# Patient Record
Sex: Female | Born: 1940 | Race: White | Hispanic: No | Marital: Married | Smoking: Never smoker
Health system: Southern US, Community
[De-identification: ages and names within clinical notes are randomized; demographics above are authoritative.]

## PROBLEM LIST (undated history)

## (undated) DIAGNOSIS — M899 Disorder of bone, unspecified: Secondary | ICD-10-CM

## (undated) DIAGNOSIS — T7840XA Allergy, unspecified, initial encounter: Secondary | ICD-10-CM

## (undated) DIAGNOSIS — F329 Major depressive disorder, single episode, unspecified: Secondary | ICD-10-CM

## (undated) DIAGNOSIS — M949 Disorder of cartilage, unspecified: Secondary | ICD-10-CM

## (undated) DIAGNOSIS — R32 Unspecified urinary incontinence: Secondary | ICD-10-CM

## (undated) DIAGNOSIS — E785 Hyperlipidemia, unspecified: Secondary | ICD-10-CM

## (undated) DIAGNOSIS — E119 Type 2 diabetes mellitus without complications: Secondary | ICD-10-CM

## (undated) DIAGNOSIS — M8580 Other specified disorders of bone density and structure, unspecified site: Secondary | ICD-10-CM

## (undated) DIAGNOSIS — E669 Obesity, unspecified: Secondary | ICD-10-CM

## (undated) DIAGNOSIS — F3289 Other specified depressive episodes: Secondary | ICD-10-CM

## (undated) DIAGNOSIS — M170 Bilateral primary osteoarthritis of knee: Secondary | ICD-10-CM

## (undated) DIAGNOSIS — H3530 Unspecified macular degeneration: Secondary | ICD-10-CM

## (undated) DIAGNOSIS — K5790 Diverticulosis of intestine, part unspecified, without perforation or abscess without bleeding: Secondary | ICD-10-CM

## (undated) HISTORY — DX: Type 2 diabetes mellitus without complications: E11.9

## (undated) HISTORY — DX: Allergy, unspecified, initial encounter: T78.40XA

## (undated) HISTORY — DX: Other specified disorders of bone density and structure, unspecified site: M85.80

## (undated) HISTORY — DX: Hyperlipidemia, unspecified: E78.5

## (undated) HISTORY — DX: Diverticulosis of intestine, part unspecified, without perforation or abscess without bleeding: K57.90

## (undated) HISTORY — DX: Major depressive disorder, single episode, unspecified: F32.9

## (undated) HISTORY — DX: Unspecified macular degeneration: H35.30

## (undated) HISTORY — DX: Disorder of bone, unspecified: M89.9

## (undated) HISTORY — DX: Bilateral primary osteoarthritis of knee: M17.0

## (undated) HISTORY — DX: Disorder of cartilage, unspecified: M94.9

## (undated) HISTORY — PX: FRACTURE SURGERY: SHX138

## (undated) HISTORY — DX: Obesity, unspecified: E66.9

## (undated) HISTORY — DX: Unspecified urinary incontinence: R32

## (undated) HISTORY — PX: BREAST SURGERY: SHX581

## (undated) HISTORY — DX: Other specified depressive episodes: F32.89

---

## 1992-12-14 HISTORY — PX: OTHER SURGICAL HISTORY: SHX169

## 1998-08-28 ENCOUNTER — Encounter: Admission: RE | Admit: 1998-08-28 | Discharge: 1998-11-26 | Payer: Self-pay | Admitting: Family Medicine

## 1999-06-02 ENCOUNTER — Other Ambulatory Visit: Admission: RE | Admit: 1999-06-02 | Discharge: 1999-06-02 | Payer: Self-pay | Admitting: *Deleted

## 1999-08-06 ENCOUNTER — Encounter (INDEPENDENT_AMBULATORY_CARE_PROVIDER_SITE_OTHER): Payer: Self-pay

## 1999-08-06 ENCOUNTER — Other Ambulatory Visit: Admission: RE | Admit: 1999-08-06 | Discharge: 1999-08-06 | Payer: Self-pay | Admitting: *Deleted

## 1999-10-03 ENCOUNTER — Other Ambulatory Visit: Admission: RE | Admit: 1999-10-03 | Discharge: 1999-10-03 | Payer: Self-pay | Admitting: *Deleted

## 2000-05-31 ENCOUNTER — Encounter: Admission: RE | Admit: 2000-05-31 | Discharge: 2000-05-31 | Payer: Self-pay | Admitting: *Deleted

## 2000-05-31 ENCOUNTER — Encounter: Payer: Self-pay | Admitting: *Deleted

## 2000-06-09 ENCOUNTER — Other Ambulatory Visit: Admission: RE | Admit: 2000-06-09 | Discharge: 2000-06-09 | Payer: Self-pay | Admitting: *Deleted

## 2000-10-21 ENCOUNTER — Other Ambulatory Visit: Admission: RE | Admit: 2000-10-21 | Discharge: 2000-10-21 | Payer: Self-pay | Admitting: *Deleted

## 2001-06-01 ENCOUNTER — Encounter: Admission: RE | Admit: 2001-06-01 | Discharge: 2001-06-01 | Payer: Self-pay | Admitting: *Deleted

## 2001-06-01 ENCOUNTER — Encounter: Payer: Self-pay | Admitting: *Deleted

## 2001-06-02 ENCOUNTER — Other Ambulatory Visit: Admission: RE | Admit: 2001-06-02 | Discharge: 2001-06-02 | Payer: Self-pay | Admitting: *Deleted

## 2002-06-02 ENCOUNTER — Encounter: Payer: Self-pay | Admitting: *Deleted

## 2002-06-02 ENCOUNTER — Encounter: Admission: RE | Admit: 2002-06-02 | Discharge: 2002-06-02 | Payer: Self-pay | Admitting: *Deleted

## 2002-06-05 ENCOUNTER — Other Ambulatory Visit: Admission: RE | Admit: 2002-06-05 | Discharge: 2002-06-05 | Payer: Self-pay | Admitting: *Deleted

## 2003-06-04 ENCOUNTER — Encounter: Admission: RE | Admit: 2003-06-04 | Discharge: 2003-06-04 | Payer: Self-pay | Admitting: *Deleted

## 2003-06-04 ENCOUNTER — Encounter: Payer: Self-pay | Admitting: *Deleted

## 2003-06-08 ENCOUNTER — Other Ambulatory Visit: Admission: RE | Admit: 2003-06-08 | Discharge: 2003-06-08 | Payer: Self-pay | Admitting: *Deleted

## 2004-06-05 ENCOUNTER — Encounter: Admission: RE | Admit: 2004-06-05 | Discharge: 2004-06-05 | Payer: Self-pay | Admitting: *Deleted

## 2004-06-09 ENCOUNTER — Other Ambulatory Visit: Admission: RE | Admit: 2004-06-09 | Discharge: 2004-06-09 | Payer: Self-pay | Admitting: *Deleted

## 2004-07-17 ENCOUNTER — Encounter: Payer: Self-pay | Admitting: Internal Medicine

## 2004-07-17 LAB — HM COLONOSCOPY

## 2005-06-09 ENCOUNTER — Encounter: Admission: RE | Admit: 2005-06-09 | Discharge: 2005-06-09 | Payer: Self-pay | Admitting: *Deleted

## 2005-06-10 ENCOUNTER — Other Ambulatory Visit: Admission: RE | Admit: 2005-06-10 | Discharge: 2005-06-10 | Payer: Self-pay | Admitting: *Deleted

## 2006-06-11 ENCOUNTER — Other Ambulatory Visit: Admission: RE | Admit: 2006-06-11 | Discharge: 2006-06-11 | Payer: Self-pay | Admitting: Obstetrics & Gynecology

## 2006-06-14 ENCOUNTER — Encounter: Admission: RE | Admit: 2006-06-14 | Discharge: 2006-06-14 | Payer: Self-pay | Admitting: Obstetrics & Gynecology

## 2006-12-14 HISTORY — PX: OTHER SURGICAL HISTORY: SHX169

## 2006-12-17 ENCOUNTER — Ambulatory Visit (HOSPITAL_COMMUNITY): Admission: RE | Admit: 2006-12-17 | Discharge: 2006-12-17 | Payer: Self-pay | Admitting: Chiropractic Medicine

## 2007-06-15 ENCOUNTER — Other Ambulatory Visit: Admission: RE | Admit: 2007-06-15 | Discharge: 2007-06-15 | Payer: Self-pay | Admitting: Obstetrics & Gynecology

## 2007-07-04 ENCOUNTER — Encounter: Admission: RE | Admit: 2007-07-04 | Discharge: 2007-07-04 | Payer: Self-pay | Admitting: Obstetrics & Gynecology

## 2008-04-18 ENCOUNTER — Encounter: Payer: Self-pay | Admitting: Internal Medicine

## 2008-06-21 ENCOUNTER — Other Ambulatory Visit: Admission: RE | Admit: 2008-06-21 | Discharge: 2008-06-21 | Payer: Self-pay | Admitting: Obstetrics & Gynecology

## 2008-07-04 ENCOUNTER — Encounter: Admission: RE | Admit: 2008-07-04 | Discharge: 2008-07-04 | Payer: Self-pay | Admitting: Obstetrics & Gynecology

## 2008-09-29 ENCOUNTER — Encounter: Payer: Self-pay | Admitting: Internal Medicine

## 2009-05-28 ENCOUNTER — Encounter: Payer: Self-pay | Admitting: Internal Medicine

## 2009-07-10 ENCOUNTER — Encounter: Admission: RE | Admit: 2009-07-10 | Discharge: 2009-07-10 | Payer: Self-pay | Admitting: Obstetrics & Gynecology

## 2010-01-01 ENCOUNTER — Encounter: Payer: Self-pay | Admitting: Internal Medicine

## 2010-04-18 ENCOUNTER — Ambulatory Visit: Payer: Self-pay | Admitting: Internal Medicine

## 2010-04-18 DIAGNOSIS — E1169 Type 2 diabetes mellitus with other specified complication: Secondary | ICD-10-CM

## 2010-04-18 DIAGNOSIS — E119 Type 2 diabetes mellitus without complications: Secondary | ICD-10-CM

## 2010-04-18 DIAGNOSIS — Z9189 Other specified personal risk factors, not elsewhere classified: Secondary | ICD-10-CM | POA: Insufficient documentation

## 2010-04-18 DIAGNOSIS — E785 Hyperlipidemia, unspecified: Secondary | ICD-10-CM

## 2010-04-18 DIAGNOSIS — R32 Unspecified urinary incontinence: Secondary | ICD-10-CM

## 2010-04-20 DIAGNOSIS — F320 Major depressive disorder, single episode, mild: Secondary | ICD-10-CM

## 2010-04-20 DIAGNOSIS — M8580 Other specified disorders of bone density and structure, unspecified site: Secondary | ICD-10-CM

## 2010-04-21 ENCOUNTER — Encounter: Payer: Self-pay | Admitting: Internal Medicine

## 2010-04-29 ENCOUNTER — Encounter: Payer: Self-pay | Admitting: Internal Medicine

## 2010-05-19 ENCOUNTER — Encounter: Admission: RE | Admit: 2010-05-19 | Discharge: 2010-05-19 | Payer: Self-pay | Admitting: Internal Medicine

## 2010-05-20 ENCOUNTER — Encounter: Payer: Self-pay | Admitting: Internal Medicine

## 2010-05-28 ENCOUNTER — Ambulatory Visit: Payer: Self-pay | Admitting: Internal Medicine

## 2010-05-28 LAB — CONVERTED CEMR LAB
AST: 36 units/L (ref 0–37)
Alkaline Phosphatase: 78 units/L (ref 39–117)
Bilirubin, Direct: 0.1 mg/dL (ref 0.0–0.3)
Cholesterol: 197 mg/dL (ref 0–200)
Hgb A1c MFr Bld: 6.4 % (ref 4.6–6.5)
Total Protein: 6.4 g/dL (ref 6.0–8.3)
VLDL: 33 mg/dL (ref 0.0–40.0)

## 2010-07-11 ENCOUNTER — Encounter: Admission: RE | Admit: 2010-07-11 | Discharge: 2010-07-11 | Payer: Self-pay | Admitting: Internal Medicine

## 2010-09-02 ENCOUNTER — Ambulatory Visit: Payer: Self-pay | Admitting: Internal Medicine

## 2010-09-03 LAB — CONVERTED CEMR LAB: Hgb A1c MFr Bld: 6.3 % (ref 4.6–6.5)

## 2010-12-02 ENCOUNTER — Ambulatory Visit: Payer: Self-pay | Admitting: Internal Medicine

## 2010-12-02 DIAGNOSIS — M542 Cervicalgia: Secondary | ICD-10-CM | POA: Insufficient documentation

## 2010-12-02 LAB — CONVERTED CEMR LAB: Hgb A1c MFr Bld: 6.1 % (ref 4.6–6.5)

## 2011-01-11 LAB — CONVERTED CEMR LAB
ALT: 25 units/L
ALT: 30 units/L
AST: 25 units/L
AST: 32 units/L
Albumin: 3.4 g/dL
Alkaline Phosphatase: 94 units/L
BUN: 14 mg/dL
BUN: 16 mg/dL
BUN: 17 mg/dL
CO2: 23 meq/L
Chloride: 107 meq/L
Cholesterol: 188 mg/dL
Cholesterol: 193 mg/dL
Creatinine, Ser: 0.7 mg/dL
Glucose, Bld: 114 mg/dL
Glucose, Bld: 97 mg/dL
Glucose, Bld: 99 mg/dL
HCT: 41.9 %
HDL: 47 mg/dL
Hgb A1c MFr Bld: 5.9 %
Hgb A1c MFr Bld: 6.3 %
LDL Cholesterol: 104 mg/dL
MCV: 92.8 fL
Platelets: 257 10*3/uL
Potassium: 4.4 meq/L
Potassium: 4.6 meq/L
RDW: 13 %
Sodium: 13 meq/L
Sodium: 141 meq/L
TSH: 0.96 microintl units/mL
Total Bilirubin: 0.4 mg/dL
Total Bilirubin: 3.4 mg/dL
Triglyceride fasting, serum: 182 mg/dL
Triglyceride fasting, serum: 229 mg/dL
WBC: 6.3 10*3/uL

## 2011-01-15 NOTE — Assessment & Plan Note (Signed)
Summary: 6 WK F/U // # / CD   Vital Signs:  Patient profile:   70 year old female Height:      59 inches (149.86 cm) Weight:      188.8 pounds (85.82 kg) O2 Sat:      96 % on Room air Temp:     97.7 degrees F (36.50 degrees C) oral Pulse rate:   85 / minute BP sitting:   120 / 78  (left arm) Cuff size:   large  Vitals Entered By: Orlan Leavens (May 28, 2010 10:09 AM)  O2 Flow:  Room air CC: 6 week f/u Is Patient Diabetic? Yes Did you bring your meter with you today? No Pain Assessment Patient in pain? no        Primary Care Provider:  Newt Lukes MD  CC:  6 week f/u.  History of Present Illness: here for f/u  1) diabetes -  home cbg log reviewed - has been checking sugars q AM - range 110-160s fasting prior a1c = 6.5 watches diet but always heavy - "[facility] weight watchers" had been to nutrtionist and has 2 more appts  2) dyslipidemia - reports compliance with ongoing medical treatment and no changes in medication dose or frequency. denies adverse side effects related to current therapy.  no muscle or GI upset  3) depression - reports compliance with ongoing medical treatment and no changes in medication dose or frequency. denies adverse side effects related to current therapy. no si/hi  4) osteopenia -reports compliance with ongoing medical treatment and no changes in medication dose or frequency. denies adverse side effects related to current therapy.    Clinical Review Panels:  Prevention   Last Mammogram:  No specific mammographic evidence of malignancy.  Assessment: BIRADS 1. (07/10/2009)   Last Pap Smear:  Interpretation Result:Negative for intraepithelial Lesion or Malignancy.    (05/14/2009)   [facility]:  [facility]  Results: Diverticulosis.        (07/17/2004)  Immunizations   Last Tetanus Booster:  Tdap (04/18/2010)   Last Flu Vaccine:  Historical (09/13/2009)   Last Pneumovax:  Historical (07/18/2008)   Last Zoster Vaccine:  Zostavax (09/13/2008)  Lipid Management   Cholesterol:  188 (05/28/2009)   LDL (bad choesterol):  95 (05/28/2009)   HDL (good cholesterol):  47 (05/28/2009)   Triglycerides:  229 (05/28/2009)  Diabetes Management   HgBA1C:  6.3 (05/28/2009)   Creatinine:  0.7 (01/01/2010)   Last Flu Vaccine:  Historical (09/13/2009)   Last Pneumovax:  Historical (07/18/2008)  CBC   WBC:  6.3 (09/29/2008)   RBC:  4.51 (09/29/2008)   Hgb:  13.5 (09/29/2008)   Hct:  41.9 (09/29/2008)   Platelets:  257 (09/29/2008)   MCV  92.8 (09/29/2008)   RDW  13.0 (09/29/2008)  Complete Metabolic Panel   Glucose:  99 (01/01/2010)   Sodium:  141 (01/01/2010)   Potassium:  4.4 (01/01/2010)   Chloride:  107 (01/01/2010)   CO2:  23 (01/01/2010)   BUN:  17 (01/01/2010)   Creatinine:  0.7 (01/01/2010)   Albumin:  3.4 (01/01/2010)   Total Protein:  7.1 (01/01/2010)   Calcium:  9.9 (01/01/2010)   Total Bili:  3.4 (01/01/2010)   Alk Phos:  75 (01/01/2010)   SGPT (ALT):  30 (01/01/2010)   SGOT (AST):  33 (01/01/2010)   Current Medications (verified): 1)  Actonel 5 Mg Tabs (Risedronate Sodium) .... Take 1 By Mouth Qd 2)  Zoloft 50  Mg Tabs (Sertraline Hcl) .... Take 1 By Mouth Qd 3)  Zocor 20 Mg Tabs (Simvastatin) .... Take 1 By Mouth Once Daily 4)  I-Caps .... Take 1 By Mouth Qd 5)  Multivitamins  Tabs (Multiple Vitamin) .... Take 1 By Mouth Qd 6)  Caltrate 600+d 600-400 Mg-Unit Tabs (Calcium Carbonate-Vitamin D) .... Take T Three Times A Day 7)  Vitamin D 2000 Unit Caps (Cholecalciferol) .... Take 1 Po Once Daily  Allergies (verified): 1)  ! Sulfa 2)  ! Penicillin 3)  ! * Lamisil  Past History:  Past Medical History: Hyperlipidemia obesity diabetes mellitus, type 2 depression osteopenia  MD roster: GI : [facility] - [facility] gyn -miller - every aug  Family History: Reviewed history from 04/29/2010 and no changes required. Family History Diabetes 1st degree relative  (parent, grandparent) Family History High cholesterol (parent) Family History Hypertension (parent) Heart disease (parent, grandparent, other relative) mom - age 13 - prot S defic 1 daughter with M.S.  Social History: Reviewed history from 04/18/2010 and no changes required. Never Smoked lives with female partner (annette hicks) and 68 yo son works as Engineer, structural  Review of Systems  The patient denies weight loss, chest pain, and syncope.         also see HPI above. I have reviewed all other systems and they were negative.   Physical Exam  General:  overweight-appearing.  alert, well-developed, well-nourished, and cooperative to examination.    Lungs:  normal respiratory effort, no intercostal retractions or use of accessory muscles; normal breath sounds bilaterally - no crackles and no wheezes.    Heart:  normal rate, regular rhythm, no murmur, and no rub. BLE without edema. normal DP pulses and normal cap refill in all 4 extremities    Abdomen:  soft, non-tender, normal bowel sounds, no distention; no masses and no appreciable hepatomegaly or splenomegaly.   Psych:  Oriented X3, memory intact for recent and remote, normally interactive, good eye contact, not anxious appearing, not depressed appearing, and not agitated.      Impression & Recommendations:  Problem # 1:  DIABETES MELLITUS, TYPE II (ICD-250.00) dx confirmed - to cont nutrition/dm education as begun check labs now initiate tx -advised on risk/benefit and poss SE - to call if problems Her updated medication list for this problem includes:    Metformin Hcl 500 Mg Xr24h-tab (Metformin hcl) .Marland Kitchen... 1 by mouth two times a day  Orders: Prescription Created Electronically [id number]) TLB-A1C / Hgb A1C (Glycohemoglobin) (83036-A1C) TLB-Microalbumin/Creat Ratio, Urine (82043-MALB)  Labs Reviewed: Creat: 0.7 (01/01/2010)    Reviewed HgBA1c results: 6.3 (05/28/2009)  5.9 (09/29/2008)  Problem # 2:   HYPERLIPIDEMIA (ICD-272.4)  Her updated medication list for this problem includes:    Zocor 20 Mg Tabs (Simvastatin) .Marland Kitchen... Take 1 by mouth once daily  Orders: TLB-Lipid Panel (80061-LIPID)  Labs Reviewed: SGOT: 33 (01/01/2010)   SGPT: 30 (01/01/2010)   HDL:47 (05/28/2009), 53 (09/29/2008)  LDL:95 (05/28/2009), 104 (47/82/9562)  Chol:188 (05/28/2009), 193 (09/29/2008)  Trig:229 (05/28/2009), 182 (09/29/2008)  Problem # 3:  OSTEOPENIA (ICD-733.90)  Her updated medication list for this problem includes:    Actonel 5 Mg Tabs (Risedronate sodium) .Marland Kitchen... Take 1 by mouth qd  Orders: T-Vitamin D (25-Hydroxy) (13086-57846)  Discussed medication use, applications of heat or ice, and exercises.    Time spent with patient 45 minutes, more than 50% of this time was spent counseling patient on diabetes, obesity and problems concerning the need for nutrition education and exercise  Problem # 4:  OBESITY, UNSPECIFIED (ICD-278.00)  Ht: 59 (05/28/2010)   Wt: 188.8 (05/28/2010)   BMI: 38.19 (04/18/2010)  Ht: 59 (04/18/2010)   Wt: 188.4 (04/18/2010)   BMI: 38.19 (04/18/2010)  Complete Medication List: 1)  Actonel 5 Mg Tabs (Risedronate sodium) .... Take 1 by mouth qd 2)  Zoloft 50 Mg Tabs (Sertraline hcl) .... Take 1 by mouth qd 3)  Zocor 20 Mg Tabs (Simvastatin) .... Take 1 by mouth once daily 4)  I-caps  .... Take 1 by mouth qd 5)  Multivitamins Tabs (Multiple vitamin) .... Take 1 by mouth qd 6)  Caltrate 600+d 600-400 Mg-unit Tabs (Calcium carbonate-vitamin d) .... Take t three times a day 7)  Vitamin D 2000 Unit Caps (Cholecalciferol) .... Take 1 po once daily 8)  Metformin Hcl 500 Mg Xr24h-tab (Metformin hcl) .Marland Kitchen.. 1 by mouth two times a day  Other Orders: TLB-Hepatic/Liver Function Pnl (80076-HEPATIC)  Patient Instructions: 1)  it was good to see you today. 2)  prior labs reviewed and more test(s) ordered today - your results will be posted on the phone tree for review in 48-72 hours  from the time of test completion; call [phone] and enter your 9 digit MRN (listed above on this page, just below your name); if any changes need to be made or there are abnormal results, you will be contacted directly.  3)  start metformin as discussed for diabetes - your prescription has been electronically submitted to your pharmacy. Please take as directed. Contact our office if you believe you're having problems with the medication(s).  4)  continue to monitor your sugars each AM before breakfast - call if any sugars over 200 - 5)  Please schedule a follow-up appointment in 3 months, sooner if problems.  Prescriptions: METFORMIN HCL 500 MG XR24H-TAB (METFORMIN HCL) 1 by mouth two times a day  #60 x 3   Entered and Authorized by:   Newt Lukes MD   Signed by:   Newt Lukes MD on 05/28/2010   Method used:   Electronically to        [facility]. [phone]* (retail)       [address]       [place], [place]  [address]       Ph: [phone]       Fax: [phone]   RxID:   [id number]

## 2011-01-15 NOTE — Assessment & Plan Note (Signed)
Summary: new / bcbs / # cd   Vital Signs:  Patient profile:   70 year old female Height:      59 inches (149.86 cm) Weight:      188.4 pounds (85.64 kg) BMI:     38.19 O2 Sat:      97 % on Room air Temp:     97.6 degrees F (36.44 degrees C) oral Pulse rate:   93 / minute BP sitting:   120 / 68  (left arm) Cuff size:   regular  Vitals Entered By: Orlan Leavens (Apr 18, 2010 3:12 PM)  O2 Flow:  Room air CC: New patient Is Patient Diabetic? No Pain Assessment Patient in pain? no        Primary Care Provider:  Newt Lukes MD  CC:  New patient.  History of Present Illness: new pt to me and our practice, here ot estt care prev followed with tisovek at [facility]  1) hyperglycemia - ?DM  has been checking sugars at home - range 110-160s in am fasting prior a1c = 6.5 watches diet but always heavy - "[facility] weight watchers"  2) dyslipidemia - reports compliance with ongoing medical treatment and no changes in medication dose or frequency. denies adverse side effects related to current therapy.  no muscle or GI upset  3) depression - reports compliance with ongoing medical treatment and no changes in medication dose or frequency. denies adverse side effects related to current therapy.   no si/hi  4) osteopenia - last bone density 2 years? - reports compliance with ongoing medical treatment and no changes in medication dose or frequency. denies adverse side effects related to current therapy.    Preventive Screening-Counseling & Management  Alcohol-Tobacco     Alcohol drinks/day: 0     Alcohol Counseling: not indicated; patient does not drink     Smoking Status: never     Tobacco Counseling: not indicated; no tobacco use  Caffeine-Diet-Exercise     Nutrition Referrals: yes     Exercise Counseling: to improve exercise regimen     Depression Counseling: not indicated; screening negative for depression  Safety-Violence-Falls     Seat Belt Counseling: not indicated;  patient wears seat belts     Helmet Counseling: not indicated; patient wears helmet when riding bicycle/motocycle     Firearm Counseling: not applicable     Violence Counseling: not indicated; no violence risk noted     Sexual Abuse Counseling: no     Fall Risk Counseling: not indicated; no significant falls noted  Clinical Review Panels:  Prevention   Last Mammogram:  No specific mammographic evidence of malignancy.  Assessment: BIRADS 1. (07/10/2009)   Last Pap Smear:  Interpretation Result:Negative for intraepithelial Lesion or Malignancy.    (05/14/2009)   Last Colonoscopy:  Location:  [facility].  Results: Diverticulosis.        (07/17/2004)  Immunizations   Last Tetanus Booster:  Tdap (04/18/2010)   Last Flu Vaccine:  Historical (09/13/2009)   Last Pneumovax:  Historical (07/18/2008)   Last Zoster Vaccine:  Zostavax (09/13/2008)   Current Medications (verified): 1)  Actonel 5 Mg Tabs (Risedronate Sodium) .... Take 1 By Mouth Qd 2)  Zoloft 50 Mg Tabs (Sertraline Hcl) .... Take 1 By Mouth Qd 3)  Zocor 20 Mg Tabs (Simvastatin) .... Take 1 By Mouth Once Daily 4)  I-Caps .... Take 1 By Mouth Qd 5)  Multivitamins  Tabs (Multiple Vitamin) .... Take 1 By Mouth  Qd 6)  Caltrate 600+d 600-400 Mg-Unit Tabs (Calcium Carbonate-Vitamin D) .... Take T Three Times A Day 7)  Vitamin D 2000 Unit Caps (Cholecalciferol) .... Take 1 Po Once Daily  Allergies (verified): 1)  ! Sulfa 2)  ! Penicillin 3)  ! * Lamisil  Past History:  Past Medical History: Hyperlipidemia obesity diabetes, borderine depression osteopenia  MD rooster: GI : [facility] - [facility]orf gyn -[facility] - every aug  Past Surgical History: Left wrist surgery in 2008 with Dr. Yisroel Ramming Right ankle  Family History: Family History Diabetes 1st degree relative (parent, grandparent) Family History High cholesterol (parent) Family History Hypertension (parent) Heart disease (parent, grandparent, other  relative) mom - age 69 - prot S defic  Social History: Never Smoked lives with female partner (annette hicks) and 35 yo son works as Engineer, structural Smoking Status:  never  Review of Systems       see HPI above. I have reviewed all other systems and they were negative.   Physical Exam  General:  overweight-appearing.  alert, well-developed, well-nourished, and cooperative to examination.    Eyes:  vision grossly intact; pupils equal, round and reactive to light.  conjunctiva and lids normal.    Ears:  normal pinnae bilaterally, without erythema, swelling, or tenderness to palpation. TMs clear, without effusion, or cerumen impaction. Hearing grossly normal bilaterally  Mouth:  teeth and gums in good repair; mucous membranes moist, without lesions or ulcers. oropharynx clear without exudate, erythema.  Neck:  thick, supple, full ROM, no masses, no thyromegaly; no thyroid nodules or tenderness. no JVD or carotid bruits.   Lungs:  normal respiratory effort, no intercostal retractions or use of accessory muscles; normal breath sounds bilaterally - no crackles and no wheezes.    Heart:  normal rate, regular rhythm, no murmur, and no rub. BLE without edema. normal DP pulses and normal cap refill in all 4 extremities    Abdomen:  soft, non-tender, normal bowel sounds, no distention; no masses and no appreciable hepatomegaly or splenomegaly.   Genitalia:  defer gyn Msk:  No deformity or scoliosis noted of thoracic or lumbar spine.   Neurologic:  alert & oriented X3 and cranial nerves II-XII symetrically intact.  strength normal in all extremities, sensation intact to light touch, and gait normal. speech fluent without dysarthria or aphasia; follows commands with good comprehension.  Skin:  no rashes, vesicles, ulcers, or erythema. No nodules or irregularity to palpation.  Psych:  Oriented X3, memory intact for recent and remote, normally interactive, good eye contact, not anxious appearing,  not depressed appearing, and not agitated.      Impression & Recommendations:  Problem # 1:  DIABETES MELLITUS, TYPE II (ICD-250.00) meets criteria by review of home fastong home cbgs -  send for prior recorsd - check a1x next OV - refer for nutrition eval and education  Problem # 2:  OBESITY, UNSPECIFIED (ICD-278.00)  Ht: 59 (04/18/2010)   Wt: 188.4 (04/18/2010)   BMI: 38.19 (04/18/2010)  Problem # 3:  HYPERLIPIDEMIA (ICD-272.4) send for prior records and labs - cont same until that time  Her updated medication list for this problem includes:    Zocor 20 Mg Tabs (Simvastatin) .Marland Kitchen... Take 1 by mouth once daily  Problem # 4:  DEPRESSION (ICD-311) Assessment: Unchanged  Her updated medication list for this problem includes:    Zoloft 50 Mg Tabs (Sertraline hcl) .Marland Kitchen... Take 1 by mouth qd  Problem # 5:  OSTEOPENIA (ICD-733.90) send for records - Her  updated medication list for this problem includes:    Actonel 5 Mg Tabs (Risedronate sodium) .Marland Kitchen... Take 1 by mouth qd  Discussed medication use, applications of heat or ice, and exercises.    Time spent with patient 45 minutes, more than 50% of this time was spent counseling patient on diabetes, obesity and problems concerning the need for nutrition education and exercise  Complete Medication List: 1)  Actonel 5 Mg Tabs (Risedronate sodium) .... Take 1 by mouth qd 2)  Zoloft 50 Mg Tabs (Sertraline hcl) .... Take 1 by mouth qd 3)  Zocor 20 Mg Tabs (Simvastatin) .... Take 1 by mouth once daily 4)  I-caps  .... Take 1 by mouth qd 5)  Multivitamins Tabs (Multiple vitamin) .... Take 1 by mouth qd 6)  Caltrate 600+d 600-400 Mg-unit Tabs (Calcium carbonate-vitamin d) .... Take t three times a day 7)  Vitamin D 2000 Unit Caps (Cholecalciferol) .... Take 1 po once daily  Other Orders: Tdap => 45yrs IM (08657) Admin 1st Vaccine (84696) Nutrition Referral (Nutrition)  Patient Instructions: 1)  it was good to see you today. 2)  will send  for record from [facility]k to review -  3)  we'll make referral to nutritionist for weight and diabetes education. Our office will contact you regarding this appointment once made.  4)  continue to monitor your sugars each AM before breakfast - call if any sugars over 200 - 5)  Please schedule a follow-up appointment in 6 weeks, sooner if problems.    Immunization History:  Influenza Immunization History:    Influenza:  historical (09/13/2009)  Pneumovax Immunization History:    Pneumovax:  historical (07/18/2008)  Zostavax History:    Zostavax # 1:  zostavax (09/13/2008)  Immunizations Administered:  Tetanus Vaccine:    Vaccine Type: Tdap    Site: left deltoid    Mfr: [employer]oSmithK[employer]    Dose: 0.5 ml    Route: IM    Given by: Orlan Leavens    Exp. Date: 03/07/2012    Lot #: [id number]    VIS given: 04/18/10    Mammogram  Procedure date:  07/10/2009  Findings:      No specific mammographic evidence of malignancy.  Assessment: BIRADS 1.   Pap Smear  Procedure date:  05/14/2009  Findings:      Interpretation Result:Negative for intraepithelial Lesion or Malignancy.     Colonoscopy  Procedure date:  07/17/2004  Findings:      Location:  [facility].  Results: Diverticulosis.

## 2011-01-15 NOTE — Letter (Signed)
Summary: Pt's Hx/[facility]  Pt's Hx/[facility]   Imported By: Sherian Rein 05/05/2010 15:15:11  _____________________________________________________________________  External Attachment:    Type:   Image     Comment:   External Document

## 2011-01-15 NOTE — Letter (Signed)
Summary: [facility]  [facility]   Imported By: Sherian Rein 05/05/2010 15:10:15  _____________________________________________________________________  External Attachment:    Type:   Image     Comment:   External Document

## 2011-01-15 NOTE — Assessment & Plan Note (Signed)
Summary: 3 mo rov /nws   Vital Signs:  Patient profile:   70 year old female Height:      59 inches (149.86 cm) Weight:      190.4 pounds (86.55 kg) O2 Sat:      95 % on Room air Temp:     98.4 degrees F (36.89 degrees C) oral Pulse rate:   76 / minute BP sitting:   122 / 76  (left arm) Cuff size:   large  Vitals Entered By: Orlan Leavens RMA (December 02, 2010 3:53 PM)  O2 Flow:  Room air CC: 3 month follow-up Is Patient Diabetic? Yes Did you bring your meter with you today? No Pain Assessment Patient in pain? no        Primary Care Devinn Voshell:  Newt Lukes MD  CC:  3 month follow-up.  History of Present Illness: here for f/u  1) diabetes -  home cbg log reviewed - has been checking sugars q AM - range 80s-120s fasting watches diet but always heavy - "life member weight watchers" had been to nutrtionist and eating more veggies reports compliance with ongoing medical treatment and no changes in medication dose or frequency. denies adverse side effects related to current therapy. ?diarrhea since starting atelvia vs metfor  2) dyslipidemia - reports compliance with ongoing medical treatment and no changes in medication dose or frequency. denies adverse side effects related to current therapy.  no muscle or GI upset  3) depression - reports compliance with ongoing medical treatment and no changes in medication dose or frequency. denies adverse side effects related to current therapy. no si/hi  4) osteopenia -reports compliance with ongoing medical treatment and changes from actonel to atelvia. denies adverse side effects related to current therapy.    Clinical Review Panels:  Prevention   Last Mammogram:  ASSESSMENT: Negative - BI-RADS 1^MM DIGITAL SCREENING (07/11/2010)   Last Pap Smear:  Interpretation Result:Negative for intraepithelial Lesion or Malignancy.    (05/14/2009)   Last Colonoscopy:  Location:  [facility].  Results: Diverticulosis.          (07/17/2004)  Diabetes Management   HgBA1C:  6.3 (09/02/2010)   Creatinine:  0.7 (01/01/2010)   Last Flu Vaccine:  Fluvax 3+ (09/02/2010)   Last Pneumovax:  Historical (07/18/2008)  CBC   WBC:  6.3 (09/29/2008)   RBC:  4.51 (09/29/2008)   Hgb:  13.5 (09/29/2008)   Hct:  41.9 (09/29/2008)   Platelets:  257 (09/29/2008)   MCV  92.8 (09/29/2008)   RDW  13.0 (09/29/2008)  Complete Metabolic Panel   Glucose:  99 (01/01/2010)   Sodium:  141 (01/01/2010)   Potassium:  4.4 (01/01/2010)   Chloride:  107 (01/01/2010)   CO2:  23 (01/01/2010)   BUN:  17 (01/01/2010)   Creatinine:  0.7 (01/01/2010)   Albumin:  3.6 (05/28/2010)   Total Protein:  6.4 (05/28/2010)   Calcium:  9.9 (01/01/2010)   Total Bili:  0.7 (05/28/2010)   Alk Phos:  78 (05/28/2010)   SGPT (ALT):  28 (05/28/2010)   SGOT (AST):  36 (05/28/2010)   Current Medications (verified): 1)  Zoloft 50 Mg Tabs (Sertraline Hcl) .... Take 1 By Mouth Qd 2)  Zocor 20 Mg Tabs (Simvastatin) .... Take 1 By Mouth Once Daily 3)  I-Caps .... Take 1 By Mouth Qd 4)  Multivitamins  Tabs (Multiple Vitamin) .... Take 1 By Mouth Qd 5)  Caltrate 600+d 600-400 Mg-Unit Tabs (Calcium Carbonate-Vitamin D) .... Take T  Three Times A Day 6)  Vitamin D 2000 Unit Caps (Cholecalciferol) .... Take 1 Po Once Daily 7)  Metformin Hcl 500 Mg Xr24h-Tab (Metformin Hcl) .Marland Kitchen.. 1 By Mouth Two Times A Day 8)  Freestyle Test  Strp (Glucose Blood) .... Check Blood Sugar Two Times A Day 9)  Freestyle Lancets  Misc (Lancets) .... Use Check Blood Sugar Two Times A Day 10)  Atelvia 35 Mg Tbec (Risedronate Sodium) .... Take 1 Once A Week 11)  Advil 200 Mg Tabs (Ibuprofen) .... Use As Needed  Allergies (verified): 1)  ! Sulfa 2)  ! Penicillin 3)  ! * Lamisil  Past History:  Past Medical History: Hyperlipidemia obesity diabetes mellitus, type 2 depression   osteopenia  MD roster:  GI : brodie ortho - daldorf gyn - miller - every aug  Review of  Systems  The patient denies weight loss, chest pain, headaches, and abdominal pain.         c/o right shoulder pain, better lifting UE over head  Physical Exam  General:  overweight-appearing.  alert, well-developed, well-nourished, and cooperative to examination.    Lungs:  normal respiratory effort, no intercostal retractions or use of accessory muscles; normal breath sounds bilaterally - no crackles and no wheezes.    Heart:  normal rate, regular rhythm, no murmur, and no rub. BLE without edema. Neurologic:  alert & oriented X3 and cranial nerves II-XII symetrically intact.  strength normal in all extremities, sensation intact to light touch, and gait normal. speech fluent without dysarthria or aphasia; follows commands with good comprehension.    Impression & Recommendations:  Problem # 1:  DIABETES MELLITUS, TYPE II (ICD-250.00)  Her updated medication list for this problem includes:    Metformin Hcl 500 Mg Xr24h-tab (Metformin hcl) .Marland Kitchen... 1 by mouth two times a day  Orders: TLB-A1C / Hgb A1C (Glycohemoglobin) (83036-A1C)  med tx begun 05/2010 - recheck labs now ?diarrhea from this med or atelvia - consider alt med if felt to be metformin related  Labs Reviewed: Creat: 0.7 (01/01/2010)    Reviewed HgBA1c results: 6.3 (09/02/2010)  6.4 (05/28/2010)  Problem # 2:  OSTEOPENIA (ICD-733.90)  med change by gyn reviewed - ?diarrhea SE - monitor The following medications were removed from the medication list:    Actonel 5 Mg Tabs (Risedronate sodium) .Marland Kitchen... Take 1 by mouth qd Her updated medication list for this problem includes:    Atelvia 35 Mg Tbec (Risedronate sodium) .Marland Kitchen... Take 1 once a week  Discussed medication use, applications of heat or ice, and exercises.   Problem # 3:  NECK PAIN (ICD-723.1) suspect right side cervical bulge with right shoulder pain improved by lifting arm up to head discussed options for tx - pt prefers coserv mgmt - NSAIDs - avoid  chiropractor consider PT or Nsurg eval/tx Her updated medication list for this problem includes:    Advil 200 Mg Tabs (Ibuprofen) ..... Use as needed  Complete Medication List: 1)  Zoloft 50 Mg Tabs (Sertraline hcl) .... Take 1 by mouth qd 2)  Zocor 20 Mg Tabs (Simvastatin) .... Take 1 by mouth once daily 3)  I-caps  .... Take 1 by mouth qd 4)  Multivitamins Tabs (Multiple vitamin) .... Take 1 by mouth qd 5)  Caltrate 600+d 600-400 Mg-unit Tabs (Calcium carbonate-vitamin d) .... Take t three times a day 6)  Vitamin D 2000 Unit Caps (Cholecalciferol) .... Take 1 po once daily 7)  Metformin Hcl 500 Mg Xr24h-tab (Metformin hcl) .Marland KitchenMarland KitchenMarland Kitchen 1  by mouth two times a day 8)  Freestyle Test Strp (Glucose blood) .... Check blood sugar two times a day 9)  Freestyle Lancets Misc (Lancets) .... Use check blood sugar two times a day 10)  Atelvia 35 Mg Tbec (Risedronate sodium) .... Take 1 once a week 11)  Advil 200 Mg Tabs (Ibuprofen) .... Use as needed  Patient Instructions: 1)  it was good to see you today. 2)  test(s) ordered today - your results will be posted on the phone tree for review in 48-72 hours from the time of test completion; call [phone] and enter your 9 digit MRN (listed above on this page, just below your name); if any changes need to be made or there are abnormal results, you will be contacted directly.  3)  samples of atelvia provided today -  4)  call if neck/arm pain is getting worse rather than better to consider physical therapy or neurosurg evaluation for other treatment options 5)  continue to watch your weight, exercise and diet 6)  Please schedule a follow-up appointment in 3 months to monitor diabetes, sooner if problems.    Orders Added: 1)  TLB-A1C / Hgb A1C (Glycohemoglobin) [83036-A1C] 2)  Est. Patient Level IV [09811]

## 2011-01-15 NOTE — Assessment & Plan Note (Signed)
Summary: 3 MO FU/LD   Vital Signs:  Patient profile:   70 year old female Height:      59 inches (149.86 cm) Weight:      189.9 pounds (86.32 kg) O2 Sat:      95 % on Room air Temp:     98.3 degrees F (36.83 degrees C) oral Pulse rate:   84 / minute BP sitting:   124 / 82  (left arm) Cuff size:   regular  Vitals Entered By: Orlan Leavens RMA (September 02, 2010 3:55 PM)  O2 Flow:  Room air CC: 3 MONTH FOLLOW-UP Is Patient Diabetic? Yes Did you bring your meter with you today? Yes Pain Assessment Patient in pain? no      Comments NEED REFILL ON MEDS ASLO WANT FLU SHOT   Primary Care Provider:  Newt Lukes MD  CC:  3 MONTH FOLLOW-UP.  History of Present Illness: here for f/u  1) diabetes -  home cbg log reviewed - has been checking sugars q AM - range 90-120ss fasting prior a1c = 6.5 watches diet but always heavy - "life member weight watchers" had been to nutrtionist and eating more veggies  2) dyslipidemia - reports compliance with ongoing medical treatment and no changes in medication dose or frequency. denies adverse side effects related to current therapy.  no muscle or GI upset  3) depression - reports compliance with ongoing medical treatment and no changes in medication dose or frequency. denies adverse side effects related to current therapy. no si/hi  4) osteopenia -reports compliance with ongoing medical treatment and no changes in medication dose or frequency. denies adverse side effects related to current therapy.    Clinical Review Panels:  Prevention   Last Mammogram:  ASSESSMENT: Negative - BI-RADS 1^MM DIGITAL SCREENING (07/11/2010)   Last Pap Smear:  Interpretation Result:Negative for intraepithelial Lesion or Malignancy.    (05/14/2009)   Last Colonoscopy:  Location:  [facility].  Results: Diverticulosis.        (07/17/2004)  Immunizations   Last Tetanus Booster:  Tdap (04/18/2010)   Last Flu Vaccine:  Fluvax 3+  (09/02/2010)   Last Pneumovax:  Historical (07/18/2008)   Last Zoster Vaccine:  Zostavax (09/13/2008)  Lipid Management   Cholesterol:  197 (05/28/2010)   LDL (bad choesterol):  107 (05/28/2010)   HDL (good cholesterol):  57.10 (05/28/2010)   Triglycerides:  229 (05/28/2009)  Diabetes Management   HgBA1C:  6.4 (05/28/2010)   Creatinine:  0.7 (01/01/2010)   Last Flu Vaccine:  Fluvax 3+ (09/02/2010)   Last Pneumovax:  Historical (07/18/2008)  CBC   WBC:  6.3 (09/29/2008)   RBC:  4.51 (09/29/2008)   Hgb:  13.5 (09/29/2008)   Hct:  41.9 (09/29/2008)   Platelets:  257 (09/29/2008)   MCV  92.8 (09/29/2008)   RDW  13.0 (09/29/2008)  Complete Metabolic Panel   Glucose:  99 (01/01/2010)   Sodium:  141 (01/01/2010)   Potassium:  4.4 (01/01/2010)   Chloride:  107 (01/01/2010)   CO2:  23 (01/01/2010)   BUN:  17 (01/01/2010)   Creatinine:  0.7 (01/01/2010)   Albumin:  3.6 (05/28/2010)   Total Protein:  6.4 (05/28/2010)   Calcium:  9.9 (01/01/2010)   Total Bili:  0.7 (05/28/2010)   Alk Phos:  78 (05/28/2010)   SGPT (ALT):  28 (05/28/2010)   SGOT (AST):  36 (05/28/2010)   Current Medications (verified): 1)  Actonel 5 Mg Tabs (Risedronate Sodium) .... Take 1 By Mouth  Qd 2)  Zoloft 50 Mg Tabs (Sertraline Hcl) .... Take 1 By Mouth Qd 3)  Zocor 20 Mg Tabs (Simvastatin) .... Take 1 By Mouth Once Daily 4)  I-Caps .... Take 1 By Mouth Qd 5)  Multivitamins  Tabs (Multiple Vitamin) .... Take 1 By Mouth Qd 6)  Caltrate 600+d 600-400 Mg-Unit Tabs (Calcium Carbonate-Vitamin D) .... Take T Three Times A Day 7)  Vitamin D 2000 Unit Caps (Cholecalciferol) .... Take 1 Po Once Daily 8)  Metformin Hcl 500 Mg Xr24h-Tab (Metformin Hcl) .Marland Kitchen.. 1 By Mouth Two Times A Day  Allergies (verified): 1)  ! Sulfa 2)  ! Penicillin 3)  ! * Lamisil  Past History:  Past Medical History: Hyperlipidemia obesity diabetes mellitus, type 2 depression  osteopenia  MD roster: GI : brodie ortho -  daldorf gyn -miller - every aug  Social History: Never Smoked lives with female partner (annette hicks) and 28 yo son works as Engineer, structural in school system  Review of Systems  The patient denies fever, chest pain, syncope, and headaches.    Physical Exam  General:  overweight-appearing.  alert, well-developed, well-nourished, and cooperative to examination.    Lungs:  normal respiratory effort, no intercostal retractions or use of accessory muscles; normal breath sounds bilaterally - no crackles and no wheezes.    Heart:  normal rate, regular rhythm, no murmur, and no rub. BLE without edema.   Impression & Recommendations:  Problem # 1:  DIABETES MELLITUS, TYPE II (ICD-250.00)  med tx begun 05/2010 - recheck labs now Her updated medication list for this problem includes:    Metformin Hcl 500 Mg Xr24h-tab (Metformin hcl) .Marland Kitchen... 1 by mouth two times a day  Orders: TLB-A1C / Hgb A1C (Glycohemoglobin) (83036-A1C)  Labs Reviewed: Creat: 0.7 (01/01/2010)    Reviewed HgBA1c results: 6.4 (05/28/2010)  6.3 (05/28/2009)  Problem # 2:  HYPERLIPIDEMIA (ICD-272.4)  Her updated medication list for this problem includes:    Zocor 20 Mg Tabs (Simvastatin) .Marland Kitchen... Take 1 by mouth once daily  Labs Reviewed: SGOT: 36 (05/28/2010)   SGPT: 28 (05/28/2010)   HDL:57.10 (05/28/2010), 47 (05/28/2009)  LDL:107 (05/28/2010), 95 (04/54/0981)  Chol:197 (05/28/2010), 188 (05/28/2009)  Trig:165.0 (05/28/2010), 229 (05/28/2009)  Problem # 3:  OBESITY, UNSPECIFIED (ICD-278.00)  Ht: 59 (05/28/2010)   Wt: 188.8 (05/28/2010)   BMI: 38.19 (04/18/2010)  Ht: 59 (04/18/2010)   Wt: 188.4 (04/18/2010)   BMI: 38.19 (04/18/2010)  Ht: 59 (09/02/2010)   Wt: 189.9 (09/02/2010)   BMI: 38.19 (04/18/2010)  Problem # 4:  DEPRESSION (ICD-311)  Her updated medication list for this problem includes:    Zoloft 50 Mg Tabs (Sertraline hcl) .Marland Kitchen... Take 1 by mouth qd  Discussed treatment options, including  trial of antidpressant medication. Will refer to behavioral health. Follow-up call in in 24-48 hours and recheck in 2 weeks, sooner as needed. Patient agrees to call if any worsening of symptoms or thoughts of doing harm arise. Verified that the patient has no suicidal ideation at this time.   Complete Medication List: 1)  Actonel 5 Mg Tabs (Risedronate sodium) .... Take 1 by mouth qd 2)  Zoloft 50 Mg Tabs (Sertraline hcl) .... Take 1 by mouth qd 3)  Zocor 20 Mg Tabs (Simvastatin) .... Take 1 by mouth once daily 4)  I-caps  .... Take 1 by mouth qd 5)  Multivitamins Tabs (Multiple vitamin) .... Take 1 by mouth qd 6)  Caltrate 600+d 600-400 Mg-unit Tabs (Calcium carbonate-vitamin d) .... Take t three times  a day 7)  Vitamin D 2000 Unit Caps (Cholecalciferol) .... Take 1 po once daily 8)  Metformin Hcl 500 Mg Xr24h-tab (Metformin hcl) .Marland Kitchen.. 1 by mouth two times a day 9)  Freestyle Test Strp (Glucose blood) .... Check blood sugar two times a day 10)  Freestyle Lancets Misc (Lancets) .... Use check blood sugar two times a day  Other Orders: Admin 1st Vaccine (98119) Flu Vaccine 20yrs + (14782)  Patient Instructions: 1)  it was good to see you today. 2)  test(s) ordered today - your results will be posted on the phone tree for review in 48-72 hours from the time of test completion; call [phone] and enter your 9 digit MRN (listed above on this page, just below your name); if any changes need to be made or there are abnormal results, you will be contacted directly.  3)  refills on medications as dicussed - 4)  continue to watch your weight, exercise and diet 5)  Please schedule a follow-up appointment in 3 months to monitor diabetes, sooner if problems.  Prescriptions: FREESTYLE LANCETS  MISC (LANCETS) use check blood sugar two times a day  #60 x 6   Entered by:   Orlan Leavens RMA   Authorized by:   Newt Lukes MD   Signed by:   Orlan Leavens RMA on 09/02/2010   Method used:    Electronically to        [facility] [phone]* (retail)       [address]       [place], [place]  [address]       Ph: [phone]       Fax: [phone]   RxID:   [id number] FREESTYLE TEST  STRP (GLUCOSE BLOOD) check blood sugar two times a day  #60 x 6   Entered by:   Orlan Leavens RMA   Authorized by:   Newt Lukes MD   Signed by:   Orlan Leavens RMA on 09/02/2010   Method used:   Electronically to        [facility]. [phone]* (retail)       [address]       [place], [place]  [address]       Ph: [phone]       Fax: [phone]   RxID:   [id number] METFORMIN HCL 500 MG XR24H-TAB (METFORMIN HCL) 1 by mouth two times a day  #60 x 6   Entered by:   Orlan Leavens RMA   Authorized by:   Newt Lukes MD   Signed by:   Orlan Leavens RMA on 09/02/2010   Method used:   Electronically to        [facility] [phone]* (retail)       [address]       [place], [place]  [address]       Ph: [phone]       Fax: [phone]   RxID:   [id number] ZOCOR 20 MG TABS (SIMVASTATIN) take 1 by mouth once daily  #30 x 6   Entered by:   Orlan Leavens RMA   Authorized by:   Newt Lukes MD   Signed by:   Orlan Leavens RMA on 09/02/2010   Method used:   Electronically to  [facility] [address] [phone]* (retail)       [address]       [place], [place]  [address]       Ph: [phone]       Fax: [phone]   RxID:   [id number] ZOLOFT 50 MG TABS (SERTRALINE HCL) take 1 by mouth qd  #30 x 6   Entered by:   Orlan Leavens RMA   Authorized by:   Newt Lukes MD   Signed by:   Orlan Leavens RMA on 09/02/2010   Method used:   Electronically to        [facility] [phone]* (retail)       [address]       [place], [place]  [address]       Ph: [phone]       Fax: [phone]   RxID:    [id number]  Flu Vaccine Consent Questions     Do you have a history of severe allergic reactions to this vaccine? no    Any prior history of allergic reactions to egg and/or gelatin? no    Do you have a sensitivity to the preservative Thimersol? no    Do you have a past history of Guillan-Barre Syndrome? no    Do you currently have an acute febrile illness? no    Have you ever had a severe reaction to latex? no    Vaccine information given and explained to patient? yes    Are you currently pregnant? no    Lot Number:[id number]   Exp Date:06/13/2011   Site Given  Left Deltoid IM       [place], [place]  [address]       Ph: [phone]       Fax: [phone]   RxID:   [id number]  .lbflu

## 2011-01-15 NOTE — Letter (Signed)
Summary: [facility]  [facility]   Imported By: Sherian Rein 05/27/2010 11:17:37  _____________________________________________________________________  External Attachment:    Type:   Image     Comment:   External Document

## 2011-01-15 NOTE — Procedures (Signed)
Summary: Colonoscopy/[facility]  Colonoscopy/[facility]   Imported By: Sherian Rein 05/05/2010 15:14:14  _____________________________________________________________________  External Attachment:    Type:   Image     Comment:   External Document

## 2011-03-03 ENCOUNTER — Ambulatory Visit: Payer: Self-pay | Admitting: Internal Medicine

## 2011-03-04 ENCOUNTER — Ambulatory Visit: Payer: Self-pay | Admitting: Internal Medicine

## 2011-03-16 DIAGNOSIS — R05 Cough: Secondary | ICD-10-CM | POA: Insufficient documentation

## 2011-04-01 ENCOUNTER — Encounter: Payer: Self-pay | Admitting: Internal Medicine

## 2011-04-04 ENCOUNTER — Other Ambulatory Visit: Payer: Self-pay | Admitting: Internal Medicine

## 2011-04-08 ENCOUNTER — Other Ambulatory Visit (INDEPENDENT_AMBULATORY_CARE_PROVIDER_SITE_OTHER): Payer: BC Managed Care – PPO

## 2011-04-08 ENCOUNTER — Encounter: Payer: Self-pay | Admitting: Internal Medicine

## 2011-04-08 ENCOUNTER — Ambulatory Visit (INDEPENDENT_AMBULATORY_CARE_PROVIDER_SITE_OTHER): Payer: BC Managed Care – PPO | Admitting: Internal Medicine

## 2011-04-08 DIAGNOSIS — E119 Type 2 diabetes mellitus without complications: Secondary | ICD-10-CM

## 2011-04-08 DIAGNOSIS — J019 Acute sinusitis, unspecified: Secondary | ICD-10-CM

## 2011-04-08 DIAGNOSIS — J0100 Acute maxillary sinusitis, unspecified: Secondary | ICD-10-CM

## 2011-04-08 MED ORDER — LEVOFLOXACIN 500 MG PO TABS: 500.0000 mg | ORAL_TABLET | Freq: Every day | ORAL | Status: AC

## 2011-04-08 MED ORDER — PREDNISONE (PAK) 10 MG PO TABS: 10.0000 mg | ORAL_TABLET | ORAL | Status: AC

## 2011-04-08 NOTE — Patient Instructions (Signed)
It was good to see you today. Test(s) ordered today. Your results will be called to you after review (48-72hours after test completion). If any changes need to be made, you will be notified at that time. Work note for today and next 48h until fever down and illness improved Levaquin antibiotics and pred pak for inflammation/wheeze - Your prescription(s) have been submitted to your pharmacy. Please take as directed and contact our office if you believe you are having problem(s) with the medication(s). Remember pred will make your sugars run high for few days - this is normal side effects for diabetes patients on steroidscall dr. Juanda Chance regarding repeat colonoscopy 07/2011 - let us know if you need a referral Please schedule followup in 4 months for diabetes, call sooner if problems.

## 2011-04-08 NOTE — Progress Notes (Signed)
  Subjective:    Patient ID: Faith Orr, female    DOB: 01/28/1941, 70 y.o.   MRN: [id number]  HPI  here for cough and cold Onset 1 week ago associated with chest tightness, yellow brown sputum and feeling chilled/subjective fever Also ST, HA, ear itch and face pain/upper teeth pain. 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 denies adverse side effects related to current therapy.  dyslipidemia - reports compliance with ongoing medical treatment and no changes in medication dose or frequency. denies adverse side effects related to current therapy.  no muscle or GI upset  depression - reports compliance with ongoing medical treatment and no changes in medication dose or frequency. denies adverse side effects related to current therapy. no si/hi  osteopenia -reports compliance with ongoing medical treatment and changes from actonel to atelvia. denies adverse side effects related to current therapy.   Past Medical History  Diagnosis Date  . Obesity, unspecified   . OSTEOPENIA   . URINARY INCONTINENCE   . DEPRESSION   . DIABETES MELLITUS, TYPE II   . HYPERLIPIDEMIA      Review of Systems  Respiratory: Positive for cough, chest tightness and wheezing.   Cardiovascular: Negative for chest pain, palpitations and leg swelling.  Neurological: Positive for headaches.       Objective:   Physical Exam BP 120/70  Pulse 88  Temp(Src) 99.2 F (37.3 C) (Oral)  Ht 4\' 11"  (1.499 m)  Wt 190 lb 6.4 oz (86.365 kg)  BMI 38.46 kg/m2  SpO2 95% Physical Exam  Constitutional: She is oriented to person, place, and time. She appears  well-developed and well-nourished but mildly ill. HENT: Head: Normocephalic and atraumatic. Ears: red TM left, hazy right TM, no effusion.  Nose: Nose normal. Tender to palp over maxillary region bilaterally Mouth/Throat: Oropharynx is red with PND. No oropharyngeal exudate.  Eyes: Conjunctivae and EOM are normal. Pupils are equal, round, and reactive to light. No scleral icterus.  Neck: Normal range of motion. Neck supple. No JVD present. No thyromegaly present.  Cardiovascular: Normal rate, regular rhythm and normal heart sounds.  No murmur heard. Pulmonary/Chest: Effort normal and breath sounds normal. No respiratory distress. She has mild exp wheezes.       Lab Results  Component Value Date   WBC 6.3 09/29/2008   HGB 13.5 09/29/2008   HCT 41.9 09/29/2008   PLT 257 09/29/2008   CHOL 197 05/28/2010   TRIG 165.0* 05/28/2010   HDL 57.10 05/28/2010   ALT 28 05/28/2010   AST 36 05/28/2010   NA 141 01/01/2010   K 4.4 01/01/2010   CL 107 01/01/2010   CREATININE 0.7 01/01/2010   BUN 17 01/01/2010   CO2 23 01/01/2010   TSH 0.96 05/28/2009   HGBA1C 6.1 12/02/2010   MICROALBUR 1.2 05/28/2010     Assessment & Plan:  Acute sinusitus - levaquin x 7 days (allg to pcn and sulfa), pred for bronchitis/wheeze component - erx done - reminded of steroid effect on DM; also work note provided

## 2011-04-08 NOTE — Assessment & Plan Note (Signed)
The current medical regimen is effective;  continue present plan and medications. Check a1c now  

## 2011-04-09 ENCOUNTER — Telehealth: Payer: Self-pay | Admitting: Internal Medicine

## 2011-04-09 NOTE — Telephone Encounter (Signed)
Pt Notified with lab results  

## 2011-04-09 NOTE — Telephone Encounter (Signed)
Please call patient - normal a1c results. No medication changes recommended. Thanks.    

## 2011-04-10 ENCOUNTER — Other Ambulatory Visit: Payer: Self-pay | Admitting: Internal Medicine

## 2011-04-17 ENCOUNTER — Telehealth: Payer: Self-pay

## 2011-04-17 MED ORDER — ALBUTEROL SULFATE HFA 108 (90 BASE) MCG/ACT IN AERS: 2.0000 | INHALATION_SPRAY | Freq: Four times a day (QID) | RESPIRATORY_TRACT | Status: DC | PRN

## 2011-04-17 MED ORDER — FLUTICASONE PROPIONATE 50 MCG/ACT NA SUSP: 2.0000 | Freq: Every day | NASAL | Status: DC

## 2011-04-17 NOTE — Telephone Encounter (Signed)
Done per emr 

## 2011-04-17 NOTE — Telephone Encounter (Signed)
Pt called stating she has finish ABX course but is still having laryngitis and clear post nasal drip. Pt thinks that her sxs are due to pollen and is requesting Rx for nasal spray and rescue inhaler, please advise.

## 2011-04-20 NOTE — Telephone Encounter (Signed)
Pt advised.

## 2011-05-01 ENCOUNTER — Telehealth: Payer: Self-pay | Admitting: *Deleted

## 2011-05-01 NOTE — Telephone Encounter (Signed)
Scheduled for OV tomorrow.

## 2011-05-01 NOTE — Telephone Encounter (Signed)
Chart indicates pt actually seen at OV per Dr Felicity Coyer late April with course of antibx and prednisone  Pt called and specifically herself asked for the meds below by phone, which I did per emr without seeing pt  Dr Felicity Coyer is not here.  I don't really know where to go with this without further evaluation;  Consider OV today or at [facility] in the AM

## 2011-05-01 NOTE — Telephone Encounter (Signed)
Pt sates that she was seen 3 wks ago for bronchitis and that she still has the cough. Pt says that she was prescribed Proventil HFA & Fluticasone spray by Dr Jonny Ruiz and that neither med is helping at all. She also states that she has tried Benzonatate, which have not helped the cough.  Pt c/o expiratory wheezing w/runny nose and has tried Robitussin Multi-symptom which she states helps for about an hour only.] Pt would like any suggestions for what she can do or take.?

## 2011-05-02 ENCOUNTER — Ambulatory Visit (INDEPENDENT_AMBULATORY_CARE_PROVIDER_SITE_OTHER): Payer: BC Managed Care – PPO | Admitting: Family Medicine

## 2011-05-02 VITALS — BP 120/70 | HR 80 | Temp 98.5°F | Wt 189.0 lb

## 2011-05-02 DIAGNOSIS — R062 Wheezing: Secondary | ICD-10-CM

## 2011-05-02 DIAGNOSIS — J45909 Unspecified asthma, uncomplicated: Secondary | ICD-10-CM

## 2011-05-02 DIAGNOSIS — R05 Cough: Secondary | ICD-10-CM

## 2011-05-02 MED ORDER — PREDNISONE 10 MG PO TABS: ORAL_TABLET | ORAL | Status: AC

## 2011-05-02 MED ORDER — AZITHROMYCIN 250 MG PO TABS: ORAL_TABLET | ORAL | Status: AC

## 2011-05-02 NOTE — Patient Instructions (Signed)
Avoid mentholated cough drops or any peppermint or spearmint products. Try over-the-counter antihistamine such as chlorpheniramine or brompheniramine at night for postnasal drip symptoms. Continue albuterol as needed for symptomatic with a cough and wheezing Follow up with Dr Felicity Coyer if cough no better in 2 weeks.

## 2011-05-02 NOTE — Progress Notes (Signed)
  Subjective:    Patient ID: Faith Orr, female    DOB: 1941/08/15, 70 y.o.   MRN: [id number]  HPI Patient is seen Saturday morning clinic with a three-week history of cough. Was seen a few weeks ago started on prednisone and Levaquin for sinusitis and reactive airway symptoms. No significant improvement. No history of asthma. She has been taking albuterol with minimal relief. Cough is productive of brown sputum past few days. No fever. Wheezing is worse with exertion. She denied any active GERD symptoms. He is using some mentholated cough drops off and on. No history of any hemoptysis, pleuritic pain, appetite change, or weight change.   Review of Systems  Constitutional: Positive for fatigue. Negative for fever, chills, appetite change and unexpected weight change.  HENT: Positive for postnasal drip. Negative for ear pain, sore throat and sinus pressure.   Respiratory: Positive for cough and wheezing.   Cardiovascular: Negative for chest pain.  Genitourinary: Negative for dysuria.  Neurological: Negative for headaches.       Objective:   Physical Exam  Constitutional: She is oriented to person, place, and time. She appears well-developed and well-nourished.  HENT:  Right Ear: External ear normal.  Left Ear: External ear normal.  Mouth/Throat: Oropharynx is clear and moist.  Neck: Neck supple.  Cardiovascular: Normal rate, regular rhythm and normal heart sounds.   No murmur heard. Pulmonary/Chest: No respiratory distress. She has no rales.       Patient has some very faint wheezes but no retractions. Symmetric breath sounds. No rales.  Musculoskeletal: She exhibits no edema.  Lymphadenopathy:    She has no cervical adenopathy.  Neurological: She is alert and oriented to person, place, and time.          Assessment & Plan:  Patient presents with persistent cough. Probably has mild reactive airway component. Avoid exacerbating factors such as mentholated cough drops (to avoid  GERD). Try over-the-counter antihistamine such as brompheniramine or chlorpheniramine at night to reduce postnasal drip. Zithromax for 5 days. Prednisone taper for the next week

## 2011-05-03 ENCOUNTER — Encounter: Payer: Self-pay | Admitting: Family Medicine

## 2011-05-04 ENCOUNTER — Telehealth: Payer: Self-pay

## 2011-05-04 NOTE — Telephone Encounter (Signed)
[facility] Triage Call Report Triage Record Num: [id number] Operator: Arline Asp Loftin Patient Name: Talibah Colasurdo Call Date & Time: 05/03/2011 9:51:12AM Patient Phone: [phone] PCP: Rene Paci Patient Gender: Female PCP Fax : [phone] Patient DOB: 06-23-1941 [facility] Name: [facility] Reason for Call: Seen per Dr. Caryl Never on 05/19, dx with wheezing, was to receive Zithromax/Zpack 250mg  , and Prednisone. Pharmacy never received script for Zithromax, but did receive script for Prednisone. T. O,. R&A for "Zithromax 250mg , 2 tablets today (05/20), one tablet daily for next 4 days, disp QS, NR" per Evelena Peat MD called in to [facility], [phone]. Protocol(s) Used: Medication Question Calls, No Triage (Adults) Recommended Outcome per Protocol: Call Provider within 24 Hours Reason for Outcome: Caller requesting a non urgent new prescription or refill and triager unable to refill per unit policy Care Advice: ~ 05/03/2011 10:22:59AM Page 1 of 1 [email or web address]

## 2011-05-20 ENCOUNTER — Encounter: Payer: Self-pay | Admitting: Internal Medicine

## 2011-05-20 ENCOUNTER — Ambulatory Visit (INDEPENDENT_AMBULATORY_CARE_PROVIDER_SITE_OTHER)
Admission: RE | Admit: 2011-05-20 | Discharge: 2011-05-20 | Disposition: A | Discharge status: Home / Self Care | Payer: BC Managed Care – PPO | Source: Ambulatory Visit | Attending: Internal Medicine | Admitting: Internal Medicine

## 2011-05-20 ENCOUNTER — Other Ambulatory Visit (INDEPENDENT_AMBULATORY_CARE_PROVIDER_SITE_OTHER): Payer: BC Managed Care – PPO | Admitting: Internal Medicine

## 2011-05-20 ENCOUNTER — Telehealth: Payer: Self-pay | Admitting: Internal Medicine

## 2011-05-20 ENCOUNTER — Other Ambulatory Visit: Payer: Self-pay | Admitting: Internal Medicine

## 2011-05-20 ENCOUNTER — Ambulatory Visit (INDEPENDENT_AMBULATORY_CARE_PROVIDER_SITE_OTHER): Payer: BC Managed Care – PPO | Admitting: Internal Medicine

## 2011-05-20 ENCOUNTER — Other Ambulatory Visit (INDEPENDENT_AMBULATORY_CARE_PROVIDER_SITE_OTHER): Payer: BC Managed Care – PPO

## 2011-05-20 VITALS — BP 112/78 | HR 84 | Temp 98.1°F | Ht 59.0 in | Wt 191.4 lb

## 2011-05-20 DIAGNOSIS — Z136 Encounter for screening for cardiovascular disorders: Secondary | ICD-10-CM

## 2011-05-20 DIAGNOSIS — Z0000 Encounter for general adult medical examination without abnormal findings: Secondary | ICD-10-CM

## 2011-05-20 DIAGNOSIS — R05 Cough: Secondary | ICD-10-CM

## 2011-05-20 DIAGNOSIS — R9389 Abnormal findings on diagnostic imaging of other specified body structures: Secondary | ICD-10-CM

## 2011-05-20 LAB — URINALYSIS, ROUTINE W REFLEX MICROSCOPIC
Bilirubin Urine: NEGATIVE
Nitrite: NEGATIVE
Total Protein, Urine: NEGATIVE
Urine Glucose: NEGATIVE

## 2011-05-20 LAB — LDL CHOLESTEROL, DIRECT: Direct LDL: 117.3 mg/dL

## 2011-05-20 LAB — CBC WITH DIFFERENTIAL/PLATELET
Eosinophils Relative: 8.7 % — ABNORMAL HIGH (ref 0.0–5.0)
HCT: 40.7 % (ref 36.0–46.0)
Lymphs Abs: 2 10*3/uL (ref 0.7–4.0)
MCV: 90.4 fl (ref 78.0–100.0)
Monocytes Absolute: 0.7 10*3/uL (ref 0.1–1.0)
Platelets: 241 10*3/uL (ref 150.0–400.0)
WBC: 9 10*3/uL (ref 4.5–10.5)

## 2011-05-20 LAB — BASIC METABOLIC PANEL
BUN: 16 mg/dL (ref 6–23)
CO2: 26 mEq/L (ref 19–32)
Chloride: 106 mEq/L (ref 96–112)
Glucose, Bld: 132 mg/dL — ABNORMAL HIGH (ref 70–99)
Potassium: 4.7 mEq/L (ref 3.5–5.1)

## 2011-05-20 LAB — LIPID PANEL
HDL: 52.2 mg/dL (ref >39.00)
Total CHOL/HDL Ratio: 4
VLDL: 53.4 mg/dL — ABNORMAL HIGH (ref 0.0–40.0)

## 2011-05-20 LAB — HEPATIC FUNCTION PANEL
Bilirubin, Direct: 0.1 mg/dL (ref 0.0–0.3)
Total Bilirubin: 0.5 mg/dL (ref 0.3–1.2)

## 2011-05-20 MED ORDER — OMEPRAZOLE 20 MG PO CPDR: 20.0000 mg | DELAYED_RELEASE_CAPSULE | Freq: Two times a day (BID) | ORAL | Status: DC

## 2011-05-20 NOTE — Telephone Encounter (Signed)
I called patient - normal or stable CPX lab results. No medication changes recommended. Cont PPI, no abx CXR suggests calcification in left mid lung - pt reports this "not new" but we do not have prior CXR in our system to compare Refer for CT chest to further eval and to pulm for abn CXR as well as cough symptoms - pt understands same - orders done

## 2011-05-20 NOTE — Patient Instructions (Addendum)
It was good to see you today.  Exam and EKG look good today  Start omeprazole 2x/day for silent reflux that may be aggravating your cough- Your prescription(s) have been submitted to your pharmacy. Please take as directed and contact our office if you believe you are having problem(s) with the medication(s).  Medications reviewed, no changes at this time.  Test(s) ordered today. Your results will be called to you after review (48-72hours after test completion). If any changes need to be made, you will be notified at that time.  Will make referral to pulmonary or ENT for your cough based on these results  Please schedule followup in 2-3 months for diabetes check, call sooner if problems.

## 2011-05-20 NOTE — Progress Notes (Signed)
Subjective:    Patient ID: Faith Orr, female    DOB: 1941-06-04, 70 y.o.   MRN: [id number]  HPI patient is here today for annual physical. 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 denies adverse side effects related to current therapy.  dyslipidemia - on statin for same - reports compliance with ongoing medical treatment and no changes in medication dose or frequency. denies adverse side effects related to current therapy.  no muscle or GI upset  depression - reports compliance with ongoing medical treatment and no changes in medication dose or frequency. denies adverse side effects related to current therapy. no si/hi  osteopenia -reports compliance with ongoing medical treatment and changes back to actonel from [place] due to cost. denies adverse side effects related to current therapy.   Past Medical History  Diagnosis Date  . Obesity, unspecified   . OSTEOPENIA   . URINARY INCONTINENCE   . DEPRESSION   . DIABETES MELLITUS, TYPE II   . HYPERLIPIDEMIA    Family History  Problem Relation Age of Onset  . Diabetes Mother   . Hyperlipidemia Mother   . Diabetes Father     . Hyperlipidemia Father   . Multiple sclerosis Daughter    History  Substance Use Topics  . Smoking status: Never Smoker   . Smokeless tobacco: Not on file   Comment: Lives with partner Faith Orr) and 13y son  . Alcohol Use: No     Review of Systems  Constitutional: Negative for activity change and unexpected weight change.  HENT: Negative for ear pain and tinnitus.   Eyes: Negative for visual disturbance.  Respiratory: Positive for cough, chest tightness and wheezing.   Cardiovascular: Negative for palpitations and leg swelling.  Genitourinary: Negative for dysuria.  Musculoskeletal: Negative for joint swelling.  Neurological: Negative for dizziness and headaches.  Psychiatric/Behavioral: Negative for behavioral problems and confusion.  No other specific complaints in a complete review of systems (except as listed in HPI above).      Objective:   Physical Exam BP 112/78  Pulse 84  Temp(Src) 98.1 F (36.7 C) (Oral)  Ht 4\' 11"  (1.499 m)  Wt 191 lb 6.4 oz (86.818 kg)  BMI 38.66 kg/m2  SpO2 94% Physical Exam  Constitutional: She is overweight; oriented to person, place, and time. She appears well-developed and well-nourished but mildly ill. HENT: Head: Normocephalic and atraumatic. Ears: B TM ok, no effusion or erythema; Nose normal. Non tender to palp over maxillary or frontal region bilaterally; Mouth/Throat: Oropharynx is mod red - No oropharyngeal exudate or PND.  Eyes: Conjunctivae and EOM are normal. Pupils are equal, round, and reactive to light. No scleral icterus.  Neck: Normal range of motion. Neck supple.  No JVD present. No thyromegaly present.  Cardiovascular: Normal rate, regular rhythm and normal heart sounds.  No murmur heard. No BLE edema. Pulmonary/Chest: Effort normal. No respiratory distress. She has mild exp wheezes. no crackles Abd: obese, soft, NT, +BS GU: defer to gyn Nature conservation officer) MSkel - FROM, no gross deformities Neuro: AAOx4, CN2-12 symmetric,  MAE, gait and coordination normal Skin: no erythema or rashes Psyc: normal mood and affect; good judgement and insight      Lab Results  Component Value Date   WBC 6.3 09/29/2008   HGB 13.5 09/29/2008   HCT 41.9 09/29/2008   PLT 257 09/29/2008   CHOL 197 05/28/2010   TRIG 165.0* 05/28/2010   HDL 57.10 05/28/2010   ALT 28 05/28/2010   AST 36 05/28/2010   NA 141 01/01/2010   K 4.4 01/01/2010   CL 107 01/01/2010   CREATININE 0.7 01/01/2010   BUN 17 01/01/2010   CO2 23 01/01/2010   TSH 0.96 05/28/2009   HGBA1C 6.4 04/08/2011   MICROALBUR 1.2 05/28/2010     Assessment & Plan:  CPX - v70.0 - Patient has been counseled on age-appropriate routine health concerns for screening and prevention. These are reviewed and up-to-date. Immunizations are up-to-date or declined. Labs and ECG reviewed - nsr  Cough - ongoing >41mo, dry - unresolved after abx+pred pak x 2;  Check cxr, labs as for cpx - add PPI for GERD component and refer to pulm or ENT based on cxr/labs

## 2011-05-22 ENCOUNTER — Ambulatory Visit (INDEPENDENT_AMBULATORY_CARE_PROVIDER_SITE_OTHER)
Admission: RE | Admit: 2011-05-22 | Discharge: 2011-05-22 | Disposition: A | Discharge status: Home / Self Care | Payer: BC Managed Care – PPO | Source: Ambulatory Visit | Attending: Internal Medicine | Admitting: Internal Medicine

## 2011-05-22 DIAGNOSIS — R05 Cough: Secondary | ICD-10-CM

## 2011-05-22 DIAGNOSIS — R9389 Abnormal findings on diagnostic imaging of other specified body structures: Secondary | ICD-10-CM

## 2011-05-22 DIAGNOSIS — R918 Other nonspecific abnormal finding of lung field: Secondary | ICD-10-CM

## 2011-05-22 MED ORDER — IOHEXOL 300 MG/ML  SOLN
80.0000 mL | Freq: Once | INTRAMUSCULAR | Status: AC | PRN
  Administered 2011-05-22: 80 mL via INTRAVENOUS

## 2011-05-23 ENCOUNTER — Emergency Department (HOSPITAL_COMMUNITY)
Admission: EM | Admit: 2011-05-23 | Discharge: 2011-05-23 | Disposition: A | Discharge status: Home / Self Care | Payer: BC Managed Care – PPO | Attending: Emergency Medicine | Admitting: Emergency Medicine

## 2011-05-23 ENCOUNTER — Emergency Department (HOSPITAL_COMMUNITY): Payer: BC Managed Care – PPO

## 2011-05-23 DIAGNOSIS — R062 Wheezing: Secondary | ICD-10-CM | POA: Insufficient documentation

## 2011-05-23 DIAGNOSIS — J40 Bronchitis, not specified as acute or chronic: Secondary | ICD-10-CM | POA: Insufficient documentation

## 2011-05-23 DIAGNOSIS — R0682 Tachypnea, not elsewhere classified: Secondary | ICD-10-CM | POA: Insufficient documentation

## 2011-05-23 DIAGNOSIS — I498 Other specified cardiac arrhythmias: Secondary | ICD-10-CM | POA: Insufficient documentation

## 2011-05-23 DIAGNOSIS — R0602 Shortness of breath: Secondary | ICD-10-CM | POA: Insufficient documentation

## 2011-05-23 DIAGNOSIS — E119 Type 2 diabetes mellitus without complications: Secondary | ICD-10-CM | POA: Insufficient documentation

## 2011-05-23 DIAGNOSIS — E7800 Pure hypercholesterolemia, unspecified: Secondary | ICD-10-CM | POA: Insufficient documentation

## 2011-05-23 DIAGNOSIS — E876 Hypokalemia: Secondary | ICD-10-CM | POA: Insufficient documentation

## 2011-05-23 LAB — BASIC METABOLIC PANEL
BUN: 9 mg/dL (ref 6–23)
CO2: 20 mEq/L (ref 19–32)
Chloride: 106 mEq/L (ref 96–112)
Creatinine, Ser: 0.71 mg/dL (ref 0.4–1.2)
GFR calc Af Amer: >60 mL/min (ref >60)
Potassium: 2.9 mEq/L — ABNORMAL LOW (ref 3.5–5.1)

## 2011-05-23 LAB — CK TOTAL AND CKMB (NOT AT ARMC)
Relative Index: INVALID (ref 0.0–2.5)
Relative Index: INVALID (ref 0.0–2.5)
Total CK: 57 U/L (ref 7–177)

## 2011-05-23 LAB — CBC
HCT: 40.6 % (ref 36.0–46.0)
MCH: 30.6 pg (ref 26.0–34.0)
MCV: 86.9 fL (ref 78.0–100.0)
Platelets: 233 10*3/uL (ref 150–400)
RDW: 12.6 % (ref 11.5–15.5)

## 2011-05-23 LAB — TROPONIN I
Troponin I: <0.3 ng/mL (ref <0.30)
Troponin I: <0.3 ng/mL (ref <0.30)

## 2011-05-23 LAB — DIFFERENTIAL
Basophils Absolute: 0 10*3/uL (ref 0.0–0.1)
Lymphocytes Relative: 32 % (ref 12–46)
Neutro Abs: 4.4 10*3/uL (ref 1.7–7.7)

## 2011-05-26 ENCOUNTER — Encounter: Payer: Self-pay | Admitting: Internal Medicine

## 2011-05-26 ENCOUNTER — Other Ambulatory Visit (INDEPENDENT_AMBULATORY_CARE_PROVIDER_SITE_OTHER): Payer: BC Managed Care – PPO

## 2011-05-26 ENCOUNTER — Ambulatory Visit (INDEPENDENT_AMBULATORY_CARE_PROVIDER_SITE_OTHER): Payer: BC Managed Care – PPO | Admitting: Internal Medicine

## 2011-05-26 ENCOUNTER — Telehealth: Payer: Self-pay | Admitting: Internal Medicine

## 2011-05-26 VITALS — BP 134/82 | HR 74 | Temp 97.7°F | Ht 59.0 in | Wt 190.0 lb

## 2011-05-26 DIAGNOSIS — E876 Hypokalemia: Secondary | ICD-10-CM

## 2011-05-26 DIAGNOSIS — R05 Cough: Secondary | ICD-10-CM

## 2011-05-26 LAB — POTASSIUM: Potassium: 4.2 mEq/L (ref 3.5–5.1)

## 2011-05-26 NOTE — Telephone Encounter (Signed)
Patients and will contact her later today. Will forward to [place].

## 2011-05-26 NOTE — Patient Instructions (Signed)
It was good to see you today. We have reviewed your prior records including labs and tests today Hold actonel and start Prilosec as discussed - no other medicine changes for now Test(s) ordered today. Your results will be called to you after review (48-72hours after test completion). If any changes need to be made, you will be notified at that time. Will work on arranging follow up Dr. Maple Hudson sooner if possible

## 2011-05-26 NOTE — Telephone Encounter (Signed)
Spoke with pt. She states was told by Dr Maple Hudson to call ask ask to speak to Florentina Addison in reference to getting her appt pending 07/01/11 moved up. I advised Florentina Addison is with

## 2011-05-26 NOTE — Progress Notes (Signed)
Subjective:    Patient ID: Faith Orr, female    DOB: 02/07/1941, 70 y.o.   MRN: [id number]  HPI patient is here ER follow up - seen 05/23/11 for shortness of breath and cough - records reviewed Home with Alb neb and 5 d pred pak - wheeze improved, dyspnea improved but continued cough Has not yet started PPI as rx'd 05/20/11 Planning to see pulm for same - [place] - in next few weeks CT lung 05/20/11 with calcified granuloma - likely unchanged from prior report cxr CXR 6/6 and 6/9 without other abnormality or active dz  continued dry cough Onset mid 03/2011 Not improved with 2 rounds abx and pred pak - 3rd pred tx ongoing now Prev, cough worse with Proventil MDI so stopped same exac by activity/mvmt -  Was associated with wheeze on expiration and dyspnea on exertion - improved with prn Alb neb Also associated with chest tightness, occasional yellow brown sputum when productive, but often dry Initially felt chilled/subjective fever - but not in past 6 weeks +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 Obesity, unspecified   . OSTEOPENIA   . URINARY INCONTINENCE   . DEPRESSION   . DIABETES MELLITUS, TYPE II   . HYPERLIPIDEMIA     Review of Systems  Constitutional: Negative for activity change and unexpected weight change.  HENT: Negative for ear pain, congestion and tinnitus.   Eyes: Negative for visual disturbance.  Respiratory: Positive for cough, chest tightness and wheezing.   Cardiovascular: Negative for palpitations and leg swelling.  Neurological: Negative for headaches.  No other specific complaints in a complete review of systems (except as listed in HPI above).      Objective:   Physical Exam BP 134/82  Pulse 74  Temp(Src) 97.7 F (36.5 C) (Oral)  Ht 4\' 11"  (1.499 m)  Wt 190 lb (86.183 kg)  BMI 38.38 kg/m2  SpO2 97% Physical Exam  Constitutional: She is  overweight; oriented to person, place, and time. She appears well-developed and well-nourished but fatigued. Adopted son at side. Cardiovascular: Normal rate, regular rhythm and normal heart sounds.  No murmur heard. No BLE edema. Pulmonary/Chest: Effort normal. No respiratory distress. She has no exp wheezes. no crackles Psyc: normal mood and affect; good judgement and insight      Lab Results  Component Value Date   WBC 8.6 05/23/2011   HGB 14.3 05/23/2011   HCT 40.6 05/23/2011   PLT 233 05/23/2011   CHOL 192 05/20/2011   TRIG 267.0* 05/20/2011   HDL 52.20 05/20/2011   LDLDIRECT 117.3 05/20/2011   ALT 29 05/20/2011   AST 37 05/20/2011   NA 140 05/23/2011   K 2.9* 05/23/2011   CL 106 05/23/2011   CREATININE 0.71 05/23/2011   BUN 9 05/23/2011   CO2 20 05/23/2011   TSH 0.81 05/20/2011   HGBA1C 6.4 04/08/2011   MICROALBUR 1.2 05/28/2010     Assessment & Plan:  Cough - ongoing >78mo, dry - ER eval for same 6/9 - reviewed, unremarkable CXR/EKG, labs (except K - likely low from 5 continuous Alb nebs) - unresolved after abx+pred pak x 3 (3rd pred pak ongoing); encouraged to start PPI for GERD component and follow up pulm as planned - will also hold actonel - doubt direct contribution to cough by may be exac silent reflux with ongoing uncontrolled cough  Hypokalemia - replacement ongoing - recheck  now and stop Kdur if K normalized

## 2011-05-27 NOTE — Telephone Encounter (Signed)
Spoke with patient-aware to be here 05-28-11 at 3pm for consult with CY.

## 2011-05-28 ENCOUNTER — Encounter: Payer: Self-pay | Admitting: Internal Medicine

## 2011-05-28 ENCOUNTER — Ambulatory Visit (INDEPENDENT_AMBULATORY_CARE_PROVIDER_SITE_OTHER): Payer: BC Managed Care – PPO | Admitting: Internal Medicine

## 2011-05-28 VITALS — BP 118/68 | HR 76 | Ht 59.5 in | Wt 192.4 lb

## 2011-05-28 DIAGNOSIS — J40 Bronchitis, not specified as acute or chronic: Secondary | ICD-10-CM | POA: Insufficient documentation

## 2011-05-28 MED ORDER — BENZONATATE 200 MG PO CAPS: 200.0000 mg | ORAL_CAPSULE | Freq: Three times a day (TID) | ORAL | Status: AC | PRN

## 2011-05-28 NOTE — Progress Notes (Signed)
  Subjective:    Patient ID: Faith Orr, female    DOB: 1941/11/21, 70 y.o.   MRN: [id number]  HPI 05/28/11- 86 yoF never smoker, referred courtesy of Dr Felicity Coyer because of cough and abnormal xray. She describes a cold with sinus congestion and cough in April. Her head cleared after treatment with levaquin and prednisone, but cough persisted. Productive brown sputum. Treated Z pak and prednisone, cough persisted. At f/u June 6, CXR > chronic calcified granulomas. CT chest 05/22/11- confirmed calcified granulomas left hilum, mediastinum, spleen; no adenopathy,possible liver cysts. Cough was tight and she had trouble getting in an inhaler at first. ER neb helped. Cough now is only dry and she notes persistent hoarseness. These are her main residual complaint, feeling much improved. Initially felt a tight sternal pressure with expiratory wheeze and some shortness of breath. Still a little tightness, but no pain.Says she is "vastly better than before".  On prilosec now empirically, but little hx of heart burn. PPD always negative, but long ago.  Review of Systems Constitutional:   No weight loss, night sweats,  Fevers, chills, fatigue, lassitude. HEENT:   No headaches,  Difficulty swallowing,  Tooth/dental problems,  Sore throat,                No sneezing, itching, ear ache, nasal congestion, post nasal drip,   CV:  No chest pain,  Orthopnea, PND, swelling in lower extremities, anasarca, dizziness, palpitations  GI  No heartburn, indigestion, abdominal pain, nausea, vomiting, diarrhea, change in bowel habits, loss of appetite  Resp:   No coughing up of blood.  No change in color of mucus.  No wheezing.    Skin: no rash or lesions.  GU: no dysuria, change in color of urine, no urgency or frequency.  No flank pain.  MS:  No joint pain or swelling.  No decreased range of motion.  No back pain.  Psych:  No change in mood or affect. No depression or anxiety.  No memory loss.      Objective:   Physical Exam General- Alert, Oriented, Affect-appropriate, Distress- none acute  Skin- rash-none, lesions- none, excoriation- none  Lymphadenopathy- none  Head- atraumatic  Eyes- Gross vision intact, PERRLA, conjunctivae clear secretions  Ears- Hearing, canals, Tm- normal  Nose- Clear, No- Septal dev, mucus, polyps, erosion, perforation   Throat- Mallampati II-III , mucosa clear - no erythema or drainage,   tonsils- atrophic    Minor hoarseness  Neck- flexible , trachea midline, no stridor , thyroid nl, carotid no bruit  Chest - symmetrical excursion , unlabored     Heart/CV- RRR , no murmur , no gallop  , no rub, nl s1 s2                     - JVD- none , edema- none, stasis changes- none, varices- none     Lung- clear to P&A, wheeze- none, cough- none , dullness-none, rub- none     Chest wall-  Abd- tender-no, distended-no, bowel sounds-present, HSM- no  Br/ Gen/ Rectal- Not done, not indicated  Extrem- cyanosis- none, clubbing, none, atrophy- none, strength- nl  Neuro- grossly intact to observation         Assessment & Plan:

## 2011-05-28 NOTE — Assessment & Plan Note (Addendum)
This seems to be improving rapidly now compared with last week. I don't see evidence of ongoing  bacterial infection in airways or sinuses to warant more antibiotic.  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  I have seen cough caused by granuloma eroding into an airway, but that would be unusual.

## 2011-05-28 NOTE — Patient Instructions (Signed)
Script to hold for benzonatate  for cough- try if needed  Liberal use of cough drops and otc cough syrups lilke Delsym is fine.  Sample Dulera 100-5 (combination bronchodilator and antiinflammatory)   2 puffs and rinse mouth, twice daily every day till used up   PPD skin test

## 2011-06-05 ENCOUNTER — Other Ambulatory Visit: Payer: Self-pay | Admitting: *Deleted

## 2011-06-05 MED ORDER — ALBUTEROL SULFATE (2.5 MG/3ML) 0.083% IN NEBU: 2.5000 mg | INHALATION_SOLUTION | Freq: Four times a day (QID) | RESPIRATORY_TRACT | Status: DC | PRN

## 2011-06-05 NOTE — Telephone Encounter (Signed)
Notified pt med sent to [facility] pharmacy

## 2011-06-10 ENCOUNTER — Telehealth: Payer: Self-pay | Admitting: Internal Medicine

## 2011-06-10 MED ORDER — DOXYCYCLINE HYCLATE 100 MG PO TABS: ORAL_TABLET | ORAL | Status: DC

## 2011-06-10 NOTE — Telephone Encounter (Signed)
Called, spoke with pt.  She was informed CDY suggest she add doxy 100mg  2 today then 1 daily until gone.  She verbalized understanding of this.

## 2011-06-10 NOTE — Telephone Encounter (Signed)
Per CY-suggest adding antibiotic Doxycycline 100 mg #8 take 2 today then 1 daily no refills.

## 2011-06-10 NOTE — Telephone Encounter (Signed)
I spoke with the patient and she states she is taking delsym, claritin and using the dulera 2 puffs twice daily and is still having a cough. She states now the cough is productive with brown phlegm. She states she has also been using her albuterol in her nebulizer 3 times a day as well. Pt states she has not tried the benzonatate that was prescribed because she has tried it in the past and it has not worked for her.  She states she feels the cough has improved some, but is now concerned about the color and amount of phlegm she is coughing up. Please advise. Carron Curie, CMA Allergies  Allergen Reactions  . Penicillins   . Sulfonamide Derivatives

## 2011-06-18 ENCOUNTER — Ambulatory Visit (INDEPENDENT_AMBULATORY_CARE_PROVIDER_SITE_OTHER): Payer: BC Managed Care – PPO | Admitting: Internal Medicine

## 2011-06-18 ENCOUNTER — Encounter: Payer: Self-pay | Admitting: Internal Medicine

## 2011-06-18 VITALS — BP 122/70 | HR 86 | Ht 59.5 in | Wt 190.2 lb

## 2011-06-18 DIAGNOSIS — J40 Bronchitis, not specified as acute or chronic: Secondary | ICD-10-CM

## 2011-06-18 MED ORDER — DOXYCYCLINE HYCLATE 100 MG PO TABS: ORAL_TABLET | ORAL | Status: DC

## 2011-06-18 MED ORDER — MOMETASONE FURO-FORMOTEROL FUM 200-5 MCG/ACT IN AERO: INHALATION_SPRAY | RESPIRATORY_TRACT | Status: DC

## 2011-06-18 NOTE — Assessment & Plan Note (Signed)
This is acting like an ongoing variable asthmatic bronchitis, probably aggravated by air quality. I will change her Dulera to 200-5.

## 2011-06-18 NOTE — Progress Notes (Signed)
Subjective:    Patient ID: Faith Orr, female    DOB: July 06, 1941, 70 y.o.   MRN: [id number]  HPI  Subjective:    Patient ID: Faith Orr, female    DOB: 1941-09-27, 70 y.o.   MRN: [id number]  HPI 05/28/11- 63 yoF never smoker, referred courtesy of Dr Felicity Coyer because of cough and abnormal xray. She describes a cold with sinus congestion and cough in April. Her head cleared after treatment with levaquin and prednisone, but cough persisted. Productive brown sputum. Treated Z pak and prednisone, cough persisted. At f/u June 6, CXR > chronic calcified granulomas. CT chest 05/22/11- confirmed calcified granulomas left hilum, mediastinum, spleen; no adenopathy,possible liver cysts. Cough was tight and she had trouble getting in an inhaler at first. ER neb helped. Cough now is only dry and she notes persistent hoarseness. These are her main residual complaint, feeling much improved. Initially felt a tight sternal pressure with expiratory wheeze and some shortness of breath. Still a little tightness, but no pain.Says she is "vastly better than before".  On prilosec now empirically, but little hx of heart burn. PPD always negative, but long ago.  06/18/11- 69 yoF never smoker, referred courtesy of Dr Felicity Coyer because of cough and abnormal xray.  CT chest 05/22/11- confirmed calcified granulomas left hilum, mediastinum, spleen; no adenopathy,possible liver cysts. Hoarseness.   She reports hoarseness largely gone. During last week in June, at beach,  she had another bout with cough, after being fine the day before. Was audibly wheezing and short of breath for the week before the cough started- beginning in [place] before going to the beach. We called in doxy which did clear the mucus. She now reports minimal morning cough. Nose dong well so she is not using nasal spray. She can now take deep breath to get in [place]. Chest is non longer tight, but she feels slight upper tracheal tickle.  Review of  Systems Constitutional:   No weight loss, night sweats,  Fevers, chills, fatigue, lassitude. HEENT:   No headaches,  Difficulty swallowing,  Tooth/dental problems,  Sore throat,                No sneezing, itching, ear ache, nasal congestion, post nasal drip,   CV:  No chest pain,  Orthopnea, PND, swelling in lower extremities, anasarca, dizziness, palpitations  GI  No heartburn, indigestion, abdominal pain, nausea, vomiting, diarrhea, change in bowel habits, loss of appetite  Resp:   No coughing up of blood.  No change in color of mucus.     Skin: no rash or lesions.  GU: no dysuria, change in color of urine, no urgency or frequency.  No flank pain.  MS:  No joint pain or swelling.  No decreased range of motion.  No back pain.  Psych:  No change in mood or affect. No depression or anxiety.  No memory loss.      Objective:   Physical Exam General- Alert, Oriented, Affect-appropriate, Distress- none acute  Skin- rash-none, lesions- none, excoriation- none  Lymphadenopathy- none  Head- atraumatic  Eyes- Gross vision intact, PERRLA, conjunctivae clear secretions  Ears- Hearing, canals, Tm- normal  Nose- Clear, No- Septal dev, mucus, polyps, erosion, perforation   Throat- Mallampati II-III , mucosa clear - no erythema or drainage,   tonsils- atrophic    Minor hoarseness  Neck- flexible , trachea midline, no stridor , thyroid nl, carotid no bruit  Chest - symmetrical excursion , unlabored     Heart/CV- RRR ,  no murmur , no gallop  , no rub, nl s1 s2                     - JVD- none , edema- none, stasis changes- none, varices- none     Lung- clear to P&A, wheeze- none, cough- none , dullness-none, rub- none     Chest wall-  Abd- tender-no, distended-no, bowel sounds-present, HSM- no  Br/ Gen/ Rectal- Not done, not indicated  Extrem- cyanosis- none, clubbing, none, atrophy- none, strength- nl  Neuro- grossly intact to observation         Assessment & Plan:      Review of Systems     Objective:   Physical Exam        Assessment & Plan:

## 2011-06-18 NOTE — Patient Instructions (Signed)
Change Dulera 100 to 200-5   Sample and script-    2 puffs and rinse mouth twice daily  Script for another round of doxycycline   Order- schedule PFT   Dx bronchitis

## 2011-06-19 ENCOUNTER — Telehealth: Payer: Self-pay | Admitting: Internal Medicine

## 2011-06-19 NOTE — Telephone Encounter (Signed)
rx was sent  Electronically on 06-18-11 but pharmacy did not receive it, so I gave a verbal order to pharmacists. Pt aware. Carron Curie, CMA

## 2011-06-26 ENCOUNTER — Institutional Professional Consult (permissible substitution): Payer: BC Managed Care – PPO | Admitting: Critical Care Medicine

## 2011-06-26 ENCOUNTER — Other Ambulatory Visit: Payer: Self-pay | Admitting: Internal Medicine

## 2011-06-26 DIAGNOSIS — Z1231 Encounter for screening mammogram for malignant neoplasm of breast: Secondary | ICD-10-CM

## 2011-07-01 ENCOUNTER — Institutional Professional Consult (permissible substitution): Payer: BC Managed Care – PPO | Admitting: Internal Medicine

## 2011-07-07 ENCOUNTER — Ambulatory Visit (INDEPENDENT_AMBULATORY_CARE_PROVIDER_SITE_OTHER): Payer: BC Managed Care – PPO | Admitting: Internal Medicine

## 2011-07-07 DIAGNOSIS — J40 Bronchitis, not specified as acute or chronic: Secondary | ICD-10-CM

## 2011-07-07 LAB — PULMONARY FUNCTION TEST

## 2011-07-07 NOTE — Progress Notes (Signed)
PFT done today. 

## 2011-07-22 ENCOUNTER — Ambulatory Visit
Admission: RE | Admit: 2011-07-22 | Discharge: 2011-07-22 | Disposition: A | Discharge status: Home / Self Care | Payer: BC Managed Care – PPO | Source: Ambulatory Visit | Attending: Internal Medicine | Admitting: Internal Medicine

## 2011-07-22 DIAGNOSIS — Z1231 Encounter for screening mammogram for malignant neoplasm of breast: Secondary | ICD-10-CM

## 2011-08-12 ENCOUNTER — Encounter: Payer: Self-pay | Admitting: Internal Medicine

## 2011-08-18 ENCOUNTER — Ambulatory Visit: Payer: BC Managed Care – PPO | Admitting: Internal Medicine

## 2011-08-24 ENCOUNTER — Encounter: Payer: Self-pay | Admitting: Internal Medicine

## 2011-08-24 ENCOUNTER — Ambulatory Visit (INDEPENDENT_AMBULATORY_CARE_PROVIDER_SITE_OTHER): Payer: BC Managed Care – PPO | Admitting: Internal Medicine

## 2011-08-24 VITALS — BP 140/80 | Wt 196.2 lb

## 2011-08-24 DIAGNOSIS — Z23 Encounter for immunization: Secondary | ICD-10-CM

## 2011-08-24 DIAGNOSIS — R05 Cough: Secondary | ICD-10-CM

## 2011-08-24 DIAGNOSIS — J40 Bronchitis, not specified as acute or chronic: Secondary | ICD-10-CM

## 2011-08-24 MED ORDER — MOMETASONE FURO-FORMOTEROL FUM 200-5 MCG/ACT IN AERO: INHALATION_SPRAY | RESPIRATORY_TRACT | Status: DC

## 2011-08-24 MED ORDER — ALBUTEROL SULFATE HFA 108 (90 BASE) MCG/ACT IN AERS: 2.0000 | INHALATION_SPRAY | RESPIRATORY_TRACT | Status: DC | PRN

## 2011-08-24 NOTE — Patient Instructions (Addendum)
Flu vax  Script to hold for [employer] for [employer]  rescue inhaler for occasional use if needed.

## 2011-08-24 NOTE — Assessment & Plan Note (Signed)
CT chest 05/22/2011 showed only chronic scarring. Sinusitis/bronchitis syndrome starting in April of 2012 and gradually improving.

## 2011-08-24 NOTE — Progress Notes (Signed)
Subjective:    Patient ID: Faith Orr, female    DOB: 09-17-1941, 70 y.o.   MRN: [id number]  HPI    Review of Systems     Objective:   Physical Exam        Assessment & Plan:   Subjective:    Patient ID: Faith Orr, female    DOB: September 03, 1941, 70 y.o.   MRN: [id number]  HPI  Subjective:    Patient ID: Faith Orr, female    DOB: [birth year], 70 y.o.   MRN: [id number]  HPI 05/28/11- 13 yoF never smoker, referred courtesy of Dr Felicity Coyer because of cough and abnormal xray. She describes a cold with sinus congestion and cough in April. Her head cleared after treatment with levaquin and prednisone, but cough persisted. Productive brown sputum. Treated Z pak and prednisone, cough persisted. At f/u June 6, CXR > chronic calcified granulomas. CT chest 05/22/11- confirmed calcified granulomas left hilum, mediastinum, spleen; no adenopathy,possible liver cysts. Cough was tight and she had trouble getting in an inhaler at first. ER neb helped. Cough now is only dry and she notes persistent hoarseness. These are her main residual complaint, feeling much improved. Initially felt a tight sternal pressure with expiratory wheeze and some shortness of breath. Still a little tightness, but no pain.Says she is "vastly better than before".  On prilosec now empirically, but little hx of heart burn. PPD always negative, but long ago.  06/18/11- 69 yoF never smoker, referred courtesy of Dr Felicity Coyer because of cough and abnormal xray.  CT chest 05/22/11- confirmed calcified granulomas left hilum, mediastinum, spleen; no adenopathy,possible liver cysts. Hoarseness.   She reports hoarseness largely gone. During last week in June, at beach,  she had another bout with cough, after being fine the day before. Was audibly wheezing and short of breath for the week before the cough started- beginning in [place] before going to the beach. We called in doxy which did clear the mucus. She now reports minimal morning  cough. Nose doing well so she is not using nasal spray. She can now take deep breath to get in [place]. Chest is non longer tight, but she feels slight upper tracheal tickle.  08/24/11- 70 year old female never smoker followed for cough, bronchitis, calcified granulomas on chest x-ray, complicated by diabetes. Taking prn claritin for congestion and inhalers also as needed. Still easily gets tight in chest- with yard work in [employer]- minimal compared to her worst. May note a little wheeze with rushing around. Cigarette smoke burns in her chest. She has used [facility] some as a rescue inhaler and we discussed meds. Educated rescue vs maintenance inhalers. PFT- reactive small airways FEF25-75% improved from 68 to 125%.  Had tetanus shot 2 years ago with Dr Felicity Coyer- she asks about pertussis vaccine and will call Dr Felicity Coyer to ask if what she had was TDAP then. Wants flu shot  Review of Systems Constitutional:   No weight loss, night sweats,  Fevers, chills, fatigue, lassitude. HEENT:   No headaches,  Difficulty swallowing,  Tooth/dental problems,  Sore throat,                No sneezing, itching, ear ache, nasal congestion, post nasal drip,  CV:  No chest pain,  Orthopnea, PND, swelling in lower extremities, anasarca, dizziness, palpitations GI  No heartburn, indigestion, abdominal pain, nausea, vomiting, diarrhea, change in bowel habits, loss of appetite Resp:   No coughing up of blood.  No change in color of  mucus.    Skin: no rash or lesions. GU: no dysuria, change in color of urine, no urgency or frequency.  No flank pain. MS:  No joint pain or swelling.  No decreased range of motion.  No back pain. Psych:  No change in mood or affect. No depression or anxiety.  No memory loss.      Objective:   Physical Exam General- Alert, Oriented, Affect-appropriate, Distress- none acute; obese Skin- rash-none, lesions- none, excoriation- none Lymphadenopathy- none Head- atraumatic            Eyes-  Gross vision intact, PERRLA, conjunctivae clear secretions            Ears- Hearing, canals normal            Nose- Clear, no-Septal dev, mucus, polyps, erosion, perforation             Throat- Mallampati II-III , mucosa clear , drainage- none, tonsils- atrophic Neck- flexible , trachea midline, no stridor , thyroid nl, carotid no bruit Chest - symmetrical excursion , unlabored           Heart/CV- RRR , no murmur , no gallop  , no rub, nl s1 s2                           - JVD- none , edema- none, stasis changes- none, varices- none           Lung- clear to P&A, wheeze- none, cough- none , dullness-none, rub- none           Chest wall-  Abd- tender-no, distended-no, bowel sounds-present, HSM- no Br/ Gen/ Rectal- Not done, not indicated Extrem- cyanosis- none, clubbing, none, atrophy- none, strength- nl Neuro- grossly intact to observation           Assessment & Plan:     Review of Systems     Objective:   Physical Exam        Assessment & Plan:

## 2011-08-24 NOTE — Assessment & Plan Note (Signed)
She has old scarring on her chest x-ray which is probably inactive and irrelevant. Cough began after a cold with sinus congestion in April 2012 and is thought related to a sinusitis/bronchitis syndrome which is now markedly improved.

## 2011-09-03 ENCOUNTER — Telehealth: Payer: Self-pay | Admitting: Internal Medicine

## 2011-09-03 MED ORDER — AZITHROMYCIN 250 MG PO TABS: ORAL_TABLET | ORAL | Status: AC

## 2011-09-03 NOTE — Telephone Encounter (Signed)
Per CY-okay to give Zpak #1 take as directed no refills and use Mucinex DM OTC per box instructions.

## 2011-09-03 NOTE — Telephone Encounter (Signed)
Pt was just recently seen by CY on 08/24/11.  States 2 family members in her house have been recently dx with bronchitis and wonders if she has gotten it.  States symptoms started yesterday.  She c/o wheezing, increased sob, feeling of "something sitting on her chest", "wet cough" will cough up clear to brown colored sputum, headache across forehead, clammy and temp of 99.2, and clear nasal drainage.  Denies sore throat.  States she has tried the albuterol HFA but states this only helps relieve sx for approx 2 hours.  Has also tried neb tx which has helped some.  Pt is requesting CY's recs.  Please advise.  Thanks. Allergies  Allergen Reactions  . Penicillins   . Sulfonamide Derivatives

## 2011-09-03 NOTE — Telephone Encounter (Signed)
Pt aware of cdy recs and aware rx was sent to pharmacy

## 2011-09-14 ENCOUNTER — Ambulatory Visit (INDEPENDENT_AMBULATORY_CARE_PROVIDER_SITE_OTHER): Payer: BC Managed Care – PPO | Admitting: Internal Medicine

## 2011-09-14 ENCOUNTER — Other Ambulatory Visit (INDEPENDENT_AMBULATORY_CARE_PROVIDER_SITE_OTHER): Payer: BC Managed Care – PPO

## 2011-09-14 ENCOUNTER — Encounter: Payer: Self-pay | Admitting: Internal Medicine

## 2011-09-14 DIAGNOSIS — E785 Hyperlipidemia, unspecified: Secondary | ICD-10-CM

## 2011-09-14 DIAGNOSIS — R1310 Dysphagia, unspecified: Secondary | ICD-10-CM

## 2011-09-14 DIAGNOSIS — E119 Type 2 diabetes mellitus without complications: Secondary | ICD-10-CM

## 2011-09-14 NOTE — Patient Instructions (Signed)
It was good to see you today. Test(s) ordered today. Your results will be called to you after review (48-72hours after test completion). If any changes need to be made, you will be notified at that time.  we'll make referral to GI for your swallowing pain. Our office will contact you regarding appointment(s) once made.

## 2011-09-14 NOTE — Progress Notes (Signed)
Subjective:    Patient ID: Faith Orr, female    DOB: 1941-08-01, 70 y.o.   MRN: [id number]  HPI here for follow up - reviewed chronic medical issues:  Cough - ongoing since spring 2012 since URI -  Seen by pulm - dx with RAD after PFTs summer 2012 - persisting dry cough symptoms despite dulera and albuterol rescue  DM 2 - has been checking sugars q AM - range 80s-120s fasting watches diet but always heavy - "life member weight watchers" had been to nutritionist and eating more veggies reports compliance with ongoing medical treatment and no changes in medication dose or frequency. denies adverse side effects related to current therapy.  dyslipidemia - on statin for same - reports compliance with ongoing medical treatment and no changes in medication dose or frequency. denies adverse side effects related to current therapy.  no muscle or GI upset  depression - reports compliance with ongoing medical treatment and no changes in medication dose or frequency. denies adverse side effects related to current therapy. no si/hi  osteopenia -reports compliance with ongoing medical treatment and changes back to actonel from [place] due to cost. denies adverse side effects related to current therapy.   complains of pain and "stuck" sensation after swallowing - 2 episodes in past few months - caused cough and regurgitation of undigested food  Past Medical History  Diagnosis Date  . Obesity, unspecified   . OSTEOPENIA   . URINARY INCONTINENCE   . DEPRESSION   . DIABETES MELLITUS, TYPE II   . HYPERLIPIDEMIA     Review of Systems  Constitutional: Negative for activity change and unexpected weight change.  Eyes: Negative for visual disturbance.  Respiratory: Negative for shortness of breath and wheezing.   Cardiovascular: Negative for chest pain.       Objective:   Physical Exam  BP 112/70  Pulse 92  Temp(Src) 98.6 F (37 C) (Oral)  Ht 5\' 2"  (1.575 m)  Wt 191 lb 6.4 oz (86.818  kg)  BMI 35.01 kg/m2  SpO2 96% Wt Readings from Last 3 Encounters:  09/14/11 191 lb 6.4 oz (86.818 kg)  08/24/11 196 lb 3.2 oz (88.996 kg)  06/18/11 190 lb 3.2 oz (86.274 kg)   Constitutional: She is overweight; She appears well-developed and well-nourished - NAD Eyes: Conjunctivae and EOM are normal. Pupils are equal, round, and reactive to light. No scleral icterus.  Neck: Normal range of motion. Neck supple. No JVD present. No thyromegaly present.  Cardiovascular: Normal rate, regular rhythm and normal heart sounds.  No murmur heard. No BLE edema. Pulmonary/Chest: Effort normal. No respiratory distress. She has no wheezes. no crackles Skin: no erythema or rashes Psyc: normal mood and affect; good judgement and insight      Lab Results  Component Value Date   WBC 8.6 05/23/2011   HGB 14.3 05/23/2011   HCT 40.6 05/23/2011   PLT 233 05/23/2011   CHOL 192 05/20/2011   TRIG 267.0* 05/20/2011   HDL 52.20 05/20/2011   LDLDIRECT 117.3 05/20/2011   ALT 29 05/20/2011   AST 37 05/20/2011   NA 140 05/23/2011   K 4.2 05/26/2011   CL 106 05/23/2011   CREATININE 0.71 05/23/2011   BUN 9 05/23/2011   CO2 20 05/23/2011   TSH 0.81 05/20/2011   HGBA1C 6.4 04/08/2011   MICROALBUR 1.2 05/28/2010     Assessment & Plan:  See problem list. Medications and labs reviewed today.  Dysphagia/odynophagia with episodic regurgitation- ?esoph stricture - ?GERD  exac cough and pulm symptoms - refer to GI to consider EGD, cont PPI

## 2011-09-14 NOTE — Assessment & Plan Note (Signed)
Except slight increase due to frequent steroids last 6 mo for asthma The current medical regimen is effective;  continue present plan and medications. Check a1c now

## 2011-09-14 NOTE — Assessment & Plan Note (Signed)
On statin - reviewed last lipids from cpx - The current medical regimen is effective;  continue present plan and medications.

## 2011-09-15 ENCOUNTER — Encounter: Payer: Self-pay | Admitting: Internal Medicine

## 2011-09-27 ENCOUNTER — Other Ambulatory Visit: Payer: Self-pay | Admitting: Internal Medicine

## 2011-09-29 ENCOUNTER — Other Ambulatory Visit: Payer: Self-pay | Admitting: Obstetrics & Gynecology

## 2011-09-29 DIAGNOSIS — M949 Disorder of cartilage, unspecified: Secondary | ICD-10-CM

## 2011-09-29 DIAGNOSIS — M899 Disorder of bone, unspecified: Secondary | ICD-10-CM

## 2011-09-30 ENCOUNTER — Telehealth: Payer: Self-pay | Admitting: *Deleted

## 2011-09-30 NOTE — Telephone Encounter (Signed)
Pt walked in to lobby today c/o up all night coughing and wanting to be seen today. Pt had COLON by Dr Juanda Chance in 2005 and was sent a RECALL letter on 08/12/11.  Pt has been seen for this problem since April, 2012. She saw Dr Maple Hudson for pulmonary problems, then an ENT. Dr Felicity Coyer finally made a referral to [name] with an appt on 10/27/11 with Dr Juanda Chance. Pt reports she wakes up between 2-3 am coughing and at times to the point she vomits. She has a feeling of something stuck in her throat; she was placed on Prilosec bid which hasn't helped. Offered pt an appt with Mike Gip, PA for Friday or Dr Juanda Chance on 10/12/11. Pt will see Amy on 10/02/11. Per Dr Christella Hartigan, [facility], pt may double on her Prilosec in the meantime; pt stated understanding.

## 2011-10-02 ENCOUNTER — Ambulatory Visit (INDEPENDENT_AMBULATORY_CARE_PROVIDER_SITE_OTHER): Payer: BC Managed Care – PPO | Admitting: Physician Assistant

## 2011-10-02 ENCOUNTER — Encounter: Payer: Self-pay | Admitting: Physician Assistant

## 2011-10-02 VITALS — BP 118/78 | HR 80 | Ht 59.75 in | Wt 188.0 lb

## 2011-10-02 DIAGNOSIS — K5790 Diverticulosis of intestine, part unspecified, without perforation or abscess without bleeding: Secondary | ICD-10-CM | POA: Insufficient documentation

## 2011-10-02 DIAGNOSIS — K219 Gastro-esophageal reflux disease without esophagitis: Secondary | ICD-10-CM

## 2011-10-02 DIAGNOSIS — K5730 Diverticulosis of large intestine without perforation or abscess without bleeding: Secondary | ICD-10-CM

## 2011-10-02 DIAGNOSIS — R05 Cough: Secondary | ICD-10-CM

## 2011-10-02 MED ORDER — HYDROCOD POLST-CHLORPHEN POLST 10-8 MG/5ML PO LQCR: ORAL | Status: DC

## 2011-10-02 NOTE — Progress Notes (Signed)
I AGREE WITH ASSESSMENT AND PLANS 

## 2011-10-02 NOTE — Patient Instructions (Signed)
We called the prescription in to the pharmacy for Tussinex liquid, Take as directed. We scheduled the Barium Swallow and the Gastric Emptying Scan. Directions for both test provided.  Stay on Prilosec twice daily.  Follow up with Dr Juanda Chance in 2-3 weeks.

## 2011-10-02 NOTE — Progress Notes (Signed)
Subjective:    Patient ID: Faith Orr, female    DOB: 10-08-1941, 70 y.o.   MRN: [id number]  HPI Faith Orr is a pleasant 73 her white female known to Dr. Lina Sar from prior colonoscopy which was done in 2005. She did not have any polyps was found to have left-sided diverticulosis. She comes in today with complaint of persistent problems with cough since April of 2012. She says she was fine prior to that time was diagnosed with a severe bronchitis and has had problems with persistent coughing and ever since. She has been seen and evaluated by Dr. Maple Hudson told her that she may have an asthmatic component and has placed her on inhalers. She has also had EENT evaluation by Dr. Jearld Fenton which was negative including CT of her head. She says over the past couple of weeks her cough has actually been worse particularly at night and she has been sitting up in a chair at night and she is worse with lying flat. He sent her cough is wet and a productive of white phlegm he foamy phlegm. She does not have any regular heartburn or indigestion symptoms but does have reflux particularly with bending over. She has been eating very carefully but does feel that her symptoms are worse after meals with belching fullness and some early satiety symptoms. She has no dysphasia or odynophagia other than very occasional problems with pills sticking. Her weight is down 4-5 pounds. She had been taking Prilosec 40 mg once daily, this was increased to twice daily within the past week she is thus far not noticed any change in her symptoms.  She states that she is frustrated and exhausted and she's not sleeping at all l well due to coughing at night.    Review of Systems  Constitutional: Positive for fatigue.  HENT: Positive for voice change.   Eyes: Negative.   Respiratory: Positive for cough and wheezing.   Cardiovascular: Negative.   Gastrointestinal: Negative.   Genitourinary: Negative.   Musculoskeletal: Negative.   Skin:  Negative.   Neurological: Negative.   Hematological: Negative.   Psychiatric/Behavioral: Negative.    Outpatient Prescriptions Prior to Visit  Medication Sig Dispense Refill  . glucose blood (FREESTYLE TEST STRIPS) test strip 1 each by Other route 2 (two) times daily. Use as instructed       . Lancets (FREESTYLE) lancets 1 each by Other route 2 (two) times daily. Use as instructed       . metFORMIN (GLUCOPHAGE-XR) 500 MG 24 hr tablet TAKE 1 TABLET BY MOUTH TWICE DAILY  60 tablet  5  . sertraline (ZOLOFT) 50 MG tablet TAKE 1 TABLET BY MOUTH EVERY DAY  30 tablet  5  . simvastatin (ZOCOR) 20 MG tablet TAKE ONE TABLET BY MOUTH DAILY  30 tablet  5  . Calcium Carbonate-Vitamin D (CALCIUM 600+D HIGH POTENCY) 600-400 MG-UNIT per tablet Take 1 tablet by mouth 3 (three) times daily.        . Cholecalciferol (VITAMIN D) 2000 UNIT CAPS Take by mouth daily.        . fluticasone (FLONASE) 50 MCG/ACT nasal spray 2 sprays by Nasal route daily.  16 g  2  . ibuprofen (ADVIL,MOTRIN) 200 MG tablet Take 200 mg by mouth as needed.        . loratadine-pseudoephedrine (CLARITIN-D 12-HOUR) 5-120 MG per tablet Take 1 tablet by mouth daily as needed.        . Mometasone Furo-Formoterol Fum (DULERA) 200-5 MCG/ACT AERO  2 puffs and rinse mouth, twice daily  1 Inhaler  prn  . Multiple Vitamin (MULTIVITAMIN) tablet Take 1 tablet by mouth daily.        . Multiple Vitamins-Minerals (ICAPS) CAPS Take 1 capsule by mouth daily.        . Nebulizers (VIOS AEROSOL DELIVERY SYSTEM) MISC Use as needed      . risedronate (ACTONEL) 5 MG tablet Take 5 mg by mouth daily before breakfast. with water on empty stomach, nothing by mouth or lie down for next 30 minutes.       Marland Kitchen albuterol (PROVENTIL HFA;VENTOLIN HFA) 108 (90 BASE) MCG/ACT inhaler Inhale 2 puffs into the lungs every 4 (four) hours as needed for wheezing or shortness of breath (rescue inhaler).  1 Inhaler  prn  . albuterol (PROVENTIL) (2.5 MG/3ML) 0.083% nebulizer solution Take 3  mLs (2.5 mg total) by nebulization 4 (four) times daily as needed.  75 mL  1   Allergies  Allergen Reactions  . Penicillins   . Sulfonamide Derivatives    Patient Active Problem List  Diagnoses  . DIABETES MELLITUS, TYPE II  . HYPERLIPIDEMIA  . OBESITY, UNSPECIFIED  . DEPRESSION  . OSTEOPENIA  . URINARY INCONTINENCE  . Cough  . Bronchitis  . Diverticulosis       Objective:   Physical Exam Well-developed white female in no acute distress, coughing frequently blood pressure 118/78 pulse 80 HEENT not remarkable spell at [facility] PERRLA sclera anicteric Neck;Supple no JVD voice is somewhat hoarse, Cardiovascular; regular rate and rhythm with S1-S2 no murmur gallop Pulmonary, clea,r she has a very faint occasional expiratory wheeze, Abdomen; soft, nontender nondistended bowel sounds active no palpable mass or hepatosplenomegal,y Rectal; not done Extremities no clubbing cyanosis or edema skin warm and dry, Psych; mood and affect normal an appropriate        Assessment & Plan:  #1 70 yo female with significant persistent cough time 7 months initial presentation as a bronchitis. Patient does have some reflux symptoms, it is possible that her cough is all reflux-induced though it sounds as if she may also have an asthmatic component. Will also rule out motility disorder and gastroparesis.  Plan; Continue high dose Prilosec 40 mg by mouth twice daily Have reviewed antireflux regimen including n.p.o. for 2-3 hours before bed and sleeping head of bed 45-60 elevated or in a recliner as she is doing. Schedule for barium swallow Schedule for gastric emptying scan Add Tussionex  5 cc at bedtime Followup with Dr. Lina Sar in about 3 weeks, depending on response and results , she may need EGD.

## 2011-10-06 ENCOUNTER — Ambulatory Visit (HOSPITAL_COMMUNITY)
Admission: RE | Admit: 2011-10-06 | Discharge: 2011-10-06 | Disposition: A | Discharge status: Home / Self Care | Payer: BC Managed Care – PPO | Source: Ambulatory Visit | Attending: Physician Assistant | Admitting: Physician Assistant

## 2011-10-06 DIAGNOSIS — R059 Cough, unspecified: Secondary | ICD-10-CM | POA: Insufficient documentation

## 2011-10-06 DIAGNOSIS — K219 Gastro-esophageal reflux disease without esophagitis: Secondary | ICD-10-CM | POA: Insufficient documentation

## 2011-10-06 DIAGNOSIS — R05 Cough: Secondary | ICD-10-CM | POA: Insufficient documentation

## 2011-10-08 ENCOUNTER — Telehealth: Payer: Self-pay | Admitting: *Deleted

## 2011-10-08 NOTE — Telephone Encounter (Signed)
Message copied by Daphine Deutscher on Thu Oct 08, 2011  4:40 PM ------      Message from: [place], [place] S      Created: Thu Oct 08, 2011  3:52 PM       Please let pt know the Ba swallow is normal, normal motility, no hiatal hernia

## 2011-10-08 NOTE — Telephone Encounter (Signed)
Spoke with patient and let her know the barium swallow was normal. She wants to let Mike Gip, PA know the cough is better. She wants to know if she still needs to do the gastric emptying scan on 10/26/11. Please, advise.

## 2011-10-09 NOTE — Telephone Encounter (Signed)
Yes I think she should ,because if emptying delayed we can help that.

## 2011-10-09 NOTE — Telephone Encounter (Signed)
Left patient a message that she should have the GES per Mike Gip, PA

## 2011-10-25 ENCOUNTER — Other Ambulatory Visit: Payer: Self-pay | Admitting: Internal Medicine

## 2011-10-26 ENCOUNTER — Telehealth: Payer: Self-pay | Admitting: *Deleted

## 2011-10-26 ENCOUNTER — Encounter (HOSPITAL_COMMUNITY)
Admission: RE | Admit: 2011-10-26 | Discharge: 2011-10-26 | Disposition: A | Discharge status: Home / Self Care | Payer: BC Managed Care – PPO | Source: Ambulatory Visit | Attending: Physician Assistant | Admitting: Physician Assistant

## 2011-10-26 DIAGNOSIS — E119 Type 2 diabetes mellitus without complications: Secondary | ICD-10-CM | POA: Insufficient documentation

## 2011-10-26 DIAGNOSIS — R05 Cough: Secondary | ICD-10-CM

## 2011-10-26 DIAGNOSIS — R059 Cough, unspecified: Secondary | ICD-10-CM | POA: Insufficient documentation

## 2011-10-26 DIAGNOSIS — K219 Gastro-esophageal reflux disease without esophagitis: Secondary | ICD-10-CM | POA: Insufficient documentation

## 2011-10-26 DIAGNOSIS — R198 Other specified symptoms and signs involving the digestive system and abdomen: Secondary | ICD-10-CM | POA: Insufficient documentation

## 2011-10-26 MED ORDER — TECHNETIUM TC 99M SULFUR COLLOID
2.1000 | Freq: Once | INTRAVENOUS | Status: AC | PRN
  Administered 2011-10-26: 2.1 via ORAL

## 2011-10-26 NOTE — Telephone Encounter (Signed)
Sertraline request [last refill 04.27.12 #30x5 last OV 10.01.12]

## 2011-10-26 NOTE — Telephone Encounter (Signed)
Message copied by Daphine Deutscher on Mon Oct 26, 2011  5:02 PM ------      Message from: [place], [place] S      Created: Mon Oct 26, 2011  4:49 PM       Please let pt know her emptying scan is normal, thanks

## 2011-10-26 NOTE — Telephone Encounter (Signed)
Left a message for patient to call me. 

## 2011-10-26 NOTE — Telephone Encounter (Signed)
Done by MD.

## 2011-10-27 ENCOUNTER — Ambulatory Visit: Payer: BC Managed Care – PPO | Admitting: Internal Medicine

## 2011-10-28 NOTE — Telephone Encounter (Signed)
Patient called and left a message that she has gotten the results of the emptying scan.

## 2011-10-28 NOTE — Telephone Encounter (Signed)
Left a message for patient to call me. 

## 2011-10-30 ENCOUNTER — Ambulatory Visit (INDEPENDENT_AMBULATORY_CARE_PROVIDER_SITE_OTHER): Payer: BC Managed Care – PPO | Admitting: Internal Medicine

## 2011-10-30 ENCOUNTER — Other Ambulatory Visit (HOSPITAL_COMMUNITY): Payer: BC Managed Care – PPO

## 2011-10-30 ENCOUNTER — Encounter: Payer: Self-pay | Admitting: Internal Medicine

## 2011-10-30 VITALS — BP 128/70 | HR 80 | Ht 59.75 in | Wt 187.0 lb

## 2011-10-30 DIAGNOSIS — K219 Gastro-esophageal reflux disease without esophagitis: Secondary | ICD-10-CM

## 2011-10-30 MED ORDER — OMEPRAZOLE 40 MG PO CPDR: 40.0000 mg | DELAYED_RELEASE_CAPSULE | Freq: Two times a day (BID) | ORAL | Status: DC

## 2011-10-30 NOTE — Patient Instructions (Signed)
We have sent the following medications to your pharmacy for you to pick up at your convenience: Prilosec 40 mg twice daily. CC: Dr Felicity Coyer

## 2011-10-30 NOTE — Progress Notes (Signed)
Faith Orr 02-16-1941 MRN [id number]        History of Present Illness:  This is a 70 year old white female with severe gastroesophageal reflux and cough, she  returns after treatment for gastroesophageal reflux. She has been on Prilosec 40 mg twice a day and Tussionex 1 teaspoon when necessary cough. She did markedly better after several days of taking Tussionex. It appears that when she stoped coughing the reflux has diminished and her symptoms subsided. Her barium esophagram showed normal motility. 13 mm tablet passed without delay. There was no stricture or  hiatal hernia or reflux. Her gastric emptying scan showed 73% retention at 60 minutes and 23% retention in 120 minutes of age correlates with normal gastric emptying    Past Medical History  Diagnosis Date  . Obesity, unspecified   . OSTEOPENIA   . URINARY INCONTINENCE   . DEPRESSION   . DIABETES MELLITUS, TYPE II   . HYPERLIPIDEMIA   . Diverticulosis   . Asthma    Past Surgical History  Procedure Date  . Cesarean section   . Right ankle   . Left wrist surgery in 2008    By Dr. Yisroel Ramming    reports that she has never smoked. She has never used smokeless tobacco. She reports that she does not drink alcohol or use illicit drugs. family history includes Cancer in an unspecified family member; Diabetes in her father and mother; Hyperlipidemia in her father and mother; and Multiple sclerosis in her daughter. Allergies  Allergen Reactions  . Penicillins   . Sulfonamide Derivatives         Review of Systems: Denies dysphagia currently denies any heartburn abdominal pain chest pain or shortness of breath  The remainder of the 10 point ROS is negative except as outlined in H&P     Assessment and Plan:  Gastroesophageal reflux disease currently  asymptomatic on Prilosec 40 mg by mouth twice a day. Barium esophagram and gastric emptying scan all normal. Her cough has resolved and because of that, her reflux has  diminished considerably. In fact there was no reflux of barium esophagram. She will decrease her Prilosec to 20 mg twice a day and eventually 20 mg once a day. Her last colonoscopy was in 2005 and she has a positive family history of colorectal cancer in maternal grandfather . She will schedule colonoscopy in near future   10/30/2011 Lina Sar

## 2011-11-04 ENCOUNTER — Ambulatory Visit
Admission: RE | Admit: 2011-11-04 | Discharge: 2011-11-04 | Disposition: A | Discharge status: Home / Self Care | Payer: BC Managed Care – PPO | Source: Ambulatory Visit | Attending: Obstetrics & Gynecology | Admitting: Obstetrics & Gynecology

## 2011-11-04 ENCOUNTER — Encounter: Payer: Self-pay | Admitting: Internal Medicine

## 2011-11-04 DIAGNOSIS — M899 Disorder of bone, unspecified: Secondary | ICD-10-CM

## 2011-11-11 ENCOUNTER — Other Ambulatory Visit: Payer: Self-pay | Admitting: Internal Medicine

## 2011-11-11 ENCOUNTER — Telehealth: Payer: Self-pay | Admitting: Internal Medicine

## 2011-11-11 MED ORDER — OMEPRAZOLE 20 MG PO CPDR: 20.0000 mg | DELAYED_RELEASE_CAPSULE | Freq: Every day | ORAL | Status: DC

## 2011-11-11 NOTE — Telephone Encounter (Signed)
Spoke to patient, she is supposed to be going down to 20 mg once daily. We sent rx for 40 mg. I have sent new rx and placed samples at the front desk for her.

## 2011-12-11 ENCOUNTER — Other Ambulatory Visit: Payer: Self-pay | Admitting: Obstetrics & Gynecology

## 2011-12-11 DIAGNOSIS — N63 Unspecified lump in unspecified breast: Secondary | ICD-10-CM

## 2011-12-14 ENCOUNTER — Other Ambulatory Visit: Payer: BC Managed Care – PPO | Admitting: Internal Medicine

## 2011-12-16 ENCOUNTER — Ambulatory Visit
Admission: RE | Admit: 2011-12-16 | Discharge: 2011-12-16 | Disposition: A | Discharge status: Home / Self Care | Payer: BC Managed Care – PPO | Source: Ambulatory Visit | Attending: Obstetrics & Gynecology | Admitting: Obstetrics & Gynecology

## 2011-12-16 ENCOUNTER — Encounter: Payer: Self-pay | Admitting: Internal Medicine

## 2011-12-16 ENCOUNTER — Ambulatory Visit (INDEPENDENT_AMBULATORY_CARE_PROVIDER_SITE_OTHER): Payer: BC Managed Care – PPO | Admitting: Internal Medicine

## 2011-12-16 ENCOUNTER — Other Ambulatory Visit (INDEPENDENT_AMBULATORY_CARE_PROVIDER_SITE_OTHER): Payer: BC Managed Care – PPO

## 2011-12-16 VITALS — BP 110/78 | HR 78 | Temp 97.5°F | Wt 188.8 lb

## 2011-12-16 DIAGNOSIS — N63 Unspecified lump in unspecified breast: Secondary | ICD-10-CM

## 2011-12-16 DIAGNOSIS — J45909 Unspecified asthma, uncomplicated: Secondary | ICD-10-CM | POA: Insufficient documentation

## 2011-12-16 DIAGNOSIS — E119 Type 2 diabetes mellitus without complications: Secondary | ICD-10-CM

## 2011-12-16 MED ORDER — MOMETASONE FURO-FORMOTEROL FUM 200-5 MCG/ACT IN AERO: INHALATION_SPRAY | RESPIRATORY_TRACT | Status: DC

## 2011-12-16 NOTE — Progress Notes (Signed)
  Subjective:    Patient ID: Faith Orr, female    DOB: [birth year], 71 y.o.   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 denies adverse side effects related to current therapy.  dyslipidemia - on statin for same - reports compliance with ongoing medical treatment and no changes in medication dose or frequency. denies adverse side effects related to current therapy.  no muscle or GI upset  depression - reports compliance with ongoing medical treatment and no changes in medication dose or frequency. denies adverse side effects related to current therapy. no si/hi  osteopenia -last dexa 10/2011 -reports compliance with ongoing medical treatment and changes back to actonel from [place] due to cost. denies adverse side effects related to current therapy.    Past Medical History  Diagnosis Date  . Obesity, unspecified   . OSTEOPENIA   . URINARY INCONTINENCE   . DEPRESSION   . DIABETES MELLITUS, TYPE II   . HYPERLIPIDEMIA   . Diverticulosis   . Asthma   . GERD (gastroesophageal reflux disease) 10/2011    severe    Review of Systems  Constitutional: Negative for activity change and unexpected weight change.  Eyes: Negative for visual disturbance.  Respiratory: Negative for shortness of breath and wheezing.   Cardiovascular: Negative for chest pain.       Objective:   Physical Exam  BP 110/78  Pulse 78  Temp(Src)  97.5 F (36.4 C) (Oral)  Wt 188 lb 12.8 oz (85.639 kg)  SpO2 94% Wt Readings from Last 3 Encounters:  12/16/11 188 lb 12.8 oz (85.639 kg)  10/30/11 187 lb (84.823 kg)  10/02/11 188 lb (85.276 kg)   Constitutional: She is overweight; She appears well-developed and well-nourished - NAD Neck: Normal range of motion. Neck supple. No JVD present. No thyromegaly present.  Cardiovascular: Normal rate, regular rhythm and normal heart sounds.  No murmur heard. No BLE edema. Pulmonary/Chest: Effort normal. No respiratory distress. She has no wheezes. no crackles Skin: no erythema or rashes Psyc: normal mood and affect; good judgement and insight      Lab Results  Component Value Date   WBC 8.6 05/23/2011   HGB 14.3 05/23/2011   HCT 40.6 05/23/2011   PLT 233 05/23/2011   CHOL 192 05/20/2011   TRIG 267.0* 05/20/2011   HDL 52.20 05/20/2011   LDLDIRECT 117.3 05/20/2011   ALT 29 05/20/2011   AST 37 05/20/2011   NA 140 05/23/2011   K 4.2 05/26/2011   CL 106 05/23/2011   CREATININE 0.71 05/23/2011   BUN 9 05/23/2011   CO2 20 05/23/2011   TSH 0.81 05/20/2011   HGBA1C 6.4 09/14/2011   MICROALBUR 1.2 05/28/2010     Assessment & Plan:  See problem list. Medications and labs reviewed today.

## 2011-12-16 NOTE — Patient Instructions (Addendum)
It was good to see you today. Test(s) ordered today. Your results will be called to you after review (48-72hours after test completion). If any changes need to be made, you will be notified at that time.  Medications reviewed, no changes at this time. Refill on Dulera to use daily Please schedule followup in 4 months for diabetes mellitus and weight check, call sooner if problems.

## 2011-12-16 NOTE — Assessment & Plan Note (Signed)
eval for cough summer 2012 - felt RAD per Dr. Maple Hudson Pt using [facility]ight again with improved cough symptoms -  continue same through "bad winter and allergy season" for wheeze symptoms

## 2011-12-16 NOTE — Assessment & Plan Note (Signed)
On metformin -  The current medical regimen is effective;  continue present plan and medications. Check a1c now Lab Results  Component Value Date   HGBA1C 6.4 09/14/2011

## 2012-02-18 ENCOUNTER — Other Ambulatory Visit: Payer: Self-pay | Admitting: Internal Medicine

## 2012-02-29 ENCOUNTER — Encounter: Payer: Self-pay | Admitting: Internal Medicine

## 2012-02-29 ENCOUNTER — Ambulatory Visit (INDEPENDENT_AMBULATORY_CARE_PROVIDER_SITE_OTHER): Payer: BC Managed Care – PPO | Admitting: Internal Medicine

## 2012-02-29 ENCOUNTER — Encounter: Payer: Self-pay | Admitting: *Deleted

## 2012-02-29 VITALS — BP 134/78 | HR 106 | Temp 99.9°F

## 2012-02-29 DIAGNOSIS — H109 Unspecified conjunctivitis: Secondary | ICD-10-CM

## 2012-02-29 DIAGNOSIS — J019 Acute sinusitis, unspecified: Secondary | ICD-10-CM

## 2012-02-29 MED ORDER — POLYMYXIN B-TRIMETHOPRIM 10000-0.1 UNIT/ML-% OP SOLN: 1.0000 [drp] | OPHTHALMIC | Status: AC

## 2012-02-29 MED ORDER — LEVOFLOXACIN 500 MG PO TABS: 500.0000 mg | ORAL_TABLET | Freq: Every day | ORAL | Status: AC

## 2012-02-29 NOTE — Patient Instructions (Signed)
It was good to see you today. Levaquin antibiotics and Polytrim eye antibiotics drops - Your prescription(s) have been submitted to your pharmacy. Please take as directed and contact our office if you believe you are having problem(s) with the medication(s). Continue other medications for cough and asthma as ongoing Work excuse for next 2 days as discussed Call if symptoms worse or if unimproved after next 2 weeks following treatment

## 2012-02-29 NOTE — Progress Notes (Signed)
  Subjective:    HPI  complains of cold symptoms  Onset >1 week ago, progressive symptoms  associated with rhinorrhea, sneezing, sore throat, mild headache and low grade fever - Also myalgias, sinus pressure and mod chest congestion - yellow brown mucus from nares>chest "Pink eye" symptoms developed on R eye in past 24h - matting and discharge but no eye pain or vision change No relief with OTC meds Precipitated by sick contacts  Past Medical History  Diagnosis Date  . Obesity, unspecified   . OSTEOPENIA   . URINARY INCONTINENCE   . DEPRESSION   . DIABETES MELLITUS, TYPE II   . HYPERLIPIDEMIA   . Diverticulosis   . Asthma   . GERD (gastroesophageal reflux disease) 10/2011    severe    Review of Systems Constitutional: No night sweats, no unexpected weight change Pulmonary: No pleurisy or hemoptysis Cardiovascular: No chest pain or palpitations     Objective:   Physical Exam BP 134/78  Pulse 106  Temp(Src) 99.9 F (37.7 C) (Oral)  SpO2 94% GEN: mildly ill appearing and audible head/chest congestion HENT: NCAT, mild sinus tenderness bilaterally, nares with clear discharge, oropharynx mild erythema, no exudate Eyes: Vision grossly intact, mild conjunctivitis Lungs: Clear to auscultation without rhonchi or wheeze, no increased work of breathing Cardiovascular: Regular rate and rhythm, no bilateral edema      Assessment & Plan:  Viral URI>  Acute sinusitis with tendency towards bronchitis with RAD hx Cough, postnasal drip related to above Conjunctivitis, R eye    Empiric antibiotics prescribed due to symptom duration greater than 7 days - also polytrim eye antibiotics treatment  Continue prescription cough suppression and asthma meds at before acute illness-  Symptomatic care with Tylenol or Advil, hydration and rest -  salt gargle advised as needed

## 2012-04-15 ENCOUNTER — Encounter: Payer: Self-pay | Admitting: Internal Medicine

## 2012-04-18 ENCOUNTER — Ambulatory Visit (INDEPENDENT_AMBULATORY_CARE_PROVIDER_SITE_OTHER): Payer: BC Managed Care – PPO | Admitting: Internal Medicine

## 2012-04-18 ENCOUNTER — Other Ambulatory Visit (INDEPENDENT_AMBULATORY_CARE_PROVIDER_SITE_OTHER): Payer: BC Managed Care – PPO

## 2012-04-18 ENCOUNTER — Encounter: Payer: Self-pay | Admitting: Internal Medicine

## 2012-04-18 VITALS — BP 130/78 | HR 98 | Temp 98.1°F | Ht 62.0 in | Wt 193.0 lb

## 2012-04-18 DIAGNOSIS — E785 Hyperlipidemia, unspecified: Secondary | ICD-10-CM

## 2012-04-18 DIAGNOSIS — E119 Type 2 diabetes mellitus without complications: Secondary | ICD-10-CM

## 2012-04-18 DIAGNOSIS — E669 Obesity, unspecified: Secondary | ICD-10-CM

## 2012-04-18 LAB — HEMOGLOBIN A1C: Hgb A1c MFr Bld: 6.6 % — ABNORMAL HIGH (ref 4.6–6.5)

## 2012-04-18 NOTE — Patient Instructions (Signed)
It was good to see you today. Test(s) ordered today. Your results will be called to you after review (48-72hours after test completion). If any changes need to be made, you will be notified at that time.  Medications reviewed, no changes at this time.  Please schedule followup in 4 months for medical physical and labs including diabetes mellitus and weight check, call sooner if problems.

## 2012-04-18 NOTE — Assessment & Plan Note (Signed)
Wt Readings from Last 3 Encounters:  04/18/12 193 lb (87.544 kg)  12/16/11 188 lb 12.8 oz (85.639 kg)  10/30/11 187 lb (84.823 kg)   Reviewed importance of exercise and diet to control same, especially with DM and other comorbid dz

## 2012-04-18 NOTE — Progress Notes (Signed)
  Subjective:    Patient ID: Faith Orr, female    DOB: 12/23/1940, 71 y.o.   MRN: [id number]  HPI here for follow up - reviewed chronic medical issues:  DM 2 - has been checking sugars q AM - range 80s-120s fasting watches diet but always heavy - "life member weight watchers" had been to nutritionist and eating more veggies reports compliance with ongoing medical treatment and no changes in medication dose or frequency. denies adverse side effects related to current therapy.  dyslipidemia - on statin for same - reports compliance with ongoing medical treatment and no changes in medication dose or frequency. denies adverse side effects related to current therapy.  no muscle or GI upset  depression - reports compliance with ongoing medical treatment and no changes in medication dose or frequency. denies adverse side effects related to current therapy. no si/hi  osteopenia -last dexa 10/2011 -reports compliance with ongoing medical treatment and changes back to actonel from [place] due to cost. denies adverse side effects related to current therapy.    Past Medical History  Diagnosis Date  . Obesity, unspecified   . OSTEOPENIA   . URINARY INCONTINENCE   . DEPRESSION   . DIABETES MELLITUS, TYPE II   . HYPERLIPIDEMIA   . Diverticulosis   . Asthma   . GERD (gastroesophageal reflux disease) 10/2011    severe    Review of Systems  Constitutional: Negative for activity change and unexpected weight change.  Eyes: Negative for visual disturbance.  Respiratory: Negative for shortness of breath and wheezing.   Cardiovascular: Negative for chest pain.       Objective:   Physical Exam  BP 130/78  Pulse 98  Temp(Src) 98.1 F (36.7 C) (Oral)  Ht 5\' 2"  (1.575 m)  Wt 193 lb (87.544 kg)  BMI 35.30 kg/m2  SpO2 96% Wt Readings from Last 3 Encounters:  04/18/12 193 lb (87.544 kg)  12/16/11 188 lb 12.8 oz (85.639 kg)  10/30/11 187 lb (84.823 kg)   Constitutional: She is overweight;  She appears well-developed and well-nourished - NAD Neck: Normal range of motion. Neck supple. No JVD present. No thyromegaly present.  Cardiovascular: Normal rate, regular rhythm and normal heart sounds.  No murmur heard. No BLE edema. Pulmonary/Chest: Effort normal. No respiratory distress. She has no wheezes. no crackles Psyc: normal mood and affect; good judgement and insight      Lab Results  Component Value Date   WBC 8.6 05/23/2011   HGB 14.3 05/23/2011   HCT 40.6 05/23/2011   PLT 233 05/23/2011   CHOL 192 05/20/2011   TRIG 267.0* 05/20/2011   HDL 52.20 05/20/2011   LDLDIRECT 117.3 05/20/2011   ALT 29 05/20/2011   AST 37 05/20/2011   NA 140 05/23/2011   K 4.2 05/26/2011   CL 106 05/23/2011   CREATININE 0.71 05/23/2011   BUN 9 05/23/2011   CO2 20 05/23/2011   TSH 0.81 05/20/2011   HGBA1C 6.6* 12/16/2011   MICROALBUR 0.9 12/16/2011    Assessment & Plan:  See problem list. Medications and labs reviewed today.

## 2012-04-18 NOTE — Assessment & Plan Note (Signed)
On statin - reviewed last lipids from cpx - The current medical regimen is effective;  continue present plan and medications.  

## 2012-04-18 NOTE — Assessment & Plan Note (Signed)
On metformin -  The current medical regimen is effective;  continue present plan and medications. Check a1c now Lab Results  Component Value Date   HGBA1C 6.6* 12/16/2011

## 2012-04-26 ENCOUNTER — Other Ambulatory Visit: Payer: Self-pay | Admitting: Internal Medicine

## 2012-05-25 ENCOUNTER — Other Ambulatory Visit: Payer: Self-pay | Admitting: Internal Medicine

## 2012-06-06 ENCOUNTER — Encounter: Payer: Self-pay | Admitting: Internal Medicine

## 2012-06-06 ENCOUNTER — Ambulatory Visit (AMBULATORY_SURGERY_CENTER): Payer: BC Managed Care – PPO | Admitting: *Deleted

## 2012-06-06 VITALS — Ht 59.75 in | Wt 188.0 lb

## 2012-06-06 DIAGNOSIS — Z1211 Encounter for screening for malignant neoplasm of colon: Secondary | ICD-10-CM

## 2012-06-06 MED ORDER — MOVIPREP 100 G PO SOLR: ORAL | Status: DC

## 2012-06-15 ENCOUNTER — Encounter: Payer: BC Managed Care – PPO | Admitting: Internal Medicine

## 2012-06-20 LAB — HM DIABETES EYE EXAM

## 2012-06-22 ENCOUNTER — Telehealth: Payer: Self-pay | Admitting: Internal Medicine

## 2012-06-22 NOTE — Telephone Encounter (Signed)
Forward 1 page to Dr. Rene Paci for review on 06-22-12 ym

## 2012-06-23 ENCOUNTER — Other Ambulatory Visit: Payer: Self-pay | Admitting: *Deleted

## 2012-06-23 MED ORDER — SERTRALINE HCL 50 MG PO TABS: 50.0000 mg | ORAL_TABLET | Freq: Every day | ORAL | Status: DC

## 2012-06-23 MED ORDER — METFORMIN HCL ER 500 MG PO TB24: 500.0000 mg | ORAL_TABLET | Freq: Two times a day (BID) | ORAL | Status: DC

## 2012-06-23 NOTE — Telephone Encounter (Signed)
Pt wanting 90 day supply on her metformin & sertraline.Marland KitchenMarland KitchenMarland Kitchen7/11/13@11 :36am/LMB

## 2012-06-24 ENCOUNTER — Encounter: Payer: Self-pay | Admitting: Internal Medicine

## 2012-08-03 ENCOUNTER — Other Ambulatory Visit: Payer: Self-pay | Admitting: Obstetrics & Gynecology

## 2012-08-03 DIAGNOSIS — Z1231 Encounter for screening mammogram for malignant neoplasm of breast: Secondary | ICD-10-CM

## 2012-08-24 ENCOUNTER — Encounter: Payer: BC Managed Care – PPO | Admitting: Internal Medicine

## 2012-08-30 ENCOUNTER — Encounter: Payer: BC Managed Care – PPO | Admitting: Internal Medicine

## 2012-09-01 ENCOUNTER — Encounter: Payer: Self-pay | Admitting: Internal Medicine

## 2012-09-05 ENCOUNTER — Ambulatory Visit (INDEPENDENT_AMBULATORY_CARE_PROVIDER_SITE_OTHER): Payer: Medicare Other | Admitting: Internal Medicine

## 2012-09-05 ENCOUNTER — Encounter: Payer: Self-pay | Admitting: Internal Medicine

## 2012-09-05 ENCOUNTER — Other Ambulatory Visit (INDEPENDENT_AMBULATORY_CARE_PROVIDER_SITE_OTHER): Payer: BC Managed Care – PPO

## 2012-09-05 VITALS — BP 130/76 | HR 80 | Temp 98.1°F | Resp 16 | Wt 192.4 lb

## 2012-09-05 DIAGNOSIS — E119 Type 2 diabetes mellitus without complications: Secondary | ICD-10-CM

## 2012-09-05 DIAGNOSIS — Z0000 Encounter for general adult medical examination without abnormal findings: Secondary | ICD-10-CM

## 2012-09-05 DIAGNOSIS — R5381 Other malaise: Secondary | ICD-10-CM

## 2012-09-05 DIAGNOSIS — E785 Hyperlipidemia, unspecified: Secondary | ICD-10-CM

## 2012-09-05 DIAGNOSIS — R5383 Other fatigue: Secondary | ICD-10-CM

## 2012-09-05 DIAGNOSIS — Z23 Encounter for immunization: Secondary | ICD-10-CM

## 2012-09-05 LAB — CBC WITH DIFFERENTIAL/PLATELET
Basophils Absolute: 0.1 10*3/uL (ref 0.0–0.1)
Eosinophils Absolute: 0.2 10*3/uL (ref 0.0–0.7)
Hemoglobin: 14.5 g/dL (ref 12.0–15.0)
Lymphocytes Relative: 25.1 % (ref 12.0–46.0)
Lymphs Abs: 2.2 10*3/uL (ref 0.7–4.0)
MCHC: 33.9 g/dL (ref 30.0–36.0)
Monocytes Relative: 10.6 % (ref 3.0–12.0)
Neutro Abs: 5.3 10*3/uL (ref 1.4–7.7)
Platelets: 268 10*3/uL (ref 150.0–400.0)
RDW: 12.1 % (ref 11.5–14.6)

## 2012-09-05 LAB — HEMOGLOBIN A1C: Hgb A1c MFr Bld: 6.1 % (ref 4.6–6.5)

## 2012-09-05 MED ORDER — LORATADINE 10 MG PO TABS: 10.0000 mg | ORAL_TABLET | Freq: Every day | ORAL | Status: DC

## 2012-09-05 NOTE — Assessment & Plan Note (Signed)
On statin - reviewed lipids annually - The current medical regimen is effective;  continue present plan and medications.  

## 2012-09-05 NOTE — Patient Instructions (Signed)
It was good to see you today. Health Maintenance reviewed - flu shot today and colonoscopy as planned in Oct- all other recommended immunizations and age-appropriate screenings are up-to-date. Test(s) ordered today. Your results will be called to you after review (48-72hours after test completion). If any changes need to be made, you will be notified at that time.  Medications reviewed, no changes at this time.  Please schedule followup in 6 months for diabetes mellitus check and weight check, call sooner if problems.  Health Maintenance, Females A healthy lifestyle and preventative care can promote health and wellness.  Maintain regular health, dental, and eye exams.   Eat a healthy diet. Foods like vegetables, fruits, whole grains, low-fat dairy products, and lean protein foods contain the nutrients you need without too many calories. Decrease your intake of foods high in solid fats, added sugars, and salt. Get information about a proper diet from your caregiver, if necessary.   Regular physical exercise is one of the most important things you can do for your health. Most adults should get at least 150 minutes of moderate-intensity exercise (any activity that increases your heart rate and causes you to sweat) each week. In addition, most adults need muscle-strengthening exercises on 2 or more days a week.     Maintain a healthy weight. The body mass index (BMI) is a screening tool to identify possible weight problems. It provides an estimate of body fat based on height and weight. Your caregiver can help determine your BMI, and can help you achieve or maintain a healthy weight. For adults 20 years and older:   A BMI below 18.5 is considered underweight.   A BMI of 18.5 to 24.9 is normal.   A BMI of 25 to 29.9 is considered overweight.   A BMI of 30 and above is considered obese.   Maintain normal blood lipids and cholesterol by exercising and minimizing your intake of saturated fat. Eat a  balanced diet with plenty of fruits and vegetables. Blood tests for lipids and cholesterol should begin at age 7 and be repeated every 5 years. If your lipid or cholesterol levels are high, you are over 50, or you are a high risk for heart disease, you may need your cholesterol levels checked more frequently. Ongoing high lipid and cholesterol levels should be treated with medicines if diet and exercise are not effective.   If you smoke, find out from your caregiver how to quit. If you do not use tobacco, do not start.   If you are pregnant, do not drink alcohol. If you are breastfeeding, be very cautious about drinking alcohol. If you are not pregnant and choose to drink alcohol, do not exceed 1 drink per day. One drink is considered to be 12 ounces (355 mL) of beer, 5 ounces (148 mL) of wine, or 1.5 ounces (44 mL) of liquor.   Avoid use of street drugs. Do not share needles with anyone. Ask for help if you need support or instructions about stopping the use of drugs.   High blood pressure causes heart disease and increases the risk of stroke. Blood pressure should be checked at least every 1 to 2 years. Ongoing high blood pressure should be treated with medicines, if weight loss and exercise are not effective.   If you are 62 to 69 years old, ask your caregiver if you should take aspirin to prevent strokes.   Diabetes screening involves taking a blood sample to check your fasting blood sugar level.  This should be done once every 3 years, after age 90, if you are within normal weight and without risk factors for diabetes. Testing should be considered at a younger age or be carried out more frequently if you are overweight and have at least 1 risk factor for diabetes.   Breast cancer screening is essential preventative care for women. You should practice "breast self-awareness." This means understanding the normal appearance and feel of your breasts and may include breast self-examination. Any changes  detected, no matter how small, should be reported to a caregiver. Women in their 40s and 30s should have a clinical breast exam (CBE) by a caregiver as part of a regular health exam every 1 to 3 years. After age 76, women should have a CBE every year. Starting at age 24, women should consider having a mammogram (breast X-ray) every year. Women who have a family history of breast cancer should talk to their caregiver about genetic screening. Women at a high risk of breast cancer should talk to their caregiver about having an MRI and a mammogram every year.   The Pap test is a screening test for cervical cancer. Women should have a Pap test starting at age 34. Between ages 57 and 13, Pap tests should be repeated every 2 years. Beginning at age 69, you should have a Pap test every 3 years as long as the past 3 Pap tests have been normal. If you had a hysterectomy for a problem that was not cancer or a condition that could lead to cancer, then you no longer need Pap tests. If you are between ages 45 and 36, and you have had normal Pap tests going back 10 years, you no longer need Pap tests. If you have had past treatment for cervical cancer or a condition that could lead to cancer, you need Pap tests and screening for cancer for at least 20 years after your treatment. If Pap tests have been discontinued, risk factors (such as a new sexual partner) need to be reassessed to determine if screening should be resumed. Some women have medical problems that increase the chance of getting cervical cancer. In these cases, your caregiver may recommend more frequent screening and Pap tests.   The human papillomavirus (HPV) test is an additional test that may be used for cervical cancer screening. The HPV test looks for the virus that can cause the cell changes on the cervix. The cells collected during the Pap test can be tested for HPV. The HPV test could be used to screen women aged 69 years and older, and should be used in  women of any age who have unclear Pap test results. After the age of 2, women should have HPV testing at the same frequency as a Pap test.   Colorectal cancer can be detected and often prevented. Most routine colorectal cancer screening begins at the age of 57 and continues through age 32. However, your caregiver may recommend screening at an earlier age if you have risk factors for colon cancer. On a yearly basis, your caregiver may provide home test kits to check for hidden blood in the stool. Use of a small camera at the end of a tube, to directly examine the colon (sigmoidoscopy or colonoscopy), can detect the earliest forms of colorectal cancer. Talk to your caregiver about this at age 43, when routine screening begins. Direct examination of the colon should be repeated every 5 to 10 years through age 52, unless early forms of  pre-cancerous polyps or small growths are found.   Hepatitis C blood testing is recommended for all people born from [birth year] through 1963 and any individual with known risks for hepatitis C.   Practice safe sex. Use condoms and avoid high-risk sexual practices to reduce the spread of sexually transmitted infections (STIs). Sexually active women aged 27 and younger should be checked for Chlamydia, which is a common sexually transmitted infection. Older women with new or multiple partners should also be tested for Chlamydia. Testing for other STIs is recommended if you are sexually active and at increased risk.   Osteoporosis is a disease in which the bones lose minerals and strength with aging. This can result in serious bone fractures. The risk of osteoporosis can be identified using a bone density scan. Women ages 34 and over and women at risk for fractures or osteoporosis should discuss screening with their caregivers. Ask your caregiver whether you should be taking a calcium supplement or vitamin D to reduce the rate of osteoporosis.   Menopause can be associated with physical  symptoms and risks. Hormone replacement therapy is available to decrease symptoms and risks. You should talk to your caregiver about whether hormone replacement therapy is right for you.   Use sunscreen with a sun protection factor (SPF) of 30 or greater. Apply sunscreen liberally and repeatedly throughout the day. You should seek shade when your shadow is shorter than you. Protect yourself by wearing long sleeves, pants, a wide-brimmed hat, and sunglasses year round, whenever you are outdoors.   Notify your caregiver of new moles or changes in moles, especially if there is a change in shape or color. Also notify your caregiver if a mole is larger than the size of a pencil eraser.   Stay current with your immunizations.  Document Released: 06/15/2011 Document Revised: 11/19/2011 Document Reviewed: 06/15/2011 [facility] Patient Information 2012 [place], [place].

## 2012-09-05 NOTE — Assessment & Plan Note (Signed)
On metformin -  The current medical regimen is effective;  continue present plan and medications. Check a1c now Lab Results  Component Value Date   HGBA1C 6.6* 04/18/2012

## 2012-09-05 NOTE — Progress Notes (Signed)
Subjective:    Patient ID: Faith Orr, female    DOB: Sep 10, 1941, 71 y.o.   MRN: [id number]  HPI  Here for medicare wellness  Diet: heart healthy and diabetic Physical activity: sedentary Depression/mood screen: negative Hearing: intact to whispered voice Visual acuity: grossly normal, performs annual eye exam  ADLs: capable Fall risk: none Home safety: good Cognitive evaluation: intact to orientation, naming, recall and repetition EOL planning: adv directives, full code/ I agree  I have personally reviewed and have noted 1. The patient's medical and social history 2. Their use of alcohol, tobacco or illicit drugs 3. Their current medications and supplements 4. The patient's functional ability including ADL's, fall risks, home safety risks and hearing or visual impairment. 5. Diet and physical activities 6. Evidence for depression or mood disorders  Also reviewed chronic medical issues:  DM 2 - has been checking sugars q AM - range 80s-120s fasting watches diet but always heavy - "life member weight watchers" had been to nutritionist and eating more veggies reports compliance with ongoing medical treatment and no changes in medication dose or frequency. denies adverse side effects related to current therapy.  dyslipidemia - on statin for same - reports compliance with ongoing medical treatment and no changes in medication dose or frequency. denies adverse side effects related to current therapy.  no muscle or GI upset  depression - reports compliance with ongoing medical treatment and no changes in medication dose or frequency. denies adverse side effects related to current therapy. no si/hi  osteopenia -last dexa 10/2011 -reports compliance with ongoing medical treatment and changes back to actonel from [place] due to cost. denies adverse side effects related to current therapy.    Past Medical History  Diagnosis Date  . Obesity, unspecified   . OSTEOPENIA   . URINARY  INCONTINENCE   . DEPRESSION   . DIABETES MELLITUS, TYPE II   . HYPERLIPIDEMIA   . Diverticulosis   . Asthma   . GERD (gastroesophageal reflux disease) 10/2011    severe  . Allergy     seasonal   Family History  Problem Relation Age of Onset  . Diabetes Mother   . Hyperlipidemia Mother   . Diabetes Father   . Hyperlipidemia Father   . Multiple sclerosis Daughter   . Cancer      bladder  . Colon cancer Paternal Grandmother    History  Substance Use Topics  . Smoking status: Never Smoker   . Smokeless tobacco: Never Used   Comment: Lives with partner [name]) and son  . Alcohol Use: No    Review of Systems  Constitutional: Positive for fatigue. Negative for activity change and unexpected weight change.  Eyes: Negative for visual disturbance.  Respiratory: Negative for shortness of breath and wheezing.   Cardiovascular: Negative for chest pain.  Gastrointestinal: Negative for abdominal pain, no bowel changes.  Musculoskeletal: Negative for gait problem or joint swelling.  Skin: Negative for rash.  Neurological: Negative for dizziness or headache.  No other specific complaints in a complete review of systems (except as listed in HPI above).      Objective:   Physical Exam  BP 130/76  Pulse 80  Temp 98.1 F (36.7 C) (Oral)  Resp 16  Wt 192 lb 6 oz (87.261 kg)  SpO2 95% Wt Readings from Last 3 Encounters:  09/05/12 192 lb 6 oz (87.261 kg)  06/06/12 188 lb (85.276 kg)  04/18/12 193 lb (87.544 kg)   Constitutional: She is overweight,  but appears well-developed and well-nourished. No distress.  HENT: Head: Normocephalic and atraumatic. Ears: B TMs ok, no erythema or effusion; Nose: Nose normal. Mouth/Throat: Oropharynx is clear and moist. No oropharyngeal exudate.  Eyes: Conjunctivae and EOM are normal. Pupils are equal, round, and reactive to light. No scleral icterus.  Neck: Normal range of motion. Neck supple. No JVD present. No thyromegaly present.    Cardiovascular: Normal rate, regular rhythm and normal heart sounds.  No murmur heard. No BLE edema. Pulmonary/Chest: Effort normal and breath sounds normal. No respiratory distress. She has no wheezes.  Abdominal: Soft. Bowel sounds are normal. She exhibits no distension. There is no tenderness. no masses Musculoskeletal: Normal range of motion, no joint effusions. No gross deformities Neurological: She is alert and oriented to person, place, and time. No cranial nerve deficit. Coordination normal.  Skin: Skin is warm and dry. No rash noted. No erythema.  Psychiatric: She has a normal mood and affect. Her behavior is normal. Judgment and thought content normal.        Lab Results  Component Value Date   WBC 8.6 05/23/2011   HGB 14.3 05/23/2011   HCT 40.6 05/23/2011   PLT 233 05/23/2011   CHOL 192 05/20/2011   TRIG 267.0* 05/20/2011   HDL 52.20 05/20/2011   LDLDIRECT 117.3 05/20/2011   ALT 29 05/20/2011   AST 37 05/20/2011   NA 140 05/23/2011   K 4.2 05/26/2011   CL 106 05/23/2011   CREATININE 0.71 05/23/2011   BUN 9 05/23/2011   CO2 20 05/23/2011   TSH 0.81 05/20/2011   HGBA1C 6.6* 04/18/2012   MICROALBUR 0.9 12/16/2011    Assessment & Plan:  AWV/CPX/v70.0 - Patient has been counseled on age-appropriate routine health concerns for screening and prevention. These are reviewed and up-to-date. Immunizations are up-to-date or declined. Labs reviewed.  Fatigue - nonspecific symptoms/exam - check screening labs  Also see problem list. Medications and labs reviewed today.

## 2012-09-06 LAB — HEPATIC FUNCTION PANEL
ALT: 30 U/L (ref 0–35)
AST: 34 U/L (ref 0–37)
Alkaline Phosphatase: 79 U/L (ref 39–117)
Bilirubin, Direct: 0.1 mg/dL (ref 0.0–0.3)
Total Bilirubin: 0.6 mg/dL (ref 0.3–1.2)

## 2012-09-06 LAB — TSH: TSH: 1.18 u[IU]/mL (ref 0.35–5.50)

## 2012-09-06 LAB — BASIC METABOLIC PANEL
BUN: 22 mg/dL (ref 6–23)
CO2: 26 mEq/L (ref 19–32)
Calcium: 10.4 mg/dL (ref 8.4–10.5)
Creatinine, Ser: 0.8 mg/dL (ref 0.4–1.2)
GFR: 81.4 mL/min (ref >60.00)
Glucose, Bld: 98 mg/dL (ref 70–99)
Sodium: 141 mEq/L (ref 135–145)

## 2012-09-06 LAB — LIPID PANEL: Cholesterol: 186 mg/dL (ref 0–200)

## 2012-09-06 LAB — LDL CHOLESTEROL, DIRECT: Direct LDL: 99.9 mg/dL

## 2012-09-07 ENCOUNTER — Ambulatory Visit
Admission: RE | Admit: 2012-09-07 | Discharge: 2012-09-07 | Disposition: A | Discharge status: Home / Self Care | Payer: BC Managed Care – PPO | Source: Ambulatory Visit | Attending: Obstetrics & Gynecology | Admitting: Obstetrics & Gynecology

## 2012-09-07 DIAGNOSIS — Z1231 Encounter for screening mammogram for malignant neoplasm of breast: Secondary | ICD-10-CM

## 2012-09-23 ENCOUNTER — Ambulatory Visit: Payer: BC Managed Care – PPO | Admitting: Internal Medicine

## 2012-09-28 ENCOUNTER — Ambulatory Visit (INDEPENDENT_AMBULATORY_CARE_PROVIDER_SITE_OTHER): Payer: Medicare Other | Admitting: Internal Medicine

## 2012-09-28 ENCOUNTER — Encounter: Payer: Self-pay | Admitting: Internal Medicine

## 2012-09-28 VITALS — BP 108/72 | HR 88 | Ht 60.0 in | Wt 195.4 lb

## 2012-09-28 DIAGNOSIS — R05 Cough: Secondary | ICD-10-CM

## 2012-09-28 DIAGNOSIS — J40 Bronchitis, not specified as acute or chronic: Secondary | ICD-10-CM

## 2012-09-28 MED ORDER — ALBUTEROL SULFATE HFA 108 (90 BASE) MCG/ACT IN AERS: 2.0000 | INHALATION_SPRAY | RESPIRATORY_TRACT | Status: DC | PRN

## 2012-09-28 NOTE — Progress Notes (Signed)
Subjective:    Patient ID: Faith Orr, female    DOB: 02-14-1941, 71 y.o.   MRN: [id number]  HPI 05/28/11- 38 yoF never smoker, referred courtesy of Dr Felicity Coyer because of cough and abnormal xray. She describes a cold with sinus congestion and cough in April. Her head cleared after treatment with levaquin and prednisone, but cough persisted. Productive brown sputum. Treated Z pak and prednisone, cough persisted. At f/u June 6, CXR > chronic calcified granulomas. CT chest 05/22/11- confirmed calcified granulomas left hilum, mediastinum, spleen; no adenopathy,possible liver cysts. Cough was tight and she had trouble getting in an inhaler at first. ER neb helped. Cough now is only dry and she notes persistent hoarseness. These are her main residual complaint, feeling much improved. Initially felt a tight sternal pressure with expiratory wheeze and some shortness of breath. Still a little tightness, but no pain.Says she is "vastly better than before".  On prilosec now empirically, but little hx of heart burn. PPD always negative, but long ago.  06/18/11- 69 yoF never smoker, referred courtesy of Dr Felicity Coyer because of cough and abnormal xray.  CT chest 05/22/11- confirmed calcified granulomas left hilum, mediastinum, spleen; no adenopathy,possible liver cysts. Hoarseness.-She reports hoarseness largely gone. During last week in June, at beach,  she had another bout with cough, after being fine the day before. Was audibly wheezing and short of breath for the week before the cough started- beginning in [place] before going to the beach. We called in doxy which did clear the mucus. She now reports minimal morning cough. Nose doing well so she is not using nasal spray. She can now take deep breath to get in [place]. Chest is non longer tight, but she feels slight upper tracheal tickle.  08/24/11- 70 year old female never smoker followed for cough, bronchitis, calcified granulomas on chest x-ray, complicated by  diabetes. Taking prn claritin for congestion and inhalers also as needed. Still easily gets tight in chest- with yard work in [employer]- minimal compared to her worst. May note a little wheeze with rushing around. Cigarette smoke burns in her chest. She has used [facility] some as a rescue inhaler and we discussed meds. Educated rescue vs maintenance inhalers. PFT- reactive small airways FEF25-75% improved from 68 to 125%.  Had tetanus shot 2 years ago with Dr Felicity Coyer- she asks about pertussis vaccine and will call Dr Felicity Coyer to ask if what she had was TDAP then. Wants flu shot  09/28/12- 71 year old female never smoker followed for cough, bronchitis, calcified granulomas on chest x-ray, complicated by diabetes. States has cough and SOB if doing heavy cleaning and such; did get rid of her cat as it started bothering her. Has had flu vaccine. This has been a very good year. 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 Not needing her inhalers but admits constant substernal tickle which is increased by dust.   Review of Systems- see HPI Constitutional:   No weight loss, night sweats,  Fevers, chills, fatigue, lassitude. HEENT:   No headaches,  Difficulty swallowing,  Tooth/dental problems,  Sore throat,                No sneezing, itching, ear ache, nasal congestion, post nasal drip,  CV:  No chest pain,  Orthopnea, PND, swelling in lower extremities, anasarca, dizziness, palpitations GI  No heartburn, indigestion, abdominal pain, nausea, vomiting,  Resp:   No coughing up of blood.  No change in color of mucus.    Skin: no rash or  lesions. GU:  MS:  No joint pain or swelling.   Psych:  No change in mood or affect. No depression or anxiety.  No memory loss.  Objective:   Physical Exam General- Alert, Oriented, Affect-appropriate, Distress- none acute; obese Skin- rash-none, lesions- none, excoriation- none Lymphadenopathy- none Head- atraumatic            Eyes- Gross vision intact,  PERRLA, conjunctivae clear secretions            Ears- Hearing, canals normal            Nose- Clear, no-Septal dev, mucus, polyps, erosion, perforation             Throat- Mallampati II-III , mucosa clear , drainage- none, tonsils- atrophic Neck- flexible , trachea midline, no stridor , thyroid nl, carotid no bruit Chest - symmetrical excursion , unlabored           Heart/CV- RRR , no murmur , no gallop  , no rub, nl s1 s2                           - JVD- none , edema- none, stasis changes- none, varices- none           Lung- clear to P&A, wheeze- none, cough- none , dullness-none, rub- none           Chest wall-  Abd-  Br/ Gen/ Rectal- Not done, not indicated Extrem- cyanosis- none, clubbing, none, atrophy- none, strength- nl Neuro- grossly intact to observation   Assessment & Plan:

## 2012-09-28 NOTE — Patient Instructions (Addendum)
Script sent for albuterol HFA rescue inhaler    Dr Felicity Coyer can help going forward, but I will be happy to see you if needed

## 2012-10-07 NOTE — Assessment & Plan Note (Signed)
Substernal tickle maybe cough equivalent. We agreed that she would at least keep a rescue inhaler on hand.

## 2012-10-07 NOTE — Assessment & Plan Note (Signed)
Question very low grade tracheobronchitis. We discussed use of inhalers including maintenance controller Dulera.

## 2012-10-14 ENCOUNTER — Ambulatory Visit (AMBULATORY_SURGERY_CENTER): Payer: Medicare Other

## 2012-10-14 VITALS — Ht 60.0 in | Wt 194.8 lb

## 2012-10-14 DIAGNOSIS — Z800 Family history of malignant neoplasm of digestive organs: Secondary | ICD-10-CM

## 2012-10-14 DIAGNOSIS — Z1211 Encounter for screening for malignant neoplasm of colon: Secondary | ICD-10-CM

## 2012-10-16 ENCOUNTER — Other Ambulatory Visit: Payer: Self-pay | Admitting: Internal Medicine

## 2012-10-28 ENCOUNTER — Encounter: Payer: Self-pay | Admitting: Internal Medicine

## 2012-10-28 ENCOUNTER — Ambulatory Visit (AMBULATORY_SURGERY_CENTER): Payer: Medicare Other | Admitting: Internal Medicine

## 2012-10-28 VITALS — BP 117/70 | HR 67 | Temp 98.1°F | Resp 18 | Ht 60.0 in | Wt 194.0 lb

## 2012-10-28 DIAGNOSIS — Z1211 Encounter for screening for malignant neoplasm of colon: Secondary | ICD-10-CM

## 2012-10-28 DIAGNOSIS — Z800 Family history of malignant neoplasm of digestive organs: Secondary | ICD-10-CM

## 2012-10-28 DIAGNOSIS — D126 Benign neoplasm of colon, unspecified: Secondary | ICD-10-CM

## 2012-10-28 MED ORDER — SODIUM CHLORIDE 0.9 % IV SOLN: 500.0000 mL | INTRAVENOUS | Status: DC

## 2012-10-28 NOTE — Patient Instructions (Addendum)

## 2012-10-28 NOTE — Op Note (Signed)
[facility] [address] [place], [address]   COLONOSCOPY PROCEDURE REPORT  PATIENT: Faith Orr, Faith Orr  MR#: [id number] BIRTHDATE: May 01, 1941 , 71  yrs. old GENDER: Female ENDOSCOPIST: Hart Carwin, MD REFERRED [id number] colonosocpt PROCEDURE DATE:  10/28/2012 PROCEDURE:   Colonoscopy with cold biopsy polypectomy ASA CLASS:   Class II INDICATIONS:family history of colon cancer, distant relative(s) and last colon 07/2004, grandmother with colon cancer. MEDICATIONS: MAC sedation, administered by CRNA and propofol (Diprivan) 250mg  IV  DESCRIPTION OF PROCEDURE:   After the risks benefits and alternatives of the procedure were thoroughly explained, informed consent was obtained.  A digital rectal exam revealed no abnormalities of the rectum.   The LB [id number]-H180AL [id number]  endoscope was introduced through the anus and advanced to the cecum, which was identified by both the appendix and ileocecal valve. No adverse events experienced.   The quality of the prep was good, using MoviPrep  The instrument was then slowly withdrawn as the colon was fully examined.      COLON FINDINGS: A sessile polyp ranging between 3-64mm in size was found in the descending colon.  A polypectomy was performed with cold forceps.  The resection was complete and the polyp tissue was completely retrieved.   Mild diverticulosis was noted in the descending colon.  Retroflexed views revealed no abnormalities. The time to cecum=  .  Withdrawal time=  .  The scope was withdrawn and the procedure completed. COMPLICATIONS: There were no complications.  ENDOSCOPIC IMPRESSION: 1.   Sessile polyp ranging between 3-79mm in size was found in the descending colon at 80 cm,  polypectomy was performed with cold forceps 2.   Mild diverticulosis was noted in the descending colon  RECOMMENDATIONS: 1.  High fiber diet 2.  Await pathology results 3. Recall colonoscopy 10 years   eSigned:  Hart Carwin,  MD 10/28/2012 11:15 AM   cc:   PATIENT NAME:  Faith Orr, Faith Orr MR#: [id number]

## 2012-10-28 NOTE — Progress Notes (Signed)
Patient did not have preoperative order for IV antibiotic SSI prophylaxis. (G8918)  Patient did not experience any of the following events: a burn prior to discharge; a fall within the facility; wrong site/side/patient/procedure/implant event; or a hospital transfer or hospital admission upon discharge from the facility. (G8907)  

## 2012-10-31 ENCOUNTER — Telehealth: Payer: Self-pay

## 2012-10-31 NOTE — Telephone Encounter (Signed)
  Follow up Call-  Call back number 10/28/2012  Post procedure Call Back phone  # [phone]  Permission to leave phone message Yes     Patient questions:  Do you have a fever, pain , or abdominal swelling? no Pain Score  0 *  Have you tolerated food without any problems? yes  Have you been able to return to your normal activities? yes  Do you have any questions about your discharge instructions: Diet   no Medications  no Follow up visit  no  Do you have questions or concerns about your Care? no  Actions: * If pain score is 4 or above: No action needed, pain <4.

## 2012-11-01 ENCOUNTER — Encounter: Payer: Self-pay | Admitting: Internal Medicine

## 2012-12-21 ENCOUNTER — Other Ambulatory Visit: Payer: Self-pay | Admitting: *Deleted

## 2012-12-21 MED ORDER — SERTRALINE HCL 50 MG PO TABS: 50.0000 mg | ORAL_TABLET | Freq: Every day | ORAL | Status: DC

## 2012-12-21 NOTE — Telephone Encounter (Signed)
R'cd fax from [facility] for refill of Sertraline.

## 2013-01-17 ENCOUNTER — Other Ambulatory Visit: Payer: Self-pay | Admitting: Internal Medicine

## 2013-02-01 ENCOUNTER — Other Ambulatory Visit: Payer: Self-pay | Admitting: *Deleted

## 2013-02-01 MED ORDER — METFORMIN HCL ER 500 MG PO TB24: 250.0000 mg | ORAL_TABLET | Freq: Two times a day (BID) | ORAL | Status: DC

## 2013-03-06 ENCOUNTER — Ambulatory Visit (INDEPENDENT_AMBULATORY_CARE_PROVIDER_SITE_OTHER): Payer: Medicare Other | Admitting: Internal Medicine

## 2013-03-06 ENCOUNTER — Other Ambulatory Visit (INDEPENDENT_AMBULATORY_CARE_PROVIDER_SITE_OTHER): Payer: Medicare Other

## 2013-03-06 ENCOUNTER — Encounter: Payer: Self-pay | Admitting: Internal Medicine

## 2013-03-06 VITALS — BP 126/82 | HR 92 | Temp 97.0°F | Wt 196.6 lb

## 2013-03-06 DIAGNOSIS — E119 Type 2 diabetes mellitus without complications: Secondary | ICD-10-CM

## 2013-03-06 DIAGNOSIS — E669 Obesity, unspecified: Secondary | ICD-10-CM

## 2013-03-06 DIAGNOSIS — E785 Hyperlipidemia, unspecified: Secondary | ICD-10-CM

## 2013-03-06 LAB — MICROALBUMIN / CREATININE URINE RATIO
Creatinine,U: 141.9 mg/dL
Microalb Creat Ratio: 0.4 mg/g (ref 0.0–30.0)

## 2013-03-06 LAB — HM DIABETES FOOT EXAM

## 2013-03-06 LAB — HEMOGLOBIN A1C: Hgb A1c MFr Bld: 6.4 % (ref 4.6–6.5)

## 2013-03-06 NOTE — Patient Instructions (Signed)
It was good to see you today. Medications reviewed, no changes at this time.  Test(s) ordered today. Your results will be released to MyChart (or called to you) after review, usually within 72hours after test completion. If any changes need to be made, you will be notified at that same time. Please schedule followup in 6 months for medical physical and labs including diabetes mellitus and weight check, call sooner if problems.

## 2013-03-06 NOTE — Assessment & Plan Note (Signed)
On statin - reviewed lipids annually - The current medical regimen is effective;  continue present plan and medications.  

## 2013-03-06 NOTE — Assessment & Plan Note (Signed)
Wt Readings from Last 3 Encounters:  03/06/13 196 lb 9.6 oz (89.177 kg)  10/28/12 194 lb (87.998 kg)  10/14/12 194 lb 12.8 oz (88.361 kg)   Reviewed importance of exercise and diet to control same, especially with DM and other comorbid dz

## 2013-03-06 NOTE — Assessment & Plan Note (Signed)
On metformin -  The current medical regimen is effective;  continue present plan and medications. Check a1c now Lab Results  Component Value Date   HGBA1C 6.1 09/05/2012

## 2013-03-06 NOTE — Progress Notes (Signed)
  Subjective:    Patient ID: Faith Orr, female    DOB: [birth year], 72 y.o.   MRN: [id number]  HPI  Here for follow up -reviewed chronic medical issues:  DM 2 - has been checking sugars q AM - range 80s-120s fasting watches diet but always heavy - "life member weight watchers" reports compliance with ongoing medical treatment and no changes in medication dose or frequency. denies adverse side effects related to current therapy.  dyslipidemia - on statin for same - reports compliance with ongoing medical treatment and no changes in medication dose or frequency. denies adverse side effects related to current therapy.   depression - reports compliance with ongoing medical treatment and no changes in medication dose or frequency. denies adverse side effects related to current therapy. no si/hi  osteopenia -last dexa 10/2011 -reports compliance with ongoing medical treatment and changed back to actonel from [place] due to cost. denies adverse side effects related to current therapy.    Past Medical History  Diagnosis Date  . Obesity, unspecified   . OSTEOPENIA   . URINARY INCONTINENCE   . DEPRESSION   . DIABETES MELLITUS, TYPE II   . HYPERLIPIDEMIA   . Diverticulosis   . Asthma   . GERD (gastroesophageal reflux disease) 10/2011    severe  . Allergy     seasonal    Review of Systems  Constitutional: Positive for fatigue. Negative for activity change and unexpected weight change.  Eyes: Negative for visual disturbance.  Respiratory: Negative for shortness of breath and wheezing.   Cardiovascular: Negative for chest pain.       Objective:   Physical Exam  BP 126/82  Pulse 92  Temp(Src) 97 F (36.1 C) (Oral)  Wt 196 lb 9.6 oz (89.177 kg)  BMI 38.4 kg/m2  SpO2 96% Wt Readings from Last 3 Encounters:  03/06/13 196 lb 9.6 oz (89.177 kg)  10/28/12 194 lb (87.998 kg)  10/14/12 194 lb 12.8 oz (88.361 kg)   Constitutional: She is overweight, but appears well-developed and  well-nourished. No distress.   Neck: Normal range of motion. Neck supple. No JVD present. No thyromegaly present.  Cardiovascular: Normal rate, regular rhythm and normal heart sounds.  No murmur heard. No BLE edema. Pulmonary/Chest: Effort normal and breath sounds normal. No respiratory distress. She has no wheezes. Psychiatric: She has a normal mood and affect. Her behavior is normal. Judgment and thought content normal.        Lab Results  Component Value Date   WBC 8.6 09/05/2012   HGB 14.5 09/05/2012   HCT 42.7 09/05/2012   PLT 268.0 09/05/2012   CHOL 186 09/05/2012   TRIG 300.0* 09/05/2012   HDL 48.70 09/05/2012   LDLDIRECT 99.9 09/05/2012   ALT 30 09/05/2012   AST 34 09/05/2012   NA 141 09/05/2012   K 4.4 09/05/2012   CL 101 09/05/2012   CREATININE 0.8 09/05/2012   BUN 22 09/05/2012   CO2 26 09/05/2012   TSH 1.18 09/05/2012   HGBA1C 6.1 09/05/2012   MICROALBUR 0.9 12/16/2011    Assessment & Plan:   see problem list. Medications and labs reviewed today.

## 2013-03-27 ENCOUNTER — Ambulatory Visit: Payer: Self-pay | Admitting: Internal Medicine

## 2013-03-27 ENCOUNTER — Telehealth: Payer: Self-pay | Admitting: Internal Medicine

## 2013-03-27 NOTE — Telephone Encounter (Signed)
Patient Information:  Caller Name: Melea  Phone: [phone]  Patient: Faith Orr, Faith Orr  Gender: Female  DOB: 07/29/1941  Age: 72 Years  PCP: Rene Paci (Adults only)  Office Follow Up:  Does the office need to follow up with this patient?: No  Instructions For The Office: N/A  RN Note:  Last office visit 03/06/13. Not testing blood sugar. No wheezing today; noted wheezing when cleans home. Lots of coughing at night before lies down. Unable to take 1600 appointment so secheduled for 1615 03/27/13 with Dr. Felicity Coyer.   Symptoms  Reason For Call & Symptoms: Allergy symptoms with frequent cough, itchy eyes, throat and ears, runny eyes, stuffed sinuses, clear runny nose. Requesting Rx for Singulair and Hydrocodone syrup.  Reviewed Health History In EMR: Yes  Reviewed Medications In EMR: Yes  Reviewed Allergies In EMR: Yes  Reviewed Surgeries / Procedures: Yes  Date of Onset of Symptoms: 03/20/2013  Treatments Tried: Albuterol MDI, Claritin  Treatments Tried Worked: Yes  Guideline(s) Used:  Kmetz Fever - Nasal Allergies  Disposition Per Guideline:   See Today in Office  Reason For Disposition Reached:   Lots of coughing  Advice Given:  Wash off Pollen Daily:  Remove pollen from the body with hair washing and a shower, especially before bedtime.  Avoiding Pollen:  Stay indoors on windy days  Keep windows closed in home, at least in bedroom; use air conditioner  Keep windows closed in car, turn AC on recirculate  For a Stuffy Nose - Use Nasal Washes:  Introduction: Saline (salt water) nasal irrigation (nasal washes) is an effective and simple home remedy for treating stuffy nose and sinus congestion. The nose can be irrigated by pouring, spraying, or squirting salt water into the nose and then letting it run back out.  Antihistamine Medications for Basich Fever:  Antihistamines help reduce sneezing, itching, and runny nose.  You may need to take antihistamines continuously during  pollen season (Reason: continuously is the key to control).  Loratadine is a newer (second generation) antihistamine. The dosage of loratadine (e.g., OTC Claritin, Alavert) is 10 mg once a day.  Patient Will Follow Care Advice:  YES  Appointment Scheduled:  03/27/2013 16:15:00 Appointment Scheduled Provider:  Rene Paci (Adults only)

## 2013-03-28 ENCOUNTER — Encounter: Payer: Self-pay | Admitting: Internal Medicine

## 2013-03-28 ENCOUNTER — Ambulatory Visit (INDEPENDENT_AMBULATORY_CARE_PROVIDER_SITE_OTHER): Payer: 59 | Admitting: Internal Medicine

## 2013-03-28 VITALS — BP 134/80 | HR 90 | Temp 97.0°F | Wt 197.8 lb

## 2013-03-28 DIAGNOSIS — J309 Allergic rhinitis, unspecified: Secondary | ICD-10-CM

## 2013-03-28 DIAGNOSIS — J45909 Unspecified asthma, uncomplicated: Secondary | ICD-10-CM

## 2013-03-28 DIAGNOSIS — R05 Cough: Secondary | ICD-10-CM

## 2013-03-28 MED ORDER — MOMETASONE FURO-FORMOTEROL FUM 200-5 MCG/ACT IN AERO: 2.0000 | INHALATION_SPRAY | Freq: Two times a day (BID) | RESPIRATORY_TRACT | Status: DC

## 2013-03-28 MED ORDER — MONTELUKAST SODIUM 10 MG PO TABS: 10.0000 mg | ORAL_TABLET | Freq: Every day | ORAL | Status: DC

## 2013-03-28 MED ORDER — HYDROCODONE-HOMATROPINE 5-1.5 MG/5ML PO SYRP: 5.0000 mL | ORAL_SOLUTION | Freq: Every evening | ORAL | Status: DC | PRN

## 2013-03-28 MED ORDER — ALBUTEROL SULFATE HFA 108 (90 BASE) MCG/ACT IN AERS: 2.0000 | INHALATION_SPRAY | RESPIRATORY_TRACT | Status: DC | PRN

## 2013-03-28 MED ORDER — CETIRIZINE HCL 10 MG PO TABS: 5.0000 mg | ORAL_TABLET | Freq: Every day | ORAL | Status: DC

## 2013-03-28 NOTE — Assessment & Plan Note (Signed)
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

## 2013-03-28 NOTE — Patient Instructions (Signed)
It was good to see you today. We have reviewed your prior records including labs and tests today Change Claritin to half dose of Zyrtec daily for allergies Resume Dulera 2 puffs twice a day and okay to use albuterol if needed for cough or wheeze Start Singulair once daily Okay for hydrocodone syrup if needed for unresolved cough symptoms Your prescription(s) have been submitted to your pharmacy. Please take as directed and contact our office if you believe you are having problem(s) with the medication(s).

## 2013-03-28 NOTE — Assessment & Plan Note (Signed)
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

## 2013-03-28 NOTE — Progress Notes (Signed)
  Subjective:    Patient ID: Faith Orr, female    DOB: [birth year], 72 y.o.   MRN: [id number]  Cough This is a new problem. The current episode started in the past 7 days. The problem has been unchanged. The problem occurs every few minutes. The cough is non-productive. Associated symptoms include nasal congestion, postnasal drip and rhinorrhea. Pertinent negatives include no chest pain, ear congestion, ear pain, fever, headaches, heartburn, myalgias, rash, sore throat, shortness of breath or wheezing. The symptoms are aggravated by pollens. She has tried a beta-agonist inhaler for the symptoms. The treatment provided mild relief. Her past medical history is significant for asthma and environmental allergies.    Past Medical History  Diagnosis Date  . Obesity, unspecified   . OSTEOPENIA   . URINARY INCONTINENCE   . DEPRESSION   . DIABETES MELLITUS, TYPE II   . HYPERLIPIDEMIA   . Diverticulosis   . Asthma   . GERD (gastroesophageal reflux disease) 10/2011    severe  . Allergy     seasonal    Review of Systems  Constitutional: Positive for fatigue. Negative for fever, activity change and unexpected weight change.  HENT: Positive for rhinorrhea and postnasal drip. Negative for ear pain and sore throat.   Eyes: Negative for visual disturbance.  Respiratory: Positive for cough. Negative for shortness of breath and wheezing.   Cardiovascular: Negative for chest pain.  Gastrointestinal: Negative for heartburn.  Musculoskeletal: Negative for myalgias.  Skin: Negative for rash.  Allergic/Immunologic: Positive for environmental allergies.  Neurological: Negative for headaches.       Objective:   Physical Exam  BP 134/80  Pulse 90  Temp(Src) 97 F (36.1 C) (Oral)  Wt 197 lb 12.8 oz (89.721 kg)  BMI 38.63 kg/m2  SpO2 96% Wt Readings from Last 3 Encounters:  03/28/13 197 lb 12.8 oz (89.721 kg)  03/06/13 196 lb 9.6 oz (89.177 kg)  10/28/12 194 lb (87.998 kg)   Constitutional:  She is overweight, but appears well-developed and well-nourished. No distress.   HENT: sinus nontender, nares with pale membranes and clear discharge - OP mildly red with clear PND, no exudate Neck: Normal range of motion. Neck supple. No JVD or LAD present. No thyromegaly present.  Cardiovascular: Normal rate, regular rhythm and normal heart sounds.  No murmur heard. No BLE edema. Pulmonary/Chest: Effort normal and breath sounds normal. No respiratory distress. She has fine end wheezes. Dry cough with laughing effort Psychiatric: She has a normal mood and affect. Her behavior is normal. Judgment and thought content normal.        Lab Results  Component Value Date   WBC 8.6 09/05/2012   HGB 14.5 09/05/2012   HCT 42.7 09/05/2012   PLT 268.0 09/05/2012   CHOL 186 09/05/2012   TRIG 300.0* 09/05/2012   HDL 48.70 09/05/2012   LDLDIRECT 99.9 09/05/2012   ALT 30 09/05/2012   AST 34 09/05/2012   NA 141 09/05/2012   K 4.4 09/05/2012   CL 101 09/05/2012   CREATININE 0.8 09/05/2012   BUN 22 09/05/2012   CO2 26 09/05/2012   TSH 1.18 09/05/2012   HGBA1C 6.4 03/06/2013   MICROALBUR 0.5 03/06/2013    Assessment & Plan:   see problem list. Medications and labs reviewed today.

## 2013-03-29 ENCOUNTER — Other Ambulatory Visit: Payer: Self-pay | Admitting: *Deleted

## 2013-03-29 MED ORDER — SIMVASTATIN 20 MG PO TABS: ORAL_TABLET | ORAL | Status: DC

## 2013-04-17 ENCOUNTER — Ambulatory Visit (INDEPENDENT_AMBULATORY_CARE_PROVIDER_SITE_OTHER): Payer: 59 | Admitting: Obstetrics & Gynecology

## 2013-04-17 ENCOUNTER — Encounter: Payer: Self-pay | Admitting: Obstetrics & Gynecology

## 2013-04-17 ENCOUNTER — Telehealth: Payer: Self-pay | Admitting: *Deleted

## 2013-04-17 ENCOUNTER — Ambulatory Visit: Payer: Self-pay | Admitting: Obstetrics & Gynecology

## 2013-04-17 VITALS — BP 120/78 | Ht 59.75 in | Wt 192.6 lb

## 2013-04-17 DIAGNOSIS — Z01419 Encounter for gynecological examination (general) (routine) without abnormal findings: Secondary | ICD-10-CM

## 2013-04-17 MED ORDER — METFORMIN HCL ER 500 MG PO TB24: 500.0000 mg | ORAL_TABLET | Freq: Two times a day (BID) | ORAL | Status: DC

## 2013-04-17 NOTE — Patient Instructions (Addendum)

## 2013-04-17 NOTE — Telephone Encounter (Signed)
Thank you for clarification - there was no intended change the dose of metformin If she has been taking 500 mg twice a day, she should continue same I have updated her medication list to reflects this and sent new prescription to [facility]

## 2013-04-17 NOTE — Progress Notes (Signed)
72 y.o. G1P1 SingleCaucasianF here for annual exam.  No vaginal bleeding.  Doing really well.  Blood work every six months.  HbA1C was 6.4 in March.  Had u/s last year due to PMP bleeding.  None since.  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  Already have four enrolled.  Would like to have five total.     Patient's last menstrual period was 10/09/2012.          Sexually active: no  The current method of family planning is post menopausal status.    Exercising: yes  housework and garden Smoker:  no  Health Maintenance: Pap:  12/11/11 WNL/negative HR HPV MMG:  09/07/12 3D normal Colonoscopy:  2013 repeat 5 years BMD:   11/04/11 stable (worse t score -1.3) TDaP:  2011 Labs: PCP   reports that she has never smoked. She has never used smokeless tobacco. She reports that she does not drink alcohol or use illicit drugs.  Past Medical History  Diagnosis Date  . Obesity, unspecified   . OSTEOPENIA   . URINARY INCONTINENCE   . DEPRESSION   . DIABETES MELLITUS, TYPE II   . HYPERLIPIDEMIA   . Diverticulosis   . Asthma   . GERD (gastroesophageal reflux disease) 10/2011    severe  . Allergy     seasonal  . Menometrorrhagia   . Macular degeneration of left eye     mild, Dr.Hecker  . ASCUS on Pap smear     neg HPV    Past Surgical History  Procedure Laterality Date  . Cesarean section  01/1973  . Right ankle  1992  . Left wrist surgery  in 2008    By Dr. Yisroel Ramming  . Endomrtrial biopsy      Current Outpatient Prescriptions  Medication Sig Dispense Refill  . albuterol (PROVENTIL HFA;VENTOLIN HFA) 108 (90 BASE) MCG/ACT inhaler Inhale 2 puffs into the lungs every 4 (four) hours as needed for wheezing or shortness of breath (rescue inhaler).  1 Inhaler  prn  . Calcium Carbonate-Vitamin D (CALCIUM 600+D HIGH POTENCY) 600-400 MG-UNIT per tablet Take 1 tablet by mouth 2 (two) times daily.       . cetirizine (ZYRTEC) 10 MG tablet Take  0.5 tablets (5 mg total) by mouth daily.  30 tablet  11  . FREESTYLE LITE test strip CHECK BLOOD SUGAR TWICE DAILY AS DIRECTED  180 each  1  . ibuprofen (ADVIL,MOTRIN) 200 MG tablet Take 200 mg by mouth as needed.        . Lancets (FREESTYLE) lancets 1 each by Other route 2 (two) times daily. Use as instructed       . metFORMIN (GLUCOPHAGE-XR) 500 MG 24 hr tablet Take 0.5 tablets (250 mg total) by mouth 2 (two) times daily.  60 tablet  5  . METROGEL 1 % gel Apply 1 application topically daily.       . mometasone-formoterol (DULERA) 200-5 MCG/ACT AERO Inhale 2 puffs into the lungs 2 (two) times daily.  13 g  2  . montelukast (SINGULAIR) 10 MG tablet Take 1 tablet (10 mg total) by mouth daily.  30 tablet  3  . Multiple Vitamin (MULTIVITAMIN) tablet Take 1 tablet by mouth daily.        . Multiple Vitamins-Minerals (ICAPS) CAPS Take 1 capsule by mouth daily.        . Probiotic Product (PROBIOTIC DAILY PO) Take by mouth daily.      Marland Kitchen  sertraline (ZOLOFT) 50 MG tablet Take 1 tablet (50 mg total) by mouth daily.  90 tablet  1  . simvastatin (ZOCOR) 20 MG tablet TAKE ONE TABLET BY MOUTH DAILY  30 tablet  5  . HYDROcodone-homatropine (HYCODAN) 5-1.5 MG/5ML syrup Take 5 mLs by mouth at bedtime as needed for cough.  120 mL  0   No current facility-administered medications for this visit.    Family History  Problem Relation Age of Onset  . Diabetes Father   . Hyperlipidemia Father   . Heart disease Father   . Multiple sclerosis Daughter   . Cancer      bladder  . Colon cancer Paternal Grandmother   . Osteoporosis Mother     ROS:  Pertinent items are noted in HPI.  Otherwise, a comprehensive ROS was negative.  Exam:   BP 120/78  Ht 4' 11.75" (1.518 m)  Wt 192 lb 9.6 oz (87.363 kg)  BMI 37.91 kg/m2  LMP 10/09/2012  Wt change +4 lbs   Height: 4' 11.75" (151.8 cm)  Ht Readings from Last 3 Encounters:  04/17/13 4' 11.75" (1.518 m)  10/28/12 5' (1.524 m)  10/14/12 5' (1.524 m)    General  appearance: alert, cooperative and appears stated age Head: Normocephalic, without obvious abnormality, atraumatic Neck: no adenopathy, supple, symmetrical, trachea midline and thyroid normal to inspection and palpation Lungs: clear to auscultation bilaterally Breasts: normal appearance, no masses or tenderness, cannot palpate cystic area RLQ felt one year ago Heart: regular rate and rhythm Abdomen: soft, non-tender; bowel sounds normal; no masses,  no organomegaly Extremities: extremities normal, atraumatic, no cyanosis or edema Skin: Skin color, texture, turgor normal. No rashes or lesions Lymph nodes: Cervical, supraclavicular, and axillary nodes normal. No abnormal inguinal nodes palpated Neurologic: Grossly normal   Pelvic: External genitalia:  no lesions              Urethra:  normal appearing urethra with no masses, tenderness or lesions              Bartholins and Skenes: normal                 Vagina: normal appearing vagina with normal color and discharge, no lesions              Cervix: no lesions              Pap taken: no Bimanual Exam:  Uterus:  normal size, contour, position, consistency, mobility, non-tender              Adnexa: no mass, fullness, tenderness               Rectovaginal: Confirms               Anus:  normal sphincter tone, no lesions  A:  Well Woman with normal exam, PMP no HRT Diabetes Hypertension Elevated Lipids Asthma Osteopenia  P:   Mammogram yearly. Pap smear neg with neg HR HPV 12/12.  No Pap today. Pt knows to call if has any other vaginal bleeding. Labs with PCP every six months. Off Fosamax.  Plan BMD next year. Return annually or prn  An After Visit Summary was printed and given to the patient.

## 2013-04-17 NOTE — Telephone Encounter (Signed)
Pt calling regarding Metformin signature. According to rx pt is to take 1/2 tablet by mouth BID but pt has been taking 1 tablet BID for over a year. Pt is requesting MD's advisement on dosage.

## 2013-04-17 NOTE — Telephone Encounter (Signed)
Pt informed of MD's advisement-rx sent for 1 tablet bid.

## 2013-05-16 ENCOUNTER — Ambulatory Visit (INDEPENDENT_AMBULATORY_CARE_PROVIDER_SITE_OTHER): Payer: 59 | Admitting: *Deleted

## 2013-05-16 DIAGNOSIS — Z111 Encounter for screening for respiratory tuberculosis: Secondary | ICD-10-CM

## 2013-05-18 ENCOUNTER — Other Ambulatory Visit: Payer: Self-pay | Admitting: *Deleted

## 2013-05-18 LAB — TB SKIN TEST: Induration: 0 mm

## 2013-07-04 ENCOUNTER — Other Ambulatory Visit: Payer: Self-pay | Admitting: Internal Medicine

## 2013-08-15 ENCOUNTER — Other Ambulatory Visit: Payer: Self-pay

## 2013-08-15 DIAGNOSIS — Z1231 Encounter for screening mammogram for malignant neoplasm of breast: Secondary | ICD-10-CM

## 2013-08-28 ENCOUNTER — Other Ambulatory Visit: Payer: Self-pay | Admitting: *Deleted

## 2013-08-28 MED ORDER — MONTELUKAST SODIUM 10 MG PO TABS: 10.0000 mg | ORAL_TABLET | Freq: Every day | ORAL | Status: DC

## 2013-09-06 ENCOUNTER — Ambulatory Visit: Payer: Medicare Other | Admitting: Internal Medicine

## 2013-09-18 ENCOUNTER — Ambulatory Visit
Admission: RE | Admit: 2013-09-18 | Discharge: 2013-09-18 | Disposition: A | Discharge status: Home / Self Care | Payer: Medicare Other | Source: Ambulatory Visit

## 2013-09-18 DIAGNOSIS — Z1231 Encounter for screening mammogram for malignant neoplasm of breast: Secondary | ICD-10-CM

## 2013-09-20 ENCOUNTER — Other Ambulatory Visit: Payer: Self-pay | Admitting: Obstetrics & Gynecology

## 2013-09-20 DIAGNOSIS — R928 Other abnormal and inconclusive findings on diagnostic imaging of breast: Secondary | ICD-10-CM

## 2013-10-16 ENCOUNTER — Encounter: Payer: Self-pay | Admitting: Internal Medicine

## 2013-10-19 ENCOUNTER — Other Ambulatory Visit: Payer: Self-pay

## 2013-10-24 ENCOUNTER — Encounter: Payer: Self-pay | Admitting: Internal Medicine

## 2013-10-24 ENCOUNTER — Other Ambulatory Visit (INDEPENDENT_AMBULATORY_CARE_PROVIDER_SITE_OTHER): Payer: Medicare Other

## 2013-10-24 ENCOUNTER — Ambulatory Visit
Admission: RE | Admit: 2013-10-24 | Discharge: 2013-10-24 | Disposition: A | Discharge status: Home / Self Care | Payer: Medicare Other | Source: Ambulatory Visit | Attending: Obstetrics & Gynecology | Admitting: Obstetrics & Gynecology

## 2013-10-24 ENCOUNTER — Ambulatory Visit (INDEPENDENT_AMBULATORY_CARE_PROVIDER_SITE_OTHER): Payer: Medicare Other | Admitting: Internal Medicine

## 2013-10-24 VITALS — BP 132/82 | HR 83 | Temp 97.6°F | Wt 191.0 lb

## 2013-10-24 DIAGNOSIS — Z0000 Encounter for general adult medical examination without abnormal findings: Secondary | ICD-10-CM

## 2013-10-24 DIAGNOSIS — E119 Type 2 diabetes mellitus without complications: Secondary | ICD-10-CM

## 2013-10-24 DIAGNOSIS — E669 Obesity, unspecified: Secondary | ICD-10-CM

## 2013-10-24 DIAGNOSIS — E785 Hyperlipidemia, unspecified: Secondary | ICD-10-CM

## 2013-10-24 DIAGNOSIS — R928 Other abnormal and inconclusive findings on diagnostic imaging of breast: Secondary | ICD-10-CM

## 2013-10-24 DIAGNOSIS — J45909 Unspecified asthma, uncomplicated: Secondary | ICD-10-CM

## 2013-10-24 LAB — BASIC METABOLIC PANEL
CO2: 25 mEq/L (ref 19–32)
Calcium: 9.2 mg/dL (ref 8.4–10.5)
Chloride: 107 mEq/L (ref 96–112)
Creatinine, Ser: 0.7 mg/dL (ref 0.4–1.2)
Potassium: 3.7 mEq/L (ref 3.5–5.1)
Sodium: 139 mEq/L (ref 135–145)

## 2013-10-24 LAB — LIPID PANEL
Cholesterol: 155 mg/dL (ref 0–200)
HDL: 45.6 mg/dL (ref >39.00)
Total CHOL/HDL Ratio: 3
Triglycerides: 204 mg/dL — ABNORMAL HIGH (ref 0.0–149.0)
VLDL: 40.8 mg/dL — ABNORMAL HIGH (ref 0.0–40.0)

## 2013-10-24 LAB — LDL CHOLESTEROL, DIRECT: Direct LDL: 89.6 mg/dL

## 2013-10-24 NOTE — Progress Notes (Signed)
Subjective:    Patient ID: Faith Orr, female    DOB: 1941/03/11, 72 y.o.   MRN: [id number]  HPI  Here for medicare wellness  Diet: heart healthy, diabetic Physical activity: sedentary Depression/mood screen: negative Hearing: intact to whispered voice Visual acuity: grossly normal, performs annual eye exam  ADLs: capable Fall risk: none Home safety: good Cognitive evaluation: intact to orientation, naming, recall and repetition EOL planning: adv directives, full code/ I agree  I have personally reviewed and have noted 1. The patient's medical and social history 2. Their use of alcohol, tobacco or illicit drugs 3. Their current medications and supplements 4. The patient's functional ability including ADL's, fall risks, home safety risks and hearing or visual impairment. 5. Diet and physical activities 6. Evidence for depression or mood disorders  Also reviewed chronic medical issues:  DM2 - has been checking sugars q AM - range 80s-120s fasting watches diet but always heavy - "life member weight watchers" reports compliance with ongoing medical treatment and no changes in medication dose or frequency. denies adverse side effects related to current therapy.  dyslipidemia - on statin for same - reports compliance with ongoing medical treatment and no changes in medication dose or frequency. denies adverse side effects related to current therapy.   depression - reports compliance with ongoing medical treatment and no changes in medication dose or frequency. denies adverse side effects related to current therapy. no si/hi  osteopenia -last dexa 10/2011 -reports compliance with ongoing medical treatment and changed back to actonel from [place] due to cost. denies adverse side effects related to current therapy.    Past Medical History  Diagnosis Date  . Obesity, unspecified   . OSTEOPENIA   . URINARY INCONTINENCE   . DEPRESSION   . DIABETES MELLITUS, TYPE II   .  HYPERLIPIDEMIA   . Diverticulosis   . Asthma   . Allergy     seasonal  . Macular degeneration of left eye     mild, Dr.Hecker  . Osteopenia     Review of Systems  Constitutional: Positive for fatigue. Negative for activity change and unexpected weight change.  Eyes: Negative for visual disturbance.  Respiratory: Negative for shortness of breath and wheezing.   Cardiovascular: Negative for chest pain.       Objective:   Physical Exam BP 142/82  Pulse 83  Temp(Src) 97.6 F (36.4 C) (Oral)  Wt 191 lb (86.637 kg)  SpO2 95%  LMP 10/09/2012 Wt Readings from Last 3 Encounters:  10/24/13 191 lb (86.637 kg)  04/17/13 192 lb 9.6 oz (87.363 kg)  03/28/13 197 lb 12.8 oz (89.721 kg)   Constitutional: She is overweight, but appears well-developed and well-nourished. No distress.   Neck: Normal range of motion. Neck supple. No JVD present. No thyromegaly present.  Cardiovascular: Normal rate, regular rhythm and normal heart sounds.  No murmur heard. No BLE edema. Pulmonary/Chest: Effort normal and breath sounds normal. No respiratory distress. She has no wheezes. Psychiatric: She has a normal mood and affect. Her behavior is normal. Judgment and thought content normal.       Lab Results  Component Value Date   WBC 8.6 09/05/2012   HGB 14.5 09/05/2012   HCT 42.7 09/05/2012   PLT 268.0 09/05/2012   CHOL 186 09/05/2012   TRIG 300.0* 09/05/2012   HDL 48.70 09/05/2012   LDLDIRECT 99.9 09/05/2012   ALT 30 09/05/2012   AST 34 09/05/2012   NA 141 09/05/2012   K 4.4 09/05/2012  CL 101 09/05/2012   CREATININE 0.8 09/05/2012   BUN 22 09/05/2012   CO2 26 09/05/2012   TSH 1.18 09/05/2012   HGBA1C 6.4 03/06/2013   MICROALBUR 0.5 03/06/2013    Assessment & Plan:   AWV/v70.0 0- Today patient counseled on age appropriate routine health concerns for screening and prevention, each reviewed and up to date or declined. Immunizations reviewed and up to date or declined. Labs reviewed. Risk factors for  depression reviewed and negative. Hearing function and visual acuity are intact. ADLs screened and addressed as needed. Functional ability and level of safety reviewed and appropriate. Education, counseling and referrals performed based on assessed risks today. Patient provided with a copy of personalized plan for preventive services.  Fatigue - nonspecific symptoms/exam - check screening labs  Also See problem list. Medications and labs reviewed today.

## 2013-10-24 NOTE — Assessment & Plan Note (Signed)
On metformin -  The current medical regimen is effective;  continue present plan and medications. Check a1c now Lab Results  Component Value Date   HGBA1C 6.4 03/06/2013

## 2013-10-24 NOTE — Assessment & Plan Note (Signed)
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

## 2013-10-24 NOTE — Assessment & Plan Note (Signed)
Wt Readings from Last 3 Encounters:  10/24/13 191 lb (86.637 kg)  04/17/13 192 lb 9.6 oz (87.363 kg)  03/28/13 197 lb 12.8 oz (89.721 kg)   Reviewed importance of exercise and diet to control same, especially with DM and other comorbid dz

## 2013-10-24 NOTE — Assessment & Plan Note (Signed)
On statin - reviewed lipids annually - The current medical regimen is effective;  continue present plan and medications.  

## 2013-10-24 NOTE — Patient Instructions (Signed)
It was good to see you today.  We have reviewed your prior records including labs and tests today  Health Maintenance reviewed - all recommended immunizations and age-appropriate screenings are up-to-date.  Test(s) ordered today. Your results will be released to MyChart (or called to you) after review, usually within 72hours after test completion. If any changes need to be made, you will be notified at that same time.  Medications reviewed, no changes at this time.   Please schedule followup in 6 months for diabetes mellitus check and weight check, call sooner if problems.  Health Maintenance, Females A healthy lifestyle and preventative care can promote health and wellness.  Maintain regular health, dental, and eye exams.   Eat a healthy diet. Foods like vegetables, fruits, whole grains, low-fat dairy products, and lean protein foods contain the nutrients you need without too many calories. Decrease your intake of foods high in solid fats, added sugars, and salt. Get information about a proper diet from your caregiver, if necessary.   Regular physical exercise is one of the most important things you can do for your health. Most adults should get at least 150 minutes of moderate-intensity exercise (any activity that increases your heart rate and causes you to sweat) each week. In addition, most adults need muscle-strengthening exercises on 2 or more days a week.     Maintain a healthy weight. The body mass index (BMI) is a screening tool to identify possible weight problems. It provides an estimate of body fat based on height and weight. Your caregiver can help determine your BMI, and can help you achieve or maintain a healthy weight. For adults 20 years and older:   A BMI below 18.5 is considered underweight.   A BMI of 18.5 to 24.9 is normal.   A BMI of 25 to 29.9 is considered overweight.   A BMI of 30 and above is considered obese.   Maintain normal blood lipids and cholesterol by  exercising and minimizing your intake of saturated fat. Eat a balanced diet with plenty of fruits and vegetables. Blood tests for lipids and cholesterol should begin at age 76 and be repeated every 5 years. If your lipid or cholesterol levels are high, you are over 50, or you are a high risk for heart disease, you may need your cholesterol levels checked more frequently. Ongoing high lipid and cholesterol levels should be treated with medicines if diet and exercise are not effective.   If you smoke, find out from your caregiver how to quit. If you do not use tobacco, do not start.   If you are pregnant, do not drink alcohol. If you are breastfeeding, be very cautious about drinking alcohol. If you are not pregnant and choose to drink alcohol, do not exceed 1 drink per day. One drink is considered to be 12 ounces (355 mL) of beer, 5 ounces (148 mL) of wine, or 1.5 ounces (44 mL) of liquor.   Avoid use of street drugs. Do not share needles with anyone. Ask for help if you need support or instructions about stopping the use of drugs.   High blood pressure causes heart disease and increases the risk of stroke. Blood pressure should be checked at least every 1 to 2 years. Ongoing high blood pressure should be treated with medicines, if weight loss and exercise are not effective.   If you are 72 to 70 years old, ask your caregiver if you should take aspirin to prevent strokes.   Diabetes screening  involves taking a blood sample to check your fasting blood sugar level. This should be done once every 3 years, after age 75, if you are within normal weight and without risk factors for diabetes. Testing should be considered at a younger age or be carried out more frequently if you are overweight and have at least 1 risk factor for diabetes.   Breast cancer screening is essential preventative care for women. You should practice "breast self-awareness." This means understanding the normal appearance and feel of  your breasts and may include breast self-examination. Any changes detected, no matter how small, should be reported to a caregiver. Women in their 36s and 30s should have a clinical breast exam (CBE) by a caregiver as part of a regular health exam every 1 to 3 years. After age 56, women should have a CBE every year. Starting at age 22, women should consider having a mammogram (breast X-ray) every year. Women who have a family history of breast cancer should talk to their caregiver about genetic screening. Women at a high risk of breast cancer should talk to their caregiver about having an MRI and a mammogram every year.   The Pap test is a screening test for cervical cancer. Women should have a Pap test starting at age 77. Between ages 50 and 34, Pap tests should be repeated every 2 years. Beginning at age 53, you should have a Pap test every 3 years as long as the past 3 Pap tests have been normal. If you had a hysterectomy for a problem that was not cancer or a condition that could lead to cancer, then you no longer need Pap tests. If you are between ages 94 and 65, and you have had normal Pap tests going back 10 years, you no longer need Pap tests. If you have had past treatment for cervical cancer or a condition that could lead to cancer, you need Pap tests and screening for cancer for at least 20 years after your treatment. If Pap tests have been discontinued, risk factors (such as a new sexual partner) need to be reassessed to determine if screening should be resumed. Some women have medical problems that increase the chance of getting cervical cancer. In these cases, your caregiver may recommend more frequent screening and Pap tests.   The human papillomavirus (HPV) test is an additional test that may be used for cervical cancer screening. The HPV test looks for the virus that can cause the cell changes on the cervix. The cells collected during the Pap test can be tested for HPV. The HPV test could be  used to screen women aged 63 years and older, and should be used in women of any age who have unclear Pap test results. After the age of 21, women should have HPV testing at the same frequency as a Pap test.   Colorectal cancer can be detected and often prevented. Most routine colorectal cancer screening begins at the age of 25 and continues through age 29. However, your caregiver may recommend screening at an earlier age if you have risk factors for colon cancer. On a yearly basis, your caregiver may provide home test kits to check for hidden blood in the stool. Use of a small camera at the end of a tube, to directly examine the colon (sigmoidoscopy or colonoscopy), can detect the earliest forms of colorectal cancer. Talk to your caregiver about this at age 36, when routine screening begins. Direct examination of the colon should be repeated  every 5 to 10 years through age 25, unless early forms of pre-cancerous polyps or small growths are found.   Hepatitis C blood testing is recommended for all people born from [birth year] through 1965 and any individual with known risks for hepatitis C.   Practice safe sex. Use condoms and avoid high-risk sexual practices to reduce the spread of sexually transmitted infections (STIs). Sexually active women aged 76 and younger should be checked for Chlamydia, which is a common sexually transmitted infection. Older women with new or multiple partners should also be tested for Chlamydia. Testing for other STIs is recommended if you are sexually active and at increased risk.   Osteoporosis is a disease in which the bones lose minerals and strength with aging. This can result in serious bone fractures. The risk of osteoporosis can be identified using a bone density scan. Women ages 72 and over and women at risk for fractures or osteoporosis should discuss screening with their caregivers. Ask your caregiver whether you should be taking a calcium supplement or vitamin D to reduce the  rate of osteoporosis.   Menopause can be associated with physical symptoms and risks. Hormone replacement therapy is available to decrease symptoms and risks. You should talk to your caregiver about whether hormone replacement therapy is right for you.   Use sunscreen with a sun protection factor (SPF) of 30 or greater. Apply sunscreen liberally and repeatedly throughout the day. You should seek shade when your shadow is shorter than you. Protect yourself by wearing long sleeves, pants, a wide-brimmed hat, and sunglasses year round, whenever you are outdoors.   Notify your caregiver of new moles or changes in moles, especially if there is a change in shape or color. Also notify your caregiver if a mole is larger than the size of a pencil eraser.   Stay current with your immunizations.  Document Released: 06/15/2011 Document Revised: 11/19/2011 Document Reviewed: 06/15/2011 [facility] Patient Information 2012 [place], [place].

## 2013-10-24 NOTE — Progress Notes (Signed)
Pre-visit discussion using our clinic review tool. No additional management support is needed unless otherwise documented below in the visit note.  

## 2013-10-25 ENCOUNTER — Encounter: Payer: Self-pay | Admitting: Internal Medicine

## 2013-11-19 ENCOUNTER — Other Ambulatory Visit: Payer: Self-pay | Admitting: Internal Medicine

## 2013-11-20 ENCOUNTER — Other Ambulatory Visit: Payer: Self-pay | Admitting: *Deleted

## 2013-11-20 MED ORDER — SIMVASTATIN 20 MG PO TABS: ORAL_TABLET | ORAL | Status: DC

## 2013-11-20 NOTE — Telephone Encounter (Signed)
Refill done.  

## 2014-01-28 ENCOUNTER — Other Ambulatory Visit: Payer: Self-pay | Admitting: Internal Medicine

## 2014-02-25 ENCOUNTER — Other Ambulatory Visit: Payer: Self-pay | Admitting: Internal Medicine

## 2014-03-22 LAB — HM DIABETES EYE EXAM

## 2014-03-26 ENCOUNTER — Encounter: Payer: Self-pay | Admitting: Internal Medicine

## 2014-05-31 ENCOUNTER — Ambulatory Visit: Payer: Medicare Other | Admitting: Internal Medicine

## 2014-06-01 ENCOUNTER — Other Ambulatory Visit (INDEPENDENT_AMBULATORY_CARE_PROVIDER_SITE_OTHER): Payer: Medicare Other

## 2014-06-01 ENCOUNTER — Ambulatory Visit (INDEPENDENT_AMBULATORY_CARE_PROVIDER_SITE_OTHER): Payer: Medicare Other | Admitting: Internal Medicine

## 2014-06-01 ENCOUNTER — Encounter: Payer: Self-pay | Admitting: Internal Medicine

## 2014-06-01 VITALS — BP 118/72 | HR 75 | Temp 98.2°F | Ht 59.75 in | Wt 183.8 lb

## 2014-06-01 DIAGNOSIS — R0989 Other specified symptoms and signs involving the circulatory and respiratory systems: Secondary | ICD-10-CM

## 2014-06-01 DIAGNOSIS — E119 Type 2 diabetes mellitus without complications: Secondary | ICD-10-CM

## 2014-06-01 DIAGNOSIS — R0609 Other forms of dyspnea: Secondary | ICD-10-CM

## 2014-06-01 DIAGNOSIS — R0683 Snoring: Secondary | ICD-10-CM | POA: Insufficient documentation

## 2014-06-01 DIAGNOSIS — R5383 Other fatigue: Secondary | ICD-10-CM

## 2014-06-01 DIAGNOSIS — E785 Hyperlipidemia, unspecified: Secondary | ICD-10-CM

## 2014-06-01 DIAGNOSIS — R5381 Other malaise: Secondary | ICD-10-CM

## 2014-06-01 LAB — CBC WITH DIFFERENTIAL/PLATELET
Basophils Absolute: 0.1 10*3/uL (ref 0.0–0.1)
Basophils Relative: 0.8 % (ref 0.0–3.0)
Eosinophils Absolute: 0.2 10*3/uL (ref 0.0–0.7)
Eosinophils Relative: 2.9 % (ref 0.0–5.0)
HCT: 42.3 % (ref 36.0–46.0)
Hemoglobin: 14.4 g/dL (ref 12.0–15.0)
Lymphocytes Relative: 29.9 % (ref 12.0–46.0)
Lymphs Abs: 2 10*3/uL (ref 0.7–4.0)
MCHC: 34 g/dL (ref 30.0–36.0)
MCV: 89.4 fl (ref 78.0–100.0)
Monocytes Absolute: 0.7 10*3/uL (ref 0.1–1.0)
Monocytes Relative: 10.3 % (ref 3.0–12.0)
Neutro Abs: 3.7 10*3/uL (ref 1.4–7.7)
Neutrophils Relative %: 56.1 % (ref 43.0–77.0)
Platelets: 285 10*3/uL (ref 150.0–400.0)
RBC: 4.73 Mil/uL (ref 3.87–5.11)
RDW: 12 % (ref 11.5–15.5)
WBC: 6.6 10*3/uL (ref 4.0–10.5)

## 2014-06-01 LAB — HEPATIC FUNCTION PANEL
ALBUMIN: 3.9 g/dL (ref 3.5–5.2)
ALT: 30 U/L (ref 0–35)
AST: 38 U/L — ABNORMAL HIGH (ref 0–37)
Alkaline Phosphatase: 80 U/L (ref 39–117)
Bilirubin, Direct: 0.1 mg/dL (ref 0.0–0.3)
Total Bilirubin: 0.8 mg/dL (ref 0.2–1.2)
Total Protein: 7.2 g/dL (ref 6.0–8.3)

## 2014-06-01 LAB — MICROALBUMIN / CREATININE URINE RATIO
CREATININE, U: 84.2 mg/dL
MICROALB UR: 0.3 mg/dL (ref 0.0–1.9)
Microalb Creat Ratio: 0.4 mg/g (ref 0.0–30.0)

## 2014-06-01 LAB — TSH: TSH: 1.06 u[IU]/mL (ref 0.35–4.50)

## 2014-06-01 LAB — HEMOGLOBIN A1C: Hgb A1c MFr Bld: 6.3 % (ref 4.6–6.5)

## 2014-06-01 MED ORDER — FREESTYLE FREEDOM LITE W/DEVICE KIT: PACK | Status: AC

## 2014-06-01 MED ORDER — SIMVASTATIN 20 MG PO TABS: ORAL_TABLET | ORAL | Status: DC

## 2014-06-01 MED ORDER — GLUCOSE BLOOD VI STRP: ORAL_STRIP | Status: DC

## 2014-06-01 MED ORDER — SERTRALINE HCL 50 MG PO TABS: ORAL_TABLET | ORAL | Status: DC

## 2014-06-01 MED ORDER — METFORMIN HCL ER 500 MG PO TB24: ORAL_TABLET | ORAL | Status: DC

## 2014-06-01 NOTE — Assessment & Plan Note (Signed)
Pt unaware of same but spouse reports increasing problems associated with increase daytime somnolence and irritability -  also increase in BP readings Refer to sleep specialist for further eval of same - reviewed concern for potential sleep apnea

## 2014-06-01 NOTE — Progress Notes (Signed)
Pre visit review using our clinic review tool, if applicable. No additional management support is needed unless otherwise documented below in the visit note. 

## 2014-06-01 NOTE — Assessment & Plan Note (Signed)
On statin - reviewed lipids annually - The current medical regimen is effective;  continue present plan and medications.  

## 2014-06-01 NOTE — Patient Instructions (Signed)
It was good to see you today.  We have reviewed your prior records including labs and tests today  Test(s) ordered today. Your results will be released to [place] (or called to you) after review, usually within 72hours after test completion. If any changes need to be made, you will be notified at that same time.  Medications reviewed and updated, no changes recommended at this time. Refill on medication(s) as discussed today.  we'll make referral to sleep specialist . Our office will contact you regarding appointment(s) once made.  Please schedule followup in 3-4 months, call sooner if problems.

## 2014-06-01 NOTE — Progress Notes (Signed)
Subjective:    Patient ID: Faith Orr, female    DOB: 03/18/1941, 73 y.o.   MRN: [id number]  HPI  Patient is here for follow up  Reviewed chronic medical issues and interval medical events  Past Medical History  Diagnosis Date  . Obesity, unspecified   . OSTEOPENIA   . URINARY INCONTINENCE   . DEPRESSION   . DIABETES MELLITUS, TYPE II   . HYPERLIPIDEMIA   . Diverticulosis   . Asthma   . Allergy     seasonal  . Macular degeneration of left eye     mild, Dr.Hecker  . Osteopenia     Review of Systems  Constitutional: Positive for fatigue. Negative for fever and unexpected weight change.  Respiratory: Negative for cough and shortness of breath.        Snoring  Cardiovascular: Negative for chest pain, palpitations and leg swelling.  Neurological: Negative for weakness.  Psychiatric/Behavioral: Negative for sleep disturbance (but spouse reports inc snoring) and dysphoric mood. The patient is not nervous/anxious.        Objective:   Physical Exam  BP 118/72  Pulse 75  Temp(Src) 98.2 F (36.8 C) (Oral)  Ht 4' 11.75" (1.518 m)  Wt 183 lb 12.8 oz (83.371 kg)  BMI 36.18 kg/m2  SpO2 95%  LMP 10/09/2012 Wt Readings from Last 3 Encounters:  06/01/14 183 lb 12.8 oz (83.371 kg)  10/24/13 191 lb (86.637 kg)  04/17/13 192 lb 9.6 oz (87.363 kg)   Constitutional: She is obese, but appears well-developed and well-nourished. No distress.  Neck: Normal range of motion. Neck supple. No JVD present. No thyromegaly present.  Cardiovascular: Normal rate, regular rhythm and normal heart sounds.  No murmur heard. No BLE edema. Pulmonary/Chest: Effort normal and breath sounds normal. No respiratory distress. She has no wheezes.  Psychiatric: She has a normal mood and affect. Her behavior is normal. Judgment and thought content normal.   Lab Results  Component Value Date   WBC 8.6 09/05/2012   HGB 14.5 09/05/2012   HCT 42.7 09/05/2012   PLT 268.0 09/05/2012   GLUCOSE 166* 10/24/2013    CHOL 155 10/24/2013   TRIG 204.0* 10/24/2013   HDL 45.60 10/24/2013   LDLDIRECT 89.6 10/24/2013   LDLCALC 107* 05/28/2010   ALT 30 09/05/2012   AST 34 09/05/2012   NA 139 10/24/2013   K 3.7 10/24/2013   CL 107 10/24/2013   CREATININE 0.7 10/24/2013   BUN 12 10/24/2013   CO2 25 10/24/2013   TSH 1.18 09/05/2012   HGBA1C 6.7* 10/24/2013   MICROALBUR 0.5 03/06/2013    US Breast Right  10/24/2013   CLINICAL DATA:  Abnormal screening mammogram.  EXAM: DIGITAL DIAGNOSTIC  RIGHT MAMMOGRAM WITH CAD  ULTRASOUND RIGHT BREAST  COMPARISON:  Multiple priors.  ACR Breast Density Category b: There are scattered areas of fibroglandular density.  FINDINGS: A circumscribed oval 1.4 cm mass is identified in the medial inframammary fold of the right breast.  Mammographic images were processed with CAD.  On physical exam,there is a mobile mass palpated at 4 o'clock, 8 cm from the nipple.  Targeted ultrasound demonstrates a superficial mixed anechoic and hypoechoic mass at 4 o'clock, 8 cm from the nipple. This measures 1.2 x 0.9 x 1.1 cm. A hypoechoic tail extends from this mass to the skin surface.  IMPRESSION: Sebaceous cyst in the inframammary fold of the right breast.  RECOMMENDATION: Screening mammogram in one year.(Code:SM-B-01Y)  I have discussed the findings and recommendations  with the patient. Results were also provided in writing at the conclusion of the visit.  BI-RADS CATEGORY  2: Benign Finding(s)   Electronically Signed   By: Donavan Burnet M.D.   On: 10/24/2013 10:52   Mm Frio R  10/24/2013   CLINICAL DATA:  Abnormal screening mammogram.  EXAM: DIGITAL DIAGNOSTIC  RIGHT MAMMOGRAM WITH CAD  ULTRASOUND RIGHT BREAST  COMPARISON:  Multiple priors.  ACR Breast Density Category b: There are scattered areas of fibroglandular density.  FINDINGS: A circumscribed oval 1.4 cm mass is identified in the medial inframammary fold of the right breast.  Mammographic images were processed with CAD.  On  physical exam,there is a mobile mass palpated at 4 o'clock, 8 cm from the nipple.  Targeted ultrasound demonstrates a superficial mixed anechoic and hypoechoic mass at 4 o'clock, 8 cm from the nipple. This measures 1.2 x 0.9 x 1.1 cm. A hypoechoic tail extends from this mass to the skin surface.  IMPRESSION: Sebaceous cyst in the inframammary fold of the right breast.  RECOMMENDATION: Screening mammogram in one year.(Code:SM-B-01Y)  I have discussed the findings and recommendations with the patient. Results were also provided in writing at the conclusion of the visit.  BI-RADS CATEGORY  2: Benign Finding(s)   Electronically Signed   By: Donavan Burnet M.D.   On: 10/24/2013 10:52       Assessment & Plan:   Fatigue - suspect multifactorial, but nonspecific symptoms/exam - check screening labs  Problem List Items Addressed This Visit   DIABETES MELLITUS, TYPE II - Primary      On metformin -  The current medical regimen is effective;  continue present plan and medications. Check a1c now Lab Results  Component Value Date   HGBA1C 6.7* 10/24/2013      Relevant Medications      metFORMIN (GLUCOPHAGE-XR) 24 hr tablet      simvastatin (ZOCOR) tablet   Other Relevant Orders      Hemoglobin A1c      Microalbumin / creatinine urine ratio   HYPERLIPIDEMIA     On statin - reviewed lipids annually - The current medical regimen is effective;  continue present plan and medications.     Relevant Medications      simvastatin (ZOCOR) tablet   Snoring     Pt unaware of same but spouse reports increasing problems associated with increase daytime somnolence and irritability -  also increase in BP readings Refer to sleep specialist for further eval of same - reviewed concern for potential sleep apnea    Relevant Orders      Ambulatory referral to Pulmonology    Other Visit Diagnoses   Other fatigue        Relevant Orders       Ambulatory referral to Pulmonology       CBC with Differential         Hepatic function panel       TSH

## 2014-06-01 NOTE — Assessment & Plan Note (Signed)
On metformin -  The current medical regimen is effective;  continue present plan and medications. Check a1c now Lab Results  Component Value Date   HGBA1C 6.7* 10/24/2013

## 2014-07-13 ENCOUNTER — Other Ambulatory Visit: Payer: Self-pay | Admitting: *Deleted

## 2014-07-13 MED ORDER — GLUCOSE BLOOD VI STRP: ORAL_STRIP | Status: AC

## 2014-07-13 NOTE — Telephone Encounter (Signed)
Left msg on triage stating md gave her new BS monitor for FreeStyle Light. Needing strips sent to [facility]. Called pt no LMOM will send to pharmacy...Johny Chess

## 2014-07-20 ENCOUNTER — Institutional Professional Consult (permissible substitution): Payer: Medicare Other | Admitting: Pulmonary Disease

## 2014-07-31 ENCOUNTER — Ambulatory Visit: Payer: 59 | Admitting: Obstetrics & Gynecology

## 2014-08-07 ENCOUNTER — Ambulatory Visit: Payer: 59 | Admitting: Obstetrics & Gynecology

## 2014-08-17 ENCOUNTER — Other Ambulatory Visit: Payer: Self-pay | Admitting: Internal Medicine

## 2014-09-27 ENCOUNTER — Other Ambulatory Visit: Payer: Self-pay

## 2014-09-27 DIAGNOSIS — Z1231 Encounter for screening mammogram for malignant neoplasm of breast: Secondary | ICD-10-CM

## 2014-10-15 ENCOUNTER — Encounter: Payer: Self-pay | Admitting: Internal Medicine

## 2014-10-26 ENCOUNTER — Ambulatory Visit
Admission: RE | Admit: 2014-10-26 | Discharge: 2014-10-26 | Disposition: A | Discharge status: Home / Self Care | Payer: 59 | Source: Ambulatory Visit

## 2014-10-26 DIAGNOSIS — Z1231 Encounter for screening mammogram for malignant neoplasm of breast: Secondary | ICD-10-CM

## 2014-12-03 ENCOUNTER — Encounter: Payer: Self-pay | Admitting: Internal Medicine

## 2014-12-03 ENCOUNTER — Ambulatory Visit (INDEPENDENT_AMBULATORY_CARE_PROVIDER_SITE_OTHER): Payer: 59 | Admitting: Internal Medicine

## 2014-12-03 ENCOUNTER — Ambulatory Visit: Payer: 59 | Admitting: Obstetrics & Gynecology

## 2014-12-03 VITALS — BP 120/80 | HR 84 | Temp 97.4°F | Ht 59.75 in | Wt 189.2 lb

## 2014-12-03 DIAGNOSIS — E785 Hyperlipidemia, unspecified: Secondary | ICD-10-CM

## 2014-12-03 DIAGNOSIS — E1169 Type 2 diabetes mellitus with other specified complication: Secondary | ICD-10-CM

## 2014-12-03 DIAGNOSIS — M8580 Other specified disorders of bone density and structure, unspecified site: Secondary | ICD-10-CM

## 2014-12-03 DIAGNOSIS — E119 Type 2 diabetes mellitus without complications: Secondary | ICD-10-CM

## 2014-12-03 DIAGNOSIS — Z23 Encounter for immunization: Secondary | ICD-10-CM

## 2014-12-03 MED ORDER — ALBUTEROL SULFATE HFA 108 (90 BASE) MCG/ACT IN AERS: 1.0000 | INHALATION_SPRAY | Freq: Four times a day (QID) | RESPIRATORY_TRACT | Status: DC | PRN

## 2014-12-03 MED ORDER — FLUTICASONE PROPIONATE 50 MCG/ACT NA SUSP: 1.0000 | Freq: Every day | NASAL | Status: DC

## 2014-12-03 MED ORDER — MONTELUKAST SODIUM 10 MG PO TABS: 10.0000 mg | ORAL_TABLET | Freq: Every day | ORAL | Status: DC | PRN

## 2014-12-03 NOTE — Assessment & Plan Note (Signed)
Follows with gyn - will schedule DEXA next visit with gyn

## 2014-12-03 NOTE — Assessment & Plan Note (Signed)
On metformin -  Also simva and ASA The current medical regimen is effective;  continue present plan and medications. Check a1c q35mo and prn Lab Results  Component Value Date   HGBA1C 6.3 06/01/2014

## 2014-12-03 NOTE — Progress Notes (Signed)
Pre visit review using our clinic review tool, if applicable. No additional management support is needed unless otherwise documented below in the visit note. 

## 2014-12-03 NOTE — Assessment & Plan Note (Signed)
On statin - reviewed lipids annually - The current medical regimen is effective;  continue present plan and medications.

## 2014-12-03 NOTE — Patient Instructions (Addendum)
It was good to see you today.  We have reviewed your prior records including labs and tests today  Prevnar is updated today   Test(s) ordered today. Return when you are fasting. Your results will be released to [place] (or called to you) after review, usually within 72hours after test completion. If any changes need to be made, you will be notified at that same time.  Medications reviewed and updated, no changes recommended at this time. Refill on medication(s) as discussed today.  Please schedule followup in 6 months, call sooner if problems.  Diabetes and Standards of Medical Care Diabetes is complicated. You may find that your diabetes team includes a dietitian, nurse, diabetes educator, eye doctor, and more. To help everyone know what is going on and to help you get the care you deserve, the following schedule of care was developed to help keep you on track. Below are the tests, exams, vaccines, medicines, education, and plans you will need. HbA1c test This test shows how well you have controlled your glucose over the past 2-3 months. It is used to see if your diabetes management plan needs to be adjusted.   It is performed at least 2 times a year if you are meeting treatment goals.  It is performed 4 times a year if therapy has changed or if you are not meeting treatment goals. Blood pressure test  This test is performed at every routine medical visit. The goal is less than 140/90 mm Hg for most people, but 130/80 mm Hg in some cases. Ask your health care provider about your goal. Dental exam  Follow up with the dentist regularly. Eye exam  If you are diagnosed with type 1 diabetes as a child, get an exam upon reaching the age of 17 years or older and have had diabetes for 3-5 years. Yearly eye exams are recommended after that initial eye exam.  If you are diagnosed with type 1 diabetes as an adult, get an exam within 5 years of diagnosis and then yearly.  If you are diagnosed  with type 2 diabetes, get an exam as soon as possible after the diagnosis and then yearly. Foot care exam  Visual foot exams are performed at every routine medical visit. The exams check for cuts, injuries, or other problems with the feet.  A comprehensive foot exam should be done yearly. This includes visual inspection as well as assessing foot pulses and testing for loss of sensation.  Check your feet nightly for cuts, injuries, or other problems with your feet. Tell your health care provider if anything is not healing. Kidney function test (urine microalbumin)  This test is performed once a year.  Type 1 diabetes: The first test is performed 5 years after diagnosis.  Type 2 diabetes: The first test is performed at the time of diagnosis.  A serum creatinine and estimated glomerular filtration rate (eGFR) test is done once a year to assess the level of chronic kidney disease (CKD), if present. Lipid profile (cholesterol, HDL, LDL, triglycerides)  Performed every 5 years for most people.  The goal for LDL is less than 100 mg/dL. If you are at high risk, the goal is less than 70 mg/dL.  The goal for HDL is 40 mg/dL-50 mg/dL for men and 50 mg/dL-60 mg/dL for women. An HDL cholesterol of 60 mg/dL or higher gives some protection against heart disease.  The goal for triglycerides is less than 150 mg/dL. Influenza vaccine, pneumococcal vaccine, and hepatitis B vaccine  The influenza vaccine is recommended yearly.  It is recommended that people with diabetes who are over 59 years old get the pneumonia vaccine. In some cases, two separate shots may be given. Ask your health care provider if your pneumonia vaccination is up to date.  The hepatitis B vaccine is also recommended for adults with diabetes. Diabetes self-management education  Education is recommended at diagnosis and ongoing as needed. Treatment plan  Your treatment plan is reviewed at every medical visit. Document Released:  09/27/2009 Document Revised: 04/16/2014 Document Reviewed: 05/02/2013 [facility] Patient Information 2015 [place], [place]. This information is not intended to replace advice given to you by your health care provider. Make sure you discuss any questions you have with your health care provider.

## 2014-12-03 NOTE — Progress Notes (Signed)
Subjective:    Patient ID: Faith Orr, female    DOB: December 20, 1940, 73 y.o.   MRN: [id number]  HPI  Patient is here for follow up  Reviewed chronic medical issues and interval medical events  Past Medical History  Diagnosis Date  . Obesity, unspecified   . OSTEOPENIA   . URINARY INCONTINENCE   . DEPRESSION   . DIABETES MELLITUS, TYPE II   . HYPERLIPIDEMIA   . Diverticulosis   . Asthma   . Allergy     seasonal  . Macular degeneration of left eye     mild, Dr.Hecker  . Osteopenia     Review of Systems  Constitutional: Positive for fatigue. Negative for fever and unexpected weight change.  Respiratory: Negative for cough and shortness of breath.   Cardiovascular: Negative for chest pain and leg swelling.  Psychiatric/Behavioral: Positive for dysphoric mood. The patient is nervous/anxious.        Objective:   Physical Exam  BP 120/80 mmHg  Pulse 84  Temp(Src) 97.4 F (36.3 C) (Oral)  Ht 4' 11.75" (1.518 m)  Wt 189 lb 4 oz (85.843 kg)  BMI 37.25 kg/m2  SpO2 93%  LMP 10/09/2012 Wt Readings from Last 3 Encounters:  12/03/14 189 lb 4 oz (85.843 kg)  06/01/14 183 lb 12.8 oz (83.371 kg)  10/24/13 191 lb (86.637 kg)   Constitutional: She appears well-developed and well-nourished. No distress.  Neck: Normal range of motion. Neck supple. No JVD present. No thyromegaly present.  Cardiovascular: Normal rate, regular rhythm and normal heart sounds.  No murmur heard. No BLE edema. Pulmonary/Chest: Effort normal and breath sounds normal. No respiratory distress. She has no wheezes.  Psychiatric: She has a normal mood and affect. Her behavior is normal. Judgment and thought content normal.   Lab Results  Component Value Date   WBC 6.6 06/01/2014   HGB 14.4 06/01/2014   HCT 42.3 06/01/2014   PLT 285.0 06/01/2014   GLUCOSE 166* 10/24/2013   CHOL 155 10/24/2013   TRIG 204.0* 10/24/2013   HDL 45.60 10/24/2013   LDLDIRECT 89.6 10/24/2013   LDLCALC 107* 05/28/2010   ALT  30 06/01/2014   AST 38* 06/01/2014   NA 139 10/24/2013   K 3.7 10/24/2013   CL 107 10/24/2013   CREATININE 0.7 10/24/2013   BUN 12 10/24/2013   CO2 25 10/24/2013   TSH 1.06 06/01/2014   HGBA1C 6.3 06/01/2014   MICROALBUR 0.3 06/01/2014    Mm Screening Breast Tomo Bilateral  10/26/2014   CLINICAL DATA:  Screening.  EXAM: DIGITAL SCREENING BILATERAL MAMMOGRAM WITH 3D TOMO WITH CAD  COMPARISON:  Previous exam(s).  ACR Breast Density Category b: There are scattered areas of fibroglandular density.  FINDINGS: There are no findings suspicious for malignancy. Images were processed with CAD.  IMPRESSION: No mammographic evidence of malignancy. A result letter of this screening mammogram will be mailed directly to the patient.  RECOMMENDATION: Screening mammogram in one year. (Code:SM-B-01Y)  BI-RADS CATEGORY  1: Negative.   Electronically Signed   By: Pamelia Hoit M.D.   On: 10/26/2014 17:24       Assessment & Plan:   Problem List Items Addressed This Visit    Diabetes type 2, controlled - Primary    On metformin -  Also simva and ASA The current medical regimen is effective;  continue present plan and medications. Check a1c q19mo and prn Lab Results  Component Value Date   HGBA1C 6.3 06/01/2014  Relevant Orders      Hemoglobin A1c      Basic metabolic panel      Lipid panel      Microalbumin / creatinine urine ratio   Hyperlipidemia associated with type 2 diabetes mellitus    On statin - reviewed lipids annually - The current medical regimen is effective;  continue present plan and medications.     Osteopenia    Follows with gyn - will schedule DEXA next visit with gyn

## 2014-12-05 ENCOUNTER — Other Ambulatory Visit (INDEPENDENT_AMBULATORY_CARE_PROVIDER_SITE_OTHER): Payer: 59

## 2014-12-05 ENCOUNTER — Telehealth: Payer: Self-pay | Admitting: Obstetrics & Gynecology

## 2014-12-05 ENCOUNTER — Ambulatory Visit: Payer: 59 | Admitting: Obstetrics & Gynecology

## 2014-12-05 DIAGNOSIS — E119 Type 2 diabetes mellitus without complications: Secondary | ICD-10-CM

## 2014-12-05 LAB — LIPID PANEL
CHOLESTEROL: 197 mg/dL (ref 0–200)
HDL: 41.5 mg/dL (ref >39.00)
NonHDL: 155.5
Total CHOL/HDL Ratio: 5
Triglycerides: 295 mg/dL — ABNORMAL HIGH (ref 0.0–149.0)
VLDL: 59 mg/dL — AB (ref 0.0–40.0)

## 2014-12-05 LAB — HEMOGLOBIN A1C: Hgb A1c MFr Bld: 6.7 % — ABNORMAL HIGH (ref 4.6–6.5)

## 2014-12-05 LAB — BASIC METABOLIC PANEL
BUN: 15 mg/dL (ref 6–23)
CO2: 25 mEq/L (ref 19–32)
Calcium: 9.5 mg/dL (ref 8.4–10.5)
Chloride: 105 mEq/L (ref 96–112)
Creatinine, Ser: 0.7 mg/dL (ref 0.4–1.2)
GFR: 95.39 mL/min (ref >60.00)
GLUCOSE: 132 mg/dL — AB (ref 70–99)
Potassium: 4.2 mEq/L (ref 3.5–5.1)
SODIUM: 137 meq/L (ref 135–145)

## 2014-12-05 LAB — MICROALBUMIN / CREATININE URINE RATIO
CREATININE, U: 83.9 mg/dL
MICROALB/CREAT RATIO: 0.4 mg/g (ref 0.0–30.0)
Microalb, Ur: 0.3 mg/dL (ref 0.0–1.9)

## 2014-12-05 LAB — LDL CHOLESTEROL, DIRECT: LDL DIRECT: 105.2 mg/dL

## 2014-12-05 NOTE — Telephone Encounter (Signed)
Patient canceled her aex appointment today. Patient said "I just cannot make it to my appointment today ,I'm just so upset" She said she would call later to reschedule. Patient did not give anymore details. Patient understands she will receive a missed appointment fee.

## 2014-12-05 NOTE — Telephone Encounter (Signed)
Please don't apply charges.  Thanks.

## 2015-02-11 ENCOUNTER — Other Ambulatory Visit: Payer: Self-pay | Admitting: Internal Medicine

## 2015-03-26 LAB — HM DIABETES EYE EXAM

## 2015-04-24 ENCOUNTER — Encounter: Payer: Self-pay | Admitting: Internal Medicine

## 2015-04-26 ENCOUNTER — Telehealth: Payer: Self-pay | Admitting: *Deleted

## 2015-04-26 ENCOUNTER — Ambulatory Visit: Payer: Self-pay | Admitting: Obstetrics and Gynecology

## 2015-04-26 NOTE — Telephone Encounter (Signed)
Left voicemail for pt to call back to reschedule appt. appt canceled today.

## 2015-04-29 ENCOUNTER — Ambulatory Visit (INDEPENDENT_AMBULATORY_CARE_PROVIDER_SITE_OTHER): Payer: Medicare Other | Admitting: Obstetrics and Gynecology

## 2015-04-29 ENCOUNTER — Encounter: Payer: Self-pay | Admitting: Obstetrics and Gynecology

## 2015-04-29 VITALS — BP 126/82 | HR 80 | Resp 14 | Ht 60.0 in | Wt 192.4 lb

## 2015-04-29 DIAGNOSIS — M8580 Other specified disorders of bone density and structure, unspecified site: Secondary | ICD-10-CM

## 2015-04-29 DIAGNOSIS — R140 Abdominal distension (gaseous): Secondary | ICD-10-CM | POA: Diagnosis not present

## 2015-04-29 DIAGNOSIS — Z01419 Encounter for gynecological examination (general) (routine) without abnormal findings: Secondary | ICD-10-CM

## 2015-04-29 NOTE — Progress Notes (Signed)
Patient ID: Faith Orr, female   DOB: 02/24/1941, 74 y.o.   MRN: [id number] 74 y.o. G1P1 Single Caucasian female here for annual exam.    Retired Astronomer.   Partner just treated for breast cancer.   Patient is stating a history of bloating.  Has some abdominal cramping.  Has dyspepsia.   Not on HRT.  PCP:  Gwendolyn Grant, MD   Patient's last menstrual period was 10/09/2012.         Prior LMP was 20 years ago.  Sexually active: No.  The current method of family planning is post menopausal status.    Exercising: No.  none presently but recently signed up for water aerobics. Smoker:  no  Health Maintenance: Pap:  12-12-11 wnl:neg HR HPV History of abnormal Pap:  no MMG:  10-26-14 fibroglandular density/nl:[facility] Colonoscopy:  2013 hx of polyps with Dr. Delfin Edis.  Next due in 11/2017. BMD:   11-04-11   Result  Stable (worse T score -1.3) TDaP:  2011  Screening Labs:  Hb today: PCP, Urine today: PCP   reports that she has never smoked. She has never used smokeless tobacco. She reports that she does not drink alcohol or use illicit drugs.  Past Medical History  Diagnosis Date  . Obesity, unspecified   . OSTEOPENIA   . URINARY INCONTINENCE   . DEPRESSION   . DIABETES MELLITUS, TYPE II   . HYPERLIPIDEMIA   . Diverticulosis   . Asthma   . Allergy     seasonal  . Macular degeneration of left eye     mild, Dr.Hecker  . Osteopenia     Past Surgical History  Procedure Laterality Date  . Cesarean section  01/1973  . Right ankle  1994  . Left wrist surgery  2008    By Dr. Latanya Maudlin    Current Outpatient Prescriptions  Medication Sig Dispense Refill  . albuterol (PROAIR HFA) 108 (90 BASE) MCG/ACT inhaler Inhale 1-2 puffs into the lungs every 6 (six) hours as needed for wheezing or shortness of breath. 18 g 1  . Blood Glucose Monitoring Suppl (FREESTYLE FREEDOM LITE) W/DEVICE KIT Use to check blood sugars twice a day Dx 250.00 1 each 0  . Calcium  Carbonate-Vitamin D (CALCIUM 600+D HIGH POTENCY) 600-400 MG-UNIT per tablet Take 1 tablet by mouth 2 (two) times daily.     Marland Kitchen doxycycline (VIBRA-TABS) 100 MG tablet Take 1 tablet by mouth 2 (two) times daily.    . fluticasone (FLONASE) 50 MCG/ACT nasal spray Place 1 spray into both nostrils daily. 16 g 2  . glucose blood (FREESTYLE LITE) test strip CHECK BLOOD SUGAR TWICE DAILY AS DIRECTED Dx 250.00 180 each 3  . ibuprofen (ADVIL,MOTRIN) 200 MG tablet Take 200 mg by mouth as needed.      . Lancets (FREESTYLE) lancets 1 each by Other route 2 (two) times daily. Use as instructed     . metFORMIN (GLUCOPHAGE-XR) 500 MG 24 hr tablet TAKE 1 TABLET BY MOUTH TWICE DAILY 180 tablet 3  . METROGEL 1 % gel Apply 1 application topically daily.     . montelukast (SINGULAIR) 10 MG tablet Take 1 tablet (10 mg total) by mouth daily as needed. 30 tablet 5  . Multiple Vitamin (MULTIVITAMIN) tablet Take 1 tablet by mouth daily.      . Multiple Vitamins-Minerals (ICAPS) CAPS Take 1 capsule by mouth daily.      . sertraline (ZOLOFT) 50 MG tablet TAKE 1 TABLET BY MOUTH  EVERY DAY 90 tablet 3  . simvastatin (ZOCOR) 20 MG tablet TAKE 1 TABLET BY MOUTH DAILY. 90 tablet 3   No current facility-administered medications for this visit.    Family History  Problem Relation Age of Onset  . Diabetes Father   . Hyperlipidemia Father   . Heart disease Father   . Multiple sclerosis Daughter   . Cancer      bladder  . Colon cancer Paternal Grandmother   . Osteoporosis Mother   . Protein S deficiency Mother     ROS:  Pertinent items are noted in HPI.  Otherwise, a comprehensive ROS was negative.  Exam:   BP 126/82 mmHg  Pulse 80  Resp 14  Ht 5' (1.524 m)  Wt 192 lb 6.4 oz (87.272 kg)  BMI 37.58 kg/m2  LMP 10/09/2012    General appearance: alert, cooperative and appears stated age Head: Normocephalic, without obvious abnormality, atraumatic Neck: no adenopathy, supple, symmetrical, trachea midline and thyroid  normal to inspection and palpation Lungs: clear to auscultation bilaterally Breasts: normal appearance, no masses or tenderness, Inspection negative, No nipple retraction or dimpling, No nipple discharge or bleeding, No axillary or supraclavicular adenopathy Heart: regular rate and rhythm Abdomen: soft, non-tender; bowel sounds normal; no masses,  no organomegaly Extremities: extremities normal, atraumatic, no cyanosis or edema Skin: Skin color, texture, turgor normal. No rashes or lesions Lymph nodes: Cervical, supraclavicular, and axillary nodes normal. No abnormal inguinal nodes palpated Neurologic: Grossly normal  Pelvic: External genitalia:  no lesions              Urethra:  normal appearing urethra with no masses, tenderness or lesions              Bartholins and Skenes: normal                 Vagina: normal appearing vagina with normal color and discharge, no lesions              Cervix: no lesions              Pap taken: Yes.   Bimanual Exam:  Uterus:  normal size, contour, position, consistency, mobility, non-tender  Exam limited by body habitus.               Adnexa: normal adnexa and no mass, fullness, tenderness              Rectovaginal: Yes.  .  Confirms.              Anus:  normal sphincter tone, no lesions  Chaperone was present for exam.  Assessment:   Well woman visit with normal exam. Abdominal bloating.  Mild osteopenia.  Plan: Yearly mammogram recommended after age 15.  Recommended self breast exam.  Pap and HR HPV as above.  Pap only done.  Discussed Calcium, Vitamin D, regular exercise program including cardiovascular and weight bearing exercise. Labs performed.  No.    Prescription given.  No.  Discussed pelvic ultrasound to evaluate ovaries.  Follow up annually and prn.     After visit summary provided.

## 2015-04-29 NOTE — Patient Instructions (Signed)

## 2015-04-30 LAB — IPS PAP SMEAR ONLY

## 2015-05-03 ENCOUNTER — Telehealth: Payer: Self-pay | Admitting: Obstetrics and Gynecology

## 2015-05-03 ENCOUNTER — Ambulatory Visit: Payer: Self-pay | Admitting: Obstetrics & Gynecology

## 2015-05-03 NOTE — Telephone Encounter (Signed)
Left message for patient to call back. Need to go over benefits and schedule PUS °

## 2015-05-08 NOTE — Telephone Encounter (Signed)
Call to patient. Advised of benefit quote received for PUS. °Patient agreeable. Scheduled PUS. °Advised patient of 72 hour cancellation policy and $100 cancellation fee. Patient agreeable. °

## 2015-05-16 ENCOUNTER — Ambulatory Visit (INDEPENDENT_AMBULATORY_CARE_PROVIDER_SITE_OTHER): Payer: Medicare Other

## 2015-05-16 ENCOUNTER — Encounter: Payer: Self-pay | Admitting: Obstetrics and Gynecology

## 2015-05-16 ENCOUNTER — Ambulatory Visit (INDEPENDENT_AMBULATORY_CARE_PROVIDER_SITE_OTHER): Payer: Medicare Other | Admitting: Obstetrics and Gynecology

## 2015-05-16 VITALS — BP 124/76 | HR 80 | Ht 60.0 in | Wt 192.0 lb

## 2015-05-16 DIAGNOSIS — R140 Abdominal distension (gaseous): Secondary | ICD-10-CM

## 2015-05-16 NOTE — Progress Notes (Signed)
Subjective  74 y.o. G53P1    Caucasian female here for pelvic ultrasound for pelvic ultrasound due to abdominal bloating.   Patient sees Dr. Olevia Perches for GI care.   Has left sided menstrual type cramps this am.   Objective  Pelvic ultrasound images and report reviewed with patient.  Uterus - no masses. EMS - 3.18 mm Ovaries - right ovary 3 mm follicle.  Left ovary 5 mm follicle. Free fluid - no     Assessment  Abdominal bloating.  History of diverticulosis. Normal pelvic ultrasound.   Plan  Reassurance given regarding reproductive anatomy.  No signs of ovarian cancer.  Follow up with GI. Patient will call Dr. Nichola Sizer office where she is established.  ____15___ minutes face to face time of which over 50% was spent in counseling.

## 2015-05-19 ENCOUNTER — Ambulatory Visit (INDEPENDENT_AMBULATORY_CARE_PROVIDER_SITE_OTHER): Payer: Medicare Other

## 2015-05-19 ENCOUNTER — Ambulatory Visit (INDEPENDENT_AMBULATORY_CARE_PROVIDER_SITE_OTHER): Payer: Medicare Other | Admitting: Emergency Medicine

## 2015-05-19 VITALS — BP 110/75 | HR 83 | Temp 97.9°F | Resp 12 | Ht 60.0 in | Wt 193.5 lb

## 2015-05-19 DIAGNOSIS — M25511 Pain in right shoulder: Secondary | ICD-10-CM

## 2015-05-19 NOTE — Progress Notes (Signed)
Subjective:    Patient ID: Faith Orr, female    DOB: Apr 02, 1941, 74 y.o.   MRN: [id number] This chart was scribed for Faith Russian, MD by Martinique Peace, ED Scribe. The patient was seen in [facility]06. The patient's care was started at 4:29 PM.   Chief Complaint  Patient presents with  . Fall    today at 11:15am  . Shoulder Pain    Right     HPI  HPI Comments: Faith Orr is a 74 y.o. female who presents to the [facility] complaining of right arm injury that occurred earlier today around 11:15 AM when she tripped. She explains she is not sure whether she fell directly on her arm or if her arm was outstretched during incident. Pt now complains of right shoulder pain. She reports experiencing initial "burning" sensation in her right hand afterwards that has since resolved. She reports pain is relieved with keeping her arm still and close to her body, exacerbated with movement. Pt denies any pain in elbow or anywhere else. She further denies any impacts to the head or LOC during incident.    Past Medical History  Diagnosis Date  . Obesity, unspecified   . OSTEOPENIA   . URINARY INCONTINENCE   . DEPRESSION   . DIABETES MELLITUS, TYPE II   . HYPERLIPIDEMIA   . Diverticulosis   . Asthma   . Allergy     seasonal  . Macular degeneration of left eye     mild, Dr.Hecker  . Osteopenia    Past Surgical History  Procedure Laterality Date  . Cesarean section  01/1973  . Right ankle  1994  . Left wrist surgery  2008    By Dr. Latanya Maudlin  . Breast surgery    . Fracture surgery     Allergies  Allergen Reactions  . Penicillins Anaphylaxis    "serum sickness"  . Aleve [Naproxen Sodium] Swelling    Swelling of face  . Sulfonamide Derivatives Rash   Current Outpatient Prescriptions on File Prior to Visit  Medication Sig Dispense Refill  . albuterol (PROAIR HFA) 108 (90 BASE) MCG/ACT inhaler Inhale 1-2 puffs into the lungs every 6 (six) hours as needed for wheezing or shortness of breath. 18 g 1   . Blood Glucose Monitoring Suppl (FREESTYLE FREEDOM LITE) W/DEVICE KIT Use to check blood sugars twice a day Dx 250.00 1 each 0  . Calcium Carbonate-Vitamin D (CALCIUM 600+D HIGH POTENCY) 600-400 MG-UNIT per tablet Take 1 tablet by mouth 2 (two) times daily.     . fluticasone (FLONASE) 50 MCG/ACT nasal spray Place 1 spray into both nostrils daily. (Patient taking differently: Place 1 spray into both nostrils daily as needed. ) 16 g 2  . glucose blood (FREESTYLE LITE) test strip CHECK BLOOD SUGAR TWICE DAILY AS DIRECTED Dx 250.00 180 each 3  . ibuprofen (ADVIL,MOTRIN) 200 MG tablet Take 200 mg by mouth as needed.      . Lancets (FREESTYLE) lancets 1 each by Other route 2 (two) times daily. Use as instructed     . metFORMIN (GLUCOPHAGE-XR) 500 MG 24 hr tablet TAKE 1 TABLET BY MOUTH TWICE DAILY 180 tablet 3  . METROGEL 1 % gel Apply 1 application topically daily.     . Multiple Vitamin (MULTIVITAMIN) tablet Take 1 tablet by mouth daily.      . Multiple Vitamins-Minerals (ICAPS) CAPS Take 1 capsule by mouth daily.      . sertraline (ZOLOFT) 50 MG tablet TAKE  1 TABLET BY MOUTH EVERY DAY 90 tablet 3  . simvastatin (ZOCOR) 20 MG tablet TAKE 1 TABLET BY MOUTH DAILY. 90 tablet 3   No current facility-administered medications on file prior to visit.       Review of Systems  Musculoskeletal: Positive for arthralgias.       Right shoulder pain.   Neurological: Negative for syncope and headaches.       Objective:   Physical Exam  Constitutional: She is oriented to person, place, and time. She appears well-developed and well-nourished. No distress.  HENT:  Head: Normocephalic and atraumatic.  Eyes: Conjunctivae and EOM are normal.  Neck: Neck supple. No tracheal deviation present.  Cardiovascular: Normal rate, regular rhythm and normal heart sounds.  Exam reveals no gallop and no friction rub.   No murmur heard. Pulmonary/Chest: Effort normal and breath sounds normal. No respiratory  distress. She has no wheezes. She has no rales.  Musculoskeletal: She exhibits tenderness.  Right shoulder- Severe pain with external rotation. Pain with abduction.   Neurological: She is alert and oriented to person, place, and time.  Skin: Skin is warm and dry.  Psychiatric: She has a normal mood and affect. Her behavior is normal.  Nursing note and vitals reviewed.    Filed Vitals:   05/19/15 1606  BP: 110/75  Pulse: 83  Temp: 97.9 F (36.6 C)  Resp: 12    4:32 PM- Treatment plan was discussed with patient who verbalizes understanding and agrees.  [facility] reading (PRIMARY) by  Dr.Keyundra Fant please comment on the humeral head. There is a mild separation at the before meals joint but no definite fractures are seen      Assessment & Plan:

## 2015-05-28 ENCOUNTER — Other Ambulatory Visit: Payer: Self-pay

## 2015-06-03 ENCOUNTER — Ambulatory Visit
Admission: RE | Admit: 2015-06-03 | Discharge: 2015-06-03 | Disposition: A | Discharge status: Home / Self Care | Payer: Medicare Other | Source: Ambulatory Visit | Attending: Obstetrics and Gynecology | Admitting: Obstetrics and Gynecology

## 2015-06-03 DIAGNOSIS — M8580 Other specified disorders of bone density and structure, unspecified site: Secondary | ICD-10-CM

## 2015-06-18 ENCOUNTER — Encounter: Payer: Self-pay | Admitting: Internal Medicine

## 2015-06-18 ENCOUNTER — Ambulatory Visit: Payer: 59 | Admitting: Internal Medicine

## 2015-06-18 ENCOUNTER — Other Ambulatory Visit (INDEPENDENT_AMBULATORY_CARE_PROVIDER_SITE_OTHER): Payer: Medicare Other

## 2015-06-18 ENCOUNTER — Ambulatory Visit (INDEPENDENT_AMBULATORY_CARE_PROVIDER_SITE_OTHER): Payer: Medicare Other | Admitting: Internal Medicine

## 2015-06-18 VITALS — BP 138/76 | HR 79 | Temp 97.8°F | Resp 16 | Ht 59.0 in | Wt 193.8 lb

## 2015-06-18 DIAGNOSIS — E119 Type 2 diabetes mellitus without complications: Secondary | ICD-10-CM

## 2015-06-18 DIAGNOSIS — M25511 Pain in right shoulder: Secondary | ICD-10-CM | POA: Diagnosis not present

## 2015-06-18 LAB — HEMOGLOBIN A1C: Hgb A1c MFr Bld: 6.8 % — ABNORMAL HIGH (ref 4.6–6.5)

## 2015-06-18 NOTE — Assessment & Plan Note (Signed)
Will try physical therapy for her shoulder. She does not have signs of tendon tear on exam. Some ROM active and slightly more passive. In order to avoid frozen shoulder PT. If no resolution recommended sports medicine evaluation.

## 2015-06-18 NOTE — Progress Notes (Signed)
   Subjective:    Patient ID: Faith Orr, female    DOB: Nov 30, 1941, 74 y.o.   MRN: [id number]  HPI The patient is a 74 YO female who is coming in today for right shoulder pain. She had fallen at home 1 month ago and landed on the shoulder. She did seek treatment at urgent care at that time. There were no fractures or dislocations at that time. She is having less pain now but still some stiffness. Originally she was not able to move the shoulder and arm. She is now able to move it some but not full ROM. She would like to try physical therapy.   Review of Systems  Constitutional: Negative.   Respiratory: Negative for cough, chest tightness and shortness of breath.   Cardiovascular: Negative for chest pain, palpitations and leg swelling.  Gastrointestinal: Negative for abdominal pain, diarrhea, constipation and abdominal distention.  Musculoskeletal: Positive for myalgias and arthralgias. Negative for back pain and gait problem.  Skin: Negative.   Neurological: Negative for dizziness, weakness, light-headedness and headaches.      Objective:   Physical Exam  Constitutional: She is oriented to person, place, and time. She appears well-developed and well-nourished.  HENT:  Head: Normocephalic and atraumatic.  Eyes: EOM are normal.  Neck: Normal range of motion.  Cardiovascular: Normal rate and regular rhythm.   Pulmonary/Chest: Effort normal. No respiratory distress. She has no wheezes.  Abdominal: Soft.  Musculoskeletal: She exhibits tenderness. She exhibits no edema.  Tenderness along the right scapula and into the right biceps, no pain along the pectoral muscle.   Neurological: She is alert and oriented to person, place, and time.  Skin: Skin is warm and dry.  Psychiatric: She has a normal mood and affect.   Filed Vitals:   06/18/15 1055  BP: 138/76  Pulse: 79  Temp: 97.8 F (36.6 C)  TempSrc: Oral  Resp: 16  Height: 4\' 11"  (1.499 m)  Weight: 193 lb 12.8 oz (87.907 kg)    SpO2: 97%      Assessment & Plan:

## 2015-06-18 NOTE — Progress Notes (Signed)
Pre visit review using our clinic review tool, if applicable. No additional management support is needed unless otherwise documented below in the visit note. 

## 2015-06-18 NOTE — Patient Instructions (Signed)
We will have you do physical therapy on your shoulder. If this does not do well then call us back and we can try either your orthopedic or our sports medicine doctor.   We are checking the labs for the diabetes today and will call you back with the results.   Come back in about 6 months and if you have any problems or questions before then please feel free to call the office.   Diabetes and Exercise Exercising regularly is important. It is not just about losing weight. It has many health benefits, such as:  Improving your overall fitness, flexibility, and endurance.  Increasing your bone density.  Helping with weight control.  Decreasing your body fat.  Increasing your muscle strength.  Reducing stress and tension.  Improving your overall health. People with diabetes who exercise gain additional benefits because exercise:  Reduces appetite.  Improves the body's use of blood sugar (glucose).  Helps lower or control blood glucose.  Decreases blood pressure.  Helps control blood lipids (such as cholesterol and triglycerides).  Improves the body's use of the hormone insulin by:  Increasing the body's insulin sensitivity.  Reducing the body's insulin needs.  Decreases the risk for heart disease because exercising:  Lowers cholesterol and triglycerides levels.  Increases the levels of good cholesterol (such as high-density lipoproteins [HDL]) in the body.  Lowers blood glucose levels. YOUR ACTIVITY PLAN  Choose an activity that you enjoy and set realistic goals. Your health care provider or diabetes educator can help you make an activity plan that works for you. Exercise regularly as directed by your health care provider. This includes:  Performing resistance training twice a week such as push-ups, sit-ups, lifting weights, or using resistance bands.  Performing 150 minutes of cardio exercises each week such as walking, running, or playing sports.  Staying active and  spending no more than 90 minutes at one time being inactive. Even short bursts of exercise are good for you. Three 10-minute sessions spread throughout the day are just as beneficial as a single 30-minute session. Some exercise ideas include:  Taking the dog for a walk.  Taking the stairs instead of the elevator.  Dancing to your favorite song.  Doing an exercise video.  Doing your favorite exercise with a friend. RECOMMENDATIONS FOR EXERCISING WITH TYPE 1 OR TYPE 2 DIABETES   Check your blood glucose before exercising. If blood glucose levels are greater than 240 mg/dL, check for urine ketones. Do not exercise if ketones are present.  Avoid injecting insulin into areas of the body that are going to be exercised. For example, avoid injecting insulin into:  The arms when playing tennis.  The legs when jogging.  Keep a record of:  Food intake before and after you exercise.  Expected peak times of insulin action.  Blood glucose levels before and after you exercise.  The type and amount of exercise you have done.  Review your records with your health care provider. Your health care provider will help you to develop guidelines for adjusting food intake and insulin amounts before and after exercising.  If you take insulin or oral hypoglycemic agents, watch for signs and symptoms of hypoglycemia. They include:  Dizziness.  Shaking.  Sweating.  Chills.  Confusion.  Drink plenty of water while you exercise to prevent dehydration or heat stroke. Body water is lost during exercise and must be replaced.  Talk to your health care provider before starting an exercise program to make sure it is  safe for you. Remember, almost any type of activity is better than none. Document Released: 02/20/2004 Document Revised: 04/16/2014 Document Reviewed: 05/09/2013 [facility] Patient Information 2015 [place], [place]. This information is not intended to replace advice given to you by your health  care provider. Make sure you discuss any questions you have with your health care provider.

## 2015-06-18 NOTE — Assessment & Plan Note (Signed)
Denies high or low sugars. She is currently taking metformin XR 1000 mg BID. If she really has the 24 slow release pills may not need to take twice a day. Not on ARB or ACE-I at this time. Last protein check negative.

## 2015-07-05 ENCOUNTER — Other Ambulatory Visit: Payer: Self-pay | Admitting: Internal Medicine

## 2015-08-12 ENCOUNTER — Encounter: Payer: Self-pay | Admitting: Internal Medicine

## 2015-08-20 ENCOUNTER — Encounter: Payer: Self-pay | Admitting: Internal Medicine

## 2015-08-30 ENCOUNTER — Ambulatory Visit (INDEPENDENT_AMBULATORY_CARE_PROVIDER_SITE_OTHER): Payer: Medicare Other | Admitting: Family Medicine

## 2015-08-30 VITALS — BP 132/76 | HR 100 | Temp 98.0°F | Resp 16 | Ht 60.0 in | Wt 195.4 lb

## 2015-08-30 DIAGNOSIS — G252 Other specified forms of tremor: Secondary | ICD-10-CM | POA: Diagnosis not present

## 2015-08-30 DIAGNOSIS — B86 Scabies: Secondary | ICD-10-CM

## 2015-08-30 MED ORDER — IVERMECTIN 3 MG PO TABS: 12.0000 mg | ORAL_TABLET | Freq: Once | ORAL | Status: DC

## 2015-08-30 MED ORDER — PREDNISONE 20 MG PO TABS: 20.0000 mg | ORAL_TABLET | Freq: Every day | ORAL | Status: DC

## 2015-08-30 NOTE — Patient Instructions (Signed)
Tremor Tremor is a rhythmic, involuntary muscular contraction characterized by oscillations (to-and-fro movements) of a part of the body. The most common of all involuntary movements, tremor can affect various body parts such as the hands, head, facial structures, vocal cords, trunk, and legs; most tremors, however, occur in the hands. Tremor often accompanies neurological disorders associated with aging. Although the disorder is not life-threatening, it can be responsible for functional disability and social embarrassment. TREATMENT  There are many types of tremor and several ways in which tremor is classified. The most common classification is by behavioral context or position. There are five categories of tremor within this classification: resting, postural, kinetic, task-specific, and psychogenic. Resting or static tremor occurs when the muscle is at rest, for example when the hands are lying on the lap. This type of tremor is often seen in patients with Parkinson's disease. Postural tremor occurs when a patient attempts to maintain posture, such as holding the hands outstretched. Postural tremors include physiological tremor, essential tremor, tremor with basal ganglia disease (also seen in patients with Parkinson's disease), cerebellar postural tremor, tremor with peripheral neuropathy, post-traumatic tremor, and alcoholic tremor. Kinetic or intention (action) tremor occurs during purposeful movement, for example during finger-to-nose testing. Task-specific tremor appears when performing goal-oriented tasks such as handwriting, speaking, or standing. This group consists of primary writing tremor, vocal tremor, and orthostatic tremor. Psychogenic tremor occurs in both older and younger patients. The key feature of this tremor is that it dramatically lessens or disappears when the patient is distracted. PROGNOSIS There are some treatment options available for tremor; the appropriate treatment depends on  accurate diagnosis of the cause. Some tremors respond to treatment of the underlying condition, for example in some cases of psychogenic tremor treating the patient's underlying mental problem may cause the tremor to disappear. Also, patients with tremor due to Parkinson's disease may be treated with Levodopa drug therapy. Symptomatic drug therapy is available for several other tremors as well. For those cases of tremor in which there is no effective drug treatment, physical measures such as teaching the patient to brace the affected limb during the tremor are sometimes useful. Surgical intervention such as thalamotomy or deep brain stimulation may be useful in certain cases. Document Released: 11/20/2002 Document Revised: 02/22/2012 Document Reviewed: 11/30/2005 [facility] Patient Information 2015 [place], [place]. This information is not intended to replace advice given to you by your health care provider. Make sure you discuss any questions you have with your health care provider. Scabies Scabies are small bugs (mites) that burrow under the skin and cause red bumps and severe itching. These bugs can only be seen with a microscope. Scabies are highly contagious. They can spread easily from person to person by direct contact. They are also spread through sharing clothing or linens that have the scabies mites living in them. It is not unusual for an entire family to become infected through shared towels, clothing, or bedding.  HOME CARE INSTRUCTIONS   Your caregiver may prescribe a cream or lotion to kill the mites. If cream is prescribed, massage the cream into the entire body from the neck to the bottom of both feet. Also massage the cream into the scalp and face if your child is less than 35 year old. Avoid the eyes and mouth. Do not wash your hands after application.  Leave the cream on for 8 to 12 hours. Your child should bathe or shower after the 8 to 12 hour application period. Sometimes it is helpful to  apply the cream to your child right before bedtime.  One treatment is usually effective and will eliminate approximately 95% of infestations. For severe cases, your caregiver may decide to repeat the treatment in 1 week. Everyone in your household should be treated with one application of the cream.  New rashes or burrows should not appear within 24 to 48 hours after successful treatment. However, the itching and rash may last for 2 to 4 weeks after successful treatment. Your caregiver may prescribe a medicine to help with the itching or to help the rash go away more quickly.  Scabies can live on clothing or linens for up to 3 days. All of your child's recently used clothing, towels, stuffed toys, and bed linens should be washed in hot water and then dried in a dryer for at least 20 minutes on high heat. Items that cannot be washed should be enclosed in a plastic bag for at least 3 days.  To help relieve itching, bathe your child in a cool bath or apply cool washcloths to the affected areas.  Your child may return to school after treatment with the prescribed cream. SEEK MEDICAL CARE IF:   The itching persists longer than 4 weeks after treatment.  The rash spreads or becomes infected. Signs of infection include red blisters or yellow-tan crust. Document Released: 11/30/2005 Document Revised: 02/22/2012 Document Reviewed: 04/10/2009 [facility] Patient Information 2015 [place], [place]. This information is not intended to replace advice given to you by your health care provider. Make sure you discuss any questions you have with your health care provider.

## 2015-08-30 NOTE — Progress Notes (Signed)
Patient ID: Faith Orr, female   DOB: [birth year], 74 y.o.   MRN: [id number]  This chart was scribed for Faith Haber, MD by Ladene Artist, ED Scribe. The patient was seen in room 1. Patient's care was started at 2:52 PM.  Patient ID: Faith Orr MRN: [id number], DOB: February 11, 1941, 74 y.o. Date of Encounter: 08/30/2015, 2:52 PM  Primary Physician: Faith Millers, MD  Chief Complaint  Patient presents with   Rash    around eyes, hands, arm and leg, x 2 days    HPI: 74 y.o. year old female with history below presents with a gradually spreading pruritic rash to bilateral hands, left arm, legs and around her eyes for the past 3 days. Pt states that she was working in her garden 4 days ago. These areas are very pruritic. She also reports that her left eye was swollen shut upon waking this morning. No treatments tried PTA. She denies SOB or any other respiratory symptoms at this time.   Tremor Pt presents with a resting tremor in bilateral hands. Tremor resolves with ABduction of her fingers.   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 Pt's husband passed from Parkinson's disease 2 years ago.   Past Medical History  Diagnosis Date   Obesity, unspecified    OSTEOPENIA    URINARY INCONTINENCE    DEPRESSION    DIABETES MELLITUS, TYPE II    HYPERLIPIDEMIA    Diverticulosis    Asthma    Allergy     seasonal   Macular degeneration of left eye     mild, Dr.Hecker   Osteopenia      Home Meds: Prior to Admission medications   Medication Sig Start Date End Date Taking? Authorizing Provider  albuterol (PROAIR HFA) 108 (90 BASE) MCG/ACT inhaler Inhale 1-2 puffs into the lungs every 6 (six) hours as needed for wheezing or shortness of breath. 12/03/14  Yes Rowe Clack, MD  Blood Glucose Monitoring Suppl (FREESTYLE FREEDOM LITE) W/DEVICE KIT Use to check blood sugars twice a day Dx 250.00 06/01/14  Yes Rowe Clack, MD  Calcium  Carbonate-Vitamin D (CALCIUM 600+D HIGH POTENCY) 600-400 MG-UNIT per tablet Take 1 tablet by mouth 2 (two) times daily.    Yes Historical Provider, MD  glucose blood (FREESTYLE LITE) test strip CHECK BLOOD SUGAR TWICE DAILY AS DIRECTED Dx 250.00 07/13/14  Yes Rowe Clack, MD  ibuprofen (ADVIL,MOTRIN) 200 MG tablet Take 200 mg by mouth as needed.     Yes Historical Provider, MD  Lancets (FREESTYLE) lancets 1 each by Other route 2 (two) times daily. Use as instructed    Yes Historical Provider, MD  metFORMIN (GLUCOPHAGE-XR) 500 MG 24 hr tablet TAKE 1 TABLET BY MOUTH TWICE DAILY 02/12/15  Yes Rowe Clack, MD  METROGEL 1 % gel Apply 1 application topically daily.  05/24/12  Yes Historical Provider, MD  Multiple Vitamin (MULTIVITAMIN) tablet Take 1 tablet by mouth daily.     Yes Historical Provider, MD  Multiple Vitamins-Minerals (ICAPS) CAPS Take 1 capsule by mouth daily.     Yes Historical Provider, MD  sertraline (ZOLOFT) 50 MG tablet Take 1 tablet (50 mg total) by mouth daily. 07/08/15  Yes Faith Millers, MD  simvastatin (ZOCOR) 20 MG tablet Take 1 tablet (20 mg total) by mouth daily. 07/08/15  Yes Faith Millers, MD  fluticasone (FLONASE) 50 MCG/ACT nasal spray Place 1 spray into both nostrils daily. Patient not taking:  Reported on 08/30/2015 12/03/14   Rowe Clack, MD    Allergies:  Allergies  Allergen Reactions   Penicillins Anaphylaxis    "serum sickness"   Aleve [Naproxen Sodium] Swelling    Swelling of face   Sulfonamide Derivatives Rash    Social History   Social History   Marital Status: Single    Spouse Name: N/A   Number of Children: 1   Years of Education: N/A   Occupational History    [employer]   Social History Main Topics   Smoking status: Never Smoker    Smokeless tobacco: Never Used     Comment: Lives with partner Faith Orr) and son   Alcohol Use: No   Drug Use: No   Sexual Activity: No     Comment: Lives  with female partner (Faith Orr) and 68 yo son   Other Topics Concern   Not on file   Social History Narrative     Review of Systems: Constitutional: negative for chills, fever, night sweats, weight changes, or fatigue  HEENT: negative for vision changes, hearing loss, congestion, rhinorrhea, ST, epistaxis, or sinus pressure, + facial swelling Cardiovascular: negative for chest pain or palpitations Respiratory: negative for shortness of breath, hemoptysis, wheezing, or cough Abdominal: negative for abdominal pain, nausea, vomiting, diarrhea, or constipation Dermatological: + rash Neurologic: negative for headache, dizziness, or syncope, + tremor All other systems reviewed and are otherwise negative with the exception to those above and in the HPI.  Physical Exam: Blood pressure 132/76, pulse 100, temperature 98 F (36.7 C), temperature source Oral, resp. rate 16, height 5' (1.524 m), weight 195 lb 6.4 oz (88.633 kg), last menstrual period 10/09/2012, SpO2 97 %., Body mass index is 38.16 kg/(m^2). General: Well developed, well nourished, in no acute distress. Head: Normocephalic, atraumatic, eyes without discharge, sclera non-icteric, nares are without discharge. Mild swelling around orbital regions with mild erythema. Bilateral auditory canals clear, TM's are without perforation, pearly grey and translucent with reflective cone of light bilaterally. Oral cavity moist, posterior pharynx without exudate, erythema, peritonsillar abscess, or post nasal drip.  Neck: Supple. No thyromegaly. Full ROM. No lymphadenopathy. Lungs: Clear bilaterally to auscultation without wheezes, rales, or rhonchi. Breathing is unlabored. Heart: RRR with S1 S2. No murmurs, rubs, or gallops appreciated. Abdomen: Soft, non-tender, non-distended with normoactive bowel sounds. No hepatomegaly. No rebound/guarding. No obvious abdominal masses. Msk:  Strength and tone normal for age. Extremities/Skin: Warm and dry.  No clubbing or cyanosis. No edema. No rashes or suspicious lesions. Interdigital papules with similar papules to proximal L forearm and biceps area.   Neuro: Alert and oriented X 3. Moves all extremities spontaneously. Gait is normal. CNII-XII grossly in tact. Good coordination, good strength, symmetrical muscle mass and a very fine tremor which disappears when she ABducts her fingers. Psych:  Responds to questions appropriately with a normal affect.    ASSESSMENT AND PLAN:  74 y.o. year old female with  1. Scabies   2. Action tremor    This chart was scribed in my presence and reviewed by me personally.    ICD-9-CM ICD-10-CM   1. 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

## 2015-10-01 ENCOUNTER — Other Ambulatory Visit: Payer: Self-pay

## 2015-10-01 DIAGNOSIS — Z1231 Encounter for screening mammogram for malignant neoplasm of breast: Secondary | ICD-10-CM

## 2015-10-04 ENCOUNTER — Ambulatory Visit (INDEPENDENT_AMBULATORY_CARE_PROVIDER_SITE_OTHER): Payer: Medicare Other

## 2015-10-04 DIAGNOSIS — Z23 Encounter for immunization: Secondary | ICD-10-CM | POA: Diagnosis not present

## 2015-10-14 ENCOUNTER — Encounter (HOSPITAL_BASED_OUTPATIENT_CLINIC_OR_DEPARTMENT_OTHER): Payer: Self-pay

## 2015-10-14 ENCOUNTER — Emergency Department (HOSPITAL_BASED_OUTPATIENT_CLINIC_OR_DEPARTMENT_OTHER)
Admission: EM | Admit: 2015-10-14 | Discharge: 2015-10-14 | Disposition: A | Discharge status: Home / Self Care | Payer: Medicare Other | Attending: Emergency Medicine | Admitting: Emergency Medicine

## 2015-10-14 ENCOUNTER — Telehealth: Payer: Self-pay | Admitting: Internal Medicine

## 2015-10-14 ENCOUNTER — Emergency Department (HOSPITAL_BASED_OUTPATIENT_CLINIC_OR_DEPARTMENT_OTHER): Payer: Medicare Other

## 2015-10-14 DIAGNOSIS — Z8669 Personal history of other diseases of the nervous system and sense organs: Secondary | ICD-10-CM | POA: Diagnosis not present

## 2015-10-14 DIAGNOSIS — J45909 Unspecified asthma, uncomplicated: Secondary | ICD-10-CM | POA: Diagnosis not present

## 2015-10-14 DIAGNOSIS — F329 Major depressive disorder, single episode, unspecified: Secondary | ICD-10-CM | POA: Diagnosis not present

## 2015-10-14 DIAGNOSIS — Z7951 Long term (current) use of inhaled steroids: Secondary | ICD-10-CM | POA: Diagnosis not present

## 2015-10-14 DIAGNOSIS — R1013 Epigastric pain: Secondary | ICD-10-CM | POA: Insufficient documentation

## 2015-10-14 DIAGNOSIS — Z880 Allergy status to penicillin: Secondary | ICD-10-CM | POA: Insufficient documentation

## 2015-10-14 DIAGNOSIS — E119 Type 2 diabetes mellitus without complications: Secondary | ICD-10-CM | POA: Insufficient documentation

## 2015-10-14 DIAGNOSIS — R112 Nausea with vomiting, unspecified: Secondary | ICD-10-CM | POA: Diagnosis not present

## 2015-10-14 DIAGNOSIS — Z7952 Long term (current) use of systemic steroids: Secondary | ICD-10-CM | POA: Insufficient documentation

## 2015-10-14 DIAGNOSIS — E785 Hyperlipidemia, unspecified: Secondary | ICD-10-CM | POA: Diagnosis not present

## 2015-10-14 DIAGNOSIS — M8580 Other specified disorders of bone density and structure, unspecified site: Secondary | ICD-10-CM | POA: Diagnosis not present

## 2015-10-14 DIAGNOSIS — R079 Chest pain, unspecified: Secondary | ICD-10-CM | POA: Diagnosis present

## 2015-10-14 DIAGNOSIS — E669 Obesity, unspecified: Secondary | ICD-10-CM | POA: Diagnosis not present

## 2015-10-14 DIAGNOSIS — Z8719 Personal history of other diseases of the digestive system: Secondary | ICD-10-CM | POA: Diagnosis not present

## 2015-10-14 DIAGNOSIS — R140 Abdominal distension (gaseous): Secondary | ICD-10-CM

## 2015-10-14 DIAGNOSIS — Z79899 Other long term (current) drug therapy: Secondary | ICD-10-CM | POA: Insufficient documentation

## 2015-10-14 LAB — CBC
HCT: 41.1 % (ref 36.0–46.0)
Hemoglobin: 14.2 g/dL (ref 12.0–15.0)
MCH: 30.9 pg (ref 26.0–34.0)
MCHC: 34.5 g/dL (ref 30.0–36.0)
MCV: 89.5 fL (ref 78.0–100.0)
PLATELETS: 233 10*3/uL (ref 150–400)
RBC: 4.59 MIL/uL (ref 3.87–5.11)
RDW: 12.4 % (ref 11.5–15.5)
WBC: 7.6 10*3/uL (ref 4.0–10.5)

## 2015-10-14 LAB — COMPREHENSIVE METABOLIC PANEL
ALT: 44 U/L (ref 14–54)
ANION GAP: 10 (ref 5–15)
AST: 57 U/L — ABNORMAL HIGH (ref 15–41)
Albumin: 3.9 g/dL (ref 3.5–5.0)
Alkaline Phosphatase: 86 U/L (ref 38–126)
BUN: 12 mg/dL (ref 6–20)
CALCIUM: 9.5 mg/dL (ref 8.9–10.3)
CHLORIDE: 105 mmol/L (ref 101–111)
CO2: 23 mmol/L (ref 22–32)
Creatinine, Ser: 0.66 mg/dL (ref 0.44–1.00)
Glucose, Bld: 141 mg/dL — ABNORMAL HIGH (ref 65–99)
Potassium: 4.3 mmol/L (ref 3.5–5.1)
SODIUM: 138 mmol/L (ref 135–145)
Total Bilirubin: 0.7 mg/dL (ref 0.3–1.2)
Total Protein: 7.1 g/dL (ref 6.5–8.1)

## 2015-10-14 LAB — LIPASE, BLOOD: LIPASE: 32 U/L (ref 11–51)

## 2015-10-14 LAB — TROPONIN I

## 2015-10-14 MED ORDER — PANTOPRAZOLE SODIUM 40 MG PO TBEC
40.0000 mg | DELAYED_RELEASE_TABLET | Freq: Once | ORAL | Status: AC
  Administered 2015-10-14: 40 mg via ORAL
  Filled 2015-10-14: qty 1

## 2015-10-14 MED ORDER — PANTOPRAZOLE SODIUM 20 MG PO TBEC: 20.0000 mg | DELAYED_RELEASE_TABLET | Freq: Every day | ORAL | Status: DC

## 2015-10-14 MED ORDER — GI COCKTAIL ~~LOC~~
30.0000 mL | Freq: Once | ORAL | Status: AC
  Administered 2015-10-14: 30 mL via ORAL
  Filled 2015-10-14: qty 30

## 2015-10-14 NOTE — Telephone Encounter (Signed)
Patient Name: Faith Orr DOB: [birth year] Initial Comment Caller states feels like something sitting on her chest Nurse Assessment Nurse: Vallery Sa, RN, [place] Date/Time (Eastern Time): 10/14/2015 11:22:57 AM Confirm and document reason for call. If symptomatic, describe symptoms. ---Caller states she developed chest pressure about 3 days ago. No severe breathing difficulty. No injury in the past 3 days. No fever. Has the patient traveled out of the country within the last 30 days? ---No Does the patient have any new or worsening symptoms? ---Yes Will a triage be completed? ---Yes Related visit to physician within the last 2 weeks? ---Yes Does the PT have any chronic conditions? (i.e. diabetes, asthma, etc.) ---Yes List chronic conditions. ---High Cholesterol, GERD Guidelines Guideline Title Affirmed Question Affirmed Notes Chest Pain [1] Chest pain lasts > 5 minutes AND [2] described as crushing, pressure-like, or heavy Pressure-like Final Disposition User Call EMS 911 Now Vallery Sa, RN, [place] Referrals She plans to have someone drive her to [facility] - ED Disagree/Comply: Disagree Disagree/Comply Reason: Disagree with instructions

## 2015-10-14 NOTE — ED Provider Notes (Signed)
CSN: [id number]     Arrival date & time 10/14/15  1301 History   First MD Initiated Contact with Patient 10/14/15 1332     Chief Complaint  Patient presents with  . Chest Pain     (Consider location/radiation/quality/duration/timing/severity/associated sxs/prior Treatment) HPI Patient has been experiencing symptoms of epigastric and chest fullness for almost a month. The symptoms are not painful but she feels that she always has to belch. She noticed to be more uncomfortable over the past 3 days. Patient has not had associated fever or cough. There's been no sputum production. She reports yesterday she was getting what felt like hot flashes and sweating. There has occasionally been nausea associated with this as well. Her partners noting that she seems to be more short of breath although the patient herself had not really noticed it. Patient does report that certain foods can just make her feel very bloated and full. Patient has been taking Zantac as needed but not on a regular basis. Past Medical History  Diagnosis Date  . Obesity, unspecified   . OSTEOPENIA   . URINARY INCONTINENCE   . DEPRESSION   . DIABETES MELLITUS, TYPE II   . HYPERLIPIDEMIA   . Diverticulosis   . Asthma   . Allergy     seasonal  . Macular degeneration of left eye     mild, Dr.Hecker  . Osteopenia    Past Surgical History  Procedure Laterality Date  . Cesarean section  01/1973  . Right ankle  1994  . Left wrist surgery  2008    By Dr. Latanya Maudlin  . Breast surgery    . Fracture surgery     Family History  Problem Relation Age of Onset  . Diabetes Father   . Hyperlipidemia Father   . Heart disease Father   . Cancer Father   . Hypertension Father   . Multiple sclerosis Daughter   . Cancer      bladder  . Colon cancer Paternal Grandmother   . Osteoporosis Mother   . Protein S deficiency Mother   . Hyperlipidemia Mother    Social History  Substance Use Topics  . Smoking status: Never Smoker   .  Smokeless tobacco: Never Used     Comment: Lives with partner [name]) and son  . Alcohol Use: No   OB History    Gravida Para Term Preterm AB TAB SAB Ectopic Multiple Living   '1 1        1     ' Review of Systems  10 Systems reviewed and are negative for acute change except as noted in the HPI.   Allergies  Penicillins; Aleve; and Sulfonamide derivatives  Home Medications   Prior to Admission medications   Medication Sig Start Date End Date Taking? Authorizing Provider  albuterol (PROAIR HFA) 108 (90 BASE) MCG/ACT inhaler Inhale 1-2 puffs into the lungs every 6 (six) hours as needed for wheezing or shortness of breath. 12/03/14   Rowe Clack, MD  Blood Glucose Monitoring Suppl (FREESTYLE FREEDOM LITE) W/DEVICE KIT Use to check blood sugars twice a day Dx 250.00 06/01/14   Rowe Clack, MD  Calcium Carbonate-Vitamin D (CALCIUM 600+D HIGH POTENCY) 600-400 MG-UNIT per tablet Take 1 tablet by mouth 2 (two) times daily.     Historical Provider, MD  fluticasone (FLONASE) 50 MCG/ACT nasal spray Place 1 spray into both nostrils daily. Patient not taking: Reported on 08/30/2015 12/03/14   Rowe Clack, MD  glucose blood (FREESTYLE  LITE) test strip CHECK BLOOD SUGAR TWICE DAILY AS DIRECTED Dx 250.00 07/13/14   Rowe Clack, MD  ibuprofen (ADVIL,MOTRIN) 200 MG tablet Take 200 mg by mouth as needed.      Historical Provider, MD  ivermectin (STROMECTOL) 3 MG TABS tablet Take 4 tablets (12 mg total) by mouth once. 08/30/15   Robyn Haber, MD  Lancets (FREESTYLE) lancets 1 each by Other route 2 (two) times daily. Use as instructed     Historical Provider, MD  metFORMIN (GLUCOPHAGE-XR) 500 MG 24 hr tablet TAKE 1 TABLET BY MOUTH TWICE DAILY 02/12/15   Rowe Clack, MD  METROGEL 1 % gel Apply 1 application topically daily.  05/24/12   Historical Provider, MD  Multiple Vitamin (MULTIVITAMIN) tablet Take 1 tablet by mouth daily.      Historical Provider, MD  Multiple  Vitamins-Minerals (ICAPS) CAPS Take 1 capsule by mouth daily.      Historical Provider, MD  predniSONE (DELTASONE) 20 MG tablet Take 1 tablet (20 mg total) by mouth daily with breakfast. 08/30/15   Robyn Haber, MD  sertraline (ZOLOFT) 50 MG tablet Take 1 tablet (50 mg total) by mouth daily. 07/08/15   Hoyt Koch, MD  simvastatin (ZOCOR) 20 MG tablet Take 1 tablet (20 mg total) by mouth daily. 07/08/15   Hoyt Koch, MD   BP 119/66 mmHg  Pulse 75  Temp(Src) 98.4 F (36.9 C) (Oral)  Resp 18  Ht '4\' 11"'  (1.499 m)  Wt 192 lb (87.091 kg)  BMI 38.76 kg/m2  SpO2 96%  LMP 10/09/2012 Physical Exam  Constitutional: She is oriented to person, place, and time. She appears well-developed and well-nourished.  HENT:  Head: Normocephalic and atraumatic.  Eyes: EOM are normal. Pupils are equal, round, and reactive to light.  Neck: Neck supple.  Cardiovascular: Normal rate, regular rhythm, normal heart sounds and intact distal pulses.   Pulmonary/Chest: Effort normal and breath sounds normal.  Abdominal: Soft. Bowel sounds are normal. She exhibits no distension. There is tenderness.  Mild epigastric tenderness to palpation. No guarding.  Musculoskeletal: Normal range of motion. She exhibits no edema.  Neurological: She is alert and oriented to person, place, and time. She has normal strength. Coordination normal. GCS eye subscore is 4. GCS verbal subscore is 5. GCS motor subscore is 6.  Skin: Skin is warm, dry and intact.  Psychiatric: She has a normal mood and affect.    ED Course  Procedures (including critical care time) Labs Review Labs Reviewed  COMPREHENSIVE METABOLIC PANEL - Abnormal; Notable for the following:    Glucose, Bld 141 (*)    AST 57 (*)    All other components within normal limits  CBC  TROPONIN I  LIPASE, BLOOD    Imaging Review Dg Chest 2 View  10/14/2015  CLINICAL DATA:  Chest tightness. EXAM: CHEST  2 VIEW COMPARISON:  05/23/2011. FINDINGS:  Mediastinum and hilar structures normal. Left mid lung field subsegmental atelectasis and/or scarring noted. No pleural effusion or pneumothorax. Heart size normal. Degenerative changes thoracic spine. IMPRESSION: Left mid lung field subsegmental atelectasis and/or scarring. Electronically Signed   By: Marcello Moores  Register   On: 10/14/2015 13:47   US Abdomen Limited Ruq  10/14/2015  CLINICAL DATA:  Abdominal bloating for several years EXAM: US ABDOMEN LIMITED - RIGHT UPPER QUADRANT COMPARISON:  None. FINDINGS: Gallbladder: No gallstones or wall thickening visualized. No sonographic Murphy sign noted. Common bile duct: Diameter: 4 mm Liver: No focal lesion identified. Increased hepatic parenchymal  echogenicity. IMPRESSION: 1. Increased hepatic echogenicity as can be seen with hepatic steatosis. Electronically Signed   By: Kathreen Devoid   On: 10/14/2015 14:48   I have personally reviewed and evaluated these images and lab results as part of my medical decision-making.   EKG Interpretation   Date/Time:  Monday October 14 2015 13:13:11 EDT Ventricular Rate:  84 PR Interval:  174 QRS Duration: 68 QT Interval:  348 QTC Calculation: 411 R Axis:   -51 Text Interpretation:  Normal sinus rhythm Confirmed by Johnney Killian, MD, Jeannie Done  [phone]) on 10/14/2015 3:43:33 PM      MDM   Final diagnoses:  Abdominal bloating   Patient presents with prolonged symptoms of upper abdominal and chest feeling of fullness and bloating. At this time symptoms to not sound highly suggestive of ischemic cardiac disease. The patient is advised her to follow-up with her family physician to schedule stress testing. She will be initiated on a PPI while awaiting follow-up and further testing as needed. Workup in the emergency department this time is negative for acute findings.    Charlesetta Shanks, MD 10/24/15 [phone]

## 2015-10-14 NOTE — Discharge Instructions (Signed)
Nonspecific Chest Pain  Chest pain can be caused by many different conditions. There is always a chance that your pain could be related to something serious, such as a heart attack or a blood clot in your lungs. Chest pain can also be caused by conditions that are not life-threatening. If you have chest pain, it is very important to follow up with your health care provider. CAUSES  Chest pain can be caused by:  Heartburn.  Pneumonia or bronchitis.  Anxiety or stress.  Inflammation around your heart (pericarditis) or lung (pleuritis or pleurisy).  A blood clot in your lung.  A collapsed lung (pneumothorax). It can develop suddenly on its own (spontaneous pneumothorax) or from trauma to the chest.  Shingles infection (varicella-zoster virus).  Heart attack.  Damage to the bones, muscles, and cartilage that make up your chest wall. This can include:  Bruised bones due to injury.  Strained muscles or cartilage due to frequent or repeated coughing or overwork.  Fracture to one or more ribs.  Sore cartilage due to inflammation (costochondritis). RISK FACTORS  Risk factors for chest pain may include:  Activities that increase your risk for trauma or injury to your chest.  Respiratory infections or conditions that cause frequent coughing.  Medical conditions or overeating that can cause heartburn.  Heart disease or family history of heart disease.  Conditions or health behaviors that increase your risk of developing a blood clot.  Having had chicken pox (varicella zoster). SIGNS AND SYMPTOMS Chest pain can feel like:  Burning or tingling on the surface of your chest or deep in your chest.  Crushing, pressure, aching, or squeezing pain.  Dull or sharp pain that is worse when you move, cough, or take a deep breath.  Pain that is also felt in your back, neck, shoulder, or arm, or pain that spreads to any of these areas. Your chest pain may come and go, or it may stay  constant. DIAGNOSIS Lab tests or other studies may be needed to find the cause of your pain. Your health care provider may have you take a test called an ambulatory ECG (electrocardiogram). An ECG records your heartbeat patterns at the time the test is performed. You may also have other tests, such as:  Transthoracic echocardiogram (TTE). During echocardiography, sound waves are used to create a picture of all of the heart structures and to look at how blood flows through your heart.  Transesophageal echocardiogram (TEE).This is a more advanced imaging test that obtains images from inside your body. It allows your health care provider to see your heart in finer detail.  Cardiac monitoring. This allows your health care provider to monitor your heart rate and rhythm in real time.  Holter monitor. This is a portable device that records your heartbeat and can help to diagnose abnormal heartbeats. It allows your health care provider to track your heart activity for several days, if needed.  Stress tests. These can be done through exercise or by taking medicine that makes your heart beat more quickly.  Blood tests.  Imaging tests. TREATMENT  Your treatment depends on what is causing your chest pain. Treatment may include:  Medicines. These may include:  Acid blockers for heartburn.  Anti-inflammatory medicine.  Pain medicine for inflammatory conditions.  Antibiotic medicine, if an infection is present.  Medicines to dissolve blood clots.  Medicines to treat coronary artery disease.  Supportive care for conditions that do not require medicines. This may include:  Resting.  Applying heat  or cold packs to injured areas.  Limiting activities until pain decreases. HOME CARE INSTRUCTIONS  If you were prescribed an antibiotic medicine, finish it all even if you start to feel better.  Avoid any activities that bring on chest pain.  Do not use any tobacco products, including  cigarettes, chewing tobacco, or electronic cigarettes. If you need help quitting, ask your health care provider.  Do not drink alcohol.  Take medicines only as directed by your health care provider.  Keep all follow-up visits as directed by your health care provider. This is important. This includes any further testing if your chest pain does not go away.  If heartburn is the cause for your chest pain, you may be told to keep your head raised (elevated) while sleeping. This reduces the chance that acid will go from your stomach into your esophagus.  Make lifestyle changes as directed by your health care provider. These may include:  Getting regular exercise. Ask your health care provider to suggest some activities that are safe for you.  Eating a heart-healthy diet. A registered dietitian can help you to learn healthy eating options.  Maintaining a healthy weight.  Managing diabetes, if necessary.  Reducing stress. SEEK MEDICAL CARE IF:  Your chest pain does not go away after treatment.  You have a rash with blisters on your chest.  You have a fever. SEEK IMMEDIATE MEDICAL CARE IF:   Your chest pain is worse.  You have an increasing cough, or you cough up blood.  You have severe abdominal pain.  You have severe weakness.  You faint.  You have chills.  You have sudden, unexplained chest discomfort.  You have sudden, unexplained discomfort in your arms, back, neck, or jaw.  You have shortness of breath at any time.  You suddenly start to sweat, or your skin gets clammy.  You feel nauseous or you vomit.  You suddenly feel light-headed or dizzy.  Your heart begins to beat quickly, or it feels like it is skipping beats. These symptoms may represent a serious problem that is an emergency. Do not wait to see if the symptoms will go away. Get medical help right away. Call your local emergency services (911 in the [place] Do not drive yourself to the hospital.   This  information is not intended to replace advice given to you by your health care provider. Make sure you discuss any questions you have with your health care provider.   Document Released: 09/09/2005 Document Revised: 12/21/2014 Document Reviewed: 07/06/2014 Elsevier Interactive Patient Education 2016 [place] for Gastroesophageal Reflux Disease, Adult When you have gastroesophageal reflux disease (GERD), the foods you eat and your eating habits are very important. Choosing the right foods can help ease the discomfort of GERD. WHAT GENERAL GUIDELINES DO I NEED TO FOLLOW?  Choose fruits, vegetables, whole grains, low-fat dairy products, and low-fat meat, fish, and poultry.  Limit fats such as oils, salad dressings, butter, nuts, and avocado.  Keep a food diary to identify foods that cause symptoms.  Avoid foods that cause reflux. These may be different for different people.  Eat frequent small meals instead of three large meals each day.  Eat your meals slowly, in a relaxed setting.  Limit fried foods.  Cook foods using methods other than frying.  Avoid drinking alcohol.  Avoid drinking large amounts of liquids with your meals.  Avoid bending over or lying down until 2-3 hours after eating. WHAT FOODS ARE NOT RECOMMENDED? The  following are some foods and drinks that may worsen your symptoms: Vegetables Tomatoes. Tomato juice. Tomato and spaghetti sauce. Chili peppers. Onion and garlic. Horseradish. Fruits Oranges, grapefruit, and lemon (fruit and juice). Meats High-fat meats, fish, and poultry. This includes hot dogs, ribs, ham, sausage, salami, and bacon. Dairy Whole milk and chocolate milk. Sour cream. Cream. Butter. Ice cream. Cream cheese.  Beverages Coffee and tea, with or without caffeine. Carbonated beverages or energy drinks. Condiments Hot sauce. Barbecue sauce.  Sweets/Desserts Chocolate and cocoa. Donuts. Peppermint and spearmint. Fats and  Oils High-fat foods, including [place] fries and potato chips. Other Vinegar. Strong spices, such as black pepper, white pepper, red pepper, cayenne, curry powder, cloves, ginger, and chili powder. The items listed above may not be a complete list of foods and beverages to avoid. Contact your dietitian for more information.   This information is not intended to replace advice given to you by your health care provider. Make sure you discuss any questions you have with your health care provider.   Document Released: 11/30/2005 Document Revised: 12/21/2014 Document Reviewed: 10/04/2013 Elsevier Interactive Patient Education 2016 [employer] Inc[employer] Suspected Gastroesophageal Reflux Disease, Adult Normally, food travels down the esophagus and stays in the stomach to be digested. However, when a person has gastroesophageal reflux disease (GERD), food and stomach acid move back up into the esophagus. When this happens, the esophagus becomes sore and inflamed. Over time, GERD can create small holes (ulcers) in the lining of the esophagus.  CAUSES This condition is caused by a problem with the muscle between the esophagus and the stomach (lower esophageal sphincter, or LES). Normally, the LES muscle closes after food passes through the esophagus to the stomach. When the LES is weakened or abnormal, it does not close properly, and that allows food and stomach acid to go back up into the esophagus. The LES can be weakened by certain dietary substances, medicines, and medical conditions, including:  Tobacco use.  Pregnancy.  Having a hiatal hernia.  Heavy alcohol use.  Certain foods and beverages, such as coffee, chocolate, onions, and peppermint. RISK FACTORS This condition is more likely to develop in:  People who have an increased body weight.  People who have connective tissue disorders.  People who use NSAID medicines. SYMPTOMS Symptoms of this condition include:  Heartburn.  Difficult or  painful swallowing.  The feeling of having a lump in the throat.  Abitter taste in the mouth.  Bad breath.  Having a large amount of saliva.  Having an upset or bloated stomach.  Belching.  Chest pain.  Shortness of breath or wheezing.  Ongoing (chronic) cough or a night-time cough.  Wearing away of tooth enamel.  Weight loss. Different conditions can cause chest pain. Make sure to see your health care provider if you experience chest pain. DIAGNOSIS Your health care provider will take a medical history and perform a physical exam. To determine if you have mild or severe GERD, your health care provider may also monitor how you respond to treatment. You may also have other tests, including:  An endoscopy toexamine your stomach and esophagus with a small camera.  A test thatmeasures the acidity level in your esophagus.  A test thatmeasures how much pressure is on your esophagus.  A barium swallow or modified barium swallow to show the shape, size, and functioning of your esophagus. TREATMENT The goal of treatment is to help relieve your symptoms and to prevent complications. Treatment for this condition may vary depending on  how severe your symptoms are. Your health care provider may recommend:  Changes to your diet.  Medicine.  Surgery. HOME CARE INSTRUCTIONS Diet  Follow a diet as recommended by your health care provider. This may involve avoiding foods and drinks such as:  Coffee and tea (with or without caffeine).  Drinks that containalcohol.  Energy drinks and sports drinks.  Carbonated drinks or sodas.  Chocolate and cocoa.  Peppermint and mint flavorings.  Garlic and onions.  Horseradish.  Spicy and acidic foods, including peppers, chili powder, curry powder, vinegar, hot sauces, and barbecue sauce.  Citrus fruit juices and citrus fruits, such as oranges, lemons, and limes.  Tomato-based foods, such as red sauce, chili, salsa, and pizza  with red sauce.  Fried and fatty foods, such as donuts, french fries, potato chips, and high-fat dressings.  High-fat meats, such as hot dogs and fatty cuts of red and white meats, such as rib eye steak, sausage, ham, and bacon.  High-fat dairy items, such as whole milk, butter, and cream cheese.  Eat small, frequent meals instead of large meals.  Avoid drinking large amounts of liquid with your meals.  Avoid eating meals during the 2-3 hours before bedtime.  Avoid lying down right after you eat.  Do not exercise right after you eat. General Instructions  Pay attention to any changes in your symptoms.  Take over-the-counter and prescription medicines only as told by your health care provider. Do not take aspirin, ibuprofen, or other NSAIDs unless your health care provider told you to do so.  Do not use any tobacco products, including cigarettes, chewing tobacco, and e-cigarettes. If you need help quitting, ask your health care provider.  Wear loose-fitting clothing. Do not wear anything tight around your waist that causes pressure on your abdomen.  Raise (elevate) the head of your bed 6 inches (15cm).  Try to reduce your stress, such as with yoga or meditation. If you need help reducing stress, ask your health care provider.  If you are overweight, reduce your weight to an amount that is healthy for you. Ask your health care provider for guidance about a safe weight loss goal.  Keep all follow-up visits as told by your health care provider. This is important. SEEK MEDICAL CARE IF:  You have new symptoms.  You have unexplained weight loss.  You have difficulty swallowing, or it hurts to swallow.  You have wheezing or a persistent cough.  Your symptoms do not improve with treatment.  You have a hoarse voice. SEEK IMMEDIATE MEDICAL CARE IF:  You have pain in your arms, neck, jaw, teeth, or back.  You feel sweaty, dizzy, or light-headed.  You have chest pain or  shortness of breath.  You vomit and your vomit looks like blood or coffee grounds.  You faint.  Your stool is bloody or black.  You cannot swallow, drink, or eat.   This information is not intended to replace advice given to you by your health care provider. Make sure you discuss any questions you have with your health care provider.   Document Released: 09/09/2005 Document Revised: 08/21/2015 Document Reviewed: 03/27/2015 Elsevier Interactive Patient Education [employer].

## 2015-10-14 NOTE — ED Notes (Signed)
CP x 1 month-worse x 3 days

## 2015-10-15 ENCOUNTER — Ambulatory Visit (INDEPENDENT_AMBULATORY_CARE_PROVIDER_SITE_OTHER): Payer: Medicare Other | Admitting: Internal Medicine

## 2015-10-15 ENCOUNTER — Encounter: Payer: Self-pay | Admitting: Internal Medicine

## 2015-10-15 VITALS — BP 138/78 | HR 102 | Temp 97.7°F | Resp 18 | Ht 59.0 in | Wt 194.0 lb

## 2015-10-15 DIAGNOSIS — R0789 Other chest pain: Secondary | ICD-10-CM

## 2015-10-15 NOTE — Patient Instructions (Signed)
We have ordered your stress test and will get it scheduled.   Start taking the pantoprazole daily to see if it helps.

## 2015-10-15 NOTE — Progress Notes (Signed)
Pre visit review using our clinic review tool, if applicable. No additional management support is needed unless otherwise documented below in the visit note. 

## 2015-10-16 ENCOUNTER — Encounter: Payer: Self-pay | Admitting: Internal Medicine

## 2015-10-16 DIAGNOSIS — R0789 Other chest pain: Secondary | ICD-10-CM | POA: Insufficient documentation

## 2015-10-16 NOTE — Assessment & Plan Note (Signed)
Concerning for cardiac given the nature. Will arrange stat stress test for this week. If any changes needs urgent cardiology referral and likely cath. Advised to start taking the protonix to see if the symptoms are related to GERD but still needs stress test.

## 2015-10-16 NOTE — Progress Notes (Signed)
   Subjective:    Patient ID: Faith Orr, female    DOB: 08/06/1941, 74 y.o.   MRN: [id number]  HPI The patient is a 74 YO female coming in for chest discomfort. She describes it as a squeezing sensation and like something is sitting on her chest. Went to the ER yesterday and EKG without changes and troponin negative. Strong family history of heart disease and several deaths in her father's side from heart disease. She does have diabetes, hypertension, hyperlipidemia. Not taking a daily aspirin. She was given protonix for the pain and has not started it yet. Has struggled with acid reflux over the years and this does not feel similar. Feeling worse with exertion and better with rest.   Review of Systems  Constitutional: Positive for activity change and fatigue. Negative for fever, chills, diaphoresis, appetite change and unexpected weight change.  HENT: Negative.   Respiratory: Positive for chest tightness. Negative for cough, shortness of breath and wheezing.   Cardiovascular: Positive for chest pain. Negative for palpitations and leg swelling.       Tightness  Gastrointestinal: Negative for nausea, vomiting, abdominal pain, diarrhea, constipation and abdominal distention.  Musculoskeletal: Negative.   Skin: Negative.   Neurological: Negative.   Psychiatric/Behavioral: Negative.       Objective:   Physical Exam  Constitutional: She is oriented to person, place, and time. She appears well-developed and well-nourished.  HENT:  Head: Normocephalic and atraumatic.  Eyes: EOM are normal.  Neck: Normal range of motion.  Cardiovascular: Normal rate and regular rhythm.   Pulmonary/Chest: Effort normal. No respiratory distress. She has no wheezes. She exhibits no tenderness.  Abdominal: Soft.  Musculoskeletal: She exhibits no edema.  Neurological: She is alert and oriented to person, place, and time.  Skin: Skin is warm and dry.  Psychiatric: She has a normal mood and affect.   Filed  Vitals:   10/15/15 1543  BP: 138/78  Pulse: 102  Temp: 97.7 F (36.5 C)  TempSrc: Oral  Resp: 18  Height: 4\' 11"  (1.499 m)  Weight: 194 lb (87.998 kg)  SpO2: 98%      Assessment & Plan:

## 2015-10-23 ENCOUNTER — Other Ambulatory Visit (INDEPENDENT_AMBULATORY_CARE_PROVIDER_SITE_OTHER): Payer: Medicare Other

## 2015-10-23 ENCOUNTER — Ambulatory Visit (INDEPENDENT_AMBULATORY_CARE_PROVIDER_SITE_OTHER): Payer: Medicare Other | Admitting: Internal Medicine

## 2015-10-23 ENCOUNTER — Encounter: Payer: Self-pay | Admitting: Internal Medicine

## 2015-10-23 VITALS — BP 118/80 | HR 93 | Temp 97.9°F | Wt 194.0 lb

## 2015-10-23 DIAGNOSIS — E119 Type 2 diabetes mellitus without complications: Secondary | ICD-10-CM | POA: Diagnosis not present

## 2015-10-23 DIAGNOSIS — Z0000 Encounter for general adult medical examination without abnormal findings: Secondary | ICD-10-CM | POA: Diagnosis not present

## 2015-10-23 LAB — MICROALBUMIN / CREATININE URINE RATIO
Creatinine,U: 131.3 mg/dL
Microalb Creat Ratio: 0.6 mg/g (ref 0.0–30.0)
Microalb, Ur: 0.8 mg/dL (ref 0.0–1.9)

## 2015-10-23 LAB — LDL CHOLESTEROL, DIRECT: Direct LDL: 116 mg/dL

## 2015-10-23 LAB — LIPID PANEL
CHOL/HDL RATIO: 5
Cholesterol: 204 mg/dL — ABNORMAL HIGH (ref 0–200)
HDL: 44.7 mg/dL (ref >39.00)
NONHDL: 158.8
TRIGLYCERIDES: 281 mg/dL — AB (ref 0.0–149.0)
VLDL: 56.2 mg/dL — ABNORMAL HIGH (ref 0.0–40.0)

## 2015-10-23 LAB — HEMOGLOBIN A1C: Hgb A1c MFr Bld: 7.1 % — ABNORMAL HIGH (ref 4.6–6.5)

## 2015-10-23 NOTE — Patient Instructions (Signed)
We will call you about the results from the stress test.   We are checking some blood work today and the urine for signs of diabetes affecting the kidneys and will call you back about those results as well.   Health Maintenance, Female Adopting a healthy lifestyle and getting preventive care can go a long way to promote health and wellness. Talk with your health care provider about what schedule of regular examinations is right for you. This is a good chance for you to check in with your provider about disease prevention and staying healthy. In between checkups, there are plenty of things you can do on your own. Experts have done a lot of research about which lifestyle changes and preventive measures are most likely to keep you healthy. Ask your health care provider for more information. WEIGHT AND DIET  Eat a healthy diet  Be sure to include plenty of vegetables, fruits, low-fat dairy products, and lean protein.  Do not eat a lot of foods high in solid fats, added sugars, or salt.  Get regular exercise. This is one of the most important things you can do for your health.  Most adults should exercise for at least 150 minutes each week. The exercise should increase your heart rate and make you sweat (moderate-intensity exercise).  Most adults should also do strengthening exercises at least twice a week. This is in addition to the moderate-intensity exercise.  Maintain a healthy weight  Body mass index (BMI) is a measurement that can be used to identify possible weight problems. It estimates body fat based on height and weight. Your health care provider can help determine your BMI and help you achieve or maintain a healthy weight.  For females 35 years of age and older:   A BMI below 18.5 is considered underweight.  A BMI of 18.5 to 24.9 is normal.  A BMI of 25 to 29.9 is considered overweight.  A BMI of 30 and above is considered obese.  Watch levels of cholesterol and blood  lipids  You should start having your blood tested for lipids and cholesterol at 72 years of age, then have this test every 5 years.  You may need to have your cholesterol levels checked more often if:  Your lipid or cholesterol levels are high.  You are older than 72 years of age.  You are at high risk for heart disease.  CANCER SCREENING   Lung Cancer  Lung cancer screening is recommended for adults 52-34 years old who are at high risk for lung cancer because of a history of smoking.  A yearly low-dose CT scan of the lungs is recommended for people who:  Currently smoke.  Have quit within the past 15 years.  Have at least a 30-pack-year history of smoking. A pack year is smoking an average of one pack of cigarettes a day for 1 year.  Yearly screening should continue until it has been 15 years since you quit.  Yearly screening should stop if you develop a health problem that would prevent you from having lung cancer treatment.  Breast Cancer  Practice breast self-awareness. This means understanding how your breasts normally appear and feel.  It also means doing regular breast self-exams. Let your health care provider know about any changes, no matter how small.  If you are in your 20s or 30s, you should have a clinical breast exam (CBE) by a health care provider every 1-3 years as part of a regular health exam.  If you are 40 or older, have a CBE every year. Also consider having a breast X-ray (mammogram) every year.  If you have a family history of breast cancer, talk to your health care provider about genetic screening.  If you are at high risk for breast cancer, talk to your health care provider about having an MRI and a mammogram every year.  Breast cancer gene (BRCA) assessment is recommended for women who have family members with BRCA-related cancers. BRCA-related cancers include:  Breast.  Ovarian.  Tubal.  Peritoneal cancers.  Results of the assessment  will determine the need for genetic counseling and BRCA1 and BRCA2 testing. Cervical Cancer Your health care provider may recommend that you be screened regularly for cancer of the pelvic organs (ovaries, uterus, and vagina). This screening involves a pelvic examination, including checking for microscopic changes to the surface of your cervix (Pap test). You may be encouraged to have this screening done every 3 years, beginning at age 24.  For women ages 59-65, health care providers may recommend pelvic exams and Pap testing every 3 years, or they may recommend the Pap and pelvic exam, combined with testing for human papilloma virus (HPV), every 5 years. Some types of HPV increase your risk of cervical cancer. Testing for HPV may also be done on women of any age with unclear Pap test results.  Other health care providers may not recommend any screening for nonpregnant women who are considered low risk for pelvic cancer and who do not have symptoms. Ask your health care provider if a screening pelvic exam is right for you.  If you have had past treatment for cervical cancer or a condition that could lead to cancer, you need Pap tests and screening for cancer for at least 20 years after your treatment. If Pap tests have been discontinued, your risk factors (such as having a new sexual partner) need to be reassessed to determine if screening should resume. Some women have medical problems that increase the chance of getting cervical cancer. In these cases, your health care provider may recommend more frequent screening and Pap tests. Colorectal Cancer  This type of cancer can be detected and often prevented.  Routine colorectal cancer screening usually begins at 72 years of age and continues through 72 years of age.  Your health care provider may recommend screening at an earlier age if you have risk factors for colon cancer.  Your health care provider may also recommend using home test kits to check  for hidden blood in the stool.  A small camera at the end of a tube can be used to examine your colon directly (sigmoidoscopy or colonoscopy). This is done to check for the earliest forms of colorectal cancer.  Routine screening usually begins at age 26.  Direct examination of the colon should be repeated every 5-10 years through 72 years of age. However, you may need to be screened more often if early forms of precancerous polyps or small growths are found. Skin Cancer  Check your skin from head to toe regularly.  Tell your health care provider about any new moles or changes in moles, especially if there is a change in a mole's shape or color.  Also tell your health care provider if you have a mole that is larger than the size of a pencil eraser.  Always use sunscreen. Apply sunscreen liberally and repeatedly throughout the day.  Protect yourself by wearing long sleeves, pants, a wide-brimmed hat, and sunglasses whenever you  are outside. HEART DISEASE, DIABETES, AND HIGH BLOOD PRESSURE   High blood pressure causes heart disease and increases the risk of stroke. High blood pressure is more likely to develop in:  People who have blood pressure in the high end of the normal range (130-139/85-89 mm Hg).  People who are overweight or obese.  People who are African American.  If you are 18-39 years of age, have your blood pressure checked every 3-5 years. If you are 40 years of age or older, have your blood pressure checked every year. You should have your blood pressure measured twice--once when you are at a hospital or clinic, and once when you are not at a hospital or clinic. Record the average of the two measurements. To check your blood pressure when you are not at a hospital or clinic, you can use:  An automated blood pressure machine at a pharmacy.  A home blood pressure monitor.  If you are between 55 years and 79 years old, ask your health care provider if you should take  aspirin to prevent strokes.  Have regular diabetes screenings. This involves taking a blood sample to check your fasting blood sugar level.  If you are at a normal weight and have a low risk for diabetes, have this test once every three years after 72 years of age.  If you are overweight and have a high risk for diabetes, consider being tested at a younger age or more often. PREVENTING INFECTION  Hepatitis B  If you have a higher risk for hepatitis B, you should be screened for this virus. You are considered at high risk for hepatitis B if:  You were born in a country where hepatitis B is common. Ask your health care provider which countries are considered high risk.  Your parents were born in a high-risk country, and you have not been immunized against hepatitis B (hepatitis B vaccine).  You have HIV or AIDS.  You use needles to inject street drugs.  You live with someone who has hepatitis B.  You have had sex with someone who has hepatitis B.  You get hemodialysis treatment.  You take certain medicines for conditions, including cancer, organ transplantation, and autoimmune conditions. Hepatitis C  Blood testing is recommended for:  Everyone born from 1945 through 1965.  Anyone with known risk factors for hepatitis C. Sexually transmitted infections (STIs)  You should be screened for sexually transmitted infections (STIs) including gonorrhea and chlamydia if:  You are sexually active and are younger than 72 years of age.  You are older than 72 years of age and your health care provider tells you that you are at risk for this type of infection.  Your sexual activity has changed since you were last screened and you are at an increased risk for chlamydia or gonorrhea. Ask your health care provider if you are at risk.  If you do not have HIV, but are at risk, it may be recommended that you take a prescription medicine daily to prevent HIV infection. This is called  pre-exposure prophylaxis (PrEP). You are considered at risk if:  You are sexually active and do not regularly use condoms or know the HIV status of your partner(s).  You take drugs by injection.  You are sexually active with a partner who has HIV. Talk with your health care provider about whether you are at high risk of being infected with HIV. If you choose to begin PrEP, you should first be tested for   HIV. You should then be tested every 3 months for as long as you are taking PrEP.  PREGNANCY   If you are premenopausal and you may become pregnant, ask your health care provider about preconception counseling.  If you may become pregnant, take 400 to 800 micrograms (mcg) of folic acid every day.  If you want to prevent pregnancy, talk to your health care provider about birth control (contraception). OSTEOPOROSIS AND MENOPAUSE   Osteoporosis is a disease in which the bones lose minerals and strength with aging. This can result in serious bone fractures. Your risk for osteoporosis can be identified using a bone density scan.  If you are 65 years of age or older, or if you are at risk for osteoporosis and fractures, ask your health care provider if you should be screened.  Ask your health care provider whether you should take a calcium or vitamin D supplement to lower your risk for osteoporosis.  Menopause may have certain physical symptoms and risks.  Hormone replacement therapy may reduce some of these symptoms and risks. Talk to your health care provider about whether hormone replacement therapy is right for you.  HOME CARE INSTRUCTIONS   Schedule regular health, dental, and eye exams.  Stay current with your immunizations.   Do not use any tobacco products including cigarettes, chewing tobacco, or electronic cigarettes.  If you are pregnant, do not drink alcohol.  If you are breastfeeding, limit how much and how often you drink alcohol.  Limit alcohol intake to no more than 1  drink per day for nonpregnant women. One drink equals 12 ounces of beer, 5 ounces of wine, or 1 ounces of hard liquor.  Do not use street drugs.  Do not share needles.  Ask your health care provider for help if you need support or information about quitting drugs.  Tell your health care provider if you often feel depressed.  Tell your health care provider if you have ever been abused or do not feel safe at home.   This information is not intended to replace advice given to you by your health care provider. Make sure you discuss any questions you have with your health care provider.   Document Released: 06/15/2011 Document Revised: 12/21/2014 Document Reviewed: 11/01/2013 Elsevier Interactive Patient Education 2016 [employer] Inc[employer]  

## 2015-10-23 NOTE — Progress Notes (Signed)
Pre visit review using our clinic review tool, if applicable. No additional management support is needed unless otherwise documented below in the visit note. 

## 2015-10-23 NOTE — Assessment & Plan Note (Signed)
Health maintenance up to date, getting yearly mammogram done next week. Getting stress test later this week for some chest symptoms (which have now resolved with PPI). Counseled her about the need for regular exercise for stress and to keep her heart and lungs healthy. She is working on [employer]. 10 year screening recommendations given at visit to patient.

## 2015-10-23 NOTE — Progress Notes (Signed)
   Subjective:    Patient ID: Faith Orr, female    DOB: 08/06/1941, 74 y.o.   MRN: [id number]  HPI Here for medicare wellness, no new complaints.  Diet: heart healthy  Physical activity: sedentary Depression/mood screen: negative Hearing: intact to whispered voice Visual acuity: grossly normal, performs annual eye exam  ADLs: capable Fall risk: none Home safety: good Cognitive evaluation: intact to orientation, naming, recall and repetition EOL planning: adv directives discussed and in place  I have personally reviewed and have noted 1. The patient's medical and social history - reviewed today no changes 2. Their use of alcohol, tobacco or illicit drugs 3. Their current medications and supplements 4. The patient's functional ability including ADL's, fall risks, home safety risks and hearing or visual impairment. 5. Diet and physical activities 6. Evidence for depression or mood disorders 7. Care team reviewed and updated (available in snapshot)  Review of Systems  Constitutional: Negative for fever, chills, diaphoresis, activity change, appetite change, fatigue and unexpected weight change.  HENT: Negative.   Eyes: Negative.   Respiratory: Positive for chest tightness. Negative for cough, shortness of breath and wheezing.        Rare  Cardiovascular: Negative for chest pain, palpitations and leg swelling.  Gastrointestinal: Negative for nausea, vomiting, abdominal pain, diarrhea, constipation and abdominal distention.  Musculoskeletal: Negative.   Skin: Negative.   Neurological: Negative.   Psychiatric/Behavioral: Negative.       Objective:   Physical Exam  Constitutional: She is oriented to person, place, and time. She appears well-developed and well-nourished.  HENT:  Head: Normocephalic and atraumatic.  Eyes: EOM are normal.  Neck: Normal range of motion.  Cardiovascular: Normal rate and regular rhythm.   Pulmonary/Chest: Effort normal. No respiratory distress.  She has no wheezes. She exhibits no tenderness.  Abdominal: Soft.  Musculoskeletal: She exhibits no edema.  Neurological: She is alert and oriented to person, place, and time.  Skin: Skin is warm and dry.  Psychiatric: She has a normal mood and affect.   Filed Vitals:   10/23/15 1013  BP: 118/80  Pulse: 93  Temp: 97.9 F (36.6 C)  Weight: 194 lb (87.998 kg)  SpO2: 97%      Assessment & Plan:

## 2015-10-25 ENCOUNTER — Ambulatory Visit: Payer: Medicare Other

## 2015-10-25 ENCOUNTER — Encounter: Payer: Medicare Other | Admitting: Cardiology

## 2015-10-25 ENCOUNTER — Telehealth: Payer: Self-pay | Admitting: Internal Medicine

## 2015-10-25 DIAGNOSIS — R0789 Other chest pain: Secondary | ICD-10-CM

## 2015-10-25 NOTE — Telephone Encounter (Signed)
Note given via referrals per Ivin Booty at [facility]. Pended order for PCP review. # for Ivin Booty is [phone]

## 2015-10-25 NOTE — Telephone Encounter (Signed)
Reviewed orders and signed

## 2015-10-25 NOTE — Telephone Encounter (Signed)
[facility] states that the test you will want is the Lexi Scan -  She is going to go ahead and get is scheduled and you will need to put a new order in for the Lexi Scan - Dept is Myocardial profusion imaging.

## 2015-10-29 ENCOUNTER — Ambulatory Visit
Admission: RE | Admit: 2015-10-29 | Discharge: 2015-10-29 | Disposition: A | Discharge status: Home / Self Care | Payer: Medicare Other | Source: Ambulatory Visit

## 2015-10-29 ENCOUNTER — Telehealth (HOSPITAL_COMMUNITY): Payer: Self-pay

## 2015-10-29 DIAGNOSIS — Z1231 Encounter for screening mammogram for malignant neoplasm of breast: Secondary | ICD-10-CM

## 2015-10-29 NOTE — Telephone Encounter (Signed)
Encounter complete. 

## 2015-10-30 ENCOUNTER — Telehealth (HOSPITAL_COMMUNITY): Payer: Self-pay

## 2015-10-30 NOTE — Telephone Encounter (Signed)
Encounter complete. 

## 2015-10-31 ENCOUNTER — Ambulatory Visit (HOSPITAL_COMMUNITY)
Admission: RE | Admit: 2015-10-31 | Discharge: 2015-10-31 | Disposition: A | Discharge status: Home / Self Care | Payer: Medicare Other | Source: Ambulatory Visit | Attending: Cardiology | Admitting: Cardiology

## 2015-10-31 DIAGNOSIS — R0789 Other chest pain: Secondary | ICD-10-CM

## 2015-10-31 LAB — MYOCARDIAL PERFUSION IMAGING
CHL CUP NUCLEAR SDS: 2
CHL CUP NUCLEAR SRS: 1
CHL CUP NUCLEAR SSS: 3
CHL CUP RESTING HR STRESS: 69 {beats}/min
LV sys vol: 16 mL
LVDIAVOL: 52 mL
Peak HR: 90 {beats}/min
TID: 1.25

## 2015-10-31 MED ORDER — REGADENOSON 0.4 MG/5ML IV SOLN
0.4000 mg | Freq: Once | INTRAVENOUS | Status: AC
  Administered 2015-10-31: 0.4 mg via INTRAVENOUS

## 2015-10-31 MED ORDER — TECHNETIUM TC 99M SESTAMIBI GENERIC - CARDIOLITE
10.8000 | Freq: Once | INTRAVENOUS | Status: AC | PRN
  Administered 2015-10-31: 10.8 via INTRAVENOUS

## 2015-10-31 MED ORDER — TECHNETIUM TC 99M SESTAMIBI GENERIC - CARDIOLITE
31.4000 | Freq: Once | INTRAVENOUS | Status: AC | PRN
  Administered 2015-10-31: 31.4 via INTRAVENOUS

## 2015-10-31 MED ORDER — AMINOPHYLLINE 25 MG/ML IV SOLN
75.0000 mg | Freq: Once | INTRAVENOUS | Status: AC
  Administered 2015-10-31: 75 mg via INTRAVENOUS

## 2015-11-04 ENCOUNTER — Encounter: Payer: Self-pay | Admitting: Internal Medicine

## 2015-11-05 ENCOUNTER — Other Ambulatory Visit: Payer: Self-pay | Admitting: Geriatric Medicine

## 2015-11-05 MED ORDER — PANTOPRAZOLE SODIUM 20 MG PO TBEC
20.0000 mg | DELAYED_RELEASE_TABLET | Freq: Every day | ORAL | Status: DC
Start: 2015-11-05 — End: 2017-01-03

## 2015-12-19 ENCOUNTER — Ambulatory Visit: Payer: Medicare Other | Admitting: Internal Medicine

## 2016-01-18 ENCOUNTER — Other Ambulatory Visit: Payer: Self-pay | Admitting: Internal Medicine

## 2016-03-26 LAB — HM DIABETES EYE EXAM

## 2016-04-15 ENCOUNTER — Other Ambulatory Visit (INDEPENDENT_AMBULATORY_CARE_PROVIDER_SITE_OTHER): Payer: Medicare Other

## 2016-04-15 ENCOUNTER — Ambulatory Visit (INDEPENDENT_AMBULATORY_CARE_PROVIDER_SITE_OTHER): Payer: Medicare Other | Admitting: Internal Medicine

## 2016-04-15 ENCOUNTER — Encounter: Payer: Self-pay | Admitting: Internal Medicine

## 2016-04-15 VITALS — BP 132/72 | HR 78 | Temp 98.2°F | Resp 16 | Ht 59.0 in | Wt 190.4 lb

## 2016-04-15 DIAGNOSIS — E119 Type 2 diabetes mellitus without complications: Secondary | ICD-10-CM

## 2016-04-15 DIAGNOSIS — E669 Obesity, unspecified: Secondary | ICD-10-CM | POA: Diagnosis not present

## 2016-04-15 DIAGNOSIS — E1169 Type 2 diabetes mellitus with other specified complication: Secondary | ICD-10-CM | POA: Diagnosis not present

## 2016-04-15 DIAGNOSIS — E785 Hyperlipidemia, unspecified: Secondary | ICD-10-CM

## 2016-04-15 DIAGNOSIS — R0789 Other chest pain: Secondary | ICD-10-CM | POA: Diagnosis not present

## 2016-04-15 LAB — COMPREHENSIVE METABOLIC PANEL
ALBUMIN: 4.1 g/dL (ref 3.5–5.2)
ALK PHOS: 77 U/L (ref 39–117)
ALT: 27 U/L (ref 0–35)
AST: 27 U/L (ref 0–37)
BILIRUBIN TOTAL: 0.5 mg/dL (ref 0.2–1.2)
BUN: 16 mg/dL (ref 6–23)
CO2: 24 mEq/L (ref 19–32)
Calcium: 9.9 mg/dL (ref 8.4–10.5)
Chloride: 105 mEq/L (ref 96–112)
Creatinine, Ser: 0.7 mg/dL (ref 0.40–1.20)
GFR: 87.24 mL/min (ref >60.00)
Glucose, Bld: 218 mg/dL — ABNORMAL HIGH (ref 70–99)
POTASSIUM: 4.2 meq/L (ref 3.5–5.1)
SODIUM: 139 meq/L (ref 135–145)
TOTAL PROTEIN: 6.7 g/dL (ref 6.0–8.3)

## 2016-04-15 LAB — LIPID PANEL
CHOLESTEROL: 171 mg/dL (ref 0–200)
HDL: 42.9 mg/dL (ref >39.00)
NonHDL: 128.38
Total CHOL/HDL Ratio: 4
Triglycerides: 204 mg/dL — ABNORMAL HIGH (ref 0.0–149.0)
VLDL: 40.8 mg/dL — AB (ref 0.0–40.0)

## 2016-04-15 LAB — HEMOGLOBIN A1C: HEMOGLOBIN A1C: 7.2 % — AB (ref 4.6–6.5)

## 2016-04-15 LAB — LDL CHOLESTEROL, DIRECT: Direct LDL: 103 mg/dL

## 2016-04-15 MED ORDER — FREESTYLE LANCETS MISC: Status: AC

## 2016-04-15 NOTE — Progress Notes (Signed)
Pre visit review using our clinic review tool, if applicable. No additional management support is needed unless otherwise documented below in the visit note. 

## 2016-04-15 NOTE — Assessment & Plan Note (Signed)
Previously slightly above goal. On simvastatin 20 mg daily and if still elevated can increase. No side effects.

## 2016-04-15 NOTE — Assessment & Plan Note (Signed)
Resolved and she thinks was related to diet.

## 2016-04-15 NOTE — Progress Notes (Signed)
   Subjective:    Patient ID: Faith Orr, female    DOB: 1941/07/08, 75 y.o.   MRN: [id number]  HPI The patient is a 75 YO female coming in for follow up of her diabetes (taking metformin BID, not complicated, not on ACE-I or ARB), her blood pressure (elevated today, not on meds, not high in the past), and her cholesterol (above goal and working on diet and exercise to help, taking simvastatin 20 mg daily without side effects). No new concerns.   Review of Systems  Constitutional: Negative for fever, chills, diaphoresis, activity change, appetite change, fatigue and unexpected weight change.  Respiratory: Negative for cough, shortness of breath and wheezing.   Cardiovascular: Negative for chest pain, palpitations and leg swelling.  Gastrointestinal: Negative for nausea, vomiting, abdominal pain, diarrhea, constipation and abdominal distention.  Musculoskeletal: Negative.   Skin: Negative.   Neurological: Negative.   Psychiatric/Behavioral: Negative.       Objective:   Physical Exam  Constitutional: She is oriented to person, place, and time. She appears well-developed and well-nourished.  HENT:  Head: Normocephalic and atraumatic.  Eyes: EOM are normal.  Neck: Normal range of motion.  Cardiovascular: Normal rate and regular rhythm.   Pulmonary/Chest: Effort normal. No respiratory distress. She has no wheezes. She exhibits no tenderness.  Abdominal: Soft.  Musculoskeletal: She exhibits no edema.  Neurological: She is alert and oriented to person, place, and time.  Skin: Skin is warm and dry.  Psychiatric: She has a normal mood and affect.   Filed Vitals:   04/15/16 1113  BP: 162/100  Pulse: 78  Temp: 98.2 F (36.8 C)  TempSrc: Oral  Resp: 16  Height: 4\' 11"  (1.499 m)  Weight: 190 lb 6.4 oz (86.365 kg)  SpO2: 98%      Assessment & Plan:

## 2016-04-15 NOTE — Assessment & Plan Note (Signed)
Not complicated, last [id number] at goal on metformin BID. No signs of kidney damage and not on ACE-I or ARB due to normal BP. Checking CMP, HgA1c, lipid and adjust as needed.

## 2016-04-15 NOTE — Patient Instructions (Signed)
We will check the labs today and send you the results.   No changes today and keep up the good work with exercise. If you want to talk to a nutritionist your insurance would cover that.   Diabetes and Exercise Exercising regularly is important. It is not just about losing weight. It has many health benefits, such as:  Improving your overall fitness, flexibility, and endurance.  Increasing your bone density.  Helping with weight control.  Decreasing your body fat.  Increasing your muscle strength.  Reducing stress and tension.  Improving your overall health. People with diabetes who exercise gain additional benefits because exercise:  Reduces appetite.  Improves the body's use of blood sugar (glucose).  Helps lower or control blood glucose.  Decreases blood pressure.  Helps control blood lipids (such as cholesterol and triglycerides).  Improves the body's use of the hormone insulin by:  Increasing the body's insulin sensitivity.  Reducing the body's insulin needs.  Decreases the risk for heart disease because exercising:  Lowers cholesterol and triglycerides levels.  Increases the levels of good cholesterol (such as high-density lipoproteins [HDL]) in the body.  Lowers blood glucose levels. YOUR ACTIVITY PLAN  Choose an activity that you enjoy, and set realistic goals. To exercise safely, you should begin practicing any new physical activity slowly, and gradually increase the intensity of the exercise over time. Your health care provider or diabetes educator can help create an activity plan that works for you. General recommendations include:  Encouraging children to engage in at least 60 minutes of physical activity each day.  Stretching and performing strength training exercises, such as yoga or weight lifting, at least 2 times per week.  Performing a total of at least 150 minutes of moderate-intensity exercise each week, such as brisk walking or water  aerobics.  Exercising at least 3 days per week, making sure you allow no more than 2 consecutive days to pass without exercising.  Avoiding long periods of inactivity (90 minutes or more). When you have to spend an extended period of time sitting down, take frequent breaks to walk or stretch. RECOMMENDATIONS FOR EXERCISING WITH TYPE 1 OR TYPE 2 DIABETES   Check your blood glucose before exercising. If blood glucose levels are greater than 240 mg/dL, check for urine ketones. Do not exercise if ketones are present.  Avoid injecting insulin into areas of the body that are going to be exercised. For example, avoid injecting insulin into:  The arms when playing tennis.  The legs when jogging.  Keep a record of:  Food intake before and after you exercise.  Expected peak times of insulin action.  Blood glucose levels before and after you exercise.  The type and amount of exercise you have done.  Review your records with your health care provider. Your health care provider will help you to develop guidelines for adjusting food intake and insulin amounts before and after exercising.  If you take insulin or oral hypoglycemic agents, watch for signs and symptoms of hypoglycemia. They include:  Dizziness.  Shaking.  Sweating.  Chills.  Confusion.  Drink plenty of water while you exercise to prevent dehydration or heat stroke. Body water is lost during exercise and must be replaced.  Talk to your health care provider before starting an exercise program to make sure it is safe for you. Remember, almost any type of activity is better than none.   This information is not intended to replace advice given to you by your health care provider.  Make sure you discuss any questions you have with your health care provider.   Document Released: 02/20/2004 Document Revised: 04/16/2015 Document Reviewed: 05/09/2013 Elsevier Interactive Patient Education [employer].

## 2016-04-15 NOTE — Assessment & Plan Note (Signed)
Down 4 pounds since last visit and congratulated and encouraged to continue exercising.

## 2016-04-17 ENCOUNTER — Ambulatory Visit: Payer: Medicare Other | Admitting: Internal Medicine

## 2016-04-23 ENCOUNTER — Encounter: Payer: Self-pay | Admitting: Geriatric Medicine

## 2016-04-29 ENCOUNTER — Encounter: Payer: Self-pay | Admitting: Obstetrics and Gynecology

## 2016-04-29 ENCOUNTER — Ambulatory Visit (INDEPENDENT_AMBULATORY_CARE_PROVIDER_SITE_OTHER): Payer: Medicare Other | Admitting: Obstetrics and Gynecology

## 2016-04-29 VITALS — BP 114/70 | HR 84 | Resp 14 | Ht 59.25 in | Wt 187.6 lb

## 2016-04-29 DIAGNOSIS — Z01419 Encounter for gynecological examination (general) (routine) without abnormal findings: Secondary | ICD-10-CM | POA: Diagnosis not present

## 2016-04-29 NOTE — Patient Instructions (Signed)
Health Maintenance, Female Adopting a healthy lifestyle and getting preventive care can go a long way to promote health and wellness. Talk with your health care provider about what schedule of regular examinations is right for you. This is a good chance for you to check in with your provider about disease prevention and staying healthy. In between checkups, there are plenty of things you can do on your own. Experts have done a lot of research about which lifestyle changes and preventive measures are most likely to keep you healthy. Ask your health care provider for more information. WEIGHT AND DIET  Eat a healthy diet  Be sure to include plenty of vegetables, fruits, low-fat dairy products, and lean protein.  Do not eat a lot of foods high in solid fats, added sugars, or salt.  Get regular exercise. This is one of the most important things you can do for your health.  Most adults should exercise for at least 150 minutes each week. The exercise should increase your heart rate and make you sweat (moderate-intensity exercise).  Most adults should also do strengthening exercises at least twice a week. This is in addition to the moderate-intensity exercise.  Maintain a healthy weight  Body mass index (BMI) is a measurement that can be used to identify possible weight problems. It estimates body fat based on height and weight. Your health care provider can help determine your BMI and help you achieve or maintain a healthy weight.  For females 20 years of age and older:   A BMI below 18.5 is considered underweight.  A BMI of 18.5 to 24.9 is normal.  A BMI of 25 to 29.9 is considered overweight.  A BMI of 30 and above is considered obese.  Watch levels of cholesterol and blood lipids  You should start having your blood tested for lipids and cholesterol at 73 years of age, then have this test every 5 years.  You may need to have your cholesterol levels checked more often if:  Your lipid  or cholesterol levels are high.  You are older than 73 years of age.  You are at high risk for heart disease.  CANCER SCREENING   Lung Cancer  Lung cancer screening is recommended for adults 55-80 years old who are at high risk for lung cancer because of a history of smoking.  A yearly low-dose CT scan of the lungs is recommended for people who:  Currently smoke.  Have quit within the past 15 years.  Have at least a 30-pack-year history of smoking. A pack year is smoking an average of one pack of cigarettes a day for 1 year.  Yearly screening should continue until it has been 15 years since you quit.  Yearly screening should stop if you develop a health problem that would prevent you from having lung cancer treatment.  Breast Cancer  Practice breast self-awareness. This means understanding how your breasts normally appear and feel.  It also means doing regular breast self-exams. Let your health care provider know about any changes, no matter how small.  If you are in your 20s or 30s, you should have a clinical breast exam (CBE) by a health care provider every 1-3 years as part of a regular health exam.  If you are 40 or older, have a CBE every year. Also consider having a breast X-ray (mammogram) every year.  If you have a family history of breast cancer, talk to your health care provider about genetic screening.  If you   are at high risk for breast cancer, talk to your health care provider about having an MRI and a mammogram every year.  Breast cancer gene (BRCA) assessment is recommended for women who have family members with BRCA-related cancers. BRCA-related cancers include:  Breast.  Ovarian.  Tubal.  Peritoneal cancers.  Results of the assessment will determine the need for genetic counseling and BRCA1 and BRCA2 testing. Cervical Cancer Your health care provider may recommend that you be screened regularly for cancer of the pelvic organs (ovaries, uterus, and  vagina). This screening involves a pelvic examination, including checking for microscopic changes to the surface of your cervix (Pap test). You may be encouraged to have this screening done every 3 years, beginning at age 21.  For women ages 30-65, health care providers may recommend pelvic exams and Pap testing every 3 years, or they may recommend the Pap and pelvic exam, combined with testing for human papilloma virus (HPV), every 5 years. Some types of HPV increase your risk of cervical cancer. Testing for HPV may also be done on women of any age with unclear Pap test results.  Other health care providers may not recommend any screening for nonpregnant women who are considered low risk for pelvic cancer and who do not have symptoms. Ask your health care provider if a screening pelvic exam is right for you.  If you have had past treatment for cervical cancer or a condition that could lead to cancer, you need Pap tests and screening for cancer for at least 20 years after your treatment. If Pap tests have been discontinued, your risk factors (such as having a new sexual partner) need to be reassessed to determine if screening should resume. Some women have medical problems that increase the chance of getting cervical cancer. In these cases, your health care provider may recommend more frequent screening and Pap tests. Colorectal Cancer  This type of cancer can be detected and often prevented.  Routine colorectal cancer screening usually begins at 73 years of age and continues through 73 years of age.  Your health care provider may recommend screening at an earlier age if you have risk factors for colon cancer.  Your health care provider may also recommend using home test kits to check for hidden blood in the stool.  A small camera at the end of a tube can be used to examine your colon directly (sigmoidoscopy or colonoscopy). This is done to check for the earliest forms of colorectal  cancer.  Routine screening usually begins at age 50.  Direct examination of the colon should be repeated every 5-10 years through 73 years of age. However, you may need to be screened more often if early forms of precancerous polyps or small growths are found. Skin Cancer  Check your skin from head to toe regularly.  Tell your health care provider about any new moles or changes in moles, especially if there is a change in a mole's shape or color.  Also tell your health care provider if you have a mole that is larger than the size of a pencil eraser.  Always use sunscreen. Apply sunscreen liberally and repeatedly throughout the day.  Protect yourself by wearing long sleeves, pants, a wide-brimmed hat, and sunglasses whenever you are outside. HEART DISEASE, DIABETES, AND HIGH BLOOD PRESSURE   High blood pressure causes heart disease and increases the risk of stroke. High blood pressure is more likely to develop in:  People who have blood pressure in the high end   of the normal range (130-139/85-89 mm Hg).  People who are overweight or obese.  People who are African American.  If you are 38-23 years of age, have your blood pressure checked every 3-5 years. If you are 61 years of age or older, have your blood pressure checked every year. You should have your blood pressure measured twice--once when you are at a hospital or clinic, and once when you are not at a hospital or clinic. Record the average of the two measurements. To check your blood pressure when you are not at a hospital or clinic, you can use:  An automated blood pressure machine at a pharmacy.  A home blood pressure monitor.  If you are between 45 years and 39 years old, ask your health care provider if you should take aspirin to prevent strokes.  Have regular diabetes screenings. This involves taking a blood sample to check your fasting blood sugar level.  If you are at a normal weight and have a low risk for diabetes,  have this test once every three years after 73 years of age.  If you are overweight and have a high risk for diabetes, consider being tested at a younger age or more often. PREVENTING INFECTION  Hepatitis B  If you have a higher risk for hepatitis B, you should be screened for this virus. You are considered at high risk for hepatitis B if:  You were born in a country where hepatitis B is common. Ask your health care provider which countries are considered high risk.  Your parents were born in a high-risk country, and you have not been immunized against hepatitis B (hepatitis B vaccine).  You have HIV or AIDS.  You use needles to inject street drugs.  You live with someone who has hepatitis B.  You have had sex with someone who has hepatitis B.  You get hemodialysis treatment.  You take certain medicines for conditions, including cancer, organ transplantation, and autoimmune conditions. Hepatitis C  Blood testing is recommended for:  Everyone born from [birth year] through 1963.  Anyone with known risk factors for hepatitis C. Sexually transmitted infections (STIs)  You should be screened for sexually transmitted infections (STIs) including gonorrhea and chlamydia if:  You are sexually active and are younger than 73 years of age.  You are older than 73 years of age and your health care provider tells you that you are at risk for this type of infection.  Your sexual activity has changed since you were last screened and you are at an increased risk for chlamydia or gonorrhea. Ask your health care provider if you are at risk.  If you do not have HIV, but are at risk, it may be recommended that you take a prescription medicine daily to prevent HIV infection. This is called pre-exposure prophylaxis (PrEP). You are considered at risk if:  You are sexually active and do not regularly use condoms or know the HIV status of your partner(s).  You take drugs by injection.  You are sexually  active with a partner who has HIV. Talk with your health care provider about whether you are at high risk of being infected with HIV. If you choose to begin PrEP, you should first be tested for HIV. You should then be tested every 3 months for as long as you are taking PrEP.  PREGNANCY   If you are premenopausal and you may become pregnant, ask your health care provider about preconception counseling.  If you may  become pregnant, take 400 to 800 micrograms (mcg) of folic acid every day.  If you want to prevent pregnancy, talk to your health care provider about birth control (contraception). OSTEOPOROSIS AND MENOPAUSE   Osteoporosis is a disease in which the bones lose minerals and strength with aging. This can result in serious bone fractures. Your risk for osteoporosis can be identified using a bone density scan.  If you are 61 years of age or older, or if you are at risk for osteoporosis and fractures, ask your health care provider if you should be screened.  Ask your health care provider whether you should take a calcium or vitamin D supplement to lower your risk for osteoporosis.  Menopause may have certain physical symptoms and risks.  Hormone replacement therapy may reduce some of these symptoms and risks. Talk to your health care provider about whether hormone replacement therapy is right for you.  HOME CARE INSTRUCTIONS   Schedule regular health, dental, and eye exams.  Stay current with your immunizations.   Do not use any tobacco products including cigarettes, chewing tobacco, or electronic cigarettes.  If you are pregnant, do not drink alcohol.  If you are breastfeeding, limit how much and how often you drink alcohol.  Limit alcohol intake to no more than 1 drink per day for nonpregnant women. One drink equals 12 ounces of beer, 5 ounces of wine, or 1 ounces of hard liquor.  Do not use street drugs.  Do not share needles.  Ask your health care provider for help if  you need support or information about quitting drugs.  Tell your health care provider if you often feel depressed.  Tell your health care provider if you have ever been abused or do not feel safe at home.   This information is not intended to replace advice given to you by your health care provider. Make sure you discuss any questions you have with your health care provider.   Document Released: 06/15/2011 Document Revised: 12/21/2014 Document Reviewed: 11/01/2013 Elsevier Interactive Patient Education [employer].

## 2016-04-29 NOTE — Progress Notes (Signed)
Patient ID: Faith Orr, female   DOB: Oct 13, 1941, 75 y.o.   MRN: [id number] 75 y.o. G1P1 Single, Partnered Caucasian female here for annual exam.    Some urinary leakage if coughs.  This is mild.   Taking both calcium and Vit D daily.   Going to travel to [place].  Partner has completed breast cancer care.   PCP: Pricilla Holm, MD    Patient's last menstrual period was 10/09/2012.           Sexually active: No. female partner The current method of family planning is post menopausal status.    Exercising: Yes.    housework and gardening. Smoker:  no  Health Maintenance: Pap:  04-29-15 Neg History of abnormal Pap:  no MMG:  10-29-15 3D/Density A/Neg/BiRads1:[facility].   Colonoscopy:  10/2012 tubular adenoma with Dr. Sydell Axon Brodie;next due 10/2017. BMD:   06-03-15  Result  Normal:[facility] TDaP:  PCP--Td 04-18-10 Gardasil:   N/A Screening Labs:  Hb today: PCP, Urine today: PCP   reports that she has never smoked. She has never used smokeless tobacco. She reports that she does not drink alcohol or use illicit drugs.  Past Medical History  Diagnosis Date  . Obesity, unspecified   . OSTEOPENIA   . URINARY INCONTINENCE   . DEPRESSION   . DIABETES MELLITUS, TYPE II   . HYPERLIPIDEMIA   . Diverticulosis   . Asthma   . Allergy     seasonal  . Macular degeneration of left eye     mild, Dr.Hecker  . Osteopenia     Past Surgical History  Procedure Laterality Date  . Cesarean section  01/1973  . Right ankle  1994  . Left wrist surgery  2008    By Dr. Latanya Maudlin  . Breast surgery    . Fracture surgery      Current Outpatient Prescriptions  Medication Sig Dispense Refill  . albuterol (PROAIR HFA) 108 (90 BASE) MCG/ACT inhaler Inhale 1-2 puffs into the lungs every 6 (six) hours as needed for wheezing or shortness of breath. 18 g 1  . Blood Glucose Monitoring Suppl (FREESTYLE FREEDOM LITE) W/DEVICE KIT Use to check blood sugars twice a  day Dx 250.00 1 each 0  . Calcium Carbonate-Vitamin D (CALCIUM 600+D HIGH POTENCY) 600-400 MG-UNIT per tablet Take 1 tablet by mouth 2 (two) times daily.     . fluticasone (FLONASE) 50 MCG/ACT nasal spray Place 1 spray into both nostrils daily. (Patient taking differently: Place 1 spray into both nostrils as needed. ) 16 g 2  . glucose blood (FREESTYLE LITE) test strip CHECK BLOOD SUGAR TWICE DAILY AS DIRECTED Dx 250.00 180 each 3  . Lancets (FREESTYLE) lancets Use twice daily to check sugars. 100 each 11  . metFORMIN (GLUCOPHAGE-XR) 500 MG 24 hr tablet TAKE 1 TABLET BY MOUTH TWICE DAILY 180 tablet 3  . METROGEL 1 % gel Apply 1 application topically daily.     . montelukast (SINGULAIR) 10 MG tablet TAKE 1 TABLET BY MOUTH DAILY AS NEEDED 30 tablet 0  . Multiple Vitamins-Minerals (ICAPS) CAPS Take 1 capsule by mouth daily.      . pantoprazole (PROTONIX) 20 MG tablet Take 1 tablet (20 mg total) by mouth daily. (Patient taking differently: Take 20 mg by mouth as needed. ) 30 tablet 3  . sertraline (ZOLOFT) 50 MG tablet Take 1 tablet (50 mg total) by mouth daily. 90 tablet 3  . simvastatin (ZOCOR) 20  MG tablet Take 1 tablet (20 mg total) by mouth daily. 90 tablet 3   No current facility-administered medications for this visit.    Family History  Problem Relation Age of Onset  . Diabetes Father   . Hyperlipidemia Father   . Heart disease Father   . Cancer Father   . Hypertension Father   . Multiple sclerosis Daughter   . Cancer      bladder  . Colon cancer Paternal Grandmother   . Osteoporosis Mother   . Protein S deficiency Mother   . Hyperlipidemia Mother     ROS:  Pertinent items are noted in HPI.  Otherwise, a comprehensive ROS was negative.  Exam:   BP 114/70 mmHg  Pulse 84  Resp 14  Ht 4' 11.25" (1.505 m)  Wt 187 lb 9.6 oz (85.095 kg)  BMI 37.57 kg/m2  LMP 10/09/2012    General appearance: alert, cooperative and appears stated age Head: Normocephalic, without obvious  abnormality, atraumatic Neck: no adenopathy, supple, symmetrical, trachea midline and thyroid normal to inspection and palpation Lungs: clear to auscultation bilaterally Breasts: normal appearance, no masses or tenderness, Inspection negative, No nipple retraction or dimpling, No nipple discharge or bleeding, No axillary or supraclavicular adenopathy Heart: regular rate and rhythm Abdomen: incisions:  Yes.     Vertical midline , soft, non-tender; no masses, no organomegaly Extremities: extremities normal, atraumatic, no cyanosis or edema Skin: Skin color, texture, turgor normal. No rashes or lesions Lymph nodes: Cervical, supraclavicular, and axillary nodes normal. No abnormal inguinal nodes palpated Neurologic: Grossly normal  Pelvic: External genitalia:  no lesions              Urethra:  normal appearing urethra with no masses, tenderness or lesions              Bartholins and Skenes: normal                 Vagina: normal appearing vagina with normal color and discharge, no lesions              Cervix: no lesions              Pap taken: No. Bimanual Exam:  Uterus:  normal size, contour, position, consistency, mobility, non-tender.  Good Kegel.               Adnexa: normal adnexa and no mass, fullness, tenderness              Rectal exam: Yes.  .  Confirms.              Anus:  normal sphincter tone, no lesions  Chaperone was present for exam.  Assessment:   Well woman visit with normal exam. Mild GSI.   Plan: Yearly mammogram recommended after age 62.  Recommended self breast exam.  Pap and HR HPV as above. Discussed Calcium, Vitamin D, regular exercise program including cardiovascular and weight bearing exercise. Labs performed.  No..     Prescription medication(s) given.  No.   Discussed Kegel's, Impressa, weight loss, and potential PT or surgery if needed for stress incontinence. Will do Kegel's and work on weight loss.  Follow up annually and prn.      After visit  summary provided.

## 2016-05-18 ENCOUNTER — Other Ambulatory Visit: Payer: Self-pay | Admitting: Internal Medicine

## 2016-07-16 ENCOUNTER — Other Ambulatory Visit: Payer: Self-pay | Admitting: Internal Medicine

## 2016-07-29 ENCOUNTER — Other Ambulatory Visit: Payer: Self-pay | Admitting: Internal Medicine

## 2016-08-27 ENCOUNTER — Encounter: Payer: Self-pay | Admitting: Internal Medicine

## 2016-08-27 ENCOUNTER — Ambulatory Visit (INDEPENDENT_AMBULATORY_CARE_PROVIDER_SITE_OTHER): Payer: Medicare Other | Admitting: Internal Medicine

## 2016-08-27 DIAGNOSIS — H66002 Acute suppurative otitis media without spontaneous rupture of ear drum, left ear: Secondary | ICD-10-CM | POA: Diagnosis not present

## 2016-08-27 DIAGNOSIS — R221 Localized swelling, mass and lump, neck: Secondary | ICD-10-CM | POA: Diagnosis not present

## 2016-08-27 DIAGNOSIS — H6692 Otitis media, unspecified, left ear: Secondary | ICD-10-CM | POA: Insufficient documentation

## 2016-08-27 MED ORDER — DOXYCYCLINE HYCLATE 100 MG PO TABS: 100.0000 mg | ORAL_TABLET | Freq: Two times a day (BID) | ORAL | 0 refills | Status: DC

## 2016-08-27 NOTE — Progress Notes (Signed)
Pre visit review using our clinic review tool, if applicable. No additional management support is needed unless otherwise documented below in the visit note. 

## 2016-08-27 NOTE — Progress Notes (Signed)
   Subjective:    Patient ID: Faith Orr, female    DOB: January 08, 1941, 75 y.o.   MRN: [id number]  HPI The patient is a 75 YO female coming in for swollen gland on her neck for about 5 days. Denies cough or sore throat. Some ear pain. No fevers or chills but having malaise. She had a diarrheal illness that lasted about 1 day 1 week ago. Mild nasal congestion and drainage but not severe. Has not tried anything for it. It is painful to touch and was hard to sleep last night.   Review of Systems  Constitutional: Positive for activity change. Negative for appetite change, chills, diaphoresis, fatigue, fever and unexpected weight change.  HENT: Positive for ear pain and facial swelling. Negative for congestion, postnasal drip, rhinorrhea, sinus pressure, sore throat and trouble swallowing.   Respiratory: Negative for cough, chest tightness, shortness of breath and wheezing.   Cardiovascular: Negative for chest pain, palpitations and leg swelling.  Gastrointestinal: Negative for abdominal distention, abdominal pain, constipation, diarrhea, nausea and vomiting.  Musculoskeletal: Negative.   Skin: Negative.       Objective:   Physical Exam  Constitutional: She is oriented to person, place, and time. She appears well-developed and well-nourished.  HENT:  Head: Normocephalic and atraumatic.  Mouth/Throat: Oropharynx is clear and moist.  Left ear with bulging TM, right TM normal.   Eyes: EOM are normal.  Neck: Normal range of motion.  Large lymph node tender to touch left neck about 1 cm circular.   Cardiovascular: Normal rate and regular rhythm.   Pulmonary/Chest: Effort normal. No respiratory distress. She has no wheezes. She exhibits no tenderness.  Abdominal: Soft.  Lymphadenopathy:    She has cervical adenopathy.  Neurological: She is alert and oriented to person, place, and time.  Skin: Skin is warm and dry.   Vitals:   08/27/16 1121  BP: 128/78  Pulse: 80  Resp: 16  Temp: 98.2 F  (36.8 C)  TempSrc: Oral  SpO2: 97%  Weight: 186 lb (84.4 kg)  Height: 4\' 11"  (1.499 m)      Assessment & Plan:

## 2016-08-27 NOTE — Patient Instructions (Signed)
We have sent in doxycycline for the likely infection. Take 1 pill twice a day for 10 days.  If in 1 week your neck is not coming down or feeling better call us or send a mychart message and we will order an ultrasound on the neck to check that spot.

## 2016-08-27 NOTE — Assessment & Plan Note (Signed)
Suspect LN from left otitis media. Rx for doxycycline 10 day course today. If no resolution will get US neck.

## 2016-08-27 NOTE — Assessment & Plan Note (Signed)
Rx for doxycycline 10 day course. PCN allergy and sulfa allergy.

## 2016-09-06 ENCOUNTER — Other Ambulatory Visit: Payer: Self-pay | Admitting: Internal Medicine

## 2016-09-10 ENCOUNTER — Ambulatory Visit (INDEPENDENT_AMBULATORY_CARE_PROVIDER_SITE_OTHER): Payer: Medicare Other

## 2016-09-10 DIAGNOSIS — Z23 Encounter for immunization: Secondary | ICD-10-CM | POA: Diagnosis not present

## 2016-10-09 ENCOUNTER — Other Ambulatory Visit: Payer: Self-pay | Admitting: Internal Medicine

## 2016-10-09 DIAGNOSIS — Z1231 Encounter for screening mammogram for malignant neoplasm of breast: Secondary | ICD-10-CM

## 2016-10-16 ENCOUNTER — Ambulatory Visit (INDEPENDENT_AMBULATORY_CARE_PROVIDER_SITE_OTHER): Payer: Medicare Other | Admitting: Internal Medicine

## 2016-10-16 ENCOUNTER — Encounter: Payer: Self-pay | Admitting: Internal Medicine

## 2016-10-16 ENCOUNTER — Other Ambulatory Visit (INDEPENDENT_AMBULATORY_CARE_PROVIDER_SITE_OTHER): Payer: Medicare Other

## 2016-10-16 VITALS — BP 122/76 | HR 82 | Temp 98.1°F | Resp 16 | Ht 59.5 in | Wt 185.0 lb

## 2016-10-16 DIAGNOSIS — E119 Type 2 diabetes mellitus without complications: Secondary | ICD-10-CM | POA: Diagnosis not present

## 2016-10-16 DIAGNOSIS — J4520 Mild intermittent asthma, uncomplicated: Secondary | ICD-10-CM | POA: Diagnosis not present

## 2016-10-16 DIAGNOSIS — Z0000 Encounter for general adult medical examination without abnormal findings: Secondary | ICD-10-CM

## 2016-10-16 LAB — HEMOGLOBIN A1C: Hgb A1c MFr Bld: 7.1 % — ABNORMAL HIGH (ref 4.6–6.5)

## 2016-10-16 LAB — MICROALBUMIN / CREATININE URINE RATIO
CREATININE, U: 226.4 mg/dL
Microalb Creat Ratio: 0.5 mg/g (ref 0.0–30.0)
Microalb, Ur: 1.1 mg/dL (ref 0.0–1.9)

## 2016-10-16 NOTE — Assessment & Plan Note (Signed)
Checking labs, adjust as needed. Immunizations all up to date. Counseled on sun safety and need for exercise regularly. Given screening recommendations. Colonoscopy and mammogram up to date.

## 2016-10-16 NOTE — Assessment & Plan Note (Signed)
Checking HgA1c and microalbumin to creatinine ratio. Foot exam done. Not complicated and controlled on her metfomin. Adjust as needed.

## 2016-10-16 NOTE — Patient Instructions (Signed)
We will check the labs today and will call you back with the results.   Health Maintenance, Female Adopting a healthy lifestyle and getting preventive care can go a long way to promote health and wellness. Talk with your health care provider about what schedule of regular examinations is right for you. This is a good chance for you to check in with your provider about disease prevention and staying healthy. In between checkups, there are plenty of things you can do on your own. Experts have done a lot of research about which lifestyle changes and preventive measures are most likely to keep you healthy. Ask your health care provider for more information. WEIGHT AND DIET  Eat a healthy diet  Be sure to include plenty of vegetables, fruits, low-fat dairy products, and lean protein.  Do not eat a lot of foods high in solid fats, added sugars, or salt.  Get regular exercise. This is one of the most important things you can do for your health.  Most adults should exercise for at least 150 minutes each week. The exercise should increase your heart rate and make you sweat (moderate-intensity exercise).  Most adults should also do strengthening exercises at least twice a week. This is in addition to the moderate-intensity exercise.  Maintain a healthy weight  Body mass index (BMI) is a measurement that can be used to identify possible weight problems. It estimates body fat based on height and weight. Your health care provider can help determine your BMI and help you achieve or maintain a healthy weight.  For females 13 years of age and older:   A BMI below 18.5 is considered underweight.  A BMI of 18.5 to 24.9 is normal.  A BMI of 25 to 29.9 is considered overweight.  A BMI of 30 and above is considered obese.  Watch levels of cholesterol and blood lipids  You should start having your blood tested for lipids and cholesterol at 74 years of age, then have this test every 5 years.  You may  need to have your cholesterol levels checked more often if:  Your lipid or cholesterol levels are high.  You are older than 73 years of age.  You are at high risk for heart disease.  CANCER SCREENING   Lung Cancer  Lung cancer screening is recommended for adults 35-50 years old who are at high risk for lung cancer because of a history of smoking.  A yearly low-dose CT scan of the lungs is recommended for people who:  Currently smoke.  Have quit within the past 15 years.  Have at least a 30-pack-year history of smoking. A pack year is smoking an average of one pack of cigarettes a day for 1 year.  Yearly screening should continue until it has been 15 years since you quit.  Yearly screening should stop if you develop a health problem that would prevent you from having lung cancer treatment.  Breast Cancer  Practice breast self-awareness. This means understanding how your breasts normally appear and feel.  It also means doing regular breast self-exams. Let your health care provider know about any changes, no matter how small.  If you are in your 20s or 30s, you should have a clinical breast exam (CBE) by a health care provider every 1-3 years as part of a regular health exam.  If you are 43 or older, have a CBE every year. Also consider having a breast X-ray (mammogram) every year.  If you have a family  history of breast cancer, talk to your health care provider about genetic screening.  If you are at high risk for breast cancer, talk to your health care provider about having an MRI and a mammogram every year.  Breast cancer gene (BRCA) assessment is recommended for women who have family members with BRCA-related cancers. BRCA-related cancers include:  Breast.  Ovarian.  Tubal.  Peritoneal cancers.  Results of the assessment will determine the need for genetic counseling and BRCA1 and BRCA2 testing. Cervical Cancer Your health care provider may recommend that you be  screened regularly for cancer of the pelvic organs (ovaries, uterus, and vagina). This screening involves a pelvic examination, including checking for microscopic changes to the surface of your cervix (Pap test). You may be encouraged to have this screening done every 3 years, beginning at age 38.  For women ages 83-65, health care providers may recommend pelvic exams and Pap testing every 3 years, or they may recommend the Pap and pelvic exam, combined with testing for human papilloma virus (HPV), every 5 years. Some types of HPV increase your risk of cervical cancer. Testing for HPV may also be done on women of any age with unclear Pap test results.  Other health care providers may not recommend any screening for nonpregnant women who are considered low risk for pelvic cancer and who do not have symptoms. Ask your health care provider if a screening pelvic exam is right for you.  If you have had past treatment for cervical cancer or a condition that could lead to cancer, you need Pap tests and screening for cancer for at least 20 years after your treatment. If Pap tests have been discontinued, your risk factors (such as having a new sexual partner) need to be reassessed to determine if screening should resume. Some women have medical problems that increase the chance of getting cervical cancer. In these cases, your health care provider may recommend more frequent screening and Pap tests. Colorectal Cancer  This type of cancer can be detected and often prevented.  Routine colorectal cancer screening usually begins at 73 years of age and continues through 73 years of age.  Your health care provider may recommend screening at an earlier age if you have risk factors for colon cancer.  Your health care provider may also recommend using home test kits to check for hidden blood in the stool.  A small camera at the end of a tube can be used to examine your colon directly (sigmoidoscopy or colonoscopy).  This is done to check for the earliest forms of colorectal cancer.  Routine screening usually begins at age 52.  Direct examination of the colon should be repeated every 5-10 years through 73 years of age. However, you may need to be screened more often if early forms of precancerous polyps or small growths are found. Skin Cancer  Check your skin from head to toe regularly.  Tell your health care provider about any new moles or changes in moles, especially if there is a change in a mole's shape or color.  Also tell your health care provider if you have a mole that is larger than the size of a pencil eraser.  Always use sunscreen. Apply sunscreen liberally and repeatedly throughout the day.  Protect yourself by wearing long sleeves, pants, a wide-brimmed hat, and sunglasses whenever you are outside. HEART DISEASE, DIABETES, AND HIGH BLOOD PRESSURE   High blood pressure causes heart disease and increases the risk of stroke. High blood pressure  is more likely to develop in:  People who have blood pressure in the high end of the normal range (130-139/85-89 mm Hg).  People who are overweight or obese.  People who are African American.  If you are 105-22 years of age, have your blood pressure checked every 3-5 years. If you are 70 years of age or older, have your blood pressure checked every year. You should have your blood pressure measured twice--once when you are at a hospital or clinic, and once when you are not at a hospital or clinic. Record the average of the two measurements. To check your blood pressure when you are not at a hospital or clinic, you can use:  An automated blood pressure machine at a pharmacy.  A home blood pressure monitor.  If you are between 41 years and 15 years old, ask your health care provider if you should take aspirin to prevent strokes.  Have regular diabetes screenings. This involves taking a blood sample to check your fasting blood sugar level.  If you  are at a normal weight and have a low risk for diabetes, have this test once every three years after 73 years of age.  If you are overweight and have a high risk for diabetes, consider being tested at a younger age or more often. PREVENTING INFECTION  Hepatitis B  If you have a higher risk for hepatitis B, you should be screened for this virus. You are considered at high risk for hepatitis B if:  You were born in a country where hepatitis B is common. Ask your health care provider which countries are considered high risk.  Your parents were born in a high-risk country, and you have not been immunized against hepatitis B (hepatitis B vaccine).  You have HIV or AIDS.  You use needles to inject street drugs.  You live with someone who has hepatitis B.  You have had sex with someone who has hepatitis B.  You get hemodialysis treatment.  You take certain medicines for conditions, including cancer, organ transplantation, and autoimmune conditions. Hepatitis C  Blood testing is recommended for:  Everyone born from [birth year] through 1963.  Anyone with known risk factors for hepatitis C. Sexually transmitted infections (STIs)  You should be screened for sexually transmitted infections (STIs) including gonorrhea and chlamydia if:  You are sexually active and are younger than 73 years of age.  You are older than 73 years of age and your health care provider tells you that you are at risk for this type of infection.  Your sexual activity has changed since you were last screened and you are at an increased risk for chlamydia or gonorrhea. Ask your health care provider if you are at risk.  If you do not have HIV, but are at risk, it may be recommended that you take a prescription medicine daily to prevent HIV infection. This is called pre-exposure prophylaxis (PrEP). You are considered at risk if:  You are sexually active and do not regularly use condoms or know the HIV status of your  partner(s).  You take drugs by injection.  You are sexually active with a partner who has HIV. Talk with your health care provider about whether you are at high risk of being infected with HIV. If you choose to begin PrEP, you should first be tested for HIV. You should then be tested every 3 months for as long as you are taking PrEP.  PREGNANCY   If you are premenopausal and  you may become pregnant, ask your health care provider about preconception counseling.  If you may become pregnant, take 400 to 800 micrograms (mcg) of folic acid every day.  If you want to prevent pregnancy, talk to your health care provider about birth control (contraception). OSTEOPOROSIS AND MENOPAUSE   Osteoporosis is a disease in which the bones lose minerals and strength with aging. This can result in serious bone fractures. Your risk for osteoporosis can be identified using a bone density scan.  If you are 53 years of age or older, or if you are at risk for osteoporosis and fractures, ask your health care provider if you should be screened.  Ask your health care provider whether you should take a calcium or vitamin D supplement to lower your risk for osteoporosis.  Menopause may have certain physical symptoms and risks.  Hormone replacement therapy may reduce some of these symptoms and risks. Talk to your health care provider about whether hormone replacement therapy is right for you.  HOME CARE INSTRUCTIONS   Schedule regular health, dental, and eye exams.  Stay current with your immunizations.   Do not use any tobacco products including cigarettes, chewing tobacco, or electronic cigarettes.  If you are pregnant, do not drink alcohol.  If you are breastfeeding, limit how much and how often you drink alcohol.  Limit alcohol intake to no more than 1 drink per day for nonpregnant women. One drink equals 12 ounces of beer, 5 ounces of wine, or 1 ounces of hard liquor.  Do not use street drugs.  Do  not share needles.  Ask your health care provider for help if you need support or information about quitting drugs.  Tell your health care provider if you often feel depressed.  Tell your health care provider if you have ever been abused or do not feel safe at home.   This information is not intended to replace advice given to you by your health care provider. Make sure you discuss any questions you have with your health care provider.   Document Released: 06/15/2011 Document Revised: 12/21/2014 Document Reviewed: 11/01/2013 Elsevier Interactive Patient Education [employer].

## 2016-10-16 NOTE — Progress Notes (Signed)
   Subjective:    Patient ID: Faith Orr, female    DOB: January 10, 1941, 75 y.o.   MRN: [id number]  HPI The patient is a 75 YO female coming in for physical. No new concerns.   PMH, [facility], social history reviewed and updated.   Review of Systems  Constitutional: Negative.   HENT: Negative.   Eyes: Negative.   Respiratory: Negative for cough, chest tightness and shortness of breath.   Cardiovascular: Negative for chest pain, palpitations and leg swelling.  Gastrointestinal: Negative for abdominal distention, abdominal pain, constipation, diarrhea, nausea and vomiting.  Musculoskeletal: Positive for arthralgias. Negative for gait problem, joint swelling and myalgias.  Skin: Negative.   Neurological: Negative.   Psychiatric/Behavioral: Negative.       Objective:   Physical Exam  Constitutional: She is oriented to person, place, and time. She appears well-developed and well-nourished.  HENT:  Head: Normocephalic and atraumatic.  Eyes: EOM are normal.  Neck: Normal range of motion.  Cardiovascular: Normal rate and regular rhythm.   Pulmonary/Chest: Effort normal and breath sounds normal. No respiratory distress. She has no wheezes. She has no rales.  Abdominal: Soft. Bowel sounds are normal. She exhibits no distension. There is no tenderness. There is no rebound.  Musculoskeletal: She exhibits no edema.  Neurological: She is alert and oriented to person, place, and time. Coordination normal.  Skin: Skin is warm and dry.  Psychiatric: She has a normal mood and affect.   Vitals:   10/16/16 1058  BP: 122/76  Pulse: 82  Resp: 16  Temp: 98.1 F (36.7 C)  TempSrc: Oral  SpO2: 97%  Weight: 185 lb (83.9 kg)  Height: 4' 11.5" (1.511 m)      Assessment & Plan:

## 2016-10-16 NOTE — Assessment & Plan Note (Signed)
Uses rare albuterol and no flare recently.

## 2016-10-16 NOTE — Progress Notes (Signed)
Pre visit review using our clinic review tool, if applicable. No additional management support is needed unless otherwise documented below in the visit note. 

## 2016-10-25 ENCOUNTER — Other Ambulatory Visit: Payer: Self-pay | Admitting: Internal Medicine

## 2016-11-04 ENCOUNTER — Ambulatory Visit
Admission: RE | Admit: 2016-11-04 | Discharge: 2016-11-04 | Disposition: A | Discharge status: Home / Self Care | Payer: Medicare Other | Source: Ambulatory Visit | Attending: Internal Medicine | Admitting: Internal Medicine

## 2016-11-04 DIAGNOSIS — Z1231 Encounter for screening mammogram for malignant neoplasm of breast: Secondary | ICD-10-CM

## 2016-12-22 ENCOUNTER — Encounter: Payer: Self-pay | Admitting: Internal Medicine

## 2016-12-24 ENCOUNTER — Ambulatory Visit: Payer: Medicare Other | Admitting: Internal Medicine

## 2016-12-25 ENCOUNTER — Ambulatory Visit: Payer: Medicare Other | Admitting: Internal Medicine

## 2017-01-03 ENCOUNTER — Other Ambulatory Visit: Payer: Self-pay | Admitting: Internal Medicine

## 2017-01-28 ENCOUNTER — Other Ambulatory Visit: Payer: Self-pay | Admitting: Internal Medicine

## 2017-03-08 NOTE — Progress Notes (Signed)
[name] [place],  [address] Phone: [phone] Subjective:    I'm seeing this patient by the request  of:  Hoyt Koch, MD   CC: Knee pain  [id number]  Faith Orr is a 76 y.o. female coming in with complaint of knee pain. Seems to be more bilateral. Patient describes the pain as a dull, throbbing aching sensation. States that the right side started having worsening symptoms though recently. Patient states that there maybe and swelling. Had difficulty going up and downstairs for weeks but now seems to be doing a little bit better. Patient though does want to be more active and is concerned about increasing activity secondary to the discomfort.  Patient is also having some mild low back pain. Describes pain as a dull, throbbing aching sensation. Very intermittent radicular symptoms but no weakness. States it seems to be worse when she tries to straighten up or lays down flat at night. States that there can be tingling in her toes when she does this. Seems better when she flexes or leans over such as a cart at the shopping center.    Past Medical History:  Diagnosis Date  . Allergy    seasonal  . Asthma   . DEPRESSION   . DIABETES MELLITUS, TYPE II   . Diverticulosis   . HYPERLIPIDEMIA   . Macular degeneration of left eye    mild, Dr.Hecker  . Obesity, unspecified   . OSTEOPENIA   . Osteopenia   . URINARY INCONTINENCE    Past Surgical History:  Procedure Laterality Date  . BREAST SURGERY    . CESAREAN SECTION  01/1973  . FRACTURE SURGERY    . left wrist surgery  2008   By Dr. Latanya Maudlin  . right ankle  1994   Social History   Social History  . Marital status: Single    Spouse name: N/A  . Number of children: 1  . Years of education: N/A   Occupational History  .  [place] History Main Topics  . Smoking status: Never Smoker  . Smokeless tobacco: Never Used     Comment: Lives  with partner [name]) and son  . Alcohol use No  . Drug use: No  . Sexual activity: No     Comment: Lives with female partner (annette hicks) and 22 yo son   Other Topics Concern  . None   Social History Narrative  . None   Allergies  Allergen Reactions  . Penicillins Anaphylaxis    "serum sickness"  . Aleve [Naproxen Sodium] Swelling    Swelling of face  . Sulfonamide Derivatives Rash   Family History  Problem Relation Age of Onset  . Diabetes Father   . Hyperlipidemia Father   . Heart disease Father   . Cancer Father   . Hypertension Father   . Colon cancer Paternal Grandmother   . Osteoporosis Mother   . Protein S deficiency Mother   . Hyperlipidemia Mother   . Multiple sclerosis Daughter   . Cancer      bladder    Past medical history, social, surgical and family history all reviewed in electronic medical record.  No pertanent information unless stated regarding to the chief complaint.   Review of Systems:Review of systems updated and as accurate as of 03/09/17  No headache, visual changes, nausea, vomiting, diarrhea, constipation, dizziness, abdominal pain, skin rash, fevers, chills, night sweats, weight loss, swollen  lymph nodes, body aches, joint swelling, muscle aches, chest pain, shortness of breath, mood changes.   Objective  Blood pressure 122/86, pulse 84, height 4' 11.5" (1.511 m), weight 185 lb 12.8 oz (84.3 kg), last menstrual period 10/09/2012, SpO2 96 %. Systems examined below as of 03/09/17   General: No apparent distress alert and oriented x3 mood and affect normal, dressed appropriately.  HEENT: Pupils equal, extraocular movements intact  Respiratory: Patient's speak in full sentences and does not appear short of breath  Cardiovascular: No lower extremity edema, non tender, no erythema  Skin: Warm dry intact with no signs of infection or rash on extremities or on axial skeleton.  Abdomen: Soft nontender  Neuro: Cranial nerves II through  XII are intact, neurovascularly intact in all extremities with 2+ DTRs and 2+ pulses.  Lymph: No lymphadenopathy of posterior or anterior cervical chain or axillae bilaterally.  Gait normal with good balance and coordination.  MSK:  Non tender with full range of motion and good stability and symmetric strength and tone of shoulders, elbows, wrist, hip and ankles bilaterally.  Knee: Bilateral valgus deformity noted. Large thigh to calf ratio.  Tender to palpation over the lateral and PF joint line.  ROM full in flexion and extension and lower leg rotation. instability with valgus force.  painful patellar compression. Patellar glide with moderate crepitus. Patellar and quadriceps tendons unremarkable. Hamstring and quadriceps strength is normal.  Back Exam:  Inspection: Unremarkable  Motion: Flexion 35 deg, Extension 15 deg with worsening symptoms., Side Bending to 35 deg bilaterally,  Rotation to 35 deg bilaterally  SLR laying: Negative  XSLR laying: Negative  Palpable tenderness: Tender to palpation in the paraspinal musculature of the lumbar spine. FABER: Negative in tightness bilaterally. Sensory change: Gross sensation intact to all lumbar and sacral dermatomes.  Reflexes: 2+ at both patellar tendons, 2+ at achilles tendons, Babinski's downgoing.  Strength at foot  Plantar-flexion: 5/5 Dorsi-flexion: 5/5 Eversion: 5/5 Inversion: 5/5  Leg strength  Quad: 5/5 Hamstring: 5/5 Hip flexor: 5/5 Hip abductors: 5/5  Gait unremarkable.    MSK US performed of: Bilateral This study was ordered, performed, and interpreted by Charlann Boxer D.O.  Knee: Moderate narrowing of the patellofemoral and lateral joint lines bilaterally. Mild calcific changes that could be consistent with CPPD. Trace effusion of the right knee  IMPRESSION: Bilateral knee arthritis  Procedure note 16109; 15 minutes spent for Therapeutic exercises as stated in above notes.  This included exercises focusing on  stretching, strengthening, with significant focus on eccentric aspects.Low back exercises that included:  Pelvic tilt/bracing instruction to focus on control of the pelvic girdle and lower abdominal muscles  Glute strengthening exercises, focusing on proper firing of the glutes without engaging the low back muscles Proper stretching techniques for maximum relief for the hamstrings, hip flexors, low back and some rotation where tolerated     Proper technique shown and discussed handout in great detail with ATC.  All questions were discussed and answered.     Impression and Recommendations:     This case required medical decision making of moderate complexity.      Note: This dictation was prepared with Dragon dictation along with smaller phrase technology. Any transcriptional errors that result from this process are unintentional.

## 2017-03-09 ENCOUNTER — Ambulatory Visit (INDEPENDENT_AMBULATORY_CARE_PROVIDER_SITE_OTHER): Payer: Medicare Other | Admitting: Family Medicine

## 2017-03-09 ENCOUNTER — Encounter: Payer: Self-pay | Admitting: Family Medicine

## 2017-03-09 ENCOUNTER — Ambulatory Visit: Payer: Self-pay

## 2017-03-09 ENCOUNTER — Ambulatory Visit (INDEPENDENT_AMBULATORY_CARE_PROVIDER_SITE_OTHER)
Admission: RE | Admit: 2017-03-09 | Discharge: 2017-03-09 | Disposition: A | Discharge status: Home / Self Care | Payer: Medicare Other | Source: Ambulatory Visit | Attending: Family Medicine | Admitting: Family Medicine

## 2017-03-09 VITALS — BP 122/86 | HR 84 | Ht 59.5 in | Wt 185.8 lb

## 2017-03-09 DIAGNOSIS — M25461 Effusion, right knee: Secondary | ICD-10-CM

## 2017-03-09 DIAGNOSIS — M545 Low back pain: Secondary | ICD-10-CM

## 2017-03-09 DIAGNOSIS — G8929 Other chronic pain: Secondary | ICD-10-CM

## 2017-03-09 DIAGNOSIS — M5136 Other intervertebral disc degeneration, lumbar region: Secondary | ICD-10-CM | POA: Diagnosis not present

## 2017-03-09 DIAGNOSIS — M25561 Pain in right knee: Secondary | ICD-10-CM

## 2017-03-09 DIAGNOSIS — M25562 Pain in left knee: Secondary | ICD-10-CM

## 2017-03-09 DIAGNOSIS — M170 Bilateral primary osteoarthritis of knee: Secondary | ICD-10-CM | POA: Diagnosis not present

## 2017-03-09 DIAGNOSIS — M51369 Other intervertebral disc degeneration, lumbar region without mention of lumbar back pain or lower extremity pain: Secondary | ICD-10-CM | POA: Insufficient documentation

## 2017-03-09 MED ORDER — GABAPENTIN 100 MG PO CAPS: 200.0000 mg | ORAL_CAPSULE | Freq: Every day | ORAL | 3 refills | Status: DC

## 2017-03-09 NOTE — Patient Instructions (Signed)
Good to see you.  Xrays downstairs today  Ice 20 minutes 2 times daily. Usually after activity and before bed. pennsaid pinkie amount topically 2 times daily as needed.  Exercises 3 times a week.  Spenco orthotics "total support" online would be great  Vitamin D 2000 IU daily  Tart cherry extract any dose at night One prescription gabapentin 200mg  at night YOu should do well See me again in 3-5 weeks.

## 2017-03-09 NOTE — Assessment & Plan Note (Signed)
Likely degenerative disc disease. X-rays pending. Patient given home exercises, topical anti-inflammatory's, icing, we discussed core stability, ergonomics, posture. F/u In 4 weeks

## 2017-03-09 NOTE — Assessment & Plan Note (Signed)
Patient does have bilateral knee arthritis. We discussed with patient at great length. Patient will try topical anti-inflammatories, home exercises, icing regimen. Patient try to increase activity as tolerated. Activities potentially avoid. Patient declined any type of bracing. Worsening symptoms we'll consider injection. Follow-up 4 weeks

## 2017-04-01 ENCOUNTER — Encounter: Payer: Self-pay | Admitting: Internal Medicine

## 2017-04-01 LAB — HM DIABETES EYE EXAM

## 2017-04-01 NOTE — Progress Notes (Unsigned)
Results entered and sent to scan  

## 2017-04-06 ENCOUNTER — Encounter: Payer: Self-pay | Admitting: Family Medicine

## 2017-04-08 ENCOUNTER — Other Ambulatory Visit: Payer: Self-pay

## 2017-04-08 ENCOUNTER — Telehealth: Payer: Self-pay

## 2017-04-08 MED ORDER — GABAPENTIN 100 MG PO CAPS: 200.0000 mg | ORAL_CAPSULE | Freq: Every day | ORAL | 3 refills | Status: DC

## 2017-04-08 NOTE — Telephone Encounter (Signed)
Spoke with patient in regards to refill per Dr. Tamala Julian.

## 2017-04-13 ENCOUNTER — Encounter: Payer: Self-pay | Admitting: Family Medicine

## 2017-04-13 ENCOUNTER — Ambulatory Visit (INDEPENDENT_AMBULATORY_CARE_PROVIDER_SITE_OTHER): Payer: Medicare Other | Admitting: Family Medicine

## 2017-04-13 DIAGNOSIS — M5136 Other intervertebral disc degeneration, lumbar region: Secondary | ICD-10-CM

## 2017-04-13 DIAGNOSIS — M170 Bilateral primary osteoarthritis of knee: Secondary | ICD-10-CM | POA: Diagnosis not present

## 2017-04-13 MED ORDER — GABAPENTIN 100 MG PO CAPS: 200.0000 mg | ORAL_CAPSULE | Freq: Every day | ORAL | 3 refills | Status: DC

## 2017-04-13 NOTE — Patient Instructions (Signed)
Good to see you  Made my job too easy  Brace with a lot of activity  We can always do injection if worse With the gabapentin can use as needed  Can take 1-3 pills of the gabapentin when you need it See me again when you need me

## 2017-04-13 NOTE — Assessment & Plan Note (Signed)
Doing well overall. No changes in management. Worsening symptoms we'll consider injections.

## 2017-04-13 NOTE — Assessment & Plan Note (Signed)
Stable and doing well with gabapentin. We discussed the potential titration over the course of time. Patient will come back and see me again as needed.

## 2017-04-13 NOTE — Progress Notes (Signed)
[name] [address], [place] [address] Phone: [phone] Subjective:    I'm seeing this patient by the request  of:  Hoyt Koch, MD   CC: Knee pain f/u back pain f/u   [id number]  Faith Orr is a 76 y.o. female coming in with complaint of knee pain. Seems to be more bilateral.Found to have moderate arthritic changes. Patient has been doing very well with conservative therapy and wearing the brace with a lot of activity. States that she is feeling 80% better. Still at the end of a lot of activity has some swelling especially the right knee. States that the swelling goes down by the morning. No new symptoms.  Patient's low back pain was concern for more of a spinal stenosis. Started on gabapentin is feeling significantly better at this time. Patient though that she is no longer having any radicular symptoms and is noticing that she can do more activity without leading to worsening back pain.    Past Medical History:  Diagnosis Date  . Allergy    seasonal  . Asthma   . DEPRESSION   . DIABETES MELLITUS, TYPE II   . Diverticulosis   . HYPERLIPIDEMIA   . Macular degeneration of left eye    mild, Dr.Hecker  . Obesity, unspecified   . OSTEOPENIA   . Osteopenia   . URINARY INCONTINENCE    Past Surgical History:  Procedure Laterality Date  . BREAST SURGERY    . CESAREAN SECTION  01/1973  . FRACTURE SURGERY    . left wrist surgery  2008   By Dr. Latanya Maudlin  . right ankle  1994   Social History   Social History  . Marital status: Single    Spouse name: N/A  . Number of children: 1  . Years of education: N/A   Occupational History  .  [employer] History Main Topics  . Smoking status: Never Smoker  . Smokeless tobacco: Never Used     Comment: Lives with partner [name]) and son  . Alcohol use No  . Drug use: No  . Sexual activity: No     Comment: Lives with female partner (annette  hicks) and 3 yo son   Other Topics Concern  . Not on file   Social History Narrative  . No narrative on file   Allergies  Allergen Reactions  . Penicillins Anaphylaxis    "serum sickness"  . Aleve [Naproxen Sodium] Swelling    Swelling of face  . Sulfonamide Derivatives Rash   Family History  Problem Relation Age of Onset  . Diabetes Father   . Hyperlipidemia Father   . Heart disease Father   . Cancer Father   . Hypertension Father   . Colon cancer Paternal Grandmother   . Osteoporosis Mother   . Protein S deficiency Mother   . Hyperlipidemia Mother   . Multiple sclerosis Daughter   . Cancer      bladder    Past medical history, social, surgical and family history all reviewed in electronic medical record.  No pertanent information unless stated regarding to the chief complaint.   Review of Systems: No headache, visual changes, nausea, vomiting, diarrhea, constipation, dizziness, abdominal pain, skin rash, fevers, chills, night sweats, weight loss, swollen lymph nodes, body aches, joint swelling, muscle aches, chest pain, shortness of breath, mood changes.    Objective  Last menstrual period 10/09/2012.   Systems examined below  as of 04/13/17 General: NAD A&O x3 mood, affect normal  HEENT: Pupils equal, extraocular movements intact no nystagmus Respiratory: not short of breath at rest or with speaking Cardiovascular: No lower extremity edema, non tender Skin: Warm dry intact with no signs of infection or rash on extremities or on axial skeleton. Abdomen: Soft nontender, no masses Neuro: Cranial nerves  intact, neurovascularly intact in all extremities with 2+ DTRs and 2+ pulses. Lymph: No lymphadenopathy appreciated today  Gait normal with good balance and coordination.  MSK: Non tender with full range of motion and good stability and symmetric strength and tone of shoulders, elbows, wrist,  hips and ankles bilaterally.   Knee: Bilateral valgus deformity noted.  Large thigh to calf ratio.  Minimal tenderness noted today  ROM full in flexion and extension and lower leg rotation. instability with valgus force.  painful patellar compression less pain overall. Patellar glide with mild crepitus. Patellar and quadriceps tendons unremarkable. Hamstring and quadriceps strength is normal.   Back Exam:  Inspection: Unremarkable  Motion: Flexion 35 deg, Extension 25 deg, Side Bending to 35 deg bilaterally,  Rotation to 35 deg bilaterally  SLR laying: Negative  XSLR laying: Negative  Palpable tenderness: Minimal discomfort in the paraspinal musculature lumbar spine FABER: Negative in tightness bilaterally. Sensory change: Gross sensation intact to all lumbar and sacral dermatomes.  Reflexes: 2+ at both patellar tendons, 2+ at achilles tendons, Babinski's downgoing.  Strength at foot  Plantar-flexion: 5/5 Dorsi-flexion: 5/5 Eversion: 5/5 Inversion: 5/5  Leg strength  Quad: 5/5 Hamstring: 5/5 Hip flexor: 5/5 Hip abductors: 5/5  Gait unremarkable.      Impression and Recommendations:     This case required medical decision making of moderate complexity.      Note: This dictation was prepared with Dragon dictation along with smaller phrase technology. Any transcriptional errors that result from this process are unintentional.

## 2017-04-13 NOTE — Progress Notes (Signed)
Pre-visit discussion using our clinic review tool. No additional management support is needed unless otherwise documented below in the visit note.  

## 2017-04-19 ENCOUNTER — Ambulatory Visit: Payer: Medicare Other | Admitting: Internal Medicine

## 2017-04-23 ENCOUNTER — Encounter: Payer: Self-pay | Admitting: Internal Medicine

## 2017-04-23 ENCOUNTER — Ambulatory Visit (INDEPENDENT_AMBULATORY_CARE_PROVIDER_SITE_OTHER): Payer: Medicare Other | Admitting: Internal Medicine

## 2017-04-23 ENCOUNTER — Other Ambulatory Visit (INDEPENDENT_AMBULATORY_CARE_PROVIDER_SITE_OTHER): Payer: Medicare Other

## 2017-04-23 VITALS — BP 130/80 | HR 92 | Temp 98.5°F | Resp 12 | Ht 59.5 in | Wt 183.0 lb

## 2017-04-23 DIAGNOSIS — E1169 Type 2 diabetes mellitus with other specified complication: Secondary | ICD-10-CM | POA: Diagnosis not present

## 2017-04-23 DIAGNOSIS — E785 Hyperlipidemia, unspecified: Secondary | ICD-10-CM | POA: Diagnosis not present

## 2017-04-23 DIAGNOSIS — H9193 Unspecified hearing loss, bilateral: Secondary | ICD-10-CM | POA: Diagnosis not present

## 2017-04-23 DIAGNOSIS — E119 Type 2 diabetes mellitus without complications: Secondary | ICD-10-CM

## 2017-04-23 DIAGNOSIS — K635 Polyp of colon: Secondary | ICD-10-CM

## 2017-04-23 LAB — LIPID PANEL
CHOLESTEROL: 183 mg/dL (ref 0–200)
HDL: 45.1 mg/dL (ref >39.00)
NonHDL: 137.46
TRIGLYCERIDES: 348 mg/dL — AB (ref 0.0–149.0)
Total CHOL/HDL Ratio: 4
VLDL: 69.6 mg/dL — AB (ref 0.0–40.0)

## 2017-04-23 LAB — COMPREHENSIVE METABOLIC PANEL
ALBUMIN: 4.1 g/dL (ref 3.5–5.2)
ALK PHOS: 93 U/L (ref 39–117)
ALT: 50 U/L — ABNORMAL HIGH (ref 0–35)
AST: 53 U/L — ABNORMAL HIGH (ref 0–37)
BUN: 16 mg/dL (ref 6–23)
CALCIUM: 10.3 mg/dL (ref 8.4–10.5)
CO2: 26 mEq/L (ref 19–32)
Chloride: 103 mEq/L (ref 96–112)
Creatinine, Ser: 0.68 mg/dL (ref 0.40–1.20)
GFR: 89.95 mL/min (ref >60.00)
Glucose, Bld: 203 mg/dL — ABNORMAL HIGH (ref 70–99)
POTASSIUM: 3.8 meq/L (ref 3.5–5.1)
Sodium: 138 mEq/L (ref 135–145)
Total Bilirubin: 0.6 mg/dL (ref 0.2–1.2)
Total Protein: 6.9 g/dL (ref 6.0–8.3)

## 2017-04-23 LAB — HEMOGLOBIN A1C: HEMOGLOBIN A1C: 7.8 % — AB (ref 4.6–6.5)

## 2017-04-23 LAB — LDL CHOLESTEROL, DIRECT: LDL DIRECT: 95 mg/dL

## 2017-04-23 NOTE — Patient Instructions (Signed)
We will check the labs today and send you the results.  We have placed the order for the hearing test so you can call them to schedule.

## 2017-04-23 NOTE — Assessment & Plan Note (Signed)
Checking HgA1c and lipid panel. Taking metformin xr and declines change today. Adjust as needed. Foot exam and eye exam are up to date. Not complicated at this time.

## 2017-04-23 NOTE — Assessment & Plan Note (Signed)
Due for yearly lipid panel with goal LDL <100 (ideal <70). Taking simvastatin 20 mg daily.

## 2017-04-23 NOTE — Progress Notes (Signed)
   Subjective:    Patient ID: Faith Orr, female    DOB: 01/09/1941, 76 y.o.   MRN: [id number]  HPI The patient is a 76 YO female coming in for follow up of her diabetes. She denies new numbness in her feet or hands or pain. She denies low sugars. Some diarrhea with certain foods which was not present before being on metformin. She is taking 500 mg XR metformin once daily. She denies chest pains or SOB.   Review of Systems  Constitutional: Negative.   Respiratory: Negative.   Cardiovascular: Negative.   Gastrointestinal: Negative.   Musculoskeletal: Positive for arthralgias. Negative for back pain, gait problem, joint swelling and myalgias.  Skin: Negative.   Neurological: Negative.       Objective:   Physical Exam  Constitutional: She is oriented to person, place, and time. She appears well-developed and well-nourished.  HENT:  Head: Normocephalic and atraumatic.  Eyes: EOM are normal.  Neck: Normal range of motion.  Cardiovascular: Normal rate and regular rhythm.   Pulmonary/Chest: Effort normal.  Abdominal: Soft. Bowel sounds are normal. She exhibits no distension. There is no tenderness. There is no rebound.  Neurological: She is alert and oriented to person, place, and time.  Skin: Skin is warm and dry.   Vitals:   04/23/17 1300  BP: 130/80  Pulse: 92  Resp: 12  Temp: 98.5 F (36.9 C)  TempSrc: Oral  SpO2: 99%  Weight: 183 lb (83 kg)  Height: 4' 11.5" (1.511 m)      Assessment & Plan:

## 2017-04-29 NOTE — Progress Notes (Signed)
76 y.o. G1P1 Single Caucasian female here for annual exam.    No vaginal bleeding or drainage.  No bladder or bowel problems.   A1C was 7.8.  Gardening and water aerobics.   Mother is in Hospice.  She is in her 7s.   Labs with PCP.   PCP:  Pricilla Holm, MD  Patient's last menstrual period was 10/09/2012.           Sexually active: No. female partner The current method of family planning is post menopausal status.    Exercising: Yes.    gardening Smoker:  no  Health Maintenance: Pap: 04-29-15 Neg          12-11-11 Neg History of abnormal Pap:  no MMG: 11-04-16 Density A/Neg/BiRads1:TBC Colonoscopy: 10/2012 tubular adenoma with Dr. Sydell Axon Brodie;next due 10/2017. BMD: 06-03-15  Result: Normal:TBC TDaP:  Td 04-18-10 Gardasil:   no HIV:  Donated blood.  Hep C:  Donated blood.  Screening Labs:  Hb today: PCP, Urine today: not done   reports that she has never smoked. She has never used smokeless tobacco. She reports that she does not drink alcohol or use drugs.  Past Medical History:  Diagnosis Date  . Allergy    seasonal  . Asthma   . DEPRESSION   . DIABETES MELLITUS, TYPE II   . Diverticulosis   . HYPERLIPIDEMIA   . Macular degeneration of left eye    mild, Dr.Hecker  . Obesity, unspecified   . Osteoarthritis of both knees   . OSTEOPENIA   . Osteopenia   . URINARY INCONTINENCE     Past Surgical History:  Procedure Laterality Date  . BREAST SURGERY    . CESAREAN SECTION  01/1973  . FRACTURE SURGERY    . left wrist surgery  2008   By Dr. Latanya Maudlin  . right ankle  1994    Current Outpatient Prescriptions  Medication Sig Dispense Refill  . albuterol (PROAIR HFA) 108 (90 BASE) MCG/ACT inhaler Inhale 1-2 puffs into the lungs every 6 (six) hours as needed for wheezing or shortness of breath. 18 g 1  . Blood Glucose Monitoring Suppl (FREESTYLE FREEDOM LITE) W/DEVICE KIT Use to check blood sugars twice a day Dx 250.00 1 each 0  . Calcium Carbonate-Vitamin D  (CALCIUM 600+D HIGH POTENCY) 600-400 MG-UNIT per tablet Take 1 tablet by mouth 2 (two) times daily.     . fluticasone (FLONASE) 50 MCG/ACT nasal spray Place 1 spray into both nostrils daily. (Patient taking differently: Place 1 spray into both nostrils as needed. ) 16 g 2  . gabapentin (NEURONTIN) 100 MG capsule Take 2 capsules (200 mg total) by mouth at bedtime. (Patient taking differently: Take 200 mg by mouth at bedtime as needed. ) 180 capsule 3  . glucose blood (FREESTYLE LITE) test strip CHECK BLOOD SUGAR TWICE DAILY AS DIRECTED Dx 250.00 180 each 3  . Lancets (FREESTYLE) lancets Use twice daily to check sugars. 100 each 11  . metFORMIN (GLUCOPHAGE-XR) 500 MG 24 hr tablet TAKE 1 TABLET BY MOUTH TWICE DAILY 180 tablet 0  . METROGEL 1 % gel Apply 1 application topically daily.     . montelukast (SINGULAIR) 10 MG tablet TAKE 1 TABLET BY MOUTH DAILY AS NEEDED 30 tablet 3  . Multiple Vitamins-Minerals (ICAPS) CAPS Take 1 capsule by mouth daily.      . pantoprazole (PROTONIX) 20 MG tablet TAKE 1 TABLET(20 MG) BY MOUTH DAILY 30 tablet 3  . sertraline (ZOLOFT) 50 MG tablet TAKE 1  TABLET BY MOUTH DAILY 90 tablet 0  . simvastatin (ZOCOR) 20 MG tablet TAKE 1 TABLET(20 MG) BY MOUTH DAILY 90 tablet 0   No current facility-administered medications for this visit.     Family History  Problem Relation Age of Onset  . Diabetes Father   . Hyperlipidemia Father   . Heart disease Father   . Cancer Father   . Hypertension Father   . Colon cancer Paternal Grandmother   . Osteoporosis Mother   . Protein S deficiency Mother   . Hyperlipidemia Mother   . Multiple sclerosis Daughter   . Cancer Unknown        bladder    ROS:  Pertinent items are noted in HPI.  Otherwise, a comprehensive ROS was negative.  Exam:   BP 122/72 (BP Location: Right Arm, Patient Position: Sitting, Cuff Size: Normal)   Pulse 76   Resp 16   Ht _0  (1.499 m)   Wt 183 lb 9.6 oz (83.3 kg)   LMP 10/09/2012   BMI 37.08  kg/m     General appearance: alert, cooperative and appears stated age Head: Normocephalic, without obvious abnormality, atraumatic Neck: no adenopathy, supple, symmetrical, trachea midline and thyroid normal to inspection and palpation Lungs: clear to auscultation bilaterally Breasts: normal appearance, no masses or tenderness, No nipple retraction or dimpling, No nipple discharge or bleeding, No axillary or supraclavicular adenopathy Heart: regular rate and rhythm Abdomen: soft, non-tender; no masses, no organomegaly Extremities: extremities normal, atraumatic, no cyanosis or edema Skin: Skin color, texture, turgor normal. No rashes or lesions Lymph nodes: Cervical, supraclavicular, and axillary nodes normal. No abnormal inguinal nodes palpated Neurologic: Grossly normal  Pelvic: External genitalia:  no lesions              Urethra:  normal appearing urethra with no masses, tenderness or lesions              Bartholins and Skenes: normal                 Vagina: normal appearing vagina with normal color and discharge, no lesions              Cervix: no lesions              Pap taken: Yes.   Bimanual Exam:  Uterus:  normal size, contour, position, consistency, mobility, non-tender.  BM exam limited.              Adnexa: no mass, fullness, tenderness              Rectal exam: Yes.  .  Confirms.              Anus:  normal sphincter tone, no lesions  Chaperone was present for exam.  Assessment:   Well woman visit with normal exam. Diabetes. Hyperlipidemia.   Plan: Mammogram screening discussed. Recommended self breast awareness. Pap and HR HPV as above. Guidelines for Calcium, Vitamin D, regular exercise program including cardiovascular and weight bearing exercise. Bereavement support given.  Follow up annually and prn.   After visit summary provided.

## 2017-04-30 ENCOUNTER — Ambulatory Visit (INDEPENDENT_AMBULATORY_CARE_PROVIDER_SITE_OTHER): Payer: Medicare Other | Admitting: Obstetrics and Gynecology

## 2017-04-30 ENCOUNTER — Encounter: Payer: Self-pay | Admitting: Obstetrics and Gynecology

## 2017-04-30 VITALS — BP 122/72 | HR 76 | Resp 16 | Ht 59.0 in | Wt 183.6 lb

## 2017-04-30 DIAGNOSIS — Z01419 Encounter for gynecological examination (general) (routine) without abnormal findings: Secondary | ICD-10-CM

## 2017-04-30 NOTE — Patient Instructions (Signed)

## 2017-05-03 LAB — IPS PAP SMEAR ONLY

## 2017-05-17 ENCOUNTER — Other Ambulatory Visit: Payer: Self-pay | Admitting: Internal Medicine

## 2017-08-02 ENCOUNTER — Ambulatory Visit: Payer: Self-pay

## 2017-08-02 ENCOUNTER — Encounter: Payer: Self-pay | Admitting: Family Medicine

## 2017-08-02 ENCOUNTER — Ambulatory Visit (INDEPENDENT_AMBULATORY_CARE_PROVIDER_SITE_OTHER): Payer: Medicare Other | Admitting: Family Medicine

## 2017-08-02 VITALS — BP 130/80 | HR 80

## 2017-08-02 DIAGNOSIS — M1812 Unilateral primary osteoarthritis of first carpometacarpal joint, left hand: Secondary | ICD-10-CM | POA: Diagnosis not present

## 2017-08-02 DIAGNOSIS — M79642 Pain in left hand: Secondary | ICD-10-CM

## 2017-08-02 MED ORDER — ALLOPURINOL 100 MG PO TABS: 100.0000 mg | ORAL_TABLET | Freq: Every day | ORAL | 6 refills | Status: DC

## 2017-08-02 MED ORDER — PREDNISONE 50 MG PO TABS: 50.0000 mg | ORAL_TABLET | Freq: Every day | ORAL | 0 refills | Status: DC

## 2017-08-02 NOTE — Assessment & Plan Note (Signed)
Patient doesn't more of a [place] arthritis. Discussed with patient at great length about icing regimen, home exercises, which activities doing which ones to avoid. Discussed diet changes. Started on prednisone because there is a possibility that this is gout. Follow-up again in 3-4 weeks. Discuss potential need for bracing.

## 2017-08-02 NOTE — Patient Instructions (Signed)
Good to see yo  Faith Orr is your friend.  Stay active. Allopurinol daily  Prednisone daily for 5 days.  Wear brace day and night for 3-5 days then nightly for 2 weeks See me again in 10-12 days.

## 2017-08-02 NOTE — Progress Notes (Signed)
[facility] [place], [place] [address] Phone: [phone] Subjective:    I'm seeing this patient by the request  of:    CC:   [id number]  Faith Orr is a 76 y.o. female coming in with complaint of left hand pain. Late Saturday she woke up with intense pain in the left hand. She is unable to make a fist. She has tried heat, ice, Tylenol, and Advil.   Onset- Saturday Location- left hand Duration- constant Character-sharp Aggravating factors-movements Reliving factors-  Therapies tried- Advil Severity-10/10 with basic range of motion     Past Medical History:  Diagnosis Date  . Allergy    seasonal  . Asthma   . DEPRESSION   . DIABETES MELLITUS, TYPE II   . Diverticulosis   . HYPERLIPIDEMIA   . Macular degeneration of left eye    mild, Dr.Hecker  . Obesity, unspecified   . Osteoarthritis of both knees   . OSTEOPENIA   . Osteopenia   . URINARY INCONTINENCE    Past Surgical History:  Procedure Laterality Date  . BREAST SURGERY    . CESAREAN SECTION  01/1973  . FRACTURE SURGERY    . left wrist surgery  2008   By Dr. Latanya Maudlin  . right ankle  1994   Social History   Social History  . Marital status: Single    Spouse name: N/A  . Number of children: 1  . Years of education: N/A   Occupational History  .  [place] History Main Topics  . Smoking status: Never Smoker  . Smokeless tobacco: Never Used     Comment: Lives with partner Faith Orr) and son  . Alcohol use No  . Drug use: No  . Sexual activity: No     Comment: Lives with female partner (Faith Orr) and 93 yo son   Other Topics Concern  . Not on file   Social History Narrative  . No narrative on file   Allergies  Allergen Reactions  . Penicillins Anaphylaxis    "serum sickness"  . Aleve [Naproxen Sodium] Swelling    Swelling of face  . Sulfonamide Derivatives Rash   Family History  Problem Relation Age of Onset   . Diabetes Father   . Hyperlipidemia Father   . Heart disease Father   . Cancer Father   . Hypertension Father   . Colon cancer Paternal Grandmother   . Osteoporosis Mother   . Protein S deficiency Mother   . Hyperlipidemia Mother   . Multiple sclerosis Daughter   . Cancer Unknown        bladder     Past medical history, social, surgical and family history all reviewed in electronic medical record.  No pertanent information unless stated regarding to the chief complaint.   Review of Systems:Review of systems updated and as accurate as of 08/02/17  No headache, visual changes, nausea, vomiting, diarrhea, constipation, dizziness, abdominal pain, skin rash, fevers, chills, night sweats, weight loss, swollen lymph nodes, body aches, joint swelling, muscle aches, chest pain, shortness of breath, mood changes.   Objective  Blood pressure 120/80, last menstrual period 10/09/2012. Systems examined below as of 08/02/17   General: No apparent distress alert and oriented x3 mood and affect normal, dressed appropriately.  HEENT: Pupils equal, extraocular movements intact  Respiratory: Patient's speak in full sentences and does not appear short of breath  Cardiovascular: No lower extremity edema, non tender,  no erythema  Skin: Warm dry intact with no signs of infection or rash on extremities or on axial skeleton.  Abdomen: Soft nontender  Neuro: Cranial nerves II through XII are intact, neurovascularly intact in all extremities with 2+ DTRs and 2+ pulses.  Lymph: No lymphadenopathy of posterior or anterior cervical chain or axillae bilaterally.  Gait normal with good balance and coordination.  MSK:  Non tender with full range of motion and good stability and symmetric strength and tone of shoulders, elbows, wrist, hip, knee and ankles bilaterally.     Impression and Recommendations:     This case required medical decision making of moderate complexity.      Note: This dictation was  prepared with Dragon dictation along with smaller phrase technology. Any transcriptional errors that result from this process are unintentional.

## 2017-08-02 NOTE — Progress Notes (Signed)
[name] [address], [place] [address] Phone: [phone] Subjective:     CC: left hand pain   [id number]  Faith Orr is a 76 y.o. female coming in with complaint of left hand pain Patient states in this tendon, these ago. Does not remember any true injury. Started with some swelling and redness. Did do some yard work the day before but very mild. Patient denies any radiation of pain. Local at the left thumb. Before this was on vacation for a long amount of time was not doing exercises but was offered diet.      Past Medical History:  Diagnosis Date  . Allergy    seasonal  . Asthma   . DEPRESSION   . DIABETES MELLITUS, TYPE II   . Diverticulosis   . HYPERLIPIDEMIA   . Macular degeneration of left eye    mild, Dr.Hecker  . Obesity, unspecified   . Osteoarthritis of both knees   . OSTEOPENIA   . Osteopenia   . URINARY INCONTINENCE    Past Surgical History:  Procedure Laterality Date  . BREAST SURGERY    . CESAREAN SECTION  01/1973  . FRACTURE SURGERY    . left wrist surgery  2008   By Dr. Latanya Maudlin  . right ankle  1994   Social History   Social History  . Marital status: Single    Spouse name: N/A  . Number of children: 1  . Years of education: N/A   Occupational History  .  [place] History Main Topics  . Smoking status: Never Smoker  . Smokeless tobacco: Never Used     Comment: Lives with partner [name]) and son  . Alcohol use No  . Drug use: No  . Sexual activity: No     Comment: Lives with female partner (annette hicks) and 37 yo son   Other Topics Concern  . Not on file   Social History Narrative  . No narrative on file   Allergies  Allergen Reactions  . Penicillins Anaphylaxis    "serum sickness"  . Aleve [Naproxen Sodium] Swelling    Swelling of face  . Sulfonamide Derivatives Rash   Family History  Problem Relation Age of Onset  . Diabetes Father   .  Hyperlipidemia Father   . Heart disease Father   . Cancer Father   . Hypertension Father   . Colon cancer Paternal Grandmother   . Osteoporosis Mother   . Protein S deficiency Mother   . Hyperlipidemia Mother   . Multiple sclerosis Daughter   . Cancer Unknown        bladder     Past medical history, social, surgical and family history all reviewed in electronic medical record.  No pertanent information unless stated regarding to the chief complaint.   Review of Systems:Review of systems updated and as accurate as of 08/02/17  No headache, visual changes, nausea, vomiting, diarrhea, constipation, dizziness, abdominal pain, skin rash, fevers, chills, night sweats, weight loss, swollen lymph nodes, body aches, joint swelling,  chest pain, shortness of breath, mood changes. Positive muscle aches  Objective  Blood pressure 120/80, last menstrual period 10/09/2012. Systems examined below as of 08/02/17   General: No apparent distress alert and oriented x3 mood and affect normal, dressed appropriately.  HEENT: Pupils equal, extraocular movements intact  Respiratory: Patient's speak in full sentences and does not appear short of breath  Cardiovascular: No lower extremity  edema, non tender, no erythema  Skin: Warm dry intact with no signs of infection or rash on extremities or on axial skeleton.  Abdomen: Soft nontender  Neuro: Cranial nerves II through XII are intact, neurovascularly intact in all extremities with 2+ DTRs and 2+ pulses.  Lymph: No lymphadenopathy of posterior or anterior cervical chain or axillae bilaterally.  Gait Mild antalgic gait.  MSK:  Non tender with full range of motion and good stability and symmetric strength and tone of shoulders, elbows, wrist, hip, knee and ankles bilaterally. Arthritic changes of multiple joints Left hand exam shows the patient does have some mild swelling over the [facility] joint compared to contralateral sign. Tender to palpation. Mild positive  grind test. UCL ligament is intact. Good capillary refill. Mild warm to palpation. No skin breakdown noted.  Limited muscular skeletal ultrasound was performed and interpreted by Lyndal Pulley  Limited ultrasound of patient's thumb shows the patient does have [place] arthritis but patient does have some effusion of the joint. Patient does have deposits that do appear to be downgoing. No true cortical defect noted. Patient does have increasing Doppler flow to the area.  Impression: [facility] arthritis   Impression and Recommendations:     This case required medical decision making of moderate complexity.      Note: This dictation was prepared with Dragon dictation along with smaller phrase technology. Any transcriptional errors that result from this process are unintentional.

## 2017-08-04 ENCOUNTER — Ambulatory Visit (INDEPENDENT_AMBULATORY_CARE_PROVIDER_SITE_OTHER): Payer: Medicare Other

## 2017-08-04 DIAGNOSIS — Z23 Encounter for immunization: Secondary | ICD-10-CM | POA: Diagnosis not present

## 2017-08-12 ENCOUNTER — Encounter: Payer: Self-pay | Admitting: Family Medicine

## 2017-08-12 ENCOUNTER — Ambulatory Visit (INDEPENDENT_AMBULATORY_CARE_PROVIDER_SITE_OTHER): Payer: Medicare Other | Admitting: Family Medicine

## 2017-08-12 DIAGNOSIS — M10042 Idiopathic gout, left hand: Secondary | ICD-10-CM

## 2017-08-12 DIAGNOSIS — M109 Gout, unspecified: Secondary | ICD-10-CM | POA: Insufficient documentation

## 2017-08-12 DIAGNOSIS — M1812 Unilateral primary osteoarthritis of first carpometacarpal joint, left hand: Secondary | ICD-10-CM | POA: Diagnosis not present

## 2017-08-12 NOTE — Assessment & Plan Note (Signed)
Stable   Continue to monitor   Follow up as needed

## 2017-08-12 NOTE — Patient Instructions (Signed)
Good to see you  DHEA 50 mg daily for 4 weeks then stop but could help with a lot of things I would continue the allopurinol  See me when you need me

## 2017-08-12 NOTE — Assessment & Plan Note (Signed)
Patient has had gout. Encourage patient to take the allopurinol regularly. Do not feel that further laboratory workup is needed.

## 2017-08-12 NOTE — Progress Notes (Signed)
[name] [place], [place] [address] Phone: [phone] Subjective:     CC: left hand pain f/u  QMG:[id number]  Faith Orr is a 76 y.o. 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 Started on allopurinol prednisone. An states that the swelling has resolved completely. Not having much pain. Patient states since having the allopurinol no longer having back pain as well. Even her knee pain seems to feel better.     Past Medical History:  Diagnosis Date  . Allergy    seasonal  . Asthma   . DEPRESSION   . DIABETES MELLITUS, TYPE II   . Diverticulosis   . HYPERLIPIDEMIA   . Macular degeneration of left eye    mild, Dr.Hecker  . Obesity, unspecified   . Osteoarthritis of both knees   . OSTEOPENIA   . Osteopenia   . URINARY INCONTINENCE    Past Surgical History:  Procedure Laterality Date  . BREAST SURGERY    . CESAREAN SECTION  01/1973  . FRACTURE SURGERY    . left wrist surgery  2008   By Dr. Latanya Maudlin  . right ankle  1994   Social History   Social History  . Marital status: Single    Spouse name: N/A  . Number of children: 1  . Years of education: N/A   Occupational History  .  [employer] History Main Topics  . Smoking status: Never Smoker  . Smokeless tobacco: Never Used     Comment: Lives with partner [name]) and son  . Alcohol use No  . Drug use: No  . Sexual activity: No     Comment: Lives with female partner (annette hicks) and 58 yo son   Other Topics Concern  . None   Social History Narrative  . None   Allergies  Allergen Reactions  . Penicillins Anaphylaxis    "serum sickness"  . Aleve [Naproxen Sodium] Swelling    Swelling of face  . Sulfonamide Derivatives Rash   Family History  Problem Relation Age of Onset  . Diabetes Father   . Hyperlipidemia Father   . Heart  disease Father   . Cancer Father   . Hypertension Father   . Colon cancer Paternal Grandmother   . Osteoporosis Mother   . Protein S deficiency Mother   . Hyperlipidemia Mother   . Multiple sclerosis Daughter   . Cancer Unknown        bladder     Past medical history, social, surgical and family history all reviewed in electronic medical record.  No pertanent information unless stated regarding to the chief complaint.   Review of Systems:Review of systems updated and as accurate as of 08/12/17  No headache, visual changes, nausea, vomiting, diarrhea, constipation, dizziness, abdominal pain, skin rash, fevers, chills, night sweats, weight loss, swollen lymph nodes, body aches, joint swelling,  chest pain, shortness of breath, mood changes. Positive muscle aches  Objective  Blood pressure 118/74, pulse 99, height 4\' 11"  (1.499 m), last menstrual period 10/09/2012, SpO2 95 %.   Systems examined below as of 08/12/17 General: NAD A&O x3 mood, affect normal  HEENT: Pupils equal, extraocular movements intact no nystagmus Respiratory: not short of breath at rest or with speaking Cardiovascular: No lower extremity edema, non tender Skin: Warm dry intact with no signs of  infection or rash on extremities or on axial skeleton. Abdomen: Soft nontender, no masses Neuro: Cranial nerves  intact, neurovascularly intact in all extremities with 2+ DTRs and 2+ pulses. Lymph: No lymphadenopathy appreciated today   MSK:  Non tender with full range of motion and good stability and symmetric strength and tone of shoulders, elbows, wrist, hip, knee and ankles bilaterally. Arthritic changes of multiple joints  Left hand exam still shows the patient does have some CMC arthritis but negative grind test today. Swelling that was noted previously, resolved.     Impression and Recommendations:     This case required medical decision making of moderate complexity.      Note: This dictation was prepared  with Dragon dictation along with smaller phrase technology. Any transcriptional errors that result from this process are unintentional.

## 2017-08-17 ENCOUNTER — Encounter: Payer: Self-pay | Admitting: Gastroenterology

## 2017-08-17 ENCOUNTER — Other Ambulatory Visit: Payer: Self-pay | Admitting: Internal Medicine

## 2017-08-17 DIAGNOSIS — Z1231 Encounter for screening mammogram for malignant neoplasm of breast: Secondary | ICD-10-CM

## 2017-10-07 ENCOUNTER — Ambulatory Visit (AMBULATORY_SURGERY_CENTER): Payer: Self-pay

## 2017-10-07 VITALS — Ht 59.0 in | Wt 189.6 lb

## 2017-10-07 DIAGNOSIS — Z860100 Personal history of colon polyps, unspecified: Secondary | ICD-10-CM

## 2017-10-07 MED ORDER — SUPREP BOWEL PREP KIT 17.5-3.13-1.6 GM/177ML PO SOLN: 1.0000 | Freq: Once | ORAL | 0 refills | Status: AC

## 2017-10-07 NOTE — Progress Notes (Signed)
No allergies to eggs or soy No diet meds No home oxygen No past problems with anesthesia  Declined emmi 

## 2017-10-08 ENCOUNTER — Encounter: Payer: Self-pay | Admitting: Gastroenterology

## 2017-10-21 ENCOUNTER — Ambulatory Visit (AMBULATORY_SURGERY_CENTER): Payer: Medicare Other | Admitting: Gastroenterology

## 2017-10-21 ENCOUNTER — Other Ambulatory Visit: Payer: Self-pay

## 2017-10-21 ENCOUNTER — Encounter: Payer: Self-pay | Admitting: Gastroenterology

## 2017-10-21 VITALS — BP 114/63 | HR 69 | Temp 97.3°F | Resp 14 | Ht 59.0 in | Wt 189.0 lb

## 2017-10-21 DIAGNOSIS — D120 Benign neoplasm of cecum: Secondary | ICD-10-CM | POA: Diagnosis not present

## 2017-10-21 DIAGNOSIS — D124 Benign neoplasm of descending colon: Secondary | ICD-10-CM | POA: Diagnosis not present

## 2017-10-21 DIAGNOSIS — D123 Benign neoplasm of transverse colon: Secondary | ICD-10-CM | POA: Diagnosis not present

## 2017-10-21 DIAGNOSIS — Z86010 Personal history of colonic polyps: Secondary | ICD-10-CM

## 2017-10-21 MED ORDER — SODIUM CHLORIDE 0.9 % IV SOLN: 500.0000 mL | INTRAVENOUS | Status: DC

## 2017-10-21 NOTE — Progress Notes (Signed)
Called to room to assist during endoscopic procedure.  Patient ID and intended procedure confirmed with present staff. Received instructions for my participation in the procedure from the performing physician.  

## 2017-10-21 NOTE — Progress Notes (Signed)
Pt's states no medical or surgical changes since previsit or office visit. 

## 2017-10-21 NOTE — Progress Notes (Signed)
Report given to PACU, vss 

## 2017-10-21 NOTE — Op Note (Signed)
[facility] Patient Name: Yenesis Even Procedure Date: 10/21/2017 9:01 AM MRN: [id number] Endoscopist: Mauri Pole , MD Age: 76 Referring MD:  Date of Birth: [birth year] Gender: Female Account #: [id number] Procedure:                Colonoscopy Indications:              High risk colon cancer surveillance: Personal                            history of colonic polyps, Surveillance: Personal                            history of adenomatous polyps on last colonoscopy 5                            years ago Medicines:                Monitored Anesthesia Care Procedure:                Pre-Anesthesia Assessment:                           - Prior to the procedure, a History and Physical                            was performed, and patient medications and                            allergies were reviewed. The patient's tolerance of                            previous anesthesia was also reviewed. The risks                            and benefits of the procedure and the sedation                            options and risks were discussed with the patient.                            All questions were answered, and informed consent                            was obtained. Prior Anticoagulants: The patient has                            taken no previous anticoagulant or antiplatelet                            agents. ASA Grade Assessment: II - A patient with                            mild systemic disease. After reviewing the risks  and benefits, the patient was deemed in                            satisfactory condition to undergo the procedure.                           After obtaining informed consent, the colonoscope                            was passed under direct vision. Throughout the                            procedure, the patient's blood pressure, pulse, and                            oxygen saturations were monitored continuously. The                             Model PCF-[id number]) scope was introduced                            through the anus and advanced to the the cecum,                            identified by appendiceal orifice and ileocecal                            valve. The colonoscopy was performed without                            difficulty. The patient tolerated the procedure                            well. The quality of the bowel preparation was                            good. The ileocecal valve, appendiceal orifice, and                            rectum were photographed. Scope In: 9:10:57 AM Scope Out: 9:25:08 AM Scope Withdrawal Time: 0 hours 12 minutes 36 seconds  Total Procedure Duration: 0 hours 14 minutes 11 seconds  Findings:                 The perianal and digital rectal examinations were                            normal.                           Two sessile polyps were found in the descending                            colon and cecum. The polyps were 2 to 3 mm in size.  These polyps were removed with a cold biopsy                            forceps. Resection and retrieval were complete.                           Two sessile polyps were found in the descending                            colon and transverse colon. The polyps were 4 to 6                            mm in size. These polyps were removed with a cold                            snare. Resection and retrieval were complete.                           Non-bleeding internal hemorrhoids were found during                            retroflexion. The hemorrhoids were small.                           The exam was otherwise without abnormality. Complications:            No immediate complications. Estimated Blood Loss:     Estimated blood loss was minimal. Impression:               - Two 2 to 3 mm polyps in the descending colon and                            in the cecum, removed with a cold  biopsy forceps.                            Resected and retrieved.                           - Two 4 to 6 mm polyps in the descending colon and                            in the transverse colon, removed with a cold snare.                            Resected and retrieved.                           - Non-bleeding internal hemorrhoids.                           - The examination was otherwise normal. Recommendation:           - Patient has a contact number available for  emergencies. The signs and symptoms of potential                            delayed complications were discussed with the                            patient. Return to normal activities tomorrow.                            Written discharge instructions were provided to the                            patient.                           - Resume previous diet.                           - Continue present medications.                           - Await pathology results.                           - Repeat colonoscopy in 3 - 5 years for                            surveillance based on pathology results. Mauri Pole, MD 10/21/2017 9:30:31 AM This report has been signed electronically.

## 2017-10-21 NOTE — Patient Instructions (Signed)
YOU HAD AN ENDOSCOPIC PROCEDURE TODAY AT THE [facility]:   Refer to the procedure report that was given to you for any specific questions about what was found during the examination.  If the procedure report does not answer your questions, please call your gastroenterologist to clarify.  If you requested that your care partner not be given the details of your procedure findings, then the procedure report has been included in a sealed envelope for you to review at your convenience later.  YOU SHOULD EXPECT: Some feelings of bloating in the abdomen. Passage of more gas than usual.  Walking can help get rid of the air that was put into your GI tract during the procedure and reduce the bloating. If you had a lower endoscopy (such as a colonoscopy or flexible sigmoidoscopy) you may notice spotting of blood in your stool or on the toilet paper. If you underwent a bowel prep for your procedure, you may not have a normal bowel movement for a few days.  Please Note:  You might notice some irritation and congestion in your nose or some drainage.  This is from the oxygen used during your procedure.  There is no need for concern and it should clear up in a day or so.  SYMPTOMS TO REPORT IMMEDIATELY:   Following lower endoscopy (colonoscopy or flexible sigmoidoscopy):  Excessive amounts of blood in the stool  Significant tenderness or worsening of abdominal pains  Swelling of the abdomen that is new, acute  Fever of 100F or higher  For urgent or emergent issues, a gastroenterologist can be reached at any hour by calling [phone].   DIET:  We do recommend a small meal at first, but then you may proceed to your regular diet.  Drink plenty of fluids but you should avoid alcoholic beverages for 24 hours.  MEDICATIONS: Continue present medications.  Please see handouts given to you by your recovery nurse.  ACTIVITY:  You should plan to take it easy for the rest of today and you should NOT  DRIVE or use heavy machinery until tomorrow (because of the sedation medicines used during the test).    FOLLOW UP: Our staff will call the number listed on your records the next business day following your procedure to check on you and address any questions or concerns that you may have regarding the information given to you following your procedure. If we do not reach you, we will leave a message.  However, if you are feeling well and you are not experiencing any problems, there is no need to return our call.  We will assume that you have returned to your regular daily activities without incident.  If any biopsies were taken you will be contacted by phone or by letter within the next 1-3 weeks.  Please call us at [phone] if you have not heard about the biopsies in 3 weeks.   Thank you for allowing us to provide for your healthcare needs today.   SIGNATURES/CONFIDENTIALITY: You and/or your care partner have signed paperwork which will be entered into your electronic medical record.  These signatures attest to the fact that that the information above on your After Visit Summary has been reviewed and is understood.  Full responsibility of the confidentiality of this discharge information lies with you and/or your care-partner. 

## 2017-10-22 ENCOUNTER — Telehealth: Payer: Self-pay | Admitting: *Deleted

## 2017-10-22 NOTE — Telephone Encounter (Signed)
  Follow up Call-  Call back number 10/21/2017  Post procedure Call Back phone  # [phone]  Permission to leave phone message Yes  Some recent data might be hidden     Patient questions:  Do you have a fever, pain , or abdominal swelling? No. Pain Score  0 *  Have you tolerated food without any problems? Yes.    Have you been able to return to your normal activities? Yes.    Do you have any questions about your discharge instructions: Diet   No. Medications  No. Follow up visit  No.  Do you have questions or concerns about your Care? No.  Actions: * If pain score is 4 or above: No action needed, pain <4.

## 2017-10-25 ENCOUNTER — Encounter: Payer: Self-pay | Admitting: Internal Medicine

## 2017-10-25 ENCOUNTER — Other Ambulatory Visit (INDEPENDENT_AMBULATORY_CARE_PROVIDER_SITE_OTHER): Payer: Medicare Other

## 2017-10-25 ENCOUNTER — Ambulatory Visit: Payer: Medicare Other | Admitting: Internal Medicine

## 2017-10-25 VITALS — BP 130/70 | HR 94 | Temp 98.1°F | Ht 59.0 in | Wt 187.0 lb

## 2017-10-25 DIAGNOSIS — E785 Hyperlipidemia, unspecified: Secondary | ICD-10-CM

## 2017-10-25 DIAGNOSIS — E119 Type 2 diabetes mellitus without complications: Secondary | ICD-10-CM

## 2017-10-25 DIAGNOSIS — J4520 Mild intermittent asthma, uncomplicated: Secondary | ICD-10-CM

## 2017-10-25 DIAGNOSIS — F320 Major depressive disorder, single episode, mild: Secondary | ICD-10-CM | POA: Diagnosis not present

## 2017-10-25 DIAGNOSIS — E1169 Type 2 diabetes mellitus with other specified complication: Secondary | ICD-10-CM

## 2017-10-25 DIAGNOSIS — Z0000 Encounter for general adult medical examination without abnormal findings: Secondary | ICD-10-CM

## 2017-10-25 LAB — COMPREHENSIVE METABOLIC PANEL
ALBUMIN: 4 g/dL (ref 3.5–5.2)
ALT: 76 U/L — ABNORMAL HIGH (ref 0–35)
AST: 80 U/L — AB (ref 0–37)
Alkaline Phosphatase: 83 U/L (ref 39–117)
BUN: 17 mg/dL (ref 6–23)
CHLORIDE: 104 meq/L (ref 96–112)
CO2: 21 meq/L (ref 19–32)
CREATININE: 0.58 mg/dL (ref 0.40–1.20)
Calcium: 10.2 mg/dL (ref 8.4–10.5)
GFR: 107.92 mL/min (ref >60.00)
Glucose, Bld: 177 mg/dL — ABNORMAL HIGH (ref 70–99)
POTASSIUM: 3.7 meq/L (ref 3.5–5.1)
SODIUM: 137 meq/L (ref 135–145)
Total Bilirubin: 0.4 mg/dL (ref 0.2–1.2)
Total Protein: 6.7 g/dL (ref 6.0–8.3)

## 2017-10-25 LAB — MICROALBUMIN / CREATININE URINE RATIO
CREATININE, U: 123.8 mg/dL
MICROALB UR: 1 mg/dL (ref 0.0–1.9)
Microalb Creat Ratio: 0.8 mg/g (ref 0.0–30.0)

## 2017-10-25 LAB — HEMOGLOBIN A1C: HEMOGLOBIN A1C: 7.6 % — AB (ref 4.6–6.5)

## 2017-10-25 NOTE — Progress Notes (Signed)
   Subjective:    Patient ID: Faith Orr, female    DOB: Nov 07, 1941, 76 y.o.   MRN: [id number]  HPI  Here for medicare wellness and physical, no new complaints. Please see A/P for status and treatment of chronic medical problems.   Diet: DM since diabetic Physical activity: sedentary Depression/mood screen: negative Hearing: intact to whispered voice Visual acuity: grossly normal, performs annual eye exam  ADLs: capable Fall risk: low Home safety: good Cognitive evaluation: intact to orientation, naming, recall and repetition EOL planning: adv directives discussed  I have personally reviewed and have noted 1. The patient's medical and social history - reviewed today no changes 2. Their use of alcohol, tobacco or illicit drugs 3. Their current medications and supplements 4. The patient's functional ability including ADL's, fall risks, home safety risks and hearing or visual impairment. 5. Diet and physical activities 6. Evidence for depression or mood disorders 7. Care team reviewed and updated (available in snapshot)  Review of Systems  Constitutional: Negative.   HENT: Negative.   Eyes: Negative.   Respiratory: Negative for cough, chest tightness and shortness of breath.   Cardiovascular: Negative for chest pain, palpitations and leg swelling.  Gastrointestinal: Negative for abdominal distention, abdominal pain, constipation, diarrhea, nausea and vomiting.  Musculoskeletal: Negative.   Skin: Negative.   Neurological: Negative.   Psychiatric/Behavioral: Negative.       Objective:   Physical Exam  Constitutional: She is oriented to person, place, and time. She appears well-developed and well-nourished.  Overweight  HENT:  Head: Normocephalic and atraumatic.  Eyes: EOM are normal.  Neck: Normal range of motion.  Cardiovascular: Normal rate and regular rhythm.  Pulmonary/Chest: Effort normal and breath sounds normal. No respiratory distress. She has no wheezes. She has  no rales.  Abdominal: Soft. Bowel sounds are normal. She exhibits no distension. There is no tenderness. There is no rebound.  Musculoskeletal: She exhibits no edema.  Neurological: She is alert and oriented to person, place, and time. Coordination normal.  Skin: Skin is warm and dry.  Psychiatric: She has a normal mood and affect.   Vitals:   10/25/17 1428  BP: 130/70  Pulse: 94  Temp: 98.1 F (36.7 C)  TempSrc: Oral  SpO2: 98%  Weight: 187 lb (84.8 kg)  Height: 4\' 11"  (1.499 m)      Assessment & Plan:

## 2017-10-25 NOTE — Patient Instructions (Signed)
We will check the labs today and call you back about the results.  Ask the pharmacist about shingrix which is the shingles vaccine. This is 2 shots about 2 months apart.   Health Maintenance, Female Adopting a healthy lifestyle and getting preventive care can go a long way to promote health and wellness. Talk with your health care provider about what schedule of regular examinations is right for you. This is a good chance for you to check in with your provider about disease prevention and staying healthy. In between checkups, there are plenty of things you can do on your own. Experts have done a lot of research about which lifestyle changes and preventive measures are most likely to keep you healthy. Ask your health care provider for more information. Weight and diet Eat a healthy diet  Be sure to include plenty of vegetables, fruits, low-fat dairy products, and lean protein.  Do not eat a lot of foods high in solid fats, added sugars, or salt.  Get regular exercise. This is one of the most important things you can do for your health. ? Most adults should exercise for at least 150 minutes each week. The exercise should increase your heart rate and make you sweat (moderate-intensity exercise). ? Most adults should also do strengthening exercises at least twice a week. This is in addition to the moderate-intensity exercise.  Maintain a healthy weight  Body mass index (BMI) is a measurement that can be used to identify possible weight problems. It estimates body fat based on height and weight. Your health care provider can help determine your BMI and help you achieve or maintain a healthy weight.  For females 39 years of age and older: ? A BMI below 18.5 is considered underweight. ? A BMI of 18.5 to 24.9 is normal. ? A BMI of 25 to 29.9 is considered overweight. ? A BMI of 30 and above is considered obese.  Watch levels of cholesterol and blood lipids  You should start having your blood  tested for lipids and cholesterol at 75 years of age, then have this test every 5 years.  You may need to have your cholesterol levels checked more often if: ? Your lipid or cholesterol levels are high. ? You are older than 74 years of age. ? You are at high risk for heart disease.  Cancer screening Lung Cancer  Lung cancer screening is recommended for adults 40-66 years old who are at high risk for lung cancer because of a history of smoking.  A yearly low-dose CT scan of the lungs is recommended for people who: ? Currently smoke. ? Have quit within the past 15 years. ? Have at least a 30-pack-year history of smoking. A pack year is smoking an average of one pack of cigarettes a day for 1 year.  Yearly screening should continue until it has been 15 years since you quit.  Yearly screening should stop if you develop a health problem that would prevent you from having lung cancer treatment.  Breast Cancer  Practice breast self-awareness. This means understanding how your breasts normally appear and feel.  It also means doing regular breast self-exams. Let your health care provider know about any changes, no matter how small.  If you are in your 20s or 30s, you should have a clinical breast exam (CBE) by a health care provider every 1-3 years as part of a regular health exam.  If you are 28 or older, have a CBE every year. Also  consider having a breast X-ray (mammogram) every year.  If you have a family history of breast cancer, talk to your health care provider about genetic screening.  If you are at high risk for breast cancer, talk to your health care provider about having an MRI and a mammogram every year.  Breast cancer gene (BRCA) assessment is recommended for women who have family members with BRCA-related cancers. BRCA-related cancers include: ? Breast. ? Ovarian. ? Tubal. ? Peritoneal cancers.  Results of the assessment will determine the need for genetic counseling and  BRCA1 and BRCA2 testing.  Cervical Cancer Your health care provider may recommend that you be screened regularly for cancer of the pelvic organs (ovaries, uterus, and vagina). This screening involves a pelvic examination, including checking for microscopic changes to the surface of your cervix (Pap test). You may be encouraged to have this screening done every 3 years, beginning at age 46.  For women ages 39-65, health care providers may recommend pelvic exams and Pap testing every 3 years, or they may recommend the Pap and pelvic exam, combined with testing for human papilloma virus (HPV), every 5 years. Some types of HPV increase your risk of cervical cancer. Testing for HPV may also be done on women of any age with unclear Pap test results.  Other health care providers may not recommend any screening for nonpregnant women who are considered low risk for pelvic cancer and who do not have symptoms. Ask your health care provider if a screening pelvic exam is right for you.  If you have had past treatment for cervical cancer or a condition that could lead to cancer, you need Pap tests and screening for cancer for at least 20 years after your treatment. If Pap tests have been discontinued, your risk factors (such as having a new sexual partner) need to be reassessed to determine if screening should resume. Some women have medical problems that increase the chance of getting cervical cancer. In these cases, your health care provider may recommend more frequent screening and Pap tests.  Colorectal Cancer  This type of cancer can be detected and often prevented.  Routine colorectal cancer screening usually begins at 74 years of age and continues through 74 years of age.  Your health care provider may recommend screening at an earlier age if you have risk factors for colon cancer.  Your health care provider may also recommend using home test kits to check for hidden blood in the stool.  A small camera  at the end of a tube can be used to examine your colon directly (sigmoidoscopy or colonoscopy). This is done to check for the earliest forms of colorectal cancer.  Routine screening usually begins at age 33.  Direct examination of the colon should be repeated every 5-10 years through 74 years of age. However, you may need to be screened more often if early forms of precancerous polyps or small growths are found.  Skin Cancer  Check your skin from head to toe regularly.  Tell your health care provider about any new moles or changes in moles, especially if there is a change in a mole's shape or color.  Also tell your health care provider if you have a mole that is larger than the size of a pencil eraser.  Always use sunscreen. Apply sunscreen liberally and repeatedly throughout the day.  Protect yourself by wearing long sleeves, pants, a wide-brimmed hat, and sunglasses whenever you are outside.  Heart disease, diabetes, and high blood  pressure  High blood pressure causes heart disease and increases the risk of stroke. High blood pressure is more likely to develop in: ? People who have blood pressure in the high end of the normal range (130-139/85-89 mm Hg). ? People who are overweight or obese. ? People who are African American.  If you are 18-39 years of age, have your blood pressure checked every 3-5 years. If you are 40 years of age or older, have your blood pressure checked every year. You should have your blood pressure measured twice-once when you are at a hospital or clinic, and once when you are not at a hospital or clinic. Record the average of the two measurements. To check your blood pressure when you are not at a hospital or clinic, you can use: ? An automated blood pressure machine at a pharmacy. ? A home blood pressure monitor.  If you are between 55 years and 79 years old, ask your health care provider if you should take aspirin to prevent strokes.  Have regular diabetes  screenings. This involves taking a blood sample to check your fasting blood sugar level. ? If you are at a normal weight and have a low risk for diabetes, have this test once every three years after 74 years of age. ? If you are overweight and have a high risk for diabetes, consider being tested at a younger age or more often. Preventing infection Hepatitis B  If you have a higher risk for hepatitis B, you should be screened for this virus. You are considered at high risk for hepatitis B if: ? You were born in a country where hepatitis B is common. Ask your health care provider which countries are considered high risk. ? Your parents were born in a high-risk country, and you have not been immunized against hepatitis B (hepatitis B vaccine). ? You have HIV or AIDS. ? You use needles to inject street drugs. ? You live with someone who has hepatitis B. ? You have had sex with someone who has hepatitis B. ? You get hemodialysis treatment. ? You take certain medicines for conditions, including cancer, organ transplantation, and autoimmune conditions.  Hepatitis C  Blood testing is recommended for: ? Everyone born from 1945 through 1965. ? Anyone with known risk factors for hepatitis C.  Sexually transmitted infections (STIs)  You should be screened for sexually transmitted infections (STIs) including gonorrhea and chlamydia if: ? You are sexually active and are younger than 74 years of age. ? You are older than 74 years of age and your health care provider tells you that you are at risk for this type of infection. ? Your sexual activity has changed since you were last screened and you are at an increased risk for chlamydia or gonorrhea. Ask your health care provider if you are at risk.  If you do not have HIV, but are at risk, it may be recommended that you take a prescription medicine daily to prevent HIV infection. This is called pre-exposure prophylaxis (PrEP). You are considered at risk  if: ? You are sexually active and do not regularly use condoms or know the HIV status of your partner(s). ? You take drugs by injection. ? You are sexually active with a partner who has HIV.  Talk with your health care provider about whether you are at high risk of being infected with HIV. If you choose to begin PrEP, you should first be tested for HIV. You should then be tested every   3 months for as long as you are taking PrEP. Pregnancy  If you are premenopausal and you may become pregnant, ask your health care provider about preconception counseling.  If you may become pregnant, take 400 to 800 micrograms (mcg) of folic acid every day.  If you want to prevent pregnancy, talk to your health care provider about birth control (contraception). Osteoporosis and menopause  Osteoporosis is a disease in which the bones lose minerals and strength with aging. This can result in serious bone fractures. Your risk for osteoporosis can be identified using a bone density scan.  If you are 65 years of age or older, or if you are at risk for osteoporosis and fractures, ask your health care provider if you should be screened.  Ask your health care provider whether you should take a calcium or vitamin D supplement to lower your risk for osteoporosis.  Menopause may have certain physical symptoms and risks.  Hormone replacement therapy may reduce some of these symptoms and risks. Talk to your health care provider about whether hormone replacement therapy is right for you. Follow these instructions at home:  Schedule regular health, dental, and eye exams.  Stay current with your immunizations.  Do not use any tobacco products including cigarettes, chewing tobacco, or electronic cigarettes.  If you are pregnant, do not drink alcohol.  If you are breastfeeding, limit how much and how often you drink alcohol.  Limit alcohol intake to no more than 1 drink per day for nonpregnant women. One drink  equals 12 ounces of beer, 5 ounces of wine, or 1 ounces of hard liquor.  Do not use street drugs.  Do not share needles.  Ask your health care provider for help if you need support or information about quitting drugs.  Tell your health care provider if you often feel depressed.  Tell your health care provider if you have ever been abused or do not feel safe at home. This information is not intended to replace advice given to you by your health care provider. Make sure you discuss any questions you have with your health care provider. Document Released: 06/15/2011 Document Revised: 05/07/2016 Document Reviewed: 09/03/2015 Elsevier Interactive Patient Education  2018 [employer]  

## 2017-10-26 NOTE — Assessment & Plan Note (Signed)
Recent lipid panel at goal.  

## 2017-10-26 NOTE — Assessment & Plan Note (Signed)
BMI 37 but complicated by diabetes and hyperlipidemia and osteoarthritis. Counseled on the need to continue with weight loss and goal down 5 pounds in 6 months at follow up here. On metformin.

## 2017-10-26 NOTE — Assessment & Plan Note (Signed)
Taking zoloft with good results and will continue.

## 2017-10-26 NOTE — Assessment & Plan Note (Signed)
Foot exam done, microalbumin to creatinine ratio and check HgA1c.Adjust he metformin as needed. Not complicated.

## 2017-10-26 NOTE — Assessment & Plan Note (Signed)
Rare use of albuterol, mostly sinus triggered and using singulair and otc allergy meds.

## 2017-10-26 NOTE — Assessment & Plan Note (Signed)
Colonoscopy and mammogram up to date. Counseled about shingrix.Flu and tetanus and pneumonia up to date. Counseled about sun safety and mole surveillance. Given 10 year screening recommendations.

## 2017-10-27 ENCOUNTER — Encounter: Payer: Self-pay | Admitting: Internal Medicine

## 2017-10-27 DIAGNOSIS — M25532 Pain in left wrist: Secondary | ICD-10-CM

## 2017-10-28 ENCOUNTER — Encounter: Payer: Self-pay | Admitting: Internal Medicine

## 2017-11-01 ENCOUNTER — Encounter: Payer: Self-pay | Admitting: Gastroenterology

## 2017-11-02 ENCOUNTER — Other Ambulatory Visit (INDEPENDENT_AMBULATORY_CARE_PROVIDER_SITE_OTHER): Payer: Medicare Other

## 2017-11-02 DIAGNOSIS — M25532 Pain in left wrist: Secondary | ICD-10-CM | POA: Diagnosis not present

## 2017-11-02 LAB — URIC ACID: Uric Acid, Serum: 6.7 mg/dL (ref 2.4–7.0)

## 2017-11-16 ENCOUNTER — Other Ambulatory Visit: Payer: Self-pay | Admitting: Internal Medicine

## 2017-11-18 ENCOUNTER — Ambulatory Visit
Admission: RE | Admit: 2017-11-18 | Discharge: 2017-11-18 | Disposition: A | Discharge status: Home / Self Care | Payer: Medicare Other | Source: Ambulatory Visit | Attending: Internal Medicine | Admitting: Internal Medicine

## 2017-11-18 DIAGNOSIS — Z1231 Encounter for screening mammogram for malignant neoplasm of breast: Secondary | ICD-10-CM

## 2017-11-19 ENCOUNTER — Other Ambulatory Visit: Payer: Self-pay | Admitting: Internal Medicine

## 2018-01-10 ENCOUNTER — Other Ambulatory Visit: Payer: Self-pay | Admitting: Internal Medicine

## 2018-03-08 ENCOUNTER — Encounter: Payer: Self-pay | Admitting: Internal Medicine

## 2018-03-09 ENCOUNTER — Other Ambulatory Visit: Payer: Self-pay | Admitting: Internal Medicine

## 2018-03-09 ENCOUNTER — Other Ambulatory Visit: Payer: Self-pay

## 2018-03-09 MED ORDER — ALBUTEROL SULFATE HFA 108 (90 BASE) MCG/ACT IN AERS: 1.0000 | INHALATION_SPRAY | Freq: Four times a day (QID) | RESPIRATORY_TRACT | 1 refills | Status: DC | PRN

## 2018-03-09 MED ORDER — FLUTICASONE PROPIONATE 50 MCG/ACT NA SUSP: 1.0000 | Freq: Every day | NASAL | 2 refills | Status: DC

## 2018-04-04 LAB — HM DIABETES EYE EXAM

## 2018-04-05 ENCOUNTER — Encounter: Payer: Self-pay | Admitting: Internal Medicine

## 2018-05-04 ENCOUNTER — Ambulatory Visit (INDEPENDENT_AMBULATORY_CARE_PROVIDER_SITE_OTHER): Payer: Medicare Other | Admitting: Obstetrics and Gynecology

## 2018-05-04 ENCOUNTER — Encounter: Payer: Self-pay | Admitting: Obstetrics and Gynecology

## 2018-05-04 ENCOUNTER — Other Ambulatory Visit: Payer: Self-pay

## 2018-05-04 VITALS — BP 132/72 | HR 90 | Resp 16 | Ht 59.0 in | Wt 181.6 lb

## 2018-05-04 DIAGNOSIS — Z01419 Encounter for gynecological examination (general) (routine) without abnormal findings: Secondary | ICD-10-CM | POA: Diagnosis not present

## 2018-05-04 DIAGNOSIS — R350 Frequency of micturition: Secondary | ICD-10-CM

## 2018-05-04 LAB — POCT URINALYSIS DIPSTICK
BILIRUBIN UA: NEGATIVE
Blood, UA: NEGATIVE
GLUCOSE UA: NEGATIVE
KETONES UA: NEGATIVE
Leukocytes, UA: NEGATIVE
Nitrite, UA: NEGATIVE
Protein, UA: NEGATIVE
Urobilinogen, UA: 0.2 E.U./dL
pH, UA: 5 (ref 5.0–8.0)

## 2018-05-04 NOTE — Progress Notes (Signed)
77 y.o. G1P1 Single Caucasian female here for annual exam.    No vaginal bleeding or spotting.   Urinary frequency and urgency.  Frequency is worsening.  Loss of urine spontaneously and with sneeze and cough.  Night time urination - usually none.  DF - depends. On average 10 times per day.   Hgb A1C 7.6 in November 2018.   Drinks tea infrequently.   No HTN.   Has some elevated pressure in her eye, which resolved.  No hx arrhythmia.  Having cataract surgery May 24, 2018 and then in July, 2019.   Mother passed at age 37.   PCP:   Pricilla Holm, MD  Patient's last menstrual period was 10/09/2012.           Sexually active: No. Female partner The current method of family planning is postmenopausal.    Exercising: Yes.    gardening and water aerobics. Smoker:  no  Health Maintenance: Pap: 04-30-17 VFI:[id number] estrogen level, 04-29-15 Neg History of abnormal Pap:  no MMG: 11-18-17 Density B/Neg/BiRads1 Colonoscopy: 10/2012 tubular adenoma;10/2017 - normal, due in 3 years. Dr. Silverio Decamp. BMD:   06-03-15  Result  normal TDaP: Td 04-18-10 Gardasil:   no HIV: donated blood Hep C:donated blood Screening Labs:  Hb today: PCP   reports that she has never smoked. She has never used smokeless tobacco. She reports that she does not drink alcohol or use drugs.  Past Medical History:  Diagnosis Date  . Allergy    seasonal  . Asthma   . DEPRESSION   . DIABETES MELLITUS, TYPE II   . Diverticulosis   . HYPERLIPIDEMIA   . Macular degeneration of left eye    mild, Dr.Hecker  . Obesity, unspecified   . Osteoarthritis of both knees   . OSTEOPENIA   . Osteopenia   . URINARY INCONTINENCE     Past Surgical History:  Procedure Laterality Date  . BREAST SURGERY    . CESAREAN SECTION  01/1973  . FRACTURE SURGERY    . left wrist surgery  2008   By Dr. Latanya Maudlin  . right ankle  1994    Current Outpatient Medications  Medication Sig Dispense Refill  . albuterol (PROVENTIL  HFA;VENTOLIN HFA) 108 (90 Base) MCG/ACT inhaler INHALE 1 TO 2 PUFFS INTO THE LUNGS EVERY 6 HOURS AS NEEDED FOR WHEEZING OR SHORTNESS OF BREATH 54 g 1  . Blood Glucose Monitoring Suppl (FREESTYLE FREEDOM LITE) W/DEVICE KIT Use to check blood sugars twice a day Dx 250.00 1 each 0  . Calcium Carbonate-Vitamin D (CALCIUM 600+D HIGH POTENCY) 600-400 MG-UNIT per tablet Take 1 tablet by mouth 2 (two) times daily.     Marland Kitchen doxycycline (PERIOSTAT) 20 MG tablet Take 1 tablet by mouth as needed.  2  . fluticasone (FLONASE) 50 MCG/ACT nasal spray Place 1 spray into both nostrils daily. 16 g 2  . glucose blood (FREESTYLE LITE) test strip CHECK BLOOD SUGAR TWICE DAILY AS DIRECTED Dx 250.00 180 each 3  . Lancets (FREESTYLE) lancets Use twice daily to check sugars. 100 each 11  . metFORMIN (GLUCOPHAGE-XR) 500 MG 24 hr tablet TAKE 1 TABLET BY MOUTH TWICE DAILY 180 tablet 1  . montelukast (SINGULAIR) 10 MG tablet TAKE 1 TABLET BY MOUTH DAILY AS NEEDED 30 tablet 3  . Multiple Vitamins-Minerals (ICAPS) CAPS Take 1 capsule by mouth daily.      . pantoprazole (PROTONIX) 20 MG tablet TAKE 1 TABLET(20 MG) BY MOUTH DAILY 30 tablet 5  . sertraline (ZOLOFT) 50  MG tablet TAKE 1 TABLET BY MOUTH DAILY 90 tablet 3  . simvastatin (ZOCOR) 20 MG tablet TAKE 1 TABLET(20 MG) BY MOUTH DAILY 90 tablet 3   No current facility-administered medications for this visit.     Family History  Problem Relation Age of Onset  . Diabetes Father   . Hyperlipidemia Father   . Heart disease Father   . Cancer Father   . Hypertension Father   . Colon cancer Paternal Grandmother 47  . Osteoporosis Mother   . Protein S deficiency Mother   . Hyperlipidemia Mother   . Multiple sclerosis Daughter   . Cancer Unknown        bladder  . Breast cancer Neg Hx     Review of Systems  Constitutional: Negative.   HENT: Positive for hearing loss.   Eyes: Negative.   Respiratory: Negative.   Cardiovascular: Negative.   Gastrointestinal: Negative.    Endocrine: Negative.   Genitourinary: Positive for frequency and urgency.  Skin: Negative.   Allergic/Immunologic: Negative.   Neurological: Negative.   Hematological: Negative.   Psychiatric/Behavioral: Negative.     Exam:   BP 132/72 (BP Location: Right Arm, Patient Position: Sitting, Cuff Size: Normal)   Pulse 90   Resp 16   Ht '4\' 11"'  (1.499 m)   Wt 181 lb 9.6 oz (82.4 kg)   LMP 10/09/2012   BMI 36.68 kg/m     General appearance: alert, cooperative and appears stated age Head: Normocephalic, without obvious abnormality, atraumatic Neck: no adenopathy, supple, symmetrical, trachea midline and thyroid normal to inspection and palpation Lungs: clear to auscultation bilaterally Breasts: normal appearance, no masses or tenderness, No nipple retraction or dimpling, No nipple discharge or bleeding, No axillary or supraclavicular adenopathy Heart: regular rate and rhythm Abdomen: soft, non-tender; no masses, no organomegaly Extremities: extremities normal, atraumatic, no cyanosis or edema Skin: Skin color, texture, turgor normal. No rashes or lesions Lymph nodes: Cervical, supraclavicular, and axillary nodes normal. No abnormal inguinal nodes palpated Neurologic: Grossly normal  Pelvic: External genitalia:  no lesions              Urethra:  normal appearing urethra with no masses, tenderness or lesions              Bartholins and Skenes: normal                 Vagina: normal appearing vagina with normal color and discharge, no lesions              Cervix: no lesions              Pap taken:  No. Bimanual Exam:  Uterus:  normal size, contour, position, consistency, mobility, non-tender              Adnexa: no mass, fullness, tenderness              Rectal exam: Yes.  .  Confirms.              Anus:  normal sphincter tone, no lesions  Chaperone was present for exam.  Assessment:   Well woman visit with normal exam. Elevated eye pressure - resolved with recheck.    Plan: Mammogram screening. Recommended self breast awareness. Pap and HR HPV as above. Guidelines for Calcium, Vitamin D, regular exercise program including cardiovascular and weight bearing exercise. Will check urine dip - neg We discussed overactive bladder.  May consider PT after her surgeries are done.  Declines medication for overactive  bladder.  Follow up annually and prn.   After visit summary provided.

## 2018-05-04 NOTE — Progress Notes (Signed)
Opened in error

## 2018-05-04 NOTE — Patient Instructions (Signed)
EXERCISE AND DIET:  We recommended that you start or continue a regular exercise program for good health. Regular exercise means any activity that makes your heart beat faster and makes you sweat.  We recommend exercising at least 30 minutes per day at least 3 days a week, preferably 4 or 5.  We also recommend a diet low in fat and sugar.  Inactivity, poor dietary choices and obesity can cause diabetes, heart attack, stroke, and kidney damage, among others.    ALCOHOL AND SMOKING:  Women should limit their alcohol intake to no more than 7 drinks/beers/glasses of wine (combined, not each!) per week. Moderation of alcohol intake to this level decreases your risk of breast cancer and liver damage. And of course, no recreational drugs are part of a healthy lifestyle.  And absolutely no smoking or even second hand smoke. Most people know smoking can cause heart and lung diseases, but did you know it also contributes to weakening of your bones? Aging of your skin?  Yellowing of your teeth and nails?  CALCIUM AND VITAMIN D:  Adequate intake of calcium and Vitamin D are recommended.  The recommendations for exact amounts of these supplements seem to change often, but generally speaking 600 mg of calcium (either carbonate or citrate) and 800 units of Vitamin D per day seems prudent. Certain women may benefit from higher intake of Vitamin D.  If you are among these women, your doctor will have told you during your visit.    PAP SMEARS:  Pap smears, to check for cervical cancer or precancers,  have traditionally been done yearly, although recent scientific advances have shown that most women can have pap smears less often.  However, every woman still should have a physical exam from her gynecologist every year. It will include a breast check, inspection of the vulva and vagina to check for abnormal growths or skin changes, a visual exam of the cervix, and then an exam to evaluate the size and shape of the uterus and  ovaries.  And after 75 years of age, a rectal exam is indicated to check for rectal cancers. We will also provide age appropriate advice regarding health maintenance, like when you should have certain vaccines, screening for sexually transmitted diseases, bone density testing, colonoscopy, mammograms, etc.   MAMMOGRAMS:  All women over 40 years old should have a yearly mammogram. Many facilities now offer a "3D" mammogram, which may cost around $50 extra out of pocket. If possible,  we recommend you accept the option to have the 3D mammogram performed.  It both reduces the number of women who will be called back for extra views which then turn out to be normal, and it is better than the routine mammogram at detecting truly abnormal areas.    COLONOSCOPY:  Colonoscopy to screen for colon cancer is recommended for all women at age 50.  We know, you hate the idea of the prep.  We agree, BUT, having colon cancer and not knowing it is worse!!  Colon cancer so often starts as a polyp that can be seen and removed at colonscopy, which can quite literally save your life!  And if your first colonoscopy is normal and you have no family history of colon cancer, most women don't have to have it again for 10 years.  Once every ten years, you can do something that may end up saving your life, right?  We will be happy to help you get it scheduled when you are ready.    Be sure to check your insurance coverage so you understand how much it will cost.  It may be covered as a preventative service at no cost, but you should check your particular policy.      Overactive Bladder, Adult Overactive bladder is a group of urinary symptoms. With overactive bladder, you may suddenly feel the need to pass urine (urinate) right away. After feeling this sudden urge, you might also leak urine if you cannot get to the bathroom fast enough (urinary incontinence). These symptoms might interfere with your daily work or social activities.  Overactive bladder symptoms may also wake you up at night. Overactive bladder affects the nerve signals between your bladder and your brain. Your bladder may get the signal to empty before it is full. Very sensitive muscles can also make your bladder squeeze too soon. What are the causes? Many things can cause an overactive bladder. Possible causes include:  Urinary tract infection.  Infection of nearby tissues, such as the prostate.  Prostate enlargement.  Being pregnant with twins or more (multiples).  Surgery on the uterus or urethra.  Bladder stones, inflammation, or tumors.  Drinking too much caffeine or alcohol.  Certain medicines, especially those that you take to help your body get rid of extra fluid (diuretics) by increasing urine production.  Muscle or nerve weakness, especially from: ? A spinal cord injury. ? Stroke. ? Multiple sclerosis. ? Parkinson disease.  Diabetes. This can cause a high urine volume that fills the bladder so quickly that the normal urge to urinate is triggered very strongly.  Constipation. A buildup of too much stool can put pressure on your bladder.  What increases the risk? You may be at greater risk for overactive bladder if you:  Are an older adult.  Smoke.  Are going through menopause.  Have prostate problems.  Have a neurological disease, such as stroke, dementia, Parkinson disease, or multiple sclerosis (MS).  Eat or drink things that irritate the bladder. These include alcohol, spicy food, and caffeine.  Are overweight or obese.  What are the signs or symptoms? The signs and symptoms of an overactive bladder include:  Sudden, strong urges to urinate.  Leaking urine.  Urinating eight or more times per day.  Waking up to urinate two or more times per night.  How is this diagnosed? Your health care provider may suspect overactive bladder based on your symptoms. The health care provider will do a physical exam and take  your medical history. Blood or urine tests may also be done. For example, you might need to have a bladder function test to check how well you can hold your urine. You might also need to see a health care provider who specializes in the urinary tract (urologist). How is this treated? Treatment for overactive bladder depends on the cause of your condition and whether it is mild or severe. Certain treatments can be done in your health care provider's office or clinic. You can also make lifestyle changes at home. Options include: Behavioral Treatments  Biofeedback. A specialist uses sensors to help you become aware of your body's signals.  Keeping a daily log of when you need to urinate and what happens after the urge. This may help you manage your condition.  Bladder training. This helps you learn to control the urge to urinate by following a schedule that directs you to urinate at regular intervals (timed voiding). At first, you might have to wait a few minutes after feeling the urge. In time,   you should be able to schedule bathroom visits an hour or more apart.  Kegel exercises. These are exercises to strengthen the pelvic floor muscles, which support the bladder. Toning these muscles can help you control urination, even if your bladder muscles are overactive. A specialist will teach you how to do these exercises correctly. They require daily practice.  Weight loss. If you are obese or overweight, losing weight might relieve your symptoms of overactive bladder. Talk to your health care provider about losing weight and whether there is a specific program or method that would work best for you.  Diet change. This might help if constipation is making your overactive bladder worse. Your health care provider or a dietitian can explain ways to change what you eat to ease constipation. You might also need to consume less alcohol and caffeine or drink other fluids at different times of the day.  Stopping  smoking.  Wearing pads to absorb leakage while you wait for other treatments to take effect. Physical Treatments  Electrical stimulation. Electrodes send gentle pulses of electricity to strengthen the nerves or muscles that help to control the bladder. Sometimes, the electrodes are placed outside of the body. In other cases, they might be placed inside the body (implanted). This treatment can take several months to have an effect.  Supportive devices. Women may need a plastic device that fits into the vagina and supports the bladder (pessary). Medicines Several medicines can help treat overactive bladder and are usually used along with other treatments. Some are injected into the muscles involved in urination. Others come in pill form. Your health care provider may prescribe:  Antispasmodics. These medicines block the signals that the nerves send to the bladder. This keeps the bladder from releasing urine at the wrong time.  Tricyclic antidepressants. These types of antidepressants also relax bladder muscles.  Surgery  You may have a device implanted to help manage the nerve signals that indicate when you need to urinate.  You may have surgery to implant electrodes for electrical stimulation.  Sometimes, very severe cases of overactive bladder require surgery to change the shape of the bladder. Follow these instructions at home:  Take medicines only as directed by your health care provider.  Use any implants or a pessary as directed by your health care provider.  Make any diet or lifestyle changes that are recommended by your health care provider. These might include: ? Drinking less fluid or drinking at different times of the day. If you need to urinate often during the night, you may need to stop drinking fluids early in the evening. ? Cutting down on caffeine or alcohol. Both can make an overactive bladder worse. Caffeine is found in coffee, tea, and sodas. ? Doing Kegel exercises  to strengthen muscles. ? Losing weight if you need to. ? Eating a healthy and balanced diet to prevent constipation.  Keep a journal or log to track how much and when you drink and also when you feel the need to urinate. This will help your health care provider to monitor your condition. Contact a health care provider if:  Your symptoms do not get better after treatment.  Your pain and discomfort are getting worse.  You have more frequent urges to urinate.  You have a fever. Get help right away if: You are not able to control your bladder at all. This information is not intended to replace advice given to you by your health care provider. Make sure you discuss any questions   you have with your health care provider. Document Released: 09/26/2009 Document Revised: 05/07/2016 Document Reviewed: 04/25/2014 [employer] Interactive Patient Education  2018 [employer]  

## 2018-05-11 ENCOUNTER — Other Ambulatory Visit: Payer: Self-pay | Admitting: Internal Medicine

## 2018-05-24 HISTORY — PX: CATARACT EXTRACTION: SUR2

## 2018-06-06 ENCOUNTER — Other Ambulatory Visit (INDEPENDENT_AMBULATORY_CARE_PROVIDER_SITE_OTHER): Payer: Medicare Other

## 2018-06-06 ENCOUNTER — Ambulatory Visit: Payer: Medicare Other | Admitting: Internal Medicine

## 2018-06-06 ENCOUNTER — Encounter: Payer: Self-pay | Admitting: Internal Medicine

## 2018-06-06 VITALS — BP 110/70 | HR 83 | Temp 97.9°F | Ht 59.0 in | Wt 181.0 lb

## 2018-06-06 DIAGNOSIS — R945 Abnormal results of liver function studies: Secondary | ICD-10-CM

## 2018-06-06 DIAGNOSIS — E118 Type 2 diabetes mellitus with unspecified complications: Secondary | ICD-10-CM | POA: Diagnosis not present

## 2018-06-06 DIAGNOSIS — R5383 Other fatigue: Secondary | ICD-10-CM | POA: Diagnosis not present

## 2018-06-06 DIAGNOSIS — R7989 Other specified abnormal findings of blood chemistry: Secondary | ICD-10-CM

## 2018-06-06 LAB — POCT GLYCOSYLATED HEMOGLOBIN (HGB A1C): Hemoglobin A1C: 8.1 % — AB (ref 4.0–5.6)

## 2018-06-06 LAB — COMPREHENSIVE METABOLIC PANEL
ALT: 85 U/L — AB (ref 0–35)
AST: 92 U/L — ABNORMAL HIGH (ref 0–37)
Albumin: 4.3 g/dL (ref 3.5–5.2)
Alkaline Phosphatase: 89 U/L (ref 39–117)
BILIRUBIN TOTAL: 0.5 mg/dL (ref 0.2–1.2)
BUN: 18 mg/dL (ref 6–23)
CO2: 25 mEq/L (ref 19–32)
Calcium: 10.4 mg/dL (ref 8.4–10.5)
Chloride: 104 mEq/L (ref 96–112)
Creatinine, Ser: 0.62 mg/dL (ref 0.40–1.20)
GFR: 99.76 mL/min (ref >60.00)
GLUCOSE: 150 mg/dL — AB (ref 70–99)
POTASSIUM: 4.1 meq/L (ref 3.5–5.1)
SODIUM: 139 meq/L (ref 135–145)
TOTAL PROTEIN: 7.1 g/dL (ref 6.0–8.3)

## 2018-06-06 LAB — TSH: TSH: 1.12 u[IU]/mL (ref 0.35–4.50)

## 2018-06-06 LAB — CBC
HCT: 41.1 % (ref 36.0–46.0)
HEMOGLOBIN: 14.3 g/dL (ref 12.0–15.0)
MCHC: 34.8 g/dL (ref 30.0–36.0)
MCV: 90.9 fl (ref 78.0–100.0)
Platelets: 239 10*3/uL (ref 150.0–400.0)
RBC: 4.52 Mil/uL (ref 3.87–5.11)
RDW: 12.3 % (ref 11.5–15.5)
WBC: 6.7 10*3/uL (ref 4.0–10.5)

## 2018-06-06 LAB — LIPID PANEL
CHOLESTEROL: 185 mg/dL (ref 0–200)
HDL: 47.4 mg/dL (ref >39.00)
NONHDL: 138.05
Total CHOL/HDL Ratio: 4
Triglycerides: 333 mg/dL — ABNORMAL HIGH (ref 0.0–149.0)
VLDL: 66.6 mg/dL — ABNORMAL HIGH (ref 0.0–40.0)

## 2018-06-06 LAB — LDL CHOLESTEROL, DIRECT: LDL DIRECT: 98 mg/dL

## 2018-06-06 LAB — VITAMIN B12: Vitamin B-12: 580 pg/mL (ref 211–911)

## 2018-06-06 MED ORDER — METFORMIN HCL ER 500 MG PO TB24: 1500.0000 mg | ORAL_TABLET | Freq: Every day | ORAL | 3 refills | Status: DC

## 2018-06-06 NOTE — Progress Notes (Signed)
   Subjective:    Patient ID: Faith Orr, female    DOB: [birth year], 77 y.o.   MRN: [id number]  HPI The patient is a 77 YO female coming in for several concerns including her diabetes (she recently undergone cataract surgery and is on prednisone eye drops, her sugars have been running much higher, running around 200 in the morning and post meal have been as high as 300, having some vision changes and fogginess in her brain, no new numbness or burning in her feet or hands), as well as fatigue (she is not herself in the last 6 months, longer than her sugars have been running high, she denies chest pains or headaches or breathing problems, no stomach changes, no diet changes, some elevation of lfts on last labs without good cause) and elevated lfts (no change in diet or tylenol intake, no excessive alcohol intake, no stomach pain or jaundice symptoms).   Review of Systems  Constitutional: Positive for activity change and fatigue. Negative for appetite change, fever and unexpected weight change.  HENT: Negative.   Eyes: Positive for visual disturbance.  Respiratory: Negative for cough, chest tightness and shortness of breath.   Cardiovascular: Negative for chest pain, palpitations and leg swelling.  Gastrointestinal: Negative for abdominal distention, abdominal pain, constipation, diarrhea, nausea and vomiting.  Musculoskeletal: Negative.   Skin: Negative.   Neurological: Negative.        Brain fogginess.   Psychiatric/Behavioral: Negative.       Objective:   Physical Exam  Constitutional: She is oriented to person, place, and time. She appears well-developed and well-nourished.  HENT:  Head: Normocephalic and atraumatic.  Eyes: EOM are normal.  No jaundice  Neck: Normal range of motion.  Cardiovascular: Normal rate and regular rhythm.  Pulmonary/Chest: Effort normal and breath sounds normal. No respiratory distress. She has no wheezes. She has no rales.  Abdominal: Soft. Bowel sounds are  normal. She exhibits no distension. There is no tenderness. There is no rebound.  Musculoskeletal: She exhibits no edema.  Neurological: She is alert and oriented to person, place, and time. Coordination normal.  Skin: Skin is warm and dry.  Psychiatric: She has a normal mood and affect.   Vitals:   06/06/18 1557  BP: 110/70  Pulse: 83  Temp: 97.9 F (36.6 C)  TempSrc: Oral  SpO2: 96%  Weight: 181 lb (82.1 kg)  Height: 4\' 11"  (1.499 m)      Assessment & Plan:

## 2018-06-06 NOTE — Patient Instructions (Signed)
We will have you increase the metformin to 3 pills daily.

## 2018-06-07 DIAGNOSIS — R945 Abnormal results of liver function studies: Secondary | ICD-10-CM

## 2018-06-07 DIAGNOSIS — R7989 Other specified abnormal findings of blood chemistry: Secondary | ICD-10-CM | POA: Insufficient documentation

## 2018-06-07 DIAGNOSIS — R5383 Other fatigue: Secondary | ICD-10-CM | POA: Insufficient documentation

## 2018-06-07 LAB — VITAMIN D 25 HYDROXY (VIT D DEFICIENCY, FRACTURES): VITD: 56.85 ng/mL (ref 30.00–100.00)

## 2018-06-07 NOTE — Assessment & Plan Note (Signed)
Checking thyroid, B12, CBC, CMP, vitamin D today and adjust as needed. It is possible that her change in sugar levels could be causing her symptoms but we want to rule out alternate possibility.

## 2018-06-07 NOTE — Assessment & Plan Note (Signed)
Recheck CMP, she does not drink excessively or take tylenol outside of normal limits. Her sugars are not as well controlled but this does not explain elevation in lfts. No stomach pain to suggest etiology.

## 2018-06-07 NOTE — Assessment & Plan Note (Signed)
HgA1c poc done today is 8.1 which is slightly above goal and increasing from prior. Given her high sugars and need for another cataract procedure and almost 2 more months of prednisone eye drops will increase metformin to 3 pills daily. If no change will increase to 4 pills daily. It is possible that her symptoms are related to high sugar values. We talked about diet modification as well, she does not eat high sugar foods.

## 2018-06-08 ENCOUNTER — Other Ambulatory Visit: Payer: Self-pay | Admitting: Internal Medicine

## 2018-06-08 DIAGNOSIS — R7989 Other specified abnormal findings of blood chemistry: Secondary | ICD-10-CM

## 2018-06-08 DIAGNOSIS — R945 Abnormal results of liver function studies: Principal | ICD-10-CM

## 2018-06-29 ENCOUNTER — Ambulatory Visit
Admission: RE | Admit: 2018-06-29 | Discharge: 2018-06-29 | Disposition: A | Discharge status: Home / Self Care | Payer: Medicare Other | Source: Ambulatory Visit | Attending: Internal Medicine | Admitting: Internal Medicine

## 2018-06-29 DIAGNOSIS — R7989 Other specified abnormal findings of blood chemistry: Secondary | ICD-10-CM

## 2018-06-29 DIAGNOSIS — R945 Abnormal results of liver function studies: Principal | ICD-10-CM

## 2018-06-30 ENCOUNTER — Other Ambulatory Visit: Payer: Self-pay | Admitting: Internal Medicine

## 2018-06-30 DIAGNOSIS — R7989 Other specified abnormal findings of blood chemistry: Secondary | ICD-10-CM

## 2018-06-30 DIAGNOSIS — R945 Abnormal results of liver function studies: Principal | ICD-10-CM

## 2018-07-21 ENCOUNTER — Other Ambulatory Visit (INDEPENDENT_AMBULATORY_CARE_PROVIDER_SITE_OTHER): Payer: Medicare Other

## 2018-07-21 DIAGNOSIS — R7989 Other specified abnormal findings of blood chemistry: Secondary | ICD-10-CM

## 2018-07-21 DIAGNOSIS — R945 Abnormal results of liver function studies: Secondary | ICD-10-CM

## 2018-07-21 LAB — HEPATIC FUNCTION PANEL
ALT: 59 U/L — AB (ref 0–35)
AST: 60 U/L — AB (ref 0–37)
Albumin: 4 g/dL (ref 3.5–5.2)
Alkaline Phosphatase: 90 U/L (ref 39–117)
BILIRUBIN DIRECT: 0.1 mg/dL (ref 0.0–0.3)
TOTAL PROTEIN: 6.9 g/dL (ref 6.0–8.3)
Total Bilirubin: 0.5 mg/dL (ref 0.2–1.2)

## 2018-08-11 ENCOUNTER — Ambulatory Visit (INDEPENDENT_AMBULATORY_CARE_PROVIDER_SITE_OTHER): Payer: Medicare Other

## 2018-08-11 DIAGNOSIS — Z23 Encounter for immunization: Secondary | ICD-10-CM | POA: Diagnosis not present

## 2018-11-01 ENCOUNTER — Other Ambulatory Visit: Payer: Self-pay | Admitting: Internal Medicine

## 2018-11-08 ENCOUNTER — Other Ambulatory Visit: Payer: Self-pay | Admitting: Internal Medicine

## 2018-11-08 DIAGNOSIS — Z1231 Encounter for screening mammogram for malignant neoplasm of breast: Secondary | ICD-10-CM

## 2018-12-02 ENCOUNTER — Telehealth: Payer: Self-pay | Admitting: *Deleted

## 2018-12-02 NOTE — Telephone Encounter (Signed)
LVM for patient to call back in regards to scheduling AWV with our health coach.  

## 2018-12-05 ENCOUNTER — Ambulatory Visit (INDEPENDENT_AMBULATORY_CARE_PROVIDER_SITE_OTHER)
Admission: RE | Admit: 2018-12-05 | Discharge: 2018-12-05 | Disposition: A | Discharge status: Home / Self Care | Payer: Medicare Other | Source: Ambulatory Visit | Attending: Internal Medicine | Admitting: Internal Medicine

## 2018-12-05 ENCOUNTER — Encounter: Payer: Self-pay | Admitting: Internal Medicine

## 2018-12-05 ENCOUNTER — Ambulatory Visit: Payer: Medicare Other | Admitting: Internal Medicine

## 2018-12-05 VITALS — BP 130/80 | HR 78 | Temp 98.4°F | Ht 59.0 in | Wt 182.0 lb

## 2018-12-05 DIAGNOSIS — E785 Hyperlipidemia, unspecified: Secondary | ICD-10-CM

## 2018-12-05 DIAGNOSIS — F320 Major depressive disorder, single episode, mild: Secondary | ICD-10-CM | POA: Diagnosis not present

## 2018-12-05 DIAGNOSIS — Z0000 Encounter for general adult medical examination without abnormal findings: Secondary | ICD-10-CM | POA: Diagnosis not present

## 2018-12-05 DIAGNOSIS — M5441 Lumbago with sciatica, right side: Secondary | ICD-10-CM | POA: Diagnosis not present

## 2018-12-05 DIAGNOSIS — E118 Type 2 diabetes mellitus with unspecified complications: Secondary | ICD-10-CM

## 2018-12-05 DIAGNOSIS — E1169 Type 2 diabetes mellitus with other specified complication: Secondary | ICD-10-CM | POA: Diagnosis not present

## 2018-12-05 LAB — POCT GLYCOSYLATED HEMOGLOBIN (HGB A1C): Hemoglobin A1C: 7.9 % — AB (ref 4.0–5.6)

## 2018-12-05 MED ORDER — PREDNISONE 20 MG PO TABS: 40.0000 mg | ORAL_TABLET | Freq: Every day | ORAL | 0 refills | Status: DC

## 2018-12-05 NOTE — Patient Instructions (Signed)
We will do the x-ray of the low back and the hip.   We have sent in 5 day course of prednisone to take 2 pills daily for helping the pain.   Health Maintenance, Female Adopting a healthy lifestyle and getting preventive care can go a long way to promote health and wellness. Talk with your health care provider about what schedule of regular examinations is right for you. This is a good chance for you to check in with your provider about disease prevention and staying healthy. In between checkups, there are plenty of things you can do on your own. Experts have done a lot of research about which lifestyle changes and preventive measures are most likely to keep you healthy. Ask your health care provider for more information. Weight and diet Eat a healthy diet  Be sure to include plenty of vegetables, fruits, low-fat dairy products, and lean protein.  Do not eat a lot of foods high in solid fats, added sugars, or salt.  Get regular exercise. This is one of the most important things you can do for your health. ? Most adults should exercise for at least 150 minutes each week. The exercise should increase your heart rate and make you sweat (moderate-intensity exercise). ? Most adults should also do strengthening exercises at least twice a week. This is in addition to the moderate-intensity exercise. Maintain a healthy weight  Body mass index (BMI) is a measurement that can be used to identify possible weight problems. It estimates body fat based on height and weight. Your health care provider can help determine your BMI and help you achieve or maintain a healthy weight.  For females 8 years of age and older: ? A BMI below 18.5 is considered underweight. ? A BMI of 18.5 to 24.9 is normal. ? A BMI of 25 to 29.9 is considered overweight. ? A BMI of 30 and above is considered obese. Watch levels of cholesterol and blood lipids  You should start having your blood tested for lipids and cholesterol at  75 years of age, then have this test every 5 years.  You may need to have your cholesterol levels checked more often if: ? Your lipid or cholesterol levels are high. ? You are older than 75 years of age. ? You are at high risk for heart disease. Cancer screening Lung Cancer  Lung cancer screening is recommended for adults 66-77 years old who are at high risk for lung cancer because of a history of smoking.  A yearly low-dose CT scan of the lungs is recommended for people who: ? Currently smoke. ? Have quit within the past 15 years. ? Have at least a 30-pack-year history of smoking. A pack year is smoking an average of one pack of cigarettes a day for 1 year.  Yearly screening should continue until it has been 15 years since you quit.  Yearly screening should stop if you develop a health problem that would prevent you from having lung cancer treatment. Breast Cancer  Practice breast self-awareness. This means understanding how your breasts normally appear and feel.  It also means doing regular breast self-exams. Let your health care provider know about any changes, no matter how small.  If you are in your 20s or 30s, you should have a clinical breast exam (CBE) by a health care provider every 1-3 years as part of a regular health exam.  If you are 57 or older, have a CBE every year. Also consider having a breast  X-ray (mammogram) every year.  If you have a family history of breast cancer, talk to your health care provider about genetic screening.  If you are at high risk for breast cancer, talk to your health care provider about having an MRI and a mammogram every year.  Breast cancer gene (BRCA) assessment is recommended for women who have family members with BRCA-related cancers. BRCA-related cancers include: ? Breast. ? Ovarian. ? Tubal. ? Peritoneal cancers.  Results of the assessment will determine the need for genetic counseling and BRCA1 and BRCA2 testing. Cervical  Cancer Your health care provider may recommend that you be screened regularly for cancer of the pelvic organs (ovaries, uterus, and vagina). This screening involves a pelvic examination, including checking for microscopic changes to the surface of your cervix (Pap test). You may be encouraged to have this screening done every 3 years, beginning at age 27.  For women ages 70-65, health care providers may recommend pelvic exams and Pap testing every 3 years, or they may recommend the Pap and pelvic exam, combined with testing for human papilloma virus (HPV), every 5 years. Some types of HPV increase your risk of cervical cancer. Testing for HPV may also be done on women of any age with unclear Pap test results.  Other health care providers may not recommend any screening for nonpregnant women who are considered low risk for pelvic cancer and who do not have symptoms. Ask your health care provider if a screening pelvic exam is right for you.  If you have had past treatment for cervical cancer or a condition that could lead to cancer, you need Pap tests and screening for cancer for at least 20 years after your treatment. If Pap tests have been discontinued, your risk factors (such as having a new sexual partner) need to be reassessed to determine if screening should resume. Some women have medical problems that increase the chance of getting cervical cancer. In these cases, your health care provider may recommend more frequent screening and Pap tests. Colorectal Cancer  This type of cancer can be detected and often prevented.  Routine colorectal cancer screening usually begins at 75 years of age and continues through 75 years of age.  Your health care provider may recommend screening at an earlier age if you have risk factors for colon cancer.  Your health care provider may also recommend using home test kits to check for hidden blood in the stool.  A small camera at the end of a tube can be used to  examine your colon directly (sigmoidoscopy or colonoscopy). This is done to check for the earliest forms of colorectal cancer.  Routine screening usually begins at age 85.  Direct examination of the colon should be repeated every 5-10 years through 75 years of age. However, you may need to be screened more often if early forms of precancerous polyps or small growths are found. Skin Cancer  Check your skin from head to toe regularly.  Tell your health care provider about any new moles or changes in moles, especially if there is a change in a mole's shape or color.  Also tell your health care provider if you have a mole that is larger than the size of a pencil eraser.  Always use sunscreen. Apply sunscreen liberally and repeatedly throughout the day.  Protect yourself by wearing long sleeves, pants, a wide-brimmed hat, and sunglasses whenever you are outside. Heart disease, diabetes, and high blood pressure  High blood pressure causes heart disease  and increases the risk of stroke. High blood pressure is more likely to develop in: ? People who have blood pressure in the high end of the normal range (130-139/85-89 mm Hg). ? People who are overweight or obese. ? People who are African American.  If you are 17-66 years of age, have your blood pressure checked every 3-5 years. If you are 20 years of age or older, have your blood pressure checked every year. You should have your blood pressure measured twice-once when you are at a hospital or clinic, and once when you are not at a hospital or clinic. Record the average of the two measurements. To check your blood pressure when you are not at a hospital or clinic, you can use: ? An automated blood pressure machine at a pharmacy. ? A home blood pressure monitor.  If you are between 67 years and 39 years old, ask your health care provider if you should take aspirin to prevent strokes.  Have regular diabetes screenings. This involves taking a blood  sample to check your fasting blood sugar level. ? If you are at a normal weight and have a low risk for diabetes, have this test once every three years after 75 years of age. ? If you are overweight and have a high risk for diabetes, consider being tested at a younger age or more often. Preventing infection Hepatitis B  If you have a higher risk for hepatitis B, you should be screened for this virus. You are considered at high risk for hepatitis B if: ? You were born in a country where hepatitis B is common. Ask your health care provider which countries are considered high risk. ? Your parents were born in a high-risk country, and you have not been immunized against hepatitis B (hepatitis B vaccine). ? You have HIV or AIDS. ? You use needles to inject street drugs. ? You live with someone who has hepatitis B. ? You have had sex with someone who has hepatitis B. ? You get hemodialysis treatment. ? You take certain medicines for conditions, including cancer, organ transplantation, and autoimmune conditions. Hepatitis C  Blood testing is recommended for: ? Everyone born from [birth year] through 1963. ? Anyone with known risk factors for hepatitis C. Sexually transmitted infections (STIs)  You should be screened for sexually transmitted infections (STIs) including gonorrhea and chlamydia if: ? You are sexually active and are younger than 75 years of age. ? You are older than 75 years of age and your health care provider tells you that you are at risk for this type of infection. ? Your sexual activity has changed since you were last screened and you are at an increased risk for chlamydia or gonorrhea. Ask your health care provider if you are at risk.  If you do not have HIV, but are at risk, it may be recommended that you take a prescription medicine daily to prevent HIV infection. This is called pre-exposure prophylaxis (PrEP). You are considered at risk if: ? You are sexually active and do not  regularly use condoms or know the HIV status of your partner(s). ? You take drugs by injection. ? You are sexually active with a partner who has HIV. Talk with your health care provider about whether you are at high risk of being infected with HIV. If you choose to begin PrEP, you should first be tested for HIV. You should then be tested every 3 months for as long as you are taking PrEP. Pregnancy  If you are premenopausal and you may become pregnant, ask your health care provider about preconception counseling.  If you may become pregnant, take 400 to 800 micrograms (mcg) of folic acid every day.  If you want to prevent pregnancy, talk to your health care provider about birth control (contraception). Osteoporosis and menopause  Osteoporosis is a disease in which the bones lose minerals and strength with aging. This can result in serious bone fractures. Your risk for osteoporosis can be identified using a bone density scan.  If you are 64 years of age or older, or if you are at risk for osteoporosis and fractures, ask your health care provider if you should be screened.  Ask your health care provider whether you should take a calcium or vitamin D supplement to lower your risk for osteoporosis.  Menopause may have certain physical symptoms and risks.  Hormone replacement therapy may reduce some of these symptoms and risks. Talk to your health care provider about whether hormone replacement therapy is right for you. Follow these instructions at home:  Schedule regular health, dental, and eye exams.  Stay current with your immunizations.  Do not use any tobacco products including cigarettes, chewing tobacco, or electronic cigarettes.  If you are pregnant, do not drink alcohol.  If you are breastfeeding, limit how much and how often you drink alcohol.  Limit alcohol intake to no more than 1 drink per day for nonpregnant women. One drink equals 12 ounces of beer, 5 ounces of wine, or 1  ounces of hard liquor.  Do not use street drugs.  Do not share needles.  Ask your health care provider for help if you need support or information about quitting drugs.  Tell your health care provider if you often feel depressed.  Tell your health care provider if you have ever been abused or do not feel safe at home. This information is not intended to replace advice given to you by your health care provider. Make sure you discuss any questions you have with your health care provider. Document Released: 06/15/2011 Document Revised: 05/07/2016 Document Reviewed: 09/03/2015 Elsevier Interactive Patient Education  2019 [employer].

## 2018-12-05 NOTE — Progress Notes (Signed)
   Subjective:   Patient ID: Faith Orr, female    DOB: 1941-04-29, 77 y.o.   MRN: [id number]  HPI Here for medicare wellness and physical, with new complaints. Please see A/P for status and treatment of chronic medical problems.   HPI #2: Here for new right leg and hip pain (started about 6 weeks ago, had a fall but denies severe pain after the fall, is able to walk but sometimes not stable, taking ibuprofen a lot to help with pain and to be functional, denies worsening but stable pain since that time, denies numbness in legs, no change in bowel or bladder, pain hurts in the groin and upper leg).   Diet: DM since diabetic Physical activity: sedentary Depression/mood screen: negative Hearing: intact to whispered voice, mild loss bilaterally Visual acuity: grossly normal, performs annual eye exam  ADLs: capable Fall risk: medium Home safety: good Cognitive evaluation: intact to orientation, naming, recall and repetition EOL planning: adv directives discussed  I have personally reviewed and have noted 1. The patient's medical and social history - reviewed today no changes 2. Their use of alcohol, tobacco or illicit drugs 3. Their current medications and supplements 4. The patient's functional ability including ADL's, fall risks, home safety risks and hearing or visual impairment. 5. Diet and physical activities 6. Evidence for depression or mood disorders 7. Care team reviewed and updated (available in snapshot)  Review of Systems  Constitutional: Positive for activity change. Negative for appetite change, chills, fatigue, fever and unexpected weight change.  HENT: Negative.   Eyes: Negative.   Respiratory: Negative.  Negative for cough, chest tightness and shortness of breath.   Cardiovascular: Negative.  Negative for chest pain, palpitations and leg swelling.  Gastrointestinal: Negative.  Negative for abdominal distention, abdominal pain, constipation, diarrhea, nausea and  vomiting.  Musculoskeletal: Positive for arthralgias and back pain. Negative for gait problem and joint swelling.  Skin: Negative.   Neurological: Negative.   Psychiatric/Behavioral: Negative.     Objective:  Physical Exam Constitutional:      Appearance: She is well-developed.  HENT:     Head: Normocephalic and atraumatic.  Neck:     Musculoskeletal: Normal range of motion.  Cardiovascular:     Rate and Rhythm: Normal rate and regular rhythm.  Pulmonary:     Effort: Pulmonary effort is normal. No respiratory distress.     Breath sounds: Normal breath sounds. No wheezing or rales.  Abdominal:     General: Bowel sounds are normal. There is no distension.     Palpations: Abdomen is soft.     Tenderness: There is no abdominal tenderness. There is no rebound.  Musculoskeletal:        General: Tenderness present.     Comments: Pain in the upper thigh and right groin  Skin:    General: Skin is warm and dry.  Neurological:     Mental Status: She is alert and oriented to person, place, and time.     Coordination: Coordination abnormal.     Comments: Slow gait     Vitals:   12/05/18 1259  BP: 130/80  Pulse: 78  Temp: 98.4 F (36.9 C)  TempSrc: Oral  SpO2: 96%  Weight: 182 lb (82.6 kg)  Height: 4\' 11"  (1.499 m)    Assessment & Plan:

## 2018-12-06 ENCOUNTER — Telehealth: Payer: Self-pay

## 2018-12-06 ENCOUNTER — Other Ambulatory Visit: Payer: Self-pay | Admitting: Internal Medicine

## 2018-12-06 DIAGNOSIS — M899 Disorder of bone, unspecified: Secondary | ICD-10-CM

## 2018-12-06 DIAGNOSIS — M5441 Lumbago with sciatica, right side: Secondary | ICD-10-CM | POA: Insufficient documentation

## 2018-12-06 MED ORDER — ALENDRONATE SODIUM 70 MG PO TABS: 70.0000 mg | ORAL_TABLET | ORAL | 3 refills | Status: DC

## 2018-12-06 NOTE — Assessment & Plan Note (Signed)
Prior lumbar arthritis so checking x-ray lumbar as well as x-ray right hip given location of pain.

## 2018-12-06 NOTE — Assessment & Plan Note (Addendum)
POC HgA1c at goal on her current metformin. Adjust as needed. On statin.

## 2018-12-06 NOTE — Assessment & Plan Note (Signed)
Taking simvastatin 20 mg daily and recent lipid panel at goal.

## 2018-12-06 NOTE — Telephone Encounter (Signed)
Copied from [place] [id number]. Topic: General - Other >> Dec 06, 2018  8:09 AM Leward Quan A wrote: Reason for CRM: Patient called stated that she had a missed call but nothing was noted in her chart. Please advise Ph# [phone] >> Dec 06, 2018  9:46 AM Para Skeans A wrote: Did you call patient?

## 2018-12-06 NOTE — Assessment & Plan Note (Signed)
Controlled on zoloft 50 mg daily. Will continue.

## 2018-12-06 NOTE — Assessment & Plan Note (Signed)
Flu shot up to date. Pneumonia complete. Shingrix counseled. Tetanus up to date. Colonoscopy up to date. Mammogram up to date, pap smear aged out and dexa up to date. Counseled about sun safety and mole surveillance. Counseled about the dangers of distracted driving. Given 10 year screening recommendations.

## 2018-12-06 NOTE — Telephone Encounter (Signed)
Dr. Sharlet Salina has already talked to patient.

## 2018-12-06 NOTE — Assessment & Plan Note (Signed)
BMI 36 but complicated by diabetes, hypertension, hyperlipidemia, OA.

## 2018-12-08 ENCOUNTER — Other Ambulatory Visit (INDEPENDENT_AMBULATORY_CARE_PROVIDER_SITE_OTHER): Payer: Medicare Other

## 2018-12-08 DIAGNOSIS — E118 Type 2 diabetes mellitus with unspecified complications: Secondary | ICD-10-CM

## 2018-12-08 DIAGNOSIS — M899 Disorder of bone, unspecified: Secondary | ICD-10-CM | POA: Diagnosis not present

## 2018-12-08 LAB — CBC WITH DIFFERENTIAL/PLATELET
Basophils Absolute: 0.1 10*3/uL (ref 0.0–0.1)
Basophils Relative: 1 % (ref 0.0–3.0)
Eosinophils Absolute: 0.2 10*3/uL (ref 0.0–0.7)
Eosinophils Relative: 2.8 % (ref 0.0–5.0)
HCT: 38 % (ref 36.0–46.0)
Hemoglobin: 13.1 g/dL (ref 12.0–15.0)
LYMPHS PCT: 34.5 % (ref 12.0–46.0)
Lymphs Abs: 2.5 10*3/uL (ref 0.7–4.0)
MCHC: 34.4 g/dL (ref 30.0–36.0)
MCV: 91.5 fl (ref 78.0–100.0)
MONOS PCT: 13 % — AB (ref 3.0–12.0)
Monocytes Absolute: 0.9 10*3/uL (ref 0.1–1.0)
Neutro Abs: 3.5 10*3/uL (ref 1.4–7.7)
Neutrophils Relative %: 48.7 % (ref 43.0–77.0)
Platelets: 245 10*3/uL (ref 150.0–400.0)
RBC: 4.15 Mil/uL (ref 3.87–5.11)
RDW: 12.7 % (ref 11.5–15.5)
WBC: 7.1 10*3/uL (ref 4.0–10.5)

## 2018-12-08 LAB — COMPREHENSIVE METABOLIC PANEL
ALT: 46 U/L — ABNORMAL HIGH (ref 0–35)
AST: 41 U/L — ABNORMAL HIGH (ref 0–37)
Albumin: 3.9 g/dL (ref 3.5–5.2)
Alkaline Phosphatase: 93 U/L (ref 39–117)
BUN: 24 mg/dL — ABNORMAL HIGH (ref 6–23)
CO2: 23 mEq/L (ref 19–32)
Calcium: 10.3 mg/dL (ref 8.4–10.5)
Chloride: 104 mEq/L (ref 96–112)
Creatinine, Ser: 0.63 mg/dL (ref 0.40–1.20)
GFR: 97.8 mL/min (ref >60.00)
Glucose, Bld: 279 mg/dL — ABNORMAL HIGH (ref 70–99)
POTASSIUM: 3.7 meq/L (ref 3.5–5.1)
Sodium: 136 mEq/L (ref 135–145)
Total Bilirubin: 0.4 mg/dL (ref 0.2–1.2)
Total Protein: 6.4 g/dL (ref 6.0–8.3)

## 2018-12-08 LAB — MICROALBUMIN / CREATININE URINE RATIO
Creatinine,U: 174.8 mg/dL
Microalb Creat Ratio: 2.9 mg/g (ref 0.0–30.0)
Microalb, Ur: 5 mg/dL — ABNORMAL HIGH (ref 0.0–1.9)

## 2018-12-09 ENCOUNTER — Encounter: Payer: Self-pay | Admitting: Internal Medicine

## 2018-12-12 ENCOUNTER — Other Ambulatory Visit: Payer: Self-pay | Admitting: Internal Medicine

## 2018-12-12 DIAGNOSIS — M899 Disorder of bone, unspecified: Secondary | ICD-10-CM

## 2018-12-12 LAB — PROTEIN ELECTROPHORESIS, SERUM
Abnormal Protein Band1: 0.5 g/dL — ABNORMAL HIGH
Albumin ELP: 3.6 g/dL — ABNORMAL LOW (ref 3.8–4.8)
Alpha 1: 0.3 g/dL (ref 0.2–0.3)
Alpha 2: 0.7 g/dL (ref 0.5–0.9)
BETA GLOBULIN: 0.4 g/dL (ref 0.4–0.6)
Beta 2: 0.3 g/dL (ref 0.2–0.5)
Gamma Globulin: 0.7 g/dL — ABNORMAL LOW (ref 0.8–1.7)
TOTAL PROTEIN: 6 g/dL — AB (ref 6.1–8.1)

## 2018-12-12 MED ORDER — VITAMIN D (ERGOCALCIFEROL) 1.25 MG (50000 UNIT) PO CAPS: 50000.0000 [IU] | ORAL_CAPSULE | ORAL | 0 refills | Status: DC

## 2018-12-13 ENCOUNTER — Encounter (HOSPITAL_COMMUNITY): Payer: Self-pay

## 2018-12-13 ENCOUNTER — Ambulatory Visit (HOSPITAL_COMMUNITY)
Admission: RE | Admit: 2018-12-13 | Discharge: 2018-12-13 | Disposition: A | Discharge status: Home / Self Care | Payer: Medicare Other | Source: Ambulatory Visit | Attending: Internal Medicine | Admitting: Internal Medicine

## 2018-12-13 ENCOUNTER — Encounter (HOSPITAL_COMMUNITY)
Admission: RE | Admit: 2018-12-13 | Discharge: 2018-12-13 | Disposition: A | Discharge status: Home / Self Care | Payer: Medicare Other | Source: Ambulatory Visit | Attending: Internal Medicine | Admitting: Internal Medicine

## 2018-12-13 DIAGNOSIS — M899 Disorder of bone, unspecified: Secondary | ICD-10-CM

## 2018-12-13 MED ORDER — TECHNETIUM TC 99M MEDRONATE IV KIT
20.0000 | PACK | Freq: Once | INTRAVENOUS | Status: AC | PRN
  Administered 2018-12-13: 20.3 via INTRAVENOUS

## 2018-12-15 ENCOUNTER — Encounter: Payer: Self-pay | Admitting: Hematology

## 2018-12-15 ENCOUNTER — Telehealth: Payer: Self-pay | Admitting: Hematology

## 2018-12-15 ENCOUNTER — Encounter: Payer: Self-pay | Admitting: Internal Medicine

## 2018-12-15 ENCOUNTER — Other Ambulatory Visit: Payer: Self-pay | Admitting: Internal Medicine

## 2018-12-15 MED ORDER — TRAMADOL HCL 50 MG PO TABS: 50.0000 mg | ORAL_TABLET | Freq: Three times a day (TID) | ORAL | 0 refills | Status: DC | PRN

## 2018-12-15 NOTE — Telephone Encounter (Signed)
New patient appt has been scheduled for the pt to see Dr. Irene Limbo on 1/14 at 1pm. PT aware to arrive 30 minutes early. Letter mailed.

## 2018-12-26 ENCOUNTER — Ambulatory Visit: Payer: Medicare Other

## 2018-12-26 NOTE — Progress Notes (Signed)
HEMATOLOGY/ONCOLOGY CONSULTATION NOTE  Date of Service: 12/27/2018  [facility] Care Team: Hoyt Koch, MD as PCP - General (Internal Medicine) Lafayette Dragon, MD (Inactive) as Consulting Physician (Gastroenterology) Megan Salon, MD as Consulting Physician (Gynecology) Melrose Nakayama, MD as Consulting Physician (Orthopedic Surgery) Deneise Lever, MD as Consulting Physician (Pulmonary Disease) [name], MD (Ophthalmology)  CHIEF COMPLAINTS/PURPOSE OF CONSULTATION:  Lytic bone lesions  HISTORY OF PRESENTING ILLNESS:   Faith Orr is a wonderful 78 y.o. female who has been referred to [name] by Dr. Pricilla Holm for evaluation and management of Lytic bone lesions. She is accompanied today by her son in law, and her partner is present via phone. The pt reports that she is doing well overall.   The pt notes that 2-3 months ago while standing at the stove, and otherwise feeling normally, she felt "something snap that took her breath away" in her mid back as she stretched to get something. The pt notes that she saw a chiropractor twice due to her back stiffness, which did not help. The pt then described her new bone pains to her PCP on 12/05/18, and subsequent imaging, as noted below, revealed concerns for numerous bone lesions. She has begun 54m Fosamax. The pt notes that she had hip pain in 2018, and that an XR at that time did not reveal any lesions.  The pt notes that most of her pain is concentrated to her left shoulder presently, and with minimal movement of the arm. She endorses pain radiating into her left arm and notes that her hand is swollen in the mornings when she wakes up. She also endorses present back pain and right hip pain, worse when she walks. She has not yet seen orthopedics. The pt reports that she is needing to take 604mAdvil every 4-6 hours as Tramadol alone has not been able to alleviate her pain. The pt denies any unexpected weight loss, fevers,  chills, or night sweats. The pt notes that her urine is a very dark color presently, but denies overt blood in the urine, underpants, nor tissue paper. The pt notes that in the last two weeks she has had some soreness in her head, but denies new headaches or changes in vision. The pt notes that she has been urinating more frequently overall, but has been trying to stay better hydrated as well.  The pt notes that she has been compliant with annual mammograms.   The pt notes that she had a cyst in her right breast which was removed in the past. She fractured her left wrist in 2008 after falling down stairs. The pt endorses history of fatty liver.   The pt denies ever smoking cigarettes and endorses significant second hand smoke exposure with a previous marriage.   Of note prior to the Faith's visit today, pt has had a Bone Scan completed on 12/13/18 with results revealing Multifocal uptake throughout the skeleton, consistent with diffuse metastatic disease. Primary tumor is not specified. 2. Uptake in the proximal right femur, consistent with lytic lesions. 3. Uptake in the ribs bilaterally as described. 4. Lesions in the proximal left humerus. 5. Diffuse uptake throughout the skull consistent with metastatic disease. 6. Right paramedian uptake at the manubrium.  Most recent lab results (12/08/18) of CBC w/diff and CMP is as follows: all values are WNL except for Glucose at 279, BUN at 24, AST at 41, ALT at 46. 12/08/18 SPEP revealed all values WNL except for Total Protein  at 6.0, Albumin at 3.6, Gamma globulin at 0.7, and M spike at 0.5g  On review of systems, pt reports significant left shoulder pain, back pain, right hip pain, dark urine, and denies fevers, chills, night sweats, unexpected weight loss, changes in bowel habits, changes in breathing, cough, new respiratory symptoms, changes in vision, abdominal pains, leg swelling, and any other symptoms.   On PMHx the pt reports fatty liver, and  denies blood clots.  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 Denies ever smoking.  On Family Hx the pt reports maternal grandmother with colon cancer. Father with bladder cancer and amyloidosis (pt notes that this could have been misdiagnosis). Mother with Protein S deficiency and polymyalgia rheumatica.   MEDICAL HISTORY:  Past Medical History:  Diagnosis Date  . Allergy    seasonal  . Asthma   . DEPRESSION   . DIABETES MELLITUS, TYPE II   . Diverticulosis   . HYPERLIPIDEMIA   . Macular degeneration of left eye    mild, Dr.Hecker  . Obesity, unspecified   . Osteoarthritis of both knees   . OSTEOPENIA   . Osteopenia   . URINARY INCONTINENCE     SURGICAL HISTORY: Past Surgical History:  Procedure Laterality Date  . BREAST SURGERY    . CATARACT EXTRACTION Left 05/24/2018  . CESAREAN SECTION  01/1973  . FRACTURE SURGERY    . left wrist surgery  2008   By Dr. Latanya Maudlin  . right ankle  1994    SOCIAL HISTORY: Social History   Socioeconomic History  . Marital status: Married    Spouse name: Not on file  . Number of children: 1  . Years of education: Not on file  . Highest education level: Not on file  Occupational History    Employer: [employer]  Social Needs  . Financial resource strain: Faith refused  . Food insecurity:    Worry: Faith refused    Inability: Faith refused  . Transportation needs:    Medical: Faith refused    Non-medical: Faith refused  Tobacco Use  . Smoking status: Never Smoker  . Smokeless tobacco: Never Used  . Tobacco comment: Lives with partner [name]) and son  Substance and Sexual Activity  . Alcohol use: No    Alcohol/week: 0.0 standard drinks  . Drug use: No  . Sexual activity: Never    Partners: Female    Birth control/protection: Post-menopausal    Comment: Lives with female partner (annette hicks) and 62 yo son  Lifestyle  . Physical activity:     Days per week: Faith refused    Minutes per session: Faith refused  . Stress: Faith refused  Relationships  . Social connections:    Talks on phone: Faith refused    Gets together: Faith refused    Attends religious service: Faith refused    Active member of club or organization: Faith refused    Attends meetings of clubs or organizations: Faith refused    Relationship status: Faith refused  . Intimate partner violence:    Fear of current or ex partner: Faith refused    Emotionally abused: Faith refused    Physically abused: Faith refused    Forced sexual activity: Faith refused  Other Topics Concern  . Not on file  Social History Narrative  . Not on file    FAMILY HISTORY: Family History  Problem Relation Age of Onset  . Diabetes Father   .  Hyperlipidemia Father   . Heart disease Father   . Cancer Father   . Hypertension Father   . Colon cancer Paternal Grandmother 67  . Osteoporosis Mother   . Protein S deficiency Mother   . Hyperlipidemia Mother   . Multiple sclerosis Daughter   . Cancer Other        bladder  . Breast cancer Neg Hx     ALLERGIES:  is allergic to penicillins; aleve [naproxen sodium]; and sulfonamide derivatives.  MEDICATIONS:  Current Outpatient Medications  Medication Sig Dispense Refill  . albuterol (PROVENTIL HFA;VENTOLIN HFA) 108 (90 Base) MCG/ACT inhaler INHALE 1 TO 2 PUFFS INTO THE LUNGS EVERY 6 HOURS AS NEEDED FOR WHEEZING OR SHORTNESS OF BREATH 54 g 1  . alendronate (FOSAMAX) 70 MG tablet Take 1 tablet (70 mg total) by mouth every 7 (seven) days. Take with a full glass of water on an empty stomach. 12 tablet 3  . Blood Glucose Monitoring Suppl (FREESTYLE FREEDOM LITE) W/DEVICE KIT Use to check blood sugars twice a day Dx 250.00 1 each 0  . Calcium Carbonate-Vitamin D (CALCIUM 600+D HIGH POTENCY) 600-400 MG-UNIT per tablet Take 1 tablet by mouth 2 (two) times daily.     Marland Kitchen doxycycline (PERIOSTAT) 20 MG tablet Take 1  tablet by mouth as needed.  2  . fluticasone (FLONASE) 50 MCG/ACT nasal spray Place 1 spray into both nostrils daily. 16 g 2  . glucose blood (FREESTYLE LITE) test strip CHECK BLOOD SUGAR TWICE DAILY AS DIRECTED Dx 250.00 180 each 3  . ibuprofen (ADVIL) 200 MG tablet Take 600 mg by mouth 4 (four) times daily.     . Lancets (FREESTYLE) lancets Use twice daily to check sugars. 100 each 11  . metFORMIN (GLUCOPHAGE-XR) 500 MG 24 hr tablet Take 3 tablets (1,500 mg total) by mouth daily with breakfast. 270 tablet 3  . montelukast (SINGULAIR) 10 MG tablet TAKE 1 TABLET BY MOUTH DAILY AS NEEDED 30 tablet 3  . Multiple Vitamins-Minerals (ICAPS) CAPS Take 1 capsule by mouth daily.      . pantoprazole (PROTONIX) 20 MG tablet TAKE 1 TABLET(20 MG) BY MOUTH DAILY 30 tablet 5  . sertraline (ZOLOFT) 50 MG tablet TAKE 1 TABLET BY MOUTH DAILY 90 tablet 1  . simvastatin (ZOCOR) 20 MG tablet TAKE 1 TABLET(20 MG) BY MOUTH DAILY 90 tablet 1  . traMADol (ULTRAM) 50 MG tablet Take 1 tablet (50 mg total) by mouth every 8 (eight) hours as needed. 30 tablet 0  . Vitamin D, Ergocalciferol, (DRISDOL) 1.25 MG (50000 UT) CAPS capsule Take 1 capsule (50,000 Units total) by mouth every 7 (seven) days. 12 capsule 0  . ketorolac (ACULAR) 0.4 % SOLN 1 drop 4 (four) times daily.    Marland Kitchen oxyCODONE (OXY IR/ROXICODONE) 5 MG immediate release tablet Take 1-2 tablets (5-10 mg total) by mouth every 6 (six) hours as needed for moderate pain or severe pain. 60 tablet 0  . predniSONE (DELTASONE) 20 MG tablet Take 2 tablets (40 mg total) by mouth daily with breakfast. (Faith not taking: Reported on 12/27/2018) 10 tablet 0  . senna-docusate (SENNA S) 8.6-50 MG tablet Take 2 tablets by mouth at bedtime. 60 tablet 1   No current facility-administered medications for this visit.     REVIEW OF SYSTEMS:    10 Point review of Systems was done is negative except as noted above.  PHYSICAL EXAMINATION: ECOG PERFORMANCE STATUS: 2 - Symptomatic,  <50% confined to bed  . Vitals:   12/27/18  1327  BP: (!) 142/74  Pulse: 81  Resp: 18  Temp: 98.3 F (36.8 C)  SpO2: 98%   Filed Weights   12/27/18 1327  Weight: 181 lb (82.1 kg)   .Body mass index is 36.56 kg/m.  GENERAL:alert, in no acute distress and comfortable SKIN: no acute rashes, no significant lesions EYES: conjunctiva are pink and non-injected, sclera anicteric OROPHARYNX: MMM, no exudates, no oropharyngeal erythema or ulceration NECK: supple, no JVD LYMPH:  no palpable lymphadenopathy in the cervical, axillary or inguinal regions LUNGS: clear to auscultation b/l with normal respiratory effort HEART: regular rate & rhythm ABDOMEN:  normoactive bowel sounds , non tender, not distended. Extremity: no pedal edema PSYCH: alert & oriented x 3 with fluent speech NEURO: no focal motor/sensory deficits  LABORATORY DATA:  I have reviewed the data as listed  . CBC Latest Ref Rng & Units 12/27/2018 12/08/2018 06/06/2018  WBC 4.0 - 10.5 K/uL 7.5 7.1 6.7  Hemoglobin 12.0 - 15.0 g/dL 13.1 13.1 14.3  Hematocrit 36.0 - 46.0 % 38.2 38.0 41.1  Platelets 150 - 400 K/uL 245 245.0 239.0   . CBC    Component Value Date/Time   WBC 7.5 12/27/2018 1448   RBC 4.22 12/27/2018 1448   HGB 13.1 12/27/2018 1448   HCT 38.2 12/27/2018 1448   PLT 245 12/27/2018 1448   MCV 90.5 12/27/2018 1448   MCH 31.0 12/27/2018 1448   MCHC 34.3 12/27/2018 1448   RDW 12.0 12/27/2018 1448   LYMPHSABS 1.7 12/27/2018 1448   MONOABS 1.0 12/27/2018 1448   EOSABS 0.2 12/27/2018 1448   BASOSABS 0.0 12/27/2018 1448    . CMP Latest Ref Rng & Units 12/27/2018 12/08/2018 12/08/2018  Glucose 70 - 99 mg/dL 135(H) - 279(H)  BUN 8 - 23 mg/dL 23 - 24(H)  Creatinine 0.44 - 1.00 mg/dL 0.64 - 0.63  Sodium 135 - 145 mmol/L 137 - 136  Potassium 3.5 - 5.1 mmol/L 4.1 - 3.7  Chloride 98 - 111 mmol/L 106 - 104  CO2 22 - 32 mmol/L 22 - 23  Calcium 8.9 - 10.3 mg/dL 10.7(H) - 10.3  Total Protein 6.5 - 8.1 g/dL 6.9  6.0(L) 6.4  Total Bilirubin 0.3 - 1.2 mg/dL 0.5 - 0.4  Alkaline Phos 38 - 126 U/L 126 - 93  AST 15 - 41 U/L 63(H) - 41(H)  ALT 0 - 44 U/L 73(H) - 46(H)   Component     Latest Ref Rng & Units 12/27/2018  IgG (Immunoglobin G), Serum     700 - 1,600 mg/dL 801  IgA     64 - 422 mg/dL 91  IgM (Immunoglobulin M), Srm     26 - 217 mg/dL 15 (L)  Total Protein ELP     6.0 - 8.5 g/dL 6.6  Albumin SerPl Elph-Mcnc     2.9 - 4.4 g/dL 3.7  Alpha 1     0.0 - 0.4 g/dL 0.2  Alpha2 Glob SerPl Elph-Mcnc     0.4 - 1.0 g/dL 0.8  B-Globulin SerPl Elph-Mcnc     0.7 - 1.3 g/dL 1.2  Gamma Glob SerPl Elph-Mcnc     0.4 - 1.8 g/dL 0.7  M Protein SerPl Elph-Mcnc     Not Observed g/dL 0.5 (H)  Globulin, Total     2.2 - 3.9 g/dL 2.9  Albumin/Glob SerPl     0.7 - 1.7 1.3  IFE 1      Comment  Please Note (HCV):      Comment  Kappa free light chain     3.3 - 19.4 mg/L 5.1  Lamda free light chains     5.7 - 26.3 mg/L 40.3 (H)  Kappa, lamda light chain ratio     0.26 - 1.65 0.13 (L)  Beta-2 Microglobulin     0.6 - 2.4 mg/L 1.7  LDH     98 - 192 U/L 162  Sed Rate     0 - 22 mm/hr 8    RADIOGRAPHIC STUDIES: I have personally reviewed the radiological images as listed and agreed with the findings in the report. Dg Lumbar Spine Complete  Result Date: 12/05/2018 CLINICAL DATA:  78 year old female with chronic low back pain worsening over the past 8 weeks. [place] on cement late October. Pain has increased since. Initial encounter. EXAM: LUMBAR SPINE - COMPLETE 4+ VIEW COMPARISON:  03/09/2017 lumbar spine plain film exam. FINDINGS: Facet degenerative changes lower lumbar spine most notable L4-5 level contributing to 3 mm anterior slip L4. Mild L4-5 disc space narrowing. Mild to moderate L5-S1 disc space narrowing. No compression fracture noted. Vascular calcification. IMPRESSION: 1. No compression fracture noted. 2. Mild to moderate L5-S1 disc space narrowing. 3. Facet degenerative changes lower lumbar  spine most notable L4-5 level contributing to 3 mm anterior slip L4. Mild L4-5 disc space narrowing. 4.  Aortic Atherosclerosis (ICD10-I70.0). Electronically Signed   By: Genia Del M.D.   On: 12/05/2018 18:59   Nm Bone Scan Whole Body  Result Date: 12/13/2018 CLINICAL DATA:  Lytic bone lesions of the right femur. Severe right hip and femur pain. EXAM: NUCLEAR MEDICINE WHOLE BODY BONE SCAN TECHNIQUE: Whole body anterior and posterior images were obtained approximately 3 hours after intravenous injection of radiopharmaceutical. RADIOPHARMACEUTICALS:  20.3 mCi Technetium-66mMDP IV COMPARISON:  CT of the right femur 12/13/2018. Right femur radiographs 12/05/2018. FINDINGS: Uptake in the proximal right femur corresponds to the lytic lesion. Additional uptake is noted anteriorly in the right second and third rib, more laterally in the fourth and fifth ribs, anteriorly the seventh rib. More lateral uptake is present in the left eighth rib. There is uptake in the proximal left humerus. Multiple foci are present in posterior ribs bilaterally. Extensive uptake is present within the calvarium. Uptake is present in the left side of L4. IMPRESSION: 1. Multifocal uptake throughout the skeleton, consistent with diffuse metastatic disease. Primary tumor is not specified. 2. Uptake in the proximal right femur, consistent with lytic lesions. 3. Uptake in the ribs bilaterally as described. 4. Lesions in the proximal left humerus. 5. Diffuse uptake throughout the skull consistent with metastatic disease. 6. Right paramedian uptake at the manubrium. Electronically Signed   By: CSan MorelleM.D.   On: 12/13/2018 15:29   Ct Femur Right Wo Contrast  Result Date: 12/13/2018 CLINICAL DATA:  Right femoral lytic bone lesion seen on recent x-ray dated 12/05/2018. Pain all over for 8 weeks. Status post fall October 2018. EXAM: CT OF THE LOWER RIGHT EXTREMITY WITHOUT CONTRAST TECHNIQUE: Multidetector CT imaging of the right  lower extremity was performed according to the standard protocol. COMPARISON:  None. FINDINGS: Bones/Joint/Cartilage No fracture or dislocation. Normal alignment. No joint effusion. Lytic bone lesion in the proximal femoral diaphysis posteriorly measuring 16 x 12 mm with a soft tissue component extending through the cortex. Second lytic cortically based bone lesion in the mid femoral diaphysis. Multiple other subtle lytic bone lesions within the cortex of the mid and distal femoral diaphysis. Lytic bone lesion in the left inferior pubic ramus.  Ligaments Ligaments are suboptimally evaluated by CT. Muscles and Tendons Muscles are normal.  No muscle atrophy. Soft tissue No fluid collection or hematoma.  No soft tissue mass. IMPRESSION: 1. Numerous lytic lesions involving the right femur and a lytic lesion in the left inferior pubic ramus. Overall appearance is most concerning for multiple myeloma. Electronically Signed   By: Kathreen Devoid   On: 12/13/2018 15:01   Dg Hip Unilat With Pelvis 2-3 Views Right  Result Date: 12/05/2018 CLINICAL DATA:  78 year old female with chronic low back pain worsening over the past 8 weeks. [facility] October 29. Pain is increased since. EXAM: DG HIP (WITH OR WITHOUT PELVIS) 2-3V RIGHT COMPARISON:  None. FINDINGS: Several lytic lesions throughout the right femoral shaft. Largest lytic lesion proximal femoral shaft spans over 2.5 cm with cortical interruption. Overlying stool limits evaluation for lytic lesion of the pelvis. No obvious pelvic lytic lesion noted. IMPRESSION: Several lytic lesions throughout the right femoral shaft. Largest lytic lesion proximal femoral shaft spans over 2.5 cm with cortical interruption. This places Faith at risk for development of pathologic fracture. These results will be called to the ordering clinician or representative by the Radiologist Assistant, and communication documented in the PACS or zVision Dashboard. Electronically Signed   By: Genia Del  M.D.   On: 12/05/2018 19:04    ASSESSMENT & PLAN:  78 y.o. female with  1. Lytic bone lesions Concerning for multiple myeloma vs metastatic malignancy with occult primary  PLAN -Discussed Faith's most recent labs from 12/08/18, blood counts are normal including WBC at 7.1k, HGB at 13.1, and PLT at 245k. Calcium normal at 10.3. Creatinine normal at 0.63. M spike at 0.5g. -Discussed the 12/13/18 Bone Scan which revealed Multifocal uptake throughout the skeleton, consistent with diffuse metastatic disease. Primary tumor is not specified. 2. Uptake in the proximal right femur, consistent with lytic lesions. 3. Uptake in the ribs bilaterally as described. 4. Lesions in the proximal left humerus. 5. Diffuse uptake throughout the skull consistent with metastatic disease. 6. Right paramedian uptake at the manubrium. -Discussed the 12/13/18 CT Right Femur which revealed Numerous lytic lesions involving the right femur and a lytic lesion in the left inferior pubic ramus. Overall appearance is most concerning for multiple myeloma.  -Discussed the CRAB criteria. Pt does have bone lesions and calcium levels today are starting to get elevated at 10.7 -Discussed that the present concerns are for myeloma vs other primary tumor with extensive bone metastases  -Hold Fosamax  -Will refer the pt to orthopedics for evaluation of her right femur involvement, with concern for pathologic fracture prevention -Advised pt to avoid stairs, pulling, pushing, or lifting at this time  -Will order 10m Oxycodone q6h prn, Senna S, and Zofran for opiate-related nausea  -Will order PET/CT to evaluate for initial evaluation of myeloma vs initial evaluate for metastatic malignancy with unknown primary to determine possible primary site and plan appropriate biopsy for tissue diagnosis. -Will order blood tests today and 24 hour UPEP -Will refer the pt to IR for BM Bx -Will see the pt back in 2 weeks   -Labs today including 24h  urine collection. -PET/CT in 5-7 days -CT bone marrow biopsy in 2-3 days -orthopedics referral for evaluation and management of multiple mets in rt femur and left shoulder. ? Need for prophylactic fixation. -RTC with Dr KIrene Limboin 2 weeks   All of the patients questions were answered with apparent satisfaction. The Faith knows to call the clinic with any problems,  questions or concerns.  The total time spent in the appt was 60 minutes and more than 50% was on counseling and direct Faith cares.    Sullivan Lone MD MS AAHIVMS [facility] Hematology/Oncology Physician [facility]  (Office):       [phone] (Work cell):  [phone] (Fax):           [phone]  12/27/2018 2:26 PM  I, Baldwin Jamaica, am acting as a scribe for Dr. Sullivan Lone.   .I have reviewed the above documentation for accuracy and completeness, and I agree with the above. Brunetta Genera MD

## 2018-12-27 ENCOUNTER — Inpatient Hospital Stay: Payer: Medicare Other | Attending: Hematology | Admitting: Hematology

## 2018-12-27 ENCOUNTER — Telehealth: Payer: Self-pay

## 2018-12-27 ENCOUNTER — Inpatient Hospital Stay: Payer: Medicare Other

## 2018-12-27 VITALS — BP 142/74 | HR 81 | Temp 98.3°F | Resp 18 | Ht 59.0 in | Wt 181.0 lb

## 2018-12-27 DIAGNOSIS — C7951 Secondary malignant neoplasm of bone: Secondary | ICD-10-CM | POA: Insufficient documentation

## 2018-12-27 DIAGNOSIS — C801 Malignant (primary) neoplasm, unspecified: Secondary | ICD-10-CM | POA: Diagnosis not present

## 2018-12-27 DIAGNOSIS — M549 Dorsalgia, unspecified: Secondary | ICD-10-CM | POA: Insufficient documentation

## 2018-12-27 DIAGNOSIS — D472 Monoclonal gammopathy: Secondary | ICD-10-CM

## 2018-12-27 DIAGNOSIS — M25512 Pain in left shoulder: Secondary | ICD-10-CM | POA: Diagnosis not present

## 2018-12-27 DIAGNOSIS — M6281 Muscle weakness (generalized): Secondary | ICD-10-CM | POA: Diagnosis not present

## 2018-12-27 DIAGNOSIS — C9000 Multiple myeloma not having achieved remission: Secondary | ICD-10-CM

## 2018-12-27 DIAGNOSIS — E119 Type 2 diabetes mellitus without complications: Secondary | ICD-10-CM | POA: Diagnosis not present

## 2018-12-27 LAB — CBC WITH DIFFERENTIAL/PLATELET
Abs Immature Granulocytes: 0.02 10*3/uL (ref 0.00–0.07)
BASOS ABS: 0 10*3/uL (ref 0.0–0.1)
BASOS PCT: 0 %
Eosinophils Absolute: 0.2 10*3/uL (ref 0.0–0.5)
Eosinophils Relative: 3 %
HCT: 38.2 % (ref 36.0–46.0)
Hemoglobin: 13.1 g/dL (ref 12.0–15.0)
Immature Granulocytes: 0 %
Lymphocytes Relative: 22 %
Lymphs Abs: 1.7 10*3/uL (ref 0.7–4.0)
MCH: 31 pg (ref 26.0–34.0)
MCHC: 34.3 g/dL (ref 30.0–36.0)
MCV: 90.5 fL (ref 80.0–100.0)
Monocytes Absolute: 1 10*3/uL (ref 0.1–1.0)
Monocytes Relative: 14 %
NRBC: 0 % (ref 0.0–0.2)
Neutro Abs: 4.6 10*3/uL (ref 1.7–7.7)
Neutrophils Relative %: 61 %
PLATELETS: 245 10*3/uL (ref 150–400)
RBC: 4.22 MIL/uL (ref 3.87–5.11)
RDW: 12 % (ref 11.5–15.5)
WBC: 7.5 10*3/uL (ref 4.0–10.5)

## 2018-12-27 LAB — LACTATE DEHYDROGENASE: LDH: 162 U/L (ref 98–192)

## 2018-12-27 LAB — CMP (CANCER CENTER ONLY)
ALT: 73 U/L — ABNORMAL HIGH (ref 0–44)
AST: 63 U/L — ABNORMAL HIGH (ref 15–41)
Albumin: 3.8 g/dL (ref 3.5–5.0)
Alkaline Phosphatase: 126 U/L (ref 38–126)
Anion gap: 9 (ref 5–15)
BILIRUBIN TOTAL: 0.5 mg/dL (ref 0.3–1.2)
BUN: 23 mg/dL (ref 8–23)
CO2: 22 mmol/L (ref 22–32)
Calcium: 10.7 mg/dL — ABNORMAL HIGH (ref 8.9–10.3)
Chloride: 106 mmol/L (ref 98–111)
Creatinine: 0.64 mg/dL (ref 0.44–1.00)
GFR, Est AFR Am: >60 mL/min (ref >60)
GFR, Estimated: >60 mL/min (ref >60)
Glucose, Bld: 135 mg/dL — ABNORMAL HIGH (ref 70–99)
Potassium: 4.1 mmol/L (ref 3.5–5.1)
Sodium: 137 mmol/L (ref 135–145)
TOTAL PROTEIN: 6.9 g/dL (ref 6.5–8.1)

## 2018-12-27 LAB — SEDIMENTATION RATE: Sed Rate: 8 mm/hr (ref 0–22)

## 2018-12-27 MED ORDER — SENNOSIDES-DOCUSATE SODIUM 8.6-50 MG PO TABS: 2.0000 | ORAL_TABLET | Freq: Every day | ORAL | 1 refills | Status: DC

## 2018-12-27 MED ORDER — OXYCODONE HCL 5 MG PO TABS: 5.0000 mg | ORAL_TABLET | Freq: Four times a day (QID) | ORAL | 0 refills | Status: DC | PRN

## 2018-12-27 NOTE — Telephone Encounter (Signed)
Printed avs and calender of upcoming appointment. Per 1/14 los 

## 2018-12-27 NOTE — Patient Instructions (Signed)
Thank you for choosing [facility] to provide your oncology and hematology care.  To afford each patient quality time with our providers, please arrive 30 minutes before your scheduled appointment time.  If you arrive late for your appointment, you may be asked to reschedule.  We strive to give you quality time with our providers, and arriving late affects you and other patients whose appointments are after yours.    If you are a no show for multiple scheduled visits, you may be dismissed from the clinic at the providers discretion.     Again, thank you for choosing [facility], our hope is that these requests will decrease the amount of time that you wait before being seen by our physicians.  ______________________________________________________________________   Should you have questions after your visit to the [facility], please contact our office at [phone] between the hours of 8:30 and 4:30 p.m.    Voicemails left after 4:30p.m will not be returned until the following business day.     For prescription refill requests, please have your pharmacy contact us directly.  Please also try to allow 48 hours for prescription requests.     Please contact the scheduling department for questions regarding scheduling.  For scheduling of procedures such as PET scans, CT scans, MRI, Ultrasound, etc please contact central scheduling at [phone].     Resources For Cancer Patients and Caregivers:    Oncolink.org:  A wonderful resource for patients and healthcare providers for information regarding your disease, ways to tract your treatment, what to expect, etc.      American Cancer Society:  800-227-2345  Can help patients locate various types of support and financial assistance   Cancer Care: 1-800-813-HOPE (4673) Provides financial assistance, online support groups, medication/co-pay assistance.     [place] DSS:  336-641-3447 Where to apply  for food stamps, Medicaid, and utility assistance   Medicare Rights Center: 800-333-4114 Helps people with Medicare understand their rights and benefits, navigate the Medicare system, and secure the quality healthcare they deserve   SCAT: 336-333-6589 [place] [employer]'s shared-ride transportation service for eligible riders who have a disability that prevents them from riding the fixed route bus.     For additional information on assistance programs please contact our social worker:   Abigail Elmore:  [phone]  

## 2018-12-28 LAB — KAPPA/LAMBDA LIGHT CHAINS
Kappa free light chain: 5.1 mg/L (ref 3.3–19.4)
Kappa, lambda light chain ratio: 0.13 — ABNORMAL LOW (ref 0.26–1.65)
Lambda free light chains: 40.3 mg/L — ABNORMAL HIGH (ref 5.7–26.3)

## 2018-12-28 LAB — BETA 2 MICROGLOBULIN, SERUM: Beta-2 Microglobulin: 1.7 mg/L (ref 0.6–2.4)

## 2018-12-29 ENCOUNTER — Other Ambulatory Visit: Payer: Self-pay

## 2018-12-29 DIAGNOSIS — C7951 Secondary malignant neoplasm of bone: Secondary | ICD-10-CM

## 2018-12-29 DIAGNOSIS — D472 Monoclonal gammopathy: Secondary | ICD-10-CM

## 2018-12-29 DIAGNOSIS — C9000 Multiple myeloma not having achieved remission: Secondary | ICD-10-CM

## 2018-12-29 LAB — MULTIPLE MYELOMA PANEL, SERUM
Albumin SerPl Elph-Mcnc: 3.7 g/dL (ref 2.9–4.4)
Albumin/Glob SerPl: 1.3 (ref 0.7–1.7)
Alpha 1: 0.2 g/dL (ref 0.0–0.4)
Alpha2 Glob SerPl Elph-Mcnc: 0.8 g/dL (ref 0.4–1.0)
B-Globulin SerPl Elph-Mcnc: 1.2 g/dL (ref 0.7–1.3)
Gamma Glob SerPl Elph-Mcnc: 0.7 g/dL (ref 0.4–1.8)
Globulin, Total: 2.9 g/dL (ref 2.2–3.9)
IGM (IMMUNOGLOBULIN M), SRM: 15 mg/dL — AB (ref 26–217)
IgA: 91 mg/dL (ref 64–422)
IgG (Immunoglobin G), Serum: 801 mg/dL (ref 700–1600)
M Protein SerPl Elph-Mcnc: 0.5 g/dL — ABNORMAL HIGH
Total Protein ELP: 6.6 g/dL (ref 6.0–8.5)

## 2018-12-30 ENCOUNTER — Ambulatory Visit: Payer: Self-pay | Admitting: *Deleted

## 2018-12-30 LAB — UPEP/UIFE/LIGHT CHAINS/TP, 24-HR UR
% BETA, Urine: 26.6 %
ALPHA 1 URINE: 2.8 %
Albumin, U: 33.8 %
Alpha 2, Urine: 15.8 %
FREE KAPPA/LAMBDA RATIO: 19.71 (ref 1.03–31.76)
Free Kappa Lt Chains,Ur: 79.42 mg/L (ref 0.63–113.79)
Free Lambda Lt Chains,Ur: 4.03 mg/L (ref 0.47–11.77)
GAMMA GLOBULIN URINE: 21 %
M-SPIKE %, Urine: 8.9 % — ABNORMAL HIGH
M-Spike, Mg/24 Hr: 18 mg/24 hr — ABNORMAL HIGH
Total Protein, Urine-Ur/day: 199 mg/24 hr — ABNORMAL HIGH (ref 30–150)
Total Protein, Urine: 14.2 mg/dL
Total Volume: 1400

## 2018-12-30 NOTE — Telephone Encounter (Signed)
Pt's spouse, [name] calling to report that the pt has not had a bowel movement in 3 days and is experiencing abdominal cramping and bright red blood on toilet tissue with wiping rectum. Pt's spouse also states that the pt has a history of hemorrhoids.Pt's spouse states that the pt was recently diagnosed with multiple myeloma and was given a prescription for a stool softener and oxycodone. Pt's spouse states that the pt has not been able to pas gas. Pt's spouse states she did try to finger manipulation to give the pt relief of stool but was only able to get tiny pieces. Pt's spouse [name] advised to take the pt to the ED for treatment. Understanding verbalized.  Reason for Disposition . [1] Rectal pain or fullness from fecal impaction (rectum full of stool) AND [2] NOT better after SITZ bath, suppository or enema  Answer Assessment - Initial Assessment Questions 1. STOOL PATTERN OR FREQUENCY: "How often do you pass bowel movements (BMs)?"  (Normal range: tid to q 3 days)  "When was the last BM passed?"       Has not passed stool in the past 3 days 2. STRAINING: "Do you have to strain to have a BM?"      No 3. RECTAL PAIN: "Does your rectum hurt when the stool comes out?" If so, ask: "Do you have hemorrhoids? How bad is the pain?"  (Scale 1-10; or mild, moderate, severe)     yes 4. STOOL COMPOSITION: "Are the stools hard?"      Stool is hard spouse attempted to manually manipulate the stool out but was only able to get tiny pieces 5. BLOOD ON STOOLS: "Has there been any blood on the toilet tissue or on the surface of the BM?" If so, ask: "When was the last time?"     Bright red blood on toilet tissue 6. CHRONIC CONSTIPATION: "Is this a new problem for you?"  If no, ask: "How long have you had this problem?" (days, weeks, months)      Yes this is a recent issue 8. MEDICATIONS: "Have you been taking any new medications?"     Recently started on oxycodone  9. LAXATIVES: "Have you been using any  laxatives or enemas?"  If yes, ask "What, how often, and when was the last time?"     Recently prescribed Senna to take 2 at night 10. CAUSE: "What do you think is causing the constipation?"  Pt is currently taking pain medication after being diagnosed with multiple myeloma 11. OTHER SYMPTOMS: "Do you have any other symptoms?" (e.g., abdominal pain, fever, vomiting)       Abdominal and rectal pain  Answer Assessment - Initial Assessment Questions 1. APPEARANCE of BLOOD: "What color is it?" "Is it passed separately, on the surface of the stool, or mixed in with the stool?"      Bright red on tissue, none in the toilet 2. AMOUNT: "How much blood was passed?"      Only in tissue 3. FREQUENCY: "How many times has blood been passed with the stools?"      Multiple times today but no actual stool passed 4. ONSET: "When was the blood first seen in the stools?" (Days or weeks)      This morning bright red on tissue 5. DIARRHEA: "Is there also some diarrhea?" If so, ask: "How many diarrhea stools were passed in past 24 hours?"      n/a 6. CONSTIPATION: "Do you have constipation?" If so, "How bad is  it?"     yes 7. RECURRENT SYMPTOMS: "Have you had blood in your stools before?" If so, ask: "When was the last time?" and "What happened that time?"      Just this morning 8. BLOOD THINNERS: "Do you take any blood thinners?" (e.g., Coumadin/warfarin, Pradaxa/dabigatran, aspirin)     No 9. OTHER SYMPTOMS: "Do you have any other symptoms?"  (e.g., abdominal pain, vomiting, dizziness, fever)     Cramping like I have to go to the bathroom  Protocols used: CONSTIPATION-A-AH, RECTAL BLEEDING-A-AH

## 2019-01-02 NOTE — Telephone Encounter (Signed)
Called patient and stated that she did not end up going to the ED because she had a bowel movement about an hour after the call. Patient states that things seem to be okay now.

## 2019-01-05 ENCOUNTER — Other Ambulatory Visit: Payer: Self-pay | Admitting: Student

## 2019-01-05 ENCOUNTER — Ambulatory Visit (HOSPITAL_COMMUNITY)
Admission: RE | Admit: 2019-01-05 | Discharge: 2019-01-05 | Disposition: A | Discharge status: Home / Self Care | Payer: Medicare Other | Source: Ambulatory Visit | Attending: Hematology | Admitting: Hematology

## 2019-01-05 DIAGNOSIS — D472 Monoclonal gammopathy: Secondary | ICD-10-CM

## 2019-01-05 DIAGNOSIS — C9000 Multiple myeloma not having achieved remission: Secondary | ICD-10-CM | POA: Diagnosis present

## 2019-01-05 DIAGNOSIS — C7951 Secondary malignant neoplasm of bone: Secondary | ICD-10-CM | POA: Insufficient documentation

## 2019-01-05 LAB — GLUCOSE, CAPILLARY: Glucose-Capillary: 162 mg/dL — ABNORMAL HIGH (ref 70–99)

## 2019-01-05 MED ORDER — FLUDEOXYGLUCOSE F - 18 (FDG) INJECTION: 8.9000 | Freq: Once | INTRAVENOUS | Status: DC | PRN

## 2019-01-06 ENCOUNTER — Ambulatory Visit (HOSPITAL_COMMUNITY)
Admission: RE | Admit: 2019-01-06 | Discharge: 2019-01-06 | Disposition: A | Discharge status: Home / Self Care | Payer: Medicare Other | Source: Ambulatory Visit | Attending: Hematology | Admitting: Hematology

## 2019-01-06 ENCOUNTER — Other Ambulatory Visit: Payer: Self-pay

## 2019-01-06 ENCOUNTER — Encounter (HOSPITAL_COMMUNITY): Payer: Self-pay

## 2019-01-06 DIAGNOSIS — Z6835 Body mass index (BMI) 35.0-35.9, adult: Secondary | ICD-10-CM | POA: Insufficient documentation

## 2019-01-06 DIAGNOSIS — C9000 Multiple myeloma not having achieved remission: Secondary | ICD-10-CM

## 2019-01-06 DIAGNOSIS — Z820 Family history of epilepsy and other diseases of the nervous system: Secondary | ICD-10-CM | POA: Insufficient documentation

## 2019-01-06 DIAGNOSIS — Z886 Allergy status to analgesic agent status: Secondary | ICD-10-CM | POA: Insufficient documentation

## 2019-01-06 DIAGNOSIS — M8580 Other specified disorders of bone density and structure, unspecified site: Secondary | ICD-10-CM | POA: Insufficient documentation

## 2019-01-06 DIAGNOSIS — M25512 Pain in left shoulder: Secondary | ICD-10-CM | POA: Diagnosis not present

## 2019-01-06 DIAGNOSIS — M179 Osteoarthritis of knee, unspecified: Secondary | ICD-10-CM | POA: Insufficient documentation

## 2019-01-06 DIAGNOSIS — E785 Hyperlipidemia, unspecified: Secondary | ICD-10-CM | POA: Insufficient documentation

## 2019-01-06 DIAGNOSIS — C7951 Secondary malignant neoplasm of bone: Secondary | ICD-10-CM

## 2019-01-06 DIAGNOSIS — Z79899 Other long term (current) drug therapy: Secondary | ICD-10-CM | POA: Diagnosis not present

## 2019-01-06 DIAGNOSIS — J45909 Unspecified asthma, uncomplicated: Secondary | ICD-10-CM | POA: Insufficient documentation

## 2019-01-06 DIAGNOSIS — H3530 Unspecified macular degeneration: Secondary | ICD-10-CM | POA: Insufficient documentation

## 2019-01-06 DIAGNOSIS — M899 Disorder of bone, unspecified: Secondary | ICD-10-CM | POA: Insufficient documentation

## 2019-01-06 DIAGNOSIS — Z833 Family history of diabetes mellitus: Secondary | ICD-10-CM | POA: Insufficient documentation

## 2019-01-06 DIAGNOSIS — Z7984 Long term (current) use of oral hypoglycemic drugs: Secondary | ICD-10-CM | POA: Insufficient documentation

## 2019-01-06 DIAGNOSIS — Z7983 Long term (current) use of bisphosphonates: Secondary | ICD-10-CM | POA: Insufficient documentation

## 2019-01-06 DIAGNOSIS — Z880 Allergy status to penicillin: Secondary | ICD-10-CM | POA: Diagnosis not present

## 2019-01-06 DIAGNOSIS — Z882 Allergy status to sulfonamides status: Secondary | ICD-10-CM | POA: Diagnosis not present

## 2019-01-06 DIAGNOSIS — F329 Major depressive disorder, single episode, unspecified: Secondary | ICD-10-CM | POA: Diagnosis not present

## 2019-01-06 DIAGNOSIS — Z8249 Family history of ischemic heart disease and other diseases of the circulatory system: Secondary | ICD-10-CM | POA: Insufficient documentation

## 2019-01-06 DIAGNOSIS — E669 Obesity, unspecified: Secondary | ICD-10-CM | POA: Insufficient documentation

## 2019-01-06 DIAGNOSIS — D472 Monoclonal gammopathy: Secondary | ICD-10-CM

## 2019-01-06 DIAGNOSIS — E119 Type 2 diabetes mellitus without complications: Secondary | ICD-10-CM | POA: Diagnosis not present

## 2019-01-06 DIAGNOSIS — Z7951 Long term (current) use of inhaled steroids: Secondary | ICD-10-CM | POA: Diagnosis not present

## 2019-01-06 LAB — CBC WITH DIFFERENTIAL/PLATELET
Abs Immature Granulocytes: 0.02 10*3/uL (ref 0.00–0.07)
Basophils Absolute: 0 10*3/uL (ref 0.0–0.1)
Basophils Relative: 0 %
EOS PCT: 3 %
Eosinophils Absolute: 0.2 10*3/uL (ref 0.0–0.5)
HEMATOCRIT: 39.5 % (ref 36.0–46.0)
Hemoglobin: 13.3 g/dL (ref 12.0–15.0)
Immature Granulocytes: 0 %
Lymphocytes Relative: 28 %
Lymphs Abs: 1.8 10*3/uL (ref 0.7–4.0)
MCH: 31 pg (ref 26.0–34.0)
MCHC: 33.7 g/dL (ref 30.0–36.0)
MCV: 92.1 fL (ref 80.0–100.0)
Monocytes Absolute: 1.1 10*3/uL — ABNORMAL HIGH (ref 0.1–1.0)
Monocytes Relative: 16 %
Neutro Abs: 3.5 10*3/uL (ref 1.7–7.7)
Neutrophils Relative %: 53 %
Platelets: 256 10*3/uL (ref 150–400)
RBC: 4.29 MIL/uL (ref 3.87–5.11)
RDW: 12.1 % (ref 11.5–15.5)
WBC: 6.6 10*3/uL (ref 4.0–10.5)
nRBC: 0 % (ref 0.0–0.2)

## 2019-01-06 LAB — PROTIME-INR
INR: 0.94
Prothrombin Time: 12.4 seconds (ref 11.4–15.2)

## 2019-01-06 LAB — GLUCOSE, CAPILLARY: Glucose-Capillary: 158 mg/dL — ABNORMAL HIGH (ref 70–99)

## 2019-01-06 LAB — APTT: aPTT: 27 seconds (ref 24–36)

## 2019-01-06 MED ORDER — FENTANYL CITRATE (PF) 100 MCG/2ML IJ SOLN
INTRAMUSCULAR | Status: AC | PRN
  Administered 2019-01-06 (×2): 50 ug via INTRAVENOUS

## 2019-01-06 MED ORDER — FENTANYL CITRATE (PF) 100 MCG/2ML IJ SOLN
INTRAMUSCULAR | Status: AC
  Filled 2019-01-06: qty 2

## 2019-01-06 MED ORDER — SODIUM CHLORIDE 0.9 % IV SOLN
INTRAVENOUS | Status: AC
  Filled 2019-01-06: qty 250

## 2019-01-06 MED ORDER — SODIUM CHLORIDE 0.9 % IV SOLN
INTRAVENOUS | Status: DC
  Administered 2019-01-06: 08:00:00 via INTRAVENOUS

## 2019-01-06 MED ORDER — MIDAZOLAM HCL 2 MG/2ML IJ SOLN
INTRAMUSCULAR | Status: AC | PRN
  Administered 2019-01-06 (×2): 1 mg via INTRAVENOUS

## 2019-01-06 MED ORDER — MIDAZOLAM HCL 2 MG/2ML IJ SOLN
INTRAMUSCULAR | Status: AC
  Filled 2019-01-06: qty 4

## 2019-01-06 MED ORDER — LIDOCAINE HCL (PF) 1 % IJ SOLN
INTRAMUSCULAR | Status: AC | PRN
  Administered 2019-01-06: 10 mL

## 2019-01-06 NOTE — Discharge Instructions (Signed)
Moderate Conscious Sedation, Adult, Care After These instructions provide you with information about caring for yourself after your procedure. Your health care provider may also give you more specific instructions. Your treatment has been planned according to current medical practices, but problems sometimes occur. Call your health care provider if you have any problems or questions after your procedure. What can I expect after the procedure? After your procedure, it is common:  To feel sleepy for several hours.  To feel clumsy and have poor balance for several hours.  To have poor judgment for several hours.  To vomit if you eat too soon. Follow these instructions at home: For at least 24 hours after the procedure:   Do not: ? Participate in activities where you could fall or become injured. ? Drive. ? Use heavy machinery. ? Drink alcohol. ? Take sleeping pills or medicines that cause drowsiness. ? Make important decisions or sign legal documents. ? Take care of children on your own.  Rest. Eating and drinking  Follow the diet recommended by your health care provider.  If you vomit: ? Drink water, juice, or soup when you can drink without vomiting. ? Make sure you have little or no nausea before eating solid foods. General instructions  Have a responsible adult stay with you until you are awake and alert.  Take over-the-counter and prescription medicines only as told by your health care provider.  If you smoke, do not smoke without supervision.  Keep all follow-up visits as told by your health care provider. This is important. Contact a health care provider if:  You keep feeling nauseous or you keep vomiting.  You feel light-headed.  You develop a rash.  You have a fever. Get help right away if:  You have trouble breathing. This information is not intended to replace advice given to you by your health care provider. Make sure you discuss any questions you have  with your health care provider. Document Released: 09/20/2013 Document Revised: 05/04/2016 Document Reviewed: 03/21/2016 Elsevier Interactive Patient Education  2019 [employer] may remove your dressing tomorrow and shower tomorrow.  Bone Marrow Aspiration and Bone Marrow Biopsy, Adult, Care After This sheet gives you information about how to care for yourself after your procedure. Your health care provider may also give you more specific instructions. If you have problems or questions, contact your health care provider. What can I expect after the procedure? After the procedure, it is common to have:  Mild pain and tenderness.  Swelling.  Bruising. Follow these instructions at home: Puncture site care      Follow instructions from your health care provider about how to take care of the puncture site. Make sure you: ? Wash your hands with soap and water before you change your bandage (dressing). If soap and water are not available, use hand sanitizer. ? Change your dressing as told by your health care provider.  Check your puncture siteevery day for signs of infection. Check for: ? More redness, swelling, or pain. ? More fluid or blood. ? Warmth. ? Pus or a bad smell. General instructions  Take over-the-counter and prescription medicines only as told by your health care provider.  Do not take baths, swim, or use a hot tub until your health care provider approves. Ask if you can take a shower or have a sponge bath.  Return to your normal activities as told by your health care provider. Ask your health care provider what activities are safe for  you.  Do not drive for 24 hours if you were given a medicine to help you relax (sedative) during your procedure.  Keep all follow-up visits as told by your health care provider. This is important. Contact a health care provider if:  Your pain is not controlled with medicine. Get help right away if:  You have a  fever.  You have more redness, swelling, or pain around the puncture site.  You have more fluid or blood coming from the puncture site.  Your puncture site feels warm to the touch.  You have pus or a bad smell coming from the puncture site. These symptoms may represent a serious problem that is an emergency. Do not wait to see if the symptoms will go away. Get medical help right away. Call your local emergency services (911 in the [place] Do not drive yourself to the hospital. Summary  After the procedure, it is common to have mild pain, tenderness, swelling, and bruising.  Follow instructions from your health care provider about how to take care of the puncture site.  Get help right away if you have any symptoms of infection or if you have more blood or fluid coming from the puncture site. This information is not intended to replace advice given to you by your health care provider. Make sure you discuss any questions you have with your health care provider. Document Released: 06/19/2005 Document Revised: 03/15/2018 Document Reviewed: 05/13/2016 Elsevier Interactive Patient Education  2019 [employer].

## 2019-01-06 NOTE — H&P (Signed)
Chief Complaint: Lytic bone lesions  Referring Physician(s): Brunetta Genera  Supervising Physician: Aletta Edouard  Patient Status: [facility] - Out-pt  History of Present Illness: Faith Orr is a 78 y.o. female with medical history pertinent for DM, asthma, obesity, left shoulder pain who was seen by Dr. Irene Limbo for evaluation of bone lesions.  Around 2-3 months ago while standing at the stove, and otherwise feeling normally, she felt "something snap that took her breath away" in her mid back as she stretched to get something.  Bone Scan done on 12/13/18 showed= Multifocal uptake throughout the skeleton, consistent with diffuse metastatic disease. Primary tumor is not specified. 2. Uptake in the proximal right femur, consistent with lytic lesions. 3. Uptake in the ribs bilaterally as described. 4. Lesions in the proximal left humerus. 5. Diffuse uptake throughout the skull consistent with metastatic disease. 6. Right paramedian uptake at the manubrium.  PET scan done yesterday showed = 1. Innumerable lytic lesions in the skeleton compatible with myeloma. Most of the larger lesions are hypermetabolic, for example including a left proximal humeral shaft lesion with maximum SUV of 8.1 and a 2.8 cm lesion in the left T9 vertebral body with maximum SUV 5.1. Most of the smaller lytic lesions, and some of the larger lesions, do not demonstrate accentuated metabolic activity. 2. 1.2 cm in short axis lymph node in the left parapharyngeal space is hypermetabolic with maximum SUV 11.8. I do not see a separate mass in the head and neck to give rise to this hypermetabolic lymph node. 3. Mosaic attenuation in the lower lobes, nonspecific possibly from air trapping. 4.  Aortic Atherosclerosis (ICD10-I70.0). 5. Heterogeneous activity in the liver, making it hard to exclude small liver lesions. Consider hepatic protocol MRI with and without contrast for definitive assessment. Nonobstructive  right nephrolithiasis. Old granulomatous disease.  She is here today for a bone marrow biopsy.  No nausea/vomiting. No Fever/chills. ROS negative.   Past Medical History:  Diagnosis Date  . Allergy    seasonal  . Asthma   . DEPRESSION   . DIABETES MELLITUS, TYPE II   . Diverticulosis   . HYPERLIPIDEMIA   . Macular degeneration of left eye    mild, Dr.Hecker  . Obesity, unspecified   . Osteoarthritis of both knees   . OSTEOPENIA   . Osteopenia   . URINARY INCONTINENCE     Past Surgical History:  Procedure Laterality Date  . BREAST SURGERY    . CATARACT EXTRACTION Left 05/24/2018  . CESAREAN SECTION  01/1973  . FRACTURE SURGERY    . left wrist surgery  2008   By Dr. Latanya Maudlin  . right ankle  1994    Allergies: Penicillins; Aleve [naproxen sodium]; and Sulfonamide derivatives  Medications: Prior to Admission medications   Medication Sig Start Date End Date Taking? Authorizing Provider  Blood Glucose Monitoring Suppl (FREESTYLE FREEDOM LITE) W/DEVICE KIT Use to check blood sugars twice a day Dx 250.00 06/01/14  Yes Rowe Clack, MD  Calcium Carbonate-Vitamin D (CALCIUM 600+D HIGH POTENCY) 600-400 MG-UNIT per tablet Take 1 tablet by mouth 2 (two) times daily.    Yes [provider]  glucose blood (FREESTYLE LITE) test strip CHECK BLOOD SUGAR TWICE DAILY AS DIRECTED Dx 250.00 07/13/14  Yes Rowe Clack, MD  ibuprofen (ADVIL) 200 MG tablet Take 600 mg by mouth 4 (four) times daily.    Yes [provider]  Lancets (FREESTYLE) lancets Use twice daily to check sugars. 04/15/16  Yes Hoyt Koch, MD  metFORMIN (GLUCOPHAGE-XR) 500 MG 24 hr tablet Take 3 tablets (1,500 mg total) by mouth daily with breakfast. 06/06/18  Yes Hoyt Koch, MD  Multiple Vitamins-Minerals (ICAPS) CAPS Take 1 capsule by mouth daily.     Yes [provider]  oxyCODONE (OXY IR/ROXICODONE) 5 MG immediate release tablet Take 1-2 tablets (5-10 mg total)  by mouth every 6 (six) hours as needed for moderate pain or severe pain. 12/27/18  Yes Brunetta Genera, MD  senna-docusate (SENNA S) 8.6-50 MG tablet Take 2 tablets by mouth at bedtime. 12/27/18  Yes Brunetta Genera, MD  sertraline (ZOLOFT) 50 MG tablet TAKE 1 TABLET BY MOUTH DAILY 11/01/18  Yes Hoyt Koch, MD  simvastatin (ZOCOR) 20 MG tablet TAKE 1 TABLET(20 MG) BY MOUTH DAILY 11/01/18  Yes Hoyt Koch, MD  albuterol (PROVENTIL HFA;VENTOLIN HFA) 108 (90 Base) MCG/ACT inhaler INHALE 1 TO 2 PUFFS INTO THE LUNGS EVERY 6 HOURS AS NEEDED FOR WHEEZING OR SHORTNESS OF BREATH 03/09/18   Hoyt Koch, MD  alendronate (FOSAMAX) 70 MG tablet Take 1 tablet (70 mg total) by mouth every 7 (seven) days. Take with a full glass of water on an empty stomach. 12/06/18   Hoyt Koch, MD  doxycycline (PERIOSTAT) 20 MG tablet Take 1 tablet by mouth as needed. 03/09/18   [provider]  fluticasone (FLONASE) 50 MCG/ACT nasal spray Place 1 spray into both nostrils daily. 03/09/18   Hoyt Koch, MD  ketorolac (ACULAR) 0.4 % SOLN 1 drop 4 (four) times daily.    [provider]  montelukast (SINGULAIR) 10 MG tablet TAKE 1 TABLET BY MOUTH DAILY AS NEEDED 05/11/18   Hoyt Koch, MD  pantoprazole (PROTONIX) 20 MG tablet TAKE 1 TABLET(20 MG) BY MOUTH DAILY 11/19/17   Hoyt Koch, MD  predniSONE (DELTASONE) 20 MG tablet Take 2 tablets (40 mg total) by mouth daily with breakfast. Patient not taking: Reported on 12/27/2018 12/05/18   Hoyt Koch, MD  traMADol (ULTRAM) 50 MG tablet Take 1 tablet (50 mg total) by mouth every 8 (eight) hours as needed. 12/15/18   Hoyt Koch, MD  Vitamin D, Ergocalciferol, (DRISDOL) 1.25 MG (50000 UT) CAPS capsule Take 1 capsule (50,000 Units total) by mouth every 7 (seven) days. 12/12/18   Hoyt Koch, MD     Family History  Problem Relation Age of Onset  . Diabetes Father   .  Hyperlipidemia Father   . Heart disease Father   . Cancer Father   . Hypertension Father   . Colon cancer Paternal Grandmother 90  . Osteoporosis Mother   . Protein S deficiency Mother   . Hyperlipidemia Mother   . Multiple sclerosis Daughter   . Cancer Other        bladder  . Breast cancer Neg Hx     Social History   Socioeconomic History  . Marital status: Married    Spouse name: Not on file  . Number of children: 1  . Years of education: Not on file  . Highest education level: Not on file  Occupational History    Employer: [employer]  Social Needs  . Financial resource strain: Patient refused  . Food insecurity:    Worry: Patient refused    Inability: Patient refused  . Transportation needs:    Medical: Patient refused    Non-medical: Patient refused  Tobacco Use  . Smoking status: Never Smoker  .  Smokeless tobacco: Never Used  . Tobacco comment: Lives with partner [name]) and son  Substance and Sexual Activity  . Alcohol use: No    Alcohol/week: 0.0 standard drinks  . Drug use: No  . Sexual activity: Never    Partners: Female    Birth control/protection: Post-menopausal    Comment: Lives with female partner (annette hicks) and 65 yo son  Lifestyle  . Physical activity:    Days per week: Patient refused    Minutes per session: Patient refused  . Stress: Patient refused  Relationships  . Social connections:    Talks on phone: Patient refused    Gets together: Patient refused    Attends religious service: Patient refused    Active member of club or organization: Patient refused    Attends meetings of clubs or organizations: Patient refused    Relationship status: Patient refused  Other Topics Concern  . Not on file  Social History Narrative  . Not on file     Review of Systems: A 12 point ROS discussed and pertinent positives are indicated in the HPI above.  All other systems are negative.  Review of Systems  Vital Signs: BP  113/70 (BP Location: Right Arm)   Pulse 83   Temp 97.6 F (36.4 C) (Oral)   Resp 18   LMP 10/09/2012   SpO2 94%   Physical Exam Vitals signs reviewed.  Constitutional:      Appearance: She is obese.  HENT:     Head: Normocephalic and atraumatic.  Eyes:     Extraocular Movements: Extraocular movements intact.  Neck:     Musculoskeletal: Normal range of motion.  Cardiovascular:     Rate and Rhythm: Normal rate and regular rhythm.  Pulmonary:     Effort: Pulmonary effort is normal.     Breath sounds: Normal breath sounds.  Abdominal:     Palpations: Abdomen is soft.     Tenderness: There is no abdominal tenderness.  Musculoskeletal: Normal range of motion.  Skin:    General: Skin is warm and dry.  Neurological:     General: No focal deficit present.     Mental Status: She is alert and oriented to person, place, and time.  Psychiatric:        Mood and Affect: Mood normal.        Behavior: Behavior normal.        Thought Content: Thought content normal.        Judgment: Judgment normal.     Imaging: Nm Bone Scan Whole Body  Result Date: 12/13/2018 CLINICAL DATA:  Lytic bone lesions of the right femur. Severe right hip and femur pain. EXAM: NUCLEAR MEDICINE WHOLE BODY BONE SCAN TECHNIQUE: Whole body anterior and posterior images were obtained approximately 3 hours after intravenous injection of radiopharmaceutical. RADIOPHARMACEUTICALS:  20.3 mCi Technetium-59mMDP IV COMPARISON:  CT of the right femur 12/13/2018. Right femur radiographs 12/05/2018. FINDINGS: Uptake in the proximal right femur corresponds to the lytic lesion. Additional uptake is noted anteriorly in the right second and third rib, more laterally in the fourth and fifth ribs, anteriorly the seventh rib. More lateral uptake is present in the left eighth rib. There is uptake in the proximal left humerus. Multiple foci are present in posterior ribs bilaterally. Extensive uptake is present within the calvarium.  Uptake is present in the left side of L4. IMPRESSION: 1. Multifocal uptake throughout the skeleton, consistent with diffuse metastatic disease. Primary tumor is not specified. 2. Uptake  in the proximal right femur, consistent with lytic lesions. 3. Uptake in the ribs bilaterally as described. 4. Lesions in the proximal left humerus. 5. Diffuse uptake throughout the skull consistent with metastatic disease. 6. Right paramedian uptake at the manubrium. Electronically Signed   By: San Morelle M.D.   On: 12/13/2018 15:29   Ct Femur Right Wo Contrast  Result Date: 12/13/2018 CLINICAL DATA:  Right femoral lytic bone lesion seen on recent x-ray dated 12/05/2018. Pain all over for 8 weeks. Status post fall October 2018. EXAM: CT OF THE LOWER RIGHT EXTREMITY WITHOUT CONTRAST TECHNIQUE: Multidetector CT imaging of the right lower extremity was performed according to the standard protocol. COMPARISON:  None. FINDINGS: Bones/Joint/Cartilage No fracture or dislocation. Normal alignment. No joint effusion. Lytic bone lesion in the proximal femoral diaphysis posteriorly measuring 16 x 12 mm with a soft tissue component extending through the cortex. Second lytic cortically based bone lesion in the mid femoral diaphysis. Multiple other subtle lytic bone lesions within the cortex of the mid and distal femoral diaphysis. Lytic bone lesion in the left inferior pubic ramus. Ligaments Ligaments are suboptimally evaluated by CT. Muscles and Tendons Muscles are normal.  No muscle atrophy. Soft tissue No fluid collection or hematoma.  No soft tissue mass. IMPRESSION: 1. Numerous lytic lesions involving the right femur and a lytic lesion in the left inferior pubic ramus. Overall appearance is most concerning for multiple myeloma. Electronically Signed   By: Kathreen Devoid   On: 12/13/2018 15:01   Nm Pet Image Initial (pi) Whole Body  Result Date: 01/05/2019 CLINICAL DATA:  Initial treatment strategy for multiple myeloma.  EXAM: NUCLEAR MEDICINE PET WHOLE BODY TECHNIQUE: 8.9 mCi F-18 FDG was injected intravenously. Full-ring PET imaging was performed from the skull base to thigh after the radiotracer. CT data was obtained and used for attenuation correction and anatomic localization. Fasting blood glucose: 162 mg/dl COMPARISON:  Bone scan of 12/13/2018 FINDINGS: Mediastinal blood pool activity: SUV max 2.9 HEAD/NECK: A lymph node measuring 1.2 cm in short axis in the left parapharyngeal space on image 47/4 has a maximum SUV 11.8. Incidental CT findings: none CHEST: No significant abnormal hypermetabolic activity in this region. Incidental CT findings: Mild mosaic attenuation particularly favoring the lower lobes. Old granulomatous disease. Aortic atherosclerosis. ABDOMEN/PELVIS: Accentuated heterogeneity of activity in the liver. One area of heterogeneity posteriorly in the right hepatic lobe has a maximum SUV of 5.1 as compared to the general background liver activity with maximum SUV of 4.1. A hypodense lesion posteriorly in the dome of the right hepatic lobe on image 105/4 is not discernibly hypermetabolic. Similarly there is some mild heterogeneity of the splenic activity. Physiologic activity throughout the bowel. Incidental CT findings: Old granulomatous disease involving the spleen. 4 mm nonobstructive right mid kidney calculus in the collecting system. Aortoiliac atherosclerotic vascular disease. Scattered sigmoid colon diverticula. SKELETON: Innumerable scattered small lytic lesions are present throughout the skeleton. Some of these are more confluent and measurably hypermetabolic, for example the larger lytic lesion of the left proximal humerus which has a maximum SUV of 8.1. There is a likely pathologic fracture of the right third rib with maximum SUV 3.7. Likely pathologic fracture of the right fifth rib with maximum SUV 3.5. Likely pathologic fracture of the left ninth rib posterolaterally. A 2.8 by 1.9 cm lytic lesion of  the left side of the T9 vertebral body has some cortical destruction on image 107/4, and a maximum SUV of 5.1. An index lesion posteriorly in  the right proximal femoral shaft measures 2.0 cm in diameter on image 216/4 with maximum SUV 4.0. Some of the lytic lesions, such as the 1.8 cm lytic lesion of the left inferior pubis and inferior pubic ramus shown on image 200/4, are not appreciably hypermetabolic. Incidental CT findings: Postoperative findings in the right ankle with K-wires along the malleoli. EXTREMITIES: No significant abnormal hypermetabolic activity in this region. Incidental CT findings: none IMPRESSION: 1. Innumerable lytic lesions in the skeleton compatible with myeloma. Most of the larger lesions are hypermetabolic, for example including a left proximal humeral shaft lesion with maximum SUV of 8.1 and a 2.8 cm lesion in the left T9 vertebral body with maximum SUV 5.1. Most of the smaller lytic lesions, and some of the larger lesions, do not demonstrate accentuated metabolic activity. 2. 1.2 cm in short axis lymph node in the left parapharyngeal space is hypermetabolic with maximum SUV 11.8. I do not see a separate mass in the head and neck to give rise to this hypermetabolic lymph node. 3. Mosaic attenuation in the lower lobes, nonspecific possibly from air trapping. 4.  Aortic Atherosclerosis (ICD10-I70.0). 5. Heterogeneous activity in the liver, making it hard to exclude small liver lesions. Consider hepatic protocol MRI with and without contrast for definitive assessment. Nonobstructive right nephrolithiasis. Old granulomatous disease. Electronically Signed   By: Van Clines M.D.   On: 01/05/2019 09:13    Labs:  CBC: Recent Labs    06/06/18 1634 12/08/18 1045 12/27/18 1448 01/06/19 0757  WBC 6.7 7.1 7.5 6.6  HGB 14.3 13.1 13.1 13.3  HCT 41.1 38.0 38.2 39.5  PLT 239.0 245.0 245 256    COAGS: Recent Labs    01/06/19 0757  INR 0.94  APTT 27    BMP: Recent Labs     06/06/18 1634 12/08/18 1045 12/27/18 1448  NA 139 136 137  K 4.1 3.7 4.1  CL 104 104 106  CO2 _0 GLUCOSE 150* 279* 135*  BUN 18 24* 23  CALCIUM 10.4 10.3 10.7*  CREATININE 0.62 0.63 0.64  GFRNONAA  --   --  >60  GFRAA  --   --  >60    LIVER FUNCTION TESTS: Recent Labs    06/06/18 1634 07/21/18 1441 12/08/18 1045 12/27/18 1448  BILITOT 0.5 0.5 0.4 0.5  AST 92* 60* 41* 63*  ALT 85* 59* 46* 73*  ALKPHOS 89 90 93 126  PROT 7.1 6.9 6.0*  6.4 6.9  ALBUMIN 4.3 4.0 3.9 3.8    TUMOR MARKERS: No results for input(s): AFPTM, CEA, CA199, CHROMGRNA in the last 8760 hours.  Assessment and Plan:  Lytic bone lesions worrisome for either metastatic disease or metastatic disease.  Will proceed with bone marrow biopsy today by Dr. Kathlene Cote.  Risks and benefits discussed with the patient including, but not limited to bleeding, infection, damage to adjacent structures or low yield requiring additional tests.  All of the patient's questions were answered, patient is agreeable to proceed. Consent signed and in chart.  Thank you for this interesting consult.  I greatly enjoyed meeting MADALINA ROSMAN and look forward to participating in their care.  A copy of this report was sent to the requesting provider on this date.  Electronically Signed: Murrell Redden, PA-C   01/06/2019, 8:56 AM      I spent a total of  30 Minutes in face to face in clinical consultation, greater than 50% of which was counseling/coordinating care for bone marrow biopsy.

## 2019-01-06 NOTE — Procedures (Signed)
Interventional Radiology Procedure Note  Procedure: CT guided bone marrow aspiration and biopsy  Complications: None  EBL: < 10 mL  Findings: Aspirate and core biopsy performed of bone marrow in right iliac bone.  Plan: Bedrest supine x 1 hrs  Tamalyn Wadsworth T. Seyed Heffley, M.D Pager:  [phone]   

## 2019-01-09 NOTE — Progress Notes (Signed)
HEMATOLOGY/ONCOLOGY CONSULTATION NOTE  Date of Service: 01/10/2019  Patient Care Team: Hoyt Koch, MD as PCP - General (Internal Medicine) Lafayette Dragon, MD (Inactive) as Consulting Physician (Gastroenterology) Megan Salon, MD as Consulting Physician (Gynecology) Melrose Nakayama, MD as Consulting Physician (Orthopedic Surgery) Deneise Lever, MD as Consulting Physician (Pulmonary Disease) [name], MD (Ophthalmology)  CHIEF COMPLAINTS/PURPOSE OF CONSULTATION:  Lytic bone lesions  HISTORY OF PRESENTING ILLNESS:   Faith Orr is a wonderful 78 y.o. female who has been referred to [name] by Dr. Pricilla Holm for evaluation and management of Lytic bone lesions. She is accompanied today by her son in law, and her partner is present via phone. The pt reports that she is doing well overall.   The pt notes that 2-3 months ago while standing at the stove, and otherwise feeling normally, she felt "something snap that took her breath away" in her mid back as she stretched to get something. The pt notes that she saw a chiropractor twice due to her back stiffness, which did not help. The pt then described her new bone pains to her PCP on 12/05/18, and subsequent imaging, as noted below, revealed concerns for numerous bone lesions. She has begun 74m Fosamax. The pt notes that she had hip pain in 2018, and that an XR at that time did not reveal any lesions.  The pt notes that most of her pain is concentrated to her left shoulder presently, and with minimal movement of the arm. She endorses pain radiating into her left arm and notes that her hand is swollen in the mornings when she wakes up. She also endorses present back pain and right hip pain, worse when she walks. She has not yet seen orthopedics. The pt reports that she is needing to take 607mAdvil every 4-6 hours as Tramadol alone has not been able to alleviate her pain. The pt denies any unexpected weight loss, fevers,  chills, or night sweats. The pt notes that her urine is a very dark color presently, but denies overt blood in the urine, underpants, nor tissue paper. The pt notes that in the last two weeks she has had some soreness in her head, but denies new headaches or changes in vision. The pt notes that she has been urinating more frequently overall, but has been trying to stay better hydrated as well.  The pt notes that she has been compliant with annual mammograms.   The pt notes that she had a cyst in her right breast which was removed in the past. She fractured her left wrist in 2008 after falling down stairs. The pt endorses history of fatty liver.   The pt denies ever smoking cigarettes and endorses significant second hand smoke exposure with a previous marriage.   Of note prior to the patient's visit today, pt has had a Bone Scan completed on 12/13/18 with results revealing Multifocal uptake throughout the skeleton, consistent with diffuse metastatic disease. Primary tumor is not specified. 2. Uptake in the proximal right femur, consistent with lytic lesions. 3. Uptake in the ribs bilaterally as described. 4. Lesions in the proximal left humerus. 5. Diffuse uptake throughout the skull consistent with metastatic disease. 6. Right paramedian uptake at the manubrium.  Most recent lab results (12/08/18) of CBC w/diff and CMP is as follows: all values are WNL except for Glucose at 279, BUN at 24, AST at 41, ALT at 46. 12/08/18 SPEP revealed all values WNL except for Total Protein  at 6.0, Albumin at 3.6, Gamma globulin at 0.7, and M spike at 0.5g  On review of systems, pt reports significant left shoulder pain, back pain, right hip pain, dark urine, and denies fevers, chills, night sweats, unexpected weight loss, changes in bowel habits, changes in breathing, cough, new respiratory symptoms, changes in vision, abdominal pains, leg swelling, and any other symptoms.   On PMHx the pt reports fatty liver, and  denies blood clots.  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 Denies ever smoking.  On Family Hx the pt reports maternal grandmother with colon cancer. Father with bladder cancer and amyloidosis (pt notes that this could have been misdiagnosis). Mother with Protein S deficiency and polymyalgia rheumatica.  Interval History:   Faith Orr returns today for management and evaluation of her Bone Metastases. The patient's last visit with [name] was on 12/27/18. She is accompanied today by her sister-in-law and her son-in-law; her partner is present on the phone. The pt reports that she is doing well overall.   The pt reports that she had a severe impaction in the interim, which has been "taken care of," and she has increased Senna S to BID. The pt is also now taking fiber supplements BID. She notes that her left shoulder continues to be very painful. The pt notes that she is having pain in her mid-back just on the left side. She denies having new right hip pain, but notes that it doesn't feel "stable." The pt is taking two 5m Oxycodone, every 6 hours. She notes getting up from chairs and beds is difficult.   The pt had chicken pox as a child, has received shingles vaccine, has not had shingles and has never had neuropathy. The pt has given birth to children, has had two surgeries, and has never had a blood clot in her 737years. The patient's mother had a pulmonary embolus, the pt notes her mother had Protein S deficiency.  Of note since the patient's last visit, pt has had a PET/CT completed on 01/05/19 with results revealing Innumerable lytic lesions in the skeleton compatible with myeloma. Most of the larger lesions are hypermetabolic, for example including a left proximal humeral shaft lesion with maximum SUV of 8.1 and a 2.8 cm lesion in the left T9 vertebral body with maximum SUV 5.1. Most of the smaller lytic lesions, and some of the larger lesions, do not  demonstrate accentuated metabolic activity. 2. 1.2 cm in short axis lymph node in the left parapharyngeal space is hypermetabolic with maximum SUV 11.8. I do not see a separate mass in the head and neck to give rise to this hypermetabolic lymph node. 3. Mosaic attenuation in the lower lobes, nonspecific possibly from air trapping. 4.  Aortic Atherosclerosis 5. Heterogeneous activity in the liver, making it hard to exclude small liver lesions. Consider hepatic protocol MRI with and without contrast for definitive assessment. Nonobstructive right nephrolithiasis. Old granulomatous disease.  Lab results (01/06/19) of CBC w/diff is as follows: all values are WNL except for Monocytes abs at 1.1k. 12/27/18 CMP revealed all values WNL except for Glucose at 135, Calcium at 10.7, AST at 63, ALT at 73 12/29/18 24 HR UPEP revealed all values WNL except for Total Protein at 199, M Spike % at 8.9%, and M spike at 137m1/14/20 MMP revealed all values WNL except for IgM at 15, M Protein at 0.5g with IgG lambda specificity  12/27/18 SFLC revealed Kappa  at 5.1, Lambda at 40.3, and K:L ratio of 0.13 12/27/18 Beta 2 microglobulin at 1.7, LDH at 162, Sed Rate at 8  On review of systems, pt reports left sided mid-back pain, left shoulder pain, right hip weakness, stable energy levels, resolved constipation, and denies right hip pain, abdominal pains, and any other symptoms.  MEDICAL HISTORY:  Past Medical History:  Diagnosis Date  . Allergy    seasonal  . Asthma   . DEPRESSION   . DIABETES MELLITUS, TYPE II   . Diverticulosis   . HYPERLIPIDEMIA   . Macular degeneration of left eye    mild, Dr.Hecker  . Obesity, unspecified   . Osteoarthritis of both knees   . OSTEOPENIA   . Osteopenia   . URINARY INCONTINENCE     SURGICAL HISTORY: Past Surgical History:  Procedure Laterality Date  . BREAST SURGERY    . CATARACT EXTRACTION Left 05/24/2018  . CESAREAN SECTION  01/1973  . FRACTURE SURGERY    . left wrist  surgery  2008   By Dr. Latanya Maudlin  . right ankle  1994    SOCIAL HISTORY: Social History   Socioeconomic History  . Marital status: Married    Spouse name: Not on file  . Number of children: 1  . Years of education: Not on file  . Highest education level: Not on file  Occupational History    Employer: [employer]  Social Needs  . Financial resource strain: Patient refused  . Food insecurity:    Worry: Patient refused    Inability: Patient refused  . Transportation needs:    Medical: Patient refused    Non-medical: Patient refused  Tobacco Use  . Smoking status: Never Smoker  . Smokeless tobacco: Never Used  . Tobacco comment: Lives with partner [name]) and son  Substance and Sexual Activity  . Alcohol use: No    Alcohol/week: 0.0 standard drinks  . Drug use: No  . Sexual activity: Never    Partners: Female    Birth control/protection: Post-menopausal    Comment: Lives with female partner (annette hicks) and 56 yo son  Lifestyle  . Physical activity:    Days per week: Patient refused    Minutes per session: Patient refused  . Stress: Patient refused  Relationships  . Social connections:    Talks on phone: Patient refused    Gets together: Patient refused    Attends religious service: Patient refused    Active member of club or organization: Patient refused    Attends meetings of clubs or organizations: Patient refused    Relationship status: Patient refused  . Intimate partner violence:    Fear of current or ex partner: Patient refused    Emotionally abused: Patient refused    Physically abused: Patient refused    Forced sexual activity: Patient refused  Other Topics Concern  . Not on file  Social History Narrative  . Not on file    FAMILY HISTORY: Family History  Problem Relation Age of Onset  . Diabetes Father   . Hyperlipidemia Father   . Heart disease Father   . Cancer Father   . Hypertension Father   . Colon cancer Paternal  Grandmother 48  . Osteoporosis Mother   . Protein S deficiency Mother   . Hyperlipidemia Mother   . Multiple sclerosis Daughter   . Cancer Other        bladder  . Breast cancer Neg Hx     ALLERGIES:  is  allergic to penicillins; aleve [naproxen sodium]; and sulfonamide derivatives.  MEDICATIONS:  Current Outpatient Medications  Medication Sig Dispense Refill  . albuterol (PROVENTIL HFA;VENTOLIN HFA) 108 (90 Base) MCG/ACT inhaler INHALE 1 TO 2 PUFFS INTO THE LUNGS EVERY 6 HOURS AS NEEDED FOR WHEEZING OR SHORTNESS OF BREATH 54 g 1  . Blood Glucose Monitoring Suppl (FREESTYLE FREEDOM LITE) W/DEVICE KIT Use to check blood sugars twice a day Dx 250.00 1 each 0  . Calcium Carbonate-Vitamin D (CALCIUM 600+D HIGH POTENCY) 600-400 MG-UNIT per tablet Take 1 tablet by mouth 2 (two) times daily.     Marland Kitchen glucose blood (FREESTYLE LITE) test strip CHECK BLOOD SUGAR TWICE DAILY AS DIRECTED Dx 250.00 180 each 3  . Lancets (FREESTYLE) lancets Use twice daily to check sugars. 100 each 11  . metFORMIN (GLUCOPHAGE-XR) 500 MG 24 hr tablet Take 3 tablets (1,500 mg total) by mouth daily with breakfast. 270 tablet 3  . Multiple Vitamins-Minerals (ICAPS) CAPS Take 1 capsule by mouth daily.      Marland Kitchen oxyCODONE (OXY IR/ROXICODONE) 5 MG immediate release tablet Take 1-2 tablets (5-10 mg total) by mouth every 6 (six) hours as needed for moderate pain or severe pain. 60 tablet 0  . pantoprazole (PROTONIX) 20 MG tablet TAKE 1 TABLET(20 MG) BY MOUTH DAILY 30 tablet 5  . senna-docusate (SENNA S) 8.6-50 MG tablet Take 2 tablets by mouth at bedtime. 60 tablet 1  . sertraline (ZOLOFT) 50 MG tablet TAKE 1 TABLET BY MOUTH DAILY 90 tablet 1  . simvastatin (ZOCOR) 20 MG tablet TAKE 1 TABLET(20 MG) BY MOUTH DAILY 90 tablet 1  . Vitamin D, Ergocalciferol, (DRISDOL) 1.25 MG (50000 UT) CAPS capsule Take 1 capsule (50,000 Units total) by mouth every 7 (seven) days. 12 capsule 0  . doxycycline (PERIOSTAT) 20 MG tablet Take 1 tablet by  mouth as needed.  2  . fluticasone (FLONASE) 50 MCG/ACT nasal spray Place 1 spray into both nostrils daily. (Patient not taking: Reported on 01/10/2019) 16 g 2  . ibuprofen (ADVIL) 200 MG tablet Take 600 mg by mouth 4 (four) times daily.     Marland Kitchen ketorolac (ACULAR) 0.4 % SOLN 1 drop 4 (four) times daily.    . montelukast (SINGULAIR) 10 MG tablet TAKE 1 TABLET BY MOUTH DAILY AS NEEDED (Patient not taking: Reported on 01/10/2019) 30 tablet 3  . ondansetron (ZOFRAN) 8 MG tablet Take 1 tablet (8 mg total) by mouth every 8 (eight) hours as needed for nausea. 30 tablet 3  . predniSONE (DELTASONE) 20 MG tablet Take 2 tablets (40 mg total) by mouth daily with breakfast. (Patient not taking: Reported on 12/27/2018) 10 tablet 0  . traMADol (ULTRAM) 50 MG tablet Take 1 tablet (50 mg total) by mouth every 8 (eight) hours as needed. (Patient not taking: Reported on 01/10/2019) 30 tablet 0   No current facility-administered medications for this visit.    Facility-Administered Medications Ordered in Other Visits  Medication Dose Route Frequency Provider Last Rate Last Dose  . fludeoxyglucose F - 18 (FDG) injection 8.9 millicurie  8.9 millicurie Intravenous Once PRN Sherryl Barters, MD        REVIEW OF SYSTEMS:    A 10+ POINT REVIEW OF SYSTEMS WAS OBTAINED including neurology, dermatology, psychiatry, cardiac, respiratory, lymph, extremities, GI, GU, Musculoskeletal, constitutional, breasts, reproductive, HEENT.  All pertinent positives are noted in the HPI.  All others are negative.   PHYSICAL EXAMINATION: ECOG PERFORMANCE STATUS: 2 - Symptomatic, <50% confined to bed  . Vitals:  01/10/19 0846  BP: 122/67  Pulse: 92  Resp: 18  Temp: 98.2 F (36.8 C)  SpO2: 96%   Filed Weights   01/10/19 0846  Weight: 177 lb 8 oz (80.5 kg)   .Body mass index is 35.85 kg/m.  GENERAL:alert, in no acute distress and comfortable SKIN: no acute rashes, no significant lesions EYES: conjunctiva are pink and  non-injected, sclera anicteric OROPHARYNX: MMM, no exudates, no oropharyngeal erythema or ulceration NECK: supple, no JVD LYMPH:  no palpable lymphadenopathy in the cervical, axillary or inguinal regions LUNGS: clear to auscultation b/l with normal respiratory effort HEART: regular rate & rhythm ABDOMEN:  normoactive bowel sounds , non tender, not distended. No palpable hepatosplenomegaly.  Extremity: no pedal edema PSYCH: alert & oriented x 3 with fluent speech NEURO: no focal motor/sensory deficits   LABORATORY DATA:  I have reviewed the data as listed  . CBC Latest Ref Rng & Units 01/06/2019 12/27/2018 12/08/2018  WBC 4.0 - 10.5 K/uL 6.6 7.5 7.1  Hemoglobin 12.0 - 15.0 g/dL 13.3 13.1 13.1  Hematocrit 36.0 - 46.0 % 39.5 38.2 38.0  Platelets 150 - 400 K/uL 256 245 245.0   . CBC    Component Value Date/Time   WBC 6.6 01/06/2019 0757   RBC 4.29 01/06/2019 0757   HGB 13.3 01/06/2019 0757   HCT 39.5 01/06/2019 0757   PLT 256 01/06/2019 0757   MCV 92.1 01/06/2019 0757   MCH 31.0 01/06/2019 0757   MCHC 33.7 01/06/2019 0757   RDW 12.1 01/06/2019 0757   LYMPHSABS 1.8 01/06/2019 0757   MONOABS 1.1 (H) 01/06/2019 0757   EOSABS 0.2 01/06/2019 0757   BASOSABS 0.0 01/06/2019 0757    . CMP Latest Ref Rng & Units 12/27/2018 12/08/2018 12/08/2018  Glucose 70 - 99 mg/dL 135(H) - 279(H)  BUN 8 - 23 mg/dL 23 - 24(H)  Creatinine 0.44 - 1.00 mg/dL 0.64 - 0.63  Sodium 135 - 145 mmol/L 137 - 136  Potassium 3.5 - 5.1 mmol/L 4.1 - 3.7  Chloride 98 - 111 mmol/L 106 - 104  CO2 22 - 32 mmol/L 22 - 23  Calcium 8.9 - 10.3 mg/dL 10.7(H) - 10.3  Total Protein 6.5 - 8.1 g/dL 6.9 6.0(L) 6.4  Total Bilirubin 0.3 - 1.2 mg/dL 0.5 - 0.4  Alkaline Phos 38 - 126 U/L 126 - 93  AST 15 - 41 U/L 63(H) - 41(H)  ALT 0 - 44 U/L 73(H) - 46(H)   Component     Latest Ref Rng & Units 12/27/2018  IgG (Immunoglobin G), Serum     700 - 1,600 mg/dL 801  IgA     64 - 422 mg/dL 91  IgM (Immunoglobulin M), Srm      26 - 217 mg/dL 15 (L)  Total Protein ELP     6.0 - 8.5 g/dL 6.6  Albumin SerPl Elph-Mcnc     2.9 - 4.4 g/dL 3.7  Alpha 1     0.0 - 0.4 g/dL 0.2  Alpha2 Glob SerPl Elph-Mcnc     0.4 - 1.0 g/dL 0.8  B-Globulin SerPl Elph-Mcnc     0.7 - 1.3 g/dL 1.2  Gamma Glob SerPl Elph-Mcnc     0.4 - 1.8 g/dL 0.7  M Protein SerPl Elph-Mcnc     Not Observed g/dL 0.5 (H)  Globulin, Total     2.2 - 3.9 g/dL 2.9  Albumin/Glob SerPl     0.7 - 1.7 1.3  IFE 1      Comment  Please Note (HCV):      Comment  Kappa free light chain     3.3 - 19.4 mg/L 5.1  Lamda free light chains     5.7 - 26.3 mg/L 40.3 (H)  Kappa, lamda light chain ratio     0.26 - 1.65 0.13 (L)  Beta-2 Microglobulin     0.6 - 2.4 mg/L 1.7  LDH     98 - 192 U/L 162  Sed Rate     0 - 22 mm/hr 8       RADIOGRAPHIC STUDIES: I have personally reviewed the radiological images as listed and agreed with the findings in the report. Nm Bone Scan Whole Body  Result Date: 12/13/2018 CLINICAL DATA:  Lytic bone lesions of the right femur. Severe right hip and femur pain. EXAM: NUCLEAR MEDICINE WHOLE BODY BONE SCAN TECHNIQUE: Whole body anterior and posterior images were obtained approximately 3 hours after intravenous injection of radiopharmaceutical. RADIOPHARMACEUTICALS:  20.3 mCi Technetium-48mMDP IV COMPARISON:  CT of the right femur 12/13/2018. Right femur radiographs 12/05/2018. FINDINGS: Uptake in the proximal right femur corresponds to the lytic lesion. Additional uptake is noted anteriorly in the right second and third rib, more laterally in the fourth and fifth ribs, anteriorly the seventh rib. More lateral uptake is present in the left eighth rib. There is uptake in the proximal left humerus. Multiple foci are present in posterior ribs bilaterally. Extensive uptake is present within the calvarium. Uptake is present in the left side of L4. IMPRESSION: 1. Multifocal uptake throughout the skeleton, consistent with diffuse metastatic  disease. Primary tumor is not specified. 2. Uptake in the proximal right femur, consistent with lytic lesions. 3. Uptake in the ribs bilaterally as described. 4. Lesions in the proximal left humerus. 5. Diffuse uptake throughout the skull consistent with metastatic disease. 6. Right paramedian uptake at the manubrium. Electronically Signed   By: CSan MorelleM.D.   On: 12/13/2018 15:29   Ct Femur Right Wo Contrast  Result Date: 12/13/2018 CLINICAL DATA:  Right femoral lytic bone lesion seen on recent x-ray dated 12/05/2018. Pain all over for 8 weeks. Status post fall October 2018. EXAM: CT OF THE LOWER RIGHT EXTREMITY WITHOUT CONTRAST TECHNIQUE: Multidetector CT imaging of the right lower extremity was performed according to the standard protocol. COMPARISON:  None. FINDINGS: Bones/Joint/Cartilage No fracture or dislocation. Normal alignment. No joint effusion. Lytic bone lesion in the proximal femoral diaphysis posteriorly measuring 16 x 12 mm with a soft tissue component extending through the cortex. Second lytic cortically based bone lesion in the mid femoral diaphysis. Multiple other subtle lytic bone lesions within the cortex of the mid and distal femoral diaphysis. Lytic bone lesion in the left inferior pubic ramus. Ligaments Ligaments are suboptimally evaluated by CT. Muscles and Tendons Muscles are normal.  No muscle atrophy. Soft tissue No fluid collection or hematoma.  No soft tissue mass. IMPRESSION: 1. Numerous lytic lesions involving the right femur and a lytic lesion in the left inferior pubic ramus. Overall appearance is most concerning for multiple myeloma. Electronically Signed   By: HKathreen Devoid  On: 12/13/2018 15:01   Nm Pet Image Initial (pi) Whole Body  Result Date: 01/05/2019 CLINICAL DATA:  Initial treatment strategy for multiple myeloma. EXAM: NUCLEAR MEDICINE PET WHOLE BODY TECHNIQUE: 8.9 mCi F-18 FDG was injected intravenously. Full-ring PET imaging was performed from the  skull base to thigh after the radiotracer. CT data was obtained and used for attenuation correction and anatomic localization.  Fasting blood glucose: 162 mg/dl COMPARISON:  Bone scan of 12/13/2018 FINDINGS: Mediastinal blood pool activity: SUV max 2.9 HEAD/NECK: A lymph node measuring 1.2 cm in short axis in the left parapharyngeal space on image 47/4 has a maximum SUV 11.8. Incidental CT findings: none CHEST: No significant abnormal hypermetabolic activity in this region. Incidental CT findings: Mild mosaic attenuation particularly favoring the lower lobes. Old granulomatous disease. Aortic atherosclerosis. ABDOMEN/PELVIS: Accentuated heterogeneity of activity in the liver. One area of heterogeneity posteriorly in the right hepatic lobe has a maximum SUV of 5.1 as compared to the general background liver activity with maximum SUV of 4.1. A hypodense lesion posteriorly in the dome of the right hepatic lobe on image 105/4 is not discernibly hypermetabolic. Similarly there is some mild heterogeneity of the splenic activity. Physiologic activity throughout the bowel. Incidental CT findings: Old granulomatous disease involving the spleen. 4 mm nonobstructive right mid kidney calculus in the collecting system. Aortoiliac atherosclerotic vascular disease. Scattered sigmoid colon diverticula. SKELETON: Innumerable scattered small lytic lesions are present throughout the skeleton. Some of these are more confluent and measurably hypermetabolic, for example the larger lytic lesion of the left proximal humerus which has a maximum SUV of 8.1. There is a likely pathologic fracture of the right third rib with maximum SUV 3.7. Likely pathologic fracture of the right fifth rib with maximum SUV 3.5. Likely pathologic fracture of the left ninth rib posterolaterally. A 2.8 by 1.9 cm lytic lesion of the left side of the T9 vertebral body has some cortical destruction on image 107/4, and a maximum SUV of 5.1. An index lesion posteriorly  in the right proximal femoral shaft measures 2.0 cm in diameter on image 216/4 with maximum SUV 4.0. Some of the lytic lesions, such as the 1.8 cm lytic lesion of the left inferior pubis and inferior pubic ramus shown on image 200/4, are not appreciably hypermetabolic. Incidental CT findings: Postoperative findings in the right ankle with K-wires along the malleoli. EXTREMITIES: No significant abnormal hypermetabolic activity in this region. Incidental CT findings: none IMPRESSION: 1. Innumerable lytic lesions in the skeleton compatible with myeloma. Most of the larger lesions are hypermetabolic, for example including a left proximal humeral shaft lesion with maximum SUV of 8.1 and a 2.8 cm lesion in the left T9 vertebral body with maximum SUV 5.1. Most of the smaller lytic lesions, and some of the larger lesions, do not demonstrate accentuated metabolic activity. 2. 1.2 cm in short axis lymph node in the left parapharyngeal space is hypermetabolic with maximum SUV 11.8. I do not see a separate mass in the head and neck to give rise to this hypermetabolic lymph node. 3. Mosaic attenuation in the lower lobes, nonspecific possibly from air trapping. 4.  Aortic Atherosclerosis (ICD10-I70.0). 5. Heterogeneous activity in the liver, making it hard to exclude small liver lesions. Consider hepatic protocol MRI with and without contrast for definitive assessment. Nonobstructive right nephrolithiasis. Old granulomatous disease. Electronically Signed   By: Van Clines M.D.   On: 01/05/2019 09:13   Ct Biopsy  Result Date: 01/06/2019 CLINICAL DATA:  Multiple lytic bone lesions with clinical suspicion of multiple myeloma. Bone marrow biopsy required for further workup. EXAM: CT GUIDED BONE MARROW ASPIRATION AND BIOPSY ANESTHESIA/SEDATION: Versed 2.0 mg IV, Fentanyl 100 mcg IV Total Moderate Sedation Time:   11 minutes. The patient's level of consciousness and physiologic status were continuously monitored during the  procedure by Radiology nursing. PROCEDURE: The procedure risks, benefits, and alternatives were explained to the  patient. Questions regarding the procedure were encouraged and answered. The patient understands and consents to the procedure. A time out was performed prior to initiating the procedure. The right gluteal region was prepped with chlorhexidine. Sterile gown and sterile gloves were used for the procedure. Local anesthesia was provided with 1% Lidocaine. Under CT guidance, an 11 gauge On Control bone cutting needle was advanced from a posterior approach into the right iliac bone. Needle positioning was confirmed with CT. Initial non heparinized and heparinized aspirate samples were obtained of bone marrow. Core biopsy was performed via the On Control drill needle. COMPLICATIONS: None FINDINGS: Inspection of initial aspirate did reveal visible particles. Intact core biopsy sample was obtained. IMPRESSION: CT guided bone marrow biopsy of right posterior iliac bone with both aspirate and core samples obtained. Electronically Signed   By: Aletta Edouard M.D.   On: 01/06/2019 14:45   Ct Bone Marrow Biopsy & Aspiration  Result Date: 01/06/2019 CLINICAL DATA:  Multiple lytic bone lesions with clinical suspicion of multiple myeloma. Bone marrow biopsy required for further workup. EXAM: CT GUIDED BONE MARROW ASPIRATION AND BIOPSY ANESTHESIA/SEDATION: Versed 2.0 mg IV, Fentanyl 100 mcg IV Total Moderate Sedation Time:   11 minutes. The patient's level of consciousness and physiologic status were continuously monitored during the procedure by Radiology nursing. PROCEDURE: The procedure risks, benefits, and alternatives were explained to the patient. Questions regarding the procedure were encouraged and answered. The patient understands and consents to the procedure. A time out was performed prior to initiating the procedure. The right gluteal region was prepped with chlorhexidine. Sterile gown and sterile gloves  were used for the procedure. Local anesthesia was provided with 1% Lidocaine. Under CT guidance, an 11 gauge On Control bone cutting needle was advanced from a posterior approach into the right iliac bone. Needle positioning was confirmed with CT. Initial non heparinized and heparinized aspirate samples were obtained of bone marrow. Core biopsy was performed via the On Control drill needle. COMPLICATIONS: None FINDINGS: Inspection of initial aspirate did reveal visible particles. Intact core biopsy sample was obtained. IMPRESSION: CT guided bone marrow biopsy of right posterior iliac bone with both aspirate and core samples obtained. Electronically Signed   By: Aletta Edouard M.D.   On: 01/06/2019 14:45    ASSESSMENT & PLAN:   78 y.o. female with  1. Lytic bone lesions Concerning for multiple myeloma vs metastatic malignancy with occult primary  Labs upon initial presentation from 12/08/18, blood counts are normal including WBC at 7.1k, HGB at 13.1, and PLT at 245k. Calcium normal at 10.3. Creatinine normal at 0.63. M spike at 0.5g. 12/13/18 Bone Scan revealed Multifocal uptake throughout the skeleton, consistent with diffuse metastatic disease. Primary tumor is not specified. 2. Uptake in the proximal right femur, consistent with lytic lesions. 3. Uptake in the ribs bilaterally as described. 4. Lesions in the proximal left humerus. 5. Diffuse uptake throughout the skull consistent with metastatic disease. 6. Right paramedian uptake at the manubrium.  12/13/18 CT Right Femur revealed Numerous lytic lesions involving the right femur and a lytic lesion in the left inferior pubic ramus. Overall appearance is most concerning for multiple myeloma  2. Heterogeneous liver activity, as seen on 01/05/19 PET/CT  PLAN -Discussed pt labwork from 12/27/18; blood counts normal, Calcium at 10.7, normal Creatinine, AST at 63, ALT at 73. 24hour UPEP observed an M spike at 16m, and showed 1970mtotal protein/day.  MMP revealed M Protein at 0.5g with IgG Lambda specificity. Kappa:Lambda light chain  ratio at 0.13, with Lambda at 40.3. Normal Beta2, LDH and Sed Rate. -Discussed the 01/05/19 PET/CT which revealed Innumerable lytic lesions in the skeleton compatible with myeloma. Most of the larger lesions are hypermetabolic, for example including a left proximal humeral shaft lesion with maximum SUV of 8.1 and a 2.8 cm lesion in the left T9 vertebral body with maximum SUV 5.1. Most of the smaller lytic lesions, and some of the larger lesions, do not demonstrate accentuated metabolic activity. 2. 1.2 cm in short axis lymph node in the left parapharyngeal space is hypermetabolic with maximum SUV 11.8. I do not see a separate mass in the head and neck to give rise to this hypermetabolic lymph node. 3. Mosaic attenuation in the lower lobes, nonspecific possibly from air trapping. 4.  Aortic Atherosclerosis 5. Heterogeneous activity in the liver, making it hard to exclude small liver lesions. Consider hepatic protocol MRI with and without contrast for definitive assessment. Nonobstructive right nephrolithiasis. Old granulomatous disease -Discussed that the BM Bx from 01/06/19 has not been finalized yet, but a preliminary reading from the pathologist suggests 30-40% plasma cell involvement, consistent with Multiple Myeloma, which does coincide with her presentation -Discussed that there is less abnormal protein and light chains than I would expect from 30% plasma cells, which suggests hypo-secretory or non-secretory neoplastic plasma cells. Will have an impact in assessing response. -In view of the absence of expected M Protein and light chain burden for 30% plasma cell involvement, discussed that we could rule out the unlikely possibility of a second neoplastic process with a biopsy of one of the lytic lesions. Pt decided not to proceed with this at this time.  -RISS Stage I so far, though genetics are pending -Discussed the  recommended treatment of Zometa, Dexamethasone, Velcade, and possibly Revlimid after initial tolerance is displayed. Discussed possible side effects including neuropathy, shingles, and blood clots. -Acyclovir and 84m aspirin to mitigate risk of Shingles and blood clots respectively -Will set pt up for chemotherapy counseling and provided supplemental information -Will watch lymph node in left parapharyngeal space seen on PET/CT -Extra-medullary hematopoiesis vs metabolic liver disease vs hepatic malignancy ? -Will order MRI Liver -[facility] to [facility] risk of pathologic fractures -Avoid lifting, pulling, pushing with left upper extremity  -Avoid high impact activities and stairs to preserve right hip as well -May be a role for local radiation therapy for pain control at T9 and left shoulder -Continue 558mOxycodone q6h prn, Zofran for opiate-related nausea -Miralax once a day, and Senna S two tablets BID, back off as constipation resolves -Will see the pt back in 1-2 weeks   -[facility]. -chemo-counseling for VRd and Zometa in 3 days -Schedule to start Zometa q4weeks in 7 days -Schedule to start Vrd in 7 days -RTC with Dr KaIrene Limbo1D1 of treatment with labs -MRI liver in 1 week   All of the patients questions were answered with apparent satisfaction. The patient knows to call the clinic with any problems, questions or concerns.  The total time spent in the appt was 50 minutes and more than 50% was on counseling and direct patient cares.    GaSullivan LoneD MSHoliday LakesAHIVMS SCSurgery Center Of Br[facility] LLCTBaptist [facility].ematology/Oncology Physician CoV[facility](Office):       [phone] cell):  [phone]):  [phone]  01/10/2019 10:19 AM  I, Baldwin [place], am acting as a scribe for Dr. Sullivan Lone.   .I have reviewed  the above documentation for accuracy and completeness, and I agree with the above. Brunetta Genera MD

## 2019-01-10 ENCOUNTER — Telehealth: Payer: Self-pay

## 2019-01-10 ENCOUNTER — Inpatient Hospital Stay: Payer: Medicare Other | Admitting: Hematology

## 2019-01-10 VITALS — BP 122/67 | HR 92 | Temp 98.2°F | Resp 18 | Ht 59.0 in | Wt 177.5 lb

## 2019-01-10 DIAGNOSIS — C9000 Multiple myeloma not having achieved remission: Secondary | ICD-10-CM

## 2019-01-10 DIAGNOSIS — M25512 Pain in left shoulder: Secondary | ICD-10-CM | POA: Diagnosis not present

## 2019-01-10 DIAGNOSIS — M549 Dorsalgia, unspecified: Secondary | ICD-10-CM

## 2019-01-10 DIAGNOSIS — C7951 Secondary malignant neoplasm of bone: Secondary | ICD-10-CM

## 2019-01-10 DIAGNOSIS — C787 Secondary malignant neoplasm of liver and intrahepatic bile duct: Secondary | ICD-10-CM

## 2019-01-10 DIAGNOSIS — C801 Malignant (primary) neoplasm, unspecified: Secondary | ICD-10-CM | POA: Diagnosis not present

## 2019-01-10 DIAGNOSIS — E119 Type 2 diabetes mellitus without complications: Secondary | ICD-10-CM

## 2019-01-10 DIAGNOSIS — M6281 Muscle weakness (generalized): Secondary | ICD-10-CM

## 2019-01-10 MED ORDER — OXYCODONE HCL 5 MG PO TABS: 5.0000 mg | ORAL_TABLET | Freq: Four times a day (QID) | ORAL | 0 refills | Status: DC | PRN

## 2019-01-10 MED ORDER — ONDANSETRON HCL 8 MG PO TABS: 8.0000 mg | ORAL_TABLET | Freq: Three times a day (TID) | ORAL | 3 refills | Status: DC | PRN

## 2019-01-10 MED ORDER — SENNOSIDES-DOCUSATE SODIUM 8.6-50 MG PO TABS: 2.0000 | ORAL_TABLET | Freq: Every day | ORAL | 1 refills | Status: DC

## 2019-01-10 NOTE — Patient Instructions (Signed)
Thank you for choosing [facility] to provide your oncology and hematology care.  To afford each patient quality time with our providers, please arrive 30 minutes before your scheduled appointment time.  If you arrive late for your appointment, you may be asked to reschedule.  We strive to give you quality time with our providers, and arriving late affects you and other patients whose appointments are after yours.    If you are a no show for multiple scheduled visits, you may be dismissed from the clinic at the providers discretion.     Again, thank you for choosing [facility], our hope is that these requests will decrease the amount of time that you wait before being seen by our physicians.  ______________________________________________________________________   Should you have questions after your visit to the [facility], please contact our office at [phone] between the hours of 8:30 and 4:30 p.m.    Voicemails left after 4:30p.m will not be returned until the following business day.     For prescription refill requests, please have your pharmacy contact us directly.  Please also try to allow 48 hours for prescription requests.     Please contact the scheduling department for questions regarding scheduling.  For scheduling of procedures such as PET scans, CT scans, MRI, Ultrasound, etc please contact central scheduling at [phone].     Resources For Cancer Patients and Caregivers:    Oncolink.org:  A wonderful resource for patients and healthcare providers for information regarding your disease, ways to tract your treatment, what to expect, etc.      American Cancer Society:  800-227-2345  Can help patients locate various types of support and financial assistance   Cancer Care: 1-800-813-HOPE (4673) Provides financial assistance, online support groups, medication/co-pay assistance.     [place] DSS:  336-641-3447 Where to apply  for food stamps, Medicaid, and utility assistance   Medicare Rights Center: 800-333-4114 Helps people with Medicare understand their rights and benefits, navigate the Medicare system, and secure the quality healthcare they deserve   SCAT: 336-333-6589 [place] [employer]'s shared-ride transportation service for eligible riders who have a disability that prevents them from riding the fixed route bus.     For additional information on assistance programs please contact our social worker:   Faith Orr:  [phone]  

## 2019-01-10 NOTE — Telephone Encounter (Signed)
Spoke with [place] concerning follow up on D1. He verbally said schedule on day 4 not to double book or delay treatment. Per 1/28 los

## 2019-01-10 NOTE — Telephone Encounter (Signed)
Printed avs and calender of upcoming appointment. per 1/28 los

## 2019-01-11 ENCOUNTER — Telehealth: Payer: Self-pay | Admitting: Hematology

## 2019-01-11 NOTE — Telephone Encounter (Signed)
CALLED PT LEFT VM IN REF TO APPT WITH DR EMORY AT [facility] 01/30/19@2 :40. MEDICAL RECORDS FAXED AND ORDERED FILMS ON CD FOR PT TO PICK UP AND TAKE TO APPT.

## 2019-01-11 NOTE — Telephone Encounter (Signed)
FAXED RECORDS TO [facility] RADIATION ONCOLOGY [phone]  [facility]

## 2019-01-13 ENCOUNTER — Inpatient Hospital Stay: Payer: Medicare Other

## 2019-01-16 ENCOUNTER — Encounter (HOSPITAL_COMMUNITY): Payer: Self-pay | Admitting: Hematology

## 2019-01-16 ENCOUNTER — Other Ambulatory Visit: Payer: Self-pay | Admitting: Hematology

## 2019-01-16 DIAGNOSIS — C9000 Multiple myeloma not having achieved remission: Secondary | ICD-10-CM

## 2019-01-16 DIAGNOSIS — Z7189 Other specified counseling: Secondary | ICD-10-CM

## 2019-01-16 MED ORDER — ACYCLOVIR 400 MG PO TABS: 400.0000 mg | ORAL_TABLET | Freq: Two times a day (BID) | ORAL | 3 refills | Status: DC

## 2019-01-16 MED ORDER — PROCHLORPERAZINE MALEATE 10 MG PO TABS: 10.0000 mg | ORAL_TABLET | Freq: Four times a day (QID) | ORAL | 1 refills | Status: AC | PRN

## 2019-01-16 MED ORDER — DEXAMETHASONE 4 MG PO TABS: 20.0000 mg | ORAL_TABLET | ORAL | 5 refills | Status: DC

## 2019-01-16 MED ORDER — ONDANSETRON HCL 8 MG PO TABS: 8.0000 mg | ORAL_TABLET | Freq: Two times a day (BID) | ORAL | 1 refills | Status: DC | PRN

## 2019-01-16 NOTE — Progress Notes (Signed)
START ON PATHWAY REGIMEN - Multiple Myeloma and Other Plasma Cell Dyscrasias     A cycle is every 21 days:     Bortezomib      Lenalidomide      Dexamethasone   **Always confirm dose/schedule in your pharmacy ordering system**  Patient Characteristics: Newly Diagnosed, Transplant Eligible, Standard Risk R-ISS Staging: Unknown Disease Classification: Newly Diagnosed Is Patient Eligible for Transplant<= Transplant Eligible Risk Status: Standard Risk Intent of Therapy: Non-Curative / Palliative Intent, Discussed with Patient

## 2019-01-17 ENCOUNTER — Ambulatory Visit (HOSPITAL_COMMUNITY)
Admission: RE | Admit: 2019-01-17 | Discharge: 2019-01-17 | Disposition: A | Discharge status: Home / Self Care | Payer: Medicare Other | Source: Ambulatory Visit | Attending: Hematology | Admitting: Hematology

## 2019-01-17 ENCOUNTER — Other Ambulatory Visit: Payer: Medicare Other

## 2019-01-17 ENCOUNTER — Other Ambulatory Visit: Payer: Self-pay | Admitting: *Deleted

## 2019-01-17 ENCOUNTER — Encounter (HOSPITAL_COMMUNITY): Payer: Self-pay | Admitting: Hematology

## 2019-01-17 DIAGNOSIS — C787 Secondary malignant neoplasm of liver and intrahepatic bile duct: Secondary | ICD-10-CM | POA: Insufficient documentation

## 2019-01-17 DIAGNOSIS — C9000 Multiple myeloma not having achieved remission: Secondary | ICD-10-CM

## 2019-01-17 MED ORDER — GADOBUTROL 1 MMOL/ML IV SOLN
8.0000 mL | Freq: Once | INTRAVENOUS | Status: AC | PRN
  Administered 2019-01-17: 8 mL via INTRAVENOUS

## 2019-01-18 ENCOUNTER — Inpatient Hospital Stay: Payer: Medicare Other

## 2019-01-18 ENCOUNTER — Inpatient Hospital Stay: Payer: Medicare Other | Attending: Hematology

## 2019-01-18 VITALS — BP 120/54 | HR 79 | Temp 97.8°F | Resp 17

## 2019-01-18 DIAGNOSIS — Z7189 Other specified counseling: Secondary | ICD-10-CM

## 2019-01-18 DIAGNOSIS — Z5112 Encounter for antineoplastic immunotherapy: Secondary | ICD-10-CM | POA: Diagnosis not present

## 2019-01-18 DIAGNOSIS — C9000 Multiple myeloma not having achieved remission: Secondary | ICD-10-CM | POA: Insufficient documentation

## 2019-01-18 LAB — CBC WITH DIFFERENTIAL (CANCER CENTER ONLY)
Abs Immature Granulocytes: 0.03 10*3/uL (ref 0.00–0.07)
Basophils Absolute: 0 10*3/uL (ref 0.0–0.1)
Basophils Relative: 0 %
EOS ABS: 0.2 10*3/uL (ref 0.0–0.5)
Eosinophils Relative: 3 %
HCT: 37.9 % (ref 36.0–46.0)
Hemoglobin: 13 g/dL (ref 12.0–15.0)
Immature Granulocytes: 1 %
Lymphocytes Relative: 31 %
Lymphs Abs: 1.7 10*3/uL (ref 0.7–4.0)
MCH: 31.3 pg (ref 26.0–34.0)
MCHC: 34.3 g/dL (ref 30.0–36.0)
MCV: 91.3 fL (ref 80.0–100.0)
Monocytes Absolute: 0.9 10*3/uL (ref 0.1–1.0)
Monocytes Relative: 16 %
Neutro Abs: 2.7 10*3/uL (ref 1.7–7.7)
Neutrophils Relative %: 49 %
Platelet Count: 237 10*3/uL (ref 150–400)
RBC: 4.15 MIL/uL (ref 3.87–5.11)
RDW: 12.2 % (ref 11.5–15.5)
WBC: 5.6 10*3/uL (ref 4.0–10.5)
nRBC: 0 % (ref 0.0–0.2)

## 2019-01-18 LAB — CMP (CANCER CENTER ONLY)
ALT: 50 U/L — ABNORMAL HIGH (ref 0–44)
AST: 53 U/L — ABNORMAL HIGH (ref 15–41)
Albumin: 3.6 g/dL (ref 3.5–5.0)
Alkaline Phosphatase: 113 U/L (ref 38–126)
Anion gap: 13 (ref 5–15)
BUN: 17 mg/dL (ref 8–23)
CO2: 21 mmol/L — ABNORMAL LOW (ref 22–32)
Calcium: 10.6 mg/dL — ABNORMAL HIGH (ref 8.9–10.3)
Chloride: 105 mmol/L (ref 98–111)
Creatinine: 0.77 mg/dL (ref 0.44–1.00)
GFR, Est AFR Am: >60 mL/min (ref >60)
Glucose, Bld: 305 mg/dL — ABNORMAL HIGH (ref 70–99)
Potassium: 3.6 mmol/L (ref 3.5–5.1)
Sodium: 139 mmol/L (ref 135–145)
TOTAL PROTEIN: 6.7 g/dL (ref 6.5–8.1)
Total Bilirubin: 0.5 mg/dL (ref 0.3–1.2)

## 2019-01-18 MED ORDER — BORTEZOMIB CHEMO SQ INJECTION 3.5 MG (2.5MG/ML)
1.3000 mg/m2 | Freq: Once | INTRAMUSCULAR | Status: AC
  Administered 2019-01-18: 2.5 mg via SUBCUTANEOUS
  Filled 2019-01-18: qty 1

## 2019-01-18 MED ORDER — DEXAMETHASONE 4 MG PO TABS
10.0000 mg | ORAL_TABLET | Freq: Once | ORAL | Status: AC
  Administered 2019-01-18: 10 mg via ORAL

## 2019-01-18 MED ORDER — PROCHLORPERAZINE MALEATE 10 MG PO TABS
ORAL_TABLET | ORAL | Status: AC
  Filled 2019-01-18: qty 1

## 2019-01-18 MED ORDER — PROCHLORPERAZINE MALEATE 10 MG PO TABS
10.0000 mg | ORAL_TABLET | Freq: Once | ORAL | Status: AC
  Administered 2019-01-18: 10 mg via ORAL

## 2019-01-18 MED ORDER — ZOLEDRONIC ACID 4 MG/100ML IV SOLN
4.0000 mg | Freq: Once | INTRAVENOUS | Status: AC
  Administered 2019-01-18: 4 mg via INTRAVENOUS
  Filled 2019-01-18: qty 100

## 2019-01-18 MED ORDER — DEXAMETHASONE 4 MG PO TABS
ORAL_TABLET | ORAL | Status: AC
  Filled 2019-01-18: qty 3

## 2019-01-18 MED ORDER — SODIUM CHLORIDE 0.9 % IV SOLN
Freq: Once | INTRAVENOUS | Status: AC
  Administered 2019-01-18: 09:00:00 via INTRAVENOUS
  Filled 2019-01-18: qty 250

## 2019-01-18 NOTE — Patient Instructions (Signed)
[facility] Discharge Instructions for Patients Receiving Chemotherapy  Today you received the following chemotherapy agents Velcade  To help prevent nausea and vomiting after your treatment, we encourage you to take your nausea medication as prescribed.   If you develop nausea and vomiting that is not controlled by your nausea medication, call the clinic.   BELOW ARE SYMPTOMS THAT SHOULD BE REPORTED IMMEDIATELY:  *FEVER GREATER THAN 100.5 F  *CHILLS WITH OR WITHOUT FEVER  NAUSEA AND VOMITING THAT IS NOT CONTROLLED WITH YOUR NAUSEA MEDICATION  *UNUSUAL SHORTNESS OF BREATH  *UNUSUAL BRUISING OR BLEEDING  TENDERNESS IN MOUTH AND THROAT WITH OR WITHOUT PRESENCE OF ULCERS  *URINARY PROBLEMS  *BOWEL PROBLEMS  UNUSUAL RASH Items with * indicate a potential emergency and should be followed up as soon as possible.  Feel free to call the clinic should you have any questions or concerns. The clinic phone number is [phone].  Please show the [place] at check-in to the Emergency Department and triage nurse.  Bortezomib injection(Velcade) What is this medicine? BORTEZOMIB (bor TEZ oh mib) is a medicine that targets proteins in cancer cells and stops the cancer cells from growing. It is used to treat multiple myeloma and mantle-cell lymphoma. This medicine may be used for other purposes; ask your health care provider or pharmacist if you have questions. COMMON BRAND NAME(S): Velcade What should I tell my health care provider before I take this medicine? They need to know if you have any of these conditions: -diabetes -heart disease -irregular heartbeat -liver disease -on hemodialysis -low blood counts, like low white blood cells, platelets, or hemoglobin -peripheral neuropathy -taking medicine for blood pressure -an unusual or allergic reaction to bortezomib, mannitol, boron, other medicines, foods, dyes, or preservatives -pregnant or trying to get  pregnant -breast-feeding How should I use this medicine? This medicine is for injection into a vein or for injection under the skin. It is given by a health care professional in a hospital or clinic setting. Talk to your pediatrician regarding the use of this medicine in children. Special care may be needed. Overdosage: If you think you have taken too much of this medicine contact a poison control center or emergency room at once. NOTE: This medicine is only for you. Do not share this medicine with others. What if I miss a dose? It is important not to miss your dose. Call your doctor or health care professional if you are unable to keep an appointment. What may interact with this medicine? This medicine may interact with the following medications: -ketoconazole -rifampin -ritonavir -St. John's Wort This list may not describe all possible interactions. Give your health care provider a list of all the medicines, herbs, non-prescription drugs, or dietary supplements you use. Also tell them if you smoke, drink alcohol, or use illegal drugs. Some items may interact with your medicine. What should I watch for while using this medicine? You may get drowsy or dizzy. Do not drive, use machinery, or do anything that needs mental alertness until you know how this medicine affects you. Do not stand or sit up quickly, especially if you are an older patient. This reduces the risk of dizzy or fainting spells. In some cases, you may be given additional medicines to help with side effects. Follow all directions for their use. Call your doctor or health care professional for advice if you get a fever, chills or sore throat, or other symptoms of a cold or flu. Do not treat  yourself. This drug decreases your body's ability to fight infections. Try to avoid being around people who are sick. This medicine may increase your risk to bruise or bleed. Call your doctor or health care professional if you notice any unusual  bleeding. You may need blood work done while you are taking this medicine. In some patients, this medicine may cause a serious brain infection that may cause death. If you have any problems seeing, thinking, speaking, walking, or standing, tell your doctor right away. If you cannot reach your doctor, urgently seek other source of medical care. Check with your doctor or health care professional if you get an attack of severe diarrhea, nausea and vomiting, or if you sweat a lot. The loss of too much body fluid can make it dangerous for you to take this medicine. Do not become pregnant while taking this medicine or for at least 7 months after stopping it. Women should inform their doctor if they wish to become pregnant or think they might be pregnant. Men should not father a child while taking this medicine and for at least 4 months after stopping it. There is a potential for serious side effects to an unborn child. Talk to your health care professional or pharmacist for more information. Do not breast-feed an infant while taking this medicine or for 2 months after stopping it. This medicine may interfere with the ability to have a child. You should talk with your doctor or health care professional if you are concerned about your fertility. What side effects may I notice from receiving this medicine? Side effects that you should report to your doctor or health care professional as soon as possible: -allergic reactions like skin rash, itching or hives, swelling of the face, lips, or tongue -breathing problems -changes in hearing -changes in vision -fast, irregular heartbeat -feeling faint or lightheaded, falls -pain, tingling, numbness in the hands or feet -right upper belly pain -seizures -swelling of the ankles, feet, hands -unusual bleeding or bruising -unusually weak or tired -vomiting -yellowing of the eyes or skin Side effects that usually do not require medical attention (report to your  doctor or health care professional if they continue or are bothersome): -changes in emotions or moods -constipation -diarrhea -loss of appetite -headache -irritation at site where injected -nausea This list may not describe all possible side effects. Call your doctor for medical advice about side effects. You may report side effects to FDA at 1-800-FDA-1088. Where should I keep my medicine? This drug is given in a hospital or clinic and will not be stored at home. NOTE: This sheet is a summary. It may not cover all possible information. If you have questions about this medicine, talk to your doctor, pharmacist, or health care provider.  2019 Elsevier/Gold Standard (2018-04-11 16:29:31)   Zoledronic Acid injection (Hypercalcemia, Oncology) (Zometa) What is this medicine? ZOLEDRONIC ACID (ZOE le dron ik AS id) lowers the amount of calcium loss from bone. It is used to treat too much calcium in your blood from cancer. It is also used to prevent complications of cancer that has spread to the bone. This medicine may be used for other purposes; ask your health care provider or pharmacist if you have questions. COMMON BRAND NAME(S): Zometa What should I tell my health care provider before I take this medicine? They need to know if you have any of these conditions: -aspirin-sensitive asthma -cancer, especially if you are receiving medicines used to treat cancer -dental disease or wear dentures -infection -  kidney disease -receiving corticosteroids like dexamethasone or prednisone -an unusual or allergic reaction to zoledronic acid, other medicines, foods, dyes, or preservatives -pregnant or trying to get pregnant -breast-feeding How should I use this medicine? This medicine is for infusion into a vein. It is given by a health care professional in a hospital or clinic setting. Talk to your pediatrician regarding the use of this medicine in children. Special care may be needed. Overdosage: If  you think you have taken too much of this medicine contact a poison control center or emergency room at once. NOTE: This medicine is only for you. Do not share this medicine with others. What if I miss a dose? It is important not to miss your dose. Call your doctor or health care professional if you are unable to keep an appointment. What may interact with this medicine? -certain antibiotics given by injection -NSAIDs, medicines for pain and inflammation, like ibuprofen or naproxen -some diuretics like bumetanide, furosemide -teriparatide -thalidomide This list may not describe all possible interactions. Give your health care provider a list of all the medicines, herbs, non-prescription drugs, or dietary supplements you use. Also tell them if you smoke, drink alcohol, or use illegal drugs. Some items may interact with your medicine. What should I watch for while using this medicine? Visit your doctor or health care professional for regular checkups. It may be some time before you see the benefit from this medicine. Do not stop taking your medicine unless your doctor tells you to. Your doctor may order blood tests or other tests to see how you are doing. Women should inform their doctor if they wish to become pregnant or think they might be pregnant. There is a potential for serious side effects to an unborn child. Talk to your health care professional or pharmacist for more information. You should make sure that you get enough calcium and vitamin D while you are taking this medicine. Discuss the foods you eat and the vitamins you take with your health care professional. Some people who take this medicine have severe bone, joint, and/or muscle pain. This medicine may also increase your risk for jaw problems or a broken thigh bone. Tell your doctor right away if you have severe pain in your jaw, bones, joints, or muscles. Tell your doctor if you have any pain that does not go away or that gets  worse. Tell your dentist and dental surgeon that you are taking this medicine. You should not have major dental surgery while on this medicine. See your dentist to have a dental exam and fix any dental problems before starting this medicine. Take good care of your teeth while on this medicine. Make sure you see your dentist for regular follow-up appointments. What side effects may I notice from receiving this medicine? Side effects that you should report to your doctor or health care professional as soon as possible: -allergic reactions like skin rash, itching or hives, swelling of the face, lips, or tongue -anxiety, confusion, or depression -breathing problems -changes in vision -eye pain -feeling faint or lightheaded, falls -jaw pain, especially after dental work -mouth sores -muscle cramps, stiffness, or weakness -redness, blistering, peeling or loosening of the skin, including inside the mouth -trouble passing urine or change in the amount of urine Side effects that usually do not require medical attention (report to your doctor or health care professional if they continue or are bothersome): -bone, joint, or muscle pain -constipation -diarrhea -fever -hair loss -irritation at site where injected -  loss of appetite -nausea, vomiting -stomach upset -trouble sleeping -trouble swallowing -weak or tired This list may not describe all possible side effects. Call your doctor for medical advice about side effects. You may report side effects to FDA at 1-800-FDA-1088. Where should I keep my medicine? This drug is given in a hospital or clinic and will not be stored at home. NOTE: This sheet is a summary. It may not cover all possible information. If you have questions about this medicine, talk to your doctor, pharmacist, or health care provider.  2019 Elsevier/Gold Standard (2014-04-28 14:19:39)

## 2019-01-19 ENCOUNTER — Encounter: Payer: Self-pay | Admitting: Hematology

## 2019-01-19 ENCOUNTER — Inpatient Hospital Stay: Payer: Medicare Other | Admitting: Hematology

## 2019-01-19 ENCOUNTER — Telehealth: Payer: Self-pay | Admitting: *Deleted

## 2019-01-19 ENCOUNTER — Inpatient Hospital Stay: Payer: Medicare Other

## 2019-01-19 NOTE — Telephone Encounter (Signed)
Per Dr. Irene Limbo: Contact patient and advise that appointments scheduled for today are not needed per treatment plan. Patient is to be rescheduled for labs and MD appt per MD. Advised patient to expect call from scheduling. Asked  she felt since 1st chemo was yesterday. Patient reports she feels a little tired, but otherwise well. Reviewed patient medications Compazine, Zofran, and Decadron with her. Patient verbalized understanding of all medication purpose and how to take.

## 2019-01-20 ENCOUNTER — Telehealth: Payer: Self-pay | Admitting: *Deleted

## 2019-01-20 ENCOUNTER — Other Ambulatory Visit: Payer: Self-pay | Admitting: Hematology

## 2019-01-20 MED ORDER — OXYCODONE HCL 5 MG PO TABS: 5.0000 mg | ORAL_TABLET | Freq: Four times a day (QID) | ORAL | 0 refills | Status: DC | PRN

## 2019-01-20 NOTE — Telephone Encounter (Signed)
MyChart message from 2/6: "....my face was more red than usual. I also have a little red around the site they injected my chemo with yesterday. I'm not itching and no "pimply" breakout. I do have a history with rosacea." Per Dr. Irene Limbo direction, attempted to contact patient with following information and left voice mail: Could be rosacea flare from steroids and meds. If persistent/worsening, she can come in this afternoon to be seen in symptoms management. Please contact office (left number) to let us know how you're doing and if you want to come in to be seen today.

## 2019-01-20 NOTE — Telephone Encounter (Signed)
Contacted patient to let her know oxycodone refilled as requested. Patient stated skin concern reported in previus call much better -was barely pink today.

## 2019-01-21 LAB — MULTIPLE MYELOMA PANEL, SERUM
Albumin SerPl Elph-Mcnc: 3.4 g/dL (ref 2.9–4.4)
Albumin/Glob SerPl: 1.3 (ref 0.7–1.7)
Alpha 1: 0.2 g/dL (ref 0.0–0.4)
Alpha2 Glob SerPl Elph-Mcnc: 0.9 g/dL (ref 0.4–1.0)
B-Globulin SerPl Elph-Mcnc: 0.9 g/dL (ref 0.7–1.3)
Gamma Glob SerPl Elph-Mcnc: 0.8 g/dL (ref 0.4–1.8)
Globulin, Total: 2.8 g/dL (ref 2.2–3.9)
IGA: 87 mg/dL (ref 64–422)
IGM (IMMUNOGLOBULIN M), SRM: 11 mg/dL — AB (ref 26–217)
IgG (Immunoglobin G), Serum: 838 mg/dL (ref 700–1600)
M Protein SerPl Elph-Mcnc: 0.5 g/dL — ABNORMAL HIGH
TOTAL PROTEIN ELP: 6.2 g/dL (ref 6.0–8.5)

## 2019-01-23 ENCOUNTER — Telehealth: Payer: Self-pay | Admitting: Hematology

## 2019-01-23 NOTE — Telephone Encounter (Signed)
Called pt per 2/6 sch message - unable to reach patient . Left message with appt date and time for 2/12 appt./

## 2019-01-24 ENCOUNTER — Other Ambulatory Visit: Payer: Self-pay | Admitting: *Deleted

## 2019-01-24 DIAGNOSIS — C9000 Multiple myeloma not having achieved remission: Secondary | ICD-10-CM

## 2019-01-24 NOTE — Progress Notes (Signed)
HEMATOLOGY/ONCOLOGY CONSULTATION NOTE  Date of Service: 01/25/2019  Patient Care Team: Hoyt Koch, MD as PCP - General (Internal Medicine) Lafayette Dragon, MD (Inactive) as Consulting Physician (Gastroenterology) Megan Salon, MD as Consulting Physician (Gynecology) Melrose Nakayama, MD as Consulting Physician (Orthopedic Surgery) Deneise Lever, MD as Consulting Physician (Pulmonary Disease) [name], MD (Ophthalmology)  CHIEF COMPLAINTS/PURPOSE OF CONSULTATION:  myeloma  HISTORY OF PRESENTING ILLNESS:   Faith Orr is a wonderful 78 y.o. female who has been referred to [name] by Dr. Pricilla Holm for evaluation and management of Lytic bone lesions. She is accompanied today by her son in law, and her partner is present via phone. The pt reports that she is doing well overall.   The pt notes that 2-3 months ago while standing at the stove, and otherwise feeling normally, she felt "something snap that took her breath away" in her mid back as she stretched to get something. The pt notes that she saw a chiropractor twice due to her back stiffness, which did not help. The pt then described her new bone pains to her PCP on 12/05/18, and subsequent imaging, as noted below, revealed concerns for numerous bone lesions. She has begun 94m Fosamax. The pt notes that she had hip pain in 2018, and that an XR at that time did not reveal any lesions.  The pt notes that most of her pain is concentrated to her left shoulder presently, and with minimal movement of the arm. She endorses pain radiating into her left arm and notes that her hand is swollen in the mornings when she wakes up. She also endorses present back pain and right hip pain, worse when she walks. She has not yet seen orthopedics. The pt reports that she is needing to take 6037mAdvil every 4-6 hours as Tramadol alone has not been able to alleviate her pain. The pt denies any unexpected weight loss, fevers, chills, or  night sweats. The pt notes that her urine is a very dark color presently, but denies overt blood in the urine, underpants, nor tissue paper. The pt notes that in the last two weeks she has had some soreness in her head, but denies new headaches or changes in vision. The pt notes that she has been urinating more frequently overall, but has been trying to stay better hydrated as well.  The pt notes that she has been compliant with annual mammograms.   The pt notes that she had a cyst in her right breast which was removed in the past. She fractured her left wrist in 2008 after falling down stairs. The pt endorses history of fatty liver.   The pt denies ever smoking cigarettes and endorses significant second hand smoke exposure with a previous marriage.   Of note prior to the patient's visit today, pt has had a Bone Scan completed on 12/13/18 with results revealing Multifocal uptake throughout the skeleton, consistent with diffuse metastatic disease. Primary tumor is not specified. 2. Uptake in the proximal right femur, consistent with lytic lesions. 3. Uptake in the ribs bilaterally as described. 4. Lesions in the proximal left humerus. 5. Diffuse uptake throughout the skull consistent with metastatic disease. 6. Right paramedian uptake at the manubrium.  Most recent lab results (12/08/18) of CBC w/diff and CMP is as follows: all values are WNL except for Glucose at 279, BUN at 24, AST at 41, ALT at 46. 12/08/18 SPEP revealed all values WNL except for Total Protein at 6.0,  Albumin at 3.6, Gamma globulin at 0.7, and M spike at 0.5g  On review of systems, pt reports significant left shoulder pain, back pain, right hip pain, dark urine, and denies fevers, chills, night sweats, unexpected weight loss, changes in bowel habits, changes in breathing, cough, new respiratory symptoms, changes in vision, abdominal pains, leg swelling, and any other symptoms.   On PMHx the pt reports fatty liver, and denies blood  clots.  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 Denies ever smoking.  On Family Hx the pt reports maternal grandmother with colon cancer. Father with bladder cancer and amyloidosis (pt notes that this could have been misdiagnosis). Mother with Protein S deficiency and polymyalgia rheumatica.  Interval History:   Faith Orr returns today for management and evaluation of her newly diagnosed Multiple myeloma and C1D8 treatment. The patient's last visit with [name] was on 01/10/19. The pt reports that she is doing well overall.   The pt reports that she has not had any nausea, vomiting or diarrhea and has tolerated her first cycle of Velcade and Dexamethasone well. About one day after her first shot of Velcade she did develop some left sided facial warmth and redness. She took her rosacea medicine, and the mild redness resolved within 24 hours. The pt also developed a fist sized area of redness/bruising on her lower right abdomen, which she notes is improving and lightening in color. She denies developing any neuropathy in her hands or feet.  She notes that she is eating well but does endorse some loss of appetite.  She is continuing Acyclovir. She notes that her left shoulder pain is improved and she endorses greater ease of mobility. She is taking 2 oxycodone 3 times a day with successful pain control. She notes that her left shoulder and right hip have reduced to a 7 or 8 out of 10. She endorses good energy levels.   The pt notes that she is using Senna, Metamucil, and fiber gummies regularly and is moving her bowels well.   The pt will be seeing [facility] in 5 days for evaluation.   Of note since the patient's last visit, pt has had an MRI Liver completed on 01/17/19 with results revealing Several appreciable liver lesions all have benign imaging characteristics. No MRI findings of metastatic involvement of the liver. 2. Scattered bony  lesions corresponding to the lytic lesions seen at PET-CT, compatible with active myeloma. 3.  Aortic Atherosclerosis.  Mild cardiomegaly. 4. Diffuse hepatic steatosis.  Lab results today (01/25/19) of CBC w/diff and CMP is as follows: all values are WNL except for CO2 at 20, Glucose at 295, AST at 61, ALT at 56.  On review of systems, pt reports improved left shoulder pain, improved range of motion, moving her bowels well, normal urination, good energy levels, and denies fevers, chills, nausea, vomiting, diarrhea, new bone pains, mouth sores, leg swelling, abdominal pains, and any other symptoms.    MEDICAL HISTORY:  Past Medical History:  Diagnosis Date  . Allergy    seasonal  . Asthma   . DEPRESSION   . DIABETES MELLITUS, TYPE II   . Diverticulosis   . HYPERLIPIDEMIA   . Macular degeneration of left eye    mild, Dr.Hecker  . Obesity, unspecified   . Osteoarthritis of both knees   . OSTEOPENIA   . Osteopenia   . URINARY INCONTINENCE     SURGICAL HISTORY: Past Surgical History:  Procedure Laterality Date  . BREAST SURGERY    . CATARACT EXTRACTION Left 05/24/2018  . CESAREAN SECTION  01/1973  . FRACTURE SURGERY    . left wrist surgery  2008   By Dr. Latanya Maudlin  . right ankle  1994    SOCIAL HISTORY: Social History   Socioeconomic History  . Marital status: Married    Spouse name: Not on file  . Number of children: 1  . Years of education: Not on file  . Highest education level: Not on file  Occupational History    Employer: [employer]  Social Needs  . Financial resource strain: Patient refused  . Food insecurity:    Worry: Patient refused    Inability: Patient refused  . Transportation needs:    Medical: Patient refused    Non-medical: Patient refused  Tobacco Use  . Smoking status: Never Smoker  . Smokeless tobacco: Never Used  . Tobacco comment: Lives with partner [name]) and son  Substance and Sexual Activity  . Alcohol use: No     Alcohol/week: 0.0 standard drinks  . Drug use: No  . Sexual activity: Never    Partners: Female    Birth control/protection: Post-menopausal    Comment: Lives with female partner (annette hicks) and 53 yo son  Lifestyle  . Physical activity:    Days per week: Patient refused    Minutes per session: Patient refused  . Stress: Patient refused  Relationships  . Social connections:    Talks on phone: Patient refused    Gets together: Patient refused    Attends religious service: Patient refused    Active member of club or organization: Patient refused    Attends meetings of clubs or organizations: Patient refused    Relationship status: Patient refused  . Intimate partner violence:    Fear of current or ex partner: Patient refused    Emotionally abused: Patient refused    Physically abused: Patient refused    Forced sexual activity: Patient refused  Other Topics Concern  . Not on file  Social History Narrative  . Not on file    FAMILY HISTORY: Family History  Problem Relation Age of Onset  . Diabetes Father   . Hyperlipidemia Father   . Heart disease Father   . Cancer Father   . Hypertension Father   . Colon cancer Paternal Grandmother 60  . Osteoporosis Mother   . Protein S deficiency Mother   . Hyperlipidemia Mother   . Multiple sclerosis Daughter   . Cancer Other        bladder  . Breast cancer Neg Hx     ALLERGIES:  is allergic to penicillins; aleve [naproxen sodium]; and sulfonamide derivatives.  MEDICATIONS:  Current Outpatient Medications  Medication Sig Dispense Refill  . acyclovir (ZOVIRAX) 400 MG tablet Take 1 tablet (400 mg total) by mouth 2 (two) times daily. 60 tablet 3  . Blood Glucose Monitoring Suppl (FREESTYLE FREEDOM LITE) W/DEVICE KIT Use to check blood sugars twice a day Dx 250.00 1 each 0  . Calcium Carbonate-Vitamin D (CALCIUM 600+D HIGH POTENCY) 600-400 MG-UNIT per tablet Take 1 tablet by mouth 2 (two) times daily.     Marland Kitchen dexamethasone  (DECADRON) 4 MG tablet Take 5 tablets (20 mg total) by mouth once a week. On D1,8 and 15 of each cycle of treatment 20 tablet 5  . glucose blood (FREESTYLE LITE) test strip CHECK BLOOD SUGAR TWICE DAILY AS DIRECTED Dx 250.00 180 each 3  .  Lancets (FREESTYLE) lancets Use twice daily to check sugars. 100 each 11  . metFORMIN (GLUCOPHAGE-XR) 500 MG 24 hr tablet Take 3 tablets (1,500 mg total) by mouth daily with breakfast. 270 tablet 3  . Multiple Vitamins-Minerals (ICAPS) CAPS Take 1 capsule by mouth daily.      . ondansetron (ZOFRAN) 8 MG tablet Take 1 tablet (8 mg total) by mouth 2 (two) times daily as needed (Nausea or vomiting). 30 tablet 1  . oxyCODONE (OXY IR/ROXICODONE) 5 MG immediate release tablet Take 1-2 tablets (5-10 mg total) by mouth every 6 (six) hours as needed for moderate pain or severe pain. 90 tablet 0  . polyethylene glycol (MIRALAX / GLYCOLAX) packet Take 17 g by mouth daily as needed.    . prochlorperazine (COMPAZINE) 10 MG tablet Take 1 tablet (10 mg total) by mouth every 6 (six) hours as needed (Nausea or vomiting). 30 tablet 1  . senna-docusate (SENNA S) 8.6-50 MG tablet Take 2 tablets by mouth at bedtime. 60 tablet 1  . sertraline (ZOLOFT) 50 MG tablet TAKE 1 TABLET BY MOUTH DAILY 90 tablet 1  . simvastatin (ZOCOR) 20 MG tablet TAKE 1 TABLET(20 MG) BY MOUTH DAILY 90 tablet 1  . Vitamin D, Ergocalciferol, (DRISDOL) 1.25 MG (50000 UT) CAPS capsule Take 1 capsule (50,000 Units total) by mouth every 7 (seven) days. 12 capsule 0  . albuterol (PROVENTIL HFA;VENTOLIN HFA) 108 (90 Base) MCG/ACT inhaler INHALE 1 TO 2 PUFFS INTO THE LUNGS EVERY 6 HOURS AS NEEDED FOR WHEEZING OR SHORTNESS OF BREATH (Patient not taking: Reported on 01/25/2019) 54 g 1  . aspirin EC 81 MG tablet Take 81 mg by mouth daily.    Marland Kitchen doxycycline (PERIOSTAT) 20 MG tablet Take 1 tablet by mouth as needed.  2  . fluticasone (FLONASE) 50 MCG/ACT nasal spray Place 1 spray into both nostrils daily. (Patient not taking:  Reported on 01/10/2019) 16 g 2  . ibuprofen (ADVIL) 200 MG tablet Take 600 mg by mouth 4 (four) times daily.     Marland Kitchen ketorolac (ACULAR) 0.4 % SOLN 1 drop 4 (four) times daily.    Marland Kitchen lenalidomide (REVLIMID) 15 MG capsule Take 1 capsule (15 mg total) by mouth daily. 21 capsule 1  . montelukast (SINGULAIR) 10 MG tablet TAKE 1 TABLET BY MOUTH DAILY AS NEEDED (Patient not taking: Reported on 01/10/2019) 30 tablet 3  . ondansetron (ZOFRAN) 8 MG tablet Take 1 tablet (8 mg total) by mouth every 8 (eight) hours as needed for nausea. (Patient not taking: Reported on 01/25/2019) 30 tablet 3  . pantoprazole (PROTONIX) 20 MG tablet TAKE 1 TABLET(20 MG) BY MOUTH DAILY (Patient not taking: Reported on 01/25/2019) 30 tablet 5  . predniSONE (DELTASONE) 20 MG tablet Take 2 tablets (40 mg total) by mouth daily with breakfast. (Patient not taking: Reported on 12/27/2018) 10 tablet 0   No current facility-administered medications for this visit.    Facility-Administered Medications Ordered in Other Visits  Medication Dose Route Frequency Provider Last Rate Last Dose  . bortezomib SQ (VELCADE) chemo injection 2.5 mg  1.3 mg/m2 (Treatment Plan Recorded) Subcutaneous Once Brunetta Genera, MD        REVIEW OF SYSTEMS:    A 10+ POINT REVIEW OF SYSTEMS WAS OBTAINED including neurology, dermatology, psychiatry, cardiac, respiratory, lymph, extremities, GI, GU, Musculoskeletal, constitutional, breasts, reproductive, HEENT.  All pertinent positives are noted in the HPI.  All others are negative.   PHYSICAL EXAMINATION: ECOG PERFORMANCE STATUS: 2 - Symptomatic, <50% confined to  bed  . Vitals:   01/25/19 1549  BP: 119/71  Pulse: 96  Resp: 18  Temp: (!) 97.5 F (36.4 C)  SpO2: 98%   Filed Weights   01/25/19 1549  Weight: 177 lb (80.3 kg)   .Body mass index is 35.75 kg/m.  GENERAL:alert, in no acute distress and comfortable SKIN: no acute rashes, no significant lesions EYES: conjunctiva are pink and  non-injected, sclera anicteric OROPHARYNX: MMM, no exudates, no oropharyngeal erythema or ulceration NECK: supple, no JVD LYMPH:  no palpable lymphadenopathy in the cervical, axillary or inguinal regions LUNGS: clear to auscultation b/l with normal respiratory effort HEART: regular rate & rhythm ABDOMEN:  normoactive bowel sounds , non tender, not distended. No palpable hepatosplenomegaly.  Extremity: no pedal edema PSYCH: alert & oriented x 3 with fluent speech NEURO: no focal motor/sensory deficits   LABORATORY DATA:  I have reviewed the data as listed  . CBC Latest Ref Rng & Units 01/25/2019 01/18/2019 01/06/2019  WBC 4.0 - 10.5 K/uL 7.3 5.6 6.6  Hemoglobin 12.0 - 15.0 g/dL 13.1 13.0 13.3  Hematocrit 36.0 - 46.0 % 38.8 37.9 39.5  Platelets 150 - 400 K/uL 215 237 256   . CBC    Component Value Date/Time   WBC 7.3 01/25/2019 1419   WBC 6.6 01/06/2019 0757   RBC 4.22 01/25/2019 1419   HGB 13.1 01/25/2019 1419   HCT 38.8 01/25/2019 1419   PLT 215 01/25/2019 1419   MCV 91.9 01/25/2019 1419   MCH 31.0 01/25/2019 1419   MCHC 33.8 01/25/2019 1419   RDW 12.6 01/25/2019 1419   LYMPHSABS 0.7 01/25/2019 1419   MONOABS 0.3 01/25/2019 1419   EOSABS 0.0 01/25/2019 1419   BASOSABS 0.0 01/25/2019 1419    . CMP Latest Ref Rng & Units 01/25/2019 01/18/2019 12/27/2018  Glucose 70 - 99 mg/dL 295(H) 305(H) 135(H)  BUN 8 - 23 mg/dL _0 Creatinine 0.44 - 1.00 mg/dL 0.74 0.77 0.64  Sodium 135 - 145 mmol/L 135 139 137  Potassium 3.5 - 5.1 mmol/L 4.2 3.6 4.1  Chloride 98 - 111 mmol/L 104 105 106  CO2 22 - 32 mmol/L 20(L) 21(L) 22  Calcium 8.9 - 10.3 mg/dL 9.1 10.6(H) 10.7(H)  Total Protein 6.5 - 8.1 g/dL 6.7 6.7 6.9  Total Bilirubin 0.3 - 1.2 mg/dL 0.6 0.5 0.5  Alkaline Phos 38 - 126 U/L 117 113 126  AST 15 - 41 U/L 61(H) 53(H) 63(H)  ALT 0 - 44 U/L 56(H) 50(H) 73(H)   Component     Latest Ref Rng & Units 12/27/2018  IgG (Immunoglobin G), Serum     700 - 1,600 mg/dL 801  IgA     64  - 422 mg/dL 91  IgM (Immunoglobulin M), Srm     26 - 217 mg/dL 15 (L)  Total Protein ELP     6.0 - 8.5 g/dL 6.6  Albumin SerPl Elph-Mcnc     2.9 - 4.4 g/dL 3.7  Alpha 1     0.0 - 0.4 g/dL 0.2  Alpha2 Glob SerPl Elph-Mcnc     0.4 - 1.0 g/dL 0.8  B-Globulin SerPl Elph-Mcnc     0.7 - 1.3 g/dL 1.2  Gamma Glob SerPl Elph-Mcnc     0.4 - 1.8 g/dL 0.7  M Protein SerPl Elph-Mcnc     Not Observed g/dL 0.5 (H)  Globulin, Total     2.2 - 3.9 g/dL 2.9  Albumin/Glob SerPl     0.7 - 1.7 1.3  IFE 1      Comment  Please Note (HCV):      Comment  Kappa free light chain     3.3 - 19.4 mg/L 5.1  Lamda free light chains     5.7 - 26.3 mg/L 40.3 (H)  Kappa, lamda light chain ratio     0.26 - 1.65 0.13 (L)  Beta-2 Microglobulin     0.6 - 2.4 mg/L 1.7  LDH     98 - 192 U/L 162  Sed Rate     0 - 22 mm/hr 8       01/06/19 Cytogenetics:       RADIOGRAPHIC STUDIES: I have personally reviewed the radiological images as listed and agreed with the findings in the report. Mr Liver W Wo Contrast  Result Date: 01/18/2019 CLINICAL DATA:  Multiple myeloma, hepatic activity heterogeneity on PET-CT for further investigation. EXAM: MRI ABDOMEN WITHOUT AND WITH CONTRAST TECHNIQUE: Multiplanar multisequence MR imaging of the abdomen was performed both before and after the administration of intravenous contrast. CONTRAST:  8 cc Gadavist COMPARISON:  01/05/2019 FINDINGS: Lower chest: Mild cardiomegaly. Hepatobiliary: Diffuse struck bout of signal in the liver compatible with hepatic steatosis. In segment 7 of the liver, a 1.2 by 1.1 cm T2 hyperintense lesion is observed without associated enhancement, compatible with a hepatic cyst. In the lateral segment left hepatic lobe, a 0.6 cm and separate 0.3 cm T2 hyperintense lesions are observed, both shown on image 37/11, neither enhancing and compatible with cysts. Peripheral wedge-shaped 2.3 by 2.1 cm focus of initial hypoenhancement but subsequent delayed  accentuated enhancement on late phase images was not previously hypermetabolic. This has mildly accentuated T2 signal characteristics and overall is thought to likely represent focal scarring., less likely atypical hemangioma. In the right hepatic lobe on image 24/1501 there is a small focus of arterial phase enhancement which appears very similar to the blush of enhancement shown on CT abdomen of 05/22/2011. This is fairly poorly seen on later phase images, with only some equivocal linear enhancement in the area, and could be from an unusual hemangioma/vascular malformation or more likely, focal nodular hyperplasia. No worrisome lesions to correlate with the heterogeneity seen on MRI. No biliary dilatation. Pancreas:  Unremarkable Spleen:  Unremarkable Adrenals/Urinary Tract: There are 2 tiny left renal cysts. Adrenal glands normal. Stomach/Bowel: Unremarkable Vascular/Lymphatic:  Aortoiliac atherosclerotic vascular disease. Other:  No supplemental non-categorized findings. Musculoskeletal: Scattered bony lesions are observed including a 2.8 by 1.8 cm lesion in the left side of the T9 vertebral body, corresponding to the patient's known myeloma/lytic lesions. IMPRESSION: 1. Several appreciable liver lesions all have benign imaging characteristics. No MRI findings of metastatic involvement of the liver. 2. Scattered bony lesions corresponding to the lytic lesions seen at PET-CT, compatible with active myeloma. 3.  Aortic Atherosclerosis (ICD10-I70.0).  Mild cardiomegaly. 4. Diffuse hepatic steatosis. Electronically Signed   By: Van Clines M.D.   On: 01/18/2019 09:16   Nm Pet Image Initial (pi) Whole Body  Result Date: 01/05/2019 CLINICAL DATA:  Initial treatment strategy for multiple myeloma. EXAM: NUCLEAR MEDICINE PET WHOLE BODY TECHNIQUE: 8.9 mCi F-18 FDG was injected intravenously. Full-ring PET imaging was performed from the skull base to thigh after the radiotracer. CT data was obtained and used for  attenuation correction and anatomic localization. Fasting blood glucose: 162 mg/dl COMPARISON:  Bone scan of 12/13/2018 FINDINGS: Mediastinal blood pool activity: SUV max 2.9 HEAD/NECK: A lymph node measuring 1.2 cm in short axis in the left parapharyngeal space  on image 47/4 has a maximum SUV 11.8. Incidental CT findings: none CHEST: No significant abnormal hypermetabolic activity in this region. Incidental CT findings: Mild mosaic attenuation particularly favoring the lower lobes. Old granulomatous disease. Aortic atherosclerosis. ABDOMEN/PELVIS: Accentuated heterogeneity of activity in the liver. One area of heterogeneity posteriorly in the right hepatic lobe has a maximum SUV of 5.1 as compared to the general background liver activity with maximum SUV of 4.1. A hypodense lesion posteriorly in the dome of the right hepatic lobe on image 105/4 is not discernibly hypermetabolic. Similarly there is some mild heterogeneity of the splenic activity. Physiologic activity throughout the bowel. Incidental CT findings: Old granulomatous disease involving the spleen. 4 mm nonobstructive right mid kidney calculus in the collecting system. Aortoiliac atherosclerotic vascular disease. Scattered sigmoid colon diverticula. SKELETON: Innumerable scattered small lytic lesions are present throughout the skeleton. Some of these are more confluent and measurably hypermetabolic, for example the larger lytic lesion of the left proximal humerus which has a maximum SUV of 8.1. There is a likely pathologic fracture of the right third rib with maximum SUV 3.7. Likely pathologic fracture of the right fifth rib with maximum SUV 3.5. Likely pathologic fracture of the left ninth rib posterolaterally. A 2.8 by 1.9 cm lytic lesion of the left side of the T9 vertebral body has some cortical destruction on image 107/4, and a maximum SUV of 5.1. An index lesion posteriorly in the right proximal femoral shaft measures 2.0 cm in diameter on image  216/4 with maximum SUV 4.0. Some of the lytic lesions, such as the 1.8 cm lytic lesion of the left inferior pubis and inferior pubic ramus shown on image 200/4, are not appreciably hypermetabolic. Incidental CT findings: Postoperative findings in the right ankle with K-wires along the malleoli. EXTREMITIES: No significant abnormal hypermetabolic activity in this region. Incidental CT findings: none IMPRESSION: 1. Innumerable lytic lesions in the skeleton compatible with myeloma. Most of the larger lesions are hypermetabolic, for example including a left proximal humeral shaft lesion with maximum SUV of 8.1 and a 2.8 cm lesion in the left T9 vertebral body with maximum SUV 5.1. Most of the smaller lytic lesions, and some of the larger lesions, do not demonstrate accentuated metabolic activity. 2. 1.2 cm in short axis lymph node in the left parapharyngeal space is hypermetabolic with maximum SUV 11.8. I do not see a separate mass in the head and neck to give rise to this hypermetabolic lymph node. 3. Mosaic attenuation in the lower lobes, nonspecific possibly from air trapping. 4.  Aortic Atherosclerosis (ICD10-I70.0). 5. Heterogeneous activity in the liver, making it hard to exclude small liver lesions. Consider hepatic protocol MRI with and without contrast for definitive assessment. Nonobstructive right nephrolithiasis. Old granulomatous disease. Electronically Signed   By: Van Clines M.D.   On: 01/05/2019 09:13   Ct Biopsy  Result Date: 01/06/2019 CLINICAL DATA:  Multiple lytic bone lesions with clinical suspicion of multiple myeloma. Bone marrow biopsy required for further workup. EXAM: CT GUIDED BONE MARROW ASPIRATION AND BIOPSY ANESTHESIA/SEDATION: Versed 2.0 mg IV, Fentanyl 100 mcg IV Total Moderate Sedation Time:   11 minutes. The patient's level of consciousness and physiologic status were continuously monitored during the procedure by Radiology nursing. PROCEDURE: The procedure risks, benefits,  and alternatives were explained to the patient. Questions regarding the procedure were encouraged and answered. The patient understands and consents to the procedure. A time out was performed prior to initiating the procedure. The right gluteal region was prepped with  chlorhexidine. Sterile gown and sterile gloves were used for the procedure. Local anesthesia was provided with 1% Lidocaine. Under CT guidance, an 11 gauge On Control bone cutting needle was advanced from a posterior approach into the right iliac bone. Needle positioning was confirmed with CT. Initial non heparinized and heparinized aspirate samples were obtained of bone marrow. Core biopsy was performed via the On Control drill needle. COMPLICATIONS: None FINDINGS: Inspection of initial aspirate did reveal visible particles. Intact core biopsy sample was obtained. IMPRESSION: CT guided bone marrow biopsy of right posterior iliac bone with both aspirate and core samples obtained. Electronically Signed   By: Aletta Edouard M.D.   On: 01/06/2019 14:45   Ct Bone Marrow Biopsy & Aspiration  Result Date: 01/06/2019 CLINICAL DATA:  Multiple lytic bone lesions with clinical suspicion of multiple myeloma. Bone marrow biopsy required for further workup. EXAM: CT GUIDED BONE MARROW ASPIRATION AND BIOPSY ANESTHESIA/SEDATION: Versed 2.0 mg IV, Fentanyl 100 mcg IV Total Moderate Sedation Time:   11 minutes. The patient's level of consciousness and physiologic status were continuously monitored during the procedure by Radiology nursing. PROCEDURE: The procedure risks, benefits, and alternatives were explained to the patient. Questions regarding the procedure were encouraged and answered. The patient understands and consents to the procedure. A time out was performed prior to initiating the procedure. The right gluteal region was prepped with chlorhexidine. Sterile gown and sterile gloves were used for the procedure. Local anesthesia was provided with 1%  Lidocaine. Under CT guidance, an 11 gauge On Control bone cutting needle was advanced from a posterior approach into the right iliac bone. Needle positioning was confirmed with CT. Initial non heparinized and heparinized aspirate samples were obtained of bone marrow. Core biopsy was performed via the On Control drill needle. COMPLICATIONS: None FINDINGS: Inspection of initial aspirate did reveal visible particles. Intact core biopsy sample was obtained. IMPRESSION: CT guided bone marrow biopsy of right posterior iliac bone with both aspirate and core samples obtained. Electronically Signed   By: Aletta Edouard M.D.   On: 01/06/2019 14:45    ASSESSMENT & PLAN:   78 y.o. female with  1. Newly diagnosed Multiple Myeloma, RISS Stage III  Labs upon initial presentation from 12/08/18, blood counts are normal including WBC at 7.1k, HGB at 13.1, and PLT at 245k. Calcium normal at 10.3. Creatinine normal at 0.63. M spike at 0.5g. 12/13/18 Bone Scan revealed Multifocal uptake throughout the skeleton, consistent with diffuse metastatic disease. Primary tumor is not specified. 2. Uptake in the proximal right femur, consistent with lytic lesions. 3. Uptake in the ribs bilaterally as described. 4. Lesions in the proximal left humerus. 5. Diffuse uptake throughout the skull consistent with metastatic disease. 6. Right paramedian uptake at the manubrium.  12/13/18 CT Right Femur revealed Numerous lytic lesions involving the right femur and a lytic lesion in the left inferior pubic ramus. Overall appearance is most concerning for multiple myeloma  12/27/18 Pretreatment 24hour UPEP observed an M spike at 74m, and showed 1941mtotal protein/day.  12/27/18 Pretreatment MMP revealed M Protein at 0.5g with IgG Lambda specificity. Kappa:Lambda light chain ratio at 0.13, with Lambda at 40.3. 01/05/19 PET/CT revealed Innumerable lytic lesions in the skeleton compatible with myeloma. Most of the larger lesions are  hypermetabolic, for example including a left proximal humeral shaft lesion with maximum SUV of 8.1 and a 2.8 cm lesion in the left T9 vertebral body with maximum SUV 5.1. Most of the smaller lytic lesions, and some of the  larger lesions, do not demonstrate accentuated metabolic activity. 2. 1.2 cm in short axis lymph node in the left parapharyngeal space is hypermetabolic with maximum SUV 11.8. I do not see a separate mass in the head and neck to give rise to this hypermetabolic lymph node. 3. Mosaic attenuation in the lower lobes, nonspecific possibly from air trapping. 4.  Aortic Atherosclerosis 5. Heterogeneous activity in the liver, making it hard to exclude small liver lesions. Consider hepatic protocol MRI with and without contrast for definitive assessment. Nonobstructive right nephrolithiasis. Old granulomatous disease  01/06/19 Bone Marrow biopsy revealed interstitial increase in plasma cells (28% aspirate, 40% CD138 immunohistochemistry). Plasma cells negative for light chains consistent with a non or weakly secretory myeloma   01/06/19 Cytogenetics revealed 37% of cells with trisomy 11 or 11q deletion, and 40.5% of cells with 17p mutation  2. 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   PLAN -Discussed pt labwork today, 01/25/19; blood counts remain normal, Glucose higher at 295, liver functions stable in setting of fatty liver seen on the 01/17/19 MRI -Discussed the 01/17/19 MRI Liver which revealed Several appreciable liver lesions all have benign imaging characteristics. No MRI findings of metastatic involvement of the liver. 2. Scattered bony lesions corresponding to the lytic lesions seen at PET-CT, compatible with active myeloma. 3.  Aortic Atherosclerosis.  Mild cardiomegaly. 4. Diffuse hepatic steatosis. -Discussed the 01/06/19 Cytogenetics report which revealed a 17p mutation, and trisomy 11/ 11q  deletion, indicating RISS Stage III -Will discuss this case with my tumor board, and will consider switching to Carfilzomib/revlimid/dex for high risk disease. -Recommend pt return to care with her PCP Dr. Pricilla Holm for management and evaluation of her DM, especially while on treatment with steroids as blood sugars will be higher -Previously discussed that there is less abnormal protein and light chains than I would expect from 30% plasma cells, which suggests hypo-secretory or non-secretory neoplastic plasma cells. Will have an impact in assessing response. -Continue Zometa -Continue Acyclovir and 73m aspirin to mitigate risk of Shingles and blood clots respectively -Will watch lymph node in left parapharyngeal space seen on PET/CT -[facility] for possible prophylactic interventions to mitigate risk of pathologic fractures -Avoid lifting, pulling, pushing with left upper extremity  -Avoid high impact activities and stairs to preserve right hip as well -May be a role for local radiation therapy for pain control at T9 and left shoulder -Continue 569mOxycodone q6h prn, Zofran for opiate-related nausea -Miralax once a day, and Senna S two tablets BID, back off as constipation resolves -Will see the pt back in 8 days   Please schedule for next dose of Velcade on 2/17. Cancel all other currently scheduled appointments Schedule to start Carfilzomib D1,2,8,9,15,16 from 02/02/2019 with labs Labs on D1,D8 and D15 of Carfilzomib treatment Continue Zometa q4weeks RTC with dr KaIrene Limbon C1D1 of Carfilzomib   All of the patients questions were answered with apparent satisfaction. The patient knows to call the clinic with any problems, questions or concerns.  The total time spent in the appt was 30 minutes and more than 50% was on counseling and direct patient cares.    GaSullivan LoneD MSTurahAHIVMS SCPermian Regional Medical CenterTWasc LLC Dba [facility] Surgery Centerematology/Oncology  Physician CoO[facility](Office):       [phone] cell):  [phone]):  [phone]  01/25/2019 4:49 PM  I, Baldwin Jamaica, am acting as a scribe for Dr. Sullivan Lone.   .I have reviewed the above documentation for accuracy and completeness, and I agree with the above. Brunetta Genera MD

## 2019-01-25 ENCOUNTER — Inpatient Hospital Stay (HOSPITAL_BASED_OUTPATIENT_CLINIC_OR_DEPARTMENT_OTHER): Payer: Medicare Other | Admitting: Hematology

## 2019-01-25 ENCOUNTER — Encounter: Payer: Self-pay | Admitting: Hematology

## 2019-01-25 ENCOUNTER — Inpatient Hospital Stay: Payer: Medicare Other

## 2019-01-25 VITALS — BP 119/71 | HR 96 | Temp 97.5°F | Resp 18 | Ht 59.0 in | Wt 177.0 lb

## 2019-01-25 DIAGNOSIS — E119 Type 2 diabetes mellitus without complications: Secondary | ICD-10-CM | POA: Diagnosis not present

## 2019-01-25 DIAGNOSIS — C7951 Secondary malignant neoplasm of bone: Secondary | ICD-10-CM

## 2019-01-25 DIAGNOSIS — K5900 Constipation, unspecified: Secondary | ICD-10-CM | POA: Diagnosis not present

## 2019-01-25 DIAGNOSIS — M25512 Pain in left shoulder: Secondary | ICD-10-CM | POA: Diagnosis not present

## 2019-01-25 DIAGNOSIS — C9000 Multiple myeloma not having achieved remission: Secondary | ICD-10-CM

## 2019-01-25 DIAGNOSIS — Z5112 Encounter for antineoplastic immunotherapy: Secondary | ICD-10-CM | POA: Diagnosis not present

## 2019-01-25 DIAGNOSIS — Z7982 Long term (current) use of aspirin: Secondary | ICD-10-CM

## 2019-01-25 DIAGNOSIS — Z7189 Other specified counseling: Secondary | ICD-10-CM

## 2019-01-25 DIAGNOSIS — R630 Anorexia: Secondary | ICD-10-CM

## 2019-01-25 LAB — CBC WITH DIFFERENTIAL (CANCER CENTER ONLY)
Abs Immature Granulocytes: 0.02 10*3/uL (ref 0.00–0.07)
Basophils Absolute: 0 10*3/uL (ref 0.0–0.1)
Basophils Relative: 0 %
Eosinophils Absolute: 0 10*3/uL (ref 0.0–0.5)
Eosinophils Relative: 0 %
HEMATOCRIT: 38.8 % (ref 36.0–46.0)
Hemoglobin: 13.1 g/dL (ref 12.0–15.0)
Immature Granulocytes: 0 %
LYMPHS ABS: 0.7 10*3/uL (ref 0.7–4.0)
Lymphocytes Relative: 9 %
MCH: 31 pg (ref 26.0–34.0)
MCHC: 33.8 g/dL (ref 30.0–36.0)
MCV: 91.9 fL (ref 80.0–100.0)
MONO ABS: 0.3 10*3/uL (ref 0.1–1.0)
MONOS PCT: 4 %
Neutro Abs: 6.3 10*3/uL (ref 1.7–7.7)
Neutrophils Relative %: 87 %
Platelet Count: 215 10*3/uL (ref 150–400)
RBC: 4.22 MIL/uL (ref 3.87–5.11)
RDW: 12.6 % (ref 11.5–15.5)
WBC Count: 7.3 10*3/uL (ref 4.0–10.5)
nRBC: 0 % (ref 0.0–0.2)

## 2019-01-25 LAB — CMP (CANCER CENTER ONLY)
ALT: 56 U/L — ABNORMAL HIGH (ref 0–44)
AST: 61 U/L — ABNORMAL HIGH (ref 15–41)
Albumin: 3.6 g/dL (ref 3.5–5.0)
Alkaline Phosphatase: 117 U/L (ref 38–126)
Anion gap: 11 (ref 5–15)
BUN: 15 mg/dL (ref 8–23)
CALCIUM: 9.1 mg/dL (ref 8.9–10.3)
CO2: 20 mmol/L — ABNORMAL LOW (ref 22–32)
Chloride: 104 mmol/L (ref 98–111)
Creatinine: 0.74 mg/dL (ref 0.44–1.00)
GFR, Estimated: >60 mL/min (ref >60)
GLUCOSE: 295 mg/dL — AB (ref 70–99)
Potassium: 4.2 mmol/L (ref 3.5–5.1)
Sodium: 135 mmol/L (ref 135–145)
Total Bilirubin: 0.6 mg/dL (ref 0.3–1.2)
Total Protein: 6.7 g/dL (ref 6.5–8.1)

## 2019-01-25 MED ORDER — PROCHLORPERAZINE MALEATE 10 MG PO TABS
10.0000 mg | ORAL_TABLET | Freq: Once | ORAL | Status: AC
  Administered 2019-01-25: 10 mg via ORAL

## 2019-01-25 MED ORDER — BORTEZOMIB CHEMO SQ INJECTION 3.5 MG (2.5MG/ML)
1.3000 mg/m2 | Freq: Once | INTRAMUSCULAR | Status: AC
  Administered 2019-01-25: 2.5 mg via SUBCUTANEOUS
  Filled 2019-01-25: qty 1

## 2019-01-25 MED ORDER — PROCHLORPERAZINE MALEATE 10 MG PO TABS
ORAL_TABLET | ORAL | Status: AC
  Filled 2019-01-25: qty 1

## 2019-01-25 MED ORDER — LENALIDOMIDE 15 MG PO CAPS: 15.0000 mg | ORAL_CAPSULE | Freq: Every day | ORAL | 1 refills | Status: DC

## 2019-01-25 NOTE — Progress Notes (Signed)
Met with patient to introduce myself as Arboriculturist and to offer available resources.  Discussed the one-time $99 [place] and qualifications to assist with personal expenses while going through treatment. Patient states her household is over the income guidelines given.  Discussed available funds through [employer] for copay assistance for treatment. There have been technical difficulties with their portal. Gave patient the information regarding the grant and their phone number which she can apply via phone if she wishes. Advised if approved to bring in a copy of the approval letter for [name] to submit claims. She verbalized understanding.  She has my contact name and number for any additional financial questions or concerns.

## 2019-01-25 NOTE — Patient Instructions (Signed)
[facility] Discharge Instructions for Patients Receiving Chemotherapy  Today you received the following chemotherapy agents Velcade  To help prevent nausea and vomiting after your treatment, we encourage you to take your nausea medication as prescribed.   If you develop nausea and vomiting that is not controlled by your nausea medication, call the clinic.   BELOW ARE SYMPTOMS THAT SHOULD BE REPORTED IMMEDIATELY:  *FEVER GREATER THAN 100.5 F  *CHILLS WITH OR WITHOUT FEVER  NAUSEA AND VOMITING THAT IS NOT CONTROLLED WITH YOUR NAUSEA MEDICATION  *UNUSUAL SHORTNESS OF BREATH  *UNUSUAL BRUISING OR BLEEDING  TENDERNESS IN MOUTH AND THROAT WITH OR WITHOUT PRESENCE OF ULCERS  *URINARY PROBLEMS  *BOWEL PROBLEMS  UNUSUAL RASH Items with * indicate a potential emergency and should be followed up as soon as possible.  Feel free to call the clinic should you have any questions or concerns. The clinic phone number is [phone].  Please show the [place] at check-in to the Emergency Department and triage nurse.  Bortezomib injection(Velcade) What is this medicine? BORTEZOMIB (bor TEZ oh mib) is a medicine that targets proteins in cancer cells and stops the cancer cells from growing. It is used to treat multiple myeloma and mantle-cell lymphoma. This medicine may be used for other purposes; ask your health care provider or pharmacist if you have questions. COMMON BRAND NAME(S): Velcade What should I tell my health care provider before I take this medicine? They need to know if you have any of these conditions: -diabetes -heart disease -irregular heartbeat -liver disease -on hemodialysis -low blood counts, like low white blood cells, platelets, or hemoglobin -peripheral neuropathy -taking medicine for blood pressure -an unusual or allergic reaction to bortezomib, mannitol, boron, other medicines, foods, dyes, or preservatives -pregnant or trying to get  pregnant -breast-feeding How should I use this medicine? This medicine is for injection into a vein or for injection under the skin. It is given by a health care professional in a hospital or clinic setting. Talk to your pediatrician regarding the use of this medicine in children. Special care may be needed. Overdosage: If you think you have taken too much of this medicine contact a poison control center or emergency room at once. NOTE: This medicine is only for you. Do not share this medicine with others. What if I miss a dose? It is important not to miss your dose. Call your doctor or health care professional if you are unable to keep an appointment. What may interact with this medicine? This medicine may interact with the following medications: -ketoconazole -rifampin -ritonavir -St. John's Wort This list may not describe all possible interactions. Give your health care provider a list of all the medicines, herbs, non-prescription drugs, or dietary supplements you use. Also tell them if you smoke, drink alcohol, or use illegal drugs. Some items may interact with your medicine. What should I watch for while using this medicine? You may get drowsy or dizzy. Do not drive, use machinery, or do anything that needs mental alertness until you know how this medicine affects you. Do not stand or sit up quickly, especially if you are an older patient. This reduces the risk of dizzy or fainting spells. In some cases, you may be given additional medicines to help with side effects. Follow all directions for their use. Call your doctor or health care professional for advice if you get a fever, chills or sore throat, or other symptoms of a cold or flu. Do not treat  yourself. This drug decreases your body's ability to fight infections. Try to avoid being around people who are sick. This medicine may increase your risk to bruise or bleed. Call your doctor or health care professional if you notice any unusual  bleeding. You may need blood work done while you are taking this medicine. In some patients, this medicine may cause a serious brain infection that may cause death. If you have any problems seeing, thinking, speaking, walking, or standing, tell your doctor right away. If you cannot reach your doctor, urgently seek other source of medical care. Check with your doctor or health care professional if you get an attack of severe diarrhea, nausea and vomiting, or if you sweat a lot. The loss of too much body fluid can make it dangerous for you to take this medicine. Do not become pregnant while taking this medicine or for at least 7 months after stopping it. Women should inform their doctor if they wish to become pregnant or think they might be pregnant. Men should not father a child while taking this medicine and for at least 4 months after stopping it. There is a potential for serious side effects to an unborn child. Talk to your health care professional or pharmacist for more information. Do not breast-feed an infant while taking this medicine or for 2 months after stopping it. This medicine may interfere with the ability to have a child. You should talk with your doctor or health care professional if you are concerned about your fertility. What side effects may I notice from receiving this medicine? Side effects that you should report to your doctor or health care professional as soon as possible: -allergic reactions like skin rash, itching or hives, swelling of the face, lips, or tongue -breathing problems -changes in hearing -changes in vision -fast, irregular heartbeat -feeling faint or lightheaded, falls -pain, tingling, numbness in the hands or feet -right upper belly pain -seizures -swelling of the ankles, feet, hands -unusual bleeding or bruising -unusually weak or tired -vomiting -yellowing of the eyes or skin Side effects that usually do not require medical attention (report to your  doctor or health care professional if they continue or are bothersome): -changes in emotions or moods -constipation -diarrhea -loss of appetite -headache -irritation at site where injected -nausea This list may not describe all possible side effects. Call your doctor for medical advice about side effects. You may report side effects to FDA at 1-800-FDA-1088. Where should I keep my medicine? This drug is given in a hospital or clinic and will not be stored at home. NOTE: This sheet is a summary. It may not cover all possible information. If you have questions about this medicine, talk to your doctor, pharmacist, or health care provider.  2019 Elsevier/Gold Standard (2018-04-11 16:29:31)   Zoledronic Acid injection (Hypercalcemia, Oncology) (Zometa) What is this medicine? ZOLEDRONIC ACID (ZOE le dron ik AS id) lowers the amount of calcium loss from bone. It is used to treat too much calcium in your blood from cancer. It is also used to prevent complications of cancer that has spread to the bone. This medicine may be used for other purposes; ask your health care provider or pharmacist if you have questions. COMMON BRAND NAME(S): Zometa What should I tell my health care provider before I take this medicine? They need to know if you have any of these conditions: -aspirin-sensitive asthma -cancer, especially if you are receiving medicines used to treat cancer -dental disease or wear dentures -infection -  kidney disease -receiving corticosteroids like dexamethasone or prednisone -an unusual or allergic reaction to zoledronic acid, other medicines, foods, dyes, or preservatives -pregnant or trying to get pregnant -breast-feeding How should I use this medicine? This medicine is for infusion into a vein. It is given by a health care professional in a hospital or clinic setting. Talk to your pediatrician regarding the use of this medicine in children. Special care may be needed. Overdosage: If  you think you have taken too much of this medicine contact a poison control center or emergency room at once. NOTE: This medicine is only for you. Do not share this medicine with others. What if I miss a dose? It is important not to miss your dose. Call your doctor or health care professional if you are unable to keep an appointment. What may interact with this medicine? -certain antibiotics given by injection -NSAIDs, medicines for pain and inflammation, like ibuprofen or naproxen -some diuretics like bumetanide, furosemide -teriparatide -thalidomide This list may not describe all possible interactions. Give your health care provider a list of all the medicines, herbs, non-prescription drugs, or dietary supplements you use. Also tell them if you smoke, drink alcohol, or use illegal drugs. Some items may interact with your medicine. What should I watch for while using this medicine? Visit your doctor or health care professional for regular checkups. It may be some time before you see the benefit from this medicine. Do not stop taking your medicine unless your doctor tells you to. Your doctor may order blood tests or other tests to see how you are doing. Women should inform their doctor if they wish to become pregnant or think they might be pregnant. There is a potential for serious side effects to an unborn child. Talk to your health care professional or pharmacist for more information. You should make sure that you get enough calcium and vitamin D while you are taking this medicine. Discuss the foods you eat and the vitamins you take with your health care professional. Some people who take this medicine have severe bone, joint, and/or muscle pain. This medicine may also increase your risk for jaw problems or a broken thigh bone. Tell your doctor right away if you have severe pain in your jaw, bones, joints, or muscles. Tell your doctor if you have any pain that does not go away or that gets  worse. Tell your dentist and dental surgeon that you are taking this medicine. You should not have major dental surgery while on this medicine. See your dentist to have a dental exam and fix any dental problems before starting this medicine. Take good care of your teeth while on this medicine. Make sure you see your dentist for regular follow-up appointments. What side effects may I notice from receiving this medicine? Side effects that you should report to your doctor or health care professional as soon as possible: -allergic reactions like skin rash, itching or hives, swelling of the face, lips, or tongue -anxiety, confusion, or depression -breathing problems -changes in vision -eye pain -feeling faint or lightheaded, falls -jaw pain, especially after dental work -mouth sores -muscle cramps, stiffness, or weakness -redness, blistering, peeling or loosening of the skin, including inside the mouth -trouble passing urine or change in the amount of urine Side effects that usually do not require medical attention (report to your doctor or health care professional if they continue or are bothersome): -bone, joint, or muscle pain -constipation -diarrhea -fever -hair loss -irritation at site where injected -  loss of appetite -nausea, vomiting -stomach upset -trouble sleeping -trouble swallowing -weak or tired This list may not describe all possible side effects. Call your doctor for medical advice about side effects. You may report side effects to FDA at 1-800-FDA-1088. Where should I keep my medicine? This drug is given in a hospital or clinic and will not be stored at home. NOTE: This sheet is a summary. It may not cover all possible information. If you have questions about this medicine, talk to your doctor, pharmacist, or health care provider.  2019 Elsevier/Gold Standard (2014-04-28 14:19:39)

## 2019-01-25 NOTE — Patient Instructions (Signed)
Thank you for choosing [facility] to provide your oncology and hematology care.  To afford each patient quality time with our providers, please arrive 30 minutes before your scheduled appointment time.  If you arrive late for your appointment, you may be asked to reschedule.  We strive to give you quality time with our providers, and arriving late affects you and other patients whose appointments are after yours.    If you are a no show for multiple scheduled visits, you may be dismissed from the clinic at the providers discretion.     Again, thank you for choosing [facility], our hope is that these requests will decrease the amount of time that you wait before being seen by our physicians.  ______________________________________________________________________   Should you have questions after your visit to the [facility], please contact our office at [phone] between the hours of 8:30 and 4:30 p.m.    Voicemails left after 4:30p.m will not be returned until the following business day.     For prescription refill requests, please have your pharmacy contact us directly.  Please also try to allow 48 hours for prescription requests.     Please contact the scheduling department for questions regarding scheduling.  For scheduling of procedures such as PET scans, CT scans, MRI, Ultrasound, etc please contact central scheduling at [phone].     Resources For Cancer Patients and Caregivers:    Oncolink.org:  A wonderful resource for patients and healthcare providers for information regarding your disease, ways to tract your treatment, what to expect, etc.      American Cancer Society:  800-227-2345  Can help patients locate various types of support and financial assistance   Cancer Care: 1-800-813-HOPE (4673) Provides financial assistance, online support groups, medication/co-pay assistance.     [place] DSS:  336-641-3447 Where to apply  for food stamps, Medicaid, and utility assistance   Medicare Rights Center: 800-333-4114 Helps people with Medicare understand their rights and benefits, navigate the Medicare system, and secure the quality healthcare they deserve   SCAT: 336-333-6589 [place] [employer]'s shared-ride transportation service for eligible riders who have a disability that prevents them from riding the fixed route bus.     For additional information on assistance programs please contact our social worker:   Abigail Elmore:  [phone]  

## 2019-01-26 ENCOUNTER — Telehealth: Payer: Self-pay | Admitting: Hematology

## 2019-01-26 ENCOUNTER — Telehealth: Payer: Self-pay | Admitting: *Deleted

## 2019-01-26 NOTE — Telephone Encounter (Signed)
Scheduled appts per 02/12 los.  No treatment plan was active.  Md nurse will call the patient and confirm the appts with the patient, due to the Md holding off on the velcade.

## 2019-01-26 NOTE — Telephone Encounter (Signed)
Contacted patient and reviewed new treatment schedule and new appts. Patient has appt with orthopedist at [facility] on Monday and cannot come for Velcade on that date as ordered. Per Dr. Irene Limbo, ok to miss that dose of Velcade and new time will be discussed on next appt 2/20. Patient verbalized understanding of appointments next week and stated she will get calendar for further appts when she comes in. Advised her that Revlimid requires a patient/MD agreement form to be signed when she's here next Thursday 2/20 and that she will receive education from Oral Chemo pharmacist before she begins taking it. Patient verbalized understanding.

## 2019-01-27 ENCOUNTER — Telehealth: Payer: Self-pay

## 2019-01-27 ENCOUNTER — Telehealth: Payer: Self-pay | Admitting: Pharmacist

## 2019-01-27 DIAGNOSIS — C9000 Multiple myeloma not having achieved remission: Secondary | ICD-10-CM

## 2019-01-27 NOTE — Telephone Encounter (Signed)
Oral Oncology Pharmacist Encounter  Received new prescription for Revlimid (lenalidomide) for the treatment of multiple myeloma not having achieved remission, in conjunction with Velcade and dexamethasone, planned duration 4-6 cycles and then reassessment.  Patient started on treatment with Velcade and dexamethasone on 01/18/2019, Revlimid is now being added to treatment plan for better disease control  Labs from 01/25/19 assessed, OK for treatment. Noted slight elevations in LFTs, will continue to be monitored SCr=0.74, round to 1.0 for age >42, est CrCl ~ 60 mL/min  Revlimid dose has been reduced to 16m once daily for 21 days on, 7 days off, for reduced renal function  Current medication list in Epic reviewed, no DDIs with Revlimid identified.  Prescription will be E scribed to appropriate specialty pharmacy for dispensing once insurance authorization is approved and [employer] authorization number is obtained.  Revlimid is not available [facility] as it is a limited distribution medication.  Oral Oncology Clinic will continue to follow for insurance authorization, copayment issues, initial counseling and start date.  JJohny Drilling PharmD, BCPS, BCOP  01/27/2019 1:05 PM Oral Oncology Clinic [phone]

## 2019-01-27 NOTE — Telephone Encounter (Signed)
Oral Oncology Patient Advocate Encounter  Prior Authorization for Revlimid has been approved.    PA# [id number] Effective dates: 01/27/19 through 12/14/19  Oral Oncology Clinic will continue to follow.   [place] Patient [place] Phone [phone] Fax [phone]

## 2019-01-27 NOTE — Telephone Encounter (Signed)
Oral Oncology Patient Advocate Encounter  Received notification from [place] that prior authorization for Revlimid is required.  PA submitted on CoverMyMeds Key [id number] Status is pending  Oral Oncology Clinic will continue to follow.  [place] Patient [place] Phone [phone] Fax [phone]

## 2019-01-30 ENCOUNTER — Telehealth: Payer: Self-pay

## 2019-01-30 ENCOUNTER — Encounter: Payer: Self-pay | Admitting: Hematology

## 2019-01-30 ENCOUNTER — Ambulatory Visit: Payer: Medicare Other

## 2019-01-30 NOTE — Progress Notes (Signed)
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

## 2019-01-30 NOTE — Telephone Encounter (Signed)
Oral Oncology Patient Advocate Encounter  I was successful at securing a grant with [facility] for $10,000. This will keep the out of pocket expense for Revlimid at $0. The grant information is as follows and has been shared with Marydel.  Approval dates: 12/31/18-12/31/19 ID: [id number] Group: [id number] BIN: [id number] PCN: [id number]  The patient is aware I am getting this for her and I will give her details at her OV on 2/20.  [place] Patient [place] Phone [phone] Fax [phone]

## 2019-01-31 ENCOUNTER — Telehealth: Payer: Self-pay | Admitting: *Deleted

## 2019-01-31 ENCOUNTER — Other Ambulatory Visit: Payer: Self-pay | Admitting: Hematology

## 2019-01-31 MED ORDER — OXYCODONE HCL 10 MG PO TABS: 10.0000 mg | ORAL_TABLET | Freq: Four times a day (QID) | ORAL | 0 refills | Status: DC | PRN

## 2019-01-31 NOTE — Telephone Encounter (Signed)
Per Dr. Irene Limbo: Call patient and inform that pain medicine will be a different dose, written or a 10 mg tablet since she is taking 2-5mg  tablets each time. Patient notified of change in dose and verbalized understanding.

## 2019-02-01 ENCOUNTER — Encounter: Payer: Self-pay | Admitting: Hematology

## 2019-02-01 ENCOUNTER — Ambulatory Visit: Payer: Medicare Other

## 2019-02-01 ENCOUNTER — Other Ambulatory Visit: Payer: Self-pay | Admitting: *Deleted

## 2019-02-01 DIAGNOSIS — C9000 Multiple myeloma not having achieved remission: Secondary | ICD-10-CM

## 2019-02-01 NOTE — Progress Notes (Signed)
HEMATOLOGY/ONCOLOGY CLINIC NOTE  Date of Service: 02/02/2019  Patient Care Team: Hoyt Koch, MD as PCP - General (Internal Medicine) Lafayette Dragon, MD (Inactive) as Consulting Physician (Gastroenterology) Megan Salon, MD as Consulting Physician (Gynecology) Melrose Nakayama, MD as Consulting Physician (Orthopedic Surgery) Deneise Lever, MD as Consulting Physician (Pulmonary Disease) [name], MD (Ophthalmology)  CHIEF COMPLAINTS/PURPOSE OF CONSULTATION:  myeloma  HISTORY OF PRESENTING ILLNESS:   Faith Orr is a wonderful 78 y.o. female who has been referred to [name] by Dr. Pricilla Holm for evaluation and management of Lytic bone lesions. She is accompanied today by her son in law, and her partner is present via phone. The pt reports that she is doing well overall.   The pt notes that 2-3 months ago while standing at the stove, and otherwise feeling normally, she felt "something snap that took her breath away" in her mid back as she stretched to get something. The pt notes that she saw a chiropractor twice due to her back stiffness, which did not help. The pt then described her new bone pains to her PCP on 12/05/18, and subsequent imaging, as noted below, revealed concerns for numerous bone lesions. She has begun 10m Fosamax. The pt notes that she had hip pain in 2018, and that an XR at that time did not reveal any lesions.  The pt notes that most of her pain is concentrated to her left shoulder presently, and with minimal movement of the arm. She endorses pain radiating into her left arm and notes that her hand is swollen in the mornings when she wakes up. She also endorses present back pain and right hip pain, worse when she walks. She has not yet seen orthopedics. The pt reports that she is needing to take 6094mAdvil every 4-6 hours as Tramadol alone has not been able to alleviate her pain. The pt denies any unexpected weight loss, fevers, chills, or night  sweats. The pt notes that her urine is a very dark color presently, but denies overt blood in the urine, underpants, nor tissue paper. The pt notes that in the last two weeks she has had some soreness in her head, but denies new headaches or changes in vision. The pt notes that she has been urinating more frequently overall, but has been trying to stay better hydrated as well.  The pt notes that she has been compliant with annual mammograms.   The pt notes that she had a cyst in her right breast which was removed in the past. She fractured her left wrist in 2008 after falling down stairs. The pt endorses history of fatty liver.   The pt denies ever smoking cigarettes and endorses significant second hand smoke exposure with a previous marriage.   Of note prior to the patient's visit today, pt has had a Bone Scan completed on 12/13/18 with results revealing Multifocal uptake throughout the skeleton, consistent with diffuse metastatic disease. Primary tumor is not specified. 2. Uptake in the proximal right femur, consistent with lytic lesions. 3. Uptake in the ribs bilaterally as described. 4. Lesions in the proximal left humerus. 5. Diffuse uptake throughout the skull consistent with metastatic disease. 6. Right paramedian uptake at the manubrium.  Most recent lab results (12/08/18) of CBC w/diff and CMP is as follows: all values are WNL except for Glucose at 279, BUN at 24, AST at 41, ALT at 46. 12/08/18 SPEP revealed all values WNL except for Total Protein at 6.0,  Albumin at 3.6, Gamma globulin at 0.7, and M spike at 0.5g  On review of systems, pt reports significant left shoulder pain, back pain, right hip pain, dark urine, and denies fevers, chills, night sweats, unexpected weight loss, changes in bowel habits, changes in breathing, cough, new respiratory symptoms, changes in vision, abdominal pains, leg swelling, and any other symptoms.   On PMHx the pt reports fatty liver, and denies blood clots.    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 Denies ever smoking.  On Family Hx the pt reports maternal grandmother with colon cancer. Father with bladder cancer and amyloidosis (pt notes that this could have been misdiagnosis). Mother with Protein S deficiency and polymyalgia rheumatica.  Interval History:   Faith Orr returns today for management and evaluation of her newly diagnosed Multiple myeloma and C1D8 treatment. The patient's last visit with [name] was on 01/25/19. The pt reports that she is doing well overall.   The pt reports that she will now be seeing orthopedics at [facility] on 02/21/19. She has been taking 6 of the '5mg'$  Oxycodone each day. She notes that her pain is controlled pretty well, until about 5 hours since her previous dose. She has pain in her right femur, left shoulder, and lower back.  Lab results today (02/02/19) of CBC w/diff and CMP is as follows: all values are WNL except for CO2 at 21, Glucose at 216, Calcium at 10.4, AST at 63, ALT at 62.  On review of systems, pt reports stable energy levels, eating well, fairly controlled pain, and denies new bone pains, constipation, and any other symptoms.    MEDICAL HISTORY:  Past Medical History:  Diagnosis Date  . Allergy    seasonal  . Asthma   . DEPRESSION   . DIABETES MELLITUS, TYPE II   . Diverticulosis   . HYPERLIPIDEMIA   . Macular degeneration of left eye    mild, Dr.Hecker  . Obesity, unspecified   . Osteoarthritis of both knees   . OSTEOPENIA   . Osteopenia   . URINARY INCONTINENCE     SURGICAL HISTORY: Past Surgical History:  Procedure Laterality Date  . BREAST SURGERY    . CATARACT EXTRACTION Left 05/24/2018  . CESAREAN SECTION  01/1973  . FRACTURE SURGERY    . left wrist surgery  2008   By Dr. Latanya Maudlin  . right ankle  1994    SOCIAL HISTORY: Social History   Socioeconomic History  . Marital status: Married    Spouse name: Not on file  . Number of  children: 1  . Years of education: Not on file  . Highest education level: Not on file  Occupational History    Employer: [employer]  Social Needs  . Financial resource strain: Patient refused  . Food insecurity:    Worry: Patient refused    Inability: Patient refused  . Transportation needs:    Medical: Patient refused    Non-medical: Patient refused  Tobacco Use  . Smoking status: Never Smoker  . Smokeless tobacco: Never Used  . Tobacco comment: Lives with partner [name]) and son  Substance and Sexual Activity  . Alcohol use: No    Alcohol/week: 0.0 standard drinks  . Drug use: No  . Sexual activity: Never    Partners: Female    Birth control/protection: Post-menopausal    Comment: Lives with female partner (annette hicks) and 4 yo son  Lifestyle  .  Physical activity:    Days per week: Patient refused    Minutes per session: Patient refused  . Stress: Patient refused  Relationships  . Social connections:    Talks on phone: Patient refused    Gets together: Patient refused    Attends religious service: Patient refused    Active member of club or organization: Patient refused    Attends meetings of clubs or organizations: Patient refused    Relationship status: Patient refused  . Intimate partner violence:    Fear of current or ex partner: Patient refused    Emotionally abused: Patient refused    Physically abused: Patient refused    Forced sexual activity: Patient refused  Other Topics Concern  . Not on file  Social History Narrative  . Not on file    FAMILY HISTORY: Family History  Problem Relation Age of Onset  . Diabetes Father   . Hyperlipidemia Father   . Heart disease Father   . Cancer Father   . Hypertension Father   . Colon cancer Paternal Grandmother 88  . Osteoporosis Mother   . Protein S deficiency Mother   . Hyperlipidemia Mother   . Multiple sclerosis Daughter   . Cancer Other        bladder  . Breast cancer Neg Hx       ALLERGIES:  is allergic to penicillins; aleve [naproxen sodium]; and sulfonamide derivatives.  MEDICATIONS:  Current Outpatient Medications  Medication Sig Dispense Refill  . acyclovir (ZOVIRAX) 400 MG tablet Take 1 tablet (400 mg total) by mouth 2 (two) times daily. 60 tablet 3  . albuterol (PROVENTIL HFA;VENTOLIN HFA) 108 (90 Base) MCG/ACT inhaler INHALE 1 TO 2 PUFFS INTO THE LUNGS EVERY 6 HOURS AS NEEDED FOR WHEEZING OR SHORTNESS OF BREATH (Patient not taking: Reported on 01/25/2019) 54 g 1  . aspirin EC 81 MG tablet Take 81 mg by mouth daily.    . Blood Glucose Monitoring Suppl (FREESTYLE FREEDOM LITE) W/DEVICE KIT Use to check blood sugars twice a day Dx 250.00 1 each 0  . Calcium Carbonate-Vitamin D (CALCIUM 600+D HIGH POTENCY) 600-400 MG-UNIT per tablet Take 1 tablet by mouth 2 (two) times daily.     Marland Kitchen dexamethasone (DECADRON) 4 MG tablet Take 5 tablets (20 mg total) by mouth once a week. On D1,8 and 15 of each cycle of treatment 20 tablet 5  . doxycycline (PERIOSTAT) 20 MG tablet Take 1 tablet by mouth as needed.  2  . fentaNYL (DURAGESIC) 12 MCG/HR Place 1 patch onto the skin every 3 (three) days. 10 patch 0  . fluticasone (FLONASE) 50 MCG/ACT nasal spray Place 1 spray into both nostrils daily. (Patient not taking: Reported on 01/10/2019) 16 g 2  . glucose blood (FREESTYLE LITE) test strip CHECK BLOOD SUGAR TWICE DAILY AS DIRECTED Dx 250.00 180 each 3  . ibuprofen (ADVIL) 200 MG tablet Take 600 mg by mouth 4 (four) times daily.     Marland Kitchen ketorolac (ACULAR) 0.4 % SOLN 1 drop 4 (four) times daily.    . Lancets (FREESTYLE) lancets Use twice daily to check sugars. 100 each 11  . lenalidomide (REVLIMID) 15 MG capsule Take 1 capsule (15 mg total) by mouth daily. 21 capsule 1  . metFORMIN (GLUCOPHAGE-XR) 500 MG 24 hr tablet Take 3 tablets (1,500 mg total) by mouth daily with breakfast. 270 tablet 3  . montelukast (SINGULAIR) 10 MG tablet TAKE 1 TABLET BY MOUTH DAILY AS NEEDED (Patient not  taking: Reported on 01/10/2019) 30  tablet 3  . Multiple Vitamins-Minerals (ICAPS) CAPS Take 1 capsule by mouth daily.      . ondansetron (ZOFRAN) 8 MG tablet Take 1 tablet (8 mg total) by mouth every 8 (eight) hours as needed for nausea. (Patient not taking: Reported on 01/25/2019) 30 tablet 3  . ondansetron (ZOFRAN) 8 MG tablet Take 1 tablet (8 mg total) by mouth 2 (two) times daily as needed (Nausea or vomiting). 30 tablet 1  . oxyCODONE 10 MG TABS Take 1 tablet (10 mg total) by mouth every 6 (six) hours as needed for moderate pain or severe pain. 90 tablet 0  . pantoprazole (PROTONIX) 20 MG tablet TAKE 1 TABLET(20 MG) BY MOUTH DAILY (Patient not taking: Reported on 01/25/2019) 30 tablet 5  . polyethylene glycol (MIRALAX / GLYCOLAX) packet Take 17 g by mouth daily as needed.    . predniSONE (DELTASONE) 20 MG tablet Take 2 tablets (40 mg total) by mouth daily with breakfast. (Patient not taking: Reported on 12/27/2018) 10 tablet 0  . prochlorperazine (COMPAZINE) 10 MG tablet Take 1 tablet (10 mg total) by mouth every 6 (six) hours as needed (Nausea or vomiting). 30 tablet 1  . senna-docusate (SENNA S) 8.6-50 MG tablet Take 2 tablets by mouth at bedtime. 60 tablet 1  . sertraline (ZOLOFT) 50 MG tablet TAKE 1 TABLET BY MOUTH DAILY 90 tablet 1  . simvastatin (ZOCOR) 20 MG tablet TAKE 1 TABLET(20 MG) BY MOUTH DAILY 90 tablet 1  . Vitamin D, Ergocalciferol, (DRISDOL) 1.25 MG (50000 UT) CAPS capsule Take 1 capsule (50,000 Units total) by mouth every 7 (seven) days. 12 capsule 0   No current facility-administered medications for this visit.     REVIEW OF SYSTEMS:    A 10+ POINT REVIEW OF SYSTEMS WAS OBTAINED including neurology, dermatology, psychiatry, cardiac, respiratory, lymph, extremities, GI, GU, Musculoskeletal, constitutional, breasts, reproductive, HEENT.  All pertinent positives are noted in the HPI.  All others are negative.   PHYSICAL EXAMINATION: ECOG PERFORMANCE STATUS: 2 - Symptomatic,  <50% confined to bed  . Vitals:   02/02/19 1355  BP: 137/70  Pulse: 91  Resp: 18  Temp: 98 F (36.7 C)  SpO2: 96%   Filed Weights   02/02/19 1355  Weight: 178 lb 8 oz (81 kg)   .Body mass index is 36.05 kg/m.  GENERAL:alert, in no acute distress and comfortable SKIN: no acute rashes, no significant lesions EYES: conjunctiva are pink and non-injected, sclera anicteric OROPHARYNX: MMM, no exudates, no oropharyngeal erythema or ulceration NECK: supple, no JVD LYMPH:  no palpable lymphadenopathy in the cervical, axillary or inguinal regions LUNGS: clear to auscultation b/l with normal respiratory effort HEART: regular rate & rhythm ABDOMEN:  normoactive bowel sounds , non tender, not distended. No palpable hepatosplenomegaly.  Extremity: no pedal edema PSYCH: alert & oriented x 3 with fluent speech NEURO: no focal motor/sensory deficits   LABORATORY DATA:  I have reviewed the data as listed  . CBC Latest Ref Rng & Units 02/02/2019 01/25/2019 01/18/2019  WBC 4.0 - 10.5 K/uL 7.7 7.3 5.6  Hemoglobin 12.0 - 15.0 g/dL 13.4 13.1 13.0  Hematocrit 36.0 - 46.0 % 39.9 38.8 37.9  Platelets 150 - 400 K/uL 219 215 237   . CBC    Component Value Date/Time   WBC 7.7 02/02/2019 1305   WBC 6.6 01/06/2019 0757   RBC 4.32 02/02/2019 1305   HGB 13.4 02/02/2019 1305   HCT 39.9 02/02/2019 1305   PLT 219 02/02/2019 1305  MCV 92.4 02/02/2019 1305   MCH 31.0 02/02/2019 1305   MCHC 33.6 02/02/2019 1305   RDW 12.4 02/02/2019 1305   LYMPHSABS 1.3 02/02/2019 1305   MONOABS 0.9 02/02/2019 1305   EOSABS 0.1 02/02/2019 1305   BASOSABS 0.0 02/02/2019 1305    . CMP Latest Ref Rng & Units 02/02/2019 01/25/2019 01/18/2019  Glucose 70 - 99 mg/dL 216(H) 295(H) 305(H)  BUN 8 - 23 mg/dL _0 Creatinine 0.44 - 1.00 mg/dL 0.76 0.74 0.77  Sodium 135 - 145 mmol/L 136 135 139  Potassium 3.5 - 5.1 mmol/L 4.0 4.2 3.6  Chloride 98 - 111 mmol/L 103 104 105  CO2 22 - 32 mmol/L 21(L) 20(L) 21(L)    Calcium 8.9 - 10.3 mg/dL 10.4(H) 9.1 10.6(H)  Total Protein 6.5 - 8.1 g/dL 6.7 6.7 6.7  Total Bilirubin 0.3 - 1.2 mg/dL 0.5 0.6 0.5  Alkaline Phos 38 - 126 U/L 117 117 113  AST 15 - 41 U/L 63(H) 61(H) 53(H)  ALT 0 - 44 U/L 62(H) 56(H) 50(H)   Component     Latest Ref Rng & Units 12/27/2018  IgG (Immunoglobin G), Serum     700 - 1,600 mg/dL 801  IgA     64 - 422 mg/dL 91  IgM (Immunoglobulin M), Srm     26 - 217 mg/dL 15 (L)  Total Protein ELP     6.0 - 8.5 g/dL 6.6  Albumin SerPl Elph-Mcnc     2.9 - 4.4 g/dL 3.7  Alpha 1     0.0 - 0.4 g/dL 0.2  Alpha2 Glob SerPl Elph-Mcnc     0.4 - 1.0 g/dL 0.8  B-Globulin SerPl Elph-Mcnc     0.7 - 1.3 g/dL 1.2  Gamma Glob SerPl Elph-Mcnc     0.4 - 1.8 g/dL 0.7  M Protein SerPl Elph-Mcnc     Not Observed g/dL 0.5 (H)  Globulin, Total     2.2 - 3.9 g/dL 2.9  Albumin/Glob SerPl     0.7 - 1.7 1.3  IFE 1      Comment  Please Note (HCV):      Comment  Kappa free light chain     3.3 - 19.4 mg/L 5.1  Lamda free light chains     5.7 - 26.3 mg/L 40.3 (H)  Kappa, lamda light chain ratio     0.26 - 1.65 0.13 (L)  Beta-2 Microglobulin     0.6 - 2.4 mg/L 1.7  LDH     98 - 192 U/L 162  Sed Rate     0 - 22 mm/hr 8       01/06/19 Cytogenetics:       RADIOGRAPHIC STUDIES: I have personally reviewed the radiological images as listed and agreed with the findings in the report. Mr Liver W Wo Contrast  Result Date: 01/18/2019 CLINICAL DATA:  Multiple myeloma, hepatic activity heterogeneity on PET-CT for further investigation. EXAM: MRI ABDOMEN WITHOUT AND WITH CONTRAST TECHNIQUE: Multiplanar multisequence MR imaging of the abdomen was performed both before and after the administration of intravenous contrast. CONTRAST:  8 cc Gadavist COMPARISON:  01/05/2019 FINDINGS: Lower chest: Mild cardiomegaly. Hepatobiliary: Diffuse struck bout of signal in the liver compatible with hepatic steatosis. In segment 7 of the liver, a 1.2 by 1.1 cm T2  hyperintense lesion is observed without associated enhancement, compatible with a hepatic cyst. In the lateral segment left hepatic lobe, a 0.6 cm and separate 0.3 cm T2 hyperintense lesions are observed, both  shown on image 37/11, neither enhancing and compatible with cysts. Peripheral wedge-shaped 2.3 by 2.1 cm focus of initial hypoenhancement but subsequent delayed accentuated enhancement on late phase images was not previously hypermetabolic. This has mildly accentuated T2 signal characteristics and overall is thought to likely represent focal scarring., less likely atypical hemangioma. In the right hepatic lobe on image 24/1501 there is a small focus of arterial phase enhancement which appears very similar to the blush of enhancement shown on CT abdomen of 05/22/2011. This is fairly poorly seen on later phase images, with only some equivocal linear enhancement in the area, and could be from an unusual hemangioma/vascular malformation or more likely, focal nodular hyperplasia. No worrisome lesions to correlate with the heterogeneity seen on MRI. No biliary dilatation. Pancreas:  Unremarkable Spleen:  Unremarkable Adrenals/Urinary Tract: There are 2 tiny left renal cysts. Adrenal glands normal. Stomach/Bowel: Unremarkable Vascular/Lymphatic:  Aortoiliac atherosclerotic vascular disease. Other:  No supplemental non-categorized findings. Musculoskeletal: Scattered bony lesions are observed including a 2.8 by 1.8 cm lesion in the left side of the T9 vertebral body, corresponding to the patient's known myeloma/lytic lesions. IMPRESSION: 1. Several appreciable liver lesions all have benign imaging characteristics. No MRI findings of metastatic involvement of the liver. 2. Scattered bony lesions corresponding to the lytic lesions seen at PET-CT, compatible with active myeloma. 3.  Aortic Atherosclerosis (ICD10-I70.0).  Mild cardiomegaly. 4. Diffuse hepatic steatosis. Electronically Signed   By: Van Clines M.D.    On: 01/18/2019 09:16   Nm Pet Image Initial (pi) Whole Body  Result Date: 01/05/2019 CLINICAL DATA:  Initial treatment strategy for multiple myeloma. EXAM: NUCLEAR MEDICINE PET WHOLE BODY TECHNIQUE: 8.9 mCi F-18 FDG was injected intravenously. Full-ring PET imaging was performed from the skull base to thigh after the radiotracer. CT data was obtained and used for attenuation correction and anatomic localization. Fasting blood glucose: 162 mg/dl COMPARISON:  Bone scan of 12/13/2018 FINDINGS: Mediastinal blood pool activity: SUV max 2.9 HEAD/NECK: A lymph node measuring 1.2 cm in short axis in the left parapharyngeal space on image 47/4 has a maximum SUV 11.8. Incidental CT findings: none CHEST: No significant abnormal hypermetabolic activity in this region. Incidental CT findings: Mild mosaic attenuation particularly favoring the lower lobes. Old granulomatous disease. Aortic atherosclerosis. ABDOMEN/PELVIS: Accentuated heterogeneity of activity in the liver. One area of heterogeneity posteriorly in the right hepatic lobe has a maximum SUV of 5.1 as compared to the general background liver activity with maximum SUV of 4.1. A hypodense lesion posteriorly in the dome of the right hepatic lobe on image 105/4 is not discernibly hypermetabolic. Similarly there is some mild heterogeneity of the splenic activity. Physiologic activity throughout the bowel. Incidental CT findings: Old granulomatous disease involving the spleen. 4 mm nonobstructive right mid kidney calculus in the collecting system. Aortoiliac atherosclerotic vascular disease. Scattered sigmoid colon diverticula. SKELETON: Innumerable scattered small lytic lesions are present throughout the skeleton. Some of these are more confluent and measurably hypermetabolic, for example the larger lytic lesion of the left proximal humerus which has a maximum SUV of 8.1. There is a likely pathologic fracture of the right third rib with maximum SUV 3.7. Likely  pathologic fracture of the right fifth rib with maximum SUV 3.5. Likely pathologic fracture of the left ninth rib posterolaterally. A 2.8 by 1.9 cm lytic lesion of the left side of the T9 vertebral body has some cortical destruction on image 107/4, and a maximum SUV of 5.1. An index lesion posteriorly in the right proximal femoral  shaft measures 2.0 cm in diameter on image 216/4 with maximum SUV 4.0. Some of the lytic lesions, such as the 1.8 cm lytic lesion of the left inferior pubis and inferior pubic ramus shown on image 200/4, are not appreciably hypermetabolic. Incidental CT findings: Postoperative findings in the right ankle with K-wires along the malleoli. EXTREMITIES: No significant abnormal hypermetabolic activity in this region. Incidental CT findings: none IMPRESSION: 1. Innumerable lytic lesions in the skeleton compatible with myeloma. Most of the larger lesions are hypermetabolic, for example including a left proximal humeral shaft lesion with maximum SUV of 8.1 and a 2.8 cm lesion in the left T9 vertebral body with maximum SUV 5.1. Most of the smaller lytic lesions, and some of the larger lesions, do not demonstrate accentuated metabolic activity. 2. 1.2 cm in short axis lymph node in the left parapharyngeal space is hypermetabolic with maximum SUV 11.8. I do not see a separate mass in the head and neck to give rise to this hypermetabolic lymph node. 3. Mosaic attenuation in the lower lobes, nonspecific possibly from air trapping. 4.  Aortic Atherosclerosis (ICD10-I70.0). 5. Heterogeneous activity in the liver, making it hard to exclude small liver lesions. Consider hepatic protocol MRI with and without contrast for definitive assessment. Nonobstructive right nephrolithiasis. Old granulomatous disease. Electronically Signed   By: Van Clines M.D.   On: 01/05/2019 09:13   Ct Biopsy  Result Date: 01/06/2019 CLINICAL DATA:  Multiple lytic bone lesions with clinical suspicion of multiple  myeloma. Bone marrow biopsy required for further workup. EXAM: CT GUIDED BONE MARROW ASPIRATION AND BIOPSY ANESTHESIA/SEDATION: Versed 2.0 mg IV, Fentanyl 100 mcg IV Total Moderate Sedation Time:   11 minutes. The patient's level of consciousness and physiologic status were continuously monitored during the procedure by Radiology nursing. PROCEDURE: The procedure risks, benefits, and alternatives were explained to the patient. Questions regarding the procedure were encouraged and answered. The patient understands and consents to the procedure. A time out was performed prior to initiating the procedure. The right gluteal region was prepped with chlorhexidine. Sterile gown and sterile gloves were used for the procedure. Local anesthesia was provided with 1% Lidocaine. Under CT guidance, an 11 gauge On Control bone cutting needle was advanced from a posterior approach into the right iliac bone. Needle positioning was confirmed with CT. Initial non heparinized and heparinized aspirate samples were obtained of bone marrow. Core biopsy was performed via the On Control drill needle. COMPLICATIONS: None FINDINGS: Inspection of initial aspirate did reveal visible particles. Intact core biopsy sample was obtained. IMPRESSION: CT guided bone marrow biopsy of right posterior iliac bone with both aspirate and core samples obtained. Electronically Signed   By: Aletta Edouard M.D.   On: 01/06/2019 14:45   Ct Bone Marrow Biopsy & Aspiration  Result Date: 01/06/2019 CLINICAL DATA:  Multiple lytic bone lesions with clinical suspicion of multiple myeloma. Bone marrow biopsy required for further workup. EXAM: CT GUIDED BONE MARROW ASPIRATION AND BIOPSY ANESTHESIA/SEDATION: Versed 2.0 mg IV, Fentanyl 100 mcg IV Total Moderate Sedation Time:   11 minutes. The patient's level of consciousness and physiologic status were continuously monitored during the procedure by Radiology nursing. PROCEDURE: The procedure risks, benefits, and  alternatives were explained to the patient. Questions regarding the procedure were encouraged and answered. The patient understands and consents to the procedure. A time out was performed prior to initiating the procedure. The right gluteal region was prepped with chlorhexidine. Sterile gown and sterile gloves were used for the procedure. Local  anesthesia was provided with 1% Lidocaine. Under CT guidance, an 11 gauge On Control bone cutting needle was advanced from a posterior approach into the right iliac bone. Needle positioning was confirmed with CT. Initial non heparinized and heparinized aspirate samples were obtained of bone marrow. Core biopsy was performed via the On Control drill needle. COMPLICATIONS: None FINDINGS: Inspection of initial aspirate did reveal visible particles. Intact core biopsy sample was obtained. IMPRESSION: CT guided bone marrow biopsy of right posterior iliac bone with both aspirate and core samples obtained. Electronically Signed   By: Aletta Edouard M.D.   On: 01/06/2019 14:45    ASSESSMENT & PLAN:   78 y.o. female with  1. Newly diagnosed Multiple Myeloma, RISS Stage III  Labs upon initial presentation from 12/08/18, blood counts are normal including WBC at 7.1k, HGB at 13.1, and PLT at 245k. Calcium normal at 10.3. Creatinine normal at 0.63. M spike at 0.5g. 12/13/18 Bone Scan revealed Multifocal uptake throughout the skeleton, consistent with diffuse metastatic disease. Primary tumor is not specified. 2. Uptake in the proximal right femur, consistent with lytic lesions. 3. Uptake in the ribs bilaterally as described. 4. Lesions in the proximal left humerus. 5. Diffuse uptake throughout the skull consistent with metastatic disease. 6. Right paramedian uptake at the manubrium.  12/13/18 CT Right Femur revealed Numerous lytic lesions involving the right femur and a lytic lesion in the left inferior pubic ramus. Overall appearance is most concerning for multiple  myeloma  12/27/18 Pretreatment 24hour UPEP observed an M spike at '18mg'$ , and showed '199mg'$  total protein/day.  12/27/18 Pretreatment MMP revealed M Protein at 0.5g with IgG Lambda specificity. Kappa:Lambda light chain ratio at 0.13, with Lambda at 40.3. 01/05/19 PET/CT revealed Innumerable lytic lesions in the skeleton compatible with myeloma. Most of the larger lesions are hypermetabolic, for example including a left proximal humeral shaft lesion with maximum SUV of 8.1 and a 2.8 cm lesion in the left T9 vertebral body with maximum SUV 5.1. Most of the smaller lytic lesions, and some of the larger lesions, do not demonstrate accentuated metabolic activity. 2. 1.2 cm in short axis lymph node in the left parapharyngeal space is hypermetabolic with maximum SUV 11.8. I do not see a separate mass in the head and neck to give rise to this hypermetabolic lymph node. 3. Mosaic attenuation in the lower lobes, nonspecific possibly from air trapping. 4.  Aortic Atherosclerosis 5. Heterogeneous activity in the liver, making it hard to exclude small liver lesions. Consider hepatic protocol MRI with and without contrast for definitive assessment. Nonobstructive right nephrolithiasis. Old granulomatous disease  01/06/19 Bone Marrow biopsy revealed interstitial increase in plasma cells (28% aspirate, 40% CD138 immunohistochemistry). Plasma cells negative for light chains consistent with a non or weakly secretory myeloma   01/06/19 Cytogenetics revealed 37% of cells with trisomy 11 or 11q deletion, and 40.5% of cells with 17p mutation  2. 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  01/17/19 MRI Liver revealed Several appreciable liver lesions all have benign imaging characteristics. No MRI findings of metastatic involvement of the liver. 2. Scattered bony lesions corresponding to the lytic lesions seen at PET-CT, compatible with active  myeloma. 3. Aortic Atherosclerosis.  Mild cardiomegaly. 4. Diffuse hepatic steatosis.   PLAN -Discussed pt labwork today, 02/02/19; blood counts normal, calcium higher at 10.4 -Discussed the option to have a port placed ,which the pt prefers. Will refer her to IR for port  placement. -The pt has no prohibitive toxicities from continuing C1D1 Carfilzomib, Revlimid, and Dexamethasone at this time. -Continue follow up with PCP Dr. Pricilla Holm for management and evaluation of her DM, especially while on treatment with steroids as blood sugars will be higher -Previously discussed that there is less abnormal protein and light chains than I would expect from 30% plasma cells, which suggests hypo-secretory or non-secretory neoplastic plasma cells. Will have an impact in assessing response. -Continue Zometa -Continue Acyclovir and 68m Aspirin to mitigate risk of Shingles and blood clots respectively -Will watch lymph node in left parapharyngeal space seen on PET/CT -Follow up with [facility] for possible prophylactic interventions to mitigate risk of pathologic fractures -Avoid lifting, pulling, pushing with left upper extremity  -Avoid high impact activities and stairs to preserve right hip as well -May be a role for local radiation therapy for pain control at T9 and left shoulder -Will begin Fentanyl patch and have increased to 160mOxycodone for break through pain -Zofran for opiate-related nausea -Miralax once a day, and Senna S two tablets BID, back off as constipation resolves -Will see the pt back in 2 weeks   RTC with Dr KaIrene Limbon [place]/04/2019) Labs on D1,D8 and D15 of each cycle IR referral for port a cath placement.   All of the patients questions were answered with apparent satisfaction. The patient knows to call the clinic with any problems, questions or concerns.  The total time spent in the appt was 25 minutes and more  than 50% was on counseling and direct patient cares.    GaSullivan LoneD MS AAHIVMS SCDomin[facility] HospitalTD[facility] Cent[facility]rematology/Oncology Physician CoVirginia [facility](Office):       [phone] cell):  [phone]):           [phone]/20/2020 2:26 PM  I, ScBaldwin Jamaicaam acting as a scribe for Dr. GaSullivan Lone  .I have reviewed the above documentation for accuracy and completeness, and I agree with the above. .GBrunetta GeneraD

## 2019-02-02 ENCOUNTER — Inpatient Hospital Stay: Payer: Medicare Other | Admitting: Hematology

## 2019-02-02 ENCOUNTER — Inpatient Hospital Stay: Payer: Medicare Other

## 2019-02-02 VITALS — BP 137/70 | HR 91 | Temp 98.0°F | Resp 18 | Ht 59.0 in | Wt 178.5 lb

## 2019-02-02 DIAGNOSIS — C9000 Multiple myeloma not having achieved remission: Secondary | ICD-10-CM | POA: Diagnosis not present

## 2019-02-02 DIAGNOSIS — Z5111 Encounter for antineoplastic chemotherapy: Secondary | ICD-10-CM

## 2019-02-02 DIAGNOSIS — Z7982 Long term (current) use of aspirin: Secondary | ICD-10-CM

## 2019-02-02 DIAGNOSIS — Z5112 Encounter for antineoplastic immunotherapy: Secondary | ICD-10-CM | POA: Diagnosis not present

## 2019-02-02 DIAGNOSIS — G893 Neoplasm related pain (acute) (chronic): Secondary | ICD-10-CM

## 2019-02-02 DIAGNOSIS — E119 Type 2 diabetes mellitus without complications: Secondary | ICD-10-CM

## 2019-02-02 DIAGNOSIS — Z7189 Other specified counseling: Secondary | ICD-10-CM

## 2019-02-02 LAB — CMP (CANCER CENTER ONLY)
ALK PHOS: 117 U/L (ref 38–126)
ALT: 62 U/L — ABNORMAL HIGH (ref 0–44)
AST: 63 U/L — ABNORMAL HIGH (ref 15–41)
Albumin: 3.7 g/dL (ref 3.5–5.0)
Anion gap: 12 (ref 5–15)
BILIRUBIN TOTAL: 0.5 mg/dL (ref 0.3–1.2)
BUN: 18 mg/dL (ref 8–23)
CO2: 21 mmol/L — ABNORMAL LOW (ref 22–32)
Calcium: 10.4 mg/dL — ABNORMAL HIGH (ref 8.9–10.3)
Chloride: 103 mmol/L (ref 98–111)
Creatinine: 0.76 mg/dL (ref 0.44–1.00)
GFR, Est AFR Am: >60 mL/min (ref >60)
GFR, Estimated: >60 mL/min (ref >60)
Glucose, Bld: 216 mg/dL — ABNORMAL HIGH (ref 70–99)
Potassium: 4 mmol/L (ref 3.5–5.1)
Sodium: 136 mmol/L (ref 135–145)
TOTAL PROTEIN: 6.7 g/dL (ref 6.5–8.1)

## 2019-02-02 LAB — CBC WITH DIFFERENTIAL (CANCER CENTER ONLY)
Abs Immature Granulocytes: 0.03 10*3/uL (ref 0.00–0.07)
Basophils Absolute: 0 10*3/uL (ref 0.0–0.1)
Basophils Relative: 0 %
EOS PCT: 1 %
Eosinophils Absolute: 0.1 10*3/uL (ref 0.0–0.5)
HCT: 39.9 % (ref 36.0–46.0)
HEMOGLOBIN: 13.4 g/dL (ref 12.0–15.0)
Immature Granulocytes: 0 %
LYMPHS ABS: 1.3 10*3/uL (ref 0.7–4.0)
Lymphocytes Relative: 17 %
MCH: 31 pg (ref 26.0–34.0)
MCHC: 33.6 g/dL (ref 30.0–36.0)
MCV: 92.4 fL (ref 80.0–100.0)
Monocytes Absolute: 0.9 10*3/uL (ref 0.1–1.0)
Monocytes Relative: 11 %
Neutro Abs: 5.4 10*3/uL (ref 1.7–7.7)
Neutrophils Relative %: 71 %
Platelet Count: 219 10*3/uL (ref 150–400)
RBC: 4.32 MIL/uL (ref 3.87–5.11)
RDW: 12.4 % (ref 11.5–15.5)
WBC Count: 7.7 10*3/uL (ref 4.0–10.5)
nRBC: 0 % (ref 0.0–0.2)

## 2019-02-02 MED ORDER — DEXTROSE 5 % IV SOLN
20.0000 mg/m2 | Freq: Once | INTRAVENOUS | Status: AC
  Administered 2019-02-02: 36 mg via INTRAVENOUS
  Filled 2019-02-02: qty 15

## 2019-02-02 MED ORDER — PROCHLORPERAZINE MALEATE 10 MG PO TABS
10.0000 mg | ORAL_TABLET | Freq: Once | ORAL | Status: AC
  Administered 2019-02-02: 10 mg via ORAL

## 2019-02-02 MED ORDER — SODIUM CHLORIDE 0.9 % IV SOLN
Freq: Once | INTRAVENOUS | Status: AC
  Filled 2019-02-02: qty 250

## 2019-02-02 MED ORDER — PROCHLORPERAZINE MALEATE 10 MG PO TABS
ORAL_TABLET | ORAL | Status: AC
  Filled 2019-02-02: qty 1

## 2019-02-02 MED ORDER — SODIUM CHLORIDE 0.9 % IV SOLN
Freq: Once | INTRAVENOUS | Status: AC
  Administered 2019-02-02: 15:00:00 via INTRAVENOUS
  Filled 2019-02-02: qty 250

## 2019-02-02 MED ORDER — FENTANYL 12 MCG/HR TD PT72: 1.0000 | MEDICATED_PATCH | TRANSDERMAL | 0 refills | Status: DC

## 2019-02-02 NOTE — Patient Instructions (Signed)
[facility] Discharge Instructions for Patients Receiving Chemotherapy  Today you received the following chemotherapy agents: Carfilzomib (Kyprolis)  To help prevent nausea and vomiting after your treatment, we encourage you to take your nausea medication as directed.    If you develop nausea and vomiting that is not controlled by your nausea medication, call the clinic.   BELOW ARE SYMPTOMS THAT SHOULD BE REPORTED IMMEDIATELY:  *FEVER GREATER THAN 100.5 F  *CHILLS WITH OR WITHOUT FEVER  NAUSEA AND VOMITING THAT IS NOT CONTROLLED WITH YOUR NAUSEA MEDICATION  *UNUSUAL SHORTNESS OF BREATH  *UNUSUAL BRUISING OR BLEEDING  TENDERNESS IN MOUTH AND THROAT WITH OR WITHOUT PRESENCE OF ULCERS  *URINARY PROBLEMS  *BOWEL PROBLEMS  UNUSUAL RASH Items with * indicate a potential emergency and should be followed up as soon as possible.  Feel free to call the clinic should you have any questions or concerns. The clinic phone number is [phone].  Please show the [place] at check-in to the Emergency Department and triage nurse.  Carfilzomib injection What is this medicine? CARFILZOMIB (kar FILZ oh mib) targets a specific protein within cancer cells and stops the cancer cells from growing. It is used to treat multiple myeloma. This medicine may be used for other purposes; ask your health care provider or pharmacist if you have questions. COMMON BRAND NAME(S): KYPROLIS What should I tell my health care provider before I take this medicine? They need to know if you have any of these conditions: -heart disease -history of blood clots -irregular heartbeat -kidney disease -liver disease -lung or breathing disease -an unusual or allergic reaction to carfilzomib, or other medicines, foods, dyes, or preservatives -pregnant or trying to get pregnant -breast-feeding How should I use this medicine? This medicine is for injection or infusion into a vein. It is given  by a health care professional in a hospital or clinic setting. Talk to your pediatrician regarding the use of this medicine in children. Special care may be needed. Overdosage: If you think you have taken too much of this medicine contact a poison control center or emergency room at once. NOTE: This medicine is only for you. Do not share this medicine with others. What if I miss a dose? It is important not to miss your dose. Call your doctor or health care professional if you are unable to keep an appointment. What may interact with this medicine? Interactions are not expected. Give your health care provider a list of all the medicines, herbs, non-prescription drugs, or dietary supplements you use. Also tell them if you smoke, drink alcohol, or use illegal drugs. Some items may interact with your medicine. This list may not describe all possible interactions. Give your health care provider a list of all the medicines, herbs, non-prescription drugs, or dietary supplements you use. Also tell them if you smoke, drink alcohol, or use illegal drugs. Some items may interact with your medicine. What should I watch for while using this medicine? Your condition will be monitored carefully while you are receiving this medicine. Report any side effects. Continue your course of treatment even though you feel ill unless your doctor tells you to stop. You may need blood work done while you are taking this medicine. Do not become pregnant while taking this medicine or for at least 6 months after stopping it. Women should inform their doctor if they wish to become pregnant or think they might be pregnant. There is a potential for serious side effects to an  unborn child. Men should not father a child while taking this medicine and for at least 3 months after stopping it. Talk to your health care professional or pharmacist for more information. Do not breast-feed an infant while taking this medicine or for 2 weeks after  the last dose. Check with your doctor or health care professional if you get an attack of severe diarrhea, nausea and vomiting, or if you sweat a lot. The loss of too much body fluid can make it dangerous for you to take this medicine. You may get dizzy. Do not drive, use machinery, or do anything that needs mental alertness until you know how this medicine affects you. Do not stand or sit up quickly, especially if you are an older patient. This reduces the risk of dizzy or fainting spells. What side effects may I notice from receiving this medicine? Side effects that you should report to your doctor or health care professional as soon as possible: -allergic reactions like skin rash, itching or hives, swelling of the face, lips, or tongue -confusion -dizziness -feeling faint or lightheaded -fever or chills -palpitations -seizures -signs and symptoms of bleeding such as bloody or black, tarry stools; red or dark-brown urine; spitting up blood or brown material that looks like coffee grounds; red spots on the skin; unusual bruising or bleeding including from the eye, gums, or nose -signs and symptoms of a blood clot such as breathing problems; changes in vision; chest pain; severe, sudden headache; pain, swelling, warmth in the leg; trouble speaking; sudden numbness or weakness of the face, arm or leg -signs and symptoms of kidney injury like trouble passing urine or change in the amount of urine -signs and symptoms of liver injury like dark yellow or brown urine; general ill feeling or flu-like symptoms; light-colored stools; loss of appetite; nausea; right upper belly pain; unusually weak or tired; yellowing of the eyes or skin Side effects that usually do not require medical attention (report to your doctor or health care professional if they continue or are bothersome): -back pain -cough -diarrhea -headache -muscle cramps -vomiting This list may not describe all possible side effects. Call  your doctor for medical advice about side effects. You may report side effects to FDA at 1-800-FDA-1088. Where should I keep my medicine? This drug is given in a hospital or clinic and will not be stored at home. NOTE: This sheet is a summary. It may not cover all possible information. If you have questions about this medicine, talk to your doctor, pharmacist, or health care provider.  2019 Elsevier/Gold Standard (2017-09-15 14:07:13)

## 2019-02-02 NOTE — Telephone Encounter (Signed)
Oral Chemotherapy Pharmacist Encounter   I spoke with patient in infusion room for overview of: Revlimid (lenalidomide) for the treatment of multiple myeloma not having achieved remission, in conjunction with Kyprolis and dexamethasone, planned duration 4-6 cycles and then reassessment.   Counseled patient on administration, dosing, side effects, monitoring, drug-food interactions, safe handling, storage, and disposal.  Patient will take Revlimid 72m capsules, 1 capsule by mouth once daily, without regard to food, with a full glass of water.  Revlimid will be given 21 days on, 7 days off, repeat every 28 days.  Revlimid start date: TBD, pending medication acquisition  Adverse effects of Revlimid include but are not limited to: nausea, constipation, diarrhea, abdominal pain, rash, fatigue, drug fever, and decreased blood counts.    Reviewed with patient importance of keeping a medication schedule and plan for any missed doses.  Medication reconciliation performed and medication/allergy list updated.  Patient confirms that she remains on acyclovir 400 mg by mouth twice daily for VZV prophylaxis. Patient also confirms that she remains on aspirin 81 mg by mouth once daily for thromboprophylaxis. Importance of both of the supportive therapy medications reinforced with patient.  REMS program paperwork signed by patient while in infusion room. This paperwork was delivered to collaborative practice RN who will fax to [place] to obtain a [employer] authorization number for Revlimid prescription.  Once [employer] authorization number is obtained, Revlimid [facility].  I will fax supporting information such as demographics, insurance card, insurance authorization, and HXcel Energyinformation to dispensing pharmacy to speed up the process of patient receiving her medication.  Patient provided above information  and instructed to contact oral oncology clinic by next Wednesday (02/08/2019) if she has not yet heard from dispensing pharmacy.  [facility] RNorma Fredricksonfor continued evaluation of her multiple myeloma. We discussed process of possible stem cell transplant as well as other available options for the treatment of multiple myeloma.  All questions answered.  Ms. HRixvoiced understanding and appreciation.   Patient knows to call the office with questions or concerns. Oral Oncology Clinic will continue to follow.  JJohny Drilling PharmD, BCPS, BCOP  02/02/2019    3:53 PM Oral Oncology Clinic [phone]

## 2019-02-03 ENCOUNTER — Telehealth: Payer: Self-pay | Admitting: Hematology

## 2019-02-03 ENCOUNTER — Telehealth: Payer: Self-pay | Admitting: *Deleted

## 2019-02-03 ENCOUNTER — Inpatient Hospital Stay: Payer: Medicare Other

## 2019-02-03 VITALS — BP 116/64 | HR 80 | Temp 97.7°F | Resp 16

## 2019-02-03 DIAGNOSIS — C9000 Multiple myeloma not having achieved remission: Secondary | ICD-10-CM

## 2019-02-03 DIAGNOSIS — Z5112 Encounter for antineoplastic immunotherapy: Secondary | ICD-10-CM | POA: Diagnosis not present

## 2019-02-03 DIAGNOSIS — Z7189 Other specified counseling: Secondary | ICD-10-CM

## 2019-02-03 MED ORDER — PROCHLORPERAZINE MALEATE 10 MG PO TABS
10.0000 mg | ORAL_TABLET | Freq: Once | ORAL | Status: AC
  Administered 2019-02-03: 10 mg via ORAL

## 2019-02-03 MED ORDER — PROCHLORPERAZINE MALEATE 10 MG PO TABS
ORAL_TABLET | ORAL | Status: AC
  Filled 2019-02-03: qty 1

## 2019-02-03 MED ORDER — SODIUM CHLORIDE 0.9 % IV SOLN
Freq: Once | INTRAVENOUS | Status: AC
  Administered 2019-02-03: 14:00:00 via INTRAVENOUS
  Filled 2019-02-03: qty 250

## 2019-02-03 MED ORDER — DEXTROSE 5 % IV SOLN
20.0000 mg/m2 | Freq: Once | INTRAVENOUS | Status: AC
  Administered 2019-02-03: 36 mg via INTRAVENOUS
  Filled 2019-02-03: qty 5

## 2019-02-03 NOTE — Telephone Encounter (Signed)
Faxed completed Physician/Patient agreement to [facility] @ [phone]. Fax confirmation received.

## 2019-02-03 NOTE — Telephone Encounter (Signed)
Scheduled appt per 2/20 los.  

## 2019-02-03 NOTE — Patient Instructions (Signed)
[facility] Discharge Instructions for Patients Receiving Chemotherapy  Today you received the following chemotherapy agents:  Kyprolis  To help prevent nausea and vomiting after your treatment, we encourage you to take your nausea medication as prescribed.   If you develop nausea and vomiting that is not controlled by your nausea medication, call the clinic.   BELOW ARE SYMPTOMS THAT SHOULD BE REPORTED IMMEDIATELY:  *FEVER GREATER THAN 100.5 F  *CHILLS WITH OR WITHOUT FEVER  NAUSEA AND VOMITING THAT IS NOT CONTROLLED WITH YOUR NAUSEA MEDICATION  *UNUSUAL SHORTNESS OF BREATH  *UNUSUAL BRUISING OR BLEEDING  TENDERNESS IN MOUTH AND THROAT WITH OR WITHOUT PRESENCE OF ULCERS  *URINARY PROBLEMS  *BOWEL PROBLEMS  UNUSUAL RASH Items with * indicate a potential emergency and should be followed up as soon as possible.  Feel free to call the clinic should you have any questions or concerns. The clinic phone number is [phone].  Please show the CHEMO ALERT CARD at check-in to the Emergency Department and triage nurse.   

## 2019-02-04 ENCOUNTER — Observation Stay (HOSPITAL_COMMUNITY)
Admission: EM | Admit: 2019-02-04 | Discharge: 2019-02-05 | Disposition: A | Discharge status: Home / Self Care | Payer: Medicare Other | Attending: Internal Medicine | Admitting: Internal Medicine

## 2019-02-04 ENCOUNTER — Other Ambulatory Visit: Payer: Self-pay

## 2019-02-04 ENCOUNTER — Emergency Department (HOSPITAL_COMMUNITY): Payer: Medicare Other

## 2019-02-04 ENCOUNTER — Encounter (HOSPITAL_COMMUNITY): Payer: Self-pay

## 2019-02-04 DIAGNOSIS — Z882 Allergy status to sulfonamides status: Secondary | ICD-10-CM | POA: Diagnosis not present

## 2019-02-04 DIAGNOSIS — Z886 Allergy status to analgesic agent status: Secondary | ICD-10-CM | POA: Insufficient documentation

## 2019-02-04 DIAGNOSIS — J45909 Unspecified asthma, uncomplicated: Secondary | ICD-10-CM | POA: Insufficient documentation

## 2019-02-04 DIAGNOSIS — Z79899 Other long term (current) drug therapy: Secondary | ICD-10-CM | POA: Diagnosis not present

## 2019-02-04 DIAGNOSIS — F329 Major depressive disorder, single episode, unspecified: Secondary | ICD-10-CM | POA: Insufficient documentation

## 2019-02-04 DIAGNOSIS — Z7984 Long term (current) use of oral hypoglycemic drugs: Secondary | ICD-10-CM | POA: Insufficient documentation

## 2019-02-04 DIAGNOSIS — Z880 Allergy status to penicillin: Secondary | ICD-10-CM | POA: Diagnosis not present

## 2019-02-04 DIAGNOSIS — E119 Type 2 diabetes mellitus without complications: Secondary | ICD-10-CM

## 2019-02-04 DIAGNOSIS — R0902 Hypoxemia: Secondary | ICD-10-CM | POA: Diagnosis present

## 2019-02-04 DIAGNOSIS — M8580 Other specified disorders of bone density and structure, unspecified site: Secondary | ICD-10-CM | POA: Insufficient documentation

## 2019-02-04 DIAGNOSIS — R509 Fever, unspecified: Secondary | ICD-10-CM | POA: Diagnosis present

## 2019-02-04 DIAGNOSIS — Z7982 Long term (current) use of aspirin: Secondary | ICD-10-CM | POA: Insufficient documentation

## 2019-02-04 DIAGNOSIS — E785 Hyperlipidemia, unspecified: Secondary | ICD-10-CM | POA: Diagnosis not present

## 2019-02-04 DIAGNOSIS — C9000 Multiple myeloma not having achieved remission: Principal | ICD-10-CM | POA: Diagnosis present

## 2019-02-04 LAB — CBC WITH DIFFERENTIAL/PLATELET
Abs Immature Granulocytes: 0.08 10*3/uL — ABNORMAL HIGH (ref 0.00–0.07)
BASOS ABS: 0 10*3/uL (ref 0.0–0.1)
Basophils Relative: 0 %
Eosinophils Absolute: 0 10*3/uL (ref 0.0–0.5)
Eosinophils Relative: 1 %
HCT: 34.2 % — ABNORMAL LOW (ref 36.0–46.0)
Hemoglobin: 11.5 g/dL — ABNORMAL LOW (ref 12.0–15.0)
Immature Granulocytes: 2 %
Lymphocytes Relative: 12 %
Lymphs Abs: 0.6 10*3/uL — ABNORMAL LOW (ref 0.7–4.0)
MCH: 31.6 pg (ref 26.0–34.0)
MCHC: 33.6 g/dL (ref 30.0–36.0)
MCV: 94 fL (ref 80.0–100.0)
Monocytes Absolute: 1.6 10*3/uL — ABNORMAL HIGH (ref 0.1–1.0)
Monocytes Relative: 33 %
NEUTROS PCT: 52 %
Neutro Abs: 2.6 10*3/uL (ref 1.7–7.7)
Platelets: 144 10*3/uL — ABNORMAL LOW (ref 150–400)
RBC: 3.64 MIL/uL — AB (ref 3.87–5.11)
RDW: 12.6 % (ref 11.5–15.5)
WBC: 4.8 10*3/uL (ref 4.0–10.5)
nRBC: 0.4 % — ABNORMAL HIGH (ref 0.0–0.2)

## 2019-02-04 MED ORDER — ACETAMINOPHEN 325 MG PO TABS
650.0000 mg | ORAL_TABLET | Freq: Once | ORAL | Status: AC
  Administered 2019-02-04: 650 mg via ORAL
  Filled 2019-02-04: qty 2

## 2019-02-04 MED ORDER — SODIUM CHLORIDE 0.9 % IV BOLUS
1000.0000 mL | Freq: Once | INTRAVENOUS | Status: AC
  Administered 2019-02-04: 1000 mL via INTRAVENOUS

## 2019-02-04 MED ORDER — ONDANSETRON HCL 4 MG/2ML IJ SOLN
4.0000 mg | Freq: Once | INTRAMUSCULAR | Status: AC
  Administered 2019-02-04: 4 mg via INTRAVENOUS
  Filled 2019-02-04: qty 2

## 2019-02-04 NOTE — ED Triage Notes (Signed)
Pt reports chills and fever of 100.9. She had chemo yesterday. Denies pain.

## 2019-02-04 NOTE — ED Provider Notes (Signed)
[facility] DEPT Provider Note   CSN: [id number] Arrival date & time: 02/04/19  2216    History   Chief Complaint Chief Complaint  Patient presents with  . Fever    Cancer Patient    HPI GAYNELLE PASTRANA is a 78 y.o. female.     The history is provided by the patient and medical records.    78 y.o. F with hx of MM stage III, asthma, depression, DM, HLP, obesity, osteopenia, presenting to the ED for fever that developed this evening.  Patient currently on treatment for Kyprolis , Revlimid, and Dexamethasone at this time.  Had infusion of Kyprolis yesterday, this was her first infusion of the cycle.  When returning home she felt a little weak and tired which is to be expected.  States today she has been nauseated but no vomiting, she has been drinking lots of water like she was told but did not really want to eat dinner.  Son noticed that she appeared somewhat flushed and they checked 100.40F.  She has not had any Tylenol or Motrin thus far.  Prior to yesterday she was feeling very well.  She has not had any new cough, sore throat, nasal congestion, or other upper respiratory symptoms.  She is not had any urinary symptoms.  No diarrhea.  No chest pain or shortness of breath.  No abdominal pain.  Past Medical History:  Diagnosis Date  . Allergy    seasonal  . Asthma   . DEPRESSION   . DIABETES MELLITUS, TYPE II   . Diverticulosis   . HYPERLIPIDEMIA   . Macular degeneration of left eye    mild, Dr.Hecker  . Obesity, unspecified   . Osteoarthritis of both knees   . OSTEOPENIA   . Osteopenia   . URINARY INCONTINENCE     Patient Active Problem List   Diagnosis Date Noted  . Multiple myeloma ([place]) 01/16/2019  . Counseling regarding advance care planning and goals of care 01/16/2019  . Acute bilateral low back pain with right-sided sciatica 12/06/2018  . Fatigue 06/07/2018  . Elevated LFTs 06/07/2018  . Arthritis of carpometacarpal [facility]) joint of left  thumb 08/02/2017  . Degenerative arthritis of knee, bilateral 03/09/2017  . Lumbar degenerative disc disease 03/09/2017  . Routine general medical examination at a health care facility 10/23/2015  . Snoring   . Allergic rhinitis 03/28/2013  . Reactive airway disease   . Mild major depression ([place]) 04/20/2010  . Osteopenia 04/20/2010  . Diabetes type 2, controlled ([place]) 04/18/2010  . Hyperlipidemia associated with type 2 diabetes mellitus ([place]) 04/18/2010  . Morbid obesity ([place]) 04/18/2010    Past Surgical History:  Procedure Laterality Date  . BREAST SURGERY    . CATARACT EXTRACTION Left 05/24/2018  . CESAREAN SECTION  01/1973  . FRACTURE SURGERY    . left wrist surgery  2008   By Dr. Latanya Maudlin  . right ankle  1994     OB History    Gravida  1   Para  1   Term      Preterm      AB      Living  1     SAB      TAB      Ectopic      Multiple      Live Births               Home Medications    Prior to Admission medications  Medication Sig Start Date End Date Taking? Authorizing Provider  acyclovir (ZOVIRAX) 400 MG tablet Take 1 tablet (400 mg total) by mouth 2 (two) times daily. 01/16/19   Brunetta Genera, MD  albuterol (PROVENTIL HFA;VENTOLIN HFA) 108 (90 Base) MCG/ACT inhaler INHALE 1 TO 2 PUFFS INTO THE LUNGS EVERY 6 HOURS AS NEEDED FOR WHEEZING OR SHORTNESS OF BREATH Patient not taking: Reported on 01/25/2019 03/09/18   Hoyt Koch, MD  aspirin EC 81 MG tablet Take 81 mg by mouth daily.    [provider]  Blood Glucose Monitoring Suppl (FREESTYLE FREEDOM LITE) W/DEVICE KIT Use to check blood sugars twice a day Dx 250.00 06/01/14   Rowe Clack, MD  Calcium Carbonate-Vitamin D (CALCIUM 600+D HIGH POTENCY) 600-400 MG-UNIT per tablet Take 1 tablet by mouth 2 (two) times daily.     [provider]  dexamethasone (DECADRON) 4 MG tablet Take 5 tablets (20 mg total) by mouth once a week. On D1,8 and 15 of each cycle of  treatment 01/16/19   Brunetta Genera, MD  doxycycline (PERIOSTAT) 20 MG tablet Take 1 tablet by mouth as needed. 03/09/18   [provider]  fentaNYL (DURAGESIC) 12 MCG/HR Place 1 patch onto the skin every 3 (three) days. 02/02/19   Brunetta Genera, MD  fluticasone (FLONASE) 50 MCG/ACT nasal spray Place 1 spray into both nostrils daily. Patient not taking: Reported on 01/10/2019 03/09/18   Hoyt Koch, MD  glucose blood (FREESTYLE LITE) test strip CHECK BLOOD SUGAR TWICE DAILY AS DIRECTED Dx 250.00 07/13/14   Rowe Clack, MD  ibuprofen (ADVIL) 200 MG tablet Take 600 mg by mouth 4 (four) times daily.     [provider]  ketorolac (ACULAR) 0.4 % SOLN 1 drop 4 (four) times daily.    [provider]  Lancets (FREESTYLE) lancets Use twice daily to check sugars. 04/15/16   Hoyt Koch, MD  lenalidomide (REVLIMID) 15 MG capsule Take 1 capsule (15 mg total) by mouth daily. 01/25/19   Brunetta Genera, MD  metFORMIN (GLUCOPHAGE-XR) 500 MG 24 hr tablet Take 3 tablets (1,500 mg total) by mouth daily with breakfast. 06/06/18   Hoyt Koch, MD  montelukast (SINGULAIR) 10 MG tablet TAKE 1 TABLET BY MOUTH DAILY AS NEEDED Patient not taking: Reported on 01/10/2019 05/11/18   Hoyt Koch, MD  Multiple Vitamins-Minerals (ICAPS) CAPS Take 1 capsule by mouth daily.      [provider]  ondansetron (ZOFRAN) 8 MG tablet Take 1 tablet (8 mg total) by mouth 2 (two) times daily as needed (Nausea or vomiting). 01/16/19   Brunetta Genera, MD  oxyCODONE 10 MG TABS Take 1 tablet (10 mg total) by mouth every 6 (six) hours as needed for moderate pain or severe pain. 01/31/19   Brunetta Genera, MD  pantoprazole (PROTONIX) 20 MG tablet TAKE 1 TABLET(20 MG) BY MOUTH DAILY Patient not taking: Reported on 01/25/2019 11/19/17   Hoyt Koch, MD  polyethylene glycol [facility] Floria Raveling) packet Take 17 g by mouth daily as needed.     [provider]  predniSONE (DELTASONE) 20 MG tablet Take 2 tablets (40 mg total) by mouth daily with breakfast. Patient not taking: Reported on 12/27/2018 12/05/18   Hoyt Koch, MD  prochlorperazine (COMPAZINE) 10 MG tablet Take 1 tablet (10 mg total) by mouth every 6 (six) hours as needed (Nausea or vomiting). 01/16/19   Brunetta Genera, MD  senna-docusate (SENNA S) 8.6-50  MG tablet Take 2 tablets by mouth at bedtime. 01/10/19   Brunetta Genera, MD  sertraline (ZOLOFT) 50 MG tablet TAKE 1 TABLET BY MOUTH DAILY 11/01/18   Hoyt Koch, MD  simvastatin (ZOCOR) 20 MG tablet TAKE 1 TABLET(20 MG) BY MOUTH DAILY 11/01/18   Hoyt Koch, MD  Vitamin D, Ergocalciferol, (DRISDOL) 1.25 MG (50000 UT) CAPS capsule Take 1 capsule (50,000 Units total) by mouth every 7 (seven) days. 12/12/18   Hoyt Koch, MD    Family History Family History  Problem Relation Age of Onset  . Diabetes Father   . Hyperlipidemia Father   . Heart disease Father   . Cancer Father   . Hypertension Father   . Colon cancer Paternal Grandmother 90  . Osteoporosis Mother   . Protein S deficiency Mother   . Hyperlipidemia Mother   . Multiple sclerosis Daughter   . Cancer Other        bladder  . Breast cancer Neg Hx     Social History Social History   Tobacco Use  . Smoking status: Never Smoker  . Smokeless tobacco: Never Used  . Tobacco comment: Lives with partner [name]) and son  Substance Use Topics  . Alcohol use: No    Alcohol/week: 0.0 standard drinks  . Drug use: No     Allergies   Penicillins; Aleve [naproxen sodium]; and Sulfonamide derivatives   Review of Systems Review of Systems  Constitutional: Positive for fever.  All other systems reviewed and are negative.    Physical Exam Updated Vital Signs BP (!) 143/64 (BP Location: Left Arm)   Pulse (!) 104   Temp (!) 100.8 F (38.2 C) (Oral)   Resp 18   LMP 10/09/2012   SpO2 93%    Physical Exam Vitals signs and nursing note reviewed.  Constitutional:      Appearance: She is well-developed.     Comments: Appears flushed, warm to the touch  HENT:     Head: Normocephalic and atraumatic.     Right Ear: Tympanic membrane and ear canal normal.     Left Ear: Tympanic membrane and ear canal normal.     Nose: Nose normal.     Mouth/Throat:     Lips: Pink.     Mouth: Mucous membranes are moist.     Pharynx: Oropharynx is clear.  Eyes:     Conjunctiva/sclera: Conjunctivae normal.     Pupils: Pupils are equal, round, and reactive to light.  Neck:     Musculoskeletal: Normal range of motion.  Cardiovascular:     Rate and Rhythm: Normal rate and regular rhythm.     Heart sounds: Normal heart sounds.  Pulmonary:     Effort: Pulmonary effort is normal.     Breath sounds: No decreased breath sounds, wheezing or rhonchi.     Comments: Dry cough witnessed during exam, lungs clear Abdominal:     General: Bowel sounds are normal.     Palpations: Abdomen is soft.  Musculoskeletal: Normal range of motion.  Skin:    General: Skin is warm and dry.  Neurological:     Mental Status: She is alert and oriented to person, place, and time.      ED Treatments / Results  Labs (all labs ordered are listed, but only abnormal results are displayed) Labs Reviewed  CBC WITH DIFFERENTIAL/PLATELET - Abnormal; Notable for the following components:      Result Value   RBC 3.64 (*)  Hemoglobin 11.5 (*)    HCT 34.2 (*)    Platelets 144 (*)    nRBC 0.4 (*)    Lymphs Abs 0.6 (*)    Monocytes Absolute 1.6 (*)    Abs Immature Granulocytes 0.08 (*)    All other components within normal limits  COMPREHENSIVE METABOLIC PANEL - Abnormal; Notable for the following components:   Sodium 130 (*)    Glucose, Bld 206 (*)    Total Protein 6.2 (*)    AST 64 (*)    ALT 61 (*)    All other components within normal limits  URINE CULTURE  CULTURE, BLOOD (ROUTINE X 2)  CULTURE, BLOOD  (ROUTINE X 2)  LACTIC ACID, PLASMA  INFLUENZA PANEL BY PCR (TYPE A & B)  URINALYSIS, ROUTINE W REFLEX MICROSCOPIC  CBC  BASIC METABOLIC PANEL    EKG None  Radiology Dg Chest 2 View  Result Date: 02/04/2019 CLINICAL DATA:  Fever, chills EXAM: CHEST - 2 VIEW COMPARISON:  10/14/2015 FINDINGS: Heart is borderline in size. Linear atelectasis or scarring in the lingula. No confluent airspace opacities or effusions. No acute bony abnormality. IMPRESSION: Lingular atelectasis or scarring. Borderline heart size. No active disease. Electronically Signed   By: Rolm Baptise M.D.   On: 02/04/2019 23:25    Procedures Procedures (including critical care time)  Medications Ordered in ED Medications  vancomycin (VANCOCIN) 2,000 mg in sodium chloride 0.9 % 500 mL IVPB (2,000 mg Intravenous New Bag/Given 02/05/19 0249)  acyclovir (ZOVIRAX) tablet 400 mg (has no administration in time range)  albuterol (PROVENTIL HFA;VENTOLIN HFA) 108 (90 Base) MCG/ACT inhaler 1-2 puff (has no administration in time range)  aspirin EC tablet 81 mg (has no administration in time range)  Calcium Carbonate-Vitamin D 600-400 MG-UNIT 1 tablet (has no administration in time range)  fentaNYL (DURAGESIC) 12 MCG/HR 1 patch (has no administration in time range)  fluticasone (FLONASE) 50 MCG/ACT nasal spray 1 spray (has no administration in time range)  Oxycodone HCl TABS 10 mg (has no administration in time range)  prochlorperazine (COMPAZINE) tablet 10 mg (has no administration in time range)  simvastatin (ZOCOR) tablet 20 mg (has no administration in time range)  sertraline (ZOLOFT) tablet 50 mg (has no administration in time range)  senna-docusate (Senokot-S) tablet 2 tablet (has no administration in time range)  ICAPS CAPS 1 capsule (has no administration in time range)  ibuprofen (ADVIL,MOTRIN) tablet 600 mg (has no administration in time range)  acetaminophen (TYLENOL) tablet 650 mg (has no administration in time range)     Or  acetaminophen (TYLENOL) suppository 650 mg (has no administration in time range)  ondansetron (ZOFRAN) tablet 4 mg (has no administration in time range)    Or  ondansetron (ZOFRAN) injection 4 mg (has no administration in time range)  enoxaparin (LOVENOX) injection 40 mg (has no administration in time range)  insulin aspart (novoLOG) injection 0-15 Units (has no administration in time range)  acetaminophen (TYLENOL) tablet 650 mg (650 mg Oral Given 02/04/19 2353)  sodium chloride 0.9 % bolus 1,000 mL (0 mLs Intravenous Stopped 02/05/19 0007)  ondansetron (ZOFRAN) injection 4 mg (4 mg Intravenous Given 02/04/19 2353)  ceFEPIme (MAXIPIME) 2 g in sodium chloride 0.9 % 100 mL IVPB (0 g Intravenous Stopped 02/05/19 0225)     Initial Impression / Assessment and Plan / ED Course  I have reviewed the triage vital signs and the nursing notes.  Pertinent labs & imaging results that were available during my care of the  patient were reviewed by me and considered in my medical decision making (see chart for details).  78 year old female presenting to the ED with fever.  She has multiple myeloma and started new cycle of chemotherapy yesterday.  Has had some nausea but denies any other symptoms.  She denies any URI symptoms, no urinary symptoms.  She is febrile here but overall nontoxic in appearance.  Lungs are clear without any wheezes or rhonchi, but I do observe a dry cough during exam.  Plan for sepsis work-up including lactate, blood and urine cultures, screening labs.  Will also obtain flu panel and chest x-ray.  Given IVF and zofran, tylenol for fever.  Patient's initial labs are overall reassuring.  Her white blood cell count is 4.8, however may actually be lower than this in reality as she is on Decadron.  Lactate is normal.  No significant electrolyte imbalance.  Chest x-ray with lingular atelectasis versus scarring.  Flu and urinalysis is pending.  Fever has improved with Motrin, down to  99.9F.  12:54 AM Patient now hypoxic down to 88% on RA.  Patient started on 2L via [place] with improvement.  Patient does not generally require O2.  Given this along with fever, suspect possible PNA not yet visible on CXR vs viral etiology.  Given her immunocompromised state, will start abx.  Flu swab pending.  Flu swab is negative for a and B.  Blood cultures are pending.  Case has been discussed with hospitalist, Dr. Alcario Drought-- he will admit for ongoing care.  Final Clinical Impressions(s) / ED Diagnoses   Final diagnoses:  Fever, unspecified fever cause  Multiple myeloma not having achieved remission [facility])    ED Discharge Orders    None       Larene Pickett, PA-C 02/05/19 9009    Duffy Bruce, MD 02/08/19 0000

## 2019-02-05 DIAGNOSIS — R0902 Hypoxemia: Secondary | ICD-10-CM | POA: Diagnosis not present

## 2019-02-05 DIAGNOSIS — R509 Fever, unspecified: Secondary | ICD-10-CM

## 2019-02-05 DIAGNOSIS — E118 Type 2 diabetes mellitus with unspecified complications: Secondary | ICD-10-CM | POA: Diagnosis not present

## 2019-02-05 DIAGNOSIS — C9000 Multiple myeloma not having achieved remission: Principal | ICD-10-CM

## 2019-02-05 LAB — CBC
HCT: 33.1 % — ABNORMAL LOW (ref 36.0–46.0)
Hemoglobin: 10.7 g/dL — ABNORMAL LOW (ref 12.0–15.0)
MCH: 31.3 pg (ref 26.0–34.0)
MCHC: 32.3 g/dL (ref 30.0–36.0)
MCV: 96.8 fL (ref 80.0–100.0)
NRBC: 0 % (ref 0.0–0.2)
Platelets: 136 10*3/uL — ABNORMAL LOW (ref 150–400)
RBC: 3.42 MIL/uL — ABNORMAL LOW (ref 3.87–5.11)
RDW: 12.6 % (ref 11.5–15.5)
WBC: 4.1 10*3/uL (ref 4.0–10.5)

## 2019-02-05 LAB — BASIC METABOLIC PANEL
Anion gap: 7 (ref 5–15)
BUN: 14 mg/dL (ref 8–23)
CO2: 21 mmol/L — ABNORMAL LOW (ref 22–32)
Calcium: 8.8 mg/dL — ABNORMAL LOW (ref 8.9–10.3)
Chloride: 110 mmol/L (ref 98–111)
Creatinine, Ser: 0.67 mg/dL (ref 0.44–1.00)
GFR calc Af Amer: >60 mL/min (ref >60)
GFR calc non Af Amer: >60 mL/min (ref >60)
Glucose, Bld: 242 mg/dL — ABNORMAL HIGH (ref 70–99)
Potassium: 4.1 mmol/L (ref 3.5–5.1)
Sodium: 138 mmol/L (ref 135–145)

## 2019-02-05 LAB — URINALYSIS, ROUTINE W REFLEX MICROSCOPIC
Bilirubin Urine: NEGATIVE
Glucose, UA: NEGATIVE mg/dL
Hgb urine dipstick: NEGATIVE
Ketones, ur: NEGATIVE mg/dL
Leukocytes,Ua: NEGATIVE
NITRITE: NEGATIVE
Protein, ur: NEGATIVE mg/dL
Specific Gravity, Urine: 1.004 — ABNORMAL LOW (ref 1.005–1.030)
pH: 6 (ref 5.0–8.0)

## 2019-02-05 LAB — COMPREHENSIVE METABOLIC PANEL
ALT: 61 U/L — ABNORMAL HIGH (ref 0–44)
AST: 64 U/L — ABNORMAL HIGH (ref 15–41)
Albumin: 3.8 g/dL (ref 3.5–5.0)
Alkaline Phosphatase: 97 U/L (ref 38–126)
Anion gap: 7 (ref 5–15)
BUN: 15 mg/dL (ref 8–23)
CHLORIDE: 101 mmol/L (ref 98–111)
CO2: 22 mmol/L (ref 22–32)
Calcium: 9.1 mg/dL (ref 8.9–10.3)
Creatinine, Ser: 0.6 mg/dL (ref 0.44–1.00)
GFR calc Af Amer: >60 mL/min (ref >60)
GFR calc non Af Amer: >60 mL/min (ref >60)
Glucose, Bld: 206 mg/dL — ABNORMAL HIGH (ref 70–99)
Potassium: 3.7 mmol/L (ref 3.5–5.1)
Sodium: 130 mmol/L — ABNORMAL LOW (ref 135–145)
Total Bilirubin: 0.6 mg/dL (ref 0.3–1.2)
Total Protein: 6.2 g/dL — ABNORMAL LOW (ref 6.5–8.1)

## 2019-02-05 LAB — GLUCOSE, CAPILLARY
Glucose-Capillary: 141 mg/dL — ABNORMAL HIGH (ref 70–99)
Glucose-Capillary: 237 mg/dL — ABNORMAL HIGH (ref 70–99)

## 2019-02-05 LAB — LACTIC ACID, PLASMA: Lactic Acid, Venous: 1.1 mmol/L (ref 0.5–1.9)

## 2019-02-05 LAB — INFLUENZA PANEL BY PCR (TYPE A & B)
INFLBPCR: NEGATIVE
Influenza A By PCR: NEGATIVE

## 2019-02-05 MED ORDER — PROSIGHT PO TABS
1.0000 | ORAL_TABLET | Freq: Every day | ORAL | Status: DC
  Filled 2019-02-05: qty 1

## 2019-02-05 MED ORDER — ALBUTEROL SULFATE (2.5 MG/3ML) 0.083% IN NEBU: 3.0000 mL | INHALATION_SOLUTION | Freq: Four times a day (QID) | RESPIRATORY_TRACT | Status: DC | PRN

## 2019-02-05 MED ORDER — FLUTICASONE PROPIONATE 50 MCG/ACT NA SUSP
1.0000 | Freq: Every day | NASAL | Status: DC
  Filled 2019-02-05: qty 16

## 2019-02-05 MED ORDER — ONDANSETRON HCL 4 MG PO TABS: 4.0000 mg | ORAL_TABLET | Freq: Four times a day (QID) | ORAL | Status: DC | PRN

## 2019-02-05 MED ORDER — SERTRALINE HCL 50 MG PO TABS
50.0000 mg | ORAL_TABLET | Freq: Every day | ORAL | Status: DC
  Administered 2019-02-05: 50 mg via ORAL
  Filled 2019-02-05: qty 1

## 2019-02-05 MED ORDER — INSULIN ASPART 100 UNIT/ML ~~LOC~~ SOLN
0.0000 [IU] | Freq: Three times a day (TID) | SUBCUTANEOUS | Status: DC
  Administered 2019-02-05: 5 [IU] via SUBCUTANEOUS
  Administered 2019-02-05: 2 [IU] via SUBCUTANEOUS

## 2019-02-05 MED ORDER — PROCHLORPERAZINE MALEATE 10 MG PO TABS
10.0000 mg | ORAL_TABLET | Freq: Four times a day (QID) | ORAL | Status: DC | PRN
  Filled 2019-02-05: qty 1

## 2019-02-05 MED ORDER — OXYCODONE HCL 5 MG PO TABS
10.0000 mg | ORAL_TABLET | Freq: Four times a day (QID) | ORAL | Status: DC | PRN
  Administered 2019-02-05: 10 mg via ORAL
  Filled 2019-02-05: qty 2

## 2019-02-05 MED ORDER — VANCOMYCIN HCL 10 G IV SOLR
2000.0000 mg | Freq: Once | INTRAVENOUS | Status: AC
  Administered 2019-02-05: 2000 mg via INTRAVENOUS
  Filled 2019-02-05: qty 2000

## 2019-02-05 MED ORDER — IBUPROFEN 200 MG PO TABS
600.0000 mg | ORAL_TABLET | Freq: Four times a day (QID) | ORAL | Status: DC
  Administered 2019-02-05: 600 mg via ORAL
  Filled 2019-02-05: qty 3

## 2019-02-05 MED ORDER — ENOXAPARIN SODIUM 40 MG/0.4ML ~~LOC~~ SOLN
40.0000 mg | Freq: Every day | SUBCUTANEOUS | Status: DC
  Administered 2019-02-05: 40 mg via SUBCUTANEOUS
  Filled 2019-02-05: qty 0.4

## 2019-02-05 MED ORDER — ACYCLOVIR 400 MG PO TABS
400.0000 mg | ORAL_TABLET | Freq: Two times a day (BID) | ORAL | Status: DC
  Administered 2019-02-05: 400 mg via ORAL
  Filled 2019-02-05: qty 1

## 2019-02-05 MED ORDER — FENTANYL 12 MCG/HR TD PT72
1.0000 | MEDICATED_PATCH | TRANSDERMAL | Status: DC
  Administered 2019-02-05: 1 via TRANSDERMAL
  Filled 2019-02-05: qty 1

## 2019-02-05 MED ORDER — SODIUM CHLORIDE 0.9 % IV SOLN
2.0000 g | Freq: Once | INTRAVENOUS | Status: AC
  Administered 2019-02-05: 2 g via INTRAVENOUS
  Filled 2019-02-05: qty 2

## 2019-02-05 MED ORDER — ONDANSETRON HCL 4 MG/2ML IJ SOLN: 4.0000 mg | Freq: Four times a day (QID) | INTRAMUSCULAR | Status: DC | PRN

## 2019-02-05 MED ORDER — SENNOSIDES-DOCUSATE SODIUM 8.6-50 MG PO TABS
2.0000 | ORAL_TABLET | Freq: Every day | ORAL | Status: DC
  Administered 2019-02-05: 2 via ORAL
  Filled 2019-02-05: qty 2

## 2019-02-05 MED ORDER — SIMVASTATIN 20 MG PO TABS: 20.0000 mg | ORAL_TABLET | Freq: Every day | ORAL | Status: DC

## 2019-02-05 MED ORDER — CALCIUM CARBONATE-VITAMIN D 500-200 MG-UNIT PO TABS
1.0000 | ORAL_TABLET | Freq: Two times a day (BID) | ORAL | Status: DC
  Administered 2019-02-05: 1 via ORAL
  Filled 2019-02-05: qty 1

## 2019-02-05 MED ORDER — ASPIRIN EC 81 MG PO TBEC
81.0000 mg | DELAYED_RELEASE_TABLET | Freq: Every day | ORAL | Status: DC
  Administered 2019-02-05: 81 mg via ORAL
  Filled 2019-02-05: qty 1

## 2019-02-05 MED ORDER — ACETAMINOPHEN 325 MG PO TABS: 650.0000 mg | ORAL_TABLET | Freq: Four times a day (QID) | ORAL | Status: DC | PRN

## 2019-02-05 MED ORDER — LENALIDOMIDE 15 MG PO CAPS: 15.0000 mg | ORAL_CAPSULE | Freq: Every day | ORAL | Status: DC

## 2019-02-05 MED ORDER — ACETAMINOPHEN 650 MG RE SUPP: 650.0000 mg | Freq: Four times a day (QID) | RECTAL | Status: DC | PRN

## 2019-02-05 NOTE — Discharge Instructions (Signed)
Fever, Adult     A fever is an increase in the body's temperature. It is usually defined as a temperature of 100.91F (38C) or higher. Brief mild or moderate fevers generally have no long-term effects, and they often do not need treatment. Moderate or high fevers may make you feel uncomfortable and can sometimes be a sign of a serious illness or disease. The sweating that may occur with repeated or prolonged fever may also cause a loss of fluid in the body (dehydration). Fever is confirmed by taking a temperature with a thermometer. A measured temperature can vary with:  Age.  Time of day.  Where in the body you take the temperature. Readings may vary if you place the thermometer: ? In the mouth (oral). ? In the rectum (rectal). ? In the ear (tympanic). ? Under the arm (axillary). ? On the forehead (temporal). Follow these instructions at home: Medicines  Take over-the counter and prescription medicines only as told by your health care provider. Follow the dosing instructions carefully.  If you were prescribed an antibiotic medicine, take it as told by your health care provider. Do not stop taking the antibiotic even if you start to feel better. General instructions  Watch your condition for any changes. Let your health care provider know about them.  Rest as needed.  Drink enough fluid to keep your urine pale yellow. This helps to prevent dehydration.  Sponge yourself or bathe with room-temperature water to help reduce your body temperature as needed. Do not use ice water.  Do not use too many blankets or wear clothes that are too heavy.  If your fever may be caused by an infection that spreads from person to person (is contagious), such as a cold or the flu, you should stay home from work and public gatherings for at least 24 hours after your fever is gone. Your fever should be gone without the need to use medicines. Contact a health care provider if:  You vomit.  You  cannot eat or drink without vomiting.  You have diarrhea.  You have pain when you urinate.  Your symptoms do not improve with treatment.  You develop new symptoms.  You develop excessive weakness. Get help right away if:  You have shortness of breath or have trouble breathing.  You are dizzy or you faint.  You are disoriented or confused.  You develop signs of dehydration, such as: ? Dark urine, very little urine, or no urine. ? Cracked lips. ? Dry mouth. ? Sunken eyes. ? Sleepiness. ? Weakness.  You develop severe pain in your abdomen.  You have persistent vomiting or diarrhea.  You develop a skin rash.  Your symptoms suddenly get worse. Summary  A fever is an increase in the body's temperature. It is usually defined as a temperature of 100.91F (38C) or higher. Moderate or high fevers can sometimes be a sign of a serious illness or disease. The sweating that may occur with repeated or prolonged fever may also cause dehydration.  Pay attention to any changes in your symptoms and contact your health care provider if your symptoms do not improve with treatment.  Take over-the counter and prescription medicines only as told by your health care provider. Follow the dosing instructions carefully.  If your fever is from an infection that may be contagious, such as cold or flu, you should stay home from work and public gatherings for at least 24 hours after your fever is gone. Your fever should be  gone without the need to use medicines.  Get help right away if you develop signs of dehydration, such as dark urine, cracked lips, dry mouth, sunken eyes, sleepiness, or weakness. This information is not intended to replace advice given to you by your health care provider. Make sure you discuss any questions you have with your health care provider. Document Released: 05/26/2001 Document Revised: 05/16/2018 Document Reviewed: 05/16/2018 Elsevier Interactive Patient Education  2019  [place] injection What is this medicine? CARFILZOMIB (kar FILZ oh mib) targets a specific protein within cancer cells and stops the cancer cells from growing. It is used to treat multiple myeloma. This medicine may be used for other purposes; ask your health care provider or pharmacist if you have questions. COMMON BRAND NAME(S): KYPROLIS What should I tell my health care provider before I take this medicine? They need to know if you have any of these conditions: -heart disease -history of blood clots -irregular heartbeat -kidney disease -liver disease -lung or breathing disease -an unusual or allergic reaction to carfilzomib, or other medicines, foods, dyes, or preservatives -pregnant or trying to get pregnant -breast-feeding How should I use this medicine? This medicine is for injection or infusion into a vein. It is given by a health care professional in a hospital or clinic setting. Talk to your pediatrician regarding the use of this medicine in children. Special care may be needed. Overdosage: If you think you have taken too much of this medicine contact a poison control center or emergency room at once. NOTE: This medicine is only for you. Do not share this medicine with others. What if I miss a dose? It is important not to miss your dose. Call your doctor or health care professional if you are unable to keep an appointment. What may interact with this medicine? Interactions are not expected. Give your health care provider a list of all the medicines, herbs, non-prescription drugs, or dietary supplements you use. Also tell them if you smoke, drink alcohol, or use illegal drugs. Some items may interact with your medicine. This list may not describe all possible interactions. Give your health care provider a list of all the medicines, herbs, non-prescription drugs, or dietary supplements you use. Also tell them if you smoke, drink alcohol, or use illegal drugs. Some  items may interact with your medicine. What should I watch for while using this medicine? Your condition will be monitored carefully while you are receiving this medicine. Report any side effects. Continue your course of treatment even though you feel ill unless your doctor tells you to stop. You may need blood work done while you are taking this medicine. Do not become pregnant while taking this medicine or for at least 6 months after stopping it. Women should inform their doctor if they wish to become pregnant or think they might be pregnant. There is a potential for serious side effects to an unborn child. Men should not father a child while taking this medicine and for at least 3 months after stopping it. Talk to your health care professional or pharmacist for more information. Do not breast-feed an infant while taking this medicine or for 2 weeks after the last dose. Check with your doctor or health care professional if you get an attack of severe diarrhea, nausea and vomiting, or if you sweat a lot. The loss of too much body fluid can make it dangerous for you to take this medicine. You may get dizzy. Do not drive, use machinery, or  do anything that needs mental alertness until you know how this medicine affects you. Do not stand or sit up quickly, especially if you are an older patient. This reduces the risk of dizzy or fainting spells. What side effects may I notice from receiving this medicine? Side effects that you should report to your doctor or health care professional as soon as possible: -allergic reactions like skin rash, itching or hives, swelling of the face, lips, or tongue -confusion -dizziness -feeling faint or lightheaded -fever or chills -palpitations -seizures -signs and symptoms of bleeding such as bloody or black, tarry stools; red or dark-brown urine; spitting up blood or brown material that looks like coffee grounds; red spots on the skin; unusual bruising or bleeding  including from the eye, gums, or nose -signs and symptoms of a blood clot such as breathing problems; changes in vision; chest pain; severe, sudden headache; pain, swelling, warmth in the leg; trouble speaking; sudden numbness or weakness of the face, arm or leg -signs and symptoms of kidney injury like trouble passing urine or change in the amount of urine -signs and symptoms of liver injury like dark yellow or brown urine; general ill feeling or flu-like symptoms; light-colored stools; loss of appetite; nausea; right upper belly pain; unusually weak or tired; yellowing of the eyes or skin Side effects that usually do not require medical attention (report to your doctor or health care professional if they continue or are bothersome): -back pain -cough -diarrhea -headache -muscle cramps -vomiting This list may not describe all possible side effects. Call your doctor for medical advice about side effects. You may report side effects to FDA at 1-800-FDA-1088. Where should I keep my medicine? This drug is given in a hospital or clinic and will not be stored at home. NOTE: This sheet is a summary. It may not cover all possible information. If you have questions about this medicine, talk to your doctor, pharmacist, or health care provider.  2019 Elsevier/Gold Standard (2017-09-15 14:07:13)

## 2019-02-05 NOTE — ED Notes (Signed)
Faith Gustin- [phone] Pt's spouse

## 2019-02-05 NOTE — Discharge Summary (Signed)
Physician Discharge Summary  Faith Orr IOE:[id number] DOB: Apr 20, 1941 DOA: 02/04/2019  PCP: Hoyt Koch, MD  Admit date: 02/04/2019 Discharge date: 02/05/2019  Admitted From: Home Disposition:  Home  Recommendations for Outpatient Follow-up:  1. Follow up with PCP in 1 weeks 2. Follow-up with your primary oncologist, Dr. Irene Limbo next week   [place]: No Equipment/Devices: None  Discharge Condition: Stable CODE STATUS: Full code Diet recommendation: Carbohydrate consistent  Chief Complaint: Fever  History of present illness:  Faith Orr is a 78 y.o. female with medical history significant of MM, DM2, patient presents to ED for new onset fever this evening.  Patient just had first infusion of cycle of Kyprolis (also on revlimid and decadron) yesterday 2/21.  Today she had nausea but no vomiting.  Checked temp at home and was 100.4.  Came in to ED.  No cough, no SOB, no sore throat, no abd pain, no other URI symptoms, no diarrhea, no CP.  No leg swelling, pain.  Hospital course:  Fever Patient presented the ED with fever of 100.8 at home.  Recent started on chemotherapy for multiple myeloma with Kyprolis infusion, last infusion on 02/03/2019.  Work-up in the ED unrevealing with WBC count 4.1, ANC 2600, lactic acid 1.1, rapid influenza negative, urinalysis negative.  Chest x-ray with no acute cardiopulmonary disease process.  She was monitored overnight with no recurrence of fever.  She was able to ambulate and tolerate her diet without issue.  Etiology of her febrile illness likely related to her most recent chemo infusion after review of side effect profile.  Patient wishes to return home.  Recommend Tylenol/Motrin as needed.  If fevers recur with associated symptoms feel free to return back to the ED for further evaluation.  Recommend following up with her PCP in 1 week and call your oncologist tomorrow regarding recent hospitalization for fever.  Multiple  myeloma Patient relates that he started on chemotherapy with Kyprolis.  Now has undergone 3 infusions, most recently on 02/03/2019.  Also plan start of Revlimid and Decadron; in which she has not started as of yet.  Continue follow-up with Dr. Irene Limbo, oncology outpatient.   Diabetes mellitus type 2 Last hemoglobin A1c on 10/25/2017 per chart review; was 7.6.  Resume home metformin and outpatient follow-up with PCP.   Discharge Diagnoses:  Active Problems:   Diabetes type 2, controlled ([place])   Multiple myeloma [facility])    Discharge Instructions  Discharge Instructions    Call MD for:  difficulty breathing, headache or visual disturbances   Complete by:  As directed    Call MD for:  persistant dizziness or light-headedness   Complete by:  As directed    Call MD for:  persistant nausea and vomiting   Complete by:  As directed    Call MD for:  temperature >100.4   Complete by:  As directed    Diet - low sodium heart healthy   Complete by:  As directed    Increase activity slowly   Complete by:  As directed      Allergies as of 02/05/2019      Reactions   Penicillins Anaphylaxis   "serum sickness"   Aleve [naproxen Sodium] Swelling   Swelling of face   Sulfonamide Derivatives Rash      Medication List    TAKE these medications   acyclovir 400 MG tablet Commonly known as:  ZOVIRAX Take 1 tablet (400 mg total) by mouth 2 (two) times daily.  albuterol 108 (90 Base) MCG/ACT inhaler Commonly known as:  PROVENTIL HFA;VENTOLIN HFA INHALE 1 TO 2 PUFFS INTO THE LUNGS EVERY 6 HOURS AS NEEDED FOR WHEEZING OR SHORTNESS OF BREATH What changed:  See the new instructions.   aspirin EC 81 MG tablet Take 81 mg by mouth daily after breakfast.   CALCIUM 600+D HIGH POTENCY 600-400 MG-UNIT tablet Generic drug:  Calcium Carbonate-Vitamin D Take 1 tablet by mouth 2 (two) times daily.   dexamethasone 4 MG tablet Commonly known as:  DECADRON Take 5 tablets (20 mg total) by mouth once a week.  On D1,8 and 15 of each cycle of treatment What changed:    when to take this  additional instructions   fentaNYL 12 MCG/HR Commonly known as:  Equality 1 patch onto the skin every 3 (three) days.   fluticasone 50 MCG/ACT nasal spray Commonly known as:  FLONASE Place 1 spray into both nostrils daily. What changed:    when to take this  reasons to take this   FREESTYLE FREEDOM LITE w/Device Kit Use to check blood sugars twice a day Dx 250.00   freestyle lancets Use twice daily to check sugars.   glucose blood test strip Commonly known as:  FREESTYLE LITE CHECK BLOOD SUGAR TWICE DAILY AS DIRECTED Dx 250.00   ICAPS Caps Take 1 capsule by mouth daily after breakfast.   lenalidomide 15 MG capsule Commonly known as:  REVLIMID Take 1 capsule (15 mg total) by mouth daily.   metFORMIN 500 MG 24 hr tablet Commonly known as:  GLUCOPHAGE-XR Take 3 tablets (1,500 mg total) by mouth daily with breakfast. What changed:    how much to take  when to take this  additional instructions   montelukast 10 MG tablet Commonly known as:  SINGULAIR TAKE 1 TABLET BY MOUTH DAILY AS NEEDED What changed:    when to take this  reasons to take this   ondansetron 8 MG tablet Commonly known as:  ZOFRAN Take 1 tablet (8 mg total) by mouth 2 (two) times daily as needed (Nausea or vomiting).   Oxycodone HCl 10 MG Tabs Take 1 tablet (10 mg total) by mouth every 6 (six) hours as needed for moderate pain or severe pain.   pantoprazole 20 MG tablet Commonly known as:  PROTONIX TAKE 1 TABLET(20 MG) BY MOUTH DAILY What changed:  See the new instructions.   polyethylene glycol packet Commonly known as:  MIRALAX / GLYCOLAX Take 17 g by mouth daily after breakfast.   prochlorperazine 10 MG tablet Commonly known as:  COMPAZINE Take 1 tablet (10 mg total) by mouth every 6 (six) hours as needed (Nausea or vomiting).   senna-docusate 8.6-50 MG tablet Commonly known as:  SENNA  S Take 2 tablets by mouth at bedtime.   sertraline 50 MG tablet Commonly known as:  ZOLOFT TAKE 1 TABLET BY MOUTH DAILY What changed:  when to take this   simvastatin 20 MG tablet Commonly known as:  ZOCOR TAKE 1 TABLET(20 MG) BY MOUTH DAILY What changed:  See the new instructions.   Vitamin D (Ergocalciferol) 1.25 MG (50000 UT) Caps capsule Commonly known as:  DRISDOL Take 1 capsule (50,000 Units total) by mouth every 7 (seven) days.      Follow-up Information    Hoyt Koch, MD. Call in 1 week(s).   Specialty:  Internal Medicine Contact information: [place] [id number] [phone]        Brunetta Genera, MD. Call  in 2 day(s).   Specialties:  Hematology, Oncology Contact information: [address] [place] [address] [phone]          Allergies  Allergen Reactions  . Penicillins Anaphylaxis    "serum sickness"  . Aleve [Naproxen Sodium] Swelling    Swelling of face  . Sulfonamide Derivatives Rash    Consultations: none   Procedures/Studies: Dg Chest 2 View  Result Date: 02/04/2019 CLINICAL DATA:  Fever, chills EXAM: CHEST - 2 VIEW COMPARISON:  10/14/2015 FINDINGS: Heart is borderline in size. Linear atelectasis or scarring in the lingula. No confluent airspace opacities or effusions. No acute bony abnormality. IMPRESSION: Lingular atelectasis or scarring. Borderline heart size. No active disease. Electronically Signed   By: Rolm Baptise M.D.   On: 02/04/2019 23:25   Mr Liver W Wo Contrast  Result Date: 01/18/2019 CLINICAL DATA:  Multiple myeloma, hepatic activity heterogeneity on PET-CT for further investigation. EXAM: MRI ABDOMEN WITHOUT AND WITH CONTRAST TECHNIQUE: Multiplanar multisequence MR imaging of the abdomen was performed both before and after the administration of intravenous contrast. CONTRAST:  8 cc Gadavist COMPARISON:  01/05/2019 FINDINGS: Lower chest: Mild cardiomegaly. Hepatobiliary:  Diffuse struck bout of signal in the liver compatible with hepatic steatosis. In segment 7 of the liver, a 1.2 by 1.1 cm T2 hyperintense lesion is observed without associated enhancement, compatible with a hepatic cyst. In the lateral segment left hepatic lobe, a 0.6 cm and separate 0.3 cm T2 hyperintense lesions are observed, both shown on image 37/11, neither enhancing and compatible with cysts. Peripheral wedge-shaped 2.3 by 2.1 cm focus of initial hypoenhancement but subsequent delayed accentuated enhancement on late phase images was not previously hypermetabolic. This has mildly accentuated T2 signal characteristics and overall is thought to likely represent focal scarring., less likely atypical hemangioma. In the right hepatic lobe on image 24/1501 there is a small focus of arterial phase enhancement which appears very similar to the blush of enhancement shown on CT abdomen of 05/22/2011. This is fairly poorly seen on later phase images, with only some equivocal linear enhancement in the area, and could be from an unusual hemangioma/vascular malformation or more likely, focal nodular hyperplasia. No worrisome lesions to correlate with the heterogeneity seen on MRI. No biliary dilatation. Pancreas:  Unremarkable Spleen:  Unremarkable Adrenals/Urinary Tract: There are 2 tiny left renal cysts. Adrenal glands normal. Stomach/Bowel: Unremarkable Vascular/Lymphatic:  Aortoiliac atherosclerotic vascular disease. Other:  No supplemental non-categorized findings. Musculoskeletal: Scattered bony lesions are observed including a 2.8 by 1.8 cm lesion in the left side of the T9 vertebral body, corresponding to the patient's known myeloma/lytic lesions. IMPRESSION: 1. Several appreciable liver lesions all have benign imaging characteristics. No MRI findings of metastatic involvement of the liver. 2. Scattered bony lesions corresponding to the lytic lesions seen at PET-CT, compatible with active myeloma. 3.  Aortic  Atherosclerosis (ICD10-I70.0).  Mild cardiomegaly. 4. Diffuse hepatic steatosis. Electronically Signed   By: Van Clines M.D.   On: 01/18/2019 09:16       Subjective: Patient resting comfortably in bedside chair.  No recurrence of fever since admission.  Able to tolerate diet.  Wishes discharge home.  No other concerns this afternoon.  Currently denies fever, no chills, no night sweats, no chest pain, no palpitations, no shortness of breath, no abdominal pain, no cough/congestion, no weakness, no paresthesias.  No acute events overnight per nursing staff.   Discharge Exam: Vitals:   02/05/19 0230 02/05/19 0358  BP: (!) 116/57 120/63  Pulse: 88  80  Resp: 17   Temp:  98.1 F (36.7 C)  SpO2: 96% 97%   Vitals:   02/05/19 0100 02/05/19 0200 02/05/19 0230 02/05/19 0358  BP: 121/62 113/62 (!) 116/57 120/63  Pulse: 90 90 88 80  Resp: _0 Temp:    98.1 F (36.7 C)  TempSrc:    Oral  SpO2: 95% 96% 96% 97%  Weight:    80.7 kg  Height:    _1  (1.499 m)    General: Pt is alert, awake, not in acute distress Cardiovascular: RRR, S1/S2 +, no rubs, no gallops Respiratory: CTA bilaterally, no wheezing, no rhonchi Abdominal: Soft, NT, ND, bowel sounds + Extremities: no edema, no cyanosis    The results of significant diagnostics from this hospitalization (including imaging, microbiology, ancillary and laboratory) are listed below for reference.     Microbiology: Recent Results (from the past 240 hour(s))  Blood culture (routine x 2)     Status: None (Preliminary result)   Collection Time: 02/04/19 11:35 PM  Result Value Ref Range Status   Specimen Description BLOOD RIGHT ANTECUBITAL  Final   Special Requests   Final    BOTTLES DRAWN AEROBIC AND ANAEROBIC Blood Culture results may not be optimal due to an excessive volume of blood received in culture bottles Performed at [facility], [place] [address], [place], [place] [address]    Culture NO GROWTH  < 12 HOURS  Final   Report Status PENDING  Incomplete  Blood culture (routine x 2)     Status: None (Preliminary result)   Collection Time: 02/04/19 11:45 PM  Result Value Ref Range Status   Specimen Description BLOOD LEFT HAND  Final   Special Requests   Final    BOTTLES DRAWN AEROBIC AND ANAEROBIC Blood Culture results may not be optimal due to an excessive volume of blood received in culture bottles Performed at [facility], [place] [address], [place], [place] [address]    Culture NO GROWTH < 12 HOURS  Final   Report Status PENDING  Incomplete     Labs: BNP (last 3 results) No results for input(s): BNP in the last 8760 hours. Basic Metabolic Panel: Recent Labs  Lab 02/02/19 1305 02/04/19 2300 02/05/19 0435  NA 136 130* 138  K 4.0 3.7 4.1  CL 103 101 110  CO2 21* 22 21*  GLUCOSE 216* 206* 242*  BUN _2 CREATININE 0.76 0.60 0.67  CALCIUM 10.4* 9.1 8.8*   Liver Function Tests: Recent Labs  Lab 02/02/19 1305 02/04/19 2300  AST 63* 64*  ALT 62* 61*  ALKPHOS 117 97  BILITOT 0.5 0.6  PROT 6.7 6.2*  ALBUMIN 3.7 3.8   No results for input(s): LIPASE, AMYLASE in the last 168 hours. No results for input(s): AMMONIA in the last 168 hours. CBC: Recent Labs  Lab 02/02/19 1305 02/04/19 2300 02/05/19 0435  WBC 7.7 4.8 4.1  NEUTROABS 5.4 2.6  --   HGB 13.4 11.5* 10.7*  HCT 39.9 34.2* 33.1*  MCV 92.4 94.0 96.8  PLT 219 144* 136*   Cardiac Enzymes: No results for input(s): CKTOTAL, CKMB, CKMBINDEX, TROPONINI in the last 168 hours. BNP: Invalid input(s): POCBNP CBG: Recent Labs  Lab 02/05/19 0821 02/05/19 1158  GLUCAP 141* 237*   D-Dimer No results for input(s): DDIMER in the last 72 hours. Hgb A1c No results for input(s): HGBA1C in the last 72 hours. Lipid Profile No results for input(s): CHOL,  HDL, LDLCALC, TRIG, CHOLHDL, LDLDIRECT in the last 72 hours. Thyroid function studies No results for input(s): TSH, T4TOTAL, T3FREE,  THYROIDAB in the last 72 hours.  Invalid input(s): FREET3 Anemia work up No results for input(s): VITAMINB12, FOLATE, FERRITIN, TIBC, IRON, RETICCTPCT in the last 72 hours. Urinalysis    Component Value Date/Time   COLORURINE STRAW (A) 02/05/2019 0410   APPEARANCEUR CLEAR 02/05/2019 0410   LABSPEC 1.004 (L) 02/05/2019 0410   PHURINE 6.0 02/05/2019 0410   GLUCOSEU NEGATIVE 02/05/2019 0410   GLUCOSEU NEGATIVE 05/20/2011 0904   HGBUR NEGATIVE 02/05/2019 0410   BILIRUBINUR NEGATIVE 02/05/2019 0410   BILIRUBINUR n 05/04/2018 1340   KETONESUR NEGATIVE 02/05/2019 0410   PROTEINUR NEGATIVE 02/05/2019 0410   UROBILINOGEN 0.2 05/04/2018 1340   UROBILINOGEN 0.2 05/20/2011 0904   NITRITE NEGATIVE 02/05/2019 0410   LEUKOCYTESUR NEGATIVE 02/05/2019 0410   Sepsis Labs Invalid input(s): PROCALCITONIN,  WBC,  LACTICIDVEN Microbiology Recent Results (from the past 240 hour(s))  Blood culture (routine x 2)     Status: None (Preliminary result)   Collection Time: 02/04/19 11:35 PM  Result Value Ref Range Status   Specimen Description BLOOD RIGHT ANTECUBITAL  Final   Special Requests   Final    BOTTLES DRAWN AEROBIC AND ANAEROBIC Blood Culture results may not be optimal due to an excessive volume of blood received in culture bottles Performed at [facility], [place] [address], [place], [place] [address]    Culture NO GROWTH < 12 HOURS  Final   Report Status PENDING  Incomplete  Blood culture (routine x 2)     Status: None (Preliminary result)   Collection Time: 02/04/19 11:45 PM  Result Value Ref Range Status   Specimen Description BLOOD LEFT HAND  Final   Special Requests   Final    BOTTLES DRAWN AEROBIC AND ANAEROBIC Blood Culture results may not be optimal due to an excessive volume of blood received in culture bottles Performed at [facility], [place] [address] [place], [place] [address]    Culture NO GROWTH < 12 HOURS  Final   Report Status PENDING   Incomplete     Time coordinating discharge: Over 30 minutes  SIGNED:   Eric J [place] DO  [facility] 02/05/2019, 12:39 PM

## 2019-02-05 NOTE — Progress Notes (Signed)
A consult was received from an ED physician for vancomycin/cefepime per pharmacy dosing.  The patient's profile has been reviewed for ht/wt/allergies/indication/available labs.   A one time order has been placed for Vancomycin 2 Gm and Cefepime 2 Gm . Pt with PCN allergy but has tolerated keflex in the past.  Further antibiotics/pharmacy consults should be ordered by admitting physician if indicated.                       Thank you, Dorrene German 02/05/2019  1:29 AM

## 2019-02-05 NOTE — H&P (Signed)
History and Physical    Faith Orr:[id number] DOB: 06-06-1941 DOA: 02/04/2019  PCP: Hoyt Koch, MD  Patient coming from: Home  I have personally briefly reviewed patient's old medical records in [place]  Chief Complaint: Fever  HPI: Faith Orr is a 78 y.o. female with medical history significant of MM, DM2, patient presents to ED for new onset fever this evening.  Patient just had first infusion of cycle of Kyprolis (also on revlimid and decadron) yesterday 2/21.  Today she had nausea but no vomiting.  Checked temp at home and was 100.4.  Came in to ED.  No cough, no SOB, no sore throat, no abd pain, no other URI symptoms, no diarrhea, no CP.  No leg swelling, pain.   ED Course: Tm 100.8.  WBC 4.8k.  O2 sat 88% on RA, not normally on O2.  Satting 95% on 2L via [place].  HR 90  CXR shows Lingular atelectasis or scaring.  Influenza PCR is negative.  BCx and UA are pending.  EDP gave dose of cefepime.   Review of Systems: As per HPI otherwise 10 point review of systems negative.   Past Medical History:  Diagnosis Date  . Allergy    seasonal  . Asthma   . DEPRESSION   . DIABETES MELLITUS, TYPE II   . Diverticulosis   . HYPERLIPIDEMIA   . Macular degeneration of left eye    mild, Dr.Hecker  . Obesity, unspecified   . Osteoarthritis of both knees   . OSTEOPENIA   . Osteopenia   . URINARY INCONTINENCE     Past Surgical History:  Procedure Laterality Date  . BREAST SURGERY    . CATARACT EXTRACTION Left 05/24/2018  . CESAREAN SECTION  01/1973  . FRACTURE SURGERY    . left wrist surgery  2008   By Dr. Latanya Maudlin  . right ankle  1994     reports that she has never smoked. She has never used smokeless tobacco. She reports that she does not drink alcohol or use drugs.  Allergies  Allergen Reactions  . Penicillins Anaphylaxis    "serum sickness"  . Aleve [Naproxen Sodium] Swelling    Swelling of face  . Sulfonamide Derivatives Rash    Family  History  Problem Relation Age of Onset  . Diabetes Father   . Hyperlipidemia Father   . Heart disease Father   . Cancer Father   . Hypertension Father   . Colon cancer Paternal Grandmother 46  . Osteoporosis Mother   . Protein S deficiency Mother   . Hyperlipidemia Mother   . Multiple sclerosis Daughter   . Cancer Other        bladder  . Breast cancer Neg Hx      Prior to Admission medications   Medication Sig Start Date End Date Taking? Authorizing Provider  acyclovir (ZOVIRAX) 400 MG tablet Take 1 tablet (400 mg total) by mouth 2 (two) times daily. 01/16/19  Yes Brunetta Genera, MD  albuterol (PROVENTIL HFA;VENTOLIN HFA) 108 (90 Base) MCG/ACT inhaler INHALE 1 TO 2 PUFFS INTO THE LUNGS EVERY 6 HOURS AS NEEDED FOR WHEEZING OR SHORTNESS OF BREATH Patient taking differently: Inhale 2 puffs into the lungs every 6 (six) hours as needed for wheezing or shortness of breath.  03/09/18  Yes Hoyt Koch, MD  aspirin EC 81 MG tablet Take 81 mg by mouth daily after breakfast.    Yes [provider]  Blood Glucose Monitoring Suppl (  FREESTYLE FREEDOM LITE) W/DEVICE KIT Use to check blood sugars twice a day Dx 250.00 06/01/14  Yes Rowe Clack, MD  Calcium Carbonate-Vitamin D (CALCIUM 600+D HIGH POTENCY) 600-400 MG-UNIT per tablet Take 1 tablet by mouth 2 (two) times daily.    Yes [provider]  dexamethasone (DECADRON) 4 MG tablet Take 5 tablets (20 mg total) by mouth once a week. On D1,8 and 15 of each cycle of treatment Patient taking differently: Take 20 mg by mouth See admin instructions. On Day 1,8 and 15 of each cycle of treatment 01/16/19  Yes Brunetta Genera, MD  fluticasone [facility]) 50 MCG/ACT nasal spray Place 1 spray into both nostrils daily. Patient taking differently: Place 1 spray into both nostrils daily as needed for allergies or rhinitis.  03/09/18  Yes Hoyt Koch, MD  glucose blood (FREESTYLE LITE) test strip CHECK BLOOD SUGAR  TWICE DAILY AS DIRECTED Dx 250.00 07/13/14  Yes Rowe Clack, MD  Lancets (FREESTYLE) lancets Use twice daily to check sugars. 04/15/16  Yes Hoyt Koch, MD  metFORMIN (GLUCOPHAGE-XR) 500 MG 24 hr tablet Take 3 tablets (1,500 mg total) by mouth daily with breakfast. Patient taking differently: Take 500-1,000 mg by mouth See admin instructions. 500 mg every morning, 1,000 mg every night 06/06/18  Yes Hoyt Koch, MD  montelukast (SINGULAIR) 10 MG tablet TAKE 1 TABLET BY MOUTH DAILY AS NEEDED Patient taking differently: Take 10 mg by mouth at bedtime as needed (allergies).  05/11/18  Yes Hoyt Koch, MD  Multiple Vitamins-Minerals (ICAPS) CAPS Take 1 capsule by mouth daily after breakfast.    Yes [provider]  ondansetron (ZOFRAN) 8 MG tablet Take 1 tablet (8 mg total) by mouth 2 (two) times daily as needed (Nausea or vomiting). 01/16/19  Yes Brunetta Genera, MD  oxyCODONE 10 MG TABS Take 1 tablet (10 mg total) by mouth every 6 (six) hours as needed for moderate pain or severe pain. 01/31/19  Yes Brunetta Genera, MD  pantoprazole (PROTONIX) 20 MG tablet TAKE 1 TABLET(20 MG) BY MOUTH DAILY Patient taking differently: Take 20 mg by mouth daily as needed for heartburn or indigestion.  11/19/17  Yes Hoyt Koch, MD  polyethylene glycol (MIRALAX / GLYCOLAX) packet Take 17 g by mouth daily after breakfast.    Yes [provider]  prochlorperazine (COMPAZINE) 10 MG tablet Take 1 tablet (10 mg total) by mouth every 6 (six) hours as needed (Nausea or vomiting). 01/16/19  Yes Brunetta Genera, MD  senna-docusate (SENNA S) 8.6-50 MG tablet Take 2 tablets by mouth at bedtime. 01/10/19  Yes Brunetta Genera, MD  sertraline (ZOLOFT) 50 MG tablet TAKE 1 TABLET BY MOUTH DAILY Patient taking differently: Take 50 mg by mouth at bedtime.  11/01/18  Yes Hoyt Koch, MD  simvastatin (ZOCOR) 20 MG tablet TAKE 1 TABLET(20 MG) BY MOUTH  DAILY Patient taking differently: Take 20 mg by mouth every evening.  11/01/18  Yes Hoyt Koch, MD  Vitamin D, Ergocalciferol, (DRISDOL) 1.25 MG (50000 UT) CAPS capsule Take 1 capsule (50,000 Units total) by mouth every 7 (seven) days. 12/12/18  Yes Hoyt Koch, MD  fentaNYL (DURAGESIC) 12 MCG/HR Place 1 patch onto the skin every 3 (three) days. 02/02/19   Brunetta Genera, MD  lenalidomide (REVLIMID) 15 MG capsule Take 1 capsule (15 mg total) by mouth daily. 01/25/19   Brunetta Genera, MD    Physical Exam: Vitals:   02/04/19 7106  02/05/19 0030 02/05/19 0032 02/05/19 0100  BP: 125/66 (!) 123/56  121/62  Pulse: 98 95  90  Resp: 15 19  16  Temp:   99.5 F (37.5 C)   TempSrc:   Oral   SpO2: 93% 91%  95%    Constitutional: NAD, calm, comfortable Eyes: PERRL, lids and conjunctivae normal ENMT: Mucous membranes are moist. Posterior pharynx clear of any exudate or lesions.Normal dentition.  Neck: normal, supple, no masses, no thyromegaly Respiratory: clear to auscultation bilaterally, no wheezing, no crackles. Normal respiratory effort. No accessory muscle use.  Cardiovascular: Regular rate and rhythm, no murmurs / rubs / gallops. No extremity edema. 2+ pedal pulses. No carotid bruits.  Abdomen: no tenderness, no masses palpated. No hepatosplenomegaly. Bowel sounds positive.  Musculoskeletal: no clubbing / cyanosis. No joint deformity upper and lower extremities. Good ROM, no contractures. Normal muscle tone.  Skin: no rashes, lesions, ulcers. No induration Neurologic: CN 2-12 grossly intact. Sensation intact, DTR normal. Strength 5/5 in all 4.  Psychiatric: Normal judgment and insight. Alert and oriented x 3. Normal mood.    Labs on Admission: I have personally reviewed following labs and imaging studies  CBC: Recent Labs  Lab 02/02/19 1305 02/04/19 2300  WBC 7.7 4.8  NEUTROABS 5.4 2.6  HGB 13.4 11.5*  HCT 39.9 34.2*  MCV 92.4 94.0  PLT 219 144*    Basic Metabolic Panel: Recent Labs  Lab 02/02/19 1305 02/04/19 2300  NA 136 130*  K 4.0 3.7  CL 103 101  CO2 21* 22  GLUCOSE 216* 206*  BUN 18 15  CREATININE 0.76 0.60  CALCIUM 10.4* 9.1   GFR: Estimated Creatinine Clearance: 55.9 mL/min (by C-G formula based on SCr of 0.6 mg/dL). Liver Function Tests: Recent Labs  Lab 02/02/19 1305 02/04/19 2300  AST 63* 64*  ALT 62* 61*  ALKPHOS 117 97  BILITOT 0.5 0.6  PROT 6.7 6.2*  ALBUMIN 3.7 3.8   No results for input(s): LIPASE, AMYLASE in the last 168 hours. No results for input(s): AMMONIA in the last 168 hours. Coagulation Profile: No results for input(s): INR, PROTIME in the last 168 hours. Cardiac Enzymes: No results for input(s): CKTOTAL, CKMB, CKMBINDEX, TROPONINI in the last 168 hours. BNP (last 3 results) No results for input(s): PROBNP in the last 8760 hours. HbA1C: No results for input(s): HGBA1C in the last 72 hours. CBG: No results for input(s): GLUCAP in the last 168 hours. Lipid Profile: No results for input(s): CHOL, HDL, LDLCALC, TRIG, CHOLHDL, LDLDIRECT in the last 72 hours. Thyroid Function Tests: No results for input(s): TSH, T4TOTAL, FREET4, T3FREE, THYROIDAB in the last 72 hours. Anemia Panel: No results for input(s): VITAMINB12, FOLATE, FERRITIN, TIBC, IRON, RETICCTPCT in the last 72 hours. Urine analysis:    Component Value Date/Time   COLORURINE YELLOW 05/20/2011 0904   APPEARANCEUR CLEAR 05/20/2011 0904   LABSPEC >=1.030 05/20/2011 0904   PHURINE 5.5 05/20/2011 0904   GLUCOSEU NEGATIVE 05/20/2011 0904   HGBUR SMALL 05/20/2011 0904   BILIRUBINUR n 05/04/2018 1340   KETONESUR NEGATIVE 05/20/2011 0904   PROTEINUR Negative 05/04/2018 1340   UROBILINOGEN 0.2 05/04/2018 1340   UROBILINOGEN 0.2 05/20/2011 0904   NITRITE n 05/04/2018 1340   NITRITE NEGATIVE 05/20/2011 0904   LEUKOCYTESUR Negative 05/04/2018 1340    Radiological Exams on Admission: Dg Chest 2 View  Result Date:  02/04/2019 CLINICAL DATA:  Fever, chills EXAM: CHEST - 2 VIEW COMPARISON:  10/14/2015 FINDINGS: Heart is borderline in size. Linear atelectasis or   scarring in the lingula. No confluent airspace opacities or effusions. No acute bony abnormality. IMPRESSION: Lingular atelectasis or scarring. Borderline heart size. No active disease. Electronically Signed   By: Kevin  Dover M.D.   On: 02/04/2019 23:25    EKG: Independently reviewed.  Assessment/Plan Principal Problem:   Hypoxia Active Problems:   Diabetes type 2, controlled (HCC)   Multiple myeloma (HCC)   Fever    1. Hypoxia and fever - 1. Possibly just chemo rxn? 2. O2 via [place] 3. Patient quite asymptomatic however 4. Will observe overnight 5. And hold off on further ABx unless she develops worsening WBC or more obvious localizing symptoms. 6. Tylenol PRN fever 7. Repeat CBC/BMP in AM 2. MM - 1. Holding revlemid for the moment, at least until oncology can weigh in on wether to continue this or not tomorrow 3. DM2 - 1. Hold Metformin 2. Moderate SSI AC  DVT prophylaxis: Lovenox Code Status: Full Family Communication: No family in room Disposition Plan: Home after admit Consults called: IP consult to oncology placed in epic Admission status: Place in obs   GARDNER, JARED M. DO [facility]  How to contact the [facility] Attending or Consulting provider 7A - 7P or covering provider during after hours 7P -7A, for this patient?  1. Check the care team in [facility] and look for a) attending/consulting [facility] provider listed and b) the [facility] team listed 2. Log into [email or web address]  Amion Physician Scheduling and messaging for groups and whole hospitals  On call and physician scheduling software for group practices, residents, hospitalists and other medical providers for call, clinic, rotation and shift schedules. OnCall [employer] is a hospital-wide system for scheduling doctors and paging doctors on call. EasyPlot is for scientific plotting and  data analysis.  [email or web address]  and use 's universal password to access. If you do not have the password, please contact the hospital operator.  3. Locate the [facility] provider you are looking for under [facility] and page to a number that you can be directly reached. 4. If you still have difficulty reaching the provider, please page the DOC (Director on Call) for the Hospitalists listed on amion for assistance.  02/05/2019, 2:07 AM      

## 2019-02-05 NOTE — Plan of Care (Signed)

## 2019-02-05 NOTE — ED Notes (Signed)
Patient made aware that urine sample is needed.  

## 2019-02-05 NOTE — ED Notes (Signed)
ED TO INPATIENT HANDOFF REPORT  Name/Age/Gender Faith Orr 78 y.o. female  Code Status    Code Status Orders  (From admission, onward)         Start     Ordered   02/05/19 0204  Full code  Continuous     02/05/19 0205        Code Status History    This patient has a current code status but no historical code status.      Home/SNF/Other Home  Chief Complaint Cancer Patient / Fever 100.4  Level of Care/Admitting Diagnosis ED Disposition    ED Disposition Condition Comment   Admit  Hospital Area: [facility] [[id number]]  Level of Care: Med-Surg [16]  Diagnosis: Hypoxia [413244]  Admitting Physician: Doreatha Massed  Attending Physician: Etta Quill [[id number]]  PT Class (Do Not Modify): Observation [104]  PT Acc Code (Do Not Modify): Observation [10022]       Medical History Past Medical History:  Diagnosis Date  . Allergy    seasonal  . Asthma   . DEPRESSION   . DIABETES MELLITUS, TYPE II   . Diverticulosis   . HYPERLIPIDEMIA   . Macular degeneration of left eye    mild, Dr.Hecker  . Obesity, unspecified   . Osteoarthritis of both knees   . OSTEOPENIA   . Osteopenia   . URINARY INCONTINENCE     Allergies Allergies  Allergen Reactions  . Penicillins Anaphylaxis    "serum sickness"  . Aleve [Naproxen Sodium] Swelling    Swelling of face  . Sulfonamide Derivatives Rash    IV Location/Drains/Wounds Patient Lines/Drains/Airways Status   Active Line/Drains/Airways    Name:   Placement date:   Placement time:   Site:   Days:   Peripheral IV 02/04/19 Right Antecubital   02/04/19    2351    Antecubital   1          Labs/Imaging Results for orders placed or performed during the hospital encounter of 02/04/19 (from the past 48 hour(s))  CBC with Differential     Status: Abnormal   Collection Time: 02/04/19 11:00 PM  Result Value Ref Range   WBC 4.8 4.0 - 10.5 K/uL   RBC 3.64 (L) 3.87 - 5.11 MIL/uL   Hemoglobin  11.5 (L) 12.0 - 15.0 g/dL   HCT 34.2 (L) 36.0 - 46.0 %   MCV 94.0 80.0 - 100.0 fL   MCH 31.6 26.0 - 34.0 pg   MCHC 33.6 30.0 - 36.0 g/dL   RDW 12.6 11.5 - 15.5 %   Platelets 144 (L) 150 - 400 K/uL   nRBC 0.4 (H) 0.0 - 0.2 %   Neutrophils Relative % 52 %   Neutro Abs 2.6 1.7 - 7.7 K/uL   Lymphocytes Relative 12 %   Lymphs Abs 0.6 (L) 0.7 - 4.0 K/uL   Monocytes Relative 33 %   Monocytes Absolute 1.6 (H) 0.1 - 1.0 K/uL   Eosinophils Relative 1 %   Eosinophils Absolute 0.0 0.0 - 0.5 K/uL   Basophils Relative 0 %   Basophils Absolute 0.0 0.0 - 0.1 K/uL   Immature Granulocytes 2 %   Abs Immature Granulocytes 0.08 (H) 0.00 - 0.07 K/uL    Comment: Performed at [facility], [place] [address], [place], [place] [address]  Comprehensive metabolic panel     Status: Abnormal   Collection Time: 02/04/19 11:00 PM  Result Value Ref Range   Sodium 130 (  L) 135 - 145 mmol/L   Potassium 3.7 3.5 - 5.1 mmol/L   Chloride 101 98 - 111 mmol/L   CO2 22 22 - 32 mmol/L   Glucose, Bld 206 (H) 70 - 99 mg/dL   BUN 15 8 - 23 mg/dL   Creatinine, Ser 0.60 0.44 - 1.00 mg/dL   Calcium 9.1 8.9 - 10.3 mg/dL   Total Protein 6.2 (L) 6.5 - 8.1 g/dL   Albumin 3.8 3.5 - 5.0 g/dL   AST 64 (H) 15 - 41 U/L   ALT 61 (H) 0 - 44 U/L   Alkaline Phosphatase 97 38 - 126 U/L   Total Bilirubin 0.6 0.3 - 1.2 mg/dL   GFR calc non Af Amer >60 >60 mL/min   GFR calc Af Amer >60 >60 mL/min   Anion gap 7 5 - 15    Comment: Performed at [facility], [place] [address], [place], [place] [address]  Lactic acid, plasma     Status: None   Collection Time: 02/04/19 11:00 PM  Result Value Ref Range   Lactic Acid, Venous 1.1 0.5 - 1.9 mmol/L    Comment: Performed at [facility], [place] [address] [place], [place] [address]  Influenza panel by PCR (type A & B)     Status: None   Collection Time: 02/04/19 11:01 PM  Result Value Ref Range   Influenza A By PCR NEGATIVE NEGATIVE    Influenza B By PCR NEGATIVE NEGATIVE    Comment: (NOTE) The Xpert Xpress Flu assay is intended as an aid in the diagnosis of  influenza and should not be used as a sole basis for treatment.  This  assay is FDA approved for nasopharyngeal swab specimens only. Nasal  washings and aspirates are unacceptable for Xpert Xpress Flu testing. Performed at [facility], [place] [address], [place], [place] [address]    Dg Chest 2 View  Result Date: 02/04/2019 CLINICAL DATA:  Fever, chills EXAM: CHEST - 2 VIEW COMPARISON:  10/14/2015 FINDINGS: Heart is borderline in size. Linear atelectasis or scarring in the lingula. No confluent airspace opacities or effusions. No acute bony abnormality. IMPRESSION: Lingular atelectasis or scarring. Borderline heart size. No active disease. Electronically Signed   By: Rolm Baptise M.D.   On: 02/04/2019 23:25    Pending Labs Unresulted Labs (From admission, onward)    Start     Ordered   02/05/19 0500  CBC  Tomorrow morning,   R     02/05/19 0205   02/05/19 8413  Basic metabolic panel  Tomorrow morning,   R     02/05/19 0205   02/04/19 2259  Urinalysis, Routine w reflex microscopic  Once,   R     02/04/19 2300   02/04/19 2259  Urine culture  ONCE - STAT,   STAT     02/04/19 2300   02/04/19 2259  Blood culture (routine x 2)  BLOOD CULTURE X 2,   STAT     02/04/19 2300          Vitals/Pain Today's Vitals   02/05/19 0031 02/05/19 0032 02/05/19 0100 02/05/19 0200  BP:   121/62 113/62  Pulse:   90 90  Resp:   16 15  Temp:  99.5 F (37.5 C)    TempSrc:  Oral    SpO2:   95% 96%  PainSc: 0-No pain  0-No pain     Isolation Precautions Droplet precaution  Medications Medications  vancomycin (VANCOCIN)  2,000 mg in sodium chloride 0.9 % 500 mL IVPB (has no administration in time range)  ceFEPIme (MAXIPIME) 2 g in sodium chloride 0.9 % 100 mL IVPB (2 g Intravenous New Bag/Given 02/05/19 0155)  acyclovir (ZOVIRAX) tablet 400 mg (has no  administration in time range)  albuterol (PROVENTIL HFA;VENTOLIN HFA) 108 (90 Base) MCG/ACT inhaler 1-2 puff (has no administration in time range)  aspirin EC tablet 81 mg (has no administration in time range)  Calcium Carbonate-Vitamin D 600-400 MG-UNIT 1 tablet (has no administration in time range)  fentaNYL (DURAGESIC) 12 MCG/HR 1 patch (has no administration in time range)  fluticasone (FLONASE) 50 MCG/ACT nasal spray 1 spray (has no administration in time range)  Oxycodone HCl TABS 10 mg (has no administration in time range)  prochlorperazine (COMPAZINE) tablet 10 mg (has no administration in time range)  simvastatin (ZOCOR) tablet 20 mg (has no administration in time range)  sertraline (ZOLOFT) tablet 50 mg (has no administration in time range)  senna-docusate (Senokot-S) tablet 2 tablet (has no administration in time range)  ICAPS CAPS 1 capsule (has no administration in time range)  ibuprofen (ADVIL,MOTRIN) tablet 600 mg (has no administration in time range)  acetaminophen (TYLENOL) tablet 650 mg (has no administration in time range)    Or  acetaminophen (TYLENOL) suppository 650 mg (has no administration in time range)  ondansetron (ZOFRAN) tablet 4 mg (has no administration in time range)    Or  ondansetron (ZOFRAN) injection 4 mg (has no administration in time range)  enoxaparin (LOVENOX) injection 40 mg (has no administration in time range)  insulin aspart (novoLOG) injection 0-15 Units (has no administration in time range)  acetaminophen (TYLENOL) tablet 650 mg (650 mg Oral Given 02/04/19 2353)  sodium chloride 0.9 % bolus 1,000 mL (0 mLs Intravenous Stopped 02/05/19 0007)  ondansetron (ZOFRAN) injection 4 mg (4 mg Intravenous Given 02/04/19 2353)    Mobility walks with device

## 2019-02-05 NOTE — ED Notes (Signed)
Patient's oxygen saturation dropped to 88% on RA, encouraged patient to take deep breaths with no improvement. Placed patient on 2 L of O2 via [place].

## 2019-02-06 ENCOUNTER — Telehealth: Payer: Self-pay | Admitting: *Deleted

## 2019-02-06 LAB — URINE CULTURE: CULTURE: NO GROWTH

## 2019-02-06 MED ORDER — LENALIDOMIDE 15 MG PO CAPS: 15.0000 mg | ORAL_CAPSULE | Freq: Every day | ORAL | 0 refills | Status: DC

## 2019-02-06 NOTE — Telephone Encounter (Signed)
Transition Care Management Follow-up Telephone Call   Date discharged? 02/05/19   How have you been since you were released from the hospital? Pt states she is feeling alright. Have not had fever    Do you understand why you were in the hospital? YES   Do you understand the discharge instructions? YES   Where were you discharged to? Home   Items Reviewed:  Medications reviewed: YES  Allergies reviewed: YES  Dietary changes reviewed: YES, carb modified  Referrals reviewed: No referral recommended, but will have another infusion next week   Functional Questionnaire:   Activities of Daily Living (ADLs):   She states she are independent in the following: ambulation, bathing and hygiene, feeding, continence, grooming, toileting and dressing States she doesn't require assistance    Any transportation issues/concerns?: NO   Any patient concerns? NO   Confirmed importance and date/time of follow-up visits scheduled YES, appt 02/07/19  Provider Appointment booked with Dr. Sharlet Salina  Confirmed with patient if condition begins to worsen call PCP or go to the ER.  Patient was given the office number and encouraged to call back with question or concerns.  : YES

## 2019-02-06 NOTE — Telephone Encounter (Signed)
Oral Oncology Pharmacist Encounter  Received [employer] authorization number from collaborative practice RN for patient's Revlimid prescription.  Revlimid prescription has been E scribed to ([facility]) Geophysicist/field seismologist. I have also faxed supporting documentation to dispensing pharmacy including insurance card, medication list, demographic information, and foundation copayment grant information. I have also alerted contact at dispensing pharmacy to be on the look out for incoming prescription.  Patient previously informed about dispensing pharmacy. We will continue to follow-up with dispensing pharmacy to ensure patient receives her prescription in a timely manner.  Johny Drilling, PharmD, BCPS, BCOP  02/06/2019 10:41 AM [facility] [phone]

## 2019-02-06 NOTE — Telephone Encounter (Signed)
-----   Message from Zola Button, RN sent at 02/02/2019  4:43 PM EST ----- Regarding: Dr. Irene Limbo; First time F/U Call Pt received first time Kyprolis 02/02/19 and again 02/03/19. Tolerated well. Thank you!

## 2019-02-06 NOTE — Telephone Encounter (Addendum)
Called Faith Orr for chemotherapy F/U.  Patient is doing well today after ED visit 02-04-2019.  "Felt bad Saturday.  Couldn't eat, nauseous, chilled to the bone, then hot with T = 100.6, face beet red, pounding headache.   Temp increased to 100.8  in ED.  O2 sat dropped as low as 88%.  Staff says I was short of breath but I wasn't.  I use inhaler and Flonase for seasonal allergies.  Pneumonia, flu, several things were ruled out leaving this as a side effect of treatment.   Will I receive the same drug; experience the same side effect?  I do not want to be in  the ED every weekend.  Revlimid process was just started Friday.  No problems with bowel, bladder, N/V or appetite, pain or sleep now that my temperature went down."   Instructed to continue 64 oz minimum water daily or at least the day before, of and after treatment.  No further questions or needs at this time.  Reviewed calling [phone] during and after hours for symptoms, changes or events.  Nurse call center assistance received after hours.Marland Kitchen

## 2019-02-06 NOTE — Telephone Encounter (Signed)
Pt was on TCM report tried calling pt to make hosp follow-up appt had to leave msg for pt RTC. Per summary need to f/u w/PCP in 1 week...Faith Orr

## 2019-02-07 ENCOUNTER — Encounter: Payer: Self-pay | Admitting: Internal Medicine

## 2019-02-07 ENCOUNTER — Ambulatory Visit: Payer: Medicare Other | Admitting: Internal Medicine

## 2019-02-07 DIAGNOSIS — C9000 Multiple myeloma not having achieved remission: Secondary | ICD-10-CM | POA: Diagnosis not present

## 2019-02-07 DIAGNOSIS — E118 Type 2 diabetes mellitus with unspecified complications: Secondary | ICD-10-CM

## 2019-02-07 MED ORDER — FREESTYLE LIBRE 14 DAY SENSOR MISC: 1.0000 | 11 refills | Status: DC

## 2019-02-07 NOTE — Progress Notes (Signed)
   Subjective:   Patient ID: Faith Orr, female    DOB: 1941-09-01, 78 y.o.   MRN: [id number]  HPI The patient is a 78 YO female coming in for hospital follow up (in for fever s/p chemo, ruled out flu, UTI, pneumonia, blood infection, fever went away the next day naturally). She denies current flushing or fevers. This did start about 24 hours after chemo infusion. She did take decadron 20 mg morning of infusion. She is concerned due to her diabetes. She is not checking sugars currently due to other concerns with her MM treatment. Denies dysuria or cough or fevers or chills.   PMH, [facility], social history reviewed and updated.  Review of Systems  Constitutional: Positive for activity change. Negative for appetite change, fatigue, fever and unexpected weight change.  HENT: Negative.   Eyes: Negative.   Respiratory: Negative for cough, chest tightness and shortness of breath.   Cardiovascular: Negative for chest pain, palpitations and leg swelling.  Gastrointestinal: Negative for abdominal distention, abdominal pain, constipation, diarrhea, nausea and vomiting.  Musculoskeletal: Positive for arthralgias, gait problem and myalgias.  Skin: Negative.   Neurological: Negative for dizziness, syncope, speech difficulty, weakness and light-headedness.  Psychiatric/Behavioral: Negative.     Objective:  Physical Exam Constitutional:      Appearance: She is well-developed.  HENT:     Head: Normocephalic and atraumatic.  Neck:     Musculoskeletal: Normal range of motion.  Cardiovascular:     Rate and Rhythm: Normal rate and regular rhythm.  Pulmonary:     Effort: Pulmonary effort is normal. No respiratory distress.     Breath sounds: Normal breath sounds. No wheezing or rales.  Abdominal:     General: Bowel sounds are normal. There is no distension.     Palpations: Abdomen is soft.     Tenderness: There is no abdominal tenderness. There is no rebound.  Skin:    General: Skin is warm and dry.   Neurological:     Mental Status: She is alert and oriented to person, place, and time.     Coordination: Coordination normal.     Comments: Motorized scooter for ambulation     Vitals:   02/07/19 1339  BP: 140/80  Pulse: 71  Temp: 97.6 F (36.4 C)  TempSrc: Oral  SpO2: 96%  Weight: 179 lb 12.8 oz (81.6 kg)  Height: 4\' 11"  (1.499 m)    Assessment & Plan:

## 2019-02-07 NOTE — Assessment & Plan Note (Signed)
Sugar readings from hospital 24 hours post decadron are fairly stable from prior. Rx for freestyle libre monitoring to watch sugars over the next months during treatment. If needed we talked about insulin to help with steroids if needed. It is not time for HgA1c. If concern for level of control soonest check to look at this would be fructosamine around late March to accurately check sugars while on decadron.

## 2019-02-07 NOTE — Patient Instructions (Addendum)
We have sent in the sugar monitor.   Call us or mychart message to let us know if the sugars get 250 or above consistently.

## 2019-02-08 ENCOUNTER — Telehealth: Payer: Self-pay | Admitting: *Deleted

## 2019-02-08 ENCOUNTER — Ambulatory Visit: Payer: Medicare Other | Admitting: Hematology

## 2019-02-08 ENCOUNTER — Ambulatory Visit: Payer: Medicare Other

## 2019-02-08 ENCOUNTER — Other Ambulatory Visit: Payer: Self-pay | Admitting: *Deleted

## 2019-02-08 ENCOUNTER — Other Ambulatory Visit: Payer: Medicare Other

## 2019-02-08 DIAGNOSIS — C9000 Multiple myeloma not having achieved remission: Secondary | ICD-10-CM

## 2019-02-08 NOTE — Assessment & Plan Note (Signed)
Suspect that the fever was medication induced and they will discuss with oncology prior to next infusion if any adjustments needed. They did pre-treat with decadron.

## 2019-02-08 NOTE — Telephone Encounter (Signed)
Contacted patient to ask if symptoms have subsided from the weekend. She states she is tired, but feels better. Advised her that Dr.Kale will see her tomorrow while she is in infusion - she verbalized understanding.

## 2019-02-09 ENCOUNTER — Inpatient Hospital Stay: Payer: Medicare Other

## 2019-02-09 ENCOUNTER — Telehealth: Payer: Self-pay | Admitting: Hematology

## 2019-02-09 ENCOUNTER — Inpatient Hospital Stay: Payer: Medicare Other | Admitting: Hematology

## 2019-02-09 VITALS — BP 136/76 | HR 82 | Temp 98.1°F | Resp 18

## 2019-02-09 DIAGNOSIS — C9000 Multiple myeloma not having achieved remission: Secondary | ICD-10-CM

## 2019-02-09 DIAGNOSIS — C7951 Secondary malignant neoplasm of bone: Secondary | ICD-10-CM

## 2019-02-09 DIAGNOSIS — Z5112 Encounter for antineoplastic immunotherapy: Secondary | ICD-10-CM | POA: Diagnosis not present

## 2019-02-09 DIAGNOSIS — Z7189 Other specified counseling: Secondary | ICD-10-CM

## 2019-02-09 DIAGNOSIS — E119 Type 2 diabetes mellitus without complications: Secondary | ICD-10-CM | POA: Diagnosis not present

## 2019-02-09 LAB — CMP (CANCER CENTER ONLY)
ALT: 62 U/L — ABNORMAL HIGH (ref 0–44)
AST: 50 U/L — ABNORMAL HIGH (ref 15–41)
Albumin: 3.5 g/dL (ref 3.5–5.0)
Alkaline Phosphatase: 106 U/L (ref 38–126)
Anion gap: 13 (ref 5–15)
BUN: 15 mg/dL (ref 8–23)
CHLORIDE: 103 mmol/L (ref 98–111)
CO2: 22 mmol/L (ref 22–32)
CREATININE: 0.69 mg/dL (ref 0.44–1.00)
Calcium: 10.2 mg/dL (ref 8.9–10.3)
GFR, Est AFR Am: >60 mL/min (ref >60)
Glucose, Bld: 177 mg/dL — ABNORMAL HIGH (ref 70–99)
Potassium: 4.1 mmol/L (ref 3.5–5.1)
Sodium: 138 mmol/L (ref 135–145)
Total Bilirubin: 0.7 mg/dL (ref 0.3–1.2)
Total Protein: 6.5 g/dL (ref 6.5–8.1)

## 2019-02-09 LAB — CBC WITH DIFFERENTIAL (CANCER CENTER ONLY)
Abs Immature Granulocytes: 0.02 10*3/uL (ref 0.00–0.07)
Basophils Absolute: 0 10*3/uL (ref 0.0–0.1)
Basophils Relative: 0 %
Eosinophils Absolute: 0.1 10*3/uL (ref 0.0–0.5)
Eosinophils Relative: 1 %
HCT: 37.7 % (ref 36.0–46.0)
Hemoglobin: 12.6 g/dL (ref 12.0–15.0)
IMMATURE GRANULOCYTES: 0 %
Lymphocytes Relative: 27 %
Lymphs Abs: 1.6 10*3/uL (ref 0.7–4.0)
MCH: 30.8 pg (ref 26.0–34.0)
MCHC: 33.4 g/dL (ref 30.0–36.0)
MCV: 92.2 fL (ref 80.0–100.0)
Monocytes Absolute: 0.9 10*3/uL (ref 0.1–1.0)
Monocytes Relative: 14 %
NEUTROS ABS: 3.4 10*3/uL (ref 1.7–7.7)
Neutrophils Relative %: 58 %
Platelet Count: 192 10*3/uL (ref 150–400)
RBC: 4.09 MIL/uL (ref 3.87–5.11)
RDW: 12.4 % (ref 11.5–15.5)
WBC Count: 6 10*3/uL (ref 4.0–10.5)
nRBC: 0 % (ref 0.0–0.2)

## 2019-02-09 MED ORDER — SODIUM CHLORIDE 0.9 % IV SOLN
Freq: Once | INTRAVENOUS | Status: AC
  Administered 2019-02-09: 15:00:00 via INTRAVENOUS
  Filled 2019-02-09: qty 250

## 2019-02-09 MED ORDER — METHYLPREDNISOLONE SODIUM SUCC 40 MG IJ SOLR
INTRAMUSCULAR | Status: AC
  Filled 2019-02-09: qty 1

## 2019-02-09 MED ORDER — METHYLPREDNISOLONE SODIUM SUCC 40 MG IJ SOLR
40.0000 mg | Freq: Once | INTRAMUSCULAR | Status: AC
  Administered 2019-02-09: 40 mg via INTRAVENOUS

## 2019-02-09 MED ORDER — PROCHLORPERAZINE MALEATE 10 MG PO TABS
ORAL_TABLET | ORAL | Status: AC
  Filled 2019-02-09: qty 1

## 2019-02-09 MED ORDER — ACETAMINOPHEN 325 MG PO TABS
ORAL_TABLET | ORAL | Status: AC
  Filled 2019-02-09: qty 2

## 2019-02-09 MED ORDER — PROCHLORPERAZINE MALEATE 10 MG PO TABS
10.0000 mg | ORAL_TABLET | Freq: Once | ORAL | Status: AC
  Administered 2019-02-09: 10 mg via ORAL

## 2019-02-09 MED ORDER — SODIUM CHLORIDE 0.9 % IV SOLN
Freq: Once | INTRAVENOUS | Status: DC
  Filled 2019-02-09: qty 250

## 2019-02-09 MED ORDER — DEXTROSE 5 % IV SOLN
27.0000 mg/m2 | Freq: Once | INTRAVENOUS | Status: AC
  Administered 2019-02-09: 50 mg via INTRAVENOUS
  Filled 2019-02-09: qty 10

## 2019-02-09 MED ORDER — ACETAMINOPHEN 325 MG PO TABS
650.0000 mg | ORAL_TABLET | Freq: Once | ORAL | Status: AC
  Administered 2019-02-09: 650 mg via ORAL

## 2019-02-09 NOTE — Telephone Encounter (Signed)
No los per 02/27.

## 2019-02-09 NOTE — Patient Instructions (Signed)
[facility] Discharge Instructions for Patients Receiving Chemotherapy  Today you received the following chemotherapy agents:  Kyprolis  To help prevent nausea and vomiting after your treatment, we encourage you to take your nausea medication as directed.   If you develop nausea and vomiting that is not controlled by your nausea medication, call the clinic.   BELOW ARE SYMPTOMS THAT SHOULD BE REPORTED IMMEDIATELY:  *FEVER GREATER THAN 100.5 F  *CHILLS WITH OR WITHOUT FEVER  NAUSEA AND VOMITING THAT IS NOT CONTROLLED WITH YOUR NAUSEA MEDICATION  *UNUSUAL SHORTNESS OF BREATH  *UNUSUAL BRUISING OR BLEEDING  TENDERNESS IN MOUTH AND THROAT WITH OR WITHOUT PRESENCE OF ULCERS  *URINARY PROBLEMS  *BOWEL PROBLEMS  UNUSUAL RASH Items with * indicate a potential emergency and should be followed up as soon as possible.  Feel free to call the clinic should you have any questions or concerns. The clinic phone number is [phone].  Please show the CHEMO ALERT CARD at check-in to the Emergency Department and triage nurse.   

## 2019-02-09 NOTE — Progress Notes (Signed)
HEMATOLOGY/ONCOLOGY CLINIC NOTE  Date of Service: 02/09/2019  Patient Care Team: Hoyt Koch, MD as PCP - General (Internal Medicine) Lafayette Dragon, MD (Inactive) as Consulting Physician (Gastroenterology) Megan Salon, MD as Consulting Physician (Gynecology) Melrose Nakayama, MD as Consulting Physician (Orthopedic Surgery) Deneise Lever, MD as Consulting Physician (Pulmonary Disease) [name], MD (Ophthalmology)  CHIEF COMPLAINTS/PURPOSE OF CONSULTATION:  myeloma  HISTORY OF PRESENTING ILLNESS:   Faith Orr is a wonderful 78 y.o. female who has been referred to [name] by Dr. Pricilla Holm for evaluation and management of Lytic bone lesions. She is accompanied today by her son in law, and her partner is present via phone. The pt reports that she is doing well overall.   The pt notes that 2-3 months ago while standing at the stove, and otherwise feeling normally, she felt "something snap that took her breath away" in her mid back as she stretched to get something. The pt notes that she saw a chiropractor twice due to her back stiffness, which did not help. The pt then described her new bone pains to her PCP on 12/05/18, and subsequent imaging, as noted below, revealed concerns for numerous bone lesions. She has begun 19m Fosamax. The pt notes that she had hip pain in 2018, and that an XR at that time did not reveal any lesions.  The pt notes that most of her pain is concentrated to her left shoulder presently, and with minimal movement of the arm. She endorses pain radiating into her left arm and notes that her hand is swollen in the mornings when she wakes up. She also endorses present back pain and right hip pain, worse when she walks. She has not yet seen orthopedics. The pt reports that she is needing to take 6073mAdvil every 4-6 hours as Tramadol alone has not been able to alleviate her pain. The pt denies any unexpected weight loss, fevers, chills, or night  sweats. The pt notes that her urine is a very dark color presently, but denies overt blood in the urine, underpants, nor tissue paper. The pt notes that in the last two weeks she has had some soreness in her head, but denies new headaches or changes in vision. The pt notes that she has been urinating more frequently overall, but has been trying to stay better hydrated as well.  The pt notes that she has been compliant with annual mammograms.   The pt notes that she had a cyst in her right breast which was removed in the past. She fractured her left wrist in 2008 after falling down stairs. The pt endorses history of fatty liver.   The pt denies ever smoking cigarettes and endorses significant second hand smoke exposure with a previous marriage.   Of note prior to the patient's visit today, pt has had a Bone Scan completed on 12/13/18 with results revealing Multifocal uptake throughout the skeleton, consistent with diffuse metastatic disease. Primary tumor is not specified. 2. Uptake in the proximal right femur, consistent with lytic lesions. 3. Uptake in the ribs bilaterally as described. 4. Lesions in the proximal left humerus. 5. Diffuse uptake throughout the skull consistent with metastatic disease. 6. Right paramedian uptake at the manubrium.  Most recent lab results (12/08/18) of CBC w/diff and CMP is as follows: all values are WNL except for Glucose at 279, BUN at 24, AST at 41, ALT at 46. 12/08/18 SPEP revealed all values WNL except for Total Protein at 6.0,  Albumin at 3.6, Gamma globulin at 0.7, and M spike at 0.5g  On review of systems, pt reports significant left shoulder pain, back pain, right hip pain, dark urine, and denies fevers, chills, night sweats, unexpected weight loss, changes in bowel habits, changes in breathing, cough, new respiratory symptoms, changes in vision, abdominal pains, leg swelling, and any other symptoms.   On PMHx the pt reports fatty liver, and denies blood clots.    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 Denies ever smoking.  On Family Hx the pt reports maternal grandmother with colon cancer. Father with bladder cancer and amyloidosis (pt notes that this could have been misdiagnosis). Mother with Protein S deficiency and polymyalgia rheumatica.  Interval History:   ADALIE MAND returns today for management and evaluation of her newly diagnosed Multiple myeloma and C1D8 treatment. The patient's last visit with [name] was on 02/02/19. The pt reports that she is doing well overall.   The pt did not have a fever after taking dexamethasone with her first infusion last week, but did not take dexamethasone prior to the D2 infusion and did develop a fever. She presented to the ED for this. The pt denies having repeated fevers or chills, or sensations of flushing.  The pt reports that her pain is much better controlled since beginning the fentanyl patch. She notes that she has reduced her Oxycodone to once a day.  The pt notes that her sugars have been running in the 200s on the days that she has taken steroids.  Lab results today (02/09/19) of CBC w/diff and CMP is as follows: all values are WNL except for Glucose at 177, AST at 50, ALT at 62.  On review of systems, pt reports controlled pain, stable energy levels, fever last week, and denies repeated fevers, abdominal pains, leg swelling, and any other symptoms.    MEDICAL HISTORY:  Past Medical History:  Diagnosis Date  . Allergy    seasonal  . Asthma   . DEPRESSION   . DIABETES MELLITUS, TYPE II   . Diverticulosis   . HYPERLIPIDEMIA   . Macular degeneration of left eye    mild, Dr.Hecker  . Obesity, unspecified   . Osteoarthritis of both knees   . OSTEOPENIA   . Osteopenia   . URINARY INCONTINENCE     SURGICAL HISTORY: Past Surgical History:  Procedure Laterality Date  . BREAST SURGERY    . CATARACT EXTRACTION Left 05/24/2018  . CESAREAN SECTION   01/1973  . FRACTURE SURGERY    . left wrist surgery  2008   By Dr. Latanya Maudlin  . right ankle  1994    SOCIAL HISTORY: Social History   Socioeconomic History  . Marital status: Married    Spouse name: Not on file  . Number of children: 1  . Years of education: Not on file  . Highest education level: Not on file  Occupational History    Employer: [employer]  Social Needs  . Financial resource strain: Patient refused  . Food insecurity:    Worry: Patient refused    Inability: Patient refused  . Transportation needs:    Medical: Patient refused    Non-medical: Patient refused  Tobacco Use  . Smoking status: Never Smoker  . Smokeless tobacco: Never Used  . Tobacco comment: Lives with partner [name]) and son  Substance and Sexual Activity  . Alcohol use: No    Alcohol/week: 0.0  standard drinks  . Drug use: No  . Sexual activity: Never    Partners: Female    Birth control/protection: Post-menopausal    Comment: Lives with female partner (annette hicks) and 108 yo son  Lifestyle  . Physical activity:    Days per week: Patient refused    Minutes per session: Patient refused  . Stress: Patient refused  Relationships  . Social connections:    Talks on phone: Patient refused    Gets together: Patient refused    Attends religious service: Patient refused    Active member of club or organization: Patient refused    Attends meetings of clubs or organizations: Patient refused    Relationship status: Patient refused  . Intimate partner violence:    Fear of current or ex partner: Patient refused    Emotionally abused: Patient refused    Physically abused: Patient refused    Forced sexual activity: Patient refused  Other Topics Concern  . Not on file  Social History Narrative  . Not on file    FAMILY HISTORY: Family History  Problem Relation Age of Onset  . Diabetes Father   . Hyperlipidemia Father   . Heart disease Father   . Cancer Father   .  Hypertension Father   . Colon cancer Paternal Grandmother 48  . Osteoporosis Mother   . Protein S deficiency Mother   . Hyperlipidemia Mother   . Multiple sclerosis Daughter   . Cancer Other        bladder  . Breast cancer Neg Hx     ALLERGIES:  is allergic to penicillins; aleve [naproxen sodium]; and sulfonamide derivatives.  MEDICATIONS:  Current Outpatient Medications  Medication Sig Dispense Refill  . acyclovir (ZOVIRAX) 400 MG tablet Take 1 tablet (400 mg total) by mouth 2 (two) times daily. 60 tablet 3  . albuterol (PROVENTIL HFA;VENTOLIN HFA) 108 (90 Base) MCG/ACT inhaler INHALE 1 TO 2 PUFFS INTO THE LUNGS EVERY 6 HOURS AS NEEDED FOR WHEEZING OR SHORTNESS OF BREATH (Patient not taking: No sig reported) 54 g 1  . aspirin EC 81 MG tablet Take 81 mg by mouth daily after breakfast.     . Blood Glucose Monitoring Suppl (FREESTYLE FREEDOM LITE) W/DEVICE KIT Use to check blood sugars twice a day Dx 250.00 1 each 0  . Calcium Carbonate-Vitamin D (CALCIUM 600+D HIGH POTENCY) 600-400 MG-UNIT per tablet Take 1 tablet by mouth 2 (two) times daily.     . Continuous Blood Gluc Sensor (FREESTYLE LIBRE 14 DAY SENSOR) MISC 1 each by Does not apply route every 14 (fourteen) days. 2 each 11  . dexamethasone (DECADRON) 4 MG tablet Take 5 tablets (20 mg total) by mouth once a week. On D1,8 and 15 of each cycle of treatment (Patient taking differently: Take 20 mg by mouth See admin instructions. On Day 1,8 and 15 of each cycle of treatment) 20 tablet 5  . fentaNYL (DURAGESIC) 12 MCG/HR Place 1 patch onto the skin every 3 (three) days. 10 patch 0  . fluticasone (FLONASE) 50 MCG/ACT nasal spray Place 1 spray into both nostrils daily. (Patient taking differently: Place 1 spray into both nostrils daily as needed for allergies or rhinitis. ) 16 g 2  . glucose blood (FREESTYLE LITE) test strip CHECK BLOOD SUGAR TWICE DAILY AS DIRECTED Dx 250.00 180 each 3  . Lancets (FREESTYLE) lancets Use twice daily to  check sugars. 100 each 11  . lenalidomide (REVLIMID) 15 MG capsule Take 1 capsule (15 mg total)  by mouth daily. Take for 21 days on, 7 days off, repeat every 28 days. [employer] [phone] 02/03/19 (Patient not taking: Reported on 02/07/2019) 21 capsule 0  . metFORMIN (GLUCOPHAGE-XR) 500 MG 24 hr tablet Take 3 tablets (1,500 mg total) by mouth daily with breakfast. (Patient taking differently: Take 500-1,000 mg by mouth See admin instructions. 500 mg every morning, 1,000 mg every night) 270 tablet 3  . montelukast (SINGULAIR) 10 MG tablet TAKE 1 TABLET BY MOUTH DAILY AS NEEDED (Patient taking differently: Take 10 mg by mouth at bedtime as needed (allergies). ) 30 tablet 3  . Multiple Vitamins-Minerals (ICAPS) CAPS Take 1 capsule by mouth daily after breakfast.     . ondansetron (ZOFRAN) 8 MG tablet Take 1 tablet (8 mg total) by mouth 2 (two) times daily as needed (Nausea or vomiting). 30 tablet 1  . oxyCODONE 10 MG TABS Take 1 tablet (10 mg total) by mouth every 6 (six) hours as needed for moderate pain or severe pain. 90 tablet 0  . pantoprazole (PROTONIX) 20 MG tablet TAKE 1 TABLET(20 MG) BY MOUTH DAILY (Patient not taking: No sig reported) 30 tablet 5  . polyethylene glycol (MIRALAX / GLYCOLAX) packet Take 17 g by mouth daily after breakfast.     . prochlorperazine (COMPAZINE) 10 MG tablet Take 1 tablet (10 mg total) by mouth every 6 (six) hours as needed (Nausea or vomiting). 30 tablet 1  . senna-docusate (SENNA S) 8.6-50 MG tablet Take 2 tablets by mouth at bedtime. 60 tablet 1  . sertraline (ZOLOFT) 50 MG tablet TAKE 1 TABLET BY MOUTH DAILY (Patient taking differently: Take 50 mg by mouth at bedtime. ) 90 tablet 1  . simvastatin (ZOCOR) 20 MG tablet TAKE 1 TABLET(20 MG) BY MOUTH DAILY (Patient taking differently: Take 20 mg by mouth every evening. ) 90 tablet 1  . Vitamin D, Ergocalciferol, (DRISDOL) 1.25 MG (50000 UT) CAPS capsule Take 1 capsule (50,000 Units total) by mouth every 7 (seven) days. 12  capsule 0   No current facility-administered medications for this visit.    Facility-Administered Medications Ordered in Other Visits  Medication Dose Route Frequency Provider Last Rate Last Dose  . 0.9 %  sodium chloride infusion   Intravenous Once Brunetta Genera, MD      . carfilzomib (KYPROLIS) 50 mg in dextrose 5 % 100 mL chemo infusion  27 mg/m2 (Treatment Plan Recorded) Intravenous Once Brunetta Genera, MD        REVIEW OF SYSTEMS:    A 10+ POINT REVIEW OF SYSTEMS WAS OBTAINED including neurology, dermatology, psychiatry, cardiac, respiratory, lymph, extremities, GI, GU, Musculoskeletal, constitutional, breasts, reproductive, HEENT.  All pertinent positives are noted in the HPI.  All others are negative.   PHYSICAL EXAMINATION: ECOG PERFORMANCE STATUS: 2 - Symptomatic, <50% confined to bed  . There were no vitals filed for this visit. There were no vitals filed for this visit. .There is no height or weight on file to calculate BMI.  GENERAL:alert, in no acute distress and comfortable SKIN: no acute rashes, no significant lesions EYES: conjunctiva are pink and non-injected, sclera anicteric OROPHARYNX: MMM, no exudates, no oropharyngeal erythema or ulceration NECK: supple, no JVD LYMPH:  no palpable lymphadenopathy in the cervical, axillary or inguinal regions LUNGS: clear to auscultation b/l with normal respiratory effort HEART: regular rate & rhythm ABDOMEN:  normoactive bowel sounds , non tender, not distended. No palpable hepatosplenomegaly.  Extremity: no pedal edema PSYCH: alert & oriented x 3 with fluent speech  NEURO: no focal motor/sensory deficits   LABORATORY DATA:  I have reviewed the data as listed  . CBC Latest Ref Rng & Units 02/09/2019 02/05/2019 02/04/2019  WBC 4.0 - 10.5 K/uL 6.0 4.1 4.8  Hemoglobin 12.0 - 15.0 g/dL 12.6 10.7(L) 11.5(L)  Hematocrit 36.0 - 46.0 % 37.7 33.1(L) 34.2(L)  Platelets 150 - 400 K/uL 192 136(L) 144(L)   . CBC      Component Value Date/Time   WBC 6.0 02/09/2019 1337   WBC 4.1 02/05/2019 0435   RBC 4.09 02/09/2019 1337   HGB 12.6 02/09/2019 1337   HCT 37.7 02/09/2019 1337   PLT 192 02/09/2019 1337   MCV 92.2 02/09/2019 1337   MCH 30.8 02/09/2019 1337   MCHC 33.4 02/09/2019 1337   RDW 12.4 02/09/2019 1337   LYMPHSABS 1.6 02/09/2019 1337   MONOABS 0.9 02/09/2019 1337   EOSABS 0.1 02/09/2019 1337   BASOSABS 0.0 02/09/2019 1337    . CMP Latest Ref Rng & Units 02/09/2019 02/05/2019 02/04/2019  Glucose 70 - 99 mg/dL 177(H) 242(H) 206(H)  BUN 8 - 23 mg/dL _0 Creatinine 0.44 - 1.00 mg/dL 0.69 0.67 0.60  Sodium 135 - 145 mmol/L 138 138 130(L)  Potassium 3.5 - 5.1 mmol/L 4.1 4.1 3.7  Chloride 98 - 111 mmol/L 103 110 101  CO2 22 - 32 mmol/L 22 21(L) 22  Calcium 8.9 - 10.3 mg/dL 10.2 8.8(L) 9.1  Total Protein 6.5 - 8.1 g/dL 6.5 - 6.2(L)  Total Bilirubin 0.3 - 1.2 mg/dL 0.7 - 0.6  Alkaline Phos 38 - 126 U/L 106 - 97  AST 15 - 41 U/L 50(H) - 64(H)  ALT 0 - 44 U/L 62(H) - 61(H)   Component     Latest Ref Rng & Units 12/27/2018  IgG (Immunoglobin G), Serum     700 - 1,600 mg/dL 801  IgA     64 - 422 mg/dL 91  IgM (Immunoglobulin M), Srm     26 - 217 mg/dL 15 (L)  Total Protein ELP     6.0 - 8.5 g/dL 6.6  Albumin SerPl Elph-Mcnc     2.9 - 4.4 g/dL 3.7  Alpha 1     0.0 - 0.4 g/dL 0.2  Alpha2 Glob SerPl Elph-Mcnc     0.4 - 1.0 g/dL 0.8  B-Globulin SerPl Elph-Mcnc     0.7 - 1.3 g/dL 1.2  Gamma Glob SerPl Elph-Mcnc     0.4 - 1.8 g/dL 0.7  M Protein SerPl Elph-Mcnc     Not Observed g/dL 0.5 (H)  Globulin, Total     2.2 - 3.9 g/dL 2.9  Albumin/Glob SerPl     0.7 - 1.7 1.3  IFE 1      Comment  Please Note (HCV):      Comment  Kappa free light chain     3.3 - 19.4 mg/L 5.1  Lamda free light chains     5.7 - 26.3 mg/L 40.3 (H)  Kappa, lamda light chain ratio     0.26 - 1.65 0.13 (L)  Beta-2 Microglobulin     0.6 - 2.4 mg/L 1.7  LDH     98 - 192 U/L 162  Sed Rate     0 - 22  mm/hr 8       01/06/19 Cytogenetics:       RADIOGRAPHIC STUDIES: I have personally reviewed the radiological images as listed and agreed with the findings in the report. Dg Chest 2 View  Result  Date: 02/04/2019 CLINICAL DATA:  Fever, chills EXAM: CHEST - 2 VIEW COMPARISON:  10/14/2015 FINDINGS: Heart is borderline in size. Linear atelectasis or scarring in the lingula. No confluent airspace opacities or effusions. No acute bony abnormality. IMPRESSION: Lingular atelectasis or scarring. Borderline heart size. No active disease. Electronically Signed   By: Rolm Baptise M.D.   On: 02/04/2019 23:25   Mr Liver W Wo Contrast  Result Date: 01/18/2019 CLINICAL DATA:  Multiple myeloma, hepatic activity heterogeneity on PET-CT for further investigation. EXAM: MRI ABDOMEN WITHOUT AND WITH CONTRAST TECHNIQUE: Multiplanar multisequence MR imaging of the abdomen was performed both before and after the administration of intravenous contrast. CONTRAST:  8 cc Gadavist COMPARISON:  01/05/2019 FINDINGS: Lower chest: Mild cardiomegaly. Hepatobiliary: Diffuse struck bout of signal in the liver compatible with hepatic steatosis. In segment 7 of the liver, a 1.2 by 1.1 cm T2 hyperintense lesion is observed without associated enhancement, compatible with a hepatic cyst. In the lateral segment left hepatic lobe, a 0.6 cm and separate 0.3 cm T2 hyperintense lesions are observed, both shown on image 37/11, neither enhancing and compatible with cysts. Peripheral wedge-shaped 2.3 by 2.1 cm focus of initial hypoenhancement but subsequent delayed accentuated enhancement on late phase images was not previously hypermetabolic. This has mildly accentuated T2 signal characteristics and overall is thought to likely represent focal scarring., less likely atypical hemangioma. In the right hepatic lobe on image 24/1501 there is a small focus of arterial phase enhancement which appears very similar to the blush of enhancement shown on  CT abdomen of 05/22/2011. This is fairly poorly seen on later phase images, with only some equivocal linear enhancement in the area, and could be from an unusual hemangioma/vascular malformation or more likely, focal nodular hyperplasia. No worrisome lesions to correlate with the heterogeneity seen on MRI. No biliary dilatation. Pancreas:  Unremarkable Spleen:  Unremarkable Adrenals/Urinary Tract: There are 2 tiny left renal cysts. Adrenal glands normal. Stomach/Bowel: Unremarkable Vascular/Lymphatic:  Aortoiliac atherosclerotic vascular disease. Other:  No supplemental non-categorized findings. Musculoskeletal: Scattered bony lesions are observed including a 2.8 by 1.8 cm lesion in the left side of the T9 vertebral body, corresponding to the patient's known myeloma/lytic lesions. IMPRESSION: 1. Several appreciable liver lesions all have benign imaging characteristics. No MRI findings of metastatic involvement of the liver. 2. Scattered bony lesions corresponding to the lytic lesions seen at PET-CT, compatible with active myeloma. 3.  Aortic Atherosclerosis (ICD10-I70.0).  Mild cardiomegaly. 4. Diffuse hepatic steatosis. Electronically Signed   By: Van Clines M.D.   On: 01/18/2019 09:16    ASSESSMENT & PLAN:   78 y.o. female with  1. Newly diagnosed Multiple Myeloma, RISS Stage III  Labs upon initial presentation from 12/08/18, blood counts are normal including WBC at 7.1k, HGB at 13.1, and PLT at 245k. Calcium normal at 10.3. Creatinine normal at 0.63. M spike at 0.5g. 12/13/18 Bone Scan revealed Multifocal uptake throughout the skeleton, consistent with diffuse metastatic disease. Primary tumor is not specified. 2. Uptake in the proximal right femur, consistent with lytic lesions. 3. Uptake in the ribs bilaterally as described. 4. Lesions in the proximal left humerus. 5. Diffuse uptake throughout the skull consistent with metastatic disease. 6. Right paramedian uptake at the  manubrium.  12/13/18 CT Right Femur revealed Numerous lytic lesions involving the right femur and a lytic lesion in the left inferior pubic ramus. Overall appearance is most concerning for multiple myeloma  12/27/18 Pretreatment 24hour UPEP observed an M spike at 22m, and showed  165m total protein/day.  12/27/18 Pretreatment MMP revealed M Protein at 0.5g with IgG Lambda specificity. Kappa:Lambda light chain ratio at 0.13, with Lambda at 40.3. 01/05/19 PET/CT revealed Innumerable lytic lesions in the skeleton compatible with myeloma. Most of the larger lesions are hypermetabolic, for example including a left proximal humeral shaft lesion with maximum SUV of 8.1 and a 2.8 cm lesion in the left T9 vertebral body with maximum SUV 5.1. Most of the smaller lytic lesions, and some of the larger lesions, do not demonstrate accentuated metabolic activity. 2. 1.2 cm in short axis lymph node in the left parapharyngeal space is hypermetabolic with maximum SUV 11.8. I do not see a separate mass in the head and neck to give rise to this hypermetabolic lymph node. 3. Mosaic attenuation in the lower lobes, nonspecific possibly from air trapping. 4.  Aortic Atherosclerosis 5. Heterogeneous activity in the liver, making it hard to exclude small liver lesions. Consider hepatic protocol MRI with and without contrast for definitive assessment. Nonobstructive right nephrolithiasis. Old granulomatous disease  01/06/19 Bone Marrow biopsy revealed interstitial increase in plasma cells (28% aspirate, 40% CD138 immunohistochemistry). Plasma cells negative for light chains consistent with a non or weakly secretory myeloma   01/06/19 Cytogenetics revealed 37% of cells with trisomy 11 or 11q deletion, and 40.5% of cells with 17p mutation  2. 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  01/17/19 MRI Liver revealed Several appreciable liver  lesions all have benign imaging characteristics. No MRI findings of metastatic involvement of the liver. 2. Scattered bony lesions corresponding to the lytic lesions seen at PET-CT, compatible with active myeloma. 3. Aortic Atherosclerosis.  Mild cardiomegaly. 4. Diffuse hepatic steatosis.   PLAN -Discussed pt labwork today, 02/09/19; blood counts are normal, chemistries are stable -Adjusting premedications to add Tylenol and will split dose of Dexamethasone with pre-meds and pt will not take Decadron at home except on D22 -The pt has no prohibitive toxicities from continuing C1D8 Carfilzomib and Dexamethasone at this time. -The pt has no prohibitive toxicities from beginning Revlimid at this time. -Reviewed treatment dosing and scheduling again with the pt to ensure clarity -Okay to take Tylenol at home if feeling flushed, however pt will seek medical attention if fevers rise above 100.4 or repeat in frequency -Continue follow up with PCP Dr. EPricilla Holmfor management and evaluation of her DM, especially while on treatment with steroids as blood sugars will be higher -Previously discussed that there is less abnormal protein and light chains than I would expect from 30% plasma cells, which suggests hypo-secretory or non-secretory neoplastic plasma cells. Will have an impact in assessing response. -Continue Zometa -Continue Acyclovir and 824mAspirin to mitigate risk of Shingles and blood clots respectively -Will watch lymph node in left parapharyngeal space seen on PET/CT -Follow up [facility] for possible prophylactic interventions to mitigate risk of pathologic fractures -Avoid lifting, pulling, pushing with left upper extremity  -Avoid high impact activities and stairs to preserve right hip as well -May be a role for local radiation therapy for pain control at T9 and left shoulder -Continue Fentanyl patch and have increased to  102mxycodone for break through pain -Zofran for opiate-related nausea -Miralax once a day, and Senna S two tablets BID, back off as constipation resolves -Will see the pt back in on 02/16/19 with C1D15   RTC with Dr KalIrene Limbo  C1D15 (02/16/2019) Labs on D1,D8 and D15 of each cycle IR referral for port a cath placement   All of the patients questions were answered with apparent satisfaction. The patient knows to call the clinic with any problems, questions or concerns.  The total time spent in the appt was 25 minutes and more than 50% was on counseling and direct patient cares.    Sullivan Lone MD MS AAHIVMS [facility] Hematology/Oncology Physician [facility]  (Office):       [phone] (Work cell):  [phone] (Fax):           [phone]  02/09/2019 3:53 PM  I, Baldwin Jamaica, am acting as a scribe for Dr. Sullivan Lone.   .I have reviewed the above documentation for accuracy and completeness, and I agree with the above. Brunetta Genera MD

## 2019-02-10 ENCOUNTER — Inpatient Hospital Stay: Payer: Medicare Other

## 2019-02-10 VITALS — BP 134/79 | HR 75 | Temp 97.9°F | Resp 18

## 2019-02-10 DIAGNOSIS — Z5112 Encounter for antineoplastic immunotherapy: Secondary | ICD-10-CM | POA: Diagnosis not present

## 2019-02-10 DIAGNOSIS — Z7189 Other specified counseling: Secondary | ICD-10-CM

## 2019-02-10 DIAGNOSIS — C9000 Multiple myeloma not having achieved remission: Secondary | ICD-10-CM

## 2019-02-10 LAB — CULTURE, BLOOD (ROUTINE X 2)
Culture: NO GROWTH
Culture: NO GROWTH

## 2019-02-10 MED ORDER — METHYLPREDNISOLONE SODIUM SUCC 40 MG IJ SOLR
40.0000 mg | Freq: Once | INTRAMUSCULAR | Status: AC
  Administered 2019-02-10: 40 mg via INTRAVENOUS

## 2019-02-10 MED ORDER — DIPHENHYDRAMINE HCL 50 MG/ML IJ SOLN
INTRAMUSCULAR | Status: AC
  Filled 2019-02-10: qty 1

## 2019-02-10 MED ORDER — PROCHLORPERAZINE MALEATE 10 MG PO TABS
10.0000 mg | ORAL_TABLET | Freq: Once | ORAL | Status: AC
  Administered 2019-02-10: 10 mg via ORAL

## 2019-02-10 MED ORDER — DIPHENHYDRAMINE HCL 50 MG/ML IJ SOLN
25.0000 mg | Freq: Once | INTRAMUSCULAR | Status: AC
  Administered 2019-02-10: 25 mg via INTRAVENOUS

## 2019-02-10 MED ORDER — PROCHLORPERAZINE MALEATE 10 MG PO TABS
ORAL_TABLET | ORAL | Status: AC
  Filled 2019-02-10: qty 1

## 2019-02-10 MED ORDER — METHYLPREDNISOLONE SODIUM SUCC 125 MG IJ SOLR
80.0000 mg | Freq: Once | INTRAMUSCULAR | Status: AC
  Administered 2019-02-10: 80 mg via INTRAVENOUS

## 2019-02-10 MED ORDER — SODIUM CHLORIDE 0.9 % IV SOLN
Freq: Once | INTRAVENOUS | Status: AC
  Filled 2019-02-10: qty 250

## 2019-02-10 MED ORDER — ACETAMINOPHEN 325 MG PO TABS
650.0000 mg | ORAL_TABLET | Freq: Once | ORAL | Status: AC
  Administered 2019-02-10: 650 mg via ORAL

## 2019-02-10 MED ORDER — SODIUM CHLORIDE 0.9 % IV SOLN
Freq: Once | INTRAVENOUS | Status: AC
  Administered 2019-02-10: 13:00:00 via INTRAVENOUS
  Filled 2019-02-10: qty 250

## 2019-02-10 MED ORDER — METHYLPREDNISOLONE SODIUM SUCC 40 MG IJ SOLR
INTRAMUSCULAR | Status: AC
  Filled 2019-02-10: qty 1

## 2019-02-10 MED ORDER — FAMOTIDINE IN NACL 20-0.9 MG/50ML-% IV SOLN
20.0000 mg | Freq: Once | INTRAVENOUS | Status: AC
  Administered 2019-02-10: 20 mg via INTRAVENOUS

## 2019-02-10 MED ORDER — ACETAMINOPHEN 325 MG PO TABS
ORAL_TABLET | ORAL | Status: AC
  Filled 2019-02-10: qty 2

## 2019-02-10 MED ORDER — DEXTROSE 5 % IV SOLN
27.0000 mg/m2 | Freq: Once | INTRAVENOUS | Status: AC
  Administered 2019-02-10: 50 mg via INTRAVENOUS
  Filled 2019-02-10: qty 10

## 2019-02-10 NOTE — Patient Instructions (Signed)
[facility] Discharge Instructions for Patients Receiving Chemotherapy  Today you received the following chemotherapy agents: Carfilzomib (Kyprolis)  To help prevent nausea and vomiting after your treatment, we encourage you to take your nausea medication as directed.    If you develop nausea and vomiting that is not controlled by your nausea medication, call the clinic.   BELOW ARE SYMPTOMS THAT SHOULD BE REPORTED IMMEDIATELY:  *FEVER GREATER THAN 100.5 F  *CHILLS WITH OR WITHOUT FEVER  NAUSEA AND VOMITING THAT IS NOT CONTROLLED WITH YOUR NAUSEA MEDICATION  *UNUSUAL SHORTNESS OF BREATH  *UNUSUAL BRUISING OR BLEEDING  TENDERNESS IN MOUTH AND THROAT WITH OR WITHOUT PRESENCE OF ULCERS  *URINARY PROBLEMS  *BOWEL PROBLEMS  UNUSUAL RASH Items with * indicate a potential emergency and should be followed up as soon as possible.  Feel free to call the clinic should you have any questions or concerns. The clinic phone number is [phone].  Please show the CHEMO ALERT CARD at check-in to the Emergency Department and triage nurse.   

## 2019-02-10 NOTE — Progress Notes (Signed)
At 13:42 during pt's 219mL bolus of NS, pt began c/o feeling warm/flushed and her face appeared bright red. Denied any CP or SOB and vitals were stable. Dr. Irene Limbo notified and received orders for: 80 mg Solumedrol IV, 25 mg Benadryl IV, and 20 mg Pepcid IVPB. Orders placed and medications administered. Will continue to monitor.   Vitals:   02/10/19 1247 02/10/19 1345  BP: 125/76 134/79  Pulse: 97 75  Resp: 18 18  Temp: 98.5 F (36.9 C) 97.9 F (36.6 C)  TempSrc: Oral Oral  SpO2: 98% 98%   About 20 min after additional medications administered, pt stated she was feeling better, but her face/neck still appeared flushed. Dr. Irene Limbo updated and came to assess the pt in the treatment area. After his assessment received the OK to administer Kyprolis infusion. Will continue to monitor.  Pt tolerated Kyprolis infusion without any issues. Per Dr. Irene Limbo: reiterated that Decadron will be added to her premedications before future infusions, so the only time she needs to take Decadron at home is the week she isn't receiving treatment. Also, she can take Tylenol and Benadryl every 6 hours prn if flushing returns. Pt verbalized understanding and agreement. Ambulated out of clinic without incident.

## 2019-02-13 NOTE — Telephone Encounter (Signed)
Oral Oncology Patient Advocate Encounter  Confirmed with [facility] that the Revlimid will be delivered to the patient on 02/15/19 with a $0 copay using the [employer].  [facility] Phone [phone] Fax [phone] 02/13/2019   11:47 AM

## 2019-02-15 ENCOUNTER — Ambulatory Visit: Payer: Medicare Other

## 2019-02-16 ENCOUNTER — Inpatient Hospital Stay: Payer: Medicare Other | Admitting: Hematology

## 2019-02-16 ENCOUNTER — Other Ambulatory Visit: Payer: Self-pay | Admitting: *Deleted

## 2019-02-16 ENCOUNTER — Inpatient Hospital Stay: Payer: Medicare Other

## 2019-02-16 ENCOUNTER — Other Ambulatory Visit: Payer: Medicare Other

## 2019-02-16 ENCOUNTER — Inpatient Hospital Stay: Payer: Medicare Other | Attending: Hematology

## 2019-02-16 VITALS — BP 136/70 | HR 84 | Temp 98.3°F | Resp 18 | Ht 59.0 in | Wt 176.1 lb

## 2019-02-16 DIAGNOSIS — E119 Type 2 diabetes mellitus without complications: Secondary | ICD-10-CM

## 2019-02-16 DIAGNOSIS — C9000 Multiple myeloma not having achieved remission: Secondary | ICD-10-CM

## 2019-02-16 DIAGNOSIS — E1165 Type 2 diabetes mellitus with hyperglycemia: Secondary | ICD-10-CM | POA: Insufficient documentation

## 2019-02-16 DIAGNOSIS — K5900 Constipation, unspecified: Secondary | ICD-10-CM | POA: Diagnosis not present

## 2019-02-16 DIAGNOSIS — Z5112 Encounter for antineoplastic immunotherapy: Secondary | ICD-10-CM | POA: Diagnosis not present

## 2019-02-16 DIAGNOSIS — R9389 Abnormal findings on diagnostic imaging of other specified body structures: Secondary | ICD-10-CM | POA: Diagnosis not present

## 2019-02-16 DIAGNOSIS — Z7189 Other specified counseling: Secondary | ICD-10-CM

## 2019-02-16 LAB — COMPREHENSIVE METABOLIC PANEL
ALT: 47 U/L — AB (ref 0–44)
AST: 41 U/L (ref 15–41)
Albumin: 3.4 g/dL — ABNORMAL LOW (ref 3.5–5.0)
Alkaline Phosphatase: 99 U/L (ref 38–126)
Anion gap: 9 (ref 5–15)
BUN: 17 mg/dL (ref 8–23)
CO2: 23 mmol/L (ref 22–32)
Calcium: 10.2 mg/dL (ref 8.9–10.3)
Chloride: 103 mmol/L (ref 98–111)
Creatinine, Ser: 0.63 mg/dL (ref 0.44–1.00)
GFR calc non Af Amer: >60 mL/min (ref >60)
Glucose, Bld: 260 mg/dL — ABNORMAL HIGH (ref 70–99)
Potassium: 4.2 mmol/L (ref 3.5–5.1)
Sodium: 135 mmol/L (ref 135–145)
Total Bilirubin: 0.6 mg/dL (ref 0.3–1.2)
Total Protein: 6.3 g/dL — ABNORMAL LOW (ref 6.5–8.1)

## 2019-02-16 LAB — CBC WITH DIFFERENTIAL (CANCER CENTER ONLY)
Abs Immature Granulocytes: 0.03 10*3/uL (ref 0.00–0.07)
Basophils Absolute: 0 10*3/uL (ref 0.0–0.1)
Basophils Relative: 0 %
Eosinophils Absolute: 0.1 10*3/uL (ref 0.0–0.5)
Eosinophils Relative: 2 %
HCT: 37.7 % (ref 36.0–46.0)
Hemoglobin: 12.8 g/dL (ref 12.0–15.0)
Immature Granulocytes: 1 %
Lymphocytes Relative: 23 %
Lymphs Abs: 1.4 10*3/uL (ref 0.7–4.0)
MCH: 31.1 pg (ref 26.0–34.0)
MCHC: 34 g/dL (ref 30.0–36.0)
MCV: 91.7 fL (ref 80.0–100.0)
Monocytes Absolute: 1.1 10*3/uL — ABNORMAL HIGH (ref 0.1–1.0)
Monocytes Relative: 17 %
NEUTROS ABS: 3.6 10*3/uL (ref 1.7–7.7)
Neutrophils Relative %: 57 %
Platelet Count: 170 10*3/uL (ref 150–400)
RBC: 4.11 MIL/uL (ref 3.87–5.11)
RDW: 12.4 % (ref 11.5–15.5)
WBC Count: 6.2 10*3/uL (ref 4.0–10.5)
nRBC: 0 % (ref 0.0–0.2)

## 2019-02-16 MED ORDER — SODIUM CHLORIDE 0.9 % IV SOLN
Freq: Once | INTRAVENOUS | Status: DC
  Filled 2019-02-16: qty 250

## 2019-02-16 MED ORDER — ACETAMINOPHEN 325 MG PO TABS
ORAL_TABLET | ORAL | Status: AC
  Filled 2019-02-16: qty 2

## 2019-02-16 MED ORDER — METHYLPREDNISOLONE SODIUM SUCC 40 MG IJ SOLR
40.0000 mg | Freq: Once | INTRAMUSCULAR | Status: AC
  Administered 2019-02-16: 40 mg via INTRAVENOUS

## 2019-02-16 MED ORDER — METHYLPREDNISOLONE SODIUM SUCC 40 MG IJ SOLR
INTRAMUSCULAR | Status: AC
  Filled 2019-02-16: qty 1

## 2019-02-16 MED ORDER — DEXTROSE 5 % IV SOLN
27.0000 mg/m2 | Freq: Once | INTRAVENOUS | Status: AC
  Administered 2019-02-16: 50 mg via INTRAVENOUS
  Filled 2019-02-16: qty 10

## 2019-02-16 MED ORDER — PROCHLORPERAZINE MALEATE 10 MG PO TABS
10.0000 mg | ORAL_TABLET | Freq: Once | ORAL | Status: AC
  Administered 2019-02-16: 10 mg via ORAL

## 2019-02-16 MED ORDER — ZOLEDRONIC ACID 4 MG/100ML IV SOLN
4.0000 mg | Freq: Once | INTRAVENOUS | Status: AC
  Administered 2019-02-16: 4 mg via INTRAVENOUS
  Filled 2019-02-16: qty 100

## 2019-02-16 MED ORDER — SODIUM CHLORIDE 0.9 % IV SOLN
Freq: Once | INTRAVENOUS | Status: AC
  Administered 2019-02-16: 14:00:00 via INTRAVENOUS
  Filled 2019-02-16: qty 250

## 2019-02-16 MED ORDER — PROCHLORPERAZINE MALEATE 10 MG PO TABS
ORAL_TABLET | ORAL | Status: AC
  Filled 2019-02-16: qty 1

## 2019-02-16 MED ORDER — ACETAMINOPHEN 325 MG PO TABS
650.0000 mg | ORAL_TABLET | Freq: Once | ORAL | Status: AC
  Administered 2019-02-16: 650 mg via ORAL

## 2019-02-16 NOTE — Progress Notes (Signed)
HEMATOLOGY/ONCOLOGY CLINIC NOTE  Date of Service: 02/16/2019  Patient Care Team: Hoyt Koch, MD as PCP - General (Internal Medicine) Lafayette Dragon, MD (Inactive) as Consulting Physician (Gastroenterology) Megan Salon, MD as Consulting Physician (Gynecology) Melrose Nakayama, MD as Consulting Physician (Orthopedic Surgery) Deneise Lever, MD as Consulting Physician (Pulmonary Disease) [name], MD (Ophthalmology)  CHIEF COMPLAINTS/PURPOSE OF CONSULTATION:  myeloma  HISTORY OF PRESENTING ILLNESS:   Faith Orr is a wonderful 78 y.o. female who has been referred to [name] by Dr. Pricilla Holm for evaluation and management of Lytic bone lesions. She is accompanied today by her son in law, and her partner is present via phone. The pt reports that she is doing well overall.   The pt notes that 2-3 months ago while standing at the stove, and otherwise feeling normally, she felt "something snap that took her breath away" in her mid back as she stretched to get something. The pt notes that she saw a chiropractor twice due to her back stiffness, which did not help. The pt then described her new bone pains to her PCP on 12/05/18, and subsequent imaging, as noted below, revealed concerns for numerous bone lesions. She has begun 26m Fosamax. The pt notes that she had hip pain in 2018, and that an XR at that time did not reveal any lesions.  The pt notes that most of her pain is concentrated to her left shoulder presently, and with minimal movement of the arm. She endorses pain radiating into her left arm and notes that her hand is swollen in the mornings when she wakes up. She also endorses present back pain and right hip pain, worse when she walks. She has not yet seen orthopedics. The pt reports that she is needing to take 6019mAdvil every 4-6 hours as Tramadol alone has not been able to alleviate her pain. The pt denies any unexpected weight loss, fevers, chills, or night  sweats. The pt notes that her urine is a very dark color presently, but denies overt blood in the urine, underpants, nor tissue paper. The pt notes that in the last two weeks she has had some soreness in her head, but denies new headaches or changes in vision. The pt notes that she has been urinating more frequently overall, but has been trying to stay better hydrated as well.  The pt notes that she has been compliant with annual mammograms.   The pt notes that she had a cyst in her right breast which was removed in the past. She fractured her left wrist in 2008 after falling down stairs. The pt endorses history of fatty liver.   The pt denies ever smoking cigarettes and endorses significant second hand smoke exposure with a previous marriage.   Of note prior to the patient's visit today, pt has had a Bone Scan completed on 12/13/18 with results revealing Multifocal uptake throughout the skeleton, consistent with diffuse metastatic disease. Primary tumor is not specified. 2. Uptake in the proximal right femur, consistent with lytic lesions. 3. Uptake in the ribs bilaterally as described. 4. Lesions in the proximal left humerus. 5. Diffuse uptake throughout the skull consistent with metastatic disease. 6. Right paramedian uptake at the manubrium.  Most recent lab results (12/08/18) of CBC w/diff and CMP is as follows: all values are WNL except for Glucose at 279, BUN at 24, AST at 41, ALT at 46. 12/08/18 SPEP revealed all values WNL except for Total Protein at 6.0,  Albumin at 3.6, Gamma globulin at 0.7, and M spike at 0.5g  On review of systems, pt reports significant left shoulder pain, back pain, right hip pain, dark urine, and denies fevers, chills, night sweats, unexpected weight loss, changes in bowel habits, changes in breathing, cough, new respiratory symptoms, changes in vision, abdominal pains, leg swelling, and any other symptoms.   On PMHx the pt reports fatty liver, and denies blood clots.    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 Denies ever smoking.  On Family Hx the pt reports maternal grandmother with colon cancer. Father with bladder cancer and amyloidosis (pt notes that this could have been misdiagnosis). Mother with Protein S deficiency and polymyalgia rheumatica.  Interval History:   [name] returns today for management and evaluation of her newly diagnosed Multiple myeloma and C1D15 treatment. The patient's last visit with [name] was on 02/09/19. The pt reports that she is doing well overall.   The pt reports that she just received Revlimid yesterday, 02/15/19. She notes that she has tolerated C1 of Carfilzomib and Dexamethasone very well so far. She is eating well, and notes that her pain is controlled. She is taking 2 pain medications each day, one in the morning and one at night.   The pt notes that she is ambulating in her house better and also endorses increased range of motion in her left shoulder.   The pt notes that her blood sugars have not exceeded 250 at home, and last two mornings have been at 173.  The pt will be seeing the [facility] orthopedic team next week. The pt has avoided lifting, pulling, or pushing and denies any concerns of new bone pains or fractures.  Lab results today (02/16/19) of CBC w/diff and CMP is as follows: all values are WNL except for Monocytes abs at 1.1k, Glucose at 260, Total Protein at 6.3, Albumin at 3.4, ALT at 47.  On review of systems, pt reports eating well, controlled pain, and denies new bone pains, leg swelling, and any other symptoms.   MEDICAL HISTORY:  Past Medical History:  Diagnosis Date  . Allergy    seasonal  . Asthma   . DEPRESSION   . DIABETES MELLITUS, TYPE II   . Diverticulosis   . HYPERLIPIDEMIA   . Macular degeneration of left eye    mild, Dr.Hecker  . Obesity, unspecified   . Osteoarthritis of both knees   . OSTEOPENIA   . Osteopenia   . URINARY  INCONTINENCE     SURGICAL HISTORY: Past Surgical History:  Procedure Laterality Date  . BREAST SURGERY    . CATARACT EXTRACTION Left 05/24/2018  . CESAREAN SECTION  01/1973  . FRACTURE SURGERY    . left wrist surgery  2008   By Dr. Latanya Maudlin  . right ankle  1994    SOCIAL HISTORY: Social History   Socioeconomic History  . Marital status: Married    Spouse name: Not on file  . Number of children: 1  . Years of education: Not on file  . Highest education level: Not on file  Occupational History    Employer: [employer]  Social Needs  . Financial resource strain: Patient refused  . Food insecurity:    Worry: Patient refused    Inability: Patient refused  . Transportation needs:    Medical: Patient refused    Non-medical: Patient refused  Tobacco Use  . Smoking status: Never Smoker  .  Smokeless tobacco: Never Used  . Tobacco comment: Lives with partner [name]) and son  Substance and Sexual Activity  . Alcohol use: No    Alcohol/week: 0.0 standard drinks  . Drug use: No  . Sexual activity: Never    Partners: Female    Birth control/protection: Post-menopausal    Comment: Lives with female partner (annette hicks) and 56 yo son  Lifestyle  . Physical activity:    Days per week: Patient refused    Minutes per session: Patient refused  . Stress: Patient refused  Relationships  . Social connections:    Talks on phone: Patient refused    Gets together: Patient refused    Attends religious service: Patient refused    Active member of club or organization: Patient refused    Attends meetings of clubs or organizations: Patient refused    Relationship status: Patient refused  . Intimate partner violence:    Fear of current or ex partner: Patient refused    Emotionally abused: Patient refused    Physically abused: Patient refused    Forced sexual activity: Patient refused  Other Topics Concern  . Not on file  Social History Narrative  . Not on file      FAMILY HISTORY: Family History  Problem Relation Age of Onset  . Diabetes Father   . Hyperlipidemia Father   . Heart disease Father   . Cancer Father   . Hypertension Father   . Colon cancer Paternal Grandmother 35  . Osteoporosis Mother   . Protein S deficiency Mother   . Hyperlipidemia Mother   . Multiple sclerosis Daughter   . Cancer Other        bladder  . Breast cancer Neg Hx     ALLERGIES:  is allergic to penicillins; aleve [naproxen sodium]; and sulfonamide derivatives.  MEDICATIONS:  Current Outpatient Medications  Medication Sig Dispense Refill  . acyclovir (ZOVIRAX) 400 MG tablet Take 1 tablet (400 mg total) by mouth 2 (two) times daily. 60 tablet 3  . albuterol (PROVENTIL HFA;VENTOLIN HFA) 108 (90 Base) MCG/ACT inhaler INHALE 1 TO 2 PUFFS INTO THE LUNGS EVERY 6 HOURS AS NEEDED FOR WHEEZING OR SHORTNESS OF BREATH (Patient not taking: No sig reported) 54 g 1  . aspirin EC 81 MG tablet Take 81 mg by mouth daily after breakfast.     . Blood Glucose Monitoring Suppl (FREESTYLE FREEDOM LITE) W/DEVICE KIT Use to check blood sugars twice a day Dx 250.00 1 each 0  . Calcium Carbonate-Vitamin D (CALCIUM 600+D HIGH POTENCY) 600-400 MG-UNIT per tablet Take 1 tablet by mouth 2 (two) times daily.     . Continuous Blood Gluc Sensor (FREESTYLE LIBRE 14 DAY SENSOR) MISC 1 each by Does not apply route every 14 (fourteen) days. 2 each 11  . dexamethasone (DECADRON) 4 MG tablet Take 5 tablets (20 mg total) by mouth once a week. On D1,8 and 15 of each cycle of treatment (Patient taking differently: Take 20 mg by mouth See admin instructions. On Day 1,8 and 15 of each cycle of treatment) 20 tablet 5  . fentaNYL (DURAGESIC) 12 MCG/HR Place 1 patch onto the skin every 3 (three) days. 10 patch 0  . fluticasone (FLONASE) 50 MCG/ACT nasal spray Place 1 spray into both nostrils daily. (Patient taking differently: Place 1 spray into both nostrils daily as needed for allergies or rhinitis. ) 16 g  2  . glucose blood (FREESTYLE LITE) test strip CHECK BLOOD SUGAR TWICE DAILY AS DIRECTED Dx  250.00 180 each 3  . Lancets (FREESTYLE) lancets Use twice daily to check sugars. 100 each 11  . lenalidomide (REVLIMID) 15 MG capsule Take 1 capsule (15 mg total) by mouth daily. Take for 21 days on, 7 days off, repeat every 28 days. [employer] [phone] 02/03/19 (Patient not taking: Reported on 02/07/2019) 21 capsule 0  . metFORMIN (GLUCOPHAGE-XR) 500 MG 24 hr tablet Take 3 tablets (1,500 mg total) by mouth daily with breakfast. (Patient taking differently: Take 500-1,000 mg by mouth See admin instructions. 500 mg every morning, 1,000 mg every night) 270 tablet 3  . montelukast (SINGULAIR) 10 MG tablet TAKE 1 TABLET BY MOUTH DAILY AS NEEDED (Patient taking differently: Take 10 mg by mouth at bedtime as needed (allergies). ) 30 tablet 3  . Multiple Vitamins-Minerals (ICAPS) CAPS Take 1 capsule by mouth daily after breakfast.     . ondansetron (ZOFRAN) 8 MG tablet Take 1 tablet (8 mg total) by mouth 2 (two) times daily as needed (Nausea or vomiting). 30 tablet 1  . oxyCODONE 10 MG TABS Take 1 tablet (10 mg total) by mouth every 6 (six) hours as needed for moderate pain or severe pain. 90 tablet 0  . pantoprazole (PROTONIX) 20 MG tablet TAKE 1 TABLET(20 MG) BY MOUTH DAILY (Patient not taking: No sig reported) 30 tablet 5  . polyethylene glycol (MIRALAX / GLYCOLAX) packet Take 17 g by mouth daily after breakfast.     . prochlorperazine (COMPAZINE) 10 MG tablet Take 1 tablet (10 mg total) by mouth every 6 (six) hours as needed (Nausea or vomiting). 30 tablet 1  . senna-docusate (SENNA S) 8.6-50 MG tablet Take 2 tablets by mouth at bedtime. 60 tablet 1  . sertraline (ZOLOFT) 50 MG tablet TAKE 1 TABLET BY MOUTH DAILY (Patient taking differently: Take 50 mg by mouth at bedtime. ) 90 tablet 1  . simvastatin (ZOCOR) 20 MG tablet TAKE 1 TABLET(20 MG) BY MOUTH DAILY (Patient taking differently: Take 20 mg by mouth every  evening. ) 90 tablet 1  . Vitamin D, Ergocalciferol, (DRISDOL) 1.25 MG (50000 UT) CAPS capsule Take 1 capsule (50,000 Units total) by mouth every 7 (seven) days. 12 capsule 0   No current facility-administered medications for this visit.     REVIEW OF SYSTEMS:    A 10+ POINT REVIEW OF SYSTEMS WAS OBTAINED including neurology, dermatology, psychiatry, cardiac, respiratory, lymph, extremities, GI, GU, Musculoskeletal, constitutional, breasts, reproductive, HEENT.  All pertinent positives are noted in the HPI.  All others are negative.   PHYSICAL EXAMINATION: ECOG PERFORMANCE STATUS: 2 - Symptomatic, <50% confined to bed  . Vitals:   02/16/19 1214  BP: 136/70  Pulse: 84  Resp: 18  Temp: 98.3 F (36.8 C)  SpO2: 98%   Filed Weights   02/16/19 1214  Weight: 176 lb 1.6 oz (79.9 kg)   .Body mass index is 35.57 kg/m.  GENERAL:alert, in no acute distress and comfortable SKIN: no acute rashes, no significant lesions EYES: conjunctiva are pink and non-injected, sclera anicteric OROPHARYNX: MMM, no exudates, no oropharyngeal erythema or ulceration NECK: supple, no JVD LYMPH:  no palpable lymphadenopathy in the cervical, axillary or inguinal regions LUNGS: clear to auscultation b/l with normal respiratory effort HEART: regular rate & rhythm ABDOMEN:  normoactive bowel sounds , non tender, not distended. No palpable hepatosplenomegaly.  Extremity: no pedal edema PSYCH: alert & oriented x 3 with fluent speech NEURO: no focal motor/sensory deficits   LABORATORY DATA:  I have reviewed the data as listed  .  CBC Latest Ref Rng & Units 02/16/2019 02/09/2019 02/05/2019  WBC 4.0 - 10.5 K/uL 6.2 6.0 4.1  Hemoglobin 12.0 - 15.0 g/dL 12.8 12.6 10.7(L)  Hematocrit 36.0 - 46.0 % 37.7 37.7 33.1(L)  Platelets 150 - 400 K/uL 170 192 136(L)   . CBC    Component Value Date/Time   WBC 6.2 02/16/2019 1138   WBC 4.1 02/05/2019 0435   RBC 4.11 02/16/2019 1138   HGB 12.8 02/16/2019 1138   HCT 37.7  02/16/2019 1138   PLT 170 02/16/2019 1138   MCV 91.7 02/16/2019 1138   MCH 31.1 02/16/2019 1138   MCHC 34.0 02/16/2019 1138   RDW 12.4 02/16/2019 1138   LYMPHSABS 1.4 02/16/2019 1138   MONOABS 1.1 (H) 02/16/2019 1138   EOSABS 0.1 02/16/2019 1138   BASOSABS 0.0 02/16/2019 1138    . CMP Latest Ref Rng & Units 02/16/2019 02/09/2019 02/05/2019  Glucose 70 - 99 mg/dL 260(H) 177(H) 242(H)  BUN 8 - 23 mg/dL '17 15 14  ' Creatinine 0.44 - 1.00 mg/dL 0.63 0.69 0.67  Sodium 135 - 145 mmol/L 135 138 138  Potassium 3.5 - 5.1 mmol/L 4.2 4.1 4.1  Chloride 98 - 111 mmol/L 103 103 110  CO2 22 - 32 mmol/L 23 22 21(L)  Calcium 8.9 - 10.3 mg/dL 10.2 10.2 8.8(L)  Total Protein 6.5 - 8.1 g/dL 6.3(L) 6.5 -  Total Bilirubin 0.3 - 1.2 mg/dL 0.6 0.7 -  Alkaline Phos 38 - 126 U/L 99 106 -  AST 15 - 41 U/L 41 50(H) -  ALT 0 - 44 U/L 47(H) 62(H) -   Component     Latest Ref Rng & Units 12/27/2018  IgG (Immunoglobin G), Serum     700 - 1,600 mg/dL 801  IgA     64 - 422 mg/dL 91  IgM (Immunoglobulin M), Srm     26 - 217 mg/dL 15 (L)  Total Protein ELP     6.0 - 8.5 g/dL 6.6  Albumin SerPl Elph-Mcnc     2.9 - 4.4 g/dL 3.7  Alpha 1     0.0 - 0.4 g/dL 0.2  Alpha2 Glob SerPl Elph-Mcnc     0.4 - 1.0 g/dL 0.8  B-Globulin SerPl Elph-Mcnc     0.7 - 1.3 g/dL 1.2  Gamma Glob SerPl Elph-Mcnc     0.4 - 1.8 g/dL 0.7  M Protein SerPl Elph-Mcnc     Not Observed g/dL 0.5 (H)  Globulin, Total     2.2 - 3.9 g/dL 2.9  Albumin/Glob SerPl     0.7 - 1.7 1.3  IFE 1      Comment  Please Note (HCV):      Comment  Kappa free light chain     3.3 - 19.4 mg/L 5.1  Lamda free light chains     5.7 - 26.3 mg/L 40.3 (H)  Kappa, lamda light chain ratio     0.26 - 1.65 0.13 (L)  Beta-2 Microglobulin     0.6 - 2.4 mg/L 1.7  LDH     98 - 192 U/L 162  Sed Rate     0 - 22 mm/hr 8       01/06/19 Cytogenetics:       RADIOGRAPHIC STUDIES: I have personally reviewed the radiological images as listed and agreed with  the findings in the report. Dg Chest 2 View  Result Date: 02/04/2019 CLINICAL DATA:  Fever, chills EXAM: CHEST - 2 VIEW COMPARISON:  10/14/2015 FINDINGS: Heart is borderline  in size. Linear atelectasis or scarring in the lingula. No confluent airspace opacities or effusions. No acute bony abnormality. IMPRESSION: Lingular atelectasis or scarring. Borderline heart size. No active disease. Electronically Signed   By: Rolm Baptise M.D.   On: 02/04/2019 23:25   Mr Liver W Wo Contrast  Result Date: 01/18/2019 CLINICAL DATA:  Multiple myeloma, hepatic activity heterogeneity on PET-CT for further investigation. EXAM: MRI ABDOMEN WITHOUT AND WITH CONTRAST TECHNIQUE: Multiplanar multisequence MR imaging of the abdomen was performed both before and after the administration of intravenous contrast. CONTRAST:  8 cc Gadavist COMPARISON:  01/05/2019 FINDINGS: Lower chest: Mild cardiomegaly. Hepatobiliary: Diffuse struck bout of signal in the liver compatible with hepatic steatosis. In segment 7 of the liver, a 1.2 by 1.1 cm T2 hyperintense lesion is observed without associated enhancement, compatible with a hepatic cyst. In the lateral segment left hepatic lobe, a 0.6 cm and separate 0.3 cm T2 hyperintense lesions are observed, both shown on image 37/11, neither enhancing and compatible with cysts. Peripheral wedge-shaped 2.3 by 2.1 cm focus of initial hypoenhancement but subsequent delayed accentuated enhancement on late phase images was not previously hypermetabolic. This has mildly accentuated T2 signal characteristics and overall is thought to likely represent focal scarring., less likely atypical hemangioma. In the right hepatic lobe on image 24/1501 there is a small focus of arterial phase enhancement which appears very similar to the blush of enhancement shown on CT abdomen of 05/22/2011. This is fairly poorly seen on later phase images, with only some equivocal linear enhancement in the area, and could be from an  unusual hemangioma/vascular malformation or more likely, focal nodular hyperplasia. No worrisome lesions to correlate with the heterogeneity seen on MRI. No biliary dilatation. Pancreas:  Unremarkable Spleen:  Unremarkable Adrenals/Urinary Tract: There are 2 tiny left renal cysts. Adrenal glands normal. Stomach/Bowel: Unremarkable Vascular/Lymphatic:  Aortoiliac atherosclerotic vascular disease. Other:  No supplemental non-categorized findings. Musculoskeletal: Scattered bony lesions are observed including a 2.8 by 1.8 cm lesion in the left side of the T9 vertebral body, corresponding to the patient's known myeloma/lytic lesions. IMPRESSION: 1. Several appreciable liver lesions all have benign imaging characteristics. No MRI findings of metastatic involvement of the liver. 2. Scattered bony lesions corresponding to the lytic lesions seen at PET-CT, compatible with active myeloma. 3.  Aortic Atherosclerosis (ICD10-I70.0).  Mild cardiomegaly. 4. Diffuse hepatic steatosis. Electronically Signed   By: Van Clines M.D.   On: 01/18/2019 09:16    ASSESSMENT & PLAN:   78 y.o. female with  1. Newly diagnosed Multiple Myeloma, RISS Stage III  Labs upon initial presentation from 12/08/18, blood counts are normal including WBC at 7.1k, HGB at 13.1, and PLT at 245k. Calcium normal at 10.3. Creatinine normal at 0.63. M spike at 0.5g. 12/13/18 Bone Scan revealed Multifocal uptake throughout the skeleton, consistent with diffuse metastatic disease. Primary tumor is not specified. 2. Uptake in the proximal right femur, consistent with lytic lesions. 3. Uptake in the ribs bilaterally as described. 4. Lesions in the proximal left humerus. 5. Diffuse uptake throughout the skull consistent with metastatic disease. 6. Right paramedian uptake at the manubrium.  12/13/18 CT Right Femur revealed Numerous lytic lesions involving the right femur and a lytic lesion in the left inferior pubic ramus. Overall appearance is most  concerning for multiple myeloma  12/27/18 Pretreatment 24hour UPEP observed an M spike at 77m, and showed 1934mtotal protein/day.  12/27/18 Pretreatment MMP revealed M Protein at 0.5g with IgG Lambda specificity. Kappa:Lambda light chain  ratio at 0.13, with Lambda at 40.3. There is less abnormal protein and light chains than I would expect from 30% plasma cells, which suggests hypo-secretory or non-secretory neoplastic plasma cells. Will have an impact in assessing response. 01/05/19 PET/CT revealed Innumerable lytic lesions in the skeleton compatible with myeloma. Most of the larger lesions are hypermetabolic, for example including a left proximal humeral shaft lesion with maximum SUV of 8.1 and a 2.8 cm lesion in the left T9 vertebral body with maximum SUV 5.1. Most of the smaller lytic lesions, and some of the larger lesions, do not demonstrate accentuated metabolic activity. 2. 1.2 cm in short axis lymph node in the left parapharyngeal space is hypermetabolic with maximum SUV 11.8. I do not see a separate mass in the head and neck to give rise to this hypermetabolic lymph node. 3. Mosaic attenuation in the lower lobes, nonspecific possibly from air trapping. 4.  Aortic Atherosclerosis 5. Heterogeneous activity in the liver, making it hard to exclude small liver lesions. Consider hepatic protocol MRI with and without contrast for definitive assessment. Nonobstructive right nephrolithiasis. Old granulomatous disease  01/06/19 Bone Marrow biopsy revealed interstitial increase in plasma cells (28% aspirate, 40% CD138 immunohistochemistry). Plasma cells negative for light chains consistent with a non or weakly secretory myeloma   01/06/19 Cytogenetics revealed 37% of cells with trisomy 11 or 11q deletion, and 40.5% of cells with 17p mutation  2. 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  01/17/19 MRI Liver revealed  Several appreciable liver lesions all have benign imaging characteristics. No MRI findings of metastatic involvement of the liver. 2. Scattered bony lesions corresponding to the lytic lesions seen at PET-CT, compatible with active myeloma. 3. Aortic Atherosclerosis.  Mild cardiomegaly. 4. Diffuse hepatic steatosis.   PLAN -Discussed pt labwork today, 02/16/19; blood counts normal, Glucose high at 260, other chemistries stable, calcium and kidney functions normal -The pt has no prohibitive toxicities from continuing C1D15 Carfilzomib and Dexamethasone at this time. -The pt will begin Revlimid with C2D1 of Carfilzomib and Dexamethasone -Will treat until maximum response, or plateau response -Adjusted premedications to add Tylenol and will split dose of Dexamethasone with pre-meds and pt will not take Decadron at home except on D22 -Okay to take Tylenol at home if feeling flushed, however pt will seek medical attention if fevers rise above 100.4 or repeat in frequency -Will watch lymph node in left parapharyngeal space seen on PET/CT -Follow up with orthopedic referral to Dr. Janice Coffin at [facility] on 02/21/19 for possible prophylactic interventions to mitigate risk of pathologic fractures -Avoid lifting, pulling, pushing with left upper extremity  -Avoid high impact activities and stairs to preserve right hip as well -May be a role for local radiation therapy for pain control at T9 and left shoulder -Continue Fentanyl patch and 36m Oxycodone for break through pain -Continue Zofran for opiate-related nausea -Continue Miralax once a day, and Senna S two tablets BID, back off as constipation resolves -Continue Zometa -Continue Acyclovir and 853mAspirin to mitigate risk of Shingles and blood clots respectively -Continue follow up with PCP Dr. ElPricilla Holmor management and evaluation of her DM, especially while on treatment with steroids as blood sugars will be higher -Will see the pt back  on C2D8   Please schedule C2 as per orders Labs on D1,D8 and D15 of each cycle with port flush RTC with Dr KaIrene [id number]   All of the patients  questions were answered with apparent satisfaction. The patient knows to call the clinic with any problems, questions or concerns.  The total time spent in the appt was 25 minutes and more than 50% was on counseling and direct patient cares.    Sullivan Lone MD MS AAHIVMS [facility] Hematology/Oncology Physician [facility]  (Office):       [phone] (Work cell):  [phone] (Fax):           [phone]  02/16/2019 1:19 PM  I, Baldwin Jamaica, am acting as a scribe for Dr. Sullivan Lone.   .I have reviewed the above documentation for accuracy and completeness, and I agree with the above. Brunetta Genera MD

## 2019-02-16 NOTE — Patient Instructions (Signed)
[facility] Discharge Instructions for Patients Receiving Chemotherapy  Today you received the following chemotherapy agents: Carfilzomib (Kyprolis)  To help prevent nausea and vomiting after your treatment, we encourage you to take your nausea medication as directed.    If you develop nausea and vomiting that is not controlled by your nausea medication, call the clinic.   BELOW ARE SYMPTOMS THAT SHOULD BE REPORTED IMMEDIATELY:  *FEVER GREATER THAN 100.5 F  *CHILLS WITH OR WITHOUT FEVER  NAUSEA AND VOMITING THAT IS NOT CONTROLLED WITH YOUR NAUSEA MEDICATION  *UNUSUAL SHORTNESS OF BREATH  *UNUSUAL BRUISING OR BLEEDING  TENDERNESS IN MOUTH AND THROAT WITH OR WITHOUT PRESENCE OF ULCERS  *URINARY PROBLEMS  *BOWEL PROBLEMS  UNUSUAL RASH Items with * indicate a potential emergency and should be followed up as soon as possible.  Feel free to call the clinic should you have any questions or concerns. The clinic phone number is [phone].  Please show the [facility] at check-in to the Emergency Department and triage nurse.  Zoledronic Acid injection (Hypercalcemia, Oncology) What is this medicine? ZOLEDRONIC ACID (ZOE le dron ik AS id) lowers the amount of calcium loss from bone. It is used to treat too much calcium in your blood from cancer. It is also used to prevent complications of cancer that has spread to the bone. This medicine may be used for other purposes; ask your health care provider or pharmacist if you have questions. COMMON BRAND NAME(S): Zometa What should I tell my health care provider before I take this medicine? They need to know if you have any of these conditions: -aspirin-sensitive asthma -cancer, especially if you are receiving medicines used to treat cancer -dental disease or wear dentures -infection -kidney disease -receiving corticosteroids like dexamethasone or prednisone -an unusual or allergic reaction to zoledronic acid,  other medicines, foods, dyes, or preservatives -pregnant or trying to get pregnant -breast-feeding How should I use this medicine? This medicine is for infusion into a vein. It is given by a health care professional in a hospital or clinic setting. Talk to your pediatrician regarding the use of this medicine in children. Special care may be needed. Overdosage: If you think you have taken too much of this medicine contact a poison control center or emergency room at once. NOTE: This medicine is only for you. Do not share this medicine with others. What if I miss a dose? It is important not to miss your dose. Call your doctor or health care professional if you are unable to keep an appointment. What may interact with this medicine? -certain antibiotics given by injection -NSAIDs, medicines for pain and inflammation, like ibuprofen or naproxen -some diuretics like bumetanide, furosemide -teriparatide -thalidomide This list may not describe all possible interactions. Give your health care provider a list of all the medicines, herbs, non-prescription drugs, or dietary supplements you use. Also tell them if you smoke, drink alcohol, or use illegal drugs. Some items may interact with your medicine. What should I watch for while using this medicine? Visit your doctor or health care professional for regular checkups. It may be some time before you see the benefit from this medicine. Do not stop taking your medicine unless your doctor tells you to. Your doctor may order blood tests or other tests to see how you are doing. Women should inform their doctor if they wish to become pregnant or think they might be pregnant. There is a potential for serious side effects to an unborn child.  Talk to your health care professional or pharmacist for more information. You should make sure that you get enough calcium and vitamin D while you are taking this medicine. Discuss the foods you eat and the vitamins you take  with your health care professional. Some people who take this medicine have severe bone, joint, and/or muscle pain. This medicine may also increase your risk for jaw problems or a broken thigh bone. Tell your doctor right away if you have severe pain in your jaw, bones, joints, or muscles. Tell your doctor if you have any pain that does not go away or that gets worse. Tell your dentist and dental surgeon that you are taking this medicine. You should not have major dental surgery while on this medicine. See your dentist to have a dental exam and fix any dental problems before starting this medicine. Take good care of your teeth while on this medicine. Make sure you see your dentist for regular follow-up appointments. What side effects may I notice from receiving this medicine? Side effects that you should report to your doctor or health care professional as soon as possible: -allergic reactions like skin rash, itching or hives, swelling of the face, lips, or tongue -anxiety, confusion, or depression -breathing problems -changes in vision -eye pain -feeling faint or lightheaded, falls -jaw pain, especially after dental work -mouth sores -muscle cramps, stiffness, or weakness -redness, blistering, peeling or loosening of the skin, including inside the mouth -trouble passing urine or change in the amount of urine Side effects that usually do not require medical attention (report to your doctor or health care professional if they continue or are bothersome): -bone, joint, or muscle pain -constipation -diarrhea -fever -hair loss -irritation at site where injected -loss of appetite -nausea, vomiting -stomach upset -trouble sleeping -trouble swallowing -weak or tired This list may not describe all possible side effects. Call your doctor for medical advice about side effects. You may report side effects to FDA at 1-800-FDA-1088. Where should I keep my medicine? This drug is given in a hospital  or clinic and will not be stored at home. NOTE: This sheet is a summary. It may not cover all possible information. If you have questions about this medicine, talk to your doctor, pharmacist, or health care provider.  2019 Elsevier/Gold Standard (2014-04-28 14:19:39)

## 2019-02-17 ENCOUNTER — Inpatient Hospital Stay: Payer: Medicare Other

## 2019-02-17 ENCOUNTER — Other Ambulatory Visit: Payer: Self-pay | Admitting: Radiology

## 2019-02-17 ENCOUNTER — Other Ambulatory Visit: Payer: Self-pay | Admitting: Student

## 2019-02-17 VITALS — BP 145/73 | HR 84 | Temp 98.7°F | Resp 16

## 2019-02-17 DIAGNOSIS — Z5112 Encounter for antineoplastic immunotherapy: Secondary | ICD-10-CM | POA: Diagnosis not present

## 2019-02-17 DIAGNOSIS — Z7189 Other specified counseling: Secondary | ICD-10-CM

## 2019-02-17 DIAGNOSIS — C9000 Multiple myeloma not having achieved remission: Secondary | ICD-10-CM

## 2019-02-17 MED ORDER — PROCHLORPERAZINE MALEATE 10 MG PO TABS
ORAL_TABLET | ORAL | Status: AC
  Filled 2019-02-17: qty 1

## 2019-02-17 MED ORDER — DEXAMETHASONE 4 MG PO TABS
ORAL_TABLET | ORAL | Status: AC
  Filled 2019-02-17: qty 5

## 2019-02-17 MED ORDER — PROCHLORPERAZINE MALEATE 10 MG PO TABS
10.0000 mg | ORAL_TABLET | Freq: Once | ORAL | Status: AC
  Administered 2019-02-17: 10 mg via ORAL

## 2019-02-17 MED ORDER — SODIUM CHLORIDE 0.9 % IV SOLN
Freq: Once | INTRAVENOUS | Status: AC
  Administered 2019-02-17: 13:00:00 via INTRAVENOUS
  Filled 2019-02-17: qty 250

## 2019-02-17 MED ORDER — ACETAMINOPHEN 325 MG PO TABS
650.0000 mg | ORAL_TABLET | Freq: Once | ORAL | Status: AC
  Administered 2019-02-17: 650 mg via ORAL

## 2019-02-17 MED ORDER — DEXTROSE 5 % IV SOLN
27.0000 mg/m2 | Freq: Once | INTRAVENOUS | Status: AC
  Administered 2019-02-17: 50 mg via INTRAVENOUS
  Filled 2019-02-17: qty 15

## 2019-02-17 MED ORDER — ACETAMINOPHEN 325 MG PO TABS
ORAL_TABLET | ORAL | Status: AC
  Filled 2019-02-17: qty 2

## 2019-02-17 MED ORDER — DEXAMETHASONE 4 MG PO TABS
20.0000 mg | ORAL_TABLET | Freq: Once | ORAL | Status: AC
  Administered 2019-02-17: 20 mg via ORAL

## 2019-02-17 NOTE — Patient Instructions (Signed)
[facility] Discharge Instructions for Patients Receiving Chemotherapy  Today you received the following chemotherapy agents: Carfilzomib (Kyprolis)  To help prevent nausea and vomiting after your treatment, we encourage you to take your nausea medication as directed.    If you develop nausea and vomiting that is not controlled by your nausea medication, call the clinic.   BELOW ARE SYMPTOMS THAT SHOULD BE REPORTED IMMEDIATELY:  *FEVER GREATER THAN 100.5 F  *CHILLS WITH OR WITHOUT FEVER  NAUSEA AND VOMITING THAT IS NOT CONTROLLED WITH YOUR NAUSEA MEDICATION  *UNUSUAL SHORTNESS OF BREATH  *UNUSUAL BRUISING OR BLEEDING  TENDERNESS IN MOUTH AND THROAT WITH OR WITHOUT PRESENCE OF ULCERS  *URINARY PROBLEMS  *BOWEL PROBLEMS  UNUSUAL RASH Items with * indicate a potential emergency and should be followed up as soon as possible.  Feel free to call the clinic should you have any questions or concerns. The clinic phone number is [phone].  Please show the [facility] at check-in to the Emergency Department and triage nurse.  Zoledronic Acid injection (Hypercalcemia, Oncology) What is this medicine? ZOLEDRONIC ACID (ZOE le dron ik AS id) lowers the amount of calcium loss from bone. It is used to treat too much calcium in your blood from cancer. It is also used to prevent complications of cancer that has spread to the bone. This medicine may be used for other purposes; ask your health care provider or pharmacist if you have questions. COMMON BRAND NAME(S): Zometa What should I tell my health care provider before I take this medicine? They need to know if you have any of these conditions: -aspirin-sensitive asthma -cancer, especially if you are receiving medicines used to treat cancer -dental disease or wear dentures -infection -kidney disease -receiving corticosteroids like dexamethasone or prednisone -an unusual or allergic reaction to zoledronic acid,  other medicines, foods, dyes, or preservatives -pregnant or trying to get pregnant -breast-feeding How should I use this medicine? This medicine is for infusion into a vein. It is given by a health care professional in a hospital or clinic setting. Talk to your pediatrician regarding the use of this medicine in children. Special care may be needed. Overdosage: If you think you have taken too much of this medicine contact a poison control center or emergency room at once. NOTE: This medicine is only for you. Do not share this medicine with others. What if I miss a dose? It is important not to miss your dose. Call your doctor or health care professional if you are unable to keep an appointment. What may interact with this medicine? -certain antibiotics given by injection -NSAIDs, medicines for pain and inflammation, like ibuprofen or naproxen -some diuretics like bumetanide, furosemide -teriparatide -thalidomide This list may not describe all possible interactions. Give your health care provider a list of all the medicines, herbs, non-prescription drugs, or dietary supplements you use. Also tell them if you smoke, drink alcohol, or use illegal drugs. Some items may interact with your medicine. What should I watch for while using this medicine? Visit your doctor or health care professional for regular checkups. It may be some time before you see the benefit from this medicine. Do not stop taking your medicine unless your doctor tells you to. Your doctor may order blood tests or other tests to see how you are doing. Women should inform their doctor if they wish to become pregnant or think they might be pregnant. There is a potential for serious side effects to an unborn child.  Talk to your health care professional or pharmacist for more information. You should make sure that you get enough calcium and vitamin D while you are taking this medicine. Discuss the foods you eat and the vitamins you take  with your health care professional. Some people who take this medicine have severe bone, joint, and/or muscle pain. This medicine may also increase your risk for jaw problems or a broken thigh bone. Tell your doctor right away if you have severe pain in your jaw, bones, joints, or muscles. Tell your doctor if you have any pain that does not go away or that gets worse. Tell your dentist and dental surgeon that you are taking this medicine. You should not have major dental surgery while on this medicine. See your dentist to have a dental exam and fix any dental problems before starting this medicine. Take good care of your teeth while on this medicine. Make sure you see your dentist for regular follow-up appointments. What side effects may I notice from receiving this medicine? Side effects that you should report to your doctor or health care professional as soon as possible: -allergic reactions like skin rash, itching or hives, swelling of the face, lips, or tongue -anxiety, confusion, or depression -breathing problems -changes in vision -eye pain -feeling faint or lightheaded, falls -jaw pain, especially after dental work -mouth sores -muscle cramps, stiffness, or weakness -redness, blistering, peeling or loosening of the skin, including inside the mouth -trouble passing urine or change in the amount of urine Side effects that usually do not require medical attention (report to your doctor or health care professional if they continue or are bothersome): -bone, joint, or muscle pain -constipation -diarrhea -fever -hair loss -irritation at site where injected -loss of appetite -nausea, vomiting -stomach upset -trouble sleeping -trouble swallowing -weak or tired This list may not describe all possible side effects. Call your doctor for medical advice about side effects. You may report side effects to FDA at 1-800-FDA-1088. Where should I keep my medicine? This drug is given in a hospital  or clinic and will not be stored at home. NOTE: This sheet is a summary. It may not cover all possible information. If you have questions about this medicine, talk to your doctor, pharmacist, or health care provider.  2019 Elsevier/Gold Standard (2014-04-28 14:19:39)

## 2019-02-20 ENCOUNTER — Ambulatory Visit (HOSPITAL_COMMUNITY)
Admission: RE | Admit: 2019-02-20 | Discharge: 2019-02-20 | Disposition: A | Discharge status: Home / Self Care | Payer: Medicare Other | Source: Ambulatory Visit | Attending: Hematology | Admitting: Hematology

## 2019-02-20 ENCOUNTER — Other Ambulatory Visit: Payer: Self-pay

## 2019-02-20 ENCOUNTER — Encounter (HOSPITAL_COMMUNITY): Payer: Self-pay

## 2019-02-20 DIAGNOSIS — H3530 Unspecified macular degeneration: Secondary | ICD-10-CM | POA: Diagnosis not present

## 2019-02-20 DIAGNOSIS — C9000 Multiple myeloma not having achieved remission: Secondary | ICD-10-CM | POA: Insufficient documentation

## 2019-02-20 DIAGNOSIS — J45909 Unspecified asthma, uncomplicated: Secondary | ICD-10-CM | POA: Insufficient documentation

## 2019-02-20 DIAGNOSIS — E785 Hyperlipidemia, unspecified: Secondary | ICD-10-CM | POA: Diagnosis not present

## 2019-02-20 DIAGNOSIS — Z882 Allergy status to sulfonamides status: Secondary | ICD-10-CM | POA: Insufficient documentation

## 2019-02-20 DIAGNOSIS — E119 Type 2 diabetes mellitus without complications: Secondary | ICD-10-CM | POA: Insufficient documentation

## 2019-02-20 DIAGNOSIS — M8580 Other specified disorders of bone density and structure, unspecified site: Secondary | ICD-10-CM | POA: Diagnosis not present

## 2019-02-20 DIAGNOSIS — Z79899 Other long term (current) drug therapy: Secondary | ICD-10-CM | POA: Insufficient documentation

## 2019-02-20 DIAGNOSIS — E669 Obesity, unspecified: Secondary | ICD-10-CM | POA: Diagnosis not present

## 2019-02-20 DIAGNOSIS — Z886 Allergy status to analgesic agent status: Secondary | ICD-10-CM | POA: Insufficient documentation

## 2019-02-20 DIAGNOSIS — Z7984 Long term (current) use of oral hypoglycemic drugs: Secondary | ICD-10-CM | POA: Insufficient documentation

## 2019-02-20 DIAGNOSIS — Z880 Allergy status to penicillin: Secondary | ICD-10-CM | POA: Diagnosis not present

## 2019-02-20 DIAGNOSIS — Z7982 Long term (current) use of aspirin: Secondary | ICD-10-CM | POA: Insufficient documentation

## 2019-02-20 DIAGNOSIS — Z79891 Long term (current) use of opiate analgesic: Secondary | ICD-10-CM | POA: Diagnosis not present

## 2019-02-20 HISTORY — PX: IR IMAGING GUIDED PORT INSERTION: IMG5740

## 2019-02-20 LAB — CBC
HCT: 39.7 % (ref 36.0–46.0)
Hemoglobin: 12.9 g/dL (ref 12.0–15.0)
MCH: 30.7 pg (ref 26.0–34.0)
MCHC: 32.5 g/dL (ref 30.0–36.0)
MCV: 94.5 fL (ref 80.0–100.0)
NRBC: 0.8 % — AB (ref 0.0–0.2)
Platelets: 181 10*3/uL (ref 150–400)
RBC: 4.2 MIL/uL (ref 3.87–5.11)
RDW: 12.4 % (ref 11.5–15.5)
WBC: 5.2 10*3/uL (ref 4.0–10.5)

## 2019-02-20 LAB — PROTIME-INR
INR: 1 (ref 0.8–1.2)
Prothrombin Time: 13.2 seconds (ref 11.4–15.2)

## 2019-02-20 LAB — BASIC METABOLIC PANEL
Anion gap: 10 (ref 5–15)
BUN: 21 mg/dL (ref 8–23)
CO2: 23 mmol/L (ref 22–32)
Calcium: 10.6 mg/dL — ABNORMAL HIGH (ref 8.9–10.3)
Chloride: 104 mmol/L (ref 98–111)
Creatinine, Ser: 0.59 mg/dL (ref 0.44–1.00)
GFR calc Af Amer: >60 mL/min (ref >60)
GFR calc non Af Amer: >60 mL/min (ref >60)
Glucose, Bld: 196 mg/dL — ABNORMAL HIGH (ref 70–99)
Potassium: 3.8 mmol/L (ref 3.5–5.1)
Sodium: 137 mmol/L (ref 135–145)

## 2019-02-20 LAB — GLUCOSE, CAPILLARY: Glucose-Capillary: 210 mg/dL — ABNORMAL HIGH (ref 70–99)

## 2019-02-20 MED ORDER — MIDAZOLAM HCL 2 MG/2ML IJ SOLN
INTRAMUSCULAR | Status: AC
  Filled 2019-02-20: qty 2

## 2019-02-20 MED ORDER — FENTANYL CITRATE (PF) 100 MCG/2ML IJ SOLN
INTRAMUSCULAR | Status: AC | PRN
  Administered 2019-02-20 (×2): 50 ug via INTRAVENOUS

## 2019-02-20 MED ORDER — LIDOCAINE-EPINEPHRINE (PF) 2 %-1:200000 IJ SOLN
INTRAMUSCULAR | Status: AC
  Filled 2019-02-20: qty 20

## 2019-02-20 MED ORDER — VANCOMYCIN HCL IN DEXTROSE 1-5 GM/200ML-% IV SOLN
INTRAVENOUS | Status: AC
  Administered 2019-02-20: 1000 mg via INTRAVENOUS
  Filled 2019-02-20: qty 200

## 2019-02-20 MED ORDER — VANCOMYCIN HCL IN DEXTROSE 1-5 GM/200ML-% IV SOLN
1000.0000 mg | INTRAVENOUS | Status: AC
  Administered 2019-02-20: 1000 mg via INTRAVENOUS

## 2019-02-20 MED ORDER — MIDAZOLAM HCL 2 MG/2ML IJ SOLN
INTRAMUSCULAR | Status: AC | PRN
  Administered 2019-02-20 (×2): 1 mg via INTRAVENOUS

## 2019-02-20 MED ORDER — VANCOMYCIN HCL IN DEXTROSE 1-5 GM/200ML-% IV SOLN
INTRAVENOUS | Status: AC
  Filled 2019-02-20: qty 200

## 2019-02-20 MED ORDER — SODIUM CHLORIDE 0.9 % IV SOLN
INTRAVENOUS | Status: DC
  Administered 2019-02-20: 13:00:00 via INTRAVENOUS

## 2019-02-20 MED ORDER — HEPARIN SOD (PORK) LOCK FLUSH 100 UNIT/ML IV SOLN
INTRAVENOUS | Status: AC
  Filled 2019-02-20: qty 5

## 2019-02-20 MED ORDER — FENTANYL CITRATE (PF) 100 MCG/2ML IJ SOLN
INTRAMUSCULAR | Status: AC
  Filled 2019-02-20: qty 2

## 2019-02-20 NOTE — H&P (Signed)
Referring Physician(s): Brunetta Genera  Supervising Physician: Jacqulynn Cadet  Patient Status:  [facility]  Chief Complaint:  "I'm here for a port a cath"  Subjective: Patient familiar to IR service from bone marrow biopsy on 01/06/2019.  She has a history of newly diagnosed multiple myeloma and presents again today for Port-A-Cath placement for additional chemotherapy.  She currently denies fever, headache, chest pain, dyspnea, cough, abdominal/back pain, nausea, vomiting or bleeding.  Additional history as below.  Past Medical History:  Diagnosis Date  . Allergy    seasonal  . Asthma   . DEPRESSION   . DIABETES MELLITUS, TYPE II   . Diverticulosis   . HYPERLIPIDEMIA   . Macular degeneration of left eye    mild, Dr.Hecker  . Obesity, unspecified   . Osteoarthritis of both knees   . OSTEOPENIA   . Osteopenia   . URINARY INCONTINENCE    Past Surgical History:  Procedure Laterality Date  . BREAST SURGERY    . CATARACT EXTRACTION Left 05/24/2018  . CESAREAN SECTION  01/1973  . FRACTURE SURGERY    . left wrist surgery  2008   By Dr. Latanya Maudlin  . right ankle  1994      Allergies: Penicillins; Aleve [naproxen sodium]; and Sulfonamide derivatives  Medications: Prior to Admission medications   Medication Sig Start Date End Date Taking? Authorizing Provider  acyclovir (ZOVIRAX) 400 MG tablet Take 1 tablet (400 mg total) by mouth 2 (two) times daily. 01/16/19   Brunetta Genera, MD  albuterol (PROVENTIL HFA;VENTOLIN HFA) 108 (90 Base) MCG/ACT inhaler INHALE 1 TO 2 PUFFS INTO THE LUNGS EVERY 6 HOURS AS NEEDED FOR WHEEZING OR SHORTNESS OF BREATH Patient not taking: No sig reported 03/09/18   Hoyt Koch, MD  aspirin EC 81 MG tablet Take 81 mg by mouth daily after breakfast.     [provider]  Blood Glucose Monitoring Suppl (FREESTYLE FREEDOM LITE) W/DEVICE KIT Use to check blood sugars twice a day Dx 250.00 06/01/14   Rowe Clack, MD    Calcium Carbonate-Vitamin D (CALCIUM 600+D HIGH POTENCY) 600-400 MG-UNIT per tablet Take 1 tablet by mouth 2 (two) times daily.     [provider]  Continuous Blood Gluc Sensor (FREESTYLE LIBRE 14 DAY SENSOR) MISC 1 each by Does not apply route every 14 (fourteen) days. 02/07/19   Hoyt Koch, MD  dexamethasone (DECADRON) 4 MG tablet Take 5 tablets (20 mg total) by mouth once a week. On D1,8 and 15 of each cycle of treatment Patient taking differently: Take 20 mg by mouth See admin instructions. On Day 1,8 and 15 of each cycle of treatment 01/16/19   Brunetta Genera, MD  fentaNYL (DURAGESIC) 12 MCG/HR Place 1 patch onto the skin every 3 (three) days. 02/02/19   Brunetta Genera, MD  fluticasone (FLONASE) 50 MCG/ACT nasal spray Place 1 spray into both nostrils daily. Patient taking differently: Place 1 spray into both nostrils daily as needed for allergies or rhinitis.  03/09/18   Hoyt Koch, MD  glucose blood (FREESTYLE LITE) test strip CHECK BLOOD SUGAR TWICE DAILY AS DIRECTED Dx 250.00 07/13/14   Rowe Clack, MD  Lancets (FREESTYLE) lancets Use twice daily to check sugars. 04/15/16   Hoyt Koch, MD  lenalidomide (REVLIMID) 15 MG capsule Take 1 capsule (15 mg total) by mouth daily. Take for 21 days on, 7 days off, repeat every 28 days. [employer] PJAS#[id number] 02/03/19 Patient not taking: Reported on  02/07/2019 02/06/19   Brunetta Genera, MD  metFORMIN (GLUCOPHAGE-XR) 500 MG 24 hr tablet Take 3 tablets (1,500 mg total) by mouth daily with breakfast. Patient taking differently: Take 500-1,000 mg by mouth See admin instructions. 500 mg every morning, 1,000 mg every night 06/06/18   Hoyt Koch, MD  montelukast (SINGULAIR) 10 MG tablet TAKE 1 TABLET BY MOUTH DAILY AS NEEDED Patient taking differently: Take 10 mg by mouth at bedtime as needed (allergies).  05/11/18   Hoyt Koch, MD  Multiple Vitamins-Minerals (ICAPS) CAPS Take 1  capsule by mouth daily after breakfast.     [provider]  ondansetron (ZOFRAN) 8 MG tablet Take 1 tablet (8 mg total) by mouth 2 (two) times daily as needed (Nausea or vomiting). 01/16/19   Brunetta Genera, MD  oxyCODONE 10 MG TABS Take 1 tablet (10 mg total) by mouth every 6 (six) hours as needed for moderate pain or severe pain. 01/31/19   Brunetta Genera, MD  pantoprazole (PROTONIX) 20 MG tablet TAKE 1 TABLET(20 MG) BY MOUTH DAILY Patient not taking: No sig reported 11/19/17   Hoyt Koch, MD  polyethylene glycol (MIRALAX / GLYCOLAX) packet Take 17 g by mouth daily after breakfast.     [provider]  prochlorperazine (COMPAZINE) 10 MG tablet Take 1 tablet (10 mg total) by mouth every 6 (six) hours as needed (Nausea or vomiting). 01/16/19   Brunetta Genera, MD  senna-docusate (SENNA S) 8.6-50 MG tablet Take 2 tablets by mouth at bedtime. 01/10/19   Brunetta Genera, MD  sertraline (ZOLOFT) 50 MG tablet TAKE 1 TABLET BY MOUTH DAILY Patient taking differently: Take 50 mg by mouth at bedtime.  11/01/18   Hoyt Koch, MD  simvastatin (ZOCOR) 20 MG tablet TAKE 1 TABLET(20 MG) BY MOUTH DAILY Patient taking differently: Take 20 mg by mouth every evening.  11/01/18   Hoyt Koch, MD  Vitamin D, Ergocalciferol, (DRISDOL) 1.25 MG (50000 UT) CAPS capsule Take 1 capsule (50,000 Units total) by mouth every 7 (seven) days. 12/12/18   Hoyt Koch, MD     Vital Signs: LMP 10/09/2012  Blood pressure 120/78, heart rate 104, respirations 18, O2 sats 97% room air, temp 98.3  Physical Exam patient awake, alert.  Chest clear to auscultation bilaterally; heart with tachycardic but regular rhythm.  Abdomen soft, bowel sounds, nontender.  No lower extremity edema.  Imaging: No results found.  Labs:  CBC: Recent Labs    02/04/19 2300 02/05/19 0435 02/09/19 1337 02/16/19 1138  WBC 4.8 4.1 6.0 6.2  HGB 11.5* 10.7* 12.6 12.8  HCT  34.2* 33.1* 37.7 37.7  PLT 144* 136* 192 170    COAGS: Recent Labs    01/06/19 0757  INR 0.94  APTT 27    BMP: Recent Labs    02/04/19 2300 02/05/19 0435 02/09/19 1337 02/16/19 1138  NA 130* 138 138 135  K 3.7 4.1 4.1 4.2  CL 101 110 103 103  CO2 22 21* 22 23  GLUCOSE 206* 242* 177* 260*  BUN _0 CALCIUM 9.1 8.8* 10.2 10.2  CREATININE 0.60 0.67 0.69 0.63  GFRNONAA >60 >60 >60 >60  GFRAA >60 >60 >60 >60    LIVER FUNCTION TESTS: Recent Labs    02/02/19 1305 02/04/19 2300 02/09/19 1337 02/16/19 1138  BILITOT 0.5 0.6 0.7 0.6  AST 63* 64* 50* 41  ALT 62* 61* 62* 47*  ALKPHOS 117 97 106 99  PROT  6.7 6.2* 6.5 6.3*  ALBUMIN 3.7 3.8 3.5 3.4*    Assessment and Plan: Patient with history of newly diagnosed multiple myeloma; presents today for Port-A-Cath placement for additional chemotherapy.Risks and benefits of image guided port-a-catheter placement was discussed with the patient including, but not limited to bleeding, infection, pneumothorax, or fibrin sheath development and need for additional procedures.  All of the patient's questions were answered, patient is agreeable to proceed. Consent signed and in chart.  LABS PENDING   Electronically Signed: D. Rowe Robert, PA-C 02/20/2019, 12:52 PM   I spent a total of 20 minutes at the the patient's bedside AND on the patient's hospital floor or unit, greater than 50% of which was counseling/coordinating care for Port-A-Cath placement

## 2019-02-20 NOTE — Discharge Instructions (Addendum)
Implanted Port Home Guide °An implanted port is a device that is placed under the skin. It is usually placed in the chest. The device can be used to give IV medicine, to take blood, or for dialysis. You may have an implanted port if: °· You need IV medicine that would be irritating to the small veins in your hands or arms. °· You need IV medicines, such as antibiotics, for a long period of time. °· You need IV nutrition for a long period of time. °· You need dialysis. °Having a port means that your health care provider will not need to use the veins in your arms for these procedures. You may have fewer limitations when using a port than you would if you used other types of long-term IVs, and you will likely be able to return to normal activities after your incision heals. °An implanted port has two main parts: °· Reservoir. The reservoir is the part where a needle is inserted to give medicines or draw blood. The reservoir is round. After it is placed, it appears as a small, raised area under your skin. °· Catheter. The catheter is a thin, flexible tube that connects the reservoir to a vein. Medicine that is inserted into the reservoir goes into the catheter and then into the vein. °How is my port accessed? °To access your port: °· A numbing cream may be placed on the skin over the port site. °· Your health care provider will put on a mask and sterile gloves. °· The skin over your port will be cleaned carefully with a germ-killing soap and allowed to dry. °· Your health care provider will gently pinch the port and insert a needle into it. °· Your health care provider will check for a blood return to make sure the port is in the vein and is not clogged. °· If your port needs to remain accessed to get medicine continuously (constant infusion), your health care provider will place a clear bandage (dressing) over the needle site. The dressing and needle will need to be changed every week, or as told by your health care  provider. °What is flushing? °Flushing helps keep the port from getting clogged. Follow instructions from your health care provider about how and when to flush the port. Ports are usually flushed with saline solution or a medicine called heparin. The need for flushing will depend on how the port is used: °· If the port is only used from time to time to give medicines or draw blood, the port may need to be flushed: °? Before and after medicines have been given. °? Before and after blood has been drawn. °? As part of routine maintenance. Flushing may be recommended every 4-6 weeks. °· If a constant infusion is running, the port may not need to be flushed. °· Throw away any syringes in a disposal container that is meant for sharp items (sharps container). You can buy a sharps container from a pharmacy, or you can make one by using an empty hard plastic bottle with a cover. °How long will my port stay implanted? °The port can stay in for as long as your health care provider thinks it is needed. When it is time for the port to come out, a surgery will be done to remove it. The surgery will be similar to the procedure that was done to put the port in. °Follow these instructions at home: ° °· Flush your port as told by your health care provider. °·   If you need an infusion over several days, follow instructions from your health care provider about how to take care of your port site. Make sure you: ? Wash your hands with soap and water before you change your dressing. If soap and water are not available, use alcohol-based hand sanitizer. ? Change your dressing as told by your health care provider. ? Place any used dressings or infusion bags into a plastic bag. Throw that bag in the trash. ? Keep the dressing that covers the needle clean and dry. Do not get it wet.  You may remove your dressing tomorrow.  DO NOT use EMLA cream for 2 weeks after port placement as this cream will remove surgical glue on your incision. ? Do  not use scissors or sharp objects near the tube. ? Keep the tube clamped, unless it is being used.  Check your port site every day for signs of infection. Check for: ? Redness, swelling, or pain. ? Fluid or blood. ? Pus or a bad smell.  Protect the skin around the port site. ? Avoid wearing bra straps that rub or irritate the site. ? Protect the skin around your port from seat belts. Place a soft pad over your chest if needed.  Bathe or shower as told by your health care provider. The site may get wet as long as you are not actively receiving an infusion.  You may shower tomorrow.  Return to your normal activities as told by your health care provider. Ask your health care provider what activities are safe for you.  Carry a medical alert card or wear a medical alert bracelet at all times. This will let health care providers know that you have an implanted port in case of an emergency. Get help right away if:  You have redness, swelling, or pain at the port site.  You have fluid or blood coming from your port site.  You have pus or a bad smell coming from the port site.  You have a fever. Summary  Implanted ports are usually placed in the chest for long-term IV access.  Follow instructions from your health care provider about flushing the port and changing bandages (dressings).  Take care of the area around your port by avoiding clothing that puts pressure on the area, and by watching for signs of infection.  Protect the skin around your port from seat belts. Place a soft pad over your chest if needed.  Get help right away if you have a fever or you have redness, swelling, pain, drainage, or a bad smell at the port site. This information is not intended to replace advice given to you by your health care provider. Make sure you discuss any questions you have with your health care provider. Document Released: 11/30/2005 Document Revised: 01/02/2017 Document Reviewed:  01/02/2017 Elsevier Interactive Patient Education  2019 [place]. Moderate Conscious Sedation, Adult, Care After These instructions provide you with information about caring for yourself after your procedure. Your health care provider may also give you more specific instructions. Your treatment has been planned according to current medical practices, but problems sometimes occur. Call your health care provider if you have any problems or questions after your procedure. What can I expect after the procedure? After your procedure, it is common:  To feel sleepy for several hours.  To feel clumsy and have poor balance for several hours.  To have poor judgment for several hours.  To vomit if you eat too soon. Follow these  instructions at home: For at least 24 hours after the procedure:   Do not: ? Participate in activities where you could fall or become injured. ? Drive. ? Use heavy machinery. ? Drink alcohol. ? Take sleeping pills or medicines that cause drowsiness. ? Make important decisions or sign legal documents. ? Take care of children on your own.  Rest. Eating and drinking  Follow the diet recommended by your health care provider.  If you vomit: ? Drink water, juice, or soup when you can drink without vomiting. ? Make sure you have little or no nausea before eating solid foods. General instructions  Have a responsible adult stay with you until you are awake and alert.  Take over-the-counter and prescription medicines only as told by your health care provider.  If you smoke, do not smoke without supervision.  Keep all follow-up visits as told by your health care provider. This is important. Contact a health care provider if:  You keep feeling nauseous or you keep vomiting.  You feel light-headed.  You develop a rash.  You have a fever. Get help right away if:  You have trouble breathing. This information is not intended to replace advice given to you by  your health care provider. Make sure you discuss any questions you have with your health care provider. Document Released: 09/20/2013 Document Revised: 05/04/2016 Document Reviewed: 03/21/2016 Elsevier Interactive Patient Education  2019 [employer].

## 2019-02-20 NOTE — Procedures (Signed)
Interventional Radiology Procedure Note  Procedure: Placement of a right IJ approach single lumen PowerPort.  Tip is positioned at the superior cavoatrial junction and catheter is ready for immediate use.  Complications: No immediate Recommendations:  - Ok to shower tomorrow - Do not submerge for 7 days - Routine line care   Signed,  Icesis Renn K. Keon Benscoter, MD   

## 2019-02-21 ENCOUNTER — Encounter: Payer: Self-pay | Admitting: Hematology

## 2019-02-22 ENCOUNTER — Other Ambulatory Visit: Payer: Self-pay | Admitting: Hematology

## 2019-02-22 ENCOUNTER — Inpatient Hospital Stay: Payer: Medicare Other

## 2019-02-22 ENCOUNTER — Other Ambulatory Visit: Payer: Self-pay

## 2019-02-22 NOTE — Progress Notes (Signed)
Patient had no treatment on the schedule for today as confirmed by Dr. Grier Mitts desk nurse, Katharine Look, RN. Nurse came by the infusion room to speak with patient about her scheduled surgery for next week and subsequent treatment dates.

## 2019-02-23 ENCOUNTER — Other Ambulatory Visit: Payer: Self-pay | Admitting: Hematology

## 2019-02-23 ENCOUNTER — Encounter: Payer: Self-pay | Admitting: Hematology

## 2019-02-27 ENCOUNTER — Other Ambulatory Visit: Payer: Self-pay | Admitting: *Deleted

## 2019-02-27 ENCOUNTER — Telehealth: Payer: Self-pay | Admitting: *Deleted

## 2019-02-27 ENCOUNTER — Encounter: Payer: Self-pay | Admitting: Hematology

## 2019-02-27 MED ORDER — FENTANYL 12 MCG/HR TD PT72: 1.0000 | MEDICATED_PATCH | TRANSDERMAL | 0 refills | Status: DC

## 2019-02-27 NOTE — Telephone Encounter (Signed)
Patient called in response to MyChart message and concerns/questions: Answers in bold. "I want to keep my infusion appointments for March 19 and March 20 at the cancer center here in [place]."  Appointments remain on patient schedule "I will request a postponement of recommended surgery at [facility] until the end of my next 3week cycle. That way I will have a week to recover. And, maybe I could start the Revilid and make sure I am tolerating that prior to surgery." Advised that Dr. Irene Limbo will be given question regarding starting Revlimid on original start date 3/19 since surgery is now postponed.  "Please return my call so I have my dates right for the end of this next cycle" Per current treatment plan in chart, last day of cycle 2 infusion treatment is 03/17/2019. End of Cycle 2 is 03/29/2019. Patient verbalized understanding of all answers.  She requests refill of Fentanyl patch, put on last one yesterday.

## 2019-02-28 ENCOUNTER — Telehealth: Payer: Self-pay | Admitting: *Deleted

## 2019-02-28 NOTE — Telephone Encounter (Signed)
Per Dr. Irene Limbo, patient is to start Revlimid on #/19/2020 as originally scheduled now that surgery is postponed. Patient verbalized understanding.

## 2019-03-01 ENCOUNTER — Other Ambulatory Visit: Payer: Self-pay | Admitting: Hematology

## 2019-03-01 ENCOUNTER — Other Ambulatory Visit: Payer: Self-pay | Admitting: *Deleted

## 2019-03-01 DIAGNOSIS — C9000 Multiple myeloma not having achieved remission: Secondary | ICD-10-CM

## 2019-03-01 MED ORDER — LENALIDOMIDE 15 MG PO CAPS: 15.0000 mg | ORAL_CAPSULE | Freq: Every day | ORAL | 0 refills | Status: DC

## 2019-03-01 NOTE — Telephone Encounter (Signed)
Revlimid refilled per Dr. Irene Limbo office visit note 02/16/2019. Refill sent to [facility]) [place], [place] Auth# [id number], 03/01/2019

## 2019-03-02 ENCOUNTER — Inpatient Hospital Stay: Payer: Medicare Other

## 2019-03-02 ENCOUNTER — Other Ambulatory Visit: Payer: Medicare Other

## 2019-03-02 ENCOUNTER — Other Ambulatory Visit: Payer: Self-pay

## 2019-03-02 ENCOUNTER — Ambulatory Visit: Payer: Medicare Other | Admitting: Hematology

## 2019-03-02 VITALS — BP 124/65 | HR 97 | Temp 98.1°F | Resp 18

## 2019-03-02 DIAGNOSIS — C9000 Multiple myeloma not having achieved remission: Secondary | ICD-10-CM

## 2019-03-02 DIAGNOSIS — Z7189 Other specified counseling: Secondary | ICD-10-CM

## 2019-03-02 DIAGNOSIS — Z5112 Encounter for antineoplastic immunotherapy: Secondary | ICD-10-CM | POA: Diagnosis not present

## 2019-03-02 LAB — CMP (CANCER CENTER ONLY)
ALT: 46 U/L — ABNORMAL HIGH (ref 0–44)
AST: 38 U/L (ref 15–41)
Albumin: 3.3 g/dL — ABNORMAL LOW (ref 3.5–5.0)
Alkaline Phosphatase: 107 U/L (ref 38–126)
Anion gap: 12 (ref 5–15)
BUN: 10 mg/dL (ref 8–23)
CO2: 22 mmol/L (ref 22–32)
Calcium: 9.6 mg/dL (ref 8.9–10.3)
Chloride: 105 mmol/L (ref 98–111)
Creatinine: 0.75 mg/dL (ref 0.44–1.00)
GFR, Est AFR Am: >60 mL/min (ref >60)
GFR, Estimated: >60 mL/min (ref >60)
Glucose, Bld: 294 mg/dL — ABNORMAL HIGH (ref 70–99)
Potassium: 3.7 mmol/L (ref 3.5–5.1)
Sodium: 139 mmol/L (ref 135–145)
Total Bilirubin: 0.7 mg/dL (ref 0.3–1.2)
Total Protein: 6.2 g/dL — ABNORMAL LOW (ref 6.5–8.1)

## 2019-03-02 LAB — CBC WITH DIFFERENTIAL/PLATELET
Abs Immature Granulocytes: 0.03 10*3/uL (ref 0.00–0.07)
Basophils Absolute: 0 10*3/uL (ref 0.0–0.1)
Basophils Relative: 0 %
Eosinophils Absolute: 0.1 10*3/uL (ref 0.0–0.5)
Eosinophils Relative: 2 %
HCT: 36.3 % (ref 36.0–46.0)
Hemoglobin: 12.1 g/dL (ref 12.0–15.0)
Immature Granulocytes: 0 %
Lymphocytes Relative: 17 %
Lymphs Abs: 1.2 10*3/uL (ref 0.7–4.0)
MCH: 30.8 pg (ref 26.0–34.0)
MCHC: 33.3 g/dL (ref 30.0–36.0)
MCV: 92.4 fL (ref 80.0–100.0)
MONO ABS: 0.7 10*3/uL (ref 0.1–1.0)
Monocytes Relative: 10 %
Neutro Abs: 4.9 10*3/uL (ref 1.7–7.7)
Neutrophils Relative %: 71 %
Platelets: 253 10*3/uL (ref 150–400)
RBC: 3.93 MIL/uL (ref 3.87–5.11)
RDW: 12.6 % (ref 11.5–15.5)
WBC: 6.9 10*3/uL (ref 4.0–10.5)
nRBC: 0 % (ref 0.0–0.2)

## 2019-03-02 MED ORDER — SODIUM CHLORIDE 0.9 % IV SOLN
Freq: Once | INTRAVENOUS | Status: DC
  Filled 2019-03-02: qty 250

## 2019-03-02 MED ORDER — PROCHLORPERAZINE MALEATE 10 MG PO TABS
ORAL_TABLET | ORAL | Status: AC
  Filled 2019-03-02: qty 1

## 2019-03-02 MED ORDER — DEXAMETHASONE 4 MG PO TABS
20.0000 mg | ORAL_TABLET | Freq: Once | ORAL | Status: AC
  Administered 2019-03-02: 20 mg via ORAL

## 2019-03-02 MED ORDER — PROCHLORPERAZINE MALEATE 10 MG PO TABS
10.0000 mg | ORAL_TABLET | Freq: Once | ORAL | Status: AC
  Administered 2019-03-02: 10 mg via ORAL

## 2019-03-02 MED ORDER — DEXAMETHASONE 4 MG PO TABS
ORAL_TABLET | ORAL | Status: AC
  Filled 2019-03-02: qty 5

## 2019-03-02 MED ORDER — SODIUM CHLORIDE 0.9% FLUSH
10.0000 mL | INTRAVENOUS | Status: DC | PRN
  Administered 2019-03-02: 10 mL
  Filled 2019-03-02: qty 10

## 2019-03-02 MED ORDER — SODIUM CHLORIDE 0.9% FLUSH
10.0000 mL | Freq: Once | INTRAVENOUS | Status: AC | PRN
  Administered 2019-03-02: 10 mL
  Filled 2019-03-02: qty 10

## 2019-03-02 MED ORDER — DEXTROSE 5 % IV SOLN
27.0000 mg/m2 | Freq: Once | INTRAVENOUS | Status: AC
  Administered 2019-03-02: 50 mg via INTRAVENOUS
  Filled 2019-03-02: qty 15

## 2019-03-02 MED ORDER — HEPARIN SOD (PORK) LOCK FLUSH 100 UNIT/ML IV SOLN
500.0000 [IU] | Freq: Once | INTRAVENOUS | Status: AC | PRN
  Administered 2019-03-02: 500 [IU]
  Filled 2019-03-02: qty 5

## 2019-03-02 MED ORDER — ACETAMINOPHEN 325 MG PO TABS
ORAL_TABLET | ORAL | Status: AC
  Filled 2019-03-02: qty 2

## 2019-03-02 MED ORDER — ACETAMINOPHEN 325 MG PO TABS
650.0000 mg | ORAL_TABLET | Freq: Once | ORAL | Status: AC
  Administered 2019-03-02: 650 mg via ORAL

## 2019-03-02 MED ORDER — SODIUM CHLORIDE 0.9 % IV SOLN
Freq: Once | INTRAVENOUS | Status: AC
  Administered 2019-03-02: 14:00:00 via INTRAVENOUS
  Filled 2019-03-02: qty 250

## 2019-03-02 NOTE — Patient Instructions (Signed)
Coronavirus (COVID-19) Are you at risk?  Are you at risk for the Coronavirus (COVID-19)?  To be considered HIGH RISK for Coronavirus (COVID-19), you have to meet the following criteria:  . Traveled to [place], [place], [name], [place] or [place]; or in the [place] to [place], [place], [place], or [place]; and have fever, cough, and shortness of breath within the last 2 weeks of travel OR . Been in close contact with a person diagnosed with COVID-19 within the last 2 weeks and have fever, cough, and shortness of breath . IF YOU DO NOT MEET THESE CRITERIA, YOU ARE CONSIDERED LOW RISK FOR COVID-19.  What to do if you are HIGH RISK for COVID-19?  . If you are having a medical emergency, call 911. . Seek medical care right away. Before you go to a doctor's office, urgent care or emergency department, call ahead and tell them about your recent travel, contact with someone diagnosed with COVID-19, and your symptoms. You should receive instructions from your physician's office regarding next steps of care.  . When you arrive at healthcare provider, tell the healthcare staff immediately you have returned from visiting [place], [place], [place], [place] or [name]; or traveled in the [place] to [place], [place], [place], or [place]; in the last two weeks or you have been in close contact with a person diagnosed with COVID-19 in the last 2 weeks.   . Tell the health care staff about your symptoms: fever, cough and shortness of breath. . After you have been seen by a medical provider, you will be either: o Tested for (COVID-19) and discharged home on quarantine except to seek medical care if symptoms worsen, and asked to  - Stay home and avoid contact with others until you get your results (4-5 days)  - Avoid travel on public transportation if possible (such as bus, train, or airplane) or o Sent to the Emergency Department by EMS for evaluation, COVID-19 testing, and possible  admission depending on your condition and test results.  What to do if you are LOW RISK for COVID-19?  Reduce your risk of any infection by using the same precautions used for avoiding the common cold or flu:  . Wash your hands often with soap and warm water for at least 20 seconds.  If soap and water are not readily available, use an alcohol-based hand sanitizer with at least 60% alcohol.  . If coughing or sneezing, cover your mouth and nose by coughing or sneezing into the elbow areas of your shirt or coat, into a tissue or into your sleeve (not your hands). . Avoid shaking hands with others and consider head nods or verbal greetings only. . Avoid touching your eyes, nose, or mouth with unwashed hands.  . Avoid close contact with people who are sick. . Avoid places or events with large numbers of people in one location, like concerts or sporting events. . Carefully consider travel plans you have or are making. . If you are planning any travel outside or inside the [place], visit the CDC's Travelers' Health webpage for the latest health notices. . If you have some symptoms but not all symptoms, continue to monitor at home and seek medical attention if your symptoms worsen. . If you are having a medical emergency, call 911.   ADDITIONAL HEALTHCARE OPTIONS FOR PATIENTS  [facility] / e-Visit: [email or web address]         [facility]: [phone]  Onset   Urgent Care: [facility]: [place] Discharge Instructions for Patients Receiving Chemotherapy  Today you received the following chemotherapy agents: Carfilzomib (Kyprolis)  To help prevent nausea and vomiting after your treatment, we encourage you to take your nausea medication as directed.    If you develop nausea and vomiting that is not controlled by your nausea medication, call the clinic.    BELOW ARE SYMPTOMS THAT SHOULD BE REPORTED IMMEDIATELY:  *FEVER GREATER THAN 100.5 F  *CHILLS WITH OR WITHOUT FEVER  NAUSEA AND VOMITING THAT IS NOT CONTROLLED WITH YOUR NAUSEA MEDICATION  *UNUSUAL SHORTNESS OF BREATH  *UNUSUAL BRUISING OR BLEEDING  TENDERNESS IN MOUTH AND THROAT WITH OR WITHOUT PRESENCE OF ULCERS  *URINARY PROBLEMS  *BOWEL PROBLEMS  UNUSUAL RASH Items with * indicate a potential emergency and should be followed up as soon as possible.  Feel free to call the clinic should you have any questions or concerns. The clinic phone number is [phone].  Please show the [facility] at check-in to the Emergency Department and triage nurse.  Zoledronic Acid injection (Hypercalcemia, Oncology) What is this medicine? ZOLEDRONIC ACID (ZOE le dron ik AS id) lowers the amount of calcium loss from bone. It is used to treat too much calcium in your blood from cancer. It is also used to prevent complications of cancer that has spread to the bone. This medicine may be used for other purposes; ask your health care provider or pharmacist if you have questions. COMMON BRAND NAME(S): Zometa What should I tell my health care provider before I take this medicine? They need to know if you have any of these conditions: -aspirin-sensitive asthma -cancer, especially if you are receiving medicines used to treat cancer -dental disease or wear dentures -infection -kidney disease -receiving corticosteroids like dexamethasone or prednisone -an unusual or allergic reaction to zoledronic acid, other medicines, foods, dyes, or preservatives -pregnant or trying to get pregnant -breast-feeding How should I use this medicine? This medicine is for infusion into a vein. It is given by a health care professional in a hospital or clinic setting. Talk to your pediatrician regarding the use of this medicine in children. Special care may be needed. Overdosage: If you think you have taken  too much of this medicine contact a poison control center or emergency room at once. NOTE: This medicine is only for you. Do not share this medicine with others. What if I miss a dose? It is important not to miss your dose. Call your doctor or health care professional if you are unable to keep an appointment. What may interact with this medicine? -certain antibiotics given by injection -NSAIDs, medicines for pain and inflammation, like ibuprofen or naproxen -some diuretics like bumetanide, furosemide -teriparatide -thalidomide This list may not describe all possible interactions. Give your health care provider a list of all the medicines, herbs, non-prescription drugs, or dietary supplements you use. Also tell them if you smoke, drink alcohol, or use illegal drugs. Some items may interact with your medicine. What should I watch for while using this medicine? Visit your doctor or health care professional for regular checkups. It may be some time before you see the benefit from this medicine. Do not stop taking your medicine unless your doctor tells you to. Your doctor may order blood tests or other tests to see how you are doing. Women  should inform their doctor if they wish to become pregnant or think they might be pregnant. There is a potential for serious side effects to an unborn child. Talk to your health care professional or pharmacist for more information. You should make sure that you get enough calcium and vitamin D while you are taking this medicine. Discuss the foods you eat and the vitamins you take with your health care professional. Some people who take this medicine have severe bone, joint, and/or muscle pain. This medicine may also increase your risk for jaw problems or a broken thigh bone. Tell your doctor right away if you have severe pain in your jaw, bones, joints, or muscles. Tell your doctor if you have any pain that does not go away or that gets worse. Tell your dentist and  dental surgeon that you are taking this medicine. You should not have major dental surgery while on this medicine. See your dentist to have a dental exam and fix any dental problems before starting this medicine. Take good care of your teeth while on this medicine. Make sure you see your dentist for regular follow-up appointments. What side effects may I notice from receiving this medicine? Side effects that you should report to your doctor or health care professional as soon as possible: -allergic reactions like skin rash, itching or hives, swelling of the face, lips, or tongue -anxiety, confusion, or depression -breathing problems -changes in vision -eye pain -feeling faint or lightheaded, falls -jaw pain, especially after dental work -mouth sores -muscle cramps, stiffness, or weakness -redness, blistering, peeling or loosening of the skin, including inside the mouth -trouble passing urine or change in the amount of urine Side effects that usually do not require medical attention (report to your doctor or health care professional if they continue or are bothersome): -bone, joint, or muscle pain -constipation -diarrhea -fever -hair loss -irritation at site where injected -loss of appetite -nausea, vomiting -stomach upset -trouble sleeping -trouble swallowing -weak or tired This list may not describe all possible side effects. Call your doctor for medical advice about side effects. You may report side effects to FDA at 1-800-FDA-1088. Where should I keep my medicine? This drug is given in a hospital or clinic and will not be stored at home. NOTE: This sheet is a summary. It may not cover all possible information. If you have questions about this medicine, talk to your doctor, pharmacist, or health care provider.  2019 Elsevier/Gold Standard (2014-04-28 14:19:39)

## 2019-03-03 ENCOUNTER — Inpatient Hospital Stay: Payer: Medicare Other

## 2019-03-03 ENCOUNTER — Other Ambulatory Visit: Payer: Self-pay

## 2019-03-03 VITALS — BP 120/56 | HR 98 | Temp 98.3°F | Resp 18

## 2019-03-03 DIAGNOSIS — C9000 Multiple myeloma not having achieved remission: Secondary | ICD-10-CM

## 2019-03-03 DIAGNOSIS — Z5112 Encounter for antineoplastic immunotherapy: Secondary | ICD-10-CM | POA: Diagnosis not present

## 2019-03-03 DIAGNOSIS — Z7189 Other specified counseling: Secondary | ICD-10-CM

## 2019-03-03 LAB — MULTIPLE MYELOMA PANEL, SERUM
Albumin SerPl Elph-Mcnc: 3.3 g/dL (ref 2.9–4.4)
Albumin/Glob SerPl: 1.4 (ref 0.7–1.7)
Alpha 1: 0.2 g/dL (ref 0.0–0.4)
Alpha2 Glob SerPl Elph-Mcnc: 0.9 g/dL (ref 0.4–1.0)
B-Globulin SerPl Elph-Mcnc: 0.9 g/dL (ref 0.7–1.3)
Gamma Glob SerPl Elph-Mcnc: 0.4 g/dL (ref 0.4–1.8)
Globulin, Total: 2.4 g/dL (ref 2.2–3.9)
IgA: 57 mg/dL — ABNORMAL LOW (ref 64–422)
IgG (Immunoglobin G), Serum: 502 mg/dL — ABNORMAL LOW (ref 700–1600)
IgM (Immunoglobulin M), Srm: 14 mg/dL — ABNORMAL LOW (ref 26–217)
M Protein SerPl Elph-Mcnc: 0.2 g/dL — ABNORMAL HIGH
Total Protein ELP: 5.7 g/dL — ABNORMAL LOW (ref 6.0–8.5)

## 2019-03-03 LAB — KAPPA/LAMBDA LIGHT CHAINS
Kappa free light chain: 7.1 mg/L (ref 3.3–19.4)
Kappa, lambda light chain ratio: 0.48 (ref 0.26–1.65)
Lambda free light chains: 14.8 mg/L (ref 5.7–26.3)

## 2019-03-03 MED ORDER — ACETAMINOPHEN 325 MG PO TABS
ORAL_TABLET | ORAL | Status: AC
  Filled 2019-03-03: qty 2

## 2019-03-03 MED ORDER — HEPARIN SOD (PORK) LOCK FLUSH 100 UNIT/ML IV SOLN
500.0000 [IU] | Freq: Once | INTRAVENOUS | Status: AC | PRN
  Administered 2019-03-03: 500 [IU]
  Filled 2019-03-03: qty 5

## 2019-03-03 MED ORDER — SODIUM CHLORIDE 0.9 % IV SOLN
Freq: Once | INTRAVENOUS | Status: DC
  Filled 2019-03-03: qty 250

## 2019-03-03 MED ORDER — PROCHLORPERAZINE MALEATE 10 MG PO TABS
ORAL_TABLET | ORAL | Status: AC
  Filled 2019-03-03: qty 1

## 2019-03-03 MED ORDER — DEXAMETHASONE 4 MG PO TABS
ORAL_TABLET | ORAL | Status: AC
  Filled 2019-03-03: qty 5

## 2019-03-03 MED ORDER — DEXTROSE 5 % IV SOLN
27.0000 mg/m2 | Freq: Once | INTRAVENOUS | Status: AC
  Administered 2019-03-03: 50 mg via INTRAVENOUS
  Filled 2019-03-03: qty 10

## 2019-03-03 MED ORDER — SODIUM CHLORIDE 0.9 % IV SOLN
Freq: Once | INTRAVENOUS | Status: AC
  Administered 2019-03-03: 13:00:00 via INTRAVENOUS
  Filled 2019-03-03: qty 250

## 2019-03-03 MED ORDER — SODIUM CHLORIDE 0.9% FLUSH
10.0000 mL | INTRAVENOUS | Status: DC | PRN
  Administered 2019-03-03: 10 mL
  Filled 2019-03-03: qty 10

## 2019-03-03 MED ORDER — DEXAMETHASONE 4 MG PO TABS
20.0000 mg | ORAL_TABLET | Freq: Once | ORAL | Status: AC
  Administered 2019-03-03: 20 mg via ORAL

## 2019-03-03 MED ORDER — ACETAMINOPHEN 325 MG PO TABS
650.0000 mg | ORAL_TABLET | Freq: Once | ORAL | Status: AC
  Administered 2019-03-03: 650 mg via ORAL

## 2019-03-03 MED ORDER — LIDOCAINE-PRILOCAINE 2.5-2.5 % EX CREA: 1.0000 "application " | TOPICAL_CREAM | CUTANEOUS | 0 refills | Status: DC | PRN

## 2019-03-03 MED ORDER — PROCHLORPERAZINE MALEATE 10 MG PO TABS
10.0000 mg | ORAL_TABLET | Freq: Once | ORAL | Status: AC
  Administered 2019-03-03: 10 mg via ORAL

## 2019-03-03 NOTE — Patient Instructions (Signed)
[facility] Discharge Instructions for Patients Receiving Chemotherapy  Today you received the following chemotherapy agents: Carfilzomib (Kyprolis)  To help prevent nausea and vomiting after your treatment, we encourage you to take your nausea medication as directed.    If you develop nausea and vomiting that is not controlled by your nausea medication, call the clinic.   BELOW ARE SYMPTOMS THAT SHOULD BE REPORTED IMMEDIATELY:  *FEVER GREATER THAN 100.5 F  *CHILLS WITH OR WITHOUT FEVER  NAUSEA AND VOMITING THAT IS NOT CONTROLLED WITH YOUR NAUSEA MEDICATION  *UNUSUAL SHORTNESS OF BREATH  *UNUSUAL BRUISING OR BLEEDING  TENDERNESS IN MOUTH AND THROAT WITH OR WITHOUT PRESENCE OF ULCERS  *URINARY PROBLEMS  *BOWEL PROBLEMS  UNUSUAL RASH Items with * indicate a potential emergency and should be followed up as soon as possible.  Feel free to call the clinic should you have any questions or concerns. The clinic phone number is [phone].  Please show the [facility] at check-in to the Emergency Department and triage nurse.  Zoledronic Acid injection (Hypercalcemia, Oncology) What is this medicine? ZOLEDRONIC ACID (ZOE le dron ik AS id) lowers the amount of calcium loss from bone. It is used to treat too much calcium in your blood from cancer. It is also used to prevent complications of cancer that has spread to the bone. This medicine may be used for other purposes; ask your health care provider or pharmacist if you have questions. COMMON BRAND NAME(S): Zometa What should I tell my health care provider before I take this medicine? They need to know if you have any of these conditions: -aspirin-sensitive asthma -cancer, especially if you are receiving medicines used to treat cancer -dental disease or wear dentures -infection -kidney disease -receiving corticosteroids like dexamethasone or prednisone -an unusual or allergic reaction to zoledronic acid,  other medicines, foods, dyes, or preservatives -pregnant or trying to get pregnant -breast-feeding How should I use this medicine? This medicine is for infusion into a vein. It is given by a health care professional in a hospital or clinic setting. Talk to your pediatrician regarding the use of this medicine in children. Special care may be needed. Overdosage: If you think you have taken too much of this medicine contact a poison control center or emergency room at once. NOTE: This medicine is only for you. Do not share this medicine with others. What if I miss a dose? It is important not to miss your dose. Call your doctor or health care professional if you are unable to keep an appointment. What may interact with this medicine? -certain antibiotics given by injection -NSAIDs, medicines for pain and inflammation, like ibuprofen or naproxen -some diuretics like bumetanide, furosemide -teriparatide -thalidomide This list may not describe all possible interactions. Give your health care provider a list of all the medicines, herbs, non-prescription drugs, or dietary supplements you use. Also tell them if you smoke, drink alcohol, or use illegal drugs. Some items may interact with your medicine. What should I watch for while using this medicine? Visit your doctor or health care professional for regular checkups. It may be some time before you see the benefit from this medicine. Do not stop taking your medicine unless your doctor tells you to. Your doctor may order blood tests or other tests to see how you are doing. Women should inform their doctor if they wish to become pregnant or think they might be pregnant. There is a potential for serious side effects to an unborn child.  Talk to your health care professional or pharmacist for more information. You should make sure that you get enough calcium and vitamin D while you are taking this medicine. Discuss the foods you eat and the vitamins you take  with your health care professional. Some people who take this medicine have severe bone, joint, and/or muscle pain. This medicine may also increase your risk for jaw problems or a broken thigh bone. Tell your doctor right away if you have severe pain in your jaw, bones, joints, or muscles. Tell your doctor if you have any pain that does not go away or that gets worse. Tell your dentist and dental surgeon that you are taking this medicine. You should not have major dental surgery while on this medicine. See your dentist to have a dental exam and fix any dental problems before starting this medicine. Take good care of your teeth while on this medicine. Make sure you see your dentist for regular follow-up appointments. What side effects may I notice from receiving this medicine? Side effects that you should report to your doctor or health care professional as soon as possible: -allergic reactions like skin rash, itching or hives, swelling of the face, lips, or tongue -anxiety, confusion, or depression -breathing problems -changes in vision -eye pain -feeling faint or lightheaded, falls -jaw pain, especially after dental work -mouth sores -muscle cramps, stiffness, or weakness -redness, blistering, peeling or loosening of the skin, including inside the mouth -trouble passing urine or change in the amount of urine Side effects that usually do not require medical attention (report to your doctor or health care professional if they continue or are bothersome): -bone, joint, or muscle pain -constipation -diarrhea -fever -hair loss -irritation at site where injected -loss of appetite -nausea, vomiting -stomach upset -trouble sleeping -trouble swallowing -weak or tired This list may not describe all possible side effects. Call your doctor for medical advice about side effects. You may report side effects to FDA at 1-800-FDA-1088. Where should I keep my medicine? This drug is given in a hospital  or clinic and will not be stored at home. NOTE: This sheet is a summary. It may not cover all possible information. If you have questions about this medicine, talk to your doctor, pharmacist, or health care provider.  2019 [employer] (2014-04-28 14:19:39)

## 2019-03-04 ENCOUNTER — Other Ambulatory Visit: Payer: Self-pay | Admitting: Internal Medicine

## 2019-03-07 ENCOUNTER — Other Ambulatory Visit: Payer: Self-pay | Admitting: Hematology

## 2019-03-08 NOTE — Progress Notes (Signed)
HEMATOLOGY/ONCOLOGY CLINIC NOTE  Date of Service: 03/09/2019  Patient Care Team: Hoyt Koch, MD as PCP - General (Internal Medicine) Lafayette Dragon, MD (Inactive) as Consulting Physician (Gastroenterology) Megan Salon, MD as Consulting Physician (Gynecology) Melrose Nakayama, MD as Consulting Physician (Orthopedic Surgery) Deneise Lever, MD as Consulting Physician (Pulmonary Disease) [name], MD (Ophthalmology)  CHIEF COMPLAINTS/PURPOSE OF CONSULTATION:  myeloma  HISTORY OF PRESENTING ILLNESS:   Faith Orr is a wonderful 78 y.o. female who has been referred to [name] by Dr. Pricilla Holm for evaluation and management of Lytic bone lesions. She is accompanied today by her son in law, and her partner is present via phone. The pt reports that she is doing well overall.   The pt notes that 2-3 months ago while standing at the stove, and otherwise feeling normally, she felt "something snap that took her breath away" in her mid back as she stretched to get something. The pt notes that she saw a chiropractor twice due to her back stiffness, which did not help. The pt then described her new bone pains to her PCP on 12/05/18, and subsequent imaging, as noted below, revealed concerns for numerous bone lesions. She has begun 19m Fosamax. The pt notes that she had hip pain in 2018, and that an XR at that time did not reveal any lesions.  The pt notes that most of her pain is concentrated to her left shoulder presently, and with minimal movement of the arm. She endorses pain radiating into her left arm and notes that her hand is swollen in the mornings when she wakes up. She also endorses present back pain and right hip pain, worse when she walks. She has not yet seen orthopedics. The pt reports that she is needing to take 6037mAdvil every 4-6 hours as Tramadol alone has not been able to alleviate her pain. The pt denies any unexpected weight loss, fevers, chills, or night  sweats. The pt notes that her urine is a very dark color presently, but denies overt blood in the urine, underpants, nor tissue paper. The pt notes that in the last two weeks she has had some soreness in her head, but denies new headaches or changes in vision. The pt notes that she has been urinating more frequently overall, but has been trying to stay better hydrated as well.  The pt notes that she has been compliant with annual mammograms.   The pt notes that she had a cyst in her right breast which was removed in the past. She fractured her left wrist in 2008 after falling down stairs. The pt endorses history of fatty liver.   The pt denies ever smoking cigarettes and endorses significant second hand smoke exposure with a previous marriage.   Of note prior to the patient's visit today, pt has had a Bone Scan completed on 12/13/18 with results revealing Multifocal uptake throughout the skeleton, consistent with diffuse metastatic disease. Primary tumor is not specified. 2. Uptake in the proximal right femur, consistent with lytic lesions. 3. Uptake in the ribs bilaterally as described. 4. Lesions in the proximal left humerus. 5. Diffuse uptake throughout the skull consistent with metastatic disease. 6. Right paramedian uptake at the manubrium.  Most recent lab results (12/08/18) of CBC w/diff and CMP is as follows: all values are WNL except for Glucose at 279, BUN at 24, AST at 41, ALT at 46. 12/08/18 SPEP revealed all values WNL except for Total Protein at 6.0,  Albumin at 3.6, Gamma globulin at 0.7, and M spike at 0.5g  On review of systems, pt reports significant left shoulder pain, back pain, right hip pain, dark urine, and denies fevers, chills, night sweats, unexpected weight loss, changes in bowel habits, changes in breathing, cough, new respiratory symptoms, changes in vision, abdominal pains, leg swelling, and any other symptoms.   On PMHx the pt reports fatty liver, and denies blood clots.    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 Denies ever smoking.  On Family Hx the pt reports maternal grandmother with colon cancer. Father with bladder cancer and amyloidosis (pt notes that this could have been misdiagnosis). Mother with Protein S deficiency and polymyalgia rheumatica.  Interval History:   [name] returns today for management and evaluation of her newly diagnosed Multiple myeloma and C2D8 treatment. The patient's last visit with [name] was on 02/16/19. The pt reports that she is doing well overall.   The pt reports that she woke up this morning with itching on her back, which resolved on its own. She notes that she developed a fleeting inguinal rash today as well, which was itchy, and resolved on its own as well. She did not take anything for her rashes or itchiness, and denies presently having a rash. The pt began Revlimid with C2D1.  The pt notes that she is not having left shoulder pain, and notes that this is much improved. She denies having any hip or groin pain. The pt did see Dr. Mylo Red at [facility] in the interim, and was recommended to have a fixation surgery. However, the pt notes that she is not "pscyhologically" in a place to choose surgery. She will continue to consider this, and Dr Melvyn Neth suggestions, and will return to care on April 8.  The pt notes that she is eating and sleeping well.   Lab results today (03/09/19) of CBC w/diff is as follows: all values are WNL except for Abs immature granulocytes at 0.24k. 03/09/19 CMP is pending 03/02/19 MMP revealed all values WNL except for IgG at 502, IgA at 57, IgM at 14, Total Protein at 5.7, M Protein at 0.2  On review of systems, pt reports good energy levels, resolved shoulder and hip pains, eating well, sleeping well, and denies any other symptoms.  MEDICAL HISTORY:  Past Medical History:  Diagnosis Date   Allergy    seasonal   Asthma    DEPRESSION    DIABETES  MELLITUS, TYPE II    Diverticulosis    HYPERLIPIDEMIA    Macular degeneration of left eye    mild, Dr.Hecker   Obesity, unspecified    Osteoarthritis of both knees    OSTEOPENIA    Osteopenia    URINARY INCONTINENCE     SURGICAL HISTORY: Past Surgical History:  Procedure Laterality Date   BREAST SURGERY     CATARACT EXTRACTION Left 05/24/2018   CESAREAN SECTION  01/1973   FRACTURE SURGERY     IR IMAGING GUIDED PORT INSERTION  02/20/2019   left wrist surgery  2008   By Dr. Latanya Maudlin   right ankle  1994    SOCIAL HISTORY: Social History   Socioeconomic History   Marital status: Married    Spouse name: Not on file   Number of children: 1   Years of education: Not on file   Highest education level: Not on file  Occupational History    Employer: [employer]  Social Needs   Emergency planning/management officer strain: Patient refused   Food insecurity:    Worry: Patient refused    Inability: Patient refused   Transportation needs:    Medical: Patient refused    Non-medical: Patient refused  Tobacco Use   Smoking status: Never Smoker   Smokeless tobacco: Never Used   Tobacco comment: Lives with partner [name]) and son  Substance and Sexual Activity   Alcohol use: No    Alcohol/week: 0.0 standard drinks   Drug use: No   Sexual activity: Never    Partners: Female    Birth control/protection: Post-menopausal    Comment: Lives with female partner (annette hicks) and 20 yo son  Lifestyle   Physical activity:    Days per week: Patient refused    Minutes per session: Patient refused   Stress: Patient refused  Relationships   Social connections:    Talks on phone: Patient refused    Gets together: Patient refused    Attends religious service: Patient refused    Active member of club or organization: Patient refused    Attends meetings of clubs or organizations: Patient refused    Relationship status: Patient refused   Intimate partner  violence:    Fear of current or ex partner: Patient refused    Emotionally abused: Patient refused    Physically abused: Patient refused    Forced sexual activity: Patient refused  Other Topics Concern   Not on file  Social History Narrative   Not on file    FAMILY HISTORY: Family History  Problem Relation Age of Onset   Diabetes Father    Hyperlipidemia Father    Heart disease Father    Cancer Father    Hypertension Father    Colon cancer Paternal Grandmother 73   Osteoporosis Mother    Protein S deficiency Mother    Hyperlipidemia Mother    Multiple sclerosis Daughter    Cancer Other        bladder   Breast cancer Neg Hx     ALLERGIES:  is allergic to penicillins; aleve [naproxen sodium]; and sulfonamide derivatives.  MEDICATIONS:  Current Outpatient Medications  Medication Sig Dispense Refill   acyclovir (ZOVIRAX) 400 MG tablet Take 1 tablet (400 mg total) by mouth 2 (two) times daily. 60 tablet 3   albuterol (PROVENTIL HFA;VENTOLIN HFA) 108 (90 Base) MCG/ACT inhaler INHALE 1 TO 2 PUFFS INTO THE LUNGS EVERY 6 HOURS AS NEEDED FOR WHEEZING OR SHORTNESS OF BREATH (Patient not taking: No sig reported) 54 g 1   aspirin EC 81 MG tablet Take 81 mg by mouth daily after breakfast.      Blood Glucose Monitoring Suppl (FREESTYLE FREEDOM LITE) W/DEVICE KIT Use to check blood sugars twice a day Dx 250.00 1 each 0   Calcium Carbonate-Vitamin D (CALCIUM 600+D HIGH POTENCY) 600-400 MG-UNIT per tablet Take 1 tablet by mouth 2 (two) times daily.      Continuous Blood Gluc Sensor (FREESTYLE LIBRE 14 DAY SENSOR) MISC 1 each by Does not apply route every 14 (fourteen) days. 2 each 11   dexamethasone (DECADRON) 4 MG tablet Take 5 tablets (20 mg total) by mouth once a week. On D1,8 and 15 of each cycle of treatment (Patient taking differently: Take 20 mg by mouth See admin instructions. On Day 1,8 and 15 of each cycle of treatment) 20 tablet 5   fentaNYL (DURAGESIC) 12  MCG/HR Place 1 patch onto the skin every 3 (three) days. 10 patch 0  fluticasone (FLONASE) 50 MCG/ACT nasal spray Place 1 spray into both nostrils daily. (Patient taking differently: Place 1 spray into both nostrils daily as needed for allergies or rhinitis. ) 16 g 2   glucose blood (FREESTYLE LITE) test strip CHECK BLOOD SUGAR TWICE DAILY AS DIRECTED Dx 250.00 180 each 3   Lancets (FREESTYLE) lancets Use twice daily to check sugars. 100 each 11   lenalidomide (REVLIMID) 15 MG capsule Take 1 capsule (15 mg total) by mouth daily. Take for 21 days on, 7 days off, repeat every 28 days. 21 capsule 0   lidocaine-prilocaine (EMLA) cream Apply 1 application topically as needed. Use prior to port access. 30 g 0   metFORMIN (GLUCOPHAGE-XR) 500 MG 24 hr tablet Take 3 tablets (1,500 mg total) by mouth daily with breakfast. (Patient taking differently: Take 500-1,000 mg by mouth See admin instructions. 500 mg every morning, 1,000 mg every night) 270 tablet 3   montelukast (SINGULAIR) 10 MG tablet TAKE 1 TABLET BY MOUTH DAILY AS NEEDED (Patient taking differently: Take 10 mg by mouth at bedtime as needed (allergies). ) 30 tablet 3   Multiple Vitamins-Minerals (ICAPS) CAPS Take 1 capsule by mouth daily after breakfast.      ondansetron (ZOFRAN) 8 MG tablet Take 1 tablet (8 mg total) by mouth 2 (two) times daily as needed (Nausea or vomiting). 30 tablet 1   oxyCODONE 10 MG TABS Take 1 tablet (10 mg total) by mouth every 6 (six) hours as needed for moderate pain or severe pain. 90 tablet 0   pantoprazole (PROTONIX) 20 MG tablet TAKE 1 TABLET(20 MG) BY MOUTH DAILY (Patient not taking: No sig reported) 30 tablet 5   polyethylene glycol (MIRALAX / GLYCOLAX) packet Take 17 g by mouth daily after breakfast.      prochlorperazine (COMPAZINE) 10 MG tablet Take 1 tablet (10 mg total) by mouth every 6 (six) hours as needed (Nausea or vomiting). 30 tablet 1   senna-docusate (SENOKOT-S) 8.6-50 MG tablet Take 2  tablets by mouth at bedtime. 60 tablet 1   sertraline (ZOLOFT) 50 MG tablet TAKE 1 TABLET BY MOUTH DAILY (Patient taking differently: Take 50 mg by mouth at bedtime. ) 90 tablet 1   simvastatin (ZOCOR) 20 MG tablet TAKE 1 TABLET(20 MG) BY MOUTH DAILY (Patient taking differently: Take 20 mg by mouth every evening. ) 90 tablet 1   Vitamin D, Ergocalciferol, (DRISDOL) 1.25 MG (50000 UT) CAPS capsule TAKE 1 CAPSULE BY MOUTH EVERY 7 DAYS 12 capsule 0   No current facility-administered medications for this visit.     REVIEW OF SYSTEMS:    A 10+ POINT REVIEW OF SYSTEMS WAS OBTAINED including neurology, dermatology, psychiatry, cardiac, respiratory, lymph, extremities, GI, GU, Musculoskeletal, constitutional, breasts, reproductive, HEENT.  All pertinent positives are noted in the HPI.  All others are negative.   PHYSICAL EXAMINATION: ECOG PERFORMANCE STATUS: 2 - Symptomatic, <50% confined to bed  . Vitals:   03/09/19 1159  BP: (!) 141/77  Pulse: (!) 115  Resp: 18  Temp: 98.4 F (36.9 C)  SpO2: 98%   Filed Weights   03/09/19 1159  Weight: 172 lb 3.2 oz (78.1 kg)   .Body mass index is 33.91 kg/m.  GENERAL:alert, in no acute distress and comfortable SKIN: no acute rashes, no significant lesions EYES: conjunctiva are pink and non-injected, sclera anicteric OROPHARYNX: MMM, no exudates, no oropharyngeal erythema or ulceration NECK: supple, no JVD LYMPH:  no palpable lymphadenopathy in the cervical, axillary or inguinal regions LUNGS: clear  to auscultation b/l with normal respiratory effort HEART: regular rate & rhythm ABDOMEN:  normoactive bowel sounds , non tender, not distended. No palpable hepatosplenomegaly.  Extremity: no pedal edema PSYCH: alert & oriented x 3 with fluent speech NEURO: no focal motor/sensory deficits   LABORATORY DATA:  I have reviewed the data as listed  . CBC Latest Ref Rng & Units 03/09/2019 03/02/2019 02/20/2019  WBC 4.0 - 10.5 K/uL 8.9 6.9 5.2    Hemoglobin 12.0 - 15.0 g/dL 13.6 12.1 12.9  Hematocrit 36.0 - 46.0 % 40.1 36.3 39.7  Platelets 150 - 400 K/uL 168 253 181   . CBC    Component Value Date/Time   WBC 8.9 03/09/2019 1059   RBC 4.30 03/09/2019 1059   HGB 13.6 03/09/2019 1059   HGB 12.8 02/16/2019 1138   HCT 40.1 03/09/2019 1059   PLT 168 03/09/2019 1059   PLT 170 02/16/2019 1138   MCV 93.3 03/09/2019 1059   MCH 31.6 03/09/2019 1059   MCHC 33.9 03/09/2019 1059   RDW 13.1 03/09/2019 1059   LYMPHSABS 1.9 03/09/2019 1059   MONOABS 0.9 03/09/2019 1059   EOSABS 0.3 03/09/2019 1059   BASOSABS 0.0 03/09/2019 1059    . CMP Latest Ref Rng & Units 03/02/2019 02/20/2019 02/16/2019  Glucose 70 - 99 mg/dL 294(H) 196(H) 260(H)  BUN 8 - 23 mg/dL '10 21 17  ' Creatinine 0.44 - 1.00 mg/dL 0.75 0.59 0.63  Sodium 135 - 145 mmol/L 139 137 135  Potassium 3.5 - 5.1 mmol/L 3.7 3.8 4.2  Chloride 98 - 111 mmol/L 105 104 103  CO2 22 - 32 mmol/L '22 23 23  ' Calcium 8.9 - 10.3 mg/dL 9.6 10.6(H) 10.2  Total Protein 6.5 - 8.1 g/dL 6.2(L) - 6.3(L)  Total Bilirubin 0.3 - 1.2 mg/dL 0.7 - 0.6  Alkaline Phos 38 - 126 U/L 107 - 99  AST 15 - 41 U/L 38 - 41  ALT 0 - 44 U/L 46(H) - 47(H)   Component     Latest Ref Rng & Units 12/27/2018  IgG (Immunoglobin G), Serum     700 - 1,600 mg/dL 801  IgA     64 - 422 mg/dL 91  IgM (Immunoglobulin M), Srm     26 - 217 mg/dL 15 (L)  Total Protein ELP     6.0 - 8.5 g/dL 6.6  Albumin SerPl Elph-Mcnc     2.9 - 4.4 g/dL 3.7  Alpha 1     0.0 - 0.4 g/dL 0.2  Alpha2 Glob SerPl Elph-Mcnc     0.4 - 1.0 g/dL 0.8  B-Globulin SerPl Elph-Mcnc     0.7 - 1.3 g/dL 1.2  Gamma Glob SerPl Elph-Mcnc     0.4 - 1.8 g/dL 0.7  M Protein SerPl Elph-Mcnc     Not Observed g/dL 0.5 (H)  Globulin, Total     2.2 - 3.9 g/dL 2.9  Albumin/Glob SerPl     0.7 - 1.7 1.3  IFE 1      Comment  Please Note (HCV):      Comment  Kappa free light chain     3.3 - 19.4 mg/L 5.1  Lamda free light chains     5.7 - 26.3 mg/L 40.3 (H)   Kappa, lamda light chain ratio     0.26 - 1.65 0.13 (L)  Beta-2 Microglobulin     0.6 - 2.4 mg/L 1.7  LDH     98 - 192 U/L 162  Sed Rate  0 - 22 mm/hr 8       01/06/19 Cytogenetics:       RADIOGRAPHIC STUDIES: I have personally reviewed the radiological images as listed and agreed with the findings in the report. Ir Imaging Guided Port Insertion  Result Date: 02/20/2019 INDICATION: 78 year old female with multiple myeloma in need of durable venous access for chemotherapy. EXAM: IMPLANTED PORT A CATH PLACEMENT WITH ULTRASOUND AND FLUOROSCOPIC GUIDANCE MEDICATIONS: 2 g Ancef; The antibiotic was administered within an appropriate time interval prior to skin puncture. ANESTHESIA/SEDATION: Versed 2 mg IV; Fentanyl 100 mcg IV; Moderate Sedation Time:  24 minutes The patient was continuously monitored during the procedure by the interventional radiology nurse under my direct supervision. FLUOROSCOPY TIME:  0 minutes, 12 seconds (3 mGy) COMPLICATIONS: None immediate. PROCEDURE: The right neck and chest was prepped with chlorhexidine, and draped in the usual sterile fashion using maximum barrier technique (cap and mask, sterile gown, sterile gloves, large sterile sheet, hand hygiene and cutaneous antiseptic). Local anesthesia was attained by infiltration with 1% lidocaine with epinephrine. Ultrasound demonstrated patency of the right internal jugular vein, and this was documented with an image. Under real-time ultrasound guidance, this vein was accessed with a 21 gauge micropuncture needle and image documentation was performed. A small dermatotomy was made at the access site with an 11 scalpel. A 0.018" wire was advanced into the SVC and the access needle exchanged for a 88F micropuncture vascular sheath. The 0.018" wire was then removed and a 0.035" wire advanced into the IVC. An appropriate location for the subcutaneous reservoir was selected below the clavicle and an incision was made through the  skin and underlying soft tissues. The subcutaneous tissues were then dissected using a combination of blunt and sharp surgical technique and a pocket was formed. A single lumen power injectable portacatheter was then tunneled through the subcutaneous tissues from the pocket to the dermatotomy and the port reservoir placed within the subcutaneous pocket. The venous access site was then serially dilated and a peel away vascular sheath placed over the wire. The wire was removed and the port catheter advanced into position under fluoroscopic guidance. The catheter tip is positioned in the superior cavoatrial junction. This was documented with a spot image. The portacatheter was then tested and found to flush and aspirate well. The port was flushed with saline followed by 100 units/mL heparinized saline. The pocket was then closed in two layers using first subdermal inverted interrupted absorbable sutures followed by a running subcuticular suture. The epidermis was then sealed with Dermabond. The dermatotomy at the venous access site was also closed with Dermabond. IMPRESSION: Successful placement of a right IJ approach Power Port with ultrasound and fluoroscopic guidance. The catheter is ready for use. Electronically Signed   By: Jacqulynn Cadet M.D.   On: 02/20/2019 15:11    ASSESSMENT & PLAN:   78 y.o. female with  1. Newly diagnosed Multiple Myeloma, RISS Stage III  Labs upon initial presentation from 12/08/18, blood counts are normal including WBC at 7.1k, HGB at 13.1, and PLT at 245k. Calcium normal at 10.3. Creatinine normal at 0.63. M spike at 0.5g. 12/13/18 Bone Scan revealed Multifocal uptake throughout the skeleton, consistent with diffuse metastatic disease. Primary tumor is not specified. 2. Uptake in the proximal right femur, consistent with lytic lesions. 3. Uptake in the ribs bilaterally as described. 4. Lesions in the proximal left humerus. 5. Diffuse uptake throughout the skull consistent  with metastatic disease. 6. Right paramedian uptake at the manubrium.  12/13/18 CT Right Femur revealed Numerous lytic lesions involving the right femur and a lytic lesion in the left inferior pubic ramus. Overall appearance is most concerning for multiple myeloma  12/27/18 Pretreatment 24hour UPEP observed an M spike at 74m, and showed 1928mtotal protein/day.  12/27/18 Pretreatment MMP revealed M Protein at 0.5g with IgG Lambda specificity. Kappa:Lambda light chain ratio at 0.13, with Lambda at 40.3. There is less abnormal protein and light chains than I would expect from 30% plasma cells, which suggests hypo-secretory or non-secretory neoplastic plasma cells. Will have an impact in assessing response. 01/05/19 PET/CT revealed Innumerable lytic lesions in the skeleton compatible with myeloma. Most of the larger lesions are hypermetabolic, for example including a left proximal humeral shaft lesion with maximum SUV of 8.1 and a 2.8 cm lesion in the left T9 vertebral body with maximum SUV 5.1. Most of the smaller lytic lesions, and some of the larger lesions, do not demonstrate accentuated metabolic activity. 2. 1.2 cm in short axis lymph node in the left parapharyngeal space is hypermetabolic with maximum SUV 11.8. I do not see a separate mass in the head and neck to give rise to this hypermetabolic lymph node. 3. Mosaic attenuation in the lower lobes, nonspecific possibly from air trapping. 4.  Aortic Atherosclerosis 5. Heterogeneous activity in the liver, making it hard to exclude small liver lesions. Consider hepatic protocol MRI with and without contrast for definitive assessment. Nonobstructive right nephrolithiasis. Old granulomatous disease  01/06/19 Bone Marrow biopsy revealed interstitial increase in plasma cells (28% aspirate, 40% CD138 immunohistochemistry). Plasma cells negative for light chains consistent with a non or weakly secretory myeloma   01/06/19 Cytogenetics revealed 37% of cells with  trisomy 11 or 11q deletion, and 40.5% of cells with 17p mutation  2. 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  01/17/19 MRI Liver revealed Several appreciable liver lesions all have benign imaging characteristics. No MRI findings of metastatic involvement of the liver. 2. Scattered bony lesions corresponding to the lytic lesions seen at PET-CT, compatible with active myeloma. 3. Aortic Atherosclerosis.  Mild cardiomegaly. 4. Diffuse hepatic steatosis.   PLAN -Discussed pt labwork today, 03/09/19; anemia has resolved, HGB up to 13.6 now.  -Last available M spike from 03/02/19 was at 0.2g. Last available SFLC from 03/02/19 normalized, including a K:L ratio at 0.48 -The pt has no prohibitive toxicities from continuing C2D8 Carfilzomib, Revlimid, and Dexamethasone at this time. -Discussed again that the pt appears to have a hypo-secretory presentation, and thus these labs results are indicative of a response, but the most accurate measure of her response will require a repeat BM Biopsy after completing 4-6 cycles -Begin 1013mlaritin or Zyrtec and 8m25mpcid for mild rashes, will continue to watch these -Will treat until maximum response, or plateau response -Adjusted premedications to add Tylenol and will split dose of Dexamethasone with pre-meds and pt will not take Decadron at home except on D22 -Okay to take Tylenol at home if feeling flushed, however pt will seek medical attention if fevers rise above 100.4 or repeat in frequency -Will watch lymph node in left parapharyngeal space seen on PET/CT -Discussed that I cannot determine when the pt's risk of pathologic fracture reduces enough to displace the necessity of prophylactic fixation, and that she should continue to follow up with Dr. EmorMylo Red further evaluation and consideration -Follow up [facility]  consideration of possible prophylactic interventions to mitigate risk of pathologic fractures, on April 8, will hold treatment if surgery is decided upon -Avoid lifting, pulling, pushing with left upper extremity  -Avoid high impact activities and stairs to preserve right hip as well -May be a role for local radiation therapy for pain control at T9 and left shoulder -Continue Fentanyl patch and 51m Oxycodone for break through pain -Continue Zofran for opiate-related nausea -Continue Miralax once a day, and Senna S two tablets BID, back off as constipation resolves -Continue Zometa -Continue Acyclovir and 86mAspirin to mitigate risk of Shingles and blood clots respectively -Continue follow up with PCP Dr. ElPricilla Holmor management and evaluation of her DM, especially while on treatment with steroids as blood sugars will be higher -Will see the pt back in 4 weeks   Please schedule C3 of treatment as ordered. Start date to be determined by patients surgery plan. RTC with Dr KaIrene Limbon 4 weeks   All of the patients questions were answered with apparent satisfaction. The patient knows to call the clinic with any problems, questions or concerns.  The total time spent in the appt was 35 minutes and more than 50% was on counseling and direct patient cares.    GaSullivan LoneD MS AAHIVMS SCNew England Surgery Center LLCTMinnetonka Ambulatory Surgery Center LLCematology/Oncology Physician CoSurgicare Center Inc(Office):       [phone] cell):  [phone]):           [phone]/26/2020 12:33 PM  I, ScBaldwin Jamaicaam acting as a scribe for Dr. GaSullivan Lone  .I have reviewed the above documentation for accuracy and completeness, and I agree with the above. .GBrunetta GeneraD

## 2019-03-09 ENCOUNTER — Other Ambulatory Visit: Payer: Self-pay

## 2019-03-09 ENCOUNTER — Other Ambulatory Visit: Payer: Self-pay | Admitting: Medical

## 2019-03-09 ENCOUNTER — Telehealth: Payer: Self-pay | Admitting: Hematology

## 2019-03-09 ENCOUNTER — Encounter: Payer: Self-pay | Admitting: Internal Medicine

## 2019-03-09 ENCOUNTER — Inpatient Hospital Stay: Payer: Medicare Other

## 2019-03-09 ENCOUNTER — Inpatient Hospital Stay: Payer: Medicare Other | Admitting: Hematology

## 2019-03-09 ENCOUNTER — Other Ambulatory Visit: Payer: Self-pay | Admitting: Hematology

## 2019-03-09 ENCOUNTER — Inpatient Hospital Stay (HOSPITAL_BASED_OUTPATIENT_CLINIC_OR_DEPARTMENT_OTHER): Payer: Medicare Other | Admitting: Medical

## 2019-03-09 ENCOUNTER — Other Ambulatory Visit: Payer: Medicare Other

## 2019-03-09 VITALS — HR 103

## 2019-03-09 VITALS — BP 141/77 | HR 115 | Temp 98.4°F | Resp 18 | Ht 59.75 in | Wt 172.2 lb

## 2019-03-09 DIAGNOSIS — L509 Urticaria, unspecified: Secondary | ICD-10-CM

## 2019-03-09 DIAGNOSIS — L299 Pruritus, unspecified: Secondary | ICD-10-CM

## 2019-03-09 DIAGNOSIS — Z7189 Other specified counseling: Secondary | ICD-10-CM

## 2019-03-09 DIAGNOSIS — K5900 Constipation, unspecified: Secondary | ICD-10-CM | POA: Diagnosis not present

## 2019-03-09 DIAGNOSIS — C9000 Multiple myeloma not having achieved remission: Secondary | ICD-10-CM

## 2019-03-09 DIAGNOSIS — R9389 Abnormal findings on diagnostic imaging of other specified body structures: Secondary | ICD-10-CM

## 2019-03-09 DIAGNOSIS — C7951 Secondary malignant neoplasm of bone: Secondary | ICD-10-CM

## 2019-03-09 DIAGNOSIS — Z5112 Encounter for antineoplastic immunotherapy: Secondary | ICD-10-CM | POA: Diagnosis not present

## 2019-03-09 DIAGNOSIS — Z5111 Encounter for antineoplastic chemotherapy: Secondary | ICD-10-CM

## 2019-03-09 DIAGNOSIS — R739 Hyperglycemia, unspecified: Secondary | ICD-10-CM

## 2019-03-09 LAB — CMP (CANCER CENTER ONLY)
ALT: 49 U/L — ABNORMAL HIGH (ref 0–44)
AST: 33 U/L (ref 15–41)
Albumin: 3.5 g/dL (ref 3.5–5.0)
Alkaline Phosphatase: 100 U/L (ref 38–126)
Anion gap: 16 — ABNORMAL HIGH (ref 5–15)
BUN: 16 mg/dL (ref 8–23)
CO2: 16 mmol/L — ABNORMAL LOW (ref 22–32)
Calcium: 10.1 mg/dL (ref 8.9–10.3)
Chloride: 102 mmol/L (ref 98–111)
Creatinine: 0.8 mg/dL (ref 0.44–1.00)
GFR, Est AFR Am: >60 mL/min (ref >60)
GFR, Estimated: >60 mL/min (ref >60)
Glucose, Bld: 361 mg/dL — ABNORMAL HIGH (ref 70–99)
POTASSIUM: 3.8 mmol/L (ref 3.5–5.1)
Sodium: 134 mmol/L — ABNORMAL LOW (ref 135–145)
Total Bilirubin: 0.7 mg/dL (ref 0.3–1.2)
Total Protein: 6.7 g/dL (ref 6.5–8.1)

## 2019-03-09 LAB — CBC WITH DIFFERENTIAL/PLATELET
ABS IMMATURE GRANULOCYTES: 0.24 10*3/uL — AB (ref 0.00–0.07)
BASOS PCT: 0 %
Basophils Absolute: 0 10*3/uL (ref 0.0–0.1)
Eosinophils Absolute: 0.3 10*3/uL (ref 0.0–0.5)
Eosinophils Relative: 4 %
HCT: 40.1 % (ref 36.0–46.0)
Hemoglobin: 13.6 g/dL (ref 12.0–15.0)
Immature Granulocytes: 3 %
Lymphocytes Relative: 21 %
Lymphs Abs: 1.9 10*3/uL (ref 0.7–4.0)
MCH: 31.6 pg (ref 26.0–34.0)
MCHC: 33.9 g/dL (ref 30.0–36.0)
MCV: 93.3 fL (ref 80.0–100.0)
Monocytes Absolute: 0.9 10*3/uL (ref 0.1–1.0)
Monocytes Relative: 10 %
Neutro Abs: 5.5 10*3/uL (ref 1.7–7.7)
Neutrophils Relative %: 62 %
Platelets: 168 10*3/uL (ref 150–400)
RBC: 4.3 MIL/uL (ref 3.87–5.11)
RDW: 13.1 % (ref 11.5–15.5)
WBC: 8.9 10*3/uL (ref 4.0–10.5)
nRBC: 0 % (ref 0.0–0.2)

## 2019-03-09 MED ORDER — INSULIN REGULAR HUMAN 100 UNIT/ML IJ SOLN
4.0000 [IU] | Freq: Once | INTRAMUSCULAR | Status: AC
  Administered 2019-03-09: 4 [IU] via SUBCUTANEOUS
  Filled 2019-03-09: qty 10

## 2019-03-09 MED ORDER — SODIUM CHLORIDE 0.9% FLUSH
10.0000 mL | Freq: Once | INTRAVENOUS | Status: AC | PRN
  Administered 2019-03-09: 10 mL
  Filled 2019-03-09: qty 10

## 2019-03-09 MED ORDER — SODIUM CHLORIDE 0.9 % IV SOLN
Freq: Once | INTRAVENOUS | Status: AC
  Administered 2019-03-09: 13:00:00 via INTRAVENOUS
  Filled 2019-03-09: qty 250

## 2019-03-09 MED ORDER — DIPHENHYDRAMINE HCL 50 MG/ML IJ SOLN
INTRAMUSCULAR | Status: AC
  Filled 2019-03-09: qty 1

## 2019-03-09 MED ORDER — DEXAMETHASONE 4 MG PO TABS: 20.0000 mg | ORAL_TABLET | ORAL | 5 refills | Status: AC

## 2019-03-09 MED ORDER — ACETAMINOPHEN 325 MG PO TABS
650.0000 mg | ORAL_TABLET | Freq: Once | ORAL | Status: AC
  Administered 2019-03-09: 650 mg via ORAL

## 2019-03-09 MED ORDER — SODIUM CHLORIDE 0.9% FLUSH
10.0000 mL | INTRAVENOUS | Status: DC | PRN
  Administered 2019-03-09: 10 mL
  Filled 2019-03-09: qty 10

## 2019-03-09 MED ORDER — PROCHLORPERAZINE MALEATE 10 MG PO TABS
ORAL_TABLET | ORAL | Status: AC
  Filled 2019-03-09: qty 1

## 2019-03-09 MED ORDER — DIPHENHYDRAMINE HCL 50 MG/ML IJ SOLN
25.0000 mg | Freq: Once | INTRAMUSCULAR | Status: AC
  Administered 2019-03-09: 25 mg via INTRAVENOUS

## 2019-03-09 MED ORDER — SODIUM CHLORIDE 0.9 % IV SOLN
Freq: Once | INTRAVENOUS | Status: DC
  Filled 2019-03-09: qty 250

## 2019-03-09 MED ORDER — DEXAMETHASONE 4 MG PO TABS
20.0000 mg | ORAL_TABLET | Freq: Once | ORAL | Status: DC
  Administered 2019-03-09: 20 mg via ORAL

## 2019-03-09 MED ORDER — HEPARIN SOD (PORK) LOCK FLUSH 100 UNIT/ML IV SOLN
500.0000 [IU] | Freq: Once | INTRAVENOUS | Status: AC | PRN
  Administered 2019-03-09: 500 [IU]
  Filled 2019-03-09: qty 5

## 2019-03-09 MED ORDER — DEXTROSE 5 % IV SOLN
27.0000 mg/m2 | Freq: Once | INTRAVENOUS | Status: AC
  Administered 2019-03-09: 50 mg via INTRAVENOUS
  Filled 2019-03-09: qty 15

## 2019-03-09 MED ORDER — ACETAMINOPHEN 325 MG PO TABS
ORAL_TABLET | ORAL | Status: AC
  Filled 2019-03-09: qty 2

## 2019-03-09 MED ORDER — CETIRIZINE HCL 10 MG PO CAPS: 10.0000 mg | ORAL_CAPSULE | Freq: Every day | ORAL | 1 refills | Status: DC

## 2019-03-09 MED ORDER — FEXOFENADINE HCL 60 MG PO TABS: 60.0000 mg | ORAL_TABLET | Freq: Two times a day (BID) | ORAL | 1 refills | Status: DC

## 2019-03-09 MED ORDER — PROCHLORPERAZINE MALEATE 10 MG PO TABS
10.0000 mg | ORAL_TABLET | Freq: Once | ORAL | Status: AC
  Administered 2019-03-09: 10 mg via ORAL

## 2019-03-09 MED ORDER — DEXAMETHASONE 4 MG PO TABS
ORAL_TABLET | ORAL | Status: AC
  Filled 2019-03-09: qty 5

## 2019-03-09 MED ORDER — DIPHENHYDRAMINE HCL 50 MG/ML IJ SOLN: 25.0000 mg | Freq: Once | INTRAMUSCULAR | Status: DC

## 2019-03-09 MED ORDER — TRIAMCINOLONE ACETONIDE 0.025 % EX LOTN: 1.0000 "application " | TOPICAL_LOTION | Freq: Three times a day (TID) | CUTANEOUS | 2 refills | Status: DC | PRN

## 2019-03-09 NOTE — Progress Notes (Signed)
03/09/19  Received order for Regular Insulin 4 units x 1 and have patient recheck BS in 2 hours at home.  Have patient follow up with PCP.     T.O. Dr Cleda Clarks, PharmD

## 2019-03-09 NOTE — Patient Instructions (Signed)
Begin taking 10mg  Claritin (or Zyrtec) daily. Begin taking 20mg  Pepcid daily.

## 2019-03-09 NOTE — Progress Notes (Signed)
Per Dr. Irene Limbo, okay to receive Kyprolis treatment today with heart rate of 103.

## 2019-03-09 NOTE — Telephone Encounter (Signed)
Scheduled appt per 3/26 los.

## 2019-03-09 NOTE — Patient Instructions (Signed)
Per Dr. Irene Limbo, begin taking Claritin or Zyrtec AND Pepcid daily. Continue Revlimid if rash is manageable with these new medications. If rash worsens, please stop Revlimid and call office.   [facility] Discharge Instructions for Patients Receiving Chemotherapy  Today you received the following chemotherapy agents:  Kyprolis.  To help prevent nausea and vomiting after your treatment, we encourage you to take your nausea medication as directed.   If you develop nausea and vomiting that is not controlled by your nausea medication, call the clinic.   BELOW ARE SYMPTOMS THAT SHOULD BE REPORTED IMMEDIATELY:  *FEVER GREATER THAN 100.5 F  *CHILLS WITH OR WITHOUT FEVER  NAUSEA AND VOMITING THAT IS NOT CONTROLLED WITH YOUR NAUSEA MEDICATION  *UNUSUAL SHORTNESS OF BREATH  *UNUSUAL BRUISING OR BLEEDING  TENDERNESS IN MOUTH AND THROAT WITH OR WITHOUT PRESENCE OF ULCERS  *URINARY PROBLEMS  *BOWEL PROBLEMS  UNUSUAL RASH Items with * indicate a potential emergency and should be followed up as soon as possible.  Feel free to call the clinic should you have any questions or concerns. The clinic phone number is [phone].  Please show the [facility] at check-in to the Emergency Department and triage nurse.

## 2019-03-09 NOTE — Progress Notes (Signed)
Patient presented to infusion room with c/o rash this morning. States it resolved without intervention prior to arriving. Ms. Guarisco mentioned this to Dr. Irene Limbo during office visit. He gave her instructions at the time of her OV for home care. Before Kyprolis initiated, patient began c/o "feeling itchy, again." On exam, noted large urticaria to abdomen and bilateral axillae. Sandi Mealy, PA-C came to see patient in infusion room and diphenhydramine ordered and given as documented in [facility]. Urticaria improved significantly prior to discharge.

## 2019-03-10 ENCOUNTER — Inpatient Hospital Stay: Payer: Medicare Other

## 2019-03-10 ENCOUNTER — Other Ambulatory Visit: Payer: Self-pay

## 2019-03-10 VITALS — BP 130/75 | HR 102 | Temp 98.4°F | Resp 17

## 2019-03-10 DIAGNOSIS — C9000 Multiple myeloma not having achieved remission: Secondary | ICD-10-CM

## 2019-03-10 DIAGNOSIS — Z5112 Encounter for antineoplastic immunotherapy: Secondary | ICD-10-CM | POA: Diagnosis not present

## 2019-03-10 DIAGNOSIS — Z7189 Other specified counseling: Secondary | ICD-10-CM

## 2019-03-10 MED ORDER — HEPARIN SOD (PORK) LOCK FLUSH 100 UNIT/ML IV SOLN
500.0000 [IU] | Freq: Once | INTRAVENOUS | Status: AC | PRN
  Administered 2019-03-10: 500 [IU]
  Filled 2019-03-10: qty 5

## 2019-03-10 MED ORDER — DEXAMETHASONE 4 MG PO TABS
ORAL_TABLET | ORAL | Status: AC
  Filled 2019-03-10: qty 5

## 2019-03-10 MED ORDER — DEXTROSE 5 % IV SOLN
27.0000 mg/m2 | Freq: Once | INTRAVENOUS | Status: AC
  Administered 2019-03-10: 50 mg via INTRAVENOUS
  Filled 2019-03-10: qty 15

## 2019-03-10 MED ORDER — SODIUM CHLORIDE 0.9% FLUSH
10.0000 mL | INTRAVENOUS | Status: DC | PRN
  Administered 2019-03-10: 10 mL
  Filled 2019-03-10: qty 10

## 2019-03-10 MED ORDER — PROCHLORPERAZINE MALEATE 10 MG PO TABS
10.0000 mg | ORAL_TABLET | Freq: Once | ORAL | Status: AC
  Administered 2019-03-10: 10 mg via ORAL

## 2019-03-10 MED ORDER — ACETAMINOPHEN 325 MG PO TABS
ORAL_TABLET | ORAL | Status: AC
  Filled 2019-03-10: qty 2

## 2019-03-10 MED ORDER — PROCHLORPERAZINE MALEATE 10 MG PO TABS
ORAL_TABLET | ORAL | Status: AC
  Filled 2019-03-10: qty 1

## 2019-03-10 MED ORDER — SODIUM CHLORIDE 0.9 % IV SOLN
Freq: Once | INTRAVENOUS | Status: AC
  Administered 2019-03-10: 13:00:00 via INTRAVENOUS
  Filled 2019-03-10: qty 250

## 2019-03-10 MED ORDER — SODIUM CHLORIDE 0.9 % IV SOLN
Freq: Once | INTRAVENOUS | Status: DC
  Filled 2019-03-10: qty 250

## 2019-03-10 MED ORDER — DEXAMETHASONE 4 MG PO TABS
20.0000 mg | ORAL_TABLET | Freq: Once | ORAL | Status: AC
  Administered 2019-03-10: 20 mg via ORAL

## 2019-03-10 MED ORDER — ACETAMINOPHEN 325 MG PO TABS
650.0000 mg | ORAL_TABLET | Freq: Once | ORAL | Status: AC
  Administered 2019-03-10: 650 mg via ORAL

## 2019-03-10 NOTE — Patient Instructions (Signed)
[facility] Discharge Instructions for Patients Receiving Chemotherapy  Today you received the following chemotherapy agents:  Kyprolis  To help prevent nausea and vomiting after your treatment, we encourage you to take your nausea medication as directed.   If you develop nausea and vomiting that is not controlled by your nausea medication, call the clinic.   BELOW ARE SYMPTOMS THAT SHOULD BE REPORTED IMMEDIATELY:  *FEVER GREATER THAN 100.5 F  *CHILLS WITH OR WITHOUT FEVER  NAUSEA AND VOMITING THAT IS NOT CONTROLLED WITH YOUR NAUSEA MEDICATION  *UNUSUAL SHORTNESS OF BREATH  *UNUSUAL BRUISING OR BLEEDING  TENDERNESS IN MOUTH AND THROAT WITH OR WITHOUT PRESENCE OF ULCERS  *URINARY PROBLEMS  *BOWEL PROBLEMS  UNUSUAL RASH Items with * indicate a potential emergency and should be followed up as soon as possible.  Feel free to call the clinic should you have any questions or concerns. The clinic phone number is [phone].  Please show the CHEMO ALERT CARD at check-in to the Emergency Department and triage nurse.   

## 2019-03-10 NOTE — Progress Notes (Signed)
The patient was seen in the infusion room today after she had been seen earlier by Dr. Sullivan Lone.  She reported that she had a pruritic rash over her right antecubital fossa and along her waist.  She was seen and was noted to have urticaria.  She had received dexamethasone 20 mg IV and Compazine as a premed.  She was given the following:  Benadryl 25 mg IV x1 Allegra 60 mg p.o. twice daily Zyrtec 10 mg once daily Triamcinolone lotion 0.025% to be applied 3 times daily as needed.  This was discussed with Dr. Jenell Milliner who reports that he may need to change the patient's premedications.  Sandi Mealy, MHS, PA-C Physician Assistant

## 2019-03-10 NOTE — Progress Notes (Signed)
Okay to treat with pulse of 102 per Dr. Irene Limbo

## 2019-03-11 ENCOUNTER — Encounter: Payer: Self-pay | Admitting: Hematology

## 2019-03-14 ENCOUNTER — Other Ambulatory Visit: Payer: Self-pay | Admitting: Hematology

## 2019-03-15 ENCOUNTER — Other Ambulatory Visit: Payer: Self-pay | Admitting: Hematology

## 2019-03-15 MED ORDER — OXYCODONE HCL 10 MG PO TABS: 10.0000 mg | ORAL_TABLET | Freq: Four times a day (QID) | ORAL | 0 refills | Status: DC | PRN

## 2019-03-16 ENCOUNTER — Inpatient Hospital Stay: Payer: Medicare Other

## 2019-03-16 ENCOUNTER — Other Ambulatory Visit: Payer: Self-pay

## 2019-03-16 ENCOUNTER — Inpatient Hospital Stay: Payer: Medicare Other | Attending: Hematology

## 2019-03-16 VITALS — BP 150/70 | HR 96 | Temp 98.9°F | Resp 18

## 2019-03-16 DIAGNOSIS — C9000 Multiple myeloma not having achieved remission: Secondary | ICD-10-CM

## 2019-03-16 DIAGNOSIS — Z7189 Other specified counseling: Secondary | ICD-10-CM

## 2019-03-16 DIAGNOSIS — Z5112 Encounter for antineoplastic immunotherapy: Secondary | ICD-10-CM | POA: Diagnosis not present

## 2019-03-16 LAB — CMP (CANCER CENTER ONLY)
ALT: 72 U/L — ABNORMAL HIGH (ref 0–44)
AST: 40 U/L (ref 15–41)
Albumin: 3.1 g/dL — ABNORMAL LOW (ref 3.5–5.0)
Alkaline Phosphatase: 105 U/L (ref 38–126)
Anion gap: 12 (ref 5–15)
BUN: 12 mg/dL (ref 8–23)
CO2: 21 mmol/L — ABNORMAL LOW (ref 22–32)
Calcium: 9.3 mg/dL (ref 8.9–10.3)
Chloride: 103 mmol/L (ref 98–111)
Creatinine: 0.72 mg/dL (ref 0.44–1.00)
GFR, Est AFR Am: >60 mL/min (ref >60)
GFR, Estimated: >60 mL/min (ref >60)
Glucose, Bld: 312 mg/dL — ABNORMAL HIGH (ref 70–99)
Potassium: 3.7 mmol/L (ref 3.5–5.1)
Sodium: 136 mmol/L (ref 135–145)
Total Bilirubin: 0.9 mg/dL (ref 0.3–1.2)
Total Protein: 6 g/dL — ABNORMAL LOW (ref 6.5–8.1)

## 2019-03-16 LAB — CBC WITH DIFFERENTIAL/PLATELET
Abs Immature Granulocytes: 0.27 10*3/uL — ABNORMAL HIGH (ref 0.00–0.07)
Basophils Absolute: 0.1 10*3/uL (ref 0.0–0.1)
Basophils Relative: 1 %
Eosinophils Absolute: 0.5 10*3/uL (ref 0.0–0.5)
Eosinophils Relative: 5 %
HCT: 36.9 % (ref 36.0–46.0)
Hemoglobin: 12.7 g/dL (ref 12.0–15.0)
Immature Granulocytes: 3 %
Lymphocytes Relative: 21 %
Lymphs Abs: 2.2 10*3/uL (ref 0.7–4.0)
MCH: 31.5 pg (ref 26.0–34.0)
MCHC: 34.4 g/dL (ref 30.0–36.0)
MCV: 91.6 fL (ref 80.0–100.0)
Monocytes Absolute: 1.6 10*3/uL — ABNORMAL HIGH (ref 0.1–1.0)
Monocytes Relative: 15 %
Neutro Abs: 5.7 10*3/uL (ref 1.7–7.7)
Neutrophils Relative %: 55 %
Platelets: 174 10*3/uL (ref 150–400)
RBC: 4.03 MIL/uL (ref 3.87–5.11)
RDW: 12.9 % (ref 11.5–15.5)
WBC: 10.3 10*3/uL (ref 4.0–10.5)
nRBC: 0 % (ref 0.0–0.2)

## 2019-03-16 MED ORDER — HEPARIN SOD (PORK) LOCK FLUSH 100 UNIT/ML IV SOLN
500.0000 [IU] | Freq: Once | INTRAVENOUS | Status: AC | PRN
  Administered 2019-03-16: 15:00:00 500 [IU]
  Filled 2019-03-16: qty 5

## 2019-03-16 MED ORDER — ACETAMINOPHEN 325 MG PO TABS
ORAL_TABLET | ORAL | Status: AC
  Filled 2019-03-16: qty 2

## 2019-03-16 MED ORDER — PROCHLORPERAZINE MALEATE 10 MG PO TABS
10.0000 mg | ORAL_TABLET | Freq: Once | ORAL | Status: AC
  Administered 2019-03-16: 14:00:00 10 mg via ORAL

## 2019-03-16 MED ORDER — ACETAMINOPHEN 325 MG PO TABS
650.0000 mg | ORAL_TABLET | Freq: Once | ORAL | Status: AC
  Administered 2019-03-16: 650 mg via ORAL

## 2019-03-16 MED ORDER — DEXAMETHASONE 4 MG PO TABS
20.0000 mg | ORAL_TABLET | Freq: Once | ORAL | Status: AC
  Administered 2019-03-16: 20 mg via ORAL

## 2019-03-16 MED ORDER — FAMOTIDINE 20 MG PO TABS
ORAL_TABLET | ORAL | Status: AC
  Filled 2019-03-16: qty 1

## 2019-03-16 MED ORDER — SODIUM CHLORIDE 0.9 % IV SOLN
Freq: Once | INTRAVENOUS | Status: AC
  Administered 2019-03-16: 13:00:00 via INTRAVENOUS
  Filled 2019-03-16: qty 250

## 2019-03-16 MED ORDER — DEXAMETHASONE 4 MG PO TABS
ORAL_TABLET | ORAL | Status: AC
  Filled 2019-03-16: qty 5

## 2019-03-16 MED ORDER — FAMOTIDINE 20 MG PO TABS
20.0000 mg | ORAL_TABLET | Freq: Once | ORAL | Status: AC
  Administered 2019-03-16: 20 mg via ORAL

## 2019-03-16 MED ORDER — DEXTROSE 5 % IV SOLN
27.0000 mg/m2 | Freq: Once | INTRAVENOUS | Status: AC
  Administered 2019-03-16: 50 mg via INTRAVENOUS
  Filled 2019-03-16: qty 10

## 2019-03-16 MED ORDER — SODIUM CHLORIDE 0.9% FLUSH
10.0000 mL | INTRAVENOUS | Status: DC | PRN
  Administered 2019-03-16: 15:00:00 10 mL
  Filled 2019-03-16: qty 10

## 2019-03-16 MED ORDER — DIPHENHYDRAMINE HCL 25 MG PO TABS
25.0000 mg | ORAL_TABLET | Freq: Once | ORAL | Status: AC
  Administered 2019-03-16: 25 mg via ORAL
  Filled 2019-03-16: qty 1

## 2019-03-16 MED ORDER — DIPHENHYDRAMINE HCL 25 MG PO CAPS
ORAL_CAPSULE | ORAL | Status: AC
  Filled 2019-03-16: qty 1

## 2019-03-16 MED ORDER — SODIUM CHLORIDE 0.9% FLUSH
3.0000 mL | Freq: Once | INTRAVENOUS | Status: AC | PRN
  Administered 2019-03-16: 3 mL
  Filled 2019-03-16: qty 10

## 2019-03-16 MED ORDER — PROCHLORPERAZINE MALEATE 10 MG PO TABS
ORAL_TABLET | ORAL | Status: AC
  Filled 2019-03-16: qty 1

## 2019-03-16 NOTE — Patient Instructions (Signed)
Coronavirus (COVID-19) Are you at risk?  Are you at risk for the Coronavirus (COVID-19)?  To be considered HIGH RISK for Coronavirus (COVID-19), you have to meet the following criteria:  . Traveled to [place], [place], [place], [place] or [place]; or in the [place] to [place], [place], [place], or [place]; and have fever, cough, and shortness of breath within the last 2 weeks of travel OR . Been in close contact with a person diagnosed with COVID-19 within the last 2 weeks and have fever, cough, and shortness of breath . IF YOU DO NOT MEET THESE CRITERIA, YOU ARE CONSIDERED LOW RISK FOR COVID-19.  What to do if you are HIGH RISK for COVID-19?  Marland Kitchen If you are having a medical emergency, call 911. . Seek medical care right away. Before you go to a doctor's office, urgent care or emergency department, call ahead and tell them about your recent travel, contact with someone diagnosed with COVID-19, and your symptoms. You should receive instructions from your physician's office regarding next steps of care.  . When you arrive at healthcare provider, tell the healthcare staff immediately you have returned from visiting [place], [place], [place], [place] or [place]; or traveled in the [place] to [place], [place], [place], or [place]; in the last two weeks or you have been in close contact with a person diagnosed with COVID-19 in the last 2 weeks.   . Tell the health care staff about your symptoms: fever, cough and shortness of breath. . After you have been seen by a medical provider, you will be either: o Tested for (COVID-19) and discharged home on quarantine except to seek medical care if symptoms worsen, and asked to  - Stay home and avoid contact with others until you get your results (4-5 days)  - Avoid travel on public transportation if possible (such as bus, train, or airplane) or o Sent to the Emergency Department by EMS for evaluation, COVID-19 testing, and possible  admission depending on your condition and test results.  What to do if you are LOW RISK for COVID-19?  Reduce your risk of any infection by using the same precautions used for avoiding the common cold or flu:  Marland Kitchen Wash your hands often with soap and warm water for at least 20 seconds.  If soap and water are not readily available, use an alcohol-based hand sanitizer with at least 60% alcohol.  . If coughing or sneezing, cover your mouth and nose by coughing or sneezing into the elbow areas of your shirt or coat, into a tissue or into your sleeve (not your hands). . Avoid shaking hands with others and consider head nods or verbal greetings only. . Avoid touching your eyes, nose, or mouth with unwashed hands.  . Avoid close contact with people who are sick. . Avoid places or events with large numbers of people in one location, like concerts or sporting events. . Carefully consider travel plans you have or are making. . If you are planning any travel outside or inside the [name], visit the CDC's Travelers' Health webpage for the latest health notices. . If you have some symptoms but not all symptoms, continue to monitor at home and seek medical attention if your symptoms worsen. . If you are having a medical emergency, call 911.   ADDITIONAL HEALTHCARE OPTIONS FOR PATIENTS  [facility] / e-Visit: [email or web address]         [facility]: [phone]  [facility]  [facility]: [phone]                   [facility]: [place] Discharge Instructions for Patients Receiving Chemotherapy  Today you received the following chemotherapy agents: Kyprolis  To help prevent nausea and vomiting after your treatment, we encourage you to take your nausea medication as directed.   If you develop nausea and vomiting that is not controlled by your nausea medication, call the clinic.   BELOW  ARE SYMPTOMS THAT SHOULD BE REPORTED IMMEDIATELY:  *FEVER GREATER THAN 100.5 F  *CHILLS WITH OR WITHOUT FEVER  NAUSEA AND VOMITING THAT IS NOT CONTROLLED WITH YOUR NAUSEA MEDICATION  *UNUSUAL SHORTNESS OF BREATH  *UNUSUAL BRUISING OR BLEEDING  TENDERNESS IN MOUTH AND THROAT WITH OR WITHOUT PRESENCE OF ULCERS  *URINARY PROBLEMS  *BOWEL PROBLEMS  UNUSUAL RASH Items with * indicate a potential emergency and should be followed up as soon as possible.  Feel free to call the clinic should you have any questions or concerns. The clinic phone number is [phone].  Please show the [place] at check-in to the Emergency Department and triage nurse.

## 2019-03-17 ENCOUNTER — Encounter: Payer: Self-pay | Admitting: Hematology

## 2019-03-17 ENCOUNTER — Encounter: Payer: Self-pay | Admitting: Internal Medicine

## 2019-03-17 ENCOUNTER — Other Ambulatory Visit: Payer: Self-pay

## 2019-03-17 ENCOUNTER — Inpatient Hospital Stay: Payer: Medicare Other

## 2019-03-17 VITALS — BP 97/63 | HR 89 | Temp 98.1°F | Resp 18

## 2019-03-17 DIAGNOSIS — C9000 Multiple myeloma not having achieved remission: Secondary | ICD-10-CM

## 2019-03-17 DIAGNOSIS — Z5112 Encounter for antineoplastic immunotherapy: Secondary | ICD-10-CM | POA: Diagnosis not present

## 2019-03-17 DIAGNOSIS — Z7189 Other specified counseling: Secondary | ICD-10-CM

## 2019-03-17 MED ORDER — SODIUM CHLORIDE 0.9% FLUSH
10.0000 mL | INTRAVENOUS | Status: DC | PRN
  Administered 2019-03-17: 10 mL
  Filled 2019-03-17: qty 10

## 2019-03-17 MED ORDER — DIPHENHYDRAMINE HCL 25 MG PO CAPS
ORAL_CAPSULE | ORAL | Status: AC
  Filled 2019-03-17: qty 1

## 2019-03-17 MED ORDER — SODIUM CHLORIDE 0.9 % IV SOLN
Freq: Once | INTRAVENOUS | Status: AC
  Administered 2019-03-17: 14:00:00 via INTRAVENOUS
  Filled 2019-03-17: qty 250

## 2019-03-17 MED ORDER — ACETAMINOPHEN 325 MG PO TABS
650.0000 mg | ORAL_TABLET | Freq: Once | ORAL | Status: AC
  Administered 2019-03-17: 650 mg via ORAL

## 2019-03-17 MED ORDER — ACETAMINOPHEN 325 MG PO TABS
ORAL_TABLET | ORAL | Status: AC
  Filled 2019-03-17: qty 2

## 2019-03-17 MED ORDER — FAMOTIDINE 20 MG PO TABS
20.0000 mg | ORAL_TABLET | Freq: Once | ORAL | Status: AC
  Administered 2019-03-17: 20 mg via ORAL

## 2019-03-17 MED ORDER — DIPHENHYDRAMINE HCL 25 MG PO TABS
25.0000 mg | ORAL_TABLET | Freq: Once | ORAL | Status: AC
  Administered 2019-03-17: 25 mg via ORAL
  Filled 2019-03-17: qty 1

## 2019-03-17 MED ORDER — ZOLEDRONIC ACID 4 MG/100ML IV SOLN
4.0000 mg | Freq: Once | INTRAVENOUS | Status: AC
  Administered 2019-03-17: 4 mg via INTRAVENOUS
  Filled 2019-03-17: qty 100

## 2019-03-17 MED ORDER — DEXAMETHASONE 4 MG PO TABS
ORAL_TABLET | ORAL | Status: AC
  Filled 2019-03-17: qty 5

## 2019-03-17 MED ORDER — FAMOTIDINE 20 MG PO TABS
ORAL_TABLET | ORAL | Status: AC
  Filled 2019-03-17: qty 1

## 2019-03-17 MED ORDER — DEXAMETHASONE 4 MG PO TABS
20.0000 mg | ORAL_TABLET | Freq: Once | ORAL | Status: AC
  Administered 2019-03-17: 20 mg via ORAL

## 2019-03-17 MED ORDER — DEXTROSE 5 % IV SOLN
27.0000 mg/m2 | Freq: Once | INTRAVENOUS | Status: AC
  Administered 2019-03-17: 50 mg via INTRAVENOUS
  Filled 2019-03-17: qty 15

## 2019-03-17 MED ORDER — PROCHLORPERAZINE MALEATE 10 MG PO TABS
10.0000 mg | ORAL_TABLET | Freq: Once | ORAL | Status: AC
  Administered 2019-03-17: 10 mg via ORAL

## 2019-03-17 MED ORDER — HEPARIN SOD (PORK) LOCK FLUSH 100 UNIT/ML IV SOLN
500.0000 [IU] | Freq: Once | INTRAVENOUS | Status: AC | PRN
  Administered 2019-03-17: 500 [IU]
  Filled 2019-03-17: qty 5

## 2019-03-17 MED ORDER — PROCHLORPERAZINE MALEATE 10 MG PO TABS
ORAL_TABLET | ORAL | Status: AC
  Filled 2019-03-17: qty 1

## 2019-03-17 NOTE — Patient Instructions (Signed)
[facility]charge [facility] for Patients Receiving Chemotherapy  Today you received the following chemotherapy agents:  Kyprolis  To help prevent nausea and vomiting after your treatment, we encourage you to take your nausea medication as directed.   If you develop nausea and vomiting that is not controlled by your nausea medication, call the clinic.   BELOW ARE SYMPTOMS THAT SHOULD BE REPORTED IMMEDIATELY:  *FEVER GREATER THAN 100.5 F  *CHILLS WITH OR WITHOUT FEVER  NAUSEA AND VOMITING THAT IS NOT CONTROLLED WITH YOUR NAUSEA MEDICATION  *UNUSUAL SHORTNESS OF BREATH  *UNUSUAL BRUISING OR BLEEDING  TENDERNESS IN MOUTH AND THROAT WITH OR WITHOUT PRESENCE OF ULCERS  *URINARY PROBLEMS  *BOWEL PROBLEMS  UNUSUAL RASH Items with * indicate a potential emergency and should be followed up as soon as possible.  Feel free to call the clinic should you have any questions or concerns. The clinic phone number is [phone].  Please show the CHEMO ALERT CARD at check-in to the Emergency Department and triage nurse.   

## 2019-03-19 ENCOUNTER — Encounter: Payer: Self-pay | Admitting: Hematology

## 2019-03-28 ENCOUNTER — Encounter: Payer: Self-pay | Admitting: Internal Medicine

## 2019-03-28 ENCOUNTER — Other Ambulatory Visit: Payer: Self-pay | Admitting: Hematology

## 2019-03-29 ENCOUNTER — Other Ambulatory Visit: Payer: Self-pay | Admitting: Internal Medicine

## 2019-03-29 ENCOUNTER — Encounter: Payer: Self-pay | Admitting: Internal Medicine

## 2019-03-29 MED ORDER — GLIPIZIDE ER 5 MG PO TB24: 5.0000 mg | ORAL_TABLET | Freq: Every day | ORAL | 0 refills | Status: DC

## 2019-03-30 ENCOUNTER — Inpatient Hospital Stay: Payer: Medicare Other

## 2019-03-30 ENCOUNTER — Other Ambulatory Visit: Payer: Self-pay

## 2019-03-30 VITALS — BP 119/77 | HR 93 | Temp 99.0°F | Resp 18

## 2019-03-30 DIAGNOSIS — C9000 Multiple myeloma not having achieved remission: Secondary | ICD-10-CM

## 2019-03-30 DIAGNOSIS — Z7189 Other specified counseling: Secondary | ICD-10-CM

## 2019-03-30 DIAGNOSIS — Z5112 Encounter for antineoplastic immunotherapy: Secondary | ICD-10-CM | POA: Diagnosis not present

## 2019-03-30 LAB — CMP (CANCER CENTER ONLY)
ALT: 36 U/L (ref 0–44)
AST: 29 U/L (ref 15–41)
Albumin: 3.4 g/dL — ABNORMAL LOW (ref 3.5–5.0)
Alkaline Phosphatase: 107 U/L (ref 38–126)
Anion gap: 12 (ref 5–15)
BUN: 10 mg/dL (ref 8–23)
CO2: 22 mmol/L (ref 22–32)
Calcium: 9.3 mg/dL (ref 8.9–10.3)
Chloride: 102 mmol/L (ref 98–111)
Creatinine: 0.73 mg/dL (ref 0.44–1.00)
GFR, Est AFR Am: >60 mL/min (ref >60)
GFR, Estimated: >60 mL/min (ref >60)
Glucose, Bld: 213 mg/dL — ABNORMAL HIGH (ref 70–99)
Potassium: 4.1 mmol/L (ref 3.5–5.1)
Sodium: 136 mmol/L (ref 135–145)
Total Bilirubin: 0.7 mg/dL (ref 0.3–1.2)
Total Protein: 6.8 g/dL (ref 6.5–8.1)

## 2019-03-30 LAB — CBC WITH DIFFERENTIAL/PLATELET
Abs Immature Granulocytes: 0.05 10*3/uL (ref 0.00–0.07)
Basophils Absolute: 0.1 10*3/uL (ref 0.0–0.1)
Basophils Relative: 1 %
Eosinophils Absolute: 0.3 10*3/uL (ref 0.0–0.5)
Eosinophils Relative: 3 %
HCT: 39.8 % (ref 36.0–46.0)
Hemoglobin: 13.2 g/dL (ref 12.0–15.0)
Immature Granulocytes: 1 %
Lymphocytes Relative: 19 %
Lymphs Abs: 1.6 10*3/uL (ref 0.7–4.0)
MCH: 30.9 pg (ref 26.0–34.0)
MCHC: 33.2 g/dL (ref 30.0–36.0)
MCV: 93.2 fL (ref 80.0–100.0)
Monocytes Absolute: 1.3 10*3/uL — ABNORMAL HIGH (ref 0.1–1.0)
Monocytes Relative: 15 %
Neutro Abs: 5.3 10*3/uL (ref 1.7–7.7)
Neutrophils Relative %: 61 %
Platelets: 275 10*3/uL (ref 150–400)
RBC: 4.27 MIL/uL (ref 3.87–5.11)
RDW: 13.2 % (ref 11.5–15.5)
WBC: 8.7 10*3/uL (ref 4.0–10.5)
nRBC: 0 % (ref 0.0–0.2)

## 2019-03-30 MED ORDER — HEPARIN SOD (PORK) LOCK FLUSH 100 UNIT/ML IV SOLN
500.0000 [IU] | Freq: Once | INTRAVENOUS | Status: AC | PRN
  Administered 2019-03-30: 500 [IU]
  Filled 2019-03-30: qty 5

## 2019-03-30 MED ORDER — DEXAMETHASONE 4 MG PO TABS
20.0000 mg | ORAL_TABLET | Freq: Once | ORAL | Status: AC
  Administered 2019-03-30: 20 mg via ORAL

## 2019-03-30 MED ORDER — ACETAMINOPHEN 325 MG PO TABS
650.0000 mg | ORAL_TABLET | Freq: Once | ORAL | Status: AC
  Administered 2019-03-30: 650 mg via ORAL

## 2019-03-30 MED ORDER — DIPHENHYDRAMINE HCL 25 MG PO TABS
25.0000 mg | ORAL_TABLET | Freq: Once | ORAL | Status: AC
  Administered 2019-03-30: 25 mg via ORAL
  Filled 2019-03-30: qty 1

## 2019-03-30 MED ORDER — FAMOTIDINE 20 MG PO TABS: 20.0000 mg | ORAL_TABLET | Freq: Once | ORAL | Status: DC

## 2019-03-30 MED ORDER — PROCHLORPERAZINE MALEATE 10 MG PO TABS
10.0000 mg | ORAL_TABLET | Freq: Once | ORAL | Status: AC
  Administered 2019-03-30: 15:00:00 10 mg via ORAL

## 2019-03-30 MED ORDER — SODIUM CHLORIDE 0.9% FLUSH
10.0000 mL | INTRAVENOUS | Status: DC | PRN
  Administered 2019-03-30: 10 mL
  Filled 2019-03-30: qty 10

## 2019-03-30 MED ORDER — SODIUM CHLORIDE 0.9% FLUSH
10.0000 mL | Freq: Once | INTRAVENOUS | Status: AC | PRN
  Administered 2019-03-30: 10 mL
  Filled 2019-03-30: qty 10

## 2019-03-30 MED ORDER — SODIUM CHLORIDE 0.9 % IV SOLN
Freq: Once | INTRAVENOUS | Status: AC
  Administered 2019-03-30: 15:00:00 via INTRAVENOUS
  Filled 2019-03-30: qty 250

## 2019-03-30 MED ORDER — PROCHLORPERAZINE MALEATE 10 MG PO TABS
ORAL_TABLET | ORAL | Status: AC
  Filled 2019-03-30: qty 1

## 2019-03-30 MED ORDER — DEXTROSE 5 % IV SOLN
27.0000 mg/m2 | Freq: Once | INTRAVENOUS | Status: AC
  Administered 2019-03-30: 16:00:00 50 mg via INTRAVENOUS
  Filled 2019-03-30: qty 15

## 2019-03-30 MED ORDER — DIPHENHYDRAMINE HCL 25 MG PO CAPS
ORAL_CAPSULE | ORAL | Status: AC
  Filled 2019-03-30: qty 1

## 2019-03-30 MED ORDER — ACETAMINOPHEN 325 MG PO TABS
ORAL_TABLET | ORAL | Status: AC
  Filled 2019-03-30: qty 2

## 2019-03-30 MED ORDER — DEXAMETHASONE 4 MG PO TABS
ORAL_TABLET | ORAL | Status: AC
  Filled 2019-03-30: qty 5

## 2019-03-30 NOTE — Patient Instructions (Signed)
[facility] Discharge Instructions for Patients Receiving Chemotherapy  Today you received the following chemotherapy agents:  Kyprolis  To help prevent nausea and vomiting after your treatment, we encourage you to take your nausea medication as directed.   If you develop nausea and vomiting that is not controlled by your nausea medication, call the clinic.   BELOW ARE SYMPTOMS THAT SHOULD BE REPORTED IMMEDIATELY:  *FEVER GREATER THAN 100.5 F  *CHILLS WITH OR WITHOUT FEVER  NAUSEA AND VOMITING THAT IS NOT CONTROLLED WITH YOUR NAUSEA MEDICATION  *UNUSUAL SHORTNESS OF BREATH  *UNUSUAL BRUISING OR BLEEDING  TENDERNESS IN MOUTH AND THROAT WITH OR WITHOUT PRESENCE OF ULCERS  *URINARY PROBLEMS  *BOWEL PROBLEMS  UNUSUAL RASH Items with * indicate a potential emergency and should be followed up as soon as possible.  Feel free to call the clinic should you have any questions or concerns. The clinic phone number is [phone].  Please show the CHEMO ALERT CARD at check-in to the Emergency Department and triage nurse.   

## 2019-03-31 ENCOUNTER — Other Ambulatory Visit: Payer: Self-pay

## 2019-03-31 ENCOUNTER — Inpatient Hospital Stay: Payer: Medicare Other

## 2019-03-31 VITALS — BP 120/67 | HR 92 | Temp 97.6°F | Resp 18

## 2019-03-31 DIAGNOSIS — Z5112 Encounter for antineoplastic immunotherapy: Secondary | ICD-10-CM | POA: Diagnosis not present

## 2019-03-31 DIAGNOSIS — Z7189 Other specified counseling: Secondary | ICD-10-CM

## 2019-03-31 DIAGNOSIS — C9000 Multiple myeloma not having achieved remission: Secondary | ICD-10-CM

## 2019-03-31 MED ORDER — SODIUM CHLORIDE 0.9 % IV SOLN
Freq: Once | INTRAVENOUS | Status: DC
  Filled 2019-03-31: qty 250

## 2019-03-31 MED ORDER — PROCHLORPERAZINE MALEATE 10 MG PO TABS
10.0000 mg | ORAL_TABLET | Freq: Once | ORAL | Status: AC
  Administered 2019-03-31: 10 mg via ORAL

## 2019-03-31 MED ORDER — ACETAMINOPHEN 325 MG PO TABS
650.0000 mg | ORAL_TABLET | Freq: Once | ORAL | Status: AC
  Administered 2019-03-31: 650 mg via ORAL

## 2019-03-31 MED ORDER — DEXTROSE 5 % IV SOLN
27.0000 mg/m2 | Freq: Once | INTRAVENOUS | Status: AC
  Administered 2019-03-31: 50 mg via INTRAVENOUS
  Filled 2019-03-31: qty 15

## 2019-03-31 MED ORDER — PROCHLORPERAZINE MALEATE 10 MG PO TABS
ORAL_TABLET | ORAL | Status: AC
  Filled 2019-03-31: qty 1

## 2019-03-31 MED ORDER — SODIUM CHLORIDE 0.9 % IV SOLN
Freq: Once | INTRAVENOUS | Status: AC
  Administered 2019-03-31: 13:00:00 via INTRAVENOUS
  Filled 2019-03-31: qty 250

## 2019-03-31 MED ORDER — FAMOTIDINE 20 MG PO TABS
20.0000 mg | ORAL_TABLET | Freq: Once | ORAL | Status: AC
  Administered 2019-03-31: 13:00:00 20 mg via ORAL

## 2019-03-31 MED ORDER — DEXAMETHASONE 4 MG PO TABS
20.0000 mg | ORAL_TABLET | Freq: Once | ORAL | Status: AC
  Administered 2019-03-31: 20 mg via ORAL

## 2019-03-31 MED ORDER — HEPARIN SOD (PORK) LOCK FLUSH 100 UNIT/ML IV SOLN
500.0000 [IU] | Freq: Once | INTRAVENOUS | Status: AC | PRN
  Administered 2019-03-31: 500 [IU]
  Filled 2019-03-31: qty 5

## 2019-03-31 MED ORDER — SODIUM CHLORIDE 0.9% FLUSH
10.0000 mL | INTRAVENOUS | Status: DC | PRN
  Administered 2019-03-31: 10 mL
  Filled 2019-03-31: qty 10

## 2019-03-31 MED ORDER — DIPHENHYDRAMINE HCL 25 MG PO TABS
25.0000 mg | ORAL_TABLET | Freq: Once | ORAL | Status: AC
  Administered 2019-03-31: 13:00:00 25 mg via ORAL
  Filled 2019-03-31: qty 1

## 2019-03-31 MED ORDER — FAMOTIDINE 20 MG PO TABS
ORAL_TABLET | ORAL | Status: AC
  Filled 2019-03-31: qty 1

## 2019-03-31 MED ORDER — DIPHENHYDRAMINE HCL 25 MG PO CAPS
ORAL_CAPSULE | ORAL | Status: AC
  Filled 2019-03-31: qty 1

## 2019-03-31 MED ORDER — DEXAMETHASONE 4 MG PO TABS
ORAL_TABLET | ORAL | Status: AC
  Filled 2019-03-31: qty 5

## 2019-03-31 MED ORDER — ACETAMINOPHEN 325 MG PO TABS
ORAL_TABLET | ORAL | Status: AC
  Filled 2019-03-31: qty 23

## 2019-03-31 NOTE — Patient Instructions (Signed)
[facility] Discharge Instructions for Patients Receiving Chemotherapy  Today you received the following chemotherapy agents:  Kyprolis  To help prevent nausea and vomiting after your treatment, we encourage you to take your nausea medication as directed.   If you develop nausea and vomiting that is not controlled by your nausea medication, call the clinic.   BELOW ARE SYMPTOMS THAT SHOULD BE REPORTED IMMEDIATELY:  *FEVER GREATER THAN 100.5 F  *CHILLS WITH OR WITHOUT FEVER  NAUSEA AND VOMITING THAT IS NOT CONTROLLED WITH YOUR NAUSEA MEDICATION  *UNUSUAL SHORTNESS OF BREATH  *UNUSUAL BRUISING OR BLEEDING  TENDERNESS IN MOUTH AND THROAT WITH OR WITHOUT PRESENCE OF ULCERS  *URINARY PROBLEMS  *BOWEL PROBLEMS  UNUSUAL RASH Items with * indicate a potential emergency and should be followed up as soon as possible.  Feel free to call the clinic should you have any questions or concerns. The clinic phone number is [phone].  Please show the CHEMO ALERT CARD at check-in to the Emergency Department and triage nurse.   

## 2019-04-01 ENCOUNTER — Encounter: Payer: Self-pay | Admitting: Hematology

## 2019-04-04 ENCOUNTER — Other Ambulatory Visit: Payer: Self-pay | Admitting: *Deleted

## 2019-04-04 DIAGNOSIS — C9000 Multiple myeloma not having achieved remission: Secondary | ICD-10-CM

## 2019-04-04 MED ORDER — LENALIDOMIDE 15 MG PO CAPS: 15.0000 mg | ORAL_CAPSULE | Freq: Every day | ORAL | 0 refills | Status: DC

## 2019-04-04 NOTE — Telephone Encounter (Signed)
Revlimid refill per office visit note 03/09/2019. Refill sent to [facility]) [place], [place] EQFD#[id number], 04/04/2019

## 2019-04-05 NOTE — Progress Notes (Signed)
HEMATOLOGY/ONCOLOGY CLINIC NOTE  Date of Service: 04/06/2019  Patient Care Team: Hoyt Koch, MD as PCP - General (Internal Medicine) Lafayette Dragon, MD (Inactive) as Consulting Physician (Gastroenterology) Megan Salon, MD as Consulting Physician (Gynecology) Melrose Nakayama, MD as Consulting Physician (Orthopedic Surgery) Deneise Lever, MD as Consulting Physician (Pulmonary Disease) [name], MD (Ophthalmology)  CHIEF COMPLAINTS/PURPOSE OF CONSULTATION:  myeloma  HISTORY OF PRESENTING ILLNESS:   Faith Orr is a wonderful 78 y.o. female who has been referred to Faith Orr by Dr. Pricilla Holm for evaluation and management of Lytic bone lesions. She is accompanied today by her son in law, and her partner is present via phone. The pt reports that she is doing well overall.   The pt notes that 2-3 months ago while standing at the stove, and otherwise feeling normally, she felt "something snap that took her breath away" in her mid back as she stretched to get something. The pt notes that she saw a chiropractor twice due to her back stiffness, which did not help. The pt then described her new bone pains to her PCP on 12/05/18, and subsequent imaging, as noted below, revealed concerns for numerous bone lesions. She has begun 51m Fosamax. The pt notes that she had hip pain in 2018, and that an XR at that time did not reveal any lesions.  The pt notes that most of her pain is concentrated to her left shoulder presently, and with minimal movement of the arm. She endorses pain radiating into her left arm and notes that her hand is swollen in the mornings when she wakes up. She also endorses present back pain and right hip pain, worse when she walks. She has not yet seen orthopedics. The pt reports that she is needing to take 6045mAdvil every 4-6 hours as Tramadol alone has not been able to alleviate her pain. The pt denies any unexpected weight loss, fevers, chills, or night  sweats. The pt notes that her urine is a very dark color presently, but denies overt blood in the urine, underpants, nor tissue paper. The pt notes that in the last two weeks she has had some soreness in her head, but denies new headaches or changes in vision. The pt notes that she has been urinating more frequently overall, but has been trying to stay better hydrated as well.  The pt notes that she has been compliant with annual mammograms.   The pt notes that she had a cyst in her right breast which was removed in the past. She fractured her left wrist in 2008 after falling down stairs. The pt endorses history of fatty liver.   The pt denies ever smoking cigarettes and endorses significant second hand smoke exposure with a previous marriage.   Of note prior to the patient's visit today, pt has had a Bone Scan completed on 12/13/18 with results revealing Multifocal uptake throughout the skeleton, consistent with diffuse metastatic disease. Primary tumor is not specified. 2. Uptake in the proximal right femur, consistent with lytic lesions. 3. Uptake in the ribs bilaterally as described. 4. Lesions in the proximal left humerus. 5. Diffuse uptake throughout the skull consistent with metastatic disease. 6. Right paramedian uptake at the manubrium.  Most recent lab results (12/08/18) of CBC w/diff and CMP is as follows: all values are WNL except for Glucose at 279, BUN at 24, AST at 41, ALT at 46. 12/08/18 SPEP revealed all values WNL except for Total Protein at 6.0,  Albumin at 3.6, Gamma globulin at 0.7, and M spike at 0.5g  On review of systems, pt reports significant left shoulder pain, back pain, right hip pain, dark urine, and denies fevers, chills, night sweats, unexpected weight loss, changes in bowel habits, changes in breathing, cough, new respiratory symptoms, changes in vision, abdominal pains, leg swelling, and any other symptoms.   On PMHx the pt reports fatty liver, and denies blood clots.    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 Denies ever smoking.  On Family Hx the pt reports maternal grandmother with colon cancer. Father with bladder cancer and amyloidosis (pt notes that this could have been misdiagnosis). Mother with Protein S deficiency and polymyalgia rheumatica.  Interval History:   Faith Orr returns today for management and evaluation of her newly diagnosed Multiple myeloma and C3D8 treatment. The patient's last visit with Faith Orr was on 03/09/19. The pt reports that she is doing well overall.  The pt reports that she has decided not to move forward with prophylactic fixation surgery. She notes that she has not had repeat rashes and she has held her Famotidine and Claritin, though I have asked her to return to these daily. She notes that she has had some itching and denies using lotion. The pt denies having any repeated fevers.  The pt notes that her pain is mostly well controlled at this time. In addition to the Fentanyl patch, she has continued with 2 break through Oxycodone each day. She is moving her bowels well. She notes that she walks around in her house carefully, and uses her scooter when she needs to traverse longer distances.  The pt notes that her energy levels are better overall, and associates this with starting Glipizide. She notes that her highest glucose level since beginning glipizide has not reached into the 300s. The pt notes that she has periodic "shaking" in her hands, and notes that her anxiety level is overall higher related to the Covid-19 pandemic. She denies tingling or numbness in her hands or feet.  Lab results today (04/06/19) of CBC w/diff and CMP is as follows: all values are WNL except for PLT at 144k, Eosinophils abs at 700, Abs immature granulocytes at 0.13k, CO2 at 21, Glucose at 221, Total Protein at 6.2, Albumin at 3.0.  On review of systems, pt reports improved energy levels, some itching,  improved bone pain, moving her bowels well, and denies concerns for infections, fevers, tingling or numbness in her hands or feet, problems with the port, abdominal pains, arm pain, leg swelling, and any other symptoms.   MEDICAL HISTORY:  Past Medical History:  Diagnosis Date   Allergy    seasonal   Asthma    DEPRESSION    DIABETES MELLITUS, TYPE II    Diverticulosis    HYPERLIPIDEMIA    Macular degeneration of left eye    mild, Dr.Hecker   Obesity, unspecified    Osteoarthritis of both knees    OSTEOPENIA    Osteopenia    URINARY INCONTINENCE     SURGICAL HISTORY: Past Surgical History:  Procedure Laterality Date   BREAST SURGERY     CATARACT EXTRACTION Left 05/24/2018   CESAREAN SECTION  01/1973   FRACTURE SURGERY     IR IMAGING GUIDED PORT INSERTION  02/20/2019   left wrist surgery  2008   By Dr. Latanya Maudlin   right ankle  1994    SOCIAL HISTORY: Social History  Socioeconomic History   Marital status: Married    Spouse name: Not on file   Number of children: 1   Years of education: Not on file   Highest education level: Not on file  Occupational History    Employer: [employer] resource strain: Patient refused   Food insecurity:    Worry: Patient refused    Inability: Patient refused   Transportation needs:    Medical: Patient refused    Non-medical: Patient refused  Tobacco Use   Smoking status: Never Smoker   Smokeless tobacco: Never Used   Tobacco comment: Lives with partner [name]) and son  Substance and Sexual Activity   Alcohol use: No    Alcohol/week: 0.0 standard drinks   Drug use: No   Sexual activity: Never    Partners: Female    Birth control/protection: Post-menopausal    Comment: Lives with female partner (annette hicks) and 59 yo son  Lifestyle   Physical activity:    Days per week: Patient refused    Minutes per session: Patient refused   Stress: Patient  refused  Relationships   Social connections:    Talks on phone: Patient refused    Gets together: Patient refused    Attends religious service: Patient refused    Active member of club or organization: Patient refused    Attends meetings of clubs or organizations: Patient refused    Relationship status: Patient refused   Intimate partner violence:    Fear of current or ex partner: Patient refused    Emotionally abused: Patient refused    Physically abused: Patient refused    Forced sexual activity: Patient refused  Other Topics Concern   Not on file  Social History Narrative   Not on file    FAMILY HISTORY: Family History  Problem Relation Age of Onset   Diabetes Father    Hyperlipidemia Father    Heart disease Father    Cancer Father    Hypertension Father    Colon cancer Paternal Grandmother 73   Osteoporosis Mother    Protein S deficiency Mother    Hyperlipidemia Mother    Multiple sclerosis Daughter    Cancer Other        bladder   Breast cancer Neg Hx     ALLERGIES:  is allergic to penicillins; aleve [naproxen sodium]; and sulfonamide derivatives.  MEDICATIONS:  Current Outpatient Medications  Medication Sig Dispense Refill   acyclovir (ZOVIRAX) 400 MG tablet Take 1 tablet (400 mg total) by mouth 2 (two) times daily. 60 tablet 3   albuterol (PROVENTIL HFA;VENTOLIN HFA) 108 (90 Base) MCG/ACT inhaler INHALE 1 TO 2 PUFFS INTO THE LUNGS EVERY 6 HOURS AS NEEDED FOR WHEEZING OR SHORTNESS OF BREATH (Patient not taking: No sig reported) 54 g 1   aspirin EC 81 MG tablet Take 81 mg by mouth daily after breakfast.      Blood Glucose Monitoring Suppl (FREESTYLE FREEDOM LITE) W/DEVICE KIT Use to check blood sugars twice a day Dx 250.00 1 each 0   Calcium Carbonate-Vitamin D (CALCIUM 600+D HIGH POTENCY) 600-400 MG-UNIT per tablet Take 1 tablet by mouth 2 (two) times daily.      Cetirizine HCl 10 MG CAPS Take 1 capsule (10 mg total) by mouth daily. 30  capsule 1   Continuous Blood Gluc Sensor (FREESTYLE LIBRE 14 DAY SENSOR) MISC 1 each by Does not apply route every 14 (fourteen) days. 2 each 11   dexamethasone (  DECADRON) 4 MG tablet Take 5 tablets (20 mg total) by mouth once a week. On D22 of each cycle of treatment 20 tablet 5   fentaNYL (DURAGESIC) 12 MCG/HR Place 1 patch onto the skin every 3 (three) days. 10 patch 0   fexofenadine (ALLEGRA) 60 MG tablet Take 1 tablet (60 mg total) by mouth 2 (two) times daily. 60 tablet 1   fluticasone (FLONASE) 50 MCG/ACT nasal spray Place 1 spray into both nostrils daily. (Patient taking differently: Place 1 spray into both nostrils daily as needed for allergies or rhinitis. ) 16 g 2   glipiZIDE (GLUCOTROL XL) 5 MG 24 hr tablet TAKE 1 TABLET(5 MG) BY MOUTH DAILY WITH BREAKFAST 90 tablet 0   glucose blood (FREESTYLE LITE) test strip CHECK BLOOD SUGAR TWICE DAILY AS DIRECTED Dx 250.00 180 each 3   Lancets (FREESTYLE) lancets Use twice daily to check sugars. 100 each 11   lenalidomide (REVLIMID) 15 MG capsule Take 1 capsule (15 mg total) by mouth daily. Take for 21 days on, 7 days off, repeat every 28 days. 21 capsule 0   lidocaine-prilocaine (EMLA) cream Apply 1 application topically as needed. Use prior to port access. 30 g 0   metFORMIN (GLUCOPHAGE-XR) 500 MG 24 hr tablet Take 3 tablets (1,500 mg total) by mouth daily with breakfast. (Patient taking differently: Take 500-1,000 mg by mouth See admin instructions. 500 mg every morning, 1,000 mg every night) 270 tablet 3   montelukast (SINGULAIR) 10 MG tablet TAKE 1 TABLET BY MOUTH DAILY AS NEEDED (Patient taking differently: Take 10 mg by mouth at bedtime as needed (allergies). ) 30 tablet 3   Multiple Vitamins-Minerals (ICAPS) CAPS Take 1 capsule by mouth daily after breakfast.      ondansetron (ZOFRAN) 8 MG tablet Take 1 tablet (8 mg total) by mouth 2 (two) times daily as needed (Nausea or vomiting). 30 tablet 1   Oxycodone HCl 10 MG TABS Take  1 tablet (10 mg total) by mouth every 6 (six) hours as needed. 90 tablet 0   pantoprazole (PROTONIX) 20 MG tablet TAKE 1 TABLET(20 MG) BY MOUTH DAILY (Patient not taking: No sig reported) 30 tablet 5   polyethylene glycol (MIRALAX / GLYCOLAX) packet Take 17 g by mouth daily after breakfast.      prochlorperazine (COMPAZINE) 10 MG tablet Take 1 tablet (10 mg total) by mouth every 6 (six) hours as needed (Nausea or vomiting). 30 tablet 1   senna-docusate (SENOKOT-S) 8.6-50 MG tablet Take 2 tablets by mouth at bedtime. 60 tablet 1   sertraline (ZOLOFT) 50 MG tablet TAKE 1 TABLET BY MOUTH DAILY (Patient taking differently: Take 50 mg by mouth at bedtime. ) 90 tablet 1   simvastatin (ZOCOR) 20 MG tablet TAKE 1 TABLET(20 MG) BY MOUTH DAILY (Patient taking differently: Take 20 mg by mouth every evening. ) 90 tablet 1   Triamcinolone Acetonide 0.025 % LOTN Apply 1 application topically 3 (three) times daily as needed (rash/itching). 60 mL 2   Vitamin D, Ergocalciferol, (DRISDOL) 1.25 MG (50000 UT) CAPS capsule TAKE 1 CAPSULE BY MOUTH EVERY 7 DAYS 12 capsule 0   No current facility-administered medications for this visit.     REVIEW OF SYSTEMS:    A 10+ POINT REVIEW OF SYSTEMS WAS OBTAINED including neurology, dermatology, psychiatry, cardiac, respiratory, lymph, extremities, GI, GU, Musculoskeletal, constitutional, breasts, reproductive, HEENT.  All pertinent positives are noted in the HPI.  All others are negative.   PHYSICAL EXAMINATION: ECOG PERFORMANCE STATUS: 2 -  Symptomatic, <50% confined to bed  Vitals:   04/06/19 1323  BP: 127/70  Pulse: 92  Resp: 18  Temp: 98.1 F (36.7 C)  SpO2: 98%   Filed Weights   04/06/19 1323  Weight: 171 lb 4.8 oz (77.7 kg)   .Body mass index is 33.74 kg/m.  GENERAL:alert, in no acute distress and comfortable SKIN: no acute rashes, no significant lesions EYES: conjunctiva are pink and non-injected, sclera anicteric OROPHARYNX: MMM, no exudates,  no oropharyngeal erythema or ulceration NECK: supple, no JVD LYMPH:  no palpable lymphadenopathy in the cervical, axillary or inguinal regions LUNGS: clear to auscultation b/l with normal respiratory effort HEART: regular rate & rhythm ABDOMEN:  normoactive bowel sounds , non tender, not distended. No palpable hepatosplenomegaly.  Extremity: no pedal edema PSYCH: alert & oriented x 3 with fluent speech NEURO: no focal motor/sensory deficits   LABORATORY DATA:  I have reviewed the data as listed  . CBC Latest Ref Rng & Units 04/06/2019 03/30/2019 03/16/2019  WBC 4.0 - 10.5 K/uL 7.5 8.7 10.3  Hemoglobin 12.0 - 15.0 g/dL 12.5 13.2 12.7  Hematocrit 36.0 - 46.0 % 38.1 39.8 36.9  Platelets 150 - 400 K/uL 144(L) 275 174   . CBC    Component Value Date/Time   WBC 7.5 04/06/2019 1226   RBC 4.05 04/06/2019 1226   HGB 12.5 04/06/2019 1226   HGB 12.8 02/16/2019 1138   HCT 38.1 04/06/2019 1226   PLT 144 (L) 04/06/2019 1226   PLT 170 02/16/2019 1138   MCV 94.1 04/06/2019 1226   MCH 30.9 04/06/2019 1226   MCHC 32.8 04/06/2019 1226   RDW 13.6 04/06/2019 1226   LYMPHSABS 1.5 04/06/2019 1226   MONOABS 0.8 04/06/2019 1226   EOSABS 0.7 (H) 04/06/2019 1226   BASOSABS 0.1 04/06/2019 1226    . CMP Latest Ref Rng & Units 04/06/2019 03/30/2019 03/16/2019  Glucose 70 - 99 mg/dL 221(H) 213(H) 312(H)  BUN 8 - 23 mg/dL _0 Creatinine 0.44 - 1.00 mg/dL 0.71 0.73 0.72  Sodium 135 - 145 mmol/L 138 136 136  Potassium 3.5 - 5.1 mmol/L 3.8 4.1 3.7  Chloride 98 - 111 mmol/L 105 102 103  CO2 22 - 32 mmol/L 21(L) 22 21(L)  Calcium 8.9 - 10.3 mg/dL 9.2 9.3 9.3  Total Protein 6.5 - 8.1 g/dL 6.2(L) 6.8 6.0(L)  Total Bilirubin 0.3 - 1.2 mg/dL 0.6 0.7 0.9  Alkaline Phos 38 - 126 U/L 92 107 105  AST 15 - 41 U/L 28 29 40  ALT 0 - 44 U/L 34 36 72(H)   Component     Latest Ref Rng & Units 12/27/2018  IgG (Immunoglobin G), Serum     700 - 1,600 mg/dL 801  IgA     64 - 422 mg/dL 91  IgM (Immunoglobulin  M), Srm     26 - 217 mg/dL 15 (L)  Total Protein ELP     6.0 - 8.5 g/dL 6.6  Albumin SerPl Elph-Mcnc     2.9 - 4.4 g/dL 3.7  Alpha 1     0.0 - 0.4 g/dL 0.2  Alpha2 Glob SerPl Elph-Mcnc     0.4 - 1.0 g/dL 0.8  B-Globulin SerPl Elph-Mcnc     0.7 - 1.3 g/dL 1.2  Gamma Glob SerPl Elph-Mcnc     0.4 - 1.8 g/dL 0.7  M Protein SerPl Elph-Mcnc     Not Observed g/dL 0.5 (H)  Globulin, Total     2.2 - 3.9 g/dL  2.9  Albumin/Glob SerPl     0.7 - 1.7 1.3  IFE 1      Comment  Please Note (HCV):      Comment  Kappa free light chain     3.3 - 19.4 mg/L 5.1  Lamda free light chains     5.7 - 26.3 mg/L 40.3 (H)  Kappa, lamda light chain ratio     0.26 - 1.65 0.13 (L)  Beta-2 Microglobulin     0.6 - 2.4 mg/L 1.7  LDH     98 - 192 U/L 162  Sed Rate     0 - 22 mm/hr 8       01/06/19 Cytogenetics:       RADIOGRAPHIC STUDIES: I have personally reviewed the radiological images as listed and agreed with the findings in the report. No results found.  ASSESSMENT & PLAN:   78 y.o. female with  1. Recently diagnosed Multiple Myeloma, RISS Stage III  Labs upon initial presentation from 12/08/18, blood counts are normal including WBC at 7.1k, HGB at 13.1, and PLT at 245k. Calcium normal at 10.3. Creatinine normal at 0.63. M spike at 0.5g. 12/13/18 Bone Scan revealed Multifocal uptake throughout the skeleton, consistent with diffuse metastatic disease. Primary tumor is not specified. 2. Uptake in the proximal right femur, consistent with lytic lesions. 3. Uptake in the ribs bilaterally as described. 4. Lesions in the proximal left humerus. 5. Diffuse uptake throughout the skull consistent with metastatic disease. 6. Right paramedian uptake at the manubrium.  12/13/18 CT Right Femur revealed Numerous lytic lesions involving the right femur and a lytic lesion in the left inferior pubic ramus. Overall appearance is most concerning for multiple myeloma  12/27/18 Pretreatment 24hour UPEP  observed an M spike at 33m, and showed 1954mtotal protein/day.  12/27/18 Pretreatment MMP revealed M Protein at 0.5g with IgG Lambda specificity. Kappa:Lambda light chain ratio at 0.13, with Lambda at 40.3. There is less abnormal protein and light chains than I would expect from 30% plasma cells, which suggests hypo-secretory or non-secretory neoplastic plasma cells. Will have an impact in assessing response. 01/05/19 PET/CT revealed Innumerable lytic lesions in the skeleton compatible with myeloma. Most of the larger lesions are hypermetabolic, for example including a left proximal humeral shaft lesion with maximum SUV of 8.1 and a 2.8 cm lesion in the left T9 vertebral body with maximum SUV 5.1. Most of the smaller lytic lesions, and some of the larger lesions, do not demonstrate accentuated metabolic activity. 2. 1.2 cm in short axis lymph node in the left parapharyngeal space is hypermetabolic with maximum SUV 11.8. I do not see a separate mass in the head and neck to give rise to this hypermetabolic lymph node. 3. Mosaic attenuation in the lower lobes, nonspecific possibly from air trapping. 4.  Aortic Atherosclerosis 5. Heterogeneous activity in the liver, making it hard to exclude small liver lesions. Consider hepatic protocol MRI with and without contrast for definitive assessment. Nonobstructive right nephrolithiasis. Old granulomatous disease  01/06/19 Bone Marrow biopsy revealed interstitial increase in plasma cells (28% aspirate, 40% CD138 immunohistochemistry). Plasma cells negative for light chains consistent with a non or weakly secretory myeloma   01/06/19 Cytogenetics revealed 37% of cells with trisomy 11 or 11q deletion, and 40.5% of cells with 17p mutation  2. 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  01/17/19 MRI Liver revealed Several appreciable liver lesions all have benign imaging  characteristics. No MRI findings of metastatic involvement of the liver. 2. Scattered bony lesions corresponding to the lytic lesions seen at PET-CT, compatible with active myeloma. 3. Aortic Atherosclerosis.  Mild cardiomegaly. 4. Diffuse hepatic steatosis.   PLAN -Discussed pt labwork today, 04/06/19; blood counts and chemistries are stable. -Discussed last available MMP from 03/02/19 revealed M Protein at 0.2g and last SFLC from 03/02/19 revealed Lambda light chains normalized to 14.8 with K:L ratio normal at 0.48 -Discussed again that the pt appears to have a hypo-secretory presentation, and thus these labs results are indicative of a response, but the most accurate measure of her response will require a repeat BM Biopsy after completing 6 cycles -The pt has no prohibitive toxicities from continuing C3D8 Carfilzomib, Revlimid, and Dexamethasone at this time. -Continue 71m Claritin or Zyrtec and 242mPepcid for mild rashes, will continue to watch these -Will treat until maximum response, or plateau response -Adjusted premedications to add Tylenol and will split dose of Dexamethasone with pre-meds and pt will not take Decadron at home except on D22 -Okay to take Tylenol at home if feeling flushed, however pt will seek medical attention if fevers rise above 100.4 or repeat in frequency -Will watch lymph node in left parapharyngeal space seen on PET/CT -Pt decided to hold off on prophylactic interventions to mitigate risk of pathologic fractures with Dr. [facility]int [facility] lifting, pulling, pushing with left upper extremity  -Avoid high impact activities and stairs to preserve right hip as well -May be a role for local radiation therapy for pain control at T9 and left shoulder -Continue Fentanyl patch and 1074mxycodone for break through pain -Continue Zofran for opiate-related nausea -Continue Miralax once a day, and Senna S two tablets BID, back off as constipation  resolves -Continue Zometa -Continue Acyclovir and 37m59mpirin to mitigate risk of Shingles and blood clots respectively -Continue follow up with PCP Dr. ElizPricilla Holm management and evaluation of her DM, especially while on treatment with steroids as blood sugars will be higher -Will see the pt back with C4D1   Please schedule C4 and C5 of treatment as ordered Labs on D1, D8 and D15 of each treatment MD visit on D1 of each cycle of treatment Continue Zometa q4weeks   All of the patients questions were answered with apparent satisfaction. The patient knows to call the clinic with any problems, questions or concerns.  The total time spent in the appt was 25 minutes and more than 50% was on counseling and direct patient cares.    GautSullivan LoneMS AAHIVMS SCH [facility]/Oncology Physician [facility]):       [phone] cell):  [phone]):           [phone]/2020 2:11 PM  I, SchuBaldwin [place] acting as a scribe for Dr. GautSullivan Lone.I have reviewed the above documentation for accuracy and completeness, and I agree with the above. .GauBrunetta Genera

## 2019-04-06 ENCOUNTER — Inpatient Hospital Stay: Payer: Medicare Other

## 2019-04-06 ENCOUNTER — Other Ambulatory Visit: Payer: Self-pay

## 2019-04-06 ENCOUNTER — Telehealth: Payer: Self-pay | Admitting: Hematology

## 2019-04-06 ENCOUNTER — Inpatient Hospital Stay: Payer: Medicare Other | Admitting: Hematology

## 2019-04-06 VITALS — BP 127/70 | HR 92 | Temp 98.1°F | Resp 18 | Ht 59.75 in | Wt 171.3 lb

## 2019-04-06 DIAGNOSIS — C9000 Multiple myeloma not having achieved remission: Secondary | ICD-10-CM

## 2019-04-06 DIAGNOSIS — Z7189 Other specified counseling: Secondary | ICD-10-CM

## 2019-04-06 DIAGNOSIS — Z95828 Presence of other vascular implants and grafts: Secondary | ICD-10-CM

## 2019-04-06 DIAGNOSIS — Z5112 Encounter for antineoplastic immunotherapy: Secondary | ICD-10-CM | POA: Diagnosis not present

## 2019-04-06 LAB — CBC WITH DIFFERENTIAL/PLATELET
Abs Immature Granulocytes: 0.13 10*3/uL — ABNORMAL HIGH (ref 0.00–0.07)
Basophils Absolute: 0.1 10*3/uL (ref 0.0–0.1)
Basophils Relative: 1 %
Eosinophils Absolute: 0.7 10*3/uL — ABNORMAL HIGH (ref 0.0–0.5)
Eosinophils Relative: 9 %
HCT: 38.1 % (ref 36.0–46.0)
Hemoglobin: 12.5 g/dL (ref 12.0–15.0)
Immature Granulocytes: 2 %
Lymphocytes Relative: 20 %
Lymphs Abs: 1.5 10*3/uL (ref 0.7–4.0)
MCH: 30.9 pg (ref 26.0–34.0)
MCHC: 32.8 g/dL (ref 30.0–36.0)
MCV: 94.1 fL (ref 80.0–100.0)
Monocytes Absolute: 0.8 10*3/uL (ref 0.1–1.0)
Monocytes Relative: 10 %
Neutro Abs: 4.4 10*3/uL (ref 1.7–7.7)
Neutrophils Relative %: 58 %
Platelets: 144 10*3/uL — ABNORMAL LOW (ref 150–400)
RBC: 4.05 MIL/uL (ref 3.87–5.11)
RDW: 13.6 % (ref 11.5–15.5)
WBC: 7.5 10*3/uL (ref 4.0–10.5)
nRBC: 0 % (ref 0.0–0.2)

## 2019-04-06 LAB — CMP (CANCER CENTER ONLY)
ALT: 34 U/L (ref 0–44)
AST: 28 U/L (ref 15–41)
Albumin: 3 g/dL — ABNORMAL LOW (ref 3.5–5.0)
Alkaline Phosphatase: 92 U/L (ref 38–126)
Anion gap: 12 (ref 5–15)
BUN: 10 mg/dL (ref 8–23)
CO2: 21 mmol/L — ABNORMAL LOW (ref 22–32)
Calcium: 9.2 mg/dL (ref 8.9–10.3)
Chloride: 105 mmol/L (ref 98–111)
Creatinine: 0.71 mg/dL (ref 0.44–1.00)
GFR, Est AFR Am: >60 mL/min (ref >60)
GFR, Estimated: >60 mL/min (ref >60)
Glucose, Bld: 221 mg/dL — ABNORMAL HIGH (ref 70–99)
Potassium: 3.8 mmol/L (ref 3.5–5.1)
Sodium: 138 mmol/L (ref 135–145)
Total Bilirubin: 0.6 mg/dL (ref 0.3–1.2)
Total Protein: 6.2 g/dL — ABNORMAL LOW (ref 6.5–8.1)

## 2019-04-06 MED ORDER — DEXAMETHASONE 4 MG PO TABS
20.0000 mg | ORAL_TABLET | Freq: Once | ORAL | Status: AC
  Administered 2019-04-06: 20 mg via ORAL

## 2019-04-06 MED ORDER — ACETAMINOPHEN 325 MG PO TABS
ORAL_TABLET | ORAL | Status: AC
  Filled 2019-04-06: qty 2

## 2019-04-06 MED ORDER — DEXAMETHASONE 4 MG PO TABS
ORAL_TABLET | ORAL | Status: AC
  Filled 2019-04-06: qty 5

## 2019-04-06 MED ORDER — HEPARIN SOD (PORK) LOCK FLUSH 100 UNIT/ML IV SOLN
500.0000 [IU] | Freq: Once | INTRAVENOUS | Status: AC | PRN
  Administered 2019-04-06: 500 [IU]
  Filled 2019-04-06: qty 5

## 2019-04-06 MED ORDER — FAMOTIDINE 20 MG PO TABS
20.0000 mg | ORAL_TABLET | Freq: Once | ORAL | Status: AC
  Administered 2019-04-06: 20 mg via ORAL

## 2019-04-06 MED ORDER — SODIUM CHLORIDE 0.9% FLUSH
10.0000 mL | INTRAVENOUS | Status: DC | PRN
  Administered 2019-04-06: 13:00:00 10 mL via INTRAVENOUS
  Filled 2019-04-06: qty 10

## 2019-04-06 MED ORDER — SODIUM CHLORIDE 0.9% FLUSH
10.0000 mL | INTRAVENOUS | Status: DC | PRN
  Administered 2019-04-06: 16:00:00 10 mL
  Filled 2019-04-06: qty 10

## 2019-04-06 MED ORDER — ACETAMINOPHEN 325 MG PO TABS
650.0000 mg | ORAL_TABLET | Freq: Once | ORAL | Status: AC
  Administered 2019-04-06: 650 mg via ORAL

## 2019-04-06 MED ORDER — PROCHLORPERAZINE MALEATE 10 MG PO TABS
10.0000 mg | ORAL_TABLET | Freq: Once | ORAL | Status: AC
  Administered 2019-04-06: 15:00:00 10 mg via ORAL

## 2019-04-06 MED ORDER — PROCHLORPERAZINE MALEATE 10 MG PO TABS
ORAL_TABLET | ORAL | Status: AC
  Filled 2019-04-06: qty 1

## 2019-04-06 MED ORDER — DIPHENHYDRAMINE HCL 25 MG PO CAPS
ORAL_CAPSULE | ORAL | Status: AC
  Filled 2019-04-06: qty 1

## 2019-04-06 MED ORDER — DIPHENHYDRAMINE HCL 25 MG PO CAPS
25.0000 mg | ORAL_CAPSULE | Freq: Once | ORAL | Status: AC
  Administered 2019-04-06: 25 mg via ORAL

## 2019-04-06 MED ORDER — FAMOTIDINE 20 MG PO TABS
ORAL_TABLET | ORAL | Status: AC
  Filled 2019-04-06: qty 1

## 2019-04-06 MED ORDER — SODIUM CHLORIDE 0.9 % IV SOLN
Freq: Once | INTRAVENOUS | Status: AC
  Administered 2019-04-06: 14:00:00 via INTRAVENOUS
  Filled 2019-04-06: qty 250

## 2019-04-06 MED ORDER — DEXTROSE 5 % IV SOLN
27.0000 mg/m2 | Freq: Once | INTRAVENOUS | Status: AC
  Administered 2019-04-06: 16:00:00 50 mg via INTRAVENOUS
  Filled 2019-04-06: qty 10

## 2019-04-06 NOTE — Telephone Encounter (Signed)
Scheduled appt per 4/23 los. °

## 2019-04-06 NOTE — Patient Instructions (Signed)
Coronavirus (COVID-19) Are you at risk?  Are you at risk for the Coronavirus (COVID-19)?  To be considered HIGH RISK for Coronavirus (COVID-19), you have to meet the following criteria:  . Traveled to [place], [place], [name], [place] or [place]; or in the [place] to [place], [place], [place], or [place]; and have fever, cough, and shortness of breath within the last 2 weeks of travel OR . Been in close contact with a person diagnosed with COVID-19 within the last 2 weeks and have fever, cough, and shortness of breath . IF YOU DO NOT MEET THESE CRITERIA, YOU ARE CONSIDERED LOW RISK FOR COVID-19.  What to do if you are HIGH RISK for COVID-19?  . If you are having a medical emergency, call 911. . Seek medical care right away. Before you go to a doctor's office, urgent care or emergency department, call ahead and tell them about your recent travel, contact with someone diagnosed with COVID-19, and your symptoms. You should receive instructions from your physician's office regarding next steps of care.  . When you arrive at healthcare provider, tell the healthcare staff immediately you have returned from visiting [place], [place], [place], [place] or [name]; or traveled in the [place] to [place], [place], [place], or [place]; in the last two weeks or you have been in close contact with a person diagnosed with COVID-19 in the last 2 weeks.   . Tell the health care staff about your symptoms: fever, cough and shortness of breath. . After you have been seen by a medical provider, you will be either: o Tested for (COVID-19) and discharged home on quarantine except to seek medical care if symptoms worsen, and asked to  - Stay home and avoid contact with others until you get your results (4-5 days)  - Avoid travel on public transportation if possible (such as bus, train, or airplane) or o Sent to the Emergency Department by EMS for evaluation, COVID-19 testing, and possible  admission depending on your condition and test results.  What to do if you are LOW RISK for COVID-19?  Reduce your risk of any infection by using the same precautions used for avoiding the common cold or flu:  . Wash your hands often with soap and warm water for at least 20 seconds.  If soap and water are not readily available, use an alcohol-based hand sanitizer with at least 60% alcohol.  . If coughing or sneezing, cover your mouth and nose by coughing or sneezing into the elbow areas of your shirt or coat, into a tissue or into your sleeve (not your hands). . Avoid shaking hands with others and consider head nods or verbal greetings only. . Avoid touching your eyes, nose, or mouth with unwashed hands.  . Avoid close contact with people who are sick. . Avoid places or events with large numbers of people in one location, like concerts or sporting events. . Carefully consider travel plans you have or are making. . If you are planning any travel outside or inside the [place], visit the CDC's Travelers' Health webpage for the latest health notices. . If you have some symptoms but not all symptoms, continue to monitor at home and seek medical attention if your symptoms worsen. . If you are having a medical emergency, call 911.   ADDITIONAL HEALTHCARE OPTIONS FOR PATIENTS  [facility] / e-Visit: [email or web address]         [facility]: [phone]  Onset   Urgent Care: [facility]: [place] Discharge Instructions for Patients Receiving Chemotherapy  Today you received the following chemotherapy agents Kyprolis  To help prevent nausea and vomiting after your treatment, we encourage you to take your nausea medication as directed.    If you develop nausea and vomiting that is not controlled by your nausea medication, call the clinic.   BELOW ARE  SYMPTOMS THAT SHOULD BE REPORTED IMMEDIATELY:  *FEVER GREATER THAN 100.5 F  *CHILLS WITH OR WITHOUT FEVER  NAUSEA AND VOMITING THAT IS NOT CONTROLLED WITH YOUR NAUSEA MEDICATION  *UNUSUAL SHORTNESS OF BREATH  *UNUSUAL BRUISING OR BLEEDING  TENDERNESS IN MOUTH AND THROAT WITH OR WITHOUT PRESENCE OF ULCERS  *URINARY PROBLEMS  *BOWEL PROBLEMS  UNUSUAL RASH Items with * indicate a potential emergency and should be followed up as soon as possible.  Feel free to call the clinic should you have any questions or concerns. The clinic phone number is [phone].  Please show the [facility] at check-in to the Emergency Department and triage nurse.

## 2019-04-07 ENCOUNTER — Inpatient Hospital Stay: Payer: Medicare Other

## 2019-04-07 ENCOUNTER — Other Ambulatory Visit: Payer: Self-pay

## 2019-04-07 VITALS — BP 132/72 | HR 90 | Temp 98.9°F | Resp 18

## 2019-04-07 DIAGNOSIS — Z5112 Encounter for antineoplastic immunotherapy: Secondary | ICD-10-CM | POA: Diagnosis not present

## 2019-04-07 DIAGNOSIS — C9000 Multiple myeloma not having achieved remission: Secondary | ICD-10-CM

## 2019-04-07 DIAGNOSIS — Z7189 Other specified counseling: Secondary | ICD-10-CM

## 2019-04-07 MED ORDER — SODIUM CHLORIDE 0.9 % IV SOLN
Freq: Once | INTRAVENOUS | Status: AC
  Administered 2019-04-07: 13:00:00 via INTRAVENOUS
  Filled 2019-04-07: qty 250

## 2019-04-07 MED ORDER — DEXAMETHASONE 4 MG PO TABS
ORAL_TABLET | ORAL | Status: AC
  Filled 2019-04-07: qty 5

## 2019-04-07 MED ORDER — ACETAMINOPHEN 325 MG PO TABS
650.0000 mg | ORAL_TABLET | Freq: Once | ORAL | Status: AC
  Administered 2019-04-07: 650 mg via ORAL

## 2019-04-07 MED ORDER — DIPHENHYDRAMINE HCL 25 MG PO CAPS
ORAL_CAPSULE | ORAL | Status: AC
  Filled 2019-04-07: qty 1

## 2019-04-07 MED ORDER — SODIUM CHLORIDE 0.9% FLUSH
10.0000 mL | INTRAVENOUS | Status: DC | PRN
  Administered 2019-04-07: 10 mL
  Filled 2019-04-07: qty 10

## 2019-04-07 MED ORDER — PROCHLORPERAZINE MALEATE 10 MG PO TABS
10.0000 mg | ORAL_TABLET | Freq: Once | ORAL | Status: AC
  Administered 2019-04-07: 13:00:00 10 mg via ORAL

## 2019-04-07 MED ORDER — FAMOTIDINE 20 MG PO TABS
ORAL_TABLET | ORAL | Status: AC
  Filled 2019-04-07: qty 1

## 2019-04-07 MED ORDER — FAMOTIDINE 20 MG PO TABS
20.0000 mg | ORAL_TABLET | Freq: Once | ORAL | Status: AC
  Administered 2019-04-07: 13:00:00 20 mg via ORAL

## 2019-04-07 MED ORDER — HEPARIN SOD (PORK) LOCK FLUSH 100 UNIT/ML IV SOLN
500.0000 [IU] | Freq: Once | INTRAVENOUS | Status: AC | PRN
  Administered 2019-04-07: 500 [IU]
  Filled 2019-04-07: qty 5

## 2019-04-07 MED ORDER — SODIUM CHLORIDE 0.9 % IV SOLN
Freq: Once | INTRAVENOUS | Status: AC
  Administered 2019-04-07: 14:00:00 via INTRAVENOUS
  Filled 2019-04-07: qty 250

## 2019-04-07 MED ORDER — DIPHENHYDRAMINE HCL 25 MG PO TABS
25.0000 mg | ORAL_TABLET | Freq: Once | ORAL | Status: AC
  Administered 2019-04-07: 13:00:00 25 mg via ORAL
  Filled 2019-04-07: qty 1

## 2019-04-07 MED ORDER — DEXTROSE 5 % IV SOLN
27.0000 mg/m2 | Freq: Once | INTRAVENOUS | Status: AC
  Administered 2019-04-07: 50 mg via INTRAVENOUS
  Filled 2019-04-07: qty 15

## 2019-04-07 MED ORDER — DEXAMETHASONE 4 MG PO TABS
20.0000 mg | ORAL_TABLET | Freq: Once | ORAL | Status: AC
  Administered 2019-04-07: 20 mg via ORAL

## 2019-04-07 MED ORDER — ACETAMINOPHEN 325 MG PO TABS
ORAL_TABLET | ORAL | Status: AC
  Filled 2019-04-07: qty 2

## 2019-04-07 MED ORDER — PROCHLORPERAZINE MALEATE 10 MG PO TABS
ORAL_TABLET | ORAL | Status: AC
  Filled 2019-04-07: qty 1

## 2019-04-07 NOTE — Patient Instructions (Signed)
[facility] Discharge Instructions for Patients Receiving Chemotherapy  Today you received the following chemotherapy agents:  Kyprolis  To help prevent nausea and vomiting after your treatment, we encourage you to take your nausea medication as prescribed.   If you develop nausea and vomiting that is not controlled by your nausea medication, call the clinic.   BELOW ARE SYMPTOMS THAT SHOULD BE REPORTED IMMEDIATELY:  *FEVER GREATER THAN 100.5 F  *CHILLS WITH OR WITHOUT FEVER  NAUSEA AND VOMITING THAT IS NOT CONTROLLED WITH YOUR NAUSEA MEDICATION  *UNUSUAL SHORTNESS OF BREATH  *UNUSUAL BRUISING OR BLEEDING  TENDERNESS IN MOUTH AND THROAT WITH OR WITHOUT PRESENCE OF ULCERS  *URINARY PROBLEMS  *BOWEL PROBLEMS  UNUSUAL RASH Items with * indicate a potential emergency and should be followed up as soon as possible.  Feel free to call the clinic should you have any questions or concerns. The clinic phone number is [phone].  Please show the CHEMO ALERT CARD at check-in to the Emergency Department and triage nurse.   

## 2019-04-13 ENCOUNTER — Other Ambulatory Visit: Payer: Self-pay | Admitting: Hematology

## 2019-04-13 ENCOUNTER — Other Ambulatory Visit: Payer: Self-pay | Admitting: *Deleted

## 2019-04-13 ENCOUNTER — Inpatient Hospital Stay: Payer: Medicare Other

## 2019-04-13 ENCOUNTER — Other Ambulatory Visit: Payer: Self-pay

## 2019-04-13 ENCOUNTER — Encounter: Payer: Self-pay | Admitting: Hematology

## 2019-04-13 VITALS — BP 111/70 | HR 98 | Temp 98.4°F | Resp 18

## 2019-04-13 DIAGNOSIS — Z7189 Other specified counseling: Secondary | ICD-10-CM

## 2019-04-13 DIAGNOSIS — C9000 Multiple myeloma not having achieved remission: Secondary | ICD-10-CM

## 2019-04-13 DIAGNOSIS — Z5112 Encounter for antineoplastic immunotherapy: Secondary | ICD-10-CM | POA: Diagnosis not present

## 2019-04-13 LAB — CBC WITH DIFFERENTIAL/PLATELET
Abs Immature Granulocytes: 0.18 10*3/uL — ABNORMAL HIGH (ref 0.00–0.07)
Basophils Absolute: 0.1 10*3/uL (ref 0.0–0.1)
Basophils Relative: 1 %
Eosinophils Absolute: 0.5 10*3/uL (ref 0.0–0.5)
Eosinophils Relative: 7 %
HCT: 38.8 % (ref 36.0–46.0)
Hemoglobin: 12.8 g/dL (ref 12.0–15.0)
Immature Granulocytes: 2 %
Lymphocytes Relative: 22 %
Lymphs Abs: 1.7 10*3/uL (ref 0.7–4.0)
MCH: 31.4 pg (ref 26.0–34.0)
MCHC: 33 g/dL (ref 30.0–36.0)
MCV: 95.1 fL (ref 80.0–100.0)
Monocytes Absolute: 1.2 10*3/uL — ABNORMAL HIGH (ref 0.1–1.0)
Monocytes Relative: 16 %
Neutro Abs: 4.1 10*3/uL (ref 1.7–7.7)
Neutrophils Relative %: 52 %
Platelets: 175 10*3/uL (ref 150–400)
RBC: 4.08 MIL/uL (ref 3.87–5.11)
RDW: 13.7 % (ref 11.5–15.5)
WBC: 7.8 10*3/uL (ref 4.0–10.5)
nRBC: 0 % (ref 0.0–0.2)

## 2019-04-13 LAB — CMP (CANCER CENTER ONLY)
ALT: 27 U/L (ref 0–44)
AST: 18 U/L (ref 15–41)
Albumin: 3.2 g/dL — ABNORMAL LOW (ref 3.5–5.0)
Alkaline Phosphatase: 92 U/L (ref 38–126)
Anion gap: 13 (ref 5–15)
BUN: 13 mg/dL (ref 8–23)
CO2: 22 mmol/L (ref 22–32)
Calcium: 9.3 mg/dL (ref 8.9–10.3)
Chloride: 103 mmol/L (ref 98–111)
Creatinine: 0.77 mg/dL (ref 0.44–1.00)
GFR, Est AFR Am: >60 mL/min (ref >60)
GFR, Estimated: >60 mL/min (ref >60)
Glucose, Bld: 216 mg/dL — ABNORMAL HIGH (ref 70–99)
Potassium: 3.8 mmol/L (ref 3.5–5.1)
Sodium: 138 mmol/L (ref 135–145)
Total Bilirubin: 0.7 mg/dL (ref 0.3–1.2)
Total Protein: 6.3 g/dL — ABNORMAL LOW (ref 6.5–8.1)

## 2019-04-13 MED ORDER — HEPARIN SOD (PORK) LOCK FLUSH 100 UNIT/ML IV SOLN
500.0000 [IU] | Freq: Once | INTRAVENOUS | Status: AC | PRN
  Administered 2019-04-13: 500 [IU]
  Filled 2019-04-13: qty 5

## 2019-04-13 MED ORDER — FAMOTIDINE 20 MG PO TABS
20.0000 mg | ORAL_TABLET | Freq: Once | ORAL | Status: AC
  Administered 2019-04-13: 20 mg via ORAL

## 2019-04-13 MED ORDER — FENTANYL 12 MCG/HR TD PT72: 1.0000 | MEDICATED_PATCH | TRANSDERMAL | 0 refills | Status: DC

## 2019-04-13 MED ORDER — ACETAMINOPHEN 325 MG PO TABS
650.0000 mg | ORAL_TABLET | Freq: Once | ORAL | Status: AC
  Administered 2019-04-13: 650 mg via ORAL

## 2019-04-13 MED ORDER — DIPHENHYDRAMINE HCL 25 MG PO CAPS
ORAL_CAPSULE | ORAL | Status: AC
  Filled 2019-04-13: qty 1

## 2019-04-13 MED ORDER — PROCHLORPERAZINE MALEATE 10 MG PO TABS
ORAL_TABLET | ORAL | Status: AC
  Filled 2019-04-13: qty 1

## 2019-04-13 MED ORDER — DIPHENHYDRAMINE HCL 25 MG PO CAPS
25.0000 mg | ORAL_CAPSULE | Freq: Once | ORAL | Status: AC
  Administered 2019-04-13: 25 mg via ORAL
  Filled 2019-04-13: qty 1

## 2019-04-13 MED ORDER — SODIUM CHLORIDE 0.9% FLUSH
10.0000 mL | Freq: Once | INTRAVENOUS | Status: AC | PRN
  Administered 2019-04-13: 10 mL
  Filled 2019-04-13: qty 10

## 2019-04-13 MED ORDER — DEXTROSE 5 % IV SOLN
27.0000 mg/m2 | Freq: Once | INTRAVENOUS | Status: AC
  Administered 2019-04-13: 50 mg via INTRAVENOUS
  Filled 2019-04-13: qty 15

## 2019-04-13 MED ORDER — DEXAMETHASONE 4 MG PO TABS
20.0000 mg | ORAL_TABLET | Freq: Once | ORAL | Status: AC
  Administered 2019-04-13: 20 mg via ORAL

## 2019-04-13 MED ORDER — SODIUM CHLORIDE 0.9 % IV SOLN
Freq: Once | INTRAVENOUS | Status: AC
  Administered 2019-04-13: 14:00:00 via INTRAVENOUS
  Filled 2019-04-13: qty 250

## 2019-04-13 MED ORDER — SODIUM CHLORIDE 0.9 % IV SOLN
Freq: Once | INTRAVENOUS | Status: DC
  Filled 2019-04-13: qty 250

## 2019-04-13 MED ORDER — DEXAMETHASONE 4 MG PO TABS
ORAL_TABLET | ORAL | Status: AC
  Filled 2019-04-13: qty 5

## 2019-04-13 MED ORDER — SODIUM CHLORIDE 0.9% FLUSH
10.0000 mL | INTRAVENOUS | Status: DC | PRN
  Administered 2019-04-13: 10 mL
  Filled 2019-04-13: qty 10

## 2019-04-13 MED ORDER — OXYCODONE HCL 10 MG PO TABS: 10.0000 mg | ORAL_TABLET | Freq: Four times a day (QID) | ORAL | 0 refills | Status: DC | PRN

## 2019-04-13 MED ORDER — ACETAMINOPHEN 325 MG PO TABS
ORAL_TABLET | ORAL | Status: AC
  Filled 2019-04-13: qty 2

## 2019-04-13 MED ORDER — PROCHLORPERAZINE MALEATE 10 MG PO TABS
10.0000 mg | ORAL_TABLET | Freq: Once | ORAL | Status: AC
  Administered 2019-04-13: 10 mg via ORAL

## 2019-04-13 MED ORDER — FAMOTIDINE 20 MG PO TABS
ORAL_TABLET | ORAL | Status: AC
  Filled 2019-04-13: qty 1

## 2019-04-13 NOTE — Patient Instructions (Signed)
Coronavirus (COVID-19) Are you at risk?  Are you at risk for the Coronavirus (COVID-19)?  To be considered HIGH RISK for Coronavirus (COVID-19), you have to meet the following criteria:  . Traveled to [place], [place], [name], [place] or [place]; or in the [place] to [place], [place], [place], or [place]; and have fever, cough, and shortness of breath within the last 2 weeks of travel OR . Been in close contact with a person diagnosed with COVID-19 within the last 2 weeks and have fever, cough, and shortness of breath . IF YOU DO NOT MEET THESE CRITERIA, YOU ARE CONSIDERED LOW RISK FOR COVID-19.  What to do if you are HIGH RISK for COVID-19?  . If you are having a medical emergency, call 911. . Seek medical care right away. Before you go to a doctor's office, urgent care or emergency department, call ahead and tell them about your recent travel, contact with someone diagnosed with COVID-19, and your symptoms. You should receive instructions from your physician's office regarding next steps of care.  . When you arrive at healthcare provider, tell the healthcare staff immediately you have returned from visiting [place], [place], [place], [place] or [name]; or traveled in the [place] to [place], [place], [place], or [place]; in the last two weeks or you have been in close contact with a person diagnosed with COVID-19 in the last 2 weeks.   . Tell the health care staff about your symptoms: fever, cough and shortness of breath. . After you have been seen by a medical provider, you will be either: o Tested for (COVID-19) and discharged home on quarantine except to seek medical care if symptoms worsen, and asked to  - Stay home and avoid contact with others until you get your results (4-5 days)  - Avoid travel on public transportation if possible (such as bus, train, or airplane) or o Sent to the Emergency Department by EMS for evaluation, COVID-19 testing, and possible  admission depending on your condition and test results.  What to do if you are LOW RISK for COVID-19?  Reduce your risk of any infection by using the same precautions used for avoiding the common cold or flu:  . Wash your hands often with soap and warm water for at least 20 seconds.  If soap and water are not readily available, use an alcohol-based hand sanitizer with at least 60% alcohol.  . If coughing or sneezing, cover your mouth and nose by coughing or sneezing into the elbow areas of your shirt or coat, into a tissue or into your sleeve (not your hands). . Avoid shaking hands with others and consider head nods or verbal greetings only. . Avoid touching your eyes, nose, or mouth with unwashed hands.  . Avoid close contact with people who are sick. . Avoid places or events with large numbers of people in one location, like concerts or sporting events. . Carefully consider travel plans you have or are making. . If you are planning any travel outside or inside the [place], visit the CDC's Travelers' Health webpage for the latest health notices. . If you have some symptoms but not all symptoms, continue to monitor at home and seek medical attention if your symptoms worsen. . If you are having a medical emergency, call 911.   ADDITIONAL HEALTHCARE OPTIONS FOR PATIENTS  [facility] / e-Visit: [email or web address]         [facility]: [phone]  Onset   Urgent Care: [facility]: [place] Discharge Instructions for Patients Receiving Chemotherapy  Today you received the following chemotherapy agents Kyprolis  To help prevent nausea and vomiting after your treatment, we encourage you to take your nausea medication as directed.    If you develop nausea and vomiting that is not controlled by your nausea medication, call the clinic.   BELOW ARE  SYMPTOMS THAT SHOULD BE REPORTED IMMEDIATELY:  *FEVER GREATER THAN 100.5 F  *CHILLS WITH OR WITHOUT FEVER  NAUSEA AND VOMITING THAT IS NOT CONTROLLED WITH YOUR NAUSEA MEDICATION  *UNUSUAL SHORTNESS OF BREATH  *UNUSUAL BRUISING OR BLEEDING  TENDERNESS IN MOUTH AND THROAT WITH OR WITHOUT PRESENCE OF ULCERS  *URINARY PROBLEMS  *BOWEL PROBLEMS  UNUSUAL RASH Items with * indicate a potential emergency and should be followed up as soon as possible.  Feel free to call the clinic should you have any questions or concerns. The clinic phone number is [phone].  Please show the [facility] at check-in to the Emergency Department and triage nurse.

## 2019-04-13 NOTE — Patient Instructions (Signed)

## 2019-04-13 NOTE — Telephone Encounter (Signed)
Patient left message via Mychart: "Was in to see Dr. Irene Limbo last Thursday- he was to send in refill for Oxycodone and Fentanyl patches. [facility] does not have the prescriptions. Please remind Dr. Irene Limbo to call these in. Thank you so much." Refill request routed to MD

## 2019-04-14 ENCOUNTER — Inpatient Hospital Stay: Payer: Medicare Other | Attending: Hematology

## 2019-04-14 ENCOUNTER — Other Ambulatory Visit: Payer: Self-pay

## 2019-04-14 VITALS — BP 127/75 | HR 89 | Temp 97.9°F | Resp 18

## 2019-04-14 DIAGNOSIS — C9000 Multiple myeloma not having achieved remission: Secondary | ICD-10-CM | POA: Diagnosis present

## 2019-04-14 DIAGNOSIS — Z5112 Encounter for antineoplastic immunotherapy: Secondary | ICD-10-CM | POA: Insufficient documentation

## 2019-04-14 DIAGNOSIS — Z7189 Other specified counseling: Secondary | ICD-10-CM

## 2019-04-14 LAB — MULTIPLE MYELOMA PANEL, SERUM
Albumin SerPl Elph-Mcnc: 3.3 g/dL (ref 2.9–4.4)
Albumin/Glob SerPl: 1.3 (ref 0.7–1.7)
Alpha 1: 0.2 g/dL (ref 0.0–0.4)
Alpha2 Glob SerPl Elph-Mcnc: 0.9 g/dL (ref 0.4–1.0)
B-Globulin SerPl Elph-Mcnc: 1 g/dL (ref 0.7–1.3)
Gamma Glob SerPl Elph-Mcnc: 0.6 g/dL (ref 0.4–1.8)
Globulin, Total: 2.7 g/dL (ref 2.2–3.9)
IgA: 193 mg/dL (ref 64–422)
IgG (Immunoglobin G), Serum: 689 mg/dL (ref 586–1602)
IgM (Immunoglobulin M), Srm: 86 mg/dL (ref 26–217)
M Protein SerPl Elph-Mcnc: 0.2 g/dL — ABNORMAL HIGH
Total Protein ELP: 6 g/dL (ref 6.0–8.5)

## 2019-04-14 LAB — KAPPA/LAMBDA LIGHT CHAINS
Kappa free light chain: 18.7 mg/L (ref 3.3–19.4)
Kappa, lambda light chain ratio: 0.93 (ref 0.26–1.65)
Lambda free light chains: 20.2 mg/L (ref 5.7–26.3)

## 2019-04-14 MED ORDER — ACETAMINOPHEN 325 MG PO TABS
650.0000 mg | ORAL_TABLET | Freq: Once | ORAL | Status: AC
  Administered 2019-04-14: 650 mg via ORAL

## 2019-04-14 MED ORDER — DEXAMETHASONE 4 MG PO TABS
20.0000 mg | ORAL_TABLET | Freq: Once | ORAL | Status: AC
  Administered 2019-04-14: 20 mg via ORAL

## 2019-04-14 MED ORDER — HEPARIN SOD (PORK) LOCK FLUSH 100 UNIT/ML IV SOLN
500.0000 [IU] | Freq: Once | INTRAVENOUS | Status: AC | PRN
  Administered 2019-04-14: 500 [IU]
  Filled 2019-04-14: qty 5

## 2019-04-14 MED ORDER — DEXTROSE 5 % IV SOLN
27.0000 mg/m2 | Freq: Once | INTRAVENOUS | Status: AC
  Administered 2019-04-14: 50 mg via INTRAVENOUS
  Filled 2019-04-14: qty 15

## 2019-04-14 MED ORDER — PROCHLORPERAZINE MALEATE 10 MG PO TABS
10.0000 mg | ORAL_TABLET | Freq: Once | ORAL | Status: AC
  Administered 2019-04-14: 14:00:00 10 mg via ORAL
  Filled 2019-04-14: qty 1

## 2019-04-14 MED ORDER — DIPHENHYDRAMINE HCL 25 MG PO CAPS
25.0000 mg | ORAL_CAPSULE | Freq: Once | ORAL | Status: AC
  Administered 2019-04-14: 25 mg via ORAL
  Filled 2019-04-14: qty 1

## 2019-04-14 MED ORDER — ZOLEDRONIC ACID 4 MG/100ML IV SOLN
4.0000 mg | Freq: Once | INTRAVENOUS | Status: AC
  Administered 2019-04-14: 4 mg via INTRAVENOUS
  Filled 2019-04-14: qty 100

## 2019-04-14 MED ORDER — SODIUM CHLORIDE 0.9% FLUSH
10.0000 mL | INTRAVENOUS | Status: DC | PRN
  Administered 2019-04-14: 10 mL
  Filled 2019-04-14: qty 10

## 2019-04-14 MED ORDER — FAMOTIDINE 20 MG PO TABS
20.0000 mg | ORAL_TABLET | Freq: Once | ORAL | Status: AC
  Administered 2019-04-14: 20 mg via ORAL
  Filled 2019-04-14: qty 1

## 2019-04-14 MED ORDER — DEXAMETHASONE 4 MG PO TABS
ORAL_TABLET | ORAL | Status: AC
  Filled 2019-04-14: qty 5

## 2019-04-14 MED ORDER — DIPHENHYDRAMINE HCL 25 MG PO CAPS
ORAL_CAPSULE | ORAL | Status: AC
  Filled 2019-04-14: qty 1

## 2019-04-14 MED ORDER — SODIUM CHLORIDE 0.9 % IV SOLN
Freq: Once | INTRAVENOUS | Status: AC
  Administered 2019-04-14: 13:00:00 via INTRAVENOUS
  Filled 2019-04-14: qty 250

## 2019-04-14 MED ORDER — ACETAMINOPHEN 325 MG PO TABS
ORAL_TABLET | ORAL | Status: AC
  Filled 2019-04-14: qty 2

## 2019-04-14 MED ORDER — SODIUM CHLORIDE 0.9 % IV SOLN
Freq: Once | INTRAVENOUS | Status: AC
  Administered 2019-04-14: 14:00:00 via INTRAVENOUS
  Filled 2019-04-14: qty 250

## 2019-04-14 NOTE — Patient Instructions (Signed)
[facility] Discharge Instructions for Patients Receiving Chemotherapy  Today you received the following chemotherapy agents:  Kyprolis  To help prevent nausea and vomiting after your treatment, we encourage you to take your nausea medication as directed.   If you develop nausea and vomiting that is not controlled by your nausea medication, call the clinic.   BELOW ARE SYMPTOMS THAT SHOULD BE REPORTED IMMEDIATELY:  *FEVER GREATER THAN 100.5 F  *CHILLS WITH OR WITHOUT FEVER  NAUSEA AND VOMITING THAT IS NOT CONTROLLED WITH YOUR NAUSEA MEDICATION  *UNUSUAL SHORTNESS OF BREATH  *UNUSUAL BRUISING OR BLEEDING  TENDERNESS IN MOUTH AND THROAT WITH OR WITHOUT PRESENCE OF ULCERS  *URINARY PROBLEMS  *BOWEL PROBLEMS  UNUSUAL RASH Items with * indicate a potential emergency and should be followed up as soon as possible.  Feel free to call the clinic should you have any questions or concerns. The clinic phone number is [phone].  Please show the CHEMO ALERT CARD at check-in to the Emergency Department and triage nurse.   

## 2019-04-15 ENCOUNTER — Other Ambulatory Visit: Payer: Self-pay | Admitting: Hematology

## 2019-04-26 NOTE — Progress Notes (Signed)
HEMATOLOGY/ONCOLOGY CLINIC NOTE  Date of Service: 04/27/2019  Patient Care Team: Hoyt Koch, MD as PCP - General (Internal Medicine) Lafayette Dragon, MD (Inactive) as Consulting Physician (Gastroenterology) Megan Salon, MD as Consulting Physician (Gynecology) Melrose Nakayama, MD as Consulting Physician (Orthopedic Surgery) Deneise Lever, MD as Consulting Physician (Pulmonary Disease) [name], MD (Ophthalmology)  CHIEF COMPLAINTS/PURPOSE OF CONSULTATION:  myeloma  HISTORY OF PRESENTING ILLNESS:   Faith Orr is a wonderful 78 y.o. female who has been referred to [name] by Dr. Pricilla Holm for evaluation and management of Lytic bone lesions. She is accompanied today by her son in law, and her partner is present via phone. The pt reports that she is doing well overall.   The pt notes that 2-3 months ago while standing at the stove, and otherwise feeling normally, she felt "something snap that took her breath away" in her mid back as she stretched to get something. The pt notes that she saw a chiropractor twice due to her back stiffness, which did not help. The pt then described her new bone pains to her PCP on 12/05/18, and subsequent imaging, as noted below, revealed concerns for numerous bone lesions. She has begun 51m Fosamax. The pt notes that she had hip pain in 2018, and that an XR at that time did not reveal any lesions.  The pt notes that most of her pain is concentrated to her left shoulder presently, and with minimal movement of the arm. She endorses pain radiating into her left arm and notes that her hand is swollen in the mornings when she wakes up. She also endorses present back pain and right hip pain, worse when she walks. She has not yet seen orthopedics. The pt reports that she is needing to take 6057mAdvil every 4-6 hours as Tramadol alone has not been able to alleviate her pain. The pt denies any unexpected weight loss, fevers, chills, or night  sweats. The pt notes that her urine is a very dark color presently, but denies overt blood in the urine, underpants, nor tissue paper. The pt notes that in the last two weeks she has had some soreness in her head, but denies new headaches or changes in vision. The pt notes that she has been urinating more frequently overall, but has been trying to stay better hydrated as well.  The pt notes that she has been compliant with annual mammograms.   The pt notes that she had a cyst in her right breast which was removed in the past. She fractured her left wrist in 2008 after falling down stairs. The pt endorses history of fatty liver.   The pt denies ever smoking cigarettes and endorses significant second hand smoke exposure with a previous marriage.   Of note prior to the patient's visit today, pt has had a Bone Scan completed on 12/13/18 with results revealing Multifocal uptake throughout the skeleton, consistent with diffuse metastatic disease. Primary tumor is not specified. 2. Uptake in the proximal right femur, consistent with lytic lesions. 3. Uptake in the ribs bilaterally as described. 4. Lesions in the proximal left humerus. 5. Diffuse uptake throughout the skull consistent with metastatic disease. 6. Right paramedian uptake at the manubrium.  Most recent lab results (12/08/18) of CBC w/diff and CMP is as follows: all values are WNL except for Glucose at 279, BUN at 24, AST at 41, ALT at 46. 12/08/18 SPEP revealed all values WNL except for Total Protein at 6.0,  Albumin at 3.6, Gamma globulin at 0.7, and M spike at 0.5g  On review of systems, pt reports significant left shoulder pain, back pain, right hip pain, dark urine, and denies fevers, chills, night sweats, unexpected weight loss, changes in bowel habits, changes in breathing, cough, new respiratory symptoms, changes in vision, abdominal pains, leg swelling, and any other symptoms.   On PMHx the pt reports fatty liver, and denies blood clots.    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 Denies ever smoking.  On Family Hx the pt reports maternal grandmother with colon cancer. Father with bladder cancer and amyloidosis (pt notes that this could have been misdiagnosis). Mother with Protein S deficiency and polymyalgia rheumatica.  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 The patient's last visit with [name] was on 04/06/19. The pt reports that she is doing well overall.  The pt reports that she has not developed any new concerns in the interim. She notes that her pain is well controlled overall, and endorses a mild general aching. She takes 2 Oxycodone for break through pain per day. She notes that she has slowly improved her activity levels and is walking around her home more and in her garden, taking careful precautions as she does so. She notes that she is a little tired but denies fatigue. She denies concerns for infections.   The pt notes that she has noticed her hands trembling more. She denies any tingling or numbness in her hands or feet, and denies cramping. She notes that she had some mild tremors in her left hand prior to her myeloma diagnosis. She denies her trembling being very bothersome to her. She notes that these tremors affect the appearance of her writing. She notes that some days are better than others. She notes that her anxiety levels are high, and feels overwhelmed sometimes.  Lab results today (04/27/19) of CBC w/diff and CMP is as follows: all values are WNL except for Monocytes abs at 1.5k, Basophils abs at 200, Abs immature granulocytes at 0.08k, Glucose at 233, Albumin at 3.4. 04/13/19 MMP revealed M protein at 0.2g  On review of systems, pt reports stable energy levels, increased activity, tremors in hands, anxiety, and denies fatigue, new bone pains, concerns for infections, hand  cramping, tingling or numbness in her hands or feet, abdominal pains, leg swelling, and any other symptoms.   MEDICAL HISTORY:  Past Medical History:  Diagnosis Date   Allergy    seasonal   Asthma    DEPRESSION    DIABETES MELLITUS, TYPE II    Diverticulosis    HYPERLIPIDEMIA    Macular degeneration of left eye    mild, Dr.Hecker   Obesity, unspecified    Osteoarthritis of both knees    OSTEOPENIA    Osteopenia    URINARY INCONTINENCE     SURGICAL HISTORY: Past Surgical History:  Procedure Laterality Date   BREAST SURGERY     CATARACT EXTRACTION Left 05/24/2018   CESAREAN SECTION  01/1973   FRACTURE SURGERY     IR IMAGING GUIDED PORT INSERTION  02/20/2019   left wrist surgery  2008   By Dr. Latanya Maudlin   right ankle  1994    SOCIAL HISTORY: Social History   Socioeconomic History   Marital status: Married    Spouse name: Not on file   Number of children: 1  Years of education: Not on file   Highest education level: Not on file  Occupational History    Employer: [employer] resource strain: Patient refused   Food insecurity:    Worry: Patient refused    Inability: Patient refused   Transportation needs:    Medical: Patient refused    Non-medical: Patient refused  Tobacco Use   Smoking status: Never Smoker   Smokeless tobacco: Never Used   Tobacco comment: Lives with partner [name]) and son  Substance and Sexual Activity   Alcohol use: No    Alcohol/week: 0.0 standard drinks   Drug use: No   Sexual activity: Never    Partners: Female    Birth control/protection: Post-menopausal    Comment: Lives with female partner (annette hicks) and 52 yo son  Lifestyle   Physical activity:    Days per week: Patient refused    Minutes per session: Patient refused   Stress: Patient refused  Relationships   Social connections:    Talks on phone: Patient refused    Gets together: Patient  refused    Attends religious service: Patient refused    Active member of club or organization: Patient refused    Attends meetings of clubs or organizations: Patient refused    Relationship status: Patient refused   Intimate partner violence:    Fear of current or ex partner: Patient refused    Emotionally abused: Patient refused    Physically abused: Patient refused    Forced sexual activity: Patient refused  Other Topics Concern   Not on file  Social History Narrative   Not on file    FAMILY HISTORY: Family History  Problem Relation Age of Onset   Diabetes Father    Hyperlipidemia Father    Heart disease Father    Cancer Father    Hypertension Father    Colon cancer Paternal Grandmother 69   Osteoporosis Mother    Protein S deficiency Mother    Hyperlipidemia Mother    Multiple sclerosis Daughter    Cancer Other        bladder   Breast cancer Neg Hx     ALLERGIES:  is allergic to penicillins; aleve [naproxen sodium]; and sulfonamide derivatives.  MEDICATIONS:  Current Outpatient Medications  Medication Sig Dispense Refill   acyclovir (ZOVIRAX) 400 MG tablet Take 1 tablet (400 mg total) by mouth 2 (two) times daily. 60 tablet 3   albuterol (PROVENTIL HFA;VENTOLIN HFA) 108 (90 Base) MCG/ACT inhaler INHALE 1 TO 2 PUFFS INTO THE LUNGS EVERY 6 HOURS AS NEEDED FOR WHEEZING OR SHORTNESS OF BREATH (Patient not taking: No sig reported) 54 g 1   aspirin EC 81 MG tablet Take 81 mg by mouth daily after breakfast.      Blood Glucose Monitoring Suppl (FREESTYLE FREEDOM LITE) W/DEVICE KIT Use to check blood sugars twice a day Dx 250.00 1 each 0   Calcium Carbonate-Vitamin D (CALCIUM 600+D HIGH POTENCY) 600-400 MG-UNIT per tablet Take 1 tablet by mouth 2 (two) times daily.      Cetirizine HCl 10 MG CAPS Take 1 capsule (10 mg total) by mouth daily. 30 capsule 1   Continuous Blood Gluc Sensor (FREESTYLE LIBRE 14 DAY SENSOR) MISC 1 each by Does not apply route  every 14 (fourteen) days. 2 each 11   dexamethasone (DECADRON) 4 MG tablet Take 5 tablets (20 mg total) by mouth once a week. On D22 of each cycle of treatment 20  tablet 5   DOK 100 MG capsule TAKE 2 TABLETS BY MOUTH AT BEDTIME 60 capsule 1   fentaNYL (DURAGESIC) 12 MCG/HR Place 1 patch onto the skin every 3 (three) days. 10 patch 0   fexofenadine (ALLEGRA) 60 MG tablet Take 1 tablet (60 mg total) by mouth 2 (two) times daily. 60 tablet 1   fluticasone (FLONASE) 50 MCG/ACT nasal spray Place 1 spray into both nostrils daily. (Patient taking differently: Place 1 spray into both nostrils daily as needed for allergies or rhinitis. ) 16 g 2   glipiZIDE (GLUCOTROL XL) 5 MG 24 hr tablet TAKE 1 TABLET(5 MG) BY MOUTH DAILY WITH BREAKFAST 90 tablet 0   glucose blood (FREESTYLE LITE) test strip CHECK BLOOD SUGAR TWICE DAILY AS DIRECTED Dx 250.00 180 each 3   Lancets (FREESTYLE) lancets Use twice daily to check sugars. 100 each 11   lenalidomide (REVLIMID) 15 MG capsule Take 1 capsule (15 mg total) by mouth daily. Take for 21 days on, 7 days off, repeat every 28 days. 21 capsule 0   lidocaine-prilocaine (EMLA) cream Apply 1 application topically as needed. Use prior to port access. 30 g 0   metFORMIN (GLUCOPHAGE-XR) 500 MG 24 hr tablet Take 3 tablets (1,500 mg total) by mouth daily with breakfast. (Patient taking differently: Take 500-1,000 mg by mouth See admin instructions. 500 mg every morning, 1,000 mg every night) 270 tablet 3   montelukast (SINGULAIR) 10 MG tablet TAKE 1 TABLET BY MOUTH DAILY AS NEEDED (Patient taking differently: Take 10 mg by mouth at bedtime as needed (allergies). ) 30 tablet 3   Multiple Vitamins-Minerals (ICAPS) CAPS Take 1 capsule by mouth daily after breakfast.      ondansetron (ZOFRAN) 8 MG tablet Take 1 tablet (8 mg total) by mouth 2 (two) times daily as needed (Nausea or vomiting). 30 tablet 1   Oxycodone HCl 10 MG TABS Take 1 tablet (10 mg total) by mouth every 6  (six) hours as needed. 90 tablet 0   pantoprazole (PROTONIX) 20 MG tablet TAKE 1 TABLET(20 MG) BY MOUTH DAILY (Patient not taking: No sig reported) 30 tablet 5   polyethylene glycol (MIRALAX / GLYCOLAX) packet Take 17 g by mouth daily after breakfast.      prochlorperazine (COMPAZINE) 10 MG tablet Take 1 tablet (10 mg total) by mouth every 6 (six) hours as needed (Nausea or vomiting). 30 tablet 1   sertraline (ZOLOFT) 50 MG tablet TAKE 1 TABLET BY MOUTH DAILY (Patient taking differently: Take 50 mg by mouth at bedtime. ) 90 tablet 1   simvastatin (ZOCOR) 20 MG tablet TAKE 1 TABLET(20 MG) BY MOUTH DAILY (Patient taking differently: Take 20 mg by mouth every evening. ) 90 tablet 1   Triamcinolone Acetonide 0.025 % LOTN Apply 1 application topically 3 (three) times daily as needed (rash/itching). 60 mL 2   Vitamin D, Ergocalciferol, (DRISDOL) 1.25 MG (50000 UT) CAPS capsule TAKE 1 CAPSULE BY MOUTH EVERY 7 DAYS 12 capsule 0   No current facility-administered medications for this visit.     REVIEW OF SYSTEMS:    A 10+ POINT REVIEW OF SYSTEMS WAS OBTAINED including neurology, dermatology, psychiatry, cardiac, respiratory, lymph, extremities, GI, GU, Musculoskeletal, constitutional, breasts, reproductive, HEENT.  All pertinent positives are noted in the HPI.  All others are negative.   PHYSICAL EXAMINATION: ECOG PERFORMANCE STATUS: 2 - Symptomatic, <50% confined to bed  Vitals:   04/27/19 1206  BP: 139/74  Pulse: 100  Resp: 18  Temp: 98.4 F (  36.9 C)  SpO2: 97%   Filed Weights   04/27/19 1206  Weight: 170 lb 12.8 oz (77.5 kg)   .Body mass index is 33.64 kg/m.  GENERAL:alert, in no acute distress and comfortable SKIN: no acute rashes, no significant lesions EYES: conjunctiva are pink and non-injected, sclera anicteric OROPHARYNX: MMM, no exudates, no oropharyngeal erythema or ulceration NECK: supple, no JVD LYMPH:  no palpable lymphadenopathy in the cervical, axillary or  inguinal regions LUNGS: clear to auscultation b/l with normal respiratory effort HEART: regular rate & rhythm ABDOMEN:  normoactive bowel sounds , non tender, not distended. No palpable hepatosplenomegaly.  Extremity: no pedal edema PSYCH: alert & oriented x 3 with fluent speech NEURO: no focal motor/sensory deficits   LABORATORY DATA:  I have reviewed the data as listed  . CBC Latest Ref Rng & Units 04/27/2019 04/13/2019 04/06/2019  WBC 4.0 - 10.5 K/uL 8.4 7.8 7.5  Hemoglobin 12.0 - 15.0 g/dL 12.9 12.8 12.5  Hematocrit 36.0 - 46.0 % 39.8 38.8 38.1  Platelets 150 - 400 K/uL 288 175 144(L)   . CBC    Component Value Date/Time   WBC 8.4 04/27/2019 1132   RBC 4.07 04/27/2019 1132   HGB 12.9 04/27/2019 1132   HGB 12.8 02/16/2019 1138   HCT 39.8 04/27/2019 1132   PLT 288 04/27/2019 1132   PLT 170 02/16/2019 1138   MCV 97.8 04/27/2019 1132   MCH 31.7 04/27/2019 1132   MCHC 32.4 04/27/2019 1132   RDW 13.8 04/27/2019 1132   LYMPHSABS 1.3 04/27/2019 1132   MONOABS 1.5 (H) 04/27/2019 1132   EOSABS 0.5 04/27/2019 1132   BASOSABS 0.2 (H) 04/27/2019 1132    . CMP Latest Ref Rng & Units 04/27/2019 04/13/2019 04/06/2019  Glucose 70 - 99 mg/dL 233(H) 216(H) 221(H)  BUN 8 - 23 mg/dL '11 13 10  ' Creatinine 0.44 - 1.00 mg/dL 0.77 0.77 0.71  Sodium 135 - 145 mmol/L 139 138 138  Potassium 3.5 - 5.1 mmol/L 3.8 3.8 3.8  Chloride 98 - 111 mmol/L 105 103 105  CO2 22 - 32 mmol/L 22 22 21(L)  Calcium 8.9 - 10.3 mg/dL 9.7 9.3 9.2  Total Protein 6.5 - 8.1 g/dL 6.7 6.3(L) 6.2(L)  Total Bilirubin 0.3 - 1.2 mg/dL 0.7 0.7 0.6  Alkaline Phos 38 - 126 U/L 90 92 92  AST 15 - 41 U/L '21 18 28  ' ALT 0 - 44 U/L 24 27 34   Component     Latest Ref Rng & Units 12/27/2018  IgG (Immunoglobin G), Serum     700 - 1,600 mg/dL 801  IgA     64 - 422 mg/dL 91  IgM (Immunoglobulin M), Srm     26 - 217 mg/dL 15 (L)  Total Protein ELP     6.0 - 8.5 g/dL 6.6  Albumin SerPl Elph-Mcnc     2.9 - 4.4 g/dL 3.7  Alpha  1     0.0 - 0.4 g/dL 0.2  Alpha2 Glob SerPl Elph-Mcnc     0.4 - 1.0 g/dL 0.8  B-Globulin SerPl Elph-Mcnc     0.7 - 1.3 g/dL 1.2  Gamma Glob SerPl Elph-Mcnc     0.4 - 1.8 g/dL 0.7  M Protein SerPl Elph-Mcnc     Not Observed g/dL 0.5 (H)  Globulin, Total     2.2 - 3.9 g/dL 2.9  Albumin/Glob SerPl     0.7 - 1.7 1.3  IFE 1      Comment  Please Note (  HCV):      Comment  Kappa free light chain     3.3 - 19.4 mg/L 5.1  Lamda free light chains     5.7 - 26.3 mg/L 40.3 (H)  Kappa, lamda light chain ratio     0.26 - 1.65 0.13 (L)  Beta-2 Microglobulin     0.6 - 2.4 mg/L 1.7  LDH     98 - 192 U/L 162  Sed Rate     0 - 22 mm/hr 8       01/06/19 Cytogenetics:       RADIOGRAPHIC STUDIES: I have personally reviewed the radiological images as listed and agreed with the findings in the report. No results found.  ASSESSMENT & PLAN:   78 y.o. female with  1. Recently diagnosed Multiple Myeloma, RISS Stage III  Labs upon initial presentation from 12/08/18, blood counts are normal including WBC at 7.1k, HGB at 13.1, and PLT at 245k. Calcium normal at 10.3. Creatinine normal at 0.63. M spike at 0.5g. 12/13/18 Bone Scan revealed Multifocal uptake throughout the skeleton, consistent with diffuse metastatic disease. Primary tumor is not specified. 2. Uptake in the proximal right femur, consistent with lytic lesions. 3. Uptake in the ribs bilaterally as described. 4. Lesions in the proximal left humerus. 5. Diffuse uptake throughout the skull consistent with metastatic disease. 6. Right paramedian uptake at the manubrium.  12/13/18 CT Right Femur revealed Numerous lytic lesions involving the right femur and a lytic lesion in the left inferior pubic ramus. Overall appearance is most concerning for multiple myeloma  12/27/18 Pretreatment 24hour UPEP observed an M spike at 18m, and showed 1932mtotal protein/day.  12/27/18 Pretreatment MMP revealed M Protein at 0.5g with IgG Lambda  specificity. Kappa:Lambda light chain ratio at 0.13, with Lambda at 40.3. There is less abnormal protein and light chains than I would expect from 30% plasma cells, which suggests hypo-secretory or non-secretory neoplastic plasma cells. Will have an impact in assessing response. 01/05/19 PET/CT revealed Innumerable lytic lesions in the skeleton compatible with myeloma. Most of the larger lesions are hypermetabolic, for example including a left proximal humeral shaft lesion with maximum SUV of 8.1 and a 2.8 cm lesion in the left T9 vertebral body with maximum SUV 5.1. Most of the smaller lytic lesions, and some of the larger lesions, do not demonstrate accentuated metabolic activity. 2. 1.2 cm in short axis lymph node in the left parapharyngeal space is hypermetabolic with maximum SUV 11.8. I do not see a separate mass in the head and neck to give rise to this hypermetabolic lymph node. 3. Mosaic attenuation in the lower lobes, nonspecific possibly from air trapping. 4.  Aortic Atherosclerosis 5. Heterogeneous activity in the liver, making it hard to exclude small liver lesions. Consider hepatic protocol MRI with and without contrast for definitive assessment. Nonobstructive right nephrolithiasis. Old granulomatous disease  01/06/19 Bone Marrow biopsy revealed interstitial increase in plasma cells (28% aspirate, 40% CD138 immunohistochemistry). Plasma cells negative for light chains consistent with a non or weakly secretory myeloma   01/06/19 Cytogenetics revealed 37% of cells with trisomy 11 or 11q deletion, and 40.5% of cells with 17p mutation  2. 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  01/17/19 MRI Liver revealed Several appreciable liver lesions all have benign imaging characteristics. No MRI findings of metastatic involvement of the liver. 2. Scattered bony lesions corresponding to the lytic lesions seen at  PET-CT, compatible with  active myeloma. 3. Aortic Atherosclerosis.  Mild cardiomegaly. 4. Diffuse hepatic steatosis.   PLAN -Discussed pt labwork today, 04/27/19; blood counts are stable -Last available MMP from 04/13/19 revealed normalized immunoglobulins and M Protein at 0.2g. Normalized serum free light chains as well. -The pt has no prohibitive toxicities from continuing C4D1 Carfilzomib, Revlimid and Dexamethasone at this time. -Discussed again that the pt appears to have a hypo-secretory presentation, and thus these labs results are indicative of a response, but the most accurate measure of her response will require a repeat BM Biopsy after completing 6 cycles  -Will repeat BM Bx and PET/C after completing 6 cycles -Discussed the options after completing induction treatment including maintenance treatment until progression vs an autologous stem cell transplant followed by maintenance treatment.  -Offered to refer the pt to Dr. Blinda Leatherwood at [facility] for discussion and consideration of auto stem cell transplant. Pt would like to consider the notion of a referral further in the interim -Offered to refer pt to neurologist concerning her tremors. She would not like to do this.  -Will order low dose of Ativan. Advised against using this to sleep or before driving during first use. Discussed potential addictive properties and avoiding excessive use. -Continue 25m Claritin or Zyrtec and 280mPepcid for mild rashes, will continue to watch these -Adjusted premedications to add Tylenol and will split dose of Dexamethasone with pre-meds and pt will not take Decadron at home except on D22 -Okay to take Tylenol at home if feeling flushed, however pt will seek medical attention if fevers rise above 100.4 or repeat in frequency -Will watch lymph node in left parapharyngeal space seen on PET/CT -Pt decided to hold off on prophylactic interventions to mitigate risk of pathologic fractures [facility], [facility], pushing with left upper extremity  -Avoid high impact activities and stairs to preserve right hip as well -May be a role for local radiation therapy for pain control at T9 and left shoulder -Continue Fentanyl patch and 1054mxycodone for break through pain -Continue Zofran for opiate-related nausea -Continue Miralax once a day, and Senna S two tablets BID, back off as constipation resolves -Continue Zometa every 4 weeks -Continue Acyclovir and 71m57mpirin to mitigate risk of Shingles and blood clots respectively -Continue follow up with PCP Dr. ElizPricilla Holm management and evaluation of her DM, especially while on treatment with steroids as blood sugars will be higher -Will see the pt back in 4 weeks   -F/u for current C4 and scheduled C5 of treatment(KRd) as currently scheduled -Continue Zometa q4weeks   All of the patients questions were answered with apparent satisfaction. The patient knows to call the clinic with any problems, questions or concerns.  The total time spent in the appt was 25 minutes and more than 50% was on counseling and direct patient cares.    GautSullivan LoneMS AAHIVMS [facility] Healthatology/Oncology Physician [facility]):       [phone] cell):  [phone]           [phone] [id number]/2020 12:50 PM  I, SchuBaldwin [place] acting as a scribe for Dr. GautSullivan Lone.I have reviewed the above documentation for accuracy and completeness, and I agree with the above. .GauBrunetta Genera

## 2019-04-27 ENCOUNTER — Other Ambulatory Visit: Payer: Self-pay

## 2019-04-27 ENCOUNTER — Inpatient Hospital Stay (HOSPITAL_BASED_OUTPATIENT_CLINIC_OR_DEPARTMENT_OTHER): Payer: Medicare Other | Admitting: Hematology

## 2019-04-27 ENCOUNTER — Inpatient Hospital Stay: Payer: Medicare Other

## 2019-04-27 ENCOUNTER — Telehealth: Payer: Self-pay | Admitting: Hematology

## 2019-04-27 VITALS — BP 139/74 | HR 100 | Temp 98.4°F | Resp 18 | Ht 59.75 in | Wt 170.8 lb

## 2019-04-27 DIAGNOSIS — C9000 Multiple myeloma not having achieved remission: Secondary | ICD-10-CM

## 2019-04-27 DIAGNOSIS — Z79899 Other long term (current) drug therapy: Secondary | ICD-10-CM | POA: Diagnosis not present

## 2019-04-27 DIAGNOSIS — Z8052 Family history of malignant neoplasm of bladder: Secondary | ICD-10-CM | POA: Diagnosis not present

## 2019-04-27 DIAGNOSIS — Z5112 Encounter for antineoplastic immunotherapy: Secondary | ICD-10-CM | POA: Diagnosis not present

## 2019-04-27 DIAGNOSIS — Z7189 Other specified counseling: Secondary | ICD-10-CM

## 2019-04-27 DIAGNOSIS — Z809 Family history of malignant neoplasm, unspecified: Secondary | ICD-10-CM

## 2019-04-27 DIAGNOSIS — Z800 Family history of malignant neoplasm of digestive organs: Secondary | ICD-10-CM

## 2019-04-27 DIAGNOSIS — Z95828 Presence of other vascular implants and grafts: Secondary | ICD-10-CM

## 2019-04-27 DIAGNOSIS — Z5111 Encounter for antineoplastic chemotherapy: Secondary | ICD-10-CM

## 2019-04-27 DIAGNOSIS — C7951 Secondary malignant neoplasm of bone: Secondary | ICD-10-CM

## 2019-04-27 LAB — CMP (CANCER CENTER ONLY)
ALT: 24 U/L (ref 0–44)
AST: 21 U/L (ref 15–41)
Albumin: 3.4 g/dL — ABNORMAL LOW (ref 3.5–5.0)
Alkaline Phosphatase: 90 U/L (ref 38–126)
Anion gap: 12 (ref 5–15)
BUN: 11 mg/dL (ref 8–23)
CO2: 22 mmol/L (ref 22–32)
Calcium: 9.7 mg/dL (ref 8.9–10.3)
Chloride: 105 mmol/L (ref 98–111)
Creatinine: 0.77 mg/dL (ref 0.44–1.00)
GFR, Est AFR Am: >60 mL/min (ref >60)
GFR, Estimated: >60 mL/min (ref >60)
Glucose, Bld: 233 mg/dL — ABNORMAL HIGH (ref 70–99)
Potassium: 3.8 mmol/L (ref 3.5–5.1)
Sodium: 139 mmol/L (ref 135–145)
Total Bilirubin: 0.7 mg/dL (ref 0.3–1.2)
Total Protein: 6.7 g/dL (ref 6.5–8.1)

## 2019-04-27 LAB — CBC WITH DIFFERENTIAL/PLATELET
Abs Immature Granulocytes: 0.08 10*3/uL — ABNORMAL HIGH (ref 0.00–0.07)
Basophils Absolute: 0.2 10*3/uL — ABNORMAL HIGH (ref 0.0–0.1)
Basophils Relative: 2 %
Eosinophils Absolute: 0.5 10*3/uL (ref 0.0–0.5)
Eosinophils Relative: 6 %
HCT: 39.8 % (ref 36.0–46.0)
Hemoglobin: 12.9 g/dL (ref 12.0–15.0)
Immature Granulocytes: 1 %
Lymphocytes Relative: 16 %
Lymphs Abs: 1.3 10*3/uL (ref 0.7–4.0)
MCH: 31.7 pg (ref 26.0–34.0)
MCHC: 32.4 g/dL (ref 30.0–36.0)
MCV: 97.8 fL (ref 80.0–100.0)
Monocytes Absolute: 1.5 10*3/uL — ABNORMAL HIGH (ref 0.1–1.0)
Monocytes Relative: 17 %
Neutro Abs: 4.9 10*3/uL (ref 1.7–7.7)
Neutrophils Relative %: 58 %
Platelets: 288 10*3/uL (ref 150–400)
RBC: 4.07 MIL/uL (ref 3.87–5.11)
RDW: 13.8 % (ref 11.5–15.5)
WBC: 8.4 10*3/uL (ref 4.0–10.5)
nRBC: 0 % (ref 0.0–0.2)

## 2019-04-27 MED ORDER — LORAZEPAM 0.5 MG PO TABS: 0.5000 mg | ORAL_TABLET | Freq: Three times a day (TID) | ORAL | 0 refills | Status: DC | PRN

## 2019-04-27 MED ORDER — SODIUM CHLORIDE 0.9% FLUSH
10.0000 mL | INTRAVENOUS | Status: DC | PRN
  Administered 2019-04-27: 10 mL via INTRAVENOUS
  Filled 2019-04-27: qty 10

## 2019-04-27 MED ORDER — SODIUM CHLORIDE 0.9 % IV SOLN
Freq: Once | INTRAVENOUS | Status: AC
  Administered 2019-04-27: 13:00:00 via INTRAVENOUS
  Filled 2019-04-27: qty 250

## 2019-04-27 MED ORDER — PROCHLORPERAZINE MALEATE 10 MG PO TABS
ORAL_TABLET | ORAL | Status: AC
  Filled 2019-04-27: qty 1

## 2019-04-27 MED ORDER — DIPHENHYDRAMINE HCL 25 MG PO TABS
25.0000 mg | ORAL_TABLET | Freq: Once | ORAL | Status: AC
  Administered 2019-04-27: 13:00:00 25 mg via ORAL
  Filled 2019-04-27: qty 1

## 2019-04-27 MED ORDER — PROCHLORPERAZINE MALEATE 10 MG PO TABS
10.0000 mg | ORAL_TABLET | Freq: Once | ORAL | Status: AC
  Administered 2019-04-27: 13:00:00 10 mg via ORAL

## 2019-04-27 MED ORDER — DEXAMETHASONE 4 MG PO TABS
20.0000 mg | ORAL_TABLET | Freq: Once | ORAL | Status: AC
  Administered 2019-04-27: 13:00:00 20 mg via ORAL

## 2019-04-27 MED ORDER — FAMOTIDINE 20 MG PO TABS
20.0000 mg | ORAL_TABLET | Freq: Once | ORAL | Status: AC
  Administered 2019-04-27: 13:00:00 20 mg via ORAL

## 2019-04-27 MED ORDER — DEXAMETHASONE 4 MG PO TABS
ORAL_TABLET | ORAL | Status: AC
  Filled 2019-04-27: qty 5

## 2019-04-27 MED ORDER — ACETAMINOPHEN 325 MG PO TABS
ORAL_TABLET | ORAL | Status: AC
  Filled 2019-04-27: qty 2

## 2019-04-27 MED ORDER — HEPARIN SOD (PORK) LOCK FLUSH 100 UNIT/ML IV SOLN
500.0000 [IU] | Freq: Once | INTRAVENOUS | Status: AC | PRN
  Administered 2019-04-27: 15:00:00 500 [IU]
  Filled 2019-04-27: qty 5

## 2019-04-27 MED ORDER — FAMOTIDINE 20 MG PO TABS
ORAL_TABLET | ORAL | Status: AC
  Filled 2019-04-27: qty 1

## 2019-04-27 MED ORDER — ACETAMINOPHEN 325 MG PO TABS
650.0000 mg | ORAL_TABLET | Freq: Once | ORAL | Status: AC
  Administered 2019-04-27: 650 mg via ORAL

## 2019-04-27 MED ORDER — DEXTROSE 5 % IV SOLN
27.0000 mg/m2 | Freq: Once | INTRAVENOUS | Status: AC
  Administered 2019-04-27: 14:00:00 50 mg via INTRAVENOUS
  Filled 2019-04-27: qty 10

## 2019-04-27 MED ORDER — SODIUM CHLORIDE 0.9% FLUSH
10.0000 mL | INTRAVENOUS | Status: DC | PRN
  Administered 2019-04-27: 15:00:00 10 mL
  Filled 2019-04-27: qty 10

## 2019-04-27 MED ORDER — DIPHENHYDRAMINE HCL 25 MG PO CAPS
ORAL_CAPSULE | ORAL | Status: AC
  Filled 2019-04-27: qty 1

## 2019-04-27 NOTE — Telephone Encounter (Signed)
Per 5/14 los, appt already scheduled.

## 2019-04-27 NOTE — Patient Instructions (Signed)
[facility] Discharge Instructions for Patients Receiving Chemotherapy  Today you received the following chemotherapy agents kyprolis  To help prevent nausea and vomiting after your treatment, we encourage you to take your nausea medication as directed.   If you develop nausea and vomiting that is not controlled by your nausea medication, call the clinic.   BELOW ARE SYMPTOMS THAT SHOULD BE REPORTED IMMEDIATELY:  *FEVER GREATER THAN 100.5 F  *CHILLS WITH OR WITHOUT FEVER  NAUSEA AND VOMITING THAT IS NOT CONTROLLED WITH YOUR NAUSEA MEDICATION  *UNUSUAL SHORTNESS OF BREATH  *UNUSUAL BRUISING OR BLEEDING  TENDERNESS IN MOUTH AND THROAT WITH OR WITHOUT PRESENCE OF ULCERS  *URINARY PROBLEMS  *BOWEL PROBLEMS  UNUSUAL RASH Items with * indicate a potential emergency and should be followed up as soon as possible.  Feel free to call the clinic should you have any questions or concerns. The clinic phone number is [phone].  Please show the CHEMO ALERT CARD at check-in to the Emergency Department and triage nurse.   

## 2019-04-28 ENCOUNTER — Other Ambulatory Visit: Payer: Self-pay

## 2019-04-28 ENCOUNTER — Inpatient Hospital Stay: Payer: Medicare Other

## 2019-04-28 VITALS — BP 133/77 | HR 92 | Temp 98.5°F | Resp 20

## 2019-04-28 DIAGNOSIS — C9000 Multiple myeloma not having achieved remission: Secondary | ICD-10-CM

## 2019-04-28 DIAGNOSIS — Z5112 Encounter for antineoplastic immunotherapy: Secondary | ICD-10-CM | POA: Diagnosis not present

## 2019-04-28 DIAGNOSIS — Z7189 Other specified counseling: Secondary | ICD-10-CM

## 2019-04-28 MED ORDER — SODIUM CHLORIDE 0.9 % IV SOLN
Freq: Once | INTRAVENOUS | Status: AC
  Administered 2019-04-28: 13:00:00 via INTRAVENOUS
  Filled 2019-04-28: qty 250

## 2019-04-28 MED ORDER — DIPHENHYDRAMINE HCL 25 MG PO TABS
25.0000 mg | ORAL_TABLET | Freq: Once | ORAL | Status: AC
  Administered 2019-04-28: 25 mg via ORAL
  Filled 2019-04-28: qty 1

## 2019-04-28 MED ORDER — DIPHENHYDRAMINE HCL 25 MG PO CAPS
ORAL_CAPSULE | ORAL | Status: AC
  Filled 2019-04-28: qty 1

## 2019-04-28 MED ORDER — SODIUM CHLORIDE 0.9 % IV SOLN
Freq: Once | INTRAVENOUS | Status: DC
  Filled 2019-04-28: qty 250

## 2019-04-28 MED ORDER — FAMOTIDINE 20 MG PO TABS
ORAL_TABLET | ORAL | Status: AC
  Filled 2019-04-28: qty 1

## 2019-04-28 MED ORDER — DEXTROSE 5 % IV SOLN
27.0000 mg/m2 | Freq: Once | INTRAVENOUS | Status: AC
  Administered 2019-04-28: 50 mg via INTRAVENOUS
  Filled 2019-04-28: qty 15

## 2019-04-28 MED ORDER — PROCHLORPERAZINE MALEATE 10 MG PO TABS
ORAL_TABLET | ORAL | Status: AC
  Filled 2019-04-28: qty 1

## 2019-04-28 MED ORDER — FAMOTIDINE 20 MG PO TABS
20.0000 mg | ORAL_TABLET | Freq: Once | ORAL | Status: AC
  Administered 2019-04-28: 20 mg via ORAL

## 2019-04-28 MED ORDER — PROCHLORPERAZINE MALEATE 10 MG PO TABS
10.0000 mg | ORAL_TABLET | Freq: Once | ORAL | Status: AC
  Administered 2019-04-28: 10 mg via ORAL

## 2019-04-28 MED ORDER — HEPARIN SOD (PORK) LOCK FLUSH 100 UNIT/ML IV SOLN
500.0000 [IU] | Freq: Once | INTRAVENOUS | Status: AC | PRN
  Administered 2019-04-28: 500 [IU]
  Filled 2019-04-28: qty 5

## 2019-04-28 MED ORDER — DEXAMETHASONE 4 MG PO TABS
20.0000 mg | ORAL_TABLET | Freq: Once | ORAL | Status: AC
  Administered 2019-04-28: 13:00:00 20 mg via ORAL

## 2019-04-28 MED ORDER — DEXAMETHASONE 4 MG PO TABS
ORAL_TABLET | ORAL | Status: AC
  Filled 2019-04-28: qty 5

## 2019-04-28 MED ORDER — ACETAMINOPHEN 325 MG PO TABS
ORAL_TABLET | ORAL | Status: AC
  Filled 2019-04-28: qty 2

## 2019-04-28 MED ORDER — FAMOTIDINE IN NACL 20-0.9 MG/50ML-% IV SOLN
INTRAVENOUS | Status: AC
  Filled 2019-04-28: qty 50

## 2019-04-28 MED ORDER — ACETAMINOPHEN 325 MG PO TABS
650.0000 mg | ORAL_TABLET | Freq: Once | ORAL | Status: AC
  Administered 2019-04-28: 650 mg via ORAL

## 2019-04-28 MED ORDER — SODIUM CHLORIDE 0.9% FLUSH
10.0000 mL | INTRAVENOUS | Status: DC | PRN
  Administered 2019-04-28: 14:00:00 10 mL
  Filled 2019-04-28: qty 10

## 2019-04-28 NOTE — Patient Instructions (Signed)
[facility] Discharge Instructions for Patients Receiving Chemotherapy  Today you received the following chemotherapy agents kyprolis  To help prevent nausea and vomiting after your treatment, we encourage you to take your nausea medication as directed.   If you develop nausea and vomiting that is not controlled by your nausea medication, call the clinic.   BELOW ARE SYMPTOMS THAT SHOULD BE REPORTED IMMEDIATELY:  *FEVER GREATER THAN 100.5 F  *CHILLS WITH OR WITHOUT FEVER  NAUSEA AND VOMITING THAT IS NOT CONTROLLED WITH YOUR NAUSEA MEDICATION  *UNUSUAL SHORTNESS OF BREATH  *UNUSUAL BRUISING OR BLEEDING  TENDERNESS IN MOUTH AND THROAT WITH OR WITHOUT PRESENCE OF ULCERS  *URINARY PROBLEMS  *BOWEL PROBLEMS  UNUSUAL RASH Items with * indicate a potential emergency and should be followed up as soon as possible.  Feel free to call the clinic should you have any questions or concerns. The clinic phone number is [phone].  Please show the CHEMO ALERT CARD at check-in to the Emergency Department and triage nurse.   

## 2019-04-30 ENCOUNTER — Other Ambulatory Visit: Payer: Self-pay | Admitting: Internal Medicine

## 2019-05-02 ENCOUNTER — Other Ambulatory Visit: Payer: Self-pay | Admitting: *Deleted

## 2019-05-02 DIAGNOSIS — C9000 Multiple myeloma not having achieved remission: Secondary | ICD-10-CM

## 2019-05-02 MED ORDER — LENALIDOMIDE 15 MG PO CAPS: 15.0000 mg | ORAL_CAPSULE | Freq: Every day | ORAL | 0 refills | Status: DC

## 2019-05-02 NOTE — Telephone Encounter (Signed)
Revlimid refill per OV note 04/27/2019. Refill sent to [facility]) [place], [place] # [id number], 05/02/2019

## 2019-05-04 ENCOUNTER — Inpatient Hospital Stay: Payer: Medicare Other

## 2019-05-04 ENCOUNTER — Other Ambulatory Visit: Payer: Self-pay

## 2019-05-04 VITALS — BP 140/74 | HR 99 | Temp 98.9°F | Resp 20

## 2019-05-04 DIAGNOSIS — Z7189 Other specified counseling: Secondary | ICD-10-CM

## 2019-05-04 DIAGNOSIS — Z95828 Presence of other vascular implants and grafts: Secondary | ICD-10-CM | POA: Insufficient documentation

## 2019-05-04 DIAGNOSIS — C9000 Multiple myeloma not having achieved remission: Secondary | ICD-10-CM

## 2019-05-04 DIAGNOSIS — Z5112 Encounter for antineoplastic immunotherapy: Secondary | ICD-10-CM | POA: Diagnosis not present

## 2019-05-04 LAB — CMP (CANCER CENTER ONLY)
ALT: 27 U/L (ref 0–44)
AST: 19 U/L (ref 15–41)
Albumin: 3.4 g/dL — ABNORMAL LOW (ref 3.5–5.0)
Alkaline Phosphatase: 85 U/L (ref 38–126)
Anion gap: 12 (ref 5–15)
BUN: 15 mg/dL (ref 8–23)
CO2: 22 mmol/L (ref 22–32)
Calcium: 9.8 mg/dL (ref 8.9–10.3)
Chloride: 104 mmol/L (ref 98–111)
Creatinine: 0.75 mg/dL (ref 0.44–1.00)
GFR, Est AFR Am: >60 mL/min (ref >60)
GFR, Estimated: >60 mL/min (ref >60)
Glucose, Bld: 215 mg/dL — ABNORMAL HIGH (ref 70–99)
Potassium: 3.5 mmol/L (ref 3.5–5.1)
Sodium: 138 mmol/L (ref 135–145)
Total Bilirubin: 0.8 mg/dL (ref 0.3–1.2)
Total Protein: 6.5 g/dL (ref 6.5–8.1)

## 2019-05-04 LAB — CBC WITH DIFFERENTIAL/PLATELET
Abs Immature Granulocytes: 0.14 10*3/uL — ABNORMAL HIGH (ref 0.00–0.07)
Basophils Absolute: 0.1 10*3/uL (ref 0.0–0.1)
Basophils Relative: 1 %
Eosinophils Absolute: 0.4 10*3/uL (ref 0.0–0.5)
Eosinophils Relative: 5 %
HCT: 40.2 % (ref 36.0–46.0)
Hemoglobin: 13.1 g/dL (ref 12.0–15.0)
Immature Granulocytes: 2 %
Lymphocytes Relative: 18 %
Lymphs Abs: 1.4 10*3/uL (ref 0.7–4.0)
MCH: 31.6 pg (ref 26.0–34.0)
MCHC: 32.6 g/dL (ref 30.0–36.0)
MCV: 96.9 fL (ref 80.0–100.0)
Monocytes Absolute: 1.1 10*3/uL — ABNORMAL HIGH (ref 0.1–1.0)
Monocytes Relative: 14 %
Neutro Abs: 4.7 10*3/uL (ref 1.7–7.7)
Neutrophils Relative %: 60 %
Platelets: 156 10*3/uL (ref 150–400)
RBC: 4.15 MIL/uL (ref 3.87–5.11)
RDW: 13.3 % (ref 11.5–15.5)
WBC: 7.8 10*3/uL (ref 4.0–10.5)
nRBC: 0 % (ref 0.0–0.2)

## 2019-05-04 MED ORDER — SODIUM CHLORIDE 0.9% FLUSH
10.0000 mL | Freq: Once | INTRAVENOUS | Status: AC
  Administered 2019-05-04: 10 mL
  Filled 2019-05-04: qty 10

## 2019-05-04 MED ORDER — HEPARIN SOD (PORK) LOCK FLUSH 100 UNIT/ML IV SOLN
500.0000 [IU] | Freq: Once | INTRAVENOUS | Status: AC | PRN
  Administered 2019-05-04: 14:00:00 500 [IU]
  Filled 2019-05-04: qty 5

## 2019-05-04 MED ORDER — FAMOTIDINE 20 MG PO TABS
ORAL_TABLET | ORAL | Status: AC
  Filled 2019-05-04: qty 1

## 2019-05-04 MED ORDER — FAMOTIDINE 20 MG PO TABS
20.0000 mg | ORAL_TABLET | Freq: Once | ORAL | Status: AC
  Administered 2019-05-04: 13:00:00 20 mg via ORAL

## 2019-05-04 MED ORDER — DIPHENHYDRAMINE HCL 25 MG PO CAPS
ORAL_CAPSULE | ORAL | Status: AC
  Filled 2019-05-04: qty 1

## 2019-05-04 MED ORDER — PROCHLORPERAZINE MALEATE 10 MG PO TABS
10.0000 mg | ORAL_TABLET | Freq: Once | ORAL | Status: AC
  Administered 2019-05-04: 13:00:00 10 mg via ORAL

## 2019-05-04 MED ORDER — ACETAMINOPHEN 325 MG PO TABS
650.0000 mg | ORAL_TABLET | Freq: Once | ORAL | Status: AC
  Administered 2019-05-04: 13:00:00 650 mg via ORAL

## 2019-05-04 MED ORDER — DEXAMETHASONE 4 MG PO TABS
ORAL_TABLET | ORAL | Status: AC
  Filled 2019-05-04: qty 5

## 2019-05-04 MED ORDER — DIPHENHYDRAMINE HCL 25 MG PO TABS
25.0000 mg | ORAL_TABLET | Freq: Once | ORAL | Status: AC
  Administered 2019-05-04: 25 mg via ORAL
  Filled 2019-05-04: qty 1

## 2019-05-04 MED ORDER — PROCHLORPERAZINE MALEATE 10 MG PO TABS
ORAL_TABLET | ORAL | Status: AC
  Filled 2019-05-04: qty 1

## 2019-05-04 MED ORDER — ACETAMINOPHEN 325 MG PO TABS
ORAL_TABLET | ORAL | Status: AC
  Filled 2019-05-04: qty 2

## 2019-05-04 MED ORDER — DEXAMETHASONE 4 MG PO TABS
20.0000 mg | ORAL_TABLET | Freq: Once | ORAL | Status: AC
  Administered 2019-05-04: 13:00:00 20 mg via ORAL

## 2019-05-04 MED ORDER — SODIUM CHLORIDE 0.9 % IV SOLN
Freq: Once | INTRAVENOUS | Status: AC
  Administered 2019-05-04: 13:00:00 via INTRAVENOUS
  Filled 2019-05-04: qty 250

## 2019-05-04 MED ORDER — DEXTROSE 5 % IV SOLN
27.0000 mg/m2 | Freq: Once | INTRAVENOUS | Status: AC
  Administered 2019-05-04: 14:00:00 50 mg via INTRAVENOUS
  Filled 2019-05-04: qty 10

## 2019-05-04 MED ORDER — HEPARIN SOD (PORK) LOCK FLUSH 100 UNIT/ML IV SOLN
500.0000 [IU] | Freq: Once | INTRAVENOUS | Status: AC | PRN
  Filled 2019-05-04: qty 5

## 2019-05-04 MED ORDER — SODIUM CHLORIDE 0.9% FLUSH
10.0000 mL | INTRAVENOUS | Status: DC | PRN
  Administered 2019-05-04: 10 mL
  Filled 2019-05-04: qty 10

## 2019-05-04 NOTE — Patient Instructions (Signed)
[facility] Discharge Instructions for Patients Receiving Chemotherapy  Today you received the following chemotherapy agents kyprolis  To help prevent nausea and vomiting after your treatment, we encourage you to take your nausea medication as directed.   If you develop nausea and vomiting that is not controlled by your nausea medication, call the clinic.   BELOW ARE SYMPTOMS THAT SHOULD BE REPORTED IMMEDIATELY:  *FEVER GREATER THAN 100.5 F  *CHILLS WITH OR WITHOUT FEVER  NAUSEA AND VOMITING THAT IS NOT CONTROLLED WITH YOUR NAUSEA MEDICATION  *UNUSUAL SHORTNESS OF BREATH  *UNUSUAL BRUISING OR BLEEDING  TENDERNESS IN MOUTH AND THROAT WITH OR WITHOUT PRESENCE OF ULCERS  *URINARY PROBLEMS  *BOWEL PROBLEMS  UNUSUAL RASH Items with * indicate a potential emergency and should be followed up as soon as possible.  Feel free to call the clinic should you have any questions or concerns. The clinic phone number is [phone].  Please show the CHEMO ALERT CARD at check-in to the Emergency Department and triage nurse.   

## 2019-05-05 ENCOUNTER — Inpatient Hospital Stay: Payer: Medicare Other

## 2019-05-05 ENCOUNTER — Other Ambulatory Visit: Payer: Self-pay

## 2019-05-05 VITALS — BP 131/68 | HR 91 | Temp 98.3°F | Resp 18

## 2019-05-05 DIAGNOSIS — C9000 Multiple myeloma not having achieved remission: Secondary | ICD-10-CM

## 2019-05-05 DIAGNOSIS — Z5112 Encounter for antineoplastic immunotherapy: Secondary | ICD-10-CM | POA: Diagnosis not present

## 2019-05-05 DIAGNOSIS — Z7189 Other specified counseling: Secondary | ICD-10-CM

## 2019-05-05 MED ORDER — HEPARIN SOD (PORK) LOCK FLUSH 100 UNIT/ML IV SOLN
500.0000 [IU] | Freq: Once | INTRAVENOUS | Status: AC | PRN
  Administered 2019-05-05: 15:00:00 500 [IU]
  Filled 2019-05-05: qty 5

## 2019-05-05 MED ORDER — DEXTROSE 5 % IV SOLN
27.0000 mg/m2 | Freq: Once | INTRAVENOUS | Status: AC
  Administered 2019-05-05: 14:00:00 50 mg via INTRAVENOUS
  Filled 2019-05-05: qty 15

## 2019-05-05 MED ORDER — DIPHENHYDRAMINE HCL 25 MG PO TABS
25.0000 mg | ORAL_TABLET | Freq: Once | ORAL | Status: AC
  Administered 2019-05-05: 13:00:00 25 mg via ORAL
  Filled 2019-05-05: qty 1

## 2019-05-05 MED ORDER — FAMOTIDINE 20 MG PO TABS
20.0000 mg | ORAL_TABLET | Freq: Once | ORAL | Status: AC
  Administered 2019-05-05: 13:00:00 20 mg via ORAL

## 2019-05-05 MED ORDER — DEXAMETHASONE 4 MG PO TABS
20.0000 mg | ORAL_TABLET | Freq: Once | ORAL | Status: AC
  Administered 2019-05-05: 13:00:00 20 mg via ORAL

## 2019-05-05 MED ORDER — PROCHLORPERAZINE MALEATE 10 MG PO TABS
10.0000 mg | ORAL_TABLET | Freq: Once | ORAL | Status: AC
  Administered 2019-05-05: 14:00:00 10 mg via ORAL

## 2019-05-05 MED ORDER — FAMOTIDINE 20 MG PO TABS
ORAL_TABLET | ORAL | Status: AC
  Filled 2019-05-05: qty 1

## 2019-05-05 MED ORDER — DEXAMETHASONE 4 MG PO TABS
ORAL_TABLET | ORAL | Status: AC
  Filled 2019-05-05: qty 5

## 2019-05-05 MED ORDER — DIPHENHYDRAMINE HCL 25 MG PO CAPS
ORAL_CAPSULE | ORAL | Status: AC
  Filled 2019-05-05: qty 1

## 2019-05-05 MED ORDER — ACETAMINOPHEN 325 MG PO TABS
650.0000 mg | ORAL_TABLET | Freq: Once | ORAL | Status: AC
  Administered 2019-05-05: 13:00:00 650 mg via ORAL

## 2019-05-05 MED ORDER — PROCHLORPERAZINE MALEATE 10 MG PO TABS
ORAL_TABLET | ORAL | Status: AC
  Filled 2019-05-05: qty 1

## 2019-05-05 MED ORDER — ACETAMINOPHEN 325 MG PO TABS
ORAL_TABLET | ORAL | Status: AC
  Filled 2019-05-05: qty 2

## 2019-05-05 MED ORDER — SODIUM CHLORIDE 0.9% FLUSH
10.0000 mL | INTRAVENOUS | Status: DC | PRN
  Administered 2019-05-05: 15:00:00 10 mL
  Filled 2019-05-05: qty 10

## 2019-05-05 MED ORDER — SODIUM CHLORIDE 0.9 % IV SOLN
Freq: Once | INTRAVENOUS | Status: AC
  Administered 2019-05-05: 13:00:00 via INTRAVENOUS
  Filled 2019-05-05: qty 250

## 2019-05-05 NOTE — Patient Instructions (Signed)
[facility] Discharge Instructions for Patients Receiving Chemotherapy  Today you received the following chemotherapy agents kyprolis  To help prevent nausea and vomiting after your treatment, we encourage you to take your nausea medication as directed.   If you develop nausea and vomiting that is not controlled by your nausea medication, call the clinic.   BELOW ARE SYMPTOMS THAT SHOULD BE REPORTED IMMEDIATELY:  *FEVER GREATER THAN 100.5 F  *CHILLS WITH OR WITHOUT FEVER  NAUSEA AND VOMITING THAT IS NOT CONTROLLED WITH YOUR NAUSEA MEDICATION  *UNUSUAL SHORTNESS OF BREATH  *UNUSUAL BRUISING OR BLEEDING  TENDERNESS IN MOUTH AND THROAT WITH OR WITHOUT PRESENCE OF ULCERS  *URINARY PROBLEMS  *BOWEL PROBLEMS  UNUSUAL RASH Items with * indicate a potential emergency and should be followed up as soon as possible.  Feel free to call the clinic should you have any questions or concerns. The clinic phone number is [phone].  Please show the CHEMO ALERT CARD at check-in to the Emergency Department and triage nurse.   

## 2019-05-08 ENCOUNTER — Other Ambulatory Visit: Payer: Self-pay | Admitting: Hematology

## 2019-05-11 ENCOUNTER — Inpatient Hospital Stay: Payer: Medicare Other

## 2019-05-11 ENCOUNTER — Other Ambulatory Visit: Payer: Self-pay

## 2019-05-11 VITALS — BP 135/68 | HR 85 | Temp 98.7°F | Resp 16

## 2019-05-11 DIAGNOSIS — C9000 Multiple myeloma not having achieved remission: Secondary | ICD-10-CM

## 2019-05-11 DIAGNOSIS — Z95828 Presence of other vascular implants and grafts: Secondary | ICD-10-CM

## 2019-05-11 DIAGNOSIS — Z5112 Encounter for antineoplastic immunotherapy: Secondary | ICD-10-CM | POA: Diagnosis not present

## 2019-05-11 DIAGNOSIS — Z7189 Other specified counseling: Secondary | ICD-10-CM

## 2019-05-11 LAB — CBC WITH DIFFERENTIAL/PLATELET
Abs Immature Granulocytes: 0.14 10*3/uL — ABNORMAL HIGH (ref 0.00–0.07)
Basophils Absolute: 0.1 10*3/uL (ref 0.0–0.1)
Basophils Relative: 1 %
Eosinophils Absolute: 0.7 10*3/uL — ABNORMAL HIGH (ref 0.0–0.5)
Eosinophils Relative: 9 %
HCT: 37.8 % (ref 36.0–46.0)
Hemoglobin: 12.3 g/dL (ref 12.0–15.0)
Immature Granulocytes: 2 %
Lymphocytes Relative: 16 %
Lymphs Abs: 1.3 10*3/uL (ref 0.7–4.0)
MCH: 31.5 pg (ref 26.0–34.0)
MCHC: 32.5 g/dL (ref 30.0–36.0)
MCV: 96.9 fL (ref 80.0–100.0)
Monocytes Absolute: 1.4 10*3/uL — ABNORMAL HIGH (ref 0.1–1.0)
Monocytes Relative: 18 %
Neutro Abs: 4.5 10*3/uL (ref 1.7–7.7)
Neutrophils Relative %: 54 %
Platelets: 182 10*3/uL (ref 150–400)
RBC: 3.9 MIL/uL (ref 3.87–5.11)
RDW: 13.6 % (ref 11.5–15.5)
WBC: 8.1 10*3/uL (ref 4.0–10.5)
nRBC: 0 % (ref 0.0–0.2)

## 2019-05-11 LAB — CMP (CANCER CENTER ONLY)
ALT: 20 U/L (ref 0–44)
AST: 15 U/L (ref 15–41)
Albumin: 3.2 g/dL — ABNORMAL LOW (ref 3.5–5.0)
Alkaline Phosphatase: 75 U/L (ref 38–126)
Anion gap: 12 (ref 5–15)
BUN: 13 mg/dL (ref 8–23)
CO2: 22 mmol/L (ref 22–32)
Calcium: 9.3 mg/dL (ref 8.9–10.3)
Chloride: 105 mmol/L (ref 98–111)
Creatinine: 0.75 mg/dL (ref 0.44–1.00)
GFR, Est AFR Am: >60 mL/min (ref >60)
GFR, Estimated: >60 mL/min (ref >60)
Glucose, Bld: 220 mg/dL — ABNORMAL HIGH (ref 70–99)
Potassium: 3.4 mmol/L — ABNORMAL LOW (ref 3.5–5.1)
Sodium: 139 mmol/L (ref 135–145)
Total Bilirubin: 0.7 mg/dL (ref 0.3–1.2)
Total Protein: 6.2 g/dL — ABNORMAL LOW (ref 6.5–8.1)

## 2019-05-11 MED ORDER — DIPHENHYDRAMINE HCL 25 MG PO CAPS
ORAL_CAPSULE | ORAL | Status: AC
  Filled 2019-05-11: qty 1

## 2019-05-11 MED ORDER — DEXAMETHASONE 4 MG PO TABS
20.0000 mg | ORAL_TABLET | Freq: Once | ORAL | Status: AC
  Administered 2019-05-11: 20 mg via ORAL

## 2019-05-11 MED ORDER — DEXTROSE 5 % IV SOLN
27.0000 mg/m2 | Freq: Once | INTRAVENOUS | Status: AC
  Administered 2019-05-11: 50 mg via INTRAVENOUS
  Filled 2019-05-11: qty 10

## 2019-05-11 MED ORDER — SODIUM CHLORIDE 0.9% FLUSH
3.0000 mL | Freq: Once | INTRAVENOUS | Status: DC | PRN
  Filled 2019-05-11: qty 10

## 2019-05-11 MED ORDER — HEPARIN SOD (PORK) LOCK FLUSH 100 UNIT/ML IV SOLN
500.0000 [IU] | Freq: Once | INTRAVENOUS | Status: AC | PRN
  Administered 2019-05-11: 15:00:00 500 [IU]
  Filled 2019-05-11: qty 5

## 2019-05-11 MED ORDER — SODIUM CHLORIDE 0.9% FLUSH
10.0000 mL | INTRAVENOUS | Status: DC | PRN
  Administered 2019-05-11: 10 mL
  Filled 2019-05-11: qty 10

## 2019-05-11 MED ORDER — FAMOTIDINE 20 MG PO TABS
ORAL_TABLET | ORAL | Status: AC
  Filled 2019-05-11: qty 1

## 2019-05-11 MED ORDER — ACETAMINOPHEN 325 MG PO TABS
650.0000 mg | ORAL_TABLET | Freq: Once | ORAL | Status: AC
  Administered 2019-05-11: 650 mg via ORAL

## 2019-05-11 MED ORDER — ACETAMINOPHEN 325 MG PO TABS
ORAL_TABLET | ORAL | Status: AC
  Filled 2019-05-11: qty 2

## 2019-05-11 MED ORDER — SODIUM CHLORIDE 0.9 % IV SOLN
Freq: Once | INTRAVENOUS | Status: AC
  Administered 2019-05-11: 13:00:00 via INTRAVENOUS
  Filled 2019-05-11: qty 250

## 2019-05-11 MED ORDER — FAMOTIDINE 20 MG PO TABS
20.0000 mg | ORAL_TABLET | Freq: Once | ORAL | Status: AC
  Administered 2019-05-11: 20 mg via ORAL

## 2019-05-11 MED ORDER — HEPARIN SOD (PORK) LOCK FLUSH 100 UNIT/ML IV SOLN
250.0000 [IU] | Freq: Once | INTRAVENOUS | Status: DC | PRN
  Filled 2019-05-11: qty 5

## 2019-05-11 MED ORDER — PROCHLORPERAZINE MALEATE 10 MG PO TABS
ORAL_TABLET | ORAL | Status: AC
  Filled 2019-05-11: qty 1

## 2019-05-11 MED ORDER — SODIUM CHLORIDE 0.9% FLUSH
10.0000 mL | Freq: Once | INTRAVENOUS | Status: AC
  Administered 2019-05-11: 10 mL
  Filled 2019-05-11: qty 10

## 2019-05-11 MED ORDER — DEXAMETHASONE 4 MG PO TABS
ORAL_TABLET | ORAL | Status: AC
  Filled 2019-05-11: qty 5

## 2019-05-11 MED ORDER — DIPHENHYDRAMINE HCL 25 MG PO TABS
25.0000 mg | ORAL_TABLET | Freq: Once | ORAL | Status: AC
  Administered 2019-05-11: 25 mg via ORAL
  Filled 2019-05-11: qty 1

## 2019-05-11 MED ORDER — PROCHLORPERAZINE MALEATE 10 MG PO TABS
10.0000 mg | ORAL_TABLET | Freq: Once | ORAL | Status: AC
  Administered 2019-05-11: 10 mg via ORAL

## 2019-05-12 ENCOUNTER — Inpatient Hospital Stay: Payer: Medicare Other

## 2019-05-12 ENCOUNTER — Encounter: Payer: Self-pay | Admitting: Hematology

## 2019-05-12 ENCOUNTER — Other Ambulatory Visit: Payer: Self-pay

## 2019-05-12 VITALS — BP 134/68 | HR 95 | Temp 98.2°F | Resp 18

## 2019-05-12 DIAGNOSIS — Z5112 Encounter for antineoplastic immunotherapy: Secondary | ICD-10-CM | POA: Diagnosis not present

## 2019-05-12 DIAGNOSIS — Z7189 Other specified counseling: Secondary | ICD-10-CM

## 2019-05-12 DIAGNOSIS — C9000 Multiple myeloma not having achieved remission: Secondary | ICD-10-CM

## 2019-05-12 MED ORDER — PROCHLORPERAZINE MALEATE 10 MG PO TABS
ORAL_TABLET | ORAL | Status: AC
  Filled 2019-05-12: qty 1

## 2019-05-12 MED ORDER — SODIUM CHLORIDE 0.9% FLUSH
10.0000 mL | INTRAVENOUS | Status: DC | PRN
  Administered 2019-05-12: 15:00:00 10 mL
  Filled 2019-05-12: qty 10

## 2019-05-12 MED ORDER — PROCHLORPERAZINE MALEATE 10 MG PO TABS
10.0000 mg | ORAL_TABLET | Freq: Once | ORAL | Status: AC
  Administered 2019-05-12: 14:00:00 10 mg via ORAL

## 2019-05-12 MED ORDER — DEXAMETHASONE 4 MG PO TABS
20.0000 mg | ORAL_TABLET | Freq: Once | ORAL | Status: AC
  Administered 2019-05-12: 14:00:00 20 mg via ORAL

## 2019-05-12 MED ORDER — DEXTROSE 5 % IV SOLN
27.0000 mg/m2 | Freq: Once | INTRAVENOUS | Status: AC
  Administered 2019-05-12: 15:00:00 50 mg via INTRAVENOUS
  Filled 2019-05-12: qty 10

## 2019-05-12 MED ORDER — DEXAMETHASONE 4 MG PO TABS
ORAL_TABLET | ORAL | Status: AC
  Filled 2019-05-12: qty 5

## 2019-05-12 MED ORDER — ACETAMINOPHEN 325 MG PO TABS
ORAL_TABLET | ORAL | Status: AC
  Filled 2019-05-12: qty 2

## 2019-05-12 MED ORDER — SODIUM CHLORIDE 0.9 % IV SOLN
Freq: Once | INTRAVENOUS | Status: AC
  Administered 2019-05-12: 14:00:00 via INTRAVENOUS
  Filled 2019-05-12: qty 250

## 2019-05-12 MED ORDER — FAMOTIDINE 20 MG PO TABS
ORAL_TABLET | ORAL | Status: AC
  Filled 2019-05-12: qty 1

## 2019-05-12 MED ORDER — SODIUM CHLORIDE 0.9 % IV SOLN
Freq: Once | INTRAVENOUS | Status: AC
  Filled 2019-05-12: qty 250

## 2019-05-12 MED ORDER — ACETAMINOPHEN 325 MG PO TABS
650.0000 mg | ORAL_TABLET | Freq: Once | ORAL | Status: AC
  Administered 2019-05-12: 14:00:00 650 mg via ORAL

## 2019-05-12 MED ORDER — HEPARIN SOD (PORK) LOCK FLUSH 100 UNIT/ML IV SOLN
500.0000 [IU] | Freq: Once | INTRAVENOUS | Status: AC | PRN
  Administered 2019-05-12: 15:00:00 500 [IU]
  Filled 2019-05-12: qty 5

## 2019-05-12 MED ORDER — DIPHENHYDRAMINE HCL 25 MG PO CAPS
ORAL_CAPSULE | ORAL | Status: AC
  Filled 2019-05-12: qty 1

## 2019-05-12 MED ORDER — DIPHENHYDRAMINE HCL 25 MG PO TABS
25.0000 mg | ORAL_TABLET | Freq: Once | ORAL | Status: AC
  Administered 2019-05-12: 14:00:00 25 mg via ORAL
  Filled 2019-05-12: qty 1

## 2019-05-12 MED ORDER — FAMOTIDINE 20 MG PO TABS
20.0000 mg | ORAL_TABLET | Freq: Once | ORAL | Status: AC
  Administered 2019-05-12: 14:00:00 20 mg via ORAL

## 2019-05-13 ENCOUNTER — Other Ambulatory Visit: Payer: Self-pay | Admitting: Internal Medicine

## 2019-05-13 ENCOUNTER — Other Ambulatory Visit: Payer: Self-pay | Admitting: Hematology

## 2019-05-13 DIAGNOSIS — Z7189 Other specified counseling: Secondary | ICD-10-CM

## 2019-05-13 DIAGNOSIS — C9000 Multiple myeloma not having achieved remission: Secondary | ICD-10-CM

## 2019-05-15 ENCOUNTER — Other Ambulatory Visit: Payer: Self-pay | Admitting: *Deleted

## 2019-05-15 NOTE — Telephone Encounter (Signed)
Patient requested refill of Fentanyl patch

## 2019-05-16 MED ORDER — FENTANYL 12 MCG/HR TD PT72: 1.0000 | MEDICATED_PATCH | TRANSDERMAL | 0 refills | Status: DC

## 2019-05-19 ENCOUNTER — Ambulatory Visit: Payer: Medicare Other | Admitting: Obstetrics and Gynecology

## 2019-05-24 NOTE — Progress Notes (Signed)
HEMATOLOGY/ONCOLOGY CLINIC NOTE  Date of Service: 05/25/2019  Patient Care Team: Hoyt Koch, MD as PCP - General (Internal Medicine) Lafayette Dragon, MD (Inactive) as Consulting Physician (Gastroenterology) Megan Salon, MD as Consulting Physician (Gynecology) Melrose Nakayama, MD as Consulting Physician (Orthopedic Surgery) Deneise Lever, MD as Consulting Physician (Pulmonary Disease) [name], MD (Ophthalmology)  CHIEF COMPLAINTS/PURPOSE OF CONSULTATION:  myeloma  HISTORY OF PRESENTING ILLNESS:   Faith Orr is a wonderful 78 y.o. female who has been referred to [name] by Dr. Pricilla Holm for evaluation and management of Lytic bone lesions. She is accompanied today by her son in law, and her partner is present via phone. The pt reports that she is doing well overall.   The pt notes that 2-3 months ago while standing at the stove, and otherwise feeling normally, she felt "something snap that took her breath away" in her mid back as she stretched to get something. The pt notes that she saw a chiropractor twice due to her back stiffness, which did not help. The pt then described her new bone pains to her PCP on 12/05/18, and subsequent imaging, as noted below, revealed concerns for numerous bone lesions. She has begun 43m Fosamax. The pt notes that she had hip pain in 2018, and that an XR at that time did not reveal any lesions.  The pt notes that most of her pain is concentrated to her left shoulder presently, and with minimal movement of the arm. She endorses pain radiating into her left arm and notes that her hand is swollen in the mornings when she wakes up. She also endorses present back pain and right hip pain, worse when she walks. She has not yet seen orthopedics. The pt reports that she is needing to take 6054mAdvil every 4-6 hours as Tramadol alone has not been able to alleviate her pain. The pt denies any unexpected weight loss, fevers, chills, or night  sweats. The pt notes that her urine is a very dark color presently, but denies overt blood in the urine, underpants, nor tissue paper. The pt notes that in the last two weeks she has had some soreness in her head, but denies new headaches or changes in vision. The pt notes that she has been urinating more frequently overall, but has been trying to stay better hydrated as well.  The pt notes that she has been compliant with annual mammograms.   The pt notes that she had a cyst in her right breast which was removed in the past. She fractured her left wrist in 2008 after falling down stairs. The pt endorses history of fatty liver.   The pt denies ever smoking cigarettes and endorses significant second hand smoke exposure with a previous marriage.   Of note prior to the patient's visit today, pt has had a Bone Scan completed on 12/13/18 with results revealing Multifocal uptake throughout the skeleton, consistent with diffuse metastatic disease. Primary tumor is not specified. 2. Uptake in the proximal right femur, consistent with lytic lesions. 3. Uptake in the ribs bilaterally as described. 4. Lesions in the proximal left humerus. 5. Diffuse uptake throughout the skull consistent with metastatic disease. 6. Right paramedian uptake at the manubrium.  Most recent lab results (12/08/18) of CBC w/diff and CMP is as follows: all values are WNL except for Glucose at 279, BUN at 24, AST at 41, ALT at 46. 12/08/18 SPEP revealed all values WNL except for Total Protein at 6.0,  Albumin at 3.6, Gamma globulin at 0.7, and M spike at 0.5g  On review of systems, pt reports significant left shoulder pain, back pain, right hip pain, dark urine, and denies fevers, chills, night sweats, unexpected weight loss, changes in bowel habits, changes in breathing, cough, new respiratory symptoms, changes in vision, abdominal pains, leg swelling, and any other symptoms.   On PMHx the pt reports fatty liver, and denies blood clots.    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 Denies ever smoking.  On Family Hx the pt reports maternal grandmother with colon cancer. Father with bladder cancer and amyloidosis (pt notes that this could have been misdiagnosis). Mother with Protein S deficiency and polymyalgia rheumatica.  Interval History:   Faith Orr returns today for management and evaluation of her newly diagnosed Multiple myeloma and C5D1 treatment. The patient's last visit with [name] was on 04/27/19. The pt reports that she is doing well overall.  The pt reports that she tolerated C4 well overall. Today she notes that she feels stiff but relates this to her sleep positioning recently. She notes that her arm and hip pain has been stable to mildly improved. She has also been able to increase her activity levels with some walking around her home and garden. She has avoided lifting, bending, pushing, or pulling. The pt notes that she is decreased to using her break through pain medication every 12 hours, in addition to using her Fentanyl patch. She notes that she is eating well and notes that her blood sugars have ben well controlled, though she had two mornings in which her sugars were in the 60s. She notes that the adhesive on her glucose monitor has left a rash, got the monitor wet in the shower.  Lab results today (05/25/19) of CBC w/diff and CMP is as follows: all values are WNL except for RBC at 3.79, Monocytes abs at 1.3k, Glucose at 234, Calcium at 8.6, Total Protein at 6.2, Albumin at 3.1.  On review of systems, pt reports stable shoulder and arm pain, eating well, rash on upper left arm, good energy levels, and denies new bone pains, new back pains, leg swelling, and any other symptoms.   MEDICAL HISTORY:  Past Medical History:  Diagnosis Date   Allergy    seasonal   Asthma    DEPRESSION    DIABETES MELLITUS, TYPE II    Diverticulosis    HYPERLIPIDEMIA    Macular  degeneration of left eye    mild, Dr.Hecker   Obesity, unspecified    Osteoarthritis of both knees    OSTEOPENIA    Osteopenia    URINARY INCONTINENCE     SURGICAL HISTORY: Past Surgical History:  Procedure Laterality Date   BREAST SURGERY     CATARACT EXTRACTION Left 05/24/2018   CESAREAN SECTION  01/1973   FRACTURE SURGERY     IR IMAGING GUIDED PORT INSERTION  02/20/2019   left wrist surgery  2008   By Dr. Latanya Maudlin   right ankle  1994    SOCIAL HISTORY: Social History   Socioeconomic History   Marital status: Married    Spouse name: Not on file   Number of children: 1   Years of education: Not on file   Highest education level: Not on file  Occupational History    Employer: [employer] resource strain: Patient refused   Food insecurity  Worry: Patient refused    Inability: Patient refused   Transportation needs    Medical: Patient refused    Non-medical: Patient refused  Tobacco Use   Smoking status: Never Smoker   Smokeless tobacco: Never Used   Tobacco comment: Lives with partner [name]) and son  Substance and Sexual Activity   Alcohol use: No    Alcohol/week: 0.0 standard drinks   Drug use: No   Sexual activity: Never    Partners: Female    Birth control/protection: Post-menopausal    Comment: Lives with female partner (annette hicks) and 71 yo son  Lifestyle   Physical activity    Days per week: Patient refused    Minutes per session: Patient refused   Stress: Patient refused  Relationships   Social connections    Talks on phone: Patient refused    Gets together: Patient refused    Attends religious service: Patient refused    Active member of club or organization: Patient refused    Attends meetings of clubs or organizations: Patient refused    Relationship status: Patient refused   Intimate partner violence    Fear of current or ex partner: Patient refused     Emotionally abused: Patient refused    Physically abused: Patient refused    Forced sexual activity: Patient refused  Other Topics Concern   Not on file  Social History Narrative   Not on file    FAMILY HISTORY: Family History  Problem Relation Age of Onset   Diabetes Father    Hyperlipidemia Father    Heart disease Father    Cancer Father    Hypertension Father    Colon cancer Paternal Grandmother 65   Osteoporosis Mother    Protein S deficiency Mother    Hyperlipidemia Mother    Multiple sclerosis Daughter    Cancer Other        bladder   Breast cancer Neg Hx     ALLERGIES:  is allergic to penicillins; aleve [naproxen sodium]; and sulfonamide derivatives.  MEDICATIONS:  Current Outpatient Medications  Medication Sig Dispense Refill   acyclovir (ZOVIRAX) 400 MG tablet TAKE 1 TABLET(400 MG) BY MOUTH TWICE DAILY 60 tablet 0   albuterol (PROVENTIL HFA;VENTOLIN HFA) 108 (90 Base) MCG/ACT inhaler INHALE 1 TO 2 PUFFS INTO THE LUNGS EVERY 6 HOURS AS NEEDED FOR WHEEZING OR SHORTNESS OF BREATH (Patient not taking: No sig reported) 54 g 1   aspirin EC 81 MG tablet Take 81 mg by mouth daily after breakfast.      Blood Glucose Monitoring Suppl (FREESTYLE FREEDOM LITE) W/DEVICE KIT Use to check blood sugars twice a day Dx 250.00 1 each 0   Calcium Carbonate-Vitamin D (CALCIUM 600+D HIGH POTENCY) 600-400 MG-UNIT per tablet Take 1 tablet by mouth 2 (two) times daily.      Cetirizine HCl 10 MG CAPS Take 1 capsule (10 mg total) by mouth daily. 30 capsule 1   clotrimazole-betamethasone (LOTRISONE) cream Apply 1 application topically 2 (two) times daily. Over left arm rash 30 g 0   Continuous Blood Gluc Sensor (FREESTYLE LIBRE 14 DAY SENSOR) MISC 1 each by Does not apply route every 14 (fourteen) days. 2 each 11   dexamethasone (DECADRON) 4 MG tablet Take 5 tablets (20 mg total) by mouth once a week. On D22 of each cycle of treatment 20 tablet 5   DOK 100 MG capsule  TAKE 2 TABLETS BY MOUTH AT BEDTIME 60 capsule 1   fentaNYL (DURAGESIC) 12 MCG/HR Place  1 patch onto the skin every 3 (three) days. 10 patch 0   fexofenadine (ALLEGRA) 60 MG tablet Take 1 tablet (60 mg total) by mouth 2 (two) times daily. 60 tablet 1   fluticasone (FLONASE) 50 MCG/ACT nasal spray Place 1 spray into both nostrils daily. (Patient taking differently: Place 1 spray into both nostrils daily as needed for allergies or rhinitis. ) 16 g 2   glipiZIDE (GLUCOTROL XL) 5 MG 24 hr tablet TAKE 1 TABLET(5 MG) BY MOUTH DAILY WITH BREAKFAST 90 tablet 0   glucose blood (FREESTYLE LITE) test strip CHECK BLOOD SUGAR TWICE DAILY AS DIRECTED Dx 250.00 180 each 3   Lancets (FREESTYLE) lancets Use twice daily to check sugars. 100 each 11   lenalidomide (REVLIMID) 15 MG capsule Take 1 capsule (15 mg total) by mouth daily. Take for 21 days on, 7 days off, repeat every 28 days. 21 capsule 0   lidocaine-prilocaine (EMLA) cream APPLY 1 APPLICATION TO THE AFFECTED AREA AS NEEDED. USE PRIOR TO PORT ACCESS 30 g 0   LORazepam (ATIVAN) 0.5 MG tablet Take 1 tablet (0.5 mg total) by mouth every 8 (eight) hours as needed for anxiety (significant essential tremors). 60 tablet 0   metFORMIN (GLUCOPHAGE-XR) 500 MG 24 hr tablet Take 3 tablets (1,500 mg total) by mouth daily with breakfast. (Patient taking differently: Take 500-1,000 mg by mouth See admin instructions. 500 mg every morning, 1,000 mg every night) 270 tablet 3   montelukast (SINGULAIR) 10 MG tablet TAKE 1 TABLET BY MOUTH DAILY AS NEEDED (Patient taking differently: Take 10 mg by mouth at bedtime as needed (allergies). ) 30 tablet 3   Multiple Vitamins-Minerals (ICAPS) CAPS Take 1 capsule by mouth daily after breakfast.      ondansetron (ZOFRAN) 8 MG tablet Take 1 tablet (8 mg total) by mouth 2 (two) times daily as needed (Nausea or vomiting). 30 tablet 1   Oxycodone HCl 10 MG TABS Take 1 tablet (10 mg total) by mouth every 6 (six) hours as needed.  90 tablet 0   pantoprazole (PROTONIX) 20 MG tablet TAKE 1 TABLET(20 MG) BY MOUTH DAILY (Patient not taking: No sig reported) 30 tablet 5   polyethylene glycol (MIRALAX / GLYCOLAX) packet Take 17 g by mouth daily after breakfast.      prochlorperazine (COMPAZINE) 10 MG tablet Take 1 tablet (10 mg total) by mouth every 6 (six) hours as needed (Nausea or vomiting). 30 tablet 1   sertraline (ZOLOFT) 50 MG tablet TAKE 1 TABLET BY MOUTH DAILY 90 tablet 1   simvastatin (ZOCOR) 20 MG tablet TAKE 1 TABLET(20 MG) BY MOUTH DAILY 90 tablet 1   Triamcinolone Acetonide 0.025 % LOTN Apply 1 application topically 3 (three) times daily as needed (rash/itching). 60 mL 2   Vitamin D, Ergocalciferol, (DRISDOL) 1.25 MG (50000 UT) CAPS capsule TAKE 1 CAPSULE BY MOUTH EVERY 7 DAYS 12 capsule 0   No current facility-administered medications for this visit.    Facility-Administered Medications Ordered in Other Visits  Medication Dose Route Frequency Provider Last Rate Last Dose   heparin lock flush 100 unit/mL  500 Units Intracatheter Once PRN Brunetta Genera, MD        REVIEW OF SYSTEMS:    A 10+ POINT REVIEW OF SYSTEMS WAS OBTAINED including neurology, dermatology, psychiatry, cardiac, respiratory, lymph, extremities, GI, GU, Musculoskeletal, constitutional, breasts, reproductive, HEENT.  All pertinent positives are noted in the HPI.  All others are negative.   PHYSICAL EXAMINATION: ECOG PERFORMANCE STATUS: 2 -  Symptomatic, <50% confined to bed  Vitals:   05/25/19 1331  BP: 130/63  Pulse: 94  Resp: 18  Temp: 98.5 F (36.9 C)  SpO2: 97%   Filed Weights   05/25/19 1331  Weight: 172 lb 12.8 oz (78.4 kg)   .Body mass index is 34.03 kg/m.  GENERAL:alert, in no acute distress and comfortable SKIN: no acute rashes, no significant lesions EYES: conjunctiva are pink and non-injected, sclera anicteric OROPHARYNX: MMM, no exudates, no oropharyngeal erythema or ulceration NECK: supple, no  JVD LYMPH:  no palpable lymphadenopathy in the cervical, axillary or inguinal regions LUNGS: clear to auscultation b/l with normal respiratory effort HEART: regular rate & rhythm ABDOMEN:  normoactive bowel sounds , non tender, not distended. No palpable hepatosplenomegaly.  Extremity: no pedal edema PSYCH: alert & oriented x 3 with fluent speech NEURO: no focal motor/sensory deficits   LABORATORY DATA:  I have reviewed the data as listed  . CBC Latest Ref Rng & Units 05/25/2019 05/11/2019 05/04/2019  WBC 4.0 - 10.5 K/uL 7.4 8.1 7.8  Hemoglobin 12.0 - 15.0 g/dL 12.0 12.3 13.1  Hematocrit 36.0 - 46.0 % 36.9 37.8 40.2  Platelets 150 - 400 K/uL 232 182 156   . CBC    Component Value Date/Time   WBC 7.4 05/25/2019 1315   RBC 3.79 (L) 05/25/2019 1315   HGB 12.0 05/25/2019 1315   HGB 12.8 02/16/2019 1138   HCT 36.9 05/25/2019 1315   PLT 232 05/25/2019 1315   PLT 170 02/16/2019 1138   MCV 97.4 05/25/2019 1315   MCH 31.7 05/25/2019 1315   MCHC 32.5 05/25/2019 1315   RDW 13.8 05/25/2019 1315   LYMPHSABS 1.1 05/25/2019 1315   MONOABS 1.3 (H) 05/25/2019 1315   EOSABS 0.2 05/25/2019 1315   BASOSABS 0.1 05/25/2019 1315    . CMP Latest Ref Rng & Units 05/25/2019 05/11/2019 05/04/2019  Glucose 70 - 99 mg/dL 234(H) 220(H) 215(H)  BUN 8 - 23 mg/dL '10 13 15  ' Creatinine 0.44 - 1.00 mg/dL 0.75 0.75 0.75  Sodium 135 - 145 mmol/L 137 139 138  Potassium 3.5 - 5.1 mmol/L 3.6 3.4(L) 3.5  Chloride 98 - 111 mmol/L 104 105 104  CO2 22 - 32 mmol/L '22 22 22  ' Calcium 8.9 - 10.3 mg/dL 8.6(L) 9.3 9.8  Total Protein 6.5 - 8.1 g/dL 6.2(L) 6.2(L) 6.5  Total Bilirubin 0.3 - 1.2 mg/dL 0.7 0.7 0.8  Alkaline Phos 38 - 126 U/L 76 75 85  AST 15 - 41 U/L '17 15 19  ' ALT 0 - 44 U/L '20 20 27   ' Component     Latest Ref Rng & Units 12/27/2018  IgG (Immunoglobin G), Serum     700 - 1,600 mg/dL 801  IgA     64 - 422 mg/dL 91  IgM (Immunoglobulin M), Srm     26 - 217 mg/dL 15 (L)  Total Protein ELP     6.0 -  8.5 g/dL 6.6  Albumin SerPl Elph-Mcnc     2.9 - 4.4 g/dL 3.7  Alpha 1     0.0 - 0.4 g/dL 0.2  Alpha2 Glob SerPl Elph-Mcnc     0.4 - 1.0 g/dL 0.8  B-Globulin SerPl Elph-Mcnc     0.7 - 1.3 g/dL 1.2  Gamma Glob SerPl Elph-Mcnc     0.4 - 1.8 g/dL 0.7  M Protein SerPl Elph-Mcnc     Not Observed g/dL 0.5 (H)  Globulin, Total     2.2 - 3.9 g/dL  2.9  Albumin/Glob SerPl     0.7 - 1.7 1.3  IFE 1      Comment  Please Note (HCV):      Comment  Kappa free light chain     3.3 - 19.4 mg/L 5.1  Lamda free light chains     5.7 - 26.3 mg/L 40.3 (H)  Kappa, lamda light chain ratio     0.26 - 1.65 0.13 (L)  Beta-2 Microglobulin     0.6 - 2.4 mg/L 1.7  LDH     98 - 192 U/L 162  Sed Rate     0 - 22 mm/hr 8       01/06/19 Cytogenetics:       RADIOGRAPHIC STUDIES: I have personally reviewed the radiological images as listed and agreed with the findings in the report. No results found.  ASSESSMENT & PLAN:   78 y.o. female with  1. Recently diagnosed Multiple Myeloma, RISS Stage III  Labs upon initial presentation from 12/08/18, blood counts are normal including WBC at 7.1k, HGB at 13.1, and PLT at 245k. Calcium normal at 10.3. Creatinine normal at 0.63. M spike at 0.5g. 12/13/18 Bone Scan revealed Multifocal uptake throughout the skeleton, consistent with diffuse metastatic disease. Primary tumor is not specified. 2. Uptake in the proximal right femur, consistent with lytic lesions. 3. Uptake in the ribs bilaterally as described. 4. Lesions in the proximal left humerus. 5. Diffuse uptake throughout the skull consistent with metastatic disease. 6. Right paramedian uptake at the manubrium.  12/13/18 CT Right Femur revealed Numerous lytic lesions involving the right femur and a lytic lesion in the left inferior pubic ramus. Overall appearance is most concerning for multiple myeloma  12/27/18 Pretreatment 24hour UPEP observed an M spike at 54m, and showed 1996mtotal protein/day.   12/27/18 Pretreatment MMP revealed M Protein at 0.5g with IgG Lambda specificity. Kappa:Lambda light chain ratio at 0.13, with Lambda at 40.3. There is less abnormal protein and light chains than I would expect from 30% plasma cells, which suggests hypo-secretory or non-secretory neoplastic plasma cells. Will have an impact in assessing response. 01/05/19 PET/CT revealed Innumerable lytic lesions in the skeleton compatible with myeloma. Most of the larger lesions are hypermetabolic, for example including a left proximal humeral shaft lesion with maximum SUV of 8.1 and a 2.8 cm lesion in the left T9 vertebral body with maximum SUV 5.1. Most of the smaller lytic lesions, and some of the larger lesions, do not demonstrate accentuated metabolic activity. 2. 1.2 cm in short axis lymph node in the left parapharyngeal space is hypermetabolic with maximum SUV 11.8. I do not see a separate mass in the head and neck to give rise to this hypermetabolic lymph node. 3. Mosaic attenuation in the lower lobes, nonspecific possibly from air trapping. 4.  Aortic Atherosclerosis 5. Heterogeneous activity in the liver, making it hard to exclude small liver lesions. Consider hepatic protocol MRI with and without contrast for definitive assessment. Nonobstructive right nephrolithiasis. Old granulomatous disease  01/06/19 Bone Marrow biopsy revealed interstitial increase in plasma cells (28% aspirate, 40% CD138 immunohistochemistry). Plasma cells negative for light chains consistent with a non or weakly secretory myeloma   01/06/19 Cytogenetics revealed 37% of cells with trisomy 11 or 11q deletion, and 40.5% of cells with 17p mutation  2. 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  01/17/19 MRI Liver revealed Several appreciable liver lesions all have benign imaging characteristics.  No MRI findings of metastatic involvement of the liver. 2.  Scattered bony lesions corresponding to the lytic lesions seen at PET-CT, compatible with active myeloma. 3. Aortic Atherosclerosis.  Mild cardiomegaly. 4. Diffuse hepatic steatosis.   PLAN -Discussed pt labwork today, 05/25/19; blood counts and chemistries are stable, calcium a little low at 8.6 -Recommend consuming 10oz glass of milk each day -Discussed the last available MMP from 04/13/19 revealed normalized immunoglobulins and M Protein at 0.2g. Normalized serum free light chains as well. -The pt has no prohibitive toxicities from continuing C5D1 Carfilzomib, Revlimid, and Dexamethasone at this time. -Lotrisone for macerated skin rash on upper left extremity -Discussed again that the pt appears to have a hypo-secretory presentation, and thus these labs results are indicative of a response, but the most accurate measure of her response will require a repeat BM Biopsy after completing 5 cycles  -Will repeat BM Bx and PET/C after completing C5 -Discussed again the options after completing induction treatment including maintenance treatment until progression vs an autologous stem cell transplant followed by maintenance treatment.  -Offered to refer the pt to Dr. Blinda Leatherwood at [facility] for discussion and consideration of auto stem cell transplant. Pt would like to have a discussion with Dr. Norma Fredrickson and I will refer her. -Offered to refer pt to neurologist concerning her tremors. She would not like to do this.  -Will order low dose of Ativan. Advised against using this to sleep or before driving during first use. Discussed potential addictive properties and avoiding excessive use. -Continue 92m Claritin or Zyrtec and 241mPepcid for mild rashes, will continue to watch these -Adjusted premedications to add Tylenol and will split dose of Dexamethasone with pre-meds and pt will not take Decadron at home except on D22 -Okay to take Tylenol at home if feeling flushed, however pt will seek medical  attention if fevers rise above 100.4 or repeat in frequency -Will watch lymph node in left parapharyngeal space seen on PET/CT -Pt decided to hold off on prophylactic interventions to mitigate risk of pathologic fractures [facility] Dr[facility] lifting, pulling, pushing with left upper extremity  -Avoid high impact activities and stairs to preserve right hip as well -May be a role for local radiation therapy for pain control at T9 and left shoulder -Continue Fentanyl patch and 1052mxycodone for break through pain -Continue Zofran for opiate-related nausea -Continue Miralax once a day, and Senna S two tablets BID, back off as constipation resolves -Continue Zometa every 4 weeks -Continue Acyclovir and 22m55mpirin to mitigate risk of Shingles and blood clots respectively -Continue follow up with PCP Dr. ElizPricilla Holm management and evaluation of her DM, especially while on treatment with steroids as blood sugars will be higher -Will see the pt back in 4 weeks   -Please schedule remaining C5 and C6 of treatment as ordered -weekly labs on D1,D8 and D15 each cycle -PET/CT in 3 weeks -CT bone marrow Bx in 3 weeks -RTC with Dr KaleIrene Limbo4 weeks   All of the patients questions were answered with apparent satisfaction. The patient knows to call the clinic with any problems, questions or concerns.  The total time spent in the appt was 27 minutes and more than 50% was on counseling and direct patient cares.    GautSullivan LoneMS AGreenfieldIVMS SCH [facility] Center[facility]ology/Oncology Physician [facility]):       [phone] cell):  [phone]):  [phone]  05/25/2019 2:17 PM  I, Baldwin Jamaica, am acting as a scribe for Dr. Sullivan Lone.   .I have reviewed the above documentation for accuracy and completeness, and I agree with the above. Brunetta Genera MD

## 2019-05-25 ENCOUNTER — Inpatient Hospital Stay: Payer: Medicare Other | Attending: Hematology

## 2019-05-25 ENCOUNTER — Inpatient Hospital Stay: Payer: Medicare Other | Admitting: Hematology

## 2019-05-25 ENCOUNTER — Inpatient Hospital Stay: Payer: Medicare Other

## 2019-05-25 ENCOUNTER — Other Ambulatory Visit: Payer: Self-pay

## 2019-05-25 VITALS — BP 130/63 | HR 94 | Temp 98.5°F | Resp 18 | Ht 59.75 in | Wt 172.8 lb

## 2019-05-25 DIAGNOSIS — Z5111 Encounter for antineoplastic chemotherapy: Secondary | ICD-10-CM

## 2019-05-25 DIAGNOSIS — Z95828 Presence of other vascular implants and grafts: Secondary | ICD-10-CM

## 2019-05-25 DIAGNOSIS — J45909 Unspecified asthma, uncomplicated: Secondary | ICD-10-CM

## 2019-05-25 DIAGNOSIS — C9000 Multiple myeloma not having achieved remission: Secondary | ICD-10-CM

## 2019-05-25 DIAGNOSIS — E785 Hyperlipidemia, unspecified: Secondary | ICD-10-CM

## 2019-05-25 DIAGNOSIS — Z5112 Encounter for antineoplastic immunotherapy: Secondary | ICD-10-CM | POA: Insufficient documentation

## 2019-05-25 DIAGNOSIS — Z79899 Other long term (current) drug therapy: Secondary | ICD-10-CM | POA: Insufficient documentation

## 2019-05-25 DIAGNOSIS — Z7951 Long term (current) use of inhaled steroids: Secondary | ICD-10-CM | POA: Diagnosis not present

## 2019-05-25 DIAGNOSIS — Z8052 Family history of malignant neoplasm of bladder: Secondary | ICD-10-CM | POA: Diagnosis not present

## 2019-05-25 DIAGNOSIS — Z800 Family history of malignant neoplasm of digestive organs: Secondary | ICD-10-CM

## 2019-05-25 DIAGNOSIS — Z7982 Long term (current) use of aspirin: Secondary | ICD-10-CM | POA: Insufficient documentation

## 2019-05-25 DIAGNOSIS — Z7984 Long term (current) use of oral hypoglycemic drugs: Secondary | ICD-10-CM

## 2019-05-25 DIAGNOSIS — C7951 Secondary malignant neoplasm of bone: Secondary | ICD-10-CM

## 2019-05-25 DIAGNOSIS — E119 Type 2 diabetes mellitus without complications: Secondary | ICD-10-CM

## 2019-05-25 DIAGNOSIS — Z7189 Other specified counseling: Secondary | ICD-10-CM

## 2019-05-25 LAB — CBC WITH DIFFERENTIAL/PLATELET
Abs Immature Granulocytes: 0.07 10*3/uL (ref 0.00–0.07)
Basophils Absolute: 0.1 10*3/uL (ref 0.0–0.1)
Basophils Relative: 1 %
Eosinophils Absolute: 0.2 10*3/uL (ref 0.0–0.5)
Eosinophils Relative: 3 %
HCT: 36.9 % (ref 36.0–46.0)
Hemoglobin: 12 g/dL (ref 12.0–15.0)
Immature Granulocytes: 1 %
Lymphocytes Relative: 16 %
Lymphs Abs: 1.1 10*3/uL (ref 0.7–4.0)
MCH: 31.7 pg (ref 26.0–34.0)
MCHC: 32.5 g/dL (ref 30.0–36.0)
MCV: 97.4 fL (ref 80.0–100.0)
Monocytes Absolute: 1.3 10*3/uL — ABNORMAL HIGH (ref 0.1–1.0)
Monocytes Relative: 17 %
Neutro Abs: 4.6 10*3/uL (ref 1.7–7.7)
Neutrophils Relative %: 62 %
Platelets: 232 10*3/uL (ref 150–400)
RBC: 3.79 MIL/uL — ABNORMAL LOW (ref 3.87–5.11)
RDW: 13.8 % (ref 11.5–15.5)
WBC: 7.4 10*3/uL (ref 4.0–10.5)
nRBC: 0 % (ref 0.0–0.2)

## 2019-05-25 LAB — CMP (CANCER CENTER ONLY)
ALT: 20 U/L (ref 0–44)
AST: 17 U/L (ref 15–41)
Albumin: 3.1 g/dL — ABNORMAL LOW (ref 3.5–5.0)
Alkaline Phosphatase: 76 U/L (ref 38–126)
Anion gap: 11 (ref 5–15)
BUN: 10 mg/dL (ref 8–23)
CO2: 22 mmol/L (ref 22–32)
Calcium: 8.6 mg/dL — ABNORMAL LOW (ref 8.9–10.3)
Chloride: 104 mmol/L (ref 98–111)
Creatinine: 0.75 mg/dL (ref 0.44–1.00)
GFR, Est AFR Am: >60 mL/min (ref >60)
GFR, Estimated: >60 mL/min (ref >60)
Glucose, Bld: 234 mg/dL — ABNORMAL HIGH (ref 70–99)
Potassium: 3.6 mmol/L (ref 3.5–5.1)
Sodium: 137 mmol/L (ref 135–145)
Total Bilirubin: 0.7 mg/dL (ref 0.3–1.2)
Total Protein: 6.2 g/dL — ABNORMAL LOW (ref 6.5–8.1)

## 2019-05-25 MED ORDER — DEXAMETHASONE 4 MG PO TABS
ORAL_TABLET | ORAL | Status: AC
  Filled 2019-05-25: qty 5

## 2019-05-25 MED ORDER — SODIUM CHLORIDE 0.9% FLUSH
10.0000 mL | INTRAVENOUS | Status: DC | PRN
  Administered 2019-05-25: 10 mL
  Filled 2019-05-25: qty 10

## 2019-05-25 MED ORDER — PROCHLORPERAZINE MALEATE 10 MG PO TABS
10.0000 mg | ORAL_TABLET | Freq: Once | ORAL | Status: AC
  Administered 2019-05-25: 10 mg via ORAL

## 2019-05-25 MED ORDER — SODIUM CHLORIDE 0.9 % IV SOLN
Freq: Once | INTRAVENOUS | Status: AC
  Filled 2019-05-25: qty 250

## 2019-05-25 MED ORDER — DEXTROSE 5 % IV SOLN
27.0000 mg/m2 | Freq: Once | INTRAVENOUS | Status: AC
  Administered 2019-05-25: 50 mg via INTRAVENOUS
  Filled 2019-05-25: qty 15

## 2019-05-25 MED ORDER — HEPARIN SOD (PORK) LOCK FLUSH 100 UNIT/ML IV SOLN
500.0000 [IU] | Freq: Once | INTRAVENOUS | Status: AC | PRN
  Administered 2019-05-25: 500 [IU]
  Filled 2019-05-25: qty 5

## 2019-05-25 MED ORDER — SODIUM CHLORIDE 0.9% FLUSH
10.0000 mL | Freq: Once | INTRAVENOUS | Status: AC
  Administered 2019-05-25: 13:00:00 10 mL
  Filled 2019-05-25: qty 10

## 2019-05-25 MED ORDER — ACETAMINOPHEN 325 MG PO TABS
ORAL_TABLET | ORAL | Status: AC
  Filled 2019-05-25: qty 2

## 2019-05-25 MED ORDER — ZOLEDRONIC ACID 4 MG/100ML IV SOLN
4.0000 mg | Freq: Once | INTRAVENOUS | Status: AC
  Administered 2019-05-25: 4 mg via INTRAVENOUS
  Filled 2019-05-25: qty 100

## 2019-05-25 MED ORDER — PROCHLORPERAZINE MALEATE 10 MG PO TABS
ORAL_TABLET | ORAL | Status: AC
  Filled 2019-05-25: qty 1

## 2019-05-25 MED ORDER — FAMOTIDINE 20 MG PO TABS
20.0000 mg | ORAL_TABLET | Freq: Once | ORAL | Status: AC
  Administered 2019-05-25: 20 mg via ORAL

## 2019-05-25 MED ORDER — SODIUM CHLORIDE 0.9 % IV SOLN
Freq: Once | INTRAVENOUS | Status: AC
  Administered 2019-05-25: 15:00:00 via INTRAVENOUS
  Filled 2019-05-25: qty 250

## 2019-05-25 MED ORDER — DIPHENHYDRAMINE HCL 25 MG PO CAPS
ORAL_CAPSULE | ORAL | Status: AC
  Filled 2019-05-25: qty 1

## 2019-05-25 MED ORDER — CLOTRIMAZOLE-BETAMETHASONE 1-0.05 % EX CREA: 1.0000 "application " | TOPICAL_CREAM | Freq: Two times a day (BID) | CUTANEOUS | 0 refills | Status: DC

## 2019-05-25 MED ORDER — FAMOTIDINE 20 MG PO TABS
ORAL_TABLET | ORAL | Status: AC
  Filled 2019-05-25: qty 1

## 2019-05-25 MED ORDER — DEXAMETHASONE 4 MG PO TABS
20.0000 mg | ORAL_TABLET | Freq: Once | ORAL | Status: AC
  Administered 2019-05-25: 20 mg via ORAL

## 2019-05-25 MED ORDER — DIPHENHYDRAMINE HCL 25 MG PO TABS
25.0000 mg | ORAL_TABLET | Freq: Once | ORAL | Status: AC
  Administered 2019-05-25: 25 mg via ORAL
  Filled 2019-05-25: qty 1

## 2019-05-25 MED ORDER — ACETAMINOPHEN 325 MG PO TABS
650.0000 mg | ORAL_TABLET | Freq: Once | ORAL | Status: AC
  Administered 2019-05-25: 650 mg via ORAL

## 2019-05-25 NOTE — Patient Instructions (Signed)
[facility] Discharge Instructions for Patients Receiving Chemotherapy  Today you received the following chemotherapy agents:  Kyprolis  To help prevent nausea and vomiting after your treatment, we encourage you to take your nausea medication as directed.   If you develop nausea and vomiting that is not controlled by your nausea medication, call the clinic.   BELOW ARE SYMPTOMS THAT SHOULD BE REPORTED IMMEDIATELY:  *FEVER GREATER THAN 100.5 F  *CHILLS WITH OR WITHOUT FEVER  NAUSEA AND VOMITING THAT IS NOT CONTROLLED WITH YOUR NAUSEA MEDICATION  *UNUSUAL SHORTNESS OF BREATH  *UNUSUAL BRUISING OR BLEEDING  TENDERNESS IN MOUTH AND THROAT WITH OR WITHOUT PRESENCE OF ULCERS  *URINARY PROBLEMS  *BOWEL PROBLEMS  UNUSUAL RASH Items with * indicate a potential emergency and should be followed up as soon as possible.  Feel free to call the clinic should you have any questions or concerns. The clinic phone number is [phone].  Please show the CHEMO ALERT CARD at check-in to the Emergency Department and triage nurse.   

## 2019-05-26 ENCOUNTER — Other Ambulatory Visit: Payer: Self-pay

## 2019-05-26 ENCOUNTER — Inpatient Hospital Stay: Payer: Medicare Other

## 2019-05-26 VITALS — BP 117/62 | HR 88 | Temp 97.7°F | Resp 18

## 2019-05-26 DIAGNOSIS — Z7189 Other specified counseling: Secondary | ICD-10-CM

## 2019-05-26 DIAGNOSIS — Z5112 Encounter for antineoplastic immunotherapy: Secondary | ICD-10-CM | POA: Diagnosis not present

## 2019-05-26 DIAGNOSIS — C9000 Multiple myeloma not having achieved remission: Secondary | ICD-10-CM

## 2019-05-26 MED ORDER — DEXAMETHASONE 4 MG PO TABS
20.0000 mg | ORAL_TABLET | Freq: Once | ORAL | Status: AC
  Administered 2019-05-26: 20 mg via ORAL

## 2019-05-26 MED ORDER — HEPARIN SOD (PORK) LOCK FLUSH 100 UNIT/ML IV SOLN
500.0000 [IU] | Freq: Once | INTRAVENOUS | Status: AC | PRN
  Administered 2019-05-26: 500 [IU]
  Filled 2019-05-26: qty 5

## 2019-05-26 MED ORDER — SODIUM CHLORIDE 0.9 % IV SOLN
Freq: Once | INTRAVENOUS | Status: AC
  Administered 2019-05-26: 14:00:00 via INTRAVENOUS
  Filled 2019-05-26: qty 250

## 2019-05-26 MED ORDER — DEXTROSE 5 % IV SOLN
27.0000 mg/m2 | Freq: Once | INTRAVENOUS | Status: AC
  Administered 2019-05-26: 50 mg via INTRAVENOUS
  Filled 2019-05-26: qty 15

## 2019-05-26 MED ORDER — FAMOTIDINE 20 MG PO TABS
20.0000 mg | ORAL_TABLET | Freq: Once | ORAL | Status: AC
  Administered 2019-05-26: 20 mg via ORAL

## 2019-05-26 MED ORDER — DIPHENHYDRAMINE HCL 25 MG PO CAPS
ORAL_CAPSULE | ORAL | Status: AC
  Filled 2019-05-26: qty 1

## 2019-05-26 MED ORDER — FAMOTIDINE 20 MG PO TABS
ORAL_TABLET | ORAL | Status: AC
  Filled 2019-05-26: qty 1

## 2019-05-26 MED ORDER — ACETAMINOPHEN 325 MG PO TABS
ORAL_TABLET | ORAL | Status: AC
  Filled 2019-05-26: qty 2

## 2019-05-26 MED ORDER — DIPHENHYDRAMINE HCL 25 MG PO CAPS
25.0000 mg | ORAL_CAPSULE | Freq: Once | ORAL | Status: AC
  Administered 2019-05-26: 25 mg via ORAL

## 2019-05-26 MED ORDER — SODIUM CHLORIDE 0.9% FLUSH
10.0000 mL | INTRAVENOUS | Status: DC | PRN
  Administered 2019-05-26: 10 mL
  Filled 2019-05-26: qty 10

## 2019-05-26 MED ORDER — DEXAMETHASONE 4 MG PO TABS
ORAL_TABLET | ORAL | Status: AC
  Filled 2019-05-26: qty 5

## 2019-05-26 MED ORDER — ACETAMINOPHEN 325 MG PO TABS
650.0000 mg | ORAL_TABLET | Freq: Once | ORAL | Status: AC
  Administered 2019-05-26: 650 mg via ORAL

## 2019-05-26 MED ORDER — SODIUM CHLORIDE 0.9 % IV SOLN
Freq: Once | INTRAVENOUS | Status: AC
  Administered 2019-05-26: 13:00:00 via INTRAVENOUS
  Filled 2019-05-26: qty 250

## 2019-05-26 MED ORDER — PROCHLORPERAZINE MALEATE 10 MG PO TABS
10.0000 mg | ORAL_TABLET | Freq: Once | ORAL | Status: AC
  Administered 2019-05-26: 10 mg via ORAL

## 2019-05-26 MED ORDER — PROCHLORPERAZINE MALEATE 10 MG PO TABS
ORAL_TABLET | ORAL | Status: AC
  Filled 2019-05-26: qty 1

## 2019-05-26 NOTE — Patient Instructions (Signed)
[facility] Discharge Instructions for Patients Receiving Chemotherapy  Today you received the following chemotherapy agents Carfilzomib (KYPROLIS).  To help prevent nausea and vomiting after your treatment, we encourage you to take your nausea medication as prescribed.   If you develop nausea and vomiting that is not controlled by your nausea medication, call the clinic.   BELOW ARE SYMPTOMS THAT SHOULD BE REPORTED IMMEDIATELY:  *FEVER GREATER THAN 100.5 F  *CHILLS WITH OR WITHOUT FEVER  NAUSEA AND VOMITING THAT IS NOT CONTROLLED WITH YOUR NAUSEA MEDICATION  *UNUSUAL SHORTNESS OF BREATH  *UNUSUAL BRUISING OR BLEEDING  TENDERNESS IN MOUTH AND THROAT WITH OR WITHOUT PRESENCE OF ULCERS  *URINARY PROBLEMS  *BOWEL PROBLEMS  UNUSUAL RASH Items with * indicate a potential emergency and should be followed up as soon as possible.  Feel free to call the clinic should you have any questions or concerns. The clinic phone number is [phone].  Please show the [facility] at check-in to the Emergency Department and triage nurse.  Coronavirus (COVID-19) Are you at risk?  Are you at risk for the Coronavirus (COVID-19)?  To be considered HIGH RISK for Coronavirus (COVID-19), you have to meet the following criteria:  . Traveled to [place], [place], [place], [place] or [place]; or in the [place] to [place], [place], [place], or [place]; and have fever, cough, and shortness of breath within the last 2 weeks of travel OR . Been in close contact with a person diagnosed with COVID-19 within the last 2 weeks and have fever, cough, and shortness of breath . IF YOU DO NOT MEET THESE CRITERIA, YOU ARE CONSIDERED LOW RISK FOR COVID-19.  What to do if you are HIGH RISK for COVID-19?  Marland Kitchen If you are having a medical emergency, call 911. . Seek medical care right away. Before you go to a doctor's office, urgent care or emergency department, call ahead and tell them  about your recent travel, contact with someone diagnosed with COVID-19, and your symptoms. You should receive instructions from your physician's office regarding next steps of care.  . When you arrive at healthcare provider, tell the healthcare staff immediately you have returned from visiting [place], [place], [place], [place] or [place]; or traveled in the [place] to [place], [place], [place], or [place]; in the last two weeks or you have been in close contact with a person diagnosed with COVID-19 in the last 2 weeks.   . Tell the health care staff about your symptoms: fever, cough and shortness of breath. . After you have been seen by a medical provider, you will be either: o Tested for (COVID-19) and discharged home on quarantine except to seek medical care if symptoms worsen, and asked to  - Stay home and avoid contact with others until you get your results (4-5 days)  - Avoid travel on public transportation if possible (such as bus, train, or airplane) or o Sent to the Emergency Department by EMS for evaluation, COVID-19 testing, and possible admission depending on your condition and test results.  What to do if you are LOW RISK for COVID-19?  Reduce your risk of any infection by using the same precautions used for avoiding the common cold or flu:  Marland Kitchen Wash your hands often with soap and warm water for at least 20 seconds.  If soap and water are not readily available, use an alcohol-based hand sanitizer with at least 60% alcohol.  . If coughing or  sneezing, cover your mouth and nose by coughing or sneezing into the elbow areas of your shirt or coat, into a tissue or into your sleeve (not your hands). . Avoid shaking hands with others and consider head nods or verbal greetings only. . Avoid touching your eyes, nose, or mouth with unwashed hands.  . Avoid close contact with people who are sick. . Avoid places or events with large numbers of people in one location, like concerts or  sporting events. . Carefully consider travel plans you have or are making. . If you are planning any travel outside or inside the [name], visit the CDC's Travelers' Health webpage for the latest health notices. . If you have some symptoms but not all symptoms, continue to monitor at home and seek medical attention if your symptoms worsen. . If you are having a medical emergency, call 911.   [place] / e-Visit: [email or web address]         [facility]: [facility]: [phone]                   [facility]: [phone]

## 2019-05-29 ENCOUNTER — Telehealth: Payer: Self-pay | Admitting: Hematology

## 2019-05-29 ENCOUNTER — Other Ambulatory Visit: Payer: Self-pay | Admitting: Physician Assistant

## 2019-05-29 ENCOUNTER — Other Ambulatory Visit: Payer: Self-pay | Admitting: *Deleted

## 2019-05-29 ENCOUNTER — Telehealth: Payer: Self-pay | Admitting: *Deleted

## 2019-05-29 DIAGNOSIS — C9000 Multiple myeloma not having achieved remission: Secondary | ICD-10-CM

## 2019-05-29 MED ORDER — LENALIDOMIDE 15 MG PO CAPS: 15.0000 mg | ORAL_CAPSULE | Freq: Every day | ORAL | 0 refills | Status: DC

## 2019-05-29 NOTE — Telephone Encounter (Signed)
Refilled Revlimid per 05/25/2019 OV note. Refill sent to [facility]) [place], [place] #[id number], 05/29/2019

## 2019-05-29 NOTE — Telephone Encounter (Signed)
Scheduled appt per 6/11 los. °

## 2019-05-29 NOTE — Telephone Encounter (Signed)
Contacted [facility], Hematology and Oncology to initiate referral requested to Dr. Norma Fredrickson by Dr.Kale for Myeloma with Myeloma HSCT consideration. Spoke with Kennyth Lose, gave demographics and reason for referral. They will contact patient. Will fax last 2 OV notes and recent labs and scan reports.

## 2019-05-30 ENCOUNTER — Ambulatory Visit (HOSPITAL_COMMUNITY)
Admission: RE | Admit: 2019-05-30 | Discharge: 2019-05-30 | Disposition: A | Discharge status: Home / Self Care | Payer: Medicare Other | Source: Ambulatory Visit | Attending: Hematology | Admitting: Hematology

## 2019-05-30 ENCOUNTER — Other Ambulatory Visit: Payer: Self-pay

## 2019-05-30 DIAGNOSIS — Z7984 Long term (current) use of oral hypoglycemic drugs: Secondary | ICD-10-CM | POA: Insufficient documentation

## 2019-05-30 DIAGNOSIS — Z79899 Other long term (current) drug therapy: Secondary | ICD-10-CM | POA: Insufficient documentation

## 2019-05-30 DIAGNOSIS — E785 Hyperlipidemia, unspecified: Secondary | ICD-10-CM | POA: Diagnosis not present

## 2019-05-30 DIAGNOSIS — C9000 Multiple myeloma not having achieved remission: Secondary | ICD-10-CM | POA: Diagnosis not present

## 2019-05-30 DIAGNOSIS — D72822 Plasmacytosis: Secondary | ICD-10-CM | POA: Diagnosis not present

## 2019-05-30 DIAGNOSIS — Z7982 Long term (current) use of aspirin: Secondary | ICD-10-CM | POA: Insufficient documentation

## 2019-05-30 DIAGNOSIS — E119 Type 2 diabetes mellitus without complications: Secondary | ICD-10-CM | POA: Insufficient documentation

## 2019-05-30 LAB — CBC WITH DIFFERENTIAL/PLATELET
Abs Immature Granulocytes: 0.26 10*3/uL — ABNORMAL HIGH (ref 0.00–0.07)
Basophils Absolute: 0.1 10*3/uL (ref 0.0–0.1)
Basophils Relative: 1 %
Eosinophils Absolute: 0.3 10*3/uL (ref 0.0–0.5)
Eosinophils Relative: 3 %
HCT: 39.8 % (ref 36.0–46.0)
Hemoglobin: 13.3 g/dL (ref 12.0–15.0)
Immature Granulocytes: 3 %
Lymphocytes Relative: 16 %
Lymphs Abs: 1.6 10*3/uL (ref 0.7–4.0)
MCH: 31.8 pg (ref 26.0–34.0)
MCHC: 33.4 g/dL (ref 30.0–36.0)
MCV: 95.2 fL (ref 80.0–100.0)
Monocytes Absolute: 1.1 10*3/uL — ABNORMAL HIGH (ref 0.1–1.0)
Monocytes Relative: 11 %
Neutro Abs: 6.5 10*3/uL (ref 1.7–7.7)
Neutrophils Relative %: 66 %
Platelets: 173 10*3/uL (ref 150–400)
RBC: 4.18 MIL/uL (ref 3.87–5.11)
RDW: 13.5 % (ref 11.5–15.5)
WBC: 9.8 10*3/uL (ref 4.0–10.5)
nRBC: 0.9 % — ABNORMAL HIGH (ref 0.0–0.2)

## 2019-05-30 LAB — PROTIME-INR
INR: 1 (ref 0.8–1.2)
Prothrombin Time: 12.8 seconds (ref 11.4–15.2)

## 2019-05-30 LAB — GLUCOSE, CAPILLARY: Glucose-Capillary: 158 mg/dL — ABNORMAL HIGH (ref 70–99)

## 2019-05-30 LAB — APTT: aPTT: 25 seconds (ref 24–36)

## 2019-05-30 MED ORDER — FENTANYL CITRATE (PF) 100 MCG/2ML IJ SOLN
INTRAMUSCULAR | Status: AC | PRN
  Administered 2019-05-30: 50 ug via INTRAVENOUS

## 2019-05-30 MED ORDER — MIDAZOLAM HCL 2 MG/2ML IJ SOLN
INTRAMUSCULAR | Status: AC
  Filled 2019-05-30: qty 2

## 2019-05-30 MED ORDER — FENTANYL CITRATE (PF) 100 MCG/2ML IJ SOLN
INTRAMUSCULAR | Status: AC
  Filled 2019-05-30: qty 2

## 2019-05-30 MED ORDER — LIDOCAINE HCL (PF) 1 % IJ SOLN
INTRAMUSCULAR | Status: AC | PRN
  Administered 2019-05-30: 10 mL

## 2019-05-30 MED ORDER — MIDAZOLAM HCL 2 MG/2ML IJ SOLN
INTRAMUSCULAR | Status: AC | PRN
  Administered 2019-05-30: 1 mg via INTRAVENOUS

## 2019-05-30 MED ORDER — SODIUM CHLORIDE 0.9 % IV SOLN
INTRAVENOUS | Status: DC
  Administered 2019-05-30: 10:00:00 via INTRAVENOUS

## 2019-05-30 NOTE — Procedures (Signed)
Interventional Radiology Procedure Note  Procedure: CT guided bone marrow aspiration and biopsy  Complications: None  EBL: < 10 mL  Findings: Aspirate and core biopsy performed of bone marrow in right iliac bone.  Plan: Bedrest supine x 1 hrs  Faith Orr, M.D Pager:  [phone]   

## 2019-05-30 NOTE — H&P (Signed)
Referring Physician(s): Brunetta Genera  Supervising Physician: Aletta Edouard  Patient Status:  [facility] OP  Chief Complaint: "I'm here for a bone marrow biopsy"   Subjective: Patient familiar to IR service from prior bone marrow biopsy on 01/06/2019 and Port-A-Cath placement on 02/20/2019.  She has a history of multiple myeloma and presents again today for repeat bone marrow biopsy to evaluate treatment response.  She currently denies fever, headache, chest pain, dyspnea, cough, abdominal pain, nausea, vomiting or bleeding.  She does have some occasional back pain.  Past Medical History:  Diagnosis Date  . Allergy    seasonal  . Asthma   . DEPRESSION   . DIABETES MELLITUS, TYPE II   . Diverticulosis   . HYPERLIPIDEMIA   . Macular degeneration of left eye    mild, Dr.Hecker  . Obesity, unspecified   . Osteoarthritis of both knees   . OSTEOPENIA   . Osteopenia   . URINARY INCONTINENCE    Past Surgical History:  Procedure Laterality Date  . BREAST SURGERY    . CATARACT EXTRACTION Left 05/24/2018  . CESAREAN SECTION  01/1973  . FRACTURE SURGERY    . IR IMAGING GUIDED PORT INSERTION  02/20/2019  . left wrist surgery  2008   By Dr. Latanya Maudlin  . right ankle  1994      Allergies: Penicillins, Aleve [naproxen sodium], and Sulfonamide derivatives  Medications: Prior to Admission medications   Medication Sig Start Date End Date Taking? Authorizing Provider  acyclovir (ZOVIRAX) 400 MG tablet TAKE 1 TABLET(400 MG) BY MOUTH TWICE DAILY 05/15/19   Brunetta Genera, MD  albuterol (PROVENTIL HFA;VENTOLIN HFA) 108 (90 Base) MCG/ACT inhaler INHALE 1 TO 2 PUFFS INTO THE LUNGS EVERY 6 HOURS AS NEEDED FOR WHEEZING OR SHORTNESS OF BREATH Patient not taking: No sig reported 03/09/18   Hoyt Koch, MD  aspirin EC 81 MG tablet Take 81 mg by mouth daily after breakfast.     [provider]  Blood Glucose Monitoring Suppl (FREESTYLE FREEDOM LITE) W/DEVICE KIT Use to  check blood sugars twice a day Dx 250.00 06/01/14   Rowe Clack, MD  Calcium Carbonate-Vitamin D (CALCIUM 600+D HIGH POTENCY) 600-400 MG-UNIT per tablet Take 1 tablet by mouth 2 (two) times daily.     [provider]  Cetirizine HCl 10 MG CAPS Take 1 capsule (10 mg total) by mouth daily. 03/09/19   Tanner, Lyndon Code., PA-C  clotrimazole-betamethasone (LOTRISONE) cream Apply 1 application topically 2 (two) times daily. Over left arm rash 05/25/19   Brunetta Genera, MD  Continuous Blood Gluc Sensor (FREESTYLE LIBRE 14 DAY SENSOR) MISC 1 each by Does not apply route every 14 (fourteen) days. 02/07/19   Hoyt Koch, MD  dexamethasone (DECADRON) 4 MG tablet Take 5 tablets (20 mg total) by mouth once a week. On D22 of each cycle of treatment 03/09/19   Brunetta Genera, MD  DOK 100 MG capsule TAKE 2 TABLETS BY MOUTH AT BEDTIME 04/16/19   Brunetta Genera, MD  fentaNYL (DURAGESIC) 12 MCG/HR Place 1 patch onto the skin every 3 (three) days. 05/16/19   Brunetta Genera, MD  fexofenadine (ALLEGRA) 60 MG tablet Take 1 tablet (60 mg total) by mouth 2 (two) times daily. 03/09/19   Tanner, Lyndon Code., PA-C  fluticasone (FLONASE) 50 MCG/ACT nasal spray Place 1 spray into both nostrils daily. Patient taking differently: Place 1 spray into both nostrils daily as needed for allergies or rhinitis.  03/09/18  Hoyt Koch, MD  glipiZIDE (GLUCOTROL XL) 5 MG 24 hr tablet TAKE 1 TABLET(5 MG) BY MOUTH DAILY WITH BREAKFAST 03/29/19   Hoyt Koch, MD  glucose blood (FREESTYLE LITE) test strip CHECK BLOOD SUGAR TWICE DAILY AS DIRECTED Dx 250.00 07/13/14   Rowe Clack, MD  Lancets (FREESTYLE) lancets Use twice daily to check sugars. 04/15/16   Hoyt Koch, MD  lenalidomide (REVLIMID) 15 MG capsule Take 1 capsule (15 mg total) by mouth daily. Take for 21 days on, 7 days off, repeat every 28 days. 05/29/19   Brunetta Genera, MD  lidocaine-prilocaine (EMLA) cream  APPLY 1 APPLICATION TO THE AFFECTED AREA AS NEEDED. USE PRIOR TO PORT ACCESS 05/08/19   Brunetta Genera, MD  LORazepam (ATIVAN) 0.5 MG tablet Take 1 tablet (0.5 mg total) by mouth every 8 (eight) hours as needed for anxiety (significant essential tremors). 04/27/19   Brunetta Genera, MD  metFORMIN (GLUCOPHAGE-XR) 500 MG 24 hr tablet Take 3 tablets (1,500 mg total) by mouth daily with breakfast. Patient taking differently: Take 500-1,000 mg by mouth See admin instructions. 500 mg every morning, 1,000 mg every night 06/06/18   Hoyt Koch, MD  montelukast (SINGULAIR) 10 MG tablet TAKE 1 TABLET BY MOUTH DAILY AS NEEDED Patient taking differently: Take 10 mg by mouth at bedtime as needed (allergies).  05/11/18   Hoyt Koch, MD  Multiple Vitamins-Minerals (ICAPS) CAPS Take 1 capsule by mouth daily after breakfast.     [provider]  ondansetron (ZOFRAN) 8 MG tablet Take 1 tablet (8 mg total) by mouth 2 (two) times daily as needed (Nausea or vomiting). 01/16/19   Brunetta Genera, MD  Oxycodone HCl 10 MG TABS Take 1 tablet (10 mg total) by mouth every 6 (six) hours as needed. 04/13/19   Brunetta Genera, MD  pantoprazole (PROTONIX) 20 MG tablet TAKE 1 TABLET(20 MG) BY MOUTH DAILY Patient not taking: No sig reported 11/19/17   Hoyt Koch, MD  polyethylene glycol (MIRALAX / GLYCOLAX) packet Take 17 g by mouth daily after breakfast.     [provider]  prochlorperazine (COMPAZINE) 10 MG tablet Take 1 tablet (10 mg total) by mouth every 6 (six) hours as needed (Nausea or vomiting). 01/16/19   Brunetta Genera, MD  sertraline (ZOLOFT) 50 MG tablet TAKE 1 TABLET BY MOUTH DAILY 05/01/19   Hoyt Koch, MD  simvastatin (ZOCOR) 20 MG tablet TAKE 1 TABLET(20 MG) BY MOUTH DAILY 05/01/19   Hoyt Koch, MD  Triamcinolone Acetonide 0.025 % LOTN Apply 1 application topically 3 (three) times daily as needed (rash/itching). 03/09/19   Tanner,  Lyndon Code., PA-C  Vitamin D, Ergocalciferol, (DRISDOL) 1.25 MG (50000 UT) CAPS capsule TAKE 1 CAPSULE BY MOUTH EVERY 7 DAYS 05/15/19   Hoyt Koch, MD     Vital Signs: Blood pressure 142/77, heart rate 103, temp 98.5, respirations 18, O2 sats 95% room air LMP 10/09/2012   Physical Exam awake, alert.  Chest clear to auscultation bilaterally.  Heart with slightly tachycardic but regular rhythm.  Abdomen soft, positive bowel sounds, nontender.  No lower extremity edema.  Imaging: No results found.  Labs:  CBC: Recent Labs    04/27/19 1132 05/04/19 1212 05/11/19 1210 05/25/19 1315  WBC 8.4 7.8 8.1 7.4  HGB 12.9 13.1 12.3 12.0  HCT 39.8 40.2 37.8 36.9  PLT 288 156 182 232    COAGS: Recent Labs    01/06/19 0757 02/20/19 1257  INR 0.94 1.0  APTT 27  --     BMP: Recent Labs    04/27/19 1132 05/04/19 1212 05/11/19 1210 05/25/19 1315  NA 139 138 139 137  K 3.8 3.5 3.4* 3.6  CL 105 104 105 104  CO2 _0 GLUCOSE 233* 215* 220* 234*  BUN _1 CALCIUM 9.7 9.8 9.3 8.6*  CREATININE 0.77 0.75 0.75 0.75  GFRNONAA >60 >60 >60 >60  GFRAA >60 >60 >60 >60    LIVER FUNCTION TESTS: Recent Labs    04/27/19 1132 05/04/19 1212 05/11/19 1210 05/25/19 1315  BILITOT 0.7 0.8 0.7 0.7  AST _2 ALT _3 ALKPHOS 90 85 75 76  PROT 6.7 6.5 6.2* 6.2*  ALBUMIN 3.4* 3.4* 3.2* 3.1*    Assessment and Plan: Pt with history of multiple myeloma diagnosed in January of this year; presents today for CT-guided bone marrow biopsy to assess treatment response.Risks and benefits of procedure was discussed with the patient  including, but not limited to bleeding, infection, damage to adjacent structures or low yield requiring additional tests.  All of the questions were answered and there is agreement to proceed.  Consent signed and in chart.  LABS PENDING   Electronically Signed: D. Rowe Robert, PA-C 05/30/2019, 9:27 AM   I spent a total of 20  minutes at the the patient's bedside AND on the patient's hospital floor or unit, greater than 50% of which was counseling/coordinating care for CT-guided bone marrow biopsy

## 2019-05-30 NOTE — Discharge Instructions (Signed)
Bone Marrow Aspiration and Bone Marrow Biopsy, Adult, Care After °This sheet gives you information about how to care for yourself after your procedure. Your health care provider may also give you more specific instructions. If you have problems or questions, contact your health care provider. °What can I expect after the procedure? °After the procedure, it is common to have: °· Mild pain and tenderness. °· Swelling. °· Bruising. °Follow these instructions at home: °Puncture site care ° °  ° °· Follow instructions from your health care provider about how to take care of the puncture site. Make sure you: °? Wash your hands with soap and water before you change your bandage (dressing). If soap and water are not available, use hand sanitizer. °? Change your dressing as told by your health care provider. °· Check your puncture site every day for signs of infection. Check for: °? More redness, swelling, or pain. °? More fluid or blood. °? Warmth. °? Pus or a bad smell. °General instructions °· Take over-the-counter and prescription medicines only as told by your health care provider. °· Do not take baths, swim, or use a hot tub until your health care provider approves. Ask if you can take a shower or have a sponge bath. °· Return to your normal activities as told by your health care provider. Ask your health care provider what activities are safe for you. °· Do not drive for 24 hours if you were given a medicine to help you relax (sedative) during your procedure. °· Keep all follow-up visits as told by your health care provider. This is important. °Contact a health care provider if: °· Your pain is not controlled with medicine. °Get help right away if: °· You have a fever. °· You have more redness, swelling, or pain around the puncture site. °· You have more fluid or blood coming from the puncture site. °· Your puncture site feels warm to the touch. °· You have pus or a bad smell coming from the puncture site. °These  symptoms may represent a serious problem that is an emergency. Do not wait to see if the symptoms will go away. Get medical help right away. Call your local emergency services (911 in the [place] Do not drive yourself to the hospital. °Summary °· After the procedure, it is common to have mild pain, tenderness, swelling, and bruising. °· Follow instructions from your health care provider about how to take care of the puncture site. °· Get help right away if you have any symptoms of infection or if you have more blood or fluid coming from the puncture site. °This information is not intended to replace advice given to you by your health care provider. Make sure you discuss any questions you have with your health care provider. °Document Released: 06/19/2005 Document Revised: 03/15/2018 Document Reviewed: 05/13/2016 °Elsevier Interactive Patient Education © 2019 Elsevier Inc[employer] ° ° ° °Moderate Conscious Sedation, Adult, Care After °These instructions provide you with information about caring for yourself after your procedure. Your health care provider may also give you more specific instructions. Your treatment has been planned according to current medical practices, but problems sometimes occur. Call your health care provider if you have any problems or questions after your procedure. °What can I expect after the procedure? °After your procedure, it is common: °· To feel sleepy for several hours. °· To feel clumsy and have poor balance for several hours. °· To have poor judgment for several hours. °· To vomit if you eat too soon. °  Follow these instructions at home: For at least 24 hours after the procedure:   Do not: ? Participate in activities where you could fall or become injured. ? Drive. ? Use heavy machinery. ? Drink alcohol. ? Take sleeping pills or medicines that cause drowsiness. ? Make important decisions or sign legal documents. ? Take care of children on your own.  Rest. Eating and  drinking  Follow the diet recommended by your health care provider.  If you vomit: ? Drink water, juice, or soup when you can drink without vomiting. ? Make sure you have little or no nausea before eating solid foods. General instructions  Have a responsible adult stay with you until you are awake and alert.  Take over-the-counter and prescription medicines only as told by your health care provider.  If you smoke, do not smoke without supervision.  Keep all follow-up visits as told by your health care provider. This is important. Contact a health care provider if:  You keep feeling nauseous or you keep vomiting.  You feel light-headed.  You develop a rash.  You have a fever. Get help right away if:  You have trouble breathing. This information is not intended to replace advice given to you by your health care provider. Make sure you discuss any questions you have with your health care provider. Document Released: 09/20/2013 Document Revised: 05/04/2016 Document Reviewed: 03/21/2016 Elsevier Interactive Patient Education  2019 [employer].

## 2019-05-31 ENCOUNTER — Ambulatory Visit (HOSPITAL_COMMUNITY)
Admission: RE | Admit: 2019-05-31 | Discharge: 2019-05-31 | Disposition: A | Discharge status: Home / Self Care | Payer: Medicare Other | Source: Ambulatory Visit | Attending: Hematology | Admitting: Hematology

## 2019-05-31 DIAGNOSIS — C9000 Multiple myeloma not having achieved remission: Secondary | ICD-10-CM | POA: Insufficient documentation

## 2019-05-31 DIAGNOSIS — Z79899 Other long term (current) drug therapy: Secondary | ICD-10-CM | POA: Diagnosis not present

## 2019-05-31 LAB — GLUCOSE, CAPILLARY: Glucose-Capillary: 142 mg/dL — ABNORMAL HIGH (ref 70–99)

## 2019-05-31 MED ORDER — FLUDEOXYGLUCOSE F - 18 (FDG) INJECTION
9.0000 | Freq: Once | INTRAVENOUS | Status: AC | PRN
  Administered 2019-05-31: 9 via INTRAVENOUS

## 2019-06-01 ENCOUNTER — Other Ambulatory Visit: Payer: Self-pay

## 2019-06-01 ENCOUNTER — Inpatient Hospital Stay: Payer: Medicare Other

## 2019-06-01 VITALS — BP 122/65 | HR 84 | Temp 97.7°F | Resp 17

## 2019-06-01 DIAGNOSIS — Z7189 Other specified counseling: Secondary | ICD-10-CM

## 2019-06-01 DIAGNOSIS — C9000 Multiple myeloma not having achieved remission: Secondary | ICD-10-CM

## 2019-06-01 DIAGNOSIS — Z5112 Encounter for antineoplastic immunotherapy: Secondary | ICD-10-CM | POA: Diagnosis not present

## 2019-06-01 DIAGNOSIS — C7951 Secondary malignant neoplasm of bone: Secondary | ICD-10-CM

## 2019-06-01 DIAGNOSIS — Z95828 Presence of other vascular implants and grafts: Secondary | ICD-10-CM

## 2019-06-01 DIAGNOSIS — Z5111 Encounter for antineoplastic chemotherapy: Secondary | ICD-10-CM

## 2019-06-01 LAB — CBC WITH DIFFERENTIAL/PLATELET
Abs Immature Granulocytes: 0.16 10*3/uL — ABNORMAL HIGH (ref 0.00–0.07)
Basophils Absolute: 0.1 10*3/uL (ref 0.0–0.1)
Basophils Relative: 1 %
Eosinophils Absolute: 0.5 10*3/uL (ref 0.0–0.5)
Eosinophils Relative: 6 %
HCT: 38.5 % (ref 36.0–46.0)
Hemoglobin: 12.4 g/dL (ref 12.0–15.0)
Immature Granulocytes: 2 %
Lymphocytes Relative: 15 %
Lymphs Abs: 1.2 10*3/uL (ref 0.7–4.0)
MCH: 31.2 pg (ref 26.0–34.0)
MCHC: 32.2 g/dL (ref 30.0–36.0)
MCV: 97 fL (ref 80.0–100.0)
Monocytes Absolute: 1.8 10*3/uL — ABNORMAL HIGH (ref 0.1–1.0)
Monocytes Relative: 22 %
Neutro Abs: 4.3 10*3/uL (ref 1.7–7.7)
Neutrophils Relative %: 54 %
Platelets: 173 10*3/uL (ref 150–400)
RBC: 3.97 MIL/uL (ref 3.87–5.11)
RDW: 13.8 % (ref 11.5–15.5)
WBC: 8.1 10*3/uL (ref 4.0–10.5)
nRBC: 0 % (ref 0.0–0.2)

## 2019-06-01 LAB — CMP (CANCER CENTER ONLY)
ALT: 22 U/L (ref 0–44)
AST: 21 U/L (ref 15–41)
Albumin: 3.1 g/dL — ABNORMAL LOW (ref 3.5–5.0)
Alkaline Phosphatase: 78 U/L (ref 38–126)
Anion gap: 12 (ref 5–15)
BUN: 13 mg/dL (ref 8–23)
CO2: 19 mmol/L — ABNORMAL LOW (ref 22–32)
Calcium: 9.4 mg/dL (ref 8.9–10.3)
Chloride: 105 mmol/L (ref 98–111)
Creatinine: 0.76 mg/dL (ref 0.44–1.00)
GFR, Est AFR Am: >60 mL/min (ref >60)
GFR, Estimated: >60 mL/min (ref >60)
Glucose, Bld: 201 mg/dL — ABNORMAL HIGH (ref 70–99)
Potassium: 3.8 mmol/L (ref 3.5–5.1)
Sodium: 136 mmol/L (ref 135–145)
Total Bilirubin: 0.6 mg/dL (ref 0.3–1.2)
Total Protein: 6.4 g/dL — ABNORMAL LOW (ref 6.5–8.1)

## 2019-06-01 MED ORDER — DEXAMETHASONE 4 MG PO TABS
20.0000 mg | ORAL_TABLET | Freq: Once | ORAL | Status: AC
  Administered 2019-06-01: 20 mg via ORAL

## 2019-06-01 MED ORDER — SODIUM CHLORIDE 0.9 % IV SOLN
Freq: Once | INTRAVENOUS | Status: AC
  Administered 2019-06-01: 13:00:00 via INTRAVENOUS
  Filled 2019-06-01: qty 250

## 2019-06-01 MED ORDER — PROCHLORPERAZINE MALEATE 10 MG PO TABS
ORAL_TABLET | ORAL | Status: AC
  Filled 2019-06-01: qty 1

## 2019-06-01 MED ORDER — DIPHENHYDRAMINE HCL 25 MG PO CAPS
ORAL_CAPSULE | ORAL | Status: AC
  Filled 2019-06-01: qty 1

## 2019-06-01 MED ORDER — HEPARIN SOD (PORK) LOCK FLUSH 100 UNIT/ML IV SOLN
500.0000 [IU] | Freq: Once | INTRAVENOUS | Status: AC | PRN
  Administered 2019-06-01: 15:00:00 500 [IU]
  Filled 2019-06-01: qty 5

## 2019-06-01 MED ORDER — PROCHLORPERAZINE MALEATE 10 MG PO TABS
10.0000 mg | ORAL_TABLET | Freq: Once | ORAL | Status: AC
  Administered 2019-06-01: 10 mg via ORAL

## 2019-06-01 MED ORDER — ACETAMINOPHEN 325 MG PO TABS
650.0000 mg | ORAL_TABLET | Freq: Once | ORAL | Status: AC
  Administered 2019-06-01: 650 mg via ORAL

## 2019-06-01 MED ORDER — SODIUM CHLORIDE 0.9% FLUSH
10.0000 mL | Freq: Once | INTRAVENOUS | Status: AC
  Administered 2019-06-01: 12:00:00 10 mL
  Filled 2019-06-01: qty 10

## 2019-06-01 MED ORDER — DEXTROSE 5 % IV SOLN
27.0000 mg/m2 | Freq: Once | INTRAVENOUS | Status: AC
  Administered 2019-06-01: 15:00:00 50 mg via INTRAVENOUS
  Filled 2019-06-01: qty 15

## 2019-06-01 MED ORDER — SODIUM CHLORIDE 0.9% FLUSH
10.0000 mL | INTRAVENOUS | Status: DC | PRN
  Administered 2019-06-01: 10 mL
  Filled 2019-06-01: qty 10

## 2019-06-01 MED ORDER — FAMOTIDINE 20 MG PO TABS
20.0000 mg | ORAL_TABLET | Freq: Once | ORAL | Status: AC
  Administered 2019-06-01: 20 mg via ORAL

## 2019-06-01 MED ORDER — DIPHENHYDRAMINE HCL 25 MG PO TABS
25.0000 mg | ORAL_TABLET | Freq: Once | ORAL | Status: AC
  Administered 2019-06-01: 25 mg via ORAL
  Filled 2019-06-01: qty 1

## 2019-06-01 MED ORDER — FAMOTIDINE 20 MG PO TABS
ORAL_TABLET | ORAL | Status: AC
  Filled 2019-06-01: qty 1

## 2019-06-01 MED ORDER — ACETAMINOPHEN 325 MG PO TABS
ORAL_TABLET | ORAL | Status: AC
  Filled 2019-06-01: qty 2

## 2019-06-01 MED ORDER — DEXAMETHASONE 4 MG PO TABS
ORAL_TABLET | ORAL | Status: AC
  Filled 2019-06-01: qty 5

## 2019-06-01 NOTE — Patient Instructions (Signed)
[facility] Discharge Instructions for Patients Receiving Chemotherapy  Today you received the following chemotherapy agents:  Kyprolis  To help prevent nausea and vomiting after your treatment, we encourage you to take your nausea medication as directed.   If you develop nausea and vomiting that is not controlled by your nausea medication, call the clinic.   BELOW ARE SYMPTOMS THAT SHOULD BE REPORTED IMMEDIATELY:  *FEVER GREATER THAN 100.5 F  *CHILLS WITH OR WITHOUT FEVER  NAUSEA AND VOMITING THAT IS NOT CONTROLLED WITH YOUR NAUSEA MEDICATION  *UNUSUAL SHORTNESS OF BREATH  *UNUSUAL BRUISING OR BLEEDING  TENDERNESS IN MOUTH AND THROAT WITH OR WITHOUT PRESENCE OF ULCERS  *URINARY PROBLEMS  *BOWEL PROBLEMS  UNUSUAL RASH Items with * indicate a potential emergency and should be followed up as soon as possible.  Feel free to call the clinic should you have any questions or concerns. The clinic phone number is [phone].  Please show the CHEMO ALERT CARD at check-in to the Emergency Department and triage nurse.   

## 2019-06-01 NOTE — Telephone Encounter (Signed)
Requested documentation faxed to [facility] Hematology/Oncology [phone]. Fax confirmation received

## 2019-06-02 ENCOUNTER — Inpatient Hospital Stay: Payer: Medicare Other

## 2019-06-02 ENCOUNTER — Encounter: Payer: Self-pay | Admitting: Internal Medicine

## 2019-06-02 ENCOUNTER — Other Ambulatory Visit: Payer: Self-pay

## 2019-06-02 VITALS — BP 145/67 | HR 82 | Temp 98.7°F | Resp 17 | Ht 59.0 in

## 2019-06-02 DIAGNOSIS — C9000 Multiple myeloma not having achieved remission: Secondary | ICD-10-CM

## 2019-06-02 DIAGNOSIS — Z5112 Encounter for antineoplastic immunotherapy: Secondary | ICD-10-CM | POA: Diagnosis not present

## 2019-06-02 DIAGNOSIS — Z7189 Other specified counseling: Secondary | ICD-10-CM

## 2019-06-02 MED ORDER — SODIUM CHLORIDE 0.9% FLUSH
10.0000 mL | INTRAVENOUS | Status: DC | PRN
  Administered 2019-06-02: 10 mL
  Filled 2019-06-02: qty 10

## 2019-06-02 MED ORDER — FAMOTIDINE 20 MG PO TABS
ORAL_TABLET | ORAL | Status: AC
  Filled 2019-06-02: qty 1

## 2019-06-02 MED ORDER — HEPARIN SOD (PORK) LOCK FLUSH 100 UNIT/ML IV SOLN
500.0000 [IU] | Freq: Once | INTRAVENOUS | Status: AC | PRN
  Administered 2019-06-02: 15:00:00 500 [IU]
  Filled 2019-06-02: qty 5

## 2019-06-02 MED ORDER — DEXAMETHASONE 4 MG PO TABS
20.0000 mg | ORAL_TABLET | Freq: Once | ORAL | Status: AC
  Administered 2019-06-02: 20 mg via ORAL

## 2019-06-02 MED ORDER — DEXTROSE 5 % IV SOLN
27.0000 mg/m2 | Freq: Once | INTRAVENOUS | Status: AC
  Administered 2019-06-02: 50 mg via INTRAVENOUS
  Filled 2019-06-02: qty 10

## 2019-06-02 MED ORDER — ACETAMINOPHEN 325 MG PO TABS
650.0000 mg | ORAL_TABLET | Freq: Once | ORAL | Status: AC
  Administered 2019-06-02: 650 mg via ORAL

## 2019-06-02 MED ORDER — DEXAMETHASONE 4 MG PO TABS
ORAL_TABLET | ORAL | Status: AC
  Filled 2019-06-02: qty 5

## 2019-06-02 MED ORDER — SODIUM CHLORIDE 0.9 % IV SOLN
Freq: Once | INTRAVENOUS | Status: AC
  Administered 2019-06-02: 14:00:00 via INTRAVENOUS
  Filled 2019-06-02: qty 250

## 2019-06-02 MED ORDER — FAMOTIDINE 20 MG PO TABS
20.0000 mg | ORAL_TABLET | Freq: Once | ORAL | Status: AC
  Administered 2019-06-02: 20 mg via ORAL

## 2019-06-02 MED ORDER — DIPHENHYDRAMINE HCL 25 MG PO CAPS
ORAL_CAPSULE | ORAL | Status: AC
  Filled 2019-06-02: qty 1

## 2019-06-02 MED ORDER — PROCHLORPERAZINE MALEATE 10 MG PO TABS
ORAL_TABLET | ORAL | Status: AC
  Filled 2019-06-02: qty 1

## 2019-06-02 MED ORDER — PROCHLORPERAZINE MALEATE 10 MG PO TABS
10.0000 mg | ORAL_TABLET | Freq: Once | ORAL | Status: AC
  Administered 2019-06-02: 10 mg via ORAL

## 2019-06-02 MED ORDER — ACETAMINOPHEN 325 MG PO TABS
ORAL_TABLET | ORAL | Status: AC
  Filled 2019-06-02: qty 2

## 2019-06-02 MED ORDER — DIPHENHYDRAMINE HCL 25 MG PO TABS
25.0000 mg | ORAL_TABLET | Freq: Once | ORAL | Status: AC
  Administered 2019-06-02: 25 mg via ORAL
  Filled 2019-06-02: qty 1

## 2019-06-02 NOTE — Patient Instructions (Signed)
[facility] Discharge Instructions for Patients Receiving Chemotherapy  Today you received the following chemotherapy agents:  Kyprolis  To help prevent nausea and vomiting after your treatment, we encourage you to take your nausea medication as prescribed.   If you develop nausea and vomiting that is not controlled by your nausea medication, call the clinic.   BELOW ARE SYMPTOMS THAT SHOULD BE REPORTED IMMEDIATELY:  *FEVER GREATER THAN 100.5 F  *CHILLS WITH OR WITHOUT FEVER  NAUSEA AND VOMITING THAT IS NOT CONTROLLED WITH YOUR NAUSEA MEDICATION  *UNUSUAL SHORTNESS OF BREATH  *UNUSUAL BRUISING OR BLEEDING  TENDERNESS IN MOUTH AND THROAT WITH OR WITHOUT PRESENCE OF ULCERS  *URINARY PROBLEMS  *BOWEL PROBLEMS  UNUSUAL RASH Items with * indicate a potential emergency and should be followed up as soon as possible.  Feel free to call the clinic should you have any questions or concerns. The clinic phone number is [phone].  Please show the CHEMO ALERT CARD at check-in to the Emergency Department and triage nurse.   

## 2019-06-05 ENCOUNTER — Other Ambulatory Visit: Payer: Self-pay | Admitting: Hematology

## 2019-06-05 ENCOUNTER — Telehealth: Payer: Self-pay

## 2019-06-05 NOTE — Telephone Encounter (Signed)
LVM for patient to call back to switch to virtual if she was still wanting to or if wanting to come in do the screening process.

## 2019-06-06 ENCOUNTER — Encounter: Payer: Self-pay | Admitting: Internal Medicine

## 2019-06-06 ENCOUNTER — Ambulatory Visit (INDEPENDENT_AMBULATORY_CARE_PROVIDER_SITE_OTHER): Payer: Medicare Other | Admitting: Internal Medicine

## 2019-06-06 DIAGNOSIS — E118 Type 2 diabetes mellitus with unspecified complications: Secondary | ICD-10-CM | POA: Diagnosis not present

## 2019-06-06 DIAGNOSIS — F320 Major depressive disorder, single episode, mild: Secondary | ICD-10-CM | POA: Diagnosis not present

## 2019-06-06 DIAGNOSIS — C9000 Multiple myeloma not having achieved remission: Secondary | ICD-10-CM

## 2019-06-06 NOTE — Assessment & Plan Note (Signed)
She is still undergoing treatment and let her know that the lesions on PET scan did appear to be stable and mild improvement and no new lesions. She will keep visit with outside facility to discuss bone marrow transplant and see if they can get more concrete numbers with benefit and risk potential before making final decision.

## 2019-06-06 NOTE — Assessment & Plan Note (Signed)
Weight is down about 10 pounds since cancer treatment and her sugars are running reasonably well at home. Will ask her oncologist to add HgA1c to next labs and forward to [name] for treatment. Will adjust metformin and glipizide as needed.

## 2019-06-06 NOTE — Assessment & Plan Note (Signed)
Taking zoloft 50 mg daily and reminded that we have room to increase if needed. She is under a lot of stress recently with the pandemic as well as her current health problems.

## 2019-06-06 NOTE — Progress Notes (Signed)
Virtual Visit via Video Note  I connected with Faith Orr on 06/06/19 at  1:00 PM EDT by a video enabled telemedicine application and verified that I am speaking with the correct person using two identifiers.  The patient and the provider were at separate locations throughout the entire encounter.   I discussed the limitations of evaluation and management by telemedicine and the availability of in person appointments. The patient expressed understanding and agreed to proceed.  History of Present Illness: The patient is a 78 y.o. female with visit for follow up diabetes (doing well recently, used the free style libre and was getting readings around 90, had a rare episode to 50 overnight, denies missing meals etc, taking metformin and glipizde currently, still using steroids with her chemo, denies new numbness or tingling in feet) and multiple myeloma (recent bone marrow biopsy and pet scan to check in, is going to see a heme/onc provider at outside facility for consideration of bone marrow transplant, they are not sold on this or sure about the benefits and have been reading about this) and depression (she is doing okay, pandemic is causing her a lot more stress, denies SI/HI, she did get rx for ativan from oncologist but she is trying not to use this really, is still taking zoloft and reminded her that there is room to increase the dosage if needed)  Observations/Objective: Appearance: normal, breathing appears normal, casual grooming, abdomen does not appear distended, memory normal, mental status is A and O times 3  BP 149/84, O2 96%, pulse 84, 96.52F  Assessment and Plan: See problem oriented charting  Follow Up Instructions: needs HgA1c with next labs with oncology  I discussed the assessment and treatment plan with the patient. The patient was provided an opportunity to ask questions and all were answered. The patient agreed with the plan and demonstrated an understanding of the  instructions.   The patient was advised to call back or seek an in-person evaluation if the symptoms worsen or if the condition fails to improve as anticipated.  Hoyt Koch, MD

## 2019-06-08 ENCOUNTER — Other Ambulatory Visit: Payer: Self-pay

## 2019-06-08 ENCOUNTER — Inpatient Hospital Stay: Payer: Medicare Other

## 2019-06-08 VITALS — BP 129/72 | HR 90 | Temp 97.4°F | Resp 18

## 2019-06-08 DIAGNOSIS — C9000 Multiple myeloma not having achieved remission: Secondary | ICD-10-CM

## 2019-06-08 DIAGNOSIS — C7951 Secondary malignant neoplasm of bone: Secondary | ICD-10-CM

## 2019-06-08 DIAGNOSIS — Z5112 Encounter for antineoplastic immunotherapy: Secondary | ICD-10-CM | POA: Diagnosis not present

## 2019-06-08 DIAGNOSIS — Z95828 Presence of other vascular implants and grafts: Secondary | ICD-10-CM

## 2019-06-08 DIAGNOSIS — Z7189 Other specified counseling: Secondary | ICD-10-CM

## 2019-06-08 DIAGNOSIS — Z5111 Encounter for antineoplastic chemotherapy: Secondary | ICD-10-CM

## 2019-06-08 DIAGNOSIS — E118 Type 2 diabetes mellitus with unspecified complications: Secondary | ICD-10-CM

## 2019-06-08 LAB — HEMOGLOBIN A1C
Hgb A1c MFr Bld: 5.9 % — ABNORMAL HIGH (ref 4.8–5.6)
Mean Plasma Glucose: 122.63 mg/dL

## 2019-06-08 LAB — CMP (CANCER CENTER ONLY)
ALT: 20 U/L (ref 0–44)
AST: 15 U/L (ref 15–41)
Albumin: 3.3 g/dL — ABNORMAL LOW (ref 3.5–5.0)
Alkaline Phosphatase: 79 U/L (ref 38–126)
Anion gap: 9 (ref 5–15)
BUN: 14 mg/dL (ref 8–23)
CO2: 22 mmol/L (ref 22–32)
Calcium: 9.6 mg/dL (ref 8.9–10.3)
Chloride: 105 mmol/L (ref 98–111)
Creatinine: 0.75 mg/dL (ref 0.44–1.00)
GFR, Est AFR Am: >60 mL/min (ref >60)
GFR, Estimated: >60 mL/min (ref >60)
Glucose, Bld: 173 mg/dL — ABNORMAL HIGH (ref 70–99)
Potassium: 3.5 mmol/L (ref 3.5–5.1)
Sodium: 136 mmol/L (ref 135–145)
Total Bilirubin: 0.6 mg/dL (ref 0.3–1.2)
Total Protein: 6.4 g/dL — ABNORMAL LOW (ref 6.5–8.1)

## 2019-06-08 LAB — CBC WITH DIFFERENTIAL/PLATELET
Abs Immature Granulocytes: 0.16 10*3/uL — ABNORMAL HIGH (ref 0.00–0.07)
Basophils Absolute: 0.1 10*3/uL (ref 0.0–0.1)
Basophils Relative: 1 %
Eosinophils Absolute: 0.6 10*3/uL — ABNORMAL HIGH (ref 0.0–0.5)
Eosinophils Relative: 8 %
HCT: 38.9 % (ref 36.0–46.0)
Hemoglobin: 13 g/dL (ref 12.0–15.0)
Immature Granulocytes: 2 %
Lymphocytes Relative: 18 %
Lymphs Abs: 1.4 10*3/uL (ref 0.7–4.0)
MCH: 31.6 pg (ref 26.0–34.0)
MCHC: 33.4 g/dL (ref 30.0–36.0)
MCV: 94.4 fL (ref 80.0–100.0)
Monocytes Absolute: 1.6 10*3/uL — ABNORMAL HIGH (ref 0.1–1.0)
Monocytes Relative: 19 %
Neutro Abs: 4.3 10*3/uL (ref 1.7–7.7)
Neutrophils Relative %: 52 %
Platelets: 184 10*3/uL (ref 150–400)
RBC: 4.12 MIL/uL (ref 3.87–5.11)
RDW: 14 % (ref 11.5–15.5)
WBC: 8.1 10*3/uL (ref 4.0–10.5)
nRBC: 0 % (ref 0.0–0.2)

## 2019-06-08 MED ORDER — HEPARIN SOD (PORK) LOCK FLUSH 100 UNIT/ML IV SOLN
500.0000 [IU] | Freq: Once | INTRAVENOUS | Status: AC | PRN
  Administered 2019-06-08: 500 [IU]
  Filled 2019-06-08: qty 5

## 2019-06-08 MED ORDER — SODIUM CHLORIDE 0.9 % IV SOLN
Freq: Once | INTRAVENOUS | Status: AC
  Administered 2019-06-08: 13:00:00 via INTRAVENOUS
  Filled 2019-06-08: qty 250

## 2019-06-08 MED ORDER — FAMOTIDINE 20 MG PO TABS
20.0000 mg | ORAL_TABLET | Freq: Once | ORAL | Status: AC
  Administered 2019-06-08: 20 mg via ORAL

## 2019-06-08 MED ORDER — DEXTROSE 5 % IV SOLN
27.0000 mg/m2 | Freq: Once | INTRAVENOUS | Status: AC
  Administered 2019-06-08: 14:00:00 50 mg via INTRAVENOUS
  Filled 2019-06-08: qty 15

## 2019-06-08 MED ORDER — DIPHENHYDRAMINE HCL 25 MG PO TABS
25.0000 mg | ORAL_TABLET | Freq: Once | ORAL | Status: AC
  Administered 2019-06-08: 25 mg via ORAL
  Filled 2019-06-08: qty 1

## 2019-06-08 MED ORDER — FAMOTIDINE 20 MG PO TABS
ORAL_TABLET | ORAL | Status: AC
  Filled 2019-06-08: qty 1

## 2019-06-08 MED ORDER — SODIUM CHLORIDE 0.9 % IV SOLN
Freq: Once | INTRAVENOUS | Status: DC
  Filled 2019-06-08: qty 250

## 2019-06-08 MED ORDER — SODIUM CHLORIDE 0.9% FLUSH
10.0000 mL | Freq: Once | INTRAVENOUS | Status: AC
  Administered 2019-06-08: 10 mL
  Filled 2019-06-08: qty 10

## 2019-06-08 MED ORDER — DEXAMETHASONE 4 MG PO TABS
ORAL_TABLET | ORAL | Status: AC
  Filled 2019-06-08: qty 5

## 2019-06-08 MED ORDER — PROCHLORPERAZINE MALEATE 10 MG PO TABS
10.0000 mg | ORAL_TABLET | Freq: Once | ORAL | Status: AC
  Administered 2019-06-08: 10 mg via ORAL

## 2019-06-08 MED ORDER — ACETAMINOPHEN 325 MG PO TABS
ORAL_TABLET | ORAL | Status: AC
  Filled 2019-06-08: qty 2

## 2019-06-08 MED ORDER — PROCHLORPERAZINE MALEATE 10 MG PO TABS
ORAL_TABLET | ORAL | Status: AC
  Filled 2019-06-08: qty 1

## 2019-06-08 MED ORDER — DEXAMETHASONE 4 MG PO TABS
20.0000 mg | ORAL_TABLET | Freq: Once | ORAL | Status: AC
  Administered 2019-06-08: 20 mg via ORAL

## 2019-06-08 MED ORDER — SODIUM CHLORIDE 0.9% FLUSH
10.0000 mL | INTRAVENOUS | Status: DC | PRN
  Administered 2019-06-08: 15:00:00 10 mL
  Filled 2019-06-08: qty 10

## 2019-06-08 MED ORDER — DIPHENHYDRAMINE HCL 25 MG PO CAPS
ORAL_CAPSULE | ORAL | Status: AC
  Filled 2019-06-08: qty 1

## 2019-06-08 MED ORDER — ACETAMINOPHEN 325 MG PO TABS
650.0000 mg | ORAL_TABLET | Freq: Once | ORAL | Status: AC
  Administered 2019-06-08: 650 mg via ORAL

## 2019-06-08 NOTE — Patient Instructions (Signed)
[facility] Discharge Instructions for Patients Receiving Chemotherapy  Today you received the following chemotherapy agents:  Kyprolis  To help prevent nausea and vomiting after your treatment, we encourage you to take your nausea medication as directed.   If you develop nausea and vomiting that is not controlled by your nausea medication, call the clinic.   BELOW ARE SYMPTOMS THAT SHOULD BE REPORTED IMMEDIATELY:  *FEVER GREATER THAN 100.5 F  *CHILLS WITH OR WITHOUT FEVER  NAUSEA AND VOMITING THAT IS NOT CONTROLLED WITH YOUR NAUSEA MEDICATION  *UNUSUAL SHORTNESS OF BREATH  *UNUSUAL BRUISING OR BLEEDING  TENDERNESS IN MOUTH AND THROAT WITH OR WITHOUT PRESENCE OF ULCERS  *URINARY PROBLEMS  *BOWEL PROBLEMS  UNUSUAL RASH Items with * indicate a potential emergency and should be followed up as soon as possible.  Feel free to call the clinic should you have any questions or concerns. The clinic phone number is [phone].  Please show the CHEMO ALERT CARD at check-in to the Emergency Department and triage nurse.   

## 2019-06-09 ENCOUNTER — Other Ambulatory Visit: Payer: Self-pay

## 2019-06-09 ENCOUNTER — Inpatient Hospital Stay: Payer: Medicare Other

## 2019-06-09 VITALS — BP 135/85 | HR 90 | Temp 98.3°F | Resp 18

## 2019-06-09 DIAGNOSIS — C9000 Multiple myeloma not having achieved remission: Secondary | ICD-10-CM

## 2019-06-09 DIAGNOSIS — Z5112 Encounter for antineoplastic immunotherapy: Secondary | ICD-10-CM | POA: Diagnosis not present

## 2019-06-09 DIAGNOSIS — Z7189 Other specified counseling: Secondary | ICD-10-CM

## 2019-06-09 LAB — KAPPA/LAMBDA LIGHT CHAINS
Kappa free light chain: 16 mg/L (ref 3.3–19.4)
Kappa, lambda light chain ratio: 1.16 (ref 0.26–1.65)
Lambda free light chains: 13.8 mg/L (ref 5.7–26.3)

## 2019-06-09 LAB — MULTIPLE MYELOMA PANEL, SERUM
Albumin SerPl Elph-Mcnc: 3.3 g/dL (ref 2.9–4.4)
Albumin/Glob SerPl: 1.3 (ref 0.7–1.7)
Alpha 1: 0.2 g/dL (ref 0.0–0.4)
Alpha2 Glob SerPl Elph-Mcnc: 0.7 g/dL (ref 0.4–1.0)
B-Globulin SerPl Elph-Mcnc: 1.2 g/dL (ref 0.7–1.3)
Gamma Glob SerPl Elph-Mcnc: 0.5 g/dL (ref 0.4–1.8)
Globulin, Total: 2.6 g/dL (ref 2.2–3.9)
IgA: 196 mg/dL (ref 64–422)
IgG (Immunoglobin G), Serum: 543 mg/dL — ABNORMAL LOW (ref 586–1602)
IgM (Immunoglobulin M), Srm: 33 mg/dL (ref 26–217)
M Protein SerPl Elph-Mcnc: 0.2 g/dL — ABNORMAL HIGH
Total Protein ELP: 5.9 g/dL — ABNORMAL LOW (ref 6.0–8.5)

## 2019-06-09 LAB — VITAMIN D 25 HYDROXY (VIT D DEFICIENCY, FRACTURES): Vit D, 25-Hydroxy: 55.9 ng/mL (ref 30.0–100.0)

## 2019-06-09 MED ORDER — DIPHENHYDRAMINE HCL 25 MG PO TABS
25.0000 mg | ORAL_TABLET | Freq: Once | ORAL | Status: AC
  Administered 2019-06-09: 25 mg via ORAL
  Filled 2019-06-09: qty 1

## 2019-06-09 MED ORDER — ACETAMINOPHEN 325 MG PO TABS
650.0000 mg | ORAL_TABLET | Freq: Once | ORAL | Status: AC
  Administered 2019-06-09: 650 mg via ORAL

## 2019-06-09 MED ORDER — DEXAMETHASONE 4 MG PO TABS
20.0000 mg | ORAL_TABLET | Freq: Once | ORAL | Status: AC
  Administered 2019-06-09: 20 mg via ORAL

## 2019-06-09 MED ORDER — DEXAMETHASONE 4 MG PO TABS
ORAL_TABLET | ORAL | Status: AC
  Filled 2019-06-09: qty 5

## 2019-06-09 MED ORDER — SODIUM CHLORIDE 0.9 % IV SOLN
Freq: Once | INTRAVENOUS | Status: DC
  Filled 2019-06-09: qty 250

## 2019-06-09 MED ORDER — HEPARIN SOD (PORK) LOCK FLUSH 100 UNIT/ML IV SOLN
500.0000 [IU] | Freq: Once | INTRAVENOUS | Status: AC | PRN
  Administered 2019-06-09: 500 [IU]
  Filled 2019-06-09: qty 5

## 2019-06-09 MED ORDER — DIPHENHYDRAMINE HCL 25 MG PO CAPS
ORAL_CAPSULE | ORAL | Status: AC
  Filled 2019-06-09: qty 1

## 2019-06-09 MED ORDER — ACETAMINOPHEN 325 MG PO TABS
ORAL_TABLET | ORAL | Status: AC
  Filled 2019-06-09: qty 2

## 2019-06-09 MED ORDER — DEXTROSE 5 % IV SOLN
27.0000 mg/m2 | Freq: Once | INTRAVENOUS | Status: AC
  Administered 2019-06-09: 50 mg via INTRAVENOUS
  Filled 2019-06-09: qty 15

## 2019-06-09 MED ORDER — FAMOTIDINE 20 MG PO TABS
20.0000 mg | ORAL_TABLET | Freq: Once | ORAL | Status: AC
  Administered 2019-06-09: 20 mg via ORAL

## 2019-06-09 MED ORDER — SODIUM CHLORIDE 0.9 % IV SOLN
Freq: Once | INTRAVENOUS | Status: AC
  Administered 2019-06-09: 13:00:00 via INTRAVENOUS
  Filled 2019-06-09: qty 250

## 2019-06-09 MED ORDER — PROCHLORPERAZINE MALEATE 10 MG PO TABS
10.0000 mg | ORAL_TABLET | Freq: Once | ORAL | Status: AC
  Administered 2019-06-09: 10 mg via ORAL

## 2019-06-09 MED ORDER — SODIUM CHLORIDE 0.9% FLUSH
10.0000 mL | INTRAVENOUS | Status: DC | PRN
  Administered 2019-06-09: 10 mL
  Filled 2019-06-09: qty 10

## 2019-06-09 MED ORDER — FAMOTIDINE 20 MG PO TABS
ORAL_TABLET | ORAL | Status: AC
  Filled 2019-06-09: qty 1

## 2019-06-09 MED ORDER — PROCHLORPERAZINE MALEATE 10 MG PO TABS
ORAL_TABLET | ORAL | Status: AC
  Filled 2019-06-09: qty 1

## 2019-06-09 NOTE — Patient Instructions (Signed)
[facility] Discharge Instructions for Patients Receiving Chemotherapy  Today you received the following chemotherapy agents:  Kyprolis  To help prevent nausea and vomiting after your treatment, we encourage you to take your nausea medication as directed.   If you develop nausea and vomiting that is not controlled by your nausea medication, call the clinic.   BELOW ARE SYMPTOMS THAT SHOULD BE REPORTED IMMEDIATELY:  *FEVER GREATER THAN 100.5 F  *CHILLS WITH OR WITHOUT FEVER  NAUSEA AND VOMITING THAT IS NOT CONTROLLED WITH YOUR NAUSEA MEDICATION  *UNUSUAL SHORTNESS OF BREATH  *UNUSUAL BRUISING OR BLEEDING  TENDERNESS IN MOUTH AND THROAT WITH OR WITHOUT PRESENCE OF ULCERS  *URINARY PROBLEMS  *BOWEL PROBLEMS  UNUSUAL RASH Items with * indicate a potential emergency and should be followed up as soon as possible.  Feel free to call the clinic should you have any questions or concerns. The clinic phone number is [phone].  Please show the CHEMO ALERT CARD at check-in to the Emergency Department and triage nurse.   

## 2019-06-12 ENCOUNTER — Other Ambulatory Visit: Payer: Self-pay | Admitting: Hematology

## 2019-06-12 DIAGNOSIS — E118 Type 2 diabetes mellitus with unspecified complications: Secondary | ICD-10-CM

## 2019-06-17 ENCOUNTER — Encounter: Payer: Self-pay | Admitting: Hematology

## 2019-06-17 ENCOUNTER — Other Ambulatory Visit: Payer: Self-pay | Admitting: Internal Medicine

## 2019-06-17 ENCOUNTER — Other Ambulatory Visit: Payer: Self-pay | Admitting: Hematology

## 2019-06-17 ENCOUNTER — Encounter: Payer: Self-pay | Admitting: Internal Medicine

## 2019-06-17 DIAGNOSIS — C9000 Multiple myeloma not having achieved remission: Secondary | ICD-10-CM

## 2019-06-17 DIAGNOSIS — Z7189 Other specified counseling: Secondary | ICD-10-CM

## 2019-06-19 ENCOUNTER — Other Ambulatory Visit: Payer: Self-pay | Admitting: *Deleted

## 2019-06-19 ENCOUNTER — Telehealth: Payer: Self-pay | Admitting: *Deleted

## 2019-06-19 DIAGNOSIS — Z7189 Other specified counseling: Secondary | ICD-10-CM

## 2019-06-19 DIAGNOSIS — C9000 Multiple myeloma not having achieved remission: Secondary | ICD-10-CM

## 2019-06-19 MED ORDER — OXYCODONE HCL 10 MG PO TABS: 10.0000 mg | ORAL_TABLET | Freq: Four times a day (QID) | ORAL | 0 refills | Status: DC | PRN

## 2019-06-19 MED ORDER — ACYCLOVIR 400 MG PO TABS: ORAL_TABLET | ORAL | 0 refills | Status: DC

## 2019-06-19 MED ORDER — FENTANYL 12 MCG/HR TD PT72: 1.0000 | MEDICATED_PATCH | TRANSDERMAL | 0 refills | Status: DC

## 2019-06-19 NOTE — Telephone Encounter (Signed)
Attempted to contact patient in response to her MyChart message.  Prescription refills were sent to the pharmacy by Dr. Irene Limbo.  There are still no visitors allowed in the [facility]. Patients can bring anyone in to an appointment by calling them and keeping phone call open during appt. Please contact office for further questions or concerns.

## 2019-06-19 NOTE — Telephone Encounter (Signed)
Patient sent My Chart message requesting refills of 3 medications: "Need 3 refills please: 1. Fentanyl patches 2.Acyclovir 3.Oxycodone" Refills sent to Dr. Irene Limbo

## 2019-06-21 NOTE — Progress Notes (Signed)
HEMATOLOGY/ONCOLOGY CLINIC NOTE  Date of Service: 06/22/2019  Patient Care Team: Hoyt Koch, MD as PCP - General (Internal Medicine) Lafayette Dragon, MD (Inactive) as Consulting Physician (Gastroenterology) Megan Salon, MD as Consulting Physician (Gynecology) Melrose Nakayama, MD as Consulting Physician (Orthopedic Surgery) Deneise Lever, MD as Consulting Physician (Pulmonary Disease) [name], MD (Ophthalmology)  CHIEF COMPLAINTS/PURPOSE OF CONSULTATION:  myeloma  HISTORY OF PRESENTING ILLNESS:   Faith Orr is a wonderful 78 y.o. female who has been referred to [name] by Dr. Pricilla Holm for evaluation and management of Lytic bone lesions. She is accompanied today by her son in law, and her partner is present via phone. The pt reports that she is doing well overall.   The pt notes that 2-3 months ago while standing at the stove, and otherwise feeling normally, she felt "something snap that took her breath away" in her mid back as she stretched to get something. The pt notes that she saw a chiropractor twice due to her back stiffness, which did not help. The pt then described her new bone pains to her PCP on 12/05/18, and subsequent imaging, as noted below, revealed concerns for numerous bone lesions. She has begun 36m Fosamax. The pt notes that she had hip pain in 2018, and that an XR at that time did not reveal any lesions.  The pt notes that most of her pain is concentrated to her left shoulder presently, and with minimal movement of the arm. She endorses pain radiating into her left arm and notes that her hand is swollen in the mornings when she wakes up. She also endorses present back pain and right hip pain, worse when she walks. She has not yet seen orthopedics. The pt reports that she is needing to take 6038mAdvil every 4-6 hours as Tramadol alone has not been able to alleviate her pain. The pt denies any unexpected weight loss, fevers, chills, or night  sweats. The pt notes that her urine is a very dark color presently, but denies overt blood in the urine, underpants, nor tissue paper. The pt notes that in the last two weeks she has had some soreness in her head, but denies new headaches or changes in vision. The pt notes that she has been urinating more frequently overall, but has been trying to stay better hydrated as well.  The pt notes that she has been compliant with annual mammograms.   The pt notes that she had a cyst in her right breast which was removed in the past. She fractured her left wrist in 2008 after falling down stairs. The pt endorses history of fatty liver.   The pt denies ever smoking cigarettes and endorses significant second hand smoke exposure with a previous marriage.   Of note prior to the patient's visit today, pt has had a Bone Scan completed on 12/13/18 with results revealing Multifocal uptake throughout the skeleton, consistent with diffuse metastatic disease. Primary tumor is not specified. 2. Uptake in the proximal right femur, consistent with lytic lesions. 3. Uptake in the ribs bilaterally as described. 4. Lesions in the proximal left humerus. 5. Diffuse uptake throughout the skull consistent with metastatic disease. 6. Right paramedian uptake at the manubrium.  Most recent lab results (12/08/18) of CBC w/diff and CMP is as follows: all values are WNL except for Glucose at 279, BUN at 24, AST at 41, ALT at 46. 12/08/18 SPEP revealed all values WNL except for Total Protein at 6.0,  Albumin at 3.6, Gamma globulin at 0.7, and M spike at 0.5g  On review of systems, pt reports significant left shoulder pain, back pain, right hip pain, dark urine, and denies fevers, chills, night sweats, unexpected weight loss, changes in bowel habits, changes in breathing, cough, new respiratory symptoms, changes in vision, abdominal pains, leg swelling, and any other symptoms.   On PMHx the pt reports fatty liver, and denies blood clots.    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 Denies ever smoking.  On Family Hx the pt reports maternal grandmother with colon cancer. Father with bladder cancer and amyloidosis (pt notes that this could have been misdiagnosis). Mother with Protein S deficiency and polymyalgia rheumatica.  Interval History:   Faith Orr returns today for management and evaluation of her newly diagnosed Multiple myeloma and C6D1 treatment. The patient's last visit with [name] was on 05/25/19. The pt reports that she is doing well overall. She is accompanied today by her spouse, [name], via [employer].  The pt reports that she has not developed any new concerns in the interim. The pt denies any new right flank pain. She notes that her pain management has been satisfactory. She notes that her energy levels "somewhat affect" her quality of life.  Of note since the patient's last visit, pt has had a PET/CT completed on 06/01/19 with results revealing "Dominant lesion in the LEFT humerus is decreased significantly in metabolic activity. Additional hypermetabolic skeletal lytic lesions have decreased in metabolic activity or similar to comparison exam (01/05/2019). No evidence of disease progression. 2. Multiple additional lytic lesions do not have metabolic activity and unchanged. 3. No new skeletal lesions are identified. No soft tissue plasmacytoma identified. 4. Nodule / node in the LEFT parapharyngeal space which is intensely hypermetabolic not changed from prior. 5. New hypermetabolic LEFT lower lobe pulmonary nodule is indeterminate. Recommend close attention on follow-up 6. 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  On review of  systems, pt reports stable energy levels, eating well, and denies right flank pain, concerns for infections, new bone pains, and any other symptoms.   MEDICAL HISTORY:  Past Medical History:  Diagnosis Date   Allergy    seasonal   Asthma    DEPRESSION    DIABETES MELLITUS, TYPE II    Diverticulosis    HYPERLIPIDEMIA    Macular degeneration of left eye    mild, Dr.Hecker   Obesity, unspecified    Osteoarthritis of both knees    OSTEOPENIA    Osteopenia    URINARY INCONTINENCE     SURGICAL HISTORY: Past Surgical History:  Procedure Laterality Date   BREAST SURGERY     CATARACT EXTRACTION Left 05/24/2018   CESAREAN SECTION  01/1973   FRACTURE SURGERY     IR IMAGING GUIDED PORT INSERTION  02/20/2019   left wrist surgery  2008   By Dr. Latanya Maudlin   right ankle  1994    SOCIAL HISTORY: Social History   Socioeconomic History   Marital status: Married    Spouse name: Not on file   Number of children: 1   Years of education: Not on file   Highest education level: Not on file  Occupational History    Employer: [employer]  Social Needs   Financial resource strain: Patient refused   Food insecurity    Worry: Patient refused    Inability: Patient refused   Transportation needs    Medical: Patient refused    Non-medical: Patient refused  Tobacco Use   Smoking status: Never Smoker   Smokeless tobacco: Never Used   Tobacco comment: Lives with partner [name]) and son  Substance and Sexual Activity   Alcohol use: No    Alcohol/week: 0.0 standard drinks   Drug use: No   Sexual activity: Never    Partners: Female    Birth control/protection: Post-menopausal    Comment: Lives with female partner (annette hicks) and 48 yo son  Lifestyle   Physical activity    Days per week: Patient refused    Minutes per session: Patient refused   Stress: Patient refused  Relationships   Social connections    Talks on phone: Patient  refused    Gets together: Patient refused    Attends religious service: Patient refused    Active member of club or organization: Patient refused    Attends meetings of clubs or organizations: Patient refused    Relationship status: Patient refused   Intimate partner violence    Fear of current or ex partner: Patient refused    Emotionally abused: Patient refused    Physically abused: Patient refused    Forced sexual activity: Patient refused  Other Topics Concern   Not on file  Social History Narrative   Not on file    FAMILY HISTORY: Family History  Problem Relation Age of Onset   Diabetes Father    Hyperlipidemia Father    Heart disease Father    Cancer Father    Hypertension Father    Colon cancer Paternal Grandmother 4   Osteoporosis Mother    Protein S deficiency Mother    Hyperlipidemia Mother    Multiple sclerosis Daughter    Cancer Other        bladder   Breast cancer Neg Hx     ALLERGIES:  is allergic to penicillins; aleve [naproxen sodium]; and sulfonamide derivatives.  MEDICATIONS:  Current Outpatient Medications  Medication Sig Dispense Refill   acyclovir (ZOVIRAX) 400 MG tablet TAKE 1 TABLET(400 MG) BY MOUTH TWICE DAILY 60 tablet 0   albuterol (PROVENTIL HFA;VENTOLIN HFA) 108 (90 Base) MCG/ACT inhaler INHALE 1 TO 2 PUFFS INTO THE LUNGS EVERY 6 HOURS AS NEEDED FOR WHEEZING OR SHORTNESS OF BREATH (Patient not taking: No sig reported) 54 g 1   aspirin EC 81 MG tablet Take 81 mg by mouth daily after breakfast.      Blood Glucose Monitoring Suppl (FREESTYLE FREEDOM LITE) W/DEVICE KIT Use to check blood sugars twice a day Dx 250.00 1 each 0   Calcium Carbonate-Vitamin D (CALCIUM 600+D HIGH POTENCY) 600-400 MG-UNIT per tablet Take 1 tablet by mouth 2 (two) times daily.      Cetirizine HCl 10 MG CAPS Take 1 capsule (10 mg total) by mouth daily. 30 capsule 1   clotrimazole-betamethasone (LOTRISONE) cream Apply 1 application topically 2 (two)  times daily. Over left arm rash 30 g 0   Continuous Blood Gluc Sensor (FREESTYLE LIBRE 14 DAY SENSOR) MISC 1 each by Does not apply route every 14 (fourteen) days. 2 each 11   dexamethasone (DECADRON) 4 MG tablet Take 5 tablets (20 mg total) by mouth once a week. On D22 of each cycle of treatment  20 tablet 5   fentaNYL (DURAGESIC) 12 MCG/HR Place 1 patch onto the skin every 3 (three) days. 10 patch 0   fexofenadine (ALLEGRA) 60 MG tablet Take 1 tablet (60 mg total) by mouth 2 (two) times daily. 60 tablet 1   fluticasone (FLONASE) 50 MCG/ACT nasal spray Place 1 spray into both nostrils daily. (Patient taking differently: Place 1 spray into both nostrils daily as needed for allergies or rhinitis. ) 16 g 2   glipiZIDE (GLUCOTROL XL) 5 MG 24 hr tablet TAKE 1 TABLET(5 MG) BY MOUTH DAILY WITH BREAKFAST 90 tablet 1   glucose blood (FREESTYLE LITE) test strip CHECK BLOOD SUGAR TWICE DAILY AS DIRECTED Dx 250.00 180 each 3   Lancets (FREESTYLE) lancets Use twice daily to check sugars. 100 each 11   lenalidomide (REVLIMID) 15 MG capsule Take 1 capsule (15 mg total) by mouth daily. Take for 21 days on, 7 days off, repeat every 28 days. 21 capsule 0   lidocaine-prilocaine (EMLA) cream APPLY 1 APPLICATION TO THE AFFECTED AREA AS NEEDED. USE PRIOR TO PORT ACCESS 30 g 0   LORazepam (ATIVAN) 0.5 MG tablet Take 1 tablet (0.5 mg total) by mouth every 8 (eight) hours as needed for anxiety (significant essential tremors). 60 tablet 0   metFORMIN (GLUCOPHAGE-XR) 500 MG 24 hr tablet Take 3 tablets (1,500 mg total) by mouth daily with breakfast. (Patient taking differently: Take 500-1,000 mg by mouth See admin instructions. 500 mg every morning, 1,000 mg every night) 270 tablet 3   montelukast (SINGULAIR) 10 MG tablet TAKE 1 TABLET BY MOUTH DAILY AS NEEDED (Patient taking differently: Take 10 mg by mouth at bedtime as needed (allergies). ) 30 tablet 3   Multiple Vitamins-Minerals (ICAPS) CAPS Take 1 capsule by  mouth daily after breakfast.      ondansetron (ZOFRAN) 8 MG tablet Take 1 tablet (8 mg total) by mouth 2 (two) times daily as needed (Nausea or vomiting). 30 tablet 1   Oxycodone HCl 10 MG TABS Take 1 tablet (10 mg total) by mouth every 6 (six) hours as needed. 90 tablet 0   pantoprazole (PROTONIX) 20 MG tablet TAKE 1 TABLET(20 MG) BY MOUTH DAILY (Patient not taking: No sig reported) 30 tablet 5   polyethylene glycol (MIRALAX / GLYCOLAX) packet Take 17 g by mouth daily after breakfast.      prochlorperazine (COMPAZINE) 10 MG tablet Take 1 tablet (10 mg total) by mouth every 6 (six) hours as needed (Nausea or vomiting). 30 tablet 1   sertraline (ZOLOFT) 50 MG tablet TAKE 1 TABLET BY MOUTH DAILY 90 tablet 1   simvastatin (ZOCOR) 20 MG tablet TAKE 1 TABLET(20 MG) BY MOUTH DAILY 90 tablet 1   STOOL SOFTENER 100 MG capsule TAKE 2 CAPSULES BY MOUTH AT BEDTIME 60 capsule 1   Triamcinolone Acetonide 0.025 % LOTN Apply 1 application topically 3 (three) times daily as needed (rash/itching). 60 mL 2   Vitamin D, Ergocalciferol, (DRISDOL) 1.25 MG (50000 UT) CAPS capsule TAKE 1 CAPSULE BY MOUTH EVERY 7 DAYS 12 capsule 0   No current facility-administered medications for this visit.    Facility-Administered Medications Ordered in Other Visits  Medication Dose Route Frequency Provider Last Rate Last Dose   heparin lock flush 100 unit/mL  500 Units Intracatheter Once PRN Brunetta Genera, MD        REVIEW OF SYSTEMS:    A 10+ POINT REVIEW OF SYSTEMS WAS OBTAINED including neurology, dermatology, psychiatry, cardiac, respiratory, lymph, extremities, GI, GU, Musculoskeletal,  constitutional, breasts, reproductive, HEENT.  All pertinent positives are noted in the HPI.  All others are negative.   PHYSICAL EXAMINATION: ECOG PERFORMANCE STATUS: 2 - Symptomatic, <50% confined to bed  Vitals:   06/22/19 1358  BP: 128/76  Pulse: 99  Resp: 18  Temp: 98.6 F (37 C)  SpO2: 96%   Filed Weights    06/22/19 1358  Weight: 170 lb 8 oz (77.3 kg)   .Body mass index is 34.44 kg/m.  GENERAL:alert, in no acute distress and comfortable SKIN: no acute rashes, no significant lesions EYES: conjunctiva are pink and non-injected, sclera anicteric OROPHARYNX: MMM, no exudates, no oropharyngeal erythema or ulceration NECK: supple, no JVD LYMPH:  no palpable lymphadenopathy in the cervical, axillary or inguinal regions LUNGS: clear to auscultation b/l with normal respiratory effort HEART: regular rate & rhythm ABDOMEN:  normoactive bowel sounds , non tender, not distended. No palpable hepatosplenomegaly.  Extremity: no pedal edema PSYCH: alert & oriented x 3 with fluent speech NEURO: no focal motor/sensory deficits   LABORATORY DATA:  I have reviewed the data as listed  . CBC Latest Ref Rng & Units 06/22/2019 06/08/2019 06/01/2019  WBC 4.0 - 10.5 K/uL 8.4 8.1 8.1  Hemoglobin 12.0 - 15.0 g/dL 12.6 13.0 12.4  Hematocrit 36.0 - 46.0 % 37.5 38.9 38.5  Platelets 150 - 400 K/uL 237 184 173   . CBC    Component Value Date/Time   WBC 8.4 06/22/2019 1329   RBC 3.95 06/22/2019 1329   HGB 12.6 06/22/2019 1329   HGB 12.8 02/16/2019 1138   HCT 37.5 06/22/2019 1329   PLT 237 06/22/2019 1329   PLT 170 02/16/2019 1138   MCV 94.9 06/22/2019 1329   MCH 31.9 06/22/2019 1329   MCHC 33.6 06/22/2019 1329   RDW 13.9 06/22/2019 1329   LYMPHSABS 1.3 06/22/2019 1329   MONOABS 1.4 (H) 06/22/2019 1329   EOSABS 0.3 06/22/2019 1329   BASOSABS 0.1 06/22/2019 1329    . CMP Latest Ref Rng & Units 06/22/2019 06/08/2019 06/01/2019  Glucose 70 - 99 mg/dL 242(H) 173(H) 201(H)  BUN 8 - 23 mg/dL '14 14 13  ' Creatinine 0.44 - 1.00 mg/dL 0.74 0.75 0.76  Sodium 135 - 145 mmol/L 136 136 136  Potassium 3.5 - 5.1 mmol/L 3.6 3.5 3.8  Chloride 98 - 111 mmol/L 103 105 105  CO2 22 - 32 mmol/L 22 22 19(L)  Calcium 8.9 - 10.3 mg/dL 9.2 9.6 9.4  Total Protein 6.5 - 8.1 g/dL 6.3(L) 6.4(L) 6.4(L)  Total Bilirubin 0.3 - 1.2 mg/dL  0.5 0.6 0.6  Alkaline Phos 38 - 126 U/L 82 79 78  AST 15 - 41 U/L '16 15 21  ' ALT 0 - 44 U/L '18 20 22   ' Component     Latest Ref Rng & Units 12/27/2018  IgG (Immunoglobin G), Serum     700 - 1,600 mg/dL 801  IgA     64 - 422 mg/dL 91  IgM (Immunoglobulin M), Srm     26 - 217 mg/dL 15 (L)  Total Protein ELP     6.0 - 8.5 g/dL 6.6  Albumin SerPl Elph-Mcnc     2.9 - 4.4 g/dL 3.7  Alpha 1     0.0 - 0.4 g/dL 0.2  Alpha2 Glob SerPl Elph-Mcnc     0.4 - 1.0 g/dL 0.8  B-Globulin SerPl Elph-Mcnc     0.7 - 1.3 g/dL 1.2  Gamma Glob SerPl Elph-Mcnc     0.4 - 1.8 g/dL  0.7  M Protein SerPl Elph-Mcnc     Not Observed g/dL 0.5 (H)  Globulin, Total     2.2 - 3.9 g/dL 2.9  Albumin/Glob SerPl     0.7 - 1.7 1.3  IFE 1      Comment  Please Note (HCV):      Comment  Kappa free light chain     3.3 - 19.4 mg/L 5.1  Lamda free light chains     5.7 - 26.3 mg/L 40.3 (H)  Kappa, lamda light chain ratio     0.26 - 1.65 0.13 (L)  Beta-2 Microglobulin     0.6 - 2.4 mg/L 1.7  LDH     98 - 192 U/L 162  Sed Rate     0 - 22 mm/hr 8       01/06/19 Cytogenetics:      05/30/19 BM Biopsy:    RADIOGRAPHIC STUDIES: I have personally reviewed the radiological images as listed and agreed with the findings in the report. Nm Pet Image Restage (ps) Whole Body  Result Date: 06/01/2019 CLINICAL DATA:  Cystic treatment strategy for multiple myeloma. None transplant candidate. High risk multiple myeloma. Pre transplant evaluation. EXAM: NUCLEAR MEDICINE PET WHOLE BODY TECHNIQUE: 9.0 mCi F-18 FDG was injected intravenously. Full-ring PET imaging was performed from the skull base to thigh after the radiotracer. CT data was obtained and used for attenuation correction and anatomic localization. Fasting blood glucose: 148 mg/dl COMPARISON:  01/05/2019 FINDINGS: Mediastinal blood pool activity: SUV max 2.1 HEAD/NECK: Hypermetabolic nodule in the LEFT parapharyngeal space with SUV max equal 12.5 is unchanged  from SUV max equal 12.0. Lesion unchanged in size measuring 11.7 mm Incidental CT findings: none CHEST: No hypermetabolic mediastinal or hilar nodes. No suspicious pulmonary nodules on the CT scan. Incidental CT findings: New nodule in the LEFT lower lobe measures 12 mm with mild metabolic activity (SUV max equal 3.5, image 43/8) ABDOMEN/PELVIS: No abnormal hypermetabolic activity within the liver, pancreas, adrenal glands, or spleen. No hypermetabolic lymph nodes in the abdomen or pelvis. Incidental CT findings: Calculus at the RIGHT ureteropelvic junction measuring 7 mm is increased from 5 mm on prior and now is partially obstructing with mild pelvicaliectasis on the RIGHT. SKELETON: Lytic lesions within the skeleton with variable metabolic activity again demonstrated. Most intense lesion on comparison exam was the lesion the proximal LEFT humerus SUV max equal 8.1. This is improved considerably with SUV max now equal 3.5. Smaller lytic lesions have mild metabolic activity slightly less than comparison exam. For example lesion in the proximal RIGHT femur (image 214) with SUV max equal 3.5 decreased from 4.0. Lesion in the L5 spinous process with SUV max equal 2.8 compared to SUV max equal 3.8. Lesion in the T9 vertebral body with SUV max equal 4.7 compared to 5.1. Many lytic lesions not have associated metabolic activity such as lytic lesion in the LEFT iliac wing measuring 2 cm. Lesion not changed from comparison exam. Small lytic lesion in the posterior fourth rib also unchanged without metabolic activity. Incidental CT findings: none EXTREMITIES: No abnormal hypermetabolic activity in the lower extremities. Incidental CT findings: none IMPRESSION: 1. Dominant lesion in the LEFT humerus is decreased significantly in metabolic activity. Additional hypermetabolic skeletal lytic lesions have decreased in metabolic activity or similar to comparison exam (01/05/2019). No evidence of disease progression. 2. Multiple  additional lytic lesions do not have metabolic activity and unchanged. 3. No new skeletal lesions are identified. No soft tissue plasmacytoma identified. 4. Nodule /  node in the LEFT parapharyngeal space which is intensely hypermetabolic not changed from prior. 5. New hypermetabolic LEFT lower lobe pulmonary nodule is indeterminate. Recommend close attention on follow-up 6. New obstructive hydronephrosis of the RIGHT 123kidney related to RIGHT UPJ stone. Electronically Signed   By: Suzy Bouchard M.D.   On: 06/01/2019 08:18   Ct Biopsy  Result Date: 05/30/2019 CLINICAL DATA:  History of multiple myeloma not having achieved remission. Bone marrow biopsy requested to check status of disease after treatment. EXAM: CT GUIDED BONE MARROW ASPIRATION AND BIOPSY ANESTHESIA/SEDATION: Versed 2.0 mg IV, Fentanyl 100 mcg IV Total Moderate Sedation Time:   10 minutes. The patient's level of consciousness and physiologic status were continuously monitored during the procedure by Radiology nursing. PROCEDURE: The procedure risks, benefits, and alternatives were explained to the patient. Questions regarding the procedure were encouraged and answered. The patient understands and consents to the procedure. A time out was performed prior to initiating the procedure. The right gluteal region was prepped with chlorhexidine. Sterile gown and sterile gloves were used for the procedure. Local anesthesia was provided with 1% Lidocaine. Under CT guidance, an 11 gauge On Control bone cutting needle was advanced from a posterior approach into the right iliac bone. Needle positioning was confirmed with CT. Initial non heparinized and heparinized aspirate samples were obtained of bone marrow. Core biopsy was performed via the On Control drill needle. COMPLICATIONS: None FINDINGS: Inspection of initial aspirate did reveal visible particles. Intact core biopsy sample was obtained. IMPRESSION: CT guided bone marrow biopsy of right posterior  iliac bone with both aspirate and core samples obtained. Electronically Signed   By: Aletta Edouard M.D.   On: 05/30/2019 13:27   Ct Bone Marrow Biopsy & Aspiration  Result Date: 05/30/2019 CLINICAL DATA:  History of multiple myeloma not having achieved remission. Bone marrow biopsy requested to check status of disease after treatment. EXAM: CT GUIDED BONE MARROW ASPIRATION AND BIOPSY ANESTHESIA/SEDATION: Versed 2.0 mg IV, Fentanyl 100 mcg IV Total Moderate Sedation Time:   10 minutes. The patient's level of consciousness and physiologic status were continuously monitored during the procedure by Radiology nursing. PROCEDURE: The procedure risks, benefits, and alternatives were explained to the patient. Questions regarding the procedure were encouraged and answered. The patient understands and consents to the procedure. A time out was performed prior to initiating the procedure. The right gluteal region was prepped with chlorhexidine. Sterile gown and sterile gloves were used for the procedure. Local anesthesia was provided with 1% Lidocaine. Under CT guidance, an 11 gauge On Control bone cutting needle was advanced from a posterior approach into the right iliac bone. Needle positioning was confirmed with CT. Initial non heparinized and heparinized aspirate samples were obtained of bone marrow. Core biopsy was performed via the On Control drill needle. COMPLICATIONS: None FINDINGS: Inspection of initial aspirate did reveal visible particles. Intact core biopsy sample was obtained. IMPRESSION: CT guided bone marrow biopsy of right posterior iliac bone with both aspirate and core samples obtained. Electronically Signed   By: Aletta Edouard M.D.   On: 05/30/2019 13:27    ASSESSMENT & PLAN:   78 y.o. female with  1. Recently diagnosed Multiple Myeloma, RISS Stage III  Labs upon initial presentation from 12/08/18, blood counts are normal including WBC at 7.1k, HGB at 13.1, and PLT at 245k. Calcium normal at  10.3. Creatinine normal at 0.63. M spike at 0.5g. 12/13/18 Bone Scan revealed Multifocal uptake throughout the skeleton, consistent with diffuse metastatic disease. Primary  tumor is not specified. 2. Uptake in the proximal right femur, consistent with lytic lesions. 3. Uptake in the ribs bilaterally as described. 4. Lesions in the proximal left humerus. 5. Diffuse uptake throughout the skull consistent with metastatic disease. 6. Right paramedian uptake at the manubrium.  12/13/18 CT Right Femur revealed Numerous lytic lesions involving the right femur and a lytic lesion in the left inferior pubic ramus. Overall appearance is most concerning for multiple myeloma  12/27/18 Pretreatment 24hour UPEP observed an M spike at 63m, and showed 198mtotal protein/day.  12/27/18 Pretreatment MMP revealed M Protein at 0.5g with IgG Lambda specificity. Kappa:Lambda light chain ratio at 0.13, with Lambda at 40.3. There is less abnormal protein and light chains than I would expect from 30% plasma cells, which suggests hypo-secretory or non-secretory neoplastic plasma cells. Will have an impact in assessing response. 01/05/19 PET/CT revealed Innumerable lytic lesions in the skeleton compatible with myeloma. Most of the larger lesions are hypermetabolic, for example including a left proximal humeral shaft lesion with maximum SUV of 8.1 and a 2.8 cm lesion in the left T9 vertebral body with maximum SUV 5.1. Most of the smaller lytic lesions, and some of the larger lesions, do not demonstrate accentuated metabolic activity. 2. 1.2 cm in short axis lymph node in the left parapharyngeal space is hypermetabolic with maximum SUV 11.8. I do not see a separate mass in the head and neck to give rise to this hypermetabolic lymph node. 3. Mosaic attenuation in the lower lobes, nonspecific possibly from air trapping. 4.  Aortic Atherosclerosis 5. Heterogeneous activity in the liver, making it hard to exclude small liver lesions. Consider  hepatic protocol MRI with and without contrast for definitive assessment. Nonobstructive right nephrolithiasis. Old granulomatous disease  01/06/19 Bone Marrow biopsy revealed interstitial increase in plasma cells (28% aspirate, 40% CD138 immunohistochemistry). 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  01/17/19 MRI Liver revealed Several appreciable liver lesions all have benign imaging characteristics. No MRI findings of metastatic involvement of the liver. 2. Scattered bony lesions corresponding to the lytic lesions seen at PET-CT, compatible with active myeloma. 3. Aortic Atherosclerosis.  Mild cardiomegaly. 4. Diffuse hepatic steatosis.   3. Left lower lobe pulmonary nodule First seen on 06/01/19 PET/CT  PLAN -Discussed pt labwork today, 06/22/19; blood counts stable, glucose high at 242 and other chemistries are stable -Discussed the last available 06/08/19 Kappa/Lambda light chains which were normal. Last available 6/2/520 M Protein was stable at 0.2g -Discussed the 06/01/19 PET/CT which revealed "Dominant lesion in the LEFT humerus is decreased significantly in metabolic activity. Additional hypermetabolic skeletal lytic lesions have decreased in metabolic activity or similar to comparison exam (01/05/2019). No evidence of disease progression. 2. Multiple additional lytic lesions do not have metabolic activity and unchanged. 3. No new skeletal lesions are identified. No soft tissue plasmacytoma identified. 4. Nodule / node in the LEFT parapharyngeal space which is intensely hypermetabolic not changed from prior. 5. New hypermetabolic LEFT lower lobe pulmonary nodule is  indeterminate. Recommend close attention on follow-up 6. 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  -Discussed the 05/31/19 FISH which did not have enough material for analysis -Discussed that as the pt's initial BM Biopsy revealed plasma cell involvement in 40% range, and is now at 5% polytypic involvement, she has responded very well to treatment. 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  -Discussed that at this time the pt can choose to consider an autologous bone marrow transplant followed by maintenance treatment vs pursuing maintenance treatment.  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 Difficult to forecast disease behavior given presence of more than one mutation -Discussed possible maintenance treatments including Carfilzomib every 2 weeks vs continuing Carfilzomib and Revlimid. Would re-evaluate BM -Pt is not inclined to pursue transplant at this time but would like to have a discussion with Dr. Blinda Leatherwood at [facility] in the interim -Hold D15 and D16 (per pt preference) of this cycle and will plan to continue with C7 unless the pt tells me differently  -Recommend consuming 10oz glass of milk each day -Lotrisone for macerated skin rash on upper left extremity -Low dose of Ativan. Advised against using this to sleep or  before driving during first use. Discussed potential addictive properties and avoiding excessive use. -Continue 14m Claritin or Zyrtec and 254mPepcid for mild rashes, will continue to watch these -Adjusted premedications to add Tylenol and will split dose of Dexamethasone with pre-meds and pt will not take Decadron at home except on D22 -Okay to take Tylenol at home if feeling flushed, however pt will seek medical attention if fevers rise above 100.4 or repeat in frequency -Continue to minimize lifting, pulling, pushing with left upper extremity  -Avoid high impact activities and stairs to preserve right hip as well -Continue Fentanyl patch and 1087mxycodone for break through pain -Continue Zofran for opiate-related nausea -Continue Miralax once a day, and Senna S two tablets BID, back off as constipation resolves -Continue Zometa every 4 weeks -Continue Acyclovir and 46m39mpirin to mitigate risk of Shingles and blood clots respectively -Continue follow up with PCP Dr. ElizPricilla Holm management and evaluation of her DM, especially while on treatment with steroids as blood sugars will be higher -Will see the pt back with C7D1   Plz schedule remaining C6 of treatment ( I have cancel C6D15 and C6D16 per patient request) Plz schedule C7 of treatment with labs weekly. MD visit with C7D1Titonkalogy referral for rt PUJ stone ENT referral for parapharyngeal mass   All of the patients questions were answered with apparent satisfaction. The patient knows to call the clinic with any problems, questions or concerns.  The total time spent in the appt was 45 minutes and more than 50% was on counseling and direct patient cares.     GautSullivan LoneMS AAHIVMS SCH [facility]/Oncology Physician [facility]):       [phone] cell):  [phone]):           [phone] 3:25 PM  I, SchuBaldwin [place] acting as a scribe for Dr.  Sullivan Lone.     .I have reviewed the above documentation for accuracy and completeness, and I agree with the above. Brunetta Genera MD

## 2019-06-22 ENCOUNTER — Inpatient Hospital Stay: Payer: Medicare Other

## 2019-06-22 ENCOUNTER — Other Ambulatory Visit: Payer: Self-pay

## 2019-06-22 ENCOUNTER — Inpatient Hospital Stay: Payer: Medicare Other | Attending: Hematology

## 2019-06-22 ENCOUNTER — Inpatient Hospital Stay: Payer: Medicare Other | Admitting: Hematology

## 2019-06-22 VITALS — BP 128/76 | HR 99 | Temp 98.6°F | Resp 18 | Ht 59.0 in | Wt 170.5 lb

## 2019-06-22 DIAGNOSIS — C9000 Multiple myeloma not having achieved remission: Secondary | ICD-10-CM

## 2019-06-22 DIAGNOSIS — J392 Other diseases of pharynx: Secondary | ICD-10-CM | POA: Diagnosis not present

## 2019-06-22 DIAGNOSIS — Z95828 Presence of other vascular implants and grafts: Secondary | ICD-10-CM

## 2019-06-22 DIAGNOSIS — E119 Type 2 diabetes mellitus without complications: Secondary | ICD-10-CM | POA: Diagnosis not present

## 2019-06-22 DIAGNOSIS — Z800 Family history of malignant neoplasm of digestive organs: Secondary | ICD-10-CM

## 2019-06-22 DIAGNOSIS — Z5111 Encounter for antineoplastic chemotherapy: Secondary | ICD-10-CM

## 2019-06-22 DIAGNOSIS — R918 Other nonspecific abnormal finding of lung field: Secondary | ICD-10-CM | POA: Diagnosis not present

## 2019-06-22 DIAGNOSIS — E118 Type 2 diabetes mellitus with unspecified complications: Secondary | ICD-10-CM

## 2019-06-22 DIAGNOSIS — Z7189 Other specified counseling: Secondary | ICD-10-CM

## 2019-06-22 DIAGNOSIS — Z5112 Encounter for antineoplastic immunotherapy: Secondary | ICD-10-CM | POA: Diagnosis not present

## 2019-06-22 DIAGNOSIS — N201 Calculus of ureter: Secondary | ICD-10-CM | POA: Diagnosis not present

## 2019-06-22 DIAGNOSIS — C7951 Secondary malignant neoplasm of bone: Secondary | ICD-10-CM

## 2019-06-22 LAB — CBC WITH DIFFERENTIAL/PLATELET
Abs Immature Granulocytes: 0.11 10*3/uL — ABNORMAL HIGH (ref 0.00–0.07)
Basophils Absolute: 0.1 10*3/uL (ref 0.0–0.1)
Basophils Relative: 1 %
Eosinophils Absolute: 0.3 10*3/uL (ref 0.0–0.5)
Eosinophils Relative: 4 %
HCT: 37.5 % (ref 36.0–46.0)
Hemoglobin: 12.6 g/dL (ref 12.0–15.0)
Immature Granulocytes: 1 %
Lymphocytes Relative: 16 %
Lymphs Abs: 1.3 10*3/uL (ref 0.7–4.0)
MCH: 31.9 pg (ref 26.0–34.0)
MCHC: 33.6 g/dL (ref 30.0–36.0)
MCV: 94.9 fL (ref 80.0–100.0)
Monocytes Absolute: 1.4 10*3/uL — ABNORMAL HIGH (ref 0.1–1.0)
Monocytes Relative: 16 %
Neutro Abs: 5.2 10*3/uL (ref 1.7–7.7)
Neutrophils Relative %: 62 %
Platelets: 237 10*3/uL (ref 150–400)
RBC: 3.95 MIL/uL (ref 3.87–5.11)
RDW: 13.9 % (ref 11.5–15.5)
WBC: 8.4 10*3/uL (ref 4.0–10.5)
nRBC: 0 % (ref 0.0–0.2)

## 2019-06-22 LAB — CMP (CANCER CENTER ONLY)
ALT: 18 U/L (ref 0–44)
AST: 16 U/L (ref 15–41)
Albumin: 3.2 g/dL — ABNORMAL LOW (ref 3.5–5.0)
Alkaline Phosphatase: 82 U/L (ref 38–126)
Anion gap: 11 (ref 5–15)
BUN: 14 mg/dL (ref 8–23)
CO2: 22 mmol/L (ref 22–32)
Calcium: 9.2 mg/dL (ref 8.9–10.3)
Chloride: 103 mmol/L (ref 98–111)
Creatinine: 0.74 mg/dL (ref 0.44–1.00)
GFR, Est AFR Am: >60 mL/min (ref >60)
GFR, Estimated: >60 mL/min (ref >60)
Glucose, Bld: 242 mg/dL — ABNORMAL HIGH (ref 70–99)
Potassium: 3.6 mmol/L (ref 3.5–5.1)
Sodium: 136 mmol/L (ref 135–145)
Total Bilirubin: 0.5 mg/dL (ref 0.3–1.2)
Total Protein: 6.3 g/dL — ABNORMAL LOW (ref 6.5–8.1)

## 2019-06-22 MED ORDER — HEPARIN SOD (PORK) LOCK FLUSH 100 UNIT/ML IV SOLN
500.0000 [IU] | Freq: Once | INTRAVENOUS | Status: AC | PRN
  Administered 2019-06-22: 500 [IU]
  Filled 2019-06-22: qty 5

## 2019-06-22 MED ORDER — FAMOTIDINE 20 MG PO TABS
20.0000 mg | ORAL_TABLET | Freq: Once | ORAL | Status: AC
  Administered 2019-06-22: 20 mg via ORAL

## 2019-06-22 MED ORDER — DIPHENHYDRAMINE HCL 25 MG PO TABS
25.0000 mg | ORAL_TABLET | Freq: Once | ORAL | Status: AC
  Administered 2019-06-22: 16:00:00 25 mg via ORAL
  Filled 2019-06-22: qty 1

## 2019-06-22 MED ORDER — SODIUM CHLORIDE 0.9% FLUSH
10.0000 mL | INTRAVENOUS | Status: DC | PRN
  Administered 2019-06-22: 10 mL
  Filled 2019-06-22: qty 10

## 2019-06-22 MED ORDER — PROCHLORPERAZINE MALEATE 10 MG PO TABS
ORAL_TABLET | ORAL | Status: AC
  Filled 2019-06-22: qty 1

## 2019-06-22 MED ORDER — DIPHENHYDRAMINE HCL 25 MG PO CAPS
ORAL_CAPSULE | ORAL | Status: AC
  Filled 2019-06-22: qty 1

## 2019-06-22 MED ORDER — DEXAMETHASONE 4 MG PO TABS
20.0000 mg | ORAL_TABLET | Freq: Once | ORAL | Status: AC
  Administered 2019-06-22: 16:00:00 20 mg via ORAL

## 2019-06-22 MED ORDER — DEXTROSE 5 % IV SOLN
27.0000 mg/m2 | Freq: Once | INTRAVENOUS | Status: AC
  Administered 2019-06-22: 17:00:00 50 mg via INTRAVENOUS
  Filled 2019-06-22: qty 10

## 2019-06-22 MED ORDER — ACETAMINOPHEN 325 MG PO TABS
ORAL_TABLET | ORAL | Status: AC
  Filled 2019-06-22: qty 2

## 2019-06-22 MED ORDER — PROCHLORPERAZINE MALEATE 10 MG PO TABS
10.0000 mg | ORAL_TABLET | Freq: Once | ORAL | Status: AC
  Administered 2019-06-22: 10 mg via ORAL

## 2019-06-22 MED ORDER — DEXAMETHASONE 4 MG PO TABS
ORAL_TABLET | ORAL | Status: AC
  Filled 2019-06-22: qty 5

## 2019-06-22 MED ORDER — SODIUM CHLORIDE 0.9 % IV SOLN
Freq: Once | INTRAVENOUS | Status: AC
  Administered 2019-06-22: 16:00:00 via INTRAVENOUS
  Filled 2019-06-22: qty 250

## 2019-06-22 MED ORDER — ZOLEDRONIC ACID 4 MG/100ML IV SOLN
4.0000 mg | Freq: Once | INTRAVENOUS | Status: AC
  Administered 2019-06-22: 4 mg via INTRAVENOUS
  Filled 2019-06-22: qty 100

## 2019-06-22 MED ORDER — SODIUM CHLORIDE 0.9% FLUSH
10.0000 mL | Freq: Once | INTRAVENOUS | Status: AC
  Administered 2019-06-22: 10 mL
  Filled 2019-06-22: qty 10

## 2019-06-22 MED ORDER — FAMOTIDINE 20 MG PO TABS
ORAL_TABLET | ORAL | Status: AC
  Filled 2019-06-22: qty 1

## 2019-06-22 MED ORDER — ACETAMINOPHEN 325 MG PO TABS
650.0000 mg | ORAL_TABLET | Freq: Once | ORAL | Status: AC
  Administered 2019-06-22: 16:00:00 650 mg via ORAL

## 2019-06-22 NOTE — Patient Instructions (Signed)
[facility] Discharge Instructions for Patients Receiving Chemotherapy  Today you received the following chemotherapy agents :  Kyprolis,  Zometa.  To help prevent nausea and vomiting after your treatment, we encourage you to take your nausea medication as prescribed.   If you develop nausea and vomiting that is not controlled by your nausea medication, call the clinic.   BELOW ARE SYMPTOMS THAT SHOULD BE REPORTED IMMEDIATELY:  *FEVER GREATER THAN 100.5 F  *CHILLS WITH OR WITHOUT FEVER  NAUSEA AND VOMITING THAT IS NOT CONTROLLED WITH YOUR NAUSEA MEDICATION  *UNUSUAL SHORTNESS OF BREATH  *UNUSUAL BRUISING OR BLEEDING  TENDERNESS IN MOUTH AND THROAT WITH OR WITHOUT PRESENCE OF ULCERS  *URINARY PROBLEMS  *BOWEL PROBLEMS  UNUSUAL RASH Items with * indicate a potential emergency and should be followed up as soon as possible.  Feel free to call the clinic should you have any questions or concerns. The clinic phone number is [phone].  Please show the [place] at check-in to the Emergency Department and triage nurse.

## 2019-06-23 ENCOUNTER — Other Ambulatory Visit: Payer: Self-pay

## 2019-06-23 ENCOUNTER — Encounter: Payer: Self-pay | Admitting: Hematology

## 2019-06-23 ENCOUNTER — Telehealth: Payer: Self-pay | Admitting: Hematology

## 2019-06-23 ENCOUNTER — Inpatient Hospital Stay: Payer: Medicare Other

## 2019-06-23 VITALS — BP 138/70 | HR 97 | Temp 98.9°F | Resp 20

## 2019-06-23 DIAGNOSIS — Z5112 Encounter for antineoplastic immunotherapy: Secondary | ICD-10-CM | POA: Diagnosis not present

## 2019-06-23 DIAGNOSIS — Z7189 Other specified counseling: Secondary | ICD-10-CM

## 2019-06-23 DIAGNOSIS — C9000 Multiple myeloma not having achieved remission: Secondary | ICD-10-CM

## 2019-06-23 LAB — HEMOGLOBIN A1C
Hgb A1c MFr Bld: 5.8 % — ABNORMAL HIGH (ref 4.8–5.6)
Mean Plasma Glucose: 120 mg/dL

## 2019-06-23 MED ORDER — DEXAMETHASONE 4 MG PO TABS
ORAL_TABLET | ORAL | Status: AC
  Filled 2019-06-23: qty 5

## 2019-06-23 MED ORDER — FAMOTIDINE 20 MG PO TABS
ORAL_TABLET | ORAL | Status: AC
  Filled 2019-06-23: qty 1

## 2019-06-23 MED ORDER — ACETAMINOPHEN 325 MG PO TABS
650.0000 mg | ORAL_TABLET | Freq: Once | ORAL | Status: AC
  Administered 2019-06-23: 650 mg via ORAL

## 2019-06-23 MED ORDER — SODIUM CHLORIDE 0.9 % IV SOLN
Freq: Once | INTRAVENOUS | Status: AC
  Administered 2019-06-23: 14:00:00 via INTRAVENOUS
  Filled 2019-06-23: qty 250

## 2019-06-23 MED ORDER — PROCHLORPERAZINE MALEATE 10 MG PO TABS
10.0000 mg | ORAL_TABLET | Freq: Once | ORAL | Status: AC
  Administered 2019-06-23: 10 mg via ORAL

## 2019-06-23 MED ORDER — DEXTROSE 5 % IV SOLN
27.0000 mg/m2 | Freq: Once | INTRAVENOUS | Status: AC
  Administered 2019-06-23: 50 mg via INTRAVENOUS
  Filled 2019-06-23: qty 10

## 2019-06-23 MED ORDER — DIPHENHYDRAMINE HCL 25 MG PO CAPS
ORAL_CAPSULE | ORAL | Status: AC
  Filled 2019-06-23: qty 1

## 2019-06-23 MED ORDER — SODIUM CHLORIDE 0.9 % IV SOLN
Freq: Once | INTRAVENOUS | Status: AC
  Administered 2019-06-23: 13:00:00 via INTRAVENOUS
  Filled 2019-06-23: qty 250

## 2019-06-23 MED ORDER — SODIUM CHLORIDE 0.9% FLUSH
10.0000 mL | INTRAVENOUS | Status: DC | PRN
  Administered 2019-06-23: 10 mL
  Filled 2019-06-23: qty 10

## 2019-06-23 MED ORDER — DIPHENHYDRAMINE HCL 25 MG PO TABS
25.0000 mg | ORAL_TABLET | Freq: Once | ORAL | Status: AC
  Administered 2019-06-23: 25 mg via ORAL
  Filled 2019-06-23: qty 1

## 2019-06-23 MED ORDER — ACETAMINOPHEN 325 MG PO TABS
ORAL_TABLET | ORAL | Status: AC
  Filled 2019-06-23: qty 2

## 2019-06-23 MED ORDER — FAMOTIDINE 20 MG PO TABS
20.0000 mg | ORAL_TABLET | Freq: Once | ORAL | Status: AC
  Administered 2019-06-23: 13:00:00 20 mg via ORAL

## 2019-06-23 MED ORDER — PROCHLORPERAZINE MALEATE 10 MG PO TABS
ORAL_TABLET | ORAL | Status: AC
  Filled 2019-06-23: qty 1

## 2019-06-23 MED ORDER — DEXAMETHASONE 4 MG PO TABS
20.0000 mg | ORAL_TABLET | Freq: Once | ORAL | Status: AC
  Administered 2019-06-23: 20 mg via ORAL

## 2019-06-23 MED ORDER — HEPARIN SOD (PORK) LOCK FLUSH 100 UNIT/ML IV SOLN
500.0000 [IU] | Freq: Once | INTRAVENOUS | Status: AC | PRN
  Administered 2019-06-23: 500 [IU]
  Filled 2019-06-23: qty 5

## 2019-06-23 NOTE — Telephone Encounter (Deleted)
Scheduled appt per 7/9 los.  Left a voice message of appt date and time.

## 2019-06-23 NOTE — Patient Instructions (Signed)
[facility] Discharge Instructions for Patients Receiving Chemotherapy  Today you received the following chemotherapy agents: Carfilzomib (Kyprolis)  To help prevent nausea and vomiting after your treatment, we encourage you to take your nausea medication as directed.    If you develop nausea and vomiting that is not controlled by your nausea medication, call the clinic.   BELOW ARE SYMPTOMS THAT SHOULD BE REPORTED IMMEDIATELY:  *FEVER GREATER THAN 100.5 F  *CHILLS WITH OR WITHOUT FEVER  NAUSEA AND VOMITING THAT IS NOT CONTROLLED WITH YOUR NAUSEA MEDICATION  *UNUSUAL SHORTNESS OF BREATH  *UNUSUAL BRUISING OR BLEEDING  TENDERNESS IN MOUTH AND THROAT WITH OR WITHOUT PRESENCE OF ULCERS  *URINARY PROBLEMS  *BOWEL PROBLEMS  UNUSUAL RASH Items with * indicate a potential emergency and should be followed up as soon as possible.  Feel free to call the clinic should you have any questions or concerns. The clinic phone number is [phone].  Please show the CHEMO ALERT CARD at check-in to the Emergency Department and triage nurse.  Coronavirus (COVID-19) Are you at risk?  Are you at risk for the Coronavirus (COVID-19)?  To be considered HIGH RISK for Coronavirus (COVID-19), you have to meet the following criteria:  . Traveled to [place], [place], [name], [place] or [place]; or in the [place] to [place], [place], [place], or [place]; and have fever, cough, and shortness of breath within the last 2 weeks of travel OR . Been in close contact with a person diagnosed with COVID-19 within the last 2 weeks and have fever, cough, and shortness of breath . IF YOU DO NOT MEET THESE CRITERIA, YOU ARE CONSIDERED LOW RISK FOR COVID-19.  What to do if you are HIGH RISK for COVID-19?  . If you are having a medical emergency, call 911. . Seek medical care right away. Before you go to a doctor's office, urgent care or emergency department, call ahead and tell them  about your recent travel, contact with someone diagnosed with COVID-19, and your symptoms. You should receive instructions from your physician's office regarding next steps of care.  . When you arrive at healthcare provider, tell the healthcare staff immediately you have returned from visiting [place], [place], [place], [place] or [name]; or traveled in the [place] to [place], [place], [place], or [place]; in the last two weeks or you have been in close contact with a person diagnosed with COVID-19 in the last 2 weeks.   . Tell the health care staff about your symptoms: fever, cough and shortness of breath. . After you have been seen by a medical provider, you will be either: o Tested for (COVID-19) and discharged home on quarantine except to seek medical care if symptoms worsen, and asked to  - Stay home and avoid contact with others until you get your results (4-5 days)  - Avoid travel on public transportation if possible (such as bus, train, or airplane) or o Sent to the Emergency Department by EMS for evaluation, COVID-19 testing, and possible admission depending on your condition and test results.  What to do if you are LOW RISK for COVID-19?  Reduce your risk of any infection by using the same precautions used for avoiding the common cold or flu:  . Wash your hands often with soap and warm water for at least 20 seconds.  If soap and water are not readily available, use an alcohol-based hand sanitizer with at least 60% alcohol.  . If coughing   or sneezing, cover your mouth and nose by coughing or sneezing into the elbow areas of your shirt or coat, into a tissue or into your sleeve (not your hands). . Avoid shaking hands with others and consider head nods or verbal greetings only. . Avoid touching your eyes, nose, or mouth with unwashed hands.  . Avoid close contact with people who are sick. . Avoid places or events with large numbers of people in one location, like concerts or  sporting events. . Carefully consider travel plans you have or are making. . If you are planning any travel outside or inside the [place], visit the CDC's Travelers' Health webpage for the latest health notices. . If you have some symptoms but not all symptoms, continue to monitor at home and seek medical attention if your symptoms worsen. . If you are having a medical emergency, call 911.   ADDITIONAL HEALTHCARE OPTIONS FOR PATIENTS  [facility] / e-Visit: [email or web address]         [facility]: [phone]  [facility]: [phone]                   [facility]: [phone]   

## 2019-06-23 NOTE — Telephone Encounter (Signed)
Scheduled appt per 7/9 los.  Left a voice message of appt date and time.  No referrals have been put in.

## 2019-06-27 ENCOUNTER — Encounter: Payer: Self-pay | Admitting: Hematology

## 2019-06-29 ENCOUNTER — Inpatient Hospital Stay: Payer: Medicare Other

## 2019-06-29 ENCOUNTER — Other Ambulatory Visit: Payer: Self-pay

## 2019-06-29 VITALS — BP 120/75 | HR 98 | Temp 98.4°F | Resp 18

## 2019-06-29 DIAGNOSIS — C9000 Multiple myeloma not having achieved remission: Secondary | ICD-10-CM

## 2019-06-29 DIAGNOSIS — Z95828 Presence of other vascular implants and grafts: Secondary | ICD-10-CM

## 2019-06-29 DIAGNOSIS — Z5112 Encounter for antineoplastic immunotherapy: Secondary | ICD-10-CM | POA: Diagnosis not present

## 2019-06-29 DIAGNOSIS — Z7189 Other specified counseling: Secondary | ICD-10-CM

## 2019-06-29 LAB — CMP (CANCER CENTER ONLY)
ALT: 19 U/L (ref 0–44)
AST: 14 U/L — ABNORMAL LOW (ref 15–41)
Albumin: 3.2 g/dL — ABNORMAL LOW (ref 3.5–5.0)
Alkaline Phosphatase: 78 U/L (ref 38–126)
Anion gap: 13 (ref 5–15)
BUN: 17 mg/dL (ref 8–23)
CO2: 20 mmol/L — ABNORMAL LOW (ref 22–32)
Calcium: 9.7 mg/dL (ref 8.9–10.3)
Chloride: 105 mmol/L (ref 98–111)
Creatinine: 0.78 mg/dL (ref 0.44–1.00)
GFR, Est AFR Am: >60 mL/min (ref >60)
GFR, Estimated: >60 mL/min (ref >60)
Glucose, Bld: 221 mg/dL — ABNORMAL HIGH (ref 70–99)
Potassium: 3.7 mmol/L (ref 3.5–5.1)
Sodium: 138 mmol/L (ref 135–145)
Total Bilirubin: 0.7 mg/dL (ref 0.3–1.2)
Total Protein: 6.3 g/dL — ABNORMAL LOW (ref 6.5–8.1)

## 2019-06-29 LAB — CBC WITH DIFFERENTIAL/PLATELET
Abs Immature Granulocytes: 0.19 10*3/uL — ABNORMAL HIGH (ref 0.00–0.07)
Basophils Absolute: 0.1 10*3/uL (ref 0.0–0.1)
Basophils Relative: 1 %
Eosinophils Absolute: 0.5 10*3/uL (ref 0.0–0.5)
Eosinophils Relative: 4 %
HCT: 40 % (ref 36.0–46.0)
Hemoglobin: 13.3 g/dL (ref 12.0–15.0)
Immature Granulocytes: 2 %
Lymphocytes Relative: 13 %
Lymphs Abs: 1.4 10*3/uL (ref 0.7–4.0)
MCH: 31.6 pg (ref 26.0–34.0)
MCHC: 33.3 g/dL (ref 30.0–36.0)
MCV: 95 fL (ref 80.0–100.0)
Monocytes Absolute: 2.6 10*3/uL — ABNORMAL HIGH (ref 0.1–1.0)
Monocytes Relative: 24 %
Neutro Abs: 5.9 10*3/uL (ref 1.7–7.7)
Neutrophils Relative %: 56 %
Platelets: 165 10*3/uL (ref 150–400)
RBC: 4.21 MIL/uL (ref 3.87–5.11)
RDW: 13.8 % (ref 11.5–15.5)
WBC: 10.7 10*3/uL — ABNORMAL HIGH (ref 4.0–10.5)
nRBC: 0 % (ref 0.0–0.2)

## 2019-06-29 MED ORDER — SODIUM CHLORIDE 0.9% FLUSH
10.0000 mL | INTRAVENOUS | Status: DC | PRN
  Administered 2019-06-29: 10 mL
  Filled 2019-06-29: qty 10

## 2019-06-29 MED ORDER — SODIUM CHLORIDE 0.9 % IV SOLN
Freq: Once | INTRAVENOUS | Status: AC
  Administered 2019-06-29: 13:00:00 via INTRAVENOUS
  Filled 2019-06-29: qty 250

## 2019-06-29 MED ORDER — DEXTROSE 5 % IV SOLN
27.0000 mg/m2 | Freq: Once | INTRAVENOUS | Status: AC
  Administered 2019-06-29: 14:00:00 50 mg via INTRAVENOUS
  Filled 2019-06-29: qty 10

## 2019-06-29 MED ORDER — PROCHLORPERAZINE MALEATE 10 MG PO TABS
10.0000 mg | ORAL_TABLET | Freq: Once | ORAL | Status: AC
  Administered 2019-06-29: 10 mg via ORAL

## 2019-06-29 MED ORDER — ZOLEDRONIC ACID 4 MG/100ML IV SOLN: 4.0000 mg | Freq: Once | INTRAVENOUS | Status: DC

## 2019-06-29 MED ORDER — HEPARIN SOD (PORK) LOCK FLUSH 100 UNIT/ML IV SOLN
500.0000 [IU] | Freq: Once | INTRAVENOUS | Status: AC | PRN
  Administered 2019-06-29: 500 [IU]
  Filled 2019-06-29: qty 5

## 2019-06-29 MED ORDER — PROCHLORPERAZINE MALEATE 10 MG PO TABS
ORAL_TABLET | ORAL | Status: AC
  Filled 2019-06-29: qty 1

## 2019-06-29 MED ORDER — DEXAMETHASONE 4 MG PO TABS
ORAL_TABLET | ORAL | Status: AC
  Filled 2019-06-29: qty 5

## 2019-06-29 MED ORDER — SODIUM CHLORIDE 0.9 % IV SOLN
Freq: Once | INTRAVENOUS | Status: DC
  Filled 2019-06-29: qty 250

## 2019-06-29 MED ORDER — ACETAMINOPHEN 325 MG PO TABS
ORAL_TABLET | ORAL | Status: AC
  Filled 2019-06-29: qty 2

## 2019-06-29 MED ORDER — SODIUM CHLORIDE 0.9% FLUSH
10.0000 mL | Freq: Once | INTRAVENOUS | Status: AC
  Administered 2019-06-29: 10 mL
  Filled 2019-06-29: qty 10

## 2019-06-29 MED ORDER — FAMOTIDINE 20 MG PO TABS: 20.0000 mg | ORAL_TABLET | Freq: Once | ORAL | Status: DC

## 2019-06-29 MED ORDER — DIPHENHYDRAMINE HCL 25 MG PO TABS
25.0000 mg | ORAL_TABLET | Freq: Once | ORAL | Status: DC
  Filled 2019-06-29: qty 1

## 2019-06-29 MED ORDER — ACETAMINOPHEN 325 MG PO TABS
650.0000 mg | ORAL_TABLET | Freq: Once | ORAL | Status: AC
  Administered 2019-06-29: 650 mg via ORAL

## 2019-06-29 MED ORDER — DEXAMETHASONE 4 MG PO TABS
20.0000 mg | ORAL_TABLET | Freq: Once | ORAL | Status: AC
  Administered 2019-06-29: 20 mg via ORAL

## 2019-06-29 NOTE — Patient Instructions (Signed)
[facility] Discharge Instructions for Patients Receiving Chemotherapy  Today you received the following chemotherapy agents: Carfilzomib (Kyprolis)  To help prevent nausea and vomiting after your treatment, we encourage you to take your nausea medication as directed.    If you develop nausea and vomiting that is not controlled by your nausea medication, call the clinic.   BELOW ARE SYMPTOMS THAT SHOULD BE REPORTED IMMEDIATELY:  *FEVER GREATER THAN 100.5 F  *CHILLS WITH OR WITHOUT FEVER  NAUSEA AND VOMITING THAT IS NOT CONTROLLED WITH YOUR NAUSEA MEDICATION  *UNUSUAL SHORTNESS OF BREATH  *UNUSUAL BRUISING OR BLEEDING  TENDERNESS IN MOUTH AND THROAT WITH OR WITHOUT PRESENCE OF ULCERS  *URINARY PROBLEMS  *BOWEL PROBLEMS  UNUSUAL RASH Items with * indicate a potential emergency and should be followed up as soon as possible.  Feel free to call the clinic should you have any questions or concerns. The clinic phone number is [phone].  Please show the CHEMO ALERT CARD at check-in to the Emergency Department and triage nurse.   

## 2019-06-30 ENCOUNTER — Inpatient Hospital Stay: Payer: Medicare Other

## 2019-06-30 ENCOUNTER — Other Ambulatory Visit: Payer: Self-pay

## 2019-06-30 ENCOUNTER — Other Ambulatory Visit: Payer: Self-pay | Admitting: *Deleted

## 2019-06-30 VITALS — BP 141/66 | HR 91 | Temp 99.1°F | Resp 18 | Wt 172.8 lb

## 2019-06-30 DIAGNOSIS — Z7189 Other specified counseling: Secondary | ICD-10-CM

## 2019-06-30 DIAGNOSIS — Z5112 Encounter for antineoplastic immunotherapy: Secondary | ICD-10-CM | POA: Diagnosis not present

## 2019-06-30 DIAGNOSIS — C9000 Multiple myeloma not having achieved remission: Secondary | ICD-10-CM

## 2019-06-30 MED ORDER — ACETAMINOPHEN 325 MG PO TABS
650.0000 mg | ORAL_TABLET | Freq: Once | ORAL | Status: AC
  Administered 2019-06-30: 650 mg via ORAL

## 2019-06-30 MED ORDER — LENALIDOMIDE 15 MG PO CAPS: 15.0000 mg | ORAL_CAPSULE | Freq: Every day | ORAL | 0 refills | Status: DC

## 2019-06-30 MED ORDER — DEXAMETHASONE 4 MG PO TABS
ORAL_TABLET | ORAL | Status: AC
  Filled 2019-06-30: qty 5

## 2019-06-30 MED ORDER — HEPARIN SOD (PORK) LOCK FLUSH 100 UNIT/ML IV SOLN
500.0000 [IU] | Freq: Once | INTRAVENOUS | Status: AC | PRN
  Administered 2019-06-30: 500 [IU]
  Filled 2019-06-30: qty 5

## 2019-06-30 MED ORDER — FAMOTIDINE 20 MG PO TABS
20.0000 mg | ORAL_TABLET | Freq: Once | ORAL | Status: AC
  Administered 2019-06-30: 20 mg via ORAL

## 2019-06-30 MED ORDER — SODIUM CHLORIDE 0.9 % IV SOLN
Freq: Once | INTRAVENOUS | Status: AC
  Administered 2019-06-30: 13:00:00 via INTRAVENOUS
  Filled 2019-06-30: qty 250

## 2019-06-30 MED ORDER — DEXAMETHASONE 4 MG PO TABS
20.0000 mg | ORAL_TABLET | Freq: Once | ORAL | Status: AC
  Administered 2019-06-30: 20 mg via ORAL

## 2019-06-30 MED ORDER — PROCHLORPERAZINE MALEATE 10 MG PO TABS
10.0000 mg | ORAL_TABLET | Freq: Once | ORAL | Status: AC
  Administered 2019-06-30: 10 mg via ORAL

## 2019-06-30 MED ORDER — DEXTROSE 5 % IV SOLN
27.0000 mg/m2 | Freq: Once | INTRAVENOUS | Status: AC
  Administered 2019-06-30: 50 mg via INTRAVENOUS
  Filled 2019-06-30: qty 10

## 2019-06-30 MED ORDER — SODIUM CHLORIDE 0.9 % IV SOLN
Freq: Once | INTRAVENOUS | Status: AC
  Administered 2019-06-30: 14:00:00 via INTRAVENOUS
  Filled 2019-06-30: qty 250

## 2019-06-30 MED ORDER — ACETAMINOPHEN 325 MG PO TABS
ORAL_TABLET | ORAL | Status: AC
  Filled 2019-06-30: qty 2

## 2019-06-30 MED ORDER — SODIUM CHLORIDE 0.9% FLUSH
10.0000 mL | INTRAVENOUS | Status: DC | PRN
  Administered 2019-06-30: 10 mL
  Filled 2019-06-30: qty 10

## 2019-06-30 MED ORDER — PROCHLORPERAZINE MALEATE 10 MG PO TABS
ORAL_TABLET | ORAL | Status: AC
  Filled 2019-06-30: qty 1

## 2019-06-30 MED ORDER — FAMOTIDINE 20 MG PO TABS
ORAL_TABLET | ORAL | Status: AC
  Filled 2019-06-30: qty 1

## 2019-06-30 MED ORDER — DIPHENHYDRAMINE HCL 25 MG PO CAPS
ORAL_CAPSULE | ORAL | Status: AC
  Filled 2019-06-30: qty 1

## 2019-06-30 MED ORDER — DIPHENHYDRAMINE HCL 25 MG PO CAPS
25.0000 mg | ORAL_CAPSULE | Freq: Once | ORAL | Status: AC
  Administered 2019-06-30: 25 mg via ORAL

## 2019-06-30 NOTE — Patient Instructions (Signed)
[facility] Discharge Instructions for Patients Receiving Chemotherapy  Today you received the following chemotherapy agents Kyprolis To help prevent nausea and vomiting after your treatment, we encourage you to take your nausea medicationas directed by your MD.   If you develop nausea and vomiting that is not controlled by your nausea medication, call the clinic.   BELOW ARE SYMPTOMS THAT SHOULD BE REPORTED IMMEDIATELY:  *FEVER GREATER THAN 100.5 F  *CHILLS WITH OR WITHOUT FEVER  NAUSEA AND VOMITING THAT IS NOT CONTROLLED WITH YOUR NAUSEA MEDICATION  *UNUSUAL SHORTNESS OF BREATH  *UNUSUAL BRUISING OR BLEEDING  TENDERNESS IN MOUTH AND THROAT WITH OR WITHOUT PRESENCE OF ULCERS  *URINARY PROBLEMS  *BOWEL PROBLEMS  UNUSUAL RASH Items with * indicate a potential emergency and should be followed up as soon as possible.  Feel free to call the clinic should you have any questions or concerns. The clinic phone number is [phone].  Please show the [place] at check-in to the Emergency Department and triage nurse.  Coronavirus (COVID-19) Are you at risk?  Are you at risk for the Coronavirus (COVID-19)?  To be considered HIGH RISK for Coronavirus (COVID-19), you have to meet the following criteria:  . Traveled to [place], [place], [place], [place] or [place]; or in the [place] to [place], [place], [place], or [place]; and have fever, cough, and shortness of breath within the last 2 weeks of travel OR . Been in close contact with a person diagnosed with COVID-19 within the last 2 weeks and have fever, cough, and shortness of breath . IF YOU DO NOT MEET THESE CRITERIA, YOU ARE CONSIDERED LOW RISK FOR COVID-19.  What to do if you are HIGH RISK for COVID-19?  Marland Kitchen If you are having a medical emergency, call 911. . Seek medical care right away. Before you go to a doctor's office, urgent care or emergency department, call ahead and tell them about  your recent travel, contact with someone diagnosed with COVID-19, and your symptoms. You should receive instructions from your physician's office regarding next steps of care.  . When you arrive at healthcare provider, tell the healthcare staff immediately you have returned from visiting [place], [place], [place], [place] or [place]; or traveled in the [place] to [place], [place], [place], or [place]; in the last two weeks or you have been in close contact with a person diagnosed with COVID-19 in the last 2 weeks.   . Tell the health care staff about your symptoms: fever, cough and shortness of breath. . After you have been seen by a medical provider, you will be either: o Tested for (COVID-19) and discharged home on quarantine except to seek medical care if symptoms worsen, and asked to  - Stay home and avoid contact with others until you get your results (4-5 days)  - Avoid travel on public transportation if possible (such as bus, train, or airplane) or o Sent to the Emergency Department by EMS for evaluation, COVID-19 testing, and possible admission depending on your condition and test results.  What to do if you are LOW RISK for COVID-19?  Reduce your risk of any infection by using the same precautions used for avoiding the common cold or flu:  Marland Kitchen Wash your hands often with soap and warm water for at least 20 seconds.  If soap and water are not readily available, use an alcohol-based hand sanitizer with at least 60% alcohol.  . If coughing or  sneezing, cover your mouth and nose by coughing or sneezing into the elbow areas of your shirt or coat, into a tissue or into your sleeve (not your hands). . Avoid shaking hands with others and consider head nods or verbal greetings only. . Avoid touching your eyes, nose, or mouth with unwashed hands.  . Avoid close contact with people who are sick. . Avoid places or events with large numbers of people in one location, like concerts or sporting  events. . Carefully consider travel plans you have or are making. . If you are planning any travel outside or inside the [name], visit the CDC's Travelers' Health webpage for the latest health notices. . If you have some symptoms but not all symptoms, continue to monitor at home and seek medical attention if your symptoms worsen. . If you are having a medical emergency, call 911.   [place] / e-Visit: [email or web address]         [facility]: [facility]: [phone]                   [facility]: [phone]

## 2019-06-30 NOTE — Telephone Encounter (Signed)
Refill Revlimid per Dr.Kale OV note 06/22/2019. Refill sent to [facility]) [place], [place] # [phone], 06/30/2019

## 2019-07-06 ENCOUNTER — Other Ambulatory Visit: Payer: Medicare Other

## 2019-07-06 ENCOUNTER — Ambulatory Visit: Payer: Medicare Other

## 2019-07-07 ENCOUNTER — Other Ambulatory Visit: Payer: Self-pay | Admitting: Internal Medicine

## 2019-07-07 ENCOUNTER — Ambulatory Visit: Payer: Medicare Other

## 2019-07-19 ENCOUNTER — Other Ambulatory Visit: Payer: Self-pay | Admitting: Hematology

## 2019-07-19 NOTE — Progress Notes (Signed)
HEMATOLOGY/ONCOLOGY CLINIC NOTE  Date of Service: 07/20/2019  Patient Care Team: Hoyt Koch, MD as PCP - General (Internal Medicine) Lafayette Dragon, MD (Inactive) as Consulting Physician (Gastroenterology) Megan Salon, MD as Consulting Physician (Gynecology) Melrose Nakayama, MD as Consulting Physician (Orthopedic Surgery) Deneise Lever, MD as Consulting Physician (Pulmonary Disease) [name], MD (Ophthalmology)  CHIEF COMPLAINTS/PURPOSE OF CONSULTATION:  myeloma  HISTORY OF PRESENTING ILLNESS:   Faith Orr is a wonderful 78 y.o. female who has been referred to [name] by Dr. Pricilla Holm for evaluation and management of Lytic bone lesions. She is accompanied today by her son in law, and her partner is present via phone. The pt reports that she is doing well overall.   The pt notes that 2-3 months ago while standing at the stove, and otherwise feeling normally, she felt "something snap that took her breath away" in her mid back as she stretched to get something. The pt notes that she saw a chiropractor twice due to her back stiffness, which did not help. The pt then described her new bone pains to her PCP on 12/05/18, and subsequent imaging, as noted below, revealed concerns for numerous bone lesions. She has begun 62m Fosamax. The pt notes that she had hip pain in 2018, and that an XR at that time did not reveal any lesions.  The pt notes that most of her pain is concentrated to her left shoulder presently, and with minimal movement of the arm. She endorses pain radiating into her left arm and notes that her hand is swollen in the mornings when she wakes up. She also endorses present back pain and right hip pain, worse when she walks. She has not yet seen orthopedics. The pt reports that she is needing to take 602mAdvil every 4-6 hours as Tramadol alone has not been able to alleviate her pain. The pt denies any unexpected weight loss, fevers, chills, or night  sweats. The pt notes that her urine is a very dark color presently, but denies overt blood in the urine, underpants, nor tissue paper. The pt notes that in the last two weeks she has had some soreness in her head, but denies new headaches or changes in vision. The pt notes that she has been urinating more frequently overall, but has been trying to stay better hydrated as well.  The pt notes that she has been compliant with annual mammograms.   The pt notes that she had a cyst in her right breast which was removed in the past. She fractured her left wrist in 2008 after falling down stairs. The pt endorses history of fatty liver.   The pt denies ever smoking cigarettes and endorses significant second hand smoke exposure with a previous marriage.   Of note prior to the patient's visit today, pt has had a Bone Scan completed on 12/13/18 with results revealing Multifocal uptake throughout the skeleton, consistent with diffuse metastatic disease. Primary tumor is not specified. 2. Uptake in the proximal right femur, consistent with lytic lesions. 3. Uptake in the ribs bilaterally as described. 4. Lesions in the proximal left humerus. 5. Diffuse uptake throughout the skull consistent with metastatic disease. 6. Right paramedian uptake at the manubrium.  Most recent lab results (12/08/18) of CBC w/diff and CMP is as follows: all values are WNL except for Glucose at 279, BUN at 24, AST at 41, ALT at 46. 12/08/18 SPEP revealed all values WNL except for Total Protein at 6.0,  Albumin at 3.6, Gamma globulin at 0.7, and M spike at 0.5g  On review of systems, pt reports significant left shoulder pain, back pain, right hip pain, dark urine, and denies fevers, chills, night sweats, unexpected weight loss, changes in bowel habits, changes in breathing, cough, new respiratory symptoms, changes in vision, abdominal pains, leg swelling, and any other symptoms.   On PMHx the pt reports fatty liver, and denies blood clots.    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 Denies ever smoking.  On Family Hx the pt reports maternal grandmother with colon cancer. Father with bladder cancer and amyloidosis (pt notes that this could have been misdiagnosis). Mother with Protein S deficiency and polymyalgia rheumatica.  Interval History:   Faith Orr returns today for management and evaluation of her newly diagnosed Multiple myeloma and C7D1 of Carfilzomib, Revlimid, and Dexamethasone. The patient's last visit with [name] was on 06/22/2019. The pt reports that she is doing well overall.  The pt reports that she has been experiencing backaches. She took Tylenol yesterday, which helped. She has also been experiencing some mild nausea and needs a Zofran refill today.  Lab results today (07/20/2019) of CBC w/diff and CMP is as follows: all values are WNL 07/20/2019 CMP is reviewed 07/20/2019 MMP shows no M spike  On review of systems, pt reports mild nausea, backaches and denies arm pain and any other symptoms.   MEDICAL HISTORY:  Past Medical History:  Diagnosis Date   Allergy    seasonal   Asthma    DEPRESSION    DIABETES MELLITUS, TYPE II    Diverticulosis    HYPERLIPIDEMIA    Macular degeneration of left eye    mild, Dr.Hecker   Obesity, unspecified    Osteoarthritis of both knees    OSTEOPENIA    Osteopenia    URINARY INCONTINENCE     SURGICAL HISTORY: Past Surgical History:  Procedure Laterality Date   BREAST SURGERY     CATARACT EXTRACTION Left 05/24/2018   CESAREAN SECTION  01/1973   FRACTURE SURGERY     IR IMAGING GUIDED PORT INSERTION  02/20/2019   left wrist surgery  2008   By Dr. Latanya Maudlin   right ankle  1994    SOCIAL HISTORY: Social History   Socioeconomic History   Marital status: Married    Spouse name: Not on file   Number of children: 1   Years of education: Not on file   Highest education level: Not on file   Occupational History    Employer: [employer] resource strain: Patient refused   Food insecurity    Worry: Patient refused    Inability: Patient refused   Transportation needs    Medical: Patient refused    Non-medical: Patient refused  Tobacco Use   Smoking status: Never Smoker   Smokeless tobacco: Never Used   Tobacco comment: Lives with partner [name]) and son  Substance and Sexual Activity   Alcohol use: No    Alcohol/week: 0.0 standard drinks   Drug use: No   Sexual activity: Never    Partners: Female    Birth control/protection: Post-menopausal    Comment: Lives with female partner (annette hicks) and 13 yo son  Lifestyle   Physical activity    Days per week: Patient refused    Minutes per session: Patient refused   Stress: Patient refused  Relationships   Social  connections    Talks on phone: Patient refused    Gets together: Patient refused    Attends religious service: Patient refused    Active member of club or organization: Patient refused    Attends meetings of clubs or organizations: Patient refused    Relationship status: Patient refused   Intimate partner violence    Fear of current or ex partner: Patient refused    Emotionally abused: Patient refused    Physically abused: Patient refused    Forced sexual activity: Patient refused  Other Topics Concern   Not on file  Social History Narrative   Not on file    FAMILY HISTORY: Family History  Problem Relation Age of Onset   Diabetes Father    Hyperlipidemia Father    Heart disease Father    Cancer Father    Hypertension Father    Colon cancer Paternal Grandmother 30   Osteoporosis Mother    Protein S deficiency Mother    Hyperlipidemia Mother    Multiple sclerosis Daughter    Cancer Other        bladder   Breast cancer Neg Hx     ALLERGIES:  is allergic to penicillins; aleve [naproxen sodium]; and sulfonamide  derivatives.  MEDICATIONS:  Current Outpatient Medications  Medication Sig Dispense Refill   acyclovir (ZOVIRAX) 400 MG tablet TAKE 1 TABLET(400 MG) BY MOUTH TWICE DAILY 60 tablet 0   albuterol (PROVENTIL HFA;VENTOLIN HFA) 108 (90 Base) MCG/ACT inhaler INHALE 1 TO 2 PUFFS INTO THE LUNGS EVERY 6 HOURS AS NEEDED FOR WHEEZING OR SHORTNESS OF BREATH (Patient not taking: No sig reported) 54 g 1   aspirin EC 81 MG tablet Take 81 mg by mouth daily after breakfast.      Blood Glucose Monitoring Suppl (FREESTYLE FREEDOM LITE) W/DEVICE KIT Use to check blood sugars twice a day Dx 250.00 1 each 0   Calcium Carbonate-Vitamin D (CALCIUM 600+D HIGH POTENCY) 600-400 MG-UNIT per tablet Take 1 tablet by mouth 2 (two) times daily.      Cetirizine HCl 10 MG CAPS Take 1 capsule (10 mg total) by mouth daily. 30 capsule 1   clotrimazole-betamethasone (LOTRISONE) cream Apply 1 application topically 2 (two) times daily. Over left arm rash 30 g 0   Continuous Blood Gluc Sensor (FREESTYLE LIBRE 14 DAY SENSOR) MISC 1 each by Does not apply route every 14 (fourteen) days. 2 each 11   dexamethasone (DECADRON) 4 MG tablet Take 5 tablets (20 mg total) by mouth once a week. On D22 of each cycle of treatment 20 tablet 5   DOK 100 MG capsule TAKE 2 CAPSULES BY MOUTH AT BEDTIME 60 capsule 1   fentaNYL (DURAGESIC) 12 MCG/HR Place 1 patch onto the skin every 3 (three) days. 10 patch 0   fexofenadine (ALLEGRA) 60 MG tablet Take 1 tablet (60 mg total) by mouth 2 (two) times daily. 60 tablet 1   fluticasone (FLONASE) 50 MCG/ACT nasal spray Place 1 spray into both nostrils daily. (Patient taking differently: Place 1 spray into both nostrils daily as needed for allergies or rhinitis. ) 16 g 2   glipiZIDE (GLUCOTROL XL) 5 MG 24 hr tablet TAKE 1 TABLET(5 MG) BY MOUTH DAILY WITH BREAKFAST 90 tablet 1   glucose blood (FREESTYLE LITE) test strip CHECK BLOOD SUGAR TWICE DAILY AS DIRECTED Dx 250.00 180 each 3   Lancets  (FREESTYLE) lancets Use twice daily to check sugars. 100 each 11   lenalidomide (REVLIMID) 15 MG capsule Take 1 capsule (  15 mg total) by mouth daily. Take for 21 days on, 7 days off, repeat every 28 days. 21 capsule 0   lidocaine-prilocaine (EMLA) cream APPLY 1 APPLICATION TO THE AFFECTED AREA AS NEEDED. USE PRIOR TO PORT ACCESS 30 g 0   LORazepam (ATIVAN) 0.5 MG tablet Take 1 tablet (0.5 mg total) by mouth every 8 (eight) hours as needed for anxiety (significant essential tremors). 60 tablet 0   metFORMIN (GLUCOPHAGE-XR) 500 MG 24 hr tablet TAKE 3 TABLETS(1500 MG) BY MOUTH DAILY WITH BREAKFAST 270 tablet 1   montelukast (SINGULAIR) 10 MG tablet TAKE 1 TABLET BY MOUTH DAILY AS NEEDED (Patient taking differently: Take 10 mg by mouth at bedtime as needed (allergies). ) 30 tablet 3   Multiple Vitamins-Minerals (ICAPS) CAPS Take 1 capsule by mouth daily after breakfast.      ondansetron (ZOFRAN) 8 MG tablet Take 1 tablet (8 mg total) by mouth 2 (two) times daily as needed (Nausea or vomiting). 30 tablet 1   Oxycodone HCl 10 MG TABS Take 1 tablet (10 mg total) by mouth every 6 (six) hours as needed. 90 tablet 0   pantoprazole (PROTONIX) 20 MG tablet TAKE 1 TABLET(20 MG) BY MOUTH DAILY (Patient not taking: No sig reported) 30 tablet 5   polyethylene glycol (MIRALAX / GLYCOLAX) packet Take 17 g by mouth daily after breakfast.      prochlorperazine (COMPAZINE) 10 MG tablet Take 1 tablet (10 mg total) by mouth every 6 (six) hours as needed (Nausea or vomiting). 30 tablet 1   sertraline (ZOLOFT) 50 MG tablet TAKE 1 TABLET BY MOUTH DAILY 90 tablet 1   simvastatin (ZOCOR) 20 MG tablet TAKE 1 TABLET(20 MG) BY MOUTH DAILY 90 tablet 1   Triamcinolone Acetonide 0.025 % LOTN Apply 1 application topically 3 (three) times daily as needed (rash/itching). 60 mL 2   Vitamin D, Ergocalciferol, (DRISDOL) 1.25 MG (50000 UT) CAPS capsule TAKE 1 CAPSULE BY MOUTH EVERY 7 DAYS 12 capsule 0   No current  facility-administered medications for this visit.    Facility-Administered Medications Ordered in Other Visits  Medication Dose Route Frequency Provider Last Rate Last Dose   heparin lock flush 100 unit/mL  500 Units Intracatheter Once PRN Brunetta Genera, MD       sodium chloride flush (NS) 0.9 % injection 10 mL  10 mL Intracatheter PRN Brunetta Genera, MD   10 mL at 07/20/19 1653    REVIEW OF SYSTEMS:    A 10+ POINT REVIEW OF SYSTEMS WAS OBTAINED including neurology, dermatology, psychiatry, cardiac, respiratory, lymph, extremities, GI, GU, Musculoskeletal, constitutional, breasts, reproductive, HEENT.  All pertinent positives are noted in the HPI.  All others are negative.   PHYSICAL EXAMINATION: ECOG PERFORMANCE STATUS: 2 - Symptomatic, <50% confined to bed  Vitals:   07/20/19 1419  BP: 119/60  Pulse: 94  Resp: 18  Temp: 98.9 F (37.2 C)  SpO2: 98%   Filed Weights   07/20/19 1419  Weight: 170 lb 8 oz (77.3 kg)   .Body mass index is 34.44 kg/m.   GENERAL:alert, in no acute distress and comfortable SKIN: no acute rashes, no significant lesions EYES: conjunctiva are pink and non-injected, sclera anicteric OROPHARYNX: MMM, no exudates, no oropharyngeal erythema or ulceration NECK: supple, no JVD LYMPH:  no palpable lymphadenopathy in the cervical, axillary or inguinal regions LUNGS: clear to auscultation b/l with normal respiratory effort HEART: regular rate & rhythm ABDOMEN:  normoactive bowel sounds , non tender, not distended. No palpable hepatosplenomegaly.  Extremity: no pedal edema PSYCH: alert & oriented x 3 with fluent speech NEURO: no focal motor/sensory deficits   LABORATORY DATA:  I have reviewed the data as listed  . CBC Latest Ref Rng & Units 07/20/2019 06/29/2019 06/22/2019  WBC 4.0 - 10.5 K/uL 5.5 10.7(H) 8.4  Hemoglobin 12.0 - 15.0 g/dL 13.0 13.3 12.6  Hematocrit 36.0 - 46.0 % 39.0 40.0 37.5  Platelets 150 - 400 K/uL 202 165 237   . CBC     Component Value Date/Time   WBC 5.5 07/20/2019 1312   RBC 4.10 07/20/2019 1312   HGB 13.0 07/20/2019 1312   HGB 12.8 02/16/2019 1138   HCT 39.0 07/20/2019 1312   PLT 202 07/20/2019 1312   PLT 170 02/16/2019 1138   MCV 95.1 07/20/2019 1312   MCH 31.7 07/20/2019 1312   MCHC 33.3 07/20/2019 1312   RDW 13.9 07/20/2019 1312   LYMPHSABS 1.2 07/20/2019 1312   MONOABS 0.8 07/20/2019 1312   EOSABS 0.3 07/20/2019 1312   BASOSABS 0.1 07/20/2019 1312    . CMP Latest Ref Rng & Units 07/20/2019 06/29/2019 06/22/2019  Glucose 70 - 99 mg/dL 228(H) 221(H) 242(H)  BUN 8 - 23 mg/dL '9 17 14  ' Creatinine 0.44 - 1.00 mg/dL 0.75 0.78 0.74  Sodium 135 - 145 mmol/L 140 138 136  Potassium 3.5 - 5.1 mmol/L 3.5 3.7 3.6  Chloride 98 - 111 mmol/L 106 105 103  CO2 22 - 32 mmol/L 20(L) 20(L) 22  Calcium 8.9 - 10.3 mg/dL 9.8 9.7 9.2  Total Protein 6.5 - 8.1 g/dL 6.6 6.3(L) 6.3(L)  Total Bilirubin 0.3 - 1.2 mg/dL 0.6 0.7 0.5  Alkaline Phos 38 - 126 U/L 73 78 82  AST 15 - 41 U/L 21 14(L) 16  ALT 0 - 44 U/L '23 19 18   ' Most Recent MMPs: Component     Latest Ref Rng & Units 03/02/2019 04/13/2019 06/08/2019  IgG (Immunoglobin G), Serum     586 - 1,602 mg/dL 502 (L) 689 543 (L)  IgA     64 - 422 mg/dL 57 (L) 193 196  IgM (Immunoglobulin M), Srm     26 - 217 mg/dL 14 (L) 86 33  Total Protein ELP     6.0 - 8.5 g/dL 5.7 (L) 6.0 5.9 (L)  Albumin SerPl Elph-Mcnc     2.9 - 4.4 g/dL 3.3 3.3 3.3  Alpha 1     0.0 - 0.4 g/dL 0.2 0.2 0.2  Alpha2 Glob SerPl Elph-Mcnc     0.4 - 1.0 g/dL 0.9 0.9 0.7  B-Globulin SerPl Elph-Mcnc     0.7 - 1.3 g/dL 0.9 1.0 1.2  Gamma Glob SerPl Elph-Mcnc     0.4 - 1.8 g/dL 0.4 0.6 0.5  M Protein SerPl Elph-Mcnc     Not Observed g/dL 0.2 (H) 0.2 (H) 0.2 (H)  Globulin, Total     2.2 - 3.9 g/dL 2.4 2.7 2.6  Albumin/Glob SerPl     0.7 - 1.7 1.4 1.3 1.3  IFE 1      Comment Comment Comment  Please Note (HCV):      Comment Comment Comment     Component     Latest Ref Rng & Units  12/27/2018  IgG (Immunoglobin G), Serum     700 - 1,600 mg/dL 801  IgA     64 - 422 mg/dL 91  IgM (Immunoglobulin M), Srm     26 - 217 mg/dL 15 (L)  Total Protein ELP  6.0 - 8.5 g/dL 6.6  Albumin SerPl Elph-Mcnc     2.9 - 4.4 g/dL 3.7  Alpha 1     0.0 - 0.4 g/dL 0.2  Alpha2 Glob SerPl Elph-Mcnc     0.4 - 1.0 g/dL 0.8  B-Globulin SerPl Elph-Mcnc     0.7 - 1.3 g/dL 1.2  Gamma Glob SerPl Elph-Mcnc     0.4 - 1.8 g/dL 0.7  M Protein SerPl Elph-Mcnc     Not Observed g/dL 0.5 (H)  Globulin, Total     2.2 - 3.9 g/dL 2.9  Albumin/Glob SerPl     0.7 - 1.7 1.3  IFE 1      Comment  Please Note (HCV):      Comment  Kappa free light chain     3.3 - 19.4 mg/L 5.1  Lamda free light chains     5.7 - 26.3 mg/L 40.3 (H)  Kappa, lamda light chain ratio     0.26 - 1.65 0.13 (L)  Beta-2 Microglobulin     0.6 - 2.4 mg/L 1.7  LDH     98 - 192 U/L 162  Sed Rate     0 - 22 mm/hr 8   05/30/2019 BM Bx   01/06/2019 BM Bx:     01/06/19 Cytogenetics:      05/30/19 BM Biopsy:    RADIOGRAPHIC STUDIES: I have personally reviewed the radiological images as listed and agreed with the findings in the report. No results found.  ASSESSMENT & PLAN:   78 y.o. female with  1. Recently diagnosed Multiple Myeloma, RISS Stage III  Labs upon initial presentation from 12/08/18, blood counts are normal including WBC at 7.1k, HGB at 13.1, and PLT at 245k. Calcium normal at 10.3. Creatinine normal at 0.63. M spike at 0.5g. 12/13/18 Bone Scan revealed Multifocal uptake throughout the skeleton, consistent with diffuse metastatic disease. Primary tumor is not specified. 2. Uptake in the proximal right femur, consistent with lytic lesions. 3. Uptake in the ribs bilaterally as described. 4. Lesions in the proximal left humerus. 5. Diffuse uptake throughout the skull consistent with metastatic disease. 6. Right paramedian uptake at the manubrium.  12/13/18 CT Right Femur revealed Numerous lytic  lesions involving the right femur and a lytic lesion in the left inferior pubic ramus. Overall appearance is most concerning for multiple myeloma  12/27/18 Pretreatment 24hour UPEP observed an M spike at 68m, and showed 1934mtotal protein/day.  12/27/18 Pretreatment MMP revealed M Protein at 0.5g with IgG Lambda specificity. Kappa:Lambda light chain ratio at 0.13, with Lambda at 40.3. There is less abnormal protein and light chains than I would expect from 30% plasma cells, which suggests hypo-secretory or non-secretory neoplastic plasma cells. Will have an impact in assessing response. 01/05/19 PET/CT revealed Innumerable lytic lesions in the skeleton compatible with myeloma. Most of the larger lesions are hypermetabolic, for example including a left proximal humeral shaft lesion with maximum SUV of 8.1 and a 2.8 cm lesion in the left T9 vertebral body with maximum SUV 5.1. Most of the smaller lytic lesions, and some of the larger lesions, do not demonstrate accentuated metabolic activity. 2. 1.2 cm in short axis lymph node in the left parapharyngeal space is hypermetabolic with maximum SUV 11.8. I do not see a separate mass in the head and neck to give rise to this hypermetabolic lymph node. 3. Mosaic attenuation in the lower lobes, nonspecific possibly from air trapping. 4.  Aortic Atherosclerosis 5. Heterogeneous activity in the liver, making  it hard to exclude small liver lesions. Consider hepatic protocol MRI with and without contrast for definitive assessment. Nonobstructive right nephrolithiasis. Old granulomatous disease  01/06/19 Bone Marrow biopsy revealed interstitial increase in plasma cells (28% aspirate, 40% CD138 immunohistochemistry). 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   06/01/19 PET/CT revealed "Dominant lesion in the LEFT humerus is decreased significantly in metabolic activity. Additional hypermetabolic skeletal lytic lesions have decreased in metabolic activity or similar to comparison exam (01/05/2019). No evidence of disease progression. 2. Multiple additional lytic lesions do not have metabolic activity and unchanged. 3. No new skeletal lesions are identified. No soft tissue plasmacytoma identified. 4. Nodule / node in the LEFT parapharyngeal space which is intensely hypermetabolic not changed from prior. 5. New hypermetabolic LEFT lower lobe pulmonary nodule is indeterminate. Recommend close attention on follow-up 6. New obstructive hydronephrosis of the RIGHT kidney related to RIGHT UPJ stone."  2. 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  01/17/19 MRI Liver revealed Several appreciable liver lesions all have benign imaging characteristics. No MRI findings of metastatic involvement of the liver. 2. Scattered bony lesions corresponding to the lytic lesions seen at PET-CT, compatible with active myeloma. 3. Aortic Atherosclerosis.  Mild cardiomegaly. 4. Diffuse hepatic steatosis.   3. Left lower lobe pulmonary nodule First seen on 06/01/19 PET/CT  PLAN -Discussed pt labwork today, 07/20/2019; blood counts are normal -Pt is meeting with the transplant team on 08/14/2019 but is leaning towards not getting a transplant. -Discussed that if the pt is not interested in an autologous BM transplant, her treatment options include: continuing full regimen until progression OR switch to maintenance Carfilzomib every 2 wks  -Will plan to watch new pulmonary nodule in the next 3 months with repeat imaging -Left parapharyngeal finding has not changed through interval scans but recommend further evaluation with  ENT -Discussed that as the pt's initial BM Biopsy revealed plasma cell involvement in the 40% range, and is now at 5% polytypic involvement; she has responded very well to treatment. -Discussed again that the pt does have a hypo-secretory presentation of her multiple myeloma -The pt has no prohibitive toxicities from continuing C7D1 Carfilzomib, Revlimid, and Dexamethasone at this time.  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 Difficult to forecast disease behavior given presence of more than one mutation -Low dose of Ativan. Advised against using this to sleep or before driving during first use. Discussed potential addictive properties and avoiding excessive use. -Continue 92m Claritin or Zyrtec and 212mPepcid for mild rashes, will continue to watch these -Adjusted premedications to add Tylenol and will split dose of Dexamethasone with pre-meds and pt will not take Decadron at home except on D22 -Continue Fentanyl patch and 104mxycodone for break through pain -Continue Zometa every 4 weeks -Continue Acyclovir and 61m70mpirin to mitigate risk of Shingles and blood clots respectively -Continue follow up with PCP Dr. ElizPricilla Holm management and evaluation of her DM, especially while on treatment with steroids as blood sugars will be higher -Will refill Zofran and oxycodone -Rx Peridex    Please schedule C8 of treatment as ordered  RTC with Dr Irene Limbo with labs with C8D1   All of the patients questions were answered with apparent satisfaction. The patient knows to call the clinic with any problems, questions or concerns.  The total time spent in the appt was 25 minutes and more than 50% was on counseling and direct patient cares.   Sullivan Lone MD MS AAHIVMS [facility] Hematology/Oncology Physician [facility]  (Office):       [phone] (Work cell):  [phone] (Fax):           [phone]  07/20/2019 5:13  PM  I, De Burrs, am acting as a scribe for Dr. Irene Limbo  .I have reviewed the above documentation for accuracy and completeness, and I agree with the above. Brunetta Genera MD

## 2019-07-20 ENCOUNTER — Other Ambulatory Visit: Payer: Self-pay

## 2019-07-20 ENCOUNTER — Inpatient Hospital Stay: Payer: Medicare Other

## 2019-07-20 ENCOUNTER — Inpatient Hospital Stay: Payer: Medicare Other | Attending: Hematology

## 2019-07-20 ENCOUNTER — Inpatient Hospital Stay: Payer: Medicare Other | Admitting: Hematology

## 2019-07-20 VITALS — BP 119/60 | HR 94 | Temp 98.9°F | Resp 18 | Ht 59.0 in | Wt 170.5 lb

## 2019-07-20 DIAGNOSIS — Z7189 Other specified counseling: Secondary | ICD-10-CM

## 2019-07-20 DIAGNOSIS — C9000 Multiple myeloma not having achieved remission: Secondary | ICD-10-CM

## 2019-07-20 DIAGNOSIS — Z95828 Presence of other vascular implants and grafts: Secondary | ICD-10-CM

## 2019-07-20 DIAGNOSIS — Z5112 Encounter for antineoplastic immunotherapy: Secondary | ICD-10-CM | POA: Diagnosis present

## 2019-07-20 LAB — CBC WITH DIFFERENTIAL/PLATELET
Abs Immature Granulocytes: 0.04 10*3/uL (ref 0.00–0.07)
Basophils Absolute: 0.1 10*3/uL (ref 0.0–0.1)
Basophils Relative: 1 %
Eosinophils Absolute: 0.3 10*3/uL (ref 0.0–0.5)
Eosinophils Relative: 5 %
HCT: 39 % (ref 36.0–46.0)
Hemoglobin: 13 g/dL (ref 12.0–15.0)
Immature Granulocytes: 1 %
Lymphocytes Relative: 22 %
Lymphs Abs: 1.2 10*3/uL (ref 0.7–4.0)
MCH: 31.7 pg (ref 26.0–34.0)
MCHC: 33.3 g/dL (ref 30.0–36.0)
MCV: 95.1 fL (ref 80.0–100.0)
Monocytes Absolute: 0.8 10*3/uL (ref 0.1–1.0)
Monocytes Relative: 15 %
Neutro Abs: 3.1 10*3/uL (ref 1.7–7.7)
Neutrophils Relative %: 56 %
Platelets: 202 10*3/uL (ref 150–400)
RBC: 4.1 MIL/uL (ref 3.87–5.11)
RDW: 13.9 % (ref 11.5–15.5)
WBC: 5.5 10*3/uL (ref 4.0–10.5)
nRBC: 0 % (ref 0.0–0.2)

## 2019-07-20 LAB — CMP (CANCER CENTER ONLY)
ALT: 23 U/L (ref 0–44)
AST: 21 U/L (ref 15–41)
Albumin: 3.5 g/dL (ref 3.5–5.0)
Alkaline Phosphatase: 73 U/L (ref 38–126)
Anion gap: 14 (ref 5–15)
BUN: 9 mg/dL (ref 8–23)
CO2: 20 mmol/L — ABNORMAL LOW (ref 22–32)
Calcium: 9.8 mg/dL (ref 8.9–10.3)
Chloride: 106 mmol/L (ref 98–111)
Creatinine: 0.75 mg/dL (ref 0.44–1.00)
GFR, Est AFR Am: >60 mL/min (ref >60)
GFR, Estimated: >60 mL/min (ref >60)
Glucose, Bld: 228 mg/dL — ABNORMAL HIGH (ref 70–99)
Potassium: 3.5 mmol/L (ref 3.5–5.1)
Sodium: 140 mmol/L (ref 135–145)
Total Bilirubin: 0.6 mg/dL (ref 0.3–1.2)
Total Protein: 6.6 g/dL (ref 6.5–8.1)

## 2019-07-20 MED ORDER — ZOLEDRONIC ACID 4 MG/100ML IV SOLN
4.0000 mg | Freq: Once | INTRAVENOUS | Status: AC
  Administered 2019-07-20: 4 mg via INTRAVENOUS
  Filled 2019-07-20: qty 100

## 2019-07-20 MED ORDER — ACETAMINOPHEN 325 MG PO TABS
ORAL_TABLET | ORAL | Status: AC
  Filled 2019-07-20: qty 2

## 2019-07-20 MED ORDER — SODIUM CHLORIDE 0.9% FLUSH
10.0000 mL | INTRAVENOUS | Status: DC | PRN
  Administered 2019-07-20: 10 mL
  Filled 2019-07-20: qty 10

## 2019-07-20 MED ORDER — HEPARIN SOD (PORK) LOCK FLUSH 100 UNIT/ML IV SOLN
500.0000 [IU] | Freq: Once | INTRAVENOUS | Status: AC | PRN
  Administered 2019-07-20: 500 [IU]
  Filled 2019-07-20: qty 5

## 2019-07-20 MED ORDER — DIPHENHYDRAMINE HCL 25 MG PO TABS: 25.0000 mg | ORAL_TABLET | Freq: Once | ORAL | Status: DC

## 2019-07-20 MED ORDER — ACETAMINOPHEN 325 MG PO TABS
650.0000 mg | ORAL_TABLET | Freq: Once | ORAL | Status: AC
  Administered 2019-07-20: 650 mg via ORAL

## 2019-07-20 MED ORDER — DEXTROSE 5 % IV SOLN
27.0000 mg/m2 | Freq: Once | INTRAVENOUS | Status: AC
  Administered 2019-07-20: 50 mg via INTRAVENOUS
  Filled 2019-07-20: qty 10

## 2019-07-20 MED ORDER — SODIUM CHLORIDE 0.9% FLUSH
10.0000 mL | Freq: Once | INTRAVENOUS | Status: AC
  Administered 2019-07-20: 10 mL
  Filled 2019-07-20: qty 10

## 2019-07-20 MED ORDER — DEXAMETHASONE 4 MG PO TABS
20.0000 mg | ORAL_TABLET | Freq: Once | ORAL | Status: AC
  Administered 2019-07-20: 20 mg via ORAL

## 2019-07-20 MED ORDER — FAMOTIDINE 20 MG PO TABS: 20.0000 mg | ORAL_TABLET | Freq: Once | ORAL | Status: DC

## 2019-07-20 MED ORDER — SODIUM CHLORIDE 0.9 % IV SOLN
Freq: Once | INTRAVENOUS | Status: AC
  Administered 2019-07-20: 15:00:00 via INTRAVENOUS
  Filled 2019-07-20: qty 250

## 2019-07-20 MED ORDER — SODIUM CHLORIDE 0.9 % IV SOLN
Freq: Once | INTRAVENOUS | Status: AC
  Administered 2019-07-20: 16:00:00 via INTRAVENOUS
  Filled 2019-07-20: qty 250

## 2019-07-20 MED ORDER — DEXAMETHASONE 4 MG PO TABS
ORAL_TABLET | ORAL | Status: AC
  Filled 2019-07-20: qty 5

## 2019-07-20 MED ORDER — PROCHLORPERAZINE MALEATE 10 MG PO TABS
ORAL_TABLET | ORAL | Status: AC
  Filled 2019-07-20: qty 1

## 2019-07-20 MED ORDER — PROCHLORPERAZINE MALEATE 10 MG PO TABS
10.0000 mg | ORAL_TABLET | Freq: Once | ORAL | Status: AC
  Administered 2019-07-20: 10 mg via ORAL

## 2019-07-20 NOTE — Patient Instructions (Addendum)
[facility] Discharge Instructions for Patients Receiving Chemotherapy  Today you received the following chemotherapy agents Kyprolis, zometa To help prevent nausea and vomiting after your treatment, we encourage you to take your nausea medicationas directed by your MD.   If you develop nausea and vomiting that is not controlled by your nausea medication, call the clinic.   BELOW ARE SYMPTOMS THAT SHOULD BE REPORTED IMMEDIATELY:  *FEVER GREATER THAN 100.5 F  *CHILLS WITH OR WITHOUT FEVER  NAUSEA AND VOMITING THAT IS NOT CONTROLLED WITH YOUR NAUSEA MEDICATION  *UNUSUAL SHORTNESS OF BREATH  *UNUSUAL BRUISING OR BLEEDING  TENDERNESS IN MOUTH AND THROAT WITH OR WITHOUT PRESENCE OF ULCERS  *URINARY PROBLEMS  *BOWEL PROBLEMS  UNUSUAL RASH Items with * indicate a potential emergency and should be followed up as soon as possible.  Feel free to call the clinic should you have any questions or concerns. The clinic phone number is [phone].  Please show the [place] at check-in to the Emergency Department and triage nurse.  Coronavirus (COVID-19) Are you at risk?  Are you at risk for the Coronavirus (COVID-19)?  To be considered HIGH RISK for Coronavirus (COVID-19), you have to meet the following criteria:  . Traveled to [place], [place], [place], [place] or [place]; or in the [place] to [place], [place], [place], or [place]; and have fever, cough, and shortness of breath within the last 2 weeks of travel OR . Been in close contact with a person diagnosed with COVID-19 within the last 2 weeks and have fever, cough, and shortness of breath . IF YOU DO NOT MEET THESE CRITERIA, YOU ARE CONSIDERED LOW RISK FOR COVID-19.  What to do if you are HIGH RISK for COVID-19?  Marland Kitchen If you are having a medical emergency, call 911. . Seek medical care right away. Before you go to a doctor's office, urgent care or emergency department, call ahead and tell them  about your recent travel, contact with someone diagnosed with COVID-19, and your symptoms. You should receive instructions from your physician's office regarding next steps of care.  . When you arrive at healthcare provider, tell the healthcare staff immediately you have returned from visiting [place], [place], [place], [place] or [place]; or traveled in the [place] to [place], [place], [place], or [place]; in the last two weeks or you have been in close contact with a person diagnosed with COVID-19 in the last 2 weeks.   . Tell the health care staff about your symptoms: fever, cough and shortness of breath. . After you have been seen by a medical provider, you will be either: o Tested for (COVID-19) and discharged home on quarantine except to seek medical care if symptoms worsen, and asked to  - Stay home and avoid contact with others until you get your results (4-5 days)  - Avoid travel on public transportation if possible (such as bus, train, or airplane) or o Sent to the Emergency Department by EMS for evaluation, COVID-19 testing, and possible admission depending on your condition and test results.  What to do if you are LOW RISK for COVID-19?  Reduce your risk of any infection by using the same precautions used for avoiding the common cold or flu:  Marland Kitchen Wash your hands often with soap and warm water for at least 20 seconds.  If soap and water are not readily available, use an alcohol-based hand sanitizer with at least 60% alcohol.  . If coughing  or sneezing, cover your mouth and nose by coughing or sneezing into the elbow areas of your shirt or coat, into a tissue or into your sleeve (not your hands). . Avoid shaking hands with others and consider head nods or verbal greetings only. . Avoid touching your eyes, nose, or mouth with unwashed hands.  . Avoid close contact with people who are sick. . Avoid places or events with large numbers of people in one location, like concerts or  sporting events. . Carefully consider travel plans you have or are making. . If you are planning any travel outside or inside the [name], visit the CDC's Travelers' Health webpage for the latest health notices. . If you have some symptoms but not all symptoms, continue to monitor at home and seek medical attention if your symptoms worsen. . If you are having a medical emergency, call 911.   [place] / e-Visit: [email or web address]         [facility]: [facility]: [phone]                   [facility]: [phone]

## 2019-07-20 NOTE — Progress Notes (Signed)
Patient took pepcid and generic, non drowsy benadryl at 11:00 today before coming for treatment. Pharmacy notified, and removing benadryl and pepcid as pre-medications for todays treatment.

## 2019-07-21 ENCOUNTER — Other Ambulatory Visit: Payer: Self-pay

## 2019-07-21 ENCOUNTER — Inpatient Hospital Stay: Payer: Medicare Other

## 2019-07-21 ENCOUNTER — Encounter: Payer: Self-pay | Admitting: Hematology

## 2019-07-21 VITALS — BP 143/75 | HR 93 | Temp 98.0°F | Resp 18

## 2019-07-21 DIAGNOSIS — Z5112 Encounter for antineoplastic immunotherapy: Secondary | ICD-10-CM | POA: Diagnosis not present

## 2019-07-21 DIAGNOSIS — Z7189 Other specified counseling: Secondary | ICD-10-CM

## 2019-07-21 DIAGNOSIS — C9000 Multiple myeloma not having achieved remission: Secondary | ICD-10-CM

## 2019-07-21 MED ORDER — PROCHLORPERAZINE MALEATE 10 MG PO TABS
10.0000 mg | ORAL_TABLET | Freq: Once | ORAL | Status: AC
  Administered 2019-07-21: 10 mg via ORAL

## 2019-07-21 MED ORDER — FAMOTIDINE 20 MG PO TABS
20.0000 mg | ORAL_TABLET | Freq: Once | ORAL | Status: AC
  Administered 2019-07-21: 20 mg via ORAL

## 2019-07-21 MED ORDER — ACETAMINOPHEN 325 MG PO TABS
ORAL_TABLET | ORAL | Status: AC
  Filled 2019-07-21: qty 2

## 2019-07-21 MED ORDER — PROCHLORPERAZINE MALEATE 10 MG PO TABS
ORAL_TABLET | ORAL | Status: AC
  Filled 2019-07-21: qty 1

## 2019-07-21 MED ORDER — DIPHENHYDRAMINE HCL 25 MG PO CAPS
25.0000 mg | ORAL_CAPSULE | Freq: Once | ORAL | Status: AC
  Administered 2019-07-21: 25 mg via ORAL

## 2019-07-21 MED ORDER — SODIUM CHLORIDE 0.9 % IV SOLN
Freq: Once | INTRAVENOUS | Status: AC
  Administered 2019-07-21: 13:00:00 via INTRAVENOUS
  Filled 2019-07-21: qty 250

## 2019-07-21 MED ORDER — DEXAMETHASONE 4 MG PO TABS
20.0000 mg | ORAL_TABLET | Freq: Once | ORAL | Status: AC
  Administered 2019-07-21: 13:00:00 20 mg via ORAL

## 2019-07-21 MED ORDER — ACETAMINOPHEN 325 MG PO TABS
650.0000 mg | ORAL_TABLET | Freq: Once | ORAL | Status: AC
  Administered 2019-07-21: 650 mg via ORAL

## 2019-07-21 MED ORDER — FAMOTIDINE 20 MG PO TABS
ORAL_TABLET | ORAL | Status: AC
  Filled 2019-07-21: qty 1

## 2019-07-21 MED ORDER — SODIUM CHLORIDE 0.9% FLUSH
10.0000 mL | INTRAVENOUS | Status: DC | PRN
  Administered 2019-07-21: 10 mL
  Filled 2019-07-21: qty 10

## 2019-07-21 MED ORDER — HEPARIN SOD (PORK) LOCK FLUSH 100 UNIT/ML IV SOLN
500.0000 [IU] | Freq: Once | INTRAVENOUS | Status: AC | PRN
  Administered 2019-07-21: 500 [IU]
  Filled 2019-07-21: qty 5

## 2019-07-21 MED ORDER — DIPHENHYDRAMINE HCL 25 MG PO CAPS
ORAL_CAPSULE | ORAL | Status: AC
  Filled 2019-07-21: qty 1

## 2019-07-21 MED ORDER — DEXAMETHASONE 4 MG PO TABS
ORAL_TABLET | ORAL | Status: AC
  Filled 2019-07-21: qty 5

## 2019-07-21 MED ORDER — DEXTROSE 5 % IV SOLN
27.0000 mg/m2 | Freq: Once | INTRAVENOUS | Status: AC
  Administered 2019-07-21: 50 mg via INTRAVENOUS
  Filled 2019-07-21: qty 15

## 2019-07-21 NOTE — Patient Instructions (Signed)
[facility] Discharge Instructions for Patients Receiving Chemotherapy  Today you received the following chemotherapy agents Carfilzomib (KYPROLIS).  To help prevent nausea and vomiting after your treatment, we encourage you to take your nausea medication as prescribed.  If you develop nausea and vomiting that is not controlled by your nausea medication, call the clinic.   BELOW ARE SYMPTOMS THAT SHOULD BE REPORTED IMMEDIATELY:  *FEVER GREATER THAN 100.5 F  *CHILLS WITH OR WITHOUT FEVER  NAUSEA AND VOMITING THAT IS NOT CONTROLLED WITH YOUR NAUSEA MEDICATION  *UNUSUAL SHORTNESS OF BREATH  *UNUSUAL BRUISING OR BLEEDING  TENDERNESS IN MOUTH AND THROAT WITH OR WITHOUT PRESENCE OF ULCERS  *URINARY PROBLEMS  *BOWEL PROBLEMS  UNUSUAL RASH Items with * indicate a potential emergency and should be followed up as soon as possible.  Feel free to call the clinic should you have any questions or concerns. The clinic phone number is [phone].  Please show the [facility] at check-in to the Emergency Department and triage nurse.  Coronavirus (COVID-19) Are you at risk?  Are you at risk for the Coronavirus (COVID-19)?  To be considered HIGH RISK for Coronavirus (COVID-19), you have to meet the following criteria:  . Traveled to [place], [place], [place], [place] or [place]; or in the [place] to [place], [place], [place], or [place]; and have fever, cough, and shortness of breath within the last 2 weeks of travel OR . Been in close contact with a person diagnosed with COVID-19 within the last 2 weeks and have fever, cough, and shortness of breath . IF YOU DO NOT MEET THESE CRITERIA, YOU ARE CONSIDERED LOW RISK FOR COVID-19.  What to do if you are HIGH RISK for COVID-19?  Marland Kitchen If you are having a medical emergency, call 911. . Seek medical care right away. Before you go to a doctor's office, urgent care or emergency department, call ahead and tell them  about your recent travel, contact with someone diagnosed with COVID-19, and your symptoms. You should receive instructions from your physician's office regarding next steps of care.  . When you arrive at healthcare provider, tell the healthcare staff immediately you have returned from visiting [place], [place], [place], [place] or [place]; or traveled in the [place] to [place], [place], [place], or [place]; in the last two weeks or you have been in close contact with a person diagnosed with COVID-19 in the last 2 weeks.   . Tell the health care staff about your symptoms: fever, cough and shortness of breath. . After you have been seen by a medical provider, you will be either: o Tested for (COVID-19) and discharged home on quarantine except to seek medical care if symptoms worsen, and asked to  - Stay home and avoid contact with others until you get your results (4-5 days)  - Avoid travel on public transportation if possible (such as bus, train, or airplane) or o Sent to the Emergency Department by EMS for evaluation, COVID-19 testing, and possible admission depending on your condition and test results.  What to do if you are LOW RISK for COVID-19?  Reduce your risk of any infection by using the same precautions used for avoiding the common cold or flu:  Marland Kitchen Wash your hands often with soap and warm water for at least 20 seconds.  If soap and water are not readily available, use an alcohol-based hand sanitizer with at least 60% alcohol.  . If coughing or sneezing,  cover your mouth and nose by coughing or sneezing into the elbow areas of your shirt or coat, into a tissue or into your sleeve (not your hands). . Avoid shaking hands with others and consider head nods or verbal greetings only. . Avoid touching your eyes, nose, or mouth with unwashed hands.  . Avoid close contact with people who are sick. . Avoid places or events with large numbers of people in one location, like concerts or  sporting events. . Carefully consider travel plans you have or are making. . If you are planning any travel outside or inside the [name], visit the CDC's Travelers' Health webpage for the latest health notices. . If you have some symptoms but not all symptoms, continue to monitor at home and seek medical attention if your symptoms worsen. . If you are having a medical emergency, call 911.   [place] / e-Visit: [email or web address]         [facility]: [facility]: [phone]                   [facility]: [phone]

## 2019-07-23 ENCOUNTER — Encounter: Payer: Self-pay | Admitting: Hematology

## 2019-07-24 ENCOUNTER — Other Ambulatory Visit: Payer: Self-pay | Admitting: *Deleted

## 2019-07-24 LAB — MULTIPLE MYELOMA PANEL, SERUM
Albumin SerPl Elph-Mcnc: 3.2 g/dL (ref 2.9–4.4)
Albumin/Glob SerPl: 1.2 (ref 0.7–1.7)
Alpha 1: 0.2 g/dL (ref 0.0–0.4)
Alpha2 Glob SerPl Elph-Mcnc: 0.9 g/dL (ref 0.4–1.0)
B-Globulin SerPl Elph-Mcnc: 1 g/dL (ref 0.7–1.3)
Gamma Glob SerPl Elph-Mcnc: 0.7 g/dL (ref 0.4–1.8)
Globulin, Total: 2.8 g/dL (ref 2.2–3.9)
IgA: 369 mg/dL (ref 64–422)
IgG (Immunoglobin G), Serum: 761 mg/dL (ref 586–1602)
IgM (Immunoglobulin M), Srm: 22 mg/dL — ABNORMAL LOW (ref 26–217)
Total Protein ELP: 6 g/dL (ref 6.0–8.5)

## 2019-07-24 NOTE — Telephone Encounter (Signed)
Requested refill of Fentanyl patch 

## 2019-07-25 ENCOUNTER — Other Ambulatory Visit: Payer: Self-pay | Admitting: Hematology

## 2019-07-25 LAB — HM DIABETES EYE EXAM

## 2019-07-25 MED ORDER — ONDANSETRON HCL 8 MG PO TABS: 8.0000 mg | ORAL_TABLET | Freq: Two times a day (BID) | ORAL | 1 refills | Status: DC | PRN

## 2019-07-25 MED ORDER — FENTANYL 12 MCG/HR TD PT72: 1.0000 | MEDICATED_PATCH | TRANSDERMAL | 0 refills | Status: DC

## 2019-07-25 MED ORDER — OXYCODONE HCL 10 MG PO TABS: 10.0000 mg | ORAL_TABLET | Freq: Four times a day (QID) | ORAL | 0 refills | Status: DC | PRN

## 2019-07-25 NOTE — Addendum Note (Signed)
Addended by: Sullivan Lone on: 07/25/2019 05:11 PM   Modules accepted: Orders

## 2019-07-26 ENCOUNTER — Encounter: Payer: Self-pay | Admitting: *Deleted

## 2019-07-26 ENCOUNTER — Encounter: Payer: Self-pay | Admitting: Internal Medicine

## 2019-07-26 NOTE — Progress Notes (Signed)
Abstracted and sent to scan  

## 2019-07-27 ENCOUNTER — Inpatient Hospital Stay: Payer: Medicare Other

## 2019-07-27 ENCOUNTER — Telehealth: Payer: Self-pay | Admitting: Hematology

## 2019-07-27 ENCOUNTER — Other Ambulatory Visit: Payer: Self-pay

## 2019-07-27 VITALS — BP 138/72 | HR 86 | Temp 98.2°F | Resp 17

## 2019-07-27 DIAGNOSIS — Z7189 Other specified counseling: Secondary | ICD-10-CM

## 2019-07-27 DIAGNOSIS — C9000 Multiple myeloma not having achieved remission: Secondary | ICD-10-CM

## 2019-07-27 DIAGNOSIS — Z5112 Encounter for antineoplastic immunotherapy: Secondary | ICD-10-CM | POA: Diagnosis not present

## 2019-07-27 DIAGNOSIS — Z95828 Presence of other vascular implants and grafts: Secondary | ICD-10-CM

## 2019-07-27 LAB — CBC WITH DIFFERENTIAL/PLATELET
Abs Immature Granulocytes: 0.08 10*3/uL — ABNORMAL HIGH (ref 0.00–0.07)
Basophils Absolute: 0.1 10*3/uL (ref 0.0–0.1)
Basophils Relative: 1 %
Eosinophils Absolute: 0.5 10*3/uL (ref 0.0–0.5)
Eosinophils Relative: 6 %
HCT: 37.3 % (ref 36.0–46.0)
Hemoglobin: 12.3 g/dL (ref 12.0–15.0)
Immature Granulocytes: 1 %
Lymphocytes Relative: 19 %
Lymphs Abs: 1.5 10*3/uL (ref 0.7–4.0)
MCH: 31.8 pg (ref 26.0–34.0)
MCHC: 33 g/dL (ref 30.0–36.0)
MCV: 96.4 fL (ref 80.0–100.0)
Monocytes Absolute: 1.8 10*3/uL — ABNORMAL HIGH (ref 0.1–1.0)
Monocytes Relative: 23 %
Neutro Abs: 4 10*3/uL (ref 1.7–7.7)
Neutrophils Relative %: 50 %
Platelets: 151 10*3/uL (ref 150–400)
RBC: 3.87 MIL/uL (ref 3.87–5.11)
RDW: 13.7 % (ref 11.5–15.5)
WBC: 7.9 10*3/uL (ref 4.0–10.5)
nRBC: 0 % (ref 0.0–0.2)

## 2019-07-27 LAB — CMP (CANCER CENTER ONLY)
ALT: 20 U/L (ref 0–44)
AST: 16 U/L (ref 15–41)
Albumin: 3.2 g/dL — ABNORMAL LOW (ref 3.5–5.0)
Alkaline Phosphatase: 73 U/L (ref 38–126)
Anion gap: 11 (ref 5–15)
BUN: 11 mg/dL (ref 8–23)
CO2: 22 mmol/L (ref 22–32)
Calcium: 9.4 mg/dL (ref 8.9–10.3)
Chloride: 105 mmol/L (ref 98–111)
Creatinine: 0.72 mg/dL (ref 0.44–1.00)
GFR, Est AFR Am: >60 mL/min (ref >60)
GFR, Estimated: >60 mL/min (ref >60)
Glucose, Bld: 185 mg/dL — ABNORMAL HIGH (ref 70–99)
Potassium: 3.4 mmol/L — ABNORMAL LOW (ref 3.5–5.1)
Sodium: 138 mmol/L (ref 135–145)
Total Bilirubin: 0.7 mg/dL (ref 0.3–1.2)
Total Protein: 6.1 g/dL — ABNORMAL LOW (ref 6.5–8.1)

## 2019-07-27 MED ORDER — FAMOTIDINE 20 MG PO TABS
20.0000 mg | ORAL_TABLET | Freq: Once | ORAL | Status: AC
  Administered 2019-07-27: 20 mg via ORAL

## 2019-07-27 MED ORDER — DEXTROSE 5 % IV SOLN
27.0000 mg/m2 | Freq: Once | INTRAVENOUS | Status: AC
  Administered 2019-07-27: 50 mg via INTRAVENOUS
  Filled 2019-07-27: qty 15

## 2019-07-27 MED ORDER — DIPHENHYDRAMINE HCL 25 MG PO CAPS
ORAL_CAPSULE | ORAL | Status: AC
  Filled 2019-07-27: qty 1

## 2019-07-27 MED ORDER — DEXAMETHASONE 4 MG PO TABS
ORAL_TABLET | ORAL | Status: AC
  Filled 2019-07-27: qty 5

## 2019-07-27 MED ORDER — PROCHLORPERAZINE MALEATE 10 MG PO TABS
ORAL_TABLET | ORAL | Status: AC
  Filled 2019-07-27: qty 1

## 2019-07-27 MED ORDER — SODIUM CHLORIDE 0.9 % IV SOLN
Freq: Once | INTRAVENOUS | Status: DC
  Filled 2019-07-27: qty 250

## 2019-07-27 MED ORDER — SODIUM CHLORIDE 0.9% FLUSH
10.0000 mL | INTRAVENOUS | Status: DC | PRN
  Administered 2019-07-27: 13:00:00 10 mL
  Filled 2019-07-27: qty 10

## 2019-07-27 MED ORDER — FAMOTIDINE 20 MG PO TABS
ORAL_TABLET | ORAL | Status: AC
  Filled 2019-07-27: qty 1

## 2019-07-27 MED ORDER — SODIUM CHLORIDE 0.9% FLUSH
10.0000 mL | Freq: Once | INTRAVENOUS | Status: AC
  Administered 2019-07-27: 10 mL
  Filled 2019-07-27: qty 10

## 2019-07-27 MED ORDER — DEXAMETHASONE 4 MG PO TABS
20.0000 mg | ORAL_TABLET | Freq: Once | ORAL | Status: AC
  Administered 2019-07-27: 20 mg via ORAL

## 2019-07-27 MED ORDER — PROCHLORPERAZINE MALEATE 10 MG PO TABS
10.0000 mg | ORAL_TABLET | Freq: Once | ORAL | Status: AC
  Administered 2019-07-27: 10 mg via ORAL

## 2019-07-27 MED ORDER — HEPARIN SOD (PORK) LOCK FLUSH 100 UNIT/ML IV SOLN
500.0000 [IU] | Freq: Once | INTRAVENOUS | Status: AC | PRN
  Administered 2019-07-27: 500 [IU]
  Filled 2019-07-27: qty 5

## 2019-07-27 MED ORDER — DIPHENHYDRAMINE HCL 25 MG PO TABS
25.0000 mg | ORAL_TABLET | Freq: Once | ORAL | Status: AC
  Administered 2019-07-27: 25 mg via ORAL
  Filled 2019-07-27: qty 1

## 2019-07-27 MED ORDER — ACETAMINOPHEN 325 MG PO TABS
650.0000 mg | ORAL_TABLET | Freq: Once | ORAL | Status: AC
  Administered 2019-07-27: 650 mg via ORAL

## 2019-07-27 MED ORDER — SODIUM CHLORIDE 0.9 % IV SOLN
Freq: Once | INTRAVENOUS | Status: AC
  Administered 2019-07-27: 12:00:00 via INTRAVENOUS
  Filled 2019-07-27: qty 250

## 2019-07-27 MED ORDER — ACETAMINOPHEN 325 MG PO TABS
ORAL_TABLET | ORAL | Status: AC
  Filled 2019-07-27: qty 2

## 2019-07-27 NOTE — Patient Instructions (Signed)
[facility] Discharge Instructions for Patients Receiving Chemotherapy  Today you received the following chemotherapy agents:  Kyprolis  To help prevent nausea and vomiting after your treatment, we encourage you to take your nausea medication as directed.   If you develop nausea and vomiting that is not controlled by your nausea medication, call the clinic.   BELOW ARE SYMPTOMS THAT SHOULD BE REPORTED IMMEDIATELY:  *FEVER GREATER THAN 100.5 F  *CHILLS WITH OR WITHOUT FEVER  NAUSEA AND VOMITING THAT IS NOT CONTROLLED WITH YOUR NAUSEA MEDICATION  *UNUSUAL SHORTNESS OF BREATH  *UNUSUAL BRUISING OR BLEEDING  TENDERNESS IN MOUTH AND THROAT WITH OR WITHOUT PRESENCE OF ULCERS  *URINARY PROBLEMS  *BOWEL PROBLEMS  UNUSUAL RASH Items with * indicate a potential emergency and should be followed up as soon as possible.  Feel free to call the clinic should you have any questions or concerns. The clinic phone number is [phone].  Please show the CHEMO ALERT CARD at check-in to the Emergency Department and triage nurse.   

## 2019-07-27 NOTE — Telephone Encounter (Signed)
R/s appt per 8/13 sch message - pt is aware of change

## 2019-07-28 ENCOUNTER — Other Ambulatory Visit: Payer: Self-pay

## 2019-07-28 ENCOUNTER — Encounter: Payer: Self-pay | Admitting: Hematology

## 2019-07-28 ENCOUNTER — Other Ambulatory Visit: Payer: Self-pay | Admitting: Hematology

## 2019-07-28 ENCOUNTER — Inpatient Hospital Stay: Payer: Medicare Other

## 2019-07-28 VITALS — BP 126/60 | HR 87 | Temp 97.8°F | Resp 18

## 2019-07-28 DIAGNOSIS — Z5112 Encounter for antineoplastic immunotherapy: Secondary | ICD-10-CM | POA: Diagnosis not present

## 2019-07-28 DIAGNOSIS — C9000 Multiple myeloma not having achieved remission: Secondary | ICD-10-CM

## 2019-07-28 DIAGNOSIS — Z7189 Other specified counseling: Secondary | ICD-10-CM

## 2019-07-28 MED ORDER — FAMOTIDINE 20 MG PO TABS
ORAL_TABLET | ORAL | Status: AC
  Filled 2019-07-28: qty 1

## 2019-07-28 MED ORDER — ACETAMINOPHEN 325 MG PO TABS
ORAL_TABLET | ORAL | Status: AC
  Filled 2019-07-28: qty 2

## 2019-07-28 MED ORDER — SODIUM CHLORIDE 0.9 % IV SOLN
Freq: Once | INTRAVENOUS | Status: DC
  Filled 2019-07-28: qty 250

## 2019-07-28 MED ORDER — DEXTROSE 5 % IV SOLN
27.0000 mg/m2 | Freq: Once | INTRAVENOUS | Status: AC
  Administered 2019-07-28: 50 mg via INTRAVENOUS
  Filled 2019-07-28: qty 15

## 2019-07-28 MED ORDER — SODIUM CHLORIDE 0.9 % IV SOLN
Freq: Once | INTRAVENOUS | Status: AC
  Administered 2019-07-28: 12:00:00 via INTRAVENOUS
  Filled 2019-07-28: qty 250

## 2019-07-28 MED ORDER — PROCHLORPERAZINE MALEATE 10 MG PO TABS
ORAL_TABLET | ORAL | Status: AC
  Filled 2019-07-28: qty 1

## 2019-07-28 MED ORDER — HEPARIN SOD (PORK) LOCK FLUSH 100 UNIT/ML IV SOLN
500.0000 [IU] | Freq: Once | INTRAVENOUS | Status: AC | PRN
  Administered 2019-07-28: 500 [IU]
  Filled 2019-07-28: qty 5

## 2019-07-28 MED ORDER — ACETAMINOPHEN 325 MG PO TABS
650.0000 mg | ORAL_TABLET | Freq: Once | ORAL | Status: AC
  Administered 2019-07-28: 650 mg via ORAL

## 2019-07-28 MED ORDER — FAMOTIDINE 20 MG PO TABS
20.0000 mg | ORAL_TABLET | Freq: Once | ORAL | Status: AC
  Administered 2019-07-28: 20 mg via ORAL

## 2019-07-28 MED ORDER — SODIUM CHLORIDE 0.9% FLUSH
10.0000 mL | INTRAVENOUS | Status: DC | PRN
  Administered 2019-07-28: 10 mL
  Filled 2019-07-28: qty 10

## 2019-07-28 MED ORDER — DIPHENHYDRAMINE HCL 25 MG PO TABS
25.0000 mg | ORAL_TABLET | Freq: Once | ORAL | Status: AC
  Administered 2019-07-28: 25 mg via ORAL
  Filled 2019-07-28: qty 1

## 2019-07-28 MED ORDER — DEXAMETHASONE 4 MG PO TABS
ORAL_TABLET | ORAL | Status: AC
  Filled 2019-07-28: qty 5

## 2019-07-28 MED ORDER — DEXAMETHASONE 4 MG PO TABS
20.0000 mg | ORAL_TABLET | Freq: Once | ORAL | Status: AC
  Administered 2019-07-28: 20 mg via ORAL

## 2019-07-28 MED ORDER — DIPHENHYDRAMINE HCL 25 MG PO CAPS
ORAL_CAPSULE | ORAL | Status: AC
  Filled 2019-07-28: qty 1

## 2019-07-28 MED ORDER — PROCHLORPERAZINE MALEATE 10 MG PO TABS
10.0000 mg | ORAL_TABLET | Freq: Once | ORAL | Status: AC
  Administered 2019-07-28: 10 mg via ORAL

## 2019-07-28 NOTE — Patient Instructions (Signed)
[facility] Discharge Instructions for Patients Receiving Chemotherapy  Today you received the following chemotherapy agents:  Kyprolis  To help prevent nausea and vomiting after your treatment, we encourage you to take your nausea medication as directed.   If you develop nausea and vomiting that is not controlled by your nausea medication, call the clinic.   BELOW ARE SYMPTOMS THAT SHOULD BE REPORTED IMMEDIATELY:  *FEVER GREATER THAN 100.5 F  *CHILLS WITH OR WITHOUT FEVER  NAUSEA AND VOMITING THAT IS NOT CONTROLLED WITH YOUR NAUSEA MEDICATION  *UNUSUAL SHORTNESS OF BREATH  *UNUSUAL BRUISING OR BLEEDING  TENDERNESS IN MOUTH AND THROAT WITH OR WITHOUT PRESENCE OF ULCERS  *URINARY PROBLEMS  *BOWEL PROBLEMS  UNUSUAL RASH Items with * indicate a potential emergency and should be followed up as soon as possible.  Feel free to call the clinic should you have any questions or concerns. The clinic phone number is [phone].  Please show the CHEMO ALERT CARD at check-in to the Emergency Department and triage nurse.   

## 2019-07-30 ENCOUNTER — Other Ambulatory Visit: Payer: Self-pay | Admitting: Hematology

## 2019-07-30 DIAGNOSIS — C9000 Multiple myeloma not having achieved remission: Secondary | ICD-10-CM

## 2019-07-31 ENCOUNTER — Encounter: Payer: Self-pay | Admitting: Hematology

## 2019-07-31 ENCOUNTER — Other Ambulatory Visit: Payer: Self-pay | Admitting: *Deleted

## 2019-07-31 DIAGNOSIS — C9000 Multiple myeloma not having achieved remission: Secondary | ICD-10-CM

## 2019-07-31 MED ORDER — LENALIDOMIDE 15 MG PO CAPS: 15.0000 mg | ORAL_CAPSULE | Freq: Every day | ORAL | 0 refills | Status: DC

## 2019-07-31 NOTE — Telephone Encounter (Signed)
Refill Revlimid per Dr. Irene Limbo OV note from 07/20/2019 Refill sent to [facility]) [place], [place] Auth# [id number], 07/31/2019

## 2019-07-31 NOTE — Telephone Encounter (Signed)
Dr. Irene Limbo patient

## 2019-08-01 ENCOUNTER — Other Ambulatory Visit: Payer: Self-pay | Admitting: Hematology

## 2019-08-01 ENCOUNTER — Telehealth: Payer: Self-pay | Admitting: *Deleted

## 2019-08-01 ENCOUNTER — Encounter: Payer: Self-pay | Admitting: *Deleted

## 2019-08-01 NOTE — Telephone Encounter (Signed)
[facility] office. Patient has appt w/Dr. Gloriann Loan 08/16/2019. Faxed Referral, Dr. Irene Limbo OV note 07/20/2019 and  PET results from 05/31/2019 to [phone]. Fax confirmation received. Contacted office of Dr. Melony Overly (ENT). Faxed Referral,  Dr. Irene Limbo OV note 07/20/2019 and  PET results from 05/31/2019 to [phone]. Fax confirmation received.

## 2019-08-04 ENCOUNTER — Telehealth: Payer: Self-pay | Admitting: *Deleted

## 2019-08-04 ENCOUNTER — Encounter: Payer: Self-pay | Admitting: Hematology

## 2019-08-04 NOTE — Telephone Encounter (Signed)
Contacted patient to review upcoming schedule of appointments. All dates and times given to patient. Patient verbalized understanding

## 2019-08-07 ENCOUNTER — Other Ambulatory Visit: Payer: Self-pay | Admitting: Internal Medicine

## 2019-08-07 DIAGNOSIS — Z1231 Encounter for screening mammogram for malignant neoplasm of breast: Secondary | ICD-10-CM

## 2019-08-08 ENCOUNTER — Telehealth: Payer: Self-pay | Admitting: *Deleted

## 2019-08-08 NOTE — Telephone Encounter (Signed)
Received fax from Dr. Radene Journey office - patient has appt on 08/10/2019 at 2:30pm.

## 2019-08-10 ENCOUNTER — Other Ambulatory Visit: Payer: Self-pay | Admitting: Internal Medicine

## 2019-08-17 ENCOUNTER — Encounter: Payer: Self-pay | Admitting: Hematology

## 2019-08-17 ENCOUNTER — Inpatient Hospital Stay: Payer: Medicare Other

## 2019-08-17 ENCOUNTER — Telehealth: Payer: Self-pay | Admitting: *Deleted

## 2019-08-17 ENCOUNTER — Inpatient Hospital Stay: Payer: Medicare Other | Admitting: Hematology

## 2019-08-17 ENCOUNTER — Other Ambulatory Visit: Payer: Self-pay | Admitting: Hematology

## 2019-08-17 ENCOUNTER — Telehealth: Payer: Self-pay | Admitting: Hematology

## 2019-08-17 NOTE — Telephone Encounter (Signed)
Contacted patient in response to MyChart message regarding appointment changes. Reviewed treatment cycle and appointment changes with patient that occurred when she took a break due to going out of town. Patient verbalized understanding. Mailed Calendar of new appointments for September to patient at her request.

## 2019-08-17 NOTE — Telephone Encounter (Signed)
I talk with patient regarding schedule starting with 9/14

## 2019-08-18 ENCOUNTER — Inpatient Hospital Stay: Payer: Medicare Other

## 2019-08-18 ENCOUNTER — Telehealth: Payer: Self-pay | Admitting: *Deleted

## 2019-08-18 NOTE — Telephone Encounter (Signed)
Dr. Irene Limbo response to My Chart question regarding steroids: Take dose for this week. Next week's dose will be part of treatment.per Dr. Irene Limbo. Patient verbalized understanding.

## 2019-08-20 ENCOUNTER — Other Ambulatory Visit: Payer: Self-pay | Admitting: Hematology

## 2019-08-20 DIAGNOSIS — Z7189 Other specified counseling: Secondary | ICD-10-CM

## 2019-08-20 DIAGNOSIS — C9000 Multiple myeloma not having achieved remission: Secondary | ICD-10-CM

## 2019-08-24 ENCOUNTER — Other Ambulatory Visit: Payer: Medicare Other

## 2019-08-24 ENCOUNTER — Ambulatory Visit: Payer: Medicare Other

## 2019-08-25 ENCOUNTER — Ambulatory Visit: Payer: Medicare Other

## 2019-08-26 ENCOUNTER — Other Ambulatory Visit: Payer: Self-pay | Admitting: Hematology

## 2019-08-26 DIAGNOSIS — C9000 Multiple myeloma not having achieved remission: Secondary | ICD-10-CM

## 2019-08-27 NOTE — Progress Notes (Signed)
HEMATOLOGY/ONCOLOGY CLINIC NOTE  Date of Service: 08/28/2019  Patient Care Team: Hoyt Koch, MD as PCP - General (Internal Medicine) Lafayette Dragon, MD (Inactive) as Consulting Physician (Gastroenterology) Megan Salon, MD as Consulting Physician (Gynecology) Melrose Nakayama, MD as Consulting Physician (Orthopedic Surgery) Deneise Lever, MD as Consulting Physician (Pulmonary Disease) [name], MD (Ophthalmology)  CHIEF COMPLAINTS/PURPOSE OF CONSULTATION:  Continue mx of myeloma  HISTORY OF PRESENTING ILLNESS:   Faith Orr is a wonderful 78 y.o. female who has been referred to Faith Orr by Dr. Pricilla Holm for evaluation and management of Lytic bone lesions. She is accompanied today by her son in law, and her partner is present via phone. The pt reports that she is doing well overall.   The pt notes that 2-3 months ago while standing at the stove, and otherwise feeling normally, she felt "something snap that took her breath away" in her mid back as she stretched to get something. The pt notes that she saw a chiropractor twice due to her back stiffness, which did not help. The pt then described her new bone pains to her PCP on 12/05/18, and subsequent imaging, as noted below, revealed concerns for numerous bone lesions. She has begun 79m Fosamax. The pt notes that she had hip pain in 2018, and that an XR at that time did not reveal any lesions.  The pt notes that most of her pain is concentrated to her left shoulder presently, and with minimal movement of the arm. She endorses pain radiating into her left arm and notes that her hand is swollen in the mornings when she wakes up. She also endorses present back pain and right hip pain, worse when she walks. She has not yet seen orthopedics. The pt reports that she is needing to take 6068mAdvil every 4-6 hours as Tramadol alone has not been able to alleviate her pain. The pt denies any unexpected weight loss, fevers,  chills, or night sweats. The pt notes that her urine is a very dark color presently, but denies overt blood in the urine, underpants, nor tissue paper. The pt notes that in the last two weeks she has had some soreness in her head, but denies new headaches or changes in vision. The pt notes that she has been urinating more frequently overall, but has been trying to stay better hydrated as well.  The pt notes that she has been compliant with annual mammograms.   The pt notes that she had a cyst in her right breast which was removed in the past. She fractured her left wrist in 2008 after falling down stairs. The pt endorses history of fatty liver.   The pt denies ever smoking cigarettes and endorses significant second hand smoke exposure with a previous marriage.   Of note prior to the patient's visit today, pt has had a Bone Scan completed on 12/13/18 with results revealing Multifocal uptake throughout the skeleton, consistent with diffuse metastatic disease. Primary tumor is not specified. 2. Uptake in the proximal right femur, consistent with lytic lesions. 3. Uptake in the ribs bilaterally as described. 4. Lesions in the proximal left humerus. 5. Diffuse uptake throughout the skull consistent with metastatic disease. 6. Right paramedian uptake at the manubrium.  Most recent lab results (12/08/18) of CBC w/diff and CMP is as follows: all values are WNL except for Glucose at 279, BUN at 24, AST at 41, ALT at 46. 12/08/18 SPEP revealed all values WNL except for Total  Protein at 6.0, Albumin at 3.6, Gamma globulin at 0.7, and M spike at 0.5g  On review of systems, pt reports significant left shoulder pain, back pain, right hip pain, dark urine, and denies fevers, chills, night sweats, unexpected weight loss, changes in bowel habits, changes in breathing, cough, new respiratory symptoms, changes in vision, abdominal pains, leg swelling, and any other symptoms.   On PMHx the pt reports fatty liver, and  denies blood clots.  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 Denies ever smoking.  On Family Hx the pt reports maternal grandmother with colon cancer. Father with bladder cancer and amyloidosis (pt notes that this could have been misdiagnosis). Mother with Protein S deficiency and polymyalgia rheumatica.  Interval History:   Faith Orr returns today for management and evaluation of her newly diagnosed Multiple Myeloma and C8D1 of Carfilzomib, Revlimid, and Dexamethasone. The patient's last visit with Faith Orr was on 07/20/2019. The pt reports that she is doing well overall.  The pt reports that she saw Dr. Norma Fredrickson at [facility] on 08/14/2019 about transplant options. Pt does not think that she has the emotional strength to get through a transplant and the subsequent aftercare. Pt reports that the mental and emotional barriers to transplant were not discussed during her visit. She does not have a follow-up with the transplant team at this time. She currently does not want a transplant and she doesn't feel like any new information would change her mind. She concedes that the entire conversation was overwhelming to her, as well as her treatment overall in a broader sense.  She reports that she recently went to the beach in the time between her treatments. She began to experience left upper abdominal pain while at the beach. She did not have a fever but fluctuated between feeling hot and cold. She also reports that she had SOB when walking and she developed a rash on her legs. She also felt SOB last night that is better today. She is unsure if her SOB is caused by anxiety and is only worsed when she takes deep breaths. She has been using a new soap in the interim which could have contributed to her rash. She is no longer using that soap and has not put any topicals on the rash but continues to take her prescribed prophylactic antihistamines daily. Her rash is  clearing upon inspection today.   She has been noticing cognitive impairment and has been having issues with recall and sequencing. This was not improved with the break in treatment. Pt has been experiencing recent nausea and lack of appetite but has been sleeping well. She has been walking about 20-30 mins per day at least 4-5x per week and reports high activity around her home. Her family has also been doing good and everything is stable with her spouse. Pt reports that she is taking her Asprin and Acyclovir regularly.   Pt saw her Urologist for urinary stone and will repeat CT Scan on 09/06/2019.  Pt has not seen a Psycologist or anyone to assist her with her mental health. She is open to receiving that type of help.   Lab results today (08/28/19) of CBC w/diff and CMP is as follows: all values are WNL except for Monocytes Abs at 1.1, Potassium at 3.2, Glucose at 206, Albumin at 3.1. 07/20/2019 MMP is as follows: IgG at 761, IgA at 369, IgM at 22, Total Protein at 6.0, Albumin at 3.2,  Alpha 1 at 0.2, Alpha2 at 0.9, B-Globulin at 1.0, Gamma Glob at 0.7, M Protein is "Not Observed", Total Globulin at 2.8, Albumin/Glob at 1.2, IFE revealed " The immunofixation pattern appears unremarkable. Evidence of monoclonal protein is not apparent."  On review of systems, pt reports fever, rash and denies sore throats, cough, trouble passing urine and any other symptoms.  MEDICAL HISTORY:  Past Medical History:  Diagnosis Date   Allergy    seasonal   Asthma    DEPRESSION    DIABETES MELLITUS, TYPE II    Diverticulosis    HYPERLIPIDEMIA    Macular degeneration of left eye    mild, Dr.Hecker   Obesity, unspecified    Osteoarthritis of both knees    OSTEOPENIA    Osteopenia    URINARY INCONTINENCE     SURGICAL HISTORY: Past Surgical History:  Procedure Laterality Date   BREAST SURGERY     CATARACT EXTRACTION Left 05/24/2018   CESAREAN SECTION  01/1973   FRACTURE SURGERY     IR  IMAGING GUIDED PORT INSERTION  02/20/2019   left wrist surgery  2008   By Dr. Latanya Maudlin   right ankle  1994    SOCIAL HISTORY: Social History   Socioeconomic History   Marital status: Married    Spouse name: Not on file   Number of children: 1   Years of education: Not on file   Highest education level: Not on file  Occupational History    Employer: [employer] [place] resource strain: Patient refused   Food insecurity    Worry: Patient refused    Inability: Patient refused   Transportation needs    Medical: Patient refused    Non-medical: Patient refused  Tobacco Use   Smoking status: Never Smoker   Smokeless tobacco: Never Used   Tobacco comment: Lives with partner [name]) and son  Substance and Sexual Activity   Alcohol use: No    Alcohol/week: 0.0 standard drinks   Drug use: No   Sexual activity: Never    Partners: Female    Birth control/protection: Post-menopausal    Comment: Lives with female partner (annette hicks) and 22 yo son  Lifestyle   Physical activity    Days per week: Patient refused    Minutes per session: Patient refused   Stress: Patient refused  Relationships   Social connections    Talks on phone: Patient refused    Gets together: Patient refused    Attends religious service: Patient refused    Active member of club or organization: Patient refused    Attends meetings of clubs or organizations: Patient refused    Relationship status: Patient refused   Intimate partner violence    Fear of current or ex partner: Patient refused    Emotionally abused: Patient refused    Physically abused: Patient refused    Forced sexual activity: Patient refused  Other Topics Concern   Not on file  Social History Narrative   Not on file    FAMILY HISTORY: Family History  Problem Relation Age of Onset   Diabetes Father    Hyperlipidemia Father    Heart disease Father    Cancer Father     Hypertension Father    Colon cancer Paternal Grandmother 76   Osteoporosis Mother    Protein S deficiency Mother    Hyperlipidemia Mother    Multiple sclerosis Daughter    Cancer Other  bladder   Breast cancer Neg Hx     ALLERGIES:  is allergic to penicillins; aleve [naproxen sodium]; and sulfonamide derivatives.  MEDICATIONS:  Current Outpatient Medications  Medication Sig Dispense Refill   acyclovir (ZOVIRAX) 400 MG tablet TAKE 1 TABLET(400 MG) BY MOUTH TWICE DAILY 60 tablet 0   albuterol (PROVENTIL HFA;VENTOLIN HFA) 108 (90 Base) MCG/ACT inhaler INHALE 1 TO 2 PUFFS INTO THE LUNGS EVERY 6 HOURS AS NEEDED FOR WHEEZING OR SHORTNESS OF BREATH (Patient not taking: No sig reported) 54 g 1   aspirin EC 81 MG tablet Take 81 mg by mouth daily after breakfast.      Blood Glucose Monitoring Suppl (FREESTYLE FREEDOM LITE) W/DEVICE KIT Use to check blood sugars twice a day Dx 250.00 1 each 0   Calcium Carbonate-Vitamin D (CALCIUM 600+D HIGH POTENCY) 600-400 MG-UNIT per tablet Take 1 tablet by mouth 2 (two) times daily.      Cetirizine HCl 10 MG CAPS Take 1 capsule (10 mg total) by mouth daily. 30 capsule 1   clotrimazole-betamethasone (LOTRISONE) cream Apply 1 application topically 2 (two) times daily. Over left arm rash 30 g 0   Continuous Blood Gluc Sensor (FREESTYLE LIBRE 14 DAY SENSOR) MISC 1 each by Does not apply route every 14 (fourteen) days. 2 each 11   dexamethasone (DECADRON) 4 MG tablet Take 5 tablets (20 mg total) by mouth once a week. On D22 of each cycle of treatment 20 tablet 5   DOK 100 MG capsule TAKE 2 CAPSULES BY MOUTH AT BEDTIME 60 capsule 1   fentaNYL (DURAGESIC) 12 MCG/HR Place 1 patch onto the skin every 3 (three) days. 10 patch 0   fexofenadine (ALLEGRA) 60 MG tablet Take 1 tablet (60 mg total) by mouth 2 (two) times daily. 60 tablet 1   fluticasone (FLONASE) 50 MCG/ACT nasal spray Place 1 spray into both nostrils daily. (Patient taking  differently: Place 1 spray into both nostrils daily as needed for allergies or rhinitis. ) 16 g 2   glipiZIDE (GLUCOTROL XL) 5 MG 24 hr tablet TAKE 1 TABLET(5 MG) BY MOUTH DAILY WITH BREAKFAST 90 tablet 1   glucose blood (FREESTYLE LITE) test strip CHECK BLOOD SUGAR TWICE DAILY AS DIRECTED Dx 250.00 180 each 3   Lancets (FREESTYLE) lancets Use twice daily to check sugars. 100 each 11   lenalidomide (REVLIMID) 15 MG capsule Take 1 capsule (15 mg total) by mouth daily. Take for 21 days on, 7 days off, repeat every 28 days. 21 capsule 0   lidocaine-prilocaine (EMLA) cream APPLY 1 APPLICATION TO THE AFFECTED AREA AS NEEDED. USE PRIOR TO PORT ACCESS 30 g 0   LORazepam (ATIVAN) 0.5 MG tablet Take 1 tablet (0.5 mg total) by mouth every 8 (eight) hours as needed for anxiety (significant essential tremors). 60 tablet 0   metFORMIN (GLUCOPHAGE-XR) 500 MG 24 hr tablet TAKE 3 TABLETS(1500 MG) BY MOUTH DAILY WITH BREAKFAST 270 tablet 1   montelukast (SINGULAIR) 10 MG tablet TAKE 1 TABLET BY MOUTH DAILY AS NEEDED (Patient taking differently: Take 10 mg by mouth at bedtime as needed (allergies). ) 30 tablet 3   Multiple Vitamins-Minerals (ICAPS) CAPS Take 1 capsule by mouth daily after breakfast.      ondansetron (ZOFRAN) 8 MG tablet Take 1 tablet (8 mg total) by mouth 2 (two) times daily as needed (Nausea or vomiting). 30 tablet 1   Oxycodone HCl 10 MG TABS Take 1 tablet (10 mg total) by mouth every 6 (six) hours as needed.  90 tablet 0   pantoprazole (PROTONIX) 20 MG tablet TAKE 1 TABLET(20 MG) BY MOUTH DAILY (Patient not taking: No sig reported) 30 tablet 5   polyethylene glycol (MIRALAX / GLYCOLAX) packet Take 17 g by mouth daily after breakfast.      prochlorperazine (COMPAZINE) 10 MG tablet Take 1 tablet (10 mg total) by mouth every 6 (six) hours as needed (Nausea or vomiting). 30 tablet 1   sertraline (ZOLOFT) 50 MG tablet TAKE 1 TABLET BY MOUTH DAILY 90 tablet 1   simvastatin (ZOCOR) 20 MG  tablet TAKE 1 TABLET(20 MG) BY MOUTH DAILY 90 tablet 1   Triamcinolone Acetonide 0.025 % LOTN Apply 1 application topically 3 (three) times daily as needed (rash/itching). 60 mL 2   Vitamin D, Ergocalciferol, (DRISDOL) 1.25 MG (50000 UT) CAPS capsule TAKE 1 CAPSULE BY MOUTH EVERY 7 DAYS 12 capsule 0   No current facility-administered medications for this visit.    Facility-Administered Medications Ordered in Other Visits  Medication Dose Route Frequency Provider Last Rate Last Dose   heparin lock flush 100 unit/mL  500 Units Intracatheter Once PRN Brunetta Genera, MD        REVIEW OF SYSTEMS:    A 10+ POINT REVIEW OF SYSTEMS WAS OBTAINED including neurology, dermatology, psychiatry, cardiac, respiratory, lymph, extremities, GI, GU, Musculoskeletal, constitutional, breasts, reproductive, HEENT.  All pertinent positives are noted in the HPI.  All others are negative.   PHYSICAL EXAMINATION: ECOG PERFORMANCE STATUS: 2 - Symptomatic, <50% confined to bed  Vital signs reviewed GENERAL:alert, in no acute distress and comfortable SKIN: no acute rashes, no significant lesions EYES: conjunctiva are pink and non-injected, sclera anicteric OROPHARYNX: MMM, no exudates, no oropharyngeal erythema or ulceration NECK: supple, no JVD LYMPH:  no palpable lymphadenopathy in the cervical, axillary or inguinal regions LUNGS: clear to auscultation b/l with normal respiratory effort HEART: regular rate & rhythm ABDOMEN:  normoactive bowel sounds , non tender, not distended. No palpable hepatosplenomegaly.  Extremity: no pedal edema PSYCH: alert & oriented x 3 with fluent speech NEURO: no focal motor/sensory deficits  LABORATORY DATA:  I have reviewed the data as listed  . CBC Latest Ref Rng & Units 08/28/2019 07/27/2019 07/20/2019  WBC 4.0 - 10.5 K/uL 6.3 7.9 5.5  Hemoglobin 12.0 - 15.0 g/dL 12.3 12.3 13.0  Hematocrit 36.0 - 46.0 % 36.5 37.3 39.0  Platelets 150 - 400 K/uL 174 151 202    . CBC    Component Value Date/Time   WBC 6.3 08/28/2019 1250   RBC 3.92 08/28/2019 1250   HGB 12.3 08/28/2019 1250   HGB 12.8 02/16/2019 1138   HCT 36.5 08/28/2019 1250   PLT 174 08/28/2019 1250   PLT 170 02/16/2019 1138   MCV 93.1 08/28/2019 1250   MCH 31.4 08/28/2019 1250   MCHC 33.7 08/28/2019 1250   RDW 13.3 08/28/2019 1250   LYMPHSABS 1.0 08/28/2019 1250   MONOABS 1.1 (H) 08/28/2019 1250   EOSABS 0.3 08/28/2019 1250   BASOSABS 0.0 08/28/2019 1250    . CMP Latest Ref Rng & Units 08/28/2019 07/27/2019 07/20/2019  Glucose 70 - 99 mg/dL 206(H) 185(H) 228(H)  BUN 8 - 23 mg/dL _0 Creatinine 0.44 - 1.00 mg/dL 0.73 0.72 0.75  Sodium 135 - 145 mmol/L 139 138 140  Potassium 3.5 - 5.1 mmol/L 3.2(L) 3.4(L) 3.5  Chloride 98 - 111 mmol/L 102 105 106  CO2 22 - 32 mmol/L 26 22 20(L)  Calcium 8.9 - 10.3 mg/dL 9.4 9.4  9.8  Total Protein 6.5 - 8.1 g/dL 6.8 6.1(L) 6.6  Total Bilirubin 0.3 - 1.2 mg/dL 0.7 0.7 0.6  Alkaline Phos 38 - 126 U/L 80 73 73  AST 15 - 41 U/L _0 ALT 0 - 44 U/L _1 05/30/2019 BM Bx   01/06/2019 BM Bx:     01/06/19 Cytogenetics:      05/30/19 BM Biopsy:    RADIOGRAPHIC STUDIES: I have personally reviewed the radiological images as listed and agreed with the findings in the report. No results found.  ASSESSMENT & PLAN:   78 y.o. female with  1. Recently diagnosed Multiple Myeloma, RISS Stage III  Labs upon initial presentation from 12/08/18, blood counts are normal including WBC at 7.1k, HGB at 13.1, and PLT at 245k. Calcium normal at 10.3. Creatinine normal at 0.63. M spike at 0.5g. 12/13/18 Bone Scan revealed Multifocal uptake throughout the skeleton, consistent with diffuse metastatic disease. Primary tumor is not specified. 2. Uptake in the proximal right femur, consistent with lytic lesions. 3. Uptake in the ribs bilaterally as described. 4. Lesions in the proximal left humerus. 5. Diffuse uptake throughout the skull  consistent with metastatic disease. 6. Right paramedian uptake at the manubrium.  12/13/18 CT Right Femur revealed Numerous lytic lesions involving the right femur and a lytic lesion in the left inferior pubic ramus. Overall appearance is most concerning for multiple myeloma  12/27/18 Pretreatment 24hour UPEP observed an M spike at 48m, and showed 1936mtotal protein/day.  12/27/18 Pretreatment MMP revealed M Protein at 0.5g with IgG Lambda specificity. Kappa:Lambda light chain ratio at 0.13, with Lambda at 40.3. There is less abnormal protein and light chains than I would expect from 30% plasma cells, which suggests hypo-secretory or non-secretory neoplastic plasma cells. Will have an impact in assessing response. 01/05/19 PET/CT revealed Innumerable lytic lesions in the skeleton compatible with myeloma. Most of the larger lesions are hypermetabolic, for example including a left proximal humeral shaft lesion with maximum SUV of 8.1 and a 2.8 cm lesion in the left T9 vertebral body with maximum SUV 5.1. Most of the smaller lytic lesions, and some of the larger lesions, do not demonstrate accentuated metabolic activity. 2. 1.2 cm in short axis lymph node in the left parapharyngeal space is hypermetabolic with maximum SUV 11.8. I do not see a separate mass in the head and neck to give rise to this hypermetabolic lymph node. 3. Mosaic attenuation in the lower lobes, nonspecific possibly from air trapping. 4.  Aortic Atherosclerosis 5. Heterogeneous activity in the liver, making it hard to exclude small liver lesions. Consider hepatic protocol MRI with and without contrast for definitive assessment. Nonobstructive right nephrolithiasis. Old granulomatous disease  01/06/19 Bone Marrow biopsy revealed interstitial increase in plasma cells (28% aspirate, 40% CD138 immunohistochemistry). Plasma cells negative for light chains consistent with a non or weakly secretory myeloma   01/06/19 Cytogenetics revealed 37% of  cells with trisomy 11 or 11q deletion, and 40.5% of cells with 17p mutation  S/p 5 cycles of KRD treatment  05/31/19 BM Biopsy revealed mild atypical plasmacytosis at 5% with polytypic variation.   06/01/19 PET/CT revealed "Dominant lesion in the LEFT humerus is decreased significantly in metabolic activity. Additional hypermetabolic skeletal lytic lesions have decreased in metabolic activity or similar to comparison exam (01/05/2019). No evidence of disease progression. 2. Multiple additional lytic lesions do not have metabolic activity and unchanged. 3. No new skeletal lesions are identified. No  soft tissue plasmacytoma identified. 4. Nodule / node in the LEFT parapharyngeal space which is intensely hypermetabolic not changed from prior. 5. New hypermetabolic LEFT lower lobe pulmonary nodule is indeterminate. Recommend close attention on follow-up 6. New obstructive hydronephrosis of the RIGHT kidney related to RIGHT UPJ stone."  2. 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  01/17/19 MRI Liver revealed Several appreciable liver lesions all have benign imaging characteristics. No MRI findings of metastatic involvement of the liver. 2. Scattered bony lesions corresponding to the lytic lesions seen at PET-CT, compatible with active myeloma. 3. Aortic Atherosclerosis.  Mild cardiomegaly. 4. Diffuse hepatic steatosis.   3. Left lower lobe pulmonary nodule First seen on 06/01/19 PET/CT  PLAN:  -Discussed pt labwork today, 08/28/19; all values are WNL except for Monocytes Abs at 1.1, Potassium at 3.2, Glucose at 206, Albumin at 3.1. - Discussed 07/20/2019 MMP is as follows: IgG at 761, IgA at 369, IgM at 22, Total Protein at 6.0, Albumin at 3.2, Alpha 1 at 0.2, Alpha2 at 0.9, B-Globulin at 1.0, Gamma Glob at 0.7, M Protein is "Not Observed", Total Globulin at 2.8, Albumin/Glob at 1.2, IFE revealed " The  immunofixation pattern appears unremarkable. Evidence of monoclonal protein is not apparent." -Will plan to watch new pulmonary nodule in the next 3 months with repeat imaging -The pt has no prohibitive toxicities from continuing C8D1 Carfilzomib, Revlimid, and Dexamethasone at this time. -Continue 40m Claritin or Zyrtec and 245mPepcid for mild rashes, will continue to watch these -Adjusted premedications to add Tylenol and will split dose of Dexamethasone with pre-meds and pt will not take Decadron at home except on D22 -Continue Fentanyl patch and 1065mxycodone for break through pain -Continue Zometa every 4 weeks -Continue Acyclovir and 22m49mpirin to mitigate risk of Shingles and blood clots respectively -[place]ake[place]ult recommended continuing current treatment.  -Advised pt again on the agressiveness typical of Multiple Myeloma with 17p mutation  -Pt is not interested in transplant at this time.  -Will repeat PET/CT scan & BM Bx in 3 weeks -Depending on the outcome of the upcoming PET/CT scan and BM Bx  we would consider lowering the dose of Rituxan and dropping the 8th day of Carfilzomib for maintainence, depending on pt's toleration of the treatment. -Discussed the importance of the pt's mental health and quality of life. -Discussed pt's symptoms being caused by medication or stress/anxiety. -Will discontinue steroids -Rx Fentanyl   FOLLOW UP: PET/CT in 3 weeks CT bone marrow biopsy in 3 weeks Please schedule next cycle of Carfilzomib as ordered to start in 4 weeks with labs and MD visit  The total time spent in the appt was 30 minutes and more than 50% was on counseling and direct patient cares.  All of the patient's questions were answered with apparent satisfaction. The patient knows to call the clinic with any problems, questions or concerns.    GautSullivan LoneMS AHoodsportIVMS SCH [facility] Hospitalatology/Oncology Physician [facility] Center[facility]ice):        [phone] cell):  [phone]):           [phone] 1:27 PM  I, JazzYevette Edwards acting as a scribe for Dr. GautSullivan Lone.I have reviewed the above documentation for accuracy and completeness, and I agree with the above. .GauBrunetta Genera

## 2019-08-28 ENCOUNTER — Ambulatory Visit: Payer: Medicare Other | Admitting: Hematology

## 2019-08-28 ENCOUNTER — Other Ambulatory Visit: Payer: Medicare Other

## 2019-08-28 ENCOUNTER — Other Ambulatory Visit: Payer: Self-pay

## 2019-08-28 ENCOUNTER — Telehealth: Payer: Self-pay | Admitting: Hematology

## 2019-08-28 ENCOUNTER — Inpatient Hospital Stay: Payer: Medicare Other

## 2019-08-28 ENCOUNTER — Ambulatory Visit: Payer: Medicare Other

## 2019-08-28 ENCOUNTER — Inpatient Hospital Stay: Payer: Medicare Other | Attending: Hematology

## 2019-08-28 ENCOUNTER — Inpatient Hospital Stay (HOSPITAL_BASED_OUTPATIENT_CLINIC_OR_DEPARTMENT_OTHER): Payer: Medicare Other | Admitting: Hematology

## 2019-08-28 ENCOUNTER — Other Ambulatory Visit: Payer: Self-pay | Admitting: *Deleted

## 2019-08-28 VITALS — BP 133/69 | HR 87 | Temp 98.3°F | Resp 18

## 2019-08-28 DIAGNOSIS — C9000 Multiple myeloma not having achieved remission: Secondary | ICD-10-CM

## 2019-08-28 DIAGNOSIS — Z95828 Presence of other vascular implants and grafts: Secondary | ICD-10-CM

## 2019-08-28 DIAGNOSIS — Z7189 Other specified counseling: Secondary | ICD-10-CM

## 2019-08-28 DIAGNOSIS — Z5112 Encounter for antineoplastic immunotherapy: Secondary | ICD-10-CM | POA: Insufficient documentation

## 2019-08-28 DIAGNOSIS — Z23 Encounter for immunization: Secondary | ICD-10-CM | POA: Diagnosis not present

## 2019-08-28 LAB — CBC WITH DIFFERENTIAL/PLATELET
Abs Immature Granulocytes: 0.02 10*3/uL (ref 0.00–0.07)
Basophils Absolute: 0 10*3/uL (ref 0.0–0.1)
Basophils Relative: 1 %
Eosinophils Absolute: 0.3 10*3/uL (ref 0.0–0.5)
Eosinophils Relative: 4 %
HCT: 36.5 % (ref 36.0–46.0)
Hemoglobin: 12.3 g/dL (ref 12.0–15.0)
Immature Granulocytes: 0 %
Lymphocytes Relative: 16 %
Lymphs Abs: 1 10*3/uL (ref 0.7–4.0)
MCH: 31.4 pg (ref 26.0–34.0)
MCHC: 33.7 g/dL (ref 30.0–36.0)
MCV: 93.1 fL (ref 80.0–100.0)
Monocytes Absolute: 1.1 10*3/uL — ABNORMAL HIGH (ref 0.1–1.0)
Monocytes Relative: 18 %
Neutro Abs: 3.9 10*3/uL (ref 1.7–7.7)
Neutrophils Relative %: 61 %
Platelets: 174 10*3/uL (ref 150–400)
RBC: 3.92 MIL/uL (ref 3.87–5.11)
RDW: 13.3 % (ref 11.5–15.5)
WBC: 6.3 10*3/uL (ref 4.0–10.5)
nRBC: 0 % (ref 0.0–0.2)

## 2019-08-28 LAB — CMP (CANCER CENTER ONLY)
ALT: 21 U/L (ref 0–44)
AST: 20 U/L (ref 15–41)
Albumin: 3.1 g/dL — ABNORMAL LOW (ref 3.5–5.0)
Alkaline Phosphatase: 80 U/L (ref 38–126)
Anion gap: 11 (ref 5–15)
BUN: 9 mg/dL (ref 8–23)
CO2: 26 mmol/L (ref 22–32)
Calcium: 9.4 mg/dL (ref 8.9–10.3)
Chloride: 102 mmol/L (ref 98–111)
Creatinine: 0.73 mg/dL (ref 0.44–1.00)
GFR, Est AFR Am: >60 mL/min (ref >60)
GFR, Estimated: >60 mL/min (ref >60)
Glucose, Bld: 206 mg/dL — ABNORMAL HIGH (ref 70–99)
Potassium: 3.2 mmol/L — ABNORMAL LOW (ref 3.5–5.1)
Sodium: 139 mmol/L (ref 135–145)
Total Bilirubin: 0.7 mg/dL (ref 0.3–1.2)
Total Protein: 6.8 g/dL (ref 6.5–8.1)

## 2019-08-28 MED ORDER — FAMOTIDINE 20 MG PO TABS
ORAL_TABLET | ORAL | Status: AC
  Filled 2019-08-28: qty 1

## 2019-08-28 MED ORDER — ACETAMINOPHEN 325 MG PO TABS
650.0000 mg | ORAL_TABLET | Freq: Once | ORAL | Status: AC
  Administered 2019-08-28: 650 mg via ORAL

## 2019-08-28 MED ORDER — ACETAMINOPHEN 325 MG PO TABS
ORAL_TABLET | ORAL | Status: AC
  Filled 2019-08-28: qty 2

## 2019-08-28 MED ORDER — DEXTROSE 5 % IV SOLN
27.0000 mg/m2 | Freq: Once | INTRAVENOUS | Status: AC
  Administered 2019-08-28: 50 mg via INTRAVENOUS
  Filled 2019-08-28: qty 15

## 2019-08-28 MED ORDER — HEPARIN SOD (PORK) LOCK FLUSH 100 UNIT/ML IV SOLN
500.0000 [IU] | Freq: Once | INTRAVENOUS | Status: AC | PRN
  Administered 2019-08-28: 500 [IU]
  Filled 2019-08-28: qty 5

## 2019-08-28 MED ORDER — SODIUM CHLORIDE 0.9 % IV SOLN
Freq: Once | INTRAVENOUS | Status: DC
  Filled 2019-08-28: qty 250

## 2019-08-28 MED ORDER — DEXAMETHASONE 4 MG PO TABS
20.0000 mg | ORAL_TABLET | Freq: Once | ORAL | Status: AC
  Administered 2019-08-28: 20 mg via ORAL

## 2019-08-28 MED ORDER — DIPHENHYDRAMINE HCL 25 MG PO CAPS
ORAL_CAPSULE | ORAL | Status: AC
  Filled 2019-08-28: qty 1

## 2019-08-28 MED ORDER — PROCHLORPERAZINE MALEATE 10 MG PO TABS
10.0000 mg | ORAL_TABLET | Freq: Once | ORAL | Status: AC
  Administered 2019-08-28: 14:00:00 10 mg via ORAL

## 2019-08-28 MED ORDER — DEXAMETHASONE 4 MG PO TABS
ORAL_TABLET | ORAL | Status: AC
  Filled 2019-08-28: qty 5

## 2019-08-28 MED ORDER — SODIUM CHLORIDE 0.9% FLUSH
10.0000 mL | INTRAVENOUS | Status: DC | PRN
  Administered 2019-08-28: 10 mL
  Filled 2019-08-28: qty 10

## 2019-08-28 MED ORDER — PROCHLORPERAZINE MALEATE 10 MG PO TABS
ORAL_TABLET | ORAL | Status: AC
  Filled 2019-08-28: qty 1

## 2019-08-28 MED ORDER — DIPHENHYDRAMINE HCL 25 MG PO CAPS
25.0000 mg | ORAL_CAPSULE | Freq: Once | ORAL | Status: AC
  Administered 2019-08-28: 25 mg via ORAL

## 2019-08-28 MED ORDER — ZOLEDRONIC ACID 4 MG/100ML IV SOLN
4.0000 mg | Freq: Once | INTRAVENOUS | Status: AC
  Administered 2019-08-28: 4 mg via INTRAVENOUS
  Filled 2019-08-28: qty 100

## 2019-08-28 MED ORDER — FAMOTIDINE 20 MG PO TABS
20.0000 mg | ORAL_TABLET | Freq: Once | ORAL | Status: AC
  Administered 2019-08-28: 20 mg via ORAL

## 2019-08-28 MED ORDER — SODIUM CHLORIDE 0.9 % IV SOLN
Freq: Once | INTRAVENOUS | Status: AC
  Administered 2019-08-28: 14:00:00 via INTRAVENOUS
  Filled 2019-08-28: qty 250

## 2019-08-28 NOTE — Telephone Encounter (Signed)
Revlimid refilled in another encounter

## 2019-08-28 NOTE — Telephone Encounter (Signed)
Scheduled appt per 9/14 los. °

## 2019-08-28 NOTE — Telephone Encounter (Signed)
Per Dr. Irene Limbo, refill Revlimid at current dose 08/28/2019 Refill escribed to [facility] ([place], [place] # [id number], 08/28/2019

## 2019-08-28 NOTE — Patient Instructions (Signed)
[facility] Discharge Instructions for Patients Receiving Chemotherapy  Today you received the following chemotherapy agents:Kyprolis and Zometa  To help prevent nausea and vomiting after your treatment, we encourage you to take your nausea medication as directed.    If you develop nausea and vomiting that is not controlled by your nausea medication, call the clinic.   BELOW ARE SYMPTOMS THAT SHOULD BE REPORTED IMMEDIATELY:  *FEVER GREATER THAN 100.5 F  *CHILLS WITH OR WITHOUT FEVER  NAUSEA AND VOMITING THAT IS NOT CONTROLLED WITH YOUR NAUSEA MEDICATION  *UNUSUAL SHORTNESS OF BREATH  *UNUSUAL BRUISING OR BLEEDING  TENDERNESS IN MOUTH AND THROAT WITH OR WITHOUT PRESENCE OF ULCERS  *URINARY PROBLEMS  *BOWEL PROBLEMS  UNUSUAL RASH Items with * indicate a potential emergency and should be followed up as soon as possible.  Feel free to call the clinic should you have any questions or concerns. The clinic phone number is [phone].  Please show the CHEMO ALERT CARD at check-in to the Emergency Department and triage nurse.   

## 2019-08-29 ENCOUNTER — Inpatient Hospital Stay: Payer: Medicare Other

## 2019-08-29 ENCOUNTER — Other Ambulatory Visit: Payer: Self-pay | Admitting: Oncology

## 2019-08-29 ENCOUNTER — Other Ambulatory Visit: Payer: Self-pay

## 2019-08-29 VITALS — BP 133/71 | HR 74 | Temp 98.2°F | Resp 18

## 2019-08-29 DIAGNOSIS — C9000 Multiple myeloma not having achieved remission: Secondary | ICD-10-CM

## 2019-08-29 DIAGNOSIS — Z7189 Other specified counseling: Secondary | ICD-10-CM

## 2019-08-29 DIAGNOSIS — Z5112 Encounter for antineoplastic immunotherapy: Secondary | ICD-10-CM | POA: Diagnosis not present

## 2019-08-29 MED ORDER — DEXAMETHASONE 4 MG PO TABS
ORAL_TABLET | ORAL | Status: AC
  Filled 2019-08-29: qty 5

## 2019-08-29 MED ORDER — FAMOTIDINE IN NACL 20-0.9 MG/50ML-% IV SOLN
INTRAVENOUS | Status: AC
  Filled 2019-08-29: qty 50

## 2019-08-29 MED ORDER — PROCHLORPERAZINE MALEATE 10 MG PO TABS
ORAL_TABLET | ORAL | Status: AC
  Filled 2019-08-29: qty 1

## 2019-08-29 MED ORDER — FAMOTIDINE 20 MG PO TABS
ORAL_TABLET | ORAL | Status: AC
  Filled 2019-08-29: qty 1

## 2019-08-29 MED ORDER — ACETAMINOPHEN 325 MG PO TABS
ORAL_TABLET | ORAL | Status: AC
  Filled 2019-08-29: qty 2

## 2019-08-29 MED ORDER — DIPHENHYDRAMINE HCL 25 MG PO CAPS
ORAL_CAPSULE | ORAL | Status: AC
  Filled 2019-08-29: qty 1

## 2019-08-29 MED ORDER — SODIUM CHLORIDE 0.9 % IV SOLN
Freq: Once | INTRAVENOUS | Status: AC
  Administered 2019-08-29: 15:00:00 via INTRAVENOUS
  Filled 2019-08-29: qty 250

## 2019-08-29 MED ORDER — PROCHLORPERAZINE MALEATE 10 MG PO TABS
10.0000 mg | ORAL_TABLET | Freq: Once | ORAL | Status: AC
  Administered 2019-08-29: 15:00:00 10 mg via ORAL

## 2019-08-29 MED ORDER — DIPHENHYDRAMINE HCL 25 MG PO CAPS
25.0000 mg | ORAL_CAPSULE | Freq: Once | ORAL | Status: AC
  Administered 2019-08-29: 25 mg via ORAL

## 2019-08-29 MED ORDER — FAMOTIDINE 20 MG PO TABS
20.0000 mg | ORAL_TABLET | Freq: Once | ORAL | Status: AC
  Administered 2019-08-29: 20 mg via ORAL

## 2019-08-29 MED ORDER — SODIUM CHLORIDE 0.9% FLUSH
10.0000 mL | INTRAVENOUS | Status: DC | PRN
  Administered 2019-08-29: 10 mL
  Filled 2019-08-29: qty 10

## 2019-08-29 MED ORDER — DEXTROSE 5 % IV SOLN
27.0000 mg/m2 | Freq: Once | INTRAVENOUS | Status: AC
  Administered 2019-08-29: 16:00:00 50 mg via INTRAVENOUS
  Filled 2019-08-29: qty 10

## 2019-08-29 MED ORDER — HEPARIN SOD (PORK) LOCK FLUSH 100 UNIT/ML IV SOLN
500.0000 [IU] | Freq: Once | INTRAVENOUS | Status: AC | PRN
  Administered 2019-08-29: 16:00:00 500 [IU]
  Filled 2019-08-29: qty 5

## 2019-08-29 MED ORDER — DEXAMETHASONE 4 MG PO TABS
20.0000 mg | ORAL_TABLET | Freq: Once | ORAL | Status: AC
  Administered 2019-08-29: 15:00:00 20 mg via ORAL

## 2019-08-29 MED ORDER — ACETAMINOPHEN 325 MG PO TABS
650.0000 mg | ORAL_TABLET | Freq: Once | ORAL | Status: AC
  Administered 2019-08-29: 15:00:00 650 mg via ORAL

## 2019-08-29 NOTE — Patient Instructions (Signed)
[facility]charge [facility] for Patients Receiving Chemotherapy  Today you received the following chemotherapy agents:  Kyprolis  To help prevent nausea and vomiting after your treatment, we encourage you to take your nausea medication as directed.   If you develop nausea and vomiting that is not controlled by your nausea medication, call the clinic.   BELOW ARE SYMPTOMS THAT SHOULD BE REPORTED IMMEDIATELY:  *FEVER GREATER THAN 100.5 F  *CHILLS WITH OR WITHOUT FEVER  NAUSEA AND VOMITING THAT IS NOT CONTROLLED WITH YOUR NAUSEA MEDICATION  *UNUSUAL SHORTNESS OF BREATH  *UNUSUAL BRUISING OR BLEEDING  TENDERNESS IN MOUTH AND THROAT WITH OR WITHOUT PRESENCE OF ULCERS  *URINARY PROBLEMS  *BOWEL PROBLEMS  UNUSUAL RASH Items with * indicate a potential emergency and should be followed up as soon as possible.  Feel free to call the clinic should you have any questions or concerns. The clinic phone number is [phone].  Please show the CHEMO ALERT CARD at check-in to the Emergency Department and triage nurse.   

## 2019-08-31 ENCOUNTER — Other Ambulatory Visit: Payer: Medicare Other

## 2019-08-31 ENCOUNTER — Ambulatory Visit: Payer: Medicare Other

## 2019-09-01 ENCOUNTER — Ambulatory Visit: Payer: Medicare Other

## 2019-09-03 ENCOUNTER — Encounter: Payer: Self-pay | Admitting: Internal Medicine

## 2019-09-03 ENCOUNTER — Encounter: Payer: Self-pay | Admitting: Hematology

## 2019-09-04 ENCOUNTER — Other Ambulatory Visit: Payer: Self-pay | Admitting: *Deleted

## 2019-09-04 ENCOUNTER — Other Ambulatory Visit: Payer: Self-pay

## 2019-09-04 ENCOUNTER — Inpatient Hospital Stay: Payer: Medicare Other

## 2019-09-04 ENCOUNTER — Encounter: Payer: Self-pay | Admitting: Hematology

## 2019-09-04 ENCOUNTER — Encounter: Payer: Self-pay | Admitting: Internal Medicine

## 2019-09-04 VITALS — BP 130/65 | HR 72 | Temp 98.0°F | Resp 18

## 2019-09-04 DIAGNOSIS — Z7189 Other specified counseling: Secondary | ICD-10-CM

## 2019-09-04 DIAGNOSIS — C9000 Multiple myeloma not having achieved remission: Secondary | ICD-10-CM

## 2019-09-04 DIAGNOSIS — Z5112 Encounter for antineoplastic immunotherapy: Secondary | ICD-10-CM | POA: Diagnosis not present

## 2019-09-04 DIAGNOSIS — Z95828 Presence of other vascular implants and grafts: Secondary | ICD-10-CM

## 2019-09-04 LAB — CMP (CANCER CENTER ONLY)
ALT: 16 U/L (ref 0–44)
AST: 13 U/L — ABNORMAL LOW (ref 15–41)
Albumin: 3.1 g/dL — ABNORMAL LOW (ref 3.5–5.0)
Alkaline Phosphatase: 87 U/L (ref 38–126)
Anion gap: 7 (ref 5–15)
BUN: 13 mg/dL (ref 8–23)
CO2: 24 mmol/L (ref 22–32)
Calcium: 9.6 mg/dL (ref 8.9–10.3)
Chloride: 105 mmol/L (ref 98–111)
Creatinine: 0.75 mg/dL (ref 0.44–1.00)
GFR, Est AFR Am: >60 mL/min (ref >60)
GFR, Estimated: >60 mL/min (ref >60)
Glucose, Bld: 203 mg/dL — ABNORMAL HIGH (ref 70–99)
Potassium: 4.1 mmol/L (ref 3.5–5.1)
Sodium: 136 mmol/L (ref 135–145)
Total Bilirubin: 0.5 mg/dL (ref 0.3–1.2)
Total Protein: 6.5 g/dL (ref 6.5–8.1)

## 2019-09-04 LAB — CBC WITH DIFFERENTIAL/PLATELET
Abs Immature Granulocytes: 0.04 10*3/uL (ref 0.00–0.07)
Basophils Absolute: 0 10*3/uL (ref 0.0–0.1)
Basophils Relative: 1 %
Eosinophils Absolute: 0.2 10*3/uL (ref 0.0–0.5)
Eosinophils Relative: 3 %
HCT: 38.6 % (ref 36.0–46.0)
Hemoglobin: 12.9 g/dL (ref 12.0–15.0)
Immature Granulocytes: 1 %
Lymphocytes Relative: 23 %
Lymphs Abs: 1.6 10*3/uL (ref 0.7–4.0)
MCH: 31.2 pg (ref 26.0–34.0)
MCHC: 33.4 g/dL (ref 30.0–36.0)
MCV: 93.5 fL (ref 80.0–100.0)
Monocytes Absolute: 1.4 10*3/uL — ABNORMAL HIGH (ref 0.1–1.0)
Monocytes Relative: 19 %
Neutro Abs: 3.8 10*3/uL (ref 1.7–7.7)
Neutrophils Relative %: 53 %
Platelets: 210 10*3/uL (ref 150–400)
RBC: 4.13 MIL/uL (ref 3.87–5.11)
RDW: 13.4 % (ref 11.5–15.5)
WBC: 7.1 10*3/uL (ref 4.0–10.5)
nRBC: 0 % (ref 0.0–0.2)

## 2019-09-04 MED ORDER — FAMOTIDINE 20 MG PO TABS
20.0000 mg | ORAL_TABLET | Freq: Once | ORAL | Status: AC
  Administered 2019-09-04: 14:00:00 20 mg via ORAL

## 2019-09-04 MED ORDER — SODIUM CHLORIDE 0.9 % IV SOLN
Freq: Once | INTRAVENOUS | Status: AC
  Administered 2019-09-04: 14:00:00 via INTRAVENOUS
  Filled 2019-09-04: qty 250

## 2019-09-04 MED ORDER — DEXTROSE 5 % IV SOLN
27.0000 mg/m2 | Freq: Once | INTRAVENOUS | Status: AC
  Administered 2019-09-04: 50 mg via INTRAVENOUS
  Filled 2019-09-04: qty 10

## 2019-09-04 MED ORDER — DEXAMETHASONE 4 MG PO TABS
ORAL_TABLET | ORAL | Status: AC
  Filled 2019-09-04: qty 5

## 2019-09-04 MED ORDER — DIPHENHYDRAMINE HCL 25 MG PO CAPS
ORAL_CAPSULE | ORAL | Status: AC
  Filled 2019-09-04: qty 1

## 2019-09-04 MED ORDER — HEPARIN SOD (PORK) LOCK FLUSH 100 UNIT/ML IV SOLN
500.0000 [IU] | Freq: Once | INTRAVENOUS | Status: AC | PRN
  Administered 2019-09-04: 15:00:00 500 [IU]
  Filled 2019-09-04: qty 5

## 2019-09-04 MED ORDER — SODIUM CHLORIDE 0.9% FLUSH
10.0000 mL | Freq: Once | INTRAVENOUS | Status: AC
  Administered 2019-09-04: 12:00:00 10 mL
  Filled 2019-09-04: qty 10

## 2019-09-04 MED ORDER — SODIUM CHLORIDE 0.9 % IV SOLN
Freq: Once | INTRAVENOUS | Status: AC
  Administered 2019-09-04: 13:00:00 via INTRAVENOUS
  Filled 2019-09-04: qty 250

## 2019-09-04 MED ORDER — DEXAMETHASONE 4 MG PO TABS
20.0000 mg | ORAL_TABLET | Freq: Once | ORAL | Status: AC
  Administered 2019-09-04: 20 mg via ORAL

## 2019-09-04 MED ORDER — PROCHLORPERAZINE MALEATE 10 MG PO TABS
10.0000 mg | ORAL_TABLET | Freq: Once | ORAL | Status: AC
  Administered 2019-09-04: 10 mg via ORAL

## 2019-09-04 MED ORDER — OXYCODONE HCL 10 MG PO TABS: 10.0000 mg | ORAL_TABLET | Freq: Four times a day (QID) | ORAL | 0 refills | Status: DC | PRN

## 2019-09-04 MED ORDER — FAMOTIDINE 20 MG PO TABS
ORAL_TABLET | ORAL | Status: AC
  Filled 2019-09-04: qty 1

## 2019-09-04 MED ORDER — DIPHENHYDRAMINE HCL 25 MG PO TABS
25.0000 mg | ORAL_TABLET | Freq: Once | ORAL | Status: AC
  Administered 2019-09-04: 14:00:00 25 mg via ORAL
  Filled 2019-09-04: qty 1

## 2019-09-04 MED ORDER — ACETAMINOPHEN 325 MG PO TABS
650.0000 mg | ORAL_TABLET | Freq: Once | ORAL | Status: AC
  Administered 2019-09-04: 14:00:00 650 mg via ORAL

## 2019-09-04 MED ORDER — ACETAMINOPHEN 325 MG PO TABS
ORAL_TABLET | ORAL | Status: AC
  Filled 2019-09-04: qty 2

## 2019-09-04 MED ORDER — GLIPIZIDE ER 5 MG PO TB24: ORAL_TABLET | ORAL | 1 refills | Status: DC

## 2019-09-04 MED ORDER — SODIUM CHLORIDE 0.9% FLUSH
10.0000 mL | INTRAVENOUS | Status: DC | PRN
  Administered 2019-09-04: 15:00:00 10 mL
  Filled 2019-09-04: qty 10

## 2019-09-04 MED ORDER — FAMOTIDINE IN NACL 20-0.9 MG/50ML-% IV SOLN
INTRAVENOUS | Status: AC
  Filled 2019-09-04: qty 50

## 2019-09-04 MED ORDER — PROCHLORPERAZINE MALEATE 10 MG PO TABS
ORAL_TABLET | ORAL | Status: AC
  Filled 2019-09-04: qty 1

## 2019-09-04 NOTE — Patient Instructions (Signed)

## 2019-09-04 NOTE — Telephone Encounter (Signed)
Patient requests refill of Oxycodone

## 2019-09-04 NOTE — Patient Instructions (Signed)
[facility] Discharge Instructions for Patients Receiving Chemotherapy  Today you received the following chemotherapy agents:  Kyprolis  To help prevent nausea and vomiting after your treatment, we encourage you to take your nausea medication as directed.   If you develop nausea and vomiting that is not controlled by your nausea medication, call the clinic.   BELOW ARE SYMPTOMS THAT SHOULD BE REPORTED IMMEDIATELY:  *FEVER GREATER THAN 100.5 F  *CHILLS WITH OR WITHOUT FEVER  NAUSEA AND VOMITING THAT IS NOT CONTROLLED WITH YOUR NAUSEA MEDICATION  *UNUSUAL SHORTNESS OF BREATH  *UNUSUAL BRUISING OR BLEEDING  TENDERNESS IN MOUTH AND THROAT WITH OR WITHOUT PRESENCE OF ULCERS  *URINARY PROBLEMS  *BOWEL PROBLEMS  UNUSUAL RASH Items with * indicate a potential emergency and should be followed up as soon as possible.  Feel free to call the clinic should you have any questions or concerns. The clinic phone number is [phone].  Please show the CHEMO ALERT CARD at check-in to the Emergency Department and triage nurse.   

## 2019-09-05 ENCOUNTER — Inpatient Hospital Stay: Payer: Medicare Other

## 2019-09-05 ENCOUNTER — Other Ambulatory Visit: Payer: Self-pay

## 2019-09-05 VITALS — BP 131/73 | HR 85 | Temp 98.5°F | Resp 16

## 2019-09-05 DIAGNOSIS — Z7189 Other specified counseling: Secondary | ICD-10-CM

## 2019-09-05 DIAGNOSIS — Z5112 Encounter for antineoplastic immunotherapy: Secondary | ICD-10-CM | POA: Diagnosis not present

## 2019-09-05 DIAGNOSIS — C9000 Multiple myeloma not having achieved remission: Secondary | ICD-10-CM

## 2019-09-05 MED ORDER — FAMOTIDINE 20 MG PO TABS
ORAL_TABLET | ORAL | Status: AC
  Filled 2019-09-05: qty 1

## 2019-09-05 MED ORDER — SODIUM CHLORIDE 0.9 % IV SOLN
Freq: Once | INTRAVENOUS | Status: AC
  Administered 2019-09-05: 14:00:00 via INTRAVENOUS
  Filled 2019-09-05: qty 250

## 2019-09-05 MED ORDER — DEXAMETHASONE 4 MG PO TABS
ORAL_TABLET | ORAL | Status: AC
  Filled 2019-09-05: qty 5

## 2019-09-05 MED ORDER — SODIUM CHLORIDE 0.9 % IV SOLN
Freq: Once | INTRAVENOUS | Status: AC
  Administered 2019-09-05: 15:00:00 via INTRAVENOUS
  Filled 2019-09-05: qty 250

## 2019-09-05 MED ORDER — DEXAMETHASONE 4 MG PO TABS
20.0000 mg | ORAL_TABLET | Freq: Once | ORAL | Status: AC
  Administered 2019-09-05: 20 mg via ORAL

## 2019-09-05 MED ORDER — DIPHENHYDRAMINE HCL 25 MG PO CAPS
25.0000 mg | ORAL_CAPSULE | Freq: Once | ORAL | Status: AC
  Administered 2019-09-05: 15:00:00 25 mg via ORAL

## 2019-09-05 MED ORDER — FAMOTIDINE 20 MG PO TABS
20.0000 mg | ORAL_TABLET | Freq: Once | ORAL | Status: AC
  Administered 2019-09-05: 20 mg via ORAL

## 2019-09-05 MED ORDER — DEXTROSE 5 % IV SOLN
27.0000 mg/m2 | Freq: Once | INTRAVENOUS | Status: AC
  Administered 2019-09-05: 15:00:00 50 mg via INTRAVENOUS
  Filled 2019-09-05: qty 10

## 2019-09-05 MED ORDER — DIPHENHYDRAMINE HCL 25 MG PO CAPS
ORAL_CAPSULE | ORAL | Status: AC
  Filled 2019-09-05: qty 1

## 2019-09-05 MED ORDER — PROCHLORPERAZINE MALEATE 10 MG PO TABS
ORAL_TABLET | ORAL | Status: AC
  Filled 2019-09-05: qty 1

## 2019-09-05 MED ORDER — ACETAMINOPHEN 325 MG PO TABS
650.0000 mg | ORAL_TABLET | Freq: Once | ORAL | Status: AC
  Administered 2019-09-05: 650 mg via ORAL

## 2019-09-05 MED ORDER — HEPARIN SOD (PORK) LOCK FLUSH 100 UNIT/ML IV SOLN
500.0000 [IU] | Freq: Once | INTRAVENOUS | Status: DC | PRN
  Filled 2019-09-05: qty 5

## 2019-09-05 MED ORDER — SODIUM CHLORIDE 0.9% FLUSH
10.0000 mL | INTRAVENOUS | Status: DC | PRN
  Filled 2019-09-05: qty 10

## 2019-09-05 MED ORDER — PROCHLORPERAZINE MALEATE 10 MG PO TABS
10.0000 mg | ORAL_TABLET | Freq: Once | ORAL | Status: AC
  Administered 2019-09-05: 10 mg via ORAL

## 2019-09-05 MED ORDER — ACETAMINOPHEN 325 MG PO TABS
ORAL_TABLET | ORAL | Status: AC
  Filled 2019-09-05: qty 2

## 2019-09-05 NOTE — Patient Instructions (Signed)
[facility] Discharge Instructions for Patients Receiving Chemotherapy  Today you received the following chemotherapy agents:  carfilzomib  To help prevent nausea and vomiting after your treatment, we encourage you to take your nausea medication as prescribed.   If you develop nausea and vomiting that is not controlled by your nausea medication, call the clinic.   BELOW ARE SYMPTOMS THAT SHOULD BE REPORTED IMMEDIATELY:  *FEVER GREATER THAN 100.5 F  *CHILLS WITH OR WITHOUT FEVER  NAUSEA AND VOMITING THAT IS NOT CONTROLLED WITH YOUR NAUSEA MEDICATION  *UNUSUAL SHORTNESS OF BREATH  *UNUSUAL BRUISING OR BLEEDING  TENDERNESS IN MOUTH AND THROAT WITH OR WITHOUT PRESENCE OF ULCERS  *URINARY PROBLEMS  *BOWEL PROBLEMS  UNUSUAL RASH Items with * indicate a potential emergency and should be followed up as soon as possible.  Feel free to call the clinic should you have any questions or concerns. The clinic phone number is [phone].  Please show the [place] at check-in to the Emergency Department and triage nurse.

## 2019-09-08 ENCOUNTER — Encounter: Payer: Self-pay | Admitting: Hematology

## 2019-09-11 ENCOUNTER — Inpatient Hospital Stay: Payer: Medicare Other

## 2019-09-11 ENCOUNTER — Other Ambulatory Visit: Payer: Self-pay

## 2019-09-11 ENCOUNTER — Encounter: Payer: Self-pay | Admitting: Hematology

## 2019-09-11 ENCOUNTER — Other Ambulatory Visit: Payer: Self-pay | Admitting: Hematology

## 2019-09-11 VITALS — BP 143/76 | HR 105 | Temp 98.0°F | Resp 18

## 2019-09-11 DIAGNOSIS — Z23 Encounter for immunization: Secondary | ICD-10-CM

## 2019-09-11 DIAGNOSIS — Z5112 Encounter for antineoplastic immunotherapy: Secondary | ICD-10-CM | POA: Diagnosis not present

## 2019-09-11 DIAGNOSIS — Z7189 Other specified counseling: Secondary | ICD-10-CM

## 2019-09-11 DIAGNOSIS — C9000 Multiple myeloma not having achieved remission: Secondary | ICD-10-CM

## 2019-09-11 LAB — CMP (CANCER CENTER ONLY)
ALT: 12 U/L (ref 0–44)
AST: 10 U/L — ABNORMAL LOW (ref 15–41)
Albumin: 3.2 g/dL — ABNORMAL LOW (ref 3.5–5.0)
Alkaline Phosphatase: 86 U/L (ref 38–126)
Anion gap: 12 (ref 5–15)
BUN: 13 mg/dL (ref 8–23)
CO2: 22 mmol/L (ref 22–32)
Calcium: 9.7 mg/dL (ref 8.9–10.3)
Chloride: 100 mmol/L (ref 98–111)
Creatinine: 0.89 mg/dL (ref 0.44–1.00)
GFR, Est AFR Am: >60 mL/min (ref >60)
GFR, Estimated: >60 mL/min (ref >60)
Glucose, Bld: 290 mg/dL — ABNORMAL HIGH (ref 70–99)
Potassium: 3.5 mmol/L (ref 3.5–5.1)
Sodium: 134 mmol/L — ABNORMAL LOW (ref 135–145)
Total Bilirubin: 0.7 mg/dL (ref 0.3–1.2)
Total Protein: 6.4 g/dL — ABNORMAL LOW (ref 6.5–8.1)

## 2019-09-11 LAB — CBC WITH DIFFERENTIAL/PLATELET
Abs Immature Granulocytes: 0.15 10*3/uL — ABNORMAL HIGH (ref 0.00–0.07)
Basophils Absolute: 0 10*3/uL (ref 0.0–0.1)
Basophils Relative: 0 %
Eosinophils Absolute: 0.1 10*3/uL (ref 0.0–0.5)
Eosinophils Relative: 1 %
HCT: 40.3 % (ref 36.0–46.0)
Hemoglobin: 13.5 g/dL (ref 12.0–15.0)
Immature Granulocytes: 1 %
Lymphocytes Relative: 12 %
Lymphs Abs: 1.5 10*3/uL (ref 0.7–4.0)
MCH: 31.7 pg (ref 26.0–34.0)
MCHC: 33.5 g/dL (ref 30.0–36.0)
MCV: 94.6 fL (ref 80.0–100.0)
Monocytes Absolute: 1.4 10*3/uL — ABNORMAL HIGH (ref 0.1–1.0)
Monocytes Relative: 11 %
Neutro Abs: 9 10*3/uL — ABNORMAL HIGH (ref 1.7–7.7)
Neutrophils Relative %: 75 %
Platelets: 175 10*3/uL (ref 150–400)
RBC: 4.26 MIL/uL (ref 3.87–5.11)
RDW: 13.7 % (ref 11.5–15.5)
WBC: 12.3 10*3/uL — ABNORMAL HIGH (ref 4.0–10.5)
nRBC: 0 % (ref 0.0–0.2)

## 2019-09-11 MED ORDER — FAMOTIDINE 20 MG PO TABS
ORAL_TABLET | ORAL | Status: AC
  Filled 2019-09-11: qty 1

## 2019-09-11 MED ORDER — SODIUM CHLORIDE 0.9 % IV SOLN
Freq: Once | INTRAVENOUS | Status: AC
  Administered 2019-09-11: 15:00:00 via INTRAVENOUS
  Filled 2019-09-11: qty 250

## 2019-09-11 MED ORDER — FAMOTIDINE 20 MG PO TABS
20.0000 mg | ORAL_TABLET | Freq: Once | ORAL | Status: AC
  Administered 2019-09-11: 20 mg via ORAL

## 2019-09-11 MED ORDER — DIPHENHYDRAMINE HCL 25 MG PO CAPS
ORAL_CAPSULE | ORAL | Status: AC
  Filled 2019-09-11: qty 1

## 2019-09-11 MED ORDER — PROCHLORPERAZINE MALEATE 10 MG PO TABS
ORAL_TABLET | ORAL | Status: AC
  Filled 2019-09-11: qty 1

## 2019-09-11 MED ORDER — SODIUM CHLORIDE 0.9% FLUSH
10.0000 mL | INTRAVENOUS | Status: DC | PRN
  Administered 2019-09-11: 10 mL
  Filled 2019-09-11: qty 10

## 2019-09-11 MED ORDER — DEXAMETHASONE 4 MG PO TABS
ORAL_TABLET | ORAL | Status: AC
  Filled 2019-09-11: qty 1

## 2019-09-11 MED ORDER — DEXTROSE 5 % IV SOLN
27.0000 mg/m2 | Freq: Once | INTRAVENOUS | Status: AC
  Administered 2019-09-11: 15:00:00 50 mg via INTRAVENOUS
  Filled 2019-09-11: qty 10

## 2019-09-11 MED ORDER — ACETAMINOPHEN 325 MG PO TABS
ORAL_TABLET | ORAL | Status: AC
  Filled 2019-09-11: qty 2

## 2019-09-11 MED ORDER — INFLUENZA VAC A&B SA ADJ QUAD 0.5 ML IM PRSY
PREFILLED_SYRINGE | INTRAMUSCULAR | Status: AC
  Filled 2019-09-11: qty 0.5

## 2019-09-11 MED ORDER — DEXAMETHASONE 4 MG PO TABS
20.0000 mg | ORAL_TABLET | Freq: Once | ORAL | Status: AC
  Administered 2019-09-11: 20 mg via ORAL

## 2019-09-11 MED ORDER — FENTANYL 12 MCG/HR TD PT72: 1.0000 | MEDICATED_PATCH | TRANSDERMAL | 0 refills | Status: DC

## 2019-09-11 MED ORDER — PROCHLORPERAZINE MALEATE 10 MG PO TABS
10.0000 mg | ORAL_TABLET | Freq: Once | ORAL | Status: AC
  Administered 2019-09-11: 10 mg via ORAL

## 2019-09-11 MED ORDER — INFLUENZA VAC A&B SA ADJ QUAD 0.5 ML IM PRSY
0.5000 mL | PREFILLED_SYRINGE | Freq: Once | INTRAMUSCULAR | Status: AC
  Administered 2019-09-11: 0.5 mL via INTRAMUSCULAR

## 2019-09-11 MED ORDER — DIPHENHYDRAMINE HCL 25 MG PO CAPS
25.0000 mg | ORAL_CAPSULE | Freq: Once | ORAL | Status: AC
  Administered 2019-09-11: 25 mg via ORAL

## 2019-09-11 MED ORDER — ACETAMINOPHEN 325 MG PO TABS
650.0000 mg | ORAL_TABLET | Freq: Once | ORAL | Status: AC
  Administered 2019-09-11: 15:00:00 650 mg via ORAL

## 2019-09-11 MED ORDER — SODIUM CHLORIDE 0.9 % IV SOLN
Freq: Once | INTRAVENOUS | Status: AC
  Administered 2019-09-11: 14:00:00 via INTRAVENOUS
  Filled 2019-09-11: qty 250

## 2019-09-11 MED ORDER — HEPARIN SOD (PORK) LOCK FLUSH 100 UNIT/ML IV SOLN
500.0000 [IU] | Freq: Once | INTRAVENOUS | Status: AC | PRN
  Administered 2019-09-11: 500 [IU]
  Filled 2019-09-11: qty 5

## 2019-09-11 NOTE — Patient Instructions (Signed)
[facility] Discharge Instructions for Patients Receiving Chemotherapy  Today you received the following chemotherapy agents Carfilzomib (KYPROLIS).  To help prevent nausea and vomiting after your treatment, we encourage you to take your nausea medication as prescribed.   If you develop nausea and vomiting that is not controlled by your nausea medication, call the clinic.   BELOW ARE SYMPTOMS THAT SHOULD BE REPORTED IMMEDIATELY:  *FEVER GREATER THAN 100.5 F  *CHILLS WITH OR WITHOUT FEVER  NAUSEA AND VOMITING THAT IS NOT CONTROLLED WITH YOUR NAUSEA MEDICATION  *UNUSUAL SHORTNESS OF BREATH  *UNUSUAL BRUISING OR BLEEDING  TENDERNESS IN MOUTH AND THROAT WITH OR WITHOUT PRESENCE OF ULCERS  *URINARY PROBLEMS  *BOWEL PROBLEMS  UNUSUAL RASH Items with * indicate a potential emergency and should be followed up as soon as possible.  Feel free to call the clinic should you have any questions or concerns. The clinic phone number is [phone].  Please show the [place] at check-in to the Emergency Department and triage nurse.  Influenza Virus Vaccine injection What is this medicine? INFLUENZA VIRUS VACCINE (in floo EN zuh VAHY ruhs vak SEEN) helps to reduce the risk of getting influenza also known as the flu. The vaccine only helps protect you against some strains of the flu. This medicine may be used for other purposes; ask your health care provider or pharmacist if you have questions. COMMON BRAND NAME(S): Afluria, Afluria Quadrivalent, Agriflu, Alfuria, FLUAD, Fluarix, Fluarix Quadrivalent, Flublok, Flublok Quadrivalent, FLUCELVAX, Flulaval, Fluvirin, Fluzone, Fluzone High-Dose, Fluzone Intradermal What should I tell my health care provider before I take this medicine? They need to know if you have any of these conditions:  bleeding disorder like hemophilia  fever or infection  Guillain-Barre syndrome or other neurological problems  immune system  problems  infection with the human immunodeficiency virus (HIV) or AIDS  low blood platelet counts  multiple sclerosis  an unusual or allergic reaction to influenza virus vaccine, latex, other medicines, foods, dyes, or preservatives. Different brands of vaccines contain different allergens. Some may contain latex or eggs. Talk to your doctor about your allergies to make sure that you get the right vaccine.  pregnant or trying to get pregnant  breast-feeding How should I use this medicine? This vaccine is for injection into a muscle or under the skin. It is given by a health care professional. A copy of Vaccine Information Statements will be given before each vaccination. Read this sheet carefully each time. The sheet may change frequently. Talk to your healthcare provider to see which vaccines are right for you. Some vaccines should not be used in all age groups. Overdosage: If you think you have taken too much of this medicine contact a poison control center or emergency room at once. NOTE: This medicine is only for you. Do not share this medicine with others. What if I miss a dose? This does not apply. What may interact with this medicine?  chemotherapy or radiation therapy  medicines that lower your immune system like etanercept, anakinra, infliximab, and adalimumab  medicines that treat or prevent blood clots like warfarin  phenytoin  steroid medicines like prednisone or cortisone  theophylline  vaccines This list may not describe all possible interactions. Give your health care provider a list of all the medicines, herbs, non-prescription drugs, or dietary supplements you use. Also tell them if you smoke, drink alcohol, or use illegal drugs. Some items may interact with your medicine. What should I watch for while using  this medicine? Report any side effects that do not go away within 3 days to your doctor or health care professional. Call your health care provider if any  unusual symptoms occur within 6 weeks of receiving this vaccine. You may still catch the flu, but the illness is not usually as bad. You cannot get the flu from the vaccine. The vaccine will not protect against colds or other illnesses that may cause fever. The vaccine is needed every year. What side effects may I notice from receiving this medicine? Side effects that you should report to your doctor or health care professional as soon as possible:  allergic reactions like skin rash, itching or hives, swelling of the face, lips, or tongue Side effects that usually do not require medical attention (report to your doctor or health care professional if they continue or are bothersome):  fever  headache  muscle aches and pains  pain, tenderness, redness, or swelling at the injection site  tiredness This list may not describe all possible side effects. Call your doctor for medical advice about side effects. You may report side effects to FDA at 1-800-FDA-1088. Where should I keep my medicine? The vaccine will be given by a health care professional in a clinic, pharmacy, doctor's office, or other health care setting. You will not be given vaccine doses to store at home. NOTE: This sheet is a summary. It may not cover all possible information. If you have questions about this medicine, talk to your doctor, pharmacist, or health care provider.  2020 Elsevier/Gold Standard (2018-10-25 08:45:43)  Coronavirus (COVID-19) Are you at risk?  Are you at risk for the Coronavirus (COVID-19)?  To be considered HIGH RISK for Coronavirus (COVID-19), you have to meet the following criteria:  . Traveled to [place], [place], [place], [place] or [place]; or in the [place] to [place], [place], [place], or [place]; and have fever, cough, and shortness of breath within the last 2 weeks of travel OR . Been in close contact with a person diagnosed with COVID-19 within the last 2 weeks and have fever,  cough, and shortness of breath . IF YOU DO NOT MEET THESE CRITERIA, YOU ARE CONSIDERED LOW RISK FOR COVID-19.  What to do if you are HIGH RISK for COVID-19?  Marland Kitchen If you are having a medical emergency, call 911. . Seek medical care right away. Before you go to a doctor's office, urgent care or emergency department, call ahead and tell them about your recent travel, contact with someone diagnosed with COVID-19, and your symptoms. You should receive instructions from your physician's office regarding next steps of care.  . When you arrive at healthcare provider, tell the healthcare staff immediately you have returned from visiting [place], [place], [place], [place] or [place]; or traveled in the [place] to [place], [place], [place], or [place]; in the last two weeks or you have been in close contact with a person diagnosed with COVID-19 in the last 2 weeks.   . Tell the health care staff about your symptoms: fever, cough and shortness of breath. . After you have been seen by a medical provider, you will be either: o Tested for (COVID-19) and discharged home on quarantine except to seek medical care if symptoms worsen, and asked to  - Stay home and avoid contact with others until you get your results (4-5 days)  - Avoid travel on public transportation if possible (such as bus, train, or airplane) or o [employer]  to the Emergency Department by EMS for evaluation, COVID-19 testing, and possible admission depending on your condition and test results.  What to do if you are LOW RISK for COVID-19?  Reduce your risk of any infection by using the same precautions used for avoiding the common cold or flu:  Marland Kitchen Wash your hands often with soap and warm water for at least 20 seconds.  If soap and water are not readily available, use an alcohol-based hand sanitizer with at least 60% alcohol.  . If coughing or sneezing, cover your mouth and nose by coughing or sneezing into the elbow areas of your shirt or coat,  into a tissue or into your sleeve (not your hands). . Avoid shaking hands with others and consider head nods or verbal greetings only. . Avoid touching your eyes, nose, or mouth with unwashed hands.  . Avoid close contact with people who are sick. . Avoid places or events with large numbers of people in one location, like concerts or sporting events. . Carefully consider travel plans you have or are making. . If you are planning any travel outside or inside the [name], visit the CDC's Travelers' Health webpage for the latest health notices. . If you have some symptoms but not all symptoms, continue to monitor at home and seek medical attention if your symptoms worsen. . If you are having a medical emergency, call 911.   Pillager / e-Visit: [email or web address]         [facility]: [facility]: [phone]                   [facility]: [phone]

## 2019-09-12 ENCOUNTER — Other Ambulatory Visit: Payer: Self-pay | Admitting: Urology

## 2019-09-12 ENCOUNTER — Inpatient Hospital Stay: Payer: Medicare Other

## 2019-09-12 ENCOUNTER — Other Ambulatory Visit: Payer: Self-pay

## 2019-09-12 VITALS — BP 136/83 | HR 86 | Temp 98.5°F | Resp 18

## 2019-09-12 DIAGNOSIS — Z5112 Encounter for antineoplastic immunotherapy: Secondary | ICD-10-CM | POA: Diagnosis not present

## 2019-09-12 DIAGNOSIS — Z7189 Other specified counseling: Secondary | ICD-10-CM

## 2019-09-12 DIAGNOSIS — C9000 Multiple myeloma not having achieved remission: Secondary | ICD-10-CM

## 2019-09-12 MED ORDER — FAMOTIDINE 20 MG PO TABS
20.0000 mg | ORAL_TABLET | Freq: Once | ORAL | Status: AC
  Administered 2019-09-12: 20 mg via ORAL

## 2019-09-12 MED ORDER — DEXAMETHASONE 4 MG PO TABS
20.0000 mg | ORAL_TABLET | Freq: Once | ORAL | Status: AC
  Administered 2019-09-12: 20 mg via ORAL

## 2019-09-12 MED ORDER — SODIUM CHLORIDE 0.9 % IV SOLN
Freq: Once | INTRAVENOUS | Status: AC
  Administered 2019-09-12: 13:00:00 via INTRAVENOUS
  Filled 2019-09-12: qty 250

## 2019-09-12 MED ORDER — SODIUM CHLORIDE 0.9% FLUSH
10.0000 mL | INTRAVENOUS | Status: DC | PRN
  Administered 2019-09-12: 10 mL
  Filled 2019-09-12: qty 10

## 2019-09-12 MED ORDER — HEPARIN SOD (PORK) LOCK FLUSH 100 UNIT/ML IV SOLN
500.0000 [IU] | Freq: Once | INTRAVENOUS | Status: AC | PRN
  Administered 2019-09-12: 500 [IU]
  Filled 2019-09-12: qty 5

## 2019-09-12 MED ORDER — PROCHLORPERAZINE MALEATE 10 MG PO TABS
10.0000 mg | ORAL_TABLET | Freq: Once | ORAL | Status: AC
  Administered 2019-09-12: 10 mg via ORAL

## 2019-09-12 MED ORDER — DEXAMETHASONE 4 MG PO TABS
ORAL_TABLET | ORAL | Status: AC
  Filled 2019-09-12: qty 5

## 2019-09-12 MED ORDER — ACETAMINOPHEN 325 MG PO TABS
ORAL_TABLET | ORAL | Status: AC
  Filled 2019-09-12: qty 2

## 2019-09-12 MED ORDER — FAMOTIDINE 20 MG PO TABS
ORAL_TABLET | ORAL | Status: AC
  Filled 2019-09-12: qty 1

## 2019-09-12 MED ORDER — ACETAMINOPHEN 325 MG PO TABS
650.0000 mg | ORAL_TABLET | Freq: Once | ORAL | Status: AC
  Administered 2019-09-12: 13:00:00 650 mg via ORAL

## 2019-09-12 MED ORDER — DIPHENHYDRAMINE HCL 25 MG PO CAPS
ORAL_CAPSULE | ORAL | Status: AC
  Filled 2019-09-12: qty 1

## 2019-09-12 MED ORDER — PROCHLORPERAZINE MALEATE 10 MG PO TABS
ORAL_TABLET | ORAL | Status: AC
  Filled 2019-09-12: qty 1

## 2019-09-12 MED ORDER — DIPHENHYDRAMINE HCL 25 MG PO TABS
25.0000 mg | ORAL_TABLET | Freq: Once | ORAL | Status: AC
  Administered 2019-09-12: 25 mg via ORAL
  Filled 2019-09-12: qty 1

## 2019-09-12 MED ORDER — DEXTROSE 5 % IV SOLN
27.0000 mg/m2 | Freq: Once | INTRAVENOUS | Status: AC
  Administered 2019-09-12: 50 mg via INTRAVENOUS
  Filled 2019-09-12: qty 15

## 2019-09-12 NOTE — Patient Instructions (Signed)
[facility] Discharge Instructions for Patients Receiving Chemotherapy  Today you received the following chemotherapy agents Carfilzomib (KYPROLIS).  To help prevent nausea and vomiting after your treatment, we encourage you to take your nausea medication as prescribed.   If you develop nausea and vomiting that is not controlled by your nausea medication, call the clinic.   BELOW ARE SYMPTOMS THAT SHOULD BE REPORTED IMMEDIATELY:  *FEVER GREATER THAN 100.5 F  *CHILLS WITH OR WITHOUT FEVER  NAUSEA AND VOMITING THAT IS NOT CONTROLLED WITH YOUR NAUSEA MEDICATION  *UNUSUAL SHORTNESS OF BREATH  *UNUSUAL BRUISING OR BLEEDING  TENDERNESS IN MOUTH AND THROAT WITH OR WITHOUT PRESENCE OF ULCERS  *URINARY PROBLEMS  *BOWEL PROBLEMS  UNUSUAL RASH Items with * indicate a potential emergency and should be followed up as soon as possible.  Feel free to call the clinic should you have any questions or concerns. The clinic phone number is [phone].  Please show the [place] [facility] at check-in to the Emergency Department and triage nurse.  Influenza Virus Vaccine injection What is this medicine? INFLUENZA VIRUS VACCINE (in floo EN zuh VAHY ruhs vak SEEN) helps to reduce the risk of getting influenza also known as the flu. The vaccine only helps protect you against some strains of the flu. This medicine may be used for other purposes; ask your health care provider or pharmacist if you have questions. COMMON BRAND NAME(S): Afluria, Afluria Quadrivalent, Agriflu, Alfuria, FLUAD, Fluarix, Fluarix Quadrivalent, Flublok, Flublok Quadrivalent, FLUCELVAX, Flulaval, Fluvirin, Fluzone, Fluzone High-Dose, Fluzone Intradermal What should I tell my health care provider before I take this medicine? They need to know if you have any of these conditions:  bleeding disorder like hemophilia  fever or infection  Guillain-Barre syndrome or other neurological problems  immune system  problems  infection with the human immunodeficiency virus (HIV) or AIDS  low blood platelet counts  multiple sclerosis  an unusual or allergic reaction to influenza virus vaccine, latex, other medicines, foods, dyes, or preservatives. Different brands of vaccines contain different allergens. Some may contain latex or eggs. Talk to your doctor about your allergies to make sure that you get the right vaccine.  pregnant or trying to get pregnant  breast-feeding How should I use this medicine? This vaccine is for injection into a muscle or under the skin. It is given by a health care professional. A copy of Vaccine Information Statements will be given before each vaccination. Read this sheet carefully each time. The sheet may change frequently. Talk to your healthcare provider to see which vaccines are right for you. Some vaccines should not be used in all age groups. Overdosage: If you think you have taken too much of this medicine contact a poison control center or emergency room at once. NOTE: This medicine is only for you. Do not share this medicine with others. What if I miss a dose? This does not apply. What may interact with this medicine?  chemotherapy or radiation therapy  medicines that lower your immune system like etanercept, anakinra, infliximab, and adalimumab  medicines that treat or prevent blood clots like warfarin  phenytoin  steroid medicines like prednisone or cortisone  theophylline  vaccines This list may not describe all possible interactions. Give your health care provider a list of all the medicines, herbs, non-prescription drugs, or dietary supplements you use. Also tell them if you smoke, drink alcohol, or use illegal drugs. Some items may interact with your medicine. What should I watch for while using  this medicine? Report any side effects that do not go away within 3 days to your doctor or health care professional. Call your health care provider if any  unusual symptoms occur within 6 weeks of receiving this vaccine. You may still catch the flu, but the illness is not usually as bad. You cannot get the flu from the vaccine. The vaccine will not protect against colds or other illnesses that may cause fever. The vaccine is needed every year. What side effects may I notice from receiving this medicine? Side effects that you should report to your doctor or health care professional as soon as possible:  allergic reactions like skin rash, itching or hives, swelling of the face, lips, or tongue Side effects that usually do not require medical attention (report to your doctor or health care professional if they continue or are bothersome):  fever  headache  muscle aches and pains  pain, tenderness, redness, or swelling at the injection site  tiredness This list may not describe all possible side effects. Call your doctor for medical advice about side effects. You may report side effects to FDA at 1-800-FDA-1088. Where should I keep my medicine? The vaccine will be given by a health care professional in a clinic, pharmacy, doctor's office, or other health care setting. You will not be given vaccine doses to store at home. NOTE: This sheet is a summary. It may not cover all possible information. If you have questions about this medicine, talk to your doctor, pharmacist, or health care provider.  2020 Elsevier/Gold Standard (2018-10-25 08:45:43)  Coronavirus (COVID-19) Are you at risk?  Are you at risk for the Coronavirus (COVID-19)?  To be considered HIGH RISK for Coronavirus (COVID-19), you have to meet the following criteria:  . Traveled to [place], [place], [place], [place] or [place]; or in the [place] to [place], [place], [place], or [place]; and have fever, cough, and shortness of breath within the last 2 weeks of travel OR . Been in close contact with a person diagnosed with COVID-19 within the last 2 weeks and have fever,  cough, and shortness of breath . IF YOU DO NOT MEET THESE CRITERIA, YOU ARE CONSIDERED LOW RISK FOR COVID-19.  What to do if you are HIGH RISK for COVID-19?  Marland Kitchen If you are having a medical emergency, call 911. . Seek medical care right away. Before you go to a doctor's office, urgent care or emergency department, call ahead and tell them about your recent travel, contact with someone diagnosed with COVID-19, and your symptoms. You should receive instructions from your physician's office regarding next steps of care.  . When you arrive at healthcare provider, tell the healthcare staff immediately you have returned from visiting [place], [place], [place], [place] or [place]; or traveled in the [place] to [place], [place], [place], or [place]; in the last two weeks or you have been in close contact with a person diagnosed with COVID-19 in the last 2 weeks.   . Tell the health care staff about your symptoms: fever, cough and shortness of breath. . After you have been seen by a medical provider, you will be either: o Tested for (COVID-19) and discharged home on quarantine except to seek medical care if symptoms worsen, and asked to  - Stay home and avoid contact with others until you get your results (4-5 days)  - Avoid travel on public transportation if possible (such as bus, train, or airplane) or o [employer]  to the Emergency Department by EMS for evaluation, COVID-19 testing, and possible admission depending on your condition and test results.  What to do if you are LOW RISK for COVID-19?  Reduce your risk of any infection by using the same precautions used for avoiding the common cold or flu:  Marland Kitchen Wash your hands often with soap and warm water for at least 20 seconds.  If soap and water are not readily available, use an alcohol-based hand sanitizer with at least 60% alcohol.  . If coughing or sneezing, cover your mouth and nose by coughing or sneezing into the elbow areas of your shirt or coat,  into a tissue or into your sleeve (not your hands). . Avoid shaking hands with others and consider head nods or verbal greetings only. . Avoid touching your eyes, nose, or mouth with unwashed hands.  . Avoid close contact with people who are sick. . Avoid places or events with large numbers of people in one location, like concerts or sporting events. . Carefully consider travel plans you have or are making. . If you are planning any travel outside or inside the [name], visit the CDC's Travelers' Health webpage for the latest health notices. . If you have some symptoms but not all symptoms, continue to monitor at home and seek medical attention if your symptoms worsen. . If you are having a medical emergency, call 911.   Pillager / e-Visit: [email or web address]         [facility]: [facility]: [phone]                   [facility]: [phone]

## 2019-09-14 ENCOUNTER — Encounter (HOSPITAL_BASED_OUTPATIENT_CLINIC_OR_DEPARTMENT_OTHER): Payer: Self-pay | Admitting: *Deleted

## 2019-09-14 ENCOUNTER — Other Ambulatory Visit: Payer: Self-pay

## 2019-09-14 ENCOUNTER — Ambulatory Visit (HOSPITAL_COMMUNITY)
Admission: RE | Admit: 2019-09-14 | Discharge: 2019-09-14 | Disposition: A | Discharge status: Home / Self Care | Payer: Medicare Other | Source: Ambulatory Visit | Attending: Hematology | Admitting: Hematology

## 2019-09-14 DIAGNOSIS — Z20822 Contact with and (suspected) exposure to covid-19: Secondary | ICD-10-CM

## 2019-09-14 DIAGNOSIS — I2510 Atherosclerotic heart disease of native coronary artery without angina pectoris: Secondary | ICD-10-CM | POA: Insufficient documentation

## 2019-09-14 DIAGNOSIS — N132 Hydronephrosis with renal and ureteral calculous obstruction: Secondary | ICD-10-CM | POA: Diagnosis not present

## 2019-09-14 DIAGNOSIS — C9000 Multiple myeloma not having achieved remission: Secondary | ICD-10-CM

## 2019-09-14 LAB — GLUCOSE, CAPILLARY: Glucose-Capillary: 140 mg/dL — ABNORMAL HIGH (ref 70–99)

## 2019-09-14 MED ORDER — FLUDEOXYGLUCOSE F - 18 (FDG) INJECTION
8.5200 | Freq: Once | INTRAVENOUS | Status: AC
  Administered 2019-09-14: 09:00:00 8.52 via INTRAVENOUS

## 2019-09-14 NOTE — Progress Notes (Addendum)
Spoke with patient via telephone for pre op interview. NPO after MN. Patient to take Zoloft zofran and oxycodone if needed AM of surgery with a sip of water. Will need ISTAT 4. Arrival time 0630.  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 Also made aware that patient uses Fentanyl patches for pain management.

## 2019-09-15 ENCOUNTER — Other Ambulatory Visit: Payer: Self-pay | Admitting: Radiology

## 2019-09-16 ENCOUNTER — Other Ambulatory Visit (HOSPITAL_COMMUNITY)
Admission: RE | Admit: 2019-09-16 | Discharge: 2019-09-16 | Disposition: A | Discharge status: Home / Self Care | Payer: Medicare Other | Source: Ambulatory Visit | Attending: Urology | Admitting: Urology

## 2019-09-16 DIAGNOSIS — Z01812 Encounter for preprocedural laboratory examination: Secondary | ICD-10-CM | POA: Diagnosis present

## 2019-09-16 DIAGNOSIS — Z20828 Contact with and (suspected) exposure to other viral communicable diseases: Secondary | ICD-10-CM | POA: Diagnosis not present

## 2019-09-17 ENCOUNTER — Other Ambulatory Visit: Payer: Self-pay | Admitting: Hematology

## 2019-09-17 DIAGNOSIS — Z7189 Other specified counseling: Secondary | ICD-10-CM

## 2019-09-17 DIAGNOSIS — C9000 Multiple myeloma not having achieved remission: Secondary | ICD-10-CM

## 2019-09-18 ENCOUNTER — Ambulatory Visit (HOSPITAL_COMMUNITY)
Admission: RE | Admit: 2019-09-18 | Discharge: 2019-09-18 | Disposition: A | Discharge status: Home / Self Care | Payer: Medicare Other | Source: Ambulatory Visit | Attending: Hematology | Admitting: Hematology

## 2019-09-18 ENCOUNTER — Other Ambulatory Visit: Payer: Self-pay

## 2019-09-18 ENCOUNTER — Encounter (HOSPITAL_COMMUNITY): Payer: Self-pay

## 2019-09-18 DIAGNOSIS — Z8262 Family history of osteoporosis: Secondary | ICD-10-CM | POA: Insufficient documentation

## 2019-09-18 DIAGNOSIS — E669 Obesity, unspecified: Secondary | ICD-10-CM | POA: Diagnosis not present

## 2019-09-18 DIAGNOSIS — Z7982 Long term (current) use of aspirin: Secondary | ICD-10-CM | POA: Insufficient documentation

## 2019-09-18 DIAGNOSIS — F329 Major depressive disorder, single episode, unspecified: Secondary | ICD-10-CM | POA: Insufficient documentation

## 2019-09-18 DIAGNOSIS — Z882 Allergy status to sulfonamides status: Secondary | ICD-10-CM | POA: Insufficient documentation

## 2019-09-18 DIAGNOSIS — C9000 Multiple myeloma not having achieved remission: Secondary | ICD-10-CM | POA: Diagnosis not present

## 2019-09-18 DIAGNOSIS — M8580 Other specified disorders of bone density and structure, unspecified site: Secondary | ICD-10-CM | POA: Diagnosis not present

## 2019-09-18 DIAGNOSIS — Z79899 Other long term (current) drug therapy: Secondary | ICD-10-CM | POA: Insufficient documentation

## 2019-09-18 DIAGNOSIS — Z833 Family history of diabetes mellitus: Secondary | ICD-10-CM | POA: Insufficient documentation

## 2019-09-18 DIAGNOSIS — J45909 Unspecified asthma, uncomplicated: Secondary | ICD-10-CM | POA: Insufficient documentation

## 2019-09-18 DIAGNOSIS — Z886 Allergy status to analgesic agent status: Secondary | ICD-10-CM | POA: Diagnosis not present

## 2019-09-18 DIAGNOSIS — E119 Type 2 diabetes mellitus without complications: Secondary | ICD-10-CM | POA: Diagnosis not present

## 2019-09-18 DIAGNOSIS — Z809 Family history of malignant neoplasm, unspecified: Secondary | ICD-10-CM | POA: Insufficient documentation

## 2019-09-18 DIAGNOSIS — Z8052 Family history of malignant neoplasm of bladder: Secondary | ICD-10-CM | POA: Insufficient documentation

## 2019-09-18 DIAGNOSIS — M170 Bilateral primary osteoarthritis of knee: Secondary | ICD-10-CM | POA: Insufficient documentation

## 2019-09-18 DIAGNOSIS — Z7984 Long term (current) use of oral hypoglycemic drugs: Secondary | ICD-10-CM | POA: Insufficient documentation

## 2019-09-18 DIAGNOSIS — E785 Hyperlipidemia, unspecified: Secondary | ICD-10-CM | POA: Diagnosis not present

## 2019-09-18 DIAGNOSIS — Z8349 Family history of other endocrine, nutritional and metabolic diseases: Secondary | ICD-10-CM | POA: Diagnosis not present

## 2019-09-18 DIAGNOSIS — Z8249 Family history of ischemic heart disease and other diseases of the circulatory system: Secondary | ICD-10-CM | POA: Insufficient documentation

## 2019-09-18 DIAGNOSIS — Z880 Allergy status to penicillin: Secondary | ICD-10-CM | POA: Diagnosis not present

## 2019-09-18 LAB — CBC WITH DIFFERENTIAL/PLATELET
Abs Immature Granulocytes: 0.31 10*3/uL — ABNORMAL HIGH (ref 0.00–0.07)
Basophils Absolute: 0.1 10*3/uL (ref 0.0–0.1)
Basophils Relative: 1 %
Eosinophils Absolute: 0.5 10*3/uL (ref 0.0–0.5)
Eosinophils Relative: 6 %
HCT: 39.9 % (ref 36.0–46.0)
Hemoglobin: 13.2 g/dL (ref 12.0–15.0)
Immature Granulocytes: 4 %
Lymphocytes Relative: 24 %
Lymphs Abs: 2 10*3/uL (ref 0.7–4.0)
MCH: 31.4 pg (ref 26.0–34.0)
MCHC: 33.1 g/dL (ref 30.0–36.0)
MCV: 94.8 fL (ref 80.0–100.0)
Monocytes Absolute: 2 10*3/uL — ABNORMAL HIGH (ref 0.1–1.0)
Monocytes Relative: 25 %
Neutro Abs: 3.4 10*3/uL (ref 1.7–7.7)
Neutrophils Relative %: 40 %
Platelets: 201 10*3/uL (ref 150–400)
RBC: 4.21 MIL/uL (ref 3.87–5.11)
RDW: 14 % (ref 11.5–15.5)
WBC: 8.3 10*3/uL (ref 4.0–10.5)
nRBC: 0 % (ref 0.0–0.2)

## 2019-09-18 LAB — NOVEL CORONAVIRUS, NAA (HOSP ORDER, SEND-OUT TO REF LAB; TAT 18-24 HRS): SARS-CoV-2, NAA: NOT DETECTED

## 2019-09-18 LAB — GLUCOSE, CAPILLARY: Glucose-Capillary: 155 mg/dL — ABNORMAL HIGH (ref 70–99)

## 2019-09-18 LAB — PROTIME-INR
INR: 0.9 (ref 0.8–1.2)
Prothrombin Time: 12.2 seconds (ref 11.4–15.2)

## 2019-09-18 MED ORDER — FENTANYL CITRATE (PF) 100 MCG/2ML IJ SOLN
INTRAMUSCULAR | Status: AC
  Filled 2019-09-18: qty 2

## 2019-09-18 MED ORDER — MIDAZOLAM HCL 2 MG/2ML IJ SOLN
INTRAMUSCULAR | Status: AC | PRN
  Administered 2019-09-18 (×2): 1 mg via INTRAVENOUS

## 2019-09-18 MED ORDER — NALOXONE HCL 0.4 MG/ML IJ SOLN
INTRAMUSCULAR | Status: AC
  Filled 2019-09-18: qty 1

## 2019-09-18 MED ORDER — LIDOCAINE-EPINEPHRINE (PF) 1 %-1:200000 IJ SOLN
INTRAMUSCULAR | Status: AC | PRN
  Administered 2019-09-18: 10 mL

## 2019-09-18 MED ORDER — FENTANYL CITRATE (PF) 100 MCG/2ML IJ SOLN
INTRAMUSCULAR | Status: AC | PRN
  Administered 2019-09-18 (×2): 50 ug via INTRAVENOUS

## 2019-09-18 MED ORDER — MIDAZOLAM HCL 2 MG/2ML IJ SOLN
INTRAMUSCULAR | Status: AC
  Filled 2019-09-18: qty 2

## 2019-09-18 MED ORDER — FLUMAZENIL 0.5 MG/5ML IV SOLN
INTRAVENOUS | Status: AC
  Filled 2019-09-18: qty 5

## 2019-09-18 MED ORDER — SODIUM CHLORIDE 0.9 % IV SOLN
INTRAVENOUS | Status: DC
  Administered 2019-09-18: 10:00:00 via INTRAVENOUS

## 2019-09-18 NOTE — H&P (Signed)
Chief Complaint: Patient was seen in consultation today for multiple myeloma  Referring Physician(s): Brunetta Genera  Supervising Physician: Sandi Mariscal  Patient Status: [name] - Out-pt  History of Present Illness: Faith Orr is a 78 y.o. female known to IR from recent bone marrow biopsies (01/06/19, 05/30/19) and Port-A-Cath placement (02/20/19) who is referred back to IR today for repeat bone marrow biopsy to evaluate for treatment response.   She presents to IR today in her usual state of health. Denies fever, chills, cough, shortness of breath, nausea, abdominal pain.  She has been NPO.  She does not take blood thinners.   Past Medical History:  Diagnosis Date  . Allergy    seasonal  . Asthma   . DEPRESSION   . DIABETES MELLITUS, TYPE II   . Diverticulosis   . HYPERLIPIDEMIA   . Macular degeneration of left eye    mild, Dr.Hecker  . Obesity, unspecified   . Osteoarthritis of both knees   . OSTEOPENIA   . Osteopenia   . URINARY INCONTINENCE     Past Surgical History:  Procedure Laterality Date  . BREAST SURGERY    . CATARACT EXTRACTION Left 05/24/2018  . CESAREAN SECTION  01/1973  . FRACTURE SURGERY    . IR IMAGING GUIDED PORT INSERTION  02/20/2019  . left wrist surgery  2008   By Dr. Latanya Maudlin  . right ankle  1994    Allergies: Penicillins, Aleve [naproxen sodium], and Sulfonamide derivatives  Medications: Prior to Admission medications   Medication Sig Start Date End Date Taking? Authorizing Provider  acyclovir (ZOVIRAX) 400 MG tablet TAKE 1 TABLET(400 MG) BY MOUTH TWICE DAILY 08/21/19  Yes Brunetta Genera, MD  aspirin EC 81 MG tablet Take 81 mg by mouth daily after breakfast.    Yes [provider]  Blood Glucose Monitoring Suppl (FREESTYLE FREEDOM LITE) W/DEVICE KIT Use to check blood sugars twice a day Dx 250.00 06/01/14  Yes Rowe Clack, MD  Calcium Carbonate-Vitamin D (CALCIUM 600+D HIGH POTENCY) 600-400 MG-UNIT per tablet Take 1  tablet by mouth 2 (two) times daily.    Yes [provider]  Cetirizine HCl 10 MG CAPS Take 1 capsule (10 mg total) by mouth daily. 03/09/19  Yes Tanner, Lyndon Code., PA-C  Continuous Blood Gluc Sensor (FREESTYLE LIBRE 14 DAY SENSOR) MISC 1 each by Does not apply route every 14 (fourteen) days. 02/07/19  Yes Hoyt Koch, MD  dexamethasone (DECADRON) 4 MG tablet Take 5 tablets (20 mg total) by mouth once a week. On D22 of each cycle of treatment 03/09/19  Yes Brunetta Genera, MD  DOK 100 MG capsule TAKE 2 CAPSULES BY MOUTH AT BEDTIME 08/21/19  Yes Brunetta Genera, MD  fentaNYL (DURAGESIC) 12 MCG/HR Place 1 patch onto the skin every 3 (three) days. 09/11/19  Yes Brunetta Genera, MD  fexofenadine (ALLEGRA) 60 MG tablet Take 1 tablet (60 mg total) by mouth 2 (two) times daily. 03/09/19  Yes Tanner, Lyndon Code., PA-C  fluticasone (FLONASE) 50 MCG/ACT nasal spray Place 1 spray into both nostrils daily. Patient taking differently: Place 1 spray into both nostrils daily as needed for allergies or rhinitis.  03/09/18  Yes Hoyt Koch, MD  glipiZIDE (GLUCOTROL XL) 5 MG 24 hr tablet TAKE 1 TABLET(5 MG) BY MOUTH DAILY WITH BREAKFAST 09/04/19  Yes Hoyt Koch, MD  glucose blood (FREESTYLE LITE) test strip CHECK BLOOD SUGAR TWICE DAILY AS DIRECTED Dx 250.00 07/13/14  Yes Asa Lente,  Jannifer Rodney, MD  Lancets (FREESTYLE) lancets Use twice daily to check sugars. 04/15/16  Yes Hoyt Koch, MD  lidocaine-prilocaine (EMLA) cream APPLY 1 APPLICATION TO THE AFFECTED AREA AS NEEDED. USE PRIOR TO PORT ACCESS 05/08/19  Yes Brunetta Genera, MD  LORazepam (ATIVAN) 0.5 MG tablet Take 1 tablet (0.5 mg total) by mouth every 8 (eight) hours as needed for anxiety (significant essential tremors). 04/27/19  Yes Brunetta Genera, MD  metFORMIN (GLUCOPHAGE-XR) 500 MG 24 hr tablet TAKE 3 TABLETS(1500 MG) BY MOUTH DAILY WITH BREAKFAST 07/07/19  Yes Hoyt Koch, MD  Multiple  Vitamins-Minerals (ICAPS) CAPS Take 1 capsule by mouth daily after breakfast.    Yes [provider]  ondansetron (ZOFRAN) 8 MG tablet Take 1 tablet (8 mg total) by mouth 2 (two) times daily as needed (Nausea or vomiting). 07/25/19  Yes Brunetta Genera, MD  Oxycodone HCl 10 MG TABS Take 1 tablet (10 mg total) by mouth every 6 (six) hours as needed. 09/04/19  Yes Brunetta Genera, MD  pantoprazole (PROTONIX) 20 MG tablet TAKE 1 TABLET(20 MG) BY MOUTH DAILY 11/19/17  Yes Hoyt Koch, MD  polyethylene glycol (MIRALAX / GLYCOLAX) packet Take 17 g by mouth daily after breakfast.    Yes [provider]  prochlorperazine (COMPAZINE) 10 MG tablet Take 1 tablet (10 mg total) by mouth every 6 (six) hours as needed (Nausea or vomiting). 01/16/19  Yes Kale, Cloria Spring, MD  REVLIMID 15 MG capsule TAKE 1 CAPSULE BY MOUTH  DAILY TAKE FOR 21 DAYS ON,  7 DAYS OFF, REPEAT EVERY 28 DAYS 08/28/19  Yes Brunetta Genera, MD  sertraline (ZOLOFT) 50 MG tablet TAKE 1 TABLET BY MOUTH DAILY 05/01/19  Yes Hoyt Koch, MD  simvastatin (ZOCOR) 20 MG tablet TAKE 1 TABLET(20 MG) BY MOUTH DAILY 05/01/19  Yes Hoyt Koch, MD  Triamcinolone Acetonide 0.025 % LOTN Apply 1 application topically 3 (three) times daily as needed (rash/itching). 03/09/19  Yes Tanner, Lyndon Code., PA-C  Vitamin D, Ergocalciferol, (DRISDOL) 1.25 MG (50000 UT) CAPS capsule TAKE 1 CAPSULE BY MOUTH EVERY 7 DAYS 08/10/19  Yes Hoyt Koch, MD     Family History  Problem Relation Age of Onset  . Diabetes Father   . Hyperlipidemia Father   . Heart disease Father   . Cancer Father   . Hypertension Father   . Colon cancer Paternal Grandmother 40  . Osteoporosis Mother   . Protein S deficiency Mother   . Hyperlipidemia Mother   . Multiple sclerosis Daughter   . Cancer Other        bladder  . Breast cancer Neg Hx     Social History   Socioeconomic History  . Marital status: Married    Spouse  name: Not on file  . Number of children: 1  . Years of education: Not on file  . Highest education level: Not on file  Occupational History    Employer: [employer]  Social Needs  . Financial resource strain: Patient refused  . Food insecurity    Worry: Patient refused    Inability: Patient refused  . Transportation needs    Medical: Patient refused    Non-medical: Patient refused  Tobacco Use  . Smoking status: Never Smoker  . Smokeless tobacco: Never Used  . Tobacco comment: Lives with partner [name]) and son  Substance and Sexual Activity  . Alcohol use: No    Alcohol/week: 0.0 standard drinks  .  Drug use: No  . Sexual activity: Never    Partners: Female    Birth control/protection: Post-menopausal    Comment: Lives with female partner (annette hicks) and 9 yo son  Lifestyle  . Physical activity    Days per week: Patient refused    Minutes per session: Patient refused  . Stress: Patient refused  Relationships  . Social Herbalist on phone: Patient refused    Gets together: Patient refused    Attends religious service: Patient refused    Active member of club or organization: Patient refused    Attends meetings of clubs or organizations: Patient refused    Relationship status: Patient refused  Other Topics Concern  . Not on file  Social History Narrative  . Not on file     Review of Systems: A 12 point ROS discussed and pertinent positives are indicated in the HPI above.  All other systems are negative.  Review of Systems  Constitutional: Negative for fatigue and fever.  Respiratory: Negative for cough and shortness of breath.   Cardiovascular: Negative for chest pain.  Gastrointestinal: Negative for abdominal pain.  Musculoskeletal: Negative for back pain.  Psychiatric/Behavioral: Negative for behavioral problems and confusion.    Vital Signs: BP 130/68 (BP Location: Right Arm)   Pulse 79   Temp 97.8 F (36.6 C) (Oral)    Resp 18   LMP 10/09/2012   SpO2 94%   Physical Exam Vitals signs and nursing note reviewed.  Constitutional:      Appearance: Normal appearance.  HENT:     Mouth/Throat:     Mouth: Mucous membranes are moist.     Pharynx: Oropharynx is clear.  Cardiovascular:     Rate and Rhythm: Normal rate and regular rhythm.  Pulmonary:     Effort: Pulmonary effort is normal. No respiratory distress.     Breath sounds: Normal breath sounds.  Skin:    General: Skin is warm and dry.  Neurological:     General: No focal deficit present.     Mental Status: She is alert and oriented to person, place, and time.  Psychiatric:        Mood and Affect: Mood normal.        Behavior: Behavior normal.        Thought Content: Thought content normal.        Judgment: Judgment normal.      MD Evaluation Airway: WNL Heart: WNL Abdomen: WNL Chest/ Lungs: WNL ASA  Classification: 3 Mallampati/Airway Score: One   Imaging: Nm Pet Image Restage (ps) Whole Body  Result Date: 09/14/2019 CLINICAL DATA:  Subsequent treatment strategy for multiple myeloma. EXAM: NUCLEAR MEDICINE PET WHOLE BODY TECHNIQUE: 8.5 mCi F-18 FDG was injected intravenously. Full-ring PET imaging was performed from the skull base to thigh after the radiotracer. CT data was obtained and used for attenuation correction and anatomic localization. Fasting blood glucose: 140 mg/dl COMPARISON:  Multiple exams, including 05/31/2019 FINDINGS: Mediastinal blood pool activity: SUV max 2.4 HEAD/NECK: Hypermetabolic nodule in the left parapharyngeal space measures 1.2 by 1.0 cm with maximum SUV 12.3, previously 1.3 by 1.1 cm with maximum SUV 12.5. Subtle accentuated activity along the tongue base is relatively symmetric. Hypermetabolic left level IB lymph node measures 0.5 cm in short axis on image 62/4 (formerly 0.3 cm) with maximum SUV 4.8 (formerly 4.7). Incidental CT findings: none CHEST:At the site of the left lower lobe nodule there is currently  more bandlike thickening measuring 0.8 by 2.1  cm, previously measuring up to 1.2 cm in thickness, with maximum SUV in this vicinity 1.9. Incidental CT findings: Coronary, aortic arch, and branch vessel atherosclerotic vascular disease. Old granulomatous disease. 5 mm subpleural nodule on image 32/8 not readily seen previously, merit surveillance but not appreciably hypermetabolic currently. This is below sensitive PET-CT size thresholds. ABDOMEN/PELVIS: Considerable physiologic activity in bowel. Small hypermetabolic left inguinal lymph nodes, the most notable being a 0.7 cm left inguinal node on image 204/4 (formerly 0.5 cm) with maximum SUV 6.4 (previously 0.6). Along the left perineum there is indistinctly marginated subcutaneous stranding measuring about 2.6 by 1.1 cm on image 221/4, maximum SUV 12.5. This was not present previously. Incidental CT findings: Mild right hydronephrosis due to a 7 mm right UPJ calculus. 2 mm right kidney upper pole nonobstructive renal calculus. Photopenic 1.2 cm hypodense lesion in the dome of the right hepatic lobe on image 109/4. Old granulomatous disease of the spleen. Aortoiliac atherosclerotic vascular disease. Prominent stool throughout the colon favors constipation. SKELETON: Foci of accentuated activity in bilateral ribs associated with old rib fractures. Widespread tiny lytic lesions in the skeleton are observed, many less than 3 mm in size. There are also larger lytic lesions, including a left inferior pubic ramus lesion measuring 1.2 by 0.5 cm on image 204/4 with maximum SUV 1.6 (formerly 2.3). A lytic lesion posteriorly in the right proximal femoral cortex measures 1.3 by 1.2 cm on image 222/4, with maximum SUV in this vicinity 2.7 which is similar to the unaffected contralateral side. A lytic lesion of the left T9 vertebral body measures 2.8 by 1.7 cm on image 111/4, with maximum SUV of 4.5, previously 4.7. There is some localized sclerosis in the left proximal humerus  with associated cortical thickening and maximum SUV 4.8, previously 3.5. Incidental CT findings: Postoperative findings in the right ankle. EXTREMITIES: Focus of right antecubital activity is thought to be injection related. Incidental CT findings: none IMPRESSION: 1. There widespread tiny lytic lesions compatible with multiple myeloma. Index larger lesions are generally similar to the prior exam, with low-grade activity such as the left T9 vertebral body lesion with maximum SUV 4.5. Is mild increase in the activity associated with a mildly sclerotic left proximal humeral lesion, maximum SUV 4.8 (previously 3.5). 2. At the site of the prior left lower lobe nodule is currently more bandlike thickening, with maximum SUV only 1.9, probably benign, continued surveillance of this region suggested. 3. There several small but hypermetabolic lymph nodes. This includes a left parapharyngeal space node measuring 1.0 cm with maximum SUV 12.3 (stable); a left level IB lymph node measuring 0.5 cm with maximum SUV 4.8 (slightly larger than prior); and a left inguinal lymph node measuring 0.7 cm in short axis with maximum SUV 6.4 (previously 0.5 cm with maximum SUV 0.6). Significance of these lymph nodes uncertain, surveillance is recommended. 4. New 5 mm left lower lobe subpleural nodule on image 32/8, not appreciably hypermetabolic, surveillance suggested. 5. Focal subcutaneous stranding along the left perineum measuring about 2.6 by 1.1 cm on image 221/4, maximum SUV 12.5. This was not present previously and is most likely inflammatory, although given the notable SUV, surveillance of this region is suggested. 6. Other imaging findings of potential clinical significance: Aortic Atherosclerosis (ICD10-I70.0). Coronary atherosclerosis. Old granulomatous disease. Mild right hydronephrosis due to a 7 mm right UPJ calculus. 2 mm right kidney upper pole nonobstructive renal calculus. Prominent stool throughout the colon favors  constipation. Electronically Signed   By: Cindra Eves.D.  On: 09/14/2019 14:32    Labs:  CBC: Recent Labs    08/28/19 1250 09/04/19 1220 09/11/19 1300 09/18/19 0930  WBC 6.3 7.1 12.3* 8.3  HGB 12.3 12.9 13.5 13.2  HCT 36.5 38.6 40.3 39.9  PLT 174 210 175 201    COAGS: Recent Labs    01/06/19 0757 02/20/19 1257 05/30/19 0931  INR 0.94 1.0 1.0  APTT 27  --  25    BMP: Recent Labs    07/27/19 1046 08/28/19 1250 09/04/19 1220 09/11/19 1300  NA 138 139 136 134*  K 3.4* 3.2* 4.1 3.5  CL 105 102 105 100  CO2 '22 26 24 22  ' GLUCOSE 185* 206* 203* 290*  BUN '11 9 13 13  ' CALCIUM 9.4 9.4 9.6 9.7  CREATININE 0.72 0.73 0.75 0.89  GFRNONAA >60 >60 >60 >60  GFRAA >60 >60 >60 >60    LIVER FUNCTION TESTS: Recent Labs    07/27/19 1046 08/28/19 1250 09/04/19 1220 09/11/19 1300  BILITOT 0.7 0.7 0.5 0.7  AST 16 20 13* 10*  ALT '20 21 16 12  ' ALKPHOS 73 80 87 86  PROT 6.1* 6.8 6.5 6.4*  ALBUMIN 3.2* 3.1* 3.1* 3.2*    TUMOR MARKERS: No results for input(s): AFPTM, CEA, CA199, CHROMGRNA in the last 8760 hours.  Assessment and Plan: Patient with past medical history of multiple myeloma presents for repeat bone marrow biopsy for treatment surveillance.   Case reviewed by Dr. Irene Limbo who approves patient for procedure.  Patient presents today in their usual state of health.  She has been NPO and is not currently on blood thinners.   Risks and benefits of biopsy was discussed with the patient and/or patient's family including, but not limited to bleeding, infection, damage to adjacent structures or low yield requiring additional tests.  All of the questions were answered and there is agreement to proceed.  Consent signed and in chart.  Thank you for this interesting consult.  I greatly enjoyed meeting LATONDA LARRIVEE and look forward to participating in their care.  A copy of this report was sent to the requesting provider on this date.  Electronically Signed:  Docia Barrier, PA 09/18/2019, 10:41 AM   I spent a total of    25 Minutes in face to face in clinical consultation, greater than 50% of which was counseling/coordinating care for multiple myeloma.

## 2019-09-18 NOTE — Procedures (Signed)
Pre-procedure Diagnosis: Multiple myeloma Post-procedure Diagnosis: Same  Technically successful CT guided bone marrow aspiration and biopsy of left iliac crest.   Complications: None Immediate  EBL: None  Signed: Sandi Mariscal Pager: [phone] 09/18/2019, 11:36 AM

## 2019-09-18 NOTE — Discharge Instructions (Signed)
Bone Marrow Aspiration and Bone Marrow Biopsy, Adult, Care After °This sheet gives you information about how to care for yourself after your procedure. Your health care provider may also give you more specific instructions. If you have problems or questions, contact your health care provider. °What can I expect after the procedure? °After the procedure, it is common to have: °· Mild pain and tenderness. °· Swelling. °· Bruising. °Follow these instructions at home: °Puncture site care ° °  ° °· Follow instructions from your health care provider about how to take care of the puncture site. Make sure you: °? Wash your hands with soap and water before you change your bandage (dressing). If soap and water are not available, use hand sanitizer. °? Change your dressing as told by your health care provider. °· Check your puncture site every day for signs of infection. Check for: °? More redness, swelling, or pain. °? More fluid or blood. °? Warmth. °? Pus or a bad smell. °General instructions °· Take over-the-counter and prescription medicines only as told by your health care provider. °· Do not take baths, swim, or use a hot tub until your health care provider approves. Ask if you can take a shower or have a sponge bath. °· Return to your normal activities as told by your health care provider. Ask your health care provider what activities are safe for you. °· Do not drive for 24 hours if you were given a medicine to help you relax (sedative) during your procedure. °· Keep all follow-up visits as told by your health care provider. This is important. °Contact a health care provider if: °· Your pain is not controlled with medicine. °Get help right away if: °· You have a fever. °· You have more redness, swelling, or pain around the puncture site. °· You have more fluid or blood coming from the puncture site. °· Your puncture site feels warm to the touch. °· You have pus or a bad smell coming from the puncture site. °These  symptoms may represent a serious problem that is an emergency. Do not wait to see if the symptoms will go away. Get medical help right away. Call your local emergency services (911 in the [place] Do not drive yourself to the hospital. °Summary °· After the procedure, it is common to have mild pain, tenderness, swelling, and bruising. °· Follow instructions from your health care provider about how to take care of the puncture site. °· Get help right away if you have any symptoms of infection or if you have more blood or fluid coming from the puncture site. °This information is not intended to replace advice given to you by your health care provider. Make sure you discuss any questions you have with your health care provider. °Document Released: 06/19/2005 Document Revised: 03/15/2018 Document Reviewed: 05/13/2016 °Elsevier Patient Education © 2020 [employer] ° ° ° ° °Moderate Conscious Sedation, Adult, Care After °These instructions provide you with information about caring for yourself after your procedure. Your health care provider may also give you more specific instructions. Your treatment has been planned according to current medical practices, but problems sometimes occur. Call your health care provider if you have any problems or questions after your procedure. °What can I expect after the procedure? °After your procedure, it is common: °· To feel sleepy for several hours. °· To feel clumsy and have poor balance for several hours. °· To have poor judgment for several hours. °· To vomit if you eat too soon. °  Follow these instructions at home: °For at least 24 hours after the procedure: ° °· Do not: °? Participate in activities where you could fall or become injured. °? Drive. °? Use heavy machinery. °? Drink alcohol. °? Take sleeping pills or medicines that cause drowsiness. °? Make important decisions or sign legal documents. °? Take care of children on your own. °· Rest. °Eating and drinking °· Follow the  diet recommended by your health care provider. °· If you vomit: °? Drink water, juice, or soup when you can drink without vomiting. °? Make sure you have little or no nausea before eating solid foods. °General instructions °· Have a responsible adult stay with you until you are awake and alert. °· Take over-the-counter and prescription medicines only as told by your health care provider. °· If you smoke, do not smoke without supervision. °· Keep all follow-up visits as told by your health care provider. This is important. °Contact a health care provider if: °· You keep feeling nauseous or you keep vomiting. °· You feel light-headed. °· You develop a rash. °· You have a fever. °Get help right away if: °· You have trouble breathing. °This information is not intended to replace advice given to you by your health care provider. Make sure you discuss any questions you have with your health care provider. °Document Released: 09/20/2013 Document Revised: 11/12/2017 Document Reviewed: 03/21/2016 °Elsevier Patient Education © 2020 [employer] ° °

## 2019-09-19 ENCOUNTER — Encounter: Payer: Self-pay | Admitting: Hematology

## 2019-09-19 NOTE — Anesthesia Preprocedure Evaluation (Addendum)
Anesthesia Evaluation  Patient identified by MRN, date of birth, ID band Patient awake    Reviewed: Allergy & Precautions, NPO status , Patient's Chart, lab work & pertinent test results  Airway Mallampati: II  TM Distance: >3 FB Neck ROM: Full    Dental no notable dental hx. (+) Teeth Intact, Dental Advisory Given   Pulmonary asthma ,    Pulmonary exam normal breath sounds clear to auscultation       Cardiovascular negative cardio ROS Normal cardiovascular exam Rhythm:Regular Rate:Normal     Neuro/Psych neg Seizures PSYCHIATRIC DISORDERS Depression negative neurological ROS     GI/Hepatic negative GI ROS, (+)     substance abuse  ,   Endo/Other  diabetes, Oral Hypoglycemic Agentsmultiple myeloma on chemo  Renal/GU negative Renal ROS     Musculoskeletal negative musculoskeletal ROS (+) narcotic dependent  Abdominal (+) + obese,   Peds  Hematology HLD   Anesthesia Other Findings RIGHT RENAL STONE  Reproductive/Obstetrics                           Anesthesia Physical Anesthesia Plan  ASA: III  Anesthesia Plan: General   Post-op Pain Management:    Induction: Intravenous  PONV Risk Score and Plan: 3 and Ondansetron, Dexamethasone and Treatment may vary due to age or medical condition  Airway Management Planned: LMA  Additional Equipment:   Intra-op Plan:   Post-operative Plan: Extubation in OR  Informed Consent: I have reviewed the patients History and Physical, chart, labs and discussed the procedure including the risks, benefits and alternatives for the proposed anesthesia with the patient or authorized representative who has indicated his/her understanding and acceptance.     Dental advisory given  Plan Discussed with: CRNA  Anesthesia Plan Comments:        Anesthesia Quick Evaluation

## 2019-09-20 ENCOUNTER — Ambulatory Visit (HOSPITAL_BASED_OUTPATIENT_CLINIC_OR_DEPARTMENT_OTHER)
Admission: RE | Admit: 2019-09-20 | Discharge: 2019-09-20 | Disposition: A | Discharge status: Home / Self Care | Payer: Medicare Other | Attending: Urology | Admitting: Urology

## 2019-09-20 ENCOUNTER — Ambulatory Visit (HOSPITAL_BASED_OUTPATIENT_CLINIC_OR_DEPARTMENT_OTHER): Payer: Medicare Other | Admitting: Physician Assistant

## 2019-09-20 ENCOUNTER — Encounter (HOSPITAL_BASED_OUTPATIENT_CLINIC_OR_DEPARTMENT_OTHER)
Admission: RE | Disposition: A | Discharge status: Home / Self Care | Payer: Self-pay | Source: Home / Self Care | Attending: Urology

## 2019-09-20 ENCOUNTER — Encounter (HOSPITAL_BASED_OUTPATIENT_CLINIC_OR_DEPARTMENT_OTHER): Payer: Self-pay | Admitting: *Deleted

## 2019-09-20 DIAGNOSIS — Z79899 Other long term (current) drug therapy: Secondary | ICD-10-CM | POA: Diagnosis not present

## 2019-09-20 DIAGNOSIS — E785 Hyperlipidemia, unspecified: Secondary | ICD-10-CM | POA: Insufficient documentation

## 2019-09-20 DIAGNOSIS — E119 Type 2 diabetes mellitus without complications: Secondary | ICD-10-CM | POA: Insufficient documentation

## 2019-09-20 DIAGNOSIS — N201 Calculus of ureter: Secondary | ICD-10-CM | POA: Diagnosis not present

## 2019-09-20 DIAGNOSIS — Z7984 Long term (current) use of oral hypoglycemic drugs: Secondary | ICD-10-CM | POA: Insufficient documentation

## 2019-09-20 DIAGNOSIS — F329 Major depressive disorder, single episode, unspecified: Secondary | ICD-10-CM | POA: Diagnosis not present

## 2019-09-20 DIAGNOSIS — Z7982 Long term (current) use of aspirin: Secondary | ICD-10-CM | POA: Insufficient documentation

## 2019-09-20 DIAGNOSIS — C9000 Multiple myeloma not having achieved remission: Secondary | ICD-10-CM | POA: Diagnosis not present

## 2019-09-20 HISTORY — PX: CYSTOSCOPY/URETEROSCOPY/HOLMIUM LASER/STENT PLACEMENT: SHX6546

## 2019-09-20 LAB — SURGICAL PATHOLOGY

## 2019-09-20 LAB — POCT I-STAT, CHEM 8
BUN: 12 mg/dL (ref 8–23)
Calcium, Ion: 1.33 mmol/L (ref 1.15–1.40)
Chloride: 107 mmol/L (ref 98–111)
Creatinine, Ser: 0.6 mg/dL (ref 0.44–1.00)
Glucose, Bld: 181 mg/dL — ABNORMAL HIGH (ref 70–99)
HCT: 38 % (ref 36.0–46.0)
Hemoglobin: 12.9 g/dL (ref 12.0–15.0)
Potassium: 3.5 mmol/L (ref 3.5–5.1)
Sodium: 139 mmol/L (ref 135–145)
TCO2: 19 mmol/L — ABNORMAL LOW (ref 22–32)

## 2019-09-20 LAB — GLUCOSE, CAPILLARY: Glucose-Capillary: 134 mg/dL — ABNORMAL HIGH (ref 70–99)

## 2019-09-20 SURGERY — CYSTOSCOPY/URETEROSCOPY/HOLMIUM LASER/STENT PLACEMENT
Anesthesia: General | Site: Ureter | Laterality: Right

## 2019-09-20 MED ORDER — OXYCODONE HCL 5 MG PO TABS
ORAL_TABLET | ORAL | Status: AC
  Filled 2019-09-20: qty 1

## 2019-09-20 MED ORDER — CIPROFLOXACIN IN D5W 400 MG/200ML IV SOLN
INTRAVENOUS | Status: AC
  Filled 2019-09-20: qty 200

## 2019-09-20 MED ORDER — PROPOFOL 10 MG/ML IV BOLUS
INTRAVENOUS | Status: AC
  Filled 2019-09-20: qty 40

## 2019-09-20 MED ORDER — DEXAMETHASONE SODIUM PHOSPHATE 10 MG/ML IJ SOLN
INTRAMUSCULAR | Status: DC | PRN
  Administered 2019-09-20: 5 mg via INTRAVENOUS

## 2019-09-20 MED ORDER — CIPROFLOXACIN IN D5W 400 MG/200ML IV SOLN
400.0000 mg | INTRAVENOUS | Status: AC
  Administered 2019-09-20: 400 mg via INTRAVENOUS
  Filled 2019-09-20: qty 200

## 2019-09-20 MED ORDER — HYDROCODONE-ACETAMINOPHEN 5-325 MG PO TABS: 1.0000 | ORAL_TABLET | ORAL | 0 refills | Status: DC | PRN

## 2019-09-20 MED ORDER — FENTANYL CITRATE (PF) 100 MCG/2ML IJ SOLN
25.0000 ug | INTRAMUSCULAR | Status: DC | PRN
  Filled 2019-09-20: qty 1

## 2019-09-20 MED ORDER — ACETAMINOPHEN 325 MG PO TABS
650.0000 mg | ORAL_TABLET | Freq: Once | ORAL | Status: AC
  Administered 2019-09-20: 07:00:00 650 mg via ORAL
  Filled 2019-09-20: qty 2

## 2019-09-20 MED ORDER — DEXAMETHASONE SODIUM PHOSPHATE 10 MG/ML IJ SOLN
INTRAMUSCULAR | Status: AC
  Filled 2019-09-20: qty 1

## 2019-09-20 MED ORDER — SODIUM CHLORIDE 0.9 % IR SOLN
Status: DC | PRN
  Administered 2019-09-20: 3000 mL

## 2019-09-20 MED ORDER — OXYCODONE HCL 5 MG PO TABS
5.0000 mg | ORAL_TABLET | Freq: Once | ORAL | Status: DC
  Filled 2019-09-20: qty 1

## 2019-09-20 MED ORDER — ONDANSETRON HCL 4 MG/2ML IJ SOLN
4.0000 mg | Freq: Once | INTRAMUSCULAR | Status: DC | PRN
  Filled 2019-09-20: qty 2

## 2019-09-20 MED ORDER — IOHEXOL 300 MG/ML  SOLN
INTRAMUSCULAR | Status: DC | PRN
  Administered 2019-09-20: 7 mL

## 2019-09-20 MED ORDER — FENTANYL CITRATE (PF) 100 MCG/2ML IJ SOLN
INTRAMUSCULAR | Status: DC | PRN
  Administered 2019-09-20: 50 ug via INTRAVENOUS

## 2019-09-20 MED ORDER — ACETAMINOPHEN 325 MG PO TABS
ORAL_TABLET | ORAL | Status: AC
  Filled 2019-09-20: qty 2

## 2019-09-20 MED ORDER — LIDOCAINE 2% (20 MG/ML) 5 ML SYRINGE
INTRAMUSCULAR | Status: AC
  Filled 2019-09-20: qty 5

## 2019-09-20 MED ORDER — LIDOCAINE 2% (20 MG/ML) 5 ML SYRINGE
INTRAMUSCULAR | Status: DC | PRN
  Administered 2019-09-20: 60 mg via INTRAVENOUS

## 2019-09-20 MED ORDER — ONDANSETRON HCL 4 MG/2ML IJ SOLN
INTRAMUSCULAR | Status: AC
  Filled 2019-09-20: qty 2

## 2019-09-20 MED ORDER — LACTATED RINGERS IV SOLN
INTRAVENOUS | Status: DC
  Administered 2019-09-20 (×2): via INTRAVENOUS
  Filled 2019-09-20: qty 1000

## 2019-09-20 MED ORDER — FENTANYL CITRATE (PF) 100 MCG/2ML IJ SOLN
INTRAMUSCULAR | Status: AC
  Filled 2019-09-20: qty 2

## 2019-09-20 MED ORDER — ARTIFICIAL TEARS OPHTHALMIC OINT
TOPICAL_OINTMENT | OPHTHALMIC | Status: AC
  Filled 2019-09-20: qty 3.5

## 2019-09-20 MED ORDER — PROPOFOL 10 MG/ML IV BOLUS
INTRAVENOUS | Status: DC | PRN
  Administered 2019-09-20: 200 mg via INTRAVENOUS

## 2019-09-20 MED ORDER — MIDAZOLAM HCL 2 MG/2ML IJ SOLN
INTRAMUSCULAR | Status: DC | PRN
  Administered 2019-09-20: 0.5 mg via INTRAVENOUS

## 2019-09-20 MED ORDER — MIDAZOLAM HCL 2 MG/2ML IJ SOLN
INTRAMUSCULAR | Status: AC
  Filled 2019-09-20: qty 2

## 2019-09-20 SURGICAL SUPPLY — 28 items
BAG DRAIN URO-CYSTO SKYTR STRL (DRAIN) ×3 IMPLANT
CATH INTERMIT  6FR 70CM (CATHETERS) IMPLANT
CLOTH BEACON ORANGE TIMEOUT ST (SAFETY) ×3 IMPLANT
FIBER LASER FLEXIVA 365 (UROLOGICAL SUPPLIES) IMPLANT
FIBER LASER TRAC TIP (UROLOGICAL SUPPLIES) ×3 IMPLANT
GLOVE BIO SURGEON STRL SZ7 (GLOVE) ×3 IMPLANT
GLOVE BIO SURGEON STRL SZ7.5 (GLOVE) ×6 IMPLANT
GLOVE BIOGEL PI IND STRL 7.5 (GLOVE) ×1 IMPLANT
GLOVE BIOGEL PI INDICATOR 7.5 (GLOVE) ×2
GLOVE ECLIPSE 7.0 STRL STRAW (GLOVE) ×6 IMPLANT
GOWN STRL REUS W/ TWL LRG LVL3 (GOWN DISPOSABLE) ×2 IMPLANT
GOWN STRL REUS W/TWL LRG LVL3 (GOWN DISPOSABLE) ×4
GOWN STRL REUS W/TWL XL LVL3 (GOWN DISPOSABLE) ×3 IMPLANT
GUIDEWIRE ANG ZIPWIRE 038X150 (WIRE) ×3 IMPLANT
GUIDEWIRE STR DUAL SENSOR (WIRE) ×3 IMPLANT
IV NS 1000ML (IV SOLUTION)
IV NS 1000ML BAXH (IV SOLUTION) IMPLANT
IV NS IRRIG 3000ML ARTHROMATIC (IV SOLUTION) ×3 IMPLANT
KIT TURNOVER CYSTO (KITS) ×3 IMPLANT
MANIFOLD NEPTUNE II (INSTRUMENTS) ×3 IMPLANT
NS IRRIG 500ML POUR BTL (IV SOLUTION) ×3 IMPLANT
PACK CYSTO (CUSTOM PROCEDURE TRAY) ×3 IMPLANT
SHEATH URETERAL 12FRX35CM (MISCELLANEOUS) ×3 IMPLANT
STENT URET 6FRX24 CONTOUR (STENTS) ×3 IMPLANT
SYR 10ML LL (SYRINGE) ×3 IMPLANT
TUBE CONNECTING 12'X1/4 (SUCTIONS) ×1
TUBE CONNECTING 12X1/4 (SUCTIONS) ×2 IMPLANT
TUBING UROLOGY SET (TUBING) ×3 IMPLANT

## 2019-09-20 NOTE — H&P (Signed)
H&P  Chief Complaint: Right ureteral calculus  History of Present Illness: 78 year old female with a right ureteral calculus presents for ureteroscopy with laser lithotripsy and ureteral stent placement.  Past Medical History:  Diagnosis Date  . Allergy    seasonal  . Asthma   . DEPRESSION   . DIABETES MELLITUS, TYPE II   . Diverticulosis   . HYPERLIPIDEMIA   . Macular degeneration of left eye    mild, Dr.Hecker  . Obesity, unspecified   . Osteoarthritis of both knees   . OSTEOPENIA   . Osteopenia   . URINARY INCONTINENCE    Past Surgical History:  Procedure Laterality Date  . CATARACT EXTRACTION Left 05/24/2018  . CESAREAN SECTION  01/1973  . FRACTURE SURGERY    . IR IMAGING GUIDED PORT INSERTION  02/20/2019  . left wrist surgery  2008   By Dr. Latanya Maudlin  . right ankle  1994    Home Medications:  Medications Prior to Admission  Medication Sig Dispense Refill Last Dose  . acyclovir (ZOVIRAX) 400 MG tablet TAKE 1 TABLET(400 MG) BY MOUTH TWICE DAILY 60 tablet 0 09/19/2019 at Unknown time  . aspirin EC 81 MG tablet Take 81 mg by mouth daily after breakfast.    09/19/2019 at Unknown time  . Blood Glucose Monitoring Suppl (FREESTYLE FREEDOM LITE) W/DEVICE KIT Use to check blood sugars twice a day Dx 250.00 1 each 0 09/19/2019 at Unknown time  . Calcium Carbonate-Vitamin D (CALCIUM 600+D HIGH POTENCY) 600-400 MG-UNIT per tablet Take 1 tablet by mouth 2 (two) times daily.    09/19/2019 at Unknown time  . Cetirizine HCl 10 MG CAPS Take 1 capsule (10 mg total) by mouth daily. 30 capsule 1 09/19/2019 at Unknown time  . Continuous Blood Gluc Sensor (FREESTYLE LIBRE 14 DAY SENSOR) MISC 1 each by Does not apply route every 14 (fourteen) days. 2 each 11 09/19/2019 at Unknown time  . dexamethasone (DECADRON) 4 MG tablet Take 5 tablets (20 mg total) by mouth once a week. On D22 of each cycle of treatment 20 tablet 5 Past Month at Unknown time  . DOK 100 MG capsule TAKE 2 CAPSULES BY MOUTH AT  BEDTIME 60 capsule 1 09/19/2019 at Unknown time  . fentaNYL (DURAGESIC) 12 MCG/HR Place 1 patch onto the skin every 3 (three) days. 10 patch 0 09/20/2019 at Unknown time  . glipiZIDE (GLUCOTROL XL) 5 MG 24 hr tablet TAKE 1 TABLET(5 MG) BY MOUTH DAILY WITH BREAKFAST 90 tablet 1 09/19/2019 at Unknown time  . glucose blood (FREESTYLE LITE) test strip CHECK BLOOD SUGAR TWICE DAILY AS DIRECTED Dx 250.00 180 each 3 09/19/2019 at Unknown time  . Lancets (FREESTYLE) lancets Use twice daily to check sugars. 100 each 11 09/19/2019 at Unknown time  . lidocaine-prilocaine (EMLA) cream APPLY 1 APPLICATION TO THE AFFECTED AREA AS NEEDED. USE PRIOR TO PORT ACCESS 30 g 0 Past Week at Unknown time  . LORazepam (ATIVAN) 0.5 MG tablet Take 1 tablet (0.5 mg total) by mouth every 8 (eight) hours as needed for anxiety (significant essential tremors). 60 tablet 0   . metFORMIN (GLUCOPHAGE-XR) 500 MG 24 hr tablet TAKE 3 TABLETS(1500 MG) BY MOUTH DAILY WITH BREAKFAST 270 tablet 1 09/19/2019 at Unknown time  . Multiple Vitamins-Minerals (ICAPS) CAPS Take 1 capsule by mouth daily after breakfast.    09/19/2019 at Unknown time  . ondansetron (ZOFRAN) 8 MG tablet Take 1 tablet (8 mg total) by mouth 2 (two) times daily as needed (Nausea or vomiting).  30 tablet 1 Past Month at Unknown time  . Oxycodone HCl 10 MG TABS Take 1 tablet (10 mg total) by mouth every 6 (six) hours as needed. 90 tablet 0 09/19/2019 at Unknown time  . prochlorperazine (COMPAZINE) 10 MG tablet Take 1 tablet (10 mg total) by mouth every 6 (six) hours as needed (Nausea or vomiting). 30 tablet 1   . REVLIMID 15 MG capsule TAKE 1 CAPSULE BY MOUTH  DAILY TAKE FOR 21 DAYS ON,  7 DAYS OFF, REPEAT EVERY 28 DAYS 21 capsule 0 09/19/2019 at Unknown time  . sertraline (ZOLOFT) 50 MG tablet TAKE 1 TABLET BY MOUTH DAILY 90 tablet 1 09/19/2019 at Unknown time  . simvastatin (ZOCOR) 20 MG tablet TAKE 1 TABLET(20 MG) BY MOUTH DAILY 90 tablet 1 09/19/2019 at Unknown time  . Vitamin D,  Ergocalciferol, (DRISDOL) 1.25 MG (50000 UT) CAPS capsule TAKE 1 CAPSULE BY MOUTH EVERY 7 DAYS 12 capsule 0 Past Week at Unknown time  . fexofenadine (ALLEGRA) 60 MG tablet Take 1 tablet (60 mg total) by mouth 2 (two) times daily. 60 tablet 1 Unknown at Unknown time  . fluticasone (FLONASE) 50 MCG/ACT nasal spray Place 1 spray into both nostrils daily. (Patient taking differently: Place 1 spray into both nostrils daily as needed for allergies or rhinitis. ) 16 g 2 More than a month at Unknown time  . pantoprazole (PROTONIX) 20 MG tablet TAKE 1 TABLET(20 MG) BY MOUTH DAILY 30 tablet 5 More than a month at Unknown time  . polyethylene glycol (MIRALAX / GLYCOLAX) packet Take 17 g by mouth daily after breakfast.    More than a month at Unknown time  . Triamcinolone Acetonide 0.025 % LOTN Apply 1 application topically 3 (three) times daily as needed (rash/itching). 60 mL 2 More than a month at Unknown time   Allergies:  Allergies  Allergen Reactions  . Penicillins Anaphylaxis    "serum sickness"  . Aleve [Naproxen Sodium] Swelling    Swelling of face  . Sulfonamide Derivatives Rash    Family History  Problem Relation Age of Onset  . Diabetes Father   . Hyperlipidemia Father   . Heart disease Father   . Cancer Father   . Hypertension Father   . Colon cancer Paternal Grandmother 70  . Osteoporosis Mother   . Protein S deficiency Mother   . Hyperlipidemia Mother   . Multiple sclerosis Daughter   . Cancer Other        bladder  . Breast cancer Neg Hx    Social History:  reports that she has never smoked. She has never used smokeless tobacco. She reports that she does not drink alcohol or use drugs.  ROS: A complete review of systems was performed.  All systems are negative except for pertinent findings as noted. ROS   Physical Exam:  Vital signs in last 24 hours: Temp:  [98.2 F (36.8 C)] 98.2 F (36.8 C) (10/07 0650) Pulse Rate:  [92] 92 (10/07 0650) Resp:  [16] 16 (10/07  0650) BP: (137)/(71) 137/71 (10/07 0650) SpO2:  [98 %] 98 % (10/07 0650) Weight:  [76.9 kg] 76.9 kg (10/07 0650) General:  Alert and oriented, No acute distress HEENT: Normocephalic, atraumatic Neck: No JVD or lymphadenopathy Cardiovascular: Regular rate and rhythm Lungs: Regular rate and effort Abdomen: Soft, nontender, nondistended, no abdominal masses Back: No CVA tenderness Extremities: No edema Neurologic: Grossly intact  Laboratory Data:  Results for orders placed or performed during the hospital encounter of 09/20/19 (from  the past 24 hour(s))  I-STAT, chem 8     Status: Abnormal   Collection Time: 09/20/19  7:14 AM  Result Value Ref Range   Sodium 139 135 - 145 mmol/L   Potassium 3.5 3.5 - 5.1 mmol/L   Chloride 107 98 - 111 mmol/L   BUN 12 8 - 23 mg/dL   Creatinine, Ser 0.60 0.44 - 1.00 mg/dL   Glucose, Bld 181 (H) 70 - 99 mg/dL   Calcium, Ion 1.33 1.15 - 1.40 mmol/L   TCO2 19 (L) 22 - 32 mmol/L   Hemoglobin 12.9 12.0 - 15.0 g/dL   HCT 38.0 36.0 - 46.0 %   Recent Results (from the past 240 hour(s))  Novel Coronavirus, NAA (Hosp order, Send-out to Ref Lab; TAT 18-24 hrs     Status: None   Collection Time: 09/16/19 11:01 AM   Specimen: Nasopharyngeal Swab; Respiratory  Result Value Ref Range Status   SARS-CoV-2, NAA NOT DETECTED NOT DETECTED Final    Comment: (NOTE) This nucleic acid amplification test was developed and its performance characteristics determined by [employer]. Nucleic acid amplification tests include PCR and TMA. This test has not been FDA cleared or approved. This test has been authorized by FDA under an Emergency Use Authorization (EUA). This test is only authorized for the duration of time the declaration that circumstances exist justifying the authorization of the emergency use of in vitro diagnostic tests for detection of SARS-CoV-2 virus and/or diagnosis of COVID-19 infection under section 564(b)(1) of the Act, 21 U.S.C. 887NZV-7(K)  (1), unless the authorization is terminated or revoked sooner. When diagnostic testing is negative, the possibility of a false negative result should be considered in the context of a patient's recent exposures and the presence of clinical signs and symptoms consistent with COVID-19. An individual without symptoms of COVID- 19 and who is not shedding SARS-CoV-2 vi rus would expect to have a negative (not detected) result in this assay. Performed At: [facility] [address] [place], [place] [id number] Rush Farmer MD BP:[id number]    [place]  Final    Comment: Performed at [facility], [place] [address], [place], [place] [address]   Creatinine: Recent Labs    09/20/19 7473  CREATININE 0.60    Impression/Assessment:  Right ureteral calculus  Plan:  Proceed with surgery as planned  Marton Redwood, III 09/20/2019, 8:27 AM

## 2019-09-20 NOTE — Discharge Instructions (Signed)
[facility] °[phone] °Post Ureteroscopy With or Without Stent Instructions ° °Definitions: ° °Ureter: The duct that transports urine from the kidney to the bladder. °Stent:   A plastic hollow tube that is placed into the ureter, from the kidney to the                 bladder to prevent the ureter from swelling shut. ° °GENERAL INSTRUCTIONS: ° °Despite the fact that no skin incisions were used, the area around the ureter and bladder is raw and irritated. The stent is a foreign body which will further irritate the bladder wall. This irritation is manifested by increased frequency of urination, both day and night, and by an increase in the urge to urinate. In some, the urge to urinate is present almost always. Sometimes the urge is strong enough that you may not be able to stop yourself from urinating. The only real cure is to remove the stent and then give time for the bladder wall to heal which can't be done until the danger of the ureter swelling shut has passed, which varies. ° °You may see some blood in your urine while the stent is in place and a few days afterwards. Do not be alarmed, even if the urine was clear for a while. Get off your feet and drink lots of fluids until clearing occurs. If you start to pass clots or don't improve, call us. ° °DIET: °You may return to your normal diet immediately. Because of the raw surface of your bladder, alcohol, spicy foods, acid type foods and drinks with caffeine may cause irritation or frequency and should be used in moderation. To keep your urine flowing freely and to avoid constipation, drink plenty of fluids during the day ( 8-10 glasses ). °Tip: Avoid cranberry juice because it is very acidic. ° °ACTIVITY: °Your physical activity doesn't need to be restricted. However, if you are very active, you may see some blood in your urine. We suggest that you reduce your activity under these circumstances until the bleeding has stopped. ° °BOWELS: °It is  important to keep your bowels regular during the postoperative period. Straining with bowel movements can cause bleeding. A bowel movement every other day is reasonable. Use a mild laxative if needed, such as Milk of Magnesia 2-3 tablespoons, or 2 Dulcolax tablets. Call if you continue to have problems. If you have been taking narcotics for pain, before, during or after your surgery, you may be constipated. Take a laxative if necessary. ° ° °MEDICATION: °You should resume your pre-surgery medications unless told not to. °You may take oxybutynin or flomax if prescribed for bladder spasms or discomfort from the stent °Take pain medication as directed for pain refractory to conservative management ° °PROBLEMS YOU SHOULD REPORT TO US: °· Fevers over 100.5 Fahrenheit. °· Heavy bleeding, or clots ( See above notes about blood in urine ). °· Inability to urinate. °· Drug reactions ( hives, rash, nausea, vomiting, diarrhea ). °· Severe burning or pain with urination that is not improving. ° ° °Post Anesthesia Home Care Instructions ° °Activity: °Get plenty of rest for the remainder of the day. A responsible individual must stay with you for 24 hours following the procedure.  °For the next 24 hours, DO NOT: °-Drive a car °-Operate machinery °-Drink alcoholic beverages °-Take any medication unless instructed by your physician °-Make any legal decisions or sign important papers. ° °Meals: °Start with liquid foods such as gelatin or soup. Progress to regular foods   as tolerated. Avoid greasy, spicy, heavy foods. If nausea and/or vomiting occur, drink only clear liquids until the nausea and/or vomiting subsides. Call your physician if vomiting continues. ° °Special Instructions/Symptoms: °Your throat may feel dry or sore from the anesthesia or the breathing tube placed in your throat during surgery. If this causes discomfort, gargle with warm salt water. The discomfort should disappear within 24 hours. ° °If you had a scopolamine  patch placed behind your ear for the management of post- operative nausea and/or vomiting: ° °1. The medication in the patch is effective for 72 hours, after which it should be removed.  Wrap patch in a tissue and discard in the trash. Wash hands thoroughly with soap and water. °2. You may remove the patch earlier than 72 hours if you experience unpleasant side effects which may include dry mouth, dizziness or visual disturbances. °3. Avoid touching the patch. Wash your hands with soap and water after contact with the patch. °  ° ° °

## 2019-09-20 NOTE — Anesthesia Postprocedure Evaluation (Signed)
Anesthesia Post Note  Patient: Faith Orr  Procedure(s) Performed: CYSTOSCOPY/URETEROSCOPY/HOLMIUM LASER/STENT PLACEMENT (Right Ureter)     Patient location during evaluation: PACU Anesthesia Type: General Level of consciousness: awake and alert Pain management: pain level controlled Vital Signs Assessment: post-procedure vital signs reviewed and stable Respiratory status: spontaneous breathing, nonlabored ventilation, respiratory function stable and patient connected to nasal cannula oxygen Cardiovascular status: blood pressure returned to baseline and stable Postop Assessment: no apparent nausea or vomiting Anesthetic complications: no    Last Vitals:  Vitals:   09/20/19 1000 09/20/19 1122  BP: 133/75 (!) 142/74  Pulse: 81 79  Resp: 13 13  Temp:  36.8 C  SpO2: 95% 97%    Last Pain:  Vitals:   09/20/19 1115  TempSrc:   PainSc: 2                  Ryan P Ellender

## 2019-09-20 NOTE — Transfer of Care (Signed)
Immediate Anesthesia Transfer of Care Note  Patient: Faith Orr  Procedure(s) Performed: CYSTOSCOPY/URETEROSCOPY/HOLMIUM LASER/STENT PLACEMENT (Right Ureter)  Patient Location: PACU  Anesthesia Type:General  Level of Consciousness: sedated and patient cooperative  Airway & Oxygen Therapy: Patient Spontanous Breathing and Patient connected to nasal cannula oxygen  Post-op Assessment: Report given to RN and Post -op Vital signs reviewed and stable  Post vital signs: Reviewed and stable  Last Vitals:  Vitals Value Taken Time  BP 125/64 09/20/19 0912  Temp    Pulse 84 09/20/19 0914  Resp 10 09/20/19 0914  SpO2 97 % 09/20/19 0914  Vitals shown include unvalidated device data.  Last Pain:  Vitals:   09/20/19 0650  TempSrc: Oral  PainSc: 0-No pain      Patients Stated Pain Goal: 4 ([id number])  Complications: No apparent anesthesia complications

## 2019-09-20 NOTE — Op Note (Signed)
Operative Note  Preoperative diagnosis:  1.  Right ureteral calculus  Postoperative diagnosis: 1.  Right ureteral calculus  Procedure(s): 1.  Cystoscopy with right retrograde pyelogram, right ureteroscopy with laser lithotripsy, ureteral stent placement  Surgeon: Link Snuffer, MD  Assistants: None  Anesthesia: General  Complications: None immediate  EBL: Minimal  Specimens: 1.  None  Drains/Catheters: 1.  6 x 24 double-J ureteral stent  Intraoperative findings: 1.  Normal urethra and bladder 2.  Retrograde pyelogram revealed fullness of the renal pelvis.  Ureteroscopy revealed a 7 mm proximal ureteral calculus fragmented to tiny fragments  Indication: 78 year old female with a right ureteral calculus presents for the previously mentioned operation  Description of procedure:  The patient was identified and consent was obtained.  The patient was taken to the operating room and placed in the supine position.  The patient was placed under general anesthesia.  Perioperative antibiotics were administered.  The patient was placed in dorsal lithotomy.  Patient was prepped and draped in a standard sterile fashion and a timeout was performed.  A 21 French rigid cystoscope was advanced into the urethra and into the bladder.  Complete cystoscopy was performed with no abnormal findings.  The right ureter was cannulated with a sensor wire which was advanced up to the kidney under fluoroscopic guidance.  A second sensor wire was advanced up the ureter and into the kidney under fluoroscopic guidance.  The scope was withdrawn.  1 of the wires was secured as a safety wire and the other wire was used to advance a 12 x 14 ureteral access sheath over the wire under continuous fluoroscopic guidance.  The inner sheath along with the wire were withdrawn.  Flexible ureteroscopy was performed and the stone of interest was identified which was laser fragmented to tiny fragments.  I performed a complete  pyeloscopy and no other clinically significant stone fragments were seen.  I then shot a retrograde pyelogram through the scope with the findings noted above.  I withdrew the scope along with the access sheath visualizing the entire ureter upon removal.  No injury was noted.  No other ureteral calculi were noted.  I backloaded the wire onto a rigid cystoscope followed by routine placement of a 6 x 24 double-J ureteral stent.  Fluoroscopy confirmed proximal placement and direct visualization confirmed a good coil within the bladder.  I drained the bladder and withdrew the scope.  Patient tolerated procedure well was stable postoperatively.  Plan: Return in 1 week for stent removal

## 2019-09-20 NOTE — Anesthesia Procedure Notes (Signed)
Procedure Name: LMA Insertion Date/Time: 09/20/2019 8:40 AM Performed by: Wanita Chamberlain, CRNA Pre-anesthesia Checklist: Patient identified, Emergency Drugs available, Suction available and Patient being monitored Patient Re-evaluated:Patient Re-evaluated prior to induction Oxygen Delivery Method: Circle system utilized Preoxygenation: Pre-oxygenation with 100% oxygen Induction Type: IV induction Ventilation: Mask ventilation without difficulty LMA: LMA inserted LMA Size: 4.0 Number of attempts: 1 Placement Confirmation: breath sounds checked- equal and bilateral,  CO2 detector and positive ETCO2 Tube secured with: Tape Dental Injury: Teeth and Oropharynx as per pre-operative assessment

## 2019-09-21 ENCOUNTER — Encounter (HOSPITAL_COMMUNITY): Payer: Self-pay | Admitting: Hematology

## 2019-09-21 ENCOUNTER — Encounter (HOSPITAL_BASED_OUTPATIENT_CLINIC_OR_DEPARTMENT_OTHER): Payer: Self-pay | Admitting: Urology

## 2019-09-22 NOTE — Progress Notes (Signed)
HEMATOLOGY/ONCOLOGY CLINIC NOTE  Date of Service: 09/25/2019  Patient Care Team: Hoyt Koch, MD as PCP - General (Internal Medicine) Lafayette Dragon, MD (Inactive) as Consulting Physician (Gastroenterology) Megan Salon, MD as Consulting Physician (Gynecology) Melrose Nakayama, MD as Consulting Physician (Orthopedic Surgery) Deneise Lever, MD as Consulting Physician (Pulmonary Disease) [name], MD (Ophthalmology)  CHIEF COMPLAINTS/PURPOSE OF CONSULTATION:  Continue mx of myeloma  HISTORY OF PRESENTING ILLNESS:   Faith Orr is a wonderful 78 y.o. female who has been referred to [name] by Dr. Pricilla Holm for evaluation and management of Lytic bone lesions. She is accompanied today by her son in law, and her partner is present via phone. The pt reports that she is doing well overall.   The pt notes that 2-3 months ago while standing at the stove, and otherwise feeling normally, she felt "something snap that took her breath away" in her mid back as she stretched to get something. The pt notes that she saw a chiropractor twice due to her back stiffness, which did not help. The pt then described her new bone pains to her PCP on 12/05/18, and subsequent imaging, as noted below, revealed concerns for numerous bone lesions. She has begun 53m Fosamax. The pt notes that she had hip pain in 2018, and that an XR at that time did not reveal any lesions.  The pt notes that most of her pain is concentrated to her left shoulder presently, and with minimal movement of the arm. She endorses pain radiating into her left arm and notes that her hand is swollen in the mornings when she wakes up. She also endorses present back pain and right hip pain, worse when she walks. She has not yet seen orthopedics. The pt reports that she is needing to take 6033mAdvil every 4-6 hours as Tramadol alone has not been able to alleviate her pain. The pt denies any unexpected weight loss, fevers,  chills, or night sweats. The pt notes that her urine is a very dark color presently, but denies overt blood in the urine, underpants, nor tissue paper. The pt notes that in the last two weeks she has had some soreness in her head, but denies new headaches or changes in vision. The pt notes that she has been urinating more frequently overall, but has been trying to stay better hydrated as well.  The pt notes that she has been compliant with annual mammograms.   The pt notes that she had a cyst in her right breast which was removed in the past. She fractured her left wrist in 2008 after falling down stairs. The pt endorses history of fatty liver.   The pt denies ever smoking cigarettes and endorses significant second hand smoke exposure with a previous marriage.   Of note prior to the patient's visit today, pt has had a Bone Scan completed on 12/13/18 with results revealing Multifocal uptake throughout the skeleton, consistent with diffuse metastatic disease. Primary tumor is not specified. 2. Uptake in the proximal right femur, consistent with lytic lesions. 3. Uptake in the ribs bilaterally as described. 4. Lesions in the proximal left humerus. 5. Diffuse uptake throughout the skull consistent with metastatic disease. 6. Right paramedian uptake at the manubrium.  Most recent lab results (12/08/18) of CBC w/diff and CMP is as follows: all values are WNL except for Glucose at 279, BUN at 24, AST at 41, ALT at 46. 12/08/18 SPEP revealed all values WNL except for Total  Protein at 6.0, Albumin at 3.6, Gamma globulin at 0.7, and M spike at 0.5g  On review of systems, pt reports significant left shoulder pain, back pain, right hip pain, dark urine, and denies fevers, chills, night sweats, unexpected weight loss, changes in bowel habits, changes in breathing, cough, new respiratory symptoms, changes in vision, abdominal pains, leg swelling, and any other symptoms.   On PMHx the pt reports fatty liver, and  denies blood clots.  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 Denies ever smoking.  On Family Hx the pt reports maternal grandmother with colon cancer. Father with bladder cancer and amyloidosis (pt notes that this could have been misdiagnosis). Mother with Protein S deficiency and polymyalgia rheumatica.  Interval History:   Faith Orr returns today for management and evaluation of her newly diagnosed Multiple Myeloma and C9D1 of Carfilzomib, Revlimid, and Dexamethasone. The patient's last visit with [name] was on 08/28/2019. The pt reports that she is doing well overall.  The pt reports that she had a kidney stone removed on 10/07 and it went well. She notes that she has felt an increase in aches in her spine and across her shoulders. These aches appear to be more widespread and could be due to her inactivity. Pt had what she thought was a boil near her perineum but it drained and there was blood present. It has since improved but has not been using topical antibiotics. Pt endorses eating well and continuing to do things for herself. She also denies constipation and has been having regular bowel movements.  Of note since the patient's last visit, pt has had PET/CT Whole Body Scan ([id number]) completed on 09/14/2019 with results revealing "1. There widespread tiny lytic lesions compatible with multiple myeloma. Index larger lesions are generally similar to the prior exam, with low-grade activity such as the left T9 vertebral body lesion with maximum SUV 4.5. Is mild increase in the activity associated with a mildly sclerotic left proximal humeral lesion, maximum SUV 4.8 (previously 3.5). 2. At the site of the prior left lower lobe nodule is currently more bandlike thickening, with maximum SUV only 1.9, probably benign, continued surveillance of this region suggested. 3. There several small but hypermetabolic lymph nodes. This includes a left parapharyngeal  space node measuring 1.0 cm with maximum SUV 12.3 (stable); a left level IB lymph node measuring 0.5 cm with maximum SUV 4.8 (slightly larger than prior); and a left inguinal lymph node measuring 0.7 cm in short axis with maximum SUV 6.4 (previously 0.5 cm with maximum SUV 0.6). Significance of these lymph nodes uncertain, surveillance is recommended. 4. New 5 mm left lower lobe subpleural nodule on image 32/8, not appreciably hypermetabolic, surveillance suggested. 5. Focal subcutaneous stranding along the left perineum measuring about 2.6 by 1.1 cm on image 221/4, maximum SUV 12.5. This was not present previously and is most likely inflammatory, although given the notable SUV, surveillance of this region is suggested. 6. Other imaging findings of potential clinical significance: Aortic Atherosclerosis (ICD10-I70.0). Coronary atherosclerosis. Old granulomatous disease. Mild right hydronephrosis due to a 7 mm right UPJ calculus. 2 mm right kidney upper pole nonobstructive renal calculus. Prominent stool throughout the colon favors constipation."  Pt has had BM Bx Report completed on 09/18/2019 with results revealing "Slightly hypercellular bone marrow for age with trilineage hematopoiesis and 1% plasma cells."   Lab results today (09/25/19) of CBC w/diff and CMP is as follows: all values are  WNL except for Monos Abs at 1.3K, Abs Immature Granulocytes at 0.10K, Potassium at 3.4, CO2 at 20, Glucose at 244, Total Protein at 6.4, Albumin at 3.2, AST at 12.  On review of systems, pt reports aches in spine/across shoulders and denies fevers, chills, cough, cold, diarrhea, constipation and any other symptoms.   MEDICAL HISTORY:  Past Medical History:  Diagnosis Date   Allergy    seasonal   Asthma    DEPRESSION    DIABETES MELLITUS, TYPE II    Diverticulosis    HYPERLIPIDEMIA    Macular degeneration of left eye    mild, Dr.Hecker   Obesity, unspecified    Osteoarthritis of both knees     OSTEOPENIA    Osteopenia    URINARY INCONTINENCE     SURGICAL HISTORY: Past Surgical History:  Procedure Laterality Date   CATARACT EXTRACTION Left 05/24/2018   CESAREAN SECTION  01/1973   CYSTOSCOPY/URETEROSCOPY/HOLMIUM LASER/STENT PLACEMENT Right 09/20/2019   Procedure: CYSTOSCOPY/URETEROSCOPY/HOLMIUM LASER/STENT PLACEMENT;  Surgeon: Lucas Mallow, MD;  Location: [facility];  Service: Urology;  Laterality: Right;   FRACTURE SURGERY     IR IMAGING GUIDED PORT INSERTION  02/20/2019   left wrist surgery  2008   By Dr. Latanya Maudlin   right ankle  1994    SOCIAL HISTORY: Social History   Socioeconomic History   Marital status: Married    Spouse name: Not on file   Number of children: 1   Years of education: Not on file   Highest education level: Not on file  Occupational History    Employer: [employer] resource strain: Patient refused   Food insecurity    Worry: Patient refused    Inability: Patient refused   Transportation needs    Medical: Patient refused    Non-medical: Patient refused  Tobacco Use   Smoking status: Never Smoker   Smokeless tobacco: Never Used   Tobacco comment: Lives with partner [name]) and son  Substance and Sexual Activity   Alcohol use: No    Alcohol/week: 0.0 standard drinks   Drug use: No   Sexual activity: Never    Partners: Female    Birth control/protection: Post-menopausal    Comment: Lives with female partner (annette hicks) and 72 yo son  Lifestyle   Physical activity    Days per week: Patient refused    Minutes per session: Patient refused   Stress: Patient refused  Relationships   Social connections    Talks on phone: Patient refused    Gets together: Patient refused    Attends religious service: Patient refused    Active member of club or organization: Patient refused    Attends meetings of clubs or organizations: Patient refused     Relationship status: Patient refused   Intimate partner violence    Fear of current or ex partner: Patient refused    Emotionally abused: Patient refused    Physically abused: Patient refused    Forced sexual activity: Patient refused  Other Topics Concern   Not on file  Social History Narrative   Not on file    FAMILY HISTORY: Family History  Problem Relation Age of Onset   Diabetes Father    Hyperlipidemia Father    Heart disease Father    Cancer Father    Hypertension Father    Colon cancer Paternal Grandmother 35   Osteoporosis Mother    Protein S deficiency Mother  Hyperlipidemia Mother    Multiple sclerosis Daughter    Cancer Other        bladder   Breast cancer Neg Hx     ALLERGIES:  is allergic to penicillins; aleve [naproxen sodium]; and sulfonamide derivatives.  MEDICATIONS:  Current Outpatient Medications  Medication Sig Dispense Refill   acyclovir (ZOVIRAX) 400 MG tablet TAKE 1 TABLET(400 MG) BY MOUTH TWICE DAILY 60 tablet 0   aspirin EC 81 MG tablet Take 81 mg by mouth daily after breakfast.      Blood Glucose Monitoring Suppl (FREESTYLE FREEDOM LITE) W/DEVICE KIT Use to check blood sugars twice a day Dx 250.00 1 each 0   Calcium Carbonate-Vitamin D (CALCIUM 600+D HIGH POTENCY) 600-400 MG-UNIT per tablet Take 1 tablet by mouth 2 (two) times daily.      Cetirizine HCl 10 MG CAPS Take 1 capsule (10 mg total) by mouth daily. 30 capsule 1   dexamethasone (DECADRON) 4 MG tablet Take 5 tablets (20 mg total) by mouth once a week. On D22 of each cycle of treatment 20 tablet 5   DOK 100 MG capsule TAKE 2 CAPSULES BY MOUTH AT BEDTIME 60 capsule 1   fentaNYL (DURAGESIC) 12 MCG/HR Place 1 patch onto the skin every 3 (three) days. 10 patch 0   fluticasone (FLONASE) 50 MCG/ACT nasal spray Place 1 spray into both nostrils daily. (Patient taking differently: Place 1 spray into both nostrils daily as needed for allergies or rhinitis. ) 16 g 2    glipiZIDE (GLUCOTROL XL) 5 MG 24 hr tablet TAKE 1 TABLET(5 MG) BY MOUTH DAILY WITH BREAKFAST 90 tablet 1   glucose blood (FREESTYLE LITE) test strip CHECK BLOOD SUGAR TWICE DAILY AS DIRECTED Dx 250.00 180 each 3   Lancets (FREESTYLE) lancets Use twice daily to check sugars. 100 each 11   lidocaine-prilocaine (EMLA) cream APPLY 1 APPLICATION TO THE AFFECTED AREA AS NEEDED. USE PRIOR TO PORT ACCESS 30 g 0   metFORMIN (GLUCOPHAGE-XR) 500 MG 24 hr tablet TAKE 3 TABLETS(1500 MG) BY MOUTH DAILY WITH BREAKFAST (Patient taking differently: Take 1,000 mg by mouth daily. ) 270 tablet 1   Multiple Vitamins-Minerals (ICAPS) CAPS Take 1 capsule by mouth daily after breakfast.      ondansetron (ZOFRAN) 8 MG tablet Take 1 tablet (8 mg total) by mouth 2 (two) times daily as needed (Nausea or vomiting). 30 tablet 1   Oxycodone HCl 10 MG TABS Take 1 tablet (10 mg total) by mouth every 6 (six) hours as needed. 90 tablet 0   pantoprazole (PROTONIX) 20 MG tablet TAKE 1 TABLET(20 MG) BY MOUTH DAILY 30 tablet 5   polyethylene glycol (MIRALAX / GLYCOLAX) packet Take 17 g by mouth daily after breakfast.      prochlorperazine (COMPAZINE) 10 MG tablet Take 1 tablet (10 mg total) by mouth every 6 (six) hours as needed (Nausea or vomiting). 30 tablet 1   REVLIMID 15 MG capsule TAKE 1 CAPSULE BY MOUTH  DAILY TAKE FOR 21 DAYS ON,  7 DAYS OFF, REPEAT EVERY 28 DAYS 21 capsule 0   sertraline (ZOLOFT) 50 MG tablet TAKE 1 TABLET BY MOUTH DAILY 90 tablet 1   simvastatin (ZOCOR) 20 MG tablet TAKE 1 TABLET(20 MG) BY MOUTH DAILY 90 tablet 1   Triamcinolone Acetonide 0.025 % LOTN Apply 1 application topically 3 (three) times daily as needed (rash/itching). 60 mL 2   Vitamin D, Ergocalciferol, (DRISDOL) 1.25 MG (50000 UT) CAPS capsule TAKE 1 CAPSULE BY MOUTH EVERY 7 DAYS  12 capsule 0   LORazepam (ATIVAN) 0.5 MG tablet Take 1 tablet (0.5 mg total) by mouth every 8 (eight) hours as needed for anxiety (significant essential  tremors). (Patient not taking: Reported on 09/25/2019) 60 tablet 0   No current facility-administered medications for this visit.    Facility-Administered Medications Ordered in Other Visits  Medication Dose Route Frequency Provider Last Rate Last Dose   heparin lock flush 100 unit/mL  500 Units Intracatheter Once PRN Brunetta Genera, MD        REVIEW OF SYSTEMS:    A 10+ POINT REVIEW OF SYSTEMS WAS OBTAINED including neurology, dermatology, psychiatry, cardiac, respiratory, lymph, extremities, GI, GU, Musculoskeletal, constitutional, breasts, reproductive, HEENT.  All pertinent positives are noted in the HPI.  All others are negative.   PHYSICAL EXAMINATION: ECOG PERFORMANCE STATUS: 2 - Symptomatic, <50% confined to bed Exam was given in a motorchair   GENERAL:alert, in no acute distress and comfortable SKIN: no acute rashes, no significant lesions EYES: conjunctiva are pink and non-injected, sclera anicteric OROPHARYNX: MMM, no exudates, no oropharyngeal erythema or ulceration NECK: supple, no JVD LYMPH:  no palpable lymphadenopathy in the cervical, axillary or inguinal regions LUNGS: clear to auscultation b/l with normal respiratory effort HEART: regular rate & rhythm ABDOMEN:  normoactive bowel sounds , non tender, not distended. No palpable hepatosplenomegaly.  Extremity: no pedal edema PSYCH: alert & oriented x 3 with fluent speech NEURO: no focal motor/sensory deficits  LABORATORY DATA:  I have reviewed the data as listed  . CBC Latest Ref Rng & Units 09/25/2019 09/20/2019 09/18/2019  WBC 4.0 - 10.5 K/uL 7.4 - 8.3  Hemoglobin 12.0 - 15.0 g/dL 13.2 12.9 13.2  Hematocrit 36.0 - 46.0 % 39.1 38.0 39.9  Platelets 150 - 400 K/uL 226 - 201   . CBC    Component Value Date/Time   WBC 7.4 09/25/2019 1033   RBC 4.15 09/25/2019 1033   HGB 13.2 09/25/2019 1033   HGB 12.8 02/16/2019 1138   HCT 39.1 09/25/2019 1033   PLT 226 09/25/2019 1033   PLT 170 02/16/2019 1138    MCV 94.2 09/25/2019 1033   MCH 31.8 09/25/2019 1033   MCHC 33.8 09/25/2019 1033   RDW 14.6 09/25/2019 1033   LYMPHSABS 1.6 09/25/2019 1033   MONOABS 1.3 (H) 09/25/2019 1033   EOSABS 0.5 09/25/2019 1033   BASOSABS 0.1 09/25/2019 1033    . CMP Latest Ref Rng & Units 09/25/2019 09/20/2019 09/11/2019  Glucose 70 - 99 mg/dL 244(H) 181(H) 290(H)  BUN 8 - 23 mg/dL _0 Creatinine 0.44 - 1.00 mg/dL 0.81 0.60 0.89  Sodium 135 - 145 mmol/L 137 139 134(L)  Potassium 3.5 - 5.1 mmol/L 3.4(L) 3.5 3.5  Chloride 98 - 111 mmol/L 103 107 100  CO2 22 - 32 mmol/L 20(L) - 22  Calcium 8.9 - 10.3 mg/dL 9.5 - 9.7  Total Protein 6.5 - 8.1 g/dL 6.4(L) - 6.4(L)  Total Bilirubin 0.3 - 1.2 mg/dL 0.7 - 0.7  Alkaline Phos 38 - 126 U/L 80 - 86  AST 15 - 41 U/L 12(L) - 10(L)  ALT 0 - 44 U/L 17 - 12   09/18/2019 BM Bx Report ([id number])   09/18/2019 FISH Panel    05/30/2019 BM Bx   01/06/2019 BM Bx:     01/06/19 Cytogenetics:      05/30/19 BM Biopsy:   09/18/2019 FISH Panel    09/18/2019 BM Surgical Pathology [id number])     RADIOGRAPHIC STUDIES: I have  personally reviewed the radiological images as listed and agreed with the findings in the report. Nm Pet Image Restage (ps) Whole Body  Result Date: 09/14/2019 CLINICAL DATA:  Subsequent treatment strategy for multiple myeloma. EXAM: NUCLEAR MEDICINE PET WHOLE BODY TECHNIQUE: 8.5 mCi F-18 FDG was injected intravenously. Full-ring PET imaging was performed from the skull base to thigh after the radiotracer. CT data was obtained and used for attenuation correction and anatomic localization. Fasting blood glucose: 140 mg/dl COMPARISON:  Multiple exams, including 05/31/2019 FINDINGS: Mediastinal blood pool activity: SUV max 2.4 HEAD/NECK: Hypermetabolic nodule in the left parapharyngeal space measures 1.2 by 1.0 cm with maximum SUV 12.3, previously 1.3 by 1.1 cm with maximum SUV 12.5. Subtle accentuated activity along the tongue base  is relatively symmetric. Hypermetabolic left level IB lymph node measures 0.5 cm in short axis on image 62/4 (formerly 0.3 cm) with maximum SUV 4.8 (formerly 4.7). Incidental CT findings: none CHEST:At the site of the left lower lobe nodule there is currently more bandlike thickening measuring 0.8 by 2.1 cm, previously measuring up to 1.2 cm in thickness, with maximum SUV in this vicinity 1.9. Incidental CT findings: Coronary, aortic arch, and branch vessel atherosclerotic vascular disease. Old granulomatous disease. 5 mm subpleural nodule on image 32/8 not readily seen previously, merit surveillance but not appreciably hypermetabolic currently. This is below sensitive PET-CT size thresholds. ABDOMEN/PELVIS: Considerable physiologic activity in bowel. Small hypermetabolic left inguinal lymph nodes, the most notable being a 0.7 cm left inguinal node on image 204/4 (formerly 0.5 cm) with maximum SUV 6.4 (previously 0.6). Along the left perineum there is indistinctly marginated subcutaneous stranding measuring about 2.6 by 1.1 cm on image 221/4, maximum SUV 12.5. This was not present previously. Incidental CT findings: Mild right hydronephrosis due to a 7 mm right UPJ calculus. 2 mm right kidney upper pole nonobstructive renal calculus. Photopenic 1.2 cm hypodense lesion in the dome of the right hepatic lobe on image 109/4. Old granulomatous disease of the spleen. Aortoiliac atherosclerotic vascular disease. Prominent stool throughout the colon favors constipation. SKELETON: Foci of accentuated activity in bilateral ribs associated with old rib fractures. Widespread tiny lytic lesions in the skeleton are observed, many less than 3 mm in size. There are also larger lytic lesions, including a left inferior pubic ramus lesion measuring 1.2 by 0.5 cm on image 204/4 with maximum SUV 1.6 (formerly 2.3). A lytic lesion posteriorly in the right proximal femoral cortex measures 1.3 by 1.2 cm on image 222/4, with maximum SUV in  this vicinity 2.7 which is similar to the unaffected contralateral side. A lytic lesion of the left T9 vertebral body measures 2.8 by 1.7 cm on image 111/4, with maximum SUV of 4.5, previously 4.7. There is some localized sclerosis in the left proximal humerus with associated cortical thickening and maximum SUV 4.8, previously 3.5. Incidental CT findings: Postoperative findings in the right ankle. EXTREMITIES: Focus of right antecubital activity is thought to be injection related. Incidental CT findings: none IMPRESSION: 1. There widespread tiny lytic lesions compatible with multiple myeloma. Index larger lesions are generally similar to the prior exam, with low-grade activity such as the left T9 vertebral body lesion with maximum SUV 4.5. Is mild increase in the activity associated with a mildly sclerotic left proximal humeral lesion, maximum SUV 4.8 (previously 3.5). 2. At the site of the prior left lower lobe nodule is currently more bandlike thickening, with maximum SUV only 1.9, probably benign, continued surveillance of this region suggested. 3. There several small but  hypermetabolic lymph nodes. This includes a left parapharyngeal space node measuring 1.0 cm with maximum SUV 12.3 (stable); a left level IB lymph node measuring 0.5 cm with maximum SUV 4.8 (slightly larger than prior); and a left inguinal lymph node measuring 0.7 cm in short axis with maximum SUV 6.4 (previously 0.5 cm with maximum SUV 0.6). Significance of these lymph nodes uncertain, surveillance is recommended. 4. New 5 mm left lower lobe subpleural nodule on image 32/8, not appreciably hypermetabolic, surveillance suggested. 5. Focal subcutaneous stranding along the left perineum measuring about 2.6 by 1.1 cm on image 221/4, maximum SUV 12.5. This was not present previously and is most likely inflammatory, although given the notable SUV, surveillance of this region is suggested. 6. Other imaging findings of potential clinical significance:  Aortic Atherosclerosis (ICD10-I70.0). Coronary atherosclerosis. Old granulomatous disease. Mild right hydronephrosis due to a 7 mm right UPJ calculus. 2 mm right kidney upper pole nonobstructive renal calculus. Prominent stool throughout the colon favors constipation. Electronically Signed   By: Van Clines M.D.   On: 09/14/2019 14:32   Ct Biopsy  Result Date: 09/18/2019 INDICATION: History of multiple myeloma. Please perform CT-guided bone marrow biopsy to evaluate for response to treatment. EXAM: CT-GUIDED BONE MARROW BIOPSY AND ASPIRATION MEDICATIONS: None ANESTHESIA/SEDATION: Fentanyl 100 mcg IV; Versed 2 mg IV Sedation Time: 10 Minutes; The patient was continuously monitored during the procedure by the interventional radiology nurse under my direct supervision. COMPLICATIONS: None immediate. PROCEDURE: Informed consent was obtained from the patient following an explanation of the procedure, risks, benefits and alternatives. The patient understands, agrees and consents for the procedure. All questions were addressed. A time out was performed prior to the initiation of the procedure. The patient was positioned prone and non-contrast localization CT was performed of the pelvis to demonstrate the iliac marrow spaces. The operative site was prepped and draped in the usual sterile fashion. Under sterile conditions and local anesthesia, a 22 gauge spinal needle was utilized for procedural planning. Next, an 11 gauge coaxial bone biopsy needle was advanced into the left iliac marrow space. Needle position was confirmed with CT imaging. Initially, bone marrow aspiration was performed. Next, a bone marrow biopsy was obtained with the 11 gauge outer bone marrow device. Samples were prepared with the cytotechnologist and deemed adequate. The needle was removed intact. Hemostasis was obtained with compression and a dressing was placed. The patient tolerated the procedure well without immediate post procedural  complication. IMPRESSION: Successful CT guided left iliac bone marrow aspiration and core biopsy. Electronically Signed   By: Sandi Mariscal M.D.   On: 09/18/2019 12:35   Ct Bone Marrow Biopsy & Aspiration  Result Date: 09/18/2019 INDICATION: History of multiple myeloma. Please perform CT-guided bone marrow biopsy to evaluate for response to treatment. EXAM: CT-GUIDED BONE MARROW BIOPSY AND ASPIRATION MEDICATIONS: None ANESTHESIA/SEDATION: Fentanyl 100 mcg IV; Versed 2 mg IV Sedation Time: 10 Minutes; The patient was continuously monitored during the procedure by the interventional radiology nurse under my direct supervision. COMPLICATIONS: None immediate. PROCEDURE: Informed consent was obtained from the patient following an explanation of the procedure, risks, benefits and alternatives. The patient understands, agrees and consents for the procedure. All questions were addressed. A time out was performed prior to the initiation of the procedure. The patient was positioned prone and non-contrast localization CT was performed of the pelvis to demonstrate the iliac marrow spaces. The operative site was prepped and draped in the usual sterile fashion. Under sterile conditions and local anesthesia, a  22 gauge spinal needle was utilized for procedural planning. Next, an 11 gauge coaxial bone biopsy needle was advanced into the left iliac marrow space. Needle position was confirmed with CT imaging. Initially, bone marrow aspiration was performed. Next, a bone marrow biopsy was obtained with the 11 gauge outer bone marrow device. Samples were prepared with the cytotechnologist and deemed adequate. The needle was removed intact. Hemostasis was obtained with compression and a dressing was placed. The patient tolerated the procedure well without immediate post procedural complication. IMPRESSION: Successful CT guided left iliac bone marrow aspiration and core biopsy. Electronically Signed   By: Sandi Mariscal M.D.   On:  09/18/2019 12:35    ASSESSMENT & PLAN:   78 y.o. female with  1. Recently diagnosed Multiple Myeloma, RISS Stage III  Labs upon initial presentation from 12/08/18, blood counts are normal including WBC at 7.1k, HGB at 13.1, and PLT at 245k. Calcium normal at 10.3. Creatinine normal at 0.63. M spike at 0.5g. 12/13/18 Bone Scan revealed Multifocal uptake throughout the skeleton, consistent with diffuse metastatic disease. Primary tumor is not specified. 2. Uptake in the proximal right femur, consistent with lytic lesions. 3. Uptake in the ribs bilaterally as described. 4. Lesions in the proximal left humerus. 5. Diffuse uptake throughout the skull consistent with metastatic disease. 6. Right paramedian uptake at the manubrium.  12/13/18 CT Right Femur revealed Numerous lytic lesions involving the right femur and a lytic lesion in the left inferior pubic ramus. Overall appearance is most concerning for multiple myeloma  12/27/18 Pretreatment 24hour UPEP observed an M spike at 79m, and showed 1970mtotal protein/day.  12/27/18 Pretreatment MMP revealed M Protein at 0.5g with IgG Lambda specificity. Kappa:Lambda light chain ratio at 0.13, with Lambda at 40.3. There is less abnormal protein and light chains than I would expect from 30% plasma cells, which suggests hypo-secretory or non-secretory neoplastic plasma cells. Will have an impact in assessing response. 01/05/19 PET/CT revealed Innumerable lytic lesions in the skeleton compatible with myeloma. Most of the larger lesions are hypermetabolic, for example including a left proximal humeral shaft lesion with maximum SUV of 8.1 and a 2.8 cm lesion in the left T9 vertebral body with maximum SUV 5.1. Most of the smaller lytic lesions, and some of the larger lesions, do not demonstrate accentuated metabolic activity. 2. 1.2 cm in short axis lymph node in the left parapharyngeal space is hypermetabolic with maximum SUV 11.8. I do not see a separate mass in  the head and neck to give rise to this hypermetabolic lymph node. 3. Mosaic attenuation in the lower lobes, nonspecific possibly from air trapping. 4.  Aortic Atherosclerosis 5. Heterogeneous activity in the liver, making it hard to exclude small liver lesions. Consider hepatic protocol MRI with and without contrast for definitive assessment. Nonobstructive right nephrolithiasis. Old granulomatous disease  01/06/19 Bone Marrow biopsy revealed interstitial increase in plasma cells (28% aspirate, 40% CD138 immunohistochemistry). Plasma cells negative for light chains consistent with a non or weakly secretory myeloma   01/06/19 Cytogenetics revealed 37% of cells with trisomy 11 or 11q deletion, and 40.5% of cells with 17p mutation  S/p 5 cycles of KRD treatment  05/31/19 BM Biopsy revealed mild atypical plasmacytosis at 5% with polytypic variation.   06/01/19 PET/CT revealed "Dominant lesion in the LEFT humerus is decreased significantly in metabolic activity. Additional hypermetabolic skeletal lytic lesions have decreased in metabolic activity or similar to comparison exam (01/05/2019). No evidence of disease progression. 2. Multiple additional lytic lesions  do not have metabolic activity and unchanged. 3. No new skeletal lesions are identified. No soft tissue plasmacytoma identified. 4. Nodule / node in the LEFT parapharyngeal space which is intensely hypermetabolic not changed from prior. 5. New hypermetabolic LEFT lower lobe pulmonary nodule is indeterminate. Recommend close attention on follow-up 6. New obstructive hydronephrosis of the RIGHT kidney related to RIGHT UPJ stone."  2. 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  01/17/19 MRI Liver revealed Several appreciable liver lesions all have benign imaging characteristics. No MRI findings of metastatic involvement of the liver. 2. Scattered bony lesions  corresponding to the lytic lesions seen at PET-CT, compatible with active myeloma. 3. Aortic Atherosclerosis.  Mild cardiomegaly. 4. Diffuse hepatic steatosis.   3. Left lower lobe pulmonary nodule First seen on 06/01/19 PET/CT  PLAN: -Discussed pt labwork today, 09/25/19; Blood counts are good, blood chemistries are stable, low potassium levels & blood sugar is running high  -Discussed 09/18/2019 BM Bx Report which revealed "Slightly hypercellular bone marrow for age with trilineage hematopoiesis and 1% plasma cells." -Discussed 09/14/2019 PET/CT Whole Body Scan ([id number]) which revealed "1. There widespread tiny lytic lesions compatible with multiple myeloma. Index larger lesions are generally similar to the prior exam, with low-grade activity such as the left T9 vertebral body lesion with maximum SUV 4.5. Is mild increase in the activity associated with a mildly sclerotic left proximal humeral lesion, maximum SUV 4.8 (previously 3.5). 2. At the site of the prior left lower lobe nodule is currently more bandlike thickening, with maximum SUV only 1.9, probably benign, continued surveillance of this region suggested. 3. There several small but hypermetabolic lymph nodes. This includes a left parapharyngeal space node measuring 1.0 cm with maximum SUV 12.3 (stable); a left level IB lymph node measuring 0.5 cm with maximum SUV 4.8 (slightly larger than prior); and a left inguinal lymph node measuring 0.7 cm in short axis with maximum SUV 6.4 (previously 0.5 cm with maximum SUV 0.6). Significance of these lymph nodes uncertain, surveillance is recommended. 4. New 5 mm left lower lobe subpleural nodule on image 32/8, not appreciably hypermetabolic, surveillance suggested. 5. Focal subcutaneous stranding along the left perineum measuring about 2.6 by 1.1 cm on image 221/4, maximum SUV 12.5. This was not present previously and is most likely inflammatory, although given the notable SUV, surveillance of this  region is suggested. 6. Other imaging findings of potential clinical significance: Aortic Atherosclerosis (ICD10-I70.0). Coronary atherosclerosis. Old granulomatous disease. Mild right hydronephrosis due to a 7 mm right UPJ calculus. 2 mm right kidney upper pole nonobstructive renal calculus. Prominent stool throughout the colon favors constipation." -No overtly concerning findings on PET/CT Scan  -Will be hard to get a Lymph Node Bx due to their size  -Pt is still not interested in a transplant  -Due to the aggressive nature of Multiple Myeloma with 17p mutation maintenance Revlimid alone will not be enough -Plan on continuing Revlimid and dropping the 8th day of Carfilzomib for maintenance -Will continue maintenance until progression or intolerance  -Will have to continue to watch with scans  -Will adjust regimen after this cycle  -The pt has no prohibitive toxicities from continuing C9D1 Carfilzomib, Revlimid, and Dexamethasone at this time. -Continue 65m Claritin or Zyrtec and 245mPepcid for mild rashes, will continue to watch these -Adjusted premedications to add Tylenol and will split dose of Dexamethasone with pre-meds and pt will not take Decadron at home except on  D22 -Continue Fentanyl patch and 51m Oxycodone for break through pain -Continue Zometa every 4 weeks -Continue Acyclovir and 839mAspirin to mitigate risk of Shingles and blood clots respectively  FOLLOW UP: Patient will get C9D1 today, C9D2 tomorrow Will get C9D8 in 1 week Plz discontinue C9D9 (orders discontinued) Will get C9D15  Plz discontinue C9D16 (orders discontinued) Labs and port flush on D8 and D15 MD visit on C9D15 Continue Zometa q4weeks -plz schedule next 3 doses  The total time spent in the appt was 25 minutes and more than 50% was on counseling and direct patient cares.  All of the patient's questions were answered with apparent satisfaction. The patient knows to call the clinic with any problems,  questions or concerns.    GaSullivan LoneD MSStoutAHIVMS SCUpmc JamesonTProgress West Healthcare Centerematology/Oncology Physician CoWarner Hospital And Health Services(Office):       [phone] cell):  [phone]):           [phone]/11/2019 11:28 AM  I, JaYevette Edwardsam acting as a scribe for Dr. GaSullivan Lone  .I have reviewed the above documentation for accuracy and completeness, and I agree with the above. .GBrunetta GeneraD

## 2019-09-24 ENCOUNTER — Other Ambulatory Visit: Payer: Self-pay | Admitting: Hematology

## 2019-09-24 DIAGNOSIS — C9000 Multiple myeloma not having achieved remission: Secondary | ICD-10-CM

## 2019-09-25 ENCOUNTER — Telehealth: Payer: Self-pay

## 2019-09-25 ENCOUNTER — Inpatient Hospital Stay: Payer: Medicare Other

## 2019-09-25 ENCOUNTER — Other Ambulatory Visit: Payer: Self-pay

## 2019-09-25 ENCOUNTER — Inpatient Hospital Stay: Payer: Medicare Other | Attending: Hematology

## 2019-09-25 ENCOUNTER — Inpatient Hospital Stay (HOSPITAL_BASED_OUTPATIENT_CLINIC_OR_DEPARTMENT_OTHER): Payer: Medicare Other | Admitting: Hematology

## 2019-09-25 VITALS — BP 115/84 | HR 99 | Temp 98.3°F | Resp 18 | Ht 59.0 in | Wt 168.8 lb

## 2019-09-25 DIAGNOSIS — C9000 Multiple myeloma not having achieved remission: Secondary | ICD-10-CM | POA: Diagnosis present

## 2019-09-25 DIAGNOSIS — Z5111 Encounter for antineoplastic chemotherapy: Secondary | ICD-10-CM | POA: Diagnosis not present

## 2019-09-25 DIAGNOSIS — Z95828 Presence of other vascular implants and grafts: Secondary | ICD-10-CM

## 2019-09-25 DIAGNOSIS — Z7189 Other specified counseling: Secondary | ICD-10-CM

## 2019-09-25 DIAGNOSIS — Z5112 Encounter for antineoplastic immunotherapy: Secondary | ICD-10-CM | POA: Diagnosis present

## 2019-09-25 LAB — CBC WITH DIFFERENTIAL/PLATELET
Abs Immature Granulocytes: 0.1 10*3/uL — ABNORMAL HIGH (ref 0.00–0.07)
Basophils Absolute: 0.1 10*3/uL (ref 0.0–0.1)
Basophils Relative: 1 %
Eosinophils Absolute: 0.5 10*3/uL (ref 0.0–0.5)
Eosinophils Relative: 6 %
HCT: 39.1 % (ref 36.0–46.0)
Hemoglobin: 13.2 g/dL (ref 12.0–15.0)
Immature Granulocytes: 1 %
Lymphocytes Relative: 21 %
Lymphs Abs: 1.6 10*3/uL (ref 0.7–4.0)
MCH: 31.8 pg (ref 26.0–34.0)
MCHC: 33.8 g/dL (ref 30.0–36.0)
MCV: 94.2 fL (ref 80.0–100.0)
Monocytes Absolute: 1.3 10*3/uL — ABNORMAL HIGH (ref 0.1–1.0)
Monocytes Relative: 18 %
Neutro Abs: 3.9 10*3/uL (ref 1.7–7.7)
Neutrophils Relative %: 53 %
Platelets: 226 10*3/uL (ref 150–400)
RBC: 4.15 MIL/uL (ref 3.87–5.11)
RDW: 14.6 % (ref 11.5–15.5)
WBC: 7.4 10*3/uL (ref 4.0–10.5)
nRBC: 0 % (ref 0.0–0.2)

## 2019-09-25 LAB — CMP (CANCER CENTER ONLY)
ALT: 17 U/L (ref 0–44)
AST: 12 U/L — ABNORMAL LOW (ref 15–41)
Albumin: 3.2 g/dL — ABNORMAL LOW (ref 3.5–5.0)
Alkaline Phosphatase: 80 U/L (ref 38–126)
Anion gap: 14 (ref 5–15)
BUN: 14 mg/dL (ref 8–23)
CO2: 20 mmol/L — ABNORMAL LOW (ref 22–32)
Calcium: 9.5 mg/dL (ref 8.9–10.3)
Chloride: 103 mmol/L (ref 98–111)
Creatinine: 0.81 mg/dL (ref 0.44–1.00)
GFR, Est AFR Am: >60 mL/min (ref >60)
GFR, Estimated: >60 mL/min (ref >60)
Glucose, Bld: 244 mg/dL — ABNORMAL HIGH (ref 70–99)
Potassium: 3.4 mmol/L — ABNORMAL LOW (ref 3.5–5.1)
Sodium: 137 mmol/L (ref 135–145)
Total Bilirubin: 0.7 mg/dL (ref 0.3–1.2)
Total Protein: 6.4 g/dL — ABNORMAL LOW (ref 6.5–8.1)

## 2019-09-25 MED ORDER — DEXTROSE 5 % IV SOLN
27.0000 mg/m2 | Freq: Once | INTRAVENOUS | Status: AC
  Administered 2019-09-25: 50 mg via INTRAVENOUS
  Filled 2019-09-25: qty 15

## 2019-09-25 MED ORDER — PROCHLORPERAZINE MALEATE 10 MG PO TABS
ORAL_TABLET | ORAL | Status: AC
  Filled 2019-09-25: qty 1

## 2019-09-25 MED ORDER — HEPARIN SOD (PORK) LOCK FLUSH 100 UNIT/ML IV SOLN
500.0000 [IU] | Freq: Once | INTRAVENOUS | Status: AC | PRN
  Administered 2019-09-25: 500 [IU]
  Filled 2019-09-25: qty 5

## 2019-09-25 MED ORDER — PROCHLORPERAZINE MALEATE 10 MG PO TABS
10.0000 mg | ORAL_TABLET | Freq: Once | ORAL | Status: AC
  Administered 2019-09-25: 10 mg via ORAL

## 2019-09-25 MED ORDER — ACETAMINOPHEN 325 MG PO TABS
650.0000 mg | ORAL_TABLET | Freq: Once | ORAL | Status: AC
  Administered 2019-09-25: 650 mg via ORAL

## 2019-09-25 MED ORDER — ZOLEDRONIC ACID 4 MG/100ML IV SOLN
4.0000 mg | Freq: Once | INTRAVENOUS | Status: AC
  Administered 2019-09-25: 4 mg via INTRAVENOUS
  Filled 2019-09-25: qty 100

## 2019-09-25 MED ORDER — DEXAMETHASONE 4 MG PO TABS
20.0000 mg | ORAL_TABLET | Freq: Once | ORAL | Status: AC
  Administered 2019-09-25: 20 mg via ORAL

## 2019-09-25 MED ORDER — DEXAMETHASONE 4 MG PO TABS
ORAL_TABLET | ORAL | Status: AC
  Filled 2019-09-25: qty 5

## 2019-09-25 MED ORDER — SODIUM CHLORIDE 0.9% FLUSH
10.0000 mL | Freq: Once | INTRAVENOUS | Status: AC
  Administered 2019-09-25: 10 mL
  Filled 2019-09-25: qty 10

## 2019-09-25 MED ORDER — SODIUM CHLORIDE 0.9 % IV SOLN
Freq: Once | INTRAVENOUS | Status: DC
  Filled 2019-09-25: qty 250

## 2019-09-25 MED ORDER — ACETAMINOPHEN 325 MG PO TABS
ORAL_TABLET | ORAL | Status: AC
  Filled 2019-09-25: qty 2

## 2019-09-25 MED ORDER — SODIUM CHLORIDE 0.9% FLUSH
10.0000 mL | INTRAVENOUS | Status: DC | PRN
  Administered 2019-09-25: 10 mL
  Filled 2019-09-25: qty 10

## 2019-09-25 MED ORDER — FAMOTIDINE 20 MG PO TABS
ORAL_TABLET | ORAL | Status: AC
  Filled 2019-09-25: qty 1

## 2019-09-25 MED ORDER — DIPHENHYDRAMINE HCL 25 MG PO CAPS
ORAL_CAPSULE | ORAL | Status: AC
  Filled 2019-09-25: qty 1

## 2019-09-25 MED ORDER — FAMOTIDINE 20 MG PO TABS
20.0000 mg | ORAL_TABLET | Freq: Once | ORAL | Status: AC
  Administered 2019-09-25: 20 mg via ORAL

## 2019-09-25 MED ORDER — SODIUM CHLORIDE 0.9 % IV SOLN
Freq: Once | INTRAVENOUS | Status: AC
  Administered 2019-09-25: 12:00:00 via INTRAVENOUS
  Filled 2019-09-25: qty 250

## 2019-09-25 MED ORDER — DIPHENHYDRAMINE HCL 25 MG PO TABS
25.0000 mg | ORAL_TABLET | Freq: Once | ORAL | Status: AC
  Administered 2019-09-25: 25 mg via ORAL
  Filled 2019-09-25: qty 1

## 2019-09-25 NOTE — Telephone Encounter (Signed)
Pt having treatment tomorrow asked to stay accessed bio patch and sorbaview placed.

## 2019-09-25 NOTE — Patient Instructions (Signed)
[facility] Discharge Instructions for Patients Receiving Chemotherapy  Today you received the following chemotherapy agents Carfilzomib (KYPROLIS).  To help prevent nausea and vomiting after your treatment, we encourage you to take your nausea medication as prescribed.   If you develop nausea and vomiting that is not controlled by your nausea medication, call the clinic.   BELOW ARE SYMPTOMS THAT SHOULD BE REPORTED IMMEDIATELY:  *FEVER GREATER THAN 100.5 F  *CHILLS WITH OR WITHOUT FEVER  NAUSEA AND VOMITING THAT IS NOT CONTROLLED WITH YOUR NAUSEA MEDICATION  *UNUSUAL SHORTNESS OF BREATH  *UNUSUAL BRUISING OR BLEEDING  TENDERNESS IN MOUTH AND THROAT WITH OR WITHOUT PRESENCE OF ULCERS  *URINARY PROBLEMS  *BOWEL PROBLEMS  UNUSUAL RASH Items with * indicate a potential emergency and should be followed up as soon as possible.  Feel free to call the clinic should you have any questions or concerns. The clinic phone number is [phone].  Please show the [place] at check-in to the Emergency Department and triage nurse.  Influenza Virus Vaccine injection What is this medicine? INFLUENZA VIRUS VACCINE (in floo EN zuh VAHY ruhs vak SEEN) helps to reduce the risk of getting influenza also known as the flu. The vaccine only helps protect you against some strains of the flu. This medicine may be used for other purposes; ask your health care provider or pharmacist if you have questions. COMMON BRAND NAME(S): Afluria, Afluria Quadrivalent, Agriflu, Alfuria, FLUAD, Fluarix, Fluarix Quadrivalent, Flublok, Flublok Quadrivalent, FLUCELVAX, Flulaval, Fluvirin, Fluzone, Fluzone High-Dose, Fluzone Intradermal What should I tell my health care provider before I take this medicine? They need to know if you have any of these conditions:  bleeding disorder like hemophilia  fever or infection  Guillain-Barre syndrome or other neurological problems  immune system  problems  infection with the human immunodeficiency virus (HIV) or AIDS  low blood platelet counts  multiple sclerosis  an unusual or allergic reaction to influenza virus vaccine, latex, other medicines, foods, dyes, or preservatives. Different brands of vaccines contain different allergens. Some may contain latex or eggs. Talk to your doctor about your allergies to make sure that you get the right vaccine.  pregnant or trying to get pregnant  breast-feeding How should I use this medicine? This vaccine is for injection into a muscle or under the skin. It is given by a health care professional. A copy of Vaccine Information Statements will be given before each vaccination. Read this sheet carefully each time. The sheet may change frequently. Talk to your healthcare provider to see which vaccines are right for you. Some vaccines should not be used in all age groups. Overdosage: If you think you have taken too much of this medicine contact a poison control center or emergency room at once. NOTE: This medicine is only for you. Do not share this medicine with others. What if I miss a dose? This does not apply. What may interact with this medicine?  chemotherapy or radiation therapy  medicines that lower your immune system like etanercept, anakinra, infliximab, and adalimumab  medicines that treat or prevent blood clots like warfarin  phenytoin  steroid medicines like prednisone or cortisone  theophylline  vaccines This list may not describe all possible interactions. Give your health care provider a list of all the medicines, herbs, non-prescription drugs, or dietary supplements you use. Also tell them if you smoke, drink alcohol, or use illegal drugs. Some items may interact with your medicine. What should I watch for while using  this medicine? Report any side effects that do not go away within 3 days to your doctor or health care professional. Call your health care provider if any  unusual symptoms occur within 6 weeks of receiving this vaccine. You may still catch the flu, but the illness is not usually as bad. You cannot get the flu from the vaccine. The vaccine will not protect against colds or other illnesses that may cause fever. The vaccine is needed every year. What side effects may I notice from receiving this medicine? Side effects that you should report to your doctor or health care professional as soon as possible:  allergic reactions like skin rash, itching or hives, swelling of the face, lips, or tongue Side effects that usually do not require medical attention (report to your doctor or health care professional if they continue or are bothersome):  fever  headache  muscle aches and pains  pain, tenderness, redness, or swelling at the injection site  tiredness This list may not describe all possible side effects. Call your doctor for medical advice about side effects. You may report side effects to FDA at 1-800-FDA-1088. Where should I keep my medicine? The vaccine will be given by a health care professional in a clinic, pharmacy, doctor's office, or other health care setting. You will not be given vaccine doses to store at home. NOTE: This sheet is a summary. It may not cover all possible information. If you have questions about this medicine, talk to your doctor, pharmacist, or health care provider.  2020 Elsevier/Gold Standard (2018-10-25 08:45:43)  Coronavirus (COVID-19) Are you at risk?  Are you at risk for the Coronavirus (COVID-19)?  To be considered HIGH RISK for Coronavirus (COVID-19), you have to meet the following criteria:  . Traveled to [place], [place], [place], [place] or [place]; or in the [place] to [place], [place], [place], or [place]; and have fever, cough, and shortness of breath within the last 2 weeks of travel OR . Been in close contact with a person diagnosed with COVID-19 within the last 2 weeks and have fever,  cough, and shortness of breath . IF YOU DO NOT MEET THESE CRITERIA, YOU ARE CONSIDERED LOW RISK FOR COVID-19.  What to do if you are HIGH RISK for COVID-19?  Marland Kitchen If you are having a medical emergency, call 911. . Seek medical care right away. Before you go to a doctor's office, urgent care or emergency department, call ahead and tell them about your recent travel, contact with someone diagnosed with COVID-19, and your symptoms. You should receive instructions from your physician's office regarding next steps of care.  . When you arrive at healthcare provider, tell the healthcare staff immediately you have returned from visiting [place], [place], [place], [place] or [place]; or traveled in the [place] to [place], [place], [place], or [place]; in the last two weeks or you have been in close contact with a person diagnosed with COVID-19 in the last 2 weeks.   . Tell the health care staff about your symptoms: fever, cough and shortness of breath. . After you have been seen by a medical provider, you will be either: o Tested for (COVID-19) and discharged home on quarantine except to seek medical care if symptoms worsen, and asked to  - Stay home and avoid contact with others until you get your results (4-5 days)  - Avoid travel on public transportation if possible (such as bus, train, or airplane) or o [employer]  to the Emergency Department by EMS for evaluation, COVID-19 testing, and possible admission depending on your condition and test results.  What to do if you are LOW RISK for COVID-19?  Reduce your risk of any infection by using the same precautions used for avoiding the common cold or flu:  Marland Kitchen Wash your hands often with soap and warm water for at least 20 seconds.  If soap and water are not readily available, use an alcohol-based hand sanitizer with at least 60% alcohol.  . If coughing or sneezing, cover your mouth and nose by coughing or sneezing into the elbow areas of your shirt or coat,  into a tissue or into your sleeve (not your hands). . Avoid shaking hands with others and consider head nods or verbal greetings only. . Avoid touching your eyes, nose, or mouth with unwashed hands.  . Avoid close contact with people who are sick. . Avoid places or events with large numbers of people in one location, like concerts or sporting events. . Carefully consider travel plans you have or are making. . If you are planning any travel outside or inside the [name], visit the CDC's Travelers' Health webpage for the latest health notices. . If you have some symptoms but not all symptoms, continue to monitor at home and seek medical attention if your symptoms worsen. . If you are having a medical emergency, call 911.   [place] / e-Visit: [email or web address]         [facility]: [facility]: [phone]                   [facility]: [phone]

## 2019-09-26 ENCOUNTER — Other Ambulatory Visit: Payer: Self-pay | Admitting: *Deleted

## 2019-09-26 ENCOUNTER — Other Ambulatory Visit: Payer: Self-pay

## 2019-09-26 ENCOUNTER — Inpatient Hospital Stay: Payer: Medicare Other

## 2019-09-26 VITALS — BP 117/66 | HR 93 | Temp 98.8°F | Resp 18

## 2019-09-26 DIAGNOSIS — Z7189 Other specified counseling: Secondary | ICD-10-CM

## 2019-09-26 DIAGNOSIS — C9000 Multiple myeloma not having achieved remission: Secondary | ICD-10-CM

## 2019-09-26 DIAGNOSIS — Z5112 Encounter for antineoplastic immunotherapy: Secondary | ICD-10-CM | POA: Diagnosis not present

## 2019-09-26 MED ORDER — FAMOTIDINE 20 MG PO TABS
ORAL_TABLET | ORAL | Status: AC
  Filled 2019-09-26: qty 1

## 2019-09-26 MED ORDER — DIPHENHYDRAMINE HCL 25 MG PO CAPS
25.0000 mg | ORAL_CAPSULE | Freq: Once | ORAL | Status: AC
  Administered 2019-09-26: 25 mg via ORAL

## 2019-09-26 MED ORDER — ACETAMINOPHEN 325 MG PO TABS
ORAL_TABLET | ORAL | Status: AC
  Filled 2019-09-26: qty 2

## 2019-09-26 MED ORDER — DEXAMETHASONE 4 MG PO TABS
ORAL_TABLET | ORAL | Status: AC
  Filled 2019-09-26: qty 5

## 2019-09-26 MED ORDER — PROCHLORPERAZINE MALEATE 10 MG PO TABS
ORAL_TABLET | ORAL | Status: AC
  Filled 2019-09-26: qty 1

## 2019-09-26 MED ORDER — PROCHLORPERAZINE MALEATE 10 MG PO TABS
10.0000 mg | ORAL_TABLET | Freq: Once | ORAL | Status: AC
  Administered 2019-09-26: 10 mg via ORAL

## 2019-09-26 MED ORDER — SODIUM CHLORIDE 0.9% FLUSH
10.0000 mL | INTRAVENOUS | Status: DC | PRN
  Administered 2019-09-26: 10 mL
  Filled 2019-09-26: qty 10

## 2019-09-26 MED ORDER — DIPHENHYDRAMINE HCL 25 MG PO CAPS
ORAL_CAPSULE | ORAL | Status: AC
  Filled 2019-09-26: qty 1

## 2019-09-26 MED ORDER — FAMOTIDINE 20 MG PO TABS
20.0000 mg | ORAL_TABLET | Freq: Once | ORAL | Status: AC
  Administered 2019-09-26: 20 mg via ORAL

## 2019-09-26 MED ORDER — SODIUM CHLORIDE 0.9 % IV SOLN
Freq: Once | INTRAVENOUS | Status: AC
  Administered 2019-09-26: 13:00:00 via INTRAVENOUS
  Filled 2019-09-26: qty 250

## 2019-09-26 MED ORDER — DEXTROSE 5 % IV SOLN
27.0000 mg/m2 | Freq: Once | INTRAVENOUS | Status: AC
  Administered 2019-09-26: 50 mg via INTRAVENOUS
  Filled 2019-09-26: qty 10

## 2019-09-26 MED ORDER — DEXAMETHASONE 4 MG PO TABS
20.0000 mg | ORAL_TABLET | Freq: Once | ORAL | Status: AC
  Administered 2019-09-26: 20 mg via ORAL

## 2019-09-26 MED ORDER — HEPARIN SOD (PORK) LOCK FLUSH 100 UNIT/ML IV SOLN
500.0000 [IU] | Freq: Once | INTRAVENOUS | Status: AC | PRN
  Administered 2019-09-26: 500 [IU]
  Filled 2019-09-26: qty 5

## 2019-09-26 MED ORDER — ACETAMINOPHEN 325 MG PO TABS
650.0000 mg | ORAL_TABLET | Freq: Once | ORAL | Status: AC
  Administered 2019-09-26: 650 mg via ORAL

## 2019-09-26 NOTE — Patient Instructions (Signed)
[facility] Discharge Instructions for Patients Receiving Chemotherapy  Today you received the following chemotherapy agents Carfilzomib (KYPROLIS).  To help prevent nausea and vomiting after your treatment, we encourage you to take your nausea medication as prescribed.   If you develop nausea and vomiting that is not controlled by your nausea medication, call the clinic.   BELOW ARE SYMPTOMS THAT SHOULD BE REPORTED IMMEDIATELY:  *FEVER GREATER THAN 100.5 F  *CHILLS WITH OR WITHOUT FEVER  NAUSEA AND VOMITING THAT IS NOT CONTROLLED WITH YOUR NAUSEA MEDICATION  *UNUSUAL SHORTNESS OF BREATH  *UNUSUAL BRUISING OR BLEEDING  TENDERNESS IN MOUTH AND THROAT WITH OR WITHOUT PRESENCE OF ULCERS  *URINARY PROBLEMS  *BOWEL PROBLEMS  UNUSUAL RASH Items with * indicate a potential emergency and should be followed up as soon as possible.  Feel free to call the clinic should you have any questions or concerns. The clinic phone number is [phone].  Please show the [facility] at check-in to the Emergency Department and triage nurse.  Influenza Virus Vaccine injection What is this medicine? INFLUENZA VIRUS VACCINE (in floo EN zuh VAHY ruhs vak SEEN) helps to reduce the risk of getting influenza also known as the flu. The vaccine only helps protect you against some strains of the flu. This medicine may be used for other purposes; ask your health care provider or pharmacist if you have questions. COMMON BRAND NAME(S): Afluria, Afluria Quadrivalent, Agriflu, Alfuria, FLUAD, Fluarix, Fluarix Quadrivalent, Flublok, Flublok Quadrivalent, FLUCELVAX, Flulaval, Fluvirin, Fluzone, Fluzone High-Dose, Fluzone Intradermal What should I tell my health care provider before I take this medicine? They need to know if you have any of these conditions:  bleeding disorder like hemophilia  fever or infection  Guillain-Barre syndrome or other neurological problems  immune system  problems  infection with the human immunodeficiency virus (HIV) or AIDS  low blood platelet counts  multiple sclerosis  an unusual or allergic reaction to influenza virus vaccine, latex, other medicines, foods, dyes, or preservatives. Different brands of vaccines contain different allergens. Some may contain latex or eggs. Talk to your doctor about your allergies to make sure that you get the right vaccine.  pregnant or trying to get pregnant  breast-feeding How should I use this medicine? This vaccine is for injection into a muscle or under the skin. It is given by a health care professional. A copy of Vaccine Information Statements will be given before each vaccination. Read this sheet carefully each time. The sheet may change frequently. Talk to your healthcare provider to see which vaccines are right for you. Some vaccines should not be used in all age groups. Overdosage: If you think you have taken too much of this medicine contact a poison control center or emergency room at once. NOTE: This medicine is only for you. Do not share this medicine with others. What if I miss a dose? This does not apply. What may interact with this medicine?  chemotherapy or radiation therapy  medicines that lower your immune system like etanercept, anakinra, infliximab, and adalimumab  medicines that treat or prevent blood clots like warfarin  phenytoin  steroid medicines like prednisone or cortisone  theophylline  vaccines This list may not describe all possible interactions. Give your health care provider a list of all the medicines, herbs, non-prescription drugs, or dietary supplements you use. Also tell them if you smoke, drink alcohol, or use illegal drugs. Some items may interact with your medicine. What should I watch for while using  this medicine? Report any side effects that do not go away within 3 days to your doctor or health care professional. Call your health care provider if any  unusual symptoms occur within 6 weeks of receiving this vaccine. You may still catch the flu, but the illness is not usually as bad. You cannot get the flu from the vaccine. The vaccine will not protect against colds or other illnesses that may cause fever. The vaccine is needed every year. What side effects may I notice from receiving this medicine? Side effects that you should report to your doctor or health care professional as soon as possible:  allergic reactions like skin rash, itching or hives, swelling of the face, lips, or tongue Side effects that usually do not require medical attention (report to your doctor or health care professional if they continue or are bothersome):  fever  headache  muscle aches and pains  pain, tenderness, redness, or swelling at the injection site  tiredness This list may not describe all possible side effects. Call your doctor for medical advice about side effects. You may report side effects to FDA at 1-800-FDA-1088. Where should I keep my medicine? The vaccine will be given by a health care professional in a clinic, pharmacy, doctor's office, or other health care setting. You will not be given vaccine doses to store at home. NOTE: This sheet is a summary. It may not cover all possible information. If you have questions about this medicine, talk to your doctor, pharmacist, or health care provider.  2020 Elsevier/Gold Standard (2018-10-25 08:45:43)  Coronavirus (COVID-19) Are you at risk?  Are you at risk for the Coronavirus (COVID-19)?  To be considered HIGH RISK for Coronavirus (COVID-19), you have to meet the following criteria:  . Traveled to [place], [place], [place], [place] or [place]; or in the [place] to [place], [place], [place], or [place]; and have fever, cough, and shortness of breath within the last 2 weeks of travel OR . Been in close contact with a person diagnosed with COVID-19 within the last 2 weeks and have fever,  cough, and shortness of breath . IF YOU DO NOT MEET THESE CRITERIA, YOU ARE CONSIDERED LOW RISK FOR COVID-19.  What to do if you are HIGH RISK for COVID-19?  Marland Kitchen If you are having a medical emergency, call 911. . Seek medical care right away. Before you go to a doctor's office, urgent care or emergency department, call ahead and tell them about your recent travel, contact with someone diagnosed with COVID-19, and your symptoms. You should receive instructions from your physician's office regarding next steps of care.  . When you arrive at healthcare provider, tell the healthcare staff immediately you have returned from visiting [place], [place], [place], [place] or [place]; or traveled in the [place] to [place], [place], [place], or [place]; in the last two weeks or you have been in close contact with a person diagnosed with COVID-19 in the last 2 weeks.   . Tell the health care staff about your symptoms: fever, cough and shortness of breath. . After you have been seen by a medical provider, you will be either: o Tested for (COVID-19) and discharged home on quarantine except to seek medical care if symptoms worsen, and asked to  - Stay home and avoid contact with others until you get your results (4-5 days)  - Avoid travel on public transportation if possible (such as bus, train, or airplane) or o [employer]  to the Emergency Department by EMS for evaluation, COVID-19 testing, and possible admission depending on your condition and test results.  What to do if you are LOW RISK for COVID-19?  Reduce your risk of any infection by using the same precautions used for avoiding the common cold or flu:  Marland Kitchen Wash your hands often with soap and warm water for at least 20 seconds.  If soap and water are not readily available, use an alcohol-based hand sanitizer with at least 60% alcohol.  . If coughing or sneezing, cover your mouth and nose by coughing or sneezing into the elbow areas of your shirt or coat,  into a tissue or into your sleeve (not your hands). . Avoid shaking hands with others and consider head nods or verbal greetings only. . Avoid touching your eyes, nose, or mouth with unwashed hands.  . Avoid close contact with people who are sick. . Avoid places or events with large numbers of people in one location, like concerts or sporting events. . Carefully consider travel plans you have or are making. . If you are planning any travel outside or inside the [name], visit the CDC's Travelers' Health webpage for the latest health notices. . If you have some symptoms but not all symptoms, continue to monitor at home and seek medical attention if your symptoms worsen. . If you are having a medical emergency, call 911.   P[facility]ager / e-Visit: [email or web address]         [facility]: [facility]: [phone]                   [facility]: [phone]

## 2019-09-26 NOTE — Telephone Encounter (Signed)
Opened in error

## 2019-09-27 ENCOUNTER — Telehealth: Payer: Self-pay | Admitting: Hematology

## 2019-09-27 ENCOUNTER — Encounter (HOSPITAL_COMMUNITY): Payer: Self-pay | Admitting: Hematology

## 2019-09-27 ENCOUNTER — Other Ambulatory Visit: Payer: Self-pay | Admitting: *Deleted

## 2019-09-27 DIAGNOSIS — C9000 Multiple myeloma not having achieved remission: Secondary | ICD-10-CM

## 2019-09-27 MED ORDER — LENALIDOMIDE 15 MG PO CAPS: ORAL_CAPSULE | ORAL | 0 refills | Status: DC

## 2019-09-27 NOTE — Telephone Encounter (Signed)
I talk with patient regarding adjustment on 10/26

## 2019-09-27 NOTE — Telephone Encounter (Signed)
Revlimid refilled per OV note 10/12 and verbal order from Dr.Kale Revlimid escribed to [facility]) [place], [place] [phone], 09/27/2019

## 2019-10-01 ENCOUNTER — Encounter: Payer: Self-pay | Admitting: Hematology

## 2019-10-02 ENCOUNTER — Other Ambulatory Visit: Payer: Self-pay | Admitting: *Deleted

## 2019-10-02 ENCOUNTER — Inpatient Hospital Stay: Payer: Medicare Other

## 2019-10-02 ENCOUNTER — Other Ambulatory Visit: Payer: Self-pay

## 2019-10-02 ENCOUNTER — Encounter: Payer: Self-pay | Admitting: Hematology

## 2019-10-02 VITALS — BP 136/64 | HR 81 | Temp 98.5°F | Resp 18 | Wt 168.5 lb

## 2019-10-02 DIAGNOSIS — C9000 Multiple myeloma not having achieved remission: Secondary | ICD-10-CM

## 2019-10-02 DIAGNOSIS — Z7189 Other specified counseling: Secondary | ICD-10-CM

## 2019-10-02 DIAGNOSIS — Z5112 Encounter for antineoplastic immunotherapy: Secondary | ICD-10-CM | POA: Diagnosis not present

## 2019-10-02 DIAGNOSIS — Z5111 Encounter for antineoplastic chemotherapy: Secondary | ICD-10-CM

## 2019-10-02 DIAGNOSIS — Z95828 Presence of other vascular implants and grafts: Secondary | ICD-10-CM

## 2019-10-02 DIAGNOSIS — C7951 Secondary malignant neoplasm of bone: Secondary | ICD-10-CM

## 2019-10-02 LAB — CMP (CANCER CENTER ONLY)
ALT: 16 U/L (ref 0–44)
AST: 11 U/L — ABNORMAL LOW (ref 15–41)
Albumin: 2.9 g/dL — ABNORMAL LOW (ref 3.5–5.0)
Alkaline Phosphatase: 74 U/L (ref 38–126)
Anion gap: 14 (ref 5–15)
BUN: 12 mg/dL (ref 8–23)
CO2: 19 mmol/L — ABNORMAL LOW (ref 22–32)
Calcium: 8.7 mg/dL — ABNORMAL LOW (ref 8.9–10.3)
Chloride: 104 mmol/L (ref 98–111)
Creatinine: 0.75 mg/dL (ref 0.44–1.00)
GFR, Est AFR Am: >60 mL/min (ref >60)
GFR, Estimated: >60 mL/min (ref >60)
Glucose, Bld: 191 mg/dL — ABNORMAL HIGH (ref 70–99)
Potassium: 3.3 mmol/L — ABNORMAL LOW (ref 3.5–5.1)
Sodium: 137 mmol/L (ref 135–145)
Total Bilirubin: 0.6 mg/dL (ref 0.3–1.2)
Total Protein: 5.9 g/dL — ABNORMAL LOW (ref 6.5–8.1)

## 2019-10-02 LAB — CBC WITH DIFFERENTIAL/PLATELET
Abs Immature Granulocytes: 0.06 10*3/uL (ref 0.00–0.07)
Basophils Absolute: 0 10*3/uL (ref 0.0–0.1)
Basophils Relative: 0 %
Eosinophils Absolute: 0.1 10*3/uL (ref 0.0–0.5)
Eosinophils Relative: 2 %
HCT: 36.1 % (ref 36.0–46.0)
Hemoglobin: 12.1 g/dL (ref 12.0–15.0)
Immature Granulocytes: 1 %
Lymphocytes Relative: 23 %
Lymphs Abs: 1.4 10*3/uL (ref 0.7–4.0)
MCH: 31.7 pg (ref 26.0–34.0)
MCHC: 33.5 g/dL (ref 30.0–36.0)
MCV: 94.5 fL (ref 80.0–100.0)
Monocytes Absolute: 1.5 10*3/uL — ABNORMAL HIGH (ref 0.1–1.0)
Monocytes Relative: 25 %
Neutro Abs: 3 10*3/uL (ref 1.7–7.7)
Neutrophils Relative %: 49 %
Platelets: 161 10*3/uL (ref 150–400)
RBC: 3.82 MIL/uL — ABNORMAL LOW (ref 3.87–5.11)
RDW: 14.6 % (ref 11.5–15.5)
WBC: 6.1 10*3/uL (ref 4.0–10.5)
nRBC: 0 % (ref 0.0–0.2)

## 2019-10-02 MED ORDER — SODIUM CHLORIDE 0.9% FLUSH
10.0000 mL | Freq: Once | INTRAVENOUS | Status: AC
  Administered 2019-10-02: 10 mL
  Filled 2019-10-02: qty 10

## 2019-10-02 MED ORDER — HEPARIN SOD (PORK) LOCK FLUSH 100 UNIT/ML IV SOLN
500.0000 [IU] | Freq: Once | INTRAVENOUS | Status: AC | PRN
  Administered 2019-10-02: 500 [IU]
  Filled 2019-10-02: qty 5

## 2019-10-02 MED ORDER — FENTANYL 12 MCG/HR TD PT72: 1.0000 | MEDICATED_PATCH | TRANSDERMAL | 0 refills | Status: DC

## 2019-10-02 MED ORDER — DIPHENHYDRAMINE HCL 25 MG PO CAPS
25.0000 mg | ORAL_CAPSULE | Freq: Once | ORAL | Status: AC
  Administered 2019-10-02: 25 mg via ORAL

## 2019-10-02 MED ORDER — SODIUM CHLORIDE 0.9% FLUSH
10.0000 mL | INTRAVENOUS | Status: DC | PRN
  Administered 2019-10-02: 10 mL
  Filled 2019-10-02: qty 10

## 2019-10-02 MED ORDER — DEXTROSE 5 % IV SOLN
38.0000 mg/m2 | Freq: Once | INTRAVENOUS | Status: AC
  Administered 2019-10-02: 70 mg via INTRAVENOUS
  Filled 2019-10-02: qty 30

## 2019-10-02 MED ORDER — FAMOTIDINE 20 MG PO TABS
ORAL_TABLET | ORAL | Status: AC
  Filled 2019-10-02: qty 1

## 2019-10-02 MED ORDER — FAMOTIDINE 20 MG PO TABS
20.0000 mg | ORAL_TABLET | Freq: Once | ORAL | Status: AC
  Administered 2019-10-02: 20 mg via ORAL

## 2019-10-02 MED ORDER — SODIUM CHLORIDE 0.9 % IV SOLN
Freq: Once | INTRAVENOUS | Status: AC
  Administered 2019-10-02: 14:00:00 via INTRAVENOUS
  Filled 2019-10-02: qty 250

## 2019-10-02 MED ORDER — ACETAMINOPHEN 325 MG PO TABS
ORAL_TABLET | ORAL | Status: AC
  Filled 2019-10-02: qty 2

## 2019-10-02 MED ORDER — DOCUSATE SODIUM 100 MG PO CAPS: 200.0000 mg | ORAL_CAPSULE | Freq: Every day | ORAL | 1 refills | Status: DC

## 2019-10-02 MED ORDER — ACETAMINOPHEN 325 MG PO TABS
650.0000 mg | ORAL_TABLET | Freq: Once | ORAL | Status: AC
  Administered 2019-10-02: 650 mg via ORAL

## 2019-10-02 MED ORDER — PROCHLORPERAZINE MALEATE 10 MG PO TABS
ORAL_TABLET | ORAL | Status: AC
  Filled 2019-10-02: qty 1

## 2019-10-02 MED ORDER — DIPHENHYDRAMINE HCL 25 MG PO CAPS
ORAL_CAPSULE | ORAL | Status: AC
  Filled 2019-10-02: qty 1

## 2019-10-02 MED ORDER — DEXAMETHASONE 4 MG PO TABS
20.0000 mg | ORAL_TABLET | Freq: Once | ORAL | Status: AC
  Administered 2019-10-02: 20 mg via ORAL

## 2019-10-02 MED ORDER — PROCHLORPERAZINE MALEATE 10 MG PO TABS
10.0000 mg | ORAL_TABLET | Freq: Once | ORAL | Status: AC
  Administered 2019-10-02: 10 mg via ORAL

## 2019-10-02 MED ORDER — DEXAMETHASONE 4 MG PO TABS
ORAL_TABLET | ORAL | Status: AC
  Filled 2019-10-02: qty 5

## 2019-10-02 NOTE — Patient Instructions (Signed)
[facility] Discharge Instructions for Patients Receiving Chemotherapy  Today you received the following chemotherapy agents Carfilzomib (KYPROLIS).  To help prevent nausea and vomiting after your treatment, we encourage you to take your nausea medication as prescribed.   If you develop nausea and vomiting that is not controlled by your nausea medication, call the clinic.   BELOW ARE SYMPTOMS THAT SHOULD BE REPORTED IMMEDIATELY:  *FEVER GREATER THAN 100.5 F  *CHILLS WITH OR WITHOUT FEVER  NAUSEA AND VOMITING THAT IS NOT CONTROLLED WITH YOUR NAUSEA MEDICATION  *UNUSUAL SHORTNESS OF BREATH  *UNUSUAL BRUISING OR BLEEDING  TENDERNESS IN MOUTH AND THROAT WITH OR WITHOUT PRESENCE OF ULCERS  *URINARY PROBLEMS  *BOWEL PROBLEMS  UNUSUAL RASH Items with * indicate a potential emergency and should be followed up as soon as possible.  Feel free to call the clinic should you have any questions or concerns. The clinic phone number is [phone].  Please show the [facility] at check-in to the Emergency Department and triage nurse.  Influenza Virus Vaccine injection What is this medicine? INFLUENZA VIRUS VACCINE (in floo EN zuh VAHY ruhs vak SEEN) helps to reduce the risk of getting influenza also known as the flu. The vaccine only helps protect you against some strains of the flu. This medicine may be used for other purposes; ask your health care provider or pharmacist if you have questions. COMMON BRAND NAME(S): Afluria, Afluria Quadrivalent, Agriflu, Alfuria, FLUAD, Fluarix, Fluarix Quadrivalent, Flublok, Flublok Quadrivalent, FLUCELVAX, Flulaval, Fluvirin, Fluzone, Fluzone High-Dose, Fluzone Intradermal What should I tell my health care provider before I take this medicine? They need to know if you have any of these conditions:  bleeding disorder like hemophilia  fever or infection  Guillain-Barre syndrome or other neurological problems  immune system  problems  infection with the human immunodeficiency virus (HIV) or AIDS  low blood platelet counts  multiple sclerosis  an unusual or allergic reaction to influenza virus vaccine, latex, other medicines, foods, dyes, or preservatives. Different brands of vaccines contain different allergens. Some may contain latex or eggs. Talk to your doctor about your allergies to make sure that you get the right vaccine.  pregnant or trying to get pregnant  breast-feeding How should I use this medicine? This vaccine is for injection into a muscle or under the skin. It is given by a health care professional. A copy of Vaccine Information Statements will be given before each vaccination. Read this sheet carefully each time. The sheet may change frequently. Talk to your healthcare provider to see which vaccines are right for you. Some vaccines should not be used in all age groups. Overdosage: If you think you have taken too much of this medicine contact a poison control center or emergency room at once. NOTE: This medicine is only for you. Do not share this medicine with others. What if I miss a dose? This does not apply. What may interact with this medicine?  chemotherapy or radiation therapy  medicines that lower your immune system like etanercept, anakinra, infliximab, and adalimumab  medicines that treat or prevent blood clots like warfarin  phenytoin  steroid medicines like prednisone or cortisone  theophylline  vaccines This list may not describe all possible interactions. Give your health care provider a list of all the medicines, herbs, non-prescription drugs, or dietary supplements you use. Also tell them if you smoke, drink alcohol, or use illegal drugs. Some items may interact with your medicine. What should I watch for while using  this medicine? Report any side effects that do not go away within 3 days to your doctor or health care professional. Call your health care provider if any  unusual symptoms occur within 6 weeks of receiving this vaccine. You may still catch the flu, but the illness is not usually as bad. You cannot get the flu from the vaccine. The vaccine will not protect against colds or other illnesses that may cause fever. The vaccine is needed every year. What side effects may I notice from receiving this medicine? Side effects that you should report to your doctor or health care professional as soon as possible:  allergic reactions like skin rash, itching or hives, swelling of the face, lips, or tongue Side effects that usually do not require medical attention (report to your doctor or health care professional if they continue or are bothersome):  fever  headache  muscle aches and pains  pain, tenderness, redness, or swelling at the injection site  tiredness This list may not describe all possible side effects. Call your doctor for medical advice about side effects. You may report side effects to FDA at 1-800-FDA-1088. Where should I keep my medicine? The vaccine will be given by a health care professional in a clinic, pharmacy, doctor's office, or other health care setting. You will not be given vaccine doses to store at home. NOTE: This sheet is a summary. It may not cover all possible information. If you have questions about this medicine, talk to your doctor, pharmacist, or health care provider.  2020 Elsevier/Gold Standard (2018-10-25 08:45:43)  Coronavirus (COVID-19) Are you at risk?  Are you at risk for the Coronavirus (COVID-19)?  To be considered HIGH RISK for Coronavirus (COVID-19), you have to meet the following criteria:  . Traveled to [place], [place], [place], [place] or [place]; or in the [place] to [place], [place], [place], or [place]; and have fever, cough, and shortness of breath within the last 2 weeks of travel OR . Been in close contact with a person diagnosed with COVID-19 within the last 2 weeks and have fever,  cough, and shortness of breath . IF YOU DO NOT MEET THESE CRITERIA, YOU ARE CONSIDERED LOW RISK FOR COVID-19.  What to do if you are HIGH RISK for COVID-19?  Marland Kitchen If you are having a medical emergency, call 911. . Seek medical care right away. Before you go to a doctor's office, urgent care or emergency department, call ahead and tell them about your recent travel, contact with someone diagnosed with COVID-19, and your symptoms. You should receive instructions from your physician's office regarding next steps of care.  . When you arrive at healthcare provider, tell the healthcare staff immediately you have returned from visiting [place], [place], [place], [place] or [place]; or traveled in the [place] to [place], [place], [place], or [place]; in the last two weeks or you have been in close contact with a person diagnosed with COVID-19 in the last 2 weeks.   . Tell the health care staff about your symptoms: fever, cough and shortness of breath. . After you have been seen by a medical provider, you will be either: o Tested for (COVID-19) and discharged home on quarantine except to seek medical care if symptoms worsen, and asked to  - Stay home and avoid contact with others until you get your results (4-5 days)  - Avoid travel on public transportation if possible (such as bus, train, or airplane) or o [employer]  to the Emergency Department by EMS for evaluation, COVID-19 testing, and possible admission depending on your condition and test results.  What to do if you are LOW RISK for COVID-19?  Reduce your risk of any infection by using the same precautions used for avoiding the common cold or flu:  Marland Kitchen Wash your hands often with soap and warm water for at least 20 seconds.  If soap and water are not readily available, use an alcohol-based hand sanitizer with at least 60% alcohol.  . If coughing or sneezing, cover your mouth and nose by coughing or sneezing into the elbow areas of your shirt or coat,  into a tissue or into your sleeve (not your hands). . Avoid shaking hands with others and consider head nods or verbal greetings only. . Avoid touching your eyes, nose, or mouth with unwashed hands.  . Avoid close contact with people who are sick. . Avoid places or events with large numbers of people in one location, like concerts or sporting events. . Carefully consider travel plans you have or are making. . If you are planning any travel outside or inside the [name], visit the CDC's Travelers' Health webpage for the latest health notices. . If you have some symptoms but not all symptoms, continue to monitor at home and seek medical attention if your symptoms worsen. . If you are having a medical emergency, call 911.   [facility] / e-Visit: [email or web address]         [facility]: [facility]: [phone]                   [facility]: [phone]

## 2019-10-02 NOTE — Patient Instructions (Signed)

## 2019-10-02 NOTE — Telephone Encounter (Signed)
Requested refill for fentanyl and docusate

## 2019-10-03 ENCOUNTER — Ambulatory Visit: Payer: Medicare Other

## 2019-10-09 ENCOUNTER — Encounter: Payer: Self-pay | Admitting: Hematology

## 2019-10-09 ENCOUNTER — Other Ambulatory Visit: Payer: Self-pay

## 2019-10-09 ENCOUNTER — Inpatient Hospital Stay: Payer: Medicare Other

## 2019-10-09 ENCOUNTER — Other Ambulatory Visit: Payer: Medicare Other

## 2019-10-09 ENCOUNTER — Inpatient Hospital Stay (HOSPITAL_BASED_OUTPATIENT_CLINIC_OR_DEPARTMENT_OTHER): Payer: Medicare Other | Admitting: Hematology

## 2019-10-09 VITALS — BP 137/78 | HR 94 | Temp 98.9°F | Resp 17 | Ht 59.0 in | Wt 170.4 lb

## 2019-10-09 DIAGNOSIS — C9000 Multiple myeloma not having achieved remission: Secondary | ICD-10-CM

## 2019-10-09 DIAGNOSIS — Z7189 Other specified counseling: Secondary | ICD-10-CM

## 2019-10-09 DIAGNOSIS — Z95828 Presence of other vascular implants and grafts: Secondary | ICD-10-CM

## 2019-10-09 DIAGNOSIS — Z5111 Encounter for antineoplastic chemotherapy: Secondary | ICD-10-CM | POA: Diagnosis not present

## 2019-10-09 DIAGNOSIS — Z5112 Encounter for antineoplastic immunotherapy: Secondary | ICD-10-CM | POA: Diagnosis not present

## 2019-10-09 LAB — CMP (CANCER CENTER ONLY)
ALT: 16 U/L (ref 0–44)
AST: 12 U/L — ABNORMAL LOW (ref 15–41)
Albumin: 3.3 g/dL — ABNORMAL LOW (ref 3.5–5.0)
Alkaline Phosphatase: 73 U/L (ref 38–126)
Anion gap: 14 (ref 5–15)
BUN: 9 mg/dL (ref 8–23)
CO2: 20 mmol/L — ABNORMAL LOW (ref 22–32)
Calcium: 9.4 mg/dL (ref 8.9–10.3)
Chloride: 104 mmol/L (ref 98–111)
Creatinine: 0.81 mg/dL (ref 0.44–1.00)
GFR, Est AFR Am: >60 mL/min (ref >60)
GFR, Estimated: >60 mL/min (ref >60)
Glucose, Bld: 219 mg/dL — ABNORMAL HIGH (ref 70–99)
Potassium: 3.6 mmol/L (ref 3.5–5.1)
Sodium: 138 mmol/L (ref 135–145)
Total Bilirubin: 0.6 mg/dL (ref 0.3–1.2)
Total Protein: 6.3 g/dL — ABNORMAL LOW (ref 6.5–8.1)

## 2019-10-09 LAB — CBC WITH DIFFERENTIAL/PLATELET
Abs Immature Granulocytes: 0.12 10*3/uL — ABNORMAL HIGH (ref 0.00–0.07)
Basophils Absolute: 0 10*3/uL (ref 0.0–0.1)
Basophils Relative: 1 %
Eosinophils Absolute: 0.2 10*3/uL (ref 0.0–0.5)
Eosinophils Relative: 3 %
HCT: 37.6 % (ref 36.0–46.0)
Hemoglobin: 12.5 g/dL (ref 12.0–15.0)
Immature Granulocytes: 2 %
Lymphocytes Relative: 20 %
Lymphs Abs: 1.3 10*3/uL (ref 0.7–4.0)
MCH: 31.6 pg (ref 26.0–34.0)
MCHC: 33.2 g/dL (ref 30.0–36.0)
MCV: 95.2 fL (ref 80.0–100.0)
Monocytes Absolute: 1.1 10*3/uL — ABNORMAL HIGH (ref 0.1–1.0)
Monocytes Relative: 16 %
Neutro Abs: 3.8 10*3/uL (ref 1.7–7.7)
Neutrophils Relative %: 58 %
Platelets: 172 10*3/uL (ref 150–400)
RBC: 3.95 MIL/uL (ref 3.87–5.11)
RDW: 15.1 % (ref 11.5–15.5)
WBC: 6.5 10*3/uL (ref 4.0–10.5)
nRBC: 0 % (ref 0.0–0.2)

## 2019-10-09 MED ORDER — SODIUM CHLORIDE 0.9 % IV SOLN
Freq: Once | INTRAVENOUS | Status: AC
  Administered 2019-10-09: 13:00:00 via INTRAVENOUS
  Filled 2019-10-09: qty 250

## 2019-10-09 MED ORDER — DIPHENHYDRAMINE HCL 25 MG PO CAPS
25.0000 mg | ORAL_CAPSULE | Freq: Once | ORAL | Status: AC
  Administered 2019-10-09: 25 mg via ORAL

## 2019-10-09 MED ORDER — HEPARIN SOD (PORK) LOCK FLUSH 100 UNIT/ML IV SOLN
500.0000 [IU] | Freq: Once | INTRAVENOUS | Status: AC | PRN
  Administered 2019-10-09: 500 [IU]
  Filled 2019-10-09: qty 5

## 2019-10-09 MED ORDER — ACETAMINOPHEN 325 MG PO TABS
650.0000 mg | ORAL_TABLET | Freq: Once | ORAL | Status: AC
  Administered 2019-10-09: 13:00:00 650 mg via ORAL

## 2019-10-09 MED ORDER — PROCHLORPERAZINE MALEATE 10 MG PO TABS
10.0000 mg | ORAL_TABLET | Freq: Once | ORAL | Status: AC
  Administered 2019-10-09: 10 mg via ORAL

## 2019-10-09 MED ORDER — SODIUM CHLORIDE 0.9% FLUSH
10.0000 mL | Freq: Once | INTRAVENOUS | Status: AC
  Administered 2019-10-09: 12:00:00 10 mL
  Filled 2019-10-09: qty 10

## 2019-10-09 MED ORDER — SODIUM CHLORIDE 0.9 % IV SOLN
Freq: Once | INTRAVENOUS | Status: DC
  Filled 2019-10-09: qty 250

## 2019-10-09 MED ORDER — FAMOTIDINE 20 MG PO TABS
ORAL_TABLET | ORAL | Status: AC
  Filled 2019-10-09: qty 1

## 2019-10-09 MED ORDER — DIPHENHYDRAMINE HCL 25 MG PO CAPS
ORAL_CAPSULE | ORAL | Status: AC
  Filled 2019-10-09: qty 1

## 2019-10-09 MED ORDER — DEXAMETHASONE 4 MG PO TABS
20.0000 mg | ORAL_TABLET | Freq: Once | ORAL | Status: AC
  Administered 2019-10-09: 20 mg via ORAL

## 2019-10-09 MED ORDER — ACETAMINOPHEN 325 MG PO TABS
ORAL_TABLET | ORAL | Status: AC
  Filled 2019-10-09: qty 2

## 2019-10-09 MED ORDER — PROCHLORPERAZINE MALEATE 10 MG PO TABS
ORAL_TABLET | ORAL | Status: AC
  Filled 2019-10-09: qty 1

## 2019-10-09 MED ORDER — SODIUM CHLORIDE 0.9% FLUSH
10.0000 mL | INTRAVENOUS | Status: DC | PRN
  Administered 2019-10-09: 10 mL
  Filled 2019-10-09: qty 10

## 2019-10-09 MED ORDER — DEXTROSE 5 % IV SOLN
55.0000 mg/m2 | Freq: Once | INTRAVENOUS | Status: AC
  Administered 2019-10-09: 100 mg via INTRAVENOUS
  Filled 2019-10-09: qty 30

## 2019-10-09 MED ORDER — FAMOTIDINE 20 MG PO TABS
20.0000 mg | ORAL_TABLET | Freq: Once | ORAL | Status: AC
  Administered 2019-10-09: 20 mg via ORAL

## 2019-10-09 MED ORDER — DEXAMETHASONE 4 MG PO TABS
ORAL_TABLET | ORAL | Status: AC
  Filled 2019-10-09: qty 5

## 2019-10-09 NOTE — Progress Notes (Signed)
Confirmed dose increase of carfilzomib to 56 mg/m2 today and for the next few doses. He will consider dose increase to 70 mg/m2 in the future pending tolerability.  Demetrius Charity, PharmD, [facility] Phone: [phone] 10/09/2019

## 2019-10-09 NOTE — Patient Instructions (Addendum)
[facility] Discharge Instructions for Patients Receiving Chemotherapy  Today you received the following chemotherapy agents Carfilzomib (KYPROLIS).  To help prevent nausea and vomiting after your treatment, we encourage you to take your nausea medication as prescribed.  If you develop nausea and vomiting that is not controlled by your nausea medication, call the clinic.   BELOW ARE SYMPTOMS THAT SHOULD BE REPORTED IMMEDIATELY:  *FEVER GREATER THAN 100.5 F  *CHILLS WITH OR WITHOUT FEVER  NAUSEA AND VOMITING THAT IS NOT CONTROLLED WITH YOUR NAUSEA MEDICATION  *UNUSUAL SHORTNESS OF BREATH  *UNUSUAL BRUISING OR BLEEDING  TENDERNESS IN MOUTH AND THROAT WITH OR WITHOUT PRESENCE OF ULCERS  *URINARY PROBLEMS  *BOWEL PROBLEMS  UNUSUAL RASH Items with * indicate a potential emergency and should be followed up as soon as possible.  Feel free to call the clinic should you have any questions or concerns. The clinic phone number is [phone].  Please show the CHEMO ALERT CARD at check-in to the Emergency Department and triage nurse.   

## 2019-10-09 NOTE — Progress Notes (Signed)
HEMATOLOGY/ONCOLOGY CLINIC NOTE  Date of Service: 10/09/2019  Patient Care Team: Hoyt Koch, MD as PCP - General (Internal Medicine) Lafayette Dragon, MD (Inactive) as Consulting Physician (Gastroenterology) Megan Salon, MD as Consulting Physician (Gynecology) Melrose Nakayama, MD as Consulting Physician (Orthopedic Surgery) Deneise Lever, MD as Consulting Physician (Pulmonary Disease) [name], MD (Ophthalmology)  CHIEF COMPLAINTS/PURPOSE OF CONSULTATION:  Continue mx of myeloma  HISTORY OF PRESENTING ILLNESS:   Faith Orr is a wonderful 78 y.o. female who has been referred to [name] by Dr. Pricilla Holm for evaluation and management of Lytic bone lesions. She is accompanied today by her son in law, and her partner is present via phone. The pt reports that she is doing well overall.   The pt notes that 2-3 months ago while standing at the stove, and otherwise feeling normally, she felt "something snap that took her breath away" in her mid back as she stretched to get something. The pt notes that she saw a chiropractor twice due to her back stiffness, which did not help. The pt then described her new bone pains to her PCP on 12/05/18, and subsequent imaging, as noted below, revealed concerns for numerous bone lesions. She has begun 24m Fosamax. The pt notes that she had hip pain in 2018, and that an XR at that time did not reveal any lesions.  The pt notes that most of her pain is concentrated to her left shoulder presently, and with minimal movement of the arm. She endorses pain radiating into her left arm and notes that her hand is swollen in the mornings when she wakes up. She also endorses present back pain and right hip pain, worse when she walks. She has not yet seen orthopedics. The pt reports that she is needing to take 6051mAdvil every 4-6 hours as Tramadol alone has not been able to alleviate her pain. The pt denies any unexpected weight loss, fevers,  chills, or night sweats. The pt notes that her urine is a very dark color presently, but denies overt blood in the urine, underpants, nor tissue paper. The pt notes that in the last two weeks she has had some soreness in her head, but denies new headaches or changes in vision. The pt notes that she has been urinating more frequently overall, but has been trying to stay better hydrated as well.  The pt notes that she has been compliant with annual mammograms.   The pt notes that she had a cyst in her right breast which was removed in the past. She fractured her left wrist in 2008 after falling down stairs. The pt endorses history of fatty liver.   The pt denies ever smoking cigarettes and endorses significant second hand smoke exposure with a previous marriage.   Of note prior to the patient's visit today, pt has had a Bone Scan completed on 12/13/18 with results revealing Multifocal uptake throughout the skeleton, consistent with diffuse metastatic disease. Primary tumor is not specified. 2. Uptake in the proximal right femur, consistent with lytic lesions. 3. Uptake in the ribs bilaterally as described. 4. Lesions in the proximal left humerus. 5. Diffuse uptake throughout the skull consistent with metastatic disease. 6. Right paramedian uptake at the manubrium.  Most recent lab results (12/08/18) of CBC w/diff and CMP is as follows: all values are WNL except for Glucose at 279, BUN at 24, AST at 41, ALT at 46. 12/08/18 SPEP revealed all values WNL except for Total  Protein at 6.0, Albumin at 3.6, Gamma globulin at 0.7, and M spike at 0.5g  On review of systems, pt reports significant left shoulder pain, back pain, right hip pain, dark urine, and denies fevers, chills, night sweats, unexpected weight loss, changes in bowel habits, changes in breathing, cough, new respiratory symptoms, changes in vision, abdominal pains, leg swelling, and any other symptoms.   On PMHx the pt reports fatty liver, and  denies blood clots.  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 Denies ever smoking.  On Family Hx the pt reports maternal grandmother with colon cancer. Father with bladder cancer and amyloidosis (pt notes that this could have been misdiagnosis). Mother with Protein S deficiency and polymyalgia rheumatica.  Interval History:   Faith Orr returns today for management and evaluation of her Multiple Myeloma and C9D15 of maintenance Carfilzomib, Revlimid, and Dexamethasone. The patient's last visit with [name] was on 09/25/2019. The pt reports that she is doing well overall.  The pt reports that she has no issues with the increased dosage of Carfizomib and has no new concerns.   Lab results today (10/09/19) of CBC w/diff and CMP is as follows: all values are WNL except for Mono Abs at 1.1K, Abs Immature Granulocytes at 0.12K, CO2 at 20, Glucose at 219, Total Protein at 6.3, Albumin at 3.3, AST at 12.  On review of systems, pt denies new bone pains, abdominal pain, leg swelling and any other symptoms.    MEDICAL HISTORY:  Past Medical History:  Diagnosis Date   Allergy    seasonal   Asthma    DEPRESSION    DIABETES MELLITUS, TYPE II    Diverticulosis    HYPERLIPIDEMIA    Macular degeneration of left eye    mild, Dr.Hecker   Obesity, unspecified    Osteoarthritis of both knees    OSTEOPENIA    Osteopenia    URINARY INCONTINENCE     SURGICAL HISTORY: Past Surgical History:  Procedure Laterality Date   CATARACT EXTRACTION Left 05/24/2018   CESAREAN SECTION  01/1973   CYSTOSCOPY/URETEROSCOPY/HOLMIUM LASER/STENT PLACEMENT Right 09/20/2019   Procedure: CYSTOSCOPY/URETEROSCOPY/HOLMIUM LASER/STENT PLACEMENT;  Surgeon: Lucas Mallow, MD;  Location: [facility];  Service: Urology;  Laterality: Right;   FRACTURE SURGERY     IR IMAGING GUIDED PORT INSERTION  02/20/2019   left wrist surgery  2008   By Dr.  Latanya Maudlin   right ankle  1994    SOCIAL HISTORY: Social History   Socioeconomic History   Marital status: Married    Spouse name: Not on file   Number of children: 1   Years of education: Not on file   Highest education level: Not on file  Occupational History    Employer: [employer] resource strain: Patient refused   Food insecurity    Worry: Patient refused    Inability: Patient refused   Transportation needs    Medical: Patient refused    Non-medical: Patient refused  Tobacco Use   Smoking status: Never Smoker   Smokeless tobacco: Never Used   Tobacco comment: Lives with partner [name]) and son  Substance and Sexual Activity   Alcohol use: No    Alcohol/week: 0.0 standard drinks   Drug use: No   Sexual activity: Never    Partners: Female    Birth control/protection: Post-menopausal    Comment: Lives with female partner (annette hicks)  and 52 yo son  Lifestyle   Physical activity    Days per week: Patient refused    Minutes per session: Patient refused   Stress: Patient refused  Relationships   Social connections    Talks on phone: Patient refused    Gets together: Patient refused    Attends religious service: Patient refused    Active member of club or organization: Patient refused    Attends meetings of clubs or organizations: Patient refused    Relationship status: Patient refused   Intimate partner violence    Fear of current or ex partner: Patient refused    Emotionally abused: Patient refused    Physically abused: Patient refused    Forced sexual activity: Patient refused  Other Topics Concern   Not on file  Social History Narrative   Not on file    FAMILY HISTORY: Family History  Problem Relation Age of Onset   Diabetes Father    Hyperlipidemia Father    Heart disease Father    Cancer Father    Hypertension Father    Colon cancer Paternal Grandmother 19   Osteoporosis  Mother    Protein S deficiency Mother    Hyperlipidemia Mother    Multiple sclerosis Daughter    Cancer Other        bladder   Breast cancer Neg Hx     ALLERGIES:  is allergic to penicillins; aleve [naproxen sodium]; and sulfonamide derivatives.  MEDICATIONS:  Current Outpatient Medications  Medication Sig Dispense Refill   acyclovir (ZOVIRAX) 400 MG tablet TAKE 1 TABLET(400 MG) BY MOUTH TWICE DAILY 60 tablet 0   aspirin EC 81 MG tablet Take 81 mg by mouth daily after breakfast.      Blood Glucose Monitoring Suppl (FREESTYLE FREEDOM LITE) W/DEVICE KIT Use to check blood sugars twice a day Dx 250.00 1 each 0   Calcium Carbonate-Vitamin D (CALCIUM 600+D HIGH POTENCY) 600-400 MG-UNIT per tablet Take 1 tablet by mouth 2 (two) times daily.      Cetirizine HCl 10 MG CAPS Take 1 capsule (10 mg total) by mouth daily. 30 capsule 1   dexamethasone (DECADRON) 4 MG tablet Take 5 tablets (20 mg total) by mouth once a week. On D22 of each cycle of treatment 20 tablet 5   docusate sodium (DOK) 100 MG capsule Take 2 capsules (200 mg total) by mouth at bedtime. 60 capsule 1   fentaNYL (DURAGESIC) 12 MCG/HR Place 1 patch onto the skin every 3 (three) days. 10 patch 0   fluticasone (FLONASE) 50 MCG/ACT nasal spray Place 1 spray into both nostrils daily. (Patient taking differently: Place 1 spray into both nostrils daily as needed for allergies or rhinitis. ) 16 g 2   glipiZIDE (GLUCOTROL XL) 5 MG 24 hr tablet TAKE 1 TABLET(5 MG) BY MOUTH DAILY WITH BREAKFAST 90 tablet 1   glucose blood (FREESTYLE LITE) test strip CHECK BLOOD SUGAR TWICE DAILY AS DIRECTED Dx 250.00 180 each 3   Lancets (FREESTYLE) lancets Use twice daily to check sugars. 100 each 11   lenalidomide (REVLIMID) 15 MG capsule TAKE 1 CAPSULE BY MOUTH  DAILY TAKE FOR 21 DAYS ON,  7 DAYS OFF, REPEAT EVERY 28 DAYS 21 capsule 0   lidocaine-prilocaine (EMLA) cream APPLY 1 APPLICATION TO THE AFFECTED AREA AS NEEDED. USE PRIOR TO  PORT ACCESS 30 g 0   LORazepam (ATIVAN) 0.5 MG tablet Take 1 tablet (0.5 mg total) by mouth every 8 (eight) hours as needed for anxiety (significant  essential tremors). (Patient not taking: Reported on 09/25/2019) 60 tablet 0   metFORMIN (GLUCOPHAGE-XR) 500 MG 24 hr tablet TAKE 3 TABLETS(1500 MG) BY MOUTH DAILY WITH BREAKFAST (Patient taking differently: Take 1,000 mg by mouth daily. ) 270 tablet 1   Multiple Vitamins-Minerals (ICAPS) CAPS Take 1 capsule by mouth daily after breakfast.      ondansetron (ZOFRAN) 8 MG tablet Take 1 tablet (8 mg total) by mouth 2 (two) times daily as needed (Nausea or vomiting). 30 tablet 1   Oxycodone HCl 10 MG TABS Take 1 tablet (10 mg total) by mouth every 6 (six) hours as needed. 90 tablet 0   pantoprazole (PROTONIX) 20 MG tablet TAKE 1 TABLET(20 MG) BY MOUTH DAILY 30 tablet 5   polyethylene glycol (MIRALAX / GLYCOLAX) packet Take 17 g by mouth daily after breakfast.      prochlorperazine (COMPAZINE) 10 MG tablet Take 1 tablet (10 mg total) by mouth every 6 (six) hours as needed (Nausea or vomiting). 30 tablet 1   sertraline (ZOLOFT) 50 MG tablet TAKE 1 TABLET BY MOUTH DAILY 90 tablet 1   simvastatin (ZOCOR) 20 MG tablet TAKE 1 TABLET(20 MG) BY MOUTH DAILY 90 tablet 1   Triamcinolone Acetonide 0.025 % LOTN Apply 1 application topically 3 (three) times daily as needed (rash/itching). 60 mL 2   Vitamin D, Ergocalciferol, (DRISDOL) 1.25 MG (50000 UT) CAPS capsule TAKE 1 CAPSULE BY MOUTH EVERY 7 DAYS 12 capsule 0   No current facility-administered medications for this visit.    Facility-Administered Medications Ordered in Other Visits  Medication Dose Route Frequency Provider Last Rate Last Dose   heparin lock flush 100 unit/mL  500 Units Intracatheter Once PRN Brunetta Genera, MD       sodium chloride flush (NS) 0.9 % injection 10 mL  10 mL Intracatheter PRN Brunetta Genera, MD   10 mL at 10/02/19 1524    REVIEW OF SYSTEMS:   A 10+ POINT  REVIEW OF SYSTEMS WAS OBTAINED including neurology, dermatology, psychiatry, cardiac, respiratory, lymph, extremities, GI, GU, Musculoskeletal, constitutional, breasts, reproductive, HEENT.  All pertinent positives are noted in the HPI.  All others are negative.   PHYSICAL EXAMINATION: ECOG PERFORMANCE STATUS: 2 - Symptomatic, <50% confined to bed  GENERAL:alert, in no acute distress and comfortable SKIN: no acute rashes, no significant lesions EYES: conjunctiva are pink and non-injected, sclera anicteric OROPHARYNX: MMM, no exudates, no oropharyngeal erythema or ulceration NECK: supple, no JVD LYMPH:  no palpable lymphadenopathy in the cervical, axillary or inguinal regions LUNGS: clear to auscultation b/l with normal respiratory effort HEART: regular rate & rhythm ABDOMEN:  normoactive bowel sounds , non tender, not distended. No palpable hepatosplenomegaly.  Extremity: no pedal edema PSYCH: alert & oriented x 3 with fluent speech NEURO: no focal motor/sensory deficits  LABORATORY DATA:  I have reviewed the data as listed  . CBC Latest Ref Rng & Units 10/02/2019 09/25/2019 09/20/2019  WBC 4.0 - 10.5 K/uL 6.1 7.4 -  Hemoglobin 12.0 - 15.0 g/dL 12.1 13.2 12.9  Hematocrit 36.0 - 46.0 % 36.1 39.1 38.0  Platelets 150 - 400 K/uL 161 226 -   . CBC    Component Value Date/Time   WBC 6.1 10/02/2019 1216   RBC 3.82 (L) 10/02/2019 1216   HGB 12.1 10/02/2019 1216   HGB 12.8 02/16/2019 1138   HCT 36.1 10/02/2019 1216   PLT 161 10/02/2019 1216   PLT 170 02/16/2019 1138   MCV 94.5 10/02/2019 1216   MCH  31.7 10/02/2019 1216   MCHC 33.5 10/02/2019 1216   RDW 14.6 10/02/2019 1216   LYMPHSABS 1.4 10/02/2019 1216   MONOABS 1.5 (H) 10/02/2019 1216   EOSABS 0.1 10/02/2019 1216   BASOSABS 0.0 10/02/2019 1216    . CMP Latest Ref Rng & Units 10/02/2019 09/25/2019 09/20/2019  Glucose 70 - 99 mg/dL 191(H) 244(H) 181(H)  BUN 8 - 23 mg/dL '12 14 12  ' Creatinine 0.44 - 1.00 mg/dL 0.75 0.81 0.60    Sodium 135 - 145 mmol/L 137 137 139  Potassium 3.5 - 5.1 mmol/L 3.3(L) 3.4(L) 3.5  Chloride 98 - 111 mmol/L 104 103 107  CO2 22 - 32 mmol/L 19(L) 20(L) -  Calcium 8.9 - 10.3 mg/dL 8.7(L) 9.5 -  Total Protein 6.5 - 8.1 g/dL 5.9(L) 6.4(L) -  Total Bilirubin 0.3 - 1.2 mg/dL 0.6 0.7 -  Alkaline Phos 38 - 126 U/L 74 80 -  AST 15 - 41 U/L 11(L) 12(L) -  ALT 0 - 44 U/L 16 17 -   09/18/2019 BM Bx Report ([id number])   09/18/2019 FISH Panel    05/30/2019 BM Bx   01/06/2019 BM Bx:     01/06/19 Cytogenetics:      05/30/19 BM Biopsy:   09/18/2019 FISH Panel    09/18/2019 BM Surgical Pathology [phone])     RADIOGRAPHIC STUDIES: I have personally reviewed the radiological images as listed and agreed with the findings in the report. Nm Pet Image Restage (ps) Whole Body  Result Date: 09/14/2019 CLINICAL DATA:  Subsequent treatment strategy for multiple myeloma. EXAM: NUCLEAR MEDICINE PET WHOLE BODY TECHNIQUE: 8.5 mCi F-18 FDG was injected intravenously. Full-ring PET imaging was performed from the skull base to thigh after the radiotracer. CT data was obtained and used for attenuation correction and anatomic localization. Fasting blood glucose: 140 mg/dl COMPARISON:  Multiple exams, including 05/31/2019 FINDINGS: Mediastinal blood pool activity: SUV max 2.4 HEAD/NECK: Hypermetabolic nodule in the left parapharyngeal space measures 1.2 by 1.0 cm with maximum SUV 12.3, previously 1.3 by 1.1 cm with maximum SUV 12.5. Subtle accentuated activity along the tongue base is relatively symmetric. Hypermetabolic left level IB lymph node measures 0.5 cm in short axis on image 62/4 (formerly 0.3 cm) with maximum SUV 4.8 (formerly 4.7). Incidental CT findings: none CHEST:At the site of the left lower lobe nodule there is currently more bandlike thickening measuring 0.8 by 2.1 cm, previously measuring up to 1.2 cm in thickness, with maximum SUV in this vicinity 1.9. Incidental CT findings:  Coronary, aortic arch, and branch vessel atherosclerotic vascular disease. Old granulomatous disease. 5 mm subpleural nodule on image 32/8 not readily seen previously, merit surveillance but not appreciably hypermetabolic currently. This is below sensitive PET-CT size thresholds. ABDOMEN/PELVIS: Considerable physiologic activity in bowel. Small hypermetabolic left inguinal lymph nodes, the most notable being a 0.7 cm left inguinal node on image 204/4 (formerly 0.5 cm) with maximum SUV 6.4 (previously 0.6). Along the left perineum there is indistinctly marginated subcutaneous stranding measuring about 2.6 by 1.1 cm on image 221/4, maximum SUV 12.5. This was not present previously. Incidental CT findings: Mild right hydronephrosis due to a 7 mm right UPJ calculus. 2 mm right kidney upper pole nonobstructive renal calculus. Photopenic 1.2 cm hypodense lesion in the dome of the right hepatic lobe on image 109/4. Old granulomatous disease of the spleen. Aortoiliac atherosclerotic vascular disease. Prominent stool throughout the colon favors constipation. SKELETON: Foci of accentuated activity in bilateral ribs associated with old rib fractures. Widespread tiny lytic  lesions in the skeleton are observed, many less than 3 mm in size. There are also larger lytic lesions, including a left inferior pubic ramus lesion measuring 1.2 by 0.5 cm on image 204/4 with maximum SUV 1.6 (formerly 2.3). A lytic lesion posteriorly in the right proximal femoral cortex measures 1.3 by 1.2 cm on image 222/4, with maximum SUV in this vicinity 2.7 which is similar to the unaffected contralateral side. A lytic lesion of the left T9 vertebral body measures 2.8 by 1.7 cm on image 111/4, with maximum SUV of 4.5, previously 4.7. There is some localized sclerosis in the left proximal humerus with associated cortical thickening and maximum SUV 4.8, previously 3.5. Incidental CT findings: Postoperative findings in the right ankle. EXTREMITIES: Focus  of right antecubital activity is thought to be injection related. Incidental CT findings: none IMPRESSION: 1. There widespread tiny lytic lesions compatible with multiple myeloma. Index larger lesions are generally similar to the prior exam, with low-grade activity such as the left T9 vertebral body lesion with maximum SUV 4.5. Is mild increase in the activity associated with a mildly sclerotic left proximal humeral lesion, maximum SUV 4.8 (previously 3.5). 2. At the site of the prior left lower lobe nodule is currently more bandlike thickening, with maximum SUV only 1.9, probably benign, continued surveillance of this region suggested. 3. There several small but hypermetabolic lymph nodes. This includes a left parapharyngeal space node measuring 1.0 cm with maximum SUV 12.3 (stable); a left level IB lymph node measuring 0.5 cm with maximum SUV 4.8 (slightly larger than prior); and a left inguinal lymph node measuring 0.7 cm in short axis with maximum SUV 6.4 (previously 0.5 cm with maximum SUV 0.6). Significance of these lymph nodes uncertain, surveillance is recommended. 4. New 5 mm left lower lobe subpleural nodule on image 32/8, not appreciably hypermetabolic, surveillance suggested. 5. Focal subcutaneous stranding along the left perineum measuring about 2.6 by 1.1 cm on image 221/4, maximum SUV 12.5. This was not present previously and is most likely inflammatory, although given the notable SUV, surveillance of this region is suggested. 6. Other imaging findings of potential clinical significance: Aortic Atherosclerosis (ICD10-I70.0). Coronary atherosclerosis. Old granulomatous disease. Mild right hydronephrosis due to a 7 mm right UPJ calculus. 2 mm right kidney upper pole nonobstructive renal calculus. Prominent stool throughout the colon favors constipation. Electronically Signed   By: Van Clines M.D.   On: 09/14/2019 14:32   Ct Biopsy  Result Date: 09/18/2019 INDICATION: History of multiple  myeloma. Please perform CT-guided bone marrow biopsy to evaluate for response to treatment. EXAM: CT-GUIDED BONE MARROW BIOPSY AND ASPIRATION MEDICATIONS: None ANESTHESIA/SEDATION: Fentanyl 100 mcg IV; Versed 2 mg IV Sedation Time: 10 Minutes; The patient was continuously monitored during the procedure by the interventional radiology nurse under my direct supervision. COMPLICATIONS: None immediate. PROCEDURE: Informed consent was obtained from the patient following an explanation of the procedure, risks, benefits and alternatives. The patient understands, agrees and consents for the procedure. All questions were addressed. A time out was performed prior to the initiation of the procedure. The patient was positioned prone and non-contrast localization CT was performed of the pelvis to demonstrate the iliac marrow spaces. The operative site was prepped and draped in the usual sterile fashion. Under sterile conditions and local anesthesia, a 22 gauge spinal needle was utilized for procedural planning. Next, an 11 gauge coaxial bone biopsy needle was advanced into the left iliac marrow space. Needle position was confirmed with CT imaging. Initially, bone marrow  aspiration was performed. Next, a bone marrow biopsy was obtained with the 11 gauge outer bone marrow device. Samples were prepared with the cytotechnologist and deemed adequate. The needle was removed intact. Hemostasis was obtained with compression and a dressing was placed. The patient tolerated the procedure well without immediate post procedural complication. IMPRESSION: Successful CT guided left iliac bone marrow aspiration and core biopsy. Electronically Signed   By: Sandi Mariscal M.D.   On: 09/18/2019 12:35   Ct Bone Marrow Biopsy & Aspiration  Result Date: 09/18/2019 INDICATION: History of multiple myeloma. Please perform CT-guided bone marrow biopsy to evaluate for response to treatment. EXAM: CT-GUIDED BONE MARROW BIOPSY AND ASPIRATION MEDICATIONS:  None ANESTHESIA/SEDATION: Fentanyl 100 mcg IV; Versed 2 mg IV Sedation Time: 10 Minutes; The patient was continuously monitored during the procedure by the interventional radiology nurse under my direct supervision. COMPLICATIONS: None immediate. PROCEDURE: Informed consent was obtained from the patient following an explanation of the procedure, risks, benefits and alternatives. The patient understands, agrees and consents for the procedure. All questions were addressed. A time out was performed prior to the initiation of the procedure. The patient was positioned prone and non-contrast localization CT was performed of the pelvis to demonstrate the iliac marrow spaces. The operative site was prepped and draped in the usual sterile fashion. Under sterile conditions and local anesthesia, a 22 gauge spinal needle was utilized for procedural planning. Next, an 11 gauge coaxial bone biopsy needle was advanced into the left iliac marrow space. Needle position was confirmed with CT imaging. Initially, bone marrow aspiration was performed. Next, a bone marrow biopsy was obtained with the 11 gauge outer bone marrow device. Samples were prepared with the cytotechnologist and deemed adequate. The needle was removed intact. Hemostasis was obtained with compression and a dressing was placed. The patient tolerated the procedure well without immediate post procedural complication. IMPRESSION: Successful CT guided left iliac bone marrow aspiration and core biopsy. Electronically Signed   By: Sandi Mariscal M.D.   On: 09/18/2019 12:35    ASSESSMENT & PLAN:   78 y.o. female with  1. Recently diagnosed Multiple Myeloma, RISS Stage III  Labs upon initial presentation from 12/08/18, blood counts are normal including WBC at 7.1k, HGB at 13.1, and PLT at 245k. Calcium normal at 10.3. Creatinine normal at 0.63. M spike at 0.5g. 12/13/18 Bone Scan revealed Multifocal uptake throughout the skeleton, consistent with diffuse metastatic  disease. Primary tumor is not specified. 2. Uptake in the proximal right femur, consistent with lytic lesions. 3. Uptake in the ribs bilaterally as described. 4. Lesions in the proximal left humerus. 5. Diffuse uptake throughout the skull consistent with metastatic disease. 6. Right paramedian uptake at the manubrium.  12/13/18 CT Right Femur revealed Numerous lytic lesions involving the right femur and a lytic lesion in the left inferior pubic ramus. Overall appearance is most concerning for multiple myeloma  12/27/18 Pretreatment 24hour UPEP observed an M spike at 1m, and showed 1955mtotal protein/day.  12/27/18 Pretreatment MMP revealed M Protein at 0.5g with IgG Lambda specificity. Kappa:Lambda light chain ratio at 0.13, with Lambda at 40.3. There is less abnormal protein and light chains than I would expect from 30% plasma cells, which suggests hypo-secretory or non-secretory neoplastic plasma cells. Will have an impact in assessing response. 01/05/19 PET/CT revealed Innumerable lytic lesions in the skeleton compatible with myeloma. Most of the larger lesions are hypermetabolic, for example including a left proximal humeral shaft lesion with maximum SUV of 8.1 and  a 2.8 cm lesion in the left T9 vertebral body with maximum SUV 5.1. Most of the smaller lytic lesions, and some of the larger lesions, do not demonstrate accentuated metabolic activity. 2. 1.2 cm in short axis lymph node in the left parapharyngeal space is hypermetabolic with maximum SUV 11.8. I do not see a separate mass in the head and neck to give rise to this hypermetabolic lymph node. 3. Mosaic attenuation in the lower lobes, nonspecific possibly from air trapping. 4.  Aortic Atherosclerosis 5. Heterogeneous activity in the liver, making it hard to exclude small liver lesions. Consider hepatic protocol MRI with and without contrast for definitive assessment. Nonobstructive right nephrolithiasis. Old granulomatous disease  01/06/19 Bone  Marrow biopsy revealed interstitial increase in plasma cells (28% aspirate, 40% CD138 immunohistochemistry). Plasma cells negative for light chains consistent with a non or weakly secretory myeloma   01/06/19 Cytogenetics revealed 37% of cells with trisomy 11 or 11q deletion, and 40.5% of cells with 17p mutation  S/p 5 cycles of KRD treatment  05/31/19 BM Biopsy revealed mild atypical plasmacytosis at 5% with polytypic variation.   06/01/19 PET/CT revealed "Dominant lesion in the LEFT humerus is decreased significantly in metabolic activity. Additional hypermetabolic skeletal lytic lesions have decreased in metabolic activity or similar to comparison exam (01/05/2019). No evidence of disease progression. 2. Multiple additional lytic lesions do not have metabolic activity and unchanged. 3. No new skeletal lesions are identified. No soft tissue plasmacytoma identified. 4. Nodule / node in the LEFT parapharyngeal space which is intensely hypermetabolic not changed from prior. 5. New hypermetabolic LEFT lower lobe pulmonary nodule is indeterminate. Recommend close attention on follow-up 6. New obstructive hydronephrosis of the RIGHT kidney related to RIGHT UPJ stone."  09/18/2019 BM Bx Report which revealed "Slightly hypercellular bone marrow for age with trilineage hematopoiesis and 1% plasma cells."  09/14/2019 PET/CT Whole Body Scan ([id number]) which revealed "1. There widespread tiny lytic lesions compatible with multiple myeloma. Index larger lesions are generally similar to the prior exam, with low-grade activity such as the left T9 vertebral body lesion with maximum SUV 4.5. Is mild increase in the activity associated with a mildly sclerotic left proximal humeral lesion, maximum SUV 4.8 (previously 3.5). 2. At the site of the prior left lower lobe nodule is currently more bandlike thickening, with maximum SUV only 1.9, probably benign, continued surveillance of this region suggested. 3. There several  small but hypermetabolic lymph nodes. This includes a left parapharyngeal space node measuring 1.0 cm with maximum SUV 12.3 (stable); a left level IB lymph node measuring 0.5 cm with maximum SUV 4.8 (slightly larger than prior); and a left inguinal lymph node measuring 0.7 cm in short axis with maximum SUV 6.4 (previously 0.5 cm with maximum SUV 0.6). Significance of these lymph nodes uncertain, surveillance is recommended. 4. New 5 mm left lower lobe subpleural nodule on image 32/8, not appreciably hypermetabolic, surveillance suggested. 5. Focal subcutaneous stranding along the left perineum measuring about 2.6 by 1.1 cm on image 221/4, maximum SUV 12.5. This was not present previously and is most likely inflammatory, although given the notable SUV, surveillance of this region is suggested. 6. Other imaging findings of potential clinical significance: Aortic Atherosclerosis (ICD10-I70.0). Coronary atherosclerosis. Old granulomatous disease. Mild right hydronephrosis due to a 7 mm right UPJ calculus. 2 mm right kidney upper pole nonobstructive renal calculus. Prominent stool throughout the colon favors constipation."  2. Heterogeneous liver activity, as seen on 01/05/19 PET/CT Extra-medullary hematopoiesis vs metabolic liver disease  vs hepatic malignancy ?  01/17/19 MRI Liver revealed Several appreciable liver lesions all have benign imaging characteristics. No MRI findings of metastatic involvement of the liver. 2. Scattered bony lesions corresponding to the lytic lesions seen at PET-CT, compatible with active myeloma. 3. Aortic Atherosclerosis.  Mild cardiomegaly. 4. Diffuse hepatic steatosis.   3. Left lower lobe pulmonary nodule First seen on 06/01/19 PET/CT  PLAN: -Discussed pt labwork today, 10/09/19; all values are WNL except for Mono Abs at 1.1K, Abs Immature Granulocytes at 0.12K, CO2 at 20, Glucose at 219, Total Protein at 6.3, Albumin at 3.3, AST at 12. 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 -Will adjust regimen after this cycle  -Will continue maintenance until progression or intolerance  -Will have to continue to watch with scans  -Continue 13m Claritin or Zyrtec and 228mPepcid for mild rashes, will continue to watch these -Adjusted premedications to add Tylenol and will split dose of Dexamethasone with pre-meds and pt will not take Decadron at home except on D22 -Continue Fentanyl patch and 1055mxycodone for break through pain -Continue Zometa every 4 weeks -Continue Acyclovir and 39m75mpirin to mitigate risk of Shingles and blood clots respectively -Refill Acyclovir   -Will see back in a month with labs  FOLLOW UP: Switching to maintenance Carfilzomib from C10 (D1 and D15 only each cycle) . Please schedule C10 and C11 per orders. -Portflush and labs with each treatment -MD visit with C10D15 and C11D15  The total time spent in the appt was 15 minutes and more than 50% was on counseling and direct patient cares.  All of the patient's questions were answered with apparent satisfaction. The patient knows to call the clinic with any problems, questions or concerns.   GautSullivan LoneMS AGoldvilleIVMS SCH [facility] Center[facility]/Oncology Physician [facility] Campsuffice):       [phone] cell):  [phone]):           [phone]/26/2020 4:13 AM  I, JazzYevette Edwards acting as a scribe for Dr. GautSullivan LoneI have reviewed the above documentation for accuracy and completeness, and I agree with the above.  .GauBrunetta Genera                                                                                                                                                                                                                                                                                                                                                                                                                                                                                                                                                                                                                                                                                                                                         .  I have reviewed the above documentation for accuracy and completeness, and I agree with the above. Brunetta Genera MD

## 2019-10-09 NOTE — Patient Instructions (Signed)

## 2019-10-10 ENCOUNTER — Ambulatory Visit: Payer: Self-pay

## 2019-10-10 ENCOUNTER — Encounter (HOSPITAL_COMMUNITY): Payer: Self-pay | Admitting: Emergency Medicine

## 2019-10-10 ENCOUNTER — Ambulatory Visit (HOSPITAL_COMMUNITY)
Admission: EM | Admit: 2019-10-10 | Discharge: 2019-10-10 | Disposition: A | Discharge status: Home / Self Care | Payer: Medicare Other | Source: Home / Self Care

## 2019-10-10 ENCOUNTER — Telehealth: Payer: Self-pay | Admitting: Hematology

## 2019-10-10 ENCOUNTER — Encounter: Payer: Self-pay | Admitting: Hematology

## 2019-10-10 ENCOUNTER — Other Ambulatory Visit: Payer: Self-pay

## 2019-10-10 ENCOUNTER — Emergency Department (HOSPITAL_COMMUNITY): Payer: Medicare Other

## 2019-10-10 ENCOUNTER — Emergency Department (HOSPITAL_COMMUNITY)
Admission: EM | Admit: 2019-10-10 | Discharge: 2019-10-10 | Disposition: A | Discharge status: Home / Self Care | Payer: Medicare Other | Attending: Emergency Medicine | Admitting: Emergency Medicine

## 2019-10-10 ENCOUNTER — Ambulatory Visit: Payer: Medicare Other

## 2019-10-10 ENCOUNTER — Encounter: Payer: Self-pay | Admitting: *Deleted

## 2019-10-10 ENCOUNTER — Telehealth: Payer: Self-pay | Admitting: *Deleted

## 2019-10-10 DIAGNOSIS — Y939 Activity, unspecified: Secondary | ICD-10-CM | POA: Diagnosis not present

## 2019-10-10 DIAGNOSIS — Z79899 Other long term (current) drug therapy: Secondary | ICD-10-CM | POA: Diagnosis not present

## 2019-10-10 DIAGNOSIS — S29011A Strain of muscle and tendon of front wall of thorax, initial encounter: Secondary | ICD-10-CM | POA: Diagnosis not present

## 2019-10-10 DIAGNOSIS — Z7984 Long term (current) use of oral hypoglycemic drugs: Secondary | ICD-10-CM | POA: Diagnosis not present

## 2019-10-10 DIAGNOSIS — S0990XA Unspecified injury of head, initial encounter: Secondary | ICD-10-CM | POA: Diagnosis present

## 2019-10-10 DIAGNOSIS — Y999 Unspecified external cause status: Secondary | ICD-10-CM | POA: Diagnosis not present

## 2019-10-10 DIAGNOSIS — Y929 Unspecified place or not applicable: Secondary | ICD-10-CM | POA: Diagnosis not present

## 2019-10-10 DIAGNOSIS — S0001XA Abrasion of scalp, initial encounter: Secondary | ICD-10-CM | POA: Diagnosis not present

## 2019-10-10 DIAGNOSIS — E119 Type 2 diabetes mellitus without complications: Secondary | ICD-10-CM | POA: Diagnosis not present

## 2019-10-10 DIAGNOSIS — J45909 Unspecified asthma, uncomplicated: Secondary | ICD-10-CM | POA: Diagnosis not present

## 2019-10-10 DIAGNOSIS — W101XXA Fall (on)(from) sidewalk curb, initial encounter: Secondary | ICD-10-CM | POA: Insufficient documentation

## 2019-10-10 DIAGNOSIS — Z7982 Long term (current) use of aspirin: Secondary | ICD-10-CM | POA: Diagnosis not present

## 2019-10-10 NOTE — Telephone Encounter (Signed)
Scheduled appt per 10/26 los.  Per Md have lab,port, and f/u 11/19 and treatment 11/23.  Spoke with pt and she is aware of her scheduled appt date and time.

## 2019-10-10 NOTE — Discharge Instructions (Signed)
Recommend Tylenol, Motrin as needed for pain control.  Recommend recheck with your primary doctor.  If you develop further falls, difficulty breathing, abdominal pain or other new concerning symptom recommend return to ER for recheck.

## 2019-10-10 NOTE — Telephone Encounter (Signed)
Pt caled to report a fall that occurred yesterday at 5 pm. Pt stated her glasses fogged up from wearing a mask and she tripped over a speed bum. She has a 1 inch laceration to the right side of her forehead, moderate pain under her right breast and down the right side of her chest, and a superficial scratch to her right knee.  Pt denies weakness, headache, nausea and visual disturbances. Pt stated that the laceration is still oozing blood.  Pt is currently a [facility] at [facility] for multiple myeloma. Pt refused to go to ED, and wanted OV. Called Carson at [place] office and agreed that pt should be evaluated in the [facility] since she is not willing to go to the ED. Last tetanus per chart review is 04/18/2010. Pt advised to be NPO.  Pt stated she washed the cut and applied antibiotic ointment and a bandaid to the laceration.     Reason for Disposition . Skin is split open or gaping  (or length > 1/2 inch or 12 mm)  Answer Assessment - Initial Assessment Questions 1. MECHANISM: "How did the injury happen?" For falls, ask: "What height did you fall from?" and "What surface did you fall against?"      Tripped over the speed bump in parking lot -fell against asphalt 2. ONSET: "When did the injury happen?" (Minutes or hours ago)      5 pm 10/09/19 3. NEUROLOGIC SYMPTOMS: "Was there any loss of consciousness?" "Are there any other neurological symptoms?"      No-no- 4. MENTAL STATUS: "Does the person know who he is, who you are, and where he is?"      yes 5. LOCATION: "What part of the head was hit?"      Right side of foehead- still oozing a little bit 6. SCALP APPEARANCE: "What does the scalp look like? Is it bleeding now?" If so, ask: "Is it difficult to stop?"      No issues 7. SIZE: For cuts, bruises, or swelling, ask: "How large is it?" (e.g., inches or centimeters)      1 inch and superficial scratch right knee 8. PAIN: "Is there any pain?" If so, ask: "How bad is it?"  (e.g., Scale 1-10; or mild,  moderate, severe)    To the under the right breast to the bright- sore hurts with reaching feels better if supported in that are  9. TETANUS: For any breaks in the skin, ask: "When was the last tetanus booster?"    Pt does know 10. OTHER SYMPTOMS: "Do you have any other symptoms?" (e.g., neck pain, vomiting)       no 11. PREGNANCY: "Is there any chance you are pregnant?" "When was your last menstrual period?"       n/a  Protocols used: HEAD INJURY-A-AH

## 2019-10-10 NOTE — ED Triage Notes (Signed)
Pt reports was out yesterday when she tripped on sidewalk and fell. Denies LOC. Takes baby ASA. Has Band-Aid on forehead from abrasion from fall. C/o right rib cage pain from fall. Reports she is a cancer patient and her doctor wanted her to come to ED to be examined due to her brittle bones.

## 2019-10-10 NOTE — ED Provider Notes (Signed)
[place] DEPT Provider Note   CSN: [id number] Arrival date & time: 10/10/19  1322     History   Chief Complaint Chief Complaint  Patient presents with  . Fall  . Head Injury  . Chest Pain    right    HPI ANELIZ CARBARY is a 78 y.o. female.  Presents [facility] department after fall.  Patient states yesterday evening she tripped on a sidewalk, fell and landed on her right side, hitting her right head and right chest.  States that she has been able to walk and has not had any difficulty with this since the fall.  Has noted some pain over her right chest wall from the fall, has not had any associated difficulty breathing.  Case was discussed with her oncologist who she follows with for her multiple myeloma, recommended come to ER for eval.  Patient takes baby aspirin, no other anticoagulation.  No loss consciousness, no vomiting, nausea.     HPI  Past Medical History:  Diagnosis Date  . Allergy    seasonal  . Asthma   . DEPRESSION   . DIABETES MELLITUS, TYPE II   . Diverticulosis   . HYPERLIPIDEMIA   . Macular degeneration of left eye    mild, Dr.Hecker  . Obesity, unspecified   . Osteoarthritis of both knees   . OSTEOPENIA   . Osteopenia   . URINARY INCONTINENCE     Patient Active Problem List   Diagnosis Date Noted  . Port-A-Cath in place 05/04/2019  . Multiple myeloma ([place]) 01/16/2019  . Counseling regarding advance care planning and goals of care 01/16/2019  . Acute bilateral low back pain with right-sided sciatica 12/06/2018  . Fatigue 06/07/2018  . Elevated LFTs 06/07/2018  . Arthritis of carpometacarpal [facility]) joint of left thumb 08/02/2017  . Degenerative arthritis of knee, bilateral 03/09/2017  . Lumbar degenerative disc disease 03/09/2017  . Routine general medical examination at a health care facility 10/23/2015  . Snoring   . Allergic rhinitis 03/28/2013  . Reactive airway disease   . Mild major depression ([place]) 04/20/2010   . Osteopenia 04/20/2010  . Diabetes type 2, controlled ([place]) 04/18/2010  . Hyperlipidemia associated with type 2 diabetes mellitus ([place]) 04/18/2010  . Morbid obesity ([place]) 04/18/2010    Past Surgical History:  Procedure Laterality Date  . CATARACT EXTRACTION Left 05/24/2018  . CESAREAN SECTION  01/1973  . CYSTOSCOPY/URETEROSCOPY/HOLMIUM LASER/STENT PLACEMENT Right 09/20/2019   Procedure: CYSTOSCOPY/URETEROSCOPY/HOLMIUM LASER/STENT PLACEMENT;  Surgeon: Lucas Mallow, MD;  Location: [facility];  Service: Urology;  Laterality: Right;  . FRACTURE SURGERY    . IR IMAGING GUIDED PORT INSERTION  02/20/2019  . left wrist surgery  2008   By Dr. Latanya Maudlin  . right ankle  1994     OB History    Gravida  1   Para  1   Term      Preterm      AB      Living  1     SAB      TAB      Ectopic      Multiple      Live Births               Home Medications    Prior to Admission medications   Medication Sig Start Date End Date Taking? Authorizing Provider  acyclovir (ZOVIRAX) 400 MG tablet TAKE 1 TABLET(400 MG) BY MOUTH TWICE DAILY 09/19/19   Irene Limbo,  Cloria Spring, MD  aspirin EC 81 MG tablet Take 81 mg by mouth daily after breakfast.     [provider]  Blood Glucose Monitoring Suppl (FREESTYLE FREEDOM LITE) W/DEVICE KIT Use to check blood sugars twice a day Dx 250.00 06/01/14   Rowe Clack, MD  Calcium Carbonate-Vitamin D (CALCIUM 600+D HIGH POTENCY) 600-400 MG-UNIT per tablet Take 1 tablet by mouth 2 (two) times daily.     [provider]  Cetirizine HCl 10 MG CAPS Take 1 capsule (10 mg total) by mouth daily. 03/09/19   Tanner, Lyndon Code., PA-C  dexamethasone (DECADRON) 4 MG tablet Take 5 tablets (20 mg total) by mouth once a week. On D22 of each cycle of treatment 03/09/19   Brunetta Genera, MD  docusate sodium (DOK) 100 MG capsule Take 2 capsules (200 mg total) by mouth at bedtime. 10/02/19   Brunetta Genera, MD  fentaNYL  (DURAGESIC) 12 MCG/HR Place 1 patch onto the skin every 3 (three) days. 10/02/19   Brunetta Genera, MD  fluticasone (FLONASE) 50 MCG/ACT nasal spray Place 1 spray into both nostrils daily. Patient taking differently: Place 1 spray into both nostrils daily as needed for allergies or rhinitis.  03/09/18   Hoyt Koch, MD  glipiZIDE (GLUCOTROL XL) 5 MG 24 hr tablet TAKE 1 TABLET(5 MG) BY MOUTH DAILY WITH BREAKFAST 09/04/19   Hoyt Koch, MD  glucose blood (FREESTYLE LITE) test strip CHECK BLOOD SUGAR TWICE DAILY AS DIRECTED Dx 250.00 07/13/14   Rowe Clack, MD  Lancets (FREESTYLE) lancets Use twice daily to check sugars. 04/15/16   Hoyt Koch, MD  lenalidomide (REVLIMID) 15 MG capsule TAKE 1 CAPSULE BY MOUTH  DAILY TAKE FOR 21 DAYS ON,  7 DAYS OFF, REPEAT EVERY 28 DAYS 09/27/19   Brunetta Genera, MD  lidocaine-prilocaine (EMLA) cream APPLY 1 APPLICATION TO THE AFFECTED AREA AS NEEDED. USE PRIOR TO PORT ACCESS 05/08/19   Brunetta Genera, MD  LORazepam (ATIVAN) 0.5 MG tablet Take 1 tablet (0.5 mg total) by mouth every 8 (eight) hours as needed for anxiety (significant essential tremors). Patient not taking: Reported on 09/25/2019 04/27/19   Brunetta Genera, MD  metFORMIN (GLUCOPHAGE-XR) 500 MG 24 hr tablet TAKE 3 TABLETS(1500 MG) BY MOUTH DAILY WITH BREAKFAST Patient taking differently: Take 1,000 mg by mouth daily.  07/07/19   Hoyt Koch, MD  Multiple Vitamins-Minerals (ICAPS) CAPS Take 1 capsule by mouth daily after breakfast.     [provider]  ondansetron (ZOFRAN) 8 MG tablet Take 1 tablet (8 mg total) by mouth 2 (two) times daily as needed (Nausea or vomiting). 07/25/19   Brunetta Genera, MD  Oxycodone HCl 10 MG TABS Take 1 tablet (10 mg total) by mouth every 6 (six) hours as needed. 09/04/19   Brunetta Genera, MD  pantoprazole (PROTONIX) 20 MG tablet TAKE 1 TABLET(20 MG) BY MOUTH DAILY 11/19/17   Hoyt Koch,  MD  polyethylene glycol (MIRALAX / GLYCOLAX) packet Take 17 g by mouth daily after breakfast.     [provider]  prochlorperazine (COMPAZINE) 10 MG tablet Take 1 tablet (10 mg total) by mouth every 6 (six) hours as needed (Nausea or vomiting). 01/16/19   Brunetta Genera, MD  sertraline (ZOLOFT) 50 MG tablet TAKE 1 TABLET BY MOUTH DAILY 05/01/19   Hoyt Koch, MD  simvastatin (ZOCOR) 20 MG tablet TAKE 1 TABLET(20 MG) BY MOUTH DAILY 05/01/19   Hoyt Koch,  MD  Triamcinolone Acetonide 0.025 % LOTN Apply 1 application topically 3 (three) times daily as needed (rash/itching). 03/09/19   Harle Stanford., PA-C  Vitamin D, Ergocalciferol, (DRISDOL) 1.25 MG (50000 UT) CAPS capsule TAKE 1 CAPSULE BY MOUTH EVERY 7 DAYS 08/10/19   Hoyt Koch, MD    Family History Family History  Problem Relation Age of Onset  . Diabetes Father   . Hyperlipidemia Father   . Heart disease Father   . Cancer Father   . Hypertension Father   . Colon cancer Paternal Grandmother 19  . Osteoporosis Mother   . Protein S deficiency Mother   . Hyperlipidemia Mother   . Multiple sclerosis Daughter   . Cancer Other        bladder  . Breast cancer Neg Hx     Social History Social History   Tobacco Use  . Smoking status: Never Smoker  . Smokeless tobacco: Never Used  . Tobacco comment: Lives with partner [name]) and son  Substance Use Topics  . Alcohol use: No    Alcohol/week: 0.0 standard drinks  . Drug use: No     Allergies   Penicillins, Aleve [naproxen sodium], and Sulfonamide derivatives   Review of Systems Review of Systems  Constitutional: Negative for chills and fever.  HENT: Negative for ear pain and sore throat.   Eyes: Negative for pain and visual disturbance.  Respiratory: Negative for cough and shortness of breath.   Cardiovascular: Positive for chest pain. Negative for palpitations.  Gastrointestinal: Negative for abdominal pain and vomiting.   Genitourinary: Negative for dysuria and hematuria.  Musculoskeletal: Negative for arthralgias and back pain.  Skin: Negative for color change and rash.  Neurological: Positive for headaches. Negative for seizures and syncope.  All other systems reviewed and are negative.    Physical Exam Updated Vital Signs BP 126/80 (BP Location: Left Arm)   Pulse 82   Temp 98.5 F (36.9 C) (Oral)   Resp 18   LMP 10/09/2012   SpO2 98%   Physical Exam Vitals signs and nursing note reviewed.  Constitutional:      General: She is not in acute distress.    Appearance: She is well-developed.  HENT:     Head: Normocephalic.     Comments: Superficial abrasion, 2 cm, noted over right anterior forehead Eyes:     Conjunctiva/sclera: Conjunctivae normal.  Neck:     Musculoskeletal: Neck supple.  Cardiovascular:     Rate and Rhythm: Normal rate and regular rhythm.     Heart sounds: No murmur.  Pulmonary:     Effort: Pulmonary effort is normal. No respiratory distress.     Breath sounds: Normal breath sounds.  Chest:     Comments: TTP over right anterior chest wall, no crepitus, no deformity Abdominal:     Palpations: Abdomen is soft.     Tenderness: There is no abdominal tenderness.  Musculoskeletal:     Comments: No tenderness palpation in C, T, L-spine Extremities: no deformity, no edema, no TTP throughout  Skin:    General: Skin is warm and dry.  Neurological:     Mental Status: She is alert.      ED Treatments / Results  Labs (all labs ordered are listed, but only abnormal results are displayed) Labs Reviewed - No data to display  EKG None  Radiology Dg Ribs Unilateral W/chest Right  Result Date: 10/10/2019 CLINICAL DATA:  Pain following recent fall. History of multiple myeloma EXAM: RIGHT RIBS  AND CHEST - 3+ VIEW COMPARISON:  Chest radiograph February 04, 2019 FINDINGS: Frontal chest as well as oblique and cone-down rib images obtained. Port-A-Cath tip is in the superior  vena cava. No pneumothorax. There is scarring in the left lower lobe there is no edema or consolidation. Heart size and pulmonary vascularity are normal. No adenopathy. There is no evident pneumothorax or pleural effusion. There are old healed fractures of the right posterior fifth, sixth, and seventh ribs. There is no appreciable acute fracture. There is sclerosis in the proximal left humerus, incompletely visualized. IMPRESSION: Lytic lesions in the right scapula right humerus consistent with multiple [place]. Sclerosis in the proximal left humerus, incompletely visualized. Several prior rib fractures on the right with healing. No acute fracture evident. Stable scarring left lower lobe. No edema or consolidation. Stable cardiac silhouette. No pneumothorax. Port-A-Cath tip in superior vena cava. No pneumothorax. Electronically Signed   By: Lowella Grip III M.D.   On: 10/10/2019 14:27   Ct Head Wo Contrast  Result Date: 10/10/2019 CLINICAL DATA:  Fall yesterday. EXAM: CT HEAD WITHOUT CONTRAST TECHNIQUE: Contiguous axial images were obtained from the base of the skull through the vertex without intravenous contrast. COMPARISON:  None. FINDINGS: Brain: No evidence of acute infarction, hemorrhage, hydrocephalus, extra-axial collection or mass lesion/mass effect. Vascular: No hyperdense vessel or unexpected calcification. Skull: Negative for fracture or focal lesion. Sinuses/Orbits: No acute finding. Other: None. IMPRESSION: 1. No acute intracranial abnormality. Electronically Signed   By: Titus Dubin M.D.   On: 10/10/2019 14:30    Procedures Procedures (including critical care time)  Medications Ordered in ED Medications - No data to display   Initial Impression / Assessment and Plan / ED Course  I have reviewed the triage vital signs and the nursing notes.  Pertinent labs & imaging results that were available during my care of the patient were reviewed by me and considered in my medical decision  making (see chart for details).       79 year old lady who presents after fall yesterday.  Medical history for multiple myeloma.  On exam noted superficial abrasion over her right forehead, does not require repair.  CT head negative.  Also noted some tenderness over her right anterior chest wall, no obvious deformity or crepitus noted.  Chest x-ray negative.  Patient does not have any dyspnea, no hypoxia, is well-appearing.  No other physical exam abnormalities were identified.  Believe she is appropriate for discharge and outpatient at this time, reviewed return precautions, will discharge home.    After the discussed management above, the patient was determined to be safe for discharge.  The patient was in agreement with this plan and all questions regarding their care were answered.  ED return precautions were discussed and the patient will return to the ED with any significant worsening of condition.    Final Clinical Impressions(s) / ED Diagnoses   Final diagnoses:  Injury of head, initial encounter  Abrasion of scalp, initial encounter  Muscle strain of chest wall, initial encounter    ED Discharge Orders    None       Lucrezia Starch, MD 10/10/19 1645

## 2019-10-10 NOTE — Telephone Encounter (Signed)
Contacted patient following Dr.Kale's review of MyChart message sent 11:57 PM on 10/26. Patient states she fell yesterday after noon around 5pm. She had just stepped out of car, glasses fogged and she tripped when her shoe caught in a crack in the pavement. She has an abrasion on her forehead - she cleaned and applied antibiotic ointment and covered with a Band-Aid. She states that her head does not hurt except at the site of the abrasion, denies headache. She said she tucked her right arm close to her at the time of the fall to protect her right breast. She states she is sore. Information shared with Dr.Kale. Per Dr. Irene Limbo, advised patient of the following: she should be evaluated by her PCP if they have means to complete a full evaluation, otherwise should go to an Urgent Care for evaluation. Patient verbalized understanding and said she would update Dr.Kale's office.

## 2019-10-10 NOTE — Telephone Encounter (Signed)
Ok, or probably could have had office here

## 2019-10-15 ENCOUNTER — Encounter: Payer: Self-pay | Admitting: Hematology

## 2019-10-16 ENCOUNTER — Other Ambulatory Visit: Payer: Self-pay | Admitting: *Deleted

## 2019-10-16 DIAGNOSIS — C9000 Multiple myeloma not having achieved remission: Secondary | ICD-10-CM

## 2019-10-16 DIAGNOSIS — Z7189 Other specified counseling: Secondary | ICD-10-CM

## 2019-10-16 MED ORDER — ACYCLOVIR 400 MG PO TABS: ORAL_TABLET | ORAL | 0 refills | Status: DC

## 2019-10-16 NOTE — Telephone Encounter (Signed)
Acyclovir refilled per Dr.Kale OV note 10/09/2019

## 2019-10-22 ENCOUNTER — Other Ambulatory Visit: Payer: Self-pay | Admitting: Hematology

## 2019-10-22 ENCOUNTER — Encounter: Payer: Self-pay | Admitting: Hematology

## 2019-10-22 DIAGNOSIS — C9000 Multiple myeloma not having achieved remission: Secondary | ICD-10-CM

## 2019-10-23 ENCOUNTER — Other Ambulatory Visit: Payer: Self-pay

## 2019-10-23 ENCOUNTER — Inpatient Hospital Stay: Payer: Medicare Other

## 2019-10-23 ENCOUNTER — Ambulatory Visit: Payer: Medicare Other

## 2019-10-23 ENCOUNTER — Inpatient Hospital Stay: Payer: Medicare Other | Attending: Hematology

## 2019-10-23 VITALS — BP 133/74 | HR 94 | Temp 98.3°F | Resp 18

## 2019-10-23 DIAGNOSIS — C9000 Multiple myeloma not having achieved remission: Secondary | ICD-10-CM | POA: Diagnosis present

## 2019-10-23 DIAGNOSIS — Z5112 Encounter for antineoplastic immunotherapy: Secondary | ICD-10-CM | POA: Diagnosis present

## 2019-10-23 DIAGNOSIS — Z95828 Presence of other vascular implants and grafts: Secondary | ICD-10-CM

## 2019-10-23 DIAGNOSIS — Z7189 Other specified counseling: Secondary | ICD-10-CM

## 2019-10-23 LAB — CMP (CANCER CENTER ONLY)
ALT: 19 U/L (ref 0–44)
AST: 14 U/L — ABNORMAL LOW (ref 15–41)
Albumin: 3.4 g/dL — ABNORMAL LOW (ref 3.5–5.0)
Alkaline Phosphatase: 87 U/L (ref 38–126)
Anion gap: 14 (ref 5–15)
BUN: 11 mg/dL (ref 8–23)
CO2: 22 mmol/L (ref 22–32)
Calcium: 9.3 mg/dL (ref 8.9–10.3)
Chloride: 105 mmol/L (ref 98–111)
Creatinine: 0.76 mg/dL (ref 0.44–1.00)
GFR, Est AFR Am: >60 mL/min (ref >60)
GFR, Estimated: >60 mL/min (ref >60)
Glucose, Bld: 222 mg/dL — ABNORMAL HIGH (ref 70–99)
Potassium: 3.5 mmol/L (ref 3.5–5.1)
Sodium: 141 mmol/L (ref 135–145)
Total Bilirubin: 0.7 mg/dL (ref 0.3–1.2)
Total Protein: 6.3 g/dL — ABNORMAL LOW (ref 6.5–8.1)

## 2019-10-23 LAB — CBC WITH DIFFERENTIAL/PLATELET
Abs Immature Granulocytes: 0.05 10*3/uL (ref 0.00–0.07)
Basophils Absolute: 0.1 10*3/uL (ref 0.0–0.1)
Basophils Relative: 2 %
Eosinophils Absolute: 0.3 10*3/uL (ref 0.0–0.5)
Eosinophils Relative: 6 %
HCT: 36.3 % (ref 36.0–46.0)
Hemoglobin: 12.3 g/dL (ref 12.0–15.0)
Immature Granulocytes: 1 %
Lymphocytes Relative: 24 %
Lymphs Abs: 1.3 10*3/uL (ref 0.7–4.0)
MCH: 31.6 pg (ref 26.0–34.0)
MCHC: 33.9 g/dL (ref 30.0–36.0)
MCV: 93.3 fL (ref 80.0–100.0)
Monocytes Absolute: 0.9 10*3/uL (ref 0.1–1.0)
Monocytes Relative: 17 %
Neutro Abs: 2.7 10*3/uL (ref 1.7–7.7)
Neutrophils Relative %: 50 %
Platelets: 199 10*3/uL (ref 150–400)
RBC: 3.89 MIL/uL (ref 3.87–5.11)
RDW: 15.2 % (ref 11.5–15.5)
WBC: 5.4 10*3/uL (ref 4.0–10.5)
nRBC: 0 % (ref 0.0–0.2)

## 2019-10-23 MED ORDER — DIPHENHYDRAMINE HCL 25 MG PO CAPS
ORAL_CAPSULE | ORAL | Status: AC
  Filled 2019-10-23: qty 1

## 2019-10-23 MED ORDER — SODIUM CHLORIDE 0.9 % IV SOLN
Freq: Once | INTRAVENOUS | Status: DC
  Filled 2019-10-23: qty 250

## 2019-10-23 MED ORDER — DEXAMETHASONE 4 MG PO TABS
ORAL_TABLET | ORAL | Status: AC
  Filled 2019-10-23: qty 5

## 2019-10-23 MED ORDER — PROCHLORPERAZINE MALEATE 10 MG PO TABS
ORAL_TABLET | ORAL | Status: AC
  Filled 2019-10-23: qty 1

## 2019-10-23 MED ORDER — ACETAMINOPHEN 325 MG PO TABS
ORAL_TABLET | ORAL | Status: AC
  Filled 2019-10-23: qty 2

## 2019-10-23 MED ORDER — SODIUM CHLORIDE 0.9% FLUSH
10.0000 mL | Freq: Once | INTRAVENOUS | Status: AC
  Administered 2019-10-23: 10 mL
  Filled 2019-10-23: qty 10

## 2019-10-23 MED ORDER — LENALIDOMIDE 15 MG PO CAPS: ORAL_CAPSULE | ORAL | 0 refills | Status: DC

## 2019-10-23 MED ORDER — HEPARIN SOD (PORK) LOCK FLUSH 100 UNIT/ML IV SOLN
500.0000 [IU] | Freq: Once | INTRAVENOUS | Status: AC | PRN
  Administered 2019-10-23: 500 [IU]
  Filled 2019-10-23: qty 5

## 2019-10-23 MED ORDER — PROCHLORPERAZINE MALEATE 10 MG PO TABS
10.0000 mg | ORAL_TABLET | Freq: Once | ORAL | Status: AC
  Administered 2019-10-23: 10 mg via ORAL

## 2019-10-23 MED ORDER — ZOLEDRONIC ACID 4 MG/100ML IV SOLN
4.0000 mg | Freq: Once | INTRAVENOUS | Status: AC
  Administered 2019-10-23: 4 mg via INTRAVENOUS
  Filled 2019-10-23: qty 100

## 2019-10-23 MED ORDER — FAMOTIDINE 20 MG PO TABS
20.0000 mg | ORAL_TABLET | Freq: Once | ORAL | Status: AC
  Administered 2019-10-23: 20 mg via ORAL

## 2019-10-23 MED ORDER — DEXTROSE 5 % IV SOLN
55.0000 mg/m2 | Freq: Once | INTRAVENOUS | Status: AC
  Administered 2019-10-23: 100 mg via INTRAVENOUS
  Filled 2019-10-23: qty 30

## 2019-10-23 MED ORDER — DEXAMETHASONE 4 MG PO TABS
20.0000 mg | ORAL_TABLET | Freq: Once | ORAL | Status: AC
  Administered 2019-10-23: 20 mg via ORAL

## 2019-10-23 MED ORDER — FAMOTIDINE 20 MG PO TABS
ORAL_TABLET | ORAL | Status: AC
  Filled 2019-10-23: qty 1

## 2019-10-23 MED ORDER — ACETAMINOPHEN 325 MG PO TABS
650.0000 mg | ORAL_TABLET | Freq: Once | ORAL | Status: AC
  Administered 2019-10-23: 650 mg via ORAL

## 2019-10-23 MED ORDER — SODIUM CHLORIDE 0.9 % IV SOLN
Freq: Once | INTRAVENOUS | Status: AC
  Administered 2019-10-23: 13:00:00 via INTRAVENOUS
  Filled 2019-10-23: qty 250

## 2019-10-23 MED ORDER — DIPHENHYDRAMINE HCL 25 MG PO TABS
25.0000 mg | ORAL_TABLET | Freq: Once | ORAL | Status: AC
  Administered 2019-10-23: 25 mg via ORAL
  Filled 2019-10-23: qty 1

## 2019-10-23 MED ORDER — SODIUM CHLORIDE 0.9% FLUSH
10.0000 mL | INTRAVENOUS | Status: DC | PRN
  Administered 2019-10-23: 10 mL
  Filled 2019-10-23: qty 10

## 2019-10-23 NOTE — Patient Instructions (Signed)

## 2019-10-23 NOTE — Patient Instructions (Signed)
[facility] Discharge Instructions for Patients Receiving Chemotherapy  Today you received the following chemotherapy agents Carfilzomib (KYPROLIS).  To help prevent nausea and vomiting after your treatment, we encourage you to take your nausea medication as prescribed.  If you develop nausea and vomiting that is not controlled by your nausea medication, call the clinic.   BELOW ARE SYMPTOMS THAT SHOULD BE REPORTED IMMEDIATELY:  *FEVER GREATER THAN 100.5 F  *CHILLS WITH OR WITHOUT FEVER  NAUSEA AND VOMITING THAT IS NOT CONTROLLED WITH YOUR NAUSEA MEDICATION  *UNUSUAL SHORTNESS OF BREATH  *UNUSUAL BRUISING OR BLEEDING  TENDERNESS IN MOUTH AND THROAT WITH OR WITHOUT PRESENCE OF ULCERS  *URINARY PROBLEMS  *BOWEL PROBLEMS  UNUSUAL RASH Items with * indicate a potential emergency and should be followed up as soon as possible.  Feel free to call the clinic should you have any questions or concerns. The clinic phone number is [phone].  Please show the CHEMO ALERT CARD at check-in to the Emergency Department and triage nurse.  Zoledronic Acid injection (Hypercalcemia, Oncology) What is this medicine? ZOLEDRONIC ACID (ZOE le dron ik AS id) lowers the amount of calcium loss from bone. It is used to treat too much calcium in your blood from cancer. It is also used to prevent complications of cancer that has spread to the bone. This medicine may be used for other purposes; ask your health care provider or pharmacist if you have questions. COMMON BRAND NAME(S): Zometa What should I tell my health care provider before I take this medicine? They need to know if you have any of these conditions:  aspirin-sensitive asthma  cancer, especially if you are receiving medicines used to treat cancer  dental disease or wear dentures  infection  kidney disease  receiving corticosteroids like dexamethasone or prednisone  an unusual or allergic reaction to zoledronic  acid, other medicines, foods, dyes, or preservatives  pregnant or trying to get pregnant  breast-feeding How should I use this medicine? This medicine is for infusion into a vein. It is given by a health care professional in a hospital or clinic setting. Talk to your pediatrician regarding the use of this medicine in children. Special care may be needed. Overdosage: If you think you have taken too much of this medicine contact a poison control center or emergency room at once. NOTE: This medicine is only for you. Do not share this medicine with others. What if I miss a dose? It is important not to miss your dose. Call your doctor or health care professional if you are unable to keep an appointment. What may interact with this medicine?  certain antibiotics given by injection  NSAIDs, medicines for pain and inflammation, like ibuprofen or naproxen  some diuretics like bumetanide, furosemide  teriparatide  thalidomide This list may not describe all possible interactions. Give your health care provider a list of all the medicines, herbs, non-prescription drugs, or dietary supplements you use. Also tell them if you smoke, drink alcohol, or use illegal drugs. Some items may interact with your medicine. What should I watch for while using this medicine? Visit your doctor or health care professional for regular checkups. It may be some time before you see the benefit from this medicine. Do not stop taking your medicine unless your doctor tells you to. Your doctor may order blood tests or other tests to see how you are doing. Women should inform their doctor if they wish to become pregnant or think they might be pregnant.   There is a potential for serious side effects to an unborn child. Talk to your health care professional or pharmacist for more information. You should make sure that you get enough calcium and vitamin D while you are taking this medicine. Discuss the foods you eat and the  vitamins you take with your health care professional. Some people who take this medicine have severe bone, joint, and/or muscle pain. This medicine may also increase your risk for jaw problems or a broken thigh bone. Tell your doctor right away if you have severe pain in your jaw, bones, joints, or muscles. Tell your doctor if you have any pain that does not go away or that gets worse. Tell your dentist and dental surgeon that you are taking this medicine. You should not have major dental surgery while on this medicine. See your dentist to have a dental exam and fix any dental problems before starting this medicine. Take good care of your teeth while on this medicine. Make sure you see your dentist for regular follow-up appointments. What side effects may I notice from receiving this medicine? Side effects that you should report to your doctor or health care professional as soon as possible:  allergic reactions like skin rash, itching or hives, swelling of the face, lips, or tongue  anxiety, confusion, or depression  breathing problems  changes in vision  eye pain  feeling faint or lightheaded, falls  jaw pain, especially after dental work  mouth sores  muscle cramps, stiffness, or weakness  redness, blistering, peeling or loosening of the skin, including inside the mouth  trouble passing urine or change in the amount of urine Side effects that usually do not require medical attention (report to your doctor or health care professional if they continue or are bothersome):  bone, joint, or muscle pain  constipation  diarrhea  fever  hair loss  irritation at site where injected  loss of appetite  nausea, vomiting  stomach upset  trouble sleeping  trouble swallowing  weak or tired This list may not describe all possible side effects. Call your doctor for medical advice about side effects. You may report side effects to FDA at 1-800-FDA-1088. Where should I keep my  medicine? This drug is given in a hospital or clinic and will not be stored at home. NOTE: This sheet is a summary. It may not cover all possible information. If you have questions about this medicine, talk to your doctor, pharmacist, or health care provider.  2020 [employer] Standard (2014-04-28 14:19:39)   

## 2019-10-24 ENCOUNTER — Other Ambulatory Visit: Payer: Self-pay | Admitting: Hematology

## 2019-10-24 MED ORDER — OXYCODONE HCL 10 MG PO TABS: 10.0000 mg | ORAL_TABLET | Freq: Four times a day (QID) | ORAL | 0 refills | Status: DC | PRN

## 2019-10-25 ENCOUNTER — Ambulatory Visit
Admission: RE | Admit: 2019-10-25 | Discharge: 2019-10-25 | Disposition: A | Discharge status: Home / Self Care | Payer: Medicare Other | Source: Ambulatory Visit | Attending: Internal Medicine | Admitting: Internal Medicine

## 2019-10-25 ENCOUNTER — Other Ambulatory Visit: Payer: Self-pay

## 2019-10-25 DIAGNOSIS — Z1231 Encounter for screening mammogram for malignant neoplasm of breast: Secondary | ICD-10-CM

## 2019-10-27 ENCOUNTER — Other Ambulatory Visit: Payer: Self-pay | Admitting: Internal Medicine

## 2019-11-02 ENCOUNTER — Inpatient Hospital Stay: Payer: Medicare Other

## 2019-11-02 ENCOUNTER — Inpatient Hospital Stay (HOSPITAL_BASED_OUTPATIENT_CLINIC_OR_DEPARTMENT_OTHER): Payer: Medicare Other | Admitting: Hematology

## 2019-11-02 ENCOUNTER — Other Ambulatory Visit: Payer: Self-pay

## 2019-11-02 ENCOUNTER — Encounter: Payer: Self-pay | Admitting: Hematology

## 2019-11-02 ENCOUNTER — Other Ambulatory Visit: Payer: Self-pay | Admitting: *Deleted

## 2019-11-02 VITALS — BP 119/66 | HR 85 | Temp 98.2°F | Resp 18 | Ht 59.0 in | Wt 172.7 lb

## 2019-11-02 DIAGNOSIS — C9000 Multiple myeloma not having achieved remission: Secondary | ICD-10-CM

## 2019-11-02 DIAGNOSIS — Z5112 Encounter for antineoplastic immunotherapy: Secondary | ICD-10-CM | POA: Diagnosis not present

## 2019-11-02 DIAGNOSIS — Z7189 Other specified counseling: Secondary | ICD-10-CM

## 2019-11-02 DIAGNOSIS — Z95828 Presence of other vascular implants and grafts: Secondary | ICD-10-CM

## 2019-11-02 LAB — CBC WITH DIFFERENTIAL/PLATELET
Abs Immature Granulocytes: 0.01 10*3/uL (ref 0.00–0.07)
Basophils Absolute: 0.1 10*3/uL (ref 0.0–0.1)
Basophils Relative: 1 %
Eosinophils Absolute: 0.1 10*3/uL (ref 0.0–0.5)
Eosinophils Relative: 1 %
HCT: 36.1 % (ref 36.0–46.0)
Hemoglobin: 11.9 g/dL — ABNORMAL LOW (ref 12.0–15.0)
Immature Granulocytes: 0 %
Lymphocytes Relative: 23 %
Lymphs Abs: 1.2 10*3/uL (ref 0.7–4.0)
MCH: 32.2 pg (ref 26.0–34.0)
MCHC: 33 g/dL (ref 30.0–36.0)
MCV: 97.6 fL (ref 80.0–100.0)
Monocytes Absolute: 0.8 10*3/uL (ref 0.1–1.0)
Monocytes Relative: 16 %
Neutro Abs: 3 10*3/uL (ref 1.7–7.7)
Neutrophils Relative %: 59 %
Platelets: 218 10*3/uL (ref 150–400)
RBC: 3.7 MIL/uL — ABNORMAL LOW (ref 3.87–5.11)
RDW: 15.1 % (ref 11.5–15.5)
WBC: 5.2 10*3/uL (ref 4.0–10.5)
nRBC: 0 % (ref 0.0–0.2)

## 2019-11-02 LAB — CMP (CANCER CENTER ONLY)
ALT: 21 U/L (ref 0–44)
AST: 19 U/L (ref 15–41)
Albumin: 3.4 g/dL — ABNORMAL LOW (ref 3.5–5.0)
Alkaline Phosphatase: 81 U/L (ref 38–126)
Anion gap: 12 (ref 5–15)
BUN: 11 mg/dL (ref 8–23)
CO2: 22 mmol/L (ref 22–32)
Calcium: 9 mg/dL (ref 8.9–10.3)
Chloride: 106 mmol/L (ref 98–111)
Creatinine: 0.83 mg/dL (ref 0.44–1.00)
GFR, Est AFR Am: >60 mL/min (ref >60)
GFR, Estimated: >60 mL/min (ref >60)
Glucose, Bld: 210 mg/dL — ABNORMAL HIGH (ref 70–99)
Potassium: 3.9 mmol/L (ref 3.5–5.1)
Sodium: 140 mmol/L (ref 135–145)
Total Bilirubin: 0.8 mg/dL (ref 0.3–1.2)
Total Protein: 6.2 g/dL — ABNORMAL LOW (ref 6.5–8.1)

## 2019-11-02 MED ORDER — HEPARIN SOD (PORK) LOCK FLUSH 100 UNIT/ML IV SOLN
500.0000 [IU] | Freq: Once | INTRAVENOUS | Status: AC
  Administered 2019-11-02: 14:00:00 500 [IU]
  Filled 2019-11-02: qty 5

## 2019-11-02 MED ORDER — FENTANYL 12 MCG/HR TD PT72: 1.0000 | MEDICATED_PATCH | TRANSDERMAL | 0 refills | Status: DC

## 2019-11-02 MED ORDER — SODIUM CHLORIDE 0.9% FLUSH
10.0000 mL | Freq: Once | INTRAVENOUS | Status: AC
  Administered 2019-11-02: 10 mL
  Filled 2019-11-02: qty 10

## 2019-11-02 MED ORDER — VITAMIN D (ERGOCALCIFEROL) 1.25 MG (50000 UNIT) PO CAPS: ORAL_CAPSULE | ORAL | 0 refills | Status: DC

## 2019-11-02 NOTE — Progress Notes (Signed)
HEMATOLOGY/ONCOLOGY CLINIC NOTE  Date of Service: 11/02/2019  Patient Care Team: Hoyt Koch, MD as PCP - General (Internal Medicine) Lafayette Dragon, MD (Inactive) as Consulting Physician (Gastroenterology) Megan Salon, MD as Consulting Physician (Gynecology) Melrose Nakayama, MD as Consulting Physician (Orthopedic Surgery) Deneise Lever, MD as Consulting Physician (Pulmonary Disease) [name], MD (Ophthalmology)  CHIEF COMPLAINTS/PURPOSE OF CONSULTATION:  Continue mx of myeloma  HISTORY OF PRESENTING ILLNESS:   Faith Orr is a wonderful 78 y.o. female who has been referred to [name] by Dr. Pricilla Holm for evaluation and management of Lytic bone lesions. She is accompanied today by her son in law, and her partner is present via phone. The pt reports that she is doing well overall.   The pt notes that 2-3 months ago while standing at the stove, and otherwise feeling normally, she felt "something snap that took her breath away" in her mid back as she stretched to get something. The pt notes that she saw a chiropractor twice due to her back stiffness, which did not help. The pt then described her new bone pains to her PCP on 12/05/18, and subsequent imaging, as noted below, revealed concerns for numerous bone lesions. She has begun 58m Fosamax. The pt notes that she had hip pain in 2018, and that an XR at that time did not reveal any lesions.  The pt notes that most of her pain is concentrated to her left shoulder presently, and with minimal movement of the arm. She endorses pain radiating into her left arm and notes that her hand is swollen in the mornings when she wakes up. She also endorses present back pain and right hip pain, worse when she walks. She has not yet seen orthopedics. The pt reports that she is needing to take 6079mAdvil every 4-6 hours as Tramadol alone has not been able to alleviate her pain. The pt denies any unexpected weight loss,  fevers, chills, or night sweats. The pt notes that her urine is a very dark color presently, but denies overt blood in the urine, underpants, nor tissue paper. The pt notes that in the last two weeks she has had some soreness in her head, but denies new headaches or changes in vision. The pt notes that she has been urinating more frequently overall, but has been trying to stay better hydrated as well.  The pt notes that she has been compliant with annual mammograms.   The pt notes that she had a cyst in her right breast which was removed in the past. She fractured her left wrist in 2008 after falling down stairs. The pt endorses history of fatty liver.   The pt denies ever smoking cigarettes and endorses significant second hand smoke exposure with a previous marriage.   Of note prior to the patient's visit today, pt has had a Bone Scan completed on 12/13/18 with results revealing Multifocal uptake throughout the skeleton, consistent with diffuse metastatic disease. Primary tumor is not specified. 2. Uptake in the proximal right femur, consistent with lytic lesions. 3. Uptake in the ribs bilaterally as described. 4. Lesions in the proximal left humerus. 5. Diffuse uptake throughout the skull consistent with metastatic disease. 6. Right paramedian uptake at the manubrium.  Most recent lab results (12/08/18) of CBC w/diff and CMP is as follows: all values are WNL except for Glucose at 279, BUN at 24, AST at 41, ALT at 46. 12/08/18 SPEP revealed all values WNL except for Total  Protein at 6.0, Albumin at 3.6, Gamma globulin at 0.7, and M spike at 0.5g  On review of systems, pt reports significant left shoulder pain, back pain, right hip pain, dark urine, and denies fevers, chills, night sweats, unexpected weight loss, changes in bowel habits, changes in breathing, cough, new respiratory symptoms, changes in vision, abdominal pains, leg swelling, and any other symptoms.   On PMHx the pt reports fatty liver,  and denies blood clots.  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 Denies ever smoking.  On Family Hx the pt reports maternal grandmother with colon cancer. Father with bladder cancer and amyloidosis (pt notes that this could have been misdiagnosis). Mother with Protein S deficiency and polymyalgia rheumatica.  Current Treatment: cycle 10 day 15   Interval History:   Faith Orr returns today for management and evaluation of her Multiple Myeloma and C9D15 of maintenance Carfilzomib, Revlimid, and Dexamethasone. The patient's last visit with [name] was on 10/09/2019. The pt reports that she is doing well overall.  The pt reports she fell and was admitted to the ED on 10/10/2019.  She is tolerating treatment well  Lab results today (11/02/19) of CBC w/diff and CMP is as follows: all values are WNL except for RBC at 3.70, Hemoglobin at 11.9, Glucose Bld at 210, Total Protein at 6.2, Albumin at 3.4,.  On review of systems, pt denies rashes, leg swelling, abdominal pain and any other symptoms.    MEDICAL HISTORY:  Past Medical History:  Diagnosis Date   Allergy    seasonal   Asthma    DEPRESSION    DIABETES MELLITUS, TYPE II    Diverticulosis    HYPERLIPIDEMIA    Macular degeneration of left eye    mild, Dr.Hecker   Obesity, unspecified    Osteoarthritis of both knees    OSTEOPENIA    Osteopenia    URINARY INCONTINENCE     SURGICAL HISTORY: Past Surgical History:  Procedure Laterality Date   CATARACT EXTRACTION Left 05/24/2018   CESAREAN SECTION  01/1973   CYSTOSCOPY/URETEROSCOPY/HOLMIUM LASER/STENT PLACEMENT Right 09/20/2019   Procedure: CYSTOSCOPY/URETEROSCOPY/HOLMIUM LASER/STENT PLACEMENT;  Surgeon: Lucas Mallow, MD;  Location: [facility];  Service: Urology;  Laterality: Right;   FRACTURE SURGERY     IR IMAGING GUIDED PORT INSERTION  02/20/2019   left wrist surgery  2008   By Dr.  Latanya Maudlin   right ankle  1994    SOCIAL HISTORY: Social History   Socioeconomic History   Marital status: Married    Spouse name: Not on file   Number of children: 1   Years of education: Not on file   Highest education level: Not on file  Occupational History    Employer: [employer] resource strain: Patient refused   Food insecurity    Worry: Patient refused    Inability: Patient refused   Transportation needs    Medical: Patient refused    Non-medical: Patient refused  Tobacco Use   Smoking status: Never Smoker   Smokeless tobacco: Never Used   Tobacco comment: Lives with partner [name]) and son  Substance and Sexual Activity   Alcohol use: No    Alcohol/week: 0.0 standard drinks   Drug use: No   Sexual activity: Never    Partners: Female    Birth control/protection: Post-menopausal    Comment: Lives with female partner (annette hicks) and 81 yo  son  Lifestyle   Physical activity    Days per week: Patient refused    Minutes per session: Patient refused   Stress: Patient refused  Relationships   Social connections    Talks on phone: Patient refused    Gets together: Patient refused    Attends religious service: Patient refused    Active member of club or organization: Patient refused    Attends meetings of clubs or organizations: Patient refused    Relationship status: Patient refused   Intimate partner violence    Fear of current or ex partner: Patient refused    Emotionally abused: Patient refused    Physically abused: Patient refused    Forced sexual activity: Patient refused  Other Topics Concern   Not on file  Social History Narrative   Not on file    FAMILY HISTORY: Family History  Problem Relation Age of Onset   Diabetes Father    Hyperlipidemia Father    Heart disease Father    Cancer Father    Hypertension Father    Colon cancer Paternal Grandmother 68   Osteoporosis  Mother    Protein S deficiency Mother    Hyperlipidemia Mother    Multiple sclerosis Daughter    Cancer Other        bladder   Breast cancer Neg Hx     ALLERGIES:  is allergic to penicillins; aleve [naproxen sodium]; and sulfonamide derivatives.  MEDICATIONS:  Current Outpatient Medications  Medication Sig Dispense Refill   acyclovir (ZOVIRAX) 400 MG tablet TAKE 1 TABLET(400 MG) BY MOUTH TWICE DAILY 60 tablet 0   aspirin EC 81 MG tablet Take 81 mg by mouth daily after breakfast.      Blood Glucose Monitoring Suppl (FREESTYLE FREEDOM LITE) W/DEVICE KIT Use to check blood sugars twice a day Dx 250.00 1 each 0   Calcium Carbonate-Vitamin D (CALCIUM 600+D HIGH POTENCY) 600-400 MG-UNIT per tablet Take 1 tablet by mouth 2 (two) times daily.      Cetirizine HCl 10 MG CAPS Take 1 capsule (10 mg total) by mouth daily. 30 capsule 1   dexamethasone (DECADRON) 4 MG tablet Take 5 tablets (20 mg total) by mouth once a week. On D22 of each cycle of treatment 20 tablet 5   docusate sodium (DOK) 100 MG capsule Take 2 capsules (200 mg total) by mouth at bedtime. 60 capsule 1   fentaNYL (DURAGESIC) 12 MCG/HR Place 1 patch onto the skin every 3 (three) days. 10 patch 0   fluticasone (FLONASE) 50 MCG/ACT nasal spray Place 1 spray into both nostrils daily. (Patient taking differently: Place 1 spray into both nostrils daily as needed for allergies or rhinitis. ) 16 g 2   glipiZIDE (GLUCOTROL XL) 5 MG 24 hr tablet TAKE 1 TABLET(5 MG) BY MOUTH DAILY WITH BREAKFAST 90 tablet 1   glucose blood (FREESTYLE LITE) test strip CHECK BLOOD SUGAR TWICE DAILY AS DIRECTED Dx 250.00 180 each 3   Lancets (FREESTYLE) lancets Use twice daily to check sugars. 100 each 11   lenalidomide (REVLIMID) 15 MG capsule TAKE 1 CAPSULE BY MOUTH  DAILY TAKE FOR 21 DAYS ON,  7 DAYS OFF, REPEAT EVERY 28 DAYS 21 capsule 0   lidocaine-prilocaine (EMLA) cream APPLY 1 APPLICATION TO THE AFFECTED AREA AS NEEDED. USE PRIOR TO  PORT ACCESS 30 g 0   LORazepam (ATIVAN) 0.5 MG tablet Take 1 tablet (0.5 mg total) by mouth every 8 (eight) hours as needed for anxiety (significant essential tremors). (Patient  not taking: Reported on 09/25/2019) 60 tablet 0   metFORMIN (GLUCOPHAGE-XR) 500 MG 24 hr tablet TAKE 3 TABLETS(1500 MG) BY MOUTH DAILY WITH BREAKFAST (Patient taking differently: Take 1,000 mg by mouth daily. ) 270 tablet 1   Multiple Vitamins-Minerals (ICAPS) CAPS Take 1 capsule by mouth daily after breakfast.      ondansetron (ZOFRAN) 8 MG tablet Take 1 tablet (8 mg total) by mouth 2 (two) times daily as needed (Nausea or vomiting). 30 tablet 1   Oxycodone HCl 10 MG TABS Take 1 tablet (10 mg total) by mouth every 6 (six) hours as needed. 90 tablet 0   pantoprazole (PROTONIX) 20 MG tablet TAKE 1 TABLET(20 MG) BY MOUTH DAILY 30 tablet 5   polyethylene glycol (MIRALAX / GLYCOLAX) packet Take 17 g by mouth daily after breakfast.      prochlorperazine (COMPAZINE) 10 MG tablet Take 1 tablet (10 mg total) by mouth every 6 (six) hours as needed (Nausea or vomiting). 30 tablet 1   sertraline (ZOLOFT) 50 MG tablet TAKE 1 TABLET BY MOUTH DAILY 90 tablet 0   simvastatin (ZOCOR) 20 MG tablet TAKE 1 TABLET(20 MG) BY MOUTH DAILY 90 tablet 0   Triamcinolone Acetonide 0.025 % LOTN Apply 1 application topically 3 (three) times daily as needed (rash/itching). 60 mL 2   Vitamin D, Ergocalciferol, (DRISDOL) 1.25 MG (50000 UT) CAPS capsule TAKE 1 CAPSULE BY MOUTH EVERY 7 DAYS 12 capsule 0   No current facility-administered medications for this visit.    Facility-Administered Medications Ordered in Other Visits  Medication Dose Route Frequency Provider Last Rate Last Dose   heparin lock flush 100 unit/mL  500 Units Intracatheter Once PRN Brunetta Genera, MD       sodium chloride flush (NS) 0.9 % injection 10 mL  10 mL Intracatheter PRN Brunetta Genera, MD   10 mL at 10/02/19 1524    REVIEW OF SYSTEMS:   A 10+ POINT  REVIEW OF SYSTEMS WAS OBTAINED including neurology, dermatology, psychiatry, cardiac, respiratory, lymph, extremities, GI, GU, Musculoskeletal, constitutional, breasts, reproductive, HEENT.  All pertinent positives are noted in the HPI.  All others are negative.    PHYSICAL EXAMINATION: ECOG FS:2 - Symptomatic, <50% confined to bed  Vitals:   11/02/19 1436  BP: 119/66  Pulse: 85  Resp: 18  Temp: 98.2 F (36.8 C)  SpO2: 97%   Wt Readings from Last 3 Encounters:  11/02/19 172 lb 11.2 oz (78.3 kg)  10/09/19 170 lb 6.4 oz (77.3 kg)  10/02/19 168 lb 8 oz (76.4 kg)   Body mass index is 34.88 kg/m.    GENERAL:alert, in no acute distress and comfortable SKIN: no acute rashes, no significant lesions EYES: conjunctiva are pink and non-injected, sclera anicteric OROPHARYNX: MMM, no exudates, no oropharyngeal erythema or ulceration NECK: supple, no JVD LYMPH:  no palpable lymphadenopathy in the cervical, axillary or inguinal regions LUNGS: clear to auscultation b/l with normal respiratory effort HEART: regular rate & rhythm ABDOMEN:  normoactive bowel sounds , non tender, not distended. Extremity: no pedal edema PSYCH: alert & oriented x 3 with fluent speech NEURO: no focal motor/sensory deficits   LABORATORY DATA:  I have reviewed the data as listed  . CBC Latest Ref Rng & Units 10/23/2019 10/09/2019 10/02/2019  WBC 4.0 - 10.5 K/uL 5.4 6.5 6.1  Hemoglobin 12.0 - 15.0 g/dL 12.3 12.5 12.1  Hematocrit 36.0 - 46.0 % 36.3 37.6 36.1  Platelets 150 - 400 K/uL 199 172 161   .  CBC    Component Value Date/Time   WBC 5.4 10/23/2019 1141   RBC 3.89 10/23/2019 1141   HGB 12.3 10/23/2019 1141   HGB 12.8 02/16/2019 1138   HCT 36.3 10/23/2019 1141   PLT 199 10/23/2019 1141   PLT 170 02/16/2019 1138   MCV 93.3 10/23/2019 1141   MCH 31.6 10/23/2019 1141   MCHC 33.9 10/23/2019 1141   RDW 15.2 10/23/2019 1141   LYMPHSABS 1.3 10/23/2019 1141   MONOABS 0.9 10/23/2019 1141   EOSABS 0.3  10/23/2019 1141   BASOSABS 0.1 10/23/2019 1141    . CMP Latest Ref Rng & Units 10/23/2019 10/09/2019 10/02/2019  Glucose 70 - 99 mg/dL 222(H) 219(H) 191(H)  BUN 8 - 23 mg/dL '11 9 12  ' Creatinine 0.44 - 1.00 mg/dL 0.76 0.81 0.75  Sodium 135 - 145 mmol/L 141 138 137  Potassium 3.5 - 5.1 mmol/L 3.5 3.6 3.3(L)  Chloride 98 - 111 mmol/L 105 104 104  CO2 22 - 32 mmol/L 22 20(L) 19(L)  Calcium 8.9 - 10.3 mg/dL 9.3 9.4 8.7(L)  Total Protein 6.5 - 8.1 g/dL 6.3(L) 6.3(L) 5.9(L)  Total Bilirubin 0.3 - 1.2 mg/dL 0.7 0.6 0.6  Alkaline Phos 38 - 126 U/L 87 73 74  AST 15 - 41 U/L 14(L) 12(L) 11(L)  ALT 0 - 44 U/L '19 16 16   ' 09/18/2019 BM Bx Report ([id number])   09/18/2019 FISH Panel    05/30/2019 BM Bx   01/06/2019 BM Bx:     01/06/19 Cytogenetics:      05/30/19 BM Biopsy:   09/18/2019 FISH Panel    09/18/2019 BM Surgical Pathology ([id number])     RADIOGRAPHIC STUDIES: I have personally reviewed the radiological images as listed and agreed with the findings in the report. Dg Ribs Unilateral W/chest Right  Result Date: 10/10/2019 CLINICAL DATA:  Pain following recent fall. History of multiple myeloma EXAM: RIGHT RIBS AND CHEST - 3+ VIEW COMPARISON:  Chest radiograph February 04, 2019 FINDINGS: Frontal chest as well as oblique and cone-down rib images obtained. Port-A-Cath tip is in the superior vena cava. No pneumothorax. There is scarring in the left lower lobe there is no edema or consolidation. Heart size and pulmonary vascularity are normal. No adenopathy. There is no evident pneumothorax or pleural effusion. There are old healed fractures of the right posterior fifth, sixth, and seventh ribs. There is no appreciable acute fracture. There is sclerosis in the proximal left humerus, incompletely visualized. IMPRESSION: Lytic lesions in the right scapula right humerus consistent with multiple [place]. Sclerosis in the proximal left humerus, incompletely visualized. Several  prior rib fractures on the right with healing. No acute fracture evident. Stable scarring left lower lobe. No edema or consolidation. Stable cardiac silhouette. No pneumothorax. Port-A-Cath tip in superior vena cava. No pneumothorax. Electronically Signed   By: Lowella Grip III M.D.   On: 10/10/2019 14:27   Ct Head Wo Contrast  Result Date: 10/10/2019 CLINICAL DATA:  Fall yesterday. EXAM: CT HEAD WITHOUT CONTRAST TECHNIQUE: Contiguous axial images were obtained from the base of the skull through the vertex without intravenous contrast. COMPARISON:  None. FINDINGS: Brain: No evidence of acute infarction, hemorrhage, hydrocephalus, extra-axial collection or mass lesion/mass effect. Vascular: No hyperdense vessel or unexpected calcification. Skull: Negative for fracture or focal lesion. Sinuses/Orbits: No acute finding. Other: None. IMPRESSION: 1. No acute intracranial abnormality. Electronically Signed   By: Titus Dubin M.D.   On: 10/10/2019 14:30   Mm 3d Screen Breast Bilateral  Result Date:  10/26/2019 CLINICAL DATA:  Screening. EXAM: DIGITAL SCREENING BILATERAL MAMMOGRAM WITH TOMO AND CAD COMPARISON:  Previous exam(s). ACR Breast Density Category b: There are scattered areas of fibroglandular density. FINDINGS: There are no findings suspicious for malignancy. Images were processed with CAD. IMPRESSION: No mammographic evidence of malignancy. A result letter of this screening mammogram will be mailed directly to the patient. RECOMMENDATION: Screening mammogram in one year. (Code:SM-B-01Y) BI-RADS CATEGORY  1: Negative. Electronically Signed   By: Audie Pinto M.D.   On: 10/26/2019 10:09    ASSESSMENT & PLAN:   78 y.o. female with  1. Recently diagnosed Multiple Myeloma, RISS Stage III  Labs upon initial presentation from 12/08/18, blood counts are normal including WBC at 7.1k, HGB at 13.1, and PLT at 245k. Calcium normal at 10.3. Creatinine normal at 0.63. M spike at 0.5g. 12/13/18  Bone Scan revealed Multifocal uptake throughout the skeleton, consistent with diffuse metastatic disease. Primary tumor is not specified. 2. Uptake in the proximal right femur, consistent with lytic lesions. 3. Uptake in the ribs bilaterally as described. 4. Lesions in the proximal left humerus. 5. Diffuse uptake throughout the skull consistent with metastatic disease. 6. Right paramedian uptake at the manubrium.  12/13/18 CT Right Femur revealed Numerous lytic lesions involving the right femur and a lytic lesion in the left inferior pubic ramus. Overall appearance is most concerning for multiple myeloma  12/27/18 Pretreatment 24hour UPEP observed an M spike at 58m, and showed 1940mtotal protein/day.  12/27/18 Pretreatment MMP revealed M Protein at 0.5g with IgG Lambda specificity. Kappa:Lambda light chain ratio at 0.13, with Lambda at 40.3. There is less abnormal protein and light chains than I would expect from 30% plasma cells, which suggests hypo-secretory or non-secretory neoplastic plasma cells. Will have an impact in assessing response. 01/05/19 PET/CT revealed Innumerable lytic lesions in the skeleton compatible with myeloma. Most of the larger lesions are hypermetabolic, for example including a left proximal humeral shaft lesion with maximum SUV of 8.1 and a 2.8 cm lesion in the left T9 vertebral body with maximum SUV 5.1. Most of the smaller lytic lesions, and some of the larger lesions, do not demonstrate accentuated metabolic activity. 2. 1.2 cm in short axis lymph node in the left parapharyngeal space is hypermetabolic with maximum SUV 11.8. I do not see a separate mass in the head and neck to give rise to this hypermetabolic lymph node. 3. Mosaic attenuation in the lower lobes, nonspecific possibly from air trapping. 4.  Aortic Atherosclerosis 5. Heterogeneous activity in the liver, making it hard to exclude small liver lesions. Consider hepatic protocol MRI with and without contrast for  definitive assessment. Nonobstructive right nephrolithiasis. Old granulomatous disease  01/06/19 Bone Marrow biopsy revealed interstitial increase in plasma cells (28% aspirate, 40% CD138 immunohistochemistry). Plasma cells negative for light chains consistent with a non or weakly secretory myeloma   01/06/19 Cytogenetics revealed 37% of cells with trisomy 11 or 11q deletion, and 40.5% of cells with 17p mutation  S/p 5 cycles of KRD treatment  05/31/19 BM Biopsy revealed mild atypical plasmacytosis at 5% with polytypic variation.   06/01/19 PET/CT revealed "Dominant lesion in the LEFT humerus is decreased significantly in metabolic activity. Additional hypermetabolic skeletal lytic lesions have decreased in metabolic activity or similar to comparison exam (01/05/2019). No evidence of disease progression. 2. Multiple additional lytic lesions do not have metabolic activity and unchanged. 3. No new skeletal lesions are identified. No soft tissue plasmacytoma identified. 4. Nodule / node in the  LEFT parapharyngeal space which is intensely hypermetabolic not changed from prior. 5. New hypermetabolic LEFT lower lobe pulmonary nodule is indeterminate. Recommend close attention on follow-up 6. New obstructive hydronephrosis of the RIGHT kidney related to RIGHT UPJ stone."  09/18/2019 BM Bx Report which revealed "Slightly hypercellular bone marrow for age with trilineage hematopoiesis and 1% plasma cells."  09/14/2019 PET/CT Whole Body Scan ([id number]) which revealed "1. There widespread tiny lytic lesions compatible with multiple myeloma. Index larger lesions are generally similar to the prior exam, with low-grade activity such as the left T9 vertebral body lesion with maximum SUV 4.5. Is mild increase in the activity associated with a mildly sclerotic left proximal humeral lesion, maximum SUV 4.8 (previously 3.5). 2. At the site of the prior left lower lobe nodule is currently more bandlike thickening, with  maximum SUV only 1.9, probably benign, continued surveillance of this region suggested. 3. There several small but hypermetabolic lymph nodes. This includes a left parapharyngeal space node measuring 1.0 cm with maximum SUV 12.3 (stable); a left level IB lymph node measuring 0.5 cm with maximum SUV 4.8 (slightly larger than prior); and a left inguinal lymph node measuring 0.7 cm in short axis with maximum SUV 6.4 (previously 0.5 cm with maximum SUV 0.6). Significance of these lymph nodes uncertain, surveillance is recommended. 4. New 5 mm left lower lobe subpleural nodule on image 32/8, not appreciably hypermetabolic, surveillance suggested. 5. Focal subcutaneous stranding along the left perineum measuring about 2.6 by 1.1 cm on image 221/4, maximum SUV 12.5. This was not present previously and is most likely inflammatory, although given the notable SUV, surveillance of this region is suggested. 6. Other imaging findings of potential clinical significance: Aortic Atherosclerosis (ICD10-I70.0). Coronary atherosclerosis. Old granulomatous disease. Mild right hydronephrosis due to a 7 mm right UPJ calculus. 2 mm right kidney upper pole nonobstructive renal calculus. Prominent stool throughout the colon favors constipation."  2. 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  01/17/19 MRI Liver revealed Several appreciable liver lesions all have benign imaging characteristics. No MRI findings of metastatic involvement of the liver. 2. Scattered bony lesions corresponding to the lytic lesions seen at PET-CT, compatible with active myeloma. 3. Aortic Atherosclerosis.  Mild cardiomegaly. 4. Diffuse hepatic steatosis.   3. Left lower lobe pulmonary nodule First seen on 06/01/19 PET/CT  PLAN: -Discussed pt labwork today, 11/02/19; CBC w/diff and CMP is as follows: all values are WNL except for RBC at 3.70, Hemoglobin at 11.9,  Glucose Bld at 210, Total Protein at 6.2, Albumin at 3.4. -Discussed current pain management medications. -  Discussed that she is currently at carfilzomib 56 mg/m2 dose q2weeks. Discussed that we will increase infusions to 70 mg/m2 with about next cycle.  -continue current dose of Revlimd  FOLLOW UP: F/u as per scheduled appointments for the next 2 treatments  The total time spent in the appt was 25 minutes and more than 50% was on counseling and direct patient cares.  All of the patient's questions were answered with apparent satisfaction. The patient knows to call the clinic with any problems, questions or concerns.    Sullivan Lone MD MS AAHIVMS [facility] Hematology/Oncology Physician [facility]  (Office):       [phone] (Work cell):  [phone] (Fax):           [phone]  11/02/2019 5:19 AM  I, Scot Dock, am acting as a scribe for Dr. Sullivan Lone.   Marland Kitchen  I have reviewed the above documentation for accuracy and completeness, and I agree with the above. Brunetta Genera MD

## 2019-11-02 NOTE — Telephone Encounter (Signed)
Patient sent Mychart message - requested refill for Vit D (once a week) and Fentanyl patches.

## 2019-11-03 ENCOUNTER — Telehealth: Payer: Self-pay | Admitting: Hematology

## 2019-11-03 NOTE — Telephone Encounter (Signed)
Per 11/19 los F/u as per scheduled appointments for the next 2 treatments

## 2019-11-06 ENCOUNTER — Inpatient Hospital Stay: Payer: Medicare Other

## 2019-11-06 ENCOUNTER — Other Ambulatory Visit: Payer: Self-pay

## 2019-11-06 VITALS — BP 141/63 | HR 93 | Temp 98.2°F | Resp 18

## 2019-11-06 DIAGNOSIS — Z7189 Other specified counseling: Secondary | ICD-10-CM

## 2019-11-06 DIAGNOSIS — Z5112 Encounter for antineoplastic immunotherapy: Secondary | ICD-10-CM | POA: Diagnosis not present

## 2019-11-06 DIAGNOSIS — C9000 Multiple myeloma not having achieved remission: Secondary | ICD-10-CM

## 2019-11-06 MED ORDER — ACETAMINOPHEN 325 MG PO TABS
ORAL_TABLET | ORAL | Status: AC
  Filled 2019-11-06: qty 2

## 2019-11-06 MED ORDER — HEPARIN SOD (PORK) LOCK FLUSH 100 UNIT/ML IV SOLN
500.0000 [IU] | Freq: Once | INTRAVENOUS | Status: AC | PRN
  Administered 2019-11-06: 14:00:00 500 [IU]
  Filled 2019-11-06: qty 5

## 2019-11-06 MED ORDER — SODIUM CHLORIDE 0.9 % IV SOLN
Freq: Once | INTRAVENOUS | Status: AC
  Administered 2019-11-06: 13:00:00 via INTRAVENOUS
  Filled 2019-11-06: qty 250

## 2019-11-06 MED ORDER — PROCHLORPERAZINE MALEATE 10 MG PO TABS
10.0000 mg | ORAL_TABLET | Freq: Once | ORAL | Status: AC
  Administered 2019-11-06: 13:00:00 10 mg via ORAL

## 2019-11-06 MED ORDER — ACETAMINOPHEN 325 MG PO TABS
650.0000 mg | ORAL_TABLET | Freq: Once | ORAL | Status: AC
  Administered 2019-11-06: 13:00:00 650 mg via ORAL

## 2019-11-06 MED ORDER — DIPHENHYDRAMINE HCL 25 MG PO CAPS
25.0000 mg | ORAL_CAPSULE | Freq: Once | ORAL | Status: AC
  Administered 2019-11-06: 13:00:00 25 mg via ORAL

## 2019-11-06 MED ORDER — FAMOTIDINE 20 MG PO TABS
20.0000 mg | ORAL_TABLET | Freq: Once | ORAL | Status: AC
  Administered 2019-11-06: 20 mg via ORAL

## 2019-11-06 MED ORDER — DEXAMETHASONE 4 MG PO TABS
ORAL_TABLET | ORAL | Status: AC
  Filled 2019-11-06: qty 5

## 2019-11-06 MED ORDER — DEXAMETHASONE 4 MG PO TABS
20.0000 mg | ORAL_TABLET | Freq: Once | ORAL | Status: AC
  Administered 2019-11-06: 13:00:00 20 mg via ORAL

## 2019-11-06 MED ORDER — DIPHENHYDRAMINE HCL 25 MG PO CAPS
ORAL_CAPSULE | ORAL | Status: AC
  Filled 2019-11-06: qty 1

## 2019-11-06 MED ORDER — DEXTROSE 5 % IV SOLN
55.0000 mg/m2 | Freq: Once | INTRAVENOUS | Status: AC
  Administered 2019-11-06: 14:00:00 100 mg via INTRAVENOUS
  Filled 2019-11-06: qty 30

## 2019-11-06 MED ORDER — FAMOTIDINE 20 MG PO TABS
ORAL_TABLET | ORAL | Status: AC
  Filled 2019-11-06: qty 1

## 2019-11-06 MED ORDER — SODIUM CHLORIDE 0.9% FLUSH
10.0000 mL | INTRAVENOUS | Status: DC | PRN
  Administered 2019-11-06: 10 mL
  Filled 2019-11-06: qty 10

## 2019-11-06 MED ORDER — PROCHLORPERAZINE MALEATE 10 MG PO TABS
ORAL_TABLET | ORAL | Status: AC
  Filled 2019-11-06: qty 1

## 2019-11-06 NOTE — Patient Instructions (Signed)
Colon Discharge Instructions for Patients Receiving Chemotherapy  Today you received the following chemotherapy agents Carfilzomib (KYPROLIS).  To help prevent nausea and vomiting after your treatment, we encourage you to take your nausea medication as prescribed.   If you develop nausea and vomiting that is not controlled by your nausea medication, call the clinic.   BELOW ARE SYMPTOMS THAT SHOULD BE REPORTED IMMEDIATELY:  *FEVER GREATER THAN 100.5 F  *CHILLS WITH OR WITHOUT FEVER  NAUSEA AND VOMITING THAT IS NOT CONTROLLED WITH YOUR NAUSEA MEDICATION  *UNUSUAL SHORTNESS OF BREATH  *UNUSUAL BRUISING OR BLEEDING  TENDERNESS IN MOUTH AND THROAT WITH OR WITHOUT PRESENCE OF ULCERS  *URINARY PROBLEMS  *BOWEL PROBLEMS  UNUSUAL RASH Items with * indicate a potential emergency and should be followed up as soon as possible.  Feel free to call the clinic should you have any questions or concerns. The clinic phone number is [phone].  Please show the [place] at check-in to the Emergency Department and triage nurse.  Coronavirus (COVID-19) Are you at risk?  Are you at risk for the Coronavirus (COVID-19)?  To be considered HIGH RISK for Coronavirus (COVID-19), you have to meet the following criteria:  . Traveled to [place], [place], [place], [place] or [place]; or in the [place] to [place], [place], [place], or [place]; and have fever, cough, and shortness of breath within the last 2 weeks of travel OR . Been in close contact with a person diagnosed with COVID-19 within the last 2 weeks and have fever, cough, and shortness of breath . IF YOU DO NOT MEET THESE CRITERIA, YOU ARE CONSIDERED LOW RISK FOR COVID-19.  What to do if you are HIGH RISK for COVID-19?  Marland Kitchen If you are having a medical emergency, call 911. . Seek medical care right away. Before you go to a doctor's office, urgent care or emergency department, call ahead and tell them  about your recent travel, contact with someone diagnosed with COVID-19, and your symptoms. You should receive instructions from your physician's office regarding next steps of care.  . When you arrive at healthcare provider, tell the healthcare staff immediately you have returned from visiting [place], [place], [place], [place] or [place]; or traveled in the [place] to [place], [place], [place], or [place]; in the last two weeks or you have been in close contact with a person diagnosed with COVID-19 in the last 2 weeks.   . Tell the health care staff about your symptoms: fever, cough and shortness of breath. . After you have been seen by a medical provider, you will be either: o Tested for (COVID-19) and discharged home on quarantine except to seek medical care if symptoms worsen, and asked to  - Stay home and avoid contact with others until you get your results (4-5 days)  - Avoid travel on public transportation if possible (such as bus, train, or airplane) or o Sent to the Emergency Department by EMS for evaluation, COVID-19 testing, and possible admission depending on your condition and test results.  What to do if you are LOW RISK for COVID-19?  Reduce your risk of any infection by using the same precautions used for avoiding the common cold or flu:  Marland Kitchen Wash your hands often with soap and warm water for at least 20 seconds.  If soap and water are not readily available, use an alcohol-based hand sanitizer with at least 60% alcohol.  . If coughing or  sneezing, cover your mouth and nose by coughing or sneezing into the elbow areas of your shirt or coat, into a tissue or into your sleeve (not your hands). . Avoid shaking hands with others and consider head nods or verbal greetings only. . Avoid touching your eyes, nose, or mouth with unwashed hands.  . Avoid close contact with people who are sick. . Avoid places or events with large numbers of people in one location, like concerts or  sporting events. . Carefully consider travel plans you have or are making. . If you are planning any travel outside or inside the [name], visit the CDC's Travelers' Health webpage for the latest health notices. . If you have some symptoms but not all symptoms, continue to monitor at home and seek medical attention if your symptoms worsen. . If you are having a medical emergency, call 911.   [place] / e-Visit: [email or web address]         [facility]: [facility]: [phone]                   [facility]: [phone]

## 2019-11-13 ENCOUNTER — Encounter: Payer: Self-pay | Admitting: Hematology

## 2019-11-13 ENCOUNTER — Other Ambulatory Visit: Payer: Self-pay | Admitting: *Deleted

## 2019-11-13 DIAGNOSIS — C9000 Multiple myeloma not having achieved remission: Secondary | ICD-10-CM

## 2019-11-13 DIAGNOSIS — Z7189 Other specified counseling: Secondary | ICD-10-CM

## 2019-11-13 MED ORDER — ACYCLOVIR 400 MG PO TABS: ORAL_TABLET | ORAL | 0 refills | Status: DC

## 2019-11-13 NOTE — Telephone Encounter (Signed)
Requested refill of Acyclovir

## 2019-11-15 ENCOUNTER — Other Ambulatory Visit: Payer: Self-pay | Admitting: Hematology

## 2019-11-20 ENCOUNTER — Inpatient Hospital Stay: Payer: Medicare Other | Attending: Hematology

## 2019-11-20 ENCOUNTER — Other Ambulatory Visit: Payer: Self-pay | Admitting: Hematology

## 2019-11-20 ENCOUNTER — Inpatient Hospital Stay: Payer: Medicare Other

## 2019-11-20 ENCOUNTER — Other Ambulatory Visit: Payer: Self-pay

## 2019-11-20 VITALS — BP 128/80 | HR 81 | Temp 98.3°F | Resp 18

## 2019-11-20 DIAGNOSIS — Z95828 Presence of other vascular implants and grafts: Secondary | ICD-10-CM

## 2019-11-20 DIAGNOSIS — Z7189 Other specified counseling: Secondary | ICD-10-CM

## 2019-11-20 DIAGNOSIS — C9000 Multiple myeloma not having achieved remission: Secondary | ICD-10-CM

## 2019-11-20 DIAGNOSIS — Z5112 Encounter for antineoplastic immunotherapy: Secondary | ICD-10-CM | POA: Diagnosis present

## 2019-11-20 LAB — CMP (CANCER CENTER ONLY)
ALT: 23 U/L (ref 0–44)
AST: 17 U/L (ref 15–41)
Albumin: 3.5 g/dL (ref 3.5–5.0)
Alkaline Phosphatase: 78 U/L (ref 38–126)
Anion gap: 10 (ref 5–15)
BUN: 11 mg/dL (ref 8–23)
CO2: 23 mmol/L (ref 22–32)
Calcium: 9.2 mg/dL (ref 8.9–10.3)
Chloride: 106 mmol/L (ref 98–111)
Creatinine: 0.8 mg/dL (ref 0.44–1.00)
GFR, Est AFR Am: >60 mL/min (ref >60)
GFR, Estimated: >60 mL/min (ref >60)
Glucose, Bld: 210 mg/dL — ABNORMAL HIGH (ref 70–99)
Potassium: 3.6 mmol/L (ref 3.5–5.1)
Sodium: 139 mmol/L (ref 135–145)
Total Bilirubin: 0.7 mg/dL (ref 0.3–1.2)
Total Protein: 6.3 g/dL — ABNORMAL LOW (ref 6.5–8.1)

## 2019-11-20 LAB — CBC WITH DIFFERENTIAL/PLATELET
Abs Immature Granulocytes: 0.03 10*3/uL (ref 0.00–0.07)
Basophils Absolute: 0.1 10*3/uL (ref 0.0–0.1)
Basophils Relative: 1 %
Eosinophils Absolute: 0.3 10*3/uL (ref 0.0–0.5)
Eosinophils Relative: 4 %
HCT: 37.9 % (ref 36.0–46.0)
Hemoglobin: 12.8 g/dL (ref 12.0–15.0)
Immature Granulocytes: 1 %
Lymphocytes Relative: 22 %
Lymphs Abs: 1.4 10*3/uL (ref 0.7–4.0)
MCH: 32.4 pg (ref 26.0–34.0)
MCHC: 33.8 g/dL (ref 30.0–36.0)
MCV: 95.9 fL (ref 80.0–100.0)
Monocytes Absolute: 1.1 10*3/uL — ABNORMAL HIGH (ref 0.1–1.0)
Monocytes Relative: 17 %
Neutro Abs: 3.6 10*3/uL (ref 1.7–7.7)
Neutrophils Relative %: 55 %
Platelets: 208 10*3/uL (ref 150–400)
RBC: 3.95 MIL/uL (ref 3.87–5.11)
RDW: 14.3 % (ref 11.5–15.5)
WBC: 6.5 10*3/uL (ref 4.0–10.5)
nRBC: 0 % (ref 0.0–0.2)

## 2019-11-20 MED ORDER — FAMOTIDINE 20 MG PO TABS
ORAL_TABLET | ORAL | Status: AC
  Filled 2019-11-20: qty 1

## 2019-11-20 MED ORDER — ACETAMINOPHEN 325 MG PO TABS
ORAL_TABLET | ORAL | Status: AC
  Filled 2019-11-20: qty 2

## 2019-11-20 MED ORDER — SODIUM CHLORIDE 0.9 % IV SOLN
Freq: Once | INTRAVENOUS | Status: AC
  Administered 2019-11-20: 13:00:00 via INTRAVENOUS
  Filled 2019-11-20: qty 250

## 2019-11-20 MED ORDER — DIPHENHYDRAMINE HCL 25 MG PO CAPS
ORAL_CAPSULE | ORAL | Status: AC
  Filled 2019-11-20: qty 1

## 2019-11-20 MED ORDER — DEXAMETHASONE 4 MG PO TABS
20.0000 mg | ORAL_TABLET | Freq: Once | ORAL | Status: AC
  Administered 2019-11-20: 14:00:00 20 mg via ORAL

## 2019-11-20 MED ORDER — DEXTROSE 5 % IV SOLN: 70.0000 mg/m2 | Freq: Once | INTRAVENOUS | Status: DC

## 2019-11-20 MED ORDER — ACETAMINOPHEN 325 MG PO TABS
650.0000 mg | ORAL_TABLET | Freq: Once | ORAL | Status: AC
  Administered 2019-11-20: 650 mg via ORAL

## 2019-11-20 MED ORDER — PROCHLORPERAZINE MALEATE 10 MG PO TABS
10.0000 mg | ORAL_TABLET | Freq: Once | ORAL | Status: AC
  Administered 2019-11-20: 10 mg via ORAL

## 2019-11-20 MED ORDER — SODIUM CHLORIDE 0.9% FLUSH
10.0000 mL | INTRAVENOUS | Status: DC | PRN
  Administered 2019-11-20: 10 mL
  Filled 2019-11-20: qty 10

## 2019-11-20 MED ORDER — PROCHLORPERAZINE MALEATE 10 MG PO TABS
ORAL_TABLET | ORAL | Status: AC
  Filled 2019-11-20: qty 1

## 2019-11-20 MED ORDER — DIPHENHYDRAMINE HCL 25 MG PO TABS
25.0000 mg | ORAL_TABLET | Freq: Once | ORAL | Status: AC
  Administered 2019-11-20: 25 mg via ORAL
  Filled 2019-11-20: qty 1

## 2019-11-20 MED ORDER — DEXTROSE 5 % IV SOLN
71.0000 mg/m2 | Freq: Once | INTRAVENOUS | Status: AC
  Administered 2019-11-20: 130 mg via INTRAVENOUS
  Filled 2019-11-20: qty 60

## 2019-11-20 MED ORDER — FAMOTIDINE IN NACL 20-0.9 MG/50ML-% IV SOLN
INTRAVENOUS | Status: AC
  Filled 2019-11-20: qty 50

## 2019-11-20 MED ORDER — DEXAMETHASONE 4 MG PO TABS
ORAL_TABLET | ORAL | Status: AC
  Filled 2019-11-20: qty 5

## 2019-11-20 MED ORDER — ZOLEDRONIC ACID 4 MG/100ML IV SOLN
4.0000 mg | Freq: Once | INTRAVENOUS | Status: AC
  Administered 2019-11-20: 4 mg via INTRAVENOUS
  Filled 2019-11-20: qty 100

## 2019-11-20 MED ORDER — FAMOTIDINE 20 MG PO TABS
20.0000 mg | ORAL_TABLET | Freq: Once | ORAL | Status: AC
  Administered 2019-11-20: 20 mg via ORAL

## 2019-11-20 MED ORDER — SODIUM CHLORIDE 0.9 % IV SOLN
Freq: Once | INTRAVENOUS | Status: DC
  Filled 2019-11-20: qty 250

## 2019-11-20 MED ORDER — SODIUM CHLORIDE 0.9% FLUSH
10.0000 mL | Freq: Once | INTRAVENOUS | Status: AC
  Administered 2019-11-20: 13:00:00 10 mL
  Filled 2019-11-20: qty 10

## 2019-11-20 MED ORDER — HEPARIN SOD (PORK) LOCK FLUSH 100 UNIT/ML IV SOLN
500.0000 [IU] | Freq: Once | INTRAVENOUS | Status: AC | PRN
  Administered 2019-11-20: 500 [IU]
  Filled 2019-11-20: qty 5

## 2019-11-29 ENCOUNTER — Encounter: Payer: Self-pay | Admitting: Hematology

## 2019-11-30 ENCOUNTER — Other Ambulatory Visit: Payer: Self-pay | Admitting: *Deleted

## 2019-11-30 DIAGNOSIS — C9000 Multiple myeloma not having achieved remission: Secondary | ICD-10-CM

## 2019-11-30 MED ORDER — DOCUSATE SODIUM 100 MG PO CAPS: 200.0000 mg | ORAL_CAPSULE | Freq: Every day | ORAL | 1 refills | Status: DC

## 2019-12-04 ENCOUNTER — Inpatient Hospital Stay: Payer: Medicare Other | Admitting: Hematology

## 2019-12-04 ENCOUNTER — Inpatient Hospital Stay: Payer: Medicare Other

## 2019-12-04 ENCOUNTER — Other Ambulatory Visit: Payer: Self-pay

## 2019-12-04 ENCOUNTER — Encounter: Payer: Self-pay | Admitting: Hematology

## 2019-12-04 VITALS — BP 127/71 | HR 78 | Temp 98.3°F | Resp 17 | Ht 59.0 in | Wt 170.0 lb

## 2019-12-04 DIAGNOSIS — Z5112 Encounter for antineoplastic immunotherapy: Secondary | ICD-10-CM | POA: Diagnosis not present

## 2019-12-04 DIAGNOSIS — Z7189 Other specified counseling: Secondary | ICD-10-CM

## 2019-12-04 DIAGNOSIS — C9000 Multiple myeloma not having achieved remission: Secondary | ICD-10-CM

## 2019-12-04 DIAGNOSIS — Z5111 Encounter for antineoplastic chemotherapy: Secondary | ICD-10-CM | POA: Diagnosis not present

## 2019-12-04 DIAGNOSIS — M546 Pain in thoracic spine: Secondary | ICD-10-CM | POA: Diagnosis not present

## 2019-12-04 DIAGNOSIS — Z95828 Presence of other vascular implants and grafts: Secondary | ICD-10-CM

## 2019-12-04 LAB — CBC WITH DIFFERENTIAL/PLATELET
Abs Immature Granulocytes: 0.03 10*3/uL (ref 0.00–0.07)
Basophils Absolute: 0.1 10*3/uL (ref 0.0–0.1)
Basophils Relative: 1 %
Eosinophils Absolute: 0.2 10*3/uL (ref 0.0–0.5)
Eosinophils Relative: 3 %
HCT: 37.5 % (ref 36.0–46.0)
Hemoglobin: 12.5 g/dL (ref 12.0–15.0)
Immature Granulocytes: 1 %
Lymphocytes Relative: 18 %
Lymphs Abs: 1.2 10*3/uL (ref 0.7–4.0)
MCH: 32.5 pg (ref 26.0–34.0)
MCHC: 33.3 g/dL (ref 30.0–36.0)
MCV: 97.4 fL (ref 80.0–100.0)
Monocytes Absolute: 1.2 10*3/uL — ABNORMAL HIGH (ref 0.1–1.0)
Monocytes Relative: 19 %
Neutro Abs: 3.9 10*3/uL (ref 1.7–7.7)
Neutrophils Relative %: 58 %
Platelets: 260 10*3/uL (ref 150–400)
RBC: 3.85 MIL/uL — ABNORMAL LOW (ref 3.87–5.11)
RDW: 14.4 % (ref 11.5–15.5)
WBC: 6.5 10*3/uL (ref 4.0–10.5)
nRBC: 0 % (ref 0.0–0.2)

## 2019-12-04 LAB — CMP (CANCER CENTER ONLY)
ALT: 17 U/L (ref 0–44)
AST: 14 U/L — ABNORMAL LOW (ref 15–41)
Albumin: 3.4 g/dL — ABNORMAL LOW (ref 3.5–5.0)
Alkaline Phosphatase: 75 U/L (ref 38–126)
Anion gap: 12 (ref 5–15)
BUN: 11 mg/dL (ref 8–23)
CO2: 22 mmol/L (ref 22–32)
Calcium: 9.1 mg/dL (ref 8.9–10.3)
Chloride: 104 mmol/L (ref 98–111)
Creatinine: 0.83 mg/dL (ref 0.44–1.00)
GFR, Est AFR Am: >60 mL/min (ref >60)
GFR, Estimated: >60 mL/min (ref >60)
Glucose, Bld: 192 mg/dL — ABNORMAL HIGH (ref 70–99)
Potassium: 3.5 mmol/L (ref 3.5–5.1)
Sodium: 138 mmol/L (ref 135–145)
Total Bilirubin: 0.6 mg/dL (ref 0.3–1.2)
Total Protein: 6.3 g/dL — ABNORMAL LOW (ref 6.5–8.1)

## 2019-12-04 MED ORDER — SODIUM CHLORIDE 0.9% FLUSH
10.0000 mL | Freq: Once | INTRAVENOUS | Status: AC
  Administered 2019-12-04: 10 mL
  Filled 2019-12-04: qty 10

## 2019-12-04 MED ORDER — FAMOTIDINE 20 MG PO TABS
ORAL_TABLET | ORAL | Status: AC
  Filled 2019-12-04: qty 1

## 2019-12-04 MED ORDER — PROCHLORPERAZINE MALEATE 10 MG PO TABS
10.0000 mg | ORAL_TABLET | Freq: Once | ORAL | Status: AC
  Administered 2019-12-04: 10 mg via ORAL

## 2019-12-04 MED ORDER — PROCHLORPERAZINE MALEATE 10 MG PO TABS
ORAL_TABLET | ORAL | Status: AC
  Filled 2019-12-04: qty 1

## 2019-12-04 MED ORDER — DEXAMETHASONE 4 MG PO TABS
20.0000 mg | ORAL_TABLET | Freq: Once | ORAL | Status: AC
  Administered 2019-12-04: 20 mg via ORAL

## 2019-12-04 MED ORDER — FAMOTIDINE 20 MG PO TABS
20.0000 mg | ORAL_TABLET | Freq: Once | ORAL | Status: AC
  Administered 2019-12-04: 20 mg via ORAL

## 2019-12-04 MED ORDER — SODIUM CHLORIDE 0.9 % IV SOLN
Freq: Once | INTRAVENOUS | Status: DC
  Filled 2019-12-04: qty 250

## 2019-12-04 MED ORDER — SODIUM CHLORIDE 0.9 % IV SOLN
Freq: Once | INTRAVENOUS | Status: AC
  Filled 2019-12-04: qty 250

## 2019-12-04 MED ORDER — DEXTROSE 5 % IV SOLN: 71.0000 mg/m2 | Freq: Once | INTRAVENOUS | Status: DC

## 2019-12-04 MED ORDER — DEXTROSE 5 % IV SOLN
71.0000 mg/m2 | Freq: Once | INTRAVENOUS | Status: AC
  Administered 2019-12-04: 130 mg via INTRAVENOUS
  Filled 2019-12-04: qty 60

## 2019-12-04 MED ORDER — DEXAMETHASONE 4 MG PO TABS
ORAL_TABLET | ORAL | Status: AC
  Filled 2019-12-04: qty 5

## 2019-12-04 MED ORDER — SODIUM CHLORIDE 0.9% FLUSH
10.0000 mL | INTRAVENOUS | Status: DC | PRN
  Administered 2019-12-04: 10 mL
  Filled 2019-12-04: qty 10

## 2019-12-04 MED ORDER — HEPARIN SOD (PORK) LOCK FLUSH 100 UNIT/ML IV SOLN
500.0000 [IU] | Freq: Once | INTRAVENOUS | Status: AC | PRN
  Administered 2019-12-04: 500 [IU]
  Filled 2019-12-04: qty 5

## 2019-12-04 MED ORDER — ACETAMINOPHEN 325 MG PO TABS
650.0000 mg | ORAL_TABLET | Freq: Once | ORAL | Status: AC
  Administered 2019-12-04: 650 mg via ORAL

## 2019-12-04 MED ORDER — ACETAMINOPHEN 325 MG PO TABS
ORAL_TABLET | ORAL | Status: AC
  Filled 2019-12-04: qty 2

## 2019-12-04 MED ORDER — DIPHENHYDRAMINE HCL 25 MG PO CAPS
25.0000 mg | ORAL_CAPSULE | Freq: Once | ORAL | Status: AC
  Administered 2019-12-04: 25 mg via ORAL

## 2019-12-04 MED ORDER — DIPHENHYDRAMINE HCL 25 MG PO CAPS
ORAL_CAPSULE | ORAL | Status: AC
  Filled 2019-12-04: qty 1

## 2019-12-04 NOTE — Progress Notes (Signed)
HEMATOLOGY/ONCOLOGY CLINIC NOTE  Date of Service: 12/04/2019  Patient Care Team: Hoyt Koch, MD as PCP - General (Internal Medicine) Lafayette Dragon, MD (Inactive) as Consulting Physician (Gastroenterology) Megan Salon, MD as Consulting Physician (Gynecology) Melrose Nakayama, MD as Consulting Physician (Orthopedic Surgery) Deneise Lever, MD as Consulting Physician (Pulmonary Disease) [name], MD (Ophthalmology)  CHIEF COMPLAINTS/PURPOSE OF CONSULTATION:  Continue mx of myeloma  HISTORY OF PRESENTING ILLNESS:   Faith Orr is a wonderful 78 y.o. female who has been referred to [name] by Dr. Pricilla Holm for evaluation and management of Lytic bone lesions. She is accompanied today by her son in law, and her partner is present via phone. The pt reports that she is doing well overall.   The pt notes that 2-3 months ago while standing at the stove, and otherwise feeling normally, she felt "something snap that took her breath away" in her mid back as she stretched to get something. The pt notes that she saw a chiropractor twice due to her back stiffness, which did not help. The pt then described her new bone pains to her PCP on 12/05/18, and subsequent imaging, as noted below, revealed concerns for numerous bone lesions. She has begun 58m Fosamax. The pt notes that she had hip pain in 2018, and that an XR at that time did not reveal any lesions.  The pt notes that most of her pain is concentrated to her left shoulder presently, and with minimal movement of the arm. She endorses pain radiating into her left arm and notes that her hand is swollen in the mornings when she wakes up. She also endorses present back pain and right hip pain, worse when she walks. She has not yet seen orthopedics. The pt reports that she is needing to take 605mAdvil every 4-6 hours as Tramadol alone has not been able to alleviate her pain. The pt denies any unexpected weight loss,  fevers, chills, or night sweats. The pt notes that her urine is a very dark color presently, but denies overt blood in the urine, underpants, nor tissue paper. The pt notes that in the last two weeks she has had some soreness in her head, but denies new headaches or changes in vision. The pt notes that she has been urinating more frequently overall, but has been trying to stay better hydrated as well.  The pt notes that she has been compliant with annual mammograms.   The pt notes that she had a cyst in her right breast which was removed in the past. She fractured her left wrist in 2008 after falling down stairs. The pt endorses history of fatty liver.   The pt denies ever smoking cigarettes and endorses significant second hand smoke exposure with a previous marriage.   Of note prior to the patient's visit today, pt has had a Bone Scan completed on 12/13/18 with results revealing Multifocal uptake throughout the skeleton, consistent with diffuse metastatic disease. Primary tumor is not specified. 2. Uptake in the proximal right femur, consistent with lytic lesions. 3. Uptake in the ribs bilaterally as described. 4. Lesions in the proximal left humerus. 5. Diffuse uptake throughout the skull consistent with metastatic disease. 6. Right paramedian uptake at the manubrium.  Most recent lab results (12/08/18) of CBC w/diff and CMP is as follows: all values are WNL except for Glucose at 279, BUN at 24, AST at 41, ALT at 46. 12/08/18 SPEP revealed all values WNL except for Total  Protein at 6.0, Albumin at 3.6, Gamma globulin at 0.7, and M spike at 0.5g  On review of systems, pt reports significant left shoulder pain, back pain, right hip pain, dark urine, and denies fevers, chills, night sweats, unexpected weight loss, changes in bowel habits, changes in breathing, cough, new respiratory symptoms, changes in vision, abdominal pains, leg swelling, and any other symptoms.   On PMHx the pt reports fatty liver,  and denies blood clots.  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 Denies ever smoking.  On Family Hx the pt reports maternal grandmother with colon cancer. Father with bladder cancer and amyloidosis (pt notes that this could have been misdiagnosis). Mother with Protein S deficiency and polymyalgia rheumatica.  Current Treatment: cycle 11 day 15   Interval History:   Faith Orr returns today for management and evaluation of her Multiple Myeloma and C11D15 of maintenance Carfilzomib, Revlimid, and Dexamethasone. The patient's last visit with [name] was on 11/02/2019. The pt reports that she is doing well overall.  The pt reports increased pain in her spine, across her upper back and in both of her legs. This pain is not located in any particular spots and does not seem to shoot down her legs from her spine. She has not increased the dose of her pain medication but notes that her pain is between a 3-4 most of the time. The discomfort is improved when she is moving around. Her low back is not painful but it is is very tight.   She has no other concerns and has been doing well otherwise. She has no issues tolerating Carfilzomib at the current dose. Pt is currently taking her Vitamin D as prescribed and has continued to receive her Zometa injections. Pt is interested in receiving the Covid-19 when available.   Lab results today (12/04/19) of CBC w/diff and CMP is as follows: all values are WNL except for RBC at 3.85, Mono Abs at 1.2K, Glucose at 192, Total Protein at 6.3, Albumin at 3.4, AST at 14.  On review of systems, pt reports new spinal/upper back and leg pain and denies breathing changes, SOB, fevers, chills, mouth sores, tinging/numbness in hands/feet and any other symptoms.   MEDICAL HISTORY:  Past Medical History:  Diagnosis Date  . Allergy    seasonal  . Asthma   . DEPRESSION   . DIABETES MELLITUS, TYPE II   . Diverticulosis   .  HYPERLIPIDEMIA   . Macular degeneration of left eye    mild, Dr.Hecker  . Obesity, unspecified   . Osteoarthritis of both knees   . OSTEOPENIA   . Osteopenia   . URINARY INCONTINENCE     SURGICAL HISTORY: Past Surgical History:  Procedure Laterality Date  . CATARACT EXTRACTION Left 05/24/2018  . CESAREAN SECTION  01/1973  . CYSTOSCOPY/URETEROSCOPY/HOLMIUM LASER/STENT PLACEMENT Right 09/20/2019   Procedure: CYSTOSCOPY/URETEROSCOPY/HOLMIUM LASER/STENT PLACEMENT;  Surgeon: Lucas Mallow, MD;  Location: [facility];  Service: Urology;  Laterality: Right;  . FRACTURE SURGERY    . IR IMAGING GUIDED PORT INSERTION  02/20/2019  . left wrist surgery  2008   By Dr. Latanya Maudlin  . right ankle  1994    SOCIAL HISTORY: Social History   Socioeconomic History  . Marital status: Married    Spouse name: Not on file  . Number of children: 1  . Years of education: Not on file  . Highest education level: Not on file  Occupational History    Employer: [employer]  Tobacco Use  . Smoking status: Never Smoker  . Smokeless tobacco: Never Used  . Tobacco comment: Lives with partner [name]) and son  Substance and Sexual Activity  . Alcohol use: No    Alcohol/week: 0.0 standard drinks  . Drug use: No  . Sexual activity: Never    Partners: Female    Birth control/protection: Post-menopausal    Comment: Lives with female partner (annette hicks) and 85 yo son  Other Topics Concern  . Not on file  Social History Narrative  . Not on file   Social Determinants of Health   Financial Resource Strain:   . Difficulty of Paying Living Expenses: Not on file  Food Insecurity:   . Worried About Charity fundraiser in the Last Year: Not on file  . Ran Out of Food in the Last Year: Not on file  Transportation Needs:   . Lack of Transportation (Medical): Not on file  . Lack of Transportation (Non-Medical): Not on file  Physical Activity:   . Days of Exercise per  Week: Not on file  . Minutes of Exercise per Session: Not on file  Stress:   . Feeling of Stress : Not on file  Social Connections:   . Frequency of Communication with Friends and Family: Not on file  . Frequency of Social Gatherings with Friends and Family: Not on file  . Attends Religious Services: Not on file  . Active Member of Clubs or Organizations: Not on file  . Attends Archivist Meetings: Not on file  . Marital Status: Not on file  Intimate Partner Violence:   . Fear of Current or Ex-Partner: Not on file  . Emotionally Abused: Not on file  . Physically Abused: Not on file  . Sexually Abused: Not on file    FAMILY HISTORY: Family History  Problem Relation Age of Onset  . Diabetes Father   . Hyperlipidemia Father   . Heart disease Father   . Cancer Father   . Hypertension Father   . Colon cancer Paternal Grandmother 3  . Osteoporosis Mother   . Protein S deficiency Mother   . Hyperlipidemia Mother   . Multiple sclerosis Daughter   . Cancer Other        bladder  . Breast cancer Neg Hx     ALLERGIES:  is allergic to penicillins; aleve [naproxen sodium]; and sulfonamide derivatives.  MEDICATIONS:  Current Outpatient Medications  Medication Sig Dispense Refill  . acyclovir (ZOVIRAX) 400 MG tablet TAKE 1 TABLET(400 MG) BY MOUTH TWICE DAILY 60 tablet 0  . aspirin EC 81 MG tablet Take 81 mg by mouth daily after breakfast.     . Blood Glucose Monitoring Suppl (FREESTYLE FREEDOM LITE) W/DEVICE KIT Use to check blood sugars twice a day Dx 250.00 1 each 0  . Calcium Carbonate-Vitamin D (CALCIUM 600+D HIGH POTENCY) 600-400 MG-UNIT per tablet Take 1 tablet by mouth 2 (two) times daily.     . Cetirizine HCl 10 MG CAPS Take 1 capsule (10 mg total) by mouth daily. 30 capsule 1  . dexamethasone (DECADRON) 4 MG tablet Take 5 tablets (20 mg total) by mouth once a week. On D22 of each cycle of treatment 20 tablet 5  . docusate sodium (DOK) 100 MG capsule Take 2 capsules  (200 mg total) by mouth at bedtime. 60 capsule 1  . fentaNYL (DURAGESIC) 12 MCG/HR Place 1 patch onto the skin every  3 (three) days. 10 patch 0  . fluticasone (FLONASE) 50 MCG/ACT nasal spray Place 1 spray into both nostrils daily. (Patient taking differently: Place 1 spray into both nostrils daily as needed for allergies or rhinitis. ) 16 g 2  . glipiZIDE (GLUCOTROL XL) 5 MG 24 hr tablet TAKE 1 TABLET(5 MG) BY MOUTH DAILY WITH BREAKFAST 90 tablet 1  . glucose blood (FREESTYLE LITE) test strip CHECK BLOOD SUGAR TWICE DAILY AS DIRECTED Dx 250.00 180 each 3  . Lancets (FREESTYLE) lancets Use twice daily to check sugars. 100 each 11  . lenalidomide (REVLIMID) 15 MG capsule TAKE 1 CAPSULE BY MOUTH  DAILY FOR 21 DAYS ON, THEN  7 DAYS OFF 21 capsule 0  . lidocaine-prilocaine (EMLA) cream APPLY 1 APPLICATION TO THE AFFECTED AREA AS NEEDED. USE PRIOR TO PORT ACCESS 30 g 0  . metFORMIN (GLUCOPHAGE-XR) 500 MG 24 hr tablet TAKE 3 TABLETS(1500 MG) BY MOUTH DAILY WITH BREAKFAST (Patient taking differently: Take 1,000 mg by mouth daily. ) 270 tablet 1  . Multiple Vitamins-Minerals (ICAPS) CAPS Take 1 capsule by mouth daily after breakfast.     . ondansetron (ZOFRAN) 8 MG tablet Take 1 tablet (8 mg total) by mouth 2 (two) times daily as needed (Nausea or vomiting). 30 tablet 1  . Oxycodone HCl 10 MG TABS Take 1 tablet (10 mg total) by mouth every 6 (six) hours as needed. 90 tablet 0  . pantoprazole (PROTONIX) 20 MG tablet TAKE 1 TABLET(20 MG) BY MOUTH DAILY 30 tablet 5  . polyethylene glycol (MIRALAX / GLYCOLAX) packet Take 17 g by mouth daily after breakfast.     . prochlorperazine (COMPAZINE) 10 MG tablet Take 1 tablet (10 mg total) by mouth every 6 (six) hours as needed (Nausea or vomiting). 30 tablet 1  . sertraline (ZOLOFT) 50 MG tablet TAKE 1 TABLET BY MOUTH DAILY 90 tablet 0  . simvastatin (ZOCOR) 20 MG tablet TAKE 1 TABLET(20 MG) BY MOUTH DAILY 90 tablet 0  . Triamcinolone Acetonide 0.025 % LOTN Apply 1  application topically 3 (three) times daily as needed (rash/itching). 60 mL 2  . Vitamin D, Ergocalciferol, (DRISDOL) 1.25 MG (50000 UT) CAPS capsule TAKE 1 CAPSULE BY MOUTH EVERY 7 DAYS 12 capsule 0  . LORazepam (ATIVAN) 0.5 MG tablet Take 1 tablet (0.5 mg total) by mouth every 8 (eight) hours as needed for anxiety (significant essential tremors). (Patient not taking: Reported on 12/04/2019) 60 tablet 0   No current facility-administered medications for this visit.   Facility-Administered Medications Ordered in Other Visits  Medication Dose Route Frequency Provider Last Rate Last Admin  . heparin lock flush 100 unit/mL  500 Units Intracatheter Once PRN Brunetta Genera, MD      . sodium chloride flush (NS) 0.9 % injection 10 mL  10 mL Intracatheter PRN Brunetta Genera, MD   10 mL at 10/02/19 1524    REVIEW OF SYSTEMS:   A 10+ POINT REVIEW OF SYSTEMS WAS OBTAINED including neurology, dermatology, psychiatry, cardiac, respiratory, lymph, extremities, GI, GU, Musculoskeletal, constitutional, breasts, reproductive, HEENT.  All pertinent positives are noted in the HPI.  All others are negative.   PHYSICAL EXAMINATION: ECOG FS:2 - Symptomatic, <50% confined to bed  Vitals:   12/04/19 1438  BP: 127/71  Pulse: 78  Resp: 17  Temp: 98.3 F (36.8 C)  SpO2: 100%   Wt Readings from Last 3 Encounters:  12/04/19 170 lb (77.1 kg)  11/02/19 172 lb 11.2 oz (78.3 kg)  10/09/19  170 lb 6.4 oz (77.3 kg)   Body mass index is 34.34 kg/m.    Exam was given in a chair   GENERAL:alert, in no acute distress and comfortable SKIN: no acute rashes, no significant lesions EYES: conjunctiva are pink and non-injected, sclera anicteric OROPHARYNX: MMM, no exudates, no oropharyngeal erythema or ulceration NECK: supple, no JVD LYMPH:  no palpable lymphadenopathy in the cervical, axillary or inguinal regions LUNGS: clear to auscultation b/l with normal respiratory effort HEART: regular rate &  rhythm ABDOMEN:  normoactive bowel sounds , non tender, not distended. No palpable hepatosplenomegaly.  Extremity: no pedal edema PSYCH: alert & oriented x 3 with fluent speech NEURO: no focal motor/sensory deficits  LABORATORY DATA:  I have reviewed the data as listed  . CBC Latest Ref Rng & Units 12/04/2019 11/20/2019 11/02/2019  WBC 4.0 - 10.5 K/uL 6.5 6.5 5.2  Hemoglobin 12.0 - 15.0 g/dL 12.5 12.8 11.9(L)  Hematocrit 36.0 - 46.0 % 37.5 37.9 36.1  Platelets 150 - 400 K/uL 260 208 218   . CBC    Component Value Date/Time   WBC 6.5 12/04/2019 1327   RBC 3.85 (L) 12/04/2019 1327   HGB 12.5 12/04/2019 1327   HGB 12.8 02/16/2019 1138   HCT 37.5 12/04/2019 1327   PLT 260 12/04/2019 1327   PLT 170 02/16/2019 1138   MCV 97.4 12/04/2019 1327   MCH 32.5 12/04/2019 1327   MCHC 33.3 12/04/2019 1327   RDW 14.4 12/04/2019 1327   LYMPHSABS 1.2 12/04/2019 1327   MONOABS 1.2 (H) 12/04/2019 1327   EOSABS 0.2 12/04/2019 1327   BASOSABS 0.1 12/04/2019 1327    . CMP Latest Ref Rng & Units 12/04/2019 11/20/2019 11/02/2019  Glucose 70 - 99 mg/dL 192(H) 210(H) 210(H)  BUN 8 - 23 mg/dL _0 Creatinine 0.44 - 1.00 mg/dL 0.83 0.80 0.83  Sodium 135 - 145 mmol/L 138 139 140  Potassium 3.5 - 5.1 mmol/L 3.5 3.6 3.9  Chloride 98 - 111 mmol/L 104 106 106  CO2 22 - 32 mmol/L _1 Calcium 8.9 - 10.3 mg/dL 9.1 9.2 9.0  Total Protein 6.5 - 8.1 g/dL 6.3(L) 6.3(L) 6.2(L)  Total Bilirubin 0.3 - 1.2 mg/dL 0.6 0.7 0.8  Alkaline Phos 38 - 126 U/L 75 78 81  AST 15 - 41 U/L 14(L) 17 19  ALT 0 - 44 U/L _2 09/18/2019 BM Bx Report ([id number])   09/18/2019 FISH Panel    05/30/2019 BM Bx   01/06/2019 BM Bx:     01/06/19 Cytogenetics:      05/30/19 BM Biopsy:   09/18/2019 FISH Panel    09/18/2019 BM Surgical Pathology ([id number])     RADIOGRAPHIC STUDIES: I have personally reviewed the radiological images as listed and agreed with the findings in the  report. No results found.  ASSESSMENT & PLAN:   78 y.o. female with  1. Recently diagnosed Multiple Myeloma, RISS Stage III  Labs upon initial presentation from 12/08/18, blood counts are normal including WBC at 7.1k, HGB at 13.1, and PLT at 245k. Calcium normal at 10.3. Creatinine normal at 0.63. M spike at 0.5g. 12/13/18 Bone Scan revealed Multifocal uptake throughout the skeleton, consistent with diffuse metastatic disease. Primary tumor is not specified. 2. Uptake in the proximal right femur, consistent with lytic lesions. 3. Uptake in the ribs bilaterally as described. 4. Lesions in the proximal left humerus. 5. Diffuse uptake throughout the skull consistent with metastatic disease. 6. Right paramedian  uptake at the manubrium.  12/13/18 CT Right Femur revealed Numerous lytic lesions involving the right femur and a lytic lesion in the left inferior pubic ramus. Overall appearance is most concerning for multiple myeloma  12/27/18 Pretreatment 24hour UPEP observed an M spike at 11m, and showed 196mtotal protein/day.  12/27/18 Pretreatment MMP revealed M Protein at 0.5g with IgG Lambda specificity. Kappa:Lambda light chain ratio at 0.13, with Lambda at 40.3. There is less abnormal protein and light chains than I would expect from 30% plasma cells, which suggests hypo-secretory or non-secretory neoplastic plasma cells. Will have an impact in assessing response. 01/05/19 PET/CT revealed Innumerable lytic lesions in the skeleton compatible with myeloma. Most of the larger lesions are hypermetabolic, for example including a left proximal humeral shaft lesion with maximum SUV of 8.1 and a 2.8 cm lesion in the left T9 vertebral body with maximum SUV 5.1. Most of the smaller lytic lesions, and some of the larger lesions, do not demonstrate accentuated metabolic activity. 2. 1.2 cm in short axis lymph node in the left parapharyngeal space is hypermetabolic with maximum SUV 11.8. I do not see a separate  mass in the head and neck to give rise to this hypermetabolic lymph node. 3. Mosaic attenuation in the lower lobes, nonspecific possibly from air trapping. 4.  Aortic Atherosclerosis 5. Heterogeneous activity in the liver, making it hard to exclude small liver lesions. Consider hepatic protocol MRI with and without contrast for definitive assessment. Nonobstructive right nephrolithiasis. Old granulomatous disease  01/06/19 Bone Marrow biopsy revealed interstitial increase in plasma cells (28% aspirate, 40% CD138 immunohistochemistry). Plasma cells negative for light chains consistent with a non or weakly secretory myeloma   01/06/19 Cytogenetics revealed 37% of cells with trisomy 11 or 11q deletion, and 40.5% of cells with 17p mutation  S/p 5 cycles of KRD treatment  05/31/19 BM Biopsy revealed mild atypical plasmacytosis at 5% with polytypic variation.   06/01/19 PET/CT revealed "Dominant lesion in the LEFT humerus is decreased significantly in metabolic activity. Additional hypermetabolic skeletal lytic lesions have decreased in metabolic activity or similar to comparison exam (01/05/2019). No evidence of disease progression. 2. Multiple additional lytic lesions do not have metabolic activity and unchanged. 3. No new skeletal lesions are identified. No soft tissue plasmacytoma identified. 4. Nodule / node in the LEFT parapharyngeal space which is intensely hypermetabolic not changed from prior. 5. New hypermetabolic LEFT lower lobe pulmonary nodule is indeterminate. Recommend close attention on follow-up 6. New obstructive hydronephrosis of the RIGHT kidney related to RIGHT UPJ stone."  09/18/2019 BM Bx Report which revealed "Slightly hypercellular bone marrow for age with trilineage hematopoiesis and 1% plasma cells."  09/14/2019 PET/CT Whole Body Scan ([id number] revealed "1. There widespread tiny lytic lesions compatible with multiple myeloma. Index larger lesions are generally similar to the  prior exam, with low-grade activity such as the left T9 vertebral body lesion with maximum SUV 4.5. Is mild increase in the activity associated with a mildly sclerotic left proximal humeral lesion, maximum SUV 4.8 (previously 3.5). 2. At the site of the prior left lower lobe nodule is currently more bandlike thickening, with maximum SUV only 1.9, probably benign, continued surveillance of this region suggested. 3. There several small but hypermetabolic lymph nodes. This includes a left parapharyngeal space node measuring 1.0 cm with maximum SUV 12.3 (stable); a left level IB lymph node measuring 0.5 cm with maximum SUV 4.8 (slightly larger than prior); and a left inguinal lymph node measuring 0.7 cm in  short axis with maximum SUV 6.4 (previously 0.5 cm with maximum SUV 0.6). Significance of these lymph nodes uncertain, surveillance is recommended. 4. New 5 mm left lower lobe subpleural nodule on image 32/8, not appreciably hypermetabolic, surveillance suggested. 5. Focal subcutaneous stranding along the left perineum measuring about 2.6 by 1.1 cm on image 221/4, maximum SUV 12.5. This was not present previously and is most likely inflammatory, although given the notable SUV, surveillance of this region is suggested. 6. Other imaging findings of potential clinical significance: Aortic Atherosclerosis (ICD10-I70.0). Coronary atherosclerosis. Old granulomatous disease. Mild right hydronephrosis due to a 7 mm right UPJ calculus. 2 mm right kidney upper pole nonobstructive renal calculus. Prominent stool throughout the colon favors constipation."  2. 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  01/17/19 MRI Liver revealed Several appreciable liver lesions all have benign imaging characteristics. No MRI findings of metastatic involvement of the liver. 2. Scattered bony lesions corresponding to the lytic lesions seen at PET-CT,  compatible with active myeloma. 3. Aortic Atherosclerosis.  Mild cardiomegaly. 4. Diffuse hepatic steatosis.   3. Left lower lobe pulmonary nodule First seen on 06/01/19 PET/CT  PLAN: -Discussed pt labwork today, 12/04/19; blood counts and chemistries are good. 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  -Continue current dose of Revlimd -Will get a Thoracic and Lumbar spine MRI in 1 week due to back pain to r/o myeloma related etiology. -Refill Fentanyl Patch -Will see back in 2 weeks with labs  FOLLOW UP: MRI thoracic and Lumbar spine in 1 week Please schedule next 4 doses of maintenance Carfilzomib with labs and portflush. MD visit in 2 weeks  The total time spent in the appt was 25 minutes and more than 50% was on counseling and direct patient cares.  All of the patient's questions were answered with apparent satisfaction. The patient knows to call the clinic with any problems, questions or concerns.    Sullivan Lone MD [place] AAHIVMS [facility] Hematology/Oncology Physician [facility]  (Office):       [phone] (Work cell):  [phone] (Fax):           [phone]  12/04/2019 3:11 PM  I, Yevette Edwards, am acting as a scribe for Dr. Sullivan Lone.   .I have reviewed the above documentation for accuracy and completeness, and I agree with the above. Brunetta Genera MD

## 2019-12-04 NOTE — Patient Instructions (Signed)
[facility] Discharge Instructions for Patients Receiving Chemotherapy  Today you received the following chemotherapy agents Carfilzomib (KYPROLIS).  To help prevent nausea and vomiting after your treatment, we encourage you to take your nausea medication as prescribed.  If you develop nausea and vomiting that is not controlled by your nausea medication, call the clinic.   BELOW ARE SYMPTOMS THAT SHOULD BE REPORTED IMMEDIATELY:  *FEVER GREATER THAN 100.5 F  *CHILLS WITH OR WITHOUT FEVER  NAUSEA AND VOMITING THAT IS NOT CONTROLLED WITH YOUR NAUSEA MEDICATION  *UNUSUAL SHORTNESS OF BREATH  *UNUSUAL BRUISING OR BLEEDING  TENDERNESS IN MOUTH AND THROAT WITH OR WITHOUT PRESENCE OF ULCERS  *URINARY PROBLEMS  *BOWEL PROBLEMS  UNUSUAL RASH Items with * indicate a potential emergency and should be followed up as soon as possible.  Feel free to call the clinic should you have any questions or concerns. The clinic phone number is [phone].  Please show the CHEMO ALERT CARD at check-in to the Emergency Department and triage nurse.   

## 2019-12-05 ENCOUNTER — Telehealth: Payer: Self-pay | Admitting: Obstetrics and Gynecology

## 2019-12-05 ENCOUNTER — Other Ambulatory Visit: Payer: Self-pay | Admitting: *Deleted

## 2019-12-05 ENCOUNTER — Telehealth: Payer: Self-pay | Admitting: Hematology

## 2019-12-05 MED ORDER — FENTANYL 12 MCG/HR TD PT72: 1.0000 | MEDICATED_PATCH | TRANSDERMAL | 0 refills | Status: DC

## 2019-12-05 NOTE — Telephone Encounter (Signed)
Left message on voicemail to call and reschedule cancelled appointment. °

## 2019-12-05 NOTE — Telephone Encounter (Signed)
Request refill for Fentanyl Patch

## 2019-12-05 NOTE — Telephone Encounter (Signed)
Scheduled appt per 12/21 los.  Left a vm of the appt date and time.

## 2019-12-09 ENCOUNTER — Encounter: Payer: Self-pay | Admitting: Hematology

## 2019-12-11 ENCOUNTER — Other Ambulatory Visit: Payer: Self-pay | Admitting: Medical

## 2019-12-11 MED ORDER — OXYCODONE HCL 10 MG PO TABS: 10.0000 mg | ORAL_TABLET | Freq: Four times a day (QID) | ORAL | 0 refills | Status: DC | PRN

## 2019-12-17 ENCOUNTER — Encounter: Payer: Self-pay | Admitting: Hematology

## 2019-12-18 ENCOUNTER — Other Ambulatory Visit: Payer: Self-pay | Admitting: *Deleted

## 2019-12-18 ENCOUNTER — Inpatient Hospital Stay (HOSPITAL_BASED_OUTPATIENT_CLINIC_OR_DEPARTMENT_OTHER): Payer: Medicare PPO | Admitting: Hematology

## 2019-12-18 ENCOUNTER — Other Ambulatory Visit: Payer: Self-pay

## 2019-12-18 ENCOUNTER — Inpatient Hospital Stay: Payer: Medicare PPO

## 2019-12-18 ENCOUNTER — Inpatient Hospital Stay: Payer: Medicare PPO | Attending: Hematology

## 2019-12-18 VITALS — BP 133/73 | HR 83 | Temp 98.2°F | Resp 18 | Ht 59.0 in | Wt 169.1 lb

## 2019-12-18 DIAGNOSIS — M546 Pain in thoracic spine: Secondary | ICD-10-CM

## 2019-12-18 DIAGNOSIS — Z95828 Presence of other vascular implants and grafts: Secondary | ICD-10-CM

## 2019-12-18 DIAGNOSIS — C9000 Multiple myeloma not having achieved remission: Secondary | ICD-10-CM

## 2019-12-18 DIAGNOSIS — Z5111 Encounter for antineoplastic chemotherapy: Secondary | ICD-10-CM

## 2019-12-18 DIAGNOSIS — Z7189 Other specified counseling: Secondary | ICD-10-CM

## 2019-12-18 DIAGNOSIS — Z5112 Encounter for antineoplastic immunotherapy: Secondary | ICD-10-CM | POA: Insufficient documentation

## 2019-12-18 DIAGNOSIS — R6883 Chills (without fever): Secondary | ICD-10-CM

## 2019-12-18 LAB — CMP (CANCER CENTER ONLY)
ALT: 24 U/L (ref 0–44)
AST: 23 U/L (ref 15–41)
Albumin: 3.4 g/dL — ABNORMAL LOW (ref 3.5–5.0)
Alkaline Phosphatase: 62 U/L (ref 38–126)
Anion gap: 14 (ref 5–15)
BUN: 10 mg/dL (ref 8–23)
CO2: 22 mmol/L (ref 22–32)
Calcium: 9.4 mg/dL (ref 8.9–10.3)
Chloride: 103 mmol/L (ref 98–111)
Creatinine: 0.76 mg/dL (ref 0.44–1.00)
GFR, Est AFR Am: >60 mL/min (ref >60)
GFR, Estimated: >60 mL/min (ref >60)
Glucose, Bld: 185 mg/dL — ABNORMAL HIGH (ref 70–99)
Potassium: 3.1 mmol/L — ABNORMAL LOW (ref 3.5–5.1)
Sodium: 139 mmol/L (ref 135–145)
Total Bilirubin: 1.1 mg/dL (ref 0.3–1.2)
Total Protein: 5.9 g/dL — ABNORMAL LOW (ref 6.5–8.1)

## 2019-12-18 LAB — CBC WITH DIFFERENTIAL/PLATELET
Abs Immature Granulocytes: 0.03 10*3/uL (ref 0.00–0.07)
Basophils Absolute: 0.1 10*3/uL (ref 0.0–0.1)
Basophils Relative: 1 %
Eosinophils Absolute: 0.4 10*3/uL (ref 0.0–0.5)
Eosinophils Relative: 7 %
HCT: 37.7 % (ref 36.0–46.0)
Hemoglobin: 12.6 g/dL (ref 12.0–15.0)
Immature Granulocytes: 1 %
Lymphocytes Relative: 19 %
Lymphs Abs: 1.2 10*3/uL (ref 0.7–4.0)
MCH: 32.7 pg (ref 26.0–34.0)
MCHC: 33.4 g/dL (ref 30.0–36.0)
MCV: 97.9 fL (ref 80.0–100.0)
Monocytes Absolute: 1.2 10*3/uL — ABNORMAL HIGH (ref 0.1–1.0)
Monocytes Relative: 20 %
Neutro Abs: 3.1 10*3/uL (ref 1.7–7.7)
Neutrophils Relative %: 52 %
Platelets: 180 10*3/uL (ref 150–400)
RBC: 3.85 MIL/uL — ABNORMAL LOW (ref 3.87–5.11)
RDW: 13.8 % (ref 11.5–15.5)
WBC: 6 10*3/uL (ref 4.0–10.5)
nRBC: 0 % (ref 0.0–0.2)

## 2019-12-18 MED ORDER — DIPHENHYDRAMINE HCL 25 MG PO CAPS
ORAL_CAPSULE | ORAL | Status: AC
  Filled 2019-12-18: qty 1

## 2019-12-18 MED ORDER — DIPHENHYDRAMINE HCL 25 MG PO CAPS
25.0000 mg | ORAL_CAPSULE | Freq: Once | ORAL | Status: AC
  Administered 2019-12-18: 25 mg via ORAL

## 2019-12-18 MED ORDER — FAMOTIDINE 20 MG PO TABS
ORAL_TABLET | ORAL | Status: AC
  Filled 2019-12-18: qty 1

## 2019-12-18 MED ORDER — FAMOTIDINE 20 MG PO TABS
20.0000 mg | ORAL_TABLET | Freq: Once | ORAL | Status: AC
  Administered 2019-12-18: 20 mg via ORAL

## 2019-12-18 MED ORDER — SODIUM CHLORIDE 0.9 % IV SOLN
Freq: Once | INTRAVENOUS | Status: AC
  Filled 2019-12-18: qty 250

## 2019-12-18 MED ORDER — PROCHLORPERAZINE MALEATE 10 MG PO TABS
ORAL_TABLET | ORAL | Status: AC
  Filled 2019-12-18: qty 1

## 2019-12-18 MED ORDER — DEXTROSE 5 % IV SOLN
71.0000 mg/m2 | Freq: Once | INTRAVENOUS | Status: AC
  Administered 2019-12-18: 130 mg via INTRAVENOUS
  Filled 2019-12-18: qty 5

## 2019-12-18 MED ORDER — ACETAMINOPHEN 325 MG PO TABS
650.0000 mg | ORAL_TABLET | Freq: Once | ORAL | Status: AC
  Administered 2019-12-18: 14:00:00 650 mg via ORAL

## 2019-12-18 MED ORDER — ZOLEDRONIC ACID 4 MG/100ML IV SOLN
4.0000 mg | Freq: Once | INTRAVENOUS | Status: AC
  Administered 2019-12-18: 4 mg via INTRAVENOUS
  Filled 2019-12-18: qty 100

## 2019-12-18 MED ORDER — SODIUM CHLORIDE 0.9% FLUSH
10.0000 mL | Freq: Once | INTRAVENOUS | Status: AC
  Administered 2019-12-18: 10 mL
  Filled 2019-12-18: qty 10

## 2019-12-18 MED ORDER — ACYCLOVIR 400 MG PO TABS: ORAL_TABLET | ORAL | 1 refills | Status: DC

## 2019-12-18 MED ORDER — ACETAMINOPHEN 325 MG PO TABS
ORAL_TABLET | ORAL | Status: AC
  Filled 2019-12-18: qty 2

## 2019-12-18 MED ORDER — PROCHLORPERAZINE MALEATE 10 MG PO TABS
10.0000 mg | ORAL_TABLET | Freq: Once | ORAL | Status: AC
  Administered 2019-12-18: 14:00:00 10 mg via ORAL

## 2019-12-18 MED ORDER — HEPARIN SOD (PORK) LOCK FLUSH 100 UNIT/ML IV SOLN
500.0000 [IU] | Freq: Once | INTRAVENOUS | Status: AC | PRN
  Administered 2019-12-18: 15:00:00 500 [IU]
  Filled 2019-12-18: qty 5

## 2019-12-18 MED ORDER — SODIUM CHLORIDE 0.9% FLUSH
10.0000 mL | INTRAVENOUS | Status: DC | PRN
  Administered 2019-12-18: 15:00:00 10 mL
  Filled 2019-12-18: qty 10

## 2019-12-18 MED ORDER — DEXAMETHASONE 4 MG PO TABS
ORAL_TABLET | ORAL | Status: AC
  Filled 2019-12-18: qty 5

## 2019-12-18 MED ORDER — DEXAMETHASONE 4 MG PO TABS
20.0000 mg | ORAL_TABLET | Freq: Once | ORAL | Status: AC
  Administered 2019-12-18: 20 mg via ORAL

## 2019-12-18 NOTE — Patient Instructions (Signed)
[facility] Discharge Instructions for Patients Receiving Chemotherapy  Today you received the following chemotherapy agents Carfilzomib (KYPROLIS).  To help prevent nausea and vomiting after your treatment, we encourage you to take your nausea medication as prescribed.  If you develop nausea and vomiting that is not controlled by your nausea medication, call the clinic.   BELOW ARE SYMPTOMS THAT SHOULD BE REPORTED IMMEDIATELY:  *FEVER GREATER THAN 100.5 F  *CHILLS WITH OR WITHOUT FEVER  NAUSEA AND VOMITING THAT IS NOT CONTROLLED WITH YOUR NAUSEA MEDICATION  *UNUSUAL SHORTNESS OF BREATH  *UNUSUAL BRUISING OR BLEEDING  TENDERNESS IN MOUTH AND THROAT WITH OR WITHOUT PRESENCE OF ULCERS  *URINARY PROBLEMS  *BOWEL PROBLEMS  UNUSUAL RASH Items with * indicate a potential emergency and should be followed up as soon as possible.  Feel free to call the clinic should you have any questions or concerns. The clinic phone number is [phone].  Please show the CHEMO ALERT CARD at check-in to the Emergency Department and triage nurse.  Zoledronic Acid injection (Hypercalcemia, Oncology) What is this medicine? ZOLEDRONIC ACID (ZOE le dron ik AS id) lowers the amount of calcium loss from bone. It is used to treat too much calcium in your blood from cancer. It is also used to prevent complications of cancer that has spread to the bone. This medicine may be used for other purposes; ask your health care provider or pharmacist if you have questions. COMMON BRAND NAME(S): Zometa What should I tell my health care provider before I take this medicine? They need to know if you have any of these conditions:  aspirin-sensitive asthma  cancer, especially if you are receiving medicines used to treat cancer  dental disease or wear dentures  infection  kidney disease  receiving corticosteroids like dexamethasone or prednisone  an unusual or allergic reaction to zoledronic  acid, other medicines, foods, dyes, or preservatives  pregnant or trying to get pregnant  breast-feeding How should I use this medicine? This medicine is for infusion into a vein. It is given by a health care professional in a hospital or clinic setting. Talk to your pediatrician regarding the use of this medicine in children. Special care may be needed. Overdosage: If you think you have taken too much of this medicine contact a poison control center or emergency room at once. NOTE: This medicine is only for you. Do not share this medicine with others. What if I miss a dose? It is important not to miss your dose. Call your doctor or health care professional if you are unable to keep an appointment. What may interact with this medicine?  certain antibiotics given by injection  NSAIDs, medicines for pain and inflammation, like ibuprofen or naproxen  some diuretics like bumetanide, furosemide  teriparatide  thalidomide This list may not describe all possible interactions. Give your health care provider a list of all the medicines, herbs, non-prescription drugs, or dietary supplements you use. Also tell them if you smoke, drink alcohol, or use illegal drugs. Some items may interact with your medicine. What should I watch for while using this medicine? Visit your doctor or health care professional for regular checkups. It may be some time before you see the benefit from this medicine. Do not stop taking your medicine unless your doctor tells you to. Your doctor may order blood tests or other tests to see how you are doing. Women should inform their doctor if they wish to become pregnant or think they might be pregnant.   pregnant. There is a potential for serious side effects to an unborn child. Talk to your health care professional or pharmacist for more information. You should make sure that you get enough calcium and vitamin D while you are taking this medicine. Discuss the foods you eat and the  vitamins you take with your health care professional. Some people who take this medicine have severe bone, joint, and/or muscle pain. This medicine may also increase your risk for jaw problems or a broken thigh bone. Tell your doctor right away if you have severe pain in your jaw, bones, joints, or muscles. Tell your doctor if you have any pain that does not go away or that gets worse. Tell your dentist and dental surgeon that you are taking this medicine. You should not have major dental surgery while on this medicine. See your dentist to have a dental exam and fix any dental problems before starting this medicine. Take good care of your teeth while on this medicine. Make sure you see your dentist for regular follow-up appointments. What side effects may I notice from receiving this medicine? Side effects that you should report to your doctor or health care professional as soon as possible:  allergic reactions like skin rash, itching or hives, swelling of the face, lips, or tongue  anxiety, confusion, or depression  breathing problems  changes in vision  eye pain  feeling faint or lightheaded, falls  jaw pain, especially after dental work  mouth sores  muscle cramps, stiffness, or weakness  redness, blistering, peeling or loosening of the skin, including inside the mouth  trouble passing urine or change in the amount of urine Side effects that usually do not require medical attention (report to your doctor or health care professional if they continue or are bothersome):  bone, joint, or muscle pain  constipation  diarrhea  fever  hair loss  irritation at site where injected  loss of appetite  nausea, vomiting  stomach upset  trouble sleeping  trouble swallowing  weak or tired This list may not describe all possible side effects. Call your doctor for medical advice about side effects. You may report side effects to FDA at 1-800-FDA-1088. Where should I keep my  medicine? This drug is given in a hospital or clinic and will not be stored at home. NOTE: This sheet is a summary. It may not cover all possible information. If you have questions about this medicine, talk to your doctor, pharmacist, or health care provider.  2020 Elsevier/Gold Standard (2014-04-28 14:19:39)  Coronavirus (COVID-19) Are you at risk?  Are you at risk for the Coronavirus (COVID-19)?  To be considered HIGH RISK for Coronavirus (COVID-19), you have to meet the following criteria:  . Traveled to [place], [place], [place], [place] or [place]; or in the [place] to [employer], [place], [place], or [place]; and have fever, cough, and shortness of breath within the last 2 weeks of travel OR . Been in close contact with a person diagnosed with COVID-19 within the last 2 weeks and have fever, cough, and shortness of breath . IF YOU DO NOT MEET THESE CRITERIA, YOU ARE CONSIDERED LOW RISK FOR COVID-19.  What to do if you are HIGH RISK for COVID-19?  Marland Kitchen If you are having a medical emergency, call 911. . Seek medical care right away. Before you go to a doctor's office, urgent care or emergency department, call ahead and tell them about your recent travel, contact with someone diagnosed with COVID-19,  and your symptoms. You should receive instructions from your physician's office regarding next steps of care.  . When you arrive at healthcare provider, tell the healthcare staff immediately you have returned from visiting [place], [place], [place], [place] or [place]; or traveled in the [place] to [place], [place], [place], or [place]; in the last two weeks or you have been in close contact with a person diagnosed with COVID-19 in the last 2 weeks.   . Tell the health care staff about your symptoms: fever, cough and shortness of breath. . After you have been seen by a medical provider, you will be either: o Tested for (COVID-19) and discharged home on quarantine except to  seek medical care if symptoms worsen, and asked to  - Stay home and avoid contact with others until you get your results (4-5 days)  - Avoid travel on public transportation if possible (such as bus, train, or airplane) or o Sent to the Emergency Department by EMS for evaluation, COVID-19 testing, and possible admission depending on your condition and test results.  What to do if you are LOW RISK for COVID-19?  Reduce your risk of any infection by using the same precautions used for avoiding the common cold or flu:  Marland Kitchen Wash your hands often with soap and warm water for at least 20 seconds.  If soap and water are not readily available, use an alcohol-based hand sanitizer with at least 60% alcohol.  . If coughing or sneezing, cover your mouth and nose by coughing or sneezing into the elbow areas of your shirt or coat, into a tissue or into your sleeve (not your hands). . Avoid shaking hands with others and consider head nods or verbal greetings only. . Avoid touching your eyes, nose, or mouth with unwashed hands.  . Avoid close contact with people who are sick. . Avoid places or events with large numbers of people in one location, like concerts or sporting events. . Carefully consider travel plans you have or are making. . If you are planning any travel outside or inside the [name], visit the CDC's Travelers' Health webpage for the latest health notices. . If you have some symptoms but not all symptoms, continue to monitor at home and seek medical attention if your symptoms worsen. . If you are having a medical emergency, call 911.   [place] / e-Visit: [email or web address]         [facility]: [facility]: [id number]                   [facility]: [phone]

## 2019-12-18 NOTE — Progress Notes (Signed)
HEMATOLOGY/ONCOLOGY CLINIC NOTE  Date of Service: 12/18/2019  Patient Care Team: Hoyt Koch, MD as PCP - General (Internal Medicine) Lafayette Dragon, MD (Inactive) as Consulting Physician (Gastroenterology) Megan Salon, MD as Consulting Physician (Gynecology) Melrose Nakayama, MD as Consulting Physician (Orthopedic Surgery) Deneise Lever, MD as Consulting Physician (Pulmonary Disease) [name], MD (Ophthalmology)  CHIEF COMPLAINTS/PURPOSE OF CONSULTATION:  Continue mx of myeloma  HISTORY OF PRESENTING ILLNESS:   Faith Orr is a wonderful 79 y.o. female who has been referred to [name] by Dr. Pricilla Holm for evaluation and management of Lytic bone lesions. She is accompanied today by her son in law, and her partner is present via phone. The pt reports that she is doing well overall.   The pt notes that 2-3 months ago while standing at the stove, and otherwise feeling normally, she felt "something snap that took her breath away" in her mid back as she stretched to get something. The pt notes that she saw a chiropractor twice due to her back stiffness, which did not help. The pt then described her new bone pains to her PCP on 12/05/18, and subsequent imaging, as noted below, revealed concerns for numerous bone lesions. She has begun 43m Fosamax. The pt notes that she had hip pain in 2018, and that an XR at that time did not reveal any lesions.  The pt notes that most of her pain is concentrated to her left shoulder presently, and with minimal movement of the arm. She endorses pain radiating into her left arm and notes that her hand is swollen in the mornings when she wakes up. She also endorses present back pain and right hip pain, worse when she walks. She has not yet seen orthopedics. The pt reports that she is needing to take [id number] every 4-6 hours as Tramadol alone has not been able to alleviate her pain. The pt denies any unexpected weight loss, fevers,  chills, or night sweats. The pt notes that her urine is a very dark color presently, but denies overt blood in the urine, underpants, nor tissue paper. The pt notes that in the last two weeks she has had some soreness in her head, but denies new headaches or changes in vision. The pt notes that she has been urinating more frequently overall, but has been trying to stay better hydrated as well.  The pt notes that she has been compliant with annual mammograms.   The pt notes that she had a cyst in her right breast which was removed in the past. She fractured her left wrist in 2008 after falling down stairs. The pt endorses history of fatty liver.   The pt denies ever smoking cigarettes and endorses significant second hand smoke exposure with a previous marriage.   Of note prior to the patient's visit today, pt has had a Bone Scan completed on 12/13/18 with results revealing Multifocal uptake throughout the skeleton, consistent with diffuse metastatic disease. Primary tumor is not specified. 2. Uptake in the proximal right femur, consistent with lytic lesions. 3. Uptake in the ribs bilaterally as described. 4. Lesions in the proximal left humerus. 5. Diffuse uptake throughout the skull consistent with metastatic disease. 6. Right paramedian uptake at the manubrium.  Most recent lab results (12/08/18) of CBC w/diff and CMP is as follows: all values are WNL except for Glucose at 279, BUN at 24, AST at 41, ALT at 46. 12/08/18 SPEP revealed all values WNL except for Total  Protein at 6.0, Albumin at 3.6, Gamma globulin at 0.7, and M spike at 0.5g  On review of systems, pt reports significant left shoulder pain, back pain, right hip pain, dark urine, and denies fevers, chills, night sweats, unexpected weight loss, changes in bowel habits, changes in breathing, cough, new respiratory symptoms, changes in vision, abdominal pains, leg swelling, and any other symptoms.   On PMHx the pt reports fatty liver, and  denies blood clots.  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 Denies ever smoking.  On Family Hx the pt reports maternal grandmother with colon cancer. Father with bladder cancer and amyloidosis (pt notes that this could have been misdiagnosis). Mother with Protein S deficiency and polymyalgia rheumatica.  Current Treatment: cycle 11 day 15   Interval History:  Faith Orr returns today for management and evaluation of her Multiple Myeloma and C12D1 of maintenance Carfilzomib, Revlimid, and Dexamethasone. The patient's last visit with [name] was on 12/04/2019. The pt reports that she is doing well overall.  The pt reports that she had chills and nausea about 3-4 hours after her last treatment. Pt denies any accompanying fever. She felt "off" the following few days but nothing that was significantly bothersome.  She has noticed that her back pain appears to move about and is not localized to a specific area. She also reports occasional lose stools but denies any diarrhea.   Lab results today (12/18/19) of CBC w/diff and CMP is as follows: all values are WNL except for RBC at 3.85, Monocytes Abs at 1.2K,  Potassium at 3.1, Glucose at 185, Total Protein at 5.9, Albumin at 3.4. 12/18/2019 MMP is in progress 12/18/2019 K/L light chains is in progress  On review of systems, pt reports loose stools and denies fevers, sore throat, cough, diarrhea, abdominal pain, leg swelling and any other symptoms.   MEDICAL HISTORY:  Past Medical History:  Diagnosis Date  . Allergy    seasonal  . Asthma   . DEPRESSION   . DIABETES MELLITUS, TYPE II   . Diverticulosis   . HYPERLIPIDEMIA   . Macular degeneration of left eye    mild, Dr.Hecker  . Obesity, unspecified   . Osteoarthritis of both knees   . OSTEOPENIA   . Osteopenia   . URINARY INCONTINENCE     SURGICAL HISTORY: Past Surgical History:  Procedure Laterality Date  . CATARACT EXTRACTION Left  05/24/2018  . CESAREAN SECTION  01/1973  . CYSTOSCOPY/URETEROSCOPY/HOLMIUM LASER/STENT PLACEMENT Right 09/20/2019   Procedure: CYSTOSCOPY/URETEROSCOPY/HOLMIUM LASER/STENT PLACEMENT;  Surgeon: Lucas Mallow, MD;  Location: [facility];  Service: Urology;  Laterality: Right;  . FRACTURE SURGERY    . IR IMAGING GUIDED PORT INSERTION  02/20/2019  . left wrist surgery  2008   By Dr. Latanya Maudlin  . right ankle  1994    SOCIAL HISTORY: Social History   Socioeconomic History  . Marital status: Married    Spouse name: Not on file  . Number of children: 1  . Years of education: Not on file  . Highest education level: Not on file  Occupational History    Employer: [employer]  Tobacco Use  . Smoking status: Never Smoker  . Smokeless tobacco: Never Used  . Tobacco comment: Lives with partner [name]) and son  Substance and Sexual Activity  . Alcohol use: No    Alcohol/week: 0.0 standard drinks  . Drug use: No  . Sexual  activity: Never    Partners: Female    Birth control/protection: Post-menopausal    Comment: Lives with female partner (annette hicks) and 1 yo son  Other Topics Concern  . Not on file  Social History Narrative  . Not on file   Social Determinants of Health   Financial Resource Strain:   . Difficulty of Paying Living Expenses: Not on file  Food Insecurity:   . Worried About Charity fundraiser in the Last Year: Not on file  . Ran Out of Food in the Last Year: Not on file  Transportation Needs:   . Lack of Transportation (Medical): Not on file  . Lack of Transportation (Non-Medical): Not on file  Physical Activity:   . Days of Exercise per Week: Not on file  . Minutes of Exercise per Session: Not on file  Stress:   . Feeling of Stress : Not on file  Social Connections:   . Frequency of Communication with Friends and Family: Not on file  . Frequency of Social Gatherings with Friends and Family: Not on file  . Attends Religious  Services: Not on file  . Active Member of Clubs or Organizations: Not on file  . Attends Archivist Meetings: Not on file  . Marital Status: Not on file  Intimate Partner Violence:   . Fear of Current or Ex-Partner: Not on file  . Emotionally Abused: Not on file  . Physically Abused: Not on file  . Sexually Abused: Not on file    FAMILY HISTORY: Family History  Problem Relation Age of Onset  . Diabetes Father   . Hyperlipidemia Father   . Heart disease Father   . Cancer Father   . Hypertension Father   . Colon cancer Paternal Grandmother 2  . Osteoporosis Mother   . Protein S deficiency Mother   . Hyperlipidemia Mother   . Multiple sclerosis Daughter   . Cancer Other        bladder  . Breast cancer Neg Hx     ALLERGIES:  is allergic to penicillins; aleve [naproxen sodium]; and sulfonamide derivatives.  MEDICATIONS:  Current Outpatient Medications  Medication Sig Dispense Refill  . acyclovir (ZOVIRAX) 400 MG tablet TAKE 1 TABLET(400 MG) BY MOUTH TWICE DAILY 60 tablet 1  . aspirin EC 81 MG tablet Take 81 mg by mouth daily after breakfast.     . Blood Glucose Monitoring Suppl (FREESTYLE FREEDOM LITE) W/DEVICE KIT Use to check blood sugars twice a day Dx 250.00 1 each 0  . Calcium Carbonate-Vitamin D (CALCIUM 600+D HIGH POTENCY) 600-400 MG-UNIT per tablet Take 1 tablet by mouth 2 (two) times daily.     . Cetirizine HCl 10 MG CAPS Take 1 capsule (10 mg total) by mouth daily. 30 capsule 1  . dexamethasone (DECADRON) 4 MG tablet Take 5 tablets (20 mg total) by mouth once a week. On D22 of each cycle of treatment 20 tablet 5  . docusate sodium (DOK) 100 MG capsule Take 2 capsules (200 mg total) by mouth at bedtime. 60 capsule 1  . fentaNYL (DURAGESIC) 12 MCG/HR Place 1 patch onto the skin every 3 (three) days. 10 patch 0  . fluticasone (FLONASE) 50 MCG/ACT nasal spray Place 1 spray into both nostrils daily. (Patient taking differently: Place 1 spray into both nostrils  daily as needed for allergies or rhinitis. ) 16 g 2  . glipiZIDE (GLUCOTROL XL) 5 MG 24 hr tablet TAKE 1 TABLET(5 MG) BY MOUTH DAILY WITH BREAKFAST  90 tablet 1  . glucose blood (FREESTYLE LITE) test strip CHECK BLOOD SUGAR TWICE DAILY AS DIRECTED Dx 250.00 180 each 3  . Lancets (FREESTYLE) lancets Use twice daily to check sugars. 100 each 11  . lenalidomide (REVLIMID) 15 MG capsule TAKE 1 CAPSULE BY MOUTH  DAILY FOR 21 DAYS ON, THEN  7 DAYS OFF 21 capsule 0  . lidocaine-prilocaine (EMLA) cream APPLY 1 APPLICATION TO THE AFFECTED AREA AS NEEDED. USE PRIOR TO PORT ACCESS 30 g 0  . LORazepam (ATIVAN) 0.5 MG tablet Take 1 tablet (0.5 mg total) by mouth every 8 (eight) hours as needed for anxiety (significant essential tremors). 60 tablet 0  . metFORMIN (GLUCOPHAGE-XR) 500 MG 24 hr tablet TAKE 3 TABLETS(1500 MG) BY MOUTH DAILY WITH BREAKFAST (Patient taking differently: Take 1,000 mg by mouth daily. ) 270 tablet 1  . Multiple Vitamins-Minerals (ICAPS) CAPS Take 1 capsule by mouth daily after breakfast.     . ondansetron (ZOFRAN) 8 MG tablet Take 1 tablet (8 mg total) by mouth 2 (two) times daily as needed (Nausea or vomiting). 30 tablet 1  . Oxycodone HCl 10 MG TABS Take 1 tablet (10 mg total) by mouth every 6 (six) hours as needed. 90 tablet 0  . pantoprazole (PROTONIX) 20 MG tablet TAKE 1 TABLET(20 MG) BY MOUTH DAILY 30 tablet 5  . polyethylene glycol (MIRALAX / GLYCOLAX) packet Take 17 g by mouth daily after breakfast.     . prochlorperazine (COMPAZINE) 10 MG tablet Take 1 tablet (10 mg total) by mouth every 6 (six) hours as needed (Nausea or vomiting). 30 tablet 1  . sertraline (ZOLOFT) 50 MG tablet TAKE 1 TABLET BY MOUTH DAILY 90 tablet 0  . simvastatin (ZOCOR) 20 MG tablet TAKE 1 TABLET(20 MG) BY MOUTH DAILY 90 tablet 0  . Triamcinolone Acetonide 0.025 % LOTN Apply 1 application topically 3 (three) times daily as needed (rash/itching). 60 mL 2  . Vitamin D, Ergocalciferol, (DRISDOL) 1.25 MG (50000  UT) CAPS capsule TAKE 1 CAPSULE BY MOUTH EVERY 7 DAYS 12 capsule 0   No current facility-administered medications for this visit.   Facility-Administered Medications Ordered in Other Visits  Medication Dose Route Frequency Provider Last Rate Last Admin  . 0.9 %  sodium chloride infusion   Intravenous Once Brunetta Genera, MD      . acetaminophen (TYLENOL) tablet 650 mg  650 mg Oral Once Brunetta Genera, MD      . carfilzomib (KYPROLIS) 128 mg in dextrose 5 % 50 mL chemo infusion  70 mg/m2 (Treatment Plan Recorded) Intravenous Once Brunetta Genera, MD      . dexamethasone (DECADRON) tablet 20 mg  20 mg Oral Once Brunetta Genera, MD      . diphenhydrAMINE (BENADRYL) tablet 25 mg  25 mg Oral Once Brunetta Genera, MD      . famotidine (PEPCID) tablet 20 mg  20 mg Oral Once Brunetta Genera, MD      . heparin lock flush 100 unit/mL  500 Units Intracatheter Once PRN Brunetta Genera, MD      . heparin lock flush 100 unit/mL  500 Units Intracatheter Once PRN Brunetta Genera, MD      . prochlorperazine (COMPAZINE) tablet 10 mg  10 mg Oral Once Brunetta Genera, MD      . sodium chloride flush (NS) 0.9 % injection 10 mL  10 mL Intracatheter PRN Brunetta Genera, MD   10 mL at 10/02/19 1524  .  sodium chloride flush (NS) 0.9 % injection 10 mL  10 mL Intracatheter PRN Brunetta Genera, MD        REVIEW OF SYSTEMS:   A 10+ POINT REVIEW OF SYSTEMS WAS OBTAINED including neurology, dermatology, psychiatry, cardiac, respiratory, lymph, extremities, GI, GU, Musculoskeletal, constitutional, breasts, reproductive, HEENT.  All pertinent positives are noted in the HPI.  All others are negative.   PHYSICAL EXAMINATION: ECOG FS:2 - Symptomatic, <50% confined to bed  Vitals:   12/18/19 1229  BP: 133/73  Pulse: 83  Resp: 18  Temp: 98.2 F (36.8 C)  SpO2: 98%   Wt Readings from Last 3 Encounters:  12/18/19 169 lb 1.6 oz (76.7 kg)  12/04/19 170 lb (77.1 kg)    11/02/19 172 lb 11.2 oz (78.3 kg)   Body mass index is 34.15 kg/m.    Exam was given in a chair   GENERAL:alert, in no acute distress and comfortable SKIN: no acute rashes, no significant lesions EYES: conjunctiva are pink and non-injected, sclera anicteric OROPHARYNX: MMM, no exudates, no oropharyngeal erythema or ulceration NECK: supple, no JVD LYMPH:  no palpable lymphadenopathy in the cervical, axillary or inguinal regions LUNGS: clear to auscultation b/l with normal respiratory effort HEART: regular rate & rhythm ABDOMEN:  normoactive bowel sounds , non tender, not distended. No palpable hepatosplenomegaly.  Extremity: no pedal edema PSYCH: alert & oriented x 3 with fluent speech NEURO: no focal motor/sensory deficits  LABORATORY DATA:  I have reviewed the data as listed  . CBC Latest Ref Rng & Units 12/18/2019 12/04/2019 11/20/2019  WBC 4.0 - 10.5 K/uL 6.0 6.5 6.5  Hemoglobin 12.0 - 15.0 g/dL 12.6 12.5 12.8  Hematocrit 36.0 - 46.0 % 37.7 37.5 37.9  Platelets 150 - 400 K/uL 180 260 208   . CBC    Component Value Date/Time   WBC 6.0 12/18/2019 1127   RBC 3.85 (L) 12/18/2019 1127   HGB 12.6 12/18/2019 1127   HGB 12.8 02/16/2019 1138   HCT 37.7 12/18/2019 1127   PLT 180 12/18/2019 1127   PLT 170 02/16/2019 1138   MCV 97.9 12/18/2019 1127   MCH 32.7 12/18/2019 1127   MCHC 33.4 12/18/2019 1127   RDW 13.8 12/18/2019 1127   LYMPHSABS 1.2 12/18/2019 1127   MONOABS 1.2 (H) 12/18/2019 1127   EOSABS 0.4 12/18/2019 1127   BASOSABS 0.1 12/18/2019 1127    . CMP Latest Ref Rng & Units 12/18/2019 12/04/2019 11/20/2019  Glucose 70 - 99 mg/dL 185(H) 192(H) 210(H)  BUN 8 - 23 mg/dL '10 11 11  ' Creatinine 0.44 - 1.00 mg/dL 0.76 0.83 0.80  Sodium 135 - 145 mmol/L 139 138 139  Potassium 3.5 - 5.1 mmol/L 3.1(L) 3.5 3.6  Chloride 98 - 111 mmol/L 103 104 106  CO2 22 - 32 mmol/L '22 22 23  ' Calcium 8.9 - 10.3 mg/dL 9.4 9.1 9.2  Total Protein 6.5 - 8.1 g/dL 5.9(L) 6.3(L) 6.3(L)  Total  Bilirubin 0.3 - 1.2 mg/dL 1.1 0.6 0.7  Alkaline Phos 38 - 126 U/L 62 75 78  AST 15 - 41 U/L 23 14(L) 17  ALT 0 - 44 U/L '24 17 23   ' 09/18/2019 BM Bx Report ([id number])   09/18/2019 FISH Panel    05/30/2019 BM Bx   01/06/2019 BM Bx:     01/06/19 Cytogenetics:      05/30/19 BM Biopsy:   09/18/2019 FISH Panel    09/18/2019 BM Surgical Pathology ([id number])     RADIOGRAPHIC STUDIES: I have personally  reviewed the radiological images as listed and agreed with the findings in the report. No results found.  ASSESSMENT & PLAN:   79 y.o. female with  1. Recently diagnosed Multiple Myeloma, RISS Stage III  Labs upon initial presentation from 12/08/18, blood counts are normal including WBC at 7.1k, HGB at 13.1, and PLT at 245k. Calcium normal at 10.3. Creatinine normal at 0.63. M spike at 0.5g. 12/13/18 Bone Scan revealed Multifocal uptake throughout the skeleton, consistent with diffuse metastatic disease. Primary tumor is not specified. 2. Uptake in the proximal right femur, consistent with lytic lesions. 3. Uptake in the ribs bilaterally as described. 4. Lesions in the proximal left humerus. 5. Diffuse uptake throughout the skull consistent with metastatic disease. 6. Right paramedian uptake at the manubrium.  12/13/18 CT Right Femur revealed Numerous lytic lesions involving the right femur and a lytic lesion in the left inferior pubic ramus. Overall appearance is most concerning for multiple myeloma  12/27/18 Pretreatment 24hour UPEP observed an M spike at 43m, and showed 1938mtotal protein/day.  12/27/18 Pretreatment MMP revealed M Protein at 0.5g with IgG Lambda specificity. Kappa:Lambda light chain ratio at 0.13, with Lambda at 40.3. There is less abnormal protein and light chains than I would expect from 30% plasma cells, which suggests hypo-secretory or non-secretory neoplastic plasma cells. Will have an impact in assessing response. 01/05/19 PET/CT revealed  Innumerable lytic lesions in the skeleton compatible with myeloma. Most of the larger lesions are hypermetabolic, for example including a left proximal humeral shaft lesion with maximum SUV of 8.1 and a 2.8 cm lesion in the left T9 vertebral body with maximum SUV 5.1. Most of the smaller lytic lesions, and some of the larger lesions, do not demonstrate accentuated metabolic activity. 2. 1.2 cm in short axis lymph node in the left parapharyngeal space is hypermetabolic with maximum SUV 11.8. I do not see a separate mass in the head and neck to give rise to this hypermetabolic lymph node. 3. Mosaic attenuation in the lower lobes, nonspecific possibly from air trapping. 4.  Aortic Atherosclerosis 5. Heterogeneous activity in the liver, making it hard to exclude small liver lesions. Consider hepatic protocol MRI with and without contrast for definitive assessment. Nonobstructive right nephrolithiasis. Old granulomatous disease  01/06/19 Bone Marrow biopsy revealed interstitial increase in plasma cells (28% aspirate, 40% CD138 immunohistochemistry). Plasma cells negative for light chains consistent with a non or weakly secretory myeloma   01/06/19 Cytogenetics revealed 37% of cells with trisomy 11 or 11q deletion, and 40.5% of cells with 17p mutation  S/p 5 cycles of KRD treatment  05/31/19 BM Biopsy revealed mild atypical plasmacytosis at 5% with polytypic variation.   06/01/19 PET/CT revealed "Dominant lesion in the LEFT humerus is decreased significantly in metabolic activity. Additional hypermetabolic skeletal lytic lesions have decreased in metabolic activity or similar to comparison exam (01/05/2019). No evidence of disease progression. 2. Multiple additional lytic lesions do not have metabolic activity and unchanged. 3. No new skeletal lesions are identified. No soft tissue plasmacytoma identified. 4. Nodule / node in the LEFT parapharyngeal space which is intensely hypermetabolic not changed from prior. 5.  New hypermetabolic LEFT lower lobe pulmonary nodule is indeterminate. Recommend close attention on follow-up 6. New obstructive hydronephrosis of the RIGHT kidney related to RIGHT UPJ stone."  09/18/2019 BM Bx Report which revealed "Slightly hypercellular bone marrow for age with trilineage hematopoiesis and 1% plasma cells."  09/14/2019 PET/CT Whole Body Scan ([id number] revealed "1. There widespread tiny lytic lesions  compatible with multiple myeloma. Index larger lesions are generally similar to the prior exam, with low-grade activity such as the left T9 vertebral body lesion with maximum SUV 4.5. Is mild increase in the activity associated with a mildly sclerotic left proximal humeral lesion, maximum SUV 4.8 (previously 3.5). 2. At the site of the prior left lower lobe nodule is currently more bandlike thickening, with maximum SUV only 1.9, probably benign, continued surveillance of this region suggested. 3. There several small but hypermetabolic lymph nodes. This includes a left parapharyngeal space node measuring 1.0 cm with maximum SUV 12.3 (stable); a left level IB lymph node measuring 0.5 cm with maximum SUV 4.8 (slightly larger than prior); and a left inguinal lymph node measuring 0.7 cm in short axis with maximum SUV 6.4 (previously 0.5 cm with maximum SUV 0.6). Significance of these lymph nodes uncertain, surveillance is recommended. 4. New 5 mm left lower lobe subpleural nodule on image 32/8, not appreciably hypermetabolic, surveillance suggested. 5. Focal subcutaneous stranding along the left perineum measuring about 2.6 by 1.1 cm on image 221/4, maximum SUV 12.5. This was not present previously and is most likely inflammatory, although given the notable SUV, surveillance of this region is suggested. 6. Other imaging findings of potential clinical significance: Aortic Atherosclerosis (ICD10-I70.0). Coronary atherosclerosis. Old granulomatous disease. Mild right hydronephrosis due to a 7 mm  right UPJ calculus. 2 mm right kidney upper pole nonobstructive renal calculus. Prominent stool throughout the colon favors constipation."  2. 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  01/17/19 MRI Liver revealed Several appreciable liver lesions all have benign imaging characteristics. No MRI findings of metastatic involvement of the liver. 2. Scattered bony lesions corresponding to the lytic lesions seen at PET-CT, compatible with active myeloma. 3. Aortic Atherosclerosis.  Mild cardiomegaly. 4. Diffuse hepatic steatosis.   3. Left lower lobe pulmonary nodule First seen on 06/01/19 PET/CT  PLAN: -Discussed pt labwork today, 12/18/19; all values are WNL except for RBC at 3.85, Monocytes Abs at 1.2K,  Potassium at 3.1, Glucose at 185, Total Protein at 5.9, Albumin at 3.4. -Discussed 12/18/2019 MMP is in progress -Discussed 12/18/2019 K/L light chains is in progress -No lab or clinical evidence of Multiple Myeloma progression at this time -Recommend pt consider low-contact exercise due to ongoing pandemic -Recommend pt to take [id number] mg of Tylenol post-treatment to reduce symptoms of Carfilzomib related fever/chills. -The pt has not prohibitive toxicities from continuing C12D1 of 70 mg/m2 Carfilzomib at this time.  -Continue current dose of Revlimd -Will see back in 2 weeks  FOLLOW UP: Please schedule next 4 doses of maintenance Carfilzomib with labs and portflush. MD visit in 2 weeks   The total time spent in the appt was 30 minutes and more than 50% was on counseling and direct patient cares.  All of the patient's questions were answered with apparent satisfaction. The patient knows to call the clinic with any problems, questions or concerns.    Sullivan Lone MD [place] AAHIVMS [facility] Hematology/Oncology Physician [facility]  (Office):       [phone] (Work cell):   [phone] (Fax):           [phone]  12/18/2019 1:29 PM  I, Yevette Edwards, am acting as a scribe for Dr. Sullivan Lone.   .I have reviewed the above documentation for accuracy and completeness, and I agree with the above. Brunetta Genera MD

## 2019-12-18 NOTE — Patient Instructions (Signed)

## 2019-12-19 ENCOUNTER — Other Ambulatory Visit: Payer: Self-pay | Admitting: Hematology

## 2019-12-19 ENCOUNTER — Telehealth: Payer: Self-pay | Admitting: Hematology

## 2019-12-19 ENCOUNTER — Ambulatory Visit (HOSPITAL_COMMUNITY)
Admission: RE | Admit: 2019-12-19 | Discharge: 2019-12-19 | Disposition: A | Discharge status: Home / Self Care | Payer: Medicare PPO | Source: Ambulatory Visit | Attending: Hematology | Admitting: Hematology

## 2019-12-19 DIAGNOSIS — M47817 Spondylosis without myelopathy or radiculopathy, lumbosacral region: Secondary | ICD-10-CM | POA: Diagnosis not present

## 2019-12-19 DIAGNOSIS — C9000 Multiple myeloma not having achieved remission: Secondary | ICD-10-CM | POA: Insufficient documentation

## 2019-12-19 DIAGNOSIS — Z5111 Encounter for antineoplastic chemotherapy: Secondary | ICD-10-CM

## 2019-12-19 DIAGNOSIS — M546 Pain in thoracic spine: Secondary | ICD-10-CM | POA: Diagnosis not present

## 2019-12-19 DIAGNOSIS — M5127 Other intervertebral disc displacement, lumbosacral region: Secondary | ICD-10-CM | POA: Diagnosis not present

## 2019-12-19 DIAGNOSIS — M40294 Other kyphosis, thoracic region: Secondary | ICD-10-CM | POA: Diagnosis not present

## 2019-12-19 DIAGNOSIS — M5134 Other intervertebral disc degeneration, thoracic region: Secondary | ICD-10-CM | POA: Diagnosis not present

## 2019-12-19 DIAGNOSIS — M4317 Spondylolisthesis, lumbosacral region: Secondary | ICD-10-CM | POA: Diagnosis not present

## 2019-12-19 DIAGNOSIS — R937 Abnormal findings on diagnostic imaging of other parts of musculoskeletal system: Secondary | ICD-10-CM | POA: Diagnosis not present

## 2019-12-19 DIAGNOSIS — S24119A Complete lesion at unspecified level of thoracic spinal cord, initial encounter: Secondary | ICD-10-CM | POA: Diagnosis not present

## 2019-12-19 LAB — KAPPA/LAMBDA LIGHT CHAINS
Kappa free light chain: 14.7 mg/L (ref 3.3–19.4)
Kappa, lambda light chain ratio: 1.11 (ref 0.26–1.65)
Lambda free light chains: 13.2 mg/L (ref 5.7–26.3)

## 2019-12-19 MED ORDER — GADOBUTROL 1 MMOL/ML IV SOLN
7.5000 mL | Freq: Once | INTRAVENOUS | Status: AC | PRN
  Administered 2019-12-19: 7.5 mL via INTRAVENOUS

## 2019-12-19 NOTE — Telephone Encounter (Signed)
Scheduled per los. Called and left msg. Mailed printout  °

## 2019-12-20 LAB — MULTIPLE MYELOMA PANEL, SERUM
Albumin SerPl Elph-Mcnc: 3.3 g/dL (ref 2.9–4.4)
Albumin/Glob SerPl: 1.5 (ref 0.7–1.7)
Alpha 1: 0.2 g/dL (ref 0.0–0.4)
Alpha2 Glob SerPl Elph-Mcnc: 0.7 g/dL (ref 0.4–1.0)
B-Globulin SerPl Elph-Mcnc: 0.9 g/dL (ref 0.7–1.3)
Gamma Glob SerPl Elph-Mcnc: 0.5 g/dL (ref 0.4–1.8)
Globulin, Total: 2.3 g/dL (ref 2.2–3.9)
IgA: 142 mg/dL (ref 64–422)
IgG (Immunoglobin G), Serum: 545 mg/dL — ABNORMAL LOW (ref 586–1602)
IgM (Immunoglobulin M), Srm: 16 mg/dL — ABNORMAL LOW (ref 26–217)
Total Protein ELP: 5.6 g/dL — ABNORMAL LOW (ref 6.0–8.5)

## 2019-12-25 ENCOUNTER — Encounter: Payer: Self-pay | Admitting: Hematology

## 2019-12-28 ENCOUNTER — Encounter: Payer: Self-pay | Admitting: Internal Medicine

## 2019-12-28 ENCOUNTER — Encounter: Payer: Self-pay | Admitting: Hematology

## 2019-12-28 ENCOUNTER — Other Ambulatory Visit: Payer: Self-pay | Admitting: *Deleted

## 2019-12-28 DIAGNOSIS — C9000 Multiple myeloma not having achieved remission: Secondary | ICD-10-CM

## 2019-12-28 MED ORDER — LENALIDOMIDE 15 MG PO CAPS: ORAL_CAPSULE | ORAL | 0 refills | Status: DC

## 2019-12-28 NOTE — Telephone Encounter (Signed)
Contacted by [employer] No [employer] Auth# sent with Revlimid refill. CB# [phone] opt 5   [employer] auth# [id number], 12/28/2019 Auth # called to [place] [phone], opt 5. Gave info to [employer]

## 2020-01-01 ENCOUNTER — Inpatient Hospital Stay: Payer: Medicare PPO

## 2020-01-01 ENCOUNTER — Inpatient Hospital Stay: Payer: Medicare PPO | Admitting: Hematology

## 2020-01-01 ENCOUNTER — Other Ambulatory Visit: Payer: Self-pay

## 2020-01-01 ENCOUNTER — Telehealth: Payer: Self-pay | Admitting: Hematology

## 2020-01-01 VITALS — BP 133/66 | HR 85 | Temp 98.2°F | Resp 16 | Ht 59.0 in | Wt 168.3 lb

## 2020-01-01 DIAGNOSIS — Z7189 Other specified counseling: Secondary | ICD-10-CM

## 2020-01-01 DIAGNOSIS — C9000 Multiple myeloma not having achieved remission: Secondary | ICD-10-CM

## 2020-01-01 DIAGNOSIS — Z5111 Encounter for antineoplastic chemotherapy: Secondary | ICD-10-CM

## 2020-01-01 DIAGNOSIS — Z5112 Encounter for antineoplastic immunotherapy: Secondary | ICD-10-CM | POA: Diagnosis not present

## 2020-01-01 DIAGNOSIS — Z95828 Presence of other vascular implants and grafts: Secondary | ICD-10-CM

## 2020-01-01 LAB — CMP (CANCER CENTER ONLY)
ALT: 17 U/L (ref 0–44)
AST: 14 U/L — ABNORMAL LOW (ref 15–41)
Albumin: 3.5 g/dL (ref 3.5–5.0)
Alkaline Phosphatase: 75 U/L (ref 38–126)
Anion gap: 11 (ref 5–15)
BUN: 12 mg/dL (ref 8–23)
CO2: 23 mmol/L (ref 22–32)
Calcium: 9.1 mg/dL (ref 8.9–10.3)
Chloride: 104 mmol/L (ref 98–111)
Creatinine: 0.81 mg/dL (ref 0.44–1.00)
GFR, Est AFR Am: >60 mL/min (ref >60)
GFR, Estimated: >60 mL/min (ref >60)
Glucose, Bld: 189 mg/dL — ABNORMAL HIGH (ref 70–99)
Potassium: 3.8 mmol/L (ref 3.5–5.1)
Sodium: 138 mmol/L (ref 135–145)
Total Bilirubin: 0.5 mg/dL (ref 0.3–1.2)
Total Protein: 6.3 g/dL — ABNORMAL LOW (ref 6.5–8.1)

## 2020-01-01 LAB — CBC WITH DIFFERENTIAL/PLATELET
Abs Immature Granulocytes: 0.02 10*3/uL (ref 0.00–0.07)
Basophils Absolute: 0.1 10*3/uL (ref 0.0–0.1)
Basophils Relative: 2 %
Eosinophils Absolute: 0.2 10*3/uL (ref 0.0–0.5)
Eosinophils Relative: 3 %
HCT: 38.7 % (ref 36.0–46.0)
Hemoglobin: 13.2 g/dL (ref 12.0–15.0)
Immature Granulocytes: 0 %
Lymphocytes Relative: 19 %
Lymphs Abs: 1.1 10*3/uL (ref 0.7–4.0)
MCH: 33.4 pg (ref 26.0–34.0)
MCHC: 34.1 g/dL (ref 30.0–36.0)
MCV: 98 fL (ref 80.0–100.0)
Monocytes Absolute: 1.1 10*3/uL — ABNORMAL HIGH (ref 0.1–1.0)
Monocytes Relative: 19 %
Neutro Abs: 3.3 10*3/uL (ref 1.7–7.7)
Neutrophils Relative %: 57 %
Platelets: 254 10*3/uL (ref 150–400)
RBC: 3.95 MIL/uL (ref 3.87–5.11)
RDW: 13.6 % (ref 11.5–15.5)
WBC: 5.7 10*3/uL (ref 4.0–10.5)
nRBC: 0 % (ref 0.0–0.2)

## 2020-01-01 MED ORDER — SODIUM CHLORIDE 0.9 % IV SOLN
Freq: Once | INTRAVENOUS | Status: AC
  Filled 2020-01-01: qty 250

## 2020-01-01 MED ORDER — ACETAMINOPHEN 325 MG PO TABS
650.0000 mg | ORAL_TABLET | Freq: Once | ORAL | Status: AC
  Administered 2020-01-01: 650 mg via ORAL

## 2020-01-01 MED ORDER — SODIUM CHLORIDE 0.9 % IV SOLN
Freq: Once | INTRAVENOUS | Status: DC
  Filled 2020-01-01: qty 250

## 2020-01-01 MED ORDER — SODIUM CHLORIDE 0.9% FLUSH
10.0000 mL | INTRAVENOUS | Status: DC | PRN
  Administered 2020-01-01: 10 mL
  Filled 2020-01-01: qty 10

## 2020-01-01 MED ORDER — FAMOTIDINE 20 MG PO TABS
ORAL_TABLET | ORAL | Status: AC
  Filled 2020-01-01: qty 1

## 2020-01-01 MED ORDER — DEXAMETHASONE 4 MG PO TABS
ORAL_TABLET | ORAL | Status: AC
  Filled 2020-01-01: qty 5

## 2020-01-01 MED ORDER — DEXAMETHASONE 4 MG PO TABS
20.0000 mg | ORAL_TABLET | Freq: Once | ORAL | Status: AC
  Administered 2020-01-01: 20 mg via ORAL

## 2020-01-01 MED ORDER — DEXTROSE 5 % IV SOLN
71.0000 mg/m2 | Freq: Once | INTRAVENOUS | Status: AC
  Administered 2020-01-01: 15:00:00 130 mg via INTRAVENOUS
  Filled 2020-01-01: qty 60

## 2020-01-01 MED ORDER — FAMOTIDINE 20 MG PO TABS
20.0000 mg | ORAL_TABLET | Freq: Once | ORAL | Status: AC
  Administered 2020-01-01: 20 mg via ORAL

## 2020-01-01 MED ORDER — SODIUM CHLORIDE 0.9% FLUSH
10.0000 mL | Freq: Once | INTRAVENOUS | Status: AC
  Administered 2020-01-01: 10 mL
  Filled 2020-01-01: qty 10

## 2020-01-01 MED ORDER — DIPHENHYDRAMINE HCL 25 MG PO TABS
25.0000 mg | ORAL_TABLET | Freq: Once | ORAL | Status: AC
  Administered 2020-01-01: 25 mg via ORAL
  Filled 2020-01-01: qty 1

## 2020-01-01 MED ORDER — PROCHLORPERAZINE MALEATE 10 MG PO TABS
10.0000 mg | ORAL_TABLET | Freq: Once | ORAL | Status: AC
  Administered 2020-01-01: 14:00:00 10 mg via ORAL

## 2020-01-01 MED ORDER — ACETAMINOPHEN 325 MG PO TABS
ORAL_TABLET | ORAL | Status: AC
  Filled 2020-01-01: qty 2

## 2020-01-01 MED ORDER — PROCHLORPERAZINE MALEATE 10 MG PO TABS
ORAL_TABLET | ORAL | Status: AC
  Filled 2020-01-01: qty 1

## 2020-01-01 MED ORDER — HEPARIN SOD (PORK) LOCK FLUSH 100 UNIT/ML IV SOLN
500.0000 [IU] | Freq: Once | INTRAVENOUS | Status: AC | PRN
  Administered 2020-01-01: 500 [IU]
  Filled 2020-01-01: qty 5

## 2020-01-01 MED ORDER — DIPHENHYDRAMINE HCL 25 MG PO CAPS
ORAL_CAPSULE | ORAL | Status: AC
  Filled 2020-01-01: qty 1

## 2020-01-01 NOTE — Telephone Encounter (Signed)
Scheduled appt per 1/18 los

## 2020-01-01 NOTE — Progress Notes (Signed)
HEMATOLOGY/ONCOLOGY CLINIC NOTE  Date of Service: 01/01/2020  Patient Care Team: Hoyt Koch, MD as PCP - General (Internal Medicine) Lafayette Dragon, MD (Inactive) as Consulting Physician (Gastroenterology) Megan Salon, MD as Consulting Physician (Gynecology) Melrose Nakayama, MD as Consulting Physician (Orthopedic Surgery) Deneise Lever, MD as Consulting Physician (Pulmonary Disease) [name], MD (Ophthalmology)  CHIEF COMPLAINTS/PURPOSE OF CONSULTATION:  Continue mx of myeloma  HISTORY OF PRESENTING ILLNESS:   Faith Orr is a wonderful 79 y.o. female who has been referred to [name] by Dr. Pricilla Holm for evaluation and management of Lytic bone lesions. She is accompanied today by her son in law, and her partner is present via phone. The pt reports that she is doing well overall.   The pt notes that 2-3 months ago while standing at the stove, and otherwise feeling normally, she felt "something snap that took her breath away" in her mid back as she stretched to get something. The pt notes that she saw a chiropractor twice due to her back stiffness, which did not help. The pt then described her new bone pains to her PCP on 12/05/18, and subsequent imaging, as noted below, revealed concerns for numerous bone lesions. She has begun 31m Fosamax. The pt notes that she had hip pain in 2018, and that an XR at that time did not reveal any lesions.  The pt notes that most of her pain is concentrated to her left shoulder presently, and with minimal movement of the arm. She endorses pain radiating into her left arm and notes that her hand is swollen in the mornings when she wakes up. She also endorses present back pain and right hip pain, worse when she walks. She has not yet seen orthopedics. The pt reports that she is needing to take 6011mAdvil every 4-6 hours as Tramadol alone has not been able to alleviate her pain. The pt denies any unexpected weight loss, fevers,  chills, or night sweats. The pt notes that her urine is a very dark color presently, but denies overt blood in the urine, underpants, nor tissue paper. The pt notes that in the last two weeks she has had some soreness in her head, but denies new headaches or changes in vision. The pt notes that she has been urinating more frequently overall, but has been trying to stay better hydrated as well.  The pt notes that she has been compliant with annual mammograms.   The pt notes that she had a cyst in her right breast which was removed in the past. She fractured her left wrist in 2008 after falling down stairs. The pt endorses history of fatty liver.   The pt denies ever smoking cigarettes and endorses significant second hand smoke exposure with a previous marriage.   Of note prior to the patient's visit today, pt has had a Bone Scan completed on 12/13/18 with results revealing Multifocal uptake throughout the skeleton, consistent with diffuse metastatic disease. Primary tumor is not specified. 2. Uptake in the proximal right femur, consistent with lytic lesions. 3. Uptake in the ribs bilaterally as described. 4. Lesions in the proximal left humerus. 5. Diffuse uptake throughout the skull consistent with metastatic disease. 6. Right paramedian uptake at the manubrium.  Most recent lab results (12/08/18) of CBC w/diff and CMP is as follows: all values are WNL except for Glucose at 279, BUN at 24, AST at 41, ALT at 46. 12/08/18 SPEP revealed all values WNL except for Total  Protein at 6.0, Albumin at 3.6, Gamma globulin at 0.7, and M spike at 0.5g  On review of systems, pt reports significant left shoulder pain, back pain, right hip pain, dark urine, and denies fevers, chills, night sweats, unexpected weight loss, changes in bowel habits, changes in breathing, cough, new respiratory symptoms, changes in vision, abdominal pains, leg swelling, and any other symptoms.   On PMHx the pt reports fatty liver, and  denies blood clots.  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 Denies ever smoking.  On Family Hx the pt reports maternal grandmother with colon cancer. Father with bladder cancer and amyloidosis (pt notes that this could have been misdiagnosis). Mother with Protein S deficiency and polymyalgia rheumatica.  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 The patient's last visit with [name] was on 12/18/2019. The pt reports that she is doing well overall.  The pt reports that overall her back is feeling better. After her last treatment she took Tylenol as recommended. She still reports chills but notes that she did not have any nausea and her recovery time was shortened. Her pain is currently well controlled and is closer to discomfort. She is currently taking two Oxycodone every 12 hours. A few weeks ago she had a sharp pain that traveled down her left leg. She has not experienced this sensation since. Pt has not registered for the COVID19 vaccine yet.   Of note since the patient's last visit, pt has had Thoracic & Lumbar Spine MRI ([id number]) ([id number]) completed on 12/19/2019 with results revealing "Suspected myeloma lesions at T9 and S1. No compression deformity or epidural disease."  Lab results today (01/01/20) of CBC w/diff and CMP is as follows: all values are WNL except for Mono Abs at 1.1K, Glucose at 189, Total Protein at 6.3, AST at 14.  On review of systems, pt reports chills, back pain and denies fevers, night sweats, nausea, abdominal pain, leg swelling, constipation, diarrhea and any other symptoms.    MEDICAL HISTORY:  Past Medical History:  Diagnosis Date  . Allergy    seasonal  . Asthma   . DEPRESSION   . DIABETES MELLITUS, TYPE II   . Diverticulosis   .  HYPERLIPIDEMIA   . Macular degeneration of left eye    mild, Dr.Hecker  . Obesity, unspecified   . Osteoarthritis of both knees   . OSTEOPENIA   . Osteopenia   . URINARY INCONTINENCE     SURGICAL HISTORY: Past Surgical History:  Procedure Laterality Date  . CATARACT EXTRACTION Left 05/24/2018  . CESAREAN SECTION  01/1973  . CYSTOSCOPY/URETEROSCOPY/HOLMIUM LASER/STENT PLACEMENT Right 09/20/2019   Procedure: CYSTOSCOPY/URETEROSCOPY/HOLMIUM LASER/STENT PLACEMENT;  Surgeon: Lucas Mallow, MD;  Location: [facility];  Service: Urology;  Laterality: Right;  . FRACTURE SURGERY    . IR IMAGING GUIDED PORT INSERTION  02/20/2019  . left wrist surgery  2008   By Dr. Latanya Maudlin  . right ankle  1994    SOCIAL HISTORY: Social History   Socioeconomic History  . Marital status: Married    Spouse name: Not on file  . Number of children: 1  . Years of education: Not on file  . Highest education level: Not on file  Occupational History    Employer: [employer]  Tobacco Use  .  Smoking status: Never Smoker  . Smokeless tobacco: Never Used  . Tobacco comment: Lives with partner [name]) and son  Substance and Sexual Activity  . Alcohol use: No    Alcohol/week: 0.0 standard drinks  . Drug use: No  . Sexual activity: Never    Partners: Female    Birth control/protection: Post-menopausal    Comment: Lives with female partner (annette hicks) and 12 yo son  Other Topics Concern  . Not on file  Social History Narrative  . Not on file   Social Determinants of Health   Financial Resource Strain:   . Difficulty of Paying Living Expenses: Not on file  Food Insecurity:   . Worried About Charity fundraiser in the Last Year: Not on file  . Ran Out of Food in the Last Year: Not on file  Transportation Needs:   . Lack of Transportation (Medical): Not on file  . Lack of Transportation (Non-Medical): Not on file  Physical Activity:   . Days of Exercise per  Week: Not on file  . Minutes of Exercise per Session: Not on file  Stress:   . Feeling of Stress : Not on file  Social Connections:   . Frequency of Communication with Friends and Family: Not on file  . Frequency of Social Gatherings with Friends and Family: Not on file  . Attends Religious Services: Not on file  . Active Member of Clubs or Organizations: Not on file  . Attends Archivist Meetings: Not on file  . Marital Status: Not on file  Intimate Partner Violence:   . Fear of Current or Ex-Partner: Not on file  . Emotionally Abused: Not on file  . Physically Abused: Not on file  . Sexually Abused: Not on file    FAMILY HISTORY: Family History  Problem Relation Age of Onset  . Diabetes Father   . Hyperlipidemia Father   . Heart disease Father   . Cancer Father   . Hypertension Father   . Colon cancer Paternal Grandmother 75  . Osteoporosis Mother   . Protein S deficiency Mother   . Hyperlipidemia Mother   . Multiple sclerosis Daughter   . Cancer Other        bladder  . Breast cancer Neg Hx     ALLERGIES:  is allergic to penicillins; aleve [naproxen sodium]; and sulfonamide derivatives.  MEDICATIONS:  Current Outpatient Medications  Medication Sig Dispense Refill  . acyclovir (ZOVIRAX) 400 MG tablet TAKE 1 TABLET(400 MG) BY MOUTH TWICE DAILY 60 tablet 1  . aspirin EC 81 MG tablet Take 81 mg by mouth daily after breakfast.     . Blood Glucose Monitoring Suppl (FREESTYLE FREEDOM LITE) W/DEVICE KIT Use to check blood sugars twice a day Dx 250.00 1 each 0  . Calcium Carbonate-Vitamin D (CALCIUM 600+D HIGH POTENCY) 600-400 MG-UNIT per tablet Take 1 tablet by mouth 2 (two) times daily.     . Cetirizine HCl 10 MG CAPS Take 1 capsule (10 mg total) by mouth daily. 30 capsule 1  . dexamethasone (DECADRON) 4 MG tablet Take 5 tablets (20 mg total) by mouth once a week. On D22 of each cycle of treatment 20 tablet 5  . docusate sodium (DOK) 100 MG capsule Take 2 capsules  (200 mg total) by mouth at bedtime. 60 capsule 1  . fentaNYL (DURAGESIC) 12 MCG/HR Place 1 patch onto the skin every 3 (three) days. 10 patch 0  . fluticasone (FLONASE) 50 MCG/ACT nasal spray  Place 1 spray into both nostrils daily. (Patient taking differently: Place 1 spray into both nostrils daily as needed for allergies or rhinitis. ) 16 g 2  . glipiZIDE (GLUCOTROL XL) 5 MG 24 hr tablet TAKE 1 TABLET(5 MG) BY MOUTH DAILY WITH BREAKFAST 90 tablet 1  . glucose blood (FREESTYLE LITE) test strip CHECK BLOOD SUGAR TWICE DAILY AS DIRECTED Dx 250.00 180 each 3  . Lancets (FREESTYLE) lancets Use twice daily to check sugars. 100 each 11  . lenalidomide (REVLIMID) 15 MG capsule TAKE 1 CAPSULE BY MOUTH  DAILY FOR 21 DAYS ON, THEN  7 DAYS OFF 21 capsule 0  . lidocaine-prilocaine (EMLA) cream APPLY 1 APPLICATION TO THE AFFECTED AREA AS NEEDED. USE PRIOR TO PORT ACCESS 30 g 0  . LORazepam (ATIVAN) 0.5 MG tablet Take 1 tablet (0.5 mg total) by mouth every 8 (eight) hours as needed for anxiety (significant essential tremors). 60 tablet 0  . metFORMIN (GLUCOPHAGE-XR) 500 MG 24 hr tablet TAKE 3 TABLETS(1500 MG) BY MOUTH DAILY WITH BREAKFAST (Patient taking differently: Take 1,000 mg by mouth daily. ) 270 tablet 1  . Multiple Vitamins-Minerals (ICAPS) CAPS Take 1 capsule by mouth daily after breakfast.     . ondansetron (ZOFRAN) 8 MG tablet Take 1 tablet (8 mg total) by mouth 2 (two) times daily as needed (Nausea or vomiting). 30 tablet 1  . Oxycodone HCl 10 MG TABS Take 1 tablet (10 mg total) by mouth every 6 (six) hours as needed. 90 tablet 0  . pantoprazole (PROTONIX) 20 MG tablet TAKE 1 TABLET(20 MG) BY MOUTH DAILY 30 tablet 5  . polyethylene glycol (MIRALAX / GLYCOLAX) packet Take 17 g by mouth daily after breakfast.     . prochlorperazine (COMPAZINE) 10 MG tablet Take 1 tablet (10 mg total) by mouth every 6 (six) hours as needed (Nausea or vomiting). 30 tablet 1  . sertraline (ZOLOFT) 50 MG tablet TAKE 1  TABLET BY MOUTH DAILY 90 tablet 0  . simvastatin (ZOCOR) 20 MG tablet TAKE 1 TABLET(20 MG) BY MOUTH DAILY 90 tablet 0  . Triamcinolone Acetonide 0.025 % LOTN Apply 1 application topically 3 (three) times daily as needed (rash/itching). 60 mL 2  . Vitamin D, Ergocalciferol, (DRISDOL) 1.25 MG (50000 UT) CAPS capsule TAKE 1 CAPSULE BY MOUTH EVERY 7 DAYS 12 capsule 0   No current facility-administered medications for this visit.   Facility-Administered Medications Ordered in Other Visits  Medication Dose Route Frequency Provider Last Rate Last Admin  . heparin lock flush 100 unit/mL  500 Units Intracatheter Once PRN Brunetta Genera, MD      . sodium chloride flush (NS) 0.9 % injection 10 mL  10 mL Intracatheter PRN Brunetta Genera, MD   10 mL at 10/02/19 1524    REVIEW OF SYSTEMS:   A 10+ POINT REVIEW OF SYSTEMS WAS OBTAINED including neurology, dermatology, psychiatry, cardiac, respiratory, lymph, extremities, GI, GU, Musculoskeletal, constitutional, breasts, reproductive, HEENT.  All pertinent positives are noted in the HPI.  All others are negative.   PHYSICAL EXAMINATION: ECOG FS:2 - Symptomatic, <50% confined to bed  Vitals:   01/01/20 1326  BP: 133/66  Pulse: 85  Resp: 16  Temp: 98.2 F (36.8 C)  SpO2: 99%   Wt Readings from Last 3 Encounters:  01/01/20 168 lb 4.8 oz (76.3 kg)  12/18/19 169 lb 1.6 oz (76.7 kg)  12/04/19 170 lb (77.1 kg)   Body mass index is 33.99 kg/m.    Exam was given  in a chair   GENERAL:alert, in no acute distress and comfortable SKIN: no acute rashes, no significant lesions EYES: conjunctiva are pink and non-injected, sclera anicteric OROPHARYNX: MMM, no exudates, no oropharyngeal erythema or ulceration NECK: supple, no JVD LYMPH:  no palpable lymphadenopathy in the cervical, axillary or inguinal regions LUNGS: clear to auscultation b/l with normal respiratory effort HEART: regular rate & rhythm ABDOMEN:  normoactive bowel sounds , non  tender, not distended. No palpable hepatosplenomegaly.  Extremity: no pedal edema PSYCH: alert & oriented x 3 with fluent speech NEURO: no focal motor/sensory deficits  LABORATORY DATA:  I have reviewed the data as listed  . CBC Latest Ref Rng & Units 01/01/2020 12/18/2019 12/04/2019  WBC 4.0 - 10.5 K/uL 5.7 6.0 6.5  Hemoglobin 12.0 - 15.0 g/dL 13.2 12.6 12.5  Hematocrit 36.0 - 46.0 % 38.7 37.7 37.5  Platelets 150 - 400 K/uL 254 180 260   . CBC    Component Value Date/Time   WBC 5.7 01/01/2020 1251   RBC 3.95 01/01/2020 1251   HGB 13.2 01/01/2020 1251   HGB 12.8 02/16/2019 1138   HCT 38.7 01/01/2020 1251   PLT 254 01/01/2020 1251   PLT 170 02/16/2019 1138   MCV 98.0 01/01/2020 1251   MCH 33.4 01/01/2020 1251   MCHC 34.1 01/01/2020 1251   RDW 13.6 01/01/2020 1251   LYMPHSABS 1.1 01/01/2020 1251   MONOABS 1.1 (H) 01/01/2020 1251   EOSABS 0.2 01/01/2020 1251   BASOSABS 0.1 01/01/2020 1251    . CMP Latest Ref Rng & Units 01/01/2020 12/18/2019 12/04/2019  Glucose 70 - 99 mg/dL 189(H) 185(H) 192(H)  BUN 8 - 23 mg/dL _0 Creatinine 0.44 - 1.00 mg/dL 0.81 0.76 0.83  Sodium 135 - 145 mmol/L 138 139 138  Potassium 3.5 - 5.1 mmol/L 3.8 3.1(L) 3.5  Chloride 98 - 111 mmol/L 104 103 104  CO2 22 - 32 mmol/L _1 Calcium 8.9 - 10.3 mg/dL 9.1 9.4 9.1  Total Protein 6.5 - 8.1 g/dL 6.3(L) 5.9(L) 6.3(L)  Total Bilirubin 0.3 - 1.2 mg/dL 0.5 1.1 0.6  Alkaline Phos 38 - 126 U/L 75 62 75  AST 15 - 41 U/L 14(L) 23 14(L)  ALT 0 - 44 U/L _2 09/18/2019 BM Bx Report ([id number])   09/18/2019 FISH Panel    05/30/2019 BM Bx   01/06/2019 BM Bx:     01/06/19 Cytogenetics:      05/30/19 BM Biopsy:   09/18/2019 FISH Panel    09/18/2019 BM Surgical Pathology ([id number])     RADIOGRAPHIC STUDIES: I have personally reviewed the radiological images as listed and agreed with the findings in the report. MR THORACIC SPINE W WO CONTRAST  Result Date:  12/19/2019 CLINICAL DATA:  Multiple myeloma, worsening back pain EXAM: MRI THORACIC AND LUMBAR SPINE WITHOUT AND WITH CONTRAST TECHNIQUE: Multiplanar and multiecho pulse sequences of the thoracic and lumbar spine were obtained without and with intravenous contrast. CONTRAST:  7.34m GADAVIST GADOBUTROL 1 MMOL/ML IV SOLN COMPARISON:  None. FINDINGS: MRI THORACIC SPINE FINDINGS Alignment: Thoracic kyphosis is preserved. Anteroposterior alignment is maintained. Vertebrae: There is no acute compression deformity. There are a few scattered focal osseous lesions demonstrating intrinsic T1 hyperintensity likely reflecting vertebral body hemangiomas, for example at T5 and T10. A mildly STIR hyperintense and enhancing lesion is present within the T9 vertebral body. Marrow signal is otherwise mildly heterogeneous. Cord:  Normal caliber and signal Paraspinal and other soft tissues: Unremarkable.  Disc levels: Mild multilevel degenerative disc disease and facet hypertrophy. For example, there is a small left paracentral protrusion at T7-T8. There is no significant degenerative canal or neural foraminal stenosis. MRI LUMBAR SPINE FINDINGS Segmentation:  Standard. Alignment: There is minor grade 1 anterolisthesis at L4-L5 and retrolisthesis at L5-S1. Vertebrae: There is STIR hyperintensity and enhancement of the S1 spinous process. Marrow signal is otherwise mildly heterogeneous. Conus medullaris: Extends to the L1 level and appears normal. No abnormal intrathecal enhancement. Paraspinal and other soft tissues: Unremarkable. Disc levels: L1-L2:  No canal or foraminal stenosis per L2-L3:  No canal or foraminal stenosis. L3-L4:  Facet arthropathy.  No canal or foraminal stenosis. L4-L5: Anterolisthesis with uncovering of disc bulge. Marked facet arthropathy with ligamentum flavum infolding. Mild to moderate canal stenosis with narrowing of the lateral recesses. Moderate right and mild left foraminal stenosis. L5-S1: Disc bulge with  endplate osteophytic ridging. Moderate facet arthropathy with ligamentum flavum infolding. Minor canal stenosis with narrowing of the lateral recesses. Mild to moderate foraminal stenosis. Disc abuts the exiting right L5 nerve root. IMPRESSION: Suspected myeloma lesions at T9 and S1. No compression deformity or epidural disease. Electronically Signed   By: Macy Mis M.D.   On: 12/19/2019 16:50   MR Lumbar Spine W Wo Contrast  Result Date: 12/19/2019 CLINICAL DATA:  Multiple myeloma, worsening back pain EXAM: MRI THORACIC AND LUMBAR SPINE WITHOUT AND WITH CONTRAST TECHNIQUE: Multiplanar and multiecho pulse sequences of the thoracic and lumbar spine were obtained without and with intravenous contrast. CONTRAST:  7.4m GADAVIST GADOBUTROL 1 MMOL/ML IV SOLN COMPARISON:  None. FINDINGS: MRI THORACIC SPINE FINDINGS Alignment: Thoracic kyphosis is preserved. Anteroposterior alignment is maintained. Vertebrae: There is no acute compression deformity. There are a few scattered focal osseous lesions demonstrating intrinsic T1 hyperintensity likely reflecting vertebral body hemangiomas, for example at T5 and T10. A mildly STIR hyperintense and enhancing lesion is present within the T9 vertebral body. Marrow signal is otherwise mildly heterogeneous. Cord:  Normal caliber and signal Paraspinal and other soft tissues: Unremarkable. Disc levels: Mild multilevel degenerative disc disease and facet hypertrophy. For example, there is a small left paracentral protrusion at T7-T8. There is no significant degenerative canal or neural foraminal stenosis. MRI LUMBAR SPINE FINDINGS Segmentation:  Standard. Alignment: There is minor grade 1 anterolisthesis at L4-L5 and retrolisthesis at L5-S1. Vertebrae: There is STIR hyperintensity and enhancement of the S1 spinous process. Marrow signal is otherwise mildly heterogeneous. Conus medullaris: Extends to the L1 level and appears normal. No abnormal intrathecal enhancement. Paraspinal  and other soft tissues: Unremarkable. Disc levels: L1-L2:  No canal or foraminal stenosis per L2-L3:  No canal or foraminal stenosis. L3-L4:  Facet arthropathy.  No canal or foraminal stenosis. L4-L5: Anterolisthesis with uncovering of disc bulge. Marked facet arthropathy with ligamentum flavum infolding. Mild to moderate canal stenosis with narrowing of the lateral recesses. Moderate right and mild left foraminal stenosis. L5-S1: Disc bulge with endplate osteophytic ridging. Moderate facet arthropathy with ligamentum flavum infolding. Minor canal stenosis with narrowing of the lateral recesses. Mild to moderate foraminal stenosis. Disc abuts the exiting right L5 nerve root. IMPRESSION: Suspected myeloma lesions at T9 and S1. No compression deformity or epidural disease. Electronically Signed   By: PMacy MisM.D.   On: 12/19/2019 16:50    ASSESSMENT & PLAN:   79y.o. female with  1. Recently diagnosed Multiple Myeloma, RISS Stage III  Labs upon initial presentation from 12/08/18, blood counts are normal including WBC at 7.1k, HGB  at 13.1, and PLT at 245k. Calcium normal at 10.3. Creatinine normal at 0.63. M spike at 0.5g. 12/13/18 Bone Scan revealed Multifocal uptake throughout the skeleton, consistent with diffuse metastatic disease. Primary tumor is not specified. 2. Uptake in the proximal right femur, consistent with lytic lesions. 3. Uptake in the ribs bilaterally as described. 4. Lesions in the proximal left humerus. 5. Diffuse uptake throughout the skull consistent with metastatic disease. 6. Right paramedian uptake at the manubrium.  12/13/18 CT Right Femur revealed Numerous lytic lesions involving the right femur and a lytic lesion in the left inferior pubic ramus. Overall appearance is most concerning for multiple myeloma  12/27/18 Pretreatment 24hour UPEP observed an M spike at '18mg'$ , and showed '199mg'$  total protein/day.  12/27/18 Pretreatment MMP revealed M Protein at 0.5g with IgG Lambda  specificity. Kappa:Lambda light chain ratio at 0.13, with Lambda at 40.3. There is less abnormal protein and light chains than I would expect from 30% plasma cells, which suggests hypo-secretory or non-secretory neoplastic plasma cells. Will have an impact in assessing response. 01/05/19 PET/CT revealed Innumerable lytic lesions in the skeleton compatible with myeloma. Most of the larger lesions are hypermetabolic, for example including a left proximal humeral shaft lesion with maximum SUV of 8.1 and a 2.8 cm lesion in the left T9 vertebral body with maximum SUV 5.1. Most of the smaller lytic lesions, and some of the larger lesions, do not demonstrate accentuated metabolic activity. 2. 1.2 cm in short axis lymph node in the left parapharyngeal space is hypermetabolic with maximum SUV 11.8. I do not see a separate mass in the head and neck to give rise to this hypermetabolic lymph node. 3. Mosaic attenuation in the lower lobes, nonspecific possibly from air trapping. 4.  Aortic Atherosclerosis 5. Heterogeneous activity in the liver, making it hard to exclude small liver lesions. Consider hepatic protocol MRI with and without contrast for definitive assessment. Nonobstructive right nephrolithiasis. Old granulomatous disease  01/06/19 Bone Marrow biopsy revealed interstitial increase in plasma cells (28% aspirate, 40% CD138 immunohistochemistry). Plasma cells negative for light chains consistent with a non or weakly secretory myeloma   01/06/19 Cytogenetics revealed 37% of cells with trisomy 11 or 11q deletion, and 40.5% of cells with 17p mutation  S/p 5 cycles of KRD treatment  05/31/19 BM Biopsy revealed mild atypical plasmacytosis at 5% with polytypic variation.   06/01/19 PET/CT revealed "Dominant lesion in the LEFT humerus is decreased significantly in metabolic activity. Additional hypermetabolic skeletal lytic lesions have decreased in metabolic activity or similar to comparison exam (01/05/2019). No  evidence of disease progression. 2. Multiple additional lytic lesions do not have metabolic activity and unchanged. 3. No new skeletal lesions are identified. No soft tissue plasmacytoma identified. 4. Nodule / node in the LEFT parapharyngeal space which is intensely hypermetabolic not changed from prior. 5. New hypermetabolic LEFT lower lobe pulmonary nodule is indeterminate. Recommend close attention on follow-up 6. New obstructive hydronephrosis of the RIGHT kidney related to RIGHT UPJ stone."  09/18/2019 BM Bx Report which revealed "Slightly hypercellular bone marrow for age with trilineage hematopoiesis and 1% plasma cells."  09/14/2019 PET/CT Whole Body Scan ([id number]) which revealed "1. There widespread tiny lytic lesions compatible with multiple myeloma. Index larger lesions are generally similar to the prior exam, with low-grade activity such as the left T9 vertebral body lesion with maximum SUV 4.5. Is mild increase in the activity associated with a mildly sclerotic left proximal humeral lesion, maximum SUV 4.8 (previously 3.5). 2. At the  site of the prior left lower lobe nodule is currently more bandlike thickening, with maximum SUV only 1.9, probably benign, continued surveillance of this region suggested. 3. There several small but hypermetabolic lymph nodes. This includes a left parapharyngeal space node measuring 1.0 cm with maximum SUV 12.3 (stable); a left level IB lymph node measuring 0.5 cm with maximum SUV 4.8 (slightly larger than prior); and a left inguinal lymph node measuring 0.7 cm in short axis with maximum SUV 6.4 (previously 0.5 cm with maximum SUV 0.6). Significance of these lymph nodes uncertain, surveillance is recommended. 4. New 5 mm left lower lobe subpleural nodule on image 32/8, not appreciably hypermetabolic, surveillance suggested. 5. Focal subcutaneous stranding along the left perineum measuring about 2.6 by 1.1 cm on image 221/4, maximum SUV 12.5. This was not present  previously and is most likely inflammatory, although given the notable SUV, surveillance of this region is suggested. 6. Other imaging findings of potential clinical significance: Aortic Atherosclerosis (ICD10-I70.0). Coronary atherosclerosis. Old granulomatous disease. Mild right hydronephrosis due to a 7 mm right UPJ calculus. 2 mm right kidney upper pole nonobstructive renal calculus. Prominent stool throughout the colon favors constipation."  2. 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  01/17/19 MRI Liver revealed Several appreciable liver lesions all have benign imaging characteristics. No MRI findings of metastatic involvement of the liver. 2. Scattered bony lesions corresponding to the lytic lesions seen at PET-CT, compatible with active myeloma. 3. Aortic Atherosclerosis.  Mild cardiomegaly. 4. Diffuse hepatic steatosis.   3. Left lower lobe pulmonary nodule First seen on 06/01/19 PET/CT  PLAN: -Discussed pt labwork today, 01/01/20; all values are WNL except for Mono Abs at 1.1K, Glucose at 189, Total Protein at 6.3, AST at 14. -Discussed 12/18/2019 MMP shows no M Spike -Discussed 12/18/2019 K/L light chains shows all values WNL  -Discussed 12/19/2019 Thoracic & Lumbar Spine MRI ([id number]) ([id number])  "Suspected myeloma lesions at T9 and S1. No compression deformity or epidural disease." -T9 lesion was previously observed on 09/14/19 PET/CT -Due to severe arthritis and pinched nerves in the back, hard to determine if bone lesions are the source of back pain -Recommend pt continue to take [phone] mg of Tylenol post-treatment to reduce symptoms of Carfilzomib related fever/chills. -The pt has not prohibitive toxicities from continuing C12D15 of 70 mg/m2 Carfilzomib at this time. -Continue current dose of Revlimd -Recommended pt receive COVID19 vaccine when available -Will get a rpt PET/CT in 3  weeks  -Will see back in 4 weeks    FOLLOW UP: Please schedule next 4 doses of maintenance Carfilzomib with labs and portflush. MD visit in 4 weeks PET/CT in 3 weeks   The total time spent in the appt was 30 minutes and more than 50% was on counseling and direct patient cares.  All of the patient's questions were answered with apparent satisfaction. The patient knows to call the clinic with any problems, questions or concerns.    Sullivan Lone MD [place] [facility] Hematology/Oncology Physician [facility]  (Office):       [phone] (Work cell):  [phone] (Fax):           [phone]  01/01/2020 2:04 PM  I, Yevette Edwards, am acting as a scribe for Dr. Sullivan Lone.   .I have reviewed the above documentation for accuracy and completeness, and I agree with the above. Brunetta Genera MD

## 2020-01-01 NOTE — Patient Instructions (Signed)
[facility] Discharge Instructions for Patients Receiving Chemotherapy  Today you received the following chemotherapy agents Carfilzomib (KYPROLIS).  To help prevent nausea and vomiting after your treatment, we encourage you to take your nausea medication as prescribed.  If you develop nausea and vomiting that is not controlled by your nausea medication, call the clinic.   BELOW ARE SYMPTOMS THAT SHOULD BE REPORTED IMMEDIATELY:  *FEVER GREATER THAN 100.5 F  *CHILLS WITH OR WITHOUT FEVER  NAUSEA AND VOMITING THAT IS NOT CONTROLLED WITH YOUR NAUSEA MEDICATION  *UNUSUAL SHORTNESS OF BREATH  *UNUSUAL BRUISING OR BLEEDING  TENDERNESS IN MOUTH AND THROAT WITH OR WITHOUT PRESENCE OF ULCERS  *URINARY PROBLEMS  *BOWEL PROBLEMS  UNUSUAL RASH Items with * indicate a potential emergency and should be followed up as soon as possible.  Feel free to call the clinic should you have any questions or concerns. The clinic phone number is [phone].  Please show the CHEMO ALERT CARD at check-in to the Emergency Department and triage nurse.  Zoledronic Acid injection (Hypercalcemia, Oncology) What is this medicine? ZOLEDRONIC ACID (ZOE le dron ik AS id) lowers the amount of calcium loss from bone. It is used to treat too much calcium in your blood from cancer. It is also used to prevent complications of cancer that has spread to the bone. This medicine may be used for other purposes; ask your health care provider or pharmacist if you have questions. COMMON BRAND NAME(S): Zometa What should I tell my health care provider before I take this medicine? They need to know if you have any of these conditions:  aspirin-sensitive asthma  cancer, especially if you are receiving medicines used to treat cancer  dental disease or wear dentures  infection  kidney disease  receiving corticosteroids like dexamethasone or prednisone  an unusual or allergic reaction to zoledronic  acid, other medicines, foods, dyes, or preservatives  pregnant or trying to get pregnant  breast-feeding How should I use this medicine? This medicine is for infusion into a vein. It is given by a health care professional in a hospital or clinic setting. Talk to your pediatrician regarding the use of this medicine in children. Special care may be needed. Overdosage: If you think you have taken too much of this medicine contact a poison control center or emergency room at once. NOTE: This medicine is only for you. Do not share this medicine with others. What if I miss a dose? It is important not to miss your dose. Call your doctor or health care professional if you are unable to keep an appointment. What may interact with this medicine?  certain antibiotics given by injection  NSAIDs, medicines for pain and inflammation, like ibuprofen or naproxen  some diuretics like bumetanide, furosemide  teriparatide  thalidomide This list may not describe all possible interactions. Give your health care provider a list of all the medicines, herbs, non-prescription drugs, or dietary supplements you use. Also tell them if you smoke, drink alcohol, or use illegal drugs. Some items may interact with your medicine. What should I watch for while using this medicine? Visit your doctor or health care professional for regular checkups. It may be some time before you see the benefit from this medicine. Do not stop taking your medicine unless your doctor tells you to. Your doctor may order blood tests or other tests to see how you are doing. Women should inform their doctor if they wish to become pregnant or think they might be pregnant.   pregnant. There is a potential for serious side effects to an unborn child. Talk to your health care professional or pharmacist for more information. You should make sure that you get enough calcium and vitamin D while you are taking this medicine. Discuss the foods you eat and the  vitamins you take with your health care professional. Some people who take this medicine have severe bone, joint, and/or muscle pain. This medicine may also increase your risk for jaw problems or a broken thigh bone. Tell your doctor right away if you have severe pain in your jaw, bones, joints, or muscles. Tell your doctor if you have any pain that does not go away or that gets worse. Tell your dentist and dental surgeon that you are taking this medicine. You should not have major dental surgery while on this medicine. See your dentist to have a dental exam and fix any dental problems before starting this medicine. Take good care of your teeth while on this medicine. Make sure you see your dentist for regular follow-up appointments. What side effects may I notice from receiving this medicine? Side effects that you should report to your doctor or health care professional as soon as possible:  allergic reactions like skin rash, itching or hives, swelling of the face, lips, or tongue  anxiety, confusion, or depression  breathing problems  changes in vision  eye pain  feeling faint or lightheaded, falls  jaw pain, especially after dental work  mouth sores  muscle cramps, stiffness, or weakness  redness, blistering, peeling or loosening of the skin, including inside the mouth  trouble passing urine or change in the amount of urine Side effects that usually do not require medical attention (report to your doctor or health care professional if they continue or are bothersome):  bone, joint, or muscle pain  constipation  diarrhea  fever  hair loss  irritation at site where injected  loss of appetite  nausea, vomiting  stomach upset  trouble sleeping  trouble swallowing  weak or tired This list may not describe all possible side effects. Call your doctor for medical advice about side effects. You may report side effects to FDA at 1-800-FDA-1088. Where should I keep my  medicine? This drug is given in a hospital or clinic and will not be stored at home. NOTE: This sheet is a summary. It may not cover all possible information. If you have questions about this medicine, talk to your doctor, pharmacist, or health care provider.  2020 Elsevier/Gold Standard (2014-04-28 14:19:39)  Coronavirus (COVID-19) Are you at risk?  Are you at risk for the Coronavirus (COVID-19)?  To be considered HIGH RISK for Coronavirus (COVID-19), you have to meet the following criteria:  . Traveled to [place], [place], [place], [place] or [place]; or in the [place] to [place], [place], [place], or [place]; and have fever, cough, and shortness of breath within the last 2 weeks of travel OR . Been in close contact with a person diagnosed with COVID-19 within the last 2 weeks and have fever, cough, and shortness of breath . IF YOU DO NOT MEET THESE CRITERIA, YOU ARE CONSIDERED LOW RISK FOR COVID-19.  What to do if you are HIGH RISK for COVID-19?  Marland Kitchen If you are having a medical emergency, call 911. . Seek medical care right away. Before you go to a doctor's office, urgent care or emergency department, call ahead and tell them about your recent travel, contact with someone diagnosed with COVID-19,  and your symptoms. You should receive instructions from your physician's office regarding next steps of care.  . When you arrive at healthcare provider, tell the healthcare staff immediately you have returned from visiting [place], [place], [place], [place] or [place]; or traveled in the [place] to [place], [place], [place], or [place]; in the last two weeks or you have been in close contact with a person diagnosed with COVID-19 in the last 2 weeks.   . Tell the health care staff about your symptoms: fever, cough and shortness of breath. . After you have been seen by a medical provider, you will be either: o Tested for (COVID-19) and discharged home on quarantine except to  seek medical care if symptoms worsen, and asked to  - Stay home and avoid contact with others until you get your results (4-5 days)  - Avoid travel on public transportation if possible (such as bus, train, or airplane) or o Sent to the Emergency Department by EMS for evaluation, COVID-19 testing, and possible admission depending on your condition and test results.  What to do if you are LOW RISK for COVID-19?  Reduce your risk of any infection by using the same precautions used for avoiding the common cold or flu:  Marland Kitchen Wash your hands often with soap and warm water for at least 20 seconds.  If soap and water are not readily available, use an alcohol-based hand sanitizer with at least 60% alcohol.  . If coughing or sneezing, cover your mouth and nose by coughing or sneezing into the elbow areas of your shirt or coat, into a tissue or into your sleeve (not your hands). . Avoid shaking hands with others and consider head nods or verbal greetings only. . Avoid touching your eyes, nose, or mouth with unwashed hands.  . Avoid close contact with people who are sick. . Avoid places or events with large numbers of people in one location, like concerts or sporting events. . Carefully consider travel plans you have or are making. . If you are planning any travel outside or inside the [name], visit the CDC's Travelers' Health webpage for the latest health notices. . If you have some symptoms but not all symptoms, continue to monitor at home and seek medical attention if your symptoms worsen. . If you are having a medical emergency, call 911.   [place] / e-Visit: [email or web address]         [facility]: [facility]: [id number]                   [facility]: [phone]

## 2020-01-01 NOTE — Patient Instructions (Signed)

## 2020-01-08 ENCOUNTER — Telehealth: Payer: Self-pay | Admitting: Obstetrics and Gynecology

## 2020-01-08 NOTE — Telephone Encounter (Signed)
Patient does not need to come in for routine gynecologic care.  Please let her know that I am here for her if any gynecologic need arises. I would like to support her in any way possible.

## 2020-01-08 NOTE — Telephone Encounter (Signed)
Patient is calling regarding rescheduling AEX. 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 Patient is calling to see if Dr. Quincy Simmonds believes she needs to be seen for her routine GYN exam or if it is unnecessary. Patient stated that she is not having any gynecological issues at this time.

## 2020-01-08 NOTE — Telephone Encounter (Signed)
Routing to Dr. Quincy Simmonds to review and advise.

## 2020-01-08 NOTE — Telephone Encounter (Signed)
Spoke with patient. Advised per Dr. Silva. Patient verbalizes understanding and is agreeable.   Encounter closed.  

## 2020-01-10 ENCOUNTER — Encounter: Payer: Self-pay | Admitting: Hematology

## 2020-01-10 ENCOUNTER — Other Ambulatory Visit: Payer: Self-pay | Admitting: *Deleted

## 2020-01-10 ENCOUNTER — Other Ambulatory Visit: Payer: Self-pay | Admitting: Hematology

## 2020-01-10 MED ORDER — FENTANYL 12 MCG/HR TD PT72: 1.0000 | MEDICATED_PATCH | TRANSDERMAL | 0 refills | Status: DC

## 2020-01-15 ENCOUNTER — Inpatient Hospital Stay: Payer: Medicare PPO

## 2020-01-15 ENCOUNTER — Encounter: Payer: Self-pay | Admitting: Hematology

## 2020-01-15 ENCOUNTER — Ambulatory Visit: Payer: Medicare Other | Admitting: Hematology

## 2020-01-15 ENCOUNTER — Other Ambulatory Visit: Payer: Self-pay | Admitting: *Deleted

## 2020-01-15 ENCOUNTER — Other Ambulatory Visit: Payer: Self-pay

## 2020-01-15 ENCOUNTER — Inpatient Hospital Stay: Payer: Medicare PPO | Attending: Hematology

## 2020-01-15 VITALS — BP 136/73 | HR 87 | Temp 97.9°F | Resp 16 | Ht 59.0 in | Wt 164.5 lb

## 2020-01-15 DIAGNOSIS — C9000 Multiple myeloma not having achieved remission: Secondary | ICD-10-CM

## 2020-01-15 DIAGNOSIS — Z7189 Other specified counseling: Secondary | ICD-10-CM

## 2020-01-15 DIAGNOSIS — Z5112 Encounter for antineoplastic immunotherapy: Secondary | ICD-10-CM | POA: Insufficient documentation

## 2020-01-15 DIAGNOSIS — Z95828 Presence of other vascular implants and grafts: Secondary | ICD-10-CM

## 2020-01-15 LAB — CBC WITH DIFFERENTIAL/PLATELET
Abs Immature Granulocytes: 0.03 10*3/uL (ref 0.00–0.07)
Basophils Absolute: 0.1 10*3/uL (ref 0.0–0.1)
Basophils Relative: 1 %
Eosinophils Absolute: 0.5 10*3/uL (ref 0.0–0.5)
Eosinophils Relative: 7 %
HCT: 39.9 % (ref 36.0–46.0)
Hemoglobin: 13.5 g/dL (ref 12.0–15.0)
Immature Granulocytes: 1 %
Lymphocytes Relative: 20 %
Lymphs Abs: 1.3 10*3/uL (ref 0.7–4.0)
MCH: 32.8 pg (ref 26.0–34.0)
MCHC: 33.8 g/dL (ref 30.0–36.0)
MCV: 97.1 fL (ref 80.0–100.0)
Monocytes Absolute: 1.1 10*3/uL — ABNORMAL HIGH (ref 0.1–1.0)
Monocytes Relative: 17 %
Neutro Abs: 3.5 10*3/uL (ref 1.7–7.7)
Neutrophils Relative %: 54 %
Platelets: 174 10*3/uL (ref 150–400)
RBC: 4.11 MIL/uL (ref 3.87–5.11)
RDW: 13.4 % (ref 11.5–15.5)
WBC: 6.5 10*3/uL (ref 4.0–10.5)
nRBC: 0 % (ref 0.0–0.2)

## 2020-01-15 LAB — CMP (CANCER CENTER ONLY)
ALT: 20 U/L (ref 0–44)
AST: 12 U/L — ABNORMAL LOW (ref 15–41)
Albumin: 3.6 g/dL (ref 3.5–5.0)
Alkaline Phosphatase: 75 U/L (ref 38–126)
Anion gap: 11 (ref 5–15)
BUN: 14 mg/dL (ref 8–23)
CO2: 22 mmol/L (ref 22–32)
Calcium: 9.4 mg/dL (ref 8.9–10.3)
Chloride: 105 mmol/L (ref 98–111)
Creatinine: 0.84 mg/dL (ref 0.44–1.00)
GFR, Est AFR Am: >60 mL/min (ref >60)
GFR, Estimated: >60 mL/min (ref >60)
Glucose, Bld: 182 mg/dL — ABNORMAL HIGH (ref 70–99)
Potassium: 3.5 mmol/L (ref 3.5–5.1)
Sodium: 138 mmol/L (ref 135–145)
Total Bilirubin: 0.7 mg/dL (ref 0.3–1.2)
Total Protein: 6.4 g/dL — ABNORMAL LOW (ref 6.5–8.1)

## 2020-01-15 MED ORDER — PROCHLORPERAZINE MALEATE 10 MG PO TABS
ORAL_TABLET | ORAL | Status: AC
  Filled 2020-01-15: qty 1

## 2020-01-15 MED ORDER — ZOLEDRONIC ACID 4 MG/100ML IV SOLN
INTRAVENOUS | Status: AC
  Filled 2020-01-15: qty 100

## 2020-01-15 MED ORDER — DIPHENHYDRAMINE HCL 25 MG PO CAPS
ORAL_CAPSULE | ORAL | Status: AC
  Filled 2020-01-15: qty 1

## 2020-01-15 MED ORDER — SODIUM CHLORIDE 0.9% FLUSH
10.0000 mL | Freq: Once | INTRAVENOUS | Status: AC
  Administered 2020-01-15: 10 mL
  Filled 2020-01-15: qty 10

## 2020-01-15 MED ORDER — HEPARIN SOD (PORK) LOCK FLUSH 100 UNIT/ML IV SOLN
500.0000 [IU] | Freq: Once | INTRAVENOUS | Status: AC | PRN
  Administered 2020-01-15: 500 [IU]
  Filled 2020-01-15: qty 5

## 2020-01-15 MED ORDER — VITAMIN D (ERGOCALCIFEROL) 1.25 MG (50000 UNIT) PO CAPS: ORAL_CAPSULE | ORAL | 0 refills | Status: DC

## 2020-01-15 MED ORDER — FAMOTIDINE 20 MG PO TABS
ORAL_TABLET | ORAL | Status: AC
  Filled 2020-01-15: qty 1

## 2020-01-15 MED ORDER — SODIUM CHLORIDE 0.9 % IV SOLN
Freq: Once | INTRAVENOUS | Status: AC
  Filled 2020-01-15: qty 250

## 2020-01-15 MED ORDER — DEXAMETHASONE 4 MG PO TABS
ORAL_TABLET | ORAL | Status: AC
  Filled 2020-01-15: qty 5

## 2020-01-15 MED ORDER — DEXAMETHASONE 4 MG PO TABS
20.0000 mg | ORAL_TABLET | Freq: Once | ORAL | Status: AC
  Administered 2020-01-15: 20 mg via ORAL

## 2020-01-15 MED ORDER — LIDOCAINE-PRILOCAINE 2.5-2.5 % EX CREA: TOPICAL_CREAM | CUTANEOUS | 0 refills | Status: DC

## 2020-01-15 MED ORDER — ACETAMINOPHEN 325 MG PO TABS
650.0000 mg | ORAL_TABLET | Freq: Once | ORAL | Status: AC
  Administered 2020-01-15: 650 mg via ORAL

## 2020-01-15 MED ORDER — ONDANSETRON HCL 8 MG PO TABS: 8.0000 mg | ORAL_TABLET | Freq: Two times a day (BID) | ORAL | 1 refills | Status: DC | PRN

## 2020-01-15 MED ORDER — FAMOTIDINE 20 MG PO TABS
20.0000 mg | ORAL_TABLET | Freq: Once | ORAL | Status: AC
  Administered 2020-01-15: 14:00:00 20 mg via ORAL

## 2020-01-15 MED ORDER — ACYCLOVIR 400 MG PO TABS: ORAL_TABLET | ORAL | 1 refills | Status: DC

## 2020-01-15 MED ORDER — ZOLEDRONIC ACID 4 MG/100ML IV SOLN
4.0000 mg | Freq: Once | INTRAVENOUS | Status: AC
  Administered 2020-01-15: 4 mg via INTRAVENOUS

## 2020-01-15 MED ORDER — DIPHENHYDRAMINE HCL 25 MG PO TABS
25.0000 mg | ORAL_TABLET | Freq: Once | ORAL | Status: AC
  Administered 2020-01-15: 25 mg via ORAL
  Filled 2020-01-15: qty 1

## 2020-01-15 MED ORDER — ACETAMINOPHEN 325 MG PO TABS
ORAL_TABLET | ORAL | Status: AC
  Filled 2020-01-15: qty 2

## 2020-01-15 MED ORDER — PROCHLORPERAZINE MALEATE 10 MG PO TABS
10.0000 mg | ORAL_TABLET | Freq: Once | ORAL | Status: AC
  Administered 2020-01-15: 10 mg via ORAL

## 2020-01-15 MED ORDER — SODIUM CHLORIDE 0.9 % IV SOLN
Freq: Once | INTRAVENOUS | Status: DC
  Filled 2020-01-15: qty 250

## 2020-01-15 MED ORDER — DEXTROSE 5 % IV SOLN
71.0000 mg/m2 | Freq: Once | INTRAVENOUS | Status: AC
  Administered 2020-01-15: 15:00:00 130 mg via INTRAVENOUS
  Filled 2020-01-15: qty 5

## 2020-01-15 MED ORDER — SODIUM CHLORIDE 0.9% FLUSH
10.0000 mL | INTRAVENOUS | Status: DC | PRN
  Administered 2020-01-15: 10 mL
  Filled 2020-01-15: qty 10

## 2020-01-15 MED ORDER — OXYCODONE HCL 10 MG PO TABS: 10.0000 mg | ORAL_TABLET | Freq: Four times a day (QID) | ORAL | 0 refills | Status: DC | PRN

## 2020-01-15 NOTE — Telephone Encounter (Signed)
Requested refills of Revlimid, EMLA creme, Vitamin D 50,000U, Acyclovir 400mg , Ondansetron 8 MG, Oxycodone 10 mg via mychart.

## 2020-01-15 NOTE — Patient Instructions (Addendum)
[facility] Discharge Instructions for Patients Receiving Chemotherapy  Today you received the following chemotherapy agents:  Kyprolis  To help prevent nausea and vomiting after your treatment, we encourage you to take your nausea medication as prescribed.   If you develop nausea and vomiting that is not controlled by your nausea medication, call the clinic.   BELOW ARE SYMPTOMS THAT SHOULD BE REPORTED IMMEDIATELY:  *FEVER GREATER THAN 100.5 F  *CHILLS WITH OR WITHOUT FEVER  NAUSEA AND VOMITING THAT IS NOT CONTROLLED WITH YOUR NAUSEA MEDICATION  *UNUSUAL SHORTNESS OF BREATH  *UNUSUAL BRUISING OR BLEEDING  TENDERNESS IN MOUTH AND THROAT WITH OR WITHOUT PRESENCE OF ULCERS  *URINARY PROBLEMS  *BOWEL PROBLEMS  UNUSUAL RASH Items with * indicate a potential emergency and should be followed up as soon as possible.  Feel free to call the clinic should you have any questions or concerns. The clinic phone number is [phone].  Please show the [place] at check-in to the Emergency Department and triage nurse.  Zoledronic Acid injection (Hypercalcemia, Oncology) What is this medicine? ZOLEDRONIC ACID (ZOE le dron ik AS id) lowers the amount of calcium loss from bone. It is used to treat too much calcium in your blood from cancer. It is also used to prevent complications of cancer that has spread to the bone. This medicine may be used for other purposes; ask your health care provider or pharmacist if you have questions. COMMON BRAND NAME(S): Zometa What should I tell my health care provider before I take this medicine? They need to know if you have any of these conditions:  aspirin-sensitive asthma  cancer, especially if you are receiving medicines used to treat cancer  dental disease or wear dentures  infection  kidney disease  receiving corticosteroids like dexamethasone or prednisone  an unusual or allergic reaction to zoledronic acid, other  medicines, foods, dyes, or preservatives  pregnant or trying to get pregnant  breast-feeding How should I use this medicine? This medicine is for infusion into a vein. It is given by a health care professional in a hospital or clinic setting. Talk to your pediatrician regarding the use of this medicine in children. Special care may be needed. Overdosage: If you think you have taken too much of this medicine contact a poison control center or emergency room at once. NOTE: This medicine is only for you. Do not share this medicine with others. What if I miss a dose? It is important not to miss your dose. Call your doctor or health care professional if you are unable to keep an appointment. What may interact with this medicine?  certain antibiotics given by injection  NSAIDs, medicines for pain and inflammation, like ibuprofen or naproxen  some diuretics like bumetanide, furosemide  teriparatide  thalidomide This list may not describe all possible interactions. Give your health care provider a list of all the medicines, herbs, non-prescription drugs, or dietary supplements you use. Also tell them if you smoke, drink alcohol, or use illegal drugs. Some items may interact with your medicine. What should I watch for while using this medicine? Visit your doctor or health care professional for regular checkups. It may be some time before you see the benefit from this medicine. Do not stop taking your medicine unless your doctor tells you to. Your doctor may order blood tests or other tests to see how you are doing. Women should inform their doctor if they wish to become pregnant or think they might be  pregnant. There is a potential for serious side effects to an unborn child. Talk to your health care professional or pharmacist for more information. You should make sure that you get enough calcium and vitamin D while you are taking this medicine. Discuss the foods you eat and the vitamins you take  with your health care professional. Some people who take this medicine have severe bone, joint, and/or muscle pain. This medicine may also increase your risk for jaw problems or a broken thigh bone. Tell your doctor right away if you have severe pain in your jaw, bones, joints, or muscles. Tell your doctor if you have any pain that does not go away or that gets worse. Tell your dentist and dental surgeon that you are taking this medicine. You should not have major dental surgery while on this medicine. See your dentist to have a dental exam and fix any dental problems before starting this medicine. Take good care of your teeth while on this medicine. Make sure you see your dentist for regular follow-up appointments. What side effects may I notice from receiving this medicine? Side effects that you should report to your doctor or health care professional as soon as possible:  allergic reactions like skin rash, itching or hives, swelling of the face, lips, or tongue  anxiety, confusion, or depression  breathing problems  changes in vision  eye pain  feeling faint or lightheaded, falls  jaw pain, especially after dental work  mouth sores  muscle cramps, stiffness, or weakness  redness, blistering, peeling or loosening of the skin, including inside the mouth  trouble passing urine or change in the amount of urine Side effects that usually do not require medical attention (report to your doctor or health care professional if they continue or are bothersome):  bone, joint, or muscle pain  constipation  diarrhea  fever  hair loss  irritation at site where injected  loss of appetite  nausea, vomiting  stomach upset  trouble sleeping  trouble swallowing  weak or tired This list may not describe all possible side effects. Call your doctor for medical advice about side effects. You may report side effects to FDA at 1-800-FDA-1088. Where should I keep my medicine? This drug  is given in a hospital or clinic and will not be stored at home. NOTE: This sheet is a summary. It may not cover all possible information. If you have questions about this medicine, talk to your doctor, pharmacist, or health care provider.  2020 Elsevier/Gold Standard (2014-04-28 14:19:39)

## 2020-01-15 NOTE — Patient Instructions (Signed)

## 2020-01-18 ENCOUNTER — Other Ambulatory Visit: Payer: Self-pay

## 2020-01-18 ENCOUNTER — Ambulatory Visit (HOSPITAL_COMMUNITY)
Admission: RE | Admit: 2020-01-18 | Discharge: 2020-01-18 | Disposition: A | Discharge status: Home / Self Care | Payer: Medicare PPO | Source: Ambulatory Visit | Attending: Hematology | Admitting: Hematology

## 2020-01-18 ENCOUNTER — Other Ambulatory Visit: Payer: Self-pay | Admitting: Hematology

## 2020-01-18 DIAGNOSIS — Z5111 Encounter for antineoplastic chemotherapy: Secondary | ICD-10-CM

## 2020-01-18 DIAGNOSIS — C9000 Multiple myeloma not having achieved remission: Secondary | ICD-10-CM

## 2020-01-18 DIAGNOSIS — Z79899 Other long term (current) drug therapy: Secondary | ICD-10-CM | POA: Insufficient documentation

## 2020-01-18 LAB — GLUCOSE, CAPILLARY: Glucose-Capillary: 120 mg/dL — ABNORMAL HIGH (ref 70–99)

## 2020-01-18 MED ORDER — FLUDEOXYGLUCOSE F - 18 (FDG) INJECTION
8.1600 | Freq: Once | INTRAVENOUS | Status: AC
  Administered 2020-01-18: 8.16 via INTRAVENOUS

## 2020-01-19 ENCOUNTER — Other Ambulatory Visit: Payer: Self-pay | Admitting: *Deleted

## 2020-01-19 DIAGNOSIS — C9000 Multiple myeloma not having achieved remission: Secondary | ICD-10-CM

## 2020-01-19 MED ORDER — LENALIDOMIDE 15 MG PO CAPS: ORAL_CAPSULE | ORAL | 0 refills | Status: DC

## 2020-01-19 NOTE — Telephone Encounter (Signed)
Requested refill of Revlimid Refilled per Dr. Irene Limbo OV note 01/01/2020 Refill escribed to [facility] ([place] [place] # [id number], 01/19/20

## 2020-01-25 ENCOUNTER — Other Ambulatory Visit: Payer: Self-pay | Admitting: Internal Medicine

## 2020-01-25 ENCOUNTER — Encounter: Payer: Self-pay | Admitting: Hematology

## 2020-01-26 ENCOUNTER — Other Ambulatory Visit: Payer: Self-pay

## 2020-01-26 DIAGNOSIS — C9000 Multiple myeloma not having achieved remission: Secondary | ICD-10-CM

## 2020-01-26 MED ORDER — DOCUSATE SODIUM 100 MG PO CAPS: 200.0000 mg | ORAL_CAPSULE | Freq: Every day | ORAL | 1 refills | Status: DC

## 2020-01-29 ENCOUNTER — Inpatient Hospital Stay: Payer: Medicare PPO

## 2020-01-29 ENCOUNTER — Other Ambulatory Visit: Payer: Self-pay

## 2020-01-29 ENCOUNTER — Inpatient Hospital Stay: Payer: Medicare PPO | Admitting: Hematology

## 2020-01-29 ENCOUNTER — Other Ambulatory Visit: Payer: Self-pay | Admitting: Hematology

## 2020-01-29 VITALS — BP 129/72 | HR 75 | Temp 99.2°F | Resp 18 | Ht 59.0 in | Wt 165.3 lb

## 2020-01-29 DIAGNOSIS — Z5111 Encounter for antineoplastic chemotherapy: Secondary | ICD-10-CM | POA: Diagnosis not present

## 2020-01-29 DIAGNOSIS — Z7189 Other specified counseling: Secondary | ICD-10-CM

## 2020-01-29 DIAGNOSIS — Z95828 Presence of other vascular implants and grafts: Secondary | ICD-10-CM

## 2020-01-29 DIAGNOSIS — Z5112 Encounter for antineoplastic immunotherapy: Secondary | ICD-10-CM | POA: Diagnosis not present

## 2020-01-29 DIAGNOSIS — C9000 Multiple myeloma not having achieved remission: Secondary | ICD-10-CM

## 2020-01-29 DIAGNOSIS — E876 Hypokalemia: Secondary | ICD-10-CM

## 2020-01-29 LAB — CMP (CANCER CENTER ONLY)
ALT: 16 U/L (ref 0–44)
AST: 13 U/L — ABNORMAL LOW (ref 15–41)
Albumin: 3.5 g/dL (ref 3.5–5.0)
Alkaline Phosphatase: 73 U/L (ref 38–126)
Anion gap: 10 (ref 5–15)
BUN: 14 mg/dL (ref 8–23)
CO2: 23 mmol/L (ref 22–32)
Calcium: 9.4 mg/dL (ref 8.9–10.3)
Chloride: 107 mmol/L (ref 98–111)
Creatinine: 0.84 mg/dL (ref 0.44–1.00)
GFR, Est AFR Am: >60 mL/min (ref >60)
GFR, Estimated: >60 mL/min (ref >60)
Glucose, Bld: 196 mg/dL — ABNORMAL HIGH (ref 70–99)
Potassium: 3.1 mmol/L — ABNORMAL LOW (ref 3.5–5.1)
Sodium: 140 mmol/L (ref 135–145)
Total Bilirubin: 0.7 mg/dL (ref 0.3–1.2)
Total Protein: 6.3 g/dL — ABNORMAL LOW (ref 6.5–8.1)

## 2020-01-29 LAB — CBC WITH DIFFERENTIAL/PLATELET
Abs Immature Granulocytes: 0.01 10*3/uL (ref 0.00–0.07)
Basophils Absolute: 0.1 10*3/uL (ref 0.0–0.1)
Basophils Relative: 1 %
Eosinophils Absolute: 0.1 10*3/uL (ref 0.0–0.5)
Eosinophils Relative: 1 %
HCT: 36.5 % (ref 36.0–46.0)
Hemoglobin: 12.5 g/dL (ref 12.0–15.0)
Immature Granulocytes: 0 %
Lymphocytes Relative: 21 %
Lymphs Abs: 1.1 10*3/uL (ref 0.7–4.0)
MCH: 32.6 pg (ref 26.0–34.0)
MCHC: 34.2 g/dL (ref 30.0–36.0)
MCV: 95.1 fL (ref 80.0–100.0)
Monocytes Absolute: 1 10*3/uL (ref 0.1–1.0)
Monocytes Relative: 20 %
Neutro Abs: 2.8 10*3/uL (ref 1.7–7.7)
Neutrophils Relative %: 57 %
Platelets: 231 10*3/uL (ref 150–400)
RBC: 3.84 MIL/uL — ABNORMAL LOW (ref 3.87–5.11)
RDW: 13.7 % (ref 11.5–15.5)
WBC: 5 10*3/uL (ref 4.0–10.5)
nRBC: 0 % (ref 0.0–0.2)

## 2020-01-29 MED ORDER — POTASSIUM CHLORIDE CRYS ER 20 MEQ PO TBCR
EXTENDED_RELEASE_TABLET | ORAL | Status: AC
  Filled 2020-01-29: qty 2

## 2020-01-29 MED ORDER — ACETAMINOPHEN 325 MG PO TABS
650.0000 mg | ORAL_TABLET | Freq: Once | ORAL | Status: AC
  Administered 2020-01-29: 650 mg via ORAL

## 2020-01-29 MED ORDER — SODIUM CHLORIDE 0.9% FLUSH
10.0000 mL | Freq: Once | INTRAVENOUS | Status: AC
  Administered 2020-01-29: 10 mL
  Filled 2020-01-29: qty 10

## 2020-01-29 MED ORDER — SODIUM CHLORIDE 0.9 % IV SOLN
Freq: Once | INTRAVENOUS | Status: AC
  Filled 2020-01-29: qty 250

## 2020-01-29 MED ORDER — PROCHLORPERAZINE MALEATE 10 MG PO TABS
ORAL_TABLET | ORAL | Status: AC
  Filled 2020-01-29: qty 1

## 2020-01-29 MED ORDER — FAMOTIDINE 20 MG PO TABS
20.0000 mg | ORAL_TABLET | Freq: Once | ORAL | Status: AC
  Administered 2020-01-29: 20 mg via ORAL

## 2020-01-29 MED ORDER — POTASSIUM CHLORIDE CRYS ER 20 MEQ PO TBCR: 20.0000 meq | EXTENDED_RELEASE_TABLET | Freq: Two times a day (BID) | ORAL | 1 refills | Status: DC

## 2020-01-29 MED ORDER — PROCHLORPERAZINE MALEATE 10 MG PO TABS
10.0000 mg | ORAL_TABLET | Freq: Once | ORAL | Status: AC
  Administered 2020-01-29: 10 mg via ORAL

## 2020-01-29 MED ORDER — POTASSIUM CHLORIDE CRYS ER 20 MEQ PO TBCR: 40.0000 meq | EXTENDED_RELEASE_TABLET | Freq: Once | ORAL | Status: DC

## 2020-01-29 MED ORDER — HEPARIN SOD (PORK) LOCK FLUSH 100 UNIT/ML IV SOLN
500.0000 [IU] | Freq: Once | INTRAVENOUS | Status: AC | PRN
  Administered 2020-01-29: 500 [IU]
  Filled 2020-01-29: qty 5

## 2020-01-29 MED ORDER — SODIUM CHLORIDE 0.9% FLUSH
10.0000 mL | INTRAVENOUS | Status: DC | PRN
  Administered 2020-01-29: 10 mL
  Filled 2020-01-29: qty 10

## 2020-01-29 MED ORDER — DEXAMETHASONE 4 MG PO TABS
20.0000 mg | ORAL_TABLET | Freq: Once | ORAL | Status: AC
  Administered 2020-01-29: 20 mg via ORAL

## 2020-01-29 MED ORDER — ACETAMINOPHEN 325 MG PO TABS
ORAL_TABLET | ORAL | Status: AC
  Filled 2020-01-29: qty 2

## 2020-01-29 MED ORDER — DEXTROSE 5 % IV SOLN
55.0000 mg/m2 | Freq: Once | INTRAVENOUS | Status: AC
  Administered 2020-01-29: 100 mg via INTRAVENOUS
  Filled 2020-01-29: qty 30

## 2020-01-29 MED ORDER — DIPHENHYDRAMINE HCL 25 MG PO TABS
25.0000 mg | ORAL_TABLET | Freq: Once | ORAL | Status: AC
  Administered 2020-01-29: 25 mg via ORAL
  Filled 2020-01-29: qty 1

## 2020-01-29 MED ORDER — SODIUM CHLORIDE 0.9 % IV SOLN
Freq: Once | INTRAVENOUS | Status: DC
  Filled 2020-01-29: qty 250

## 2020-01-29 MED ORDER — DEXAMETHASONE 4 MG PO TABS
ORAL_TABLET | ORAL | Status: AC
  Filled 2020-01-29: qty 5

## 2020-01-29 MED ORDER — DIPHENHYDRAMINE HCL 25 MG PO CAPS
ORAL_CAPSULE | ORAL | Status: AC
  Filled 2020-01-29: qty 1

## 2020-01-29 MED ORDER — POTASSIUM CHLORIDE CRYS ER 20 MEQ PO TBCR
40.0000 meq | EXTENDED_RELEASE_TABLET | Freq: Once | ORAL | Status: AC
  Administered 2020-01-29: 40 meq via ORAL

## 2020-01-29 MED ORDER — FAMOTIDINE 20 MG PO TABS
ORAL_TABLET | ORAL | Status: AC
  Filled 2020-01-29: qty 1

## 2020-01-29 NOTE — Progress Notes (Signed)
HEMATOLOGY/ONCOLOGY CLINIC NOTE  Date of Service: 01/29/2020  Patient Care Team: Hoyt Koch, MD as PCP - General (Internal Medicine) Lafayette Dragon, MD (Inactive) as Consulting Physician (Gastroenterology) Megan Salon, MD as Consulting Physician (Gynecology) Melrose Nakayama, MD as Consulting Physician (Orthopedic Surgery) Deneise Lever, MD as Consulting Physician (Pulmonary Disease) [name], MD (Ophthalmology)  CHIEF COMPLAINTS/PURPOSE OF CONSULTATION:  Continue mx of myeloma  HISTORY OF PRESENTING ILLNESS:   Faith Orr is a wonderful 79 y.o. female who has been referred to [name] by Dr. Pricilla Holm for evaluation and management of Lytic bone lesions. She is accompanied today by her son in law, and her partner is present via phone. The pt reports that she is doing well overall.   The pt notes that 2-3 months ago while standing at the stove, and otherwise feeling normally, she felt "something snap that took her breath away" in her mid back as she stretched to get something. The pt notes that she saw a chiropractor twice due to her back stiffness, which did not help. The pt then described her new bone pains to her PCP on 12/05/18, and subsequent imaging, as noted below, revealed concerns for numerous bone lesions. She has begun 29m Fosamax. The pt notes that she had hip pain in 2018, and that an XR at that time did not reveal any lesions.  The pt notes that most of her pain is concentrated to her left shoulder presently, and with minimal movement of the arm. She endorses pain radiating into her left arm and notes that her hand is swollen in the mornings when she wakes up. She also endorses present back pain and right hip pain, worse when she walks. She has not yet seen orthopedics. The pt reports that she is needing to take 6074mAdvil every 4-6 hours as Tramadol alone has not been able to alleviate her pain. The pt denies any unexpected weight loss, fevers,  chills, or night sweats. The pt notes that her urine is a very dark color presently, but denies overt blood in the urine, underpants, nor tissue paper. The pt notes that in the last two weeks she has had some soreness in her head, but denies new headaches or changes in vision. The pt notes that she has been urinating more frequently overall, but has been trying to stay better hydrated as well.  The pt notes that she has been compliant with annual mammograms.   The pt notes that she had a cyst in her right breast which was removed in the past. She fractured her left wrist in 2008 after falling down stairs. The pt endorses history of fatty liver.   The pt denies ever smoking cigarettes and endorses significant second hand smoke exposure with a previous marriage.   Of note prior to the patient's visit today, pt has had a Bone Scan completed on 12/13/18 with results revealing Multifocal uptake throughout the skeleton, consistent with diffuse metastatic disease. Primary tumor is not specified. 2. Uptake in the proximal right femur, consistent with lytic lesions. 3. Uptake in the ribs bilaterally as described. 4. Lesions in the proximal left humerus. 5. Diffuse uptake throughout the skull consistent with metastatic disease. 6. Right paramedian uptake at the manubrium.  Most recent lab results (12/08/18) of CBC w/diff and CMP is as follows: all values are WNL except for Glucose at 279, BUN at 24, AST at 41, ALT at 46. 12/08/18 SPEP revealed all values WNL except for Total  Protein at 6.0, Albumin at 3.6, Gamma globulin at 0.7, and M spike at 0.5g  On review of systems, pt reports significant left shoulder pain, back pain, right hip pain, dark urine, and denies fevers, chills, night sweats, unexpected weight loss, changes in bowel habits, changes in breathing, cough, new respiratory symptoms, changes in vision, abdominal pains, leg swelling, and any other symptoms.   On PMHx the pt reports fatty liver, and  denies blood clots.  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 Denies ever smoking.  On Family Hx the pt reports maternal grandmother with colon cancer. Father with bladder cancer and amyloidosis (pt notes that this could have been misdiagnosis). Mother with Protein S deficiency and polymyalgia rheumatica.  Current Treatment: cycle 13 day 15   Interval History:  [name] returns today for management and evaluation of her Multiple Myeloma and C13D15 of maintenance Carfilzomib, Revlimid, and Dexamethasone. The patient's last visit with [name] was on 01/01/2020. The pt reports that she is doing well overall.  The pt reports that she feels very fatigued for about four days after her treatment. Pt affirms that she has been eating and drinking well. She is currently on a waiting list for the COVID19 vaccine.   Of note since the patient's last visit, pt has had PET/CT ([id number]) completed on 01/18/2020 with results revealing "1. Stable lytic lesions throughout the skeleton. The larger lytic lesions which had mild metabolic activity on comparison exam now have background metabolic activity. No evidence of active myeloma. No evidence of progression multiple myeloma.  No plasmacytoma 3. Hypermetabolic nodules in the LEFT neck may be associated deep tissues of the LEFT parotid gland. Consider primary parotid neoplasm as etiology for these intensity metabolic small lesions lesions."  Lab results today (01/29/20) of CBC w/diff and CMP is as follows: all values are WNL except for RBC at 3.84, Potassium at 3.1, Glucose at 196, Total Protein at 6.3, AST at 13.  On review of systems, pt reports fatigue, healthy appetite and denies fevers, vomiting and any other symptoms.   MEDICAL HISTORY:  Past Medical History:  Diagnosis Date  . Allergy    seasonal  . Asthma   . DEPRESSION   . DIABETES MELLITUS, TYPE II   . Diverticulosis   . HYPERLIPIDEMIA   .  Macular degeneration of left eye    mild, Dr.Hecker  . Obesity, unspecified   . Osteoarthritis of both knees   . OSTEOPENIA   . Osteopenia   . URINARY INCONTINENCE     SURGICAL HISTORY: Past Surgical History:  Procedure Laterality Date  . CATARACT EXTRACTION Left 05/24/2018  . CESAREAN SECTION  01/1973  . CYSTOSCOPY/URETEROSCOPY/HOLMIUM LASER/STENT PLACEMENT Right 09/20/2019   Procedure: CYSTOSCOPY/URETEROSCOPY/HOLMIUM LASER/STENT PLACEMENT;  Surgeon: Lucas Mallow, MD;  Location: [facility];  Service: Urology;  Laterality: Right;  . FRACTURE SURGERY    . IR IMAGING GUIDED PORT INSERTION  02/20/2019  . left wrist surgery  2008   By Dr. Latanya Maudlin  . right ankle  1994    SOCIAL HISTORY: Social History   Socioeconomic History  . Marital status: Married    Spouse name: Not on file  . Number of children: 1  . Years of education: Not on file  . Highest education level: Not on file  Occupational History    Employer: [employer]  Tobacco Use  . Smoking status: Never Smoker  . Smokeless tobacco: Never Used  .  Tobacco comment: Lives with partner [name]) and son  Substance and Sexual Activity  . Alcohol use: No    Alcohol/week: 0.0 standard drinks  . Drug use: No  . Sexual activity: Never    Partners: Female    Birth control/protection: Post-menopausal    Comment: Lives with female partner (annette hicks) and 37 yo son  Other Topics Concern  . Not on file  Social History Narrative  . Not on file   Social Determinants of Health   Financial Resource Strain:   . Difficulty of Paying Living Expenses: Not on file  Food Insecurity:   . Worried About Charity fundraiser in the Last Year: Not on file  . Ran Out of Food in the Last Year: Not on file  Transportation Needs:   . Lack of Transportation (Medical): Not on file  . Lack of Transportation (Non-Medical): Not on file  Physical Activity:   . Days of Exercise per Week: Not on file  .  Minutes of Exercise per Session: Not on file  Stress:   . Feeling of Stress : Not on file  Social Connections:   . Frequency of Communication with Friends and Family: Not on file  . Frequency of Social Gatherings with Friends and Family: Not on file  . Attends Religious Services: Not on file  . Active Member of Clubs or Organizations: Not on file  . Attends Archivist Meetings: Not on file  . Marital Status: Not on file  Intimate Partner Violence:   . Fear of Current or Ex-Partner: Not on file  . Emotionally Abused: Not on file  . Physically Abused: Not on file  . Sexually Abused: Not on file    FAMILY HISTORY: Family History  Problem Relation Age of Onset  . Diabetes Father   . Hyperlipidemia Father   . Heart disease Father   . Cancer Father   . Hypertension Father   . Colon cancer Paternal Grandmother 50  . Osteoporosis Mother   . Protein S deficiency Mother   . Hyperlipidemia Mother   . Multiple sclerosis Daughter   . Cancer Other        bladder  . Breast cancer Neg Hx     ALLERGIES:  is allergic to penicillins; aleve [naproxen sodium]; and sulfonamide derivatives.  MEDICATIONS:  Current Outpatient Medications  Medication Sig Dispense Refill  . acyclovir (ZOVIRAX) 400 MG tablet TAKE 1 TABLET(400 MG) BY MOUTH TWICE DAILY 60 tablet 1  . aspirin EC 81 MG tablet Take 81 mg by mouth daily after breakfast.     . Blood Glucose Monitoring Suppl (FREESTYLE FREEDOM LITE) W/DEVICE KIT Use to check blood sugars twice a day Dx 250.00 1 each 0  . Calcium Carbonate-Vitamin D (CALCIUM 600+D HIGH POTENCY) 600-400 MG-UNIT per tablet Take 1 tablet by mouth 2 (two) times daily.     . Cetirizine HCl 10 MG CAPS Take 1 capsule (10 mg total) by mouth daily. 30 capsule 1  . dexamethasone (DECADRON) 4 MG tablet Take 5 tablets (20 mg total) by mouth once a week. On D22 of each cycle of treatment 20 tablet 5  . docusate sodium (DOK) 100 MG capsule Take 2 capsules (200 mg total) by  mouth at bedtime. 60 capsule 1  . fentaNYL (DURAGESIC) 12 MCG/HR Place 1 patch onto the skin every 3 (three) days. 10 patch 0  . fluticasone (FLONASE) 50 MCG/ACT nasal spray Place 1 spray into both nostrils daily. (Patient taking differently: Place 1  spray into both nostrils daily as needed for allergies or rhinitis. ) 16 g 2  . glipiZIDE (GLUCOTROL XL) 5 MG 24 hr tablet TAKE 1 TABLET(5 MG) BY MOUTH DAILY WITH BREAKFAST 90 tablet 1  . glucose blood (FREESTYLE LITE) test strip CHECK BLOOD SUGAR TWICE DAILY AS DIRECTED Dx 250.00 180 each 3  . Lancets (FREESTYLE) lancets Use twice daily to check sugars. 100 each 11  . lenalidomide (REVLIMID) 15 MG capsule TAKE 1 CAPSULE BY MOUTH  DAILY FOR 21 DAYS ON, THEN  7 DAYS OFF 21 capsule 0  . lidocaine-prilocaine (EMLA) cream APPLY 1 APPLICATION TO THE AFFECTED AREA AS NEEDED. USE PRIOR TO PORT ACCESS 30 g 0  . LORazepam (ATIVAN) 0.5 MG tablet Take 1 tablet (0.5 mg total) by mouth every 8 (eight) hours as needed for anxiety (significant essential tremors). 60 tablet 0  . metFORMIN (GLUCOPHAGE-XR) 500 MG 24 hr tablet TAKE 3 TABLETS(1500 MG) BY MOUTH DAILY WITH BREAKFAST 270 tablet 1  . Multiple Vitamins-Minerals (ICAPS) CAPS Take 1 capsule by mouth daily after breakfast.     . ondansetron (ZOFRAN) 8 MG tablet Take 1 tablet (8 mg total) by mouth 2 (two) times daily as needed (Nausea or vomiting). 30 tablet 1  . Oxycodone HCl 10 MG TABS Take 1 tablet (10 mg total) by mouth every 6 (six) hours as needed. 90 tablet 0  . pantoprazole (PROTONIX) 20 MG tablet TAKE 1 TABLET(20 MG) BY MOUTH DAILY 30 tablet 5  . polyethylene glycol (MIRALAX / GLYCOLAX) packet Take 17 g by mouth daily after breakfast.     . potassium chloride SA (KLOR-CON) 20 MEQ tablet Take 1 tablet (20 mEq total) by mouth 2 (two) times daily. 60 tablet 1  . prochlorperazine (COMPAZINE) 10 MG tablet Take 1 tablet (10 mg total) by mouth every 6 (six) hours as needed (Nausea or vomiting). 30 tablet 1  .  sertraline (ZOLOFT) 50 MG tablet TAKE 1 TABLET BY MOUTH DAILY 90 tablet 0  . simvastatin (ZOCOR) 20 MG tablet TAKE 1 TABLET(20 MG) BY MOUTH DAILY 90 tablet 0  . Triamcinolone Acetonide 0.025 % LOTN Apply 1 application topically 3 (three) times daily as needed (rash/itching). 60 mL 2  . Vitamin D, Ergocalciferol, (DRISDOL) 1.25 MG (50000 UNIT) CAPS capsule TAKE 1 CAPSULE BY MOUTH EVERY 7 DAYS 12 capsule 0   Current Facility-Administered Medications  Medication Dose Route Frequency Provider Last Rate Last Admin  . potassium chloride SA (KLOR-CON) CR tablet 40 mEq  40 mEq Oral Once Brunetta Genera, MD       Facility-Administered Medications Ordered in Other Visits  Medication Dose Route Frequency Provider Last Rate Last Admin  . heparin lock flush 100 unit/mL  500 Units Intracatheter Once PRN Brunetta Genera, MD      . sodium chloride flush (NS) 0.9 % injection 10 mL  10 mL Intracatheter PRN Brunetta Genera, MD   10 mL at 10/02/19 1524    REVIEW OF SYSTEMS:   A 10+ POINT REVIEW OF SYSTEMS WAS OBTAINED including neurology, dermatology, psychiatry, cardiac, respiratory, lymph, extremities, GI, GU, Musculoskeletal, constitutional, breasts, reproductive, HEENT.  All pertinent positives are noted in the HPI.  All others are negative.   PHYSICAL EXAMINATION: ECOG FS:2 - Symptomatic, <50% confined to bed  Vitals:   01/29/20 1449  BP: 129/72  Pulse: 75  Resp: 18  Temp: 99.2 F (37.3 C)  SpO2: 99%   Wt Readings from Last 3 Encounters:  01/29/20 165 lb 4.8  oz (75 kg)  01/15/20 164 lb 8 oz (74.6 kg)  01/01/20 168 lb 4.8 oz (76.3 kg)   Body mass index is 33.39 kg/m.    Exam was given in a chair   GENERAL:alert, in no acute distress and comfortable SKIN: no acute rashes, no significant lesions EYES: conjunctiva are pink and non-injected, sclera anicteric OROPHARYNX: MMM, no exudates, no oropharyngeal erythema or ulceration NECK: supple, no JVD LYMPH:  no palpable  lymphadenopathy in the cervical, axillary or inguinal regions LUNGS: clear to auscultation b/l with normal respiratory effort HEART: regular rate & rhythm ABDOMEN:  normoactive bowel sounds , non tender, not distended. No palpable hepatosplenomegaly.  Extremity: no pedal edema PSYCH: alert & oriented x 3 with fluent speech NEURO: no focal motor/sensory deficits  LABORATORY DATA:  I have reviewed the data as listed  . CBC Latest Ref Rng & Units 01/29/2020 01/15/2020 01/01/2020  WBC 4.0 - 10.5 K/uL 5.0 6.5 5.7  Hemoglobin 12.0 - 15.0 g/dL 12.5 13.5 13.2  Hematocrit 36.0 - 46.0 % 36.5 39.9 38.7  Platelets 150 - 400 K/uL 231 174 254   . CBC    Component Value Date/Time   WBC 5.0 01/29/2020 1342   RBC 3.84 (L) 01/29/2020 1342   HGB 12.5 01/29/2020 1342   HGB 12.8 02/16/2019 1138   HCT 36.5 01/29/2020 1342   PLT 231 01/29/2020 1342   PLT 170 02/16/2019 1138   MCV 95.1 01/29/2020 1342   MCH 32.6 01/29/2020 1342   MCHC 34.2 01/29/2020 1342   RDW 13.7 01/29/2020 1342   LYMPHSABS 1.1 01/29/2020 1342   MONOABS 1.0 01/29/2020 1342   EOSABS 0.1 01/29/2020 1342   BASOSABS 0.1 01/29/2020 1342    . CMP Latest Ref Rng & Units 01/29/2020 01/15/2020 01/01/2020  Glucose 70 - 99 mg/dL 196(H) 182(H) 189(H)  BUN 8 - 23 mg/dL _0 Creatinine 0.44 - 1.00 mg/dL 0.84 0.84 0.81  Sodium 135 - 145 mmol/L 140 138 138  Potassium 3.5 - 5.1 mmol/L 3.1(L) 3.5 3.8  Chloride 98 - 111 mmol/L 107 105 104  CO2 22 - 32 mmol/L _1 Calcium 8.9 - 10.3 mg/dL 9.4 9.4 9.1  Total Protein 6.5 - 8.1 g/dL 6.3(L) 6.4(L) 6.3(L)  Total Bilirubin 0.3 - 1.2 mg/dL 0.7 0.7 0.5  Alkaline Phos 38 - 126 U/L 73 75 75  AST 15 - 41 U/L 13(L) 12(L) 14(L)  ALT 0 - 44 U/L _2 09/18/2019 BM Bx Report ([id number])   09/18/2019 FISH Panel    05/30/2019 BM Bx   01/06/2019 BM Bx:     01/06/19 Cytogenetics:      05/30/19 BM Biopsy:   09/18/2019 FISH Panel    09/18/2019 BM Surgical Pathology  ([id number])     RADIOGRAPHIC STUDIES: I have personally reviewed the radiological images as listed and agreed with the findings in the report. NM PET Image Restage (PS) Whole Body  Result Date: 01/18/2020 CLINICAL DATA:  Subsequent treatment strategy for multiple myeloma. EXAM: NUCLEAR MEDICINE PET WHOLE BODY TECHNIQUE: 8.1 mCi F-18 FDG was injected intravenously. Full-ring PET imaging was performed from the skull base to thigh after the radiotracer. CT data was obtained and used for attenuation correction and anatomic localization. Fasting blood glucose: 120 mg/dl COMPARISON:  PET-CT scan _3 FINDINGS: Mediastinal blood pool activity: SUV max 3.0 Liver activity: 3.6 HEAD/NECK: Hypermetabolic nodule in the LEFT parapharyngeal space adjacent to the styloid process again demonstrated with SUV max equal  11.7 compared SUV max equal 12. 3. A second hypermetabolic nodule in the LEFT level 2 neck, anterior sternocleidomastoid muscle, with SUV max equal 6.9 unchanged from 4.8. Both these nodules are slightly dense (image 48/4 and image 57/4). It Is conceivable the both these nodules could be within parotid tissue and therefore primary parotid neoplasms. Incidental CT findings: none CHEST: No hypermetabolic mediastinal or hilar nodes. No suspicious pulmonary nodules on the CT scan. Resolution of subpleural nodule in the LEFT lower lobe. Band of linear atelectasis in the LEFT lower lobe remains. No suspicious nodularity incidental CT findings: none ABDOMEN/PELVIS: No abnormal hypermetabolic activity within the liver, pancreas, adrenal glands, or spleen. No hypermetabolic lymph nodes in the abdomen or pelvis. Incidental CT findings: Interval relief of the previously seen RIGHT ureteral calculus SKELETON: Again demonstrated lytic lesions which do not have significant radiotracer activity. For example lesion in T9 vertebral body measuring 2 cm (106/4 with SUV max equal 2.6 decreased from 4.5. Example lesion in  the LEFT pubic ramus with SUV max equal 2.0 compared SUV max equal 1.5. No change in size. No new lytic lesions are identified. There are multiple scattered additional smaller lytic lesions are unchanged. Incidental CT findings: There are multiple healed rib fractures of LEFT and RIGHT ribs which do have radiotracer activity but are unchanged from prior favored posttraumatic. EXTREMITIES: No abnormal hypermetabolic activity in the lower extremities. Incidental CT findings: none IMPRESSION: 1. Stable lytic lesions throughout the skeleton. The larger lytic lesions which had mild metabolic activity on comparison exam now have background metabolic activity. No evidence of active myeloma. 2. No evidence of progression multiple myeloma.  No plasmacytoma 3. Hypermetabolic nodules in the LEFT neck may be associated deep tissues of the LEFT parotid gland. 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 Electronically Signed   By: Suzy Bouchard M.D.   On: 01/18/2020 11:55    ASSESSMENT & PLAN:   79 y.o. female with  1. Recently diagnosed Multiple Myeloma, RISS Stage III  Labs upon initial presentation from 12/08/18, blood counts are normal including WBC at 7.1k, HGB at 13.1, and PLT at 245k. Calcium normal at 10.3. Creatinine normal at 0.63. M spike at 0.5g. 12/13/18 Bone Scan revealed Multifocal uptake throughout the skeleton, consistent with diffuse metastatic disease. Primary tumor is not specified. 2. Uptake in the proximal right femur, consistent with lytic lesions. 3. Uptake in the ribs bilaterally as described. 4. Lesions in the proximal left humerus. 5. Diffuse uptake throughout the skull consistent with metastatic disease. 6. Right paramedian uptake at the manubrium.  12/13/18 CT Right Femur revealed Numerous lytic lesions involving the right femur and a lytic lesion in the left inferior pubic ramus. Overall appearance is most concerning for multiple  myeloma  12/27/18 Pretreatment 24hour UPEP observed an M spike at 42m, and showed 1940mtotal protein/day.  12/27/18 Pretreatment MMP revealed M Protein at 0.5g with IgG Lambda specificity. Kappa:Lambda light chain ratio at 0.13, with Lambda at 40.3. There is less abnormal protein and light chains than I would expect from 30% plasma cells, which suggests hypo-secretory or non-secretory neoplastic plasma cells. Will have an impact in assessing response. 01/05/19 PET/CT revealed Innumerable lytic lesions in the skeleton compatible with myeloma. Most of the larger lesions are hypermetabolic, for example including a left proximal humeral shaft lesion with maximum SUV of 8.1 and a 2.8 cm lesion in the left T9 vertebral body with maximum SUV 5.1. Most of the smaller lytic lesions,  and some of the larger lesions, do not demonstrate accentuated metabolic activity. 2. 1.2 cm in short axis lymph node in the left parapharyngeal space is hypermetabolic with maximum SUV 11.8. I do not see a separate mass in the head and neck to give rise to this hypermetabolic lymph node. 3. Mosaic attenuation in the lower lobes, nonspecific possibly from air trapping. 4.  Aortic Atherosclerosis 5. Heterogeneous activity in the liver, making it hard to exclude small liver lesions. Consider hepatic protocol MRI with and without contrast for definitive assessment. Nonobstructive right nephrolithiasis. Old granulomatous disease  01/06/19 Bone Marrow biopsy revealed interstitial increase in plasma cells (28% aspirate, 40% CD138 immunohistochemistry). Plasma cells negative for light chains consistent with a non or weakly secretory myeloma   01/06/19 Cytogenetics revealed 37% of cells with trisomy 11 or 11q deletion, and 40.5% of cells with 17p mutation  S/p 5 cycles of KRD treatment  05/31/19 BM Biopsy revealed mild atypical plasmacytosis at 5% with polytypic variation.   06/01/19 PET/CT revealed "Dominant lesion in the LEFT humerus is  decreased significantly in metabolic activity. Additional hypermetabolic skeletal lytic lesions have decreased in metabolic activity or similar to comparison exam (01/05/2019). No evidence of disease progression. 2. Multiple additional lytic lesions do not have metabolic activity and unchanged. 3. No new skeletal lesions are identified. No soft tissue plasmacytoma identified. 4. Nodule / node in the LEFT parapharyngeal space which is intensely hypermetabolic not changed from prior. 5. New hypermetabolic LEFT lower lobe pulmonary nodule is indeterminate. Recommend close attention on follow-up 6. New obstructive hydronephrosis of the RIGHT kidney related to RIGHT UPJ stone."  09/18/2019 BM Bx Report which revealed "Slightly hypercellular bone marrow for age with trilineage hematopoiesis and 1% plasma cells."  09/14/2019 PET/CT Whole Body Scan ([id number]) which revealed "1. There widespread tiny lytic lesions compatible with multiple myeloma. Index larger lesions are generally similar to the prior exam, with low-grade activity such as the left T9 vertebral body lesion with maximum SUV 4.5. Is mild increase in the activity associated with a mildly sclerotic left proximal humeral lesion, maximum SUV 4.8 (previously 3.5). 2. At the site of the prior left lower lobe nodule is currently more bandlike thickening, with maximum SUV only 1.9, probably benign, continued surveillance of this region suggested. 3. There several small but hypermetabolic lymph nodes. This includes a left parapharyngeal space node measuring 1.0 cm with maximum SUV 12.3 (stable); a left level IB lymph node measuring 0.5 cm with maximum SUV 4.8 (slightly larger than prior); and a left inguinal lymph node measuring 0.7 cm in short axis with maximum SUV 6.4 (previously 0.5 cm with maximum SUV 0.6). Significance of these lymph nodes uncertain, surveillance is recommended. 4. New 5 mm left lower lobe subpleural nodule on image 32/8, not appreciably  hypermetabolic, surveillance suggested. 5. Focal subcutaneous stranding along the left perineum measuring about 2.6 by 1.1 cm on image 221/4, maximum SUV 12.5. This was not present previously and is most likely inflammatory, although given the notable SUV, surveillance of this region is suggested. 6. Other imaging findings of potential clinical significance: Aortic Atherosclerosis (ICD10-I70.0). Coronary atherosclerosis. Old granulomatous disease. Mild right hydronephrosis due to a 7 mm right UPJ calculus. 2 mm right kidney upper pole nonobstructive renal calculus. Prominent stool throughout the colon favors constipation."  12/19/2019 Thoracic & Lumbar Spine MRI ([id number]) ([id number]) revealed "Suspected myeloma lesions at T9 and S1. No compression deformity or epidural disease.  01/18/2020 PET/CT ([id number]) which revealed "1. Stable lytic lesions throughout the skeleton.  The larger lytic lesions which had mild metabolic activity on comparison exam now have background metabolic activity. No evidence of active myeloma. No evidence of progression multiple myeloma.  No plasmacytoma 3. Hypermetabolic nodules in the LEFT neck may be associated deep tissues of the LEFT parotid gland. 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  01/17/19 MRI Liver revealed Several appreciable liver lesions all have benign imaging characteristics. No MRI findings of metastatic involvement of the liver. 2. Scattered bony lesions corresponding to the lytic lesions seen at PET-CT, compatible with active myeloma. 3. Aortic Atherosclerosis.  Mild cardiomegaly. 4. Diffuse hepatic steatosis.   3. Left lower lobe pulmonary nodule First seen on 06/01/19 PET/CT  PLAN: -Discussed pt labwork today, 01/29/20; blood counts look good, low  Potassium, other blood chemistries are stable  -Discussed 01/18/2020 PET/CT ([id number]) which revealed "No evidence of active myeloma. No evidence of progression multiple myeloma." -Advised pt that Revlimid causes some potassium loss  -Recommend pt increase dietary potassium by including tomato juice, coconut water and bananas -Recommend pt receive COVID19 vaccine when available  -Advised pt that she may have less response due to her current immunosuppressive treatment -The pt has not prohibitive toxicities from continuing C13D15 Carfilzomib at this time. -Will cut Carflizomib down to 56 mg/m^2 -Recommend pt continue to take [phone] mg of Tylenol post-treatment to reduce symptoms of Carfilzomib related fever/chills. -Continue current dose of Revlimd -Rx Potassium -Will see back in 4 weeks with labs   FOLLOW UP: Please schedule next 4 doses of maintenance Carfilzomib with labs and portflush. MD visit every 4 weeks  The total time spent in the appt was 30 minutes and more than 50% was on counseling and direct patient cares.  All of the patient's questions were answered with apparent satisfaction. The patient knows to call the clinic with any problems, questions or concerns.    Sullivan Lone MD [place] AAHIVMS [facility] Hematology/Oncology Physician [facility]  (Office):       [phone] (Work cell):  [phone] (Fax):           [phone]  01/29/2020 3:22 PM  I, Yevette Edwards, am acting as a scribe for Dr. Sullivan Lone.   .I have reviewed the above documentation for accuracy and completeness, and I agree with the above. Brunetta Genera MD

## 2020-01-29 NOTE — Patient Instructions (Signed)
[facility] Discharge Instructions for Patients Receiving Chemotherapy  Today you received the following chemotherapy agents:  Kyprolis  To help prevent nausea and vomiting after your treatment, we encourage you to take your nausea medication as prescribed.   If you develop nausea and vomiting that is not controlled by your nausea medication, call the clinic.   BELOW ARE SYMPTOMS THAT SHOULD BE REPORTED IMMEDIATELY:  *FEVER GREATER THAN 100.5 F  *CHILLS WITH OR WITHOUT FEVER  NAUSEA AND VOMITING THAT IS NOT CONTROLLED WITH YOUR NAUSEA MEDICATION  *UNUSUAL SHORTNESS OF BREATH  *UNUSUAL BRUISING OR BLEEDING  TENDERNESS IN MOUTH AND THROAT WITH OR WITHOUT PRESENCE OF ULCERS  *URINARY PROBLEMS  *BOWEL PROBLEMS  UNUSUAL RASH Items with * indicate a potential emergency and should be followed up as soon as possible.  Feel free to call the clinic should you have any questions or concerns. The clinic phone number is [phone].  Please show the [place] at check-in to the Emergency Department and triage nurse.  Zoledronic Acid injection (Hypercalcemia, Oncology) What is this medicine? ZOLEDRONIC ACID (ZOE le dron ik AS id) lowers the amount of calcium loss from bone. It is used to treat too much calcium in your blood from cancer. It is also used to prevent complications of cancer that has spread to the bone. This medicine may be used for other purposes; ask your health care provider or pharmacist if you have questions. COMMON BRAND NAME(S): Zometa What should I tell my health care provider before I take this medicine? They need to know if you have any of these conditions:  aspirin-sensitive asthma  cancer, especially if you are receiving medicines used to treat cancer  dental disease or wear dentures  infection  kidney disease  receiving corticosteroids like dexamethasone or prednisone  an unusual or allergic reaction to zoledronic acid, other  medicines, foods, dyes, or preservatives  pregnant or trying to get pregnant  breast-feeding How should I use this medicine? This medicine is for infusion into a vein. It is given by a health care professional in a hospital or clinic setting. Talk to your pediatrician regarding the use of this medicine in children. Special care may be needed. Overdosage: If you think you have taken too much of this medicine contact a poison control center or emergency room at once. NOTE: This medicine is only for you. Do not share this medicine with others. What if I miss a dose? It is important not to miss your dose. Call your doctor or health care professional if you are unable to keep an appointment. What may interact with this medicine?  certain antibiotics given by injection  NSAIDs, medicines for pain and inflammation, like ibuprofen or naproxen  some diuretics like bumetanide, furosemide  teriparatide  thalidomide This list may not describe all possible interactions. Give your health care provider a list of all the medicines, herbs, non-prescription drugs, or dietary supplements you use. Also tell them if you smoke, drink alcohol, or use illegal drugs. Some items may interact with your medicine. What should I watch for while using this medicine? Visit your doctor or health care professional for regular checkups. It may be some time before you see the benefit from this medicine. Do not stop taking your medicine unless your doctor tells you to. Your doctor may order blood tests or other tests to see how you are doing. Women should inform their doctor if they wish to become pregnant or think they might be  pregnant. There is a potential for serious side effects to an unborn child. Talk to your health care professional or pharmacist for more information. You should make sure that you get enough calcium and vitamin D while you are taking this medicine. Discuss the foods you eat and the vitamins you take  with your health care professional. Some people who take this medicine have severe bone, joint, and/or muscle pain. This medicine may also increase your risk for jaw problems or a broken thigh bone. Tell your doctor right away if you have severe pain in your jaw, bones, joints, or muscles. Tell your doctor if you have any pain that does not go away or that gets worse. Tell your dentist and dental surgeon that you are taking this medicine. You should not have major dental surgery while on this medicine. See your dentist to have a dental exam and fix any dental problems before starting this medicine. Take good care of your teeth while on this medicine. Make sure you see your dentist for regular follow-up appointments. What side effects may I notice from receiving this medicine? Side effects that you should report to your doctor or health care professional as soon as possible:  allergic reactions like skin rash, itching or hives, swelling of the face, lips, or tongue  anxiety, confusion, or depression  breathing problems  changes in vision  eye pain  feeling faint or lightheaded, falls  jaw pain, especially after dental work  mouth sores  muscle cramps, stiffness, or weakness  redness, blistering, peeling or loosening of the skin, including inside the mouth  trouble passing urine or change in the amount of urine Side effects that usually do not require medical attention (report to your doctor or health care professional if they continue or are bothersome):  bone, joint, or muscle pain  constipation  diarrhea  fever  hair loss  irritation at site where injected  loss of appetite  nausea, vomiting  stomach upset  trouble sleeping  trouble swallowing  weak or tired This list may not describe all possible side effects. Call your doctor for medical advice about side effects. You may report side effects to FDA at 1-800-FDA-1088. Where should I keep my medicine? This drug  is given in a hospital or clinic and will not be stored at home. NOTE: This sheet is a summary. It may not cover all possible information. If you have questions about this medicine, talk to your doctor, pharmacist, or health care provider.  2020 Elsevier/Gold Standard (2014-04-28 14:19:39)   Hypokalemia Hypokalemia means that the amount of potassium in the blood is lower than normal. Potassium is a chemical (electrolyte) that helps regulate the amount of fluid in the body. It also stimulates muscle tightening (contraction) and helps nerves work properly. Normally, most of the body's potassium is inside cells, and only a very small amount is in the blood. Because the amount in the blood is so small, minor changes to potassium levels in the blood can be life-threatening. What are the causes? This condition may be caused by:  Antibiotic medicine.  Diarrhea or vomiting. Taking too much of a medicine that helps you have a bowel movement (laxative) can cause diarrhea and lead to hypokalemia.  Chronic kidney disease (CKD).  Medicines that help the body get rid of excess fluid (diuretics).  Eating disorders, such as bulimia.  Low magnesium levels in the body.  Sweating a lot. What are the signs or symptoms? Symptoms of this condition include:  Weakness.  Constipation.  Fatigue.  Muscle cramps.  Mental confusion.  Skipped heartbeats or irregular heartbeat (palpitations).  Tingling or numbness. How is this diagnosed? This condition is diagnosed with a blood test. How is this treated? This condition may be treated by:  Taking potassium supplements by mouth.  Adjusting the medicines that you take.  Eating more foods that contain a lot of potassium. If your potassium level is very low, you may need to get potassium through an IV and be monitored in the hospital. Follow these instructions at home:   Take over-the-counter and prescription medicines only as told by your health  care provider. This includes vitamins and supplements.  Eat a healthy diet. A healthy diet includes fresh fruits and vegetables, whole grains, healthy fats, and lean proteins.  If instructed, eat more foods that contain a lot of potassium. This includes: ? Nuts, such as peanuts and pistachios. ? Seeds, such as sunflower seeds and pumpkin seeds. ? Peas, lentils, and lima beans. ? Whole grain and bran cereals and breads. ? Fresh fruits and vegetables, such as apricots, avocado, bananas, cantaloupe, kiwi, oranges, tomatoes, asparagus, and potatoes. ? Orange juice. ? Tomato juice. ? Red meats. ? Yogurt.  Keep all follow-up visits as told by your health care provider. This is important. Contact a health care provider if you:  Have weakness that gets worse.  Feel your heart pounding or racing.  Vomit.  Have diarrhea.  Have diabetes (diabetes mellitus) and you have trouble keeping your blood sugar (glucose) in your target range. Get help right away if you:  Have chest pain.  Have shortness of breath.  Have vomiting or diarrhea that lasts for more than 2 days.  Faint. Summary  Hypokalemia means that the amount of potassium in the blood is lower than normal.  This condition is diagnosed with a blood test.  Hypokalemia may be treated by taking potassium supplements, adjusting the medicines that you take, or eating more foods that are high in potassium.  If your potassium level is very low, you may need to get potassium through an IV and be monitored in the hospital. This information is not intended to replace advice given to you by your health care provider. Make sure you discuss any questions you have with your health care provider. Document Revised: 07/13/2018 Document Reviewed: 07/13/2018 Elsevier Patient Education  [place].

## 2020-01-31 ENCOUNTER — Telehealth: Payer: Self-pay | Admitting: Hematology

## 2020-01-31 NOTE — Telephone Encounter (Signed)
Scheduled per 02/15 los, patient has been called and voicemail has been left.

## 2020-02-06 ENCOUNTER — Other Ambulatory Visit: Payer: Self-pay | Admitting: *Deleted

## 2020-02-06 ENCOUNTER — Encounter: Payer: Self-pay | Admitting: Hematology

## 2020-02-06 DIAGNOSIS — C9000 Multiple myeloma not having achieved remission: Secondary | ICD-10-CM

## 2020-02-06 MED ORDER — FENTANYL 12 MCG/HR TD PT72: 1.0000 | MEDICATED_PATCH | TRANSDERMAL | 0 refills | Status: DC

## 2020-02-06 NOTE — Telephone Encounter (Signed)
Patient requested refill for Fentanyl patches

## 2020-02-09 ENCOUNTER — Encounter: Payer: Self-pay | Admitting: Internal Medicine

## 2020-02-09 ENCOUNTER — Ambulatory Visit: Payer: Medicare PPO | Attending: Internal Medicine

## 2020-02-09 ENCOUNTER — Ambulatory Visit (INDEPENDENT_AMBULATORY_CARE_PROVIDER_SITE_OTHER): Payer: Medicare PPO | Admitting: Internal Medicine

## 2020-02-09 DIAGNOSIS — E118 Type 2 diabetes mellitus with unspecified complications: Secondary | ICD-10-CM | POA: Diagnosis not present

## 2020-02-09 DIAGNOSIS — Z23 Encounter for immunization: Secondary | ICD-10-CM | POA: Insufficient documentation

## 2020-02-09 NOTE — Assessment & Plan Note (Signed)
Will ask her oncologist to add HgA1c to one of her next blood draws and adjust her medications accordingly.

## 2020-02-09 NOTE — Progress Notes (Signed)
Virtual Visit via Video Note  I connected with Faith Orr on 02/09/20 at  3:00 PM EST by a video enabled telemedicine application and verified that I am speaking with the correct person using two identifiers.  The patient and the provider were at separate locations throughout the entire encounter.   I discussed the limitations of evaluation and management by telemedicine and the availability of in person appointments. The patient expressed understanding and agreed to proceed. The patient and the provider were the only parties present for the visit unless noted in HPI below.  History of Present Illness: The patient is a 79 y.o. female with visit for follow up diabetes. Overall it is stable, glipizide helping keep sugars down. Has tried to keep diet decent. In maintenance phase of her chemotherapy and first covid-19 shot today.   Observations/Objective: Appearance: normal, breathing appears normal, casual grooming, abdomen does not appear distended, memory normal, mental status is A and O times 3  Assessment and Plan: See problem oriented charting  Follow Up Instructions: will ask oncologist to check HgA1c and forward to [name] in the near future.   I discussed the assessment and treatment plan with the patient. The patient was provided an opportunity to ask questions and all were answered. The patient agreed with the plan and demonstrated an understanding of the instructions.   The patient was advised to call back or seek an in-person evaluation if the symptoms worsen or if the condition fails to improve as anticipated.  Hoyt Koch, MD

## 2020-02-09 NOTE — Progress Notes (Signed)
   Covid-19 Vaccination Clinic  Name:  Faith Orr    MRN: [id number] DOB: 09/10/1941  02/09/2020  [name] was observed post Covid-19 immunization for 30 minutes based on pre-vaccination screening without incidence. She was provided with Vaccine Information Sheet and instruction to access the V-Safe system.   [name] was instructed to call 911 with any severe reactions post vaccine: Marland Kitchen Difficulty breathing  . Swelling of your face and throat  . A fast heartbeat  . A bad rash all over your body  . Dizziness and weakness    Immunizations Administered    Name Date Dose VIS Date Route   Pfizer COVID-19 Vaccine 02/09/2020  9:19 AM 0.3 mL 11/24/2019 Intramuscular   Manufacturer: [place]   Lot: [id number]   [place]: [id number]

## 2020-02-12 ENCOUNTER — Inpatient Hospital Stay: Payer: Medicare PPO | Admitting: Hematology

## 2020-02-12 ENCOUNTER — Other Ambulatory Visit: Payer: Self-pay

## 2020-02-12 ENCOUNTER — Inpatient Hospital Stay: Payer: Medicare PPO

## 2020-02-12 ENCOUNTER — Telehealth: Payer: Self-pay | Admitting: Hematology

## 2020-02-12 ENCOUNTER — Inpatient Hospital Stay: Payer: Medicare PPO | Attending: Hematology

## 2020-02-12 VITALS — BP 116/69 | HR 72 | Temp 97.8°F | Resp 17

## 2020-02-12 DIAGNOSIS — Z95828 Presence of other vascular implants and grafts: Secondary | ICD-10-CM

## 2020-02-12 DIAGNOSIS — C9000 Multiple myeloma not having achieved remission: Secondary | ICD-10-CM

## 2020-02-12 DIAGNOSIS — Z5112 Encounter for antineoplastic immunotherapy: Secondary | ICD-10-CM | POA: Diagnosis not present

## 2020-02-12 DIAGNOSIS — C7951 Secondary malignant neoplasm of bone: Secondary | ICD-10-CM

## 2020-02-12 DIAGNOSIS — Z7189 Other specified counseling: Secondary | ICD-10-CM

## 2020-02-12 DIAGNOSIS — Z5111 Encounter for antineoplastic chemotherapy: Secondary | ICD-10-CM

## 2020-02-12 LAB — CBC WITH DIFFERENTIAL/PLATELET
Abs Immature Granulocytes: 0.03 10*3/uL (ref 0.00–0.07)
Basophils Absolute: 0.1 10*3/uL (ref 0.0–0.1)
Basophils Relative: 1 %
Eosinophils Absolute: 0.3 10*3/uL (ref 0.0–0.5)
Eosinophils Relative: 6 %
HCT: 37.3 % (ref 36.0–46.0)
Hemoglobin: 12.5 g/dL (ref 12.0–15.0)
Immature Granulocytes: 1 %
Lymphocytes Relative: 20 %
Lymphs Abs: 1.1 10*3/uL (ref 0.7–4.0)
MCH: 32.8 pg (ref 26.0–34.0)
MCHC: 33.5 g/dL (ref 30.0–36.0)
MCV: 97.9 fL (ref 80.0–100.0)
Monocytes Absolute: 1.3 10*3/uL — ABNORMAL HIGH (ref 0.1–1.0)
Monocytes Relative: 22 %
Neutro Abs: 3 10*3/uL (ref 1.7–7.7)
Neutrophils Relative %: 50 %
Platelets: 175 10*3/uL (ref 150–400)
RBC: 3.81 MIL/uL — ABNORMAL LOW (ref 3.87–5.11)
RDW: 13.5 % (ref 11.5–15.5)
WBC: 5.8 10*3/uL (ref 4.0–10.5)
nRBC: 0 % (ref 0.0–0.2)

## 2020-02-12 LAB — CMP (CANCER CENTER ONLY)
ALT: 17 U/L (ref 0–44)
AST: 13 U/L — ABNORMAL LOW (ref 15–41)
Albumin: 3.3 g/dL — ABNORMAL LOW (ref 3.5–5.0)
Alkaline Phosphatase: 72 U/L (ref 38–126)
Anion gap: 8 (ref 5–15)
BUN: 10 mg/dL (ref 8–23)
CO2: 23 mmol/L (ref 22–32)
Calcium: 9.1 mg/dL (ref 8.9–10.3)
Chloride: 108 mmol/L (ref 98–111)
Creatinine: 0.83 mg/dL (ref 0.44–1.00)
GFR, Est AFR Am: >60 mL/min (ref >60)
GFR, Estimated: >60 mL/min (ref >60)
Glucose, Bld: 189 mg/dL — ABNORMAL HIGH (ref 70–99)
Potassium: 3.7 mmol/L (ref 3.5–5.1)
Sodium: 139 mmol/L (ref 135–145)
Total Bilirubin: 0.7 mg/dL (ref 0.3–1.2)
Total Protein: 6.1 g/dL — ABNORMAL LOW (ref 6.5–8.1)

## 2020-02-12 MED ORDER — FAMOTIDINE 20 MG PO TABS
20.0000 mg | ORAL_TABLET | Freq: Once | ORAL | Status: AC
  Administered 2020-02-12: 20 mg via ORAL

## 2020-02-12 MED ORDER — SODIUM CHLORIDE 0.9% FLUSH
10.0000 mL | Freq: Once | INTRAVENOUS | Status: AC
  Administered 2020-02-12: 13:00:00 10 mL
  Filled 2020-02-12: qty 10

## 2020-02-12 MED ORDER — ZOLEDRONIC ACID 4 MG/100ML IV SOLN
4.0000 mg | Freq: Once | INTRAVENOUS | Status: AC
  Administered 2020-02-12: 4 mg via INTRAVENOUS

## 2020-02-12 MED ORDER — SODIUM CHLORIDE 0.9 % IV SOLN
Freq: Once | INTRAVENOUS | Status: AC
  Filled 2020-02-12: qty 250

## 2020-02-12 MED ORDER — PROCHLORPERAZINE MALEATE 10 MG PO TABS
ORAL_TABLET | ORAL | Status: AC
  Filled 2020-02-12: qty 1

## 2020-02-12 MED ORDER — ZOLEDRONIC ACID 4 MG/100ML IV SOLN
INTRAVENOUS | Status: AC
  Filled 2020-02-12: qty 100

## 2020-02-12 MED ORDER — DEXTROSE 5 % IV SOLN
55.0000 mg/m2 | Freq: Once | INTRAVENOUS | Status: AC
  Administered 2020-02-12: 100 mg via INTRAVENOUS
  Filled 2020-02-12: qty 30

## 2020-02-12 MED ORDER — DIPHENHYDRAMINE HCL 25 MG PO CAPS
ORAL_CAPSULE | ORAL | Status: AC
  Filled 2020-02-12: qty 1

## 2020-02-12 MED ORDER — HEPARIN SOD (PORK) LOCK FLUSH 100 UNIT/ML IV SOLN
500.0000 [IU] | Freq: Once | INTRAVENOUS | Status: AC | PRN
  Administered 2020-02-12: 500 [IU]
  Filled 2020-02-12: qty 5

## 2020-02-12 MED ORDER — ACETAMINOPHEN 325 MG PO TABS
650.0000 mg | ORAL_TABLET | Freq: Once | ORAL | Status: AC
  Administered 2020-02-12: 650 mg via ORAL

## 2020-02-12 MED ORDER — DEXAMETHASONE 4 MG PO TABS
ORAL_TABLET | ORAL | Status: AC
  Filled 2020-02-12: qty 5

## 2020-02-12 MED ORDER — SODIUM CHLORIDE 0.9% FLUSH
10.0000 mL | INTRAVENOUS | Status: DC | PRN
  Administered 2020-02-12: 10 mL
  Filled 2020-02-12: qty 10

## 2020-02-12 MED ORDER — DEXAMETHASONE 4 MG PO TABS
20.0000 mg | ORAL_TABLET | Freq: Once | ORAL | Status: AC
  Administered 2020-02-12: 20 mg via ORAL

## 2020-02-12 MED ORDER — FAMOTIDINE 20 MG PO TABS
ORAL_TABLET | ORAL | Status: AC
  Filled 2020-02-12: qty 1

## 2020-02-12 MED ORDER — PROCHLORPERAZINE MALEATE 10 MG PO TABS
10.0000 mg | ORAL_TABLET | Freq: Once | ORAL | Status: AC
  Administered 2020-02-12: 10 mg via ORAL

## 2020-02-12 MED ORDER — DIPHENHYDRAMINE HCL 25 MG PO TABS
25.0000 mg | ORAL_TABLET | Freq: Once | ORAL | Status: AC
  Administered 2020-02-12: 25 mg via ORAL
  Filled 2020-02-12: qty 1

## 2020-02-12 MED ORDER — ACETAMINOPHEN 325 MG PO TABS
ORAL_TABLET | ORAL | Status: AC
  Filled 2020-02-12: qty 2

## 2020-02-12 NOTE — Telephone Encounter (Signed)
Rescheduled a few appts to later times of the day per patient request. Gave new printout

## 2020-02-12 NOTE — Patient Instructions (Signed)
[facility] Discharge Instructions for Patients Receiving Chemotherapy  Today you received the following chemotherapy agents Kyprolis  To help prevent nausea and vomiting after your treatment, we encourage you to take your nausea medication as directed.    If you develop nausea and vomiting that is not controlled by your nausea medication, call the clinic.   BELOW ARE SYMPTOMS THAT SHOULD BE REPORTED IMMEDIATELY:  *FEVER GREATER THAN 100.5 F  *CHILLS WITH OR WITHOUT FEVER  NAUSEA AND VOMITING THAT IS NOT CONTROLLED WITH YOUR NAUSEA MEDICATION  *UNUSUAL SHORTNESS OF BREATH  *UNUSUAL BRUISING OR BLEEDING  TENDERNESS IN MOUTH AND THROAT WITH OR WITHOUT PRESENCE OF ULCERS  *URINARY PROBLEMS  *BOWEL PROBLEMS  UNUSUAL RASH Items with * indicate a potential emergency and should be followed up as soon as possible.  Feel free to call the clinic should you have any questions or concerns. The clinic phone number is [phone].  Please show the CHEMO ALERT CARD at check-in to the Emergency Department and triage nurse.  Zoledronic Acid injection (Hypercalcemia, Oncology) What is this medicine? ZOLEDRONIC ACID (ZOE le dron ik AS id) lowers the amount of calcium loss from bone. It is used to treat too much calcium in your blood from cancer. It is also used to prevent complications of cancer that has spread to the bone. This medicine may be used for other purposes; ask your health care provider or pharmacist if you have questions. COMMON BRAND NAME(S): Zometa What should I tell my health care provider before I take this medicine? They need to know if you have any of these conditions:  aspirin-sensitive asthma  cancer, especially if you are receiving medicines used to treat cancer  dental disease or wear dentures  infection  kidney disease  receiving corticosteroids like dexamethasone or prednisone  an unusual or allergic reaction to zoledronic acid, other  medicines, foods, dyes, or preservatives  pregnant or trying to get pregnant  breast-feeding How should I use this medicine? This medicine is for infusion into a vein. It is given by a health care professional in a hospital or clinic setting. Talk to your pediatrician regarding the use of this medicine in children. Special care may be needed. Overdosage: If you think you have taken too much of this medicine contact a poison control center or emergency room at once. NOTE: This medicine is only for you. Do not share this medicine with others. What if I miss a dose? It is important not to miss your dose. Call your doctor or health care professional if you are unable to keep an appointment. What may interact with this medicine?  certain antibiotics given by injection  NSAIDs, medicines for pain and inflammation, like ibuprofen or naproxen  some diuretics like bumetanide, furosemide  teriparatide  thalidomide This list may not describe all possible interactions. Give your health care provider a list of all the medicines, herbs, non-prescription drugs, or dietary supplements you use. Also tell them if you smoke, drink alcohol, or use illegal drugs. Some items may interact with your medicine. What should I watch for while using this medicine? Visit your doctor or health care professional for regular checkups. It may be some time before you see the benefit from this medicine. Do not stop taking your medicine unless your doctor tells you to. Your doctor may order blood tests or other tests to see how you are doing. Women should inform their doctor if they wish to become pregnant or think they might be   pregnant. There is a potential for serious side effects to an unborn child. Talk to your health care professional or pharmacist for more information. You should make sure that you get enough calcium and vitamin D while you are taking this medicine. Discuss the foods you eat and the vitamins you take  with your health care professional. Some people who take this medicine have severe bone, joint, and/or muscle pain. This medicine may also increase your risk for jaw problems or a broken thigh bone. Tell your doctor right away if you have severe pain in your jaw, bones, joints, or muscles. Tell your doctor if you have any pain that does not go away or that gets worse. Tell your dentist and dental surgeon that you are taking this medicine. You should not have major dental surgery while on this medicine. See your dentist to have a dental exam and fix any dental problems before starting this medicine. Take good care of your teeth while on this medicine. Make sure you see your dentist for regular follow-up appointments. What side effects may I notice from receiving this medicine? Side effects that you should report to your doctor or health care professional as soon as possible:  allergic reactions like skin rash, itching or hives, swelling of the face, lips, or tongue  anxiety, confusion, or depression  breathing problems  changes in vision  eye pain  feeling faint or lightheaded, falls  jaw pain, especially after dental work  mouth sores  muscle cramps, stiffness, or weakness  redness, blistering, peeling or loosening of the skin, including inside the mouth  trouble passing urine or change in the amount of urine Side effects that usually do not require medical attention (report to your doctor or health care professional if they continue or are bothersome):  bone, joint, or muscle pain  constipation  diarrhea  fever  hair loss  irritation at site where injected  loss of appetite  nausea, vomiting  stomach upset  trouble sleeping  trouble swallowing  weak or tired This list may not describe all possible side effects. Call your doctor for medical advice about side effects. You may report side effects to FDA at 1-800-FDA-1088. Where should I keep my medicine? This drug  is given in a hospital or clinic and will not be stored at home. NOTE: This sheet is a summary. It may not cover all possible information. If you have questions about this medicine, talk to your doctor, pharmacist, or health care provider.  2020 Elsevier/Gold Standard (2014-04-28 14:19:39)  

## 2020-02-13 ENCOUNTER — Other Ambulatory Visit: Payer: Self-pay | Admitting: Hematology

## 2020-02-13 DIAGNOSIS — C9000 Multiple myeloma not having achieved remission: Secondary | ICD-10-CM

## 2020-02-20 ENCOUNTER — Encounter: Payer: Self-pay | Admitting: Hematology

## 2020-02-26 ENCOUNTER — Inpatient Hospital Stay: Payer: Medicare PPO

## 2020-02-26 ENCOUNTER — Inpatient Hospital Stay (HOSPITAL_BASED_OUTPATIENT_CLINIC_OR_DEPARTMENT_OTHER): Payer: Medicare PPO | Admitting: Hematology

## 2020-02-26 ENCOUNTER — Other Ambulatory Visit: Payer: Self-pay

## 2020-02-26 ENCOUNTER — Encounter: Payer: Self-pay | Admitting: Internal Medicine

## 2020-02-26 VITALS — BP 133/66 | HR 81 | Temp 97.8°F | Resp 18 | Ht 59.0 in | Wt 163.6 lb

## 2020-02-26 DIAGNOSIS — Z5111 Encounter for antineoplastic chemotherapy: Secondary | ICD-10-CM

## 2020-02-26 DIAGNOSIS — C9000 Multiple myeloma not having achieved remission: Secondary | ICD-10-CM

## 2020-02-26 DIAGNOSIS — Z95828 Presence of other vascular implants and grafts: Secondary | ICD-10-CM

## 2020-02-26 DIAGNOSIS — C7951 Secondary malignant neoplasm of bone: Secondary | ICD-10-CM

## 2020-02-26 DIAGNOSIS — Z7189 Other specified counseling: Secondary | ICD-10-CM

## 2020-02-26 DIAGNOSIS — Z5112 Encounter for antineoplastic immunotherapy: Secondary | ICD-10-CM | POA: Diagnosis not present

## 2020-02-26 LAB — CMP (CANCER CENTER ONLY)
ALT: 15 U/L (ref 0–44)
AST: 14 U/L — ABNORMAL LOW (ref 15–41)
Albumin: 3.3 g/dL — ABNORMAL LOW (ref 3.5–5.0)
Alkaline Phosphatase: 73 U/L (ref 38–126)
Anion gap: 11 (ref 5–15)
BUN: 12 mg/dL (ref 8–23)
CO2: 20 mmol/L — ABNORMAL LOW (ref 22–32)
Calcium: 8.8 mg/dL — ABNORMAL LOW (ref 8.9–10.3)
Chloride: 110 mmol/L (ref 98–111)
Creatinine: 0.89 mg/dL (ref 0.44–1.00)
GFR, Est AFR Am: >60 mL/min (ref >60)
GFR, Estimated: >60 mL/min (ref >60)
Glucose, Bld: 222 mg/dL — ABNORMAL HIGH (ref 70–99)
Potassium: 3.6 mmol/L (ref 3.5–5.1)
Sodium: 141 mmol/L (ref 135–145)
Total Bilirubin: 0.6 mg/dL (ref 0.3–1.2)
Total Protein: 6.3 g/dL — ABNORMAL LOW (ref 6.5–8.1)

## 2020-02-26 LAB — CBC WITH DIFFERENTIAL/PLATELET
Abs Immature Granulocytes: 0.02 10*3/uL (ref 0.00–0.07)
Basophils Absolute: 0.1 10*3/uL (ref 0.0–0.1)
Basophils Relative: 2 %
Eosinophils Absolute: 0.1 10*3/uL (ref 0.0–0.5)
Eosinophils Relative: 2 %
HCT: 37.2 % (ref 36.0–46.0)
Hemoglobin: 12.6 g/dL (ref 12.0–15.0)
Immature Granulocytes: 0 %
Lymphocytes Relative: 22 %
Lymphs Abs: 1.2 10*3/uL (ref 0.7–4.0)
MCH: 33.4 pg (ref 26.0–34.0)
MCHC: 33.9 g/dL (ref 30.0–36.0)
MCV: 98.7 fL (ref 80.0–100.0)
Monocytes Absolute: 1 10*3/uL (ref 0.1–1.0)
Monocytes Relative: 19 %
Neutro Abs: 3 10*3/uL (ref 1.7–7.7)
Neutrophils Relative %: 55 %
Platelets: 223 10*3/uL (ref 150–400)
RBC: 3.77 MIL/uL — ABNORMAL LOW (ref 3.87–5.11)
RDW: 13.4 % (ref 11.5–15.5)
WBC: 5.4 10*3/uL (ref 4.0–10.5)
nRBC: 0 % (ref 0.0–0.2)

## 2020-02-26 MED ORDER — FAMOTIDINE 20 MG PO TABS
ORAL_TABLET | ORAL | Status: AC
  Filled 2020-02-26: qty 1

## 2020-02-26 MED ORDER — DEXAMETHASONE 4 MG PO TABS
20.0000 mg | ORAL_TABLET | Freq: Once | ORAL | Status: AC
  Administered 2020-02-26: 20 mg via ORAL

## 2020-02-26 MED ORDER — PROCHLORPERAZINE MALEATE 10 MG PO TABS
10.0000 mg | ORAL_TABLET | Freq: Once | ORAL | Status: AC
  Administered 2020-02-26: 10 mg via ORAL

## 2020-02-26 MED ORDER — SODIUM CHLORIDE 0.9 % IV SOLN
Freq: Once | INTRAVENOUS | Status: AC
  Filled 2020-02-26: qty 250

## 2020-02-26 MED ORDER — ACETAMINOPHEN 325 MG PO TABS
650.0000 mg | ORAL_TABLET | Freq: Once | ORAL | Status: AC
  Administered 2020-02-26: 650 mg via ORAL

## 2020-02-26 MED ORDER — DEXAMETHASONE 4 MG PO TABS
ORAL_TABLET | ORAL | Status: AC
  Filled 2020-02-26: qty 5

## 2020-02-26 MED ORDER — FAMOTIDINE 20 MG PO TABS
20.0000 mg | ORAL_TABLET | Freq: Once | ORAL | Status: AC
  Administered 2020-02-26: 20 mg via ORAL

## 2020-02-26 MED ORDER — SODIUM CHLORIDE 0.9% FLUSH
10.0000 mL | INTRAVENOUS | Status: DC | PRN
  Administered 2020-02-26: 10 mL
  Filled 2020-02-26: qty 10

## 2020-02-26 MED ORDER — ACETAMINOPHEN 325 MG PO TABS
ORAL_TABLET | ORAL | Status: AC
  Filled 2020-02-26: qty 2

## 2020-02-26 MED ORDER — PROCHLORPERAZINE MALEATE 10 MG PO TABS
ORAL_TABLET | ORAL | Status: AC
  Filled 2020-02-26: qty 1

## 2020-02-26 MED ORDER — DEXTROSE 5 % IV SOLN
100.0000 mg | Freq: Once | INTRAVENOUS | Status: AC
  Administered 2020-02-26: 100 mg via INTRAVENOUS
  Filled 2020-02-26: qty 15

## 2020-02-26 MED ORDER — DIPHENHYDRAMINE HCL 25 MG PO TABS
25.0000 mg | ORAL_TABLET | Freq: Once | ORAL | Status: AC
  Administered 2020-02-26: 25 mg via ORAL
  Filled 2020-02-26: qty 1

## 2020-02-26 MED ORDER — DIPHENHYDRAMINE HCL 25 MG PO CAPS
ORAL_CAPSULE | ORAL | Status: AC
  Filled 2020-02-26: qty 1

## 2020-02-26 MED ORDER — HEPARIN SOD (PORK) LOCK FLUSH 100 UNIT/ML IV SOLN
500.0000 [IU] | Freq: Once | INTRAVENOUS | Status: AC | PRN
  Administered 2020-02-26: 500 [IU]
  Filled 2020-02-26: qty 5

## 2020-02-26 MED ORDER — SODIUM CHLORIDE 0.9% FLUSH
10.0000 mL | Freq: Once | INTRAVENOUS | Status: AC
  Administered 2020-02-26: 10 mL
  Filled 2020-02-26: qty 10

## 2020-02-26 NOTE — Progress Notes (Signed)
HEMATOLOGY/ONCOLOGY CLINIC NOTE  Date of Service: 02/26/2020  Patient Care Team: Hoyt Koch, MD as PCP - General (Internal Medicine) Lafayette Dragon, MD (Inactive) as Consulting Physician (Gastroenterology) Megan Salon, MD as Consulting Physician (Gynecology) Melrose Nakayama, MD as Consulting Physician (Orthopedic Surgery) Deneise Lever, MD as Consulting Physician (Pulmonary Disease) [name], MD (Ophthalmology)  CHIEF COMPLAINTS/PURPOSE OF CONSULTATION:  Continue mx of myeloma  HISTORY OF PRESENTING ILLNESS:   Faith Orr is a wonderful 79 y.o. female who has been referred to [name] by Dr. Pricilla Holm for evaluation and management of Lytic bone lesions. She is accompanied today by her son in law, and her partner is present via phone. The pt reports that she is doing well overall.   The pt notes that 2-3 months ago while standing at the stove, and otherwise feeling normally, she felt "something snap that took her breath away" in her mid back as she stretched to get something. The pt notes that she saw a chiropractor twice due to her back stiffness, which did not help. The pt then described her new bone pains to her PCP on 12/05/18, and subsequent imaging, as noted below, revealed concerns for numerous bone lesions. She has begun 54m Fosamax. The pt notes that she had hip pain in 2018, and that an XR at that time did not reveal any lesions.  The pt notes that most of her pain is concentrated to her left shoulder presently, and with minimal movement of the arm. She endorses pain radiating into her left arm and notes that her hand is swollen in the mornings when she wakes up. She also endorses present back pain and right hip pain, worse when she walks. She has not yet seen orthopedics. The pt reports that she is needing to take 609mAdvil every 4-6 hours as Tramadol alone has not been able to alleviate her pain. The pt denies any unexpected weight loss, fevers,  chills, or night sweats. The pt notes that her urine is a very dark color presently, but denies overt blood in the urine, underpants, nor tissue paper. The pt notes that in the last two weeks she has had some soreness in her head, but denies new headaches or changes in vision. The pt notes that she has been urinating more frequently overall, but has been trying to stay better hydrated as well.  The pt notes that she has been compliant with annual mammograms.   The pt notes that she had a cyst in her right breast which was removed in the past. She fractured her left wrist in 2008 after falling down stairs. The pt endorses history of fatty liver.   The pt denies ever smoking cigarettes and endorses significant second hand smoke exposure with a previous marriage.   Of note prior to the patient's visit today, pt has had a Bone Scan completed on 12/13/18 with results revealing Multifocal uptake throughout the skeleton, consistent with diffuse metastatic disease. Primary tumor is not specified. 2. Uptake in the proximal right femur, consistent with lytic lesions. 3. Uptake in the ribs bilaterally as described. 4. Lesions in the proximal left humerus. 5. Diffuse uptake throughout the skull consistent with metastatic disease. 6. Right paramedian uptake at the manubrium.  Most recent lab results (12/08/18) of CBC w/diff and CMP is as follows: all values are WNL except for Glucose at 279, BUN at 24, AST at 41, ALT at 46. 12/08/18 SPEP revealed all values WNL except for Total  Protein at 6.0, Albumin at 3.6, Gamma globulin at 0.7, and M spike at 0.5g  On review of systems, pt reports significant left shoulder pain, back pain, right hip pain, dark urine, and denies fevers, chills, night sweats, unexpected weight loss, changes in bowel habits, changes in breathing, cough, new respiratory symptoms, changes in vision, abdominal pains, leg swelling, and any other symptoms.   On PMHx the pt reports fatty liver, and  denies blood clots.  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 Denies ever smoking.  On Family Hx the pt reports maternal grandmother with colon cancer. Father with bladder cancer and amyloidosis (pt notes that this could have been misdiagnosis). Mother with Protein S deficiency and polymyalgia rheumatica.  Current Treatment: Carfilzomib + Revlimid maintenance.  Interval History:   [name] returns today for management and evaluation of her Multiple Myeloma and C14D15 of maintenance Carfilzomib, Revlimid, and Dexamethasone. The patient's last visit with [name] was on 01/29/2020. The pt reports that she is doing well overall.  The pt reports that she has been a little more tired lately and her hands have been intermittenly cold. Her fingers are warm to the touch although they feel cold and are not numb or tingly. When she warms up her hands the feeling goes away. Pt has also noticed some increased balance issues. She notes that these symptoms occurred after her first dose of the COVID19 vaccine. She is scheduled to receive her second dose of the vaccine. Pt denies any significant issues with the vaccine. Pt has noticed an overall improvement in fatigue and chills since lowering the dose of Carflizomib.  Lab results today (02/26/20) of CBC w/diff and CMP is as follows: all values are WNL except for RBC at 3.77, CO2 at 20, Glucose at 222, Calcium at 8.8, Total Protein at 6.3, Albumin at 3.3, AST at 14.  On review of systems, pt reports fatigue, cold sensation in hands, loss of balance and denies SOB, fevers, chills and any other symptoms.   MEDICAL HISTORY:  Past Medical History:  Diagnosis Date  . Allergy    seasonal  . Asthma   . DEPRESSION   . DIABETES MELLITUS, TYPE II   . Diverticulosis   . HYPERLIPIDEMIA   . Macular degeneration of left eye    mild, Dr.Hecker  . Obesity, unspecified   . Osteoarthritis of both knees   . OSTEOPENIA     . Osteopenia   . URINARY INCONTINENCE     SURGICAL HISTORY: Past Surgical History:  Procedure Laterality Date  . CATARACT EXTRACTION Left 05/24/2018  . CESAREAN SECTION  01/1973  . CYSTOSCOPY/URETEROSCOPY/HOLMIUM LASER/STENT PLACEMENT Right 09/20/2019   Procedure: CYSTOSCOPY/URETEROSCOPY/HOLMIUM LASER/STENT PLACEMENT;  Surgeon: Lucas Mallow, MD;  Location: [facility];  Service: Urology;  Laterality: Right;  . FRACTURE SURGERY    . IR IMAGING GUIDED PORT INSERTION  02/20/2019  . left wrist surgery  2008   By Dr. Latanya Maudlin  . right ankle  1994    SOCIAL HISTORY: Social History   Socioeconomic History  . Marital status: Married    Spouse name: Not on file  . Number of children: 1  . Years of education: Not on file  . Highest education level: Not on file  Occupational History    Employer: [employer]  Tobacco Use  . Smoking status: Never Smoker  . Smokeless tobacco: Never Used  . Tobacco comment: Lives with partner [name]  Ishmael Holter) and son  Substance and Sexual Activity  . Alcohol use: No    Alcohol/week: 0.0 standard drinks  . Drug use: No  . Sexual activity: Never    Partners: Female    Birth control/protection: Post-menopausal    Comment: Lives with female partner (annette hicks) and 63 yo son  Other Topics Concern  . Not on file  Social History Narrative  . Not on file   Social Determinants of Health   Financial Resource Strain:   . Difficulty of Paying Living Expenses:   Food Insecurity:   . Worried About Charity fundraiser in the Last Year:   . Arboriculturist in the Last Year:   Transportation Needs:   . Film/video editor (Medical):   Marland Kitchen Lack of Transportation (Non-Medical):   Physical Activity:   . Days of Exercise per Week:   . Minutes of Exercise per Session:   Stress:   . Feeling of Stress :   Social Connections:   . Frequency of Communication with Friends and Family:   . Frequency of Social Gatherings with  Friends and Family:   . Attends Religious Services:   . Active Member of Clubs or Organizations:   . Attends Archivist Meetings:   Marland Kitchen Marital Status:   Intimate Partner Violence:   . Fear of Current or Ex-Partner:   . Emotionally Abused:   Marland Kitchen Physically Abused:   . Sexually Abused:     FAMILY HISTORY: Family History  Problem Relation Age of Onset  . Diabetes Father   . Hyperlipidemia Father   . Heart disease Father   . Cancer Father   . Hypertension Father   . Colon cancer Paternal Grandmother 108  . Osteoporosis Mother   . Protein S deficiency Mother   . Hyperlipidemia Mother   . Multiple sclerosis Daughter   . Cancer Other        bladder  . Breast cancer Neg Hx     ALLERGIES:  is allergic to penicillins; aleve [naproxen sodium]; and sulfonamide derivatives.  MEDICATIONS:  Current Outpatient Medications  Medication Sig Dispense Refill  . acyclovir (ZOVIRAX) 400 MG tablet TAKE 1 TABLET(400 MG) BY MOUTH TWICE DAILY 60 tablet 1  . aspirin EC 81 MG tablet Take 81 mg by mouth daily after breakfast.     . Blood Glucose Monitoring Suppl (FREESTYLE FREEDOM LITE) W/DEVICE KIT Use to check blood sugars twice a day Dx 250.00 1 each 0  . Calcium Carbonate-Vitamin D (CALCIUM 600+D HIGH POTENCY) 600-400 MG-UNIT per tablet Take 1 tablet by mouth 2 (two) times daily.     . Cetirizine HCl 10 MG CAPS Take 1 capsule (10 mg total) by mouth daily. 30 capsule 1  . dexamethasone (DECADRON) 4 MG tablet Take 5 tablets (20 mg total) by mouth once a week. On D22 of each cycle of treatment 20 tablet 5  . docusate sodium (DOK) 100 MG capsule Take 2 capsules (200 mg total) by mouth at bedtime. 60 capsule 1  . fentaNYL (DURAGESIC) 12 MCG/HR Place 1 patch onto the skin every 3 (three) days. 10 patch 0  . fluticasone (FLONASE) 50 MCG/ACT nasal spray Place 1 spray into both nostrils daily. (Patient taking differently: Place 1 spray into both nostrils daily as needed for allergies or rhinitis. ) 16  g 2  . glipiZIDE (GLUCOTROL XL) 5 MG 24 hr tablet TAKE 1 TABLET(5 MG) BY MOUTH DAILY WITH BREAKFAST 90 tablet 1  . glucose blood (FREESTYLE  LITE) test strip CHECK BLOOD SUGAR TWICE DAILY AS DIRECTED Dx 250.00 180 each 3  . Lancets (FREESTYLE) lancets Use twice daily to check sugars. 100 each 11  . lenalidomide (REVLIMID) 15 MG capsule TAKE 1 CAPSULE BY MOUTH  DAILY FOR 21 DAYS ON, THEN  7 DAYS OFF 21 capsule 0  . lidocaine-prilocaine (EMLA) cream APPLY 1 APPLICATION TO THE AFFECTED AREA AS NEEDED. USE PRIOR TO PORT ACCESS 30 g 0  . LORazepam (ATIVAN) 0.5 MG tablet Take 1 tablet (0.5 mg total) by mouth every 8 (eight) hours as needed for anxiety (significant essential tremors). 60 tablet 0  . metFORMIN (GLUCOPHAGE-XR) 500 MG 24 hr tablet TAKE 3 TABLETS(1500 MG) BY MOUTH DAILY WITH BREAKFAST 270 tablet 1  . Multiple Vitamins-Minerals (ICAPS) CAPS Take 1 capsule by mouth daily after breakfast.     . ondansetron (ZOFRAN) 8 MG tablet Take 1 tablet (8 mg total) by mouth 2 (two) times daily as needed (Nausea or vomiting). 30 tablet 1  . Oxycodone HCl 10 MG TABS Take 1 tablet (10 mg total) by mouth every 6 (six) hours as needed. 90 tablet 0  . pantoprazole (PROTONIX) 20 MG tablet TAKE 1 TABLET(20 MG) BY MOUTH DAILY 30 tablet 5  . polyethylene glycol (MIRALAX / GLYCOLAX) packet Take 17 g by mouth daily after breakfast.     . potassium chloride SA (KLOR-CON) 20 MEQ tablet Take 1 tablet (20 mEq total) by mouth 2 (two) times daily. 60 tablet 1  . prochlorperazine (COMPAZINE) 10 MG tablet Take 1 tablet (10 mg total) by mouth every 6 (six) hours as needed (Nausea or vomiting). 30 tablet 1  . sertraline (ZOLOFT) 50 MG tablet TAKE 1 TABLET BY MOUTH DAILY 90 tablet 0  . simvastatin (ZOCOR) 20 MG tablet TAKE 1 TABLET(20 MG) BY MOUTH DAILY 90 tablet 0  . Triamcinolone Acetonide 0.025 % LOTN Apply 1 application topically 3 (three) times daily as needed (rash/itching). 60 mL 2  . Vitamin D, Ergocalciferol, (DRISDOL)  1.25 MG (50000 UNIT) CAPS capsule TAKE 1 CAPSULE BY MOUTH EVERY 7 DAYS 12 capsule 0   No current facility-administered medications for this visit.   Facility-Administered Medications Ordered in Other Visits  Medication Dose Route Frequency Provider Last Rate Last Admin  . heparin lock flush 100 unit/mL  500 Units Intracatheter Once PRN Brunetta Genera, MD      . sodium chloride flush (NS) 0.9 % injection 10 mL  10 mL Intracatheter PRN Brunetta Genera, MD   10 mL at 10/02/19 1524    REVIEW OF SYSTEMS:   A 10+ POINT REVIEW OF SYSTEMS WAS OBTAINED including neurology, dermatology, psychiatry, cardiac, respiratory, lymph, extremities, GI, GU, Musculoskeletal, constitutional, breasts, reproductive, HEENT.  All pertinent positives are noted in the HPI.  All others are negative.   PHYSICAL EXAMINATION: ECOG FS:2 - Symptomatic, <50% confined to bed  Vitals:   02/26/20 1158  BP: 133/66  Pulse: 81  Resp: 18  Temp: 97.8 F (36.6 C)  SpO2: 97%   Wt Readings from Last 3 Encounters:  02/26/20 163 lb 9.6 oz (74.2 kg)  01/29/20 165 lb 4.8 oz (75 kg)  01/15/20 164 lb 8 oz (74.6 kg)   Body mass index is 33.04 kg/m.    GENERAL:alert, in no acute distress and comfortable SKIN: no acute rashes, no significant lesions EYES: conjunctiva are pink and non-injected, sclera anicteric OROPHARYNX: MMM, no exudates, no oropharyngeal erythema or ulceration NECK: supple, no JVD LYMPH:  no palpable lymphadenopathy in  the cervical, axillary or inguinal regions LUNGS: clear to auscultation b/l with normal respiratory effort HEART: regular rate & rhythm ABDOMEN:  normoactive bowel sounds , non tender, not distended. No palpable hepatosplenomegaly.  Extremity: no pedal edema PSYCH: alert & oriented x 3 with fluent speech NEURO: no focal motor/sensory deficits  LABORATORY DATA:  I have reviewed the data as listed  . CBC Latest Ref Rng & Units 02/26/2020 02/12/2020 01/29/2020  WBC 4.0 - 10.5 K/uL  5.4 5.8 5.0  Hemoglobin 12.0 - 15.0 g/dL 12.6 12.5 12.5  Hematocrit 36.0 - 46.0 % 37.2 37.3 36.5  Platelets 150 - 400 K/uL 223 175 231   . CBC    Component Value Date/Time   WBC 5.4 02/26/2020 1105   RBC 3.77 (L) 02/26/2020 1105   HGB 12.6 02/26/2020 1105   HGB 12.8 02/16/2019 1138   HCT 37.2 02/26/2020 1105   PLT 223 02/26/2020 1105   PLT 170 02/16/2019 1138   MCV 98.7 02/26/2020 1105   MCH 33.4 02/26/2020 1105   MCHC 33.9 02/26/2020 1105   RDW 13.4 02/26/2020 1105   LYMPHSABS 1.2 02/26/2020 1105   MONOABS 1.0 02/26/2020 1105   EOSABS 0.1 02/26/2020 1105   BASOSABS 0.1 02/26/2020 1105    . CMP Latest Ref Rng & Units 02/26/2020 02/12/2020 01/29/2020  Glucose 70 - 99 mg/dL 222(H) 189(H) 196(H)  BUN 8 - 23 mg/dL '12 10 14  ' Creatinine 0.44 - 1.00 mg/dL 0.89 0.83 0.84  Sodium 135 - 145 mmol/L 141 139 140  Potassium 3.5 - 5.1 mmol/L 3.6 3.7 3.1(L)  Chloride 98 - 111 mmol/L 110 108 107  CO2 22 - 32 mmol/L 20(L) 23 23  Calcium 8.9 - 10.3 mg/dL 8.8(L) 9.1 9.4  Total Protein 6.5 - 8.1 g/dL 6.3(L) 6.1(L) 6.3(L)  Total Bilirubin 0.3 - 1.2 mg/dL 0.6 0.7 0.7  Alkaline Phos 38 - 126 U/L 73 72 73  AST 15 - 41 U/L 14(L) 13(L) 13(L)  ALT 0 - 44 U/L '15 17 16   ' 09/18/2019 BM Bx Report ([id number])   09/18/2019 FISH Panel    05/30/2019 BM Bx   01/06/2019 BM Bx:     01/06/19 Cytogenetics:      05/30/19 BM Biopsy:   09/18/2019 FISH Panel    09/18/2019 BM Surgical Pathology ([id number])     RADIOGRAPHIC STUDIES: I have personally reviewed the radiological images as listed and agreed with the findings in the report. No results found.  ASSESSMENT & PLAN:   79 y.o. female with  1. Recently diagnosed Multiple Myeloma, RISS Stage III  Labs upon initial presentation from 12/08/18, blood counts are normal including WBC at 7.1k, HGB at 13.1, and PLT at 245k. Calcium normal at 10.3. Creatinine normal at 0.63. M spike at 0.5g. 12/13/18 Bone Scan revealed Multifocal  uptake throughout the skeleton, consistent with diffuse metastatic disease. Primary tumor is not specified. 2. Uptake in the proximal right femur, consistent with lytic lesions. 3. Uptake in the ribs bilaterally as described. 4. Lesions in the proximal left humerus. 5. Diffuse uptake throughout the skull consistent with metastatic disease. 6. Right paramedian uptake at the manubrium.  12/13/18 CT Right Femur revealed Numerous lytic lesions involving the right femur and a lytic lesion in the left inferior pubic ramus. Overall appearance is most concerning for multiple myeloma  12/27/18 Pretreatment 24hour UPEP observed an M spike at 98m, and showed 1983mtotal protein/day.  12/27/18 Pretreatment MMP revealed M Protein at 0.5g with IgG Lambda specificity. Kappa:Lambda light chain  ratio at 0.13, with Lambda at 40.3. There is less abnormal protein and light chains than I would expect from 30% plasma cells, which suggests hypo-secretory or non-secretory neoplastic plasma cells. Will have an impact in assessing response. 01/05/19 PET/CT revealed Innumerable lytic lesions in the skeleton compatible with myeloma. Most of the larger lesions are hypermetabolic, for example including a left proximal humeral shaft lesion with maximum SUV of 8.1 and a 2.8 cm lesion in the left T9 vertebral body with maximum SUV 5.1. Most of the smaller lytic lesions, and some of the larger lesions, do not demonstrate accentuated metabolic activity. 2. 1.2 cm in short axis lymph node in the left parapharyngeal space is hypermetabolic with maximum SUV 11.8. I do not see a separate mass in the head and neck to give rise to this hypermetabolic lymph node. 3. Mosaic attenuation in the lower lobes, nonspecific possibly from air trapping. 4.  Aortic Atherosclerosis 5. Heterogeneous activity in the liver, making it hard to exclude small liver lesions. Consider hepatic protocol MRI with and without contrast for definitive assessment. Nonobstructive  right nephrolithiasis. Old granulomatous disease  01/06/19 Bone Marrow biopsy revealed interstitial increase in plasma cells (28% aspirate, 40% CD138 immunohistochemistry). Plasma cells negative for light chains consistent with a non or weakly secretory myeloma   01/06/19 Cytogenetics revealed 37% of cells with trisomy 11 or 11q deletion, and 40.5% of cells with 17p mutation  S/p 5 cycles of KRD treatment  05/31/19 BM Biopsy revealed mild atypical plasmacytosis at 5% with polytypic variation.   06/01/19 PET/CT revealed "Dominant lesion in the LEFT humerus is decreased significantly in metabolic activity. Additional hypermetabolic skeletal lytic lesions have decreased in metabolic activity or similar to comparison exam (01/05/2019). No evidence of disease progression. 2. Multiple additional lytic lesions do not have metabolic activity and unchanged. 3. No new skeletal lesions are identified. No soft tissue plasmacytoma identified. 4. Nodule / node in the LEFT parapharyngeal space which is intensely hypermetabolic not changed from prior. 5. New hypermetabolic LEFT lower lobe pulmonary nodule is indeterminate. Recommend close attention on follow-up 6. New obstructive hydronephrosis of the RIGHT kidney related to RIGHT UPJ stone."  09/18/2019 BM Bx Report which revealed "Slightly hypercellular bone marrow for age with trilineage hematopoiesis and 1% plasma cells."  09/14/2019 PET/CT Whole Body Scan ([id number]) which revealed "1. There widespread tiny lytic lesions compatible with multiple myeloma. Index larger lesions are generally similar to the prior exam, with low-grade activity such as the left T9 vertebral body lesion with maximum SUV 4.5. Is mild increase in the activity associated with a mildly sclerotic left proximal humeral lesion, maximum SUV 4.8 (previously 3.5). 2. At the site of the prior left lower lobe nodule is currently more bandlike thickening, with maximum SUV only 1.9, probably benign,  continued surveillance of this region suggested. 3. There several small but hypermetabolic lymph nodes. This includes a left parapharyngeal space node measuring 1.0 cm with maximum SUV 12.3 (stable); a left level IB lymph node measuring 0.5 cm with maximum SUV 4.8 (slightly larger than prior); and a left inguinal lymph node measuring 0.7 cm in short axis with maximum SUV 6.4 (previously 0.5 cm with maximum SUV 0.6). Significance of these lymph nodes uncertain, surveillance is recommended. 4. New 5 mm left lower lobe subpleural nodule on image 32/8, not appreciably hypermetabolic, surveillance suggested. 5. Focal subcutaneous stranding along the left perineum measuring about 2.6 by 1.1 cm on image 221/4, maximum SUV 12.5. This was not present previously and is most  likely inflammatory, although given the notable SUV, surveillance of this region is suggested. 6. Other imaging findings of potential clinical significance: Aortic Atherosclerosis (ICD10-I70.0). Coronary atherosclerosis. Old granulomatous disease. Mild right hydronephrosis due to a 7 mm right UPJ calculus. 2 mm right kidney upper pole nonobstructive renal calculus. Prominent stool throughout the colon favors constipation."  12/19/2019 Thoracic & Lumbar Spine MRI ([id number]) ([id number]) revealed "Suspected myeloma lesions at T9 and S1. No compression deformity or epidural disease.  01/18/2020 PET/CT ([id number]) which revealed "1. Stable lytic lesions throughout the skeleton. The larger lytic lesions which had mild metabolic activity on comparison exam now have background metabolic activity. No evidence of active myeloma. No evidence of progression multiple myeloma.  No plasmacytoma 3. Hypermetabolic nodules in the LEFT neck may be associated deep tissues of the LEFT parotid gland. 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 Heterogeneous liver activity, as seen on 01/05/19  PET/CT Extra-medullary hematopoiesis vs metabolic liver disease vs hepatic malignancy ?  01/17/19 MRI Liver revealed Several appreciable liver lesions all have benign imaging characteristics. No MRI findings of metastatic involvement of the liver. 2. Scattered bony lesions corresponding to the lytic lesions seen at PET-CT, compatible with active myeloma. 3. Aortic Atherosclerosis.  Mild cardiomegaly. 4. Diffuse hepatic steatosis.   3. Left lower lobe pulmonary nodule First seen on 06/01/19 PET/CT  PLAN: -Discussed pt labwork today, 02/26/20; blood counts are normal, blood chemistries are stable, Glucose is running a little high, Potassium levels have improved -The pt shows no lab or clinical evidence of Mulitple Myeloma progression at this time -The pt has not prohibitive toxicities from continuing C14D15 Carfilzomib at this time. -Will continue Carflizomib at 56 mg/m^2q 2weeks for maintainence -Continue current dose of Revlimid for maintainence -Recommend pt continue eating potassium-rich foods -Recommend pt f/u as scheduled for the second dose of the COVID19 vaccine -Will see back in 4 weeks with labs   FOLLOW UP: Please schedule next 2 cycles (4 doses) of Carfilzomib with portflush and labs MD visit in 4 weeks   The total time spent in the appt was 20 minutes and more than 50% was on counseling and direct patient cares.  All of the patient's questions were answered with apparent satisfaction. The patient knows to call the clinic with any problems, questions or concerns.    Sullivan Lone MD [place] AAHIVMS [facility] Hematology/Oncology Physician [facility]  (Office):       [phone] (Work cell):  [phone] (Fax):           [phone]  02/26/2020 12:53 PM  I, Yevette Edwards, am acting as a scribe for Dr. Sullivan Lone.   .I have reviewed the above documentation for accuracy and completeness, and I agree with the above. Brunetta Genera MD

## 2020-02-26 NOTE — Patient Instructions (Signed)

## 2020-02-27 ENCOUNTER — Other Ambulatory Visit: Payer: Self-pay | Admitting: Internal Medicine

## 2020-02-28 ENCOUNTER — Telehealth: Payer: Self-pay | Admitting: Hematology

## 2020-02-28 NOTE — Telephone Encounter (Signed)
Scheduled per 03/15 los, patient has been called and voicemail was left.

## 2020-03-05 ENCOUNTER — Ambulatory Visit: Payer: Medicare PPO | Attending: Internal Medicine

## 2020-03-05 DIAGNOSIS — Z23 Encounter for immunization: Secondary | ICD-10-CM

## 2020-03-05 NOTE — Progress Notes (Signed)
   Covid-19 Vaccination Clinic  Name:  Faith Orr    MRN: [id number] DOB: 1941/05/09  03/05/2020  [name] was observed post Covid-19 immunization for 15 minutes without incident. She was provided with Vaccine Information Sheet and instruction to access the V-Safe system.   [name] was instructed to call 911 with any severe reactions post vaccine: Marland Kitchen Difficulty breathing  . Swelling of face and throat  . A fast heartbeat  . A bad rash all over body  . Dizziness and weakness   Immunizations Administered    Name Date Dose VIS Date Route   Pfizer COVID-19 Vaccine 03/05/2020 10:59 AM 0.3 mL 11/24/2019 Intramuscular   Manufacturer: [place]   Lot: [id number]   [place]: [id number]

## 2020-03-07 DIAGNOSIS — H353132 Nonexudative age-related macular degeneration, bilateral, intermediate dry stage: Secondary | ICD-10-CM | POA: Diagnosis not present

## 2020-03-07 DIAGNOSIS — E119 Type 2 diabetes mellitus without complications: Secondary | ICD-10-CM | POA: Diagnosis not present

## 2020-03-07 DIAGNOSIS — Z961 Presence of intraocular lens: Secondary | ICD-10-CM | POA: Diagnosis not present

## 2020-03-07 DIAGNOSIS — H40013 Open angle with borderline findings, low risk, bilateral: Secondary | ICD-10-CM | POA: Diagnosis not present

## 2020-03-09 ENCOUNTER — Encounter: Payer: Self-pay | Admitting: Hematology

## 2020-03-12 ENCOUNTER — Other Ambulatory Visit: Payer: Self-pay | Admitting: Hematology

## 2020-03-12 ENCOUNTER — Encounter: Payer: Self-pay | Admitting: Internal Medicine

## 2020-03-12 MED ORDER — OXYCODONE HCL 10 MG PO TABS: 10.0000 mg | ORAL_TABLET | Freq: Four times a day (QID) | ORAL | 0 refills | Status: DC | PRN

## 2020-03-13 ENCOUNTER — Encounter: Payer: Self-pay | Admitting: Hematology

## 2020-03-13 ENCOUNTER — Other Ambulatory Visit: Payer: Self-pay | Admitting: Hematology

## 2020-03-13 ENCOUNTER — Encounter: Payer: Self-pay | Admitting: Internal Medicine

## 2020-03-13 DIAGNOSIS — C9000 Multiple myeloma not having achieved remission: Secondary | ICD-10-CM

## 2020-03-13 NOTE — Progress Notes (Signed)
Pt was approved w/ the [facility] for Kyprolis from 01/01/20 to 12/30/20 for $11,000.

## 2020-03-13 NOTE — Telephone Encounter (Signed)
Refill escribed to [place] [facility] Refilled per Dr. Irene Limbo OV note 02/26/2020

## 2020-03-13 NOTE — Telephone Encounter (Signed)
She sent an earlier my chart about this. If you do see it tomorrow and she agrees to see some else you can scheduler her if you have chance. She has a large size rash and raised area after getting the COVID vaccine.

## 2020-03-14 ENCOUNTER — Ambulatory Visit: Payer: Medicare PPO | Admitting: Internal Medicine

## 2020-03-14 ENCOUNTER — Encounter: Payer: Self-pay | Admitting: Internal Medicine

## 2020-03-14 ENCOUNTER — Other Ambulatory Visit: Payer: Self-pay

## 2020-03-14 DIAGNOSIS — E118 Type 2 diabetes mellitus with unspecified complications: Secondary | ICD-10-CM

## 2020-03-14 DIAGNOSIS — R21 Rash and other nonspecific skin eruption: Secondary | ICD-10-CM

## 2020-03-14 MED ORDER — METHYLPREDNISOLONE 4 MG PO TBPK: ORAL_TABLET | ORAL | 0 refills | Status: DC

## 2020-03-14 MED ORDER — DOXYCYCLINE HYCLATE 100 MG PO TABS: 100.0000 mg | ORAL_TABLET | Freq: Two times a day (BID) | ORAL | 1 refills | Status: DC

## 2020-03-14 MED ORDER — TRIAMCINOLONE ACETONIDE 0.5 % EX CREA: 1.0000 "application " | TOPICAL_CREAM | Freq: Four times a day (QID) | CUTANEOUS | 1 refills | Status: AC

## 2020-03-14 NOTE — Assessment & Plan Note (Signed)
We discussed potential high for glycemic effect of Medrol Dosepak.  She is aware.

## 2020-03-14 NOTE — Progress Notes (Signed)
Subjective:  Patient ID: Faith Orr, female    DOB: 15-Aug-1941  Age: 79 y.o. MRN: [id number]  CC: No chief complaint on file.   HPI Chelcy Bolda presents for a substantial rash that appeared after Jissell had her second [employer] vaccine on 3/21.  The rash is located on the right proximal arm and has spread significantly over Tuesday and Wednesday.  It has been itching and somewhat painful.  She use her allergy meds and triamcinolone lotion and lidocaine cream.  It feels a little better today, however, it has spread more.  No fever, chills, nausea, vomiting.  Outpatient Medications Prior to Visit  Medication Sig Dispense Refill  . acyclovir (ZOVIRAX) 400 MG tablet TAKE 1 TABLET(400 MG) BY MOUTH TWICE DAILY 60 tablet 1  . aspirin EC 81 MG tablet Take 81 mg by mouth daily after breakfast.     . Blood Glucose Monitoring Suppl (FREESTYLE FREEDOM LITE) W/DEVICE KIT Use to check blood sugars twice a day Dx 250.00 1 each 0  . Calcium Carbonate-Vitamin D (CALCIUM 600+D HIGH POTENCY) 600-400 MG-UNIT per tablet Take 1 tablet by mouth 2 (two) times daily.     . Cetirizine HCl 10 MG CAPS Take 1 capsule (10 mg total) by mouth daily. 30 capsule 1  . dexamethasone (DECADRON) 4 MG tablet Take 5 tablets (20 mg total) by mouth once a week. On D22 of each cycle of treatment 20 tablet 5  . docusate sodium (DOK) 100 MG capsule Take 2 capsules (200 mg total) by mouth at bedtime. 60 capsule 1  . fentaNYL (DURAGESIC) 12 MCG/HR Place 1 patch onto the skin every 3 (three) days. 10 patch 0  . fluticasone (FLONASE) 50 MCG/ACT nasal spray Place 1 spray into both nostrils daily. (Patient taking differently: Place 1 spray into both nostrils daily as needed for allergies or rhinitis. ) 16 g 2  . glipiZIDE (GLUCOTROL XL) 5 MG 24 hr tablet TAKE 1 TABLET(5 MG) BY MOUTH DAILY WITH BREAKFAST 90 tablet 1  . glucose blood (FREESTYLE LITE) test strip CHECK BLOOD SUGAR TWICE DAILY AS DIRECTED Dx 250.00 180 each 3  .  Lancets (FREESTYLE) lancets Use twice daily to check sugars. 100 each 11  . lenalidomide (REVLIMID) 15 MG capsule TAKE 1 CAPSULE BY MOUTH  DAILY FOR 21 DAYS ON, THEN  7 DAYS OFF 21 capsule 0  . lidocaine-prilocaine (EMLA) cream APPLY 1 APPLICATION TO THE AFFECTED AREA AS NEEDED. USE PRIOR TO PORT ACCESS 30 g 0  . LORazepam (ATIVAN) 0.5 MG tablet Take 1 tablet (0.5 mg total) by mouth every 8 (eight) hours as needed for anxiety (significant essential tremors). 60 tablet 0  . metFORMIN (GLUCOPHAGE-XR) 500 MG 24 hr tablet TAKE 3 TABLETS(1500 MG) BY MOUTH DAILY WITH BREAKFAST 270 tablet 1  . Multiple Vitamins-Minerals (ICAPS) CAPS Take 1 capsule by mouth daily after breakfast.     . ondansetron (ZOFRAN) 8 MG tablet Take 1 tablet (8 mg total) by mouth 2 (two) times daily as needed (Nausea or vomiting). 30 tablet 1  . Oxycodone HCl 10 MG TABS Take 1 tablet (10 mg total) by mouth every 6 (six) hours as needed. 90 tablet 0  . pantoprazole (PROTONIX) 20 MG tablet TAKE 1 TABLET(20 MG) BY MOUTH DAILY 30 tablet 5  . polyethylene glycol (MIRALAX / GLYCOLAX) packet Take 17 g by mouth daily after breakfast.     . potassium chloride SA (KLOR-CON) 20 MEQ tablet Take 1 tablet (20 mEq total) by mouth  2 (two) times daily. 60 tablet 1  . prochlorperazine (COMPAZINE) 10 MG tablet Take 1 tablet (10 mg total) by mouth every 6 (six) hours as needed (Nausea or vomiting). 30 tablet 1  . sertraline (ZOLOFT) 50 MG tablet TAKE 1 TABLET BY MOUTH DAILY 90 tablet 0  . simvastatin (ZOCOR) 20 MG tablet TAKE 1 TABLET(20 MG) BY MOUTH DAILY 90 tablet 0  . Triamcinolone Acetonide 0.025 % LOTN Apply 1 application topically 3 (three) times daily as needed (rash/itching). 60 mL 2  . Vitamin D, Ergocalciferol, (DRISDOL) 1.25 MG (50000 UNIT) CAPS capsule TAKE 1 CAPSULE BY MOUTH EVERY 7 DAYS 12 capsule 0   Facility-Administered Medications Prior to Visit  Medication Dose Route Frequency Provider Last Rate Last Admin  . heparin lock flush 100  unit/mL  500 Units Intracatheter Once PRN Brunetta Genera, MD      . sodium chloride flush (NS) 0.9 % injection 10 mL  10 mL Intracatheter PRN Brunetta Genera, MD   10 mL at 10/02/19 1524    ROS: Review of Systems  Constitutional: Positive for fatigue. Negative for activity change, appetite change, chills, fever and unexpected weight change.  HENT: Negative for congestion, mouth sores and sinus pressure.   Eyes: Negative for visual disturbance.  Respiratory: Negative for cough and chest tightness.   Gastrointestinal: Negative for abdominal pain and nausea.  Genitourinary: Negative for difficulty urinating, frequency and vaginal pain.  Musculoskeletal: Positive for gait problem. Negative for back pain.  Skin: Positive for rash. Negative for color change and pallor.  Neurological: Positive for weakness. Negative for dizziness, tremors, numbness and headaches.  Psychiatric/Behavioral: Negative for confusion and sleep disturbance.    Objective:  BP 132/78 (BP Location: Left Arm, Patient Position: Sitting, Cuff Size: Normal)   Pulse 76   Temp 98.1 F (36.7 C) (Oral)   Ht _0  (1.499 m)   Wt 162 lb (73.5 kg)   LMP 10/09/2012   SpO2 97%   BMI 32.72 kg/m   BP Readings from Last 3 Encounters:  03/14/20 132/78  02/26/20 133/66  02/12/20 116/69    Wt Readings from Last 3 Encounters:  03/14/20 162 lb (73.5 kg)  02/26/20 163 lb 9.6 oz (74.2 kg)  01/29/20 165 lb 4.8 oz (75 kg)    Physical Exam Constitutional:      General: She is not in acute distress.    Appearance: She is well-developed. She is obese. She is not ill-appearing.  HENT:     Head: Normocephalic.     Right Ear: External ear normal.     Left Ear: External ear normal.     Nose: Nose normal.  Eyes:     General:        Right eye: No discharge.        Left eye: No discharge.     Conjunctiva/sclera: Conjunctivae normal.     Pupils: Pupils are equal, round, and reactive to light.  Neck:     Thyroid: No  thyromegaly.     Vascular: No JVD.     Trachea: No tracheal deviation.  Cardiovascular:     Rate and Rhythm: Normal rate and regular rhythm.     Heart sounds: Normal heart sounds.  Pulmonary:     Effort: No respiratory distress.     Breath sounds: No stridor. No wheezing.  Abdominal:     General: Bowel sounds are normal. There is no distension.     Palpations: Abdomen is soft. There is no mass.  Tenderness: There is no abdominal tenderness. There is no guarding or rebound.  Musculoskeletal:        General: No tenderness.     Cervical back: Normal range of motion and neck supple.  Lymphadenopathy:     Cervical: No cervical adenopathy.  Skin:    Findings: Erythema and rash present.  Neurological:     Cranial Nerves: No cranial nerve deficit.     Motor: No abnormal muscle tone.     Coordination: Coordination normal.     Deep Tendon Reflexes: Reflexes normal.  Psychiatric:        Behavior: Behavior normal.        Thought Content: Thought content normal.        Judgment: Judgment normal.    The patient is using a walker Rash-see pictures below.  I marked the borders with my ballpoint pen  Lab Results  Component Value Date   WBC 5.4 02/26/2020   HGB 12.6 02/26/2020   HCT 37.2 02/26/2020   PLT 223 02/26/2020   GLUCOSE 222 (H) 02/26/2020   CHOL 185 06/06/2018   TRIG 333.0 (H) 06/06/2018   HDL 47.40 06/06/2018   LDLDIRECT 98.0 06/06/2018   LDLCALC 107 (H) 05/28/2010   ALT 15 02/26/2020   AST 14 (L) 02/26/2020   NA 141 02/26/2020   K 3.6 02/26/2020   CL 110 02/26/2020   CREATININE 0.89 02/26/2020   BUN 12 02/26/2020   CO2 20 (L) 02/26/2020   TSH 1.12 06/06/2018   INR 0.9 09/18/2019   HGBA1C 5.8 (H) 06/22/2019   MICROALBUR 5.0 (H) 12/08/2018    NM PET Image Restage (PS) Whole Body  Result Date: 01/18/2020 CLINICAL DATA:  Subsequent treatment strategy for multiple myeloma. EXAM: NUCLEAR MEDICINE PET WHOLE BODY TECHNIQUE: 8.1 mCi F-18 FDG was injected intravenously.  Full-ring PET imaging was performed from the skull base to thigh after the radiotracer. CT data was obtained and used for attenuation correction and anatomic localization. Fasting blood glucose: 120 mg/dl COMPARISON:  PET-CT scan _0 FINDINGS: Mediastinal blood pool activity: SUV max 3.0 Liver activity: 3.6 HEAD/NECK: Hypermetabolic nodule in the LEFT parapharyngeal space adjacent to the styloid process again demonstrated with SUV max equal 11.7 compared SUV max equal 12. 3. A second hypermetabolic nodule in the LEFT level 2 neck, anterior sternocleidomastoid muscle, with SUV max equal 6.9 unchanged from 4.8. Both these nodules are slightly dense (image 48/4 and image 57/4). It Is conceivable the both these nodules could be within parotid tissue and therefore primary parotid neoplasms. Incidental CT findings: none CHEST: No hypermetabolic mediastinal or hilar nodes. No suspicious pulmonary nodules on the CT scan. Resolution of subpleural nodule in the LEFT lower lobe. Band of linear atelectasis in the LEFT lower lobe remains. No suspicious nodularity incidental CT findings: none ABDOMEN/PELVIS: No abnormal hypermetabolic activity within the liver, pancreas, adrenal glands, or spleen. No hypermetabolic lymph nodes in the abdomen or pelvis. Incidental CT findings: Interval relief of the previously seen RIGHT ureteral calculus SKELETON: Again demonstrated lytic lesions which do not have significant radiotracer activity. For example lesion in T9 vertebral body measuring 2 cm (106/4 with SUV max equal 2.6 decreased from 4.5. Example lesion in the LEFT pubic ramus with SUV max equal 2.0 compared SUV max equal 1.5. No change in size. No new lytic lesions are identified. There are multiple scattered additional smaller lytic lesions are unchanged. Incidental CT findings: There are multiple healed rib fractures of LEFT and RIGHT ribs which do have radiotracer  activity but are unchanged from prior favored posttraumatic.  EXTREMITIES: No abnormal hypermetabolic activity in the lower extremities. Incidental CT findings: none IMPRESSION: 1. Stable lytic lesions throughout the skeleton. The larger lytic lesions which had mild metabolic activity on comparison exam now have background metabolic activity. No evidence of active myeloma. 2. No evidence of progression multiple myeloma.  No plasmacytoma 3. Hypermetabolic nodules in the LEFT neck may be associated deep tissues of the LEFT parotid gland. 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 Electronically Signed   By: Suzy Bouchard M.D.   On: 01/18/2020 11:55    Assessment & Plan:   Diagnoses and all orders for this visit:  Rash  Other orders -     doxycycline (VIBRA-TABS) 100 MG tablet; Take 1 tablet (100 mg total) by mouth 2 (two) times daily. -     methylPREDNISolone (MEDROL DOSEPAK) 4 MG TBPK tablet; As directed -     triamcinolone cream (KENALOG) 0.5 %; Apply 1 application topically 4 (four) times daily.     Meds ordered this encounter  Medications  . doxycycline (VIBRA-TABS) 100 MG tablet    Sig: Take 1 tablet (100 mg total) by mouth 2 (two) times daily.    Dispense:  14 tablet    Refill:  1  . methylPREDNISolone (MEDROL DOSEPAK) 4 MG TBPK tablet    Sig: As directed    Dispense:  21 tablet    Refill:  0  . triamcinolone cream (KENALOG) 0.5 %    Sig: Apply 1 application topically 4 (four) times daily.    Dispense:  90 g    Refill:  1     Follow-up: No follow-ups on file.  Walker Kehr, MD

## 2020-03-14 NOTE — Assessment & Plan Note (Addendum)
Faith Orr had her second [employer] vaccine on 3/21.   Post-vaccine local allergic skin reaction versus local allergic reaction and cellulitis.  I marked the borders.  Will treat empirically with doxycycline and Medrol Dosepak.  Given a prescription for triamcinolone 0.5% cream to use 4 times a day.  Cold compress.  Call if worse.

## 2020-03-20 ENCOUNTER — Other Ambulatory Visit: Payer: Self-pay | Admitting: Hematology

## 2020-03-20 NOTE — Progress Notes (Signed)
Pharmacist Chemotherapy Monitoring - Follow Up Assessment    I verify that I have reviewed each item in the below checklist:  . Regimen for the patient is scheduled for the appropriate day and plan matches scheduled date. Marland Kitchen Appropriate non-routine labs are ordered dependent on drug ordered. . If applicable, additional medications reviewed and ordered per protocol based on lifetime cumulative doses and/or treatment regimen.   Plan for follow-up and/or issues identified: No . I-vent associated with next due treatment: No . MD and/or nursing notified: No  Faith Orr D 03/20/2020 11:34 AM

## 2020-03-21 ENCOUNTER — Encounter: Payer: Self-pay | Admitting: Hematology

## 2020-03-21 ENCOUNTER — Other Ambulatory Visit: Payer: Self-pay | Admitting: *Deleted

## 2020-03-21 DIAGNOSIS — C9000 Multiple myeloma not having achieved remission: Secondary | ICD-10-CM

## 2020-03-21 DIAGNOSIS — Z7189 Other specified counseling: Secondary | ICD-10-CM

## 2020-03-21 MED ORDER — DOCUSATE SODIUM 100 MG PO CAPS: 200.0000 mg | ORAL_CAPSULE | Freq: Every day | ORAL | 1 refills | Status: DC

## 2020-03-21 MED ORDER — ACYCLOVIR 400 MG PO TABS: ORAL_TABLET | ORAL | 1 refills | Status: DC

## 2020-03-21 MED ORDER — OXYCODONE HCL 10 MG PO TABS: 10.0000 mg | ORAL_TABLET | Freq: Four times a day (QID) | ORAL | 0 refills | Status: DC | PRN

## 2020-03-21 NOTE — Telephone Encounter (Signed)
Please advise on refill for Potassium

## 2020-03-21 NOTE — Telephone Encounter (Signed)
Patient requests refill of acyclovir and docusate sodium

## 2020-03-21 NOTE — Telephone Encounter (Signed)
Patient requested refill of fentanyl patches

## 2020-03-25 ENCOUNTER — Ambulatory Visit: Payer: Medicare PPO | Admitting: Hematology

## 2020-03-25 ENCOUNTER — Other Ambulatory Visit: Payer: Medicare PPO

## 2020-03-25 ENCOUNTER — Ambulatory Visit: Payer: Medicare PPO

## 2020-03-26 ENCOUNTER — Inpatient Hospital Stay: Payer: Medicare PPO | Admitting: Hematology

## 2020-03-26 ENCOUNTER — Other Ambulatory Visit: Payer: Self-pay

## 2020-03-26 ENCOUNTER — Inpatient Hospital Stay: Payer: Medicare PPO

## 2020-03-26 ENCOUNTER — Inpatient Hospital Stay: Payer: Medicare PPO | Attending: Hematology

## 2020-03-26 VITALS — BP 133/65 | HR 74 | Temp 97.8°F | Resp 17 | Ht 59.0 in | Wt 163.3 lb

## 2020-03-26 DIAGNOSIS — Z7189 Other specified counseling: Secondary | ICD-10-CM

## 2020-03-26 DIAGNOSIS — C9000 Multiple myeloma not having achieved remission: Secondary | ICD-10-CM

## 2020-03-26 DIAGNOSIS — Z5111 Encounter for antineoplastic chemotherapy: Secondary | ICD-10-CM | POA: Diagnosis not present

## 2020-03-26 DIAGNOSIS — Z5112 Encounter for antineoplastic immunotherapy: Secondary | ICD-10-CM | POA: Diagnosis not present

## 2020-03-26 DIAGNOSIS — C7951 Secondary malignant neoplasm of bone: Secondary | ICD-10-CM

## 2020-03-26 DIAGNOSIS — Z95828 Presence of other vascular implants and grafts: Secondary | ICD-10-CM

## 2020-03-26 LAB — CBC WITH DIFFERENTIAL/PLATELET
Abs Immature Granulocytes: 0.01 10*3/uL (ref 0.00–0.07)
Basophils Absolute: 0.1 10*3/uL (ref 0.0–0.1)
Basophils Relative: 2 %
Eosinophils Absolute: 0.1 10*3/uL (ref 0.0–0.5)
Eosinophils Relative: 3 %
HCT: 37.9 % (ref 36.0–46.0)
Hemoglobin: 12.7 g/dL (ref 12.0–15.0)
Immature Granulocytes: 0 %
Lymphocytes Relative: 18 %
Lymphs Abs: 0.9 10*3/uL (ref 0.7–4.0)
MCH: 33.6 pg (ref 26.0–34.0)
MCHC: 33.5 g/dL (ref 30.0–36.0)
MCV: 100.3 fL — ABNORMAL HIGH (ref 80.0–100.0)
Monocytes Absolute: 1.2 10*3/uL — ABNORMAL HIGH (ref 0.1–1.0)
Monocytes Relative: 24 %
Neutro Abs: 2.8 10*3/uL (ref 1.7–7.7)
Neutrophils Relative %: 53 %
Platelets: 145 10*3/uL — ABNORMAL LOW (ref 150–400)
RBC: 3.78 MIL/uL — ABNORMAL LOW (ref 3.87–5.11)
RDW: 13.1 % (ref 11.5–15.5)
WBC: 5.1 10*3/uL (ref 4.0–10.5)
nRBC: 0 % (ref 0.0–0.2)

## 2020-03-26 LAB — CMP (CANCER CENTER ONLY)
ALT: 16 U/L (ref 0–44)
AST: 13 U/L — ABNORMAL LOW (ref 15–41)
Albumin: 3.1 g/dL — ABNORMAL LOW (ref 3.5–5.0)
Alkaline Phosphatase: 71 U/L (ref 38–126)
Anion gap: 9 (ref 5–15)
BUN: 12 mg/dL (ref 8–23)
CO2: 23 mmol/L (ref 22–32)
Calcium: 9.3 mg/dL (ref 8.9–10.3)
Chloride: 108 mmol/L (ref 98–111)
Creatinine: 0.76 mg/dL (ref 0.44–1.00)
GFR, Est AFR Am: >60 mL/min (ref >60)
GFR, Estimated: >60 mL/min (ref >60)
Glucose, Bld: 176 mg/dL — ABNORMAL HIGH (ref 70–99)
Potassium: 4 mmol/L (ref 3.5–5.1)
Sodium: 140 mmol/L (ref 135–145)
Total Bilirubin: 0.6 mg/dL (ref 0.3–1.2)
Total Protein: 6.1 g/dL — ABNORMAL LOW (ref 6.5–8.1)

## 2020-03-26 MED ORDER — SENNOSIDES-DOCUSATE SODIUM 8.6-50 MG PO TABS: 2.0000 | ORAL_TABLET | Freq: Every day | ORAL | 2 refills | Status: AC

## 2020-03-26 MED ORDER — HEPARIN SOD (PORK) LOCK FLUSH 100 UNIT/ML IV SOLN
500.0000 [IU] | Freq: Once | INTRAVENOUS | Status: AC | PRN
  Administered 2020-03-26: 500 [IU]
  Filled 2020-03-26: qty 5

## 2020-03-26 MED ORDER — ZOLEDRONIC ACID 4 MG/100ML IV SOLN
INTRAVENOUS | Status: AC
  Filled 2020-03-26: qty 100

## 2020-03-26 MED ORDER — SODIUM CHLORIDE 0.9 % IV SOLN
Freq: Once | INTRAVENOUS | Status: AC
  Filled 2020-03-26: qty 250

## 2020-03-26 MED ORDER — DEXTROSE 5 % IV SOLN
55.0000 mg/m2 | Freq: Once | INTRAVENOUS | Status: AC
  Administered 2020-03-26: 15:00:00 100 mg via INTRAVENOUS
  Filled 2020-03-26: qty 15

## 2020-03-26 MED ORDER — FENTANYL 12 MCG/HR TD PT72: 1.0000 | MEDICATED_PATCH | TRANSDERMAL | 0 refills | Status: DC

## 2020-03-26 MED ORDER — FAMOTIDINE 20 MG PO TABS
ORAL_TABLET | ORAL | Status: AC
  Filled 2020-03-26: qty 1

## 2020-03-26 MED ORDER — DEXAMETHASONE 4 MG PO TABS
20.0000 mg | ORAL_TABLET | Freq: Once | ORAL | Status: AC
  Administered 2020-03-26: 20 mg via ORAL

## 2020-03-26 MED ORDER — ACETAMINOPHEN 325 MG PO TABS
650.0000 mg | ORAL_TABLET | Freq: Once | ORAL | Status: AC
  Administered 2020-03-26: 650 mg via ORAL

## 2020-03-26 MED ORDER — ACETAMINOPHEN 325 MG PO TABS
ORAL_TABLET | ORAL | Status: AC
  Filled 2020-03-26: qty 2

## 2020-03-26 MED ORDER — DIPHENHYDRAMINE HCL 25 MG PO CAPS
ORAL_CAPSULE | ORAL | Status: AC
  Filled 2020-03-26: qty 1

## 2020-03-26 MED ORDER — SODIUM CHLORIDE 0.9% FLUSH
10.0000 mL | INTRAVENOUS | Status: DC | PRN
  Administered 2020-03-26: 10 mL
  Filled 2020-03-26: qty 10

## 2020-03-26 MED ORDER — FAMOTIDINE 20 MG PO TABS
20.0000 mg | ORAL_TABLET | Freq: Once | ORAL | Status: AC
  Administered 2020-03-26: 20 mg via ORAL

## 2020-03-26 MED ORDER — ZOLEDRONIC ACID 4 MG/100ML IV SOLN
4.0000 mg | Freq: Once | INTRAVENOUS | Status: AC
  Administered 2020-03-26: 4 mg via INTRAVENOUS

## 2020-03-26 MED ORDER — PROCHLORPERAZINE MALEATE 10 MG PO TABS
10.0000 mg | ORAL_TABLET | Freq: Once | ORAL | Status: AC
  Administered 2020-03-26: 10 mg via ORAL

## 2020-03-26 MED ORDER — PROCHLORPERAZINE MALEATE 10 MG PO TABS
ORAL_TABLET | ORAL | Status: AC
  Filled 2020-03-26: qty 1

## 2020-03-26 MED ORDER — SODIUM CHLORIDE 0.9% FLUSH
10.0000 mL | Freq: Once | INTRAVENOUS | Status: AC
  Administered 2020-03-26: 13:00:00 10 mL
  Filled 2020-03-26: qty 10

## 2020-03-26 MED ORDER — DEXAMETHASONE 4 MG PO TABS
ORAL_TABLET | ORAL | Status: AC
  Filled 2020-03-26: qty 5

## 2020-03-26 MED ORDER — DIPHENHYDRAMINE HCL 25 MG PO TABS
25.0000 mg | ORAL_TABLET | Freq: Once | ORAL | Status: AC
  Administered 2020-03-26: 25 mg via ORAL
  Filled 2020-03-26: qty 1

## 2020-03-26 NOTE — Patient Instructions (Signed)
[facility] Discharge Instructions for Patients Receiving Chemotherapy  Today you received the following chemotherapy agents Carfilzomib (KYPROLIS).  To help prevent nausea and vomiting after your treatment, we encourage you to take your nausea medication as prescribed.  If you develop nausea and vomiting that is not controlled by your nausea medication, call the clinic.   BELOW ARE SYMPTOMS THAT SHOULD BE REPORTED IMMEDIATELY:  *FEVER GREATER THAN 100.5 F  *CHILLS WITH OR WITHOUT FEVER  NAUSEA AND VOMITING THAT IS NOT CONTROLLED WITH YOUR NAUSEA MEDICATION  *UNUSUAL SHORTNESS OF BREATH  *UNUSUAL BRUISING OR BLEEDING  TENDERNESS IN MOUTH AND THROAT WITH OR WITHOUT PRESENCE OF ULCERS  *URINARY PROBLEMS  *BOWEL PROBLEMS  UNUSUAL RASH Items with * indicate a potential emergency and should be followed up as soon as possible.  Feel free to call the clinic should you have any questions or concerns. The clinic phone number is [phone].  Please show the CHEMO ALERT CARD at check-in to the Emergency Department and triage nurse.   

## 2020-03-26 NOTE — Progress Notes (Signed)
HEMATOLOGY/ONCOLOGY CLINIC NOTE  Date of Service: 03/26/2020  Patient Care Team: Hoyt Koch, MD as PCP - General (Internal Medicine) Lafayette Dragon, MD (Inactive) as Consulting Physician (Gastroenterology) Megan Salon, MD as Consulting Physician (Gynecology) Melrose Nakayama, MD as Consulting Physician (Orthopedic Surgery) Deneise Lever, MD as Consulting Physician (Pulmonary Disease) [name], MD (Ophthalmology)  CHIEF COMPLAINTS/PURPOSE OF CONSULTATION:  Continue mx of myeloma  HISTORY OF PRESENTING ILLNESS:   Faith Orr is a wonderful 79 y.o. female who has been referred to [name] by Dr. Pricilla Holm for evaluation and management of Lytic bone lesions. She is accompanied today by her son in law, and her partner is present via phone. The pt reports that she is doing well overall.   The pt notes that 2-3 months ago while standing at the stove, and otherwise feeling normally, she felt "something snap that took her breath away" in her mid back as she stretched to get something. The pt notes that she saw a chiropractor twice due to her back stiffness, which did not help. The pt then described her new bone pains to her PCP on 12/05/18, and subsequent imaging, as noted below, revealed concerns for numerous bone lesions. She has begun 42m Fosamax. The pt notes that she had hip pain in 2018, and that an XR at that time did not reveal any lesions.  The pt notes that most of her pain is concentrated to her left shoulder presently, and with minimal movement of the arm. She endorses pain radiating into her left arm and notes that her hand is swollen in the mornings when she wakes up. She also endorses present back pain and right hip pain, worse when she walks. She has not yet seen orthopedics. The pt reports that she is needing to take 6055mAdvil every 4-6 hours as Tramadol alone has not been able to alleviate her pain. The pt denies any unexpected weight loss, fevers,  chills, or night sweats. The pt notes that her urine is a very dark color presently, but denies overt blood in the urine, underpants, nor tissue paper. The pt notes that in the last two weeks she has had some soreness in her head, but denies new headaches or changes in vision. The pt notes that she has been urinating more frequently overall, but has been trying to stay better hydrated as well.  The pt notes that she has been compliant with annual mammograms.   The pt notes that she had a cyst in her right breast which was removed in the past. She fractured her left wrist in 2008 after falling down stairs. The pt endorses history of fatty liver.   The pt denies ever smoking cigarettes and endorses significant second hand smoke exposure with a previous marriage.   Of note prior to the patient's visit today, pt has had a Bone Scan completed on 12/13/18 with results revealing Multifocal uptake throughout the skeleton, consistent with diffuse metastatic disease. Primary tumor is not specified. 2. Uptake in the proximal right femur, consistent with lytic lesions. 3. Uptake in the ribs bilaterally as described. 4. Lesions in the proximal left humerus. 5. Diffuse uptake throughout the skull consistent with metastatic disease. 6. Right paramedian uptake at the manubrium.  Most recent lab results (12/08/18) of CBC w/diff and CMP is as follows: all values are WNL except for Glucose at 279, BUN at 24, AST at 41, ALT at 46. 12/08/18 SPEP revealed all values WNL except for Total  Protein at 6.0, Albumin at 3.6, Gamma globulin at 0.7, and M spike at 0.5g  On review of systems, pt reports significant left shoulder pain, back pain, right hip pain, dark urine, and denies fevers, chills, night sweats, unexpected weight loss, changes in bowel habits, changes in breathing, cough, new respiratory symptoms, changes in vision, abdominal pains, leg swelling, and any other symptoms.   On PMHx the pt reports fatty liver, and  denies blood clots.  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 Denies ever smoking.  On Family Hx the pt reports maternal grandmother with colon cancer. Father with bladder cancer and amyloidosis (pt notes that this could have been misdiagnosis). Mother with Protein S deficiency and polymyalgia rheumatica.  Current Treatment: Carfilzomib + Revlimid maintenance.  Interval History:   Faith Orr returns today for management and evaluation of her Multiple Myeloma and C15D1 of maintenance Carfilzomib, Revlimid, and Dexamethasone. The patient's last visit with [name] was on 02/26/2020. The pt reports that she is doing well overall.  The pt reports that she has been having more whole-body fatigue. Pt has been walking for exercise and gardening in her yard. Pt is only taking Glipizide to manage her Diabetes and she has not been checking her blood glucose levels at home.   Five days after her second COVID19 vaccine pt developed a red rash around her upper right arm. The rash was itchy and resolved after a few days. She saw Dr. Alain Marion after the onset of her rash, who gave her Doxycyline and Prednisone to take. She has completed both medications.  Lab results today (03/26/20) of CBC w/diff and CMP is as follows: all values are WNL except for RBC at 3.78, MCV at 100.3, PLT at 145K, Mono Abs at 1.2K, Glucose at 176, Total Protein at 6.1, Albumin at 3.1, AST at 13.  On review of systems, pt reports night sweats, fatigue, constipation and denies new bone pain, back pain, fevers, chills, abdominal pain, rash and any other symptoms.    MEDICAL HISTORY:  Past Medical History:  Diagnosis Date  . Allergy    seasonal  . Asthma   . DEPRESSION   . DIABETES MELLITUS, TYPE II   . Diverticulosis   . HYPERLIPIDEMIA   . Macular degeneration of left eye    mild, Dr.Hecker  . Obesity, unspecified   . Osteoarthritis of both knees   . OSTEOPENIA   . Osteopenia    . URINARY INCONTINENCE     SURGICAL HISTORY: Past Surgical History:  Procedure Laterality Date  . CATARACT EXTRACTION Left 05/24/2018  . CESAREAN SECTION  01/1973  . CYSTOSCOPY/URETEROSCOPY/HOLMIUM LASER/STENT PLACEMENT Right 09/20/2019   Procedure: CYSTOSCOPY/URETEROSCOPY/HOLMIUM LASER/STENT PLACEMENT;  Surgeon: Lucas Mallow, MD;  Location: [facility];  Service: Urology;  Laterality: Right;  . FRACTURE SURGERY    . IR IMAGING GUIDED PORT INSERTION  02/20/2019  . left wrist surgery  2008   By Dr. Latanya Maudlin  . right ankle  1994    SOCIAL HISTORY: Social History   Socioeconomic History  . Marital status: Married    Spouse name: Not on file  . Number of children: 1  . Years of education: Not on file  . Highest education level: Not on file  Occupational History    Employer: [employer]  Tobacco Use  . Smoking status: Never Smoker  . Smokeless tobacco: Never Used  . Tobacco comment: Lives with partner [name])  and son  Substance and Sexual Activity  . Alcohol use: No    Alcohol/week: 0.0 standard drinks  . Drug use: No  . Sexual activity: Never    Partners: Female    Birth control/protection: Post-menopausal    Comment: Lives with female partner (annette hicks) and 48 yo son  Other Topics Concern  . Not on file  Social History Narrative  . Not on file   Social Determinants of Health   Financial Resource Strain:   . Difficulty of Paying Living Expenses:   Food Insecurity:   . Worried About Charity fundraiser in the Last Year:   . Arboriculturist in the Last Year:   Transportation Needs:   . Film/video editor (Medical):   Marland Kitchen Lack of Transportation (Non-Medical):   Physical Activity:   . Days of Exercise per Week:   . Minutes of Exercise per Session:   Stress:   . Feeling of Stress :   Social Connections:   . Frequency of Communication with Friends and Family:   . Frequency of Social Gatherings with Friends and Family:    . Attends Religious Services:   . Active Member of Clubs or Organizations:   . Attends Archivist Meetings:   Marland Kitchen Marital Status:   Intimate Partner Violence:   . Fear of Current or Ex-Partner:   . Emotionally Abused:   Marland Kitchen Physically Abused:   . Sexually Abused:     FAMILY HISTORY: Family History  Problem Relation Age of Onset  . Diabetes Father   . Hyperlipidemia Father   . Heart disease Father   . Cancer Father   . Hypertension Father   . Colon cancer Paternal Grandmother 80  . Osteoporosis Mother   . Protein S deficiency Mother   . Hyperlipidemia Mother   . Multiple sclerosis Daughter   . Cancer Other        bladder  . Breast cancer Neg Hx     ALLERGIES:  is allergic to penicillins; aleve [naproxen sodium]; and sulfonamide derivatives.  MEDICATIONS:  Current Outpatient Medications  Medication Sig Dispense Refill  . acyclovir (ZOVIRAX) 400 MG tablet TAKE 1 TABLET(400 MG) BY MOUTH TWICE DAILY 60 tablet 1  . aspirin EC 81 MG tablet Take 81 mg by mouth daily after breakfast.     . Blood Glucose Monitoring Suppl (FREESTYLE FREEDOM LITE) W/DEVICE KIT Use to check blood sugars twice a day Dx 250.00 1 each 0  . Calcium Carbonate-Vitamin D (CALCIUM 600+D HIGH POTENCY) 600-400 MG-UNIT per tablet Take 1 tablet by mouth 2 (two) times daily.     . Cetirizine HCl 10 MG CAPS Take 1 capsule (10 mg total) by mouth daily. 30 capsule 1  . dexamethasone (DECADRON) 4 MG tablet Take 5 tablets (20 mg total) by mouth once a week. On D22 of each cycle of treatment 20 tablet 5  . docusate sodium (DOK) 100 MG capsule Take 2 capsules (200 mg total) by mouth at bedtime. 60 capsule 1  . doxycycline (VIBRA-TABS) 100 MG tablet Take 1 tablet (100 mg total) by mouth 2 (two) times daily. 14 tablet 1  . fentaNYL (DURAGESIC) 12 MCG/HR Place 1 patch onto the skin every 3 (three) days. 10 patch 0  . fluticasone (FLONASE) 50 MCG/ACT nasal spray Place 1 spray into both nostrils daily. (Patient taking  differently: Place 1 spray into both nostrils daily as needed for allergies or rhinitis. ) 16 g 2  . glipiZIDE (GLUCOTROL XL) 5  MG 24 hr tablet TAKE 1 TABLET(5 MG) BY MOUTH DAILY WITH BREAKFAST 90 tablet 1  . glucose blood (FREESTYLE LITE) test strip CHECK BLOOD SUGAR TWICE DAILY AS DIRECTED Dx 250.00 180 each 3  . Lancets (FREESTYLE) lancets Use twice daily to check sugars. 100 each 11  . lenalidomide (REVLIMID) 15 MG capsule TAKE 1 CAPSULE BY MOUTH  DAILY FOR 21 DAYS ON, THEN  7 DAYS OFF 21 capsule 0  . lidocaine-prilocaine (EMLA) cream APPLY 1 APPLICATION TO THE AFFECTED AREA AS NEEDED. USE PRIOR TO PORT ACCESS 30 g 0  . LORazepam (ATIVAN) 0.5 MG tablet Take 1 tablet (0.5 mg total) by mouth every 8 (eight) hours as needed for anxiety (significant essential tremors). 60 tablet 0  . metFORMIN (GLUCOPHAGE-XR) 500 MG 24 hr tablet TAKE 3 TABLETS(1500 MG) BY MOUTH DAILY WITH BREAKFAST 270 tablet 1  . methylPREDNISolone (MEDROL DOSEPAK) 4 MG TBPK tablet As directed 21 tablet 0  . Multiple Vitamins-Minerals (ICAPS) CAPS Take 1 capsule by mouth daily after breakfast.     . ondansetron (ZOFRAN) 8 MG tablet Take 1 tablet (8 mg total) by mouth 2 (two) times daily as needed (Nausea or vomiting). 30 tablet 1  . Oxycodone HCl 10 MG TABS Take 1 tablet (10 mg total) by mouth every 6 (six) hours as needed. 90 tablet 0  . pantoprazole (PROTONIX) 20 MG tablet TAKE 1 TABLET(20 MG) BY MOUTH DAILY 30 tablet 5  . polyethylene glycol (MIRALAX / GLYCOLAX) packet Take 17 g by mouth daily after breakfast.     . potassium chloride SA (KLOR-CON) 20 MEQ tablet TAKE 1 TABLET(20 MEQ) BY MOUTH TWICE DAILY 60 tablet 1  . prochlorperazine (COMPAZINE) 10 MG tablet Take 1 tablet (10 mg total) by mouth every 6 (six) hours as needed (Nausea or vomiting). 30 tablet 1  . sertraline (ZOLOFT) 50 MG tablet TAKE 1 TABLET BY MOUTH DAILY 90 tablet 0  . simvastatin (ZOCOR) 20 MG tablet TAKE 1 TABLET(20 MG) BY MOUTH DAILY 90 tablet 0  .  Triamcinolone Acetonide 0.025 % LOTN Apply 1 application topically 3 (three) times daily as needed (rash/itching). 60 mL 2  . triamcinolone cream (KENALOG) 0.5 % Apply 1 application topically 4 (four) times daily. 90 g 1  . Vitamin D, Ergocalciferol, (DRISDOL) 1.25 MG (50000 UNIT) CAPS capsule TAKE 1 CAPSULE BY MOUTH EVERY 7 DAYS 12 capsule 0   No current facility-administered medications for this visit.   Facility-Administered Medications Ordered in Other Visits  Medication Dose Route Frequency Provider Last Rate Last Admin  . heparin lock flush 100 unit/mL  500 Units Intracatheter Once PRN Brunetta Genera, MD      . sodium chloride flush (NS) 0.9 % injection 10 mL  10 mL Intracatheter PRN Brunetta Genera, MD   10 mL at 10/02/19 1524    REVIEW OF SYSTEMS:   A 10+ POINT REVIEW OF SYSTEMS WAS OBTAINED including neurology, dermatology, psychiatry, cardiac, respiratory, lymph, extremities, GI, GU, Musculoskeletal, constitutional, breasts, reproductive, HEENT.  All pertinent positives are noted in the HPI.  All others are negative.   PHYSICAL EXAMINATION: ECOG FS:2 - Symptomatic, <50% confined to bed  Vitals:   03/26/20 1345  BP: 133/65  Pulse: 74  Resp: 17  Temp: 97.8 F (36.6 C)  SpO2: 98%   Wt Readings from Last 3 Encounters:  03/26/20 163 lb 4.8 oz (74.1 kg)  03/14/20 162 lb (73.5 kg)  02/26/20 163 lb 9.6 oz (74.2 kg)   Body mass index  is 32.98 kg/m.    Exam was given in a chair   GENERAL:alert, in no acute distress and comfortable SKIN: no acute rashes, no significant lesions EYES: conjunctiva are pink and non-injected, sclera anicteric OROPHARYNX: MMM, no exudates, no oropharyngeal erythema or ulceration NECK: supple, no JVD LYMPH:  no palpable lymphadenopathy in the cervical, axillary or inguinal regions LUNGS: clear to auscultation b/l with normal respiratory effort HEART: regular rate & rhythm ABDOMEN:  normoactive bowel sounds , non tender, not distended.  No palpable hepatosplenomegaly.  Extremity: no pedal edema PSYCH: alert & oriented x 3 with fluent speech NEURO: no focal motor/sensory deficits  LABORATORY DATA:  I have reviewed the data as listed  . CBC Latest Ref Rng & Units 03/26/2020 02/26/2020 02/12/2020  WBC 4.0 - 10.5 K/uL 5.1 5.4 5.8  Hemoglobin 12.0 - 15.0 g/dL 12.7 12.6 12.5  Hematocrit 36.0 - 46.0 % 37.9 37.2 37.3  Platelets 150 - 400 K/uL 145(L) 223 175   . CBC    Component Value Date/Time   WBC 5.1 03/26/2020 1305   RBC 3.78 (L) 03/26/2020 1305   HGB 12.7 03/26/2020 1305   HGB 12.8 02/16/2019 1138   HCT 37.9 03/26/2020 1305   PLT 145 (L) 03/26/2020 1305   PLT 170 02/16/2019 1138   MCV 100.3 (H) 03/26/2020 1305   MCH 33.6 03/26/2020 1305   MCHC 33.5 03/26/2020 1305   RDW 13.1 03/26/2020 1305   LYMPHSABS 0.9 03/26/2020 1305   MONOABS 1.2 (H) 03/26/2020 1305   EOSABS 0.1 03/26/2020 1305   BASOSABS 0.1 03/26/2020 1305    . CMP Latest Ref Rng & Units 03/26/2020 02/26/2020 02/12/2020  Glucose 70 - 99 mg/dL 176(H) 222(H) 189(H)  BUN 8 - 23 mg/dL '12 12 10  ' Creatinine 0.44 - 1.00 mg/dL 0.76 0.89 0.83  Sodium 135 - 145 mmol/L 140 141 139  Potassium 3.5 - 5.1 mmol/L 4.0 3.6 3.7  Chloride 98 - 111 mmol/L 108 110 108  CO2 22 - 32 mmol/L 23 20(L) 23  Calcium 8.9 - 10.3 mg/dL 9.3 8.8(L) 9.1  Total Protein 6.5 - 8.1 g/dL 6.1(L) 6.3(L) 6.1(L)  Total Bilirubin 0.3 - 1.2 mg/dL 0.6 0.6 0.7  Alkaline Phos 38 - 126 U/L 71 73 72  AST 15 - 41 U/L 13(L) 14(L) 13(L)  ALT 0 - 44 U/L '16 15 17   ' 09/18/2019 BM Bx Report ([id number])   09/18/2019 FISH Panel    05/30/2019 BM Bx   01/06/2019 BM Bx:     01/06/19 Cytogenetics:      05/30/19 BM Biopsy:   09/18/2019 FISH Panel    09/18/2019 BM Surgical Pathology ([id number])     RADIOGRAPHIC STUDIES: I have personally reviewed the radiological images as listed and agreed with the findings in the report. No results found.  ASSESSMENT & PLAN:   79 y.o.  female with  1. Recently diagnosed Multiple Myeloma, RISS Stage III  Labs upon initial presentation from 12/08/18, blood counts are normal including WBC at 7.1k, HGB at 13.1, and PLT at 245k. Calcium normal at 10.3. Creatinine normal at 0.63. M spike at 0.5g. 12/13/18 Bone Scan revealed Multifocal uptake throughout the skeleton, consistent with diffuse metastatic disease. Primary tumor is not specified. 2. Uptake in the proximal right femur, consistent with lytic lesions. 3. Uptake in the ribs bilaterally as described. 4. Lesions in the proximal left humerus. 5. Diffuse uptake throughout the skull consistent with metastatic disease. 6. Right paramedian uptake at the manubrium.  12/13/18 CT Right Femur  revealed Numerous lytic lesions involving the right femur and a lytic lesion in the left inferior pubic ramus. Overall appearance is most concerning for multiple myeloma  12/27/18 Pretreatment 24hour UPEP observed an M spike at 15m, and showed 19101mtotal protein/day.  12/27/18 Pretreatment MMP revealed M Protein at 0.5g with IgG Lambda specificity. Kappa:Lambda light chain ratio at 0.13, with Lambda at 40.3. There is less abnormal protein and light chains than I would expect from 30% plasma cells, which suggests hypo-secretory or non-secretory neoplastic plasma cells. Will have an impact in assessing response. 01/05/19 PET/CT revealed Innumerable lytic lesions in the skeleton compatible with myeloma. Most of the larger lesions are hypermetabolic, for example including a left proximal humeral shaft lesion with maximum SUV of 8.1 and a 2.8 cm lesion in the left T9 vertebral body with maximum SUV 5.1. Most of the smaller lytic lesions, and some of the larger lesions, do not demonstrate accentuated metabolic activity. 2. 1.2 cm in short axis lymph node in the left parapharyngeal space is hypermetabolic with maximum SUV 11.8. I do not see a separate mass in the head and neck to give rise to this hypermetabolic  lymph node. 3. Mosaic attenuation in the lower lobes, nonspecific possibly from air trapping. 4.  Aortic Atherosclerosis 5. Heterogeneous activity in the liver, making it hard to exclude small liver lesions. Consider hepatic protocol MRI with and without contrast for definitive assessment. Nonobstructive right nephrolithiasis. Old granulomatous disease  01/06/19 Bone Marrow biopsy revealed interstitial increase in plasma cells (28% aspirate, 40% CD138 immunohistochemistry). Plasma cells negative for light chains consistent with a non or weakly secretory myeloma   01/06/19 Cytogenetics revealed 37% of cells with trisomy 11 or 11q deletion, and 40.5% of cells with 17p mutation  S/p 5 cycles of KRD treatment  05/31/19 BM Biopsy revealed mild atypical plasmacytosis at 5% with polytypic variation.   06/01/19 PET/CT revealed "Dominant lesion in the LEFT humerus is decreased significantly in metabolic activity. Additional hypermetabolic skeletal lytic lesions have decreased in metabolic activity or similar to comparison exam (01/05/2019). No evidence of disease progression. 2. Multiple additional lytic lesions do not have metabolic activity and unchanged. 3. No new skeletal lesions are identified. No soft tissue plasmacytoma identified. 4. Nodule / node in the LEFT parapharyngeal space which is intensely hypermetabolic not changed from prior. 5. New hypermetabolic LEFT lower lobe pulmonary nodule is indeterminate. Recommend close attention on follow-up 6. New obstructive hydronephrosis of the RIGHT kidney related to RIGHT UPJ stone."  09/18/2019 BM Bx Report which revealed "Slightly hypercellular bone marrow for age with trilineage hematopoiesis and 1% plasma cells."  09/14/2019 PET/CT Whole Body Scan ([id number] revealed "1. There widespread tiny lytic lesions compatible with multiple myeloma. Index larger lesions are generally similar to the prior exam, with low-grade activity such as the left T9  vertebral body lesion with maximum SUV 4.5. Is mild increase in the activity associated with a mildly sclerotic left proximal humeral lesion, maximum SUV 4.8 (previously 3.5). 2. At the site of the prior left lower lobe nodule is currently more bandlike thickening, with maximum SUV only 1.9, probably benign, continued surveillance of this region suggested. 3. There several small but hypermetabolic lymph nodes. This includes a left parapharyngeal space node measuring 1.0 cm with maximum SUV 12.3 (stable); a left level IB lymph node measuring 0.5 cm with maximum SUV 4.8 (slightly larger than prior); and a left inguinal lymph node measuring 0.7 cm in short axis with maximum SUV 6.4 (previously 0.5 cm  with maximum SUV 0.6). Significance of these lymph nodes uncertain, surveillance is recommended. 4. New 5 mm left lower lobe subpleural nodule on image 32/8, not appreciably hypermetabolic, surveillance suggested. 5. Focal subcutaneous stranding along the left perineum measuring about 2.6 by 1.1 cm on image 221/4, maximum SUV 12.5. This was not present previously and is most likely inflammatory, although given the notable SUV, surveillance of this region is suggested. 6. Other imaging findings of potential clinical significance: Aortic Atherosclerosis (ICD10-I70.0). Coronary atherosclerosis. Old granulomatous disease. Mild right hydronephrosis due to a 7 mm right UPJ calculus. 2 mm right kidney upper pole nonobstructive renal calculus. Prominent stool throughout the colon favors constipation."  12/19/2019 Thoracic & Lumbar Spine MRI ([id number]) ([id number]) revealed "Suspected myeloma lesions at T9 and S1. No compression deformity or epidural disease.  01/18/2020 PET/CT ([id number]) which revealed "1. Stable lytic lesions throughout the skeleton. The larger lytic lesions which had mild metabolic activity on comparison exam now have background metabolic activity. No evidence of active myeloma. No evidence of  progression multiple myeloma.  No plasmacytoma 3. Hypermetabolic nodules in the LEFT neck may be associated deep tissues of the LEFT parotid gland. 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  01/17/19 MRI Liver revealed Several appreciable liver lesions all have benign imaging characteristics. No MRI findings of metastatic involvement of the liver. 2. Scattered bony lesions corresponding to the lytic lesions seen at PET-CT, compatible with active myeloma. 3. Aortic Atherosclerosis.  Mild cardiomegaly. 4. Diffuse hepatic steatosis.   3. Left lower lobe pulmonary nodule First seen on 06/01/19 PET/CT  PLAN: -Discussed pt labwork today, 03/26/20; all values are WNL except for RBC at 3.78, MCV at 100.3, PLT at 145K, Mono Abs at 1.2K, Glucose at 176, Total Protein at 6.1, Albumin at 3.1, AST at 13. -The pt shows no lab or clinical evidence of Mulitple Myeloma progression at this time -The pt has not prohibitive toxicities from continuing C15D1 Carfilzomib at this time. -Will continue Carflizomib at 56 mg/m^2q 2weeks for maintainence -Continue 15 mg Revlimid 3 weeks on, 1 week off for maintainence -Recommend pt continue eating potassium-rich foods -Recommend pt monitor fasting blood glucose levels at home -Refill Fentanyl Patch -Rx Colace + Senna -Will see back in 4 weeks with labs  FOLLOW UP: Please schedule next 2 cycles (4 doses) of Carfilzomib with portflush and labs MD visit in 4 weeks   The total time spent in the appt was 30 minutes and more than 50% was on counseling and direct patient cares, mx of treatment and toxicity evaluation.  All of the patient's questions were answered with apparent satisfaction. The patient knows to call the clinic with any problems, questions or concerns.    Sullivan Lone MD [place] AAHIVMS [facility] Hematology/Oncology Physician [facility]  (Office):       [phone] (Work cell):  [phone] (Fax):           [phone]  03/26/2020 2:16 PM  I, Yevette Edwards, am acting as a scribe for Dr. Sullivan Lone.   .I have reviewed the above documentation for accuracy and completeness, and I agree with the above. Brunetta Genera MD

## 2020-03-26 NOTE — Patient Instructions (Signed)

## 2020-03-28 ENCOUNTER — Telehealth: Payer: Self-pay | Admitting: Hematology

## 2020-03-28 ENCOUNTER — Encounter: Payer: Self-pay | Admitting: Hematology

## 2020-03-28 NOTE — Telephone Encounter (Signed)
Scheduled per 04/12 los, patient has been called and notified. ?

## 2020-03-29 ENCOUNTER — Other Ambulatory Visit: Payer: Self-pay | Admitting: Hematology

## 2020-04-02 NOTE — Progress Notes (Signed)
Pharmacist Chemotherapy Monitoring - Follow Up Assessment    I verify that I have reviewed each item in the below checklist:  . Regimen for the patient is scheduled for the appropriate day and plan matches scheduled date. Marland Kitchen Appropriate non-routine labs are ordered dependent on drug ordered. . If applicable, additional medications reviewed and ordered per protocol based on lifetime cumulative doses and/or treatment regimen.   Plan for follow-up and/or issues identified: No . I-vent associated with next due treatment: No . MD and/or nursing notified: No  Romualdo Bolk [facility] 04/02/2020 8:33 AM

## 2020-04-02 NOTE — Progress Notes (Signed)
Pharmacist Chemotherapy Monitoring - Follow Up Assessment    I verify that I have reviewed each item in the below checklist:  . Regimen for the patient is scheduled for the appropriate day and plan matches scheduled date. Marland Kitchen Appropriate non-routine labs are ordered dependent on drug ordered. . If applicable, additional medications reviewed and ordered per protocol based on lifetime cumulative doses and/or treatment regimen.   Plan for follow-up and/or issues identified: No . I-vent associated with next due treatment: No . MD and/or nursing notified: No  Romualdo Bolk [facility] 04/02/2020 8:52 AM

## 2020-04-08 ENCOUNTER — Inpatient Hospital Stay: Payer: Medicare PPO

## 2020-04-08 ENCOUNTER — Ambulatory Visit: Payer: Medicare PPO

## 2020-04-08 ENCOUNTER — Other Ambulatory Visit: Payer: Self-pay

## 2020-04-08 ENCOUNTER — Other Ambulatory Visit: Payer: Medicare PPO

## 2020-04-08 VITALS — BP 124/70 | HR 75 | Temp 98.0°F | Resp 17 | Wt 161.5 lb

## 2020-04-08 DIAGNOSIS — C9000 Multiple myeloma not having achieved remission: Secondary | ICD-10-CM

## 2020-04-08 DIAGNOSIS — Z95828 Presence of other vascular implants and grafts: Secondary | ICD-10-CM

## 2020-04-08 DIAGNOSIS — Z7189 Other specified counseling: Secondary | ICD-10-CM

## 2020-04-08 DIAGNOSIS — Z5111 Encounter for antineoplastic chemotherapy: Secondary | ICD-10-CM

## 2020-04-08 DIAGNOSIS — C7951 Secondary malignant neoplasm of bone: Secondary | ICD-10-CM

## 2020-04-08 DIAGNOSIS — Z5112 Encounter for antineoplastic immunotherapy: Secondary | ICD-10-CM | POA: Diagnosis not present

## 2020-04-08 LAB — CBC WITH DIFFERENTIAL/PLATELET
Abs Immature Granulocytes: 0.04 10*3/uL (ref 0.00–0.07)
Basophils Absolute: 0.1 10*3/uL (ref 0.0–0.1)
Basophils Relative: 1 %
Eosinophils Absolute: 0.4 10*3/uL (ref 0.0–0.5)
Eosinophils Relative: 7 %
HCT: 39.2 % (ref 36.0–46.0)
Hemoglobin: 13.3 g/dL (ref 12.0–15.0)
Immature Granulocytes: 1 %
Lymphocytes Relative: 20 %
Lymphs Abs: 1.1 10*3/uL (ref 0.7–4.0)
MCH: 33.3 pg (ref 26.0–34.0)
MCHC: 33.9 g/dL (ref 30.0–36.0)
MCV: 98.2 fL (ref 80.0–100.0)
Monocytes Absolute: 1.1 10*3/uL — ABNORMAL HIGH (ref 0.1–1.0)
Monocytes Relative: 19 %
Neutro Abs: 3 10*3/uL (ref 1.7–7.7)
Neutrophils Relative %: 52 %
Platelets: 155 10*3/uL (ref 150–400)
RBC: 3.99 MIL/uL (ref 3.87–5.11)
RDW: 13.2 % (ref 11.5–15.5)
WBC: 5.7 10*3/uL (ref 4.0–10.5)
nRBC: 0 % (ref 0.0–0.2)

## 2020-04-08 LAB — CMP (CANCER CENTER ONLY)
ALT: 20 U/L (ref 0–44)
AST: 15 U/L (ref 15–41)
Albumin: 3.4 g/dL — ABNORMAL LOW (ref 3.5–5.0)
Alkaline Phosphatase: 82 U/L (ref 38–126)
Anion gap: 12 (ref 5–15)
BUN: 12 mg/dL (ref 8–23)
CO2: 22 mmol/L (ref 22–32)
Calcium: 9.5 mg/dL (ref 8.9–10.3)
Chloride: 107 mmol/L (ref 98–111)
Creatinine: 0.88 mg/dL (ref 0.44–1.00)
GFR, Est AFR Am: >60 mL/min (ref >60)
GFR, Estimated: >60 mL/min (ref >60)
Glucose, Bld: 164 mg/dL — ABNORMAL HIGH (ref 70–99)
Potassium: 3.7 mmol/L (ref 3.5–5.1)
Sodium: 141 mmol/L (ref 135–145)
Total Bilirubin: 0.6 mg/dL (ref 0.3–1.2)
Total Protein: 6.3 g/dL — ABNORMAL LOW (ref 6.5–8.1)

## 2020-04-08 MED ORDER — SODIUM CHLORIDE 0.9% FLUSH
10.0000 mL | INTRAVENOUS | Status: DC | PRN
  Administered 2020-04-08: 10 mL
  Filled 2020-04-08: qty 10

## 2020-04-08 MED ORDER — PROCHLORPERAZINE MALEATE 10 MG PO TABS
ORAL_TABLET | ORAL | Status: AC
  Filled 2020-04-08: qty 1

## 2020-04-08 MED ORDER — SODIUM CHLORIDE 0.9 % IV SOLN
Freq: Once | INTRAVENOUS | Status: AC
  Filled 2020-04-08: qty 250

## 2020-04-08 MED ORDER — FAMOTIDINE 20 MG PO TABS
20.0000 mg | ORAL_TABLET | Freq: Once | ORAL | Status: AC
  Administered 2020-04-08: 20 mg via ORAL

## 2020-04-08 MED ORDER — FAMOTIDINE 20 MG PO TABS
ORAL_TABLET | ORAL | Status: AC
  Filled 2020-04-08: qty 1

## 2020-04-08 MED ORDER — ACETAMINOPHEN 325 MG PO TABS
650.0000 mg | ORAL_TABLET | Freq: Once | ORAL | Status: AC
  Administered 2020-04-08: 650 mg via ORAL

## 2020-04-08 MED ORDER — SODIUM CHLORIDE 0.9 % IV SOLN
Freq: Once | INTRAVENOUS | Status: DC
  Filled 2020-04-08: qty 250

## 2020-04-08 MED ORDER — DIPHENHYDRAMINE HCL 25 MG PO TABS
25.0000 mg | ORAL_TABLET | Freq: Once | ORAL | Status: AC
  Administered 2020-04-08: 25 mg via ORAL
  Filled 2020-04-08: qty 1

## 2020-04-08 MED ORDER — SODIUM CHLORIDE 0.9% FLUSH
10.0000 mL | Freq: Once | INTRAVENOUS | Status: AC
  Administered 2020-04-08: 10 mL
  Filled 2020-04-08: qty 10

## 2020-04-08 MED ORDER — DEXAMETHASONE 4 MG PO TABS
ORAL_TABLET | ORAL | Status: AC
  Filled 2020-04-08: qty 5

## 2020-04-08 MED ORDER — DIPHENHYDRAMINE HCL 25 MG PO CAPS
ORAL_CAPSULE | ORAL | Status: AC
  Filled 2020-04-08: qty 1

## 2020-04-08 MED ORDER — ACETAMINOPHEN 325 MG PO TABS
ORAL_TABLET | ORAL | Status: AC
  Filled 2020-04-08: qty 2

## 2020-04-08 MED ORDER — DEXTROSE 5 % IV SOLN
55.0000 mg/m2 | Freq: Once | INTRAVENOUS | Status: AC
  Administered 2020-04-08: 100 mg via INTRAVENOUS
  Filled 2020-04-08: qty 5

## 2020-04-08 MED ORDER — DEXAMETHASONE 4 MG PO TABS
20.0000 mg | ORAL_TABLET | Freq: Once | ORAL | Status: AC
  Administered 2020-04-08: 20 mg via ORAL

## 2020-04-08 MED ORDER — PROCHLORPERAZINE MALEATE 10 MG PO TABS
10.0000 mg | ORAL_TABLET | Freq: Once | ORAL | Status: AC
  Administered 2020-04-08: 10 mg via ORAL

## 2020-04-08 MED ORDER — HEPARIN SOD (PORK) LOCK FLUSH 100 UNIT/ML IV SOLN
500.0000 [IU] | Freq: Once | INTRAVENOUS | Status: AC | PRN
  Administered 2020-04-08: 500 [IU]
  Filled 2020-04-08: qty 5

## 2020-04-08 NOTE — Patient Instructions (Signed)

## 2020-04-08 NOTE — Patient Instructions (Signed)
[facility] Discharge Instructions for Patients Receiving Chemotherapy  Today you received the following chemotherapy agents:  Kyprolis  To help prevent nausea and vomiting after your treatment, we encourage you to take your nausea medication as directed.   If you develop nausea and vomiting that is not controlled by your nausea medication, call the clinic.   BELOW ARE SYMPTOMS THAT SHOULD BE REPORTED IMMEDIATELY:  *FEVER GREATER THAN 100.5 F  *CHILLS WITH OR WITHOUT FEVER  NAUSEA AND VOMITING THAT IS NOT CONTROLLED WITH YOUR NAUSEA MEDICATION  *UNUSUAL SHORTNESS OF BREATH  *UNUSUAL BRUISING OR BLEEDING  TENDERNESS IN MOUTH AND THROAT WITH OR WITHOUT PRESENCE OF ULCERS  *URINARY PROBLEMS  *BOWEL PROBLEMS  UNUSUAL RASH Items with * indicate a potential emergency and should be followed up as soon as possible.  Feel free to call the clinic should you have any questions or concerns. The clinic phone number is [phone].  Please show the CHEMO ALERT CARD at check-in to the Emergency Department and triage nurse.   

## 2020-04-09 ENCOUNTER — Other Ambulatory Visit: Payer: Self-pay | Admitting: Hematology

## 2020-04-09 ENCOUNTER — Encounter: Payer: Self-pay | Admitting: Hematology

## 2020-04-09 ENCOUNTER — Encounter: Payer: Self-pay | Admitting: Internal Medicine

## 2020-04-09 DIAGNOSIS — C9000 Multiple myeloma not having achieved remission: Secondary | ICD-10-CM

## 2020-04-12 ENCOUNTER — Other Ambulatory Visit: Payer: Self-pay | Admitting: Hematology

## 2020-04-12 DIAGNOSIS — C9000 Multiple myeloma not having achieved remission: Secondary | ICD-10-CM

## 2020-04-12 NOTE — Telephone Encounter (Signed)
Pt request refill.

## 2020-04-17 NOTE — Progress Notes (Signed)
Pharmacist Chemotherapy Monitoring - Follow Up Assessment    I verify that I have reviewed each item in the below checklist:  . Regimen for the patient is scheduled for the appropriate day and plan matches scheduled date. Marland Kitchen Appropriate non-routine labs are ordered dependent on drug ordered. . If applicable, additional medications reviewed and ordered per protocol based on lifetime cumulative doses and/or treatment regimen.   Plan for follow-up and/or issues identified: No . I-vent associated with next due treatment: No . MD and/or nursing notified: No  Shantell Belongia D 04/17/2020 3:09 PM

## 2020-04-19 ENCOUNTER — Encounter: Payer: Self-pay | Admitting: Hematology

## 2020-04-19 ENCOUNTER — Other Ambulatory Visit: Payer: Self-pay | Admitting: *Deleted

## 2020-04-19 DIAGNOSIS — C9000 Multiple myeloma not having achieved remission: Secondary | ICD-10-CM

## 2020-04-19 MED ORDER — OXYCODONE HCL 10 MG PO TABS: 10.0000 mg | ORAL_TABLET | Freq: Four times a day (QID) | ORAL | 0 refills | Status: DC | PRN

## 2020-04-19 NOTE — Telephone Encounter (Signed)
Requested refill of oxycodone

## 2020-04-22 ENCOUNTER — Other Ambulatory Visit: Payer: Medicare PPO

## 2020-04-22 ENCOUNTER — Ambulatory Visit: Payer: Medicare PPO | Admitting: Hematology

## 2020-04-22 ENCOUNTER — Ambulatory Visit: Payer: Medicare PPO

## 2020-04-23 ENCOUNTER — Inpatient Hospital Stay: Payer: Medicare PPO | Attending: Hematology

## 2020-04-23 ENCOUNTER — Inpatient Hospital Stay: Payer: Medicare PPO

## 2020-04-23 ENCOUNTER — Other Ambulatory Visit: Payer: Self-pay

## 2020-04-23 ENCOUNTER — Inpatient Hospital Stay: Payer: Medicare PPO | Admitting: Hematology

## 2020-04-23 VITALS — BP 156/68 | HR 74 | Temp 98.0°F | Resp 18 | Ht 59.0 in | Wt 162.7 lb

## 2020-04-23 DIAGNOSIS — R911 Solitary pulmonary nodule: Secondary | ICD-10-CM | POA: Insufficient documentation

## 2020-04-23 DIAGNOSIS — Z5111 Encounter for antineoplastic chemotherapy: Secondary | ICD-10-CM | POA: Diagnosis not present

## 2020-04-23 DIAGNOSIS — C7951 Secondary malignant neoplasm of bone: Secondary | ICD-10-CM

## 2020-04-23 DIAGNOSIS — Z7189 Other specified counseling: Secondary | ICD-10-CM

## 2020-04-23 DIAGNOSIS — Z95828 Presence of other vascular implants and grafts: Secondary | ICD-10-CM

## 2020-04-23 DIAGNOSIS — C9000 Multiple myeloma not having achieved remission: Secondary | ICD-10-CM

## 2020-04-23 DIAGNOSIS — Z5112 Encounter for antineoplastic immunotherapy: Secondary | ICD-10-CM | POA: Insufficient documentation

## 2020-04-23 DIAGNOSIS — F329 Major depressive disorder, single episode, unspecified: Secondary | ICD-10-CM | POA: Insufficient documentation

## 2020-04-23 DIAGNOSIS — R932 Abnormal findings on diagnostic imaging of liver and biliary tract: Secondary | ICD-10-CM | POA: Diagnosis not present

## 2020-04-23 DIAGNOSIS — Z79899 Other long term (current) drug therapy: Secondary | ICD-10-CM | POA: Insufficient documentation

## 2020-04-23 LAB — CBC WITH DIFFERENTIAL/PLATELET
Abs Immature Granulocytes: 0.01 10*3/uL (ref 0.00–0.07)
Basophils Absolute: 0.1 10*3/uL (ref 0.0–0.1)
Basophils Relative: 1 %
Eosinophils Absolute: 0.2 10*3/uL (ref 0.0–0.5)
Eosinophils Relative: 4 %
HCT: 38.2 % (ref 36.0–46.0)
Hemoglobin: 12.9 g/dL (ref 12.0–15.0)
Immature Granulocytes: 0 %
Lymphocytes Relative: 25 %
Lymphs Abs: 1 10*3/uL (ref 0.7–4.0)
MCH: 33.9 pg (ref 26.0–34.0)
MCHC: 33.8 g/dL (ref 30.0–36.0)
MCV: 100.3 fL — ABNORMAL HIGH (ref 80.0–100.0)
Monocytes Absolute: 0.9 10*3/uL (ref 0.1–1.0)
Monocytes Relative: 22 %
Neutro Abs: 2 10*3/uL (ref 1.7–7.7)
Neutrophils Relative %: 48 %
Platelets: 175 10*3/uL (ref 150–400)
RBC: 3.81 MIL/uL — ABNORMAL LOW (ref 3.87–5.11)
RDW: 13.6 % (ref 11.5–15.5)
WBC: 4.2 10*3/uL (ref 4.0–10.5)
nRBC: 0 % (ref 0.0–0.2)

## 2020-04-23 LAB — CMP (CANCER CENTER ONLY)
ALT: 16 U/L (ref 0–44)
AST: 12 U/L — ABNORMAL LOW (ref 15–41)
Albumin: 3.4 g/dL — ABNORMAL LOW (ref 3.5–5.0)
Alkaline Phosphatase: 76 U/L (ref 38–126)
Anion gap: 9 (ref 5–15)
BUN: 13 mg/dL (ref 8–23)
CO2: 23 mmol/L (ref 22–32)
Calcium: 9.6 mg/dL (ref 8.9–10.3)
Chloride: 108 mmol/L (ref 98–111)
Creatinine: 0.78 mg/dL (ref 0.44–1.00)
GFR, Est AFR Am: >60 mL/min (ref >60)
GFR, Estimated: >60 mL/min (ref >60)
Glucose, Bld: 145 mg/dL — ABNORMAL HIGH (ref 70–99)
Potassium: 4 mmol/L (ref 3.5–5.1)
Sodium: 140 mmol/L (ref 135–145)
Total Bilirubin: 0.6 mg/dL (ref 0.3–1.2)
Total Protein: 6.4 g/dL — ABNORMAL LOW (ref 6.5–8.1)

## 2020-04-23 MED ORDER — ZOLEDRONIC ACID 4 MG/100ML IV SOLN
4.0000 mg | Freq: Once | INTRAVENOUS | Status: AC
  Administered 2020-04-23: 4 mg via INTRAVENOUS

## 2020-04-23 MED ORDER — ACETAMINOPHEN 325 MG PO TABS
ORAL_TABLET | ORAL | Status: AC
  Filled 2020-04-23: qty 2

## 2020-04-23 MED ORDER — DIPHENHYDRAMINE HCL 25 MG PO CAPS
ORAL_CAPSULE | ORAL | Status: AC
  Filled 2020-04-23: qty 1

## 2020-04-23 MED ORDER — SODIUM CHLORIDE 0.9% FLUSH
10.0000 mL | Freq: Once | INTRAVENOUS | Status: AC
  Administered 2020-04-23: 10 mL
  Filled 2020-04-23: qty 10

## 2020-04-23 MED ORDER — DIPHENHYDRAMINE HCL 25 MG PO CAPS
25.0000 mg | ORAL_CAPSULE | Freq: Once | ORAL | Status: AC
  Administered 2020-04-23: 25 mg via ORAL

## 2020-04-23 MED ORDER — SODIUM CHLORIDE 0.9% FLUSH
10.0000 mL | INTRAVENOUS | Status: DC | PRN
  Administered 2020-04-23: 10 mL
  Filled 2020-04-23: qty 10

## 2020-04-23 MED ORDER — DEXTROSE 5 % IV SOLN
55.0000 mg/m2 | Freq: Once | INTRAVENOUS | Status: AC
  Administered 2020-04-23: 100 mg via INTRAVENOUS
  Filled 2020-04-23: qty 30

## 2020-04-23 MED ORDER — SODIUM CHLORIDE 0.9 % IV SOLN
Freq: Once | INTRAVENOUS | Status: AC
  Filled 2020-04-23: qty 250

## 2020-04-23 MED ORDER — DEXAMETHASONE 4 MG PO TABS
20.0000 mg | ORAL_TABLET | Freq: Once | ORAL | Status: AC
  Administered 2020-04-23: 20 mg via ORAL

## 2020-04-23 MED ORDER — PROCHLORPERAZINE MALEATE 10 MG PO TABS
10.0000 mg | ORAL_TABLET | Freq: Once | ORAL | Status: AC
  Administered 2020-04-23: 10 mg via ORAL

## 2020-04-23 MED ORDER — DEXAMETHASONE 4 MG PO TABS
ORAL_TABLET | ORAL | Status: AC
  Filled 2020-04-23: qty 5

## 2020-04-23 MED ORDER — FAMOTIDINE 20 MG PO TABS
20.0000 mg | ORAL_TABLET | Freq: Once | ORAL | Status: AC
  Administered 2020-04-23: 20 mg via ORAL

## 2020-04-23 MED ORDER — FAMOTIDINE 20 MG PO TABS
ORAL_TABLET | ORAL | Status: AC
  Filled 2020-04-23: qty 1

## 2020-04-23 MED ORDER — ZOLEDRONIC ACID 4 MG/100ML IV SOLN
INTRAVENOUS | Status: AC
  Filled 2020-04-23: qty 100

## 2020-04-23 MED ORDER — HEPARIN SOD (PORK) LOCK FLUSH 100 UNIT/ML IV SOLN
500.0000 [IU] | Freq: Once | INTRAVENOUS | Status: AC | PRN
  Administered 2020-04-23: 500 [IU]
  Filled 2020-04-23: qty 5

## 2020-04-23 MED ORDER — ACETAMINOPHEN 325 MG PO TABS
650.0000 mg | ORAL_TABLET | Freq: Once | ORAL | Status: AC
  Administered 2020-04-23: 650 mg via ORAL

## 2020-04-23 MED ORDER — PROCHLORPERAZINE MALEATE 10 MG PO TABS
ORAL_TABLET | ORAL | Status: AC
  Filled 2020-04-23: qty 1

## 2020-04-23 NOTE — Progress Notes (Signed)
HEMATOLOGY/ONCOLOGY CLINIC NOTE  Date of Service: 04/23/2020  Patient Care Team: Hoyt Koch, MD as PCP - General (Internal Medicine) Lafayette Dragon, MD (Inactive) as Consulting Physician (Gastroenterology) Megan Salon, MD as Consulting Physician (Gynecology) Melrose Nakayama, MD as Consulting Physician (Orthopedic Surgery) Deneise Lever, MD as Consulting Physician (Pulmonary Disease) [name], MD (Ophthalmology)  CHIEF COMPLAINTS/PURPOSE OF CONSULTATION:  Continue mx of myeloma  HISTORY OF PRESENTING ILLNESS:   Faith Orr is a wonderful 79 y.o. female who has been referred to [name] by Dr. Pricilla Holm for evaluation and management of Lytic bone lesions. She is accompanied today by her son in law, and her partner is present via phone. The pt reports that she is doing well overall.   The pt notes that 2-3 months ago while standing at the stove, and otherwise feeling normally, she felt "something snap that took her breath away" in her mid back as she stretched to get something. The pt notes that she saw a chiropractor twice due to her back stiffness, which did not help. The pt then described her new bone pains to her PCP on 12/05/18, and subsequent imaging, as noted below, revealed concerns for numerous bone lesions. She has begun 45m Fosamax. The pt notes that she had hip pain in 2018, and that an XR at that time did not reveal any lesions.  The pt notes that most of her pain is concentrated to her left shoulder presently, and with minimal movement of the arm. She endorses pain radiating into her left arm and notes that her hand is swollen in the mornings when she wakes up. She also endorses present back pain and right hip pain, worse when she walks. She has not yet seen orthopedics. The pt reports that she is needing to take 6072mAdvil every 4-6 hours as Tramadol alone has not been able to alleviate her pain. The pt denies any unexpected weight loss, fevers,  chills, or night sweats. The pt notes that her urine is a very dark color presently, but denies overt blood in the urine, underpants, nor tissue paper. The pt notes that in the last two weeks she has had some soreness in her head, but denies new headaches or changes in vision. The pt notes that she has been urinating more frequently overall, but has been trying to stay better hydrated as well.  The pt notes that she has been compliant with annual mammograms.   The pt notes that she had a cyst in her right breast which was removed in the past. She fractured her left wrist in 2008 after falling down stairs. The pt endorses history of fatty liver.   The pt denies ever smoking cigarettes and endorses significant second hand smoke exposure with a previous marriage.   Of note prior to the patient's visit today, pt has had a Bone Scan completed on 12/13/18 with results revealing Multifocal uptake throughout the skeleton, consistent with diffuse metastatic disease. Primary tumor is not specified. 2. Uptake in the proximal right femur, consistent with lytic lesions. 3. Uptake in the ribs bilaterally as described. 4. Lesions in the proximal left humerus. 5. Diffuse uptake throughout the skull consistent with metastatic disease. 6. Right paramedian uptake at the manubrium.  Most recent lab results (12/08/18) of CBC w/diff and CMP is as follows: all values are WNL except for Glucose at 279, BUN at 24, AST at 41, ALT at 46. 12/08/18 SPEP revealed all values WNL except for Total  Protein at 6.0, Albumin at 3.6, Gamma globulin at 0.7, and M spike at 0.5g  On review of systems, pt reports significant left shoulder pain, back pain, right hip pain, dark urine, and denies fevers, chills, night sweats, unexpected weight loss, changes in bowel habits, changes in breathing, cough, new respiratory symptoms, changes in vision, abdominal pains, leg swelling, and any other symptoms.   On PMHx the pt reports fatty liver, and  denies blood clots.  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 Denies ever smoking.  On Family Hx the pt reports maternal grandmother with colon cancer. Father with bladder cancer and amyloidosis (pt notes that this could have been misdiagnosis). Mother with Protein S deficiency and polymyalgia rheumatica.  Current Treatment: Carfilzomib + Revlimid maintenance.  Interval History:  [name] returns today for management and evaluation of her Multiple Myeloma and C16D1 of maintenance Carfilzomib, Revlimid, and Dexamethasone. The patient's last visit with [name] was on 03/26/2020. The pt reports that she is doing well overall.  The pt reports that she has had a significant increase in fatigue since her last treatment. It took about 5-7 days after treatment for her to feel close to her baseline. She had her second COVID19 vaccine towards the end of March. Pt recently went on a vacation to the beach, which was also energetically costly. She is able to do the things that she needs to do, but may need to break the activity up. She has also been sleeping a significant amount during the day, as well as at night.   Pt has noticed some cognitive changes, especially when it comes to planning and organization. Pt acknowledges that there may be some element of depression after dealing with her wife's medical issues, as well as the ongoing reality of her Multiple Myeloma diagnosis. She has been on a 50 mg dose of Sertraline for years.   Her Diabetes is been well-controlled at this time.   Lab results today (04/23/20) of CBC w/diff and CMP is as follows: all values are WNL except for RBC at 3.81, MCV at 100.3, Glucose at 145, Total Protein at 6.4, Albumin at 3.4, AST at 12. 04/23/2020 MMP - no M spike 04/23/2020 K/L light chains- WNL  On review of systems, pt reports fatigue, cognitive issues, depressive moods and denies fevers, chills, new bone pain, rashes and  any other symptoms.    MEDICAL HISTORY:  Past Medical History:  Diagnosis Date  . Allergy    seasonal  . Asthma   . DEPRESSION   . DIABETES MELLITUS, TYPE II   . Diverticulosis   . HYPERLIPIDEMIA   . Macular degeneration of left eye    mild, Dr.Hecker  . Obesity, unspecified   . Osteoarthritis of both knees   . OSTEOPENIA   . Osteopenia   . URINARY INCONTINENCE     SURGICAL HISTORY: Past Surgical History:  Procedure Laterality Date  . CATARACT EXTRACTION Left 05/24/2018  . CESAREAN SECTION  01/1973  . CYSTOSCOPY/URETEROSCOPY/HOLMIUM LASER/STENT PLACEMENT Right 09/20/2019   Procedure: CYSTOSCOPY/URETEROSCOPY/HOLMIUM LASER/STENT PLACEMENT;  Surgeon: Lucas Mallow, MD;  Location: [facility];  Service: Urology;  Laterality: Right;  . FRACTURE SURGERY    . IR IMAGING GUIDED PORT INSERTION  02/20/2019  . left wrist surgery  2008   By Dr. Latanya Maudlin  . right ankle  1994    SOCIAL HISTORY: Social History   Socioeconomic History  . Marital status: Married  Spouse name: Not on file  . Number of children: 1  . Years of education: Not on file  . Highest education level: Not on file  Occupational History    Employer: [employer]  Tobacco Use  . Smoking status: Never Smoker  . Smokeless tobacco: Never Used  . Tobacco comment: Lives with partner [name]) and son  Substance and Sexual Activity  . Alcohol use: No    Alcohol/week: 0.0 standard drinks  . Drug use: No  . Sexual activity: Never    Partners: Female    Birth control/protection: Post-menopausal    Comment: Lives with female partner (annette hicks) and 41 yo son  Other Topics Concern  . Not on file  Social History Narrative  . Not on file   Social Determinants of Health   Financial Resource Strain:   . Difficulty of Paying Living Expenses:   Food Insecurity:   . Worried About Charity fundraiser in the Last Year:   . Arboriculturist in the Last Year:   Transportation  Needs:   . Film/video editor (Medical):   Marland Kitchen Lack of Transportation (Non-Medical):   Physical Activity:   . Days of Exercise per Week:   . Minutes of Exercise per Session:   Stress:   . Feeling of Stress :   Social Connections:   . Frequency of Communication with Friends and Family:   . Frequency of Social Gatherings with Friends and Family:   . Attends Religious Services:   . Active Member of Clubs or Organizations:   . Attends Archivist Meetings:   Marland Kitchen Marital Status:   Intimate Partner Violence:   . Fear of Current or Ex-Partner:   . Emotionally Abused:   Marland Kitchen Physically Abused:   . Sexually Abused:     FAMILY HISTORY: Family History  Problem Relation Age of Onset  . Diabetes Father   . Hyperlipidemia Father   . Heart disease Father   . Cancer Father   . Hypertension Father   . Colon cancer Paternal Grandmother 8  . Osteoporosis Mother   . Protein S deficiency Mother   . Hyperlipidemia Mother   . Multiple sclerosis Daughter   . Cancer Other        bladder  . Breast cancer Neg Hx     ALLERGIES:  is allergic to penicillins; aleve [naproxen sodium]; and sulfonamide derivatives.  MEDICATIONS:  Current Outpatient Medications  Medication Sig Dispense Refill  . acyclovir (ZOVIRAX) 400 MG tablet TAKE 1 TABLET(400 MG) BY MOUTH TWICE DAILY 60 tablet 1  . aspirin EC 81 MG tablet Take 81 mg by mouth daily after breakfast.     . Blood Glucose Monitoring Suppl (FREESTYLE FREEDOM LITE) W/DEVICE KIT Use to check blood sugars twice a day Dx 250.00 1 each 0  . Calcium Carbonate-Vitamin D (CALCIUM 600+D HIGH POTENCY) 600-400 MG-UNIT per tablet Take 1 tablet by mouth 2 (two) times daily.     . Cetirizine HCl 10 MG CAPS Take 1 capsule (10 mg total) by mouth daily. 30 capsule 1  . dexamethasone (DECADRON) 4 MG tablet Take 5 tablets (20 mg total) by mouth once a week. On D22 of each cycle of treatment 20 tablet 5  . doxycycline (VIBRA-TABS) 100 MG tablet Take 1 tablet (100  mg total) by mouth 2 (two) times daily. 14 tablet 1  . fentaNYL (DURAGESIC) 12 MCG/HR Place 1 patch onto the skin every 3 (three) days. 10 patch 0  .  fluticasone (FLONASE) 50 MCG/ACT nasal spray Place 1 spray into both nostrils daily. (Patient taking differently: Place 1 spray into both nostrils daily as needed for allergies or rhinitis. ) 16 g 2  . glipiZIDE (GLUCOTROL XL) 5 MG 24 hr tablet TAKE 1 TABLET(5 MG) BY MOUTH DAILY WITH BREAKFAST 90 tablet 1  . glucose blood (FREESTYLE LITE) test strip CHECK BLOOD SUGAR TWICE DAILY AS DIRECTED Dx 250.00 180 each 3  . Lancets (FREESTYLE) lancets Use twice daily to check sugars. 100 each 11  . lenalidomide (REVLIMID) 15 MG capsule TAKE 1 CAPSULE BY MOUTH  DAILY FOR 21 DAYS ON, THEN  7 DAYS OFF 21 capsule 0  . lidocaine-prilocaine (EMLA) cream APPLY 1 APPLICATION TO THE AFFECTED AREA AS NEEDED. USE PRIOR TO PORT ACCESS 30 g 0  . LORazepam (ATIVAN) 0.5 MG tablet Take 1 tablet (0.5 mg total) by mouth every 8 (eight) hours as needed for anxiety (significant essential tremors). 60 tablet 0  . metFORMIN (GLUCOPHAGE-XR) 500 MG 24 hr tablet TAKE 3 TABLETS(1500 MG) BY MOUTH DAILY WITH BREAKFAST 270 tablet 1  . methylPREDNISolone (MEDROL DOSEPAK) 4 MG TBPK tablet As directed 21 tablet 0  . Multiple Vitamins-Minerals (ICAPS) CAPS Take 1 capsule by mouth daily after breakfast.     . ondansetron (ZOFRAN) 8 MG tablet Take 1 tablet (8 mg total) by mouth 2 (two) times daily as needed (Nausea or vomiting). 30 tablet 1  . Oxycodone HCl 10 MG TABS Take 1 tablet (10 mg total) by mouth every 6 (six) hours as needed. 90 tablet 0  . pantoprazole (PROTONIX) 20 MG tablet TAKE 1 TABLET(20 MG) BY MOUTH DAILY 30 tablet 5  . polyethylene glycol (MIRALAX / GLYCOLAX) packet Take 17 g by mouth daily after breakfast.     . potassium chloride SA (KLOR-CON) 20 MEQ tablet TAKE 1 TABLET(20 MEQ) BY MOUTH TWICE DAILY 60 tablet 1  . prochlorperazine (COMPAZINE) 10 MG tablet Take 1 tablet (10 mg  total) by mouth every 6 (six) hours as needed (Nausea or vomiting). 30 tablet 1  . senna-docusate (SENNA S) 8.6-50 MG tablet Take 2 tablets by mouth at bedtime. 60 tablet 2  . sertraline (ZOLOFT) 50 MG tablet TAKE 1 TABLET BY MOUTH DAILY 90 tablet 0  . simvastatin (ZOCOR) 20 MG tablet TAKE 1 TABLET(20 MG) BY MOUTH DAILY 90 tablet 0  . Triamcinolone Acetonide 0.025 % LOTN Apply 1 application topically 3 (three) times daily as needed (rash/itching). 60 mL 2  . triamcinolone cream (KENALOG) 0.5 % Apply 1 application topically 4 (four) times daily. 90 g 1  . Vitamin D, Ergocalciferol, (DRISDOL) 1.25 MG (50000 UNIT) CAPS capsule TAKE 1 CAPSULE BY MOUTH EVERY 7 DAYS 12 capsule 0   No current facility-administered medications for this visit.   Facility-Administered Medications Ordered in Other Visits  Medication Dose Route Frequency Provider Last Rate Last Admin  . heparin lock flush 100 unit/mL  500 Units Intracatheter Once PRN Brunetta Genera, MD      . sodium chloride flush (NS) 0.9 % injection 10 mL  10 mL Intracatheter PRN Brunetta Genera, MD   10 mL at 10/02/19 1524    REVIEW OF SYSTEMS:   A 10+ POINT REVIEW OF SYSTEMS WAS OBTAINED including neurology, dermatology, psychiatry, cardiac, respiratory, lymph, extremities, GI, GU, Musculoskeletal, constitutional, breasts, reproductive, HEENT.  All pertinent positives are noted in the HPI.  All others are negative.   PHYSICAL EXAMINATION: ECOG FS:2 - Symptomatic, <50% confined to bed  Vitals:  04/23/20 1433  BP: (!) 156/68  Pulse: 74  Resp: 18  Temp: 98 F (36.7 C)  SpO2: 96%   Wt Readings from Last 3 Encounters:  04/23/20 162 lb 11.2 oz (73.8 kg)  04/08/20 161 lb 8 oz (73.3 kg)  03/26/20 163 lb 4.8 oz (74.1 kg)   Body mass index is 32.86 kg/m.    Exam was given in a chair   GENERAL:alert, in no acute distress and comfortable SKIN: no acute rashes, no significant lesions EYES: conjunctiva are pink and non-injected,  sclera anicteric OROPHARYNX: MMM, no exudates, no oropharyngeal erythema or ulceration NECK: supple, no JVD LYMPH:  no palpable lymphadenopathy in the cervical, axillary or inguinal regions LUNGS: clear to auscultation b/l with normal respiratory effort HEART: regular rate & rhythm ABDOMEN:  normoactive bowel sounds , non tender, not distended. No palpable hepatosplenomegaly.  Extremity: no pedal edema PSYCH: alert & oriented x 3 with fluent speech NEURO: no focal motor/sensory deficits  LABORATORY DATA:  I have reviewed the data as listed  . CBC Latest Ref Rng & Units 04/23/2020 04/08/2020 03/26/2020  WBC 4.0 - 10.5 K/uL 4.2 5.7 5.1  Hemoglobin 12.0 - 15.0 g/dL 12.9 13.3 12.7  Hematocrit 36.0 - 46.0 % 38.2 39.2 37.9  Platelets 150 - 400 K/uL 175 155 145(L)   . CBC    Component Value Date/Time   WBC 4.2 04/23/2020 1350   RBC 3.81 (L) 04/23/2020 1350   HGB 12.9 04/23/2020 1350   HGB 12.8 02/16/2019 1138   HCT 38.2 04/23/2020 1350   PLT 175 04/23/2020 1350   PLT 170 02/16/2019 1138   MCV 100.3 (H) 04/23/2020 1350   MCH 33.9 04/23/2020 1350   MCHC 33.8 04/23/2020 1350   RDW 13.6 04/23/2020 1350   LYMPHSABS 1.0 04/23/2020 1350   MONOABS 0.9 04/23/2020 1350   EOSABS 0.2 04/23/2020 1350   BASOSABS 0.1 04/23/2020 1350    . CMP Latest Ref Rng & Units 04/23/2020 04/08/2020 03/26/2020  Glucose 70 - 99 mg/dL 145(H) 164(H) 176(H)  BUN 8 - 23 mg/dL _0 Creatinine 0.44 - 1.00 mg/dL 0.78 0.88 0.76  Sodium 135 - 145 mmol/L 140 141 140  Potassium 3.5 - 5.1 mmol/L 4.0 3.7 4.0  Chloride 98 - 111 mmol/L 108 107 108  CO2 22 - 32 mmol/L _1 Calcium 8.9 - 10.3 mg/dL 9.6 9.5 9.3  Total Protein 6.5 - 8.1 g/dL 6.4(L) 6.3(L) 6.1(L)  Total Bilirubin 0.3 - 1.2 mg/dL 0.6 0.6 0.6  Alkaline Phos 38 - 126 U/L 76 82 71  AST 15 - 41 U/L 12(L) 15 13(L)  ALT 0 - 44 U/L _2 09/18/2019 BM Bx Report ([id number])   09/18/2019 FISH Panel    05/30/2019 BM Bx   01/06/2019 BM  Bx:     01/06/19 Cytogenetics:      05/30/19 BM Biopsy:   09/18/2019 FISH Panel    09/18/2019 BM Surgical Pathology ([id number])     RADIOGRAPHIC STUDIES: I have personally reviewed the radiological images as listed and agreed with the findings in the report. No results found.  ASSESSMENT & PLAN:   79 y.o. female with  1. Recently diagnosed Multiple Myeloma, RISS Stage III  Labs upon initial presentation from 12/08/18, blood counts are normal including WBC at 7.1k, HGB at 13.1, and PLT at 245k. Calcium normal at 10.3. Creatinine normal at 0.63. M spike at 0.5g. 12/13/18 Bone Scan revealed Multifocal uptake throughout the skeleton, consistent with  diffuse metastatic disease. Primary tumor is not specified. 2. Uptake in the proximal right femur, consistent with lytic lesions. 3. Uptake in the ribs bilaterally as described. 4. Lesions in the proximal left humerus. 5. Diffuse uptake throughout the skull consistent with metastatic disease. 6. Right paramedian uptake at the manubrium.  12/13/18 CT Right Femur revealed Numerous lytic lesions involving the right femur and a lytic lesion in the left inferior pubic ramus. Overall appearance is most concerning for multiple myeloma  12/27/18 Pretreatment 24hour UPEP observed an M spike at 34m, and showed 1938mtotal protein/day.  12/27/18 Pretreatment MMP revealed M Protein at 0.5g with IgG Lambda specificity. Kappa:Lambda light chain ratio at 0.13, with Lambda at 40.3. There is less abnormal protein and light chains than I would expect from 30% plasma cells, which suggests hypo-secretory or non-secretory neoplastic plasma cells. Will have an impact in assessing response. 01/05/19 PET/CT revealed Innumerable lytic lesions in the skeleton compatible with myeloma. Most of the larger lesions are hypermetabolic, for example including a left proximal humeral shaft lesion with maximum SUV of 8.1 and a 2.8 cm lesion in the left T9 vertebral body  with maximum SUV 5.1. Most of the smaller lytic lesions, and some of the larger lesions, do not demonstrate accentuated metabolic activity. 2. 1.2 cm in short axis lymph node in the left parapharyngeal space is hypermetabolic with maximum SUV 11.8. I do not see a separate mass in the head and neck to give rise to this hypermetabolic lymph node. 3. Mosaic attenuation in the lower lobes, nonspecific possibly from air trapping. 4.  Aortic Atherosclerosis 5. Heterogeneous activity in the liver, making it hard to exclude small liver lesions. Consider hepatic protocol MRI with and without contrast for definitive assessment. Nonobstructive right nephrolithiasis. Old granulomatous disease  01/06/19 Bone Marrow biopsy revealed interstitial increase in plasma cells (28% aspirate, 40% CD138 immunohistochemistry). Plasma cells negative for light chains consistent with a non or weakly secretory myeloma   01/06/19 Cytogenetics revealed 37% of cells with trisomy 11 or 11q deletion, and 40.5% of cells with 17p mutation  S/p 5 cycles of KRD treatment  05/31/19 BM Biopsy revealed mild atypical plasmacytosis at 5% with polytypic variation.   06/01/19 PET/CT revealed "Dominant lesion in the LEFT humerus is decreased significantly in metabolic activity. Additional hypermetabolic skeletal lytic lesions have decreased in metabolic activity or similar to comparison exam (01/05/2019). No evidence of disease progression. 2. Multiple additional lytic lesions do not have metabolic activity and unchanged. 3. No new skeletal lesions are identified. No soft tissue plasmacytoma identified. 4. Nodule / node in the LEFT parapharyngeal space which is intensely hypermetabolic not changed from prior. 5. New hypermetabolic LEFT lower lobe pulmonary nodule is indeterminate. Recommend close attention on follow-up 6. New obstructive hydronephrosis of the RIGHT kidney related to RIGHT UPJ stone."  09/18/2019 BM Bx Report which revealed "Slightly  hypercellular bone marrow for age with trilineage hematopoiesis and 1% plasma cells."  09/14/2019 PET/CT Whole Body Scan ([id number] revealed "1. There widespread tiny lytic lesions compatible with multiple myeloma. Index larger lesions are generally similar to the prior exam, with low-grade activity such as the left T9 vertebral body lesion with maximum SUV 4.5. Is mild increase in the activity associated with a mildly sclerotic left proximal humeral lesion, maximum SUV 4.8 (previously 3.5). 2. At the site of the prior left lower lobe nodule is currently more bandlike thickening, with maximum SUV only 1.9, probably benign, continued surveillance of this region suggested. 3. There several  small but hypermetabolic lymph nodes. This includes a left parapharyngeal space node measuring 1.0 cm with maximum SUV 12.3 (stable); a left level IB lymph node measuring 0.5 cm with maximum SUV 4.8 (slightly larger than prior); and a left inguinal lymph node measuring 0.7 cm in short axis with maximum SUV 6.4 (previously 0.5 cm with maximum SUV 0.6). Significance of these lymph nodes uncertain, surveillance is recommended. 4. New 5 mm left lower lobe subpleural nodule on image 32/8, not appreciably hypermetabolic, surveillance suggested. 5. Focal subcutaneous stranding along the left perineum measuring about 2.6 by 1.1 cm on image 221/4, maximum SUV 12.5. This was not present previously and is most likely inflammatory, although given the notable SUV, surveillance of this region is suggested. 6. Other imaging findings of potential clinical significance: Aortic Atherosclerosis (ICD10-I70.0). Coronary atherosclerosis. Old granulomatous disease. Mild right hydronephrosis due to a 7 mm right UPJ calculus. 2 mm right kidney upper pole nonobstructive renal calculus. Prominent stool throughout the colon favors constipation."  12/19/2019 Thoracic & Lumbar Spine MRI ([id number]) ([id number]) revealed "Suspected myeloma lesions at  T9 and S1. No compression deformity or epidural disease.  01/18/2020 PET/CT ([id number]) which revealed "1. Stable lytic lesions throughout the skeleton. The larger lytic lesions which had mild metabolic activity on comparison exam now have background metabolic activity. No evidence of active myeloma. No evidence of progression multiple myeloma.  No plasmacytoma 3. Hypermetabolic nodules in the LEFT neck may be associated deep tissues of the LEFT parotid gland. 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  01/17/19 MRI Liver revealed Several appreciable liver lesions all have benign imaging characteristics. No MRI findings of metastatic involvement of the liver. 2. Scattered bony lesions corresponding to the lytic lesions seen at PET-CT, compatible with active myeloma. 3. Aortic Atherosclerosis.  Mild cardiomegaly. 4. Diffuse hepatic steatosis.   3. Left lower lobe pulmonary nodule First seen on 06/01/19 PET/CT  PLAN: -Discussed pt labwork today, 04/23/20; blood counts and chemistries are steady.  -Discussed 04/23/2020 MMP is in progress -Discussed 04/23/2020 K/L light chains are in progress -The pt shows no lab or clinical evidence of Multiple Myeloma progression at this time.  -Pt has a non-secretory Multiple Myeloma. Will need to continue to repeat scans to monitor disease. -Advised pt that we could increase dose of Zoloft to assist with mood if she feels it is necessary. 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 -Will continue Carflizomib at 56 mg/m^2q 2weeks for maintainence -Continue 15 mg Revlimid 3 weeks on, 1 week off for maintainence  -Will see back in 4 weeks with labs    FOLLOW UP: Please schedule next 2 cycles (4 doses) of Carfilzomib with  portflush and labs MD visit in 4 weeks   The total time spent in the appt was 30 minutes and more than 50% was on counseling and direct patient cares.  All of the patient's questions were answered with apparent satisfaction. The patient knows to call the clinic with any problems, questions or concerns.    Sullivan Lone MD [place] AAHIVMS [facility] Hematology/Oncology Physician [facility]  (Office):       [phone] (Work cell):  [phone] (Fax):           [phone]  04/23/2020 3:37 PM  I, Yevette Edwards, am acting as a scribe for Dr. Suzan Slick  Kendricks Reap.   .I have reviewed the above documentation for accuracy and completeness, and I agree with the above. Brunetta Genera MD

## 2020-04-23 NOTE — Patient Instructions (Signed)

## 2020-04-23 NOTE — Patient Instructions (Signed)
[facility] Discharge Instructions for Patients Receiving Chemotherapy  Today you received the following chemotherapy agents:  Kyprolis  To help prevent nausea and vomiting after your treatment, we encourage you to take your nausea medication as directed.   If you develop nausea and vomiting that is not controlled by your nausea medication, call the clinic.   BELOW ARE SYMPTOMS THAT SHOULD BE REPORTED IMMEDIATELY:  *FEVER GREATER THAN 100.5 F  *CHILLS WITH OR WITHOUT FEVER  NAUSEA AND VOMITING THAT IS NOT CONTROLLED WITH YOUR NAUSEA MEDICATION  *UNUSUAL SHORTNESS OF BREATH  *UNUSUAL BRUISING OR BLEEDING  TENDERNESS IN MOUTH AND THROAT WITH OR WITHOUT PRESENCE OF ULCERS  *URINARY PROBLEMS  *BOWEL PROBLEMS  UNUSUAL RASH Items with * indicate a potential emergency and should be followed up as soon as possible.  Feel free to call the clinic should you have any questions or concerns. The clinic phone number is [phone].  Please show the CHEMO ALERT CARD at check-in to the Emergency Department and triage nurse.   

## 2020-04-24 ENCOUNTER — Other Ambulatory Visit: Payer: Self-pay | Admitting: Internal Medicine

## 2020-04-24 LAB — MULTIPLE MYELOMA PANEL, SERUM
Albumin SerPl Elph-Mcnc: 3.3 g/dL (ref 2.9–4.4)
Albumin/Glob SerPl: 1.3 (ref 0.7–1.7)
Alpha 1: 0.2 g/dL (ref 0.0–0.4)
Alpha2 Glob SerPl Elph-Mcnc: 0.8 g/dL (ref 0.4–1.0)
B-Globulin SerPl Elph-Mcnc: 1.1 g/dL (ref 0.7–1.3)
Gamma Glob SerPl Elph-Mcnc: 0.6 g/dL (ref 0.4–1.8)
Globulin, Total: 2.6 g/dL (ref 2.2–3.9)
IgA: 184 mg/dL (ref 64–422)
IgG (Immunoglobin G), Serum: 525 mg/dL — ABNORMAL LOW (ref 586–1602)
IgM (Immunoglobulin M), Srm: 13 mg/dL — ABNORMAL LOW (ref 26–217)
Total Protein ELP: 5.9 g/dL — ABNORMAL LOW (ref 6.0–8.5)

## 2020-04-24 LAB — KAPPA/LAMBDA LIGHT CHAINS
Kappa free light chain: 16.4 mg/L (ref 3.3–19.4)
Kappa, lambda light chain ratio: 1.34 (ref 0.26–1.65)
Lambda free light chains: 12.2 mg/L (ref 5.7–26.3)

## 2020-04-25 ENCOUNTER — Encounter: Payer: Self-pay | Admitting: Hematology

## 2020-04-26 ENCOUNTER — Encounter: Payer: Self-pay | Admitting: *Deleted

## 2020-04-26 ENCOUNTER — Other Ambulatory Visit: Payer: Self-pay | Admitting: Hematology

## 2020-04-26 DIAGNOSIS — C9000 Multiple myeloma not having achieved remission: Secondary | ICD-10-CM

## 2020-04-26 MED ORDER — FENTANYL 12 MCG/HR TD PT72: 1.0000 | MEDICATED_PATCH | TRANSDERMAL | 0 refills | Status: DC

## 2020-04-26 MED ORDER — OXYCODONE HCL 10 MG PO TABS: 10.0000 mg | ORAL_TABLET | Freq: Four times a day (QID) | ORAL | 0 refills | Status: DC | PRN

## 2020-04-30 ENCOUNTER — Telehealth: Payer: Self-pay | Admitting: *Deleted

## 2020-04-30 ENCOUNTER — Encounter: Payer: Self-pay | Admitting: *Deleted

## 2020-04-30 NOTE — Telephone Encounter (Signed)
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 Letter signed by Dr.Kale and mailed to patient's home.

## 2020-04-30 NOTE — Progress Notes (Signed)
Pharmacist Chemotherapy Monitoring - Follow Up Assessment    I verify that I have reviewed each item in the below checklist:  . Regimen for the patient is scheduled for the appropriate day and plan matches scheduled date. Marland Kitchen Appropriate non-routine labs are ordered dependent on drug ordered. . If applicable, additional medications reviewed and ordered per protocol based on lifetime cumulative doses and/or treatment regimen.   Plan for follow-up and/or issues identified: No . I-vent associated with next due treatment: No . MD and/or nursing notified: No  Cheris Tweten D 04/30/2020 9:00 AM

## 2020-05-06 ENCOUNTER — Other Ambulatory Visit: Payer: Self-pay

## 2020-05-06 ENCOUNTER — Inpatient Hospital Stay: Payer: Medicare PPO

## 2020-05-06 VITALS — BP 118/64 | HR 77 | Temp 98.0°F | Resp 16

## 2020-05-06 DIAGNOSIS — C9000 Multiple myeloma not having achieved remission: Secondary | ICD-10-CM | POA: Diagnosis not present

## 2020-05-06 DIAGNOSIS — R932 Abnormal findings on diagnostic imaging of liver and biliary tract: Secondary | ICD-10-CM | POA: Diagnosis not present

## 2020-05-06 DIAGNOSIS — Z7189 Other specified counseling: Secondary | ICD-10-CM

## 2020-05-06 DIAGNOSIS — Z5111 Encounter for antineoplastic chemotherapy: Secondary | ICD-10-CM

## 2020-05-06 DIAGNOSIS — Z79899 Other long term (current) drug therapy: Secondary | ICD-10-CM | POA: Diagnosis not present

## 2020-05-06 DIAGNOSIS — F329 Major depressive disorder, single episode, unspecified: Secondary | ICD-10-CM | POA: Diagnosis not present

## 2020-05-06 DIAGNOSIS — C7951 Secondary malignant neoplasm of bone: Secondary | ICD-10-CM

## 2020-05-06 DIAGNOSIS — Z95828 Presence of other vascular implants and grafts: Secondary | ICD-10-CM

## 2020-05-06 DIAGNOSIS — Z5112 Encounter for antineoplastic immunotherapy: Secondary | ICD-10-CM | POA: Diagnosis not present

## 2020-05-06 DIAGNOSIS — R911 Solitary pulmonary nodule: Secondary | ICD-10-CM | POA: Diagnosis not present

## 2020-05-06 LAB — CMP (CANCER CENTER ONLY)
ALT: 16 U/L (ref 0–44)
AST: 12 U/L — ABNORMAL LOW (ref 15–41)
Albumin: 3.4 g/dL — ABNORMAL LOW (ref 3.5–5.0)
Alkaline Phosphatase: 82 U/L (ref 38–126)
Anion gap: 9 (ref 5–15)
BUN: 13 mg/dL (ref 8–23)
CO2: 22 mmol/L (ref 22–32)
Calcium: 9.6 mg/dL (ref 8.9–10.3)
Chloride: 108 mmol/L (ref 98–111)
Creatinine: 0.96 mg/dL (ref 0.44–1.00)
GFR, Est AFR Am: >60 mL/min (ref >60)
GFR, Estimated: 57 mL/min — ABNORMAL LOW (ref >60)
Glucose, Bld: 166 mg/dL — ABNORMAL HIGH (ref 70–99)
Potassium: 3.7 mmol/L (ref 3.5–5.1)
Sodium: 139 mmol/L (ref 135–145)
Total Bilirubin: 0.6 mg/dL (ref 0.3–1.2)
Total Protein: 6.3 g/dL — ABNORMAL LOW (ref 6.5–8.1)

## 2020-05-06 LAB — CBC WITH DIFFERENTIAL/PLATELET
Abs Immature Granulocytes: 0.03 10*3/uL (ref 0.00–0.07)
Basophils Absolute: 0.1 10*3/uL (ref 0.0–0.1)
Basophils Relative: 1 %
Eosinophils Absolute: 0.3 10*3/uL (ref 0.0–0.5)
Eosinophils Relative: 6 %
HCT: 39.6 % (ref 36.0–46.0)
Hemoglobin: 13.2 g/dL (ref 12.0–15.0)
Immature Granulocytes: 1 %
Lymphocytes Relative: 21 %
Lymphs Abs: 1.2 10*3/uL (ref 0.7–4.0)
MCH: 33.2 pg (ref 26.0–34.0)
MCHC: 33.3 g/dL (ref 30.0–36.0)
MCV: 99.7 fL (ref 80.0–100.0)
Monocytes Absolute: 1.2 10*3/uL — ABNORMAL HIGH (ref 0.1–1.0)
Monocytes Relative: 21 %
Neutro Abs: 2.8 10*3/uL (ref 1.7–7.7)
Neutrophils Relative %: 50 %
Platelets: 165 10*3/uL (ref 150–400)
RBC: 3.97 MIL/uL (ref 3.87–5.11)
RDW: 13.5 % (ref 11.5–15.5)
WBC: 5.6 10*3/uL (ref 4.0–10.5)
nRBC: 0 % (ref 0.0–0.2)

## 2020-05-06 MED ORDER — SODIUM CHLORIDE 0.9 % IV SOLN
Freq: Once | INTRAVENOUS | Status: AC
  Filled 2020-05-06: qty 250

## 2020-05-06 MED ORDER — FAMOTIDINE 20 MG PO TABS
ORAL_TABLET | ORAL | Status: AC
  Filled 2020-05-06: qty 1

## 2020-05-06 MED ORDER — PROCHLORPERAZINE MALEATE 10 MG PO TABS
ORAL_TABLET | ORAL | Status: AC
  Filled 2020-05-06: qty 1

## 2020-05-06 MED ORDER — ACETAMINOPHEN 325 MG PO TABS
ORAL_TABLET | ORAL | Status: AC
  Filled 2020-05-06: qty 2

## 2020-05-06 MED ORDER — SODIUM CHLORIDE 0.9% FLUSH
10.0000 mL | Freq: Once | INTRAVENOUS | Status: AC
  Administered 2020-05-06: 10 mL
  Filled 2020-05-06: qty 10

## 2020-05-06 MED ORDER — PROCHLORPERAZINE MALEATE 10 MG PO TABS
10.0000 mg | ORAL_TABLET | Freq: Once | ORAL | Status: AC
  Administered 2020-05-06: 10 mg via ORAL

## 2020-05-06 MED ORDER — DIPHENHYDRAMINE HCL 25 MG PO CAPS
25.0000 mg | ORAL_CAPSULE | Freq: Once | ORAL | Status: AC
  Administered 2020-05-06: 25 mg via ORAL

## 2020-05-06 MED ORDER — HEPARIN SOD (PORK) LOCK FLUSH 100 UNIT/ML IV SOLN
500.0000 [IU] | Freq: Once | INTRAVENOUS | Status: AC | PRN
  Administered 2020-05-06: 500 [IU]
  Filled 2020-05-06: qty 5

## 2020-05-06 MED ORDER — FAMOTIDINE 20 MG PO TABS
20.0000 mg | ORAL_TABLET | Freq: Once | ORAL | Status: AC
  Administered 2020-05-06: 20 mg via ORAL

## 2020-05-06 MED ORDER — DEXAMETHASONE 4 MG PO TABS
20.0000 mg | ORAL_TABLET | Freq: Once | ORAL | Status: AC
  Administered 2020-05-06: 20 mg via ORAL

## 2020-05-06 MED ORDER — ACETAMINOPHEN 325 MG PO TABS
650.0000 mg | ORAL_TABLET | Freq: Once | ORAL | Status: AC
  Administered 2020-05-06: 650 mg via ORAL

## 2020-05-06 MED ORDER — DIPHENHYDRAMINE HCL 25 MG PO CAPS
ORAL_CAPSULE | ORAL | Status: AC
  Filled 2020-05-06: qty 1

## 2020-05-06 MED ORDER — DEXTROSE 5 % IV SOLN
55.0000 mg/m2 | Freq: Once | INTRAVENOUS | Status: AC
  Administered 2020-05-06: 100 mg via INTRAVENOUS
  Filled 2020-05-06: qty 30

## 2020-05-06 MED ORDER — SODIUM CHLORIDE 0.9% FLUSH
10.0000 mL | INTRAVENOUS | Status: DC | PRN
  Administered 2020-05-06: 10 mL
  Filled 2020-05-06: qty 10

## 2020-05-06 MED ORDER — DEXAMETHASONE 4 MG PO TABS
ORAL_TABLET | ORAL | Status: AC
  Filled 2020-05-06: qty 5

## 2020-05-06 NOTE — Patient Instructions (Signed)
[facility] Discharge Instructions for Patients Receiving Chemotherapy  Today you received the following chemotherapy agent: Carfilzomib (Kyprolis)  To help prevent nausea and vomiting after your treatment, we encourage you to take your nausea medication as directed by your MD.   If you develop nausea and vomiting that is not controlled by your nausea medication, call the clinic.   BELOW ARE SYMPTOMS THAT SHOULD BE REPORTED IMMEDIATELY:  *FEVER GREATER THAN 100.5 F  *CHILLS WITH OR WITHOUT FEVER  NAUSEA AND VOMITING THAT IS NOT CONTROLLED WITH YOUR NAUSEA MEDICATION  *UNUSUAL SHORTNESS OF BREATH  *UNUSUAL BRUISING OR BLEEDING  TENDERNESS IN MOUTH AND THROAT WITH OR WITHOUT PRESENCE OF ULCERS  *URINARY PROBLEMS  *BOWEL PROBLEMS  UNUSUAL RASH Items with * indicate a potential emergency and should be followed up as soon as possible.  Feel free to call the clinic should you have any questions or concerns. The clinic phone number is [phone].  Please show the CHEMO ALERT CARD at check-in to the Emergency Department and triage nurse.   

## 2020-05-06 NOTE — Patient Instructions (Signed)

## 2020-05-07 ENCOUNTER — Other Ambulatory Visit: Payer: Self-pay | Admitting: Hematology

## 2020-05-07 ENCOUNTER — Telehealth: Payer: Self-pay | Admitting: *Deleted

## 2020-05-07 DIAGNOSIS — C9000 Multiple myeloma not having achieved remission: Secondary | ICD-10-CM

## 2020-05-07 MED ORDER — LENALIDOMIDE 15 MG PO CAPS: ORAL_CAPSULE | ORAL | 0 refills | Status: DC

## 2020-05-07 NOTE — Telephone Encounter (Signed)
Contacted [facility]) [place], [place]  to provide the Fanny Dance [id number], 05/07/20 for Revlimid refill sent earlier

## 2020-05-15 ENCOUNTER — Other Ambulatory Visit: Payer: Self-pay | Admitting: Hematology

## 2020-05-20 ENCOUNTER — Other Ambulatory Visit: Payer: Medicare PPO

## 2020-05-20 ENCOUNTER — Ambulatory Visit: Payer: Medicare PPO

## 2020-05-21 ENCOUNTER — Inpatient Hospital Stay: Payer: Medicare PPO | Admitting: Hematology

## 2020-05-21 ENCOUNTER — Inpatient Hospital Stay: Payer: Medicare PPO

## 2020-05-21 ENCOUNTER — Inpatient Hospital Stay: Payer: Medicare PPO | Attending: Hematology

## 2020-05-21 ENCOUNTER — Other Ambulatory Visit: Payer: Self-pay

## 2020-05-21 VITALS — BP 119/57 | HR 76 | Temp 97.7°F | Resp 16 | Wt 161.0 lb

## 2020-05-21 DIAGNOSIS — Z95828 Presence of other vascular implants and grafts: Secondary | ICD-10-CM

## 2020-05-21 DIAGNOSIS — Z7189 Other specified counseling: Secondary | ICD-10-CM | POA: Diagnosis not present

## 2020-05-21 DIAGNOSIS — C7951 Secondary malignant neoplasm of bone: Secondary | ICD-10-CM

## 2020-05-21 DIAGNOSIS — Z5111 Encounter for antineoplastic chemotherapy: Secondary | ICD-10-CM

## 2020-05-21 DIAGNOSIS — C9000 Multiple myeloma not having achieved remission: Secondary | ICD-10-CM

## 2020-05-21 DIAGNOSIS — Z5112 Encounter for antineoplastic immunotherapy: Secondary | ICD-10-CM | POA: Insufficient documentation

## 2020-05-21 LAB — CMP (CANCER CENTER ONLY)
ALT: 14 U/L (ref 0–44)
AST: 12 U/L — ABNORMAL LOW (ref 15–41)
Albumin: 3.4 g/dL — ABNORMAL LOW (ref 3.5–5.0)
Alkaline Phosphatase: 75 U/L (ref 38–126)
Anion gap: 14 (ref 5–15)
BUN: 12 mg/dL (ref 8–23)
CO2: 19 mmol/L — ABNORMAL LOW (ref 22–32)
Calcium: 9.7 mg/dL (ref 8.9–10.3)
Chloride: 108 mmol/L (ref 98–111)
Creatinine: 0.83 mg/dL (ref 0.44–1.00)
GFR, Est AFR Am: >60 mL/min (ref >60)
GFR, Estimated: >60 mL/min (ref >60)
Glucose, Bld: 140 mg/dL — ABNORMAL HIGH (ref 70–99)
Potassium: 3.7 mmol/L (ref 3.5–5.1)
Sodium: 141 mmol/L (ref 135–145)
Total Bilirubin: 0.6 mg/dL (ref 0.3–1.2)
Total Protein: 6.3 g/dL — ABNORMAL LOW (ref 6.5–8.1)

## 2020-05-21 LAB — CBC WITH DIFFERENTIAL/PLATELET
Abs Immature Granulocytes: 0.02 10*3/uL (ref 0.00–0.07)
Basophils Absolute: 0.1 10*3/uL (ref 0.0–0.1)
Basophils Relative: 2 %
Eosinophils Absolute: 0.2 10*3/uL (ref 0.0–0.5)
Eosinophils Relative: 4 %
HCT: 37.7 % (ref 36.0–46.0)
Hemoglobin: 12.7 g/dL (ref 12.0–15.0)
Immature Granulocytes: 0 %
Lymphocytes Relative: 23 %
Lymphs Abs: 1.2 10*3/uL (ref 0.7–4.0)
MCH: 33.6 pg (ref 26.0–34.0)
MCHC: 33.7 g/dL (ref 30.0–36.0)
MCV: 99.7 fL (ref 80.0–100.0)
Monocytes Absolute: 1.3 10*3/uL — ABNORMAL HIGH (ref 0.1–1.0)
Monocytes Relative: 25 %
Neutro Abs: 2.4 10*3/uL (ref 1.7–7.7)
Neutrophils Relative %: 46 %
Platelets: 205 10*3/uL (ref 150–400)
RBC: 3.78 MIL/uL — ABNORMAL LOW (ref 3.87–5.11)
RDW: 13.8 % (ref 11.5–15.5)
WBC: 5.2 10*3/uL (ref 4.0–10.5)
nRBC: 0 % (ref 0.0–0.2)

## 2020-05-21 MED ORDER — ZOLEDRONIC ACID 4 MG/100ML IV SOLN
INTRAVENOUS | Status: AC
  Filled 2020-05-21: qty 100

## 2020-05-21 MED ORDER — FAMOTIDINE 20 MG PO TABS
ORAL_TABLET | ORAL | Status: AC
  Filled 2020-05-21: qty 1

## 2020-05-21 MED ORDER — DIPHENHYDRAMINE HCL 25 MG PO TABS
25.0000 mg | ORAL_TABLET | Freq: Once | ORAL | Status: AC
  Administered 2020-05-21: 25 mg via ORAL
  Filled 2020-05-21: qty 1

## 2020-05-21 MED ORDER — SODIUM CHLORIDE 0.9% FLUSH
10.0000 mL | INTRAVENOUS | Status: DC | PRN
  Administered 2020-05-21: 10 mL
  Filled 2020-05-21: qty 10

## 2020-05-21 MED ORDER — SODIUM CHLORIDE 0.9% FLUSH
10.0000 mL | Freq: Once | INTRAVENOUS | Status: AC
  Administered 2020-05-21: 10 mL
  Filled 2020-05-21: qty 10

## 2020-05-21 MED ORDER — ACYCLOVIR 400 MG PO TABS: ORAL_TABLET | ORAL | 5 refills | Status: DC

## 2020-05-21 MED ORDER — ZOLEDRONIC ACID 4 MG/100ML IV SOLN
4.0000 mg | Freq: Once | INTRAVENOUS | Status: AC
  Administered 2020-05-21: 4 mg via INTRAVENOUS

## 2020-05-21 MED ORDER — DEXTROSE 5 % IV SOLN
56.0000 mg/m2 | Freq: Once | INTRAVENOUS | Status: AC
  Administered 2020-05-21: 100 mg via INTRAVENOUS
  Filled 2020-05-21: qty 30

## 2020-05-21 MED ORDER — DEXAMETHASONE 4 MG PO TABS
20.0000 mg | ORAL_TABLET | Freq: Once | ORAL | Status: AC
  Administered 2020-05-21: 20 mg via ORAL

## 2020-05-21 MED ORDER — SODIUM CHLORIDE 0.9 % IV SOLN
Freq: Once | INTRAVENOUS | Status: AC
  Filled 2020-05-21: qty 250

## 2020-05-21 MED ORDER — ACETAMINOPHEN 325 MG PO TABS
650.0000 mg | ORAL_TABLET | Freq: Once | ORAL | Status: AC
  Administered 2020-05-21: 650 mg via ORAL

## 2020-05-21 MED ORDER — PROCHLORPERAZINE MALEATE 10 MG PO TABS
10.0000 mg | ORAL_TABLET | Freq: Once | ORAL | Status: AC
  Administered 2020-05-21: 10 mg via ORAL

## 2020-05-21 MED ORDER — DEXAMETHASONE 4 MG PO TABS
ORAL_TABLET | ORAL | Status: AC
  Filled 2020-05-21: qty 5

## 2020-05-21 MED ORDER — SODIUM CHLORIDE 0.9 % IV SOLN
Freq: Once | INTRAVENOUS | Status: DC
  Filled 2020-05-21: qty 250

## 2020-05-21 MED ORDER — DIPHENHYDRAMINE HCL 25 MG PO CAPS
ORAL_CAPSULE | ORAL | Status: AC
  Filled 2020-05-21: qty 1

## 2020-05-21 MED ORDER — FAMOTIDINE IN NACL 20-0.9 MG/50ML-% IV SOLN
INTRAVENOUS | Status: AC
  Filled 2020-05-21: qty 50

## 2020-05-21 MED ORDER — ACETAMINOPHEN 325 MG PO TABS
ORAL_TABLET | ORAL | Status: AC
  Filled 2020-05-21: qty 2

## 2020-05-21 MED ORDER — PROCHLORPERAZINE MALEATE 10 MG PO TABS
ORAL_TABLET | ORAL | Status: AC
  Filled 2020-05-21: qty 1

## 2020-05-21 MED ORDER — HEPARIN SOD (PORK) LOCK FLUSH 100 UNIT/ML IV SOLN
500.0000 [IU] | Freq: Once | INTRAVENOUS | Status: AC | PRN
  Administered 2020-05-21: 500 [IU]
  Filled 2020-05-21: qty 5

## 2020-05-21 MED ORDER — FAMOTIDINE 20 MG PO TABS
20.0000 mg | ORAL_TABLET | Freq: Once | ORAL | Status: AC
  Administered 2020-05-21: 20 mg via ORAL

## 2020-05-21 NOTE — Progress Notes (Signed)
  HEMATOLOGY/ONCOLOGY CLINIC NOTE  Date of Service: 05/21/2020  Patient Care Team: Crawford, Elizabeth A, MD as PCP - General (Internal Medicine) Brodie, Dora M, MD (Inactive) as Consulting Physician (Gastroenterology) Miller, Mary S, MD as Consulting Physician (Gynecology) Dalldorf, Peter, MD as Consulting Physician (Orthopedic Surgery) Young, Clinton D, MD as Consulting Physician (Pulmonary Disease) Hecker, Kathryn, MD (Ophthalmology)  CHIEF COMPLAINTS/PURPOSE OF CONSULTATION:  Continue mx of myeloma  HISTORY OF PRESENTING ILLNESS:   Faith Orr is a wonderful 78 y.o. female who has been referred to us by Dr. Elizabeth Crawford for evaluation and management of Lytic bone lesions. She is accompanied today by her son in law, and her partner is present via phone. The pt reports that she is doing well overall.   The pt notes that 2-3 months ago while standing at the stove, and otherwise feeling normally, she felt "something snap that took her breath away" in her mid back as she stretched to get something. The pt notes that she saw a chiropractor twice due to her back stiffness, which did not help. The pt then described her new bone pains to her PCP on 12/05/18, and subsequent imaging, as noted below, revealed concerns for numerous bone lesions. She has begun 70mg Fosamax. The pt notes that she had hip pain in 2018, and that an XR at that time did not reveal any lesions.  The pt notes that most of her pain is concentrated to her left shoulder presently, and with minimal movement of the arm. She endorses pain radiating into her left arm and notes that her hand is swollen in the mornings when she wakes up. She also endorses present back pain and right hip pain, worse when she walks. She has not yet seen orthopedics. The pt reports that she is needing to take 600mg Advil every 4-6 hours as Tramadol alone has not been able to alleviate her pain. The pt denies any unexpected weight loss, fevers,  chills, or night sweats. The pt notes that her urine is a very dark color presently, but denies overt blood in the urine, underpants, nor tissue paper. The pt notes that in the last two weeks she has had some soreness in her head, but denies new headaches or changes in vision. The pt notes that she has been urinating more frequently overall, but has been trying to stay better hydrated as well.  The pt notes that she has been compliant with annual mammograms.   The pt notes that she had a cyst in her right breast which was removed in the past. She fractured her left wrist in 2008 after falling down stairs. The pt endorses history of fatty liver.   The pt denies ever smoking cigarettes and endorses significant second hand smoke exposure with a previous marriage.   Of note prior to the patient's visit today, pt has had a Bone Scan completed on 12/13/18 with results revealing Multifocal uptake throughout the skeleton, consistent with diffuse metastatic disease. Primary tumor is not specified. 2. Uptake in the proximal right femur, consistent with lytic lesions. 3. Uptake in the ribs bilaterally as described. 4. Lesions in the proximal left humerus. 5. Diffuse uptake throughout the skull consistent with metastatic disease. 6. Right paramedian uptake at the manubrium.  Most recent lab results (12/08/18) of CBC w/diff and CMP is as follows: all values are WNL except for Glucose at 279, BUN at 24, AST at 41, ALT at 46. 12/08/18 SPEP revealed all values WNL except for Total   Protein at 6.0, Albumin at 3.6, Gamma globulin at 0.7, and M spike at 0.5g  On review of systems, pt reports significant left shoulder pain, back pain, right hip pain, dark urine, and denies fevers, chills, night sweats, unexpected weight loss, changes in bowel habits, changes in breathing, cough, new respiratory symptoms, changes in vision, abdominal pains, leg swelling, and any other symptoms.   On PMHx the pt reports fatty liver, and  denies blood clots.  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 Denies ever smoking.  On Family Hx the pt reports maternal grandmother with colon cancer. Father with bladder cancer and amyloidosis (pt notes that this could have been misdiagnosis). Mother with Protein S deficiency and polymyalgia rheumatica.  Current Treatment: Carfilzomib + Revlimid maintenance.  Interval History:   Faith Orr returns today for management and evaluation of her Multiple Myeloma and C17D1 of maintenance Carfilzomib, Revlimid, and Dexamethasone. The patient's last visit with us was on 04/23/2020. The pt reports that she is doing well overall.  The pt reports that her fatigue has been much more manageable since her treatment was adjusted. She is able to the the things that she would like to do. Pt is interested in restarting water aerobics at the [employer] as it helps with her chronic back pain.   Lab results today (05/21/20) of CBC w/diff and CMP is as follows: all values are WNL except for RBC at 3.78, Mono Abs at 1.3K, CO2 at 19, Glucose at 140, Total Protein at 6.3, Albumin at 3.4, AST at 12.  On review of systems, pt denies fevers, chills, night sweats, new bone pain, mouth sores, rashes, diarrhea and any other symptoms.   MEDICAL HISTORY:  Past Medical History:  Diagnosis Date  . Allergy    seasonal  . Asthma   . DEPRESSION   . DIABETES MELLITUS, TYPE II   . Diverticulosis   . HYPERLIPIDEMIA   . Macular degeneration of left eye    mild, Dr.Hecker  . Obesity, unspecified   . Osteoarthritis of both knees   . OSTEOPENIA   . Osteopenia   . URINARY INCONTINENCE     SURGICAL HISTORY: Past Surgical History:  Procedure Laterality Date  . CATARACT EXTRACTION Left 05/24/2018  . CESAREAN SECTION  01/1973  . CYSTOSCOPY/URETEROSCOPY/HOLMIUM LASER/STENT PLACEMENT Right 09/20/2019   Procedure: CYSTOSCOPY/URETEROSCOPY/HOLMIUM LASER/STENT PLACEMENT;  Surgeon:  Bell, Eugene D III, MD;  Location: [facility];  Service: Urology;  Laterality: Right;  . FRACTURE SURGERY    . IR IMAGING GUIDED PORT INSERTION  02/20/2019  . left wrist surgery  2008   By Dr. Daldorf  . right ankle  1994    SOCIAL HISTORY: Social History   Socioeconomic History  . Marital status: Married    Spouse name: Not on file  . Number of children: 1  . Years of education: Not on file  . Highest education level: Not on file  Occupational History    Employer: [employer]  Tobacco Use  . Smoking status: Never Smoker  . Smokeless tobacco: Never Used  . Tobacco comment: Lives with partner (Faith Orr) and son  Substance and Sexual Activity  . Alcohol use: No    Alcohol/week: 0.0 standard drinks  . Drug use: No  . Sexual activity: Never    Partners: Female    Birth control/protection: Post-menopausal    Comment: Lives with female partner (Faith Orr) and 13 yo son  Other   Topics Concern  . Not on file  Social History Narrative  . Not on file   Social Determinants of Health   Financial Resource Strain:   . Difficulty of Paying Living Expenses:   Food Insecurity:   . Worried About Running Out of Food in the Last Year:   . Ran Out of Food in the Last Year:   Transportation Needs:   . Lack of Transportation (Medical):   . Lack of Transportation (Non-Medical):   Physical Activity:   . Days of Exercise per Week:   . Minutes of Exercise per Session:   Stress:   . Feeling of Stress :   Social Connections:   . Frequency of Communication with Friends and Family:   . Frequency of Social Gatherings with Friends and Family:   . Attends Religious Services:   . Active Member of Clubs or Organizations:   . Attends Club or Organization Meetings:   . Marital Status:   Intimate Partner Violence:   . Fear of Current or Ex-Partner:   . Emotionally Abused:   . Physically Abused:   . Sexually Abused:     FAMILY HISTORY: Family History    Problem Relation Age of Onset  . Diabetes Father   . Hyperlipidemia Father   . Heart disease Father   . Cancer Father   . Hypertension Father   . Colon cancer Paternal Grandmother 90  . Osteoporosis Mother   . Protein S deficiency Mother   . Hyperlipidemia Mother   . Multiple sclerosis Daughter   . Cancer Other        bladder  . Breast cancer Neg Hx     ALLERGIES:  is allergic to penicillins; aleve [naproxen sodium]; and sulfonamide derivatives.  MEDICATIONS:  Current Outpatient Medications  Medication Sig Dispense Refill  . acyclovir (ZOVIRAX) 400 MG tablet TAKE 1 TABLET(400 MG) BY MOUTH TWICE Orr 60 tablet 1  . aspirin EC 81 MG tablet Take 81 mg by mouth Orr after breakfast.     . Blood Glucose Monitoring Suppl (FREESTYLE FREEDOM LITE) W/DEVICE KIT Use to check blood sugars twice a day Dx 250.00 1 each 0  . Calcium Carbonate-Vitamin D (CALCIUM 600+D HIGH POTENCY) 600-400 MG-UNIT per tablet Take 1 tablet by mouth 2 (two) times Orr.     . Cetirizine HCl 10 MG CAPS Take 1 capsule (10 mg total) by mouth Orr. 30 capsule 1  . dexamethasone (DECADRON) 4 MG tablet Take 5 tablets (20 mg total) by mouth once a week. On D22 of each cycle of treatment 20 tablet 5  . doxycycline (VIBRA-TABS) 100 MG tablet Take 1 tablet (100 mg total) by mouth 2 (two) times Orr. 14 tablet 1  . fentaNYL (DURAGESIC) 12 MCG/HR Place 1 patch onto the skin every 3 (three) days. 10 patch 0  . fluticasone (FLONASE) 50 MCG/ACT nasal spray Place 1 spray into both nostrils Orr. (Patient taking differently: Place 1 spray into both nostrils Orr as needed for allergies or rhinitis. ) 16 g 2  . glipiZIDE (GLUCOTROL XL) 5 MG 24 hr tablet TAKE 1 TABLET(5 MG) BY MOUTH Orr WITH BREAKFAST 90 tablet 1  . glucose blood (FREESTYLE LITE) test strip CHECK BLOOD SUGAR TWICE Orr AS DIRECTED Dx 250.00 180 each 3  . Lancets (FREESTYLE) lancets Use twice Orr to check sugars. 100 each 11  . lenalidomide (REVLIMID) 15  MG capsule TAKE 1 CAPSULE BY MOUTH  Orr FOR 21 DAYS ON, THEN  7 DAYS OFF 21 capsule 0  .   lidocaine-prilocaine (EMLA) cream APPLY 1 APPLICATION TO THE AFFECTED AREA AS NEEDED. USE PRIOR TO PORT ACCESS 30 g 0  . LORazepam (ATIVAN) 0.5 MG tablet Take 1 tablet (0.5 mg total) by mouth every 8 (eight) hours as needed for anxiety (significant essential tremors). 60 tablet 0  . metFORMIN (GLUCOPHAGE-XR) 500 MG 24 hr tablet TAKE 3 TABLETS(1500 MG) BY MOUTH Orr WITH BREAKFAST 270 tablet 1  . methylPREDNISolone (MEDROL DOSEPAK) 4 MG TBPK tablet As directed 21 tablet 0  . Multiple Vitamins-Minerals (ICAPS) CAPS Take 1 capsule by mouth Orr after breakfast.     . ondansetron (ZOFRAN) 8 MG tablet Take 1 tablet (8 mg total) by mouth 2 (two) times Orr as needed (Nausea or vomiting). 30 tablet 1  . Oxycodone HCl 10 MG TABS Take 1 tablet (10 mg total) by mouth every 6 (six) hours as needed. 90 tablet 0  . pantoprazole (PROTONIX) 20 MG tablet TAKE 1 TABLET(20 MG) BY MOUTH Orr 30 tablet 5  . polyethylene glycol (MIRALAX / GLYCOLAX) packet Take 17 g by mouth Orr after breakfast.     . potassium chloride SA (KLOR-CON) 20 MEQ tablet TAKE 1 TABLET(20 MEQ) BY MOUTH TWICE Orr 60 tablet 1  . prochlorperazine (COMPAZINE) 10 MG tablet Take 1 tablet (10 mg total) by mouth every 6 (six) hours as needed (Nausea or vomiting). 30 tablet 1  . senna-docusate (SENNA S) 8.6-50 MG tablet Take 2 tablets by mouth at bedtime. 60 tablet 2  . sertraline (ZOLOFT) 50 MG tablet Take 1 tablet (50 mg total) by mouth Orr. Annual appt due in June w/labs must see provider for future refills 30 tablet 0  . simvastatin (ZOCOR) 20 MG tablet Take 1 tablet (20 mg total) by mouth Orr at 6 PM. Annual appt due in June w/labs must see provider for future refills 30 tablet 0  . Triamcinolone Acetonide 0.025 % LOTN Apply 1 application topically 3 (three) times Orr as needed (rash/itching). 60 mL 2  . triamcinolone cream (KENALOG) 0.5 % Apply 1  application topically 4 (four) times Orr. 90 g 1  . Vitamin D, Ergocalciferol, (DRISDOL) 1.25 MG (50000 UNIT) CAPS capsule TAKE 1 CAPSULE BY MOUTH EVERY 7 DAYS 12 capsule 0   No current facility-administered medications for this visit.   Facility-Administered Medications Ordered in Other Visits  Medication Dose Route Frequency Provider Last Rate Last Admin  . heparin lock flush 100 unit/mL  500 Units Intracatheter Once PRN Kale, Gautam Kishore, MD      . sodium chloride flush (NS) 0.9 % injection 10 mL  10 mL Intracatheter PRN Kale, Gautam Kishore, MD   10 mL at 10/02/19 1524    REVIEW OF SYSTEMS:   A 10+ POINT REVIEW OF SYSTEMS WAS OBTAINED including neurology, dermatology, psychiatry, cardiac, respiratory, lymph, extremities, GI, GU, Musculoskeletal, constitutional, breasts, reproductive, HEENT.  All pertinent positives are noted in the HPI.  All others are negative.   PHYSICAL EXAMINATION: ECOG FS:2 - Symptomatic, <50% confined to bed  Vitals:   05/21/20 0913  BP: (!) 119/57  Pulse: 76  Resp: 16  Temp: 97.7 F (36.5 C)  SpO2: 98%   Wt Readings from Last 3 Encounters:  05/21/20 161 lb (73 kg)  04/23/20 162 lb 11.2 oz (73.8 kg)  04/08/20 161 lb 8 oz (73.3 kg)   Body mass index is 32.52 kg/m.    Exam was given in a chair   GENERAL:alert, in no acute distress and comfortable SKIN: no acute rashes, no significant   lesions EYES: conjunctiva are pink and non-injected, sclera anicteric OROPHARYNX: MMM, no exudates, no oropharyngeal erythema or ulceration NECK: supple, no JVD LYMPH:  no palpable lymphadenopathy in the cervical, axillary or inguinal regions LUNGS: clear to auscultation b/l with normal respiratory effort HEART: regular rate & rhythm ABDOMEN:  normoactive bowel sounds , non tender, not distended. No palpable hepatosplenomegaly.  Extremity: no pedal edema PSYCH: alert & oriented x 3 with fluent speech NEURO: no focal motor/sensory deficits  LABORATORY DATA:    I have reviewed the data as listed  . CBC Latest Ref Rng & Units 05/21/2020 05/06/2020 04/23/2020  WBC 4.0 - 10.5 K/uL 5.2 5.6 4.2  Hemoglobin 12.0 - 15.0 g/dL 12.7 13.2 12.9  Hematocrit 36.0 - 46.0 % 37.7 39.6 38.2  Platelets 150 - 400 K/uL 205 165 175   . CBC    Component Value Date/Time   WBC 5.2 05/21/2020 0850   RBC 3.78 (L) 05/21/2020 0850   HGB 12.7 05/21/2020 0850   HGB 12.8 02/16/2019 1138   HCT 37.7 05/21/2020 0850   PLT 205 05/21/2020 0850   PLT 170 02/16/2019 1138   MCV 99.7 05/21/2020 0850   MCH 33.6 05/21/2020 0850   MCHC 33.7 05/21/2020 0850   RDW 13.8 05/21/2020 0850   LYMPHSABS 1.2 05/21/2020 0850   MONOABS 1.3 (H) 05/21/2020 0850   EOSABS 0.2 05/21/2020 0850   BASOSABS 0.1 05/21/2020 0850    . CMP Latest Ref Rng & Units 05/21/2020 05/06/2020 04/23/2020  Glucose 70 - 99 mg/dL 140(H) 166(H) 145(H)  BUN 8 - 23 mg/dL 12 13 13  Creatinine 0.44 - 1.00 mg/dL 0.83 0.96 0.78  Sodium 135 - 145 mmol/L 141 139 140  Potassium 3.5 - 5.1 mmol/L 3.7 3.7 4.0  Chloride 98 - 111 mmol/L 108 108 108  CO2 22 - 32 mmol/L 19(L) 22 23  Calcium 8.9 - 10.3 mg/dL 9.7 9.6 9.6  Total Protein 6.5 - 8.1 g/dL 6.3(L) 6.3(L) 6.4(L)  Total Bilirubin 0.3 - 1.2 mg/dL 0.6 0.6 0.6  Alkaline Phos 38 - 126 U/L 75 82 76  AST 15 - 41 U/L 12(L) 12(L) 12(L)  ALT 0 - 44 U/L 14 16 16   09/18/2019 BM Bx Report ([id number])   09/18/2019 FISH Panel    05/30/2019 BM Bx   01/06/2019 BM Bx:     01/06/19 Cytogenetics:      05/30/19 BM Biopsy:   09/18/2019 FISH Panel    09/18/2019 BM Surgical Pathology ([id number])     RADIOGRAPHIC STUDIES: I have personally reviewed the radiological images as listed and agreed with the findings in the report. No results found.  ASSESSMENT & PLAN:   78 y.o. female with  1. Recently diagnosed Multiple Myeloma, RISS Stage III  Labs upon initial presentation from 12/08/18, blood counts are normal including WBC at 7.1k, HGB at 13.1, and PLT  at 245k. Calcium normal at 10.3. Creatinine normal at 0.63. M spike at 0.5g. 12/13/18 Bone Scan revealed Multifocal uptake throughout the skeleton, consistent with diffuse metastatic disease. Primary tumor is not specified. 2. Uptake in the proximal right femur, consistent with lytic lesions. 3. Uptake in the ribs bilaterally as described. 4. Lesions in the proximal left humerus. 5. Diffuse uptake throughout the skull consistent with metastatic disease. 6. Right paramedian uptake at the manubrium.  12/13/18 CT Right Femur revealed Numerous lytic lesions involving the right femur and a lytic lesion in the left inferior pubic ramus. Overall appearance is most concerning for multiple myeloma  12/27/18   Pretreatment 24hour UPEP observed an M spike at 83m, and showed 1955mtotal protein/day.  12/27/18 Pretreatment MMP revealed M Protein at 0.5g with IgG Lambda specificity. Kappa:Lambda light chain ratio at 0.13, with Lambda at 40.3. There is less abnormal protein and light chains than I would expect from 30% plasma cells, which suggests hypo-secretory or non-secretory neoplastic plasma cells. Will have an impact in assessing response. 01/05/19 PET/CT revealed Innumerable lytic lesions in the skeleton compatible with myeloma. Most of the larger lesions are hypermetabolic, for example including a left proximal humeral shaft lesion with maximum SUV of 8.1 and a 2.8 cm lesion in the left T9 vertebral body with maximum SUV 5.1. Most of the smaller lytic lesions, and some of the larger lesions, do not demonstrate accentuated metabolic activity. 2. 1.2 cm in short axis lymph node in the left parapharyngeal space is hypermetabolic with maximum SUV 11.8. I do not see a separate mass in the head and neck to give rise to this hypermetabolic lymph node. 3. Mosaic attenuation in the lower lobes, nonspecific possibly from air trapping. 4.  Aortic Atherosclerosis 5. Heterogeneous activity in the liver, making it hard to exclude  small liver lesions. Consider hepatic protocol MRI with and without contrast for definitive assessment. Nonobstructive right nephrolithiasis. Old granulomatous disease  01/06/19 Bone Marrow biopsy revealed interstitial increase in plasma cells (28% aspirate, 40% CD138 immunohistochemistry). Plasma cells negative for light chains consistent with a non or weakly secretory myeloma   01/06/19 Cytogenetics revealed 37% of cells with trisomy 11 or 11q deletion, and 40.5% of cells with 17p mutation  S/p 5 cycles of KRD treatment  05/31/19 BM Biopsy revealed mild atypical plasmacytosis at 5% with polytypic variation.   06/01/19 PET/CT revealed "Dominant lesion in the LEFT humerus is decreased significantly in metabolic activity. Additional hypermetabolic skeletal lytic lesions have decreased in metabolic activity or similar to comparison exam (01/05/2019). No evidence of disease progression. 2. Multiple additional lytic lesions do not have metabolic activity and unchanged. 3. No new skeletal lesions are identified. No soft tissue plasmacytoma identified. 4. Nodule / node in the LEFT parapharyngeal space which is intensely hypermetabolic not changed from prior. 5. New hypermetabolic LEFT lower lobe pulmonary nodule is indeterminate. Recommend close attention on follow-up 6. New obstructive hydronephrosis of the RIGHT kidney related to RIGHT UPJ stone."  09/18/2019 BM Bx Report which revealed "Slightly hypercellular bone marrow for age with trilineage hematopoiesis and 1% plasma cells."  09/14/2019 PET/CT Whole Body Scan ([id number] revealed "1. There widespread tiny lytic lesions compatible with multiple myeloma. Index larger lesions are generally similar to the prior exam, with low-grade activity such as the left T9 vertebral body lesion with maximum SUV 4.5. Is mild increase in the activity associated with a mildly sclerotic left proximal humeral lesion, maximum SUV 4.8 (previously 3.5). 2. At the site of  the prior left lower lobe nodule is currently more bandlike thickening, with maximum SUV only 1.9, probably benign, continued surveillance of this region suggested. 3. There several small but hypermetabolic lymph nodes. This includes a left parapharyngeal space node measuring 1.0 cm with maximum SUV 12.3 (stable); a left level IB lymph node measuring 0.5 cm with maximum SUV 4.8 (slightly larger than prior); and a left inguinal lymph node measuring 0.7 cm in short axis with maximum SUV 6.4 (previously 0.5 cm with maximum SUV 0.6). Significance of these lymph nodes uncertain, surveillance is recommended. 4. New 5 mm left lower lobe subpleural nodule on image 32/8, not appreciably hypermetabolic,  surveillance suggested. 5. Focal subcutaneous stranding along the left perineum measuring about 2.6 by 1.1 cm on image 221/4, maximum SUV 12.5. This was not present previously and is most likely inflammatory, although given the notable SUV, surveillance of this region is suggested. 6. Other imaging findings of potential clinical significance: Aortic Atherosclerosis (ICD10-I70.0). Coronary atherosclerosis. Old granulomatous disease. Mild right hydronephrosis due to a 7 mm right UPJ calculus. 2 mm right kidney upper pole nonobstructive renal calculus. Prominent stool throughout the colon favors constipation."  12/19/2019 Thoracic & Lumbar Spine MRI ([id number]) ([id number]) revealed "Suspected myeloma lesions at T9 and S1. No compression deformity or epidural disease.  01/18/2020 PET/CT ([id number]) which revealed "1. Stable lytic lesions throughout the skeleton. The larger lytic lesions which had mild metabolic activity on comparison exam now have background metabolic activity. No evidence of active myeloma. No evidence of progression multiple myeloma.  No plasmacytoma 3. Hypermetabolic nodules in the LEFT neck may be associated deep tissues of the LEFT parotid gland. Consider primary parotid neoplasm as etiology for these  intensity metabolic small lesions lesions."  2. 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  01/17/19 MRI Liver revealed Several appreciable liver lesions all have benign imaging characteristics. No MRI findings of metastatic involvement of the liver. 2. Scattered bony lesions corresponding to the lytic lesions seen at PET-CT, compatible with active myeloma. 3. Aortic Atherosclerosis.  Mild cardiomegaly. 4. Diffuse hepatic steatosis.   3. Left lower lobe pulmonary nodule First seen on 06/01/19 PET/CT  PLAN: -Discussed pt labwork today, 05/21/20; blood counts and chemistries are steady.  -Discussed 04/23/2020 MMP shows M Protein is "Not Observed" -Discussed 04/23/2020 K/L light chains are all WNL -The pt shows no lab or clinical evidence of Multiple Myeloma progression at this time -Pt has a non-secretory Multiple Myeloma. Will repeat bone imaging in 2-3 months to monitor disease.  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 -Will continue Carflizomib at 56 mg/m^2q 2weeks for maintenance. Fatigue is well-controlled at this dose.  -Continue 15 mg Revlimid 3 weeks on, 1 week off for maintainence  -Advised pt that there may be some reduction in vaccine efficacy due to her being on immune suppressing treatment, as well as her Multiple Myeloma, but that her COVID19 vaccine is somewhat protective. -Advised pt that participating in public exercise will be a personal choice and that she should weigh the risks versus the benefits.  -Refill Acyclovir -Will see back in 6 weeks with labs    FOLLOW UP: Please schedule next 4 doses of Carfilzomib (every 2 weeks as ordered) with portflush and labs MD visit in 6 weeks   The total time spent in the appt was 20 minutes and more than 50% was on counseling and direct patient cares.  All of the patient's questions were answered  with apparent satisfaction. The patient knows to call the clinic with any problems, questions or concerns.    Gautam Kale MD MS AAHIVMS [facility] CTH Hematology/Oncology Physician [facility]  (Office):       [phone] (Work cell):  [phone] (Fax):           [phone]  05/21/2020 9:38 AM  I, Jazzmine Knight, am acting as a scribe for Dr. Gautam Kale.   .I have reviewed the above documentation for accuracy and completeness, and I agree with the above. .Gautam Kishore Kale MD    

## 2020-05-21 NOTE — Patient Instructions (Signed)

## 2020-05-21 NOTE — Patient Instructions (Signed)
[facility] Discharge Instructions for Patients Receiving Chemotherapy  Today you received the following chemotherapy agent: Carfilzomib (Kyprolis)  To help prevent nausea and vomiting after your treatment, we encourage you to take your nausea medication as directed by your MD.   If you develop nausea and vomiting that is not controlled by your nausea medication, call the clinic.   BELOW ARE SYMPTOMS THAT SHOULD BE REPORTED IMMEDIATELY:  *FEVER GREATER THAN 100.5 F  *CHILLS WITH OR WITHOUT FEVER  NAUSEA AND VOMITING THAT IS NOT CONTROLLED WITH YOUR NAUSEA MEDICATION  *UNUSUAL SHORTNESS OF BREATH  *UNUSUAL BRUISING OR BLEEDING  TENDERNESS IN MOUTH AND THROAT WITH OR WITHOUT PRESENCE OF ULCERS  *URINARY PROBLEMS  *BOWEL PROBLEMS  UNUSUAL RASH Items with * indicate a potential emergency and should be followed up as soon as possible.  Feel free to call the clinic should you have any questions or concerns. The clinic phone number is [phone].  Please show the CHEMO ALERT CARD at check-in to the Emergency Department and triage nurse.   

## 2020-05-22 ENCOUNTER — Ambulatory Visit: Payer: Medicare Other | Admitting: Obstetrics and Gynecology

## 2020-05-24 ENCOUNTER — Other Ambulatory Visit: Payer: Self-pay | Admitting: Internal Medicine

## 2020-06-02 ENCOUNTER — Encounter: Payer: Self-pay | Admitting: Hematology

## 2020-06-03 ENCOUNTER — Other Ambulatory Visit: Payer: Self-pay | Admitting: *Deleted

## 2020-06-03 DIAGNOSIS — C9000 Multiple myeloma not having achieved remission: Secondary | ICD-10-CM

## 2020-06-03 MED ORDER — FENTANYL 12 MCG/HR TD PT72: 1.0000 | MEDICATED_PATCH | TRANSDERMAL | 0 refills | Status: DC

## 2020-06-03 MED ORDER — OXYCODONE HCL 10 MG PO TABS: 10.0000 mg | ORAL_TABLET | Freq: Four times a day (QID) | ORAL | 0 refills | Status: DC | PRN

## 2020-06-03 NOTE — Telephone Encounter (Signed)
Requested refill of fentanyl and oxycodone via My Chart.

## 2020-06-04 ENCOUNTER — Other Ambulatory Visit: Payer: Self-pay

## 2020-06-04 ENCOUNTER — Inpatient Hospital Stay: Payer: Medicare PPO

## 2020-06-04 ENCOUNTER — Other Ambulatory Visit: Payer: Self-pay | Admitting: Hematology

## 2020-06-04 VITALS — BP 131/68 | HR 84 | Temp 98.9°F | Resp 18

## 2020-06-04 DIAGNOSIS — Z95828 Presence of other vascular implants and grafts: Secondary | ICD-10-CM

## 2020-06-04 DIAGNOSIS — Z7189 Other specified counseling: Secondary | ICD-10-CM

## 2020-06-04 DIAGNOSIS — Z5112 Encounter for antineoplastic immunotherapy: Secondary | ICD-10-CM | POA: Diagnosis not present

## 2020-06-04 DIAGNOSIS — C9000 Multiple myeloma not having achieved remission: Secondary | ICD-10-CM

## 2020-06-04 LAB — CMP (CANCER CENTER ONLY)
ALT: 19 U/L (ref 0–44)
AST: 12 U/L — ABNORMAL LOW (ref 15–41)
Albumin: 3.3 g/dL — ABNORMAL LOW (ref 3.5–5.0)
Alkaline Phosphatase: 76 U/L (ref 38–126)
Anion gap: 12 (ref 5–15)
BUN: 13 mg/dL (ref 8–23)
CO2: 19 mmol/L — ABNORMAL LOW (ref 22–32)
Calcium: 9.4 mg/dL (ref 8.9–10.3)
Chloride: 108 mmol/L (ref 98–111)
Creatinine: 1.25 mg/dL — ABNORMAL HIGH (ref 0.44–1.00)
GFR, Est AFR Am: 48 mL/min — ABNORMAL LOW (ref >60)
GFR, Estimated: 42 mL/min — ABNORMAL LOW (ref >60)
Glucose, Bld: 222 mg/dL — ABNORMAL HIGH (ref 70–99)
Potassium: 3.5 mmol/L (ref 3.5–5.1)
Sodium: 139 mmol/L (ref 135–145)
Total Bilirubin: 0.6 mg/dL (ref 0.3–1.2)
Total Protein: 6 g/dL — ABNORMAL LOW (ref 6.5–8.1)

## 2020-06-04 LAB — CBC WITH DIFFERENTIAL/PLATELET
Abs Immature Granulocytes: 0.04 10*3/uL (ref 0.00–0.07)
Basophils Absolute: 0.1 10*3/uL (ref 0.0–0.1)
Basophils Relative: 1 %
Eosinophils Absolute: 0.5 10*3/uL (ref 0.0–0.5)
Eosinophils Relative: 9 %
HCT: 37.2 % (ref 36.0–46.0)
Hemoglobin: 12.5 g/dL (ref 12.0–15.0)
Immature Granulocytes: 1 %
Lymphocytes Relative: 18 %
Lymphs Abs: 1.1 10*3/uL (ref 0.7–4.0)
MCH: 33.5 pg (ref 26.0–34.0)
MCHC: 33.6 g/dL (ref 30.0–36.0)
MCV: 99.7 fL (ref 80.0–100.0)
Monocytes Absolute: 1.1 10*3/uL — ABNORMAL HIGH (ref 0.1–1.0)
Monocytes Relative: 19 %
Neutro Abs: 3.2 10*3/uL (ref 1.7–7.7)
Neutrophils Relative %: 52 %
Platelets: 157 10*3/uL (ref 150–400)
RBC: 3.73 MIL/uL — ABNORMAL LOW (ref 3.87–5.11)
RDW: 14.1 % (ref 11.5–15.5)
WBC: 6.1 10*3/uL (ref 4.0–10.5)
nRBC: 0 % (ref 0.0–0.2)

## 2020-06-04 MED ORDER — ZOLEDRONIC ACID 4 MG/100ML IV SOLN
INTRAVENOUS | Status: AC
  Filled 2020-06-04: qty 100

## 2020-06-04 MED ORDER — DIPHENHYDRAMINE HCL 25 MG PO TABS
25.0000 mg | ORAL_TABLET | Freq: Once | ORAL | Status: AC
  Administered 2020-06-04: 25 mg via ORAL
  Filled 2020-06-04: qty 1

## 2020-06-04 MED ORDER — DEXTROSE 5 % IV SOLN
56.0000 mg/m2 | Freq: Once | INTRAVENOUS | Status: AC
  Administered 2020-06-04: 100 mg via INTRAVENOUS
  Filled 2020-06-04: qty 30

## 2020-06-04 MED ORDER — SODIUM CHLORIDE 0.9% FLUSH
10.0000 mL | Freq: Once | INTRAVENOUS | Status: AC
  Administered 2020-06-04: 10 mL
  Filled 2020-06-04: qty 10

## 2020-06-04 MED ORDER — FAMOTIDINE 20 MG PO TABS
ORAL_TABLET | ORAL | Status: AC
  Filled 2020-06-04: qty 1

## 2020-06-04 MED ORDER — PROCHLORPERAZINE MALEATE 10 MG PO TABS
ORAL_TABLET | ORAL | Status: AC
  Filled 2020-06-04: qty 1

## 2020-06-04 MED ORDER — PROCHLORPERAZINE MALEATE 10 MG PO TABS
10.0000 mg | ORAL_TABLET | Freq: Once | ORAL | Status: AC
  Administered 2020-06-04: 10 mg via ORAL

## 2020-06-04 MED ORDER — DEXAMETHASONE 4 MG PO TABS
20.0000 mg | ORAL_TABLET | Freq: Once | ORAL | Status: AC
  Administered 2020-06-04: 20 mg via ORAL

## 2020-06-04 MED ORDER — FAMOTIDINE IN NACL 20-0.9 MG/50ML-% IV SOLN
INTRAVENOUS | Status: AC
  Filled 2020-06-04: qty 50

## 2020-06-04 MED ORDER — ZOLEDRONIC ACID 4 MG/100ML IV SOLN: 4.0000 mg | Freq: Once | INTRAVENOUS | Status: DC

## 2020-06-04 MED ORDER — SODIUM CHLORIDE 0.9 % IV SOLN
Freq: Once | INTRAVENOUS | Status: AC
  Filled 2020-06-04: qty 250

## 2020-06-04 MED ORDER — FAMOTIDINE 20 MG PO TABS
20.0000 mg | ORAL_TABLET | Freq: Once | ORAL | Status: AC
  Administered 2020-06-04: 20 mg via ORAL

## 2020-06-04 MED ORDER — SODIUM CHLORIDE 0.9% FLUSH
10.0000 mL | INTRAVENOUS | Status: DC | PRN
  Administered 2020-06-04: 10 mL
  Filled 2020-06-04: qty 10

## 2020-06-04 MED ORDER — ACETAMINOPHEN 325 MG PO TABS
650.0000 mg | ORAL_TABLET | Freq: Once | ORAL | Status: AC
  Administered 2020-06-04: 650 mg via ORAL

## 2020-06-04 MED ORDER — ACETAMINOPHEN 325 MG PO TABS
ORAL_TABLET | ORAL | Status: AC
  Filled 2020-06-04: qty 2

## 2020-06-04 MED ORDER — DIPHENHYDRAMINE HCL 25 MG PO CAPS
ORAL_CAPSULE | ORAL | Status: AC
  Filled 2020-06-04: qty 1

## 2020-06-04 MED ORDER — HEPARIN SOD (PORK) LOCK FLUSH 100 UNIT/ML IV SOLN
500.0000 [IU] | Freq: Once | INTRAVENOUS | Status: AC | PRN
  Administered 2020-06-04: 500 [IU]
  Filled 2020-06-04: qty 5

## 2020-06-04 MED ORDER — DEXAMETHASONE 4 MG PO TABS
ORAL_TABLET | ORAL | Status: AC
  Filled 2020-06-04: qty 5

## 2020-06-04 NOTE — Patient Instructions (Signed)
[facility] Discharge Instructions for Patients Receiving Chemotherapy  Today you received the following chemotherapy agent: Carfilzomib (Kyprolis)  To help prevent nausea and vomiting after your treatment, we encourage you to take your nausea medication as directed by your MD.   If you develop nausea and vomiting that is not controlled by your nausea medication, call the clinic.   BELOW ARE SYMPTOMS THAT SHOULD BE REPORTED IMMEDIATELY:  *FEVER GREATER THAN 100.5 F  *CHILLS WITH OR WITHOUT FEVER  NAUSEA AND VOMITING THAT IS NOT CONTROLLED WITH YOUR NAUSEA MEDICATION  *UNUSUAL SHORTNESS OF BREATH  *UNUSUAL BRUISING OR BLEEDING  TENDERNESS IN MOUTH AND THROAT WITH OR WITHOUT PRESENCE OF ULCERS  *URINARY PROBLEMS  *BOWEL PROBLEMS  UNUSUAL RASH Items with * indicate a potential emergency and should be followed up as soon as possible.  Feel free to call the clinic should you have any questions or concerns. The clinic phone number is [phone].  Please show the CHEMO ALERT CARD at check-in to the Emergency Department and triage nurse.   

## 2020-06-04 NOTE — Telephone Encounter (Signed)
Refilled per MD office visit note 05/21/20 [employer] auth# [id number], 06/04/20

## 2020-06-06 ENCOUNTER — Telehealth: Payer: Self-pay | Admitting: Hematology

## 2020-06-06 NOTE — Telephone Encounter (Signed)
Called pt per 6/24 sch message - no answer - left message for patient to call back if reschedule is needed.

## 2020-06-07 ENCOUNTER — Encounter: Payer: Self-pay | Admitting: *Deleted

## 2020-06-07 ENCOUNTER — Encounter: Payer: Self-pay | Admitting: Hematology

## 2020-06-14 ENCOUNTER — Other Ambulatory Visit: Payer: Self-pay | Admitting: Internal Medicine

## 2020-06-18 ENCOUNTER — Inpatient Hospital Stay: Payer: Medicare PPO | Attending: Hematology

## 2020-06-18 ENCOUNTER — Inpatient Hospital Stay: Payer: Medicare PPO

## 2020-06-18 ENCOUNTER — Other Ambulatory Visit: Payer: Self-pay

## 2020-06-18 VITALS — BP 147/63 | HR 79 | Temp 98.2°F | Resp 16

## 2020-06-18 DIAGNOSIS — Z7189 Other specified counseling: Secondary | ICD-10-CM

## 2020-06-18 DIAGNOSIS — Z95828 Presence of other vascular implants and grafts: Secondary | ICD-10-CM

## 2020-06-18 DIAGNOSIS — C9000 Multiple myeloma not having achieved remission: Secondary | ICD-10-CM | POA: Diagnosis not present

## 2020-06-18 DIAGNOSIS — Z5112 Encounter for antineoplastic immunotherapy: Secondary | ICD-10-CM | POA: Diagnosis not present

## 2020-06-18 LAB — CBC WITH DIFFERENTIAL/PLATELET
Abs Immature Granulocytes: 0.02 10*3/uL (ref 0.00–0.07)
Basophils Absolute: 0.1 10*3/uL (ref 0.0–0.1)
Basophils Relative: 2 %
Eosinophils Absolute: 0.2 10*3/uL (ref 0.0–0.5)
Eosinophils Relative: 3 %
HCT: 37.1 % (ref 36.0–46.0)
Hemoglobin: 12.4 g/dL (ref 12.0–15.0)
Immature Granulocytes: 0 %
Lymphocytes Relative: 22 %
Lymphs Abs: 1.1 10*3/uL (ref 0.7–4.0)
MCH: 33.7 pg (ref 26.0–34.0)
MCHC: 33.4 g/dL (ref 30.0–36.0)
MCV: 100.8 fL — ABNORMAL HIGH (ref 80.0–100.0)
Monocytes Absolute: 1.1 10*3/uL — ABNORMAL HIGH (ref 0.1–1.0)
Monocytes Relative: 23 %
Neutro Abs: 2.3 10*3/uL (ref 1.7–7.7)
Neutrophils Relative %: 50 %
Platelets: 186 10*3/uL (ref 150–400)
RBC: 3.68 MIL/uL — ABNORMAL LOW (ref 3.87–5.11)
RDW: 14 % (ref 11.5–15.5)
WBC: 4.7 10*3/uL (ref 4.0–10.5)
nRBC: 0 % (ref 0.0–0.2)

## 2020-06-18 LAB — CMP (CANCER CENTER ONLY)
ALT: 17 U/L (ref 0–44)
AST: 14 U/L — ABNORMAL LOW (ref 15–41)
Albumin: 3.4 g/dL — ABNORMAL LOW (ref 3.5–5.0)
Alkaline Phosphatase: 71 U/L (ref 38–126)
Anion gap: 12 (ref 5–15)
BUN: 14 mg/dL (ref 8–23)
CO2: 22 mmol/L (ref 22–32)
Calcium: 10.2 mg/dL (ref 8.9–10.3)
Chloride: 106 mmol/L (ref 98–111)
Creatinine: 1.01 mg/dL — ABNORMAL HIGH (ref 0.44–1.00)
GFR, Est AFR Am: >60 mL/min (ref >60)
GFR, Estimated: 54 mL/min — ABNORMAL LOW (ref >60)
Glucose, Bld: 171 mg/dL — ABNORMAL HIGH (ref 70–99)
Potassium: 3.6 mmol/L (ref 3.5–5.1)
Sodium: 140 mmol/L (ref 135–145)
Total Bilirubin: 0.7 mg/dL (ref 0.3–1.2)
Total Protein: 6.3 g/dL — ABNORMAL LOW (ref 6.5–8.1)

## 2020-06-18 MED ORDER — DIPHENHYDRAMINE HCL 25 MG PO CAPS
ORAL_CAPSULE | ORAL | Status: AC
  Filled 2020-06-18: qty 1

## 2020-06-18 MED ORDER — SODIUM CHLORIDE 0.9% FLUSH
10.0000 mL | INTRAVENOUS | Status: DC | PRN
  Administered 2020-06-18: 10 mL
  Filled 2020-06-18: qty 10

## 2020-06-18 MED ORDER — DEXTROSE 5 % IV SOLN
56.0000 mg/m2 | Freq: Once | INTRAVENOUS | Status: AC
  Administered 2020-06-18: 100 mg via INTRAVENOUS
  Filled 2020-06-18: qty 30

## 2020-06-18 MED ORDER — HEPARIN SOD (PORK) LOCK FLUSH 100 UNIT/ML IV SOLN
500.0000 [IU] | Freq: Once | INTRAVENOUS | Status: AC | PRN
  Administered 2020-06-18: 500 [IU]
  Filled 2020-06-18: qty 5

## 2020-06-18 MED ORDER — PROCHLORPERAZINE MALEATE 10 MG PO TABS
ORAL_TABLET | ORAL | Status: AC
  Filled 2020-06-18: qty 1

## 2020-06-18 MED ORDER — SODIUM CHLORIDE 0.9 % IV SOLN
Freq: Once | INTRAVENOUS | Status: AC
  Filled 2020-06-18: qty 250

## 2020-06-18 MED ORDER — SODIUM CHLORIDE 0.9% FLUSH
10.0000 mL | Freq: Once | INTRAVENOUS | Status: AC
  Administered 2020-06-18: 10 mL
  Filled 2020-06-18: qty 10

## 2020-06-18 MED ORDER — DIPHENHYDRAMINE HCL 25 MG PO TABS
25.0000 mg | ORAL_TABLET | Freq: Once | ORAL | Status: AC
  Administered 2020-06-18: 25 mg via ORAL
  Filled 2020-06-18: qty 1

## 2020-06-18 MED ORDER — ACETAMINOPHEN 325 MG PO TABS
650.0000 mg | ORAL_TABLET | Freq: Once | ORAL | Status: AC
  Administered 2020-06-18: 650 mg via ORAL

## 2020-06-18 MED ORDER — PROCHLORPERAZINE MALEATE 10 MG PO TABS
10.0000 mg | ORAL_TABLET | Freq: Once | ORAL | Status: AC
  Administered 2020-06-18: 10 mg via ORAL

## 2020-06-18 MED ORDER — FAMOTIDINE 20 MG PO TABS
ORAL_TABLET | ORAL | Status: AC
  Filled 2020-06-18: qty 1

## 2020-06-18 MED ORDER — ACETAMINOPHEN 325 MG PO TABS
ORAL_TABLET | ORAL | Status: AC
  Filled 2020-06-18: qty 2

## 2020-06-18 MED ORDER — DEXAMETHASONE 4 MG PO TABS
20.0000 mg | ORAL_TABLET | Freq: Once | ORAL | Status: AC
  Administered 2020-06-18: 20 mg via ORAL

## 2020-06-18 MED ORDER — ZOLEDRONIC ACID 4 MG/100ML IV SOLN
4.0000 mg | Freq: Once | INTRAVENOUS | Status: AC
  Administered 2020-06-18: 4 mg via INTRAVENOUS

## 2020-06-18 MED ORDER — FAMOTIDINE 20 MG PO TABS
20.0000 mg | ORAL_TABLET | Freq: Once | ORAL | Status: AC
  Administered 2020-06-18: 20 mg via ORAL

## 2020-06-18 MED ORDER — DEXAMETHASONE 4 MG PO TABS
ORAL_TABLET | ORAL | Status: AC
  Filled 2020-06-18: qty 5

## 2020-06-18 MED ORDER — ZOLEDRONIC ACID 4 MG/100ML IV SOLN
INTRAVENOUS | Status: AC
  Filled 2020-06-18: qty 100

## 2020-06-18 NOTE — Patient Instructions (Signed)
[facility] Discharge Instructions for Patients Receiving Chemotherapy  Today you received the following chemotherapy agents: carfilzomib.  To help prevent nausea and vomiting after your treatment, we encourage you to take your nausea medication as directed.   If you develop nausea and vomiting that is not controlled by your nausea medication, call the clinic.   BELOW ARE SYMPTOMS THAT SHOULD BE REPORTED IMMEDIATELY:  *FEVER GREATER THAN 100.5 F  *CHILLS WITH OR WITHOUT FEVER  NAUSEA AND VOMITING THAT IS NOT CONTROLLED WITH YOUR NAUSEA MEDICATION  *UNUSUAL SHORTNESS OF BREATH  *UNUSUAL BRUISING OR BLEEDING  TENDERNESS IN MOUTH AND THROAT WITH OR WITHOUT PRESENCE OF ULCERS  *URINARY PROBLEMS  *BOWEL PROBLEMS  UNUSUAL RASH Items with * indicate a potential emergency and should be followed up as soon as possible.  Feel free to call the clinic should you have any questions or concerns. The clinic phone number is [phone].  Please show the CHEMO ALERT CARD at check-in to the Emergency Department and triage nurse.   

## 2020-06-19 LAB — KAPPA/LAMBDA LIGHT CHAINS
Kappa free light chain: 15.3 mg/L (ref 3.3–19.4)
Kappa, lambda light chain ratio: 1.31 (ref 0.26–1.65)
Lambda free light chains: 11.7 mg/L (ref 5.7–26.3)

## 2020-06-20 LAB — MULTIPLE MYELOMA PANEL, SERUM
Albumin SerPl Elph-Mcnc: 3.4 g/dL (ref 2.9–4.4)
Albumin/Glob SerPl: 1.5 (ref 0.7–1.7)
Alpha 1: 0.2 g/dL (ref 0.0–0.4)
Alpha2 Glob SerPl Elph-Mcnc: 0.8 g/dL (ref 0.4–1.0)
B-Globulin SerPl Elph-Mcnc: 0.9 g/dL (ref 0.7–1.3)
Gamma Glob SerPl Elph-Mcnc: 0.5 g/dL (ref 0.4–1.8)
Globulin, Total: 2.3 g/dL (ref 2.2–3.9)
IgA: 161 mg/dL (ref 64–422)
IgG (Immunoglobin G), Serum: 539 mg/dL — ABNORMAL LOW (ref 586–1602)
IgM (Immunoglobulin M), Srm: 12 mg/dL — ABNORMAL LOW (ref 26–217)
Total Protein ELP: 5.7 g/dL — ABNORMAL LOW (ref 6.0–8.5)

## 2020-07-02 ENCOUNTER — Other Ambulatory Visit: Payer: Self-pay | Admitting: Hematology

## 2020-07-02 ENCOUNTER — Ambulatory Visit: Payer: Medicare PPO | Admitting: Hematology

## 2020-07-02 ENCOUNTER — Ambulatory Visit: Payer: Medicare PPO

## 2020-07-02 ENCOUNTER — Other Ambulatory Visit: Payer: Medicare PPO

## 2020-07-02 DIAGNOSIS — C9000 Multiple myeloma not having achieved remission: Secondary | ICD-10-CM

## 2020-07-02 NOTE — Telephone Encounter (Signed)
Refilled per Dr. Grier Mitts OV note 05/21/20

## 2020-07-09 ENCOUNTER — Other Ambulatory Visit: Payer: Self-pay | Admitting: Hematology

## 2020-07-10 ENCOUNTER — Encounter: Payer: Self-pay | Admitting: Hematology

## 2020-07-10 ENCOUNTER — Other Ambulatory Visit: Payer: Self-pay | Admitting: *Deleted

## 2020-07-10 DIAGNOSIS — C9000 Multiple myeloma not having achieved remission: Secondary | ICD-10-CM

## 2020-07-10 MED ORDER — FENTANYL 12 MCG/HR TD PT72: 1.0000 | MEDICATED_PATCH | TRANSDERMAL | 0 refills | Status: DC

## 2020-07-12 ENCOUNTER — Encounter: Payer: Self-pay | Admitting: Hematology

## 2020-07-15 ENCOUNTER — Other Ambulatory Visit: Payer: Self-pay | Admitting: *Deleted

## 2020-07-15 DIAGNOSIS — C9000 Multiple myeloma not having achieved remission: Secondary | ICD-10-CM

## 2020-07-15 MED ORDER — OXYCODONE HCL 10 MG PO TABS: 10.0000 mg | ORAL_TABLET | Freq: Four times a day (QID) | ORAL | 0 refills | Status: DC | PRN

## 2020-07-15 MED ORDER — VITAMIN D (ERGOCALCIFEROL) 1.25 MG (50000 UNIT) PO CAPS: ORAL_CAPSULE | ORAL | 0 refills | Status: DC

## 2020-07-15 NOTE — Telephone Encounter (Signed)
Also requested refill of Vitamin D

## 2020-07-15 NOTE — Telephone Encounter (Signed)
Refill requested for vitamin D

## 2020-07-15 NOTE — Telephone Encounter (Signed)
Requested refill of Oxycodone via MyChart - routed to Dr. Irene Limbo

## 2020-07-16 ENCOUNTER — Inpatient Hospital Stay: Payer: Medicare PPO | Attending: Hematology

## 2020-07-16 ENCOUNTER — Inpatient Hospital Stay: Payer: Medicare PPO

## 2020-07-16 ENCOUNTER — Other Ambulatory Visit: Payer: Self-pay | Admitting: Hematology

## 2020-07-16 ENCOUNTER — Other Ambulatory Visit: Payer: Self-pay

## 2020-07-16 ENCOUNTER — Inpatient Hospital Stay: Payer: Medicare PPO | Admitting: Hematology

## 2020-07-16 ENCOUNTER — Telehealth: Payer: Self-pay | Admitting: *Deleted

## 2020-07-16 VITALS — BP 125/67 | HR 73 | Temp 97.3°F | Resp 17 | Ht 59.0 in | Wt 159.6 lb

## 2020-07-16 DIAGNOSIS — Z5112 Encounter for antineoplastic immunotherapy: Secondary | ICD-10-CM | POA: Insufficient documentation

## 2020-07-16 DIAGNOSIS — C9000 Multiple myeloma not having achieved remission: Secondary | ICD-10-CM

## 2020-07-16 DIAGNOSIS — Z7189 Other specified counseling: Secondary | ICD-10-CM

## 2020-07-16 DIAGNOSIS — R911 Solitary pulmonary nodule: Secondary | ICD-10-CM | POA: Insufficient documentation

## 2020-07-16 DIAGNOSIS — R0789 Other chest pain: Secondary | ICD-10-CM

## 2020-07-16 DIAGNOSIS — Z800 Family history of malignant neoplasm of digestive organs: Secondary | ICD-10-CM | POA: Insufficient documentation

## 2020-07-16 DIAGNOSIS — M549 Dorsalgia, unspecified: Secondary | ICD-10-CM | POA: Insufficient documentation

## 2020-07-16 DIAGNOSIS — Z95828 Presence of other vascular implants and grafts: Secondary | ICD-10-CM

## 2020-07-16 DIAGNOSIS — Z5111 Encounter for antineoplastic chemotherapy: Secondary | ICD-10-CM

## 2020-07-16 DIAGNOSIS — Z79899 Other long term (current) drug therapy: Secondary | ICD-10-CM | POA: Insufficient documentation

## 2020-07-16 LAB — CMP (CANCER CENTER ONLY)
ALT: 12 U/L (ref 0–44)
AST: 13 U/L — ABNORMAL LOW (ref 15–41)
Albumin: 3.3 g/dL — ABNORMAL LOW (ref 3.5–5.0)
Alkaline Phosphatase: 72 U/L (ref 38–126)
Anion gap: 10 (ref 5–15)
BUN: 18 mg/dL (ref 8–23)
CO2: 24 mmol/L (ref 22–32)
Calcium: 10.6 mg/dL — ABNORMAL HIGH (ref 8.9–10.3)
Chloride: 106 mmol/L (ref 98–111)
Creatinine: 1.03 mg/dL — ABNORMAL HIGH (ref 0.44–1.00)
GFR, Est AFR Am: >60 mL/min (ref >60)
GFR, Estimated: 52 mL/min — ABNORMAL LOW (ref >60)
Glucose, Bld: 207 mg/dL — ABNORMAL HIGH (ref 70–99)
Potassium: 3.9 mmol/L (ref 3.5–5.1)
Sodium: 140 mmol/L (ref 135–145)
Total Bilirubin: 0.4 mg/dL (ref 0.3–1.2)
Total Protein: 6.7 g/dL (ref 6.5–8.1)

## 2020-07-16 LAB — CBC WITH DIFFERENTIAL/PLATELET
Abs Immature Granulocytes: 0.01 10*3/uL (ref 0.00–0.07)
Basophils Absolute: 0 10*3/uL (ref 0.0–0.1)
Basophils Relative: 1 %
Eosinophils Absolute: 0.1 10*3/uL (ref 0.0–0.5)
Eosinophils Relative: 2 %
HCT: 37.6 % (ref 36.0–46.0)
Hemoglobin: 12.6 g/dL (ref 12.0–15.0)
Immature Granulocytes: 0 %
Lymphocytes Relative: 35 %
Lymphs Abs: 1.2 10*3/uL (ref 0.7–4.0)
MCH: 33.6 pg (ref 26.0–34.0)
MCHC: 33.5 g/dL (ref 30.0–36.0)
MCV: 100.3 fL — ABNORMAL HIGH (ref 80.0–100.0)
Monocytes Absolute: 0.7 10*3/uL (ref 0.1–1.0)
Monocytes Relative: 20 %
Neutro Abs: 1.4 10*3/uL — ABNORMAL LOW (ref 1.7–7.7)
Neutrophils Relative %: 42 %
Platelets: 177 10*3/uL (ref 150–400)
RBC: 3.75 MIL/uL — ABNORMAL LOW (ref 3.87–5.11)
RDW: 13.4 % (ref 11.5–15.5)
WBC: 3.4 10*3/uL — ABNORMAL LOW (ref 4.0–10.5)
nRBC: 0 % (ref 0.0–0.2)

## 2020-07-16 LAB — D-DIMER, QUANTITATIVE: D-Dimer, Quant: 1.28 ug/mL-FEU — ABNORMAL HIGH (ref 0.00–0.50)

## 2020-07-16 MED ORDER — DIPHENHYDRAMINE HCL 25 MG PO TABS
25.0000 mg | ORAL_TABLET | Freq: Once | ORAL | Status: AC
  Administered 2020-07-16: 25 mg via ORAL
  Filled 2020-07-16: qty 1

## 2020-07-16 MED ORDER — DIPHENHYDRAMINE HCL 25 MG PO CAPS
ORAL_CAPSULE | ORAL | Status: AC
  Filled 2020-07-16: qty 1

## 2020-07-16 MED ORDER — DEXAMETHASONE 4 MG PO TABS
ORAL_TABLET | ORAL | Status: AC
  Filled 2020-07-16: qty 5

## 2020-07-16 MED ORDER — ACETAMINOPHEN 325 MG PO TABS
650.0000 mg | ORAL_TABLET | Freq: Once | ORAL | Status: AC
  Administered 2020-07-16: 650 mg via ORAL

## 2020-07-16 MED ORDER — ZOLEDRONIC ACID 4 MG/100ML IV SOLN
INTRAVENOUS | Status: AC
  Filled 2020-07-16: qty 100

## 2020-07-16 MED ORDER — SODIUM CHLORIDE 0.9% FLUSH
10.0000 mL | INTRAVENOUS | Status: DC | PRN
  Filled 2020-07-16: qty 10

## 2020-07-16 MED ORDER — DEXAMETHASONE 4 MG PO TABS
20.0000 mg | ORAL_TABLET | Freq: Once | ORAL | Status: AC
  Administered 2020-07-16: 20 mg via ORAL

## 2020-07-16 MED ORDER — SODIUM CHLORIDE 0.9% FLUSH
10.0000 mL | Freq: Once | INTRAVENOUS | Status: AC
  Administered 2020-07-16: 10 mL
  Filled 2020-07-16: qty 10

## 2020-07-16 MED ORDER — FAMOTIDINE 20 MG PO TABS
20.0000 mg | ORAL_TABLET | Freq: Once | ORAL | Status: AC
  Administered 2020-07-16: 20 mg via ORAL

## 2020-07-16 MED ORDER — APIXABAN 5 MG PO TABS: 5.0000 mg | ORAL_TABLET | Freq: Two times a day (BID) | ORAL | 0 refills | Status: DC

## 2020-07-16 MED ORDER — ZOLEDRONIC ACID 4 MG/100ML IV SOLN
4.0000 mg | Freq: Once | INTRAVENOUS | Status: AC
  Administered 2020-07-16: 4 mg via INTRAVENOUS

## 2020-07-16 MED ORDER — SODIUM CHLORIDE 0.9 % IV SOLN
Freq: Once | INTRAVENOUS | Status: AC
  Filled 2020-07-16: qty 250

## 2020-07-16 MED ORDER — PROCHLORPERAZINE MALEATE 10 MG PO TABS
10.0000 mg | ORAL_TABLET | Freq: Once | ORAL | Status: AC
  Administered 2020-07-16: 10 mg via ORAL

## 2020-07-16 MED ORDER — ACETAMINOPHEN 325 MG PO TABS
ORAL_TABLET | ORAL | Status: AC
  Filled 2020-07-16: qty 2

## 2020-07-16 MED ORDER — FAMOTIDINE 20 MG PO TABS
ORAL_TABLET | ORAL | Status: AC
  Filled 2020-07-16: qty 1

## 2020-07-16 MED ORDER — SODIUM CHLORIDE 0.9 % IV SOLN
Freq: Once | INTRAVENOUS | Status: DC
  Filled 2020-07-16: qty 250

## 2020-07-16 MED ORDER — DEXTROSE 5 % IV SOLN
56.0000 mg/m2 | Freq: Once | INTRAVENOUS | Status: AC
  Administered 2020-07-16: 100 mg via INTRAVENOUS
  Filled 2020-07-16: qty 30

## 2020-07-16 MED ORDER — PROCHLORPERAZINE MALEATE 10 MG PO TABS
ORAL_TABLET | ORAL | Status: AC
  Filled 2020-07-16: qty 1

## 2020-07-16 MED ORDER — HEPARIN SOD (PORK) LOCK FLUSH 100 UNIT/ML IV SOLN
500.0000 [IU] | Freq: Once | INTRAVENOUS | Status: DC | PRN
  Filled 2020-07-16: qty 5

## 2020-07-16 NOTE — Progress Notes (Signed)
Verbal order from Dr. Irene Limbo: Faith Orr to receive Kyprolis today with ANC 1.4

## 2020-07-16 NOTE — Progress Notes (Signed)
HEMATOLOGY/ONCOLOGY CLINIC NOTE  Date of Service: 07/16/2020  Patient Care Team: Hoyt Koch, MD as PCP - General (Internal Medicine) Lafayette Dragon, MD (Inactive) as Consulting Physician (Gastroenterology) Megan Salon, MD as Consulting Physician (Gynecology) Melrose Nakayama, MD as Consulting Physician (Orthopedic Surgery) Deneise Lever, MD as Consulting Physician (Pulmonary Disease) [name], MD (Ophthalmology)  CHIEF COMPLAINTS/PURPOSE OF CONSULTATION:  Continue mx of myeloma  HISTORY OF PRESENTING ILLNESS:   Faith Orr is a wonderful 79 y.o. female who has been referred to [name] by Dr. Pricilla Holm for evaluation and management of Lytic bone lesions. She is accompanied today by her son in law, and her partner is present via phone. The pt reports that she is doing well overall.   The pt notes that 2-3 months ago while standing at the stove, and otherwise feeling normally, she felt "something snap that took her breath away" in her mid back as she stretched to get something. The pt notes that she saw a chiropractor twice due to her back stiffness, which did not help. The pt then described her new bone pains to her PCP on 12/05/18, and subsequent imaging, as noted below, revealed concerns for numerous bone lesions. She has begun 57m Fosamax. The pt notes that she had hip pain in 2018, and that an XR at that time did not reveal any lesions.  The pt notes that most of her pain is concentrated to her left shoulder presently, and with minimal movement of the arm. She endorses pain radiating into her left arm and notes that her hand is swollen in the mornings when she wakes up. She also endorses present back pain and right hip pain, worse when she walks. She has not yet seen orthopedics. The pt reports that she is needing to take 6064mAdvil every 4-6 hours as Tramadol alone has not been able to alleviate her pain. The pt denies any unexpected weight loss, fevers,  chills, or night sweats. The pt notes that her urine is a very dark color presently, but denies overt blood in the urine, underpants, nor tissue paper. The pt notes that in the last two weeks she has had some soreness in her head, but denies new headaches or changes in vision. The pt notes that she has been urinating more frequently overall, but has been trying to stay better hydrated as well.  The pt notes that she has been compliant with annual mammograms.   The pt notes that she had a cyst in her right breast which was removed in the past. She fractured her left wrist in 2008 after falling down stairs. The pt endorses history of fatty liver.   The pt denies ever smoking cigarettes and endorses significant second hand smoke exposure with a previous marriage.   Of note prior to the patient's visit today, pt has had a Bone Scan completed on 12/13/18 with results revealing Multifocal uptake throughout the skeleton, consistent with diffuse metastatic disease. Primary tumor is not specified. 2. Uptake in the proximal right femur, consistent with lytic lesions. 3. Uptake in the ribs bilaterally as described. 4. Lesions in the proximal left humerus. 5. Diffuse uptake throughout the skull consistent with metastatic disease. 6. Right paramedian uptake at the manubrium.  Most recent lab results (12/08/18) of CBC w/diff and CMP is as follows: all values are WNL except for Glucose at 279, BUN at 24, AST at 41, ALT at 46. 12/08/18 SPEP revealed all values WNL except for Total  Protein at 6.0, Albumin at 3.6, Gamma globulin at 0.7, and M spike at 0.5g  On review of systems, pt reports significant left shoulder pain, back pain, right hip pain, dark urine, and denies fevers, chills, night sweats, unexpected weight loss, changes in bowel habits, changes in breathing, cough, new respiratory symptoms, changes in vision, abdominal pains, leg swelling, and any other symptoms.   On PMHx the pt reports fatty liver, and  denies blood clots.  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 Denies ever smoking.  On Family Hx the pt reports maternal grandmother with colon cancer. Father with bladder cancer and amyloidosis (pt notes that this could have been misdiagnosis). Mother with Protein S deficiency and polymyalgia rheumatica.  Current Treatment: Carfilzomib + Revlimid maintenance.  Interval History:  Faith Orr returns today for management and evaluation of her Multiple Myeloma and C19D1 of maintenance Carfilzomib, Revlimid, and Dexamethasone. The patient's last visit with [name] was on 05/21/2020. The pt reports that she is doing well overall.  The pt reports that three days ago she awoke with a pain along the top of her back. The pain is worsened with movement or reaching and was improved with Tylenol. She denies any recent injuries or accidents. Pt has had no shortness of breath, but notes that the area becomes uncomfortable with deep breaths. Pt notes that her back began to feel better this morning. She has had some lower leg cramping in the interim.   Lab results today (07/16/20) of CBC w/diff and CMP is as follows: all values are WNL except for WBC at 3.4K, RBC at 3.75, MCV at 100.3, Neutro Abs at 1.4K, Glucose at 207, Creatinine at 1.03, Calcium at 10.6, Albumin at 3.3 AST at 13, GFR Est Non Af Am at 52. 07/16/2020 D-dimer at 1.28  On review of systems, pt reports upper back pain, muscle cramping and denies SOB, fevers, chills, tingling/numbness in hands/feet, abdominal pain and any other symptoms.   MEDICAL HISTORY:  Past Medical History:  Diagnosis Date  . Allergy    seasonal  . Asthma   . DEPRESSION   . DIABETES MELLITUS, TYPE II   . Diverticulosis   . HYPERLIPIDEMIA   . Macular degeneration of left eye    mild, Dr.Hecker  . Obesity, unspecified   . Osteoarthritis of both knees   . OSTEOPENIA   . Osteopenia   . URINARY INCONTINENCE      SURGICAL HISTORY: Past Surgical History:  Procedure Laterality Date  . CATARACT EXTRACTION Left 05/24/2018  . CESAREAN SECTION  01/1973  . CYSTOSCOPY/URETEROSCOPY/HOLMIUM LASER/STENT PLACEMENT Right 09/20/2019   Procedure: CYSTOSCOPY/URETEROSCOPY/HOLMIUM LASER/STENT PLACEMENT;  Surgeon: Lucas Mallow, MD;  Location: [facility];  Service: Urology;  Laterality: Right;  . FRACTURE SURGERY    . IR IMAGING GUIDED PORT INSERTION  02/20/2019  . left wrist surgery  2008   By Dr. Latanya Maudlin  . right ankle  1994    SOCIAL HISTORY: Social History   Socioeconomic History  . Marital status: Married    Spouse name: Not on file  . Number of children: 1  . Years of education: Not on file  . Highest education level: Not on file  Occupational History    Employer: [employer]  Tobacco Use  . Smoking status: Never Smoker  . Smokeless tobacco: Never Used  . Tobacco comment: Lives with partner [name]) and son  Vaping Use  . Vaping  Use: Never used  Substance and Sexual Activity  . Alcohol use: No    Alcohol/week: 0.0 standard drinks  . Drug use: No  . Sexual activity: Never    Partners: Female    Birth control/protection: Post-menopausal    Comment: Lives with female partner (annette hicks) and 45 yo son  Other Topics Concern  . Not on file  Social History Narrative  . Not on file   Social Determinants of Health   Financial Resource Strain:   . Difficulty of Paying Living Expenses:   Food Insecurity:   . Worried About Programme researcher, broadcasting/film/video in the Last Year:   . Barista in the Last Year:   Transportation Needs:   . Freight forwarder (Medical):   Marland Kitchen Lack of Transportation (Non-Medical):   Physical Activity:   . Days of Exercise per Week:   . Minutes of Exercise per Session:   Stress:   . Feeling of Stress :   Social Connections:   . Frequency of Communication with Friends and Family:   . Frequency of Social Gatherings with Friends  and Family:   . Attends Religious Services:   . Active Member of Clubs or Organizations:   . Attends Banker Meetings:   Marland Kitchen Marital Status:   Intimate Partner Violence:   . Fear of Current or Ex-Partner:   . Emotionally Abused:   Marland Kitchen Physically Abused:   . Sexually Abused:     FAMILY HISTORY: Family History  Problem Relation Age of Onset  . Diabetes Father   . Hyperlipidemia Father   . Heart disease Father   . Cancer Father   . Hypertension Father   . Colon cancer Paternal Grandmother 63  . Osteoporosis Mother   . Protein S deficiency Mother   . Hyperlipidemia Mother   . Multiple sclerosis Daughter   . Cancer Other        bladder  . Breast cancer Neg Hx     ALLERGIES:  is allergic to penicillins, aleve [naproxen sodium], and sulfonamide derivatives.  MEDICATIONS:  Current Outpatient Medications  Medication Sig Dispense Refill  . acyclovir (ZOVIRAX) 400 MG tablet TAKE 1 TABLET(400 MG) BY MOUTH TWICE DAILY 60 tablet 5  . apixaban (ELIQUIS) 5 MG TABS tablet Take 1 tablet (5 mg total) by mouth 2 (two) times daily. 60 tablet 0  . Blood Glucose Monitoring Suppl (FREESTYLE FREEDOM LITE) W/DEVICE KIT Use to check blood sugars twice a day Dx 250.00 1 each 0  . Calcium Carbonate-Vitamin D (CALCIUM 600+D HIGH POTENCY) 600-400 MG-UNIT per tablet Take 1 tablet by mouth 2 (two) times daily.     . Cetirizine HCl 10 MG CAPS Take 1 capsule (10 mg total) by mouth daily. 30 capsule 1  . dexamethasone (DECADRON) 4 MG tablet Take 5 tablets (20 mg total) by mouth once a week. On D22 of each cycle of treatment 20 tablet 5  . doxycycline (VIBRA-TABS) 100 MG tablet Take 1 tablet (100 mg total) by mouth 2 (two) times daily. 14 tablet 1  . fentaNYL (DURAGESIC) 12 MCG/HR Place 1 patch onto the skin every 3 (three) days. 10 patch 0  . fluticasone (FLONASE) 50 MCG/ACT nasal spray Place 1 spray into both nostrils daily. (Patient taking differently: Place 1 spray into both nostrils daily as  needed for allergies or rhinitis. ) 16 g 2  . glipiZIDE (GLUCOTROL XL) 5 MG 24 hr tablet TAKE 1 TABLET(5 MG) BY MOUTH DAILY WITH BREAKFAST 90 tablet  1  . glucose blood (FREESTYLE LITE) test strip CHECK BLOOD SUGAR TWICE DAILY AS DIRECTED Dx 250.00 180 each 3  . Lancets (FREESTYLE) lancets Use twice daily to check sugars. 100 each 11  . lenalidomide (REVLIMID) 15 MG capsule TAKE 1 CAPSULE BY MOUTH  DAILY FOR 21 DAYS ON, THEN  7 DAYS OFF 21 capsule 0  . lidocaine-prilocaine (EMLA) cream APPLY 1 APPLICATION TO THE AFFECTED AREA AS NEEDED. USE PRIOR TO PORT ACCESS 30 g 0  . LORazepam (ATIVAN) 0.5 MG tablet Take 1 tablet (0.5 mg total) by mouth every 8 (eight) hours as needed for anxiety (significant essential tremors). 60 tablet 0  . metFORMIN (GLUCOPHAGE-XR) 500 MG 24 hr tablet TAKE 3 TABLETS(1500 MG) BY MOUTH DAILY WITH BREAKFAST 270 tablet 1  . methylPREDNISolone (MEDROL DOSEPAK) 4 MG TBPK tablet As directed 21 tablet 0  . Multiple Vitamins-Minerals (ICAPS) CAPS Take 1 capsule by mouth daily after breakfast.     . ondansetron (ZOFRAN) 8 MG tablet Take 1 tablet (8 mg total) by mouth 2 (two) times daily as needed (Nausea or vomiting). 30 tablet 1  . Oxycodone HCl 10 MG TABS Take 1 tablet (10 mg total) by mouth every 6 (six) hours as needed. 90 tablet 0  . pantoprazole (PROTONIX) 20 MG tablet TAKE 1 TABLET(20 MG) BY MOUTH DAILY 30 tablet 5  . polyethylene glycol (MIRALAX / GLYCOLAX) packet Take 17 g by mouth daily after breakfast.     . potassium chloride SA (KLOR-CON) 20 MEQ tablet TAKE 1 TABLET(20 MEQ) BY MOUTH TWICE DAILY 60 tablet 1  . prochlorperazine (COMPAZINE) 10 MG tablet Take 1 tablet (10 mg total) by mouth every 6 (six) hours as needed (Nausea or vomiting). 30 tablet 1  . senna-docusate (SENNA S) 8.6-50 MG tablet Take 2 tablets by mouth at bedtime. 60 tablet 2  . sertraline (ZOLOFT) 50 MG tablet TAKE 1 TABLET BY MOUTH DAILY 30 tablet 2  . simvastatin (ZOCOR) 20 MG tablet TAKE 1 TABLET BY  MOUTH EVERY DAY AT 6 PM 30 tablet 2  . Triamcinolone Acetonide 0.025 % LOTN Apply 1 application topically 3 (three) times daily as needed (rash/itching). 60 mL 2  . triamcinolone cream (KENALOG) 0.5 % Apply 1 application topically 4 (four) times daily. 90 g 1  . Vitamin D, Ergocalciferol, (DRISDOL) 1.25 MG (50000 UNIT) CAPS capsule TAKE 1 CAPSULE BY MOUTH EVERY 7 DAYS 12 capsule 0   No current facility-administered medications for this visit.   Facility-Administered Medications Ordered in Other Visits  Medication Dose Route Frequency Provider Last Rate Last Admin  . 0.9 %  sodium chloride infusion   Intravenous Once Johney Maine, MD      . heparin lock flush 100 unit/mL  500 Units Intracatheter Once PRN Johney Maine, MD      . heparin lock flush 100 unit/mL  500 Units Intracatheter Once PRN Johney Maine, MD      . sodium chloride flush (NS) 0.9 % injection 10 mL  10 mL Intracatheter PRN Johney Maine, MD   10 mL at 10/02/19 1524  . sodium chloride flush (NS) 0.9 % injection 10 mL  10 mL Intracatheter PRN Johney Maine, MD        REVIEW OF SYSTEMS:   A 10+ POINT REVIEW OF SYSTEMS WAS OBTAINED including neurology, dermatology, psychiatry, cardiac, respiratory, lymph, extremities, GI, GU, Musculoskeletal, constitutional, breasts, reproductive, HEENT.  All pertinent positives are noted in the HPI.  All others are negative.  PHYSICAL EXAMINATION: ECOG FS:2 - Symptomatic, <50% confined to bed  Vitals:   07/16/20 0913  BP: 125/67  Pulse: 73  Resp: 17  Temp: (!) 97.3 F (36.3 C)  SpO2: 94%   Wt Readings from Last 3 Encounters:  07/16/20 159 lb 9.6 oz (72.4 kg)  05/21/20 161 lb (73 kg)  04/23/20 162 lb 11.2 oz (73.8 kg)   Body mass index is 32.24 kg/m.    Exam was given in a chair   GENERAL:alert, in no acute distress and comfortable SKIN: no acute rashes, no significant lesions EYES: conjunctiva are pink and non-injected, sclera  anicteric OROPHARYNX: MMM, no exudates, no oropharyngeal erythema or ulceration NECK: supple, no JVD LYMPH:  no palpable lymphadenopathy in the cervical, axillary or inguinal regions LUNGS: clear to auscultation b/l with normal respiratory effort HEART: regular rate & rhythm ABDOMEN:  normoactive bowel sounds , non tender, not distended. No palpable hepatosplenomegaly.  Extremity: no pedal edema PSYCH: alert & oriented x 3 with fluent speech NEURO: no focal motor/sensory deficits  LABORATORY DATA:  I have reviewed the data as listed  . CBC Latest Ref Rng & Units 07/16/2020 06/18/2020 06/04/2020  WBC 4.0 - 10.5 K/uL 3.4(L) 4.7 6.1  Hemoglobin 12.0 - 15.0 g/dL 12.6 12.4 12.5  Hematocrit 36 - 46 % 37.6 37.1 37.2  Platelets 150 - 400 K/uL 177 186 157   . CBC    Component Value Date/Time   WBC 3.4 (L) 07/16/2020 0902   RBC 3.75 (L) 07/16/2020 0902   HGB 12.6 07/16/2020 0902   HGB 12.8 02/16/2019 1138   HCT 37.6 07/16/2020 0902   PLT 177 07/16/2020 0902   PLT 170 02/16/2019 1138   MCV 100.3 (H) 07/16/2020 0902   MCH 33.6 07/16/2020 0902   MCHC 33.5 07/16/2020 0902   RDW 13.4 07/16/2020 0902   LYMPHSABS 1.2 07/16/2020 0902   MONOABS 0.7 07/16/2020 0902   EOSABS 0.1 07/16/2020 0902   BASOSABS 0.0 07/16/2020 0902    . CMP Latest Ref Rng & Units 07/16/2020 06/18/2020 06/04/2020  Glucose 70 - 99 mg/dL 207(H) 171(H) 222(H)  BUN 8 - 23 mg/dL _0 Creatinine 0.44 - 1.00 mg/dL 1.03(H) 1.01(H) 1.25(H)  Sodium 135 - 145 mmol/L 140 140 139  Potassium 3.5 - 5.1 mmol/L 3.9 3.6 3.5  Chloride 98 - 111 mmol/L 106 106 108  CO2 22 - 32 mmol/L 24 22 19(L)  Calcium 8.9 - 10.3 mg/dL 10.6(H) 10.2 9.4  Total Protein 6.5 - 8.1 g/dL 6.7 6.3(L) 6.0(L)  Total Bilirubin 0.3 - 1.2 mg/dL 0.4 0.7 0.6  Alkaline Phos 38 - 126 U/L 72 71 76  AST 15 - 41 U/L 13(L) 14(L) 12(L)  ALT 0 - 44 U/L _1 09/18/2019 BM Bx Report ([id number])   09/18/2019 FISH Panel    05/30/2019 BM Bx   01/06/2019  BM Bx:     01/06/19 Cytogenetics:      05/30/19 BM Biopsy:   09/18/2019 FISH Panel    09/18/2019 BM Surgical Pathology ([id number])     RADIOGRAPHIC STUDIES: I have personally reviewed the radiological images as listed and agreed with the findings in the report. No results found.  ASSESSMENT & PLAN:   79 y.o. female with  1. Recently diagnosed Multiple Myeloma, RISS Stage III  Labs upon initial presentation from 12/08/18, blood counts are normal including WBC at 7.1k, HGB at 13.1, and PLT at 245k. Calcium normal at 10.3. Creatinine normal at 0.63. M spike  at 0.5g. 12/13/18 Bone Scan revealed Multifocal uptake throughout the skeleton, consistent with diffuse metastatic disease. Primary tumor is not specified. 2. Uptake in the proximal right femur, consistent with lytic lesions. 3. Uptake in the ribs bilaterally as described. 4. Lesions in the proximal left humerus. 5. Diffuse uptake throughout the skull consistent with metastatic disease. 6. Right paramedian uptake at the manubrium.  12/13/18 CT Right Femur revealed Numerous lytic lesions involving the right femur and a lytic lesion in the left inferior pubic ramus. Overall appearance is most concerning for multiple myeloma  12/27/18 Pretreatment 24hour UPEP observed an M spike at '18mg'$ , and showed '199mg'$  total protein/day.  12/27/18 Pretreatment MMP revealed M Protein at 0.5g with IgG Lambda specificity. Kappa:Lambda light chain ratio at 0.13, with Lambda at 40.3. There is less abnormal protein and light chains than I would expect from 30% plasma cells, which suggests hypo-secretory or non-secretory neoplastic plasma cells. Will have an impact in assessing response. 01/05/19 PET/CT revealed Innumerable lytic lesions in the skeleton compatible with myeloma. Most of the larger lesions are hypermetabolic, for example including a left proximal humeral shaft lesion with maximum SUV of 8.1 and a 2.8 cm lesion in the left T9 vertebral  body with maximum SUV 5.1. Most of the smaller lytic lesions, and some of the larger lesions, do not demonstrate accentuated metabolic activity. 2. 1.2 cm in short axis lymph node in the left parapharyngeal space is hypermetabolic with maximum SUV 11.8. I do not see a separate mass in the head and neck to give rise to this hypermetabolic lymph node. 3. Mosaic attenuation in the lower lobes, nonspecific possibly from air trapping. 4.  Aortic Atherosclerosis 5. Heterogeneous activity in the liver, making it hard to exclude small liver lesions. Consider hepatic protocol MRI with and without contrast for definitive assessment. Nonobstructive right nephrolithiasis. Old granulomatous disease  01/06/19 Bone Marrow biopsy revealed interstitial increase in plasma cells (28% aspirate, 40% CD138 immunohistochemistry). Plasma cells negative for light chains consistent with a non or weakly secretory myeloma   01/06/19 Cytogenetics revealed 37% of cells with trisomy 11 or 11q deletion, and 40.5% of cells with 17p mutation  S/p 5 cycles of KRD treatment  05/31/19 BM Biopsy revealed mild atypical plasmacytosis at 5% with polytypic variation.   06/01/19 PET/CT revealed "Dominant lesion in the LEFT humerus is decreased significantly in metabolic activity. Additional hypermetabolic skeletal lytic lesions have decreased in metabolic activity or similar to comparison exam (01/05/2019). No evidence of disease progression. 2. Multiple additional lytic lesions do not have metabolic activity and unchanged. 3. No new skeletal lesions are identified. No soft tissue plasmacytoma identified. 4. Nodule / node in the LEFT parapharyngeal space which is intensely hypermetabolic not changed from prior. 5. New hypermetabolic LEFT lower lobe pulmonary nodule is indeterminate. Recommend close attention on follow-up 6. New obstructive hydronephrosis of the RIGHT kidney related to RIGHT UPJ stone."  09/18/2019 BM Bx Report which revealed  "Slightly hypercellular bone marrow for age with trilineage hematopoiesis and 1% plasma cells."  09/14/2019 PET/CT Whole Body Scan ([id number]) which revealed "1. There widespread tiny lytic lesions compatible with multiple myeloma. Index larger lesions are generally similar to the prior exam, with low-grade activity such as the left T9 vertebral body lesion with maximum SUV 4.5. Is mild increase in the activity associated with a mildly sclerotic left proximal humeral lesion, maximum SUV 4.8 (previously 3.5). 2. At the site of the prior left lower lobe nodule is currently more bandlike thickening, with maximum SUV  only 1.9, probably benign, continued surveillance of this region suggested. 3. There several small but hypermetabolic lymph nodes. This includes a left parapharyngeal space node measuring 1.0 cm with maximum SUV 12.3 (stable); a left level IB lymph node measuring 0.5 cm with maximum SUV 4.8 (slightly larger than prior); and a left inguinal lymph node measuring 0.7 cm in short axis with maximum SUV 6.4 (previously 0.5 cm with maximum SUV 0.6). Significance of these lymph nodes uncertain, surveillance is recommended. 4. New 5 mm left lower lobe subpleural nodule on image 32/8, not appreciably hypermetabolic, surveillance suggested. 5. Focal subcutaneous stranding along the left perineum measuring about 2.6 by 1.1 cm on image 221/4, maximum SUV 12.5. This was not present previously and is most likely inflammatory, although given the notable SUV, surveillance of this region is suggested. 6. Other imaging findings of potential clinical significance: Aortic Atherosclerosis (ICD10-I70.0). Coronary atherosclerosis. Old granulomatous disease. Mild right hydronephrosis due to a 7 mm right UPJ calculus. 2 mm right kidney upper pole nonobstructive renal calculus. Prominent stool throughout the colon favors constipation."  12/19/2019 Thoracic & Lumbar Spine MRI ([id number]) ([id number]) revealed "Suspected myeloma  lesions at T9 and S1. No compression deformity or epidural disease.  01/18/2020 PET/CT ([id number]) which revealed "1. Stable lytic lesions throughout the skeleton. The larger lytic lesions which had mild metabolic activity on comparison exam now have background metabolic activity. No evidence of active myeloma. No evidence of progression multiple myeloma.  No plasmacytoma 3. Hypermetabolic nodules in the LEFT neck may be associated deep tissues of the LEFT parotid gland. 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  01/17/19 MRI Liver revealed Several appreciable liver lesions all have benign imaging characteristics. No MRI findings of metastatic involvement of the liver. 2. Scattered bony lesions corresponding to the lytic lesions seen at PET-CT, compatible with active myeloma. 3. Aortic Atherosclerosis.  Mild cardiomegaly. 4. Diffuse hepatic steatosis.   3. Left lower lobe pulmonary nodule First seen on 06/01/19 PET/CT  PLAN: -Discussed pt labwork today, 07/16/20; blood counts look good, blood chemistries are steady, D-dimer is elevated. -Discussed 07//05/2020 MMP which showed no M Protein, K/L light chains which are all WNL -The pt shows no lab or clinical evidence of Multiple Myeloma progression at this time  -The pt has no prohibitive toxicities from continuing C19D1 Carfilzomib at this time.  -Will continue Carflizomib at 56 mg/m^2q 2weeks for maintenance. Fatigue is well-controlled at this dose.  -Recommend CT Chest to r/o PE. Would like to be more proactive due to pt's non-secretory Multiple Myeloma.  -Will start on 5 mg Eliquis BID. Advised pt to discontiue Aspirin at this time.  -Advised pt hold Revlimid at this time.  -Rx Eliquis  -Will get CT Chest tomorrow -Will see back in 2-3 days via  phone -Recommend pt go to ED immediately for any new SOB or chest pain.    FOLLOW UP: -Will get CT Chest tomorrow.  -Phone visit with Dr. Irene Limbo in 2-3 days.   The total time spent in the appt was 30 minutes and more than 50% was on counseling and direct patient cares.  All of the patient's questions were answered with apparent satisfaction. The patient knows to call the clinic with any problems, questions or concerns.    Sullivan Lone MD [place] AAHIVMS [facility] Hematology/Oncology Physician [place]  (Office):  [phone] (Work cell):  [phone] (Fax):           [phone]  07/16/2020 4:54 PM  I, Yevette Edwards, am acting as a scribe for Dr. Sullivan Lone.   .I have reviewed the above documentation for accuracy and completeness, and I agree with the above. Brunetta Genera MD

## 2020-07-16 NOTE — Telephone Encounter (Signed)
Per Dr. Irene Limbo - contacted patient to inform that D-dimer is elevated,so will order CTA as discussed. She should hold Revlimid at this time. Patient verbalized understanding of information and med directions.

## 2020-07-16 NOTE — Patient Instructions (Signed)
[facility] Discharge Instructions for Patients Receiving Chemotherapy  Today you received the following chemotherapy agents:  Kyprolis  To help prevent nausea and vomiting after your treatment, we encourage you to take your nausea medication as directed.   If you develop nausea and vomiting that is not controlled by your nausea medication, call the clinic.   BELOW ARE SYMPTOMS THAT SHOULD BE REPORTED IMMEDIATELY:  *FEVER GREATER THAN 100.5 F  *CHILLS WITH OR WITHOUT FEVER  NAUSEA AND VOMITING THAT IS NOT CONTROLLED WITH YOUR NAUSEA MEDICATION  *UNUSUAL SHORTNESS OF BREATH  *UNUSUAL BRUISING OR BLEEDING  TENDERNESS IN MOUTH AND THROAT WITH OR WITHOUT PRESENCE OF ULCERS  *URINARY PROBLEMS  *BOWEL PROBLEMS  UNUSUAL RASH Items with * indicate a potential emergency and should be followed up as soon as possible.  Feel free to call the clinic should you have any questions or concerns. The clinic phone number is [phone].  Please show the CHEMO ALERT CARD at check-in to the Emergency Department and triage nurse.   

## 2020-07-18 ENCOUNTER — Other Ambulatory Visit: Payer: Self-pay

## 2020-07-18 ENCOUNTER — Ambulatory Visit (HOSPITAL_COMMUNITY)
Admission: RE | Admit: 2020-07-18 | Discharge: 2020-07-18 | Disposition: A | Discharge status: Home / Self Care | Payer: Medicare PPO | Source: Ambulatory Visit | Attending: Hematology | Admitting: Hematology

## 2020-07-18 DIAGNOSIS — R0789 Other chest pain: Secondary | ICD-10-CM | POA: Insufficient documentation

## 2020-07-18 DIAGNOSIS — I700 Atherosclerosis of aorta: Secondary | ICD-10-CM | POA: Diagnosis not present

## 2020-07-18 DIAGNOSIS — S2243XA Multiple fractures of ribs, bilateral, initial encounter for closed fracture: Secondary | ICD-10-CM | POA: Diagnosis not present

## 2020-07-18 DIAGNOSIS — J984 Other disorders of lung: Secondary | ICD-10-CM | POA: Diagnosis not present

## 2020-07-18 DIAGNOSIS — I2510 Atherosclerotic heart disease of native coronary artery without angina pectoris: Secondary | ICD-10-CM | POA: Diagnosis not present

## 2020-07-18 MED ORDER — IOHEXOL 350 MG/ML SOLN
75.0000 mL | Freq: Once | INTRAVENOUS | Status: AC | PRN
  Administered 2020-07-18: 75 mL via INTRAVENOUS

## 2020-07-23 ENCOUNTER — Telehealth: Payer: Self-pay

## 2020-07-23 ENCOUNTER — Encounter: Payer: Self-pay | Admitting: Hematology

## 2020-07-23 ENCOUNTER — Telehealth: Payer: Self-pay | Admitting: Hematology

## 2020-07-23 NOTE — Telephone Encounter (Signed)
Contacted patient to verify telephone visit for pre reg °

## 2020-07-23 NOTE — Telephone Encounter (Signed)
TC to pt in regard to her question about tomorrow's visit. I let her know that yes her spouse can be on the phone during doctor visit. Pt verbalized understanding.

## 2020-07-23 NOTE — Telephone Encounter (Signed)
Scheduled per 08/03 los, patient has been called and notified. 

## 2020-07-24 ENCOUNTER — Inpatient Hospital Stay (HOSPITAL_BASED_OUTPATIENT_CLINIC_OR_DEPARTMENT_OTHER): Payer: Medicare PPO | Admitting: Hematology

## 2020-07-24 DIAGNOSIS — C7951 Secondary malignant neoplasm of bone: Secondary | ICD-10-CM

## 2020-07-24 DIAGNOSIS — M549 Dorsalgia, unspecified: Secondary | ICD-10-CM | POA: Diagnosis not present

## 2020-07-24 DIAGNOSIS — R0789 Other chest pain: Secondary | ICD-10-CM | POA: Diagnosis not present

## 2020-07-24 DIAGNOSIS — R911 Solitary pulmonary nodule: Secondary | ICD-10-CM | POA: Diagnosis not present

## 2020-07-24 DIAGNOSIS — Z5111 Encounter for antineoplastic chemotherapy: Secondary | ICD-10-CM | POA: Diagnosis not present

## 2020-07-24 DIAGNOSIS — Z79899 Other long term (current) drug therapy: Secondary | ICD-10-CM | POA: Diagnosis not present

## 2020-07-24 DIAGNOSIS — C9000 Multiple myeloma not having achieved remission: Secondary | ICD-10-CM

## 2020-07-24 DIAGNOSIS — Z5112 Encounter for antineoplastic immunotherapy: Secondary | ICD-10-CM | POA: Diagnosis not present

## 2020-07-24 DIAGNOSIS — Z800 Family history of malignant neoplasm of digestive organs: Secondary | ICD-10-CM | POA: Diagnosis not present

## 2020-07-24 NOTE — Progress Notes (Signed)
HEMATOLOGY/ONCOLOGY CLINIC NOTE  Date of Service: 07/24/2020  Patient Care Team: Hoyt Koch, MD as PCP - General (Internal Medicine) Lafayette Dragon, MD (Inactive) as Consulting Physician (Gastroenterology) Megan Salon, MD as Consulting Physician (Gynecology) Melrose Nakayama, MD as Consulting Physician (Orthopedic Surgery) Deneise Lever, MD as Consulting Physician (Pulmonary Disease) [name], MD (Ophthalmology)  CHIEF COMPLAINTS/PURPOSE OF CONSULTATION:  Continue mx of myeloma  HISTORY OF PRESENTING ILLNESS:   Faith Orr is a wonderful 79 y.o. female who has been referred to [name] by Dr. Pricilla Holm for evaluation and management of Lytic bone lesions. She is accompanied today by her son in law, and her partner is present via phone. The pt reports that she is doing well overall.   The pt notes that 2-3 months ago while standing at the stove, and otherwise feeling normally, she felt "something snap that took her breath away" in her mid back as she stretched to get something. The pt notes that she saw a chiropractor twice due to her back stiffness, which did not help. The pt then described her new bone pains to her PCP on 12/05/18, and subsequent imaging, as noted below, revealed concerns for numerous bone lesions. She has begun 26m Fosamax. The pt notes that she had hip pain in 2018, and that an XR at that time did not reveal any lesions.  The pt notes that most of her pain is concentrated to her left shoulder presently, and with minimal movement of the arm. She endorses pain radiating into her left arm and notes that her hand is swollen in the mornings when she wakes up. She also endorses present back pain and right hip pain, worse when she walks. She has not yet seen orthopedics. The pt reports that she is needing to take 6029mAdvil every 4-6 hours as Tramadol alone has not been able to alleviate her pain. The pt denies any unexpected weight loss, fevers,  chills, or night sweats. The pt notes that her urine is a very dark color presently, but denies overt blood in the urine, underpants, nor tissue paper. The pt notes that in the last two weeks she has had some soreness in her head, but denies new headaches or changes in vision. The pt notes that she has been urinating more frequently overall, but has been trying to stay better hydrated as well.  The pt notes that she has been compliant with annual mammograms.   The pt notes that she had a cyst in her right breast which was removed in the past. She fractured her left wrist in 2008 after falling down stairs. The pt endorses history of fatty liver.   The pt denies ever smoking cigarettes and endorses significant second hand smoke exposure with a previous marriage.   Of note prior to the patient's visit today, pt has had a Bone Scan completed on 12/13/18 with results revealing Multifocal uptake throughout the skeleton, consistent with diffuse metastatic disease. Primary tumor is not specified. 2. Uptake in the proximal right femur, consistent with lytic lesions. 3. Uptake in the ribs bilaterally as described. 4. Lesions in the proximal left humerus. 5. Diffuse uptake throughout the skull consistent with metastatic disease. 6. Right paramedian uptake at the manubrium.  Most recent lab results (12/08/18) of CBC w/diff and CMP is as follows: all values are WNL except for Glucose at 279, BUN at 24, AST at 41, ALT at 46. 12/08/18 SPEP revealed all values WNL except for Total  Protein at 6.0, Albumin at 3.6, Gamma globulin at 0.7, and M spike at 0.5g  On review of systems, pt reports significant left shoulder pain, back pain, right hip pain, dark urine, and denies fevers, chills, night sweats, unexpected weight loss, changes in bowel habits, changes in breathing, cough, new respiratory symptoms, changes in vision, abdominal pains, leg swelling, and any other symptoms.   On PMHx the pt reports fatty liver, and  denies blood clots.  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 Denies ever smoking.  On Family Hx the pt reports maternal grandmother with colon cancer. Father with bladder cancer and amyloidosis (pt notes that this could have been misdiagnosis). Mother with Protein S deficiency and polymyalgia rheumatica.  Current Treatment: Carfilzomib + Revlimid maintenance.  Interval History:  I connected with  Gradie Butrick on 07/24/20 by telephone and verified that I am speaking with the correct person using two identifiers.   I discussed the limitations of evaluation and management by telemedicine. The patient expressed understanding and agreed to proceed.  Other persons participating in the visit and their role in the encounter:      -Yevette Edwards, [place], Pt's wife  Patient's location: Home  Provider's location: [facility] [place] returns today for management and evaluation of her Multiple Myeloma. The patient's last visit with [name] was on 07/16/2020. The pt reports that she is doing well overall.  The pt reports that she is still experiencing chest wall pain when she moves quickly and in specific patterns. She felt partial relief from this discomfort 2-3 days after beginning Eliquis.   Of note since the patient's last visit, pt has had CT Angio Chest ([id number]) completed on 07/18/2020 with results revealing "1. No pulmonary embolus. 2. Heterogeneous pulmonary parenchyma with areas of ground-glass, favoring small airways or small vessel disease. 3. Anterior left upper lobe opacity appears elongated on axial images but more nodular on coronal reformats. Recommend attention on follow-up. 4. Mildly enlarged right hilar node is likely reactive. 5. Lucent lesion in T9 vertebra consistent with myelomatous lesion is seen on thoracic spine MRI 12/19/2019. Multiple additional tiny lucent lesions throughout bilateral  ribs and sternum, likely also related to multiple myeloma. No acute or pathologic fracture."  On review of systems, pt reports chest wall pain and denies any other symptoms.   MEDICAL HISTORY:  Past Medical History:  Diagnosis Date  . Allergy    seasonal  . Asthma   . DEPRESSION   . DIABETES MELLITUS, TYPE II   . Diverticulosis   . HYPERLIPIDEMIA   . Macular degeneration of left eye    mild, Dr.Hecker  . Obesity, unspecified   . Osteoarthritis of both knees   . OSTEOPENIA   . Osteopenia   . URINARY INCONTINENCE     SURGICAL HISTORY: Past Surgical History:  Procedure Laterality Date  . CATARACT EXTRACTION Left 05/24/2018  . CESAREAN SECTION  01/1973  . CYSTOSCOPY/URETEROSCOPY/HOLMIUM LASER/STENT PLACEMENT Right 09/20/2019   Procedure: CYSTOSCOPY/URETEROSCOPY/HOLMIUM LASER/STENT PLACEMENT;  Surgeon: Lucas Mallow, MD;  Location: [facility];  Service: Urology;  Laterality: Right;  . FRACTURE SURGERY    . IR IMAGING GUIDED PORT INSERTION  02/20/2019  . left wrist surgery  2008   By Dr. Latanya Maudlin  . right ankle  1994    SOCIAL HISTORY: Social History   Socioeconomic History  . Marital status: Married  Spouse name: Not on file  . Number of children: 1  . Years of education: Not on file  . Highest education level: Not on file  Occupational History    Employer: [employer]  Tobacco Use  . Smoking status: Never Smoker  . Smokeless tobacco: Never Used  . Tobacco comment: Lives with partner [name]) and son  Vaping Use  . Vaping Use: Never used  Substance and Sexual Activity  . Alcohol use: No    Alcohol/week: 0.0 standard drinks  . Drug use: No  . Sexual activity: Never    Partners: Female    Birth control/protection: Post-menopausal    Comment: Lives with female partner (annette hicks) and 11 yo son  Other Topics Concern  . Not on file  Social History Narrative  . Not on file   Social Determinants of Health   Financial  Resource Strain:   . Difficulty of Paying Living Expenses:   Food Insecurity:   . Worried About Charity fundraiser in the Last Year:   . Arboriculturist in the Last Year:   Transportation Needs:   . Film/video editor (Medical):   Marland Kitchen Lack of Transportation (Non-Medical):   Physical Activity:   . Days of Exercise per Week:   . Minutes of Exercise per Session:   Stress:   . Feeling of Stress :   Social Connections:   . Frequency of Communication with Friends and Family:   . Frequency of Social Gatherings with Friends and Family:   . Attends Religious Services:   . Active Member of Clubs or Organizations:   . Attends Archivist Meetings:   Marland Kitchen Marital Status:   Intimate Partner Violence:   . Fear of Current or Ex-Partner:   . Emotionally Abused:   Marland Kitchen Physically Abused:   . Sexually Abused:     FAMILY HISTORY: Family History  Problem Relation Age of Onset  . Diabetes Father   . Hyperlipidemia Father   . Heart disease Father   . Cancer Father   . Hypertension Father   . Colon cancer Paternal Grandmother 36  . Osteoporosis Mother   . Protein S deficiency Mother   . Hyperlipidemia Mother   . Multiple sclerosis Daughter   . Cancer Other        bladder  . Breast cancer Neg Hx     ALLERGIES:  is allergic to penicillins, aleve [naproxen sodium], and sulfonamide derivatives.  MEDICATIONS:  Current Outpatient Medications  Medication Sig Dispense Refill  . acyclovir (ZOVIRAX) 400 MG tablet TAKE 1 TABLET(400 MG) BY MOUTH TWICE DAILY 60 tablet 5  . apixaban (ELIQUIS) 5 MG TABS tablet Take 1 tablet (5 mg total) by mouth 2 (two) times daily. 60 tablet 0  . Blood Glucose Monitoring Suppl (FREESTYLE FREEDOM LITE) W/DEVICE KIT Use to check blood sugars twice a day Dx 250.00 1 each 0  . Calcium Carbonate-Vitamin D (CALCIUM 600+D HIGH POTENCY) 600-400 MG-UNIT per tablet Take 1 tablet by mouth 2 (two) times daily.     . Cetirizine HCl 10 MG CAPS Take 1 capsule (10 mg total)  by mouth daily. 30 capsule 1  . dexamethasone (DECADRON) 4 MG tablet Take 5 tablets (20 mg total) by mouth once a week. On D22 of each cycle of treatment 20 tablet 5  . doxycycline (VIBRA-TABS) 100 MG tablet Take 1 tablet (100 mg total) by mouth 2 (two) times daily. 14 tablet 1  . fentaNYL (DURAGESIC) 12 MCG/HR  Place 1 patch onto the skin every 3 (three) days. 10 patch 0  . fluticasone (FLONASE) 50 MCG/ACT nasal spray Place 1 spray into both nostrils daily. (Patient taking differently: Place 1 spray into both nostrils daily as needed for allergies or rhinitis. ) 16 g 2  . glipiZIDE (GLUCOTROL XL) 5 MG 24 hr tablet TAKE 1 TABLET(5 MG) BY MOUTH DAILY WITH BREAKFAST 90 tablet 1  . glucose blood (FREESTYLE LITE) test strip CHECK BLOOD SUGAR TWICE DAILY AS DIRECTED Dx 250.00 180 each 3  . Lancets (FREESTYLE) lancets Use twice daily to check sugars. 100 each 11  . lenalidomide (REVLIMID) 15 MG capsule TAKE 1 CAPSULE BY MOUTH  DAILY FOR 21 DAYS ON, THEN  7 DAYS OFF 21 capsule 0  . lidocaine-prilocaine (EMLA) cream APPLY 1 APPLICATION TO THE AFFECTED AREA AS NEEDED. USE PRIOR TO PORT ACCESS 30 g 0  . LORazepam (ATIVAN) 0.5 MG tablet Take 1 tablet (0.5 mg total) by mouth every 8 (eight) hours as needed for anxiety (significant essential tremors). 60 tablet 0  . metFORMIN (GLUCOPHAGE-XR) 500 MG 24 hr tablet TAKE 3 TABLETS(1500 MG) BY MOUTH DAILY WITH BREAKFAST 270 tablet 1  . methylPREDNISolone (MEDROL DOSEPAK) 4 MG TBPK tablet As directed 21 tablet 0  . Multiple Vitamins-Minerals (ICAPS) CAPS Take 1 capsule by mouth daily after breakfast.     . ondansetron (ZOFRAN) 8 MG tablet Take 1 tablet (8 mg total) by mouth 2 (two) times daily as needed (Nausea or vomiting). 30 tablet 1  . Oxycodone HCl 10 MG TABS Take 1 tablet (10 mg total) by mouth every 6 (six) hours as needed. 90 tablet 0  . pantoprazole (PROTONIX) 20 MG tablet TAKE 1 TABLET(20 MG) BY MOUTH DAILY 30 tablet 5  . polyethylene glycol (MIRALAX /  GLYCOLAX) packet Take 17 g by mouth daily after breakfast.     . potassium chloride SA (KLOR-CON) 20 MEQ tablet TAKE 1 TABLET(20 MEQ) BY MOUTH TWICE DAILY 60 tablet 1  . prochlorperazine (COMPAZINE) 10 MG tablet Take 1 tablet (10 mg total) by mouth every 6 (six) hours as needed (Nausea or vomiting). 30 tablet 1  . senna-docusate (SENNA S) 8.6-50 MG tablet Take 2 tablets by mouth at bedtime. 60 tablet 2  . sertraline (ZOLOFT) 50 MG tablet TAKE 1 TABLET BY MOUTH DAILY 30 tablet 2  . simvastatin (ZOCOR) 20 MG tablet TAKE 1 TABLET BY MOUTH EVERY DAY AT 6 PM 30 tablet 2  . Triamcinolone Acetonide 0.025 % LOTN Apply 1 application topically 3 (three) times daily as needed (rash/itching). 60 mL 2  . triamcinolone cream (KENALOG) 0.5 % Apply 1 application topically 4 (four) times daily. 90 g 1  . Vitamin D, Ergocalciferol, (DRISDOL) 1.25 MG (50000 UNIT) CAPS capsule TAKE 1 CAPSULE BY MOUTH EVERY 7 DAYS 12 capsule 0   No current facility-administered medications for this visit.   Facility-Administered Medications Ordered in Other Visits  Medication Dose Route Frequency Provider Last Rate Last Admin  . heparin lock flush 100 unit/mL  500 Units Intracatheter Once PRN Brunetta Genera, MD      . sodium chloride flush (NS) 0.9 % injection 10 mL  10 mL Intracatheter PRN Brunetta Genera, MD   10 mL at 10/02/19 1524    REVIEW OF SYSTEMS:   A 10+ POINT REVIEW OF SYSTEMS WAS OBTAINED including neurology, dermatology, psychiatry, cardiac, respiratory, lymph, extremities, GI, GU, Musculoskeletal, constitutional, breasts, reproductive, HEENT.  All pertinent positives are noted in the HPI.  All others are negative.   PHYSICAL EXAMINATION: ECOG FS:2 - Symptomatic, <50% confined to bed  There were no vitals filed for this visit. Wt Readings from Last 3 Encounters:  07/16/20 159 lb 9.6 oz (72.4 kg)  05/21/20 161 lb (73 kg)  04/23/20 162 lb 11.2 oz (73.8 kg)   There is no height or weight on file to  calculate BMI.    Telehealth visit  LABORATORY DATA:  I have reviewed the data as listed  . CBC Latest Ref Rng & Units 07/16/2020 06/18/2020 06/04/2020  WBC 4.0 - 10.5 K/uL 3.4(L) 4.7 6.1  Hemoglobin 12.0 - 15.0 g/dL 12.6 12.4 12.5  Hematocrit 36 - 46 % 37.6 37.1 37.2  Platelets 150 - 400 K/uL 177 186 157   . CBC    Component Value Date/Time   WBC 3.4 (L) 07/16/2020 0902   RBC 3.75 (L) 07/16/2020 0902   HGB 12.6 07/16/2020 0902   HGB 12.8 02/16/2019 1138   HCT 37.6 07/16/2020 0902   PLT 177 07/16/2020 0902   PLT 170 02/16/2019 1138   MCV 100.3 (H) 07/16/2020 0902   MCH 33.6 07/16/2020 0902   MCHC 33.5 07/16/2020 0902   RDW 13.4 07/16/2020 0902   LYMPHSABS 1.2 07/16/2020 0902   MONOABS 0.7 07/16/2020 0902   EOSABS 0.1 07/16/2020 0902   BASOSABS 0.0 07/16/2020 0902    . CMP Latest Ref Rng & Units 07/16/2020 06/18/2020 06/04/2020  Glucose 70 - 99 mg/dL 207(H) 171(H) 222(H)  BUN 8 - 23 mg/dL '18 14 13  ' Creatinine 0.44 - 1.00 mg/dL 1.03(H) 1.01(H) 1.25(H)  Sodium 135 - 145 mmol/L 140 140 139  Potassium 3.5 - 5.1 mmol/L 3.9 3.6 3.5  Chloride 98 - 111 mmol/L 106 106 108  CO2 22 - 32 mmol/L 24 22 19(L)  Calcium 8.9 - 10.3 mg/dL 10.6(H) 10.2 9.4  Total Protein 6.5 - 8.1 g/dL 6.7 6.3(L) 6.0(L)  Total Bilirubin 0.3 - 1.2 mg/dL 0.4 0.7 0.6  Alkaline Phos 38 - 126 U/L 72 71 76  AST 15 - 41 U/L 13(L) 14(L) 12(L)  ALT 0 - 44 U/L '12 17 19   ' 09/18/2019 BM Bx Report ([id number])   09/18/2019 FISH Panel    05/30/2019 BM Bx   01/06/2019 BM Bx:     01/06/19 Cytogenetics:      05/30/19 BM Biopsy:   09/18/2019 FISH Panel    09/18/2019 BM Surgical Pathology ([id number])     RADIOGRAPHIC STUDIES: I have personally reviewed the radiological images as listed and agreed with the findings in the report. CT ANGIO CHEST PE W OR WO CONTRAST  Result Date: 07/18/2020 CLINICAL DATA:  PE suspected, low/intermediate prob, positive D-dimer Patient with myeloma on Revlimid with  left posterior chest pain with elevated d dimer EXAM: CT ANGIOGRAPHY CHEST WITH CONTRAST TECHNIQUE: Multidetector CT imaging of the chest was performed using the standard protocol during bolus administration of intravenous contrast. Multiplanar CT image reconstructions and MIPs were obtained to evaluate the vascular anatomy. CONTRAST:  62m OMNIPAQUE IOHEXOL 350 MG/ML SOLN COMPARISON:  PET CT 01/18/2020 FINDINGS: Cardiovascular: There are no filling defects within the pulmonary arteries to suggest pulmonary embolus. Atherosclerosis of the thoracic aorta. No obvious aortic dissection. Heart is normal in size. Scattered coronary artery calcifications. Trace pericardial fluid which measures up to 9 mm in depth adjacent to the right heart. Right internal jugular central venous catheter tip in the lower SVC. Mediastinum/Nodes: Calcified mediastinal and left hilar lymph nodes consistent with prior granulomatous disease. Mildly  enlarged 11 mm right hilar node. Additional prominent right infrahilar nodes that are subcentimeter. No esophageal wall thickening. No visualized thyroid nodule. No axillary adenopathy. Lungs/Pleura: Heterogeneous pulmonary parenchyma with areas of ground-glass opacities. Calcified granuloma in the left upper lobe. Anterior left upper lobe opacity appears elongated on axial images but more nodular on coronal reformats, for example series 6, image 55 and series 8, image 66. Thin walled cysts in the left upper lobe. Trachea and central bronchi are patent. No findings of pulmonary edema. No pleural fluid. Upper Abdomen: Splenic calcified granuloma. No acute findings. Musculoskeletal: Lucent lesion within left aspect of T9 vertebra consistent with myelomatous lesion is seen on thoracic spine MRI 12/19/2019. Multiple remote bilateral rib fractures. Small cortical lucent lesion within medial left tenth rib, series 8, image 107. Multiple tiny additional rib lesions bilaterally that are all subcentimeter.  Tiny lucent lesion in the sternum, series 5, image 57. No evidence of pathologic or acute fracture. Review of the MIP images confirms the above findings. IMPRESSION: 1. No pulmonary embolus. 2. Heterogeneous pulmonary parenchyma with areas of ground-glass, favoring small airways or small vessel disease. 3. Anterior left upper lobe opacity appears elongated on axial images but more nodular on coronal reformats. Recommend attention on follow-up. 4. Mildly enlarged right hilar node is likely reactive. 5. Lucent lesion in T9 vertebra consistent with myelomatous lesion is seen on thoracic spine MRI 12/19/2019. Multiple additional tiny lucent lesions throughout bilateral ribs and sternum, likely also related to multiple myeloma. No acute or pathologic fracture. Aortic Atherosclerosis (ICD10-I70.0). Electronically Signed   By: Keith Rake M.D.   On: 07/18/2020 16:15    ASSESSMENT & PLAN:   79 y.o. female with  1. Recently diagnosed Multiple Myeloma, RISS Stage III  Labs upon initial presentation from 12/08/18, blood counts are normal including WBC at 7.1k, HGB at 13.1, and PLT at 245k. Calcium normal at 10.3. Creatinine normal at 0.63. M spike at 0.5g. 12/13/18 Bone Scan revealed Multifocal uptake throughout the skeleton, consistent with diffuse metastatic disease. Primary tumor is not specified. 2. Uptake in the proximal right femur, consistent with lytic lesions. 3. Uptake in the ribs bilaterally as described. 4. Lesions in the proximal left humerus. 5. Diffuse uptake throughout the skull consistent with metastatic disease. 6. Right paramedian uptake at the manubrium.  12/13/18 CT Right Femur revealed Numerous lytic lesions involving the right femur and a lytic lesion in the left inferior pubic ramus. Overall appearance is most concerning for multiple myeloma  12/27/18 Pretreatment 24hour UPEP observed an M spike at 80m, and showed 1979mtotal protein/day.  12/27/18 Pretreatment MMP revealed M Protein  at 0.5g with IgG Lambda specificity. Kappa:Lambda light chain ratio at 0.13, with Lambda at 40.3. There is less abnormal protein and light chains than I would expect from 30% plasma cells, which suggests hypo-secretory or non-secretory neoplastic plasma cells. Will have an impact in assessing response. 01/05/19 PET/CT revealed Innumerable lytic lesions in the skeleton compatible with myeloma. Most of the larger lesions are hypermetabolic, for example including a left proximal humeral shaft lesion with maximum SUV of 8.1 and a 2.8 cm lesion in the left T9 vertebral body with maximum SUV 5.1. Most of the smaller lytic lesions, and some of the larger lesions, do not demonstrate accentuated metabolic activity. 2. 1.2 cm in short axis lymph node in the left parapharyngeal space is hypermetabolic with maximum SUV 11.8. I do not see a separate mass in the head and neck to give rise to this hypermetabolic  lymph node. 3. Mosaic attenuation in the lower lobes, nonspecific possibly from air trapping. 4.  Aortic Atherosclerosis 5. Heterogeneous activity in the liver, making it hard to exclude small liver lesions. Consider hepatic protocol MRI with and without contrast for definitive assessment. Nonobstructive right nephrolithiasis. Old granulomatous disease  01/06/19 Bone Marrow biopsy revealed interstitial increase in plasma cells (28% aspirate, 40% CD138 immunohistochemistry). Plasma cells negative for light chains consistent with a non or weakly secretory myeloma   01/06/19 Cytogenetics revealed 37% of cells with trisomy 11 or 11q deletion, and 40.5% of cells with 17p mutation  S/p 5 cycles of KRD treatment  05/31/19 BM Biopsy revealed mild atypical plasmacytosis at 5% with polytypic variation.   06/01/19 PET/CT revealed "Dominant lesion in the LEFT humerus is decreased significantly in metabolic activity. Additional hypermetabolic skeletal lytic lesions have decreased in metabolic activity or similar to comparison  exam (01/05/2019). No evidence of disease progression. 2. Multiple additional lytic lesions do not have metabolic activity and unchanged. 3. No new skeletal lesions are identified. No soft tissue plasmacytoma identified. 4. Nodule / node in the LEFT parapharyngeal space which is intensely hypermetabolic not changed from prior. 5. New hypermetabolic LEFT lower lobe pulmonary nodule is indeterminate. Recommend close attention on follow-up 6. New obstructive hydronephrosis of the RIGHT kidney related to RIGHT UPJ stone."  09/18/2019 BM Bx Report which revealed "Slightly hypercellular bone marrow for age with trilineage hematopoiesis and 1% plasma cells."  09/14/2019 PET/CT Whole Body Scan ([id number]) which revealed "1. There widespread tiny lytic lesions compatible with multiple myeloma. Index larger lesions are generally similar to the prior exam, with low-grade activity such as the left T9 vertebral body lesion with maximum SUV 4.5. Is mild increase in the activity associated with a mildly sclerotic left proximal humeral lesion, maximum SUV 4.8 (previously 3.5). 2. At the site of the prior left lower lobe nodule is currently more bandlike thickening, with maximum SUV only 1.9, probably benign, continued surveillance of this region suggested. 3. There several small but hypermetabolic lymph nodes. This includes a left parapharyngeal space node measuring 1.0 cm with maximum SUV 12.3 (stable); a left level IB lymph node measuring 0.5 cm with maximum SUV 4.8 (slightly larger than prior); and a left inguinal lymph node measuring 0.7 cm in short axis with maximum SUV 6.4 (previously 0.5 cm with maximum SUV 0.6). Significance of these lymph nodes uncertain, surveillance is recommended. 4. New 5 mm left lower lobe subpleural nodule on image 32/8, not appreciably hypermetabolic, surveillance suggested. 5. Focal subcutaneous stranding along the left perineum measuring about 2.6 by 1.1 cm on image 221/4, maximum SUV 12.5.  This was not present previously and is most likely inflammatory, although given the notable SUV, surveillance of this region is suggested. 6. Other imaging findings of potential clinical significance: Aortic Atherosclerosis (ICD10-I70.0). Coronary atherosclerosis. Old granulomatous disease. Mild right hydronephrosis due to a 7 mm right UPJ calculus. 2 mm right kidney upper pole nonobstructive renal calculus. Prominent stool throughout the colon favors constipation."  12/19/2019 Thoracic & Lumbar Spine MRI ([id number]) ([id number]) revealed "Suspected myeloma lesions at T9 and S1. No compression deformity or epidural disease.  01/18/2020 PET/CT ([id number]) which revealed "1. Stable lytic lesions throughout the skeleton. The larger lytic lesions which had mild metabolic activity on comparison exam now have background metabolic activity. No evidence of active myeloma. No evidence of progression multiple myeloma.  No plasmacytoma 3. 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  01/17/19 MRI Liver revealed Several appreciable liver lesions all have benign imaging characteristics. No MRI findings of metastatic involvement of the liver. 2. Scattered bony lesions corresponding to the lytic lesions seen at PET-CT, compatible with active myeloma. 3. Aortic Atherosclerosis.  Mild cardiomegaly. 4. Diffuse hepatic steatosis.   3. Left lower lobe pulmonary nodule First seen on 06/01/19 PET/CT  PLAN: -Discussed 07/18/2020 CT Angio Chest ([id number]) which revealed "1. No pulmonary embolus. 2. Heterogeneous pulmonary parenchyma with areas of ground-glass, favoring small airways or small vessel disease. 3. Anterior left upper lobe  opacity appears elongated on axial images but more nodular on coronal reformats. Recommend attention on follow-up. 4. Mildly enlarged right hilar node is likely reactive. 5. Lucent lesion in T9 vertebra consistent with myelomatous lesion is seen on thoracic spine MRI 12/19/2019. Multiple additional tiny lucent lesions throughout bilateral ribs and sternum, likely also related to multiple myeloma. No acute or pathologic fracture." -Advised pt that Chest CT was not clear enough to determine if bone lesions observed are new or healed.  -No evidence of a PE. Recommend pt restart Revlimid according to where she is in the cycle and hold on day 21.  -Last MMP and K/L light chains were nml, but pt has non-secretory Multiple Myeloma so she may need closer monitoring.  -Advised pt that her borderline elevated D-dimer was not very specific. Revlimid does increase risks for blood clots, but pt's Hx is negative.  -Discussed that preventive dose Eliquis is more protective against blood clots than a baby ASA.  -Recommend pt continue 5 mg Eliquis BID until current prescription is complete then begin 2.5 mg Eliquis BID.  -Recommend pt discontinue Aspirin immediately  -Will get PET/CT in 1 week    FOLLOW UP: PET/CT in 5 -7 days Plz f/u for C19 of treatment as scheduled portflush, labs rx for D1 and D15 plz schedule C20 of treatment D1 and D15. Each with portflush and labs MD visit on C20D1    The total time spent in the appt was 30 minutes and more than 50% was on counseling and direct patient cares.  All of the patient's questions were answered with apparent satisfaction. The patient knows to call the clinic with any problems, questions or concerns.    Sullivan Lone MD [place] AAHIVMS [facility] Hematology/Oncology Physician [facility]  (Office):       [phone] (Work cell):  [phone] (Fax):           [phone]  07/24/2020 5:18 PM  I, Yevette Edwards, am acting as a scribe for Dr.  Sullivan Lone.   .I have reviewed the above documentation for accuracy and completeness, and I agree with the above. Brunetta Genera MD

## 2020-07-30 ENCOUNTER — Other Ambulatory Visit: Payer: Self-pay | Admitting: Hematology

## 2020-07-30 MED ORDER — APIXABAN 2.5 MG PO TABS: 2.5000 mg | ORAL_TABLET | Freq: Two times a day (BID) | ORAL | 5 refills | Status: DC

## 2020-07-31 ENCOUNTER — Other Ambulatory Visit: Payer: Self-pay

## 2020-07-31 ENCOUNTER — Inpatient Hospital Stay: Payer: Medicare PPO

## 2020-07-31 VITALS — BP 135/69 | HR 68 | Temp 98.2°F | Resp 17

## 2020-07-31 DIAGNOSIS — R911 Solitary pulmonary nodule: Secondary | ICD-10-CM | POA: Diagnosis not present

## 2020-07-31 DIAGNOSIS — C9000 Multiple myeloma not having achieved remission: Secondary | ICD-10-CM | POA: Diagnosis not present

## 2020-07-31 DIAGNOSIS — Z79899 Other long term (current) drug therapy: Secondary | ICD-10-CM | POA: Diagnosis not present

## 2020-07-31 DIAGNOSIS — Z5112 Encounter for antineoplastic immunotherapy: Secondary | ICD-10-CM | POA: Diagnosis not present

## 2020-07-31 DIAGNOSIS — Z7189 Other specified counseling: Secondary | ICD-10-CM

## 2020-07-31 DIAGNOSIS — M549 Dorsalgia, unspecified: Secondary | ICD-10-CM | POA: Diagnosis not present

## 2020-07-31 DIAGNOSIS — Z95828 Presence of other vascular implants and grafts: Secondary | ICD-10-CM

## 2020-07-31 DIAGNOSIS — Z800 Family history of malignant neoplasm of digestive organs: Secondary | ICD-10-CM | POA: Diagnosis not present

## 2020-07-31 LAB — CBC WITH DIFFERENTIAL/PLATELET
Abs Immature Granulocytes: 0.04 10*3/uL (ref 0.00–0.07)
Basophils Absolute: 0 10*3/uL (ref 0.0–0.1)
Basophils Relative: 1 %
Eosinophils Absolute: 0.2 10*3/uL (ref 0.0–0.5)
Eosinophils Relative: 4 %
HCT: 37 % (ref 36.0–46.0)
Hemoglobin: 12.4 g/dL (ref 12.0–15.0)
Immature Granulocytes: 1 %
Lymphocytes Relative: 22 %
Lymphs Abs: 1 10*3/uL (ref 0.7–4.0)
MCH: 34.2 pg — ABNORMAL HIGH (ref 26.0–34.0)
MCHC: 33.5 g/dL (ref 30.0–36.0)
MCV: 101.9 fL — ABNORMAL HIGH (ref 80.0–100.0)
Monocytes Absolute: 0.7 10*3/uL (ref 0.1–1.0)
Monocytes Relative: 16 %
Neutro Abs: 2.7 10*3/uL (ref 1.7–7.7)
Neutrophils Relative %: 56 %
Platelets: 172 10*3/uL (ref 150–400)
RBC: 3.63 MIL/uL — ABNORMAL LOW (ref 3.87–5.11)
RDW: 13.1 % (ref 11.5–15.5)
WBC: 4.7 10*3/uL (ref 4.0–10.5)
nRBC: 0 % (ref 0.0–0.2)

## 2020-07-31 LAB — CMP (CANCER CENTER ONLY)
ALT: 10 U/L (ref 0–44)
AST: 9 U/L — ABNORMAL LOW (ref 15–41)
Albumin: 3.2 g/dL — ABNORMAL LOW (ref 3.5–5.0)
Alkaline Phosphatase: 74 U/L (ref 38–126)
Anion gap: 9 (ref 5–15)
BUN: 12 mg/dL (ref 8–23)
CO2: 21 mmol/L — ABNORMAL LOW (ref 22–32)
Calcium: 10.1 mg/dL (ref 8.9–10.3)
Chloride: 109 mmol/L (ref 98–111)
Creatinine: 0.95 mg/dL (ref 0.44–1.00)
GFR, Est AFR Am: >60 mL/min (ref >60)
GFR, Estimated: 58 mL/min — ABNORMAL LOW (ref >60)
Glucose, Bld: 189 mg/dL — ABNORMAL HIGH (ref 70–99)
Potassium: 3.3 mmol/L — ABNORMAL LOW (ref 3.5–5.1)
Sodium: 139 mmol/L (ref 135–145)
Total Bilirubin: 0.6 mg/dL (ref 0.3–1.2)
Total Protein: 6.2 g/dL — ABNORMAL LOW (ref 6.5–8.1)

## 2020-07-31 MED ORDER — SODIUM CHLORIDE 0.9 % IV SOLN
Freq: Once | INTRAVENOUS | Status: AC
  Filled 2020-07-31: qty 250

## 2020-07-31 MED ORDER — PROCHLORPERAZINE MALEATE 10 MG PO TABS
10.0000 mg | ORAL_TABLET | Freq: Once | ORAL | Status: AC
  Administered 2020-07-31: 10 mg via ORAL

## 2020-07-31 MED ORDER — DIPHENHYDRAMINE HCL 25 MG PO CAPS
ORAL_CAPSULE | ORAL | Status: AC
  Filled 2020-07-31: qty 1

## 2020-07-31 MED ORDER — SODIUM CHLORIDE 0.9% FLUSH
10.0000 mL | Freq: Once | INTRAVENOUS | Status: AC
  Administered 2020-07-31: 10 mL
  Filled 2020-07-31: qty 10

## 2020-07-31 MED ORDER — FAMOTIDINE 20 MG PO TABS
ORAL_TABLET | ORAL | Status: AC
  Filled 2020-07-31: qty 1

## 2020-07-31 MED ORDER — DEXAMETHASONE 4 MG PO TABS
ORAL_TABLET | ORAL | Status: AC
  Filled 2020-07-31: qty 5

## 2020-07-31 MED ORDER — DIPHENHYDRAMINE HCL 25 MG PO TABS
25.0000 mg | ORAL_TABLET | Freq: Once | ORAL | Status: AC
  Administered 2020-07-31: 25 mg via ORAL
  Filled 2020-07-31: qty 1

## 2020-07-31 MED ORDER — PROCHLORPERAZINE MALEATE 10 MG PO TABS
ORAL_TABLET | ORAL | Status: AC
  Filled 2020-07-31: qty 1

## 2020-07-31 MED ORDER — DEXTROSE 5 % IV SOLN
56.0000 mg/m2 | Freq: Once | INTRAVENOUS | Status: AC
  Administered 2020-07-31: 100 mg via INTRAVENOUS
  Filled 2020-07-31: qty 5

## 2020-07-31 MED ORDER — SODIUM CHLORIDE 0.9 % IV SOLN
Freq: Once | INTRAVENOUS | Status: DC
  Filled 2020-07-31: qty 250

## 2020-07-31 MED ORDER — DEXAMETHASONE 4 MG PO TABS
20.0000 mg | ORAL_TABLET | Freq: Once | ORAL | Status: AC
  Administered 2020-07-31: 20 mg via ORAL

## 2020-07-31 MED ORDER — FAMOTIDINE 20 MG PO TABS
20.0000 mg | ORAL_TABLET | Freq: Once | ORAL | Status: AC
  Administered 2020-07-31: 20 mg via ORAL

## 2020-07-31 MED ORDER — SODIUM CHLORIDE 0.9% FLUSH
10.0000 mL | INTRAVENOUS | Status: DC | PRN
  Administered 2020-07-31: 10 mL
  Filled 2020-07-31: qty 10

## 2020-07-31 MED ORDER — ACETAMINOPHEN 325 MG PO TABS
ORAL_TABLET | ORAL | Status: AC
  Filled 2020-07-31: qty 2

## 2020-07-31 MED ORDER — ACETAMINOPHEN 325 MG PO TABS
650.0000 mg | ORAL_TABLET | Freq: Once | ORAL | Status: AC
  Administered 2020-07-31: 650 mg via ORAL

## 2020-07-31 MED ORDER — HEPARIN SOD (PORK) LOCK FLUSH 100 UNIT/ML IV SOLN
500.0000 [IU] | Freq: Once | INTRAVENOUS | Status: AC | PRN
  Administered 2020-07-31: 500 [IU]
  Filled 2020-07-31: qty 5

## 2020-07-31 NOTE — Patient Instructions (Signed)
[facility] Discharge Instructions for Patients Receiving Chemotherapy  Today you received the following chemotherapy agents:  Kyprolis  To help prevent nausea and vomiting after your treatment, we encourage you to take your nausea medication as directed.   If you develop nausea and vomiting that is not controlled by your nausea medication, call the clinic.   BELOW ARE SYMPTOMS THAT SHOULD BE REPORTED IMMEDIATELY:  *FEVER GREATER THAN 100.5 F  *CHILLS WITH OR WITHOUT FEVER  NAUSEA AND VOMITING THAT IS NOT CONTROLLED WITH YOUR NAUSEA MEDICATION  *UNUSUAL SHORTNESS OF BREATH  *UNUSUAL BRUISING OR BLEEDING  TENDERNESS IN MOUTH AND THROAT WITH OR WITHOUT PRESENCE OF ULCERS  *URINARY PROBLEMS  *BOWEL PROBLEMS  UNUSUAL RASH Items with * indicate a potential emergency and should be followed up as soon as possible.  Feel free to call the clinic should you have any questions or concerns. The clinic phone number is [phone].  Please show the CHEMO ALERT CARD at check-in to the Emergency Department and triage nurse.   

## 2020-08-08 ENCOUNTER — Other Ambulatory Visit: Payer: Self-pay | Admitting: Hematology

## 2020-08-08 DIAGNOSIS — C9000 Multiple myeloma not having achieved remission: Secondary | ICD-10-CM

## 2020-08-09 ENCOUNTER — Encounter: Payer: Self-pay | Admitting: Hematology

## 2020-08-13 ENCOUNTER — Other Ambulatory Visit: Payer: Self-pay | Admitting: *Deleted

## 2020-08-13 ENCOUNTER — Inpatient Hospital Stay: Payer: Medicare PPO

## 2020-08-13 ENCOUNTER — Other Ambulatory Visit: Payer: Self-pay

## 2020-08-13 ENCOUNTER — Encounter: Payer: Self-pay | Admitting: Hematology

## 2020-08-13 VITALS — BP 129/64 | HR 83 | Temp 98.8°F | Resp 18 | Ht 59.0 in | Wt 154.5 lb

## 2020-08-13 DIAGNOSIS — Z95828 Presence of other vascular implants and grafts: Secondary | ICD-10-CM

## 2020-08-13 DIAGNOSIS — C9000 Multiple myeloma not having achieved remission: Secondary | ICD-10-CM | POA: Diagnosis not present

## 2020-08-13 DIAGNOSIS — Z7189 Other specified counseling: Secondary | ICD-10-CM

## 2020-08-13 DIAGNOSIS — R911 Solitary pulmonary nodule: Secondary | ICD-10-CM | POA: Diagnosis not present

## 2020-08-13 DIAGNOSIS — M549 Dorsalgia, unspecified: Secondary | ICD-10-CM | POA: Diagnosis not present

## 2020-08-13 DIAGNOSIS — Z5112 Encounter for antineoplastic immunotherapy: Secondary | ICD-10-CM | POA: Diagnosis not present

## 2020-08-13 DIAGNOSIS — Z79899 Other long term (current) drug therapy: Secondary | ICD-10-CM | POA: Diagnosis not present

## 2020-08-13 DIAGNOSIS — Z800 Family history of malignant neoplasm of digestive organs: Secondary | ICD-10-CM | POA: Diagnosis not present

## 2020-08-13 LAB — CBC WITH DIFFERENTIAL/PLATELET
Abs Immature Granulocytes: 0.04 10*3/uL (ref 0.00–0.07)
Basophils Absolute: 0 10*3/uL (ref 0.0–0.1)
Basophils Relative: 1 %
Eosinophils Absolute: 0.1 10*3/uL (ref 0.0–0.5)
Eosinophils Relative: 2 %
HCT: 36.7 % (ref 36.0–46.0)
Hemoglobin: 12.5 g/dL (ref 12.0–15.0)
Immature Granulocytes: 1 %
Lymphocytes Relative: 24 %
Lymphs Abs: 1 10*3/uL (ref 0.7–4.0)
MCH: 34.2 pg — ABNORMAL HIGH (ref 26.0–34.0)
MCHC: 34.1 g/dL (ref 30.0–36.0)
MCV: 100.5 fL — ABNORMAL HIGH (ref 80.0–100.0)
Monocytes Absolute: 0.7 10*3/uL (ref 0.1–1.0)
Monocytes Relative: 15 %
Neutro Abs: 2.5 10*3/uL (ref 1.7–7.7)
Neutrophils Relative %: 57 %
Platelets: 131 10*3/uL — ABNORMAL LOW (ref 150–400)
RBC: 3.65 MIL/uL — ABNORMAL LOW (ref 3.87–5.11)
RDW: 13.2 % (ref 11.5–15.5)
WBC: 4.3 10*3/uL (ref 4.0–10.5)
nRBC: 0 % (ref 0.0–0.2)

## 2020-08-13 LAB — CMP (CANCER CENTER ONLY)
ALT: 8 U/L (ref 0–44)
AST: 7 U/L — ABNORMAL LOW (ref 15–41)
Albumin: 3.1 g/dL — ABNORMAL LOW (ref 3.5–5.0)
Alkaline Phosphatase: 77 U/L (ref 38–126)
Anion gap: 9 (ref 5–15)
BUN: 11 mg/dL (ref 8–23)
CO2: 22 mmol/L (ref 22–32)
Calcium: 10 mg/dL (ref 8.9–10.3)
Chloride: 108 mmol/L (ref 98–111)
Creatinine: 0.9 mg/dL (ref 0.44–1.00)
GFR, Est AFR Am: >60 mL/min (ref >60)
GFR, Estimated: >60 mL/min (ref >60)
Glucose, Bld: 217 mg/dL — ABNORMAL HIGH (ref 70–99)
Potassium: 3.1 mmol/L — ABNORMAL LOW (ref 3.5–5.1)
Sodium: 139 mmol/L (ref 135–145)
Total Bilirubin: 0.7 mg/dL (ref 0.3–1.2)
Total Protein: 6 g/dL — ABNORMAL LOW (ref 6.5–8.1)

## 2020-08-13 MED ORDER — DIPHENHYDRAMINE HCL 25 MG PO TABS
25.0000 mg | ORAL_TABLET | Freq: Once | ORAL | Status: AC
  Administered 2020-08-13: 25 mg via ORAL
  Filled 2020-08-13: qty 1

## 2020-08-13 MED ORDER — ACETAMINOPHEN 325 MG PO TABS
ORAL_TABLET | ORAL | Status: AC
  Filled 2020-08-13: qty 2

## 2020-08-13 MED ORDER — SODIUM CHLORIDE 0.9 % IV SOLN
Freq: Once | INTRAVENOUS | Status: AC
  Filled 2020-08-13: qty 250

## 2020-08-13 MED ORDER — ZOLEDRONIC ACID 4 MG/100ML IV SOLN
INTRAVENOUS | Status: AC
  Filled 2020-08-13: qty 100

## 2020-08-13 MED ORDER — DEXAMETHASONE 4 MG PO TABS
ORAL_TABLET | ORAL | Status: AC
  Filled 2020-08-13: qty 5

## 2020-08-13 MED ORDER — PROCHLORPERAZINE MALEATE 10 MG PO TABS
ORAL_TABLET | ORAL | Status: AC
  Filled 2020-08-13: qty 1

## 2020-08-13 MED ORDER — SODIUM CHLORIDE 0.9% FLUSH
10.0000 mL | Freq: Once | INTRAVENOUS | Status: AC
  Administered 2020-08-13: 10 mL
  Filled 2020-08-13: qty 10

## 2020-08-13 MED ORDER — ACETAMINOPHEN 325 MG PO TABS
650.0000 mg | ORAL_TABLET | Freq: Once | ORAL | Status: AC
  Administered 2020-08-13: 650 mg via ORAL

## 2020-08-13 MED ORDER — DIPHENHYDRAMINE HCL 25 MG PO CAPS
ORAL_CAPSULE | ORAL | Status: AC
  Filled 2020-08-13: qty 1

## 2020-08-13 MED ORDER — DEXAMETHASONE 4 MG PO TABS
20.0000 mg | ORAL_TABLET | Freq: Once | ORAL | Status: AC
  Administered 2020-08-13: 20 mg via ORAL

## 2020-08-13 MED ORDER — ZOLEDRONIC ACID 4 MG/100ML IV SOLN
4.0000 mg | Freq: Once | INTRAVENOUS | Status: AC
  Administered 2020-08-13: 4 mg via INTRAVENOUS

## 2020-08-13 MED ORDER — DEXTROSE 5 % IV SOLN
56.0000 mg/m2 | Freq: Once | INTRAVENOUS | Status: AC
  Administered 2020-08-13: 100 mg via INTRAVENOUS
  Filled 2020-08-13: qty 30

## 2020-08-13 MED ORDER — PROCHLORPERAZINE MALEATE 10 MG PO TABS
10.0000 mg | ORAL_TABLET | Freq: Once | ORAL | Status: AC
  Administered 2020-08-13: 10 mg via ORAL

## 2020-08-13 MED ORDER — SODIUM CHLORIDE 0.9% FLUSH
10.0000 mL | INTRAVENOUS | Status: DC | PRN
  Administered 2020-08-13: 10 mL
  Filled 2020-08-13: qty 10

## 2020-08-13 MED ORDER — HEPARIN SOD (PORK) LOCK FLUSH 100 UNIT/ML IV SOLN
500.0000 [IU] | Freq: Once | INTRAVENOUS | Status: AC | PRN
  Administered 2020-08-13: 500 [IU]
  Filled 2020-08-13: qty 5

## 2020-08-13 MED ORDER — FAMOTIDINE 20 MG PO TABS
20.0000 mg | ORAL_TABLET | Freq: Once | ORAL | Status: AC
  Administered 2020-08-13: 20 mg via ORAL

## 2020-08-13 MED ORDER — FAMOTIDINE 20 MG PO TABS
ORAL_TABLET | ORAL | Status: AC
  Filled 2020-08-13: qty 1

## 2020-08-13 MED ORDER — FENTANYL 12 MCG/HR TD PT72: 1.0000 | MEDICATED_PATCH | TRANSDERMAL | 0 refills | Status: DC

## 2020-08-13 NOTE — Patient Instructions (Signed)
[facility] Discharge Instructions for Patients Receiving Chemotherapy  Today you received the following chemotherapy agents Carfilzomib (KYPROLIS).  To help prevent nausea and vomiting after your treatment, we encourage you to take your nausea medication as prescribed.  If you develop nausea and vomiting that is not controlled by your nausea medication, call the clinic.   BELOW ARE SYMPTOMS THAT SHOULD BE REPORTED IMMEDIATELY:  *FEVER GREATER THAN 100.5 F  *CHILLS WITH OR WITHOUT FEVER  NAUSEA AND VOMITING THAT IS NOT CONTROLLED WITH YOUR NAUSEA MEDICATION  *UNUSUAL SHORTNESS OF BREATH  *UNUSUAL BRUISING OR BLEEDING  TENDERNESS IN MOUTH AND THROAT WITH OR WITHOUT PRESENCE OF ULCERS  *URINARY PROBLEMS  *BOWEL PROBLEMS  UNUSUAL RASH Items with * indicate a potential emergency and should be followed up as soon as possible.  Feel free to call the clinic should you have any questions or concerns. The clinic phone number is [phone].  Please show the [place] at check-in to the Emergency Department and triage nurse.  Zoledronic Acid injection (Hypercalcemia, Oncology) What is this medicine? ZOLEDRONIC ACID (ZOE le dron ik AS id) lowers the amount of calcium loss from bone. It is used to treat too much calcium in your blood from cancer. It is also used to prevent complications of cancer that has spread to the bone. This medicine may be used for other purposes; ask your health care provider or pharmacist if you have questions. COMMON BRAND NAME(S): Zometa What should I tell my health care provider before I take this medicine? They need to know if you have any of these conditions:  aspirin-sensitive asthma  cancer, especially if you are receiving medicines used to treat cancer  dental disease or wear dentures  infection  kidney disease  receiving corticosteroids like dexamethasone or prednisone  an unusual or allergic reaction to zoledronic  acid, other medicines, foods, dyes, or preservatives  pregnant or trying to get pregnant  breast-feeding How should I use this medicine? This medicine is for infusion into a vein. It is given by a health care professional in a hospital or clinic setting. Talk to your pediatrician regarding the use of this medicine in children. Special care may be needed. Overdosage: If you think you have taken too much of this medicine contact a poison control center or emergency room at once. NOTE: This medicine is only for you. Do not share this medicine with others. What if I miss a dose? It is important not to miss your dose. Call your doctor or health care professional if you are unable to keep an appointment. What may interact with this medicine?  certain antibiotics given by injection  NSAIDs, medicines for pain and inflammation, like ibuprofen or naproxen  some diuretics like bumetanide, furosemide  teriparatide  thalidomide This list may not describe all possible interactions. Give your health care provider a list of all the medicines, herbs, non-prescription drugs, or dietary supplements you use. Also tell them if you smoke, drink alcohol, or use illegal drugs. Some items may interact with your medicine. What should I watch for while using this medicine? Visit your doctor or health care professional for regular checkups. It may be some time before you see the benefit from this medicine. Do not stop taking your medicine unless your doctor tells you to. Your doctor may order blood tests or other tests to see how you are doing. Women should inform their doctor if they wish to become pregnant or think they might be pregnant.  There is a potential for serious side effects to an unborn child. Talk to your health care professional or pharmacist for more information. You should make sure that you get enough calcium and vitamin D while you are taking this medicine. Discuss the foods you eat and the  vitamins you take with your health care professional. Some people who take this medicine have severe bone, joint, and/or muscle pain. This medicine may also increase your risk for jaw problems or a broken thigh bone. Tell your doctor right away if you have severe pain in your jaw, bones, joints, or muscles. Tell your doctor if you have any pain that does not go away or that gets worse. Tell your dentist and dental surgeon that you are taking this medicine. You should not have major dental surgery while on this medicine. See your dentist to have a dental exam and fix any dental problems before starting this medicine. Take good care of your teeth while on this medicine. Make sure you see your dentist for regular follow-up appointments. What side effects may I notice from receiving this medicine? Side effects that you should report to your doctor or health care professional as soon as possible:  allergic reactions like skin rash, itching or hives, swelling of the face, lips, or tongue  anxiety, confusion, or depression  breathing problems  changes in vision  eye pain  feeling faint or lightheaded, falls  jaw pain, especially after dental work  mouth sores  muscle cramps, stiffness, or weakness  redness, blistering, peeling or loosening of the skin, including inside the mouth  trouble passing urine or change in the amount of urine Side effects that usually do not require medical attention (report to your doctor or health care professional if they continue or are bothersome):  bone, joint, or muscle pain  constipation  diarrhea  fever  hair loss  irritation at site where injected  loss of appetite  nausea, vomiting  stomach upset  trouble sleeping  trouble swallowing  weak or tired This list may not describe all possible side effects. Call your doctor for medical advice about side effects. You may report side effects to FDA at 1-800-FDA-1088. Where should I keep my  medicine? This drug is given in a hospital or clinic and will not be stored at home. NOTE: This sheet is a summary. It may not cover all possible information. If you have questions about this medicine, talk to your doctor, pharmacist, or health care provider.  2020 [employer]vier/Gold Standard (2014-04-28 14:19:39)

## 2020-08-13 NOTE — Telephone Encounter (Signed)
Received refill request for fentanyl patch via mychart

## 2020-08-14 ENCOUNTER — Other Ambulatory Visit: Payer: Self-pay | Admitting: Hematology

## 2020-08-14 DIAGNOSIS — C9000 Multiple myeloma not having achieved remission: Secondary | ICD-10-CM

## 2020-08-20 ENCOUNTER — Encounter: Payer: Self-pay | Admitting: Hematology

## 2020-08-20 ENCOUNTER — Other Ambulatory Visit: Payer: Self-pay | Admitting: *Deleted

## 2020-08-20 DIAGNOSIS — C9000 Multiple myeloma not having achieved remission: Secondary | ICD-10-CM

## 2020-08-20 DIAGNOSIS — Z7189 Other specified counseling: Secondary | ICD-10-CM

## 2020-08-20 MED ORDER — ONDANSETRON HCL 8 MG PO TABS: 8.0000 mg | ORAL_TABLET | Freq: Two times a day (BID) | ORAL | 1 refills | Status: DC | PRN

## 2020-08-20 NOTE — Telephone Encounter (Signed)
Patient sent my chart message - requested refill of ondansetron 8 mg

## 2020-08-22 ENCOUNTER — Other Ambulatory Visit: Payer: Self-pay | Admitting: Internal Medicine

## 2020-08-27 ENCOUNTER — Other Ambulatory Visit: Payer: Self-pay

## 2020-08-27 ENCOUNTER — Ambulatory Visit (HOSPITAL_COMMUNITY)
Admission: RE | Admit: 2020-08-27 | Discharge: 2020-08-27 | Disposition: A | Discharge status: Home / Self Care | Payer: Medicare PPO | Source: Ambulatory Visit | Attending: Hematology | Admitting: Hematology

## 2020-08-27 DIAGNOSIS — R911 Solitary pulmonary nodule: Secondary | ICD-10-CM | POA: Diagnosis not present

## 2020-08-27 DIAGNOSIS — C9000 Multiple myeloma not having achieved remission: Secondary | ICD-10-CM | POA: Diagnosis not present

## 2020-08-27 LAB — GLUCOSE, CAPILLARY: Glucose-Capillary: 134 mg/dL — ABNORMAL HIGH (ref 70–99)

## 2020-08-27 MED ORDER — FLUDEOXYGLUCOSE F - 18 (FDG) INJECTION
7.7400 | Freq: Once | INTRAVENOUS | Status: AC | PRN
  Administered 2020-08-27: 7.74 via INTRAVENOUS

## 2020-08-28 ENCOUNTER — Inpatient Hospital Stay: Payer: Medicare PPO | Admitting: Hematology

## 2020-08-28 ENCOUNTER — Inpatient Hospital Stay: Payer: Medicare PPO

## 2020-08-28 ENCOUNTER — Other Ambulatory Visit: Payer: Self-pay

## 2020-08-28 ENCOUNTER — Inpatient Hospital Stay: Payer: Medicare PPO | Attending: Hematology

## 2020-08-28 ENCOUNTER — Ambulatory Visit: Payer: Medicare PPO

## 2020-08-28 VITALS — BP 137/77 | HR 87 | Temp 98.5°F | Resp 18 | Ht 59.0 in | Wt 157.2 lb

## 2020-08-28 DIAGNOSIS — Z5112 Encounter for antineoplastic immunotherapy: Secondary | ICD-10-CM | POA: Diagnosis not present

## 2020-08-28 DIAGNOSIS — Z23 Encounter for immunization: Secondary | ICD-10-CM | POA: Diagnosis not present

## 2020-08-28 DIAGNOSIS — C9000 Multiple myeloma not having achieved remission: Secondary | ICD-10-CM | POA: Diagnosis not present

## 2020-08-28 DIAGNOSIS — Z7189 Other specified counseling: Secondary | ICD-10-CM

## 2020-08-28 LAB — CMP (CANCER CENTER ONLY)
ALT: 13 U/L (ref 0–44)
AST: 12 U/L — ABNORMAL LOW (ref 15–41)
Albumin: 3.3 g/dL — ABNORMAL LOW (ref 3.5–5.0)
Alkaline Phosphatase: 79 U/L (ref 38–126)
Anion gap: 8 (ref 5–15)
BUN: 13 mg/dL (ref 8–23)
CO2: 21 mmol/L — ABNORMAL LOW (ref 22–32)
Calcium: 9.5 mg/dL (ref 8.9–10.3)
Chloride: 110 mmol/L (ref 98–111)
Creatinine: 0.93 mg/dL (ref 0.44–1.00)
GFR, Est AFR Am: >60 mL/min (ref >60)
GFR, Estimated: 59 mL/min — ABNORMAL LOW (ref >60)
Glucose, Bld: 212 mg/dL — ABNORMAL HIGH (ref 70–99)
Potassium: 3.2 mmol/L — ABNORMAL LOW (ref 3.5–5.1)
Sodium: 139 mmol/L (ref 135–145)
Total Bilirubin: 0.6 mg/dL (ref 0.3–1.2)
Total Protein: 6.3 g/dL — ABNORMAL LOW (ref 6.5–8.1)

## 2020-08-28 LAB — CBC WITH DIFFERENTIAL/PLATELET
Abs Immature Granulocytes: 0.03 10*3/uL (ref 0.00–0.07)
Basophils Absolute: 0.1 10*3/uL (ref 0.0–0.1)
Basophils Relative: 1 %
Eosinophils Absolute: 0.2 10*3/uL (ref 0.0–0.5)
Eosinophils Relative: 3 %
HCT: 37.8 % (ref 36.0–46.0)
Hemoglobin: 12.6 g/dL (ref 12.0–15.0)
Immature Granulocytes: 1 %
Lymphocytes Relative: 24 %
Lymphs Abs: 1.2 10*3/uL (ref 0.7–4.0)
MCH: 33.3 pg (ref 26.0–34.0)
MCHC: 33.3 g/dL (ref 30.0–36.0)
MCV: 100 fL (ref 80.0–100.0)
Monocytes Absolute: 0.5 10*3/uL (ref 0.1–1.0)
Monocytes Relative: 9 %
Neutro Abs: 3 10*3/uL (ref 1.7–7.7)
Neutrophils Relative %: 62 %
Platelets: 158 10*3/uL (ref 150–400)
RBC: 3.78 MIL/uL — ABNORMAL LOW (ref 3.87–5.11)
RDW: 13.4 % (ref 11.5–15.5)
WBC: 5 10*3/uL (ref 4.0–10.5)
nRBC: 0 % (ref 0.0–0.2)

## 2020-08-28 MED ORDER — SODIUM CHLORIDE 0.9 % IV SOLN
Freq: Once | INTRAVENOUS | Status: AC
  Filled 2020-08-28: qty 250

## 2020-08-28 MED ORDER — PROCHLORPERAZINE MALEATE 10 MG PO TABS
ORAL_TABLET | ORAL | Status: AC
  Filled 2020-08-28: qty 1

## 2020-08-28 MED ORDER — DEXAMETHASONE 4 MG PO TABS
20.0000 mg | ORAL_TABLET | Freq: Once | ORAL | Status: AC
  Administered 2020-08-28: 20 mg via ORAL

## 2020-08-28 MED ORDER — DIPHENHYDRAMINE HCL 25 MG PO CAPS
25.0000 mg | ORAL_CAPSULE | Freq: Once | ORAL | Status: AC
  Administered 2020-08-28: 25 mg via ORAL

## 2020-08-28 MED ORDER — HEPARIN SOD (PORK) LOCK FLUSH 100 UNIT/ML IV SOLN
500.0000 [IU] | Freq: Once | INTRAVENOUS | Status: AC | PRN
  Administered 2020-08-28: 500 [IU]
  Filled 2020-08-28: qty 5

## 2020-08-28 MED ORDER — PROCHLORPERAZINE MALEATE 10 MG PO TABS
10.0000 mg | ORAL_TABLET | Freq: Once | ORAL | Status: AC
  Administered 2020-08-28: 10 mg via ORAL

## 2020-08-28 MED ORDER — DEXAMETHASONE 4 MG PO TABS
ORAL_TABLET | ORAL | Status: AC
  Filled 2020-08-28: qty 5

## 2020-08-28 MED ORDER — SODIUM CHLORIDE 0.9 % IV SOLN
Freq: Once | INTRAVENOUS | Status: DC
  Filled 2020-08-28: qty 250

## 2020-08-28 MED ORDER — FAMOTIDINE 20 MG PO TABS
20.0000 mg | ORAL_TABLET | Freq: Once | ORAL | Status: AC
  Administered 2020-08-28: 20 mg via ORAL

## 2020-08-28 MED ORDER — FAMOTIDINE 20 MG PO TABS
ORAL_TABLET | ORAL | Status: AC
  Filled 2020-08-28: qty 1

## 2020-08-28 MED ORDER — ACETAMINOPHEN 325 MG PO TABS
650.0000 mg | ORAL_TABLET | Freq: Once | ORAL | Status: AC
  Administered 2020-08-28: 650 mg via ORAL

## 2020-08-28 MED ORDER — DEXTROSE 5 % IV SOLN
56.0000 mg/m2 | Freq: Once | INTRAVENOUS | Status: AC
  Administered 2020-08-28: 100 mg via INTRAVENOUS
  Filled 2020-08-28: qty 30

## 2020-08-28 MED ORDER — SODIUM CHLORIDE 0.9% FLUSH
10.0000 mL | INTRAVENOUS | Status: DC | PRN
  Administered 2020-08-28: 10 mL
  Filled 2020-08-28: qty 10

## 2020-08-28 MED ORDER — DIPHENHYDRAMINE HCL 25 MG PO CAPS
ORAL_CAPSULE | ORAL | Status: AC
  Filled 2020-08-28: qty 1

## 2020-08-28 MED ORDER — ACETAMINOPHEN 325 MG PO TABS
ORAL_TABLET | ORAL | Status: AC
  Filled 2020-08-28: qty 2

## 2020-08-28 NOTE — Progress Notes (Signed)
   Covid-19 Vaccination Clinic  Name:  Faith Orr    MRN: [id number] DOB: 10-08-1941  08/28/2020  Faith Orr was observed post Covid-19 immunization for 15 minutes without incident. She was provided with Vaccine Information Sheet and instruction to access the V-Safe system.   Faith Orr was instructed to call 911 with any severe reactions post vaccine: Marland Kitchen Difficulty breathing  . Swelling of face and throat  . A fast heartbeat  . A bad rash all over body  . Dizziness and weakness

## 2020-08-28 NOTE — Progress Notes (Signed)
HEMATOLOGY/ONCOLOGY CLINIC NOTE  Date of Service: 08/28/2020  Patient Care Team: Hoyt Koch, MD as PCP - General (Internal Medicine) Lafayette Dragon, MD (Inactive) as Consulting Physician (Gastroenterology) Megan Salon, MD as Consulting Physician (Gynecology) Melrose Nakayama, MD as Consulting Physician (Orthopedic Surgery) Deneise Lever, MD as Consulting Physician (Pulmonary Disease) [name], MD (Ophthalmology)  CHIEF COMPLAINTS/PURPOSE OF CONSULTATION:  Continue mx of myeloma  HISTORY OF PRESENTING ILLNESS:   Faith Orr is a wonderful 79 y.o. female who has been referred to [name] by Dr. Pricilla Orr for evaluation and management of Lytic bone lesions. She is accompanied today by her son in law, and her partner is present via phone. The pt reports that she is doing well overall.   The pt notes that 2-3 months ago while standing at the stove, and otherwise feeling normally, she felt "something snap that took her breath away" in her mid back as she stretched to get something. The pt notes that she saw a chiropractor twice due to her back stiffness, which did not help. The pt then described her new bone pains to her PCP on 12/05/18, and subsequent imaging, as noted below, revealed concerns for numerous bone lesions. She has begun 71m Fosamax. The pt notes that she had hip pain in 2018, and that an XR at that time did not reveal any lesions.  The pt notes that most of her pain is concentrated to her left shoulder presently, and with minimal movement of the arm. She endorses pain radiating into her left arm and notes that her hand is swollen in the mornings when she wakes up. She also endorses present back pain and right hip pain, worse when she walks. She has not yet seen orthopedics. The pt reports that she is needing to take 603mAdvil every 4-6 hours as Tramadol alone has not been able to alleviate her pain. The pt denies any unexpected weight loss, fevers,  chills, or night sweats. The pt notes that her urine is a very dark color presently, but denies overt blood in the urine, underpants, nor tissue paper. The pt notes that in the last two weeks she has had some soreness in her head, but denies new headaches or changes in vision. The pt notes that she has been urinating more frequently overall, but has been trying to stay better hydrated as well.  The pt notes that she has been compliant with annual mammograms.   The pt notes that she had a cyst in her right breast which was removed in the past. She fractured her left wrist in 2008 after falling down stairs. The pt endorses history of fatty liver.   The pt denies ever smoking cigarettes and endorses significant second hand smoke exposure with a previous marriage.   Of note prior to the patient's visit today, pt has had a Bone Scan completed on 12/13/18 with results revealing Multifocal uptake throughout the skeleton, consistent with diffuse metastatic disease. Primary tumor is not specified. 2. Uptake in the proximal right femur, consistent with lytic lesions. 3. Uptake in the ribs bilaterally as described. 4. Lesions in the proximal left humerus. 5. Diffuse uptake throughout the skull consistent with metastatic disease. 6. Right paramedian uptake at the manubrium.  Most recent lab results (12/08/18) of CBC w/diff and CMP is as follows: all values are WNL except for Glucose at 279, BUN at 24, AST at 41, ALT at 46. 12/08/18 SPEP revealed all values WNL except for Total  Protein at 6.0, Albumin at 3.6, Gamma globulin at 0.7, and M spike at 0.5g  On review of systems, pt reports significant left shoulder pain, back pain, right hip pain, dark urine, and denies fevers, chills, night sweats, unexpected weight loss, changes in bowel habits, changes in breathing, cough, new respiratory symptoms, changes in vision, abdominal pains, leg swelling, and any other symptoms.   On PMHx the pt reports fatty liver, and  denies blood clots.  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 Denies ever smoking.  On Family Hx the pt reports maternal grandmother with colon cancer. Father with bladder cancer and amyloidosis (pt notes that this could have been misdiagnosis). Mother with Protein S deficiency and polymyalgia rheumatica.  Current Treatment: Carfilzomib + Revlimid maintenance.  Interval History:   Faith Orr returns today for management and evaluation of her Multiple Myeloma. The patient's last visit with [name] was on 07/24/2020. The pt reports that she is doing well overall.  The pt reports she had significant fatigue over the last week an a half. This fatigue typically begins directly after treatment and last for 5-6 days. She has no SOB, but is also having difficulty falling asleep.  She has continued 2.27m Eliquis and 20 meq Potassium twice per day.  Of note since the patient's last visit, pt has had PET/CT ([id number] completed on 08/27/2020 with results revealing "1. No evidence of active multiple myeloma on FDG PET scan. 2. 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 3. Multiple callused rib fractures with metabolic activity are favored chronic posttraumatic fractures. 4. 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 5. Calcified mediastinal nodes, pulmonary nodule, and splenic granulomata. Prior granulomatous disease."  Lab results today (08/28/20) of CBC w/diff and CMP is as follows: all values are WNL except for RBC at 3.78, Potassium at 3.2, CO2 at 21, Glucose at 212, Total Protein at 6.3, Albumin at 3.3, AST at 12, GFR Est Non Af Am at 59.  On review of systems, pt reports eye itchiness, fatigue, sleeplessness and denies SOB and any other symptoms.   MEDICAL HISTORY:  Past Medical History:  Diagnosis Date  . Allergy    seasonal  . Asthma   . DEPRESSION   .  DIABETES MELLITUS, TYPE II   . Diverticulosis   . HYPERLIPIDEMIA   . Macular degeneration of left eye    mild, Dr.Hecker  . Obesity, unspecified   . Osteoarthritis of both knees   . OSTEOPENIA   . Osteopenia   . URINARY INCONTINENCE     SURGICAL HISTORY: Past Surgical History:  Procedure Laterality Date  . CATARACT EXTRACTION Left 05/24/2018  . CESAREAN SECTION  01/1973  . CYSTOSCOPY/URETEROSCOPY/HOLMIUM LASER/STENT PLACEMENT Right 09/20/2019   Procedure: CYSTOSCOPY/URETEROSCOPY/HOLMIUM LASER/STENT PLACEMENT;  Surgeon: BLucas Mallow MD;  [facility]  Service: Urology;  Laterality: Right;  . FRACTURE SURGERY    . IR IMAGING GUIDED PORT INSERTION  02/20/2019  . left wrist surgery  2008   By Dr. DLatanya Maudlin . right ankle  1994    SOCIAL HISTORY: Social History   Socioeconomic History  . Marital status: Married    Spouse name: Not on file  . Number of children: 1  . Years of education: Not on file  . Highest education level: Not on file  Occupational History    Employer: [employer] Tobacco Use  .  Smoking status: Never Smoker  . Smokeless tobacco: Never Used  . Tobacco comment: Lives with partner [name]) and son  Vaping Use  . Vaping Use: Never used  Substance and Sexual Activity  . Alcohol use: No    Alcohol/week: 0.0 standard drinks  . Drug use: No  . Sexual activity: Never    Partners: Female    Birth control/protection: Post-menopausal    Comment: Lives with female partner (annette hicks) and 43 yo son  Other Topics Concern  . Not on file  Social History Narrative  . Not on file   Social Determinants of Health   Financial Resource Strain:   . Difficulty of Paying Living Expenses: Not on file  Food Insecurity:   . Worried About Charity fundraiser in the Last Year: Not on file  . Ran Out of Food in the Last Year: Not on file  Transportation Needs:   . Lack of Transportation (Medical): Not on file  . Lack of  Transportation (Non-Medical): Not on file  Physical Activity:   . Days of Exercise per Week: Not on file  . Minutes of Exercise per Session: Not on file  Stress:   . Feeling of Stress : Not on file  Social Connections:   . Frequency of Communication with Friends and Family: Not on file  . Frequency of Social Gatherings with Friends and Family: Not on file  . Attends Religious Services: Not on file  . Active Member of Clubs or Organizations: Not on file  . Attends Archivist Meetings: Not on file  . Marital Status: Not on file  Intimate Partner Violence:   . Fear of Current or Ex-Partner: Not on file  . Emotionally Abused: Not on file  . Physically Abused: Not on file  . Sexually Abused: Not on file    FAMILY HISTORY: Family History  Problem Relation Age of Onset  . Diabetes Father   . Hyperlipidemia Father   . Heart disease Father   . Cancer Father   . Hypertension Father   . Colon cancer Paternal Grandmother 71  . Osteoporosis Mother   . Protein S deficiency Mother   . Hyperlipidemia Mother   . Multiple sclerosis Daughter   . Cancer Other        bladder  . Breast cancer Neg Hx     ALLERGIES:  is allergic to penicillins, aleve [naproxen sodium], and sulfonamide derivatives.  MEDICATIONS:  Current Outpatient Medications  Medication Sig Dispense Refill  . acyclovir (ZOVIRAX) 400 MG tablet TAKE 1 TABLET(400 MG) BY MOUTH TWICE DAILY 60 tablet 5  . apixaban (ELIQUIS) 2.5 MG TABS tablet Take 1 tablet (2.5 mg total) by mouth 2 (two) times daily. 60 tablet 5  . Blood Glucose Monitoring Suppl (FREESTYLE FREEDOM LITE) W/DEVICE KIT Use to check blood sugars twice a day Dx 250.00 1 each 0  . Calcium Carbonate-Vitamin D (CALCIUM 600+D HIGH POTENCY) 600-400 MG-UNIT per tablet Take 1 tablet by mouth 2 (two) times daily.     . Cetirizine HCl 10 MG CAPS Take 1 capsule (10 mg total) by mouth daily. 30 capsule 1  . dexamethasone (DECADRON) 4 MG tablet Take 5 tablets (20 mg  total) by mouth once a week. On D22 of each cycle of treatment 20 tablet 5  . doxycycline (VIBRA-TABS) 100 MG tablet Take 1 tablet (100 mg total) by mouth 2 (two) times daily. 14 tablet 1  . fentaNYL (DURAGESIC) 12 MCG/HR Place 1 patch onto the skin  every 3 (three) days. 10 patch 0  . fluticasone (FLONASE) 50 MCG/ACT nasal spray Place 1 spray into both nostrils daily. (Patient taking differently: Place 1 spray into both nostrils daily as needed for allergies or rhinitis. ) 16 g 2  . glipiZIDE (GLUCOTROL XL) 5 MG 24 hr tablet TAKE 1 TABLET(5 MG) BY MOUTH DAILY WITH BREAKFAST 90 tablet 1  . glucose blood (FREESTYLE LITE) test strip CHECK BLOOD SUGAR TWICE DAILY AS DIRECTED Dx 250.00 180 each 3  . Lancets (FREESTYLE) lancets Use twice daily to check sugars. 100 each 11  . lenalidomide (REVLIMID) 15 MG capsule TAKE 1 CAPSULE BY MOUTH  DAILY FOR 21 DAYS ON, THEN  7 DAYS OFF 21 capsule 0  . lidocaine-prilocaine (EMLA) cream APPLY 1 APPLICATION TO THE AFFECTED AREA AS NEEDED. USE PRIOR TO PORT ACCESS 30 g 0  . LORazepam (ATIVAN) 0.5 MG tablet Take 1 tablet (0.5 mg total) by mouth every 8 (eight) hours as needed for anxiety (significant essential tremors). 60 tablet 0  . metFORMIN (GLUCOPHAGE-XR) 500 MG 24 hr tablet TAKE 3 TABLETS(1500 MG) BY MOUTH DAILY WITH BREAKFAST 270 tablet 1  . methylPREDNISolone (MEDROL DOSEPAK) 4 MG TBPK tablet As directed 21 tablet 0  . Multiple Vitamins-Minerals (ICAPS) CAPS Take 1 capsule by mouth daily after breakfast.     . ondansetron (ZOFRAN) 8 MG tablet Take 1 tablet (8 mg total) by mouth 2 (two) times daily as needed (Nausea or vomiting). 30 tablet 1  . Oxycodone HCl 10 MG TABS Take 1 tablet (10 mg total) by mouth every 6 (six) hours as needed. 90 tablet 0  . pantoprazole (PROTONIX) 20 MG tablet TAKE 1 TABLET(20 MG) BY MOUTH DAILY 30 tablet 5  . polyethylene glycol (MIRALAX / GLYCOLAX) packet Take 17 g by mouth daily after breakfast.     . potassium chloride SA (KLOR-CON)  20 MEQ tablet TAKE 1 TABLET(20 MEQ) BY MOUTH TWICE DAILY 60 tablet 1  . prochlorperazine (COMPAZINE) 10 MG tablet Take 1 tablet (10 mg total) by mouth every 6 (six) hours as needed (Nausea or vomiting). 30 tablet 1  . senna-docusate (SENNA S) 8.6-50 MG tablet Take 2 tablets by mouth at bedtime. 60 tablet 2  . sertraline (ZOLOFT) 50 MG tablet TAKE 1 TABLET BY MOUTH DAILY 30 tablet 1  . simvastatin (ZOCOR) 20 MG tablet TAKE 1 TABLET BY MOUTH EVERY DAY AT 6 PM 30 tablet 2  . Triamcinolone Acetonide 0.025 % LOTN Apply 1 application topically 3 (three) times daily as needed (rash/itching). 60 mL 2  . triamcinolone cream (KENALOG) 0.5 % Apply 1 application topically 4 (four) times daily. 90 g 1  . Vitamin D, Ergocalciferol, (DRISDOL) 1.25 MG (50000 UNIT) CAPS capsule TAKE 1 CAPSULE BY MOUTH EVERY 7 DAYS 12 capsule 0   No current facility-administered medications for this visit.   Facility-Administered Medications Ordered in Other Visits  Medication Dose Route Frequency Provider Last Rate Last Admin  . heparin lock flush 100 unit/mL  500 Units Intracatheter Once PRN Brunetta Genera, MD      . sodium chloride flush (NS) 0.9 % injection 10 mL  10 mL Intracatheter PRN Brunetta Genera, MD   10 mL at 10/02/19 1524    REVIEW OF SYSTEMS:   A 10+ POINT REVIEW OF SYSTEMS WAS OBTAINED including neurology, dermatology, psychiatry, cardiac, respiratory, lymph, extremities, GI, GU, Musculoskeletal, constitutional, breasts, reproductive, HEENT.  All pertinent positives are noted in the HPI.  All others are negative.  PHYSICAL EXAMINATION: ECOG FS:2 - Symptomatic, <50% confined to bed  Vitals:   08/28/20 1138  BP: 137/77  Pulse: 87  Resp: 18  Temp: 98.5 F (36.9 C)  SpO2: 99%   Wt Readings from Last 3 Encounters:  08/28/20 157 lb 3.2 oz (71.3 kg)  08/13/20 154 lb 8 oz (70.1 kg)  07/16/20 159 lb 9.6 oz (72.4 kg)   Body mass index is 31.75 kg/m.    GENERAL:alert, in no acute distress and  comfortable SKIN: no acute rashes, no significant lesions EYES: conjunctiva are pink and non-injected, sclera anicteric OROPHARYNX: MMM, no exudates, no oropharyngeal erythema or ulceration NECK: supple, no JVD LYMPH:  no palpable lymphadenopathy in the cervical, axillary or inguinal regions LUNGS: clear to auscultation b/l with normal respiratory effort HEART: regular rate & rhythm ABDOMEN:  normoactive bowel sounds , non tender, not distended. No palpable hepatosplenomegaly.  Extremity: no pedal edema PSYCH: alert & oriented x 3 with fluent speech NEURO: no focal motor/sensory deficits  LABORATORY DATA:  I have reviewed the data as listed  . CBC Latest Ref Rng & Units 08/28/2020 08/13/2020 07/31/2020  WBC 4.0 - 10.5 K/uL 5.0 4.3 4.7  Hemoglobin 12.0 - 15.0 g/dL 12.6 12.5 12.4  Hematocrit 36 - 46 % 37.8 36.7 37.0  Platelets 150 - 400 K/uL 158 131(L) 172   . CBC    Component Value Date/Time   WBC 5.0 08/28/2020 1113   RBC 3.78 (L) 08/28/2020 1113   HGB 12.6 08/28/2020 1113   HGB 12.8 02/16/2019 1138   HCT 37.8 08/28/2020 1113   PLT 158 08/28/2020 1113   PLT 170 02/16/2019 1138   MCV 100.0 08/28/2020 1113   MCH 33.3 08/28/2020 1113   MCHC 33.3 08/28/2020 1113   RDW 13.4 08/28/2020 1113   LYMPHSABS 1.2 08/28/2020 1113   MONOABS 0.5 08/28/2020 1113   EOSABS 0.2 08/28/2020 1113   BASOSABS 0.1 08/28/2020 1113    . CMP Latest Ref Rng & Units 08/28/2020 08/13/2020 07/31/2020  Glucose 70 - 99 mg/dL 212(H) 217(H) 189(H)  BUN 8 - 23 mg/dL '13 11 12  ' Creatinine 0.44 - 1.00 mg/dL 0.93 0.90 0.95  Sodium 135 - 145 mmol/L 139 139 139  Potassium 3.5 - 5.1 mmol/L 3.2(L) 3.1(L) 3.3(L)  Chloride 98 - 111 mmol/L 110 108 109  CO2 22 - 32 mmol/L 21(L) 22 21(L)  Calcium 8.9 - 10.3 mg/dL 9.5 10.0 10.1  Total Protein 6.5 - 8.1 g/dL 6.3(L) 6.0(L) 6.2(L)  Total Bilirubin 0.3 - 1.2 mg/dL 0.6 0.7 0.6  Alkaline Phos 38 - 126 U/L 79 77 74  AST 15 - 41 U/L 12(L) 7(L) 9(L)  ALT 0 - 44 U/L '13 8 10     ' 09/18/2019 BM Bx Report ([id number])   09/18/2019 FISH Panel    05/30/2019 BM Bx   01/06/2019 BM Bx:     01/06/19 Cytogenetics:      05/30/19 BM Biopsy:   09/18/2019 FISH Panel    09/18/2019 BM Surgical Pathology ([id number])     RADIOGRAPHIC STUDIES: I have personally reviewed the radiological images as listed and agreed with the findings in the report. NM PET Image Restage (PS) Whole Body  Result Date: 08/27/2020 CLINICAL DATA:  Subsequent treatment strategy for multiple myeloma. EXAM: NUCLEAR MEDICINE PET WHOLE BODY TECHNIQUE: 7.8 mCi F-18 FDG was injected intravenously. Full-ring PET imaging was performed from the head to foot after the radiotracer. CT data was obtained and used for attenuation correction and anatomic localization. Fasting blood glucose:  134 mg/dl COMPARISON:  PET-CT 01/18/2020 FINDINGS: Mediastinal blood pool activity: SUV max 2.3 HEAD/NECK: Again demonstrated small hypermetabolic nodule in the medial deep LEFT parotid gland unchanged from prior. Incidental CT findings: none CHEST: No hypermetabolic mediastinal or hilar nodes. No suspicious pulmonary nodules on the CT scan. Incidental CT findings: Port in the anterior chest wall with tip in distal SVC. ABDOMEN/PELVIS: No abnormal hypermetabolic activity within the liver, pancreas, adrenal glands, or spleen. No hypermetabolic lymph nodes in the abdomen or pelvis. Incidental CT findings: Uterus and adnexa unremarkable. SKELETON: Again demonstrated hypermetabolic activity associated with chronic RIGHT rib fractures. No change from prior. No focal hypermetabolic activity associated the skeleton suggest metastatic disease. Incidental CT findings: There multiple lytic lesions in the spine without metabolic activity. EXTREMITIES: No focal activity in the extremities suggest skeletal metastasis. Incidental CT findings: No lytic lesion identified. IMPRESSION: 1. No evidence of active multiple myeloma on FDG PET  scan. 2. 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 3. Multiple callused rib fractures with metabolic activity are favored chronic posttraumatic fractures. 4. 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 5. Calcified mediastinal nodes, pulmonary nodule, and splenic granulomata. Prior granulomatous disease. Electronically Signed   By: Suzy Bouchard M.D.   On: 08/27/2020 16:13    ASSESSMENT & PLAN:   79 y.o. female with  1. Recently diagnosed Multiple Myeloma, RISS Stage III  Labs upon initial presentation from 12/08/18, blood counts are normal including WBC at 7.1k, HGB at 13.1, and PLT at 245k. Calcium normal at 10.3. Creatinine normal at 0.63. M spike at 0.5g. 12/13/18 Bone Scan revealed Multifocal uptake throughout the skeleton, consistent with diffuse metastatic disease. Primary tumor is not specified. 2. Uptake in the proximal right femur, consistent with lytic lesions. 3. Uptake in the ribs bilaterally as described. 4. Lesions in the proximal left humerus. 5. Diffuse uptake throughout the skull consistent with metastatic disease. 6. Right paramedian uptake at the manubrium.  12/13/18 CT Right Femur revealed Numerous lytic lesions involving the right femur and a lytic lesion in the left inferior pubic ramus. Overall appearance is most concerning for multiple myeloma  12/27/18 Pretreatment 24hour UPEP observed an M spike at 46m, and showed 1951mtotal protein/day.  12/27/18 Pretreatment MMP revealed M Protein at 0.5g with IgG Lambda specificity. Kappa:Lambda light chain ratio at 0.13, with Lambda at 40.3. There is less abnormal protein and light chains than I would expect from 30% plasma cells, which suggests hypo-secretory or non-secretory neoplastic plasma cells. Will have an impact in assessing response. 01/05/19 PET/CT revealed Innumerable lytic lesions in the skeleton compatible with myeloma. Most of  the larger lesions are hypermetabolic, for example including a left proximal humeral shaft lesion with maximum SUV of 8.1 and a 2.8 cm lesion in the left T9 vertebral body with maximum SUV 5.1. Most of the smaller lytic lesions, and some of the larger lesions, do not demonstrate accentuated metabolic activity. 2. 1.2 cm in short axis lymph node in the left parapharyngeal space is hypermetabolic with maximum SUV 11.8. I do not see a separate mass in the head and neck to give rise to this hypermetabolic lymph node. 3. Mosaic attenuation in the lower lobes, nonspecific possibly from air trapping. 4.  Aortic Atherosclerosis 5. Heterogeneous activity in the liver, making it hard to exclude small liver lesions. Consider hepatic protocol MRI with and without contrast for definitive assessment. Nonobstructive right nephrolithiasis. Old granulomatous disease  01/06/19 Bone Marrow  biopsy revealed interstitial increase in plasma cells (28% aspirate, 40% CD138 immunohistochemistry). Plasma cells negative for light chains consistent with a non or weakly secretory myeloma   01/06/19 Cytogenetics revealed 37% of cells with trisomy 11 or 11q deletion, and 40.5% of cells with 17p mutation  S/p 5 cycles of KRD treatment  05/31/19 BM Biopsy revealed mild atypical plasmacytosis at 5% with polytypic variation.   06/01/19 PET/CT revealed "Dominant lesion in the LEFT humerus is decreased significantly in metabolic activity. Additional hypermetabolic skeletal lytic lesions have decreased in metabolic activity or similar to comparison exam (01/05/2019). No evidence of disease progression. 2. Multiple additional lytic lesions do not have metabolic activity and unchanged. 3. No new skeletal lesions are identified. No soft tissue plasmacytoma identified. 4. Nodule / node in the LEFT parapharyngeal space which is intensely hypermetabolic not changed from prior. 5. New hypermetabolic LEFT lower lobe pulmonary nodule is indeterminate.  Recommend close attention on follow-up 6. New obstructive hydronephrosis of the RIGHT kidney related to RIGHT UPJ stone."  09/18/2019 BM Bx Report which revealed "Slightly hypercellular bone marrow for age with trilineage hematopoiesis and 1% plasma cells."  09/14/2019 PET/CT Whole Body Scan ([id number]) which revealed "1. There widespread tiny lytic lesions compatible with multiple myeloma. Index larger lesions are generally similar to the prior exam, with low-grade activity such as the left T9 vertebral body lesion with maximum SUV 4.5. Is mild increase in the activity associated with a mildly sclerotic left proximal humeral lesion, maximum SUV 4.8 (previously 3.5). 2. At the site of the prior left lower lobe nodule is currently more bandlike thickening, with maximum SUV only 1.9, probably benign, continued surveillance of this region suggested. 3. There several small but hypermetabolic lymph nodes. This includes a left parapharyngeal space node measuring 1.0 cm with maximum SUV 12.3 (stable); a left level IB lymph node measuring 0.5 cm with maximum SUV 4.8 (slightly larger than prior); and a left inguinal lymph node measuring 0.7 cm in short axis with maximum SUV 6.4 (previously 0.5 cm with maximum SUV 0.6). Significance of these lymph nodes uncertain, surveillance is recommended. 4. New 5 mm left lower lobe subpleural nodule on image 32/8, not appreciably hypermetabolic, surveillance suggested. 5. Focal subcutaneous stranding along the left perineum measuring about 2.6 by 1.1 cm on image 221/4, maximum SUV 12.5. This was not present previously and is most likely inflammatory, although given the notable SUV, surveillance of this region is suggested. 6. Other imaging findings of potential clinical significance: Aortic Atherosclerosis (ICD10-I70.0). Coronary atherosclerosis. Old granulomatous disease. Mild right hydronephrosis due to a 7 mm right UPJ calculus. 2 mm right kidney upper pole nonobstructive renal  calculus. Prominent stool throughout the colon favors constipation."  12/19/2019 Thoracic & Lumbar Spine MRI ([id number]) ([id number]) revealed "Suspected myeloma lesions at T9 and S1. No compression deformity or epidural disease.  01/18/2020 PET/CT ([id number]) which revealed "1. Stable lytic lesions throughout the skeleton. The larger lytic lesions which had mild metabolic activity on comparison exam now have background metabolic activity. No evidence of active myeloma. No evidence of progression multiple myeloma.  No plasmacytoma 3. Hypermetabolic nodules in the LEFT neck may be associated deep tissues of the LEFT parotid gland. 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  01/17/19 MRI Liver revealed Several appreciable liver lesions all have benign imaging characteristics. 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 2. Scattered bony lesions corresponding to the lytic lesions seen at PET-CT, compatible with active myeloma. 3. Aortic Atherosclerosis.  Mild cardiomegaly. 4. Diffuse hepatic steatosis.   3. Left lower lobe pulmonary nodule First seen on 06/01/19 PET/CT  PLAN: -Discussed pt labwork today, 08/28/20; blood counts look good, blood chemistries are steady. -Discussed 08/27/2020 PET/CT ([id number]) which revealed "1. No evidence of active multiple myeloma on FDG PET scan. 2. 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 3. Multiple callused rib fractures with metabolic activity are favored chronic posttraumatic fractures. 4. 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 5. Calcified mediastinal nodes, pulmonary nodule, and splenic granulomata. Prior granulomatous disease." -No lab, clinical, or  radiographic evidence of Multiple Myeloma progression at this time.  -The pt has no prohibitive toxicities from continuing C20D1 Carfilzomib at this time. -Will continue Carflizomib at 56 mg/m^2q 2weeks for maintenance. Will consider changing if fatigue becomes limiting.  -Continue 15 mg Revlimid 3 weeks on, 1 week off for maintenance. No prohibitive toxicities at this time. -Recommend pt rinse eyes with warm soap and water regularly and use eye drops.  -Advised pt that proteasome inhibitors increase the risk of styes.  -Recommend pt continue 2.5 Eliquis BID due to Revlimid. -Recommend pt increase Potassium to 40 meq in the morning and 20 meq in the evening.  -Recommend pt increase dietary Potassium. Suggested bananas and coconut water.  -Discussed CDC guidelines regarding COVID19 booster. Offered pt booster today after Tx.  -Recommend pt avoid climbing ladders or lifting more than 20 lbs.   FOLLOW UP: F/u as per next 2 scheduled treatments and labs   The total time spent in the appt was 20 minutes and more than 50% was on counseling and direct patient cares.  All of the patient's questions were answered with apparent satisfaction. The patient knows to call the clinic with any problems, questions or concerns.    Sullivan Lone MD [place] AAHIVMS [facility] Hematology/Oncology Physician [facility]  (Office):       [phone] (Work cell):  [phone] (Fax):           [phone]  08/28/2020 12:45 PM  I, Yevette Edwards, am acting as a scribe for Dr. Sullivan Lone.   .I have reviewed the above documentation for accuracy and completeness, and I agree with the above. Brunetta Genera MD

## 2020-08-28 NOTE — Patient Instructions (Signed)
[facility] Discharge Instructions for Patients Receiving Chemotherapy  Today you received the following chemotherapy agents Carfilzomib (KYPROLIS).  To help prevent nausea and vomiting after your treatment, we encourage you to take your nausea medication as prescribed.  If you develop nausea and vomiting that is not controlled by your nausea medication, call the clinic.   BELOW ARE SYMPTOMS THAT SHOULD BE REPORTED IMMEDIATELY:  *FEVER GREATER THAN 100.5 F  *CHILLS WITH OR WITHOUT FEVER  NAUSEA AND VOMITING THAT IS NOT CONTROLLED WITH YOUR NAUSEA MEDICATION  *UNUSUAL SHORTNESS OF BREATH  *UNUSUAL BRUISING OR BLEEDING  TENDERNESS IN MOUTH AND THROAT WITH OR WITHOUT PRESENCE OF ULCERS  *URINARY PROBLEMS  *BOWEL PROBLEMS  UNUSUAL RASH Items with * indicate a potential emergency and should be followed up as soon as possible.  Feel free to call the clinic should you have any questions or concerns. The clinic phone number is [phone].  Please show the [place] at check-in to the Emergency Department and triage nurse.  Zoledronic Acid injection (Hypercalcemia, Oncology) What is this medicine? ZOLEDRONIC ACID (ZOE le dron ik AS id) lowers the amount of calcium loss from bone. It is used to treat too much calcium in your blood from cancer. It is also used to prevent complications of cancer that has spread to the bone. This medicine may be used for other purposes; ask your health care provider or pharmacist if you have questions. COMMON BRAND NAME(S): Zometa What should I tell my health care provider before I take this medicine? They need to know if you have any of these conditions:  aspirin-sensitive asthma  cancer, especially if you are receiving medicines used to treat cancer  dental disease or wear dentures  infection  kidney disease  receiving corticosteroids like dexamethasone or prednisone  an unusual or allergic reaction to zoledronic  acid, other medicines, foods, dyes, or preservatives  pregnant or trying to get pregnant  breast-feeding How should I use this medicine? This medicine is for infusion into a vein. It is given by a health care professional in a hospital or clinic setting. Talk to your pediatrician regarding the use of this medicine in children. Special care may be needed. Overdosage: If you think you have taken too much of this medicine contact a poison control center or emergency room at once. NOTE: This medicine is only for you. Do not share this medicine with others. What if I miss a dose? It is important not to miss your dose. Call your doctor or health care professional if you are unable to keep an appointment. What may interact with this medicine?  certain antibiotics given by injection  NSAIDs, medicines for pain and inflammation, like ibuprofen or naproxen  some diuretics like bumetanide, furosemide  teriparatide  thalidomide This list may not describe all possible interactions. Give your health care provider a list of all the medicines, herbs, non-prescription drugs, or dietary supplements you use. Also tell them if you smoke, drink alcohol, or use illegal drugs. Some items may interact with your medicine. What should I watch for while using this medicine? Visit your doctor or health care professional for regular checkups. It may be some time before you see the benefit from this medicine. Do not stop taking your medicine unless your doctor tells you to. Your doctor may order blood tests or other tests to see how you are doing. Women should inform their doctor if they wish to become pregnant or think they might be pregnant.  There is a potential for serious side effects to an unborn child. Talk to your health care professional or pharmacist for more information. You should make sure that you get enough calcium and vitamin D while you are taking this medicine. Discuss the foods you eat and the  vitamins you take with your health care professional. Some people who take this medicine have severe bone, joint, and/or muscle pain. This medicine may also increase your risk for jaw problems or a broken thigh bone. Tell your doctor right away if you have severe pain in your jaw, bones, joints, or muscles. Tell your doctor if you have any pain that does not go away or that gets worse. Tell your dentist and dental surgeon that you are taking this medicine. You should not have major dental surgery while on this medicine. See your dentist to have a dental exam and fix any dental problems before starting this medicine. Take good care of your teeth while on this medicine. Make sure you see your dentist for regular follow-up appointments. What side effects may I notice from receiving this medicine? Side effects that you should report to your doctor or health care professional as soon as possible:  allergic reactions like skin rash, itching or hives, swelling of the face, lips, or tongue  anxiety, confusion, or depression  breathing problems  changes in vision  eye pain  feeling faint or lightheaded, falls  jaw pain, especially after dental work  mouth sores  muscle cramps, stiffness, or weakness  redness, blistering, peeling or loosening of the skin, including inside the mouth  trouble passing urine or change in the amount of urine Side effects that usually do not require medical attention (report to your doctor or health care professional if they continue or are bothersome):  bone, joint, or muscle pain  constipation  diarrhea  fever  hair loss  irritation at site where injected  loss of appetite  nausea, vomiting  stomach upset  trouble sleeping  trouble swallowing  weak or tired This list may not describe all possible side effects. Call your doctor for medical advice about side effects. You may report side effects to FDA at 1-800-FDA-1088. Where should I keep my  medicine? This drug is given in a hospital or clinic and will not be stored at home. NOTE: This sheet is a summary. It may not cover all possible information. If you have questions about this medicine, talk to your doctor, pharmacist, or health care provider.  2020 Elsevier/Gold Standard (2014-04-28 14:19:39)

## 2020-08-29 ENCOUNTER — Other Ambulatory Visit: Payer: Self-pay | Admitting: Internal Medicine

## 2020-08-30 ENCOUNTER — Other Ambulatory Visit: Payer: Self-pay | Admitting: *Deleted

## 2020-08-30 ENCOUNTER — Encounter: Payer: Self-pay | Admitting: Hematology

## 2020-08-30 DIAGNOSIS — C9000 Multiple myeloma not having achieved remission: Secondary | ICD-10-CM

## 2020-08-30 NOTE — Telephone Encounter (Signed)
Patient requested refill of Hydrocodone via MyChart. Routed to Dr. Irene Limbo

## 2020-09-02 ENCOUNTER — Other Ambulatory Visit: Payer: Self-pay | Admitting: Hematology

## 2020-09-02 MED ORDER — OXYCODONE HCL 10 MG PO TABS: 10.0000 mg | ORAL_TABLET | Freq: Four times a day (QID) | ORAL | 0 refills | Status: DC | PRN

## 2020-09-04 ENCOUNTER — Other Ambulatory Visit: Payer: Self-pay | Admitting: *Deleted

## 2020-09-04 ENCOUNTER — Encounter: Payer: Self-pay | Admitting: Hematology

## 2020-09-04 DIAGNOSIS — C9000 Multiple myeloma not having achieved remission: Secondary | ICD-10-CM

## 2020-09-04 NOTE — Telephone Encounter (Signed)
Requested refill of Fentanyl patch

## 2020-09-05 MED ORDER — FENTANYL 12 MCG/HR TD PT72: 1.0000 | MEDICATED_PATCH | TRANSDERMAL | 0 refills | Status: DC

## 2020-09-06 ENCOUNTER — Other Ambulatory Visit: Payer: Self-pay | Admitting: Hematology

## 2020-09-06 DIAGNOSIS — C9000 Multiple myeloma not having achieved remission: Secondary | ICD-10-CM

## 2020-09-11 ENCOUNTER — Inpatient Hospital Stay: Payer: Medicare PPO

## 2020-09-11 ENCOUNTER — Other Ambulatory Visit: Payer: Self-pay

## 2020-09-11 VITALS — BP 127/63 | HR 77 | Temp 98.5°F | Resp 16 | Wt 158.2 lb

## 2020-09-11 DIAGNOSIS — C9000 Multiple myeloma not having achieved remission: Secondary | ICD-10-CM | POA: Diagnosis not present

## 2020-09-11 DIAGNOSIS — Z95828 Presence of other vascular implants and grafts: Secondary | ICD-10-CM

## 2020-09-11 DIAGNOSIS — Z7189 Other specified counseling: Secondary | ICD-10-CM

## 2020-09-11 DIAGNOSIS — Z5112 Encounter for antineoplastic immunotherapy: Secondary | ICD-10-CM | POA: Diagnosis not present

## 2020-09-11 DIAGNOSIS — Z23 Encounter for immunization: Secondary | ICD-10-CM | POA: Diagnosis not present

## 2020-09-11 LAB — CBC WITH DIFFERENTIAL/PLATELET
Abs Immature Granulocytes: 0.03 10*3/uL (ref 0.00–0.07)
Basophils Absolute: 0 10*3/uL (ref 0.0–0.1)
Basophils Relative: 1 %
Eosinophils Absolute: 0.3 10*3/uL (ref 0.0–0.5)
Eosinophils Relative: 6 %
HCT: 36.4 % (ref 36.0–46.0)
Hemoglobin: 12.2 g/dL (ref 12.0–15.0)
Immature Granulocytes: 1 %
Lymphocytes Relative: 21 %
Lymphs Abs: 0.9 10*3/uL (ref 0.7–4.0)
MCH: 33.4 pg (ref 26.0–34.0)
MCHC: 33.5 g/dL (ref 30.0–36.0)
MCV: 99.7 fL (ref 80.0–100.0)
Monocytes Absolute: 0.7 10*3/uL (ref 0.1–1.0)
Monocytes Relative: 16 %
Neutro Abs: 2.5 10*3/uL (ref 1.7–7.7)
Neutrophils Relative %: 55 %
Platelets: 129 10*3/uL — ABNORMAL LOW (ref 150–400)
RBC: 3.65 MIL/uL — ABNORMAL LOW (ref 3.87–5.11)
RDW: 13.9 % (ref 11.5–15.5)
WBC: 4.5 10*3/uL (ref 4.0–10.5)
nRBC: 0 % (ref 0.0–0.2)

## 2020-09-11 LAB — CMP (CANCER CENTER ONLY)
ALT: 21 U/L (ref 0–44)
AST: 13 U/L — ABNORMAL LOW (ref 15–41)
Albumin: 3.2 g/dL — ABNORMAL LOW (ref 3.5–5.0)
Alkaline Phosphatase: 83 U/L (ref 38–126)
Anion gap: 8 (ref 5–15)
BUN: 11 mg/dL (ref 8–23)
CO2: 23 mmol/L (ref 22–32)
Calcium: 9.1 mg/dL (ref 8.9–10.3)
Chloride: 108 mmol/L (ref 98–111)
Creatinine: 0.99 mg/dL (ref 0.44–1.00)
GFR, Est AFR Am: >60 mL/min (ref >60)
GFR, Estimated: 55 mL/min — ABNORMAL LOW (ref >60)
Glucose, Bld: 214 mg/dL — ABNORMAL HIGH (ref 70–99)
Potassium: 3.2 mmol/L — ABNORMAL LOW (ref 3.5–5.1)
Sodium: 139 mmol/L (ref 135–145)
Total Bilirubin: 0.8 mg/dL (ref 0.3–1.2)
Total Protein: 6 g/dL — ABNORMAL LOW (ref 6.5–8.1)

## 2020-09-11 MED ORDER — DIPHENHYDRAMINE HCL 25 MG PO TABS
25.0000 mg | ORAL_TABLET | Freq: Once | ORAL | Status: AC
  Administered 2020-09-11: 25 mg via ORAL
  Filled 2020-09-11: qty 1

## 2020-09-11 MED ORDER — DEXAMETHASONE 4 MG PO TABS
ORAL_TABLET | ORAL | Status: AC
  Filled 2020-09-11: qty 5

## 2020-09-11 MED ORDER — PROCHLORPERAZINE MALEATE 10 MG PO TABS
ORAL_TABLET | ORAL | Status: AC
  Filled 2020-09-11: qty 1

## 2020-09-11 MED ORDER — SODIUM CHLORIDE 0.9 % IV SOLN
Freq: Once | INTRAVENOUS | Status: AC
  Filled 2020-09-11: qty 250

## 2020-09-11 MED ORDER — INFLUENZA VAC A&B SA ADJ QUAD 0.5 ML IM PRSY
PREFILLED_SYRINGE | INTRAMUSCULAR | Status: AC
  Filled 2020-09-11: qty 0.5

## 2020-09-11 MED ORDER — PROCHLORPERAZINE MALEATE 10 MG PO TABS
10.0000 mg | ORAL_TABLET | Freq: Once | ORAL | Status: AC
  Administered 2020-09-11: 10 mg via ORAL

## 2020-09-11 MED ORDER — FAMOTIDINE 20 MG PO TABS
ORAL_TABLET | ORAL | Status: AC
  Filled 2020-09-11: qty 1

## 2020-09-11 MED ORDER — INFLUENZA VAC A&B SA ADJ QUAD 0.5 ML IM PRSY
0.5000 mL | PREFILLED_SYRINGE | Freq: Once | INTRAMUSCULAR | Status: AC
  Administered 2020-09-11: 0.5 mL via INTRAMUSCULAR

## 2020-09-11 MED ORDER — SODIUM CHLORIDE 0.9% FLUSH
10.0000 mL | INTRAVENOUS | Status: DC | PRN
  Administered 2020-09-11: 10 mL
  Filled 2020-09-11: qty 10

## 2020-09-11 MED ORDER — ACETAMINOPHEN 325 MG PO TABS
650.0000 mg | ORAL_TABLET | Freq: Once | ORAL | Status: AC
  Administered 2020-09-11: 650 mg via ORAL

## 2020-09-11 MED ORDER — ZOLEDRONIC ACID 4 MG/100ML IV SOLN
INTRAVENOUS | Status: AC
  Filled 2020-09-11: qty 100

## 2020-09-11 MED ORDER — ACETAMINOPHEN 325 MG PO TABS
ORAL_TABLET | ORAL | Status: AC
  Filled 2020-09-11: qty 2

## 2020-09-11 MED ORDER — ZOLEDRONIC ACID 4 MG/100ML IV SOLN
4.0000 mg | Freq: Once | INTRAVENOUS | Status: AC
  Administered 2020-09-11: 4 mg via INTRAVENOUS

## 2020-09-11 MED ORDER — DIPHENHYDRAMINE HCL 25 MG PO CAPS
ORAL_CAPSULE | ORAL | Status: AC
  Filled 2020-09-11: qty 1

## 2020-09-11 MED ORDER — DEXTROSE 5 % IV SOLN: 56.0000 mg/m2 | Freq: Once | INTRAVENOUS | Status: DC

## 2020-09-11 MED ORDER — FAMOTIDINE 20 MG PO TABS
20.0000 mg | ORAL_TABLET | Freq: Once | ORAL | Status: AC
  Administered 2020-09-11: 20 mg via ORAL

## 2020-09-11 MED ORDER — HEPARIN SOD (PORK) LOCK FLUSH 100 UNIT/ML IV SOLN
500.0000 [IU] | Freq: Once | INTRAVENOUS | Status: AC | PRN
  Administered 2020-09-11: 500 [IU]
  Filled 2020-09-11: qty 5

## 2020-09-11 MED ORDER — DEXAMETHASONE 4 MG PO TABS
20.0000 mg | ORAL_TABLET | Freq: Once | ORAL | Status: AC
  Administered 2020-09-11: 20 mg via ORAL

## 2020-09-11 MED ORDER — DEXTROSE 5 % IV SOLN
56.0000 mg/m2 | Freq: Once | INTRAVENOUS | Status: AC
  Administered 2020-09-11: 100 mg via INTRAVENOUS
  Filled 2020-09-11: qty 30

## 2020-09-11 NOTE — Patient Instructions (Signed)
[facility] Discharge Instructions for Patients Receiving Chemotherapy  Today you received the following chemotherapy agent: Carfilzomib (Kyprolis)  To help prevent nausea and vomiting after your treatment, we encourage you to take your nausea medication as directed by your MD.   If you develop nausea and vomiting that is not controlled by your nausea medication, call the clinic.   BELOW ARE SYMPTOMS THAT SHOULD BE REPORTED IMMEDIATELY:  *FEVER GREATER THAN 100.5 F  *CHILLS WITH OR WITHOUT FEVER  NAUSEA AND VOMITING THAT IS NOT CONTROLLED WITH YOUR NAUSEA MEDICATION  *UNUSUAL SHORTNESS OF BREATH  *UNUSUAL BRUISING OR BLEEDING  TENDERNESS IN MOUTH AND THROAT WITH OR WITHOUT PRESENCE OF ULCERS  *URINARY PROBLEMS  *BOWEL PROBLEMS  UNUSUAL RASH Items with * indicate a potential emergency and should be followed up as soon as possible.  Feel free to call the clinic should you have any questions or concerns. The clinic phone number is [phone].  Please show the [place] at check-in to the Emergency Department and triage nurse.   Influenza, Adult Influenza is also called "the flu." It is an infection in the lungs, nose, and throat (respiratory tract). It is caused by a virus. The flu causes symptoms that are similar to symptoms of a cold. It also causes a high fever and body aches. The flu spreads easily from person to person (is contagious). Getting a flu shot (influenza vaccination) every year is the best way to prevent the flu. What are the causes? This condition is caused by the influenza virus. You can get the virus by:  Breathing in droplets that are in the air from the cough or sneeze of a person who has the virus.  Touching something that has the virus on it (is contaminated) and then touching your mouth, nose, or eyes. What increases the risk? Certain things may make you more likely to get the flu. These include:  Not washing your hands  often.  Having close contact with many people during cold and flu season.  Touching your mouth, eyes, or nose without first washing your hands.  Not getting a flu shot every year. You may have a higher risk for the flu, along with serious problems such as a lung infection (pneumonia), if you:  Are older than 65.  Are pregnant.  Have a weakened disease-fighting system (immune system) because of a disease or taking certain medicines.  Have a long-term (chronic) illness, such as: ? Heart, kidney, or lung disease. ? Diabetes. ? Asthma.  Have a liver disorder.  Are very overweight (morbidly obese).  Have anemia. This is a condition that affects your red blood cells. What are the signs or symptoms? Symptoms usually begin suddenly and last 4-14 days. They may include:  Fever and chills.  Headaches, body aches, or muscle aches.  Sore throat.  Cough.  Runny or stuffy (congested) nose.  Chest discomfort.  Not wanting to eat as much as normal (poor appetite).  Weakness or feeling tired (fatigue).  Dizziness.  Feeling sick to your stomach (nauseous) or throwing up (vomiting). How is this treated? If the flu is found early, you can be treated with medicine that can help reduce how bad the illness is and how long it lasts (antiviral medicine). This may be given by mouth (orally) or through an IV tube. Taking care of yourself at home can help your symptoms get better. Your doctor may suggest:  Taking over-the-counter medicines.  Drinking plenty of fluids. The flu often  goes away on its own. If you have very bad symptoms or other problems, you may be treated in a hospital. Follow these instructions at home:     Activity  Rest as needed. Get plenty of sleep.  Stay home from work or school as told by your doctor. ? Do not leave home until you do not have a fever for 24 hours without taking medicine. ? Leave home only to visit your doctor. Eating and drinking  Take an  ORS (oral rehydration solution). This is a drink that is sold at pharmacies and stores.  Drink enough fluid to keep your pee (urine) pale yellow.  Drink clear fluids in small amounts as you are able. Clear fluids include: ? Water. ? Ice chips. ? Fruit juice that has water added (diluted fruit juice). ? Low-calorie sports drinks.  Eat bland, easy-to-digest foods in small amounts as you are able. These foods include: ? Bananas. ? Applesauce. ? Rice. ? Lean meats. ? Toast. ? Crackers.  Do not eat or drink: ? Fluids that have a lot of sugar or caffeine. ? Alcohol. ? Spicy or fatty foods. General instructions  Take over-the-counter and prescription medicines only as told by your doctor.  Use a cool mist humidifier to add moisture to the air in your home. This can make it easier for you to breathe.  Cover your mouth and nose when you cough or sneeze.  Wash your hands with soap and water often, especially after you cough or sneeze. If you cannot use soap and water, use alcohol-based hand sanitizer.  Keep all follow-up visits as told by your doctor. This is important. How is this prevented?   Get a flu shot every year. You may get the flu shot in late summer, fall, or winter. Ask your doctor when you should get your flu shot.  Avoid contact with people who are sick during fall and winter (cold and flu season). Contact a doctor if:  You get new symptoms.  You have: ? Chest pain. ? Watery poop (diarrhea). ? A fever.  Your cough gets worse.  You start to have more mucus.  You feel sick to your stomach.  You throw up. Get help right away if you:  Have shortness of breath.  Have trouble breathing.  Have skin or nails that turn a bluish color.  Have very bad pain or stiffness in your neck.  Get a sudden headache.  Get sudden pain in your face or ear.  Cannot eat or drink without throwing up. Summary  Influenza ("the flu") is an infection in the lungs, nose,  and throat. It is caused by a virus.  Take over-the-counter and prescription medicines only as told by your doctor.  Getting a flu shot every year is the best way to avoid getting the flu. This information is not intended to replace advice given to you by your health care provider. Make sure you discuss any questions you have with your health care provider. Document Revised: 05/18/2018 Document Reviewed: 05/18/2018 Elsevier Patient Education  [employer].  Zoledronic Acid injection (Hypercalcemia, Oncology) What is this medicine? ZOLEDRONIC ACID (ZOE le dron ik AS id) lowers the amount of calcium loss from bone. It is used to treat too much calcium in your blood from cancer. It is also used to prevent complications of cancer that has spread to the bone. This medicine may be used for other purposes; ask your health care provider or pharmacist if you have questions. COMMON BRAND  NAME(S): Zometa What should I tell my health care provider before I take this medicine? They need to know if you have any of these conditions:  aspirin-sensitive asthma  cancer, especially if you are receiving medicines used to treat cancer  dental disease or wear dentures  infection  kidney disease  receiving corticosteroids like dexamethasone or prednisone  an unusual or allergic reaction to zoledronic acid, other medicines, foods, dyes, or preservatives  pregnant or trying to get pregnant  breast-feeding How should I use this medicine? This medicine is for infusion into a vein. It is given by a health care professional in a hospital or clinic setting. Talk to your pediatrician regarding the use of this medicine in children. Special care may be needed. Overdosage: If you think you have taken too much of this medicine contact a poison control center or emergency room at once. NOTE: This medicine is only for you. Do not share this medicine with others. What if I miss a dose? It is important not to  miss your dose. Call your doctor or health care professional if you are unable to keep an appointment. What may interact with this medicine?  certain antibiotics given by injection  NSAIDs, medicines for pain and inflammation, like ibuprofen or naproxen  some diuretics like bumetanide, furosemide  teriparatide  thalidomide This list may not describe all possible interactions. Give your health care provider a list of all the medicines, herbs, non-prescription drugs, or dietary supplements you use. Also tell them if you smoke, drink alcohol, or use illegal drugs. Some items may interact with your medicine. What should I watch for while using this medicine? Visit your doctor or health care professional for regular checkups. It may be some time before you see the benefit from this medicine. Do not stop taking your medicine unless your doctor tells you to. Your doctor may order blood tests or other tests to see how you are doing. Women should inform their doctor if they wish to become pregnant or think they might be pregnant. There is a potential for serious side effects to an unborn child. Talk to your health care professional or pharmacist for more information. You should make sure that you get enough calcium and vitamin D while you are taking this medicine. Discuss the foods you eat and the vitamins you take with your health care professional. Some people who take this medicine have severe bone, joint, and/or muscle pain. This medicine may also increase your risk for jaw problems or a broken thigh bone. Tell your doctor right away if you have severe pain in your jaw, bones, joints, or muscles. Tell your doctor if you have any pain that does not go away or that gets worse. Tell your dentist and dental surgeon that you are taking this medicine. You should not have major dental surgery while on this medicine. See your dentist to have a dental exam and fix any dental problems before starting this  medicine. Take good care of your teeth while on this medicine. Make sure you see your dentist for regular follow-up appointments. What side effects may I notice from receiving this medicine? Side effects that you should report to your doctor or health care professional as soon as possible:  allergic reactions like skin rash, itching or hives, swelling of the face, lips, or tongue  anxiety, confusion, or depression  breathing problems  changes in vision  eye pain  feeling faint or lightheaded, falls  jaw pain, especially after dental work  mouth sores  muscle cramps, stiffness, or weakness  redness, blistering, peeling or loosening of the skin, including inside the mouth  trouble passing urine or change in the amount of urine Side effects that usually do not require medical attention (report to your doctor or health care professional if they continue or are bothersome):  bone, joint, or muscle pain  constipation  diarrhea  fever  hair loss  irritation at site where injected  loss of appetite  nausea, vomiting  stomach upset  trouble sleeping  trouble swallowing  weak or tired This list may not describe all possible side effects. Call your doctor for medical advice about side effects. You may report side effects to FDA at 1-800-FDA-1088. Where should I keep my medicine? This drug is given in a hospital or clinic and will not be stored at home. NOTE: This sheet is a summary. It may not cover all possible information. If you have questions about this medicine, talk to your doctor, pharmacist, or health care provider.  2020 Elsevier/Gold Standard (2014-04-28 14:19:39)

## 2020-09-21 ENCOUNTER — Other Ambulatory Visit: Payer: Self-pay | Admitting: Internal Medicine

## 2020-09-25 ENCOUNTER — Ambulatory Visit: Payer: Medicare PPO

## 2020-09-25 ENCOUNTER — Other Ambulatory Visit: Payer: Medicare PPO

## 2020-09-25 ENCOUNTER — Ambulatory Visit: Payer: Medicare PPO | Admitting: Hematology

## 2020-09-30 ENCOUNTER — Inpatient Hospital Stay: Payer: Medicare PPO | Attending: Hematology

## 2020-09-30 ENCOUNTER — Inpatient Hospital Stay: Payer: Medicare PPO

## 2020-09-30 ENCOUNTER — Other Ambulatory Visit: Payer: Self-pay

## 2020-09-30 ENCOUNTER — Inpatient Hospital Stay: Payer: Medicare PPO | Admitting: Hematology

## 2020-09-30 DIAGNOSIS — Z7189 Other specified counseling: Secondary | ICD-10-CM

## 2020-09-30 DIAGNOSIS — C9000 Multiple myeloma not having achieved remission: Secondary | ICD-10-CM | POA: Diagnosis not present

## 2020-09-30 DIAGNOSIS — Z5112 Encounter for antineoplastic immunotherapy: Secondary | ICD-10-CM | POA: Diagnosis not present

## 2020-09-30 DIAGNOSIS — Z95828 Presence of other vascular implants and grafts: Secondary | ICD-10-CM

## 2020-09-30 LAB — CMP (CANCER CENTER ONLY)
ALT: 19 U/L (ref 0–44)
AST: 14 U/L — ABNORMAL LOW (ref 15–41)
Albumin: 3.2 g/dL — ABNORMAL LOW (ref 3.5–5.0)
Alkaline Phosphatase: 74 U/L (ref 38–126)
Anion gap: 10 (ref 5–15)
BUN: 14 mg/dL (ref 8–23)
CO2: 20 mmol/L — ABNORMAL LOW (ref 22–32)
Calcium: 9.3 mg/dL (ref 8.9–10.3)
Chloride: 110 mmol/L (ref 98–111)
Creatinine: 1.14 mg/dL — ABNORMAL HIGH (ref 0.44–1.00)
GFR, Estimated: 46 mL/min — ABNORMAL LOW (ref >60)
Glucose, Bld: 201 mg/dL — ABNORMAL HIGH (ref 70–99)
Potassium: 3.6 mmol/L (ref 3.5–5.1)
Sodium: 140 mmol/L (ref 135–145)
Total Bilirubin: 0.6 mg/dL (ref 0.3–1.2)
Total Protein: 6.1 g/dL — ABNORMAL LOW (ref 6.5–8.1)

## 2020-09-30 LAB — CBC WITH DIFFERENTIAL/PLATELET
Abs Immature Granulocytes: 0.03 10*3/uL (ref 0.00–0.07)
Basophils Absolute: 0.1 10*3/uL (ref 0.0–0.1)
Basophils Relative: 1 %
Eosinophils Absolute: 0.3 10*3/uL (ref 0.0–0.5)
Eosinophils Relative: 7 %
HCT: 37 % (ref 36.0–46.0)
Hemoglobin: 12.3 g/dL (ref 12.0–15.0)
Immature Granulocytes: 1 %
Lymphocytes Relative: 23 %
Lymphs Abs: 1 10*3/uL (ref 0.7–4.0)
MCH: 34.1 pg — ABNORMAL HIGH (ref 26.0–34.0)
MCHC: 33.2 g/dL (ref 30.0–36.0)
MCV: 102.5 fL — ABNORMAL HIGH (ref 80.0–100.0)
Monocytes Absolute: 1 10*3/uL (ref 0.1–1.0)
Monocytes Relative: 23 %
Neutro Abs: 2.1 10*3/uL (ref 1.7–7.7)
Neutrophils Relative %: 45 %
Platelets: 110 10*3/uL — ABNORMAL LOW (ref 150–400)
RBC: 3.61 MIL/uL — ABNORMAL LOW (ref 3.87–5.11)
RDW: 14.1 % (ref 11.5–15.5)
WBC: 4.5 10*3/uL (ref 4.0–10.5)
nRBC: 0 % (ref 0.0–0.2)

## 2020-09-30 MED ORDER — HEPARIN SOD (PORK) LOCK FLUSH 100 UNIT/ML IV SOLN
500.0000 [IU] | Freq: Once | INTRAVENOUS | Status: AC | PRN
  Administered 2020-09-30: 500 [IU]
  Filled 2020-09-30: qty 5

## 2020-09-30 MED ORDER — FAMOTIDINE 20 MG PO TABS
20.0000 mg | ORAL_TABLET | Freq: Once | ORAL | Status: AC
  Administered 2020-09-30: 20 mg via ORAL

## 2020-09-30 MED ORDER — DIPHENHYDRAMINE HCL 25 MG PO TABS
25.0000 mg | ORAL_TABLET | Freq: Once | ORAL | Status: AC
  Administered 2020-09-30: 25 mg via ORAL
  Filled 2020-09-30: qty 1

## 2020-09-30 MED ORDER — DEXAMETHASONE 4 MG PO TABS
ORAL_TABLET | ORAL | Status: AC
  Filled 2020-09-30: qty 5

## 2020-09-30 MED ORDER — DEXTROSE 5 % IV SOLN
56.0000 mg/m2 | Freq: Once | INTRAVENOUS | Status: AC
  Administered 2020-09-30: 100 mg via INTRAVENOUS
  Filled 2020-09-30: qty 30

## 2020-09-30 MED ORDER — ACETAMINOPHEN 325 MG PO TABS
650.0000 mg | ORAL_TABLET | Freq: Once | ORAL | Status: AC
  Administered 2020-09-30: 650 mg via ORAL

## 2020-09-30 MED ORDER — SODIUM CHLORIDE 0.9 % IV SOLN
Freq: Once | INTRAVENOUS | Status: AC
  Filled 2020-09-30: qty 250

## 2020-09-30 MED ORDER — ACETAMINOPHEN 325 MG PO TABS
ORAL_TABLET | ORAL | Status: AC
  Filled 2020-09-30: qty 2

## 2020-09-30 MED ORDER — PROCHLORPERAZINE MALEATE 10 MG PO TABS
10.0000 mg | ORAL_TABLET | Freq: Once | ORAL | Status: AC
  Administered 2020-09-30: 10 mg via ORAL

## 2020-09-30 MED ORDER — VITAMIN D (ERGOCALCIFEROL) 1.25 MG (50000 UNIT) PO CAPS: ORAL_CAPSULE | ORAL | 0 refills | Status: DC

## 2020-09-30 MED ORDER — DIPHENHYDRAMINE HCL 25 MG PO CAPS
ORAL_CAPSULE | ORAL | Status: AC
  Filled 2020-09-30: qty 1

## 2020-09-30 MED ORDER — SODIUM CHLORIDE 0.9% FLUSH
10.0000 mL | Freq: Once | INTRAVENOUS | Status: AC
  Administered 2020-09-30: 10 mL
  Filled 2020-09-30: qty 10

## 2020-09-30 MED ORDER — DEXAMETHASONE 4 MG PO TABS
12.0000 mg | ORAL_TABLET | Freq: Once | ORAL | Status: AC
  Administered 2020-09-30: 12 mg via ORAL

## 2020-09-30 MED ORDER — FAMOTIDINE 20 MG PO TABS
ORAL_TABLET | ORAL | Status: AC
  Filled 2020-09-30: qty 1

## 2020-09-30 MED ORDER — PROCHLORPERAZINE MALEATE 10 MG PO TABS
ORAL_TABLET | ORAL | Status: AC
  Filled 2020-09-30: qty 1

## 2020-09-30 MED ORDER — SODIUM CHLORIDE 0.9% FLUSH
10.0000 mL | INTRAVENOUS | Status: DC | PRN
  Administered 2020-09-30: 10 mL
  Filled 2020-09-30: qty 10

## 2020-09-30 NOTE — Progress Notes (Signed)
HEMATOLOGY/ONCOLOGY CLINIC NOTE  Date of Service: 09/30/2020  Patient Care Team: Hoyt Koch, MD as PCP - General (Internal Medicine) Lafayette Dragon, MD (Inactive) as Consulting Physician (Gastroenterology) Megan Salon, MD as Consulting Physician (Gynecology) Melrose Nakayama, MD as Consulting Physician (Orthopedic Surgery) Deneise Lever, MD as Consulting Physician (Pulmonary Disease) [name], MD (Ophthalmology)  CHIEF COMPLAINTS/PURPOSE OF CONSULTATION:  Continue mx of myeloma  HISTORY OF PRESENTING ILLNESS:   Faith Orr is a wonderful 79 y.o. female who has been referred to [name] by Dr. Pricilla Holm for evaluation and management of Lytic bone lesions. She is accompanied today by her son in law, and her partner is present via phone. The pt reports that she is doing well overall.   The pt notes that 2-3 months ago while standing at the stove, and otherwise feeling normally, she felt "something snap that took her breath away" in her mid back as she stretched to get something. The pt notes that she saw a chiropractor twice due to her back stiffness, which did not help. The pt then described her new bone pains to her PCP on 12/05/18, and subsequent imaging, as noted below, revealed concerns for numerous bone lesions. She has begun 27m Fosamax. The pt notes that she had hip pain in 2018, and that an XR at that time did not reveal any lesions.  The pt notes that most of her pain is concentrated to her left shoulder presently, and with minimal movement of the arm. She endorses pain radiating into her left arm and notes that her hand is swollen in the mornings when she wakes up. She also endorses present back pain and right hip pain, worse when she walks. She has not yet seen orthopedics. The pt reports that she is needing to take 6033mAdvil every 4-6 hours as Tramadol alone has not been able to alleviate her pain. The pt denies any unexpected weight loss,  fevers, chills, or night sweats. The pt notes that her urine is a very dark color presently, but denies overt blood in the urine, underpants, nor tissue paper. The pt notes that in the last two weeks she has had some soreness in her head, but denies new headaches or changes in vision. The pt notes that she has been urinating more frequently overall, but has been trying to stay better hydrated as well.  The pt notes that she has been compliant with annual mammograms.   The pt notes that she had a cyst in her right breast which was removed in the past. She fractured her left wrist in 2008 after falling down stairs. The pt endorses history of fatty liver.   The pt denies ever smoking cigarettes and endorses significant second hand smoke exposure with a previous marriage.   Of note prior to the patient's visit today, pt has had a Bone Scan completed on 12/13/18 with results revealing Multifocal uptake throughout the skeleton, consistent with diffuse metastatic disease. Primary tumor is not specified. 2. Uptake in the proximal right femur, consistent with lytic lesions. 3. Uptake in the ribs bilaterally as described. 4. Lesions in the proximal left humerus. 5. Diffuse uptake throughout the skull consistent with metastatic disease. 6. Right paramedian uptake at the manubrium.  Most recent lab results (12/08/18) of CBC w/diff and CMP is as follows: all values are WNL except for Glucose at 279, BUN at 24, AST at 41, ALT at 46. 12/08/18 SPEP revealed all values WNL except for Total  Protein at 6.0, Albumin at 3.6, Gamma globulin at 0.7, and M spike at 0.5g  On review of systems, pt reports significant left shoulder pain, back pain, right hip pain, dark urine, and denies fevers, chills, night sweats, unexpected weight loss, changes in bowel habits, changes in breathing, cough, new respiratory symptoms, changes in vision, abdominal pains, leg swelling, and any other symptoms.   On PMHx the pt reports fatty liver,  and denies blood clots.  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 Denies ever smoking.  On Family Hx the pt reports maternal grandmother with colon cancer. Father with bladder cancer and amyloidosis (pt notes that this could have been misdiagnosis). Mother with Protein S deficiency and polymyalgia rheumatica.  Current Treatment: Carfilzomib + Revlimid maintenance.  Interval History:   [name] returns today for management and evaluation of her Multiple Myeloma. The patient's last visit with [name] was on 08/28/2020. The pt reports that she is doing well overall.  The pt reports that she has been feeling well and denies any new concerns. She was able to do some traveling and renew her vows in the interim.  Pt tolerated her flu vaccine well. Pt had small rash in the arm of the injection site with the COVID19 booster. Pt used Kenalog ointment, which improved her symptoms. She denies any issues taking Revlimid.   Lab results today (09/30/20) of CBC w/diff and CMP is as follows: all values are WNL except for RBC at 3.61, MCV at 102.5, MCH at 34.1, PLT at 110K, CO2 at 20, Glucose at 201, Creatinine at 1.14, Total Protein at 6.1, Albumin at 3.2, AST at 14, GFR Est at 46.  On review of systems, pt reports ankle swelling and denies fevers, chills, new bone pain, leg swelling, abdominal pain, chest pain, SOB, abnormal/excessive bleeding and any other symptoms.   MEDICAL HISTORY:  Past Medical History:  Diagnosis Date  . Allergy    seasonal  . Asthma   . DEPRESSION   . DIABETES MELLITUS, TYPE II   . Diverticulosis   . HYPERLIPIDEMIA   . Macular degeneration of left eye    mild, Dr.Hecker  . Obesity, unspecified   . Osteoarthritis of both knees   . OSTEOPENIA   . Osteopenia   . URINARY INCONTINENCE     SURGICAL HISTORY: Past Surgical History:  Procedure Laterality Date  . CATARACT EXTRACTION Left 05/24/2018  . CESAREAN SECTION  01/1973   . CYSTOSCOPY/URETEROSCOPY/HOLMIUM LASER/STENT PLACEMENT Right 09/20/2019   Procedure: CYSTOSCOPY/URETEROSCOPY/HOLMIUM LASER/STENT PLACEMENT;  Surgeon: Lucas Mallow, MD;  Location: [facility];  Service: Urology;  Laterality: Right;  . FRACTURE SURGERY    . IR IMAGING GUIDED PORT INSERTION  02/20/2019  . left wrist surgery  2008   By Dr. Latanya Maudlin  . right ankle  1994    SOCIAL HISTORY: Social History   Socioeconomic History  . Marital status: Married    Spouse name: Not on file  . Number of children: 1  . Years of education: Not on file  . Highest education level: Not on file  Occupational History    Employer: [employer]  Tobacco Use  . Smoking status: Never Smoker  . Smokeless tobacco: Never Used  . Tobacco comment: Lives with partner [name]) and son  Vaping Use  . Vaping Use: Never used  Substance and Sexual Activity  . Alcohol use: No    Alcohol/week: 0.0 standard drinks  .  Drug use: No  . Sexual activity: Never    Partners: Female    Birth control/protection: Post-menopausal    Comment: Lives with female partner (annette hicks) and 36 yo son  Other Topics Concern  . Not on file  Social History Narrative  . Not on file   Social Determinants of Health   Financial Resource Strain:   . Difficulty of Paying Living Expenses: Not on file  Food Insecurity:   . Worried About Charity fundraiser in the Last Year: Not on file  . Ran Out of Food in the Last Year: Not on file  Transportation Needs:   . Lack of Transportation (Medical): Not on file  . Lack of Transportation (Non-Medical): Not on file  Physical Activity:   . Days of Exercise per Week: Not on file  . Minutes of Exercise per Session: Not on file  Stress:   . Feeling of Stress : Not on file  Social Connections:   . Frequency of Communication with Friends and Family: Not on file  . Frequency of Social Gatherings with Friends and Family: Not on file  . Attends Religious  Services: Not on file  . Active Member of Clubs or Organizations: Not on file  . Attends Archivist Meetings: Not on file  . Marital Status: Not on file  Intimate Partner Violence:   . Fear of Current or Ex-Partner: Not on file  . Emotionally Abused: Not on file  . Physically Abused: Not on file  . Sexually Abused: Not on file    FAMILY HISTORY: Family History  Problem Relation Age of Onset  . Diabetes Father   . Hyperlipidemia Father   . Heart disease Father   . Cancer Father   . Hypertension Father   . Colon cancer Paternal Grandmother 53  . Osteoporosis Mother   . Protein S deficiency Mother   . Hyperlipidemia Mother   . Multiple sclerosis Daughter   . Cancer Other        bladder  . Breast cancer Neg Hx     ALLERGIES:  is allergic to penicillins, aleve [naproxen sodium], and sulfonamide derivatives.  MEDICATIONS:  Current Outpatient Medications  Medication Sig Dispense Refill  . acyclovir (ZOVIRAX) 400 MG tablet TAKE 1 TABLET(400 MG) BY MOUTH TWICE DAILY 60 tablet 5  . apixaban (ELIQUIS) 2.5 MG TABS tablet Take 1 tablet (2.5 mg total) by mouth 2 (two) times daily. 60 tablet 5  . Blood Glucose Monitoring Suppl (FREESTYLE FREEDOM LITE) W/DEVICE KIT Use to check blood sugars twice a day Dx 250.00 1 each 0  . Calcium Carbonate-Vitamin D (CALCIUM 600+D HIGH POTENCY) 600-400 MG-UNIT per tablet Take 1 tablet by mouth 2 (two) times daily.     . Cetirizine HCl 10 MG CAPS Take 1 capsule (10 mg total) by mouth daily. 30 capsule 1  . dexamethasone (DECADRON) 4 MG tablet Take 5 tablets (20 mg total) by mouth once a week. On D22 of each cycle of treatment 20 tablet 5  . doxycycline (VIBRA-TABS) 100 MG tablet Take 1 tablet (100 mg total) by mouth 2 (two) times daily. (Patient not taking: Reported on 09/11/2020) 14 tablet 1  . fentaNYL (DURAGESIC) 12 MCG/HR Place 1 patch onto the skin every 3 (three) days. 10 patch 0  . fluticasone (FLONASE) 50 MCG/ACT nasal spray Place 1 spray  into both nostrils daily. (Patient taking differently: Place 1 spray into both nostrils daily as needed for allergies or rhinitis. ) 16 g 2  .  glipiZIDE (GLUCOTROL XL) 5 MG 24 hr tablet TAKE 1 TABLET(5 MG) BY MOUTH DAILY WITH BREAKFAST 90 tablet 1  . glucose blood (FREESTYLE LITE) test strip CHECK BLOOD SUGAR TWICE DAILY AS DIRECTED Dx 250.00 180 each 3  . Lancets (FREESTYLE) lancets Use twice daily to check sugars. 100 each 11  . lenalidomide (REVLIMID) 15 MG capsule TAKE 1 CAPSULE BY MOUTH  DAILY FOR 21 DAYS ON, THEN  7 DAYS OFF 21 capsule 0  . lidocaine-prilocaine (EMLA) cream APPLY 1 APPLICATION TO THE AFFECTED AREA AS NEEDED. USE PRIOR TO PORT ACCESS (Patient not taking: Reported on 09/11/2020) 30 g 0  . LORazepam (ATIVAN) 0.5 MG tablet Take 1 tablet (0.5 mg total) by mouth every 8 (eight) hours as needed for anxiety (significant essential tremors). 60 tablet 0  . metFORMIN (GLUCOPHAGE-XR) 500 MG 24 hr tablet TAKE 3 TABLETS(1500 MG) BY MOUTH DAILY WITH BREAKFAST 270 tablet 1  . methylPREDNISolone (MEDROL DOSEPAK) 4 MG TBPK tablet As directed (Patient not taking: Reported on 09/11/2020) 21 tablet 0  . Multiple Vitamins-Minerals (ICAPS) CAPS Take 1 capsule by mouth daily after breakfast.     . ondansetron (ZOFRAN) 8 MG tablet Take 1 tablet (8 mg total) by mouth 2 (two) times daily as needed (Nausea or vomiting). 30 tablet 1  . Oxycodone HCl 10 MG TABS Take 1 tablet (10 mg total) by mouth every 6 (six) hours as needed. 90 tablet 0  . pantoprazole (PROTONIX) 20 MG tablet TAKE 1 TABLET(20 MG) BY MOUTH DAILY 30 tablet 5  . polyethylene glycol (MIRALAX / GLYCOLAX) packet Take 17 g by mouth daily after breakfast.  (Patient not taking: Reported on 09/11/2020)    . potassium chloride SA (KLOR-CON) 20 MEQ tablet TAKE 1 TABLET(20 MEQ) BY MOUTH TWICE DAILY 60 tablet 1  . prochlorperazine (COMPAZINE) 10 MG tablet Take 1 tablet (10 mg total) by mouth every 6 (six) hours as needed (Nausea or vomiting). 30 tablet 1   . senna-docusate (SENNA S) 8.6-50 MG tablet Take 2 tablets by mouth at bedtime. 60 tablet 2  . sertraline (ZOLOFT) 50 MG tablet TAKE 1 TABLET BY MOUTH DAILY 30 tablet 1  . simvastatin (ZOCOR) 20 MG tablet TAKE 1 TABLET BY MOUTH EVERY DAY AT 6 PM 30 tablet 2  . Triamcinolone Acetonide 0.025 % LOTN Apply 1 application topically 3 (three) times daily as needed (rash/itching). 60 mL 2  . triamcinolone cream (KENALOG) 0.5 % Apply 1 application topically 4 (four) times daily. 90 g 1  . Vitamin D, Ergocalciferol, (DRISDOL) 1.25 MG (50000 UNIT) CAPS capsule TAKE 1 CAPSULE BY MOUTH EVERY 7 DAYS 12 capsule 0   No current facility-administered medications for this visit.   Facility-Administered Medications Ordered in Other Visits  Medication Dose Route Frequency Provider Last Rate Last Admin  . 0.9 %  sodium chloride infusion   Intravenous Once Brunetta Genera, MD      . acetaminophen (TYLENOL) tablet 650 mg  650 mg Oral Once Brunetta Genera, MD      . carfilzomib (KYPROLIS) 100 mg in dextrose 5 % 50 mL chemo infusion  56 mg/m2 (Treatment Plan Recorded) Intravenous Once Brunetta Genera, MD      . dexamethasone (DECADRON) tablet 20 mg  20 mg Oral Once Brunetta Genera, MD      . diphenhydrAMINE (BENADRYL) tablet 25 mg  25 mg Oral Once Brunetta Genera, MD      . famotidine (PEPCID) tablet 20 mg  20 mg Oral  Once Brunetta Genera, MD      . heparin lock flush 100 unit/mL  500 Units Intracatheter Once PRN Brunetta Genera, MD      . heparin lock flush 100 unit/mL  500 Units Intracatheter Once PRN Brunetta Genera, MD      . prochlorperazine (COMPAZINE) tablet 10 mg  10 mg Oral Once Brunetta Genera, MD      . sodium chloride flush (NS) 0.9 % injection 10 mL  10 mL Intracatheter PRN Brunetta Genera, MD   10 mL at 10/02/19 1524  . sodium chloride flush (NS) 0.9 % injection 10 mL  10 mL Intracatheter PRN Brunetta Genera, MD        REVIEW OF SYSTEMS:   A 10+  POINT REVIEW OF SYSTEMS WAS OBTAINED including neurology, dermatology, psychiatry, cardiac, respiratory, lymph, extremities, GI, GU, Musculoskeletal, constitutional, breasts, reproductive, HEENT.  All pertinent positives are noted in the HPI.  All others are negative.    PHYSICAL EXAMINATION: ECOG FS:2 - Symptomatic, <50% confined to bed  Vitals:   09/30/20 0954  BP: 133/65  Pulse: 80  Resp: 18  Temp: 98.4 F (36.9 C)  SpO2: 98%   Wt Readings from Last 3 Encounters:  09/30/20 160 lb 4.8 oz (72.7 kg)  09/11/20 158 lb 4 oz (71.8 kg)  08/28/20 157 lb 3.2 oz (71.3 kg)   Body mass index is 32.38 kg/m.    Exam was given in a chair   GENERAL:alert, in no acute distress and comfortable SKIN: no acute rashes, no significant lesions EYES: conjunctiva are pink and non-injected, sclera anicteric OROPHARYNX: MMM, no exudates, no oropharyngeal erythema or ulceration NECK: supple, no JVD LYMPH:  no palpable lymphadenopathy in the cervical, axillary or inguinal regions LUNGS: clear to auscultation b/l with normal respiratory effort HEART: regular rate & rhythm ABDOMEN:  normoactive bowel sounds , non tender, not distended. No palpable hepatosplenomegaly.  Extremity: no pedal edema PSYCH: alert & oriented x 3 with fluent speech NEURO: no focal motor/sensory deficits  LABORATORY DATA:  I have reviewed the data as listed  . CBC Latest Ref Rng & Units 09/30/2020 09/11/2020 08/28/2020  WBC 4.0 - 10.5 K/uL 4.5 4.5 5.0  Hemoglobin 12.0 - 15.0 g/dL 12.3 12.2 12.6  Hematocrit 36 - 46 % 37.0 36.4 37.8  Platelets 150 - 400 K/uL 110(L) 129(L) 158   . CBC    Component Value Date/Time   WBC 4.5 09/30/2020 0930   RBC 3.61 (L) 09/30/2020 0930   HGB 12.3 09/30/2020 0930   HGB 12.8 02/16/2019 1138   HCT 37.0 09/30/2020 0930   PLT 110 (L) 09/30/2020 0930   PLT 170 02/16/2019 1138   MCV 102.5 (H) 09/30/2020 0930   MCH 34.1 (H) 09/30/2020 0930   MCHC 33.2 09/30/2020 0930   RDW 14.1 09/30/2020  0930   LYMPHSABS 1.0 09/30/2020 0930   MONOABS 1.0 09/30/2020 0930   EOSABS 0.3 09/30/2020 0930   BASOSABS 0.1 09/30/2020 0930    . CMP Latest Ref Rng & Units 09/30/2020 09/11/2020 08/28/2020  Glucose 70 - 99 mg/dL 201(H) 214(H) 212(H)  BUN 8 - 23 mg/dL '14 11 13  ' Creatinine 0.44 - 1.00 mg/dL 1.14(H) 0.99 0.93  Sodium 135 - 145 mmol/L 140 139 139  Potassium 3.5 - 5.1 mmol/L 3.6 3.2(L) 3.2(L)  Chloride 98 - 111 mmol/L 110 108 110  CO2 22 - 32 mmol/L 20(L) 23 21(L)  Calcium 8.9 - 10.3 mg/dL 9.3 9.1 9.5  Total Protein  6.5 - 8.1 g/dL 6.1(L) 6.0(L) 6.3(L)  Total Bilirubin 0.3 - 1.2 mg/dL 0.6 0.8 0.6  Alkaline Phos 38 - 126 U/L 74 83 79  AST 15 - 41 U/L 14(L) 13(L) 12(L)  ALT 0 - 44 U/L '19 21 13   ' 09/18/2019 BM Bx Report ([id number])   09/18/2019 FISH Panel    05/30/2019 BM Bx   01/06/2019 BM Bx:     01/06/19 Cytogenetics:      05/30/19 BM Biopsy:   09/18/2019 FISH Panel    09/18/2019 BM Surgical Pathology ([id number])     RADIOGRAPHIC STUDIES: I have personally reviewed the radiological images as listed and agreed with the findings in the report. No results found.  ASSESSMENT & PLAN:   79 y.o. female with  1. Recently diagnosed Multiple Myeloma, RISS Stage III  Labs upon initial presentation from 12/08/18, blood counts are normal including WBC at 7.1k, HGB at 13.1, and PLT at 245k. Calcium normal at 10.3. Creatinine normal at 0.63. M spike at 0.5g. 12/13/18 Bone Scan revealed Multifocal uptake throughout the skeleton, consistent with diffuse metastatic disease. Primary tumor is not specified. 2. Uptake in the proximal right femur, consistent with lytic lesions. 3. Uptake in the ribs bilaterally as described. 4. Lesions in the proximal left humerus. 5. Diffuse uptake throughout the skull consistent with metastatic disease. 6. Right paramedian uptake at the manubrium.  12/13/18 CT Right Femur revealed Numerous lytic lesions involving the right femur and a  lytic lesion in the left inferior pubic ramus. Overall appearance is most concerning for multiple myeloma  12/27/18 Pretreatment 24hour UPEP observed an M spike at 19m, and showed 1973mtotal protein/day.  12/27/18 Pretreatment MMP revealed M Protein at 0.5g with IgG Lambda specificity. Kappa:Lambda light chain ratio at 0.13, with Lambda at 40.3. There is less abnormal protein and light chains than I would expect from 30% plasma cells, which suggests hypo-secretory or non-secretory neoplastic plasma cells. Will have an impact in assessing response. 01/05/19 PET/CT revealed Innumerable lytic lesions in the skeleton compatible with myeloma. Most of the larger lesions are hypermetabolic, for example including a left proximal humeral shaft lesion with maximum SUV of 8.1 and a 2.8 cm lesion in the left T9 vertebral body with maximum SUV 5.1. Most of the smaller lytic lesions, and some of the larger lesions, do not demonstrate accentuated metabolic activity. 2. 1.2 cm in short axis lymph node in the left parapharyngeal space is hypermetabolic with maximum SUV 11.8. I do not see a separate mass in the head and neck to give rise to this hypermetabolic lymph node. 3. Mosaic attenuation in the lower lobes, nonspecific possibly from air trapping. 4.  Aortic Atherosclerosis 5. Heterogeneous activity in the liver, making it hard to exclude small liver lesions. Consider hepatic protocol MRI with and without contrast for definitive assessment. Nonobstructive right nephrolithiasis. Old granulomatous disease  01/06/19 Bone Marrow biopsy revealed interstitial increase in plasma cells (28% aspirate, 40% CD138 immunohistochemistry). Plasma cells negative for light chains consistent with a non or weakly secretory myeloma   01/06/19 Cytogenetics revealed 37% of cells with trisomy 11 or 11q deletion, and 40.5% of cells with 17p mutation  S/p 5 cycles of KRD treatment  05/31/19 BM Biopsy revealed mild atypical plasmacytosis at 5%  with polytypic variation.   06/01/19 PET/CT revealed "Dominant lesion in the LEFT humerus is decreased significantly in metabolic activity. Additional hypermetabolic skeletal lytic lesions have decreased in metabolic activity or similar to comparison exam (01/05/2019). No evidence of disease progression.  2. Multiple additional lytic lesions do not have metabolic activity and unchanged. 3. No new skeletal lesions are identified. No soft tissue plasmacytoma identified. 4. Nodule / node in the LEFT parapharyngeal space which is intensely hypermetabolic not changed from prior. 5. New hypermetabolic LEFT lower lobe pulmonary nodule is indeterminate. Recommend close attention on follow-up 6. New obstructive hydronephrosis of the RIGHT kidney related to RIGHT UPJ stone."  09/18/2019 BM Bx Report which revealed "Slightly hypercellular bone marrow for age with trilineage hematopoiesis and 1% plasma cells."  09/14/2019 PET/CT Whole Body Scan ([id number]) which revealed "1. There widespread tiny lytic lesions compatible with multiple myeloma. Index larger lesions are generally similar to the prior exam, with low-grade activity such as the left T9 vertebral body lesion with maximum SUV 4.5. Is mild increase in the activity associated with a mildly sclerotic left proximal humeral lesion, maximum SUV 4.8 (previously 3.5). 2. At the site of the prior left lower lobe nodule is currently more bandlike thickening, with maximum SUV only 1.9, probably benign, continued surveillance of this region suggested. 3. There several small but hypermetabolic lymph nodes. This includes a left parapharyngeal space node measuring 1.0 cm with maximum SUV 12.3 (stable); a left level IB lymph node measuring 0.5 cm with maximum SUV 4.8 (slightly larger than prior); and a left inguinal lymph node measuring 0.7 cm in short axis with maximum SUV 6.4 (previously 0.5 cm with maximum SUV 0.6). Significance of these lymph nodes uncertain, surveillance is  recommended. 4. New 5 mm left lower lobe subpleural nodule on image 32/8, not appreciably hypermetabolic, surveillance suggested. 5. Focal subcutaneous stranding along the left perineum measuring about 2.6 by 1.1 cm on image 221/4, maximum SUV 12.5. This was not present previously and is most likely inflammatory, although given the notable SUV, surveillance of this region is suggested. 6. Other imaging findings of potential clinical significance: Aortic Atherosclerosis (ICD10-I70.0). Coronary atherosclerosis. Old granulomatous disease. Mild right hydronephrosis due to a 7 mm right UPJ calculus. 2 mm right kidney upper pole nonobstructive renal calculus. Prominent stool throughout the colon favors constipation."  12/19/2019 Thoracic & Lumbar Spine MRI ([id number]) ([id number]) revealed "Suspected myeloma lesions at T9 and S1. No compression deformity or epidural disease.  01/18/2020 PET/CT ([id number]) which revealed "1. Stable lytic lesions throughout the skeleton. The larger lytic lesions which had mild metabolic activity on comparison exam now have background metabolic activity. No evidence of active myeloma. No evidence of progression multiple myeloma.  No plasmacytoma 3. Hypermetabolic nodules in the LEFT neck may be associated deep tissues of the LEFT parotid gland. 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  01/17/19 MRI Liver revealed Several appreciable liver lesions all have benign imaging characteristics. No MRI findings of metastatic involvement of the liver. 2. Scattered bony lesions corresponding to the lytic lesions seen at PET-CT, compatible with active myeloma. 3. Aortic Atherosclerosis.  Mild cardiomegaly. 4. Diffuse hepatic steatosis.   3. Left lower lobe pulmonary nodule First seen on 06/01/19  PET/CT  PLAN: -Discussed pt labwork today, 09/30/20; blood counts and chemistries are relatively stable, Glucose remains borderline elevated. -No lab or clinical evidence of Multiple Myeloma progression at this time. -The pt has no prohibitive toxicities from continuing C21D1 Carfilzomib at this time. -Will continue Carflizomib at 56 mg/m^2q 2weeks for maintenance. Will consider changing if fatigue becomes limiting.  -Continue 15 mg Revlimid  3 weeks on, 1 week off for maintenance. No prohibitive toxicities at this time.  -Will slowly taper Dexamethasone to improve blood glucose levels. Will give 15 mg today.  -Recommend pt continue 2.5 Eliquis BID. -Continue Zometa q4weeks -Refill Vitamin D  -Will see back in 2 months with labs   FOLLOW UP: Plz schedule next 2 cycles (4 doses of Carfilzomib) with portflush and labs with each treatment Continue Zometa every 4 weeksx6 MD visit in 8 weeks   The total time spent in the appt was 20 minutes and more than 50% was on counseling and direct patient cares.  All of the patient's questions were answered with apparent satisfaction. The patient knows to call the clinic with any problems, questions or concerns.    Sullivan Lone MD [place] AAHIVMS [facility] Hematology/Oncology Physician [facility]  (Office):       [phone] (Work cell):  [phone] (Fax):           [phone]  09/30/2020 10:39 AM  I, Yevette Edwards, am acting as a scribe for Dr. Sullivan Lone.   .I have reviewed the above documentation for accuracy and completeness, and I agree with the above. Brunetta Genera MD

## 2020-09-30 NOTE — Patient Instructions (Signed)

## 2020-09-30 NOTE — Patient Instructions (Signed)
[facility] Discharge Instructions for Patients Receiving Chemotherapy  Today you received the following chemotherapy agents: carfilzomib.  To help prevent nausea and vomiting after your treatment, we encourage you to take your nausea medication as directed.   If you develop nausea and vomiting that is not controlled by your nausea medication, call the clinic.   BELOW ARE SYMPTOMS THAT SHOULD BE REPORTED IMMEDIATELY:  *FEVER GREATER THAN 100.5 F  *CHILLS WITH OR WITHOUT FEVER  NAUSEA AND VOMITING THAT IS NOT CONTROLLED WITH YOUR NAUSEA MEDICATION  *UNUSUAL SHORTNESS OF BREATH  *UNUSUAL BRUISING OR BLEEDING  TENDERNESS IN MOUTH AND THROAT WITH OR WITHOUT PRESENCE OF ULCERS  *URINARY PROBLEMS  *BOWEL PROBLEMS  UNUSUAL RASH Items with * indicate a potential emergency and should be followed up as soon as possible.  Feel free to call the clinic should you have any questions or concerns. The clinic phone number is [phone].  Please show the CHEMO ALERT CARD at check-in to the Emergency Department and triage nurse.   

## 2020-09-30 NOTE — Progress Notes (Signed)
Decrease Dex premed to 12mg  po per MD.  Hardie Pulley, PharmD, BCPS, BCOP

## 2020-09-30 NOTE — Progress Notes (Signed)
Verbal order/Dr. Irene Limbo: May give Dexamethasone 12 mg instead of 15 mg due to medication doses available

## 2020-10-01 ENCOUNTER — Other Ambulatory Visit: Payer: Self-pay | Admitting: Hematology

## 2020-10-01 DIAGNOSIS — C9000 Multiple myeloma not having achieved remission: Secondary | ICD-10-CM

## 2020-10-03 ENCOUNTER — Other Ambulatory Visit: Payer: Self-pay

## 2020-10-03 DIAGNOSIS — C9000 Multiple myeloma not having achieved remission: Secondary | ICD-10-CM

## 2020-10-03 MED ORDER — LENALIDOMIDE 15 MG PO CAPS: ORAL_CAPSULE | ORAL | 0 refills | Status: DC

## 2020-10-06 ENCOUNTER — Other Ambulatory Visit: Payer: Self-pay | Admitting: Internal Medicine

## 2020-10-11 ENCOUNTER — Telehealth: Payer: Self-pay | Admitting: Hematology

## 2020-10-11 NOTE — Telephone Encounter (Signed)
Scheduled per los, patient has been called and notified. 

## 2020-10-12 ENCOUNTER — Encounter: Payer: Self-pay | Admitting: Hematology

## 2020-10-14 ENCOUNTER — Other Ambulatory Visit: Payer: Self-pay | Admitting: *Deleted

## 2020-10-14 DIAGNOSIS — C9000 Multiple myeloma not having achieved remission: Secondary | ICD-10-CM

## 2020-10-14 MED ORDER — OXYCODONE HCL 10 MG PO TABS: 10.0000 mg | ORAL_TABLET | Freq: Four times a day (QID) | ORAL | 0 refills | Status: DC | PRN

## 2020-10-14 NOTE — Telephone Encounter (Signed)
Refill  requested for oxycodone. 

## 2020-10-20 ENCOUNTER — Encounter: Payer: Self-pay | Admitting: Hematology

## 2020-10-21 ENCOUNTER — Inpatient Hospital Stay: Payer: Medicare PPO

## 2020-10-21 ENCOUNTER — Inpatient Hospital Stay: Payer: Medicare PPO | Attending: Hematology

## 2020-10-21 ENCOUNTER — Other Ambulatory Visit: Payer: Self-pay | Admitting: Hematology

## 2020-10-21 ENCOUNTER — Other Ambulatory Visit: Payer: Self-pay

## 2020-10-21 VITALS — BP 141/67 | HR 72 | Temp 98.1°F | Resp 16

## 2020-10-21 DIAGNOSIS — Z95828 Presence of other vascular implants and grafts: Secondary | ICD-10-CM

## 2020-10-21 DIAGNOSIS — C9000 Multiple myeloma not having achieved remission: Secondary | ICD-10-CM | POA: Insufficient documentation

## 2020-10-21 DIAGNOSIS — Z7189 Other specified counseling: Secondary | ICD-10-CM

## 2020-10-21 DIAGNOSIS — Z5112 Encounter for antineoplastic immunotherapy: Secondary | ICD-10-CM | POA: Diagnosis not present

## 2020-10-21 LAB — CMP (CANCER CENTER ONLY)
ALT: 13 U/L (ref 0–44)
AST: 12 U/L — ABNORMAL LOW (ref 15–41)
Albumin: 3.3 g/dL — ABNORMAL LOW (ref 3.5–5.0)
Alkaline Phosphatase: 63 U/L (ref 38–126)
Anion gap: 9 (ref 5–15)
BUN: 11 mg/dL (ref 8–23)
CO2: 21 mmol/L — ABNORMAL LOW (ref 22–32)
Calcium: 9.2 mg/dL (ref 8.9–10.3)
Chloride: 109 mmol/L (ref 98–111)
Creatinine: 1.05 mg/dL — ABNORMAL HIGH (ref 0.44–1.00)
GFR, Estimated: 55 mL/min — ABNORMAL LOW (ref >60)
Glucose, Bld: 226 mg/dL — ABNORMAL HIGH (ref 70–99)
Potassium: 3.8 mmol/L (ref 3.5–5.1)
Sodium: 139 mmol/L (ref 135–145)
Total Bilirubin: 0.7 mg/dL (ref 0.3–1.2)
Total Protein: 6.2 g/dL — ABNORMAL LOW (ref 6.5–8.1)

## 2020-10-21 LAB — CBC WITH DIFFERENTIAL/PLATELET
Abs Immature Granulocytes: 0.01 10*3/uL (ref 0.00–0.07)
Basophils Absolute: 0 10*3/uL (ref 0.0–0.1)
Basophils Relative: 1 %
Eosinophils Absolute: 0.1 10*3/uL (ref 0.0–0.5)
Eosinophils Relative: 4 %
HCT: 36.6 % (ref 36.0–46.0)
Hemoglobin: 12.2 g/dL (ref 12.0–15.0)
Immature Granulocytes: 0 %
Lymphocytes Relative: 35 %
Lymphs Abs: 1 10*3/uL (ref 0.7–4.0)
MCH: 33.7 pg (ref 26.0–34.0)
MCHC: 33.3 g/dL (ref 30.0–36.0)
MCV: 101.1 fL — ABNORMAL HIGH (ref 80.0–100.0)
Monocytes Absolute: 0.3 10*3/uL (ref 0.1–1.0)
Monocytes Relative: 10 %
Neutro Abs: 1.5 10*3/uL — ABNORMAL LOW (ref 1.7–7.7)
Neutrophils Relative %: 50 %
Platelets: 139 10*3/uL — ABNORMAL LOW (ref 150–400)
RBC: 3.62 MIL/uL — ABNORMAL LOW (ref 3.87–5.11)
RDW: 14 % (ref 11.5–15.5)
WBC: 2.9 10*3/uL — ABNORMAL LOW (ref 4.0–10.5)
nRBC: 0 % (ref 0.0–0.2)

## 2020-10-21 MED ORDER — SODIUM CHLORIDE 0.9 % IV SOLN
Freq: Once | INTRAVENOUS | Status: AC
  Filled 2020-10-21: qty 250

## 2020-10-21 MED ORDER — PROCHLORPERAZINE MALEATE 10 MG PO TABS
ORAL_TABLET | ORAL | Status: AC
  Filled 2020-10-21: qty 1

## 2020-10-21 MED ORDER — SODIUM CHLORIDE 0.9% FLUSH
10.0000 mL | INTRAVENOUS | Status: DC | PRN
  Administered 2020-10-21: 10 mL
  Filled 2020-10-21: qty 10

## 2020-10-21 MED ORDER — SODIUM CHLORIDE 0.9% FLUSH
10.0000 mL | Freq: Once | INTRAVENOUS | Status: AC
  Administered 2020-10-21: 10 mL
  Filled 2020-10-21: qty 10

## 2020-10-21 MED ORDER — FAMOTIDINE 20 MG PO TABS
20.0000 mg | ORAL_TABLET | Freq: Once | ORAL | Status: AC
  Administered 2020-10-21: 20 mg via ORAL

## 2020-10-21 MED ORDER — FAMOTIDINE 20 MG PO TABS
ORAL_TABLET | ORAL | Status: AC
  Filled 2020-10-21: qty 1

## 2020-10-21 MED ORDER — ACETAMINOPHEN 325 MG PO TABS
ORAL_TABLET | ORAL | Status: AC
  Filled 2020-10-21: qty 2

## 2020-10-21 MED ORDER — ZOLEDRONIC ACID 4 MG/100ML IV SOLN
4.0000 mg | Freq: Once | INTRAVENOUS | Status: AC
  Administered 2020-10-21: 4 mg via INTRAVENOUS

## 2020-10-21 MED ORDER — DIPHENHYDRAMINE HCL 25 MG PO TABS
25.0000 mg | ORAL_TABLET | Freq: Once | ORAL | Status: AC
  Administered 2020-10-21: 25 mg via ORAL
  Filled 2020-10-21: qty 1

## 2020-10-21 MED ORDER — FENTANYL 12 MCG/HR TD PT72
1.0000 | MEDICATED_PATCH | TRANSDERMAL | 0 refills | Status: DC
Start: 2020-10-21 — End: 2020-11-22

## 2020-10-21 MED ORDER — HEPARIN SOD (PORK) LOCK FLUSH 100 UNIT/ML IV SOLN
500.0000 [IU] | Freq: Once | INTRAVENOUS | Status: DC
  Filled 2020-10-21: qty 5

## 2020-10-21 MED ORDER — HEPARIN SOD (PORK) LOCK FLUSH 100 UNIT/ML IV SOLN
500.0000 [IU] | Freq: Once | INTRAVENOUS | Status: AC | PRN
  Administered 2020-10-21: 500 [IU]
  Filled 2020-10-21: qty 5

## 2020-10-21 MED ORDER — DEXAMETHASONE 4 MG PO TABS
12.0000 mg | ORAL_TABLET | Freq: Once | ORAL | Status: AC
  Administered 2020-10-21: 12 mg via ORAL

## 2020-10-21 MED ORDER — DEXAMETHASONE 4 MG PO TABS
ORAL_TABLET | ORAL | Status: AC
  Filled 2020-10-21: qty 3

## 2020-10-21 MED ORDER — DEXTROSE 5 % IV SOLN
56.0000 mg/m2 | Freq: Once | INTRAVENOUS | Status: AC
  Administered 2020-10-21: 100 mg via INTRAVENOUS
  Filled 2020-10-21: qty 50

## 2020-10-21 MED ORDER — DIPHENHYDRAMINE HCL 25 MG PO CAPS
ORAL_CAPSULE | ORAL | Status: AC
  Filled 2020-10-21: qty 1

## 2020-10-21 MED ORDER — PROCHLORPERAZINE MALEATE 10 MG PO TABS
10.0000 mg | ORAL_TABLET | Freq: Once | ORAL | Status: AC
  Administered 2020-10-21: 10 mg via ORAL

## 2020-10-21 MED ORDER — ACETAMINOPHEN 325 MG PO TABS
650.0000 mg | ORAL_TABLET | Freq: Once | ORAL | Status: AC
  Administered 2020-10-21: 650 mg via ORAL

## 2020-10-21 MED ORDER — ZOLEDRONIC ACID 4 MG/100ML IV SOLN
INTRAVENOUS | Status: AC
  Filled 2020-10-21: qty 100

## 2020-10-21 NOTE — Patient Instructions (Signed)
[facility] Discharge Instructions for Patients Receiving Chemotherapy  Today you received the following chemotherapy agents: carfilzomib.  To help prevent nausea and vomiting after your treatment, we encourage you to take your nausea medication as directed.   If you develop nausea and vomiting that is not controlled by your nausea medication, call the clinic.   BELOW ARE SYMPTOMS THAT SHOULD BE REPORTED IMMEDIATELY:  *FEVER GREATER THAN 100.5 F  *CHILLS WITH OR WITHOUT FEVER  NAUSEA AND VOMITING THAT IS NOT CONTROLLED WITH YOUR NAUSEA MEDICATION  *UNUSUAL SHORTNESS OF BREATH  *UNUSUAL BRUISING OR BLEEDING  TENDERNESS IN MOUTH AND THROAT WITH OR WITHOUT PRESENCE OF ULCERS  *URINARY PROBLEMS  *BOWEL PROBLEMS  UNUSUAL RASH Items with * indicate a potential emergency and should be followed up as soon as possible.  Feel free to call the clinic should you have any questions or concerns. The clinic phone number is [phone].  Please show the CHEMO ALERT CARD at check-in to the Emergency Department and triage nurse.   

## 2020-10-21 NOTE — Patient Instructions (Signed)

## 2020-10-24 ENCOUNTER — Other Ambulatory Visit: Payer: Self-pay | Admitting: Hematology

## 2020-10-24 DIAGNOSIS — C9000 Multiple myeloma not having achieved remission: Secondary | ICD-10-CM

## 2020-10-27 ENCOUNTER — Other Ambulatory Visit: Payer: Self-pay | Admitting: Hematology

## 2020-10-29 ENCOUNTER — Other Ambulatory Visit: Payer: Self-pay | Admitting: Hematology

## 2020-10-29 ENCOUNTER — Encounter: Payer: Self-pay | Admitting: Hematology

## 2020-10-29 DIAGNOSIS — C9000 Multiple myeloma not having achieved remission: Secondary | ICD-10-CM

## 2020-10-29 NOTE — Telephone Encounter (Signed)
11/19 is earliest date to complete "Prescriber Survey" for Auth # - will refill at that time

## 2020-11-01 ENCOUNTER — Other Ambulatory Visit: Payer: Self-pay | Admitting: *Deleted

## 2020-11-01 DIAGNOSIS — C9000 Multiple myeloma not having achieved remission: Secondary | ICD-10-CM

## 2020-11-01 MED ORDER — LENALIDOMIDE 15 MG PO CAPS: ORAL_CAPSULE | ORAL | 0 refills | Status: DC

## 2020-11-04 ENCOUNTER — Inpatient Hospital Stay: Payer: Medicare PPO

## 2020-11-04 ENCOUNTER — Other Ambulatory Visit: Payer: Self-pay | Admitting: Hematology

## 2020-11-04 ENCOUNTER — Other Ambulatory Visit: Payer: Self-pay

## 2020-11-04 VITALS — BP 120/70 | HR 72 | Temp 97.9°F | Resp 16 | Wt 159.5 lb

## 2020-11-04 DIAGNOSIS — Z95828 Presence of other vascular implants and grafts: Secondary | ICD-10-CM

## 2020-11-04 DIAGNOSIS — Z5112 Encounter for antineoplastic immunotherapy: Secondary | ICD-10-CM | POA: Diagnosis not present

## 2020-11-04 DIAGNOSIS — C9000 Multiple myeloma not having achieved remission: Secondary | ICD-10-CM

## 2020-11-04 DIAGNOSIS — Z7189 Other specified counseling: Secondary | ICD-10-CM

## 2020-11-04 LAB — CMP (CANCER CENTER ONLY)
ALT: 14 U/L (ref 0–44)
AST: 10 U/L — ABNORMAL LOW (ref 15–41)
Albumin: 3.3 g/dL — ABNORMAL LOW (ref 3.5–5.0)
Alkaline Phosphatase: 78 U/L (ref 38–126)
Anion gap: 9 (ref 5–15)
BUN: 12 mg/dL (ref 8–23)
CO2: 22 mmol/L (ref 22–32)
Calcium: 9.3 mg/dL (ref 8.9–10.3)
Chloride: 108 mmol/L (ref 98–111)
Creatinine: 1.06 mg/dL — ABNORMAL HIGH (ref 0.44–1.00)
GFR, Estimated: 54 mL/min — ABNORMAL LOW (ref >60)
Glucose, Bld: 208 mg/dL — ABNORMAL HIGH (ref 70–99)
Potassium: 3.7 mmol/L (ref 3.5–5.1)
Sodium: 139 mmol/L (ref 135–145)
Total Bilirubin: 0.7 mg/dL (ref 0.3–1.2)
Total Protein: 6.1 g/dL — ABNORMAL LOW (ref 6.5–8.1)

## 2020-11-04 LAB — CBC WITH DIFFERENTIAL/PLATELET
Abs Immature Granulocytes: 0.02 10*3/uL (ref 0.00–0.07)
Basophils Absolute: 0 10*3/uL (ref 0.0–0.1)
Basophils Relative: 1 %
Eosinophils Absolute: 0.2 10*3/uL (ref 0.0–0.5)
Eosinophils Relative: 5 %
HCT: 36.5 % (ref 36.0–46.0)
Hemoglobin: 12.3 g/dL (ref 12.0–15.0)
Immature Granulocytes: 1 %
Lymphocytes Relative: 28 %
Lymphs Abs: 0.9 10*3/uL (ref 0.7–4.0)
MCH: 34.3 pg — ABNORMAL HIGH (ref 26.0–34.0)
MCHC: 33.7 g/dL (ref 30.0–36.0)
MCV: 101.7 fL — ABNORMAL HIGH (ref 80.0–100.0)
Monocytes Absolute: 0.6 10*3/uL (ref 0.1–1.0)
Monocytes Relative: 18 %
Neutro Abs: 1.6 10*3/uL — ABNORMAL LOW (ref 1.7–7.7)
Neutrophils Relative %: 47 %
Platelets: 106 10*3/uL — ABNORMAL LOW (ref 150–400)
RBC: 3.59 MIL/uL — ABNORMAL LOW (ref 3.87–5.11)
RDW: 13.8 % (ref 11.5–15.5)
WBC: 3.4 10*3/uL — ABNORMAL LOW (ref 4.0–10.5)
nRBC: 0 % (ref 0.0–0.2)

## 2020-11-04 MED ORDER — DEXAMETHASONE 4 MG PO TABS
ORAL_TABLET | ORAL | Status: AC
  Filled 2020-11-04: qty 3

## 2020-11-04 MED ORDER — ACETAMINOPHEN 325 MG PO TABS
ORAL_TABLET | ORAL | Status: AC
  Filled 2020-11-04: qty 2

## 2020-11-04 MED ORDER — DEXTROSE 5 % IV SOLN
56.0000 mg/m2 | Freq: Once | INTRAVENOUS | Status: AC
  Administered 2020-11-04: 100 mg via INTRAVENOUS
  Filled 2020-11-04: qty 30

## 2020-11-04 MED ORDER — HEPARIN SOD (PORK) LOCK FLUSH 100 UNIT/ML IV SOLN
500.0000 [IU] | Freq: Once | INTRAVENOUS | Status: AC | PRN
  Administered 2020-11-04: 500 [IU]
  Filled 2020-11-04: qty 5

## 2020-11-04 MED ORDER — FAMOTIDINE 20 MG PO TABS
20.0000 mg | ORAL_TABLET | Freq: Once | ORAL | Status: AC
  Administered 2020-11-04: 20 mg via ORAL

## 2020-11-04 MED ORDER — SODIUM CHLORIDE 0.9 % IV SOLN
Freq: Once | INTRAVENOUS | Status: AC
  Filled 2020-11-04: qty 250

## 2020-11-04 MED ORDER — ACETAMINOPHEN 325 MG PO TABS
650.0000 mg | ORAL_TABLET | Freq: Once | ORAL | Status: AC
  Administered 2020-11-04: 650 mg via ORAL

## 2020-11-04 MED ORDER — DIPHENHYDRAMINE HCL 25 MG PO TABS
25.0000 mg | ORAL_TABLET | Freq: Once | ORAL | Status: AC
  Administered 2020-11-04: 25 mg via ORAL
  Filled 2020-11-04: qty 1

## 2020-11-04 MED ORDER — PROCHLORPERAZINE MALEATE 10 MG PO TABS
ORAL_TABLET | ORAL | Status: AC
  Filled 2020-11-04: qty 1

## 2020-11-04 MED ORDER — DEXTROSE 5 % IV SOLN
56.0000 mg/m2 | Freq: Once | INTRAVENOUS | Status: DC
  Filled 2020-11-04: qty 50

## 2020-11-04 MED ORDER — PROCHLORPERAZINE MALEATE 10 MG PO TABS
10.0000 mg | ORAL_TABLET | Freq: Once | ORAL | Status: AC
  Administered 2020-11-04: 10 mg via ORAL

## 2020-11-04 MED ORDER — FAMOTIDINE 20 MG PO TABS
ORAL_TABLET | ORAL | Status: AC
  Filled 2020-11-04: qty 1

## 2020-11-04 MED ORDER — DEXAMETHASONE 4 MG PO TABS
12.0000 mg | ORAL_TABLET | Freq: Once | ORAL | Status: AC
  Administered 2020-11-04: 12 mg via ORAL

## 2020-11-04 MED ORDER — SODIUM CHLORIDE 0.9% FLUSH
10.0000 mL | INTRAVENOUS | Status: DC | PRN
  Administered 2020-11-04: 10 mL
  Filled 2020-11-04: qty 10

## 2020-11-04 MED ORDER — DIPHENHYDRAMINE HCL 25 MG PO CAPS
ORAL_CAPSULE | ORAL | Status: AC
  Filled 2020-11-04: qty 1

## 2020-11-04 MED ORDER — SODIUM CHLORIDE 0.9% FLUSH
10.0000 mL | Freq: Once | INTRAVENOUS | Status: AC
  Administered 2020-11-04: 10 mL
  Filled 2020-11-04: qty 10

## 2020-11-04 NOTE — Patient Instructions (Signed)
[facility] Discharge Instructions for Patients Receiving Chemotherapy  Today you received the following chemotherapy agents: carfilzomib.  To help prevent nausea and vomiting after your treatment, we encourage you to take your nausea medication as directed.   If you develop nausea and vomiting that is not controlled by your nausea medication, call the clinic.   BELOW ARE SYMPTOMS THAT SHOULD BE REPORTED IMMEDIATELY:  *FEVER GREATER THAN 100.5 F  *CHILLS WITH OR WITHOUT FEVER  NAUSEA AND VOMITING THAT IS NOT CONTROLLED WITH YOUR NAUSEA MEDICATION  *UNUSUAL SHORTNESS OF BREATH  *UNUSUAL BRUISING OR BLEEDING  TENDERNESS IN MOUTH AND THROAT WITH OR WITHOUT PRESENCE OF ULCERS  *URINARY PROBLEMS  *BOWEL PROBLEMS  UNUSUAL RASH Items with * indicate a potential emergency and should be followed up as soon as possible.  Feel free to call the clinic should you have any questions or concerns. The clinic phone number is [phone].  Please show the CHEMO ALERT CARD at check-in to the Emergency Department and triage nurse.   

## 2020-11-17 ENCOUNTER — Other Ambulatory Visit: Payer: Self-pay | Admitting: Family

## 2020-11-17 ENCOUNTER — Other Ambulatory Visit: Payer: Self-pay | Admitting: Hematology

## 2020-11-17 ENCOUNTER — Encounter: Payer: Self-pay | Admitting: Hematology

## 2020-11-17 DIAGNOSIS — C9000 Multiple myeloma not having achieved remission: Secondary | ICD-10-CM

## 2020-11-17 DIAGNOSIS — Z7189 Other specified counseling: Secondary | ICD-10-CM

## 2020-11-18 ENCOUNTER — Other Ambulatory Visit: Payer: Self-pay | Admitting: *Deleted

## 2020-11-18 DIAGNOSIS — C9000 Multiple myeloma not having achieved remission: Secondary | ICD-10-CM

## 2020-11-18 MED ORDER — OXYCODONE HCL 10 MG PO TABS: 10.0000 mg | ORAL_TABLET | Freq: Four times a day (QID) | ORAL | 0 refills | Status: DC | PRN

## 2020-11-18 NOTE — Telephone Encounter (Signed)
Mychart request to refill oxycodone

## 2020-11-22 ENCOUNTER — Other Ambulatory Visit: Payer: Self-pay | Admitting: *Deleted

## 2020-11-22 ENCOUNTER — Encounter: Payer: Self-pay | Admitting: Hematology

## 2020-11-22 DIAGNOSIS — C9000 Multiple myeloma not having achieved remission: Secondary | ICD-10-CM

## 2020-11-22 MED ORDER — FENTANYL 12 MCG/HR TD PT72: 1.0000 | MEDICATED_PATCH | TRANSDERMAL | 0 refills | Status: DC

## 2020-11-22 NOTE — Telephone Encounter (Signed)
Refill requested for Fentanyl patch. 

## 2020-11-24 ENCOUNTER — Other Ambulatory Visit: Payer: Self-pay | Admitting: Internal Medicine

## 2020-11-24 ENCOUNTER — Encounter: Payer: Self-pay | Admitting: Internal Medicine

## 2020-11-25 ENCOUNTER — Other Ambulatory Visit: Payer: Self-pay | Admitting: Hematology

## 2020-11-25 ENCOUNTER — Inpatient Hospital Stay: Payer: Medicare PPO | Attending: Hematology

## 2020-11-25 ENCOUNTER — Inpatient Hospital Stay: Payer: Medicare PPO

## 2020-11-25 ENCOUNTER — Other Ambulatory Visit: Payer: Self-pay

## 2020-11-25 ENCOUNTER — Inpatient Hospital Stay (HOSPITAL_BASED_OUTPATIENT_CLINIC_OR_DEPARTMENT_OTHER): Payer: Medicare PPO | Admitting: Hematology

## 2020-11-25 VITALS — BP 126/66 | HR 83 | Temp 97.5°F | Resp 18 | Ht 59.0 in | Wt 157.1 lb

## 2020-11-25 DIAGNOSIS — Z5111 Encounter for antineoplastic chemotherapy: Secondary | ICD-10-CM

## 2020-11-25 DIAGNOSIS — Z95828 Presence of other vascular implants and grafts: Secondary | ICD-10-CM

## 2020-11-25 DIAGNOSIS — Z7189 Other specified counseling: Secondary | ICD-10-CM

## 2020-11-25 DIAGNOSIS — Z5112 Encounter for antineoplastic immunotherapy: Secondary | ICD-10-CM | POA: Diagnosis not present

## 2020-11-25 DIAGNOSIS — C9000 Multiple myeloma not having achieved remission: Secondary | ICD-10-CM | POA: Insufficient documentation

## 2020-11-25 LAB — CMP (CANCER CENTER ONLY)
ALT: 16 U/L (ref 0–44)
AST: 13 U/L — ABNORMAL LOW (ref 15–41)
Albumin: 3.3 g/dL — ABNORMAL LOW (ref 3.5–5.0)
Alkaline Phosphatase: 68 U/L (ref 38–126)
Anion gap: 11 (ref 5–15)
BUN: 11 mg/dL (ref 8–23)
CO2: 21 mmol/L — ABNORMAL LOW (ref 22–32)
Calcium: 9.7 mg/dL (ref 8.9–10.3)
Chloride: 108 mmol/L (ref 98–111)
Creatinine: 0.9 mg/dL (ref 0.44–1.00)
GFR, Estimated: >60 mL/min (ref >60)
Glucose, Bld: 191 mg/dL — ABNORMAL HIGH (ref 70–99)
Potassium: 3.6 mmol/L (ref 3.5–5.1)
Sodium: 140 mmol/L (ref 135–145)
Total Bilirubin: 0.7 mg/dL (ref 0.3–1.2)
Total Protein: 6.1 g/dL — ABNORMAL LOW (ref 6.5–8.1)

## 2020-11-25 LAB — CBC WITH DIFFERENTIAL/PLATELET
Abs Immature Granulocytes: 0.02 10*3/uL (ref 0.00–0.07)
Basophils Absolute: 0.1 10*3/uL (ref 0.0–0.1)
Basophils Relative: 1 %
Eosinophils Absolute: 0.2 10*3/uL (ref 0.0–0.5)
Eosinophils Relative: 6 %
HCT: 36.7 % (ref 36.0–46.0)
Hemoglobin: 11.9 g/dL — ABNORMAL LOW (ref 12.0–15.0)
Immature Granulocytes: 1 %
Lymphocytes Relative: 25 %
Lymphs Abs: 1 10*3/uL (ref 0.7–4.0)
MCH: 33.1 pg (ref 26.0–34.0)
MCHC: 32.4 g/dL (ref 30.0–36.0)
MCV: 102.2 fL — ABNORMAL HIGH (ref 80.0–100.0)
Monocytes Absolute: 1 10*3/uL (ref 0.1–1.0)
Monocytes Relative: 24 %
Neutro Abs: 1.8 10*3/uL (ref 1.7–7.7)
Neutrophils Relative %: 43 %
Platelets: 125 10*3/uL — ABNORMAL LOW (ref 150–400)
RBC: 3.59 MIL/uL — ABNORMAL LOW (ref 3.87–5.11)
RDW: 13.5 % (ref 11.5–15.5)
WBC: 4 10*3/uL (ref 4.0–10.5)
nRBC: 0 % (ref 0.0–0.2)

## 2020-11-25 MED ORDER — PROCHLORPERAZINE MALEATE 10 MG PO TABS
ORAL_TABLET | ORAL | Status: AC
  Filled 2020-11-25: qty 1

## 2020-11-25 MED ORDER — ZOLEDRONIC ACID 4 MG/100ML IV SOLN
INTRAVENOUS | Status: AC
  Filled 2020-11-25: qty 100

## 2020-11-25 MED ORDER — SODIUM CHLORIDE 0.9 % IV SOLN
Freq: Once | INTRAVENOUS | Status: DC
  Filled 2020-11-25: qty 250

## 2020-11-25 MED ORDER — CARFILZOMIB CHEMO INJECTION 60 MG
56.0000 mg/m2 | Freq: Once | INTRAVENOUS | Status: AC
  Administered 2020-11-25: 13:00:00 100 mg via INTRAVENOUS
  Filled 2020-11-25: qty 30

## 2020-11-25 MED ORDER — SODIUM CHLORIDE 0.9 % IV SOLN
Freq: Once | INTRAVENOUS | Status: AC
  Filled 2020-11-25: qty 250

## 2020-11-25 MED ORDER — SODIUM CHLORIDE 0.9% FLUSH
10.0000 mL | Freq: Once | INTRAVENOUS | Status: AC
  Administered 2020-11-25: 10:00:00 10 mL
  Filled 2020-11-25: qty 10

## 2020-11-25 MED ORDER — FAMOTIDINE 20 MG PO TABS
ORAL_TABLET | ORAL | Status: AC
  Filled 2020-11-25: qty 1

## 2020-11-25 MED ORDER — ACETAMINOPHEN 325 MG PO TABS
650.0000 mg | ORAL_TABLET | Freq: Once | ORAL | Status: AC
  Administered 2020-11-25: 12:00:00 650 mg via ORAL

## 2020-11-25 MED ORDER — DIPHENHYDRAMINE HCL 25 MG PO TABS
25.0000 mg | ORAL_TABLET | Freq: Once | ORAL | Status: AC
  Administered 2020-11-25: 12:00:00 25 mg via ORAL
  Filled 2020-11-25: qty 1

## 2020-11-25 MED ORDER — DEXAMETHASONE 4 MG PO TABS
12.0000 mg | ORAL_TABLET | Freq: Once | ORAL | Status: AC
  Administered 2020-11-25: 12:00:00 12 mg via ORAL

## 2020-11-25 MED ORDER — FAMOTIDINE 20 MG PO TABS
20.0000 mg | ORAL_TABLET | Freq: Once | ORAL | Status: AC
  Administered 2020-11-25: 12:00:00 20 mg via ORAL

## 2020-11-25 MED ORDER — ACETAMINOPHEN 325 MG PO TABS
ORAL_TABLET | ORAL | Status: AC
  Filled 2020-11-25: qty 2

## 2020-11-25 MED ORDER — DIPHENHYDRAMINE HCL 25 MG PO CAPS
ORAL_CAPSULE | ORAL | Status: AC
  Filled 2020-11-25: qty 1

## 2020-11-25 MED ORDER — FAMOTIDINE IN NACL 20-0.9 MG/50ML-% IV SOLN
INTRAVENOUS | Status: AC
  Filled 2020-11-25: qty 50

## 2020-11-25 MED ORDER — ZOLEDRONIC ACID 4 MG/100ML IV SOLN
4.0000 mg | Freq: Once | INTRAVENOUS | Status: AC
  Administered 2020-11-25: 12:00:00 4 mg via INTRAVENOUS

## 2020-11-25 MED ORDER — SODIUM CHLORIDE 0.9% FLUSH
10.0000 mL | INTRAVENOUS | Status: DC | PRN
  Administered 2020-11-25: 13:00:00 10 mL
  Filled 2020-11-25: qty 10

## 2020-11-25 MED ORDER — HEPARIN SOD (PORK) LOCK FLUSH 100 UNIT/ML IV SOLN
500.0000 [IU] | Freq: Once | INTRAVENOUS | Status: DC
  Filled 2020-11-25: qty 5

## 2020-11-25 MED ORDER — HEPARIN SOD (PORK) LOCK FLUSH 100 UNIT/ML IV SOLN
500.0000 [IU] | Freq: Once | INTRAVENOUS | Status: AC | PRN
  Administered 2020-11-25: 13:00:00 500 [IU]
  Filled 2020-11-25: qty 5

## 2020-11-25 MED ORDER — PROCHLORPERAZINE MALEATE 10 MG PO TABS
10.0000 mg | ORAL_TABLET | Freq: Once | ORAL | Status: AC
  Administered 2020-11-25: 12:00:00 10 mg via ORAL

## 2020-11-25 MED ORDER — DEXAMETHASONE 4 MG PO TABS
ORAL_TABLET | ORAL | Status: AC
  Filled 2020-11-25: qty 3

## 2020-11-25 NOTE — Patient Instructions (Signed)

## 2020-11-25 NOTE — Progress Notes (Signed)
HEMATOLOGY/ONCOLOGY CLINIC NOTE  Date of Service: 11/25/2020  Patient Care Team: Hoyt Koch, MD as PCP - General (Internal Medicine) Lafayette Dragon, MD (Inactive) as Consulting Physician (Gastroenterology) Megan Salon, MD as Consulting Physician (Gynecology) Melrose Nakayama, MD as Consulting Physician (Orthopedic Surgery) Deneise Lever, MD as Consulting Physician (Pulmonary Disease) [name], MD (Ophthalmology)  CHIEF COMPLAINTS/PURPOSE OF CONSULTATION:  Continue mx of myeloma  HISTORY OF PRESENTING ILLNESS:   Faith Orr is a wonderful 79 y.o. female who has been referred to [name] by Dr. Pricilla Holm for evaluation and management of Lytic bone lesions. She is accompanied today by her son in law, and her partner is present via phone. The pt reports that she is doing well overall.   The pt notes that 2-3 months ago while standing at the stove, and otherwise feeling normally, she felt "something snap that took her breath away" in her mid back as she stretched to get something. The pt notes that she saw a chiropractor twice due to her back stiffness, which did not help. The pt then described her new bone pains to her PCP on 12/05/18, and subsequent imaging, as noted below, revealed concerns for numerous bone lesions. She has begun 33m Fosamax. The pt notes that she had hip pain in 2018, and that an XR at that time did not reveal any lesions.  The pt notes that most of her pain is concentrated to her left shoulder presently, and with minimal movement of the arm. She endorses pain radiating into her left arm and notes that her hand is swollen in the mornings when she wakes up. She also endorses present back pain and right hip pain, worse when she walks. She has not yet seen orthopedics. The pt reports that she is needing to take 6040mAdvil every 4-6 hours as Tramadol alone has not been able to alleviate her pain. The pt denies any unexpected weight loss,  fevers, chills, or night sweats. The pt notes that her urine is a very dark color presently, but denies overt blood in the urine, underpants, nor tissue paper. The pt notes that in the last two weeks she has had some soreness in her head, but denies new headaches or changes in vision. The pt notes that she has been urinating more frequently overall, but has been trying to stay better hydrated as well.  The pt notes that she has been compliant with annual mammograms.   The pt notes that she had a cyst in her right breast which was removed in the past. She fractured her left wrist in 2008 after falling down stairs. The pt endorses history of fatty liver.   The pt denies ever smoking cigarettes and endorses significant second hand smoke exposure with a previous marriage.   Of note prior to the patient's visit today, pt has had a Bone Scan completed on 12/13/18 with results revealing Multifocal uptake throughout the skeleton, consistent with diffuse metastatic disease. Primary tumor is not specified. 2. Uptake in the proximal right femur, consistent with lytic lesions. 3. Uptake in the ribs bilaterally as described. 4. Lesions in the proximal left humerus. 5. Diffuse uptake throughout the skull consistent with metastatic disease. 6. Right paramedian uptake at the manubrium.  Most recent lab results (12/08/18) of CBC w/diff and CMP is as follows: all values are WNL except for Glucose at 279, BUN at 24, AST at 41, ALT at 46. 12/08/18 SPEP revealed all values WNL except for Total  Protein at 6.0, Albumin at 3.6, Gamma globulin at 0.7, and M spike at 0.5g  On review of systems, pt reports significant left shoulder pain, back pain, right hip pain, dark urine, and denies fevers, chills, night sweats, unexpected weight loss, changes in bowel habits, changes in breathing, cough, new respiratory symptoms, changes in vision, abdominal pains, leg swelling, and any other symptoms.   On PMHx the pt reports fatty liver,  and denies blood clots.  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 Denies ever smoking.  On Family Hx the pt reports maternal grandmother with colon cancer. Father with bladder cancer and amyloidosis (pt notes that this could have been misdiagnosis). Mother with Protein S deficiency and polymyalgia rheumatica.  Current Treatment: Carfilzomib + Revlimid maintenance.  Interval History:  Faith Orr returns today for management and evaluation of her Multiple Myeloma. The patient's last visit with [name] was on 09/30/2020. The pt reports that she is doing well overall.  The pt reports that she had increased fatigue for about a week after her last treatment. Pt has required more antiemetic and pain medications since her last cycle. She notes pain in her lower spine and around the back of her neck. The pain is dull and constant. Her back pain is improved when she lays down. Pt continues taking 60 mEq Potassium daily.  Lab results today (11/25/20) of CBC w/diff and CMP is as follows: all values are WNL except for RBC at 3.59, Hgb at 11.9, MCV at 102.2, PLT at 125K, CO2 at 21, Glucose at 191, Total Protein at 6.1, Albumin at 3.3, AST at 13.  On review of systems, pt reports nausea, low appetite, low back pain, fatigue and denies SOB, chest pain, abdominal pain, diarrhea, leg swelling and any other symptoms.   MEDICAL HISTORY:  Past Medical History:  Diagnosis Date  . Allergy    seasonal  . Asthma   . DEPRESSION   . DIABETES MELLITUS, TYPE II   . Diverticulosis   . HYPERLIPIDEMIA   . Macular degeneration of left eye    mild, Dr.Hecker  . Obesity, unspecified   . Osteoarthritis of both knees   . OSTEOPENIA   . Osteopenia   . URINARY INCONTINENCE     SURGICAL HISTORY: Past Surgical History:  Procedure Laterality Date  . CATARACT EXTRACTION Left 05/24/2018  . CESAREAN SECTION  01/1973  . CYSTOSCOPY/URETEROSCOPY/HOLMIUM LASER/STENT PLACEMENT  Right 09/20/2019   Procedure: CYSTOSCOPY/URETEROSCOPY/HOLMIUM LASER/STENT PLACEMENT;  Surgeon: Lucas Mallow, MD;  Location: [facility];  Service: Urology;  Laterality: Right;  . FRACTURE SURGERY    . IR IMAGING GUIDED PORT INSERTION  02/20/2019  . left wrist surgery  2008   By Dr. Latanya Maudlin  . right ankle  1994    SOCIAL HISTORY: Social History   Socioeconomic History  . Marital status: Married    Spouse name: Not on file  . Number of children: 1  . Years of education: Not on file  . Highest education level: Not on file  Occupational History    Employer: [employer]  Tobacco Use  . Smoking status: Never Smoker  . Smokeless tobacco: Never Used  . Tobacco comment: Lives with partner [name]) and son  Vaping Use  . Vaping Use: Never used  Substance and Sexual Activity  . Alcohol use: No    Alcohol/week: 0.0 standard drinks  . Drug use: No  . Sexual activity: Never  Partners: Female    Birth control/protection: Post-menopausal    Comment: Lives with female partner (annette hicks) and 53 yo son  Other Topics Concern  . Not on file  Social History Narrative  . Not on file   Social Determinants of Health   Financial Resource Strain: Not on file  Food Insecurity: Not on file  Transportation Needs: Not on file  Physical Activity: Not on file  Stress: Not on file  Social Connections: Not on file  Intimate Partner Violence: Not on file    FAMILY HISTORY: Family History  Problem Relation Age of Onset  . Diabetes Father   . Hyperlipidemia Father   . Heart disease Father   . Cancer Father   . Hypertension Father   . Colon cancer Paternal Grandmother 37  . Osteoporosis Mother   . Protein S deficiency Mother   . Hyperlipidemia Mother   . Multiple sclerosis Daughter   . Cancer Other        bladder  . Breast cancer Neg Hx     ALLERGIES:  is allergic to penicillins, aleve [naproxen sodium], and sulfonamide  derivatives.  MEDICATIONS:  Current Outpatient Medications  Medication Sig Dispense Refill  . acyclovir (ZOVIRAX) 400 MG tablet TAKE 1 TABLET(400 MG) BY MOUTH TWICE DAILY 60 tablet 5  . apixaban (ELIQUIS) 2.5 MG TABS tablet Take 1 tablet (2.5 mg total) by mouth 2 (two) times daily. 60 tablet 5  . Blood Glucose Monitoring Suppl (FREESTYLE FREEDOM LITE) W/DEVICE KIT Use to check blood sugars twice a day Dx 250.00 1 each 0  . Calcium Carbonate-Vitamin D (CALCIUM 600+D HIGH POTENCY) 600-400 MG-UNIT per tablet Take 1 tablet by mouth 2 (two) times daily.     . Cetirizine HCl 10 MG CAPS Take 1 capsule (10 mg total) by mouth daily. 30 capsule 1  . dexamethasone (DECADRON) 4 MG tablet Take 5 tablets (20 mg total) by mouth once a week. On D22 of each cycle of treatment 20 tablet 5  . doxycycline (VIBRA-TABS) 100 MG tablet Take 1 tablet (100 mg total) by mouth 2 (two) times daily. (Patient not taking: Reported on 09/11/2020) 14 tablet 1  . fentaNYL (DURAGESIC) 12 MCG/HR Place 1 patch onto the skin every 3 (three) days. 10 patch 0  . fluticasone (FLONASE) 50 MCG/ACT nasal spray Place 1 spray into both nostrils daily. (Patient taking differently: Place 1 spray into both nostrils daily as needed for allergies or rhinitis. ) 16 g 2  . glipiZIDE (GLUCOTROL XL) 5 MG 24 hr tablet TAKE 1 TABLET(5 MG) BY MOUTH DAILY WITH BREAKFAST 90 tablet 1  . glucose blood (FREESTYLE LITE) test strip CHECK BLOOD SUGAR TWICE DAILY AS DIRECTED Dx 250.00 180 each 3  . Lancets (FREESTYLE) lancets Use twice daily to check sugars. 100 each 11  . lenalidomide (REVLIMID) 15 MG capsule TAKE 1 CAPSULE BY MOUTH  DAILY FOR 21 DAYS ON, THEN  7 DAYS OFF 21 capsule 0  . lidocaine-prilocaine (EMLA) cream APPLY 1 APPLICATION TO THE AFFECTED AREA AS NEEDED. USE PRIOR TO PORT ACCESS (Patient not taking: Reported on 09/11/2020) 30 g 0  . LORazepam (ATIVAN) 0.5 MG tablet Take 1 tablet (0.5 mg total) by mouth every 8 (eight) hours as needed for anxiety  (significant essential tremors). 60 tablet 0  . metFORMIN (GLUCOPHAGE-XR) 500 MG 24 hr tablet TAKE 3 TABLETS(1500 MG) BY MOUTH DAILY WITH BREAKFAST 270 tablet 1  . methylPREDNISolone (MEDROL DOSEPAK) 4 MG TBPK tablet As directed (Patient not taking: Reported  on 09/11/2020) 21 tablet 0  . Multiple Vitamins-Minerals (ICAPS) CAPS Take 1 capsule by mouth daily after breakfast.     . ondansetron (ZOFRAN) 8 MG tablet Take 1 tablet (8 mg total) by mouth 2 (two) times daily as needed (Nausea or vomiting). 30 tablet 1  . Oxycodone HCl 10 MG TABS Take 1 tablet (10 mg total) by mouth every 6 (six) hours as needed. 90 tablet 0  . pantoprazole (PROTONIX) 20 MG tablet TAKE 1 TABLET(20 MG) BY MOUTH DAILY 30 tablet 5  . polyethylene glycol (MIRALAX / GLYCOLAX) packet Take 17 g by mouth daily after breakfast.  (Patient not taking: Reported on 09/11/2020)    . potassium chloride SA (KLOR-CON) 20 MEQ tablet TAKE 1 TABLET(20 MEQ) BY MOUTH TWICE DAILY 60 tablet 1  . prochlorperazine (COMPAZINE) 10 MG tablet Take 1 tablet (10 mg total) by mouth every 6 (six) hours as needed (Nausea or vomiting). 30 tablet 1  . senna-docusate (SENNA S) 8.6-50 MG tablet Take 2 tablets by mouth at bedtime. 60 tablet 2  . sertraline (ZOLOFT) 50 MG tablet TAKE 1 TABLET BY MOUTH DAILY 30 tablet 1  . simvastatin (ZOCOR) 20 MG tablet TAKE 1 TABLET BY MOUTH EVERY DAY AT 6 PM 30 tablet 2  . Triamcinolone Acetonide 0.025 % LOTN Apply 1 application topically 3 (three) times daily as needed (rash/itching). 60 mL 2  . triamcinolone cream (KENALOG) 0.5 % Apply 1 application topically 4 (four) times daily. 90 g 1  . Vitamin D, Ergocalciferol, (DRISDOL) 1.25 MG (50000 UNIT) CAPS capsule TAKE 1 CAPSULE BY MOUTH EVERY 7 DAYS 12 capsule 0   No current facility-administered medications for this visit.   Facility-Administered Medications Ordered in Other Visits  Medication Dose Route Frequency Provider Last Rate Last Admin  . heparin lock flush 100 unit/mL   500 Units Intracatheter Once PRN Brunetta Genera, MD      . sodium chloride flush (NS) 0.9 % injection 10 mL  10 mL Intracatheter PRN Brunetta Genera, MD   10 mL at 10/02/19 1524    REVIEW OF SYSTEMS:   A 10+ POINT REVIEW OF SYSTEMS WAS OBTAINED including neurology, dermatology, psychiatry, cardiac, respiratory, lymph, extremities, GI, GU, Musculoskeletal, constitutional, breasts, reproductive, HEENT.  All pertinent positives are noted in the HPI.  All others are negative.     PHYSICAL EXAMINATION: ECOG FS:2 - Symptomatic, <50% confined to bed  Vitals:   11/25/20 1014  BP: 126/66  Pulse: 83  Resp: 18  Temp: (!) 97.5 F (36.4 C)  SpO2: 98%   Wt Readings from Last 3 Encounters:  11/25/20 157 lb 1.6 oz (71.3 kg)  11/04/20 159 lb 8 oz (72.3 kg)  09/30/20 160 lb 4.8 oz (72.7 kg)   Body mass index is 31.73 kg/m.    Exam was given in a chair   GENERAL:alert, in no acute distress and comfortable SKIN: no acute rashes, no significant lesions EYES: conjunctiva are pink and non-injected, sclera anicteric OROPHARYNX: MMM, no exudates, no oropharyngeal erythema or ulceration NECK: supple, no JVD LYMPH:  no palpable lymphadenopathy in the cervical, axillary or inguinal regions LUNGS: clear to auscultation b/l with normal respiratory effort HEART: regular rate & rhythm ABDOMEN:  normoactive bowel sounds , non tender, not distended. No palpable hepatosplenomegaly.  Extremity: no pedal edema PSYCH: alert & oriented x 3 with fluent speech NEURO: no focal motor/sensory deficits  LABORATORY DATA:  I have reviewed the data as listed  . CBC Latest Ref Rng &  Units 11/25/2020 11/04/2020 10/21/2020  WBC 4.0 - 10.5 K/uL 4.0 3.4(L) 2.9(L)  Hemoglobin 12.0 - 15.0 g/dL 11.9(L) 12.3 12.2  Hematocrit 36.0 - 46.0 % 36.7 36.5 36.6  Platelets 150 - 400 K/uL 125(L) 106(L) 139(L)   . CBC    Component Value Date/Time   WBC 4.0 11/25/2020 1000   RBC 3.59 (L) 11/25/2020 1000   HGB 11.9  (L) 11/25/2020 1000   HGB 12.8 02/16/2019 1138   HCT 36.7 11/25/2020 1000   PLT 125 (L) 11/25/2020 1000   PLT 170 02/16/2019 1138   MCV 102.2 (H) 11/25/2020 1000   MCH 33.1 11/25/2020 1000   MCHC 32.4 11/25/2020 1000   RDW 13.5 11/25/2020 1000   LYMPHSABS 1.0 11/25/2020 1000   MONOABS 1.0 11/25/2020 1000   EOSABS 0.2 11/25/2020 1000   BASOSABS 0.1 11/25/2020 1000    . CMP Latest Ref Rng & Units 11/25/2020 11/04/2020 10/21/2020  Glucose 70 - 99 mg/dL 191(H) 208(H) 226(H)  BUN 8 - 23 mg/dL '11 12 11  ' Creatinine 0.44 - 1.00 mg/dL 0.90 1.06(H) 1.05(H)  Sodium 135 - 145 mmol/L 140 139 139  Potassium 3.5 - 5.1 mmol/L 3.6 3.7 3.8  Chloride 98 - 111 mmol/L 108 108 109  CO2 22 - 32 mmol/L 21(L) 22 21(L)  Calcium 8.9 - 10.3 mg/dL 9.7 9.3 9.2  Total Protein 6.5 - 8.1 g/dL 6.1(L) 6.1(L) 6.2(L)  Total Bilirubin 0.3 - 1.2 mg/dL 0.7 0.7 0.7  Alkaline Phos 38 - 126 U/L 68 78 63  AST 15 - 41 U/L 13(L) 10(L) 12(L)  ALT 0 - 44 U/L '16 14 13   ' 09/18/2019 BM Bx Report ([id number])   09/18/2019 FISH Panel    05/30/2019 BM Bx   01/06/2019 BM Bx:     01/06/19 Cytogenetics:      05/30/19 BM Biopsy:   09/18/2019 FISH Panel    09/18/2019 BM Surgical Pathology ([id number])     RADIOGRAPHIC STUDIES: I have personally reviewed the radiological images as listed and agreed with the findings in the report. No results found.  ASSESSMENT & PLAN:   79 y.o. female with  1. Recently diagnosed Multiple Myeloma, RISS Stage III  Labs upon initial presentation from 12/08/18, blood counts are normal including WBC at 7.1k, HGB at 13.1, and PLT at 245k. Calcium normal at 10.3. Creatinine normal at 0.63. M spike at 0.5g. 12/13/18 Bone Scan revealed Multifocal uptake throughout the skeleton, consistent with diffuse metastatic disease. Primary tumor is not specified. 2. Uptake in the proximal right femur, consistent with lytic lesions. 3. Uptake in the ribs bilaterally as described. 4.  Lesions in the proximal left humerus. 5. Diffuse uptake throughout the skull consistent with metastatic disease. 6. Right paramedian uptake at the manubrium.  12/13/18 CT Right Femur revealed Numerous lytic lesions involving the right femur and a lytic lesion in the left inferior pubic ramus. Overall appearance is most concerning for multiple myeloma  12/27/18 Pretreatment 24hour UPEP observed an M spike at 34m, and showed 1984mtotal protein/day.  12/27/18 Pretreatment MMP revealed M Protein at 0.5g with IgG Lambda specificity. Kappa:Lambda light chain ratio at 0.13, with Lambda at 40.3. There is less abnormal protein and light chains than I would expect from 30% plasma cells, which suggests hypo-secretory or non-secretory neoplastic plasma cells. Will have an impact in assessing response. 01/05/19 PET/CT revealed Innumerable lytic lesions in the skeleton compatible with myeloma. Most of the larger lesions are hypermetabolic, for example including a left proximal humeral shaft lesion with  maximum SUV of 8.1 and a 2.8 cm lesion in the left T9 vertebral body with maximum SUV 5.1. Most of the smaller lytic lesions, and some of the larger lesions, do not demonstrate accentuated metabolic activity. 2. 1.2 cm in short axis lymph node in the left parapharyngeal space is hypermetabolic with maximum SUV 11.8. I do not see a separate mass in the head and neck to give rise to this hypermetabolic lymph node. 3. Mosaic attenuation in the lower lobes, nonspecific possibly from air trapping. 4.  Aortic Atherosclerosis 5. Heterogeneous activity in the liver, making it hard to exclude small liver lesions. Consider hepatic protocol MRI with and without contrast for definitive assessment. Nonobstructive right nephrolithiasis. Old granulomatous disease  01/06/19 Bone Marrow biopsy revealed interstitial increase in plasma cells (28% aspirate, 40% CD138 immunohistochemistry). Plasma cells negative for light chains consistent with  a non or weakly secretory myeloma   01/06/19 Cytogenetics revealed 37% of cells with trisomy 11 or 11q deletion, and 40.5% of cells with 17p mutation  S/p 5 cycles of KRD treatment  05/31/19 BM Biopsy revealed mild atypical plasmacytosis at 5% with polytypic variation.   06/01/19 PET/CT revealed "Dominant lesion in the LEFT humerus is decreased significantly in metabolic activity. Additional hypermetabolic skeletal lytic lesions have decreased in metabolic activity or similar to comparison exam (01/05/2019). No evidence of disease progression. 2. Multiple additional lytic lesions do not have metabolic activity and unchanged. 3. No new skeletal lesions are identified. No soft tissue plasmacytoma identified. 4. Nodule / node in the LEFT parapharyngeal space which is intensely hypermetabolic not changed from prior. 5. New hypermetabolic LEFT lower lobe pulmonary nodule is indeterminate. Recommend close attention on follow-up 6. New obstructive hydronephrosis of the RIGHT kidney related to RIGHT UPJ stone."  09/18/2019 BM Bx Report which revealed "Slightly hypercellular bone marrow for age with trilineage hematopoiesis and 1% plasma cells."  09/14/2019 PET/CT Whole Body Scan ([id number]) which revealed "1. There widespread tiny lytic lesions compatible with multiple myeloma. Index larger lesions are generally similar to the prior exam, with low-grade activity such as the left T9 vertebral body lesion with maximum SUV 4.5. Is mild increase in the activity associated with a mildly sclerotic left proximal humeral lesion, maximum SUV 4.8 (previously 3.5). 2. At the site of the prior left lower lobe nodule is currently more bandlike thickening, with maximum SUV only 1.9, probably benign, continued surveillance of this region suggested. 3. There several small but hypermetabolic lymph nodes. This includes a left parapharyngeal space node measuring 1.0 cm with maximum SUV 12.3 (stable); a left level IB lymph node  measuring 0.5 cm with maximum SUV 4.8 (slightly larger than prior); and a left inguinal lymph node measuring 0.7 cm in short axis with maximum SUV 6.4 (previously 0.5 cm with maximum SUV 0.6). Significance of these lymph nodes uncertain, surveillance is recommended. 4. New 5 mm left lower lobe subpleural nodule on image 32/8, not appreciably hypermetabolic, surveillance suggested. 5. Focal subcutaneous stranding along the left perineum measuring about 2.6 by 1.1 cm on image 221/4, maximum SUV 12.5. This was not present previously and is most likely inflammatory, although given the notable SUV, surveillance of this region is suggested. 6. Other imaging findings of potential clinical significance: Aortic Atherosclerosis (ICD10-I70.0). Coronary atherosclerosis. Old granulomatous disease. Mild right hydronephrosis due to a 7 mm right UPJ calculus. 2 mm right kidney upper pole nonobstructive renal calculus. Prominent stool throughout the colon favors constipation."  12/19/2019 Thoracic & Lumbar Spine MRI ([id number]) ([id number]) revealed "Suspected  myeloma lesions at T9 and S1. No compression deformity or epidural disease.  01/18/2020 PET/CT ([id number]) which revealed "1. Stable lytic lesions throughout the skeleton. The larger lytic lesions which had mild metabolic activity on comparison exam now have background metabolic activity. No evidence of active myeloma. No evidence of progression multiple myeloma.  No plasmacytoma 3. Hypermetabolic nodules in the LEFT neck may be associated deep tissues of the LEFT parotid gland. 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  01/17/19 MRI Liver revealed Several appreciable liver lesions all have benign imaging characteristics. No MRI findings of metastatic involvement of the  liver. 2. Scattered bony lesions corresponding to the lytic lesions seen at PET-CT, compatible with active myeloma. 3. Aortic Atherosclerosis.  Mild cardiomegaly. 4. Diffuse hepatic steatosis.   3. Left lower lobe pulmonary nodule First seen on 06/01/19 PET/CT  PLAN: -Discussed pt labwork today, 11/25/20; blood counts look normal, Glucose is upper limits of nml, other blood chemistries are okay. -No lab or clinical evidence of Multiple Myeloma progression at this time. -The pt has no prohibitive toxicities from continuing C22D15 Carfilzomib at this time. -Will continue Carflizomib at 56 mg/m^2q 2weeks for maintenance. Will consider changing if fatigue becomes limiting.  -Continue 15 mg Revlimid 3 weeks on, 1 week off for maintenance. No prohibitive toxicities at this time.  -Advised pt that there is some element of cumulative fatigue with subsequent treatments.  -If fatigue is still limiting after this cycle and repeat scans and BM Bx are stable will consider lowering dose of Revlimid and/or Carflizomib. -Recommended that the pt continue to eat well, drink at least 48-64 oz of water each day, and walk 20-30 minutes each day.  -Bone pain not likely from Myeloma as last scan was negative for bone lesions three months ago.  -Recommend pt continue 2.5 Eliquis BID. -Continue Potassium daily  -Continue Zometa q4weeks -Will see back in 4 weeks with labs   FOLLOW UP: Plz schedule next 2 cycles (4 doses of Carfilzomib) with portflush and labs with each treatment Continue Zometa every 4 weeksx6 MD visit in 4 weeks   The total time spent in the appt was 30 minutes and more than 50% was on counseling and direct patient cares, ordering and management of chemotherapy   All of the patient's questions were answered with apparent satisfaction. The patient knows to call the clinic with any problems, questions or concerns.    Sullivan Lone MD Dustin AAHIVMS [facility] Hematology/Oncology Physician [facility]  (Office):       [phone] (Work cell):  [phone] (Fax):           [phone]  11/25/2020 11:11 AM  I, Yevette Edwards, am acting as a scribe for Dr. Sullivan Lone.  .I have reviewed the above documentation for accuracy and completeness, and I agree with the above. Brunetta Genera MD

## 2020-11-25 NOTE — Patient Instructions (Signed)
[facility] Discharge Instructions for Patients Receiving Chemotherapy  Today you received the following chemotherapy agents Kyprolis  To help prevent nausea and vomiting after your treatment, we encourage you to take your nausea medication as directed.    If you develop nausea and vomiting that is not controlled by your nausea medication, call the clinic.   BELOW ARE SYMPTOMS THAT SHOULD BE REPORTED IMMEDIATELY:  *FEVER GREATER THAN 100.5 F  *CHILLS WITH OR WITHOUT FEVER  NAUSEA AND VOMITING THAT IS NOT CONTROLLED WITH YOUR NAUSEA MEDICATION  *UNUSUAL SHORTNESS OF BREATH  *UNUSUAL BRUISING OR BLEEDING  TENDERNESS IN MOUTH AND THROAT WITH OR WITHOUT PRESENCE OF ULCERS  *URINARY PROBLEMS  *BOWEL PROBLEMS  UNUSUAL RASH Items with * indicate a potential emergency and should be followed up as soon as possible.  Feel free to call the clinic should you have any questions or concerns. The clinic phone number is [phone].  Please show the [facility] at check-in to the Emergency Department and triage nurse.  Zoledronic Acid injection (Hypercalcemia, Oncology) What is this medicine? ZOLEDRONIC ACID (ZOE le dron ik AS id) lowers the amount of calcium loss from bone. It is used to treat too much calcium in your blood from cancer. It is also used to prevent complications of cancer that has spread to the bone. This medicine may be used for other purposes; ask your health care provider or pharmacist if you have questions. COMMON BRAND NAME(S): Zometa What should I tell my health care provider before I take this medicine? They need to know if you have any of these conditions:  aspirin-sensitive asthma  cancer, especially if you are receiving medicines used to treat cancer  dental disease or wear dentures  infection  kidney disease  receiving corticosteroids like dexamethasone or prednisone  an unusual or allergic reaction to zoledronic acid, other  medicines, foods, dyes, or preservatives  pregnant or trying to get pregnant  breast-feeding How should I use this medicine? This medicine is for infusion into a vein. It is given by a health care professional in a hospital or clinic setting. Talk to your pediatrician regarding the use of this medicine in children. Special care may be needed. Overdosage: If you think you have taken too much of this medicine contact a poison control center or emergency room at once. NOTE: This medicine is only for you. Do not share this medicine with others. What if I miss a dose? It is important not to miss your dose. Call your doctor or health care professional if you are unable to keep an appointment. What may interact with this medicine?  certain antibiotics given by injection  NSAIDs, medicines for pain and inflammation, like ibuprofen or naproxen  some diuretics like bumetanide, furosemide  teriparatide  thalidomide This list may not describe all possible interactions. Give your health care provider a list of all the medicines, herbs, non-prescription drugs, or dietary supplements you use. Also tell them if you smoke, drink alcohol, or use illegal drugs. Some items may interact with your medicine. What should I watch for while using this medicine? Visit your doctor or health care professional for regular checkups. It may be some time before you see the benefit from this medicine. Do not stop taking your medicine unless your doctor tells you to. Your doctor may order blood tests or other tests to see how you are doing. Women should inform their doctor if they wish to become pregnant or think they might be  pregnant. There is a potential for serious side effects to an unborn child. Talk to your health care professional or pharmacist for more information. You should make sure that you get enough calcium and vitamin D while you are taking this medicine. Discuss the foods you eat and the vitamins you take  with your health care professional. Some people who take this medicine have severe bone, joint, and/or muscle pain. This medicine may also increase your risk for jaw problems or a broken thigh bone. Tell your doctor right away if you have severe pain in your jaw, bones, joints, or muscles. Tell your doctor if you have any pain that does not go away or that gets worse. Tell your dentist and dental surgeon that you are taking this medicine. You should not have major dental surgery while on this medicine. See your dentist to have a dental exam and fix any dental problems before starting this medicine. Take good care of your teeth while on this medicine. Make sure you see your dentist for regular follow-up appointments. What side effects may I notice from receiving this medicine? Side effects that you should report to your doctor or health care professional as soon as possible:  allergic reactions like skin rash, itching or hives, swelling of the face, lips, or tongue  anxiety, confusion, or depression  breathing problems  changes in vision  eye pain  feeling faint or lightheaded, falls  jaw pain, especially after dental work  mouth sores  muscle cramps, stiffness, or weakness  redness, blistering, peeling or loosening of the skin, including inside the mouth  trouble passing urine or change in the amount of urine Side effects that usually do not require medical attention (report to your doctor or health care professional if they continue or are bothersome):  bone, joint, or muscle pain  constipation  diarrhea  fever  hair loss  irritation at site where injected  loss of appetite  nausea, vomiting  stomach upset  trouble sleeping  trouble swallowing  weak or tired This list may not describe all possible side effects. Call your doctor for medical advice about side effects. You may report side effects to FDA at 1-800-FDA-1088. Where should I keep my medicine? This drug  is given in a hospital or clinic and will not be stored at home. NOTE: This sheet is a summary. It may not cover all possible information. If you have questions about this medicine, talk to your doctor, pharmacist, or health care provider.  2020 Elsevier/Gold Standard (2014-04-28 14:19:39)

## 2020-11-25 NOTE — Progress Notes (Signed)
Pt stable and ambulatory at time of discharge 

## 2020-11-26 MED ORDER — SERTRALINE HCL 50 MG PO TABS: 50.0000 mg | ORAL_TABLET | Freq: Every day | ORAL | 3 refills | Status: DC

## 2020-11-28 ENCOUNTER — Other Ambulatory Visit: Payer: Self-pay | Admitting: *Deleted

## 2020-11-28 DIAGNOSIS — C9000 Multiple myeloma not having achieved remission: Secondary | ICD-10-CM

## 2020-11-28 MED ORDER — LENALIDOMIDE 15 MG PO CAPS: ORAL_CAPSULE | ORAL | 0 refills | Status: DC

## 2020-11-28 NOTE — Telephone Encounter (Signed)
Received faxed notification from [facility] that [facility] needed a new RX with [facility]#. Auth# [id number], 11/28/20 New RX escribed to [facility]

## 2020-12-04 ENCOUNTER — Telehealth: Payer: Self-pay | Admitting: Hematology

## 2020-12-04 NOTE — Telephone Encounter (Signed)
Scheduled per los, patient has been called and notified of upcoming appointments. 

## 2020-12-09 ENCOUNTER — Inpatient Hospital Stay: Payer: Medicare PPO

## 2020-12-09 ENCOUNTER — Other Ambulatory Visit: Payer: Self-pay

## 2020-12-09 VITALS — BP 127/70 | HR 81 | Temp 98.7°F | Wt 158.8 lb

## 2020-12-09 DIAGNOSIS — C9000 Multiple myeloma not having achieved remission: Secondary | ICD-10-CM | POA: Diagnosis not present

## 2020-12-09 DIAGNOSIS — Z7189 Other specified counseling: Secondary | ICD-10-CM

## 2020-12-09 DIAGNOSIS — Z5112 Encounter for antineoplastic immunotherapy: Secondary | ICD-10-CM | POA: Diagnosis not present

## 2020-12-09 DIAGNOSIS — Z95828 Presence of other vascular implants and grafts: Secondary | ICD-10-CM

## 2020-12-09 LAB — CMP (CANCER CENTER ONLY)
ALT: 19 U/L (ref 0–44)
AST: 12 U/L — ABNORMAL LOW (ref 15–41)
Albumin: 3.2 g/dL — ABNORMAL LOW (ref 3.5–5.0)
Alkaline Phosphatase: 83 U/L (ref 38–126)
Anion gap: 10 (ref 5–15)
BUN: 14 mg/dL (ref 8–23)
CO2: 19 mmol/L — ABNORMAL LOW (ref 22–32)
Calcium: 9.2 mg/dL (ref 8.9–10.3)
Chloride: 110 mmol/L (ref 98–111)
Creatinine: 1.19 mg/dL — ABNORMAL HIGH (ref 0.44–1.00)
GFR, Estimated: 47 mL/min — ABNORMAL LOW (ref >60)
Glucose, Bld: 214 mg/dL — ABNORMAL HIGH (ref 70–99)
Potassium: 3.7 mmol/L (ref 3.5–5.1)
Sodium: 139 mmol/L (ref 135–145)
Total Bilirubin: 0.5 mg/dL (ref 0.3–1.2)
Total Protein: 6.3 g/dL — ABNORMAL LOW (ref 6.5–8.1)

## 2020-12-09 LAB — CBC WITH DIFFERENTIAL/PLATELET
Abs Immature Granulocytes: 0.01 10*3/uL (ref 0.00–0.07)
Basophils Absolute: 0.1 10*3/uL (ref 0.0–0.1)
Basophils Relative: 1 %
Eosinophils Absolute: 0.1 10*3/uL (ref 0.0–0.5)
Eosinophils Relative: 3 %
HCT: 37.2 % (ref 36.0–46.0)
Hemoglobin: 12.3 g/dL (ref 12.0–15.0)
Immature Granulocytes: 0 %
Lymphocytes Relative: 29 %
Lymphs Abs: 1.3 10*3/uL (ref 0.7–4.0)
MCH: 34.1 pg — ABNORMAL HIGH (ref 26.0–34.0)
MCHC: 33.1 g/dL (ref 30.0–36.0)
MCV: 103 fL — ABNORMAL HIGH (ref 80.0–100.0)
Monocytes Absolute: 0.7 10*3/uL (ref 0.1–1.0)
Monocytes Relative: 15 %
Neutro Abs: 2.4 10*3/uL (ref 1.7–7.7)
Neutrophils Relative %: 52 %
Platelets: 148 10*3/uL — ABNORMAL LOW (ref 150–400)
RBC: 3.61 MIL/uL — ABNORMAL LOW (ref 3.87–5.11)
RDW: 14.1 % (ref 11.5–15.5)
WBC: 4.6 10*3/uL (ref 4.0–10.5)
nRBC: 0 % (ref 0.0–0.2)

## 2020-12-09 IMAGING — CT NM PET TUM IMG INITIAL (PI) WHOLE BODY
1 of 3 series · 17 of 25 positions shown · non-contrast
Comparison: Bone scan of 12/13/2018

CLINICAL DATA: Initial treatment strategy for multiple myeloma.

EXAM:
NUCLEAR MEDICINE PET WHOLE BODY
TECHNIQUE: 8.9 mCi F-18 FDG was injected intravenously. Full-ring PET imaging
was performed from the skull base to thigh after the radiotracer. CT
data was obtained and used for attenuation correction and anatomic
localization.
Fasting blood glucose: 162 mg/dl

[Series 4: ct wb 5.0 hd_fov · axial · 5.0mm · 1.22mm/px · z∈[-202,+1266]mm · 17 of 409 slices shown]
[im 21/409  soft-tissue]
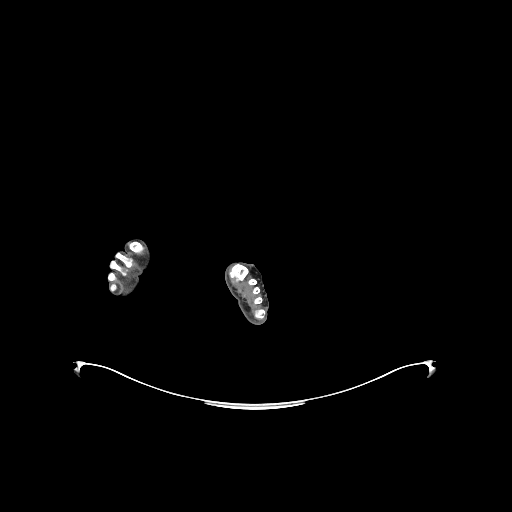
[im 41/409  soft-tissue]
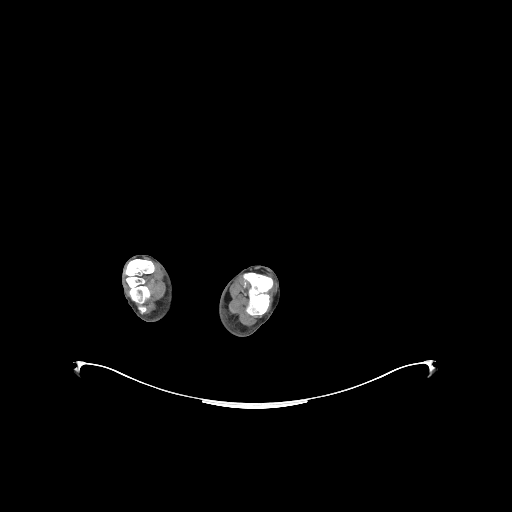
[im 62/409  soft-tissue]
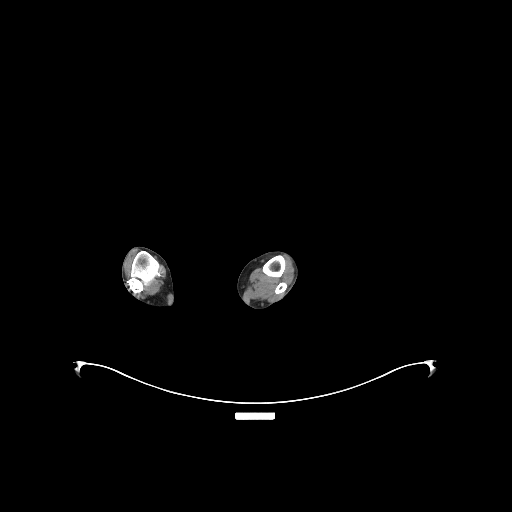
[im 82/409  soft-tissue]
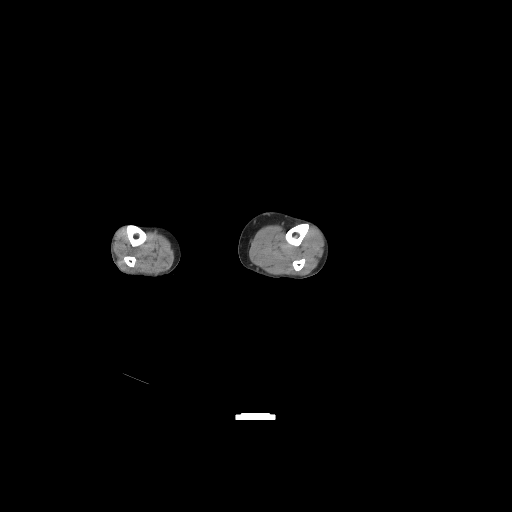
[im 123/409  soft-tissue]
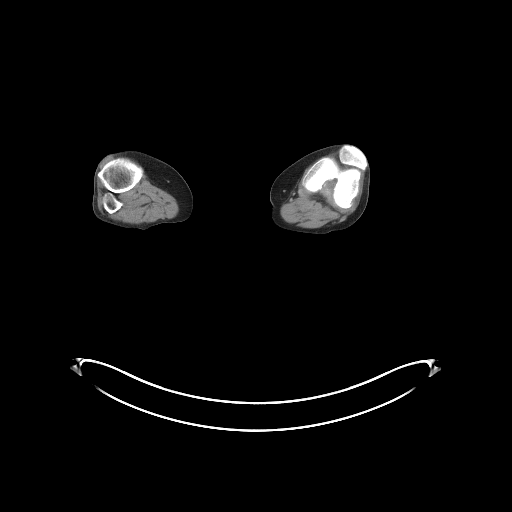
[im 143/409  soft-tissue]
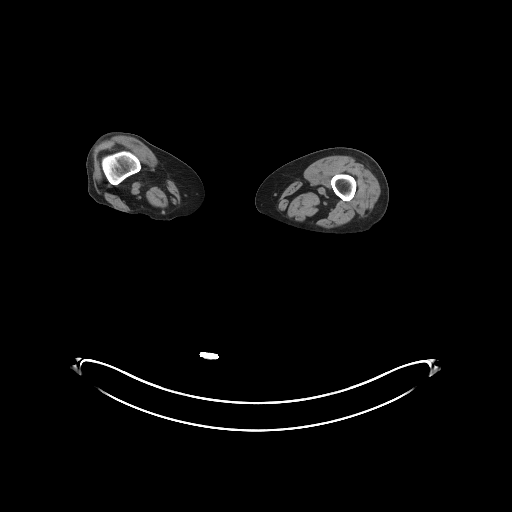
[im 164/409  soft-tissue]
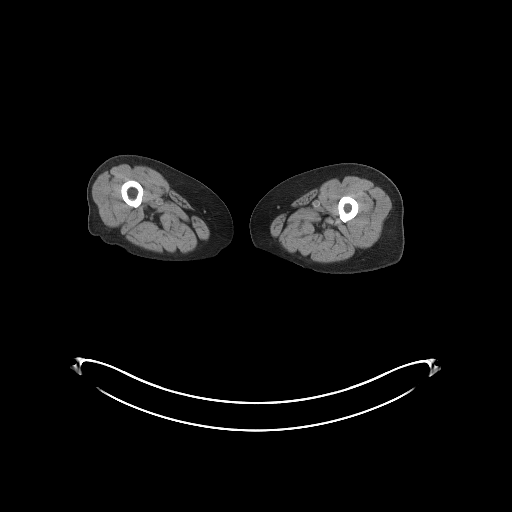
[im 184/409  soft-tissue]
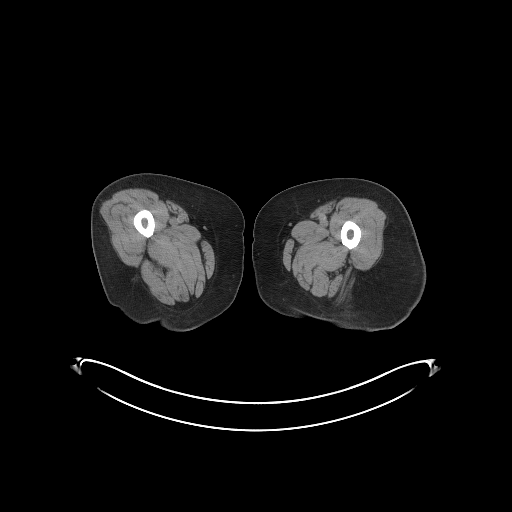
[im 205/409  soft-tissue]
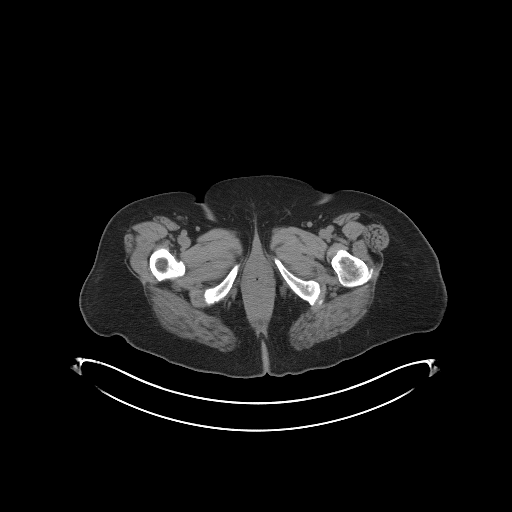
[im 225/409  soft-tissue]
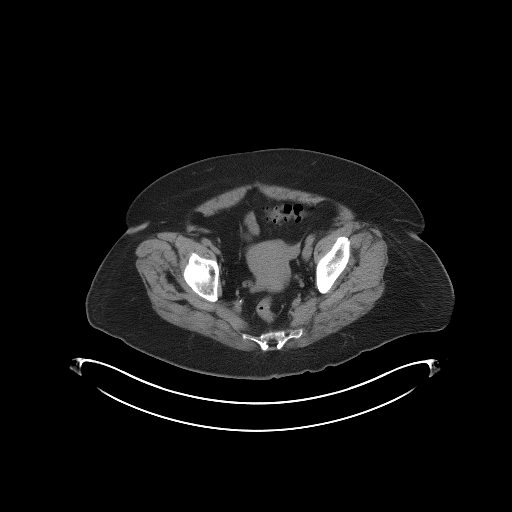
[im 245/409  soft-tissue]
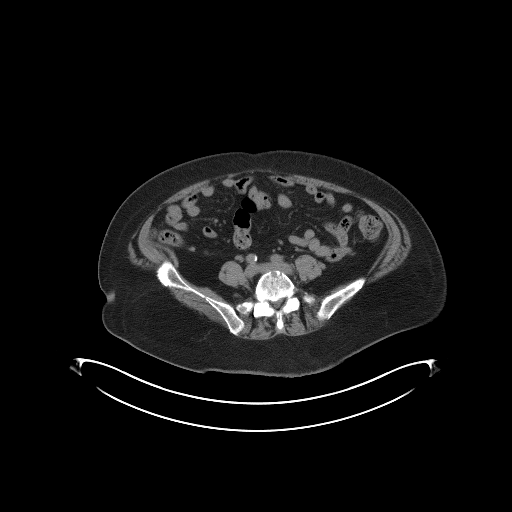
[im 266/409  soft-tissue]
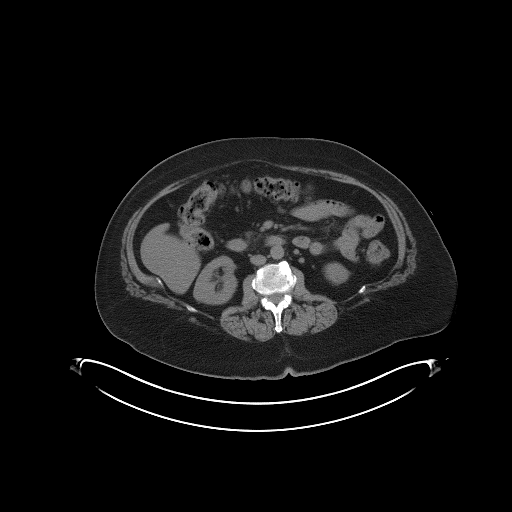
[im 286/409  soft-tissue]
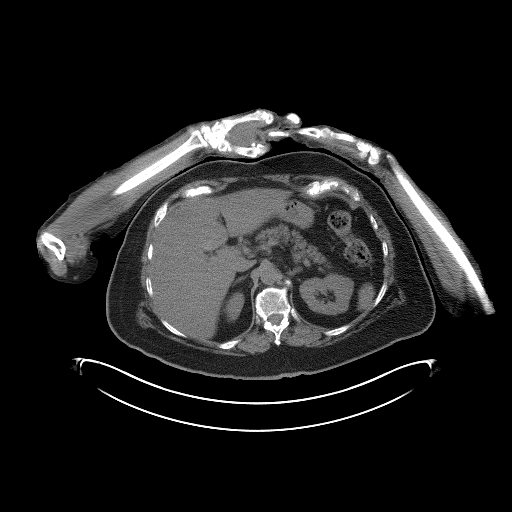
[im 327/409  soft-tissue]
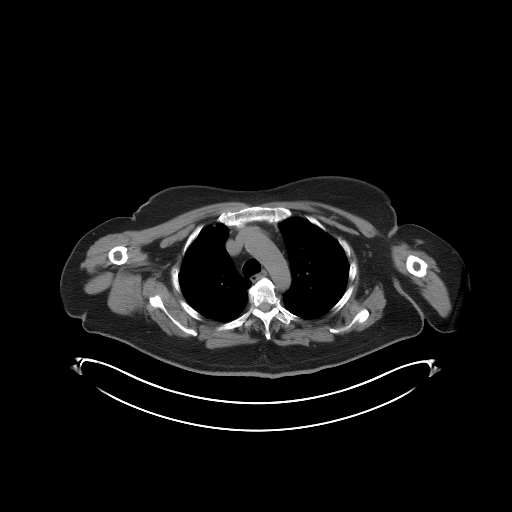
[im 347/409  soft-tissue]
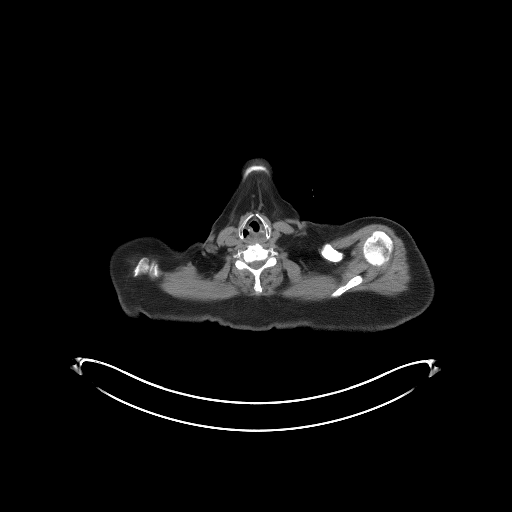
[im 368/409  soft-tissue]
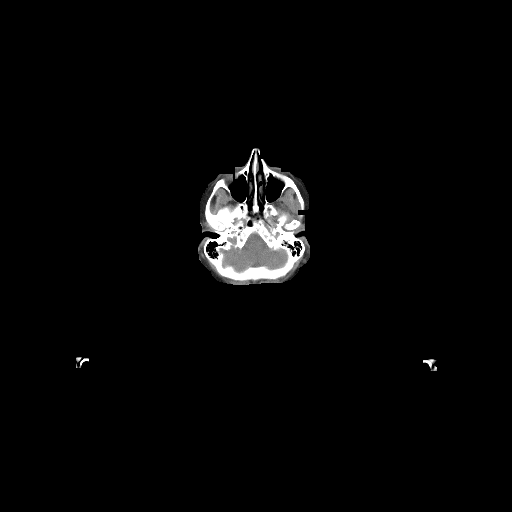
[im 388/409  soft-tissue]
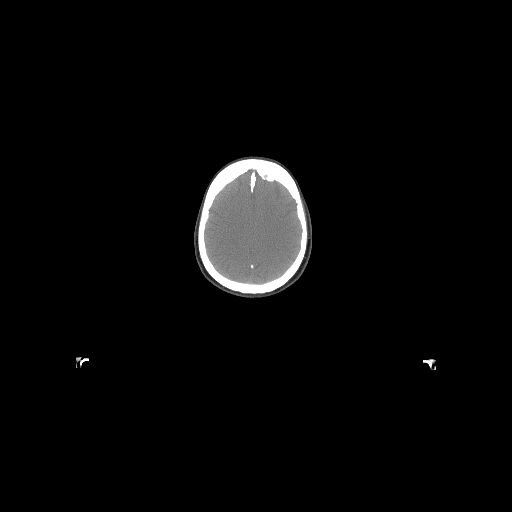

[17 of 25 positions shown; findings below may reference images not displayed]

FINDINGS: Mediastinal blood pool activity: SUV max

HEAD/NECK: A lymph node measuring 1.2 cm in short axis in the left
parapharyngeal space on image 47/4 has a maximum SUV 11.8.

Incidental CT findings: none

CHEST: No significant abnormal hypermetabolic activity in this
region.

Incidental CT findings: Mild mosaic attenuation particularly
favoring the lower lobes. Old granulomatous disease. Aortic
atherosclerosis.

ABDOMEN/PELVIS: Accentuated heterogeneity of activity in the liver.
One area of heterogeneity posteriorly in the right hepatic lobe has
a maximum SUV of 5.1 as compared to the general background liver
activity with maximum SUV of 4.1. A hypodense lesion posteriorly in
the dome of the right hepatic lobe on image 105/4 is not discernibly
hypermetabolic. Similarly there is some mild heterogeneity of the
splenic activity.

Physiologic activity throughout the bowel.

Incidental CT findings: Old granulomatous disease involving the
spleen. 4 mm nonobstructive right mid kidney calculus in the
collecting system. Aortoiliac atherosclerotic vascular disease.
Scattered sigmoid colon diverticula.

SKELETON: Innumerable scattered small lytic lesions are present
throughout the skeleton. Some of these are more confluent and
measurably hypermetabolic, for example the larger lytic lesion of
the left proximal humerus which has a maximum SUV of 8.1. There is a
likely pathologic fracture of the right third rib with maximum SUV
3.7. Likely pathologic fracture of the right fifth rib with maximum
SUV 3.5. Likely pathologic fracture of the left ninth rib
posterolaterally.

A 2.8 by 1.9 cm lytic lesion of the left side of the T9 vertebral
body has some cortical destruction on image 107/4, and a maximum SUV
of 5.1.

An index lesion posteriorly in the right proximal femoral shaft
measures 2.0 cm in diameter on image 216/4 with maximum SUV 4.0.

Some of the lytic lesions, such as the 1.8 cm lytic lesion of the
left inferior pubis and inferior pubic ramus shown on image 200/4,
are not appreciably hypermetabolic.

Incidental CT findings: Postoperative findings in the right ankle
with K-wires along the malleoli.

EXTREMITIES: No significant abnormal hypermetabolic activity in this
region.

Incidental CT findings: none
IMPRESSION: 1. Innumerable lytic lesions in the skeleton compatible with
myeloma. Most of the larger lesions are hypermetabolic, for example
including a left proximal humeral shaft lesion with maximum SUV of
8.1 and a 2.8 cm lesion in the left T9 vertebral body with maximum
SUV 5.1. Most of the smaller lytic lesions, and some of the larger
lesions, do not demonstrate accentuated metabolic activity.
2. 1.2 cm in short axis lymph node in the left parapharyngeal space
is hypermetabolic with maximum SUV 11.8. I do not see a separate
mass in the head and neck to give rise to this hypermetabolic lymph
node.
3. Mosaic attenuation in the lower lobes, nonspecific possibly from
air trapping.
4.  Aortic Atherosclerosis ([id number]).
5. Heterogeneous activity in the liver, making it hard to exclude
small liver lesions. Consider hepatic protocol MRI with and without
contrast for definitive assessment. Nonobstructive right
nephrolithiasis. Old granulomatous disease.

## 2020-12-09 MED ORDER — DEXAMETHASONE 4 MG PO TABS
12.0000 mg | ORAL_TABLET | Freq: Once | ORAL | Status: AC
  Administered 2020-12-09: 11:00:00 12 mg via ORAL

## 2020-12-09 MED ORDER — DIPHENHYDRAMINE HCL 25 MG PO CAPS
25.0000 mg | ORAL_CAPSULE | Freq: Once | ORAL | Status: AC
  Administered 2020-12-09: 11:00:00 25 mg via ORAL

## 2020-12-09 MED ORDER — ACETAMINOPHEN 325 MG PO TABS
650.0000 mg | ORAL_TABLET | Freq: Once | ORAL | Status: AC
  Administered 2020-12-09: 11:00:00 650 mg via ORAL

## 2020-12-09 MED ORDER — FAMOTIDINE 20 MG PO TABS
ORAL_TABLET | ORAL | Status: AC
  Filled 2020-12-09: qty 1

## 2020-12-09 MED ORDER — SODIUM CHLORIDE 0.9 % IV SOLN
Freq: Once | INTRAVENOUS | Status: AC
  Filled 2020-12-09: qty 250

## 2020-12-09 MED ORDER — PROCHLORPERAZINE MALEATE 10 MG PO TABS
10.0000 mg | ORAL_TABLET | Freq: Once | ORAL | Status: AC
  Administered 2020-12-09: 11:00:00 10 mg via ORAL

## 2020-12-09 MED ORDER — DEXAMETHASONE 4 MG PO TABS
ORAL_TABLET | ORAL | Status: AC
  Filled 2020-12-09: qty 2

## 2020-12-09 MED ORDER — ACETAMINOPHEN 325 MG PO TABS
ORAL_TABLET | ORAL | Status: AC
  Filled 2020-12-09: qty 2

## 2020-12-09 MED ORDER — DIPHENHYDRAMINE HCL 25 MG PO CAPS
ORAL_CAPSULE | ORAL | Status: AC
  Filled 2020-12-09: qty 1

## 2020-12-09 MED ORDER — DEXAMETHASONE 4 MG PO TABS
ORAL_TABLET | ORAL | Status: AC
  Filled 2020-12-09: qty 1

## 2020-12-09 MED ORDER — HEPARIN SOD (PORK) LOCK FLUSH 100 UNIT/ML IV SOLN
500.0000 [IU] | Freq: Once | INTRAVENOUS | Status: AC | PRN
  Administered 2020-12-09: 12:00:00 500 [IU]
  Filled 2020-12-09: qty 5

## 2020-12-09 MED ORDER — SODIUM CHLORIDE 0.9% FLUSH
10.0000 mL | Freq: Once | INTRAVENOUS | Status: AC
  Administered 2020-12-09: 10:00:00 10 mL
  Filled 2020-12-09: qty 10

## 2020-12-09 MED ORDER — DEXTROSE 5 % IV SOLN
56.0000 mg/m2 | Freq: Once | INTRAVENOUS | Status: AC
  Administered 2020-12-09: 12:00:00 100 mg via INTRAVENOUS
  Filled 2020-12-09: qty 30

## 2020-12-09 MED ORDER — PROCHLORPERAZINE MALEATE 10 MG PO TABS
ORAL_TABLET | ORAL | Status: AC
  Filled 2020-12-09: qty 1

## 2020-12-09 MED ORDER — FAMOTIDINE 20 MG PO TABS
20.0000 mg | ORAL_TABLET | Freq: Once | ORAL | Status: AC
  Administered 2020-12-09: 11:00:00 20 mg via ORAL

## 2020-12-09 MED ORDER — SODIUM CHLORIDE 0.9% FLUSH
10.0000 mL | INTRAVENOUS | Status: DC | PRN
  Administered 2020-12-09: 12:00:00 10 mL
  Filled 2020-12-09: qty 10

## 2020-12-09 MED ORDER — SODIUM CHLORIDE 0.9 % IV SOLN
Freq: Once | INTRAVENOUS | Status: DC
  Filled 2020-12-09: qty 250

## 2020-12-09 NOTE — Patient Instructions (Signed)
[facility] Discharge Instructions for Patients Receiving Chemotherapy  Today you received the following chemotherapy agents:  Kyprolis  To help prevent nausea and vomiting after your treatment, we encourage you to take your nausea medication as directed.   If you develop nausea and vomiting that is not controlled by your nausea medication, call the clinic.   BELOW ARE SYMPTOMS THAT SHOULD BE REPORTED IMMEDIATELY:  *FEVER GREATER THAN 100.5 F  *CHILLS WITH OR WITHOUT FEVER  NAUSEA AND VOMITING THAT IS NOT CONTROLLED WITH YOUR NAUSEA MEDICATION  *UNUSUAL SHORTNESS OF BREATH  *UNUSUAL BRUISING OR BLEEDING  TENDERNESS IN MOUTH AND THROAT WITH OR WITHOUT PRESENCE OF ULCERS  *URINARY PROBLEMS  *BOWEL PROBLEMS  UNUSUAL RASH Items with * indicate a potential emergency and should be followed up as soon as possible.  Feel free to call the clinic should you have any questions or concerns. The clinic phone number is [phone].  Please show the CHEMO ALERT CARD at check-in to the Emergency Department and triage nurse.   

## 2020-12-10 ENCOUNTER — Encounter: Payer: Self-pay | Admitting: Internal Medicine

## 2020-12-10 ENCOUNTER — Other Ambulatory Visit: Payer: Self-pay | Admitting: Hematology

## 2020-12-11 ENCOUNTER — Other Ambulatory Visit: Payer: Self-pay | Admitting: Hematology

## 2020-12-11 ENCOUNTER — Encounter: Payer: Self-pay | Admitting: Internal Medicine

## 2020-12-11 ENCOUNTER — Encounter: Payer: Self-pay | Admitting: Hematology

## 2020-12-11 MED ORDER — PANTOPRAZOLE SODIUM 20 MG PO TBEC: DELAYED_RELEASE_TABLET | ORAL | 5 refills | Status: DC

## 2020-12-11 MED ORDER — FLUTICASONE PROPIONATE 50 MCG/ACT NA SUSP: 1.0000 | Freq: Every day | NASAL | 2 refills | Status: DC

## 2020-12-19 ENCOUNTER — Encounter: Payer: Self-pay | Admitting: Hematology

## 2020-12-19 NOTE — Progress Notes (Signed)
Called patient to obtain information regarding re-enrollment for copay assistance for [facility] for 2022.  Patient provided information.  Applied online on her behalf. Pending decision.

## 2020-12-20 ENCOUNTER — Other Ambulatory Visit: Payer: Self-pay | Admitting: Internal Medicine

## 2020-12-23 ENCOUNTER — Inpatient Hospital Stay: Payer: Medicare PPO

## 2020-12-23 ENCOUNTER — Inpatient Hospital Stay: Payer: Medicare PPO | Attending: Hematology

## 2020-12-23 ENCOUNTER — Ambulatory Visit: Payer: Medicare PPO

## 2020-12-23 ENCOUNTER — Other Ambulatory Visit: Payer: Medicare PPO

## 2020-12-23 ENCOUNTER — Inpatient Hospital Stay: Payer: Medicare PPO | Admitting: Hematology

## 2020-12-23 ENCOUNTER — Other Ambulatory Visit: Payer: Self-pay

## 2020-12-23 ENCOUNTER — Encounter: Payer: Self-pay | Admitting: Hematology

## 2020-12-23 VITALS — BP 136/71 | HR 84 | Temp 98.1°F | Resp 18 | Ht 59.0 in | Wt 157.8 lb

## 2020-12-23 DIAGNOSIS — M545 Low back pain, unspecified: Secondary | ICD-10-CM

## 2020-12-23 DIAGNOSIS — Z7189 Other specified counseling: Secondary | ICD-10-CM

## 2020-12-23 DIAGNOSIS — Z5111 Encounter for antineoplastic chemotherapy: Secondary | ICD-10-CM | POA: Diagnosis not present

## 2020-12-23 DIAGNOSIS — Z79899 Other long term (current) drug therapy: Secondary | ICD-10-CM | POA: Insufficient documentation

## 2020-12-23 DIAGNOSIS — C9000 Multiple myeloma not having achieved remission: Secondary | ICD-10-CM | POA: Insufficient documentation

## 2020-12-23 DIAGNOSIS — Z95828 Presence of other vascular implants and grafts: Secondary | ICD-10-CM

## 2020-12-23 DIAGNOSIS — Z5112 Encounter for antineoplastic immunotherapy: Secondary | ICD-10-CM | POA: Insufficient documentation

## 2020-12-23 LAB — CBC WITH DIFFERENTIAL/PLATELET
Abs Immature Granulocytes: 0.02 10*3/uL (ref 0.00–0.07)
Basophils Absolute: 0.1 10*3/uL (ref 0.0–0.1)
Basophils Relative: 2 %
Eosinophils Absolute: 0.2 10*3/uL (ref 0.0–0.5)
Eosinophils Relative: 5 %
HCT: 36 % (ref 36.0–46.0)
Hemoglobin: 12.1 g/dL (ref 12.0–15.0)
Immature Granulocytes: 1 %
Lymphocytes Relative: 20 %
Lymphs Abs: 0.9 10*3/uL (ref 0.7–4.0)
MCH: 34 pg (ref 26.0–34.0)
MCHC: 33.6 g/dL (ref 30.0–36.0)
MCV: 101.1 fL — ABNORMAL HIGH (ref 80.0–100.0)
Monocytes Absolute: 1 10*3/uL (ref 0.1–1.0)
Monocytes Relative: 22 %
Neutro Abs: 2.3 10*3/uL (ref 1.7–7.7)
Neutrophils Relative %: 50 %
Platelets: 110 10*3/uL — ABNORMAL LOW (ref 150–400)
RBC: 3.56 MIL/uL — ABNORMAL LOW (ref 3.87–5.11)
RDW: 13.7 % (ref 11.5–15.5)
WBC: 4.4 10*3/uL (ref 4.0–10.5)
nRBC: 0 % (ref 0.0–0.2)

## 2020-12-23 LAB — CMP (CANCER CENTER ONLY)
ALT: 16 U/L (ref 0–44)
AST: 16 U/L (ref 15–41)
Albumin: 3.4 g/dL — ABNORMAL LOW (ref 3.5–5.0)
Alkaline Phosphatase: 72 U/L (ref 38–126)
Anion gap: 13 (ref 5–15)
BUN: 16 mg/dL (ref 8–23)
CO2: 20 mmol/L — ABNORMAL LOW (ref 22–32)
Calcium: 9.1 mg/dL (ref 8.9–10.3)
Chloride: 106 mmol/L (ref 98–111)
Creatinine: 1.13 mg/dL — ABNORMAL HIGH (ref 0.44–1.00)
GFR, Estimated: 50 mL/min — ABNORMAL LOW (ref >60)
Glucose, Bld: 231 mg/dL — ABNORMAL HIGH (ref 70–99)
Potassium: 3.4 mmol/L — ABNORMAL LOW (ref 3.5–5.1)
Sodium: 139 mmol/L (ref 135–145)
Total Bilirubin: 0.9 mg/dL (ref 0.3–1.2)
Total Protein: 6.3 g/dL — ABNORMAL LOW (ref 6.5–8.1)

## 2020-12-23 MED ORDER — DEXAMETHASONE 4 MG PO TABS
12.0000 mg | ORAL_TABLET | Freq: Once | ORAL | Status: AC
  Administered 2020-12-23: 12 mg via ORAL

## 2020-12-23 MED ORDER — FAMOTIDINE 20 MG PO TABS
ORAL_TABLET | ORAL | Status: AC
  Filled 2020-12-23: qty 1

## 2020-12-23 MED ORDER — HEPARIN SOD (PORK) LOCK FLUSH 100 UNIT/ML IV SOLN
500.0000 [IU] | Freq: Once | INTRAVENOUS | Status: AC | PRN
  Administered 2020-12-23: 500 [IU]
  Filled 2020-12-23: qty 5

## 2020-12-23 MED ORDER — SODIUM CHLORIDE 0.9 % IV SOLN
Freq: Once | INTRAVENOUS | Status: AC
  Filled 2020-12-23: qty 250

## 2020-12-23 MED ORDER — ACETAMINOPHEN 325 MG PO TABS
650.0000 mg | ORAL_TABLET | Freq: Once | ORAL | Status: AC
  Administered 2020-12-23: 650 mg via ORAL

## 2020-12-23 MED ORDER — DIPHENHYDRAMINE HCL 25 MG PO TABS
25.0000 mg | ORAL_TABLET | Freq: Once | ORAL | Status: AC
  Administered 2020-12-23: 25 mg via ORAL
  Filled 2020-12-23: qty 1

## 2020-12-23 MED ORDER — ACETAMINOPHEN 325 MG PO TABS
ORAL_TABLET | ORAL | Status: AC
  Filled 2020-12-23: qty 2

## 2020-12-23 MED ORDER — SODIUM CHLORIDE 0.9% FLUSH
10.0000 mL | INTRAVENOUS | Status: DC | PRN
  Administered 2020-12-23: 10 mL
  Filled 2020-12-23: qty 10

## 2020-12-23 MED ORDER — DEXTROSE 5 % IV SOLN
56.0000 mg/m2 | Freq: Once | INTRAVENOUS | Status: AC
  Administered 2020-12-23: 100 mg via INTRAVENOUS
  Filled 2020-12-23: qty 30

## 2020-12-23 MED ORDER — DIPHENHYDRAMINE HCL 25 MG PO CAPS
ORAL_CAPSULE | ORAL | Status: AC
  Filled 2020-12-23: qty 1

## 2020-12-23 MED ORDER — ZOLEDRONIC ACID 4 MG/100ML IV SOLN
4.0000 mg | Freq: Once | INTRAVENOUS | Status: AC
  Administered 2020-12-23: 4 mg via INTRAVENOUS

## 2020-12-23 MED ORDER — FAMOTIDINE 20 MG PO TABS
20.0000 mg | ORAL_TABLET | Freq: Once | ORAL | Status: AC
Start: 2020-12-23 — End: 2020-12-23
  Administered 2020-12-23: 20 mg via ORAL

## 2020-12-23 MED ORDER — ZOLEDRONIC ACID 4 MG/100ML IV SOLN
INTRAVENOUS | Status: AC
  Filled 2020-12-23: qty 100

## 2020-12-23 MED ORDER — PROCHLORPERAZINE MALEATE 10 MG PO TABS
10.0000 mg | ORAL_TABLET | Freq: Once | ORAL | Status: AC
  Administered 2020-12-23: 10 mg via ORAL

## 2020-12-23 MED ORDER — DEXAMETHASONE 4 MG PO TABS
ORAL_TABLET | ORAL | Status: AC
  Filled 2020-12-23: qty 3

## 2020-12-23 MED ORDER — PROCHLORPERAZINE MALEATE 10 MG PO TABS
ORAL_TABLET | ORAL | Status: AC
  Filled 2020-12-23: qty 1

## 2020-12-23 NOTE — Progress Notes (Signed)
Called [employer](Christina)to follow up on pending re-enrollment. Provided additional information.  Patient approved for $11,000 12/31/20 - 12/30/21 after expiration of current grant.  Copy of approval letter given to [facility] for billing/claim submission.  Patient will also receive a copy in the mail in an orange envelope for her records.

## 2020-12-23 NOTE — Patient Instructions (Addendum)
[facility] Discharge Instructions for Patients Receiving Chemotherapy  Today you received the following chemotherapy agents: Kyprolis   To help prevent nausea and vomiting after your treatment, we encourage you to take your nausea medication as directed.   If you develop nausea and vomiting that is not controlled by your nausea medication, call the clinic.   BELOW ARE SYMPTOMS THAT SHOULD BE REPORTED IMMEDIATELY:  *FEVER GREATER THAN 100.5 F  *CHILLS WITH OR WITHOUT FEVER  NAUSEA AND VOMITING THAT IS NOT CONTROLLED WITH YOUR NAUSEA MEDICATION  *UNUSUAL SHORTNESS OF BREATH  *UNUSUAL BRUISING OR BLEEDING  TENDERNESS IN MOUTH AND THROAT WITH OR WITHOUT PRESENCE OF ULCERS  *URINARY PROBLEMS  *BOWEL PROBLEMS  UNUSUAL RASH Items with * indicate a potential emergency and should be followed up as soon as possible.  Feel free to call the clinic should you have any questions or concerns. The clinic phone number is [phone].  Please show the CHEMO ALERT CARD at check-in to the Emergency Department and triage nurse.  Zoledronic Acid Injection (Hypercalcemia, Oncology) What is this medicine? ZOLEDRONIC ACID (ZOE le dron ik AS id) slows calcium loss from bones. It high calcium levels in the blood from some kinds of cancer. It may be used in other people at risk for bone loss. This medicine may be used for other purposes; ask your health care provider or pharmacist if you have questions. COMMON BRAND NAME(S): Zometa What should I tell my health care provider before I take this medicine? They need to know if you have any of these conditions:  cancer  dehydration  dental disease  kidney disease  liver disease  low levels of calcium in the blood  lung or breathing disease (asthma)  receiving steroids like dexamethasone or prednisone  an unusual or allergic reaction to zoledronic acid, other medicines, foods, dyes, or preservatives  pregnant or trying to  get pregnant  breast-feeding How should I use this medicine? This drug is injected into a vein. It is given by a health care provider in a hospital or clinic setting. Talk to your health care provider about the use of this drug in children. Special care may be needed. Overdosage: If you think you have taken too much of this medicine contact a poison control center or emergency room at once. NOTE: This medicine is only for you. Do not share this medicine with others. What if I miss a dose? Keep appointments for follow-up doses. It is important not to miss your dose. Call your health care provider if you are unable to keep an appointment. What may interact with this medicine?  certain antibiotics given by injection  NSAIDs, medicines for pain and inflammation, like ibuprofen or naproxen  some diuretics like bumetanide, furosemide  teriparatide  thalidomide This list may not describe all possible interactions. Give your health care provider a list of all the medicines, herbs, non-prescription drugs, or dietary supplements you use. Also tell them if you smoke, drink alcohol, or use illegal drugs. Some items may interact with your medicine. What should I watch for while using this medicine? Visit your health care provider for regular checks on your progress. It may be some time before you see the benefit from this drug. Some people who take this drug have severe bone, joint, or muscle pain. This drug may also increase your risk for jaw problems or a broken thigh bone. Tell your health care provider right away if you have severe pain in your jaw, bones,   joints, or muscles. Tell you health care provider if you have any pain that does not go away or that gets worse. Tell your dentist and dental surgeon that you are taking this drug. You should not have major dental surgery while on this drug. See your dentist to have a dental exam and fix any dental problems before starting this drug. Take good care  of your teeth while on this drug. Make sure you see your dentist for regular follow-up appointments. You should make sure you get enough calcium and vitamin D while you are taking this drug. Discuss the foods you eat and the vitamins you take with your health care provider. Check with your health care provider if you have severe diarrhea, nausea, and vomiting, or if you sweat a lot. The loss of too much body fluid may make it dangerous for you to take this drug. You may need blood work done while you are taking this drug. Do not become pregnant while taking this drug. Women should inform their health care provider if they wish to become pregnant or think they might be pregnant. There is potential for serious harm to an unborn child. Talk to your health care provider for more information. What side effects may I notice from receiving this medicine? Side effects that you should report to your doctor or health care provider as soon as possible:  allergic reactions (skin rash, itching or hives; swelling of the face, lips, or tongue)  bone pain  infection (fever, chills, cough, sore throat, pain or trouble passing urine)  jaw pain, especially after dental work  joint pain  kidney injury (trouble passing urine or change in the amount of urine)  low blood pressure (dizziness; feeling faint or lightheaded, falls; unusually weak or tired)  low calcium levels (fast heartbeat; muscle cramps or pain; pain, tingling, or numbness in the hands or feet; seizures)  low magnesium levels (fast, irregular heartbeat; muscle cramp or pain; muscle weakness; tremors; seizures)  low red blood cell counts (trouble breathing; feeling faint; lightheaded, falls; unusually weak or tired)  muscle pain  redness, blistering, peeling, or loosening of the skin, including inside the mouth  severe diarrhea  swelling of the ankles, feet, hands  trouble breathing Side effects that usually do not require medical  attention (report to your doctor or health care provider if they continue or are bothersome):  anxious  constipation  coughing  depressed mood  eye irritation, itching, or pain  fever  general ill feeling or flu-like symptoms  nausea  pain, redness, or irritation at site where injected  trouble sleeping This list may not describe all possible side effects. Call your doctor for medical advice about side effects. You may report side effects to FDA at 1-800-FDA-1088. Where should I keep my medicine? This drug is given in a hospital or clinic. It will not be stored at home. NOTE: This sheet is a summary. It may not cover all possible information. If you have questions about this medicine, talk to your doctor, pharmacist, or health care provider.  2021 Elsevier/Gold Standard (2019-09-14 09:13:00)  

## 2020-12-23 NOTE — Progress Notes (Signed)
HEMATOLOGY/ONCOLOGY CLINIC NOTE  Date of Service: 12/23/2020  Patient Care Team: Hoyt Koch, MD as PCP - General (Internal Medicine) Lafayette Dragon, MD (Inactive) as Consulting Physician (Gastroenterology) Megan Salon, MD as Consulting Physician (Gynecology) Melrose Nakayama, MD as Consulting Physician (Orthopedic Surgery) Deneise Lever, MD as Consulting Physician (Pulmonary Disease) [name], MD (Ophthalmology)  CHIEF COMPLAINTS/PURPOSE OF CONSULTATION:  Continue mx of myeloma  HISTORY OF PRESENTING ILLNESS:   Faith Orr is a wonderful 80 y.o. female who has been referred to Faith Orr by Dr. Pricilla Holm for evaluation and management of Lytic bone lesions. She is accompanied today by her son in law, and her partner is present via phone. The pt reports that she is doing well overall.   The pt notes that 2-3 months ago while standing at the stove, and otherwise feeling normally, she felt "something snap that took her breath away" in her mid back as she stretched to get something. The pt notes that she saw a chiropractor twice due to her back stiffness, which did not help. The pt then described her new bone pains to her PCP on 12/05/18, and subsequent imaging, as noted below, revealed concerns for numerous bone lesions. She has begun 42m Fosamax. The pt notes that she had hip pain in 2018, and that an XR at that time did not reveal any lesions.  The pt notes that most of her pain is concentrated to her left shoulder presently, and with minimal movement of the arm. She endorses pain radiating into her left arm and notes that her hand is swollen in the mornings when she wakes up. She also endorses present back pain and right hip pain, worse when she walks. She has not yet seen orthopedics. The pt reports that she is needing to take 6082mAdvil every 4-6 hours as Tramadol alone has not been able to alleviate her pain. The pt denies any unexpected weight loss, fevers,  chills, or night sweats. The pt notes that her urine is a very dark color presently, but denies overt blood in the urine, underpants, nor tissue paper. The pt notes that in the last two weeks she has had some soreness in her head, but denies new headaches or changes in vision. The pt notes that she has been urinating more frequently overall, but has been trying to stay better hydrated as well.  The pt notes that she has been compliant with annual mammograms.   The pt notes that she had a cyst in her right breast which was removed in the past. She fractured her left wrist in 2008 after falling down stairs. The pt endorses history of fatty liver.   The pt denies ever smoking cigarettes and endorses significant second hand smoke exposure with a previous marriage.   Of note prior to the patient's visit today, pt has had a Bone Scan completed on 12/13/18 with results revealing Multifocal uptake throughout the skeleton, consistent with diffuse metastatic disease. Primary tumor is not specified. 2. Uptake in the proximal right femur, consistent with lytic lesions. 3. Uptake in the ribs bilaterally as described. 4. Lesions in the proximal left humerus. 5. Diffuse uptake throughout the skull consistent with metastatic disease. 6. Right paramedian uptake at the manubrium.  Most recent lab results (12/08/18) of CBC w/diff and CMP is as follows: all values are WNL except for Glucose at 279, BUN at 24, AST at 41, ALT at 46. 12/08/18 SPEP revealed all values WNL except for Total  Protein at 6.0, Albumin at 3.6, Gamma globulin at 0.7, and M spike at 0.5g  On review of systems, pt reports significant left shoulder pain, back pain, right hip pain, dark urine, and denies fevers, chills, night sweats, unexpected weight loss, changes in bowel habits, changes in breathing, cough, new respiratory symptoms, changes in vision, abdominal pains, leg swelling, and any other symptoms.   On PMHx the pt reports fatty liver, and  denies blood clots.  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 Denies ever smoking.  On Family Hx the pt reports maternal grandmother with colon cancer. Father with bladder cancer and amyloidosis (pt notes that this could have been misdiagnosis). Mother with Protein S deficiency and polymyalgia rheumatica.  Current Treatment: Carfilzomib + Revlimid maintenance.  Interval History:   Faith Orr returns today for management and evaluation of her Multiple Myeloma. The patient's last visit with Faith Orr was on 11/25/2020. The pt reports that she is doing well overall.  The pt reports she is feeling well overall without significant fatigue and had a good holiday season.  She does note that she developed significant lower back pain radiating to her left upper thigh after picking up her legs to cut her nails .  She does have some tenderness to palpation over her lower back.  We discussed and she is agreeable to get an MRI to have a look at this given her history of myeloma.  No fevers no chills no night sweats.  No other focal bone pains.  No new toxicities from her current treatment regimen.  No new tingling numbness in her hands or feet.  Eating and drinking well. Lab results today (12/23/20) were reviewed with the patient.  MEDICAL HISTORY:  Past Medical History:  Diagnosis Date  . Allergy    seasonal  . Asthma   . DEPRESSION   . DIABETES MELLITUS, TYPE II   . Diverticulosis   . HYPERLIPIDEMIA   . Macular degeneration of left eye    mild, Dr.Hecker  . Obesity, unspecified   . Osteoarthritis of both knees   . OSTEOPENIA   . Osteopenia   . URINARY INCONTINENCE     SURGICAL HISTORY: Past Surgical History:  Procedure Laterality Date  . CATARACT EXTRACTION Left 05/24/2018  . CESAREAN SECTION  01/1973  . CYSTOSCOPY/URETEROSCOPY/HOLMIUM LASER/STENT PLACEMENT Right 09/20/2019   Procedure: CYSTOSCOPY/URETEROSCOPY/HOLMIUM LASER/STENT  PLACEMENT;  Surgeon: Lucas Mallow, MD;  Location: [facility];  Service: Urology;  Laterality: Right;  . FRACTURE SURGERY    . IR IMAGING GUIDED PORT INSERTION  02/20/2019  . left wrist surgery  2008   By Dr. Latanya Maudlin  . right ankle  1994    SOCIAL HISTORY: Social History   Socioeconomic History  . Marital status: Married    Spouse name: Not on file  . Number of children: 1  . Years of education: Not on file  . Highest education level: Not on file  Occupational History    Employer: [employer]  Tobacco Use  . Smoking status: Never Smoker  . Smokeless tobacco: Never Used  . Tobacco comment: Lives with partner [name]) and son  Vaping Use  . Vaping Use: Never used  Substance and Sexual Activity  . Alcohol use: No    Alcohol/week: 0.0 standard drinks  . Drug use: No  . Sexual activity: Never    Partners: Female    Birth control/protection: Post-menopausal  Comment: Lives with female partner (annette hicks) and 64 yo son  Other Topics Concern  . Not on file  Social History Narrative  . Not on file   Social Determinants of Health   Financial Resource Strain: Not on file  Food Insecurity: Not on file  Transportation Needs: Not on file  Physical Activity: Not on file  Stress: Not on file  Social Connections: Not on file  Intimate Partner Violence: Not on file    FAMILY HISTORY: Family History  Problem Relation Age of Onset  . Diabetes Father   . Hyperlipidemia Father   . Heart disease Father   . Cancer Father   . Hypertension Father   . Colon cancer Paternal Grandmother 37  . Osteoporosis Mother   . Protein S deficiency Mother   . Hyperlipidemia Mother   . Multiple sclerosis Daughter   . Cancer Other        bladder  . Breast cancer Neg Hx     ALLERGIES:  is allergic to penicillins, aleve [naproxen sodium], and sulfonamide derivatives.  MEDICATIONS:  Current Outpatient Medications  Medication Sig Dispense Refill  .  acyclovir (ZOVIRAX) 400 MG tablet TAKE 1 TABLET(400 MG) BY MOUTH TWICE DAILY 60 tablet 5  . apixaban (ELIQUIS) 2.5 MG TABS tablet Take 1 tablet (2.5 mg total) by mouth 2 (two) times daily. 60 tablet 5  . Blood Glucose Monitoring Suppl (FREESTYLE FREEDOM LITE) W/DEVICE KIT Use to check blood sugars twice a day Dx 250.00 1 each 0  . Calcium Carbonate-Vitamin D (CALCIUM 600+D HIGH POTENCY) 600-400 MG-UNIT per tablet Take 1 tablet by mouth 2 (two) times daily.     . Cetirizine HCl 10 MG CAPS Take 1 capsule (10 mg total) by mouth daily. 30 capsule 1  . dexamethasone (DECADRON) 4 MG tablet Take 5 tablets (20 mg total) by mouth once a week. On D22 of each cycle of treatment 20 tablet 5  . doxycycline (VIBRA-TABS) 100 MG tablet Take 1 tablet (100 mg total) by mouth 2 (two) times daily. (Patient not taking: Reported on 09/11/2020) 14 tablet 1  . fentaNYL (DURAGESIC) 12 MCG/HR Place 1 patch onto the skin every 3 (three) days. 10 patch 0  . fluticasone (FLONASE) 50 MCG/ACT nasal spray Place 1 spray into both nostrils daily. 16 g 2  . glipiZIDE (GLUCOTROL XL) 5 MG 24 hr tablet TAKE 1 TABLET(5 MG) BY MOUTH DAILY WITH BREAKFAST 90 tablet 1  . glucose blood (FREESTYLE LITE) test strip CHECK BLOOD SUGAR TWICE DAILY AS DIRECTED Dx 250.00 180 each 3  . Lancets (FREESTYLE) lancets Use twice daily to check sugars. 100 each 11  . lenalidomide (REVLIMID) 15 MG capsule TAKE 1 CAPSULE BY MOUTH  DAILY FOR 21 DAYS ON, THEN  7 DAYS OFF 21 capsule 0  . lidocaine-prilocaine (EMLA) cream APPLY 1 APPLICATION TO THE AFFECTED AREA AS NEEDED. USE PRIOR TO PORT ACCESS (Patient not taking: Reported on 09/11/2020) 30 g 0  . LORazepam (ATIVAN) 0.5 MG tablet Take 1 tablet (0.5 mg total) by mouth every 8 (eight) hours as needed for anxiety (significant essential tremors). 60 tablet 0  . metFORMIN (GLUCOPHAGE-XR) 500 MG 24 hr tablet TAKE 3 TABLETS(1500 MG) BY MOUTH DAILY WITH BREAKFAST 270 tablet 1  . methylPREDNISolone (MEDROL DOSEPAK) 4  MG TBPK tablet As directed (Patient not taking: Reported on 09/11/2020) 21 tablet 0  . Multiple Vitamins-Minerals (ICAPS) CAPS Take 1 capsule by mouth daily after breakfast.     . ondansetron (ZOFRAN) 8 MG  tablet Take 1 tablet (8 mg total) by mouth 2 (two) times daily as needed (Nausea or vomiting). 30 tablet 1  . Oxycodone HCl 10 MG TABS Take 1 tablet (10 mg total) by mouth every 6 (six) hours as needed. 90 tablet 0  . pantoprazole (PROTONIX) 20 MG tablet TAKE 1 TABLET(20 MG) BY MOUTH DAILY 30 tablet 5  . polyethylene glycol (MIRALAX / GLYCOLAX) packet Take 17 g by mouth daily after breakfast.  (Patient not taking: Reported on 09/11/2020)    . potassium chloride SA (KLOR-CON) 20 MEQ tablet TAKE 1 TABLET(20 MEQ) BY MOUTH TWICE DAILY 180 tablet 0  . prochlorperazine (COMPAZINE) 10 MG tablet Take 1 tablet (10 mg total) by mouth every 6 (six) hours as needed (Nausea or vomiting). 30 tablet 1  . senna-docusate (SENNA S) 8.6-50 MG tablet Take 2 tablets by mouth at bedtime. 60 tablet 2  . sertraline (ZOLOFT) 50 MG tablet Take 1 tablet (50 mg total) by mouth daily. 90 tablet 3  . simvastatin (ZOCOR) 20 MG tablet TAKE 1 TABLET BY MOUTH EVERY DAY AT 6 PM 30 tablet 2  . Triamcinolone Acetonide 0.025 % LOTN Apply 1 application topically 3 (three) times daily as needed (rash/itching). 60 mL 2  . triamcinolone cream (KENALOG) 0.5 % Apply 1 application topically 4 (four) times daily. 90 g 1  . Vitamin D, Ergocalciferol, (DRISDOL) 1.25 MG (50000 UNIT) CAPS capsule TAKE 1 CAPSULE BY MOUTH EVERY 7 DAYS 12 capsule 0   No current facility-administered medications for this visit.   Facility-Administered Medications Ordered in Other Visits  Medication Dose Route Frequency Provider Last Rate Last Admin  . heparin lock flush 100 unit/mL  500 Units Intracatheter Once PRN Brunetta Genera, MD      . sodium chloride flush (NS) 0.9 % injection 10 mL  10 mL Intracatheter PRN Brunetta Genera, MD   10 mL at 10/02/19  1524    REVIEW OF SYSTEMS:   A 10+ POINT REVIEW OF SYSTEMS WAS OBTAINED including neurology, dermatology, psychiatry, cardiac, respiratory, lymph, extremities, GI, GU, Musculoskeletal, constitutional, breasts, reproductive, HEENT.  All pertinent positives are noted in the HPI.  All others are negative.   PHYSICAL EXAMINATION: ECOG FS:2 - Symptomatic, <50% confined to bed  There were no vitals filed for this visit. Wt Readings from Last 3 Encounters:  12/09/20 158 lb 12 oz (72 kg)  11/25/20 157 lb 1.6 oz (71.3 kg)  11/04/20 159 lb 8 oz (72.3 kg)   There is no height or weight on file to calculate BMI.    No acute distress GENERAL:alert, in no acute distress and comfortable SKIN: no acute rashes, no significant lesions EYES: conjunctiva are pink and non-injected, sclera anicteric OROPHARYNX: MMM, no exudates, no oropharyngeal erythema or ulceration NECK: supple, no JVD LYMPH:  no palpable lymphadenopathy in the cervical, axillary or inguinal regions LUNGS: clear to auscultation b/l with normal respiratory effort HEART: regular rate & rhythm ABDOMEN:  normoactive bowel sounds , non tender, not distended. No palpable hepatosplenomegaly.  Extremity: no pedal edema.  Moderate tenderness to palpation over the lower lumbar spine and bilateral sacroiliac areas. PSYCH: alert & oriented x 3 with fluent speech NEURO: no focal motor/sensory deficits  LABORATORY DATA:  I have reviewed the data as listed  . CBC Latest Ref Rng & Units 12/23/2020 12/09/2020 11/25/2020  WBC 4.0 - 10.5 K/uL 4.4 4.6 4.0  Hemoglobin 12.0 - 15.0 g/dL 12.1 12.3 11.9(L)  Hematocrit 36.0 - 46.0 % 36.0 37.2 36.7  Platelets 150 - 400 K/uL 110(L) 148(L) 125(L)   . CBC    Component Value Date/Time   WBC 4.4 12/23/2020 0856   RBC 3.56 (L) 12/23/2020 0856   HGB 12.1 12/23/2020 0856   HGB 12.8 02/16/2019 1138   HCT 36.0 12/23/2020 0856   PLT 110 (L) 12/23/2020 0856   PLT 170 02/16/2019 1138   MCV 101.1 (H)  12/23/2020 0856   MCH 34.0 12/23/2020 0856   MCHC 33.6 12/23/2020 0856   RDW 13.7 12/23/2020 0856   LYMPHSABS 0.9 12/23/2020 0856   MONOABS 1.0 12/23/2020 0856   EOSABS 0.2 12/23/2020 0856   BASOSABS 0.1 12/23/2020 0856    . CMP Latest Ref Rng & Units 12/23/2020 12/09/2020 11/25/2020  Glucose 70 - 99 mg/dL 231(H) 214(H) 191(H)  BUN 8 - 23 mg/dL _0 Creatinine 0.44 - 1.00 mg/dL 1.13(H) 1.19(H) 0.90  Sodium 135 - 145 mmol/L 139 139 140  Potassium 3.5 - 5.1 mmol/L 3.4(L) 3.7 3.6  Chloride 98 - 111 mmol/L 106 110 108  CO2 22 - 32 mmol/L 20(L) 19(L) 21(L)  Calcium 8.9 - 10.3 mg/dL 9.1 9.2 9.7  Total Protein 6.5 - 8.1 g/dL 6.3(L) 6.3(L) 6.1(L)  Total Bilirubin 0.3 - 1.2 mg/dL 0.9 0.5 0.7  Alkaline Phos 38 - 126 U/L 72 83 68  AST 15 - 41 U/L 16 12(L) 13(L)  ALT 0 - 44 U/L _1 09/18/2019 BM Bx Report ([id number])   09/18/2019 FISH Panel    05/30/2019 BM Bx   01/06/2019 BM Bx:     01/06/19 Cytogenetics:      05/30/19 BM Biopsy:   09/18/2019 FISH Panel    09/18/2019 BM Surgical Pathology ([id number])     RADIOGRAPHIC STUDIES: I have personally reviewed the radiological images as listed and agreed with the findings in the report. No results found.  ASSESSMENT & PLAN:   80 y.o. female with  1. Recently diagnosed Multiple Myeloma, RISS Stage III  Labs upon initial presentation from 12/08/18, blood counts are normal including WBC at 7.1k, HGB at 13.1, and PLT at 245k. Calcium normal at 10.3. Creatinine normal at 0.63. M spike at 0.5g. 12/13/18 Bone Scan revealed Multifocal uptake throughout the skeleton, consistent with diffuse metastatic disease. Primary tumor is not specified. 2. Uptake in the proximal right femur, consistent with lytic lesions. 3. Uptake in the ribs bilaterally as described. 4. Lesions in the proximal left humerus. 5. Diffuse uptake throughout the skull consistent with metastatic disease. 6. Right paramedian uptake at the  manubrium.  12/13/18 CT Right Femur revealed Numerous lytic lesions involving the right femur and a lytic lesion in the left inferior pubic ramus. Overall appearance is most concerning for multiple myeloma  12/27/18 Pretreatment 24hour UPEP observed an M spike at 75m, and showed 1925mtotal protein/day.  12/27/18 Pretreatment MMP revealed M Protein at 0.5g with IgG Lambda specificity. Kappa:Lambda light chain ratio at 0.13, with Lambda at 40.3. There is less abnormal protein and light chains than I would expect from 30% plasma cells, which suggests hypo-secretory or non-secretory neoplastic plasma cells. Will have an impact in assessing response. 01/05/19 PET/CT revealed Innumerable lytic lesions in the skeleton compatible with myeloma. Most of the larger lesions are hypermetabolic, for example including a left proximal humeral shaft lesion with maximum SUV of 8.1 and a 2.8 cm lesion in the left T9 vertebral body with maximum SUV 5.1. Most of the smaller lytic lesions, and some of the larger lesions, do not  demonstrate accentuated metabolic activity. 2. 1.2 cm in short axis lymph node in the left parapharyngeal space is hypermetabolic with maximum SUV 11.8. I do not see a separate mass in the head and neck to give rise to this hypermetabolic lymph node. 3. Mosaic attenuation in the lower lobes, nonspecific possibly from air trapping. 4.  Aortic Atherosclerosis 5. Heterogeneous activity in the liver, making it hard to exclude small liver lesions. Consider hepatic protocol MRI with and without contrast for definitive assessment. Nonobstructive right nephrolithiasis. Old granulomatous disease  01/06/19 Bone Marrow biopsy revealed interstitial increase in plasma cells (28% aspirate, 40% CD138 immunohistochemistry). Plasma cells negative for light chains consistent with a non or weakly secretory myeloma   01/06/19 Cytogenetics revealed 37% of cells with trisomy 11 or 11q deletion, and 40.5% of cells with 17p  mutation  S/p 5 cycles of KRD treatment  05/31/19 BM Biopsy revealed mild atypical plasmacytosis at 5% with polytypic variation.   06/01/19 PET/CT revealed "Dominant lesion in the LEFT humerus is decreased significantly in metabolic activity. Additional hypermetabolic skeletal lytic lesions have decreased in metabolic activity or similar to comparison exam (01/05/2019). No evidence of disease progression. 2. Multiple additional lytic lesions do not have metabolic activity and unchanged. 3. No new skeletal lesions are identified. No soft tissue plasmacytoma identified. 4. Nodule / node in the LEFT parapharyngeal space which is intensely hypermetabolic not changed from prior. 5. New hypermetabolic LEFT lower lobe pulmonary nodule is indeterminate. Recommend close attention on follow-up 6. New obstructive hydronephrosis of the RIGHT kidney related to RIGHT UPJ stone."  09/18/2019 BM Bx Report which revealed "Slightly hypercellular bone marrow for age with trilineage hematopoiesis and 1% plasma cells."  09/14/2019 PET/CT Whole Body Scan ([id number]) which revealed "1. There widespread tiny lytic lesions compatible with multiple myeloma. Index larger lesions are generally similar to the prior exam, with low-grade activity such as the left T9 vertebral body lesion with maximum SUV 4.5. Is mild increase in the activity associated with a mildly sclerotic left proximal humeral lesion, maximum SUV 4.8 (previously 3.5). 2. At the site of the prior left lower lobe nodule is currently more bandlike thickening, with maximum SUV only 1.9, probably benign, continued surveillance of this region suggested. 3. There several small but hypermetabolic lymph nodes. This includes a left parapharyngeal space node measuring 1.0 cm with maximum SUV 12.3 (stable); a left level IB lymph node measuring 0.5 cm with maximum SUV 4.8 (slightly larger than prior); and a left inguinal lymph node measuring 0.7 cm in short axis with maximum SUV  6.4 (previously 0.5 cm with maximum SUV 0.6). Significance of these lymph nodes uncertain, surveillance is recommended. 4. New 5 mm left lower lobe subpleural nodule on image 32/8, not appreciably hypermetabolic, surveillance suggested. 5. Focal subcutaneous stranding along the left perineum measuring about 2.6 by 1.1 cm on image 221/4, maximum SUV 12.5. This was not present previously and is most likely inflammatory, although given the notable SUV, surveillance of this region is suggested. 6. Other imaging findings of potential clinical significance: Aortic Atherosclerosis (ICD10-I70.0). Coronary atherosclerosis. Old granulomatous disease. Mild right hydronephrosis due to a 7 mm right UPJ calculus. 2 mm right kidney upper pole nonobstructive renal calculus. Prominent stool throughout the colon favors constipation."  12/19/2019 Thoracic & Lumbar Spine MRI ([id number]) ([id number]) revealed "Suspected myeloma lesions at T9 and S1. No compression deformity or epidural disease.  01/18/2020 PET/CT ([id number]) which revealed "1. Stable lytic lesions throughout the skeleton. The larger lytic lesions which had mild metabolic  activity on comparison exam now have background metabolic activity. No evidence of active myeloma. No evidence of progression multiple myeloma.  No plasmacytoma 3. Hypermetabolic nodules in the LEFT neck may be associated deep tissues of the LEFT parotid gland. 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  01/17/19 MRI Liver revealed Several appreciable liver lesions all have benign imaging characteristics. No MRI findings of metastatic involvement of the liver. 2. Scattered bony lesions corresponding to the lytic lesions seen at PET-CT, compatible with active myeloma. 3. Aortic Atherosclerosis.  Mild  cardiomegaly. 4. Diffuse hepatic steatosis.   3. Left lower lobe pulmonary nodule First seen on 06/01/19 PET/CT PET/CT 4/41/7127: No hypermetabolic mediastinal or hilar nodes. No suspicious pulmonary nodules on the CT scan.  4. 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 Being managed conservatively as per patient's preference.  No symptoms from this currently.  5. acute new lower back pain with tenderness to palpation in the lower back.  Rule out new myeloma lesions. PLAN: -Labs today discussed with patient stable. --The pt has no prohibitive toxicities from continuing C22D15 Carfilzomib at this time. -Will continue Carflizomib at 56 mg/m^2q 2weeks for maintenance. Will consider changing if fatigue becomes limiting.  -Continue 15 mg Revlimid 3 weeks on, 1 week off for maintenance. No prohibitive toxicities at this time. -Recommend pt continue 2.5 Eliquis BID for VTE prophylaxis -Continue Potassium daily  -Continue Zometa q4weeks -MRI lumbar spine and sacrum with and without contrast in the next 1 to 2 weeks.   FOLLOW UP: Plz schedule next 2 cycles (4 doses of Kyprolis) as per orders Portflush and labs with each treatment MRI Lumbosacral spine in 1 week MD visit in 4 weeks   The total time spent in the appt was 30 minutes and more than 50% was on counseling and direct patient cares.  All of the patient's questions were answered with apparent satisfaction. The patient knows to call the clinic with any problems, questions or concerns.    Sullivan Lone MD [place] AAHIVMS [facility] Hematology/Oncology Physician [facility]  (Office):       [phone] (Work cell):  [phone] (Fax):           [phone]  12/23/2020 12:06 AM  I, Yevette Edwards, am acting as a scribe for Dr. Sullivan Lone.   .I have reviewed the above documentation for accuracy and completeness, and I agree with the  above. Brunetta Genera MD

## 2020-12-24 ENCOUNTER — Other Ambulatory Visit: Payer: Self-pay | Admitting: *Deleted

## 2020-12-24 ENCOUNTER — Encounter: Payer: Self-pay | Admitting: Hematology

## 2020-12-24 DIAGNOSIS — C9000 Multiple myeloma not having achieved remission: Secondary | ICD-10-CM

## 2020-12-24 DIAGNOSIS — E876 Hypokalemia: Secondary | ICD-10-CM

## 2020-12-25 ENCOUNTER — Other Ambulatory Visit: Payer: Self-pay | Admitting: *Deleted

## 2020-12-26 MED ORDER — OXYCODONE HCL 10 MG PO TABS: 10.0000 mg | ORAL_TABLET | Freq: Four times a day (QID) | ORAL | 0 refills | Status: DC | PRN

## 2020-12-26 MED ORDER — VITAMIN D (ERGOCALCIFEROL) 1.25 MG (50000 UNIT) PO CAPS: ORAL_CAPSULE | ORAL | 0 refills | Status: DC

## 2020-12-26 MED ORDER — POTASSIUM CHLORIDE CRYS ER 20 MEQ PO TBCR: EXTENDED_RELEASE_TABLET | ORAL | 0 refills | Status: DC

## 2020-12-27 ENCOUNTER — Encounter: Payer: Self-pay | Admitting: Internal Medicine

## 2020-12-27 ENCOUNTER — Other Ambulatory Visit: Payer: Self-pay | Admitting: Hematology

## 2020-12-27 DIAGNOSIS — C9000 Multiple myeloma not having achieved remission: Secondary | ICD-10-CM

## 2020-12-27 MED ORDER — SERTRALINE HCL 50 MG PO TABS: 50.0000 mg | ORAL_TABLET | Freq: Every day | ORAL | 3 refills | Status: DC

## 2020-12-31 ENCOUNTER — Other Ambulatory Visit: Payer: Self-pay | Admitting: *Deleted

## 2020-12-31 ENCOUNTER — Telehealth: Payer: Self-pay | Admitting: *Deleted

## 2020-12-31 ENCOUNTER — Encounter: Payer: Self-pay | Admitting: Hematology

## 2020-12-31 ENCOUNTER — Other Ambulatory Visit: Payer: Self-pay | Admitting: Hematology

## 2020-12-31 DIAGNOSIS — C9000 Multiple myeloma not having achieved remission: Secondary | ICD-10-CM

## 2020-12-31 DIAGNOSIS — E876 Hypokalemia: Secondary | ICD-10-CM

## 2020-12-31 DIAGNOSIS — Z7189 Other specified counseling: Secondary | ICD-10-CM

## 2020-12-31 MED ORDER — ACYCLOVIR 400 MG PO TABS: ORAL_TABLET | ORAL | 5 refills | Status: DC

## 2020-12-31 MED ORDER — FENTANYL 12 MCG/HR TD PT72: 1.0000 | MEDICATED_PATCH | TRANSDERMAL | 0 refills | Status: DC

## 2020-12-31 MED ORDER — POTASSIUM CHLORIDE CRYS ER 20 MEQ PO TBCR: EXTENDED_RELEASE_TABLET | ORAL | 0 refills | Status: DC

## 2020-12-31 MED ORDER — LIDOCAINE-PRILOCAINE 2.5-2.5 % EX CREA: TOPICAL_CREAM | CUTANEOUS | 0 refills | Status: DC

## 2020-12-31 NOTE — Telephone Encounter (Signed)
error 

## 2021-01-06 ENCOUNTER — Inpatient Hospital Stay: Payer: Medicare PPO

## 2021-01-06 ENCOUNTER — Other Ambulatory Visit: Payer: Self-pay | Admitting: Hematology

## 2021-01-06 ENCOUNTER — Other Ambulatory Visit: Payer: Self-pay

## 2021-01-06 VITALS — BP 133/69 | HR 77 | Temp 98.3°F | Resp 18 | Wt 156.5 lb

## 2021-01-06 DIAGNOSIS — Z7189 Other specified counseling: Secondary | ICD-10-CM

## 2021-01-06 DIAGNOSIS — C9000 Multiple myeloma not having achieved remission: Secondary | ICD-10-CM | POA: Diagnosis not present

## 2021-01-06 DIAGNOSIS — Z79899 Other long term (current) drug therapy: Secondary | ICD-10-CM | POA: Diagnosis not present

## 2021-01-06 DIAGNOSIS — Z5112 Encounter for antineoplastic immunotherapy: Secondary | ICD-10-CM | POA: Diagnosis not present

## 2021-01-06 DIAGNOSIS — Z5111 Encounter for antineoplastic chemotherapy: Secondary | ICD-10-CM

## 2021-01-06 LAB — CMP (CANCER CENTER ONLY)
ALT: 11 U/L (ref 0–44)
AST: 10 U/L — ABNORMAL LOW (ref 15–41)
Albumin: 3.2 g/dL — ABNORMAL LOW (ref 3.5–5.0)
Alkaline Phosphatase: 84 U/L (ref 38–126)
Anion gap: 10 (ref 5–15)
BUN: 12 mg/dL (ref 8–23)
CO2: 19 mmol/L — ABNORMAL LOW (ref 22–32)
Calcium: 9.1 mg/dL (ref 8.9–10.3)
Chloride: 110 mmol/L (ref 98–111)
Creatinine: 1.12 mg/dL — ABNORMAL HIGH (ref 0.44–1.00)
GFR, Estimated: 51 mL/min — ABNORMAL LOW (ref >60)
Glucose, Bld: 232 mg/dL — ABNORMAL HIGH (ref 70–99)
Potassium: 3.1 mmol/L — ABNORMAL LOW (ref 3.5–5.1)
Sodium: 139 mmol/L (ref 135–145)
Total Bilirubin: 0.6 mg/dL (ref 0.3–1.2)
Total Protein: 6.2 g/dL — ABNORMAL LOW (ref 6.5–8.1)

## 2021-01-06 LAB — CBC WITH DIFFERENTIAL/PLATELET
Abs Immature Granulocytes: 0.01 10*3/uL (ref 0.00–0.07)
Basophils Absolute: 0 10*3/uL (ref 0.0–0.1)
Basophils Relative: 1 %
Eosinophils Absolute: 0.1 10*3/uL (ref 0.0–0.5)
Eosinophils Relative: 2 %
HCT: 35.1 % — ABNORMAL LOW (ref 36.0–46.0)
Hemoglobin: 11.8 g/dL — ABNORMAL LOW (ref 12.0–15.0)
Immature Granulocytes: 0 %
Lymphocytes Relative: 25 %
Lymphs Abs: 1 10*3/uL (ref 0.7–4.0)
MCH: 33.9 pg (ref 26.0–34.0)
MCHC: 33.6 g/dL (ref 30.0–36.0)
MCV: 100.9 fL — ABNORMAL HIGH (ref 80.0–100.0)
Monocytes Absolute: 0.7 10*3/uL (ref 0.1–1.0)
Monocytes Relative: 18 %
Neutro Abs: 2.1 10*3/uL (ref 1.7–7.7)
Neutrophils Relative %: 54 %
Platelets: 158 10*3/uL (ref 150–400)
RBC: 3.48 MIL/uL — ABNORMAL LOW (ref 3.87–5.11)
RDW: 13.8 % (ref 11.5–15.5)
WBC: 3.9 10*3/uL — ABNORMAL LOW (ref 4.0–10.5)
nRBC: 0 % (ref 0.0–0.2)

## 2021-01-06 MED ORDER — ACETAMINOPHEN 325 MG PO TABS
650.0000 mg | ORAL_TABLET | Freq: Once | ORAL | Status: AC
  Administered 2021-01-06: 650 mg via ORAL

## 2021-01-06 MED ORDER — DIPHENHYDRAMINE HCL 25 MG PO CAPS
25.0000 mg | ORAL_CAPSULE | Freq: Once | ORAL | Status: AC
  Administered 2021-01-06: 25 mg via ORAL

## 2021-01-06 MED ORDER — FAMOTIDINE 20 MG PO TABS
ORAL_TABLET | ORAL | Status: AC
  Filled 2021-01-06: qty 1

## 2021-01-06 MED ORDER — DEXAMETHASONE 4 MG PO TABS
ORAL_TABLET | ORAL | Status: AC
  Filled 2021-01-06: qty 3

## 2021-01-06 MED ORDER — DIPHENHYDRAMINE HCL 25 MG PO CAPS
ORAL_CAPSULE | ORAL | Status: AC
  Filled 2021-01-06: qty 1

## 2021-01-06 MED ORDER — SODIUM CHLORIDE 0.9% FLUSH
10.0000 mL | INTRAVENOUS | Status: DC | PRN
  Administered 2021-01-06: 10 mL
  Filled 2021-01-06: qty 10

## 2021-01-06 MED ORDER — PROCHLORPERAZINE MALEATE 10 MG PO TABS: 10.0000 mg | ORAL_TABLET | Freq: Once | ORAL | Status: DC

## 2021-01-06 MED ORDER — FAMOTIDINE 20 MG PO TABS
20.0000 mg | ORAL_TABLET | Freq: Once | ORAL | Status: AC
  Administered 2021-01-06: 20 mg via ORAL

## 2021-01-06 MED ORDER — HEPARIN SOD (PORK) LOCK FLUSH 100 UNIT/ML IV SOLN
500.0000 [IU] | Freq: Once | INTRAVENOUS | Status: AC | PRN
  Administered 2021-01-06: 500 [IU]
  Filled 2021-01-06: qty 5

## 2021-01-06 MED ORDER — DEXAMETHASONE 4 MG PO TABS
12.0000 mg | ORAL_TABLET | Freq: Once | ORAL | Status: AC
  Administered 2021-01-06: 12 mg via ORAL

## 2021-01-06 MED ORDER — CARFILZOMIB CHEMO INJECTION 60 MG
56.0000 mg/m2 | Freq: Once | INTRAVENOUS | Status: AC
  Administered 2021-01-06: 100 mg via INTRAVENOUS
  Filled 2021-01-06: qty 30

## 2021-01-06 MED ORDER — SODIUM CHLORIDE 0.9 % IV SOLN
Freq: Once | INTRAVENOUS | Status: AC
  Filled 2021-01-06: qty 250

## 2021-01-06 MED ORDER — ACETAMINOPHEN 325 MG PO TABS
ORAL_TABLET | ORAL | Status: AC
  Filled 2021-01-06: qty 2

## 2021-01-06 NOTE — Patient Instructions (Addendum)
[facility] Discharge Instructions for Patients Receiving Chemotherapy  Today you received the following chemotherapy agents:  Kyprolis  To help prevent nausea and vomiting after your treatment, we encourage you to take your nausea medication as directed.   If you develop nausea and vomiting that is not controlled by your nausea medication, call the clinic.   BELOW ARE SYMPTOMS THAT SHOULD BE REPORTED IMMEDIATELY:  *FEVER GREATER THAN 100.5 F  *CHILLS WITH OR WITHOUT FEVER  NAUSEA AND VOMITING THAT IS NOT CONTROLLED WITH YOUR NAUSEA MEDICATION  *UNUSUAL SHORTNESS OF BREATH  *UNUSUAL BRUISING OR BLEEDING  TENDERNESS IN MOUTH AND THROAT WITH OR WITHOUT PRESENCE OF ULCERS  *URINARY PROBLEMS  *BOWEL PROBLEMS  UNUSUAL RASH Items with * indicate a potential emergency and should be followed up as soon as possible.  Feel free to call the clinic should you have any questions or concerns. The clinic phone number is [phone].  Please show the CHEMO ALERT CARD at check-in to the Emergency Department and triage nurse.   

## 2021-01-15 ENCOUNTER — Other Ambulatory Visit: Payer: Self-pay | Admitting: Hematology

## 2021-01-15 ENCOUNTER — Ambulatory Visit (HOSPITAL_COMMUNITY)
Admission: RE | Admit: 2021-01-15 | Discharge: 2021-01-15 | Disposition: A | Discharge status: Home / Self Care | Payer: Medicare PPO | Source: Ambulatory Visit | Attending: Hematology | Admitting: Hematology

## 2021-01-15 ENCOUNTER — Other Ambulatory Visit: Payer: Self-pay

## 2021-01-15 ENCOUNTER — Encounter: Payer: Self-pay | Admitting: Hematology

## 2021-01-15 DIAGNOSIS — M545 Low back pain, unspecified: Secondary | ICD-10-CM | POA: Diagnosis not present

## 2021-01-15 DIAGNOSIS — M4696 Unspecified inflammatory spondylopathy, lumbar region: Secondary | ICD-10-CM | POA: Diagnosis not present

## 2021-01-15 DIAGNOSIS — C9000 Multiple myeloma not having achieved remission: Secondary | ICD-10-CM

## 2021-01-15 DIAGNOSIS — M25552 Pain in left hip: Secondary | ICD-10-CM

## 2021-01-15 DIAGNOSIS — M47816 Spondylosis without myelopathy or radiculopathy, lumbar region: Secondary | ICD-10-CM | POA: Diagnosis not present

## 2021-01-15 DIAGNOSIS — R600 Localized edema: Secondary | ICD-10-CM | POA: Diagnosis not present

## 2021-01-15 MED ORDER — GADOBUTROL 1 MMOL/ML IV SOLN
7.0000 mL | Freq: Once | INTRAVENOUS | Status: AC | PRN
  Administered 2021-01-15: 7 mL via INTRAVENOUS

## 2021-01-15 NOTE — Progress Notes (Unsigned)
Mri

## 2021-01-19 NOTE — Progress Notes (Signed)
HEMATOLOGY/ONCOLOGY CLINIC NOTE  Date of Service: 01/19/2021  Patient Care Team: Hoyt Koch, MD as PCP - General (Internal Medicine) Lafayette Dragon, MD (Inactive) as Consulting Physician (Gastroenterology) Megan Salon, MD as Consulting Physician (Gynecology) Melrose Nakayama, MD as Consulting Physician (Orthopedic Surgery) Deneise Lever, MD as Consulting Physician (Pulmonary Disease) [name], MD (Ophthalmology)  CHIEF COMPLAINTS/PURPOSE OF CONSULTATION:  Continue mx of myeloma  HISTORY OF PRESENTING ILLNESS:   Faith Orr is a wonderful 80 y.o. female who has been referred to [name] by Dr. Pricilla Holm for evaluation and management of Lytic bone lesions. She is accompanied today by her son in law, and her partner is present via phone. The pt reports that she is doing well overall.   The pt notes that 2-3 months ago while standing at the stove, and otherwise feeling normally, she felt "something snap that took her breath away" in her mid back as she stretched to get something. The pt notes that she saw a chiropractor twice due to her back stiffness, which did not help. The pt then described her new bone pains to her PCP on 12/05/18, and subsequent imaging, as noted below, revealed concerns for numerous bone lesions. She has begun 36m Fosamax. The pt notes that she had hip pain in 2018, and that an XR at that time did not reveal any lesions.  The pt notes that most of her pain is concentrated to her left shoulder presently, and with minimal movement of the arm. She endorses pain radiating into her left arm and notes that her hand is swollen in the mornings when she wakes up. She also endorses present back pain and right hip pain, worse when she walks. She has not yet seen orthopedics. The pt reports that she is needing to take 6012mAdvil every 4-6 hours as Tramadol alone has not been able to alleviate her pain. The pt denies any unexpected weight loss, fevers,  chills, or night sweats. The pt notes that her urine is a very dark color presently, but denies overt blood in the urine, underpants, nor tissue paper. The pt notes that in the last two weeks she has had some soreness in her head, but denies new headaches or changes in vision. The pt notes that she has been urinating more frequently overall, but has been trying to stay better hydrated as well.  The pt notes that she has been compliant with annual mammograms.   The pt notes that she had a cyst in her right breast which was removed in the past. She fractured her left wrist in 2008 after falling down stairs. The pt endorses history of fatty liver.   The pt denies ever smoking cigarettes and endorses significant second hand smoke exposure with a previous marriage.   Of note prior to the patient's visit today, pt has had a Bone Scan completed on 12/13/18 with results revealing Multifocal uptake throughout the skeleton, consistent with diffuse metastatic disease. Primary tumor is not specified. 2. Uptake in the proximal right femur, consistent with lytic lesions. 3. Uptake in the ribs bilaterally as described. 4. Lesions in the proximal left humerus. 5. Diffuse uptake throughout the skull consistent with metastatic disease. 6. Right paramedian uptake at the manubrium.  Most recent lab results (12/08/18) of CBC w/diff and CMP is as follows: all values are WNL except for Glucose at 279, BUN at 24, AST at 41, ALT at 46. 12/08/18 SPEP revealed all values WNL except for Total  Protein at 6.0, Albumin at 3.6, Gamma globulin at 0.7, and M spike at 0.5g  On review of systems, pt reports significant left shoulder pain, back pain, right hip pain, dark urine, and denies fevers, chills, night sweats, unexpected weight loss, changes in bowel habits, changes in breathing, cough, new respiratory symptoms, changes in vision, abdominal pains, leg swelling, and any other symptoms.   On PMHx the pt reports fatty liver, and  denies blood clots.  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 Denies ever smoking.  On Family Hx the pt reports maternal grandmother with colon cancer. Father with bladder cancer and amyloidosis (pt notes that this could have been misdiagnosis). Mother with Protein S deficiency and polymyalgia rheumatica.  Current Treatment: Carfilzomib + Revlimid maintenance.  Interval History:   Faith Orr returns today for management and evaluation of her Multiple Myeloma. The patient's last visit with [name] was on 12/23/2020. The pt reports that she is doing well overall.  The pt reports no new concerns or symptoms. She recently had a fall from bed while sleeping and landed on her left hip. The pt notes that she has been experiencing constipation. She is taking Senna at night and Miralax in the morning. This is unchanged on days she has treatment versus not. She is tolerating the medicines well.  Of note since the patient's last visit, pt has had MR Lumbar Spine wo contrast ([id number]) on 01/15/2021, which showed "1. No pathologic fracture or focal bone marrow lesion of the lumbar spine. Please see dedicated report for MRI sacrum for discussion of the sacrum. 2. Unchanged examination of the lumbar spine with moderate right L5-S1 neural foraminal stenosis and mild bilateral L4-L5 neural foraminal stenosis. 3. No central spinal canal stenosis"  The pt had MR Sacrum SI Joints wo contrast ([id number]) on 01/15/2021, which showed "1. Similar appearing small myeloma lesion in the S1 spinous process. No new lesion. 2. Moderate L4-L5 facet arthropathy with active degenerative inflammatory changes, which can be a source of lower back pain."  Lab results today 01/20/2021 of CBC w/diff and CMP is as follows: all values are WNL except for RBC of 3.66, MCV of 103.8, Plt of 107K. CMP reviewed  On review of systems, pt reports constipation and denies acute fatigue,  decreased appetite, new bone pains, fevers, chills, night sweats, abdominal pain, leg swelling, acute back pain, diarrhea and any other symptoms.  MEDICAL HISTORY:  Past Medical History:  Diagnosis Date  . Allergy    seasonal  . Asthma   . DEPRESSION   . DIABETES MELLITUS, TYPE II   . Diverticulosis   . HYPERLIPIDEMIA   . Macular degeneration of left eye    mild, Dr.Hecker  . Obesity, unspecified   . Osteoarthritis of both knees   . OSTEOPENIA   . Osteopenia   . URINARY INCONTINENCE     SURGICAL HISTORY: Past Surgical History:  Procedure Laterality Date  . CATARACT EXTRACTION Left 05/24/2018  . CESAREAN SECTION  01/1973  . CYSTOSCOPY/URETEROSCOPY/HOLMIUM LASER/STENT PLACEMENT Right 09/20/2019   Procedure: CYSTOSCOPY/URETEROSCOPY/HOLMIUM LASER/STENT PLACEMENT;  Surgeon: Lucas Mallow, MD;  Location: [facility];  Service: Urology;  Laterality: Right;  . FRACTURE SURGERY    . IR IMAGING GUIDED PORT INSERTION  02/20/2019  . left wrist surgery  2008   By Dr. Latanya Maudlin  . right ankle  1994    SOCIAL HISTORY: Social History   Socioeconomic History  . Marital  status: Married    Spouse name: Not on file  . Number of children: 1  . Years of education: Not on file  . Highest education level: Not on file  Occupational History    Employer: [employer]  Tobacco Use  . Smoking status: Never Smoker  . Smokeless tobacco: Never Used  . Tobacco comment: Lives with partner [name]) and son  Vaping Use  . Vaping Use: Never used  Substance and Sexual Activity  . Alcohol use: No    Alcohol/week: 0.0 standard drinks  . Drug use: No  . Sexual activity: Never    Partners: Female    Birth control/protection: Post-menopausal    Comment: Lives with female partner (annette hicks) and 55 yo son  Other Topics Concern  . Not on file  Social History Narrative  . Not on file   Social Determinants of Health   Financial Resource Strain: Not on file   Food Insecurity: Not on file  Transportation Needs: Not on file  Physical Activity: Not on file  Stress: Not on file  Social Connections: Not on file  Intimate Partner Violence: Not on file    FAMILY HISTORY: Family History  Problem Relation Age of Onset  . Diabetes Father   . Hyperlipidemia Father   . Heart disease Father   . Cancer Father   . Hypertension Father   . Colon cancer Paternal Grandmother 50  . Osteoporosis Mother   . Protein S deficiency Mother   . Hyperlipidemia Mother   . Multiple sclerosis Daughter   . Cancer Other        bladder  . Breast cancer Neg Hx     ALLERGIES:  is allergic to penicillins, aleve [naproxen sodium], and sulfonamide derivatives.  MEDICATIONS:  Current Outpatient Medications  Medication Sig Dispense Refill  . acyclovir (ZOVIRAX) 400 MG tablet TAKE 1 TABLET(400 MG) BY MOUTH TWICE DAILY 60 tablet 5  . Blood Glucose Monitoring Suppl (FREESTYLE FREEDOM LITE) W/DEVICE KIT Use to check blood sugars twice a day Dx 250.00 1 each 0  . Calcium Carbonate-Vitamin D (CALCIUM 600+D HIGH POTENCY) 600-400 MG-UNIT per tablet Take 1 tablet by mouth 2 (two) times daily.     . Cetirizine HCl 10 MG CAPS Take 1 capsule (10 mg total) by mouth daily. 30 capsule 1  . dexamethasone (DECADRON) 4 MG tablet Take 5 tablets (20 mg total) by mouth once a week. On D22 of each cycle of treatment 20 tablet 5  . doxycycline (VIBRA-TABS) 100 MG tablet Take 1 tablet (100 mg total) by mouth 2 (two) times daily. (Patient not taking: Reported on 09/11/2020) 14 tablet 1  . ELIQUIS 2.5 MG TABS tablet TAKE 1 TABLET(2.5 MG) BY MOUTH TWICE DAILY 60 tablet 2  . fentaNYL (DURAGESIC) 12 MCG/HR Place 1 patch onto the skin every 3 (three) days. 10 patch 0  . fluticasone (FLONASE) 50 MCG/ACT nasal spray Place 1 spray into both nostrils daily. 16 g 2  . glipiZIDE (GLUCOTROL XL) 5 MG 24 hr tablet TAKE 1 TABLET(5 MG) BY MOUTH DAILY WITH BREAKFAST 90 tablet 1  . glucose blood (FREESTYLE LITE)  test strip CHECK BLOOD SUGAR TWICE DAILY AS DIRECTED Dx 250.00 180 each 3  . Lancets (FREESTYLE) lancets Use twice daily to check sugars. 100 each 11  . lenalidomide (REVLIMID) 15 MG capsule TAKE 1 CAPSULE BY MOUTH  DAILY FOR 21 DAYS ON, THEN  7 DAYS OFF 21 capsule 0  . lidocaine-prilocaine (EMLA) cream APPLY 1 APPLICATION  TO THE AFFECTED AREA AS NEEDED. USE PRIOR TO PORT ACCESS 30 g 0  . LORazepam (ATIVAN) 0.5 MG tablet Take 1 tablet (0.5 mg total) by mouth every 8 (eight) hours as needed for anxiety (significant essential tremors). 60 tablet 0  . metFORMIN (GLUCOPHAGE-XR) 500 MG 24 hr tablet TAKE 3 TABLETS(1500 MG) BY MOUTH DAILY WITH BREAKFAST 270 tablet 1  . methylPREDNISolone (MEDROL DOSEPAK) 4 MG TBPK tablet As directed (Patient not taking: Reported on 09/11/2020) 21 tablet 0  . Multiple Vitamins-Minerals (ICAPS) CAPS Take 1 capsule by mouth daily after breakfast.     . ondansetron (ZOFRAN) 8 MG tablet Take 1 tablet (8 mg total) by mouth 2 (two) times daily as needed (Nausea or vomiting). 30 tablet 1  . Oxycodone HCl 10 MG TABS Take 1 tablet (10 mg total) by mouth every 6 (six) hours as needed. 90 tablet 0  . pantoprazole (PROTONIX) 20 MG tablet TAKE 1 TABLET(20 MG) BY MOUTH DAILY 30 tablet 5  . polyethylene glycol (MIRALAX / GLYCOLAX) packet Take 17 g by mouth daily after breakfast.  (Patient not taking: Reported on 09/11/2020)    . potassium chloride SA (KLOR-CON) 20 MEQ tablet TAKE 1 TABLET(20 MEQ) BY MOUTH TWICE DAILY 180 tablet 0  . prochlorperazine (COMPAZINE) 10 MG tablet Take 1 tablet (10 mg total) by mouth every 6 (six) hours as needed (Nausea or vomiting). 30 tablet 1  . senna-docusate (SENNA S) 8.6-50 MG tablet Take 2 tablets by mouth at bedtime. 60 tablet 2  . sertraline (ZOLOFT) 50 MG tablet Take 1 tablet (50 mg total) by mouth daily. 90 tablet 3  . simvastatin (ZOCOR) 20 MG tablet TAKE 1 TABLET BY MOUTH EVERY DAY AT 6 PM 30 tablet 2  . Triamcinolone Acetonide 0.025 % LOTN Apply 1  application topically 3 (three) times daily as needed (rash/itching). 60 mL 2  . triamcinolone cream (KENALOG) 0.5 % Apply 1 application topically 4 (four) times daily. 90 g 1  . Vitamin D, Ergocalciferol, (DRISDOL) 1.25 MG (50000 UNIT) CAPS capsule TAKE 1 CAPSULE BY MOUTH EVERY 7 DAYS 12 capsule 0   No current facility-administered medications for this visit.   Facility-Administered Medications Ordered in Other Visits  Medication Dose Route Frequency Provider Last Rate Last Admin  . heparin lock flush 100 unit/mL  500 Units Intracatheter Once PRN Brunetta Genera, MD      . sodium chloride flush (NS) 0.9 % injection 10 mL  10 mL Intracatheter PRN Brunetta Genera, MD   10 mL at 10/02/19 1524    REVIEW OF SYSTEMS:   10 Point review of Systems was done is negative except as noted above.  PHYSICAL EXAMINATION: ECOG FS:2 - Symptomatic, <50% confined to bed  Vitals:   01/20/21 1048  BP: 140/65  Pulse: 79  Resp: 16  Temp: 98.7 F (37.1 C)  SpO2: 99%   Wt Readings from Last 3 Encounters:  01/06/21 156 lb 8 oz (71 kg)  12/23/20 157 lb 12.8 oz (71.6 kg)  12/09/20 158 lb 12 oz (72 kg)   Body mass index is 31.93 kg/m.    Exam was given in a chair.  GENERAL:alert, in no acute distress and comfortable SKIN: no acute rashes, no significant lesions EYES: conjunctiva are pink and non-injected, sclera anicteric OROPHARYNX: MMM, no exudates, no oropharyngeal erythema or ulceration NECK: supple, no JVD LYMPH:  no palpable lymphadenopathy in the cervical, axillary or inguinal regions LUNGS: clear to auscultation b/l with normal respiratory effort HEART: regular  rate & rhythm ABDOMEN:  normoactive bowel sounds , non tender, not distended. Extremity: no pedal edema PSYCH: alert & oriented x 3 with fluent speech NEURO: no focal motor/sensory deficits  LABORATORY DATA:  I have reviewed the data as listed  . CBC Latest Ref Rng & Units 01/20/2021 01/06/2021 12/23/2020  WBC 4.0 -  10.5 K/uL 4.2 3.9(L) 4.4  Hemoglobin 12.0 - 15.0 g/dL 12.4 11.8(L) 12.1  Hematocrit 36.0 - 46.0 % 38.0 35.1(L) 36.0  Platelets 150 - 400 K/uL 107(L) 158 110(L)   . CBC    Component Value Date/Time   WBC 3.9 (L) 01/06/2021 1015   RBC 3.48 (L) 01/06/2021 1015   HGB 11.8 (L) 01/06/2021 1015   HGB 12.8 02/16/2019 1138   HCT 35.1 (L) 01/06/2021 1015   PLT 158 01/06/2021 1015   PLT 170 02/16/2019 1138   MCV 100.9 (H) 01/06/2021 1015   MCH 33.9 01/06/2021 1015   MCHC 33.6 01/06/2021 1015   RDW 13.8 01/06/2021 1015   LYMPHSABS 1.0 01/06/2021 1015   MONOABS 0.7 01/06/2021 1015   EOSABS 0.1 01/06/2021 1015   BASOSABS 0.0 01/06/2021 1015    . CMP Latest Ref Rng & Units 01/20/2021 01/06/2021 12/23/2020  Glucose 70 - 99 mg/dL 159(H) 232(H) 231(H)  BUN 8 - 23 mg/dL _0 Creatinine 0.44 - 1.00 mg/dL 1.06(H) 1.12(H) 1.13(H)  Sodium 135 - 145 mmol/L 140 139 139  Potassium 3.5 - 5.1 mmol/L 3.4(L) 3.1(L) 3.4(L)  Chloride 98 - 111 mmol/L 109 110 106  CO2 22 - 32 mmol/L 20(L) 19(L) 20(L)  Calcium 8.9 - 10.3 mg/dL 9.5 9.1 9.1  Total Protein 6.5 - 8.1 g/dL 6.1(L) 6.2(L) 6.3(L)  Total Bilirubin 0.3 - 1.2 mg/dL 0.7 0.6 0.9  Alkaline Phos 38 - 126 U/L 84 84 72  AST 15 - 41 U/L 11(L) 10(L) 16  ALT 0 - 44 U/L _1 09/18/2019 BM Bx Report ([id number])   09/18/2019 FISH Panel    05/30/2019 BM Bx   01/06/2019 BM Bx:     01/06/19 Cytogenetics:      05/30/19 BM Biopsy:   09/18/2019 FISH Panel    09/18/2019 BM Surgical Pathology ([id number])     RADIOGRAPHIC STUDIES: I have personally reviewed the radiological images as listed and agreed with the findings in the report. MR Lumbar Spine W Wo Contrast  Result Date: 01/15/2021 CLINICAL DATA:  Multiple myeloma with new onset low back pain. Pain radiates to left lower extremity. EXAM: MRI LUMBAR SPINE WITHOUT AND WITH CONTRAST TECHNIQUE: Multiplanar and multiecho pulse sequences of the lumbar spine were obtained  without and with intravenous contrast. CONTRAST:  51m GADAVIST GADOBUTROL 1 MMOL/ML IV SOLN COMPARISON:  12/19/2019 FINDINGS: Segmentation:  Standard Alignment:  Grade 1 anterolisthesis at L4-5 Vertebrae: Mild bone marrow heterogeneity without focal lesion. No fracture or other acute finding. The sacrum is evaluated separately on a dedicated MRI. Conus medullaris and cauda equina: Conus extends to the L1 level. Conus and cauda equina appear normal. Paraspinal and other soft tissues: Negative. Disc levels: L1-L2: Normal disc space and facet joints. No spinal canal stenosis. No neural foraminal stenosis. L2-L3: Normal disc space and facet joints. No spinal canal stenosis. No neural foraminal stenosis. L3-L4: Normal disc space and facet joints. No spinal canal stenosis. No neural foraminal stenosis. L4-L5: Intermediate sized disc bulge with moderate facet hypertrophy, unchanged. Narrowing of both lateral recesses without central spinal canal stenosis. Mild bilateral neural foraminal stenosis. L5-S1: Intermediate sized disc bulge  with mild facet hypertrophy. No spinal canal stenosis. Moderate right and mild left neural foraminal stenosis, unchanged. IMPRESSION: 1. No pathologic fracture or focal bone marrow lesion of the lumbar spine. Please see dedicated report for MRI sacrum for discussion of the sacrum. 2. Unchanged examination of the lumbar spine with moderate right L5-S1 neural foraminal stenosis and mild bilateral L4-L5 neural foraminal stenosis. 3. No central spinal canal stenosis. Electronically Signed   By: Ulyses Jarred M.D.   On: 01/15/2021 20:03   MR SACRUM SI JOINTS W WO CONTRAST  Result Date: 01/16/2021 CLINICAL DATA:  Low back pain for the past month. History of multiple myeloma. EXAM: MRI SACRUM WITHOUT AND WITH CONTRAST TECHNIQUE: Multiplanar and multiecho pulse sequences of the sacrum were obtained without and with intravenous contrast. CONTRAST:  67m GADAVIST GADOBUTROL 1 MMOL/ML IV SOLN  COMPARISON:  PET-CT dated August 27, 2020. MRI lumbar spine dated December 19, 2019. FINDINGS: Bones/Joint/Cartilage Similar appearing small slightly expansile enhancing lesion in the S1 spinous process. No other focal bone lesion. Marrow signal remains heterogeneous. Evidence of prior bone marrow biopsy in the right iliac bone. The sacroiliac joints are unremarkable. Moderate L4-L5 facet arthropathy with associated degenerative perifacet marrow edema and enhancement. Muscles and Tendons Intact.  No muscle edema or atrophy. Soft tissue No fluid collection or hematoma.  No soft tissue mass. IMPRESSION: 1. Similar appearing small myeloma lesion in the S1 spinous process. No new lesion. 2. Moderate L4-L5 facet arthropathy with active degenerative inflammatory changes, which can be a source of lower back pain. Electronically Signed   By: WTitus DubinM.D.   On: 01/16/2021 08:42    ASSESSMENT & PLAN:   79y.o. female with  1. Recently diagnosed Multiple Myeloma, RISS Stage III  Labs upon initial presentation from 12/08/18, blood counts are normal including WBC at 7.1k, HGB at 13.1, and PLT at 245k. Calcium normal at 10.3. Creatinine normal at 0.63. M spike at 0.5g. 12/13/18 Bone Scan revealed Multifocal uptake throughout the skeleton, consistent with diffuse metastatic disease. Primary tumor is not specified. 2. Uptake in the proximal right femur, consistent with lytic lesions. 3. Uptake in the ribs bilaterally as described. 4. Lesions in the proximal left humerus. 5. Diffuse uptake throughout the skull consistent with metastatic disease. 6. Right paramedian uptake at the manubrium.  12/13/18 CT Right Femur revealed Numerous lytic lesions involving the right femur and a lytic lesion in the left inferior pubic ramus. Overall appearance is most concerning for multiple myeloma  12/27/18 Pretreatment 24hour UPEP observed an M spike at 156m and showed 19939motal protein/day.  12/27/18 Pretreatment MMP  revealed M Protein at 0.5g with IgG Lambda specificity. Kappa:Lambda light chain ratio at 0.13, with Lambda at 40.3. There is less abnormal protein and light chains than I would expect from 30% plasma cells, which suggests hypo-secretory or non-secretory neoplastic plasma cells. Will have an impact in assessing response. 01/05/19 PET/CT revealed Innumerable lytic lesions in the skeleton compatible with myeloma. Most of the larger lesions are hypermetabolic, for example including a left proximal humeral shaft lesion with maximum SUV of 8.1 and a 2.8 cm lesion in the left T9 vertebral body with maximum SUV 5.1. Most of the smaller lytic lesions, and some of the larger lesions, do not demonstrate accentuated metabolic activity. 2. 1.2 cm in short axis lymph node in the left parapharyngeal space is hypermetabolic with maximum SUV 11.8. I do not see a separate mass in the head and neck to give rise to this  hypermetabolic lymph node. 3. Mosaic attenuation in the lower lobes, nonspecific possibly from air trapping. 4.  Aortic Atherosclerosis 5. Heterogeneous activity in the liver, making it hard to exclude small liver lesions. Consider hepatic protocol MRI with and without contrast for definitive assessment. Nonobstructive right nephrolithiasis. Old granulomatous disease  01/06/19 Bone Marrow biopsy revealed interstitial increase in plasma cells (28% aspirate, 40% CD138 immunohistochemistry). Plasma cells negative for light chains consistent with a non or weakly secretory myeloma   01/06/19 Cytogenetics revealed 37% of cells with trisomy 11 or 11q deletion, and 40.5% of cells with 17p mutation  S/p 5 cycles of KRD treatment  05/31/19 BM Biopsy revealed mild atypical plasmacytosis at 5% with polytypic variation.   06/01/19 PET/CT revealed "Dominant lesion in the LEFT humerus is decreased significantly in metabolic activity. Additional hypermetabolic skeletal lytic lesions have decreased in metabolic activity or  similar to comparison exam (01/05/2019). No evidence of disease progression. 2. Multiple additional lytic lesions do not have metabolic activity and unchanged. 3. No new skeletal lesions are identified. No soft tissue plasmacytoma identified. 4. Nodule / node in the LEFT parapharyngeal space which is intensely hypermetabolic not changed from prior. 5. New hypermetabolic LEFT lower lobe pulmonary nodule is indeterminate. Recommend close attention on follow-up 6. New obstructive hydronephrosis of the RIGHT kidney related to RIGHT UPJ stone."  09/18/2019 BM Bx Report which revealed "Slightly hypercellular bone marrow for age with trilineage hematopoiesis and 1% plasma cells."  09/14/2019 PET/CT Whole Body Scan ([id number]) which revealed "1. There widespread tiny lytic lesions compatible with multiple myeloma. Index larger lesions are generally similar to the prior exam, with low-grade activity such as the left T9 vertebral body lesion with maximum SUV 4.5. Is mild increase in the activity associated with a mildly sclerotic left proximal humeral lesion, maximum SUV 4.8 (previously 3.5). 2. At the site of the prior left lower lobe nodule is currently more bandlike thickening, with maximum SUV only 1.9, probably benign, continued surveillance of this region suggested. 3. There several small but hypermetabolic lymph nodes. This includes a left parapharyngeal space node measuring 1.0 cm with maximum SUV 12.3 (stable); a left level IB lymph node measuring 0.5 cm with maximum SUV 4.8 (slightly larger than prior); and a left inguinal lymph node measuring 0.7 cm in short axis with maximum SUV 6.4 (previously 0.5 cm with maximum SUV 0.6). Significance of these lymph nodes uncertain, surveillance is recommended. 4. New 5 mm left lower lobe subpleural nodule on image 32/8, not appreciably hypermetabolic, surveillance suggested. 5. Focal subcutaneous stranding along the left perineum measuring about 2.6 by 1.1 cm on image  221/4, maximum SUV 12.5. This was not present previously and is most likely inflammatory, although given the notable SUV, surveillance of this region is suggested. 6. Other imaging findings of potential clinical significance: Aortic Atherosclerosis (ICD10-I70.0). Coronary atherosclerosis. Old granulomatous disease. Mild right hydronephrosis due to a 7 mm right UPJ calculus. 2 mm right kidney upper pole nonobstructive renal calculus. Prominent stool throughout the colon favors constipation."  12/19/2019 Thoracic & Lumbar Spine MRI ([id number]) ([id number]) revealed "Suspected myeloma lesions at T9 and S1. No compression deformity or epidural disease.  01/18/2020 PET/CT ([id number]) which revealed "1. Stable lytic lesions throughout the skeleton. The larger lytic lesions which had mild metabolic activity on comparison exam now have background metabolic activity. No evidence of active myeloma. No evidence of progression multiple myeloma.  No plasmacytoma 3. Hypermetabolic nodules in the LEFT neck may be associated deep tissues of the LEFT parotid gland.  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  01/17/19 MRI Liver revealed Several appreciable liver lesions all have benign imaging characteristics. No MRI findings of metastatic involvement of the liver. 2. Scattered bony lesions corresponding to the lytic lesions seen at PET-CT, compatible with active myeloma. 3. Aortic Atherosclerosis.  Mild cardiomegaly. 4. Diffuse hepatic steatosis.   3. Left lower lobe pulmonary nodule First seen on 06/01/19 PET/CT PET/CT 04/21/3266: No hypermetabolic mediastinal or hilar nodes. No suspicious pulmonary nodules on the CT scan.  4. 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 Being managed conservatively as per patient's preference.  No symptoms from this currently.  5. acute new lower back pain with tenderness to palpation in the lower back.  Rule out new myeloma lesions.  PLAN: -Discussed pt labwork today, 01/20/2021; blood counts stable. -Discussed pt MR Lumbar Spine wo contrast ([id number]) on 01/15/2021; no lesions or fractures. 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 -Advised pt we will need to repeat PET CT in late March/ early April at the six month mark since last.  -Advised pt she can use 159m Magesium Citrate if no bowel movements for 3 days despite use of Miralax/Senna. -The pt has no prohibitive toxicities from continuing C23D15 Carfilzomib at this time. -Will continue Carflizomib at 56 mg/m^2 q2weeks for maintenance. -Continue 15 mg Revlimid 3 weeks on, 1 week off for maintenance. No prohibitive toxicities at this time. -Continue 2.5 Eliquis BID for VTE prophylaxis -Continue Potassium daily  -Continue Zometa q4weeks -Will see back 6 weeks with labs. Following, will set up PET CT and Bm Bx.    FOLLOW UP: Plz schedule next 3 cycles (6 doses of Kyprolis) as per orders Portflush and labs with each treatment MD visit in 6 weeks   All of the patient's questions were answered with apparent satisfaction. The patient knows to call the clinic with any problems, questions or concerns.   GSullivan LoneMD MConneautAAHIVMS SInstituto De Gastroenter[facility] PrCOptim [facility] Center TattnallHematology/Oncology Physician [facility] (Office):       [phone](Work cell):  [phone](Fax):           [phone] 01/19/2021 9:44 AM  I, RReinaldo Raddle am acting as scribe for Dr. GSullivan Lone MD.     .I have reviewed the above documentation for accuracy and completeness, and I agree with the above. .Brunetta GeneraMD

## 2021-01-20 ENCOUNTER — Inpatient Hospital Stay: Payer: Medicare PPO

## 2021-01-20 ENCOUNTER — Ambulatory Visit: Payer: Medicare PPO | Admitting: Hematology

## 2021-01-20 ENCOUNTER — Other Ambulatory Visit: Payer: Medicare PPO

## 2021-01-20 ENCOUNTER — Other Ambulatory Visit: Payer: Self-pay | Admitting: Hematology

## 2021-01-20 ENCOUNTER — Ambulatory Visit: Payer: Medicare PPO

## 2021-01-20 ENCOUNTER — Inpatient Hospital Stay: Payer: Medicare PPO | Attending: Hematology

## 2021-01-20 ENCOUNTER — Other Ambulatory Visit: Payer: Self-pay

## 2021-01-20 ENCOUNTER — Inpatient Hospital Stay (HOSPITAL_BASED_OUTPATIENT_CLINIC_OR_DEPARTMENT_OTHER): Payer: Medicare PPO | Admitting: Hematology

## 2021-01-20 VITALS — BP 140/65 | HR 79 | Temp 98.7°F | Resp 16 | Ht 59.0 in | Wt 158.1 lb

## 2021-01-20 DIAGNOSIS — Z5112 Encounter for antineoplastic immunotherapy: Secondary | ICD-10-CM | POA: Insufficient documentation

## 2021-01-20 DIAGNOSIS — C9000 Multiple myeloma not having achieved remission: Secondary | ICD-10-CM

## 2021-01-20 DIAGNOSIS — Z5111 Encounter for antineoplastic chemotherapy: Secondary | ICD-10-CM | POA: Diagnosis not present

## 2021-01-20 DIAGNOSIS — Z95828 Presence of other vascular implants and grafts: Secondary | ICD-10-CM

## 2021-01-20 DIAGNOSIS — Z7189 Other specified counseling: Secondary | ICD-10-CM

## 2021-01-20 DIAGNOSIS — Z79899 Other long term (current) drug therapy: Secondary | ICD-10-CM | POA: Insufficient documentation

## 2021-01-20 LAB — CMP (CANCER CENTER ONLY)
ALT: 16 U/L (ref 0–44)
AST: 11 U/L — ABNORMAL LOW (ref 15–41)
Albumin: 3.5 g/dL (ref 3.5–5.0)
Alkaline Phosphatase: 84 U/L (ref 38–126)
Anion gap: 11 (ref 5–15)
BUN: 11 mg/dL (ref 8–23)
CO2: 20 mmol/L — ABNORMAL LOW (ref 22–32)
Calcium: 9.5 mg/dL (ref 8.9–10.3)
Chloride: 109 mmol/L (ref 98–111)
Creatinine: 1.06 mg/dL — ABNORMAL HIGH (ref 0.44–1.00)
GFR, Estimated: 54 mL/min — ABNORMAL LOW (ref >60)
Glucose, Bld: 159 mg/dL — ABNORMAL HIGH (ref 70–99)
Potassium: 3.4 mmol/L — ABNORMAL LOW (ref 3.5–5.1)
Sodium: 140 mmol/L (ref 135–145)
Total Bilirubin: 0.7 mg/dL (ref 0.3–1.2)
Total Protein: 6.1 g/dL — ABNORMAL LOW (ref 6.5–8.1)

## 2021-01-20 LAB — CBC WITH DIFFERENTIAL/PLATELET
Abs Immature Granulocytes: 0.02 10*3/uL (ref 0.00–0.07)
Basophils Absolute: 0.1 10*3/uL (ref 0.0–0.1)
Basophils Relative: 1 %
Eosinophils Absolute: 0.2 10*3/uL (ref 0.0–0.5)
Eosinophils Relative: 6 %
HCT: 38 % (ref 36.0–46.0)
Hemoglobin: 12.4 g/dL (ref 12.0–15.0)
Immature Granulocytes: 1 %
Lymphocytes Relative: 21 %
Lymphs Abs: 0.9 10*3/uL (ref 0.7–4.0)
MCH: 33.9 pg (ref 26.0–34.0)
MCHC: 32.6 g/dL (ref 30.0–36.0)
MCV: 103.8 fL — ABNORMAL HIGH (ref 80.0–100.0)
Monocytes Absolute: 0.9 10*3/uL (ref 0.1–1.0)
Monocytes Relative: 22 %
Neutro Abs: 2.1 10*3/uL (ref 1.7–7.7)
Neutrophils Relative %: 49 %
Platelets: 107 10*3/uL — ABNORMAL LOW (ref 150–400)
RBC: 3.66 MIL/uL — ABNORMAL LOW (ref 3.87–5.11)
RDW: 14.5 % (ref 11.5–15.5)
WBC: 4.2 10*3/uL (ref 4.0–10.5)
nRBC: 0 % (ref 0.0–0.2)

## 2021-01-20 MED ORDER — DIPHENHYDRAMINE HCL 25 MG PO CAPS
ORAL_CAPSULE | ORAL | Status: AC
  Filled 2021-01-20: qty 1

## 2021-01-20 MED ORDER — FAMOTIDINE IN NACL 20-0.9 MG/50ML-% IV SOLN
INTRAVENOUS | Status: AC
  Filled 2021-01-20: qty 50

## 2021-01-20 MED ORDER — SODIUM CHLORIDE 0.9% FLUSH
10.0000 mL | Freq: Once | INTRAVENOUS | Status: AC
  Administered 2021-01-20: 10 mL
  Filled 2021-01-20: qty 10

## 2021-01-20 MED ORDER — ZOLEDRONIC ACID 4 MG/100ML IV SOLN
INTRAVENOUS | Status: AC
  Filled 2021-01-20: qty 100

## 2021-01-20 MED ORDER — FAMOTIDINE 20 MG PO TABS
20.0000 mg | ORAL_TABLET | Freq: Once | ORAL | Status: AC
  Administered 2021-01-20: 20 mg via ORAL

## 2021-01-20 MED ORDER — DEXTROSE 5 % IV SOLN
56.0000 mg/m2 | Freq: Once | INTRAVENOUS | Status: AC
  Administered 2021-01-20: 100 mg via INTRAVENOUS
  Filled 2021-01-20: qty 30

## 2021-01-20 MED ORDER — ACETAMINOPHEN 325 MG PO TABS
650.0000 mg | ORAL_TABLET | Freq: Once | ORAL | Status: AC
  Administered 2021-01-20: 650 mg via ORAL

## 2021-01-20 MED ORDER — DEXAMETHASONE 4 MG PO TABS
12.0000 mg | ORAL_TABLET | Freq: Once | ORAL | Status: AC
  Administered 2021-01-20: 12 mg via ORAL

## 2021-01-20 MED ORDER — PROCHLORPERAZINE MALEATE 10 MG PO TABS
10.0000 mg | ORAL_TABLET | Freq: Once | ORAL | Status: AC
  Administered 2021-01-20: 10 mg via ORAL

## 2021-01-20 MED ORDER — HEPARIN SOD (PORK) LOCK FLUSH 100 UNIT/ML IV SOLN
500.0000 [IU] | Freq: Once | INTRAVENOUS | Status: AC | PRN
  Administered 2021-01-20: 500 [IU]
  Filled 2021-01-20: qty 5

## 2021-01-20 MED ORDER — ACETAMINOPHEN 325 MG PO TABS
ORAL_TABLET | ORAL | Status: AC
  Filled 2021-01-20: qty 2

## 2021-01-20 MED ORDER — FAMOTIDINE 20 MG PO TABS
ORAL_TABLET | ORAL | Status: AC
  Filled 2021-01-20: qty 1

## 2021-01-20 MED ORDER — PROCHLORPERAZINE MALEATE 10 MG PO TABS
ORAL_TABLET | ORAL | Status: AC
  Filled 2021-01-20: qty 1

## 2021-01-20 MED ORDER — ZOLEDRONIC ACID 4 MG/100ML IV SOLN
4.0000 mg | Freq: Once | INTRAVENOUS | Status: AC
  Administered 2021-01-20: 4 mg via INTRAVENOUS

## 2021-01-20 MED ORDER — SODIUM CHLORIDE 0.9% FLUSH
10.0000 mL | INTRAVENOUS | Status: DC | PRN
  Administered 2021-01-20: 10 mL
  Filled 2021-01-20: qty 10

## 2021-01-20 MED ORDER — DIPHENHYDRAMINE HCL 25 MG PO CAPS
25.0000 mg | ORAL_CAPSULE | Freq: Once | ORAL | Status: AC
  Administered 2021-01-20: 25 mg via ORAL

## 2021-01-20 MED ORDER — SODIUM CHLORIDE 0.9 % IV SOLN
Freq: Once | INTRAVENOUS | Status: AC
  Filled 2021-01-20: qty 250

## 2021-01-20 MED ORDER — DEXAMETHASONE 4 MG PO TABS
ORAL_TABLET | ORAL | Status: AC
  Filled 2021-01-20: qty 3

## 2021-01-20 NOTE — Patient Instructions (Signed)

## 2021-01-20 NOTE — Patient Instructions (Signed)
[facility] Discharge Instructions for Patients Receiving Chemotherapy  Today you received the following chemotherapy agents: Kyprolis   To help prevent nausea and vomiting after your treatment, we encourage you to take your nausea medication as directed.   If you develop nausea and vomiting that is not controlled by your nausea medication, call the clinic.   BELOW ARE SYMPTOMS THAT SHOULD BE REPORTED IMMEDIATELY:  *FEVER GREATER THAN 100.5 F  *CHILLS WITH OR WITHOUT FEVER  NAUSEA AND VOMITING THAT IS NOT CONTROLLED WITH YOUR NAUSEA MEDICATION  *UNUSUAL SHORTNESS OF BREATH  *UNUSUAL BRUISING OR BLEEDING  TENDERNESS IN MOUTH AND THROAT WITH OR WITHOUT PRESENCE OF ULCERS  *URINARY PROBLEMS  *BOWEL PROBLEMS  UNUSUAL RASH Items with * indicate a potential emergency and should be followed up as soon as possible.  Feel free to call the clinic should you have any questions or concerns. The clinic phone number is [phone].  Please show the CHEMO ALERT CARD at check-in to the Emergency Department and triage nurse.  Zoledronic Acid Injection (Hypercalcemia, Oncology) What is this medicine? ZOLEDRONIC ACID (ZOE le dron ik AS id) slows calcium loss from bones. It high calcium levels in the blood from some kinds of cancer. It may be used in other people at risk for bone loss. This medicine may be used for other purposes; ask your health care provider or pharmacist if you have questions. COMMON BRAND NAME(S): Zometa What should I tell my health care provider before I take this medicine? They need to know if you have any of these conditions:  cancer  dehydration  dental disease  kidney disease  liver disease  low levels of calcium in the blood  lung or breathing disease (asthma)  receiving steroids like dexamethasone or prednisone  an unusual or allergic reaction to zoledronic acid, other medicines, foods, dyes, or preservatives  pregnant or trying to  get pregnant  breast-feeding How should I use this medicine? This drug is injected into a vein. It is given by a health care provider in a hospital or clinic setting. Talk to your health care provider about the use of this drug in children. Special care may be needed. Overdosage: If you think you have taken too much of this medicine contact a poison control center or emergency room at once. NOTE: This medicine is only for you. Do not share this medicine with others. What if I miss a dose? Keep appointments for follow-up doses. It is important not to miss your dose. Call your health care provider if you are unable to keep an appointment. What may interact with this medicine?  certain antibiotics given by injection  NSAIDs, medicines for pain and inflammation, like ibuprofen or naproxen  some diuretics like bumetanide, furosemide  teriparatide  thalidomide This list may not describe all possible interactions. Give your health care provider a list of all the medicines, herbs, non-prescription drugs, or dietary supplements you use. Also tell them if you smoke, drink alcohol, or use illegal drugs. Some items may interact with your medicine. What should I watch for while using this medicine? Visit your health care provider for regular checks on your progress. It may be some time before you see the benefit from this drug. Some people who take this drug have severe bone, joint, or muscle pain. This drug may also increase your risk for jaw problems or a broken thigh bone. Tell your health care provider right away if you have severe pain in your jaw, bones,   joints, or muscles. Tell you health care provider if you have any pain that does not go away or that gets worse. Tell your dentist and dental surgeon that you are taking this drug. You should not have major dental surgery while on this drug. See your dentist to have a dental exam and fix any dental problems before starting this drug. Take good care  of your teeth while on this drug. Make sure you see your dentist for regular follow-up appointments. You should make sure you get enough calcium and vitamin D while you are taking this drug. Discuss the foods you eat and the vitamins you take with your health care provider. Check with your health care provider if you have severe diarrhea, nausea, and vomiting, or if you sweat a lot. The loss of too much body fluid may make it dangerous for you to take this drug. You may need blood work done while you are taking this drug. Do not become pregnant while taking this drug. Women should inform their health care provider if they wish to become pregnant or think they might be pregnant. There is potential for serious harm to an unborn child. Talk to your health care provider for more information. What side effects may I notice from receiving this medicine? Side effects that you should report to your doctor or health care provider as soon as possible:  allergic reactions (skin rash, itching or hives; swelling of the face, lips, or tongue)  bone pain  infection (fever, chills, cough, sore throat, pain or trouble passing urine)  jaw pain, especially after dental work  joint pain  kidney injury (trouble passing urine or change in the amount of urine)  low blood pressure (dizziness; feeling faint or lightheaded, falls; unusually weak or tired)  low calcium levels (fast heartbeat; muscle cramps or pain; pain, tingling, or numbness in the hands or feet; seizures)  low magnesium levels (fast, irregular heartbeat; muscle cramp or pain; muscle weakness; tremors; seizures)  low red blood cell counts (trouble breathing; feeling faint; lightheaded, falls; unusually weak or tired)  muscle pain  redness, blistering, peeling, or loosening of the skin, including inside the mouth  severe diarrhea  swelling of the ankles, feet, hands  trouble breathing Side effects that usually do not require medical  attention (report to your doctor or health care provider if they continue or are bothersome):  anxious  constipation  coughing  depressed mood  eye irritation, itching, or pain  fever  general ill feeling or flu-like symptoms  nausea  pain, redness, or irritation at site where injected  trouble sleeping This list may not describe all possible side effects. Call your doctor for medical advice about side effects. You may report side effects to FDA at 1-800-FDA-1088. Where should I keep my medicine? This drug is given in a hospital or clinic. It will not be stored at home. NOTE: This sheet is a summary. It may not cover all possible information. If you have questions about this medicine, talk to your doctor, pharmacist, or health care provider.  2021 [employer]/Gold Standard (2019-09-14 09:13:00)  

## 2021-01-20 NOTE — Telephone Encounter (Signed)
RX requires new [employer] auth#. Earliest to obtain via [employer] 01/22/21

## 2021-01-28 ENCOUNTER — Encounter: Payer: Self-pay | Admitting: Gastroenterology

## 2021-01-29 ENCOUNTER — Encounter: Payer: Self-pay | Admitting: Hematology

## 2021-01-30 ENCOUNTER — Other Ambulatory Visit: Payer: Self-pay | Admitting: *Deleted

## 2021-01-30 DIAGNOSIS — C9000 Multiple myeloma not having achieved remission: Secondary | ICD-10-CM

## 2021-01-30 MED ORDER — FENTANYL 12 MCG/HR TD PT72: 1.0000 | MEDICATED_PATCH | TRANSDERMAL | 0 refills | Status: DC

## 2021-02-03 ENCOUNTER — Inpatient Hospital Stay: Payer: Medicare PPO

## 2021-02-03 ENCOUNTER — Other Ambulatory Visit: Payer: Self-pay

## 2021-02-03 VITALS — BP 135/76 | HR 76 | Temp 98.3°F | Resp 18

## 2021-02-03 DIAGNOSIS — Z95828 Presence of other vascular implants and grafts: Secondary | ICD-10-CM

## 2021-02-03 DIAGNOSIS — C9000 Multiple myeloma not having achieved remission: Secondary | ICD-10-CM

## 2021-02-03 DIAGNOSIS — Z5111 Encounter for antineoplastic chemotherapy: Secondary | ICD-10-CM

## 2021-02-03 DIAGNOSIS — Z5112 Encounter for antineoplastic immunotherapy: Secondary | ICD-10-CM | POA: Diagnosis not present

## 2021-02-03 DIAGNOSIS — Z79899 Other long term (current) drug therapy: Secondary | ICD-10-CM | POA: Diagnosis not present

## 2021-02-03 DIAGNOSIS — Z7189 Other specified counseling: Secondary | ICD-10-CM

## 2021-02-03 LAB — CBC WITH DIFFERENTIAL/PLATELET
Abs Immature Granulocytes: 0.01 10*3/uL (ref 0.00–0.07)
Basophils Absolute: 0 10*3/uL (ref 0.0–0.1)
Basophils Relative: 1 %
Eosinophils Absolute: 0.1 10*3/uL (ref 0.0–0.5)
Eosinophils Relative: 1 %
HCT: 34.7 % — ABNORMAL LOW (ref 36.0–46.0)
Hemoglobin: 12.1 g/dL (ref 12.0–15.0)
Immature Granulocytes: 0 %
Lymphocytes Relative: 26 %
Lymphs Abs: 1 10*3/uL (ref 0.7–4.0)
MCH: 34.8 pg — ABNORMAL HIGH (ref 26.0–34.0)
MCHC: 34.9 g/dL (ref 30.0–36.0)
MCV: 99.7 fL (ref 80.0–100.0)
Monocytes Absolute: 0.6 10*3/uL (ref 0.1–1.0)
Monocytes Relative: 17 %
Neutro Abs: 2 10*3/uL (ref 1.7–7.7)
Neutrophils Relative %: 55 %
Platelets: 132 10*3/uL — ABNORMAL LOW (ref 150–400)
RBC: 3.48 MIL/uL — ABNORMAL LOW (ref 3.87–5.11)
RDW: 14.7 % (ref 11.5–15.5)
WBC: 3.7 10*3/uL — ABNORMAL LOW (ref 4.0–10.5)
nRBC: 0 % (ref 0.0–0.2)

## 2021-02-03 LAB — CMP (CANCER CENTER ONLY)
ALT: 13 U/L (ref 0–44)
AST: 9 U/L — ABNORMAL LOW (ref 15–41)
Albumin: 3.4 g/dL — ABNORMAL LOW (ref 3.5–5.0)
Alkaline Phosphatase: 90 U/L (ref 38–126)
Anion gap: 10 (ref 5–15)
BUN: 10 mg/dL (ref 8–23)
CO2: 19 mmol/L — ABNORMAL LOW (ref 22–32)
Calcium: 9 mg/dL (ref 8.9–10.3)
Chloride: 110 mmol/L (ref 98–111)
Creatinine: 1.03 mg/dL — ABNORMAL HIGH (ref 0.44–1.00)
GFR, Estimated: 56 mL/min — ABNORMAL LOW (ref >60)
Glucose, Bld: 196 mg/dL — ABNORMAL HIGH (ref 70–99)
Potassium: 2.9 mmol/L — ABNORMAL LOW (ref 3.5–5.1)
Sodium: 139 mmol/L (ref 135–145)
Total Bilirubin: 0.5 mg/dL (ref 0.3–1.2)
Total Protein: 6.2 g/dL — ABNORMAL LOW (ref 6.5–8.1)

## 2021-02-03 MED ORDER — PROCHLORPERAZINE MALEATE 10 MG PO TABS
ORAL_TABLET | ORAL | Status: AC
  Filled 2021-02-03: qty 1

## 2021-02-03 MED ORDER — SODIUM CHLORIDE 0.9 % IV SOLN
Freq: Once | INTRAVENOUS | Status: AC
  Filled 2021-02-03: qty 250

## 2021-02-03 MED ORDER — ACETAMINOPHEN 325 MG PO TABS
ORAL_TABLET | ORAL | Status: AC
  Filled 2021-02-03: qty 2

## 2021-02-03 MED ORDER — FAMOTIDINE 20 MG PO TABS
20.0000 mg | ORAL_TABLET | Freq: Once | ORAL | Status: AC
  Administered 2021-02-03: 20 mg via ORAL

## 2021-02-03 MED ORDER — FAMOTIDINE 20 MG PO TABS
ORAL_TABLET | ORAL | Status: AC
  Filled 2021-02-03: qty 1

## 2021-02-03 MED ORDER — SODIUM CHLORIDE 0.9% FLUSH
10.0000 mL | Freq: Once | INTRAVENOUS | Status: AC
  Administered 2021-02-03: 10 mL
  Filled 2021-02-03: qty 10

## 2021-02-03 MED ORDER — PROCHLORPERAZINE MALEATE 10 MG PO TABS
10.0000 mg | ORAL_TABLET | Freq: Once | ORAL | Status: AC
  Administered 2021-02-03: 10 mg via ORAL

## 2021-02-03 MED ORDER — HEPARIN SOD (PORK) LOCK FLUSH 100 UNIT/ML IV SOLN
500.0000 [IU] | Freq: Once | INTRAVENOUS | Status: AC | PRN
  Administered 2021-02-03: 500 [IU]
  Filled 2021-02-03: qty 5

## 2021-02-03 MED ORDER — DEXTROSE 5 % IV SOLN: 56.0000 mg/m2 | Freq: Once | INTRAVENOUS | Status: DC

## 2021-02-03 MED ORDER — DIPHENHYDRAMINE HCL 25 MG PO CAPS
ORAL_CAPSULE | ORAL | Status: AC
  Filled 2021-02-03: qty 1

## 2021-02-03 MED ORDER — DEXAMETHASONE 4 MG PO TABS
ORAL_TABLET | ORAL | Status: AC
  Filled 2021-02-03: qty 3

## 2021-02-03 MED ORDER — SODIUM CHLORIDE 0.9% FLUSH
10.0000 mL | INTRAVENOUS | Status: DC | PRN
  Administered 2021-02-03: 10 mL
  Filled 2021-02-03: qty 10

## 2021-02-03 MED ORDER — DEXAMETHASONE 4 MG PO TABS
12.0000 mg | ORAL_TABLET | Freq: Once | ORAL | Status: AC
  Administered 2021-02-03: 12 mg via ORAL

## 2021-02-03 MED ORDER — DEXTROSE 5 % IV SOLN
56.0000 mg/m2 | Freq: Once | INTRAVENOUS | Status: AC
  Administered 2021-02-03: 100 mg via INTRAVENOUS
  Filled 2021-02-03: qty 5

## 2021-02-03 MED ORDER — ACETAMINOPHEN 325 MG PO TABS
650.0000 mg | ORAL_TABLET | Freq: Once | ORAL | Status: AC
Start: 2021-02-03 — End: 2021-02-03
  Administered 2021-02-03: 650 mg via ORAL

## 2021-02-03 MED ORDER — DIPHENHYDRAMINE HCL 25 MG PO TABS
25.0000 mg | ORAL_TABLET | Freq: Once | ORAL | Status: AC
Start: 2021-02-03 — End: 2021-02-03
  Administered 2021-02-03: 25 mg via ORAL
  Filled 2021-02-03: qty 1

## 2021-02-03 NOTE — Patient Instructions (Signed)

## 2021-02-03 NOTE — Patient Instructions (Signed)
[facility] Discharge Instructions for Patients Receiving Chemotherapy  Today you received the following chemotherapy agents:  Kyprolis  To help prevent nausea and vomiting after your treatment, we encourage you to take your nausea medication as directed.   If you develop nausea and vomiting that is not controlled by your nausea medication, call the clinic.   BELOW ARE SYMPTOMS THAT SHOULD BE REPORTED IMMEDIATELY:  *FEVER GREATER THAN 100.5 F  *CHILLS WITH OR WITHOUT FEVER  NAUSEA AND VOMITING THAT IS NOT CONTROLLED WITH YOUR NAUSEA MEDICATION  *UNUSUAL SHORTNESS OF BREATH  *UNUSUAL BRUISING OR BLEEDING  TENDERNESS IN MOUTH AND THROAT WITH OR WITHOUT PRESENCE OF ULCERS  *URINARY PROBLEMS  *BOWEL PROBLEMS  UNUSUAL RASH Items with * indicate a potential emergency and should be followed up as soon as possible.  Feel free to call the clinic should you have any questions or concerns. The clinic phone number is [phone].  Please show the CHEMO ALERT CARD at check-in to the Emergency Department and triage nurse.   

## 2021-02-11 ENCOUNTER — Encounter: Payer: Self-pay | Admitting: Hematology

## 2021-02-12 ENCOUNTER — Other Ambulatory Visit: Payer: Self-pay | Admitting: *Deleted

## 2021-02-12 ENCOUNTER — Encounter: Payer: Self-pay | Admitting: Hematology

## 2021-02-12 DIAGNOSIS — C9000 Multiple myeloma not having achieved remission: Secondary | ICD-10-CM

## 2021-02-12 MED ORDER — OXYCODONE HCL 10 MG PO TABS: 10.0000 mg | ORAL_TABLET | Freq: Four times a day (QID) | ORAL | 0 refills | Status: DC | PRN

## 2021-02-12 NOTE — Telephone Encounter (Signed)
Patient requested refill of oxycodone 

## 2021-02-13 ENCOUNTER — Telehealth: Payer: Self-pay | Admitting: Hematology

## 2021-02-13 NOTE — Telephone Encounter (Signed)
Called pt per 3/2 sch msg - no answer. Left message for patient to call back if still needed reschedule.

## 2021-02-22 ENCOUNTER — Other Ambulatory Visit: Payer: Self-pay | Admitting: Hematology

## 2021-02-22 DIAGNOSIS — C9000 Multiple myeloma not having achieved remission: Secondary | ICD-10-CM

## 2021-02-24 ENCOUNTER — Inpatient Hospital Stay: Payer: Medicare PPO

## 2021-02-24 ENCOUNTER — Inpatient Hospital Stay: Payer: Medicare PPO | Attending: Hematology

## 2021-02-24 ENCOUNTER — Other Ambulatory Visit: Payer: Self-pay

## 2021-02-24 VITALS — BP 119/66 | HR 66 | Temp 97.9°F | Resp 18

## 2021-02-24 DIAGNOSIS — Z5112 Encounter for antineoplastic immunotherapy: Secondary | ICD-10-CM | POA: Diagnosis not present

## 2021-02-24 DIAGNOSIS — C9000 Multiple myeloma not having achieved remission: Secondary | ICD-10-CM

## 2021-02-24 DIAGNOSIS — Z95828 Presence of other vascular implants and grafts: Secondary | ICD-10-CM

## 2021-02-24 DIAGNOSIS — Z7189 Other specified counseling: Secondary | ICD-10-CM

## 2021-02-24 DIAGNOSIS — Z79899 Other long term (current) drug therapy: Secondary | ICD-10-CM | POA: Insufficient documentation

## 2021-02-24 DIAGNOSIS — Z5111 Encounter for antineoplastic chemotherapy: Secondary | ICD-10-CM

## 2021-02-24 LAB — CMP (CANCER CENTER ONLY)
ALT: 15 U/L (ref 0–44)
AST: 12 U/L — ABNORMAL LOW (ref 15–41)
Albumin: 3.3 g/dL — ABNORMAL LOW (ref 3.5–5.0)
Alkaline Phosphatase: 90 U/L (ref 38–126)
Anion gap: 8 (ref 5–15)
BUN: 13 mg/dL (ref 8–23)
CO2: 21 mmol/L — ABNORMAL LOW (ref 22–32)
Calcium: 9 mg/dL (ref 8.9–10.3)
Chloride: 107 mmol/L (ref 98–111)
Creatinine: 0.98 mg/dL (ref 0.44–1.00)
GFR, Estimated: 59 mL/min — ABNORMAL LOW (ref >60)
Glucose, Bld: 198 mg/dL — ABNORMAL HIGH (ref 70–99)
Potassium: 3.7 mmol/L (ref 3.5–5.1)
Sodium: 136 mmol/L (ref 135–145)
Total Bilirubin: 0.7 mg/dL (ref 0.3–1.2)
Total Protein: 5.9 g/dL — ABNORMAL LOW (ref 6.5–8.1)

## 2021-02-24 LAB — CBC WITH DIFFERENTIAL/PLATELET
Abs Immature Granulocytes: 0.02 10*3/uL (ref 0.00–0.07)
Basophils Absolute: 0 10*3/uL (ref 0.0–0.1)
Basophils Relative: 1 %
Eosinophils Absolute: 0.2 10*3/uL (ref 0.0–0.5)
Eosinophils Relative: 7 %
HCT: 36 % (ref 36.0–46.0)
Hemoglobin: 12.1 g/dL (ref 12.0–15.0)
Immature Granulocytes: 1 %
Lymphocytes Relative: 32 %
Lymphs Abs: 1.1 10*3/uL (ref 0.7–4.0)
MCH: 34.5 pg — ABNORMAL HIGH (ref 26.0–34.0)
MCHC: 33.6 g/dL (ref 30.0–36.0)
MCV: 102.6 fL — ABNORMAL HIGH (ref 80.0–100.0)
Monocytes Absolute: 0.8 10*3/uL (ref 0.1–1.0)
Monocytes Relative: 23 %
Neutro Abs: 1.2 10*3/uL — ABNORMAL LOW (ref 1.7–7.7)
Neutrophils Relative %: 36 %
Platelets: 91 10*3/uL — ABNORMAL LOW (ref 150–400)
RBC: 3.51 MIL/uL — ABNORMAL LOW (ref 3.87–5.11)
RDW: 14 % (ref 11.5–15.5)
WBC: 3.3 10*3/uL — ABNORMAL LOW (ref 4.0–10.5)
nRBC: 0 % (ref 0.0–0.2)

## 2021-02-24 MED ORDER — HEPARIN SOD (PORK) LOCK FLUSH 100 UNIT/ML IV SOLN
500.0000 [IU] | Freq: Once | INTRAVENOUS | Status: AC | PRN
  Administered 2021-02-24: 500 [IU]
  Filled 2021-02-24: qty 5

## 2021-02-24 MED ORDER — ACETAMINOPHEN 325 MG PO TABS
650.0000 mg | ORAL_TABLET | Freq: Once | ORAL | Status: AC
  Administered 2021-02-24: 650 mg via ORAL

## 2021-02-24 MED ORDER — ACETAMINOPHEN 325 MG PO TABS
ORAL_TABLET | ORAL | Status: AC
  Filled 2021-02-24: qty 2

## 2021-02-24 MED ORDER — DEXTROSE 5 % IV SOLN
56.0000 mg/m2 | Freq: Once | INTRAVENOUS | Status: AC
  Administered 2021-02-24: 100 mg via INTRAVENOUS
  Filled 2021-02-24: qty 5

## 2021-02-24 MED ORDER — SODIUM CHLORIDE 0.9% FLUSH
10.0000 mL | INTRAVENOUS | Status: DC | PRN
  Administered 2021-02-24: 10 mL
  Filled 2021-02-24: qty 10

## 2021-02-24 MED ORDER — SODIUM CHLORIDE 0.9% FLUSH
10.0000 mL | Freq: Once | INTRAVENOUS | Status: AC
  Administered 2021-02-24: 10 mL
  Filled 2021-02-24: qty 10

## 2021-02-24 MED ORDER — PROCHLORPERAZINE MALEATE 10 MG PO TABS
10.0000 mg | ORAL_TABLET | Freq: Once | ORAL | Status: AC
  Administered 2021-02-24: 10 mg via ORAL

## 2021-02-24 MED ORDER — DEXAMETHASONE 4 MG PO TABS
12.0000 mg | ORAL_TABLET | Freq: Once | ORAL | Status: AC
  Administered 2021-02-24: 12 mg via ORAL

## 2021-02-24 MED ORDER — PROCHLORPERAZINE MALEATE 10 MG PO TABS
ORAL_TABLET | ORAL | Status: AC
  Filled 2021-02-24: qty 1

## 2021-02-24 MED ORDER — DIPHENHYDRAMINE HCL 25 MG PO CAPS
ORAL_CAPSULE | ORAL | Status: AC
  Filled 2021-02-24: qty 1

## 2021-02-24 MED ORDER — SODIUM CHLORIDE 0.9 % IV SOLN
Freq: Once | INTRAVENOUS | Status: AC
Start: 2021-02-24 — End: 2021-02-24
  Filled 2021-02-24: qty 250

## 2021-02-24 MED ORDER — DEXAMETHASONE 4 MG PO TABS
ORAL_TABLET | ORAL | Status: AC
  Filled 2021-02-24: qty 3

## 2021-02-24 MED ORDER — FAMOTIDINE 20 MG PO TABS
ORAL_TABLET | ORAL | Status: AC
  Filled 2021-02-24: qty 1

## 2021-02-24 MED ORDER — DIPHENHYDRAMINE HCL 25 MG PO TABS
25.0000 mg | ORAL_TABLET | Freq: Once | ORAL | Status: AC
  Administered 2021-02-24: 25 mg via ORAL
  Filled 2021-02-24: qty 1

## 2021-02-24 MED ORDER — SODIUM CHLORIDE 0.9 % IV SOLN
Freq: Once | INTRAVENOUS | Status: DC
  Filled 2021-02-24: qty 250

## 2021-02-24 MED ORDER — ZOLEDRONIC ACID 4 MG/100ML IV SOLN
INTRAVENOUS | Status: AC
  Filled 2021-02-24: qty 100

## 2021-02-24 MED ORDER — FAMOTIDINE 20 MG PO TABS
20.0000 mg | ORAL_TABLET | Freq: Once | ORAL | Status: AC
Start: 2021-02-24 — End: 2021-02-24
  Administered 2021-02-24: 20 mg via ORAL

## 2021-02-24 MED ORDER — ZOLEDRONIC ACID 4 MG/100ML IV SOLN
4.0000 mg | Freq: Once | INTRAVENOUS | Status: AC
  Administered 2021-02-24: 4 mg via INTRAVENOUS

## 2021-02-24 NOTE — Patient Instructions (Signed)
[facility] Discharge Instructions for Patients Receiving Chemotherapy  Today you received the following chemotherapy agents:  Kyprolis  To help prevent nausea and vomiting after your treatment, we encourage you to take your nausea medication as directed.   If you develop nausea and vomiting that is not controlled by your nausea medication, call the clinic.   BELOW ARE SYMPTOMS THAT SHOULD BE REPORTED IMMEDIATELY:  *FEVER GREATER THAN 100.5 F  *CHILLS WITH OR WITHOUT FEVER  NAUSEA AND VOMITING THAT IS NOT CONTROLLED WITH YOUR NAUSEA MEDICATION  *UNUSUAL SHORTNESS OF BREATH  *UNUSUAL BRUISING OR BLEEDING  TENDERNESS IN MOUTH AND THROAT WITH OR WITHOUT PRESENCE OF ULCERS  *URINARY PROBLEMS  *BOWEL PROBLEMS  UNUSUAL RASH Items with * indicate a potential emergency and should be followed up as soon as possible.  Feel free to call the clinic should you have any questions or concerns. The clinic phone number is [phone].  Please show the CHEMO ALERT CARD at check-in to the Emergency Department and triage nurse.   

## 2021-02-24 NOTE — Progress Notes (Signed)
Ok to treat with plts of 91 and ANC of 1.2 per Dr.Kale.

## 2021-03-03 ENCOUNTER — Other Ambulatory Visit: Payer: Medicare PPO

## 2021-03-03 ENCOUNTER — Ambulatory Visit: Payer: Medicare PPO | Admitting: Hematology

## 2021-03-03 ENCOUNTER — Ambulatory Visit: Payer: Medicare PPO

## 2021-03-04 NOTE — Progress Notes (Signed)
HEMATOLOGY/ONCOLOGY CLINIC NOTE  Date of Service: 03/05/2021  Patient Care Team: Hoyt Koch, MD as PCP - General (Internal Medicine) Lafayette Dragon, MD (Inactive) as Consulting Physician (Gastroenterology) Megan Salon, MD as Consulting Physician (Gynecology) Melrose Nakayama, MD as Consulting Physician (Orthopedic Surgery) Deneise Lever, MD as Consulting Physician (Pulmonary Disease) [name], MD (Ophthalmology)  CHIEF COMPLAINTS/PURPOSE OF CONSULTATION:  Continue mx of myeloma  HISTORY OF PRESENTING ILLNESS:   Faith Orr is a wonderful 80 y.o. female who has been referred to [name] by Dr. Pricilla Holm for evaluation and management of Lytic bone lesions. She is accompanied today by her son in law, and her partner is present via phone. The pt reports that she is doing well overall.   The pt notes that 2-3 months ago while standing at the stove, and otherwise feeling normally, she felt "something snap that took her breath away" in her mid back as she stretched to get something. The pt notes that she saw a chiropractor twice due to her back stiffness, which did not help. The pt then described her new bone pains to her PCP on 12/05/18, and subsequent imaging, as noted below, revealed concerns for numerous bone lesions. She has begun 48m Fosamax. The pt notes that she had hip pain in 2018, and that an XR at that time did not reveal any lesions.  The pt notes that most of her pain is concentrated to her left shoulder presently, and with minimal movement of the arm. She endorses pain radiating into her left arm and notes that her hand is swollen in the mornings when she wakes up. She also endorses present back pain and right hip pain, worse when she walks. She has not yet seen orthopedics. The pt reports that she is needing to take 6069mAdvil every 4-6 hours as Tramadol alone has not been able to alleviate her pain. The pt denies any unexpected weight loss, fevers,  chills, or night sweats. The pt notes that her urine is a very dark color presently, but denies overt blood in the urine, underpants, nor tissue paper. The pt notes that in the last two weeks she has had some soreness in her head, but denies new headaches or changes in vision. The pt notes that she has been urinating more frequently overall, but has been trying to stay better hydrated as well.  The pt notes that she has been compliant with annual mammograms.   The pt notes that she had a cyst in her right breast which was removed in the past. She fractured her left wrist in 2008 after falling down stairs. The pt endorses history of fatty liver.   The pt denies ever smoking cigarettes and endorses significant second hand smoke exposure with a previous marriage.   Of note prior to the patient's visit today, pt has had a Bone Scan completed on 12/13/18 with results revealing Multifocal uptake throughout the skeleton, consistent with diffuse metastatic disease. Primary tumor is not specified. 2. Uptake in the proximal right femur, consistent with lytic lesions. 3. Uptake in the ribs bilaterally as described. 4. Lesions in the proximal left humerus. 5. Diffuse uptake throughout the skull consistent with metastatic disease. 6. Right paramedian uptake at the manubrium.  Most recent lab results (12/08/18) of CBC w/diff and CMP is as follows: all values are WNL except for Glucose at 279, BUN at 24, AST at 41, ALT at 46. 12/08/18 SPEP revealed all values WNL except for Total  Protein at 6.0, Albumin at 3.6, Gamma globulin at 0.7, and M spike at 0.5g  On review of systems, pt reports significant left shoulder pain, back pain, right hip pain, dark urine, and denies fevers, chills, night sweats, unexpected weight loss, changes in bowel habits, changes in breathing, cough, new respiratory symptoms, changes in vision, abdominal pains, leg swelling, and any other symptoms.   On PMHx the pt reports fatty liver, and  denies blood clots.  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 Denies ever smoking.  On Family Hx the pt reports maternal grandmother with colon cancer. Father with bladder cancer and amyloidosis (pt notes that this could have been misdiagnosis). Mother with Protein S deficiency and polymyalgia rheumatica.  Current Treatment: Carfilzomib + Revlimid maintenance.  Interval History:  Faith Orr returns today for management and evaluation of her Multiple Myeloma. The patient's last visit with [name] was on 01/20/2021. The pt reports that she is doing well overall.  The pt reports no new symptoms or concerns. The pt is tolerating the continued maintenance treatment well. The pt notes she does not have much of an appetite, but she does eat daily because she knows she needs to. The pt uses Miralax in morning and Senna in evening.   Lab results today 03/05/2021 of CBC w/diff and CMP is as follows: all values are WNL except for WBC of 3.5K, RBC of 3.41, Hgb of 11.6, HCT of 34.9, MCV of 102.3, Plt of 136K, CO2 of 21, Glucose of 191, Creatinine of 1.06, Total protein of 6.0, Albumin of 3.2, AST of 10, GFR est of 54.  On review of systems, pt reports continued back pain and denies diarrhea, changes in bowel habits, new uncontrolled pains, new breast symptoms, leg swelling, abdominal pain, and any other symptoms.  MEDICAL HISTORY:  Past Medical History:  Diagnosis Date  . Allergy    seasonal  . Asthma   . DEPRESSION   . DIABETES MELLITUS, TYPE II   . Diverticulosis   . HYPERLIPIDEMIA   . Macular degeneration of left eye    mild, Dr.Hecker  . Obesity, unspecified   . Osteoarthritis of both knees   . OSTEOPENIA   . Osteopenia   . URINARY INCONTINENCE     SURGICAL HISTORY: Past Surgical History:  Procedure Laterality Date  . CATARACT EXTRACTION Left 05/24/2018  . CESAREAN SECTION  01/1973  . CYSTOSCOPY/URETEROSCOPY/HOLMIUM LASER/STENT PLACEMENT  Right 09/20/2019   Procedure: CYSTOSCOPY/URETEROSCOPY/HOLMIUM LASER/STENT PLACEMENT;  Surgeon: Lucas Mallow, MD;  Location: [facility];  Service: Urology;  Laterality: Right;  . FRACTURE SURGERY    . IR IMAGING GUIDED PORT INSERTION  02/20/2019  . left wrist surgery  2008   By Dr. Latanya Maudlin  . right ankle  1994    SOCIAL HISTORY: Social History   Socioeconomic History  . Marital status: Married    Spouse name: Not on file  . Number of children: 1  . Years of education: Not on file  . Highest education level: Not on file  Occupational History    Employer: [employer]  Tobacco Use  . Smoking status: Never Smoker  . Smokeless tobacco: Never Used  . Tobacco comment: Lives with partner [name]) and son  Vaping Use  . Vaping Use: Never used  Substance and Sexual Activity  . Alcohol use: No    Alcohol/week: 0.0 standard drinks  . Drug use: No  . Sexual activity:  Never    Partners: Female    Birth control/protection: Post-menopausal    Comment: Lives with female partner (annette hicks) and 20 yo son  Other Topics Concern  . Not on file  Social History Narrative  . Not on file   Social Determinants of Health   Financial Resource Strain: Not on file  Food Insecurity: Not on file  Transportation Needs: Not on file  Physical Activity: Not on file  Stress: Not on file  Social Connections: Not on file  Intimate Partner Violence: Not on file    FAMILY HISTORY: Family History  Problem Relation Age of Onset  . Diabetes Father   . Hyperlipidemia Father   . Heart disease Father   . Cancer Father   . Hypertension Father   . Colon cancer Paternal Grandmother 16  . Osteoporosis Mother   . Protein S deficiency Mother   . Hyperlipidemia Mother   . Multiple sclerosis Daughter   . Cancer Other        bladder  . Breast cancer Neg Hx     ALLERGIES:  is allergic to penicillins, aleve [naproxen sodium], and sulfonamide  derivatives.  MEDICATIONS:  Current Outpatient Medications  Medication Sig Dispense Refill  . acyclovir (ZOVIRAX) 400 MG tablet TAKE 1 TABLET(400 MG) BY MOUTH TWICE DAILY 60 tablet 5  . Blood Glucose Monitoring Suppl (FREESTYLE FREEDOM LITE) W/DEVICE KIT Use to check blood sugars twice a day Dx 250.00 1 each 0  . Calcium Carbonate-Vitamin D (CALCIUM 600+D HIGH POTENCY) 600-400 MG-UNIT per tablet Take 1 tablet by mouth 2 (two) times daily.     . Cetirizine HCl 10 MG CAPS Take 1 capsule (10 mg total) by mouth daily. 30 capsule 1  . dexamethasone (DECADRON) 4 MG tablet Take 5 tablets (20 mg total) by mouth once a week. On D22 of each cycle of treatment 20 tablet 5  . doxycycline (VIBRA-TABS) 100 MG tablet Take 1 tablet (100 mg total) by mouth 2 (two) times daily. (Patient not taking: Reported on 09/11/2020) 14 tablet 1  . ELIQUIS 2.5 MG TABS tablet TAKE 1 TABLET(2.5 MG) BY MOUTH TWICE DAILY 60 tablet 2  . fentaNYL (DURAGESIC) 12 MCG/HR Place 1 patch onto the skin every 3 (three) days. 10 patch 0  . fluticasone (FLONASE) 50 MCG/ACT nasal spray Place 1 spray into both nostrils daily. 16 g 2  . glipiZIDE (GLUCOTROL XL) 5 MG 24 hr tablet TAKE 1 TABLET(5 MG) BY MOUTH DAILY WITH BREAKFAST 90 tablet 1  . glucose blood (FREESTYLE LITE) test strip CHECK BLOOD SUGAR TWICE DAILY AS DIRECTED Dx 250.00 180 each 3  . Lancets (FREESTYLE) lancets Use twice daily to check sugars. 100 each 11  . lenalidomide (REVLIMID) 15 MG capsule TAKE 1 CAPSULE BY MOUTH  DAILY FOR 21 DAYS ON, THEN  7 DAYS OFF 21 capsule 0  . lidocaine-prilocaine (EMLA) cream APPLY 1 APPLICATION TO THE AFFECTED AREA AS NEEDED. USE PRIOR TO PORT ACCESS 30 g 0  . LORazepam (ATIVAN) 0.5 MG tablet Take 1 tablet (0.5 mg total) by mouth every 8 (eight) hours as needed for anxiety (significant essential tremors). 60 tablet 0  . metFORMIN (GLUCOPHAGE-XR) 500 MG 24 hr tablet TAKE 3 TABLETS(1500 MG) BY MOUTH DAILY WITH BREAKFAST 270 tablet 1  .  methylPREDNISolone (MEDROL DOSEPAK) 4 MG TBPK tablet As directed (Patient not taking: Reported on 09/11/2020) 21 tablet 0  . Multiple Vitamins-Minerals (ICAPS) CAPS Take 1 capsule by mouth daily after breakfast.     .  ondansetron (ZOFRAN) 8 MG tablet Take 1 tablet (8 mg total) by mouth 2 (two) times daily as needed (Nausea or vomiting). 30 tablet 1  . Oxycodone HCl 10 MG TABS Take 1 tablet (10 mg total) by mouth every 6 (six) hours as needed. 90 tablet 0  . pantoprazole (PROTONIX) 20 MG tablet TAKE 1 TABLET(20 MG) BY MOUTH DAILY 30 tablet 5  . polyethylene glycol (MIRALAX / GLYCOLAX) packet Take 17 g by mouth daily after breakfast.  (Patient not taking: Reported on 09/11/2020)    . potassium chloride SA (KLOR-CON) 20 MEQ tablet TAKE 1 TABLET(20 MEQ) BY MOUTH TWICE DAILY 180 tablet 0  . prochlorperazine (COMPAZINE) 10 MG tablet Take 1 tablet (10 mg total) by mouth every 6 (six) hours as needed (Nausea or vomiting). 30 tablet 1  . senna-docusate (SENNA S) 8.6-50 MG tablet Take 2 tablets by mouth at bedtime. 60 tablet 2  . sertraline (ZOLOFT) 50 MG tablet Take 1 tablet (50 mg total) by mouth daily. 90 tablet 3  . simvastatin (ZOCOR) 20 MG tablet TAKE 1 TABLET BY MOUTH EVERY DAY AT 6 PM 30 tablet 2  . Triamcinolone Acetonide 0.025 % LOTN Apply 1 application topically 3 (three) times daily as needed (rash/itching). 60 mL 2  . triamcinolone cream (KENALOG) 0.5 % Apply 1 application topically 4 (four) times daily. 90 g 1  . Vitamin D, Ergocalciferol, (DRISDOL) 1.25 MG (50000 UNIT) CAPS capsule TAKE 1 CAPSULE BY MOUTH EVERY 7 DAYS 12 capsule 0   No current facility-administered medications for this visit.   Facility-Administered Medications Ordered in Other Visits  Medication Dose Route Frequency Provider Last Rate Last Admin  . heparin lock flush 100 unit/mL  500 Units Intracatheter Once PRN Brunetta Genera, MD      . sodium chloride flush (NS) 0.9 % injection 10 mL  10 mL Intracatheter PRN Brunetta Genera, MD   10 mL at 10/02/19 1524    REVIEW OF SYSTEMS:   10 Point review of Systems was done is negative except as noted above.  PHYSICAL EXAMINATION: ECOG FS:2 - Symptomatic, <50% confined to bed  Vitals:   03/05/21 0930  BP: (!) 117/54  Pulse: 69  Resp: 18  Temp: (!) 97.4 F (36.3 C)  SpO2: 99%   Wt Readings from Last 3 Encounters:  03/05/21 155 lb 3.2 oz (70.4 kg)  01/20/21 158 lb 1.6 oz (71.7 kg)  01/06/21 156 lb 8 oz (71 kg)   Body mass index is 31.35 kg/m.    Exam was given in a chair.  GENERAL:alert, in no acute distress and comfortable SKIN: no acute rashes, no significant lesions EYES: conjunctiva are pink and non-injected, sclera anicteric OROPHARYNX: MMM, no exudates, no oropharyngeal erythema or ulceration NECK: supple, no JVD LYMPH:  no palpable lymphadenopathy in the cervical, axillary or inguinal regions LUNGS: clear to auscultation b/l with normal respiratory effort HEART: regular rate & rhythm ABDOMEN:  normoactive bowel sounds , non tender, not distended. Extremity: no pedal edema PSYCH: alert & oriented x 3 with fluent speech NEURO: no focal motor/sensory deficits   LABORATORY DATA:  I have reviewed the data as listed  . CBC Latest Ref Rng & Units 03/05/2021 02/24/2021 02/03/2021  WBC 4.0 - 10.5 K/uL 3.5(L) 3.3(L) 3.7(L)  Hemoglobin 12.0 - 15.0 g/dL 11.6(L) 12.1 12.1  Hematocrit 36.0 - 46.0 % 34.9(L) 36.0 34.7(L)  Platelets 150 - 400 K/uL 136(L) 91(L) 132(L)   . CBC    Component Value Date/Time  WBC 3.5 (L) 03/05/2021 0918   RBC 3.41 (L) 03/05/2021 0918   HGB 11.6 (L) 03/05/2021 0918   HGB 12.8 02/16/2019 1138   HCT 34.9 (L) 03/05/2021 0918   PLT 136 (L) 03/05/2021 0918   PLT 170 02/16/2019 1138   MCV 102.3 (H) 03/05/2021 0918   MCH 34.0 03/05/2021 0918   MCHC 33.2 03/05/2021 0918   RDW 13.8 03/05/2021 0918   LYMPHSABS 0.9 03/05/2021 0918   MONOABS 0.9 03/05/2021 0918   EOSABS 0.0 03/05/2021 0918   BASOSABS 0.0  03/05/2021 0918    . CMP Latest Ref Rng & Units 03/05/2021 02/24/2021 02/03/2021  Glucose 70 - 99 mg/dL 191(H) 198(H) 196(H)  BUN 8 - 23 mg/dL '15 13 10  ' Creatinine 0.44 - 1.00 mg/dL 1.06(H) 0.98 1.03(H)  Sodium 135 - 145 mmol/L 141 136 139  Potassium 3.5 - 5.1 mmol/L 3.9 3.7 2.9(L)  Chloride 98 - 111 mmol/L 109 107 110  CO2 22 - 32 mmol/L 21(L) 21(L) 19(L)  Calcium 8.9 - 10.3 mg/dL 9.6 9.0 9.0  Total Protein 6.5 - 8.1 g/dL 6.0(L) 5.9(L) 6.2(L)  Total Bilirubin 0.3 - 1.2 mg/dL 0.5 0.7 0.5  Alkaline Phos 38 - 126 U/L 92 90 90  AST 15 - 41 U/L 10(L) 12(L) 9(L)  ALT 0 - 44 U/L '16 15 13   ' 09/18/2019 BM Bx Report ([id number])   09/18/2019 FISH Panel    05/30/2019 BM Bx   01/06/2019 BM Bx:     01/06/19 Cytogenetics:      05/30/19 BM Biopsy:   09/18/2019 FISH Panel    09/18/2019 BM Surgical Pathology ([id number])     RADIOGRAPHIC STUDIES: I have personally reviewed the radiological images as listed and agreed with the findings in the report. No results found.  ASSESSMENT & PLAN:   80 y.o. female with  1. Recently diagnosed Multiple Myeloma, RISS Stage III  Labs upon initial presentation from 12/08/18, blood counts are normal including WBC at 7.1k, HGB at 13.1, and PLT at 245k. Calcium normal at 10.3. Creatinine normal at 0.63. M spike at 0.5g. 12/13/18 Bone Scan revealed Multifocal uptake throughout the skeleton, consistent with diffuse metastatic disease. Primary tumor is not specified. 2. Uptake in the proximal right femur, consistent with lytic lesions. 3. Uptake in the ribs bilaterally as described. 4. Lesions in the proximal left humerus. 5. Diffuse uptake throughout the skull consistent with metastatic disease. 6. Right paramedian uptake at the manubrium.  12/13/18 CT Right Femur revealed Numerous lytic lesions involving the right femur and a lytic lesion in the left inferior pubic ramus. Overall appearance is most concerning for multiple  myeloma  12/27/18 Pretreatment 24hour UPEP observed an M spike at 65m, and showed 1937mtotal protein/day.  12/27/18 Pretreatment MMP revealed M Protein at 0.5g with IgG Lambda specificity. Kappa:Lambda light chain ratio at 0.13, with Lambda at 40.3. There is less abnormal protein and light chains than I would expect from 30% plasma cells, which suggests hypo-secretory or non-secretory neoplastic plasma cells. Will have an impact in assessing response. 01/05/19 PET/CT revealed Innumerable lytic lesions in the skeleton compatible with myeloma. Most of the larger lesions are hypermetabolic, for example including a left proximal humeral shaft lesion with maximum SUV of 8.1 and a 2.8 cm lesion in the left T9 vertebral body with maximum SUV 5.1. Most of the smaller lytic lesions, and some of the larger lesions, do not demonstrate accentuated metabolic activity. 2. 1.2 cm in short axis lymph node in the left parapharyngeal space  is hypermetabolic with maximum SUV 11.8. I do not see a separate mass in the head and neck to give rise to this hypermetabolic lymph node. 3. Mosaic attenuation in the lower lobes, nonspecific possibly from air trapping. 4.  Aortic Atherosclerosis 5. Heterogeneous activity in the liver, making it hard to exclude small liver lesions. Consider hepatic protocol MRI with and without contrast for definitive assessment. Nonobstructive right nephrolithiasis. Old granulomatous disease  01/06/19 Bone Marrow biopsy revealed interstitial increase in plasma cells (28% aspirate, 40% CD138 immunohistochemistry). Plasma cells negative for light chains consistent with a non or weakly secretory myeloma   01/06/19 Cytogenetics revealed 37% of cells with trisomy 11 or 11q deletion, and 40.5% of cells with 17p mutation  S/p 5 cycles of KRD treatment  05/31/19 BM Biopsy revealed mild atypical plasmacytosis at 5% with polytypic variation.   06/01/19 PET/CT revealed "Dominant lesion in the LEFT humerus is  decreased significantly in metabolic activity. Additional hypermetabolic skeletal lytic lesions have decreased in metabolic activity or similar to comparison exam (01/05/2019). No evidence of disease progression. 2. Multiple additional lytic lesions do not have metabolic activity and unchanged. 3. No new skeletal lesions are identified. No soft tissue plasmacytoma identified. 4. Nodule / node in the LEFT parapharyngeal space which is intensely hypermetabolic not changed from prior. 5. New hypermetabolic LEFT lower lobe pulmonary nodule is indeterminate. Recommend close attention on follow-up 6. New obstructive hydronephrosis of the RIGHT kidney related to RIGHT UPJ stone."  09/18/2019 BM Bx Report which revealed "Slightly hypercellular bone marrow for age with trilineage hematopoiesis and 1% plasma cells."  09/14/2019 PET/CT Whole Body Scan ([id number]) which revealed "1. There widespread tiny lytic lesions compatible with multiple myeloma. Index larger lesions are generally similar to the prior exam, with low-grade activity such as the left T9 vertebral body lesion with maximum SUV 4.5. Is mild increase in the activity associated with a mildly sclerotic left proximal humeral lesion, maximum SUV 4.8 (previously 3.5). 2. At the site of the prior left lower lobe nodule is currently more bandlike thickening, with maximum SUV only 1.9, probably benign, continued surveillance of this region suggested. 3. There several small but hypermetabolic lymph nodes. This includes a left parapharyngeal space node measuring 1.0 cm with maximum SUV 12.3 (stable); a left level IB lymph node measuring 0.5 cm with maximum SUV 4.8 (slightly larger than prior); and a left inguinal lymph node measuring 0.7 cm in short axis with maximum SUV 6.4 (previously 0.5 cm with maximum SUV 0.6). Significance of these lymph nodes uncertain, surveillance is recommended. 4. New 5 mm left lower lobe subpleural nodule on image 32/8, not appreciably  hypermetabolic, surveillance suggested. 5. Focal subcutaneous stranding along the left perineum measuring about 2.6 by 1.1 cm on image 221/4, maximum SUV 12.5. This was not present previously and is most likely inflammatory, although given the notable SUV, surveillance of this region is suggested. 6. Other imaging findings of potential clinical significance: Aortic Atherosclerosis (ICD10-I70.0). Coronary atherosclerosis. Old granulomatous disease. Mild right hydronephrosis due to a 7 mm right UPJ calculus. 2 mm right kidney upper pole nonobstructive renal calculus. Prominent stool throughout the colon favors constipation."  12/19/2019 Thoracic & Lumbar Spine MRI ([id number]) ([id number]) revealed "Suspected myeloma lesions at T9 and S1. No compression deformity or epidural disease.  01/18/2020 PET/CT ([id number]) which revealed "1. Stable lytic lesions throughout the skeleton. The larger lytic lesions which had mild metabolic activity on comparison exam now have background metabolic activity. No evidence of active myeloma. No evidence of  progression multiple myeloma.  No plasmacytoma 3. Hypermetabolic nodules in the LEFT neck may be associated deep tissues of the LEFT parotid gland. 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  01/17/19 MRI Liver revealed Several appreciable liver lesions all have benign imaging characteristics. No MRI findings of metastatic involvement of the liver. 2. Scattered bony lesions corresponding to the lytic lesions seen at PET-CT, compatible with active myeloma. 3. Aortic Atherosclerosis.  Mild cardiomegaly. 4. Diffuse hepatic steatosis.   3. Left lower lobe pulmonary nodule First seen on 06/01/19 PET/CT PET/CT 12/16/7251: No hypermetabolic mediastinal or hilar nodes. No  suspicious pulmonary nodules on the CT scan.  4. 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 Being managed conservatively as per patient's preference.  No symptoms from this currently.  PLAN: -Discussed pt labwork today, 03/05/2021; blood counts steady, Plt slightly low from treatment, Blood chemistries stable. -Advised pt her last Bm Bx was in October 2020. Will need to repeat this. Will want to repeat PET scan as well in the next month. -Discussed potential of transplant. The pt still declines this option. -Recommended pt receive her yearly mammograms, but it is personal choice for preventive purposes. -Advised pt she could go to dentist for routine cleaning. Will have to use antibiotics prior to be extra cautious and avoid Port infections. -Advised pt that if there are no active lesions on Bm Bx, we can move to a maintenance dosage of Zometa (q28month). -Discussed [place] and pt's eligibility. The pt wishes to wait at this time due to BM Bx, but would like a referral. -The pt has no prohibitive toxicities from continuing C26D1 Carfilzomib & Revlimid at this time. -Will continue Carflizomib at 56 mg/m^2 q2weeks for maintenance. -Continue 15 mg Revlimid 3 weeks on, 1 week off for maintenance. No prohibitive toxicities at this time. -Continue 2.5 Eliquis BID for VTE prophylaxis -Continue Potassium daily.  -Continue Zometa q4weeks -Will get Bm Bx and PET in 7 weeks.  -Will see back in 8 weeks with labs.   FOLLOW UP: PET/CT in 7 weeks CT bone marrow aspiration and biopsy in 7 weeks Plz schedule next 3 cycles (6 doses of Kyprolis) as per orders Portflush and labs with each treatment MD visit in 8 weeks   All of the patient's questions were answered with apparent satisfaction. The patient knows to call the clinic with any problems, questions or concerns.  The total time spent in the appointment was  30 minutes and more than 50% was on counseling and direct patient cares.    GSullivan LoneMD MSpragueAAHIVMS SSt. Joseph'S Children'S HospitalCSan [facility] LPHematology/Oncology Physician CMayaguez Medical Center (Office):       [phone](Work cell):  [phone](Fax):           [phone] 03/05/2021 10:37 AM  I, RReinaldo Raddle am acting as scribe for Dr. GSullivan Lone MD.   .I have reviewed the above documentation for accuracy and completeness, and I agree with the above. .Brunetta GeneraMD

## 2021-03-05 ENCOUNTER — Inpatient Hospital Stay: Payer: Medicare PPO

## 2021-03-05 ENCOUNTER — Telehealth: Payer: Self-pay | Admitting: Hematology

## 2021-03-05 ENCOUNTER — Inpatient Hospital Stay: Payer: Medicare PPO | Admitting: Hematology

## 2021-03-05 ENCOUNTER — Other Ambulatory Visit: Payer: Self-pay

## 2021-03-05 VITALS — BP 117/54 | HR 69 | Temp 97.4°F | Resp 18 | Ht 59.0 in | Wt 155.2 lb

## 2021-03-05 DIAGNOSIS — Z79899 Other long term (current) drug therapy: Secondary | ICD-10-CM | POA: Diagnosis not present

## 2021-03-05 DIAGNOSIS — Z7189 Other specified counseling: Secondary | ICD-10-CM

## 2021-03-05 DIAGNOSIS — C9000 Multiple myeloma not having achieved remission: Secondary | ICD-10-CM

## 2021-03-05 DIAGNOSIS — Z95828 Presence of other vascular implants and grafts: Secondary | ICD-10-CM

## 2021-03-05 DIAGNOSIS — Z5112 Encounter for antineoplastic immunotherapy: Secondary | ICD-10-CM | POA: Diagnosis not present

## 2021-03-05 DIAGNOSIS — C7951 Secondary malignant neoplasm of bone: Secondary | ICD-10-CM

## 2021-03-05 DIAGNOSIS — Z5111 Encounter for antineoplastic chemotherapy: Secondary | ICD-10-CM | POA: Diagnosis not present

## 2021-03-05 LAB — CMP (CANCER CENTER ONLY)
ALT: 16 U/L (ref 0–44)
AST: 10 U/L — ABNORMAL LOW (ref 15–41)
Albumin: 3.2 g/dL — ABNORMAL LOW (ref 3.5–5.0)
Alkaline Phosphatase: 92 U/L (ref 38–126)
Anion gap: 11 (ref 5–15)
BUN: 15 mg/dL (ref 8–23)
CO2: 21 mmol/L — ABNORMAL LOW (ref 22–32)
Calcium: 9.6 mg/dL (ref 8.9–10.3)
Chloride: 109 mmol/L (ref 98–111)
Creatinine: 1.06 mg/dL — ABNORMAL HIGH (ref 0.44–1.00)
GFR, Estimated: 54 mL/min — ABNORMAL LOW (ref >60)
Glucose, Bld: 191 mg/dL — ABNORMAL HIGH (ref 70–99)
Potassium: 3.9 mmol/L (ref 3.5–5.1)
Sodium: 141 mmol/L (ref 135–145)
Total Bilirubin: 0.5 mg/dL (ref 0.3–1.2)
Total Protein: 6 g/dL — ABNORMAL LOW (ref 6.5–8.1)

## 2021-03-05 LAB — CBC WITH DIFFERENTIAL/PLATELET
Abs Immature Granulocytes: 0.02 10*3/uL (ref 0.00–0.07)
Basophils Absolute: 0 10*3/uL (ref 0.0–0.1)
Basophils Relative: 1 %
Eosinophils Absolute: 0 10*3/uL (ref 0.0–0.5)
Eosinophils Relative: 1 %
HCT: 34.9 % — ABNORMAL LOW (ref 36.0–46.0)
Hemoglobin: 11.6 g/dL — ABNORMAL LOW (ref 12.0–15.0)
Immature Granulocytes: 1 %
Lymphocytes Relative: 25 %
Lymphs Abs: 0.9 10*3/uL (ref 0.7–4.0)
MCH: 34 pg (ref 26.0–34.0)
MCHC: 33.2 g/dL (ref 30.0–36.0)
MCV: 102.3 fL — ABNORMAL HIGH (ref 80.0–100.0)
Monocytes Absolute: 0.9 10*3/uL (ref 0.1–1.0)
Monocytes Relative: 24 %
Neutro Abs: 1.7 10*3/uL (ref 1.7–7.7)
Neutrophils Relative %: 48 %
Platelets: 136 10*3/uL — ABNORMAL LOW (ref 150–400)
RBC: 3.41 MIL/uL — ABNORMAL LOW (ref 3.87–5.11)
RDW: 13.8 % (ref 11.5–15.5)
WBC: 3.5 10*3/uL — ABNORMAL LOW (ref 4.0–10.5)
nRBC: 0 % (ref 0.0–0.2)

## 2021-03-05 MED ORDER — SODIUM CHLORIDE 0.9% FLUSH
10.0000 mL | INTRAVENOUS | Status: DC | PRN
  Administered 2021-03-05: 10 mL
  Filled 2021-03-05: qty 10

## 2021-03-05 MED ORDER — SODIUM CHLORIDE 0.9 % IV SOLN
Freq: Once | INTRAVENOUS | Status: AC
  Filled 2021-03-05: qty 250

## 2021-03-05 MED ORDER — SODIUM CHLORIDE 0.9% FLUSH
10.0000 mL | Freq: Once | INTRAVENOUS | Status: AC
  Administered 2021-03-05: 10 mL
  Filled 2021-03-05: qty 10

## 2021-03-05 MED ORDER — DEXAMETHASONE 4 MG PO TABS
12.0000 mg | ORAL_TABLET | Freq: Once | ORAL | Status: AC
  Administered 2021-03-05: 12 mg via ORAL

## 2021-03-05 MED ORDER — HEPARIN SOD (PORK) LOCK FLUSH 100 UNIT/ML IV SOLN
500.0000 [IU] | Freq: Once | INTRAVENOUS | Status: AC | PRN
  Administered 2021-03-05: 500 [IU]
  Filled 2021-03-05: qty 5

## 2021-03-05 MED ORDER — ACETAMINOPHEN 325 MG PO TABS
650.0000 mg | ORAL_TABLET | Freq: Once | ORAL | Status: AC
  Administered 2021-03-05: 650 mg via ORAL

## 2021-03-05 MED ORDER — PROCHLORPERAZINE MALEATE 10 MG PO TABS
10.0000 mg | ORAL_TABLET | Freq: Once | ORAL | Status: AC
  Administered 2021-03-05: 10 mg via ORAL

## 2021-03-05 MED ORDER — CLINDAMYCIN HCL 300 MG PO CAPS: 600.0000 mg | ORAL_CAPSULE | Freq: Once | ORAL | 0 refills | Status: DC | PRN

## 2021-03-05 MED ORDER — FAMOTIDINE 20 MG PO TABS
20.0000 mg | ORAL_TABLET | Freq: Once | ORAL | Status: AC
  Administered 2021-03-05: 20 mg via ORAL

## 2021-03-05 MED ORDER — DIPHENHYDRAMINE HCL 25 MG PO TABS
25.0000 mg | ORAL_TABLET | Freq: Once | ORAL | Status: AC
  Administered 2021-03-05: 25 mg via ORAL
  Filled 2021-03-05: qty 1

## 2021-03-05 MED ORDER — CARFILZOMIB CHEMO INJECTION 60 MG
56.0000 mg/m2 | Freq: Once | INTRAVENOUS | Status: AC
  Administered 2021-03-05: 100 mg via INTRAVENOUS
  Filled 2021-03-05: qty 15

## 2021-03-05 MED ORDER — FAMOTIDINE 20 MG PO TABS
ORAL_TABLET | ORAL | Status: AC
  Filled 2021-03-05: qty 1

## 2021-03-05 MED ORDER — PROCHLORPERAZINE MALEATE 10 MG PO TABS
ORAL_TABLET | ORAL | Status: AC
  Filled 2021-03-05: qty 1

## 2021-03-05 MED ORDER — ACETAMINOPHEN 325 MG PO TABS
ORAL_TABLET | ORAL | Status: AC
  Filled 2021-03-05: qty 2

## 2021-03-05 MED ORDER — DEXAMETHASONE 4 MG PO TABS
ORAL_TABLET | ORAL | Status: AC
  Filled 2021-03-05: qty 3

## 2021-03-05 MED ORDER — DIPHENHYDRAMINE HCL 25 MG PO CAPS
ORAL_CAPSULE | ORAL | Status: AC
  Filled 2021-03-05: qty 1

## 2021-03-05 NOTE — Telephone Encounter (Signed)
Scheduled follow-up appointments per 3/23 los. Patient is aware.

## 2021-03-05 NOTE — Patient Instructions (Signed)
[facility] Discharge Instructions for Patients Receiving Chemotherapy  Today you received the following chemotherapy agents:  Kyprolis  To help prevent nausea and vomiting after your treatment, we encourage you to take your nausea medication as directed.   If you develop nausea and vomiting that is not controlled by your nausea medication, call the clinic.   BELOW ARE SYMPTOMS THAT SHOULD BE REPORTED IMMEDIATELY:  *FEVER GREATER THAN 100.5 F  *CHILLS WITH OR WITHOUT FEVER  NAUSEA AND VOMITING THAT IS NOT CONTROLLED WITH YOUR NAUSEA MEDICATION  *UNUSUAL SHORTNESS OF BREATH  *UNUSUAL BRUISING OR BLEEDING  TENDERNESS IN MOUTH AND THROAT WITH OR WITHOUT PRESENCE OF ULCERS  *URINARY PROBLEMS  *BOWEL PROBLEMS  UNUSUAL RASH Items with * indicate a potential emergency and should be followed up as soon as possible.  Feel free to call the clinic should you have any questions or concerns. The clinic phone number is [phone].  Please show the CHEMO ALERT CARD at check-in to the Emergency Department and triage nurse.   

## 2021-03-10 ENCOUNTER — Other Ambulatory Visit: Payer: Self-pay | Admitting: Internal Medicine

## 2021-03-12 ENCOUNTER — Telehealth (HOSPITAL_COMMUNITY): Payer: Self-pay

## 2021-03-14 ENCOUNTER — Telehealth (HOSPITAL_COMMUNITY): Payer: Self-pay

## 2021-03-16 ENCOUNTER — Encounter: Payer: Self-pay | Admitting: Hematology

## 2021-03-17 ENCOUNTER — Other Ambulatory Visit: Payer: Self-pay | Admitting: *Deleted

## 2021-03-17 DIAGNOSIS — C9000 Multiple myeloma not having achieved remission: Secondary | ICD-10-CM

## 2021-03-17 MED ORDER — FENTANYL 12 MCG/HR TD PT72: 1.0000 | MEDICATED_PATCH | TRANSDERMAL | 0 refills | Status: DC

## 2021-03-17 NOTE — Telephone Encounter (Signed)
Received request via Mychart for Fentanyl patch Request routed to Dr. Irene Limbo

## 2021-03-24 ENCOUNTER — Other Ambulatory Visit: Payer: Self-pay | Admitting: Hematology

## 2021-03-24 DIAGNOSIS — C9000 Multiple myeloma not having achieved remission: Secondary | ICD-10-CM

## 2021-03-25 ENCOUNTER — Inpatient Hospital Stay: Payer: Medicare PPO

## 2021-03-25 ENCOUNTER — Inpatient Hospital Stay: Payer: Medicare PPO | Attending: Hematology

## 2021-03-25 ENCOUNTER — Other Ambulatory Visit: Payer: Self-pay

## 2021-03-25 VITALS — BP 125/57 | HR 71 | Temp 98.1°F | Resp 18 | Wt 154.5 lb

## 2021-03-25 DIAGNOSIS — C9000 Multiple myeloma not having achieved remission: Secondary | ICD-10-CM | POA: Insufficient documentation

## 2021-03-25 DIAGNOSIS — Z95828 Presence of other vascular implants and grafts: Secondary | ICD-10-CM

## 2021-03-25 DIAGNOSIS — Z79899 Other long term (current) drug therapy: Secondary | ICD-10-CM | POA: Insufficient documentation

## 2021-03-25 DIAGNOSIS — Z7189 Other specified counseling: Secondary | ICD-10-CM

## 2021-03-25 DIAGNOSIS — Z5112 Encounter for antineoplastic immunotherapy: Secondary | ICD-10-CM | POA: Insufficient documentation

## 2021-03-25 DIAGNOSIS — Z5111 Encounter for antineoplastic chemotherapy: Secondary | ICD-10-CM

## 2021-03-25 LAB — CBC WITH DIFFERENTIAL/PLATELET
Abs Immature Granulocytes: 0.01 10*3/uL (ref 0.00–0.07)
Basophils Absolute: 0 10*3/uL (ref 0.0–0.1)
Basophils Relative: 1 %
Eosinophils Absolute: 0.3 10*3/uL (ref 0.0–0.5)
Eosinophils Relative: 9 %
HCT: 35.5 % — ABNORMAL LOW (ref 36.0–46.0)
Hemoglobin: 12.1 g/dL (ref 12.0–15.0)
Immature Granulocytes: 0 %
Lymphocytes Relative: 30 %
Lymphs Abs: 1 10*3/uL (ref 0.7–4.0)
MCH: 34.6 pg — ABNORMAL HIGH (ref 26.0–34.0)
MCHC: 34.1 g/dL (ref 30.0–36.0)
MCV: 101.4 fL — ABNORMAL HIGH (ref 80.0–100.0)
Monocytes Absolute: 0.8 10*3/uL (ref 0.1–1.0)
Monocytes Relative: 26 %
Neutro Abs: 1.1 10*3/uL — ABNORMAL LOW (ref 1.7–7.7)
Neutrophils Relative %: 34 %
Platelets: 88 10*3/uL — ABNORMAL LOW (ref 150–400)
RBC: 3.5 MIL/uL — ABNORMAL LOW (ref 3.87–5.11)
RDW: 13.6 % (ref 11.5–15.5)
WBC: 3.3 10*3/uL — ABNORMAL LOW (ref 4.0–10.5)
nRBC: 0 % (ref 0.0–0.2)

## 2021-03-25 LAB — CMP (CANCER CENTER ONLY)
ALT: 11 U/L (ref 0–44)
AST: 14 U/L — ABNORMAL LOW (ref 15–41)
Albumin: 3.4 g/dL — ABNORMAL LOW (ref 3.5–5.0)
Alkaline Phosphatase: 81 U/L (ref 38–126)
Anion gap: 13 (ref 5–15)
BUN: 11 mg/dL (ref 8–23)
CO2: 21 mmol/L — ABNORMAL LOW (ref 22–32)
Calcium: 9.5 mg/dL (ref 8.9–10.3)
Chloride: 107 mmol/L (ref 98–111)
Creatinine: 0.93 mg/dL (ref 0.44–1.00)
GFR, Estimated: >60 mL/min (ref >60)
Glucose, Bld: 173 mg/dL — ABNORMAL HIGH (ref 70–99)
Potassium: 3.5 mmol/L (ref 3.5–5.1)
Sodium: 141 mmol/L (ref 135–145)
Total Bilirubin: 0.8 mg/dL (ref 0.3–1.2)
Total Protein: 6 g/dL — ABNORMAL LOW (ref 6.5–8.1)

## 2021-03-25 MED ORDER — SODIUM CHLORIDE 0.9 % IV SOLN
Freq: Once | INTRAVENOUS | Status: AC
Start: 2021-03-25 — End: 2021-03-25
  Filled 2021-03-25: qty 250

## 2021-03-25 MED ORDER — DIPHENHYDRAMINE HCL 25 MG PO CAPS
ORAL_CAPSULE | ORAL | Status: AC
  Filled 2021-03-25: qty 1

## 2021-03-25 MED ORDER — FAMOTIDINE 20 MG PO TABS
20.0000 mg | ORAL_TABLET | Freq: Once | ORAL | Status: AC
  Administered 2021-03-25: 20 mg via ORAL

## 2021-03-25 MED ORDER — ZOLEDRONIC ACID 4 MG/100ML IV SOLN
INTRAVENOUS | Status: AC
  Filled 2021-03-25: qty 100

## 2021-03-25 MED ORDER — SODIUM CHLORIDE 0.9% FLUSH
10.0000 mL | INTRAVENOUS | Status: DC | PRN
  Administered 2021-03-25: 10 mL
  Filled 2021-03-25: qty 10

## 2021-03-25 MED ORDER — ACETAMINOPHEN 325 MG PO TABS
650.0000 mg | ORAL_TABLET | Freq: Once | ORAL | Status: AC
  Administered 2021-03-25: 650 mg via ORAL

## 2021-03-25 MED ORDER — ZOLEDRONIC ACID 4 MG/100ML IV SOLN
4.0000 mg | Freq: Once | INTRAVENOUS | Status: AC
  Administered 2021-03-25: 4 mg via INTRAVENOUS

## 2021-03-25 MED ORDER — DEXAMETHASONE 4 MG PO TABS
ORAL_TABLET | ORAL | Status: AC
  Filled 2021-03-25: qty 3

## 2021-03-25 MED ORDER — FAMOTIDINE 20 MG PO TABS
ORAL_TABLET | ORAL | Status: AC
  Filled 2021-03-25: qty 1

## 2021-03-25 MED ORDER — DEXAMETHASONE 4 MG PO TABS
12.0000 mg | ORAL_TABLET | Freq: Once | ORAL | Status: AC
  Administered 2021-03-25: 12 mg via ORAL

## 2021-03-25 MED ORDER — DIPHENHYDRAMINE HCL 25 MG PO TABS
25.0000 mg | ORAL_TABLET | Freq: Once | ORAL | Status: AC
  Administered 2021-03-25: 25 mg via ORAL
  Filled 2021-03-25: qty 1

## 2021-03-25 MED ORDER — PROCHLORPERAZINE MALEATE 10 MG PO TABS
10.0000 mg | ORAL_TABLET | Freq: Once | ORAL | Status: AC
  Administered 2021-03-25: 10 mg via ORAL

## 2021-03-25 MED ORDER — PROCHLORPERAZINE MALEATE 10 MG PO TABS
ORAL_TABLET | ORAL | Status: AC
  Filled 2021-03-25: qty 1

## 2021-03-25 MED ORDER — ACETAMINOPHEN 325 MG PO TABS
ORAL_TABLET | ORAL | Status: AC
  Filled 2021-03-25: qty 2

## 2021-03-25 MED ORDER — HEPARIN SOD (PORK) LOCK FLUSH 100 UNIT/ML IV SOLN
500.0000 [IU] | Freq: Once | INTRAVENOUS | Status: AC | PRN
  Administered 2021-03-25: 500 [IU]
  Filled 2021-03-25: qty 5

## 2021-03-25 MED ORDER — DEXTROSE 5 % IV SOLN
56.0000 mg/m2 | Freq: Once | INTRAVENOUS | Status: AC
  Administered 2021-03-25: 100 mg via INTRAVENOUS
  Filled 2021-03-25: qty 5

## 2021-03-25 NOTE — Progress Notes (Signed)
Per Dr. Irene Limbo, ok to treat with ANC 1.1 and Plts 88.

## 2021-03-25 NOTE — Patient Instructions (Addendum)
[facility] Discharge Instructions for Patients Receiving Chemotherapy  Today you received the following chemotherapy agents: Kyprolis   To help prevent nausea and vomiting after your treatment, we encourage you to take your nausea medication as directed.   If you develop nausea and vomiting that is not controlled by your nausea medication, call the clinic.   BELOW ARE SYMPTOMS THAT SHOULD BE REPORTED IMMEDIATELY:  *FEVER GREATER THAN 100.5 F  *CHILLS WITH OR WITHOUT FEVER  NAUSEA AND VOMITING THAT IS NOT CONTROLLED WITH YOUR NAUSEA MEDICATION  *UNUSUAL SHORTNESS OF BREATH  *UNUSUAL BRUISING OR BLEEDING  TENDERNESS IN MOUTH AND THROAT WITH OR WITHOUT PRESENCE OF ULCERS  *URINARY PROBLEMS  *BOWEL PROBLEMS  UNUSUAL RASH Items with * indicate a potential emergency and should be followed up as soon as possible.  Feel free to call the clinic should you have any questions or concerns. The clinic phone number is [phone].  Please show the [place] at check-in to the Emergency Department and triage nurse.  Zoledronic Acid Injection (Hypercalcemia, Oncology) What is this medicine? ZOLEDRONIC ACID (ZOE le dron ik AS id) slows calcium loss from bones. It high calcium levels in the blood from some kinds of cancer. It may be used in other people at risk for bone loss. This medicine may be used for other purposes; ask your health care provider or pharmacist if you have questions. COMMON BRAND NAME(S): Zometa What should I tell my health care provider before I take this medicine? They need to know if you have any of these conditions:  cancer  dehydration  dental disease  kidney disease  liver disease  low levels of calcium in the blood  lung or breathing disease (asthma)  receiving steroids like dexamethasone or prednisone  an unusual or allergic reaction to zoledronic acid, other medicines, foods, dyes, or preservatives  pregnant or trying to  get pregnant  breast-feeding How should I use this medicine? This drug is injected into a vein. It is given by a health care provider in a hospital or clinic setting. Talk to your health care provider about the use of this drug in children. Special care may be needed. Overdosage: If you think you have taken too much of this medicine contact a poison control center or emergency room at once. NOTE: This medicine is only for you. Do not share this medicine with others. What if I miss a dose? Keep appointments for follow-up doses. It is important not to miss your dose. Call your health care provider if you are unable to keep an appointment. What may interact with this medicine?  certain antibiotics given by injection  NSAIDs, medicines for pain and inflammation, like ibuprofen or naproxen  some diuretics like bumetanide, furosemide  teriparatide  thalidomide This list may not describe all possible interactions. Give your health care provider a list of all the medicines, herbs, non-prescription drugs, or dietary supplements you use. Also tell them if you smoke, drink alcohol, or use illegal drugs. Some items may interact with your medicine. What should I watch for while using this medicine? Visit your health care provider for regular checks on your progress. It may be some time before you see the benefit from this drug. Some people who take this drug have severe bone, joint, or muscle pain. This drug may also increase your risk for jaw problems or a broken thigh bone. Tell your health care provider right away if you have severe pain in your jaw, bones,  joints, or muscles. Tell you health care provider if you have any pain that does not go away or that gets worse. Tell your dentist and dental surgeon that you are taking this drug. You should not have major dental surgery while on this drug. See your dentist to have a dental exam and fix any dental problems before starting this drug. Take good care  of your teeth while on this drug. Make sure you see your dentist for regular follow-up appointments. You should make sure you get enough calcium and vitamin D while you are taking this drug. Discuss the foods you eat and the vitamins you take with your health care provider. Check with your health care provider if you have severe diarrhea, nausea, and vomiting, or if you sweat a lot. The loss of too much body fluid may make it dangerous for you to take this drug. You may need blood work done while you are taking this drug. Do not become pregnant while taking this drug. Women should inform their health care provider if they wish to become pregnant or think they might be pregnant. There is potential for serious harm to an unborn child. Talk to your health care provider for more information. What side effects may I notice from receiving this medicine? Side effects that you should report to your doctor or health care provider as soon as possible:  allergic reactions (skin rash, itching or hives; swelling of the face, lips, or tongue)  bone pain  infection (fever, chills, cough, sore throat, pain or trouble passing urine)  jaw pain, especially after dental work  joint pain  kidney injury (trouble passing urine or change in the amount of urine)  low blood pressure (dizziness; feeling faint or lightheaded, falls; unusually weak or tired)  low calcium levels (fast heartbeat; muscle cramps or pain; pain, tingling, or numbness in the hands or feet; seizures)  low magnesium levels (fast, irregular heartbeat; muscle cramp or pain; muscle weakness; tremors; seizures)  low red blood cell counts (trouble breathing; feeling faint; lightheaded, falls; unusually weak or tired)  muscle pain  redness, blistering, peeling, or loosening of the skin, including inside the mouth  severe diarrhea  swelling of the ankles, feet, hands  trouble breathing Side effects that usually do not require medical  attention (report to your doctor or health care provider if they continue or are bothersome):  anxious  constipation  coughing  depressed mood  eye irritation, itching, or pain  fever  general ill feeling or flu-like symptoms  nausea  pain, redness, or irritation at site where injected  trouble sleeping This list may not describe all possible side effects. Call your doctor for medical advice about side effects. You may report side effects to FDA at 1-800-FDA-1088. Where should I keep my medicine? This drug is given in a hospital or clinic. It will not be stored at home. NOTE: This sheet is a summary. It may not cover all possible information. If you have questions about this medicine, talk to your doctor, pharmacist, or health care provider.  2021 Elsevier/Gold Standard (2019-09-14 09:13:00)

## 2021-03-28 ENCOUNTER — Other Ambulatory Visit: Payer: Self-pay | Admitting: Hematology

## 2021-03-31 ENCOUNTER — Encounter: Payer: Self-pay | Admitting: Hematology

## 2021-03-31 ENCOUNTER — Other Ambulatory Visit: Payer: Self-pay | Admitting: Hematology

## 2021-03-31 DIAGNOSIS — C9000 Multiple myeloma not having achieved remission: Secondary | ICD-10-CM

## 2021-04-01 ENCOUNTER — Other Ambulatory Visit: Payer: Self-pay | Admitting: Physician Assistant

## 2021-04-01 ENCOUNTER — Other Ambulatory Visit: Payer: Self-pay

## 2021-04-01 DIAGNOSIS — C9000 Multiple myeloma not having achieved remission: Secondary | ICD-10-CM

## 2021-04-01 MED ORDER — OXYCODONE HCL 10 MG PO TABS: 10.0000 mg | ORAL_TABLET | Freq: Four times a day (QID) | ORAL | 0 refills | Status: DC | PRN

## 2021-04-02 ENCOUNTER — Inpatient Hospital Stay: Payer: Medicare PPO

## 2021-04-02 ENCOUNTER — Other Ambulatory Visit: Payer: Self-pay

## 2021-04-02 ENCOUNTER — Other Ambulatory Visit: Payer: Medicare PPO

## 2021-04-02 VITALS — BP 119/65 | HR 69 | Temp 99.0°F | Resp 18 | Wt 154.5 lb

## 2021-04-02 DIAGNOSIS — C9000 Multiple myeloma not having achieved remission: Secondary | ICD-10-CM

## 2021-04-02 DIAGNOSIS — Z7189 Other specified counseling: Secondary | ICD-10-CM

## 2021-04-02 DIAGNOSIS — Z79899 Other long term (current) drug therapy: Secondary | ICD-10-CM | POA: Diagnosis not present

## 2021-04-02 DIAGNOSIS — Z95828 Presence of other vascular implants and grafts: Secondary | ICD-10-CM

## 2021-04-02 DIAGNOSIS — Z5112 Encounter for antineoplastic immunotherapy: Secondary | ICD-10-CM | POA: Diagnosis not present

## 2021-04-02 LAB — CBC WITH DIFFERENTIAL (CANCER CENTER ONLY)
Abs Immature Granulocytes: 0.01 10*3/uL (ref 0.00–0.07)
Basophils Absolute: 0 10*3/uL (ref 0.0–0.1)
Basophils Relative: 1 %
Eosinophils Absolute: 0 10*3/uL (ref 0.0–0.5)
Eosinophils Relative: 1 %
HCT: 36.2 % (ref 36.0–46.0)
Hemoglobin: 12.3 g/dL (ref 12.0–15.0)
Immature Granulocytes: 0 %
Lymphocytes Relative: 29 %
Lymphs Abs: 0.9 10*3/uL (ref 0.7–4.0)
MCH: 34.9 pg — ABNORMAL HIGH (ref 26.0–34.0)
MCHC: 34 g/dL (ref 30.0–36.0)
MCV: 102.8 fL — ABNORMAL HIGH (ref 80.0–100.0)
Monocytes Absolute: 0.6 10*3/uL (ref 0.1–1.0)
Monocytes Relative: 20 %
Neutro Abs: 1.5 10*3/uL — ABNORMAL LOW (ref 1.7–7.7)
Neutrophils Relative %: 49 %
Platelet Count: 111 10*3/uL — ABNORMAL LOW (ref 150–400)
RBC: 3.52 MIL/uL — ABNORMAL LOW (ref 3.87–5.11)
RDW: 13.5 % (ref 11.5–15.5)
WBC Count: 3.2 10*3/uL — ABNORMAL LOW (ref 4.0–10.5)
nRBC: 0 % (ref 0.0–0.2)

## 2021-04-02 LAB — CMP (CANCER CENTER ONLY)
ALT: 14 U/L (ref 0–44)
AST: 10 U/L — ABNORMAL LOW (ref 15–41)
Albumin: 3.5 g/dL (ref 3.5–5.0)
Alkaline Phosphatase: 86 U/L (ref 38–126)
Anion gap: 12 (ref 5–15)
BUN: 15 mg/dL (ref 8–23)
CO2: 19 mmol/L — ABNORMAL LOW (ref 22–32)
Calcium: 9.7 mg/dL (ref 8.9–10.3)
Chloride: 109 mmol/L (ref 98–111)
Creatinine: 1.09 mg/dL — ABNORMAL HIGH (ref 0.44–1.00)
GFR, Estimated: 52 mL/min — ABNORMAL LOW (ref >60)
Glucose, Bld: 227 mg/dL — ABNORMAL HIGH (ref 70–99)
Potassium: 3.8 mmol/L (ref 3.5–5.1)
Sodium: 140 mmol/L (ref 135–145)
Total Bilirubin: 0.5 mg/dL (ref 0.3–1.2)
Total Protein: 6.2 g/dL — ABNORMAL LOW (ref 6.5–8.1)

## 2021-04-02 MED ORDER — SODIUM CHLORIDE 0.9 % IV SOLN
Freq: Once | INTRAVENOUS | Status: AC
Start: 2021-04-02 — End: 2021-04-02
  Filled 2021-04-02: qty 250

## 2021-04-02 MED ORDER — PROCHLORPERAZINE MALEATE 10 MG PO TABS
10.0000 mg | ORAL_TABLET | Freq: Once | ORAL | Status: AC
Start: 2021-04-02 — End: 2021-04-02
  Administered 2021-04-02: 10 mg via ORAL

## 2021-04-02 MED ORDER — SODIUM CHLORIDE 0.9% FLUSH
10.0000 mL | INTRAVENOUS | Status: DC | PRN
  Administered 2021-04-02: 10 mL
  Filled 2021-04-02: qty 10

## 2021-04-02 MED ORDER — ACETAMINOPHEN 325 MG PO TABS
ORAL_TABLET | ORAL | Status: AC
  Filled 2021-04-02: qty 2

## 2021-04-02 MED ORDER — FAMOTIDINE 20 MG PO TABS
ORAL_TABLET | ORAL | Status: AC
  Filled 2021-04-02: qty 1

## 2021-04-02 MED ORDER — DEXAMETHASONE 4 MG PO TABS
12.0000 mg | ORAL_TABLET | Freq: Once | ORAL | Status: AC
  Administered 2021-04-02: 12 mg via ORAL

## 2021-04-02 MED ORDER — DEXTROSE 5 % IV SOLN
56.0000 mg/m2 | Freq: Once | INTRAVENOUS | Status: AC
  Administered 2021-04-02: 100 mg via INTRAVENOUS
  Filled 2021-04-02: qty 30

## 2021-04-02 MED ORDER — DIPHENHYDRAMINE HCL 25 MG PO CAPS
ORAL_CAPSULE | ORAL | Status: AC
  Filled 2021-04-02: qty 1

## 2021-04-02 MED ORDER — ACETAMINOPHEN 325 MG PO TABS
650.0000 mg | ORAL_TABLET | Freq: Once | ORAL | Status: AC
  Administered 2021-04-02: 650 mg via ORAL

## 2021-04-02 MED ORDER — SODIUM CHLORIDE 0.9% FLUSH
10.0000 mL | Freq: Once | INTRAVENOUS | Status: AC
  Administered 2021-04-02: 10 mL
  Filled 2021-04-02: qty 10

## 2021-04-02 MED ORDER — HEPARIN SOD (PORK) LOCK FLUSH 100 UNIT/ML IV SOLN
500.0000 [IU] | Freq: Once | INTRAVENOUS | Status: AC | PRN
  Administered 2021-04-02: 500 [IU]
  Filled 2021-04-02: qty 5

## 2021-04-02 MED ORDER — PROCHLORPERAZINE MALEATE 10 MG PO TABS
ORAL_TABLET | ORAL | Status: AC
  Filled 2021-04-02: qty 1

## 2021-04-02 MED ORDER — FAMOTIDINE 20 MG PO TABS
20.0000 mg | ORAL_TABLET | Freq: Once | ORAL | Status: AC
  Administered 2021-04-02: 20 mg via ORAL

## 2021-04-02 MED ORDER — DIPHENHYDRAMINE HCL 25 MG PO TABS
25.0000 mg | ORAL_TABLET | Freq: Once | ORAL | Status: AC
  Administered 2021-04-02: 25 mg via ORAL
  Filled 2021-04-02: qty 1

## 2021-04-02 MED ORDER — DEXAMETHASONE 4 MG PO TABS
ORAL_TABLET | ORAL | Status: AC
  Filled 2021-04-02: qty 3

## 2021-04-02 NOTE — Patient Instructions (Signed)
Implanted Port Insertion, Care After This sheet gives you information about how to care for yourself after your procedure. Your health care provider may also give you more specific instructions. If you have problems or questions, contact your health care provider. What can I expect after the procedure? After the procedure, it is common to have:  Discomfort at the port insertion site.  Bruising on the skin over the port. This should improve over 3-4 days. Follow these instructions at home: Port care  After your port is placed, you will get a manufacturer's information card. The card has information about your port. Keep this card with you at all times.  Take care of the port as told by your health care provider. Ask your health care provider if you or a family member can get training for taking care of the port at home. A home health care nurse may also take care of the port.  Make sure to remember what type of port you have. Incision care  Follow instructions from your health care provider about how to take care of your port insertion site. Make sure you: ? Wash your hands with soap and water before and after you change your bandage (dressing). If soap and water are not available, use hand sanitizer. ? Change your dressing as told by your health care provider. ? Leave stitches (sutures), skin glue, or adhesive strips in place. These skin closures may need to stay in place for 2 weeks or longer. If adhesive strip edges start to loosen and curl up, you may trim the loose edges. Do not remove adhesive strips completely unless your health care provider tells you to do that.  Check your port insertion site every day for signs of infection. Check for: ? Redness, swelling, or pain. ? Fluid or blood. ? Warmth. ? Pus or a bad smell.      Activity  Return to your normal activities as told by your health care provider. Ask your health care provider what activities are safe for you.  Do not  lift anything that is heavier than 10 lb (4.5 kg), or the limit that you are told, until your health care provider says that it is safe. General instructions  Take over-the-counter and prescription medicines only as told by your health care provider.  Do not take baths, swim, or use a hot tub until your health care provider approves. Ask your health care provider if you may take showers. You may only be allowed to take sponge baths.  Do not drive for 24 hours if you were given a sedative during your procedure.  Wear a medical alert bracelet in case of an emergency. This will tell any health care providers that you have a port.  Keep all follow-up visits as told by your health care provider. This is important. Contact a health care provider if:  You cannot flush your port with saline as directed, or you cannot draw blood from the port.  You have a fever or chills.  You have redness, swelling, or pain around your port insertion site.  You have fluid or blood coming from your port insertion site.  Your port insertion site feels warm to the touch.  You have pus or a bad smell coming from the port insertion site. Get help right away if:  You have chest pain or shortness of breath.  You have bleeding from your port that you cannot control. Summary  Take care of the port as told by your   health care provider. Keep the manufacturer's information card with you at all times.  Change your dressing as told by your health care provider.  Contact a health care provider if you have a fever or chills or if you have redness, swelling, or pain around your port insertion site.  Keep all follow-up visits as told by your health care provider. This information is not intended to replace advice given to you by your health care provider. Make sure you discuss any questions you have with your health care provider. Document Revised: 06/28/2018 Document Reviewed: 06/28/2018 Elsevier Patient Education   2021 Elsevier Inc.  

## 2021-04-02 NOTE — Patient Instructions (Addendum)
[facility] Discharge Instructions for Patients Receiving Chemotherapy  Today you received the following chemotherapy agents:  Kyprolis  To help prevent nausea and vomiting after your treatment, we encourage you to take your nausea medication as directed.   If you develop nausea and vomiting that is not controlled by your nausea medication, call the clinic.   BELOW ARE SYMPTOMS THAT SHOULD BE REPORTED IMMEDIATELY:  *FEVER GREATER THAN 100.5 F  *CHILLS WITH OR WITHOUT FEVER  NAUSEA AND VOMITING THAT IS NOT CONTROLLED WITH YOUR NAUSEA MEDICATION  *UNUSUAL SHORTNESS OF BREATH  *UNUSUAL BRUISING OR BLEEDING  TENDERNESS IN MOUTH AND THROAT WITH OR WITHOUT PRESENCE OF ULCERS  *URINARY PROBLEMS  *BOWEL PROBLEMS  UNUSUAL RASH Items with * indicate a potential emergency and should be followed up as soon as possible.  Feel free to call the clinic should you have any questions or concerns. The clinic phone number is [phone].  Please show the CHEMO ALERT CARD at check-in to the Emergency Department and triage nurse.   

## 2021-04-19 ENCOUNTER — Other Ambulatory Visit: Payer: Self-pay | Admitting: Hematology

## 2021-04-19 DIAGNOSIS — C9000 Multiple myeloma not having achieved remission: Secondary | ICD-10-CM

## 2021-04-22 ENCOUNTER — Inpatient Hospital Stay: Payer: Medicare PPO | Attending: Hematology

## 2021-04-22 ENCOUNTER — Other Ambulatory Visit: Payer: Self-pay | Admitting: *Deleted

## 2021-04-22 ENCOUNTER — Inpatient Hospital Stay: Payer: Medicare PPO

## 2021-04-22 ENCOUNTER — Other Ambulatory Visit: Payer: Medicare PPO

## 2021-04-22 ENCOUNTER — Other Ambulatory Visit: Payer: Self-pay

## 2021-04-22 VITALS — BP 139/65 | HR 74 | Temp 98.3°F | Resp 18 | Ht 59.0 in | Wt 156.8 lb

## 2021-04-22 DIAGNOSIS — Z7189 Other specified counseling: Secondary | ICD-10-CM

## 2021-04-22 DIAGNOSIS — Z5112 Encounter for antineoplastic immunotherapy: Secondary | ICD-10-CM | POA: Insufficient documentation

## 2021-04-22 DIAGNOSIS — C9000 Multiple myeloma not having achieved remission: Secondary | ICD-10-CM | POA: Diagnosis not present

## 2021-04-22 DIAGNOSIS — Z7901 Long term (current) use of anticoagulants: Secondary | ICD-10-CM | POA: Diagnosis not present

## 2021-04-22 DIAGNOSIS — Z8052 Family history of malignant neoplasm of bladder: Secondary | ICD-10-CM | POA: Diagnosis not present

## 2021-04-22 DIAGNOSIS — Z79899 Other long term (current) drug therapy: Secondary | ICD-10-CM | POA: Diagnosis not present

## 2021-04-22 DIAGNOSIS — Z95828 Presence of other vascular implants and grafts: Secondary | ICD-10-CM

## 2021-04-22 DIAGNOSIS — R911 Solitary pulmonary nodule: Secondary | ICD-10-CM | POA: Diagnosis not present

## 2021-04-22 DIAGNOSIS — Z780 Asymptomatic menopausal state: Secondary | ICD-10-CM | POA: Diagnosis not present

## 2021-04-22 DIAGNOSIS — Z5111 Encounter for antineoplastic chemotherapy: Secondary | ICD-10-CM

## 2021-04-22 LAB — CBC WITH DIFFERENTIAL/PLATELET
Abs Immature Granulocytes: 0.01 10*3/uL (ref 0.00–0.07)
Basophils Absolute: 0 10*3/uL (ref 0.0–0.1)
Basophils Relative: 1 %
Eosinophils Absolute: 0.2 10*3/uL (ref 0.0–0.5)
Eosinophils Relative: 7 %
HCT: 33.3 % — ABNORMAL LOW (ref 36.0–46.0)
Hemoglobin: 11.5 g/dL — ABNORMAL LOW (ref 12.0–15.0)
Immature Granulocytes: 0 %
Lymphocytes Relative: 28 %
Lymphs Abs: 0.8 10*3/uL (ref 0.7–4.0)
MCH: 35.5 pg — ABNORMAL HIGH (ref 26.0–34.0)
MCHC: 34.5 g/dL (ref 30.0–36.0)
MCV: 102.8 fL — ABNORMAL HIGH (ref 80.0–100.0)
Monocytes Absolute: 0.8 10*3/uL (ref 0.1–1.0)
Monocytes Relative: 30 %
Neutro Abs: 0.9 10*3/uL — ABNORMAL LOW (ref 1.7–7.7)
Neutrophils Relative %: 34 %
Platelets: 103 10*3/uL — ABNORMAL LOW (ref 150–400)
RBC: 3.24 MIL/uL — ABNORMAL LOW (ref 3.87–5.11)
RDW: 14.4 % (ref 11.5–15.5)
WBC: 2.7 10*3/uL — ABNORMAL LOW (ref 4.0–10.5)
nRBC: 0 % (ref 0.0–0.2)

## 2021-04-22 LAB — CMP (CANCER CENTER ONLY)
ALT: 10 U/L (ref 0–44)
AST: 9 U/L — ABNORMAL LOW (ref 15–41)
Albumin: 3.3 g/dL — ABNORMAL LOW (ref 3.5–5.0)
Alkaline Phosphatase: 82 U/L (ref 38–126)
Anion gap: 12 (ref 5–15)
BUN: 11 mg/dL (ref 8–23)
CO2: 20 mmol/L — ABNORMAL LOW (ref 22–32)
Calcium: 9.6 mg/dL (ref 8.9–10.3)
Chloride: 108 mmol/L (ref 98–111)
Creatinine: 0.94 mg/dL (ref 0.44–1.00)
GFR, Estimated: >60 mL/min (ref >60)
Glucose, Bld: 242 mg/dL — ABNORMAL HIGH (ref 70–99)
Potassium: 3.6 mmol/L (ref 3.5–5.1)
Sodium: 140 mmol/L (ref 135–145)
Total Bilirubin: 0.4 mg/dL (ref 0.3–1.2)
Total Protein: 5.8 g/dL — ABNORMAL LOW (ref 6.5–8.1)

## 2021-04-22 MED ORDER — ZOLEDRONIC ACID 4 MG/100ML IV SOLN
4.0000 mg | Freq: Once | INTRAVENOUS | Status: AC
  Administered 2021-04-22: 4 mg via INTRAVENOUS

## 2021-04-22 MED ORDER — HEPARIN SOD (PORK) LOCK FLUSH 100 UNIT/ML IV SOLN
500.0000 [IU] | Freq: Once | INTRAVENOUS | Status: AC | PRN
  Administered 2021-04-22: 500 [IU]
  Filled 2021-04-22: qty 5

## 2021-04-22 MED ORDER — FAMOTIDINE 20 MG PO TABS
ORAL_TABLET | ORAL | Status: AC
  Filled 2021-04-22: qty 1

## 2021-04-22 MED ORDER — PROCHLORPERAZINE MALEATE 10 MG PO TABS
10.0000 mg | ORAL_TABLET | Freq: Once | ORAL | Status: AC
  Administered 2021-04-22: 10 mg via ORAL

## 2021-04-22 MED ORDER — ACETAMINOPHEN 325 MG PO TABS
ORAL_TABLET | ORAL | Status: AC
  Filled 2021-04-22: qty 2

## 2021-04-22 MED ORDER — LENALIDOMIDE 15 MG PO CAPS: ORAL_CAPSULE | ORAL | 0 refills | Status: DC

## 2021-04-22 MED ORDER — FAMOTIDINE 20 MG PO TABS
20.0000 mg | ORAL_TABLET | Freq: Once | ORAL | Status: AC
  Administered 2021-04-22: 20 mg via ORAL

## 2021-04-22 MED ORDER — ACETAMINOPHEN 325 MG PO TABS
650.0000 mg | ORAL_TABLET | Freq: Once | ORAL | Status: AC
  Administered 2021-04-22: 650 mg via ORAL

## 2021-04-22 MED ORDER — SODIUM CHLORIDE 0.9 % IV SOLN
Freq: Once | INTRAVENOUS | Status: AC
Start: 2021-04-22 — End: 2021-04-22
  Filled 2021-04-22: qty 250

## 2021-04-22 MED ORDER — DEXTROSE 5 % IV SOLN
56.0000 mg/m2 | Freq: Once | INTRAVENOUS | Status: AC
  Administered 2021-04-22: 100 mg via INTRAVENOUS
  Filled 2021-04-22: qty 30

## 2021-04-22 MED ORDER — DEXAMETHASONE 4 MG PO TABS
ORAL_TABLET | ORAL | Status: AC
  Filled 2021-04-22: qty 3

## 2021-04-22 MED ORDER — SODIUM CHLORIDE 0.9% FLUSH
10.0000 mL | INTRAVENOUS | Status: DC | PRN
  Administered 2021-04-22: 10 mL
  Filled 2021-04-22: qty 10

## 2021-04-22 MED ORDER — SODIUM CHLORIDE 0.9% FLUSH
10.0000 mL | Freq: Once | INTRAVENOUS | Status: AC
  Administered 2021-04-22: 10 mL
  Filled 2021-04-22: qty 10

## 2021-04-22 MED ORDER — SODIUM CHLORIDE 0.9 % IV SOLN
Freq: Once | INTRAVENOUS | Status: AC
  Filled 2021-04-22: qty 250

## 2021-04-22 MED ORDER — DIPHENHYDRAMINE HCL 25 MG PO CAPS
ORAL_CAPSULE | ORAL | Status: AC
  Filled 2021-04-22: qty 1

## 2021-04-22 MED ORDER — DIPHENHYDRAMINE HCL 25 MG PO TABS
25.0000 mg | ORAL_TABLET | Freq: Once | ORAL | Status: AC
  Administered 2021-04-22: 25 mg via ORAL
  Filled 2021-04-22: qty 1

## 2021-04-22 MED ORDER — DEXAMETHASONE 4 MG PO TABS
12.0000 mg | ORAL_TABLET | Freq: Once | ORAL | Status: AC
  Administered 2021-04-22: 12 mg via ORAL

## 2021-04-22 MED ORDER — PROCHLORPERAZINE MALEATE 10 MG PO TABS
ORAL_TABLET | ORAL | Status: AC
  Filled 2021-04-22: qty 1

## 2021-04-22 MED ORDER — ZOLEDRONIC ACID 4 MG/100ML IV SOLN
INTRAVENOUS | Status: AC
  Filled 2021-04-22: qty 100

## 2021-04-22 NOTE — Patient Instructions (Signed)
[facility] MEDICAL ONCOLOGY  Discharge Instructions: Thank you for choosing [facility] to provide your oncology and hematology care.   If you have a lab appointment with the Cancer Center, please go directly to the Cancer Center and check in at the registration area.   Wear comfortable clothing and clothing appropriate for easy access to any Portacath or PICC line.   We strive to give you quality time with your provider. You may need to reschedule your appointment if you arrive late (15 or more minutes).  Arriving late affects you and other patients whose appointments are after yours.  Also, if you miss three or more appointments without notifying the office, you may be dismissed from the clinic at the provider's discretion.      For prescription refill requests, have your pharmacy contact our office and allow 72 hours for refills to be completed.    Today you received the following chemotherapy and/or immunotherapy agents kyprolis      To help prevent nausea and vomiting after your treatment, we encourage you to take your nausea medication as directed.  BELOW ARE SYMPTOMS THAT SHOULD BE REPORTED IMMEDIATELY: . *FEVER GREATER THAN 100.4 F (38 C) OR HIGHER . *CHILLS OR SWEATING . *NAUSEA AND VOMITING THAT IS NOT CONTROLLED WITH YOUR NAUSEA MEDICATION . *UNUSUAL SHORTNESS OF BREATH . *UNUSUAL BRUISING OR BLEEDING . *URINARY PROBLEMS (pain or burning when urinating, or frequent urination) . *BOWEL PROBLEMS (unusual diarrhea, constipation, pain near the anus) . TENDERNESS IN MOUTH AND THROAT WITH OR WITHOUT PRESENCE OF ULCERS (sore throat, sores in mouth, or a toothache) . UNUSUAL RASH, SWELLING OR PAIN  . UNUSUAL VAGINAL DISCHARGE OR ITCHING   Items with * indicate a potential emergency and should be followed up as soon as possible or go to the Emergency Department if any problems should occur.  Please show the CHEMOTHERAPY ALERT CARD or IMMUNOTHERAPY ALERT  CARD at check-in to the Emergency Department and triage nurse.  Should you have questions after your visit or need to cancel or reschedule your appointment, please contact [facility] MEDICAL ONCOLOG[facility]  Dept: [phone]  and follow the prompts.  Office hours are 8:00 a.m. to 4:30 p.m. Monday - Friday. Please note that voicemails left after 4:00 p.m. may not be returned until the following business day.  We are closed weekends and major holidays. You have access to a nurse at all times for urgent questions. Please call the main number to the clinic Dept: [phone] and follow the prompts.   For any non-urgent questions, you may also contact your provider using MyChart. We now offer e-Visits for anyone 18 and older to request care online for non-urgent symptoms. For details visit [email or web address].   Also download the MyChart app! Go to the app store, search "MyChart", open the app, select , and log in with your MyChart username and password.  Due to Covid, a mask is required upon entering the hospital/clinic. If you do not have a mask, one will be given to you upon arrival. For doctor visits, patients may have 1 support person aged 18 or older with them. For treatment visits, patients cannot have anyone with them due to current Covid guidelines and our immunocompromised population.   

## 2021-04-22 NOTE — Progress Notes (Unsigned)
PK to treat today per Dr Irene Limbo with ANC of 0.9

## 2021-04-24 ENCOUNTER — Ambulatory Visit (HOSPITAL_COMMUNITY)
Admission: RE | Admit: 2021-04-24 | Discharge: 2021-04-24 | Disposition: A | Discharge status: Home / Self Care | Payer: Medicare PPO | Source: Ambulatory Visit | Attending: Hematology | Admitting: Hematology

## 2021-04-24 ENCOUNTER — Other Ambulatory Visit: Payer: Self-pay

## 2021-04-24 ENCOUNTER — Other Ambulatory Visit: Payer: Self-pay | Admitting: Radiology

## 2021-04-24 DIAGNOSIS — Z79899 Other long term (current) drug therapy: Secondary | ICD-10-CM | POA: Diagnosis not present

## 2021-04-24 DIAGNOSIS — C9000 Multiple myeloma not having achieved remission: Secondary | ICD-10-CM | POA: Diagnosis not present

## 2021-04-24 DIAGNOSIS — C7951 Secondary malignant neoplasm of bone: Secondary | ICD-10-CM | POA: Insufficient documentation

## 2021-04-24 LAB — GLUCOSE, CAPILLARY: Glucose-Capillary: 146 mg/dL — ABNORMAL HIGH (ref 70–99)

## 2021-04-25 ENCOUNTER — Other Ambulatory Visit: Payer: Self-pay | Admitting: Hematology

## 2021-04-25 ENCOUNTER — Other Ambulatory Visit: Payer: Self-pay | Admitting: Student

## 2021-04-28 ENCOUNTER — Encounter (HOSPITAL_COMMUNITY): Payer: Self-pay

## 2021-04-28 ENCOUNTER — Other Ambulatory Visit: Payer: Self-pay

## 2021-04-28 ENCOUNTER — Ambulatory Visit (HOSPITAL_COMMUNITY)
Admission: RE | Admit: 2021-04-28 | Discharge: 2021-04-28 | Disposition: A | Discharge status: Home / Self Care | Payer: Medicare PPO | Source: Ambulatory Visit | Attending: Hematology | Admitting: Hematology

## 2021-04-28 DIAGNOSIS — C9000 Multiple myeloma not having achieved remission: Secondary | ICD-10-CM | POA: Diagnosis not present

## 2021-04-28 DIAGNOSIS — D72819 Decreased white blood cell count, unspecified: Secondary | ICD-10-CM | POA: Insufficient documentation

## 2021-04-28 DIAGNOSIS — D7589 Other specified diseases of blood and blood-forming organs: Secondary | ICD-10-CM | POA: Diagnosis not present

## 2021-04-28 DIAGNOSIS — C7951 Secondary malignant neoplasm of bone: Secondary | ICD-10-CM | POA: Insufficient documentation

## 2021-04-28 DIAGNOSIS — D696 Thrombocytopenia, unspecified: Secondary | ICD-10-CM | POA: Diagnosis not present

## 2021-04-28 LAB — CBC WITH DIFFERENTIAL/PLATELET
Abs Immature Granulocytes: 0.03 10*3/uL (ref 0.00–0.07)
Basophils Absolute: 0 10*3/uL (ref 0.0–0.1)
Basophils Relative: 0 %
Eosinophils Absolute: 0.1 10*3/uL (ref 0.0–0.5)
Eosinophils Relative: 3 %
HCT: 38.4 % (ref 36.0–46.0)
Hemoglobin: 12.6 g/dL (ref 12.0–15.0)
Immature Granulocytes: 1 %
Lymphocytes Relative: 24 %
Lymphs Abs: 0.9 10*3/uL (ref 0.7–4.0)
MCH: 34.8 pg — ABNORMAL HIGH (ref 26.0–34.0)
MCHC: 32.8 g/dL (ref 30.0–36.0)
MCV: 106.1 fL — ABNORMAL HIGH (ref 80.0–100.0)
Monocytes Absolute: 1 10*3/uL (ref 0.1–1.0)
Monocytes Relative: 28 %
Neutro Abs: 1.6 10*3/uL — ABNORMAL LOW (ref 1.7–7.7)
Neutrophils Relative %: 44 %
Platelets: 55 10*3/uL — ABNORMAL LOW (ref 150–400)
RBC: 3.62 MIL/uL — ABNORMAL LOW (ref 3.87–5.11)
RDW: 13.9 % (ref 11.5–15.5)
WBC: 3.6 10*3/uL — ABNORMAL LOW (ref 4.0–10.5)
nRBC: 0 % (ref 0.0–0.2)

## 2021-04-28 LAB — PROTIME-INR
INR: 1 (ref 0.8–1.2)
Prothrombin Time: 13.6 seconds (ref 11.4–15.2)

## 2021-04-28 MED ORDER — HEPARIN SOD (PORK) LOCK FLUSH 100 UNIT/ML IV SOLN
INTRAVENOUS | Status: AC
  Filled 2021-04-28: qty 5

## 2021-04-28 MED ORDER — MIDAZOLAM HCL 2 MG/2ML IJ SOLN
INTRAMUSCULAR | Status: AC
  Filled 2021-04-28: qty 4

## 2021-04-28 MED ORDER — LIDOCAINE HCL 1 % IJ SOLN
INTRAMUSCULAR | Status: AC | PRN
  Administered 2021-04-28: 10 mL via INTRADERMAL

## 2021-04-28 MED ORDER — FENTANYL CITRATE (PF) 100 MCG/2ML IJ SOLN
INTRAMUSCULAR | Status: AC
  Filled 2021-04-28: qty 2

## 2021-04-28 MED ORDER — NALOXONE HCL 0.4 MG/ML IJ SOLN
INTRAMUSCULAR | Status: AC
  Filled 2021-04-28: qty 1

## 2021-04-28 MED ORDER — SODIUM CHLORIDE 0.9 % IV SOLN: INTRAVENOUS | Status: DC

## 2021-04-28 MED ORDER — HEPARIN SOD (PORK) LOCK FLUSH 100 UNIT/ML IV SOLN
500.0000 [IU] | INTRAVENOUS | Status: AC | PRN
  Administered 2021-04-28: 500 [IU]

## 2021-04-28 MED ORDER — FENTANYL CITRATE (PF) 100 MCG/2ML IJ SOLN
INTRAMUSCULAR | Status: AC | PRN
  Administered 2021-04-28: 50 ug via INTRAVENOUS

## 2021-04-28 MED ORDER — MIDAZOLAM HCL 2 MG/2ML IJ SOLN
INTRAMUSCULAR | Status: AC | PRN
  Administered 2021-04-28 (×2): 1 mg via INTRAVENOUS

## 2021-04-28 MED ORDER — FLUMAZENIL 0.5 MG/5ML IV SOLN
INTRAVENOUS | Status: AC
  Filled 2021-04-28: qty 5

## 2021-04-28 NOTE — H&P (Signed)
Referring Physician(s): Brunetta Genera  Supervising Physician: Daryll Brod  Patient Status:  [facility]  Chief Complaint:  "I'm here for a bone marrow biopsy"  Subjective: Patient familiar to IR service from bone marrow biopsies x3 in 2020 and Port-A-Cath placement in 2020.  She has a history of multiple myeloma, initially diagnosed in 2018, and presents today for CT-guided bone marrow biopsy to assess treatment response.  She currently denies fever, headache, chest pain, dyspnea, cough, abdominal/back pain, nausea, vomiting or bleeding.  Past Medical History:  Diagnosis Date  . Allergy    seasonal  . Asthma   . DEPRESSION   . DIABETES MELLITUS, TYPE II   . Diverticulosis   . HYPERLIPIDEMIA   . Macular degeneration of left eye    mild, Dr.Hecker  . Obesity, unspecified   . Osteoarthritis of both knees   . OSTEOPENIA   . Osteopenia   . URINARY INCONTINENCE    Past Surgical History:  Procedure Laterality Date  . CATARACT EXTRACTION Left 05/24/2018  . CESAREAN SECTION  01/1973  . CYSTOSCOPY/URETEROSCOPY/HOLMIUM LASER/STENT PLACEMENT Right 09/20/2019   Procedure: CYSTOSCOPY/URETEROSCOPY/HOLMIUM LASER/STENT PLACEMENT;  Surgeon: Lucas Mallow, MD;  Location: [facility];  Service: Urology;  Laterality: Right;  . FRACTURE SURGERY    . IR IMAGING GUIDED PORT INSERTION  02/20/2019  . left wrist surgery  2008   By Dr. Latanya Maudlin  . right ankle  1994      Allergies: Penicillins, Aleve [naproxen sodium], and Sulfonamide derivatives  Medications: Prior to Admission medications   Medication Sig Start Date End Date Taking? Authorizing Provider  acyclovir (ZOVIRAX) 400 MG tablet TAKE 1 TABLET(400 MG) BY MOUTH TWICE DAILY 12/31/20   Brunetta Genera, MD  Blood Glucose Monitoring Suppl (FREESTYLE FREEDOM LITE) W/DEVICE KIT Use to check blood sugars twice a day Dx 250.00 06/01/14   Rowe Clack, MD  Calcium Carbonate-Vitamin D (CALCIUM 600+D HIGH  POTENCY) 600-400 MG-UNIT per tablet Take 1 tablet by mouth 2 (two) times daily.     [provider]  Cetirizine HCl 10 MG CAPS Take 1 capsule (10 mg total) by mouth daily. 03/09/19   Tanner, Lyndon Code., PA-C  clindamycin (CLEOCIN) 300 MG capsule Take 2 capsules (600 mg total) by mouth once as needed for up to 1 dose (1 hour prior to dental procedures). 03/05/21   Brunetta Genera, MD  dexamethasone (DECADRON) 4 MG tablet Take 5 tablets (20 mg total) by mouth once a week. On D22 of each cycle of treatment 03/09/19   Brunetta Genera, MD  doxycycline (VIBRA-TABS) 100 MG tablet Take 1 tablet (100 mg total) by mouth 2 (two) times daily. Patient not taking: Reported on 09/11/2020 03/14/20   Plotnikov, Evie Lacks, MD  ELIQUIS 2.5 MG TABS tablet TAKE 1 TABLET(2.5 MG) BY MOUTH TWICE DAILY 04/25/21   Brunetta Genera, MD  fentaNYL (DURAGESIC) 12 MCG/HR Place 1 patch onto the skin every 3 (three) days. 03/17/21   Brunetta Genera, MD  fluticasone (FLONASE) 50 MCG/ACT nasal spray Place 1 spray into both nostrils daily. 12/11/20   Hoyt Koch, MD  glipiZIDE (GLUCOTROL XL) 5 MG 24 hr tablet TAKE 1 TABLET(5 MG) BY MOUTH DAILY WITH BREAKFAST 03/11/21   Hoyt Koch, MD  glucose blood (FREESTYLE LITE) test strip CHECK BLOOD SUGAR TWICE DAILY AS DIRECTED Dx 250.00 07/13/14   Rowe Clack, MD  Lancets (FREESTYLE) lancets Use twice daily to check sugars. 04/15/16   Pricilla Holm  A, MD  lenalidomide (REVLIMID) 15 MG capsule TAKE 1 CAPSULE BY MOUTH  DAILY FOR 21 DAYS, THEN 7  DAYS OFF 04/22/21   Kale, Gautam Kishore, MD  lidocaine-prilocaine (EMLA) cream APPLY 1 APPLICATION TO THE AFFECTED AREA AS NEEDED. USE PRIOR TO PORT ACCESS 12/31/20   Kale, Gautam Kishore, MD  LORazepam (ATIVAN) 0.5 MG tablet Take 1 tablet (0.5 mg total) by mouth every 8 (eight) hours as needed for anxiety (significant essential tremors). 04/27/19   Kale, Gautam Kishore, MD  metFORMIN (GLUCOPHAGE-XR) 500 MG 24 hr  tablet TAKE 3 TABLETS(1500 MG) BY MOUTH DAILY WITH BREAKFAST 10/07/20   Crawford, Elizabeth A, MD  methylPREDNISolone (MEDROL DOSEPAK) 4 MG TBPK tablet As directed Patient not taking: Reported on 09/11/2020 03/14/20   Plotnikov, Aleksei V, MD  Multiple Vitamins-Minerals (ICAPS) CAPS Take 1 capsule by mouth daily after breakfast.     [provider]  ondansetron (ZOFRAN) 8 MG tablet Take 1 tablet (8 mg total) by mouth 2 (two) times daily as needed (Nausea or vomiting). 08/20/20   Kale, Gautam Kishore, MD  Oxycodone HCl 10 MG TABS Take 1 tablet (10 mg total) by mouth every 6 (six) hours as needed. 04/01/21   Thayil, Irene T, PA-C  pantoprazole (PROTONIX) 20 MG tablet TAKE 1 TABLET(20 MG) BY MOUTH DAILY 12/11/20   Crawford, Elizabeth A, MD  polyethylene glycol (MIRALAX / GLYCOLAX) packet Take 17 g by mouth daily after breakfast.  Patient not taking: Reported on 09/11/2020    [provider]  potassium chloride SA (KLOR-CON) 20 MEQ tablet TAKE 1 TABLET(20 MEQ) BY MOUTH TWICE DAILY 12/31/20   Kale, Gautam Kishore, MD  prochlorperazine (COMPAZINE) 10 MG tablet Take 1 tablet (10 mg total) by mouth every 6 (six) hours as needed (Nausea or vomiting). 01/16/19   Kale, Gautam Kishore, MD  senna-docusate (SENNA S) 8.6-50 MG tablet Take 2 tablets by mouth at bedtime. 03/26/20   Kale, Gautam Kishore, MD  sertraline (ZOLOFT) 50 MG tablet Take 1 tablet (50 mg total) by mouth daily. 12/27/20   Crawford, Elizabeth A, MD  simvastatin (ZOCOR) 20 MG tablet TAKE 1 TABLET BY MOUTH EVERY DAY AT 6 PM 12/20/20   Crawford, Elizabeth A, MD  Triamcinolone Acetonide 0.025 % LOTN Apply 1 application topically 3 (three) times daily as needed (rash/itching). 03/09/19   Tanner, Van E., PA-C  Vitamin D, Ergocalciferol, (DRISDOL) 1.25 MG (50000 UNIT) CAPS capsule TAKE 1 CAPSULE BY MOUTH EVERY 7 DAYS 04/01/21   Kale, Gautam Kishore, MD     Vital Signs:pending  LMP 10/09/2012   Physical Exam awake, alert.  Chest clear to  auscultation bilaterally.  Heart with regular rate and rhythm.  Abdomen soft, positive bowel sounds, nontender.  No lower extremity edema.  Imaging: NM PET Image Restage (PS) Whole Body  Result Date: 04/25/2021 CLINICAL DATA:  Subsequent treatment strategy for myeloma. EXAM: NUCLEAR MEDICINE PET WHOLE BODY TECHNIQUE: 7.8 mCi F-18 FDG was injected intravenously. Full-ring PET imaging was performed from the head to foot after the radiotracer. CT data was obtained and used for attenuation correction and anatomic localization. Fasting blood glucose: 146 mg/dl COMPARISON:  08/27/2020 FINDINGS: Mediastinal blood pool activity: SUV max 2.5 HEAD/NECK: Stable asymmetric hypermetabolism in the left parapharyngeal space. Incidental CT findings: none CHEST: No hypermetabolic mediastinal or hilar nodes. No suspicious pulmonary nodules on the CT scan. Incidental CT findings: Coronary artery calcification is evident. Atherosclerotic calcification is noted in the wall of the thoracic aorta. Right Port-A-Cath tip is positioned in the   mid to distal SVC. ABDOMEN/PELVIS: No abnormal hypermetabolic activity within the liver, pancreas, adrenal glands, or spleen. No hypermetabolic lymph nodes in the abdomen or pelvis. Incidental CT findings: none SKELETON: As before, FDG accumulation is identified in multiple bilateral nonacute rib fractures. Uptake in the proximal left humerus is increased and associated with a sclerotic lesion (axial 82/4), demonstrating SUV max = 5.2. Multiple lucent lesions are identified in the spine without associated hypermetabolism. Stable. Incidental CT findings: none EXTREMITIES: No abnormal hypermetabolic activity in the lower extremities. Incidental CT findings: none IMPRESSION: 1. Interval progression of hypermetabolism associated with the sclerotic lesion in the proximal left humerus. Site of active myeloma not excluded. Consider x-rays of the left humerus to further evaluate. 2. Otherwise similar exam  with uptake identified in multiple nonacute bilateral rib fractures compatible with healing. 3. Stable focal hypermetabolism in the left parapharyngeal/deep parotid region. Electronically Signed   By: Misty Stanley M.D.   On: 04/25/2021 10:37    Labs:  CBC: Recent Labs    03/05/21 0918 03/25/21 0845 04/02/21 1337 04/22/21 0901  WBC 3.5* 3.3* 3.2* 2.7*  HGB 11.6* 12.1 12.3 11.5*  HCT 34.9* 35.5* 36.2 33.3*  PLT 136* 88* 111* 103*    COAGS: No results for input(s): INR, APTT in the last 8760 hours.  BMP: Recent Labs    07/31/20 1016 08/13/20 0936 08/28/20 1113 09/11/20 0915 09/30/20 0930 03/05/21 0918 03/25/21 0845 04/02/21 1337 04/22/21 0901  NA 139 139 139 139   < > 141 141 140 140  K 3.3* 3.1* 3.2* 3.2*   < > 3.9 3.5 3.8 3.6  CL 109 108 110 108   < > 109 107 109 108  CO2 21* 22 21* 23   < > 21* 21* 19* 20*  GLUCOSE 189* 217* 212* 214*   < > 191* 173* 227* 242*  BUN _0 < > _1 CALCIUM 10.1 10.0 9.5 9.1   < > 9.6 9.5 9.7 9.6  CREATININE 0.95 0.90 0.93 0.99   < > 1.06* 0.93 1.09* 0.94  GFRNONAA 58* >60 59* 55*   < > 54* >60 52* >60  GFRAA >60 >60 >60 >60  --   --   --   --   --    < > = values in this interval not displayed.    LIVER FUNCTION TESTS: Recent Labs    03/05/21 0918 03/25/21 0845 04/02/21 1337 04/22/21 0901  BILITOT 0.5 0.8 0.5 0.4  AST 10* 14* 10* 9*  ALT _2 ALKPHOS 92 81 86 82  PROT 6.0* 6.0* 6.2* 5.8*  ALBUMIN 3.2* 3.4* 3.5 3.3*    Assessment and Plan: Patient familiar to IR service from bone marrow biopsies x3 in 2020 and Port-A-Cath placement in 2020.  She has a history of multiple myeloma, initially diagnosed in 2018, and presents today for CT-guided bone marrow biopsy to assess treatment response.  Risks and benefits of procedure was discussed with the patient  including, but not limited to bleeding, infection, damage to adjacent structures or low yield requiring additional tests.  All of the questions were  answered and there is agreement to proceed.  Consent signed and in chart.     Electronically Signed: D. Rowe Robert, PA-C 04/28/2021, 9:11 AM   I spent a total of 20 minutes at the the patient's bedside AND on the patient's hospital floor or unit, greater than 50% of which was counseling/coordinating care  for CT-guided bone marrow biopsy      

## 2021-04-28 NOTE — Procedures (Signed)
Interventional Radiology Procedure Note  Procedure: CT BM ASP AND CORE    Complications: None  Estimated Blood Loss:  MIN  Findings: 11 G CORE AND ASP    M. TREVOR Meleane Selinger, MD    

## 2021-04-28 NOTE — Discharge Instructions (Signed)
For questions /concerns may call Interventional Radiology at [phone]  You may remove your dressing and shower tomorrow afternoon    Bone Marrow Aspiration and Bone Marrow Biopsy, Adult, Care After This sheet gives you information about how to care for yourself after your procedure. Your health care provider may also give you more specific instructions. If you have problems or questions, contact your health care provider. What can I expect after the procedure? After the procedure, it is common to have:  Mild pain and tenderness.  Swelling.  Bruising. Follow these instructions at home: Puncture site care  Follow instructions from your health care provider about how to take care of the puncture site. Make sure you: ? Wash your hands with soap and water before and after you change your bandage (dressing). If soap and water are not available, use hand sanitizer. ? Change your dressing as told by your health care provider.  Check your puncture site every day for signs of infection. Check for: ? More redness, swelling, or pain. ? Fluid or blood. ? Warmth. ? Pus or a bad smell.   Activity  Return to your normal activities as told by your health care provider. Ask your health care provider what activities are safe for you.  Do not lift anything that is heavier than 10 lb (4.5 kg), or the limit that you are told, until your health care provider says that it is safe.  Do not drive for 24 hours if you were given a sedative during your procedure. General instructions  Take over-the-counter and prescription medicines only as told by your health care provider.  Do not take baths, swim, or use a hot tub until your health care provider approves. Ask your health care provider if you may take showers. You may only be allowed to take sponge baths.  If directed, put ice on the affected area. To do this: ? Put ice in a plastic bag. ? Place a towel between your skin and the bag. ? Leave the  ice on for 20 minutes, 2-3 times a day.  Keep all follow-up visits as told by your health care provider. This is important.   Contact a health care provider if:  Your pain is not controlled with medicine.  You have a fever.  You have more redness, swelling, or pain around the puncture site.  You have fluid or blood coming from the puncture site.  Your puncture site feels warm to the touch.  You have pus or a bad smell coming from the puncture site. Summary  After the procedure, it is common to have mild pain, tenderness, swelling, and bruising.  Follow instructions from your health care provider about how to take care of the puncture site and what activities are safe for you.  Take over-the-counter and prescription medicines only as told by your health care provider.  Contact a health care provider if you have any signs of infection, such as fluid or blood coming from the puncture site. This information is not intended to replace advice given to you by your health care provider. Make sure you discuss any questions you have with your health care provider.   Moderate Conscious Sedation, Adult, Care After This sheet gives you information about how to care for yourself after your procedure. Your health care provider may also give you more specific instructions. If you have problems or questions, contact your health care provider. What can I expect after the procedure? After the procedure, it is common to   have:  Sleepiness for several hours.  Impaired judgment for several hours.  Difficulty with balance.  Vomiting if you eat too soon. Follow these instructions at home: For the time period you were told by your health care provider:  Rest.  Do not participate in activities where you could fall or become injured.  Do not drive or use machinery.  Do not drink alcohol.  Do not take sleeping pills or medicines that cause drowsiness.  Do not make important decisions or sign  legal documents.  Do not take care of children on your own.      Eating and drinking  Follow the diet recommended by your health care provider.  Drink enough fluid to keep your urine pale yellow.  If you vomit: ? Drink water, juice, or soup when you can drink without vomiting. ? Make sure you have little or no nausea before eating solid foods.   General instructions  Take over-the-counter and prescription medicines only as told by your health care provider.  Have a responsible adult stay with you for the time you are told. It is important to have someone help care for you until you are awake and alert.  Do not smoke.  Keep all follow-up visits as told by your health care provider. This is important. Contact a health care provider if:  You are still sleepy or having trouble with balance after 24 hours.  You feel light-headed.  You keep feeling nauseous or you keep vomiting.  You develop a rash.  You have a fever.  You have redness or swelling around the IV site. Get help right away if:  You have trouble breathing.  You have new-onset confusion at home. Summary  After the procedure, it is common to feel sleepy, have impaired judgment, or feel nauseous if you eat too soon.  Rest after you get home. Know the things you should not do after the procedure.  Follow the diet recommended by your health care provider and drink enough fluid to keep your urine pale yellow.  Get help right away if you have trouble breathing or new-onset confusion at home. This information is not intended to replace advice given to you by your health care provider. Make sure you discuss any questions you have with your health care provider. Document Revised: 03/29/2020 Document Reviewed: 10/26/2019 [employer] Patient Education  2021 [employer]  

## 2021-04-28 NOTE — Progress Notes (Signed)
HEMATOLOGY/ONCOLOGY CLINIC NOTE  Date of Service: 04/29/2021  Patient Care Team: Hoyt Koch, MD as PCP - General (Internal Medicine) Lafayette Dragon, MD (Inactive) as Consulting Physician (Gastroenterology) Megan Salon, MD as Consulting Physician (Gynecology) Melrose Nakayama, MD as Consulting Physician (Orthopedic Surgery) Deneise Lever, MD as Consulting Physician (Pulmonary Disease) [name], MD (Ophthalmology)  CHIEF COMPLAINTS/PURPOSE OF CONSULTATION:  Continue mx of myeloma  HISTORY OF PRESENTING ILLNESS:   Faith Orr is a wonderful 80 y.o. female who has been referred to [name] by Dr. Pricilla Holm for evaluation and management of Lytic bone lesions. She is accompanied today by her son in law, and her partner is present via phone. The pt reports that she is doing well overall.   The pt notes that 2-3 months ago while standing at the stove, and otherwise feeling normally, she felt "something snap that took her breath away" in her mid back as she stretched to get something. The pt notes that she saw a chiropractor twice due to her back stiffness, which did not help. The pt then described her new bone pains to her PCP on 12/05/18, and subsequent imaging, as noted below, revealed concerns for numerous bone lesions. She has begun 57m Fosamax. The pt notes that she had hip pain in 2018, and that an XR at that time did not reveal any lesions.  The pt notes that most of her pain is concentrated to her left shoulder presently, and with minimal movement of the arm. She endorses pain radiating into her left arm and notes that her hand is swollen in the mornings when she wakes up. She also endorses present back pain and right hip pain, worse when she walks. She has not yet seen orthopedics. The pt reports that she is needing to take 6044mAdvil every 4-6 hours as Tramadol alone has not been able to alleviate her pain. The pt denies any unexpected weight loss, fevers,  chills, or night sweats. The pt notes that her urine is a very dark color presently, but denies overt blood in the urine, underpants, nor tissue paper. The pt notes that in the last two weeks she has had some soreness in her head, but denies new headaches or changes in vision. The pt notes that she has been urinating more frequently overall, but has been trying to stay better hydrated as well.  The pt notes that she has been compliant with annual mammograms.   The pt notes that she had a cyst in her right breast which was removed in the past. She fractured her left wrist in 2008 after falling down stairs. The pt endorses history of fatty liver.   The pt denies ever smoking cigarettes and endorses significant second hand smoke exposure with a previous marriage.   Of note prior to the patient's visit today, pt has had a Bone Scan completed on 12/13/18 with results revealing Multifocal uptake throughout the skeleton, consistent with diffuse metastatic disease. Primary tumor is not specified. 2. Uptake in the proximal right femur, consistent with lytic lesions. 3. Uptake in the ribs bilaterally as described. 4. Lesions in the proximal left humerus. 5. Diffuse uptake throughout the skull consistent with metastatic disease. 6. Right paramedian uptake at the manubrium.  Most recent lab results (12/08/18) of CBC w/diff and CMP is as follows: all values are WNL except for Glucose at 279, BUN at 24, AST at 41, ALT at 46. 12/08/18 SPEP revealed all values WNL except for Total  Protein at 6.0, Albumin at 3.6, Gamma globulin at 0.7, and M spike at 0.5g  On review of systems, pt reports significant left shoulder pain, back pain, right hip pain, dark urine, and denies fevers, chills, night sweats, unexpected weight loss, changes in bowel habits, changes in breathing, cough, new respiratory symptoms, changes in vision, abdominal pains, leg swelling, and any other symptoms.   On PMHx the pt reports fatty liver, and  denies blood clots.  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 Denies ever smoking.  On Family Hx the pt reports maternal grandmother with colon cancer. Father with bladder cancer and amyloidosis (pt notes that this could have been misdiagnosis). Mother with Protein S deficiency and polymyalgia rheumatica.  Current Treatment: Carfilzomib + Revlimid maintenance.  INTERVAL HISTORY:   Faith Orr returns today for management and evaluation of her Multiple Myeloma. The patient's last visit with [name] was on 03/05/2021. The pt reports that she is doing well overall.  The pt reports no new symptoms or concerns since the last visit. The pt notes that she is very sore today from her biopsy she had yesterday. The pt has been eating and sleeping well. The pt is tolerating her continued maintenance treatment well, but not some continued fatigue. She is currently in her week off from Revlimid. The pt denies any new excessive use of her left arm and notes she has lingering pain there for over two years. This is not new nor worsened within the last few months.  Of note since the patient's last visit, pt has had CT Bone Marrow Biopsy and Aspiration ([id number]) on 04/28/2021. The results have not been read yet.  The pt has also had PET Whole Body ([id number]) on 04/24/2021, which revealed "1. Interval progression of hypermetabolism associated with the sclerotic lesion in the proximal left humerus. Site of active myeloma not excluded. Consider x-rays of the left humerus to further evaluate. 2. Otherwise similar exam with uptake identified in multiple nonacute bilateral rib fractures compatible with healing. 3. Stable focal hypermetabolism in the left parapharyngeal / deep parotid region."  Lab results yesterday 04/28/2021 of CBC w/diff is as follows: all values are WNL except for WBC of 3.6K, RBC of 3.62, MCV of 106.1, MCH of 34.8, Plt of 55K, Neutro Abs of  1.6K.  On review of systems, pt denies new bone pains, leg swelling, fevers, chills, and any other symptoms.  MEDICAL HISTORY:  Past Medical History:  Diagnosis Date  . Allergy    seasonal  . Asthma   . DEPRESSION   . DIABETES MELLITUS, TYPE II   . Diverticulosis   . HYPERLIPIDEMIA   . Macular degeneration of left eye    mild, Dr.Hecker  . Obesity, unspecified   . Osteoarthritis of both knees   . OSTEOPENIA   . Osteopenia   . URINARY INCONTINENCE     SURGICAL HISTORY: Past Surgical History:  Procedure Laterality Date  . CATARACT EXTRACTION Left 05/24/2018  . CESAREAN SECTION  01/1973  . CYSTOSCOPY/URETEROSCOPY/HOLMIUM LASER/STENT PLACEMENT Right 09/20/2019   Procedure: CYSTOSCOPY/URETEROSCOPY/HOLMIUM LASER/STENT PLACEMENT;  Surgeon: Lucas Mallow, MD;  Location: [facility];  Service: Urology;  Laterality: Right;  . FRACTURE SURGERY    . IR IMAGING GUIDED PORT INSERTION  02/20/2019  . left wrist surgery  2008   By Dr. Latanya Maudlin  . right ankle  1994    SOCIAL HISTORY: Social History   Socioeconomic History  .  Marital status: Married    Spouse name: Not on file  . Number of children: 1  . Years of education: Not on file  . Highest education level: Not on file  Occupational History    Employer: [employer]  Tobacco Use  . Smoking status: Never Smoker  . Smokeless tobacco: Never Used  . Tobacco comment: Lives with partner [name]) and son  Vaping Use  . Vaping Use: Never used  Substance and Sexual Activity  . Alcohol use: No    Alcohol/week: 0.0 standard drinks  . Drug use: No  . Sexual activity: Never    Partners: Female    Birth control/protection: Post-menopausal    Comment: Lives with female partner (annette hicks) and 10 yo son  Other Topics Concern  . Not on file  Social History Narrative  . Not on file   Social Determinants of Health   Financial Resource Strain: Not on file  Food Insecurity: Not on file   Transportation Needs: Not on file  Physical Activity: Not on file  Stress: Not on file  Social Connections: Not on file  Intimate Partner Violence: Not on file    FAMILY HISTORY: Family History  Problem Relation Age of Onset  . Diabetes Father   . Hyperlipidemia Father   . Heart disease Father   . Cancer Father   . Hypertension Father   . Colon cancer Paternal Grandmother 94  . Osteoporosis Mother   . Protein S deficiency Mother   . Hyperlipidemia Mother   . Multiple sclerosis Daughter   . Cancer Other        bladder  . Breast cancer Neg Hx     ALLERGIES:  is allergic to penicillins, aleve [naproxen sodium], and sulfonamide derivatives.  MEDICATIONS:  Current Outpatient Medications  Medication Sig Dispense Refill  . acyclovir (ZOVIRAX) 400 MG tablet TAKE 1 TABLET(400 MG) BY MOUTH TWICE DAILY 60 tablet 5  . Blood Glucose Monitoring Suppl (FREESTYLE FREEDOM LITE) W/DEVICE KIT Use to check blood sugars twice a day Dx 250.00 1 each 0  . Calcium Carbonate-Vitamin D (CALCIUM 600+D HIGH POTENCY) 600-400 MG-UNIT per tablet Take 1 tablet by mouth 2 (two) times daily.     . Cetirizine HCl 10 MG CAPS Take 1 capsule (10 mg total) by mouth daily. 30 capsule 1  . clindamycin (CLEOCIN) 300 MG capsule Take 2 capsules (600 mg total) by mouth once as needed for up to 1 dose (1 hour prior to dental procedures). 2 capsule 0  . dexamethasone (DECADRON) 4 MG tablet Take 5 tablets (20 mg total) by mouth once a week. On D22 of each cycle of treatment 20 tablet 5  . doxycycline (VIBRA-TABS) 100 MG tablet Take 1 tablet (100 mg total) by mouth 2 (two) times daily. (Patient not taking: Reported on 09/11/2020) 14 tablet 1  . ELIQUIS 2.5 MG TABS tablet TAKE 1 TABLET(2.5 MG) BY MOUTH TWICE DAILY 60 tablet 2  . fentaNYL (DURAGESIC) 12 MCG/HR Place 1 patch onto the skin every 3 (three) days. 10 patch 0  . fluticasone (FLONASE) 50 MCG/ACT nasal spray Place 1 spray into both nostrils daily. 16 g 2  .  glipiZIDE (GLUCOTROL XL) 5 MG 24 hr tablet TAKE 1 TABLET(5 MG) BY MOUTH DAILY WITH BREAKFAST 90 tablet 1  . glucose blood (FREESTYLE LITE) test strip CHECK BLOOD SUGAR TWICE DAILY AS DIRECTED Dx 250.00 180 each 3  . Lancets (FREESTYLE) lancets Use twice daily to check sugars. 100 each 11  .  lenalidomide (REVLIMID) 15 MG capsule TAKE 1 CAPSULE BY MOUTH  DAILY FOR 21 DAYS, THEN 7  DAYS OFF 21 capsule 0  . lidocaine-prilocaine (EMLA) cream APPLY 1 APPLICATION TO THE AFFECTED AREA AS NEEDED. USE PRIOR TO PORT ACCESS 30 g 0  . LORazepam (ATIVAN) 0.5 MG tablet Take 1 tablet (0.5 mg total) by mouth every 8 (eight) hours as needed for anxiety (significant essential tremors). 60 tablet 0  . metFORMIN (GLUCOPHAGE-XR) 500 MG 24 hr tablet TAKE 3 TABLETS(1500 MG) BY MOUTH DAILY WITH BREAKFAST 270 tablet 1  . methylPREDNISolone (MEDROL DOSEPAK) 4 MG TBPK tablet As directed (Patient not taking: Reported on 09/11/2020) 21 tablet 0  . Multiple Vitamins-Minerals (ICAPS) CAPS Take 1 capsule by mouth daily after breakfast.    . ondansetron (ZOFRAN) 8 MG tablet Take 1 tablet (8 mg total) by mouth 2 (two) times daily as needed (Nausea or vomiting). 30 tablet 1  . Oxycodone HCl 10 MG TABS Take 1 tablet (10 mg total) by mouth every 6 (six) hours as needed. 90 tablet 0  . pantoprazole (PROTONIX) 20 MG tablet TAKE 1 TABLET(20 MG) BY MOUTH DAILY 30 tablet 5  . polyethylene glycol (MIRALAX / GLYCOLAX) packet Take 17 g by mouth daily after breakfast.    . potassium chloride SA (KLOR-CON) 20 MEQ tablet TAKE 1 TABLET(20 MEQ) BY MOUTH TWICE DAILY 180 tablet 0  . prochlorperazine (COMPAZINE) 10 MG tablet Take 1 tablet (10 mg total) by mouth every 6 (six) hours as needed (Nausea or vomiting). 30 tablet 1  . senna-docusate (SENNA S) 8.6-50 MG tablet Take 2 tablets by mouth at bedtime. 60 tablet 2  . sertraline (ZOLOFT) 50 MG tablet Take 1 tablet (50 mg total) by mouth daily. 90 tablet 3  . simvastatin (ZOCOR) 20 MG tablet TAKE 1  TABLET BY MOUTH EVERY DAY AT 6 PM 30 tablet 2  . Triamcinolone Acetonide 0.025 % LOTN Apply 1 application topically 3 (three) times daily as needed (rash/itching). 60 mL 2  . Vitamin D, Ergocalciferol, (DRISDOL) 1.25 MG (50000 UNIT) CAPS capsule TAKE 1 CAPSULE BY MOUTH EVERY 7 DAYS 12 capsule 0   No current facility-administered medications for this visit.   Facility-Administered Medications Ordered in Other Visits  Medication Dose Route Frequency Provider Last Rate Last Admin  . heparin lock flush 100 unit/mL  500 Units Intracatheter Once PRN Brunetta Genera, MD      . sodium chloride flush (NS) 0.9 % injection 10 mL  10 mL Intracatheter PRN Brunetta Genera, MD   10 mL at 10/02/19 1524    REVIEW OF SYSTEMS:   10 Point review of Systems was done is negative except as noted above.  PHYSICAL EXAMINATION: ECOG FS:2 - Symptomatic, <50% confined to bed  Vitals:   04/29/21 0945  BP: 124/65  Pulse: 78  Resp: 18  Temp: (!) 97.2 F (36.2 C)  SpO2: 100%   Wt Readings from Last 3 Encounters:  04/29/21 154 lb 3.2 oz (69.9 kg)  04/28/21 156 lb (70.8 kg)  04/22/21 156 lb 12 oz (71.1 kg)   Body mass index is 31.14 kg/m.    Exam was given in a chair.   GENERAL:alert, in no acute distress and comfortable SKIN: no acute rashes, no significant lesions EYES: conjunctiva are pink and non-injected, sclera anicteric OROPHARYNX: MMM, no exudates, no oropharyngeal erythema or ulceration NECK: supple, no JVD LYMPH:  no palpable lymphadenopathy in the cervical, axillary or inguinal regions LUNGS: clear to auscultation b/l with  normal respiratory effort HEART: regular rate & rhythm ABDOMEN:  normoactive bowel sounds , non tender, not distended. Extremity: no pedal edema PSYCH: alert & oriented x 3 with fluent speech NEURO: no focal motor/sensory deficits   LABORATORY DATA:  I have reviewed the data as listed  . CBC Latest Ref Rng & Units 04/28/2021 04/22/2021 04/02/2021  WBC 4.0 -  10.5 K/uL 3.6(L) 2.7(L) 3.2(L)  Hemoglobin 12.0 - 15.0 g/dL 12.6 11.5(L) 12.3  Hematocrit 36.0 - 46.0 % 38.4 33.3(L) 36.2  Platelets 150 - 400 K/uL 55(L) 103(L) 111(L)   . CBC    Component Value Date/Time   WBC 3.6 (L) 04/28/2021 0930   RBC 3.62 (L) 04/28/2021 0930   HGB 12.6 04/28/2021 0930   HGB 12.3 04/02/2021 1337   HCT 38.4 04/28/2021 0930   PLT 55 (L) 04/28/2021 0930   PLT 111 (L) 04/02/2021 1337   MCV 106.1 (H) 04/28/2021 0930   MCH 34.8 (H) 04/28/2021 0930   MCHC 32.8 04/28/2021 0930   RDW 13.9 04/28/2021 0930   LYMPHSABS 0.9 04/28/2021 0930   MONOABS 1.0 04/28/2021 0930   EOSABS 0.1 04/28/2021 0930   BASOSABS 0.0 04/28/2021 0930    . CMP Latest Ref Rng & Units 04/22/2021 04/02/2021 03/25/2021  Glucose 70 - 99 mg/dL 242(H) 227(H) 173(H)  BUN 8 - 23 mg/dL '11 15 11  ' Creatinine 0.44 - 1.00 mg/dL 0.94 1.09(H) 0.93  Sodium 135 - 145 mmol/L 140 140 141  Potassium 3.5 - 5.1 mmol/L 3.6 3.8 3.5  Chloride 98 - 111 mmol/L 108 109 107  CO2 22 - 32 mmol/L 20(L) 19(L) 21(L)  Calcium 8.9 - 10.3 mg/dL 9.6 9.7 9.5  Total Protein 6.5 - 8.1 g/dL 5.8(L) 6.2(L) 6.0(L)  Total Bilirubin 0.3 - 1.2 mg/dL 0.4 0.5 0.8  Alkaline Phos 38 - 126 U/L 82 86 81  AST 15 - 41 U/L 9(L) 10(L) 14(L)  ALT 0 - 44 U/L '10 14 11   ' 09/18/2019 BM Bx Report ([id number])   09/18/2019 FISH Panel    05/30/2019 BM Bx   01/06/2019 BM Bx:     01/06/19 Cytogenetics:      05/30/19 BM Biopsy:   09/18/2019 FISH Panel    09/18/2019 BM Surgical Pathology ([id number])     RADIOGRAPHIC STUDIES: I have personally reviewed the radiological images as listed and agreed with the findings in the report. NM PET Image Restage (PS) Whole Body  Result Date: 04/25/2021 CLINICAL DATA:  Subsequent treatment strategy for myeloma. EXAM: NUCLEAR MEDICINE PET WHOLE BODY TECHNIQUE: 7.8 mCi F-18 FDG was injected intravenously. Full-ring PET imaging was performed from the head to foot after the radiotracer. CT  data was obtained and used for attenuation correction and anatomic localization. Fasting blood glucose: 146 mg/dl COMPARISON:  08/27/2020 FINDINGS: Mediastinal blood pool activity: SUV max 2.5 HEAD/NECK: Stable asymmetric hypermetabolism in the left parapharyngeal space. Incidental CT findings: none CHEST: No hypermetabolic mediastinal or hilar nodes. No suspicious pulmonary nodules on the CT scan. Incidental CT findings: Coronary artery calcification is evident. Atherosclerotic calcification is noted in the wall of the thoracic aorta. Right Port-A-Cath tip is positioned in the mid to distal SVC. ABDOMEN/PELVIS: No abnormal hypermetabolic activity within the liver, pancreas, adrenal glands, or spleen. No hypermetabolic lymph nodes in the abdomen or pelvis. Incidental CT findings: none SKELETON: As before, FDG accumulation is identified in multiple bilateral nonacute rib fractures. Uptake in the proximal left humerus is increased and associated with a sclerotic lesion (axial 82/4), demonstrating SUV max =  5.2. Multiple lucent lesions are identified in the spine without associated hypermetabolism. Stable. Incidental CT findings: none EXTREMITIES: No abnormal hypermetabolic activity in the lower extremities. Incidental CT findings: none IMPRESSION: 1. Interval progression of hypermetabolism associated with the sclerotic lesion in the proximal left humerus. Site of active myeloma not excluded. Consider x-rays of the left humerus to further evaluate. 2. Otherwise similar exam with uptake identified in multiple nonacute bilateral rib fractures compatible with healing. 3. Stable focal hypermetabolism in the left parapharyngeal/deep parotid region. Electronically Signed   By: Misty Stanley M.D.   On: 04/25/2021 10:37   CT Biopsy  Result Date: 04/28/2021 INDICATION: MULTIPLE MYELOMA EXAM: CT GUIDED RIGHT ILIAC BONE MARROW ASPIRATION AND CORE BIOPSY Date:  04/28/2021 04/28/2021 11:42 am Radiologist:  M. Daryll Brod, MD  Guidance:  CT FLUOROSCOPY TIME:  Fluoroscopy Time: None. MEDICATIONS: 1% lidocaine local ANESTHESIA/SEDATION: 2.0 mg IV Versed; 50 mcg IV Fentanyl Moderate Sedation Time:  10 minutes The patient was continuously monitored during the procedure by the interventional radiology nurse under my direct supervision. CONTRAST:  None. COMPLICATIONS: None PROCEDURE: Informed consent was obtained from the patient following explanation of the procedure, risks, benefits and alternatives. The patient understands, agrees and consents for the procedure. All questions were addressed. A time out was performed. The patient was positioned prone and non-contrast localization CT was performed of the pelvis to demonstrate the iliac marrow spaces. Maximal barrier sterile technique utilized including caps, mask, sterile gowns, sterile gloves, large sterile drape, hand hygiene, and Betadine prep. Under sterile conditions and local anesthesia, an 11 gauge coaxial bone biopsy needle was advanced into the right iliac marrow space. Needle position was confirmed with CT imaging. Initially, bone marrow aspiration was performed. Next, the 11 gauge outer cannula was utilized to obtain a right iliac bone marrow core biopsy. Needle was removed. Hemostasis was obtained with compression. The patient tolerated the procedure well. Samples were prepared with the cytotechnologist. No immediate complications. IMPRESSION: CT guided right iliac bone marrow aspiration and core biopsy. Electronically Signed   By: Jerilynn Mages.  Shick M.D.   On: 04/28/2021 11:55   CT BONE MARROW BIOPSY & ASPIRATION  Result Date: 04/28/2021 INDICATION: MULTIPLE MYELOMA EXAM: CT GUIDED RIGHT ILIAC BONE MARROW ASPIRATION AND CORE BIOPSY Date:  04/28/2021 04/28/2021 11:42 am Radiologist:  M. Daryll Brod, MD Guidance:  CT FLUOROSCOPY TIME:  Fluoroscopy Time: None. MEDICATIONS: 1% lidocaine local ANESTHESIA/SEDATION: 2.0 mg IV Versed; 50 mcg IV Fentanyl Moderate Sedation Time:  10 minutes The  patient was continuously monitored during the procedure by the interventional radiology nurse under my direct supervision. CONTRAST:  None. COMPLICATIONS: None PROCEDURE: Informed consent was obtained from the patient following explanation of the procedure, risks, benefits and alternatives. The patient understands, agrees and consents for the procedure. All questions were addressed. A time out was performed. The patient was positioned prone and non-contrast localization CT was performed of the pelvis to demonstrate the iliac marrow spaces. Maximal barrier sterile technique utilized including caps, mask, sterile gowns, sterile gloves, large sterile drape, hand hygiene, and Betadine prep. Under sterile conditions and local anesthesia, an 11 gauge coaxial bone biopsy needle was advanced into the right iliac marrow space. Needle position was confirmed with CT imaging. Initially, bone marrow aspiration was performed. Next, the 11 gauge outer cannula was utilized to obtain a right iliac bone marrow core biopsy. Needle was removed. Hemostasis was obtained with compression. The patient tolerated the procedure well. Samples were prepared with the cytotechnologist. No immediate complications. IMPRESSION: CT  guided right iliac bone marrow aspiration and core biopsy. Electronically Signed   By: Jerilynn Mages.  Shick M.D.   On: 04/28/2021 11:55    ASSESSMENT & PLAN:   80 y.o. female with  1. Recently diagnosed Multiple Myeloma, RISS Stage III  Labs upon initial presentation from 12/08/18, blood counts are normal including WBC at 7.1k, HGB at 13.1, and PLT at 245k. Calcium normal at 10.3. Creatinine normal at 0.63. M spike at 0.5g. 12/13/18 Bone Scan revealed Multifocal uptake throughout the skeleton, consistent with diffuse metastatic disease. Primary tumor is not specified. 2. Uptake in the proximal right femur, consistent with lytic lesions. 3. Uptake in the ribs bilaterally as described. 4. Lesions in the proximal left humerus.  5. Diffuse uptake throughout the skull consistent with metastatic disease. 6. Right paramedian uptake at the manubrium.  12/13/18 CT Right Femur revealed Numerous lytic lesions involving the right femur and a lytic lesion in the left inferior pubic ramus. Overall appearance is most concerning for multiple myeloma  12/27/18 Pretreatment 24hour UPEP observed an M spike at 89m, and showed 1975mtotal protein/day.  12/27/18 Pretreatment MMP revealed M Protein at 0.5g with IgG Lambda specificity. Kappa:Lambda light chain ratio at 0.13, with Lambda at 40.3. There is less abnormal protein and light chains than I would expect from 30% plasma cells, which suggests hypo-secretory or non-secretory neoplastic plasma cells. Will have an impact in assessing response. 01/05/19 PET/CT revealed Innumerable lytic lesions in the skeleton compatible with myeloma. Most of the larger lesions are hypermetabolic, for example including a left proximal humeral shaft lesion with maximum SUV of 8.1 and a 2.8 cm lesion in the left T9 vertebral body with maximum SUV 5.1. Most of the smaller lytic lesions, and some of the larger lesions, do not demonstrate accentuated metabolic activity. 2. 1.2 cm in short axis lymph node in the left parapharyngeal space is hypermetabolic with maximum SUV 11.8. I do not see a separate mass in the head and neck to give rise to this hypermetabolic lymph node. 3. Mosaic attenuation in the lower lobes, nonspecific possibly from air trapping. 4.  Aortic Atherosclerosis 5. Heterogeneous activity in the liver, making it hard to exclude small liver lesions. Consider hepatic protocol MRI with and without contrast for definitive assessment. Nonobstructive right nephrolithiasis. Old granulomatous disease  01/06/19 Bone Marrow biopsy revealed interstitial increase in plasma cells (28% aspirate, 40% CD138 immunohistochemistry). Plasma cells negative for light chains consistent with a non or weakly secretory myeloma    01/06/19 Cytogenetics revealed 37% of cells with trisomy 11 or 11q deletion, and 40.5% of cells with 17p mutation  S/p 5 cycles of KRD treatment  05/31/19 BM Biopsy revealed mild atypical plasmacytosis at 5% with polytypic variation.   06/01/19 PET/CT revealed "Dominant lesion in the LEFT humerus is decreased significantly in metabolic activity. Additional hypermetabolic skeletal lytic lesions have decreased in metabolic activity or similar to comparison exam (01/05/2019). No evidence of disease progression. 2. Multiple additional lytic lesions do not have metabolic activity and unchanged. 3. No new skeletal lesions are identified. No soft tissue plasmacytoma identified. 4. Nodule / node in the LEFT parapharyngeal space which is intensely hypermetabolic not changed from prior. 5. New hypermetabolic LEFT lower lobe pulmonary nodule is indeterminate. Recommend close attention on follow-up 6. New obstructive hydronephrosis of the RIGHT kidney related to RIGHT UPJ stone."  09/18/2019 BM Bx Report which revealed "Slightly hypercellular bone marrow for age with trilineage hematopoiesis and 1% plasma cells."  09/14/2019 PET/CT Whole Body Scan ([id number]  which revealed "1. There widespread tiny lytic lesions compatible with multiple myeloma. Index larger lesions are generally similar to the prior exam, with low-grade activity such as the left T9 vertebral body lesion with maximum SUV 4.5. Is mild increase in the activity associated with a mildly sclerotic left proximal humeral lesion, maximum SUV 4.8 (previously 3.5). 2. At the site of the prior left lower lobe nodule is currently more bandlike thickening, with maximum SUV only 1.9, probably benign, continued surveillance of this region suggested. 3. There several small but hypermetabolic lymph nodes. This includes a left parapharyngeal space node measuring 1.0 cm with maximum SUV 12.3 (stable); a left level IB lymph node measuring 0.5 cm with maximum SUV 4.8  (slightly larger than prior); and a left inguinal lymph node measuring 0.7 cm in short axis with maximum SUV 6.4 (previously 0.5 cm with maximum SUV 0.6). Significance of these lymph nodes uncertain, surveillance is recommended. 4. New 5 mm left lower lobe subpleural nodule on image 32/8, not appreciably hypermetabolic, surveillance suggested. 5. Focal subcutaneous stranding along the left perineum measuring about 2.6 by 1.1 cm on image 221/4, maximum SUV 12.5. This was not present previously and is most likely inflammatory, although given the notable SUV, surveillance of this region is suggested. 6. Other imaging findings of potential clinical significance: Aortic Atherosclerosis (ICD10-I70.0). Coronary atherosclerosis. Old granulomatous disease. Mild right hydronephrosis due to a 7 mm right UPJ calculus. 2 mm right kidney upper pole nonobstructive renal calculus. Prominent stool throughout the colon favors constipation."  12/19/2019 Thoracic & Lumbar Spine MRI ([id number]) ([id number]) revealed "Suspected myeloma lesions at T9 and S1. No compression deformity or epidural disease.  01/18/2020 PET/CT ([id number]) which revealed "1. Stable lytic lesions throughout the skeleton. The larger lytic lesions which had mild metabolic activity on comparison exam now have background metabolic activity. No evidence of active myeloma. No evidence of progression multiple myeloma.  No plasmacytoma 3. Hypermetabolic nodules in the LEFT neck may be associated deep tissues of the LEFT parotid gland. 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  01/17/19 MRI Liver revealed Several appreciable liver lesions all have benign imaging characteristics. No MRI findings of metastatic involvement of the liver. 2. Scattered bony lesions  corresponding to the lytic lesions seen at PET-CT, compatible with active myeloma. 3. Aortic Atherosclerosis.  Mild cardiomegaly. 4. Diffuse hepatic steatosis.   3. Left lower lobe pulmonary nodule First seen on 06/01/19 PET/CT PET/CT 2/33/0076: No hypermetabolic mediastinal or hilar nodes. No suspicious pulmonary nodules on the CT scan.  4. 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 Being managed conservatively as per patient's preference.  No symptoms from this currently.  PLAN: -Discussed pt labwork yesterday, 04/28/2021; counts lower (Plt) due to this being her week off and not bounced back yet. -Discussed pt PET Whole Body ([id number]) on 04/24/2021; mostly stable with one spot lighting up more in proximal left humerus.  -Advised pt we will continue to monitor labs to evaluate if we need to decrease the dosage of Revlimid. -Advised pt that if the bone marrow shows she is in remission, then we will refer her to radiation for the one spot in the humerus that is brighter. -Advised pt that she will not be receiving treatment today. She is scheduled to start C28D1 on 05/23. -  Discussed Evusheld and pt's eligibility. Will send referral. Advised pt to wait around one month after this before getting the second booster shot. -Recommended pt receive the second COVID booster shot as recently approved. Advised pt to wait 4-6 months following first booster shot before getting this. -The pt has no prohibitive toxicities from continuing Carfilzomib & Revlimid at this time. -Will continue Carflizomib at 56 mg/m^2 q2weeks for maintenance. -Continue 15 mg Revlimid 3 weeks on, 1 week off for maintenance. No prohibitive toxicities at this time. -Continue 2.5 Eliquis BID for VTE prophylaxis -Continue Potassium daily.  -Continue Zometa q4weeks -Will see back in 2 months with labs.   FOLLOW UP: Please schedule next 3  cycles [6 doses] of carfilzomib with port flush and labs. MD visit in 2 months Ambulatory referral for Evusheld Referral to radiation oncology for consideration of localized radiation to left humerus myeloma lesion   All of the patient's questions were answered with apparent satisfaction. The patient knows to call the clinic with any problems, questions or concerns.  The total time spent in the appointment was 30 minutes and more than 50% was on counseling and direct patient cares.    Sullivan Lone MD [place] AAHIVMS [facility] Hematology/Oncology Physician [facility]  (Office):       [phone] (Work cell):  [phone] (Fax):           [phone]  04/29/2021 10:33 AM  I, Reinaldo Raddle, am acting as scribe for Dr. Sullivan Lone, MD.   .I have reviewed the above documentation for accuracy and completeness, and I agree with the above. Brunetta Genera MD   ADDENDUM  BMBx5/16/2022:  DIAGNOSIS:   BONE MARROW, ASPIRATE, CLOT, CORE:  -Hypercellular bone marrow for age with trilineage hematopoiesis and 2%  plasma cells  -See comment   PERIPHERAL BLOOD:  -Red blood cells with macrocytosis  -Leukopenia  -Thrombocytopenia   COMMENT:   The bone marrow is hypercellular for age with trilineage hematopoiesis  but with relative abundance of erythroid precursors. There are mild  dyspoietic changes present that are likely related to therapy. In this  background, the plasma cells represent 2% of all cells with lack of  large aggregates of sheets and display a polyclonal staining pattern for  kappa and lambda light chains. Hence there is no definitive or  diagnostic evidence of residual plasma cell neoplasm. Correlation with  cytogenetic and FISH studies is recommended.

## 2021-04-29 ENCOUNTER — Inpatient Hospital Stay: Payer: Medicare PPO | Admitting: Hematology

## 2021-04-29 VITALS — BP 124/65 | HR 78 | Temp 97.2°F | Resp 18 | Wt 154.2 lb

## 2021-04-29 DIAGNOSIS — Z79899 Other long term (current) drug therapy: Secondary | ICD-10-CM | POA: Diagnosis not present

## 2021-04-29 DIAGNOSIS — C9000 Multiple myeloma not having achieved remission: Secondary | ICD-10-CM | POA: Diagnosis not present

## 2021-04-29 DIAGNOSIS — Z7901 Long term (current) use of anticoagulants: Secondary | ICD-10-CM | POA: Diagnosis not present

## 2021-04-29 DIAGNOSIS — Z5112 Encounter for antineoplastic immunotherapy: Secondary | ICD-10-CM | POA: Diagnosis not present

## 2021-04-29 DIAGNOSIS — Z780 Asymptomatic menopausal state: Secondary | ICD-10-CM | POA: Diagnosis not present

## 2021-04-29 DIAGNOSIS — R911 Solitary pulmonary nodule: Secondary | ICD-10-CM | POA: Diagnosis not present

## 2021-04-29 DIAGNOSIS — Z8052 Family history of malignant neoplasm of bladder: Secondary | ICD-10-CM | POA: Diagnosis not present

## 2021-04-30 ENCOUNTER — Other Ambulatory Visit: Payer: Self-pay | Admitting: Hematology

## 2021-04-30 ENCOUNTER — Other Ambulatory Visit: Payer: Self-pay | Admitting: *Deleted

## 2021-04-30 ENCOUNTER — Encounter: Payer: Self-pay | Admitting: Hematology

## 2021-04-30 ENCOUNTER — Encounter: Payer: Self-pay | Admitting: Internal Medicine

## 2021-04-30 DIAGNOSIS — C9000 Multiple myeloma not having achieved remission: Secondary | ICD-10-CM

## 2021-04-30 DIAGNOSIS — E876 Hypokalemia: Secondary | ICD-10-CM

## 2021-04-30 DIAGNOSIS — Z7189 Other specified counseling: Secondary | ICD-10-CM

## 2021-04-30 LAB — SURGICAL PATHOLOGY

## 2021-04-30 MED ORDER — ACYCLOVIR 400 MG PO TABS: ORAL_TABLET | ORAL | 5 refills | Status: DC

## 2021-04-30 MED ORDER — POTASSIUM CHLORIDE CRYS ER 20 MEQ PO TBCR: EXTENDED_RELEASE_TABLET | ORAL | 0 refills | Status: DC

## 2021-05-01 ENCOUNTER — Telehealth: Payer: Self-pay | Admitting: Hematology

## 2021-05-01 MED ORDER — FENTANYL 12 MCG/HR TD PT72: 1.0000 | MEDICATED_PATCH | TRANSDERMAL | 0 refills | Status: DC

## 2021-05-01 NOTE — Telephone Encounter (Signed)
Called pt this morning per 5/18 sch msg. Pt wanted to schedule next appts. We do have to send an add on to schedule pt's next infusion appt. When I called pt, there was no answer so I left a vm explaining that we are working on her appts and we will give her a call back as soon as we have everything scheduled.

## 2021-05-01 NOTE — Telephone Encounter (Signed)
  Patient has not taken simvastatin (ZOCOR) 20 MG tablet in 3 days, can short supply be sent to pharmacy Appointment 5/23

## 2021-05-02 ENCOUNTER — Other Ambulatory Visit: Payer: Self-pay

## 2021-05-02 MED ORDER — SIMVASTATIN 20 MG PO TABS: ORAL_TABLET | ORAL | 0 refills | Status: DC

## 2021-05-04 ENCOUNTER — Encounter: Payer: Self-pay | Admitting: Hematology

## 2021-05-05 ENCOUNTER — Telehealth (INDEPENDENT_AMBULATORY_CARE_PROVIDER_SITE_OTHER): Payer: Medicare PPO | Admitting: Internal Medicine

## 2021-05-05 ENCOUNTER — Encounter: Payer: Self-pay | Admitting: Hematology

## 2021-05-05 ENCOUNTER — Encounter: Payer: Self-pay | Admitting: Internal Medicine

## 2021-05-05 DIAGNOSIS — J4520 Mild intermittent asthma, uncomplicated: Secondary | ICD-10-CM | POA: Diagnosis not present

## 2021-05-05 DIAGNOSIS — E1169 Type 2 diabetes mellitus with other specified complication: Secondary | ICD-10-CM

## 2021-05-05 DIAGNOSIS — F320 Major depressive disorder, single episode, mild: Secondary | ICD-10-CM

## 2021-05-05 DIAGNOSIS — C9000 Multiple myeloma not having achieved remission: Secondary | ICD-10-CM | POA: Diagnosis not present

## 2021-05-05 DIAGNOSIS — E118 Type 2 diabetes mellitus with unspecified complications: Secondary | ICD-10-CM

## 2021-05-05 DIAGNOSIS — E785 Hyperlipidemia, unspecified: Secondary | ICD-10-CM

## 2021-05-05 MED ORDER — SIMVASTATIN 20 MG PO TABS: ORAL_TABLET | ORAL | 3 refills | Status: DC

## 2021-05-05 MED ORDER — SERTRALINE HCL 50 MG PO TABS: 50.0000 mg | ORAL_TABLET | Freq: Every day | ORAL | 3 refills | Status: DC

## 2021-05-05 NOTE — Progress Notes (Signed)
Virtual Visit via Video Note  I connected with Faith Orr on 05/05/21 at 10:40 AM EDT by a video enabled telemedicine application and verified that I am speaking with the correct person using two identifiers.  The patient and the provider were at separate locations throughout the entire encounter. Patient location: home, Provider location: work   I discussed the limitations of evaluation and management by telemedicine and the availability of in person appointments. The patient expressed understanding and agreed to proceed. The patient and the provider were the only parties present for the visit unless noted in HPI below.  History of Present Illness: The patient is a 80 y.o. female with visit for follow up medical conditions including diabetes and cholesterol and multiple myeloma. Has been taking metformin only for diabetes not glipizide  Observations/Objective: Appearance: normal, breathing appears normal, casual grooming, abdomen does not appear distended, appears in pain, mental status is A and O times 3  Assessment and Plan: See problem oriented charting  Follow Up Instructions: see A/P  I discussed the assessment and treatment plan with the patient. The patient was provided an opportunity to ask questions and all were answered. The patient agreed with the plan and demonstrated an understanding of the instructions.   The patient was advised to call back or seek an in-person evaluation if the symptoms worsen or if the condition fails to improve as anticipated.  Hoyt Koch, MD

## 2021-05-06 ENCOUNTER — Telehealth: Payer: Self-pay | Admitting: Hematology

## 2021-05-06 ENCOUNTER — Encounter (HOSPITAL_COMMUNITY): Payer: Self-pay | Admitting: Hematology

## 2021-05-06 NOTE — Telephone Encounter (Signed)
Scheduled per 05/17 los, patient has been called and notified.  

## 2021-05-07 NOTE — Assessment & Plan Note (Signed)
Needs lipid panel and will ask Kale her oncologist to order and forward with next labs.

## 2021-05-07 NOTE — Assessment & Plan Note (Signed)
Controlled without inhalers currently. No flare.

## 2021-05-07 NOTE — Assessment & Plan Note (Signed)
Needs HgA1c as none recently. Will ask her oncologist to order and forward results with next lab draw.

## 2021-05-07 NOTE — Assessment & Plan Note (Signed)
Doing well with sertraline and will maintain dosing. She denies recurrent symptoms.

## 2021-05-07 NOTE — Assessment & Plan Note (Signed)
Seeing oncology and stable, she is having some difficulty with scheduling her infusions and has been delayed in this due to the cancer center.

## 2021-05-07 NOTE — Progress Notes (Signed)
Histology and Location of Primary Cancer: Multiple Myeloma   Patient states she does not notice anything with her left arm, she is able to use it like normal.  Sites of Visceral and Bony Metastatic Disease: Left Humerus  PET 04/24/2021: Interval progression of hypermetabolism associated with the sclerotic lesion in the proximal left humerus. Site of active myeloma not excluded. Consider x-rays of the left humerus to further evaluate. Otherwise similar exam with uptake identified in multiple nonacute bilateral rib fractures compatible with healing.  MRI Sacrum 01/15/2021: Similar appearing small myeloma lesion in the S1 spinous process.  No new lesion.  Moderate L4-L5 facet arthropathy with active degenerative inflammatory changes, which can be a source of lower back pain.  MRI L Spine 01/15/2021: No pathologic fracture or focal bone marrow lesion of the lumbar spine. Please see dedicated report for MRI sacrum for discussion of the sacrum.  Unchanged examination of the lumbar spine with moderate right L5-S1 neural foraminal stenosis and mild bilateral L4-L5 neural foraminal stenosis.  Past/Anticipated chemotherapy by medical oncology, if any:  Dr. Irene Limbo 04/29/2021 -Discussed pt PET Whole Body ([id number]) on 04/24/2021; mostly stable with one spot lighting up more in proximal left humerus.  -Advised pt we will continue to monitor labs to evaluate if we need to decrease the dosage of Revlimid. -Advised pt that if the bone marrow shows she is in remission, then we will refer her to radiation for the one spot in the humerus that is brighter. -Follow-up 07/03/2021   Pain on a scale of 0-10 is: Pain up and down her back, knee joints, takes oxycodone and has fentanyl patch.     Ambulatory status? Walker? Wheelchair?: Uses cane and walker on occasion.  SAFETY ISSUES:  Prior radiation? No  Pacemaker/ICD? No  Possible current pregnancy? Postmenopausal  Is the patient on methotrexate? No  Current  Complaints / other details:

## 2021-05-08 ENCOUNTER — Encounter: Payer: Self-pay | Admitting: Radiation Oncology

## 2021-05-08 ENCOUNTER — Other Ambulatory Visit: Payer: Self-pay

## 2021-05-08 ENCOUNTER — Ambulatory Visit
Admission: RE | Admit: 2021-05-08 | Discharge: 2021-05-08 | Disposition: A | Discharge status: Home / Self Care | Payer: Medicare PPO | Source: Ambulatory Visit | Attending: Radiation Oncology | Admitting: Radiation Oncology

## 2021-05-08 VITALS — Ht 59.0 in | Wt 154.0 lb

## 2021-05-08 DIAGNOSIS — C9000 Multiple myeloma not having achieved remission: Secondary | ICD-10-CM | POA: Diagnosis not present

## 2021-05-08 NOTE — Progress Notes (Signed)
Radiation Oncology         [phone] ________________________________  Initial Outpatient Consultation - Conducted via telephone due to current COVID-19 concerns for limiting patient exposure  I spoke with the patient to conduct this consult visit via telephone to spare the patient unnecessary potential exposure in the healthcare setting during the current COVID-19 pandemic. The patient was notified in advance and was offered a [place] meeting to allow for face to face communication but unfortunately reported that they did not have the appropriate resources/technology to support such a visit and instead preferred to proceed with a telephone consult.   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   HISTORY OF PRESENT ILLNESS: Faith Orr is a 80 y.o. female seen at the request of Dr. Irene Limbo for a diagnosis of myeloma with disease in the left humerus. The patient was originally diagnosed in late December 2019-January 2020 with multimodality work up. She received systemic chemotherapy and has remained on carfilzomib and Revlimid maintenance therapy. Recent PET imaging on 04/24/21 showed interval progressive change in the sclerotic lesion in the left proximal humerus. She's contacted by phone today to discuss the rationale for palliative radiotherapy. She did have a bone marrow biopsy on 04/28/21 and her pathology shows 2% plasma cells. She is awaiting confirmation of Dr. Grier Mitts impression of this result as it relates to her systemic therapy and overall disease status.     PREVIOUS RADIATION THERAPY: No   PAST MEDICAL HISTORY:  Past Medical History:  Diagnosis Date  . Allergy    seasonal  . Asthma   . DEPRESSION   . DIABETES MELLITUS, TYPE II    . Diverticulosis   . HYPERLIPIDEMIA   . Macular degeneration of left eye    mild, Dr.Hecker  . Obesity, unspecified   . Osteoarthritis of both knees   . OSTEOPENIA   . Osteopenia   . URINARY INCONTINENCE        PAST SURGICAL HISTORY: Past Surgical History:  Procedure Laterality Date  . CATARACT EXTRACTION Left 05/24/2018  . CESAREAN SECTION  01/1973  . CYSTOSCOPY/URETEROSCOPY/HOLMIUM LASER/STENT PLACEMENT Right 09/20/2019   Procedure: CYSTOSCOPY/URETEROSCOPY/HOLMIUM LASER/STENT PLACEMENT;  Surgeon: Lucas Mallow, MD;  Location: [facility];  Service: Urology;  Laterality: Right;  . FRACTURE SURGERY    . IR IMAGING GUIDED PORT INSERTION  02/20/2019  . left wrist surgery  2008   By Dr. Latanya Maudlin  . right ankle  1994     FAMILY HISTORY:  Family History  Problem Relation Age of Onset  . Diabetes Father   . Hyperlipidemia Father   . Heart disease Father   . Cancer Father   . Hypertension Father   . Colon cancer Paternal Grandmother 63  . Osteoporosis Mother   . Protein S deficiency Mother   . Hyperlipidemia Mother   . Multiple sclerosis Daughter   . Cancer Other        bladder  . Breast cancer Neg Hx      SOCIAL HISTORY:  reports that she has never smoked. She has never used smokeless tobacco. She reports that she does not drink alcohol and does not use drugs. The patient is married and her wife Faith Orr is in attendance  of the call. 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    ALLERGIES: Penicillins, Aleve [naproxen sodium], and Sulfonamide derivatives   MEDICATIONS:  Current Outpatient Medications  Medication Sig Dispense Refill  . acyclovir (ZOVIRAX) 400 MG tablet TAKE 1 TABLET(400 MG) BY MOUTH TWICE DAILY 60 tablet 5  . Blood Glucose Monitoring Suppl (FREESTYLE FREEDOM LITE) W/DEVICE KIT Use to check blood  sugars twice a day Dx 250.00 1 each 0  . Calcium Carbonate-Vitamin D 600-400 MG-UNIT tablet Take 1 tablet by mouth 2 (two) times daily.    . Cetirizine HCl 10 MG CAPS Take 1 capsule (10 mg total) by mouth daily. 30 capsule 1  . clindamycin (CLEOCIN) 300 MG capsule Take 2 capsules (600 mg total) by mouth once as needed for up to 1 dose (1 hour prior to dental procedures). 2 capsule 0  . dexamethasone (DECADRON) 4 MG tablet Take 5 tablets (20 mg total) by mouth once a week. On D22 of each cycle of treatment 20 tablet 5  . ELIQUIS 2.5 MG TABS tablet TAKE 1 TABLET(2.5 MG) BY MOUTH TWICE DAILY 60 tablet 2  . fentaNYL (DURAGESIC) 12 MCG/HR Place 1 patch onto the skin every 3 (three) days. 10 patch 0  . fluticasone (FLONASE) 50 MCG/ACT nasal spray Place 1 spray into both nostrils daily. 16 g 2  . glipiZIDE (GLUCOTROL XL) 5 MG 24 hr tablet TAKE 1 TABLET(5 MG) BY MOUTH DAILY WITH BREAKFAST 90 tablet 1  . glucose blood (FREESTYLE LITE) test strip CHECK BLOOD SUGAR TWICE DAILY AS DIRECTED Dx 250.00 180 each 3  . Lancets (FREESTYLE) lancets Use twice daily to check sugars. 100 each 11  . lenalidomide (REVLIMID) 15 MG capsule TAKE 1 CAPSULE BY MOUTH  DAILY FOR 21 DAYS, THEN 7  DAYS OFF 21 capsule 0  . lidocaine-prilocaine (EMLA) cream APPLY 1 APPLICATION TO THE AFFECTED AREA AS NEEDED. USE PRIOR TO PORT ACCESS 30 g 0  . metFORMIN (GLUCOPHAGE-XR) 500 MG 24 hr tablet TAKE 3 TABLETS(1500 MG) BY MOUTH DAILY WITH BREAKFAST 270 tablet 1  . Multiple Vitamins-Minerals (ICAPS) CAPS Take 1 capsule by mouth daily after breakfast.    . ondansetron (ZOFRAN) 8 MG tablet Take 1 tablet (8 mg total) by mouth 2 (two) times daily as needed (Nausea or vomiting). 30 tablet 1  . Oxycodone HCl 10 MG TABS Take 1 tablet (10 mg total) by mouth every 6 (six) hours as needed. 90 tablet 0  . pantoprazole (PROTONIX) 20 MG tablet TAKE 1 TABLET(20 MG) BY MOUTH DAILY 30 tablet 5  . polyethylene glycol (MIRALAX / GLYCOLAX) packet Take 17 g by  mouth daily after breakfast.    . potassium chloride SA (KLOR-CON) 20 MEQ tablet TAKE 1 TABLET(20 MEQ) BY MOUTH TWICE DAILY 180 tablet 0  . prochlorperazine (COMPAZINE) 10 MG tablet Take 1 tablet (10 mg total) by mouth every 6 (six) hours as needed (Nausea or vomiting). 30 tablet 1  . senna-docusate (SENNA S) 8.6-50 MG tablet Take 2 tablets by mouth at bedtime. 60 tablet 2  . sertraline (ZOLOFT) 50 MG tablet Take 1 tablet (50 mg total) by mouth daily. 90 tablet 3  . simvastatin (ZOCOR) 20 MG tablet TAKE 1 TABLET BY MOUTH EVERY DAY AT 6 PM 90 tablet 3  . Vitamin D, Ergocalciferol, (DRISDOL) 1.25 MG (50000 UNIT) CAPS capsule TAKE 1 CAPSULE BY MOUTH EVERY 7  DAYS 12 capsule 0  . Triamcinolone Acetonide 0.025 % LOTN Apply 1 application topically 3 (three) times daily as needed (rash/itching). (Patient not taking: Reported on 05/08/2021) 60 mL 2   No current facility-administered medications for this encounter.   Facility-Administered Medications Ordered in Other Encounters  Medication Dose Route Frequency Provider Last Rate Last Admin  . heparin lock flush 100 unit/mL  500 Units Intracatheter Once PRN Brunetta Genera, MD      . sodium chloride flush (NS) 0.9 % injection 10 mL  10 mL Intracatheter PRN Brunetta Genera, MD   10 mL at 10/02/19 1524     REVIEW OF SYSTEMS: On review of systems, the patient reports that she is doing well overall without complaints of pain in her left arm or shoulder. She does however take narcotic pain medication for pain in her back and knees, and hence it is difficult to discern new site of pain. At times due to her knee and hip pain she uses a cane, less frequently a walker. No other complaints are verbalized.      PHYSICAL EXAM:  Wt Readings from Last 3 Encounters:  05/08/21 154 lb (69.9 kg)  04/29/21 154 lb 3.2 oz (69.9 kg)  04/28/21 156 lb (70.8 kg)   Pain Assessment Pain Score: 0-No pain/10  Unable to otherwise assess due to encounter type.  ECOG  = 1  0 - Asymptomatic (Fully active, able to carry on all predisease activities without restriction)  1 - Symptomatic but completely ambulatory (Restricted in physically strenuous activity but ambulatory and able to carry out work of a light or sedentary nature. For example, light housework, office work)  2 - Symptomatic, <50% in bed during the day (Ambulatory and capable of all self care but unable to carry out any work activities. Up and about more than 50% of waking hours)  3 - Symptomatic, >50% in bed, but not bedbound (Capable of only limited self-care, confined to bed or chair 50% or more of waking hours)  4 - Bedbound (Completely disabled. Cannot carry on any self-care. Totally confined to bed or chair)  5 - Death   Faith Orr MM, Creech RH, Tormey DC, et al. [phone]). "Toxicity and response criteria of the [facility]". [place] Oncol. 5 (6): 649-55    LABORATORY DATA:  Lab Results  Component Value Date   WBC 3.6 (L) 04/28/2021   HGB 12.6 04/28/2021   HCT 38.4 04/28/2021   MCV 106.1 (H) 04/28/2021   PLT 55 (L) 04/28/2021   Lab Results  Component Value Date   NA 140 04/22/2021   K 3.6 04/22/2021   CL 108 04/22/2021   CO2 20 (L) 04/22/2021   Lab Results  Component Value Date   ALT 10 04/22/2021   AST 9 (L) 04/22/2021   ALKPHOS 82 04/22/2021   BILITOT 0.4 04/22/2021      RADIOGRAPHY: NM PET Image Restage (PS) Whole Body  Result Date: 04/25/2021 CLINICAL DATA:  Subsequent treatment strategy for myeloma. EXAM: NUCLEAR MEDICINE PET WHOLE BODY TECHNIQUE: 7.8 mCi F-18 FDG was injected intravenously. Full-ring PET imaging was performed from the head to foot after the radiotracer. CT data was obtained and used for attenuation correction and anatomic localization. Fasting blood glucose: 146 mg/dl COMPARISON:  08/27/2020 FINDINGS: Mediastinal blood pool activity: SUV max 2.5 HEAD/NECK: Stable asymmetric hypermetabolism in the left parapharyngeal space.  Incidental CT findings: none CHEST: No hypermetabolic mediastinal or hilar nodes. No suspicious pulmonary nodules on the  CT scan. Incidental CT findings: Coronary artery calcification is evident. Atherosclerotic calcification is noted in the wall of the thoracic aorta. Right Port-A-Cath tip is positioned in the mid to distal SVC. ABDOMEN/PELVIS: No abnormal hypermetabolic activity within the liver, pancreas, adrenal glands, or spleen. No hypermetabolic lymph nodes in the abdomen or pelvis. Incidental CT findings: none SKELETON: As before, FDG accumulation is identified in multiple bilateral nonacute rib fractures. Uptake in the proximal left humerus is increased and associated with a sclerotic lesion (axial 82/4), demonstrating SUV max = 5.2. Multiple lucent lesions are identified in the spine without associated hypermetabolism. Stable. Incidental CT findings: none EXTREMITIES: No abnormal hypermetabolic activity in the lower extremities. Incidental CT findings: none IMPRESSION: 1. Interval progression of hypermetabolism associated with the sclerotic lesion in the proximal left humerus. Site of active myeloma not excluded. Consider x-rays of the left humerus to further evaluate. 2. Otherwise similar exam with uptake identified in multiple nonacute bilateral rib fractures compatible with healing. 3. Stable focal hypermetabolism in the left parapharyngeal/deep parotid region. Electronically Signed   By: Misty Stanley M.D.   On: 04/25/2021 10:37   CT Biopsy  Result Date: 04/28/2021 INDICATION: MULTIPLE MYELOMA EXAM: CT GUIDED RIGHT ILIAC BONE MARROW ASPIRATION AND CORE BIOPSY Date:  04/28/2021 04/28/2021 11:42 am Radiologist:  M. Daryll Brod, MD Guidance:  CT FLUOROSCOPY TIME:  Fluoroscopy Time: None. MEDICATIONS: 1% lidocaine local ANESTHESIA/SEDATION: 2.0 mg IV Versed; 50 mcg IV Fentanyl Moderate Sedation Time:  10 minutes The patient was continuously monitored during the procedure by the interventional radiology  nurse under my direct supervision. CONTRAST:  None. COMPLICATIONS: None PROCEDURE: Informed consent was obtained from the patient following explanation of the procedure, risks, benefits and alternatives. The patient understands, agrees and consents for the procedure. All questions were addressed. A time out was performed. The patient was positioned prone and non-contrast localization CT was performed of the pelvis to demonstrate the iliac marrow spaces. Maximal barrier sterile technique utilized including caps, mask, sterile gowns, sterile gloves, large sterile drape, hand hygiene, and Betadine prep. Under sterile conditions and local anesthesia, an 11 gauge coaxial bone biopsy needle was advanced into the right iliac marrow space. Needle position was confirmed with CT imaging. Initially, bone marrow aspiration was performed. Next, the 11 gauge outer cannula was utilized to obtain a right iliac bone marrow core biopsy. Needle was removed. Hemostasis was obtained with compression. The patient tolerated the procedure well. Samples were prepared with the cytotechnologist. No immediate complications. IMPRESSION: CT guided right iliac bone marrow aspiration and core biopsy. Electronically Signed   By: Jerilynn Mages.  Shick M.D.   On: 04/28/2021 11:55   CT BONE MARROW BIOPSY & ASPIRATION  Result Date: 04/28/2021 INDICATION: MULTIPLE MYELOMA EXAM: CT GUIDED RIGHT ILIAC BONE MARROW ASPIRATION AND CORE BIOPSY Date:  04/28/2021 04/28/2021 11:42 am Radiologist:  M. Daryll Brod, MD Guidance:  CT FLUOROSCOPY TIME:  Fluoroscopy Time: None. MEDICATIONS: 1% lidocaine local ANESTHESIA/SEDATION: 2.0 mg IV Versed; 50 mcg IV Fentanyl Moderate Sedation Time:  10 minutes The patient was continuously monitored during the procedure by the interventional radiology nurse under my direct supervision. CONTRAST:  None. COMPLICATIONS: None PROCEDURE: Informed consent was obtained from the patient following explanation of the procedure, risks, benefits  and alternatives. The patient understands, agrees and consents for the procedure. All questions were addressed. A time out was performed. The patient was positioned prone and non-contrast localization CT was performed of the pelvis to demonstrate the iliac marrow spaces. Maximal barrier sterile technique utilized  including caps, mask, sterile gowns, sterile gloves, large sterile drape, hand hygiene, and Betadine prep. Under sterile conditions and local anesthesia, an 11 gauge coaxial bone biopsy needle was advanced into the right iliac marrow space. Needle position was confirmed with CT imaging. Initially, bone marrow aspiration was performed. Next, the 11 gauge outer cannula was utilized to obtain a right iliac bone marrow core biopsy. Needle was removed. Hemostasis was obtained with compression. The patient tolerated the procedure well. Samples were prepared with the cytotechnologist. No immediate complications. IMPRESSION: CT guided right iliac bone marrow aspiration and core biopsy. Electronically Signed   By: Jerilynn Mages.  Shick M.D.   On: 04/28/2021 11:55       IMPRESSION/PLAN: 1. 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 Dr. Lisbeth Renshaw discusses the patient's course including recent pathology findings and he reviews the nature of single sites of disease in locations that could be at risk of fracture or pain. Dr. Lisbeth Renshaw offers a course of palliative radiotherapy to her left humerus. We discussed the risks, benefits, short, and long term effects of radiotherapy, as well as the palliative intent, and the patient is interested in proceeding. Dr. Lisbeth Renshaw discusses the delivery and logistics of radiotherapy and anticipates a course of 2 weeks of radiotherapy. She will simulate on 05/14/21 at which time we will show her the images from her PET scan and sign written consent to proceed. We anticipate she will start treatment on 05/20/21. She will follow up with Dr. Irene Limbo as well for discussion of her bone  marrow biopsy results as she is scheduled to come in for her maintenance infusion tomorrow.     Given current concerns for patient exposure during the COVID-19 pandemic, this encounter was conducted via telephone.  The patient has provided two factor identification and has given verbal consent for this type of encounter and has been advised to only accept a meeting of this type in a secure network environment. The time spent during this encounter was 60 minutes including preparation, discussion, and coordination of the patient's care. The attendants for this meeting include Faith Nicely, RN, Dr. Lisbeth Renshaw, Faith Orr  and Faith Orr and her wife Faith Orr.  During the encounter, Faith Nicely, RN, Dr. Lisbeth Renshaw, and Faith Orr were located at [facility] Radiation Oncology Department.  Faith Orr was located at home with her wife Faith Orr.   The above documentation reflects my direct findings during this shared patient visit. Please see the separate note by Dr. Lisbeth Renshaw on this date for the remainder of the patient's plan of care.    Carola Rhine, [facility]   **Disclaimer: This note was dictated with voice recognition software. Similar sounding words can inadvertently be transcribed and this note may contain transcription errors which may not have been corrected upon publication of note.**

## 2021-05-09 ENCOUNTER — Other Ambulatory Visit: Payer: Self-pay

## 2021-05-09 ENCOUNTER — Inpatient Hospital Stay: Payer: Medicare PPO

## 2021-05-09 VITALS — BP 126/67 | HR 70 | Temp 98.4°F | Resp 16

## 2021-05-09 DIAGNOSIS — C9000 Multiple myeloma not having achieved remission: Secondary | ICD-10-CM

## 2021-05-09 DIAGNOSIS — R911 Solitary pulmonary nodule: Secondary | ICD-10-CM | POA: Diagnosis not present

## 2021-05-09 DIAGNOSIS — Z7189 Other specified counseling: Secondary | ICD-10-CM

## 2021-05-09 DIAGNOSIS — Z780 Asymptomatic menopausal state: Secondary | ICD-10-CM | POA: Diagnosis not present

## 2021-05-09 DIAGNOSIS — Z8052 Family history of malignant neoplasm of bladder: Secondary | ICD-10-CM | POA: Diagnosis not present

## 2021-05-09 DIAGNOSIS — Z79899 Other long term (current) drug therapy: Secondary | ICD-10-CM | POA: Diagnosis not present

## 2021-05-09 DIAGNOSIS — Z5112 Encounter for antineoplastic immunotherapy: Secondary | ICD-10-CM | POA: Diagnosis not present

## 2021-05-09 DIAGNOSIS — Z7901 Long term (current) use of anticoagulants: Secondary | ICD-10-CM | POA: Diagnosis not present

## 2021-05-09 DIAGNOSIS — Z95828 Presence of other vascular implants and grafts: Secondary | ICD-10-CM

## 2021-05-09 LAB — CMP (CANCER CENTER ONLY)
ALT: 9 U/L (ref 0–44)
AST: 11 U/L — ABNORMAL LOW (ref 15–41)
Albumin: 3.3 g/dL — ABNORMAL LOW (ref 3.5–5.0)
Alkaline Phosphatase: 81 U/L (ref 38–126)
Anion gap: 11 (ref 5–15)
BUN: 11 mg/dL (ref 8–23)
CO2: 22 mmol/L (ref 22–32)
Calcium: 9.7 mg/dL (ref 8.9–10.3)
Chloride: 107 mmol/L (ref 98–111)
Creatinine: 0.94 mg/dL (ref 0.44–1.00)
GFR, Estimated: >60 mL/min (ref >60)
Glucose, Bld: 213 mg/dL — ABNORMAL HIGH (ref 70–99)
Potassium: 4.1 mmol/L (ref 3.5–5.1)
Sodium: 140 mmol/L (ref 135–145)
Total Bilirubin: 0.6 mg/dL (ref 0.3–1.2)
Total Protein: 6 g/dL — ABNORMAL LOW (ref 6.5–8.1)

## 2021-05-09 LAB — CBC WITH DIFFERENTIAL (CANCER CENTER ONLY)
Abs Immature Granulocytes: 0.02 10*3/uL (ref 0.00–0.07)
Basophils Absolute: 0 10*3/uL (ref 0.0–0.1)
Basophils Relative: 1 %
Eosinophils Absolute: 0.1 10*3/uL (ref 0.0–0.5)
Eosinophils Relative: 3 %
HCT: 33.7 % — ABNORMAL LOW (ref 36.0–46.0)
Hemoglobin: 11.7 g/dL — ABNORMAL LOW (ref 12.0–15.0)
Immature Granulocytes: 1 %
Lymphocytes Relative: 24 %
Lymphs Abs: 0.7 10*3/uL (ref 0.7–4.0)
MCH: 35.6 pg — ABNORMAL HIGH (ref 26.0–34.0)
MCHC: 34.7 g/dL (ref 30.0–36.0)
MCV: 102.4 fL — ABNORMAL HIGH (ref 80.0–100.0)
Monocytes Absolute: 0.6 10*3/uL (ref 0.1–1.0)
Monocytes Relative: 20 %
Neutro Abs: 1.5 10*3/uL — ABNORMAL LOW (ref 1.7–7.7)
Neutrophils Relative %: 51 %
Platelet Count: 136 10*3/uL — ABNORMAL LOW (ref 150–400)
RBC: 3.29 MIL/uL — ABNORMAL LOW (ref 3.87–5.11)
RDW: 14.1 % (ref 11.5–15.5)
WBC Count: 3 10*3/uL — ABNORMAL LOW (ref 4.0–10.5)
nRBC: 0 % (ref 0.0–0.2)

## 2021-05-09 MED ORDER — FAMOTIDINE 20 MG PO TABS
20.0000 mg | ORAL_TABLET | Freq: Once | ORAL | Status: AC
  Administered 2021-05-09: 20 mg via ORAL

## 2021-05-09 MED ORDER — SODIUM CHLORIDE 0.9% FLUSH
10.0000 mL | INTRAVENOUS | Status: DC | PRN
  Administered 2021-05-09: 10 mL
  Filled 2021-05-09: qty 10

## 2021-05-09 MED ORDER — DIPHENHYDRAMINE HCL 25 MG PO TABS
25.0000 mg | ORAL_TABLET | Freq: Once | ORAL | Status: AC
  Administered 2021-05-09: 25 mg via ORAL
  Filled 2021-05-09: qty 1

## 2021-05-09 MED ORDER — DEXTROSE 5 % IV SOLN
56.0000 mg/m2 | Freq: Once | INTRAVENOUS | Status: AC
  Administered 2021-05-09: 100 mg via INTRAVENOUS
  Filled 2021-05-09: qty 30

## 2021-05-09 MED ORDER — PROCHLORPERAZINE MALEATE 10 MG PO TABS
10.0000 mg | ORAL_TABLET | Freq: Once | ORAL | Status: AC
  Administered 2021-05-09: 10 mg via ORAL

## 2021-05-09 MED ORDER — DIPHENHYDRAMINE HCL 25 MG PO CAPS
ORAL_CAPSULE | ORAL | Status: AC
  Filled 2021-05-09: qty 1

## 2021-05-09 MED ORDER — ACETAMINOPHEN 325 MG PO TABS
650.0000 mg | ORAL_TABLET | Freq: Once | ORAL | Status: AC
  Administered 2021-05-09: 650 mg via ORAL

## 2021-05-09 MED ORDER — FAMOTIDINE 20 MG PO TABS
ORAL_TABLET | ORAL | Status: AC
  Filled 2021-05-09: qty 1

## 2021-05-09 MED ORDER — DEXAMETHASONE 4 MG PO TABS
ORAL_TABLET | ORAL | Status: AC
  Filled 2021-05-09: qty 3

## 2021-05-09 MED ORDER — HEPARIN SOD (PORK) LOCK FLUSH 100 UNIT/ML IV SOLN
500.0000 [IU] | Freq: Once | INTRAVENOUS | Status: AC | PRN
  Administered 2021-05-09: 500 [IU]
  Filled 2021-05-09: qty 5

## 2021-05-09 MED ORDER — ACETAMINOPHEN 325 MG PO TABS
ORAL_TABLET | ORAL | Status: AC
  Filled 2021-05-09: qty 2

## 2021-05-09 MED ORDER — SODIUM CHLORIDE 0.9% FLUSH
10.0000 mL | Freq: Once | INTRAVENOUS | Status: AC
Start: 2021-05-09 — End: 2021-05-09
  Administered 2021-05-09: 10 mL
  Filled 2021-05-09: qty 10

## 2021-05-09 MED ORDER — DEXAMETHASONE 4 MG PO TABS
12.0000 mg | ORAL_TABLET | Freq: Once | ORAL | Status: AC
  Administered 2021-05-09: 12 mg via ORAL

## 2021-05-09 MED ORDER — SODIUM CHLORIDE 0.9 % IV SOLN
Freq: Once | INTRAVENOUS | Status: AC
  Filled 2021-05-09: qty 250

## 2021-05-09 MED ORDER — PROCHLORPERAZINE MALEATE 10 MG PO TABS
ORAL_TABLET | ORAL | Status: AC
  Filled 2021-05-09: qty 1

## 2021-05-09 NOTE — Patient Instructions (Signed)
[facility] MEDICAL [facility]COLOG[facility]  Discharge Instructions: Thank you for choosing [facility] to provide your oncology and hematology care.   If you have a lab appointment with the Cancer Center, please go directly to the Cancer Center and check in at the registration area.   Wear comfortable clothing and clothing appropriate for easy access to any Portacath or PICC line.   We strive to give you quality time with your provider. You may need to reschedule your appointment if you arrive late (15 or more minutes).  Arriving late affects you and other patients whose appointments are after yours.  Also, if you miss three or more appointments without notifying the office, you may be dismissed from the clinic at the provider's discretion.      For prescription refill requests, have your pharmacy contact our office and allow 72 hours for refills to be completed.    Today you received the following chemotherapy and/or immunotherapy agents carfilzomib   To help prevent nausea and vomiting after your treatment, we encourage you to take your nausea medication as directed.  BELOW ARE SYMPTOMS THAT SHOULD BE REPORTED IMMEDIATELY: . *FEVER GREATER THAN 100.4 F (38 C) OR HIGHER . *CHILLS OR SWEATING . *NAUSEA AND VOMITING THAT IS NOT CONTROLLED WITH YOUR NAUSEA MEDICATION . *UNUSUAL SHORTNESS OF BREATH . *UNUSUAL BRUISING OR BLEEDING . *URINARY PROBLEMS (pain or burning when urinating, or frequent urination) . *BOWEL PROBLEMS (unusual diarrhea, constipation, pain near the anus) . TENDERNESS IN MOUTH AND THROAT WITH OR WITHOUT PRESENCE OF ULCERS (sore throat, sores in mouth, or a toothache) . UNUSUAL RASH, SWELLING OR PAIN  . UNUSUAL VAGINAL DISCHARGE OR ITCHING   Items with * indicate a potential emergency and should be followed up as soon as possible or go to the Emergency Department if any problems should occur.  Please show the CHEMOTHERAPY ALERT CARD or IMMUNOTHERAPY ALERT  CARD at check-in to the Emergency Department and triage nurse.  Should you have questions after your visit or need to cancel or reschedule your appointment, please contact [facility]  Dept: [phone]  and follow the prompts.  Office hours are 8:00 a.m. to 4:30 p.m. Monday - Friday. Please note that voicemails left after 4:00 p.m. may not be returned until the following business day.  We are closed weekends and major holidays. You have access to a nurse at all times for urgent questions. Please call the main number to the clinic Dept: [phone] and follow the prompts.   For any non-urgent questions, you may also contact your provider using MyChart. We now offer e-Visits for anyone 18 and older to request care online for non-urgent symptoms. For details visit [email or web address].   Also download the MyChart app! Go to the app store, search "MyChart", open the app, select [place], and log in with your MyChart username and password.  Due to Covid, a mask is required upon entering the hospital/clinic. If you do not have a mask, one will be given to you upon arrival. For doctor visits, patients may have 1 support person aged 18 or older with them. For treatment visits, patients cannot have anyone with them due to current Covid guidelines and our immunocompromised population.   

## 2021-05-12 ENCOUNTER — Telehealth: Payer: Self-pay | Admitting: Adult Health

## 2021-05-12 NOTE — Telephone Encounter (Signed)
I called patient to discuss Evusheld, a long acting monoclonal antibody injection administered to patients with decreased immune systems or intolerance/allergy to the COVID 19 vaccine as COVID19 prevention.    Unable to reach patient.  LMOM to return my call.  Annalaya Wile, NP  

## 2021-05-13 ENCOUNTER — Encounter (HOSPITAL_COMMUNITY): Payer: Self-pay | Admitting: Hematology

## 2021-05-14 ENCOUNTER — Ambulatory Visit
Admission: RE | Admit: 2021-05-14 | Discharge: 2021-05-14 | Disposition: A | Discharge status: Home / Self Care | Payer: Medicare PPO | Source: Ambulatory Visit | Attending: Radiation Oncology | Admitting: Radiation Oncology

## 2021-05-14 ENCOUNTER — Other Ambulatory Visit: Payer: Self-pay

## 2021-05-14 DIAGNOSIS — Z510 Encounter for antineoplastic radiation therapy: Secondary | ICD-10-CM | POA: Diagnosis not present

## 2021-05-14 DIAGNOSIS — C9000 Multiple myeloma not having achieved remission: Secondary | ICD-10-CM | POA: Diagnosis not present

## 2021-05-20 ENCOUNTER — Other Ambulatory Visit: Payer: Self-pay | Admitting: Hematology

## 2021-05-20 DIAGNOSIS — C9000 Multiple myeloma not having achieved remission: Secondary | ICD-10-CM

## 2021-05-20 DIAGNOSIS — Z510 Encounter for antineoplastic radiation therapy: Secondary | ICD-10-CM | POA: Diagnosis not present

## 2021-05-22 ENCOUNTER — Encounter: Payer: Self-pay | Admitting: Hematology

## 2021-05-22 ENCOUNTER — Other Ambulatory Visit: Payer: Self-pay

## 2021-05-22 ENCOUNTER — Telehealth: Payer: Self-pay | Admitting: Hematology

## 2021-05-22 DIAGNOSIS — C9000 Multiple myeloma not having achieved remission: Secondary | ICD-10-CM

## 2021-05-22 NOTE — Telephone Encounter (Signed)
Left message with rescheduled upcoming appointment due to infusion capacity.

## 2021-05-23 ENCOUNTER — Encounter: Payer: Self-pay | Admitting: Hematology

## 2021-05-23 MED ORDER — OXYCODONE HCL 10 MG PO TABS: 10.0000 mg | ORAL_TABLET | Freq: Four times a day (QID) | ORAL | 0 refills | Status: DC | PRN

## 2021-05-26 ENCOUNTER — Ambulatory Visit
Admission: RE | Admit: 2021-05-26 | Discharge: 2021-05-26 | Disposition: A | Discharge status: Home / Self Care | Payer: Medicare PPO | Source: Ambulatory Visit | Attending: Radiation Oncology | Admitting: Radiation Oncology

## 2021-05-26 DIAGNOSIS — Z510 Encounter for antineoplastic radiation therapy: Secondary | ICD-10-CM | POA: Diagnosis not present

## 2021-05-26 DIAGNOSIS — C9000 Multiple myeloma not having achieved remission: Secondary | ICD-10-CM | POA: Diagnosis not present

## 2021-05-27 ENCOUNTER — Ambulatory Visit
Admission: RE | Admit: 2021-05-27 | Discharge: 2021-05-27 | Disposition: A | Discharge status: Home / Self Care | Payer: Medicare PPO | Source: Ambulatory Visit | Attending: Radiation Oncology | Admitting: Radiation Oncology

## 2021-05-27 DIAGNOSIS — Z510 Encounter for antineoplastic radiation therapy: Secondary | ICD-10-CM | POA: Diagnosis not present

## 2021-05-27 DIAGNOSIS — C9000 Multiple myeloma not having achieved remission: Secondary | ICD-10-CM | POA: Diagnosis not present

## 2021-05-28 ENCOUNTER — Ambulatory Visit
Admission: RE | Admit: 2021-05-28 | Discharge: 2021-05-28 | Disposition: A | Discharge status: Home / Self Care | Payer: Medicare PPO | Source: Ambulatory Visit | Attending: Radiation Oncology | Admitting: Radiation Oncology

## 2021-05-28 ENCOUNTER — Other Ambulatory Visit: Payer: Self-pay

## 2021-05-28 DIAGNOSIS — Z510 Encounter for antineoplastic radiation therapy: Secondary | ICD-10-CM | POA: Diagnosis not present

## 2021-05-28 DIAGNOSIS — C9000 Multiple myeloma not having achieved remission: Secondary | ICD-10-CM | POA: Diagnosis not present

## 2021-05-29 ENCOUNTER — Ambulatory Visit
Admission: RE | Admit: 2021-05-29 | Discharge: 2021-05-29 | Disposition: A | Discharge status: Home / Self Care | Payer: Medicare PPO | Source: Ambulatory Visit | Attending: Radiation Oncology | Admitting: Radiation Oncology

## 2021-05-29 DIAGNOSIS — C9000 Multiple myeloma not having achieved remission: Secondary | ICD-10-CM | POA: Diagnosis not present

## 2021-05-29 DIAGNOSIS — Z510 Encounter for antineoplastic radiation therapy: Secondary | ICD-10-CM | POA: Diagnosis not present

## 2021-05-30 ENCOUNTER — Ambulatory Visit
Admission: RE | Admit: 2021-05-30 | Discharge: 2021-05-30 | Disposition: A | Discharge status: Home / Self Care | Payer: Medicare PPO | Source: Ambulatory Visit | Attending: Radiation Oncology | Admitting: Radiation Oncology

## 2021-05-30 ENCOUNTER — Inpatient Hospital Stay: Payer: Medicare PPO | Attending: Hematology

## 2021-05-30 ENCOUNTER — Other Ambulatory Visit: Payer: Self-pay

## 2021-05-30 ENCOUNTER — Inpatient Hospital Stay: Payer: Medicare PPO

## 2021-05-30 VITALS — BP 153/67 | HR 66 | Temp 98.4°F | Resp 16 | Wt 156.0 lb

## 2021-05-30 DIAGNOSIS — Z7189 Other specified counseling: Secondary | ICD-10-CM

## 2021-05-30 DIAGNOSIS — C9000 Multiple myeloma not having achieved remission: Secondary | ICD-10-CM | POA: Insufficient documentation

## 2021-05-30 DIAGNOSIS — Z95828 Presence of other vascular implants and grafts: Secondary | ICD-10-CM

## 2021-05-30 DIAGNOSIS — Z79899 Other long term (current) drug therapy: Secondary | ICD-10-CM | POA: Diagnosis not present

## 2021-05-30 DIAGNOSIS — Z510 Encounter for antineoplastic radiation therapy: Secondary | ICD-10-CM | POA: Diagnosis not present

## 2021-05-30 LAB — CBC WITH DIFFERENTIAL/PLATELET
Abs Immature Granulocytes: 0.01 10*3/uL (ref 0.00–0.07)
Basophils Absolute: 0 10*3/uL (ref 0.0–0.1)
Basophils Relative: 1 %
Eosinophils Absolute: 0.1 10*3/uL (ref 0.0–0.5)
Eosinophils Relative: 2 %
HCT: 36.1 % (ref 36.0–46.0)
Hemoglobin: 12.4 g/dL (ref 12.0–15.0)
Immature Granulocytes: 0 %
Lymphocytes Relative: 34 %
Lymphs Abs: 1.2 10*3/uL (ref 0.7–4.0)
MCH: 35 pg — ABNORMAL HIGH (ref 26.0–34.0)
MCHC: 34.3 g/dL (ref 30.0–36.0)
MCV: 102 fL — ABNORMAL HIGH (ref 80.0–100.0)
Monocytes Absolute: 0.7 10*3/uL (ref 0.1–1.0)
Monocytes Relative: 19 %
Neutro Abs: 1.5 10*3/uL — ABNORMAL LOW (ref 1.7–7.7)
Neutrophils Relative %: 44 %
Platelets: 119 10*3/uL — ABNORMAL LOW (ref 150–400)
RBC: 3.54 MIL/uL — ABNORMAL LOW (ref 3.87–5.11)
RDW: 13.7 % (ref 11.5–15.5)
WBC: 3.5 10*3/uL — ABNORMAL LOW (ref 4.0–10.5)
nRBC: 0 % (ref 0.0–0.2)

## 2021-05-30 LAB — CMP (CANCER CENTER ONLY)
ALT: 16 U/L (ref 0–44)
AST: 19 U/L (ref 15–41)
Albumin: 3.5 g/dL (ref 3.5–5.0)
Alkaline Phosphatase: 77 U/L (ref 38–126)
Anion gap: 7 (ref 5–15)
BUN: 12 mg/dL (ref 8–23)
CO2: 22 mmol/L (ref 22–32)
Calcium: 8.8 mg/dL — ABNORMAL LOW (ref 8.9–10.3)
Chloride: 109 mmol/L (ref 98–111)
Creatinine: 0.93 mg/dL (ref 0.44–1.00)
GFR, Estimated: >60 mL/min (ref >60)
Glucose, Bld: 247 mg/dL — ABNORMAL HIGH (ref 70–99)
Potassium: 3.3 mmol/L — ABNORMAL LOW (ref 3.5–5.1)
Sodium: 138 mmol/L (ref 135–145)
Total Bilirubin: 0.6 mg/dL (ref 0.3–1.2)
Total Protein: 6.1 g/dL — ABNORMAL LOW (ref 6.5–8.1)

## 2021-05-30 MED ORDER — DIPHENHYDRAMINE HCL 25 MG PO CAPS
ORAL_CAPSULE | ORAL | Status: AC
  Filled 2021-05-30: qty 1

## 2021-05-30 MED ORDER — DIPHENHYDRAMINE HCL 25 MG PO TABS
25.0000 mg | ORAL_TABLET | Freq: Once | ORAL | Status: AC
  Administered 2021-05-30: 25 mg via ORAL
  Filled 2021-05-30: qty 1

## 2021-05-30 MED ORDER — PROCHLORPERAZINE MALEATE 10 MG PO TABS
10.0000 mg | ORAL_TABLET | Freq: Once | ORAL | Status: AC
Start: 2021-05-30 — End: 2021-05-30
  Administered 2021-05-30: 10 mg via ORAL

## 2021-05-30 MED ORDER — ZOLEDRONIC ACID 4 MG/100ML IV SOLN
INTRAVENOUS | Status: AC
  Filled 2021-05-30: qty 100

## 2021-05-30 MED ORDER — SODIUM CHLORIDE 0.9 % IV SOLN
Freq: Once | INTRAVENOUS | Status: AC
Start: 2021-05-30 — End: 2021-05-30
  Filled 2021-05-30: qty 250

## 2021-05-30 MED ORDER — DEXTROSE 5 % IV SOLN
56.0000 mg/m2 | Freq: Once | INTRAVENOUS | Status: AC
  Administered 2021-05-30: 100 mg via INTRAVENOUS
  Filled 2021-05-30: qty 30

## 2021-05-30 MED ORDER — FAMOTIDINE 20 MG PO TABS
ORAL_TABLET | ORAL | Status: AC
  Filled 2021-05-30: qty 1

## 2021-05-30 MED ORDER — ZOLEDRONIC ACID 4 MG/100ML IV SOLN
4.0000 mg | Freq: Once | INTRAVENOUS | Status: AC
  Administered 2021-05-30: 4 mg via INTRAVENOUS

## 2021-05-30 MED ORDER — PROCHLORPERAZINE MALEATE 10 MG PO TABS
ORAL_TABLET | ORAL | Status: AC
  Filled 2021-05-30: qty 1

## 2021-05-30 MED ORDER — ACETAMINOPHEN 325 MG PO TABS
ORAL_TABLET | ORAL | Status: AC
  Filled 2021-05-30: qty 2

## 2021-05-30 MED ORDER — DEXAMETHASONE 4 MG PO TABS
12.0000 mg | ORAL_TABLET | Freq: Once | ORAL | Status: AC
  Administered 2021-05-30: 12 mg via ORAL

## 2021-05-30 MED ORDER — HEPARIN SOD (PORK) LOCK FLUSH 100 UNIT/ML IV SOLN
500.0000 [IU] | Freq: Once | INTRAVENOUS | Status: AC | PRN
  Administered 2021-05-30: 500 [IU]
  Filled 2021-05-30: qty 5

## 2021-05-30 MED ORDER — SODIUM CHLORIDE 0.9 % IV SOLN
Freq: Once | INTRAVENOUS | Status: AC
  Filled 2021-05-30: qty 250

## 2021-05-30 MED ORDER — SODIUM CHLORIDE 0.9% FLUSH
10.0000 mL | INTRAVENOUS | Status: DC | PRN
  Administered 2021-05-30: 10 mL
  Filled 2021-05-30: qty 10

## 2021-05-30 MED ORDER — FAMOTIDINE 20 MG PO TABS
20.0000 mg | ORAL_TABLET | Freq: Once | ORAL | Status: AC
  Administered 2021-05-30: 20 mg via ORAL

## 2021-05-30 MED ORDER — SODIUM CHLORIDE 0.9% FLUSH
10.0000 mL | Freq: Once | INTRAVENOUS | Status: AC
  Administered 2021-05-30: 10 mL
  Filled 2021-05-30: qty 10

## 2021-05-30 MED ORDER — DEXAMETHASONE 4 MG PO TABS
ORAL_TABLET | ORAL | Status: AC
  Filled 2021-05-30: qty 3

## 2021-05-30 MED ORDER — ACETAMINOPHEN 325 MG PO TABS
650.0000 mg | ORAL_TABLET | Freq: Once | ORAL | Status: AC
  Administered 2021-05-30: 650 mg via ORAL

## 2021-05-30 NOTE — Patient Instructions (Signed)
[facility]  Discharge Instructions: Thank you for choosing [facility] to provide your oncology and hematology care.   If you have a lab appointment with the Cancer Center, please go directly to the Cancer Center and check in at the registration area.   Wear comfortable clothing and clothing appropriate for easy access to any Portacath or PICC line.   We strive to give you quality time with your provider. You may need to reschedule your appointment if you arrive late (15 or more minutes).  Arriving late affects you and other patients whose appointments are after yours.  Also, if you miss three or more appointments without notifying the office, you may be dismissed from the clinic at the provider's discretion.      For prescription refill requests, have your pharmacy contact our office and allow 72 hours for refills to be completed.    Today you received the following chemotherapy and/or immunotherapy agents kyprolis      To help prevent nausea and vomiting after your treatment, we encourage you to take your nausea medication as directed.  BELOW ARE SYMPTOMS THAT SHOULD BE REPORTED IMMEDIATELY: . *FEVER GREATER THAN 100.4 F (38 C) OR HIGHER . *CHILLS OR SWEATING . *NAUSEA AND VOMITING THAT IS NOT CONTROLLED WITH YOUR NAUSEA MEDICATION . *UNUSUAL SHORTNESS OF BREATH . *UNUSUAL BRUISING OR BLEEDING . *URINARY PROBLEMS (pain or burning when urinating, or frequent urination) . *BOWEL PROBLEMS (unusual diarrhea, constipation, pain near the anus) . TENDERNESS IN MOUTH AND THROAT WITH OR WITHOUT PRESENCE OF ULCERS (sore throat, sores in mouth, or a toothache) . UNUSUAL RASH, SWELLING OR PAIN  . UNUSUAL VAGINAL DISCHARGE OR ITCHING   Items with * indicate a potential emergency and should be followed up as soon as possible or go to the Emergency Department if any problems should occur.  Please show the CHEMOTHERAPY ALERT CARD or IMMUNOTHERAPY ALERT  CARD at check-in to the Emergency Department and triage nurse.  Should you have questions after your visit or need to cancel or reschedule your appointment, please contact [facility]  Dept: [phone]  and follow the prompts.  Office hours are 8:00 a.m. to 4:30 p.m. Monday - Friday. Please note that voicemails left after 4:00 p.m. may not be returned until the following business day.  We are closed weekends and major holidays. You have access to a nurse at all times for urgent questions. Please call the main number to the clinic Dept: [phone] and follow the prompts.   For any non-urgent questions, you may also contact your provider using MyChart. We now offer e-Visits for anyone 18 and older to request care online for non-urgent symptoms. For details visit [email or web address].   Also download the MyChart app! Go to the app store, search "MyChart", open the app, select [facility], and log in with your MyChart username and password.  Due to Covid, a mask is required upon entering the hospital/clinic. If you do not have a mask, one will be given to you upon arrival. For doctor visits, patients may have 1 support person aged 18 or older with them. For treatment visits, patients cannot have anyone with them due to current Covid guidelines and our immunocompromised population.   

## 2021-05-30 NOTE — Progress Notes (Signed)
Ca 8.8, Alb 3.5, CCa 9.2 - ok to proceed with Zometa today.  Raul Del Todd [place], [place], BCPS, BCOP 05/30/2021 12:14 PM

## 2021-06-02 ENCOUNTER — Other Ambulatory Visit: Payer: Self-pay

## 2021-06-02 ENCOUNTER — Ambulatory Visit
Admission: RE | Admit: 2021-06-02 | Discharge: 2021-06-02 | Disposition: A | Discharge status: Home / Self Care | Payer: Medicare PPO | Source: Ambulatory Visit | Attending: Radiation Oncology | Admitting: Radiation Oncology

## 2021-06-02 DIAGNOSIS — Z510 Encounter for antineoplastic radiation therapy: Secondary | ICD-10-CM | POA: Diagnosis not present

## 2021-06-02 DIAGNOSIS — C9000 Multiple myeloma not having achieved remission: Secondary | ICD-10-CM | POA: Diagnosis not present

## 2021-06-03 ENCOUNTER — Ambulatory Visit
Admission: RE | Admit: 2021-06-03 | Discharge: 2021-06-03 | Disposition: A | Discharge status: Home / Self Care | Payer: Medicare PPO | Source: Ambulatory Visit | Attending: Radiation Oncology | Admitting: Radiation Oncology

## 2021-06-03 DIAGNOSIS — Z510 Encounter for antineoplastic radiation therapy: Secondary | ICD-10-CM | POA: Diagnosis not present

## 2021-06-03 DIAGNOSIS — C9000 Multiple myeloma not having achieved remission: Secondary | ICD-10-CM | POA: Diagnosis not present

## 2021-06-04 ENCOUNTER — Other Ambulatory Visit: Payer: Self-pay | Admitting: *Deleted

## 2021-06-04 ENCOUNTER — Other Ambulatory Visit: Payer: Self-pay

## 2021-06-04 ENCOUNTER — Ambulatory Visit
Admission: RE | Admit: 2021-06-04 | Discharge: 2021-06-04 | Disposition: A | Discharge status: Home / Self Care | Payer: Medicare PPO | Source: Ambulatory Visit | Attending: Radiation Oncology | Admitting: Radiation Oncology

## 2021-06-04 DIAGNOSIS — C9000 Multiple myeloma not having achieved remission: Secondary | ICD-10-CM | POA: Diagnosis not present

## 2021-06-04 DIAGNOSIS — Z510 Encounter for antineoplastic radiation therapy: Secondary | ICD-10-CM | POA: Diagnosis not present

## 2021-06-05 ENCOUNTER — Other Ambulatory Visit: Payer: Medicare PPO

## 2021-06-05 ENCOUNTER — Ambulatory Visit: Payer: Medicare PPO

## 2021-06-05 ENCOUNTER — Inpatient Hospital Stay: Payer: Medicare PPO

## 2021-06-05 ENCOUNTER — Ambulatory Visit
Admission: RE | Admit: 2021-06-05 | Discharge: 2021-06-05 | Disposition: A | Discharge status: Home / Self Care | Payer: Medicare PPO | Source: Ambulatory Visit | Attending: Radiation Oncology | Admitting: Radiation Oncology

## 2021-06-05 VITALS — BP 119/62 | HR 65 | Temp 98.2°F | Resp 16 | Wt 154.8 lb

## 2021-06-05 DIAGNOSIS — C9000 Multiple myeloma not having achieved remission: Secondary | ICD-10-CM

## 2021-06-05 DIAGNOSIS — Z79899 Other long term (current) drug therapy: Secondary | ICD-10-CM | POA: Diagnosis not present

## 2021-06-05 DIAGNOSIS — Z7189 Other specified counseling: Secondary | ICD-10-CM

## 2021-06-05 DIAGNOSIS — Z95828 Presence of other vascular implants and grafts: Secondary | ICD-10-CM

## 2021-06-05 DIAGNOSIS — Z510 Encounter for antineoplastic radiation therapy: Secondary | ICD-10-CM | POA: Diagnosis not present

## 2021-06-05 LAB — CBC WITH DIFFERENTIAL (CANCER CENTER ONLY)
Abs Immature Granulocytes: 0.03 K/uL (ref 0.00–0.07)
Basophils Absolute: 0 K/uL (ref 0.0–0.1)
Basophils Relative: 1 %
Eosinophils Absolute: 0.1 K/uL (ref 0.0–0.5)
Eosinophils Relative: 3 %
HCT: 34.1 % — ABNORMAL LOW (ref 36.0–46.0)
Hemoglobin: 11.7 g/dL — ABNORMAL LOW (ref 12.0–15.0)
Immature Granulocytes: 1 %
Lymphocytes Relative: 27 %
Lymphs Abs: 0.9 K/uL (ref 0.7–4.0)
MCH: 34.9 pg — ABNORMAL HIGH (ref 26.0–34.0)
MCHC: 34.3 g/dL (ref 30.0–36.0)
MCV: 101.8 fL — ABNORMAL HIGH (ref 80.0–100.0)
Monocytes Absolute: 0.8 K/uL (ref 0.1–1.0)
Monocytes Relative: 24 %
Neutro Abs: 1.5 K/uL — ABNORMAL LOW (ref 1.7–7.7)
Neutrophils Relative %: 44 %
Platelet Count: 70 K/uL — ABNORMAL LOW (ref 150–400)
RBC: 3.35 MIL/uL — ABNORMAL LOW (ref 3.87–5.11)
RDW: 13.4 % (ref 11.5–15.5)
WBC Count: 3.4 K/uL — ABNORMAL LOW (ref 4.0–10.5)
nRBC: 0 % (ref 0.0–0.2)

## 2021-06-05 LAB — CMP (CANCER CENTER ONLY)
ALT: 12 U/L (ref 0–44)
AST: 10 U/L — ABNORMAL LOW (ref 15–41)
Albumin: 3.1 g/dL — ABNORMAL LOW (ref 3.5–5.0)
Alkaline Phosphatase: 83 U/L (ref 38–126)
Anion gap: 9 (ref 5–15)
BUN: 17 mg/dL (ref 8–23)
CO2: 21 mmol/L — ABNORMAL LOW (ref 22–32)
Calcium: 9.2 mg/dL (ref 8.9–10.3)
Chloride: 108 mmol/L (ref 98–111)
Creatinine: 0.81 mg/dL (ref 0.44–1.00)
GFR, Estimated: >60 mL/min (ref >60)
Glucose, Bld: 201 mg/dL — ABNORMAL HIGH (ref 70–99)
Potassium: 3.7 mmol/L (ref 3.5–5.1)
Sodium: 138 mmol/L (ref 135–145)
Total Bilirubin: 0.9 mg/dL (ref 0.3–1.2)
Total Protein: 5.8 g/dL — ABNORMAL LOW (ref 6.5–8.1)

## 2021-06-05 MED ORDER — FAMOTIDINE 20 MG PO TABS
20.0000 mg | ORAL_TABLET | Freq: Once | ORAL | Status: AC
  Administered 2021-06-05: 20 mg via ORAL

## 2021-06-05 MED ORDER — DEXAMETHASONE 4 MG PO TABS
12.0000 mg | ORAL_TABLET | Freq: Once | ORAL | Status: AC
  Administered 2021-06-05: 12 mg via ORAL

## 2021-06-05 MED ORDER — DEXTROSE 5 % IV SOLN
56.0000 mg/m2 | Freq: Once | INTRAVENOUS | Status: DC
  Filled 2021-06-05: qty 50

## 2021-06-05 MED ORDER — SODIUM CHLORIDE 0.9 % IV SOLN
Freq: Once | INTRAVENOUS | Status: AC
  Filled 2021-06-05: qty 250

## 2021-06-05 MED ORDER — ACETAMINOPHEN 325 MG PO TABS
ORAL_TABLET | ORAL | Status: AC
  Filled 2021-06-05: qty 2

## 2021-06-05 MED ORDER — DIPHENHYDRAMINE HCL 25 MG PO TABS
25.0000 mg | ORAL_TABLET | Freq: Once | ORAL | Status: AC
  Administered 2021-06-05: 25 mg via ORAL
  Filled 2021-06-05: qty 1

## 2021-06-05 MED ORDER — FAMOTIDINE 20 MG PO TABS
ORAL_TABLET | ORAL | Status: AC
  Filled 2021-06-05: qty 1

## 2021-06-05 MED ORDER — PROCHLORPERAZINE MALEATE 10 MG PO TABS
10.0000 mg | ORAL_TABLET | Freq: Once | ORAL | Status: AC
Start: 2021-06-05 — End: 2021-06-05
  Administered 2021-06-05: 10 mg via ORAL

## 2021-06-05 MED ORDER — DEXTROSE 5 % IV SOLN
56.0000 mg/m2 | Freq: Once | INTRAVENOUS | Status: AC
  Administered 2021-06-05: 100 mg via INTRAVENOUS
  Filled 2021-06-05: qty 15

## 2021-06-05 MED ORDER — PROCHLORPERAZINE MALEATE 10 MG PO TABS
ORAL_TABLET | ORAL | Status: AC
  Filled 2021-06-05: qty 1

## 2021-06-05 MED ORDER — SODIUM CHLORIDE 0.9% FLUSH
10.0000 mL | INTRAVENOUS | Status: DC | PRN
  Administered 2021-06-05: 10 mL
  Filled 2021-06-05: qty 10

## 2021-06-05 MED ORDER — SODIUM CHLORIDE 0.9% FLUSH
10.0000 mL | Freq: Once | INTRAVENOUS | Status: AC
  Administered 2021-06-05: 10 mL
  Filled 2021-06-05: qty 10

## 2021-06-05 MED ORDER — DEXAMETHASONE 4 MG PO TABS
ORAL_TABLET | ORAL | Status: AC
  Filled 2021-06-05: qty 3

## 2021-06-05 MED ORDER — DIPHENHYDRAMINE HCL 25 MG PO CAPS
ORAL_CAPSULE | ORAL | Status: AC
  Filled 2021-06-05: qty 1

## 2021-06-05 MED ORDER — HEPARIN SOD (PORK) LOCK FLUSH 100 UNIT/ML IV SOLN
500.0000 [IU] | Freq: Once | INTRAVENOUS | Status: AC | PRN
  Administered 2021-06-05: 500 [IU]
  Filled 2021-06-05: qty 5

## 2021-06-05 MED ORDER — ACETAMINOPHEN 325 MG PO TABS
650.0000 mg | ORAL_TABLET | Freq: Once | ORAL | Status: AC
  Administered 2021-06-05: 650 mg via ORAL

## 2021-06-05 NOTE — Progress Notes (Signed)
Ok treat with platelets of 70 per Dr.Dorsey.

## 2021-06-05 NOTE — Patient Instructions (Signed)
[facility]  Discharge Instructions: Thank you for choosing [facility] to provide your oncology and hematology care.   If you have a lab appointment with the Cancer Center, please go directly to the Cancer Center and check in at the registration area.   Wear comfortable clothing and clothing appropriate for easy access to any Portacath or PICC line.   We strive to give you quality time with your provider. You may need to reschedule your appointment if you arrive late (15 or more minutes).  Arriving late affects you and other patients whose appointments are after yours.  Also, if you miss three or more appointments without notifying the office, you may be dismissed from the clinic at the provider's discretion.      For prescription refill requests, have your pharmacy contact our office and allow 72 hours for refills to be completed.    Today you received the following chemotherapy and/or immunotherapy agents kyprolis      To help prevent nausea and vomiting after your treatment, we encourage you to take your nausea medication as directed.  BELOW ARE SYMPTOMS THAT SHOULD BE REPORTED IMMEDIATELY: . *FEVER GREATER THAN 100.4 F (38 C) OR HIGHER . *CHILLS OR SWEATING . *NAUSEA AND VOMITING THAT IS NOT CONTROLLED WITH YOUR NAUSEA MEDICATION . *UNUSUAL SHORTNESS OF BREATH . *UNUSUAL BRUISING OR BLEEDING . *URINARY PROBLEMS (pain or burning when urinating, or frequent urination) . *BOWEL PROBLEMS (unusual diarrhea, constipation, pain near the anus) . TENDERNESS IN MOUTH AND THROAT WITH OR WITHOUT PRESENCE OF ULCERS (sore throat, sores in mouth, or a toothache) . UNUSUAL RASH, SWELLING OR PAIN  . UNUSUAL VAGINAL DISCHARGE OR ITCHING   Items with * indicate a potential emergency and should be followed up as soon as possible or go to the Emergency Department if any problems should occur.  Please show the CHEMOTHERAPY ALERT CARD or IMMUNOTHERAPY ALERT  CARD at check-in to the Emergency Department and triage nurse.  Should you have questions after your visit or need to cancel or reschedule your appointment, please contact [facility]  Dept: [phone]  and follow the prompts.  Office hours are 8:00 a.m. to 4:30 p.m. Monday - Friday. Please note that voicemails left after 4:00 p.m. may not be returned until the following business day.  We are closed weekends and major holidays. You have access to a nurse at all times for urgent questions. Please call the main number to the clinic Dept: [phone] and follow the prompts.   For any non-urgent questions, you may also contact your provider using MyChart. We now offer e-Visits for anyone 18 and older to request care online for non-urgent symptoms. For details visit [email or web address].   Also download the MyChart app! Go to the app store, search "MyChart", open the app, select [facility], and log in with your MyChart username and password.  Due to Covid, a mask is required upon entering the hospital/clinic. If you do not have a mask, one will be given to you upon arrival. For doctor visits, patients may have 1 support person aged 18 or older with them. For treatment visits, patients cannot have anyone with them due to current Covid guidelines and our immunocompromised population.   

## 2021-06-06 ENCOUNTER — Encounter: Payer: Self-pay | Admitting: Radiation Oncology

## 2021-06-06 ENCOUNTER — Ambulatory Visit
Admission: RE | Admit: 2021-06-06 | Discharge: 2021-06-06 | Disposition: A | Discharge status: Home / Self Care | Payer: Medicare PPO | Source: Ambulatory Visit | Attending: Radiation Oncology | Admitting: Radiation Oncology

## 2021-06-06 DIAGNOSIS — Z510 Encounter for antineoplastic radiation therapy: Secondary | ICD-10-CM | POA: Diagnosis not present

## 2021-06-06 DIAGNOSIS — C9000 Multiple myeloma not having achieved remission: Secondary | ICD-10-CM | POA: Diagnosis not present

## 2021-06-12 ENCOUNTER — Encounter: Payer: Self-pay | Admitting: Hematology

## 2021-06-12 NOTE — Progress Notes (Signed)
                                                                                                                                                             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   Plan: The patient will receive a call in about one month from the radiation oncology department. She will continue follow up with Dr. Irene Limbo as well.   ________________________________________________    Carola Rhine, [facility]

## 2021-06-17 ENCOUNTER — Other Ambulatory Visit: Payer: Self-pay | Admitting: *Deleted

## 2021-06-17 ENCOUNTER — Encounter: Payer: Self-pay | Admitting: Hematology

## 2021-06-17 DIAGNOSIS — C9000 Multiple myeloma not having achieved remission: Secondary | ICD-10-CM

## 2021-06-17 DIAGNOSIS — C7951 Secondary malignant neoplasm of bone: Secondary | ICD-10-CM

## 2021-06-17 MED ORDER — VITAMIN D (ERGOCALCIFEROL) 1.25 MG (50000 UNIT) PO CAPS: 50000.0000 [IU] | ORAL_CAPSULE | ORAL | 0 refills | Status: DC

## 2021-06-18 ENCOUNTER — Encounter: Payer: Self-pay | Admitting: Hematology

## 2021-06-18 ENCOUNTER — Other Ambulatory Visit: Payer: Self-pay | Admitting: Hematology

## 2021-06-18 DIAGNOSIS — C9000 Multiple myeloma not having achieved remission: Secondary | ICD-10-CM

## 2021-06-18 MED ORDER — FENTANYL 12 MCG/HR TD PT72: 1.0000 | MEDICATED_PATCH | TRANSDERMAL | 0 refills | Status: DC

## 2021-06-26 ENCOUNTER — Other Ambulatory Visit: Payer: Self-pay

## 2021-06-26 DIAGNOSIS — C9000 Multiple myeloma not having achieved remission: Secondary | ICD-10-CM

## 2021-06-27 ENCOUNTER — Other Ambulatory Visit: Payer: Self-pay

## 2021-06-27 ENCOUNTER — Inpatient Hospital Stay: Payer: Medicare PPO

## 2021-06-27 ENCOUNTER — Inpatient Hospital Stay: Payer: Medicare PPO | Attending: Hematology

## 2021-06-27 VITALS — BP 140/68 | HR 68 | Temp 97.8°F | Resp 17 | Wt 152.2 lb

## 2021-06-27 DIAGNOSIS — Z7189 Other specified counseling: Secondary | ICD-10-CM

## 2021-06-27 DIAGNOSIS — Z95828 Presence of other vascular implants and grafts: Secondary | ICD-10-CM

## 2021-06-27 DIAGNOSIS — C9000 Multiple myeloma not having achieved remission: Secondary | ICD-10-CM | POA: Diagnosis not present

## 2021-06-27 DIAGNOSIS — Z79899 Other long term (current) drug therapy: Secondary | ICD-10-CM | POA: Diagnosis not present

## 2021-06-27 DIAGNOSIS — Z5112 Encounter for antineoplastic immunotherapy: Secondary | ICD-10-CM | POA: Insufficient documentation

## 2021-06-27 LAB — CBC WITH DIFFERENTIAL (CANCER CENTER ONLY)
Abs Immature Granulocytes: 0.01 10*3/uL (ref 0.00–0.07)
Basophils Absolute: 0 10*3/uL (ref 0.0–0.1)
Basophils Relative: 1 %
Eosinophils Absolute: 0.1 10*3/uL (ref 0.0–0.5)
Eosinophils Relative: 2 %
HCT: 34.1 % — ABNORMAL LOW (ref 36.0–46.0)
Hemoglobin: 11.7 g/dL — ABNORMAL LOW (ref 12.0–15.0)
Immature Granulocytes: 0 %
Lymphocytes Relative: 20 %
Lymphs Abs: 0.7 10*3/uL (ref 0.7–4.0)
MCH: 34.7 pg — ABNORMAL HIGH (ref 26.0–34.0)
MCHC: 34.3 g/dL (ref 30.0–36.0)
MCV: 101.2 fL — ABNORMAL HIGH (ref 80.0–100.0)
Monocytes Absolute: 0.7 10*3/uL (ref 0.1–1.0)
Monocytes Relative: 20 %
Neutro Abs: 1.9 10*3/uL (ref 1.7–7.7)
Neutrophils Relative %: 57 %
Platelet Count: 97 10*3/uL — ABNORMAL LOW (ref 150–400)
RBC: 3.37 MIL/uL — ABNORMAL LOW (ref 3.87–5.11)
RDW: 13.6 % (ref 11.5–15.5)
WBC Count: 3.3 10*3/uL — ABNORMAL LOW (ref 4.0–10.5)
nRBC: 0 % (ref 0.0–0.2)

## 2021-06-27 LAB — CMP (CANCER CENTER ONLY)
ALT: 13 U/L (ref 0–44)
AST: 15 U/L (ref 15–41)
Albumin: 3 g/dL — ABNORMAL LOW (ref 3.5–5.0)
Alkaline Phosphatase: 97 U/L (ref 38–126)
Anion gap: 10 (ref 5–15)
BUN: 10 mg/dL (ref 8–23)
CO2: 21 mmol/L — ABNORMAL LOW (ref 22–32)
Calcium: 8.9 mg/dL (ref 8.9–10.3)
Chloride: 105 mmol/L (ref 98–111)
Creatinine: 0.95 mg/dL (ref 0.44–1.00)
GFR, Estimated: >60 mL/min (ref >60)
Glucose, Bld: 261 mg/dL — ABNORMAL HIGH (ref 70–99)
Potassium: 2.9 mmol/L — ABNORMAL LOW (ref 3.5–5.1)
Sodium: 136 mmol/L (ref 135–145)
Total Bilirubin: 0.7 mg/dL (ref 0.3–1.2)
Total Protein: 5.9 g/dL — ABNORMAL LOW (ref 6.5–8.1)

## 2021-06-27 LAB — MAGNESIUM: Magnesium: 1.9 mg/dL (ref 1.7–2.4)

## 2021-06-27 MED ORDER — DEXTROSE 5 % IV SOLN
56.0000 mg/m2 | Freq: Once | INTRAVENOUS | Status: AC
  Administered 2021-06-27: 100 mg via INTRAVENOUS
  Filled 2021-06-27: qty 30

## 2021-06-27 MED ORDER — ZOLEDRONIC ACID 4 MG/100ML IV SOLN
INTRAVENOUS | Status: AC
  Filled 2021-06-27: qty 100

## 2021-06-27 MED ORDER — FAMOTIDINE 20 MG PO TABS
ORAL_TABLET | ORAL | Status: AC
  Filled 2021-06-27: qty 1

## 2021-06-27 MED ORDER — ACETAMINOPHEN 325 MG PO TABS
ORAL_TABLET | ORAL | Status: AC
  Filled 2021-06-27: qty 2

## 2021-06-27 MED ORDER — POTASSIUM CHLORIDE CRYS ER 20 MEQ PO TBCR
40.0000 meq | EXTENDED_RELEASE_TABLET | Freq: Once | ORAL | Status: AC
Start: 2021-06-27 — End: 2021-06-27
  Administered 2021-06-27: 40 meq via ORAL

## 2021-06-27 MED ORDER — PROCHLORPERAZINE MALEATE 10 MG PO TABS
10.0000 mg | ORAL_TABLET | Freq: Once | ORAL | Status: AC
  Administered 2021-06-27: 10 mg via ORAL

## 2021-06-27 MED ORDER — ZOLEDRONIC ACID 4 MG/100ML IV SOLN
4.0000 mg | Freq: Once | INTRAVENOUS | Status: AC
  Administered 2021-06-27: 4 mg via INTRAVENOUS

## 2021-06-27 MED ORDER — POTASSIUM CHLORIDE CRYS ER 20 MEQ PO TBCR
40.0000 meq | EXTENDED_RELEASE_TABLET | Freq: Once | ORAL | Status: AC
  Administered 2021-06-27: 40 meq via ORAL

## 2021-06-27 MED ORDER — PROCHLORPERAZINE MALEATE 10 MG PO TABS
ORAL_TABLET | ORAL | Status: AC
  Filled 2021-06-27: qty 1

## 2021-06-27 MED ORDER — POTASSIUM CHLORIDE CRYS ER 20 MEQ PO TBCR
EXTENDED_RELEASE_TABLET | ORAL | Status: AC
  Filled 2021-06-27: qty 2

## 2021-06-27 MED ORDER — POTASSIUM CHLORIDE CRYS ER 20 MEQ PO TBCR
EXTENDED_RELEASE_TABLET | ORAL | Status: AC
  Filled 2021-06-27: qty 1

## 2021-06-27 MED ORDER — SODIUM CHLORIDE 0.9% FLUSH
10.0000 mL | Freq: Once | INTRAVENOUS | Status: AC
Start: 2021-06-27 — End: 2021-06-27
  Administered 2021-06-27: 10 mL
  Filled 2021-06-27: qty 10

## 2021-06-27 MED ORDER — DEXAMETHASONE 4 MG PO TABS
ORAL_TABLET | ORAL | Status: AC
  Filled 2021-06-27: qty 3

## 2021-06-27 MED ORDER — DIPHENHYDRAMINE HCL 25 MG PO CAPS
ORAL_CAPSULE | ORAL | Status: AC
  Filled 2021-06-27: qty 1

## 2021-06-27 MED ORDER — HEPARIN SOD (PORK) LOCK FLUSH 100 UNIT/ML IV SOLN
500.0000 [IU] | Freq: Once | INTRAVENOUS | Status: AC | PRN
  Administered 2021-06-27: 500 [IU]
  Filled 2021-06-27: qty 5

## 2021-06-27 MED ORDER — SODIUM CHLORIDE 0.9 % IV SOLN
Freq: Once | INTRAVENOUS | Status: DC
  Filled 2021-06-27: qty 250

## 2021-06-27 MED ORDER — DIPHENHYDRAMINE HCL 25 MG PO TABS
25.0000 mg | ORAL_TABLET | Freq: Once | ORAL | Status: AC
  Administered 2021-06-27: 25 mg via ORAL
  Filled 2021-06-27: qty 1

## 2021-06-27 MED ORDER — SODIUM CHLORIDE 0.9% FLUSH
10.0000 mL | INTRAVENOUS | Status: DC | PRN
Start: 2021-06-27 — End: 2021-06-27
  Administered 2021-06-27: 10 mL
  Filled 2021-06-27: qty 10

## 2021-06-27 MED ORDER — SODIUM CHLORIDE 0.9 % IV SOLN
Freq: Once | INTRAVENOUS | Status: AC
  Filled 2021-06-27: qty 250

## 2021-06-27 MED ORDER — FAMOTIDINE 20 MG PO TABS
20.0000 mg | ORAL_TABLET | Freq: Once | ORAL | Status: AC
  Administered 2021-06-27: 20 mg via ORAL

## 2021-06-27 MED ORDER — ACETAMINOPHEN 325 MG PO TABS
650.0000 mg | ORAL_TABLET | Freq: Once | ORAL | Status: AC
  Administered 2021-06-27: 650 mg via ORAL

## 2021-06-27 MED ORDER — DEXAMETHASONE 4 MG PO TABS
12.0000 mg | ORAL_TABLET | Freq: Once | ORAL | Status: AC
Start: 2021-06-27 — End: 2021-06-27
  Administered 2021-06-27: 12 mg via ORAL

## 2021-06-27 NOTE — Progress Notes (Signed)
Received verbal order for 40 Meq KCL now and in 2 hours per Dr. Irene Limbo for K+ of 2.9. Patient is aware to increase K+ at home to 2 tabs Q am and 2 tabs Q pm for a total of 80 MeQ daily.  Okay to treat with plt of 97 per Dr. Irene Limbo

## 2021-06-27 NOTE — Patient Instructions (Signed)
[facility] ONCOLOGY  Discharge Instructions: Thank you for choosing [facility] to provide your oncology and hematology care.   If you have a lab appointment with the [facility], please go directly to the [facility] and check in at the registration area.   Wear comfortable clothing and clothing appropriate for easy access to any Portacath or PICC line.   We strive to give you quality time with your provider. You may need to reschedule your appointment if you arrive late (15 or more minutes).  Arriving late affects you and other patients whose appointments are after yours.  Also, if you miss three or more appointments without notifying the office, you may be dismissed from the clinic at the provider's discretion.      For prescription refill requests, have your pharmacy contact our office and allow 72 hours for refills to be completed.    Today you received the following chemotherapy and/or immunotherapy agents: Kyprolis    To help prevent nausea and vomiting after your treatment, we encourage you to take your nausea medication as directed.  BELOW ARE SYMPTOMS THAT SHOULD BE REPORTED IMMEDIATELY: *FEVER GREATER THAN 100.4 F (38 C) OR HIGHER *CHILLS OR SWEATING *NAUSEA AND VOMITING THAT IS NOT CONTROLLED WITH YOUR NAUSEA MEDICATION *UNUSUAL SHORTNESS OF BREATH *UNUSUAL BRUISING OR BLEEDING *URINARY PROBLEMS (pain or burning when urinating, or frequent urination) *BOWEL PROBLEMS (unusual diarrhea, constipation, pain near the anus) TENDERNESS IN MOUTH AND THROAT WITH OR WITHOUT PRESENCE OF ULCERS (sore throat, sores in mouth, or a toothache) UNUSUAL RASH, SWELLING OR PAIN  UNUSUAL VAGINAL DISCHARGE OR ITCHING   Items with * indicate a potential emergency and should be followed up as soon as possible or go to the Emergency Department if any problems should occur.  Please show the CHEMOTHERAPY ALERT CARD or IMMUNOTHERAPY ALERT CARD at check-in to the  Emergency Department and triage nurse.  Should you have questions after your visit or need to cancel or reschedule your appointment, please contact [facility]  Dept: [phone]  and follow the prompts.  Office hours are 8:00 a.m. to 4:30 p.m. Monday - Friday. Please note that voicemails left after 4:00 p.m. may not be returned until the following business day.  We are closed weekends and major holidays. You have access to a nurse at all times for urgent questions. Please call the main number to the clinic Dept: [phone] and follow the prompts.   For any non-urgent questions, you may also contact your provider using MyChart. We now offer e-Visits for anyone 41 and older to request care online for non-urgent symptoms. For details visit [email or web address].   Also download the MyChart app! Go to the app store, search "MyChart", open the app, select [facility], and log in with your MyChart username and password.  Due to Covid, a mask is required upon entering the hospital/clinic. If you do not have a mask, one will be given to you upon arrival. For doctor visits, patients may have 1 support person aged 12 or older with them. For treatment visits, patients cannot have anyone with them due to current Covid guidelines and our immunocompromised population.   Hypokalemia Hypokalemia means that the amount of potassium in the blood is lower than normal. Potassium is a chemical (electrolyte) that helps regulate the amount of fluid in the body. It also stimulates muscle tightening (contraction) and helps nerves work properly. Normally, most of the body's potassium is inside cells, and  only a very small amount is in the blood. Because the amount in the blood is so small, minorchanges to potassium levels in the blood can be life-threatening. What are the causes? This condition may be caused by: Antibiotic medicine. Diarrhea or vomiting. Taking too much of a medicine that  helps you have a bowel movement (laxative) can cause diarrhea and lead to hypokalemia. Chronic kidney disease (CKD). Medicines that help the body get rid of excess fluid (diuretics). Eating disorders, such as bulimia. Low magnesium levels in the body. Sweating a lot. What are the signs or symptoms? Symptoms of this condition include: Weakness. Constipation. Fatigue. Muscle cramps. Mental confusion. Skipped heartbeats or irregular heartbeat (palpitations). Tingling or numbness. How is this diagnosed? This condition is diagnosed with a blood test. How is this treated? This condition may be treated by: Taking potassium supplements by mouth. Adjusting the medicines that you take. Eating more foods that contain a lot of potassium. If your potassium level is very low, you may need to get potassium through anIV and be monitored in the hospital. Follow these instructions at home:  Take over-the-counter and prescription medicines only as told by your health care provider. This includes vitamins and supplements. Eat a healthy diet. A healthy diet includes fresh fruits and vegetables, whole grains, healthy fats, and lean proteins. If instructed, eat more foods that contain a lot of potassium. This includes: Nuts, such as peanuts and pistachios. Seeds, such as sunflower seeds and pumpkin seeds. Peas, lentils, and lima beans. Whole grain and bran cereals and breads. Fresh fruits and vegetables, such as apricots, avocado, bananas, cantaloupe, kiwi, oranges, tomatoes, asparagus, and potatoes. Orange juice. Tomato juice. Red meats. Yogurt. Keep all follow-up visits as told by your health care provider. This is important. Contact a health care provider if you: Have weakness that gets worse. Feel your heart pounding or racing. Vomit. Have diarrhea. Have diabetes (diabetes mellitus) and you have trouble keeping your blood sugar (glucose) in your target range. Get help right away if  you: Have chest pain. Have shortness of breath. Have vomiting or diarrhea that lasts for more than 2 days. Faint. Summary Hypokalemia means that the amount of potassium in the blood is lower than normal. This condition is diagnosed with a blood test. Hypokalemia may be treated by taking potassium supplements, adjusting the medicines that you take, or eating more foods that are high in potassium. If your potassium level is very low, you may need to get potassium through an IV and be monitored in the hospital. This information is not intended to replace advice given to you by your health care provider. Make sure you discuss any questions you have with your healthcare provider. Document Revised: 07/13/2018 Document Reviewed: 07/13/2018 Elsevier Patient Education  [place].

## 2021-06-29 ENCOUNTER — Encounter: Payer: Self-pay | Admitting: Hematology

## 2021-06-30 ENCOUNTER — Other Ambulatory Visit: Payer: Self-pay

## 2021-06-30 ENCOUNTER — Encounter: Payer: Self-pay | Admitting: Hematology

## 2021-06-30 DIAGNOSIS — C9000 Multiple myeloma not having achieved remission: Secondary | ICD-10-CM

## 2021-07-01 ENCOUNTER — Encounter: Payer: Self-pay | Admitting: Hematology

## 2021-07-01 MED ORDER — OXYCODONE HCL 10 MG PO TABS: 10.0000 mg | ORAL_TABLET | Freq: Four times a day (QID) | ORAL | 0 refills | Status: DC | PRN

## 2021-07-02 ENCOUNTER — Other Ambulatory Visit: Payer: Self-pay | Admitting: *Deleted

## 2021-07-02 DIAGNOSIS — C9000 Multiple myeloma not having achieved remission: Secondary | ICD-10-CM

## 2021-07-02 NOTE — Progress Notes (Signed)
HEMATOLOGY/ONCOLOGY CLINIC NOTE  Date of Service: 07/02/2021  Patient Care Team: Hoyt Koch, MD as PCP - General (Internal Medicine) Lafayette Dragon, MD (Inactive) as Consulting Physician (Gastroenterology) Megan Salon, MD as Consulting Physician (Gynecology) Melrose Nakayama, MD as Consulting Physician (Orthopedic Surgery) Deneise Lever, MD as Consulting Physician (Pulmonary Disease) [name], MD (Ophthalmology)  CHIEF COMPLAINTS/PURPOSE OF CONSULTATION:  Continue mx of myeloma  HISTORY OF PRESENTING ILLNESS:   Faith Orr is a wonderful 80 y.o. female who has been referred to [name] by Dr. Pricilla Holm for evaluation and management of Lytic bone lesions. She is accompanied today by her son in law, and her partner is present via phone. The pt reports that she is doing well overall.   The pt notes that 2-3 months ago while standing at the stove, and otherwise feeling normally, she felt "something snap that took her breath away" in her mid back as she stretched to get something. The pt notes that she saw a chiropractor twice due to her back stiffness, which did not help. The pt then described her new bone pains to her PCP on 12/05/18, and subsequent imaging, as noted below, revealed concerns for numerous bone lesions. She has begun 78m Fosamax. The pt notes that she had hip pain in 2018, and that an XR at that time did not reveal any lesions.  The pt notes that most of her pain is concentrated to her left shoulder presently, and with minimal movement of the arm. She endorses pain radiating into her left arm and notes that her hand is swollen in the mornings when she wakes up. She also endorses present back pain and right hip pain, worse when she walks. She has not yet seen orthopedics. The pt reports that she is needing to take 6063mAdvil every 4-6 hours as Tramadol alone has not been able to alleviate her pain. The pt denies any unexpected weight loss, fevers,  chills, or night sweats. The pt notes that her urine is a very dark color presently, but denies overt blood in the urine, underpants, nor tissue paper. The pt notes that in the last two weeks she has had some soreness in her head, but denies new headaches or changes in vision. The pt notes that she has been urinating more frequently overall, but has been trying to stay better hydrated as well.  The pt notes that she has been compliant with annual mammograms.   The pt notes that she had a cyst in her right breast which was removed in the past. She fractured her left wrist in 2008 after falling down stairs. The pt endorses history of fatty liver.   The pt denies ever smoking cigarettes and endorses significant second hand smoke exposure with a previous marriage.   Of note prior to the patient's visit today, pt has had a Bone Scan completed on 12/13/18 with results revealing Multifocal uptake throughout the skeleton, consistent with diffuse metastatic disease. Primary tumor is not specified. 2. Uptake in the proximal right femur, consistent with lytic lesions. 3. Uptake in the ribs bilaterally as described. 4. Lesions in the proximal left humerus. 5. Diffuse uptake throughout the skull consistent with metastatic disease. 6. Right paramedian uptake at the manubrium.  Most recent lab results (12/08/18) of CBC w/diff and CMP is as follows: all values are WNL except for Glucose at 279, BUN at 24, AST at 41, ALT at 46. 12/08/18 SPEP revealed all values WNL except for Total  Protein at 6.0, Albumin at 3.6, Gamma globulin at 0.7, and M spike at 0.5g  On review of systems, pt reports significant left shoulder pain, back pain, right hip pain, dark urine, and denies fevers, chills, night sweats, unexpected weight loss, changes in bowel habits, changes in breathing, cough, new respiratory symptoms, changes in vision, abdominal pains, leg swelling, and any other symptoms.   On PMHx the pt reports fatty liver, and  denies blood clots.  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 Denies ever smoking.  On Family Hx the pt reports maternal grandmother with colon cancer. Father with bladder cancer and amyloidosis (pt notes that this could have been misdiagnosis). Mother with Protein S deficiency and polymyalgia rheumatica.  Current Treatment: Carfilzomib + Revlimid maintenance.  INTERVAL HISTORY:   [name] returns today for management and evaluation of her Multiple Myeloma. The patient's last visit with [name] was on 04/29/2021. The pt reports that she is doing well overall.  The pt reports no acute new symptoms. No infection issues. No new treatment toxicities. Lab results today 07/21/222 of CBC w/diff and CMP results reviewed. Completed RT to PET +ve lesions in the left proximal humerus without any acute issues.  On review of systems, pt reports no new bone pains and denies fevers/chills/nightsweats and any other symptoms.   MEDICAL HISTORY:  Past Medical History:  Diagnosis Date   Allergy    seasonal   Asthma    DEPRESSION    DIABETES MELLITUS, TYPE II    Diverticulosis    HYPERLIPIDEMIA    Macular degeneration of left eye    mild, Dr.Hecker   Obesity, unspecified    Osteoarthritis of both knees    OSTEOPENIA    Osteopenia    URINARY INCONTINENCE     SURGICAL HISTORY: Past Surgical History:  Procedure Laterality Date   CATARACT EXTRACTION Left 05/24/2018   CESAREAN SECTION  01/1973   CYSTOSCOPY/URETEROSCOPY/HOLMIUM LASER/STENT PLACEMENT Right 09/20/2019   Procedure: CYSTOSCOPY/URETEROSCOPY/HOLMIUM LASER/STENT PLACEMENT;  Surgeon: Lucas Mallow, MD;  Location: [facility];  Service: Urology;  Laterality: Right;   FRACTURE SURGERY     IR IMAGING GUIDED PORT INSERTION  02/20/2019   left wrist surgery  2008   By Dr. Latanya Maudlin   right ankle  1994    SOCIAL HISTORY: Social History   Socioeconomic History   Marital  status: Married    Spouse name: Not on file   Number of children: 1   Years of education: Not on file   Highest education level: Not on file  Occupational History    Employer: [employer]  Tobacco Use   Smoking status: Never   Smokeless tobacco: Never   Tobacco comments:    Lives with partner [name]) and son  Vaping Use   Vaping Use: Never used  Substance and Sexual Activity   Alcohol use: No    Alcohol/week: 0.0 standard drinks   Drug use: No   Sexual activity: Never    Partners: Female    Birth control/protection: Post-menopausal    Comment: Lives with female partner (annette hicks) and 57 yo son  Other Topics Concern   Not on file  Social History Narrative   Not on file   Social Determinants of Health   Financial Resource Strain: Not on file  Food Insecurity: Not on file  Transportation Needs: Not on file  Physical Activity: Not on file  Stress: Not on  file  Social Connections: Not on file  Intimate Partner Violence: Not At Risk   Fear of Current or Ex-Partner: No   Emotionally Abused: No   Physically Abused: No   Sexually Abused: No    FAMILY HISTORY: Family History  Problem Relation Age of Onset   Diabetes Father    Hyperlipidemia Father    Heart disease Father    Cancer Father    Hypertension Father    Colon cancer Paternal Grandmother 41   Osteoporosis Mother    Protein S deficiency Mother    Hyperlipidemia Mother    Multiple sclerosis Daughter    Cancer Other        bladder   Breast cancer Neg Hx     ALLERGIES:  is allergic to penicillins, aleve [naproxen sodium], and sulfonamide derivatives.  MEDICATIONS:  Current Outpatient Medications  Medication Sig Dispense Refill   acyclovir (ZOVIRAX) 400 MG tablet TAKE 1 TABLET(400 MG) BY MOUTH TWICE DAILY 60 tablet 5   Blood Glucose Monitoring Suppl (FREESTYLE FREEDOM LITE) W/DEVICE KIT Use to check blood sugars twice a day Dx 250.00 1 each 0   Calcium Carbonate-Vitamin D 600-400  MG-UNIT tablet Take 1 tablet by mouth 2 (two) times daily.     Cetirizine HCl 10 MG CAPS Take 1 capsule (10 mg total) by mouth daily. 30 capsule 1   clindamycin (CLEOCIN) 300 MG capsule Take 2 capsules (600 mg total) by mouth once as needed for up to 1 dose (1 hour prior to dental procedures). 2 capsule 0   dexamethasone (DECADRON) 4 MG tablet Take 5 tablets (20 mg total) by mouth once a week. On D22 of each cycle of treatment 20 tablet 5   ELIQUIS 2.5 MG TABS tablet TAKE 1 TABLET(2.5 MG) BY MOUTH TWICE DAILY 60 tablet 2   fentaNYL (DURAGESIC) 12 MCG/HR Place 1 patch onto the skin every 3 (three) days. 10 patch 0   fluticasone (FLONASE) 50 MCG/ACT nasal spray Place 1 spray into both nostrils daily. 16 g 2   glipiZIDE (GLUCOTROL XL) 5 MG 24 hr tablet TAKE 1 TABLET(5 MG) BY MOUTH DAILY WITH BREAKFAST 90 tablet 1   glucose blood (FREESTYLE LITE) test strip CHECK BLOOD SUGAR TWICE DAILY AS DIRECTED Dx 250.00 180 each 3   Lancets (FREESTYLE) lancets Use twice daily to check sugars. 100 each 11   lenalidomide (REVLIMID) 15 MG capsule TAKE 1 CAPSULE BY MOUTH  DAILY FOR 21 DAYS, THEN 7  DAYS OFF 21 capsule 0   lidocaine-prilocaine (EMLA) cream APPLY 1 APPLICATION TO THE AFFECTED AREA AS NEEDED. USE PRIOR TO PORT ACCESS 30 g 0   metFORMIN (GLUCOPHAGE-XR) 500 MG 24 hr tablet TAKE 3 TABLETS(1500 MG) BY MOUTH DAILY WITH BREAKFAST 270 tablet 1   Multiple Vitamins-Minerals (ICAPS) CAPS Take 1 capsule by mouth daily after breakfast.     ondansetron (ZOFRAN) 8 MG tablet Take 1 tablet (8 mg total) by mouth 2 (two) times daily as needed (Nausea or vomiting). 30 tablet 1   Oxycodone HCl 10 MG TABS Take 1 tablet (10 mg total) by mouth every 6 (six) hours as needed. 90 tablet 0   pantoprazole (PROTONIX) 20 MG tablet TAKE 1 TABLET(20 MG) BY MOUTH DAILY 30 tablet 5   polyethylene glycol (MIRALAX / GLYCOLAX) packet Take 17 g by mouth daily after breakfast.     potassium chloride SA (KLOR-CON) 20 MEQ tablet TAKE 1  TABLET(20 MEQ) BY MOUTH TWICE DAILY 180 tablet 0   prochlorperazine (COMPAZINE) 10 MG  tablet Take 1 tablet (10 mg total) by mouth every 6 (six) hours as needed (Nausea or vomiting). 30 tablet 1   senna-docusate (SENNA S) 8.6-50 MG tablet Take 2 tablets by mouth at bedtime. 60 tablet 2   sertraline (ZOLOFT) 50 MG tablet Take 1 tablet (50 mg total) by mouth daily. 90 tablet 3   simvastatin (ZOCOR) 20 MG tablet TAKE 1 TABLET BY MOUTH EVERY DAY AT 6 PM 90 tablet 3   Triamcinolone Acetonide 0.025 % LOTN Apply 1 application topically 3 (three) times daily as needed (rash/itching). (Patient not taking: Reported on 05/08/2021) 60 mL 2   Vitamin D, Ergocalciferol, (DRISDOL) 1.25 MG (50000 UNIT) CAPS capsule Take 1 capsule (50,000 Units total) by mouth every 7 (seven) days. 12 capsule 0   No current facility-administered medications for this visit.   Facility-Administered Medications Ordered in Other Visits  Medication Dose Route Frequency Provider Last Rate Last Admin   heparin lock flush 100 unit/mL  500 Units Intracatheter Once PRN Brunetta Genera, MD       sodium chloride flush (NS) 0.9 % injection 10 mL  10 mL Intracatheter PRN Brunetta Genera, MD   10 mL at 10/02/19 1524    REVIEW OF SYSTEMS:   10 Point review of Systems was done is negative except as noted above.  PHYSICAL EXAMINATION: ECOG FS:2 - Symptomatic, <50% confined to bed  There were no vitals filed for this visit.  Wt Readings from Last 3 Encounters:  06/27/21 152 lb 4 oz (69.1 kg)  06/05/21 154 lb 12 oz (70.2 kg)  05/30/21 156 lb (70.8 kg)   There is no height or weight on file to calculate BMI.    NAD GENERAL:alert, in no acute distress and comfortable SKIN: no acute rashes, no significant lesions EYES: conjunctiva are pink and non-injected, sclera anicteric OROPHARYNX: MMM, no exudates, no oropharyngeal erythema or ulceration NECK: supple, no JVD LYMPH:  no palpable lymphadenopathy in the cervical, axillary or  inguinal regions LUNGS: clear to auscultation b/l with normal respiratory effort HEART: regular rate & rhythm ABDOMEN:  normoactive bowel sounds , non tender, not distended. Extremity: no pedal edema PSYCH: alert & oriented x 3 with fluent speech NEURO: no focal motor/sensory deficits   LABORATORY DATA:  I have reviewed the data as listed  . CBC Latest Ref Rng & Units 07/03/2021 06/27/2021 06/05/2021  WBC 4.0 - 10.5 K/uL 3.2(L) 3.3(L) 3.4(L)  Hemoglobin 12.0 - 15.0 g/dL 11.6(L) 11.7(L) 11.7(L)  Hematocrit 36.0 - 46.0 % 34.0(L) 34.1(L) 34.1(L)  Platelets 150 - 400 K/uL 70(L) 97(L) 70(L)   . CBC    Component Value Date/Time   WBC 3.2 (L) 07/03/2021 0846   WBC 3.5 (L) 05/30/2021 1030   RBC 3.33 (L) 07/03/2021 0846   HGB 11.6 (L) 07/03/2021 0846   HCT 34.0 (L) 07/03/2021 0846   PLT 70 (L) 07/03/2021 0846   MCV 102.1 (H) 07/03/2021 0846   MCH 34.8 (H) 07/03/2021 0846   MCHC 34.1 07/03/2021 0846   RDW 13.3 07/03/2021 0846   LYMPHSABS 0.8 07/03/2021 0846   MONOABS 0.8 07/03/2021 0846   EOSABS 0.1 07/03/2021 0846   BASOSABS 0.0 07/03/2021 0846    . CMP Latest Ref Rng & Units 06/27/2021 06/05/2021 05/30/2021  Glucose 70 - 99 mg/dL 261(H) 201(H) 247(H)  BUN 8 - 23 mg/dL _0 Creatinine 0.44 - 1.00 mg/dL 0.95 0.81 0.93  Sodium 135 - 145 mmol/L 136 138 138  Potassium 3.5 - 5.1 mmol/L  2.9(L) 3.7 3.3(L)  Chloride 98 - 111 mmol/L 105 108 109  CO2 22 - 32 mmol/L 21(L) 21(L) 22  Calcium 8.9 - 10.3 mg/dL 8.9 9.2 8.8(L)  Total Protein 6.5 - 8.1 g/dL 5.9(L) 5.8(L) 6.1(L)  Total Bilirubin 0.3 - 1.2 mg/dL 0.7 0.9 0.6  Alkaline Phos 38 - 126 U/L 97 83 77  AST 15 - 41 U/L 15 10(L) 19  ALT 0 - 44 U/L _0 09/18/2019 BM Bx Report ([id number])   09/18/2019 FISH Panel    05/30/2019 BM Bx   01/06/2019 BM Bx:     01/06/19 Cytogenetics:      05/30/19 BM Biopsy:   09/18/2019 FISH Panel    09/18/2019 BM Surgical Pathology ([id number])     RADIOGRAPHIC  STUDIES: I have personally reviewed the radiological images as listed and agreed with the findings in the report. No results found.   ASSESSMENT & PLAN:   80 y.o. female with  1. Recently diagnosed Multiple Myeloma, RISS Stage III  Labs upon initial presentation from 12/08/18, blood counts are normal including WBC at 7.1k, HGB at 13.1, and PLT at 245k. Calcium normal at 10.3. Creatinine normal at 0.63. M spike at 0.5g. 12/13/18 Bone Scan revealed Multifocal uptake throughout the skeleton, consistent with diffuse metastatic disease. Primary tumor is not specified. 2. Uptake in the proximal right femur, consistent with lytic lesions. 3. Uptake in the ribs bilaterally as described. 4. Lesions in the proximal left humerus. 5. Diffuse uptake throughout the skull consistent with metastatic disease. 6. Right paramedian uptake at the manubrium.  12/13/18 CT Right Femur revealed Numerous lytic lesions involving the right femur and a lytic lesion in the left inferior pubic ramus. Overall appearance is most concerning for multiple myeloma  12/27/18 Pretreatment 24hour UPEP observed an M spike at 69m, and showed 1954mtotal protein/day.  12/27/18 Pretreatment MMP revealed M Protein at 0.5g with IgG Lambda specificity. Kappa:Lambda light chain ratio at 0.13, with Lambda at 40.3. There is less abnormal protein and light chains than I would expect from 30% plasma cells, which suggests hypo-secretory or non-secretory neoplastic plasma cells. Will have an impact in assessing response. 01/05/19 PET/CT revealed Innumerable lytic lesions in the skeleton compatible with myeloma. Most of the larger lesions are hypermetabolic, for example including a left proximal humeral shaft lesion with maximum SUV of 8.1 and a 2.8 cm lesion in the left T9 vertebral body with maximum SUV 5.1. Most of the smaller lytic lesions, and some of the larger lesions, do not demonstrate accentuated metabolic activity. 2. 1.2 cm in short axis  lymph node in the left parapharyngeal space is hypermetabolic with maximum SUV 11.8. I do not see a separate mass in the head and neck to give rise to this hypermetabolic lymph node. 3. Mosaic attenuation in the lower lobes, nonspecific possibly from air trapping. 4.  Aortic Atherosclerosis 5. Heterogeneous activity in the liver, making it hard to exclude small liver lesions. Consider hepatic protocol MRI with and without contrast for definitive assessment. Nonobstructive right nephrolithiasis. Old granulomatous disease  01/06/19 Bone Marrow biopsy revealed interstitial increase in plasma cells (28% aspirate, 40% CD138 immunohistochemistry). Plasma cells negative for light chains consistent with a non or weakly secretory myeloma   01/06/19 Cytogenetics revealed 37% of cells with trisomy 11 or 11q deletion, and 40.5% of cells with 17p mutation  S/p 5 cycles of KRD treatment  05/31/19 BM Biopsy revealed mild atypical plasmacytosis at 5% with polytypic variation.   06/01/19 PET/CT  revealed "Dominant lesion in the LEFT humerus is decreased significantly in metabolic activity. Additional hypermetabolic skeletal lytic lesions have decreased in metabolic activity or similar to comparison exam (01/05/2019). No evidence of disease progression. 2. Multiple additional lytic lesions do not have metabolic activity and unchanged. 3. No new skeletal lesions are identified. No soft tissue plasmacytoma identified. 4. Nodule / node in the LEFT parapharyngeal space which is intensely hypermetabolic not changed from prior. 5. New hypermetabolic LEFT lower lobe pulmonary nodule is indeterminate. Recommend close attention on follow-up 6. New obstructive hydronephrosis of the RIGHT kidney related to RIGHT UPJ stone."  09/18/2019 BM Bx Report which revealed "Slightly hypercellular bone marrow for age with trilineage hematopoiesis and 1% plasma cells."  09/14/2019 PET/CT Whole Body Scan ([id number]) which revealed "1. There  widespread tiny lytic lesions compatible with multiple myeloma. Index larger lesions are generally similar to the prior exam, with low-grade activity such as the left T9 vertebral body lesion with maximum SUV 4.5. Is mild increase in the activity associated with a mildly sclerotic left proximal humeral lesion, maximum SUV 4.8 (previously 3.5). 2. At the site of the prior left lower lobe nodule is currently more bandlike thickening, with maximum SUV only 1.9, probably benign, continued surveillance of this region suggested. 3. There several small but hypermetabolic lymph nodes. This includes a left parapharyngeal space node measuring 1.0 cm with maximum SUV 12.3 (stable); a left level IB lymph node measuring 0.5 cm with maximum SUV 4.8 (slightly larger than prior); and a left inguinal lymph node measuring 0.7 cm in short axis with maximum SUV 6.4 (previously 0.5 cm with maximum SUV 0.6). Significance of these lymph nodes uncertain, surveillance is recommended. 4. New 5 mm left lower lobe subpleural nodule on image 32/8, not appreciably hypermetabolic, surveillance suggested. 5. Focal subcutaneous stranding along the left perineum measuring about 2.6 by 1.1 cm on image 221/4, maximum SUV 12.5. This was not present previously and is most likely inflammatory, although given the notable SUV, surveillance of this region is suggested. 6. Other imaging findings of potential clinical significance: Aortic Atherosclerosis (ICD10-I70.0). Coronary atherosclerosis. Old granulomatous disease. Mild right hydronephrosis due to a 7 mm right UPJ calculus. 2 mm right kidney upper pole nonobstructive renal calculus. Prominent stool throughout the colon favors constipation."  12/19/2019 Thoracic & Lumbar Spine MRI ([id number]) ([id number]) revealed "Suspected myeloma lesions at T9 and S1. No compression deformity or epidural disease.  01/18/2020 PET/CT ([id number]) which revealed "1. Stable lytic lesions throughout the skeleton. The  larger lytic lesions which had mild metabolic activity on comparison exam now have background metabolic activity. No evidence of active myeloma. No evidence of progression multiple myeloma.  No plasmacytoma 3. Hypermetabolic nodules in the LEFT neck may be associated deep tissues of the LEFT parotid gland. 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  01/17/19 MRI Liver revealed Several appreciable liver lesions all have benign imaging characteristics. No MRI findings of metastatic involvement of the liver. 2. Scattered bony lesions corresponding to the lytic lesions seen at PET-CT, compatible with active myeloma. 3. Aortic Atherosclerosis.  Mild cardiomegaly. 4. Diffuse hepatic steatosis.   3. Left lower lobe pulmonary nodule First seen on 06/01/19 PET/CT PET/CT 2/44/0102: No hypermetabolic mediastinal or hilar nodes. No suspicious pulmonary nodules on the CT scan.  4. 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  Has been stable on last couple of scans. Being managed conservatively as per patient's preference.  No symptoms from this currently.  PLAN: -Discussed pt labwork  today, 07/03/2021; reviewed PLT 70k -The pt has no prohibitive toxicities from continuing Carfilzomib & Revlimid at this time. -Will continue Carflizomib at 56 mg/m^2 q2weeks for maintenance. -Continue 15 mg Revlimid 3 weeks on, 1 week off for maintenance. No prohibitive toxicities at this time. -if progressive thrombocytopenia will need to lower dose of Revlimid. -Continue 2.5 Eliquis BID for VTE prophylaxis -Continue Potassium daily.  -Continue Zometa q4weeks -Will see back in 2 months with labs.   FOLLOW UP: Please schedule next 3 cycles [6 doses] of carfilzomib with port flush and  labs. MD visit in 2 months [place]   All of the patient's questions were answered with apparent satisfaction. The patient knows to call the clinic with any problems, questions or concerns.  The total time spent in the appointment was 30 minutes and more than 50% was on counseling and direct patient cares.    Sullivan Lone MD [place] AAHIVMS [facility] Hematology/Oncology Physician [facility]  (Office):       [phone] (Work cell):  [phone] (Fax):           [phone]  07/02/2021 5:45 PM  I, Reinaldo Raddle, am acting as scribe for Dr. Sullivan Lone, MD.   .I have reviewed the above documentation for accuracy and completeness, and I agree with the above. Brunetta Genera MD

## 2021-07-03 ENCOUNTER — Inpatient Hospital Stay: Payer: Medicare PPO

## 2021-07-03 ENCOUNTER — Other Ambulatory Visit: Payer: Self-pay

## 2021-07-03 ENCOUNTER — Other Ambulatory Visit: Payer: Self-pay | Admitting: Hematology

## 2021-07-03 ENCOUNTER — Inpatient Hospital Stay: Payer: Medicare PPO | Admitting: Hematology

## 2021-07-03 VITALS — BP 116/53 | HR 76 | Temp 98.0°F | Resp 18 | Ht 59.0 in | Wt 154.0 lb

## 2021-07-03 DIAGNOSIS — C9000 Multiple myeloma not having achieved remission: Secondary | ICD-10-CM

## 2021-07-03 DIAGNOSIS — Z79899 Other long term (current) drug therapy: Secondary | ICD-10-CM | POA: Diagnosis not present

## 2021-07-03 DIAGNOSIS — Z5111 Encounter for antineoplastic chemotherapy: Secondary | ICD-10-CM

## 2021-07-03 DIAGNOSIS — Z7189 Other specified counseling: Secondary | ICD-10-CM

## 2021-07-03 DIAGNOSIS — Z5112 Encounter for antineoplastic immunotherapy: Secondary | ICD-10-CM | POA: Diagnosis not present

## 2021-07-03 DIAGNOSIS — Z95828 Presence of other vascular implants and grafts: Secondary | ICD-10-CM

## 2021-07-03 DIAGNOSIS — C7951 Secondary malignant neoplasm of bone: Secondary | ICD-10-CM

## 2021-07-03 LAB — CBC WITH DIFFERENTIAL (CANCER CENTER ONLY)
Abs Immature Granulocytes: 0.02 10*3/uL (ref 0.00–0.07)
Basophils Absolute: 0 10*3/uL (ref 0.0–0.1)
Basophils Relative: 1 %
Eosinophils Absolute: 0.1 10*3/uL (ref 0.0–0.5)
Eosinophils Relative: 3 %
HCT: 34 % — ABNORMAL LOW (ref 36.0–46.0)
Hemoglobin: 11.6 g/dL — ABNORMAL LOW (ref 12.0–15.0)
Immature Granulocytes: 1 %
Lymphocytes Relative: 24 %
Lymphs Abs: 0.8 10*3/uL (ref 0.7–4.0)
MCH: 34.8 pg — ABNORMAL HIGH (ref 26.0–34.0)
MCHC: 34.1 g/dL (ref 30.0–36.0)
MCV: 102.1 fL — ABNORMAL HIGH (ref 80.0–100.0)
Monocytes Absolute: 0.8 10*3/uL (ref 0.1–1.0)
Monocytes Relative: 26 %
Neutro Abs: 1.5 10*3/uL — ABNORMAL LOW (ref 1.7–7.7)
Neutrophils Relative %: 45 %
Platelet Count: 70 10*3/uL — ABNORMAL LOW (ref 150–400)
RBC: 3.33 MIL/uL — ABNORMAL LOW (ref 3.87–5.11)
RDW: 13.3 % (ref 11.5–15.5)
WBC Count: 3.2 10*3/uL — ABNORMAL LOW (ref 4.0–10.5)
nRBC: 0 % (ref 0.0–0.2)

## 2021-07-03 LAB — CMP (CANCER CENTER ONLY)
ALT: 13 U/L (ref 0–44)
AST: 15 U/L (ref 15–41)
Albumin: 3.2 g/dL — ABNORMAL LOW (ref 3.5–5.0)
Alkaline Phosphatase: 77 U/L (ref 38–126)
Anion gap: 7 (ref 5–15)
BUN: 24 mg/dL — ABNORMAL HIGH (ref 8–23)
CO2: 25 mmol/L (ref 22–32)
Calcium: 9.7 mg/dL (ref 8.9–10.3)
Chloride: 104 mmol/L (ref 98–111)
Creatinine: 0.92 mg/dL (ref 0.44–1.00)
GFR, Estimated: >60 mL/min (ref >60)
Glucose, Bld: 182 mg/dL — ABNORMAL HIGH (ref 70–99)
Potassium: 4 mmol/L (ref 3.5–5.1)
Sodium: 136 mmol/L (ref 135–145)
Total Bilirubin: 0.5 mg/dL (ref 0.3–1.2)
Total Protein: 5.9 g/dL — ABNORMAL LOW (ref 6.5–8.1)

## 2021-07-03 LAB — MAGNESIUM: Magnesium: 2.2 mg/dL (ref 1.7–2.4)

## 2021-07-03 MED ORDER — DEXTROSE 5 % IV SOLN
56.0000 mg/m2 | Freq: Once | INTRAVENOUS | Status: AC
  Administered 2021-07-03: 100 mg via INTRAVENOUS
  Filled 2021-07-03: qty 30

## 2021-07-03 MED ORDER — FAMOTIDINE 20 MG PO TABS
ORAL_TABLET | ORAL | Status: AC
  Filled 2021-07-03: qty 1

## 2021-07-03 MED ORDER — SODIUM CHLORIDE 0.9 % IV SOLN
Freq: Once | INTRAVENOUS | Status: AC
  Filled 2021-07-03: qty 250

## 2021-07-03 MED ORDER — DEXTROSE 5 % IV SOLN: 56.0000 mg/m2 | Freq: Once | INTRAVENOUS | Status: DC

## 2021-07-03 MED ORDER — ACETAMINOPHEN 325 MG PO TABS
ORAL_TABLET | ORAL | Status: AC
  Filled 2021-07-03: qty 2

## 2021-07-03 MED ORDER — DEXAMETHASONE 4 MG PO TABS
ORAL_TABLET | ORAL | Status: AC
  Filled 2021-07-03: qty 3

## 2021-07-03 MED ORDER — PROCHLORPERAZINE MALEATE 10 MG PO TABS
ORAL_TABLET | ORAL | Status: AC
  Filled 2021-07-03: qty 1

## 2021-07-03 MED ORDER — SODIUM CHLORIDE 0.9% FLUSH
10.0000 mL | Freq: Once | INTRAVENOUS | Status: AC
  Administered 2021-07-03: 10 mL
  Filled 2021-07-03: qty 10

## 2021-07-03 MED ORDER — FAMOTIDINE 20 MG PO TABS
20.0000 mg | ORAL_TABLET | Freq: Once | ORAL | Status: AC
  Administered 2021-07-03: 20 mg via ORAL

## 2021-07-03 MED ORDER — SODIUM CHLORIDE 0.9% FLUSH
10.0000 mL | INTRAVENOUS | Status: DC | PRN
  Administered 2021-07-03: 10 mL
  Filled 2021-07-03: qty 10

## 2021-07-03 MED ORDER — DIPHENHYDRAMINE HCL 25 MG PO TABS
25.0000 mg | ORAL_TABLET | Freq: Once | ORAL | Status: AC
  Administered 2021-07-03: 25 mg via ORAL
  Filled 2021-07-03: qty 1

## 2021-07-03 MED ORDER — PROCHLORPERAZINE MALEATE 10 MG PO TABS
10.0000 mg | ORAL_TABLET | Freq: Once | ORAL | Status: AC
  Administered 2021-07-03: 10 mg via ORAL

## 2021-07-03 MED ORDER — HEPARIN SOD (PORK) LOCK FLUSH 100 UNIT/ML IV SOLN
500.0000 [IU] | Freq: Once | INTRAVENOUS | Status: AC | PRN
  Administered 2021-07-03: 500 [IU]
  Filled 2021-07-03: qty 5

## 2021-07-03 MED ORDER — ACETAMINOPHEN 325 MG PO TABS
650.0000 mg | ORAL_TABLET | Freq: Once | ORAL | Status: AC
  Administered 2021-07-03: 650 mg via ORAL

## 2021-07-03 MED ORDER — DIPHENHYDRAMINE HCL 25 MG PO CAPS
ORAL_CAPSULE | ORAL | Status: AC
  Filled 2021-07-03: qty 1

## 2021-07-03 MED ORDER — DEXAMETHASONE 4 MG PO TABS
12.0000 mg | ORAL_TABLET | Freq: Once | ORAL | Status: AC
  Administered 2021-07-03: 12 mg via ORAL

## 2021-07-03 NOTE — Progress Notes (Signed)
Per Dr Irene Limbo : He is aware Plts 23 and pt is okay to treat

## 2021-07-03 NOTE — Progress Notes (Signed)
Per Dr. Irene Limbo - patient is to continue on current potassium replacement regimen. Patient made aware of these recommendations and verbalized her understanding to continue with current dose.

## 2021-07-03 NOTE — Patient Instructions (Signed)
[facility]   Discharge Instructions: Thank you for choosing [facility] to provide your oncology and hematology care.   If you have a lab appointment with the Cancer Center, please go directly to the Cancer Center and check in at the registration area.   Wear comfortable clothing and clothing appropriate for easy access to any Portacath or PICC line.   We strive to give you quality time with your provider. You may need to reschedule your appointment if you arrive late (15 or more minutes).  Arriving late affects you and other patients whose appointments are after yours.  Also, if you miss three or more appointments without notifying the office, you may be dismissed from the clinic at the provider's discretion.      For prescription refill requests, have your pharmacy contact our office and allow 72 hours for refills to be completed.    Today you received the following chemotherapy and/or immunotherapy agents: Carfilzomib (Kyprolis)     To help prevent nausea and vomiting after your treatment, we encourage you to take your nausea medication as directed.  BELOW ARE SYMPTOMS THAT SHOULD BE REPORTED IMMEDIATELY: *FEVER GREATER THAN 100.4 F (38 C) OR HIGHER *CHILLS OR SWEATING *NAUSEA AND VOMITING THAT IS NOT CONTROLLED WITH YOUR NAUSEA MEDICATION *UNUSUAL SHORTNESS OF BREATH *UNUSUAL BRUISING OR BLEEDING *URINARY PROBLEMS (pain or burning when urinating, or frequent urination) *BOWEL PROBLEMS (unusual diarrhea, constipation, pain near the anus) TENDERNESS IN MOUTH AND THROAT WITH OR WITHOUT PRESENCE OF ULCERS (sore throat, sores in mouth, or a toothache) UNUSUAL RASH, SWELLING OR PAIN  UNUSUAL VAGINAL DISCHARGE OR ITCHING   Items with * indicate a potential emergency and should be followed up as soon as possible or go to the Emergency Department if any problems should occur.  Please show the CHEMOTHERAPY ALERT CARD or IMMUNOTHERAPY ALERT CARD  at check-in to the Emergency Department and triage nurse.  Should you have questions after your visit or need to cancel or reschedule your appointment, please contact [facility]  Dept: [phone]  and follow the prompts.  Office hours are 8:00 a.m. to 4:30 p.m. Monday - Friday. Please note that voicemails left after 4:00 p.m. may not be returned until the following business day.  We are closed weekends and major holidays. You have access to a nurse at all times for urgent questions. Please call the main number to the clinic Dept: [phone] and follow the prompts.   For any non-urgent questions, you may also contact your provider using MyChart. We now offer e-Visits for anyone 18 and older to request care online for non-urgent symptoms. For details visit [email or web address].   Also download the MyChart app! Go to the app store, search "MyChart", open the app, select [facility], and log in with your MyChart username and password.  Due to Covid, a mask is required upon entering the hospital/clinic. If you do not have a mask, one will be given to you upon arrival. For doctor visits, patients may have 1 support person aged 18 or older with them. For treatment visits, patients cannot have anyone with them due to current Covid guidelines and our immunocompromised population.  

## 2021-07-07 ENCOUNTER — Encounter: Payer: Self-pay | Admitting: Hematology

## 2021-07-08 ENCOUNTER — Telehealth: Payer: Self-pay | Admitting: Hematology

## 2021-07-08 ENCOUNTER — Other Ambulatory Visit: Payer: Self-pay

## 2021-07-08 DIAGNOSIS — E876 Hypokalemia: Secondary | ICD-10-CM

## 2021-07-08 MED ORDER — POTASSIUM CHLORIDE CRYS ER 20 MEQ PO TBCR
40.0000 meq | EXTENDED_RELEASE_TABLET | Freq: Two times a day (BID) | ORAL | 1 refills | Status: DC
Start: 2021-07-08 — End: 2021-12-29

## 2021-07-08 NOTE — Telephone Encounter (Signed)
Scheduled follow-up appointments per 7/21 los. Patient is aware. 

## 2021-07-09 ENCOUNTER — Telehealth: Payer: Self-pay | Admitting: Adult Health

## 2021-07-09 NOTE — Telephone Encounter (Signed)
I called patient to discuss Evusheld, a long acting monoclonal antibody injection administered to patients with decreased immune systems or intolerance/allergy to the COVID 19 vaccine as COVID19 prevention.    Unable to reach patient.  LMOM to return my call.  Gracee Ratterree, NP  

## 2021-07-10 ENCOUNTER — Encounter: Payer: Self-pay | Admitting: Hematology

## 2021-07-10 NOTE — Progress Notes (Signed)
  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 During treatment she did very well with radiotherapy and did not have significant desquamation. She reports she is doing well and denies any pain in her left arm.  Impression/Plan: 1. 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 The patient has been doing well since completion of radiotherapy. We discussed that we would be happy to continue to follow her as needed, but she will also continue to follow up with Dr. Irene Limbo in medical oncology.         Carola Rhine, PAC

## 2021-07-16 ENCOUNTER — Other Ambulatory Visit: Payer: Self-pay | Admitting: Hematology

## 2021-07-16 DIAGNOSIS — C9000 Multiple myeloma not having achieved remission: Secondary | ICD-10-CM

## 2021-07-21 ENCOUNTER — Other Ambulatory Visit: Payer: Self-pay

## 2021-07-21 ENCOUNTER — Ambulatory Visit
Admission: RE | Admit: 2021-07-21 | Discharge: 2021-07-21 | Disposition: A | Discharge status: Home / Self Care | Payer: Medicare PPO | Source: Ambulatory Visit | Attending: Radiation Oncology | Admitting: Radiation Oncology

## 2021-07-21 DIAGNOSIS — C9000 Multiple myeloma not having achieved remission: Secondary | ICD-10-CM

## 2021-07-22 ENCOUNTER — Telehealth: Payer: Self-pay | Admitting: Internal Medicine

## 2021-07-22 NOTE — Telephone Encounter (Signed)
LVM for pt to rtn my call at [phone] to schedule AWV with [facility]. Please schedule this appt if pt calls or comes to the office.

## 2021-07-23 ENCOUNTER — Other Ambulatory Visit: Payer: Self-pay

## 2021-07-23 DIAGNOSIS — C9000 Multiple myeloma not having achieved remission: Secondary | ICD-10-CM

## 2021-07-24 ENCOUNTER — Inpatient Hospital Stay: Payer: Medicare PPO | Attending: Hematology

## 2021-07-24 ENCOUNTER — Other Ambulatory Visit: Payer: Self-pay

## 2021-07-24 ENCOUNTER — Inpatient Hospital Stay: Payer: Medicare PPO

## 2021-07-24 VITALS — BP 132/77 | HR 72 | Temp 98.3°F | Resp 18 | Wt 152.8 lb

## 2021-07-24 DIAGNOSIS — Z5112 Encounter for antineoplastic immunotherapy: Secondary | ICD-10-CM | POA: Insufficient documentation

## 2021-07-24 DIAGNOSIS — Z7189 Other specified counseling: Secondary | ICD-10-CM

## 2021-07-24 DIAGNOSIS — Z79899 Other long term (current) drug therapy: Secondary | ICD-10-CM | POA: Diagnosis not present

## 2021-07-24 DIAGNOSIS — C9000 Multiple myeloma not having achieved remission: Secondary | ICD-10-CM

## 2021-07-24 DIAGNOSIS — Z95828 Presence of other vascular implants and grafts: Secondary | ICD-10-CM

## 2021-07-24 LAB — CBC WITH DIFFERENTIAL (CANCER CENTER ONLY)
Abs Immature Granulocytes: 0.01 10*3/uL (ref 0.00–0.07)
Basophils Absolute: 0 10*3/uL (ref 0.0–0.1)
Basophils Relative: 1 %
Eosinophils Absolute: 0.1 10*3/uL (ref 0.0–0.5)
Eosinophils Relative: 5 %
HCT: 35.1 % — ABNORMAL LOW (ref 36.0–46.0)
Hemoglobin: 12.1 g/dL (ref 12.0–15.0)
Immature Granulocytes: 0 %
Lymphocytes Relative: 25 %
Lymphs Abs: 0.6 10*3/uL — ABNORMAL LOW (ref 0.7–4.0)
MCH: 35.3 pg — ABNORMAL HIGH (ref 26.0–34.0)
MCHC: 34.5 g/dL (ref 30.0–36.0)
MCV: 102.3 fL — ABNORMAL HIGH (ref 80.0–100.0)
Monocytes Absolute: 0.5 10*3/uL (ref 0.1–1.0)
Monocytes Relative: 19 %
Neutro Abs: 1.2 10*3/uL — ABNORMAL LOW (ref 1.7–7.7)
Neutrophils Relative %: 50 %
Platelet Count: 91 10*3/uL — ABNORMAL LOW (ref 150–400)
RBC: 3.43 MIL/uL — ABNORMAL LOW (ref 3.87–5.11)
RDW: 14.9 % (ref 11.5–15.5)
WBC Count: 2.5 10*3/uL — ABNORMAL LOW (ref 4.0–10.5)
nRBC: 0 % (ref 0.0–0.2)

## 2021-07-24 LAB — CMP (CANCER CENTER ONLY)
ALT: 18 U/L (ref 0–44)
AST: 11 U/L — ABNORMAL LOW (ref 15–41)
Albumin: 3.2 g/dL — ABNORMAL LOW (ref 3.5–5.0)
Alkaline Phosphatase: 109 U/L (ref 38–126)
Anion gap: 10 (ref 5–15)
BUN: 10 mg/dL (ref 8–23)
CO2: 19 mmol/L — ABNORMAL LOW (ref 22–32)
Calcium: 9 mg/dL (ref 8.9–10.3)
Chloride: 109 mmol/L (ref 98–111)
Creatinine: 1.06 mg/dL — ABNORMAL HIGH (ref 0.44–1.00)
GFR, Estimated: 54 mL/min — ABNORMAL LOW (ref >60)
Glucose, Bld: 239 mg/dL — ABNORMAL HIGH (ref 70–99)
Potassium: 3.8 mmol/L (ref 3.5–5.1)
Sodium: 138 mmol/L (ref 135–145)
Total Bilirubin: 0.7 mg/dL (ref 0.3–1.2)
Total Protein: 5.9 g/dL — ABNORMAL LOW (ref 6.5–8.1)

## 2021-07-24 LAB — MAGNESIUM: Magnesium: 1.9 mg/dL (ref 1.7–2.4)

## 2021-07-24 MED ORDER — ACETAMINOPHEN 325 MG PO TABS
ORAL_TABLET | ORAL | Status: AC
  Filled 2021-07-24: qty 2

## 2021-07-24 MED ORDER — FAMOTIDINE 20 MG PO TABS
ORAL_TABLET | ORAL | Status: AC
  Filled 2021-07-24: qty 1

## 2021-07-24 MED ORDER — ACETAMINOPHEN 325 MG PO TABS
650.0000 mg | ORAL_TABLET | Freq: Once | ORAL | Status: AC
  Administered 2021-07-24: 650 mg via ORAL

## 2021-07-24 MED ORDER — DEXAMETHASONE 4 MG PO TABS
ORAL_TABLET | ORAL | Status: AC
  Filled 2021-07-24: qty 3

## 2021-07-24 MED ORDER — SODIUM CHLORIDE 0.9% FLUSH
10.0000 mL | Freq: Once | INTRAVENOUS | Status: AC
  Administered 2021-07-24: 10 mL
  Filled 2021-07-24: qty 10

## 2021-07-24 MED ORDER — HEPARIN SOD (PORK) LOCK FLUSH 100 UNIT/ML IV SOLN
500.0000 [IU] | Freq: Once | INTRAVENOUS | Status: AC | PRN
  Administered 2021-07-24: 500 [IU]
  Filled 2021-07-24: qty 5

## 2021-07-24 MED ORDER — FAMOTIDINE 20 MG PO TABS
20.0000 mg | ORAL_TABLET | Freq: Once | ORAL | Status: AC
  Administered 2021-07-24: 20 mg via ORAL

## 2021-07-24 MED ORDER — DIPHENHYDRAMINE HCL 25 MG PO CAPS
ORAL_CAPSULE | ORAL | Status: AC
  Filled 2021-07-24: qty 1

## 2021-07-24 MED ORDER — SODIUM CHLORIDE 0.9 % IV SOLN
Freq: Once | INTRAVENOUS | Status: AC
  Filled 2021-07-24: qty 250

## 2021-07-24 MED ORDER — PROCHLORPERAZINE MALEATE 10 MG PO TABS
ORAL_TABLET | ORAL | Status: AC
  Filled 2021-07-24: qty 1

## 2021-07-24 MED ORDER — DEXTROSE 5 % IV SOLN: 56.0000 mg/m2 | Freq: Once | INTRAVENOUS | Status: DC

## 2021-07-24 MED ORDER — DIPHENHYDRAMINE HCL 25 MG PO TABS
25.0000 mg | ORAL_TABLET | Freq: Once | ORAL | Status: AC
  Administered 2021-07-24: 25 mg via ORAL
  Filled 2021-07-24: qty 1

## 2021-07-24 MED ORDER — DEXAMETHASONE 4 MG PO TABS
12.0000 mg | ORAL_TABLET | Freq: Once | ORAL | Status: AC
  Administered 2021-07-24: 12 mg via ORAL

## 2021-07-24 MED ORDER — DEXTROSE 5 % IV SOLN
56.0000 mg/m2 | Freq: Once | INTRAVENOUS | Status: AC
  Administered 2021-07-24: 100 mg via INTRAVENOUS
  Filled 2021-07-24: qty 30

## 2021-07-24 MED ORDER — ZOLEDRONIC ACID 4 MG/100ML IV SOLN
4.0000 mg | Freq: Once | INTRAVENOUS | Status: AC
  Administered 2021-07-24: 4 mg via INTRAVENOUS
  Filled 2021-07-24: qty 100

## 2021-07-24 MED ORDER — SODIUM CHLORIDE 0.9% FLUSH
10.0000 mL | INTRAVENOUS | Status: DC | PRN
  Administered 2021-07-24: 10 mL
  Filled 2021-07-24: qty 10

## 2021-07-24 MED ORDER — PROCHLORPERAZINE MALEATE 10 MG PO TABS
10.0000 mg | ORAL_TABLET | Freq: Once | ORAL | Status: AC
  Administered 2021-07-24: 10 mg via ORAL

## 2021-07-24 NOTE — Progress Notes (Signed)
Per Dr. Lorenso Courier, "OK To Treat w/ANC of 1.2 today".

## 2021-07-24 NOTE — Patient Instructions (Addendum)
[facility]  Discharge Instructions: Thank you for choosing [facility] to provide your oncology and hematology care.   If you have a lab appointment with the [facility], please go directly to the [facility] and check in at the registration area.   Wear comfortable clothing and clothing appropriate for easy access to any Portacath or PICC line.   We strive to give you quality time with your provider. You may need to reschedule your appointment if you arrive late (15 or more minutes).  Arriving late affects you and other patients whose appointments are after yours.  Also, if you miss three or more appointments without notifying the office, you may be dismissed from the clinic at the provider's discretion.      For prescription refill requests, have your pharmacy contact our office and allow 72 hours for refills to be completed.    Today you received the following chemotherapy and/or immunotherapy agents Carfilzomib and Zometa   To help prevent nausea and vomiting after your treatment, we encourage you to take your nausea medication as directed.  BELOW ARE SYMPTOMS THAT SHOULD BE REPORTED IMMEDIATELY: *FEVER GREATER THAN 100.4 F (38 C) OR HIGHER *CHILLS OR SWEATING *NAUSEA AND VOMITING THAT IS NOT CONTROLLED WITH YOUR NAUSEA MEDICATION *UNUSUAL SHORTNESS OF BREATH *UNUSUAL BRUISING OR BLEEDING *URINARY PROBLEMS (pain or burning when urinating, or frequent urination) *BOWEL PROBLEMS (unusual diarrhea, constipation, pain near the anus) TENDERNESS IN MOUTH AND THROAT WITH OR WITHOUT PRESENCE OF ULCERS (sore throat, sores in mouth, or a toothache) UNUSUAL RASH, SWELLING OR PAIN  UNUSUAL VAGINAL DISCHARGE OR ITCHING   Items with * indicate a potential emergency and should be followed up as soon as possible or go to the Emergency Department if any problems should occur.  Please show the CHEMOTHERAPY ALERT CARD or IMMUNOTHERAPY ALERT CARD at  check-in to the Emergency Department and triage nurse.  Should you have questions after your visit or need to cancel or reschedule your appointment, please contact [facility]  Dept: [phone]  and follow the prompts.  Office hours are 8:00 a.m. to 4:30 p.m. Monday - Friday. Please note that voicemails left after 4:00 p.m. may not be returned until the following business day.  We are closed weekends and major holidays. You have access to a nurse at all times for urgent questions. Please call the main number to the clinic Dept: [phone] and follow the prompts.   For any non-urgent questions, you may also contact your provider using MyChart. We now offer e-Visits for anyone 39 and older to request care online for non-urgent symptoms. For details visit [email or web address].   Also download the MyChart app! Go to the app store, search "MyChart", open the app, select [facility], and log in with your MyChart username and password.  Due to Covid, a mask is required upon entering the hospital/clinic. If you do not have a mask, one will be given to you upon arrival. For doctor visits, patients may have 1 support person aged 33 or older with them. For treatment visits, patients cannot have anyone with them due to current Covid guidelines and our immunocompromised population.

## 2021-07-29 ENCOUNTER — Encounter: Payer: Self-pay | Admitting: Hematology

## 2021-07-29 ENCOUNTER — Other Ambulatory Visit: Payer: Self-pay

## 2021-07-29 ENCOUNTER — Encounter: Payer: Self-pay | Admitting: Internal Medicine

## 2021-07-29 DIAGNOSIS — C9000 Multiple myeloma not having achieved remission: Secondary | ICD-10-CM

## 2021-07-29 MED ORDER — APIXABAN 2.5 MG PO TABS: ORAL_TABLET | ORAL | 2 refills | Status: DC

## 2021-07-30 ENCOUNTER — Other Ambulatory Visit: Payer: Self-pay

## 2021-07-30 MED ORDER — SIMVASTATIN 20 MG PO TABS: ORAL_TABLET | ORAL | 3 refills | Status: DC

## 2021-07-31 ENCOUNTER — Other Ambulatory Visit: Payer: Self-pay | Admitting: Hematology

## 2021-07-31 ENCOUNTER — Inpatient Hospital Stay: Payer: Medicare PPO

## 2021-07-31 ENCOUNTER — Other Ambulatory Visit: Payer: Medicare PPO

## 2021-07-31 ENCOUNTER — Other Ambulatory Visit: Payer: Self-pay

## 2021-07-31 ENCOUNTER — Other Ambulatory Visit: Payer: Self-pay | Admitting: *Deleted

## 2021-07-31 ENCOUNTER — Ambulatory Visit: Payer: Medicare PPO

## 2021-07-31 VITALS — BP 123/67 | HR 70 | Temp 97.8°F | Resp 18 | Ht 59.0 in | Wt 155.5 lb

## 2021-07-31 DIAGNOSIS — Z7189 Other specified counseling: Secondary | ICD-10-CM

## 2021-07-31 DIAGNOSIS — C9000 Multiple myeloma not having achieved remission: Secondary | ICD-10-CM | POA: Diagnosis not present

## 2021-07-31 DIAGNOSIS — Z95828 Presence of other vascular implants and grafts: Secondary | ICD-10-CM

## 2021-07-31 DIAGNOSIS — Z79899 Other long term (current) drug therapy: Secondary | ICD-10-CM | POA: Diagnosis not present

## 2021-07-31 DIAGNOSIS — Z5112 Encounter for antineoplastic immunotherapy: Secondary | ICD-10-CM | POA: Diagnosis not present

## 2021-07-31 LAB — CBC WITH DIFFERENTIAL (CANCER CENTER ONLY)
Abs Immature Granulocytes: 0.03 10*3/uL (ref 0.00–0.07)
Basophils Absolute: 0 10*3/uL (ref 0.0–0.1)
Basophils Relative: 1 %
Eosinophils Absolute: 0.1 10*3/uL (ref 0.0–0.5)
Eosinophils Relative: 3 %
HCT: 32.8 % — ABNORMAL LOW (ref 36.0–46.0)
Hemoglobin: 11.2 g/dL — ABNORMAL LOW (ref 12.0–15.0)
Immature Granulocytes: 1 %
Lymphocytes Relative: 28 %
Lymphs Abs: 0.7 10*3/uL (ref 0.7–4.0)
MCH: 35.4 pg — ABNORMAL HIGH (ref 26.0–34.0)
MCHC: 34.1 g/dL (ref 30.0–36.0)
MCV: 103.8 fL — ABNORMAL HIGH (ref 80.0–100.0)
Monocytes Absolute: 0.6 10*3/uL (ref 0.1–1.0)
Monocytes Relative: 26 %
Neutro Abs: 1 10*3/uL — ABNORMAL LOW (ref 1.7–7.7)
Neutrophils Relative %: 41 %
Platelet Count: 77 10*3/uL — ABNORMAL LOW (ref 150–400)
RBC: 3.16 MIL/uL — ABNORMAL LOW (ref 3.87–5.11)
RDW: 14.7 % (ref 11.5–15.5)
WBC Count: 2.5 10*3/uL — ABNORMAL LOW (ref 4.0–10.5)
nRBC: 0 % (ref 0.0–0.2)

## 2021-07-31 LAB — CMP (CANCER CENTER ONLY)
ALT: 11 U/L (ref 0–44)
AST: 9 U/L — ABNORMAL LOW (ref 15–41)
Albumin: 3.1 g/dL — ABNORMAL LOW (ref 3.5–5.0)
Alkaline Phosphatase: 85 U/L (ref 38–126)
Anion gap: 10 (ref 5–15)
BUN: 15 mg/dL (ref 8–23)
CO2: 19 mmol/L — ABNORMAL LOW (ref 22–32)
Calcium: 9.2 mg/dL (ref 8.9–10.3)
Chloride: 108 mmol/L (ref 98–111)
Creatinine: 1.37 mg/dL — ABNORMAL HIGH (ref 0.44–1.00)
GFR, Estimated: 40 mL/min — ABNORMAL LOW (ref >60)
Glucose, Bld: 158 mg/dL — ABNORMAL HIGH (ref 70–99)
Potassium: 3.6 mmol/L (ref 3.5–5.1)
Sodium: 137 mmol/L (ref 135–145)
Total Bilirubin: 0.5 mg/dL (ref 0.3–1.2)
Total Protein: 5.9 g/dL — ABNORMAL LOW (ref 6.5–8.1)

## 2021-07-31 MED ORDER — SODIUM CHLORIDE 0.9% FLUSH
10.0000 mL | INTRAVENOUS | Status: DC | PRN
  Administered 2021-07-31: 10 mL

## 2021-07-31 MED ORDER — SODIUM CHLORIDE 0.9 % IV SOLN: Freq: Once | INTRAVENOUS | Status: AC

## 2021-07-31 MED ORDER — HEPARIN SOD (PORK) LOCK FLUSH 100 UNIT/ML IV SOLN
500.0000 [IU] | Freq: Once | INTRAVENOUS | Status: AC | PRN
  Administered 2021-07-31: 500 [IU]

## 2021-07-31 MED ORDER — FAMOTIDINE 20 MG PO TABS
20.0000 mg | ORAL_TABLET | Freq: Once | ORAL | Status: AC
  Administered 2021-07-31: 20 mg via ORAL
  Filled 2021-07-31: qty 1

## 2021-07-31 MED ORDER — DEXTROSE 5 % IV SOLN
56.0000 mg/m2 | Freq: Once | INTRAVENOUS | Status: AC
  Administered 2021-07-31: 100 mg via INTRAVENOUS
  Filled 2021-07-31: qty 30

## 2021-07-31 MED ORDER — DEXAMETHASONE 4 MG PO TABS
12.0000 mg | ORAL_TABLET | Freq: Once | ORAL | Status: AC
  Administered 2021-07-31: 12 mg via ORAL
  Filled 2021-07-31: qty 3

## 2021-07-31 MED ORDER — PROCHLORPERAZINE MALEATE 10 MG PO TABS
10.0000 mg | ORAL_TABLET | Freq: Once | ORAL | Status: AC
  Administered 2021-07-31: 10 mg via ORAL
  Filled 2021-07-31: qty 1

## 2021-07-31 MED ORDER — DIPHENHYDRAMINE HCL 25 MG PO CAPS
25.0000 mg | ORAL_CAPSULE | Freq: Once | ORAL | Status: AC
  Administered 2021-07-31: 25 mg via ORAL
  Filled 2021-07-31 (×2): qty 1

## 2021-07-31 MED ORDER — SODIUM CHLORIDE 0.9% FLUSH: 10.0000 mL | Freq: Once | INTRAVENOUS | Status: DC

## 2021-07-31 MED ORDER — ACETAMINOPHEN 325 MG PO TABS
650.0000 mg | ORAL_TABLET | Freq: Once | ORAL | Status: AC
  Administered 2021-07-31: 650 mg via ORAL
  Filled 2021-07-31: qty 2

## 2021-07-31 NOTE — Progress Notes (Signed)
ANC 1.0; Plt 77.  Dr. Irene Limbo aware; ok to proceed with chemotherapy.

## 2021-07-31 NOTE — Patient Instructions (Signed)
[facility]  Discharge Instructions: Thank you for choosing [facility] to provide your oncology and hematology care.   If you have a lab appointment with the Cancer Center, please go directly to the Cancer Center and check in at the registration area.   Wear comfortable clothing and clothing appropriate for easy access to any Portacath or PICC line.   We strive to give you quality time with your provider. You may need to reschedule your appointment if you arrive late (15 or more minutes).  Arriving late affects you and other patients whose appointments are after yours.  Also, if you miss three or more appointments without notifying the office, you may be dismissed from the clinic at the provider's discretion.      For prescription refill requests, have your pharmacy contact our office and allow 72 hours for refills to be completed.    Today you received the following chemotherapy and/or immunotherapy agents: Kyprolis    To help prevent nausea and vomiting after your treatment, we encourage you to take your nausea medication as directed.  BELOW ARE SYMPTOMS THAT SHOULD BE REPORTED IMMEDIATELY: . *FEVER GREATER THAN 100.4 F (38 C) OR HIGHER . *CHILLS OR SWEATING . *NAUSEA AND VOMITING THAT IS NOT CONTROLLED WITH YOUR NAUSEA MEDICATION . *UNUSUAL SHORTNESS OF BREATH . *UNUSUAL BRUISING OR BLEEDING . *URINARY PROBLEMS (pain or burning when urinating, or frequent urination) . *BOWEL PROBLEMS (unusual diarrhea, constipation, pain near the anus) . TENDERNESS IN MOUTH AND THROAT WITH OR WITHOUT PRESENCE OF ULCERS (sore throat, sores in mouth, or a toothache) . UNUSUAL RASH, SWELLING OR PAIN  . UNUSUAL VAGINAL DISCHARGE OR ITCHING   Items with * indicate a potential emergency and should be followed up as soon as possible or go to the Emergency Department if any problems should occur.  Please show the CHEMOTHERAPY ALERT CARD or IMMUNOTHERAPY ALERT  CARD at check-in to the Emergency Department and triage nurse.  Should you have questions after your visit or need to cancel or reschedule your appointment, please contact [facility]  Dept: [phone]  and follow the prompts.  Office hours are 8:00 a.m. to 4:30 p.m. Monday - Friday. Please note that voicemails left after 4:00 p.m. may not be returned until the following business day.  We are closed weekends and major holidays. You have access to a nurse at all times for urgent questions. Please call the main number to the clinic Dept: [phone] and follow the prompts.   For any non-urgent questions, you may also contact your provider using MyChart. We now offer e-Visits for anyone 18 and older to request care online for non-urgent symptoms. For details visit [email or web address].   Also download the MyChart app! Go to the app store, search "MyChart", open the app, select [facility], and log in with your MyChart username and password.  Due to Covid, a mask is required upon entering the hospital/clinic. If you do not have a mask, one will be given to you upon arrival. For doctor visits, patients may have 1 support person aged 18 or older with them. For treatment visits, patients cannot have anyone with them due to current Covid guidelines and our immunocompromised population.   

## 2021-08-04 ENCOUNTER — Encounter: Payer: Self-pay | Admitting: Hematology

## 2021-08-04 ENCOUNTER — Other Ambulatory Visit: Payer: Self-pay

## 2021-08-04 DIAGNOSIS — C9000 Multiple myeloma not having achieved remission: Secondary | ICD-10-CM

## 2021-08-05 ENCOUNTER — Encounter: Payer: Self-pay | Admitting: Hematology

## 2021-08-05 MED ORDER — FENTANYL 12 MCG/HR TD PT72: 1.0000 | MEDICATED_PATCH | TRANSDERMAL | 0 refills | Status: DC

## 2021-08-12 ENCOUNTER — Other Ambulatory Visit: Payer: Self-pay | Admitting: Hematology

## 2021-08-12 ENCOUNTER — Encounter: Payer: Self-pay | Admitting: Hematology

## 2021-08-15 ENCOUNTER — Other Ambulatory Visit: Payer: Self-pay

## 2021-08-15 ENCOUNTER — Encounter: Payer: Self-pay | Admitting: Hematology

## 2021-08-15 MED ORDER — LENALIDOMIDE 10 MG PO CAPS: 10.0000 mg | ORAL_CAPSULE | Freq: Every day | ORAL | 0 refills | Status: DC

## 2021-08-18 ENCOUNTER — Encounter: Payer: Self-pay | Admitting: Hematology

## 2021-08-19 ENCOUNTER — Other Ambulatory Visit: Payer: Self-pay

## 2021-08-19 DIAGNOSIS — C9000 Multiple myeloma not having achieved remission: Secondary | ICD-10-CM

## 2021-08-20 ENCOUNTER — Encounter: Payer: Self-pay | Admitting: Hematology

## 2021-08-20 MED ORDER — OXYCODONE HCL 10 MG PO TABS: 10.0000 mg | ORAL_TABLET | Freq: Four times a day (QID) | ORAL | 0 refills | Status: DC | PRN

## 2021-08-21 ENCOUNTER — Other Ambulatory Visit: Payer: Medicare PPO

## 2021-08-21 ENCOUNTER — Inpatient Hospital Stay: Payer: Medicare PPO | Attending: Hematology

## 2021-08-21 ENCOUNTER — Other Ambulatory Visit: Payer: Self-pay

## 2021-08-21 ENCOUNTER — Inpatient Hospital Stay: Payer: Medicare PPO

## 2021-08-21 ENCOUNTER — Ambulatory Visit: Payer: Medicare PPO

## 2021-08-21 VITALS — BP 117/69 | HR 60 | Temp 97.9°F | Resp 16 | Wt 152.8 lb

## 2021-08-21 DIAGNOSIS — Z5112 Encounter for antineoplastic immunotherapy: Secondary | ICD-10-CM | POA: Insufficient documentation

## 2021-08-21 DIAGNOSIS — C9000 Multiple myeloma not having achieved remission: Secondary | ICD-10-CM | POA: Diagnosis not present

## 2021-08-21 DIAGNOSIS — Z95828 Presence of other vascular implants and grafts: Secondary | ICD-10-CM

## 2021-08-21 DIAGNOSIS — Z7189 Other specified counseling: Secondary | ICD-10-CM

## 2021-08-21 DIAGNOSIS — Z79899 Other long term (current) drug therapy: Secondary | ICD-10-CM | POA: Insufficient documentation

## 2021-08-21 LAB — CBC WITH DIFFERENTIAL (CANCER CENTER ONLY)
Abs Immature Granulocytes: 0.01 10*3/uL (ref 0.00–0.07)
Basophils Absolute: 0 10*3/uL (ref 0.0–0.1)
Basophils Relative: 1 %
Eosinophils Absolute: 0.1 10*3/uL (ref 0.0–0.5)
Eosinophils Relative: 3 %
HCT: 36 % (ref 36.0–46.0)
Hemoglobin: 11.9 g/dL — ABNORMAL LOW (ref 12.0–15.0)
Immature Granulocytes: 0 %
Lymphocytes Relative: 30 %
Lymphs Abs: 0.9 10*3/uL (ref 0.7–4.0)
MCH: 34.8 pg — ABNORMAL HIGH (ref 26.0–34.0)
MCHC: 33.1 g/dL (ref 30.0–36.0)
MCV: 105.3 fL — ABNORMAL HIGH (ref 80.0–100.0)
Monocytes Absolute: 0.5 10*3/uL (ref 0.1–1.0)
Monocytes Relative: 18 %
Neutro Abs: 1.4 10*3/uL — ABNORMAL LOW (ref 1.7–7.7)
Neutrophils Relative %: 48 %
Platelet Count: 102 10*3/uL — ABNORMAL LOW (ref 150–400)
RBC: 3.42 MIL/uL — ABNORMAL LOW (ref 3.87–5.11)
RDW: 14.9 % (ref 11.5–15.5)
WBC Count: 2.9 10*3/uL — ABNORMAL LOW (ref 4.0–10.5)
nRBC: 0 % (ref 0.0–0.2)

## 2021-08-21 LAB — CMP (CANCER CENTER ONLY)
ALT: 11 U/L (ref 0–44)
AST: 10 U/L — ABNORMAL LOW (ref 15–41)
Albumin: 3.3 g/dL — ABNORMAL LOW (ref 3.5–5.0)
Alkaline Phosphatase: 102 U/L (ref 38–126)
Anion gap: 11 (ref 5–15)
BUN: 8 mg/dL (ref 8–23)
CO2: 18 mmol/L — ABNORMAL LOW (ref 22–32)
Calcium: 9.4 mg/dL (ref 8.9–10.3)
Chloride: 111 mmol/L (ref 98–111)
Creatinine: 1.13 mg/dL — ABNORMAL HIGH (ref 0.44–1.00)
GFR, Estimated: 50 mL/min — ABNORMAL LOW (ref >60)
Glucose, Bld: 206 mg/dL — ABNORMAL HIGH (ref 70–99)
Potassium: 3.7 mmol/L (ref 3.5–5.1)
Sodium: 140 mmol/L (ref 135–145)
Total Bilirubin: 0.7 mg/dL (ref 0.3–1.2)
Total Protein: 6 g/dL — ABNORMAL LOW (ref 6.5–8.1)

## 2021-08-21 MED ORDER — DIPHENHYDRAMINE HCL 25 MG PO TABS
25.0000 mg | ORAL_TABLET | Freq: Once | ORAL | Status: DC
  Filled 2021-08-21: qty 1

## 2021-08-21 MED ORDER — ZOLEDRONIC ACID 4 MG/100ML IV SOLN
4.0000 mg | Freq: Once | INTRAVENOUS | Status: AC
  Administered 2021-08-21: 4 mg via INTRAVENOUS
  Filled 2021-08-21: qty 100

## 2021-08-21 MED ORDER — FAMOTIDINE 20 MG PO TABS
20.0000 mg | ORAL_TABLET | Freq: Once | ORAL | Status: AC
  Administered 2021-08-21: 20 mg via ORAL
  Filled 2021-08-21: qty 1

## 2021-08-21 MED ORDER — SODIUM CHLORIDE 0.9 % IV SOLN
Freq: Once | INTRAVENOUS | Status: AC
Start: 2021-08-21 — End: 2021-08-21

## 2021-08-21 MED ORDER — SODIUM CHLORIDE 0.9% FLUSH
10.0000 mL | Freq: Once | INTRAVENOUS | Status: AC
Start: 2021-08-21 — End: 2021-08-21
  Administered 2021-08-21: 10 mL

## 2021-08-21 MED ORDER — DEXAMETHASONE 4 MG PO TABS
12.0000 mg | ORAL_TABLET | Freq: Once | ORAL | Status: AC
  Administered 2021-08-21: 12 mg via ORAL
  Filled 2021-08-21: qty 3

## 2021-08-21 MED ORDER — PROCHLORPERAZINE MALEATE 10 MG PO TABS
10.0000 mg | ORAL_TABLET | Freq: Once | ORAL | Status: AC
  Administered 2021-08-21: 10 mg via ORAL
  Filled 2021-08-21: qty 1

## 2021-08-21 MED ORDER — DEXTROSE 5 % IV SOLN
56.0000 mg/m2 | Freq: Once | INTRAVENOUS | Status: AC
  Administered 2021-08-21: 100 mg via INTRAVENOUS
  Filled 2021-08-21: qty 30

## 2021-08-21 MED ORDER — SODIUM CHLORIDE 0.9 % IV SOLN: Freq: Once | INTRAVENOUS | Status: AC

## 2021-08-21 MED ORDER — SODIUM CHLORIDE 0.9% FLUSH
10.0000 mL | INTRAVENOUS | Status: DC | PRN
  Administered 2021-08-21: 10 mL

## 2021-08-21 MED ORDER — HEPARIN SOD (PORK) LOCK FLUSH 100 UNIT/ML IV SOLN
500.0000 [IU] | Freq: Once | INTRAVENOUS | Status: AC | PRN
  Administered 2021-08-21: 500 [IU]

## 2021-08-21 MED ORDER — ACETAMINOPHEN 325 MG PO TABS
650.0000 mg | ORAL_TABLET | Freq: Once | ORAL | Status: AC
  Administered 2021-08-21: 650 mg via ORAL
  Filled 2021-08-21: qty 2

## 2021-08-21 NOTE — Patient Instructions (Signed)
[facility]  Discharge Instructions: Thank you for choosing [facility] to provide your oncology and hematology care.   If you have a lab appointment with the Cancer Center, please go directly to the Cancer Center and check in at the registration area.   Wear comfortable clothing and clothing appropriate for easy access to any Portacath or PICC line.   We strive to give you quality time with your provider. You may need to reschedule your appointment if you arrive late (15 or more minutes).  Arriving late affects you and other patients whose appointments are after yours.  Also, if you miss three or more appointments without notifying the office, you may be dismissed from the clinic at the provider's discretion.      For prescription refill requests, have your pharmacy contact our office and allow 72 hours for refills to be completed.    Today you received the following chemotherapy and/or immunotherapy agents: Kyprolis    To help prevent nausea and vomiting after your treatment, we encourage you to take your nausea medication as directed.  BELOW ARE SYMPTOMS THAT SHOULD BE REPORTED IMMEDIATELY: . *FEVER GREATER THAN 100.4 F (38 C) OR HIGHER . *CHILLS OR SWEATING . *NAUSEA AND VOMITING THAT IS NOT CONTROLLED WITH YOUR NAUSEA MEDICATION . *UNUSUAL SHORTNESS OF BREATH . *UNUSUAL BRUISING OR BLEEDING . *URINARY PROBLEMS (pain or burning when urinating, or frequent urination) . *BOWEL PROBLEMS (unusual diarrhea, constipation, pain near the anus) . TENDERNESS IN MOUTH AND THROAT WITH OR WITHOUT PRESENCE OF ULCERS (sore throat, sores in mouth, or a toothache) . UNUSUAL RASH, SWELLING OR PAIN  . UNUSUAL VAGINAL DISCHARGE OR ITCHING   Items with * indicate a potential emergency and should be followed up as soon as possible or go to the Emergency Department if any problems should occur.  Please show the CHEMOTHERAPY ALERT CARD or IMMUNOTHERAPY ALERT  CARD at check-in to the Emergency Department and triage nurse.  Should you have questions after your visit or need to cancel or reschedule your appointment, please contact [facility]  Dept: [phone]  and follow the prompts.  Office hours are 8:00 a.m. to 4:30 p.m. Monday - Friday. Please note that voicemails left after 4:00 p.m. may not be returned until the following business day.  We are closed weekends and major holidays. You have access to a nurse at all times for urgent questions. Please call the main number to the clinic Dept: [phone] and follow the prompts.   For any non-urgent questions, you may also contact your provider using MyChart. We now offer e-Visits for anyone 18 and older to request care online for non-urgent symptoms. For details visit [email or web address].   Also download the MyChart app! Go to the app store, search "MyChart", open the app, select [facility], and log in with your MyChart username and password.  Due to Covid, a mask is required upon entering the hospital/clinic. If you do not have a mask, one will be given to you upon arrival. For doctor visits, patients may have 1 support person aged 18 or older with them. For treatment visits, patients cannot have anyone with them due to current Covid guidelines and our immunocompromised population.   

## 2021-08-21 NOTE — Progress Notes (Signed)
Ok to treat per Dr.Kale with ANC of 1.4

## 2021-08-25 DIAGNOSIS — H353132 Nonexudative age-related macular degeneration, bilateral, intermediate dry stage: Secondary | ICD-10-CM | POA: Diagnosis not present

## 2021-08-25 DIAGNOSIS — H524 Presbyopia: Secondary | ICD-10-CM | POA: Diagnosis not present

## 2021-08-25 DIAGNOSIS — Z961 Presence of intraocular lens: Secondary | ICD-10-CM | POA: Diagnosis not present

## 2021-08-25 DIAGNOSIS — E119 Type 2 diabetes mellitus without complications: Secondary | ICD-10-CM | POA: Diagnosis not present

## 2021-08-25 DIAGNOSIS — H40013 Open angle with borderline findings, low risk, bilateral: Secondary | ICD-10-CM | POA: Diagnosis not present

## 2021-08-25 LAB — HM DIABETES EYE EXAM

## 2021-08-27 ENCOUNTER — Other Ambulatory Visit: Payer: Self-pay

## 2021-08-27 DIAGNOSIS — C9000 Multiple myeloma not having achieved remission: Secondary | ICD-10-CM

## 2021-08-28 ENCOUNTER — Inpatient Hospital Stay: Payer: Medicare PPO

## 2021-08-28 ENCOUNTER — Inpatient Hospital Stay (HOSPITAL_BASED_OUTPATIENT_CLINIC_OR_DEPARTMENT_OTHER): Payer: Medicare PPO | Admitting: Hematology

## 2021-08-28 ENCOUNTER — Other Ambulatory Visit: Payer: Self-pay

## 2021-08-28 VITALS — BP 130/63 | HR 67 | Temp 98.2°F | Resp 17 | Ht 59.0 in | Wt 154.7 lb

## 2021-08-28 DIAGNOSIS — Z7189 Other specified counseling: Secondary | ICD-10-CM

## 2021-08-28 DIAGNOSIS — C9000 Multiple myeloma not having achieved remission: Secondary | ICD-10-CM | POA: Diagnosis not present

## 2021-08-28 DIAGNOSIS — Z79899 Other long term (current) drug therapy: Secondary | ICD-10-CM | POA: Diagnosis not present

## 2021-08-28 DIAGNOSIS — Z95828 Presence of other vascular implants and grafts: Secondary | ICD-10-CM

## 2021-08-28 DIAGNOSIS — Z5111 Encounter for antineoplastic chemotherapy: Secondary | ICD-10-CM

## 2021-08-28 DIAGNOSIS — Z5112 Encounter for antineoplastic immunotherapy: Secondary | ICD-10-CM | POA: Diagnosis not present

## 2021-08-28 LAB — CMP (CANCER CENTER ONLY)
ALT: 14 U/L (ref 0–44)
AST: 13 U/L — ABNORMAL LOW (ref 15–41)
Albumin: 3.3 g/dL — ABNORMAL LOW (ref 3.5–5.0)
Alkaline Phosphatase: 94 U/L (ref 38–126)
Anion gap: 11 (ref 5–15)
BUN: 19 mg/dL (ref 8–23)
CO2: 19 mmol/L — ABNORMAL LOW (ref 22–32)
Calcium: 9.7 mg/dL (ref 8.9–10.3)
Chloride: 110 mmol/L (ref 98–111)
Creatinine: 1.13 mg/dL — ABNORMAL HIGH (ref 0.44–1.00)
GFR, Estimated: 50 mL/min — ABNORMAL LOW (ref >60)
Glucose, Bld: 164 mg/dL — ABNORMAL HIGH (ref 70–99)
Potassium: 3.6 mmol/L (ref 3.5–5.1)
Sodium: 140 mmol/L (ref 135–145)
Total Bilirubin: 0.8 mg/dL (ref 0.3–1.2)
Total Protein: 6.1 g/dL — ABNORMAL LOW (ref 6.5–8.1)

## 2021-08-28 LAB — CBC WITH DIFFERENTIAL (CANCER CENTER ONLY)
Abs Immature Granulocytes: 0.02 10*3/uL (ref 0.00–0.07)
Basophils Absolute: 0 10*3/uL (ref 0.0–0.1)
Basophils Relative: 1 %
Eosinophils Absolute: 0.1 10*3/uL (ref 0.0–0.5)
Eosinophils Relative: 2 %
HCT: 35 % — ABNORMAL LOW (ref 36.0–46.0)
Hemoglobin: 11.8 g/dL — ABNORMAL LOW (ref 12.0–15.0)
Immature Granulocytes: 1 %
Lymphocytes Relative: 25 %
Lymphs Abs: 0.7 10*3/uL (ref 0.7–4.0)
MCH: 35.1 pg — ABNORMAL HIGH (ref 26.0–34.0)
MCHC: 33.7 g/dL (ref 30.0–36.0)
MCV: 104.2 fL — ABNORMAL HIGH (ref 80.0–100.0)
Monocytes Absolute: 0.7 10*3/uL (ref 0.1–1.0)
Monocytes Relative: 24 %
Neutro Abs: 1.4 10*3/uL — ABNORMAL LOW (ref 1.7–7.7)
Neutrophils Relative %: 47 %
Platelet Count: 79 10*3/uL — ABNORMAL LOW (ref 150–400)
RBC: 3.36 MIL/uL — ABNORMAL LOW (ref 3.87–5.11)
RDW: 14.4 % (ref 11.5–15.5)
WBC Count: 2.9 10*3/uL — ABNORMAL LOW (ref 4.0–10.5)
nRBC: 0 % (ref 0.0–0.2)

## 2021-08-28 MED ORDER — DIPHENHYDRAMINE HCL 25 MG PO CAPS
25.0000 mg | ORAL_CAPSULE | Freq: Once | ORAL | Status: AC
  Administered 2021-08-28: 25 mg via ORAL
  Filled 2021-08-28: qty 1

## 2021-08-28 MED ORDER — DEXTROSE 5 % IV SOLN
56.0000 mg/m2 | Freq: Once | INTRAVENOUS | Status: AC
  Administered 2021-08-28: 100 mg via INTRAVENOUS
  Filled 2021-08-28: qty 30

## 2021-08-28 MED ORDER — HEPARIN SOD (PORK) LOCK FLUSH 100 UNIT/ML IV SOLN
500.0000 [IU] | Freq: Once | INTRAVENOUS | Status: AC | PRN
  Administered 2021-08-28: 500 [IU]

## 2021-08-28 MED ORDER — PROCHLORPERAZINE MALEATE 10 MG PO TABS
10.0000 mg | ORAL_TABLET | Freq: Once | ORAL | Status: AC
  Administered 2021-08-28: 10 mg via ORAL
  Filled 2021-08-28: qty 1

## 2021-08-28 MED ORDER — FAMOTIDINE 20 MG PO TABS
20.0000 mg | ORAL_TABLET | Freq: Once | ORAL | Status: AC
  Administered 2021-08-28: 20 mg via ORAL
  Filled 2021-08-28: qty 1

## 2021-08-28 MED ORDER — SODIUM CHLORIDE 0.9 % IV SOLN: Freq: Once | INTRAVENOUS | Status: AC

## 2021-08-28 MED ORDER — SODIUM CHLORIDE 0.9% FLUSH
10.0000 mL | INTRAVENOUS | Status: DC | PRN
  Administered 2021-08-28: 10 mL

## 2021-08-28 MED ORDER — ACETAMINOPHEN 325 MG PO TABS
650.0000 mg | ORAL_TABLET | Freq: Once | ORAL | Status: AC
  Administered 2021-08-28: 650 mg via ORAL
  Filled 2021-08-28: qty 2

## 2021-08-28 MED ORDER — DEXAMETHASONE 4 MG PO TABS
12.0000 mg | ORAL_TABLET | Freq: Once | ORAL | Status: AC
  Administered 2021-08-28: 12 mg via ORAL
  Filled 2021-08-28: qty 3

## 2021-08-28 MED ORDER — SODIUM CHLORIDE 0.9% FLUSH
10.0000 mL | Freq: Once | INTRAVENOUS | Status: AC
  Administered 2021-08-28: 10 mL

## 2021-08-28 NOTE — Progress Notes (Signed)
Ok to treat with platelets of 79 and ANC of 1.4 per Dr.Kale

## 2021-08-28 NOTE — Patient Instructions (Signed)
[facility]  Discharge Instructions: Thank you for choosing [facility] to provide your oncology and hematology care.   If you have a lab appointment with the Cancer Center, please go directly to the Cancer Center and check in at the registration area.   Wear comfortable clothing and clothing appropriate for easy access to any Portacath or PICC line.   We strive to give you quality time with your provider. You may need to reschedule your appointment if you arrive late (15 or more minutes).  Arriving late affects you and other patients whose appointments are after yours.  Also, if you miss three or more appointments without notifying the office, you may be dismissed from the clinic at the provider's discretion.      For prescription refill requests, have your pharmacy contact our office and allow 72 hours for refills to be completed.    Today you received the following chemotherapy and/or immunotherapy agents: Kyprolis    To help prevent nausea and vomiting after your treatment, we encourage you to take your nausea medication as directed.  BELOW ARE SYMPTOMS THAT SHOULD BE REPORTED IMMEDIATELY: . *FEVER GREATER THAN 100.4 F (38 C) OR HIGHER . *CHILLS OR SWEATING . *NAUSEA AND VOMITING THAT IS NOT CONTROLLED WITH YOUR NAUSEA MEDICATION . *UNUSUAL SHORTNESS OF BREATH . *UNUSUAL BRUISING OR BLEEDING . *URINARY PROBLEMS (pain or burning when urinating, or frequent urination) . *BOWEL PROBLEMS (unusual diarrhea, constipation, pain near the anus) . TENDERNESS IN MOUTH AND THROAT WITH OR WITHOUT PRESENCE OF ULCERS (sore throat, sores in mouth, or a toothache) . UNUSUAL RASH, SWELLING OR PAIN  . UNUSUAL VAGINAL DISCHARGE OR ITCHING   Items with * indicate a potential emergency and should be followed up as soon as possible or go to the Emergency Department if any problems should occur.  Please show the CHEMOTHERAPY ALERT CARD or IMMUNOTHERAPY ALERT  CARD at check-in to the Emergency Department and triage nurse.  Should you have questions after your visit or need to cancel or reschedule your appointment, please contact [facility]  Dept: [phone]  and follow the prompts.  Office hours are 8:00 a.m. to 4:30 p.m. Monday - Friday. Please note that voicemails left after 4:00 p.m. may not be returned until the following business day.  We are closed weekends and major holidays. You have access to a nurse at all times for urgent questions. Please call the main number to the clinic Dept: [phone] and follow the prompts.   For any non-urgent questions, you may also contact your provider using MyChart. We now offer e-Visits for anyone 18 and older to request care online for non-urgent symptoms. For details visit [email or web address].   Also download the MyChart app! Go to the app store, search "MyChart", open the app, select [facility], and log in with your MyChart username and password.  Due to Covid, a mask is required upon entering the hospital/clinic. If you do not have a mask, one will be given to you upon arrival. For doctor visits, patients may have 1 support person aged 18 or older with them. For treatment visits, patients cannot have anyone with them due to current Covid guidelines and our immunocompromised population.   

## 2021-09-03 ENCOUNTER — Encounter: Payer: Self-pay | Admitting: Hematology

## 2021-09-03 NOTE — Progress Notes (Signed)
HEMATOLOGY/ONCOLOGY CLINIC NOTE  Date of Service: .08/28/2021  Patient Care Team: Hoyt Koch, MD as PCP - General (Internal Medicine) Lafayette Dragon, MD (Inactive) as Consulting Physician (Gastroenterology) Megan Salon, MD as Consulting Physician (Gynecology) Melrose Nakayama, MD as Consulting Physician (Orthopedic Surgery) Deneise Lever, MD as Consulting Physician (Pulmonary Disease) [name], MD (Ophthalmology)  CHIEF COMPLAINTS/PURPOSE OF CONSULTATION:  Continue mx of myeloma  HISTORY OF PRESENTING ILLNESS:   Faith Orr is a wonderful 80 y.o. female who has been referred to [name] by Dr. Pricilla Holm for evaluation and management of Lytic bone lesions. She is accompanied today by her son in law, and her partner is present via phone. The pt reports that she is doing well overall.   The pt notes that 2-3 months ago while standing at the stove, and otherwise feeling normally, she felt "something snap that took her breath away" in her mid back as she stretched to get something. The pt notes that she saw a chiropractor twice due to her back stiffness, which did not help. The pt then described her new bone pains to her PCP on 12/05/18, and subsequent imaging, as noted below, revealed concerns for numerous bone lesions. She has begun 37m Fosamax. The pt notes that she had hip pain in 2018, and that an XR at that time did not reveal any lesions.  The pt notes that most of her pain is concentrated to her left shoulder presently, and with minimal movement of the arm. She endorses pain radiating into her left arm and notes that her hand is swollen in the mornings when she wakes up. She also endorses present back pain and right hip pain, worse when she walks. She has not yet seen orthopedics. The pt reports that she is needing to take 6048mAdvil every 4-6 hours as Tramadol alone has not been able to alleviate her pain. The pt denies any unexpected weight loss,  fevers, chills, or night sweats. The pt notes that her urine is a very dark color presently, but denies overt blood in the urine, underpants, nor tissue paper. The pt notes that in the last two weeks she has had some soreness in her head, but denies new headaches or changes in vision. The pt notes that she has been urinating more frequently overall, but has been trying to stay better hydrated as well.  The pt notes that she has been compliant with annual mammograms.   The pt notes that she had a cyst in her right breast which was removed in the past. She fractured her left wrist in 2008 after falling down stairs. The pt endorses history of fatty liver.   The pt denies ever smoking cigarettes and endorses significant second hand smoke exposure with a previous marriage.   Of note prior to the patient's visit today, pt has had a Bone Scan completed on 12/13/18 with results revealing Multifocal uptake throughout the skeleton, consistent with diffuse metastatic disease. Primary tumor is not specified. 2. Uptake in the proximal right femur, consistent with lytic lesions. 3. Uptake in the ribs bilaterally as described. 4. Lesions in the proximal left humerus. 5. Diffuse uptake throughout the skull consistent with metastatic disease. 6. Right paramedian uptake at the manubrium.  Most recent lab results (12/08/18) of CBC w/diff and CMP is as follows: all values are WNL except for Glucose at 279, BUN at 24, AST at 41, ALT at 46. 12/08/18 SPEP revealed all values WNL except for Total  Protein at 6.0, Albumin at 3.6, Gamma globulin at 0.7, and M spike at 0.5g  On review of systems, pt reports significant left shoulder pain, back pain, right hip pain, dark urine, and denies fevers, chills, night sweats, unexpected weight loss, changes in bowel habits, changes in breathing, cough, new respiratory symptoms, changes in vision, abdominal pains, leg swelling, and any other symptoms.   On PMHx the pt reports fatty liver,  and denies blood clots.  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 Denies ever smoking.  On Family Hx the pt reports maternal grandmother with colon cancer. Father with bladder cancer and amyloidosis (pt notes that this could have been misdiagnosis). Mother with Protein S deficiency and polymyalgia rheumatica.  Current Treatment: Carfilzomib + Revlimid maintenance.  INTERVAL HISTORY:   Faith Orr returns today for management and evaluation of her Multiple Myeloma. The patient's last visit with [name] was on 07/03/2021.  The pt reports that she is doing well overall.  The pt reports no acute new symptoms. No infection issues. No new treatment toxicities. No new bone pains  Lab results today 08/28/2021 of CBC w/diff and CMP results reviewed.  CBC shows mild leukopenia WBC count 2.9k with ANC of 1400 hemoglobin of 11.8 and a platelet count of 79k Her Revlimid has already been reduced to 10 mg 3 weeks on 1 week off.  On review of systems, pt reports no new bone pains and denies fevers/chills/nightsweats and any other symptoms.   MEDICAL HISTORY:  Past Medical History:  Diagnosis Date   Allergy    seasonal   Asthma    DEPRESSION    DIABETES MELLITUS, TYPE II    Diverticulosis    HYPERLIPIDEMIA    Macular degeneration of left eye    mild, Dr.Hecker   Obesity, unspecified    Osteoarthritis of both knees    OSTEOPENIA    Osteopenia    URINARY INCONTINENCE     SURGICAL HISTORY: Past Surgical History:  Procedure Laterality Date   CATARACT EXTRACTION Left 05/24/2018   CESAREAN SECTION  01/1973   CYSTOSCOPY/URETEROSCOPY/HOLMIUM LASER/STENT PLACEMENT Right 09/20/2019   Procedure: CYSTOSCOPY/URETEROSCOPY/HOLMIUM LASER/STENT PLACEMENT;  Surgeon: Lucas Mallow, MD;  Location: [facility];  Service: Urology;  Laterality: Right;   FRACTURE SURGERY     IR IMAGING GUIDED PORT INSERTION  02/20/2019   left wrist surgery  2008    By Dr. Latanya Maudlin   right ankle  1994    SOCIAL HISTORY: Social History   Socioeconomic History   Marital status: Married    Spouse name: Not on file   Number of children: 1   Years of education: Not on file   Highest education level: Not on file  Occupational History    Employer: [employer]  Tobacco Use   Smoking status: Never   Smokeless tobacco: Never   Tobacco comments:    Lives with partner [name]) and son  Vaping Use   Vaping Use: Never used  Substance and Sexual Activity   Alcohol use: No    Alcohol/week: 0.0 standard drinks   Drug use: No   Sexual activity: Never    Partners: Female    Birth control/protection: Post-menopausal    Comment: Lives with female partner (annette hicks) and 66 yo son  Other Topics Concern   Not on file  Social History Narrative   Not on file   Social Determinants of Health   Financial  Resource Strain: Not on file  Food Insecurity: Not on file  Transportation Needs: Not on file  Physical Activity: Not on file  Stress: Not on file  Social Connections: Not on file  Intimate Partner Violence: Not At Risk   Fear of Current or Ex-Partner: No   Emotionally Abused: No   Physically Abused: No   Sexually Abused: No    FAMILY HISTORY: Family History  Problem Relation Age of Onset   Diabetes Father    Hyperlipidemia Father    Heart disease Father    Cancer Father    Hypertension Father    Colon cancer Paternal Grandmother 55   Osteoporosis Mother    Protein S deficiency Mother    Hyperlipidemia Mother    Multiple sclerosis Daughter    Cancer Other        bladder   Breast cancer Neg Hx     ALLERGIES:  is allergic to penicillins, aleve [naproxen sodium], and sulfonamide derivatives.  MEDICATIONS:  Current Outpatient Medications  Medication Sig Dispense Refill   acyclovir (ZOVIRAX) 400 MG tablet TAKE 1 TABLET(400 MG) BY MOUTH TWICE DAILY 60 tablet 5   apixaban (ELIQUIS) 2.5 MG TABS tablet TAKE 1  TABLET(2.5 MG) BY MOUTH TWICE DAILY 60 tablet 2   Blood Glucose Monitoring Suppl (FREESTYLE FREEDOM LITE) W/DEVICE KIT Use to check blood sugars twice a day Dx 250.00 1 each 0   Calcium Carbonate-Vitamin D 600-400 MG-UNIT tablet Take 1 tablet by mouth 2 (two) times daily.     Cetirizine HCl 10 MG CAPS Take 1 capsule (10 mg total) by mouth daily. 30 capsule 1   clindamycin (CLEOCIN) 300 MG capsule Take 2 capsules (600 mg total) by mouth once as needed for up to 1 dose (1 hour prior to dental procedures). 2 capsule 0   dexamethasone (DECADRON) 4 MG tablet Take 5 tablets (20 mg total) by mouth once a week. On D22 of each cycle of treatment 20 tablet 5   fentaNYL (DURAGESIC) 12 MCG/HR Place 1 patch onto the skin every 3 (three) days. 10 patch 0   fluticasone (FLONASE) 50 MCG/ACT nasal spray Place 1 spray into both nostrils daily. 16 g 2   glipiZIDE (GLUCOTROL XL) 5 MG 24 hr tablet TAKE 1 TABLET(5 MG) BY MOUTH DAILY WITH BREAKFAST 90 tablet 1   glucose blood (FREESTYLE LITE) test strip CHECK BLOOD SUGAR TWICE DAILY AS DIRECTED Dx 250.00 180 each 3   Lancets (FREESTYLE) lancets Use twice daily to check sugars. 100 each 11   lenalidomide (REVLIMID) 10 MG capsule Take 1 capsule (10 mg total) by mouth daily. [employer] Auth # [id number]     Date Obtained 08/15/21 21 capsule 0   lidocaine-prilocaine (EMLA) cream APPLY 1 APPLICATION TO THE AFFECTED AREA AS NEEDED. USE PRIOR TO PORT ACCESS 30 g 0   metFORMIN (GLUCOPHAGE-XR) 500 MG 24 hr tablet TAKE 3 TABLETS(1500 MG) BY MOUTH DAILY WITH BREAKFAST (Patient taking differently: Take 500 mg by mouth. 1 tablet daily) 270 tablet 1   Multiple Vitamins-Minerals (ICAPS) CAPS Take 1 capsule by mouth daily after breakfast.     ondansetron (ZOFRAN) 8 MG tablet Take 1 tablet (8 mg total) by mouth 2 (two) times daily as needed (Nausea or vomiting). 30 tablet 1   Oxycodone HCl 10 MG TABS Take 1 tablet (10 mg total) by mouth every 6 (six) hours as needed. 90 tablet 0    pantoprazole (PROTONIX) 20 MG tablet TAKE 1 TABLET(20 MG) BY MOUTH DAILY 30 tablet 5  polyethylene glycol (MIRALAX / GLYCOLAX) packet Take 17 g by mouth daily after breakfast.     potassium chloride SA (KLOR-CON) 20 MEQ tablet Take 2 tablets (40 mEq total) by mouth 2 (two) times daily. TAKE 2 TABLET(20 MEQ) BY MOUTH TWICE DAILY 360 tablet 1   prochlorperazine (COMPAZINE) 10 MG tablet Take 1 tablet (10 mg total) by mouth every 6 (six) hours as needed (Nausea or vomiting). 30 tablet 1   REVLIMID 10 MG capsule TAKE 1 CAPSULE BY MOUTH  DAILY FOR 21 DAYS ON, THEN  7 DAYS OFF 21 capsule 2   senna-docusate (SENNA S) 8.6-50 MG tablet Take 2 tablets by mouth at bedtime. 60 tablet 2   sertraline (ZOLOFT) 50 MG tablet Take 1 tablet (50 mg total) by mouth daily. 90 tablet 3   simvastatin (ZOCOR) 20 MG tablet TAKE 1 TABLET BY MOUTH EVERY DAY AT 6 PM 90 tablet 3   Triamcinolone Acetonide 0.025 % LOTN Apply 1 application topically 3 (three) times daily as needed (rash/itching). 60 mL 2   Vitamin D, Ergocalciferol, (DRISDOL) 1.25 MG (50000 UNIT) CAPS capsule Take 1 capsule (50,000 Units total) by mouth every 7 (seven) days. 12 capsule 0   No current facility-administered medications for this visit.   Facility-Administered Medications Ordered in Other Visits  Medication Dose Route Frequency Provider Last Rate Last Admin   heparin lock flush 100 unit/mL  500 Units Intracatheter Once PRN Brunetta Genera, MD       sodium chloride flush (NS) 0.9 % injection 10 mL  10 mL Intracatheter PRN Brunetta Genera, MD   10 mL at 10/02/19 1524    REVIEW OF SYSTEMS:   .10 Point review of Systems was done is negative except as noted above.   PHYSICAL EXAMINATION: ECOG FS:2 - Symptomatic, <50% confined to bed  Vitals:   08/28/21 0930  BP: 130/63  Pulse: 67  Resp: 17  Temp: 98.2 F (36.8 C)  SpO2: 100%    Wt Readings from Last 3 Encounters:  08/28/21 154 lb 11.2 oz (70.2 kg)  08/21/21 152 lb 12.8 oz  (69.3 kg)  07/31/21 155 lb 8 oz (70.5 kg)   Body mass index is 31.25 kg/m.   Marland Kitchen GENERAL:alert, in no acute distress and comfortable SKIN: no acute rashes, no significant lesions EYES: conjunctiva are pink and non-injected, sclera anicteric OROPHARYNX: MMM, no exudates, no oropharyngeal erythema or ulceration NECK: supple, no JVD LYMPH:  no palpable lymphadenopathy in the cervical, axillary or inguinal regions LUNGS: clear to auscultation b/l with normal respiratory effort HEART: regular rate & rhythm ABDOMEN:  normoactive bowel sounds , non tender, not distended. Extremity: no pedal edema PSYCH: alert & oriented x 3 with fluent speech NEURO: no focal motor/sensory deficits    LABORATORY DATA:  I have reviewed the data as listed  . CBC Latest Ref Rng & Units 08/28/2021 08/21/2021 07/31/2021  WBC 4.0 - 10.5 K/uL 2.9(L) 2.9(L) 2.5(L)  Hemoglobin 12.0 - 15.0 g/dL 11.8(L) 11.9(L) 11.2(L)  Hematocrit 36.0 - 46.0 % 35.0(L) 36.0 32.8(L)  Platelets 150 - 400 K/uL 79(L) 102(L) 77(L)   . CBC    Component Value Date/Time   WBC 2.9 (L) 08/28/2021 0907   WBC 3.5 (L) 05/30/2021 1030   RBC 3.36 (L) 08/28/2021 0907   HGB 11.8 (L) 08/28/2021 0907   HCT 35.0 (L) 08/28/2021 0907   PLT 79 (L) 08/28/2021 0907   MCV 104.2 (H) 08/28/2021 0907   MCH 35.1 (H) 08/28/2021 0907   MCHC  33.7 08/28/2021 0907   RDW 14.4 08/28/2021 0907   LYMPHSABS 0.7 08/28/2021 0907   MONOABS 0.7 08/28/2021 0907   EOSABS 0.1 08/28/2021 0907   BASOSABS 0.0 08/28/2021 0907    . CMP Latest Ref Rng & Units 08/28/2021 08/21/2021 07/31/2021  Glucose 70 - 99 mg/dL 164(H) 206(H) 158(H)  BUN 8 - 23 mg/dL _0 Creatinine 0.44 - 1.00 mg/dL 1.13(H) 1.13(H) 1.37(H)  Sodium 135 - 145 mmol/L 140 140 137  Potassium 3.5 - 5.1 mmol/L 3.6 3.7 3.6  Chloride 98 - 111 mmol/L 110 111 108  CO2 22 - 32 mmol/L 19(L) 18(L) 19(L)  Calcium 8.9 - 10.3 mg/dL 9.7 9.4 9.2  Total Protein 6.5 - 8.1 g/dL 6.1(L) 6.0(L) 5.9(L)  Total Bilirubin  0.3 - 1.2 mg/dL 0.8 0.7 0.5  Alkaline Phos 38 - 126 U/L 94 102 85  AST 15 - 41 U/L 13(L) 10(L) 9(L)  ALT 0 - 44 U/L _1 09/18/2019 BM Bx Report ([id number])   09/18/2019 FISH Panel    05/30/2019 BM Bx   01/06/2019 BM Bx:     01/06/19 Cytogenetics:      05/30/19 BM Biopsy:   09/18/2019 FISH Panel    09/18/2019 BM Surgical Pathology ([id number])     RADIOGRAPHIC STUDIES: I have personally reviewed the radiological images as listed and agreed with the findings in the report. No results found.   ASSESSMENT & PLAN:   80 y.o. female with  1. Recently diagnosed Multiple Myeloma, RISS Stage III  Labs upon initial presentation from 12/08/18, blood counts are normal including WBC at 7.1k, HGB at 13.1, and PLT at 245k. Calcium normal at 10.3. Creatinine normal at 0.63. M spike at 0.5g. 12/13/18 Bone Scan revealed Multifocal uptake throughout the skeleton, consistent with diffuse metastatic disease. Primary tumor is not specified. 2. Uptake in the proximal right femur, consistent with lytic lesions. 3. Uptake in the ribs bilaterally as described. 4. Lesions in the proximal left humerus. 5. Diffuse uptake throughout the skull consistent with metastatic disease. 6. Right paramedian uptake at the manubrium.  12/13/18 CT Right Femur revealed Numerous lytic lesions involving the right femur and a lytic lesion in the left inferior pubic ramus. Overall appearance is most concerning for multiple myeloma  12/27/18 Pretreatment 24hour UPEP observed an M spike at 77m, and showed 1965mtotal protein/day.  12/27/18 Pretreatment MMP revealed M Protein at 0.5g with IgG Lambda specificity. Kappa:Lambda light chain ratio at 0.13, with Lambda at 40.3. There is less abnormal protein and light chains than I would expect from 30% plasma cells, which suggests hypo-secretory or non-secretory neoplastic plasma cells. Will have an impact in assessing response. 01/05/19 PET/CT revealed  Innumerable lytic lesions in the skeleton compatible with myeloma. Most of the larger lesions are hypermetabolic, for example including a left proximal humeral shaft lesion with maximum SUV of 8.1 and a 2.8 cm lesion in the left T9 vertebral body with maximum SUV 5.1. Most of the smaller lytic lesions, and some of the larger lesions, do not demonstrate accentuated metabolic activity. 2. 1.2 cm in short axis lymph node in the left parapharyngeal space is hypermetabolic with maximum SUV 11.8. I do not see a separate mass in the head and neck to give rise to this hypermetabolic lymph node. 3. Mosaic attenuation in the lower lobes, nonspecific possibly from air trapping. 4.  Aortic Atherosclerosis 5. Heterogeneous activity in the liver, making it hard to exclude small liver lesions. Consider hepatic protocol MRI  with and without contrast for definitive assessment. Nonobstructive right nephrolithiasis. Old granulomatous disease  01/06/19 Bone Marrow biopsy revealed interstitial increase in plasma cells (28% aspirate, 40% CD138 immunohistochemistry). Plasma cells negative for light chains consistent with a non or weakly secretory myeloma   01/06/19 Cytogenetics revealed 37% of cells with trisomy 11 or 11q deletion, and 40.5% of cells with 17p mutation  S/p 5 cycles of KRD treatment  05/31/19 BM Biopsy revealed mild atypical plasmacytosis at 5% with polytypic variation.   06/01/19 PET/CT revealed "Dominant lesion in the LEFT humerus is decreased significantly in metabolic activity. Additional hypermetabolic skeletal lytic lesions have decreased in metabolic activity or similar to comparison exam (01/05/2019). No evidence of disease progression. 2. Multiple additional lytic lesions do not have metabolic activity and unchanged. 3. No new skeletal lesions are identified. No soft tissue plasmacytoma identified. 4. Nodule / node in the LEFT parapharyngeal space which is intensely hypermetabolic not changed from prior. 5.  New hypermetabolic LEFT lower lobe pulmonary nodule is indeterminate. Recommend close attention on follow-up 6. New obstructive hydronephrosis of the RIGHT kidney related to RIGHT UPJ stone."  09/18/2019 BM Bx Report which revealed "Slightly hypercellular bone marrow for age with trilineage hematopoiesis and 1% plasma cells."  09/14/2019 PET/CT Whole Body Scan ([id number]) which revealed "1. There widespread tiny lytic lesions compatible with multiple myeloma. Index larger lesions are generally similar to the prior exam, with low-grade activity such as the left T9 vertebral body lesion with maximum SUV 4.5. Is mild increase in the activity associated with a mildly sclerotic left proximal humeral lesion, maximum SUV 4.8 (previously 3.5). 2. At the site of the prior left lower lobe nodule is currently more bandlike thickening, with maximum SUV only 1.9, probably benign, continued surveillance of this region suggested. 3. There several small but hypermetabolic lymph nodes. This includes a left parapharyngeal space node measuring 1.0 cm with maximum SUV 12.3 (stable); a left level IB lymph node measuring 0.5 cm with maximum SUV 4.8 (slightly larger than prior); and a left inguinal lymph node measuring 0.7 cm in short axis with maximum SUV 6.4 (previously 0.5 cm with maximum SUV 0.6). Significance of these lymph nodes uncertain, surveillance is recommended. 4. New 5 mm left lower lobe subpleural nodule on image 32/8, not appreciably hypermetabolic, surveillance suggested. 5. Focal subcutaneous stranding along the left perineum measuring about 2.6 by 1.1 cm on image 221/4, maximum SUV 12.5. This was not present previously and is most likely inflammatory, although given the notable SUV, surveillance of this region is suggested. 6. Other imaging findings of potential clinical significance: Aortic Atherosclerosis (ICD10-I70.0). Coronary atherosclerosis. Old granulomatous disease. Mild right hydronephrosis due to a 7 mm  right UPJ calculus. 2 mm right kidney upper pole nonobstructive renal calculus. Prominent stool throughout the colon favors constipation."  12/19/2019 Thoracic & Lumbar Spine MRI ([id number]) ([id number]) revealed "Suspected myeloma lesions at T9 and S1. No compression deformity or epidural disease.  01/18/2020 PET/CT ([id number]) which revealed "1. Stable lytic lesions throughout the skeleton. The larger lytic lesions which had mild metabolic activity on comparison exam now have background metabolic activity. No evidence of active myeloma. No evidence of progression multiple myeloma.  No plasmacytoma 3. Hypermetabolic nodules in the LEFT neck may be associated deep tissues of the LEFT parotid gland. 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  01/17/19 MRI Liver  revealed Several appreciable liver lesions all have benign imaging characteristics. No MRI findings of metastatic involvement of the liver. 2. Scattered bony lesions corresponding to the lytic lesions seen at PET-CT, compatible with active myeloma. 3. Aortic Atherosclerosis.  Mild cardiomegaly. 4. Diffuse hepatic steatosis.   3. Left lower lobe pulmonary nodule First seen on 06/01/19 PET/CT PET/CT 2/44/0102: No hypermetabolic mediastinal or hilar nodes. No suspicious pulmonary nodules on the CT scan.  4. 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 Being managed conservatively as per patient's preference.  No symptoms from this currently.  PLAN: -Discussed pt labwork  today, 08/28/2021; CBC with leukopenia WBC count 2.9k with an ANC of 1400 and platelets of 79k -Her Revlimid has been reduced to 10 mg 3 weeks on 1 week off for maintenance in the context of  leukopenia and some thrombocytopenia. -The pt has no prohibitive toxicities from continuing Carfilzomib & Revlimid at this time. -Will continue Carflizomib at 56 mg/m^2 q2weeks for maintenance. -if progressive thrombocytopenia or leukopenia will need to lower dose of Revlimid further or reduce the dose of carfilzomib as well. -Continue 2.5 Eliquis BID for VTE prophylaxis -Continue Potassium daily.  -Continue Zometa q4weeks -Will see back in 2 months with labs.   FOLLOW UP: Please schedule next 3 cycles [6 doses] of carfilzomib with port flush and labs. PET CT scan in 7 weeks CT bone marrow aspiration biopsy in 7 weeks MD visit in 8 weeks   All of the patient's questions were answered with apparent satisfaction. The patient knows to call the clinic with any problems, questions or concerns.  . The total time spent in the appointment was 30 minutes and more than 50% was on counseling and direct patient cares, ordering and management of myeloma treatment.   Sullivan Lone MD Gerty AAHIVMS [facility] Hematology/Oncology Physician [facility]

## 2021-09-10 ENCOUNTER — Other Ambulatory Visit: Payer: Self-pay | Admitting: Hematology

## 2021-09-11 ENCOUNTER — Encounter: Payer: Self-pay | Admitting: Hematology

## 2021-09-11 ENCOUNTER — Other Ambulatory Visit: Payer: Self-pay

## 2021-09-11 DIAGNOSIS — C9000 Multiple myeloma not having achieved remission: Secondary | ICD-10-CM

## 2021-09-11 MED ORDER — LENALIDOMIDE 10 MG PO CAPS: ORAL_CAPSULE | ORAL | 2 refills | Status: DC

## 2021-09-17 ENCOUNTER — Other Ambulatory Visit: Payer: Self-pay | Admitting: Hematology

## 2021-09-17 DIAGNOSIS — C9000 Multiple myeloma not having achieved remission: Secondary | ICD-10-CM

## 2021-09-17 DIAGNOSIS — C7951 Secondary malignant neoplasm of bone: Secondary | ICD-10-CM

## 2021-09-18 ENCOUNTER — Inpatient Hospital Stay: Payer: Medicare PPO

## 2021-09-18 ENCOUNTER — Ambulatory Visit: Payer: Medicare PPO

## 2021-09-18 ENCOUNTER — Inpatient Hospital Stay: Payer: Medicare PPO | Attending: Hematology

## 2021-09-18 ENCOUNTER — Other Ambulatory Visit: Payer: Self-pay

## 2021-09-18 ENCOUNTER — Other Ambulatory Visit: Payer: Medicare PPO

## 2021-09-18 VITALS — BP 128/68 | HR 73 | Temp 98.7°F | Resp 16 | Wt 154.8 lb

## 2021-09-18 DIAGNOSIS — Z95828 Presence of other vascular implants and grafts: Secondary | ICD-10-CM

## 2021-09-18 DIAGNOSIS — C9000 Multiple myeloma not having achieved remission: Secondary | ICD-10-CM | POA: Insufficient documentation

## 2021-09-18 DIAGNOSIS — Z23 Encounter for immunization: Secondary | ICD-10-CM

## 2021-09-18 DIAGNOSIS — Z5112 Encounter for antineoplastic immunotherapy: Secondary | ICD-10-CM | POA: Insufficient documentation

## 2021-09-18 DIAGNOSIS — Z7189 Other specified counseling: Secondary | ICD-10-CM

## 2021-09-18 LAB — CMP (CANCER CENTER ONLY)
ALT: 9 U/L (ref 0–44)
AST: 10 U/L — ABNORMAL LOW (ref 15–41)
Albumin: 3.2 g/dL — ABNORMAL LOW (ref 3.5–5.0)
Alkaline Phosphatase: 96 U/L (ref 38–126)
Anion gap: 9 (ref 5–15)
BUN: 12 mg/dL (ref 8–23)
CO2: 20 mmol/L — ABNORMAL LOW (ref 22–32)
Calcium: 8.8 mg/dL — ABNORMAL LOW (ref 8.9–10.3)
Chloride: 111 mmol/L (ref 98–111)
Creatinine: 1.11 mg/dL — ABNORMAL HIGH (ref 0.44–1.00)
GFR, Estimated: 51 mL/min — ABNORMAL LOW (ref >60)
Glucose, Bld: 212 mg/dL — ABNORMAL HIGH (ref 70–99)
Potassium: 3.4 mmol/L — ABNORMAL LOW (ref 3.5–5.1)
Sodium: 140 mmol/L (ref 135–145)
Total Bilirubin: 0.7 mg/dL (ref 0.3–1.2)
Total Protein: 5.6 g/dL — ABNORMAL LOW (ref 6.5–8.1)

## 2021-09-18 LAB — CBC WITH DIFFERENTIAL (CANCER CENTER ONLY)
Abs Immature Granulocytes: 0.01 10*3/uL (ref 0.00–0.07)
Basophils Absolute: 0 10*3/uL (ref 0.0–0.1)
Basophils Relative: 1 %
Eosinophils Absolute: 0.1 10*3/uL (ref 0.0–0.5)
Eosinophils Relative: 2 %
HCT: 33.7 % — ABNORMAL LOW (ref 36.0–46.0)
Hemoglobin: 11.3 g/dL — ABNORMAL LOW (ref 12.0–15.0)
Immature Granulocytes: 0 %
Lymphocytes Relative: 27 %
Lymphs Abs: 0.7 10*3/uL (ref 0.7–4.0)
MCH: 35.2 pg — ABNORMAL HIGH (ref 26.0–34.0)
MCHC: 33.5 g/dL (ref 30.0–36.0)
MCV: 105 fL — ABNORMAL HIGH (ref 80.0–100.0)
Monocytes Absolute: 0.5 10*3/uL (ref 0.1–1.0)
Monocytes Relative: 20 %
Neutro Abs: 1.4 10*3/uL — ABNORMAL LOW (ref 1.7–7.7)
Neutrophils Relative %: 50 %
Platelet Count: 93 10*3/uL — ABNORMAL LOW (ref 150–400)
RBC: 3.21 MIL/uL — ABNORMAL LOW (ref 3.87–5.11)
RDW: 14.4 % (ref 11.5–15.5)
WBC Count: 2.8 10*3/uL — ABNORMAL LOW (ref 4.0–10.5)
nRBC: 0 % (ref 0.0–0.2)

## 2021-09-18 MED ORDER — DEXAMETHASONE 4 MG PO TABS
12.0000 mg | ORAL_TABLET | Freq: Once | ORAL | Status: AC
  Administered 2021-09-18: 12 mg via ORAL
  Filled 2021-09-18: qty 3

## 2021-09-18 MED ORDER — FAMOTIDINE 20 MG PO TABS
20.0000 mg | ORAL_TABLET | Freq: Once | ORAL | Status: AC
  Administered 2021-09-18: 20 mg via ORAL
  Filled 2021-09-18: qty 1

## 2021-09-18 MED ORDER — SODIUM CHLORIDE 0.9% FLUSH
10.0000 mL | Freq: Once | INTRAVENOUS | Status: AC
  Administered 2021-09-18: 10 mL

## 2021-09-18 MED ORDER — DIPHENHYDRAMINE HCL 25 MG PO CAPS
ORAL_CAPSULE | ORAL | Status: AC
  Administered 2021-09-18: 25 mg via ORAL
  Filled 2021-09-18: qty 1

## 2021-09-18 MED ORDER — DIPHENHYDRAMINE HCL 25 MG PO CAPS
25.0000 mg | ORAL_CAPSULE | Freq: Once | ORAL | Status: AC
  Filled 2021-09-18: qty 1

## 2021-09-18 MED ORDER — SODIUM CHLORIDE 0.9% FLUSH
10.0000 mL | INTRAVENOUS | Status: DC | PRN
  Administered 2021-09-18: 10 mL

## 2021-09-18 MED ORDER — INFLUENZA VAC A&B SA ADJ QUAD 0.5 ML IM PRSY
0.5000 mL | PREFILLED_SYRINGE | Freq: Once | INTRAMUSCULAR | Status: AC
  Administered 2021-09-18: 0.5 mL via INTRAMUSCULAR
  Filled 2021-09-18: qty 0.5

## 2021-09-18 MED ORDER — DEXTROSE 5 % IV SOLN
56.0000 mg/m2 | Freq: Once | INTRAVENOUS | Status: AC
  Administered 2021-09-18: 100 mg via INTRAVENOUS
  Filled 2021-09-18: qty 30

## 2021-09-18 MED ORDER — ACETAMINOPHEN 325 MG PO TABS
650.0000 mg | ORAL_TABLET | Freq: Once | ORAL | Status: AC
  Administered 2021-09-18: 650 mg via ORAL
  Filled 2021-09-18: qty 2

## 2021-09-18 MED ORDER — HEPARIN SOD (PORK) LOCK FLUSH 100 UNIT/ML IV SOLN
500.0000 [IU] | Freq: Once | INTRAVENOUS | Status: AC | PRN
Start: 2021-09-18 — End: 2021-09-18
  Administered 2021-09-18: 500 [IU]

## 2021-09-18 MED ORDER — SODIUM CHLORIDE 0.9 % IV SOLN: Freq: Once | INTRAVENOUS | Status: AC

## 2021-09-18 MED ORDER — PROCHLORPERAZINE MALEATE 10 MG PO TABS
10.0000 mg | ORAL_TABLET | Freq: Once | ORAL | Status: AC
  Administered 2021-09-18: 10 mg via ORAL
  Filled 2021-09-18: qty 1

## 2021-09-18 MED ORDER — ZOLEDRONIC ACID 4 MG/100ML IV SOLN
4.0000 mg | Freq: Once | INTRAVENOUS | Status: AC
  Administered 2021-09-18: 4 mg via INTRAVENOUS
  Filled 2021-09-18: qty 100

## 2021-09-18 NOTE — Patient Instructions (Signed)
[facility]  Discharge Instructions: Thank you for choosing [facility] to provide your oncology and hematology care.   If you have a lab appointment with the Cancer Center, please go directly to the Cancer Center and check in at the registration area.   Wear comfortable clothing and clothing appropriate for easy access to any Portacath or PICC line.   We strive to give you quality time with your provider. You may need to reschedule your appointment if you arrive late (15 or more minutes).  Arriving late affects you and other patients whose appointments are after yours.  Also, if you miss three or more appointments without notifying the office, you may be dismissed from the clinic at the provider's discretion.      For prescription refill requests, have your pharmacy contact our office and allow 72 hours for refills to be completed.    Today you received the following chemotherapy and/or immunotherapy agents carfilzomib   To help prevent nausea and vomiting after your treatment, we encourage you to take your nausea medication as directed.  BELOW ARE SYMPTOMS THAT SHOULD BE REPORTED IMMEDIATELY: . *FEVER GREATER THAN 100.4 F (38 C) OR HIGHER . *CHILLS OR SWEATING . *NAUSEA AND VOMITING THAT IS NOT CONTROLLED WITH YOUR NAUSEA MEDICATION . *UNUSUAL SHORTNESS OF BREATH . *UNUSUAL BRUISING OR BLEEDING . *URINARY PROBLEMS (pain or burning when urinating, or frequent urination) . *BOWEL PROBLEMS (unusual diarrhea, constipation, pain near the anus) . TENDERNESS IN MOUTH AND THROAT WITH OR WITHOUT PRESENCE OF ULCERS (sore throat, sores in mouth, or a toothache) . UNUSUAL RASH, SWELLING OR PAIN  . UNUSUAL VAGINAL DISCHARGE OR ITCHING   Items with * indicate a potential emergency and should be followed up as soon as possible or go to the Emergency Department if any problems should occur.  Please show the CHEMOTHERAPY ALERT CARD or IMMUNOTHERAPY ALERT  CARD at check-in to the Emergency Department and triage nurse.  Should you have questions after your visit or need to cancel or reschedule your appointment, please contact [facility]  Dept: [phone]  and follow the prompts.  Office hours are 8:00 a.m. to 4:30 p.m. Monday - Friday. Please note that voicemails left after 4:00 p.m. may not be returned until the following business day.  We are closed weekends and major holidays. You have access to a nurse at all times for urgent questions. Please call the main number to the clinic Dept: [phone] and follow the prompts.   For any non-urgent questions, you may also contact your provider using MyChart. We now offer e-Visits for anyone 18 and older to request care online for non-urgent symptoms. For details visit [email or web address].   Also download the MyChart app! Go to the app store, search "MyChart", open the app, select [place], and log in with your MyChart username and password.  Due to Covid, a mask is required upon entering the hospital/clinic. If you do not have a mask, one will be given to you upon arrival. For doctor visits, patients may have 1 support person aged 18 or older with them. For treatment visits, patients cannot have anyone with them due to current Covid guidelines and our immunocompromised population.   

## 2021-09-18 NOTE — Progress Notes (Signed)
Per Dr. Irene Limbo, ok to treat today with ANC 1.4 and Platelet 93.

## 2021-09-18 NOTE — Progress Notes (Signed)
Per Dr Irene Limbo: labs have been assessed, Pt is okay for flu shot today.

## 2021-09-19 ENCOUNTER — Encounter: Payer: Self-pay | Admitting: Hematology

## 2021-09-19 MED ORDER — FENTANYL 12 MCG/HR TD PT72: 1.0000 | MEDICATED_PATCH | TRANSDERMAL | 0 refills | Status: DC

## 2021-09-19 MED ORDER — LIDOCAINE-PRILOCAINE 2.5-2.5 % EX CREA: TOPICAL_CREAM | CUTANEOUS | 0 refills | Status: AC

## 2021-09-25 ENCOUNTER — Ambulatory Visit: Payer: Medicare PPO

## 2021-09-25 ENCOUNTER — Inpatient Hospital Stay: Payer: Medicare PPO

## 2021-09-25 ENCOUNTER — Other Ambulatory Visit: Payer: Self-pay

## 2021-09-25 ENCOUNTER — Other Ambulatory Visit: Payer: Medicare PPO

## 2021-09-25 ENCOUNTER — Other Ambulatory Visit: Payer: Self-pay | Admitting: Hematology

## 2021-09-25 VITALS — BP 130/61 | HR 76 | Temp 97.7°F | Resp 16 | Ht 59.0 in | Wt 152.8 lb

## 2021-09-25 DIAGNOSIS — C9000 Multiple myeloma not having achieved remission: Secondary | ICD-10-CM

## 2021-09-25 DIAGNOSIS — Z95828 Presence of other vascular implants and grafts: Secondary | ICD-10-CM

## 2021-09-25 DIAGNOSIS — Z7189 Other specified counseling: Secondary | ICD-10-CM

## 2021-09-25 DIAGNOSIS — Z5112 Encounter for antineoplastic immunotherapy: Secondary | ICD-10-CM | POA: Diagnosis not present

## 2021-09-25 DIAGNOSIS — Z23 Encounter for immunization: Secondary | ICD-10-CM | POA: Diagnosis not present

## 2021-09-25 LAB — CBC WITH DIFFERENTIAL (CANCER CENTER ONLY)
Abs Immature Granulocytes: 0.02 10*3/uL (ref 0.00–0.07)
Basophils Absolute: 0 10*3/uL (ref 0.0–0.1)
Basophils Relative: 1 %
Eosinophils Absolute: 0.1 10*3/uL (ref 0.0–0.5)
Eosinophils Relative: 2 %
HCT: 35.9 % — ABNORMAL LOW (ref 36.0–46.0)
Hemoglobin: 11.9 g/dL — ABNORMAL LOW (ref 12.0–15.0)
Immature Granulocytes: 1 %
Lymphocytes Relative: 22 %
Lymphs Abs: 0.6 10*3/uL — ABNORMAL LOW (ref 0.7–4.0)
MCH: 34.7 pg — ABNORMAL HIGH (ref 26.0–34.0)
MCHC: 33.1 g/dL (ref 30.0–36.0)
MCV: 104.7 fL — ABNORMAL HIGH (ref 80.0–100.0)
Monocytes Absolute: 0.7 10*3/uL (ref 0.1–1.0)
Monocytes Relative: 23 %
Neutro Abs: 1.5 10*3/uL — ABNORMAL LOW (ref 1.7–7.7)
Neutrophils Relative %: 51 %
Platelet Count: 76 10*3/uL — ABNORMAL LOW (ref 150–400)
RBC: 3.43 MIL/uL — ABNORMAL LOW (ref 3.87–5.11)
RDW: 14.3 % (ref 11.5–15.5)
WBC Count: 2.9 10*3/uL — ABNORMAL LOW (ref 4.0–10.5)
nRBC: 0 % (ref 0.0–0.2)

## 2021-09-25 LAB — CMP (CANCER CENTER ONLY)
ALT: 15 U/L (ref 0–44)
AST: 20 U/L (ref 15–41)
Albumin: 3.7 g/dL (ref 3.5–5.0)
Alkaline Phosphatase: 91 U/L (ref 38–126)
Anion gap: 7 (ref 5–15)
BUN: 14 mg/dL (ref 8–23)
CO2: 21 mmol/L — ABNORMAL LOW (ref 22–32)
Calcium: 9.9 mg/dL (ref 8.9–10.3)
Chloride: 109 mmol/L (ref 98–111)
Creatinine: 1.07 mg/dL — ABNORMAL HIGH (ref 0.44–1.00)
GFR, Estimated: 53 mL/min — ABNORMAL LOW (ref >60)
Glucose, Bld: 173 mg/dL — ABNORMAL HIGH (ref 70–99)
Potassium: 3.7 mmol/L (ref 3.5–5.1)
Sodium: 137 mmol/L (ref 135–145)
Total Bilirubin: 0.7 mg/dL (ref 0.3–1.2)
Total Protein: 6.4 g/dL — ABNORMAL LOW (ref 6.5–8.1)

## 2021-09-25 MED ORDER — SODIUM CHLORIDE 0.9 % IV SOLN: Freq: Once | INTRAVENOUS | Status: AC

## 2021-09-25 MED ORDER — DIPHENHYDRAMINE HCL 25 MG PO CAPS
25.0000 mg | ORAL_CAPSULE | Freq: Once | ORAL | Status: AC
  Administered 2021-09-25: 25 mg via ORAL
  Filled 2021-09-25: qty 1

## 2021-09-25 MED ORDER — HEPARIN SOD (PORK) LOCK FLUSH 100 UNIT/ML IV SOLN
500.0000 [IU] | Freq: Once | INTRAVENOUS | Status: AC | PRN
  Administered 2021-09-25: 500 [IU]

## 2021-09-25 MED ORDER — PROCHLORPERAZINE MALEATE 10 MG PO TABS
10.0000 mg | ORAL_TABLET | Freq: Once | ORAL | Status: AC
  Administered 2021-09-25: 10 mg via ORAL
  Filled 2021-09-25: qty 1

## 2021-09-25 MED ORDER — DEXAMETHASONE 4 MG PO TABS
12.0000 mg | ORAL_TABLET | Freq: Once | ORAL | Status: AC
  Administered 2021-09-25: 12 mg via ORAL
  Filled 2021-09-25: qty 3

## 2021-09-25 MED ORDER — ACETAMINOPHEN 325 MG PO TABS
650.0000 mg | ORAL_TABLET | Freq: Once | ORAL | Status: AC
  Administered 2021-09-25: 650 mg via ORAL
  Filled 2021-09-25: qty 2

## 2021-09-25 MED ORDER — SODIUM CHLORIDE 0.9% FLUSH
10.0000 mL | INTRAVENOUS | Status: DC | PRN
  Administered 2021-09-25: 10 mL

## 2021-09-25 MED ORDER — FAMOTIDINE 20 MG PO TABS
20.0000 mg | ORAL_TABLET | Freq: Once | ORAL | Status: AC
  Administered 2021-09-25: 20 mg via ORAL
  Filled 2021-09-25: qty 1

## 2021-09-25 MED ORDER — DEXTROSE 5 % IV SOLN
56.0000 mg/m2 | Freq: Once | INTRAVENOUS | Status: AC
  Administered 2021-09-25: 100 mg via INTRAVENOUS
  Filled 2021-09-25: qty 15

## 2021-09-25 MED ORDER — SODIUM CHLORIDE 0.9% FLUSH
10.0000 mL | Freq: Once | INTRAVENOUS | Status: AC
Start: 2021-09-25 — End: 2021-09-25
  Administered 2021-09-25: 10 mL

## 2021-09-25 NOTE — Patient Instructions (Signed)
[facility]  Discharge Instructions: Thank you for choosing [facility] to provide your oncology and hematology care.   If you have a lab appointment with the Cancer Center, please go directly to the Cancer Center and check in at the registration area.   Wear comfortable clothing and clothing appropriate for easy access to any Portacath or PICC line.   We strive to give you quality time with your provider. You may need to reschedule your appointment if you arrive late (15 or more minutes).  Arriving late affects you and other patients whose appointments are after yours.  Also, if you miss three or more appointments without notifying the office, you may be dismissed from the clinic at the provider's discretion.      For prescription refill requests, have your pharmacy contact our office and allow 72 hours for refills to be completed.    Today you received the following chemotherapy and/or immunotherapy agents carfilzomib   To help prevent nausea and vomiting after your treatment, we encourage you to take your nausea medication as directed.  BELOW ARE SYMPTOMS THAT SHOULD BE REPORTED IMMEDIATELY: . *FEVER GREATER THAN 100.4 F (38 C) OR HIGHER . *CHILLS OR SWEATING . *NAUSEA AND VOMITING THAT IS NOT CONTROLLED WITH YOUR NAUSEA MEDICATION . *UNUSUAL SHORTNESS OF BREATH . *UNUSUAL BRUISING OR BLEEDING . *URINARY PROBLEMS (pain or burning when urinating, or frequent urination) . *BOWEL PROBLEMS (unusual diarrhea, constipation, pain near the anus) . TENDERNESS IN MOUTH AND THROAT WITH OR WITHOUT PRESENCE OF ULCERS (sore throat, sores in mouth, or a toothache) . UNUSUAL RASH, SWELLING OR PAIN  . UNUSUAL VAGINAL DISCHARGE OR ITCHING   Items with * indicate a potential emergency and should be followed up as soon as possible or go to the Emergency Department if any problems should occur.  Please show the CHEMOTHERAPY ALERT CARD or IMMUNOTHERAPY ALERT  CARD at check-in to the Emergency Department and triage nurse.  Should you have questions after your visit or need to cancel or reschedule your appointment, please contact [facility]  Dept: [phone]  and follow the prompts.  Office hours are 8:00 a.m. to 4:30 p.m. Monday - Friday. Please note that voicemails left after 4:00 p.m. may not be returned until the following business day.  We are closed weekends and major holidays. You have access to a nurse at all times for urgent questions. Please call the main number to the clinic Dept: [phone] and follow the prompts.   For any non-urgent questions, you may also contact your provider using MyChart. We now offer e-Visits for anyone 18 and older to request care online for non-urgent symptoms. For details visit [email or web address].   Also download the MyChart app! Go to the app store, search "MyChart", open the app, select [place], and log in with your MyChart username and password.  Due to Covid, a mask is required upon entering the hospital/clinic. If you do not have a mask, one will be given to you upon arrival. For doctor visits, patients may have 1 support person aged 18 or older with them. For treatment visits, patients cannot have anyone with them due to current Covid guidelines and our immunocompromised population.   

## 2021-09-25 NOTE — Progress Notes (Signed)
Ok to proceed with today's treatment without CMP results per Dr. Irene Limbo.

## 2021-09-30 ENCOUNTER — Other Ambulatory Visit: Payer: Self-pay | Admitting: Internal Medicine

## 2021-09-30 ENCOUNTER — Encounter: Payer: Self-pay | Admitting: Hematology

## 2021-09-30 ENCOUNTER — Other Ambulatory Visit: Payer: Self-pay

## 2021-09-30 DIAGNOSIS — C9000 Multiple myeloma not having achieved remission: Secondary | ICD-10-CM

## 2021-10-02 ENCOUNTER — Encounter: Payer: Self-pay | Admitting: Hematology

## 2021-10-02 MED ORDER — OXYCODONE HCL 10 MG PO TABS: 10.0000 mg | ORAL_TABLET | Freq: Four times a day (QID) | ORAL | 0 refills | Status: DC | PRN

## 2021-10-09 ENCOUNTER — Other Ambulatory Visit: Payer: Self-pay | Admitting: Hematology

## 2021-10-09 DIAGNOSIS — C9000 Multiple myeloma not having achieved remission: Secondary | ICD-10-CM

## 2021-10-16 ENCOUNTER — Inpatient Hospital Stay: Payer: Medicare PPO

## 2021-10-16 ENCOUNTER — Other Ambulatory Visit: Payer: Self-pay | Admitting: Radiology

## 2021-10-16 ENCOUNTER — Other Ambulatory Visit: Payer: Self-pay

## 2021-10-16 ENCOUNTER — Inpatient Hospital Stay: Payer: Medicare PPO | Attending: Hematology

## 2021-10-16 VITALS — BP 133/66 | HR 73 | Temp 98.5°F | Resp 18 | Wt 152.0 lb

## 2021-10-16 DIAGNOSIS — Z5112 Encounter for antineoplastic immunotherapy: Secondary | ICD-10-CM | POA: Insufficient documentation

## 2021-10-16 DIAGNOSIS — C9000 Multiple myeloma not having achieved remission: Secondary | ICD-10-CM

## 2021-10-16 DIAGNOSIS — Z7189 Other specified counseling: Secondary | ICD-10-CM

## 2021-10-16 DIAGNOSIS — Z95828 Presence of other vascular implants and grafts: Secondary | ICD-10-CM

## 2021-10-16 DIAGNOSIS — Z79899 Other long term (current) drug therapy: Secondary | ICD-10-CM | POA: Insufficient documentation

## 2021-10-16 LAB — CMP (CANCER CENTER ONLY)
ALT: 13 U/L (ref 0–44)
AST: 11 U/L — ABNORMAL LOW (ref 15–41)
Albumin: 3.1 g/dL — ABNORMAL LOW (ref 3.5–5.0)
Alkaline Phosphatase: 107 U/L (ref 38–126)
Anion gap: 10 (ref 5–15)
BUN: 11 mg/dL (ref 8–23)
CO2: 18 mmol/L — ABNORMAL LOW (ref 22–32)
Calcium: 8.7 mg/dL — ABNORMAL LOW (ref 8.9–10.3)
Chloride: 111 mmol/L (ref 98–111)
Creatinine: 1 mg/dL (ref 0.44–1.00)
GFR, Estimated: 58 mL/min — ABNORMAL LOW (ref >60)
Glucose, Bld: 253 mg/dL — ABNORMAL HIGH (ref 70–99)
Potassium: 3.3 mmol/L — ABNORMAL LOW (ref 3.5–5.1)
Sodium: 139 mmol/L (ref 135–145)
Total Bilirubin: 0.6 mg/dL (ref 0.3–1.2)
Total Protein: 5.7 g/dL — ABNORMAL LOW (ref 6.5–8.1)

## 2021-10-16 LAB — CBC WITH DIFFERENTIAL (CANCER CENTER ONLY)
Abs Immature Granulocytes: 0.01 10*3/uL (ref 0.00–0.07)
Basophils Absolute: 0 10*3/uL (ref 0.0–0.1)
Basophils Relative: 1 %
Eosinophils Absolute: 0.1 10*3/uL (ref 0.0–0.5)
Eosinophils Relative: 2 %
HCT: 33.7 % — ABNORMAL LOW (ref 36.0–46.0)
Hemoglobin: 11.3 g/dL — ABNORMAL LOW (ref 12.0–15.0)
Immature Granulocytes: 0 %
Lymphocytes Relative: 21 %
Lymphs Abs: 0.6 10*3/uL — ABNORMAL LOW (ref 0.7–4.0)
MCH: 35.4 pg — ABNORMAL HIGH (ref 26.0–34.0)
MCHC: 33.5 g/dL (ref 30.0–36.0)
MCV: 105.6 fL — ABNORMAL HIGH (ref 80.0–100.0)
Monocytes Absolute: 0.6 10*3/uL (ref 0.1–1.0)
Monocytes Relative: 21 %
Neutro Abs: 1.6 10*3/uL — ABNORMAL LOW (ref 1.7–7.7)
Neutrophils Relative %: 55 %
Platelet Count: 95 10*3/uL — ABNORMAL LOW (ref 150–400)
RBC: 3.19 MIL/uL — ABNORMAL LOW (ref 3.87–5.11)
RDW: 14.6 % (ref 11.5–15.5)
WBC Count: 2.9 10*3/uL — ABNORMAL LOW (ref 4.0–10.5)
nRBC: 0 % (ref 0.0–0.2)

## 2021-10-16 MED ORDER — ZOLEDRONIC ACID 4 MG/100ML IV SOLN
4.0000 mg | Freq: Once | INTRAVENOUS | Status: AC
  Administered 2021-10-16: 4 mg via INTRAVENOUS
  Filled 2021-10-16: qty 100

## 2021-10-16 MED ORDER — ACETAMINOPHEN 325 MG PO TABS
650.0000 mg | ORAL_TABLET | Freq: Once | ORAL | Status: AC
  Administered 2021-10-16: 650 mg via ORAL
  Filled 2021-10-16: qty 2

## 2021-10-16 MED ORDER — HEPARIN SOD (PORK) LOCK FLUSH 100 UNIT/ML IV SOLN
500.0000 [IU] | Freq: Once | INTRAVENOUS | Status: AC | PRN
  Administered 2021-10-16: 500 [IU]

## 2021-10-16 MED ORDER — DEXAMETHASONE 4 MG PO TABS
12.0000 mg | ORAL_TABLET | Freq: Once | ORAL | Status: AC
  Administered 2021-10-16: 12 mg via ORAL
  Filled 2021-10-16: qty 3

## 2021-10-16 MED ORDER — FAMOTIDINE 20 MG PO TABS
20.0000 mg | ORAL_TABLET | Freq: Once | ORAL | Status: AC
  Administered 2021-10-16: 20 mg via ORAL
  Filled 2021-10-16: qty 1

## 2021-10-16 MED ORDER — DIPHENHYDRAMINE HCL 25 MG PO CAPS
25.0000 mg | ORAL_CAPSULE | Freq: Once | ORAL | Status: AC
  Administered 2021-10-16: 25 mg via ORAL
  Filled 2021-10-16: qty 1

## 2021-10-16 MED ORDER — SODIUM CHLORIDE 0.9% FLUSH
10.0000 mL | Freq: Once | INTRAVENOUS | Status: AC
  Administered 2021-10-16: 10 mL

## 2021-10-16 MED ORDER — SODIUM CHLORIDE 0.9 % IV SOLN: Freq: Once | INTRAVENOUS | Status: AC

## 2021-10-16 MED ORDER — DEXTROSE 5 % IV SOLN
56.0000 mg/m2 | Freq: Once | INTRAVENOUS | Status: AC
  Administered 2021-10-16: 100 mg via INTRAVENOUS
  Filled 2021-10-16: qty 30

## 2021-10-16 MED ORDER — SODIUM CHLORIDE 0.9% FLUSH
10.0000 mL | INTRAVENOUS | Status: DC | PRN
  Administered 2021-10-16: 10 mL

## 2021-10-16 MED ORDER — PROCHLORPERAZINE MALEATE 10 MG PO TABS
10.0000 mg | ORAL_TABLET | Freq: Once | ORAL | Status: AC
  Administered 2021-10-16: 10 mg via ORAL
  Filled 2021-10-16: qty 1

## 2021-10-16 NOTE — Patient Instructions (Signed)
[facility]  Discharge Instructions: Thank you for choosing [facility] to provide your oncology and hematology care.   If you have a lab appointment with the Cancer Center, please go directly to the Cancer Center and check in at the registration area.   Wear comfortable clothing and clothing appropriate for easy access to any Portacath or PICC line.   We strive to give you quality time with your provider. You may need to reschedule your appointment if you arrive late (15 or more minutes).  Arriving late affects you and other patients whose appointments are after yours.  Also, if you miss three or more appointments without notifying the office, you may be dismissed from the clinic at the provider's discretion.      For prescription refill requests, have your pharmacy contact our office and allow 72 hours for refills to be completed.    Today you received the following chemotherapy and/or immunotherapy agents kyprolis      To help prevent nausea and vomiting after your treatment, we encourage you to take your nausea medication as directed.  BELOW ARE SYMPTOMS THAT SHOULD BE REPORTED IMMEDIATELY: . *FEVER GREATER THAN 100.4 F (38 C) OR HIGHER . *CHILLS OR SWEATING . *NAUSEA AND VOMITING THAT IS NOT CONTROLLED WITH YOUR NAUSEA MEDICATION . *UNUSUAL SHORTNESS OF BREATH . *UNUSUAL BRUISING OR BLEEDING . *URINARY PROBLEMS (pain or burning when urinating, or frequent urination) . *BOWEL PROBLEMS (unusual diarrhea, constipation, pain near the anus) . TENDERNESS IN MOUTH AND THROAT WITH OR WITHOUT PRESENCE OF ULCERS (sore throat, sores in mouth, or a toothache) . UNUSUAL RASH, SWELLING OR PAIN  . UNUSUAL VAGINAL DISCHARGE OR ITCHING   Items with * indicate a potential emergency and should be followed up as soon as possible or go to the Emergency Department if any problems should occur.  Please show the CHEMOTHERAPY ALERT CARD or IMMUNOTHERAPY ALERT  CARD at check-in to the Emergency Department and triage nurse.  Should you have questions after your visit or need to cancel or reschedule your appointment, please contact [facility]  Dept: [phone]  and follow the prompts.  Office hours are 8:00 a.m. to 4:30 p.m. Monday - Friday. Please note that voicemails left after 4:00 p.m. may not be returned until the following business day.  We are closed weekends and major holidays. You have access to a nurse at all times for urgent questions. Please call the main number to the clinic Dept: [phone] and follow the prompts.   For any non-urgent questions, you may also contact your provider using MyChart. We now offer e-Visits for anyone 18 and older to request care online for non-urgent symptoms. For details visit [email or web address].   Also download the MyChart app! Go to the app store, search "MyChart", open the app, select [place], and log in with your MyChart username and password.  Due to Covid, a mask is required upon entering the hospital/clinic. If you do not have a mask, one will be given to you upon arrival. For doctor visits, patients may have 1 support person aged 18 or older with them. For treatment visits, patients cannot have anyone with them due to current Covid guidelines and our immunocompromised population.   

## 2021-10-16 NOTE — Progress Notes (Signed)
Per Dr. Irene Limbo, ok to treat with Plt 95.

## 2021-10-17 ENCOUNTER — Ambulatory Visit (HOSPITAL_COMMUNITY)
Admission: RE | Admit: 2021-10-17 | Discharge: 2021-10-17 | Disposition: A | Discharge status: Home / Self Care | Payer: Medicare PPO | Source: Ambulatory Visit | Attending: Hematology | Admitting: Hematology

## 2021-10-17 DIAGNOSIS — C9000 Multiple myeloma not having achieved remission: Secondary | ICD-10-CM | POA: Diagnosis not present

## 2021-10-17 LAB — GLUCOSE, CAPILLARY: Glucose-Capillary: 172 mg/dL — ABNORMAL HIGH (ref 70–99)

## 2021-10-17 MED ORDER — FLUDEOXYGLUCOSE F - 18 (FDG) INJECTION
7.5000 | Freq: Once | INTRAVENOUS | Status: AC
  Administered 2021-10-17: 7.5 via INTRAVENOUS

## 2021-10-20 ENCOUNTER — Other Ambulatory Visit: Payer: Self-pay

## 2021-10-20 ENCOUNTER — Encounter (HOSPITAL_COMMUNITY): Payer: Self-pay

## 2021-10-20 ENCOUNTER — Ambulatory Visit (HOSPITAL_COMMUNITY)
Admission: RE | Admit: 2021-10-20 | Discharge: 2021-10-20 | Disposition: A | Discharge status: Home / Self Care | Payer: Medicare PPO | Source: Ambulatory Visit | Attending: Hematology | Admitting: Hematology

## 2021-10-20 DIAGNOSIS — E785 Hyperlipidemia, unspecified: Secondary | ICD-10-CM | POA: Diagnosis not present

## 2021-10-20 DIAGNOSIS — E119 Type 2 diabetes mellitus without complications: Secondary | ICD-10-CM | POA: Diagnosis not present

## 2021-10-20 DIAGNOSIS — D61818 Other pancytopenia: Secondary | ICD-10-CM | POA: Diagnosis not present

## 2021-10-20 DIAGNOSIS — C9000 Multiple myeloma not having achieved remission: Secondary | ICD-10-CM | POA: Insufficient documentation

## 2021-10-20 DIAGNOSIS — F32A Depression, unspecified: Secondary | ICD-10-CM | POA: Insufficient documentation

## 2021-10-20 DIAGNOSIS — Z79899 Other long term (current) drug therapy: Secondary | ICD-10-CM | POA: Diagnosis not present

## 2021-10-20 LAB — CBC WITH DIFFERENTIAL/PLATELET
Abs Immature Granulocytes: 0.03 10*3/uL (ref 0.00–0.07)
Basophils Absolute: 0 10*3/uL (ref 0.0–0.1)
Basophils Relative: 0 %
Eosinophils Absolute: 0 10*3/uL (ref 0.0–0.5)
Eosinophils Relative: 1 %
HCT: 32 % — ABNORMAL LOW (ref 36.0–46.0)
Hemoglobin: 10.7 g/dL — ABNORMAL LOW (ref 12.0–15.0)
Immature Granulocytes: 1 %
Lymphocytes Relative: 19 %
Lymphs Abs: 0.7 10*3/uL (ref 0.7–4.0)
MCH: 35.7 pg — ABNORMAL HIGH (ref 26.0–34.0)
MCHC: 33.4 g/dL (ref 30.0–36.0)
MCV: 106.7 fL — ABNORMAL HIGH (ref 80.0–100.0)
Monocytes Absolute: 0.9 10*3/uL (ref 0.1–1.0)
Monocytes Relative: 27 %
Neutro Abs: 1.8 10*3/uL (ref 1.7–7.7)
Neutrophils Relative %: 52 %
Platelets: 43 10*3/uL — ABNORMAL LOW (ref 150–400)
RBC: 3 MIL/uL — ABNORMAL LOW (ref 3.87–5.11)
RDW: 14.2 % (ref 11.5–15.5)
WBC: 3.5 10*3/uL — ABNORMAL LOW (ref 4.0–10.5)
nRBC: 1.2 % — ABNORMAL HIGH (ref 0.0–0.2)

## 2021-10-20 LAB — GLUCOSE, CAPILLARY: Glucose-Capillary: 135 mg/dL — ABNORMAL HIGH (ref 70–99)

## 2021-10-20 MED ORDER — SODIUM CHLORIDE 0.9 % IV SOLN: INTRAVENOUS | Status: DC

## 2021-10-20 MED ORDER — MIDAZOLAM HCL 5 MG/5ML IJ SOLN
INTRAMUSCULAR | Status: AC | PRN
  Administered 2021-10-20: 1 mg via INTRAVENOUS

## 2021-10-20 MED ORDER — MIDAZOLAM HCL 2 MG/2ML IJ SOLN
INTRAMUSCULAR | Status: AC | PRN
  Administered 2021-10-20: 1 mg via INTRAVENOUS

## 2021-10-20 MED ORDER — MIDAZOLAM HCL 2 MG/2ML IJ SOLN
INTRAMUSCULAR | Status: AC
  Filled 2021-10-20: qty 4

## 2021-10-20 MED ORDER — LIDOCAINE HCL (PF) 1 % IJ SOLN
INTRAMUSCULAR | Status: AC | PRN
  Administered 2021-10-20: 15 mL

## 2021-10-20 MED ORDER — FENTANYL CITRATE (PF) 100 MCG/2ML IJ SOLN
INTRAMUSCULAR | Status: AC
  Filled 2021-10-20: qty 2

## 2021-10-20 MED ORDER — FENTANYL CITRATE (PF) 100 MCG/2ML IJ SOLN
INTRAMUSCULAR | Status: AC | PRN
  Administered 2021-10-20 (×2): 50 ug via INTRAVENOUS

## 2021-10-20 NOTE — Progress Notes (Signed)
VAST consult received to access port for IR use. Upon arrival in short stay, RN advised that they will access port. She forgot to notify VAS team that we were not needed.

## 2021-10-20 NOTE — H&P (Signed)
Chief Complaint: Patient was seen in consultation today for bone marrow aspiration/biopsy.  Referring Physician(s): Brunetta Genera  Supervising Physician: Michaelle Birks  Patient Status: [facility] - Out-pt  History of Present Illness: Faith Orr is a 80 y.o. female with a past medical history significant for depression, HLD, DM and multiple myeloma who presents today for a bone marrow aspiration/biopsy with moderate sedation. Faith Orr was first diagnosed with multiple myeloma in 2019 and has been followed by Dr. Irene Limbo since that time. She is currently undergoing treatment with Carfilzomib and Revlimid - IR has been asked to perform a bone marrow aspiration/biopsy to assess her response to treatment.  Faith Orr denies any complaints, she has been tolerating treatment well and feels well overall. She has a bone marrow biopsy ever 4 or so months so she is very familiar with the procedure and is agreeable to proceed.  Past Medical History:  Diagnosis Date   Allergy    seasonal   Asthma    DEPRESSION    DIABETES MELLITUS, TYPE II    Diverticulosis    HYPERLIPIDEMIA    Macular degeneration of left eye    mild, Dr.Hecker   Obesity, unspecified    Osteoarthritis of both knees    OSTEOPENIA    Osteopenia    URINARY INCONTINENCE     Past Surgical History:  Procedure Laterality Date   CATARACT EXTRACTION Left 05/24/2018   CESAREAN SECTION  01/1973   CYSTOSCOPY/URETEROSCOPY/HOLMIUM LASER/STENT PLACEMENT Right 09/20/2019   Procedure: CYSTOSCOPY/URETEROSCOPY/HOLMIUM LASER/STENT PLACEMENT;  Surgeon: Lucas Mallow, MD;  Location: [facility];  Service: Urology;  Laterality: Right;   FRACTURE SURGERY     IR IMAGING GUIDED PORT INSERTION  02/20/2019   left wrist surgery  2008   By Dr. Latanya Maudlin   right ankle  1994    Allergies: Penicillins, Aleve [naproxen sodium], and Sulfonamide derivatives  Medications: Prior to Admission medications   Medication Sig Start Date  End Date Taking? Authorizing Provider  acyclovir (ZOVIRAX) 400 MG tablet TAKE 1 TABLET(400 MG) BY MOUTH TWICE DAILY 04/30/21  Yes Brunetta Genera, MD  apixaban (ELIQUIS) 2.5 MG TABS tablet TAKE 1 TABLET(2.5 MG) BY MOUTH TWICE DAILY 07/29/21  Yes Brunetta Genera, MD  Calcium Carbonate-Vitamin D 600-400 MG-UNIT tablet Take 1 tablet by mouth 2 (two) times daily.   Yes [provider]  fentaNYL (DURAGESIC) 12 MCG/HR Place 1 patch onto the skin every 3 (three) days. 09/19/21  Yes Brunetta Genera, MD  lidocaine-prilocaine (EMLA) cream APPLY 1 APPLICATION TO THE AFFECTED AREA AS NEEDED. USE PRIOR TO PORT ACCESS 09/19/21  Yes Brunetta Genera, MD  metFORMIN (GLUCOPHAGE-XR) 500 MG 24 hr tablet TAKE 3 TABLETS(1500 MG) BY MOUTH DAILY WITH BREAKFAST 10/01/21  Yes Hoyt Koch, MD  Oxycodone HCl 10 MG TABS Take 1 tablet (10 mg total) by mouth every 6 (six) hours as needed. 10/02/21  Yes Brunetta Genera, MD  polyethylene glycol [facility] / GLYCOLAX) packet Take 17 g by mouth daily after breakfast.   Yes [provider]  potassium chloride SA (KLOR-CON) 20 MEQ tablet Take 2 tablets (40 mEq total) by mouth 2 (two) times daily. TAKE 2 TABLET(20 MEQ) BY MOUTH TWICE DAILY 07/08/21  Yes Brunetta Genera, MD  senna-docusate (SENNA S) 8.6-50 MG tablet Take 2 tablets by mouth at bedtime. 03/26/20  Yes Brunetta Genera, MD  sertraline (ZOLOFT) 50 MG tablet Take 1 tablet (50 mg total) by mouth daily. 05/05/21  Yes  Hoyt Koch, MD  simvastatin (ZOCOR) 20 MG tablet TAKE 1 TABLET BY MOUTH EVERY DAY AT 6 PM 07/30/21  Yes Hoyt Koch, MD  Vitamin D, Ergocalciferol, (DRISDOL) 1.25 MG (50000 UNIT) CAPS capsule TAKE 1 CAPSULE BY MOUTH EVERY 7 DAYS 09/17/21  Yes Brunetta Genera, MD  Blood Glucose Monitoring Suppl (FREESTYLE FREEDOM LITE) W/DEVICE KIT Use to check blood sugars twice a day Dx 250.00 06/01/14   Rowe Clack, MD  Cetirizine HCl 10 MG CAPS  Take 1 capsule (10 mg total) by mouth daily. 03/09/19   Tanner, Lyndon Code., PA-C  clindamycin (CLEOCIN) 300 MG capsule Take 2 capsules (600 mg total) by mouth once as needed for up to 1 dose (1 hour prior to dental procedures). 03/05/21   Brunetta Genera, MD  dexamethasone (DECADRON) 4 MG tablet Take 5 tablets (20 mg total) by mouth once a week. On D22 of each cycle of treatment 03/09/19   Brunetta Genera, MD  fluticasone [facility]) 50 MCG/ACT nasal spray Place 1 spray into both nostrils daily. 12/11/20   Hoyt Koch, MD  glipiZIDE (GLUCOTROL XL) 5 MG 24 hr tablet TAKE 1 TABLET(5 MG) BY MOUTH DAILY WITH BREAKFAST 03/11/21   Hoyt Koch, MD  glucose blood (FREESTYLE LITE) test strip CHECK BLOOD SUGAR TWICE DAILY AS DIRECTED Dx 250.00 07/13/14   Rowe Clack, MD  Lancets (FREESTYLE) lancets Use twice daily to check sugars. 04/15/16   Hoyt Koch, MD  lenalidomide (REVLIMID) 10 MG capsule TAKE 1 CAPSULE BY MOUTH  DAILY FOR 21 DAYS, THEN 7  DAYS OFF 10/09/21   Brunetta Genera, MD  Multiple Vitamins-Minerals (ICAPS) CAPS Take 1 capsule by mouth daily after breakfast.    [provider]  ondansetron (ZOFRAN) 8 MG tablet Take 1 tablet (8 mg total) by mouth 2 (two) times daily as needed (Nausea or vomiting). 08/20/20   Brunetta Genera, MD  pantoprazole (PROTONIX) 20 MG tablet TAKE 1 TABLET(20 MG) BY MOUTH DAILY 12/11/20   Hoyt Koch, MD  prochlorperazine (COMPAZINE) 10 MG tablet Take 1 tablet (10 mg total) by mouth every 6 (six) hours as needed (Nausea or vomiting). 01/16/19   Brunetta Genera, MD  REVLIMID 10 MG capsule TAKE 1 CAPSULE BY MOUTH  DAILY FOR 21 DAYS, THEN 7  DAYS OFF 09/11/21   Brunetta Genera, MD  Triamcinolone Acetonide 0.025 % LOTN Apply 1 application topically 3 (three) times daily as needed (rash/itching). 03/09/19   Harle Stanford., PA-C     Family History  Problem Relation Age of Onset   Diabetes Father     Hyperlipidemia Father    Heart disease Father    Cancer Father    Hypertension Father    Colon cancer Paternal Grandmother 6   Osteoporosis Mother    Protein S deficiency Mother    Hyperlipidemia Mother    Multiple sclerosis Daughter    Cancer Other        bladder   Breast cancer Neg Hx     Social History   Socioeconomic History   Marital status: Married    Spouse name: Not on file   Number of children: 1   Years of education: Not on file   Highest education level: Not on file  Occupational History    Employer: [employer]  Tobacco Use   Smoking status: Never   Smokeless tobacco: Never   Tobacco comments:    Lives with partner [name] [place])  and son  Vaping Use   Vaping Use: Never used  Substance and Sexual Activity   Alcohol use: No    Alcohol/week: 0.0 standard drinks   Drug use: No   Sexual activity: Never    Partners: Female    Birth control/protection: Post-menopausal    Comment: Lives with female partner (annette hicks) and 33 yo son  Other Topics Concern   Not on file  Social History Narrative   Not on file   Social Determinants of Health   Financial Resource Strain: Not on file  Food Insecurity: Not on file  Transportation Needs: Not on file  Physical Activity: Not on file  Stress: Not on file  Social Connections: Not on file     Review of Systems: A 12 point ROS discussed and pertinent positives are indicated in the HPI above.  All other systems are negative.  Review of Systems  Constitutional:  Negative for chills and fever.  Respiratory:  Negative for cough and shortness of breath.   Cardiovascular:  Negative for chest pain.  Gastrointestinal:  Negative for abdominal pain, nausea and vomiting.  Musculoskeletal:  Negative for back pain.  Neurological:  Negative for headaches.   Vital Signs: BP 118/66   Pulse (!) 58   Temp 97.8 F (36.6 C) (Oral)   Resp 12   LMP 10/09/2012   SpO2 100%   Physical Exam Vitals reviewed.   Constitutional:      General: She is not in acute distress. HENT:     Head: Normocephalic.     Mouth/Throat:     Mouth: Mucous membranes are moist.     Pharynx: Oropharynx is clear. No oropharyngeal exudate or posterior oropharyngeal erythema.  Cardiovascular:     Rate and Rhythm: Normal rate and regular rhythm.  Pulmonary:     Effort: Pulmonary effort is normal.     Breath sounds: Normal breath sounds.  Abdominal:     General: There is no distension.     Palpations: Abdomen is soft.     Tenderness: There is no abdominal tenderness.  Skin:    General: Skin is warm and dry.  Neurological:     Mental Status: She is alert and oriented to person, place, and time.  Psychiatric:        Mood and Affect: Mood normal.        Behavior: Behavior normal.        Thought Content: Thought content normal.        Judgment: Judgment normal.     MD Evaluation Airway: WNL Heart: WNL Abdomen: WNL Chest/ Lungs: WNL ASA  Classification: 2 Mallampati/Airway Score: Two   Imaging: NM PET Image Restage (PS) Whole Body  Result Date: 10/17/2021 CLINICAL DATA:  Subsequent treatment strategy for multiple myeloma. EXAM: NUCLEAR MEDICINE PET WHOLE BODY TECHNIQUE: 7.5 mCi F-18 FDG was injected intravenously. Full-ring PET imaging was performed from the head to foot after the radiotracer. CT data was obtained and used for attenuation correction and anatomic localization. Fasting blood glucose: 172 mg/dl COMPARISON:  None. FINDINGS: Mediastinal blood pool activity: SUV max HEAD/NECK: Hypermetabolic nodule in the LEFT parapharyngeal space with SUV max equal 6.4 (image 42) compared to SUV max equal 7.1 on comparison PET-CT scan. Slightly more inferior hypermetabolic LEFT level 2 lymph node anterior to sternocleidomastoid muscle with SUV max equal 9.9 on image 53. Node is relatively small measuring 6 mm short axis. Node also similar to comparison exam with SUV max equal 14 Incidental CT findings: none CHEST:  No  hypermetabolic mediastinal or hilar nodes. No suspicious pulmonary nodules on the CT scan. Incidental CT findings: none ABDOMEN/PELVIS: No abnormal hypermetabolic activity within the liver, pancreas, adrenal glands, or spleen. No hypermetabolic lymph nodes in the abdomen or pelvis. Incidental CT findings: Atherosclerotic calcification of the aorta. Uterus and adnexa unremarkable. SKELETON: Lesion the proximal LEFT humerus is improved with SUV max equal 2.9 decreased from 5.2. Multiple posterior RIGHT and lateral RIGHT rib lesions are improved. For example posterior RIGHT rib lesion associated the fracture on image 85 with SUV max equal 3.1 compared SUV max equal 5.9. Potential posttraumatic lesions. No new lesions identified. Lytic lesion in the midthoracic spine without radiotracer activity. Incidental CT findings: none EXTREMITIES: No abnormal hypermetabolic activity in the lower extremities. Incidental CT findings: Metabolic activity associated with the head of the LEFT fibula is favored posttraumatic. IMPRESSION: 1. Intense radiotracer activity associated with two LEFT neck lymph nodes similar to comparison exam. 2. Interval decrease in activity in LEFT humerus lesion. 3. 4. Stable decreased activity in several RIGHT rib lesions. Potential posttraumatic. 5. No new lesions identified by FDG PET imaging within the skeleton. 6. No plasmacytoma. Electronically Signed   By: Suzy Bouchard M.D.   On: 10/17/2021 16:41    Labs:  CBC: Recent Labs    08/28/21 0907 09/18/21 1321 09/25/21 1306 10/16/21 1257  WBC 2.9* 2.8* 2.9* 2.9*  HGB 11.8* 11.3* 11.9* 11.3*  HCT 35.0* 33.7* 35.9* 33.7*  PLT 79* 93* 76* 95*    COAGS: Recent Labs    04/28/21 0930  INR 1.0    BMP: Recent Labs    08/28/21 0907 09/18/21 1321 09/25/21 1306 10/16/21 1257  NA 140 140 137 139  K 3.6 3.4* 3.7 3.3*  CL 110 111 109 111  CO2 19* 20* 21* 18*  GLUCOSE 164* 212* 173* 253*  BUN '19 12 14 11  ' CALCIUM 9.7 8.8* 9.9 8.7*   CREATININE 1.13* 1.11* 1.07* 1.00  GFRNONAA 50* 51* 53* 58*    LIVER FUNCTION TESTS: Recent Labs    08/28/21 0907 09/18/21 1321 09/25/21 1306 10/16/21 1257  BILITOT 0.8 0.7 0.7 0.6  AST 13* 10* 20 11*  ALT '14 9 15 13  ' ALKPHOS 94 96 91 107  PROT 6.1* 5.6* 6.4* 5.7*  ALBUMIN 3.3* 3.2* 3.7 3.1*    TUMOR MARKERS: No results for input(s): AFPTM, CEA, CA199, CHROMGRNA in the last 8760 hours.  Assessment and Plan:  80 y/o F with history of multiple myeloma who presents today for bone marrow aspiration/biopsy to assess treatment response.  Risks and benefits of bone marrow aspiration/biopsy was discussed with the patient and/or patient's family including, but not limited to bleeding, infection, damage to adjacent structures or low yield requiring additional tests.  All of the questions were answered and there is agreement to proceed.  Consent signed and in chart.   Thank you for this interesting consult.  I greatly enjoyed meeting Faith Orr and look forward to participating in their care.  A copy of this report was sent to the requesting provider on this date.  Electronically Signed: Joaquim Nam, PA-C 10/20/2021, 9:59 AM   I spent a total of 30 Minutes  in face to face in clinical consultation, greater than 50% of which was counseling/coordinating care for bone marrow biopsy.

## 2021-10-20 NOTE — Procedures (Signed)
Vascular and Interventional Radiology Procedure Note  Patient: Faith Orr DOB: March 03, 1941 Medical Record Number: [id number] Note Date/Time: 10/20/21 11:42 AM   Performing Physician: Michaelle Birks, MD Assistant(s): None  Diagnosis: MM  Procedure: BONE MARROW ASPIRATION AND BIOPSY  Anesthesia: Conscious Sedation Complications: None Estimated Blood Loss: Minimal Specimens: Sent for Pathology  Findings:  Successful CT-guided bone marrow biopsy A total of 1 cores were obtained. Hemostasis of the tract was achieved using Manual Pressure.  Plan: Bed rest for 1 hours.  See detailed procedure note with images in PACS. The patient tolerated the procedure well without incident or complication and was returned to Recovery in stable condition.    Michaelle Birks, MD [facility] Radiology   Pager. [phone]

## 2021-10-23 ENCOUNTER — Inpatient Hospital Stay: Payer: Medicare PPO

## 2021-10-23 ENCOUNTER — Inpatient Hospital Stay: Payer: Medicare PPO | Admitting: Hematology

## 2021-10-23 ENCOUNTER — Other Ambulatory Visit: Payer: Self-pay

## 2021-10-23 VITALS — BP 118/60 | HR 77 | Temp 97.7°F | Resp 18 | Wt 154.6 lb

## 2021-10-23 DIAGNOSIS — Z7189 Other specified counseling: Secondary | ICD-10-CM

## 2021-10-23 DIAGNOSIS — C9000 Multiple myeloma not having achieved remission: Secondary | ICD-10-CM

## 2021-10-23 DIAGNOSIS — Z79899 Other long term (current) drug therapy: Secondary | ICD-10-CM | POA: Diagnosis not present

## 2021-10-23 DIAGNOSIS — Z95828 Presence of other vascular implants and grafts: Secondary | ICD-10-CM

## 2021-10-23 DIAGNOSIS — Z5112 Encounter for antineoplastic immunotherapy: Secondary | ICD-10-CM | POA: Diagnosis not present

## 2021-10-23 LAB — CBC WITH DIFFERENTIAL (CANCER CENTER ONLY)
Abs Immature Granulocytes: 0.04 10*3/uL (ref 0.00–0.07)
Basophils Absolute: 0 10*3/uL (ref 0.0–0.1)
Basophils Relative: 2 %
Eosinophils Absolute: 0.1 10*3/uL (ref 0.0–0.5)
Eosinophils Relative: 3 %
HCT: 32.3 % — ABNORMAL LOW (ref 36.0–46.0)
Hemoglobin: 10.9 g/dL — ABNORMAL LOW (ref 12.0–15.0)
Immature Granulocytes: 2 %
Lymphocytes Relative: 23 %
Lymphs Abs: 0.6 10*3/uL — ABNORMAL LOW (ref 0.7–4.0)
MCH: 35.7 pg — ABNORMAL HIGH (ref 26.0–34.0)
MCHC: 33.7 g/dL (ref 30.0–36.0)
MCV: 105.9 fL — ABNORMAL HIGH (ref 80.0–100.0)
Monocytes Absolute: 0.8 10*3/uL (ref 0.1–1.0)
Monocytes Relative: 29 %
Neutro Abs: 1.1 10*3/uL — ABNORMAL LOW (ref 1.7–7.7)
Neutrophils Relative %: 41 %
Platelet Count: 65 10*3/uL — ABNORMAL LOW (ref 150–400)
RBC: 3.05 MIL/uL — ABNORMAL LOW (ref 3.87–5.11)
RDW: 14.5 % (ref 11.5–15.5)
WBC Count: 2.7 10*3/uL — ABNORMAL LOW (ref 4.0–10.5)
nRBC: 0 % (ref 0.0–0.2)

## 2021-10-23 LAB — CMP (CANCER CENTER ONLY)
ALT: 13 U/L (ref 0–44)
AST: 10 U/L — ABNORMAL LOW (ref 15–41)
Albumin: 3.2 g/dL — ABNORMAL LOW (ref 3.5–5.0)
Alkaline Phosphatase: 93 U/L (ref 38–126)
Anion gap: 9 (ref 5–15)
BUN: 15 mg/dL (ref 8–23)
CO2: 17 mmol/L — ABNORMAL LOW (ref 22–32)
Calcium: 9.1 mg/dL (ref 8.9–10.3)
Chloride: 111 mmol/L (ref 98–111)
Creatinine: 1.11 mg/dL — ABNORMAL HIGH (ref 0.44–1.00)
GFR, Estimated: 51 mL/min — ABNORMAL LOW (ref >60)
Glucose, Bld: 208 mg/dL — ABNORMAL HIGH (ref 70–99)
Potassium: 3.6 mmol/L (ref 3.5–5.1)
Sodium: 137 mmol/L (ref 135–145)
Total Bilirubin: 0.6 mg/dL (ref 0.3–1.2)
Total Protein: 5.9 g/dL — ABNORMAL LOW (ref 6.5–8.1)

## 2021-10-23 LAB — SURGICAL PATHOLOGY

## 2021-10-23 MED ORDER — DEXAMETHASONE 4 MG PO TABS
12.0000 mg | ORAL_TABLET | Freq: Once | ORAL | Status: AC
  Administered 2021-10-23: 12 mg via ORAL
  Filled 2021-10-23: qty 3

## 2021-10-23 MED ORDER — FAMOTIDINE 20 MG PO TABS
20.0000 mg | ORAL_TABLET | Freq: Once | ORAL | Status: AC
  Administered 2021-10-23: 20 mg via ORAL
  Filled 2021-10-23: qty 1

## 2021-10-23 MED ORDER — ACETAMINOPHEN 325 MG PO TABS
650.0000 mg | ORAL_TABLET | Freq: Once | ORAL | Status: AC
  Administered 2021-10-23: 650 mg via ORAL
  Filled 2021-10-23: qty 2

## 2021-10-23 MED ORDER — DEXTROSE 5 % IV SOLN
56.0000 mg/m2 | Freq: Once | INTRAVENOUS | Status: AC
  Administered 2021-10-23: 100 mg via INTRAVENOUS
  Filled 2021-10-23: qty 5

## 2021-10-23 MED ORDER — DIPHENHYDRAMINE HCL 25 MG PO CAPS
25.0000 mg | ORAL_CAPSULE | Freq: Once | ORAL | Status: AC
  Administered 2021-10-23: 25 mg via ORAL
  Filled 2021-10-23: qty 1

## 2021-10-23 MED ORDER — SODIUM CHLORIDE 0.9% FLUSH
10.0000 mL | INTRAVENOUS | Status: DC | PRN
  Administered 2021-10-23: 10 mL

## 2021-10-23 MED ORDER — PROCHLORPERAZINE MALEATE 10 MG PO TABS
10.0000 mg | ORAL_TABLET | Freq: Once | ORAL | Status: AC
  Administered 2021-10-23: 10 mg via ORAL
  Filled 2021-10-23: qty 1

## 2021-10-23 MED ORDER — SODIUM CHLORIDE 0.9 % IV SOLN: Freq: Once | INTRAVENOUS | Status: AC

## 2021-10-23 MED ORDER — SODIUM CHLORIDE 0.9% FLUSH
10.0000 mL | Freq: Once | INTRAVENOUS | Status: AC
  Administered 2021-10-23: 10 mL

## 2021-10-23 MED ORDER — SODIUM CHLORIDE 0.9 % IV SOLN: Freq: Once | INTRAVENOUS | Status: DC

## 2021-10-23 MED ORDER — HEPARIN SOD (PORK) LOCK FLUSH 100 UNIT/ML IV SOLN
500.0000 [IU] | Freq: Once | INTRAVENOUS | Status: AC | PRN
  Administered 2021-10-23: 500 [IU]

## 2021-10-23 NOTE — Progress Notes (Signed)
Dr Irene Limbo has reviewed labs today pt ok for treatment.

## 2021-10-23 NOTE — Patient Instructions (Signed)
[facility]  Discharge Instructions: Thank you for choosing [facility] to provide your oncology and hematology care.   If you have a lab appointment with the Cancer Center, please go directly to the Cancer Center and check in at the registration area.   Wear comfortable clothing and clothing appropriate for easy access to any Portacath or PICC line.   We strive to give you quality time with your provider. You may need to reschedule your appointment if you arrive late (15 or more minutes).  Arriving late affects you and other patients whose appointments are after yours.  Also, if you miss three or more appointments without notifying the office, you may be dismissed from the clinic at the provider's discretion.      For prescription refill requests, have your pharmacy contact our office and allow 72 hours for refills to be completed.    Today you received the following chemotherapy and/or immunotherapy agents: Carfilzomib.      To help prevent nausea and vomiting after your treatment, we encourage you to take your nausea medication as directed.  BELOW ARE SYMPTOMS THAT SHOULD BE REPORTED IMMEDIATELY: *FEVER GREATER THAN 100.4 F (38 C) OR HIGHER *CHILLS OR SWEATING *NAUSEA AND VOMITING THAT IS NOT CONTROLLED WITH YOUR NAUSEA MEDICATION *UNUSUAL SHORTNESS OF BREATH *UNUSUAL BRUISING OR BLEEDING *URINARY PROBLEMS (pain or burning when urinating, or frequent urination) *BOWEL PROBLEMS (unusual diarrhea, constipation, pain near the anus) TENDERNESS IN MOUTH AND THROAT WITH OR WITHOUT PRESENCE OF ULCERS (sore throat, sores in mouth, or a toothache) UNUSUAL RASH, SWELLING OR PAIN  UNUSUAL VAGINAL DISCHARGE OR ITCHING   Items with * indicate a potential emergency and should be followed up as soon as possible or go to the Emergency Department if any problems should occur.  Please show the CHEMOTHERAPY ALERT CARD or IMMUNOTHERAPY ALERT CARD at check-in  to the Emergency Department and triage nurse.  Should you have questions after your visit or need to cancel or reschedule your appointment, please contact [facility]  Dept: [phone]  and follow the prompts.  Office hours are 8:00 a.m. to 4:30 p.m. Monday - Friday. Please note that voicemails left after 4:00 p.m. may not be returned until the following business day.  We are closed weekends and major holidays. You have access to a nurse at all times for urgent questions. Please call the main number to the clinic Dept: [phone] and follow the prompts.   For any non-urgent questions, you may also contact your provider using MyChart. We now offer e-Visits for anyone 18 and older to request care online for non-urgent symptoms. For details visit [email or web address].   Also download the MyChart app! Go to the app store, search "MyChart", open the app, select [facility], and log in with your MyChart username and password.  Due to Covid, a mask is required upon entering the hospital/clinic. If you do not have a mask, one will be given to you upon arrival. For doctor visits, patients may have 1 support person aged 18 or older with them. For treatment visits, patients cannot have anyone with them due to current Covid guidelines and our immunocompromised population.   

## 2021-10-27 ENCOUNTER — Telehealth: Payer: Self-pay | Admitting: Hematology

## 2021-10-27 NOTE — Telephone Encounter (Signed)
Scheduled follow-up appointments per 11/10 los. Patient is aware.

## 2021-10-29 ENCOUNTER — Encounter (HOSPITAL_COMMUNITY): Payer: Self-pay | Admitting: Hematology

## 2021-10-29 ENCOUNTER — Encounter: Payer: Self-pay | Admitting: Hematology

## 2021-10-30 ENCOUNTER — Encounter (HOSPITAL_COMMUNITY): Payer: Self-pay | Admitting: Hematology

## 2021-10-31 ENCOUNTER — Other Ambulatory Visit: Payer: Self-pay | Admitting: Hematology

## 2021-10-31 DIAGNOSIS — C9000 Multiple myeloma not having achieved remission: Secondary | ICD-10-CM

## 2021-11-02 NOTE — Progress Notes (Signed)
HEMATOLOGY/ONCOLOGY CLINIC NOTE  Date of Service: .10/23/2021  Patient Care Team: Hoyt Koch, MD as PCP - General (Internal Medicine) Lafayette Dragon, MD (Inactive) as Consulting Physician (Gastroenterology) Megan Salon, MD as Consulting Physician (Gynecology) Melrose Nakayama, MD as Consulting Physician (Orthopedic Surgery) Deneise Lever, MD as Consulting Physician (Pulmonary Disease) [name], MD (Ophthalmology)  CHIEF COMPLAINTS/PURPOSE OF CONSULTATION:  Continue mx of myeloma  HISTORY OF PRESENTING ILLNESS:   Faith Orr is a wonderful 80 y.o. female who has been referred to [name] by Dr. Pricilla Holm for evaluation and management of Lytic bone lesions. She is accompanied today by her son in law, and her partner is present via phone. The pt reports that she is doing well overall.   The pt notes that 2-3 months ago while standing at the stove, and otherwise feeling normally, she felt "something snap that took her breath away" in her mid back as she stretched to get something. The pt notes that she saw a chiropractor twice due to her back stiffness, which did not help. The pt then described her new bone pains to her PCP on 12/05/18, and subsequent imaging, as noted below, revealed concerns for numerous bone lesions. She has begun 1m Fosamax. The pt notes that she had hip pain in 2018, and that an XR at that time did not reveal any lesions.  The pt notes that most of her pain is concentrated to her left shoulder presently, and with minimal movement of the arm. She endorses pain radiating into her left arm and notes that her hand is swollen in the mornings when she wakes up. She also endorses present back pain and right hip pain, worse when she walks. She has not yet seen orthopedics. The pt reports that she is needing to take 6049mAdvil every 4-6 hours as Tramadol alone has not been able to alleviate her pain. The pt denies any unexpected weight loss,  fevers, chills, or night sweats. The pt notes that her urine is a very dark color presently, but denies overt blood in the urine, underpants, nor tissue paper. The pt notes that in the last two weeks she has had some soreness in her head, but denies new headaches or changes in vision. The pt notes that she has been urinating more frequently overall, but has been trying to stay better hydrated as well.  The pt notes that she has been compliant with annual mammograms.   The pt notes that she had a cyst in her right breast which was removed in the past. She fractured her left wrist in 2008 after falling down stairs. The pt endorses history of fatty liver.   The pt denies ever smoking cigarettes and endorses significant second hand smoke exposure with a previous marriage.   Of note prior to the patient's visit today, pt has had a Bone Scan completed on 12/13/18 with results revealing Multifocal uptake throughout the skeleton, consistent with diffuse metastatic disease. Primary tumor is not specified. 2. Uptake in the proximal right femur, consistent with lytic lesions. 3. Uptake in the ribs bilaterally as described. 4. Lesions in the proximal left humerus. 5. Diffuse uptake throughout the skull consistent with metastatic disease. 6. Right paramedian uptake at the manubrium.  Most recent lab results (12/08/18) of CBC w/diff and CMP is as follows: all values are WNL except for Glucose at 279, BUN at 24, AST at 41, ALT at 46. 12/08/18 SPEP revealed all values WNL except for Total  Protein at 6.0, Albumin at 3.6, Gamma globulin at 0.7, and M spike at 0.5g  On review of systems, pt reports significant left shoulder pain, back pain, right hip pain, dark urine, and denies fevers, chills, night sweats, unexpected weight loss, changes in bowel habits, changes in breathing, cough, new respiratory symptoms, changes in vision, abdominal pains, leg swelling, and any other symptoms.   On PMHx the pt reports fatty liver,  and denies blood clots.  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 Denies ever smoking.  On Family Hx the pt reports maternal grandmother with colon cancer. Father with bladder cancer and amyloidosis (pt notes that this could have been misdiagnosis). Mother with Protein S deficiency and polymyalgia rheumatica.  Current Treatment: Carfilzomib + Revlimid maintenance.  INTERVAL HISTORY:   Faith Orr returns today for management and evaluation of her Multiple Myeloma. The patient's last visit with [name] was on 08/28/2021.  The pt reports that she is doing well overall.  The pt reports no acute new symptoms. No infection issues.  No abnormal bleeding or bruising. No new treatment toxicities. No new bone pains Grade 1 fatigue which is stable. Lab results today 10/23/2021 CBC with hemoglobin of 10.9, WBC count of 2.7k with ANC of 1100, platelets of 65k CMP stable creatinine 1.1  Her bone marrow biopsy done on 10/20/2021 showed less than 1% plasma cells.  She was noted to have some dyspoietic changes in the erythroid precursors which were thought to be related to her treatment.  And recent PET CT scan on 10/17/2021 showed  1. Intense radiotracer activity associated with two LEFT neck lymph nodes similar to comparison exam. 2. Interval decrease in activity in LEFT humerus lesion. 3. 4. Stable decreased activity in several RIGHT rib lesions. Potential posttraumatic. 5. No new lesions identified by FDG PET imaging within the skeleton. 6. No plasmacytoma.  Patient is glad that she has no overt evidence of myeloma progression on her bone marrow biopsy or PET CT scan.  On review of systems, pt reports no new bone pains and denies fevers/chills/nightsweats and any other symptoms.   MEDICAL HISTORY:  Past Medical History:  Diagnosis Date   Allergy    seasonal   Asthma    DEPRESSION    DIABETES MELLITUS, TYPE II    Diverticulosis     HYPERLIPIDEMIA    Macular degeneration of left eye    mild, Dr.Hecker   Obesity, unspecified    Osteoarthritis of both knees    OSTEOPENIA    Osteopenia    URINARY INCONTINENCE     SURGICAL HISTORY: Past Surgical History:  Procedure Laterality Date   CATARACT EXTRACTION Left 05/24/2018   CESAREAN SECTION  01/1973   CYSTOSCOPY/URETEROSCOPY/HOLMIUM LASER/STENT PLACEMENT Right 09/20/2019   Procedure: CYSTOSCOPY/URETEROSCOPY/HOLMIUM LASER/STENT PLACEMENT;  Surgeon: Lucas Mallow, MD;  Location: [facility];  Service: Urology;  Laterality: Right;   FRACTURE SURGERY     IR IMAGING GUIDED PORT INSERTION  02/20/2019   left wrist surgery  2008   By Dr. Latanya Maudlin   right ankle  1994    SOCIAL HISTORY: Social History   Socioeconomic History   Marital status: Married    Spouse name: Not on file   Number of children: 1   Years of education: Not on file   Highest education level: Not on file  Occupational History    Employer: [employer]  Tobacco Use   Smoking status: Never  Smokeless tobacco: Never   Tobacco comments:    Lives with partner [name]) and son  Vaping Use   Vaping Use: Never used  Substance and Sexual Activity   Alcohol use: No    Alcohol/week: 0.0 standard drinks   Drug use: No   Sexual activity: Never    Partners: Female    Birth control/protection: Post-menopausal    Comment: Lives with female partner (annette hicks) and 32 yo son  Other Topics Concern   Not on file  Social History Narrative   Not on file   Social Determinants of Health   Financial Resource Strain: Not on file  Food Insecurity: Not on file  Transportation Needs: Not on file  Physical Activity: Not on file  Stress: Not on file  Social Connections: Not on file  Intimate Partner Violence: Not At Risk   Fear of Current or Ex-Partner: No   Emotionally Abused: No   Physically Abused: No   Sexually Abused: No    FAMILY HISTORY: Family History   Problem Relation Age of Onset   Diabetes Father    Hyperlipidemia Father    Heart disease Father    Cancer Father    Hypertension Father    Colon cancer Paternal Grandmother 11   Osteoporosis Mother    Protein S deficiency Mother    Hyperlipidemia Mother    Multiple sclerosis Daughter    Cancer Other        bladder   Breast cancer Neg Hx     ALLERGIES:  is allergic to penicillins, aleve [naproxen sodium], and sulfonamide derivatives.  MEDICATIONS:  Current Outpatient Medications  Medication Sig Dispense Refill   acyclovir (ZOVIRAX) 400 MG tablet TAKE 1 TABLET(400 MG) BY MOUTH TWICE DAILY 60 tablet 5   Blood Glucose Monitoring Suppl (FREESTYLE FREEDOM LITE) W/DEVICE KIT Use to check blood sugars twice a day Dx 250.00 1 each 0   Calcium Carbonate-Vitamin D 600-400 MG-UNIT tablet Take 1 tablet by mouth 2 (two) times daily.     Cetirizine HCl 10 MG CAPS Take 1 capsule (10 mg total) by mouth daily. 30 capsule 1   clindamycin (CLEOCIN) 300 MG capsule Take 2 capsules (600 mg total) by mouth once as needed for up to 1 dose (1 hour prior to dental procedures). 2 capsule 0   dexamethasone (DECADRON) 4 MG tablet Take 5 tablets (20 mg total) by mouth once a week. On D22 of each cycle of treatment 20 tablet 5   ELIQUIS 2.5 MG TABS tablet TAKE 1 TABLET(2.5 MG) BY MOUTH TWICE DAILY 60 tablet 2   fentaNYL (DURAGESIC) 12 MCG/HR Place 1 patch onto the skin every 3 (three) days. 10 patch 0   fluticasone (FLONASE) 50 MCG/ACT nasal spray Place 1 spray into both nostrils daily. 16 g 2   glipiZIDE (GLUCOTROL XL) 5 MG 24 hr tablet TAKE 1 TABLET(5 MG) BY MOUTH DAILY WITH BREAKFAST 90 tablet 1   glucose blood (FREESTYLE LITE) test strip CHECK BLOOD SUGAR TWICE DAILY AS DIRECTED Dx 250.00 180 each 3   Lancets (FREESTYLE) lancets Use twice daily to check sugars. 100 each 11   lenalidomide (REVLIMID) 10 MG capsule TAKE 1 CAPSULE BY MOUTH  DAILY FOR 21 DAYS, THEN 7  DAYS OFF 21 capsule 2    lidocaine-prilocaine (EMLA) cream APPLY 1 APPLICATION TO THE AFFECTED AREA AS NEEDED. USE PRIOR TO PORT ACCESS 30 g 0   metFORMIN (GLUCOPHAGE-XR) 500 MG 24 hr tablet TAKE 3 TABLETS(1500 MG) BY MOUTH DAILY WITH  BREAKFAST 90 tablet 1   Multiple Vitamins-Minerals (ICAPS) CAPS Take 1 capsule by mouth daily after breakfast.     ondansetron (ZOFRAN) 8 MG tablet Take 1 tablet (8 mg total) by mouth 2 (two) times daily as needed (Nausea or vomiting). 30 tablet 1   Oxycodone HCl 10 MG TABS Take 1 tablet (10 mg total) by mouth every 6 (six) hours as needed. 90 tablet 0   pantoprazole (PROTONIX) 20 MG tablet TAKE 1 TABLET(20 MG) BY MOUTH DAILY 30 tablet 5   polyethylene glycol (MIRALAX / GLYCOLAX) packet Take 17 g by mouth daily after breakfast.     potassium chloride SA (KLOR-CON) 20 MEQ tablet Take 2 tablets (40 mEq total) by mouth 2 (two) times daily. TAKE 2 TABLET(20 MEQ) BY MOUTH TWICE DAILY 360 tablet 1   prochlorperazine (COMPAZINE) 10 MG tablet Take 1 tablet (10 mg total) by mouth every 6 (six) hours as needed (Nausea or vomiting). 30 tablet 1   REVLIMID 10 MG capsule TAKE 1 CAPSULE BY MOUTH  DAILY FOR 21 DAYS, THEN 7  DAYS OFF 21 capsule 0   senna-docusate (SENNA S) 8.6-50 MG tablet Take 2 tablets by mouth at bedtime. 60 tablet 2   sertraline (ZOLOFT) 50 MG tablet Take 1 tablet (50 mg total) by mouth daily. 90 tablet 3   simvastatin (ZOCOR) 20 MG tablet TAKE 1 TABLET BY MOUTH EVERY DAY AT 6 PM 90 tablet 3   Triamcinolone Acetonide 0.025 % LOTN Apply 1 application topically 3 (three) times daily as needed (rash/itching). 60 mL 2   Vitamin D, Ergocalciferol, (DRISDOL) 1.25 MG (50000 UNIT) CAPS capsule TAKE 1 CAPSULE BY MOUTH EVERY 7 DAYS 12 capsule 0   No current facility-administered medications for this visit.   Facility-Administered Medications Ordered in Other Visits  Medication Dose Route Frequency Provider Last Rate Last Admin   heparin lock flush 100 unit/mL  500 Units Intracatheter Once PRN  Brunetta Genera, MD       sodium chloride flush (NS) 0.9 % injection 10 mL  10 mL Intracatheter PRN Brunetta Genera, MD   10 mL at 10/02/19 1524    REVIEW OF SYSTEMS:   .10 Point review of Systems was done is negative except as noted above.  PHYSICAL EXAMINATION: ECOG FS:2 - Symptomatic, <50% confined to bed  Vitals:   10/23/21 1011  BP: 118/60  Pulse: 77  Resp: 18  Temp: 97.7 F (36.5 C)  SpO2: 99%    Wt Readings from Last 3 Encounters:  10/23/21 154 lb 9 oz (70.1 kg)  10/16/21 152 lb (68.9 kg)  09/25/21 152 lb 12 oz (69.3 kg)   Body mass index is 31.22 kg/m.   Marland Kitchen GENERAL:alert, in no acute distress and comfortable SKIN: no acute rashes, no significant lesions EYES: conjunctiva are pink and non-injected, sclera anicteric OROPHARYNX: MMM, no exudates, no oropharyngeal erythema or ulceration NECK: supple, no JVD LYMPH:  no palpable lymphadenopathy in the cervical, axillary or inguinal regions LUNGS: clear to auscultation b/l with normal respiratory effort HEART: regular rate & rhythm ABDOMEN:  normoactive bowel sounds , non tender, not distended. Extremity: no pedal edema PSYCH: alert & oriented x 3 with fluent speech NEURO: no focal motor/sensory deficits    LABORATORY DATA:  I have reviewed the data as listed  . CBC Latest Ref Rng & Units 10/23/2021 10/20/2021 10/16/2021  WBC 4.0 - 10.5 K/uL 2.7(L) 3.5(L) 2.9(L)  Hemoglobin 12.0 - 15.0 g/dL 10.9(L) 10.7(L) 11.3(L)  Hematocrit 36.0 - 46.0 %  32.3(L) 32.0(L) 33.7(L)  Platelets 150 - 400 K/uL 65(L) 43(L) 95(L)   . CBC    Component Value Date/Time   WBC 2.7 (L) 10/23/2021 0928   WBC 3.5 (L) 10/20/2021 1025   RBC 3.05 (L) 10/23/2021 0928   HGB 10.9 (L) 10/23/2021 0928   HCT 32.3 (L) 10/23/2021 0928   PLT 65 (L) 10/23/2021 0928   MCV 105.9 (H) 10/23/2021 0928   MCH 35.7 (H) 10/23/2021 0928   MCHC 33.7 10/23/2021 0928   RDW 14.5 10/23/2021 0928   LYMPHSABS 0.6 (L) 10/23/2021 0928   MONOABS 0.8  10/23/2021 0928   EOSABS 0.1 10/23/2021 0928   BASOSABS 0.0 10/23/2021 0928    . CMP Latest Ref Rng & Units 10/23/2021 10/16/2021 09/25/2021  Glucose 70 - 99 mg/dL 208(H) 253(H) 173(H)  BUN 8 - 23 mg/dL '15 11 14  ' Creatinine 0.44 - 1.00 mg/dL 1.11(H) 1.00 1.07(H)  Sodium 135 - 145 mmol/L 137 139 137  Potassium 3.5 - 5.1 mmol/L 3.6 3.3(L) 3.7  Chloride 98 - 111 mmol/L 111 111 109  CO2 22 - 32 mmol/L 17(L) 18(L) 21(L)  Calcium 8.9 - 10.3 mg/dL 9.1 8.7(L) 9.9  Total Protein 6.5 - 8.1 g/dL 5.9(L) 5.7(L) 6.4(L)  Total Bilirubin 0.3 - 1.2 mg/dL 0.6 0.6 0.7  Alkaline Phos 38 - 126 U/L 93 107 91  AST 15 - 41 U/L 10(L) 11(L) 20  ALT 0 - 44 U/L '13 13 15   ' 09/18/2019 BM Bx Report ([id number])   09/18/2019 FISH Panel    05/30/2019 BM Bx   01/06/2019 BM Bx:     01/06/19 Cytogenetics:      05/30/19 BM Biopsy:   09/18/2019 FISH Panel    09/18/2019 BM Surgical Pathology ([id number])     RADIOGRAPHIC STUDIES: I have personally reviewed the radiological images as listed and agreed with the findings in the report. NM PET Image Restage (PS) Whole Body  Result Date: 10/17/2021 CLINICAL DATA:  Subsequent treatment strategy for multiple myeloma. EXAM: NUCLEAR MEDICINE PET WHOLE BODY TECHNIQUE: 7.5 mCi F-18 FDG was injected intravenously. Full-ring PET imaging was performed from the head to foot after the radiotracer. CT data was obtained and used for attenuation correction and anatomic localization. Fasting blood glucose: 172 mg/dl COMPARISON:  None. FINDINGS: Mediastinal blood pool activity: SUV max HEAD/NECK: Hypermetabolic nodule in the LEFT parapharyngeal space with SUV max equal 6.4 (image 42) compared to SUV max equal 7.1 on comparison PET-CT scan. Slightly more inferior hypermetabolic LEFT level 2 lymph node anterior to sternocleidomastoid muscle with SUV max equal 9.9 on image 53. Node is relatively small measuring 6 mm short axis. Node also similar to comparison exam with  SUV max equal 14 Incidental CT findings: none CHEST: No hypermetabolic mediastinal or hilar nodes. No suspicious pulmonary nodules on the CT scan. Incidental CT findings: none ABDOMEN/PELVIS: No abnormal hypermetabolic activity within the liver, pancreas, adrenal glands, or spleen. No hypermetabolic lymph nodes in the abdomen or pelvis. Incidental CT findings: Atherosclerotic calcification of the aorta. Uterus and adnexa unremarkable. SKELETON: Lesion the proximal LEFT humerus is improved with SUV max equal 2.9 decreased from 5.2. Multiple posterior RIGHT and lateral RIGHT rib lesions are improved. For example posterior RIGHT rib lesion associated the fracture on image 85 with SUV max equal 3.1 compared SUV max equal 5.9. Potential posttraumatic lesions. No new lesions identified. Lytic lesion in the midthoracic spine without radiotracer activity. Incidental CT findings: none EXTREMITIES: No abnormal hypermetabolic activity in the lower extremities. Incidental CT findings:  Metabolic activity associated with the head of the LEFT fibula is favored posttraumatic. IMPRESSION: 1. Intense radiotracer activity associated with two LEFT neck lymph nodes similar to comparison exam. 2. Interval decrease in activity in LEFT humerus lesion. 3. 4. Stable decreased activity in several RIGHT rib lesions. Potential posttraumatic. 5. No new lesions identified by FDG PET imaging within the skeleton. 6. No plasmacytoma. Electronically Signed   By: Suzy Bouchard M.D.   On: 10/17/2021 16:41   CT Biopsy  Result Date: 10/20/2021 INDICATION: Multiple myeloma EXAM: CT GUIDED BONE MARROW ASPIRATION AND CORE BIOPSY MEDICATIONS: None. ANESTHESIA/SEDATION: Moderate (conscious) sedation was employed during this procedure. A total of 2 milligrams versed and 100 micrograms fentanyl were administered intravenously. The patient's level of consciousness and vital signs were monitored continuously by radiology nursing throughout the procedure  under my direct supervision. Total monitored sedation time: 40 minutes FLUOROSCOPY TIME:  CT dose was not reported. COMPLICATIONS: None immediate. Estimated blood loss: <5 mL PROCEDURE: Informed written consent was obtained from the patient after a thorough discussion of the procedural risks, benefits and alternatives. All questions were addressed. Maximal Sterile Barrier Technique was utilized including caps, mask, sterile gowns, sterile gloves, sterile drape, hand hygiene and skin antiseptic. A timeout was performed prior to the initiation of the procedure. The patient was positioned prone and non-contrast localization CT was performed of the pelvis to demonstrate the iliac marrow spaces. Maximal barrier sterile technique utilized including caps, mask, sterile gowns, sterile gloves, large sterile drape, hand hygiene, and chlorhexidine prep. Under sterile conditions and local anesthesia, an 11 gauge coaxial bone biopsy needle was advanced into the RIGHT iliac marrow space. Needle position was confirmed with CT imaging. Initially, bone marrow aspiration was performed. Next, the 11 gauge outer cannula was utilized to obtain a 1 iliac bone marrow core biopsy. Needle was removed. Hemostasis was obtained with compression. The patient tolerated the procedure well. Samples were prepared with the cytotechnologist. IMPRESSION: Successful CT-guided bone marrow aspiration and biopsy, as above. Michaelle Birks, MD [facility] Electronically Signed   By: Michaelle Birks M.D.   On: 10/20/2021 18:38   CT BONE MARROW BIOPSY & ASPIRATION  Result Date: 10/20/2021 INDICATION: Multiple myeloma EXAM: CT GUIDED BONE MARROW ASPIRATION AND CORE BIOPSY MEDICATIONS: None. ANESTHESIA/SEDATION: Moderate (conscious) sedation was employed during this procedure. A total of 2 milligrams versed and 100 micrograms fentanyl were administered intravenously. The patient's level of consciousness  and vital signs were monitored continuously by radiology nursing throughout the procedure under my direct supervision. Total monitored sedation time: 40 minutes FLUOROSCOPY TIME:  CT dose was not reported. COMPLICATIONS: None immediate. Estimated blood loss: <5 mL PROCEDURE: Informed written consent was obtained from the patient after a thorough discussion of the procedural risks, benefits and alternatives. All questions were addressed. Maximal Sterile Barrier Technique was utilized including caps, mask, sterile gowns, sterile gloves, sterile drape, hand hygiene and skin antiseptic. A timeout was performed prior to the initiation of the procedure. The patient was positioned prone and non-contrast localization CT was performed of the pelvis to demonstrate the iliac marrow spaces. Maximal barrier sterile technique utilized including caps, mask, sterile gowns, sterile gloves, large sterile drape, hand hygiene, and chlorhexidine prep. Under sterile conditions and local anesthesia, an 11 gauge coaxial bone biopsy needle was advanced into the RIGHT iliac marrow space. Needle position was confirmed with CT imaging. Initially, bone marrow aspiration was performed. Next, the 11 gauge outer cannula was utilized to obtain a 1  iliac bone marrow core biopsy. Needle was removed. Hemostasis was obtained with compression. The patient tolerated the procedure well. Samples were prepared with the cytotechnologist. IMPRESSION: Successful CT-guided bone marrow aspiration and biopsy, as above. Michaelle Birks, MD [facility] Electronically Signed   By: Michaelle Birks M.D.   On: 10/20/2021 18:38     ASSESSMENT & PLAN:   80 y.o. female with  1. Recently diagnosed Multiple Myeloma, RISS Stage III  Labs upon initial presentation from 12/08/18, blood counts are normal including WBC at 7.1k, HGB at 13.1, and PLT at 245k. Calcium normal at 10.3. Creatinine normal at 0.63. M spike at  0.5g. 12/13/18 Bone Scan revealed Multifocal uptake throughout the skeleton, consistent with diffuse metastatic disease. Primary tumor is not specified. 2. Uptake in the proximal right femur, consistent with lytic lesions. 3. Uptake in the ribs bilaterally as described. 4. Lesions in the proximal left humerus. 5. Diffuse uptake throughout the skull consistent with metastatic disease. 6. Right paramedian uptake at the manubrium.  12/13/18 CT Right Femur revealed Numerous lytic lesions involving the right femur and a lytic lesion in the left inferior pubic ramus. Overall appearance is most concerning for multiple myeloma  12/27/18 Pretreatment 24hour UPEP observed an M spike at 50m, and showed 197mtotal protein/day.  12/27/18 Pretreatment MMP revealed M Protein at 0.5g with IgG Lambda specificity. Kappa:Lambda light chain ratio at 0.13, with Lambda at 40.3. There is less abnormal protein and light chains than I would expect from 30% plasma cells, which suggests hypo-secretory or non-secretory neoplastic plasma cells. Will have an impact in assessing response. 01/05/19 PET/CT revealed Innumerable lytic lesions in the skeleton compatible with myeloma. Most of the larger lesions are hypermetabolic, for example including a left proximal humeral shaft lesion with maximum SUV of 8.1 and a 2.8 cm lesion in the left T9 vertebral body with maximum SUV 5.1. Most of the smaller lytic lesions, and some of the larger lesions, do not demonstrate accentuated metabolic activity. 2. 1.2 cm in short axis lymph node in the left parapharyngeal space is hypermetabolic with maximum SUV 11.8. I do not see a separate mass in the head and neck to give rise to this hypermetabolic lymph node. 3. Mosaic attenuation in the lower lobes, nonspecific possibly from air trapping. 4.  Aortic Atherosclerosis 5. Heterogeneous activity in the liver, making it hard to exclude small liver lesions. Consider hepatic protocol MRI with and without  contrast for definitive assessment. Nonobstructive right nephrolithiasis. Old granulomatous disease  01/06/19 Bone Marrow biopsy revealed interstitial increase in plasma cells (28% aspirate, 40% CD138 immunohistochemistry). Plasma cells negative for light chains consistent with a non or weakly secretory myeloma   01/06/19 Cytogenetics revealed 37% of cells with trisomy 11 or 11q deletion, and 40.5% of cells with 17p mutation  S/p 5 cycles of KRD treatment  05/31/19 BM Biopsy revealed mild atypical plasmacytosis at 5% with polytypic variation.   06/01/19 PET/CT revealed "Dominant lesion in the LEFT humerus is decreased significantly in metabolic activity. Additional hypermetabolic skeletal lytic lesions have decreased in metabolic activity or similar to comparison exam (01/05/2019). No evidence of disease progression. 2. Multiple additional lytic lesions do not have metabolic activity and unchanged. 3. No new skeletal lesions are identified. No soft tissue plasmacytoma identified. 4. Nodule / node in the LEFT parapharyngeal space which is intensely hypermetabolic not changed from prior. 5. New hypermetabolic LEFT lower lobe pulmonary nodule is indeterminate. Recommend close attention on follow-up 6. New obstructive  hydronephrosis of the RIGHT kidney related to RIGHT UPJ stone."  09/18/2019 BM Bx Report which revealed "Slightly hypercellular bone marrow for age with trilineage hematopoiesis and 1% plasma cells."  09/14/2019 PET/CT Whole Body Scan ([id number]) which revealed "1. There widespread tiny lytic lesions compatible with multiple myeloma. Index larger lesions are generally similar to the prior exam, with low-grade activity such as the left T9 vertebral body lesion with maximum SUV 4.5. Is mild increase in the activity associated with a mildly sclerotic left proximal humeral lesion, maximum SUV 4.8 (previously 3.5). 2. At the site of the prior left lower lobe nodule is currently more bandlike  thickening, with maximum SUV only 1.9, probably benign, continued surveillance of this region suggested. 3. There several small but hypermetabolic lymph nodes. This includes a left parapharyngeal space node measuring 1.0 cm with maximum SUV 12.3 (stable); a left level IB lymph node measuring 0.5 cm with maximum SUV 4.8 (slightly larger than prior); and a left inguinal lymph node measuring 0.7 cm in short axis with maximum SUV 6.4 (previously 0.5 cm with maximum SUV 0.6). Significance of these lymph nodes uncertain, surveillance is recommended. 4. New 5 mm left lower lobe subpleural nodule on image 32/8, not appreciably hypermetabolic, surveillance suggested. 5. Focal subcutaneous stranding along the left perineum measuring about 2.6 by 1.1 cm on image 221/4, maximum SUV 12.5. This was not present previously and is most likely inflammatory, although given the notable SUV, surveillance of this region is suggested. 6. Other imaging findings of potential clinical significance: Aortic Atherosclerosis (ICD10-I70.0). Coronary atherosclerosis. Old granulomatous disease. Mild right hydronephrosis due to a 7 mm right UPJ calculus. 2 mm right kidney upper pole nonobstructive renal calculus. Prominent stool throughout the colon favors constipation."  12/19/2019 Thoracic & Lumbar Spine MRI ([id number]) ([id number]) revealed "Suspected myeloma lesions at T9 and S1. No compression deformity or epidural disease.  01/18/2020 PET/CT ([id number]) which revealed "1. Stable lytic lesions throughout the skeleton. The larger lytic lesions which had mild metabolic activity on comparison exam now have background metabolic activity. No evidence of active myeloma. No evidence of progression multiple myeloma.  No plasmacytoma 3. Hypermetabolic nodules in the LEFT neck may be associated deep tissues of the LEFT parotid gland. 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  01/17/19 MRI Liver revealed Several appreciable liver lesions all have benign imaging characteristics. No MRI findings of metastatic involvement of the liver. 2. Scattered bony lesions corresponding to the lytic lesions seen at PET-CT, compatible with active myeloma. 3. Aortic Atherosclerosis.  Mild cardiomegaly. 4. Diffuse hepatic steatosis.   3. Left lower lobe pulmonary nodule First seen on 06/01/19 PET/CT PET/CT 03/12/1915: No hypermetabolic mediastinal or hilar nodes. No suspicious pulmonary nodules on the CT scan.  4. 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 Being managed conservatively as per patient's preference.  No symptoms from this currently.  PLAN: -Discussed pts Lab results today 10/23/2021 CBC with hemoglobin of 10.9, WBC count of 2.7k with ANC of 1100, platelets of 65k CMP stable creatinine 1.1  Her bone marrow biopsy done on 10/20/2021 showed less than 1% plasma cells.  She was noted to have some dyspoietic changes in the erythroid precursors which were thought to be related to her treatment.  And recent PET CT scan on 10/17/2021  showed  1. Intense radiotracer activity associated with two LEFT neck lymph nodes similar to comparison exam. 2. Interval decrease in activity in LEFT humerus lesion. 3. 4. Stable decreased activity in several RIGHT rib lesions. Potential posttraumatic. 5. No new lesions identified by FDG PET imaging within the skeleton. 6. No plasmacytoma.  Mol;Cy-no evidence of residual TP53 mutation. Patient is glad that she has no overt evidence of myeloma progression on her bone marrow biopsy or PET CT scan. -Will continue Carflizomib at 56 mg/m^2 q2weeks for maintenance. -if progressive thrombocytopenia or leukopenia will need to lower  dose of Revlimid further. -Continue 2.5 Eliquis BID for VTE prophylaxis -Continue Potassium daily.  -Continue Zometa q4weeks -Will see back in 2 months with labs.   FOLLOW UP: Please schedule next 3 cycles [6 doses] of carfilzomib with port flush and labs. Zometa q4weeks x 6 MD visit in 6 weeks [place] today   All of the patient's questions were answered with apparent satisfaction. The patient knows to call the clinic with any problems, questions or concerns.  . The total time spent in the appointment was 32 minutes and more than 50% was on counseling and direct patient cares.    Sullivan Lone MD [place] AAHIVMS [facility] Hematology/Oncology Physician [facility]

## 2021-11-03 ENCOUNTER — Other Ambulatory Visit: Payer: Self-pay

## 2021-11-03 DIAGNOSIS — C9000 Multiple myeloma not having achieved remission: Secondary | ICD-10-CM

## 2021-11-04 ENCOUNTER — Encounter: Payer: Self-pay | Admitting: Hematology

## 2021-11-04 MED ORDER — FENTANYL 12 MCG/HR TD PT72: 1.0000 | MEDICATED_PATCH | TRANSDERMAL | 0 refills | Status: DC

## 2021-11-05 ENCOUNTER — Other Ambulatory Visit: Payer: Self-pay

## 2021-11-05 DIAGNOSIS — C9000 Multiple myeloma not having achieved remission: Secondary | ICD-10-CM

## 2021-11-05 MED ORDER — LENALIDOMIDE 10 MG PO CAPS: ORAL_CAPSULE | ORAL | 0 refills | Status: DC

## 2021-11-10 ENCOUNTER — Other Ambulatory Visit: Payer: Self-pay | Admitting: Hematology

## 2021-11-10 DIAGNOSIS — Z7189 Other specified counseling: Secondary | ICD-10-CM

## 2021-11-10 DIAGNOSIS — C9000 Multiple myeloma not having achieved remission: Secondary | ICD-10-CM

## 2021-11-13 ENCOUNTER — Other Ambulatory Visit: Payer: Self-pay

## 2021-11-13 ENCOUNTER — Other Ambulatory Visit: Payer: Self-pay | Admitting: Hematology

## 2021-11-13 ENCOUNTER — Inpatient Hospital Stay: Payer: Medicare PPO

## 2021-11-13 ENCOUNTER — Inpatient Hospital Stay: Payer: Medicare PPO | Attending: Hematology

## 2021-11-13 VITALS — BP 140/70 | HR 64 | Temp 98.0°F | Resp 18 | Wt 149.5 lb

## 2021-11-13 DIAGNOSIS — Z298 Encounter for other specified prophylactic measures: Secondary | ICD-10-CM | POA: Insufficient documentation

## 2021-11-13 DIAGNOSIS — Z79899 Other long term (current) drug therapy: Secondary | ICD-10-CM | POA: Diagnosis not present

## 2021-11-13 DIAGNOSIS — Z95828 Presence of other vascular implants and grafts: Secondary | ICD-10-CM

## 2021-11-13 DIAGNOSIS — Z5112 Encounter for antineoplastic immunotherapy: Secondary | ICD-10-CM | POA: Diagnosis not present

## 2021-11-13 DIAGNOSIS — Z7189 Other specified counseling: Secondary | ICD-10-CM

## 2021-11-13 DIAGNOSIS — C9000 Multiple myeloma not having achieved remission: Secondary | ICD-10-CM | POA: Diagnosis not present

## 2021-11-13 LAB — CBC WITH DIFFERENTIAL (CANCER CENTER ONLY)
Abs Immature Granulocytes: 0 10*3/uL (ref 0.00–0.07)
Basophils Absolute: 0 10*3/uL (ref 0.0–0.1)
Basophils Relative: 1 %
Eosinophils Absolute: 0 10*3/uL (ref 0.0–0.5)
Eosinophils Relative: 2 %
HCT: 35.1 % — ABNORMAL LOW (ref 36.0–46.0)
Hemoglobin: 11.6 g/dL — ABNORMAL LOW (ref 12.0–15.0)
Immature Granulocytes: 0 %
Lymphocytes Relative: 27 %
Lymphs Abs: 0.6 10*3/uL — ABNORMAL LOW (ref 0.7–4.0)
MCH: 35 pg — ABNORMAL HIGH (ref 26.0–34.0)
MCHC: 33 g/dL (ref 30.0–36.0)
MCV: 106 fL — ABNORMAL HIGH (ref 80.0–100.0)
Monocytes Absolute: 0.5 10*3/uL (ref 0.1–1.0)
Monocytes Relative: 22 %
Neutro Abs: 1 10*3/uL — ABNORMAL LOW (ref 1.7–7.7)
Neutrophils Relative %: 48 %
Platelet Count: 97 10*3/uL — ABNORMAL LOW (ref 150–400)
RBC: 3.31 MIL/uL — ABNORMAL LOW (ref 3.87–5.11)
RDW: 14.9 % (ref 11.5–15.5)
WBC Count: 2.1 10*3/uL — ABNORMAL LOW (ref 4.0–10.5)
nRBC: 0 % (ref 0.0–0.2)

## 2021-11-13 LAB — CMP (CANCER CENTER ONLY)
ALT: 14 U/L (ref 0–44)
AST: 12 U/L — ABNORMAL LOW (ref 15–41)
Albumin: 3.3 g/dL — ABNORMAL LOW (ref 3.5–5.0)
Alkaline Phosphatase: 115 U/L (ref 38–126)
Anion gap: 11 (ref 5–15)
BUN: 9 mg/dL (ref 8–23)
CO2: 15 mmol/L — ABNORMAL LOW (ref 22–32)
Calcium: 8.7 mg/dL — ABNORMAL LOW (ref 8.9–10.3)
Chloride: 112 mmol/L — ABNORMAL HIGH (ref 98–111)
Creatinine: 1.06 mg/dL — ABNORMAL HIGH (ref 0.44–1.00)
GFR, Estimated: 54 mL/min — ABNORMAL LOW (ref >60)
Glucose, Bld: 252 mg/dL — ABNORMAL HIGH (ref 70–99)
Potassium: 3.9 mmol/L (ref 3.5–5.1)
Sodium: 138 mmol/L (ref 135–145)
Total Bilirubin: 0.8 mg/dL (ref 0.3–1.2)
Total Protein: 5.9 g/dL — ABNORMAL LOW (ref 6.5–8.1)

## 2021-11-13 MED ORDER — SODIUM CHLORIDE 0.9 % IV SOLN: Freq: Once | INTRAVENOUS | Status: AC

## 2021-11-13 MED ORDER — CILGAVIMAB (PART OF EVUSHELD) INJECTION
300.0000 mg | Freq: Once | INTRAMUSCULAR | Status: AC
  Administered 2021-11-13: 300 mg via INTRAMUSCULAR
  Filled 2021-11-13: qty 3

## 2021-11-13 MED ORDER — ACETAMINOPHEN 325 MG PO TABS
650.0000 mg | ORAL_TABLET | Freq: Once | ORAL | Status: AC
  Administered 2021-11-13: 650 mg via ORAL
  Filled 2021-11-13: qty 2

## 2021-11-13 MED ORDER — DEXTROSE 5 % IV SOLN
56.0000 mg/m2 | Freq: Once | INTRAVENOUS | Status: AC
  Administered 2021-11-13: 100 mg via INTRAVENOUS
  Filled 2021-11-13: qty 30

## 2021-11-13 MED ORDER — FAMOTIDINE 20 MG PO TABS
20.0000 mg | ORAL_TABLET | Freq: Once | ORAL | Status: AC
  Administered 2021-11-13: 20 mg via ORAL
  Filled 2021-11-13: qty 1

## 2021-11-13 MED ORDER — HEPARIN SOD (PORK) LOCK FLUSH 100 UNIT/ML IV SOLN
500.0000 [IU] | Freq: Once | INTRAVENOUS | Status: AC | PRN
  Administered 2021-11-13: 500 [IU]

## 2021-11-13 MED ORDER — PROCHLORPERAZINE MALEATE 10 MG PO TABS
10.0000 mg | ORAL_TABLET | Freq: Once | ORAL | Status: AC
  Administered 2021-11-13: 10 mg via ORAL
  Filled 2021-11-13: qty 1

## 2021-11-13 MED ORDER — ZOLEDRONIC ACID 4 MG/100ML IV SOLN
4.0000 mg | Freq: Once | INTRAVENOUS | Status: AC
  Administered 2021-11-13: 4 mg via INTRAVENOUS

## 2021-11-13 MED ORDER — SODIUM CHLORIDE 0.9% FLUSH
10.0000 mL | INTRAVENOUS | Status: DC | PRN
  Administered 2021-11-13: 10 mL

## 2021-11-13 MED ORDER — DEXAMETHASONE 4 MG PO TABS
12.0000 mg | ORAL_TABLET | Freq: Once | ORAL | Status: AC
  Administered 2021-11-13: 12 mg via ORAL
  Filled 2021-11-13: qty 3

## 2021-11-13 MED ORDER — DIPHENHYDRAMINE HCL 25 MG PO CAPS
25.0000 mg | ORAL_CAPSULE | Freq: Once | ORAL | Status: AC
  Administered 2021-11-13: 25 mg via ORAL
  Filled 2021-11-13: qty 1

## 2021-11-13 MED ORDER — SODIUM CHLORIDE 0.9% FLUSH
10.0000 mL | Freq: Once | INTRAVENOUS | Status: AC
  Administered 2021-11-13: 10 mL

## 2021-11-13 MED ORDER — TIXAGEVIMAB (PART OF EVUSHELD) INJECTION
300.0000 mg | Freq: Once | INTRAMUSCULAR | Status: AC
  Administered 2021-11-13: 300 mg via INTRAMUSCULAR
  Filled 2021-11-13: qty 3

## 2021-11-13 NOTE — Patient Instructions (Signed)
[name]  Discharge Instructions: Thank you for choosing [facility] to provide your oncology and hematology care.   If you have a lab appointment with the [facility], please go directly to the [facility] and check in at the registration area.   Wear comfortable clothing and clothing appropriate for easy access to any Portacath or PICC line.   We strive to give you quality time with your provider. You may need to reschedule your appointment if you arrive late (15 or more minutes).  Arriving late affects you and other patients whose appointments are after yours.  Also, if you miss three or more appointments without notifying the office, you may be dismissed from the clinic at the provider's discretion.      For prescription refill requests, have your pharmacy contact our office and allow 72 hours for refills to be completed.    Today you received the following chemotherapy and/or immunotherapy agents kyprolis      To help prevent nausea and vomiting after your treatment, we encourage you to take your nausea medication as directed.  BELOW ARE SYMPTOMS THAT SHOULD BE REPORTED IMMEDIATELY: *FEVER GREATER THAN 100.4 F (38 C) OR HIGHER *CHILLS OR SWEATING *NAUSEA AND VOMITING THAT IS NOT CONTROLLED WITH YOUR NAUSEA MEDICATION *UNUSUAL SHORTNESS OF BREATH *UNUSUAL BRUISING OR BLEEDING *URINARY PROBLEMS (pain or burning when urinating, or frequent urination) *BOWEL PROBLEMS (unusual diarrhea, constipation, pain near the anus) TENDERNESS IN MOUTH AND THROAT WITH OR WITHOUT PRESENCE OF ULCERS (sore throat, sores in mouth, or a toothache) UNUSUAL RASH, SWELLING OR PAIN  UNUSUAL VAGINAL DISCHARGE OR ITCHING   Items with * indicate a potential emergency and should be followed up as soon as possible or go to the Emergency Department if any problems should occur.  Please show the CHEMOTHERAPY ALERT CARD or IMMUNOTHERAPY ALERT CARD at check-in to  the Emergency Department and triage nurse.  Should you have questions after your visit or need to cancel or reschedule your appointment, please contact [facility]  Dept: [phone]  and follow the prompts.  Office hours are 8:00 a.m. to 4:30 p.m. Monday - Friday. Please note that voicemails left after 4:00 p.m. may not be returned until the following business day.  We are closed weekends and major holidays. You have access to a nurse at all times for urgent questions. Please call the main number to the clinic Dept: [phone] and follow the prompts.   For any non-urgent questions, you may also contact your provider using MyChart. We now offer e-Visits for anyone 62 and older to request care online for non-urgent symptoms. For details visit [email or web address].   Also download the MyChart app! Go to the app store, search "MyChart", open the app, select [facility], and log in with your MyChart username and password.  Due to Covid, a mask is required upon entering the hospital/clinic. If you do not have a mask, one will be given to you upon arrival. For doctor visits, patients may have 1 support person aged 22 or older with them. For treatment visits, patients cannot have anyone with them due to current Covid guidelines and our immunocompromised population.  Zoledronic Acid Injection (Hypercalcemia, Oncology) What is this medication? ZOLEDRONIC ACID (ZOE le dron ik AS id) slows calcium loss from bones. It high calcium levels in the blood from some kinds of cancer. It may be used in other people at risk for bone loss. This medicine may be  used for other purposes; ask your health care provider or pharmacist if you have questions. COMMON BRAND NAME(S): Zometa What should I tell my care team before I take this medication? They need to know if you have any of these conditions: cancer dehydration dental disease kidney disease liver disease low levels of calcium  in the blood lung or breathing disease (asthma) receiving steroids like dexamethasone or prednisone an unusual or allergic reaction to zoledronic acid, other medicines, foods, dyes, or preservatives pregnant or trying to get pregnant breast-feeding How should I use this medication? This drug is injected into a vein. It is given by a health care provider in a hospital or clinic setting. Talk to your health care provider about the use of this drug in children. Special care may be needed. Overdosage: If you think you have taken too much of this medicine contact a poison control center or emergency room at once. NOTE: This medicine is only for you. Do not share this medicine with others. What if I miss a dose? Keep appointments for follow-up doses. It is important not to miss your dose. Call your health care provider if you are unable to keep an appointment. What may interact with this medication? certain antibiotics given by injection NSAIDs, medicines for pain and inflammation, like ibuprofen or naproxen some diuretics like bumetanide, furosemide teriparatide thalidomide This list may not describe all possible interactions. Give your health care provider a list of all the medicines, herbs, non-prescription drugs, or dietary supplements you use. Also tell them if you smoke, drink alcohol, or use illegal drugs. Some items may interact with your medicine. What should I watch for while using this medication? Visit your health care provider for regular checks on your progress. It may be some time before you see the benefit from this drug. Some people who take this drug have severe bone, joint, or muscle pain. This drug may also increase your risk for jaw problems or a broken thigh bone. Tell your health care provider right away if you have severe pain in your jaw, bones, joints, or muscles. Tell you health care provider if you have any pain that does not go away or that gets worse. Tell your dentist  and dental surgeon that you are taking this drug. You should not have major dental surgery while on this drug. See your dentist to have a dental exam and fix any dental problems before starting this drug. Take good care of your teeth while on this drug. Make sure you see your dentist for regular follow-up appointments. You should make sure you get enough calcium and vitamin D while you are taking this drug. Discuss the foods you eat and the vitamins you take with your health care provider. Check with your health care provider if you have severe diarrhea, nausea, and vomiting, or if you sweat a lot. The loss of too much body fluid may make it dangerous for you to take this drug. You may need blood work done while you are taking this drug. Do not become pregnant while taking this drug. Women should inform their health care provider if they wish to become pregnant or think they might be pregnant. There is potential for serious harm to an unborn child. Talk to your health care provider for more information. What side effects may I notice from receiving this medication? Side effects that you should report to your doctor or health care provider as soon as possible: allergic reactions (skin rash, itching or hives; swelling  of the face, lips, or tongue) bone pain infection (fever, chills, cough, sore throat, pain or trouble passing urine) jaw pain, especially after dental work joint pain kidney injury (trouble passing urine or change in the amount of urine) low blood pressure (dizziness; feeling faint or lightheaded, falls; unusually weak or tired) low calcium levels (fast heartbeat; muscle cramps or pain; pain, tingling, or numbness in the hands or feet; seizures) low magnesium levels (fast, irregular heartbeat; muscle cramp or pain; muscle weakness; tremors; seizures) low red blood cell counts (trouble breathing; feeling faint; lightheaded, falls; unusually weak or tired) muscle pain redness, blistering,  peeling, or loosening of the skin, including inside the mouth severe diarrhea swelling of the ankles, feet, hands trouble breathing Side effects that usually do not require medical attention (report to your doctor or health care provider if they continue or are bothersome): anxious constipation coughing depressed mood eye irritation, itching, or pain fever general ill feeling or flu-like symptoms nausea pain, redness, or irritation at site where injected trouble sleeping This list may not describe all possible side effects. Call your doctor for medical advice about side effects. You may report side effects to FDA at 1-800-FDA-1088. Where should I keep my medication? This drug is given in a hospital or clinic. It will not be stored at home. NOTE: This sheet is a summary. It may not cover all possible information. If you have questions about this medicine, talk to your doctor, pharmacist, or health care provider.  2022 Elsevier/Gold Standard (2021-08-19 00:00:00)]

## 2021-11-13 NOTE — Progress Notes (Signed)
Ok to treat with labs today per provider

## 2021-11-17 ENCOUNTER — Other Ambulatory Visit: Payer: Self-pay | Admitting: Hematology

## 2021-11-17 DIAGNOSIS — C9000 Multiple myeloma not having achieved remission: Secondary | ICD-10-CM

## 2021-11-18 ENCOUNTER — Other Ambulatory Visit: Payer: Self-pay

## 2021-11-19 ENCOUNTER — Other Ambulatory Visit: Payer: Self-pay

## 2021-11-19 ENCOUNTER — Encounter: Payer: Self-pay | Admitting: Hematology

## 2021-11-19 DIAGNOSIS — C9000 Multiple myeloma not having achieved remission: Secondary | ICD-10-CM

## 2021-11-20 ENCOUNTER — Other Ambulatory Visit: Payer: Self-pay | Admitting: Hematology

## 2021-11-20 ENCOUNTER — Inpatient Hospital Stay: Payer: Medicare PPO

## 2021-11-20 ENCOUNTER — Encounter: Payer: Self-pay | Admitting: Hematology

## 2021-11-20 ENCOUNTER — Other Ambulatory Visit: Payer: Self-pay

## 2021-11-20 VITALS — BP 110/55 | HR 79 | Temp 98.1°F | Resp 18 | Wt 150.5 lb

## 2021-11-20 DIAGNOSIS — Z5112 Encounter for antineoplastic immunotherapy: Secondary | ICD-10-CM | POA: Diagnosis not present

## 2021-11-20 DIAGNOSIS — C9000 Multiple myeloma not having achieved remission: Secondary | ICD-10-CM

## 2021-11-20 DIAGNOSIS — Z7189 Other specified counseling: Secondary | ICD-10-CM

## 2021-11-20 DIAGNOSIS — Z95828 Presence of other vascular implants and grafts: Secondary | ICD-10-CM

## 2021-11-20 DIAGNOSIS — Z79899 Other long term (current) drug therapy: Secondary | ICD-10-CM | POA: Diagnosis not present

## 2021-11-20 DIAGNOSIS — Z298 Encounter for other specified prophylactic measures: Secondary | ICD-10-CM | POA: Diagnosis not present

## 2021-11-20 LAB — CMP (CANCER CENTER ONLY)
ALT: 11 U/L (ref 0–44)
AST: 9 U/L — ABNORMAL LOW (ref 15–41)
Albumin: 3.4 g/dL — ABNORMAL LOW (ref 3.5–5.0)
Alkaline Phosphatase: 101 U/L (ref 38–126)
Anion gap: 11 (ref 5–15)
BUN: 16 mg/dL (ref 8–23)
CO2: 17 mmol/L — ABNORMAL LOW (ref 22–32)
Calcium: 8.6 mg/dL — ABNORMAL LOW (ref 8.9–10.3)
Chloride: 111 mmol/L (ref 98–111)
Creatinine: 1.03 mg/dL — ABNORMAL HIGH (ref 0.44–1.00)
GFR, Estimated: 56 mL/min — ABNORMAL LOW (ref >60)
Glucose, Bld: 226 mg/dL — ABNORMAL HIGH (ref 70–99)
Potassium: 3.7 mmol/L (ref 3.5–5.1)
Sodium: 139 mmol/L (ref 135–145)
Total Bilirubin: 0.7 mg/dL (ref 0.3–1.2)
Total Protein: 6.2 g/dL — ABNORMAL LOW (ref 6.5–8.1)

## 2021-11-20 LAB — CBC WITH DIFFERENTIAL (CANCER CENTER ONLY)
Abs Immature Granulocytes: 0.02 10*3/uL (ref 0.00–0.07)
Basophils Absolute: 0 10*3/uL (ref 0.0–0.1)
Basophils Relative: 1 %
Eosinophils Absolute: 0 10*3/uL (ref 0.0–0.5)
Eosinophils Relative: 1 %
HCT: 33.8 % — ABNORMAL LOW (ref 36.0–46.0)
Hemoglobin: 11.3 g/dL — ABNORMAL LOW (ref 12.0–15.0)
Immature Granulocytes: 1 %
Lymphocytes Relative: 27 %
Lymphs Abs: 0.8 10*3/uL (ref 0.7–4.0)
MCH: 35.3 pg — ABNORMAL HIGH (ref 26.0–34.0)
MCHC: 33.4 g/dL (ref 30.0–36.0)
MCV: 105.6 fL — ABNORMAL HIGH (ref 80.0–100.0)
Monocytes Absolute: 0.6 10*3/uL (ref 0.1–1.0)
Monocytes Relative: 22 %
Neutro Abs: 1.3 10*3/uL — ABNORMAL LOW (ref 1.7–7.7)
Neutrophils Relative %: 48 %
Platelet Count: 75 10*3/uL — ABNORMAL LOW (ref 150–400)
RBC: 3.2 MIL/uL — ABNORMAL LOW (ref 3.87–5.11)
RDW: 14.5 % (ref 11.5–15.5)
WBC Count: 2.8 10*3/uL — ABNORMAL LOW (ref 4.0–10.5)
nRBC: 0 % (ref 0.0–0.2)

## 2021-11-20 MED ORDER — SODIUM CHLORIDE 0.9 % IV SOLN: Freq: Once | INTRAVENOUS | Status: AC

## 2021-11-20 MED ORDER — ACETAMINOPHEN 325 MG PO TABS
650.0000 mg | ORAL_TABLET | Freq: Once | ORAL | Status: AC
  Administered 2021-11-20: 650 mg via ORAL
  Filled 2021-11-20: qty 2

## 2021-11-20 MED ORDER — OXYCODONE HCL 10 MG PO TABS: 10.0000 mg | ORAL_TABLET | Freq: Four times a day (QID) | ORAL | 0 refills | Status: DC | PRN

## 2021-11-20 MED ORDER — DEXAMETHASONE 4 MG PO TABS
12.0000 mg | ORAL_TABLET | Freq: Once | ORAL | Status: AC
  Administered 2021-11-20: 12 mg via ORAL
  Filled 2021-11-20: qty 3

## 2021-11-20 MED ORDER — SODIUM CHLORIDE 0.9% FLUSH
10.0000 mL | INTRAVENOUS | Status: DC | PRN
  Administered 2021-11-20: 10 mL

## 2021-11-20 MED ORDER — HEPARIN SOD (PORK) LOCK FLUSH 100 UNIT/ML IV SOLN
500.0000 [IU] | Freq: Once | INTRAVENOUS | Status: AC | PRN
  Administered 2021-11-20: 500 [IU]

## 2021-11-20 MED ORDER — DIPHENHYDRAMINE HCL 25 MG PO CAPS
25.0000 mg | ORAL_CAPSULE | Freq: Once | ORAL | Status: AC
  Administered 2021-11-20: 25 mg via ORAL
  Filled 2021-11-20: qty 1

## 2021-11-20 MED ORDER — PROCHLORPERAZINE MALEATE 10 MG PO TABS
10.0000 mg | ORAL_TABLET | Freq: Once | ORAL | Status: AC
  Administered 2021-11-20: 10 mg via ORAL
  Filled 2021-11-20: qty 1

## 2021-11-20 MED ORDER — DEXTROSE 5 % IV SOLN
56.0000 mg/m2 | Freq: Once | INTRAVENOUS | Status: AC
  Administered 2021-11-20: 100 mg via INTRAVENOUS
  Filled 2021-11-20: qty 30

## 2021-11-20 MED ORDER — SODIUM CHLORIDE 0.9% FLUSH
10.0000 mL | Freq: Once | INTRAVENOUS | Status: AC
  Administered 2021-11-20: 10 mL

## 2021-11-20 MED ORDER — FAMOTIDINE 20 MG PO TABS
20.0000 mg | ORAL_TABLET | Freq: Once | ORAL | Status: AC
  Administered 2021-11-20: 20 mg via ORAL
  Filled 2021-11-20: qty 1

## 2021-11-20 NOTE — Progress Notes (Signed)
Pt ok to tx today with ANC 1.3 and plts 75. MD aware of lab values.

## 2021-11-20 NOTE — Patient Instructions (Signed)
[facility]  Discharge Instructions: Thank you for choosing [facility] to provide your oncology and hematology care.   If you have a lab appointment with the Cancer Center, please go directly to the Cancer Center and check in at the registration area.   Wear comfortable clothing and clothing appropriate for easy access to any Portacath or PICC line.   We strive to give you quality time with your provider. You may need to reschedule your appointment if you arrive late (15 or more minutes).  Arriving late affects you and other patients whose appointments are after yours.  Also, if you miss three or more appointments without notifying the office, you may be dismissed from the clinic at the provider's discretion.      For prescription refill requests, have your pharmacy contact our office and allow 72 hours for refills to be completed.    Today you received the following chemotherapy and/or immunotherapy agents: Kyprolis    To help prevent nausea and vomiting after your treatment, we encourage you to take your nausea medication as directed.  BELOW ARE SYMPTOMS THAT SHOULD BE REPORTED IMMEDIATELY: . *FEVER GREATER THAN 100.4 F (38 C) OR HIGHER . *CHILLS OR SWEATING . *NAUSEA AND VOMITING THAT IS NOT CONTROLLED WITH YOUR NAUSEA MEDICATION . *UNUSUAL SHORTNESS OF BREATH . *UNUSUAL BRUISING OR BLEEDING . *URINARY PROBLEMS (pain or burning when urinating, or frequent urination) . *BOWEL PROBLEMS (unusual diarrhea, constipation, pain near the anus) . TENDERNESS IN MOUTH AND THROAT WITH OR WITHOUT PRESENCE OF ULCERS (sore throat, sores in mouth, or a toothache) . UNUSUAL RASH, SWELLING OR PAIN  . UNUSUAL VAGINAL DISCHARGE OR ITCHING   Items with * indicate a potential emergency and should be followed up as soon as possible or go to the Emergency Department if any problems should occur.  Please show the CHEMOTHERAPY ALERT CARD or IMMUNOTHERAPY ALERT  CARD at check-in to the Emergency Department and triage nurse.  Should you have questions after your visit or need to cancel or reschedule your appointment, please contact [facility]  Dept: [phone]  and follow the prompts.  Office hours are 8:00 a.m. to 4:30 p.m. Monday - Friday. Please note that voicemails left after 4:00 p.m. may not be returned until the following business day.  We are closed weekends and major holidays. You have access to a nurse at all times for urgent questions. Please call the main number to the clinic Dept: [phone] and follow the prompts.   For any non-urgent questions, you may also contact your provider using MyChart. We now offer e-Visits for anyone 18 and older to request care online for non-urgent symptoms. For details visit [email or web address].   Also download the MyChart app! Go to the app store, search "MyChart", open the app, select [facility], and log in with your MyChart username and password.  Due to Covid, a mask is required upon entering the hospital/clinic. If you do not have a mask, one will be given to you upon arrival. For doctor visits, patients may have 1 support person aged 18 or older with them. For treatment visits, patients cannot have anyone with them due to current Covid guidelines and our immunocompromised population.   

## 2021-12-02 ENCOUNTER — Other Ambulatory Visit: Payer: Self-pay | Admitting: Hematology

## 2021-12-02 DIAGNOSIS — C9000 Multiple myeloma not having achieved remission: Secondary | ICD-10-CM

## 2021-12-03 ENCOUNTER — Other Ambulatory Visit: Payer: Self-pay

## 2021-12-03 ENCOUNTER — Encounter: Payer: Self-pay | Admitting: Hematology

## 2021-12-07 ENCOUNTER — Encounter: Payer: Self-pay | Admitting: Internal Medicine

## 2021-12-09 ENCOUNTER — Encounter: Payer: Self-pay | Admitting: Internal Medicine

## 2021-12-09 ENCOUNTER — Telehealth (INDEPENDENT_AMBULATORY_CARE_PROVIDER_SITE_OTHER): Payer: Medicare PPO | Admitting: Internal Medicine

## 2021-12-09 ENCOUNTER — Telehealth: Payer: Self-pay | Admitting: Internal Medicine

## 2021-12-09 ENCOUNTER — Other Ambulatory Visit: Payer: Self-pay

## 2021-12-09 ENCOUNTER — Telehealth: Payer: Self-pay | Admitting: *Deleted

## 2021-12-09 DIAGNOSIS — J4520 Mild intermittent asthma, uncomplicated: Secondary | ICD-10-CM

## 2021-12-09 DIAGNOSIS — E118 Type 2 diabetes mellitus with unspecified complications: Secondary | ICD-10-CM | POA: Diagnosis not present

## 2021-12-09 DIAGNOSIS — R059 Cough, unspecified: Secondary | ICD-10-CM | POA: Insufficient documentation

## 2021-12-09 DIAGNOSIS — R051 Acute cough: Secondary | ICD-10-CM

## 2021-12-09 MED ORDER — LEVOFLOXACIN 500 MG PO TABS: 500.0000 mg | ORAL_TABLET | Freq: Every day | ORAL | 0 refills | Status: AC

## 2021-12-09 NOTE — Assessment & Plan Note (Signed)
Lab Results  Component Value Date   HGBA1C 5.8 (H) 06/22/2019   Stable, pt to continue current medical treatment glucotrol, metformin

## 2021-12-09 NOTE — Telephone Encounter (Signed)
Patient is feeling poorly and wants to cancel and reschedule her visit this Thursday.  She is following up with PCP to see if she has the flu.  She is already scheduled for January 5th.

## 2021-12-09 NOTE — Telephone Encounter (Signed)
Patient calling in  Had virtual visit w/ provider today.. provider advised patient to take OTC Delsym  Patient wants to know if there is any specific kind of Delsym she should be taking as far as regular or DM  Please clarify & call patient [phone]

## 2021-12-09 NOTE — Progress Notes (Signed)
Patient ID: Faith Orr, female   DOB: [birth year], 80 y.o.   MRN: [id number]    Virtual Visit via Video Note  I connected with Kem Kays on 12/09/21 at  4:00 PM EST by a video enabled telemedicine application and verified that I am speaking with the correct person using two identifiers.  Location of all participants today Patient: at home with female partner Provider: at office   I discussed the limitations of evaluation and management by telemedicine and the availability of in person appointments. The patient expressed understanding and agreed to proceed.  History of Present Illness: Here with acute onset mild to mod 8 days ST, HA, general weakness and malaise, with prod cough greenish sputum, but Pt denies chest pain, increased sob or doe, wheezing, orthopnea, PND, increased LE swelling, palpitations, dizziness or syncope.  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  Home testing neg for covid yesterday.  Pt agrees with this assessment and knows she is at home and the date.   Pt denies polydipsia, polyuria, or new focal neuro s/s.    Past Medical History:  Diagnosis Date   Allergy    seasonal   Asthma    DEPRESSION    DIABETES MELLITUS, TYPE II    Diverticulosis    HYPERLIPIDEMIA    Macular degeneration of left eye    mild, Dr.Hecker   Obesity, unspecified    Osteoarthritis of both knees    OSTEOPENIA    Osteopenia    URINARY INCONTINENCE    Past Surgical History:  Procedure Laterality Date   CATARACT EXTRACTION Left 05/24/2018   CESAREAN SECTION  01/1973   CYSTOSCOPY/URETEROSCOPY/HOLMIUM LASER/STENT PLACEMENT Right 09/20/2019   Procedure: CYSTOSCOPY/URETEROSCOPY/HOLMIUM LASER/STENT PLACEMENT;  Surgeon: Lucas Mallow, MD;  Location: [facility];  Service: Urology;  Laterality: Right;   FRACTURE SURGERY     IR IMAGING GUIDED PORT  INSERTION  02/20/2019   left wrist surgery  2008   By Dr. Latanya Maudlin   right ankle  1994    reports that she has never smoked. She has never used smokeless tobacco. She reports that she does not drink alcohol and does not use drugs. family history includes Cancer in her father and another family member; Colon cancer (age of onset: 4) in her paternal grandmother; Diabetes in her father; Heart disease in her father; Hyperlipidemia in her father and mother; Hypertension in her father; Multiple sclerosis in her daughter; Osteoporosis in her mother; Protein S deficiency in her mother. Allergies  Allergen Reactions   Penicillins Anaphylaxis    "serum sickness"   Aleve [Naproxen Sodium] Swelling    Swelling of face   Sulfonamide Derivatives Rash   Current Outpatient Medications on File Prior to Visit  Medication Sig Dispense Refill   lenalidomide (REVLIMID) 10 MG capsule TAKE 1 CAPSULE BY MOUTH DAILY  FOR 21 DAYS, THEN 7 DAYS OFF 21 capsule 0   acyclovir (ZOVIRAX) 400 MG tablet TAKE 1 TABLET(400 MG) BY MOUTH TWICE DAILY 60 tablet 5   Blood Glucose Monitoring Suppl (FREESTYLE FREEDOM LITE) W/DEVICE KIT Use to check blood sugars twice a day Dx 250.00 1 each 0   Calcium Carbonate-Vitamin D 600-400 MG-UNIT tablet Take 1 tablet by mouth 2 (two) times daily.     Cetirizine HCl 10 MG CAPS Take 1 capsule (10 mg total) by mouth daily. 30 capsule 1   clindamycin (  CLEOCIN) 300 MG capsule Take 2 capsules (600 mg total) by mouth once as needed for up to 1 dose (1 hour prior to dental procedures). 2 capsule 0   dexamethasone (DECADRON) 4 MG tablet Take 5 tablets (20 mg total) by mouth once a week. On D22 of each cycle of treatment 20 tablet 5   ELIQUIS 2.5 MG TABS tablet TAKE 1 TABLET(2.5 MG) BY MOUTH TWICE DAILY 60 tablet 2   fentaNYL (DURAGESIC) 12 MCG/HR Place 1 patch onto the skin every 3 (three) days. 10 patch 0   fluticasone (FLONASE) 50 MCG/ACT nasal spray Place 1 spray into both nostrils daily. 16 g 2    glipiZIDE (GLUCOTROL XL) 5 MG 24 hr tablet TAKE 1 TABLET(5 MG) BY MOUTH DAILY WITH BREAKFAST 90 tablet 1   glucose blood (FREESTYLE LITE) test strip CHECK BLOOD SUGAR TWICE DAILY AS DIRECTED Dx 250.00 180 each 3   Lancets (FREESTYLE) lancets Use twice daily to check sugars. 100 each 11   lidocaine-prilocaine (EMLA) cream APPLY 1 APPLICATION TO THE AFFECTED AREA AS NEEDED. USE PRIOR TO PORT ACCESS 30 g 0   metFORMIN (GLUCOPHAGE-XR) 500 MG 24 hr tablet TAKE 3 TABLETS(1500 MG) BY MOUTH DAILY WITH BREAKFAST 90 tablet 1   Multiple Vitamins-Minerals (ICAPS) CAPS Take 1 capsule by mouth daily after breakfast.     ondansetron (ZOFRAN) 8 MG tablet Take 1 tablet (8 mg total) by mouth 2 (two) times daily as needed (Nausea or vomiting). 30 tablet 1   Oxycodone HCl 10 MG TABS Take 1 tablet (10 mg total) by mouth every 6 (six) hours as needed. 90 tablet 0   pantoprazole (PROTONIX) 20 MG tablet TAKE 1 TABLET(20 MG) BY MOUTH DAILY 30 tablet 5   polyethylene glycol (MIRALAX / GLYCOLAX) packet Take 17 g by mouth daily after breakfast.     potassium chloride SA (KLOR-CON) 20 MEQ tablet Take 2 tablets (40 mEq total) by mouth 2 (two) times daily. TAKE 2 TABLET(20 MEQ) BY MOUTH TWICE DAILY 360 tablet 1   prochlorperazine (COMPAZINE) 10 MG tablet Take 1 tablet (10 mg total) by mouth every 6 (six) hours as needed (Nausea or vomiting). 30 tablet 1   REVLIMID 10 MG capsule TAKE 1 CAPSULE BY MOUTH  DAILY FOR 21 DAYS, THEN 7  DAYS OFF 21 capsule 0   senna-docusate (SENNA S) 8.6-50 MG tablet Take 2 tablets by mouth at bedtime. 60 tablet 2   sertraline (ZOLOFT) 50 MG tablet Take 1 tablet (50 mg total) by mouth daily. 90 tablet 3   simvastatin (ZOCOR) 20 MG tablet TAKE 1 TABLET BY MOUTH EVERY DAY AT 6 PM 90 tablet 3   Triamcinolone Acetonide 0.025 % LOTN Apply 1 application topically 3 (three) times daily as needed (rash/itching). 60 mL 2   Vitamin D, Ergocalciferol, (DRISDOL) 1.25 MG (50000 UNIT) CAPS capsule TAKE 1 CAPSULE BY  MOUTH EVERY 7 DAYS 12 capsule 0   Current Facility-Administered Medications on File Prior to Visit  Medication Dose Route Frequency Provider Last Rate Last Admin   heparin lock flush 100 unit/mL  500 Units Intracatheter Once PRN Brunetta Genera, MD       sodium chloride flush (NS) 0.9 % injection 10 mL  10 mL Intracatheter PRN Brunetta Genera, MD   10 mL at 10/02/19 1524    Observations/Objective: The patient will observe these symptoms, and report promptly any worsening or unexpected persistence.  If well, may return prn. Lab Results  Component Value Date   WBC  2.8 (L) 11/20/2021   HGB 11.3 (L) 11/20/2021   HCT 33.8 (L) 11/20/2021   PLT 75 (L) 11/20/2021   GLUCOSE 226 (H) 11/20/2021   CHOL 185 06/06/2018   TRIG 333.0 (H) 06/06/2018   HDL 47.40 06/06/2018   LDLDIRECT 98.0 06/06/2018   LDLCALC 107 (H) 05/28/2010   ALT 11 11/20/2021   AST 9 (L) 11/20/2021   NA 139 11/20/2021   K 3.7 11/20/2021   CL 111 11/20/2021   CREATININE 1.03 (H) 11/20/2021   BUN 16 11/20/2021   CO2 17 (L) 11/20/2021   TSH 1.12 06/06/2018   INR 1.0 04/28/2021   HGBA1C 5.8 (H) 06/22/2019   MICROALBUR 5.0 (H) 12/08/2018   Assessment and Plan: See notes  Follow Up Instructions: See notes   I discussed the assessment and treatment plan with the patient. The patient was provided an opportunity to ask questions and all were answered. The patient agreed with the plan and demonstrated an understanding of the instructions.   The patient was advised to call back or seek an in-person evaluation if the symptoms worsen or if the condition fails to improve as anticipated. \ Cathlean Cower, MD

## 2021-12-09 NOTE — Assessment & Plan Note (Signed)
Overall stable, no wheezing noted, cont current med tx including inhaler prn

## 2021-12-09 NOTE — Assessment & Plan Note (Signed)
Mild to mod, cant r/o brochitis vs pna, decines cxr and labs, for antibx course, delsym otc prn,  to f/u any worsening symptoms or concerns

## 2021-12-09 NOTE — Patient Instructions (Signed)
Please take all new medication as prescribed 

## 2021-12-09 NOTE — Telephone Encounter (Signed)
Spoke with patient and advised that regular delsym is okay. Patient verbalizes understanding

## 2021-12-11 ENCOUNTER — Emergency Department (HOSPITAL_COMMUNITY)
Admission: EM | Admit: 2021-12-11 | Discharge: 2021-12-12 | Disposition: A | Discharge status: Home / Self Care | Payer: Medicare PPO | Attending: Emergency Medicine | Admitting: Emergency Medicine

## 2021-12-11 ENCOUNTER — Other Ambulatory Visit: Payer: Medicare PPO

## 2021-12-11 ENCOUNTER — Other Ambulatory Visit: Payer: Self-pay

## 2021-12-11 ENCOUNTER — Emergency Department (HOSPITAL_COMMUNITY): Payer: Medicare PPO

## 2021-12-11 ENCOUNTER — Ambulatory Visit: Payer: Medicare PPO

## 2021-12-11 DIAGNOSIS — J189 Pneumonia, unspecified organism: Secondary | ICD-10-CM | POA: Diagnosis not present

## 2021-12-11 DIAGNOSIS — J188 Other pneumonia, unspecified organism: Secondary | ICD-10-CM | POA: Diagnosis not present

## 2021-12-11 DIAGNOSIS — J45909 Unspecified asthma, uncomplicated: Secondary | ICD-10-CM | POA: Insufficient documentation

## 2021-12-11 DIAGNOSIS — Z7984 Long term (current) use of oral hypoglycemic drugs: Secondary | ICD-10-CM | POA: Insufficient documentation

## 2021-12-11 DIAGNOSIS — Z7901 Long term (current) use of anticoagulants: Secondary | ICD-10-CM | POA: Diagnosis not present

## 2021-12-11 DIAGNOSIS — J101 Influenza due to other identified influenza virus with other respiratory manifestations: Secondary | ICD-10-CM | POA: Insufficient documentation

## 2021-12-11 DIAGNOSIS — Z7951 Long term (current) use of inhaled steroids: Secondary | ICD-10-CM | POA: Diagnosis not present

## 2021-12-11 DIAGNOSIS — Z20822 Contact with and (suspected) exposure to covid-19: Secondary | ICD-10-CM | POA: Diagnosis not present

## 2021-12-11 DIAGNOSIS — D72819 Decreased white blood cell count, unspecified: Secondary | ICD-10-CM | POA: Diagnosis not present

## 2021-12-11 DIAGNOSIS — R059 Cough, unspecified: Secondary | ICD-10-CM | POA: Diagnosis not present

## 2021-12-11 DIAGNOSIS — E119 Type 2 diabetes mellitus without complications: Secondary | ICD-10-CM | POA: Diagnosis not present

## 2021-12-11 LAB — BASIC METABOLIC PANEL
Anion gap: 10 (ref 5–15)
BUN: 16 mg/dL (ref 8–23)
CO2: 10 mmol/L — ABNORMAL LOW (ref 22–32)
Calcium: 9.3 mg/dL (ref 8.9–10.3)
Chloride: 114 mmol/L — ABNORMAL HIGH (ref 98–111)
Creatinine, Ser: 1.06 mg/dL — ABNORMAL HIGH (ref 0.44–1.00)
GFR, Estimated: 54 mL/min — ABNORMAL LOW (ref >60)
Glucose, Bld: 146 mg/dL — ABNORMAL HIGH (ref 70–99)
Potassium: 3.5 mmol/L (ref 3.5–5.1)
Sodium: 134 mmol/L — ABNORMAL LOW (ref 135–145)

## 2021-12-11 LAB — RESP PANEL BY RT-PCR (FLU A&B, COVID) ARPGX2
Influenza A by PCR: POSITIVE — AB
Influenza B by PCR: NEGATIVE
SARS Coronavirus 2 by RT PCR: NEGATIVE

## 2021-12-11 MED ORDER — ONDANSETRON 4 MG PO TBDP
4.0000 mg | ORAL_TABLET | Freq: Once | ORAL | Status: AC
  Administered 2021-12-11: 23:00:00 4 mg via ORAL
  Filled 2021-12-11: qty 1

## 2021-12-11 NOTE — ED Triage Notes (Signed)
Patient c/o weakness and non productive cough x2 weeks. Pt report on and off fever 2 days ago. Pt denies N/V/D. Pt a/ox4.

## 2021-12-11 NOTE — ED Notes (Signed)
Patient ambulated around the room. O2 sats remained in the mid to upper 90's. While ambulating, the patient reported feeling lightheaded.

## 2021-12-12 ENCOUNTER — Encounter: Payer: Self-pay | Admitting: Internal Medicine

## 2021-12-12 ENCOUNTER — Telehealth: Payer: Self-pay

## 2021-12-12 LAB — CBC WITH DIFFERENTIAL/PLATELET
Abs Immature Granulocytes: 0.01 10*3/uL (ref 0.00–0.07)
Basophils Absolute: 0 10*3/uL (ref 0.0–0.1)
Basophils Relative: 1 %
Eosinophils Absolute: 0 10*3/uL (ref 0.0–0.5)
Eosinophils Relative: 1 %
HCT: 35.4 % — ABNORMAL LOW (ref 36.0–46.0)
Hemoglobin: 12.2 g/dL (ref 12.0–15.0)
Immature Granulocytes: 1 %
Lymphocytes Relative: 18 %
Lymphs Abs: 0.4 10*3/uL — ABNORMAL LOW (ref 0.7–4.0)
MCH: 34.6 pg — ABNORMAL HIGH (ref 26.0–34.0)
MCHC: 34.5 g/dL (ref 30.0–36.0)
MCV: 100.3 fL — ABNORMAL HIGH (ref 80.0–100.0)
Monocytes Absolute: 0.4 10*3/uL (ref 0.1–1.0)
Monocytes Relative: 18 %
Neutro Abs: 1.4 10*3/uL — ABNORMAL LOW (ref 1.7–7.7)
Neutrophils Relative %: 61 %
Platelets: 70 10*3/uL — ABNORMAL LOW (ref 150–400)
RBC: 3.53 MIL/uL — ABNORMAL LOW (ref 3.87–5.11)
RDW: 15.3 % (ref 11.5–15.5)
Smear Review: DECREASED
WBC: 2.2 10*3/uL — ABNORMAL LOW (ref 4.0–10.5)
nRBC: 0 % (ref 0.0–0.2)

## 2021-12-12 NOTE — Telephone Encounter (Signed)
Pt partner is requesting a call to see if there is any further recommendations that need to be done prior to appt on 1/6 due to the lack of appts. Pt partner is only requesting to be seen my Dr. Sharlet Salina because of the knowledge of of the history. T  Pt went to ER 12/29 and was DX with FLU type A and  Multifocal Pneumonia.

## 2021-12-12 NOTE — Telephone Encounter (Signed)
See mychart message for response 

## 2021-12-12 NOTE — Discharge Instructions (Signed)
Your workup this evening revealed that you have multifocal pneumonia. You also tested positive for influenza A. As we discussed, your pneumonia may be from either influenza or an underlying bacterial pneumonia. The levaquin you were already prescribed should adequately cover for bacterial pneumonia if it is present. Otherwise, please manage with supportive care and ensure that you are adequately hydrated. Follow-up with your PCP for continued evaluation.  Return if development of any new or worsening symptoms.

## 2021-12-12 NOTE — ED Provider Notes (Signed)
[facility] DEPT Provider Note   CSN: [id number] Arrival date & time: 12/11/21  1938     History Chief Complaint  Patient presents with   Weakness    Faith Orr is a 80 y.o. female.  Patient with history of multiple myeloma actively receiving treatment with chemotherapy and immunotherapy presents today with complaint of cough and weakness. She states that her symptoms began 2 weeks ago. She also endorses general weakness and malaise with cough productive of greenish sputum. She took an at home covid test which was negative. She denies chest pain, increased sob or doe, wheezing, orthopnea, PND, increased LE swelling, palpitations, dizziness or syncope.  She endorses weakness however states that she has also has had reduced appetite which she attributes to side effects from active chemo treatment. She states that she did have a fever which broke yesterday. She did a tele visit with her PCP 2 days ago and was prescribed Levoquin for pneumonia which she has been taking as prescribed.  The history is provided by the patient. No language interpreter was used.  Weakness Associated symptoms: cough   Associated symptoms: no abdominal pain, no chest pain, no diarrhea, no dizziness, no fever, no headaches, no nausea, no seizures, no shortness of breath and no vomiting       Past Medical History:  Diagnosis Date   Allergy    seasonal   Asthma    DEPRESSION    DIABETES MELLITUS, TYPE II    Diverticulosis    HYPERLIPIDEMIA    Macular degeneration of left eye    mild, Dr.Hecker   Obesity, unspecified    Osteoarthritis of both knees    OSTEOPENIA    Osteopenia    URINARY INCONTINENCE     Patient Active Problem List   Diagnosis Date Noted   Cough 12/09/2021   Rash 03/14/2020   Port-A-Cath in place 05/04/2019   Multiple myeloma ([place]) 01/16/2019   Counseling regarding advance care planning and goals of care 01/16/2019   Elevated LFTs 06/07/2018    Arthritis of carpometacarpal (CMC) joint of left thumb 08/02/2017   Degenerative arthritis of knee, bilateral 03/09/2017   Lumbar degenerative disc disease 03/09/2017   Routine general medical examination at a health care facility 10/23/2015   Snoring    Allergic rhinitis 03/28/2013   Reactive airway disease    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   Osteopenia 04/20/2010   Diabetes type 2, controlled ([place]) 04/18/2010   Hyperlipidemia associated with type 2 diabetes mellitus ([place]) 04/18/2010   Morbid obesity ([place]) 04/18/2010    Past Surgical History:  Procedure Laterality Date   CATARACT EXTRACTION Left 05/24/2018   CESAREAN SECTION  01/1973   CYSTOSCOPY/URETEROSCOPY/HOLMIUM LASER/STENT PLACEMENT Right 09/20/2019   Procedure: CYSTOSCOPY/URETEROSCOPY/HOLMIUM LASER/STENT PLACEMENT;  Surgeon: Lucas Mallow, MD;  Location: [facility];  Service: Urology;  Laterality: Right;   FRACTURE SURGERY     IR IMAGING GUIDED PORT INSERTION  02/20/2019   left wrist surgery  2008   By Dr. Latanya Maudlin   right ankle  1994     OB History     Gravida  1   Para  1   Term      Preterm      AB      Living  1      SAB      IAB      Ectopic      Multiple      Live Births  Family History  Problem Relation Age of Onset   Diabetes Father    Hyperlipidemia Father    Heart disease Father    Cancer Father    Hypertension Father    Colon cancer Paternal Grandmother 31   Osteoporosis Mother    Protein S deficiency Mother    Hyperlipidemia Mother    Multiple sclerosis Daughter    Cancer Other        bladder   Breast cancer Neg Hx     Social History   Tobacco Use   Smoking status: Never   Smokeless tobacco: Never   Tobacco comments:    Lives with partner Faith Orr) and son  Vaping Use   Vaping Use: Never used  Substance Use Topics   Alcohol use: No    Alcohol/week: 0.0 standard drinks   Drug use: No    Home Medications Prior to  Admission medications   Medication Sig Start Date End Date Taking? Authorizing Provider  lenalidomide (REVLIMID) 10 MG capsule TAKE 1 CAPSULE BY MOUTH DAILY  FOR 21 DAYS, THEN 7 DAYS OFF 12/03/21   Brunetta Genera, MD  acyclovir (ZOVIRAX) 400 MG tablet TAKE 1 TABLET(400 MG) BY MOUTH TWICE DAILY 11/10/21   Brunetta Genera, MD  Blood Glucose Monitoring Suppl (FREESTYLE FREEDOM LITE) W/DEVICE KIT Use to check blood sugars twice a day Dx 250.00 06/01/14   Rowe Clack, MD  Calcium Carbonate-Vitamin D 600-400 MG-UNIT tablet Take 1 tablet by mouth 2 (two) times daily.    [provider]  Cetirizine HCl 10 MG CAPS Take 1 capsule (10 mg total) by mouth daily. 03/09/19   Tanner, Lyndon Code., PA-C  clindamycin (CLEOCIN) 300 MG capsule Take 2 capsules (600 mg total) by mouth once as needed for up to 1 dose (1 hour prior to dental procedures). 03/05/21   Brunetta Genera, MD  dexamethasone (DECADRON) 4 MG tablet Take 5 tablets (20 mg total) by mouth once a week. On D22 of each cycle of treatment 03/09/19   Brunetta Genera, MD  ELIQUIS 2.5 MG TABS tablet TAKE 1 TABLET(2.5 MG) BY MOUTH TWICE DAILY 10/31/21   Brunetta Genera, MD  fentaNYL (DURAGESIC) 12 MCG/HR Place 1 patch onto the skin every 3 (three) days. 11/04/21   Brunetta Genera, MD  fluticasone (FLONASE) 50 MCG/ACT nasal spray Place 1 spray into both nostrils daily. 12/11/20   Hoyt Koch, MD  glipiZIDE (GLUCOTROL XL) 5 MG 24 hr tablet TAKE 1 TABLET(5 MG) BY MOUTH DAILY WITH BREAKFAST 03/11/21   Hoyt Koch, MD  glucose blood (FREESTYLE LITE) test strip CHECK BLOOD SUGAR TWICE DAILY AS DIRECTED Dx 250.00 07/13/14   Rowe Clack, MD  Lancets (FREESTYLE) lancets Use twice daily to check sugars. 04/15/16   Hoyt Koch, MD  levofloxacin (LEVAQUIN) 500 MG tablet Take 1 tablet (500 mg total) by mouth daily for 10 days. 12/09/21 12/19/21  Biagio Borg, MD  lidocaine-prilocaine (EMLA) cream APPLY  1 APPLICATION TO THE AFFECTED AREA AS NEEDED. USE PRIOR TO PORT ACCESS 09/19/21   Brunetta Genera, MD  metFORMIN (GLUCOPHAGE-XR) 500 MG 24 hr tablet TAKE 3 TABLETS(1500 MG) BY MOUTH DAILY WITH BREAKFAST 10/01/21   Hoyt Koch, MD  Multiple Vitamins-Minerals (ICAPS) CAPS Take 1 capsule by mouth daily after breakfast.    [provider]  ondansetron (ZOFRAN) 8 MG tablet Take 1 tablet (8 mg total) by mouth 2 (two) times daily as needed (Nausea or vomiting). 08/20/20  Brunetta Genera, MD  Oxycodone HCl 10 MG TABS Take 1 tablet (10 mg total) by mouth every 6 (six) hours as needed. 11/20/21   Brunetta Genera, MD  pantoprazole (PROTONIX) 20 MG tablet TAKE 1 TABLET(20 MG) BY MOUTH DAILY 12/11/20   Hoyt Koch, MD  polyethylene glycol (MIRALAX / GLYCOLAX) packet Take 17 g by mouth daily after breakfast.    [provider]  potassium chloride SA (KLOR-CON) 20 MEQ tablet Take 2 tablets (40 mEq total) by mouth 2 (two) times daily. TAKE 2 TABLET(20 MEQ) BY MOUTH TWICE DAILY 07/08/21   Brunetta Genera, MD  prochlorperazine (COMPAZINE) 10 MG tablet Take 1 tablet (10 mg total) by mouth every 6 (six) hours as needed (Nausea or vomiting). 01/16/19   Brunetta Genera, MD  REVLIMID 10 MG capsule TAKE 1 CAPSULE BY MOUTH  DAILY FOR 21 DAYS, THEN 7  DAYS OFF 09/11/21   Brunetta Genera, MD  senna-docusate (SENNA S) 8.6-50 MG tablet Take 2 tablets by mouth at bedtime. 03/26/20   Brunetta Genera, MD  sertraline (ZOLOFT) 50 MG tablet Take 1 tablet (50 mg total) by mouth daily. 05/05/21   Hoyt Koch, MD  simvastatin (ZOCOR) 20 MG tablet TAKE 1 TABLET BY MOUTH EVERY DAY AT 6 PM 07/30/21   Hoyt Koch, MD  Triamcinolone Acetonide 0.025 % LOTN Apply 1 application topically 3 (three) times daily as needed (rash/itching). 03/09/19   Tanner, Lyndon Code., PA-C  Vitamin D, Ergocalciferol, (DRISDOL) 1.25 MG (50000 UNIT) CAPS capsule TAKE 1 CAPSULE BY MOUTH EVERY  7 DAYS 09/17/21   Brunetta Genera, MD    Allergies    Penicillins, Aleve [naproxen sodium], and Sulfonamide derivatives  Review of Systems   Review of Systems  Constitutional:  Negative for chills and fever.  HENT:  Negative for congestion, rhinorrhea, sinus pressure, sinus pain and sore throat.   Respiratory:  Positive for cough. Negative for shortness of breath.   Cardiovascular:  Negative for chest pain.  Gastrointestinal:  Negative for abdominal pain, diarrhea, nausea and vomiting.  Skin:  Negative for rash.  Neurological:  Positive for weakness. Negative for dizziness, tremors, seizures, syncope, facial asymmetry, speech difficulty, light-headedness, numbness and headaches.  Psychiatric/Behavioral:  Negative for confusion and decreased concentration.   All other systems reviewed and are negative.  Physical Exam Updated Vital Signs BP (!) 116/105    Pulse 85    Temp 97.9 F (36.6 C) (Oral)    Resp 18    Ht 4' 11" (1.499 m)    Wt 68.9 kg    LMP 10/09/2012    SpO2 95%    BMI 30.70 kg/m   Physical Exam Vitals and nursing note reviewed.  Constitutional:      General: She is not in acute distress.    Appearance: Normal appearance. She is normal weight. She is not ill-appearing, toxic-appearing or diaphoretic.     Comments: Patient chronically ill appearing sitting comfortably in chair in no acute distress  HENT:     Head: Normocephalic and atraumatic.  Eyes:     Extraocular Movements: Extraocular movements intact.  Cardiovascular:     Rate and Rhythm: Normal rate and regular rhythm.     Heart sounds: Normal heart sounds.  Pulmonary:     Effort: Pulmonary effort is normal. No respiratory distress.     Breath sounds: Normal breath sounds. No stridor. No wheezing, rhonchi or rales.  Abdominal:     General: Abdomen is flat.  Palpations: Abdomen is soft.  Musculoskeletal:        General: Normal range of motion.     Cervical back: Normal range of motion and neck supple.   Skin:    General: Skin is warm and dry.  Neurological:     General: No focal deficit present.     Mental Status: She is alert.  Psychiatric:        Mood and Affect: Mood normal.        Behavior: Behavior normal.    ED Results / Procedures / Treatments   Labs (all labs ordered are listed, but only abnormal results are displayed) Labs Reviewed  RESP PANEL BY RT-PCR (FLU A&B, COVID) ARPGX2 - Abnormal; Notable for the following components:      Result Value   Influenza A by PCR POSITIVE (*)    All other components within normal limits  CBC WITH DIFFERENTIAL/PLATELET - Abnormal; Notable for the following components:   WBC 2.2 (*)    RBC 3.53 (*)    HCT 35.4 (*)    MCV 100.3 (*)    MCH 34.6 (*)    All other components within normal limits  BASIC METABOLIC PANEL - Abnormal; Notable for the following components:   Sodium 134 (*)    Chloride 114 (*)    CO2 10 (*)    Glucose, Bld 146 (*)    Creatinine, Ser 1.06 (*)    GFR, Estimated 54 (*)    All other components within normal limits    EKG None  Radiology DG Chest 2 View  Result Date: 12/11/2021 CLINICAL DATA:  Cough and congestion for 1 week EXAM: CHEST - 2 VIEW COMPARISON:  10/10/2019 FINDINGS: Frontal and lateral views of the chest demonstrates stable right chest wall port. Cardiac silhouette is unremarkable. There is patchy bilateral perihilar airspace disease, greatest in the left infrahilar region. No effusion or pneumothorax. Prior bilateral healed rib fractures. Lucencies within the bilateral humeri consistent with known history of multiple myeloma. IMPRESSION: 1. Patchy bilateral perihilar airspace disease consistent with multifocal bronchopneumonia. 2. Bilateral humeral lucencies consistent with known multiple myeloma. Electronically Signed   By: Randa Ngo M.D.   On: 12/11/2021 21:24    Procedures Procedures   Medications Ordered in ED Medications  ondansetron (ZOFRAN-ODT) disintegrating tablet 4 mg (4 mg Oral  Given 12/11/21 2249)    ED Course  I have reviewed the triage vital signs and the nursing notes.  Pertinent labs & imaging results that were available during my care of the patient were reviewed by me and considered in my medical decision making (see chart for details).    MDM Rules/Calculators/A&P                         Patient presents today with cough and congestion for 2 weeks. She has multiple myeloma actively on chemo treatment. She is afebrile, not tachycardic, satting in the high 90s in no acute distress. CBC with leukopenia at 2.2 which is baseline for her in the setting of cancer on chemo. No anemia. Thrombocytopenia at 70 also per baseline.   Swab positive for influenza A. Some hyponatremia at 134, chloride at 114. Chest x-ray reveals multifocal bronchopneumonia. This could be from influenza or underlying bacterial pneumonia. Patients lungs clear to auscultation in all fields.  Patient ambulatory with stable oxygen saturation although she does endorse some associated weakness.  Thoroughly discussed with patient admission vs discharge, patient refusing admission at this time,  feels that she can manage her symptoms at home with po Levaquin to cover potential bacterial pneumonia. Also offered fluids with some decreased oral intake which she also declined. Feel patient is stable for discharge with close return precautions, she has been educated on red flag symptoms that would prompt immediate return. Discharged in stable condition.   This is a shared visit with supervising physician Dr. Rogene Houston who has independently evaluated patient & provided guidance in evaluation/management/disposition, in agreement with care     Final Clinical Impression(s) / ED Diagnoses Final diagnoses:  Influenza A  Multifocal pneumonia    Rx / DC Orders ED Discharge Orders     None     An After Visit Summary was printed and given to the patient.    Nestor Lewandowsky 12/12/21 0107     Fredia Sorrow, MD 12/25/21 [phone]

## 2021-12-16 ENCOUNTER — Telehealth: Payer: Medicare PPO | Admitting: Internal Medicine

## 2021-12-18 ENCOUNTER — Ambulatory Visit: Payer: Medicare PPO | Admitting: Hematology

## 2021-12-18 ENCOUNTER — Other Ambulatory Visit: Payer: Medicare PPO

## 2021-12-18 ENCOUNTER — Ambulatory Visit: Payer: Medicare PPO

## 2021-12-19 ENCOUNTER — Telehealth (INDEPENDENT_AMBULATORY_CARE_PROVIDER_SITE_OTHER): Payer: Medicare PPO | Admitting: Internal Medicine

## 2021-12-19 ENCOUNTER — Encounter: Payer: Self-pay | Admitting: Internal Medicine

## 2021-12-19 ENCOUNTER — Other Ambulatory Visit: Payer: Self-pay

## 2021-12-19 DIAGNOSIS — F320 Major depressive disorder, single episode, mild: Secondary | ICD-10-CM | POA: Diagnosis not present

## 2021-12-19 DIAGNOSIS — J101 Influenza due to other identified influenza virus with other respiratory manifestations: Secondary | ICD-10-CM | POA: Diagnosis not present

## 2021-12-19 MED ORDER — SERTRALINE HCL 100 MG PO TABS: 100.0000 mg | ORAL_TABLET | Freq: Every day | ORAL | 3 refills | Status: DC

## 2021-12-19 NOTE — Assessment & Plan Note (Signed)
She did end up taking 5 day course levaquin for possible pneumonia before diagnosis. Outside window for tamiflu at diagnosis. She is feeling better but still not well. Advised to delay chemotherapy until feeling better. Increase diet and activity gradually as able. For any worsening or new SOB call or return.

## 2021-12-19 NOTE — Assessment & Plan Note (Signed)
She inadvertently stopped sertraline about 15 days ago and has not noticed any changes. We have agreed to D/C this and if she notices negative change in mood let us know and we can resume.

## 2021-12-19 NOTE — Progress Notes (Signed)
Virtual Visit via Video Note  I connected with Faith Orr on 12/19/21 at 10:40 AM EST by a video enabled telemedicine application and verified that I am speaking with the correct person using two identifiers.  The patient and the provider were at separate locations throughout the entire encounter. Patient location: home, Provider location: work   I discussed the limitations of evaluation and management by telemedicine and the availability of in person appointments. The patient expressed understanding and agreed to proceed. The patient and the provider were the only parties present for the visit unless noted in HPI below.  History of Present Illness: The patient is a 81 y.o. female with visit for flu and multifocal pneumonia ER visit 12/11/21. Started about 16 days ago. Stopped sertraline accidentally around same time and has not noticed different would like to stay off.   Observations/Objective: Appearance: appears mildly ill, breathing appears normal no coughing during visit, casual grooming  Assessment and Plan: See problem oriented charting  Follow Up Instructions: Will D/C sertraline and monitor for worsening mood. Continue to increase diet gradually and activity gradually. For any worsening SOB or cough call or return. 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   I discussed the assessment and treatment plan with the patient. The patient was provided an opportunity to ask questions and all were answered. The patient agreed with the plan and demonstrated an understanding of the instructions.   The patient was advised to call back or seek an in-person evaluation if the symptoms worsen or if the condition fails to improve as anticipated.  Hoyt Koch, MD

## 2021-12-24 ENCOUNTER — Encounter: Payer: Self-pay | Admitting: Hematology

## 2021-12-25 ENCOUNTER — Other Ambulatory Visit (HOSPITAL_COMMUNITY): Payer: Self-pay | Admitting: *Deleted

## 2021-12-25 DIAGNOSIS — Z7189 Other specified counseling: Secondary | ICD-10-CM

## 2021-12-25 DIAGNOSIS — C7951 Secondary malignant neoplasm of bone: Secondary | ICD-10-CM

## 2021-12-25 DIAGNOSIS — C9000 Multiple myeloma not having achieved remission: Secondary | ICD-10-CM

## 2021-12-25 MED ORDER — ONDANSETRON HCL 8 MG PO TABS: 8.0000 mg | ORAL_TABLET | Freq: Two times a day (BID) | ORAL | 1 refills | Status: DC | PRN

## 2021-12-25 MED ORDER — VITAMIN D (ERGOCALCIFEROL) 1.25 MG (50000 UNIT) PO CAPS: 50000.0000 [IU] | ORAL_CAPSULE | ORAL | 0 refills | Status: DC

## 2021-12-29 ENCOUNTER — Other Ambulatory Visit: Payer: Self-pay | Admitting: Hematology

## 2021-12-29 ENCOUNTER — Telehealth: Payer: Self-pay

## 2021-12-29 ENCOUNTER — Encounter: Payer: Self-pay | Admitting: Hematology

## 2021-12-29 DIAGNOSIS — E876 Hypokalemia: Secondary | ICD-10-CM

## 2021-12-29 NOTE — Telephone Encounter (Signed)
Pt partner states that pt passed out 12/27/21 around 1:00am. Partner states that pt was down for about 10-15 seconds.  Partner states she hit her shoulder on the way down denies hitting head. Partner states that pt experienced some dizzy spells 12/28/21 2 different times. Partner is encouraging pt to use walker while walking.   Nurse advised pt to go to the ER and Pt refused at this time. Appt was scheduled for Dr. Sharlet Salina on 1/23. Pt declines seeing another provider.

## 2021-12-29 NOTE — Progress Notes (Signed)
Called patient to obtain needed information for copay enrollment. She provided information.  Attempted to call [facility] for medication coverage verification and they were closed in observance of holiday.  Will call back tomorrow.  Patient has my direct number for any additional financial questions or concerns.  Patient had question regarding oral chemo medication assistance. Advised and sent staff message to oral chemo clinic to follow up with patient.

## 2021-12-30 ENCOUNTER — Other Ambulatory Visit: Payer: Self-pay

## 2021-12-30 DIAGNOSIS — C9000 Multiple myeloma not having achieved remission: Secondary | ICD-10-CM

## 2021-12-30 MED ORDER — FENTANYL 12 MCG/HR TD PT72: 1.0000 | MEDICATED_PATCH | TRANSDERMAL | 0 refills | Status: DC

## 2021-12-31 ENCOUNTER — Encounter: Payer: Self-pay | Admitting: Hematology

## 2021-12-31 NOTE — Progress Notes (Signed)
Applied for copay assistance for 2023 for [place] through the [employer] on 12/30/21.  Received determination letter via fax.  Patient approved for $12,000 12/31/21 - 12/30/22 which will cover her copay after insurance pays their portion. A copy of the approval letter will be sent to patient for her records only.  A copy of approval letter and POE given to [facility] for billing/claim submissions.

## 2022-01-02 ENCOUNTER — Other Ambulatory Visit: Payer: Self-pay | Admitting: Hematology

## 2022-01-02 DIAGNOSIS — C9000 Multiple myeloma not having achieved remission: Secondary | ICD-10-CM

## 2022-01-05 ENCOUNTER — Ambulatory Visit: Payer: Medicare PPO | Admitting: Internal Medicine

## 2022-01-06 ENCOUNTER — Ambulatory Visit: Payer: Medicare PPO | Admitting: Internal Medicine

## 2022-01-06 ENCOUNTER — Encounter: Payer: Self-pay | Admitting: Internal Medicine

## 2022-01-06 ENCOUNTER — Other Ambulatory Visit: Payer: Self-pay

## 2022-01-06 VITALS — BP 118/62 | HR 79 | Resp 18 | Ht 59.0 in | Wt 143.4 lb

## 2022-01-06 DIAGNOSIS — R55 Syncope and collapse: Secondary | ICD-10-CM

## 2022-01-06 DIAGNOSIS — F320 Major depressive disorder, single episode, mild: Secondary | ICD-10-CM

## 2022-01-06 DIAGNOSIS — E118 Type 2 diabetes mellitus with unspecified complications: Secondary | ICD-10-CM

## 2022-01-06 NOTE — Patient Instructions (Addendum)
You can use the flonase or the zyrtec over the counter for the drainage.  Your EKG today is normal and no change from 2016.   [name] has sent Faith Orr a message about the tresiba she has been approved for the patient assistance. The dosing of tresiba is 56 units same as other form of insulin.

## 2022-01-06 NOTE — Progress Notes (Signed)
° °  Subjective:   Patient ID: Faith Orr, female    DOB: 18-May-1941, 81 y.o.   MRN: [id number]  HPI The patient is  a 81 YO female coming in for syncope and dizziness. She was walking and passed out. Did not hit head. Came to within a minute per spouse. No seizure like activity and not confused afterwards. She had the flu and was not eating and drinking well. She had been off medications and restarted those prior to this episode. The dizziness is improving and she has not had this today and is starting to finally feel better.   Review of Systems  Constitutional: Negative.   HENT: Negative.    Eyes: Negative.   Respiratory:  Negative for cough, chest tightness and shortness of breath.   Cardiovascular:  Negative for chest pain, palpitations and leg swelling.  Gastrointestinal:  Negative for abdominal distention, abdominal pain, constipation, diarrhea, nausea and vomiting.  Musculoskeletal: Negative.   Skin: Negative.   Neurological:  Positive for dizziness and syncope.  Psychiatric/Behavioral: Negative.     Objective:  Physical Exam Constitutional:      Appearance: She is well-developed.  HENT:     Head: Normocephalic and atraumatic.  Cardiovascular:     Rate and Rhythm: Normal rate and regular rhythm.  Pulmonary:     Effort: Pulmonary effort is normal. No respiratory distress.     Breath sounds: Normal breath sounds. No wheezing or rales.  Abdominal:     General: Bowel sounds are normal. There is no distension.     Palpations: Abdomen is soft.     Tenderness: There is no abdominal tenderness. There is no rebound.  Musculoskeletal:     Cervical back: Normal range of motion.  Skin:    General: Skin is warm and dry.  Neurological:     Mental Status: She is alert and oriented to person, place, and time.     Coordination: Coordination abnormal.     Comments: walker    Vitals:   01/06/22 1551  BP: 118/62  Pulse: 79  Resp: 18  SpO2: 97%  Weight: 143 lb 6.4 oz (65 kg)   Height: 4\' 11"  (1.499 m)   EKG: Rate 76, axis normal, interval normal, sinus, no st or t wave changes, no significant change compared to prior 2016   This visit occurred during the SARS-CoV-2 public health emergency.  Safety protocols were in place, including screening questions prior to the visit, additional usage of staff PPE, and extensive cleaning of exam room while observing appropriate contact time as indicated for disinfecting solutions.   Assessment & Plan:

## 2022-01-08 ENCOUNTER — Other Ambulatory Visit: Payer: Self-pay

## 2022-01-08 ENCOUNTER — Inpatient Hospital Stay: Payer: Medicare PPO

## 2022-01-08 ENCOUNTER — Inpatient Hospital Stay: Payer: Medicare PPO | Admitting: Hematology

## 2022-01-08 ENCOUNTER — Inpatient Hospital Stay: Payer: Medicare PPO | Attending: Hematology

## 2022-01-08 VITALS — BP 123/65 | HR 81 | Temp 97.5°F | Resp 18 | Wt 146.9 lb

## 2022-01-08 DIAGNOSIS — K119 Disease of salivary gland, unspecified: Secondary | ICD-10-CM | POA: Insufficient documentation

## 2022-01-08 DIAGNOSIS — Z800 Family history of malignant neoplasm of digestive organs: Secondary | ICD-10-CM | POA: Diagnosis not present

## 2022-01-08 DIAGNOSIS — Z8701 Personal history of pneumonia (recurrent): Secondary | ICD-10-CM | POA: Insufficient documentation

## 2022-01-08 DIAGNOSIS — Z79899 Other long term (current) drug therapy: Secondary | ICD-10-CM | POA: Insufficient documentation

## 2022-01-08 DIAGNOSIS — Z7901 Long term (current) use of anticoagulants: Secondary | ICD-10-CM | POA: Insufficient documentation

## 2022-01-08 DIAGNOSIS — Z780 Asymptomatic menopausal state: Secondary | ICD-10-CM | POA: Diagnosis not present

## 2022-01-08 DIAGNOSIS — C9000 Multiple myeloma not having achieved remission: Secondary | ICD-10-CM | POA: Insufficient documentation

## 2022-01-08 DIAGNOSIS — M899 Disorder of bone, unspecified: Secondary | ICD-10-CM

## 2022-01-08 DIAGNOSIS — R918 Other nonspecific abnormal finding of lung field: Secondary | ICD-10-CM | POA: Diagnosis not present

## 2022-01-08 DIAGNOSIS — D72819 Decreased white blood cell count, unspecified: Secondary | ICD-10-CM | POA: Insufficient documentation

## 2022-01-08 DIAGNOSIS — R5383 Other fatigue: Secondary | ICD-10-CM | POA: Diagnosis not present

## 2022-01-08 DIAGNOSIS — R55 Syncope and collapse: Secondary | ICD-10-CM | POA: Insufficient documentation

## 2022-01-08 DIAGNOSIS — Z7189 Other specified counseling: Secondary | ICD-10-CM

## 2022-01-08 DIAGNOSIS — Z95828 Presence of other vascular implants and grafts: Secondary | ICD-10-CM

## 2022-01-08 DIAGNOSIS — Z23 Encounter for immunization: Secondary | ICD-10-CM

## 2022-01-08 LAB — CMP (CANCER CENTER ONLY)
ALT: 12 U/L (ref 0–44)
AST: 15 U/L (ref 15–41)
Albumin: 3.5 g/dL (ref 3.5–5.0)
Alkaline Phosphatase: 87 U/L (ref 38–126)
Anion gap: 9 (ref 5–15)
BUN: 11 mg/dL (ref 8–23)
CO2: 20 mmol/L — ABNORMAL LOW (ref 22–32)
Calcium: 8.8 mg/dL — ABNORMAL LOW (ref 8.9–10.3)
Chloride: 106 mmol/L (ref 98–111)
Creatinine: 0.9 mg/dL (ref 0.44–1.00)
GFR, Estimated: >60 mL/min (ref >60)
Glucose, Bld: 220 mg/dL — ABNORMAL HIGH (ref 70–99)
Potassium: 3.9 mmol/L (ref 3.5–5.1)
Sodium: 135 mmol/L (ref 135–145)
Total Bilirubin: 0.7 mg/dL (ref 0.3–1.2)
Total Protein: 6 g/dL — ABNORMAL LOW (ref 6.5–8.1)

## 2022-01-08 LAB — CBC WITH DIFFERENTIAL (CANCER CENTER ONLY)
Abs Immature Granulocytes: 0.02 10*3/uL (ref 0.00–0.07)
Basophils Absolute: 0 10*3/uL (ref 0.0–0.1)
Basophils Relative: 1 %
Eosinophils Absolute: 0.2 10*3/uL (ref 0.0–0.5)
Eosinophils Relative: 8 %
HCT: 31.1 % — ABNORMAL LOW (ref 36.0–46.0)
Hemoglobin: 10.3 g/dL — ABNORMAL LOW (ref 12.0–15.0)
Immature Granulocytes: 1 %
Lymphocytes Relative: 23 %
Lymphs Abs: 0.5 10*3/uL — ABNORMAL LOW (ref 0.7–4.0)
MCH: 35.3 pg — ABNORMAL HIGH (ref 26.0–34.0)
MCHC: 33.1 g/dL (ref 30.0–36.0)
MCV: 106.5 fL — ABNORMAL HIGH (ref 80.0–100.0)
Monocytes Absolute: 0.5 10*3/uL (ref 0.1–1.0)
Monocytes Relative: 23 %
Neutro Abs: 1 10*3/uL — ABNORMAL LOW (ref 1.7–7.7)
Neutrophils Relative %: 44 %
Platelet Count: 69 10*3/uL — ABNORMAL LOW (ref 150–400)
RBC: 2.92 MIL/uL — ABNORMAL LOW (ref 3.87–5.11)
RDW: 15.6 % — ABNORMAL HIGH (ref 11.5–15.5)
WBC Count: 2.3 10*3/uL — ABNORMAL LOW (ref 4.0–10.5)
nRBC: 0 % (ref 0.0–0.2)

## 2022-01-08 MED ORDER — OXYCODONE HCL 10 MG PO TABS: 10.0000 mg | ORAL_TABLET | Freq: Four times a day (QID) | ORAL | 0 refills | Status: DC | PRN

## 2022-01-08 MED ORDER — SODIUM CHLORIDE 0.9% FLUSH
10.0000 mL | Freq: Once | INTRAVENOUS | Status: AC
  Administered 2022-01-08: 10 mL

## 2022-01-08 MED ORDER — LENALIDOMIDE 5 MG PO CAPS: 5.0000 mg | ORAL_CAPSULE | Freq: Every day | ORAL | 0 refills | Status: DC

## 2022-01-08 NOTE — Assessment & Plan Note (Signed)
We had increased her zoloft to 100 mg daily prior to dizziness and we did discuss that this can cause dizziness. This is normalizing now a few weeks into the medication. She is getting CMP later this week to screen for hyponatremia (can be side effect of zoloft).

## 2022-01-08 NOTE — Assessment & Plan Note (Signed)
Had been off medications due to illness and not eating and drinking well. She is back on medications and not having any low sugars.

## 2022-01-08 NOTE — Assessment & Plan Note (Signed)
EKG done which is not changed from prior. Labs getting with oncology later this week we will review. Likely vasovagal from poor intake, resumption of medications and influenza illness. She is improving and eating and drinking more consistently.

## 2022-01-12 ENCOUNTER — Encounter: Payer: Self-pay | Admitting: Hematology

## 2022-01-13 ENCOUNTER — Other Ambulatory Visit: Payer: Self-pay | Admitting: Hematology

## 2022-01-13 MED ORDER — MECLIZINE HCL 25 MG PO TABS: 25.0000 mg | ORAL_TABLET | Freq: Three times a day (TID) | ORAL | 0 refills | Status: DC | PRN

## 2022-01-14 ENCOUNTER — Encounter: Payer: Self-pay | Admitting: Hematology

## 2022-01-14 NOTE — Progress Notes (Incomplete Revision)
HEMATOLOGY/ONCOLOGY CLINIC NOTE  Date of Service: .01/08/2022  Patient Care Team: Hoyt Koch, MD as PCP - General (Internal Medicine) Lafayette Dragon, MD (Inactive) as Consulting Physician (Gastroenterology) Megan Salon, MD as Consulting Physician (Gynecology) Melrose Nakayama, MD as Consulting Physician (Orthopedic Surgery) Deneise Lever, MD as Consulting Physician (Pulmonary Disease) [name], MD (Ophthalmology)  CHIEF COMPLAINTS/PURPOSE OF CONSULTATION:  F/u for continued evaluation and mx of myeloma  HISTORY OF PRESENTING ILLNESS:   Faith Orr is a wonderful 81 y.o. female who has been referred to [name] by Dr. Pricilla Holm for evaluation and management of Lytic bone lesions. She is accompanied today by her son in law, and her partner is present via phone. The pt reports that she is doing well overall.   The pt notes that 2-3 months ago while standing at the stove, and otherwise feeling normally, she felt "something snap that took her breath away" in her mid back as she stretched to get something. The pt notes that she saw a chiropractor twice due to her back stiffness, which did not help. The pt then described her new bone pains to her PCP on 12/05/18, and subsequent imaging, as noted below, revealed concerns for numerous bone lesions. She has begun 39m Fosamax. The pt notes that she had hip pain in 2018, and that an XR at that time did not reveal any lesions.  The pt notes that most of her pain is concentrated to her left shoulder presently, and with minimal movement of the arm. She endorses pain radiating into her left arm and notes that her hand is swollen in the mornings when she wakes up. She also endorses present back pain and right hip pain, worse when she walks. She has not yet seen orthopedics. The pt reports that she is needing to take 6062mAdvil every 4-6 hours as Tramadol alone has not been able to alleviate her pain. The pt denies any  unexpected weight loss, fevers, chills, or night sweats. The pt notes that her urine is a very dark color presently, but denies overt blood in the urine, underpants, nor tissue paper. The pt notes that in the last two weeks she has had some soreness in her head, but denies new headaches or changes in vision. The pt notes that she has been urinating more frequently overall, but has been trying to stay better hydrated as well.  The pt notes that she has been compliant with annual mammograms.   The pt notes that she had a cyst in her right breast which was removed in the past. She fractured her left wrist in 2008 after falling down stairs. The pt endorses history of fatty liver.   The pt denies ever smoking cigarettes and endorses significant second hand smoke exposure with a previous marriage.   Of note prior to the patient's visit today, pt has had a Bone Scan completed on 12/13/18 with results revealing Multifocal uptake throughout the skeleton, consistent with diffuse metastatic disease. Primary tumor is not specified. 2. Uptake in the proximal right femur, consistent with lytic lesions. 3. Uptake in the ribs bilaterally as described. 4. Lesions in the proximal left humerus. 5. Diffuse uptake throughout the skull consistent with metastatic disease. 6. Right paramedian uptake at the manubrium.  Most recent lab results (12/08/18) of CBC w/diff and CMP is as follows: all values are WNL except for Glucose at 279, BUN at 24, AST at 41, ALT at 46. 12/08/18 SPEP revealed all values  WNL except for Total Protein at 6.0, Albumin at 3.6, Gamma globulin at 0.7, and M spike at 0.5g  On review of systems, pt reports significant left shoulder pain, back pain, right hip pain, dark urine, and denies fevers, chills, night sweats, unexpected weight loss, changes in bowel habits, changes in breathing, cough, new respiratory symptoms, changes in vision, abdominal pains, leg swelling, and any other symptoms.   On PMHx the  pt reports fatty liver, and denies blood clots.  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 Denies ever smoking.  On Family Hx the pt reports maternal grandmother with colon cancer. Father with bladder cancer and amyloidosis (pt notes that this could have been misdiagnosis). Mother with Protein S deficiency and polymyalgia rheumatica.  Current Treatment: Carfilzomib + Revlimid maintenance.  INTERVAL HISTORY:   Faith Orr returns today for management and evaluation of her Multiple Myeloma. The patient's last visit with [name] was on 08/28/2021.  The pt reports that she is doing well overall.  The pt reports no acute new symptoms. No infection issues.  No abnormal bleeding or bruising. No new treatment toxicities. No new bone pains Grade 1 fatigue which is stable. Lab results today 10/23/2021 CBC with hemoglobin of 10.9, WBC count of 2.7k with ANC of 1100, platelets of 65k CMP stable creatinine 1.1  Her bone marrow biopsy done on 10/20/2021 showed less than 1% plasma cells.  She was noted to have some dyspoietic changes in the erythroid precursors which were thought to be related to her treatment.  And recent PET CT scan on 10/17/2021 showed  1. Intense radiotracer activity associated with two LEFT neck lymph nodes similar to comparison exam. 2. Interval decrease in activity in LEFT humerus lesion. 3. 4. Stable decreased activity in several RIGHT rib lesions. Potential posttraumatic. 5. No new lesions identified by FDG PET imaging within the skeleton. 6. No plasmacytoma.  Patient is glad that she has no overt evidence of myeloma progression on her bone marrow biopsy or PET CT scan.  On review of systems, pt reports no new bone pains and denies fevers/chills/nightsweats and any other symptoms.   MEDICAL HISTORY:  Past Medical History:  Diagnosis Date   Allergy    seasonal   Asthma    DEPRESSION    DIABETES MELLITUS, TYPE II     Diverticulosis    HYPERLIPIDEMIA    Macular degeneration of left eye    mild, Dr.Hecker   Obesity, unspecified    Osteoarthritis of both knees    OSTEOPENIA    Osteopenia    URINARY INCONTINENCE     SURGICAL HISTORY: Past Surgical History:  Procedure Laterality Date   CATARACT EXTRACTION Left 05/24/2018   CESAREAN SECTION  01/1973   CYSTOSCOPY/URETEROSCOPY/HOLMIUM LASER/STENT PLACEMENT Right 09/20/2019   Procedure: CYSTOSCOPY/URETEROSCOPY/HOLMIUM LASER/STENT PLACEMENT;  Surgeon: [name], MD;  Location: [facility];  Service: Urology;  Laterality: Right;   FRACTURE SURGERY     IR IMAGING GUIDED PORT INSERTION  02/20/2019   left wrist surgery  2008   By Dr. Yisroel Ramming   right ankle  1994    SOCIAL HISTORY: Social History   Socioeconomic History   Marital status: Married    Spouse name: Not on file   Number of children: 1   Years of education: Not on file   Highest education level: Not on file  Occupational History    Employer: [employer]  Tobacco Use  Smoking status: Never   Smokeless tobacco: Never   Tobacco comments:    Lives with partner [name]) and son  Vaping Use   Vaping Use: Never used  Substance and Sexual Activity   Alcohol use: No    Alcohol/week: 0.0 standard drinks   Drug use: No   Sexual activity: Never    Partners: Female    Birth control/protection: Post-menopausal    Comment: Lives with female partner (annette hicks) and 14 yo son  Other Topics Concern   Not on file  Social History Narrative   Not on file   Social Determinants of Health   Financial Resource Strain: Not on file  Food Insecurity: Not on file  Transportation Needs: Not on file  Physical Activity: Not on file  Stress: Not on file  Social Connections: Not on file  Intimate Partner Violence: Not At Risk   Fear of Current or Ex-Partner: No   Emotionally Abused: No   Physically Abused: No   Sexually Abused: No    FAMILY  HISTORY: Family History  Problem Relation Age of Onset   Diabetes Father    Hyperlipidemia Father    Heart disease Father    Cancer Father    Hypertension Father    Colon cancer Paternal Grandmother 81   Osteoporosis Mother    Protein S deficiency Mother    Hyperlipidemia Mother    Multiple sclerosis Daughter    Cancer Other        bladder   Breast cancer Neg Hx     ALLERGIES:  is allergic to penicillins, aleve [naproxen sodium], and sulfonamide derivatives.  MEDICATIONS:  Current Outpatient Medications  Medication Sig Dispense Refill   acyclovir (ZOVIRAX) 400 MG tablet TAKE 1 TABLET(400 MG) BY MOUTH TWICE DAILY 60 tablet 5   Blood Glucose Monitoring Suppl (FREESTYLE FREEDOM LITE) W/DEVICE KIT Use to check blood sugars twice a day Dx 250.00 1 each 0   Calcium Carbonate-Vitamin D 600-400 MG-UNIT tablet Take 1 tablet by mouth 2 (two) times daily.     Cetirizine HCl 10 MG CAPS Take 1 capsule (10 mg total) by mouth daily. 30 capsule 1   dexamethasone (DECADRON) 4 MG tablet Take 5 tablets (20 mg total) by mouth once a week. On D22 of each cycle of treatment 20 tablet 5   ELIQUIS 2.5 MG TABS tablet TAKE 1 TABLET(2.5 MG) BY MOUTH TWICE DAILY 60 tablet 2   fentaNYL (DURAGESIC) 12 MCG/HR Place 1 patch onto the skin every 3 (three) days. 10 patch 0   glucose blood (FREESTYLE LITE) test strip CHECK BLOOD SUGAR TWICE DAILY AS DIRECTED Dx 250.00 180 each 3   Lancets (FREESTYLE) lancets Use twice daily to check sugars. 100 each 11   lidocaine-prilocaine (EMLA) cream APPLY 1 APPLICATION TO THE AFFECTED AREA AS NEEDED. USE PRIOR TO PORT ACCESS 30 g 0   metFORMIN (GLUCOPHAGE-XR) 500 MG 24 hr tablet TAKE 3 TABLETS(1500 MG) BY MOUTH DAILY WITH BREAKFAST (Patient taking differently: Take 1 tablet in AM and 1 Tablet in PM) 90 tablet 1   Multiple Vitamins-Minerals (ICAPS) CAPS Take 1 capsule by mouth daily after breakfast.     ondansetron (ZOFRAN) 8 MG tablet Take 1 tablet (8 mg total) by mouth 2  (two) times daily as needed (Nausea or vomiting). 30 tablet 1   pantoprazole (PROTONIX) 20 MG tablet TAKE 1 TABLET(20 MG) BY MOUTH DAILY 30 tablet 5   polyethylene glycol (MIRALAX / GLYCOLAX) packet Take 17 g by mouth daily after  breakfast.     potassium chloride SA (KLOR-CON M) 20 MEQ tablet TAKE 2 TABLETS BY MOUTH TWICE DAILY 360 tablet 1   prochlorperazine (COMPAZINE) 10 MG tablet Take 1 tablet (10 mg total) by mouth every 6 (six) hours as needed (Nausea or vomiting). 30 tablet 1   senna-docusate (SENNA S) 8.6-50 MG tablet Take 2 tablets by mouth at bedtime. 60 tablet 2   sertraline (ZOLOFT) 100 MG tablet Take 1 tablet (100 mg total) by mouth daily. 30 tablet 3   simvastatin (ZOCOR) 20 MG tablet TAKE 1 TABLET BY MOUTH EVERY DAY AT 6 PM 90 tablet 3   Vitamin D, Ergocalciferol, (DRISDOL) 1.25 MG (50000 UNIT) CAPS capsule Take 1 capsule (50,000 Units total) by mouth every 7 (seven) days. 12 capsule 0   clindamycin (CLEOCIN) 300 MG capsule Take 2 capsules (600 mg total) by mouth once as needed for up to 1 dose (1 hour prior to dental procedures). (Patient not taking: Reported on 01/08/2022) 2 capsule 0   fluticasone (FLONASE) 50 MCG/ACT nasal spray Place 1 spray into both nostrils daily. (Patient not taking: Reported on 01/08/2022) 16 g 2   lenalidomide (REVLIMID) 5 MG capsule Take 1 capsule (5 mg total) by mouth daily. Take 1 capsule by mouth for 21 days , then take 7 days off. 21 capsule 0   meclizine (ANTIVERT) 25 MG tablet Take 1 tablet (25 mg total) by mouth 3 (three) times daily as needed for dizziness (vertigo). 30 tablet 0   Oxycodone HCl 10 MG TABS Take 1 tablet (10 mg total) by mouth every 6 (six) hours as needed. 90 tablet 0   Triamcinolone Acetonide 0.025 % LOTN Apply 1 application topically 3 (three) times daily as needed (rash/itching). (Patient not taking: Reported on 01/08/2022) 60 mL 2   No current facility-administered medications for this visit.   Facility-Administered Medications  Ordered in Other Visits  Medication Dose Route Frequency Provider Last Rate Last Admin   heparin lock flush 100 unit/mL  500 Units Intracatheter Once PRN Brunetta Genera, MD       sodium chloride flush (NS) 0.9 % injection 10 mL  10 mL Intracatheter PRN Brunetta Genera, MD   10 mL at 10/02/19 1524    REVIEW OF SYSTEMS:   .10 Point review of Systems was done is negative except as noted above.  PHYSICAL EXAMINATION: ECOG FS:2 - Symptomatic, <50% confined to bed  Vitals:   01/08/22 1245  BP: 123/65  Pulse: 81  Resp: 18  Temp: (!) 97.5 F (36.4 C)  SpO2: 98%    Wt Readings from Last 3 Encounters:  01/20/22 148 lb 12 oz (67.5 kg)  01/08/22 146 lb 14.4 oz (66.6 kg)  01/06/22 143 lb 6.4 oz (65 kg)   Body mass index is 29.67 kg/m.   Marland Kitchen GENERAL:alert, in no acute distress and comfortable SKIN: no acute rashes, no significant lesions EYES: conjunctiva are pink and non-injected, sclera anicteric OROPHARYNX: MMM, no exudates, no oropharyngeal erythema or ulceration NECK: supple, no JVD LYMPH:  no palpable lymphadenopathy in the cervical, axillary or inguinal regions LUNGS: clear to auscultation b/l with normal respiratory effort HEART: regular rate & rhythm ABDOMEN:  normoactive bowel sounds , non tender, not distended. Extremity: no pedal edema PSYCH: alert & oriented x 3 with fluent speech NEURO: no focal motor/sensory deficits    LABORATORY DATA:  I have reviewed the data as listed  . CBC Latest Ref Rng & Units 01/20/2022 01/08/2022 12/11/2021  WBC 4.0 -  10.5 K/uL 2.4(L) 2.3(L) 2.2(L)  Hemoglobin 12.0 - 15.0 g/dL 10.5(L) 10.3(L) 12.2  Hematocrit 36.0 - 46.0 % 32.0(L) 31.1(L) 35.4(L)  Platelets 150 - 400 K/uL 131(L) 69(L) 70(L)   . CBC    Component Value Date/Time   WBC 2.4 (L) 01/20/2022 1252   WBC 2.2 (L) 12/11/2021 2254   RBC 2.97 (L) 01/20/2022 1252   HGB 10.5 (L) 01/20/2022 1252   HCT 32.0 (L) 01/20/2022 1252   PLT 131 (L) 01/20/2022 1252   MCV 107.7  (H) 01/20/2022 1252   MCH 35.4 (H) 01/20/2022 1252   MCHC 32.8 01/20/2022 1252   RDW 15.3 01/20/2022 1252   LYMPHSABS 0.6 (L) 01/20/2022 1252   MONOABS 0.4 01/20/2022 1252   EOSABS 0.1 01/20/2022 1252   BASOSABS 0.0 01/20/2022 1252    . CMP Latest Ref Rng & Units 01/20/2022 01/08/2022 12/11/2021  Glucose 70 - 99 mg/dL 208(H) 220(H) 146(H)  BUN 8 - 23 mg/dL _0 Creatinine 0.44 - 1.00 mg/dL 1.04(H) 0.90 1.06(H)  Sodium 135 - 145 mmol/L 139 135 134(L)  Potassium 3.5 - 5.1 mmol/L 4.2 3.9 3.5  Chloride 98 - 111 mmol/L 110 106 114(H)  CO2 22 - 32 mmol/L 21(L) 20(L) 10(L)  Calcium 8.9 - 10.3 mg/dL 9.2 8.8(L) 9.3  Total Protein 6.5 - 8.1 g/dL 6.4(L) 6.0(L) -  Total Bilirubin 0.3 - 1.2 mg/dL 0.5 0.7 -  Alkaline Phos 38 - 126 U/L 95 87 -  AST 15 - 41 U/L 26 15 -  ALT 0 - 44 U/L 27 12 -   09/18/2019 BM Bx Report ([id number])   09/18/2019 FISH Panel    05/30/2019 BM Bx   01/06/2019 BM Bx:     01/06/19 Cytogenetics:      05/30/19 BM Biopsy:   09/18/2019 FISH Panel    09/18/2019 BM Surgical Pathology [phone])     RADIOGRAPHIC STUDIES: I have personally reviewed the radiological images as listed and agreed with the findings in the report. No results found.   ASSESSMENT & PLAN:   81 y.o. female with  1. Recently diagnosed Multiple Myeloma, RISS Stage III  Labs upon initial presentation from 12/08/18, blood counts are normal including WBC at 7.1k, HGB at 13.1, and PLT at 245k. Calcium normal at 10.3. Creatinine normal at 0.63. M spike at 0.5g. 12/13/18 Bone Scan revealed Multifocal uptake throughout the skeleton, consistent with diffuse metastatic disease. Primary tumor is not specified. 2. Uptake in the proximal right femur, consistent with lytic lesions. 3. Uptake in the ribs bilaterally as described. 4. Lesions in the proximal left humerus. 5. Diffuse uptake throughout the skull consistent with metastatic disease. 6. Right paramedian uptake at the  manubrium.  12/13/18 CT Right Femur revealed Numerous lytic lesions involving the right femur and a lytic lesion in the left inferior pubic ramus. Overall appearance is most concerning for multiple myeloma  12/27/18 Pretreatment 24hour UPEP observed an M spike at 73m, and showed 1986mtotal protein/day.  12/27/18 Pretreatment MMP revealed M Protein at 0.5g with IgG Lambda specificity. Kappa:Lambda light chain ratio at 0.13, with Lambda at 40.3. There is less abnormal protein and light chains than I would expect from 30% plasma cells, which suggests hypo-secretory or non-secretory neoplastic plasma cells. Will have an impact in assessing response. 01/05/19 PET/CT revealed Innumerable lytic lesions in the skeleton compatible with myeloma. Most of the larger lesions are hypermetabolic, for example including a left proximal humeral shaft lesion with maximum SUV of 8.1 and a 2.8 cm  lesion in the left T9 vertebral body with maximum SUV 5.1. Most of the smaller lytic lesions, and some of the larger lesions, do not demonstrate accentuated metabolic activity. 2. 1.2 cm in short axis lymph node in the left parapharyngeal space is hypermetabolic with maximum SUV 11.8. I do not see a separate mass in the head and neck to give rise to this hypermetabolic lymph node. 3. Mosaic attenuation in the lower lobes, nonspecific possibly from air trapping. 4.  Aortic Atherosclerosis 5. Heterogeneous activity in the liver, making it hard to exclude small liver lesions. Consider hepatic protocol MRI with and without contrast for definitive assessment. Nonobstructive right nephrolithiasis. Old granulomatous disease  01/06/19 Bone Marrow biopsy revealed interstitial increase in plasma cells (28% aspirate, 40% CD138 immunohistochemistry). Plasma cells negative for light chains consistent with a non or weakly secretory myeloma   01/06/19 Cytogenetics revealed 37% of cells with trisomy 11 or 11q deletion, and 40.5% of cells with 17p  mutation  S/p 5 cycles of KRD treatment  05/31/19 BM Biopsy revealed mild atypical plasmacytosis at 5% with polytypic variation.   06/01/19 PET/CT revealed "Dominant lesion in the LEFT humerus is decreased significantly in metabolic activity. Additional hypermetabolic skeletal lytic lesions have decreased in metabolic activity or similar to comparison exam (01/05/2019). No evidence of disease progression. 2. Multiple additional lytic lesions do not have metabolic activity and unchanged. 3. No new skeletal lesions are identified. No soft tissue plasmacytoma identified. 4. Nodule / node in the LEFT parapharyngeal space which is intensely hypermetabolic not changed from prior. 5. New hypermetabolic LEFT lower lobe pulmonary nodule is indeterminate. Recommend close attention on follow-up 6. New obstructive hydronephrosis of the RIGHT kidney related to RIGHT UPJ stone."  09/18/2019 BM Bx Report which revealed "Slightly hypercellular bone marrow for age with trilineage hematopoiesis and 1% plasma cells."  09/14/2019 PET/CT Whole Body Scan ([id number]) which revealed "1. There widespread tiny lytic lesions compatible with multiple myeloma. Index larger lesions are generally similar to the prior exam, with low-grade activity such as the left T9 vertebral body lesion with maximum SUV 4.5. Is mild increase in the activity associated with a mildly sclerotic left proximal humeral lesion, maximum SUV 4.8 (previously 3.5). 2. At the site of the prior left lower lobe nodule is currently more bandlike thickening, with maximum SUV only 1.9, probably benign, continued surveillance of this region suggested. 3. There several small but hypermetabolic lymph nodes. This includes a left parapharyngeal space node measuring 1.0 cm with maximum SUV 12.3 (stable); a left level IB lymph node measuring 0.5 cm with maximum SUV 4.8 (slightly larger than prior); and a left inguinal lymph node measuring 0.7 cm in short axis with maximum SUV  6.4 (previously 0.5 cm with maximum SUV 0.6). Significance of these lymph nodes uncertain, surveillance is recommended. 4. New 5 mm left lower lobe subpleural nodule on image 32/8, not appreciably hypermetabolic, surveillance suggested. 5. Focal subcutaneous stranding along the left perineum measuring about 2.6 by 1.1 cm on image 221/4, maximum SUV 12.5. This was not present previously and is most likely inflammatory, although given the notable SUV, surveillance of this region is suggested. 6. Other imaging findings of potential clinical significance: Aortic Atherosclerosis (ICD10-I70.0). Coronary atherosclerosis. Old granulomatous disease. Mild right hydronephrosis due to a 7 mm right UPJ calculus. 2 mm right kidney upper pole nonobstructive renal calculus. Prominent stool throughout the colon favors constipation."  12/19/2019 Thoracic & Lumbar Spine MRI ([id number]) ([id number]) revealed "Suspected myeloma lesions at T9 and S1. No compression  deformity or epidural disease.  01/18/2020 PET/CT ([id number]) which revealed "1. Stable lytic lesions throughout the skeleton. The larger lytic lesions which had mild metabolic activity on comparison exam now have background metabolic activity. No evidence of active myeloma. No evidence of progression multiple myeloma.  No plasmacytoma 3. Hypermetabolic nodules in the LEFT neck may be associated deep tissues of the LEFT parotid gland. 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  01/17/19 MRI Liver revealed Several appreciable liver lesions all have benign imaging characteristics. No MRI findings of metastatic involvement of the liver. 2. Scattered bony lesions corresponding to the lytic lesions seen at PET-CT, compatible with active myeloma. 3. Aortic Atherosclerosis.  Mild  cardiomegaly. 4. Diffuse hepatic steatosis.   3. Left lower lobe pulmonary nodule First seen on 06/01/19 PET/CT PET/CT 3/41/9622: No hypermetabolic mediastinal or hilar nodes. No suspicious pulmonary nodules on the CT scan.  4. 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 Being managed conservatively as per patient's preference.  No symptoms from this currently.  PLAN: -Discussed pts Lab results today 10/23/2021 CBC with hemoglobin of 10.9, WBC count of 2.7k with ANC of 1100, platelets of 65k CMP stable creatinine 1.1  Her bone marrow biopsy done on 10/20/2021 showed less than 1% plasma cells.  She was noted to have some dyspoietic changes in the erythroid precursors which were thought to be related to her treatment.  And recent PET CT scan on 10/17/2021 showed  1. Intense radiotracer activity associated with two LEFT neck lymph nodes similar to comparison exam. 2. Interval decrease in activity in LEFT humerus lesion. 3. 4. Stable decreased activity in several RIGHT rib lesions. Potential posttraumatic. 5. No new lesions identified by FDG PET imaging within the skeleton. 6. No plasmacytoma.  Mol;Cy-no evidence of residual TP53 mutation. Patient is glad that she has no overt evidence of myeloma progression on her bone marrow biopsy or PET CT scan. -Will continue Carflizomib at 56 mg/m^2 q2weeks for maintenance. -if progressive thrombocytopenia or leukopenia will need to lower dose of Revlimid further. -Continue 2.5 Eliquis BID for VTE prophylaxis -Continue Potassium daily.  -Continue Zometa q4weeks -Will see back in 2 months with labs.   FOLLOW UP: Please schedule next 3 cycles [6 doses] of carfilzomib with port flush and labs. Zometa q4weeks x 6 MD visit in 6 weeks Evus[place] today   All of the patient's questions were answered with apparent satisfaction. The patient knows to call the clinic  with any problems, questions or concerns.  . The total time spent in the appointment was 32 minutes and more than 50% was on counseling and direct patient cares.    Sullivan Lone MD MS AAHIVMS [facility] Hematology/Oncology Physician [facility].Marland Kitchen

## 2022-01-14 NOTE — Progress Notes (Addendum)
HEMATOLOGY/ONCOLOGY CLINIC NOTE  Date of Service: .01/08/2022  Patient Care Team: Hoyt Koch, MD as PCP - General (Internal Medicine) Lafayette Dragon, MD (Inactive) as Consulting Physician (Gastroenterology) Megan Salon, MD as Consulting Physician (Gynecology) Melrose Nakayama, MD as Consulting Physician (Orthopedic Surgery) Deneise Lever, MD as Consulting Physician (Pulmonary Disease) [name], MD (Ophthalmology)  CHIEF COMPLAINTS/PURPOSE OF CONSULTATION:  Follow-up for continued evaluation and management of multiple myeloma  HISTORY OF PRESENTING ILLNESS:  Please see previous notes for details on initial presentation  Current Treatment: Carfilzomib + Revlimid maintenance.  INTERVAL HISTORY:   Faith Orr is here for continued evaluation and management of her multiple myeloma. Her last clinic visit with [name] was on 10/23/2021.  She unfortunately had to go to the emergency room 12/11/2021 for influenza A infection and multilobar pneumonia.  She had 2 weeks of cough and congestion prior to this.  She was evaluated in the emergency room and sent home on Levaquin.  Patient notes she has completed her antibiotics and her respiratory symptoms have significantly improved and nearly resolved but she is still feeling quite fatigued. Notes decreased p.o. intake. Currently no fevers or other focal symptoms of infection.  Labs today showed CBC with a hemoglobin of 10.3, leukopenia with a WBC count of 2.3k with ANC of 1000 and platelets of 69k CMP showed normal creatinine of 0.9 blood sugar of 220k.  We discussed that she needs more time off treatment to recover from her recent significant respiratory infection and to allow her cytopenias to improve.  She was recommended to hold her Revlimid at this time.  MEDICAL HISTORY:  Past Medical History:  Diagnosis Date   Allergy    seasonal   Asthma    DEPRESSION    DIABETES MELLITUS, TYPE II     Diverticulosis    HYPERLIPIDEMIA    Macular degeneration of left eye    mild, Dr.Hecker   Obesity, unspecified    Osteoarthritis of both knees    OSTEOPENIA    Osteopenia    URINARY INCONTINENCE     SURGICAL HISTORY: Past Surgical History:  Procedure Laterality Date   CATARACT EXTRACTION Left 05/24/2018   CESAREAN SECTION  01/1973   CYSTOSCOPY/URETEROSCOPY/HOLMIUM LASER/STENT PLACEMENT Right 09/20/2019   Procedure: CYSTOSCOPY/URETEROSCOPY/HOLMIUM LASER/STENT PLACEMENT;  Surgeon: Lucas Mallow, MD;  Location: [facility];  Service: Urology;  Laterality: Right;   FRACTURE SURGERY     IR IMAGING GUIDED PORT INSERTION  02/20/2019   left wrist surgery  2008   By Dr. Latanya Maudlin   right ankle  1994    SOCIAL HISTORY: Social History   Socioeconomic History   Marital status: Married    Spouse name: Not on file   Number of children: 1   Years of education: Not on file   Highest education level: Not on file  Occupational History    Employer: [employer]  Tobacco Use   Smoking status: Never   Smokeless tobacco: Never   Tobacco comments:    Lives with partner [name]) and son  Vaping Use   Vaping Use: Never used  Substance and Sexual Activity   Alcohol use: No    Alcohol/week: 0.0 standard drinks   Drug use: No   Sexual activity: Never    Partners: Female    Birth control/protection: Post-menopausal    Comment: Lives with female partner (annette hicks) and 4 yo son  Other Topics Concern   Not on file  Social History Narrative   Not on file   Social Determinants of Health   Financial Resource Strain: Not on file  Food Insecurity: Not on file  Transportation Needs: Not on file  Physical Activity: Not on file  Stress: Not on file  Social Connections: Not on file  Intimate Partner Violence: Not At Risk   Fear of Current or Ex-Partner: No   Emotionally Abused: No   Physically Abused: No   Sexually Abused: No    FAMILY  HISTORY: Family History  Problem Relation Age of Onset   Diabetes Father    Hyperlipidemia Father    Heart disease Father    Cancer Father    Hypertension Father    Colon cancer Paternal Grandmother 10   Osteoporosis Mother    Protein S deficiency Mother    Hyperlipidemia Mother    Multiple sclerosis Daughter    Cancer Other        bladder   Breast cancer Neg Hx     ALLERGIES:  is allergic to penicillins, aleve [naproxen sodium], and sulfonamide derivatives.  MEDICATIONS:  Current Outpatient Medications  Medication Sig Dispense Refill   acyclovir (ZOVIRAX) 400 MG tablet TAKE 1 TABLET(400 MG) BY MOUTH TWICE DAILY 60 tablet 5   Blood Glucose Monitoring Suppl (FREESTYLE FREEDOM LITE) W/DEVICE KIT Use to check blood sugars twice a day Dx 250.00 1 each 0   Calcium Carbonate-Vitamin D 600-400 MG-UNIT tablet Take 1 tablet by mouth 2 (two) times daily.     Cetirizine HCl 10 MG CAPS Take 1 capsule (10 mg total) by mouth daily. 30 capsule 1   dexamethasone (DECADRON) 4 MG tablet Take 5 tablets (20 mg total) by mouth once a week. On D22 of each cycle of treatment 20 tablet 5   ELIQUIS 2.5 MG TABS tablet TAKE 1 TABLET(2.5 MG) BY MOUTH TWICE DAILY 60 tablet 2   fentaNYL (DURAGESIC) 12 MCG/HR Place 1 patch onto the skin every 3 (three) days. 10 patch 0   glucose blood (FREESTYLE LITE) test strip CHECK BLOOD SUGAR TWICE DAILY AS DIRECTED Dx 250.00 180 each 3   Lancets (FREESTYLE) lancets Use twice daily to check sugars. 100 each 11   lidocaine-prilocaine (EMLA) cream APPLY 1 APPLICATION TO THE AFFECTED AREA AS NEEDED. USE PRIOR TO PORT ACCESS 30 g 0   metFORMIN (GLUCOPHAGE-XR) 500 MG 24 hr tablet TAKE 3 TABLETS(1500 MG) BY MOUTH DAILY WITH BREAKFAST (Patient taking differently: Take 1 tablet in AM and 1 Tablet in PM) 90 tablet 1   Multiple Vitamins-Minerals (ICAPS) CAPS Take 1 capsule by mouth daily after breakfast.     ondansetron (ZOFRAN) 8 MG tablet Take 1 tablet (8 mg total) by mouth 2  (two) times daily as needed (Nausea or vomiting). 30 tablet 1   pantoprazole (PROTONIX) 20 MG tablet TAKE 1 TABLET(20 MG) BY MOUTH DAILY 30 tablet 5   polyethylene glycol (MIRALAX / GLYCOLAX) packet Take 17 g by mouth daily after breakfast.     potassium chloride SA (KLOR-CON M) 20 MEQ tablet TAKE 2 TABLETS BY MOUTH TWICE DAILY 360 tablet 1   prochlorperazine (COMPAZINE) 10 MG tablet Take 1 tablet (10 mg total) by mouth every 6 (six) hours as needed (Nausea or vomiting). 30 tablet 1   senna-docusate (SENNA S) 8.6-50 MG tablet Take 2 tablets by mouth at bedtime. 60 tablet 2   sertraline (ZOLOFT) 100 MG tablet Take 1 tablet (100 mg total) by mouth daily. 30 tablet 3   simvastatin (ZOCOR) 20 MG  tablet TAKE 1 TABLET BY MOUTH EVERY DAY AT 6 PM 90 tablet 3   Vitamin D, Ergocalciferol, (DRISDOL) 1.25 MG (50000 UNIT) CAPS capsule Take 1 capsule (50,000 Units total) by mouth every 7 (seven) days. 12 capsule 0   clindamycin (CLEOCIN) 300 MG capsule Take 2 capsules (600 mg total) by mouth once as needed for up to 1 dose (1 hour prior to dental procedures). (Patient not taking: Reported on 01/08/2022) 2 capsule 0   fluticasone (FLONASE) 50 MCG/ACT nasal spray Place 1 spray into both nostrils daily. (Patient not taking: Reported on 01/08/2022) 16 g 2   lenalidomide (REVLIMID) 5 MG capsule Take 1 capsule (5 mg total) by mouth daily. Take 1 capsule by mouth for 21 days , then take 7 days off. 21 capsule 0   meclizine (ANTIVERT) 25 MG tablet Take 1 tablet (25 mg total) by mouth 3 (three) times daily as needed for dizziness (vertigo). 30 tablet 0   Oxycodone HCl 10 MG TABS Take 1 tablet (10 mg total) by mouth every 6 (six) hours as needed. 90 tablet 0   Triamcinolone Acetonide 0.025 % LOTN Apply 1 application topically 3 (three) times daily as needed (rash/itching). (Patient not taking: Reported on 01/08/2022) 60 mL 2   No current facility-administered medications for this visit.   Facility-Administered Medications  Ordered in Other Visits  Medication Dose Route Frequency Provider Last Rate Last Admin   heparin lock flush 100 unit/mL  500 Units Intracatheter Once PRN Brunetta Genera, MD       sodium chloride flush (NS) 0.9 % injection 10 mL  10 mL Intracatheter PRN Brunetta Genera, MD   10 mL at 10/02/19 1524    REVIEW OF SYSTEMS:   .10 Point review of Systems was done is negative except as noted above.  PHYSICAL EXAMINATION: ECOG FS:2 - Symptomatic, <50% confined to bed  Vitals:   01/08/22 1245  BP: 123/65  Pulse: 81  Resp: 18  Temp: (!) 97.5 F (36.4 C)  SpO2: 98%    Wt Readings from Last 3 Encounters:  01/20/22 148 lb 12 oz (67.5 kg)  01/08/22 146 lb 14.4 oz (66.6 kg)  01/06/22 143 lb 6.4 oz (65 kg)   Body mass index is 29.67 kg/m.    GENERAL:alert, in no acute distress and comfortable SKIN: no acute rashes, no significant lesions EYES: conjunctiva are pink and non-injected, sclera anicteric OROPHARYNX: MMM, no exudates, no oropharyngeal erythema or ulceration NECK: supple, no JVD LYMPH:  no palpable lymphadenopathy in the cervical, axillary or inguinal regions LUNGS: clear to auscultation b/l with normal respiratory effort HEART: regular rate & rhythm ABDOMEN:  normoactive bowel sounds , non tender, not distended. Extremity: no pedal edema PSYCH: alert & oriented x 3 with fluent speech NEURO: no focal motor/sensory deficits  LABORATORY DATA:  I have reviewed the data as listed  . CBC Latest Ref Rng & Units 01/08/2022 12/11/2021  WBC 4.0 - 10.5 K/uL 2.3(L) 2.2(L)  Hemoglobin 12.0 - 15.0 g/dL 10.3(L) 12.2  Hematocrit 36.0 - 46.0 % 31.1(L) 35.4(L)  Platelets 150 - 400 K/uL 69(L) 70(L)   . CBC    Component Value Date/Time   WBC 2.4 (L) 01/20/2022 1252   WBC 2.2 (L) 12/11/2021 2254   RBC 2.97 (L) 01/20/2022 1252   HGB 10.5 (L) 01/20/2022 1252   HCT 32.0 (L) 01/20/2022 1252   PLT 131 (L) 01/20/2022 1252   MCV 107.7 (H) 01/20/2022 1252   MCH 35.4 (H)  01/20/2022 1252  MCHC 32.8 01/20/2022 1252   RDW 15.3 01/20/2022 1252   LYMPHSABS 0.6 (L) 01/20/2022 1252   MONOABS 0.4 01/20/2022 1252   EOSABS 0.1 01/20/2022 1252   BASOSABS 0.0 01/20/2022 1252    . CMP Latest Ref Rng & Units 01/08/2022 12/11/2021  Glucose 70 - 99 mg/dL 220(H) 146(H)  BUN 8 - 23 mg/dL 11 16  Creatinine 0.44 - 1.00 mg/dL 0.90 1.06(H)  Sodium 135 - 145 mmol/L 135 134(L)  Potassium 3.5 - 5.1 mmol/L 3.9 3.5  Chloride 98 - 111 mmol/L 106 114(H)  CO2 22 - 32 mmol/L 20(L) 10(L)  Calcium 8.9 - 10.3 mg/dL 8.8(L) 9.3  Total Protein 6.5 - 8.1 g/dL 6.0(L) -  Total Bilirubin 0.3 - 1.2 mg/dL 0.7 -  Alkaline Phos 38 - 126 U/L 87 -  AST 15 - 41 U/L 15 -  ALT 0 - 44 U/L 12 -   09/18/2019 BM Bx Report ([id number])   09/18/2019 FISH Panel    05/30/2019 BM Bx   01/06/2019 BM Bx:     01/06/19 Cytogenetics:      05/30/19 BM Biopsy:   09/18/2019 FISH Panel    09/18/2019 BM Surgical Pathology [id number])     RADIOGRAPHIC STUDIES: I have personally reviewed the radiological images as listed and agreed with the findings in the report. No results found.   ASSESSMENT & PLAN:   81 y.o. female with  1.  Nonsecretory multiple Myeloma, RISS Stage III  Labs upon initial presentation from 12/08/18, blood counts are normal including WBC at 7.1k, HGB at 13.1, and PLT at 245k. Calcium normal at 10.3. Creatinine normal at 0.63. M spike at 0.5g. 12/13/18 Bone Scan revealed Multifocal uptake throughout the skeleton, consistent with diffuse metastatic disease. Primary tumor is not specified. 2. Uptake in the proximal right femur, consistent with lytic lesions. 3. Uptake in the ribs bilaterally as described. 4. Lesions in the proximal left humerus. 5. Diffuse uptake throughout the skull consistent with metastatic disease. 6. Right paramedian uptake at the manubrium.  12/13/18 CT Right Femur revealed Numerous lytic lesions involving the right femur and a lytic  lesion in the left inferior pubic ramus. Overall appearance is most concerning for multiple myeloma  12/27/18 Pretreatment 24hour UPEP observed an M spike at 78m, and showed 1917mtotal protein/day.  12/27/18 Pretreatment MMP revealed M Protein at 0.5g with IgG Lambda specificity. Kappa:Lambda light chain ratio at 0.13, with Lambda at 40.3. There is less abnormal protein and light chains than I would expect from 30% plasma cells, which suggests hypo-secretory or non-secretory neoplastic plasma cells. Will have an impact in assessing response. 01/05/19 PET/CT revealed Innumerable lytic lesions in the skeleton compatible with myeloma. Most of the larger lesions are hypermetabolic, for example including a left proximal humeral shaft lesion with maximum SUV of 8.1 and a 2.8 cm lesion in the left T9 vertebral body with maximum SUV 5.1. Most of the smaller lytic lesions, and some of the larger lesions, do not demonstrate accentuated metabolic activity. 2. 1.2 cm in short axis lymph node in the left parapharyngeal space is hypermetabolic with maximum SUV 11.8. I do not see a separate mass in the head and neck to give rise to this hypermetabolic lymph node. 3. Mosaic attenuation in the lower lobes, nonspecific possibly from air trapping. 4.  Aortic Atherosclerosis 5. Heterogeneous activity in the liver, making it hard to exclude small liver lesions. Consider hepatic protocol MRI with and without contrast for definitive assessment. Nonobstructive right nephrolithiasis. Old granulomatous disease  01/06/19 Bone Marrow biopsy revealed interstitial increase in plasma cells (28% aspirate, 40% CD138 immunohistochemistry). Plasma cells negative for light chains consistent with a non or weakly secretory myeloma   01/06/19 Cytogenetics revealed 37% of cells with trisomy 11 or 11q deletion, and 40.5% of cells with 17p mutation  S/p 5 cycles of KRD treatment  05/31/19 BM Biopsy revealed mild atypical plasmacytosis at 5% with  polytypic variation.   06/01/19 PET/CT revealed "Dominant lesion in the LEFT humerus is decreased significantly in metabolic activity. Additional hypermetabolic skeletal lytic lesions have decreased in metabolic activity or similar to comparison exam (01/05/2019). No evidence of disease progression. 2. Multiple additional lytic lesions do not have metabolic activity and unchanged. 3. No new skeletal lesions are identified. No soft tissue plasmacytoma identified. 4. Nodule / node in the LEFT parapharyngeal space which is intensely hypermetabolic not changed from prior. 5. New hypermetabolic LEFT lower lobe pulmonary nodule is indeterminate. Recommend close attention on follow-up 6. New obstructive hydronephrosis of the RIGHT kidney related to RIGHT UPJ stone."  09/18/2019 BM Bx Report which revealed "Slightly hypercellular bone marrow for age with trilineage hematopoiesis and 1% plasma cells."  09/14/2019 PET/CT Whole Body Scan ([id number]) which revealed "1. There widespread tiny lytic lesions compatible with multiple myeloma. Index larger lesions are generally similar to the prior exam, with low-grade activity such as the left T9 vertebral body lesion with maximum SUV 4.5. Is mild increase in the activity associated with a mildly sclerotic left proximal humeral lesion, maximum SUV 4.8 (previously 3.5). 2. At the site of the prior left lower lobe nodule is currently more bandlike thickening, with maximum SUV only 1.9, probably benign, continued surveillance of this region suggested. 3. There several small but hypermetabolic lymph nodes. This includes a left parapharyngeal space node measuring 1.0 cm with maximum SUV 12.3 (stable); a left level IB lymph node measuring 0.5 cm with maximum SUV 4.8 (slightly larger than prior); and a left inguinal lymph node measuring 0.7 cm in short axis with maximum SUV 6.4 (previously 0.5 cm with maximum SUV 0.6). Significance of these lymph nodes uncertain, surveillance is  recommended. 4. New 5 mm left lower lobe subpleural nodule on image 32/8, not appreciably hypermetabolic, surveillance suggested. 5. Focal subcutaneous stranding along the left perineum measuring about 2.6 by 1.1 cm on image 221/4, maximum SUV 12.5. This was not present previously and is most likely inflammatory, although given the notable SUV, surveillance of this region is suggested. 6. Other imaging findings of potential clinical significance: Aortic Atherosclerosis (ICD10-I70.0). Coronary atherosclerosis. Old granulomatous disease. Mild right hydronephrosis due to a 7 mm right UPJ calculus. 2 mm right kidney upper pole nonobstructive renal calculus. Prominent stool throughout the colon favors constipation."  12/19/2019 Thoracic & Lumbar Spine MRI ([id number]) ([id number]) revealed "Suspected myeloma lesions at T9 and S1. No compression deformity or epidural disease.  01/18/2020 PET/CT ([id number]) which revealed "1. Stable lytic lesions throughout the skeleton. The larger lytic lesions which had mild metabolic activity on comparison exam now have background metabolic activity. No evidence of active myeloma. No evidence of progression multiple myeloma.  No plasmacytoma 3. Hypermetabolic nodules in the LEFT neck may be associated deep tissues of the LEFT parotid gland. 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  01/17/19 MRI Liver revealed Several appreciable liver lesions all have benign imaging characteristics. No MRI findings of  metastatic involvement of the liver. 2. Scattered bony lesions corresponding to the lytic lesions seen at PET-CT, compatible with active myeloma. 3. Aortic Atherosclerosis.  Mild cardiomegaly. 4. Diffuse hepatic steatosis.   3. Left lower lobe pulmonary nodule First seen on 06/01/19  PET/CT PET/CT 6/31/4970: No hypermetabolic mediastinal or hilar nodes. No suspicious pulmonary nodules on the CT scan.  4. 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 Being managed conservatively as per patient's preference.  No symptoms from this currently.  5.  Recent influenza A infection and multilobar pneumonia 12/12/2021.  Now resolved PLAN: -Labs today showed CBC with a hemoglobin of 10.3, leukopenia with a WBC count of 2.3k with ANC of 1000 and platelets of 69k CMP showed normal creatinine of 0.9 blood sugar of 220k. -Patient was recommended to hold her Revlimid and we shall hold her carfilzomib dose to allow for count recovery and continued improvement in her fatigue after her recent influenza A and multilobar pneumonia. -We will continue to track her counts and might need dose reductions on her carfilzomib. -Her Revlimid has already been dose reduced to 5 mg 3 weeks on 1 week off. -Continue 2.5 Eliquis BID for VTE prophylaxis -Continue Potassium daily.  -Continue Zometa q4weeks  FOLLOW UP: Continue carfilzomib maintenance every 2 weeks. Hold Revlimid for 1 to 2 weeks MD visit in 4 weeks.  All of the patient's questions were answered with apparent satisfaction. The patient knows to call the clinic with any problems, questions or concerns.  . The total time spent in the appointment was 30 minutes*   Sullivan Lone MD MS AAHIVMS [facility] Hematology/Oncology Physician [facility]  .*Total Encounter Time as defined by the Centers for Medicare and Medicaid Services includes, in addition to the face-to-face time of a patient visit (documented in the note above) non-face-to-face time: obtaining and reviewing outside history, ordering and reviewing medications, tests or procedures, care coordination (communications with other health care professionals or caregivers) and documentation in  the medical record.

## 2022-01-15 ENCOUNTER — Inpatient Hospital Stay: Payer: Medicare PPO

## 2022-01-15 ENCOUNTER — Other Ambulatory Visit: Payer: Self-pay | Admitting: Hematology

## 2022-01-19 ENCOUNTER — Telehealth: Payer: Self-pay | Admitting: *Deleted

## 2022-01-19 ENCOUNTER — Encounter: Payer: Self-pay | Admitting: Hematology

## 2022-01-19 NOTE — Telephone Encounter (Signed)
Pt sent MyChart message to f/u with rescheduling of tx. Per Delle Reining, pt was added for 2/7 for 1:30pm. Pt was called and advised of schedule changes. Pt verbalized understanding.

## 2022-01-20 ENCOUNTER — Inpatient Hospital Stay: Payer: Medicare PPO | Attending: Hematology

## 2022-01-20 ENCOUNTER — Encounter: Payer: Self-pay | Admitting: Hematology

## 2022-01-20 ENCOUNTER — Other Ambulatory Visit: Payer: Self-pay

## 2022-01-20 ENCOUNTER — Inpatient Hospital Stay: Payer: Medicare PPO

## 2022-01-20 VITALS — BP 138/68 | HR 73 | Temp 98.2°F | Resp 18 | Wt 148.8 lb

## 2022-01-20 DIAGNOSIS — Z5112 Encounter for antineoplastic immunotherapy: Secondary | ICD-10-CM | POA: Insufficient documentation

## 2022-01-20 DIAGNOSIS — Z7189 Other specified counseling: Secondary | ICD-10-CM

## 2022-01-20 DIAGNOSIS — Z95828 Presence of other vascular implants and grafts: Secondary | ICD-10-CM

## 2022-01-20 DIAGNOSIS — Z79899 Other long term (current) drug therapy: Secondary | ICD-10-CM | POA: Insufficient documentation

## 2022-01-20 DIAGNOSIS — C9000 Multiple myeloma not having achieved remission: Secondary | ICD-10-CM | POA: Insufficient documentation

## 2022-01-20 LAB — CBC WITH DIFFERENTIAL (CANCER CENTER ONLY)
Abs Immature Granulocytes: 0 10*3/uL (ref 0.00–0.07)
Basophils Absolute: 0 10*3/uL (ref 0.0–0.1)
Basophils Relative: 1 %
Eosinophils Absolute: 0.1 10*3/uL (ref 0.0–0.5)
Eosinophils Relative: 3 %
HCT: 32 % — ABNORMAL LOW (ref 36.0–46.0)
Hemoglobin: 10.5 g/dL — ABNORMAL LOW (ref 12.0–15.0)
Immature Granulocytes: 0 %
Lymphocytes Relative: 24 %
Lymphs Abs: 0.6 10*3/uL — ABNORMAL LOW (ref 0.7–4.0)
MCH: 35.4 pg — ABNORMAL HIGH (ref 26.0–34.0)
MCHC: 32.8 g/dL (ref 30.0–36.0)
MCV: 107.7 fL — ABNORMAL HIGH (ref 80.0–100.0)
Monocytes Absolute: 0.4 10*3/uL (ref 0.1–1.0)
Monocytes Relative: 18 %
Neutro Abs: 1.3 10*3/uL — ABNORMAL LOW (ref 1.7–7.7)
Neutrophils Relative %: 54 %
Platelet Count: 131 10*3/uL — ABNORMAL LOW (ref 150–400)
RBC: 2.97 MIL/uL — ABNORMAL LOW (ref 3.87–5.11)
RDW: 15.3 % (ref 11.5–15.5)
WBC Count: 2.4 10*3/uL — ABNORMAL LOW (ref 4.0–10.5)
nRBC: 0 % (ref 0.0–0.2)

## 2022-01-20 LAB — CMP (CANCER CENTER ONLY)
ALT: 27 U/L (ref 0–44)
AST: 26 U/L (ref 15–41)
Albumin: 3.5 g/dL (ref 3.5–5.0)
Alkaline Phosphatase: 95 U/L (ref 38–126)
Anion gap: 8 (ref 5–15)
BUN: 19 mg/dL (ref 8–23)
CO2: 21 mmol/L — ABNORMAL LOW (ref 22–32)
Calcium: 9.2 mg/dL (ref 8.9–10.3)
Chloride: 110 mmol/L (ref 98–111)
Creatinine: 1.04 mg/dL — ABNORMAL HIGH (ref 0.44–1.00)
GFR, Estimated: 55 mL/min — ABNORMAL LOW (ref >60)
Glucose, Bld: 208 mg/dL — ABNORMAL HIGH (ref 70–99)
Potassium: 4.2 mmol/L (ref 3.5–5.1)
Sodium: 139 mmol/L (ref 135–145)
Total Bilirubin: 0.5 mg/dL (ref 0.3–1.2)
Total Protein: 6.4 g/dL — ABNORMAL LOW (ref 6.5–8.1)

## 2022-01-20 MED ORDER — ZOLEDRONIC ACID 4 MG/100ML IV SOLN
4.0000 mg | Freq: Once | INTRAVENOUS | Status: AC
  Administered 2022-01-20: 4 mg via INTRAVENOUS
  Filled 2022-01-20: qty 100

## 2022-01-20 MED ORDER — ACETAMINOPHEN 325 MG PO TABS
650.0000 mg | ORAL_TABLET | Freq: Once | ORAL | Status: AC
  Administered 2022-01-20: 650 mg via ORAL
  Filled 2022-01-20: qty 2

## 2022-01-20 MED ORDER — SODIUM CHLORIDE 0.9% FLUSH
10.0000 mL | Freq: Once | INTRAVENOUS | Status: AC
  Administered 2022-01-20: 10 mL

## 2022-01-20 MED ORDER — PROCHLORPERAZINE MALEATE 10 MG PO TABS
10.0000 mg | ORAL_TABLET | Freq: Once | ORAL | Status: AC
  Administered 2022-01-20: 10 mg via ORAL
  Filled 2022-01-20: qty 1

## 2022-01-20 MED ORDER — SODIUM CHLORIDE 0.9% FLUSH
10.0000 mL | INTRAVENOUS | Status: DC | PRN
  Administered 2022-01-20: 10 mL

## 2022-01-20 MED ORDER — DEXTROSE 5 % IV SOLN
56.0000 mg/m2 | Freq: Once | INTRAVENOUS | Status: AC
  Administered 2022-01-20: 100 mg via INTRAVENOUS
  Filled 2022-01-20: qty 30

## 2022-01-20 MED ORDER — SODIUM CHLORIDE 0.9 % IV SOLN: Freq: Once | INTRAVENOUS | Status: AC

## 2022-01-20 MED ORDER — FAMOTIDINE 20 MG PO TABS
20.0000 mg | ORAL_TABLET | Freq: Once | ORAL | Status: AC
  Administered 2022-01-20: 20 mg via ORAL
  Filled 2022-01-20: qty 1

## 2022-01-20 MED ORDER — DEXAMETHASONE 4 MG PO TABS
12.0000 mg | ORAL_TABLET | Freq: Once | ORAL | Status: AC
  Administered 2022-01-20: 12 mg via ORAL
  Filled 2022-01-20: qty 3

## 2022-01-20 MED ORDER — DIPHENHYDRAMINE HCL 25 MG PO CAPS
25.0000 mg | ORAL_CAPSULE | Freq: Once | ORAL | Status: AC
  Administered 2022-01-20: 25 mg via ORAL
  Filled 2022-01-20: qty 1

## 2022-01-20 MED ORDER — HEPARIN SOD (PORK) LOCK FLUSH 100 UNIT/ML IV SOLN
500.0000 [IU] | Freq: Once | INTRAVENOUS | Status: AC | PRN
  Administered 2022-01-20: 500 [IU]

## 2022-01-20 NOTE — Progress Notes (Signed)
Per Dr. Irene Limbo, [place] to trt w/ ANC 1.3

## 2022-01-20 NOTE — Patient Instructions (Addendum)
[facility] ONCOLOGY  Discharge Instructions: Thank you for choosing [facility] to provide your oncology and hematology care.   If you have a lab appointment with the [facility], please go directly to the [facility] and check in at the registration area.   Wear comfortable clothing and clothing appropriate for easy access to any Portacath or PICC line.   We strive to give you quality time with your provider. You may need to reschedule your appointment if you arrive late (15 or more minutes).  Arriving late affects you and other patients whose appointments are after yours.  Also, if you miss three or more appointments without notifying the office, you may be dismissed from the clinic at the providers discretion.      For prescription refill requests, have your pharmacy contact our office and allow 72 hours for refills to be completed.    Today you received the following chemotherapy and/or immunotherapy agents: Kyprolis    To help prevent nausea and vomiting after your treatment, we encourage you to take your nausea medication as directed.  BELOW ARE SYMPTOMS THAT SHOULD BE REPORTED IMMEDIATELY: *FEVER GREATER THAN 100.4 F (38 C) OR HIGHER *CHILLS OR SWEATING *NAUSEA AND VOMITING THAT IS NOT CONTROLLED WITH YOUR NAUSEA MEDICATION *UNUSUAL SHORTNESS OF BREATH *UNUSUAL BRUISING OR BLEEDING *URINARY PROBLEMS (pain or burning when urinating, or frequent urination) *BOWEL PROBLEMS (unusual diarrhea, constipation, pain near the anus) TENDERNESS IN MOUTH AND THROAT WITH OR WITHOUT PRESENCE OF ULCERS (sore throat, sores in mouth, or a toothache) UNUSUAL RASH, SWELLING OR PAIN  UNUSUAL VAGINAL DISCHARGE OR ITCHING   Items with * indicate a potential emergency and should be followed up as soon as possible or go to the Emergency Department if any problems should occur.  Please show the CHEMOTHERAPY ALERT CARD or IMMUNOTHERAPY ALERT CARD at check-in to the  Emergency Department and triage nurse.  Should you have questions after your visit or need to cancel or reschedule your appointment, please contact [facility]  Dept: [phone]  and follow the prompts.  Office hours are 8:00 a.m. to 4:30 p.m. Monday - Friday. Please note that voicemails left after 4:00 p.m. may not be returned until the following business day.  We are closed weekends and major holidays. You have access to a nurse at all times for urgent questions. Please call the main number to the clinic Dept: [phone] and follow the prompts.   For any non-urgent questions, you may also contact your provider using MyChart. We now offer e-Visits for anyone 65 and older to request care online for non-urgent symptoms. For details visit [email or web address].   Also download the MyChart app! Go to the app store, search "MyChart", open the app, select [facility], and log in with your MyChart username and password.  Due to Covid, a mask is required upon entering the hospital/clinic. If you do not have a mask, one will be given to you upon arrival. For doctor visits, patients may have 1 support person aged 57 or older with them. For treatment visits, patients cannot have anyone with them due to current Covid guidelines and our immunocompromised population.   Zoledronic Acid Injection (Hypercalcemia, Oncology) What is this medication? ZOLEDRONIC ACID (ZOE le dron ik AS id) slows calcium loss from bones. It high calcium levels in the blood from some kinds of cancer. It may be used in other people at risk for bone loss. This medicine may be used  for other purposes; ask your health care provider or pharmacist if you have questions. COMMON BRAND NAME(S): Zometa What should I tell my care team before I take this medication? They need to know if you have any of these conditions: cancer dehydration dental disease kidney disease liver disease low levels of calcium in  the blood lung or breathing disease (asthma) receiving steroids like dexamethasone or prednisone an unusual or allergic reaction to zoledronic acid, other medicines, foods, dyes, or preservatives pregnant or trying to get pregnant breast-feeding How should I use this medication? This drug is injected into a vein. It is given by a health care provider in a hospital or clinic setting. Talk to your health care provider about the use of this drug in children. Special care may be needed. Overdosage: If you think you have taken too much of this medicine contact a poison control center or emergency room at once. NOTE: This medicine is only for you. Do not share this medicine with others. What if I miss a dose? Keep appointments for follow-up doses. It is important not to miss your dose. Call your health care provider if you are unable to keep an appointment. What may interact with this medication? certain antibiotics given by injection NSAIDs, medicines for pain and inflammation, like ibuprofen or naproxen some diuretics like bumetanide, furosemide teriparatide thalidomide This list may not describe all possible interactions. Give your health care provider a list of all the medicines, herbs, non-prescription drugs, or dietary supplements you use. Also tell them if you smoke, drink alcohol, or use illegal drugs. Some items may interact with your medicine. What should I watch for while using this medication? Visit your health care provider for regular checks on your progress. It may be some time before you see the benefit from this drug. Some people who take this drug have severe bone, joint, or muscle pain. This drug may also increase your risk for jaw problems or a broken thigh bone. Tell your health care provider right away if you have severe pain in your jaw, bones, joints, or muscles. Tell you health care provider if you have any pain that does not go away or that gets worse. Tell your dentist and  dental surgeon that you are taking this drug. You should not have major dental surgery while on this drug. See your dentist to have a dental exam and fix any dental problems before starting this drug. Take good care of your teeth while on this drug. Make sure you see your dentist for regular follow-up appointments. You should make sure you get enough calcium and vitamin D while you are taking this drug. Discuss the foods you eat and the vitamins you take with your health care provider. Check with your health care provider if you have severe diarrhea, nausea, and vomiting, or if you sweat a lot. The loss of too much body fluid may make it dangerous for you to take this drug. You may need blood work done while you are taking this drug. Do not become pregnant while taking this drug. Women should inform their health care provider if they wish to become pregnant or think they might be pregnant. There is potential for serious harm to an unborn child. Talk to your health care provider for more information. What side effects may I notice from receiving this medication? Side effects that you should report to your doctor or health care provider as soon as possible: allergic reactions (skin rash, itching or hives; swelling of  the face, lips, or tongue) bone pain infection (fever, chills, cough, sore throat, pain or trouble passing urine) jaw pain, especially after dental work joint pain kidney injury (trouble passing urine or change in the amount of urine) low blood pressure (dizziness; feeling faint or lightheaded, falls; unusually weak or tired) low calcium levels (fast heartbeat; muscle cramps or pain; pain, tingling, or numbness in the hands or feet; seizures) low magnesium levels (fast, irregular heartbeat; muscle cramp or pain; muscle weakness; tremors; seizures) low red blood cell counts (trouble breathing; feeling faint; lightheaded, falls; unusually weak or tired) muscle pain redness, blistering,  peeling, or loosening of the skin, including inside the mouth severe diarrhea swelling of the ankles, feet, hands trouble breathing Side effects that usually do not require medical attention (report to your doctor or health care provider if they continue or are bothersome): anxious constipation coughing depressed mood eye irritation, itching, or pain fever general ill feeling or flu-like symptoms nausea pain, redness, or irritation at site where injected trouble sleeping This list may not describe all possible side effects. Call your doctor for medical advice about side effects. You may report side effects to FDA at 1-800-FDA-1088. Where should I keep my medication? This drug is given in a hospital or clinic. It will not be stored at home. NOTE: This sheet is a summary. It may not cover all possible information. If you have questions about this medicine, talk to your doctor, pharmacist, or health care provider.  2022 Elsevier/Gold Standard (2021-08-19 00:00:00)

## 2022-01-30 ENCOUNTER — Telehealth: Payer: Self-pay | Admitting: Hematology

## 2022-01-30 NOTE — Telephone Encounter (Signed)
Called patient to schedule appointment per 2/16 inbasket, patient is aware of date and time.   °

## 2022-02-05 ENCOUNTER — Other Ambulatory Visit: Payer: Self-pay | Admitting: Hematology

## 2022-02-05 ENCOUNTER — Ambulatory Visit: Payer: Medicare PPO

## 2022-02-05 DIAGNOSIS — C9000 Multiple myeloma not having achieved remission: Secondary | ICD-10-CM

## 2022-02-06 ENCOUNTER — Other Ambulatory Visit: Payer: Self-pay | Admitting: Hematology

## 2022-02-06 DIAGNOSIS — C9000 Multiple myeloma not having achieved remission: Secondary | ICD-10-CM

## 2022-02-12 ENCOUNTER — Inpatient Hospital Stay: Payer: Medicare PPO

## 2022-02-12 ENCOUNTER — Other Ambulatory Visit: Payer: Self-pay | Admitting: Hematology

## 2022-02-12 ENCOUNTER — Other Ambulatory Visit: Payer: Medicare PPO

## 2022-02-12 ENCOUNTER — Other Ambulatory Visit: Payer: Self-pay

## 2022-02-12 ENCOUNTER — Inpatient Hospital Stay: Payer: Medicare PPO | Attending: Hematology

## 2022-02-12 VITALS — BP 128/76 | HR 88 | Temp 97.9°F | Resp 18 | Wt 143.5 lb

## 2022-02-12 DIAGNOSIS — C9000 Multiple myeloma not having achieved remission: Secondary | ICD-10-CM | POA: Insufficient documentation

## 2022-02-12 DIAGNOSIS — Z7189 Other specified counseling: Secondary | ICD-10-CM

## 2022-02-12 DIAGNOSIS — Z95828 Presence of other vascular implants and grafts: Secondary | ICD-10-CM

## 2022-02-12 DIAGNOSIS — Z5112 Encounter for antineoplastic immunotherapy: Secondary | ICD-10-CM | POA: Diagnosis not present

## 2022-02-12 DIAGNOSIS — Z79899 Other long term (current) drug therapy: Secondary | ICD-10-CM | POA: Insufficient documentation

## 2022-02-12 LAB — CBC WITH DIFFERENTIAL (CANCER CENTER ONLY)
Abs Immature Granulocytes: 0.01 10*3/uL (ref 0.00–0.07)
Basophils Absolute: 0 10*3/uL (ref 0.0–0.1)
Basophils Relative: 0 %
Eosinophils Absolute: 0.1 10*3/uL (ref 0.0–0.5)
Eosinophils Relative: 1 %
HCT: 36.8 % (ref 36.0–46.0)
Hemoglobin: 12.1 g/dL (ref 12.0–15.0)
Immature Granulocytes: 0 %
Lymphocytes Relative: 16 %
Lymphs Abs: 0.8 10*3/uL (ref 0.7–4.0)
MCH: 34.6 pg — ABNORMAL HIGH (ref 26.0–34.0)
MCHC: 32.9 g/dL (ref 30.0–36.0)
MCV: 105.1 fL — ABNORMAL HIGH (ref 80.0–100.0)
Monocytes Absolute: 1 10*3/uL (ref 0.1–1.0)
Monocytes Relative: 20 %
Neutro Abs: 2.9 10*3/uL (ref 1.7–7.7)
Neutrophils Relative %: 63 %
Platelet Count: 102 10*3/uL — ABNORMAL LOW (ref 150–400)
RBC: 3.5 MIL/uL — ABNORMAL LOW (ref 3.87–5.11)
RDW: 13.9 % (ref 11.5–15.5)
WBC Count: 4.7 10*3/uL (ref 4.0–10.5)
nRBC: 0 % (ref 0.0–0.2)

## 2022-02-12 LAB — CMP (CANCER CENTER ONLY)
ALT: 11 U/L (ref 0–44)
AST: 11 U/L — ABNORMAL LOW (ref 15–41)
Albumin: 4 g/dL (ref 3.5–5.0)
Alkaline Phosphatase: 95 U/L (ref 38–126)
Anion gap: 9 (ref 5–15)
BUN: 15 mg/dL (ref 8–23)
CO2: 23 mmol/L (ref 22–32)
Calcium: 10.5 mg/dL — ABNORMAL HIGH (ref 8.9–10.3)
Chloride: 107 mmol/L (ref 98–111)
Creatinine: 1.13 mg/dL — ABNORMAL HIGH (ref 0.44–1.00)
GFR, Estimated: 50 mL/min — ABNORMAL LOW (ref >60)
Glucose, Bld: 209 mg/dL — ABNORMAL HIGH (ref 70–99)
Potassium: 4 mmol/L (ref 3.5–5.1)
Sodium: 139 mmol/L (ref 135–145)
Total Bilirubin: 0.6 mg/dL (ref 0.3–1.2)
Total Protein: 6.7 g/dL (ref 6.5–8.1)

## 2022-02-12 MED ORDER — ZOLEDRONIC ACID 4 MG/100ML IV SOLN
4.0000 mg | Freq: Once | INTRAVENOUS | Status: AC
  Administered 2022-02-12: 4 mg via INTRAVENOUS
  Filled 2022-02-12: qty 100

## 2022-02-12 MED ORDER — HEPARIN SOD (PORK) LOCK FLUSH 100 UNIT/ML IV SOLN
500.0000 [IU] | Freq: Once | INTRAVENOUS | Status: AC | PRN
  Administered 2022-02-12: 500 [IU]

## 2022-02-12 MED ORDER — SODIUM CHLORIDE 0.9 % IV SOLN: Freq: Once | INTRAVENOUS | Status: AC

## 2022-02-12 MED ORDER — SODIUM CHLORIDE 0.9% FLUSH
3.0000 mL | Freq: Once | INTRAVENOUS | Status: AC | PRN
  Administered 2022-02-12: 3 mL

## 2022-02-12 MED ORDER — SODIUM CHLORIDE 0.9% FLUSH
10.0000 mL | Freq: Once | INTRAVENOUS | Status: AC
  Administered 2022-02-12: 10 mL

## 2022-02-12 NOTE — Progress Notes (Signed)
Per Dr. Lorenso Courier (Dr. Irene Limbo out of the office), no Kyprolis today.  Pt needs evaluation by Dr. Irene Limbo next week.   ? ?Kennith Center, Pharm.D., CPP ?02/12/2022@4 :31 PM ? ? ?

## 2022-02-12 NOTE — Patient Instructions (Signed)
[facility]  Discharge Instructions: Thank you for choosing [facility] to provide your oncology and hematology care.   If you have a lab appointment with the Cancer Center, please go directly to the Cancer Center and check in at the registration area.   Wear comfortable clothing and clothing appropriate for easy access to any Portacath or PICC line.   We strive to give you quality time with your provider. You may need to reschedule your appointment if you arrive late (15 or more minutes).  Arriving late affects you and other patients whose appointments are after yours.  Also, if you miss three or more appointments without notifying the office, you may be dismissed from the clinic at the provider's discretion.      For prescription refill requests, have your pharmacy contact our office and allow 72 hours for refills to be completed.    Today you received the following chemotherapy and/or immunotherapy agents: Zometa      To help prevent nausea and vomiting after your treatment, we encourage you to take your nausea medication as directed.  BELOW ARE SYMPTOMS THAT SHOULD BE REPORTED IMMEDIATELY: *FEVER GREATER THAN 100.4 F (38 C) OR HIGHER *CHILLS OR SWEATING *NAUSEA AND VOMITING THAT IS NOT CONTROLLED WITH YOUR NAUSEA MEDICATION *UNUSUAL SHORTNESS OF BREATH *UNUSUAL BRUISING OR BLEEDING *URINARY PROBLEMS (pain or burning when urinating, or frequent urination) *BOWEL PROBLEMS (unusual diarrhea, constipation, pain near the anus) TENDERNESS IN MOUTH AND THROAT WITH OR WITHOUT PRESENCE OF ULCERS (sore throat, sores in mouth, or a toothache) UNUSUAL RASH, SWELLING OR PAIN  UNUSUAL VAGINAL DISCHARGE OR ITCHING   Items with * indicate a potential emergency and should be followed up as soon as possible or go to the Emergency Department if any problems should occur.  Please show the CHEMOTHERAPY ALERT CARD or IMMUNOTHERAPY ALERT CARD at check-in to the  Emergency Department and triage nurse.  Should you have questions after your visit or need to cancel or reschedule your appointment, please contact [facility]  Dept: [phone]  and follow the prompts.  Office hours are 8:00 a.m. to 4:30 p.m. Monday - Friday. Please note that voicemails left after 4:00 p.m. may not be returned until the following business day.  We are closed weekends and major holidays. You have access to a nurse at all times for urgent questions. Please call the main number to the clinic Dept: [phone] and follow the prompts.   For any non-urgent questions, you may also contact your provider using MyChart. We now offer e-Visits for anyone 18 and older to request care online for non-urgent symptoms. For details visit [email or web address].   Also download the MyChart app! Go to the app store, search "MyChart", open the app, select [facility], and log in with your MyChart username and password.  Due to Covid, a mask is required upon entering the hospital/clinic. If you do not have a mask, one will be given to you upon arrival. For doctor visits, patients may have 1 support Faith Orr aged 18 or older with them. For treatment visits, patients cannot have anyone with them due to current Covid guidelines and our immunocompromised population.  

## 2022-02-16 ENCOUNTER — Other Ambulatory Visit: Payer: Self-pay | Admitting: Hematology

## 2022-02-16 DIAGNOSIS — C9000 Multiple myeloma not having achieved remission: Secondary | ICD-10-CM

## 2022-02-17 ENCOUNTER — Encounter: Payer: Self-pay | Admitting: Hematology

## 2022-02-17 MED ORDER — OXYCODONE HCL 10 MG PO TABS: 10.0000 mg | ORAL_TABLET | Freq: Four times a day (QID) | ORAL | 0 refills | Status: DC | PRN

## 2022-02-26 ENCOUNTER — Inpatient Hospital Stay: Payer: Medicare PPO

## 2022-02-26 ENCOUNTER — Inpatient Hospital Stay: Payer: Medicare PPO | Admitting: Hematology

## 2022-02-26 ENCOUNTER — Other Ambulatory Visit: Payer: Self-pay

## 2022-02-26 VITALS — BP 114/64 | HR 74 | Temp 97.7°F | Resp 18 | Wt 146.9 lb

## 2022-02-26 DIAGNOSIS — Z5111 Encounter for antineoplastic chemotherapy: Secondary | ICD-10-CM

## 2022-02-26 DIAGNOSIS — Z5112 Encounter for antineoplastic immunotherapy: Secondary | ICD-10-CM | POA: Diagnosis not present

## 2022-02-26 DIAGNOSIS — Z79899 Other long term (current) drug therapy: Secondary | ICD-10-CM | POA: Diagnosis not present

## 2022-02-26 DIAGNOSIS — C9000 Multiple myeloma not having achieved remission: Secondary | ICD-10-CM

## 2022-02-26 LAB — CMP (CANCER CENTER ONLY)
ALT: 12 U/L (ref 0–44)
AST: 12 U/L — ABNORMAL LOW (ref 15–41)
Albumin: 3.8 g/dL (ref 3.5–5.0)
Alkaline Phosphatase: 100 U/L (ref 38–126)
Anion gap: 8 (ref 5–15)
BUN: 16 mg/dL (ref 8–23)
CO2: 19 mmol/L — ABNORMAL LOW (ref 22–32)
Calcium: 9.5 mg/dL (ref 8.9–10.3)
Chloride: 110 mmol/L (ref 98–111)
Creatinine: 1.15 mg/dL — ABNORMAL HIGH (ref 0.44–1.00)
GFR, Estimated: 49 mL/min — ABNORMAL LOW (ref >60)
Glucose, Bld: 213 mg/dL — ABNORMAL HIGH (ref 70–99)
Potassium: 3.5 mmol/L (ref 3.5–5.1)
Sodium: 137 mmol/L (ref 135–145)
Total Bilirubin: 0.5 mg/dL (ref 0.3–1.2)
Total Protein: 6.8 g/dL (ref 6.5–8.1)

## 2022-02-26 LAB — CBC WITH DIFFERENTIAL (CANCER CENTER ONLY)
Abs Immature Granulocytes: 0.01 10*3/uL (ref 0.00–0.07)
Basophils Absolute: 0 10*3/uL (ref 0.0–0.1)
Basophils Relative: 1 %
Eosinophils Absolute: 0.1 10*3/uL (ref 0.0–0.5)
Eosinophils Relative: 2 %
HCT: 34.3 % — ABNORMAL LOW (ref 36.0–46.0)
Hemoglobin: 11.5 g/dL — ABNORMAL LOW (ref 12.0–15.0)
Immature Granulocytes: 0 %
Lymphocytes Relative: 29 %
Lymphs Abs: 1.1 10*3/uL (ref 0.7–4.0)
MCH: 34.2 pg — ABNORMAL HIGH (ref 26.0–34.0)
MCHC: 33.5 g/dL (ref 30.0–36.0)
MCV: 102.1 fL — ABNORMAL HIGH (ref 80.0–100.0)
Monocytes Absolute: 0.9 10*3/uL (ref 0.1–1.0)
Monocytes Relative: 24 %
Neutro Abs: 1.7 10*3/uL (ref 1.7–7.7)
Neutrophils Relative %: 44 %
Platelet Count: 138 10*3/uL — ABNORMAL LOW (ref 150–400)
RBC: 3.36 MIL/uL — ABNORMAL LOW (ref 3.87–5.11)
RDW: 13.4 % (ref 11.5–15.5)
WBC Count: 3.8 10*3/uL — ABNORMAL LOW (ref 4.0–10.5)
nRBC: 0 % (ref 0.0–0.2)

## 2022-02-26 MED ORDER — SODIUM CHLORIDE 0.9 % IV SOLN: Freq: Once | INTRAVENOUS | Status: AC

## 2022-02-26 MED ORDER — HEPARIN SOD (PORK) LOCK FLUSH 100 UNIT/ML IV SOLN
500.0000 [IU] | Freq: Once | INTRAVENOUS | Status: AC | PRN
  Administered 2022-02-26: 500 [IU]

## 2022-02-26 MED ORDER — DEXTROSE 5 % IV SOLN
60.0000 mg | Freq: Once | INTRAVENOUS | Status: AC
  Administered 2022-02-26: 60 mg via INTRAVENOUS
  Filled 2022-02-26: qty 30

## 2022-02-26 MED ORDER — ACETAMINOPHEN 325 MG PO TABS
650.0000 mg | ORAL_TABLET | Freq: Once | ORAL | Status: AC
  Administered 2022-02-26: 650 mg via ORAL
  Filled 2022-02-26: qty 2

## 2022-02-26 MED ORDER — DEXAMETHASONE 4 MG PO TABS
12.0000 mg | ORAL_TABLET | Freq: Once | ORAL | Status: AC
  Administered 2022-02-26: 12 mg via ORAL
  Filled 2022-02-26: qty 3

## 2022-02-26 MED ORDER — SODIUM CHLORIDE 0.9% FLUSH
10.0000 mL | Freq: Once | INTRAVENOUS | Status: AC
  Administered 2022-02-26: 10 mL

## 2022-02-26 MED ORDER — SODIUM CHLORIDE 0.9% FLUSH
10.0000 mL | INTRAVENOUS | Status: DC | PRN
  Administered 2022-02-26: 10 mL

## 2022-02-26 MED ORDER — FAMOTIDINE 20 MG PO TABS
20.0000 mg | ORAL_TABLET | Freq: Once | ORAL | Status: AC
  Administered 2022-02-26: 20 mg via ORAL
  Filled 2022-02-26: qty 1

## 2022-02-26 MED ORDER — DIPHENHYDRAMINE HCL 25 MG PO CAPS
25.0000 mg | ORAL_CAPSULE | Freq: Once | ORAL | Status: AC
  Administered 2022-02-26: 25 mg via ORAL
  Filled 2022-02-26: qty 1

## 2022-02-26 MED ORDER — FENTANYL 12 MCG/HR TD PT72: 1.0000 | MEDICATED_PATCH | TRANSDERMAL | 0 refills | Status: DC

## 2022-02-26 MED ORDER — PROCHLORPERAZINE MALEATE 10 MG PO TABS
10.0000 mg | ORAL_TABLET | Freq: Once | ORAL | Status: AC
  Administered 2022-02-26: 10 mg via ORAL
  Filled 2022-02-26: qty 1

## 2022-02-26 NOTE — Progress Notes (Signed)
? ? ?HEMATOLOGY/ONCOLOGY CLINIC NOTE ? ?Date of Service: .01/08/2022 ? ?Patient Care Team: ?Hoyt Koch, MD as PCP - General (Internal Medicine) ?Lafayette Dragon, MD (Inactive) as Consulting Physician (Gastroenterology) ?Megan Salon, MD as Consulting Physician (Gynecology) ?Melrose Nakayama, MD as Consulting Physician (Orthopedic Surgery) ?Deneise Lever, MD as Consulting Physician (Pulmonary Disease) ?[name], MD (Ophthalmology) ? ?CHIEF COMPLAINTS/PURPOSE OF CONSULTATION:  ?Follow-up for continued evaluation and management of multiple myeloma ? ?HISTORY OF PRESENTING ILLNESS:  ?Please see previous notes for details on initial presentation ? ?Current Treatment: ?Carfilzomib + Revlimid maintenance. ? ?INTERVAL HISTORY:  ? ?Faith Orr is here for continued evaluation and management of her multiple myeloma. ?Her last clinic visit with [name] was on 10/23/2021. ? ?She unfortunately had to go to the emergency room 12/11/2021 for influenza A infection and multilobar pneumonia.  She had 2 weeks of cough and congestion prior to this.  She was evaluated in the emergency room and sent home on Levaquin. ? ?Patient notes she has completed her antibiotics and her respiratory symptoms have significantly improved and nearly resolved but she is still feeling quite fatigued. ?Notes decreased p.o. intake. ?Currently no fevers or other focal symptoms of infection. ? ?She reports increased cognitive function. She describes while working on her taxes and "putting things in order" that she has good memory, but feels she is struggling with sequencing patterns. She expresses she has always had difficulty with her copier, but finds organizing the papers to be particularly challenging. She says that she has experienced some memory loss in the past. We discussed a potential referral to a neurologist which the patient has declined at this time. ? ?She reports pain in her right shoulder and she attributes the potential  cause to laying on it.  ? ?She expresses some recent insomnia and specifies that she did not sleep 02/24/2022, but was able to sleep the following day. She reports that se is sleeping less during the day. ? ?No other new or acute symptoms. ? ?Labs today showed CBC with a hemoglobin of 11.5, mild leukopenia with a WBC count of 3.8k with ANC of 1700 and platelets of 138k ?CMP showed normal creatinine of 1.15 blood sugar of 213k. ? ?She restarted Carlfizomib two weeks after her last visit 01/08/2022. We discussed reducing her dose of  ?Carlfizomib to $RemoveBeforeD'36mg'lSSCFdcKaXtbRe$ /m2. ? ?MEDICAL HISTORY:  ?Past Medical History:  ?Diagnosis Date  ? Allergy   ? seasonal  ? Asthma   ? DEPRESSION   ? DIABETES MELLITUS, TYPE II   ? Diverticulosis   ? HYPERLIPIDEMIA   ? Macular degeneration of left eye   ? mild, Dr.Hecker  ? Obesity, unspecified   ? Osteoarthritis of both knees   ? OSTEOPENIA   ? Osteopenia   ? URINARY INCONTINENCE   ? ? ?SURGICAL HISTORY: ?Past Surgical History:  ?Procedure Laterality Date  ? CATARACT EXTRACTION Left 05/24/2018  ? CESAREAN SECTION  01/1973  ? CYSTOSCOPY/URETEROSCOPY/HOLMIUM LASER/STENT PLACEMENT Right 09/20/2019  ? Procedure: CYSTOSCOPY/URETEROSCOPY/HOLMIUM LASER/STENT PLACEMENT;  Surgeon: Lucas Mallow, MD;  Location: [facility];  Service: Urology;  Laterality: Right;  ? FRACTURE SURGERY    ? IR IMAGING GUIDED PORT INSERTION  02/20/2019  ? left wrist surgery  2008  ? By Dr. Latanya Maudlin  ? right ankle  1994  ? ? ?SOCIAL HISTORY: ?Social History  ? ?Socioeconomic History  ? Marital status: Married  ?  Spouse name: Not on file  ? Number of children: 1  ?  Years of education: Not on file  ? Highest education level: Not on file  ?Occupational History  ?  Employer: [employer]  ?Tobacco Use  ? Smoking status: Never  ? Smokeless tobacco: Never  ? Tobacco comments:  ?  Lives with partner [name]) and son  ?Vaping Use  ? Vaping Use: Never used  ?Substance and Sexual Activity  ? Alcohol use: No   ?  Alcohol/week: 0.0 standard drinks  ? Drug use: No  ? Sexual activity: Never  ?  Partners: Female  ?  Birth control/protection: Post-menopausal  ?  Comment: Lives with female partner (annette hicks) and 53 yo son  ?Other Topics Concern  ? Not on file  ?Social History Narrative  ? Not on file  ? ?Social Determinants of Health  ? ?Financial Resource Strain: Not on file  ?Food Insecurity: Not on file  ?Transportation Needs: Not on file  ?Physical Activity: Not on file  ?Stress: Not on file  ?Social Connections: Not on file  ?Intimate Partner Violence: Not At Risk  ? Fear of Current or Ex-Partner: No  ? Emotionally Abused: No  ? Physically Abused: No  ? Sexually Abused: No  ? ? ?FAMILY HISTORY: ?Family History  ?Problem Relation Age of Onset  ? Diabetes Father   ? Hyperlipidemia Father   ? Heart disease Father   ? Cancer Father   ? Hypertension Father   ? Colon cancer Paternal Grandmother 73  ? Osteoporosis Mother   ? Protein S deficiency Mother   ? Hyperlipidemia Mother   ? Multiple sclerosis Daughter   ? Cancer Other   ?     bladder  ? Breast cancer Neg Hx   ? ? ?ALLERGIES:  is allergic to penicillins, aleve [naproxen sodium], and sulfonamide derivatives. ? ?MEDICATIONS:  ?Current Outpatient Medications  ?Medication Sig Dispense Refill  ? acyclovir (ZOVIRAX) 400 MG tablet TAKE 1 TABLET(400 MG) BY MOUTH TWICE DAILY 60 tablet 5  ? Blood Glucose Monitoring Suppl (FREESTYLE FREEDOM LITE) W/DEVICE KIT Use to check blood sugars twice a day ?Dx 250.00 1 each 0  ? Calcium Carbonate-Vitamin D 600-400 MG-UNIT tablet Take 1 tablet by mouth 2 (two) times daily.    ? dexamethasone (DECADRON) 4 MG tablet Take 5 tablets (20 mg total) by mouth once a week. On D22 of each cycle of treatment 20 tablet 5  ? ELIQUIS 2.5 MG TABS tablet TAKE 1 TABLET(2.5 MG) BY MOUTH TWICE DAILY 60 tablet 2  ? fentaNYL (DURAGESIC) 12 MCG/HR Place 1 patch onto the skin every 3 (three) days. 10 patch 0  ? fluticasone (FLONASE) 50 MCG/ACT nasal spray Place  1 spray into both nostrils daily. 16 g 2  ? glucose blood (FREESTYLE LITE) test strip CHECK BLOOD SUGAR TWICE DAILY AS DIRECTED ?Dx 250.00 180 each 3  ? Lancets (FREESTYLE) lancets Use twice daily to check sugars. 100 each 11  ? lidocaine-prilocaine (EMLA) cream APPLY 1 APPLICATION TO THE AFFECTED AREA AS NEEDED. USE PRIOR TO PORT ACCESS 30 g 0  ? metFORMIN (GLUCOPHAGE-XR) 500 MG 24 hr tablet TAKE 3 TABLETS(1500 MG) BY MOUTH DAILY WITH BREAKFAST (Patient taking differently: Take 1 tablet in AM and 1 Tablet in PM) 90 tablet 1  ? Multiple Vitamins-Minerals (ICAPS) CAPS Take 1 capsule by mouth daily after breakfast.    ? ondansetron (ZOFRAN) 8 MG tablet Take 1 tablet (8 mg total) by mouth 2 (two) times daily as needed (Nausea or vomiting). 30 tablet 1  ? Oxycodone HCl  10 MG TABS Take 1 tablet (10 mg total) by mouth every 6 (six) hours as needed. 90 tablet 0  ? polyethylene glycol (MIRALAX / GLYCOLAX) packet Take 17 g by mouth daily after breakfast.    ? potassium chloride SA (KLOR-CON M) 20 MEQ tablet TAKE 2 TABLETS BY MOUTH TWICE DAILY 360 tablet 1  ? prochlorperazine (COMPAZINE) 10 MG tablet Take 1 tablet (10 mg total) by mouth every 6 (six) hours as needed (Nausea or vomiting). 30 tablet 1  ? REVLIMID 5 MG capsule TAKE 1 CAPSULE BY MOUTH DAILY  FOR 21 DAYS, THEN 7 DAYS OFF 21 capsule 0  ? senna-docusate (SENNA S) 8.6-50 MG tablet Take 2 tablets by mouth at bedtime. 60 tablet 2  ? sertraline (ZOLOFT) 100 MG tablet Take 1 tablet (100 mg total) by mouth daily. 30 tablet 3  ? simvastatin (ZOCOR) 20 MG tablet TAKE 1 TABLET BY MOUTH EVERY DAY AT 6 PM 90 tablet 3  ? Vitamin D, Ergocalciferol, (DRISDOL) 1.25 MG (50000 UNIT) CAPS capsule Take 1 capsule (50,000 Units total) by mouth every 7 (seven) days. 12 capsule 0  ? Cetirizine HCl 10 MG CAPS Take 1 capsule (10 mg total) by mouth daily. (Patient not taking: Reported on 02/26/2022) 30 capsule 1  ? clindamycin (CLEOCIN) 300 MG capsule Take 2 capsules (600 mg total) by mouth  once as needed for up to 1 dose (1 hour prior to dental procedures). (Patient not taking: Reported on 01/08/2022) 2 capsule 0  ? meclizine (ANTIVERT) 25 MG tablet Take 1 tablet (25 mg total) by mouth 3

## 2022-02-26 NOTE — Patient Instructions (Signed)
[facility]  Discharge Instructions: ?Thank you for choosing [facility] to provide your oncology and hematology care.  ? ?If you have a lab appointment with the [facility], please go directly to the [facility] and check in at the registration area. ?  ?Wear comfortable clothing and clothing appropriate for easy access to any Portacath or PICC line.  ? ?We strive to give you quality time with your provider. You may need to reschedule your appointment if you arrive late (15 or more minutes).  Arriving late affects you and other patients whose appointments are after yours.  Also, if you miss three or more appointments without notifying the office, you may be dismissed from the clinic at the provider?s discretion.    ?  ?For prescription refill requests, have your pharmacy contact our office and allow 72 hours for refills to be completed.   ? ?Today you received the following chemotherapy and/or immunotherapy agents: Kyprolis  ?  ?To help prevent nausea and vomiting after your treatment, we encourage you to take your nausea medication as directed. ? ?BELOW ARE SYMPTOMS THAT SHOULD BE REPORTED IMMEDIATELY: ?*FEVER GREATER THAN 100.4 F (38 ?C) OR HIGHER ?*CHILLS OR SWEATING ?*NAUSEA AND VOMITING THAT IS NOT CONTROLLED WITH YOUR NAUSEA MEDICATION ?*UNUSUAL SHORTNESS OF BREATH ?*UNUSUAL BRUISING OR BLEEDING ?*URINARY PROBLEMS (pain or burning when urinating, or frequent urination) ?*BOWEL PROBLEMS (unusual diarrhea, constipation, pain near the anus) ?TENDERNESS IN MOUTH AND THROAT WITH OR WITHOUT PRESENCE OF ULCERS (sore throat, sores in mouth, or a toothache) ?UNUSUAL RASH, SWELLING OR PAIN  ?UNUSUAL VAGINAL DISCHARGE OR ITCHING  ? ?Items with * indicate a potential emergency and should be followed up as soon as possible or go to the Emergency Department if any problems should occur. ? ?Please show the CHEMOTHERAPY ALERT CARD or IMMUNOTHERAPY ALERT CARD at check-in to the  Emergency Department and triage nurse. ? ?Should you have questions after your visit or need to cancel or reschedule your appointment, please contact [facility]  Dept: [phone]  and follow the prompts.  Office hours are 8:00 a.m. to 4:30 p.m. Monday - Friday. Please note that voicemails left after 4:00 p.m. may not be returned until the following business day.  We are closed weekends and major holidays. You have access to a nurse at all times for urgent questions. Please call the main number to the clinic Dept: [phone] and follow the prompts. ? ? ?For any non-urgent questions, you may also contact your provider using MyChart. We now offer e-Visits for anyone 51 and older to request care online for non-urgent symptoms. For details visit [email or web address]. ?  ?Also download the MyChart app! Go to the app store, search "MyChart", open the app, select [facility], and log in with your MyChart username and password. ? ?Due to Covid, a mask is required upon entering the hospital/clinic. If you do not have a mask, one will be given to you upon arrival. For doctor visits, patients may have 1 support person aged 66 or older with them. For treatment visits, patients cannot have anyone with them due to current Covid guidelines and our immunocompromised population.  ? ?Zoledronic Acid Injection (Hypercalcemia, Oncology) ?What is this medication? ?ZOLEDRONIC ACID (ZOE le dron ik AS id) slows calcium loss from bones. It high calcium levels in the blood from some kinds of cancer. It may be used in other people at risk for bone loss. ?This medicine may be used  for other purposes; ask your health care provider or pharmacist if you have questions. ?COMMON BRAND NAME(S): Zometa ?What should I tell my care team before I take this medication? ?They need to know if you have any of these conditions: ?cancer ?dehydration ?dental disease ?kidney disease ?liver disease ?low levels of calcium in  the blood ?lung or breathing disease (asthma) ?receiving steroids like dexamethasone or prednisone ?an unusual or allergic reaction to zoledronic acid, other medicines, foods, dyes, or preservatives ?pregnant or trying to get pregnant ?breast-feeding ?How should I use this medication? ?This drug is injected into a vein. It is given by a health care provider in a hospital or clinic setting. ?Talk to your health care provider about the use of this drug in children. Special care may be needed. ?Overdosage: If you think you have taken too much of this medicine contact a poison control center or emergency room at once. ?NOTE: This medicine is only for you. Do not share this medicine with others. ?What if I miss a dose? ?Keep appointments for follow-up doses. It is important not to miss your dose. Call your health care provider if you are unable to keep an appointment. ?What may interact with this medication? ?certain antibiotics given by injection ?NSAIDs, medicines for pain and inflammation, like ibuprofen or naproxen ?some diuretics like bumetanide, furosemide ?teriparatide ?thalidomide ?This list may not describe all possible interactions. Give your health care provider a list of all the medicines, herbs, non-prescription drugs, or dietary supplements you use. Also tell them if you smoke, drink alcohol, or use illegal drugs. Some items may interact with your medicine. ?What should I watch for while using this medication? ?Visit your health care provider for regular checks on your progress. It may be some time before you see the benefit from this drug. ?Some people who take this drug have severe bone, joint, or muscle pain. This drug may also increase your risk for jaw problems or a broken thigh bone. Tell your health care provider right away if you have severe pain in your jaw, bones, joints, or muscles. Tell you health care provider if you have any pain that does not go away or that gets worse. ?Tell your dentist and  dental surgeon that you are taking this drug. You should not have major dental surgery while on this drug. See your dentist to have a dental exam and fix any dental problems before starting this drug. Take good care of your teeth while on this drug. Make sure you see your dentist for regular follow-up appointments. ?You should make sure you get enough calcium and vitamin D while you are taking this drug. Discuss the foods you eat and the vitamins you take with your health care provider. ?Check with your health care provider if you have severe diarrhea, nausea, and vomiting, or if you sweat a lot. The loss of too much body fluid may make it dangerous for you to take this drug. ?You may need blood work done while you are taking this drug. ?Do not become pregnant while taking this drug. Women should inform their health care provider if they wish to become pregnant or think they might be pregnant. There is potential for serious harm to an unborn child. Talk to your health care provider for more information. ?What side effects may I notice from receiving this medication? ?Side effects that you should report to your doctor or health care provider as soon as possible: ?allergic reactions (skin rash, itching or hives; swelling of  the face, lips, or tongue) ?bone pain ?infection (fever, chills, cough, sore throat, pain or trouble passing urine) ?jaw pain, especially after dental work ?joint pain ?kidney injury (trouble passing urine or change in the amount of urine) ?low blood pressure (dizziness; feeling faint or lightheaded, falls; unusually weak or tired) ?low calcium levels (fast heartbeat; muscle cramps or pain; pain, tingling, or numbness in the hands or feet; seizures) ?low magnesium levels (fast, irregular heartbeat; muscle cramp or pain; muscle weakness; tremors; seizures) ?low red blood cell counts (trouble breathing; feeling faint; lightheaded, falls; unusually weak or tired) ?muscle pain ?redness, blistering,  peeling, or loosening of the skin, including inside the mouth ?severe diarrhea ?swelling of the ankles, feet, hands ?trouble breathing ?Side effects that usually do not require medical attention (report

## 2022-02-27 ENCOUNTER — Ambulatory Visit (INDEPENDENT_AMBULATORY_CARE_PROVIDER_SITE_OTHER): Payer: Medicare PPO

## 2022-02-27 DIAGNOSIS — Z0000 Encounter for general adult medical examination without abnormal findings: Secondary | ICD-10-CM | POA: Diagnosis not present

## 2022-02-27 NOTE — Progress Notes (Signed)
?I connected with Faith Orr today by telephone and verified that I am speaking with the correct person using two identifiers. ?Location patient: home ?Location provider: work ?Persons participating in the virtual visit: patient, provider. ?  ?I discussed the limitations, risks, security and privacy concerns of performing an evaluation and management service by telephone and the availability of in person appointments. I also discussed with the patient that there may be a patient responsible charge related to this service. The patient expressed understanding and verbally consented to this telephonic visit.  ?  ?Interactive audio and video telecommunications were attempted between this provider and patient, however failed, due to patient having technical difficulties OR patient did not have access to video capability.  We continued and completed visit with audio only. ? ?Some vital signs may be absent or patient reported.  ? ?Time Spent with patient on telephone encounter: 40 minutes ? ?Subjective:  ? Faith Orr is a 81 y.o. female who presents for Medicare Annual (Subsequent) preventive examination. ? ?Review of Systems    ? ?Cardiac Risk Factors include: advanced age (>33mn, >>25women);diabetes mellitus;family history of premature cardiovascular disease ? ?   ?Objective:  ?  ?Today's Vitals  ? 02/27/22 1509  ?PainSc: 0-No pain  ? ?There is no height or weight on file to calculate BMI. ? ?Advanced Directives 02/27/2022 12/11/2021 11/20/2021 11/13/2021 10/20/2021 10/16/2021 07/31/2021  ?Does Patient Have a Medical Advance Directive? Yes No Yes No Yes Yes Yes  ?Type of Advance Directive Living will;Healthcare Power of [place] will [place]  ?Does patient want to make changes to medical advance directive? No - Patient declined - No - Patient declined No - Patient declined No - Patient declined No -  Patient declined -  ?Copy of [place] Chart? No - copy requested - - - Yes - validated most recent copy scanned in chart (See row information) No - copy requested No - copy requested  ?Would patient like information on creating a medical advance directive? - No - Guardian declined - - - - No - Patient declined  ? ? ?Current Medications (verified) ?Outpatient Encounter Medications as of 02/27/2022  ?Medication Sig  ? acyclovir (ZOVIRAX) 400 MG tablet TAKE 1 TABLET(400 MG) BY MOUTH TWICE DAILY  ? Blood Glucose Monitoring Suppl (FREESTYLE FREEDOM LITE) W/DEVICE KIT Use to check blood sugars twice a day ?Dx 250.00  ? Calcium Carbonate-Vitamin D 600-400 MG-UNIT tablet Take 1 tablet by mouth 2 (two) times daily.  ? Cetirizine HCl 10 MG CAPS Take 1 capsule (10 mg total) by mouth daily. (Patient not taking: Reported on 02/26/2022)  ? clindamycin (CLEOCIN) 300 MG capsule Take 2 capsules (600 mg total) by mouth once as needed for up to 1 dose (1 hour prior to dental procedures). (Patient not taking: Reported on 01/08/2022)  ? dexamethasone (DECADRON) 4 MG tablet Take 5 tablets (20 mg total) by mouth once a week. On D22 of each cycle of treatment  ? ELIQUIS 2.5 MG TABS tablet TAKE 1 TABLET(2.5 MG) BY MOUTH TWICE DAILY  ? fentaNYL (DURAGESIC) 12 MCG/HR Place 1 patch onto the skin every 3 (three) days.  ? fluticasone (FLONASE) 50 MCG/ACT nasal spray Place 1 spray into both nostrils daily.  ? glucose blood (FREESTYLE LITE) test strip CHECK BLOOD SUGAR TWICE DAILY AS DIRECTED ?Dx 250.00  ? Lancets (FREESTYLE) lancets Use twice daily to check sugars.  ?  lidocaine-prilocaine (EMLA) cream APPLY 1 APPLICATION TO THE AFFECTED AREA AS NEEDED. USE PRIOR TO PORT ACCESS  ? meclizine (ANTIVERT) 25 MG tablet Take 1 tablet (25 mg total) by mouth 3 (three) times daily as needed for dizziness (vertigo). (Patient not taking: Reported on 02/26/2022)  ? metFORMIN (GLUCOPHAGE-XR) 500 MG 24 hr tablet TAKE 3 TABLETS(1500 MG) BY  MOUTH DAILY WITH BREAKFAST (Patient taking differently: Take 1 tablet in AM and 1 Tablet in PM)  ? Multiple Vitamins-Minerals (ICAPS) CAPS Take 1 capsule by mouth daily after breakfast.  ? ondansetron (ZOFRAN) 8 MG tablet Take 1 tablet (8 mg total) by mouth 2 (two) times daily as needed (Nausea or vomiting).  ? Oxycodone HCl 10 MG TABS Take 1 tablet (10 mg total) by mouth every 6 (six) hours as needed.  ? pantoprazole (PROTONIX) 20 MG tablet TAKE 1 TABLET(20 MG) BY MOUTH DAILY (Patient not taking: Reported on 02/26/2022)  ? polyethylene glycol (MIRALAX / GLYCOLAX) packet Take 17 g by mouth daily after breakfast.  ? potassium chloride SA (KLOR-CON M) 20 MEQ tablet TAKE 2 TABLETS BY MOUTH TWICE DAILY  ? prochlorperazine (COMPAZINE) 10 MG tablet Take 1 tablet (10 mg total) by mouth every 6 (six) hours as needed (Nausea or vomiting).  ? REVLIMID 5 MG capsule TAKE 1 CAPSULE BY MOUTH DAILY  FOR 21 DAYS, THEN 7 DAYS OFF  ? senna-docusate (SENNA S) 8.6-50 MG tablet Take 2 tablets by mouth at bedtime.  ? sertraline (ZOLOFT) 100 MG tablet Take 1 tablet (100 mg total) by mouth daily.  ? simvastatin (ZOCOR) 20 MG tablet TAKE 1 TABLET BY MOUTH EVERY DAY AT 6 PM  ? Triamcinolone Acetonide 0.025 % LOTN Apply 1 application topically 3 (three) times daily as needed (rash/itching). (Patient not taking: Reported on 02/26/2022)  ? Vitamin D, Ergocalciferol, (DRISDOL) 1.25 MG (50000 UNIT) CAPS capsule Take 1 capsule (50,000 Units total) by mouth every 7 (seven) days.  ? ?Facility-Administered Encounter Medications as of 02/27/2022  ?Medication  ? heparin lock flush 100 unit/mL  ? sodium chloride flush (NS) 0.9 % injection 10 mL  ? ? ?Allergies (verified) ?Levofloxacin, Penicillins, Aleve [naproxen sodium], and Sulfonamide derivatives  ? ?History: ?Past Medical History:  ?Diagnosis Date  ? Allergy   ? seasonal  ? Asthma   ? DEPRESSION   ? DIABETES MELLITUS, TYPE II   ? Diverticulosis   ? HYPERLIPIDEMIA   ? Macular degeneration of left eye    ? mild, Dr.Hecker  ? Obesity, unspecified   ? Osteoarthritis of both knees   ? OSTEOPENIA   ? Osteopenia   ? URINARY INCONTINENCE   ? ?Past Surgical History:  ?Procedure Laterality Date  ? CATARACT EXTRACTION Left 05/24/2018  ? CESAREAN SECTION  01/1973  ? CYSTOSCOPY/URETEROSCOPY/HOLMIUM LASER/STENT PLACEMENT Right 09/20/2019  ? Procedure: CYSTOSCOPY/URETEROSCOPY/HOLMIUM LASER/STENT PLACEMENT;  Surgeon: Lucas Mallow, MD;  Location: [facility];  Service: Urology;  Laterality: Right;  ? FRACTURE SURGERY    ? IR IMAGING GUIDED PORT INSERTION  02/20/2019  ? left wrist surgery  2008  ? By Dr. Latanya Maudlin  ? right ankle  1994  ? ?Family History  ?Problem Relation Age of Onset  ? Diabetes Father   ? Hyperlipidemia Father   ? Heart disease Father   ? Cancer Father   ? Hypertension Father   ? Colon cancer Paternal Grandmother 20  ? Osteoporosis Mother   ? Protein S deficiency Mother   ? Hyperlipidemia Mother   ? Multiple sclerosis Daughter   ?  Cancer Other   ?     bladder  ? Breast cancer Neg Hx   ? ?Social History  ? ?Socioeconomic History  ? Marital status: Married  ?  Spouse name: Not on file  ? Number of children: 1  ? Years of education: Not on file  ? Highest education level: Not on file  ?Occupational History  ?  Employer: [employer]  ?Tobacco Use  ? Smoking status: Never  ? Smokeless tobacco: Never  ? Tobacco comments:  ?  Lives with partner [name]) and son  ?Vaping Use  ? Vaping Use: Never used  ?Substance and Sexual Activity  ? Alcohol use: No  ?  Alcohol/week: 0.0 standard drinks  ? Drug use: No  ? Sexual activity: Never  ?  Partners: Female  ?  Birth control/protection: Post-menopausal  ?  Comment: Lives with female partner (annette hicks) and 15 yo son  ?Other Topics Concern  ? Not on file  ?Social History Narrative  ? Not on file  ? ?Social Determinants of Health  ? ?Financial Resource Strain: Low Risk   ? Difficulty of Paying Living Expenses: Not hard at all  ?Food  Insecurity: No Food Insecurity  ? Worried About Charity fundraiser in the Last Year: Never true  ? Ran Out of Food in the Last Year: Never true  ?Transportation Needs: No Transportation Needs  ? Lack of Transportatio

## 2022-02-27 NOTE — Patient Instructions (Signed)
Ms. Faith Orr , ?Thank you for taking time to come for your Medicare Wellness Visit. I appreciate your ongoing commitment to your health goals. Please review the following plan we discussed and let me know if I can assist you in the future.  ? ?Screening recommendations/referrals: ?Colonoscopy: 10/21/2017; due every 3-5 years ?Mammogram: 10/25/2019; due every year ?Bone Density: 06/03/2015; normal results ?Recommended yearly ophthalmology/optometry visit for glaucoma screening and checkup ?Recommended yearly dental visit for hygiene and checkup ? ?Vaccinations: ?Influenza vaccine: 09/18/2021 ?Pneumococcal vaccine: 07/18/2008, 12/03/2014 ?Tdap vaccine: 04/18/2010; due every 10 years (overdue) ?Shingles vaccine: no record   ?Covid-19: 02/09/2020, 03/05/2020, 08/28/2020 ? ?Advanced directives: Please bring a copy of your health care power of attorney and living will to the office at your convenience. ? ?Conditions/risks identified: Yes; Reviewed health maintenance screenings with patient today and relevant education, vaccines, and/or referrals were provided. Please continue to do your personal lifestyle choices by: daily care of teeth and gums, regular physical activity (goal should be 5 days a week for 30 minutes), eat a healthy diet, avoid tobacco and drug use, limiting any alcohol intake, taking a low-dose aspirin (if not allergic or have been advised by your provider otherwise) and taking vitamins and minerals as recommended by your provider. Continue doing brain stimulating activities (puzzles, reading, adult coloring books, staying active) to keep memory sharp. Continue to eat heart healthy diet (full of fruits, vegetables, whole grains, lean protein, water--limit salt, fat, and sugar intake) and increase physical activity as tolerated. ? ?Next appointment: Please schedule your next Medicare Wellness Visit with your Nurse Health Advisor in 1 year by calling [phone]. ? ? ?Preventive Care 79 Years and Older,  Female ?Preventive care refers to lifestyle choices and visits with your health care provider that can promote health and wellness. ?What does preventive care include? ?A yearly physical exam. This is also called an annual well check. ?Dental exams once or twice a year. ?Routine eye exams. Ask your health care provider how often you should have your eyes checked. ?Personal lifestyle choices, including: ?Daily care of your teeth and gums. ?Regular physical activity. ?Eating a healthy diet. ?Avoiding tobacco and drug use. ?Limiting alcohol use. ?Practicing safe sex. ?Taking low-dose aspirin every day. ?Taking vitamin and mineral supplements as recommended by your health care provider. ?What happens during an annual well check? ?The services and screenings done by your health care provider during your annual well check will depend on your age, overall health, lifestyle risk factors, and family history of disease. ?Counseling  ?Your health care provider may ask you questions about your: ?Alcohol use. ?Tobacco use. ?Drug use. ?Emotional well-being. ?Home and relationship well-being. ?Sexual activity. ?Eating habits. ?History of falls. ?Memory and ability to understand (cognition). ?Work and work Statistician. ?Reproductive health. ?Screening  ?You may have the following tests or measurements: ?Height, weight, and BMI. ?Blood pressure. ?Lipid and cholesterol levels. These may be checked every 5 years, or more frequently if you are over 74 years old. ?Skin check. ?Lung cancer screening. You may have this screening every year starting at age 32 if you have a 30-pack-year history of smoking and currently smoke or have quit within the past 15 years. ?Fecal occult blood test (FOBT) of the stool. You may have this test every year starting at age 42. ?Flexible sigmoidoscopy or colonoscopy. You may have a sigmoidoscopy every 5 years or a colonoscopy every 10 years starting at age 69. ?Hepatitis C blood test. ?Hepatitis B blood  test. ?Sexually transmitted disease (STD) testing. ?  Diabetes screening. This is done by checking your blood sugar (glucose) after you have not eaten for a while (fasting). You may have this done every 1-3 years. ?Bone density scan. This is done to screen for osteoporosis. You may have this done starting at age 2. ?Mammogram. This may be done every 1-2 years. Talk to your health care provider about how often you should have regular mammograms. ?Talk with your health care provider about your test results, treatment options, and if necessary, the need for more tests. ?Vaccines  ?Your health care provider may recommend certain vaccines, such as: ?Influenza vaccine. This is recommended every year. ?Tetanus, diphtheria, and acellular pertussis (Tdap, Td) vaccine. You may need a Td booster every 10 years. ?Zoster vaccine. You may need this after age 53. ?Pneumococcal 13-valent conjugate (PCV13) vaccine. One dose is recommended after age 2. ?Pneumococcal polysaccharide (PPSV23) vaccine. One dose is recommended after age 1. ?Talk to your health care provider about which screenings and vaccines you need and how often you need them. ?This information is not intended to replace advice given to you by your health care provider. Make sure you discuss any questions you have with your health care provider. ?Document Released: 12/27/2015 Document Revised: 08/19/2016 Document Reviewed: 10/01/2015 ?Elsevier Interactive Patient Education ? 2017 [place]. ? ?Fall Prevention in the Home ?Falls can cause injuries. They can happen to people of all ages. There are many things you can do to make your home safe and to help prevent falls. ?What can I do on the outside of my home? ?Regularly fix the edges of walkways and driveways and fix any cracks. ?Remove anything that might make you trip as you walk through a door, such as a raised step or threshold. ?Trim any bushes or trees on the path to your home. ?Use bright outdoor  lighting. ?Clear any walking paths of anything that might make someone trip, such as rocks or tools. ?Regularly check to see if handrails are loose or broken. Make sure that both sides of any steps have handrails. ?Any raised decks and porches should have guardrails on the edges. ?Have any leaves, snow, or ice cleared regularly. ?Use sand or salt on walking paths during winter. ?Clean up any spills in your garage right away. This includes oil or grease spills. ?What can I do in the bathroom? ?Use night lights. ?Install grab bars by the toilet and in the tub and shower. Do not use towel bars as grab bars. ?Use non-skid mats or decals in the tub or shower. ?If you need to sit down in the shower, use a plastic, non-slip stool. ?Keep the floor dry. Clean up any water that spills on the floor as soon as it happens. ?Remove soap buildup in the tub or shower regularly. ?Attach bath mats securely with double-sided non-slip rug tape. ?Do not have throw rugs and other things on the floor that can make you trip. ?What can I do in the bedroom? ?Use night lights. ?Make sure that you have a light by your bed that is easy to reach. ?Do not use any sheets or blankets that are too big for your bed. They should not hang down onto the floor. ?Have a firm chair that has side arms. You can use this for support while you get dressed. ?Do not have throw rugs and other things on the floor that can make you trip. ?What can I do in the kitchen? ?Clean up any spills right away. ?Avoid walking on wet floors. ?Keep  items that you use a lot in easy-to-reach places. ?If you need to reach something above you, use a strong step stool that has a grab bar. ?Keep electrical cords out of the way. ?Do not use floor polish or wax that makes floors slippery. If you must use wax, use non-skid floor wax. ?Do not have throw rugs and other things on the floor that can make you trip. ?What can I do with my stairs? ?Do not leave any items on the stairs. ?Make  sure that there are handrails on both sides of the stairs and use them. Fix handrails that are broken or loose. Make sure that handrails are as long as the stairways. ?Check any carpeting to make sure that it is firmly attached to th

## 2022-03-02 ENCOUNTER — Telehealth: Payer: Self-pay | Admitting: Hematology

## 2022-03-02 NOTE — Telephone Encounter (Signed)
error 

## 2022-03-02 NOTE — Telephone Encounter (Signed)
Scheduled follow-up appointments per 3/16 los. Patient is aware. 

## 2022-03-04 ENCOUNTER — Other Ambulatory Visit: Payer: Self-pay | Admitting: Hematology

## 2022-03-04 ENCOUNTER — Encounter: Payer: Self-pay | Admitting: Hematology

## 2022-03-04 MED ORDER — MOLNUPIRAVIR EUA 200MG CAPSULE: 4.0000 | ORAL_CAPSULE | Freq: Two times a day (BID) | ORAL | 0 refills | Status: AC

## 2022-03-04 NOTE — Progress Notes (Signed)
Patient with mild covid 19 symptoms and +ve covid 19 test with mod-high risk of severity disease on account of myeloma on treatment. ?Molnupiravir prescription sent to her pharmacy. ?.Trea Faith Orr  ? ?

## 2022-03-04 NOTE — Progress Notes (Signed)
Pt's significant other called: Pt tested positive for COVID on 03/03/22. Pt 's 03/12/22 appointments canceled per Dr Irene Limbo. Pt to hold Revlimid for at least a week. Pt will call office prior to restarting. Pt and SO verbalized understanding.  ?

## 2022-03-05 ENCOUNTER — Ambulatory Visit: Payer: Medicare PPO

## 2022-03-12 ENCOUNTER — Other Ambulatory Visit: Payer: Medicare PPO

## 2022-03-12 ENCOUNTER — Ambulatory Visit: Payer: Medicare PPO

## 2022-03-12 ENCOUNTER — Encounter: Payer: Self-pay | Admitting: Hematology

## 2022-03-16 ENCOUNTER — Other Ambulatory Visit (HOSPITAL_COMMUNITY): Payer: Self-pay | Admitting: Hematology

## 2022-03-16 DIAGNOSIS — C7951 Secondary malignant neoplasm of bone: Secondary | ICD-10-CM

## 2022-03-16 DIAGNOSIS — C9000 Multiple myeloma not having achieved remission: Secondary | ICD-10-CM

## 2022-03-19 ENCOUNTER — Other Ambulatory Visit: Payer: Medicare PPO

## 2022-03-19 ENCOUNTER — Ambulatory Visit: Payer: Medicare PPO

## 2022-03-20 ENCOUNTER — Other Ambulatory Visit: Payer: Self-pay | Admitting: Hematology

## 2022-03-21 ENCOUNTER — Other Ambulatory Visit: Payer: Self-pay | Admitting: Hematology

## 2022-03-21 DIAGNOSIS — C9000 Multiple myeloma not having achieved remission: Secondary | ICD-10-CM

## 2022-03-26 ENCOUNTER — Inpatient Hospital Stay: Payer: Medicare PPO | Attending: Hematology

## 2022-03-26 ENCOUNTER — Other Ambulatory Visit: Payer: Self-pay | Admitting: Internal Medicine

## 2022-03-26 ENCOUNTER — Inpatient Hospital Stay (HOSPITAL_BASED_OUTPATIENT_CLINIC_OR_DEPARTMENT_OTHER): Payer: Medicare PPO | Admitting: Physician Assistant

## 2022-03-26 ENCOUNTER — Other Ambulatory Visit: Payer: Self-pay

## 2022-03-26 ENCOUNTER — Inpatient Hospital Stay: Payer: Medicare PPO

## 2022-03-26 VITALS — BP 134/81 | HR 71 | Temp 98.5°F | Resp 18 | Wt 144.5 lb

## 2022-03-26 DIAGNOSIS — Z5112 Encounter for antineoplastic immunotherapy: Secondary | ICD-10-CM | POA: Diagnosis not present

## 2022-03-26 DIAGNOSIS — Z95828 Presence of other vascular implants and grafts: Secondary | ICD-10-CM

## 2022-03-26 DIAGNOSIS — C9000 Multiple myeloma not having achieved remission: Secondary | ICD-10-CM

## 2022-03-26 DIAGNOSIS — Z7189 Other specified counseling: Secondary | ICD-10-CM

## 2022-03-26 DIAGNOSIS — Z79899 Other long term (current) drug therapy: Secondary | ICD-10-CM | POA: Diagnosis not present

## 2022-03-26 LAB — CBC WITH DIFFERENTIAL (CANCER CENTER ONLY)
Abs Immature Granulocytes: 0.01 10*3/uL (ref 0.00–0.07)
Basophils Absolute: 0 10*3/uL (ref 0.0–0.1)
Basophils Relative: 0 %
Eosinophils Absolute: 0 10*3/uL (ref 0.0–0.5)
Eosinophils Relative: 1 %
HCT: 34.8 % — ABNORMAL LOW (ref 36.0–46.0)
Hemoglobin: 11.7 g/dL — ABNORMAL LOW (ref 12.0–15.0)
Immature Granulocytes: 0 %
Lymphocytes Relative: 27 %
Lymphs Abs: 1.2 10*3/uL (ref 0.7–4.0)
MCH: 34.3 pg — ABNORMAL HIGH (ref 26.0–34.0)
MCHC: 33.6 g/dL (ref 30.0–36.0)
MCV: 102.1 fL — ABNORMAL HIGH (ref 80.0–100.0)
Monocytes Absolute: 1.1 10*3/uL — ABNORMAL HIGH (ref 0.1–1.0)
Monocytes Relative: 24 %
Neutro Abs: 2.1 10*3/uL (ref 1.7–7.7)
Neutrophils Relative %: 48 %
Platelet Count: 127 10*3/uL — ABNORMAL LOW (ref 150–400)
RBC: 3.41 MIL/uL — ABNORMAL LOW (ref 3.87–5.11)
RDW: 14 % (ref 11.5–15.5)
WBC Count: 4.5 10*3/uL (ref 4.0–10.5)
nRBC: 0 % (ref 0.0–0.2)

## 2022-03-26 LAB — CMP (CANCER CENTER ONLY)
ALT: 12 U/L (ref 0–44)
AST: 13 U/L — ABNORMAL LOW (ref 15–41)
Albumin: 3.9 g/dL (ref 3.5–5.0)
Alkaline Phosphatase: 84 U/L (ref 38–126)
Anion gap: 4 — ABNORMAL LOW (ref 5–15)
BUN: 22 mg/dL (ref 8–23)
CO2: 28 mmol/L (ref 22–32)
Calcium: 10.1 mg/dL (ref 8.9–10.3)
Chloride: 106 mmol/L (ref 98–111)
Creatinine: 0.99 mg/dL (ref 0.44–1.00)
GFR, Estimated: 58 mL/min — ABNORMAL LOW (ref >60)
Glucose, Bld: 173 mg/dL — ABNORMAL HIGH (ref 70–99)
Potassium: 4.4 mmol/L (ref 3.5–5.1)
Sodium: 138 mmol/L (ref 135–145)
Total Bilirubin: 0.4 mg/dL (ref 0.3–1.2)
Total Protein: 6.6 g/dL (ref 6.5–8.1)

## 2022-03-26 MED ORDER — SODIUM CHLORIDE 0.9 % IV SOLN: Freq: Once | INTRAVENOUS | Status: AC

## 2022-03-26 MED ORDER — ZOLEDRONIC ACID 4 MG/100ML IV SOLN
4.0000 mg | Freq: Once | INTRAVENOUS | Status: AC
  Administered 2022-03-26: 4 mg via INTRAVENOUS
  Filled 2022-03-26: qty 100

## 2022-03-26 MED ORDER — DEXAMETHASONE 4 MG PO TABS
12.0000 mg | ORAL_TABLET | Freq: Once | ORAL | Status: AC
  Administered 2022-03-26: 12 mg via ORAL
  Filled 2022-03-26: qty 3

## 2022-03-26 MED ORDER — HEPARIN SOD (PORK) LOCK FLUSH 100 UNIT/ML IV SOLN
500.0000 [IU] | Freq: Once | INTRAVENOUS | Status: AC | PRN
  Administered 2022-03-26: 500 [IU]

## 2022-03-26 MED ORDER — SODIUM CHLORIDE 0.9% FLUSH
10.0000 mL | Freq: Once | INTRAVENOUS | Status: AC
  Administered 2022-03-26: 10 mL

## 2022-03-26 MED ORDER — DEXTROSE 5 % IV SOLN
36.0000 mg/m2 | Freq: Once | INTRAVENOUS | Status: AC
  Administered 2022-03-26: 66 mg via INTRAVENOUS
  Filled 2022-03-26: qty 30

## 2022-03-26 MED ORDER — PROCHLORPERAZINE MALEATE 10 MG PO TABS
10.0000 mg | ORAL_TABLET | Freq: Once | ORAL | Status: AC
  Administered 2022-03-26: 10 mg via ORAL
  Filled 2022-03-26: qty 1

## 2022-03-26 MED ORDER — DIPHENHYDRAMINE HCL 25 MG PO CAPS
25.0000 mg | ORAL_CAPSULE | Freq: Once | ORAL | Status: AC
  Administered 2022-03-26: 25 mg via ORAL
  Filled 2022-03-26: qty 1

## 2022-03-26 MED ORDER — SODIUM CHLORIDE 0.9% FLUSH
10.0000 mL | INTRAVENOUS | Status: DC | PRN
  Administered 2022-03-26: 10 mL

## 2022-03-26 MED ORDER — ACETAMINOPHEN 325 MG PO TABS
650.0000 mg | ORAL_TABLET | Freq: Once | ORAL | Status: AC
  Administered 2022-03-26: 650 mg via ORAL
  Filled 2022-03-26: qty 2

## 2022-03-26 MED ORDER — FAMOTIDINE 20 MG PO TABS
20.0000 mg | ORAL_TABLET | Freq: Once | ORAL | Status: AC
  Administered 2022-03-26: 20 mg via ORAL
  Filled 2022-03-26: qty 1

## 2022-03-26 NOTE — Patient Instructions (Signed)
[facility]   Discharge Instructions: Thank you for choosing [facility] to provide your oncology and hematology care.   If you have a lab appointment with the Cancer Center, please go directly to the Cancer Center and check in at the registration area.   Wear comfortable clothing and clothing appropriate for easy access to any Portacath or PICC line.   We strive to give you quality time with your provider. You may need to reschedule your appointment if you arrive late (15 or more minutes).  Arriving late affects you and other patients whose appointments are after yours.  Also, if you miss three or more appointments without notifying the office, you may be dismissed from the clinic at the provider's discretion.      For prescription refill requests, have your pharmacy contact our office and allow 72 hours for refills to be completed.    Today you received the following chemotherapy and/or immunotherapy agents: Carfilzomib (Kyprolis)     To help prevent nausea and vomiting after your treatment, we encourage you to take your nausea medication as directed.  BELOW ARE SYMPTOMS THAT SHOULD BE REPORTED IMMEDIATELY: *FEVER GREATER THAN 100.4 F (38 C) OR HIGHER *CHILLS OR SWEATING *NAUSEA AND VOMITING THAT IS NOT CONTROLLED WITH YOUR NAUSEA MEDICATION *UNUSUAL SHORTNESS OF BREATH *UNUSUAL BRUISING OR BLEEDING *URINARY PROBLEMS (pain or burning when urinating, or frequent urination) *BOWEL PROBLEMS (unusual diarrhea, constipation, pain near the anus) TENDERNESS IN MOUTH AND THROAT WITH OR WITHOUT PRESENCE OF ULCERS (sore throat, sores in mouth, or a toothache) UNUSUAL RASH, SWELLING OR PAIN  UNUSUAL VAGINAL DISCHARGE OR ITCHING   Items with * indicate a potential emergency and should be followed up as soon as possible or go to the Emergency Department if any problems should occur.  Please show the CHEMOTHERAPY ALERT CARD or IMMUNOTHERAPY ALERT CARD  at check-in to the Emergency Department and triage nurse.  Should you have questions after your visit or need to cancel or reschedule your appointment, please contact [facility]  Dept: [phone]  and follow the prompts.  Office hours are 8:00 a.m. to 4:30 p.m. Monday - Friday. Please note that voicemails left after 4:00 p.m. may not be returned until the following business day.  We are closed weekends and major holidays. You have access to a nurse at all times for urgent questions. Please call the main number to the clinic Dept: [phone] and follow the prompts.   For any non-urgent questions, you may also contact your provider using MyChart. We now offer e-Visits for anyone 18 and older to request care online for non-urgent symptoms. For details visit [email or web address].   Also download the MyChart app! Go to the app store, search "MyChart", open the app, select [facility], and log in with your MyChart username and password.  Due to Covid, a mask is required upon entering the hospital/clinic. If you do not have a mask, one will be given to you upon arrival. For doctor visits, patients may have 1 support person aged 18 or older with them. For treatment visits, patients cannot have anyone with them due to current Covid guidelines and our immunocompromised population.  

## 2022-03-26 NOTE — Progress Notes (Signed)
Continue Kyprolis '66mg'$ . Will monitor weight and adjust dose as needed.  ? ?Acquanetta Belling, [place], BCPS, BCOP ?03/26/2022 ?2:17 PM ? ?

## 2022-03-27 ENCOUNTER — Other Ambulatory Visit: Payer: Self-pay

## 2022-03-27 DIAGNOSIS — C9000 Multiple myeloma not having achieved remission: Secondary | ICD-10-CM

## 2022-03-27 MED ORDER — LENALIDOMIDE 5 MG PO CAPS: ORAL_CAPSULE | ORAL | 0 refills | Status: DC

## 2022-03-30 ENCOUNTER — Encounter: Payer: Self-pay | Admitting: Hematology

## 2022-03-30 NOTE — Progress Notes (Signed)
? ? ?HEMATOLOGY/ONCOLOGY CLINIC NOTE ? ?Date of Service: 03/26/2022 ? ?Patient Care Team: ?Hoyt Koch, MD as PCP - General (Internal Medicine) ?Lafayette Dragon, MD (Inactive) as Consulting Physician (Gastroenterology) ?Megan Salon, MD as Consulting Physician (Gynecology) ?Melrose Nakayama, MD as Consulting Physician (Orthopedic Surgery) ?Deneise Lever, MD as Consulting Physician (Pulmonary Disease) ?[name], MD (Ophthalmology) ? ?CHIEF COMPLAINTS/PURPOSE OF CONSULTATION:  ?Follow-up for continued evaluation and management of multiple myeloma ? ?HISTORY OF PRESENTING ILLNESS:  ?Please see previous notes for details on initial presentation ? ?Current Treatment: ?Carfilzomib + Revlimid maintenance. ? ?INTERVAL HISTORY:  ?Faith Orr is here for continued evaluation and management of her multiple myeloma.  She was last seen by Dr. Rod Can on 01/08/2022.  In the interim, patient tested positive for COVID on 03/03/2022 so Revlimid was held and Carfilzomib infusion was delayed until today.  ? ?At today's visit, Faith Orr reports that her energy levels are back to baseline.  She is able to complete her daily routines on her own.  She reports her appetite is stable without any weight loss.  She denies any nausea, vomiting or abdominal pain.  Her bowel habits are unchanged without any recurrent episodes of diarrhea or constipation.  She denies easy bruising or signs of active bleeding.  She denies fevers, chills, night sweats, shortness of breath, chest pain or cough.  He has no other complaints. ? ?MEDICAL HISTORY:  ?Past Medical History:  ?Diagnosis Date  ? Allergy   ? seasonal  ? Asthma   ? DEPRESSION   ? DIABETES MELLITUS, TYPE II   ? Diverticulosis   ? HYPERLIPIDEMIA   ? Macular degeneration of left eye   ? mild, Dr.Hecker  ? Obesity, unspecified   ? Osteoarthritis of both knees   ? OSTEOPENIA   ? Osteopenia   ? URINARY INCONTINENCE   ? ? ?SURGICAL HISTORY: ?Past Surgical History:  ?Procedure  Laterality Date  ? CATARACT EXTRACTION Left 05/24/2018  ? CESAREAN SECTION  01/1973  ? CYSTOSCOPY/URETEROSCOPY/HOLMIUM LASER/STENT PLACEMENT Right 09/20/2019  ? Procedure: CYSTOSCOPY/URETEROSCOPY/HOLMIUM LASER/STENT PLACEMENT;  Surgeon: Lucas Mallow, MD;  Location: [facility];  Service: Urology;  Laterality: Right;  ? FRACTURE SURGERY    ? IR IMAGING GUIDED PORT INSERTION  02/20/2019  ? left wrist surgery  2008  ? By Dr. Latanya Maudlin  ? right ankle  1994  ? ? ?SOCIAL HISTORY: ?Social History  ? ?Socioeconomic History  ? Marital status: Married  ?  Spouse name: Not on file  ? Number of children: 1  ? Years of education: Not on file  ? Highest education level: Not on file  ?Occupational History  ?  Employer: [employer]  ?Tobacco Use  ? Smoking status: Never  ? Smokeless tobacco: Never  ? Tobacco comments:  ?  Lives with partner [name]) and son  ?Vaping Use  ? Vaping Use: Never used  ?Substance and Sexual Activity  ? Alcohol use: No  ?  Alcohol/week: 0.0 standard drinks  ? Drug use: No  ? Sexual activity: Never  ?  Partners: Female  ?  Birth control/protection: Post-menopausal  ?  Comment: Lives with female partner (annette hicks) and 37 yo son  ?Other Topics Concern  ? Not on file  ?Social History Narrative  ? Not on file  ? ?Social Determinants of Health  ? ?Financial Resource Strain: Low Risk   ? Difficulty of Paying Living Expenses: Not hard at all  ?Food Insecurity: No Food  Insecurity  ? Worried About Charity fundraiser in the Last Year: Never true  ? Ran Out of Food in the Last Year: Never true  ?Transportation Needs: No Transportation Needs  ? Lack of Transportation (Medical): No  ? Lack of Transportation (Non-Medical): No  ?Physical Activity: Unknown  ? Days of Exercise per Week: Patient refused  ? Minutes of Exercise per Session: Patient refused  ?Stress: No Stress Concern Present  ? Feeling of Stress : Not at all  ?Social Connections: Unknown  ? Frequency of Communication  with Friends and Family: Patient refused  ? Frequency of Social Gatherings with Friends and Family: Patient refused  ? Attends Religious Services: Patient refused  ? Active Member of Clubs or Organizations: Patient refused  ? Attends Archivist Meetings: Patient refused  ? Marital Status: Living with partner  ?Intimate Partner Violence: Not At Risk  ? Fear of Current or Ex-Partner: No  ? Emotionally Abused: No  ? Physically Abused: No  ? Sexually Abused: No  ? ? ?FAMILY HISTORY: ?Family History  ?Problem Relation Age of Onset  ? Diabetes Father   ? Hyperlipidemia Father   ? Heart disease Father   ? Cancer Father   ? Hypertension Father   ? Colon cancer Paternal Grandmother 76  ? Osteoporosis Mother   ? Protein S deficiency Mother   ? Hyperlipidemia Mother   ? Multiple sclerosis Daughter   ? Cancer Other   ?     bladder  ? Breast cancer Neg Hx   ? ? ?ALLERGIES:  is allergic to levofloxacin, penicillins, aleve [naproxen sodium], and sulfonamide derivatives. ? ?MEDICATIONS:  ?Current Outpatient Medications  ?Medication Sig Dispense Refill  ? acyclovir (ZOVIRAX) 400 MG tablet TAKE 1 TABLET(400 MG) BY MOUTH TWICE DAILY 60 tablet 5  ? Blood Glucose Monitoring Suppl (FREESTYLE FREEDOM LITE) W/DEVICE KIT Use to check blood sugars twice a day ?Dx 250.00 1 each 0  ? Calcium Carbonate-Vitamin D 600-400 MG-UNIT tablet Take 1 tablet by mouth 2 (two) times daily.    ? Cetirizine HCl 10 MG CAPS Take 1 capsule (10 mg total) by mouth daily. (Patient not taking: Reported on 02/26/2022) 30 capsule 1  ? clindamycin (CLEOCIN) 300 MG capsule Take 2 capsules (600 mg total) by mouth once as needed for up to 1 dose (1 hour prior to dental procedures). (Patient not taking: Reported on 01/08/2022) 2 capsule 0  ? dexamethasone (DECADRON) 4 MG tablet Take 5 tablets (20 mg total) by mouth once a week. On D22 of each cycle of treatment 20 tablet 5  ? ELIQUIS 2.5 MG TABS tablet TAKE 1 TABLET(2.5 MG) BY MOUTH TWICE DAILY 60 tablet 2  ?  fentaNYL (DURAGESIC) 12 MCG/HR Place 1 patch onto the skin every 3 (three) days. 10 patch 0  ? fluticasone (FLONASE) 50 MCG/ACT nasal spray Place 1 spray into both nostrils daily. 16 g 2  ? glucose blood (FREESTYLE LITE) test strip CHECK BLOOD SUGAR TWICE DAILY AS DIRECTED ?Dx 250.00 180 each 3  ? Lancets (FREESTYLE) lancets Use twice daily to check sugars. 100 each 11  ? lenalidomide (REVLIMID) 5 MG capsule TAKE 1 CAPSULE BY MOUTH DAILY  FOR 21 DAYS, THEN 7 DAYS OFF 21 capsule 0  ? lidocaine-prilocaine (EMLA) cream APPLY 1 APPLICATION TO THE AFFECTED AREA AS NEEDED. USE PRIOR TO PORT ACCESS 30 g 0  ? meclizine (ANTIVERT) 25 MG tablet Take 1 tablet (25 mg total) by mouth 3 (three) times daily as needed for  dizziness (vertigo). (Patient not taking: Reported on 02/26/2022) 30 tablet 0  ? metFORMIN (GLUCOPHAGE-XR) 500 MG 24 hr tablet TAKE 3 TABLETS(1500 MG) BY MOUTH DAILY WITH BREAKFAST (Patient taking differently: Take 1 tablet in AM and 1 Tablet in PM) 90 tablet 1  ? Multiple Vitamins-Minerals (ICAPS) CAPS Take 1 capsule by mouth daily after breakfast.    ? ondansetron (ZOFRAN) 8 MG tablet Take 1 tablet (8 mg total) by mouth 2 (two) times daily as needed (Nausea or vomiting). 30 tablet 1  ? Oxycodone HCl 10 MG TABS Take 1 tablet (10 mg total) by mouth every 6 (six) hours as needed. 90 tablet 0  ? pantoprazole (PROTONIX) 20 MG tablet TAKE 1 TABLET(20 MG) BY MOUTH DAILY (Patient not taking: Reported on 02/26/2022) 30 tablet 5  ? polyethylene glycol (MIRALAX / GLYCOLAX) packet Take 17 g by mouth daily after breakfast.    ? potassium chloride SA (KLOR-CON M) 20 MEQ tablet TAKE 2 TABLETS BY MOUTH TWICE DAILY 360 tablet 1  ? prochlorperazine (COMPAZINE) 10 MG tablet Take 1 tablet (10 mg total) by mouth every 6 (six) hours as needed (Nausea or vomiting). 30 tablet 1  ? senna-docusate (SENNA S) 8.6-50 MG tablet Take 2 tablets by mouth at bedtime. 60 tablet 2  ? sertraline (ZOLOFT) 100 MG tablet TAKE 1 TABLET(100 MG) BY MOUTH  DAILY 30 tablet 3  ? simvastatin (ZOCOR) 20 MG tablet TAKE 1 TABLET BY MOUTH EVERY DAY AT 6 PM 90 tablet 3  ? Triamcinolone Acetonide 0.025 % LOTN Apply 1 application topically 3 (three) times daily as needed (ra

## 2022-04-02 ENCOUNTER — Ambulatory Visit: Payer: Medicare PPO

## 2022-04-03 ENCOUNTER — Other Ambulatory Visit: Payer: Self-pay | Admitting: Hematology

## 2022-04-03 DIAGNOSIS — C9000 Multiple myeloma not having achieved remission: Secondary | ICD-10-CM

## 2022-04-05 ENCOUNTER — Encounter: Payer: Self-pay | Admitting: Hematology

## 2022-04-05 MED ORDER — OXYCODONE HCL 10 MG PO TABS: 10.0000 mg | ORAL_TABLET | Freq: Four times a day (QID) | ORAL | 0 refills | Status: DC | PRN

## 2022-04-07 ENCOUNTER — Other Ambulatory Visit: Payer: Self-pay

## 2022-04-07 DIAGNOSIS — C9000 Multiple myeloma not having achieved remission: Secondary | ICD-10-CM

## 2022-04-09 ENCOUNTER — Other Ambulatory Visit: Payer: Self-pay

## 2022-04-09 ENCOUNTER — Ambulatory Visit: Payer: Medicare PPO

## 2022-04-09 ENCOUNTER — Inpatient Hospital Stay: Payer: Medicare PPO

## 2022-04-09 ENCOUNTER — Other Ambulatory Visit: Payer: Medicare PPO

## 2022-04-09 ENCOUNTER — Inpatient Hospital Stay: Payer: Medicare PPO | Admitting: Hematology

## 2022-04-09 VITALS — BP 123/65 | HR 80 | Temp 98.1°F | Resp 18 | Wt 149.7 lb

## 2022-04-09 DIAGNOSIS — C9000 Multiple myeloma not having achieved remission: Secondary | ICD-10-CM

## 2022-04-09 DIAGNOSIS — Z5111 Encounter for antineoplastic chemotherapy: Secondary | ICD-10-CM | POA: Diagnosis not present

## 2022-04-09 DIAGNOSIS — Z7189 Other specified counseling: Secondary | ICD-10-CM

## 2022-04-09 DIAGNOSIS — Z79899 Other long term (current) drug therapy: Secondary | ICD-10-CM | POA: Diagnosis not present

## 2022-04-09 DIAGNOSIS — Z5112 Encounter for antineoplastic immunotherapy: Secondary | ICD-10-CM | POA: Diagnosis not present

## 2022-04-09 DIAGNOSIS — Z95828 Presence of other vascular implants and grafts: Secondary | ICD-10-CM

## 2022-04-09 LAB — CMP (CANCER CENTER ONLY)
ALT: 14 U/L (ref 0–44)
AST: 14 U/L — ABNORMAL LOW (ref 15–41)
Albumin: 3.9 g/dL (ref 3.5–5.0)
Alkaline Phosphatase: 95 U/L (ref 38–126)
Anion gap: 9 (ref 5–15)
BUN: 17 mg/dL (ref 8–23)
CO2: 23 mmol/L (ref 22–32)
Calcium: 9.7 mg/dL (ref 8.9–10.3)
Chloride: 107 mmol/L (ref 98–111)
Creatinine: 1.17 mg/dL — ABNORMAL HIGH (ref 0.44–1.00)
GFR, Estimated: 48 mL/min — ABNORMAL LOW (ref >60)
Glucose, Bld: 239 mg/dL — ABNORMAL HIGH (ref 70–99)
Potassium: 4.1 mmol/L (ref 3.5–5.1)
Sodium: 139 mmol/L (ref 135–145)
Total Bilirubin: 0.7 mg/dL (ref 0.3–1.2)
Total Protein: 6.4 g/dL — ABNORMAL LOW (ref 6.5–8.1)

## 2022-04-09 LAB — CBC WITH DIFFERENTIAL (CANCER CENTER ONLY)
Abs Immature Granulocytes: 0.03 10*3/uL (ref 0.00–0.07)
Basophils Absolute: 0 10*3/uL (ref 0.0–0.1)
Basophils Relative: 0 %
Eosinophils Absolute: 0 10*3/uL (ref 0.0–0.5)
Eosinophils Relative: 1 %
HCT: 34.7 % — ABNORMAL LOW (ref 36.0–46.0)
Hemoglobin: 11.7 g/dL — ABNORMAL LOW (ref 12.0–15.0)
Immature Granulocytes: 1 %
Lymphocytes Relative: 17 %
Lymphs Abs: 1 10*3/uL (ref 0.7–4.0)
MCH: 34.8 pg — ABNORMAL HIGH (ref 26.0–34.0)
MCHC: 33.7 g/dL (ref 30.0–36.0)
MCV: 103.3 fL — ABNORMAL HIGH (ref 80.0–100.0)
Monocytes Absolute: 0.7 10*3/uL (ref 0.1–1.0)
Monocytes Relative: 12 %
Neutro Abs: 4 10*3/uL (ref 1.7–7.7)
Neutrophils Relative %: 69 %
Platelet Count: 109 10*3/uL — ABNORMAL LOW (ref 150–400)
RBC: 3.36 MIL/uL — ABNORMAL LOW (ref 3.87–5.11)
RDW: 14.1 % (ref 11.5–15.5)
WBC Count: 5.9 10*3/uL (ref 4.0–10.5)
nRBC: 0 % (ref 0.0–0.2)

## 2022-04-09 MED ORDER — SODIUM CHLORIDE 0.9% FLUSH: 10.0000 mL | INTRAVENOUS | Status: DC | PRN

## 2022-04-09 MED ORDER — HEPARIN SOD (PORK) LOCK FLUSH 100 UNIT/ML IV SOLN: 500.0000 [IU] | Freq: Once | INTRAVENOUS | Status: DC | PRN

## 2022-04-09 MED ORDER — SODIUM CHLORIDE 0.9 % IV SOLN: Freq: Once | INTRAVENOUS | Status: AC

## 2022-04-09 MED ORDER — SODIUM CHLORIDE 0.9% FLUSH
10.0000 mL | Freq: Once | INTRAVENOUS | Status: AC
  Administered 2022-04-09: 10 mL

## 2022-04-09 MED ORDER — DEXAMETHASONE 4 MG PO TABS
12.0000 mg | ORAL_TABLET | Freq: Once | ORAL | Status: AC
  Administered 2022-04-09: 12 mg via ORAL
  Filled 2022-04-09: qty 3

## 2022-04-09 MED ORDER — DEXTROSE 5 % IV SOLN
36.0000 mg/m2 | Freq: Once | INTRAVENOUS | Status: AC
  Administered 2022-04-09: 66 mg via INTRAVENOUS
  Filled 2022-04-09: qty 5

## 2022-04-09 MED ORDER — FAMOTIDINE 20 MG PO TABS
20.0000 mg | ORAL_TABLET | Freq: Once | ORAL | Status: AC
  Administered 2022-04-09: 20 mg via ORAL
  Filled 2022-04-09: qty 1

## 2022-04-09 MED ORDER — PROCHLORPERAZINE MALEATE 10 MG PO TABS
10.0000 mg | ORAL_TABLET | Freq: Once | ORAL | Status: AC
  Administered 2022-04-09: 10 mg via ORAL
  Filled 2022-04-09: qty 1

## 2022-04-09 MED ORDER — ACETAMINOPHEN 325 MG PO TABS
650.0000 mg | ORAL_TABLET | Freq: Once | ORAL | Status: AC
  Administered 2022-04-09: 650 mg via ORAL
  Filled 2022-04-09: qty 2

## 2022-04-09 MED ORDER — DIPHENHYDRAMINE HCL 25 MG PO CAPS
25.0000 mg | ORAL_CAPSULE | Freq: Once | ORAL | Status: AC
  Administered 2022-04-09: 25 mg via ORAL
  Filled 2022-04-09: qty 1

## 2022-04-09 NOTE — Progress Notes (Signed)
? ? ?HEMATOLOGY/ONCOLOGY CLINIC NOTE ? ?Date of Service: 04/09/2022 ? ?Patient Care Team: ?Hoyt Koch, MD as PCP - General (Internal Medicine) ?Lafayette Dragon, MD (Inactive) as Consulting Physician (Gastroenterology) ?Megan Salon, MD as Consulting Physician (Gynecology) ?Melrose Nakayama, MD as Consulting Physician (Orthopedic Surgery) ?Deneise Lever, MD as Consulting Physician (Pulmonary Disease) ?[name], MD (Ophthalmology) ? ?CHIEF COMPLAINTS/PURPOSE OF CONSULTATION:  ?Follow-up for continued evaluation and management of multiple myeloma ? ?HISTORY OF PRESENTING ILLNESS:  ?Please see previous notes for details on initial presentation ? ?Current Treatment: ?Carfilzomib + Revlimid maintenance. ? ?INTERVAL HISTORY:  ? ?Faith Orr is a 81 y.o. female here for continued evaluation and management of her multiple myeloma. Her last clinic visit with [name] was on 02/26/2022. She reports She is doing well with no new symptoms or concerns. ? ?She reports that when she wakes up she feels hot. ? ?She notes that when she is on her feet for extended periods of time she starts to feel fatigued and needs to rest. ? ?She reports she is tolerating her current treatment well with no new prohibitive toxicities. ? ?She notes that she has seen some improvement in her grade 1 fatigue. We discussed that this could be due to the Revlimid. ? ?She reports soreness in right shoulder and some inhibition with right arm function. She notes she uses her walker regularly, but mostly uses her cane. She reports that uses minimal assistance for the most part. ? ?We discussed doing a rpt PET CT scan in 5 weeks and holding off bone marrow biopsy.  ? ?She notes she is pleased with her response to treatment. ? ?No fever, chills, night sweats. ?No other new or acute symptoms. ? ?Labs today showed CBC with a hemoglobin of 11.7, mild leukopenia has resolved with a WBC count of 5.9k with ANC of 4000 and platelets of 109k ?CMP  showed normal creatinine of 1.17 consistent with mild chronic kidney disease. blood sugar of 239k. ? ?MEDICAL HISTORY:  ?Past Medical History:  ?Diagnosis Date  ? Allergy   ? seasonal  ? Asthma   ? DEPRESSION   ? DIABETES MELLITUS, TYPE II   ? Diverticulosis   ? HYPERLIPIDEMIA   ? Macular degeneration of left eye   ? mild, Dr.Hecker  ? Obesity, unspecified   ? Osteoarthritis of both knees   ? OSTEOPENIA   ? Osteopenia   ? URINARY INCONTINENCE   ? ? ?SURGICAL HISTORY: ?Past Surgical History:  ?Procedure Laterality Date  ? CATARACT EXTRACTION Left 05/24/2018  ? CESAREAN SECTION  01/1973  ? CYSTOSCOPY/URETEROSCOPY/HOLMIUM LASER/STENT PLACEMENT Right 09/20/2019  ? Procedure: CYSTOSCOPY/URETEROSCOPY/HOLMIUM LASER/STENT PLACEMENT;  Surgeon: Lucas Mallow, MD;  [facility];  Service: Urology;  Laterality: Right;  ? FRACTURE SURGERY    ? IR IMAGING GUIDED PORT INSERTION  02/20/2019  ? left wrist surgery  2008  ? By Dr. Latanya Maudlin  ? right ankle  1994  ? ? ?SOCIAL HISTORY: ?Social History  ? ?Socioeconomic History  ? Marital status: Married  ?  Spouse name: Not on file  ? Number of children: 1  ? Years of education: Not on file  ? Highest education level: Not on file  ?Occupational History  ?  Employer: [employer]  ?Tobacco Use  ? Smoking status: Never  ? Smokeless tobacco: Never  ? Tobacco comments:  ?  Lives with partner [name]) and son  ?Vaping Use  ? Vaping Use: Never used  ?Substance  and Sexual Activity  ? Alcohol use: No  ?  Alcohol/week: 0.0 standard drinks  ? Drug use: No  ? Sexual activity: Never  ?  Partners: Female  ?  Birth control/protection: Post-menopausal  ?  Comment: Lives with female partner (annette hicks) and 55 yo son  ?Other Topics Concern  ? Not on file  ?Social History Narrative  ? Not on file  ? ?Social Determinants of Health  ? ?Financial Resource Strain: Low Risk   ? Difficulty of Paying Living Expenses: Not hard at all  ?Food Insecurity: No Food  Insecurity  ? Worried About Charity fundraiser in the Last Year: Never true  ? Ran Out of Food in the Last Year: Never true  ?Transportation Needs: No Transportation Needs  ? Lack of Transportation (Medical): No  ? Lack of Transportation (Non-Medical): No  ?Physical Activity: Unknown  ? Days of Exercise per Week: Patient refused  ? Minutes of Exercise per Session: Patient refused  ?Stress: No Stress Concern Present  ? Feeling of Stress : Not at all  ?Social Connections: Unknown  ? Frequency of Communication with Friends and Family: Patient refused  ? Frequency of Social Gatherings with Friends and Family: Patient refused  ? Attends Religious Services: Patient refused  ? Active Member of Clubs or Organizations: Patient refused  ? Attends Archivist Meetings: Patient refused  ? Marital Status: Living with partner  ?Intimate Partner Violence: Not At Risk  ? Fear of Current or Ex-Partner: No  ? Emotionally Abused: No  ? Physically Abused: No  ? Sexually Abused: No  ? ? ?FAMILY HISTORY: ?Family History  ?Problem Relation Age of Onset  ? Diabetes Father   ? Hyperlipidemia Father   ? Heart disease Father   ? Cancer Father   ? Hypertension Father   ? Colon cancer Paternal Grandmother 37  ? Osteoporosis Mother   ? Protein S deficiency Mother   ? Hyperlipidemia Mother   ? Multiple sclerosis Daughter   ? Cancer Other   ?     bladder  ? Breast cancer Neg Hx   ? ? ?ALLERGIES:  is allergic to levofloxacin, penicillins, aleve [naproxen sodium], and sulfonamide derivatives. ? ?MEDICATIONS:  ?Current Outpatient Medications  ?Medication Sig Dispense Refill  ? acyclovir (ZOVIRAX) 400 MG tablet TAKE 1 TABLET(400 MG) BY MOUTH TWICE DAILY 60 tablet 5  ? Blood Glucose Monitoring Suppl (FREESTYLE FREEDOM LITE) W/DEVICE KIT Use to check blood sugars twice a day ?Dx 250.00 1 each 0  ? Calcium Carbonate-Vitamin D 600-400 MG-UNIT tablet Take 1 tablet by mouth 2 (two) times daily.    ? dexamethasone (DECADRON) 4 MG tablet Take 5  tablets (20 mg total) by mouth once a week. On D22 of each cycle of treatment 20 tablet 5  ? ELIQUIS 2.5 MG TABS tablet TAKE 1 TABLET(2.5 MG) BY MOUTH TWICE DAILY 60 tablet 2  ? fentaNYL (DURAGESIC) 12 MCG/HR Place 1 patch onto the skin every 3 (three) days. 10 patch 0  ? fluticasone (FLONASE) 50 MCG/ACT nasal spray Place 1 spray into both nostrils daily. 16 g 2  ? glucose blood (FREESTYLE LITE) test strip CHECK BLOOD SUGAR TWICE DAILY AS DIRECTED ?Dx 250.00 180 each 3  ? Lancets (FREESTYLE) lancets Use twice daily to check sugars. 100 each 11  ? lenalidomide (REVLIMID) 5 MG capsule TAKE 1 CAPSULE BY MOUTH DAILY  FOR 21 DAYS, THEN 7 DAYS OFF 21 capsule 0  ? lidocaine-prilocaine (EMLA) cream APPLY 1 APPLICATION TO  THE AFFECTED AREA AS NEEDED. USE PRIOR TO PORT ACCESS 30 g 0  ? metFORMIN (GLUCOPHAGE-XR) 500 MG 24 hr tablet TAKE 3 TABLETS(1500 MG) BY MOUTH DAILY WITH BREAKFAST (Patient taking differently: Take 1 tablet in AM and 1 Tablet in PM) 90 tablet 1  ? Multiple Vitamins-Minerals (ICAPS) CAPS Take 1 capsule by mouth daily after breakfast.    ? ondansetron (ZOFRAN) 8 MG tablet Take 1 tablet (8 mg total) by mouth 2 (two) times daily as needed (Nausea or vomiting). 30 tablet 1  ? Oxycodone HCl 10 MG TABS Take 1 tablet (10 mg total) by mouth every 6 (six) hours as needed. 90 tablet 0  ? polyethylene glycol (MIRALAX / GLYCOLAX) packet Take 17 g by mouth daily after breakfast.    ? potassium chloride SA (KLOR-CON M) 20 MEQ tablet TAKE 2 TABLETS BY MOUTH TWICE DAILY 360 tablet 1  ? prochlorperazine (COMPAZINE) 10 MG tablet Take 1 tablet (10 mg total) by mouth every 6 (six) hours as needed (Nausea or vomiting). 30 tablet 1  ? senna-docusate (SENNA S) 8.6-50 MG tablet Take 2 tablets by mouth at bedtime. 60 tablet 2  ? sertraline (ZOLOFT) 100 MG tablet TAKE 1 TABLET(100 MG) BY MOUTH DAILY 30 tablet 3  ? simvastatin (ZOCOR) 20 MG tablet TAKE 1 TABLET BY MOUTH EVERY DAY AT 6 PM 90 tablet 3  ? Vitamin D, Ergocalciferol,  (DRISDOL) 1.25 MG (50000 UNIT) CAPS capsule TAKE 1 CAPSULE BY MOUTH EVERY 7 DAYS 12 capsule 0  ? ?No current facility-administered medications for this visit.  ? ?Facility-Administered Medications Ordered in Other Visits

## 2022-04-09 NOTE — Patient Instructions (Signed)
[facility]  Discharge Instructions: Thank you for choosing [facility] to provide your oncology and hematology care.   If you have a lab appointment with the Cancer Center, please go directly to the Cancer Center and check in at the registration area.   Wear comfortable clothing and clothing appropriate for easy access to any Portacath or PICC line.   We strive to give you quality time with your provider. You may need to reschedule your appointment if you arrive late (15 or more minutes).  Arriving late affects you and other patients whose appointments are after yours.  Also, if you miss three or more appointments without notifying the office, you may be dismissed from the clinic at the provider's discretion.      For prescription refill requests, have your pharmacy contact our office and allow 72 hours for refills to be completed.    Today you received the following chemotherapy and/or immunotherapy agents: Kyprolis    To help prevent nausea and vomiting after your treatment, we encourage you to take your nausea medication as directed.  BELOW ARE SYMPTOMS THAT SHOULD BE REPORTED IMMEDIATELY: . *FEVER GREATER THAN 100.4 F (38 C) OR HIGHER . *CHILLS OR SWEATING . *NAUSEA AND VOMITING THAT IS NOT CONTROLLED WITH YOUR NAUSEA MEDICATION . *UNUSUAL SHORTNESS OF BREATH . *UNUSUAL BRUISING OR BLEEDING . *URINARY PROBLEMS (pain or burning when urinating, or frequent urination) . *BOWEL PROBLEMS (unusual diarrhea, constipation, pain near the anus) . TENDERNESS IN MOUTH AND THROAT WITH OR WITHOUT PRESENCE OF ULCERS (sore throat, sores in mouth, or a toothache) . UNUSUAL RASH, SWELLING OR PAIN  . UNUSUAL VAGINAL DISCHARGE OR ITCHING   Items with * indicate a potential emergency and should be followed up as soon as possible or go to the Emergency Department if any problems should occur.  Please show the CHEMOTHERAPY ALERT CARD or IMMUNOTHERAPY ALERT  CARD at check-in to the Emergency Department and triage nurse.  Should you have questions after your visit or need to cancel or reschedule your appointment, please contact [facility]  Dept: [phone]  and follow the prompts.  Office hours are 8:00 a.m. to 4:30 p.m. Monday - Friday. Please note that voicemails left after 4:00 p.m. may not be returned until the following business day.  We are closed weekends and major holidays. You have access to a nurse at all times for urgent questions. Please call the main number to the clinic Dept: [phone] and follow the prompts.   For any non-urgent questions, you may also contact your provider using MyChart. We now offer e-Visits for anyone 18 and older to request care online for non-urgent symptoms. For details visit [email or web address].   Also download the MyChart app! Go to the app store, search "MyChart", open the app, select [facility], and log in with your MyChart username and password.  Due to Covid, a mask is required upon entering the hospital/clinic. If you do not have a mask, one will be given to you upon arrival. For doctor visits, patients may have 1 support person aged 18 or older with them. For treatment visits, patients cannot have anyone with them due to current Covid guidelines and our immunocompromised population.   

## 2022-04-11 ENCOUNTER — Encounter: Payer: Self-pay | Admitting: Hematology

## 2022-04-14 ENCOUNTER — Telehealth: Payer: Self-pay | Admitting: Hematology

## 2022-04-14 NOTE — Telephone Encounter (Signed)
Left message with follow-up appointments per 4/27 los. ?

## 2022-04-23 ENCOUNTER — Inpatient Hospital Stay: Payer: Medicare PPO | Attending: Hematology

## 2022-04-23 ENCOUNTER — Other Ambulatory Visit: Payer: Self-pay | Admitting: Hematology

## 2022-04-23 ENCOUNTER — Inpatient Hospital Stay: Payer: Medicare PPO

## 2022-04-23 ENCOUNTER — Other Ambulatory Visit: Payer: Self-pay

## 2022-04-23 VITALS — BP 124/66 | HR 75 | Temp 98.9°F | Resp 18 | Wt 148.1 lb

## 2022-04-23 DIAGNOSIS — C9000 Multiple myeloma not having achieved remission: Secondary | ICD-10-CM | POA: Insufficient documentation

## 2022-04-23 DIAGNOSIS — Z79899 Other long term (current) drug therapy: Secondary | ICD-10-CM | POA: Insufficient documentation

## 2022-04-23 DIAGNOSIS — Z95828 Presence of other vascular implants and grafts: Secondary | ICD-10-CM

## 2022-04-23 DIAGNOSIS — Z5112 Encounter for antineoplastic immunotherapy: Secondary | ICD-10-CM | POA: Diagnosis not present

## 2022-04-23 DIAGNOSIS — Z7189 Other specified counseling: Secondary | ICD-10-CM

## 2022-04-23 LAB — CBC WITH DIFFERENTIAL (CANCER CENTER ONLY)
Abs Immature Granulocytes: 0.01 10*3/uL (ref 0.00–0.07)
Basophils Absolute: 0 10*3/uL (ref 0.0–0.1)
Basophils Relative: 0 %
Eosinophils Absolute: 0.1 10*3/uL (ref 0.0–0.5)
Eosinophils Relative: 2 %
HCT: 33.1 % — ABNORMAL LOW (ref 36.0–46.0)
Hemoglobin: 11.4 g/dL — ABNORMAL LOW (ref 12.0–15.0)
Immature Granulocytes: 0 %
Lymphocytes Relative: 22 %
Lymphs Abs: 0.8 10*3/uL (ref 0.7–4.0)
MCH: 34.9 pg — ABNORMAL HIGH (ref 26.0–34.0)
MCHC: 34.4 g/dL (ref 30.0–36.0)
MCV: 101.2 fL — ABNORMAL HIGH (ref 80.0–100.0)
Monocytes Absolute: 0.6 10*3/uL (ref 0.1–1.0)
Monocytes Relative: 18 %
Neutro Abs: 2 10*3/uL (ref 1.7–7.7)
Neutrophils Relative %: 58 %
Platelet Count: 134 10*3/uL — ABNORMAL LOW (ref 150–400)
RBC: 3.27 MIL/uL — ABNORMAL LOW (ref 3.87–5.11)
RDW: 13.4 % (ref 11.5–15.5)
WBC Count: 3.4 10*3/uL — ABNORMAL LOW (ref 4.0–10.5)
nRBC: 0 % (ref 0.0–0.2)

## 2022-04-23 LAB — CMP (CANCER CENTER ONLY)
ALT: 11 U/L (ref 0–44)
AST: 11 U/L — ABNORMAL LOW (ref 15–41)
Albumin: 3.8 g/dL (ref 3.5–5.0)
Alkaline Phosphatase: 86 U/L (ref 38–126)
Anion gap: 6 (ref 5–15)
BUN: 16 mg/dL (ref 8–23)
CO2: 25 mmol/L (ref 22–32)
Calcium: 9.6 mg/dL (ref 8.9–10.3)
Chloride: 107 mmol/L (ref 98–111)
Creatinine: 1.29 mg/dL — ABNORMAL HIGH (ref 0.44–1.00)
GFR, Estimated: 42 mL/min — ABNORMAL LOW (ref >60)
Glucose, Bld: 170 mg/dL — ABNORMAL HIGH (ref 70–99)
Potassium: 4.3 mmol/L (ref 3.5–5.1)
Sodium: 138 mmol/L (ref 135–145)
Total Bilirubin: 0.6 mg/dL (ref 0.3–1.2)
Total Protein: 6.7 g/dL (ref 6.5–8.1)

## 2022-04-23 MED ORDER — PROCHLORPERAZINE MALEATE 10 MG PO TABS
10.0000 mg | ORAL_TABLET | Freq: Once | ORAL | Status: AC
  Administered 2022-04-23: 10 mg via ORAL
  Filled 2022-04-23: qty 1

## 2022-04-23 MED ORDER — SODIUM CHLORIDE 0.9% FLUSH
10.0000 mL | INTRAVENOUS | Status: DC | PRN
  Administered 2022-04-23: 10 mL

## 2022-04-23 MED ORDER — ZOLEDRONIC ACID 4 MG/100ML IV SOLN
4.0000 mg | Freq: Once | INTRAVENOUS | Status: AC
  Administered 2022-04-23: 4 mg via INTRAVENOUS
  Filled 2022-04-23: qty 100

## 2022-04-23 MED ORDER — NYSTATIN 100000 UNIT/ML MT SUSP: 5.0000 mL | Freq: Four times a day (QID) | OROMUCOSAL | 0 refills | Status: AC

## 2022-04-23 MED ORDER — ACETAMINOPHEN 325 MG PO TABS
650.0000 mg | ORAL_TABLET | Freq: Once | ORAL | Status: AC
  Administered 2022-04-23: 650 mg via ORAL
  Filled 2022-04-23: qty 2

## 2022-04-23 MED ORDER — SODIUM CHLORIDE 0.9 % IV SOLN: Freq: Once | INTRAVENOUS | Status: AC

## 2022-04-23 MED ORDER — DEXTROSE 5 % IV SOLN
36.0000 mg/m2 | Freq: Once | INTRAVENOUS | Status: AC
  Administered 2022-04-23: 66 mg via INTRAVENOUS
  Filled 2022-04-23: qty 30

## 2022-04-23 MED ORDER — SODIUM CHLORIDE 0.9% FLUSH
10.0000 mL | Freq: Once | INTRAVENOUS | Status: AC
  Administered 2022-04-23: 10 mL

## 2022-04-23 MED ORDER — DEXAMETHASONE 4 MG PO TABS
12.0000 mg | ORAL_TABLET | Freq: Once | ORAL | Status: AC
  Administered 2022-04-23: 12 mg via ORAL
  Filled 2022-04-23: qty 3

## 2022-04-23 MED ORDER — HEPARIN SOD (PORK) LOCK FLUSH 100 UNIT/ML IV SOLN
500.0000 [IU] | Freq: Once | INTRAVENOUS | Status: AC | PRN
  Administered 2022-04-23: 500 [IU]

## 2022-04-23 MED ORDER — FAMOTIDINE 20 MG PO TABS
20.0000 mg | ORAL_TABLET | Freq: Once | ORAL | Status: AC
  Administered 2022-04-23: 20 mg via ORAL
  Filled 2022-04-23: qty 1

## 2022-04-23 MED ORDER — DIPHENHYDRAMINE HCL 25 MG PO CAPS
25.0000 mg | ORAL_CAPSULE | Freq: Once | ORAL | Status: AC
  Administered 2022-04-23: 25 mg via ORAL
  Filled 2022-04-23: qty 1

## 2022-04-23 NOTE — Patient Instructions (Signed)
[facility]  Discharge Instructions: Thank you for choosing [facility] to provide your oncology and hematology care.   If you have a lab appointment with the Cancer Center, please go directly to the Cancer Center and check in at the registration area.   Wear comfortable clothing and clothing appropriate for easy access to any Portacath or PICC line.   We strive to give you quality time with your provider. You may need to reschedule your appointment if you arrive late (15 or more minutes).  Arriving late affects you and other patients whose appointments are after yours.  Also, if you miss three or more appointments without notifying the office, you may be dismissed from the clinic at the provider's discretion.      For prescription refill requests, have your pharmacy contact our office and allow 72 hours for refills to be completed.    Today you received the following chemotherapy and/or immunotherapy agents: Kyprolis    To help prevent nausea and vomiting after your treatment, we encourage you to take your nausea medication as directed.  BELOW ARE SYMPTOMS THAT SHOULD BE REPORTED IMMEDIATELY: . *FEVER GREATER THAN 100.4 F (38 C) OR HIGHER . *CHILLS OR SWEATING . *NAUSEA AND VOMITING THAT IS NOT CONTROLLED WITH YOUR NAUSEA MEDICATION . *UNUSUAL SHORTNESS OF BREATH . *UNUSUAL BRUISING OR BLEEDING . *URINARY PROBLEMS (pain or burning when urinating, or frequent urination) . *BOWEL PROBLEMS (unusual diarrhea, constipation, pain near the anus) . TENDERNESS IN MOUTH AND THROAT WITH OR WITHOUT PRESENCE OF ULCERS (sore throat, sores in mouth, or a toothache) . UNUSUAL RASH, SWELLING OR PAIN  . UNUSUAL VAGINAL DISCHARGE OR ITCHING   Items with * indicate a potential emergency and should be followed up as soon as possible or go to the Emergency Department if any problems should occur.  Please show the CHEMOTHERAPY ALERT CARD or IMMUNOTHERAPY ALERT  CARD at check-in to the Emergency Department and triage nurse.  Should you have questions after your visit or need to cancel or reschedule your appointment, please contact [facility]  Dept: [phone]  and follow the prompts.  Office hours are 8:00 a.m. to 4:30 p.m. Monday - Friday. Please note that voicemails left after 4:00 p.m. may not be returned until the following business day.  We are closed weekends and major holidays. You have access to a nurse at all times for urgent questions. Please call the main number to the clinic Dept: [phone] and follow the prompts.   For any non-urgent questions, you may also contact your provider using MyChart. We now offer e-Visits for anyone 18 and older to request care online for non-urgent symptoms. For details visit [email or web address].   Also download the MyChart app! Go to the app store, search "MyChart", open the app, select , and log in with your MyChart username and password.  Due to Covid, a mask is required upon entering the hospital/clinic. If you do not have a mask, one will be given to you upon arrival. For doctor visits, patients may have 1 support person aged 18 or older with them. For treatment visits, patients cannot have anyone with them due to current Covid guidelines and our immunocompromised population.   

## 2022-04-27 ENCOUNTER — Other Ambulatory Visit: Payer: Self-pay | Admitting: *Deleted

## 2022-04-27 DIAGNOSIS — C9000 Multiple myeloma not having achieved remission: Secondary | ICD-10-CM

## 2022-04-27 MED ORDER — LENALIDOMIDE 5 MG PO CAPS: ORAL_CAPSULE | ORAL | 0 refills | Status: DC

## 2022-04-28 ENCOUNTER — Other Ambulatory Visit: Payer: Self-pay | Admitting: Hematology

## 2022-04-28 ENCOUNTER — Encounter: Payer: Self-pay | Admitting: Hematology

## 2022-04-28 DIAGNOSIS — C9000 Multiple myeloma not having achieved remission: Secondary | ICD-10-CM

## 2022-04-28 MED ORDER — FENTANYL 12 MCG/HR TD PT72: 1.0000 | MEDICATED_PATCH | TRANSDERMAL | 0 refills | Status: DC

## 2022-04-28 MED ORDER — LENALIDOMIDE 5 MG PO CAPS: ORAL_CAPSULE | ORAL | 0 refills | Status: DC

## 2022-05-03 ENCOUNTER — Encounter: Payer: Self-pay | Admitting: Internal Medicine

## 2022-05-04 ENCOUNTER — Other Ambulatory Visit: Payer: Self-pay

## 2022-05-04 MED ORDER — METFORMIN HCL ER 500 MG PO TB24: 1500.0000 mg | ORAL_TABLET | Freq: Every day | ORAL | 1 refills | Status: DC

## 2022-05-04 MED ORDER — SIMVASTATIN 20 MG PO TABS: ORAL_TABLET | ORAL | 3 refills | Status: DC

## 2022-05-06 ENCOUNTER — Other Ambulatory Visit: Payer: Self-pay

## 2022-05-06 MED ORDER — SERTRALINE HCL 100 MG PO TABS: ORAL_TABLET | ORAL | 0 refills | Status: DC

## 2022-05-07 ENCOUNTER — Inpatient Hospital Stay: Payer: Medicare PPO

## 2022-05-07 ENCOUNTER — Other Ambulatory Visit: Payer: Self-pay | Admitting: Hematology

## 2022-05-07 ENCOUNTER — Other Ambulatory Visit: Payer: Self-pay

## 2022-05-07 VITALS — BP 130/70 | HR 66 | Temp 98.2°F | Resp 16 | Wt 151.5 lb

## 2022-05-07 DIAGNOSIS — Z79899 Other long term (current) drug therapy: Secondary | ICD-10-CM | POA: Diagnosis not present

## 2022-05-07 DIAGNOSIS — C9000 Multiple myeloma not having achieved remission: Secondary | ICD-10-CM

## 2022-05-07 DIAGNOSIS — Z5112 Encounter for antineoplastic immunotherapy: Secondary | ICD-10-CM | POA: Diagnosis not present

## 2022-05-07 DIAGNOSIS — Z95828 Presence of other vascular implants and grafts: Secondary | ICD-10-CM

## 2022-05-07 DIAGNOSIS — Z7189 Other specified counseling: Secondary | ICD-10-CM

## 2022-05-07 LAB — CMP (CANCER CENTER ONLY)
ALT: 11 U/L (ref 0–44)
AST: 12 U/L — ABNORMAL LOW (ref 15–41)
Albumin: 3.8 g/dL (ref 3.5–5.0)
Alkaline Phosphatase: 89 U/L (ref 38–126)
Anion gap: 8 (ref 5–15)
BUN: 23 mg/dL (ref 8–23)
CO2: 22 mmol/L (ref 22–32)
Calcium: 10 mg/dL (ref 8.9–10.3)
Chloride: 107 mmol/L (ref 98–111)
Creatinine: 1.12 mg/dL — ABNORMAL HIGH (ref 0.44–1.00)
GFR, Estimated: 50 mL/min — ABNORMAL LOW (ref >60)
Glucose, Bld: 164 mg/dL — ABNORMAL HIGH (ref 70–99)
Potassium: 3.9 mmol/L (ref 3.5–5.1)
Sodium: 137 mmol/L (ref 135–145)
Total Bilirubin: 0.4 mg/dL (ref 0.3–1.2)
Total Protein: 6.5 g/dL (ref 6.5–8.1)

## 2022-05-07 LAB — CBC WITH DIFFERENTIAL (CANCER CENTER ONLY)
Abs Immature Granulocytes: 0.03 10*3/uL (ref 0.00–0.07)
Basophils Absolute: 0 10*3/uL (ref 0.0–0.1)
Basophils Relative: 1 %
Eosinophils Absolute: 0.1 10*3/uL (ref 0.0–0.5)
Eosinophils Relative: 2 %
HCT: 34.1 % — ABNORMAL LOW (ref 36.0–46.0)
Hemoglobin: 11.5 g/dL — ABNORMAL LOW (ref 12.0–15.0)
Immature Granulocytes: 1 %
Lymphocytes Relative: 21 %
Lymphs Abs: 1 10*3/uL (ref 0.7–4.0)
MCH: 34.2 pg — ABNORMAL HIGH (ref 26.0–34.0)
MCHC: 33.7 g/dL (ref 30.0–36.0)
MCV: 101.5 fL — ABNORMAL HIGH (ref 80.0–100.0)
Monocytes Absolute: 0.6 10*3/uL (ref 0.1–1.0)
Monocytes Relative: 12 %
Neutro Abs: 2.8 10*3/uL (ref 1.7–7.7)
Neutrophils Relative %: 63 %
Platelet Count: 170 10*3/uL (ref 150–400)
RBC: 3.36 MIL/uL — ABNORMAL LOW (ref 3.87–5.11)
RDW: 13.4 % (ref 11.5–15.5)
WBC Count: 4.5 10*3/uL (ref 4.0–10.5)
nRBC: 0 % (ref 0.0–0.2)

## 2022-05-07 MED ORDER — HEPARIN SOD (PORK) LOCK FLUSH 100 UNIT/ML IV SOLN
500.0000 [IU] | Freq: Once | INTRAVENOUS | Status: AC | PRN
  Administered 2022-05-07: 500 [IU]

## 2022-05-07 MED ORDER — SODIUM CHLORIDE 0.9 % IV SOLN: Freq: Once | INTRAVENOUS | Status: AC

## 2022-05-07 MED ORDER — DEXAMETHASONE 4 MG PO TABS
12.0000 mg | ORAL_TABLET | Freq: Once | ORAL | Status: AC
  Administered 2022-05-07: 12 mg via ORAL
  Filled 2022-05-07: qty 3

## 2022-05-07 MED ORDER — ACETAMINOPHEN 325 MG PO TABS
650.0000 mg | ORAL_TABLET | Freq: Once | ORAL | Status: AC
  Administered 2022-05-07: 650 mg via ORAL
  Filled 2022-05-07: qty 2

## 2022-05-07 MED ORDER — FAMOTIDINE 20 MG PO TABS
20.0000 mg | ORAL_TABLET | Freq: Once | ORAL | Status: AC
  Administered 2022-05-07: 20 mg via ORAL
  Filled 2022-05-07: qty 1

## 2022-05-07 MED ORDER — PROCHLORPERAZINE MALEATE 10 MG PO TABS
10.0000 mg | ORAL_TABLET | Freq: Once | ORAL | Status: AC
  Administered 2022-05-07: 10 mg via ORAL
  Filled 2022-05-07: qty 1

## 2022-05-07 MED ORDER — SODIUM CHLORIDE 0.9% FLUSH
10.0000 mL | Freq: Once | INTRAVENOUS | Status: AC
  Administered 2022-05-07: 10 mL

## 2022-05-07 MED ORDER — SODIUM CHLORIDE 0.9% FLUSH
10.0000 mL | INTRAVENOUS | Status: DC | PRN
  Administered 2022-05-07: 10 mL

## 2022-05-07 MED ORDER — DIPHENHYDRAMINE HCL 25 MG PO CAPS
25.0000 mg | ORAL_CAPSULE | Freq: Once | ORAL | Status: AC
  Administered 2022-05-07: 25 mg via ORAL
  Filled 2022-05-07: qty 1

## 2022-05-07 MED ORDER — DEXTROSE 5 % IV SOLN
60.0000 mg | Freq: Once | INTRAVENOUS | Status: AC
  Administered 2022-05-07: 60 mg via INTRAVENOUS
  Filled 2022-05-07: qty 30

## 2022-05-07 NOTE — Patient Instructions (Signed)
[facility]  Discharge Instructions: Thank you for choosing [facility] to provide your oncology and hematology care.   If you have a lab appointment with the Cancer Center, please go directly to the Cancer Center and check in at the registration area.   Wear comfortable clothing and clothing appropriate for easy access to any Portacath or PICC line.   We strive to give you quality time with your provider. You may need to reschedule your appointment if you arrive late (15 or more minutes).  Arriving late affects you and other patients whose appointments are after yours.  Also, if you miss three or more appointments without notifying the office, you may be dismissed from the clinic at the provider's discretion.      For prescription refill requests, have your pharmacy contact our office and allow 72 hours for refills to be completed.    Today you received the following chemotherapy and/or immunotherapy agents: Kyprolis    To help prevent nausea and vomiting after your treatment, we encourage you to take your nausea medication as directed.  BELOW ARE SYMPTOMS THAT SHOULD BE REPORTED IMMEDIATELY: . *FEVER GREATER THAN 100.4 F (38 C) OR HIGHER . *CHILLS OR SWEATING . *NAUSEA AND VOMITING THAT IS NOT CONTROLLED WITH YOUR NAUSEA MEDICATION . *UNUSUAL SHORTNESS OF BREATH . *UNUSUAL BRUISING OR BLEEDING . *URINARY PROBLEMS (pain or burning when urinating, or frequent urination) . *BOWEL PROBLEMS (unusual diarrhea, constipation, pain near the anus) . TENDERNESS IN MOUTH AND THROAT WITH OR WITHOUT PRESENCE OF ULCERS (sore throat, sores in mouth, or a toothache) . UNUSUAL RASH, SWELLING OR PAIN  . UNUSUAL VAGINAL DISCHARGE OR ITCHING   Items with * indicate a potential emergency and should be followed up as soon as possible or go to the Emergency Department if any problems should occur.  Please show the CHEMOTHERAPY ALERT CARD or IMMUNOTHERAPY ALERT  CARD at check-in to the Emergency Department and triage nurse.  Should you have questions after your visit or need to cancel or reschedule your appointment, please contact [facility]  Dept: [phone]  and follow the prompts.  Office hours are 8:00 a.m. to 4:30 p.m. Monday - Friday. Please note that voicemails left after 4:00 p.m. may not be returned until the following business day.  We are closed weekends and major holidays. You have access to a nurse at all times for urgent questions. Please call the main number to the clinic Dept: [phone] and follow the prompts.   For any non-urgent questions, you may also contact your provider using MyChart. We now offer e-Visits for anyone 18 and older to request care online for non-urgent symptoms. For details visit [email or web address].   Also download the MyChart app! Go to the app store, search "MyChart", open the app, select , and log in with your MyChart username and password.  Due to Covid, a mask is required upon entering the hospital/clinic. If you do not have a mask, one will be given to you upon arrival. For doctor visits, patients may have 1 support person aged 18 or older with them. For treatment visits, patients cannot have anyone with them due to current Covid guidelines and our immunocompromised population.   

## 2022-05-10 ENCOUNTER — Other Ambulatory Visit: Payer: Self-pay | Admitting: Hematology

## 2022-05-10 DIAGNOSIS — C9000 Multiple myeloma not having achieved remission: Secondary | ICD-10-CM

## 2022-05-12 ENCOUNTER — Encounter: Payer: Self-pay | Admitting: Hematology

## 2022-05-18 ENCOUNTER — Other Ambulatory Visit: Payer: Self-pay | Admitting: Hematology

## 2022-05-18 DIAGNOSIS — C9000 Multiple myeloma not having achieved remission: Secondary | ICD-10-CM

## 2022-05-19 ENCOUNTER — Encounter: Payer: Self-pay | Admitting: Hematology

## 2022-05-19 MED ORDER — OXYCODONE HCL 10 MG PO TABS: 10.0000 mg | ORAL_TABLET | Freq: Four times a day (QID) | ORAL | 0 refills | Status: DC | PRN

## 2022-05-20 ENCOUNTER — Other Ambulatory Visit: Payer: Self-pay | Admitting: Hematology

## 2022-05-20 DIAGNOSIS — C9000 Multiple myeloma not having achieved remission: Secondary | ICD-10-CM

## 2022-05-21 ENCOUNTER — Inpatient Hospital Stay: Payer: Medicare PPO

## 2022-05-21 ENCOUNTER — Other Ambulatory Visit: Payer: Self-pay

## 2022-05-21 DIAGNOSIS — C9000 Multiple myeloma not having achieved remission: Secondary | ICD-10-CM

## 2022-05-21 MED ORDER — LENALIDOMIDE 5 MG PO CAPS: ORAL_CAPSULE | ORAL | 0 refills | Status: DC

## 2022-05-22 ENCOUNTER — Inpatient Hospital Stay: Payer: Medicare PPO | Attending: Hematology

## 2022-05-22 ENCOUNTER — Inpatient Hospital Stay (HOSPITAL_BASED_OUTPATIENT_CLINIC_OR_DEPARTMENT_OTHER): Payer: Medicare PPO | Admitting: Hematology

## 2022-05-22 ENCOUNTER — Inpatient Hospital Stay: Payer: Medicare PPO

## 2022-05-22 ENCOUNTER — Other Ambulatory Visit: Payer: Self-pay

## 2022-05-22 VITALS — BP 121/70 | HR 75 | Temp 97.9°F | Resp 15 | Ht 59.0 in | Wt 149.2 lb

## 2022-05-22 DIAGNOSIS — C9000 Multiple myeloma not having achieved remission: Secondary | ICD-10-CM | POA: Insufficient documentation

## 2022-05-22 DIAGNOSIS — Z79899 Other long term (current) drug therapy: Secondary | ICD-10-CM | POA: Diagnosis not present

## 2022-05-22 DIAGNOSIS — Z5111 Encounter for antineoplastic chemotherapy: Secondary | ICD-10-CM | POA: Diagnosis not present

## 2022-05-22 DIAGNOSIS — Z95828 Presence of other vascular implants and grafts: Secondary | ICD-10-CM

## 2022-05-22 DIAGNOSIS — Z5112 Encounter for antineoplastic immunotherapy: Secondary | ICD-10-CM | POA: Insufficient documentation

## 2022-05-22 DIAGNOSIS — Z7189 Other specified counseling: Secondary | ICD-10-CM

## 2022-05-22 LAB — CBC WITH DIFFERENTIAL (CANCER CENTER ONLY)
Abs Immature Granulocytes: 0.01 10*3/uL (ref 0.00–0.07)
Basophils Absolute: 0 10*3/uL (ref 0.0–0.1)
Basophils Relative: 1 %
Eosinophils Absolute: 0.2 10*3/uL (ref 0.0–0.5)
Eosinophils Relative: 5 %
HCT: 35.8 % — ABNORMAL LOW (ref 36.0–46.0)
Hemoglobin: 12 g/dL (ref 12.0–15.0)
Immature Granulocytes: 0 %
Lymphocytes Relative: 30 %
Lymphs Abs: 1.1 10*3/uL (ref 0.7–4.0)
MCH: 33.3 pg (ref 26.0–34.0)
MCHC: 33.5 g/dL (ref 30.0–36.0)
MCV: 99.4 fL (ref 80.0–100.0)
Monocytes Absolute: 1 10*3/uL (ref 0.1–1.0)
Monocytes Relative: 27 %
Neutro Abs: 1.3 10*3/uL — ABNORMAL LOW (ref 1.7–7.7)
Neutrophils Relative %: 37 %
Platelet Count: 119 10*3/uL — ABNORMAL LOW (ref 150–400)
RBC: 3.6 MIL/uL — ABNORMAL LOW (ref 3.87–5.11)
RDW: 13.8 % (ref 11.5–15.5)
WBC Count: 3.5 10*3/uL — ABNORMAL LOW (ref 4.0–10.5)
nRBC: 0 % (ref 0.0–0.2)

## 2022-05-22 LAB — CMP (CANCER CENTER ONLY)
ALT: 13 U/L (ref 0–44)
AST: 12 U/L — ABNORMAL LOW (ref 15–41)
Albumin: 3.9 g/dL (ref 3.5–5.0)
Alkaline Phosphatase: 92 U/L (ref 38–126)
Anion gap: 6 (ref 5–15)
BUN: 16 mg/dL (ref 8–23)
CO2: 21 mmol/L — ABNORMAL LOW (ref 22–32)
Calcium: 9.8 mg/dL (ref 8.9–10.3)
Chloride: 110 mmol/L (ref 98–111)
Creatinine: 1.03 mg/dL — ABNORMAL HIGH (ref 0.44–1.00)
GFR, Estimated: 56 mL/min — ABNORMAL LOW (ref >60)
Glucose, Bld: 152 mg/dL — ABNORMAL HIGH (ref 70–99)
Potassium: 4.1 mmol/L (ref 3.5–5.1)
Sodium: 137 mmol/L (ref 135–145)
Total Bilirubin: 0.7 mg/dL (ref 0.3–1.2)
Total Protein: 6.4 g/dL — ABNORMAL LOW (ref 6.5–8.1)

## 2022-05-22 MED ORDER — SODIUM CHLORIDE 0.9% FLUSH
10.0000 mL | INTRAVENOUS | Status: DC | PRN
  Administered 2022-05-22: 10 mL

## 2022-05-22 MED ORDER — ACETAMINOPHEN 325 MG PO TABS
650.0000 mg | ORAL_TABLET | Freq: Once | ORAL | Status: AC
  Administered 2022-05-22: 650 mg via ORAL
  Filled 2022-05-22: qty 2

## 2022-05-22 MED ORDER — DIPHENHYDRAMINE HCL 25 MG PO CAPS
25.0000 mg | ORAL_CAPSULE | Freq: Once | ORAL | Status: AC
  Administered 2022-05-22: 25 mg via ORAL
  Filled 2022-05-22: qty 1

## 2022-05-22 MED ORDER — SODIUM CHLORIDE 0.9 % IV SOLN: Freq: Once | INTRAVENOUS | Status: AC

## 2022-05-22 MED ORDER — SODIUM CHLORIDE 0.9% FLUSH
10.0000 mL | Freq: Once | INTRAVENOUS | Status: AC
  Administered 2022-05-22: 10 mL via INTRAVENOUS

## 2022-05-22 MED ORDER — ZOLEDRONIC ACID 4 MG/100ML IV SOLN
4.0000 mg | Freq: Once | INTRAVENOUS | Status: AC
  Administered 2022-05-22: 4 mg via INTRAVENOUS
  Filled 2022-05-22: qty 100

## 2022-05-22 MED ORDER — FAMOTIDINE 20 MG PO TABS
20.0000 mg | ORAL_TABLET | Freq: Once | ORAL | Status: AC
  Administered 2022-05-22: 20 mg via ORAL
  Filled 2022-05-22: qty 1

## 2022-05-22 MED ORDER — PROCHLORPERAZINE MALEATE 10 MG PO TABS
10.0000 mg | ORAL_TABLET | Freq: Once | ORAL | Status: AC
  Administered 2022-05-22: 10 mg via ORAL
  Filled 2022-05-22: qty 1

## 2022-05-22 MED ORDER — HEPARIN SOD (PORK) LOCK FLUSH 100 UNIT/ML IV SOLN
500.0000 [IU] | Freq: Once | INTRAVENOUS | Status: AC | PRN
  Administered 2022-05-22: 500 [IU]

## 2022-05-22 MED ORDER — DEXAMETHASONE 4 MG PO TABS
12.0000 mg | ORAL_TABLET | Freq: Once | ORAL | Status: AC
  Administered 2022-05-22: 12 mg via ORAL
  Filled 2022-05-22: qty 3

## 2022-05-22 MED ORDER — DEXTROSE 5 % IV SOLN
33.0000 mg/m2 | Freq: Once | INTRAVENOUS | Status: AC
  Administered 2022-05-22: 60 mg via INTRAVENOUS
  Filled 2022-05-22: qty 30

## 2022-05-22 NOTE — Patient Instructions (Signed)
[facility]  Discharge Instructions: Thank you for choosing [facility] to provide your oncology and hematology care.   If you have a lab appointment with the [facility], please go directly to the [facility] and check in at the registration area.   Wear comfortable clothing and clothing appropriate for easy access to any Portacath or PICC line.   We strive to give you quality time with your provider. You may need to reschedule your appointment if you arrive late (15 or more minutes).  Arriving late affects you and other patients whose appointments are after yours.  Also, if you miss three or more appointments without notifying the office, you may be dismissed from the clinic at the provider's discretion.      For prescription refill requests, have your pharmacy contact our office and allow 72 hours for refills to be completed.    Today you received the following chemotherapy and/or immunotherapy agent: Carfilzomib (Kyprolis).   To help prevent nausea and vomiting after your treatment, we encourage you to take your nausea medication as directed.  BELOW ARE SYMPTOMS THAT SHOULD BE REPORTED IMMEDIATELY: *FEVER GREATER THAN 100.4 F (38 C) OR HIGHER *CHILLS OR SWEATING *NAUSEA AND VOMITING THAT IS NOT CONTROLLED WITH YOUR NAUSEA MEDICATION *UNUSUAL SHORTNESS OF BREATH *UNUSUAL BRUISING OR BLEEDING *URINARY PROBLEMS (pain or burning when urinating, or frequent urination) *BOWEL PROBLEMS (unusual diarrhea, constipation, pain near the anus) TENDERNESS IN MOUTH AND THROAT WITH OR WITHOUT PRESENCE OF ULCERS (sore throat, sores in mouth, or a toothache) UNUSUAL RASH, SWELLING OR PAIN  UNUSUAL VAGINAL DISCHARGE OR ITCHING   Items with * indicate a potential emergency and should be followed up as soon as possible or go to the Emergency Department if any problems should occur.  Please show the CHEMOTHERAPY ALERT CARD or IMMUNOTHERAPY ALERT CARD at  check-in to the Emergency Department and triage nurse.  Should you have questions after your visit or need to cancel or reschedule your appointment, please contact [facility]  Dept: [phone]  and follow the prompts.  Office hours are 8:00 a.m. to 4:30 p.m. Monday - Friday. Please note that voicemails left after 4:00 p.m. may not be returned until the following business day.  We are closed weekends and major holidays. You have access to a nurse at all times for urgent questions. Please call the main number to the clinic Dept: [phone] and follow the prompts.   For any non-urgent questions, you may also contact your provider using MyChart. We now offer e-Visits for anyone 62 and older to request care online for non-urgent symptoms. For details visit [email or web address].   Also download the MyChart app! Go to the app store, search "MyChart", open the app, select [facility], and log in with your MyChart username and password.  Due to Covid, a mask is required upon entering the hospital/clinic. If you do not have a mask, one will be given to you upon arrival. For doctor visits, patients may have 1 support person aged 16 or older with them. For treatment visits, patients cannot have anyone with them due to current Covid guidelines and our immunocompromised population.   Zoledronic Acid Injection (Cancer) What is this medication? ZOLEDRONIC ACID (ZOE le dron ik AS id) treats high calcium levels in the blood caused by cancer. It may also be used with chemotherapy to treat weakened bones caused by cancer. It works by slowing down the release of calcium from bones.  This lowers calcium levels in your blood. It also makes your bones stronger and less likely to break (fracture). It belongs to a group of medications called bisphosphonates. This medicine may be used for other purposes; ask your health care provider or pharmacist if you have questions. COMMON BRAND NAME(S):  Zometa, Zometa Powder What should I tell my care team before I take this medication? They need to know if you have any of these conditions: Dehydration Dental disease Kidney disease Liver disease Low levels of calcium in the blood Lung or breathing disease, such as asthma Receiving steroids, such as dexamethasone or prednisone An unusual or allergic reaction to zoledronic acid, other medications, foods, dyes, or preservatives Pregnant or trying to get pregnant Breast-feeding How should I use this medication? This medication is injected into a vein. It is given by your care team in a hospital or clinic setting. Talk to your care team about the use of this medication in children. Special care may be needed. Overdosage: If you think you have taken too much of this medicine contact a poison control center or emergency room at once. NOTE: This medicine is only for you. Do not share this medicine with others. What if I miss a dose? Keep appointments for follow-up doses. It is important not to miss your dose. Call your care team if you are unable to keep an appointment. What may interact with this medication? Certain antibiotics given by injection Diuretics, such as bumetanide, furosemide NSAIDs, medications for pain and inflammation, such as ibuprofen or naproxen Teriparatide Thalidomide This list may not describe all possible interactions. Give your health care provider a list of all the medicines, herbs, non-prescription drugs, or dietary supplements you use. Also tell them if you smoke, drink alcohol, or use illegal drugs. Some items may interact with your medicine. What should I watch for while using this medication? Visit your care team for regular checks on your progress. It may be some time before you see the benefit from this medication. Some people who take this medication have severe bone, joint, or muscle pain. This medication may also increase your risk for jaw problems or a broken  thigh bone. Tell your care team right away if you have severe pain in your jaw, bones, joints, or muscles. Tell you care team if you have any pain that does not go away or that gets worse. Tell your dentist and dental surgeon that you are taking this medication. You should not have major dental surgery while on this medication. See your dentist to have a dental exam and fix any dental problems before starting this medication. Take good care of your teeth while on this medication. Make sure you see your dentist for regular follow-up appointments. You should make sure you get enough calcium and vitamin D while you are taking this medication. Discuss the foods you eat and the vitamins you take with your care team. Check with your care team if you have severe diarrhea, nausea, and vomiting, or if you sweat a lot. The loss of too much body fluid may make it dangerous for you to take this medication. You may need bloodwork while taking this medication. Talk to your care team if you wish to become pregnant or think you might be pregnant. This medication can cause serious birth defects. What side effects may I notice from receiving this medication? Side effects that you should report to your care team as soon as possible: Allergic reactions--skin rash, itching, hives, swelling of the  face, lips, tongue, or throat Kidney injury--decrease in the amount of urine, swelling of the ankles, hands, or feet Low calcium level--muscle pain or cramps, confusion, tingling, or numbness in the hands or feet Osteonecrosis of the jaw--pain, swelling, or redness in the mouth, numbness of the jaw, poor healing after dental work, unusual discharge from the mouth, visible bones in the mouth Severe bone, joint, or muscle pain Side effects that usually do not require medical attention (report to your care team if they continue or are bothersome): Constipation Fatigue Fever Loss of appetite Nausea Stomach pain This list may not  describe all possible side effects. Call your doctor for medical advice about side effects. You may report side effects to FDA at 1-800-FDA-1088. Where should I keep my medication? This medication is given in a hospital or clinic. It will not be stored at home. NOTE: This sheet is a summary. It may not cover all possible information. If you have questions about this medicine, talk to your doctor, pharmacist, or health care provider.  2023 [employer]vier/[employer] Standard (2022-01-23 00:00:00)

## 2022-05-22 NOTE — Progress Notes (Signed)
Dr Irene Limbo has reviewed today's labs and is aware [place] is 1.3 . Pt is okay for tx today.

## 2022-05-28 ENCOUNTER — Other Ambulatory Visit: Payer: Self-pay | Admitting: Hematology

## 2022-05-28 DIAGNOSIS — C9000 Multiple myeloma not having achieved remission: Secondary | ICD-10-CM

## 2022-05-29 ENCOUNTER — Other Ambulatory Visit: Payer: Self-pay | Admitting: Hematology

## 2022-05-29 ENCOUNTER — Encounter: Payer: Self-pay | Admitting: Hematology

## 2022-05-29 DIAGNOSIS — C9000 Multiple myeloma not having achieved remission: Secondary | ICD-10-CM

## 2022-05-29 NOTE — Progress Notes (Signed)
HEMATOLOGY/ONCOLOGY CLINIC NOTE  Date of Service: 05/22/2022   Patient Care Team: Hoyt Koch, MD as PCP - General (Internal Medicine) Lafayette Dragon, MD (Inactive) as Consulting Physician (Gastroenterology) Megan Salon, MD as Consulting Physician (Gynecology) Melrose Nakayama, MD as Consulting Physician (Orthopedic Surgery) Deneise Lever, MD as Consulting Physician (Pulmonary Disease) [name], MD (Ophthalmology)  CHIEF COMPLAINTS/PURPOSE OF CONSULTATION:  Follow-up for continued evaluation and management of multiple myeloma  HISTORY OF PRESENTING ILLNESS:  Please see previous notes for details on initial presentation  Current Treatment: Carfilzomib + Revlimid maintenance.  INTERVAL HISTORY:   Faith Orr is a 81 y.o. female is here for continued valuation and management of her multiple myeloma. She notes no acute new symptoms since her last clinic visit.  She is tolerating the slightly lower dose of carfilzomib better with decreased fatigue. Labs done today discussed in detail with her. No other acute new treatment toxicities.  MEDICAL HISTORY:  Past Medical History:  Diagnosis Date   Allergy    seasonal   Asthma    DEPRESSION    DIABETES MELLITUS, TYPE II    Diverticulosis    HYPERLIPIDEMIA    Macular degeneration of left eye    mild, Dr.Hecker   Obesity, unspecified    Osteoarthritis of both knees    OSTEOPENIA    Osteopenia    URINARY INCONTINENCE     SURGICAL HISTORY: Past Surgical History:  Procedure Laterality Date   CATARACT EXTRACTION Left 05/24/2018   CESAREAN SECTION  01/1973   CYSTOSCOPY/URETEROSCOPY/HOLMIUM LASER/STENT PLACEMENT Right 09/20/2019   Procedure: CYSTOSCOPY/URETEROSCOPY/HOLMIUM LASER/STENT PLACEMENT;  Surgeon: Lucas Mallow, MD;  Location: [facility];  Service: Urology;  Laterality: Right;   FRACTURE SURGERY     IR IMAGING GUIDED PORT INSERTION  02/20/2019   left wrist surgery  2008    By Dr. Latanya Maudlin   right ankle  1994    SOCIAL HISTORY: Social History   Socioeconomic History   Marital status: Married    Spouse name: Not on file   Number of children: 1   Years of education: Not on file   Highest education level: Not on file  Occupational History    Employer: [employer]  Tobacco Use   Smoking status: Never   Smokeless tobacco: Never   Tobacco comments:    Lives with partner [name]) and son  Vaping Use   Vaping Use: Never used  Substance and Sexual Activity   Alcohol use: No    Alcohol/week: 0.0 standard drinks of alcohol   Drug use: No   Sexual activity: Never    Partners: Female    Birth control/protection: Post-menopausal    Comment: Lives with female partner (annette hicks) and 58 yo son  Other Topics Concern   Not on file  Social History Narrative   Not on file   Social Determinants of Health   Financial Resource Strain: Low Risk  (02/27/2022)   Overall Financial Resource Strain (CARDIA)    Difficulty of Paying Living Expenses: Not hard at all  Food Insecurity: No Food Insecurity (02/27/2022)   Hunger Vital Sign    Worried About Running Out of Food in the Last Year: Never true    [place] in the Last Year: Never true  Transportation Needs: No Transportation Needs (02/27/2022)   PRAPARE - Hydrologist (Medical): No    Lack of Transportation (Non-Medical): No  Physical Activity: Unknown (  02/27/2022)   Exercise Vital Sign    Days of Exercise per Week: Patient refused    Minutes of Exercise per Session: Patient refused  Stress: No Stress Concern Present (02/27/2022)   [place]    Feeling of Stress : Not at all  Social Connections: Unknown (02/27/2022)   Social Connection and Isolation Panel [NHANES]    Frequency of Communication with Friends and Family: Patient refused    Frequency of Social Gatherings with Friends and  Family: Patient refused    Attends Religious Services: Patient refused    Active Member of Clubs or Organizations: Patient refused    Attends Archivist Meetings: Patient refused    Marital Status: Living with partner  Intimate Partner Violence: Not At Risk (05/08/2021)   Humiliation, Afraid, Rape, and Kick questionnaire    Fear of Current or Ex-Partner: No    Emotionally Abused: No    Physically Abused: No    Sexually Abused: No    FAMILY HISTORY: Family History  Problem Relation Age of Onset   Diabetes Father    Hyperlipidemia Father    Heart disease Father    Cancer Father    Hypertension Father    Colon cancer Paternal Grandmother 23   Osteoporosis Mother    Protein S deficiency Mother    Hyperlipidemia Mother    Multiple sclerosis Daughter    Cancer Other        bladder   Breast cancer Neg Hx     ALLERGIES:  is allergic to levofloxacin, penicillins, aleve [naproxen sodium], and sulfonamide derivatives.  MEDICATIONS:  Current Outpatient Medications  Medication Sig Dispense Refill   acyclovir (ZOVIRAX) 400 MG tablet TAKE 1 TABLET(400 MG) BY MOUTH TWICE DAILY 60 tablet 5   Blood Glucose Monitoring Suppl (FREESTYLE FREEDOM LITE) W/DEVICE KIT Use to check blood sugars twice a day Dx 250.00 1 each 0   Calcium Carbonate-Vitamin D 600-400 MG-UNIT tablet Take 1 tablet by mouth 2 (two) times daily.     dexamethasone (DECADRON) 4 MG tablet Take 5 tablets (20 mg total) by mouth once a week. On D22 of each cycle of treatment 20 tablet 5   ELIQUIS 2.5 MG TABS tablet TAKE 1 TABLET(2.5 MG) BY MOUTH TWICE DAILY 60 tablet 2   fentaNYL (DURAGESIC) 12 MCG/HR Place 1 patch onto the skin every 3 (three) days. 10 patch 0   fluticasone (FLONASE) 50 MCG/ACT nasal spray Place 1 spray into both nostrils daily. 16 g 2   glucose blood (FREESTYLE LITE) test strip CHECK BLOOD SUGAR TWICE DAILY AS DIRECTED Dx 250.00 180 each 3   Lancets (FREESTYLE) lancets Use twice daily to check sugars.  100 each 11   lenalidomide (REVLIMID) 5 MG capsule TAKE 1 CAPSULE BY MOUTH DAILY  FOR 21 DAYS, THEN 7 DAYS OFF 21 capsule 0   lidocaine-prilocaine (EMLA) cream APPLY 1 APPLICATION TO THE AFFECTED AREA AS NEEDED. USE PRIOR TO PORT ACCESS 30 g 0   metFORMIN (GLUCOPHAGE-XR) 500 MG 24 hr tablet Take 3 tablets (1,500 mg total) by mouth daily with breakfast. TAKE 3 TABLETS(1500 MG) BY MOUTH DAILY WITH BREAKFAST Strength: 500 mg 180 tablet 1   Multiple Vitamins-Minerals (ICAPS) CAPS Take 1 capsule by mouth daily after breakfast.     ondansetron (ZOFRAN) 8 MG tablet Take 1 tablet (8 mg total) by mouth 2 (two) times daily as needed (Nausea or vomiting). 30 tablet 1   Oxycodone HCl 10 MG TABS Take 1  tablet (10 mg total) by mouth every 6 (six) hours as needed. 90 tablet 0   polyethylene glycol (MIRALAX / GLYCOLAX) packet Take 17 g by mouth daily after breakfast.     potassium chloride SA (KLOR-CON M) 20 MEQ tablet TAKE 2 TABLETS BY MOUTH TWICE DAILY 360 tablet 1   prochlorperazine (COMPAZINE) 10 MG tablet Take 1 tablet (10 mg total) by mouth every 6 (six) hours as needed (Nausea or vomiting). 30 tablet 1   senna-docusate (SENNA S) 8.6-50 MG tablet Take 2 tablets by mouth at bedtime. 60 tablet 2   sertraline (ZOLOFT) 100 MG tablet TAKE 1 TABLET(100 MG) BY MOUTH DAILY 90 tablet 0   simvastatin (ZOCOR) 20 MG tablet TAKE 1 TABLET BY MOUTH EVERY DAY AT 6 PM 90 tablet 3   Vitamin D, Ergocalciferol, (DRISDOL) 1.25 MG (50000 UNIT) CAPS capsule TAKE 1 CAPSULE BY MOUTH EVERY 7 DAYS 12 capsule 0   No current facility-administered medications for this visit.   Facility-Administered Medications Ordered in Other Visits  Medication Dose Route Frequency Provider Last Rate Last Admin   heparin lock flush 100 unit/mL  500 Units Intracatheter Once PRN Brunetta Genera, MD       sodium chloride flush (NS) 0.9 % injection 10 mL  10 mL Intracatheter PRN Brunetta Genera, MD   10 mL at 10/02/19 1524    REVIEW OF  SYSTEMS:   10 Point review of Systems was done is negative except as noted above.  PHYSICAL EXAMINATION: ECOG FS:2 - Symptomatic, <50% confined to bed  Vitals:   05/22/22 1322  BP: 121/70  Pulse: 75  Resp: 15  Temp: 97.9 F (36.6 C)  SpO2: 96%    Wt Readings from Last 3 Encounters:  05/22/22 149 lb 3.2 oz (67.7 kg)  05/07/22 151 lb 8 oz (68.7 kg)  04/23/22 148 lb 1.9 oz (67.2 kg)   Body mass index is 30.13 kg/m.    NAD GENERAL:alert, in no acute distress and comfortable SKIN: no acute rashes, no significant lesions EYES: conjunctiva are pink and non-injected, sclera anicteric OROPHARYNX: MMM, no exudates, no oropharyngeal erythema or ulceration NECK: supple, no JVD LYMPH:  no palpable lymphadenopathy in the cervical, axillary or inguinal regions LUNGS: clear to auscultation b/l with normal respiratory effort HEART: regular rate & rhythm ABDOMEN:  normoactive bowel sounds , non tender, not distended. Extremity: no pedal edema PSYCH: alert & oriented x 3 with fluent speech NEURO: no focal motor/sensory deficits  LABORATORY DATA:  I have reviewed the data as listed  .Marland Kitchen    Latest Ref Rng & Units 05/22/2022    1:02 PM 05/07/2022    1:50 PM 04/23/2022    1:45 PM  CBC  WBC 4.0 - 10.5 K/uL 3.5  4.5  3.4   Hemoglobin 12.0 - 15.0 g/dL 12.0  11.5  11.4   Hematocrit 36.0 - 46.0 % 35.8  34.1  33.1   Platelets 150 - 400 K/uL 119  170  134     . CBC    Component Value Date/Time   WBC 3.5 (L) 05/22/2022 1302   WBC 2.2 (L) 12/11/2021 2254   RBC 3.60 (L) 05/22/2022 1302   HGB 12.0 05/22/2022 1302   HCT 35.8 (L) 05/22/2022 1302   PLT 119 (L) 05/22/2022 1302   MCV 99.4 05/22/2022 1302   MCH 33.3 05/22/2022 1302   MCHC 33.5 05/22/2022 1302   RDW 13.8 05/22/2022 1302   LYMPHSABS 1.1 05/22/2022 1302   MONOABS 1.0 05/22/2022  1302   EOSABS 0.2 05/22/2022 1302   BASOSABS 0.0 05/22/2022 1302  .    Latest Ref Rng & Units 05/22/2022    1:02 PM 05/07/2022    1:50 PM 04/23/2022     1:45 PM  CMP  Glucose 70 - 99 mg/dL 152  164  170   BUN 8 - 23 mg/dL _0 Creatinine 0.44 - 1.00 mg/dL 1.03  1.12  1.29   Sodium 135 - 145 mmol/L 137  137  138   Potassium 3.5 - 5.1 mmol/L 4.1  3.9  4.3   Chloride 98 - 111 mmol/L 110  107  107   CO2 22 - 32 mmol/L _1 Calcium 8.9 - 10.3 mg/dL 9.8  10.0  9.6   Total Protein 6.5 - 8.1 g/dL 6.4  6.5  6.7   Total Bilirubin 0.3 - 1.2 mg/dL 0.7  0.4  0.6   Alkaline Phos 38 - 126 U/L 92  89  86   AST 15 - 41 U/L _2 ALT 0 - 44 U/L _3 09/18/2019 BM Bx Report ([id number])   09/18/2019 FISH Panel    05/30/2019 BM Bx   01/06/2019 BM Bx:     01/06/19 Cytogenetics:      05/30/19 BM Biopsy:   09/18/2019 FISH Panel    09/18/2019 BM Surgical Pathology ([id number])     RADIOGRAPHIC STUDIES: I have personally reviewed the radiological images as listed and agreed with the findings in the report. No results found.   ASSESSMENT & PLAN:   81 y.o. female with  1.  Nonsecretory multiple Myeloma, RISS Stage III  Labs upon initial presentation from 12/08/18, blood counts are normal including WBC at 7.1k, HGB at 13.1, and PLT at 245k. Calcium normal at 10.3. Creatinine normal at 0.63. M spike at 0.5g. 12/13/18 Bone Scan revealed Multifocal uptake throughout the skeleton, consistent with diffuse metastatic disease. Primary tumor is not specified. 2. Uptake in the proximal right femur, consistent with lytic lesions. 3. Uptake in the ribs bilaterally as described. 4. Lesions in the proximal left humerus. 5. Diffuse uptake throughout the skull consistent with metastatic disease. 6. Right paramedian uptake at the manubrium.  12/13/18 CT Right Femur revealed Numerous lytic lesions involving the right femur and a lytic lesion in the left inferior pubic ramus. Overall appearance is most concerning for multiple myeloma  12/27/18 Pretreatment 24hour UPEP observed an M spike at 24m, and showed  1962mtotal protein/day.  12/27/18 Pretreatment MMP revealed M Protein at 0.5g with IgG Lambda specificity. Kappa:Lambda light chain ratio at 0.13, with Lambda at 40.3. There is less abnormal protein and light chains than I would expect from 30% plasma cells, which suggests hypo-secretory or non-secretory neoplastic plasma cells. Will have an impact in assessing response. 01/05/19 PET/CT revealed Innumerable lytic lesions in the skeleton compatible with myeloma. Most of the larger lesions are hypermetabolic, for example including a left proximal humeral shaft lesion with maximum SUV of 8.1 and a 2.8 cm lesion in the left T9 vertebral body with maximum SUV 5.1. Most of the smaller lytic lesions, and some of the larger lesions, do not demonstrate accentuated metabolic activity. 2. 1.2 cm in short axis lymph node in the left parapharyngeal space is hypermetabolic with maximum SUV 11.8. I do not see a separate mass in the head and neck to give rise to  this hypermetabolic lymph node. 3. Mosaic attenuation in the lower lobes, nonspecific possibly from air trapping. 4.  Aortic Atherosclerosis 5. Heterogeneous activity in the liver, making it hard to exclude small liver lesions. Consider hepatic protocol MRI with and without contrast for definitive assessment. Nonobstructive right nephrolithiasis. Old granulomatous disease  01/06/19 Bone Marrow biopsy revealed interstitial increase in plasma cells (28% aspirate, 40% CD138 immunohistochemistry). Plasma cells negative for light chains consistent with a non or weakly secretory myeloma   01/06/19 Cytogenetics revealed 37% of cells with trisomy 11 or 11q deletion, and 40.5% of cells with 17p mutation  S/p 5 cycles of KRD treatment  05/31/19 BM Biopsy revealed mild atypical plasmacytosis at 5% with polytypic variation.   06/01/19 PET/CT revealed "Dominant lesion in the LEFT humerus is decreased significantly in metabolic activity. Additional hypermetabolic skeletal lytic  lesions have decreased in metabolic activity or similar to comparison exam (01/05/2019). No evidence of disease progression. 2. Multiple additional lytic lesions do not have metabolic activity and unchanged. 3. No new skeletal lesions are identified. No soft tissue plasmacytoma identified. 4. Nodule / node in the LEFT parapharyngeal space which is intensely hypermetabolic not changed from prior. 5. New hypermetabolic LEFT lower lobe pulmonary nodule is indeterminate. Recommend close attention on follow-up 6. New obstructive hydronephrosis of the RIGHT kidney related to RIGHT UPJ stone."  09/18/2019 BM Bx Report which revealed "Slightly hypercellular bone marrow for age with trilineage hematopoiesis and 1% plasma cells."  09/14/2019 PET/CT Whole Body Scan ([id number]) which revealed "1. There widespread tiny lytic lesions compatible with multiple myeloma. Index larger lesions are generally similar to the prior exam, with low-grade activity such as the left T9 vertebral body lesion with maximum SUV 4.5. Is mild increase in the activity associated with a mildly sclerotic left proximal humeral lesion, maximum SUV 4.8 (previously 3.5). 2. At the site of the prior left lower lobe nodule is currently more bandlike thickening, with maximum SUV only 1.9, probably benign, continued surveillance of this region suggested. 3. There several small but hypermetabolic lymph nodes. This includes a left parapharyngeal space node measuring 1.0 cm with maximum SUV 12.3 (stable); a left level IB lymph node measuring 0.5 cm with maximum SUV 4.8 (slightly larger than prior); and a left inguinal lymph node measuring 0.7 cm in short axis with maximum SUV 6.4 (previously 0.5 cm with maximum SUV 0.6). Significance of these lymph nodes uncertain, surveillance is recommended. 4. New 5 mm left lower lobe subpleural nodule on image 32/8, not appreciably hypermetabolic, surveillance suggested. 5. Focal subcutaneous stranding along the left  perineum measuring about 2.6 by 1.1 cm on image 221/4, maximum SUV 12.5. This was not present previously and is most likely inflammatory, although given the notable SUV, surveillance of this region is suggested. 6. Other imaging findings of potential clinical significance: Aortic Atherosclerosis (ICD10-I70.0). Coronary atherosclerosis. Old granulomatous disease. Mild right hydronephrosis due to a 7 mm right UPJ calculus. 2 mm right kidney upper pole nonobstructive renal calculus. Prominent stool throughout the colon favors constipation."  12/19/2019 Thoracic & Lumbar Spine MRI ([id number]) ([id number]) revealed "Suspected myeloma lesions at T9 and S1. No compression deformity or epidural disease.  01/18/2020 PET/CT ([id number]) which revealed "1. Stable lytic lesions throughout the skeleton. The larger lytic lesions which had mild metabolic activity on comparison exam now have background metabolic activity. No evidence of active myeloma. No evidence of progression multiple myeloma.  No plasmacytoma 3. Hypermetabolic nodules in the LEFT neck may be associated deep tissues of the LEFT parotid  gland. 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  01/17/19 MRI Liver revealed Several appreciable liver lesions all have benign imaging characteristics. No MRI findings of metastatic involvement of the liver. 2. Scattered bony lesions corresponding to the lytic lesions seen at PET-CT, compatible with active myeloma. 3. Aortic Atherosclerosis.  Mild cardiomegaly. 4. Diffuse hepatic steatosis.   3. Left lower lobe pulmonary nodule First seen on 06/01/19 PET/CT PET/CT 04/22/7124: No hypermetabolic mediastinal or hilar nodes. No suspicious pulmonary nodules on the CT scan.  4. 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 Has been stable on last couple of scans. Being managed conservatively as per patient's preference.  No symptoms from this currently.  5. Mild chronic kidney disease CMP done 04/09/2022 showed creatinine of 1.17 and GFR est of 48 consistent with mild chronic kidney disease.  PLAN: -Labs done today were discussed in detail with the patient and show stable CBC and CMP. -No infection issues.  No focal bone pains. -No notable toxicities from her current dose of carfilzomib and Revlimid. -We will continue Revlimid at 5 mg 3 weeks on 1 week off -We shall continue carfilzomib at 36 mg per metered squared every 2 weeks -Continue Revlimid 2.5 mg p.o. twice daily for VTE prophylaxis Continue current potassium replacement Continue Zometa every 4 weeks PET CT scan in the next few weeks.   FOLLOW UP: Please schedule next 6 doses of maintenance carfilzomib every 2 weeks with port flush and labs. MD visit in 8 weeks  The total time spent in the appointment was 30 minutes*.  All of the patient's questions were answered with apparent satisfaction. The patient knows to call the clinic with any problems, questions or concerns.   Sullivan Lone MD MS AAHIVMS [facility] Hematology/Oncology Physician [facility]  .*Total Encounter Time as defined by the Centers for Medicare and Medicaid Services includes, in addition to the face-to-face time of a patient visit (documented in the note above) non-face-to-face time: obtaining and reviewing outside history, ordering and reviewing medications, tests or procedures, care coordination (communications with other health care professionals or caregivers) and documentation in the medical record.

## 2022-06-03 ENCOUNTER — Telehealth: Payer: Self-pay | Admitting: Hematology

## 2022-06-03 NOTE — Telephone Encounter (Signed)
Left message with follow-up appointments per 6/9 los.

## 2022-06-04 ENCOUNTER — Inpatient Hospital Stay: Payer: Medicare PPO

## 2022-06-04 ENCOUNTER — Other Ambulatory Visit: Payer: Self-pay | Admitting: Hematology

## 2022-06-04 ENCOUNTER — Other Ambulatory Visit: Payer: Self-pay

## 2022-06-04 VITALS — BP 134/68 | HR 68 | Temp 97.7°F | Resp 17 | Wt 150.8 lb

## 2022-06-04 DIAGNOSIS — C9000 Multiple myeloma not having achieved remission: Secondary | ICD-10-CM | POA: Diagnosis not present

## 2022-06-04 DIAGNOSIS — Z5112 Encounter for antineoplastic immunotherapy: Secondary | ICD-10-CM | POA: Diagnosis not present

## 2022-06-04 DIAGNOSIS — Z7189 Other specified counseling: Secondary | ICD-10-CM

## 2022-06-04 DIAGNOSIS — Z95828 Presence of other vascular implants and grafts: Secondary | ICD-10-CM

## 2022-06-04 DIAGNOSIS — Z79899 Other long term (current) drug therapy: Secondary | ICD-10-CM | POA: Diagnosis not present

## 2022-06-04 LAB — CBC WITH DIFFERENTIAL (CANCER CENTER ONLY)
Abs Immature Granulocytes: 0.02 10*3/uL (ref 0.00–0.07)
Basophils Absolute: 0.1 10*3/uL (ref 0.0–0.1)
Basophils Relative: 2 %
Eosinophils Absolute: 0.1 10*3/uL (ref 0.0–0.5)
Eosinophils Relative: 2 %
HCT: 34.2 % — ABNORMAL LOW (ref 36.0–46.0)
Hemoglobin: 11.8 g/dL — ABNORMAL LOW (ref 12.0–15.0)
Immature Granulocytes: 1 %
Lymphocytes Relative: 27 %
Lymphs Abs: 1 10*3/uL (ref 0.7–4.0)
MCH: 34.3 pg — ABNORMAL HIGH (ref 26.0–34.0)
MCHC: 34.5 g/dL (ref 30.0–36.0)
MCV: 99.4 fL (ref 80.0–100.0)
Monocytes Absolute: 0.6 10*3/uL (ref 0.1–1.0)
Monocytes Relative: 15 %
Neutro Abs: 2 10*3/uL (ref 1.7–7.7)
Neutrophils Relative %: 53 %
Platelet Count: 172 10*3/uL (ref 150–400)
RBC: 3.44 MIL/uL — ABNORMAL LOW (ref 3.87–5.11)
RDW: 14 % (ref 11.5–15.5)
WBC Count: 3.8 10*3/uL — ABNORMAL LOW (ref 4.0–10.5)
nRBC: 0 % (ref 0.0–0.2)

## 2022-06-04 LAB — CMP (CANCER CENTER ONLY)
ALT: 11 U/L (ref 0–44)
AST: 12 U/L — ABNORMAL LOW (ref 15–41)
Albumin: 3.8 g/dL (ref 3.5–5.0)
Alkaline Phosphatase: 90 U/L (ref 38–126)
Anion gap: 5 (ref 5–15)
BUN: 12 mg/dL (ref 8–23)
CO2: 22 mmol/L (ref 22–32)
Calcium: 9.7 mg/dL (ref 8.9–10.3)
Chloride: 109 mmol/L (ref 98–111)
Creatinine: 0.95 mg/dL (ref 0.44–1.00)
GFR, Estimated: >60 mL/min (ref >60)
Glucose, Bld: 127 mg/dL — ABNORMAL HIGH (ref 70–99)
Potassium: 4.1 mmol/L (ref 3.5–5.1)
Sodium: 136 mmol/L (ref 135–145)
Total Bilirubin: 0.5 mg/dL (ref 0.3–1.2)
Total Protein: 6.5 g/dL (ref 6.5–8.1)

## 2022-06-04 MED ORDER — DEXAMETHASONE 4 MG PO TABS
12.0000 mg | ORAL_TABLET | Freq: Once | ORAL | Status: AC
  Administered 2022-06-04: 12 mg via ORAL
  Filled 2022-06-04 (×2): qty 3

## 2022-06-04 MED ORDER — SODIUM CHLORIDE 0.9 % IV SOLN: Freq: Once | INTRAVENOUS | Status: AC

## 2022-06-04 MED ORDER — FAMOTIDINE 20 MG PO TABS
20.0000 mg | ORAL_TABLET | Freq: Once | ORAL | Status: AC
  Administered 2022-06-04: 20 mg via ORAL
  Filled 2022-06-04 (×2): qty 1

## 2022-06-04 MED ORDER — DEXTROSE 5 % IV SOLN
33.0000 mg/m2 | Freq: Once | INTRAVENOUS | Status: AC
  Administered 2022-06-04: 60 mg via INTRAVENOUS
  Filled 2022-06-04: qty 30

## 2022-06-04 MED ORDER — SODIUM CHLORIDE 0.9% FLUSH
10.0000 mL | INTRAVENOUS | Status: DC | PRN
  Administered 2022-06-04: 10 mL

## 2022-06-04 MED ORDER — SODIUM CHLORIDE 0.9% FLUSH
10.0000 mL | Freq: Once | INTRAVENOUS | Status: AC
  Administered 2022-06-04: 10 mL

## 2022-06-04 MED ORDER — DIPHENHYDRAMINE HCL 25 MG PO CAPS
25.0000 mg | ORAL_CAPSULE | Freq: Once | ORAL | Status: AC
  Administered 2022-06-04: 25 mg via ORAL
  Filled 2022-06-04 (×2): qty 1

## 2022-06-04 MED ORDER — ACETAMINOPHEN 325 MG PO TABS
650.0000 mg | ORAL_TABLET | Freq: Once | ORAL | Status: AC
  Administered 2022-06-04: 650 mg via ORAL
  Filled 2022-06-04 (×2): qty 2

## 2022-06-04 MED ORDER — HEPARIN SOD (PORK) LOCK FLUSH 100 UNIT/ML IV SOLN
500.0000 [IU] | Freq: Once | INTRAVENOUS | Status: AC | PRN
  Administered 2022-06-04: 500 [IU]

## 2022-06-04 MED ORDER — PROCHLORPERAZINE MALEATE 10 MG PO TABS
10.0000 mg | ORAL_TABLET | Freq: Once | ORAL | Status: AC
  Administered 2022-06-04: 10 mg via ORAL
  Filled 2022-06-04 (×2): qty 1

## 2022-06-04 NOTE — Patient Instructions (Signed)
[facility]  Discharge Instructions: Thank you for choosing [facility] to provide your oncology and hematology care.   If you have a lab appointment with the [facility], please go directly to the [facility] and check in at the registration area.   Wear comfortable clothing and clothing appropriate for easy access to any Portacath or PICC line.   We strive to give you quality time with your provider. You may need to reschedule your appointment if you arrive late (15 or more minutes).  Arriving late affects you and other patients whose appointments are after yours.  Also, if you miss three or more appointments without notifying the office, you may be dismissed from the clinic at the provider's discretion.      For prescription refill requests, have your pharmacy contact our office and allow 72 hours for refills to be completed.    Today you received the following chemotherapy and/or immunotherapy agents: Carfilzomib.       To help prevent nausea and vomiting after your treatment, we encourage you to take your nausea medication as directed.  BELOW ARE SYMPTOMS THAT SHOULD BE REPORTED IMMEDIATELY: *FEVER GREATER THAN 100.4 F (38 C) OR HIGHER *CHILLS OR SWEATING *NAUSEA AND VOMITING THAT IS NOT CONTROLLED WITH YOUR NAUSEA MEDICATION *UNUSUAL SHORTNESS OF BREATH *UNUSUAL BRUISING OR BLEEDING *URINARY PROBLEMS (pain or burning when urinating, or frequent urination) *BOWEL PROBLEMS (unusual diarrhea, constipation, pain near the anus) TENDERNESS IN MOUTH AND THROAT WITH OR WITHOUT PRESENCE OF ULCERS (sore throat, sores in mouth, or a toothache) UNUSUAL RASH, SWELLING OR PAIN  UNUSUAL VAGINAL DISCHARGE OR ITCHING   Items with * indicate a potential emergency and should be followed up as soon as possible or go to the Emergency Department if any problems should occur.  Please show the CHEMOTHERAPY ALERT CARD or IMMUNOTHERAPY ALERT CARD at check-in  to the Emergency Department and triage nurse.  Should you have questions after your visit or need to cancel or reschedule your appointment, please contact [facility]  Dept: [phone]  and follow the prompts.  Office hours are 8:00 a.m. to 4:30 p.m. Monday - Friday. Please note that voicemails left after 4:00 p.m. may not be returned until the following business day.  We are closed weekends and major holidays. You have access to a nurse at all times for urgent questions. Please call the main number to the clinic Dept: [phone] and follow the prompts.   For any non-urgent questions, you may also contact your provider using MyChart. We now offer e-Visits for anyone 49 and older to request care online for non-urgent symptoms. For details visit [email or web address].   Also download the MyChart app! Go to the app store, search "MyChart", open the app, select [facility], and log in with your MyChart username and password.  Masks are optional in the cancer centers. If you would like for your care team to wear a mask while they are taking care of you, please let them know. For doctor visits, patients may have with them one support person who is at least 79 years old. At this time, visitors are not allowed in the infusion area.

## 2022-06-05 ENCOUNTER — Other Ambulatory Visit: Payer: Self-pay

## 2022-06-05 ENCOUNTER — Encounter: Payer: Self-pay | Admitting: Hematology

## 2022-06-10 ENCOUNTER — Encounter (HOSPITAL_COMMUNITY)
Admission: RE | Admit: 2022-06-10 | Discharge: 2022-06-10 | Disposition: A | Discharge status: Home / Self Care | Payer: Medicare PPO | Source: Ambulatory Visit | Attending: Hematology | Admitting: Hematology

## 2022-06-10 DIAGNOSIS — C9000 Multiple myeloma not having achieved remission: Secondary | ICD-10-CM | POA: Diagnosis not present

## 2022-06-10 LAB — GLUCOSE, CAPILLARY: Glucose-Capillary: 134 mg/dL — ABNORMAL HIGH (ref 70–99)

## 2022-06-10 MED ORDER — FLUDEOXYGLUCOSE F - 18 (FDG) INJECTION
7.7500 | Freq: Once | INTRAVENOUS | Status: AC
  Administered 2022-06-10: 7.75 via INTRAVENOUS

## 2022-06-14 ENCOUNTER — Other Ambulatory Visit (HOSPITAL_COMMUNITY): Payer: Self-pay | Admitting: Hematology

## 2022-06-14 DIAGNOSIS — C7951 Secondary malignant neoplasm of bone: Secondary | ICD-10-CM

## 2022-06-14 DIAGNOSIS — C9000 Multiple myeloma not having achieved remission: Secondary | ICD-10-CM

## 2022-06-15 ENCOUNTER — Encounter: Payer: Self-pay | Admitting: Hematology

## 2022-06-16 ENCOUNTER — Other Ambulatory Visit: Payer: Self-pay | Admitting: Hematology

## 2022-06-16 DIAGNOSIS — C9000 Multiple myeloma not having achieved remission: Secondary | ICD-10-CM

## 2022-06-17 ENCOUNTER — Other Ambulatory Visit: Payer: Self-pay

## 2022-06-17 ENCOUNTER — Encounter: Payer: Self-pay | Admitting: Hematology

## 2022-06-18 ENCOUNTER — Inpatient Hospital Stay: Payer: Medicare PPO | Attending: Hematology

## 2022-06-18 ENCOUNTER — Inpatient Hospital Stay: Payer: Medicare PPO

## 2022-06-18 ENCOUNTER — Other Ambulatory Visit: Payer: Self-pay

## 2022-06-18 VITALS — BP 132/71 | HR 74 | Temp 98.0°F | Resp 16 | Ht 59.0 in | Wt 148.2 lb

## 2022-06-18 DIAGNOSIS — C9000 Multiple myeloma not having achieved remission: Secondary | ICD-10-CM | POA: Insufficient documentation

## 2022-06-18 DIAGNOSIS — Z7189 Other specified counseling: Secondary | ICD-10-CM

## 2022-06-18 DIAGNOSIS — Z79899 Other long term (current) drug therapy: Secondary | ICD-10-CM | POA: Diagnosis not present

## 2022-06-18 DIAGNOSIS — Z5112 Encounter for antineoplastic immunotherapy: Secondary | ICD-10-CM | POA: Insufficient documentation

## 2022-06-18 DIAGNOSIS — Z95828 Presence of other vascular implants and grafts: Secondary | ICD-10-CM

## 2022-06-18 LAB — CMP (CANCER CENTER ONLY)
ALT: 12 U/L (ref 0–44)
AST: 13 U/L — ABNORMAL LOW (ref 15–41)
Albumin: 4 g/dL (ref 3.5–5.0)
Alkaline Phosphatase: 90 U/L (ref 38–126)
Anion gap: 11 (ref 5–15)
BUN: 19 mg/dL (ref 8–23)
CO2: 18 mmol/L — ABNORMAL LOW (ref 22–32)
Calcium: 9.4 mg/dL (ref 8.9–10.3)
Chloride: 108 mmol/L (ref 98–111)
Creatinine: 1.13 mg/dL — ABNORMAL HIGH (ref 0.44–1.00)
GFR, Estimated: 50 mL/min — ABNORMAL LOW (ref >60)
Glucose, Bld: 241 mg/dL — ABNORMAL HIGH (ref 70–99)
Potassium: 2.7 mmol/L — CL (ref 3.5–5.1)
Sodium: 137 mmol/L (ref 135–145)
Total Bilirubin: 0.6 mg/dL (ref 0.3–1.2)
Total Protein: 6.7 g/dL (ref 6.5–8.1)

## 2022-06-18 LAB — CBC WITH DIFFERENTIAL (CANCER CENTER ONLY)
Abs Immature Granulocytes: 0.02 10*3/uL (ref 0.00–0.07)
Basophils Absolute: 0.1 10*3/uL (ref 0.0–0.1)
Basophils Relative: 1 %
Eosinophils Absolute: 0.2 10*3/uL (ref 0.0–0.5)
Eosinophils Relative: 5 %
HCT: 35.4 % — ABNORMAL LOW (ref 36.0–46.0)
Hemoglobin: 12.6 g/dL (ref 12.0–15.0)
Immature Granulocytes: 1 %
Lymphocytes Relative: 25 %
Lymphs Abs: 0.9 10*3/uL (ref 0.7–4.0)
MCH: 34.4 pg — ABNORMAL HIGH (ref 26.0–34.0)
MCHC: 35.6 g/dL (ref 30.0–36.0)
MCV: 96.7 fL (ref 80.0–100.0)
Monocytes Absolute: 0.7 10*3/uL (ref 0.1–1.0)
Monocytes Relative: 20 %
Neutro Abs: 1.9 10*3/uL (ref 1.7–7.7)
Neutrophils Relative %: 48 %
Platelet Count: 119 10*3/uL — ABNORMAL LOW (ref 150–400)
RBC: 3.66 MIL/uL — ABNORMAL LOW (ref 3.87–5.11)
RDW: 14.6 % (ref 11.5–15.5)
WBC Count: 3.8 10*3/uL — ABNORMAL LOW (ref 4.0–10.5)
nRBC: 0 % (ref 0.0–0.2)

## 2022-06-18 MED ORDER — DEXAMETHASONE 4 MG PO TABS
12.0000 mg | ORAL_TABLET | Freq: Once | ORAL | Status: AC
  Administered 2022-06-18: 12 mg via ORAL
  Filled 2022-06-18: qty 3

## 2022-06-18 MED ORDER — DIPHENHYDRAMINE HCL 25 MG PO CAPS
25.0000 mg | ORAL_CAPSULE | Freq: Once | ORAL | Status: AC
  Administered 2022-06-18: 25 mg via ORAL
  Filled 2022-06-18: qty 1

## 2022-06-18 MED ORDER — ACETAMINOPHEN 325 MG PO TABS
650.0000 mg | ORAL_TABLET | Freq: Once | ORAL | Status: AC
  Administered 2022-06-18: 650 mg via ORAL
  Filled 2022-06-18: qty 2

## 2022-06-18 MED ORDER — SODIUM CHLORIDE 0.9 % IV SOLN: Freq: Once | INTRAVENOUS | Status: AC

## 2022-06-18 MED ORDER — HEPARIN SOD (PORK) LOCK FLUSH 100 UNIT/ML IV SOLN
500.0000 [IU] | Freq: Once | INTRAVENOUS | Status: AC | PRN
  Administered 2022-06-18: 500 [IU]

## 2022-06-18 MED ORDER — FAMOTIDINE 20 MG PO TABS
20.0000 mg | ORAL_TABLET | Freq: Once | ORAL | Status: AC
  Administered 2022-06-18: 20 mg via ORAL
  Filled 2022-06-18: qty 1

## 2022-06-18 MED ORDER — ZOLEDRONIC ACID 4 MG/100ML IV SOLN
4.0000 mg | Freq: Once | INTRAVENOUS | Status: AC
  Administered 2022-06-18: 4 mg via INTRAVENOUS
  Filled 2022-06-18: qty 100

## 2022-06-18 MED ORDER — POTASSIUM CHLORIDE 10 MEQ/100ML IV SOLN
10.0000 meq | Freq: Once | INTRAVENOUS | Status: AC
  Administered 2022-06-18: 10 meq via INTRAVENOUS
  Filled 2022-06-18: qty 100

## 2022-06-18 MED ORDER — SODIUM CHLORIDE 0.9% FLUSH
10.0000 mL | INTRAVENOUS | Status: DC | PRN
  Administered 2022-06-18: 10 mL

## 2022-06-18 MED ORDER — DEXTROSE 5 % IV SOLN
33.0000 mg/m2 | Freq: Once | INTRAVENOUS | Status: AC
  Administered 2022-06-18: 60 mg via INTRAVENOUS
  Filled 2022-06-18: qty 30

## 2022-06-18 MED ORDER — SODIUM CHLORIDE 0.9% FLUSH
10.0000 mL | Freq: Once | INTRAVENOUS | Status: AC
  Administered 2022-06-18: 10 mL

## 2022-06-18 MED ORDER — PROCHLORPERAZINE MALEATE 10 MG PO TABS
10.0000 mg | ORAL_TABLET | Freq: Once | ORAL | Status: AC
  Administered 2022-06-18: 10 mg via ORAL
  Filled 2022-06-18: qty 1

## 2022-06-18 NOTE — Progress Notes (Signed)
CRITICAL VALUE STICKER K 2.7 CRITICAL VALUE:  DATE & TIME NOTIFIED: 06/18/22 2:55   MD NOTIFIED: Dr Lorenso Courier  TIME OF NOTIFICATION:3:10

## 2022-06-18 NOTE — Patient Instructions (Signed)
[facility]  Discharge Instructions: Thank you for choosing [facility] to provide your oncology and hematology care.   If you have a lab appointment with the [facility], please go directly to the [facility] and check in at the registration area.   Wear comfortable clothing and clothing appropriate for easy access to any Portacath or PICC line.   We strive to give you quality time with your provider. You may need to reschedule your appointment if you arrive late (15 or more minutes).  Arriving late affects you and other patients whose appointments are after yours.  Also, if you miss three or more appointments without notifying the office, you may be dismissed from the clinic at the provider's discretion.      For prescription refill requests, have your pharmacy contact our office and allow 72 hours for refills to be completed.    Today you received the following chemotherapy and/or immunotherapy agent: Carfilzomib (Kyprolis)     To help prevent nausea and vomiting after your treatment, we encourage you to take your nausea medication as directed.  BELOW ARE SYMPTOMS THAT SHOULD BE REPORTED IMMEDIATELY: *FEVER GREATER THAN 100.4 F (38 C) OR HIGHER *CHILLS OR SWEATING *NAUSEA AND VOMITING THAT IS NOT CONTROLLED WITH YOUR NAUSEA MEDICATION *UNUSUAL SHORTNESS OF BREATH *UNUSUAL BRUISING OR BLEEDING *URINARY PROBLEMS (pain or burning when urinating, or frequent urination) *BOWEL PROBLEMS (unusual diarrhea, constipation, pain near the anus) TENDERNESS IN MOUTH AND THROAT WITH OR WITHOUT PRESENCE OF ULCERS (sore throat, sores in mouth, or a toothache) UNUSUAL RASH, SWELLING OR PAIN  UNUSUAL VAGINAL DISCHARGE OR ITCHING   Items with * indicate a potential emergency and should be followed up as soon as possible or go to the Emergency Department if any problems should occur.  Please show the CHEMOTHERAPY ALERT CARD or IMMUNOTHERAPY ALERT CARD at  check-in to the Emergency Department and triage nurse.  Should you have questions after your visit or need to cancel or reschedule your appointment, please contact [facility]  Dept: [phone]  and follow the prompts.  Office hours are 8:00 a.m. to 4:30 p.m. Monday - Friday. Please note that voicemails left after 4:00 p.m. may not be returned until the following business day.  We are closed weekends and major holidays. You have access to a nurse at all times for urgent questions. Please call the main number to the clinic Dept: [phone] and follow the prompts.   For any non-urgent questions, you may also contact your provider using MyChart. We now offer e-Visits for anyone 63 and older to request care online for non-urgent symptoms. For details visit [email or web address].   Also download the MyChart app! Go to the app store, search "MyChart", open the app, select [facility], and log in with your MyChart username and password.  Masks are optional in the cancer centers. If you would like for your care team to wear a mask while they are taking care of you, please let them know. For doctor visits, patients may have with them one support person who is at least 79 years old. At this time, visitors are not allowed in the infusion area. Carfilzomib injection What is this medication? CARFILZOMIB (kar FILZ oh mib) targets a specific protein within cancer cells and stops the cancer cells from growing. It is used to treat multiple myeloma. This medicine may be used for other purposes; ask your health care provider or pharmacist if you have questions.  COMMON BRAND NAME(S): KYPROLIS What should I tell my care team before I take this medication? They need to know if you have any of these conditions: heart disease history of blood clots irregular heartbeat kidney disease liver disease lung or breathing disease an unusual or allergic reaction to carfilzomib, or other  medicines, foods, dyes, or preservatives pregnant or trying to get pregnant breast-feeding How should I use this medication? This medicine is for injection or infusion into a vein. It is given by a health care professional in a hospital or clinic setting. Talk to your pediatrician regarding the use of this medicine in children. Special care may be needed. Overdosage: If you think you have taken too much of this medicine contact a poison control center or emergency room at once. NOTE: This medicine is only for you. Do not share this medicine with others. What if I miss a dose? It is important not to miss your dose. Call your doctor or health care professional if you are unable to keep an appointment. What may interact with this medication? Interactions are not expected. This list may not describe all possible interactions. Give your health care provider a list of all the medicines, herbs, non-prescription drugs, or dietary supplements you use. Also tell them if you smoke, drink alcohol, or use illegal drugs. Some items may interact with your medicine. What should I watch for while using this medication? Your condition will be monitored while you are receiving this medicine. You may need blood work done while you are taking this medicine. Do not become pregnant while taking this medicine or for 6 months after stopping it. Women should inform their health care provider if they wish to become pregnant or think they might be pregnant. Men should not father a child while taking this medicine and for 3 months after stopping it. There is a potential for serious side effects to an unborn child. Talk to your health care provider for more information. Do not breast-feed an infant while taking this medicine or for 2 weeks after stopping it. Check with your health care provider if you have severe diarrhea, nausea, and vomiting, or if you sweat a lot. The loss of too much body fluid may make it dangerous for you  to take this medicine. You may get drowsy or dizzy. Do not drive, use machinery, or do anything that needs mental alertness until you know how this medicine affects you. Do not stand up or sit up quickly, especially if you are an older patient. This reduces the risk of dizzy or fainting spells. What side effects may I notice from receiving this medication? Side effects that you should report to your doctor or health care professional as soon as possible: allergic reactions like skin rash, itching or hives, swelling of the face, lips, or tongue confusion dizziness feeling faint or lightheaded fever or chills palpitations seizures signs and symptoms of bleeding such as bloody or black, tarry stools; red or dark-brown urine; spitting up blood or brown material that looks like coffee grounds; red spots on the skin; unusual bruising or bleeding including from the eye, gums, or nose signs and symptoms of a blood clot such as breathing problems; changes in vision; chest pain; severe, sudden headache; pain, swelling, warmth in the leg; trouble speaking; sudden numbness or weakness of the face, arm or leg signs and symptoms of kidney injury like trouble passing urine or change in the amount of urine signs and symptoms of liver injury like dark yellow  or brown urine; general ill feeling or flu-like symptoms; light-colored stools; loss of appetite; nausea; right upper belly pain; unusually weak or tired; yellowing of the eyes or skin Side effects that usually do not require medical attention (report to your doctor or health care professional if they continue or are bothersome): back pain cough diarrhea headache muscle cramps trouble sleeping vomiting This list may not describe all possible side effects. Call your doctor for medical advice about side effects. You may report side effects to FDA at 1-800-FDA-1088. Where should I keep my medication? This drug is given in a hospital or clinic and will not  be stored at home. NOTE: This sheet is a summary. It may not cover all possible information. If you have questions about this medicine, talk to your doctor, pharmacist, or health care provider.  2023 Elsevier/Gold Standard (2021-01-09 00:00:00)  Zoledronic Acid Injection (Cancer) What is this medication? ZOLEDRONIC ACID (ZOE le dron ik AS id) treats high calcium levels in the blood caused by cancer. It may also be used with chemotherapy to treat weakened bones caused by cancer. It works by slowing down the release of calcium from bones. This lowers calcium levels in your blood. It also makes your bones stronger and less likely to break (fracture). It belongs to a group of medications called bisphosphonates. This medicine may be used for other purposes; ask your health care provider or pharmacist if you have questions. COMMON BRAND NAME(S): Zometa, Zometa Powder What should I tell my care team before I take this medication? They need to know if you have any of these conditions: Dehydration Dental disease Kidney disease Liver disease Low levels of calcium in the blood Lung or breathing disease, such as asthma Receiving steroids, such as dexamethasone or prednisone An unusual or allergic reaction to zoledronic acid, other medications, foods, dyes, or preservatives Pregnant or trying to get pregnant Breast-feeding How should I use this medication? This medication is injected into a vein. It is given by your care team in a hospital or clinic setting. Talk to your care team about the use of this medication in children. Special care may be needed. Overdosage: If you think you have taken too much of this medicine contact a poison control center or emergency room at once. NOTE: This medicine is only for you. Do not share this medicine with others. What if I miss a dose? Keep appointments for follow-up doses. It is important not to miss your dose. Call your care team if you are unable to keep an  appointment. What may interact with this medication? Certain antibiotics given by injection Diuretics, such as bumetanide, furosemide NSAIDs, medications for pain and inflammation, such as ibuprofen or naproxen Teriparatide Thalidomide This list may not describe all possible interactions. Give your health care provider a list of all the medicines, herbs, non-prescription drugs, or dietary supplements you use. Also tell them if you smoke, drink alcohol, or use illegal drugs. Some items may interact with your medicine. What should I watch for while using this medication? Visit your care team for regular checks on your progress. It may be some time before you see the benefit from this medication. Some people who take this medication have severe bone, joint, or muscle pain. This medication may also increase your risk for jaw problems or a broken thigh bone. Tell your care team right away if you have severe pain in your jaw, bones, joints, or muscles. Tell you care team if you have any pain that does  not go away or that gets worse. Tell your dentist and dental surgeon that you are taking this medication. You should not have major dental surgery while on this medication. See your dentist to have a dental exam and fix any dental problems before starting this medication. Take good care of your teeth while on this medication. Make sure you see your dentist for regular follow-up appointments. You should make sure you get enough calcium and vitamin D while you are taking this medication. Discuss the foods you eat and the vitamins you take with your care team. Check with your care team if you have severe diarrhea, nausea, and vomiting, or if you sweat a lot. The loss of too much body fluid may make it dangerous for you to take this medication. You may need bloodwork while taking this medication. Talk to your care team if you wish to become pregnant or think you might be pregnant. This medication can cause serious  birth defects. What side effects may I notice from receiving this medication? Side effects that you should report to your care team as soon as possible: Allergic reactions--skin rash, itching, hives, swelling of the face, lips, tongue, or throat Kidney injury--decrease in the amount of urine, swelling of the ankles, hands, or feet Low calcium level--muscle pain or cramps, confusion, tingling, or numbness in the hands or feet Osteonecrosis of the jaw--pain, swelling, or redness in the mouth, numbness of the jaw, poor healing after dental work, unusual discharge from the mouth, visible bones in the mouth Severe bone, joint, or muscle pain Side effects that usually do not require medical attention (report to your care team if they continue or are bothersome): Constipation Fatigue Fever Loss of appetite Nausea Stomach pain This list may not describe all possible side effects. Call your doctor for medical advice about side effects. You may report side effects to FDA at 1-800-FDA-1088. Where should I keep my medication? This medication is given in a hospital or clinic. It will not be stored at home. NOTE: This sheet is a summary. It may not cover all possible information. If you have questions about this medicine, talk to your doctor, pharmacist, or health care provider.  2023 Elsevier/Gold Standard (2022-01-23 00:00:00)  Potassium Chloride Injection What is this medication? POTASSIUM CHLORIDE (poe TASS i um KLOOR ide) prevents and treats low levels of potassium in your body. Potassium plays an important role in maintaining the health of your kidneys, heart, muscles, and nervous system. This medicine may be used for other purposes; ask your health care provider or pharmacist if you have questions. COMMON BRAND NAME(S): PROAMP What should I tell my care team before I take this medication? They need to know if you have any of these conditions: Addison disease Dehydration Diabetes (high blood  sugar) Heart disease High levels of potassium in the blood Irregular heartbeat or rhythm Kidney disease Large areas of burned skin An unusual or allergic reaction to potassium, other medications, foods, dyes, or preservatives Pregnant or trying to get pregnant Breast-feeding How should I use this medication? This medication is injected into a vein. It is given in a hospital or clinic setting. Talk to your care team about the use of this medication in children. Special care may be needed. Overdosage: If you think you have taken too much of this medicine contact a poison control center or emergency room at once. NOTE: This medicine is only for you. Do not share this medicine with others. What if I miss a dose?  This does not apply. This medication is not for regular use. What may interact with this medication? Do not take this medication with any of the following: Certain diuretics such as spironolactone, triamterene Eplerenone Sodium polystyrene sulfonate This medication may also interact with the following: Certain medications for blood pressure or heart disease like lisinopril, losartan, quinapril, valsartan Medications that lower your chance of fighting infection such as cyclosporine, tacrolimus NSAIDs, medications for pain and inflammation, like ibuprofen or naproxen Other potassium supplements Salt substitutes This list may not describe all possible interactions. Give your health care provider a list of all the medicines, herbs, non-prescription drugs, or dietary supplements you use. Also tell them if you smoke, drink alcohol, or use illegal drugs. Some items may interact with your medicine. What should I watch for while using this medication? Visit your care team for regular checks on your progress. Tell your care team if your symptoms do not start to get better or if they get worse. You may need blood work while you are taking this medication. Avoid salt substitutes unless you are  told otherwise by your care team. What side effects may I notice from receiving this medication? Side effects that you should report to your care team as soon as possible: Allergic reactions--skin rash, itching, hives, swelling of the face, lips, tongue, or throat High potassium level--muscle weakness, fast or irregular heartbeat Side effects that usually do not require medical attention (report to your care team if they continue or are bothersome): Diarrhea Nausea Stomach pain Vomiting This list may not describe all possible side effects. Call your doctor for medical advice about side effects. You may report side effects to FDA at 1-800-FDA-1088. Where should I keep my medication? This medication is given in a hospital or clinic. It will not be stored at home. NOTE: This sheet is a summary. It may not cover all possible information. If you have questions about this medicine, talk to your doctor, pharmacist, or health care provider.  2023 Elsevier/Gold Standard (2021-03-18 00:00:00) Hypokalemia Hypokalemia means that the amount of potassium in the blood is lower than normal. Potassium is a mineral (electrolyte) that helps regulate the amount of fluid in the body. It also stimulates muscle tightening (contraction) and helps nerves work properly. Normally, most of the body's potassium is inside cells, and only a very small amount is in the blood. Because the amount in the blood is so small, minor changes to potassium levels in the blood can be life-threatening. What are the causes? This condition may be caused by: Antibiotic medicine. Diarrhea or vomiting. Taking too much of a medicine that helps you have a bowel movement (laxative) can cause diarrhea and lead to hypokalemia. Chronic kidney disease (CKD). Medicines that help the body get rid of excess fluid (diuretics). Eating disorders, such as anorexia or bulimia. Low magnesium levels in the body. Sweating a lot. What are the signs or  symptoms? Symptoms of this condition include: Weakness. Constipation. Fatigue. Muscle cramps. Mental confusion. Skipped heartbeats or irregular heartbeat (palpitations). Tingling or numbness. How is this diagnosed? This condition is diagnosed with a blood test. How is this treated? This condition may be treated by: Taking potassium supplements. Adjusting the medicines that you take. Eating more foods that contain a lot of potassium. If your potassium level is very low, you may need to get potassium through an IV and be monitored in the hospital. Follow these instructions at home: Eating and drinking  Eat a healthy diet. A healthy diet includes  fresh fruits and vegetables, whole grains, healthy fats, and lean proteins. If told, eat more foods that contain a lot of potassium. These include: Nuts, such as peanuts and pistachios. Seeds, such as sunflower seeds and pumpkin seeds. Peas, lentils, and lima beans. Whole grain and bran cereals and breads. Fresh fruits and vegetables, such as apricots, avocado, bananas, cantaloupe, kiwi, oranges, tomatoes, asparagus, and potatoes. Juices, such as orange, tomato, and prune. Lean meats, including fish. Milk and milk products, such as yogurt. General instructions Take over-the-counter and prescription medicines only as told by your health care provider. This includes vitamins, natural food products, and supplements. Keep all follow-up visits. This is important. Contact a health care provider if: You have weakness that gets worse. You feel your heart pounding or racing. You vomit. You have diarrhea. You have diabetes and you have trouble keeping your blood sugar in your target range. Get help right away if: You have chest pain. You have shortness of breath. You have vomiting or diarrhea that lasts for more than 2 days. You faint. These symptoms may be an emergency. Get help right away. Call 911. Do not wait to see if the symptoms will  go away. Do not drive yourself to the hospital. Summary Hypokalemia means that the amount of potassium in the blood is lower than normal. This condition is diagnosed with a blood test. Hypokalemia may be treated by taking potassium supplements, adjusting the medicines that you take, or eating more foods that are high in potassium. If your potassium level is very low, you may need to get potassium through an IV and be monitored in the hospital. This information is not intended to replace advice given to you by your health care provider. Make sure you discuss any questions you have with your health care provider. Document Revised: 08/14/2021 Document Reviewed: 08/14/2021 Elsevier Patient Education  [place].

## 2022-06-19 ENCOUNTER — Encounter: Payer: Self-pay | Admitting: Internal Medicine

## 2022-06-19 ENCOUNTER — Other Ambulatory Visit: Payer: Self-pay | Admitting: Hematology and Oncology

## 2022-06-19 ENCOUNTER — Encounter: Payer: Self-pay | Admitting: Hematology

## 2022-06-19 DIAGNOSIS — C9000 Multiple myeloma not having achieved remission: Secondary | ICD-10-CM

## 2022-06-19 MED ORDER — FENTANYL 12 MCG/HR TD PT72: 1.0000 | MEDICATED_PATCH | TRANSDERMAL | 0 refills | Status: DC

## 2022-06-22 IMAGING — CT CT ANGIO CHEST
2 of 7 series · 17 of 46 positions shown · IV contrast (APPLIED)
Comparison: PET CT 01/18/2020

CLINICAL DATA: PE suspected, low/intermediate prob, positive
D-dimer Patient with myeloma on Revlimid with left posterior chest
pain with elevated d dimer

EXAM:
CT ANGIOGRAPHY CHEST WITH CONTRAST
TECHNIQUE: Multidetector CT imaging of the chest was performed using the
standard protocol during bolus administration of intravenous
contrast. Multiplanar CT image reconstructions and MIPs were
obtained to evaluate the vascular anatomy.
CONTRAST:  75mL OMNIPAQUE IOHEXOL 350 MG/ML SOLN

[Series 7: thins · axial · 0.77mm/px · z∈[+1242,+1431]mm · 14 of 303 slices shown]
[im 17/303  lung]
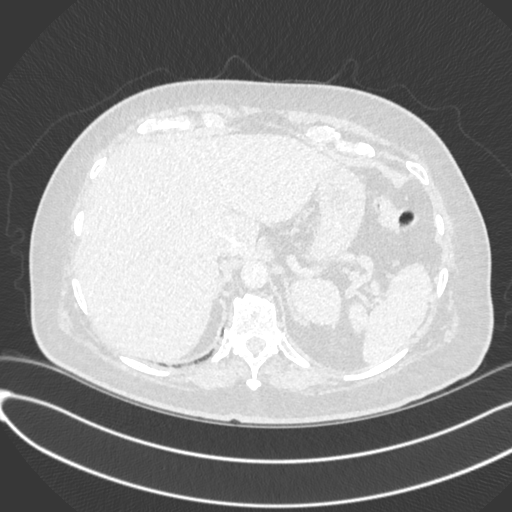
[im 34/303  soft-tissue]
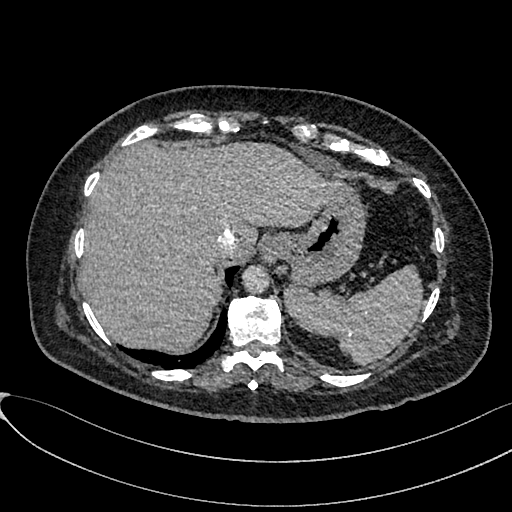
[im 68/303  lung]
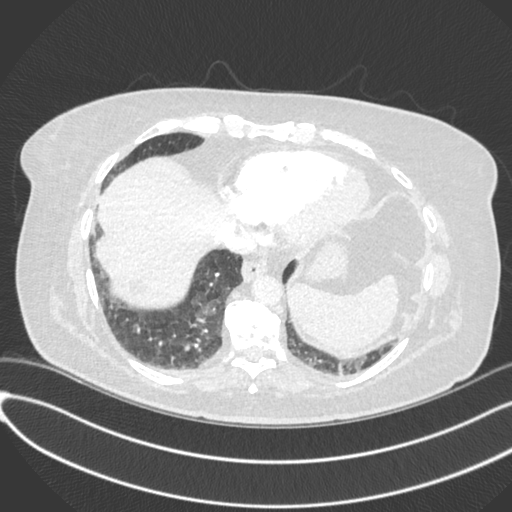
[im 84/303  soft-tissue]
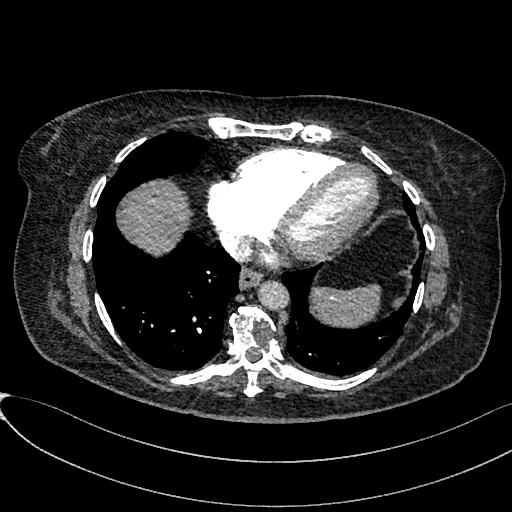
[im 101/303  lung]
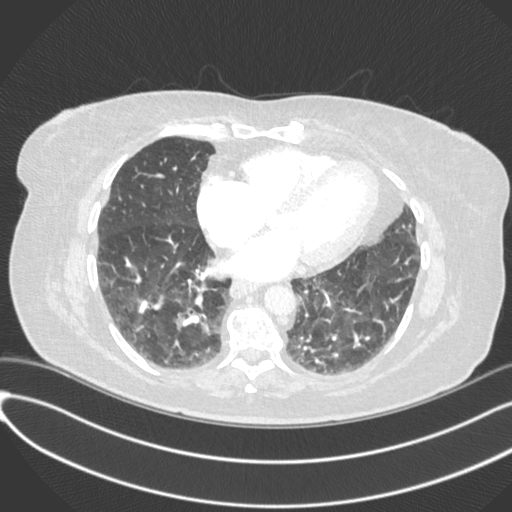
[im 118/303  soft-tissue]
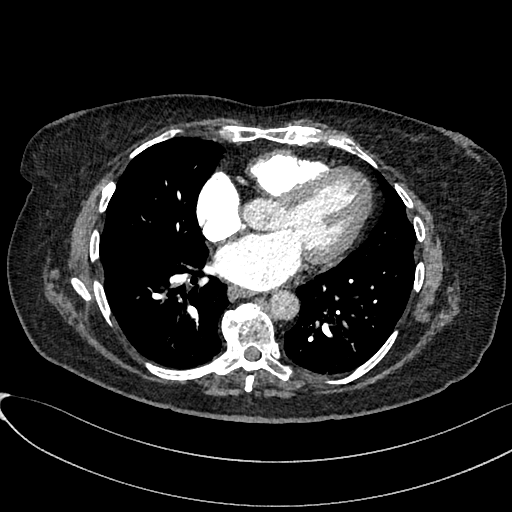
[im 135/303  lung]
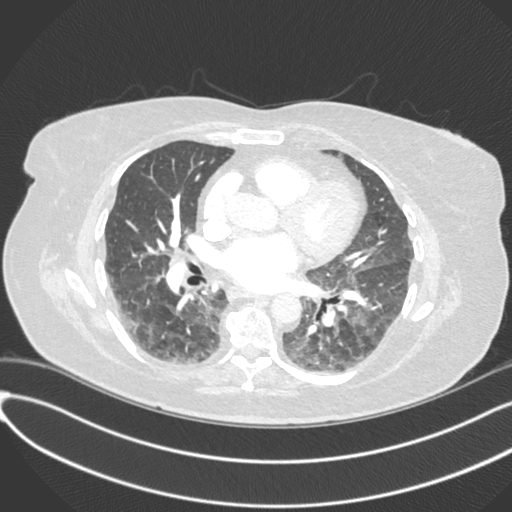
[im 168/303  soft-tissue]
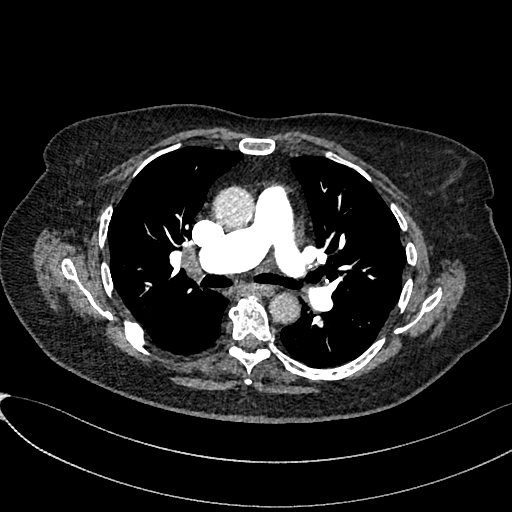
[im 185/303  lung]
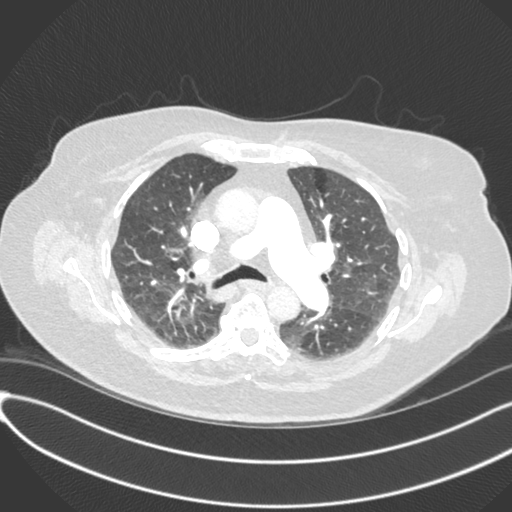
[im 202/303  soft-tissue]
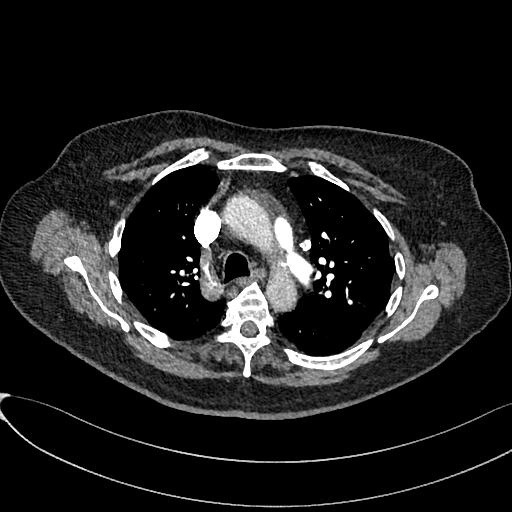
[im 219/303  lung]
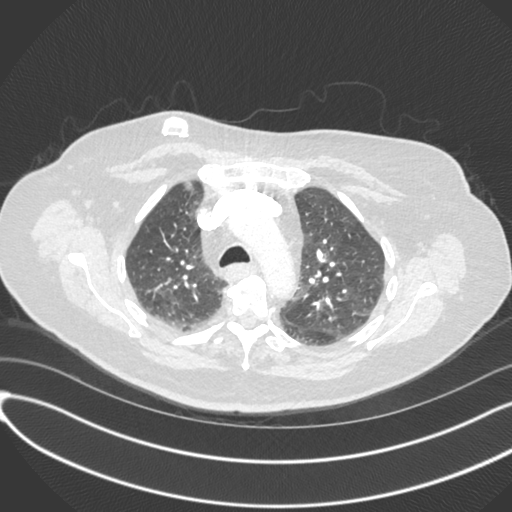
[im 235/303  soft-tissue]
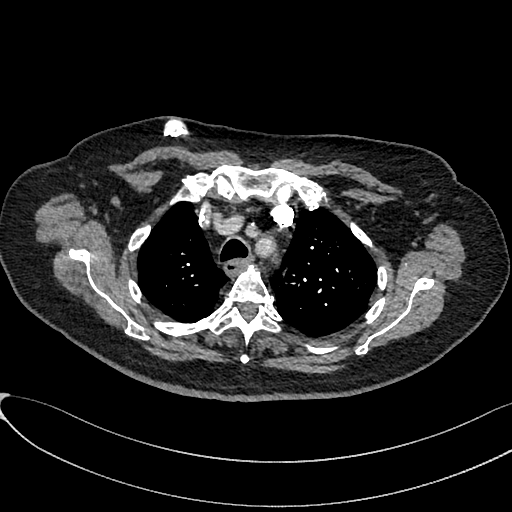
[im 269/303  lung]
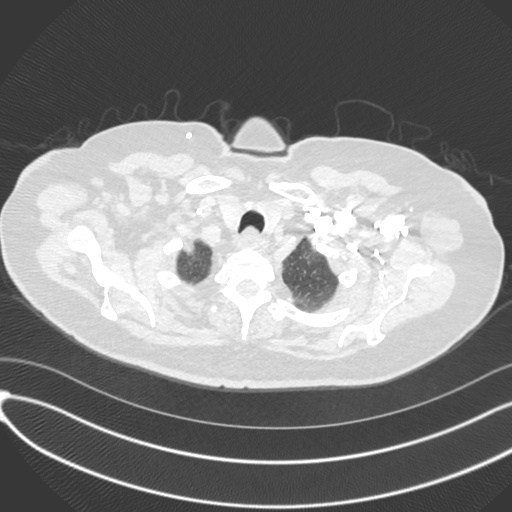
[im 286/303  soft-tissue]
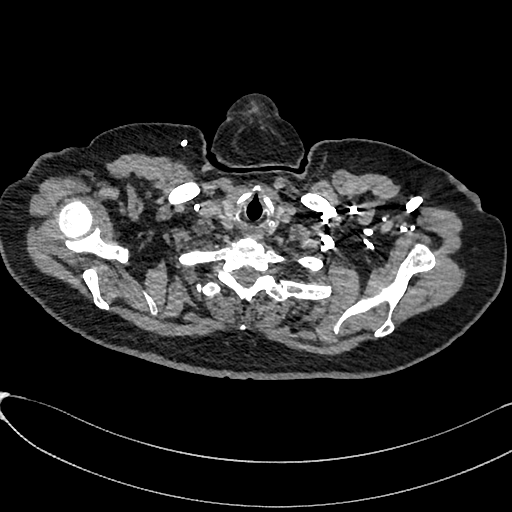

[Series 8: cor · coronal · 0.44mm/px · 3 of 134 slices shown]
[im 34/134  soft-tissue]
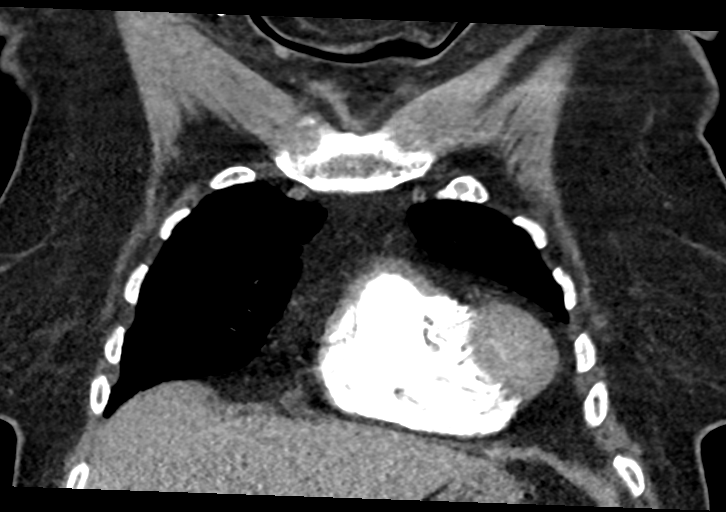
[im 67/134  soft-tissue]
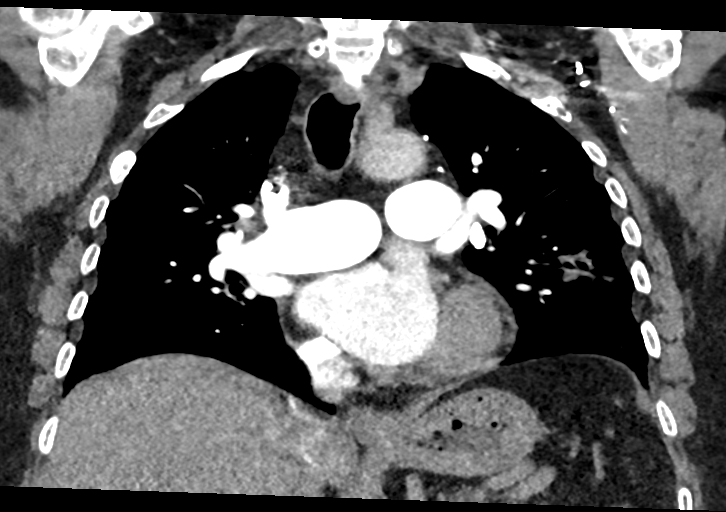
[im 100/134  soft-tissue]
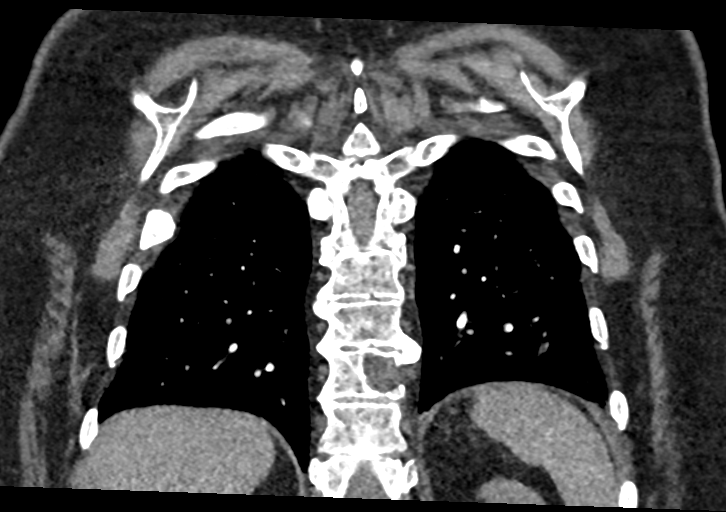

[17 of 46 positions shown; findings below may reference images not displayed]

FINDINGS: Cardiovascular: There are no filling defects within the pulmonary
arteries to suggest pulmonary embolus. Atherosclerosis of the
thoracic aorta. No obvious aortic dissection. Heart is normal in
size. Scattered coronary artery calcifications. Trace pericardial
fluid which measures up to 9 mm in depth adjacent to the right
heart. Right internal jugular central venous catheter tip in the
lower SVC.

Mediastinum/Nodes: Calcified mediastinal and left hilar lymph nodes
consistent with prior granulomatous disease. Mildly enlarged 11 mm
right hilar node. Additional prominent right infrahilar nodes that
are subcentimeter. No esophageal wall thickening. No visualized
thyroid nodule. No axillary adenopathy.

Lungs/Pleura: Heterogeneous pulmonary parenchyma with areas of
ground-glass opacities. Calcified granuloma in the left upper lobe.
Anterior left upper lobe opacity appears elongated on axial images
but more nodular on coronal reformats, for example series 6, image
55 and series 8, image 66. Thin walled cysts in the left upper lobe.
Trachea and central bronchi are patent. No findings of pulmonary
edema. No pleural fluid.

Upper Abdomen: Splenic calcified granuloma. No acute findings.

Musculoskeletal: Lucent lesion within left aspect of T9 vertebra
consistent with myelomatous lesion is seen on thoracic spine MRI
12/19/2019. Multiple remote bilateral rib fractures. Small cortical
lucent lesion within medial left tenth rib, series 8, image 107.
Multiple tiny additional rib lesions bilaterally that are all
subcentimeter. Tiny lucent lesion in the sternum, series 5, image
57. No evidence of pathologic or acute fracture.

Review of the MIP images confirms the above findings.
IMPRESSION: 1. No pulmonary embolus.
2. Heterogeneous pulmonary parenchyma with areas of ground-glass,
favoring small airways or small vessel disease.
3. Anterior left upper lobe opacity appears elongated on axial
images but more nodular on coronal reformats. Recommend attention on
follow-up.
4. Mildly enlarged right hilar node is likely reactive.
5. Lucent lesion in T9 vertebra consistent with myelomatous lesion
is seen on thoracic spine MRI 12/19/2019. Multiple additional tiny
lucent lesions throughout bilateral ribs and sternum, likely also
related to multiple myeloma. No acute or pathologic fracture.

Aortic Atherosclerosis ([id number]).

## 2022-06-26 ENCOUNTER — Other Ambulatory Visit: Payer: Self-pay | Admitting: Hematology

## 2022-06-26 DIAGNOSIS — E876 Hypokalemia: Secondary | ICD-10-CM

## 2022-06-30 ENCOUNTER — Other Ambulatory Visit: Payer: Self-pay

## 2022-06-30 DIAGNOSIS — C9000 Multiple myeloma not having achieved remission: Secondary | ICD-10-CM

## 2022-07-02 ENCOUNTER — Inpatient Hospital Stay: Payer: Medicare PPO

## 2022-07-02 ENCOUNTER — Other Ambulatory Visit: Payer: Self-pay | Admitting: Hematology and Oncology

## 2022-07-02 VITALS — BP 117/71 | HR 67 | Temp 98.1°F | Resp 16 | Wt 149.8 lb

## 2022-07-02 DIAGNOSIS — Z7189 Other specified counseling: Secondary | ICD-10-CM

## 2022-07-02 DIAGNOSIS — C9000 Multiple myeloma not having achieved remission: Secondary | ICD-10-CM | POA: Diagnosis not present

## 2022-07-02 DIAGNOSIS — Z79899 Other long term (current) drug therapy: Secondary | ICD-10-CM | POA: Diagnosis not present

## 2022-07-02 DIAGNOSIS — Z95828 Presence of other vascular implants and grafts: Secondary | ICD-10-CM

## 2022-07-02 DIAGNOSIS — Z5112 Encounter for antineoplastic immunotherapy: Secondary | ICD-10-CM | POA: Diagnosis not present

## 2022-07-02 LAB — CBC WITH DIFFERENTIAL (CANCER CENTER ONLY)
Abs Immature Granulocytes: 0.02 10*3/uL (ref 0.00–0.07)
Basophils Absolute: 0 10*3/uL (ref 0.0–0.1)
Basophils Relative: 1 %
Eosinophils Absolute: 0.1 10*3/uL (ref 0.0–0.5)
Eosinophils Relative: 1 %
HCT: 36.5 % (ref 36.0–46.0)
Hemoglobin: 12.4 g/dL (ref 12.0–15.0)
Immature Granulocytes: 1 %
Lymphocytes Relative: 23 %
Lymphs Abs: 0.9 10*3/uL (ref 0.7–4.0)
MCH: 34.2 pg — ABNORMAL HIGH (ref 26.0–34.0)
MCHC: 34 g/dL (ref 30.0–36.0)
MCV: 100.6 fL — ABNORMAL HIGH (ref 80.0–100.0)
Monocytes Absolute: 0.4 10*3/uL (ref 0.1–1.0)
Monocytes Relative: 10 %
Neutro Abs: 2.7 10*3/uL (ref 1.7–7.7)
Neutrophils Relative %: 64 %
Platelet Count: 152 10*3/uL (ref 150–400)
RBC: 3.63 MIL/uL — ABNORMAL LOW (ref 3.87–5.11)
RDW: 15 % (ref 11.5–15.5)
WBC Count: 4.1 10*3/uL (ref 4.0–10.5)
nRBC: 0 % (ref 0.0–0.2)

## 2022-07-02 LAB — CMP (CANCER CENTER ONLY)
ALT: 10 U/L (ref 0–44)
AST: 10 U/L — ABNORMAL LOW (ref 15–41)
Albumin: 3.9 g/dL (ref 3.5–5.0)
Alkaline Phosphatase: 88 U/L (ref 38–126)
Anion gap: 10 (ref 5–15)
BUN: 13 mg/dL (ref 8–23)
CO2: 19 mmol/L — ABNORMAL LOW (ref 22–32)
Calcium: 9.4 mg/dL (ref 8.9–10.3)
Chloride: 110 mmol/L (ref 98–111)
Creatinine: 0.98 mg/dL (ref 0.44–1.00)
GFR, Estimated: 59 mL/min — ABNORMAL LOW (ref >60)
Glucose, Bld: 259 mg/dL — ABNORMAL HIGH (ref 70–99)
Potassium: 3.4 mmol/L — ABNORMAL LOW (ref 3.5–5.1)
Sodium: 139 mmol/L (ref 135–145)
Total Bilirubin: 0.6 mg/dL (ref 0.3–1.2)
Total Protein: 6.4 g/dL — ABNORMAL LOW (ref 6.5–8.1)

## 2022-07-02 MED ORDER — PROCHLORPERAZINE MALEATE 10 MG PO TABS
10.0000 mg | ORAL_TABLET | Freq: Once | ORAL | Status: AC
  Administered 2022-07-02: 10 mg via ORAL
  Filled 2022-07-02: qty 1

## 2022-07-02 MED ORDER — DEXTROSE 5 % IV SOLN
33.0000 mg/m2 | Freq: Once | INTRAVENOUS | Status: AC
  Administered 2022-07-02: 60 mg via INTRAVENOUS
  Filled 2022-07-02: qty 30

## 2022-07-02 MED ORDER — SODIUM CHLORIDE 0.9% FLUSH
10.0000 mL | INTRAVENOUS | Status: DC | PRN
  Administered 2022-07-02: 10 mL

## 2022-07-02 MED ORDER — SODIUM CHLORIDE 0.9% FLUSH
10.0000 mL | Freq: Once | INTRAVENOUS | Status: AC
  Administered 2022-07-02: 10 mL

## 2022-07-02 MED ORDER — DEXAMETHASONE 4 MG PO TABS
12.0000 mg | ORAL_TABLET | Freq: Once | ORAL | Status: AC
  Administered 2022-07-02: 12 mg via ORAL
  Filled 2022-07-02: qty 3

## 2022-07-02 MED ORDER — DIPHENHYDRAMINE HCL 25 MG PO CAPS
25.0000 mg | ORAL_CAPSULE | Freq: Once | ORAL | Status: AC
  Administered 2022-07-02: 25 mg via ORAL
  Filled 2022-07-02: qty 1

## 2022-07-02 MED ORDER — HEPARIN SOD (PORK) LOCK FLUSH 100 UNIT/ML IV SOLN
500.0000 [IU] | Freq: Once | INTRAVENOUS | Status: AC | PRN
  Administered 2022-07-02: 500 [IU]

## 2022-07-02 MED ORDER — ACETAMINOPHEN 325 MG PO TABS
650.0000 mg | ORAL_TABLET | Freq: Once | ORAL | Status: AC
  Administered 2022-07-02: 650 mg via ORAL
  Filled 2022-07-02: qty 2

## 2022-07-02 MED ORDER — FAMOTIDINE 20 MG PO TABS
20.0000 mg | ORAL_TABLET | Freq: Once | ORAL | Status: AC
  Administered 2022-07-02: 20 mg via ORAL
  Filled 2022-07-02: qty 1

## 2022-07-02 MED ORDER — SODIUM CHLORIDE 0.9 % IV SOLN: Freq: Once | INTRAVENOUS | Status: DC

## 2022-07-02 MED ORDER — SODIUM CHLORIDE 0.9 % IV SOLN: Freq: Once | INTRAVENOUS | Status: AC

## 2022-07-02 NOTE — Patient Instructions (Signed)
[facility]  Discharge Instructions: Thank you for choosing [facility] to provide your oncology and hematology care.   If you have a lab appointment with the [facility], please go directly to the [facility] and check in at the registration area.   Wear comfortable clothing and clothing appropriate for easy access to any Portacath or PICC line.   We strive to give you quality time with your provider. You may need to reschedule your appointment if you arrive late (15 or more minutes).  Arriving late affects you and other patients whose appointments are after yours.  Also, if you miss three or more appointments without notifying the office, you may be dismissed from the clinic at the provider's discretion.      For prescription refill requests, have your pharmacy contact our office and allow 72 hours for refills to be completed.    Today you received the following chemotherapy and/or immunotherapy agents: Kyprolis      To help prevent nausea and vomiting after your treatment, we encourage you to take your nausea medication as directed.  BELOW ARE SYMPTOMS THAT SHOULD BE REPORTED IMMEDIATELY: *FEVER GREATER THAN 100.4 F (38 C) OR HIGHER *CHILLS OR SWEATING *NAUSEA AND VOMITING THAT IS NOT CONTROLLED WITH YOUR NAUSEA MEDICATION *UNUSUAL SHORTNESS OF BREATH *UNUSUAL BRUISING OR BLEEDING *URINARY PROBLEMS (pain or burning when urinating, or frequent urination) *BOWEL PROBLEMS (unusual diarrhea, constipation, pain near the anus) TENDERNESS IN MOUTH AND THROAT WITH OR WITHOUT PRESENCE OF ULCERS (sore throat, sores in mouth, or a toothache) UNUSUAL RASH, SWELLING OR PAIN  UNUSUAL VAGINAL DISCHARGE OR ITCHING   Items with * indicate a potential emergency and should be followed up as soon as possible or go to the Emergency Department if any problems should occur.  Please show the CHEMOTHERAPY ALERT CARD or IMMUNOTHERAPY ALERT CARD at check-in to  the Emergency Department and triage nurse.  Should you have questions after your visit or need to cancel or reschedule your appointment, please contact [facility]  Dept: [phone]  and follow the prompts.  Office hours are 8:00 a.m. to 4:30 p.m. Monday - Friday. Please note that voicemails left after 4:00 p.m. may not be returned until the following business day.  We are closed weekends and major holidays. You have access to a nurse at all times for urgent questions. Please call the main number to the clinic Dept: [phone] and follow the prompts.   For any non-urgent questions, you may also contact your provider using MyChart. We now offer e-Visits for anyone 61 and older to request care online for non-urgent symptoms. For details visit [email or web address].   Also download the MyChart app! Go to the app store, search "MyChart", open the app, select [facility], and log in with your MyChart username and password.  Masks are optional in the cancer centers. If you would like for your care team to wear a mask while they are taking care of you, please let them know. For doctor visits, patients may have with them one support person who is at least 79 years old. At this time, visitors are not allowed in the infusion area.

## 2022-07-06 ENCOUNTER — Other Ambulatory Visit: Payer: Self-pay

## 2022-07-09 ENCOUNTER — Encounter: Payer: Self-pay | Admitting: Hematology

## 2022-07-09 ENCOUNTER — Other Ambulatory Visit: Payer: Self-pay

## 2022-07-09 DIAGNOSIS — C9000 Multiple myeloma not having achieved remission: Secondary | ICD-10-CM

## 2022-07-10 ENCOUNTER — Other Ambulatory Visit: Payer: Self-pay

## 2022-07-13 ENCOUNTER — Encounter: Payer: Self-pay | Admitting: Hematology

## 2022-07-13 ENCOUNTER — Other Ambulatory Visit: Payer: Self-pay | Admitting: Hematology

## 2022-07-13 DIAGNOSIS — C9000 Multiple myeloma not having achieved remission: Secondary | ICD-10-CM

## 2022-07-13 MED ORDER — OXYCODONE HCL 10 MG PO TABS: 10.0000 mg | ORAL_TABLET | Freq: Four times a day (QID) | ORAL | 0 refills | Status: DC | PRN

## 2022-07-15 ENCOUNTER — Other Ambulatory Visit: Payer: Self-pay

## 2022-07-15 DIAGNOSIS — C9000 Multiple myeloma not having achieved remission: Secondary | ICD-10-CM

## 2022-07-16 ENCOUNTER — Other Ambulatory Visit: Payer: Self-pay

## 2022-07-16 ENCOUNTER — Encounter: Payer: Self-pay | Admitting: Hematology

## 2022-07-17 ENCOUNTER — Inpatient Hospital Stay: Payer: Medicare PPO | Admitting: Hematology

## 2022-07-17 ENCOUNTER — Other Ambulatory Visit: Payer: Self-pay | Admitting: Hematology

## 2022-07-17 ENCOUNTER — Inpatient Hospital Stay: Payer: Medicare PPO

## 2022-07-17 ENCOUNTER — Other Ambulatory Visit: Payer: Self-pay

## 2022-07-17 ENCOUNTER — Inpatient Hospital Stay: Payer: Medicare PPO | Attending: Hematology

## 2022-07-17 DIAGNOSIS — Z5112 Encounter for antineoplastic immunotherapy: Secondary | ICD-10-CM | POA: Diagnosis not present

## 2022-07-17 DIAGNOSIS — Z7189 Other specified counseling: Secondary | ICD-10-CM

## 2022-07-17 DIAGNOSIS — Z79899 Other long term (current) drug therapy: Secondary | ICD-10-CM | POA: Diagnosis not present

## 2022-07-17 DIAGNOSIS — C9000 Multiple myeloma not having achieved remission: Secondary | ICD-10-CM

## 2022-07-17 DIAGNOSIS — Z95828 Presence of other vascular implants and grafts: Secondary | ICD-10-CM

## 2022-07-17 LAB — CBC WITH DIFFERENTIAL (CANCER CENTER ONLY)
Abs Immature Granulocytes: 0.02 10*3/uL (ref 0.00–0.07)
Basophils Absolute: 0 10*3/uL (ref 0.0–0.1)
Basophils Relative: 1 %
Eosinophils Absolute: 0.1 10*3/uL (ref 0.0–0.5)
Eosinophils Relative: 4 %
HCT: 35.9 % — ABNORMAL LOW (ref 36.0–46.0)
Hemoglobin: 12.1 g/dL (ref 12.0–15.0)
Immature Granulocytes: 1 %
Lymphocytes Relative: 26 %
Lymphs Abs: 1 10*3/uL (ref 0.7–4.0)
MCH: 33.7 pg (ref 26.0–34.0)
MCHC: 33.7 g/dL (ref 30.0–36.0)
MCV: 100 fL (ref 80.0–100.0)
Monocytes Absolute: 0.8 10*3/uL (ref 0.1–1.0)
Monocytes Relative: 21 %
Neutro Abs: 1.9 10*3/uL (ref 1.7–7.7)
Neutrophils Relative %: 47 %
Platelet Count: 124 10*3/uL — ABNORMAL LOW (ref 150–400)
RBC: 3.59 MIL/uL — ABNORMAL LOW (ref 3.87–5.11)
RDW: 15.4 % (ref 11.5–15.5)
WBC Count: 3.9 10*3/uL — ABNORMAL LOW (ref 4.0–10.5)
nRBC: 0 % (ref 0.0–0.2)

## 2022-07-17 LAB — CMP (CANCER CENTER ONLY)
ALT: 11 U/L (ref 0–44)
AST: 11 U/L — ABNORMAL LOW (ref 15–41)
Albumin: 3.8 g/dL (ref 3.5–5.0)
Alkaline Phosphatase: 91 U/L (ref 38–126)
Anion gap: 10 (ref 5–15)
BUN: 10 mg/dL (ref 8–23)
CO2: 17 mmol/L — ABNORMAL LOW (ref 22–32)
Calcium: 8.8 mg/dL — ABNORMAL LOW (ref 8.9–10.3)
Chloride: 109 mmol/L (ref 98–111)
Creatinine: 0.94 mg/dL (ref 0.44–1.00)
GFR, Estimated: >60 mL/min (ref >60)
Glucose, Bld: 226 mg/dL — ABNORMAL HIGH (ref 70–99)
Potassium: 3.3 mmol/L — ABNORMAL LOW (ref 3.5–5.1)
Sodium: 136 mmol/L (ref 135–145)
Total Bilirubin: 0.5 mg/dL (ref 0.3–1.2)
Total Protein: 6.4 g/dL — ABNORMAL LOW (ref 6.5–8.1)

## 2022-07-17 MED ORDER — SODIUM CHLORIDE 0.9% FLUSH
10.0000 mL | Freq: Once | INTRAVENOUS | Status: AC
  Administered 2022-07-17: 10 mL

## 2022-07-17 MED ORDER — PROCHLORPERAZINE MALEATE 10 MG PO TABS
10.0000 mg | ORAL_TABLET | Freq: Once | ORAL | Status: AC
  Administered 2022-07-17: 10 mg via ORAL
  Filled 2022-07-17: qty 1

## 2022-07-17 MED ORDER — ZOLEDRONIC ACID 4 MG/100ML IV SOLN
4.0000 mg | Freq: Once | INTRAVENOUS | Status: AC
  Administered 2022-07-17: 4 mg via INTRAVENOUS
  Filled 2022-07-17: qty 100

## 2022-07-17 MED ORDER — SODIUM CHLORIDE 0.9% FLUSH
10.0000 mL | INTRAVENOUS | Status: DC | PRN
  Administered 2022-07-17: 10 mL

## 2022-07-17 MED ORDER — DIPHENHYDRAMINE HCL 25 MG PO CAPS
25.0000 mg | ORAL_CAPSULE | Freq: Once | ORAL | Status: AC
  Administered 2022-07-17: 25 mg via ORAL
  Filled 2022-07-17: qty 1

## 2022-07-17 MED ORDER — ACETAMINOPHEN 325 MG PO TABS
650.0000 mg | ORAL_TABLET | Freq: Once | ORAL | Status: AC
  Administered 2022-07-17: 650 mg via ORAL
  Filled 2022-07-17: qty 2

## 2022-07-17 MED ORDER — ACYCLOVIR 400 MG PO TABS: 400.0000 mg | ORAL_TABLET | Freq: Two times a day (BID) | ORAL | 5 refills | Status: DC

## 2022-07-17 MED ORDER — SODIUM CHLORIDE 0.9 % IV SOLN: Freq: Once | INTRAVENOUS | Status: AC

## 2022-07-17 MED ORDER — FAMOTIDINE 20 MG PO TABS
20.0000 mg | ORAL_TABLET | Freq: Once | ORAL | Status: AC
  Administered 2022-07-17: 20 mg via ORAL
  Filled 2022-07-17: qty 1

## 2022-07-17 MED ORDER — DEXTROSE 5 % IV SOLN
33.0000 mg/m2 | Freq: Once | INTRAVENOUS | Status: AC
  Administered 2022-07-17: 60 mg via INTRAVENOUS
  Filled 2022-07-17: qty 30

## 2022-07-17 MED ORDER — HEPARIN SOD (PORK) LOCK FLUSH 100 UNIT/ML IV SOLN
500.0000 [IU] | Freq: Once | INTRAVENOUS | Status: AC | PRN
  Administered 2022-07-17: 500 [IU]

## 2022-07-17 MED ORDER — ONDANSETRON HCL 8 MG PO TABS: 8.0000 mg | ORAL_TABLET | Freq: Two times a day (BID) | ORAL | 1 refills | Status: DC | PRN

## 2022-07-17 MED ORDER — FENTANYL 12 MCG/HR TD PT72: 1.0000 | MEDICATED_PATCH | TRANSDERMAL | 0 refills | Status: DC

## 2022-07-17 MED ORDER — DEXAMETHASONE 4 MG PO TABS
12.0000 mg | ORAL_TABLET | Freq: Once | ORAL | Status: AC
  Administered 2022-07-17: 12 mg via ORAL
  Filled 2022-07-17: qty 3

## 2022-07-17 NOTE — Patient Instructions (Signed)
[facility]  Discharge Instructions: Thank you for choosing [facility] to provide your oncology and hematology care.   If you have a lab appointment with the Cancer Center, please go directly to the Cancer Center and check in at the registration area.   Wear comfortable clothing and clothing appropriate for easy access to any Portacath or PICC line.   We strive to give you quality time with your provider. You may need to reschedule your appointment if you arrive late (15 or more minutes).  Arriving late affects you and other patients whose appointments are after yours.  Also, if you miss three or more appointments without notifying the office, you may be dismissed from the clinic at the provider's discretion.      For prescription refill requests, have your pharmacy contact our office and allow 72 hours for refills to be completed.    Today you received the following chemotherapy and/or immunotherapy agents: carfilzomib      To help prevent nausea and vomiting after your treatment, we encourage you to take your nausea medication as directed.  BELOW ARE SYMPTOMS THAT SHOULD BE REPORTED IMMEDIATELY: *FEVER GREATER THAN 100.4 F (38 C) OR HIGHER *CHILLS OR SWEATING *NAUSEA AND VOMITING THAT IS NOT CONTROLLED WITH YOUR NAUSEA MEDICATION *UNUSUAL SHORTNESS OF BREATH *UNUSUAL BRUISING OR BLEEDING *URINARY PROBLEMS (pain or burning when urinating, or frequent urination) *BOWEL PROBLEMS (unusual diarrhea, constipation, pain near the anus) TENDERNESS IN MOUTH AND THROAT WITH OR WITHOUT PRESENCE OF ULCERS (sore throat, sores in mouth, or a toothache) UNUSUAL RASH, SWELLING OR PAIN  UNUSUAL VAGINAL DISCHARGE OR ITCHING   Items with * indicate a potential emergency and should be followed up as soon as possible or go to the Emergency Department if any problems should occur.  Please show the CHEMOTHERAPY ALERT CARD or IMMUNOTHERAPY ALERT CARD at check-in  to the Emergency Department and triage nurse.  Should you have questions after your visit or need to cancel or reschedule your appointment, please contact [facility]  Dept: [phone]  and follow the prompts.  Office hours are 8:00 a.m. to 4:30 p.m. Monday - Friday. Please note that voicemails left after 4:00 p.m. may not be returned until the following business day.  We are closed weekends and major holidays. You have access to a nurse at all times for urgent questions. Please call the main number to the clinic Dept: [phone] and follow the prompts.   For any non-urgent questions, you may also contact your provider using MyChart. We now offer e-Visits for anyone 18 and older to request care online for non-urgent symptoms. For details visit [email or web address].   Also download the MyChart app! Go to the app store, search "MyChart", open the app, select , and log in with your MyChart username and password.  Masks are optional in the cancer centers. If you would like for your care team to wear a mask while they are taking care of you, please let them know. You may have one support person who is at least 79 years old accompany you for your appointments. 

## 2022-07-20 ENCOUNTER — Other Ambulatory Visit: Payer: Self-pay

## 2022-07-20 ENCOUNTER — Encounter: Payer: Self-pay | Admitting: Hematology

## 2022-07-20 DIAGNOSIS — C9000 Multiple myeloma not having achieved remission: Secondary | ICD-10-CM

## 2022-07-20 MED ORDER — MECLIZINE HCL 25 MG PO TABS: 25.0000 mg | ORAL_TABLET | Freq: Three times a day (TID) | ORAL | 0 refills | Status: AC | PRN

## 2022-07-21 ENCOUNTER — Telehealth: Payer: Self-pay | Admitting: Hematology

## 2022-07-21 ENCOUNTER — Other Ambulatory Visit: Payer: Self-pay

## 2022-07-21 NOTE — Telephone Encounter (Signed)
Scheduled per 08/04 los, patient has been called and notified of all upcoming appointments.

## 2022-07-24 ENCOUNTER — Other Ambulatory Visit: Payer: Self-pay | Admitting: Internal Medicine

## 2022-07-24 ENCOUNTER — Encounter: Payer: Self-pay | Admitting: Hematology

## 2022-07-24 NOTE — Progress Notes (Signed)
HEMATOLOGY/ONCOLOGY CLINIC NOTE  Date of Service: 07/17/2022   Patient Care Team: Hoyt Koch, MD as PCP - General (Internal Medicine) Lafayette Dragon, MD (Inactive) as Consulting Physician (Gastroenterology) Megan Salon, MD as Consulting Physician (Gynecology) Melrose Nakayama, MD as Consulting Physician (Orthopedic Surgery) Deneise Lever, MD as Consulting Physician (Pulmonary Disease) [name], MD (Ophthalmology)  CHIEF COMPLAINTS/PURPOSE OF CONSULTATION:  For continued evaluation and management of multiple myeloma  HISTORY OF PRESENTING ILLNESS:  Please see previous notes for details on initial presentation  Current Treatment: Carfilzomib + Revlimid maintenance.  INTERVAL HISTORY:   Faith Orr is a 81 y.o. female is here for continued evaluation and management of multiple myeloma.  She notes grade 1-2 fatigue with her treatment but notes that she is continuing to manage with that. No new infection issues since her last clinic visit. No other reported acute toxicities from her current carfilzomib and Revlimid treatment at this time. Labs done today were reviewed with her in detail. Medications requested were refilled. Her last PET CT scan was done on 06/10/2022 and was reviewed with her.  Left neck lymph nodes showed some increased FDG avidity but the patient wants to do conservative approach and continue to monitor these.  No significant new bone lesions noted.  MEDICAL HISTORY:  Past Medical History:  Diagnosis Date   Allergy    seasonal   Asthma    DEPRESSION    DIABETES MELLITUS, TYPE II    Diverticulosis    HYPERLIPIDEMIA    Macular degeneration of left eye    mild, Dr.Hecker   Obesity, unspecified    Osteoarthritis of both knees    OSTEOPENIA    Osteopenia    URINARY INCONTINENCE     SURGICAL HISTORY: Past Surgical History:  Procedure Laterality Date   CATARACT EXTRACTION Left 05/24/2018   CESAREAN SECTION  01/1973    CYSTOSCOPY/URETEROSCOPY/HOLMIUM LASER/STENT PLACEMENT Right 09/20/2019   Procedure: CYSTOSCOPY/URETEROSCOPY/HOLMIUM LASER/STENT PLACEMENT;  Surgeon: Lucas Mallow, MD;  Location: [facility];  Service: Urology;  Laterality: Right;   FRACTURE SURGERY     IR IMAGING GUIDED PORT INSERTION  02/20/2019   left wrist surgery  2008   By Dr. Latanya Maudlin   right ankle  1994    SOCIAL HISTORY: Social History   Socioeconomic History   Marital status: Married    Spouse name: Not on file   Number of children: 1   Years of education: Not on file   Highest education level: Not on file  Occupational History    Employer: [employer]  Tobacco Use   Smoking status: Never   Smokeless tobacco: Never   Tobacco comments:    Lives with partner [name]) and son  Vaping Use   Vaping Use: Never used  Substance and Sexual Activity   Alcohol use: No    Alcohol/week: 0.0 standard drinks of alcohol   Drug use: No   Sexual activity: Never    Partners: Female    Birth control/protection: Post-menopausal    Comment: Lives with female partner (annette hicks) and 29 yo son  Other Topics Concern   Not on file  Social History Narrative   Not on file   Social Determinants of Health   Financial Resource Strain: Low Risk  (02/27/2022)   Overall Financial Resource Strain (CARDIA)    Difficulty of Paying Living Expenses: Not hard at all  Food Insecurity: No Food Insecurity (02/27/2022)   Hunger Vital Sign  Worried About Charity fundraiser in the Last Year: Never true    [place] in the Last Year: Never true  Transportation Needs: No Transportation Needs (02/27/2022)   PRAPARE - Hydrologist (Medical): No    Lack of Transportation (Non-Medical): No  Physical Activity: Unknown (02/27/2022)   Exercise Vital Sign    Days of Exercise per Week: Patient refused    Minutes of Exercise per Session: Patient refused  Stress: No Stress Concern  Present (02/27/2022)   [place]    Feeling of Stress : Not at all  Social Connections: Unknown (02/27/2022)   Social Connection and Isolation Panel [NHANES]    Frequency of Communication with Friends and Family: Patient refused    Frequency of Social Gatherings with Friends and Family: Patient refused    Attends Religious Services: Patient refused    Active Member of Clubs or Organizations: Patient refused    Attends Archivist Meetings: Patient refused    Marital Status: Living with partner  Intimate Partner Violence: Not At Risk (05/08/2021)   Humiliation, Afraid, Rape, and Kick questionnaire    Fear of Current or Ex-Partner: No    Emotionally Abused: No    Physically Abused: No    Sexually Abused: No    FAMILY HISTORY: Family History  Problem Relation Age of Onset   Diabetes Father    Hyperlipidemia Father    Heart disease Father    Cancer Father    Hypertension Father    Colon cancer Paternal Grandmother 80   Osteoporosis Mother    Protein S deficiency Mother    Hyperlipidemia Mother    Multiple sclerosis Daughter    Cancer Other        bladder   Breast cancer Neg Hx     ALLERGIES:  is allergic to levofloxacin, penicillins, aleve [naproxen sodium], and sulfonamide derivatives.  MEDICATIONS:  Current Outpatient Medications  Medication Sig Dispense Refill   acyclovir (ZOVIRAX) 400 MG tablet Take 1 tablet (400 mg total) by mouth 2 (two) times daily. 60 tablet 5   Blood Glucose Monitoring Suppl (FREESTYLE FREEDOM LITE) W/DEVICE KIT Use to check blood sugars twice a day Dx 250.00 1 each 0   Calcium Carbonate-Vitamin D 600-400 MG-UNIT tablet Take 1 tablet by mouth 2 (two) times daily.     dexamethasone (DECADRON) 4 MG tablet Take 5 tablets (20 mg total) by mouth once a week. On D22 of each cycle of treatment 20 tablet 5   ELIQUIS 2.5 MG TABS tablet TAKE 1 TABLET(2.5 MG) BY MOUTH TWICE DAILY 60  tablet 2   fentaNYL (DURAGESIC) 12 MCG/HR Place 1 patch onto the skin every 3 (three) days. 10 patch 0   fluticasone (FLONASE) 50 MCG/ACT nasal spray Place 1 spray into both nostrils daily. 16 g 2   glucose blood (FREESTYLE LITE) test strip CHECK BLOOD SUGAR TWICE DAILY AS DIRECTED Dx 250.00 180 each 3   Lancets (FREESTYLE) lancets Use twice daily to check sugars. 100 each 11   lenalidomide (REVLIMID) 5 MG capsule TAKE 1 CAPSULE BY MOUTH DAILY  FOR 21 DAYS, THEN 7 DAYS OFF 21 capsule 0   lidocaine-prilocaine (EMLA) cream APPLY 1 APPLICATION TO THE AFFECTED AREA AS NEEDED. USE PRIOR TO PORT ACCESS 30 g 0   meclizine (ANTIVERT) 25 MG tablet Take 1 tablet (25 mg total) by mouth every 8 (eight) hours as needed for dizziness. 20 tablet 0  metFORMIN (GLUCOPHAGE-XR) 500 MG 24 hr tablet Take 3 tablets (1,500 mg total) by mouth daily with breakfast. TAKE 3 TABLETS(1500 MG) BY MOUTH DAILY WITH BREAKFAST Strength: 500 mg 180 tablet 1   Multiple Vitamins-Minerals (ICAPS) CAPS Take 1 capsule by mouth daily after breakfast.     ondansetron (ZOFRAN) 8 MG tablet Take 1 tablet (8 mg total) by mouth 2 (two) times daily as needed (Nausea or vomiting). 30 tablet 1   Oxycodone HCl 10 MG TABS Take 1 tablet (10 mg total) by mouth every 6 (six) hours as needed. 90 tablet 0   polyethylene glycol (MIRALAX / GLYCOLAX) packet Take 17 g by mouth daily after breakfast.     potassium chloride SA (KLOR-CON M) 20 MEQ tablet TAKE 2 TABLETS BY MOUTH TWICE DAILY 360 tablet 1   prochlorperazine (COMPAZINE) 10 MG tablet Take 1 tablet (10 mg total) by mouth every 6 (six) hours as needed (Nausea or vomiting). 30 tablet 1   senna-docusate (SENNA S) 8.6-50 MG tablet Take 2 tablets by mouth at bedtime. 60 tablet 2   sertraline (ZOLOFT) 100 MG tablet TAKE 1 TABLET(100 MG) BY MOUTH DAILY 90 tablet 0   simvastatin (ZOCOR) 20 MG tablet TAKE 1 TABLET BY MOUTH EVERY DAY AT 6 PM 90 tablet 3   Vitamin D, Ergocalciferol, (DRISDOL) 1.25 MG (50000  UNIT) CAPS capsule TAKE 1 CAPSULE BY MOUTH EVERY 7 DAYS 12 capsule 0   No current facility-administered medications for this visit.   Facility-Administered Medications Ordered in Other Visits  Medication Dose Route Frequency Provider Last Rate Last Admin   heparin lock flush 100 unit/mL  500 Units Intracatheter Once PRN Brunetta Genera, MD       sodium chloride flush (NS) 0.9 % injection 10 mL  10 mL Intracatheter PRN Brunetta Genera, MD   10 mL at 10/02/19 1524    REVIEW OF SYSTEMS:   10 Point review of Systems was done is negative except as noted above.  PHYSICAL EXAMINATION: ECOG FS:2 - Symptomatic, <50% confined to bed  Vitals:   07/17/22 1258  BP: 130/73  Pulse: 73  Temp: 98.2 F (36.8 C)  SpO2: 98%    Wt Readings from Last 3 Encounters:  07/17/22 152 lb 4.8 oz (69.1 kg)  07/02/22 149 lb 12 oz (67.9 kg)  06/18/22 148 lb 4 oz (67.2 kg)   Body mass index is 30.76 kg/m.    NAD GENERAL:alert, in no acute distress and comfortable SKIN: no acute rashes, no significant lesions EYES: conjunctiva are pink and non-injected, sclera anicteric OROPHARYNX: MMM, no exudates, no oropharyngeal erythema or ulceration NECK: supple, no JVD LYMPH:  no palpable lymphadenopathy in the cervical, axillary or inguinal regions LUNGS: clear to auscultation b/l with normal respiratory effort HEART: regular rate & rhythm ABDOMEN:  normoactive bowel sounds , non tender, not distended. Extremity: no pedal edema PSYCH: alert & oriented x 3 with fluent speech NEURO: no focal motor/sensory deficits  I have reviewed the data as listed  .Marland Kitchen    Latest Ref Rng & Units 07/17/2022   12:15 PM 07/02/2022    1:21 PM 06/18/2022    1:41 PM  CBC  WBC 4.0 - 10.5 K/uL 3.9  4.1  3.8   Hemoglobin 12.0 - 15.0 g/dL 12.1  12.4  12.6   Hematocrit 36.0 - 46.0 % 35.9  36.5  35.4   Platelets 150 - 400 K/uL 124  152  119     . CBC    Component  Value Date/Time   WBC 3.9 (L) 07/17/2022 1215   WBC 2.2  (L) 12/11/2021 2254   RBC 3.59 (L) 07/17/2022 1215   HGB 12.1 07/17/2022 1215   HCT 35.9 (L) 07/17/2022 1215   PLT 124 (L) 07/17/2022 1215   MCV 100.0 07/17/2022 1215   MCH 33.7 07/17/2022 1215   MCHC 33.7 07/17/2022 1215   RDW 15.4 07/17/2022 1215   LYMPHSABS 1.0 07/17/2022 1215   MONOABS 0.8 07/17/2022 1215   EOSABS 0.1 07/17/2022 1215   BASOSABS 0.0 07/17/2022 1215  .    Latest Ref Rng & Units 07/17/2022   12:15 PM 07/02/2022    1:21 PM 06/18/2022    1:41 PM  CMP  Glucose 70 - 99 mg/dL 226  259  241   BUN 8 - 23 mg/dL '10  13  19   ' Creatinine 0.44 - 1.00 mg/dL 0.94  0.98  1.13   Sodium 135 - 145 mmol/L 136  139  137   Potassium 3.5 - 5.1 mmol/L 3.3  3.4  2.7   Chloride 98 - 111 mmol/L 109  110  108   CO2 22 - 32 mmol/L '17  19  18   ' Calcium 8.9 - 10.3 mg/dL 8.8  9.4  9.4   Total Protein 6.5 - 8.1 g/dL 6.4  6.4  6.7   Total Bilirubin 0.3 - 1.2 mg/dL 0.5  0.6  0.6   Alkaline Phos 38 - 126 U/L 91  88  90   AST 15 - 41 U/L '11  10  13   ' ALT 0 - 44 U/L '11  10  12      ' 09/18/2019 BM Bx Report ([id number])   09/18/2019 FISH Panel    05/30/2019 BM Bx   01/06/2019 BM Bx:     01/06/19 Cytogenetics:      05/30/19 BM Biopsy:   09/18/2019 FISH Panel    09/18/2019 BM Surgical Pathology ([id number])     RADIOGRAPHIC STUDIES: I have personally reviewed the radiological images as listed and agreed with the findings in the report. No results found.   ASSESSMENT & PLAN:   81 y.o. female with  1.  Nonsecretory multiple Myeloma, RISS Stage III  Labs upon initial presentation from 12/08/18, blood counts are normal including WBC at 7.1k, HGB at 13.1, and PLT at 245k. Calcium normal at 10.3. Creatinine normal at 0.63. M spike at 0.5g. 12/13/18 Bone Scan revealed Multifocal uptake throughout the skeleton, consistent with diffuse metastatic disease. Primary tumor is not specified. 2. Uptake in the proximal right femur, consistent with lytic lesions. 3. Uptake in  the ribs bilaterally as described. 4. Lesions in the proximal left humerus. 5. Diffuse uptake throughout the skull consistent with metastatic disease. 6. Right paramedian uptake at the manubrium.  12/13/18 CT Right Femur revealed Numerous lytic lesions involving the right femur and a lytic lesion in the left inferior pubic ramus. Overall appearance is most concerning for multiple myeloma  12/27/18 Pretreatment 24hour UPEP observed an M spike at 59m, and showed 1917mtotal protein/day.  12/27/18 Pretreatment MMP revealed M Protein at 0.5g with IgG Lambda specificity. Kappa:Lambda light chain ratio at 0.13, with Lambda at 40.3. There is less abnormal protein and light chains than I would expect from 30% plasma cells, which suggests hypo-secretory or non-secretory neoplastic plasma cells. Will have an impact in assessing response. 01/05/19 PET/CT revealed Innumerable lytic lesions in the skeleton compatible with myeloma. Most of the larger lesions are hypermetabolic, for example including a left  proximal humeral shaft lesion with maximum SUV of 8.1 and a 2.8 cm lesion in the left T9 vertebral body with maximum SUV 5.1. Most of the smaller lytic lesions, and some of the larger lesions, do not demonstrate accentuated metabolic activity. 2. 1.2 cm in short axis lymph node in the left parapharyngeal space is hypermetabolic with maximum SUV 11.8. I do not see a separate mass in the head and neck to give rise to this hypermetabolic lymph node. 3. Mosaic attenuation in the lower lobes, nonspecific possibly from air trapping. 4.  Aortic Atherosclerosis 5. Heterogeneous activity in the liver, making it hard to exclude small liver lesions. Consider hepatic protocol MRI with and without contrast for definitive assessment. Nonobstructive right nephrolithiasis. Old granulomatous disease  01/06/19 Bone Marrow biopsy revealed interstitial increase in plasma cells (28% aspirate, 40% CD138 immunohistochemistry). Plasma cells  negative for light chains consistent with a non or weakly secretory myeloma   01/06/19 Cytogenetics revealed 37% of cells with trisomy 11 or 11q deletion, and 40.5% of cells with 17p mutation  S/p 5 cycles of KRD treatment  05/31/19 BM Biopsy revealed mild atypical plasmacytosis at 5% with polytypic variation.   06/01/19 PET/CT revealed "Dominant lesion in the LEFT humerus is decreased significantly in metabolic activity. Additional hypermetabolic skeletal lytic lesions have decreased in metabolic activity or similar to comparison exam (01/05/2019). No evidence of disease progression. 2. Multiple additional lytic lesions do not have metabolic activity and unchanged. 3. No new skeletal lesions are identified. No soft tissue plasmacytoma identified. 4. Nodule / node in the LEFT parapharyngeal space which is intensely hypermetabolic not changed from prior. 5. New hypermetabolic LEFT lower lobe pulmonary nodule is indeterminate. Recommend close attention on follow-up 6. New obstructive hydronephrosis of the RIGHT kidney related to RIGHT UPJ stone."  09/18/2019 BM Bx Report which revealed "Slightly hypercellular bone marrow for age with trilineage hematopoiesis and 1% plasma cells."  09/14/2019 PET/CT Whole Body Scan ([id number]) which revealed "1. There widespread tiny lytic lesions compatible with multiple myeloma. Index larger lesions are generally similar to the prior exam, with low-grade activity such as the left T9 vertebral body lesion with maximum SUV 4.5. Is mild increase in the activity associated with a mildly sclerotic left proximal humeral lesion, maximum SUV 4.8 (previously 3.5). 2. At the site of the prior left lower lobe nodule is currently more bandlike thickening, with maximum SUV only 1.9, probably benign, continued surveillance of this region suggested. 3. There several small but hypermetabolic lymph nodes. This includes a left parapharyngeal space node measuring 1.0 cm with maximum SUV 12.3  (stable); a left level IB lymph node measuring 0.5 cm with maximum SUV 4.8 (slightly larger than prior); and a left inguinal lymph node measuring 0.7 cm in short axis with maximum SUV 6.4 (previously 0.5 cm with maximum SUV 0.6). Significance of these lymph nodes uncertain, surveillance is recommended. 4. New 5 mm left lower lobe subpleural nodule on image 32/8, not appreciably hypermetabolic, surveillance suggested. 5. Focal subcutaneous stranding along the left perineum measuring about 2.6 by 1.1 cm on image 221/4, maximum SUV 12.5. This was not present previously and is most likely inflammatory, although given the notable SUV, surveillance of this region is suggested. 6. Other imaging findings of potential clinical significance: Aortic Atherosclerosis (ICD10-I70.0). Coronary atherosclerosis. Old granulomatous disease. Mild right hydronephrosis due to a 7 mm right UPJ calculus. 2 mm right kidney upper pole nonobstructive renal calculus. Prominent stool throughout the colon favors constipation."  12/19/2019 Thoracic & Lumbar Spine  MRI ([id number]) ([id number]) revealed "Suspected myeloma lesions at T9 and S1. No compression deformity or epidural disease.  01/18/2020 PET/CT ([id number]) which revealed "1. Stable lytic lesions throughout the skeleton. The larger lytic lesions which had mild metabolic activity on comparison exam now have background metabolic activity. No evidence of active myeloma. No evidence of progression multiple myeloma.  No plasmacytoma 3. Hypermetabolic nodules in the LEFT neck may be associated deep tissues of the LEFT parotid gland. 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  01/17/19 MRI Liver revealed Several appreciable liver lesions all have benign imaging characteristics. No MRI findings  of metastatic involvement of the liver. 2. Scattered bony lesions corresponding to the lytic lesions seen at PET-CT, compatible with active myeloma. 3. Aortic Atherosclerosis.  Mild cardiomegaly. 4. Diffuse hepatic steatosis.   3. Left lower lobe pulmonary nodule First seen on 06/01/19 PET/CT PET/CT 7/71/1657: No hypermetabolic mediastinal or hilar nodes. No suspicious pulmonary nodules on the CT scan.  4. 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 Being managed conservatively as per patient's preference.  No symptoms from this currently.  5. Mild chronic kidney disease CMP done 04/09/2022 showed creatinine of 1.17 and GFR est of 48 consistent with mild chronic kidney disease.  PLAN: -Labs done today were discussed in detail with the patient. -PET CT scan from 06/10/2022 was also discussed with the patient.  Shows some increased FDG avidity of neck lymph nodes which have been chronic.  No new focal bone lesions to suggest obvious disease progression. Patient prefers to take a conservative approach and continue to monitor the neck lymph nodes at this time. -She notes no overt toxicities from her current dose of Revlimid and carfilzomib other than grade 1 fatigue. -Continue reduced dose Revlimid at 5 mg 3 weeks on 1 week off -Continue carfilzomib 36 mg/m every 2 weeks for maintenance Continue Eliquis 2.5 mg p.o. twice daily for VTE prophylaxis Continue Zometa every 4 weeks   FOLLOW UP: Please schedule next 6 doses of maintenance carfilzomib every 2 weeks with port flush and labs.   MD visit in 8 weeks the total time spent in the appointment was 31 minutes*.  All of the patient's questions were answered with apparent satisfaction. The patient knows to call the clinic with any problems, questions or concerns.   Sullivan Lone MD MS AAHIVMS [facility] Hematology/Oncology Physician [facility]  .*Total Encounter Time as defined by the Centers for Medicare and Medicaid Services includes, in addition to the face-to-face time of a patient visit (documented in the note above) non-face-to-face time: obtaining and reviewing outside history, ordering and reviewing medications, tests or procedures, care coordination (communications with other health care professionals or caregivers) and documentation in the medical record.

## 2022-07-28 ENCOUNTER — Other Ambulatory Visit: Payer: Self-pay | Admitting: Internal Medicine

## 2022-07-30 ENCOUNTER — Inpatient Hospital Stay: Payer: Medicare PPO

## 2022-07-30 ENCOUNTER — Other Ambulatory Visit: Payer: Self-pay

## 2022-07-30 ENCOUNTER — Other Ambulatory Visit: Payer: Self-pay | Admitting: Hematology

## 2022-07-30 VITALS — BP 131/69 | HR 73 | Temp 98.3°F | Resp 16 | Wt 150.8 lb

## 2022-07-30 DIAGNOSIS — Z5112 Encounter for antineoplastic immunotherapy: Secondary | ICD-10-CM | POA: Diagnosis not present

## 2022-07-30 DIAGNOSIS — C9000 Multiple myeloma not having achieved remission: Secondary | ICD-10-CM | POA: Diagnosis not present

## 2022-07-30 DIAGNOSIS — Z79899 Other long term (current) drug therapy: Secondary | ICD-10-CM | POA: Diagnosis not present

## 2022-07-30 DIAGNOSIS — Z7189 Other specified counseling: Secondary | ICD-10-CM

## 2022-07-30 DIAGNOSIS — Z95828 Presence of other vascular implants and grafts: Secondary | ICD-10-CM

## 2022-07-30 LAB — CMP (CANCER CENTER ONLY)
ALT: 12 U/L (ref 0–44)
AST: 21 U/L (ref 15–41)
Albumin: 3.4 g/dL — ABNORMAL LOW (ref 3.5–5.0)
Alkaline Phosphatase: 88 U/L (ref 38–126)
Anion gap: 8 (ref 5–15)
BUN: 15 mg/dL (ref 8–23)
CO2: 20 mmol/L — ABNORMAL LOW (ref 22–32)
Calcium: 9.2 mg/dL (ref 8.9–10.3)
Chloride: 110 mmol/L (ref 98–111)
Creatinine: 0.91 mg/dL (ref 0.44–1.00)
GFR, Estimated: >60 mL/min (ref >60)
Glucose, Bld: 205 mg/dL — ABNORMAL HIGH (ref 70–99)
Potassium: 3.9 mmol/L (ref 3.5–5.1)
Sodium: 138 mmol/L (ref 135–145)
Total Bilirubin: 0.6 mg/dL (ref 0.3–1.2)
Total Protein: 6.7 g/dL (ref 6.5–8.1)

## 2022-07-30 LAB — CBC WITH DIFFERENTIAL (CANCER CENTER ONLY)
Abs Immature Granulocytes: 0.01 10*3/uL (ref 0.00–0.07)
Basophils Absolute: 0 10*3/uL (ref 0.0–0.1)
Basophils Relative: 1 %
Eosinophils Absolute: 0.1 10*3/uL (ref 0.0–0.5)
Eosinophils Relative: 2 %
HCT: 35.3 % — ABNORMAL LOW (ref 36.0–46.0)
Hemoglobin: 11.9 g/dL — ABNORMAL LOW (ref 12.0–15.0)
Immature Granulocytes: 0 %
Lymphocytes Relative: 23 %
Lymphs Abs: 0.8 10*3/uL (ref 0.7–4.0)
MCH: 33.9 pg (ref 26.0–34.0)
MCHC: 33.7 g/dL (ref 30.0–36.0)
MCV: 100.6 fL — ABNORMAL HIGH (ref 80.0–100.0)
Monocytes Absolute: 0.4 10*3/uL (ref 0.1–1.0)
Monocytes Relative: 11 %
Neutro Abs: 2.2 10*3/uL (ref 1.7–7.7)
Neutrophils Relative %: 63 %
Platelet Count: 163 10*3/uL (ref 150–400)
RBC: 3.51 MIL/uL — ABNORMAL LOW (ref 3.87–5.11)
RDW: 14.9 % (ref 11.5–15.5)
WBC Count: 3.5 10*3/uL — ABNORMAL LOW (ref 4.0–10.5)
nRBC: 0 % (ref 0.0–0.2)

## 2022-07-30 MED ORDER — SODIUM CHLORIDE 0.9% FLUSH
10.0000 mL | Freq: Once | INTRAVENOUS | Status: AC
  Administered 2022-07-30: 10 mL

## 2022-07-30 MED ORDER — ACETAMINOPHEN 325 MG PO TABS
650.0000 mg | ORAL_TABLET | Freq: Once | ORAL | Status: AC
  Administered 2022-07-30: 650 mg via ORAL
  Filled 2022-07-30: qty 2

## 2022-07-30 MED ORDER — DEXTROSE 5 % IV SOLN
33.0000 mg/m2 | Freq: Once | INTRAVENOUS | Status: AC
  Administered 2022-07-30: 60 mg via INTRAVENOUS
  Filled 2022-07-30: qty 30

## 2022-07-30 MED ORDER — SODIUM CHLORIDE 0.9 % IV SOLN: Freq: Once | INTRAVENOUS | Status: AC

## 2022-07-30 MED ORDER — PROCHLORPERAZINE MALEATE 10 MG PO TABS
10.0000 mg | ORAL_TABLET | Freq: Once | ORAL | Status: AC
  Administered 2022-07-30: 10 mg via ORAL
  Filled 2022-07-30: qty 1

## 2022-07-30 MED ORDER — SODIUM CHLORIDE 0.9% FLUSH
10.0000 mL | INTRAVENOUS | Status: DC | PRN
  Administered 2022-07-30: 10 mL

## 2022-07-30 MED ORDER — FAMOTIDINE 20 MG PO TABS
20.0000 mg | ORAL_TABLET | Freq: Once | ORAL | Status: AC
  Administered 2022-07-30: 20 mg via ORAL
  Filled 2022-07-30: qty 1

## 2022-07-30 MED ORDER — DIPHENHYDRAMINE HCL 25 MG PO CAPS
25.0000 mg | ORAL_CAPSULE | Freq: Once | ORAL | Status: AC
  Administered 2022-07-30: 25 mg via ORAL
  Filled 2022-07-30: qty 1

## 2022-07-30 MED ORDER — HEPARIN SOD (PORK) LOCK FLUSH 100 UNIT/ML IV SOLN
500.0000 [IU] | Freq: Once | INTRAVENOUS | Status: AC | PRN
  Administered 2022-07-30: 500 [IU]

## 2022-07-30 MED ORDER — DEXAMETHASONE 4 MG PO TABS
12.0000 mg | ORAL_TABLET | Freq: Once | ORAL | Status: AC
  Administered 2022-07-30: 12 mg via ORAL
  Filled 2022-07-30: qty 3

## 2022-07-30 MED ORDER — ZOLEDRONIC ACID 4 MG/100ML IV SOLN: 4.0000 mg | Freq: Once | INTRAVENOUS | Status: DC

## 2022-07-30 NOTE — Patient Instructions (Signed)
[facility] MEDICAL ONCOLOGY  Discharge Instructions: Thank you for choosing [facility] to provide your oncology and hematology care.   If you have a lab appointment with the Cancer Center, please go directly to the Cancer Center and check in at the registration area.   Wear comfortable clothing and clothing appropriate for easy access to any Portacath or PICC line.   We strive to give you quality time with your provider. You may need to reschedule your appointment if you arrive late (15 or more minutes).  Arriving late affects you and other patients whose appointments are after yours.  Also, if you miss three or more appointments without notifying the office, you may be dismissed from the clinic at the provider's discretion.      For prescription refill requests, have your pharmacy contact our office and allow 72 hours for refills to be completed.    Today you received the following chemotherapy and/or immunotherapy agents: Kyprolis.       To help prevent nausea and vomiting after your treatment, we encourage you to take your nausea medication as directed.  BELOW ARE SYMPTOMS THAT SHOULD BE REPORTED IMMEDIATELY: *FEVER GREATER THAN 100.4 F (38 C) OR HIGHER *CHILLS OR SWEATING *NAUSEA AND VOMITING THAT IS NOT CONTROLLED WITH YOUR NAUSEA MEDICATION *UNUSUAL SHORTNESS OF BREATH *UNUSUAL BRUISING OR BLEEDING *URINARY PROBLEMS (pain or burning when urinating, or frequent urination) *BOWEL PROBLEMS (unusual diarrhea, constipation, pain near the anus) TENDERNESS IN MOUTH AND THROAT WITH OR WITHOUT PRESENCE OF ULCERS (sore throat, sores in mouth, or a toothache) UNUSUAL RASH, SWELLING OR PAIN  UNUSUAL VAGINAL DISCHARGE OR ITCHING   Items with * indicate a potential emergency and should be followed up as soon as possible or go to the Emergency Department if any problems should occur.  Please show the CHEMOTHERAPY ALERT CARD or IMMUNOTHERAPY ALERT CARD at check-in to  the Emergency Department and triage nurse.  Should you have questions after your visit or need to cancel or reschedule your appointment, please contact [facility] MEDICAL ONCOLOGY  Dept: [phone]  and follow the prompts.  Office hours are 8:00 a.m. to 4:30 p.m. Monday - Friday. Please note that voicemails left after 4:00 p.m. may not be returned until the following business day.  We are closed weekends and major holidays. You have access to a nurse at all times for urgent questions. Please call the main number to the clinic Dept: [phone] and follow the prompts.   For any non-urgent questions, you may also contact your provider using MyChart. We now offer e-Visits for anyone 18 and older to request care online for non-urgent symptoms. For details visit [email or web address].   Also download the MyChart app! Go to the app store, search "MyChart", open the app, select [facility], and log in with your MyChart username and password.  Masks are optional in the cancer centers. If you would like for your care team to wear a mask while they are taking care of you, please let them know. You may have one support person who is at least 79 years old accompany you for your appointments. 

## 2022-07-31 ENCOUNTER — Other Ambulatory Visit: Payer: Medicare PPO

## 2022-07-31 ENCOUNTER — Other Ambulatory Visit: Payer: Self-pay | Admitting: Internal Medicine

## 2022-07-31 ENCOUNTER — Ambulatory Visit: Payer: Medicare PPO

## 2022-08-03 ENCOUNTER — Other Ambulatory Visit: Payer: Self-pay | Admitting: Internal Medicine

## 2022-08-06 ENCOUNTER — Encounter: Payer: Self-pay | Admitting: Internal Medicine

## 2022-08-11 ENCOUNTER — Other Ambulatory Visit: Payer: Self-pay | Admitting: Hematology

## 2022-08-11 DIAGNOSIS — C9000 Multiple myeloma not having achieved remission: Secondary | ICD-10-CM

## 2022-08-12 ENCOUNTER — Encounter: Payer: Self-pay | Admitting: Hematology

## 2022-08-12 ENCOUNTER — Other Ambulatory Visit: Payer: Self-pay

## 2022-08-13 ENCOUNTER — Telehealth (INDEPENDENT_AMBULATORY_CARE_PROVIDER_SITE_OTHER): Payer: Medicare PPO | Admitting: Internal Medicine

## 2022-08-13 ENCOUNTER — Other Ambulatory Visit: Payer: Self-pay

## 2022-08-13 ENCOUNTER — Inpatient Hospital Stay: Payer: Medicare PPO

## 2022-08-13 ENCOUNTER — Other Ambulatory Visit: Payer: Self-pay | Admitting: Hematology

## 2022-08-13 VITALS — BP 120/63 | HR 74 | Temp 98.2°F | Resp 17 | Wt 150.5 lb

## 2022-08-13 DIAGNOSIS — F320 Major depressive disorder, single episode, mild: Secondary | ICD-10-CM

## 2022-08-13 DIAGNOSIS — Z5112 Encounter for antineoplastic immunotherapy: Secondary | ICD-10-CM | POA: Diagnosis not present

## 2022-08-13 DIAGNOSIS — Z95828 Presence of other vascular implants and grafts: Secondary | ICD-10-CM

## 2022-08-13 DIAGNOSIS — E118 Type 2 diabetes mellitus with unspecified complications: Secondary | ICD-10-CM

## 2022-08-13 DIAGNOSIS — Z79899 Other long term (current) drug therapy: Secondary | ICD-10-CM | POA: Diagnosis not present

## 2022-08-13 DIAGNOSIS — E785 Hyperlipidemia, unspecified: Secondary | ICD-10-CM | POA: Diagnosis not present

## 2022-08-13 DIAGNOSIS — C9000 Multiple myeloma not having achieved remission: Secondary | ICD-10-CM | POA: Diagnosis not present

## 2022-08-13 DIAGNOSIS — E1169 Type 2 diabetes mellitus with other specified complication: Secondary | ICD-10-CM | POA: Diagnosis not present

## 2022-08-13 DIAGNOSIS — Z7189 Other specified counseling: Secondary | ICD-10-CM

## 2022-08-13 LAB — CBC WITH DIFFERENTIAL (CANCER CENTER ONLY)
Abs Immature Granulocytes: 0.01 10*3/uL (ref 0.00–0.07)
Basophils Absolute: 0 10*3/uL (ref 0.0–0.1)
Basophils Relative: 0 %
Eosinophils Absolute: 0.2 10*3/uL (ref 0.0–0.5)
Eosinophils Relative: 5 %
HCT: 36 % (ref 36.0–46.0)
Hemoglobin: 12.1 g/dL (ref 12.0–15.0)
Immature Granulocytes: 0 %
Lymphocytes Relative: 26 %
Lymphs Abs: 1 10*3/uL (ref 0.7–4.0)
MCH: 33.6 pg (ref 26.0–34.0)
MCHC: 33.6 g/dL (ref 30.0–36.0)
MCV: 100 fL (ref 80.0–100.0)
Monocytes Absolute: 0.8 10*3/uL (ref 0.1–1.0)
Monocytes Relative: 21 %
Neutro Abs: 1.8 10*3/uL (ref 1.7–7.7)
Neutrophils Relative %: 48 %
Platelet Count: 112 10*3/uL — ABNORMAL LOW (ref 150–400)
RBC: 3.6 MIL/uL — ABNORMAL LOW (ref 3.87–5.11)
RDW: 14.7 % (ref 11.5–15.5)
WBC Count: 3.8 10*3/uL — ABNORMAL LOW (ref 4.0–10.5)
nRBC: 0 % (ref 0.0–0.2)

## 2022-08-13 LAB — CMP (CANCER CENTER ONLY)
ALT: 11 U/L (ref 0–44)
AST: 11 U/L — ABNORMAL LOW (ref 15–41)
Albumin: 3.8 g/dL (ref 3.5–5.0)
Alkaline Phosphatase: 89 U/L (ref 38–126)
Anion gap: 8 (ref 5–15)
BUN: 9 mg/dL (ref 8–23)
CO2: 20 mmol/L — ABNORMAL LOW (ref 22–32)
Calcium: 8.9 mg/dL (ref 8.9–10.3)
Chloride: 109 mmol/L (ref 98–111)
Creatinine: 1.01 mg/dL — ABNORMAL HIGH (ref 0.44–1.00)
GFR, Estimated: 57 mL/min — ABNORMAL LOW (ref >60)
Glucose, Bld: 250 mg/dL — ABNORMAL HIGH (ref 70–99)
Potassium: 3.1 mmol/L — ABNORMAL LOW (ref 3.5–5.1)
Sodium: 137 mmol/L (ref 135–145)
Total Bilirubin: 0.7 mg/dL (ref 0.3–1.2)
Total Protein: 6.3 g/dL — ABNORMAL LOW (ref 6.5–8.1)

## 2022-08-13 MED ORDER — ACETAMINOPHEN 325 MG PO TABS
650.0000 mg | ORAL_TABLET | Freq: Once | ORAL | Status: AC
  Administered 2022-08-13: 650 mg via ORAL
  Filled 2022-08-13: qty 2

## 2022-08-13 MED ORDER — ZOLEDRONIC ACID 4 MG/100ML IV SOLN
4.0000 mg | Freq: Once | INTRAVENOUS | Status: AC
  Administered 2022-08-13: 4 mg via INTRAVENOUS
  Filled 2022-08-13: qty 100

## 2022-08-13 MED ORDER — HEPARIN SOD (PORK) LOCK FLUSH 100 UNIT/ML IV SOLN
500.0000 [IU] | Freq: Once | INTRAVENOUS | Status: AC | PRN
  Administered 2022-08-13: 500 [IU]

## 2022-08-13 MED ORDER — SODIUM CHLORIDE 0.9 % IV SOLN: Freq: Once | INTRAVENOUS | Status: AC

## 2022-08-13 MED ORDER — SODIUM CHLORIDE 0.9% FLUSH
10.0000 mL | INTRAVENOUS | Status: DC | PRN
  Administered 2022-08-13: 10 mL

## 2022-08-13 MED ORDER — SODIUM CHLORIDE 0.9% FLUSH
10.0000 mL | Freq: Once | INTRAVENOUS | Status: AC
  Administered 2022-08-13: 10 mL

## 2022-08-13 MED ORDER — DEXTROSE 5 % IV SOLN
60.0000 mg | Freq: Once | INTRAVENOUS | Status: AC
  Administered 2022-08-13: 60 mg via INTRAVENOUS
  Filled 2022-08-13: qty 30

## 2022-08-13 MED ORDER — PROCHLORPERAZINE MALEATE 10 MG PO TABS
10.0000 mg | ORAL_TABLET | Freq: Once | ORAL | Status: AC
  Administered 2022-08-13: 10 mg via ORAL
  Filled 2022-08-13: qty 1

## 2022-08-13 MED ORDER — DIPHENHYDRAMINE HCL 25 MG PO CAPS
25.0000 mg | ORAL_CAPSULE | Freq: Once | ORAL | Status: AC
  Administered 2022-08-13: 25 mg via ORAL
  Filled 2022-08-13: qty 1

## 2022-08-13 MED ORDER — SERTRALINE HCL 100 MG PO TABS: 100.0000 mg | ORAL_TABLET | Freq: Every day | ORAL | 3 refills | Status: DC

## 2022-08-13 MED ORDER — FAMOTIDINE 20 MG PO TABS
20.0000 mg | ORAL_TABLET | Freq: Once | ORAL | Status: AC
  Administered 2022-08-13: 20 mg via ORAL
  Filled 2022-08-13: qty 1

## 2022-08-13 MED ORDER — DEXAMETHASONE 4 MG PO TABS
12.0000 mg | ORAL_TABLET | Freq: Once | ORAL | Status: AC
  Administered 2022-08-13: 12 mg via ORAL
  Filled 2022-08-13: qty 3

## 2022-08-13 MED ORDER — SODIUM CHLORIDE 0.9 % IV SOLN: Freq: Once | INTRAVENOUS | Status: DC

## 2022-08-13 NOTE — Progress Notes (Signed)
Virtual Visit via Video Note  I connected with Kem Kays on 08/13/22 at 11:20 AM EDT by a video enabled telemedicine application and verified that I am speaking with the correct person using two identifiers.  The patient and the provider were at separate locations throughout the entire encounter. Patient location: home, Provider location: work   I discussed the limitations of evaluation and management by telemedicine and the availability of in person appointments. The patient expressed understanding and agreed to proceed. The patient and the provider were the only parties present for the visit unless noted in HPI below.  History of Present Illness: The patient is a 81 y.o. female with visit for follow up.  Observations/Objective: Appearance: normal, breathing appears normal, casual grooming, abdomen does not appear distended  Assessment and Plan: See problem oriented charting  Follow Up Instructions: refill sertraline  I discussed the assessment and treatment plan with the patient. The patient was provided an opportunity to ask questions and all were answered. The patient agreed with the plan and demonstrated an understanding of the instructions.   The patient was advised to call back or seek an in-person evaluation if the symptoms worsen or if the condition fails to improve as anticipated.  Hoyt Koch, MD

## 2022-08-13 NOTE — Patient Instructions (Signed)
[facility]  Discharge Instructions: Thank you for choosing [facility] to provide your oncology and hematology care.   If you have a lab appointment with the Cancer Center, please go directly to the Cancer Center and check in at the registration area.   Wear comfortable clothing and clothing appropriate for easy access to any Portacath or PICC line.   We strive to give you quality time with your provider. You may need to reschedule your appointment if you arrive late (15 or more minutes).  Arriving late affects you and other patients whose appointments are after yours.  Also, if you miss three or more appointments without notifying the office, you may be dismissed from the clinic at the provider's discretion.      For prescription refill requests, have your pharmacy contact our office and allow 72 hours for refills to be completed.    Today you received the following chemotherapy and/or immunotherapy agents: Kyprolis.       To help prevent nausea and vomiting after your treatment, we encourage you to take your nausea medication as directed.  BELOW ARE SYMPTOMS THAT SHOULD BE REPORTED IMMEDIATELY: *FEVER GREATER THAN 100.4 F (38 C) OR HIGHER *CHILLS OR SWEATING *NAUSEA AND VOMITING THAT IS NOT CONTROLLED WITH YOUR NAUSEA MEDICATION *UNUSUAL SHORTNESS OF BREATH *UNUSUAL BRUISING OR BLEEDING *URINARY PROBLEMS (pain or burning when urinating, or frequent urination) *BOWEL PROBLEMS (unusual diarrhea, constipation, pain near the anus) TENDERNESS IN MOUTH AND THROAT WITH OR WITHOUT PRESENCE OF ULCERS (sore throat, sores in mouth, or a toothache) UNUSUAL RASH, SWELLING OR PAIN  UNUSUAL VAGINAL DISCHARGE OR ITCHING   Items with * indicate a potential emergency and should be followed up as soon as possible or go to the Emergency Department if any problems should occur.  Please show the CHEMOTHERAPY ALERT CARD or IMMUNOTHERAPY ALERT CARD at check-in to  the Emergency Department and triage nurse.  Should you have questions after your visit or need to cancel or reschedule your appointment, please contact [facility]  Dept: [phone]  and follow the prompts.  Office hours are 8:00 a.m. to 4:30 p.m. Monday - Friday. Please note that voicemails left after 4:00 p.m. may not be returned until the following business day.  We are closed weekends and major holidays. You have access to a nurse at all times for urgent questions. Please call the main number to the clinic Dept: [phone] and follow the prompts.   For any non-urgent questions, you may also contact your provider using MyChart. We now offer e-Visits for anyone 18 and older to request care online for non-urgent symptoms. For details visit [email or web address].   Also download the MyChart app! Go to the app store, search "MyChart", open the app, select [facility], and log in with your MyChart username and password.  Masks are optional in the cancer centers. If you would like for your care team to wear a mask while they are taking care of you, please let them know. You may have one support [place] Folden who is at least 79 years old accompany you for your appointments. 

## 2022-08-14 ENCOUNTER — Encounter: Payer: Self-pay | Admitting: Internal Medicine

## 2022-08-14 ENCOUNTER — Other Ambulatory Visit: Payer: Self-pay

## 2022-08-14 NOTE — Assessment & Plan Note (Signed)
Previously at goal and will recheck lipids with next in person visit. Taking simvastatin 20 mg daily.

## 2022-08-14 NOTE — Assessment & Plan Note (Signed)
Previously at goal and will check labs at next visit. Taking metformin 1500 mg daily.

## 2022-08-14 NOTE — Assessment & Plan Note (Signed)
Controlled on sertraline 100 mg daily and will continue. Refilled.

## 2022-08-17 ENCOUNTER — Other Ambulatory Visit: Payer: Self-pay | Admitting: Hematology

## 2022-08-17 DIAGNOSIS — C9000 Multiple myeloma not having achieved remission: Secondary | ICD-10-CM

## 2022-08-18 ENCOUNTER — Other Ambulatory Visit: Payer: Self-pay

## 2022-08-18 NOTE — Telephone Encounter (Signed)
Refill already done by Dr. Sharlet Salina 08/13/22.Marland KitchenJohny Chess

## 2022-08-19 ENCOUNTER — Encounter: Payer: Self-pay | Admitting: Hematology

## 2022-08-19 MED ORDER — OXYCODONE HCL 10 MG PO TABS: 10.0000 mg | ORAL_TABLET | Freq: Four times a day (QID) | ORAL | 0 refills | Status: DC | PRN

## 2022-08-22 ENCOUNTER — Other Ambulatory Visit: Payer: Self-pay | Admitting: Internal Medicine

## 2022-08-25 DIAGNOSIS — H04123 Dry eye syndrome of bilateral lacrimal glands: Secondary | ICD-10-CM | POA: Diagnosis not present

## 2022-08-25 DIAGNOSIS — H40013 Open angle with borderline findings, low risk, bilateral: Secondary | ICD-10-CM | POA: Diagnosis not present

## 2022-08-25 DIAGNOSIS — H353132 Nonexudative age-related macular degeneration, bilateral, intermediate dry stage: Secondary | ICD-10-CM | POA: Diagnosis not present

## 2022-08-25 DIAGNOSIS — E119 Type 2 diabetes mellitus without complications: Secondary | ICD-10-CM | POA: Diagnosis not present

## 2022-08-25 DIAGNOSIS — H524 Presbyopia: Secondary | ICD-10-CM | POA: Diagnosis not present

## 2022-08-25 LAB — HM DIABETES EYE EXAM

## 2022-08-28 ENCOUNTER — Inpatient Hospital Stay: Payer: Medicare PPO | Attending: Hematology

## 2022-08-28 ENCOUNTER — Other Ambulatory Visit: Payer: Self-pay | Admitting: Internal Medicine

## 2022-08-28 ENCOUNTER — Inpatient Hospital Stay: Payer: Medicare PPO

## 2022-08-28 VITALS — BP 127/65 | HR 76 | Temp 97.9°F | Resp 18 | Wt 150.5 lb

## 2022-08-28 DIAGNOSIS — C9000 Multiple myeloma not having achieved remission: Secondary | ICD-10-CM

## 2022-08-28 DIAGNOSIS — Z7189 Other specified counseling: Secondary | ICD-10-CM

## 2022-08-28 DIAGNOSIS — Z95828 Presence of other vascular implants and grafts: Secondary | ICD-10-CM

## 2022-08-28 DIAGNOSIS — Z5112 Encounter for antineoplastic immunotherapy: Secondary | ICD-10-CM | POA: Insufficient documentation

## 2022-08-28 DIAGNOSIS — Z79899 Other long term (current) drug therapy: Secondary | ICD-10-CM | POA: Diagnosis not present

## 2022-08-28 LAB — CBC WITH DIFFERENTIAL (CANCER CENTER ONLY)
Abs Immature Granulocytes: 0.02 10*3/uL (ref 0.00–0.07)
Basophils Absolute: 0 10*3/uL (ref 0.0–0.1)
Basophils Relative: 1 %
Eosinophils Absolute: 0.1 10*3/uL (ref 0.0–0.5)
Eosinophils Relative: 2 %
HCT: 35.9 % — ABNORMAL LOW (ref 36.0–46.0)
Hemoglobin: 12.2 g/dL (ref 12.0–15.0)
Immature Granulocytes: 1 %
Lymphocytes Relative: 21 %
Lymphs Abs: 0.9 10*3/uL (ref 0.7–4.0)
MCH: 34.1 pg — ABNORMAL HIGH (ref 26.0–34.0)
MCHC: 34 g/dL (ref 30.0–36.0)
MCV: 100.3 fL — ABNORMAL HIGH (ref 80.0–100.0)
Monocytes Absolute: 0.4 10*3/uL (ref 0.1–1.0)
Monocytes Relative: 9 %
Neutro Abs: 2.9 10*3/uL (ref 1.7–7.7)
Neutrophils Relative %: 66 %
Platelet Count: 150 10*3/uL (ref 150–400)
RBC: 3.58 MIL/uL — ABNORMAL LOW (ref 3.87–5.11)
RDW: 14.7 % (ref 11.5–15.5)
WBC Count: 4.3 10*3/uL (ref 4.0–10.5)
nRBC: 0 % (ref 0.0–0.2)

## 2022-08-28 LAB — CMP (CANCER CENTER ONLY)
ALT: 13 U/L (ref 0–44)
AST: 16 U/L (ref 15–41)
Albumin: 3.4 g/dL — ABNORMAL LOW (ref 3.5–5.0)
Alkaline Phosphatase: 85 U/L (ref 38–126)
Anion gap: 7 (ref 5–15)
BUN: 13 mg/dL (ref 8–23)
CO2: 18 mmol/L — ABNORMAL LOW (ref 22–32)
Calcium: 9.4 mg/dL (ref 8.9–10.3)
Chloride: 115 mmol/L — ABNORMAL HIGH (ref 98–111)
Creatinine: 1.1 mg/dL — ABNORMAL HIGH (ref 0.44–1.00)
GFR, Estimated: 51 mL/min — ABNORMAL LOW (ref >60)
Glucose, Bld: 227 mg/dL — ABNORMAL HIGH (ref 70–99)
Potassium: 3.4 mmol/L — ABNORMAL LOW (ref 3.5–5.1)
Sodium: 140 mmol/L (ref 135–145)
Total Bilirubin: 0.5 mg/dL (ref 0.3–1.2)
Total Protein: 6.4 g/dL — ABNORMAL LOW (ref 6.5–8.1)

## 2022-08-28 MED ORDER — DIPHENHYDRAMINE HCL 25 MG PO CAPS
25.0000 mg | ORAL_CAPSULE | Freq: Once | ORAL | Status: AC
  Administered 2022-08-28: 25 mg via ORAL
  Filled 2022-08-28 (×2): qty 1

## 2022-08-28 MED ORDER — SODIUM CHLORIDE 0.9% FLUSH
10.0000 mL | INTRAVENOUS | Status: DC | PRN
  Administered 2022-08-28: 10 mL

## 2022-08-28 MED ORDER — DEXTROSE 5 % IV SOLN
33.0000 mg/m2 | Freq: Once | INTRAVENOUS | Status: AC
  Administered 2022-08-28: 60 mg via INTRAVENOUS
  Filled 2022-08-28: qty 30

## 2022-08-28 MED ORDER — HEPARIN SOD (PORK) LOCK FLUSH 100 UNIT/ML IV SOLN
500.0000 [IU] | Freq: Once | INTRAVENOUS | Status: AC | PRN
  Administered 2022-08-28: 500 [IU]

## 2022-08-28 MED ORDER — SODIUM CHLORIDE 0.9% FLUSH
10.0000 mL | Freq: Once | INTRAVENOUS | Status: AC
  Administered 2022-08-28: 10 mL

## 2022-08-28 MED ORDER — SODIUM CHLORIDE 0.9 % IV SOLN: Freq: Once | INTRAVENOUS | Status: AC

## 2022-08-28 MED ORDER — PROCHLORPERAZINE MALEATE 10 MG PO TABS
10.0000 mg | ORAL_TABLET | Freq: Once | ORAL | Status: AC
  Administered 2022-08-28: 10 mg via ORAL
  Filled 2022-08-28: qty 1

## 2022-08-28 MED ORDER — DEXAMETHASONE 4 MG PO TABS
12.0000 mg | ORAL_TABLET | Freq: Once | ORAL | Status: AC
  Administered 2022-08-28: 12 mg via ORAL
  Filled 2022-08-28: qty 3

## 2022-08-28 MED ORDER — ACETAMINOPHEN 325 MG PO TABS
650.0000 mg | ORAL_TABLET | Freq: Once | ORAL | Status: AC
  Administered 2022-08-28: 650 mg via ORAL
  Filled 2022-08-28: qty 2

## 2022-08-28 MED ORDER — FAMOTIDINE 20 MG PO TABS
20.0000 mg | ORAL_TABLET | Freq: Once | ORAL | Status: AC
  Administered 2022-08-28: 20 mg via ORAL
  Filled 2022-08-28: qty 1

## 2022-08-28 NOTE — Patient Instructions (Signed)
[facility]  Discharge Instructions: Thank you for choosing [facility] to provide your oncology and hematology care.   If you have a lab appointment with the Cancer Center, please go directly to the Cancer Center and check in at the registration area.   Wear comfortable clothing and clothing appropriate for easy access to any Portacath or PICC line.   We strive to give you quality time with your provider. You may need to reschedule your appointment if you arrive late (15 or more minutes).  Arriving late affects you and other patients whose appointments are after yours.  Also, if you miss three or more appointments without notifying the office, you may be dismissed from the clinic at the provider's discretion.      For prescription refill requests, have your pharmacy contact our office and allow 72 hours for refills to be completed.    Today you received the following chemotherapy and/or immunotherapy agents: Kyprolis.       To help prevent nausea and vomiting after your treatment, we encourage you to take your nausea medication as directed.  BELOW ARE SYMPTOMS THAT SHOULD BE REPORTED IMMEDIATELY: *FEVER GREATER THAN 100.4 F (38 C) OR HIGHER *CHILLS OR SWEATING *NAUSEA AND VOMITING THAT IS NOT CONTROLLED WITH YOUR NAUSEA MEDICATION *UNUSUAL SHORTNESS OF BREATH *UNUSUAL BRUISING OR BLEEDING *URINARY PROBLEMS (pain or burning when urinating, or frequent urination) *BOWEL PROBLEMS (unusual diarrhea, constipation, pain near the anus) TENDERNESS IN MOUTH AND THROAT WITH OR WITHOUT PRESENCE OF ULCERS (sore throat, sores in mouth, or a toothache) UNUSUAL RASH, SWELLING OR PAIN  UNUSUAL VAGINAL DISCHARGE OR ITCHING   Items with * indicate a potential emergency and should be followed up as soon as possible or go to the Emergency Department if any problems should occur.  Please show the CHEMOTHERAPY ALERT CARD or IMMUNOTHERAPY ALERT CARD at check-in to  the Emergency Department and triage nurse.  Should you have questions after your visit or need to cancel or reschedule your appointment, please contact [facility]  Dept: [phone]  and follow the prompts.  Office hours are 8:00 a.m. to 4:30 p.m. Monday - Friday. Please note that voicemails left after 4:00 p.m. may not be returned until the following business day.  We are closed weekends and major holidays. You have access to a nurse at all times for urgent questions. Please call the main number to the clinic Dept: [phone] and follow the prompts.   For any non-urgent questions, you may also contact your provider using MyChart. We now offer e-Visits for anyone 18 and older to request care online for non-urgent symptoms. For details visit [email or web address].   Also download the MyChart app! Go to the app store, search "MyChart", open the app, select , and log in with your MyChart username and password.  Masks are optional in the cancer centers. If you would like for your care team to wear a mask while they are taking care of you, please let them know. You may have one support person who is at least 79 years old accompany you for your appointments. 

## 2022-08-31 ENCOUNTER — Encounter: Payer: Self-pay | Admitting: *Deleted

## 2022-09-06 ENCOUNTER — Other Ambulatory Visit (HOSPITAL_COMMUNITY): Payer: Self-pay | Admitting: Hematology

## 2022-09-06 DIAGNOSIS — C7951 Secondary malignant neoplasm of bone: Secondary | ICD-10-CM

## 2022-09-06 DIAGNOSIS — C9000 Multiple myeloma not having achieved remission: Secondary | ICD-10-CM

## 2022-09-07 ENCOUNTER — Encounter: Payer: Self-pay | Admitting: Hematology

## 2022-09-07 ENCOUNTER — Other Ambulatory Visit (HOSPITAL_COMMUNITY): Payer: Self-pay | Admitting: Hematology

## 2022-09-07 DIAGNOSIS — C9000 Multiple myeloma not having achieved remission: Secondary | ICD-10-CM

## 2022-09-09 ENCOUNTER — Other Ambulatory Visit: Payer: Self-pay

## 2022-09-09 ENCOUNTER — Encounter: Payer: Self-pay | Admitting: Hematology

## 2022-09-11 ENCOUNTER — Inpatient Hospital Stay: Payer: Medicare PPO

## 2022-09-11 ENCOUNTER — Inpatient Hospital Stay (HOSPITAL_BASED_OUTPATIENT_CLINIC_OR_DEPARTMENT_OTHER): Payer: Medicare PPO | Admitting: Physician Assistant

## 2022-09-11 ENCOUNTER — Other Ambulatory Visit: Payer: Self-pay

## 2022-09-11 VITALS — BP 108/64 | HR 64 | Temp 98.2°F | Resp 16

## 2022-09-11 DIAGNOSIS — C9000 Multiple myeloma not having achieved remission: Secondary | ICD-10-CM

## 2022-09-11 DIAGNOSIS — Z7189 Other specified counseling: Secondary | ICD-10-CM

## 2022-09-11 DIAGNOSIS — Z95828 Presence of other vascular implants and grafts: Secondary | ICD-10-CM

## 2022-09-11 DIAGNOSIS — Z5112 Encounter for antineoplastic immunotherapy: Secondary | ICD-10-CM | POA: Diagnosis not present

## 2022-09-11 DIAGNOSIS — Z79899 Other long term (current) drug therapy: Secondary | ICD-10-CM | POA: Diagnosis not present

## 2022-09-11 LAB — CBC WITH DIFFERENTIAL (CANCER CENTER ONLY)
Abs Immature Granulocytes: 0.02 10*3/uL (ref 0.00–0.07)
Basophils Absolute: 0 10*3/uL (ref 0.0–0.1)
Basophils Relative: 1 %
Eosinophils Absolute: 0.2 10*3/uL (ref 0.0–0.5)
Eosinophils Relative: 5 %
HCT: 32.8 % — ABNORMAL LOW (ref 36.0–46.0)
Hemoglobin: 11.4 g/dL — ABNORMAL LOW (ref 12.0–15.0)
Immature Granulocytes: 1 %
Lymphocytes Relative: 24 %
Lymphs Abs: 0.8 10*3/uL (ref 0.7–4.0)
MCH: 34.3 pg — ABNORMAL HIGH (ref 26.0–34.0)
MCHC: 34.8 g/dL (ref 30.0–36.0)
MCV: 98.8 fL (ref 80.0–100.0)
Monocytes Absolute: 0.7 10*3/uL (ref 0.1–1.0)
Monocytes Relative: 22 %
Neutro Abs: 1.5 10*3/uL — ABNORMAL LOW (ref 1.7–7.7)
Neutrophils Relative %: 47 %
Platelet Count: 107 10*3/uL — ABNORMAL LOW (ref 150–400)
RBC: 3.32 MIL/uL — ABNORMAL LOW (ref 3.87–5.11)
RDW: 15.8 % — ABNORMAL HIGH (ref 11.5–15.5)
WBC Count: 3.3 10*3/uL — ABNORMAL LOW (ref 4.0–10.5)
nRBC: 0 % (ref 0.0–0.2)

## 2022-09-11 LAB — CMP (CANCER CENTER ONLY)
ALT: 10 U/L (ref 0–44)
AST: 10 U/L — ABNORMAL LOW (ref 15–41)
Albumin: 3.6 g/dL (ref 3.5–5.0)
Alkaline Phosphatase: 93 U/L (ref 38–126)
Anion gap: 6 (ref 5–15)
BUN: 7 mg/dL — ABNORMAL LOW (ref 8–23)
CO2: 20 mmol/L — ABNORMAL LOW (ref 22–32)
Calcium: 7.9 mg/dL — ABNORMAL LOW (ref 8.9–10.3)
Chloride: 112 mmol/L — ABNORMAL HIGH (ref 98–111)
Creatinine: 0.93 mg/dL (ref 0.44–1.00)
GFR, Estimated: >60 mL/min (ref >60)
Glucose, Bld: 199 mg/dL — ABNORMAL HIGH (ref 70–99)
Potassium: 3.3 mmol/L — ABNORMAL LOW (ref 3.5–5.1)
Sodium: 138 mmol/L (ref 135–145)
Total Bilirubin: 0.8 mg/dL (ref 0.3–1.2)
Total Protein: 5.9 g/dL — ABNORMAL LOW (ref 6.5–8.1)

## 2022-09-11 MED ORDER — DEXTROSE 5 % IV SOLN
60.0000 mg | Freq: Once | INTRAVENOUS | Status: AC
  Administered 2022-09-11: 60 mg via INTRAVENOUS
  Filled 2022-09-11: qty 30

## 2022-09-11 MED ORDER — SODIUM CHLORIDE 0.9 % IV SOLN: Freq: Once | INTRAVENOUS | Status: AC

## 2022-09-11 MED ORDER — ONDANSETRON HCL 8 MG PO TABS: 8.0000 mg | ORAL_TABLET | Freq: Two times a day (BID) | ORAL | 1 refills | Status: DC | PRN

## 2022-09-11 MED ORDER — SODIUM CHLORIDE 0.9% FLUSH
10.0000 mL | INTRAVENOUS | Status: DC | PRN
  Administered 2022-09-11: 10 mL

## 2022-09-11 MED ORDER — DIPHENHYDRAMINE HCL 25 MG PO CAPS
25.0000 mg | ORAL_CAPSULE | Freq: Once | ORAL | Status: AC
  Administered 2022-09-11: 25 mg via ORAL
  Filled 2022-09-11: qty 1

## 2022-09-11 MED ORDER — ACETAMINOPHEN 325 MG PO TABS
650.0000 mg | ORAL_TABLET | Freq: Once | ORAL | Status: AC
  Administered 2022-09-11: 650 mg via ORAL
  Filled 2022-09-11: qty 2

## 2022-09-11 MED ORDER — DEXAMETHASONE 4 MG PO TABS
12.0000 mg | ORAL_TABLET | Freq: Once | ORAL | Status: AC
  Administered 2022-09-11: 12 mg via ORAL
  Filled 2022-09-11: qty 3

## 2022-09-11 MED ORDER — PROCHLORPERAZINE MALEATE 10 MG PO TABS
10.0000 mg | ORAL_TABLET | Freq: Once | ORAL | Status: AC
  Administered 2022-09-11: 10 mg via ORAL
  Filled 2022-09-11: qty 1

## 2022-09-11 MED ORDER — FENTANYL 12 MCG/HR TD PT72: 1.0000 | MEDICATED_PATCH | TRANSDERMAL | 0 refills | Status: DC

## 2022-09-11 MED ORDER — HEPARIN SOD (PORK) LOCK FLUSH 100 UNIT/ML IV SOLN
500.0000 [IU] | Freq: Once | INTRAVENOUS | Status: AC | PRN
  Administered 2022-09-11: 500 [IU]

## 2022-09-11 MED ORDER — ZOLEDRONIC ACID 4 MG/100ML IV SOLN
4.0000 mg | Freq: Once | INTRAVENOUS | Status: AC
  Administered 2022-09-11: 4 mg via INTRAVENOUS
  Filled 2022-09-11: qty 100

## 2022-09-11 MED ORDER — FAMOTIDINE 20 MG PO TABS
20.0000 mg | ORAL_TABLET | Freq: Once | ORAL | Status: AC
  Administered 2022-09-11: 20 mg via ORAL
  Filled 2022-09-11: qty 1

## 2022-09-11 NOTE — Patient Instructions (Signed)
[facility]  Discharge Instructions: Thank you for choosing [facility] to provide your oncology and hematology care.   If you have a lab appointment with the Cancer Center, please go directly to the Cancer Center and check in at the registration area.   Wear comfortable clothing and clothing appropriate for easy access to any Portacath or PICC line.   We strive to give you quality time with your provider. You may need to reschedule your appointment if you arrive late (15 or more minutes).  Arriving late affects you and other patients whose appointments are after yours.  Also, if you miss three or more appointments without notifying the office, you may be dismissed from the clinic at the provider's discretion.      For prescription refill requests, have your pharmacy contact our office and allow 72 hours for refills to be completed.    Today you received the following chemotherapy and/or immunotherapy agents: Kyprolis.       To help prevent nausea and vomiting after your treatment, we encourage you to take your nausea medication as directed.  BELOW ARE SYMPTOMS THAT SHOULD BE REPORTED IMMEDIATELY: *FEVER GREATER THAN 100.4 F (38 C) OR HIGHER *CHILLS OR SWEATING *NAUSEA AND VOMITING THAT IS NOT CONTROLLED WITH YOUR NAUSEA MEDICATION *UNUSUAL SHORTNESS OF BREATH *UNUSUAL BRUISING OR BLEEDING *URINARY PROBLEMS (pain or burning when urinating, or frequent urination) *BOWEL PROBLEMS (unusual diarrhea, constipation, pain near the anus) TENDERNESS IN MOUTH AND THROAT WITH OR WITHOUT PRESENCE OF ULCERS (sore throat, sores in mouth, or a toothache) UNUSUAL RASH, SWELLING OR PAIN  UNUSUAL VAGINAL DISCHARGE OR ITCHING   Items with * indicate a potential emergency and should be followed up as soon as possible or go to the Emergency Department if any problems should occur.  Please show the CHEMOTHERAPY ALERT CARD or IMMUNOTHERAPY ALERT CARD at check-in to  the Emergency Department and triage nurse.  Should you have questions after your visit or need to cancel or reschedule your appointment, please contact [facility]  Dept: [phone]  and follow the prompts.  Office hours are 8:00 a.m. to 4:30 p.m. Monday - Friday. Please note that voicemails left after 4:00 p.m. may not be returned until the following business day.  We are closed weekends and major holidays. You have access to a nurse at all times for urgent questions. Please call the main number to the clinic Dept: [phone] and follow the prompts.   For any non-urgent questions, you may also contact your provider using MyChart. We now offer e-Visits for anyone 18 and older to request care online for non-urgent symptoms. For details visit [email or web address].   Also download the MyChart app! Go to the app store, search "MyChart", open the app, select [facility], and log in with your MyChart username and password.  Masks are optional in the cancer centers. If you would like for your care team to wear a mask while they are taking care of you, please let them know. You may have one support person who is at least 79 years old accompany you for your appointments. 

## 2022-09-11 NOTE — Progress Notes (Signed)
HEMATOLOGY/ONCOLOGY CLINIC NOTE  Date of Service: 09/11/2022   Patient Care Team: Hoyt Koch, MD as PCP - General (Internal Medicine) Lafayette Dragon, MD (Inactive) as Consulting Physician (Gastroenterology) Megan Salon, MD as Consulting Physician (Gynecology) Melrose Nakayama, MD as Consulting Physician (Orthopedic Surgery) Deneise Lever, MD as Consulting Physician (Pulmonary Disease) [name], MD (Ophthalmology)  CHIEF COMPLAINTS/PURPOSE OF CONSULTATION:  For continued evaluation and management of multiple myeloma  HISTORY OF PRESENTING ILLNESS:  Please see previous notes for details on initial presentation  Current Treatment: Carfilzomib + Revlimid maintenance.  INTERVAL HISTORY:   Faith Orr is a 81 y.o. female is here for continued evaluation and management of multiple myeloma.  She reports her energy levels are fairly stable.  She does have fatigue but can complete her daily activities on her own.  Her appetite is unchanged and she denies any noticeable weight loss.  She does have more nausea since last visit but symptoms are well controlled with Zofran.  She is unaware of triggers but knows that it is not related to food intake.  She denies any abdominal pain and reports only intermittent episodes of diarrhea.  She denies easy bruising or signs of active bleeding.  Patient reports shooting pain down her legs after receiving Zometa last couple of times.  The pain eventually resolved on its own.  She denies fevers, chills, night sweats, shortness of breath, chest pain or cough.  She has no other complaints.  MEDICAL HISTORY:  Past Medical History:  Diagnosis Date   Allergy    seasonal   Asthma    DEPRESSION    DIABETES MELLITUS, TYPE II    Diverticulosis    HYPERLIPIDEMIA    Macular degeneration of left eye    mild, Dr.Hecker   Obesity, unspecified    Osteoarthritis of both knees    OSTEOPENIA    Osteopenia    URINARY INCONTINENCE      SURGICAL HISTORY: Past Surgical History:  Procedure Laterality Date   CATARACT EXTRACTION Left 05/24/2018   CESAREAN SECTION  01/1973   CYSTOSCOPY/URETEROSCOPY/HOLMIUM LASER/STENT PLACEMENT Right 09/20/2019   Procedure: CYSTOSCOPY/URETEROSCOPY/HOLMIUM LASER/STENT PLACEMENT;  Surgeon: Lucas Mallow, MD;  Location: [facility];  Service: Urology;  Laterality: Right;   FRACTURE SURGERY     IR IMAGING GUIDED PORT INSERTION  02/20/2019   left wrist surgery  2008   By Dr. Latanya Maudlin   right ankle  1994    SOCIAL HISTORY: Social History   Socioeconomic History   Marital status: Married    Spouse name: Not on file   Number of children: 1   Years of education: Not on file   Highest education level: Not on file  Occupational History    Employer: [employer]  Tobacco Use   Smoking status: Never   Smokeless tobacco: Never   Tobacco comments:    Lives with partner [name]) and son  Vaping Use   Vaping Use: Never used  Substance and Sexual Activity   Alcohol use: No    Alcohol/week: 0.0 standard drinks of alcohol   Drug use: No   Sexual activity: Never    Partners: Female    Birth control/protection: Post-menopausal    Comment: Lives with female partner (annette hicks) and 63 yo son  Other Topics Concern   Not on file  Social History Narrative   Not on file   Social Determinants of Health   Financial Resource Strain: Low Risk  (  02/27/2022)   Overall Financial Resource Strain (CARDIA)    Difficulty of Paying Living Expenses: Not hard at all  Food Insecurity: No Food Insecurity (02/27/2022)   Hunger Vital Sign    Worried About Running Out of Food in the Last Year: Never true    Ran Out of Food in the Last Year: Never true  Transportation Needs: No Transportation Needs (02/27/2022)   PRAPARE - Hydrologist (Medical): No    Lack of Transportation (Non-Medical): No  Physical Activity: Unknown (02/27/2022)   Exercise  Vital Sign    Days of Exercise per Week: Patient refused    Minutes of Exercise per Session: Patient refused  Stress: No Stress Concern Present (02/27/2022)   [place]    Feeling of Stress : Not at all  Social Connections: Unknown (02/27/2022)   Social Connection and Isolation Panel [NHANES]    Frequency of Communication with Friends and Family: Patient refused    Frequency of Social Gatherings with Friends and Family: Patient refused    Attends Religious Services: Patient refused    Active Member of Clubs or Organizations: Patient refused    Attends Archivist Meetings: Patient refused    Marital Status: Living with partner  Intimate Partner Violence: Not At Risk (05/08/2021)   Humiliation, Afraid, Rape, and Kick questionnaire    Fear of Current or Ex-Partner: No    Emotionally Abused: No    Physically Abused: No    Sexually Abused: No    FAMILY HISTORY: Family History  Problem Relation Age of Onset   Diabetes Father    Hyperlipidemia Father    Heart disease Father    Cancer Father    Hypertension Father    Colon cancer Paternal Grandmother 88   Osteoporosis Mother    Protein S deficiency Mother    Hyperlipidemia Mother    Multiple sclerosis Daughter    Cancer Other        bladder   Breast cancer Neg Hx     ALLERGIES:  is allergic to levofloxacin, penicillins, aleve [naproxen sodium], and sulfonamide derivatives.  MEDICATIONS:  Current Outpatient Medications  Medication Sig Dispense Refill   acyclovir (ZOVIRAX) 400 MG tablet Take 1 tablet (400 mg total) by mouth 2 (two) times daily. 60 tablet 5   Blood Glucose Monitoring Suppl (FREESTYLE FREEDOM LITE) W/DEVICE KIT Use to check blood sugars twice a day Dx 250.00 1 each 0   Calcium Carbonate-Vitamin D 600-400 MG-UNIT tablet Take 1 tablet by mouth 2 (two) times daily.     dexamethasone (DECADRON) 4 MG tablet Take 5 tablets (20 mg total) by mouth  once a week. On D22 of each cycle of treatment 20 tablet 5   ELIQUIS 2.5 MG TABS tablet TAKE 1 TABLET(2.5 MG) BY MOUTH TWICE DAILY 60 tablet 2   fentaNYL (DURAGESIC) 12 MCG/HR Place 1 patch onto the skin every 3 (three) days. 10 patch 0   fluticasone (FLONASE) 50 MCG/ACT nasal spray Place 1 spray into both nostrils daily. 16 g 2   glucose blood (FREESTYLE LITE) test strip CHECK BLOOD SUGAR TWICE DAILY AS DIRECTED Dx 250.00 180 each 3   Lancets (FREESTYLE) lancets Use twice daily to check sugars. 100 each 11   lenalidomide (REVLIMID) 5 MG capsule TAKE 1 CAPSULE BY MOUTH DAILY  FOR 21 DAYS, THEN 7 DAYS OFF 21 capsule 0   lidocaine-prilocaine (EMLA) cream APPLY 1 APPLICATION TO THE AFFECTED AREA AS  NEEDED. USE PRIOR TO PORT ACCESS 30 g 0   meclizine (ANTIVERT) 25 MG tablet Take 1 tablet (25 mg total) by mouth every 8 (eight) hours as needed for dizziness. 20 tablet 0   metFORMIN (GLUCOPHAGE-XR) 500 MG 24 hr tablet Take 3 tablets (1,500 mg total) by mouth daily. Annual appt due in Jan must see provider for future refills 180 tablet 0   Multiple Vitamins-Minerals (ICAPS) CAPS Take 1 capsule by mouth daily after breakfast.     ondansetron (ZOFRAN) 8 MG tablet Take 1 tablet (8 mg total) by mouth 2 (two) times daily as needed (Nausea or vomiting). 30 tablet 1   Oxycodone HCl 10 MG TABS Take 1 tablet (10 mg total) by mouth every 6 (six) hours as needed. 90 tablet 0   polyethylene glycol (MIRALAX / GLYCOLAX) packet Take 17 g by mouth daily after breakfast.     potassium chloride SA (KLOR-CON M) 20 MEQ tablet TAKE 2 TABLETS BY MOUTH TWICE DAILY 360 tablet 1   prochlorperazine (COMPAZINE) 10 MG tablet Take 1 tablet (10 mg total) by mouth every 6 (six) hours as needed (Nausea or vomiting). 30 tablet 1   senna-docusate (SENNA S) 8.6-50 MG tablet Take 2 tablets by mouth at bedtime. 60 tablet 2   sertraline (ZOLOFT) 100 MG tablet Take 1 tablet (100 mg total) by mouth daily. 90 tablet 3   simvastatin (ZOCOR) 20 MG  tablet TAKE 1 TABLET BY MOUTH EVERY DAY AT 6 PM Follow-up appt due must see provider for future refills 90 tablet 0   Vitamin D, Ergocalciferol, (DRISDOL) 1.25 MG (50000 UNIT) CAPS capsule TAKE 1 CAPSULE BY MOUTH EVERY 7 DAYS 12 capsule 0   No current facility-administered medications for this visit.   Facility-Administered Medications Ordered in Other Visits  Medication Dose Route Frequency Provider Last Rate Last Admin   heparin lock flush 100 unit/mL  500 Units Intracatheter Once PRN Brunetta Genera, MD       sodium chloride flush (NS) 0.9 % injection 10 mL  10 mL Intracatheter PRN Brunetta Genera, MD   10 mL at 10/02/19 1524    REVIEW OF SYSTEMS:   10 Point review of Systems was done is negative except as noted above.  PHYSICAL EXAMINATION: ECOG FS:2 - Symptomatic, <50% confined to bed  Vitals:   09/11/22 1242  BP: (!) 114/54  Pulse: 77  Resp: 17  Temp: 97.7 F (36.5 C)  SpO2: 99%    Wt Readings from Last 3 Encounters:  09/11/22 151 lb 9.6 oz (68.8 kg)  08/28/22 150 lb 8 oz (68.3 kg)  08/13/22 150 lb 8 oz (68.3 kg)   Body mass index is 30.62 kg/m.    NAD GENERAL:alert, in no acute distress and comfortable SKIN: no acute rashes, no significant lesions EYES: conjunctiva are pink and non-injected, sclera anicteric OROPHARYNX: MMM, no exudates, no oropharyngeal erythema or ulceration NECK: supple, no JVD LYMPH:  no palpable lymphadenopathy in the cervical regions LUNGS: clear to auscultation b/l with normal respiratory effort HEART: regular rate & rhythm Extremity: no pedal edema PSYCH: alert & oriented x 3 with fluent speech NEURO: no focal motor/sensory deficits  I have reviewed the data as listed  .Marland Kitchen    Latest Ref Rng & Units 09/11/2022   12:17 PM 08/28/2022    1:28 PM 08/13/2022    1:23 PM  CBC  WBC 4.0 - 10.5 K/uL 3.3  4.3  3.8   Hemoglobin 12.0 - 15.0 g/dL 11.4  12.2  12.1   Hematocrit 36.0 - 46.0 % 32.8  35.9  36.0   Platelets 150 - 400 K/uL  107  150  112     . CBC    Component Value Date/Time   WBC 3.3 (L) 09/11/2022 1217   WBC 2.2 (L) 12/11/2021 2254   RBC 3.32 (L) 09/11/2022 1217   HGB 11.4 (L) 09/11/2022 1217   HCT 32.8 (L) 09/11/2022 1217   PLT 107 (L) 09/11/2022 1217   MCV 98.8 09/11/2022 1217   MCH 34.3 (H) 09/11/2022 1217   MCHC 34.8 09/11/2022 1217   RDW 15.8 (H) 09/11/2022 1217   LYMPHSABS 0.8 09/11/2022 1217   MONOABS 0.7 09/11/2022 1217   EOSABS 0.2 09/11/2022 1217   BASOSABS 0.0 09/11/2022 1217  .    Latest Ref Rng & Units 08/28/2022    1:28 PM 08/13/2022    1:23 PM 07/30/2022    1:40 PM  CMP  Glucose 70 - 99 mg/dL 227  250  205   BUN 8 - 23 mg/dL '13  9  15   ' Creatinine 0.44 - 1.00 mg/dL 1.10  1.01  0.91   Sodium 135 - 145 mmol/L 140  137  138   Potassium 3.5 - 5.1 mmol/L 3.4  3.1  3.9   Chloride 98 - 111 mmol/L 115  109  110   CO2 22 - 32 mmol/L '18  20  20   ' Calcium 8.9 - 10.3 mg/dL 9.4  8.9  9.2   Total Protein 6.5 - 8.1 g/dL 6.4  6.3  6.7   Total Bilirubin 0.3 - 1.2 mg/dL 0.5  0.7  0.6   Alkaline Phos 38 - 126 U/L 85  89  88   AST 15 - 41 U/L '16  11  21   ' ALT 0 - 44 U/L '13  11  12      ' 09/18/2019 BM Bx Report ([id number])   09/18/2019 FISH Panel    05/30/2019 BM Bx   01/06/2019 BM Bx:     01/06/19 Cytogenetics:      05/30/19 BM Biopsy:   09/18/2019 FISH Panel    09/18/2019 BM Surgical Pathology ([id number])     RADIOGRAPHIC STUDIES: I have personally reviewed the radiological images as listed and agreed with the findings in the report. No results found.   ASSESSMENT & PLAN:   81 y.o. female with  1.  Nonsecretory multiple Myeloma, RISS Stage III -Labs upon initial presentation from 12/08/18, blood counts are normal including WBC at 7.1k, HGB at 13.1, and PLT at 245k. Calcium normal at 10.3. Creatinine normal at 0.63. M spike at 0.5g. -12/13/18 Bone Scan revealed Multifocal uptake throughout the skeleton, consistent with diffuse metastatic disease.  Primary tumor is not specified. 2. Uptake in the proximal right femur, consistent with lytic lesions. 3. Uptake in the ribs bilaterally as described. 4. Lesions in the proximal left humerus. 5. Diffuse uptake throughout the skull consistent with metastatic disease. 6. Right paramedian uptake at the manubrium. -12/13/18 CT Right Femur revealed Numerous lytic lesions involving the right femur and a lytic lesion in the left inferior pubic ramus. Overall appearance is most concerning for multiple myeloma -12/27/18 Pretreatment 24hour UPEP observed an M spike at 4m, and showed 1972mtotal protein/day.  -12/27/18 Pretreatment MMP revealed M Protein at 0.5g with IgG Lambda specificity. Kappa:Lambda light chain ratio at 0.13, with Lambda at 40.3.There is less abnormal protein and light chains than I would expect from 30% plasma cells, which suggests hypo-secretory or  non-secretory neoplastic plasma cells. Will have an impact in assessing response. -01/05/19 PET/CT revealed Innumerable lytic lesions in the skeleton compatible with myeloma. Most of the larger lesions are hypermetabolic, for example including a left proximal humeral shaft lesion with maximum SUV of 8.1 and a 2.8 cm lesion in the left T9 vertebral body with maximum SUV 5.1. Most of the smaller lytic lesions, and some of the larger lesions, do not demonstrate accentuated metabolic activity. 2. 1.2 cm in short axis lymph node in the left parapharyngeal space is hypermetabolic with maximum SUV 11.8. I do not see a separate mass in the head and neck to give rise to this hypermetabolic lymph node. 3. Mosaic attenuation in the lower lobes, nonspecific possibly from air trapping. 4.  Aortic Atherosclerosis 5. Heterogeneous activity in the liver, making it hard to exclude small liver lesions. Consider hepatic protocol MRI with and without contrast for definitive assessment. Nonobstructive right nephrolithiasis. Old granulomatous disease -01/06/19 Bone Marrow biopsy  revealed interstitial increase in plasma cells (28% aspirate, 40% CD138 immunohistochemistry). Plasma cells negative for light chains consistent with a non or weakly secretory myeloma  -01/06/19 Cytogenetics revealed 37% of cells with trisomy 11 or 11q deletion, and 40.5% of cells with 17p mutation - Received  5 cycles of KRD treatment -05/31/19 BM Biopsy revealed mild atypical plasmacytosis at 5% with polytypic variation.  -06/01/19 PET/CT revealed "Dominant lesion in the LEFT humerus is decreased significantly in metabolic activity. Additional hypermetabolic skeletal lytic lesions have decreased in metabolic activity or similar to comparison exam (01/05/2019). No evidence of disease progression. 2. Multiple additional lytic lesions do not have metabolic activity and unchanged. 3. No new skeletal lesions are identified. No soft tissue plasmacytoma identified. 4. Nodule / node in the LEFT parapharyngeal space which is intensely hypermetabolic not changed from prior. 5. New hypermetabolic LEFT lower lobe pulmonary nodule is indeterminate. Recommend close attention on follow-up 6. New obstructive hydronephrosis of the RIGHT kidney related to RIGHT UPJ stone." -09/18/2019 BM Bx Report which revealed "Slightly hypercellular bone marrow for age with trilineage hematopoiesis and 1% plasma cells." -09/14/2019 PET/CT Whole Body Scan ([id number]) which revealed "1. There widespread tiny lytic lesions compatible with multiple myeloma. Index larger lesions are generally similar to the prior exam, with low-grade activity such as the left T9 vertebral body lesion with maximum SUV 4.5. Is mild increase in the activity associated with a mildly sclerotic left proximal humeral lesion, maximum SUV 4.8 (previously 3.5). 2. At the site of the prior left lower lobe nodule is currently more bandlike thickening, with maximum SUV only 1.9, probably benign, continued surveillance of this region suggested. 3. There several small but  hypermetabolic lymph nodes. This includes a left parapharyngeal space node measuring 1.0 cm with maximum SUV 12.3 (stable); a left level IB lymph node measuring 0.5 cm with maximum SUV 4.8 (slightly larger than prior); and a left inguinal lymph node measuring 0.7 cm in short axis with maximum SUV 6.4 (previously 0.5 cm with maximum SUV 0.6). Significance of these lymph nodes uncertain, surveillance is recommended. 4. New 5 mm left lower lobe subpleural nodule on image 32/8, not appreciably hypermetabolic, surveillance suggested. 5. Focal subcutaneous stranding along the left perineum measuring about 2.6 by 1.1 cm on image 221/4, maximum SUV 12.5. This was not present previously and is most likely inflammatory, although given the notable SUV, surveillance of this region is suggested. 6. Other imaging findings of potential clinical significance: Aortic Atherosclerosis (ICD10-I70.0). Coronary atherosclerosis. Old granulomatous disease. Mild right hydronephrosis  due to a 7 mm right UPJ calculus. 2 mm right kidney upper pole nonobstructive renal calculus. Prominent stool throughout the colon favors constipation." -12/19/2019 Thoracic & Lumbar Spine MRI ([id number]) ([id number]) revealed "Suspected myeloma lesions at T9 and S1. No compression deformity or epidural disease. -01/18/2020 PET/CT ([id number]) which revealed "1. Stable lytic lesions throughout the skeleton. The larger lytic lesions which had mild metabolic activity on comparison exam now have background metabolic activity. No evidence of active myeloma. No evidence of progression multiple myeloma.  No plasmacytoma 3. Hypermetabolic nodules in the LEFT neck may be associated deep tissues of the LEFT parotid gland. Consider primary parotid neoplasm as etiology for these intensity metabolic small lesions lesions." -08/27/2020: PET/CT scan showed . No evidence of active multiple myeloma on FDG PET scan.Stable lytic lesions in the spine most consistent with  dormant/treated multiple myeloma. Multiple callused rib fractures with metabolic activity are favored chronic posttraumatic fractures.Hypermetabolic nodule in the deep LEFT parotid glands favored primary parotid neoplasm. Calcified mediastinal nodes, pulmonary nodule, and splenic granulomata. Prior granulomatous disease. -04/24/2021: PET/CT scan showed interval progression of hypermetabolism associated with the sclerotic lesion in the proximal left humerus. Site of active myeloma not excluded. Otherwise stable exam.  -10/17/2021: PET/CT scan showed Intense radiotracer activity associated with two LEFT neck lymph nodes similar to comparison exam.Interval decrease in activity in LEFT humerus lesion. Stable decreased activity in several RIGHT rib lesions. Potential posttraumatic.No new lesions identified by FDG PET imaging within the skeleton. No plasmacytoma. -06/10/2022: PET/CT scan show worsening/increasing metabolic activity in LEFT neck lymph nodes since previous imaging.Slight interval decrease in maximum SUV of the LEFT humeral lesion with slight increase in activity of posterior RIGHT rib lesion/fracture with other areas of increased metabolic activity associated with fracture and lesions with similar appearance. Unchanged appearance of multifocal lytic changes.Slight increased metabolic activity about degenerative changes in the spine, attention on follow-up given that 1 of these areas is immediately adjacent to the dominant lytic process. This dominant lytic process at T9 does not display substantial increased metabolic activity and otherwise is unchanged.  2. Heterogeneous liver activity -Seen on 01/05/19 PET/CT that showed extra-medullary hematopoiesis vs metabolic liver disease vs hepatic malignancy. -01/17/19 MRI Liver revealed several appreciable liver lesions all have benign imaging characteristics. No MRI findings of metastatic involvement of the liver. Diffuse hepatic steatosis.   3. Left  lower lobe pulmonary nodule -First seen on 06/01/19 PET/CT -PET/CT from 08/27/2020 showed no hypermetabolic mediastinal or hilar nodes. No suspicious pulmonary nodules on the CT scan.  4. 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  -Being managed conservatively as per patient's preference.  No symptoms from this currently.   PLAN: -Labs done today were discussed in detail with the patient. WBC 3.3, Hgb 11.4, Plt 107. Creatinine 0.93, Calcium 7.9.  -No prohibitive toxicities and no clinical symptoms of progression.  -Continue reduced dose Revlimid at 5 mg 3 weeks on 1 week off -Continue carfilzomib 36 mg/m every 2 weeks for maintenance -Continue Eliquis 2.5 mg p.o. twice daily for VTE prophylaxis -Continue Zometa every 4 weeks   FOLLOW UP: RTC to see Dr. Wyman Meschke Limbo on 10/09/2022 with scheduled treatment and labs.    All of the patient's questions were answered with apparent satisfaction. The patient knows to call the clinic with any problems, questions or concerns.  I have spent a total of 30 minutes minutes of face-to-face and non-face-to-face time, preparing to see the [place] a  medically appropriate examination, counseling and educating the patient, documenting clinical information in the electronic health record,  and care coordination.   Dede Query PA-C Dept of Hematology and [place] at [facility] Phone: [phone]

## 2022-09-11 NOTE — Progress Notes (Signed)
Per Dr. Irene Limbo- ok to give zometa today with corrected CA level of 8.22- patient PO calcium was discussed and suggests written on paperwork for patient to take home.

## 2022-09-15 ENCOUNTER — Encounter: Payer: Self-pay | Admitting: Hematology

## 2022-09-16 ENCOUNTER — Other Ambulatory Visit: Payer: Self-pay

## 2022-09-17 ENCOUNTER — Other Ambulatory Visit: Payer: Self-pay

## 2022-09-25 ENCOUNTER — Inpatient Hospital Stay: Payer: Medicare PPO | Attending: Hematology

## 2022-09-25 ENCOUNTER — Other Ambulatory Visit: Payer: Medicare PPO

## 2022-09-25 ENCOUNTER — Other Ambulatory Visit: Payer: Self-pay

## 2022-09-25 ENCOUNTER — Other Ambulatory Visit: Payer: Self-pay | Admitting: Hematology

## 2022-09-25 ENCOUNTER — Ambulatory Visit: Payer: Medicare PPO

## 2022-09-25 ENCOUNTER — Inpatient Hospital Stay: Payer: Medicare PPO

## 2022-09-25 VITALS — BP 117/59 | HR 72 | Temp 98.7°F | Resp 16 | Ht 59.0 in | Wt 152.0 lb

## 2022-09-25 DIAGNOSIS — Z7189 Other specified counseling: Secondary | ICD-10-CM

## 2022-09-25 DIAGNOSIS — C9000 Multiple myeloma not having achieved remission: Secondary | ICD-10-CM

## 2022-09-25 DIAGNOSIS — Z79899 Other long term (current) drug therapy: Secondary | ICD-10-CM | POA: Insufficient documentation

## 2022-09-25 DIAGNOSIS — Z5112 Encounter for antineoplastic immunotherapy: Secondary | ICD-10-CM | POA: Diagnosis not present

## 2022-09-25 DIAGNOSIS — Z95828 Presence of other vascular implants and grafts: Secondary | ICD-10-CM

## 2022-09-25 LAB — CBC WITH DIFFERENTIAL (CANCER CENTER ONLY)
Abs Immature Granulocytes: 0.02 10*3/uL (ref 0.00–0.07)
Basophils Absolute: 0 10*3/uL (ref 0.0–0.1)
Basophils Relative: 1 %
Eosinophils Absolute: 0.1 10*3/uL (ref 0.0–0.5)
Eosinophils Relative: 3 %
HCT: 35.5 % — ABNORMAL LOW (ref 36.0–46.0)
Hemoglobin: 11.9 g/dL — ABNORMAL LOW (ref 12.0–15.0)
Immature Granulocytes: 1 %
Lymphocytes Relative: 26 %
Lymphs Abs: 0.9 10*3/uL (ref 0.7–4.0)
MCH: 34.1 pg — ABNORMAL HIGH (ref 26.0–34.0)
MCHC: 33.5 g/dL (ref 30.0–36.0)
MCV: 101.7 fL — ABNORMAL HIGH (ref 80.0–100.0)
Monocytes Absolute: 0.5 10*3/uL (ref 0.1–1.0)
Monocytes Relative: 14 %
Neutro Abs: 1.8 10*3/uL (ref 1.7–7.7)
Neutrophils Relative %: 55 %
Platelet Count: 142 10*3/uL — ABNORMAL LOW (ref 150–400)
RBC: 3.49 MIL/uL — ABNORMAL LOW (ref 3.87–5.11)
RDW: 15.2 % (ref 11.5–15.5)
WBC Count: 3.3 10*3/uL — ABNORMAL LOW (ref 4.0–10.5)
nRBC: 0 % (ref 0.0–0.2)

## 2022-09-25 LAB — CMP (CANCER CENTER ONLY)
ALT: 10 U/L (ref 0–44)
AST: 12 U/L — ABNORMAL LOW (ref 15–41)
Albumin: 3.8 g/dL (ref 3.5–5.0)
Alkaline Phosphatase: 106 U/L (ref 38–126)
Anion gap: 8 (ref 5–15)
BUN: 9 mg/dL (ref 8–23)
CO2: 18 mmol/L — ABNORMAL LOW (ref 22–32)
Calcium: 8.6 mg/dL — ABNORMAL LOW (ref 8.9–10.3)
Chloride: 113 mmol/L — ABNORMAL HIGH (ref 98–111)
Creatinine: 1.04 mg/dL — ABNORMAL HIGH (ref 0.44–1.00)
GFR, Estimated: 55 mL/min — ABNORMAL LOW
Glucose, Bld: 156 mg/dL — ABNORMAL HIGH (ref 70–99)
Potassium: 3.3 mmol/L — ABNORMAL LOW (ref 3.5–5.1)
Sodium: 139 mmol/L (ref 135–145)
Total Bilirubin: 0.6 mg/dL (ref 0.3–1.2)
Total Protein: 6.2 g/dL — ABNORMAL LOW (ref 6.5–8.1)

## 2022-09-25 MED ORDER — SODIUM CHLORIDE 0.9 % IV SOLN: Freq: Once | INTRAVENOUS | Status: AC

## 2022-09-25 MED ORDER — SODIUM CHLORIDE 0.9% FLUSH
10.0000 mL | INTRAVENOUS | Status: DC | PRN
  Administered 2022-09-25: 10 mL

## 2022-09-25 MED ORDER — PROCHLORPERAZINE MALEATE 10 MG PO TABS
10.0000 mg | ORAL_TABLET | Freq: Once | ORAL | Status: AC
  Administered 2022-09-25: 10 mg via ORAL
  Filled 2022-09-25: qty 1

## 2022-09-25 MED ORDER — ACETAMINOPHEN 325 MG PO TABS
650.0000 mg | ORAL_TABLET | Freq: Once | ORAL | Status: AC
  Administered 2022-09-25: 650 mg via ORAL
  Filled 2022-09-25: qty 2

## 2022-09-25 MED ORDER — DEXAMETHASONE 4 MG PO TABS
12.0000 mg | ORAL_TABLET | Freq: Once | ORAL | Status: AC
  Administered 2022-09-25: 12 mg via ORAL
  Filled 2022-09-25: qty 3

## 2022-09-25 MED ORDER — DIPHENHYDRAMINE HCL 25 MG PO CAPS
25.0000 mg | ORAL_CAPSULE | Freq: Once | ORAL | Status: AC
  Administered 2022-09-25: 25 mg via ORAL
  Filled 2022-09-25: qty 1

## 2022-09-25 MED ORDER — DEXTROSE 5 % IV SOLN
60.0000 mg | Freq: Once | INTRAVENOUS | Status: AC
  Administered 2022-09-25: 60 mg via INTRAVENOUS
  Filled 2022-09-25: qty 30

## 2022-09-25 MED ORDER — SODIUM CHLORIDE 0.9% FLUSH
10.0000 mL | Freq: Once | INTRAVENOUS | Status: AC
  Administered 2022-09-25: 10 mL

## 2022-09-25 MED ORDER — HEPARIN SOD (PORK) LOCK FLUSH 100 UNIT/ML IV SOLN
500.0000 [IU] | Freq: Once | INTRAVENOUS | Status: AC | PRN
  Administered 2022-09-25: 500 [IU]

## 2022-09-25 MED ORDER — OXYCODONE HCL 10 MG PO TABS: 10.0000 mg | ORAL_TABLET | Freq: Four times a day (QID) | ORAL | 0 refills | Status: DC | PRN

## 2022-09-25 MED ORDER — FAMOTIDINE 20 MG PO TABS
20.0000 mg | ORAL_TABLET | Freq: Once | ORAL | Status: AC
  Administered 2022-09-25: 20 mg via ORAL
  Filled 2022-09-25: qty 1

## 2022-09-25 NOTE — Patient Instructions (Signed)
[facility]  Discharge Instructions: Thank you for choosing [facility] to provide your oncology and hematology care.   If you have a lab appointment with the [facility], please go directly to the [facility] and check in at the registration area.   Wear comfortable clothing and clothing appropriate for easy access to any Portacath or PICC line.   We strive to give you quality time with your provider. You may need to reschedule your appointment if you arrive late (15 or more minutes).  Arriving late affects you and other patients whose appointments are after yours.  Also, if you miss three or more appointments without notifying the office, you may be dismissed from the clinic at the provider's discretion.      For prescription refill requests, have your pharmacy contact our office and allow 72 hours for refills to be completed.    Today you received the following chemotherapy and/or immunotherapy agent: Carfilzomib (Kyprolis)   To help prevent nausea and vomiting after your treatment, we encourage you to take your nausea medication as directed.  BELOW ARE SYMPTOMS THAT SHOULD BE REPORTED IMMEDIATELY: *FEVER GREATER THAN 100.4 F (38 C) OR HIGHER *CHILLS OR SWEATING *NAUSEA AND VOMITING THAT IS NOT CONTROLLED WITH YOUR NAUSEA MEDICATION *UNUSUAL SHORTNESS OF BREATH *UNUSUAL BRUISING OR BLEEDING *URINARY PROBLEMS (pain or burning when urinating, or frequent urination) *BOWEL PROBLEMS (unusual diarrhea, constipation, pain near the anus) TENDERNESS IN MOUTH AND THROAT WITH OR WITHOUT PRESENCE OF ULCERS (sore throat, sores in mouth, or a toothache) UNUSUAL RASH, SWELLING OR PAIN  UNUSUAL VAGINAL DISCHARGE OR ITCHING   Items with * indicate a potential emergency and should be followed up as soon as possible or go to the Emergency Department if any problems should occur.  Please show the CHEMOTHERAPY ALERT CARD or IMMUNOTHERAPY ALERT CARD at  check-in to the Emergency Department and triage nurse.  Should you have questions after your visit or need to cancel or reschedule your appointment, please contact [facility]  Dept: [phone]  and follow the prompts.  Office hours are 8:00 a.m. to 4:30 p.m. Monday - Friday. Please note that voicemails left after 4:00 p.m. may not be returned until the following business day.  We are closed weekends and major holidays. You have access to a nurse at all times for urgent questions. Please call the main number to the clinic Dept: [phone] and follow the prompts.   For any non-urgent questions, you may also contact your provider using MyChart. We now offer e-Visits for anyone 79 and older to request care online for non-urgent symptoms. For details visit [email or web address].   Also download the MyChart app! Go to the app store, search "MyChart", open the app, select [facility], and log in with your MyChart username and password.  Masks are optional in the cancer centers. If you would like for your care team to wear a mask while they are taking care of you, please let them know. You may have one support person who is at least 79 years old accompany you for your appointments. Carfilzomib Injection What is this medication? CARFILZOMIB (kar FILZ oh mib) treats multiple myeloma, a type of bone marrow cancer. It works by blocking a protein that causes cancer cells to grow and multiply. This helps to slow or stop the spread of cancer cells. This medicine may be used for other purposes; ask your health care provider or pharmacist if you have questions.  COMMON BRAND NAME(S): KYPROLIS What should I tell my care team before I take this medication? They need to know if you have any of these conditions: Heart disease History of blood clots Irregular heartbeat Kidney disease Liver disease Lung or breathing disease An unusual or allergic reaction to carfilzomib, or other  medications, foods, dyes, or preservatives If you or your partner are pregnant or trying to get pregnant Breastfeeding How should I use this medication? This medication is injected into a vein. It is given by your care team in a hospital or clinic setting. Talk to your care team about the use of this medication in children. Special care may be needed. Overdosage: If you think you have taken too much of this medicine contact a poison control center or emergency room at once. NOTE: This medicine is only for you. Do not share this medicine with others. What if I miss a dose? Keep appointments for follow-up doses. It is important not to miss your dose. Call your care team if you are unable to keep an appointment. What may interact with this medication? Interactions are not expected. This list may not describe all possible interactions. Give your health care provider a list of all the medicines, herbs, non-prescription drugs, or dietary supplements you use. Also tell them if you smoke, drink alcohol, or use illegal drugs. Some items may interact with your medicine. What should I watch for while using this medication? Your condition will be monitored carefully while you are receiving this medication. You may need blood work while taking this medication. Check with your care team if you have severe diarrhea, nausea, and vomiting, or if you sweat a lot. The loss of too much body fluid may make it dangerous for you to take this medication. This medication may affect your coordination, reaction time, or judgment. Do not drive or operate machinery until you know how this medication affects you. Sit up or stand slowly to reduce the risk of dizzy or fainting spells. Drinking alcohol with this medication can increase the risk of these side effects. Talk to your care team if you may be pregnant. Serious birth defects can occur if you take this medication during pregnancy and for 6 months after the last dose. You  will need a negative pregnancy test before starting this medication. Contraception is recommended while taking this medication and for 6 months after the last dose. Your care team can help you find an option that works for you. If your partner can get pregnant, use a condom during sex while taking this medication and for 3 months after the last dose. Do not breastfeed while taking this medication and for 2 weeks after the last dose. This medication may cause infertility. Talk to your care team if you are concerned about your fertility. What side effects may I notice from receiving this medication? Side effects that you should report to your care team as soon as possible: Allergic reactions--skin rash, itching, hives, swelling of the face, lips, tongue, or throat Bleeding--bloody or black, tar-like stools, vomiting blood or brown material that looks like coffee grounds, red or dark brown urine, small red or purple spots on skin, unusual bruising or bleeding Blood clot--pain, swelling, or warmth in the leg, shortness of breath, chest pain Dizziness, loss of balance or coordination, confusion or trouble speaking Heart attack--pain or tightness in the chest, shoulders, arms, or jaw, nausea, shortness of breath, cold or clammy skin, feeling faint or lightheaded Heart failure--shortness of breath, swelling of  the ankles, feet, or hands, sudden weight gain, unusual weakness or fatigue Heart rhythm changes--fast or irregular heartbeat, dizziness, feeling faint or lightheaded, chest pain, trouble breathing Increase in blood pressure Infection--fever, chills, cough, sore throat, wounds that don't heal, pain or trouble when passing urine, general feeling of discomfort or being unwell Infusion reactions--chest pain, shortness of breath or trouble breathing, feeling faint or lightheaded Kidney injury--decrease in the amount of urine, swelling of the ankles, hands, or feet Liver injury--right upper belly pain,  loss of appetite, nausea, light-colored stool, dark yellow or brown urine, yellowing skin or eyes, unusual weakness or fatigue Lung injury--shortness of breath or trouble breathing, cough, spitting up blood, chest pain, fever Pulmonary hypertension--shortness of breath, chest pain, fast or irregular heartbeat, feeling faint or lightheaded, fatigue, swelling of the ankles or feet Stomach pain, bloody diarrhea, pale skin, unusual weakness or fatigue, decrease in the amount of urine, which may be signs of hemolytic uremic syndrome Sudden and severe headache, confusion, change in vision, seizures, which may be signs of posterior reversible encephalopathy syndrome (PRES) TTP--purple spots on the skin or inside the mouth, pale skin, yellowing skin or eyes, unusual weakness or fatigue, fever, fast or irregular heartbeat, confusion, change in vision, trouble speaking, trouble walking Tumor lysis syndrome (TLS)--nausea, vomiting, diarrhea, decrease in the amount of urine, dark urine, unusual weakness or fatigue, confusion, muscle pain or cramps, fast or irregular heartbeat, joint pain Side effects that usually do not require medical attention (report to your care team if they continue or are bothersome): Diarrhea Fatigue Nausea Trouble sleeping This list may not describe all possible side effects. Call your doctor for medical advice about side effects. You may report side effects to FDA at 1-800-FDA-1088. Where should I keep my medication? This medication is given in a hospital or clinic. It will not be stored at home. NOTE: This sheet is a summary. It may not cover all possible information. If you have questions about this medicine, talk to your doctor, pharmacist, or health care provider.  2023 Elsevier/Gold Standard (2022-04-29 00:00:00)  

## 2022-10-06 ENCOUNTER — Other Ambulatory Visit: Payer: Self-pay | Admitting: Hematology

## 2022-10-06 DIAGNOSIS — C9000 Multiple myeloma not having achieved remission: Secondary | ICD-10-CM

## 2022-10-07 ENCOUNTER — Other Ambulatory Visit: Payer: Self-pay | Admitting: Hematology

## 2022-10-07 DIAGNOSIS — E876 Hypokalemia: Secondary | ICD-10-CM

## 2022-10-09 ENCOUNTER — Inpatient Hospital Stay: Payer: Medicare PPO

## 2022-10-09 ENCOUNTER — Other Ambulatory Visit: Payer: Self-pay

## 2022-10-09 ENCOUNTER — Inpatient Hospital Stay: Payer: Medicare PPO | Admitting: Hematology

## 2022-10-09 ENCOUNTER — Other Ambulatory Visit: Payer: Medicare PPO

## 2022-10-09 ENCOUNTER — Ambulatory Visit: Payer: Medicare PPO

## 2022-10-09 VITALS — BP 128/62 | HR 81 | Temp 98.0°F | Resp 16 | Ht 59.0 in | Wt 151.3 lb

## 2022-10-09 DIAGNOSIS — Z7189 Other specified counseling: Secondary | ICD-10-CM

## 2022-10-09 DIAGNOSIS — C9000 Multiple myeloma not having achieved remission: Secondary | ICD-10-CM | POA: Diagnosis not present

## 2022-10-09 DIAGNOSIS — Z5111 Encounter for antineoplastic chemotherapy: Secondary | ICD-10-CM | POA: Diagnosis not present

## 2022-10-09 DIAGNOSIS — Z5112 Encounter for antineoplastic immunotherapy: Secondary | ICD-10-CM | POA: Diagnosis not present

## 2022-10-09 DIAGNOSIS — Z79899 Other long term (current) drug therapy: Secondary | ICD-10-CM | POA: Diagnosis not present

## 2022-10-09 DIAGNOSIS — Z95828 Presence of other vascular implants and grafts: Secondary | ICD-10-CM

## 2022-10-09 LAB — CMP (CANCER CENTER ONLY)
ALT: 9 U/L (ref 0–44)
AST: 9 U/L — ABNORMAL LOW (ref 15–41)
Albumin: 3.6 g/dL (ref 3.5–5.0)
Alkaline Phosphatase: 96 U/L (ref 38–126)
Anion gap: 8 (ref 5–15)
BUN: 11 mg/dL (ref 8–23)
CO2: 20 mmol/L — ABNORMAL LOW (ref 22–32)
Calcium: 8.5 mg/dL — ABNORMAL LOW (ref 8.9–10.3)
Chloride: 112 mmol/L — ABNORMAL HIGH (ref 98–111)
Creatinine: 1.04 mg/dL — ABNORMAL HIGH (ref 0.44–1.00)
GFR, Estimated: 55 mL/min — ABNORMAL LOW (ref >60)
Glucose, Bld: 194 mg/dL — ABNORMAL HIGH (ref 70–99)
Potassium: 3 mmol/L — ABNORMAL LOW (ref 3.5–5.1)
Sodium: 140 mmol/L (ref 135–145)
Total Bilirubin: 0.9 mg/dL (ref 0.3–1.2)
Total Protein: 6.1 g/dL — ABNORMAL LOW (ref 6.5–8.1)

## 2022-10-09 LAB — CBC WITH DIFFERENTIAL (CANCER CENTER ONLY)
Abs Immature Granulocytes: 0 10*3/uL (ref 0.00–0.07)
Basophils Absolute: 0 10*3/uL (ref 0.0–0.1)
Basophils Relative: 1 %
Eosinophils Absolute: 0.1 10*3/uL (ref 0.0–0.5)
Eosinophils Relative: 4 %
HCT: 34.3 % — ABNORMAL LOW (ref 36.0–46.0)
Hemoglobin: 11.5 g/dL — ABNORMAL LOW (ref 12.0–15.0)
Immature Granulocytes: 0 %
Lymphocytes Relative: 25 %
Lymphs Abs: 0.8 10*3/uL (ref 0.7–4.0)
MCH: 34 pg (ref 26.0–34.0)
MCHC: 33.5 g/dL (ref 30.0–36.0)
MCV: 101.5 fL — ABNORMAL HIGH (ref 80.0–100.0)
Monocytes Absolute: 0.8 10*3/uL (ref 0.1–1.0)
Monocytes Relative: 23 %
Neutro Abs: 1.6 10*3/uL — ABNORMAL LOW (ref 1.7–7.7)
Neutrophils Relative %: 47 %
Platelet Count: 95 10*3/uL — ABNORMAL LOW (ref 150–400)
RBC: 3.38 MIL/uL — ABNORMAL LOW (ref 3.87–5.11)
RDW: 15.1 % (ref 11.5–15.5)
WBC Count: 3.3 10*3/uL — ABNORMAL LOW (ref 4.0–10.5)
nRBC: 0 % (ref 0.0–0.2)

## 2022-10-09 MED ORDER — FAMOTIDINE 20 MG PO TABS
20.0000 mg | ORAL_TABLET | Freq: Once | ORAL | Status: AC
  Administered 2022-10-09: 20 mg via ORAL
  Filled 2022-10-09: qty 1

## 2022-10-09 MED ORDER — SODIUM CHLORIDE 0.9 % IV SOLN: Freq: Once | INTRAVENOUS | Status: DC

## 2022-10-09 MED ORDER — DEXTROSE 5 % IV SOLN
36.0000 mg/m2 | Freq: Once | INTRAVENOUS | Status: AC
  Administered 2022-10-09: 60 mg via INTRAVENOUS
  Filled 2022-10-09: qty 30

## 2022-10-09 MED ORDER — DIPHENHYDRAMINE HCL 25 MG PO CAPS
25.0000 mg | ORAL_CAPSULE | Freq: Once | ORAL | Status: AC
  Administered 2022-10-09: 25 mg via ORAL
  Filled 2022-10-09: qty 1

## 2022-10-09 MED ORDER — ACETAMINOPHEN 325 MG PO TABS
650.0000 mg | ORAL_TABLET | Freq: Once | ORAL | Status: AC
  Administered 2022-10-09: 650 mg via ORAL
  Filled 2022-10-09: qty 2

## 2022-10-09 MED ORDER — PROCHLORPERAZINE MALEATE 10 MG PO TABS
10.0000 mg | ORAL_TABLET | Freq: Once | ORAL | Status: AC
  Administered 2022-10-09: 10 mg via ORAL
  Filled 2022-10-09: qty 1

## 2022-10-09 MED ORDER — HEPARIN SOD (PORK) LOCK FLUSH 100 UNIT/ML IV SOLN
500.0000 [IU] | Freq: Once | INTRAVENOUS | Status: AC | PRN
  Administered 2022-10-09: 500 [IU]

## 2022-10-09 MED ORDER — DEXAMETHASONE 4 MG PO TABS
12.0000 mg | ORAL_TABLET | Freq: Once | ORAL | Status: AC
  Administered 2022-10-09: 12 mg via ORAL
  Filled 2022-10-09: qty 3

## 2022-10-09 MED ORDER — SODIUM CHLORIDE 0.9% FLUSH
10.0000 mL | INTRAVENOUS | Status: DC | PRN
  Administered 2022-10-09: 10 mL

## 2022-10-09 MED ORDER — ZOLEDRONIC ACID 4 MG/100ML IV SOLN
4.0000 mg | Freq: Once | INTRAVENOUS | Status: AC
  Administered 2022-10-09: 4 mg via INTRAVENOUS
  Filled 2022-10-09: qty 100

## 2022-10-09 MED ORDER — SODIUM CHLORIDE 0.9 % IV SOLN: Freq: Once | INTRAVENOUS | Status: AC

## 2022-10-09 NOTE — Patient Instructions (Signed)
[facility]  Discharge Instructions: Thank you for choosing [facility] to provide your oncology and hematology care.   If you have a lab appointment with the Cancer Center, please go directly to the Cancer Center and check in at the registration area.   Wear comfortable clothing and clothing appropriate for easy access to any Portacath or PICC line.   We strive to give you quality time with your provider. You may need to reschedule your appointment if you arrive late (15 or more minutes).  Arriving late affects you and other patients whose appointments are after yours.  Also, if you miss three or more appointments without notifying the office, you may be dismissed from the clinic at the provider's discretion.      For prescription refill requests, have your pharmacy contact our office and allow 72 hours for refills to be completed.    Today you received the following chemotherapy and/or immunotherapy agents: Kyprolis.       To help prevent nausea and vomiting after your treatment, we encourage you to take your nausea medication as directed.  BELOW ARE SYMPTOMS THAT SHOULD BE REPORTED IMMEDIATELY: *FEVER GREATER THAN 100.4 F (38 C) OR HIGHER *CHILLS OR SWEATING *NAUSEA AND VOMITING THAT IS NOT CONTROLLED WITH YOUR NAUSEA MEDICATION *UNUSUAL SHORTNESS OF BREATH *UNUSUAL BRUISING OR BLEEDING *URINARY PROBLEMS (pain or burning when urinating, or frequent urination) *BOWEL PROBLEMS (unusual diarrhea, constipation, pain near the anus) TENDERNESS IN MOUTH AND THROAT WITH OR WITHOUT PRESENCE OF ULCERS (sore throat, sores in mouth, or a toothache) UNUSUAL RASH, SWELLING OR PAIN  UNUSUAL VAGINAL DISCHARGE OR ITCHING   Items with * indicate a potential emergency and should be followed up as soon as possible or go to the Emergency Department if any problems should occur.  Please show the CHEMOTHERAPY ALERT CARD or IMMUNOTHERAPY ALERT CARD at check-in to  the Emergency Department and triage nurse.  Should you have questions after your visit or need to cancel or reschedule your appointment, please contact [facility]  Dept: [phone]  and follow the prompts.  Office hours are 8:00 a.m. to 4:30 p.m. Monday - Friday. Please note that voicemails left after 4:00 p.m. may not be returned until the following business day.  We are closed weekends and major holidays. You have access to a nurse at all times for urgent questions. Please call the main number to the clinic Dept: [phone] and follow the prompts.   For any non-urgent questions, you may also contact your provider using MyChart. We now offer e-Visits for anyone 18 and older to request care online for non-urgent symptoms. For details visit [email or web address].   Also download the MyChart app! Go to the app store, search "MyChart", open the app, select [facility], and log in with your MyChart username and password.  Masks are optional in the cancer centers. If you would like for your care team to wear a mask while they are taking care of you, please let them know. You may have one support person who is at least 79 years old accompany you for your appointments. 

## 2022-10-09 NOTE — Progress Notes (Signed)
Per Dr. Irene Limbo, okay to treat with platelets 95

## 2022-10-11 ENCOUNTER — Other Ambulatory Visit: Payer: Self-pay

## 2022-10-13 ENCOUNTER — Other Ambulatory Visit: Payer: Self-pay

## 2022-10-16 ENCOUNTER — Encounter: Payer: Self-pay | Admitting: Hematology

## 2022-10-16 NOTE — Addendum Note (Signed)
Addended by: Sullivan Lone on: 10/16/2022 02:01 AM   Modules accepted: Orders

## 2022-10-16 NOTE — Progress Notes (Addendum)
HEMATOLOGY/ONCOLOGY CLINIC NOTE  Date of Service: 10/09/2022   Patient Care Team: Hoyt Koch, MD as PCP - General (Internal Medicine) Lafayette Dragon, MD (Inactive) as Consulting Physician (Gastroenterology) Megan Salon, MD as Consulting Physician (Gynecology) Melrose Nakayama, MD as Consulting Physician (Orthopedic Surgery) Deneise Lever, MD as Consulting Physician (Pulmonary Disease) [name], MD (Ophthalmology)  CHIEF COMPLAINTS/PURPOSE OF CONSULTATION:  Follow-up for continued evaluation and management of multiple myeloma  HISTORY OF PRESENTING ILLNESS:  Please see previous notes for details on initial presentation  Current Treatment: Carfilzomib + Revlimid maintenance.  INTERVAL HISTORY:   Faith Orr is a 81 y.o. female i is here for continued evaluation and management of the patient's multiple myeloma. .  No acute new symptoms since her last clinic visit.  No fevers no chills no night sweats.  No unexpected weight loss.  No focal bone pains. No notable new toxicity from her carfilzomib plus Revlimid maintenance. Grade 1 fatigue. Labs done today were discussed in detail with the patient.  MEDICAL HISTORY:  Past Medical History:  Diagnosis Date   Allergy    seasonal   Asthma    DEPRESSION    DIABETES MELLITUS, TYPE II    Diverticulosis    HYPERLIPIDEMIA    Macular degeneration of left eye    mild, Dr.Hecker   Obesity, unspecified    Osteoarthritis of both knees    OSTEOPENIA    Osteopenia    URINARY INCONTINENCE     SURGICAL HISTORY: Past Surgical History:  Procedure Laterality Date   CATARACT EXTRACTION Left 05/24/2018   CESAREAN SECTION  01/1973   CYSTOSCOPY/URETEROSCOPY/HOLMIUM LASER/STENT PLACEMENT Right 09/20/2019   Procedure: CYSTOSCOPY/URETEROSCOPY/HOLMIUM LASER/STENT PLACEMENT;  Surgeon: Lucas Mallow, MD;  Location: [facility];  Service: Urology;  Laterality: Right;   FRACTURE SURGERY     IR  IMAGING GUIDED PORT INSERTION  02/20/2019   left wrist surgery  2008   By Dr. Latanya Maudlin   right ankle  1994    SOCIAL HISTORY: Social History   Socioeconomic History   Marital status: Married    Spouse name: Not on file   Number of children: 1   Years of education: Not on file   Highest education level: Not on file  Occupational History    Employer: [employer]  Tobacco Use   Smoking status: Never   Smokeless tobacco: Never   Tobacco comments:    Lives with partner [name]) and son  Vaping Use   Vaping Use: Never used  Substance and Sexual Activity   Alcohol use: No    Alcohol/week: 0.0 standard drinks of alcohol   Drug use: No   Sexual activity: Never    Partners: Female    Birth control/protection: Post-menopausal    Comment: Lives with female partner (annette hicks) and 15 yo son  Other Topics Concern   Not on file  Social History Narrative   Not on file   Social Determinants of Health   Financial Resource Strain: Low Risk  (02/27/2022)   Overall Financial Resource Strain (CARDIA)    Difficulty of Paying Living Expenses: Not hard at all  Food Insecurity: No Food Insecurity (02/27/2022)   Hunger Vital Sign    Worried About Running Out of Food in the Last Year: Never true    [place] in the Last Year: Never true  Transportation Needs: No Transportation Needs (02/27/2022)   PRAPARE - Transportation    Lack of  Transportation (Medical): No    Lack of Transportation (Non-Medical): No  Physical Activity: Unknown (02/27/2022)   Exercise Vital Sign    Days of Exercise per Week: Patient refused    Minutes of Exercise per Session: Patient refused  Stress: No Stress Concern Present (02/27/2022)   [place]    Feeling of Stress : Not at all  Social Connections: Unknown (02/27/2022)   Social Connection and Isolation Panel [NHANES]    Frequency of Communication with Friends and Family:  Patient refused    Frequency of Social Gatherings with Friends and Family: Patient refused    Attends Religious Services: Patient refused    Active Member of Clubs or Organizations: Patient refused    Attends Archivist Meetings: Patient refused    Marital Status: Living with partner  Intimate Partner Violence: Not At Risk (05/08/2021)   Humiliation, Afraid, Rape, and Kick questionnaire    Fear of Current or Ex-Partner: No    Emotionally Abused: No    Physically Abused: No    Sexually Abused: No    FAMILY HISTORY: Family History  Problem Relation Age of Onset   Diabetes Father    Hyperlipidemia Father    Heart disease Father    Cancer Father    Hypertension Father    Colon cancer Paternal Grandmother 16   Osteoporosis Mother    Protein S deficiency Mother    Hyperlipidemia Mother    Multiple sclerosis Daughter    Cancer Other        bladder   Breast cancer Neg Hx     ALLERGIES:  is allergic to levofloxacin, penicillins, aleve [naproxen sodium], and sulfonamide derivatives.  MEDICATIONS:  Current Outpatient Medications  Medication Sig Dispense Refill   acyclovir (ZOVIRAX) 400 MG tablet Take 1 tablet (400 mg total) by mouth 2 (two) times daily. 60 tablet 5   Blood Glucose Monitoring Suppl (FREESTYLE FREEDOM LITE) W/DEVICE KIT Use to check blood sugars twice a day Dx 250.00 1 each 0   Calcium Carbonate-Vitamin D 600-400 MG-UNIT tablet Take 1 tablet by mouth 2 (two) times daily.     dexamethasone (DECADRON) 4 MG tablet Take 5 tablets (20 mg total) by mouth once a week. On D22 of each cycle of treatment 20 tablet 5   ELIQUIS 2.5 MG TABS tablet TAKE 1 TABLET(2.5 MG) BY MOUTH TWICE DAILY 60 tablet 2   fentaNYL (DURAGESIC) 12 MCG/HR Place 1 patch onto the skin every 3 (three) days. 10 patch 0   fluticasone (FLONASE) 50 MCG/ACT nasal spray Place 1 spray into both nostrils daily. 16 g 2   glucose blood (FREESTYLE LITE) test strip CHECK BLOOD SUGAR TWICE DAILY AS  DIRECTED Dx 250.00 180 each 3   Lancets (FREESTYLE) lancets Use twice daily to check sugars. 100 each 11   lenalidomide (REVLIMID) 5 MG capsule TAKE 1 CAPSULE BY MOUTH DAILY  FOR 21 DAYS, THEN 7 DAYS OFF 21 capsule 0   lidocaine-prilocaine (EMLA) cream APPLY 1 APPLICATION TO THE AFFECTED AREA AS NEEDED. USE PRIOR TO PORT ACCESS 30 g 0   meclizine (ANTIVERT) 25 MG tablet Take 1 tablet (25 mg total) by mouth every 8 (eight) hours as needed for dizziness. 20 tablet 0   metFORMIN (GLUCOPHAGE-XR) 500 MG 24 hr tablet Take 3 tablets (1,500 mg total) by mouth daily with breakfast. TAKE 3 TABLETS(1500 MG) BY MOUTH DAILY WITH BREAKFAST Strength: 500 mg 180 tablet 1   Multiple Vitamins-Minerals (ICAPS) CAPS Take  1 capsule by mouth daily after breakfast.     ondansetron (ZOFRAN) 8 MG tablet Take 1 tablet (8 mg total) by mouth 2 (two) times daily as needed (Nausea or vomiting). 30 tablet 1   Oxycodone HCl 10 MG TABS Take 1 tablet (10 mg total) by mouth every 6 (six) hours as needed. 90 tablet 0   polyethylene glycol (MIRALAX / GLYCOLAX) packet Take 17 g by mouth daily after breakfast.     potassium chloride SA (KLOR-CON M) 20 MEQ tablet TAKE 2 TABLETS BY MOUTH TWICE DAILY 360 tablet 1   prochlorperazine (COMPAZINE) 10 MG tablet Take 1 tablet (10 mg total) by mouth every 6 (six) hours as needed (Nausea or vomiting). 30 tablet 1   senna-docusate (SENNA S) 8.6-50 MG tablet Take 2 tablets by mouth at bedtime. 60 tablet 2   sertraline (ZOLOFT) 100 MG tablet TAKE 1 TABLET(100 MG) BY MOUTH DAILY 90 tablet 0   simvastatin (ZOCOR) 20 MG tablet TAKE 1 TABLET BY MOUTH EVERY DAY AT 6 PM 90 tablet 3   Vitamin D, Ergocalciferol, (DRISDOL) 1.25 MG (50000 UNIT) CAPS capsule TAKE 1 CAPSULE BY MOUTH EVERY 7 DAYS 12 capsule 0   No current facility-administered medications for this visit.   Facility-Administered Medications Ordered in Other Visits  Medication Dose Route Frequency Provider Last Rate Last Admin   heparin lock  flush 100 unit/mL  500 Units Intracatheter Once PRN Brunetta Genera, MD       sodium chloride flush (NS) 0.9 % injection 10 mL  10 mL Intracatheter PRN Brunetta Genera, MD   10 mL at 10/02/19 1524    REVIEW OF SYSTEMS:   10 Point review of Systems was done is negative except as noted above.  PHYSICAL EXAMINATION: ECOG FS:2 - Symptomatic, <50% confined to bed  Vitals:   07/17/22 1258  BP: 130/73  Pulse: 73  Temp: 98.2 F (36.8 C)  SpO2: 98%    Wt Readings from Last 3 Encounters:  07/17/22 152 lb 4.8 oz (69.1 kg)  07/02/22 149 lb 12 oz (67.9 kg)  06/18/22 148 lb 4 oz (67.2 kg)   Body mass index is 30.76 kg/m.    NAD GENERAL:alert, in no acute distress and comfortable SKIN: no acute rashes, no significant lesions EYES: conjunctiva are pink and non-injected, sclera anicteric OROPHARYNX: MMM, no exudates, no oropharyngeal erythema or ulceration NECK: supple, no JVD LYMPH:  no palpable lymphadenopathy in the cervical, axillary or inguinal regions LUNGS: clear to auscultation b/l with normal respiratory effort HEART: regular rate & rhythm ABDOMEN:  normoactive bowel sounds , non tender, not distended. Extremity: no pedal edema PSYCH: alert & oriented x 3 with fluent speech NEURO: no focal motor/sensory deficits  .Marland Kitchen    Latest Ref Rng & Units 10/09/2022   10:12 AM 09/25/2022    8:25 AM 09/11/2022   12:17 PM  CBC  WBC 4.0 - 10.5 K/uL 3.3  3.3  3.3   Hemoglobin 12.0 - 15.0 g/dL 11.5  11.9  11.4   Hematocrit 36.0 - 46.0 % 34.3  35.5  32.8   Platelets 150 - 400 K/uL 95  142  107     . CBC    Component Value Date/Time   WBC 3.3 (L) 10/09/2022 1012   WBC 2.2 (L) 12/11/2021 2254   RBC 3.38 (L) 10/09/2022 1012   HGB 11.5 (L) 10/09/2022 1012   HCT 34.3 (L) 10/09/2022 1012   PLT 95 (L) 10/09/2022 1012   MCV 101.5 (H)  10/09/2022 1012   MCH 34.0 10/09/2022 1012   MCHC 33.5 10/09/2022 1012   RDW 15.1 10/09/2022 1012   LYMPHSABS 0.8 10/09/2022 1012   MONOABS 0.8  10/09/2022 1012   EOSABS 0.1 10/09/2022 1012   BASOSABS 0.0 10/09/2022 1012  .    Latest Ref Rng & Units 10/09/2022   10:12 AM 09/25/2022    8:25 AM 09/11/2022   12:17 PM  CMP  Glucose 70 - 99 mg/dL 194  156  199   BUN 8 - 23 mg/dL _0 Creatinine 0.44 - 1.00 mg/dL 1.04  1.04  0.93   Sodium 135 - 145 mmol/L 140  139  138   Potassium 3.5 - 5.1 mmol/L 3.0  3.3  3.3   Chloride 98 - 111 mmol/L 112  113  112   CO2 22 - 32 mmol/L _1 Calcium 8.9 - 10.3 mg/dL 8.5  8.6  7.9   Total Protein 6.5 - 8.1 g/dL 6.1  6.2  5.9   Total Bilirubin 0.3 - 1.2 mg/dL 0.9  0.6  0.8   Alkaline Phos 38 - 126 U/L 96  106  93   AST 15 - 41 U/L _2 ALT 0 - 44 U/L _3 09/18/2019 BM Bx Report ([id number])   09/18/2019 FISH Panel    05/30/2019 BM Bx   01/06/2019 BM Bx:     01/06/19 Cytogenetics:      05/30/19 BM Biopsy:   09/18/2019 FISH Panel    09/18/2019 BM Surgical Pathology ([id number])     RADIOGRAPHIC STUDIES: I have personally reviewed the radiological images as listed and agreed with the findings in the report. No results found.   ASSESSMENT & PLAN:   81 y.o. female with  1.  Nonsecretory multiple Myeloma, RISS Stage III  Labs upon initial presentation from 12/08/18, blood counts are normal including WBC at 7.1k, HGB at 13.1, and PLT at 245k. Calcium normal at 10.3. Creatinine normal at 0.63. M spike at 0.5g. 12/13/18 Bone Scan revealed Multifocal uptake throughout the skeleton, consistent with diffuse metastatic disease. Primary tumor is not specified. 2. Uptake in the proximal right femur, consistent with lytic lesions. 3. Uptake in the ribs bilaterally as described. 4. Lesions in the proximal left humerus. 5. Diffuse uptake throughout the skull consistent with metastatic disease. 6. Right paramedian uptake at the manubrium.  12/13/18 CT Right Femur revealed Numerous lytic lesions involving the right femur and a lytic lesion in the  left inferior pubic ramus. Overall appearance is most concerning for multiple myeloma  12/27/18 Pretreatment 24hour UPEP observed an M spike at 55m, and showed 1980mtotal protein/day.  12/27/18 Pretreatment MMP revealed M Protein at 0.5g with IgG Lambda specificity. Kappa:Lambda light chain ratio at 0.13, with Lambda at 40.3. There is less abnormal protein and light chains than I would expect from 30% plasma cells, which suggests hypo-secretory or non-secretory neoplastic plasma cells. Will have an impact in assessing response. 01/05/19 PET/CT revealed Innumerable lytic lesions in the skeleton compatible with myeloma. Most of the larger lesions are hypermetabolic, for example including a left proximal humeral shaft lesion with maximum SUV of 8.1 and a 2.8 cm lesion in the left T9 vertebral body with maximum SUV 5.1. Most of the smaller lytic lesions, and some of the larger lesions, do not demonstrate accentuated metabolic activity. 2. 1.2 cm in short axis  lymph node in the left parapharyngeal space is hypermetabolic with maximum SUV 11.8. I do not see a separate mass in the head and neck to give rise to this hypermetabolic lymph node. 3. Mosaic attenuation in the lower lobes, nonspecific possibly from air trapping. 4.  Aortic Atherosclerosis 5. Heterogeneous activity in the liver, making it hard to exclude small liver lesions. Consider hepatic protocol MRI with and without contrast for definitive assessment. Nonobstructive right nephrolithiasis. Old granulomatous disease  01/06/19 Bone Marrow biopsy revealed interstitial increase in plasma cells (28% aspirate, 40% CD138 immunohistochemistry). Plasma cells negative for light chains consistent with a non or weakly secretory myeloma   01/06/19 Cytogenetics revealed 37% of cells with trisomy 11 or 11q deletion, and 40.5% of cells with 17p mutation  S/p 5 cycles of KRD treatment  05/31/19 BM Biopsy revealed mild atypical plasmacytosis at 5% with polytypic  variation.   06/01/19 PET/CT revealed "Dominant lesion in the LEFT humerus is decreased significantly in metabolic activity. Additional hypermetabolic skeletal lytic lesions have decreased in metabolic activity or similar to comparison exam (01/05/2019). No evidence of disease progression. 2. Multiple additional lytic lesions do not have metabolic activity and unchanged. 3. No new skeletal lesions are identified. No soft tissue plasmacytoma identified. 4. Nodule / node in the LEFT parapharyngeal space which is intensely hypermetabolic not changed from prior. 5. New hypermetabolic LEFT lower lobe pulmonary nodule is indeterminate. Recommend close attention on follow-up 6. New obstructive hydronephrosis of the RIGHT kidney related to RIGHT UPJ stone."  09/18/2019 BM Bx Report which revealed "Slightly hypercellular bone marrow for age with trilineage hematopoiesis and 1% plasma cells."  09/14/2019 PET/CT Whole Body Scan ([id number]) which revealed "1. There widespread tiny lytic lesions compatible with multiple myeloma. Index larger lesions are generally similar to the prior exam, with low-grade activity such as the left T9 vertebral body lesion with maximum SUV 4.5. Is mild increase in the activity associated with a mildly sclerotic left proximal humeral lesion, maximum SUV 4.8 (previously 3.5). 2. At the site of the prior left lower lobe nodule is currently more bandlike thickening, with maximum SUV only 1.9, probably benign, continued surveillance of this region suggested. 3. There several small but hypermetabolic lymph nodes. This includes a left parapharyngeal space node measuring 1.0 cm with maximum SUV 12.3 (stable); a left level IB lymph node measuring 0.5 cm with maximum SUV 4.8 (slightly larger than prior); and a left inguinal lymph node measuring 0.7 cm in short axis with maximum SUV 6.4 (previously 0.5 cm with maximum SUV 0.6). Significance of these lymph nodes uncertain, surveillance is recommended.  4. New 5 mm left lower lobe subpleural nodule on image 32/8, not appreciably hypermetabolic, surveillance suggested. 5. Focal subcutaneous stranding along the left perineum measuring about 2.6 by 1.1 cm on image 221/4, maximum SUV 12.5. This was not present previously and is most likely inflammatory, although given the notable SUV, surveillance of this region is suggested. 6. Other imaging findings of potential clinical significance: Aortic Atherosclerosis (ICD10-I70.0). Coronary atherosclerosis. Old granulomatous disease. Mild right hydronephrosis due to a 7 mm right UPJ calculus. 2 mm right kidney upper pole nonobstructive renal calculus. Prominent stool throughout the colon favors constipation."  12/19/2019 Thoracic & Lumbar Spine MRI ([id number]) ([id number]) revealed "Suspected myeloma lesions at T9 and S1. No compression deformity or epidural disease.  01/18/2020 PET/CT ([id number]) which revealed "1. Stable lytic lesions throughout the skeleton. The larger lytic lesions which had mild metabolic activity on comparison exam now have background metabolic activity. No  evidence of active myeloma. No evidence of progression multiple myeloma.  No plasmacytoma 3. Hypermetabolic nodules in the LEFT neck may be associated deep tissues of the LEFT parotid gland. 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  01/17/19 MRI Liver revealed Several appreciable liver lesions all have benign imaging characteristics. No MRI findings of metastatic involvement of the liver. 2. Scattered bony lesions corresponding to the lytic lesions seen at PET-CT, compatible with active myeloma. 3. Aortic Atherosclerosis.  Mild cardiomegaly. 4. Diffuse hepatic steatosis.   3. Left lower lobe pulmonary nodule First seen on 06/01/19 PET/CT PET/CT  12/24/7354: No hypermetabolic mediastinal or hilar nodes. No suspicious pulmonary nodules on the CT scan.  4. 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 Being managed conservatively as per patient's preference.  No symptoms from this currently.  5. Mild chronic kidney disease CMP done 04/09/2022 showed creatinine of 1.17 and GFR est of 48 consistent with mild chronic kidney disease.  PLAN: -Labs done today were discussed in detail with the patient. -CBC stable with mild anemia with a hemoglobin of 11.5, mild thrombocytopenia with platelets of 95k and WBC count of 3.3k CMP shows creatinine of 1.04 and hypokalemia with a potassium of 3. Patient was encouraged to have a potassium rich diet and continue potassium replacement. --Continue reduced dose Revlimid at 5 mg 3 weeks on 1 week off -Continue carfilzomib 36 mg/m every 2 weeks for maintenance with same supportive medications Continue Eliquis 2.5 mg p.o. twice daily for VTE prophylaxis Continue Zometa every 4 weeks Treatment orders were reviewed and placed  FOLLOW UP: Follow-up as per integrated scheduling  The total time spent in the appointment was 30 minutes*.  All of the patient's questions were answered with apparent satisfaction. The patient knows to call the clinic with any problems, questions or concerns.   Sullivan Lone MD MS AAHIVMS [facility] Hematology/Oncology Physician [facility]  .*Total Encounter Time as defined by the Centers for Medicare and Medicaid Services includes, in addition to the face-to-face time of a patient visit (documented in the note above) non-face-to-face time: obtaining and reviewing outside history, ordering and reviewing medications, tests or procedures, care coordination (communications with other health care professionals or caregivers) and documentation in the medical record.

## 2022-10-20 ENCOUNTER — Telehealth: Payer: Self-pay | Admitting: Hematology

## 2022-10-20 ENCOUNTER — Other Ambulatory Visit: Payer: Self-pay

## 2022-10-20 NOTE — Telephone Encounter (Signed)
Per 11/7 infusion overbook, pt r/s and message left

## 2022-10-21 ENCOUNTER — Other Ambulatory Visit: Payer: Self-pay

## 2022-10-21 DIAGNOSIS — C9000 Multiple myeloma not having achieved remission: Secondary | ICD-10-CM

## 2022-10-23 ENCOUNTER — Inpatient Hospital Stay: Payer: Medicare PPO | Admitting: Physician Assistant

## 2022-10-23 ENCOUNTER — Inpatient Hospital Stay: Payer: Medicare PPO | Attending: Hematology

## 2022-10-23 ENCOUNTER — Other Ambulatory Visit: Payer: Self-pay | Admitting: Hematology

## 2022-10-23 ENCOUNTER — Inpatient Hospital Stay: Payer: Medicare PPO

## 2022-10-23 DIAGNOSIS — C9000 Multiple myeloma not having achieved remission: Secondary | ICD-10-CM

## 2022-10-23 DIAGNOSIS — Z5112 Encounter for antineoplastic immunotherapy: Secondary | ICD-10-CM | POA: Diagnosis not present

## 2022-10-23 DIAGNOSIS — Z23 Encounter for immunization: Secondary | ICD-10-CM | POA: Diagnosis not present

## 2022-10-23 DIAGNOSIS — Z79899 Other long term (current) drug therapy: Secondary | ICD-10-CM | POA: Insufficient documentation

## 2022-10-23 DIAGNOSIS — Z95828 Presence of other vascular implants and grafts: Secondary | ICD-10-CM

## 2022-10-23 DIAGNOSIS — Z7189 Other specified counseling: Secondary | ICD-10-CM

## 2022-10-23 LAB — CBC WITH DIFFERENTIAL (CANCER CENTER ONLY)
Abs Immature Granulocytes: 0.01 10*3/uL (ref 0.00–0.07)
Basophils Absolute: 0 10*3/uL (ref 0.0–0.1)
Basophils Relative: 1 %
Eosinophils Absolute: 0.1 10*3/uL (ref 0.0–0.5)
Eosinophils Relative: 2 %
HCT: 34.9 % — ABNORMAL LOW (ref 36.0–46.0)
Hemoglobin: 11.8 g/dL — ABNORMAL LOW (ref 12.0–15.0)
Immature Granulocytes: 0 %
Lymphocytes Relative: 25 %
Lymphs Abs: 0.9 10*3/uL (ref 0.7–4.0)
MCH: 34.8 pg — ABNORMAL HIGH (ref 26.0–34.0)
MCHC: 33.8 g/dL (ref 30.0–36.0)
MCV: 102.9 fL — ABNORMAL HIGH (ref 80.0–100.0)
Monocytes Absolute: 0.4 10*3/uL (ref 0.1–1.0)
Monocytes Relative: 12 %
Neutro Abs: 2.2 10*3/uL (ref 1.7–7.7)
Neutrophils Relative %: 60 %
Platelet Count: 137 10*3/uL — ABNORMAL LOW (ref 150–400)
RBC: 3.39 MIL/uL — ABNORMAL LOW (ref 3.87–5.11)
RDW: 14.9 % (ref 11.5–15.5)
WBC Count: 3.6 10*3/uL — ABNORMAL LOW (ref 4.0–10.5)
nRBC: 0 % (ref 0.0–0.2)

## 2022-10-23 LAB — CMP (CANCER CENTER ONLY)
ALT: 10 U/L (ref 0–44)
AST: 11 U/L — ABNORMAL LOW (ref 15–41)
Albumin: 3.8 g/dL (ref 3.5–5.0)
Alkaline Phosphatase: 98 U/L (ref 38–126)
Anion gap: 8 (ref 5–15)
BUN: 7 mg/dL — ABNORMAL LOW (ref 8–23)
CO2: 20 mmol/L — ABNORMAL LOW (ref 22–32)
Calcium: 9.1 mg/dL (ref 8.9–10.3)
Chloride: 109 mmol/L (ref 98–111)
Creatinine: 0.97 mg/dL (ref 0.44–1.00)
GFR, Estimated: 59 mL/min — ABNORMAL LOW (ref >60)
Glucose, Bld: 176 mg/dL — ABNORMAL HIGH (ref 70–99)
Potassium: 3.4 mmol/L — ABNORMAL LOW (ref 3.5–5.1)
Sodium: 137 mmol/L (ref 135–145)
Total Bilirubin: 0.6 mg/dL (ref 0.3–1.2)
Total Protein: 6.5 g/dL (ref 6.5–8.1)

## 2022-10-23 LAB — HEMOGLOBIN A1C
Hgb A1c MFr Bld: 5.1 % (ref 4.8–5.6)
Mean Plasma Glucose: 99.67 mg/dL

## 2022-10-23 MED ORDER — SODIUM CHLORIDE 0.9 % IV SOLN: Freq: Once | INTRAVENOUS | Status: AC

## 2022-10-23 MED ORDER — DIPHENHYDRAMINE HCL 25 MG PO CAPS
25.0000 mg | ORAL_CAPSULE | Freq: Once | ORAL | Status: AC
  Administered 2022-10-23: 25 mg via ORAL
  Filled 2022-10-23: qty 1

## 2022-10-23 MED ORDER — FENTANYL 12 MCG/HR TD PT72: 1.0000 | MEDICATED_PATCH | TRANSDERMAL | 0 refills | Status: DC

## 2022-10-23 MED ORDER — DEXAMETHASONE 4 MG PO TABS
12.0000 mg | ORAL_TABLET | Freq: Once | ORAL | Status: AC
  Administered 2022-10-23: 12 mg via ORAL
  Filled 2022-10-23: qty 3

## 2022-10-23 MED ORDER — PROCHLORPERAZINE MALEATE 10 MG PO TABS
10.0000 mg | ORAL_TABLET | Freq: Once | ORAL | Status: AC
  Administered 2022-10-23: 10 mg via ORAL
  Filled 2022-10-23: qty 1

## 2022-10-23 MED ORDER — INFLUENZA VAC A&B SA ADJ QUAD 0.5 ML IM PRSY
0.5000 mL | PREFILLED_SYRINGE | Freq: Once | INTRAMUSCULAR | Status: AC
  Administered 2022-10-23: 0.5 mL via INTRAMUSCULAR
  Filled 2022-10-23: qty 0.5

## 2022-10-23 MED ORDER — FAMOTIDINE 20 MG PO TABS
20.0000 mg | ORAL_TABLET | Freq: Once | ORAL | Status: AC
  Administered 2022-10-23: 20 mg via ORAL
  Filled 2022-10-23: qty 1

## 2022-10-23 MED ORDER — HEPARIN SOD (PORK) LOCK FLUSH 100 UNIT/ML IV SOLN
500.0000 [IU] | Freq: Once | INTRAVENOUS | Status: AC | PRN
  Administered 2022-10-23: 500 [IU]

## 2022-10-23 MED ORDER — SODIUM CHLORIDE 0.9% FLUSH
10.0000 mL | Freq: Once | INTRAVENOUS | Status: AC
  Administered 2022-10-23: 10 mL

## 2022-10-23 MED ORDER — SODIUM CHLORIDE 0.9% FLUSH
10.0000 mL | INTRAVENOUS | Status: DC | PRN
  Administered 2022-10-23: 10 mL

## 2022-10-23 MED ORDER — ACETAMINOPHEN 325 MG PO TABS
650.0000 mg | ORAL_TABLET | Freq: Once | ORAL | Status: AC
  Administered 2022-10-23: 650 mg via ORAL
  Filled 2022-10-23: qty 2

## 2022-10-23 MED ORDER — DEXTROSE 5 % IV SOLN
36.0000 mg/m2 | Freq: Once | INTRAVENOUS | Status: AC
  Administered 2022-10-23: 60 mg via INTRAVENOUS
  Filled 2022-10-23: qty 30

## 2022-10-23 NOTE — Progress Notes (Signed)
HEMATOLOGY/ONCOLOGY CLINIC NOTE  Date of Service: 10/23/2022   Patient Care Team: Hoyt Koch, MD as PCP - General (Internal Medicine) Lafayette Dragon, MD (Inactive) as Consulting Physician (Gastroenterology) Megan Salon, MD as Consulting Physician (Gynecology) Melrose Nakayama, MD as Consulting Physician (Orthopedic Surgery) Deneise Lever, MD as Consulting Physician (Pulmonary Disease) [name], MD (Ophthalmology)  CHIEF COMPLAINTS/PURPOSE OF CONSULTATION:  Follow-up for continued evaluation and management of multiple myeloma  HISTORY OF PRESENTING ILLNESS:  Please see previous notes for details on initial presentation  Current Treatment: Carfilzomib + Revlimid maintenance.  INTERVAL HISTORY:   Faith Orr is a 81 y.o. female i is here for continued evaluation and management of the patient's multiple myeloma. Patient was last seen by Dr. Oluwadamilare Tobler Limbo on 10/09/2022. In the interim, she denies any changes to her health. She is unaccompanied for this visit.   [name] reports overall her energy levels are fairly stable.  She has chronic fatigue but is able to complete her ADLs on her own.She denies any appetite changes or weight loss. She has occasional episodes of nausea that resolves with prescribed antiemetics. She denies any vomiting episodes. She reports more frequent episodes of diarrhea, up to 2-3 times  a week. She suspects the diarrhea is triggered by what she eats. She adds that she takes Senna-S for constipation. She denies easy bruising or signs of bleeding. Over the last couple of weeks, she has a runny nose and productive cough in the morning. She is currently taking flonase nasal spray with some improvement.  She denies fevers, chills, sweats, shortness of breath or chest pain. She has no other complaints.  MEDICAL HISTORY:  Past Medical History:  Diagnosis Date   Allergy    seasonal   Asthma    DEPRESSION    DIABETES MELLITUS, TYPE II     Diverticulosis    HYPERLIPIDEMIA    Macular degeneration of left eye    mild, Dr.Hecker   Obesity, unspecified    Osteoarthritis of both knees    OSTEOPENIA    Osteopenia    URINARY INCONTINENCE     SURGICAL HISTORY: Past Surgical History:  Procedure Laterality Date   CATARACT EXTRACTION Left 05/24/2018   CESAREAN SECTION  01/1973   CYSTOSCOPY/URETEROSCOPY/HOLMIUM LASER/STENT PLACEMENT Right 09/20/2019   Procedure: CYSTOSCOPY/URETEROSCOPY/HOLMIUM LASER/STENT PLACEMENT;  Surgeon: Lucas Mallow, MD;  Location: [facility];  Service: Urology;  Laterality: Right;   FRACTURE SURGERY     IR IMAGING GUIDED PORT INSERTION  02/20/2019   left wrist surgery  2008   By Dr. Latanya Maudlin   right ankle  1994    SOCIAL HISTORY: Social History   Socioeconomic History   Marital status: Married    Spouse name: Not on file   Number of children: 1   Years of education: Not on file   Highest education level: Not on file  Occupational History    Employer: [employer]  Tobacco Use   Smoking status: Never   Smokeless tobacco: Never   Tobacco comments:    Lives with partner [name]) and son  Vaping Use   Vaping Use: Never used  Substance and Sexual Activity   Alcohol use: No    Alcohol/week: 0.0 standard drinks of alcohol   Drug use: No   Sexual activity: Never    Partners: Female    Birth control/protection: Post-menopausal    Comment: Lives with female partner (annette hicks) and 66 yo son  Other  Topics Concern   Not on file  Social History Narrative   Not on file   Social Determinants of Health   Financial Resource Strain: Low Risk  (02/27/2022)   Overall Financial Resource Strain (CARDIA)    Difficulty of Paying Living Expenses: Not hard at all  Food Insecurity: No Food Insecurity (02/27/2022)   Hunger Vital Sign    Worried About Running Out of Food in the Last Year: Never true    Ran Out of Food in the Last Year: Never true  Transportation  Needs: No Transportation Needs (02/27/2022)   PRAPARE - Hydrologist (Medical): No    Lack of Transportation (Non-Medical): No  Physical Activity: Unknown (02/27/2022)   Exercise Vital Sign    Days of Exercise per Week: Patient refused    Minutes of Exercise per Session: Patient refused  Stress: No Stress Concern Present (02/27/2022)   [place]    Feeling of Stress : Not at all  Social Connections: Unknown (02/27/2022)   Social Connection and Isolation Panel [NHANES]    Frequency of Communication with Friends and Family: Patient refused    Frequency of Social Gatherings with Friends and Family: Patient refused    Attends Religious Services: Patient refused    Active Member of Clubs or Organizations: Patient refused    Attends Archivist Meetings: Patient refused    Marital Status: Living with partner  Intimate Partner Violence: Not At Risk (05/08/2021)   Humiliation, Afraid, Rape, and Kick questionnaire    Fear of Current or Ex-Partner: No    Emotionally Abused: No    Physically Abused: No    Sexually Abused: No    FAMILY HISTORY: Family History  Problem Relation Age of Onset   Diabetes Father    Hyperlipidemia Father    Heart disease Father    Cancer Father    Hypertension Father    Colon cancer Paternal Grandmother 29   Osteoporosis Mother    Protein S deficiency Mother    Hyperlipidemia Mother    Multiple sclerosis Daughter    Cancer Other        bladder   Breast cancer Neg Hx     ALLERGIES:  is allergic to levofloxacin, penicillins, aleve [naproxen sodium], and sulfonamide derivatives.  MEDICATIONS:  Current Outpatient Medications  Medication Sig Dispense Refill   acyclovir (ZOVIRAX) 400 MG tablet Take 1 tablet (400 mg total) by mouth 2 (two) times daily. 60 tablet 5   Blood Glucose Monitoring Suppl (FREESTYLE FREEDOM LITE) W/DEVICE KIT Use to check blood sugars  twice a day Dx 250.00 1 each 0   Calcium Carbonate-Vitamin D 600-400 MG-UNIT tablet Take 1 tablet by mouth 2 (two) times daily.     dexamethasone (DECADRON) 4 MG tablet Take 5 tablets (20 mg total) by mouth once a week. On D22 of each cycle of treatment 20 tablet 5   ELIQUIS 2.5 MG TABS tablet TAKE 1 TABLET(2.5 MG) BY MOUTH TWICE DAILY 60 tablet 2   fentaNYL (DURAGESIC) 12 MCG/HR Place 1 patch onto the skin every 3 (three) days. 10 patch 0   fluticasone (FLONASE) 50 MCG/ACT nasal spray Place 1 spray into both nostrils daily. 16 g 2   glucose blood (FREESTYLE LITE) test strip CHECK BLOOD SUGAR TWICE DAILY AS DIRECTED Dx 250.00 180 each 3   Lancets (FREESTYLE) lancets Use twice daily to check sugars. 100 each 11   lenalidomide (REVLIMID) 5 MG capsule  TAKE 1 CAPSULE BY MOUTH DAILY  FOR 21 DAYS, THEN 7 DAYS OFF 21 capsule 0   lidocaine-prilocaine (EMLA) cream APPLY 1 APPLICATION TO THE AFFECTED AREA AS NEEDED. USE PRIOR TO PORT ACCESS 30 g 0   meclizine (ANTIVERT) 25 MG tablet Take 1 tablet (25 mg total) by mouth every 8 (eight) hours as needed for dizziness. 20 tablet 0   metFORMIN (GLUCOPHAGE-XR) 500 MG 24 hr tablet Take 3 tablets (1,500 mg total) by mouth daily. Annual appt due in Jan must see provider for future refills 180 tablet 0   Multiple Vitamins-Minerals (ICAPS) CAPS Take 1 capsule by mouth daily after breakfast.     ondansetron (ZOFRAN) 8 MG tablet Take 1 tablet (8 mg total) by mouth 2 (two) times daily as needed (Nausea or vomiting). 30 tablet 1   Oxycodone HCl 10 MG TABS Take 1 tablet (10 mg total) by mouth every 6 (six) hours as needed. 90 tablet 0   polyethylene glycol (MIRALAX / GLYCOLAX) packet Take 17 g by mouth daily after breakfast.     potassium chloride SA (KLOR-CON M) 20 MEQ tablet TAKE 2 TABLETS BY MOUTH TWICE DAILY 360 tablet 1   prochlorperazine (COMPAZINE) 10 MG tablet Take 1 tablet (10 mg total) by mouth every 6 (six) hours as needed (Nausea or vomiting). 30 tablet 1    senna-docusate (SENNA S) 8.6-50 MG tablet Take 2 tablets by mouth at bedtime. 60 tablet 2   sertraline (ZOLOFT) 100 MG tablet Take 1 tablet (100 mg total) by mouth daily. 90 tablet 3   simvastatin (ZOCOR) 20 MG tablet TAKE 1 TABLET BY MOUTH EVERY DAY AT 6 PM Follow-up appt due must see provider for future refills 90 tablet 0   Vitamin D, Ergocalciferol, (DRISDOL) 1.25 MG (50000 UNIT) CAPS capsule TAKE 1 CAPSULE BY MOUTH EVERY 7 DAYS 12 capsule 0   No current facility-administered medications for this visit.   Facility-Administered Medications Ordered in Other Visits  Medication Dose Route Frequency Provider Last Rate Last Admin   heparin lock flush 100 unit/mL  500 Units Intracatheter Once PRN Brunetta Genera, MD       sodium chloride flush (NS) 0.9 % injection 10 mL  10 mL Intracatheter PRN Brunetta Genera, MD   10 mL at 10/02/19 1524    REVIEW OF SYSTEMS:   10 Point review of Systems was done is negative except as noted above.  PHYSICAL EXAMINATION: ECOG FS:1 - Symptomatic but completely ambulatory  Vitals:   10/23/22 1312  BP: (!) 145/71  Pulse: 77  Resp: 15  Temp: 98 F (36.7 C)  SpO2: 98%    Wt Readings from Last 3 Encounters:  10/23/22 150 lb 9.6 oz (68.3 kg)  10/09/22 151 lb 4.8 oz (68.6 kg)  09/25/22 152 lb (68.9 kg)   Body mass index is 30.42 kg/m.    NAD GENERAL:alert, in no acute distress and comfortable SKIN: no acute rashes, no significant lesions EYES: conjunctiva are pink and non-injected, sclera anicteric OROPHARYNX: MMM, no exudates, no oropharyngeal erythema or ulceration NECK: supple, no JVD LYMPH:  no palpable lymphadenopathy in the cervical regions LUNGS: clear to auscultation b/l with normal respiratory effort HEART: regular rate & rhythm Extremity: no pedal edema PSYCH: alert & oriented x 3 with fluent speech NEURO: no focal motor/sensory deficits  .Marland Kitchen    Latest Ref Rng & Units 10/23/2022   12:51 PM 10/09/2022   10:12 AM 09/25/2022     8:25 AM  CBC  WBC  4.0 - 10.5 K/uL 3.6  3.3  3.3   Hemoglobin 12.0 - 15.0 g/dL 11.8  11.5  11.9   Hematocrit 36.0 - 46.0 % 34.9  34.3  35.5   Platelets 150 - 400 K/uL 137  95  142     . CBC    Component Value Date/Time   WBC 3.6 (L) 10/23/2022 1251   WBC 2.2 (L) 12/11/2021 2254   RBC 3.39 (L) 10/23/2022 1251   HGB 11.8 (L) 10/23/2022 1251   HCT 34.9 (L) 10/23/2022 1251   PLT 137 (L) 10/23/2022 1251   MCV 102.9 (H) 10/23/2022 1251   MCH 34.8 (H) 10/23/2022 1251   MCHC 33.8 10/23/2022 1251   RDW 14.9 10/23/2022 1251   LYMPHSABS 0.9 10/23/2022 1251   MONOABS 0.4 10/23/2022 1251   EOSABS 0.1 10/23/2022 1251   BASOSABS 0.0 10/23/2022 1251  .    Latest Ref Rng & Units 10/09/2022   10:12 AM 09/25/2022    8:25 AM 09/11/2022   12:17 PM  CMP  Glucose 70 - 99 mg/dL 194  156  199   BUN 8 - 23 mg/dL _0 Creatinine 0.44 - 1.00 mg/dL 1.04  1.04  0.93   Sodium 135 - 145 mmol/L 140  139  138   Potassium 3.5 - 5.1 mmol/L 3.0  3.3  3.3   Chloride 98 - 111 mmol/L 112  113  112   CO2 22 - 32 mmol/L _1 Calcium 8.9 - 10.3 mg/dL 8.5  8.6  7.9   Total Protein 6.5 - 8.1 g/dL 6.1  6.2  5.9   Total Bilirubin 0.3 - 1.2 mg/dL 0.9  0.6  0.8   Alkaline Phos 38 - 126 U/L 96  106  93   AST 15 - 41 U/L _2 ALT 0 - 44 U/L _3 09/18/2019 BM Bx Report ([id number])   09/18/2019 FISH Panel    05/30/2019 BM Bx   01/06/2019 BM Bx:     01/06/19 Cytogenetics:      05/30/19 BM Biopsy:   09/18/2019 FISH Panel    09/18/2019 BM Surgical Pathology ([id number])     RADIOGRAPHIC STUDIES: I have personally reviewed the radiological images as listed and agreed with the findings in the report. No results found.   ASSESSMENT & PLAN:   81 y.o. female with  1.  Nonsecretory multiple Myeloma, RISS Stage III  Labs upon initial presentation from 12/08/18, blood counts are normal including WBC at 7.1k, HGB at 13.1, and PLT at 245k. Calcium  normal at 10.3. Creatinine normal at 0.63. M spike at 0.5g. 12/13/18 Bone Scan revealed Multifocal uptake throughout the skeleton, consistent with diffuse metastatic disease. Primary tumor is not specified. 2. Uptake in the proximal right femur, consistent with lytic lesions. 3. Uptake in the ribs bilaterally as described. 4. Lesions in the proximal left humerus. 5. Diffuse uptake throughout the skull consistent with metastatic disease. 6. Right paramedian uptake at the manubrium.  12/13/18 CT Right Femur revealed Numerous lytic lesions involving the right femur and a lytic lesion in the left inferior pubic ramus. Overall appearance is most concerning for multiple myeloma  12/27/18 Pretreatment 24hour UPEP observed an M spike at 62m, and showed 1939mtotal protein/day.  12/27/18 Pretreatment MMP revealed M Protein at 0.5g with IgG Lambda specificity. Kappa:Lambda light chain ratio at 0.13, with Lambda at  40.3. There is less abnormal protein and light chains than I would expect from 30% plasma cells, which suggests hypo-secretory or non-secretory neoplastic plasma cells. Will have an impact in assessing response. 01/05/19 PET/CT revealed Innumerable lytic lesions in the skeleton compatible with myeloma. Most of the larger lesions are hypermetabolic, for example including a left proximal humeral shaft lesion with maximum SUV of 8.1 and a 2.8 cm lesion in the left T9 vertebral body with maximum SUV 5.1. Most of the smaller lytic lesions, and some of the larger lesions, do not demonstrate accentuated metabolic activity. 2. 1.2 cm in short axis lymph node in the left parapharyngeal space is hypermetabolic with maximum SUV 11.8. I do not see a separate mass in the head and neck to give rise to this hypermetabolic lymph node. 3. Mosaic attenuation in the lower lobes, nonspecific possibly from air trapping. 4.  Aortic Atherosclerosis 5. Heterogeneous activity in the liver, making it hard to exclude small liver  lesions. Consider hepatic protocol MRI with and without contrast for definitive assessment. Nonobstructive right nephrolithiasis. Old granulomatous disease  01/06/19 Bone Marrow biopsy revealed interstitial increase in plasma cells (28% aspirate, 40% CD138 immunohistochemistry). Plasma cells negative for light chains consistent with a non or weakly secretory myeloma   01/06/19 Cytogenetics revealed 37% of cells with trisomy 11 or 11q deletion, and 40.5% of cells with 17p mutation  S/p 5 cycles of KRD treatment  05/31/19 BM Biopsy revealed mild atypical plasmacytosis at 5% with polytypic variation.   06/01/19 PET/CT revealed "Dominant lesion in the LEFT humerus is decreased significantly in metabolic activity. Additional hypermetabolic skeletal lytic lesions have decreased in metabolic activity or similar to comparison exam (01/05/2019). No evidence of disease progression. 2. Multiple additional lytic lesions do not have metabolic activity and unchanged. 3. No new skeletal lesions are identified. No soft tissue plasmacytoma identified. 4. Nodule / node in the LEFT parapharyngeal space which is intensely hypermetabolic not changed from prior. 5. New hypermetabolic LEFT lower lobe pulmonary nodule is indeterminate. Recommend close attention on follow-up 6. New obstructive hydronephrosis of the RIGHT kidney related to RIGHT UPJ stone."  09/18/2019 BM Bx Report which revealed "Slightly hypercellular bone marrow for age with trilineage hematopoiesis and 1% plasma cells."  09/14/2019 PET/CT Whole Body Scan ([id number]) which revealed "1. There widespread tiny lytic lesions compatible with multiple myeloma. Index larger lesions are generally similar to the prior exam, with low-grade activity such as the left T9 vertebral body lesion with maximum SUV 4.5. Is mild increase in the activity associated with a mildly sclerotic left proximal humeral lesion, maximum SUV 4.8 (previously 3.5). 2. At the site of the prior  left lower lobe nodule is currently more bandlike thickening, with maximum SUV only 1.9, probably benign, continued surveillance of this region suggested. 3. There several small but hypermetabolic lymph nodes. This includes a left parapharyngeal space node measuring 1.0 cm with maximum SUV 12.3 (stable); a left level IB lymph node measuring 0.5 cm with maximum SUV 4.8 (slightly larger than prior); and a left inguinal lymph node measuring 0.7 cm in short axis with maximum SUV 6.4 (previously 0.5 cm with maximum SUV 0.6). Significance of these lymph nodes uncertain, surveillance is recommended. 4. New 5 mm left lower lobe subpleural nodule on image 32/8, not appreciably hypermetabolic, surveillance suggested. 5. Focal subcutaneous stranding along the left perineum measuring about 2.6 by 1.1 cm on image 221/4, maximum SUV 12.5. This was not present previously and is most likely inflammatory, although given the notable  SUV, surveillance of this region is suggested. 6. Other imaging findings of potential clinical significance: Aortic Atherosclerosis (ICD10-I70.0). Coronary atherosclerosis. Old granulomatous disease. Mild right hydronephrosis due to a 7 mm right UPJ calculus. 2 mm right kidney upper pole nonobstructive renal calculus. Prominent stool throughout the colon favors constipation."  12/19/2019 Thoracic & Lumbar Spine MRI ([id number]) ([id number]) revealed "Suspected myeloma lesions at T9 and S1. No compression deformity or epidural disease.  01/18/2020 PET/CT ([id number]) which revealed "1. Stable lytic lesions throughout the skeleton. The larger lytic lesions which had mild metabolic activity on comparison exam now have background metabolic activity. No evidence of active myeloma. No evidence of progression multiple myeloma.  No plasmacytoma 3. Hypermetabolic nodules in the LEFT neck may be associated deep tissues of the LEFT parotid gland. 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 Heterogeneous liver activity -Seen on 01/05/19 PET/CT -Extra-medullary hematopoiesis vs metabolic liver disease vs hepatic malignancy ?  -01/17/19 MRI Liver revealed Several appreciable liver lesions all have benign imaging characteristics. No MRI findings of metastatic involvement of the liver. 2. Scattered bony lesions corresponding to the lytic lesions seen at PET-CT, compatible with active myeloma. 3. Aortic Atherosclerosis.  Mild cardiomegaly. 4. Diffuse hepatic steatosis.   3. Left lower lobe pulmonary nodule -First seen on 06/01/19 PET/CT -PET/CT 08/16/8332: No hypermetabolic mediastinal or hilar nodes. No suspicious pulmonary nodules on the CT scan.  4. 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 -Has been stable on last couple of scans. -Being managed conservatively as per patient's preference.  No symptoms from this currently.  PLAN: -Labs done today were discussed in detail with the patient. Stable leukopenia/anemia with WBC 3.6, Hgb 11.8. Improving thrombocytopenia with plt 137K. Normal creatinine and LFTs.  -Mild hypokalemia with potassium of 3.4. Continue to have a potassium rich diet and continue potassium replacement. -Continue reduced dose Revlimid at 5 mg 3 weeks on 1 week off -Continue carfilzomib 36 mg/m every 2 weeks for maintenance with same supportive medications -Continue Eliquis 2.5 mg p.o. twice daily for VTE prophylaxis -Continue Zometa every 4 weeks  Follow up: --RTC in 2 weeks for labs and Carfilzomib. RTC in 4 weeks to see Dr. Janeah Kovacich Limbo for follow up visit.   All of the patient's questions were answered with apparent satisfaction. The patient knows to call the clinic with any problems, questions or concerns.   I have spent a total of 30 minutes minutes of face-to-face and non-face-to-face time, preparing to see the patient, performing a medically appropriate examination, counseling  and educating the patient, documenting clinical information in the electronic health record,  and care coordination.   Dede Query PA-C Dept of Hematology and [place] at [facility] Phone: [phone]

## 2022-10-23 NOTE — Patient Instructions (Signed)
[facility] MEDICAL ONCOLOGY  Discharge Instructions: Thank you for choosing [facility] to provide your oncology and hematology care.   If you have a lab appointment with the Cancer Center, please go directly to the Cancer Center and check in at the registration area.   Wear comfortable clothing and clothing appropriate for easy access to any Portacath or PICC line.   We strive to give you quality time with your provider. You may need to reschedule your appointment if you arrive late (15 or more minutes).  Arriving late affects you and other patients whose appointments are after yours.  Also, if you miss three or more appointments without notifying the office, you may be dismissed from the clinic at the provider's discretion.      For prescription refill requests, have your pharmacy contact our office and allow 72 hours for refills to be completed.    Today you received the following chemotherapy and/or immunotherapy agents: carfilzomib      To help prevent nausea and vomiting after your treatment, we encourage you to take your nausea medication as directed.  BELOW ARE SYMPTOMS THAT SHOULD BE REPORTED IMMEDIATELY: *FEVER GREATER THAN 100.4 F (38 C) OR HIGHER *CHILLS OR SWEATING *NAUSEA AND VOMITING THAT IS NOT CONTROLLED WITH YOUR NAUSEA MEDICATION *UNUSUAL SHORTNESS OF BREATH *UNUSUAL BRUISING OR BLEEDING *URINARY PROBLEMS (pain or burning when urinating, or frequent urination) *BOWEL PROBLEMS (unusual diarrhea, constipation, pain near the anus) TENDERNESS IN MOUTH AND THROAT WITH OR WITHOUT PRESENCE OF ULCERS (sore throat, sores in mouth, or a toothache) UNUSUAL RASH, SWELLING OR PAIN  UNUSUAL VAGINAL DISCHARGE OR ITCHING   Items with * indicate a potential emergency and should be followed up as soon as possible or go to the Emergency Department if any problems should occur.  Please show the CHEMOTHERAPY ALERT CARD or IMMUNOTHERAPY ALERT CARD at check-in  to the Emergency Department and triage nurse.  Should you have questions after your visit or need to cancel or reschedule your appointment, please contact [facility] MEDICAL ONCOLOGY  Dept: [phone]  and follow the prompts.  Office hours are 8:00 a.m. to 4:30 p.m. Monday - Friday. Please note that voicemails left after 4:00 p.m. may not be returned until the following business day.  We are closed weekends and major holidays. You have access to a nurse at all times for urgent questions. Please call the main number to the clinic Dept: [phone] and follow the prompts.   For any non-urgent questions, you may also contact your provider using MyChart. We now offer e-Visits for anyone 18 and older to request care online for non-urgent symptoms. For details visit [email or web address].   Also download the MyChart app! Go to the app store, search "MyChart", open the app, select [facility], and log in with your MyChart username and password.  Masks are optional in the cancer centers. If you would like for your care team to wear a mask while they are taking care of you, please let them know. You may have one support person who is at least 79 years old accompany you for your appointments. 

## 2022-10-26 LAB — KAPPA/LAMBDA LIGHT CHAINS
Kappa free light chain: 22.4 mg/L — ABNORMAL HIGH (ref 3.3–19.4)
Kappa, lambda light chain ratio: 1.64 (ref 0.26–1.65)
Lambda free light chains: 13.7 mg/L (ref 5.7–26.3)

## 2022-10-27 LAB — MULTIPLE MYELOMA PANEL, SERUM
Albumin SerPl Elph-Mcnc: 3.4 g/dL (ref 2.9–4.4)
Albumin/Glob SerPl: 1.4 (ref 0.7–1.7)
Alpha 1: 0.2 g/dL (ref 0.0–0.4)
Alpha2 Glob SerPl Elph-Mcnc: 0.7 g/dL (ref 0.4–1.0)
B-Globulin SerPl Elph-Mcnc: 0.9 g/dL (ref 0.7–1.3)
Gamma Glob SerPl Elph-Mcnc: 0.7 g/dL (ref 0.4–1.8)
Globulin, Total: 2.6 g/dL (ref 2.2–3.9)
IgA: 185 mg/dL (ref 64–422)
IgG (Immunoglobin G), Serum: 621 mg/dL (ref 586–1602)
IgM (Immunoglobulin M), Srm: 23 mg/dL — ABNORMAL LOW (ref 26–217)
Total Protein ELP: 6 g/dL (ref 6.0–8.5)

## 2022-11-04 ENCOUNTER — Other Ambulatory Visit: Payer: Self-pay | Admitting: Hematology

## 2022-11-04 ENCOUNTER — Other Ambulatory Visit: Payer: Self-pay

## 2022-11-04 ENCOUNTER — Encounter: Payer: Self-pay | Admitting: Hematology

## 2022-11-04 DIAGNOSIS — C9000 Multiple myeloma not having achieved remission: Secondary | ICD-10-CM

## 2022-11-06 ENCOUNTER — Inpatient Hospital Stay: Payer: Medicare PPO

## 2022-11-06 ENCOUNTER — Other Ambulatory Visit: Payer: Self-pay

## 2022-11-06 VITALS — BP 142/65 | HR 74 | Temp 98.2°F | Resp 18 | Wt 148.4 lb

## 2022-11-06 DIAGNOSIS — Z79899 Other long term (current) drug therapy: Secondary | ICD-10-CM | POA: Diagnosis not present

## 2022-11-06 DIAGNOSIS — C9000 Multiple myeloma not having achieved remission: Secondary | ICD-10-CM

## 2022-11-06 DIAGNOSIS — Z23 Encounter for immunization: Secondary | ICD-10-CM | POA: Diagnosis not present

## 2022-11-06 DIAGNOSIS — Z5112 Encounter for antineoplastic immunotherapy: Secondary | ICD-10-CM | POA: Diagnosis not present

## 2022-11-06 DIAGNOSIS — Z95828 Presence of other vascular implants and grafts: Secondary | ICD-10-CM

## 2022-11-06 DIAGNOSIS — Z7189 Other specified counseling: Secondary | ICD-10-CM

## 2022-11-06 LAB — CMP (CANCER CENTER ONLY)
ALT: 9 U/L (ref 0–44)
AST: 8 U/L — ABNORMAL LOW (ref 15–41)
Albumin: 3.9 g/dL (ref 3.5–5.0)
Alkaline Phosphatase: 98 U/L (ref 38–126)
Anion gap: 7 (ref 5–15)
BUN: 13 mg/dL (ref 8–23)
CO2: 21 mmol/L — ABNORMAL LOW (ref 22–32)
Calcium: 9.5 mg/dL (ref 8.9–10.3)
Chloride: 112 mmol/L — ABNORMAL HIGH (ref 98–111)
Creatinine: 1.08 mg/dL — ABNORMAL HIGH (ref 0.44–1.00)
GFR, Estimated: 52 mL/min — ABNORMAL LOW (ref >60)
Glucose, Bld: 180 mg/dL — ABNORMAL HIGH (ref 70–99)
Potassium: 3.5 mmol/L (ref 3.5–5.1)
Sodium: 140 mmol/L (ref 135–145)
Total Bilirubin: 0.7 mg/dL (ref 0.3–1.2)
Total Protein: 6.1 g/dL — ABNORMAL LOW (ref 6.5–8.1)

## 2022-11-06 LAB — CBC WITH DIFFERENTIAL (CANCER CENTER ONLY)
Abs Immature Granulocytes: 0.01 10*3/uL (ref 0.00–0.07)
Basophils Absolute: 0 10*3/uL (ref 0.0–0.1)
Basophils Relative: 1 %
Eosinophils Absolute: 0.2 10*3/uL (ref 0.0–0.5)
Eosinophils Relative: 7 %
HCT: 34.3 % — ABNORMAL LOW (ref 36.0–46.0)
Hemoglobin: 11.6 g/dL — ABNORMAL LOW (ref 12.0–15.0)
Immature Granulocytes: 0 %
Lymphocytes Relative: 35 %
Lymphs Abs: 0.9 10*3/uL (ref 0.7–4.0)
MCH: 34.7 pg — ABNORMAL HIGH (ref 26.0–34.0)
MCHC: 33.8 g/dL (ref 30.0–36.0)
MCV: 102.7 fL — ABNORMAL HIGH (ref 80.0–100.0)
Monocytes Absolute: 0.7 10*3/uL (ref 0.1–1.0)
Monocytes Relative: 28 %
Neutro Abs: 0.7 10*3/uL — ABNORMAL LOW (ref 1.7–7.7)
Neutrophils Relative %: 29 %
Platelet Count: 101 10*3/uL — ABNORMAL LOW (ref 150–400)
RBC: 3.34 MIL/uL — ABNORMAL LOW (ref 3.87–5.11)
RDW: 14.8 % (ref 11.5–15.5)
WBC Count: 2.4 10*3/uL — ABNORMAL LOW (ref 4.0–10.5)
nRBC: 0 % (ref 0.0–0.2)

## 2022-11-06 MED ORDER — SODIUM CHLORIDE 0.9 % IV SOLN: Freq: Once | INTRAVENOUS | Status: AC

## 2022-11-06 MED ORDER — ZOLEDRONIC ACID 4 MG/100ML IV SOLN
4.0000 mg | Freq: Once | INTRAVENOUS | Status: AC
  Administered 2022-11-06: 4 mg via INTRAVENOUS
  Filled 2022-11-06: qty 100

## 2022-11-06 MED ORDER — DEXAMETHASONE 4 MG PO TABS
12.0000 mg | ORAL_TABLET | Freq: Once | ORAL | Status: AC
  Administered 2022-11-06: 12 mg via ORAL
  Filled 2022-11-06: qty 3

## 2022-11-06 MED ORDER — DEXTROSE 5 % IV SOLN
36.0000 mg/m2 | Freq: Once | INTRAVENOUS | Status: AC
  Administered 2022-11-06: 60 mg via INTRAVENOUS
  Filled 2022-11-06: qty 30

## 2022-11-06 MED ORDER — HEPARIN SOD (PORK) LOCK FLUSH 100 UNIT/ML IV SOLN
500.0000 [IU] | Freq: Once | INTRAVENOUS | Status: AC | PRN
  Administered 2022-11-06: 500 [IU]

## 2022-11-06 MED ORDER — ACETAMINOPHEN 325 MG PO TABS
650.0000 mg | ORAL_TABLET | Freq: Once | ORAL | Status: AC
  Administered 2022-11-06: 650 mg via ORAL
  Filled 2022-11-06: qty 2

## 2022-11-06 MED ORDER — PROCHLORPERAZINE MALEATE 10 MG PO TABS
10.0000 mg | ORAL_TABLET | Freq: Once | ORAL | Status: AC
  Administered 2022-11-06: 10 mg via ORAL
  Filled 2022-11-06: qty 1

## 2022-11-06 MED ORDER — FAMOTIDINE 20 MG PO TABS
20.0000 mg | ORAL_TABLET | Freq: Once | ORAL | Status: AC
  Administered 2022-11-06: 20 mg via ORAL
  Filled 2022-11-06: qty 1

## 2022-11-06 MED ORDER — DIPHENHYDRAMINE HCL 25 MG PO CAPS
25.0000 mg | ORAL_CAPSULE | Freq: Once | ORAL | Status: AC
  Administered 2022-11-06: 25 mg via ORAL
  Filled 2022-11-06: qty 1

## 2022-11-06 MED ORDER — SODIUM CHLORIDE 0.9% FLUSH
10.0000 mL | INTRAVENOUS | Status: DC | PRN
  Administered 2022-11-06: 10 mL

## 2022-11-06 MED ORDER — SODIUM CHLORIDE 0.9% FLUSH
10.0000 mL | Freq: Once | INTRAVENOUS | Status: AC
  Administered 2022-11-06: 10 mL

## 2022-11-06 NOTE — Progress Notes (Signed)
Per dr Lorenso Courier, ok to treat with ANC 0.7 today

## 2022-11-06 NOTE — Patient Instructions (Signed)
[facility] MEDICAL ONCOLOGY  Discharge Instructions: Thank you for choosing [facility] to provide your oncology and hematology care.   If you have a lab appointment with the Cancer Center, please go directly to the Cancer Center and check in at the registration area.   Wear comfortable clothing and clothing appropriate for easy access to any Portacath or PICC line.   We strive to give you quality time with your provider. You may need to reschedule your appointment if you arrive late (15 or more minutes).  Arriving late affects you and other patients whose appointments are after yours.  Also, if you miss three or more appointments without notifying the office, you may be dismissed from the clinic at the provider's discretion.      For prescription refill requests, have your pharmacy contact our office and allow 72 hours for refills to be completed.    Today you received the following chemotherapy and/or immunotherapy agents: carfilzomib      To help prevent nausea and vomiting after your treatment, we encourage you to take your nausea medication as directed.  BELOW ARE SYMPTOMS THAT SHOULD BE REPORTED IMMEDIATELY: *FEVER GREATER THAN 100.4 F (38 C) OR HIGHER *CHILLS OR SWEATING *NAUSEA AND VOMITING THAT IS NOT CONTROLLED WITH YOUR NAUSEA MEDICATION *UNUSUAL SHORTNESS OF BREATH *UNUSUAL BRUISING OR BLEEDING *URINARY PROBLEMS (pain or burning when urinating, or frequent urination) *BOWEL PROBLEMS (unusual diarrhea, constipation, pain near the anus) TENDERNESS IN MOUTH AND THROAT WITH OR WITHOUT PRESENCE OF ULCERS (sore throat, sores in mouth, or a toothache) UNUSUAL RASH, SWELLING OR PAIN  UNUSUAL VAGINAL DISCHARGE OR ITCHING   Items with * indicate a potential emergency and should be followed up as soon as possible or go to the Emergency Department if any problems should occur.  Please show the CHEMOTHERAPY ALERT CARD or IMMUNOTHERAPY ALERT CARD at check-in  to the Emergency Department and triage nurse.  Should you have questions after your visit or need to cancel or reschedule your appointment, please contact [facility]  Dept: [phone]  and follow the prompts.  Office hours are 8:00 a.m. to 4:30 p.m. Monday - Friday. Please note that voicemails left after 4:00 p.m. may not be returned until the following business day.  We are closed weekends and major holidays. You have access to a nurse at all times for urgent questions. Please call the main number to the clinic Dept: [phone] and follow the prompts.   For any non-urgent questions, you may also contact your provider using MyChart. We now offer e-Visits for anyone 18 and older to request care online for non-urgent symptoms. For details visit [email or web address].   Also download the MyChart app! Go to the app store, search "MyChart", open the app, select [facility], and log in with your MyChart username and password.  Masks are optional in the cancer centers. If you would like for your care team to wear a mask while they are taking care of you, please let them know. You may have one support person who is at least 79 years old accompany you for your appointments. 

## 2022-11-10 MED ORDER — OXYCODONE HCL 10 MG PO TABS: 10.0000 mg | ORAL_TABLET | Freq: Four times a day (QID) | ORAL | 0 refills | Status: DC | PRN

## 2022-11-19 ENCOUNTER — Other Ambulatory Visit: Payer: Self-pay

## 2022-11-19 DIAGNOSIS — C9000 Multiple myeloma not having achieved remission: Secondary | ICD-10-CM

## 2022-11-20 ENCOUNTER — Other Ambulatory Visit: Payer: Self-pay

## 2022-11-20 ENCOUNTER — Inpatient Hospital Stay: Payer: Medicare PPO | Admitting: Hematology

## 2022-11-20 ENCOUNTER — Inpatient Hospital Stay: Payer: Medicare PPO

## 2022-11-20 ENCOUNTER — Inpatient Hospital Stay: Payer: Medicare PPO | Attending: Hematology

## 2022-11-20 VITALS — BP 127/64 | HR 78 | Temp 97.9°F | Resp 18 | Ht 59.0 in | Wt 150.7 lb

## 2022-11-20 DIAGNOSIS — C9000 Multiple myeloma not having achieved remission: Secondary | ICD-10-CM

## 2022-11-20 DIAGNOSIS — Z79899 Other long term (current) drug therapy: Secondary | ICD-10-CM | POA: Insufficient documentation

## 2022-11-20 DIAGNOSIS — Z5112 Encounter for antineoplastic immunotherapy: Secondary | ICD-10-CM | POA: Diagnosis not present

## 2022-11-20 DIAGNOSIS — Z95828 Presence of other vascular implants and grafts: Secondary | ICD-10-CM

## 2022-11-20 DIAGNOSIS — Z7189 Other specified counseling: Secondary | ICD-10-CM

## 2022-11-20 LAB — CBC WITH DIFFERENTIAL (CANCER CENTER ONLY)
Abs Immature Granulocytes: 0.01 10*3/uL (ref 0.00–0.07)
Basophils Absolute: 0 10*3/uL (ref 0.0–0.1)
Basophils Relative: 1 %
Eosinophils Absolute: 0.1 10*3/uL (ref 0.0–0.5)
Eosinophils Relative: 2 %
HCT: 34.1 % — ABNORMAL LOW (ref 36.0–46.0)
Hemoglobin: 11.5 g/dL — ABNORMAL LOW (ref 12.0–15.0)
Immature Granulocytes: 0 %
Lymphocytes Relative: 27 %
Lymphs Abs: 1 10*3/uL (ref 0.7–4.0)
MCH: 35.3 pg — ABNORMAL HIGH (ref 26.0–34.0)
MCHC: 33.7 g/dL (ref 30.0–36.0)
MCV: 104.6 fL — ABNORMAL HIGH (ref 80.0–100.0)
Monocytes Absolute: 0.6 10*3/uL (ref 0.1–1.0)
Monocytes Relative: 15 %
Neutro Abs: 2.1 10*3/uL (ref 1.7–7.7)
Neutrophils Relative %: 55 %
Platelet Count: 143 10*3/uL — ABNORMAL LOW (ref 150–400)
RBC: 3.26 MIL/uL — ABNORMAL LOW (ref 3.87–5.11)
RDW: 14.6 % (ref 11.5–15.5)
WBC Count: 3.8 10*3/uL — ABNORMAL LOW (ref 4.0–10.5)
nRBC: 0 % (ref 0.0–0.2)

## 2022-11-20 LAB — CMP (CANCER CENTER ONLY)
ALT: 9 U/L (ref 0–44)
AST: 11 U/L — ABNORMAL LOW (ref 15–41)
Albumin: 3.7 g/dL (ref 3.5–5.0)
Alkaline Phosphatase: 100 U/L (ref 38–126)
Anion gap: 7 (ref 5–15)
BUN: 13 mg/dL (ref 8–23)
CO2: 20 mmol/L — ABNORMAL LOW (ref 22–32)
Calcium: 9.3 mg/dL (ref 8.9–10.3)
Chloride: 112 mmol/L — ABNORMAL HIGH (ref 98–111)
Creatinine: 0.92 mg/dL (ref 0.44–1.00)
GFR, Estimated: >60 mL/min (ref >60)
Glucose, Bld: 160 mg/dL — ABNORMAL HIGH (ref 70–99)
Potassium: 3.2 mmol/L — ABNORMAL LOW (ref 3.5–5.1)
Sodium: 139 mmol/L (ref 135–145)
Total Bilirubin: 0.6 mg/dL (ref 0.3–1.2)
Total Protein: 6 g/dL — ABNORMAL LOW (ref 6.5–8.1)

## 2022-11-20 MED ORDER — SODIUM CHLORIDE 0.9% FLUSH
10.0000 mL | INTRAVENOUS | Status: DC | PRN
  Administered 2022-11-20: 10 mL

## 2022-11-20 MED ORDER — SODIUM CHLORIDE 0.9 % IV SOLN: Freq: Once | INTRAVENOUS | Status: AC

## 2022-11-20 MED ORDER — DIPHENHYDRAMINE HCL 25 MG PO CAPS
25.0000 mg | ORAL_CAPSULE | Freq: Once | ORAL | Status: AC
  Administered 2022-11-20: 25 mg via ORAL
  Filled 2022-11-20: qty 1

## 2022-11-20 MED ORDER — DEXAMETHASONE 4 MG PO TABS
12.0000 mg | ORAL_TABLET | Freq: Once | ORAL | Status: AC
  Administered 2022-11-20: 12 mg via ORAL
  Filled 2022-11-20: qty 3

## 2022-11-20 MED ORDER — SODIUM CHLORIDE 0.9% FLUSH
10.0000 mL | Freq: Once | INTRAVENOUS | Status: AC
  Administered 2022-11-20: 10 mL

## 2022-11-20 MED ORDER — ACETAMINOPHEN 325 MG PO TABS
650.0000 mg | ORAL_TABLET | Freq: Once | ORAL | Status: AC
  Administered 2022-11-20: 650 mg via ORAL
  Filled 2022-11-20: qty 2

## 2022-11-20 MED ORDER — HEPARIN SOD (PORK) LOCK FLUSH 100 UNIT/ML IV SOLN
500.0000 [IU] | Freq: Once | INTRAVENOUS | Status: AC | PRN
  Administered 2022-11-20: 500 [IU]

## 2022-11-20 MED ORDER — DEXTROSE 5 % IV SOLN
36.0000 mg/m2 | Freq: Once | INTRAVENOUS | Status: AC
  Administered 2022-11-20: 60 mg via INTRAVENOUS
  Filled 2022-11-20: qty 30

## 2022-11-20 MED ORDER — FAMOTIDINE 20 MG PO TABS
20.0000 mg | ORAL_TABLET | Freq: Once | ORAL | Status: AC
  Administered 2022-11-20: 20 mg via ORAL
  Filled 2022-11-20: qty 1

## 2022-11-20 MED ORDER — PROCHLORPERAZINE MALEATE 10 MG PO TABS
10.0000 mg | ORAL_TABLET | Freq: Once | ORAL | Status: AC
  Administered 2022-11-20: 10 mg via ORAL
  Filled 2022-11-20: qty 1

## 2022-11-20 NOTE — Progress Notes (Signed)
  HEMATOLOGY/ONCOLOGY CLINIC NOTE  Date of Service: 11/20/22    Patient Care Team: Crawford, Elizabeth A, MD as PCP - General (Internal Medicine) Brodie, Dora M, MD (Inactive) as Consulting Physician (Gastroenterology) Miller, Mary S, MD as Consulting Physician (Gynecology) Dalldorf, Peter, MD as Consulting Physician (Orthopedic Surgery) Young, Clinton D, MD as Consulting Physician (Pulmonary Disease) Hecker, Kathryn, MD (Ophthalmology)  CHIEF COMPLAINTS/PURPOSE OF VISIT:  Follow-up for continued evaluation and management of multiple myeloma  HISTORY OF PRESENTING ILLNESS:  Please see previous notes for details on initial presentation  Current Treatment: Carfilzomib + Revlimid maintenance.  INTERVAL HISTORY:   Faith Orr is a 81 y.o. female is here for continued evaluation and management of the patient's multiple myeloma.  Patient was doing well at her last visit with me on 10/09/22.  Today, she states that she is doing well. Her fatigue is manageable. Her chronic back pain is well controlled with Oxycodone.  No notable new toxicity from her carfilzomib plus Revlimid maintenance.  She has received her annual flu and COVID vaccines. She is planning to get the RSV vaccine soon.  She denies signs of infection such as sore throat, sinus drainage, cough, or urinary symptoms.  She denies fevers or recurrent chills. She denies new pain. She denies nausea, vomiting, chest pain, dyspnea or cough.   She does have a light appetite due to chronic taste changes. However, she is eating 3 full meals a day, with lighter meals after breakfast. Her weight has been stable.   MEDICAL HISTORY:  Past Medical History:  Diagnosis Date   Allergy    seasonal   Asthma    DEPRESSION    DIABETES MELLITUS, TYPE II    Diverticulosis    HYPERLIPIDEMIA    Macular degeneration of left eye    mild, Dr.Hecker   Obesity, unspecified    Osteoarthritis of both knees    OSTEOPENIA     Osteopenia    URINARY INCONTINENCE     SURGICAL HISTORY: Past Surgical History:  Procedure Laterality Date   CATARACT EXTRACTION Left 05/24/2018   CESAREAN SECTION  01/1973   CYSTOSCOPY/URETEROSCOPY/HOLMIUM LASER/STENT PLACEMENT Right 09/20/2019   Procedure: CYSTOSCOPY/URETEROSCOPY/HOLMIUM LASER/STENT PLACEMENT;  Surgeon: Bell, Eugene D III, MD;  Location: [facility];  Service: Urology;  Laterality: Right;   FRACTURE SURGERY     IR IMAGING GUIDED PORT INSERTION  02/20/2019   left wrist surgery  2008   By Dr. Daldorf   right ankle  1994    SOCIAL HISTORY: Social History   Socioeconomic History   Marital status: Married    Spouse name: Not on file   Number of children: 1   Years of education: Not on file   Highest education level: Not on file  Occupational History    Employer: [employer]  Tobacco Use   Smoking status: Never   Smokeless tobacco: Never   Tobacco comments:    Lives with partner ([name]) and son  Vaping Use   Vaping Use: Never used  Substance and Sexual Activity   Alcohol use: No    Alcohol/week: 0.0 standard drinks of alcohol   Drug use: No   Sexual activity: Never    Partners: Female    Birth control/protection: Post-menopausal    Comment: Lives with female partner (annette hicks) and 13 yo son  Other Topics Concern   Not on file  Social History Narrative   Not on file   Social Determinants of Health   Financial   Resource Strain: Low Risk  (02/27/2022)   Overall Financial Resource Strain (CARDIA)    Difficulty of Paying Living Expenses: Not hard at all  Food Insecurity: No Food Insecurity (02/27/2022)   Hunger Vital Sign    Worried About Running Out of Food in the Last Year: Never true    Ran Out of Food in the Last Year: Never true  Transportation Needs: No Transportation Needs (02/27/2022)   PRAPARE - Transportation    Lack of Transportation (Medical): No    Lack of Transportation (Non-Medical): No  Physical  Activity: Unknown (02/27/2022)   Exercise Vital Sign    Days of Exercise per Week: Patient refused    Minutes of Exercise per Session: Patient refused  Stress: No Stress Concern Present (02/27/2022)   [employer] - Occupational Stress Questionnaire    Feeling of Stress : Not at all  Social Connections: Unknown (02/27/2022)   Social Connection and Isolation Panel [NHANES]    Frequency of Communication with Friends and Family: Patient refused    Frequency of Social Gatherings with Friends and Family: Patient refused    Attends Religious Services: Patient refused    Active Member of Clubs or Organizations: Patient refused    Attends Club or Organization Meetings: Patient refused    Marital Status: Living with partner  Intimate Partner Violence: Not At Risk (05/08/2021)   Humiliation, Afraid, Rape, and Kick questionnaire    Fear of Current or Ex-Partner: No    Emotionally Abused: No    Physically Abused: No    Sexually Abused: No    FAMILY HISTORY: Family History  Problem Relation Age of Onset   Diabetes Father    Hyperlipidemia Father    Heart disease Father    Cancer Father    Hypertension Father    Colon cancer Paternal Grandmother 90   Osteoporosis Mother    Protein S deficiency Mother    Hyperlipidemia Mother    Multiple sclerosis Daughter    Cancer Other        bladder   Breast cancer Neg Hx     ALLERGIES:  is allergic to levofloxacin, penicillins, aleve [naproxen sodium], and sulfonamide derivatives.  MEDICATIONS:  Current Outpatient Medications  Medication Sig Dispense Refill   acyclovir (ZOVIRAX) 400 MG tablet Take 1 tablet (400 mg total) by mouth 2 (two) times daily. 60 tablet 5   Blood Glucose Monitoring Suppl (FREESTYLE FREEDOM LITE) W/DEVICE KIT Use to check blood sugars twice a day Dx 250.00 1 each 0   Calcium Carbonate-Vitamin D 600-400 MG-UNIT tablet Take 1 tablet by mouth 2 (two) times daily.     dexamethasone (DECADRON) 4 MG  tablet Take 5 tablets (20 mg total) by mouth once a week. On D22 of each cycle of treatment 20 tablet 5   ELIQUIS 2.5 MG TABS tablet TAKE 1 TABLET(2.5 MG) BY MOUTH TWICE DAILY 60 tablet 2   fentaNYL (DURAGESIC) 12 MCG/HR Place 1 patch onto the skin every 3 (three) days. 10 patch 0   fluticasone (FLONASE) 50 MCG/ACT nasal spray Place 1 spray into both nostrils daily. 16 g 2   glucose blood (FREESTYLE LITE) test strip CHECK BLOOD SUGAR TWICE DAILY AS DIRECTED Dx 250.00 180 each 3   Lancets (FREESTYLE) lancets Use twice daily to check sugars. 100 each 11   lenalidomide (REVLIMID) 5 MG capsule TAKE 1 CAPSULE BY MOUTH DAILY  FOR 21 DAYS, THEN 7 DAYS OFF 21 capsule 0   lidocaine-prilocaine (EMLA) cream APPLY 1 APPLICATION   TO THE AFFECTED AREA AS NEEDED. USE PRIOR TO PORT ACCESS 30 g 0   meclizine (ANTIVERT) 25 MG tablet Take 1 tablet (25 mg total) by mouth every 8 (eight) hours as needed for dizziness. 20 tablet 0   metFORMIN (GLUCOPHAGE-XR) 500 MG 24 hr tablet Take 3 tablets (1,500 mg total) by mouth daily. Annual appt due in Jan must see provider for future refills 180 tablet 0   Multiple Vitamins-Minerals (ICAPS) CAPS Take 1 capsule by mouth daily after breakfast.     ondansetron (ZOFRAN) 8 MG tablet Take 1 tablet (8 mg total) by mouth 2 (two) times daily as needed (Nausea or vomiting). 30 tablet 1   Oxycodone HCl 10 MG TABS Take 1 tablet (10 mg total) by mouth every 6 (six) hours as needed. 90 tablet 0   polyethylene glycol (MIRALAX / GLYCOLAX) packet Take 17 g by mouth daily after breakfast.     potassium chloride SA (KLOR-CON M) 20 MEQ tablet TAKE 2 TABLETS BY MOUTH TWICE DAILY 360 tablet 1   prochlorperazine (COMPAZINE) 10 MG tablet Take 1 tablet (10 mg total) by mouth every 6 (six) hours as needed (Nausea or vomiting). 30 tablet 1   senna-docusate (SENNA S) 8.6-50 MG tablet Take 2 tablets by mouth at bedtime. 60 tablet 2   sertraline (ZOLOFT) 100 MG tablet Take 1 tablet (100 mg total) by mouth  daily. 90 tablet 3   simvastatin (ZOCOR) 20 MG tablet TAKE 1 TABLET BY MOUTH EVERY DAY AT 6 PM Follow-up appt due must see provider for future refills 90 tablet 0   Vitamin D, Ergocalciferol, (DRISDOL) 1.25 MG (50000 UNIT) CAPS capsule TAKE 1 CAPSULE BY MOUTH EVERY 7 DAYS 12 capsule 0   No current facility-administered medications for this visit.   Facility-Administered Medications Ordered in Other Visits  Medication Dose Route Frequency Provider Last Rate Last Admin   heparin lock flush 100 unit/mL  500 Units Intracatheter Once PRN Kale, Gautam Kishore, MD       sodium chloride flush (NS) 0.9 % injection 10 mL  10 mL Intracatheter PRN Kale, Gautam Kishore, MD   10 mL at 10/02/19 1524    REVIEW OF SYSTEMS:   10 Point review of Systems was done is negative except as noted above.  PHYSICAL EXAMINATION: ECOG FS:2 - Symptomatic, <50% confined to bed  Vitals:   11/20/22 1037  BP: 127/64  Pulse: 78  Resp: 18  Temp: 97.9 F (36.6 C)  SpO2: 99%    Wt Readings from Last 3 Encounters:  11/20/22 150 lb 11.2 oz (68.4 kg)  11/06/22 148 lb 6.4 oz (67.3 kg)  10/23/22 150 lb 9.6 oz (68.3 kg)   Body mass index is 30.44 kg/m.    GENERAL:alert, in no acute distress and comfortable SKIN: no acute rashes, no significant lesions EYES: conjunctiva are pink and non-injected, sclera anicteric OROPHARYNX: MMM, no exudates, no oropharyngeal erythema or ulceration NECK: supple, no JVD LYMPH:  no palpable lymphadenopathy in the cervical, axillary or inguinal regions LUNGS: clear to auscultation b/l with normal respiratory effort HEART: regular rate & rhythm ABDOMEN:  normoactive bowel sounds , non tender, not distended. Extremity: no pedal edema PSYCH: alert & oriented x 3 with fluent speech NEURO: no focal motor/sensory deficits      Latest Ref Rng & Units 11/20/2022   10:10 AM 11/06/2022   11:10 AM 10/23/2022   12:51 PM  CBC  WBC 4.0 - 10.5 K/uL 3.8  2.4  3.6   Hemoglobin 12.0 -   15.0  g/dL 11.5  11.6  11.8   Hematocrit 36.0 - 46.0 % 34.1  34.3  34.9   Platelets 150 - 400 K/uL 143  101  137       Latest Ref Rng & Units 11/06/2022   11:10 AM 10/23/2022   12:51 PM 10/09/2022   10:12 AM  CMP  Glucose 70 - 99 mg/dL 180  176  194   BUN 8 - 23 mg/dL 13  7  11   Creatinine 0.44 - 1.00 mg/dL 1.08  0.97  1.04   Sodium 135 - 145 mmol/L 140  137  140   Potassium 3.5 - 5.1 mmol/L 3.5  3.4  3.0   Chloride 98 - 111 mmol/L 112  109  112   CO2 22 - 32 mmol/L 21  20  20   Calcium 8.9 - 10.3 mg/dL 9.5  9.1  8.5   Total Protein 6.5 - 8.1 g/dL 6.1  6.5  6.1   Total Bilirubin 0.3 - 1.2 mg/dL 0.7  0.6  0.9   Alkaline Phos 38 - 126 U/L 98  98  96   AST 15 - 41 U/L 8  11  9   ALT 0 - 44 U/L 9  10  9      09/18/2019 BM Bx Report ([id number])   09/18/2019 FISH Panel    05/30/2019 BM Bx   01/06/2019 BM Bx:     01/06/19 Cytogenetics:      05/30/19 BM Biopsy:   09/18/2019 FISH Panel    09/18/2019 BM Surgical Pathology ([id number])     RADIOGRAPHIC STUDIES: I have personally reviewed the radiological images as listed and agreed with the findings in the report. No results found.   ASSESSMENT & PLAN:   81 y.o. female with  1.  Nonsecretory multiple Myeloma, RISS Stage III  Labs upon initial presentation from 12/08/18, blood counts are normal including WBC at 7.1k, HGB at 13.1, and PLT at 245k. Calcium normal at 10.3. Creatinine normal at 0.63. M spike at 0.5g. 12/13/18 Bone Scan revealed Multifocal uptake throughout the skeleton, consistent with diffuse metastatic disease. Primary tumor is not specified. 2. Uptake in the proximal right femur, consistent with lytic lesions. 3. Uptake in the ribs bilaterally as described. 4. Lesions in the proximal left humerus. 5. Diffuse uptake throughout the skull consistent with metastatic disease. 6. Right paramedian uptake at the manubrium.  12/13/18 CT Right Femur revealed Numerous lytic lesions involving the right  femur and a lytic lesion in the left inferior pubic ramus. Overall appearance is most concerning for multiple myeloma  12/27/18 Pretreatment 24hour UPEP observed an M spike at 18mg, and showed 199mg total protein/day.  12/27/18 Pretreatment MMP revealed M Protein at 0.5g with IgG Lambda specificity. Kappa:Lambda light chain ratio at 0.13, with Lambda at 40.3. There is less abnormal protein and light chains than I would expect from 30% plasma cells, which suggests hypo-secretory or non-secretory neoplastic plasma cells. Will have an impact in assessing response. 01/05/19 PET/CT revealed Innumerable lytic lesions in the skeleton compatible with myeloma. Most of the larger lesions are hypermetabolic, for example including a left proximal humeral shaft lesion with maximum SUV of 8.1 and a 2.8 cm lesion in the left T9 vertebral body with maximum SUV 5.1. Most of the smaller lytic lesions, and some of the larger lesions, do not demonstrate accentuated metabolic activity. 2. 1.2 cm in short axis lymph node in the left parapharyngeal space is hypermetabolic with maximum SUV 11.8.   I do not see a separate mass in the head and neck to give rise to this hypermetabolic lymph node. 3. Mosaic attenuation in the lower lobes, nonspecific possibly from air trapping. 4.  Aortic Atherosclerosis 5. Heterogeneous activity in the liver, making it hard to exclude small liver lesions. Consider hepatic protocol MRI with and without contrast for definitive assessment. Nonobstructive right nephrolithiasis. Old granulomatous disease  01/06/19 Bone Marrow biopsy revealed interstitial increase in plasma cells (28% aspirate, 40% CD138 immunohistochemistry). Plasma cells negative for light chains consistent with a non or weakly secretory myeloma   01/06/19 Cytogenetics revealed 37% of cells with trisomy 11 or 11q deletion, and 40.5% of cells with 17p mutation  S/p 5 cycles of KRD treatment  05/31/19 BM Biopsy revealed mild atypical  plasmacytosis at 5% with polytypic variation.   06/01/19 PET/CT revealed "Dominant lesion in the LEFT humerus is decreased significantly in metabolic activity. Additional hypermetabolic skeletal lytic lesions have decreased in metabolic activity or similar to comparison exam (01/05/2019). No evidence of disease progression. 2. Multiple additional lytic lesions do not have metabolic activity and unchanged. 3. No new skeletal lesions are identified. No soft tissue plasmacytoma identified. 4. Nodule / node in the LEFT parapharyngeal space which is intensely hypermetabolic not changed from prior. 5. New hypermetabolic LEFT lower lobe pulmonary nodule is indeterminate. Recommend close attention on follow-up 6. New obstructive hydronephrosis of the RIGHT kidney related to RIGHT UPJ stone."  09/18/2019 BM Bx Report which revealed "Slightly hypercellular bone marrow for age with trilineage hematopoiesis and 1% plasma cells."  09/14/2019 PET/CT Whole Body Scan ([id number]) which revealed "1. There widespread tiny lytic lesions compatible with multiple myeloma. Index larger lesions are generally similar to the prior exam, with low-grade activity such as the left T9 vertebral body lesion with maximum SUV 4.5. Is mild increase in the activity associated with a mildly sclerotic left proximal humeral lesion, maximum SUV 4.8 (previously 3.5). 2. At the site of the prior left lower lobe nodule is currently more bandlike thickening, with maximum SUV only 1.9, probably benign, continued surveillance of this region suggested. 3. There several small but hypermetabolic lymph nodes. This includes a left parapharyngeal space node measuring 1.0 cm with maximum SUV 12.3 (stable); a left level IB lymph node measuring 0.5 cm with maximum SUV 4.8 (slightly larger than prior); and a left inguinal lymph node measuring 0.7 cm in short axis with maximum SUV 6.4 (previously 0.5 cm with maximum SUV 0.6). Significance of these lymph nodes  uncertain, surveillance is recommended. 4. New 5 mm left lower lobe subpleural nodule on image 32/8, not appreciably hypermetabolic, surveillance suggested. 5. Focal subcutaneous stranding along the left perineum measuring about 2.6 by 1.1 cm on image 221/4, maximum SUV 12.5. This was not present previously and is most likely inflammatory, although given the notable SUV, surveillance of this region is suggested. 6. Other imaging findings of potential clinical significance: Aortic Atherosclerosis (ICD10-I70.0). Coronary atherosclerosis. Old granulomatous disease. Mild right hydronephrosis due to a 7 mm right UPJ calculus. 2 mm right kidney upper pole nonobstructive renal calculus. Prominent stool throughout the colon favors constipation."  12/19/2019 Thoracic & Lumbar Spine MRI ([id number]) ([id number]) revealed "Suspected myeloma lesions at T9 and S1. No compression deformity or epidural disease.  01/18/2020 PET/CT ([id number]) which revealed "1. Stable lytic lesions throughout the skeleton. The larger lytic lesions which had mild metabolic activity on comparison exam now have background metabolic activity. No evidence of active myeloma. No evidence of progression multiple myeloma.  No plasmacytoma  3. Hypermetabolic nodules in the LEFT neck may be associated deep tissues of the LEFT parotid gland. 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  01/17/19 MRI Liver revealed Several appreciable liver lesions all have benign imaging characteristics. No MRI findings of metastatic involvement of the liver. 2. Scattered bony lesions corresponding to the lytic lesions seen at PET-CT, compatible with active myeloma. 3. Aortic Atherosclerosis.  Mild cardiomegaly. 4. Diffuse hepatic steatosis.   3. Left lower lobe pulmonary  nodule First seen on 06/01/19 PET/CT PET/CT 08/27/2020: No hypermetabolic mediastinal or hilar nodes. No suspicious pulmonary nodules on the CT scan.  4. 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 Being managed conservatively as per patient's preference.  No symptoms from this currently.  5. Mild chronic kidney disease CMP done 04/09/2022 showed creatinine of 1.17 and GFR est of 48 consistent with mild chronic kidney disease.  PLAN: -Labs done today were discussed in detail with the patient. -CBC stable with mild anemia with a hemoglobin of 11.5, mild thrombocytopenia with platelets of 143K and WBC count of 3.8K. -Multiple myeloma labs and light chains pending. -CMP shows creatinine of 0.92 improved from 1.08. Mild hypokalemia with a potassium of 3.2. -Continue reduced dose Revlimid at 5 mg 3 weeks on 1 week off. -Continue carfilzomib 36 mg/m every 2 weeks for maintenance with same supportive medications -Continue Eliquis 2.5 mg p.o. twice daily for VTE prophylaxis -Continue Zometa every 4 weeks -Treatment orders were reviewed and placed  FOLLOW UP: Per integrated scheduling  The total time spent in the appointment was 20 minutes* .  All of the patient's questions were answered with apparent satisfaction. The patient knows to call the clinic with any problems, questions or concerns.  I,Alexis Herring,acting as a scribe for Gautam Kale, MD.,have documented all relevant documentation on the behalf of Gautam Kale, MD,as directed by  Gautam Kale, MD while in the presence of Gautam Kale, MD.  .I have reviewed the above documentation for accuracy and completeness, and I agree with the above.   Gautam Kale MD MS AAHIVMS [facility] Hematology/Oncology Physician [facility]  .*Total Encounter Time as defined by the Centers for Medicare and Medicaid Services includes, in addition to the  face-to-face time of a patient visit (documented in the note above) non-face-to-face time: obtaining and reviewing outside history, ordering and reviewing medications, tests or procedures, care coordination (communications with other health care professionals or caregivers) and documentation in the medical record.  

## 2022-11-20 NOTE — Patient Instructions (Signed)
[facility]  Discharge Instructions: Thank you for choosing [facility] to provide your oncology and hematology care.   If you have a lab appointment with the Cancer Center, please go directly to the Cancer Center and check in at the registration area.   Wear comfortable clothing and clothing appropriate for easy access to any Portacath or PICC line.   We strive to give you quality time with your provider. You may need to reschedule your appointment if you arrive late (15 or more minutes).  Arriving late affects you and other patients whose appointments are after yours.  Also, if you miss three or more appointments without notifying the office, you may be dismissed from the clinic at the provider's discretion.      For prescription refill requests, have your pharmacy contact our office and allow 72 hours for refills to be completed.    Today you received the following chemotherapy and/or immunotherapy agents: Kyprolis.       To help prevent nausea and vomiting after your treatment, we encourage you to take your nausea medication as directed.  BELOW ARE SYMPTOMS THAT SHOULD BE REPORTED IMMEDIATELY: *FEVER GREATER THAN 100.4 F (38 C) OR HIGHER *CHILLS OR SWEATING *NAUSEA AND VOMITING THAT IS NOT CONTROLLED WITH YOUR NAUSEA MEDICATION *UNUSUAL SHORTNESS OF BREATH *UNUSUAL BRUISING OR BLEEDING *URINARY PROBLEMS (pain or burning when urinating, or frequent urination) *BOWEL PROBLEMS (unusual diarrhea, constipation, pain near the anus) TENDERNESS IN MOUTH AND THROAT WITH OR WITHOUT PRESENCE OF ULCERS (sore throat, sores in mouth, or a toothache) UNUSUAL RASH, SWELLING OR PAIN  UNUSUAL VAGINAL DISCHARGE OR ITCHING   Items with * indicate a potential emergency and should be followed up as soon as possible or go to the Emergency Department if any problems should occur.  Please show the CHEMOTHERAPY ALERT CARD or IMMUNOTHERAPY ALERT CARD at check-in to  the Emergency Department and triage nurse.  Should you have questions after your visit or need to cancel or reschedule your appointment, please contact [facility]  Dept: [phone]  and follow the prompts.  Office hours are 8:00 a.m. to 4:30 p.m. Monday - Friday. Please note that voicemails left after 4:00 p.m. may not be returned until the following business day.  We are closed weekends and major holidays. You have access to a nurse at all times for urgent questions. Please call the main number to the clinic Dept: [phone] and follow the prompts.   For any non-urgent questions, you may also contact your provider using MyChart. We now offer e-Visits for anyone 18 and older to request care online for non-urgent symptoms. For details visit [email or web address].   Also download the MyChart app! Go to the app store, search "MyChart", open the app, select [facility], and log in with your MyChart username and password.  Masks are optional in the cancer centers. If you would like for your care team to wear a mask while they are taking care of you, please let them know. You may have one support person who is at least 79 years old accompany you for your appointments. 

## 2022-11-20 NOTE — Progress Notes (Signed)
Patient seen by MD today  Vitals are within treatment parameters.  Labs reviewed: and are within treatment parameters.  Per physician team, patient is ready for treatment and there are NO modifications to the treatment plan.  

## 2022-11-23 ENCOUNTER — Other Ambulatory Visit: Payer: Self-pay | Admitting: Hematology

## 2022-11-23 DIAGNOSIS — C9000 Multiple myeloma not having achieved remission: Secondary | ICD-10-CM

## 2022-11-23 LAB — KAPPA/LAMBDA LIGHT CHAINS
Kappa free light chain: 21.3 mg/L — ABNORMAL HIGH (ref 3.3–19.4)
Kappa, lambda light chain ratio: 1.43 (ref 0.26–1.65)
Lambda free light chains: 14.9 mg/L (ref 5.7–26.3)

## 2022-11-23 MED ORDER — FENTANYL 12 MCG/HR TD PT72: 1.0000 | MEDICATED_PATCH | TRANSDERMAL | 0 refills | Status: DC

## 2022-11-25 ENCOUNTER — Other Ambulatory Visit: Payer: Self-pay

## 2022-11-26 ENCOUNTER — Other Ambulatory Visit: Payer: Self-pay

## 2022-11-26 ENCOUNTER — Encounter: Payer: Self-pay | Admitting: Hematology

## 2022-11-26 LAB — MULTIPLE MYELOMA PANEL, SERUM
Albumin SerPl Elph-Mcnc: 3.4 g/dL (ref 2.9–4.4)
Albumin/Glob SerPl: 1.5 (ref 0.7–1.7)
Alpha 1: 0.2 g/dL (ref 0.0–0.4)
Alpha2 Glob SerPl Elph-Mcnc: 0.7 g/dL (ref 0.4–1.0)
B-Globulin SerPl Elph-Mcnc: 0.9 g/dL (ref 0.7–1.3)
Gamma Glob SerPl Elph-Mcnc: 0.6 g/dL (ref 0.4–1.8)
Globulin, Total: 2.3 g/dL (ref 2.2–3.9)
IgA: 143 mg/dL (ref 64–422)
IgG (Immunoglobin G), Serum: 546 mg/dL — ABNORMAL LOW (ref 586–1602)
IgM (Immunoglobulin M), Srm: 24 mg/dL — ABNORMAL LOW (ref 26–217)
Total Protein ELP: 5.7 g/dL — ABNORMAL LOW (ref 6.0–8.5)

## 2022-12-02 ENCOUNTER — Other Ambulatory Visit: Payer: Self-pay | Admitting: Hematology

## 2022-12-02 DIAGNOSIS — C9000 Multiple myeloma not having achieved remission: Secondary | ICD-10-CM

## 2022-12-03 ENCOUNTER — Other Ambulatory Visit: Payer: Self-pay

## 2022-12-03 DIAGNOSIS — C9000 Multiple myeloma not having achieved remission: Secondary | ICD-10-CM

## 2022-12-04 ENCOUNTER — Inpatient Hospital Stay: Payer: Medicare PPO

## 2022-12-04 ENCOUNTER — Other Ambulatory Visit: Payer: Self-pay

## 2022-12-04 VITALS — BP 120/69 | HR 69 | Temp 98.1°F | Resp 18 | Ht 59.0 in | Wt 148.4 lb

## 2022-12-04 DIAGNOSIS — Z7189 Other specified counseling: Secondary | ICD-10-CM

## 2022-12-04 DIAGNOSIS — Z79899 Other long term (current) drug therapy: Secondary | ICD-10-CM | POA: Diagnosis not present

## 2022-12-04 DIAGNOSIS — C9000 Multiple myeloma not having achieved remission: Secondary | ICD-10-CM

## 2022-12-04 DIAGNOSIS — Z5112 Encounter for antineoplastic immunotherapy: Secondary | ICD-10-CM | POA: Diagnosis not present

## 2022-12-04 DIAGNOSIS — Z95828 Presence of other vascular implants and grafts: Secondary | ICD-10-CM

## 2022-12-04 LAB — CBC WITH DIFFERENTIAL (CANCER CENTER ONLY)
Abs Immature Granulocytes: 0.01 10*3/uL (ref 0.00–0.07)
Basophils Absolute: 0 10*3/uL (ref 0.0–0.1)
Basophils Relative: 1 %
Eosinophils Absolute: 0.1 10*3/uL (ref 0.0–0.5)
Eosinophils Relative: 4 %
HCT: 34 % — ABNORMAL LOW (ref 36.0–46.0)
Hemoglobin: 11.6 g/dL — ABNORMAL LOW (ref 12.0–15.0)
Immature Granulocytes: 0 %
Lymphocytes Relative: 30 %
Lymphs Abs: 1 10*3/uL (ref 0.7–4.0)
MCH: 35 pg — ABNORMAL HIGH (ref 26.0–34.0)
MCHC: 34.1 g/dL (ref 30.0–36.0)
MCV: 102.7 fL — ABNORMAL HIGH (ref 80.0–100.0)
Monocytes Absolute: 0.8 10*3/uL (ref 0.1–1.0)
Monocytes Relative: 25 %
Neutro Abs: 1.3 10*3/uL — ABNORMAL LOW (ref 1.7–7.7)
Neutrophils Relative %: 40 %
Platelet Count: 107 10*3/uL — ABNORMAL LOW (ref 150–400)
RBC: 3.31 MIL/uL — ABNORMAL LOW (ref 3.87–5.11)
RDW: 14.6 % (ref 11.5–15.5)
WBC Count: 3.3 10*3/uL — ABNORMAL LOW (ref 4.0–10.5)
nRBC: 0 % (ref 0.0–0.2)

## 2022-12-04 LAB — CMP (CANCER CENTER ONLY)
ALT: 9 U/L (ref 0–44)
AST: 9 U/L — ABNORMAL LOW (ref 15–41)
Albumin: 3.8 g/dL (ref 3.5–5.0)
Alkaline Phosphatase: 101 U/L (ref 38–126)
Anion gap: 6 (ref 5–15)
BUN: 15 mg/dL (ref 8–23)
CO2: 20 mmol/L — ABNORMAL LOW (ref 22–32)
Calcium: 9 mg/dL (ref 8.9–10.3)
Chloride: 111 mmol/L (ref 98–111)
Creatinine: 0.98 mg/dL (ref 0.44–1.00)
GFR, Estimated: 59 mL/min — ABNORMAL LOW (ref >60)
Glucose, Bld: 174 mg/dL — ABNORMAL HIGH (ref 70–99)
Potassium: 3.4 mmol/L — ABNORMAL LOW (ref 3.5–5.1)
Sodium: 137 mmol/L (ref 135–145)
Total Bilirubin: 0.7 mg/dL (ref 0.3–1.2)
Total Protein: 6.1 g/dL — ABNORMAL LOW (ref 6.5–8.1)

## 2022-12-04 MED ORDER — FAMOTIDINE 20 MG PO TABS
20.0000 mg | ORAL_TABLET | Freq: Once | ORAL | Status: AC
  Administered 2022-12-04: 20 mg via ORAL
  Filled 2022-12-04: qty 1

## 2022-12-04 MED ORDER — SODIUM CHLORIDE 0.9% FLUSH
10.0000 mL | Freq: Once | INTRAVENOUS | Status: AC
  Administered 2022-12-04: 10 mL

## 2022-12-04 MED ORDER — DEXTROSE 5 % IV SOLN
36.0000 mg/m2 | Freq: Once | INTRAVENOUS | Status: AC
  Administered 2022-12-04: 60 mg via INTRAVENOUS
  Filled 2022-12-04: qty 30

## 2022-12-04 MED ORDER — SODIUM CHLORIDE 0.9 % IV SOLN: Freq: Once | INTRAVENOUS | Status: AC

## 2022-12-04 MED ORDER — DIPHENHYDRAMINE HCL 25 MG PO CAPS
25.0000 mg | ORAL_CAPSULE | Freq: Once | ORAL | Status: AC
  Administered 2022-12-04: 25 mg via ORAL
  Filled 2022-12-04: qty 1

## 2022-12-04 MED ORDER — ACETAMINOPHEN 325 MG PO TABS
650.0000 mg | ORAL_TABLET | Freq: Once | ORAL | Status: AC
  Administered 2022-12-04: 650 mg via ORAL
  Filled 2022-12-04: qty 2

## 2022-12-04 MED ORDER — ZOLEDRONIC ACID 4 MG/100ML IV SOLN
4.0000 mg | Freq: Once | INTRAVENOUS | Status: AC
  Administered 2022-12-04: 4 mg via INTRAVENOUS
  Filled 2022-12-04: qty 100

## 2022-12-04 MED ORDER — SODIUM CHLORIDE 0.9% FLUSH
10.0000 mL | INTRAVENOUS | Status: DC | PRN
  Administered 2022-12-04: 10 mL

## 2022-12-04 MED ORDER — PROCHLORPERAZINE MALEATE 10 MG PO TABS
10.0000 mg | ORAL_TABLET | Freq: Once | ORAL | Status: AC
  Administered 2022-12-04: 10 mg via ORAL
  Filled 2022-12-04: qty 1

## 2022-12-04 MED ORDER — HEPARIN SOD (PORK) LOCK FLUSH 100 UNIT/ML IV SOLN
500.0000 [IU] | Freq: Once | INTRAVENOUS | Status: AC | PRN
  Administered 2022-12-04: 500 [IU]

## 2022-12-04 MED ORDER — DEXAMETHASONE 4 MG PO TABS
12.0000 mg | ORAL_TABLET | Freq: Once | ORAL | Status: AC
  Administered 2022-12-04: 12 mg via ORAL
  Filled 2022-12-04: qty 3

## 2022-12-04 NOTE — Addendum Note (Signed)
Addended by: Charleston Poot on: 12/04/2022 02:41 PM   Modules accepted: Orders

## 2022-12-04 NOTE — Patient Instructions (Signed)
[facility]  Discharge Instructions: Thank you for choosing [facility] to provide your oncology and hematology care.   If you have a lab appointment with the Cancer Center, please go directly to the Cancer Center and check in at the registration area.   Wear comfortable clothing and clothing appropriate for easy access to any Portacath or PICC line.   We strive to give you quality time with your provider. You may need to reschedule your appointment if you arrive late (15 or more minutes).  Arriving late affects you and other patients whose appointments are after yours.  Also, if you miss three or more appointments without notifying the office, you may be dismissed from the clinic at the provider's discretion.      For prescription refill requests, have your pharmacy contact our office and allow 72 hours for refills to be completed.    Today you received the following chemotherapy and/or immunotherapy agents: Kyprolis.       To help prevent nausea and vomiting after your treatment, we encourage you to take your nausea medication as directed.  BELOW ARE SYMPTOMS THAT SHOULD BE REPORTED IMMEDIATELY: *FEVER GREATER THAN 100.4 F (38 C) OR HIGHER *CHILLS OR SWEATING *NAUSEA AND VOMITING THAT IS NOT CONTROLLED WITH YOUR NAUSEA MEDICATION *UNUSUAL SHORTNESS OF BREATH *UNUSUAL BRUISING OR BLEEDING *URINARY PROBLEMS (pain or burning when urinating, or frequent urination) *BOWEL PROBLEMS (unusual diarrhea, constipation, pain near the anus) TENDERNESS IN MOUTH AND THROAT WITH OR WITHOUT PRESENCE OF ULCERS (sore throat, sores in mouth, or a toothache) UNUSUAL RASH, SWELLING OR PAIN  UNUSUAL VAGINAL DISCHARGE OR ITCHING   Items with * indicate a potential emergency and should be followed up as soon as possible or go to the Emergency Department if any problems should occur.  Please show the CHEMOTHERAPY ALERT CARD or IMMUNOTHERAPY ALERT CARD at check-in to  the Emergency Department and triage nurse.  Should you have questions after your visit or need to cancel or reschedule your appointment, please contact [facility]  Dept: [phone]  and follow the prompts.  Office hours are 8:00 a.m. to 4:30 p.m. Monday - Friday. Please note that voicemails left after 4:00 p.m. may not be returned until the following business day.  We are closed weekends and major holidays. You have access to a nurse at all times for urgent questions. Please call the main number to the clinic Dept: [phone] and follow the prompts.   For any non-urgent questions, you may also contact your provider using MyChart. We now offer e-Visits for anyone 18 and older to request care online for non-urgent symptoms. For details visit [email or web address].   Also download the MyChart app! Go to the app store, search "MyChart", open the app, select [facility], and log in with your MyChart username and password.  Masks are optional in the cancer centers. If you would like for your care team to wear a mask while they are taking care of you, please let them know. You may have one support person who is at least 79 years old accompany you for your appointments. 

## 2022-12-04 NOTE — Progress Notes (Signed)
Ok to treat with ANC 1.3 K/uL per Dr Lorenso Courier

## 2022-12-17 ENCOUNTER — Other Ambulatory Visit: Payer: Self-pay

## 2022-12-17 DIAGNOSIS — C9000 Multiple myeloma not having achieved remission: Secondary | ICD-10-CM

## 2022-12-18 ENCOUNTER — Inpatient Hospital Stay: Payer: Medicare PPO

## 2022-12-18 ENCOUNTER — Inpatient Hospital Stay: Payer: Medicare PPO | Attending: Hematology | Admitting: Hematology

## 2022-12-18 ENCOUNTER — Other Ambulatory Visit: Payer: Self-pay

## 2022-12-18 VITALS — BP 128/67 | HR 73 | Temp 97.7°F | Resp 17 | Wt 151.9 lb

## 2022-12-18 DIAGNOSIS — Z5112 Encounter for antineoplastic immunotherapy: Secondary | ICD-10-CM | POA: Diagnosis not present

## 2022-12-18 DIAGNOSIS — Z5111 Encounter for antineoplastic chemotherapy: Secondary | ICD-10-CM

## 2022-12-18 DIAGNOSIS — Z79899 Other long term (current) drug therapy: Secondary | ICD-10-CM | POA: Insufficient documentation

## 2022-12-18 DIAGNOSIS — Z7189 Other specified counseling: Secondary | ICD-10-CM

## 2022-12-18 DIAGNOSIS — C9000 Multiple myeloma not having achieved remission: Secondary | ICD-10-CM

## 2022-12-18 DIAGNOSIS — Z95828 Presence of other vascular implants and grafts: Secondary | ICD-10-CM

## 2022-12-18 LAB — CMP (CANCER CENTER ONLY)
ALT: 10 U/L (ref 0–44)
AST: 11 U/L — ABNORMAL LOW (ref 15–41)
Albumin: 3.7 g/dL (ref 3.5–5.0)
Alkaline Phosphatase: 102 U/L (ref 38–126)
Anion gap: 8 (ref 5–15)
BUN: 10 mg/dL (ref 8–23)
CO2: 17 mmol/L — ABNORMAL LOW (ref 22–32)
Calcium: 8.6 mg/dL — ABNORMAL LOW (ref 8.9–10.3)
Chloride: 112 mmol/L — ABNORMAL HIGH (ref 98–111)
Creatinine: 1.13 mg/dL — ABNORMAL HIGH (ref 0.44–1.00)
GFR, Estimated: 49 mL/min — ABNORMAL LOW (ref >60)
Glucose, Bld: 217 mg/dL — ABNORMAL HIGH (ref 70–99)
Potassium: 3.5 mmol/L (ref 3.5–5.1)
Sodium: 137 mmol/L (ref 135–145)
Total Bilirubin: 0.5 mg/dL (ref 0.3–1.2)
Total Protein: 5.6 g/dL — ABNORMAL LOW (ref 6.5–8.1)

## 2022-12-18 LAB — CBC WITH DIFFERENTIAL (CANCER CENTER ONLY)
Abs Immature Granulocytes: 0.01 10*3/uL (ref 0.00–0.07)
Basophils Absolute: 0 10*3/uL (ref 0.0–0.1)
Basophils Relative: 1 %
Eosinophils Absolute: 0.1 10*3/uL (ref 0.0–0.5)
Eosinophils Relative: 2 %
HCT: 32.6 % — ABNORMAL LOW (ref 36.0–46.0)
Hemoglobin: 11.1 g/dL — ABNORMAL LOW (ref 12.0–15.0)
Immature Granulocytes: 0 %
Lymphocytes Relative: 24 %
Lymphs Abs: 0.8 10*3/uL (ref 0.7–4.0)
MCH: 35.5 pg — ABNORMAL HIGH (ref 26.0–34.0)
MCHC: 34 g/dL (ref 30.0–36.0)
MCV: 104.2 fL — ABNORMAL HIGH (ref 80.0–100.0)
Monocytes Absolute: 0.5 10*3/uL (ref 0.1–1.0)
Monocytes Relative: 15 %
Neutro Abs: 1.9 10*3/uL (ref 1.7–7.7)
Neutrophils Relative %: 58 %
Platelet Count: 145 10*3/uL — ABNORMAL LOW (ref 150–400)
RBC: 3.13 MIL/uL — ABNORMAL LOW (ref 3.87–5.11)
RDW: 15.2 % (ref 11.5–15.5)
WBC Count: 3.3 10*3/uL — ABNORMAL LOW (ref 4.0–10.5)
nRBC: 0 % (ref 0.0–0.2)

## 2022-12-18 MED ORDER — ACETAMINOPHEN 325 MG PO TABS
650.0000 mg | ORAL_TABLET | Freq: Once | ORAL | Status: AC
  Administered 2022-12-18: 650 mg via ORAL
  Filled 2022-12-18: qty 2

## 2022-12-18 MED ORDER — ALTEPLASE 2 MG IJ SOLR: 2.0000 mg | Freq: Once | INTRAMUSCULAR | Status: DC | PRN

## 2022-12-18 MED ORDER — HEPARIN SOD (PORK) LOCK FLUSH 100 UNIT/ML IV SOLN
500.0000 [IU] | Freq: Once | INTRAVENOUS | Status: AC | PRN
  Administered 2022-12-18: 500 [IU]

## 2022-12-18 MED ORDER — SODIUM CHLORIDE 0.9% FLUSH: 3.0000 mL | INTRAVENOUS | Status: DC | PRN

## 2022-12-18 MED ORDER — SODIUM CHLORIDE 0.9% FLUSH
10.0000 mL | INTRAVENOUS | Status: DC | PRN
  Administered 2022-12-18: 10 mL

## 2022-12-18 MED ORDER — SODIUM CHLORIDE 0.9% FLUSH
10.0000 mL | Freq: Once | INTRAVENOUS | Status: AC
  Administered 2022-12-18: 10 mL

## 2022-12-18 MED ORDER — DEXTROSE 5 % IV SOLN
36.0000 mg/m2 | Freq: Once | INTRAVENOUS | Status: AC
  Administered 2022-12-18: 60 mg via INTRAVENOUS
  Filled 2022-12-18: qty 30

## 2022-12-18 MED ORDER — DIPHENHYDRAMINE HCL 25 MG PO CAPS
25.0000 mg | ORAL_CAPSULE | Freq: Once | ORAL | Status: AC
  Administered 2022-12-18: 25 mg via ORAL
  Filled 2022-12-18: qty 1

## 2022-12-18 MED ORDER — DEXAMETHASONE 4 MG PO TABS
12.0000 mg | ORAL_TABLET | Freq: Once | ORAL | Status: AC
  Administered 2022-12-18: 12 mg via ORAL
  Filled 2022-12-18: qty 3

## 2022-12-18 MED ORDER — FAMOTIDINE 20 MG PO TABS
20.0000 mg | ORAL_TABLET | Freq: Once | ORAL | Status: AC
  Administered 2022-12-18: 20 mg via ORAL
  Filled 2022-12-18: qty 1

## 2022-12-18 MED ORDER — PROCHLORPERAZINE MALEATE 10 MG PO TABS
10.0000 mg | ORAL_TABLET | Freq: Once | ORAL | Status: AC
  Administered 2022-12-18: 10 mg via ORAL
  Filled 2022-12-18: qty 1

## 2022-12-18 MED ORDER — HEPARIN SOD (PORK) LOCK FLUSH 100 UNIT/ML IV SOLN: 250.0000 [IU] | Freq: Once | INTRAVENOUS | Status: DC | PRN

## 2022-12-18 MED ORDER — SODIUM CHLORIDE 0.9 % IV SOLN: Freq: Once | INTRAVENOUS | Status: AC

## 2022-12-18 NOTE — Progress Notes (Signed)
HEMATOLOGY/ONCOLOGY CLINIC NOTE  Date of Service: 12/18/22   Patient Care Team: Hoyt Koch, MD as PCP - General (Internal Medicine) Lafayette Dragon, MD (Inactive) as Consulting Physician (Gastroenterology) Megan Salon, MD as Consulting Physician (Gynecology) Melrose Nakayama, MD as Consulting Physician (Orthopedic Surgery) Deneise Lever, MD as Consulting Physician (Pulmonary Disease) [name], MD (Ophthalmology)  CHIEF COMPLAINTS/PURPOSE OF VISIT:  Follow-up for continued evaluation and management of multiple myeloma  HISTORY OF PRESENTING ILLNESS:  Please see previous notes for details on initial presentation  Current Treatment: Carfilzomib + Revlimid maintenance.  INTERVAL HISTORY:   Faith Orr is a 82 y.o. female who is here for continued evaluation and management of the patient's multiple myeloma.  Patient was last seen by me on 11/20/22 and reported manageable fatigue and controlled chronic back pain. She also reported a light appetite, which she attributed to chronic taste changes.  Today, she notes no acute new symptoms.  Acute new focal bone pains.  No fevers no chills no night sweats no unexpected weight loss.  No new infection issues. No new neuropathy symptoms. She notes that she had a quite a nice Christmas and New Year's.  discussed labs from 12/18/22 with patient.     MEDICAL HISTORY:  Past Medical History:  Diagnosis Date   Allergy    seasonal   Asthma    DEPRESSION    DIABETES MELLITUS, TYPE II    Diverticulosis    HYPERLIPIDEMIA    Macular degeneration of left eye    mild, Dr.Hecker   Obesity, unspecified    Osteoarthritis of both knees    OSTEOPENIA    Osteopenia    URINARY INCONTINENCE     SURGICAL HISTORY: Past Surgical History:  Procedure Laterality Date   CATARACT EXTRACTION Left 05/24/2018   CESAREAN SECTION  01/1973   CYSTOSCOPY/URETEROSCOPY/HOLMIUM LASER/STENT PLACEMENT Right 09/20/2019   Procedure:  CYSTOSCOPY/URETEROSCOPY/HOLMIUM LASER/STENT PLACEMENT;  Surgeon: Lucas Mallow, MD;  Location: [facility];  Service: Urology;  Laterality: Right;   FRACTURE SURGERY     IR IMAGING GUIDED PORT INSERTION  02/20/2019   left wrist surgery  2008   By Dr. Latanya Maudlin   right ankle  1994    SOCIAL HISTORY: Social History   Socioeconomic History   Marital status: Married    Spouse name: Not on file   Number of children: 1   Years of education: Not on file   Highest education level: Not on file  Occupational History    Employer: [employer]  Tobacco Use   Smoking status: Never   Smokeless tobacco: Never   Tobacco comments:    Lives with partner [name]) and son  Vaping Use   Vaping Use: Never used  Substance and Sexual Activity   Alcohol use: No    Alcohol/week: 0.0 standard drinks of alcohol   Drug use: No   Sexual activity: Never    Partners: Female    Birth control/protection: Post-menopausal    Comment: Lives with female partner (annette hicks) and 64 yo son  Other Topics Concern   Not on file  Social History Narrative   Not on file   Social Determinants of Health   Financial Resource Strain: Low Risk  (02/27/2022)   Overall Financial Resource Strain (CARDIA)    Difficulty of Paying Living Expenses: Not hard at all  Food Insecurity: No Food Insecurity (02/27/2022)   Hunger Vital Sign    Worried About Running Out of  Food in the Last Year: Never true    [place] in the Last Year: Never true  Transportation Needs: No Transportation Needs (02/27/2022)   PRAPARE - Hydrologist (Medical): No    Lack of Transportation (Non-Medical): No  Physical Activity: Unknown (02/27/2022)   Exercise Vital Sign    Days of Exercise per Week: Patient refused    Minutes of Exercise per Session: Patient refused  Stress: No Stress Concern Present (02/27/2022)   [place]    Feeling of Stress : Not at all  Social Connections: Unknown (02/27/2022)   Social Connection and Isolation Panel [NHANES]    Frequency of Communication with Friends and Family: Patient refused    Frequency of Social Gatherings with Friends and Family: Patient refused    Attends Religious Services: Patient refused    Active Member of Clubs or Organizations: Patient refused    Attends Archivist Meetings: Patient refused    Marital Status: Living with partner  Intimate Partner Violence: Not At Risk (05/08/2021)   Humiliation, Afraid, Rape, and Kick questionnaire    Fear of Current or Ex-Partner: No    Emotionally Abused: No    Physically Abused: No    Sexually Abused: No    FAMILY HISTORY: Family History  Problem Relation Age of Onset   Diabetes Father    Hyperlipidemia Father    Heart disease Father    Cancer Father    Hypertension Father    Colon cancer Paternal Grandmother 103   Osteoporosis Mother    Protein S deficiency Mother    Hyperlipidemia Mother    Multiple sclerosis Daughter    Cancer Other        bladder   Breast cancer Neg Hx     ALLERGIES:  is allergic to levofloxacin, penicillins, aleve [naproxen sodium], and sulfonamide derivatives.  MEDICATIONS:  Current Outpatient Medications  Medication Sig Dispense Refill   acyclovir (ZOVIRAX) 400 MG tablet Take 1 tablet (400 mg total) by mouth 2 (two) times daily. 60 tablet 5   Blood Glucose Monitoring Suppl (FREESTYLE FREEDOM LITE) W/DEVICE KIT Use to check blood sugars twice a day Dx 250.00 1 each 0   Calcium Carbonate-Vitamin D 600-400 MG-UNIT tablet Take 1 tablet by mouth 2 (two) times daily.     dexamethasone (DECADRON) 4 MG tablet Take 5 tablets (20 mg total) by mouth once a week. On D22 of each cycle of treatment 20 tablet 5   ELIQUIS 2.5 MG TABS tablet TAKE 1 TABLET(2.5 MG) BY MOUTH TWICE DAILY 60 tablet 2   fentaNYL (DURAGESIC) 12 MCG/HR Place 1 patch onto the skin every 3 (three)  days. 10 patch 0   fluticasone (FLONASE) 50 MCG/ACT nasal spray Place 1 spray into both nostrils daily. 16 g 2   glucose blood (FREESTYLE LITE) test strip CHECK BLOOD SUGAR TWICE DAILY AS DIRECTED Dx 250.00 180 each 3   Lancets (FREESTYLE) lancets Use twice daily to check sugars. 100 each 11   lenalidomide (REVLIMID) 5 MG capsule TAKE 1 CAPSULE BY MOUTH DAILY  FOR 21 DAYS, THEN 7 DAYS OFF 21 capsule 0   lidocaine-prilocaine (EMLA) cream APPLY 1 APPLICATION TO THE AFFECTED AREA AS NEEDED. USE PRIOR TO PORT ACCESS 30 g 0   meclizine (ANTIVERT) 25 MG tablet Take 1 tablet (25 mg total) by mouth every 8 (eight) hours as needed for dizziness. 20 tablet 0   metFORMIN (GLUCOPHAGE-XR) 500  MG 24 hr tablet Take 3 tablets (1,500 mg total) by mouth daily. Annual appt due in Jan must see provider for future refills 180 tablet 0   Multiple Vitamins-Minerals (ICAPS) CAPS Take 1 capsule by mouth daily after breakfast.     ondansetron (ZOFRAN) 8 MG tablet Take 1 tablet (8 mg total) by mouth 2 (two) times daily as needed (Nausea or vomiting). 30 tablet 1   Oxycodone HCl 10 MG TABS Take 1 tablet (10 mg total) by mouth every 6 (six) hours as needed. 90 tablet 0   polyethylene glycol (MIRALAX / GLYCOLAX) packet Take 17 g by mouth daily after breakfast.     potassium chloride SA (KLOR-CON M) 20 MEQ tablet TAKE 2 TABLETS BY MOUTH TWICE DAILY 360 tablet 1   prochlorperazine (COMPAZINE) 10 MG tablet Take 1 tablet (10 mg total) by mouth every 6 (six) hours as needed (Nausea or vomiting). 30 tablet 1   senna-docusate (SENNA S) 8.6-50 MG tablet Take 2 tablets by mouth at bedtime. 60 tablet 2   sertraline (ZOLOFT) 100 MG tablet Take 1 tablet (100 mg total) by mouth daily. 90 tablet 3   simvastatin (ZOCOR) 20 MG tablet TAKE 1 TABLET BY MOUTH EVERY DAY AT 6 PM Follow-up appt due must see provider for future refills 90 tablet 0   Vitamin D, Ergocalciferol, (DRISDOL) 1.25 MG (50000 UNIT) CAPS capsule TAKE 1 CAPSULE BY MOUTH EVERY 7  DAYS 12 capsule 0   No current facility-administered medications for this visit.   Facility-Administered Medications Ordered in Other Visits  Medication Dose Route Frequency Provider Last Rate Last Admin   heparin lock flush 100 unit/mL  500 Units Intracatheter Once PRN Brunetta Genera, MD       sodium chloride flush (NS) 0.9 % injection 10 mL  10 mL Intracatheter PRN Brunetta Genera, MD   10 mL at 10/02/19 1524   sodium chloride flush (NS) 0.9 % injection 10 mL  10 mL Intracatheter PRN Brunetta Genera, MD   10 mL at 12/04/22 1517    REVIEW OF SYSTEMS:   .10 Point review of Systems was done is negative except as noted above.  PHYSICAL EXAMINATION: ECOG FS:2 - Symptomatic, <50% confined to bed  Vitals:   12/18/22 1212  BP: 128/67  Pulse: 73  Resp: 17  Temp: 97.7 F (36.5 C)  SpO2: 99%     Wt Readings from Last 3 Encounters:  12/04/22 148 lb 6.4 oz (67.3 kg)  11/20/22 150 lb 11.2 oz (68.4 kg)  11/06/22 148 lb 6.4 oz (67.3 kg)   Body mass index is 30.68 kg/m.   Marland Kitchen GENERAL:alert, in no acute distress and comfortable SKIN: no acute rashes, no significant lesions EYES: conjunctiva are pink and non-injected, sclera anicteric OROPHARYNX: MMM, no exudates, no oropharyngeal erythema or ulceration NECK: supple, no JVD LYMPH:  no palpable lymphadenopathy in the cervical, axillary or inguinal regions LUNGS: clear to auscultation b/l with normal respiratory effort HEART: regular rate & rhythm ABDOMEN:  normoactive bowel sounds , non tender, not distended. Extremity: no pedal edema PSYCH: alert & oriented x 3 with fluent speech NEURO: no focal motor/sensory deficits       Latest Ref Rng & Units 12/18/2022   11:34 AM 12/04/2022   12:52 PM 11/20/2022   10:10 AM  CBC  WBC 4.0 - 10.5 K/uL 3.3  3.3  3.8   Hemoglobin 12.0 - 15.0 g/dL 11.1  11.6  11.5   Hematocrit 36.0 - 46.0 % 32.6  34.0  34.1   Platelets 150 - 400 K/uL 145  107  143       Latest Ref Rng & Units  12/18/2022   11:34 AM 12/04/2022   12:52 PM 11/20/2022   10:10 AM  CMP  Glucose 70 - 99 mg/dL 217  174  160   BUN 8 - 23 mg/dL '10  15  13   '$ Creatinine 0.44 - 1.00 mg/dL 1.13  0.98  0.92   Sodium 135 - 145 mmol/L 137  137  139   Potassium 3.5 - 5.1 mmol/L 3.5  3.4  3.2   Chloride 98 - 111 mmol/L 112  111  112   CO2 22 - 32 mmol/L '17  20  20   '$ Calcium 8.9 - 10.3 mg/dL 8.6  9.0  9.3   Total Protein 6.5 - 8.1 g/dL 5.6  6.1  6.0   Total Bilirubin 0.3 - 1.2 mg/dL 0.5  0.7  0.6   Alkaline Phos 38 - 126 U/L 102  101  100   AST 15 - 41 U/L '11  9  11   '$ ALT 0 - 44 U/L '10  9  9      '$ 09/18/2019 BM Bx Report ([id number])   09/18/2019 FISH Panel    05/30/2019 BM Bx   01/06/2019 BM Bx:     01/06/19 Cytogenetics:      05/30/19 BM Biopsy:   09/18/2019 FISH Panel    09/18/2019 BM Surgical Pathology ([id number])     RADIOGRAPHIC STUDIES: I have personally reviewed the radiological images as listed and agreed with the findings in the report. No results found.   ASSESSMENT & PLAN:   82 y.o. female with  1.  Nonsecretory multiple Myeloma, RISS Stage III  Labs upon initial presentation from 12/08/18, blood counts are normal including WBC at 7.1k, HGB at 13.1, and PLT at 245k. Calcium normal at 10.3. Creatinine normal at 0.63. M spike at 0.5g. 12/13/18 Bone Scan revealed Multifocal uptake throughout the skeleton, consistent with diffuse metastatic disease. Primary tumor is not specified. 2. Uptake in the proximal right femur, consistent with lytic lesions. 3. Uptake in the ribs bilaterally as described. 4. Lesions in the proximal left humerus. 5. Diffuse uptake throughout the skull consistent with metastatic disease. 6. Right paramedian uptake at the manubrium.  12/13/18 CT Right Femur revealed Numerous lytic lesions involving the right femur and a lytic lesion in the left inferior pubic ramus. Overall appearance is most concerning for multiple myeloma  12/27/18  Pretreatment 24hour UPEP observed an M spike at '18mg'$ , and showed '199mg'$  total protein/day.  12/27/18 Pretreatment MMP revealed M Protein at 0.5g with IgG Lambda specificity. Kappa:Lambda light chain ratio at 0.13, with Lambda at 40.3. There is less abnormal protein and light chains than I would expect from 30% plasma cells, which suggests hypo-secretory or non-secretory neoplastic plasma cells. Will have an impact in assessing response. 01/05/19 PET/CT revealed Innumerable lytic lesions in the skeleton compatible with myeloma. Most of the larger lesions are hypermetabolic, for example including a left proximal humeral shaft lesion with maximum SUV of 8.1 and a 2.8 cm lesion in the left T9 vertebral body with maximum SUV 5.1. Most of the smaller lytic lesions, and some of the larger lesions, do not demonstrate accentuated metabolic activity. 2. 1.2 cm in short axis lymph node in the left parapharyngeal space is hypermetabolic with maximum SUV 11.8. I do not see a separate mass in the head and neck to give rise to  this hypermetabolic lymph node. 3. Mosaic attenuation in the lower lobes, nonspecific possibly from air trapping. 4.  Aortic Atherosclerosis 5. Heterogeneous activity in the liver, making it hard to exclude small liver lesions. Consider hepatic protocol MRI with and without contrast for definitive assessment. Nonobstructive right nephrolithiasis. Old granulomatous disease  01/06/19 Bone Marrow biopsy revealed interstitial increase in plasma cells (28% aspirate, 40% CD138 immunohistochemistry). Plasma cells negative for light chains consistent with a non or weakly secretory myeloma   01/06/19 Cytogenetics revealed 37% of cells with trisomy 11 or 11q deletion, and 40.5% of cells with 17p mutation  S/p 5 cycles of KRD treatment  05/31/19 BM Biopsy revealed mild atypical plasmacytosis at 5% with polytypic variation.   06/01/19 PET/CT revealed "Dominant lesion in the LEFT humerus is decreased significantly  in metabolic activity. Additional hypermetabolic skeletal lytic lesions have decreased in metabolic activity or similar to comparison exam (01/05/2019). No evidence of disease progression. 2. Multiple additional lytic lesions do not have metabolic activity and unchanged. 3. No new skeletal lesions are identified. No soft tissue plasmacytoma identified. 4. Nodule / node in the LEFT parapharyngeal space which is intensely hypermetabolic not changed from prior. 5. New hypermetabolic LEFT lower lobe pulmonary nodule is indeterminate. Recommend close attention on follow-up 6. New obstructive hydronephrosis of the RIGHT kidney related to RIGHT UPJ stone."  09/18/2019 BM Bx Report which revealed "Slightly hypercellular bone marrow for age with trilineage hematopoiesis and 1% plasma cells."  09/14/2019 PET/CT Whole Body Scan ([id number]) which revealed "1. There widespread tiny lytic lesions compatible with multiple myeloma. Index larger lesions are generally similar to the prior exam, with low-grade activity such as the left T9 vertebral body lesion with maximum SUV 4.5. Is mild increase in the activity associated with a mildly sclerotic left proximal humeral lesion, maximum SUV 4.8 (previously 3.5). 2. At the site of the prior left lower lobe nodule is currently more bandlike thickening, with maximum SUV only 1.9, probably benign, continued surveillance of this region suggested. 3. There several small but hypermetabolic lymph nodes. This includes a left parapharyngeal space node measuring 1.0 cm with maximum SUV 12.3 (stable); a left level IB lymph node measuring 0.5 cm with maximum SUV 4.8 (slightly larger than prior); and a left inguinal lymph node measuring 0.7 cm in short axis with maximum SUV 6.4 (previously 0.5 cm with maximum SUV 0.6). Significance of these lymph nodes uncertain, surveillance is recommended. 4. New 5 mm left lower lobe subpleural nodule on image 32/8, not appreciably hypermetabolic,  surveillance suggested. 5. Focal subcutaneous stranding along the left perineum measuring about 2.6 by 1.1 cm on image 221/4, maximum SUV 12.5. This was not present previously and is most likely inflammatory, although given the notable SUV, surveillance of this region is suggested. 6. Other imaging findings of potential clinical significance: Aortic Atherosclerosis (ICD10-I70.0). Coronary atherosclerosis. Old granulomatous disease. Mild right hydronephrosis due to a 7 mm right UPJ calculus. 2 mm right kidney upper pole nonobstructive renal calculus. Prominent stool throughout the colon favors constipation."  12/19/2019 Thoracic & Lumbar Spine MRI ([id number]) ([id number]) revealed "Suspected myeloma lesions at T9 and S1. No compression deformity or epidural disease.  01/18/2020 PET/CT ([id number]) which revealed "1. Stable lytic lesions throughout the skeleton. The larger lytic lesions which had mild metabolic activity on comparison exam now have background metabolic activity. No evidence of active myeloma. No evidence of progression multiple myeloma.  No plasmacytoma 3. Hypermetabolic nodules in the LEFT neck may be associated deep tissues of the LEFT parotid  gland. 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  01/17/19 MRI Liver revealed Several appreciable liver lesions all have benign imaging characteristics. No MRI findings of metastatic involvement of the liver. 2. Scattered bony lesions corresponding to the lytic lesions seen at PET-CT, compatible with active myeloma. 3. Aortic Atherosclerosis.  Mild cardiomegaly. 4. Diffuse hepatic steatosis.   3. Left lower lobe pulmonary nodule First seen on 06/01/19 PET/CT PET/CT 08/06/2352: No hypermetabolic mediastinal or hilar nodes. No suspicious pulmonary nodules on  the CT scan.  4. 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 Being managed conservatively as per patient's preference.  No symptoms from this currently.  5. Mild chronic kidney disease CMP done 04/09/2022 showed creatinine of 1.17 and GFR est of 48 consistent with mild chronic kidney disease.  PLAN: -Labs done today on 12/18/2022 were discussed with the patient in detail CBC shows mild anemia with a hemoglobin of 11.1 and mild leukopenia with a WBC count 3.3k and platelets of 145k -CMP is stable. -Multiple myeloma labs and light chains show no overt evidence of disease progression however the patient has nonsecretory myeloma. -Continue maintenance Revlimid at 5 mg p.o. daily 3 weeks on 1 week off -Continue carfilzomib 36 mg/m every 2 weeks for maintenance with the same supportive medications. -Continue Eliquis 2.5 mg p.o. twice daily for VTE prophylaxis -Continue Zometa every 4 weeks for bone directed therapy. -We discussed getting bone marrow biopsy and repeat PET CT scan to monitor her disease status given she has nonsecretory myeloma. -Treatment orders were reviewed and placed - FOLLOW UP: CT guided Bone marrow aspiration and biopsy in 2-3 weeks PET/CT in 2-3 weeks Follow-up as per integrated scheduling MD visit in 4 weeks   The total time spent in the appointment was 31 minutes* .  All of the patient's questions were answered with apparent satisfaction. The patient knows to call the clinic with any problems, questions or concerns.   Sullivan Lone MD MS AAHIVMS [facility] Hematology/Oncology Physician [facility]  .*Total Encounter Time as defined by the Centers for Medicare and Medicaid Services includes, in addition to the face-to-face time of a patient visit (documented in the note above) non-face-to-face time: obtaining and reviewing outside history, ordering and reviewing  medications, tests or procedures, care coordination (communications with other health care professionals or caregivers) and documentation in the medical record.   I,Mitra Faeizi,acting as a Education administrator for Sullivan Lone, MD.,have documented all relevant documentation on the behalf of Sullivan Lone, MD,as directed by  Sullivan Lone, MD while in the presence of Sullivan Lone, MD.  .I have reviewed the above documentation for accuracy and completeness, and I agree with the above. Brunetta Genera MD

## 2022-12-18 NOTE — Patient Instructions (Signed)
[facility] MEDICAL ONCOLOGY  Discharge Instructions: Thank you for choosing [facility] to provide your oncology and hematology care.   If you have a lab appointment with the Cancer Center, please go directly to the Cancer Center and check in at the registration area.   Wear comfortable clothing and clothing appropriate for easy access to any Portacath or PICC line.   We strive to give you quality time with your provider. You may need to reschedule your appointment if you arrive late (15 or more minutes).  Arriving late affects you and other patients whose appointments are after yours.  Also, if you miss three or more appointments without notifying the office, you may be dismissed from the clinic at the provider's discretion.      For prescription refill requests, have your pharmacy contact our office and allow 72 hours for refills to be completed.    Today you received the following chemotherapy and/or immunotherapy agents: Kyprolis.       To help prevent nausea and vomiting after your treatment, we encourage you to take your nausea medication as directed.  BELOW ARE SYMPTOMS THAT SHOULD BE REPORTED IMMEDIATELY: *FEVER GREATER THAN 100.4 F (38 C) OR HIGHER *CHILLS OR SWEATING *NAUSEA AND VOMITING THAT IS NOT CONTROLLED WITH YOUR NAUSEA MEDICATION *UNUSUAL SHORTNESS OF BREATH *UNUSUAL BRUISING OR BLEEDING *URINARY PROBLEMS (pain or burning when urinating, or frequent urination) *BOWEL PROBLEMS (unusual diarrhea, constipation, pain near the anus) TENDERNESS IN MOUTH AND THROAT WITH OR WITHOUT PRESENCE OF ULCERS (sore throat, sores in mouth, or a toothache) UNUSUAL RASH, SWELLING OR PAIN  UNUSUAL VAGINAL DISCHARGE OR ITCHING   Items with * indicate a potential emergency and should be followed up as soon as possible or go to the Emergency Department if any problems should occur.  Please show the CHEMOTHERAPY ALERT CARD or IMMUNOTHERAPY ALERT CARD at check-in to  the Emergency Department and triage nurse.  Should you have questions after your visit or need to cancel or reschedule your appointment, please contact [facility] MEDICAL ONCOLOGY  Dept: [phone]  and follow the prompts.  Office hours are 8:00 a.m. to 4:30 p.m. Monday - Friday. Please note that voicemails left after 4:00 p.m. may not be returned until the following business day.  We are closed weekends and major holidays. You have access to a nurse at all times for urgent questions. Please call the main number to the clinic Dept: [phone] and follow the prompts.   For any non-urgent questions, you may also contact your provider using MyChart. We now offer e-Visits for anyone 18 and older to request care online for non-urgent symptoms. For details visit [email or web address].   Also download the MyChart app! Go to the app store, search "MyChart", open the app, select [facility], and log in with your MyChart username and password.  Masks are optional in the cancer centers. If you would like for your care team to wear a mask while they are taking care of you, please let them know. You may have one support Latrell Reitan who is at least 80 years old accompany you for your appointments. 

## 2022-12-21 LAB — KAPPA/LAMBDA LIGHT CHAINS
Kappa free light chain: 23.5 mg/L — ABNORMAL HIGH (ref 3.3–19.4)
Kappa, lambda light chain ratio: 1.37 (ref 0.26–1.65)
Lambda free light chains: 17.1 mg/L (ref 5.7–26.3)

## 2022-12-22 ENCOUNTER — Other Ambulatory Visit: Payer: Self-pay

## 2022-12-22 ENCOUNTER — Encounter: Payer: Self-pay | Admitting: Hematology

## 2022-12-22 DIAGNOSIS — C9000 Multiple myeloma not having achieved remission: Secondary | ICD-10-CM

## 2022-12-22 MED ORDER — OXYCODONE HCL 10 MG PO TABS: 10.0000 mg | ORAL_TABLET | Freq: Four times a day (QID) | ORAL | 0 refills | Status: DC | PRN

## 2022-12-22 MED ORDER — PANTOPRAZOLE SODIUM 20 MG PO TBEC: 20.0000 mg | DELAYED_RELEASE_TABLET | Freq: Every day | ORAL | 2 refills | Status: DC

## 2022-12-23 LAB — MULTIPLE MYELOMA PANEL, SERUM
Albumin SerPl Elph-Mcnc: 3.4 g/dL (ref 2.9–4.4)
Albumin/Glob SerPl: 1.5 (ref 0.7–1.7)
Alpha 1: 0.3 g/dL (ref 0.0–0.4)
Alpha2 Glob SerPl Elph-Mcnc: 0.7 g/dL (ref 0.4–1.0)
B-Globulin SerPl Elph-Mcnc: 0.8 g/dL (ref 0.7–1.3)
Gamma Glob SerPl Elph-Mcnc: 0.6 g/dL (ref 0.4–1.8)
Globulin, Total: 2.4 g/dL (ref 2.2–3.9)
IgA: 150 mg/dL (ref 64–422)
IgG (Immunoglobin G), Serum: 590 mg/dL (ref 586–1602)
IgM (Immunoglobulin M), Srm: 14 mg/dL — ABNORMAL LOW (ref 26–217)
Total Protein ELP: 5.8 g/dL — ABNORMAL LOW (ref 6.0–8.5)

## 2022-12-24 ENCOUNTER — Encounter: Payer: Self-pay | Admitting: Hematology

## 2022-12-28 ENCOUNTER — Other Ambulatory Visit: Payer: Self-pay | Admitting: Hematology

## 2022-12-28 DIAGNOSIS — C9000 Multiple myeloma not having achieved remission: Secondary | ICD-10-CM

## 2022-12-30 ENCOUNTER — Other Ambulatory Visit: Payer: Self-pay

## 2022-12-30 DIAGNOSIS — C9000 Multiple myeloma not having achieved remission: Secondary | ICD-10-CM

## 2022-12-30 MED ORDER — LENALIDOMIDE 5 MG PO CAPS: ORAL_CAPSULE | ORAL | 0 refills | Status: DC

## 2022-12-31 ENCOUNTER — Other Ambulatory Visit: Payer: Self-pay

## 2022-12-31 DIAGNOSIS — C9000 Multiple myeloma not having achieved remission: Secondary | ICD-10-CM

## 2023-01-01 ENCOUNTER — Inpatient Hospital Stay: Payer: Medicare PPO

## 2023-01-01 ENCOUNTER — Other Ambulatory Visit: Payer: Self-pay

## 2023-01-01 VITALS — BP 130/74 | HR 80 | Temp 98.9°F | Resp 18 | Wt 150.8 lb

## 2023-01-01 DIAGNOSIS — C9000 Multiple myeloma not having achieved remission: Secondary | ICD-10-CM

## 2023-01-01 DIAGNOSIS — Z95828 Presence of other vascular implants and grafts: Secondary | ICD-10-CM

## 2023-01-01 DIAGNOSIS — Z5112 Encounter for antineoplastic immunotherapy: Secondary | ICD-10-CM | POA: Diagnosis not present

## 2023-01-01 DIAGNOSIS — Z7189 Other specified counseling: Secondary | ICD-10-CM

## 2023-01-01 DIAGNOSIS — Z79899 Other long term (current) drug therapy: Secondary | ICD-10-CM | POA: Diagnosis not present

## 2023-01-01 LAB — CMP (CANCER CENTER ONLY)
ALT: 10 U/L (ref 0–44)
AST: 9 U/L — ABNORMAL LOW (ref 15–41)
Albumin: 3.7 g/dL (ref 3.5–5.0)
Alkaline Phosphatase: 102 U/L (ref 38–126)
Anion gap: 8 (ref 5–15)
BUN: 8 mg/dL (ref 8–23)
CO2: 18 mmol/L — ABNORMAL LOW (ref 22–32)
Calcium: 8.5 mg/dL — ABNORMAL LOW (ref 8.9–10.3)
Chloride: 111 mmol/L (ref 98–111)
Creatinine: 1.03 mg/dL — ABNORMAL HIGH (ref 0.44–1.00)
GFR, Estimated: 55 mL/min — ABNORMAL LOW (ref >60)
Glucose, Bld: 200 mg/dL — ABNORMAL HIGH (ref 70–99)
Potassium: 3.5 mmol/L (ref 3.5–5.1)
Sodium: 137 mmol/L (ref 135–145)
Total Bilirubin: 0.7 mg/dL (ref 0.3–1.2)
Total Protein: 5.8 g/dL — ABNORMAL LOW (ref 6.5–8.1)

## 2023-01-01 LAB — CBC WITH DIFFERENTIAL (CANCER CENTER ONLY)
Abs Immature Granulocytes: 0.02 10*3/uL (ref 0.00–0.07)
Basophils Absolute: 0 10*3/uL (ref 0.0–0.1)
Basophils Relative: 1 %
Eosinophils Absolute: 0.1 10*3/uL (ref 0.0–0.5)
Eosinophils Relative: 3 %
HCT: 34.4 % — ABNORMAL LOW (ref 36.0–46.0)
Hemoglobin: 11.7 g/dL — ABNORMAL LOW (ref 12.0–15.0)
Immature Granulocytes: 1 %
Lymphocytes Relative: 23 %
Lymphs Abs: 0.8 10*3/uL (ref 0.7–4.0)
MCH: 35 pg — ABNORMAL HIGH (ref 26.0–34.0)
MCHC: 34 g/dL (ref 30.0–36.0)
MCV: 103 fL — ABNORMAL HIGH (ref 80.0–100.0)
Monocytes Absolute: 0.8 10*3/uL (ref 0.1–1.0)
Monocytes Relative: 22 %
Neutro Abs: 1.8 10*3/uL (ref 1.7–7.7)
Neutrophils Relative %: 50 %
Platelet Count: 99 10*3/uL — ABNORMAL LOW (ref 150–400)
RBC: 3.34 MIL/uL — ABNORMAL LOW (ref 3.87–5.11)
RDW: 14.8 % (ref 11.5–15.5)
WBC Count: 3.6 10*3/uL — ABNORMAL LOW (ref 4.0–10.5)
nRBC: 0 % (ref 0.0–0.2)

## 2023-01-01 MED ORDER — FAMOTIDINE 20 MG PO TABS
20.0000 mg | ORAL_TABLET | Freq: Once | ORAL | Status: AC
  Administered 2023-01-01: 20 mg via ORAL
  Filled 2023-01-01: qty 1

## 2023-01-01 MED ORDER — DEXTROSE 5 % IV SOLN
36.0000 mg/m2 | Freq: Once | INTRAVENOUS | Status: AC
  Administered 2023-01-01: 60 mg via INTRAVENOUS
  Filled 2023-01-01: qty 30

## 2023-01-01 MED ORDER — SODIUM CHLORIDE 0.9 % IV SOLN: Freq: Once | INTRAVENOUS | Status: AC

## 2023-01-01 MED ORDER — DEXAMETHASONE 4 MG PO TABS
12.0000 mg | ORAL_TABLET | Freq: Once | ORAL | Status: AC
  Administered 2023-01-01: 12 mg via ORAL
  Filled 2023-01-01: qty 3

## 2023-01-01 MED ORDER — SODIUM CHLORIDE 0.9% FLUSH
10.0000 mL | INTRAVENOUS | Status: DC | PRN
  Administered 2023-01-01: 10 mL

## 2023-01-01 MED ORDER — PROCHLORPERAZINE MALEATE 10 MG PO TABS
10.0000 mg | ORAL_TABLET | Freq: Once | ORAL | Status: AC
  Administered 2023-01-01: 10 mg via ORAL
  Filled 2023-01-01: qty 1

## 2023-01-01 MED ORDER — HEPARIN SOD (PORK) LOCK FLUSH 100 UNIT/ML IV SOLN
500.0000 [IU] | Freq: Once | INTRAVENOUS | Status: AC | PRN
  Administered 2023-01-01: 500 [IU]

## 2023-01-01 MED ORDER — ZOLEDRONIC ACID 4 MG/100ML IV SOLN
4.0000 mg | Freq: Once | INTRAVENOUS | Status: AC
  Administered 2023-01-01: 4 mg via INTRAVENOUS
  Filled 2023-01-01: qty 100

## 2023-01-01 MED ORDER — DIPHENHYDRAMINE HCL 25 MG PO CAPS
25.0000 mg | ORAL_CAPSULE | Freq: Once | ORAL | Status: AC
  Administered 2023-01-01: 25 mg via ORAL
  Filled 2023-01-01: qty 1

## 2023-01-01 MED ORDER — ACETAMINOPHEN 325 MG PO TABS
650.0000 mg | ORAL_TABLET | Freq: Once | ORAL | Status: AC
  Administered 2023-01-01: 650 mg via ORAL
  Filled 2023-01-01: qty 2

## 2023-01-01 MED ORDER — SODIUM CHLORIDE 0.9% FLUSH
10.0000 mL | Freq: Once | INTRAVENOUS | Status: AC
  Administered 2023-01-01: 10 mL

## 2023-01-01 NOTE — Patient Instructions (Addendum)
[facility]  Discharge Instructions: Thank you for choosing [facility] to provide your oncology and hematology care.   If you have a lab appointment with the Cancer Center, please go directly to the Cancer Center and check in at the registration area.   Wear comfortable clothing and clothing appropriate for easy access to any Portacath or PICC line.   We strive to give you quality time with your provider. You may need to reschedule your appointment if you arrive late (15 or more minutes).  Arriving late affects you and other patients whose appointments are after yours.  Also, if you miss three or more appointments without notifying the office, you may be dismissed from the clinic at the provider's discretion.      For prescription refill requests, have your pharmacy contact our office and allow 72 hours for refills to be completed.    Today you received the following chemotherapy and/or immunotherapy agents Kyprolis & Zometa     To help prevent nausea and vomiting after your treatment, we encourage you to take your nausea medication as directed.  BELOW ARE SYMPTOMS THAT SHOULD BE REPORTED IMMEDIATELY: *FEVER GREATER THAN 100.4 F (38 C) OR HIGHER *CHILLS OR SWEATING *NAUSEA AND VOMITING THAT IS NOT CONTROLLED WITH YOUR NAUSEA MEDICATION *UNUSUAL SHORTNESS OF BREATH *UNUSUAL BRUISING OR BLEEDING *URINARY PROBLEMS (pain or burning when urinating, or frequent urination) *BOWEL PROBLEMS (unusual diarrhea, constipation, pain near the anus) TENDERNESS IN MOUTH AND THROAT WITH OR WITHOUT PRESENCE OF ULCERS (sore throat, sores in mouth, or a toothache) UNUSUAL RASH, SWELLING OR PAIN  UNUSUAL VAGINAL DISCHARGE OR ITCHING   Items with * indicate a potential emergency and should be followed up as soon as possible or go to the Emergency Department if any problems should occur.  Please show the CHEMOTHERAPY ALERT CARD or IMMUNOTHERAPY ALERT CARD at  check-in to the Emergency Department and triage nurse.  Should you have questions after your visit or need to cancel or reschedule your appointment, please contact [facility]  Dept: [phone]  and follow the prompts.  Office hours are 8:00 a.m. to 4:30 p.m. Monday - Friday. Please note that voicemails left after 4:00 p.m. may not be returned until the following business day.  We are closed weekends and major holidays. You have access to a nurse at all times for urgent questions. Please call the main number to the clinic Dept: [phone] and follow the prompts.   For any non-urgent questions, you may also contact your provider using MyChart. We now offer e-Visits for anyone 18 and older to request care online for non-urgent symptoms. For details visit [email or web address].   Also download the MyChart app! Go to the app store, search "MyChart", open the app, select [facility], and log in with your MyChart username and password.  

## 2023-01-01 NOTE — Progress Notes (Signed)
Per MD Irene Limbo, ok to proceed with platelets of 99 K/uL today. Per MD Irene Limbo, ok to proceed with Zometa today with Calcium 8.5.

## 2023-01-13 ENCOUNTER — Other Ambulatory Visit: Payer: Self-pay

## 2023-01-13 DIAGNOSIS — C9000 Multiple myeloma not having achieved remission: Secondary | ICD-10-CM

## 2023-01-15 ENCOUNTER — Inpatient Hospital Stay: Payer: Medicare PPO | Attending: Hematology

## 2023-01-15 ENCOUNTER — Other Ambulatory Visit: Payer: Self-pay

## 2023-01-15 ENCOUNTER — Inpatient Hospital Stay: Payer: Medicare PPO | Admitting: Hematology

## 2023-01-15 ENCOUNTER — Inpatient Hospital Stay: Payer: Medicare PPO

## 2023-01-15 VITALS — BP 135/58 | HR 65 | Temp 97.9°F | Resp 18 | Wt 149.5 lb

## 2023-01-15 DIAGNOSIS — Z95828 Presence of other vascular implants and grafts: Secondary | ICD-10-CM

## 2023-01-15 DIAGNOSIS — Z7189 Other specified counseling: Secondary | ICD-10-CM

## 2023-01-15 DIAGNOSIS — Z5112 Encounter for antineoplastic immunotherapy: Secondary | ICD-10-CM | POA: Insufficient documentation

## 2023-01-15 DIAGNOSIS — C9000 Multiple myeloma not having achieved remission: Secondary | ICD-10-CM | POA: Diagnosis not present

## 2023-01-15 DIAGNOSIS — Z79899 Other long term (current) drug therapy: Secondary | ICD-10-CM | POA: Insufficient documentation

## 2023-01-15 LAB — CMP (CANCER CENTER ONLY)
ALT: 10 U/L (ref 0–44)
AST: 11 U/L — ABNORMAL LOW (ref 15–41)
Albumin: 3.7 g/dL (ref 3.5–5.0)
Alkaline Phosphatase: 101 U/L (ref 38–126)
Anion gap: 8 (ref 5–15)
BUN: 7 mg/dL — ABNORMAL LOW (ref 8–23)
CO2: 18 mmol/L — ABNORMAL LOW (ref 22–32)
Calcium: 9.1 mg/dL (ref 8.9–10.3)
Chloride: 111 mmol/L (ref 98–111)
Creatinine: 0.94 mg/dL (ref 0.44–1.00)
GFR, Estimated: >60 mL/min (ref >60)
Glucose, Bld: 180 mg/dL — ABNORMAL HIGH (ref 70–99)
Potassium: 3.7 mmol/L (ref 3.5–5.1)
Sodium: 137 mmol/L (ref 135–145)
Total Bilirubin: 0.6 mg/dL (ref 0.3–1.2)
Total Protein: 6 g/dL — ABNORMAL LOW (ref 6.5–8.1)

## 2023-01-15 LAB — CBC WITH DIFFERENTIAL (CANCER CENTER ONLY)
Abs Immature Granulocytes: 0.02 10*3/uL (ref 0.00–0.07)
Basophils Absolute: 0.1 10*3/uL (ref 0.0–0.1)
Basophils Relative: 2 %
Eosinophils Absolute: 0.1 10*3/uL (ref 0.0–0.5)
Eosinophils Relative: 2 %
HCT: 34.1 % — ABNORMAL LOW (ref 36.0–46.0)
Hemoglobin: 11.8 g/dL — ABNORMAL LOW (ref 12.0–15.0)
Immature Granulocytes: 1 %
Lymphocytes Relative: 23 %
Lymphs Abs: 0.9 10*3/uL (ref 0.7–4.0)
MCH: 35.8 pg — ABNORMAL HIGH (ref 26.0–34.0)
MCHC: 34.6 g/dL (ref 30.0–36.0)
MCV: 103.3 fL — ABNORMAL HIGH (ref 80.0–100.0)
Monocytes Absolute: 0.5 10*3/uL (ref 0.1–1.0)
Monocytes Relative: 13 %
Neutro Abs: 2.4 10*3/uL (ref 1.7–7.7)
Neutrophils Relative %: 59 %
Platelet Count: 155 10*3/uL (ref 150–400)
RBC: 3.3 MIL/uL — ABNORMAL LOW (ref 3.87–5.11)
RDW: 14.9 % (ref 11.5–15.5)
WBC Count: 3.9 10*3/uL — ABNORMAL LOW (ref 4.0–10.5)
nRBC: 0 % (ref 0.0–0.2)

## 2023-01-15 MED ORDER — SODIUM CHLORIDE 0.9% FLUSH
10.0000 mL | Freq: Once | INTRAVENOUS | Status: AC
  Administered 2023-01-15: 10 mL

## 2023-01-15 MED ORDER — SODIUM CHLORIDE 0.9 % IV SOLN: Freq: Once | INTRAVENOUS | Status: AC

## 2023-01-15 MED ORDER — HEPARIN SOD (PORK) LOCK FLUSH 100 UNIT/ML IV SOLN
500.0000 [IU] | Freq: Once | INTRAVENOUS | Status: AC | PRN
  Administered 2023-01-15: 500 [IU]

## 2023-01-15 MED ORDER — SODIUM CHLORIDE 0.9% FLUSH
10.0000 mL | INTRAVENOUS | Status: DC | PRN
  Administered 2023-01-15: 10 mL

## 2023-01-15 MED ORDER — DEXTROSE 5 % IV SOLN
36.0000 mg/m2 | Freq: Once | INTRAVENOUS | Status: AC
  Administered 2023-01-15: 60 mg via INTRAVENOUS
  Filled 2023-01-15: qty 30

## 2023-01-15 MED ORDER — FAMOTIDINE 20 MG PO TABS
20.0000 mg | ORAL_TABLET | Freq: Once | ORAL | Status: AC
  Administered 2023-01-15: 20 mg via ORAL
  Filled 2023-01-15: qty 1

## 2023-01-15 MED ORDER — DIPHENHYDRAMINE HCL 25 MG PO CAPS
25.0000 mg | ORAL_CAPSULE | Freq: Once | ORAL | Status: AC
  Administered 2023-01-15: 25 mg via ORAL
  Filled 2023-01-15: qty 1

## 2023-01-15 MED ORDER — PROCHLORPERAZINE MALEATE 10 MG PO TABS
10.0000 mg | ORAL_TABLET | Freq: Once | ORAL | Status: AC
  Administered 2023-01-15: 10 mg via ORAL
  Filled 2023-01-15: qty 1

## 2023-01-15 MED ORDER — DEXAMETHASONE 4 MG PO TABS
12.0000 mg | ORAL_TABLET | Freq: Once | ORAL | Status: AC
  Administered 2023-01-15: 12 mg via ORAL
  Filled 2023-01-15: qty 3

## 2023-01-15 MED ORDER — ACETAMINOPHEN 325 MG PO TABS
650.0000 mg | ORAL_TABLET | Freq: Once | ORAL | Status: AC
  Administered 2023-01-15: 650 mg via ORAL
  Filled 2023-01-15: qty 2

## 2023-01-15 NOTE — Patient Instructions (Signed)
[facility]  Discharge Instructions: Thank you for choosing [facility] to provide your oncology and hematology care.   If you have a lab appointment with the [facility], please go directly to the [facility] and check in at the registration area.   Wear comfortable clothing and clothing appropriate for easy access to any Portacath or PICC line.   We strive to give you quality time with your provider. You may need to reschedule your appointment if you arrive late (15 or more minutes).  Arriving late affects you and other patients whose appointments are after yours.  Also, if you miss three or more appointments without notifying the office, you may be dismissed from the clinic at the provider's discretion.      For prescription refill requests, have your pharmacy contact our office and allow 72 hours for refills to be completed.    Today you received the following chemotherapy and/or immunotherapy agents Carfilzomib Linard Millers).      To help prevent nausea and vomiting after your treatment, we encourage you to take your nausea medication as directed.  BELOW ARE SYMPTOMS THAT SHOULD BE REPORTED IMMEDIATELY: *FEVER GREATER THAN 100.4 F (38 C) OR HIGHER *CHILLS OR SWEATING *NAUSEA AND VOMITING THAT IS NOT CONTROLLED WITH YOUR NAUSEA MEDICATION *UNUSUAL SHORTNESS OF BREATH *UNUSUAL BRUISING OR BLEEDING *URINARY PROBLEMS (pain or burning when urinating, or frequent urination) *BOWEL PROBLEMS (unusual diarrhea, constipation, pain near the anus) TENDERNESS IN MOUTH AND THROAT WITH OR WITHOUT PRESENCE OF ULCERS (sore throat, sores in mouth, or a toothache) UNUSUAL RASH, SWELLING OR PAIN  UNUSUAL VAGINAL DISCHARGE OR ITCHING   Items with * indicate a potential emergency and should be followed up as soon as possible or go to the Emergency Department if any problems should occur.  Please show the CHEMOTHERAPY ALERT CARD or IMMUNOTHERAPY ALERT  CARD at check-in to the Emergency Department and triage nurse.  Should you have questions after your visit or need to cancel or reschedule your appointment, please contact [facility]  Dept: [phone]  and follow the prompts.  Office hours are 8:00 a.m. to 4:30 p.m. Monday - Friday. Please note that voicemails left after 4:00 p.m. may not be returned until the following business day.  We are closed weekends and major holidays. You have access to a nurse at all times for urgent questions. Please call the main number to the clinic Dept: [phone] and follow the prompts.   For any non-urgent questions, you may also contact your provider using MyChart. We now offer e-Visits for anyone 57 and older to request care online for non-urgent symptoms. For details visit [email or web address].   Also download the MyChart app! Go to the app store, search "MyChart", open the app, select Center, and log in with your MyChart username and password.

## 2023-01-15 NOTE — Progress Notes (Incomplete)
HEMATOLOGY/ONCOLOGY CLINIC NOTE  Date of Service: 01/15/23   Patient Care Team: Hoyt Koch, MD as PCP - General (Internal Medicine) Lafayette Dragon, MD (Inactive) as Consulting Physician (Gastroenterology) Megan Salon, MD as Consulting Physician (Gynecology) Melrose Nakayama, MD as Consulting Physician (Orthopedic Surgery) Deneise Lever, MD as Consulting Physician (Pulmonary Disease) [name], MD (Ophthalmology)  CHIEF COMPLAINTS/PURPOSE OF VISIT:  Follow-up for continued evaluation and management of multiple myeloma  HISTORY OF PRESENTING ILLNESS:  Please see previous notes for details on initial presentation  Current Treatment: Carfilzomib + Revlimid maintenance.  INTERVAL HISTORY:   Faith Orr is a 82 y.o. female who is here for continued evaluation and management of the patient's multiple myeloma.  Patient was last seen by me on 12/18/22 and was doing well overall with no new medical concerns.  Today,   -Discussed lab results on 01/15/23  with patient. CBC showed WBC of ***, hemoglobin of ***, and platelets of ***. -  MEDICAL HISTORY:  Past Medical History:  Diagnosis Date   Allergy    seasonal   Asthma    DEPRESSION    DIABETES MELLITUS, TYPE II    Diverticulosis    HYPERLIPIDEMIA    Macular degeneration of left eye    mild, Dr.Hecker   Obesity, unspecified    Osteoarthritis of both knees    OSTEOPENIA    Osteopenia    URINARY INCONTINENCE     SURGICAL HISTORY: Past Surgical History:  Procedure Laterality Date   CATARACT EXTRACTION Left 05/24/2018   CESAREAN SECTION  01/1973   CYSTOSCOPY/URETEROSCOPY/HOLMIUM LASER/STENT PLACEMENT Right 09/20/2019   Procedure: CYSTOSCOPY/URETEROSCOPY/HOLMIUM LASER/STENT PLACEMENT;  Surgeon: Lucas Mallow, MD;  Location: [facility];  Service: Urology;  Laterality: Right;   FRACTURE SURGERY     IR IMAGING GUIDED PORT INSERTION  02/20/2019   left wrist surgery  2008   By  Dr. Latanya Maudlin   right ankle  1994    SOCIAL HISTORY: Social History   Socioeconomic History   Marital status: Married    Spouse name: Not on file   Number of children: 1   Years of education: Not on file   Highest education level: Not on file  Occupational History    Employer: [employer]  Tobacco Use   Smoking status: Never   Smokeless tobacco: Never   Tobacco comments:    Lives with partner [name]) and son  Vaping Use   Vaping Use: Never used  Substance and Sexual Activity   Alcohol use: No    Alcohol/week: 0.0 standard drinks of alcohol   Drug use: No   Sexual activity: Never    Partners: Female    Birth control/protection: Post-menopausal    Comment: Lives with female partner (annette hicks) and 56 yo son  Other Topics Concern   Not on file  Social History Narrative   Not on file   Social Determinants of Health   Financial Resource Strain: Low Risk  (02/27/2022)   Overall Financial Resource Strain (CARDIA)    Difficulty of Paying Living Expenses: Not hard at all  Food Insecurity: No Food Insecurity (02/27/2022)   Hunger Vital Sign    Worried About Running Out of Food in the Last Year: Never true    [place] in the Last Year: Never true  Transportation Needs: No Transportation Needs (02/27/2022)   PRAPARE - Transportation    Lack of Transportation (Medical): No    Lack of  Transportation (Non-Medical): No  Physical Activity: Unknown (02/27/2022)   Exercise Vital Sign    Days of Exercise per Week: Patient refused    Minutes of Exercise per Session: Patient refused  Stress: No Stress Concern Present (02/27/2022)   [place]    Feeling of Stress : Not at all  Social Connections: Unknown (02/27/2022)   Social Connection and Isolation Panel [NHANES]    Frequency of Communication with Friends and Family: Patient refused    Frequency of Social Gatherings with Friends and Family:  Patient refused    Attends Religious Services: Patient refused    Active Member of Clubs or Organizations: Patient refused    Attends Archivist Meetings: Patient refused    Marital Status: Living with partner  Intimate Partner Violence: Not At Risk (05/08/2021)   Humiliation, Afraid, Rape, and Kick questionnaire    Fear of Current or Ex-Partner: No    Emotionally Abused: No    Physically Abused: No    Sexually Abused: No    FAMILY HISTORY: Family History  Problem Relation Age of Onset   Diabetes Father    Hyperlipidemia Father    Heart disease Father    Cancer Father    Hypertension Father    Colon cancer Paternal Grandmother 44   Osteoporosis Mother    Protein S deficiency Mother    Hyperlipidemia Mother    Multiple sclerosis Daughter    Cancer Other        bladder   Breast cancer Neg Hx     ALLERGIES:  is allergic to levofloxacin, penicillins, aleve [naproxen sodium], and sulfonamide derivatives.  MEDICATIONS:  Current Outpatient Medications  Medication Sig Dispense Refill   acyclovir (ZOVIRAX) 400 MG tablet Take 1 tablet (400 mg total) by mouth 2 (two) times daily. 60 tablet 5   Blood Glucose Monitoring Suppl (FREESTYLE FREEDOM LITE) W/DEVICE KIT Use to check blood sugars twice a day Dx 250.00 1 each 0   Calcium Carbonate-Vitamin D 600-400 MG-UNIT tablet Take 1 tablet by mouth 2 (two) times daily.     dexamethasone (DECADRON) 4 MG tablet Take 5 tablets (20 mg total) by mouth once a week. On D22 of each cycle of treatment 20 tablet 5   ELIQUIS 2.5 MG TABS tablet TAKE 1 TABLET(2.5 MG) BY MOUTH TWICE DAILY 60 tablet 2   fentaNYL (DURAGESIC) 12 MCG/HR Place 1 patch onto the skin every 3 (three) days. 10 patch 0   fluticasone (FLONASE) 50 MCG/ACT nasal spray Place 1 spray into both nostrils daily. 16 g 2   glucose blood (FREESTYLE LITE) test strip CHECK BLOOD SUGAR TWICE DAILY AS DIRECTED Dx 250.00 180 each 3   Lancets (FREESTYLE) lancets Use twice daily to check  sugars. 100 each 11   lenalidomide (REVLIMID) 5 MG capsule TAKE 1 CAPSULE  (5 mg) BY MOUTH DAILY  FOR 21 DAYS, THEN 7 DAYS OFF 21 capsule 0   lidocaine-prilocaine (EMLA) cream APPLY 1 APPLICATION TO THE AFFECTED AREA AS NEEDED. USE PRIOR TO PORT ACCESS 30 g 0   meclizine (ANTIVERT) 25 MG tablet Take 1 tablet (25 mg total) by mouth every 8 (eight) hours as needed for dizziness. 20 tablet 0   metFORMIN (GLUCOPHAGE-XR) 500 MG 24 hr tablet Take 3 tablets (1,500 mg total) by mouth daily. Annual appt due in Jan must see provider for future refills 180 tablet 0   Multiple Vitamins-Minerals (ICAPS) CAPS Take 1 capsule by mouth daily after breakfast.  ondansetron (ZOFRAN) 8 MG tablet Take 1 tablet (8 mg total) by mouth 2 (two) times daily as needed (Nausea or vomiting). 30 tablet 1   Oxycodone HCl 10 MG TABS Take 1 tablet (10 mg total) by mouth every 6 (six) hours as needed. 90 tablet 0   pantoprazole (PROTONIX) 20 MG tablet Take 1 tablet (20 mg total) by mouth daily. 30 tablet 2   polyethylene glycol (MIRALAX / GLYCOLAX) packet Take 17 g by mouth daily after breakfast.     potassium chloride SA (KLOR-CON M) 20 MEQ tablet TAKE 2 TABLETS BY MOUTH TWICE DAILY 360 tablet 1   prochlorperazine (COMPAZINE) 10 MG tablet Take 1 tablet (10 mg total) by mouth every 6 (six) hours as needed (Nausea or vomiting). 30 tablet 1   senna-docusate (SENNA S) 8.6-50 MG tablet Take 2 tablets by mouth at bedtime. 60 tablet 2   sertraline (ZOLOFT) 100 MG tablet Take 1 tablet (100 mg total) by mouth daily. 90 tablet 3   simvastatin (ZOCOR) 20 MG tablet TAKE 1 TABLET BY MOUTH EVERY DAY AT 6 PM Follow-up appt due must see provider for future refills 90 tablet 0   Vitamin D, Ergocalciferol, (DRISDOL) 1.25 MG (50000 UNIT) CAPS capsule TAKE 1 CAPSULE BY MOUTH EVERY 7 DAYS 12 capsule 0   No current facility-administered medications for this visit.   Facility-Administered Medications Ordered in Other Visits  Medication Dose Route  Frequency Provider Last Rate Last Admin   heparin lock flush 100 unit/mL  500 Units Intracatheter Once PRN Brunetta Genera, MD       sodium chloride flush (NS) 0.9 % injection 10 mL  10 mL Intracatheter PRN Brunetta Genera, MD   10 mL at 10/02/19 1524   sodium chloride flush (NS) 0.9 % injection 10 mL  10 mL Intracatheter PRN Brunetta Genera, MD   10 mL at 12/04/22 1517    REVIEW OF SYSTEMS:    10 Point review of Systems was done is negative except as noted above.   PHYSICAL EXAMINATION: ECOG FS:2 - Symptomatic, <50% confined to bed  There were no vitals filed for this visit.    Wt Readings from Last 3 Encounters:  01/01/23 150 lb 12 oz (68.4 kg)  12/18/22 151 lb 14.4 oz (68.9 kg)  12/04/22 148 lb 6.4 oz (67.3 kg)   There is no height or weight on file to calculate BMI.   Marland Kitchen  GENERAL:alert, in no acute distress and comfortable SKIN: no acute rashes, no significant lesions EYES: conjunctiva are pink and non-injected, sclera anicteric OROPHARYNX: MMM, no exudates, no oropharyngeal erythema or ulceration NECK: supple, no JVD LYMPH:  no palpable lymphadenopathy in the cervical, axillary or inguinal regions LUNGS: clear to auscultation b/l with normal respiratory effort HEART: regular rate & rhythm ABDOMEN:  normoactive bowel sounds , non tender, not distended. Extremity: no pedal edema PSYCH: alert & oriented x 3 with fluent speech NEURO: no focal motor/sensory deficits        Latest Ref Rng & Units 01/01/2023   12:10 PM 12/18/2022   11:34 AM 12/04/2022   12:52 PM  CBC  WBC 4.0 - 10.5 K/uL 3.6  3.3  3.3   Hemoglobin 12.0 - 15.0 g/dL 11.7  11.1  11.6   Hematocrit 36.0 - 46.0 % 34.4  32.6  34.0   Platelets 150 - 400 K/uL 99  145  107       Latest Ref Rng & Units 01/01/2023   12:10 PM 12/18/2022  11:34 AM 12/04/2022   12:52 PM  CMP  Glucose 70 - 99 mg/dL 200  217  174   BUN 8 - 23 mg/dL '8  10  15   '$ Creatinine 0.44 - 1.00 mg/dL 1.03  1.13  0.98   Sodium  135 - 145 mmol/L 137  137  137   Potassium 3.5 - 5.1 mmol/L 3.5  3.5  3.4   Chloride 98 - 111 mmol/L 111  112  111   CO2 22 - 32 mmol/L '18  17  20   '$ Calcium 8.9 - 10.3 mg/dL 8.5  8.6  9.0   Total Protein 6.5 - 8.1 g/dL 5.8  5.6  6.1   Total Bilirubin 0.3 - 1.2 mg/dL 0.7  0.5  0.7   Alkaline Phos 38 - 126 U/L 102  102  101   AST 15 - 41 U/L '9  11  9   '$ ALT 0 - 44 U/L '10  10  9      '$ 09/18/2019 BM Bx Report ([id number])   09/18/2019 FISH Panel    05/30/2019 BM Bx   01/06/2019 BM Bx:     01/06/19 Cytogenetics:      05/30/19 BM Biopsy:   09/18/2019 FISH Panel    09/18/2019 BM Surgical Pathology ([id number])     RADIOGRAPHIC STUDIES: I have personally reviewed the radiological images as listed and agreed with the findings in the report. No results found.   ASSESSMENT & PLAN:   82 y.o. female with  1.  Nonsecretory multiple Myeloma, RISS Stage III  Labs upon initial presentation from 12/08/18, blood counts are normal including WBC at 7.1k, HGB at 13.1, and PLT at 245k. Calcium normal at 10.3. Creatinine normal at 0.63. M spike at 0.5g. 12/13/18 Bone Scan revealed Multifocal uptake throughout the skeleton, consistent with diffuse metastatic disease. Primary tumor is not specified. 2. Uptake in the proximal right femur, consistent with lytic lesions. 3. Uptake in the ribs bilaterally as described. 4. Lesions in the proximal left humerus. 5. Diffuse uptake throughout the skull consistent with metastatic disease. 6. Right paramedian uptake at the manubrium.  12/13/18 CT Right Femur revealed Numerous lytic lesions involving the right femur and a lytic lesion in the left inferior pubic ramus. Overall appearance is most concerning for multiple myeloma  12/27/18 Pretreatment 24hour UPEP observed an M spike at '18mg'$ , and showed '199mg'$  total protein/day.  12/27/18 Pretreatment MMP revealed M Protein at 0.5g with IgG Lambda specificity. Kappa:Lambda light chain ratio at  0.13, with Lambda at 40.3. There is less abnormal protein and light chains than I would expect from 30% plasma cells, which suggests hypo-secretory or non-secretory neoplastic plasma cells. Will have an impact in assessing response. 01/05/19 PET/CT revealed Innumerable lytic lesions in the skeleton compatible with myeloma. Most of the larger lesions are hypermetabolic, for example including a left proximal humeral shaft lesion with maximum SUV of 8.1 and a 2.8 cm lesion in the left T9 vertebral body with maximum SUV 5.1. Most of the smaller lytic lesions, and some of the larger lesions, do not demonstrate accentuated metabolic activity. 2. 1.2 cm in short axis lymph node in the left parapharyngeal space is hypermetabolic with maximum SUV 11.8. I do not see a separate mass in the head and neck to give rise to this hypermetabolic lymph node. 3. Mosaic attenuation in the lower lobes, nonspecific possibly from air trapping. 4.  Aortic Atherosclerosis 5. Heterogeneous activity in the liver, making it hard to exclude small liver lesions.  Consider hepatic protocol MRI with and without contrast for definitive assessment. Nonobstructive right nephrolithiasis. Old granulomatous disease  01/06/19 Bone Marrow biopsy revealed interstitial increase in plasma cells (28% aspirate, 40% CD138 immunohistochemistry). Plasma cells negative for light chains consistent with a non or weakly secretory myeloma   01/06/19 Cytogenetics revealed 37% of cells with trisomy 11 or 11q deletion, and 40.5% of cells with 17p mutation  S/p 5 cycles of KRD treatment  05/31/19 BM Biopsy revealed mild atypical plasmacytosis at 5% with polytypic variation.   06/01/19 PET/CT revealed "Dominant lesion in the LEFT humerus is decreased significantly in metabolic activity. Additional hypermetabolic skeletal lytic lesions have decreased in metabolic activity or similar to comparison exam (01/05/2019). No evidence of disease progression. 2. Multiple  additional lytic lesions do not have metabolic activity and unchanged. 3. No new skeletal lesions are identified. No soft tissue plasmacytoma identified. 4. Nodule / node in the LEFT parapharyngeal space which is intensely hypermetabolic not changed from prior. 5. New hypermetabolic LEFT lower lobe pulmonary nodule is indeterminate. Recommend close attention on follow-up 6. New obstructive hydronephrosis of the RIGHT kidney related to RIGHT UPJ stone."  09/18/2019 BM Bx Report which revealed "Slightly hypercellular bone marrow for age with trilineage hematopoiesis and 1% plasma cells."  09/14/2019 PET/CT Whole Body Scan ([id number]) which revealed "1. There widespread tiny lytic lesions compatible with multiple myeloma. Index larger lesions are generally similar to the prior exam, with low-grade activity such as the left T9 vertebral body lesion with maximum SUV 4.5. Is mild increase in the activity associated with a mildly sclerotic left proximal humeral lesion, maximum SUV 4.8 (previously 3.5). 2. At the site of the prior left lower lobe nodule is currently more bandlike thickening, with maximum SUV only 1.9, probably benign, continued surveillance of this region suggested. 3. There several small but hypermetabolic lymph nodes. This includes a left parapharyngeal space node measuring 1.0 cm with maximum SUV 12.3 (stable); a left level IB lymph node measuring 0.5 cm with maximum SUV 4.8 (slightly larger than prior); and a left inguinal lymph node measuring 0.7 cm in short axis with maximum SUV 6.4 (previously 0.5 cm with maximum SUV 0.6). Significance of these lymph nodes uncertain, surveillance is recommended. 4. New 5 mm left lower lobe subpleural nodule on image 32/8, not appreciably hypermetabolic, surveillance suggested. 5. Focal subcutaneous stranding along the left perineum measuring about 2.6 by 1.1 cm on image 221/4, maximum SUV 12.5. This was not present previously and is most likely inflammatory,  although given the notable SUV, surveillance of this region is suggested. 6. Other imaging findings of potential clinical significance: Aortic Atherosclerosis (ICD10-I70.0). Coronary atherosclerosis. Old granulomatous disease. Mild right hydronephrosis due to a 7 mm right UPJ calculus. 2 mm right kidney upper pole nonobstructive renal calculus. Prominent stool throughout the colon favors constipation."  12/19/2019 Thoracic & Lumbar Spine MRI ([id number]) ([id number]) revealed "Suspected myeloma lesions at T9 and S1. No compression deformity or epidural disease.  01/18/2020 PET/CT ([id number]) which revealed "1. Stable lytic lesions throughout the skeleton. The larger lytic lesions which had mild metabolic activity on comparison exam now have background metabolic activity. No evidence of active myeloma. No evidence of progression multiple myeloma.  No plasmacytoma 3. Hypermetabolic nodules in the LEFT neck may be associated deep tissues of the LEFT parotid gland. 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  01/17/19 MRI Liver revealed Several appreciable liver lesions all have benign imaging characteristics. No MRI findings of metastatic involvement of the liver. 2. Scattered bony lesions corresponding to the lytic lesions seen at PET-CT, compatible with active myeloma. 3. Aortic Atherosclerosis.  Mild cardiomegaly. 4. Diffuse hepatic steatosis.   3. Left lower lobe pulmonary nodule First seen on 06/01/19 PET/CT PET/CT 6/65/9935: No hypermetabolic mediastinal or hilar nodes. No suspicious pulmonary nodules on the CT scan.  4. 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 Being managed conservatively as per patient's preference.   No symptoms from this currently.  5. Mild chronic kidney disease CMP done 04/09/2022 showed creatinine of 1.17 and GFR est of 48 consistent with mild chronic kidney disease.  PLAN: -Labs done today on 12/18/2022 were discussed with the patient in detail CBC shows mild anemia with a hemoglobin of 11.1 and mild leukopenia with a WBC count 3.3k and platelets of 145k -CMP is stable. -Multiple myeloma labs and light chains show no overt evidence of disease progression however the patient has nonsecretory myeloma. -Continue maintenance Revlimid at 5 mg p.o. daily 3 weeks on 1 week off -Continue carfilzomib 36 mg/m every 2 weeks for maintenance with the same supportive medications. -Continue Eliquis 2.5 mg p.o. twice daily for VTE prophylaxis -Continue Zometa every 4 weeks for bone directed therapy. -We discussed getting bone marrow biopsy and repeat PET CT scan to monitor her disease status given she has nonsecretory myeloma. -Treatment orders were reviewed and placed - FOLLOW UP: ***  The total time spent in the appointment was *** minutes* .  All of the patient's questions were answered with apparent satisfaction. The patient knows to call the clinic with any problems, questions or concerns.   Sullivan Lone MD MS AAHIVMS [facility] Hematology/Oncology Physician [facility]  .*Total Encounter Time as defined by the Centers for Medicare and Medicaid Services includes, in addition to the face-to-face time of a patient visit (documented in the note above) non-face-to-face time: obtaining and reviewing outside history, ordering and reviewing medications, tests or procedures, care coordination (communications with other health care professionals or caregivers) and documentation in the medical record.   I,Mitra Faeizi,acting as a Education administrator for Sullivan Lone, MD.,have documented all relevant documentation on the behalf of Sullivan Lone, MD,as directed by  Sullivan Lone, MD while in the presence  of Sullivan Lone, MD.  ***

## 2023-01-18 LAB — KAPPA/LAMBDA LIGHT CHAINS
Kappa free light chain: 20.5 mg/L — ABNORMAL HIGH (ref 3.3–19.4)
Kappa, lambda light chain ratio: 1.16 (ref 0.26–1.65)
Lambda free light chains: 17.7 mg/L (ref 5.7–26.3)

## 2023-01-21 LAB — MULTIPLE MYELOMA PANEL, SERUM
Albumin SerPl Elph-Mcnc: 3.4 g/dL (ref 2.9–4.4)
Albumin/Glob SerPl: 1.3 (ref 0.7–1.7)
Alpha 1: 0.3 g/dL (ref 0.0–0.4)
Alpha2 Glob SerPl Elph-Mcnc: 0.8 g/dL (ref 0.4–1.0)
B-Globulin SerPl Elph-Mcnc: 0.9 g/dL (ref 0.7–1.3)
Gamma Glob SerPl Elph-Mcnc: 0.7 g/dL (ref 0.4–1.8)
Globulin, Total: 2.7 g/dL (ref 2.2–3.9)
IgA: 200 mg/dL (ref 64–422)
IgG (Immunoglobin G), Serum: 654 mg/dL (ref 586–1602)
IgM (Immunoglobulin M), Srm: 20 mg/dL — ABNORMAL LOW (ref 26–217)
Total Protein ELP: 6.1 g/dL (ref 6.0–8.5)

## 2023-01-22 ENCOUNTER — Encounter (HOSPITAL_COMMUNITY)
Admission: RE | Admit: 2023-01-22 | Discharge: 2023-01-22 | Disposition: A | Discharge status: Home / Self Care | Payer: Medicare PPO | Source: Ambulatory Visit | Attending: Hematology | Admitting: Hematology

## 2023-01-22 DIAGNOSIS — C9000 Multiple myeloma not having achieved remission: Secondary | ICD-10-CM | POA: Insufficient documentation

## 2023-01-22 DIAGNOSIS — Z5111 Encounter for antineoplastic chemotherapy: Secondary | ICD-10-CM | POA: Diagnosis not present

## 2023-01-22 LAB — GLUCOSE, CAPILLARY: Glucose-Capillary: 137 mg/dL — ABNORMAL HIGH (ref 70–99)

## 2023-01-22 MED ORDER — FLUDEOXYGLUCOSE F - 18 (FDG) INJECTION
7.4700 | Freq: Once | INTRAVENOUS | Status: AC | PRN
  Administered 2023-01-22: 7.47 via INTRAVENOUS

## 2023-01-26 ENCOUNTER — Other Ambulatory Visit: Payer: Self-pay | Admitting: Hematology

## 2023-01-26 ENCOUNTER — Other Ambulatory Visit: Payer: Self-pay

## 2023-01-26 DIAGNOSIS — C9000 Multiple myeloma not having achieved remission: Secondary | ICD-10-CM

## 2023-01-28 ENCOUNTER — Encounter: Payer: Self-pay | Admitting: Hematology

## 2023-01-28 ENCOUNTER — Other Ambulatory Visit: Payer: Self-pay

## 2023-01-28 DIAGNOSIS — C9000 Multiple myeloma not having achieved remission: Secondary | ICD-10-CM

## 2023-01-29 ENCOUNTER — Other Ambulatory Visit: Payer: Self-pay

## 2023-01-29 ENCOUNTER — Inpatient Hospital Stay (HOSPITAL_BASED_OUTPATIENT_CLINIC_OR_DEPARTMENT_OTHER): Payer: Medicare PPO | Admitting: Hematology

## 2023-01-29 ENCOUNTER — Inpatient Hospital Stay: Payer: Medicare PPO

## 2023-01-29 VITALS — BP 133/64 | HR 69 | Temp 97.8°F | Resp 18 | Wt 148.0 lb

## 2023-01-29 DIAGNOSIS — C9000 Multiple myeloma not having achieved remission: Secondary | ICD-10-CM

## 2023-01-29 DIAGNOSIS — Z5111 Encounter for antineoplastic chemotherapy: Secondary | ICD-10-CM | POA: Diagnosis not present

## 2023-01-29 DIAGNOSIS — Z95828 Presence of other vascular implants and grafts: Secondary | ICD-10-CM

## 2023-01-29 DIAGNOSIS — Z5112 Encounter for antineoplastic immunotherapy: Secondary | ICD-10-CM | POA: Diagnosis not present

## 2023-01-29 DIAGNOSIS — Z7189 Other specified counseling: Secondary | ICD-10-CM

## 2023-01-29 DIAGNOSIS — Z79899 Other long term (current) drug therapy: Secondary | ICD-10-CM | POA: Diagnosis not present

## 2023-01-29 LAB — CBC WITH DIFFERENTIAL (CANCER CENTER ONLY)
Abs Immature Granulocytes: 0.01 10*3/uL (ref 0.00–0.07)
Basophils Absolute: 0 10*3/uL (ref 0.0–0.1)
Basophils Relative: 1 %
Eosinophils Absolute: 0.2 10*3/uL (ref 0.0–0.5)
Eosinophils Relative: 5 %
HCT: 33.8 % — ABNORMAL LOW (ref 36.0–46.0)
Hemoglobin: 11.6 g/dL — ABNORMAL LOW (ref 12.0–15.0)
Immature Granulocytes: 0 %
Lymphocytes Relative: 28 %
Lymphs Abs: 0.8 10*3/uL (ref 0.7–4.0)
MCH: 35.6 pg — ABNORMAL HIGH (ref 26.0–34.0)
MCHC: 34.3 g/dL (ref 30.0–36.0)
MCV: 103.7 fL — ABNORMAL HIGH (ref 80.0–100.0)
Monocytes Absolute: 0.8 10*3/uL (ref 0.1–1.0)
Monocytes Relative: 26 %
Neutro Abs: 1.2 10*3/uL — ABNORMAL LOW (ref 1.7–7.7)
Neutrophils Relative %: 40 %
Platelet Count: 97 10*3/uL — ABNORMAL LOW (ref 150–400)
RBC: 3.26 MIL/uL — ABNORMAL LOW (ref 3.87–5.11)
RDW: 15.1 % (ref 11.5–15.5)
WBC Count: 2.9 10*3/uL — ABNORMAL LOW (ref 4.0–10.5)
nRBC: 0 % (ref 0.0–0.2)

## 2023-01-29 LAB — CMP (CANCER CENTER ONLY)
ALT: 11 U/L (ref 0–44)
AST: 10 U/L — ABNORMAL LOW (ref 15–41)
Albumin: 3.8 g/dL (ref 3.5–5.0)
Alkaline Phosphatase: 99 U/L (ref 38–126)
Anion gap: 8 (ref 5–15)
BUN: 9 mg/dL (ref 8–23)
CO2: 17 mmol/L — ABNORMAL LOW (ref 22–32)
Calcium: 8 mg/dL — ABNORMAL LOW (ref 8.9–10.3)
Chloride: 113 mmol/L — ABNORMAL HIGH (ref 98–111)
Creatinine: 0.99 mg/dL (ref 0.44–1.00)
GFR, Estimated: 58 mL/min — ABNORMAL LOW (ref >60)
Glucose, Bld: 183 mg/dL — ABNORMAL HIGH (ref 70–99)
Potassium: 3.6 mmol/L (ref 3.5–5.1)
Sodium: 138 mmol/L (ref 135–145)
Total Bilirubin: 0.7 mg/dL (ref 0.3–1.2)
Total Protein: 6.1 g/dL — ABNORMAL LOW (ref 6.5–8.1)

## 2023-01-29 MED ORDER — SODIUM CHLORIDE 0.9% FLUSH
10.0000 mL | INTRAVENOUS | Status: DC | PRN
  Administered 2023-01-29: 10 mL

## 2023-01-29 MED ORDER — OXYCODONE HCL 10 MG PO TABS: 10.0000 mg | ORAL_TABLET | Freq: Four times a day (QID) | ORAL | 0 refills | Status: DC | PRN

## 2023-01-29 MED ORDER — SODIUM CHLORIDE 0.9 % IV SOLN: Freq: Once | INTRAVENOUS | Status: AC

## 2023-01-29 MED ORDER — ACETAMINOPHEN 325 MG PO TABS
650.0000 mg | ORAL_TABLET | Freq: Once | ORAL | Status: AC
  Administered 2023-01-29: 650 mg via ORAL
  Filled 2023-01-29: qty 2

## 2023-01-29 MED ORDER — FAMOTIDINE 20 MG PO TABS
20.0000 mg | ORAL_TABLET | Freq: Once | ORAL | Status: AC
  Administered 2023-01-29: 20 mg via ORAL
  Filled 2023-01-29: qty 1

## 2023-01-29 MED ORDER — SODIUM CHLORIDE 0.9 % IV SOLN: Freq: Once | INTRAVENOUS | Status: DC

## 2023-01-29 MED ORDER — ZOLEDRONIC ACID 4 MG/100ML IV SOLN
4.0000 mg | Freq: Once | INTRAVENOUS | Status: AC
  Administered 2023-01-29: 4 mg via INTRAVENOUS
  Filled 2023-01-29: qty 100

## 2023-01-29 MED ORDER — DEXTROSE 5 % IV SOLN
36.0000 mg/m2 | Freq: Once | INTRAVENOUS | Status: AC
  Administered 2023-01-29: 60 mg via INTRAVENOUS
  Filled 2023-01-29: qty 30

## 2023-01-29 MED ORDER — HEPARIN SOD (PORK) LOCK FLUSH 100 UNIT/ML IV SOLN
500.0000 [IU] | Freq: Once | INTRAVENOUS | Status: AC | PRN
  Administered 2023-01-29: 500 [IU]

## 2023-01-29 MED ORDER — SODIUM CHLORIDE 0.9% FLUSH
10.0000 mL | Freq: Once | INTRAVENOUS | Status: AC
  Administered 2023-01-29: 10 mL

## 2023-01-29 MED ORDER — PROCHLORPERAZINE MALEATE 10 MG PO TABS
10.0000 mg | ORAL_TABLET | Freq: Once | ORAL | Status: AC
  Administered 2023-01-29: 10 mg via ORAL
  Filled 2023-01-29: qty 1

## 2023-01-29 MED ORDER — DEXAMETHASONE 4 MG PO TABS
12.0000 mg | ORAL_TABLET | Freq: Once | ORAL | Status: AC
  Administered 2023-01-29: 12 mg via ORAL
  Filled 2023-01-29: qty 3

## 2023-01-29 MED ORDER — DIPHENHYDRAMINE HCL 25 MG PO CAPS
25.0000 mg | ORAL_CAPSULE | Freq: Once | ORAL | Status: AC
  Administered 2023-01-29: 25 mg via ORAL
  Filled 2023-01-29: qty 1

## 2023-01-29 NOTE — Patient Instructions (Signed)
[facility]  Discharge Instructions: Thank you for choosing [facility] to provide your oncology and hematology care.   If you have a lab appointment with the [facility], please go directly to the [facility] and check in at the registration area.   Wear comfortable clothing and clothing appropriate for easy access to any Portacath or PICC line.   We strive to give you quality time with your provider. You may need to reschedule your appointment if you arrive late (15 or more minutes).  Arriving late affects you and other patients whose appointments are after yours.  Also, if you miss three or more appointments without notifying the office, you may be dismissed from the clinic at the provider's discretion.      For prescription refill requests, have your pharmacy contact our office and allow 72 hours for refills to be completed.    Today you received the following chemotherapy and/or immunotherapy agents Kyprolis & Zometa     To help prevent nausea and vomiting after your treatment, we encourage you to take your nausea medication as directed.  BELOW ARE SYMPTOMS THAT SHOULD BE REPORTED IMMEDIATELY: *FEVER GREATER THAN 100.4 F (38 C) OR HIGHER *CHILLS OR SWEATING *NAUSEA AND VOMITING THAT IS NOT CONTROLLED WITH YOUR NAUSEA MEDICATION *UNUSUAL SHORTNESS OF BREATH *UNUSUAL BRUISING OR BLEEDING *URINARY PROBLEMS (pain or burning when urinating, or frequent urination) *BOWEL PROBLEMS (unusual diarrhea, constipation, pain near the anus) TENDERNESS IN MOUTH AND THROAT WITH OR WITHOUT PRESENCE OF ULCERS (sore throat, sores in mouth, or a toothache) UNUSUAL RASH, SWELLING OR PAIN  UNUSUAL VAGINAL DISCHARGE OR ITCHING   Items with * indicate a potential emergency and should be followed up as soon as possible or go to the Emergency Department if any problems should occur.  Please show the CHEMOTHERAPY ALERT CARD or IMMUNOTHERAPY ALERT CARD at  check-in to the Emergency Department and triage nurse.  Should you have questions after your visit or need to cancel or reschedule your appointment, please contact [facility]  Dept: [phone]  and follow the prompts.  Office hours are 8:00 a.m. to 4:30 p.m. Monday - Friday. Please note that voicemails left after 4:00 p.m. may not be returned until the following business day.  We are closed weekends and major holidays. You have access to a nurse at all times for urgent questions. Please call the main number to the clinic Dept: [phone] and follow the prompts.   For any non-urgent questions, you may also contact your provider using MyChart. We now offer e-Visits for anyone 5 and older to request care online for non-urgent symptoms. For details visit [email or web address].   Also download the MyChart app! Go to the app store, search "MyChart", open the app, select [facility], and log in with your MyChart username and password.

## 2023-01-29 NOTE — Progress Notes (Signed)
Per Dr. Irene Limbo, okay to treat with calcium 8.0, Plts 97, and ANC 1.2

## 2023-01-29 NOTE — Progress Notes (Signed)
HEMATOLOGY/ONCOLOGY CLINIC NOTE  Date of Service: 01/29/23   Patient Care Team: Hoyt Koch, MD as PCP - General (Internal Medicine) Lafayette Dragon, MD (Inactive) as Consulting Physician (Gastroenterology) Megan Salon, MD as Consulting Physician (Gynecology) Melrose Nakayama, MD as Consulting Physician (Orthopedic Surgery) Deneise Lever, MD as Consulting Physician (Pulmonary Disease) [name], MD (Ophthalmology)  CHIEF COMPLAINTS/PURPOSE OF VISIT:  Follow-up for continued evaluation and management of multiple myeloma  HISTORY OF PRESENTING ILLNESS:  Please see previous notes for details on initial presentation  Current Treatment: Carfilzomib + Revlimid maintenance.  INTERVAL HISTORY:   Faith Orr is a 82 y.o. female who is here for continued evaluation and management of the patient's multiple myeloma. Patient was last seen by me on 12/18/22 and was doing well overall.  Today, she complains of occasional pain in her left upper shoulder/neck area. She also complains of generalized intermittent "deep" pain in her left arm that began 1 week after receiving treatment. She reports that her symptoms are different from before and denies a cramping sensation.   Her fatigue is not too limiting and she is able to engage in everyday activities. She denies any new infection issues, fever, leg swelling or chills. Her nausea has improved with pantoprazole and she has no heartburn symptoms. She denies any abdominal pain on a persistent basis. She reports that she will receive a bone marrow biopsy on February 16, 2023.  MEDICAL HISTORY:  Past Medical History:  Diagnosis Date   Allergy    seasonal   Asthma    DEPRESSION    DIABETES MELLITUS, TYPE II    Diverticulosis    HYPERLIPIDEMIA    Macular degeneration of left eye    mild, Dr.Hecker   Obesity, unspecified    Osteoarthritis of both knees    OSTEOPENIA    Osteopenia    URINARY INCONTINENCE     SURGICAL  HISTORY: Past Surgical History:  Procedure Laterality Date   CATARACT EXTRACTION Left 05/24/2018   CESAREAN SECTION  01/1973   CYSTOSCOPY/URETEROSCOPY/HOLMIUM LASER/STENT PLACEMENT Right 09/20/2019   Procedure: CYSTOSCOPY/URETEROSCOPY/HOLMIUM LASER/STENT PLACEMENT;  Surgeon: Lucas Mallow, MD;  Location: [facility];  Service: Urology;  Laterality: Right;   FRACTURE SURGERY     IR IMAGING GUIDED PORT INSERTION  02/20/2019   left wrist surgery  2008   By Dr. Latanya Maudlin   right ankle  1994    SOCIAL HISTORY: Social History   Socioeconomic History   Marital status: Married    Spouse name: Not on file   Number of children: 1   Years of education: Not on file   Highest education level: Not on file  Occupational History    Employer: [employer]  Tobacco Use   Smoking status: Never   Smokeless tobacco: Never   Tobacco comments:    Lives with partner [name]) and son  Vaping Use   Vaping Use: Never used  Substance and Sexual Activity   Alcohol use: No    Alcohol/week: 0.0 standard drinks of alcohol   Drug use: No   Sexual activity: Never    Partners: Female    Birth control/protection: Post-menopausal    Comment: Lives with female partner (annette hicks) and 38 yo son  Other Topics Concern   Not on file  Social History Narrative   Not on file   Social Determinants of Health   Financial Resource Strain: Low Risk  (02/27/2022)   Overall Emergency planning/management officer Strain (  CARDIA)    Difficulty of Paying Living Expenses: Not hard at all  Food Insecurity: No Food Insecurity (02/27/2022)   Hunger Vital Sign    Worried About Running Out of Food in the Last Year: Never true    Ran Out of Food in the Last Year: Never true  Transportation Needs: No Transportation Needs (02/27/2022)   PRAPARE - Hydrologist (Medical): No    Lack of Transportation (Non-Medical): No  Physical Activity: Unknown (02/27/2022)   Exercise Vital Sign     Days of Exercise per Week: Patient refused    Minutes of Exercise per Session: Patient refused  Stress: No Stress Concern Present (02/27/2022)   [place]    Feeling of Stress : Not at all  Social Connections: Unknown (02/27/2022)   Social Connection and Isolation Panel [NHANES]    Frequency of Communication with Friends and Family: Patient refused    Frequency of Social Gatherings with Friends and Family: Patient refused    Attends Religious Services: Patient refused    Active Member of Clubs or Organizations: Patient refused    Attends Archivist Meetings: Patient refused    Marital Status: Living with partner  Intimate Partner Violence: Not At Risk (05/08/2021)   Humiliation, Afraid, Rape, and Kick questionnaire    Fear of Current or Ex-Partner: No    Emotionally Abused: No    Physically Abused: No    Sexually Abused: No    FAMILY HISTORY: Family History  Problem Relation Age of Onset   Diabetes Father    Hyperlipidemia Father    Heart disease Father    Cancer Father    Hypertension Father    Colon cancer Paternal Grandmother 48   Osteoporosis Mother    Protein S deficiency Mother    Hyperlipidemia Mother    Multiple sclerosis Daughter    Cancer Other        bladder   Breast cancer Neg Hx     ALLERGIES:  is allergic to levofloxacin, penicillins, aleve [naproxen sodium], and sulfonamide derivatives.  MEDICATIONS:  Current Outpatient Medications  Medication Sig Dispense Refill   acyclovir (ZOVIRAX) 400 MG tablet Take 1 tablet (400 mg total) by mouth 2 (two) times daily. 60 tablet 5   Blood Glucose Monitoring Suppl (FREESTYLE FREEDOM LITE) W/DEVICE KIT Use to check blood sugars twice a day Dx 250.00 1 each 0   Calcium Carbonate-Vitamin D 600-400 MG-UNIT tablet Take 1 tablet by mouth 2 (two) times daily.     dexamethasone (DECADRON) 4 MG tablet Take 5 tablets (20 mg total) by mouth once a week. On  D22 of each cycle of treatment 20 tablet 5   ELIQUIS 2.5 MG TABS tablet TAKE 1 TABLET(2.5 MG) BY MOUTH TWICE DAILY 60 tablet 2   fentaNYL (DURAGESIC) 12 MCG/HR Place 1 patch onto the skin every 3 (three) days. 10 patch 0   fluticasone (FLONASE) 50 MCG/ACT nasal spray Place 1 spray into both nostrils daily. 16 g 2   glucose blood (FREESTYLE LITE) test strip CHECK BLOOD SUGAR TWICE DAILY AS DIRECTED Dx 250.00 180 each 3   Lancets (FREESTYLE) lancets Use twice daily to check sugars. 100 each 11   lenalidomide (REVLIMID) 5 MG capsule TAKE 1 CAPSULE BY MOUTH DAILY  FOR 21 DAYS, THEN 7 DAYS OFF 21 capsule 0   lidocaine-prilocaine (EMLA) cream APPLY 1 APPLICATION TO THE AFFECTED AREA AS NEEDED. USE PRIOR TO PORT ACCESS 30  g 0   meclizine (ANTIVERT) 25 MG tablet Take 1 tablet (25 mg total) by mouth every 8 (eight) hours as needed for dizziness. 20 tablet 0   metFORMIN (GLUCOPHAGE-XR) 500 MG 24 hr tablet Take 3 tablets (1,500 mg total) by mouth daily. Annual appt due in Jan must see provider for future refills 180 tablet 0   Multiple Vitamins-Minerals (ICAPS) CAPS Take 1 capsule by mouth daily after breakfast.     ondansetron (ZOFRAN) 8 MG tablet Take 1 tablet (8 mg total) by mouth 2 (two) times daily as needed (Nausea or vomiting). 30 tablet 1   Oxycodone HCl 10 MG TABS Take 1 tablet (10 mg total) by mouth every 6 (six) hours as needed. 90 tablet 0   pantoprazole (PROTONIX) 20 MG tablet Take 1 tablet (20 mg total) by mouth daily. 30 tablet 2   polyethylene glycol (MIRALAX / GLYCOLAX) packet Take 17 g by mouth daily after breakfast.     potassium chloride SA (KLOR-CON M) 20 MEQ tablet TAKE 2 TABLETS BY MOUTH TWICE DAILY 360 tablet 1   prochlorperazine (COMPAZINE) 10 MG tablet Take 1 tablet (10 mg total) by mouth every 6 (six) hours as needed (Nausea or vomiting). 30 tablet 1   senna-docusate (SENNA S) 8.6-50 MG tablet Take 2 tablets by mouth at bedtime. 60 tablet 2   sertraline (ZOLOFT) 100 MG tablet Take  1 tablet (100 mg total) by mouth daily. 90 tablet 3   simvastatin (ZOCOR) 20 MG tablet TAKE 1 TABLET BY MOUTH EVERY DAY AT 6 PM Follow-up appt due must see provider for future refills 90 tablet 0   Vitamin D, Ergocalciferol, (DRISDOL) 1.25 MG (50000 UNIT) CAPS capsule TAKE 1 CAPSULE BY MOUTH EVERY 7 DAYS 12 capsule 0   No current facility-administered medications for this visit.   Facility-Administered Medications Ordered in Other Visits  Medication Dose Route Frequency Provider Last Rate Last Admin   heparin lock flush 100 unit/mL  500 Units Intracatheter Once PRN Brunetta Genera, MD       sodium chloride flush (NS) 0.9 % injection 10 mL  10 mL Intracatheter PRN Brunetta Genera, MD   10 mL at 10/02/19 1524   sodium chloride flush (NS) 0.9 % injection 10 mL  10 mL Intracatheter PRN Brunetta Genera, MD   10 mL at 12/04/22 1517    REVIEW OF SYSTEMS:    10 Point review of Systems was done is negative except as noted above.  PHYSICAL EXAMINATION: ECOG FS:2 - Symptomatic, <50% confined to bed VS stable.  Wt Readings from Last 3 Encounters:  01/15/23 149 lb 8 oz (67.8 kg)  01/01/23 150 lb 12 oz (68.4 kg)  12/18/22 151 lb 14.4 oz (68.9 kg)   There is no height or weight on file to calculate BMI.   GENERAL:alert, in no acute distress and comfortable SKIN: no acute rashes, no significant lesions EYES: conjunctiva are pink and non-injected, sclera anicteric OROPHARYNX: MMM, no exudates, no oropharyngeal erythema or ulceration NECK: supple, no JVD LYMPH:  no palpable lymphadenopathy in the cervical, axillary or inguinal regions LUNGS: clear to auscultation b/l with normal respiratory effort HEART: regular rate & rhythm ABDOMEN:  normoactive bowel sounds , non tender, not distended. Extremity: no pedal edema PSYCH: alert & oriented x 3 with fluent speech NEURO: no focal motor/sensory deficits       Latest Ref Rng & Units 01/29/2023    1:13 PM 01/15/2023   12:14 PM  01/01/2023   12:10  PM  CBC  WBC 4.0 - 10.5 K/uL 2.9  3.9  3.6   Hemoglobin 12.0 - 15.0 g/dL 11.6  11.8  11.7   Hematocrit 36.0 - 46.0 % 33.8  34.1  34.4   Platelets 150 - 400 K/uL 97  155  99       Latest Ref Rng & Units 01/29/2023    1:13 PM 01/15/2023   12:14 PM 01/01/2023   12:10 PM  CMP  Glucose 70 - 99 mg/dL 183  180  200   BUN 8 - 23 mg/dL 9  7  8   $ Creatinine 0.44 - 1.00 mg/dL 0.99  0.94  1.03   Sodium 135 - 145 mmol/L 138  137  137   Potassium 3.5 - 5.1 mmol/L 3.6  3.7  3.5   Chloride 98 - 111 mmol/L 113  111  111   CO2 22 - 32 mmol/L 17  18  18   $ Calcium 8.9 - 10.3 mg/dL 8.0  9.1  8.5   Total Protein 6.5 - 8.1 g/dL 6.1  6.0  5.8   Total Bilirubin 0.3 - 1.2 mg/dL 0.7  0.6  0.7   Alkaline Phos 38 - 126 U/L 99  101  102   AST 15 - 41 U/L 10  11  9   $ ALT 0 - 44 U/L 11  10  10      $ 09/18/2019 BM Bx Report ([id number])   09/18/2019 FISH Panel    05/30/2019 BM Bx   01/06/2019 BM Bx:     01/06/19 Cytogenetics:      05/30/19 BM Biopsy:   09/18/2019 FISH Panel    09/18/2019 BM Surgical Pathology ([id number])     RADIOGRAPHIC STUDIES: I have personally reviewed the radiological images as listed and agreed with the findings in the report. NM PET Image Restage (PS) Whole Body  Result Date: 01/23/2023 CLINICAL DATA:  Subsequent treatment strategy for nonsecretory multiple myeloma. EXAM: NUCLEAR MEDICINE PET WHOLE BODY TECHNIQUE: 7.5 mCi F-18 FDG was injected intravenously. Full-ring PET imaging was performed from the head to foot after the radiotracer. CT data was obtained and used for attenuation correction and anatomic localization. Fasting blood glucose: 137 mg/dl COMPARISON:  06/10/2022 PET-CT. FINDINGS: Mediastinal blood pool activity: SUV max 2.9 HEAD/NECK: No hypermetabolic activity in the scalp. Hypermetabolic 1.0 cm high left lateral retropharyngeal lymph node with max SUV 8.8 (series 4/image 43), previously 1.0 cm with max SUV 10.1, mildly  decreased in metabolism. Hypermetabolic 0.9 cm left level 2 neck lymph node with max SUV 18.9 (series 4/image 54), previously 0.9 cm with max SUV 16.9, mildly increased in metabolism. No new pathologically enlarged or newly hypermetabolic cervical lymph nodes. Incidental CT findings: Right internal jugular Port-A-Cath terminates at the cavoatrial junction. CHEST: No enlarged or hypermetabolic axillary, mediastinal or hilar lymph nodes. No hypermetabolic pulmonary findings. Incidental CT findings: Coronary atherosclerosis. Atherosclerotic nonaneurysmal thoracic aorta. Stable granulomatous calcified left hilar lymph nodes and posterior left upper lobe calcified 1.1 cm granuloma. ABDOMEN/PELVIS: No abnormal hypermetabolic activity within the liver, pancreas, adrenal glands, or spleen. No hypermetabolic lymph nodes in the abdomen or pelvis. Incidental CT findings: Simple 1.2 cm posterior right liver dome cyst. Stable granulomatous liver calcifications. Atherosclerotic nonaneurysmal abdominal aorta. SKELETON: Low level FDG uptake with max SUV 2.6 associated with healed anterolateral right fifth rib fracture, decreased from previous max SUV 3.9. Mild hypermetabolism with max SUV 3.7 associated with healed posterior right sixth rib fracture, stable to slightly decreased from previous max SUV  4.0. Hypermetabolism at the left ninth costovertebral junction associated with both degenerative changes with osteophytes and lytic changes in the adjacent T9 vertebral body with max SUV 5.0, previous max SUV 4.6, not substantially changed. No new focal hypermetabolic osseous lesions. Incidental CT findings: Fine salt and pepper lytic changes throughout skeleton, unchanged on the CT images. EXTREMITIES: Stable low level FDG uptake associated with the mixed sclerotic and lytic proximal left humeral metaphysis lesion with max SUV 2.4, previous max SUV 2.6. Generalized hypermetabolism associated with the proximal and distal marrow spaces  of the long bones and of the bilateral pelvic girdle, most pronounced in the right humeral head with max SUV 5.0, previous max SUV 4.1, mildly increased, without appreciable changes on the CT images. No new focal hypermetabolic osseous or extraosseous lesions in the extremities. Incidental CT findings: none IMPRESSION: 1. Mild mixed metabolic changes. 2. Two persistent intensely hypermetabolic left neck lymph nodes, one mildly increased and one mildly decreased in metabolism. No new sites of extraosseous hypermetabolic metastatic disease. 3. Mildly increased generalized hypermetabolism associated with the proximal and distal marrow spaces of the long bones and of the bilateral pelvic girdle, most pronounced in the right humeral head, without appreciable changes on the CT images. Findings are nonspecific and could be due to reactive marrow changes such as due to recent treatment, although active myeloma cannot be excluded. 4. Decreased low level FDG uptake associated with healed right fifth and sixth rib fractures. 5. Stable hypermetabolism at the left ninth costovertebral junction. 6. No new hypermetabolic skeletal lesions. 7.  Aortic Atherosclerosis (ICD10-I70.0). Electronically Signed   By: Ilona Sorrel M.D.   On: 01/23/2023 16:12     ASSESSMENT & PLAN:   82 y.o. female with  1.  Nonsecretory multiple Myeloma, RISS Stage III  Labs upon initial presentation from 12/08/18, blood counts are normal including WBC at 7.1k, HGB at 13.1, and PLT at 245k. Calcium normal at 10.3. Creatinine normal at 0.63. M spike at 0.5g. 12/13/18 Bone Scan revealed Multifocal uptake throughout the skeleton, consistent with diffuse metastatic disease. Primary tumor is not specified. 2. Uptake in the proximal right femur, consistent with lytic lesions. 3. Uptake in the ribs bilaterally as described. 4. Lesions in the proximal left humerus. 5. Diffuse uptake throughout the skull consistent with metastatic disease. 6. Right  paramedian uptake at the manubrium.  12/13/18 CT Right Femur revealed Numerous lytic lesions involving the right femur and a lytic lesion in the left inferior pubic ramus. Overall appearance is most concerning for multiple myeloma  12/27/18 Pretreatment 24hour UPEP observed an M spike at 64m, and showed 1956mtotal protein/day.  12/27/18 Pretreatment MMP revealed M Protein at 0.5g with IgG Lambda specificity. Kappa:Lambda light chain ratio at 0.13, with Lambda at 40.3. There is less abnormal protein and light chains than I would expect from 30% plasma cells, which suggests hypo-secretory or non-secretory neoplastic plasma cells. Will have an impact in assessing response. 01/05/19 PET/CT revealed Innumerable lytic lesions in the skeleton compatible with myeloma. Most of the larger lesions are hypermetabolic, for example including a left proximal humeral shaft lesion with maximum SUV of 8.1 and a 2.8 cm lesion in the left T9 vertebral body with maximum SUV 5.1. Most of the smaller lytic lesions, and some of the larger lesions, do not demonstrate accentuated metabolic activity. 2. 1.2 cm in short axis lymph node in the left parapharyngeal space is hypermetabolic with maximum SUV 11.8. I do not see a separate mass in the head and  neck to give rise to this hypermetabolic lymph node. 3. Mosaic attenuation in the lower lobes, nonspecific possibly from air trapping. 4.  Aortic Atherosclerosis 5. Heterogeneous activity in the liver, making it hard to exclude small liver lesions. Consider hepatic protocol MRI with and without contrast for definitive assessment. Nonobstructive right nephrolithiasis. Old granulomatous disease  01/06/19 Bone Marrow biopsy revealed interstitial increase in plasma cells (28% aspirate, 40% CD138 immunohistochemistry). Plasma cells negative for light chains consistent with a non or weakly secretory myeloma   01/06/19 Cytogenetics revealed 37% of cells with trisomy 11 or 11q deletion, and  40.5% of cells with 17p mutation  S/p 5 cycles of KRD treatment  05/31/19 BM Biopsy revealed mild atypical plasmacytosis at 5% with polytypic variation.   06/01/19 PET/CT revealed "Dominant lesion in the LEFT humerus is decreased significantly in metabolic activity. Additional hypermetabolic skeletal lytic lesions have decreased in metabolic activity or similar to comparison exam (01/05/2019). No evidence of disease progression. 2. Multiple additional lytic lesions do not have metabolic activity and unchanged. 3. No new skeletal lesions are identified. No soft tissue plasmacytoma identified. 4. Nodule / node in the LEFT parapharyngeal space which is intensely hypermetabolic not changed from prior. 5. New hypermetabolic LEFT lower lobe pulmonary nodule is indeterminate. Recommend close attention on follow-up 6. New obstructive hydronephrosis of the RIGHT kidney related to RIGHT UPJ stone."  09/18/2019 BM Bx Report which revealed "Slightly hypercellular bone marrow for age with trilineage hematopoiesis and 1% plasma cells."  09/14/2019 PET/CT Whole Body Scan ([id number]) which revealed "1. There widespread tiny lytic lesions compatible with multiple myeloma. Index larger lesions are generally similar to the prior exam, with low-grade activity such as the left T9 vertebral body lesion with maximum SUV 4.5. Is mild increase in the activity associated with a mildly sclerotic left proximal humeral lesion, maximum SUV 4.8 (previously 3.5). 2. At the site of the prior left lower lobe nodule is currently more bandlike thickening, with maximum SUV only 1.9, probably benign, continued surveillance of this region suggested. 3. There several small but hypermetabolic lymph nodes. This includes a left parapharyngeal space node measuring 1.0 cm with maximum SUV 12.3 (stable); a left level IB lymph node measuring 0.5 cm with maximum SUV 4.8 (slightly larger than prior); and a left inguinal lymph node measuring 0.7 cm in  short axis with maximum SUV 6.4 (previously 0.5 cm with maximum SUV 0.6). Significance of these lymph nodes uncertain, surveillance is recommended. 4. New 5 mm left lower lobe subpleural nodule on image 32/8, not appreciably hypermetabolic, surveillance suggested. 5. Focal subcutaneous stranding along the left perineum measuring about 2.6 by 1.1 cm on image 221/4, maximum SUV 12.5. This was not present previously and is most likely inflammatory, although given the notable SUV, surveillance of this region is suggested. 6. Other imaging findings of potential clinical significance: Aortic Atherosclerosis (ICD10-I70.0). Coronary atherosclerosis. Old granulomatous disease. Mild right hydronephrosis due to a 7 mm right UPJ calculus. 2 mm right kidney upper pole nonobstructive renal calculus. Prominent stool throughout the colon favors constipation."  12/19/2019 Thoracic & Lumbar Spine MRI ([id number]) ([id number]) revealed "Suspected myeloma lesions at T9 and S1. No compression deformity or epidural disease.  01/18/2020 PET/CT ([id number]) which revealed "1. Stable lytic lesions throughout the skeleton. The larger lytic lesions which had mild metabolic activity on comparison exam now have background metabolic activity. No evidence of active myeloma. No evidence of progression multiple myeloma.  No plasmacytoma 3. Hypermetabolic nodules in the LEFT neck may be associated deep  tissues of the LEFT parotid gland. 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  01/17/19 MRI Liver revealed Several appreciable liver lesions all have benign imaging characteristics. No MRI findings of metastatic involvement of the liver. 2. Scattered bony lesions corresponding to the lytic lesions seen at PET-CT, compatible with active myeloma. 3.  Aortic Atherosclerosis.  Mild cardiomegaly. 4. Diffuse hepatic steatosis.   3. Left lower lobe pulmonary nodule First seen on 06/01/19 PET/CT PET/CT [id number]: No hypermetabolic mediastinal or hilar nodes. No suspicious pulmonary nodules on the CT scan.  4. 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 Being managed conservatively as per patient's preference.  No symptoms from this currently.  5. Mild chronic kidney disease CMP done 04/09/2022 showed creatinine of 1.17 and GFR est of 48 consistent with mild chronic kidney disease.  PLAN:  -Discussed lab results on 01/29/23 with patient. CBC showed WBC of 2.9 K, hemoglobin of 11.6, and platelets of 97 K.  -PET scan from 2/9 revealed small lymph nodes in neck area which have not changed in size since the last few years. No new bone lesions -Patient will receive a bone marrow biopsy February 16, 2023  FOLLOW UP: BM Bx as scheduled on 02/16/2023 -per integrated scheduling  The total time spent in the appointment was 25 minutes* .  All of the patient's questions were answered with apparent satisfaction. The patient knows to call the clinic with any problems, questions or concerns.   Sullivan Lone MD MS AAHIVMS [facility] Hematology/Oncology Physician [facility]  .*Total Encounter Time as defined by the Centers for Medicare and Medicaid Services includes, in addition to the face-to-face time of a patient visit (documented in the note above) non-face-to-face time: obtaining and reviewing outside history, ordering and reviewing medications, tests or procedures, care coordination (communications with other health care professionals or caregivers) and documentation in the medical record.   I,Mitra Faeizi,acting as a Education administrator for Sullivan Lone, MD.,have documented all relevant documentation on the behalf of Sullivan Lone, MD,as directed by  Sullivan Lone, MD while  in the presence of Sullivan Lone, MD.  .I have reviewed the above documentation for accuracy and completeness, and I agree with the above. Brunetta Genera MD

## 2023-01-29 NOTE — Progress Notes (Signed)
HEMATOLOGY/ONCOLOGY CLINIC NOTE  Date of Service: 01/29/23   Patient Care Team: Myrlene Broker, MD as PCP - General (Internal Medicine) Hart Carwin, MD (Inactive) as Consulting Physician (Gastroenterology) Jerene Bears, MD as Consulting Physician (Gynecology) Marcene Corning, MD as Consulting Physician (Orthopedic Surgery) Waymon Budge, MD as Consulting Physician (Pulmonary Disease) Mateo Flow, MD (Ophthalmology)  CHIEF COMPLAINTS/PURPOSE OF VISIT:  Follow-up for continued evaluation and management of multiple myeloma  HISTORY OF PRESENTING ILLNESS:  Please see previous notes for details on initial presentation  Current Treatment: Carfilzomib + Revlimid maintenance.  INTERVAL HISTORY:   Faith Orr is a 82 y.o. female who is here for continued evaluation and management of the patient's multiple myeloma.  Patient was last seen by me on 11/20/22 and reported manageable fatigue and controlled chronic back pain. She also reported a light appetite, which she attributed to chronic taste changes.  Today, she notes no acute new symptoms.  Acute new focal bone pains.  No fevers no chills no night sweats no unexpected weight loss.  No new infection issues. No new neuropathy symptoms. She notes that she had a quite a nice Christmas and New Year's.  discussed labs from 12/18/22 with patient.     MEDICAL HISTORY:  Past Medical History:  Diagnosis Date   Allergy    seasonal   Asthma    DEPRESSION    DIABETES MELLITUS, TYPE II    Diverticulosis    HYPERLIPIDEMIA    Macular degeneration of left eye    mild, Dr.Hecker   Obesity, unspecified    Osteoarthritis of both knees    OSTEOPENIA    Osteopenia    URINARY INCONTINENCE     SURGICAL HISTORY: Past Surgical History:  Procedure Laterality Date   CATARACT EXTRACTION Left 05/24/2018   CESAREAN SECTION  01/1973   CYSTOSCOPY/URETEROSCOPY/HOLMIUM LASER/STENT PLACEMENT Right 09/20/2019   Procedure:  CYSTOSCOPY/URETEROSCOPY/HOLMIUM LASER/STENT PLACEMENT;  Surgeon: [name], MD;  Location: [facility];  Service: Urology;  Laterality: Right;   FRACTURE SURGERY     IR IMAGING GUIDED PORT INSERTION  02/20/2019   left wrist surgery  2008   By Dr. Yisroel Ramming   right ankle  1994    SOCIAL HISTORY: Social History   Socioeconomic History   Marital status: Married    Spouse name: Not on file   Number of children: 1   Years of education: Not on file   Highest education level: Not on file  Occupational History    Employer: [employer]  Tobacco Use   Smoking status: Never   Smokeless tobacco: Never   Tobacco comments:    Lives with partner [name]) and son  Vaping Use   Vaping Use: Never used  Substance and Sexual Activity   Alcohol use: No    Alcohol/week: 0.0 standard drinks of alcohol   Drug use: No   Sexual activity: Never    Partners: Female    Birth control/protection: Post-menopausal    Comment: Lives with female partner (annette hicks) and 48 yo son  Other Topics Concern   Not on file  Social History Narrative   Not on file   Social Determinants of Health   Financial Resource Strain: Low Risk  (02/27/2022)   Overall Financial Resource Strain (CARDIA)    Difficulty of Paying Living Expenses: Not hard at all  Food Insecurity: No Food Insecurity (02/27/2022)   Hunger Vital Sign    Worried About Running Out of  Food in the Last Year: Never true    Ran Out of Food in the Last Year: Never true  Transportation Needs: No Transportation Needs (02/27/2022)   PRAPARE - Administrator, Civil Service (Medical): No    Lack of Transportation (Non-Medical): No  Physical Activity: Unknown (02/27/2022)   Exercise Vital Sign    Days of Exercise per Week: Patient refused    Minutes of Exercise per Session: Patient refused  Stress: No Stress Concern Present (02/27/2022)   [employer] of Occupational Health - Occupational Stress  Questionnaire    Feeling of Stress : Not at all  Social Connections: Unknown (02/27/2022)   Social Connection and Isolation Panel [NHANES]    Frequency of Communication with Friends and Family: Patient refused    Frequency of Social Gatherings with Friends and Family: Patient refused    Attends Religious Services: Patient refused    Active Member of Clubs or Organizations: Patient refused    Attends Banker Meetings: Patient refused    Marital Status: Living with partner  Intimate Partner Violence: Not At Risk (05/08/2021)   Humiliation, Afraid, Rape, and Kick questionnaire    Fear of Current or Ex-Partner: No    Emotionally Abused: No    Physically Abused: No    Sexually Abused: No    FAMILY HISTORY: Family History  Problem Relation Age of Onset   Diabetes Father    Hyperlipidemia Father    Heart disease Father    Cancer Father    Hypertension Father    Colon cancer Paternal Grandmother 66   Osteoporosis Mother    Protein S deficiency Mother    Hyperlipidemia Mother    Multiple sclerosis Daughter    Cancer Other        bladder   Breast cancer Neg Hx     ALLERGIES:  is allergic to levofloxacin, penicillins, aleve [naproxen sodium], and sulfonamide derivatives.  MEDICATIONS:  Current Outpatient Medications  Medication Sig Dispense Refill   acyclovir (ZOVIRAX) 400 MG tablet Take 1 tablet (400 mg total) by mouth 2 (two) times daily. 60 tablet 5   Blood Glucose Monitoring Suppl (FREESTYLE FREEDOM LITE) W/DEVICE KIT Use to check blood sugars twice a day Dx 250.00 1 each 0   Calcium Carbonate-Vitamin D 600-400 MG-UNIT tablet Take 1 tablet by mouth 2 (two) times daily.     dexamethasone (DECADRON) 4 MG tablet Take 5 tablets (20 mg total) by mouth once a week. On D22 of each cycle of treatment 20 tablet 5   ELIQUIS 2.5 MG TABS tablet TAKE 1 TABLET(2.5 MG) BY MOUTH TWICE DAILY 60 tablet 2   fentaNYL (DURAGESIC) 12 MCG/HR Place 1 patch onto the skin every 3 (three)  days. 10 patch 0   fluticasone (FLONASE) 50 MCG/ACT nasal spray Place 1 spray into both nostrils daily. 16 g 2   glucose blood (FREESTYLE LITE) test strip CHECK BLOOD SUGAR TWICE DAILY AS DIRECTED Dx 250.00 180 each 3   Lancets (FREESTYLE) lancets Use twice daily to check sugars. 100 each 11   lenalidomide (REVLIMID) 5 MG capsule TAKE 1 CAPSULE BY MOUTH DAILY  FOR 21 DAYS, THEN 7 DAYS OFF 21 capsule 0   lidocaine-prilocaine (EMLA) cream APPLY 1 APPLICATION TO THE AFFECTED AREA AS NEEDED. USE PRIOR TO PORT ACCESS 30 g 0   meclizine (ANTIVERT) 25 MG tablet Take 1 tablet (25 mg total) by mouth every 8 (eight) hours as needed for dizziness. 20 tablet 0   metFORMIN (GLUCOPHAGE-XR) 500  MG 24 hr tablet Take 3 tablets (1,500 mg total) by mouth daily. Annual appt due in Jan must see provider for future refills 180 tablet 0   Multiple Vitamins-Minerals (ICAPS) CAPS Take 1 capsule by mouth daily after breakfast.     ondansetron (ZOFRAN) 8 MG tablet Take 1 tablet (8 mg total) by mouth 2 (two) times daily as needed (Nausea or vomiting). 30 tablet 1   Oxycodone HCl 10 MG TABS Take 1 tablet (10 mg total) by mouth every 6 (six) hours as needed. 90 tablet 0   pantoprazole (PROTONIX) 20 MG tablet Take 1 tablet (20 mg total) by mouth daily. 30 tablet 2   polyethylene glycol (MIRALAX / GLYCOLAX) packet Take 17 g by mouth daily after breakfast.     potassium chloride SA (KLOR-CON M) 20 MEQ tablet TAKE 2 TABLETS BY MOUTH TWICE DAILY 360 tablet 1   prochlorperazine (COMPAZINE) 10 MG tablet Take 1 tablet (10 mg total) by mouth every 6 (six) hours as needed (Nausea or vomiting). 30 tablet 1   senna-docusate (SENNA S) 8.6-50 MG tablet Take 2 tablets by mouth at bedtime. 60 tablet 2   sertraline (ZOLOFT) 100 MG tablet Take 1 tablet (100 mg total) by mouth daily. 90 tablet 3   simvastatin (ZOCOR) 20 MG tablet TAKE 1 TABLET BY MOUTH EVERY DAY AT 6 PM Follow-up appt due must see provider for future refills 90 tablet 0    Vitamin D, Ergocalciferol, (DRISDOL) 1.25 MG (50000 UNIT) CAPS capsule TAKE 1 CAPSULE BY MOUTH EVERY 7 DAYS 12 capsule 0   No current facility-administered medications for this visit.   Facility-Administered Medications Ordered in Other Visits  Medication Dose Route Frequency Provider Last Rate Last Admin   heparin lock flush 100 unit/mL  500 Units Intracatheter Once PRN Johney Maine, MD       sodium chloride flush (NS) 0.9 % injection 10 mL  10 mL Intracatheter PRN Johney Maine, MD   10 mL at 10/02/19 1524   sodium chloride flush (NS) 0.9 % injection 10 mL  10 mL Intracatheter PRN Johney Maine, MD   10 mL at 12/04/22 1517    REVIEW OF SYSTEMS:   .10 Point review of Systems was done is negative except as noted above.  PHYSICAL EXAMINATION: ECOG FS:2 - Symptomatic, <50% confined to bed  There were no vitals filed for this visit.    Wt Readings from Last 3 Encounters:  01/15/23 149 lb 8 oz (67.8 kg)  01/01/23 150 lb 12 oz (68.4 kg)  12/18/22 151 lb 14.4 oz (68.9 kg)   There is no height or weight on file to calculate BMI.   Marland Kitchen GENERAL:alert, in no acute distress and comfortable SKIN: no acute rashes, no significant lesions EYES: conjunctiva are pink and non-injected, sclera anicteric OROPHARYNX: MMM, no exudates, no oropharyngeal erythema or ulceration NECK: supple, no JVD LYMPH:  no palpable lymphadenopathy in the cervical, axillary or inguinal regions LUNGS: clear to auscultation b/l with normal respiratory effort HEART: regular rate & rhythm ABDOMEN:  normoactive bowel sounds , non tender, not distended. Extremity: no pedal edema PSYCH: alert & oriented x 3 with fluent speech NEURO: no focal motor/sensory deficits       Latest Ref Rng & Units 01/15/2023   12:14 PM 01/01/2023   12:10 PM 12/18/2022   11:34 AM  CBC  WBC 4.0 - 10.5 K/uL 3.9  3.6  3.3   Hemoglobin 12.0 - 15.0 g/dL 16.1  09.6  04.5  Hematocrit 36.0 - 46.0 % 34.1  34.4  32.6    Platelets 150 - 400 K/uL 155  99  145       Latest Ref Rng & Units 01/15/2023   12:14 PM 01/01/2023   12:10 PM 12/18/2022   11:34 AM  CMP  Glucose 70 - 99 mg/dL 161  096  045   BUN 8 - 23 mg/dL 7  8  10    Creatinine 0.44 - 1.00 mg/dL 4.09  8.11  9.14   Sodium 135 - 145 mmol/L 137  137  137   Potassium 3.5 - 5.1 mmol/L 3.7  3.5  3.5   Chloride 98 - 111 mmol/L 111  111  112   CO2 22 - 32 mmol/L 18  18  17    Calcium 8.9 - 10.3 mg/dL 9.1  8.5  8.6   Total Protein 6.5 - 8.1 g/dL 6.0  5.8  5.6   Total Bilirubin 0.3 - 1.2 mg/dL 0.6  0.7  0.5   Alkaline Phos 38 - 126 U/L 101  102  102   AST 15 - 41 U/L 11  9  11    ALT 0 - 44 U/L 10  10  10       09/18/2019 BM Bx Report ([id number])   09/18/2019 FISH Panel    05/30/2019 BM Bx   01/06/2019 BM Bx:     01/06/19 Cytogenetics:      05/30/19 BM Biopsy:   09/18/2019 FISH Panel    09/18/2019 BM Surgical Pathology ([id number])     RADIOGRAPHIC STUDIES: I have personally reviewed the radiological images as listed and agreed with the findings in the report. NM PET Image Restage (PS) Whole Body  Result Date: 01/23/2023 CLINICAL DATA:  Subsequent treatment strategy for nonsecretory multiple myeloma. EXAM: NUCLEAR MEDICINE PET WHOLE BODY TECHNIQUE: 7.5 mCi F-18 FDG was injected intravenously. Full-ring PET imaging was performed from the head to foot after the radiotracer. CT data was obtained and used for attenuation correction and anatomic localization. Fasting blood glucose: 137 mg/dl COMPARISON:  78/29/5621 PET-CT. FINDINGS: Mediastinal blood pool activity: SUV max 2.9 HEAD/NECK: No hypermetabolic activity in the scalp. Hypermetabolic 1.0 cm high left lateral retropharyngeal lymph node with max SUV 8.8 (series 4/image 43), previously 1.0 cm with max SUV 10.1, mildly decreased in metabolism. Hypermetabolic 0.9 cm left level 2 neck lymph node with max SUV 18.9 (series 4/image 54), previously 0.9 cm with max SUV 16.9, mildly  increased in metabolism. No new pathologically enlarged or newly hypermetabolic cervical lymph nodes. Incidental CT findings: Right internal jugular Port-A-Cath terminates at the cavoatrial junction. CHEST: No enlarged or hypermetabolic axillary, mediastinal or hilar lymph nodes. No hypermetabolic pulmonary findings. Incidental CT findings: Coronary atherosclerosis. Atherosclerotic nonaneurysmal thoracic aorta. Stable granulomatous calcified left hilar lymph nodes and posterior left upper lobe calcified 1.1 cm granuloma. ABDOMEN/PELVIS: No abnormal hypermetabolic activity within the liver, pancreas, adrenal glands, or spleen. No hypermetabolic lymph nodes in the abdomen or pelvis. Incidental CT findings: Simple 1.2 cm posterior right liver dome cyst. Stable granulomatous liver calcifications. Atherosclerotic nonaneurysmal abdominal aorta. SKELETON: Low level FDG uptake with max SUV 2.6 associated with healed anterolateral right fifth rib fracture, decreased from previous max SUV 3.9. Mild hypermetabolism with max SUV 3.7 associated with healed posterior right sixth rib fracture, stable to slightly decreased from previous max SUV 4.0. Hypermetabolism at the left ninth costovertebral junction associated with both degenerative changes with osteophytes and lytic changes in the adjacent T9 vertebral body with max SUV 5.0,  previous max SUV 4.6, not substantially changed. No new focal hypermetabolic osseous lesions. Incidental CT findings: Fine salt and pepper lytic changes throughout skeleton, unchanged on the CT images. EXTREMITIES: Stable low level FDG uptake associated with the mixed sclerotic and lytic proximal left humeral metaphysis lesion with max SUV 2.4, previous max SUV 2.6. Generalized hypermetabolism associated with the proximal and distal marrow spaces of the long bones and of the bilateral pelvic girdle, most pronounced in the right humeral head with max SUV 5.0, previous max SUV 4.1, mildly increased,  without appreciable changes on the CT images. No new focal hypermetabolic osseous or extraosseous lesions in the extremities. Incidental CT findings: none IMPRESSION: 1. Mild mixed metabolic changes. 2. Two persistent intensely hypermetabolic left neck lymph nodes, one mildly increased and one mildly decreased in metabolism. No new sites of extraosseous hypermetabolic metastatic disease. 3. Mildly increased generalized hypermetabolism associated with the proximal and distal marrow spaces of the long bones and of the bilateral pelvic girdle, most pronounced in the right humeral head, without appreciable changes on the CT images. Findings are nonspecific and could be due to reactive marrow changes such as due to recent treatment, although active myeloma cannot be excluded. 4. Decreased low level FDG uptake associated with healed right fifth and sixth rib fractures. 5. Stable hypermetabolism at the left ninth costovertebral junction. 6. No new hypermetabolic skeletal lesions. 7.  Aortic Atherosclerosis (ICD10-I70.0). Electronically Signed   By: Delbert Phenix M.D.   On: 01/23/2023 16:12     ASSESSMENT & PLAN:   82 y.o. female with  1.  Nonsecretory multiple Myeloma, RISS Stage III  Labs upon initial presentation from 12/08/18, blood counts are normal including WBC at 7.1k, HGB at 13.1, and PLT at 245k. Calcium normal at 10.3. Creatinine normal at 0.63. M spike at 0.5g. 12/13/18 Bone Scan revealed Multifocal uptake throughout the skeleton, consistent with diffuse metastatic disease. Primary tumor is not specified. 2. Uptake in the proximal right femur, consistent with lytic lesions. 3. Uptake in the ribs bilaterally as described. 4. Lesions in the proximal left humerus. 5. Diffuse uptake throughout the skull consistent with metastatic disease. 6. Right paramedian uptake at the manubrium.  12/13/18 CT Right Femur revealed Numerous lytic lesions involving the right femur and a lytic lesion in the left inferior  pubic ramus. Overall appearance is most concerning for multiple myeloma  12/27/18 Pretreatment 24hour UPEP observed an M spike at 18mg , and showed 199mg  total protein/day.  12/27/18 Pretreatment MMP revealed M Protein at 0.5g with IgG Lambda specificity. Kappa:Lambda light chain ratio at 0.13, with Lambda at 40.3. There is less abnormal protein and light chains than I would expect from 30% plasma cells, which suggests hypo-secretory or non-secretory neoplastic plasma cells. Will have an impact in assessing response. 01/05/19 PET/CT revealed Innumerable lytic lesions in the skeleton compatible with myeloma. Most of the larger lesions are hypermetabolic, for example including a left proximal humeral shaft lesion with maximum SUV of 8.1 and a 2.8 cm lesion in the left T9 vertebral body with maximum SUV 5.1. Most of the smaller lytic lesions, and some of the larger lesions, do not demonstrate accentuated metabolic activity. 2. 1.2 cm in short axis lymph node in the left parapharyngeal space is hypermetabolic with maximum SUV 11.8. I do not see a separate mass in the head and neck to give rise to this hypermetabolic lymph node. 3. Mosaic attenuation in the lower lobes, nonspecific possibly from air trapping. 4.  Aortic Atherosclerosis 5. Heterogeneous activity  in the liver, making it hard to exclude small liver lesions. Consider hepatic protocol MRI with and without contrast for definitive assessment. Nonobstructive right nephrolithiasis. Old granulomatous disease  01/06/19 Bone Marrow biopsy revealed interstitial increase in plasma cells (28% aspirate, 40% CD138 immunohistochemistry). Plasma cells negative for light chains consistent with a non or weakly secretory myeloma   01/06/19 Cytogenetics revealed 37% of cells with trisomy 11 or 11q deletion, and 40.5% of cells with 17p mutation  S/p 5 cycles of KRD treatment  05/31/19 BM Biopsy revealed mild atypical plasmacytosis at 5% with polytypic variation.    06/01/19 PET/CT revealed "Dominant lesion in the LEFT humerus is decreased significantly in metabolic activity. Additional hypermetabolic skeletal lytic lesions have decreased in metabolic activity or similar to comparison exam (01/05/2019). No evidence of disease progression. 2. Multiple additional lytic lesions do not have metabolic activity and unchanged. 3. No new skeletal lesions are identified. No soft tissue plasmacytoma identified. 4. Nodule / node in the LEFT parapharyngeal space which is intensely hypermetabolic not changed from prior. 5. New hypermetabolic LEFT lower lobe pulmonary nodule is indeterminate. Recommend close attention on follow-up 6. New obstructive hydronephrosis of the RIGHT kidney related to RIGHT UPJ stone."  09/18/2019 BM Bx Report which revealed "Slightly hypercellular bone marrow for age with trilineage hematopoiesis and 1% plasma cells."  09/14/2019 PET/CT Whole Body Scan ([id number]) which revealed "1. There widespread tiny lytic lesions compatible with multiple myeloma. Index larger lesions are generally similar to the prior exam, with low-grade activity such as the left T9 vertebral body lesion with maximum SUV 4.5. Is mild increase in the activity associated with a mildly sclerotic left proximal humeral lesion, maximum SUV 4.8 (previously 3.5). 2. At the site of the prior left lower lobe nodule is currently more bandlike thickening, with maximum SUV only 1.9, probably benign, continued surveillance of this region suggested. 3. There several small but hypermetabolic lymph nodes. This includes a left parapharyngeal space node measuring 1.0 cm with maximum SUV 12.3 (stable); a left level IB lymph node measuring 0.5 cm with maximum SUV 4.8 (slightly larger than prior); and a left inguinal lymph node measuring 0.7 cm in short axis with maximum SUV 6.4 (previously 0.5 cm with maximum SUV 0.6). Significance of these lymph nodes uncertain, surveillance is recommended. 4. New 5 mm  left lower lobe subpleural nodule on image 32/8, not appreciably hypermetabolic, surveillance suggested. 5. Focal subcutaneous stranding along the left perineum measuring about 2.6 by 1.1 cm on image 221/4, maximum SUV 12.5. This was not present previously and is most likely inflammatory, although given the notable SUV, surveillance of this region is suggested. 6. Other imaging findings of potential clinical significance: Aortic Atherosclerosis (ICD10-I70.0). Coronary atherosclerosis. Old granulomatous disease. Mild right hydronephrosis due to a 7 mm right UPJ calculus. 2 mm right kidney upper pole nonobstructive renal calculus. Prominent stool throughout the colon favors constipation."  12/19/2019 Thoracic & Lumbar Spine MRI ([id number]) ([id number]) revealed "Suspected myeloma lesions at T9 and S1. No compression deformity or epidural disease.  01/18/2020 PET/CT ([id number]) which revealed "1. Stable lytic lesions throughout the skeleton. The larger lytic lesions which had mild metabolic activity on comparison exam now have background metabolic activity. No evidence of active myeloma. No evidence of progression multiple myeloma.  No plasmacytoma 3. Hypermetabolic nodules in the LEFT neck may be associated deep tissues of the LEFT parotid gland. 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 Heterogeneous liver activity, as seen on 01/05/19  PET/CT Extra-medullary hematopoiesis vs metabolic liver disease vs hepatic malignancy ?  01/17/19 MRI Liver revealed Several appreciable liver lesions all have benign imaging characteristics. No MRI findings of metastatic involvement of the liver. 2. Scattered bony lesions corresponding to the lytic lesions seen at PET-CT, compatible with active myeloma. 3. Aortic Atherosclerosis.  Mild cardiomegaly. 4. Diffuse hepatic steatosis.   3. Left lower lobe pulmonary nodule First seen on 06/01/19 PET/CT PET/CT 08/27/2020:  No hypermetabolic mediastinal or hilar nodes. No suspicious pulmonary nodules on the CT scan.  4. 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 Being managed conservatively as per patient's preference.  No symptoms from this currently.  5. Mild chronic kidney disease CMP done 04/09/2022 showed creatinine of 1.17 and GFR est of 48 consistent with mild chronic kidney disease.  PLAN: -Labs done today on 12/18/2022 were discussed with the patient in detail CBC shows mild anemia with a hemoglobin of 11.1 and mild leukopenia with a WBC count 3.3k and platelets of 145k -CMP is stable. -Multiple myeloma labs and light chains show no overt evidence of disease progression however the patient has nonsecretory myeloma. -Continue maintenance Revlimid at 5 mg p.o. daily 3 weeks on 1 week off -Continue carfilzomib 36 mg/m every 2 weeks for maintenance with the same supportive medications. -Continue Eliquis 2.5 mg p.o. twice daily for VTE prophylaxis -Continue Zometa every 4 weeks for bone directed therapy. -We discussed getting bone marrow biopsy and repeat PET CT scan to monitor her disease status given she has nonsecretory myeloma. -Treatment orders were reviewed and placed - FOLLOW UP: CT guided Bone marrow aspiration and biopsy in 2-3 weeks PET/CT in 2-3 weeks Follow-up as per integrated scheduling MD visit in 4 weeks   The total time spent in the appointment was 31 minutes* .  All of the patient's questions were answered with apparent satisfaction. The patient knows to call the clinic with any problems, questions or concerns.   Wyvonnia Lora MD MS AAHIVMS [facility] Hematology/Oncology Physician [facility]  .*Total Encounter Time as defined by the Centers for Medicare and Medicaid Services includes, in addition to the face-to-face time of a patient visit (documented in the note above)  non-face-to-face time: obtaining and reviewing outside history, ordering and reviewing medications, tests or procedures, care coordination (communications with other health care professionals or caregivers) and documentation in the medical record.   I,Mitra Faeizi,acting as a Neurosurgeon for Wyvonnia Lora, MD.,have documented all relevant documentation on the behalf of Wyvonnia Lora, MD,as directed by  Wyvonnia Lora, MD while in the presence of Wyvonnia Lora, MD.  .I have reviewed the above documentation for accuracy and completeness, and I agree with the above. Johney Maine MD

## 2023-02-02 ENCOUNTER — Other Ambulatory Visit: Payer: Self-pay

## 2023-02-03 ENCOUNTER — Encounter: Payer: Self-pay | Admitting: Hematology

## 2023-02-03 ENCOUNTER — Telehealth: Payer: Self-pay | Admitting: Pharmacy Technician

## 2023-02-03 NOTE — Telephone Encounter (Signed)
Oral Oncology Patient Advocate Encounter  Was successful in securing patient a $12,000 grant from [facility] to provide copayment coverage for lenalidomide.  This will keep the out of pocket expense at $0.     [employer] ID: [id number]  I have spoken with the patient.   The billing information is as follows and has been shared with [facility].    RxBin: [id number] PCN: [id number] Member ID: [id number] Group ID: [id number] Dates of Eligibility: 01/04/23 through 01/04/24  Fund:  [place], CPhT-Adv Oncology Pharmacy Patient [place] Direct Number: [phone]  Fax: [phone]

## 2023-02-04 ENCOUNTER — Encounter: Payer: Self-pay | Admitting: Hematology

## 2023-02-09 ENCOUNTER — Ambulatory Visit (HOSPITAL_COMMUNITY): Payer: Medicare PPO

## 2023-02-11 ENCOUNTER — Other Ambulatory Visit: Payer: Self-pay

## 2023-02-11 DIAGNOSIS — C9000 Multiple myeloma not having achieved remission: Secondary | ICD-10-CM

## 2023-02-12 ENCOUNTER — Inpatient Hospital Stay: Payer: Medicare PPO | Attending: Hematology

## 2023-02-12 ENCOUNTER — Other Ambulatory Visit: Payer: Self-pay

## 2023-02-12 ENCOUNTER — Inpatient Hospital Stay: Payer: Medicare PPO

## 2023-02-12 ENCOUNTER — Inpatient Hospital Stay: Payer: Medicare PPO | Admitting: Hematology

## 2023-02-12 DIAGNOSIS — Z7189 Other specified counseling: Secondary | ICD-10-CM

## 2023-02-12 DIAGNOSIS — C9000 Multiple myeloma not having achieved remission: Secondary | ICD-10-CM

## 2023-02-12 DIAGNOSIS — Z5112 Encounter for antineoplastic immunotherapy: Secondary | ICD-10-CM | POA: Diagnosis not present

## 2023-02-12 DIAGNOSIS — Z79899 Other long term (current) drug therapy: Secondary | ICD-10-CM | POA: Diagnosis not present

## 2023-02-12 DIAGNOSIS — Z95828 Presence of other vascular implants and grafts: Secondary | ICD-10-CM

## 2023-02-12 LAB — CMP (CANCER CENTER ONLY)
ALT: 13 U/L (ref 0–44)
AST: 20 U/L (ref 15–41)
Albumin: 3.6 g/dL (ref 3.5–5.0)
Alkaline Phosphatase: 98 U/L (ref 38–126)
Anion gap: 7 (ref 5–15)
BUN: 11 mg/dL (ref 8–23)
CO2: 20 mmol/L — ABNORMAL LOW (ref 22–32)
Calcium: 8.6 mg/dL — ABNORMAL LOW (ref 8.9–10.3)
Chloride: 111 mmol/L (ref 98–111)
Creatinine: 0.95 mg/dL (ref 0.44–1.00)
GFR, Estimated: >60 mL/min (ref >60)
Glucose, Bld: 161 mg/dL — ABNORMAL HIGH (ref 70–99)
Potassium: 3.4 mmol/L — ABNORMAL LOW (ref 3.5–5.1)
Sodium: 138 mmol/L (ref 135–145)
Total Bilirubin: 0.8 mg/dL (ref 0.3–1.2)
Total Protein: 6.4 g/dL — ABNORMAL LOW (ref 6.5–8.1)

## 2023-02-12 LAB — CBC WITH DIFFERENTIAL (CANCER CENTER ONLY)
Abs Immature Granulocytes: 0.01 10*3/uL (ref 0.00–0.07)
Basophils Absolute: 0 10*3/uL (ref 0.0–0.1)
Basophils Relative: 1 %
Eosinophils Absolute: 0.1 10*3/uL (ref 0.0–0.5)
Eosinophils Relative: 2 %
HCT: 33.7 % — ABNORMAL LOW (ref 36.0–46.0)
Hemoglobin: 11.2 g/dL — ABNORMAL LOW (ref 12.0–15.0)
Immature Granulocytes: 0 %
Lymphocytes Relative: 20 %
Lymphs Abs: 0.8 10*3/uL (ref 0.7–4.0)
MCH: 34.9 pg — ABNORMAL HIGH (ref 26.0–34.0)
MCHC: 33.2 g/dL (ref 30.0–36.0)
MCV: 105 fL — ABNORMAL HIGH (ref 80.0–100.0)
Monocytes Absolute: 0.5 10*3/uL (ref 0.1–1.0)
Monocytes Relative: 12 %
Neutro Abs: 2.5 10*3/uL (ref 1.7–7.7)
Neutrophils Relative %: 65 %
Platelet Count: 139 10*3/uL — ABNORMAL LOW (ref 150–400)
RBC: 3.21 MIL/uL — ABNORMAL LOW (ref 3.87–5.11)
RDW: 14.9 % (ref 11.5–15.5)
WBC Count: 3.8 10*3/uL — ABNORMAL LOW (ref 4.0–10.5)
nRBC: 0 % (ref 0.0–0.2)

## 2023-02-12 MED ORDER — DIPHENHYDRAMINE HCL 25 MG PO CAPS
25.0000 mg | ORAL_CAPSULE | Freq: Once | ORAL | Status: AC
  Administered 2023-02-12: 25 mg via ORAL
  Filled 2023-02-12: qty 1

## 2023-02-12 MED ORDER — SODIUM CHLORIDE 0.9% FLUSH
10.0000 mL | INTRAVENOUS | Status: DC | PRN
  Administered 2023-02-12: 10 mL

## 2023-02-12 MED ORDER — ACETAMINOPHEN 325 MG PO TABS
650.0000 mg | ORAL_TABLET | Freq: Once | ORAL | Status: AC
  Administered 2023-02-12: 650 mg via ORAL
  Filled 2023-02-12: qty 2

## 2023-02-12 MED ORDER — PROCHLORPERAZINE MALEATE 10 MG PO TABS
10.0000 mg | ORAL_TABLET | Freq: Once | ORAL | Status: AC
  Administered 2023-02-12: 10 mg via ORAL
  Filled 2023-02-12: qty 1

## 2023-02-12 MED ORDER — SODIUM CHLORIDE 0.9 % IV SOLN: Freq: Once | INTRAVENOUS | Status: AC

## 2023-02-12 MED ORDER — SODIUM CHLORIDE 0.9% FLUSH
10.0000 mL | Freq: Once | INTRAVENOUS | Status: AC
  Administered 2023-02-12: 10 mL

## 2023-02-12 MED ORDER — HEPARIN SOD (PORK) LOCK FLUSH 100 UNIT/ML IV SOLN
500.0000 [IU] | Freq: Once | INTRAVENOUS | Status: AC | PRN
  Administered 2023-02-12: 500 [IU]

## 2023-02-12 MED ORDER — FAMOTIDINE 20 MG PO TABS
20.0000 mg | ORAL_TABLET | Freq: Once | ORAL | Status: AC
  Administered 2023-02-12: 20 mg via ORAL
  Filled 2023-02-12: qty 1

## 2023-02-12 MED ORDER — DEXTROSE 5 % IV SOLN
36.0000 mg/m2 | Freq: Once | INTRAVENOUS | Status: AC
  Administered 2023-02-12: 60 mg via INTRAVENOUS
  Filled 2023-02-12: qty 30

## 2023-02-12 MED ORDER — DEXAMETHASONE 4 MG PO TABS
12.0000 mg | ORAL_TABLET | Freq: Once | ORAL | Status: AC
  Administered 2023-02-12: 12 mg via ORAL
  Filled 2023-02-12: qty 3

## 2023-02-12 NOTE — Progress Notes (Signed)
Ok to proceed without CMP results today per Dr Irene Limbo.

## 2023-02-12 NOTE — Patient Instructions (Signed)
[facility]  Discharge Instructions: Thank you for choosing [facility] to provide your oncology and hematology care.   If you have a lab appointment with the [facility], please go directly to the [facility] and check in at the registration area.   Wear comfortable clothing and clothing appropriate for easy access to any Portacath or PICC line.   We strive to give you quality time with your provider. You may need to reschedule your appointment if you arrive late (15 or more minutes).  Arriving late affects you and other patients whose appointments are after yours.  Also, if you miss three or more appointments without notifying the office, you may be dismissed from the clinic at the provider's discretion.      For prescription refill requests, have your pharmacy contact our office and allow 72 hours for refills to be completed.    Today you received the following chemotherapy and/or immunotherapy agents Kyprolis      To help prevent nausea and vomiting after your treatment, we encourage you to take your nausea medication as directed.  BELOW ARE SYMPTOMS THAT SHOULD BE REPORTED IMMEDIATELY: *FEVER GREATER THAN 100.4 F (38 C) OR HIGHER *CHILLS OR SWEATING *NAUSEA AND VOMITING THAT IS NOT CONTROLLED WITH YOUR NAUSEA MEDICATION *UNUSUAL SHORTNESS OF BREATH *UNUSUAL BRUISING OR BLEEDING *URINARY PROBLEMS (pain or burning when urinating, or frequent urination) *BOWEL PROBLEMS (unusual diarrhea, constipation, pain near the anus) TENDERNESS IN MOUTH AND THROAT WITH OR WITHOUT PRESENCE OF ULCERS (sore throat, sores in mouth, or a toothache) UNUSUAL RASH, SWELLING OR PAIN  UNUSUAL VAGINAL DISCHARGE OR ITCHING   Items with * indicate a potential emergency and should be followed up as soon as possible or go to the Emergency Department if any problems should occur.  Please show the CHEMOTHERAPY ALERT CARD or IMMUNOTHERAPY ALERT CARD at  check-in to the Emergency Department and triage nurse.  Should you have questions after your visit or need to cancel or reschedule your appointment, please contact [facility]  Dept: [phone]  and follow the prompts.  Office hours are 8:00 a.m. to 4:30 p.m. Monday - Friday. Please note that voicemails left after 4:00 p.m. may not be returned until the following business day.  We are closed weekends and major holidays. You have access to a nurse at all times for urgent questions. Please call the main number to the clinic Dept: [phone] and follow the prompts.   For any non-urgent questions, you may also contact your provider using MyChart. We now offer e-Visits for anyone 38 and older to request care online for non-urgent symptoms. For details visit [email or web address].   Also download the MyChart app! Go to the app store, search "MyChart", open the app, select [facility], and log in with your MyChart username and password.

## 2023-02-15 ENCOUNTER — Other Ambulatory Visit: Payer: Self-pay | Admitting: Internal Medicine

## 2023-02-15 ENCOUNTER — Other Ambulatory Visit: Payer: Self-pay

## 2023-02-15 ENCOUNTER — Other Ambulatory Visit: Payer: Self-pay | Admitting: Hematology

## 2023-02-15 ENCOUNTER — Encounter: Payer: Self-pay | Admitting: Hematology

## 2023-02-15 DIAGNOSIS — C9000 Multiple myeloma not having achieved remission: Secondary | ICD-10-CM

## 2023-02-15 DIAGNOSIS — C7951 Secondary malignant neoplasm of bone: Secondary | ICD-10-CM

## 2023-02-15 LAB — KAPPA/LAMBDA LIGHT CHAINS
Kappa free light chain: 19.5 mg/L — ABNORMAL HIGH (ref 3.3–19.4)
Kappa, lambda light chain ratio: 1.23 (ref 0.26–1.65)
Lambda free light chains: 15.9 mg/L (ref 5.7–26.3)

## 2023-02-15 MED ORDER — FENTANYL 12 MCG/HR TD PT72: 1.0000 | MEDICATED_PATCH | TRANSDERMAL | 0 refills | Status: DC

## 2023-02-15 NOTE — H&P (Signed)
Referring Physician(s): Brunetta Genera  Supervising Physician: Sandi Mariscal  Patient Status:  [facility] OP  Chief Complaint: "I'm here for a bone marrow biopsy"   Subjective: Patient known to IR service from bone marrow biopsies x 3 in 2020 and x 2 in 2022 as well as Port-A-Cath placement in 2020.  She has a history of multiple myeloma and presents today for image guided bone marrow biopsy to assess treatment response.  Past medical history also significant for asthma, depression, diabetes, diverticulosis, hyperlipidemia, macular degeneration left eye, obesity, osteoarthritis.    Past Medical History:  Diagnosis Date   Allergy    seasonal   Asthma    DEPRESSION    DIABETES MELLITUS, TYPE II    Diverticulosis    HYPERLIPIDEMIA    Macular degeneration of left eye    mild, Dr.Hecker   Obesity, unspecified    Osteoarthritis of both knees    OSTEOPENIA    Osteopenia    URINARY INCONTINENCE    Past Surgical History:  Procedure Laterality Date   CATARACT EXTRACTION Left 05/24/2018   CESAREAN SECTION  01/1973   CYSTOSCOPY/URETEROSCOPY/HOLMIUM LASER/STENT PLACEMENT Right 09/20/2019   Procedure: CYSTOSCOPY/URETEROSCOPY/HOLMIUM LASER/STENT PLACEMENT;  Surgeon: Lucas Mallow, MD;  Location: [facility];  Service: Urology;  Laterality: Right;   FRACTURE SURGERY     IR IMAGING GUIDED PORT INSERTION  02/20/2019   left wrist surgery  2008   By Dr. Latanya Maudlin   right ankle  1994      Allergies: Levofloxacin, Penicillins, Aleve [naproxen sodium], and Sulfonamide derivatives  Medications: Prior to Admission medications   Medication Sig Start Date End Date Taking? Authorizing Provider  acyclovir (ZOVIRAX) 400 MG tablet Take 1 tablet (400 mg total) by mouth 2 (two) times daily. 07/17/22   Brunetta Genera, MD  Blood Glucose Monitoring Suppl (FREESTYLE FREEDOM LITE) W/DEVICE KIT Use to check blood sugars twice a day Dx 250.00 06/01/14   Rowe Clack, MD   Calcium Carbonate-Vitamin D 600-400 MG-UNIT tablet Take 1 tablet by mouth 2 (two) times daily.    [provider]  dexamethasone (DECADRON) 4 MG tablet Take 5 tablets (20 mg total) by mouth once a week. On D22 of each cycle of treatment 03/09/19   Brunetta Genera, MD  ELIQUIS 2.5 MG TABS tablet TAKE 1 TABLET(2.5 MG) BY MOUTH TWICE DAILY 02/15/23   Brunetta Genera, MD  fentaNYL (DURAGESIC) 12 MCG/HR Place 1 patch onto the skin every 3 (three) days. 11/23/22   Brunetta Genera, MD  fluticasone (FLONASE) 50 MCG/ACT nasal spray Place 1 spray into both nostrils daily. 12/11/20   Hoyt Koch, MD  glucose blood (FREESTYLE LITE) test strip CHECK BLOOD SUGAR TWICE DAILY AS DIRECTED Dx 250.00 07/13/14   Rowe Clack, MD  Lancets (FREESTYLE) lancets Use twice daily to check sugars. 04/15/16   Hoyt Koch, MD  lenalidomide (REVLIMID) 5 MG capsule TAKE 1 CAPSULE BY MOUTH DAILY  FOR 21 DAYS, THEN 7 DAYS OFF 01/28/23   Brunetta Genera, MD  lidocaine-prilocaine (EMLA) cream APPLY 1 APPLICATION TO THE AFFECTED AREA AS NEEDED. USE PRIOR TO PORT ACCESS 09/19/21   Brunetta Genera, MD  meclizine (ANTIVERT) 25 MG tablet Take 1 tablet (25 mg total) by mouth every 8 (eight) hours as needed for dizziness. 07/20/22   Brunetta Genera, MD  metFORMIN (GLUCOPHAGE-XR) 500 MG 24 hr tablet Take 3 tablets (1,500 mg total) by mouth daily. Annual appt due in Jan  must see provider for future refills 08/18/22   Hoyt Koch, MD  Multiple Vitamins-Minerals (ICAPS) CAPS Take 1 capsule by mouth daily after breakfast.    [provider]  ondansetron (ZOFRAN) 8 MG tablet Take 1 tablet (8 mg total) by mouth 2 (two) times daily as needed (Nausea or vomiting). 09/11/22   Lincoln Brigham, PA-C  Oxycodone HCl 10 MG TABS Take 1 tablet (10 mg total) by mouth every 6 (six) hours as needed. 01/29/23   Brunetta Genera, MD  pantoprazole (PROTONIX) 20 MG tablet Take 1 tablet (20  mg total) by mouth daily. 12/22/22   Brunetta Genera, MD  polyethylene glycol (MIRALAX / Floria Raveling) packet Take 17 g by mouth daily after breakfast.    [provider]  potassium chloride SA (KLOR-CON M) 20 MEQ tablet TAKE 2 TABLETS BY MOUTH TWICE DAILY 10/07/22   Brunetta Genera, MD  prochlorperazine (COMPAZINE) 10 MG tablet Take 1 tablet (10 mg total) by mouth every 6 (six) hours as needed (Nausea or vomiting). 01/16/19   Brunetta Genera, MD  senna-docusate (SENNA S) 8.6-50 MG tablet Take 2 tablets by mouth at bedtime. 03/26/20   Brunetta Genera, MD  sertraline (ZOLOFT) 100 MG tablet Take 1 tablet (100 mg total) by mouth daily. 08/13/22   Hoyt Koch, MD  simvastatin (ZOCOR) 20 MG tablet TAKE 1 TABLET BY MOUTH EVERY DAY AT 6 PM Follow-up appt due must see provider for future refills 07/24/22   Hoyt Koch, MD  Vitamin D, Ergocalciferol, (DRISDOL) 1.25 MG (50000 UNIT) CAPS capsule TAKE 1 CAPSULE BY MOUTH EVERY 7 DAYS 02/15/23   Brunetta Genera, MD     Vital Signs: LMP 10/09/2012      Code Status:   Physical Exam  Imaging: No results found.  Labs:  CBC: Recent Labs    01/01/23 1210 01/15/23 1214 01/29/23 1313 02/12/23 1220  WBC 3.6* 3.9* 2.9* 3.8*  HGB 11.7* 11.8* 11.6* 11.2*  HCT 34.4* 34.1* 33.8* 33.7*  PLT 99* 155 97* 139*    COAGS: No results for input(s): "INR", "APTT" in the last 8760 hours.  BMP: Recent Labs    01/01/23 1210 01/15/23 1214 01/29/23 1313 02/12/23 1220  NA 137 137 138 138  K 3.5 3.7 3.6 3.4*  CL 111 111 113* 111  CO2 18* 18* 17* 20*  GLUCOSE 200* 180* 183* 161*  BUN 8 7* 9 11  CALCIUM 8.5* 9.1 8.0* 8.6*  CREATININE 1.03* 0.94 0.99 0.95  GFRNONAA 55* >60 58* >60    LIVER FUNCTION TESTS: Recent Labs    01/01/23 1210 01/15/23 1214 01/29/23 1313 02/12/23 1220  BILITOT 0.7 0.6 0.7 0.8  AST 9* 11* 10* 20  ALT '10 10 11 13  '$ ALKPHOS 102 101 99 98  PROT 5.8* 6.0* 6.1* 6.4*  ALBUMIN 3.7 3.7  3.8 3.6    Assessment and Plan: Patient known to IR service from bone marrow biopsies x 3 in 2020 and x 2 in 2022 as well as Port-A-Cath placement in 2020.  She has a history of multiple myeloma and presents today for image guided bone marrow biopsy to assess treatment response.  Past medical history also significant for asthma, depression, diabetes, diverticulosis, hyperlipidemia, macular degeneration left eye, obesity, osteoarthritis.Risks and benefits of procedure was discussed with the patient  including, but not limited to bleeding, infection, damage to adjacent structures or low yield requiring additional tests.  All of the questions were answered and there is agreement  to proceed.  Consent signed and in chart.    Electronically Signed: D. Rowe Robert, PA-C 02/15/2023, 10:55 AM   I spent a total of 20 minutes at the the patient's bedside AND on the patient's hospital floor or unit, greater than 50% of which was counseling/coordinating care for image guided bone marrow biopsy

## 2023-02-16 ENCOUNTER — Ambulatory Visit (HOSPITAL_COMMUNITY)
Admission: RE | Admit: 2023-02-16 | Discharge: 2023-02-16 | Disposition: A | Discharge status: Home / Self Care | Payer: Medicare PPO | Source: Ambulatory Visit | Attending: Hematology | Admitting: Hematology

## 2023-02-16 ENCOUNTER — Encounter (HOSPITAL_COMMUNITY): Payer: Self-pay

## 2023-02-16 ENCOUNTER — Other Ambulatory Visit: Payer: Self-pay

## 2023-02-16 DIAGNOSIS — Z5111 Encounter for antineoplastic chemotherapy: Secondary | ICD-10-CM

## 2023-02-16 DIAGNOSIS — C9000 Multiple myeloma not having achieved remission: Secondary | ICD-10-CM | POA: Insufficient documentation

## 2023-02-16 DIAGNOSIS — M899 Disorder of bone, unspecified: Secondary | ICD-10-CM | POA: Diagnosis not present

## 2023-02-16 DIAGNOSIS — Z452 Encounter for adjustment and management of vascular access device: Secondary | ICD-10-CM | POA: Diagnosis not present

## 2023-02-16 DIAGNOSIS — Z1379 Encounter for other screening for genetic and chromosomal anomalies: Secondary | ICD-10-CM | POA: Diagnosis not present

## 2023-02-16 DIAGNOSIS — D61818 Other pancytopenia: Secondary | ICD-10-CM | POA: Diagnosis not present

## 2023-02-16 HISTORY — PX: IR BONE MARROW BIOPSY & ASPIRATION: IMG5727

## 2023-02-16 LAB — CBC WITH DIFFERENTIAL/PLATELET
Abs Immature Granulocytes: 0.01 10*3/uL (ref 0.00–0.07)
Basophils Absolute: 0 10*3/uL (ref 0.0–0.1)
Basophils Relative: 0 %
Eosinophils Absolute: 0.1 10*3/uL (ref 0.0–0.5)
Eosinophils Relative: 2 %
HCT: 34.4 % — ABNORMAL LOW (ref 36.0–46.0)
Hemoglobin: 10.9 g/dL — ABNORMAL LOW (ref 12.0–15.0)
Immature Granulocytes: 0 %
Lymphocytes Relative: 26 %
Lymphs Abs: 0.9 10*3/uL (ref 0.7–4.0)
MCH: 34.3 pg — ABNORMAL HIGH (ref 26.0–34.0)
MCHC: 31.7 g/dL (ref 30.0–36.0)
MCV: 108.2 fL — ABNORMAL HIGH (ref 80.0–100.0)
Monocytes Absolute: 0.5 10*3/uL (ref 0.1–1.0)
Monocytes Relative: 15 %
Neutro Abs: 2 10*3/uL (ref 1.7–7.7)
Neutrophils Relative %: 57 %
Platelets: 91 10*3/uL — ABNORMAL LOW (ref 150–400)
RBC: 3.18 MIL/uL — ABNORMAL LOW (ref 3.87–5.11)
RDW: 14.1 % (ref 11.5–15.5)
WBC: 3.6 10*3/uL — ABNORMAL LOW (ref 4.0–10.5)
nRBC: 0.6 % — ABNORMAL HIGH (ref 0.0–0.2)

## 2023-02-16 LAB — GLUCOSE, CAPILLARY: Glucose-Capillary: 132 mg/dL — ABNORMAL HIGH (ref 70–99)

## 2023-02-16 MED ORDER — MIDAZOLAM HCL 2 MG/2ML IJ SOLN
INTRAMUSCULAR | Status: AC
  Filled 2023-02-16: qty 2

## 2023-02-16 MED ORDER — LIDOCAINE HCL (PF) 1 % IJ SOLN
INTRAMUSCULAR | Status: AC | PRN
  Administered 2023-02-16: 20 mL

## 2023-02-16 MED ORDER — LIDOCAINE HCL (PF) 1 % IJ SOLN
INTRAMUSCULAR | Status: AC
  Filled 2023-02-16: qty 30

## 2023-02-16 MED ORDER — MIDAZOLAM HCL 2 MG/2ML IJ SOLN
INTRAMUSCULAR | Status: AC | PRN
  Administered 2023-02-16 (×2): 1 mg via INTRAVENOUS

## 2023-02-16 MED ORDER — FENTANYL CITRATE (PF) 100 MCG/2ML IJ SOLN
INTRAMUSCULAR | Status: AC
  Filled 2023-02-16: qty 2

## 2023-02-16 MED ORDER — FENTANYL CITRATE (PF) 100 MCG/2ML IJ SOLN
INTRAMUSCULAR | Status: AC | PRN
  Administered 2023-02-16 (×2): 50 ug via INTRAVENOUS

## 2023-02-16 MED ORDER — HEPARIN SOD (PORK) LOCK FLUSH 100 UNIT/ML IV SOLN
500.0000 [IU] | INTRAVENOUS | Status: AC | PRN
  Administered 2023-02-16: 500 [IU]
  Filled 2023-02-16: qty 5

## 2023-02-16 MED ORDER — SODIUM CHLORIDE 0.9 % IV SOLN: INTRAVENOUS | Status: DC

## 2023-02-16 NOTE — Procedures (Signed)
Pre-procedure Diagnosis: Multiple Myeloma Post-procedure Diagnosis: Same  Technically successful image guided bone marrow aspiration and biopsy of right iliac crest.   Complications: Trace EBL: None  Signed: Sandi Mariscal Pager: [phone] 02/16/2023, 10:58 AM

## 2023-02-16 NOTE — Discharge Instructions (Addendum)
Please call Interventional Radiology clinic [phone] with any questions or concerns.  You may remove your dressing and shower tomorrow. Replace with clean bandaid as needed.  Do not submerge in water until wound has scab.   Bone Marrow Aspiration and Bone Marrow Biopsy, Adult, Care After This sheet gives you information about how to care for yourself after your procedure. Your health care provider may also give you more specific instructions. If you have problems or questions, contact your health care provider. What can I expect after the procedure? After the procedure, it is common to have: Mild pain and tenderness. Swelling. Bruising. Follow these instructions at home: Puncture site care Follow instructions from your health care provider about how to take care of the puncture site. Make sure you: Wash your hands with soap and water before and after you change your bandage (dressing). If soap and water are not available, use hand sanitizer. Change your dressing as told by your health care provider. Check your puncture site every day for signs of infection. Check for: More redness, swelling, or pain. Fluid or blood. Warmth. Pus or a bad smell.   Activity Return to your normal activities as told by your health care provider. Ask your health care provider what activities are safe for you. Do not lift anything that is heavier than 10 lb (4.5 kg), or the limit that you are told, until your health care provider says that it is safe. Do not drive for 24 hours if you were given a sedative during your procedure. General instructions Take over-the-counter and prescription medicines only as told by your health care provider. Do not take baths, swim, or use a hot tub until your health care provider approves. Ask your health care provider if you may take showers. You may only be allowed to take sponge baths. If directed, put ice on the affected area. To do this: Put ice in a plastic bag. Place  a towel between your skin and the bag. Leave the ice on for 20 minutes, 2-3 times a day. Keep all follow-up visits as told by your health care provider. This is important.   Contact a health care provider if: Your pain is not controlled with medicine. You have a fever. You have more redness, swelling, or pain around the puncture site. You have fluid or blood coming from the puncture site. Your puncture site feels warm to the touch. You have pus or a bad smell coming from the puncture site. Summary After the procedure, it is common to have mild pain, tenderness, swelling, and bruising. Follow instructions from your health care provider about how to take care of the puncture site and what activities are safe for you. Take over-the-counter and prescription medicines only as told by your health care provider. Contact a health care provider if you have any signs of infection, such as fluid or blood coming from the puncture site. This information is not intended to replace advice given to you by your health care provider. Make sure you discuss any questions you have with your health care provider. Document Revised: 04/18/2019 Document Reviewed: 04/18/2019 Elsevier Patient Education  2021 [employer].   Moderate Conscious Sedation, Adult, Care After This sheet gives you information about how to care for yourself after your procedure. Your health care provider may also give you more specific instructions. If you have problems or questions, contact your health care provider. What can I expect after the procedure? After the procedure, it is common to have: Sleepiness  for several hours. Impaired judgment for several hours. Difficulty with balance. Vomiting if you eat too soon. Follow these instructions at home: For the time period you were told by your health care provider: Rest. Do not participate in activities where you could fall or become injured. Do not drive or use machinery. Do not  drink alcohol. Do not take sleeping pills or medicines that cause drowsiness. Do not make important decisions or sign legal documents. Do not take care of children on your own.      Eating and drinking Follow the diet recommended by your health care provider. Drink enough fluid to keep your urine pale yellow. If you vomit: Drink water, juice, or soup when you can drink without vomiting. Make sure you have little or no nausea before eating solid foods.   General instructions Take over-the-counter and prescription medicines only as told by your health care provider. Have a responsible adult stay with you for the time you are told. It is important to have someone help care for you until you are awake and alert. Do not smoke. Keep all follow-up visits as told by your health care provider. This is important. Contact a health care provider if: You are still sleepy or having trouble with balance after 24 hours. You feel light-headed. You keep feeling nauseous or you keep vomiting. You develop a rash. You have a fever. You have redness or swelling around the IV site. Get help right away if: You have trouble breathing. You have new-onset confusion at home. Summary After the procedure, it is common to feel sleepy, have impaired judgment, or feel nauseous if you eat too soon. Rest after you get home. Know the things you should not do after the procedure. Follow the diet recommended by your health care provider and drink enough fluid to keep your urine pale yellow. Get help right away if you have trouble breathing or new-onset confusion at home. This information is not intended to replace advice given to you by your health care provider. Make sure you discuss any questions you have with your health care provider. Document Revised: 03/29/2020 Document Reviewed: 10/26/2019 Elsevier Patient Education  2021 [employer].

## 2023-02-17 ENCOUNTER — Other Ambulatory Visit: Payer: Self-pay

## 2023-02-19 LAB — MULTIPLE MYELOMA PANEL, SERUM
Albumin SerPl Elph-Mcnc: 4.9 g/dL — ABNORMAL HIGH (ref 2.9–4.4)
Albumin/Glob SerPl: 1.4 (ref 0.7–1.7)
Alpha 1: 0.4 g/dL (ref 0.0–0.4)
Alpha2 Glob SerPl Elph-Mcnc: 1.1 g/dL — ABNORMAL HIGH (ref 0.4–1.0)
B-Globulin SerPl Elph-Mcnc: 1.2 g/dL (ref 0.7–1.3)
Gamma Glob SerPl Elph-Mcnc: 0.9 g/dL (ref 0.4–1.8)
Globulin, Total: 3.6 g/dL (ref 2.2–3.9)
IgA: 173 mg/dL (ref 64–422)
IgG (Immunoglobin G), Serum: 632 mg/dL (ref 586–1602)
IgM (Immunoglobulin M), Srm: 20 mg/dL — ABNORMAL LOW (ref 26–217)
Total Protein ELP: 8.5 g/dL (ref 6.0–8.5)

## 2023-02-19 LAB — SURGICAL PATHOLOGY

## 2023-02-22 ENCOUNTER — Other Ambulatory Visit: Payer: Self-pay | Admitting: Hematology

## 2023-02-22 DIAGNOSIS — C9000 Multiple myeloma not having achieved remission: Secondary | ICD-10-CM

## 2023-02-22 DIAGNOSIS — Z7189 Other specified counseling: Secondary | ICD-10-CM

## 2023-02-23 ENCOUNTER — Telehealth: Payer: Self-pay | Admitting: Hematology

## 2023-02-23 NOTE — Telephone Encounter (Signed)
Called patient per 3/11 secure chat to confirm new upcoming appointments. Patient notified.

## 2023-02-25 ENCOUNTER — Other Ambulatory Visit: Payer: Self-pay | Admitting: Hematology

## 2023-02-25 DIAGNOSIS — C9000 Multiple myeloma not having achieved remission: Secondary | ICD-10-CM

## 2023-02-26 ENCOUNTER — Inpatient Hospital Stay: Payer: Medicare PPO

## 2023-02-26 ENCOUNTER — Encounter: Payer: Self-pay | Admitting: Hematology

## 2023-02-26 ENCOUNTER — Inpatient Hospital Stay (HOSPITAL_BASED_OUTPATIENT_CLINIC_OR_DEPARTMENT_OTHER): Payer: Medicare PPO | Admitting: Hematology

## 2023-02-26 ENCOUNTER — Other Ambulatory Visit: Payer: Self-pay

## 2023-02-26 ENCOUNTER — Other Ambulatory Visit: Payer: Medicare PPO

## 2023-02-26 VITALS — BP 131/60 | HR 72 | Temp 97.7°F | Resp 16 | Wt 150.2 lb

## 2023-02-26 DIAGNOSIS — Z7189 Other specified counseling: Secondary | ICD-10-CM | POA: Diagnosis not present

## 2023-02-26 DIAGNOSIS — Z79899 Other long term (current) drug therapy: Secondary | ICD-10-CM | POA: Diagnosis not present

## 2023-02-26 DIAGNOSIS — Z95828 Presence of other vascular implants and grafts: Secondary | ICD-10-CM

## 2023-02-26 DIAGNOSIS — Z5111 Encounter for antineoplastic chemotherapy: Secondary | ICD-10-CM

## 2023-02-26 DIAGNOSIS — E876 Hypokalemia: Secondary | ICD-10-CM | POA: Diagnosis not present

## 2023-02-26 DIAGNOSIS — Z5112 Encounter for antineoplastic immunotherapy: Secondary | ICD-10-CM | POA: Diagnosis not present

## 2023-02-26 DIAGNOSIS — C9000 Multiple myeloma not having achieved remission: Secondary | ICD-10-CM

## 2023-02-26 LAB — CMP (CANCER CENTER ONLY)
ALT: 9 U/L (ref 0–44)
AST: 9 U/L — ABNORMAL LOW (ref 15–41)
Albumin: 3.7 g/dL (ref 3.5–5.0)
Alkaline Phosphatase: 96 U/L (ref 38–126)
Anion gap: 7 (ref 5–15)
BUN: 10 mg/dL (ref 8–23)
CO2: 17 mmol/L — ABNORMAL LOW (ref 22–32)
Calcium: 8.2 mg/dL — ABNORMAL LOW (ref 8.9–10.3)
Chloride: 115 mmol/L — ABNORMAL HIGH (ref 98–111)
Creatinine: 0.94 mg/dL (ref 0.44–1.00)
GFR, Estimated: >60 mL/min (ref >60)
Glucose, Bld: 231 mg/dL — ABNORMAL HIGH (ref 70–99)
Potassium: 3.1 mmol/L — ABNORMAL LOW (ref 3.5–5.1)
Sodium: 139 mmol/L (ref 135–145)
Total Bilirubin: 0.8 mg/dL (ref 0.3–1.2)
Total Protein: 5.9 g/dL — ABNORMAL LOW (ref 6.5–8.1)

## 2023-02-26 LAB — CBC WITH DIFFERENTIAL (CANCER CENTER ONLY)
Abs Immature Granulocytes: 0.01 10*3/uL (ref 0.00–0.07)
Basophils Absolute: 0 10*3/uL (ref 0.0–0.1)
Basophils Relative: 1 %
Eosinophils Absolute: 0.1 10*3/uL (ref 0.0–0.5)
Eosinophils Relative: 3 %
HCT: 32.8 % — ABNORMAL LOW (ref 36.0–46.0)
Hemoglobin: 11.2 g/dL — ABNORMAL LOW (ref 12.0–15.0)
Immature Granulocytes: 0 %
Lymphocytes Relative: 27 %
Lymphs Abs: 0.9 10*3/uL (ref 0.7–4.0)
MCH: 35.7 pg — ABNORMAL HIGH (ref 26.0–34.0)
MCHC: 34.1 g/dL (ref 30.0–36.0)
MCV: 104.5 fL — ABNORMAL HIGH (ref 80.0–100.0)
Monocytes Absolute: 0.7 10*3/uL (ref 0.1–1.0)
Monocytes Relative: 21 %
Neutro Abs: 1.5 10*3/uL — ABNORMAL LOW (ref 1.7–7.7)
Neutrophils Relative %: 48 %
Platelet Count: 98 10*3/uL — ABNORMAL LOW (ref 150–400)
RBC: 3.14 MIL/uL — ABNORMAL LOW (ref 3.87–5.11)
RDW: 14.7 % (ref 11.5–15.5)
WBC Count: 3.2 10*3/uL — ABNORMAL LOW (ref 4.0–10.5)
nRBC: 0 % (ref 0.0–0.2)

## 2023-02-26 MED ORDER — HEPARIN SOD (PORK) LOCK FLUSH 100 UNIT/ML IV SOLN: 500.0000 [IU] | Freq: Once | INTRAVENOUS | Status: DC

## 2023-02-26 MED ORDER — FAMOTIDINE IN NACL 20-0.9 MG/50ML-% IV SOLN
20.0000 mg | Freq: Once | INTRAVENOUS | Status: AC
  Administered 2023-02-26: 20 mg via INTRAVENOUS
  Filled 2023-02-26: qty 50

## 2023-02-26 MED ORDER — HEPARIN SOD (PORK) LOCK FLUSH 100 UNIT/ML IV SOLN
500.0000 [IU] | Freq: Once | INTRAVENOUS | Status: AC | PRN
  Administered 2023-02-26: 500 [IU]

## 2023-02-26 MED ORDER — POTASSIUM CHLORIDE CRYS ER 20 MEQ PO TBCR
40.0000 meq | EXTENDED_RELEASE_TABLET | Freq: Once | ORAL | Status: AC
  Administered 2023-02-26: 40 meq via ORAL
  Filled 2023-02-26: qty 2

## 2023-02-26 MED ORDER — SODIUM CHLORIDE 0.9 % IV SOLN: Freq: Once | INTRAVENOUS | Status: DC

## 2023-02-26 MED ORDER — POTASSIUM CHLORIDE 20 MEQ PO PACK: 40.0000 meq | PACK | Freq: Once | ORAL | Status: DC

## 2023-02-26 MED ORDER — SODIUM CHLORIDE 0.9% FLUSH
10.0000 mL | INTRAVENOUS | Status: DC | PRN
  Administered 2023-02-26: 10 mL

## 2023-02-26 MED ORDER — SODIUM CHLORIDE 0.9% FLUSH
10.0000 mL | Freq: Once | INTRAVENOUS | Status: AC
  Administered 2023-02-26: 10 mL

## 2023-02-26 MED ORDER — DEXTROSE 5 % IV SOLN
36.0000 mg/m2 | Freq: Once | INTRAVENOUS | Status: AC
  Administered 2023-02-26: 60 mg via INTRAVENOUS
  Filled 2023-02-26: qty 30

## 2023-02-26 MED ORDER — ACETAMINOPHEN 325 MG PO TABS
650.0000 mg | ORAL_TABLET | Freq: Once | ORAL | Status: AC
  Administered 2023-02-26: 650 mg via ORAL
  Filled 2023-02-26: qty 2

## 2023-02-26 MED ORDER — DIPHENHYDRAMINE HCL 25 MG PO CAPS
25.0000 mg | ORAL_CAPSULE | Freq: Once | ORAL | Status: AC
  Administered 2023-02-26: 25 mg via ORAL
  Filled 2023-02-26: qty 1

## 2023-02-26 MED ORDER — ZOLEDRONIC ACID 4 MG/100ML IV SOLN
4.0000 mg | Freq: Once | INTRAVENOUS | Status: AC
  Administered 2023-02-26: 4 mg via INTRAVENOUS
  Filled 2023-02-26: qty 100

## 2023-02-26 MED ORDER — DEXAMETHASONE 4 MG PO TABS
8.0000 mg | ORAL_TABLET | Freq: Once | ORAL | Status: AC
  Administered 2023-02-26: 8 mg via ORAL
  Filled 2023-02-26: qty 2

## 2023-02-26 MED ORDER — PROCHLORPERAZINE MALEATE 10 MG PO TABS
10.0000 mg | ORAL_TABLET | Freq: Once | ORAL | Status: AC
  Administered 2023-02-26: 10 mg via ORAL
  Filled 2023-02-26: qty 1

## 2023-02-26 MED ORDER — SODIUM CHLORIDE 0.9 % IV SOLN: Freq: Once | INTRAVENOUS | Status: AC

## 2023-02-26 NOTE — Patient Instructions (Signed)

## 2023-02-26 NOTE — Progress Notes (Unsigned)
Labs reviewed by Dr. Irene Limbo and assessment complete.  Good for treatment today.

## 2023-02-26 NOTE — Progress Notes (Signed)
Per MD Irene Limbo ok to give zometa

## 2023-02-26 NOTE — Patient Instructions (Signed)
[facility]  Discharge Instructions: Thank you for choosing [facility] to provide your oncology and hematology care.   If you have a lab appointment with the Cancer Center, please go directly to the Cancer Center and check in at the registration area.   Wear comfortable clothing and clothing appropriate for easy access to any Portacath or PICC line.   We strive to give you quality time with your provider. You may need to reschedule your appointment if you arrive late (15 or more minutes).  Arriving late affects you and other patients whose appointments are after yours.  Also, if you miss three or more appointments without notifying the office, you may be dismissed from the clinic at the provider's discretion.      For prescription refill requests, have your pharmacy contact our office and allow 72 hours for refills to be completed.    Today you received the following chemotherapy and/or immunotherapy agents :  Kyprolis   To help prevent nausea and vomiting after your treatment, we encourage you to take your nausea medication as directed.  BELOW ARE SYMPTOMS THAT SHOULD BE REPORTED IMMEDIATELY: *FEVER GREATER THAN 100.4 F (38 C) OR HIGHER *CHILLS OR SWEATING *NAUSEA AND VOMITING THAT IS NOT CONTROLLED WITH YOUR NAUSEA MEDICATION *UNUSUAL SHORTNESS OF BREATH *UNUSUAL BRUISING OR BLEEDING *URINARY PROBLEMS (pain or burning when urinating, or frequent urination) *BOWEL PROBLEMS (unusual diarrhea, constipation, pain near the anus) TENDERNESS IN MOUTH AND THROAT WITH OR WITHOUT PRESENCE OF ULCERS (sore throat, sores in mouth, or a toothache) UNUSUAL RASH, SWELLING OR PAIN  UNUSUAL VAGINAL DISCHARGE OR ITCHING   Items with * indicate a potential emergency and should be followed up as soon as possible or go to the Emergency Department if any problems should occur.  Please show the CHEMOTHERAPY ALERT CARD or IMMUNOTHERAPY ALERT CARD at  check-in to the Emergency Department and triage nurse.  Should you have questions after your visit or need to cancel or reschedule your appointment, please contact [facility]  Dept: [phone]  and follow the prompts.  Office hours are 8:00 a.m. to 4:30 p.m. Monday - Friday. Please note that voicemails left after 4:00 p.m. may not be returned until the following business day.  We are closed weekends and major holidays. You have access to a nurse at all times for urgent questions. Please call the main number to the clinic Dept: [phone] and follow the prompts.   For any non-urgent questions, you may also contact your provider using MyChart. We now offer e-Visits for anyone 18 and older to request care online for non-urgent symptoms. For details visit [email or web address].   Also download the MyChart app! Go to the app store, search "MyChart", open the app, select , and log in with your MyChart username and password.   

## 2023-02-26 NOTE — Progress Notes (Signed)
HEMATOLOGY/ONCOLOGY CLINIC NOTE  Date of Service: 01/29/23   Patient Care Team: Hoyt Koch, MD as PCP - General (Internal Medicine) Lafayette Dragon, MD (Inactive) as Consulting Physician (Gastroenterology) Megan Salon, MD as Consulting Physician (Gynecology) Melrose Nakayama, MD as Consulting Physician (Orthopedic Surgery) Deneise Lever, MD as Consulting Physician (Pulmonary Disease) [name], MD (Ophthalmology)  CHIEF COMPLAINTS/PURPOSE OF VISIT:  Follow-up for continued evaluation and management of multiple myeloma  HISTORY OF PRESENTING ILLNESS:  Please see previous notes for details on initial presentation  Current Treatment: Carfilzomib + Revlimid maintenance.  INTERVAL HISTORY:   Faith Orr is a 82 y.o. female who is here for continued evaluation and management of the patient's multiple myeloma. Patient was last seen by me on 01/29/2023 and complained of occasional pain in her left upper shoulder/neck area as well as generalized intermittent "deep" pain in her left upper extremity. Additionally, she endorsed mild fatigue.  Today, she denies any toxicities from her current treatment. She denies any infection issues, major muscle cramps, or abdominal pain. Patient reports that she is UTD with her RSV and influenza vaccinations but has not yet received her COVID-19 booster vaccination.   She does complain of looser bowel movements. She does take Senna daily. Patient reports that she was two days late to starting her recent cycle of Revlimid. She does eat and drink water regularly.  MEDICAL HISTORY:  Past Medical History:  Diagnosis Date   Allergy    seasonal   Asthma    DEPRESSION    DIABETES MELLITUS, TYPE II    Diverticulosis    HYPERLIPIDEMIA    Macular degeneration of left eye    mild, Dr.Hecker   Obesity, unspecified    Osteoarthritis of both knees    OSTEOPENIA    Osteopenia    URINARY INCONTINENCE     SURGICAL HISTORY: Past  Surgical History:  Procedure Laterality Date   CATARACT EXTRACTION Left 05/24/2018   CESAREAN SECTION  01/1973   CYSTOSCOPY/URETEROSCOPY/HOLMIUM LASER/STENT PLACEMENT Right 09/20/2019   Procedure: CYSTOSCOPY/URETEROSCOPY/HOLMIUM LASER/STENT PLACEMENT;  Surgeon: Lucas Mallow, MD;  Location: [facility];  Service: Urology;  Laterality: Right;   FRACTURE SURGERY     IR IMAGING GUIDED PORT INSERTION  02/20/2019   left wrist surgery  2008   By Dr. Latanya Maudlin   right ankle  1994    SOCIAL HISTORY: Social History   Socioeconomic History   Marital status: Married    Spouse name: Not on file   Number of children: 1   Years of education: Not on file   Highest education level: Not on file  Occupational History    Employer: [employer]  Tobacco Use   Smoking status: Never   Smokeless tobacco: Never   Tobacco comments:    Lives with partner [name]) and son  Vaping Use   Vaping Use: Never used  Substance and Sexual Activity   Alcohol use: No    Alcohol/week: 0.0 standard drinks of alcohol   Drug use: No   Sexual activity: Never    Partners: Female    Birth control/protection: Post-menopausal    Comment: Lives with female partner (annette hicks) and 7 yo son  Other Topics Concern   Not on file  Social History Narrative   Not on file   Social Determinants of Health   Financial Resource Strain: Low Risk  (02/27/2022)   Overall Financial Resource Strain (CARDIA)    Difficulty of Paying Living  Expenses: Not hard at all  Food Insecurity: No Food Insecurity (02/27/2022)   Hunger Vital Sign    Worried About Running Out of Food in the Last Year: Never true    Ran Out of Food in the Last Year: Never true  Transportation Needs: No Transportation Needs (02/27/2022)   PRAPARE - Hydrologist (Medical): No    Lack of Transportation (Non-Medical): No  Physical Activity: Unknown (02/27/2022)   Exercise Vital Sign    Days of  Exercise per Week: Patient refused    Minutes of Exercise per Session: Patient refused  Stress: No Stress Concern Present (02/27/2022)   [place]    Feeling of Stress : Not at all  Social Connections: Unknown (02/27/2022)   Social Connection and Isolation Panel [NHANES]    Frequency of Communication with Friends and Family: Patient refused    Frequency of Social Gatherings with Friends and Family: Patient refused    Attends Religious Services: Patient refused    Active Member of Clubs or Organizations: Patient refused    Attends Archivist Meetings: Patient refused    Marital Status: Living with partner  Intimate Partner Violence: Not At Risk (05/08/2021)   Humiliation, Afraid, Rape, and Kick questionnaire    Fear of Current or Ex-Partner: No    Emotionally Abused: No    Physically Abused: No    Sexually Abused: No    FAMILY HISTORY: Family History  Problem Relation Age of Onset   Diabetes Father    Hyperlipidemia Father    Heart disease Father    Cancer Father    Hypertension Father    Colon cancer Paternal Grandmother 39   Osteoporosis Mother    Protein S deficiency Mother    Hyperlipidemia Mother    Multiple sclerosis Daughter    Cancer Other        bladder   Breast cancer Neg Hx     ALLERGIES:  is allergic to levofloxacin, penicillins, aleve [naproxen sodium], and sulfonamide derivatives.  MEDICATIONS:  Current Outpatient Medications  Medication Sig Dispense Refill   acyclovir (ZOVIRAX) 400 MG tablet Take 1 tablet (400 mg total) by mouth 2 (two) times daily. 60 tablet 5   Blood Glucose Monitoring Suppl (FREESTYLE FREEDOM LITE) W/DEVICE KIT Use to check blood sugars twice a day Dx 250.00 1 each 0   Calcium Carbonate-Vitamin D 600-400 MG-UNIT tablet Take 1 tablet by mouth 2 (two) times daily.     dexamethasone (DECADRON) 4 MG tablet Take 5 tablets (20 mg total) by mouth once a week. On D22 of  each cycle of treatment 20 tablet 5   ELIQUIS 2.5 MG TABS tablet TAKE 1 TABLET(2.5 MG) BY MOUTH TWICE DAILY 60 tablet 2   fentaNYL (DURAGESIC) 12 MCG/HR Place 1 patch onto the skin every 3 (three) days. 10 patch 0   fluticasone (FLONASE) 50 MCG/ACT nasal spray Place 1 spray into both nostrils daily. 16 g 2   glucose blood (FREESTYLE LITE) test strip CHECK BLOOD SUGAR TWICE DAILY AS DIRECTED Dx 250.00 180 each 3   Lancets (FREESTYLE) lancets Use twice daily to check sugars. 100 each 11   lenalidomide (REVLIMID) 5 MG capsule TAKE 1 CAPSULE BY MOUTH DAILY  FOR 21 DAYS, THEN 7 DAYS OFF 21 capsule 0   lidocaine-prilocaine (EMLA) cream APPLY 1 APPLICATION TO THE AFFECTED AREA AS NEEDED. USE PRIOR TO PORT ACCESS 30 g 0   meclizine (ANTIVERT) 25 MG  tablet Take 1 tablet (25 mg total) by mouth every 8 (eight) hours as needed for dizziness. 20 tablet 0   metFORMIN (GLUCOPHAGE-XR) 500 MG 24 hr tablet Take 3 tablets (1,500 mg total) by mouth daily. Annual appt due in Jan must see provider for future refills 180 tablet 0   Multiple Vitamins-Minerals (ICAPS) CAPS Take 1 capsule by mouth daily after breakfast.     ondansetron (ZOFRAN) 8 MG tablet Take 1 tablet (8 mg total) by mouth 2 (two) times daily as needed (Nausea or vomiting). 30 tablet 1   Oxycodone HCl 10 MG TABS Take 1 tablet (10 mg total) by mouth every 6 (six) hours as needed. 90 tablet 0   pantoprazole (PROTONIX) 20 MG tablet Take 1 tablet (20 mg total) by mouth daily. 30 tablet 2   polyethylene glycol (MIRALAX / GLYCOLAX) packet Take 17 g by mouth daily after breakfast.     potassium chloride SA (KLOR-CON M) 20 MEQ tablet TAKE 2 TABLETS BY MOUTH TWICE DAILY 360 tablet 1   prochlorperazine (COMPAZINE) 10 MG tablet Take 1 tablet (10 mg total) by mouth every 6 (six) hours as needed (Nausea or vomiting). 30 tablet 1   senna-docusate (SENNA S) 8.6-50 MG tablet Take 2 tablets by mouth at bedtime. 60 tablet 2   sertraline (ZOLOFT) 100 MG tablet Take 1  tablet (100 mg total) by mouth daily. 90 tablet 3   simvastatin (ZOCOR) 20 MG tablet TAKE 1 TABLET BY MOUTH EVERY DAY AT 6 PM Follow-up appt due must see provider for future refills 90 tablet 0   Vitamin D, Ergocalciferol, (DRISDOL) 1.25 MG (50000 UNIT) CAPS capsule TAKE 1 CAPSULE BY MOUTH EVERY 7 DAYS 12 capsule 0   No current facility-administered medications for this visit.   Facility-Administered Medications Ordered in Other Visits  Medication Dose Route Frequency Provider Last Rate Last Admin   heparin lock flush 100 unit/mL  500 Units Intracatheter Once PRN Brunetta Genera, MD       sodium chloride flush (NS) 0.9 % injection 10 mL  10 mL Intracatheter PRN Brunetta Genera, MD   10 mL at 10/02/19 1524   sodium chloride flush (NS) 0.9 % injection 10 mL  10 mL Intracatheter PRN Brunetta Genera, MD   10 mL at 12/04/22 1517    REVIEW OF SYSTEMS:    10 Point review of Systems was done is negative except as noted above.   PHYSICAL EXAMINATION: ECOG FS:2 - Symptomatic, <50% confined to bed VS stable.  Wt Readings from Last 3 Encounters:  01/15/23 149 lb 8 oz (67.8 kg)  01/01/23 150 lb 12 oz (68.4 kg)  12/18/22 151 lb 14.4 oz (68.9 kg)   There is no height or weight on file to calculate BMI.     GENERAL:alert, in no acute distress and comfortable SKIN: no acute rashes, no significant lesions EYES: conjunctiva are pink and non-injected, sclera anicteric OROPHARYNX: MMM, no exudates, no oropharyngeal erythema or ulceration NECK: supple, no JVD LYMPH:  no palpable lymphadenopathy in the cervical, axillary or inguinal regions LUNGS: clear to auscultation b/l with normal respiratory effort HEART: regular rate & rhythm ABDOMEN:  normoactive bowel sounds , non tender, not distended. Extremity: no pedal edema PSYCH: alert & oriented x 3 with fluent speech NEURO: no focal motor/sensory deficits       Latest Ref Rng & Units 01/29/2023    1:13 PM 01/15/2023   12:14 PM  01/01/2023   12:10 PM  CBC  WBC  4.0 - 10.5 K/uL 2.9  3.9  3.6   Hemoglobin 12.0 - 15.0 g/dL 11.6  11.8  11.7   Hematocrit 36.0 - 46.0 % 33.8  34.1  34.4   Platelets 150 - 400 K/uL 97  155  99       Latest Ref Rng & Units 01/29/2023    1:13 PM 01/15/2023   12:14 PM 01/01/2023   12:10 PM  CMP  Glucose 70 - 99 mg/dL 183  180  200   BUN 8 - 23 mg/dL 9  7  8    Creatinine 0.44 - 1.00 mg/dL 0.99  0.94  1.03   Sodium 135 - 145 mmol/L 138  137  137   Potassium 3.5 - 5.1 mmol/L 3.6  3.7  3.5   Chloride 98 - 111 mmol/L 113  111  111   CO2 22 - 32 mmol/L 17  18  18    Calcium 8.9 - 10.3 mg/dL 8.0  9.1  8.5   Total Protein 6.5 - 8.1 g/dL 6.1  6.0  5.8   Total Bilirubin 0.3 - 1.2 mg/dL 0.7  0.6  0.7   Alkaline Phos 38 - 126 U/L 99  101  102   AST 15 - 41 U/L 10  11  9    ALT 0 - 44 U/L 11  10  10       09/18/2019 BM Bx Report ([id number])   09/18/2019 FISH Panel    05/30/2019 BM Bx   01/06/2019 BM Bx:     01/06/19 Cytogenetics:      05/30/19 BM Biopsy:   09/18/2019 FISH Panel    09/18/2019 BM Surgical Pathology ([id number])     RADIOGRAPHIC STUDIES: I have personally reviewed the radiological images as listed and agreed with the findings in the report. NM PET Image Restage (PS) Whole Body  Result Date: 01/23/2023 CLINICAL DATA:  Subsequent treatment strategy for nonsecretory multiple myeloma. EXAM: NUCLEAR MEDICINE PET WHOLE BODY TECHNIQUE: 7.5 mCi F-18 FDG was injected intravenously. Full-ring PET imaging was performed from the head to foot after the radiotracer. CT data was obtained and used for attenuation correction and anatomic localization. Fasting blood glucose: 137 mg/dl COMPARISON:  06/10/2022 PET-CT. FINDINGS: Mediastinal blood pool activity: SUV max 2.9 HEAD/NECK: No hypermetabolic activity in the scalp. Hypermetabolic 1.0 cm high left lateral retropharyngeal lymph node with max SUV 8.8 (series 4/image 43), previously 1.0 cm with max SUV 10.1, mildly  decreased in metabolism. Hypermetabolic 0.9 cm left level 2 neck lymph node with max SUV 18.9 (series 4/image 54), previously 0.9 cm with max SUV 16.9, mildly increased in metabolism. No new pathologically enlarged or newly hypermetabolic cervical lymph nodes. Incidental CT findings: Right internal jugular Port-A-Cath terminates at the cavoatrial junction. CHEST: No enlarged or hypermetabolic axillary, mediastinal or hilar lymph nodes. No hypermetabolic pulmonary findings. Incidental CT findings: Coronary atherosclerosis. Atherosclerotic nonaneurysmal thoracic aorta. Stable granulomatous calcified left hilar lymph nodes and posterior left upper lobe calcified 1.1 cm granuloma. ABDOMEN/PELVIS: No abnormal hypermetabolic activity within the liver, pancreas, adrenal glands, or spleen. No hypermetabolic lymph nodes in the abdomen or pelvis. Incidental CT findings: Simple 1.2 cm posterior right liver dome cyst. Stable granulomatous liver calcifications. Atherosclerotic nonaneurysmal abdominal aorta. SKELETON: Low level FDG uptake with max SUV 2.6 associated with healed anterolateral right fifth rib fracture, decreased from previous max SUV 3.9. Mild hypermetabolism with max SUV 3.7 associated with healed posterior right sixth rib fracture, stable to slightly decreased from previous max SUV 4.0. Hypermetabolism at the left  ninth costovertebral junction associated with both degenerative changes with osteophytes and lytic changes in the adjacent T9 vertebral body with max SUV 5.0, previous max SUV 4.6, not substantially changed. No new focal hypermetabolic osseous lesions. Incidental CT findings: Fine salt and pepper lytic changes throughout skeleton, unchanged on the CT images. EXTREMITIES: Stable low level FDG uptake associated with the mixed sclerotic and lytic proximal left humeral metaphysis lesion with max SUV 2.4, previous max SUV 2.6. Generalized hypermetabolism associated with the proximal and distal marrow spaces  of the long bones and of the bilateral pelvic girdle, most pronounced in the right humeral head with max SUV 5.0, previous max SUV 4.1, mildly increased, without appreciable changes on the CT images. No new focal hypermetabolic osseous or extraosseous lesions in the extremities. Incidental CT findings: none IMPRESSION: 1. Mild mixed metabolic changes. 2. Two persistent intensely hypermetabolic left neck lymph nodes, one mildly increased and one mildly decreased in metabolism. No new sites of extraosseous hypermetabolic metastatic disease. 3. Mildly increased generalized hypermetabolism associated with the proximal and distal marrow spaces of the long bones and of the bilateral pelvic girdle, most pronounced in the right humeral head, without appreciable changes on the CT images. Findings are nonspecific and could be due to reactive marrow changes such as due to recent treatment, although active myeloma cannot be excluded. 4. Decreased low level FDG uptake associated with healed right fifth and sixth rib fractures. 5. Stable hypermetabolism at the left ninth costovertebral junction. 6. No new hypermetabolic skeletal lesions. 7.  Aortic Atherosclerosis (ICD10-I70.0). Electronically Signed   By: Ilona Sorrel M.D.   On: 01/23/2023 16:12     ASSESSMENT & PLAN:   82 y.o. female with  1.  Nonsecretory multiple Myeloma, RISS Stage III  Labs upon initial presentation from 12/08/18, blood counts are normal including WBC at 7.1k, HGB at 13.1, and PLT at 245k. Calcium normal at 10.3. Creatinine normal at 0.63. M spike at 0.5g. 12/13/18 Bone Scan revealed Multifocal uptake throughout the skeleton, consistent with diffuse metastatic disease. Primary tumor is not specified. 2. Uptake in the proximal right femur, consistent with lytic lesions. 3. Uptake in the ribs bilaterally as described. 4. Lesions in the proximal left humerus. 5. Diffuse uptake throughout the skull consistent with metastatic disease. 6. Right  paramedian uptake at the manubrium.  12/13/18 CT Right Femur revealed Numerous lytic lesions involving the right femur and a lytic lesion in the left inferior pubic ramus. Overall appearance is most concerning for multiple myeloma  12/27/18 Pretreatment 24hour UPEP observed an M spike at 18mg , and showed 199mg  total protein/day.  12/27/18 Pretreatment MMP revealed M Protein at 0.5g with IgG Lambda specificity. Kappa:Lambda light chain ratio at 0.13, with Lambda at 40.3. There is less abnormal protein and light chains than I would expect from 30% plasma cells, which suggests hypo-secretory or non-secretory neoplastic plasma cells. Will have an impact in assessing response. 01/05/19 PET/CT revealed Innumerable lytic lesions in the skeleton compatible with myeloma. Most of the larger lesions are hypermetabolic, for example including a left proximal humeral shaft lesion with maximum SUV of 8.1 and a 2.8 cm lesion in the left T9 vertebral body with maximum SUV 5.1. Most of the smaller lytic lesions, and some of the larger lesions, do not demonstrate accentuated metabolic activity. 2. 1.2 cm in short axis lymph node in the left parapharyngeal space is hypermetabolic with maximum SUV 11.8. I do not see a separate mass in the head and neck to give rise to  this hypermetabolic lymph node. 3. Mosaic attenuation in the lower lobes, nonspecific possibly from air trapping. 4.  Aortic Atherosclerosis 5. Heterogeneous activity in the liver, making it hard to exclude small liver lesions. Consider hepatic protocol MRI with and without contrast for definitive assessment. Nonobstructive right nephrolithiasis. Old granulomatous disease  01/06/19 Bone Marrow biopsy revealed interstitial increase in plasma cells (28% aspirate, 40% CD138 immunohistochemistry). Plasma cells negative for light chains consistent with a non or weakly secretory myeloma   01/06/19 Cytogenetics revealed 37% of cells with trisomy 11 or 11q deletion, and  40.5% of cells with 17p mutation  S/p 5 cycles of KRD treatment  05/31/19 BM Biopsy revealed mild atypical plasmacytosis at 5% with polytypic variation.   06/01/19 PET/CT revealed "Dominant lesion in the LEFT humerus is decreased significantly in metabolic activity. Additional hypermetabolic skeletal lytic lesions have decreased in metabolic activity or similar to comparison exam (01/05/2019). No evidence of disease progression. 2. Multiple additional lytic lesions do not have metabolic activity and unchanged. 3. No new skeletal lesions are identified. No soft tissue plasmacytoma identified. 4. Nodule / node in the LEFT parapharyngeal space which is intensely hypermetabolic not changed from prior. 5. New hypermetabolic LEFT lower lobe pulmonary nodule is indeterminate. Recommend close attention on follow-up 6. New obstructive hydronephrosis of the RIGHT kidney related to RIGHT UPJ stone."  09/18/2019 BM Bx Report which revealed "Slightly hypercellular bone marrow for age with trilineage hematopoiesis and 1% plasma cells."  09/14/2019 PET/CT Whole Body Scan ([id number]) which revealed "1. There widespread tiny lytic lesions compatible with multiple myeloma. Index larger lesions are generally similar to the prior exam, with low-grade activity such as the left T9 vertebral body lesion with maximum SUV 4.5. Is mild increase in the activity associated with a mildly sclerotic left proximal humeral lesion, maximum SUV 4.8 (previously 3.5). 2. At the site of the prior left lower lobe nodule is currently more bandlike thickening, with maximum SUV only 1.9, probably benign, continued surveillance of this region suggested. 3. There several small but hypermetabolic lymph nodes. This includes a left parapharyngeal space node measuring 1.0 cm with maximum SUV 12.3 (stable); a left level IB lymph node measuring 0.5 cm with maximum SUV 4.8 (slightly larger than prior); and a left inguinal lymph node measuring 0.7 cm in  short axis with maximum SUV 6.4 (previously 0.5 cm with maximum SUV 0.6). Significance of these lymph nodes uncertain, surveillance is recommended. 4. New 5 mm left lower lobe subpleural nodule on image 32/8, not appreciably hypermetabolic, surveillance suggested. 5. Focal subcutaneous stranding along the left perineum measuring about 2.6 by 1.1 cm on image 221/4, maximum SUV 12.5. This was not present previously and is most likely inflammatory, although given the notable SUV, surveillance of this region is suggested. 6. Other imaging findings of potential clinical significance: Aortic Atherosclerosis (ICD10-I70.0). Coronary atherosclerosis. Old granulomatous disease. Mild right hydronephrosis due to a 7 mm right UPJ calculus. 2 mm right kidney upper pole nonobstructive renal calculus. Prominent stool throughout the colon favors constipation."  12/19/2019 Thoracic & Lumbar Spine MRI ([id number]) ([id number]) revealed "Suspected myeloma lesions at T9 and S1. No compression deformity or epidural disease.  01/18/2020 PET/CT ([id number]) which revealed "1. Stable lytic lesions throughout the skeleton. The larger lytic lesions which had mild metabolic activity on comparison exam now have background metabolic activity. No evidence of active myeloma. No evidence of progression multiple myeloma.  No plasmacytoma 3. Hypermetabolic nodules in the LEFT neck may be associated deep tissues of the LEFT parotid  gland. 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  01/17/19 MRI Liver revealed Several appreciable liver lesions all have benign imaging characteristics. No MRI findings of metastatic involvement of the liver. 2. Scattered bony lesions corresponding to the lytic lesions seen at  PET-CT, compatible with active myeloma. 3. Aortic Atherosclerosis.  Mild cardiomegaly. 4. Diffuse hepatic steatosis.   3. Left lower lobe pulmonary nodule First seen on 06/01/19 PET/CT PET/CT [id number]: No hypermetabolic mediastinal or hilar nodes. No suspicious pulmonary nodules on the CT scan.  4. 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 Being managed conservatively as per patient's preference.  No symptoms from this currently.  5. Mild chronic kidney disease CMP done 04/09/2022 showed creatinine of 1.17 and GFR est of 48 consistent with mild chronic kidney disease.  PLAN:  -Discussed lab results on 02/26/2023 with patient. CBC showed WBC of 3.2K, hemoglobin of 11.2, and platelets of 98K. -Patient received a bone marrow biopsy on 02/16/2023 which revealed no overt evidence of myeloma -no signs of increased plasma cells at this time -continue current aggressive maintenance treatment -Some changes in RBC precursors not concerning at this time as pt is not anemic -Recommended patient to take COVID-19 booster vaccination if she has tolerated it well initially  -informed patient of option to adjust treatment schedule to serve for any travel needs -answered all of patient's questions -CMP revealed that potassium is low. If diarrhea symptoms are bothersome, patient is okay to take Immodium as needed. Recommend OTC probiotics. and potassium rich foods. -take potassium tablets 3 times a day for the next week to improve potassium levels -Stop Senna and only use as needed  FOLLOW UP: ***  The total time spent in the appointment was *** minutes* .  All of the patient's questions were answered with apparent satisfaction. The patient knows to call the clinic with any problems, questions or concerns.   Sullivan Lone MD MS AAHIVMS [facility] Hematology/Oncology Physician [facility]  .*Total  Encounter Time as defined by the Centers for Medicare and Medicaid Services includes, in addition to the face-to-face time of a patient visit (documented in the note above) non-face-to-face time: obtaining and reviewing outside history, ordering and reviewing medications, tests or procedures, care coordination (communications with other health care professionals or caregivers) and documentation in the medical record.    I,Mitra Faeizi,acting as a Education administrator for Sullivan Lone, MD.,have documented all relevant documentation on the behalf of Sullivan Lone, MD,as directed by  Sullivan Lone, MD while in the presence of Sullivan Lone, MD.  ***

## 2023-03-01 ENCOUNTER — Other Ambulatory Visit: Payer: Self-pay

## 2023-03-01 ENCOUNTER — Encounter: Payer: Self-pay | Admitting: Hematology

## 2023-03-01 ENCOUNTER — Encounter (HOSPITAL_COMMUNITY): Payer: Self-pay | Admitting: Hematology

## 2023-03-03 ENCOUNTER — Telehealth: Payer: Self-pay | Admitting: Internal Medicine

## 2023-03-03 ENCOUNTER — Encounter: Payer: Self-pay | Admitting: Hematology

## 2023-03-03 NOTE — Telephone Encounter (Signed)
Contacted Milana Huntsman Rodman to schedule their annual wellness visit. Appointment made for 03/09/23.  [facility] [employer] [facility] direct phone # [phone]

## 2023-03-04 ENCOUNTER — Encounter: Payer: Self-pay | Admitting: Hematology

## 2023-03-09 ENCOUNTER — Encounter: Payer: Medicare PPO | Admitting: Family Medicine

## 2023-03-09 NOTE — Progress Notes (Signed)
No show. Could not reach patient for appt.

## 2023-03-11 ENCOUNTER — Encounter: Payer: Self-pay | Admitting: Hematology

## 2023-03-11 NOTE — Progress Notes (Signed)
This encounter was created in error - please disregard.

## 2023-03-12 ENCOUNTER — Inpatient Hospital Stay: Payer: Medicare PPO

## 2023-03-12 ENCOUNTER — Other Ambulatory Visit: Payer: Self-pay

## 2023-03-12 VITALS — BP 128/68 | HR 72 | Temp 98.2°F | Resp 18 | Wt 147.2 lb

## 2023-03-12 DIAGNOSIS — C9000 Multiple myeloma not having achieved remission: Secondary | ICD-10-CM

## 2023-03-12 DIAGNOSIS — Z79899 Other long term (current) drug therapy: Secondary | ICD-10-CM | POA: Diagnosis not present

## 2023-03-12 DIAGNOSIS — Z95828 Presence of other vascular implants and grafts: Secondary | ICD-10-CM

## 2023-03-12 DIAGNOSIS — Z7189 Other specified counseling: Secondary | ICD-10-CM

## 2023-03-12 DIAGNOSIS — Z5112 Encounter for antineoplastic immunotherapy: Secondary | ICD-10-CM | POA: Diagnosis not present

## 2023-03-12 LAB — CMP (CANCER CENTER ONLY)
ALT: 10 U/L (ref 0–44)
AST: 10 U/L — ABNORMAL LOW (ref 15–41)
Albumin: 3.7 g/dL (ref 3.5–5.0)
Alkaline Phosphatase: 108 U/L (ref 38–126)
Anion gap: 8 (ref 5–15)
BUN: 7 mg/dL — ABNORMAL LOW (ref 8–23)
CO2: 16 mmol/L — ABNORMAL LOW (ref 22–32)
Calcium: 8.1 mg/dL — ABNORMAL LOW (ref 8.9–10.3)
Chloride: 115 mmol/L — ABNORMAL HIGH (ref 98–111)
Creatinine: 1.03 mg/dL — ABNORMAL HIGH (ref 0.44–1.00)
GFR, Estimated: 55 mL/min — ABNORMAL LOW (ref >60)
Glucose, Bld: 227 mg/dL — ABNORMAL HIGH (ref 70–99)
Potassium: 3.2 mmol/L — ABNORMAL LOW (ref 3.5–5.1)
Sodium: 139 mmol/L (ref 135–145)
Total Bilirubin: 0.5 mg/dL (ref 0.3–1.2)
Total Protein: 5.9 g/dL — ABNORMAL LOW (ref 6.5–8.1)

## 2023-03-12 LAB — CBC WITH DIFFERENTIAL (CANCER CENTER ONLY)
Abs Immature Granulocytes: 0.01 10*3/uL (ref 0.00–0.07)
Basophils Absolute: 0 10*3/uL (ref 0.0–0.1)
Basophils Relative: 1 %
Eosinophils Absolute: 0.1 10*3/uL (ref 0.0–0.5)
Eosinophils Relative: 2 %
HCT: 33 % — ABNORMAL LOW (ref 36.0–46.0)
Hemoglobin: 11.2 g/dL — ABNORMAL LOW (ref 12.0–15.0)
Immature Granulocytes: 0 %
Lymphocytes Relative: 24 %
Lymphs Abs: 0.8 10*3/uL (ref 0.7–4.0)
MCH: 35.8 pg — ABNORMAL HIGH (ref 26.0–34.0)
MCHC: 33.9 g/dL (ref 30.0–36.0)
MCV: 105.4 fL — ABNORMAL HIGH (ref 80.0–100.0)
Monocytes Absolute: 0.6 10*3/uL (ref 0.1–1.0)
Monocytes Relative: 18 %
Neutro Abs: 1.8 10*3/uL (ref 1.7–7.7)
Neutrophils Relative %: 55 %
Platelet Count: 144 10*3/uL — ABNORMAL LOW (ref 150–400)
RBC: 3.13 MIL/uL — ABNORMAL LOW (ref 3.87–5.11)
RDW: 14.2 % (ref 11.5–15.5)
WBC Count: 3.2 10*3/uL — ABNORMAL LOW (ref 4.0–10.5)
nRBC: 0 % (ref 0.0–0.2)

## 2023-03-12 MED ORDER — SODIUM CHLORIDE 0.9% FLUSH
10.0000 mL | Freq: Once | INTRAVENOUS | Status: AC
  Administered 2023-03-12: 10 mL

## 2023-03-12 MED ORDER — DEXAMETHASONE 4 MG PO TABS
8.0000 mg | ORAL_TABLET | Freq: Once | ORAL | Status: AC
  Administered 2023-03-12: 8 mg via ORAL
  Filled 2023-03-12: qty 2

## 2023-03-12 MED ORDER — ACETAMINOPHEN 325 MG PO TABS
650.0000 mg | ORAL_TABLET | Freq: Once | ORAL | Status: AC
  Administered 2023-03-12: 650 mg via ORAL
  Filled 2023-03-12: qty 2

## 2023-03-12 MED ORDER — DIPHENHYDRAMINE HCL 25 MG PO CAPS
25.0000 mg | ORAL_CAPSULE | Freq: Once | ORAL | Status: AC
  Administered 2023-03-12: 25 mg via ORAL
  Filled 2023-03-12: qty 1

## 2023-03-12 MED ORDER — FAMOTIDINE IN NACL 20-0.9 MG/50ML-% IV SOLN
20.0000 mg | Freq: Once | INTRAVENOUS | Status: AC
  Administered 2023-03-12: 20 mg via INTRAVENOUS
  Filled 2023-03-12: qty 50

## 2023-03-12 MED ORDER — SODIUM CHLORIDE 0.9 % IV SOLN: Freq: Once | INTRAVENOUS | Status: DC

## 2023-03-12 MED ORDER — SODIUM CHLORIDE 0.9 % IV SOLN: Freq: Once | INTRAVENOUS | Status: AC

## 2023-03-12 MED ORDER — DEXTROSE 5 % IV SOLN
36.0000 mg/m2 | Freq: Once | INTRAVENOUS | Status: AC
  Administered 2023-03-12: 60 mg via INTRAVENOUS
  Filled 2023-03-12: qty 30

## 2023-03-12 MED ORDER — PROCHLORPERAZINE MALEATE 10 MG PO TABS
10.0000 mg | ORAL_TABLET | Freq: Once | ORAL | Status: AC
  Administered 2023-03-12: 10 mg via ORAL
  Filled 2023-03-12: qty 1

## 2023-03-12 NOTE — Progress Notes (Signed)
Kyprolis diluted in D5W 161mL, Exp: 05/13/24, Lot:  [id number]   Acquanetta Belling, RPH, BCPS, BCOP 03/12/2023 1:17 PM

## 2023-03-12 NOTE — Patient Instructions (Signed)
[facility]  Discharge Instructions: Thank you for choosing [facility] to provide your oncology and hematology care.   If you have a lab appointment with the Cancer Center, please go directly to the Cancer Center and check in at the registration area.   Wear comfortable clothing and clothing appropriate for easy access to any Portacath or PICC line.   We strive to give you quality time with your provider. You may need to reschedule your appointment if you arrive late (15 or more minutes).  Arriving late affects you and other patients whose appointments are after yours.  Also, if you miss three or more appointments without notifying the office, you may be dismissed from the clinic at the provider's discretion.      For prescription refill requests, have your pharmacy contact our office and allow 72 hours for refills to be completed.    Today you received the following chemotherapy and/or immunotherapy agents :  Kyprolis   To help prevent nausea and vomiting after your treatment, we encourage you to take your nausea medication as directed.  BELOW ARE SYMPTOMS THAT SHOULD BE REPORTED IMMEDIATELY: *FEVER GREATER THAN 100.4 F (38 C) OR HIGHER *CHILLS OR SWEATING *NAUSEA AND VOMITING THAT IS NOT CONTROLLED WITH YOUR NAUSEA MEDICATION *UNUSUAL SHORTNESS OF BREATH *UNUSUAL BRUISING OR BLEEDING *URINARY PROBLEMS (pain or burning when urinating, or frequent urination) *BOWEL PROBLEMS (unusual diarrhea, constipation, pain near the anus) TENDERNESS IN MOUTH AND THROAT WITH OR WITHOUT PRESENCE OF ULCERS (sore throat, sores in mouth, or a toothache) UNUSUAL RASH, SWELLING OR PAIN  UNUSUAL VAGINAL DISCHARGE OR ITCHING   Items with * indicate a potential emergency and should be followed up as soon as possible or go to the Emergency Department if any problems should occur.  Please show the CHEMOTHERAPY ALERT CARD or IMMUNOTHERAPY ALERT CARD at  check-in to the Emergency Department and triage nurse.  Should you have questions after your visit or need to cancel or reschedule your appointment, please contact [facility]  Dept: [phone]  and follow the prompts.  Office hours are 8:00 a.m. to 4:30 p.m. Monday - Friday. Please note that voicemails left after 4:00 p.m. may not be returned until the following business day.  We are closed weekends and major holidays. You have access to a nurse at all times for urgent questions. Please call the main number to the clinic Dept: [phone] and follow the prompts.   For any non-urgent questions, you may also contact your provider using MyChart. We now offer e-Visits for anyone 18 and older to request care online for non-urgent symptoms. For details visit [email or web address].   Also download the MyChart app! Go to the app store, search "MyChart", open the app, select [facility], and log in with your MyChart username and password.   

## 2023-03-16 ENCOUNTER — Other Ambulatory Visit: Payer: Self-pay

## 2023-03-19 ENCOUNTER — Other Ambulatory Visit: Payer: Self-pay | Admitting: Hematology

## 2023-03-19 ENCOUNTER — Other Ambulatory Visit: Payer: Self-pay

## 2023-03-19 ENCOUNTER — Encounter: Payer: Self-pay | Admitting: Hematology

## 2023-03-19 DIAGNOSIS — C9000 Multiple myeloma not having achieved remission: Secondary | ICD-10-CM

## 2023-03-19 MED ORDER — OXYCODONE HCL 10 MG PO TABS: 10.0000 mg | ORAL_TABLET | Freq: Four times a day (QID) | ORAL | 0 refills | Status: DC | PRN

## 2023-03-19 MED ORDER — ONDANSETRON HCL 8 MG PO TABS: 8.0000 mg | ORAL_TABLET | Freq: Two times a day (BID) | ORAL | 1 refills | Status: DC | PRN

## 2023-03-19 MED ORDER — PANTOPRAZOLE SODIUM 20 MG PO TBEC: DELAYED_RELEASE_TABLET | ORAL | 2 refills | Status: DC

## 2023-03-23 ENCOUNTER — Other Ambulatory Visit: Payer: Self-pay

## 2023-03-25 ENCOUNTER — Other Ambulatory Visit: Payer: Self-pay

## 2023-03-25 DIAGNOSIS — C9000 Multiple myeloma not having achieved remission: Secondary | ICD-10-CM

## 2023-03-25 MED ORDER — LENALIDOMIDE 5 MG PO CAPS: ORAL_CAPSULE | ORAL | 0 refills | Status: DC

## 2023-03-26 ENCOUNTER — Inpatient Hospital Stay: Payer: Medicare PPO

## 2023-03-26 ENCOUNTER — Other Ambulatory Visit: Payer: Self-pay

## 2023-03-26 ENCOUNTER — Inpatient Hospital Stay (HOSPITAL_BASED_OUTPATIENT_CLINIC_OR_DEPARTMENT_OTHER): Payer: Medicare PPO | Admitting: Physician Assistant

## 2023-03-26 ENCOUNTER — Inpatient Hospital Stay: Payer: Medicare PPO | Attending: Hematology

## 2023-03-26 VITALS — BP 110/57 | HR 63 | Temp 97.9°F | Resp 18 | Wt 149.8 lb

## 2023-03-26 DIAGNOSIS — E876 Hypokalemia: Secondary | ICD-10-CM

## 2023-03-26 DIAGNOSIS — C9000 Multiple myeloma not having achieved remission: Secondary | ICD-10-CM

## 2023-03-26 DIAGNOSIS — Z7189 Other specified counseling: Secondary | ICD-10-CM | POA: Diagnosis not present

## 2023-03-26 DIAGNOSIS — Z5112 Encounter for antineoplastic immunotherapy: Secondary | ICD-10-CM | POA: Diagnosis not present

## 2023-03-26 DIAGNOSIS — Z95828 Presence of other vascular implants and grafts: Secondary | ICD-10-CM

## 2023-03-26 DIAGNOSIS — Z79899 Other long term (current) drug therapy: Secondary | ICD-10-CM | POA: Diagnosis not present

## 2023-03-26 LAB — CBC WITH DIFFERENTIAL (CANCER CENTER ONLY)
Abs Immature Granulocytes: 0.02 10*3/uL (ref 0.00–0.07)
Basophils Absolute: 0 10*3/uL (ref 0.0–0.1)
Basophils Relative: 1 %
Eosinophils Absolute: 0.1 10*3/uL (ref 0.0–0.5)
Eosinophils Relative: 4 %
HCT: 31.9 % — ABNORMAL LOW (ref 36.0–46.0)
Hemoglobin: 11.4 g/dL — ABNORMAL LOW (ref 12.0–15.0)
Immature Granulocytes: 1 %
Lymphocytes Relative: 26 %
Lymphs Abs: 0.8 10*3/uL (ref 0.7–4.0)
MCH: 36.9 pg — ABNORMAL HIGH (ref 26.0–34.0)
MCHC: 35.7 g/dL (ref 30.0–36.0)
MCV: 103.2 fL — ABNORMAL HIGH (ref 80.0–100.0)
Monocytes Absolute: 0.8 10*3/uL (ref 0.1–1.0)
Monocytes Relative: 27 %
Neutro Abs: 1.3 10*3/uL — ABNORMAL LOW (ref 1.7–7.7)
Neutrophils Relative %: 41 %
Platelet Count: 97 10*3/uL — ABNORMAL LOW (ref 150–400)
RBC: 3.09 MIL/uL — ABNORMAL LOW (ref 3.87–5.11)
RDW: 14.9 % (ref 11.5–15.5)
WBC Count: 3.1 10*3/uL — ABNORMAL LOW (ref 4.0–10.5)
nRBC: 0 % (ref 0.0–0.2)

## 2023-03-26 LAB — CMP (CANCER CENTER ONLY)
ALT: 10 U/L (ref 0–44)
AST: 9 U/L — ABNORMAL LOW (ref 15–41)
Albumin: 3.8 g/dL (ref 3.5–5.0)
Alkaline Phosphatase: 96 U/L (ref 38–126)
Anion gap: 7 (ref 5–15)
BUN: 7 mg/dL — ABNORMAL LOW (ref 8–23)
CO2: 17 mmol/L — ABNORMAL LOW (ref 22–32)
Calcium: 8.8 mg/dL — ABNORMAL LOW (ref 8.9–10.3)
Chloride: 114 mmol/L — ABNORMAL HIGH (ref 98–111)
Creatinine: 0.99 mg/dL (ref 0.44–1.00)
GFR, Estimated: 58 mL/min — ABNORMAL LOW (ref >60)
Glucose, Bld: 218 mg/dL — ABNORMAL HIGH (ref 70–99)
Potassium: 3 mmol/L — ABNORMAL LOW (ref 3.5–5.1)
Sodium: 138 mmol/L (ref 135–145)
Total Bilirubin: 0.8 mg/dL (ref 0.3–1.2)
Total Protein: 6.1 g/dL — ABNORMAL LOW (ref 6.5–8.1)

## 2023-03-26 MED ORDER — POTASSIUM CHLORIDE CRYS ER 20 MEQ PO TBCR
40.0000 meq | EXTENDED_RELEASE_TABLET | Freq: Once | ORAL | Status: AC
  Administered 2023-03-26: 40 meq via ORAL
  Filled 2023-03-26: qty 2

## 2023-03-26 MED ORDER — ACETAMINOPHEN 325 MG PO TABS
650.0000 mg | ORAL_TABLET | Freq: Once | ORAL | Status: AC
  Administered 2023-03-26: 650 mg via ORAL
  Filled 2023-03-26: qty 2

## 2023-03-26 MED ORDER — HEPARIN SOD (PORK) LOCK FLUSH 100 UNIT/ML IV SOLN
500.0000 [IU] | Freq: Once | INTRAVENOUS | Status: AC | PRN
  Administered 2023-03-26: 500 [IU]

## 2023-03-26 MED ORDER — FAMOTIDINE IN NACL 20-0.9 MG/50ML-% IV SOLN
20.0000 mg | Freq: Once | INTRAVENOUS | Status: AC
  Administered 2023-03-26: 20 mg via INTRAVENOUS
  Filled 2023-03-26: qty 50

## 2023-03-26 MED ORDER — SODIUM CHLORIDE 0.9% FLUSH
10.0000 mL | Freq: Once | INTRAVENOUS | Status: AC
  Administered 2023-03-26: 10 mL

## 2023-03-26 MED ORDER — SODIUM CHLORIDE 0.9% FLUSH
10.0000 mL | INTRAVENOUS | Status: DC | PRN
  Administered 2023-03-26: 10 mL

## 2023-03-26 MED ORDER — SODIUM CHLORIDE 0.9 % IV SOLN: Freq: Once | INTRAVENOUS | Status: AC

## 2023-03-26 MED ORDER — DEXTROSE 5 % IV SOLN
36.0000 mg/m2 | Freq: Once | INTRAVENOUS | Status: AC
  Administered 2023-03-26: 60 mg via INTRAVENOUS
  Filled 2023-03-26: qty 30

## 2023-03-26 MED ORDER — DEXAMETHASONE 4 MG PO TABS
8.0000 mg | ORAL_TABLET | Freq: Once | ORAL | Status: AC
  Administered 2023-03-26: 8 mg via ORAL
  Filled 2023-03-26: qty 2

## 2023-03-26 MED ORDER — DIPHENHYDRAMINE HCL 25 MG PO CAPS
25.0000 mg | ORAL_CAPSULE | Freq: Once | ORAL | Status: AC
  Administered 2023-03-26: 25 mg via ORAL
  Filled 2023-03-26: qty 1

## 2023-03-26 MED ORDER — FENTANYL 12 MCG/HR TD PT72
1.0000 | MEDICATED_PATCH | TRANSDERMAL | 0 refills | Status: DC
Start: 2023-03-26 — End: 2023-06-09

## 2023-03-26 MED ORDER — PROCHLORPERAZINE MALEATE 10 MG PO TABS
10.0000 mg | ORAL_TABLET | Freq: Once | ORAL | Status: AC
  Administered 2023-03-26: 10 mg via ORAL
  Filled 2023-03-26: qty 1

## 2023-03-26 MED ORDER — ZOLEDRONIC ACID 4 MG/100ML IV SOLN
4.0000 mg | Freq: Once | INTRAVENOUS | Status: AC
  Administered 2023-03-26: 4 mg via INTRAVENOUS
  Filled 2023-03-26: qty 100

## 2023-03-26 NOTE — Progress Notes (Signed)
Per Dr. Candise Che, OK to proceed with tx with Plt 97 and ANC 1.3; Dr. Candise Che aware K+ 3.0, supplement added.

## 2023-03-26 NOTE — Patient Instructions (Signed)
[facility]  Discharge Instructions: Thank you for choosing [facility] to provide your oncology and hematology care.   If you have a lab appointment with the Cancer Center, please go directly to the Cancer Center and check in at the registration area.   Wear comfortable clothing and clothing appropriate for easy access to any Portacath or PICC line.   We strive to give you quality time with your provider. You may need to reschedule your appointment if you arrive late (15 or more minutes).  Arriving late affects you and other patients whose appointments are after yours.  Also, if you miss three or more appointments without notifying the office, you may be dismissed from the clinic at the provider's discretion.      For prescription refill requests, have your pharmacy contact our office and allow 72 hours for refills to be completed.    Today you received the following chemotherapy and/or immunotherapy agents :  Kyprolis   To help prevent nausea and vomiting after your treatment, we encourage you to take your nausea medication as directed.  BELOW ARE SYMPTOMS THAT SHOULD BE REPORTED IMMEDIATELY: *FEVER GREATER THAN 100.4 F (38 C) OR HIGHER *CHILLS OR SWEATING *NAUSEA AND VOMITING THAT IS NOT CONTROLLED WITH YOUR NAUSEA MEDICATION *UNUSUAL SHORTNESS OF BREATH *UNUSUAL BRUISING OR BLEEDING *URINARY PROBLEMS (pain or burning when urinating, or frequent urination) *BOWEL PROBLEMS (unusual diarrhea, constipation, pain near the anus) TENDERNESS IN MOUTH AND THROAT WITH OR WITHOUT PRESENCE OF ULCERS (sore throat, sores in mouth, or a toothache) UNUSUAL RASH, SWELLING OR PAIN  UNUSUAL VAGINAL DISCHARGE OR ITCHING   Items with * indicate a potential emergency and should be followed up as soon as possible or go to the Emergency Department if any problems should occur.  Please show the CHEMOTHERAPY ALERT CARD or IMMUNOTHERAPY ALERT CARD at  check-in to the Emergency Department and triage nurse.  Should you have questions after your visit or need to cancel or reschedule your appointment, please contact [facility]  Dept: [phone]  and follow the prompts.  Office hours are 8:00 a.m. to 4:30 p.m. Monday - Friday. Please note that voicemails left after 4:00 p.m. may not be returned until the following business day.  We are closed weekends and major holidays. You have access to a nurse at all times for urgent questions. Please call the main number to the clinic Dept: [phone] and follow the prompts.   For any non-urgent questions, you may also contact your provider using MyChart. We now offer e-Visits for anyone 18 and older to request care online for non-urgent symptoms. For details visit [email or web address].   Also download the MyChart app! Go to the app store, search "MyChart", open the app, select [facility], and log in with your MyChart username and password.   

## 2023-03-28 ENCOUNTER — Encounter: Payer: Self-pay | Admitting: Hematology

## 2023-03-28 NOTE — Progress Notes (Signed)
HEMATOLOGY/ONCOLOGY CLINIC NOTE  Date of Service: 03/26/2023   Patient Care Team: Myrlene Broker, MD as PCP - General (Internal Medicine) Hart Carwin, MD (Inactive) as Consulting Physician (Gastroenterology) Jerene Bears, MD as Consulting Physician (Gynecology) Marcene Corning, MD as Consulting Physician (Orthopedic Surgery) Waymon Budge, MD as Consulting Physician (Pulmonary Disease) Mateo Flow, MD (Ophthalmology)  CHIEF COMPLAINTS/PURPOSE OF CONSULTATION:  Follow-up for continued evaluation and management of multiple myeloma  HISTORY OF PRESENTING ILLNESS:  Please see previous notes for details on initial presentation  Current Treatment: Carfilzomib + Revlimid maintenance.  INTERVAL HISTORY:   Faith Orr is a 82 y.o. female i is here for continued evaluation and management of the patient's multiple myeloma. Patient was last seen by Dr. Candise Che on 02/26/2023.She is unaccompanied for this visit.   Ms. Cosby reports that she has more fatigue when she receives her Zometa treatment. She reports that fatigue is not as prevalent the days she only receives Carfilzomib therapy. She would like to monitor her symptoms after this treatment before discussing with Dr. Candise Che. She reports having nausea but symptoms improve with prescribed nausea medications. She denies any vomiting episodes. She reports have approximately 5 episodes of diarrhea over the past two weeks. She did not take any antidiarrheals but cut back on her Senna dose. She denies easy bruising or signs of bleeding. She is otherwise feeling well without any concerning symptoms. She denies fevers, chills, sweats, shortness of breath or chest pain. She has no other complaints.  MEDICAL HISTORY:  Past Medical History:  Diagnosis Date   Allergy    seasonal   Asthma    DEPRESSION    DIABETES MELLITUS, TYPE II    Diverticulosis    HYPERLIPIDEMIA    Macular degeneration of left eye    mild, Dr.Hecker    Obesity, unspecified    Osteoarthritis of both knees    OSTEOPENIA    Osteopenia    URINARY INCONTINENCE     SURGICAL HISTORY: Past Surgical History:  Procedure Laterality Date   CATARACT EXTRACTION Left 05/24/2018   CESAREAN SECTION  01/1973   CYSTOSCOPY/URETEROSCOPY/HOLMIUM LASER/STENT PLACEMENT Right 09/20/2019   Procedure: CYSTOSCOPY/URETEROSCOPY/HOLMIUM LASER/STENT PLACEMENT;  Surgeon: [name], MD;  Location: [facility];  Service: Urology;  Laterality: Right;   FRACTURE SURGERY     IR BONE MARROW BIOPSY & ASPIRATION  02/16/2023   IR IMAGING GUIDED PORT INSERTION  02/20/2019   left wrist surgery  2008   By Dr. Yisroel Ramming   right ankle  1994    SOCIAL HISTORY: Social History   Socioeconomic History   Marital status: Married    Spouse name: Not on file   Number of children: 1   Years of education: Not on file   Highest education level: Not on file  Occupational History    Employer: [employer]  Tobacco Use   Smoking status: Never   Smokeless tobacco: Never   Tobacco comments:    Lives with partner [name]) and son  Vaping Use   Vaping Use: Never used  Substance and Sexual Activity   Alcohol use: No    Alcohol/week: 0.0 standard drinks of alcohol   Drug use: No   Sexual activity: Never    Partners: Female    Birth control/protection: Post-menopausal    Comment: Lives with female partner (annette hicks) and 46 yo son  Other Topics Concern   Not on file  Social History Narrative   Not  on file   Social Determinants of Health   Financial Resource Strain: Low Risk  (02/27/2022)   Overall Financial Resource Strain (CARDIA)    Difficulty of Paying Living Expenses: Not hard at all  Food Insecurity: No Food Insecurity (02/27/2022)   Hunger Vital Sign    Worried About Running Out of Food in the Last Year: Never true    Ran Out of Food in the Last Year: Never true  Transportation Needs: No Transportation Needs (02/27/2022)    PRAPARE - Administrator, Civil Service (Medical): No    Lack of Transportation (Non-Medical): No  Physical Activity: Patient Declined (02/27/2022)   Exercise Vital Sign    Days of Exercise per Week: Patient declined    Minutes of Exercise per Session: Patient declined  Stress: No Stress Concern Present (02/27/2022)   [employer] of Occupational Health - Occupational Stress Questionnaire    Feeling of Stress : Not at all  Social Connections: Unknown (02/27/2022)   Social Connection and Isolation Panel [NHANES]    Frequency of Communication with Friends and Family: Patient declined    Frequency of Social Gatherings with Friends and Family: Patient declined    Attends Religious Services: Patient declined    Database administrator or Organizations: Patient declined    Attends Banker Meetings: Patient declined    Marital Status: Living with partner  Intimate Partner Violence: Not At Risk (05/08/2021)   Humiliation, Afraid, Rape, and Kick questionnaire    Fear of Current or Ex-Partner: No    Emotionally Abused: No    Physically Abused: No    Sexually Abused: No    FAMILY HISTORY: Family History  Problem Relation Age of Onset   Diabetes Father    Hyperlipidemia Father    Heart disease Father    Cancer Father    Hypertension Father    Colon cancer Paternal Grandmother 27   Osteoporosis Mother    Protein S deficiency Mother    Hyperlipidemia Mother    Multiple sclerosis Daughter    Cancer Other        bladder   Breast cancer Neg Hx     ALLERGIES:  is allergic to levofloxacin, penicillins, aleve [naproxen sodium], and sulfonamide derivatives.  MEDICATIONS:  Current Outpatient Medications  Medication Sig Dispense Refill   acyclovir (ZOVIRAX) 400 MG tablet Take 1 tablet (400 mg total) by mouth 2 (two) times daily. 60 tablet 5   Blood Glucose Monitoring Suppl (FREESTYLE FREEDOM LITE) W/DEVICE KIT Use to check blood sugars twice a day Dx 250.00 1 each  0   Calcium Carbonate-Vitamin D 600-400 MG-UNIT tablet Take 1 tablet by mouth 2 (two) times daily.     dexamethasone (DECADRON) 4 MG tablet Take 5 tablets (20 mg total) by mouth once a week. On D22 of each cycle of treatment 20 tablet 5   ELIQUIS 2.5 MG TABS tablet TAKE 1 TABLET(2.5 MG) BY MOUTH TWICE DAILY 60 tablet 2   fentaNYL (DURAGESIC) 12 MCG/HR Place 1 patch onto the skin every 3 (three) days. 10 patch 0   fluticasone (FLONASE) 50 MCG/ACT nasal spray Place 1 spray into both nostrils daily. 16 g 2   glucose blood (FREESTYLE LITE) test strip CHECK BLOOD SUGAR TWICE DAILY AS DIRECTED Dx 250.00 180 each 3   Lancets (FREESTYLE) lancets Use twice daily to check sugars. 100 each 11   lenalidomide (REVLIMID) 5 MG capsule TAKE 1 CAPSULE BY MOUTH DAILY  FOR 21 DAYS, THEN 7 DAYS  OFF 21 capsule 0   lidocaine-prilocaine (EMLA) cream APPLY 1 APPLICATION TO THE AFFECTED AREA AS NEEDED. USE PRIOR TO PORT ACCESS 30 g 0   meclizine (ANTIVERT) 25 MG tablet Take 1 tablet (25 mg total) by mouth every 8 (eight) hours as needed for dizziness. 20 tablet 0   metFORMIN (GLUCOPHAGE-XR) 500 MG 24 hr tablet TAKE 3 TABLETS BY MOUTH EVERY DAY 90 tablet 3   Multiple Vitamins-Minerals (ICAPS) CAPS Take 1 capsule by mouth daily after breakfast.     ondansetron (ZOFRAN) 8 MG tablet Take 1 tablet (8 mg total) by mouth 2 (two) times daily as needed (Nausea or vomiting). 30 tablet 1   Oxycodone HCl 10 MG TABS Take 1 tablet (10 mg total) by mouth every 6 (six) hours as needed. 90 tablet 0   pantoprazole (PROTONIX) 20 MG tablet TAKE 1 TABLET(20 MG) BY MOUTH DAILY 30 tablet 2   polyethylene glycol (MIRALAX / GLYCOLAX) packet Take 17 g by mouth daily after breakfast.     potassium chloride SA (KLOR-CON M) 20 MEQ tablet TAKE 2 TABLETS BY MOUTH TWICE DAILY 360 tablet 1   prochlorperazine (COMPAZINE) 10 MG tablet Take 1 tablet (10 mg total) by mouth every 6 (six) hours as needed (Nausea or vomiting). 30 tablet 1   senna-docusate  (SENNA S) 8.6-50 MG tablet Take 2 tablets by mouth at bedtime. 60 tablet 2   sertraline (ZOLOFT) 100 MG tablet Take 1 tablet (100 mg total) by mouth daily. 90 tablet 3   simvastatin (ZOCOR) 20 MG tablet TAKE 1 TABLET BY MOUTH EVERY DAY AT 6 PM Follow-up appt due must see provider for future refills 90 tablet 0   Vitamin D, Ergocalciferol, (DRISDOL) 1.25 MG (50000 UNIT) CAPS capsule TAKE 1 CAPSULE BY MOUTH EVERY 7 DAYS 12 capsule 0   No current facility-administered medications for this visit.   Facility-Administered Medications Ordered in Other Visits  Medication Dose Route Frequency Provider Last Rate Last Admin   heparin lock flush 100 unit/mL  500 Units Intracatheter Once PRN Johney Maine, MD        REVIEW OF SYSTEMS:   10 Point review of Systems was done is negative except as noted above.  PHYSICAL EXAMINATION: ECOG FS:1 - Symptomatic but completely ambulatory  There were no vitals filed for this visit.   Day 1, Cycle 2 03/26/23  Weight 149 lb 12.8 oz (67.9 kg)  Temp 97.9 F (36.6 C)  Temp src Oral  Pulse 63  Resp 18  BP 110/57 !   Wt Readings from Last 3 Encounters:  03/26/23 149 lb 12.8 oz (67.9 kg)  03/12/23 147 lb 4 oz (66.8 kg)  02/26/23 150 lb 4 oz (68.2 kg)   There is no height or weight on file to calculate BMI.    NAD GENERAL:alert, in no acute distress and comfortable SKIN: no acute rashes, no significant lesions EYES: conjunctiva are pink and non-injected, sclera anicteric LYMPH:  no palpable lymphadenopathy in the cervical regions LUNGS: clear to auscultation b/l with normal respiratory effort HEART: regular rate & rhythm Extremity: no pedal edema PSYCH: alert & oriented x 3 with fluent speech NEURO: no focal motor/sensory deficits  .Marland Kitchen    Latest Ref Rng & Units 03/26/2023   11:36 AM 03/12/2023   11:37 AM 02/26/2023   10:46 AM  CBC  WBC 4.0 - 10.5 K/uL 3.1  3.2  3.2   Hemoglobin 12.0 - 15.0 g/dL 78.2  95.6  21.3   Hematocrit 36.0 -  46.0 %  31.9  33.0  32.8   Platelets 150 - 400 K/uL 97  144  98     . CBC    Component Value Date/Time   WBC 3.1 (L) 03/26/2023 1136   WBC 3.6 (L) 02/16/2023 0930   RBC 3.09 (L) 03/26/2023 1136   HGB 11.4 (L) 03/26/2023 1136   HCT 31.9 (L) 03/26/2023 1136   PLT 97 (L) 03/26/2023 1136   MCV 103.2 (H) 03/26/2023 1136   MCH 36.9 (H) 03/26/2023 1136   MCHC 35.7 03/26/2023 1136   RDW 14.9 03/26/2023 1136   LYMPHSABS 0.8 03/26/2023 1136   MONOABS 0.8 03/26/2023 1136   EOSABS 0.1 03/26/2023 1136   BASOSABS 0.0 03/26/2023 1136  .    Latest Ref Rng & Units 03/26/2023   11:36 AM 03/12/2023   11:37 AM 02/26/2023   10:46 AM  CMP  Glucose 70 - 99 mg/dL 654  650  354   BUN 8 - 23 mg/dL 7  7  10    Creatinine 0.44 - 1.00 mg/dL 6.56  8.12  7.51   Sodium 135 - 145 mmol/L 138  139  139   Potassium 3.5 - 5.1 mmol/L 3.0  3.2  3.1   Chloride 98 - 111 mmol/L 114  115  115   CO2 22 - 32 mmol/L 17  16  17    Calcium 8.9 - 10.3 mg/dL 8.8  8.1  8.2   Total Protein 6.5 - 8.1 g/dL 6.1  5.9  5.9   Total Bilirubin 0.3 - 1.2 mg/dL 0.8  0.5  0.8   Alkaline Phos 38 - 126 U/L 96  108  96   AST 15 - 41 U/L 9  10  9    ALT 0 - 44 U/L 10  10  9       09/18/2019 BM Bx Report ([id number])   09/18/2019 FISH Panel    05/30/2019 BM Bx   01/06/2019 BM Bx:     01/06/19 Cytogenetics:      05/30/19 BM Biopsy:   09/18/2019 FISH Panel    09/18/2019 BM Surgical Pathology ([id number])     RADIOGRAPHIC STUDIES: I have personally reviewed the radiological images as listed and agreed with the findings in the report. No results found.   ASSESSMENT & PLAN:  1.  Nonsecretory multiple Myeloma, RISS Stage III  Labs upon initial presentation from 12/08/18, blood counts are normal including WBC at 7.1k, HGB at 13.1, and PLT at 245k. Calcium normal at 10.3. Creatinine normal at 0.63. M spike at 0.5g. 12/13/18 Bone Scan revealed Multifocal uptake throughout the skeleton, consistent with diffuse  metastatic disease. Primary tumor is not specified. 2. Uptake in the proximal right femur, consistent with lytic lesions. 3. Uptake in the ribs bilaterally as described. 4. Lesions in the proximal left humerus. 5. Diffuse uptake throughout the skull consistent with metastatic disease. 6. Right paramedian uptake at the manubrium.  12/13/18 CT Right Femur revealed Numerous lytic lesions involving the right femur and a lytic lesion in the left inferior pubic ramus. Overall appearance is most concerning for multiple myeloma  12/27/18 Pretreatment 24hour UPEP observed an M spike at 18mg , and showed 199mg  total protein/day.  12/27/18 Pretreatment MMP revealed M Protein at 0.5g with IgG Lambda specificity. Kappa:Lambda light chain ratio at 0.13, with Lambda at 40.3. There is less abnormal protein and light chains than I would expect from 30% plasma cells, which suggests hypo-secretory or non-secretory neoplastic plasma cells. Will have an impact in assessing response.  01/05/19 PET/CT revealed Innumerable lytic lesions in the skeleton compatible with myeloma. Most of the larger lesions are hypermetabolic, for example including a left proximal humeral shaft lesion with maximum SUV of 8.1 and a 2.8 cm lesion in the left T9 vertebral body with maximum SUV 5.1. Most of the smaller lytic lesions, and some of the larger lesions, do not demonstrate accentuated metabolic activity. 2. 1.2 cm in short axis lymph node in the left parapharyngeal space is hypermetabolic with maximum SUV 11.8. I do not see a separate mass in the head and neck to give rise to this hypermetabolic lymph node. 3. Mosaic attenuation in the lower lobes, nonspecific possibly from air trapping. 4.  Aortic Atherosclerosis 5. Heterogeneous activity in the liver, making it hard to exclude small liver lesions. Consider hepatic protocol MRI with and without contrast for definitive assessment. Nonobstructive right nephrolithiasis. Old granulomatous  disease  01/06/19 Bone Marrow biopsy revealed interstitial increase in plasma cells (28% aspirate, 40% CD138 immunohistochemistry). Plasma cells negative for light chains consistent with a non or weakly secretory myeloma   01/06/19 Cytogenetics revealed 37% of cells with trisomy 11 or 11q deletion, and 40.5% of cells with 17p mutation  S/p 5 cycles of KRD treatment  05/31/19 BM Biopsy revealed mild atypical plasmacytosis at 5% with polytypic variation.   06/01/19 PET/CT revealed "Dominant lesion in the LEFT humerus is decreased significantly in metabolic activity. Additional hypermetabolic skeletal lytic lesions have decreased in metabolic activity or similar to comparison exam (01/05/2019). No evidence of disease progression. 2. Multiple additional lytic lesions do not have metabolic activity and unchanged. 3. No new skeletal lesions are identified. No soft tissue plasmacytoma identified. 4. Nodule / node in the LEFT parapharyngeal space which is intensely hypermetabolic not changed from prior. 5. New hypermetabolic LEFT lower lobe pulmonary nodule is indeterminate. Recommend close attention on follow-up 6. New obstructive hydronephrosis of the RIGHT kidney related to RIGHT UPJ stone."  09/18/2019 BM Bx Report which revealed "Slightly hypercellular bone marrow for age with trilineage hematopoiesis and 1% plasma cells."  09/14/2019 PET/CT Whole Body Scan ([id number]) which revealed "1. There widespread tiny lytic lesions compatible with multiple myeloma. Index larger lesions are generally similar to the prior exam, with low-grade activity such as the left T9 vertebral body lesion with maximum SUV 4.5. Is mild increase in the activity associated with a mildly sclerotic left proximal humeral lesion, maximum SUV 4.8 (previously 3.5). 2. At the site of the prior left lower lobe nodule is currently more bandlike thickening, with maximum SUV only 1.9, probably benign, continued surveillance of this region  suggested. 3. There several small but hypermetabolic lymph nodes. This includes a left parapharyngeal space node measuring 1.0 cm with maximum SUV 12.3 (stable); a left level IB lymph node measuring 0.5 cm with maximum SUV 4.8 (slightly larger than prior); and a left inguinal lymph node measuring 0.7 cm in short axis with maximum SUV 6.4 (previously 0.5 cm with maximum SUV 0.6). Significance of these lymph nodes uncertain, surveillance is recommended. 4. New 5 mm left lower lobe subpleural nodule on image 32/8, not appreciably hypermetabolic, surveillance suggested. 5. Focal subcutaneous stranding along the left perineum measuring about 2.6 by 1.1 cm on image 221/4, maximum SUV 12.5. This was not present previously and is most likely inflammatory, although given the notable SUV, surveillance of this region is suggested. 6. Other imaging findings of potential clinical significance: Aortic Atherosclerosis (ICD10-I70.0). Coronary atherosclerosis. Old granulomatous disease. Mild right hydronephrosis due to a 7 mm right  UPJ calculus. 2 mm right kidney upper pole nonobstructive renal calculus. Prominent stool throughout the colon favors constipation."  12/19/2019 Thoracic & Lumbar Spine MRI ([id number]) ([id number]) revealed "Suspected myeloma lesions at T9 and S1. No compression deformity or epidural disease.  01/18/2020 PET/CT ([id number]) which revealed "1. Stable lytic lesions throughout the skeleton. The larger lytic lesions which had mild metabolic activity on comparison exam now have background metabolic activity. No evidence of active myeloma. No evidence of progression multiple myeloma.  No plasmacytoma 3. Hypermetabolic nodules in the LEFT neck may be associated deep tissues of the LEFT parotid gland. 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 Heterogeneous liver activity -Seen on 01/05/19  PET/CT -Extra-medullary hematopoiesis vs metabolic liver disease vs hepatic malignancy ?  -01/17/19 MRI Liver revealed Several appreciable liver lesions all have benign imaging characteristics. No MRI findings of metastatic involvement of the liver. 2. Scattered bony lesions corresponding to the lytic lesions seen at PET-CT, compatible with active myeloma. 3. Aortic Atherosclerosis.  Mild cardiomegaly. 4. Diffuse hepatic steatosis.   3. Left lower lobe pulmonary nodule -First seen on 06/01/19 PET/CT -PET/CT 08/27/2020: No hypermetabolic mediastinal or hilar nodes. No suspicious pulmonary nodules on the CT scan.  4. 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 -Has been stable on last couple of scans. -Being managed conservatively as per patient's preference.  No symptoms from this currently.  PLAN: -Labs done today were discussed in detail with the patient. WBC 3.1, Hgb 11.4, Plt 97K, Creatinine normal. -Potassium level is 3.0. Will give 40 mEq of PO potassium chloride today with infusion -Continue on home prescription for Potassium chloride 40 mEq TID.  -Continue reduced dose Revlimid at 5 mg 3 weeks on 1 week off -Continue carfilzomib 36 mg/m every 2 weeks for maintenance with same supportive medications -Continue Eliquis 2.5 mg p.o. twice daily for VTE prophylaxis -Continue Zometa every 4 weeks. Monitor symptoms from today's infusion. If there is more pronounced fatigue, can consider making infusion every 12 weeks rather than 4 weeks.  -Continue to cut back on Senna if there is ongoing diarrhea.   Follow up: --RTC in 2 weeks for labs with follow up and Carfilzomib  All of the patient's questions were answered with apparent satisfaction. The patient knows to call the clinic with any problems, questions or concerns.   I have spent a total of 30 minutes minutes of face-to-face and non-face-to-face time, preparing to see the patient, performing a  medically appropriate examination, counseling and educating the patient, documenting clinical information in the electronic health record,  and care coordination.   Georga Kaufmann PA-C Dept of Hematology and Oncology [facility] Phone: [phone]

## 2023-04-06 ENCOUNTER — Other Ambulatory Visit: Payer: Self-pay

## 2023-04-09 ENCOUNTER — Inpatient Hospital Stay (HOSPITAL_BASED_OUTPATIENT_CLINIC_OR_DEPARTMENT_OTHER): Payer: Medicare PPO | Admitting: Hematology

## 2023-04-09 ENCOUNTER — Inpatient Hospital Stay: Payer: Medicare PPO

## 2023-04-09 ENCOUNTER — Other Ambulatory Visit: Payer: Self-pay

## 2023-04-09 VITALS — BP 112/55 | HR 69 | Temp 98.1°F | Resp 18 | Wt 149.5 lb

## 2023-04-09 VITALS — BP 104/58 | HR 63 | Resp 14

## 2023-04-09 DIAGNOSIS — Z7189 Other specified counseling: Secondary | ICD-10-CM

## 2023-04-09 DIAGNOSIS — C9000 Multiple myeloma not having achieved remission: Secondary | ICD-10-CM

## 2023-04-09 DIAGNOSIS — R0609 Other forms of dyspnea: Secondary | ICD-10-CM

## 2023-04-09 DIAGNOSIS — Z95828 Presence of other vascular implants and grafts: Secondary | ICD-10-CM

## 2023-04-09 DIAGNOSIS — R5383 Other fatigue: Secondary | ICD-10-CM

## 2023-04-09 DIAGNOSIS — Z5112 Encounter for antineoplastic immunotherapy: Secondary | ICD-10-CM | POA: Diagnosis not present

## 2023-04-09 DIAGNOSIS — Z79899 Other long term (current) drug therapy: Secondary | ICD-10-CM | POA: Diagnosis not present

## 2023-04-09 LAB — CMP (CANCER CENTER ONLY)
ALT: 8 U/L (ref 0–44)
AST: 10 U/L — ABNORMAL LOW (ref 15–41)
Albumin: 3.7 g/dL (ref 3.5–5.0)
Alkaline Phosphatase: 100 U/L (ref 38–126)
Anion gap: 8 (ref 5–15)
BUN: 10 mg/dL (ref 8–23)
CO2: 19 mmol/L — ABNORMAL LOW (ref 22–32)
Calcium: 8.7 mg/dL — ABNORMAL LOW (ref 8.9–10.3)
Chloride: 111 mmol/L (ref 98–111)
Creatinine: 1.05 mg/dL — ABNORMAL HIGH (ref 0.44–1.00)
GFR, Estimated: 54 mL/min — ABNORMAL LOW (ref >60)
Glucose, Bld: 239 mg/dL — ABNORMAL HIGH (ref 70–99)
Potassium: 3.1 mmol/L — ABNORMAL LOW (ref 3.5–5.1)
Sodium: 138 mmol/L (ref 135–145)
Total Bilirubin: 0.6 mg/dL (ref 0.3–1.2)
Total Protein: 5.9 g/dL — ABNORMAL LOW (ref 6.5–8.1)

## 2023-04-09 LAB — CBC WITH DIFFERENTIAL (CANCER CENTER ONLY)
Abs Immature Granulocytes: 0.02 10*3/uL (ref 0.00–0.07)
Basophils Absolute: 0 10*3/uL (ref 0.0–0.1)
Basophils Relative: 1 %
Eosinophils Absolute: 0.1 10*3/uL (ref 0.0–0.5)
Eosinophils Relative: 2 %
HCT: 33 % — ABNORMAL LOW (ref 36.0–46.0)
Hemoglobin: 11.1 g/dL — ABNORMAL LOW (ref 12.0–15.0)
Immature Granulocytes: 1 %
Lymphocytes Relative: 22 %
Lymphs Abs: 0.7 10*3/uL (ref 0.7–4.0)
MCH: 35.4 pg — ABNORMAL HIGH (ref 26.0–34.0)
MCHC: 33.6 g/dL (ref 30.0–36.0)
MCV: 105.1 fL — ABNORMAL HIGH (ref 80.0–100.0)
Monocytes Absolute: 0.5 10*3/uL (ref 0.1–1.0)
Monocytes Relative: 17 %
Neutro Abs: 1.8 10*3/uL (ref 1.7–7.7)
Neutrophils Relative %: 57 %
Platelet Count: 158 10*3/uL (ref 150–400)
RBC: 3.14 MIL/uL — ABNORMAL LOW (ref 3.87–5.11)
RDW: 14.7 % (ref 11.5–15.5)
WBC Count: 3.1 10*3/uL — ABNORMAL LOW (ref 4.0–10.5)
nRBC: 0 % (ref 0.0–0.2)

## 2023-04-09 MED ORDER — PROCHLORPERAZINE MALEATE 10 MG PO TABS
10.0000 mg | ORAL_TABLET | Freq: Once | ORAL | Status: AC
  Administered 2023-04-09: 10 mg via ORAL
  Filled 2023-04-09: qty 1

## 2023-04-09 MED ORDER — SODIUM CHLORIDE 0.9 % IV SOLN: Freq: Once | INTRAVENOUS | Status: AC

## 2023-04-09 MED ORDER — SODIUM CHLORIDE 0.9% FLUSH
10.0000 mL | Freq: Once | INTRAVENOUS | Status: AC
  Administered 2023-04-09: 10 mL

## 2023-04-09 MED ORDER — SODIUM CHLORIDE 0.9% FLUSH
10.0000 mL | INTRAVENOUS | Status: DC | PRN
  Administered 2023-04-09: 10 mL

## 2023-04-09 MED ORDER — FAMOTIDINE IN NACL 20-0.9 MG/50ML-% IV SOLN
20.0000 mg | Freq: Once | INTRAVENOUS | Status: AC
  Administered 2023-04-09: 20 mg via INTRAVENOUS
  Filled 2023-04-09: qty 50

## 2023-04-09 MED ORDER — DEXAMETHASONE 4 MG PO TABS
8.0000 mg | ORAL_TABLET | Freq: Once | ORAL | Status: AC
  Administered 2023-04-09: 8 mg via ORAL
  Filled 2023-04-09: qty 2

## 2023-04-09 MED ORDER — ACETAMINOPHEN 325 MG PO TABS
650.0000 mg | ORAL_TABLET | Freq: Once | ORAL | Status: AC
  Administered 2023-04-09: 650 mg via ORAL
  Filled 2023-04-09: qty 2

## 2023-04-09 MED ORDER — DEXTROSE 5 % IV SOLN
36.0000 mg/m2 | Freq: Once | INTRAVENOUS | Status: AC
  Administered 2023-04-09: 60 mg via INTRAVENOUS
  Filled 2023-04-09: qty 30

## 2023-04-09 MED ORDER — HEPARIN SOD (PORK) LOCK FLUSH 100 UNIT/ML IV SOLN
500.0000 [IU] | Freq: Once | INTRAVENOUS | Status: AC | PRN
  Administered 2023-04-09: 500 [IU]

## 2023-04-09 MED ORDER — DIPHENHYDRAMINE HCL 25 MG PO CAPS
25.0000 mg | ORAL_CAPSULE | Freq: Once | ORAL | Status: AC
  Administered 2023-04-09: 25 mg via ORAL
  Filled 2023-04-09: qty 1

## 2023-04-09 NOTE — Patient Instructions (Signed)
[facility]   Discharge Instructions: Thank you for choosing [facility] to provide your oncology and hematology care.   If you have a lab appointment with the Cancer Center, please go directly to the Cancer Center and check in at the registration area.   Wear comfortable clothing and clothing appropriate for easy access to any Portacath or PICC line.   We strive to give you quality time with your provider. You may need to reschedule your appointment if you arrive late (15 or more minutes).  Arriving late affects you and other patients whose appointments are after yours.  Also, if you miss three or more appointments without notifying the office, you may be dismissed from the clinic at the provider's discretion.      For prescription refill requests, have your pharmacy contact our office and allow 72 hours for refills to be completed.    Today you received the following chemotherapy and/or immunotherapy agents: Carfilzomib (Kyprolis)       To help prevent nausea and vomiting after your treatment, we encourage you to take your nausea medication as directed.  BELOW ARE SYMPTOMS THAT SHOULD BE REPORTED IMMEDIATELY: *FEVER GREATER THAN 100.4 F (38 C) OR HIGHER *CHILLS OR SWEATING *NAUSEA AND VOMITING THAT IS NOT CONTROLLED WITH YOUR NAUSEA MEDICATION *UNUSUAL SHORTNESS OF BREATH *UNUSUAL BRUISING OR BLEEDING *URINARY PROBLEMS (pain or burning when urinating, or frequent urination) *BOWEL PROBLEMS (unusual diarrhea, constipation, pain near the anus) TENDERNESS IN MOUTH AND THROAT WITH OR WITHOUT PRESENCE OF ULCERS (sore throat, sores in mouth, or a toothache) UNUSUAL RASH, SWELLING OR PAIN  UNUSUAL VAGINAL DISCHARGE OR ITCHING   Items with * indicate a potential emergency and should be followed up as soon as possible or go to the Emergency Department if any problems should occur.  Please show the CHEMOTHERAPY ALERT CARD or IMMUNOTHERAPY  ALERT CARD at check-in to the Emergency Department and triage nurse.  Should you have questions after your visit or need to cancel or reschedule your appointment, please contact [facility]  Dept: [phone]  and follow the prompts.  Office hours are 8:00 a.m. to 4:30 p.m. Monday - Friday. Please note that voicemails left after 4:00 p.m. may not be returned until the following business day.  We are closed weekends and major holidays. You have access to a nurse at all times for urgent questions. Please call the main number to the clinic Dept: [phone] and follow the prompts.   For any non-urgent questions, you may also contact your provider using MyChart. We now offer e-Visits for anyone 18 and older to request care online for non-urgent symptoms. For details visit [email or web address].   Also download the MyChart app! Go to the app store, search "MyChart", open the app, select [facility], and log in with your MyChart username and password. 

## 2023-04-09 NOTE — Progress Notes (Signed)
HEMATOLOGY/ONCOLOGY CLINIC NOTE  Date of Service: 04/09/23    Patient Care Team: Myrlene Broker, MD as PCP - General (Internal Medicine) Hart Carwin, MD (Inactive) as Consulting Physician (Gastroenterology) Jerene Bears, MD as Consulting Physician (Gynecology) Marcene Corning, MD as Consulting Physician (Orthopedic Surgery) Waymon Budge, MD as Consulting Physician (Pulmonary Disease) Mateo Flow, MD (Ophthalmology)  CHIEF COMPLAINTS/PURPOSE OF VISIT:  Follow-up for continued evaluation and management of multiple myeloma  HISTORY OF PRESENTING ILLNESS:  Please see previous notes for details on initial presentation  Current Treatment: Carfilzomib + Revlimid maintenance.  INTERVAL HISTORY:   Faith Orr is a 82 y.o. female who is here for continued evaluation and management of the patient's multiple myeloma. Patient was last seen by me on 02/26/2023 and complained of looser bowel movements, but was otherwise doing well overall with no new medical concerns.   Patient was most recently seen by Thayil PA on 03/26/2023 and reported increased fatigue with Zometa treatment, nausea, and diarrhea.  Today, she complains of severe generalized fatigue over the past two weeks. She notes that her fatigue is worsened after Zometa treatment. Her fatigue limits her from completing daily activities. Has not noticed any changes in fatigue during the week off of Revlimid. She reports a sister-in-law is currently hospitalized and has cerebral palsy which is stressful. Patient denies any seasonal allergies and uses Flonase when outdoors. She denies any spring activities that may have fatigue.  She does drink at least 64 ounces of water daily. She regularly takes her vitamin D supplements. She denies any major changes in her diet and regularly consumes protein and fruits. She notes that she does not exercise regularly.  She does report stable discomfort in her back but denies any  new bone pain. She denies any abdominal pain or leg swelling.  She complains of random episodes of vomiting. She denies any changes in bowel habits. She does endorse intermittent nausea and takes Zofran which improves symptoms. She reports that acid suppressant manages her acid reflux.   MEDICAL HISTORY:  Past Medical History:  Diagnosis Date   Allergy    seasonal   Asthma    DEPRESSION    DIABETES MELLITUS, TYPE II    Diverticulosis    HYPERLIPIDEMIA    Macular degeneration of left eye    mild, Dr.Hecker   Obesity, unspecified    Osteoarthritis of both knees    OSTEOPENIA    Osteopenia    URINARY INCONTINENCE     SURGICAL HISTORY: Past Surgical History:  Procedure Laterality Date   CATARACT EXTRACTION Left 05/24/2018   CESAREAN SECTION  01/1973   CYSTOSCOPY/URETEROSCOPY/HOLMIUM LASER/STENT PLACEMENT Right 09/20/2019   Procedure: CYSTOSCOPY/URETEROSCOPY/HOLMIUM LASER/STENT PLACEMENT;  Surgeon: [name], MD;  Location: [facility];  Service: Urology;  Laterality: Right;   FRACTURE SURGERY     IR IMAGING GUIDED PORT INSERTION  02/20/2019   left wrist surgery  2008   By Dr. Yisroel Ramming   right ankle  1994    SOCIAL HISTORY: Social History   Socioeconomic History   Marital status: Married    Spouse name: Not on file   Number of children: 1   Years of education: Not on file   Highest education level: Not on file  Occupational History    Employer: [employer]  Tobacco Use   Smoking status: Never   Smokeless tobacco: Never   Tobacco comments:    Lives with partner [name]) and son  Vaping Use   Vaping Use: Never used  Substance and Sexual Activity   Alcohol use: No    Alcohol/week: 0.0 standard drinks of alcohol   Drug use: No   Sexual activity: Never    Partners: Female    Birth control/protection: Post-menopausal    Comment: Lives with female partner (annette hicks) and 81 yo son  Other Topics Concern   Not on file   Social History Narrative   Not on file   Social Determinants of Health   Financial Resource Strain: Low Risk  (02/27/2022)   Overall Financial Resource Strain (CARDIA)    Difficulty of Paying Living Expenses: Not hard at all  Food Insecurity: No Food Insecurity (02/27/2022)   Hunger Vital Sign    Worried About Running Out of Food in the Last Year: Never true    Ran Out of Food in the Last Year: Never true  Transportation Needs: No Transportation Needs (02/27/2022)   PRAPARE - Administrator, Civil Service (Medical): No    Lack of Transportation (Non-Medical): No  Physical Activity: Unknown (02/27/2022)   Exercise Vital Sign    Days of Exercise per Week: Patient refused    Minutes of Exercise per Session: Patient refused  Stress: No Stress Concern Present (02/27/2022)   [employer] of Occupational Health - Occupational Stress Questionnaire    Feeling of Stress : Not at all  Social Connections: Unknown (02/27/2022)   Social Connection and Isolation Panel [NHANES]    Frequency of Communication with Friends and Family: Patient refused    Frequency of Social Gatherings with Friends and Family: Patient refused    Attends Religious Services: Patient refused    Active Member of Clubs or Organizations: Patient refused    Attends Banker Meetings: Patient refused    Marital Status: Living with partner  Intimate Partner Violence: Not At Risk (05/08/2021)   Humiliation, Afraid, Rape, and Kick questionnaire    Fear of Current or Ex-Partner: No    Emotionally Abused: No    Physically Abused: No    Sexually Abused: No    FAMILY HISTORY: Family History  Problem Relation Age of Onset   Diabetes Father    Hyperlipidemia Father    Heart disease Father    Cancer Father    Hypertension Father    Colon cancer Paternal Grandmother 71   Osteoporosis Mother    Protein S deficiency Mother    Hyperlipidemia Mother    Multiple sclerosis Daughter    Cancer Other         bladder   Breast cancer Neg Hx     ALLERGIES:  is allergic to levofloxacin, penicillins, aleve [naproxen sodium], and sulfonamide derivatives.  MEDICATIONS:  Current Outpatient Medications  Medication Sig Dispense Refill   acyclovir (ZOVIRAX) 400 MG tablet Take 1 tablet (400 mg total) by mouth 2 (two) times daily. 60 tablet 5   Blood Glucose Monitoring Suppl (FREESTYLE FREEDOM LITE) W/DEVICE KIT Use to check blood sugars twice a day Dx 250.00 1 each 0   Calcium Carbonate-Vitamin D 600-400 MG-UNIT tablet Take 1 tablet by mouth 2 (two) times daily.     dexamethasone (DECADRON) 4 MG tablet Take 5 tablets (20 mg total) by mouth once a week. On D22 of each cycle of treatment 20 tablet 5   ELIQUIS 2.5 MG TABS tablet TAKE 1 TABLET(2.5 MG) BY MOUTH TWICE DAILY 60 tablet 2   fentaNYL (DURAGESIC) 12 MCG/HR Place 1 patch onto the skin every 3 (  three) days. 10 patch 0   fluticasone (FLONASE) 50 MCG/ACT nasal spray Place 1 spray into both nostrils daily. 16 g 2   glucose blood (FREESTYLE LITE) test strip CHECK BLOOD SUGAR TWICE DAILY AS DIRECTED Dx 250.00 180 each 3   Lancets (FREESTYLE) lancets Use twice daily to check sugars. 100 each 11   lenalidomide (REVLIMID) 5 MG capsule TAKE 1 CAPSULE BY MOUTH DAILY  FOR 21 DAYS, THEN 7 DAYS OFF 21 capsule 0   lidocaine-prilocaine (EMLA) cream APPLY 1 APPLICATION TO THE AFFECTED AREA AS NEEDED. USE PRIOR TO PORT ACCESS 30 g 0   meclizine (ANTIVERT) 25 MG tablet Take 1 tablet (25 mg total) by mouth every 8 (eight) hours as needed for dizziness. 20 tablet 0   metFORMIN (GLUCOPHAGE-XR) 500 MG 24 hr tablet Take 3 tablets (1,500 mg total) by mouth daily. Annual appt due in Jan must see provider for future refills 180 tablet 0   Multiple Vitamins-Minerals (ICAPS) CAPS Take 1 capsule by mouth daily after breakfast.     ondansetron (ZOFRAN) 8 MG tablet Take 1 tablet (8 mg total) by mouth 2 (two) times daily as needed (Nausea or vomiting). 30 tablet 1   Oxycodone HCl 10  MG TABS Take 1 tablet (10 mg total) by mouth every 6 (six) hours as needed. 90 tablet 0   pantoprazole (PROTONIX) 20 MG tablet Take 1 tablet (20 mg total) by mouth daily. 30 tablet 2   polyethylene glycol (MIRALAX / GLYCOLAX) packet Take 17 g by mouth daily after breakfast.     potassium chloride SA (KLOR-CON M) 20 MEQ tablet TAKE 2 TABLETS BY MOUTH TWICE DAILY 360 tablet 1   prochlorperazine (COMPAZINE) 10 MG tablet Take 1 tablet (10 mg total) by mouth every 6 (six) hours as needed (Nausea or vomiting). 30 tablet 1   senna-docusate (SENNA S) 8.6-50 MG tablet Take 2 tablets by mouth at bedtime. 60 tablet 2   sertraline (ZOLOFT) 100 MG tablet Take 1 tablet (100 mg total) by mouth daily. 90 tablet 3   simvastatin (ZOCOR) 20 MG tablet TAKE 1 TABLET BY MOUTH EVERY DAY AT 6 PM Follow-up appt due must see provider for future refills 90 tablet 0   Vitamin D, Ergocalciferol, (DRISDOL) 1.25 MG (50000 UNIT) CAPS capsule TAKE 1 CAPSULE BY MOUTH EVERY 7 DAYS 12 capsule 0   No current facility-administered medications for this visit.   Facility-Administered Medications Ordered in Other Visits  Medication Dose Route Frequency Provider Last Rate Last Admin   heparin lock flush 100 unit/mL  500 Units Intracatheter Once PRN Johney Maine, MD       sodium chloride flush (NS) 0.9 % injection 10 mL  10 mL Intracatheter PRN Johney Maine, MD   10 mL at 10/02/19 1524   sodium chloride flush (NS) 0.9 % injection 10 mL  10 mL Intracatheter PRN Johney Maine, MD   10 mL at 12/04/22 1517    REVIEW OF SYSTEMS:    10 Point review of Systems was done is negative except as noted above.   PHYSICAL EXAMINATION: ECOG FS:2 - Symptomatic, <50% confined to bed .BP 131/60 (BP Location: Left Arm, Patient Position: Sitting)   Pulse 72   Temp 97.7 F (36.5 C) (Temporal)   Resp 16   Wt 150 lb 4 oz (68.2 kg)   LMP 10/09/2012   SpO2 100%   BMI 30.35 kg/m    Wt Readings from Last 3 Encounters:   01/15/23 149 lb 8  oz (67.8 kg)  01/01/23 150 lb 12 oz (68.4 kg)  12/18/22 151 lb 14.4 oz (68.9 kg)   Body mass index is 30.35 kg/m.     GENERAL:alert, in no acute distress and comfortable SKIN: no acute rashes, no significant lesions EYES: conjunctiva are pink and non-injected, sclera anicteric OROPHARYNX: MMM, no exudates, no oropharyngeal erythema or ulceration NECK: supple, no JVD LYMPH:  no palpable lymphadenopathy in the cervical, axillary or inguinal regions LUNGS: clear to auscultation b/l with normal respiratory effort HEART: regular rate & rhythm ABDOMEN:  normoactive bowel sounds , non tender, not distended. Extremity: no pedal edema PSYCH: alert & oriented x 3 with fluent speech NEURO: no focal motor/sensory deficits       Latest Ref Rng & Units 04/09/2023   11:21 AM 03/26/2023   11:36 AM 03/12/2023   11:37 AM  CBC  WBC 4.0 - 10.5 K/uL 3.1  3.1  3.2   Hemoglobin 12.0 - 15.0 g/dL 16.1  09.6  04.5   Hematocrit 36.0 - 46.0 % 33.0  31.9  33.0   Platelets 150 - 400 K/uL 158  97  144       Latest Ref Rng & Units 04/09/2023   11:21 AM 03/26/2023   11:36 AM 03/12/2023   11:37 AM  CMP  Glucose 70 - 99 mg/dL 409  811  914   BUN 8 - 23 mg/dL 10  7  7    Creatinine 0.44 - 1.00 mg/dL 7.82  9.56  2.13   Sodium 135 - 145 mmol/L 138  138  139   Potassium 3.5 - 5.1 mmol/L 3.1  3.0  3.2   Chloride 98 - 111 mmol/L 111  114  115   CO2 22 - 32 mmol/L 19  17  16    Calcium 8.9 - 10.3 mg/dL 8.7  8.8  8.1   Total Protein 6.5 - 8.1 g/dL 5.9  6.1  5.9   Total Bilirubin 0.3 - 1.2 mg/dL 0.6  0.8  0.5   Alkaline Phos 38 - 126 U/L 100  96  108   AST 15 - 41 U/L 10  9  10    ALT 0 - 44 U/L 8  10  10       09/18/2019 BM Bx Report ([id number])   09/18/2019 FISH Panel    05/30/2019 BM Bx   01/06/2019 BM Bx:     01/06/19 Cytogenetics:      05/30/19 BM Biopsy:   09/18/2019 FISH Panel    09/18/2019 BM Surgical Pathology ([id number])     RADIOGRAPHIC STUDIES: I  have personally reviewed the radiological images as listed and agreed with the findings in the report. NM PET Image Restage (PS) Whole Body  Result Date: 01/23/2023 CLINICAL DATA:  Subsequent treatment strategy for nonsecretory multiple myeloma. EXAM: NUCLEAR MEDICINE PET WHOLE BODY TECHNIQUE: 7.5 mCi F-18 FDG was injected intravenously. Full-ring PET imaging was performed from the head to foot after the radiotracer. CT data was obtained and used for attenuation correction and anatomic localization. Fasting blood glucose: 137 mg/dl COMPARISON:  08/65/7846 PET-CT. FINDINGS: Mediastinal blood pool activity: SUV max 2.9 HEAD/NECK: No hypermetabolic activity in the scalp. Hypermetabolic 1.0 cm high left lateral retropharyngeal lymph node with max SUV 8.8 (series 4/image 43), previously 1.0 cm with max SUV 10.1, mildly decreased in metabolism. Hypermetabolic 0.9 cm left level 2 neck lymph node with max SUV 18.9 (series 4/image 54), previously 0.9 cm with max SUV 16.9, mildly increased in metabolism. No new pathologically enlarged  or newly hypermetabolic cervical lymph nodes. Incidental CT findings: Right internal jugular Port-A-Cath terminates at the cavoatrial junction. CHEST: No enlarged or hypermetabolic axillary, mediastinal or hilar lymph nodes. No hypermetabolic pulmonary findings. Incidental CT findings: Coronary atherosclerosis. Atherosclerotic nonaneurysmal thoracic aorta. Stable granulomatous calcified left hilar lymph nodes and posterior left upper lobe calcified 1.1 cm granuloma. ABDOMEN/PELVIS: No abnormal hypermetabolic activity within the liver, pancreas, adrenal glands, or spleen. No hypermetabolic lymph nodes in the abdomen or pelvis. Incidental CT findings: Simple 1.2 cm posterior right liver dome cyst. Stable granulomatous liver calcifications. Atherosclerotic nonaneurysmal abdominal aorta. SKELETON: Low level FDG uptake with max SUV 2.6 associated with healed anterolateral right fifth rib fracture,  decreased from previous max SUV 3.9. Mild hypermetabolism with max SUV 3.7 associated with healed posterior right sixth rib fracture, stable to slightly decreased from previous max SUV 4.0. Hypermetabolism at the left ninth costovertebral junction associated with both degenerative changes with osteophytes and lytic changes in the adjacent T9 vertebral body with max SUV 5.0, previous max SUV 4.6, not substantially changed. No new focal hypermetabolic osseous lesions. Incidental CT findings: Fine salt and pepper lytic changes throughout skeleton, unchanged on the CT images. EXTREMITIES: Stable low level FDG uptake associated with the mixed sclerotic and lytic proximal left humeral metaphysis lesion with max SUV 2.4, previous max SUV 2.6. Generalized hypermetabolism associated with the proximal and distal marrow spaces of the long bones and of the bilateral pelvic girdle, most pronounced in the right humeral head with max SUV 5.0, previous max SUV 4.1, mildly increased, without appreciable changes on the CT images. No new focal hypermetabolic osseous or extraosseous lesions in the extremities. Incidental CT findings: none IMPRESSION: 1. Mild mixed metabolic changes. 2. Two persistent intensely hypermetabolic left neck lymph nodes, one mildly increased and one mildly decreased in metabolism. No new sites of extraosseous hypermetabolic metastatic disease. 3. Mildly increased generalized hypermetabolism associated with the proximal and distal marrow spaces of the long bones and of the bilateral pelvic girdle, most pronounced in the right humeral head, without appreciable changes on the CT images. Findings are nonspecific and could be due to reactive marrow changes such as due to recent treatment, although active myeloma cannot be excluded. 4. Decreased low level FDG uptake associated with healed right fifth and sixth rib fractures. 5. Stable hypermetabolism at the left ninth costovertebral junction. 6. No new  hypermetabolic skeletal lesions. 7.  Aortic Atherosclerosis (ICD10-I70.0). Electronically Signed   By: Delbert Phenix M.D.   On: 01/23/2023 16:12     ASSESSMENT & PLAN:   82 y.o. female with  1.  Nonsecretory multiple Myeloma, RISS Stage III  Labs upon initial presentation from 12/08/18, blood counts are normal including WBC at 7.1k, HGB at 13.1, and PLT at 245k. Calcium normal at 10.3. Creatinine normal at 0.63. M spike at 0.5g. 12/13/18 Bone Scan revealed Multifocal uptake throughout the skeleton, consistent with diffuse metastatic disease. Primary tumor is not specified. 2. Uptake in the proximal right femur, consistent with lytic lesions. 3. Uptake in the ribs bilaterally as described. 4. Lesions in the proximal left humerus. 5. Diffuse uptake throughout the skull consistent with metastatic disease. 6. Right paramedian uptake at the manubrium.  12/13/18 CT Right Femur revealed Numerous lytic lesions involving the right femur and a lytic lesion in the left inferior pubic ramus. Overall appearance is most concerning for multiple myeloma  12/27/18 Pretreatment 24hour UPEP observed an M spike at 18mg , and showed 199mg  total protein/day.  12/27/18 Pretreatment MMP revealed M  Protein at 0.5g with IgG Lambda specificity. Kappa:Lambda light chain ratio at 0.13, with Lambda at 40.3. There is less abnormal protein and light chains than I would expect from 30% plasma cells, which suggests hypo-secretory or non-secretory neoplastic plasma cells. Will have an impact in assessing response. 01/05/19 PET/CT revealed Innumerable lytic lesions in the skeleton compatible with myeloma. Most of the larger lesions are hypermetabolic, for example including a left proximal humeral shaft lesion with maximum SUV of 8.1 and a 2.8 cm lesion in the left T9 vertebral body with maximum SUV 5.1. Most of the smaller lytic lesions, and some of the larger lesions, do not demonstrate accentuated metabolic activity. 2. 1.2 cm in short  axis lymph node in the left parapharyngeal space is hypermetabolic with maximum SUV 11.8. I do not see a separate mass in the head and neck to give rise to this hypermetabolic lymph node. 3. Mosaic attenuation in the lower lobes, nonspecific possibly from air trapping. 4.  Aortic Atherosclerosis 5. Heterogeneous activity in the liver, making it hard to exclude small liver lesions. Consider hepatic protocol MRI with and without contrast for definitive assessment. Nonobstructive right nephrolithiasis. Old granulomatous disease  01/06/19 Bone Marrow biopsy revealed interstitial increase in plasma cells (28% aspirate, 40% CD138 immunohistochemistry). Plasma cells negative for light chains consistent with a non or weakly secretory myeloma   01/06/19 Cytogenetics revealed 37% of cells with trisomy 11 or 11q deletion, and 40.5% of cells with 17p mutation  S/p 5 cycles of KRD treatment  05/31/19 BM Biopsy revealed mild atypical plasmacytosis at 5% with polytypic variation.   06/01/19 PET/CT revealed "Dominant lesion in the LEFT humerus is decreased significantly in metabolic activity. Additional hypermetabolic skeletal lytic lesions have decreased in metabolic activity or similar to comparison exam (01/05/2019). No evidence of disease progression. 2. Multiple additional lytic lesions do not have metabolic activity and unchanged. 3. No new skeletal lesions are identified. No soft tissue plasmacytoma identified. 4. Nodule / node in the LEFT parapharyngeal space which is intensely hypermetabolic not changed from prior. 5. New hypermetabolic LEFT lower lobe pulmonary nodule is indeterminate. Recommend close attention on follow-up 6. New obstructive hydronephrosis of the RIGHT kidney related to RIGHT UPJ stone."  09/18/2019 BM Bx Report which revealed "Slightly hypercellular bone marrow for age with trilineage hematopoiesis and 1% plasma cells."  09/14/2019 PET/CT Whole Body Scan ([id number]) which revealed "1. There  widespread tiny lytic lesions compatible with multiple myeloma. Index larger lesions are generally similar to the prior exam, with low-grade activity such as the left T9 vertebral body lesion with maximum SUV 4.5. Is mild increase in the activity associated with a mildly sclerotic left proximal humeral lesion, maximum SUV 4.8 (previously 3.5). 2. At the site of the prior left lower lobe nodule is currently more bandlike thickening, with maximum SUV only 1.9, probably benign, continued surveillance of this region suggested. 3. There several small but hypermetabolic lymph nodes. This includes a left parapharyngeal space node measuring 1.0 cm with maximum SUV 12.3 (stable); a left level IB lymph node measuring 0.5 cm with maximum SUV 4.8 (slightly larger than prior); and a left inguinal lymph node measuring 0.7 cm in short axis with maximum SUV 6.4 (previously 0.5 cm with maximum SUV 0.6). Significance of these lymph nodes uncertain, surveillance is recommended. 4. New 5 mm left lower lobe subpleural nodule on image 32/8, not appreciably hypermetabolic, surveillance suggested. 5. Focal subcutaneous stranding along the left perineum measuring about 2.6 by 1.1 cm on image 221/4, maximum  SUV 12.5. This was not present previously and is most likely inflammatory, although given the notable SUV, surveillance of this region is suggested. 6. Other imaging findings of potential clinical significance: Aortic Atherosclerosis (ICD10-I70.0). Coronary atherosclerosis. Old granulomatous disease. Mild right hydronephrosis due to a 7 mm right UPJ calculus. 2 mm right kidney upper pole nonobstructive renal calculus. Prominent stool throughout the colon favors constipation."  12/19/2019 Thoracic & Lumbar Spine MRI ([id number]) ([id number]) revealed "Suspected myeloma lesions at T9 and S1. No compression deformity or epidural disease.  01/18/2020 PET/CT ([id number]) which revealed "1. Stable lytic lesions throughout the skeleton. The  larger lytic lesions which had mild metabolic activity on comparison exam now have background metabolic activity. No evidence of active myeloma. No evidence of progression multiple myeloma.  No plasmacytoma 3. Hypermetabolic nodules in the LEFT neck may be associated deep tissues of the LEFT parotid gland. 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  01/17/19 MRI Liver revealed Several appreciable liver lesions all have benign imaging characteristics. No MRI findings of metastatic involvement of the liver. 2. Scattered bony lesions corresponding to the lytic lesions seen at PET-CT, compatible with active myeloma. 3. Aortic Atherosclerosis.  Mild cardiomegaly. 4. Diffuse hepatic steatosis.   3. Left lower lobe pulmonary nodule First seen on 06/01/19 PET/CT PET/CT 08/27/2020: No hypermetabolic mediastinal or hilar nodes. No suspicious pulmonary nodules on the CT scan.  4. 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 Being managed conservatively as per patient's preference.  No symptoms from this currently.  5. Mild chronic kidney disease CMP done 04/09/2022 showed creatinine of 1.17 and GFR est of 48 consistent with mild chronic kidney disease.  PLAN:  -Discussed lab results on 04/09/2023 in detail with patient. CBC stable showed WBC of 3.1K, hemoglobin of 11.1, and platelets improved to 158K. -stable mild anemia -CMP stable K 3.1 -no signs of increased plasma cells at this time -informed patient that Zometa drives calcium into the bones which may cause increased fatigue -informed patient that a lack of exercise may cause fatigue -discussed details of distinction between mental  and physical fatigue -recommend at least 20-30 minutes of brisk walking daily to prevent treatment-related fatigue -continue to drink at least 2L of water daily and consume a well-balanced diet -informed patient that vomiting may be due to a viral gastroenteritis -continue current aggressive maintenance treatment, but adjust Zometa dose to every 12 weeks to improve fatigue -will order echocardiogram to reassess any SOB or limitations -recommend patient to take OTC calcium, or consume calcium-rich foods/drinks such as milk, cottage cheese, or tea prior to Zometa treatment to prevent any excessive fatigue  FOLLOW UP: Switching Zometa to every 12 weeks next dose was due 06/25/2023 Please schedule carfilzomib for the next 3 months as per integrated scheduling Echo in 2 to 3 weeks Next MD visit in 4 weeks  The total time spent in the appointment was 30 minutes* .  All of the patient's questions were answered with apparent satisfaction. The patient knows to call the clinic with any problems, questions or concerns.   Wyvonnia Lora MD MS AAHIVMS [facility] Hematology/Oncology Physician [facility]  .*Total Encounter Time as defined by the Centers for Medicare and Medicaid Services includes,  in addition to the face-to-face time of a patient visit (documented in the note above) non-face-to-face time: obtaining and reviewing outside history, ordering and reviewing medications, tests or procedures, care coordination (communications with other health care professionals or caregivers) and documentation in the medical record.    I,Mitra Faeizi,acting as a Neurosurgeon for Wyvonnia Lora, MD.,have documented all relevant documentation on the behalf of Wyvonnia Lora, MD,as directed by  Wyvonnia Lora, MD while in the presence of Wyvonnia Lora, MD.  .I have reviewed the above documentation for accuracy and completeness, and I agree with the above. Johney Maine MD

## 2023-04-09 NOTE — Progress Notes (Signed)
Patient seen by Dr. Addison Naegeli are within treatment parameters.  Labs reviewed: and are not all within treatment parameters. Dr Candise Che aware K: 3.1, CR: 1.05    Per physician team, patient is ready for treatment and there are NO modifications to the treatment plan.

## 2023-04-12 ENCOUNTER — Other Ambulatory Visit: Payer: Self-pay

## 2023-04-12 DIAGNOSIS — C9000 Multiple myeloma not having achieved remission: Secondary | ICD-10-CM

## 2023-04-12 LAB — KAPPA/LAMBDA LIGHT CHAINS
Kappa free light chain: 17 mg/L (ref 3.3–19.4)
Kappa, lambda light chain ratio: 1.21 (ref 0.26–1.65)
Lambda free light chains: 14.1 mg/L (ref 5.7–26.3)

## 2023-04-16 ENCOUNTER — Encounter: Payer: Self-pay | Admitting: Hematology

## 2023-04-16 LAB — MULTIPLE MYELOMA PANEL, SERUM
Albumin SerPl Elph-Mcnc: 3.3 g/dL (ref 2.9–4.4)
Albumin/Glob SerPl: 1.4 (ref 0.7–1.7)
Alpha 1: 0.2 g/dL (ref 0.0–0.4)
Alpha2 Glob SerPl Elph-Mcnc: 0.8 g/dL (ref 0.4–1.0)
B-Globulin SerPl Elph-Mcnc: 0.8 g/dL (ref 0.7–1.3)
Gamma Glob SerPl Elph-Mcnc: 0.5 g/dL (ref 0.4–1.8)
Globulin, Total: 2.4 g/dL (ref 2.2–3.9)
IgA: 152 mg/dL (ref 64–422)
IgG (Immunoglobin G), Serum: 613 mg/dL (ref 586–1602)
IgM (Immunoglobulin M), Srm: 21 mg/dL — ABNORMAL LOW (ref 26–217)
Total Protein ELP: 5.7 g/dL — ABNORMAL LOW (ref 6.0–8.5)

## 2023-04-22 ENCOUNTER — Other Ambulatory Visit: Payer: Self-pay | Admitting: Hematology

## 2023-04-22 DIAGNOSIS — C9000 Multiple myeloma not having achieved remission: Secondary | ICD-10-CM

## 2023-04-23 ENCOUNTER — Inpatient Hospital Stay: Payer: Medicare PPO

## 2023-04-23 ENCOUNTER — Inpatient Hospital Stay: Payer: Medicare PPO | Admitting: Hematology

## 2023-04-23 ENCOUNTER — Inpatient Hospital Stay: Payer: Medicare PPO | Attending: Hematology

## 2023-04-23 ENCOUNTER — Other Ambulatory Visit: Payer: Self-pay

## 2023-04-23 VITALS — BP 111/54 | HR 66 | Resp 17

## 2023-04-23 VITALS — BP 130/69 | HR 71 | Temp 97.5°F | Resp 17 | Ht 59.0 in | Wt 148.8 lb

## 2023-04-23 DIAGNOSIS — Z5112 Encounter for antineoplastic immunotherapy: Secondary | ICD-10-CM | POA: Diagnosis not present

## 2023-04-23 DIAGNOSIS — Z95828 Presence of other vascular implants and grafts: Secondary | ICD-10-CM

## 2023-04-23 DIAGNOSIS — Z79899 Other long term (current) drug therapy: Secondary | ICD-10-CM | POA: Insufficient documentation

## 2023-04-23 DIAGNOSIS — C9000 Multiple myeloma not having achieved remission: Secondary | ICD-10-CM | POA: Diagnosis not present

## 2023-04-23 DIAGNOSIS — Z7189 Other specified counseling: Secondary | ICD-10-CM

## 2023-04-23 LAB — CMP (CANCER CENTER ONLY)
ALT: 11 U/L (ref 0–44)
AST: 10 U/L — ABNORMAL LOW (ref 15–41)
Albumin: 3.9 g/dL (ref 3.5–5.0)
Alkaline Phosphatase: 104 U/L (ref 38–126)
Anion gap: 9 (ref 5–15)
BUN: 13 mg/dL (ref 8–23)
CO2: 15 mmol/L — ABNORMAL LOW (ref 22–32)
Calcium: 7.8 mg/dL — ABNORMAL LOW (ref 8.9–10.3)
Chloride: 113 mmol/L — ABNORMAL HIGH (ref 98–111)
Creatinine: 0.93 mg/dL (ref 0.44–1.00)
GFR, Estimated: >60 mL/min (ref >60)
Glucose, Bld: 185 mg/dL — ABNORMAL HIGH (ref 70–99)
Potassium: 3.1 mmol/L — ABNORMAL LOW (ref 3.5–5.1)
Sodium: 137 mmol/L (ref 135–145)
Total Bilirubin: 0.8 mg/dL (ref 0.3–1.2)
Total Protein: 6.2 g/dL — ABNORMAL LOW (ref 6.5–8.1)

## 2023-04-23 LAB — CBC WITH DIFFERENTIAL (CANCER CENTER ONLY)
Abs Immature Granulocytes: 0.02 10*3/uL (ref 0.00–0.07)
Basophils Absolute: 0 10*3/uL (ref 0.0–0.1)
Basophils Relative: 1 %
Eosinophils Absolute: 0.3 10*3/uL (ref 0.0–0.5)
Eosinophils Relative: 6 %
HCT: 34.6 % — ABNORMAL LOW (ref 36.0–46.0)
Hemoglobin: 11.8 g/dL — ABNORMAL LOW (ref 12.0–15.0)
Immature Granulocytes: 1 %
Lymphocytes Relative: 27 %
Lymphs Abs: 1.2 10*3/uL (ref 0.7–4.0)
MCH: 35.5 pg — ABNORMAL HIGH (ref 26.0–34.0)
MCHC: 34.1 g/dL (ref 30.0–36.0)
MCV: 104.2 fL — ABNORMAL HIGH (ref 80.0–100.0)
Monocytes Absolute: 1.1 10*3/uL — ABNORMAL HIGH (ref 0.1–1.0)
Monocytes Relative: 25 %
Neutro Abs: 1.8 10*3/uL (ref 1.7–7.7)
Neutrophils Relative %: 40 %
Platelet Count: 107 10*3/uL — ABNORMAL LOW (ref 150–400)
RBC: 3.32 MIL/uL — ABNORMAL LOW (ref 3.87–5.11)
RDW: 14.5 % (ref 11.5–15.5)
WBC Count: 4.4 10*3/uL (ref 4.0–10.5)
nRBC: 0 % (ref 0.0–0.2)

## 2023-04-23 MED ORDER — DEXTROSE 5 % IV SOLN
36.0000 mg/m2 | Freq: Once | INTRAVENOUS | Status: AC
  Administered 2023-04-23: 60 mg via INTRAVENOUS
  Filled 2023-04-23: qty 30

## 2023-04-23 MED ORDER — ACETAMINOPHEN 325 MG PO TABS
650.0000 mg | ORAL_TABLET | Freq: Once | ORAL | Status: AC
  Administered 2023-04-23: 650 mg via ORAL
  Filled 2023-04-23: qty 2

## 2023-04-23 MED ORDER — HEPARIN SOD (PORK) LOCK FLUSH 100 UNIT/ML IV SOLN
500.0000 [IU] | Freq: Once | INTRAVENOUS | Status: AC | PRN
  Administered 2023-04-23: 500 [IU]

## 2023-04-23 MED ORDER — FAMOTIDINE 20 MG IN NS 100 ML IVPB
20.0000 mg | Freq: Once | INTRAVENOUS | Status: AC
  Administered 2023-04-23: 20 mg via INTRAVENOUS
  Filled 2023-04-23: qty 100

## 2023-04-23 MED ORDER — SODIUM CHLORIDE 0.9% FLUSH
10.0000 mL | INTRAVENOUS | Status: DC | PRN
  Administered 2023-04-23: 10 mL

## 2023-04-23 MED ORDER — DIPHENHYDRAMINE HCL 25 MG PO CAPS
25.0000 mg | ORAL_CAPSULE | Freq: Once | ORAL | Status: AC
  Administered 2023-04-23: 25 mg via ORAL
  Filled 2023-04-23: qty 1

## 2023-04-23 MED ORDER — SODIUM CHLORIDE 0.9 % IV SOLN: Freq: Once | INTRAVENOUS | Status: AC

## 2023-04-23 MED ORDER — DEXAMETHASONE 4 MG PO TABS
8.0000 mg | ORAL_TABLET | Freq: Once | ORAL | Status: AC
  Administered 2023-04-23: 8 mg via ORAL
  Filled 2023-04-23: qty 2

## 2023-04-23 MED ORDER — SODIUM CHLORIDE 0.9% FLUSH
10.0000 mL | Freq: Once | INTRAVENOUS | Status: AC
  Administered 2023-04-23: 10 mL

## 2023-04-23 MED ORDER — PROCHLORPERAZINE MALEATE 10 MG PO TABS
10.0000 mg | ORAL_TABLET | Freq: Once | ORAL | Status: AC
  Administered 2023-04-23: 10 mg via ORAL
  Filled 2023-04-23: qty 1

## 2023-04-23 MED ORDER — SODIUM CHLORIDE 0.9 % IV SOLN: Freq: Once | INTRAVENOUS | Status: DC

## 2023-04-23 NOTE — Progress Notes (Signed)
  Vitals are within treatment parameters.  Labs reviewed: and are within treatment parameters.  Per physician team, patient is ready for treatment and there are NO modifications to the treatment plan.

## 2023-04-23 NOTE — Patient Instructions (Signed)
[facility]   Discharge Instructions: Thank you for choosing [facility] to provide your oncology and hematology care.   If you have a lab appointment with the Cancer Center, please go directly to the Cancer Center and check in at the registration area.   Wear comfortable clothing and clothing appropriate for easy access to any Portacath or PICC line.   We strive to give you quality time with your provider. You may need to reschedule your appointment if you arrive late (15 or more minutes).  Arriving late affects you and other patients whose appointments are after yours.  Also, if you miss three or more appointments without notifying the office, you may be dismissed from the clinic at the provider's discretion.      For prescription refill requests, have your pharmacy contact our office and allow 72 hours for refills to be completed.    Today you received the following chemotherapy and/or immunotherapy agents: Carfilzomib (Kyprolis)       To help prevent nausea and vomiting after your treatment, we encourage you to take your nausea medication as directed.  BELOW ARE SYMPTOMS THAT SHOULD BE REPORTED IMMEDIATELY: *FEVER GREATER THAN 100.4 F (38 C) OR HIGHER *CHILLS OR SWEATING *NAUSEA AND VOMITING THAT IS NOT CONTROLLED WITH YOUR NAUSEA MEDICATION *UNUSUAL SHORTNESS OF BREATH *UNUSUAL BRUISING OR BLEEDING *URINARY PROBLEMS (pain or burning when urinating, or frequent urination) *BOWEL PROBLEMS (unusual diarrhea, constipation, pain near the anus) TENDERNESS IN MOUTH AND THROAT WITH OR WITHOUT PRESENCE OF ULCERS (sore throat, sores in mouth, or a toothache) UNUSUAL RASH, SWELLING OR PAIN  UNUSUAL VAGINAL DISCHARGE OR ITCHING   Items with * indicate a potential emergency and should be followed up as soon as possible or go to the Emergency Department if any problems should occur.  Please show the CHEMOTHERAPY ALERT CARD or IMMUNOTHERAPY  ALERT CARD at check-in to the Emergency Department and triage nurse.  Should you have questions after your visit or need to cancel or reschedule your appointment, please contact [facility]  Dept: [phone]  and follow the prompts.  Office hours are 8:00 a.m. to 4:30 p.m. Monday - Friday. Please note that voicemails left after 4:00 p.m. may not be returned until the following business day.  We are closed weekends and major holidays. You have access to a nurse at all times for urgent questions. Please call the main number to the clinic Dept: [phone] and follow the prompts.   For any non-urgent questions, you may also contact your provider using MyChart. We now offer e-Visits for anyone 18 and older to request care online for non-urgent symptoms. For details visit [email or web address].   Also download the MyChart app! Go to the app store, search "MyChart", open the app, select [place], and log in with your MyChart username and password. 

## 2023-04-27 ENCOUNTER — Other Ambulatory Visit: Payer: Self-pay

## 2023-05-04 ENCOUNTER — Ambulatory Visit (HOSPITAL_COMMUNITY)
Admission: RE | Admit: 2023-05-04 | Discharge: 2023-05-04 | Disposition: A | Discharge status: Home / Self Care | Payer: Medicare PPO | Source: Ambulatory Visit | Attending: Hematology | Admitting: Hematology

## 2023-05-04 ENCOUNTER — Other Ambulatory Visit: Payer: Self-pay

## 2023-05-04 DIAGNOSIS — R0609 Other forms of dyspnea: Secondary | ICD-10-CM | POA: Diagnosis not present

## 2023-05-04 DIAGNOSIS — E119 Type 2 diabetes mellitus without complications: Secondary | ICD-10-CM | POA: Insufficient documentation

## 2023-05-04 DIAGNOSIS — R0600 Dyspnea, unspecified: Secondary | ICD-10-CM | POA: Diagnosis not present

## 2023-05-04 DIAGNOSIS — C9000 Multiple myeloma not having achieved remission: Secondary | ICD-10-CM | POA: Diagnosis not present

## 2023-05-04 DIAGNOSIS — I10 Essential (primary) hypertension: Secondary | ICD-10-CM | POA: Insufficient documentation

## 2023-05-04 LAB — ECHOCARDIOGRAM COMPLETE
AR max vel: 1.78 cm2
AV Area VTI: 1.83 cm2
AV Area mean vel: 2.01 cm2
AV Mean grad: 5 mmHg
AV Peak grad: 9.5 mmHg
Ao pk vel: 1.54 m/s
Area-P 1/2: 2.57 cm2
S' Lateral: 2.5 cm

## 2023-05-07 ENCOUNTER — Other Ambulatory Visit: Payer: Self-pay

## 2023-05-07 ENCOUNTER — Inpatient Hospital Stay (HOSPITAL_BASED_OUTPATIENT_CLINIC_OR_DEPARTMENT_OTHER): Payer: Medicare PPO | Admitting: Physician Assistant

## 2023-05-07 ENCOUNTER — Inpatient Hospital Stay: Payer: Medicare PPO

## 2023-05-07 VITALS — BP 119/65 | HR 60 | Resp 14

## 2023-05-07 VITALS — BP 110/70 | HR 73 | Temp 97.7°F | Resp 16 | Wt 150.9 lb

## 2023-05-07 DIAGNOSIS — C9000 Multiple myeloma not having achieved remission: Secondary | ICD-10-CM

## 2023-05-07 DIAGNOSIS — Z79899 Other long term (current) drug therapy: Secondary | ICD-10-CM | POA: Diagnosis not present

## 2023-05-07 DIAGNOSIS — Z5112 Encounter for antineoplastic immunotherapy: Secondary | ICD-10-CM | POA: Diagnosis not present

## 2023-05-07 DIAGNOSIS — Z7189 Other specified counseling: Secondary | ICD-10-CM | POA: Diagnosis not present

## 2023-05-07 DIAGNOSIS — C9001 Multiple myeloma in remission: Secondary | ICD-10-CM | POA: Diagnosis not present

## 2023-05-07 DIAGNOSIS — Z95828 Presence of other vascular implants and grafts: Secondary | ICD-10-CM

## 2023-05-07 LAB — CBC WITH DIFFERENTIAL (CANCER CENTER ONLY)
Abs Immature Granulocytes: 0.01 10*3/uL (ref 0.00–0.07)
Basophils Absolute: 0.1 10*3/uL (ref 0.0–0.1)
Basophils Relative: 2 %
Eosinophils Absolute: 0.1 10*3/uL (ref 0.0–0.5)
Eosinophils Relative: 2 %
HCT: 33.6 % — ABNORMAL LOW (ref 36.0–46.0)
Hemoglobin: 11.3 g/dL — ABNORMAL LOW (ref 12.0–15.0)
Immature Granulocytes: 0 %
Lymphocytes Relative: 23 %
Lymphs Abs: 0.8 10*3/uL (ref 0.7–4.0)
MCH: 35.6 pg — ABNORMAL HIGH (ref 26.0–34.0)
MCHC: 33.6 g/dL (ref 30.0–36.0)
MCV: 106 fL — ABNORMAL HIGH (ref 80.0–100.0)
Monocytes Absolute: 0.8 10*3/uL (ref 0.1–1.0)
Monocytes Relative: 23 %
Neutro Abs: 1.7 10*3/uL (ref 1.7–7.7)
Neutrophils Relative %: 50 %
Platelet Count: 143 10*3/uL — ABNORMAL LOW (ref 150–400)
RBC: 3.17 MIL/uL — ABNORMAL LOW (ref 3.87–5.11)
RDW: 14.6 % (ref 11.5–15.5)
WBC Count: 3.4 10*3/uL — ABNORMAL LOW (ref 4.0–10.5)
nRBC: 0 % (ref 0.0–0.2)

## 2023-05-07 LAB — CMP (CANCER CENTER ONLY)
ALT: 10 U/L (ref 0–44)
AST: 11 U/L — ABNORMAL LOW (ref 15–41)
Albumin: 3.8 g/dL (ref 3.5–5.0)
Alkaline Phosphatase: 88 U/L (ref 38–126)
Anion gap: 7 (ref 5–15)
BUN: 12 mg/dL (ref 8–23)
CO2: 21 mmol/L — ABNORMAL LOW (ref 22–32)
Calcium: 8.9 mg/dL (ref 8.9–10.3)
Chloride: 111 mmol/L (ref 98–111)
Creatinine: 0.95 mg/dL (ref 0.44–1.00)
GFR, Estimated: >60 mL/min (ref >60)
Glucose, Bld: 201 mg/dL — ABNORMAL HIGH (ref 70–99)
Potassium: 3.5 mmol/L (ref 3.5–5.1)
Sodium: 139 mmol/L (ref 135–145)
Total Bilirubin: 0.5 mg/dL (ref 0.3–1.2)
Total Protein: 5.8 g/dL — ABNORMAL LOW (ref 6.5–8.1)

## 2023-05-07 MED ORDER — ACETAMINOPHEN 325 MG PO TABS
650.0000 mg | ORAL_TABLET | Freq: Once | ORAL | Status: AC
  Administered 2023-05-07: 650 mg via ORAL
  Filled 2023-05-07: qty 2

## 2023-05-07 MED ORDER — OXYCODONE HCL 10 MG PO TABS
10.0000 mg | ORAL_TABLET | Freq: Four times a day (QID) | ORAL | 0 refills | Status: DC | PRN
Start: 2023-05-07 — End: 2023-06-21

## 2023-05-07 MED ORDER — DEXTROSE 5 % IV SOLN
36.0000 mg/m2 | Freq: Once | INTRAVENOUS | Status: AC
  Administered 2023-05-07: 60 mg via INTRAVENOUS
  Filled 2023-05-07: qty 30

## 2023-05-07 MED ORDER — HEPARIN SOD (PORK) LOCK FLUSH 100 UNIT/ML IV SOLN
500.0000 [IU] | Freq: Once | INTRAVENOUS | Status: AC | PRN
  Administered 2023-05-07: 500 [IU]

## 2023-05-07 MED ORDER — SODIUM CHLORIDE 0.9 % IV SOLN: Freq: Once | INTRAVENOUS | Status: AC

## 2023-05-07 MED ORDER — FAMOTIDINE IN NACL 20-0.9 MG/50ML-% IV SOLN
20.0000 mg | Freq: Once | INTRAVENOUS | Status: AC
  Administered 2023-05-07: 20 mg via INTRAVENOUS
  Filled 2023-05-07: qty 50

## 2023-05-07 MED ORDER — SODIUM CHLORIDE 0.9% FLUSH
10.0000 mL | INTRAVENOUS | Status: DC | PRN
  Administered 2023-05-07: 10 mL

## 2023-05-07 MED ORDER — DIPHENHYDRAMINE HCL 25 MG PO CAPS
25.0000 mg | ORAL_CAPSULE | Freq: Once | ORAL | Status: AC
  Administered 2023-05-07: 25 mg via ORAL
  Filled 2023-05-07: qty 1

## 2023-05-07 MED ORDER — PROCHLORPERAZINE MALEATE 10 MG PO TABS
10.0000 mg | ORAL_TABLET | Freq: Once | ORAL | Status: AC
  Administered 2023-05-07: 10 mg via ORAL
  Filled 2023-05-07: qty 1

## 2023-05-07 MED ORDER — SODIUM CHLORIDE 0.9% FLUSH
10.0000 mL | Freq: Once | INTRAVENOUS | Status: AC
  Administered 2023-05-07: 10 mL

## 2023-05-07 MED ORDER — DEXAMETHASONE 4 MG PO TABS
8.0000 mg | ORAL_TABLET | Freq: Once | ORAL | Status: AC
  Administered 2023-05-07: 8 mg via ORAL
  Filled 2023-05-07: qty 2

## 2023-05-07 NOTE — Progress Notes (Signed)
HEMATOLOGY/ONCOLOGY CLINIC NOTE  Date of Service: 05/07/2023   Patient Care Team: Myrlene Broker, MD as PCP - General (Internal Medicine) Hart Carwin, MD (Inactive) as Consulting Physician (Gastroenterology) Jerene Bears, MD as Consulting Physician (Gynecology) Marcene Corning, MD as Consulting Physician (Orthopedic Surgery) Waymon Budge, MD as Consulting Physician (Pulmonary Disease) Mateo Flow, MD (Ophthalmology)  CHIEF COMPLAINTS/PURPOSE OF CONSULTATION:  Follow-up for continued evaluation and management of multiple myeloma  HISTORY OF PRESENTING ILLNESS:  Please see previous notes for details on initial presentation  Current Treatment: Carfilzomib + Revlimid maintenance.  INTERVAL HISTORY:   Faith Orr is a 82 y.o. female i is here for continued evaluation and management of the patient's multiple myeloma. Patient was last seen by Dr. Candise Che on 04/23/2023.She is unaccompanied for this visit.   Ms. Rudzik reports that she felt better since not receiving Zometa with her last treatment. Her fatigue was less pronounced and she was able to complete her ADLs at baseline. She reports intermittent episodes of back pain that flared up recently lasting approximately 10 days. She took tylenol twice a day with improvement of pain. She denies nausea, vomiting or abdominal pain. She has daily bowel movements with taking Senna daily. She denies easy bruising or signs of bleeding.  She is otherwise feeling well without any concerning symptoms. She denies fevers, chills, sweats, shortness of breath or chest pain. She has no other complaints.  MEDICAL HISTORY:  Past Medical History:  Diagnosis Date   Allergy    seasonal   Asthma    DEPRESSION    DIABETES MELLITUS, TYPE II    Diverticulosis    HYPERLIPIDEMIA    Macular degeneration of left eye    mild, Dr.Hecker   Obesity, unspecified    Osteoarthritis of both knees    OSTEOPENIA    Osteopenia    URINARY  INCONTINENCE     SURGICAL HISTORY: Past Surgical History:  Procedure Laterality Date   CATARACT EXTRACTION Left 05/24/2018   CESAREAN SECTION  01/1973   CYSTOSCOPY/URETEROSCOPY/HOLMIUM LASER/STENT PLACEMENT Right 09/20/2019   Procedure: CYSTOSCOPY/URETEROSCOPY/HOLMIUM LASER/STENT PLACEMENT;  Surgeon: [name], MD;  Location: [facility];  Service: Urology;  Laterality: Right;   FRACTURE SURGERY     IR BONE MARROW BIOPSY & ASPIRATION  02/16/2023   IR IMAGING GUIDED PORT INSERTION  02/20/2019   left wrist surgery  2008   By Dr. Yisroel Ramming   right ankle  1994    SOCIAL HISTORY: Social History   Socioeconomic History   Marital status: Married    Spouse name: Not on file   Number of children: 1   Years of education: Not on file   Highest education level: Not on file  Occupational History    Employer: [employer]  Tobacco Use   Smoking status: Never   Smokeless tobacco: Never   Tobacco comments:    Lives with partner [name]) and son  Vaping Use   Vaping Use: Never used  Substance and Sexual Activity   Alcohol use: No    Alcohol/week: 0.0 standard drinks of alcohol   Drug use: No   Sexual activity: Never    Partners: Female    Birth control/protection: Post-menopausal    Comment: Lives with female partner (annette hicks) and 32 yo son  Other Topics Concern   Not on file  Social History Narrative   Not on file   Social Determinants of Health   Financial Resource Strain: Low  Risk  (02/27/2022)   Overall Financial Resource Strain (CARDIA)    Difficulty of Paying Living Expenses: Not hard at all  Food Insecurity: No Food Insecurity (02/27/2022)   Hunger Vital Sign    Worried About Running Out of Food in the Last Year: Never true    Ran Out of Food in the Last Year: Never true  Transportation Needs: No Transportation Needs (02/27/2022)   PRAPARE - Administrator, Civil Service (Medical): No    Lack of Transportation  (Non-Medical): No  Physical Activity: Patient Declined (02/27/2022)   Exercise Vital Sign    Days of Exercise per Week: Patient declined    Minutes of Exercise per Session: Patient declined  Stress: No Stress Concern Present (02/27/2022)   [employer] of Occupational Health - Occupational Stress Questionnaire    Feeling of Stress : Not at all  Social Connections: Unknown (02/27/2022)   Social Connection and Isolation Panel [NHANES]    Frequency of Communication with Friends and Family: Patient declined    Frequency of Social Gatherings with Friends and Family: Patient declined    Attends Religious Services: Patient declined    Database administrator or Organizations: Patient declined    Attends Banker Meetings: Patient declined    Marital Status: Living with partner  Intimate Partner Violence: Not At Risk (05/08/2021)   Humiliation, Afraid, Rape, and Kick questionnaire    Fear of Current or Ex-Partner: No    Emotionally Abused: No    Physically Abused: No    Sexually Abused: No    FAMILY HISTORY: Family History  Problem Relation Age of Onset   Diabetes Father    Hyperlipidemia Father    Heart disease Father    Cancer Father    Hypertension Father    Colon cancer Paternal Grandmother 11   Osteoporosis Mother    Protein S deficiency Mother    Hyperlipidemia Mother    Multiple sclerosis Daughter    Cancer Other        bladder   Breast cancer Neg Hx     ALLERGIES:  is allergic to levofloxacin, penicillins, aleve [naproxen sodium], and sulfonamide derivatives.  MEDICATIONS:  Current Outpatient Medications  Medication Sig Dispense Refill   Acetaminophen (TYLENOL PO) Take by mouth as needed.     acyclovir (ZOVIRAX) 400 MG tablet Take 1 tablet (400 mg total) by mouth 2 (two) times daily. 60 tablet 5   Blood Glucose Monitoring Suppl (FREESTYLE FREEDOM LITE) W/DEVICE KIT Use to check blood sugars twice a day Dx 250.00 1 each 0   Calcium Carbonate-Vitamin D  600-400 MG-UNIT tablet Take 1 tablet by mouth 2 (two) times daily.     dexamethasone (DECADRON) 4 MG tablet Take 5 tablets (20 mg total) by mouth once a week. On D22 of each cycle of treatment 20 tablet 5   ELIQUIS 2.5 MG TABS tablet TAKE 1 TABLET(2.5 MG) BY MOUTH TWICE DAILY 60 tablet 2   fentaNYL (DURAGESIC) 12 MCG/HR Place 1 patch onto the skin every 3 (three) days. 10 patch 0   fluticasone (FLONASE) 50 MCG/ACT nasal spray Place 1 spray into both nostrils daily. 16 g 2   glucose blood (FREESTYLE LITE) test strip CHECK BLOOD SUGAR TWICE DAILY AS DIRECTED Dx 250.00 180 each 3   Lancets (FREESTYLE) lancets Use twice daily to check sugars. 100 each 11   lenalidomide (REVLIMID) 5 MG capsule TAKE 1 CAPSULE BY MOUTH DAILY  FOR 21 DAYS, THEN 7 DAYS OFF 21  capsule 0   lidocaine-prilocaine (EMLA) cream APPLY 1 APPLICATION TO THE AFFECTED AREA AS NEEDED. USE PRIOR TO PORT ACCESS 30 g 0   meclizine (ANTIVERT) 25 MG tablet Take 1 tablet (25 mg total) by mouth every 8 (eight) hours as needed for dizziness. 20 tablet 0   metFORMIN (GLUCOPHAGE-XR) 500 MG 24 hr tablet TAKE 3 TABLETS BY MOUTH EVERY DAY 90 tablet 3   Multiple Vitamins-Minerals (ICAPS) CAPS Take 1 capsule by mouth daily after breakfast.     ondansetron (ZOFRAN) 8 MG tablet Take 1 tablet (8 mg total) by mouth 2 (two) times daily as needed (Nausea or vomiting). 30 tablet 1   Oxycodone HCl 10 MG TABS Take 1 tablet (10 mg total) by mouth every 6 (six) hours as needed. 90 tablet 0   pantoprazole (PROTONIX) 20 MG tablet TAKE 1 TABLET(20 MG) BY MOUTH DAILY 30 tablet 2   polyethylene glycol (MIRALAX / GLYCOLAX) packet Take 17 g by mouth daily after breakfast.     potassium chloride SA (KLOR-CON M) 20 MEQ tablet TAKE 2 TABLETS BY MOUTH TWICE DAILY 360 tablet 1   prochlorperazine (COMPAZINE) 10 MG tablet Take 1 tablet (10 mg total) by mouth every 6 (six) hours as needed (Nausea or vomiting). 30 tablet 1   senna-docusate (SENNA S) 8.6-50 MG tablet Take 2  tablets by mouth at bedtime. 60 tablet 2   sertraline (ZOLOFT) 100 MG tablet Take 1 tablet (100 mg total) by mouth daily. 90 tablet 3   simvastatin (ZOCOR) 20 MG tablet TAKE 1 TABLET BY MOUTH EVERY DAY AT 6 PM Follow-up appt due must see provider for future refills 90 tablet 0   Vitamin D, Ergocalciferol, (DRISDOL) 1.25 MG (50000 UNIT) CAPS capsule TAKE 1 CAPSULE BY MOUTH EVERY 7 DAYS 12 capsule 0   No current facility-administered medications for this visit.   Facility-Administered Medications Ordered in Other Visits  Medication Dose Route Frequency Provider Last Rate Last Admin   heparin lock flush 100 unit/mL  500 Units Intracatheter Once PRN Johney Maine, MD       heparin lock flush 100 unit/mL  500 Units Intracatheter Once PRN Johney Maine, MD       sodium chloride flush (NS) 0.9 % injection 10 mL  10 mL Intracatheter PRN Johney Maine, MD        REVIEW OF SYSTEMS:   10 Point review of Systems was done is negative except as noted above.  PHYSICAL EXAMINATION: ECOG FS:1 - Symptomatic but completely ambulatory  Vitals:   05/07/23 0951  BP: 110/70  Pulse: 73  Resp: 16  Temp: 97.7 F (36.5 C)  SpO2: 99%    Wt Readings from Last 3 Encounters:  05/07/23 150 lb 14.4 oz (68.4 kg)  04/23/23 148 lb 12.8 oz (67.5 kg)  04/09/23 149 lb 8 oz (67.8 kg)   Body mass index is 30.48 kg/m.    NAD GENERAL:alert, in no acute distress and comfortable SKIN: no acute rashes, no significant lesions EYES: conjunctiva are pink and non-injected, sclera anicteric LYMPH:  no palpable lymphadenopathy in the cervical regions LUNGS: clear to auscultation b/l with normal respiratory effort HEART: regular rate & rhythm Extremity: no pedal edema PSYCH: alert & oriented x 3 with fluent speech NEURO: no focal motor/sensory deficits  .Marland Kitchen    Latest Ref Rng & Units 05/07/2023    9:28 AM 04/23/2023    8:58 AM 04/09/2023   11:21 AM  CBC  WBC 4.0 - 10.5 K/uL 3.4  4.4  3.1    Hemoglobin 12.0 - 15.0 g/dL 16.1  09.6  04.5   Hematocrit 36.0 - 46.0 % 33.6  34.6  33.0   Platelets 150 - 400 K/uL 143  107  158     . CBC    Component Value Date/Time   WBC 3.4 (L) 05/07/2023 0928   WBC 3.6 (L) 02/16/2023 0930   RBC 3.17 (L) 05/07/2023 0928   HGB 11.3 (L) 05/07/2023 0928   HCT 33.6 (L) 05/07/2023 0928   PLT 143 (L) 05/07/2023 0928   MCV 106.0 (H) 05/07/2023 0928   MCH 35.6 (H) 05/07/2023 0928   MCHC 33.6 05/07/2023 0928   RDW 14.6 05/07/2023 0928   LYMPHSABS 0.8 05/07/2023 0928   MONOABS 0.8 05/07/2023 0928   EOSABS 0.1 05/07/2023 0928   BASOSABS 0.1 05/07/2023 0928  .    Latest Ref Rng & Units 05/07/2023    9:28 AM 04/23/2023    8:58 AM 04/09/2023   11:21 AM  CMP  Glucose 70 - 99 mg/dL 409  811  914   BUN 8 - 23 mg/dL 12  13  10    Creatinine 0.44 - 1.00 mg/dL 7.82  9.56  2.13   Sodium 135 - 145 mmol/L 139  137  138   Potassium 3.5 - 5.1 mmol/L 3.5  3.1  3.1   Chloride 98 - 111 mmol/L 111  113  111   CO2 22 - 32 mmol/L 21  15  19    Calcium 8.9 - 10.3 mg/dL 8.9  7.8  8.7   Total Protein 6.5 - 8.1 g/dL 5.8  6.2  5.9   Total Bilirubin 0.3 - 1.2 mg/dL 0.5  0.8  0.6   Alkaline Phos 38 - 126 U/L 88  104  100   AST 15 - 41 U/L 11  10  10    ALT 0 - 44 U/L 10  11  8       09/18/2019 BM Bx Report ([id number])   09/18/2019 FISH Panel    05/30/2019 BM Bx   01/06/2019 BM Bx:     01/06/19 Cytogenetics:      05/30/19 BM Biopsy:   09/18/2019 FISH Panel    09/18/2019 BM Surgical Pathology ([id number])     RADIOGRAPHIC STUDIES: I have personally reviewed the radiological images as listed and agreed with the findings in the report. ECHOCARDIOGRAM COMPLETE  Result Date: 05/04/2023    ECHOCARDIOGRAM REPORT   Patient Name:   REAGEN WASIK Barritt Date of Exam: 05/04/2023 Medical Rec #:  [id number]        Height:       59.0 in Accession #:    [id number]       Weight:       148.8 lb Date of Birth:  1941/06/13        BSA:          1.627 m Patient  Age:    81 years         BP:           111/54 mmHg Patient Gender: F                HR:           65 bpm. Exam Location:  Outpatient Procedure: 3D Echo, Cardiac Doppler, Color Doppler and Strain Analysis Indications:    Dyspnea  History:        Patient has no prior history of Echocardiogram examinations.  Risk Factors:Hypertension and Diabetes.  Sonographer:    Darlys Gales Referring Phys: [id number] Johney Maine IMPRESSIONS  1. Left ventricular ejection fraction, by estimation, is 60 to 65%. The left ventricle has normal function. The left ventricle has no regional wall motion abnormalities. Left ventricular diastolic parameters are consistent with Grade I diastolic dysfunction (impaired relaxation). The average left ventricular global longitudinal strain is -21.6 %. The global longitudinal strain is normal.  2. Right ventricular systolic function is normal. The right ventricular size is normal. There is normal pulmonary artery systolic pressure. The estimated right ventricular systolic pressure is 29.0 mmHg.  3. Left atrial size was mildly dilated.  4. The mitral valve is grossly normal. Mild mitral valve regurgitation. No evidence of mitral stenosis.  5. The aortic valve is tricuspid. There is mild calcification of the aortic valve. Aortic valve regurgitation is not visualized. Aortic valve sclerosis is present, with no evidence of aortic valve stenosis.  6. The inferior vena cava is normal in size with greater than 50% respiratory variability, suggesting right atrial pressure of 3 mmHg. FINDINGS  Left Ventricle: Left ventricular ejection fraction, by estimation, is 60 to 65%. The left ventricle has normal function. The left ventricle has no regional wall motion abnormalities. The average left ventricular global longitudinal strain is -21.6 %. The global longitudinal strain is normal. 3D ejection fraction reviewed and evaluated as part of the interpretation. Alternate measurement of EF is felt  to be most reflective of LV function. The left ventricular internal cavity size was normal in size. There is no left ventricular hypertrophy. Left ventricular diastolic parameters are consistent with Grade I diastolic dysfunction (impaired relaxation). Right Ventricle: The right ventricular size is normal. No increase in right ventricular wall thickness. Right ventricular systolic function is normal. There is normal pulmonary artery systolic pressure. The tricuspid regurgitant velocity is 2.55 m/s, and  with an assumed right atrial pressure of 3 mmHg, the estimated right ventricular systolic pressure is 29.0 mmHg. Left Atrium: Left atrial size was mildly dilated. Right Atrium: Right atrial size was normal in size. Pericardium: There is no evidence of pericardial effusion. Mitral Valve: The mitral valve is grossly normal. Mild mitral valve regurgitation. No evidence of mitral valve stenosis. Tricuspid Valve: The tricuspid valve is grossly normal. Tricuspid valve regurgitation is trivial. No evidence of tricuspid stenosis. Aortic Valve: The aortic valve is tricuspid. There is mild calcification of the aortic valve. Aortic valve regurgitation is not visualized. Aortic valve sclerosis is present, with no evidence of aortic valve stenosis. Aortic valve mean gradient measures 5.0 mmHg. Aortic valve peak gradient measures 9.5 mmHg. Aortic valve area, by VTI measures 1.83 cm. Pulmonic Valve: The pulmonic valve was grossly normal. Pulmonic valve regurgitation is not visualized. No evidence of pulmonic stenosis. Aorta: The aortic root is normal in size and structure. Venous: The inferior vena cava is normal in size with greater than 50% respiratory variability, suggesting right atrial pressure of 3 mmHg. IAS/Shunts: The atrial septum is grossly normal.  LEFT VENTRICLE PLAX 2D LVIDd:         4.20 cm   Diastology LVIDs:         2.50 cm   LV e' medial:    5.33 cm/s LV PW:         1.10 cm   LV E/e' medial:  17.5 LV IVS:         1.20 cm   LV e' lateral:   6.42 cm/s LVOT diam:     1.80  cm   LV E/e' lateral: 14.5 LV SV:         58 LV SV Index:   36        2D Longitudinal Strain LVOT Area:     2.54 cm  2D Strain GLS Avg:     -21.6 %                           3D Volume EF:                          3D EF:        56 %                          LV EDV:       137 ml                          LV ESV:       60 ml                          LV SV:        77 ml RIGHT VENTRICLE RV S prime:     10.90 cm/s LEFT ATRIUM             Index        RIGHT ATRIUM           Index LA Vol (A2C):   43.0 ml 26.44 ml/m  RA Area:     11.70 cm LA Vol (A4C):   57.4 ml 35.29 ml/m  RA Volume:   23.70 ml  14.57 ml/m LA Biplane Vol: 49.8 ml 30.62 ml/m  AORTIC VALVE AV Area (Vmax):    1.78 cm AV Area (Vmean):   2.01 cm AV Area (VTI):     1.83 cm AV Vmax:           154.00 cm/s AV Vmean:          108.000 cm/s AV VTI:            0.316 m AV Peak Grad:      9.5 mmHg AV Mean Grad:      5.0 mmHg LVOT Vmax:         108.00 cm/s LVOT Vmean:        85.300 cm/s LVOT VTI:          0.227 m LVOT/AV VTI ratio: 0.72 MITRAL VALVE                TRICUSPID VALVE MV Area (PHT): 2.57 cm     TR Peak grad:   26.0 mmHg MV Decel Time: 295 msec     TR Vmax:        255.00 cm/s MV E velocity: 93.40 cm/s MV A velocity: 116.00 cm/s  SHUNTS MV E/A ratio:  0.81         Systemic VTI:  0.23 m                             Systemic Diam: 1.80 cm Lennie Odor MD Electronically signed by Lennie Odor MD Signature Date/Time: 05/04/2023/2:12:33 PM    Final      ASSESSMENT & PLAN:  1.  Nonsecretory multiple Myeloma, RISS Stage III  Labs upon initial presentation from  12/08/18, blood counts are normal including WBC at 7.1k, HGB at 13.1, and PLT at 245k. Calcium normal at 10.3. Creatinine normal at 0.63. M spike at 0.5g. 12/13/18 Bone Scan revealed Multifocal uptake throughout the skeleton, consistent with diffuse metastatic disease. Primary tumor is not specified. 2. Uptake in the proximal right femur,  consistent with lytic lesions. 3. Uptake in the ribs bilaterally as described. 4. Lesions in the proximal left humerus. 5. Diffuse uptake throughout the skull consistent with metastatic disease. 6. Right paramedian uptake at the manubrium.  12/13/18 CT Right Femur revealed Numerous lytic lesions involving the right femur and a lytic lesion in the left inferior pubic ramus. Overall appearance is most concerning for multiple myeloma  12/27/18 Pretreatment 24hour UPEP observed an M spike at 18mg , and showed 199mg  total protein/day.  12/27/18 Pretreatment MMP revealed M Protein at 0.5g with IgG Lambda specificity. Kappa:Lambda light chain ratio at 0.13, with Lambda at 40.3. There is less abnormal protein and light chains than I would expect from 30% plasma cells, which suggests hypo-secretory or non-secretory neoplastic plasma cells. Will have an impact in assessing response. 01/05/19 PET/CT revealed Innumerable lytic lesions in the skeleton compatible with myeloma. Most of the larger lesions are hypermetabolic, for example including a left proximal humeral shaft lesion with maximum SUV of 8.1 and a 2.8 cm lesion in the left T9 vertebral body with maximum SUV 5.1. Most of the smaller lytic lesions, and some of the larger lesions, do not demonstrate accentuated metabolic activity. 2. 1.2 cm in short axis lymph node in the left parapharyngeal space is hypermetabolic with maximum SUV 11.8. I do not see a separate mass in the head and neck to give rise to this hypermetabolic lymph node. 3. Mosaic attenuation in the lower lobes, nonspecific possibly from air trapping. 4.  Aortic Atherosclerosis 5. Heterogeneous activity in the liver, making it hard to exclude small liver lesions. Consider hepatic protocol MRI with and without contrast for definitive assessment. Nonobstructive right nephrolithiasis. Old granulomatous disease  01/06/19 Bone Marrow biopsy revealed interstitial increase in plasma cells (28% aspirate, 40%  CD138 immunohistochemistry). Plasma cells negative for light chains consistent with a non or weakly secretory myeloma   01/06/19 Cytogenetics revealed 37% of cells with trisomy 11 or 11q deletion, and 40.5% of cells with 17p mutation  S/p 5 cycles of KRD treatment  05/31/19 BM Biopsy revealed mild atypical plasmacytosis at 5% with polytypic variation.   06/01/19 PET/CT revealed "Dominant lesion in the LEFT humerus is decreased significantly in metabolic activity. Additional hypermetabolic skeletal lytic lesions have decreased in metabolic activity or similar to comparison exam (01/05/2019). No evidence of disease progression. 2. Multiple additional lytic lesions do not have metabolic activity and unchanged. 3. No new skeletal lesions are identified. No soft tissue plasmacytoma identified. 4. Nodule / node in the LEFT parapharyngeal space which is intensely hypermetabolic not changed from prior. 5. New hypermetabolic LEFT lower lobe pulmonary nodule is indeterminate. Recommend close attention on follow-up 6. New obstructive hydronephrosis of the RIGHT kidney related to RIGHT UPJ stone."  09/18/2019 BM Bx Report which revealed "Slightly hypercellular bone marrow for age with trilineage hematopoiesis and 1% plasma cells."  09/14/2019 PET/CT Whole Body Scan ([id number]) which revealed "1. There widespread tiny lytic lesions compatible with multiple myeloma. Index larger lesions are generally similar to the prior exam, with low-grade activity such as the left T9 vertebral body lesion with maximum SUV 4.5. Is mild increase in the activity associated with a mildly sclerotic left proximal  humeral lesion, maximum SUV 4.8 (previously 3.5). 2. At the site of the prior left lower lobe nodule is currently more bandlike thickening, with maximum SUV only 1.9, probably benign, continued surveillance of this region suggested. 3. There several small but hypermetabolic lymph nodes. This includes a left parapharyngeal space  node measuring 1.0 cm with maximum SUV 12.3 (stable); a left level IB lymph node measuring 0.5 cm with maximum SUV 4.8 (slightly larger than prior); and a left inguinal lymph node measuring 0.7 cm in short axis with maximum SUV 6.4 (previously 0.5 cm with maximum SUV 0.6). Significance of these lymph nodes uncertain, surveillance is recommended. 4. New 5 mm left lower lobe subpleural nodule on image 32/8, not appreciably hypermetabolic, surveillance suggested. 5. Focal subcutaneous stranding along the left perineum measuring about 2.6 by 1.1 cm on image 221/4, maximum SUV 12.5. This was not present previously and is most likely inflammatory, although given the notable SUV, surveillance of this region is suggested. 6. Other imaging findings of potential clinical significance: Aortic Atherosclerosis (ICD10-I70.0). Coronary atherosclerosis. Old granulomatous disease. Mild right hydronephrosis due to a 7 mm right UPJ calculus. 2 mm right kidney upper pole nonobstructive renal calculus. Prominent stool throughout the colon favors constipation."  12/19/2019 Thoracic & Lumbar Spine MRI ([id number]) ([id number]) revealed "Suspected myeloma lesions at T9 and S1. No compression deformity or epidural disease.  01/18/2020 PET/CT ([id number]) which revealed "1. Stable lytic lesions throughout the skeleton. The larger lytic lesions which had mild metabolic activity on comparison exam now have background metabolic activity. No evidence of active myeloma. No evidence of progression multiple myeloma.  No plasmacytoma 3. Hypermetabolic nodules in the LEFT neck may be associated deep tissues of the LEFT parotid gland. 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 Heterogeneous liver activity -Seen on 01/05/19 PET/CT -Extra-medullary hematopoiesis vs metabolic liver disease vs hepatic malignancy ?  -01/17/19 MRI Liver revealed  Several appreciable liver lesions all have benign imaging characteristics. No MRI findings of metastatic involvement of the liver. 2. Scattered bony lesions corresponding to the lytic lesions seen at PET-CT, compatible with active myeloma. 3. Aortic Atherosclerosis.  Mild cardiomegaly. 4. Diffuse hepatic steatosis.   3. Left lower lobe pulmonary nodule -First seen on 06/01/19 PET/CT -PET/CT 08/27/2020: No hypermetabolic mediastinal or hilar nodes. No suspicious pulmonary nodules on the CT scan.  4. 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 -Has been stable on last couple of scans. -Being managed conservatively as per patient's preference.  No symptoms from this currently.  PLAN: -Labs done today were discussed in detail with the patient. WBC 3.4, Hgb 11.3, Plt 143K, Creatinine normal. -Continue reduced dose Revlimid at 5 mg 3 weeks on 1 week off -Continue carfilzomib 36 mg/m every 2 weeks for maintenance with same supportive medications -Continue Eliquis 2.5 mg p.o. twice daily for VTE prophylaxis -Continue Zometa every 12 weeks. Last dose was on 03/26/2023. Next dose will be in July 2024.   Follow up: --RTC in 2 weeks for labs with follow up and Carfilzomib  All of the patient's questions were answered with apparent satisfaction. The patient knows to call the clinic with any problems, questions or concerns.   I have spent a total of 30 minutes minutes of face-to-face and non-face-to-face time, preparing to see the patient, performing a medically appropriate examination, counseling and educating the patient, documenting clinical information in the electronic  health record,  and care coordination.   Georga Kaufmann PA-C Dept of Hematology and Oncology [facility] [place] Phone: [phone]

## 2023-05-07 NOTE — Patient Instructions (Signed)
[facility]   Discharge Instructions: Thank you for choosing [facility] to provide your oncology and hematology care.   If you have a lab appointment with the Cancer Center, please go directly to the Cancer Center and check in at the registration area.   Wear comfortable clothing and clothing appropriate for easy access to any Portacath or PICC line.   We strive to give you quality time with your provider. You may need to reschedule your appointment if you arrive late (15 or more minutes).  Arriving late affects you and other patients whose appointments are after yours.  Also, if you miss three or more appointments without notifying the office, you may be dismissed from the clinic at the provider's discretion.      For prescription refill requests, have your pharmacy contact our office and allow 72 hours for refills to be completed.    Today you received the following chemotherapy and/or immunotherapy agents: Carfilzomib (Kyprolis)       To help prevent nausea and vomiting after your treatment, we encourage you to take your nausea medication as directed.  BELOW ARE SYMPTOMS THAT SHOULD BE REPORTED IMMEDIATELY: *FEVER GREATER THAN 100.4 F (38 C) OR HIGHER *CHILLS OR SWEATING *NAUSEA AND VOMITING THAT IS NOT CONTROLLED WITH YOUR NAUSEA MEDICATION *UNUSUAL SHORTNESS OF BREATH *UNUSUAL BRUISING OR BLEEDING *URINARY PROBLEMS (pain or burning when urinating, or frequent urination) *BOWEL PROBLEMS (unusual diarrhea, constipation, pain near the anus) TENDERNESS IN MOUTH AND THROAT WITH OR WITHOUT PRESENCE OF ULCERS (sore throat, sores in mouth, or a toothache) UNUSUAL RASH, SWELLING OR PAIN  UNUSUAL VAGINAL DISCHARGE OR ITCHING   Items with * indicate a potential emergency and should be followed up as soon as possible or go to the Emergency Department if any problems should occur.  Please show the CHEMOTHERAPY ALERT CARD or IMMUNOTHERAPY  ALERT CARD at check-in to the Emergency Department and triage nurse.  Should you have questions after your visit or need to cancel or reschedule your appointment, please contact [place] [facility]  Dept: [phone]  and follow the prompts.  Office hours are 8:00 a.m. to 4:30 p.m. Monday - Friday. Please note that voicemails left after 4:00 p.m. may not be returned until the following business day.  We are closed weekends and major holidays. You have access to a nurse at all times for urgent questions. Please call the main number to the clinic Dept: [phone] and follow the prompts.   For any non-urgent questions, you may also contact your provider using MyChart. We now offer e-Visits for anyone 18 and older to request care online for non-urgent symptoms. For details visit [email or web address].   Also download the MyChart app! Go to the app store, search "MyChart", open the app, select [place], and log in with your MyChart username and password. 

## 2023-05-19 ENCOUNTER — Other Ambulatory Visit: Payer: Self-pay | Admitting: Hematology

## 2023-05-19 ENCOUNTER — Other Ambulatory Visit: Payer: Self-pay

## 2023-05-19 DIAGNOSIS — C9000 Multiple myeloma not having achieved remission: Secondary | ICD-10-CM

## 2023-05-19 DIAGNOSIS — C7951 Secondary malignant neoplasm of bone: Secondary | ICD-10-CM

## 2023-05-19 MED ORDER — LENALIDOMIDE 5 MG PO CAPS
ORAL_CAPSULE | ORAL | 0 refills | Status: DC
Start: 2023-05-19 — End: 2023-06-18

## 2023-05-21 ENCOUNTER — Inpatient Hospital Stay: Payer: Medicare PPO | Attending: Hematology

## 2023-05-21 ENCOUNTER — Inpatient Hospital Stay: Payer: Medicare PPO

## 2023-05-21 ENCOUNTER — Ambulatory Visit: Payer: Medicare PPO | Admitting: Hematology

## 2023-05-21 ENCOUNTER — Other Ambulatory Visit: Payer: Self-pay

## 2023-05-21 ENCOUNTER — Other Ambulatory Visit: Payer: Medicare PPO

## 2023-05-21 VITALS — BP 121/56 | HR 72 | Temp 97.8°F | Resp 16 | Wt 151.5 lb

## 2023-05-21 DIAGNOSIS — Z95828 Presence of other vascular implants and grafts: Secondary | ICD-10-CM

## 2023-05-21 DIAGNOSIS — Z79899 Other long term (current) drug therapy: Secondary | ICD-10-CM | POA: Diagnosis not present

## 2023-05-21 DIAGNOSIS — Z5112 Encounter for antineoplastic immunotherapy: Secondary | ICD-10-CM | POA: Insufficient documentation

## 2023-05-21 DIAGNOSIS — C9000 Multiple myeloma not having achieved remission: Secondary | ICD-10-CM | POA: Diagnosis not present

## 2023-05-21 DIAGNOSIS — Z7189 Other specified counseling: Secondary | ICD-10-CM

## 2023-05-21 LAB — CBC WITH DIFFERENTIAL (CANCER CENTER ONLY)
Abs Immature Granulocytes: 0.01 10*3/uL (ref 0.00–0.07)
Basophils Absolute: 0 10*3/uL (ref 0.0–0.1)
Basophils Relative: 1 %
Eosinophils Absolute: 0.2 10*3/uL (ref 0.0–0.5)
Eosinophils Relative: 7 %
HCT: 32.6 % — ABNORMAL LOW (ref 36.0–46.0)
Hemoglobin: 10.7 g/dL — ABNORMAL LOW (ref 12.0–15.0)
Immature Granulocytes: 0 %
Lymphocytes Relative: 33 %
Lymphs Abs: 0.9 10*3/uL (ref 0.7–4.0)
MCH: 35.3 pg — ABNORMAL HIGH (ref 26.0–34.0)
MCHC: 32.8 g/dL (ref 30.0–36.0)
MCV: 107.6 fL — ABNORMAL HIGH (ref 80.0–100.0)
Monocytes Absolute: 0.7 10*3/uL (ref 0.1–1.0)
Monocytes Relative: 25 %
Neutro Abs: 0.9 10*3/uL — ABNORMAL LOW (ref 1.7–7.7)
Neutrophils Relative %: 34 %
Platelet Count: 91 10*3/uL — ABNORMAL LOW (ref 150–400)
RBC: 3.03 MIL/uL — ABNORMAL LOW (ref 3.87–5.11)
RDW: 14.4 % (ref 11.5–15.5)
WBC Count: 2.7 10*3/uL — ABNORMAL LOW (ref 4.0–10.5)
nRBC: 0 % (ref 0.0–0.2)

## 2023-05-21 LAB — CMP (CANCER CENTER ONLY)
ALT: 13 U/L (ref 0–44)
AST: 13 U/L — ABNORMAL LOW (ref 15–41)
Albumin: 3.6 g/dL (ref 3.5–5.0)
Alkaline Phosphatase: 91 U/L (ref 38–126)
Anion gap: 7 (ref 5–15)
BUN: 11 mg/dL (ref 8–23)
CO2: 19 mmol/L — ABNORMAL LOW (ref 22–32)
Calcium: 8.6 mg/dL — ABNORMAL LOW (ref 8.9–10.3)
Chloride: 113 mmol/L — ABNORMAL HIGH (ref 98–111)
Creatinine: 1.23 mg/dL — ABNORMAL HIGH (ref 0.44–1.00)
GFR, Estimated: 45 mL/min — ABNORMAL LOW (ref >60)
Glucose, Bld: 205 mg/dL — ABNORMAL HIGH (ref 70–99)
Potassium: 3.5 mmol/L (ref 3.5–5.1)
Sodium: 139 mmol/L (ref 135–145)
Total Bilirubin: 0.6 mg/dL (ref 0.3–1.2)
Total Protein: 5.7 g/dL — ABNORMAL LOW (ref 6.5–8.1)

## 2023-05-21 MED ORDER — HEPARIN SOD (PORK) LOCK FLUSH 100 UNIT/ML IV SOLN
500.0000 [IU] | Freq: Once | INTRAVENOUS | Status: AC | PRN
  Administered 2023-05-21: 500 [IU]

## 2023-05-21 MED ORDER — FAMOTIDINE IN NACL 20-0.9 MG/50ML-% IV SOLN
20.0000 mg | Freq: Once | INTRAVENOUS | Status: AC
  Administered 2023-05-21: 20 mg via INTRAVENOUS
  Filled 2023-05-21: qty 50

## 2023-05-21 MED ORDER — SODIUM CHLORIDE 0.9 % IV SOLN: Freq: Once | INTRAVENOUS | Status: AC

## 2023-05-21 MED ORDER — DEXTROSE 5 % IV SOLN
36.0000 mg/m2 | Freq: Once | INTRAVENOUS | Status: AC
  Administered 2023-05-21: 60 mg via INTRAVENOUS
  Filled 2023-05-21: qty 30

## 2023-05-21 MED ORDER — PROCHLORPERAZINE MALEATE 10 MG PO TABS
10.0000 mg | ORAL_TABLET | Freq: Once | ORAL | Status: AC
  Administered 2023-05-21: 10 mg via ORAL
  Filled 2023-05-21: qty 1

## 2023-05-21 MED ORDER — ACETAMINOPHEN 325 MG PO TABS
650.0000 mg | ORAL_TABLET | Freq: Once | ORAL | Status: AC
  Administered 2023-05-21: 650 mg via ORAL
  Filled 2023-05-21: qty 2

## 2023-05-21 MED ORDER — SODIUM CHLORIDE 0.9% FLUSH
10.0000 mL | INTRAVENOUS | Status: DC | PRN
  Administered 2023-05-21: 10 mL

## 2023-05-21 MED ORDER — DEXAMETHASONE 4 MG PO TABS
8.0000 mg | ORAL_TABLET | Freq: Once | ORAL | Status: AC
  Administered 2023-05-21: 8 mg via ORAL
  Filled 2023-05-21: qty 2

## 2023-05-21 MED ORDER — SODIUM CHLORIDE 0.9% FLUSH
10.0000 mL | Freq: Once | INTRAVENOUS | Status: AC
  Administered 2023-05-21: 10 mL

## 2023-05-21 MED ORDER — DIPHENHYDRAMINE HCL 25 MG PO CAPS
25.0000 mg | ORAL_CAPSULE | Freq: Once | ORAL | Status: AC
  Administered 2023-05-21: 25 mg via ORAL
  Filled 2023-05-21: qty 1

## 2023-05-21 NOTE — Progress Notes (Signed)
Dr Candise Che reviewed pt's labs from today. Pt ok for tx.

## 2023-05-21 NOTE — Patient Instructions (Signed)
[place] [facility]   Discharge Instructions: Thank you for choosing [facility] to provide your oncology and hematology care.   If you have a lab appointment with the Cancer Center, please go directly to the Cancer Center and check in at the registration area.   Wear comfortable clothing and clothing appropriate for easy access to any Portacath or PICC line.   We strive to give you quality time with your provider. You may need to reschedule your appointment if you arrive late (15 or more minutes).  Arriving late affects you and other patients whose appointments are after yours.  Also, if you miss three or more appointments without notifying the office, you may be dismissed from the clinic at the provider's discretion.      For prescription refill requests, have your pharmacy contact our office and allow 72 hours for refills to be completed.    Today you received the following chemotherapy and/or immunotherapy agents: Carfilzomib (Kyprolis)       To help prevent nausea and vomiting after your treatment, we encourage you to take your nausea medication as directed.  BELOW ARE SYMPTOMS THAT SHOULD BE REPORTED IMMEDIATELY: *FEVER GREATER THAN 100.4 F (38 C) OR HIGHER *CHILLS OR SWEATING *NAUSEA AND VOMITING THAT IS NOT CONTROLLED WITH YOUR NAUSEA MEDICATION *UNUSUAL SHORTNESS OF BREATH *UNUSUAL BRUISING OR BLEEDING *URINARY PROBLEMS (pain or burning when urinating, or frequent urination) *BOWEL PROBLEMS (unusual diarrhea, constipation, pain near the anus) TENDERNESS IN MOUTH AND THROAT WITH OR WITHOUT PRESENCE OF ULCERS (sore throat, sores in mouth, or a toothache) UNUSUAL RASH, SWELLING OR PAIN  UNUSUAL VAGINAL DISCHARGE OR ITCHING   Items with * indicate a potential emergency and should be followed up as soon as possible or go to the Emergency Department if any problems should occur.  Please show the CHEMOTHERAPY ALERT CARD or IMMUNOTHERAPY  ALERT CARD at check-in to the Emergency Department and triage nurse.  Should you have questions after your visit or need to cancel or reschedule your appointment, please contact [place] [facility]  Dept: [phone]  and follow the prompts.  Office hours are 8:00 a.m. to 4:30 p.m. Monday - Friday. Please note that voicemails left after 4:00 p.m. may not be returned until the following business day.  We are closed weekends and major holidays. You have access to a nurse at all times for urgent questions. Please call the main number to the clinic Dept: [phone] and follow the prompts.   For any non-urgent questions, you may also contact your provider using MyChart. We now offer e-Visits for anyone 18 and older to request care online for non-urgent symptoms. For details visit [email or web address].   Also download the MyChart app! Go to the app store, search "MyChart", open the app, select [place], and log in with your MyChart username and password. 

## 2023-06-02 ENCOUNTER — Encounter: Payer: Self-pay | Admitting: Hematology

## 2023-06-02 NOTE — Progress Notes (Signed)
This encounter was created in error - please disregard.

## 2023-06-04 ENCOUNTER — Other Ambulatory Visit: Payer: Self-pay

## 2023-06-04 ENCOUNTER — Inpatient Hospital Stay: Payer: Medicare PPO

## 2023-06-04 ENCOUNTER — Inpatient Hospital Stay: Payer: Medicare PPO | Admitting: Hematology

## 2023-06-04 VITALS — BP 103/56 | HR 57 | Temp 98.6°F | Resp 16

## 2023-06-04 DIAGNOSIS — C9000 Multiple myeloma not having achieved remission: Secondary | ICD-10-CM

## 2023-06-04 DIAGNOSIS — Z5112 Encounter for antineoplastic immunotherapy: Secondary | ICD-10-CM | POA: Diagnosis not present

## 2023-06-04 DIAGNOSIS — Z7189 Other specified counseling: Secondary | ICD-10-CM

## 2023-06-04 DIAGNOSIS — Z95828 Presence of other vascular implants and grafts: Secondary | ICD-10-CM

## 2023-06-04 DIAGNOSIS — Z79899 Other long term (current) drug therapy: Secondary | ICD-10-CM | POA: Diagnosis not present

## 2023-06-04 LAB — CBC WITH DIFFERENTIAL (CANCER CENTER ONLY)
Abs Immature Granulocytes: 0.01 10*3/uL (ref 0.00–0.07)
Basophils Absolute: 0.1 10*3/uL (ref 0.0–0.1)
Basophils Relative: 2 %
Eosinophils Absolute: 0 10*3/uL (ref 0.0–0.5)
Eosinophils Relative: 1 %
HCT: 34.4 % — ABNORMAL LOW (ref 36.0–46.0)
Hemoglobin: 11.3 g/dL — ABNORMAL LOW (ref 12.0–15.0)
Immature Granulocytes: 0 %
Lymphocytes Relative: 29 %
Lymphs Abs: 0.9 10*3/uL (ref 0.7–4.0)
MCH: 35.4 pg — ABNORMAL HIGH (ref 26.0–34.0)
MCHC: 32.8 g/dL (ref 30.0–36.0)
MCV: 107.8 fL — ABNORMAL HIGH (ref 80.0–100.0)
Monocytes Absolute: 0.7 10*3/uL (ref 0.1–1.0)
Monocytes Relative: 22 %
Neutro Abs: 1.4 10*3/uL — ABNORMAL LOW (ref 1.7–7.7)
Neutrophils Relative %: 46 %
Platelet Count: 143 10*3/uL — ABNORMAL LOW (ref 150–400)
RBC: 3.19 MIL/uL — ABNORMAL LOW (ref 3.87–5.11)
RDW: 13.9 % (ref 11.5–15.5)
WBC Count: 3.1 10*3/uL — ABNORMAL LOW (ref 4.0–10.5)
nRBC: 0 % (ref 0.0–0.2)

## 2023-06-04 LAB — CMP (CANCER CENTER ONLY)
ALT: 10 U/L (ref 0–44)
AST: 10 U/L — ABNORMAL LOW (ref 15–41)
Albumin: 3.6 g/dL (ref 3.5–5.0)
Alkaline Phosphatase: 99 U/L (ref 38–126)
Anion gap: 7 (ref 5–15)
BUN: 13 mg/dL (ref 8–23)
CO2: 22 mmol/L (ref 22–32)
Calcium: 9.5 mg/dL (ref 8.9–10.3)
Chloride: 111 mmol/L (ref 98–111)
Creatinine: 1.03 mg/dL — ABNORMAL HIGH (ref 0.44–1.00)
GFR, Estimated: 55 mL/min — ABNORMAL LOW (ref >60)
Glucose, Bld: 218 mg/dL — ABNORMAL HIGH (ref 70–99)
Potassium: 3.5 mmol/L (ref 3.5–5.1)
Sodium: 140 mmol/L (ref 135–145)
Total Bilirubin: 0.7 mg/dL (ref 0.3–1.2)
Total Protein: 5.7 g/dL — ABNORMAL LOW (ref 6.5–8.1)

## 2023-06-04 MED ORDER — DEXTROSE 5 % IV SOLN
36.0000 mg/m2 | Freq: Once | INTRAVENOUS | Status: AC
  Administered 2023-06-04: 60 mg via INTRAVENOUS
  Filled 2023-06-04: qty 30

## 2023-06-04 MED ORDER — SODIUM CHLORIDE 0.9 % IV SOLN: Freq: Once | INTRAVENOUS | Status: AC

## 2023-06-04 MED ORDER — DIPHENHYDRAMINE HCL 25 MG PO CAPS
25.0000 mg | ORAL_CAPSULE | Freq: Once | ORAL | Status: AC
  Administered 2023-06-04: 25 mg via ORAL
  Filled 2023-06-04: qty 1

## 2023-06-04 MED ORDER — PROCHLORPERAZINE MALEATE 10 MG PO TABS
10.0000 mg | ORAL_TABLET | Freq: Once | ORAL | Status: AC
  Administered 2023-06-04: 10 mg via ORAL
  Filled 2023-06-04: qty 1

## 2023-06-04 MED ORDER — SODIUM CHLORIDE 0.9% FLUSH
10.0000 mL | Freq: Once | INTRAVENOUS | Status: AC
  Administered 2023-06-04: 10 mL

## 2023-06-04 MED ORDER — DEXAMETHASONE 4 MG PO TABS
8.0000 mg | ORAL_TABLET | Freq: Once | ORAL | Status: AC
  Administered 2023-06-04: 8 mg via ORAL
  Filled 2023-06-04: qty 2

## 2023-06-04 MED ORDER — HEPARIN SOD (PORK) LOCK FLUSH 100 UNIT/ML IV SOLN
500.0000 [IU] | Freq: Once | INTRAVENOUS | Status: AC | PRN
  Administered 2023-06-04: 500 [IU]

## 2023-06-04 MED ORDER — SODIUM CHLORIDE 0.9% FLUSH
10.0000 mL | INTRAVENOUS | Status: DC | PRN
  Administered 2023-06-04: 10 mL

## 2023-06-04 MED ORDER — ACETAMINOPHEN 325 MG PO TABS
650.0000 mg | ORAL_TABLET | Freq: Once | ORAL | Status: AC
  Administered 2023-06-04: 650 mg via ORAL
  Filled 2023-06-04: qty 2

## 2023-06-04 MED ORDER — FAMOTIDINE IN NACL 20-0.9 MG/50ML-% IV SOLN
20.0000 mg | Freq: Once | INTRAVENOUS | Status: AC
  Administered 2023-06-04: 20 mg via INTRAVENOUS
  Filled 2023-06-04: qty 50

## 2023-06-04 NOTE — Progress Notes (Signed)
HEMATOLOGY/ONCOLOGY CLINIC NOTE  Date of Service: 06/04/23   Patient Care Team: Myrlene Broker, MD as PCP - General (Internal Medicine) Hart Carwin, MD (Inactive) as Consulting Physician (Gastroenterology) Jerene Bears, MD as Consulting Physician (Gynecology) Marcene Corning, MD as Consulting Physician (Orthopedic Surgery) Waymon Budge, MD as Consulting Physician (Pulmonary Disease) Mateo Flow, MD (Ophthalmology)  CHIEF COMPLAINTS/PURPOSE OF VISIT:  Follow-up for continued evaluation and management of multiple myeloma  HISTORY OF PRESENTING ILLNESS:  Please see previous notes for details on initial presentation  Current Treatment: Carfilzomib + Revlimid maintenance.  INTERVAL HISTORY:   Faith Orr is a 82 y.o. female who is here for continued evaluation and management of the patient's multiple myeloma. Patient was last seen by me on 04/09/2023 and complained of severe generalized fatigue, stable back discomfort, intermittent nausea, and random episodes of vomiting.  Patient was most recently seen by Thayil PA on 05/07/2023 and complained of intermittent back pain, but was otherwise doing well overall.   Today, she reports that she has been feeling well overall since her last visit. She reports that has just finished her week off of Revlimid. She denies any new infection issues or new treatment toxicities  She reports that she did not take any antibiotics prior to her recent dental cleaning. She reports that she is allergic to penicillin.   She complains of lower and upper back discomfort. She denies any abdominal pain or leg swelling. Patient reports that her energy levels have improved.   MEDICAL HISTORY:  Past Medical History:  Diagnosis Date   Allergy    seasonal   Asthma    DEPRESSION    DIABETES MELLITUS, TYPE II    Diverticulosis    HYPERLIPIDEMIA    Macular degeneration of left eye    mild, Dr.Hecker   Obesity, unspecified     Osteoarthritis of both knees    OSTEOPENIA    Osteopenia    URINARY INCONTINENCE     SURGICAL HISTORY: Past Surgical History:  Procedure Laterality Date   CATARACT EXTRACTION Left 05/24/2018   CESAREAN SECTION  01/1973   CYSTOSCOPY/URETEROSCOPY/HOLMIUM LASER/STENT PLACEMENT Right 09/20/2019   Procedure: CYSTOSCOPY/URETEROSCOPY/HOLMIUM LASER/STENT PLACEMENT;  Surgeon: [name], MD;  Location: [facility];  Service: Urology;  Laterality: Right;   FRACTURE SURGERY     IR IMAGING GUIDED PORT INSERTION  02/20/2019   left wrist surgery  2008   By Dr. Yisroel Ramming   right ankle  1994    SOCIAL HISTORY: Social History   Socioeconomic History   Marital status: Married    Spouse name: Not on file   Number of children: 1   Years of education: Not on file   Highest education level: Not on file  Occupational History    Employer: [employer]  Tobacco Use   Smoking status: Never   Smokeless tobacco: Never   Tobacco comments:    Lives with partner [name]) and son  Vaping Use   Vaping Use: Never used  Substance and Sexual Activity   Alcohol use: No    Alcohol/week: 0.0 standard drinks of alcohol   Drug use: No   Sexual activity: Never    Partners: Female    Birth control/protection: Post-menopausal    Comment: Lives with female partner (annette hicks) and 14 yo son  Other Topics Concern   Not on file  Social History Narrative   Not on file   Social Determinants of Health  Financial Resource Strain: Low Risk  (02/27/2022)   Overall Financial Resource Strain (CARDIA)    Difficulty of Paying Living Expenses: Not hard at all  Food Insecurity: No Food Insecurity (02/27/2022)   Hunger Vital Sign    Worried About Running Out of Food in the Last Year: Never true    Ran Out of Food in the Last Year: Never true  Transportation Needs: No Transportation Needs (02/27/2022)   PRAPARE - Administrator, Civil Service (Medical): No    Lack of  Transportation (Non-Medical): No  Physical Activity: Unknown (02/27/2022)   Exercise Vital Sign    Days of Exercise per Week: Patient refused    Minutes of Exercise per Session: Patient refused  Stress: No Stress Concern Present (02/27/2022)   [employer] of Occupational Health - Occupational Stress Questionnaire    Feeling of Stress : Not at all  Social Connections: Unknown (02/27/2022)   Social Connection and Isolation Panel [NHANES]    Frequency of Communication with Friends and Family: Patient refused    Frequency of Social Gatherings with Friends and Family: Patient refused    Attends Religious Services: Patient refused    Active Member of Clubs or Organizations: Patient refused    Attends Banker Meetings: Patient refused    Marital Status: Living with partner  Intimate Partner Violence: Not At Risk (05/08/2021)   Humiliation, Afraid, Rape, and Kick questionnaire    Fear of Current or Ex-Partner: No    Emotionally Abused: No    Physically Abused: No    Sexually Abused: No    FAMILY HISTORY: Family History  Problem Relation Age of Onset   Diabetes Father    Hyperlipidemia Father    Heart disease Father    Cancer Father    Hypertension Father    Colon cancer Paternal Grandmother 60   Osteoporosis Mother    Protein S deficiency Mother    Hyperlipidemia Mother    Multiple sclerosis Daughter    Cancer Other        bladder   Breast cancer Neg Hx     ALLERGIES:  is allergic to levofloxacin, penicillins, aleve [naproxen sodium], and sulfonamide derivatives.  MEDICATIONS:  Current Outpatient Medications  Medication Sig Dispense Refill   acyclovir (ZOVIRAX) 400 MG tablet Take 1 tablet (400 mg total) by mouth 2 (two) times daily. 60 tablet 5   Blood Glucose Monitoring Suppl (FREESTYLE FREEDOM LITE) W/DEVICE KIT Use to check blood sugars twice a day Dx 250.00 1 each 0   Calcium Carbonate-Vitamin D 600-400 MG-UNIT tablet Take 1 tablet by mouth 2 (two) times  daily.     dexamethasone (DECADRON) 4 MG tablet Take 5 tablets (20 mg total) by mouth once a week. On D22 of each cycle of treatment 20 tablet 5   ELIQUIS 2.5 MG TABS tablet TAKE 1 TABLET(2.5 MG) BY MOUTH TWICE DAILY 60 tablet 2   fentaNYL (DURAGESIC) 12 MCG/HR Place 1 patch onto the skin every 3 (three) days. 10 patch 0   fluticasone (FLONASE) 50 MCG/ACT nasal spray Place 1 spray into both nostrils daily. 16 g 2   glucose blood (FREESTYLE LITE) test strip CHECK BLOOD SUGAR TWICE DAILY AS DIRECTED Dx 250.00 180 each 3   Lancets (FREESTYLE) lancets Use twice daily to check sugars. 100 each 11   lenalidomide (REVLIMID) 5 MG capsule TAKE 1 CAPSULE BY MOUTH DAILY  FOR 21 DAYS, THEN 7 DAYS OFF 21 capsule 0   lidocaine-prilocaine (EMLA) cream APPLY 1  APPLICATION TO THE AFFECTED AREA AS NEEDED. USE PRIOR TO PORT ACCESS 30 g 0   meclizine (ANTIVERT) 25 MG tablet Take 1 tablet (25 mg total) by mouth every 8 (eight) hours as needed for dizziness. 20 tablet 0   metFORMIN (GLUCOPHAGE-XR) 500 MG 24 hr tablet Take 3 tablets (1,500 mg total) by mouth daily. Annual appt due in Jan must see provider for future refills 180 tablet 0   Multiple Vitamins-Minerals (ICAPS) CAPS Take 1 capsule by mouth daily after breakfast.     ondansetron (ZOFRAN) 8 MG tablet Take 1 tablet (8 mg total) by mouth 2 (two) times daily as needed (Nausea or vomiting). 30 tablet 1   Oxycodone HCl 10 MG TABS Take 1 tablet (10 mg total) by mouth every 6 (six) hours as needed. 90 tablet 0   pantoprazole (PROTONIX) 20 MG tablet Take 1 tablet (20 mg total) by mouth daily. 30 tablet 2   polyethylene glycol (MIRALAX / GLYCOLAX) packet Take 17 g by mouth daily after breakfast.     potassium chloride SA (KLOR-CON M) 20 MEQ tablet TAKE 2 TABLETS BY MOUTH TWICE DAILY 360 tablet 1   prochlorperazine (COMPAZINE) 10 MG tablet Take 1 tablet (10 mg total) by mouth every 6 (six) hours as needed (Nausea or vomiting). 30 tablet 1   senna-docusate (SENNA S)  8.6-50 MG tablet Take 2 tablets by mouth at bedtime. 60 tablet 2   sertraline (ZOLOFT) 100 MG tablet Take 1 tablet (100 mg total) by mouth daily. 90 tablet 3   simvastatin (ZOCOR) 20 MG tablet TAKE 1 TABLET BY MOUTH EVERY DAY AT 6 PM Follow-up appt due must see provider for future refills 90 tablet 0   Vitamin D, Ergocalciferol, (DRISDOL) 1.25 MG (50000 UNIT) CAPS capsule TAKE 1 CAPSULE BY MOUTH EVERY 7 DAYS 12 capsule 0   No current facility-administered medications for this visit.   Facility-Administered Medications Ordered in Other Visits  Medication Dose Route Frequency Provider Last Rate Last Admin   heparin lock flush 100 unit/mL  500 Units Intracatheter Once PRN Johney Maine, MD       sodium chloride flush (NS) 0.9 % injection 10 mL  10 mL Intracatheter PRN Johney Maine, MD   10 mL at 10/02/19 1524   sodium chloride flush (NS) 0.9 % injection 10 mL  10 mL Intracatheter PRN Johney Maine, MD   10 mL at 12/04/22 1517    REVIEW OF SYSTEMS:    10 Point review of Systems was done is negative except as noted above.   PHYSICAL EXAMINATION: ECOG FS:2 - Symptomatic, <50% confined to bed .BP 131/60 (BP Location: Left Arm, Patient Position: Sitting)   Pulse 72   Temp 97.7 F (36.5 C) (Temporal)   Resp 16   Wt 150 lb 4 oz (68.2 kg)   LMP 10/09/2012   SpO2 100%   BMI 30.35 kg/m    Wt Readings from Last 3 Encounters:  01/15/23 149 lb 8 oz (67.8 kg)  01/01/23 150 lb 12 oz (68.4 kg)  12/18/22 151 lb 14.4 oz (68.9 kg)   Body mass index is 30.35 kg/m.     GENERAL:alert, in no acute distress and comfortable SKIN: no acute rashes, no significant lesions EYES: conjunctiva are pink and non-injected, sclera anicteric OROPHARYNX: MMM, no exudates, no oropharyngeal erythema or ulceration NECK: supple, no JVD LYMPH:  no palpable lymphadenopathy in the cervical, axillary or inguinal regions LUNGS: clear to auscultation b/l with normal respiratory effort HEART:  regular rate & rhythm ABDOMEN:  normoactive bowel sounds , non tender, not distended. Extremity: no pedal edema PSYCH: alert & oriented x 3 with fluent speech NEURO: no focal motor/sensory deficits    LABS     Latest Ref Rng & Units 06/04/2023    9:56 AM 05/21/2023    9:43 AM 05/07/2023    9:28 AM  CBC  WBC 4.0 - 10.5 K/uL 3.1  2.7  3.4   Hemoglobin 12.0 - 15.0 g/dL 16.1  09.6  04.5   Hematocrit 36.0 - 46.0 % 34.4  32.6  33.6   Platelets 150 - 400 K/uL 143  91  143       Latest Ref Rng & Units 06/04/2023    9:56 AM 05/21/2023    9:43 AM 05/07/2023    9:28 AM  CMP  Glucose 70 - 99 mg/dL 409  811  914   BUN 8 - 23 mg/dL 13  11  12    Creatinine 0.44 - 1.00 mg/dL 7.82  9.56  2.13   Sodium 135 - 145 mmol/L 140  139  139   Potassium 3.5 - 5.1 mmol/L 3.5  3.5  3.5   Chloride 98 - 111 mmol/L 111  113  111   CO2 22 - 32 mmol/L 22  19  21    Calcium 8.9 - 10.3 mg/dL 9.5  8.6  8.9   Total Protein 6.5 - 8.1 g/dL 5.7  5.7  5.8   Total Bilirubin 0.3 - 1.2 mg/dL 0.7  0.6  0.5   Alkaline Phos 38 - 126 U/L 99  91  88   AST 15 - 41 U/L 10  13  11    ALT 0 - 44 U/L 10  13  10       09/18/2019 BM Bx Report ([id number])   09/18/2019 FISH Panel    05/30/2019 BM Bx   01/06/2019 BM Bx:     01/06/19 Cytogenetics:      05/30/19 BM Biopsy:   09/18/2019 FISH Panel    09/18/2019 BM Surgical Pathology ([id number])     RADIOGRAPHIC STUDIES: I have personally reviewed the radiological images as listed and agreed with the findings in the report. NM PET Image Restage (PS) Whole Body  Result Date: 01/23/2023 CLINICAL DATA:  Subsequent treatment strategy for nonsecretory multiple myeloma. EXAM: NUCLEAR MEDICINE PET WHOLE BODY TECHNIQUE: 7.5 mCi F-18 FDG was injected intravenously. Full-ring PET imaging was performed from the head to foot after the radiotracer. CT data was obtained and used for attenuation correction and anatomic localization. Fasting blood glucose: 137 mg/dl  COMPARISON:  08/65/7846 PET-CT. FINDINGS: Mediastinal blood pool activity: SUV max 2.9 HEAD/NECK: No hypermetabolic activity in the scalp. Hypermetabolic 1.0 cm high left lateral retropharyngeal lymph node with max SUV 8.8 (series 4/image 43), previously 1.0 cm with max SUV 10.1, mildly decreased in metabolism. Hypermetabolic 0.9 cm left level 2 neck lymph node with max SUV 18.9 (series 4/image 54), previously 0.9 cm with max SUV 16.9, mildly increased in metabolism. No new pathologically enlarged or newly hypermetabolic cervical lymph nodes. Incidental CT findings: Right internal jugular Port-A-Cath terminates at the cavoatrial junction. CHEST: No enlarged or hypermetabolic axillary, mediastinal or hilar lymph nodes. No hypermetabolic pulmonary findings. Incidental CT findings: Coronary atherosclerosis. Atherosclerotic nonaneurysmal thoracic aorta. Stable granulomatous calcified left hilar lymph nodes and posterior left upper lobe calcified 1.1 cm granuloma. ABDOMEN/PELVIS: No abnormal hypermetabolic activity within the liver, pancreas, adrenal glands, or spleen. No hypermetabolic lymph nodes in the abdomen or pelvis.  Incidental CT findings: Simple 1.2 cm posterior right liver dome cyst. Stable granulomatous liver calcifications. Atherosclerotic nonaneurysmal abdominal aorta. SKELETON: Low level FDG uptake with max SUV 2.6 associated with healed anterolateral right fifth rib fracture, decreased from previous max SUV 3.9. Mild hypermetabolism with max SUV 3.7 associated with healed posterior right sixth rib fracture, stable to slightly decreased from previous max SUV 4.0. Hypermetabolism at the left ninth costovertebral junction associated with both degenerative changes with osteophytes and lytic changes in the adjacent T9 vertebral body with max SUV 5.0, previous max SUV 4.6, not substantially changed. No new focal hypermetabolic osseous lesions. Incidental CT findings: Fine salt and pepper lytic changes throughout  skeleton, unchanged on the CT images. EXTREMITIES: Stable low level FDG uptake associated with the mixed sclerotic and lytic proximal left humeral metaphysis lesion with max SUV 2.4, previous max SUV 2.6. Generalized hypermetabolism associated with the proximal and distal marrow spaces of the long bones and of the bilateral pelvic girdle, most pronounced in the right humeral head with max SUV 5.0, previous max SUV 4.1, mildly increased, without appreciable changes on the CT images. No new focal hypermetabolic osseous or extraosseous lesions in the extremities. Incidental CT findings: none IMPRESSION: 1. Mild mixed metabolic changes. 2. Two persistent intensely hypermetabolic left neck lymph nodes, one mildly increased and one mildly decreased in metabolism. No new sites of extraosseous hypermetabolic metastatic disease. 3. Mildly increased generalized hypermetabolism associated with the proximal and distal marrow spaces of the long bones and of the bilateral pelvic girdle, most pronounced in the right humeral head, without appreciable changes on the CT images. Findings are nonspecific and could be due to reactive marrow changes such as due to recent treatment, although active myeloma cannot be excluded. 4. Decreased low level FDG uptake associated with healed right fifth and sixth rib fractures. 5. Stable hypermetabolism at the left ninth costovertebral junction. 6. No new hypermetabolic skeletal lesions. 7.  Aortic Atherosclerosis (ICD10-I70.0). Electronically Signed   By: Delbert Phenix M.D.   On: 01/23/2023 16:12     ASSESSMENT & PLAN:   82 y.o. female with  1.  Nonsecretory multiple Myeloma, RISS Stage III  Labs upon initial presentation from 12/08/18, blood counts are normal including WBC at 7.1k, HGB at 13.1, and PLT at 245k. Calcium normal at 10.3. Creatinine normal at 0.63. M spike at 0.5g. 12/13/18 Bone Scan revealed Multifocal uptake throughout the skeleton, consistent with diffuse metastatic  disease. Primary tumor is not specified. 2. Uptake in the proximal right femur, consistent with lytic lesions. 3. Uptake in the ribs bilaterally as described. 4. Lesions in the proximal left humerus. 5. Diffuse uptake throughout the skull consistent with metastatic disease. 6. Right paramedian uptake at the manubrium.  12/13/18 CT Right Femur revealed Numerous lytic lesions involving the right femur and a lytic lesion in the left inferior pubic ramus. Overall appearance is most concerning for multiple myeloma  12/27/18 Pretreatment 24hour UPEP observed an M spike at 18mg , and showed 199mg  total protein/day.  12/27/18 Pretreatment MMP revealed M Protein at 0.5g with IgG Lambda specificity. Kappa:Lambda light chain ratio at 0.13, with Lambda at 40.3. There is less abnormal protein and light chains than I would expect from 30% plasma cells, which suggests hypo-secretory or non-secretory neoplastic plasma cells. Will have an impact in assessing response. 01/05/19 PET/CT revealed Innumerable lytic lesions in the skeleton compatible with myeloma. Most of the larger lesions are hypermetabolic, for example including a left proximal humeral shaft lesion with maximum SUV of  8.1 and a 2.8 cm lesion in the left T9 vertebral body with maximum SUV 5.1. Most of the smaller lytic lesions, and some of the larger lesions, do not demonstrate accentuated metabolic activity. 2. 1.2 cm in short axis lymph node in the left parapharyngeal space is hypermetabolic with maximum SUV 11.8. I do not see a separate mass in the head and neck to give rise to this hypermetabolic lymph node. 3. Mosaic attenuation in the lower lobes, nonspecific possibly from air trapping. 4.  Aortic Atherosclerosis 5. Heterogeneous activity in the liver, making it hard to exclude small liver lesions. Consider hepatic protocol MRI with and without contrast for definitive assessment. Nonobstructive right nephrolithiasis. Old granulomatous disease  01/06/19 Bone  Marrow biopsy revealed interstitial increase in plasma cells (28% aspirate, 40% CD138 immunohistochemistry). Plasma cells negative for light chains consistent with a non or weakly secretory myeloma   01/06/19 Cytogenetics revealed 37% of cells with trisomy 11 or 11q deletion, and 40.5% of cells with 17p mutation  S/p 5 cycles of KRD treatment  05/31/19 BM Biopsy revealed mild atypical plasmacytosis at 5% with polytypic variation.   06/01/19 PET/CT revealed "Dominant lesion in the LEFT humerus is decreased significantly in metabolic activity. Additional hypermetabolic skeletal lytic lesions have decreased in metabolic activity or similar to comparison exam (01/05/2019). No evidence of disease progression. 2. Multiple additional lytic lesions do not have metabolic activity and unchanged. 3. No new skeletal lesions are identified. No soft tissue plasmacytoma identified. 4. Nodule / node in the LEFT parapharyngeal space which is intensely hypermetabolic not changed from prior. 5. New hypermetabolic LEFT lower lobe pulmonary nodule is indeterminate. Recommend close attention on follow-up 6. New obstructive hydronephrosis of the RIGHT kidney related to RIGHT UPJ stone."  09/18/2019 BM Bx Report which revealed "Slightly hypercellular bone marrow for age with trilineage hematopoiesis and 1% plasma cells."  09/14/2019 PET/CT Whole Body Scan ([id number]) which revealed "1. There widespread tiny lytic lesions compatible with multiple myeloma. Index larger lesions are generally similar to the prior exam, with low-grade activity such as the left T9 vertebral body lesion with maximum SUV 4.5. Is mild increase in the activity associated with a mildly sclerotic left proximal humeral lesion, maximum SUV 4.8 (previously 3.5). 2. At the site of the prior left lower lobe nodule is currently more bandlike thickening, with maximum SUV only 1.9, probably benign, continued surveillance of this region suggested. 3. There several  small but hypermetabolic lymph nodes. This includes a left parapharyngeal space node measuring 1.0 cm with maximum SUV 12.3 (stable); a left level IB lymph node measuring 0.5 cm with maximum SUV 4.8 (slightly larger than prior); and a left inguinal lymph node measuring 0.7 cm in short axis with maximum SUV 6.4 (previously 0.5 cm with maximum SUV 0.6). Significance of these lymph nodes uncertain, surveillance is recommended. 4. New 5 mm left lower lobe subpleural nodule on image 32/8, not appreciably hypermetabolic, surveillance suggested. 5. Focal subcutaneous stranding along the left perineum measuring about 2.6 by 1.1 cm on image 221/4, maximum SUV 12.5. This was not present previously and is most likely inflammatory, although given the notable SUV, surveillance of this region is suggested. 6. Other imaging findings of potential clinical significance: Aortic Atherosclerosis (ICD10-I70.0). Coronary atherosclerosis. Old granulomatous disease. Mild right hydronephrosis due to a 7 mm right UPJ calculus. 2 mm right kidney upper pole nonobstructive renal calculus. Prominent stool throughout the colon favors constipation."  12/19/2019 Thoracic & Lumbar Spine MRI ([id number]) ([id number]) revealed "Suspected myeloma lesions at  T9 and S1. No compression deformity or epidural disease.  01/18/2020 PET/CT ([id number]) which revealed "1. Stable lytic lesions throughout the skeleton. The larger lytic lesions which had mild metabolic activity on comparison exam now have background metabolic activity. No evidence of active myeloma. No evidence of progression multiple myeloma.  No plasmacytoma 3. Hypermetabolic nodules in the LEFT neck may be associated deep tissues of the LEFT parotid gland. 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 Heterogeneous liver activity, as seen on 01/05/19 PET/CT Extra-medullary hematopoiesis vs  metabolic liver disease vs hepatic malignancy ?  01/17/19 MRI Liver revealed Several appreciable liver lesions all have benign imaging characteristics. No MRI findings of metastatic involvement of the liver. 2. Scattered bony lesions corresponding to the lytic lesions seen at PET-CT, compatible with active myeloma. 3. Aortic Atherosclerosis.  Mild cardiomegaly. 4. Diffuse hepatic steatosis.   3. Left lower lobe pulmonary nodule First seen on 06/01/19 PET/CT PET/CT 08/27/2020: No hypermetabolic mediastinal or hilar nodes. No suspicious pulmonary nodules on the CT scan.  4. 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 Being managed conservatively as per patient's preference.  No symptoms from this currently.  5. Mild chronic kidney disease CMP done 04/09/2022 showed creatinine of 1.17 and GFR est of 48 consistent with mild chronic kidney disease.  PLAN:  -Discussed lab results on 06/04/2023 in detail with patient. CBC stable, showed WBC of 3.1K, hemoglobin of 11.3, and platelets of 143K. -platelets and hemoglobin improved -CMP shows glucose at 218, otherwise normal -continue maintenance Carfilzomib and Revlimid for high risk myeloma -continue Zometa dose every 12 weeks -will refill fentanyl patch -would recommend patient to take an antibiotic prior to dental cleaning if she has a port placement -Patient has a dental consultation on July 10th  -if neccessary, may adjust treatments for myeloma based on any dental treatments that patient may require -answered all of patient's questions in detail  FOLLOW UP: Per integrated scheduling  The total time spent in the appointment was 25 minutes* .  All of the patient's questions were answered with apparent satisfaction. The patient knows to call the clinic with any problems, questions or concerns.   Wyvonnia Lora MD MS AAHIVMS [facility] Hematology/Oncology  Physician [facility]  .*Total Encounter Time as defined by the Centers for Medicare and Medicaid Services includes, in addition to the face-to-face time of a patient visit (documented in the note above) non-face-to-face time: obtaining and reviewing outside history, ordering and reviewing medications, tests or procedures, care coordination (communications with other health care professionals or caregivers) and documentation in the medical record.    I,Mitra Faeizi,acting as a Neurosurgeon for Wyvonnia Lora, MD.,have documented all relevant documentation on the behalf of Wyvonnia Lora, MD,as directed by  Wyvonnia Lora, MD while in the presence of Wyvonnia Lora, MD.  .I have reviewed the above documentation for accuracy and completeness, and I agree with the above. Johney Maine MD

## 2023-06-04 NOTE — Progress Notes (Signed)
Patient seen by Dr. Addison Naegeli are within treatment parameters.  Labs reviewed: and are not all within treatment parameters. Dr Candise Che aware ANC: 1.4, Cr: 1.03  Per physician team, patient is ready for treatment and there are NO modifications to the treatment plan.

## 2023-06-04 NOTE — Patient Instructions (Signed)
[facility]   Discharge Instructions: Thank you for choosing [facility] to provide your oncology and hematology care.   If you have a lab appointment with the Cancer Center, please go directly to the Cancer Center and check in at the registration area.   Wear comfortable clothing and clothing appropriate for easy access to any Portacath or PICC line.   We strive to give you quality time with your provider. You may need to reschedule your appointment if you arrive late (15 or more minutes).  Arriving late affects you and other patients whose appointments are after yours.  Also, if you miss three or more appointments without notifying the office, you may be dismissed from the clinic at the provider's discretion.      For prescription refill requests, have your pharmacy contact our office and allow 72 hours for refills to be completed.    Today you received the following chemotherapy and/or immunotherapy agents: Carfilzomib (Kyprolis)       To help prevent nausea and vomiting after your treatment, we encourage you to take your nausea medication as directed.  BELOW ARE SYMPTOMS THAT SHOULD BE REPORTED IMMEDIATELY: *FEVER GREATER THAN 100.4 F (38 C) OR HIGHER *CHILLS OR SWEATING *NAUSEA AND VOMITING THAT IS NOT CONTROLLED WITH YOUR NAUSEA MEDICATION *UNUSUAL SHORTNESS OF BREATH *UNUSUAL BRUISING OR BLEEDING *URINARY PROBLEMS (pain or burning when urinating, or frequent urination) *BOWEL PROBLEMS (unusual diarrhea, constipation, pain near the anus) TENDERNESS IN MOUTH AND THROAT WITH OR WITHOUT PRESENCE OF ULCERS (sore throat, sores in mouth, or a toothache) UNUSUAL RASH, SWELLING OR PAIN  UNUSUAL VAGINAL DISCHARGE OR ITCHING   Items with * indicate a potential emergency and should be followed up as soon as possible or go to the Emergency Department if any problems should occur.  Please show the CHEMOTHERAPY ALERT CARD or IMMUNOTHERAPY  ALERT CARD at check-in to the Emergency Department and triage nurse.  Should you have questions after your visit or need to cancel or reschedule your appointment, please contact [facility]  Dept: [phone]  and follow the prompts.  Office hours are 8:00 a.m. to 4:30 p.m. Monday - Friday. Please note that voicemails left after 4:00 p.m. may not be returned until the following business day.  We are closed weekends and major holidays. You have access to a nurse at all times for urgent questions. Please call the main number to the clinic Dept: [phone] and follow the prompts.   For any non-urgent questions, you may also contact your provider using MyChart. We now offer e-Visits for anyone 18 and older to request care online for non-urgent symptoms. For details visit [email or web address].   Also download the MyChart app! Go to the app store, search "MyChart", open the app, select [facility], and log in with your MyChart username and password. 

## 2023-06-08 ENCOUNTER — Other Ambulatory Visit: Payer: Self-pay

## 2023-06-09 ENCOUNTER — Other Ambulatory Visit: Payer: Self-pay

## 2023-06-09 ENCOUNTER — Other Ambulatory Visit: Payer: Self-pay | Admitting: Hematology

## 2023-06-09 ENCOUNTER — Encounter: Payer: Self-pay | Admitting: Hematology

## 2023-06-09 DIAGNOSIS — C9000 Multiple myeloma not having achieved remission: Secondary | ICD-10-CM

## 2023-06-09 DIAGNOSIS — Z7189 Other specified counseling: Secondary | ICD-10-CM

## 2023-06-10 ENCOUNTER — Encounter: Payer: Self-pay | Admitting: Hematology

## 2023-06-10 MED ORDER — FENTANYL 12 MCG/HR TD PT72
1.0000 | MEDICATED_PATCH | TRANSDERMAL | 0 refills | Status: DC
Start: 2023-06-10 — End: 2023-07-30

## 2023-06-11 ENCOUNTER — Encounter: Payer: Self-pay | Admitting: Hematology

## 2023-06-17 ENCOUNTER — Other Ambulatory Visit: Payer: Self-pay | Admitting: Hematology

## 2023-06-17 DIAGNOSIS — C9000 Multiple myeloma not having achieved remission: Secondary | ICD-10-CM

## 2023-06-18 ENCOUNTER — Inpatient Hospital Stay: Payer: Medicare PPO | Attending: Hematology

## 2023-06-18 ENCOUNTER — Other Ambulatory Visit: Payer: Self-pay

## 2023-06-18 ENCOUNTER — Encounter: Payer: Self-pay | Admitting: Hematology

## 2023-06-18 ENCOUNTER — Inpatient Hospital Stay: Payer: Medicare PPO

## 2023-06-18 VITALS — BP 118/59 | HR 67 | Temp 97.8°F | Resp 16 | Ht 59.0 in | Wt 151.6 lb

## 2023-06-18 DIAGNOSIS — C9000 Multiple myeloma not having achieved remission: Secondary | ICD-10-CM | POA: Diagnosis not present

## 2023-06-18 DIAGNOSIS — Z79899 Other long term (current) drug therapy: Secondary | ICD-10-CM | POA: Diagnosis not present

## 2023-06-18 DIAGNOSIS — Z7189 Other specified counseling: Secondary | ICD-10-CM

## 2023-06-18 DIAGNOSIS — Z5112 Encounter for antineoplastic immunotherapy: Secondary | ICD-10-CM | POA: Insufficient documentation

## 2023-06-18 DIAGNOSIS — Z95828 Presence of other vascular implants and grafts: Secondary | ICD-10-CM

## 2023-06-18 LAB — CBC WITH DIFFERENTIAL (CANCER CENTER ONLY)
Abs Immature Granulocytes: 0.01 10*3/uL (ref 0.00–0.07)
Basophils Absolute: 0 10*3/uL (ref 0.0–0.1)
Basophils Relative: 1 %
Eosinophils Absolute: 0.2 10*3/uL (ref 0.0–0.5)
Eosinophils Relative: 5 %
HCT: 33.5 % — ABNORMAL LOW (ref 36.0–46.0)
Hemoglobin: 11.2 g/dL — ABNORMAL LOW (ref 12.0–15.0)
Immature Granulocytes: 0 %
Lymphocytes Relative: 29 %
Lymphs Abs: 1 10*3/uL (ref 0.7–4.0)
MCH: 35.3 pg — ABNORMAL HIGH (ref 26.0–34.0)
MCHC: 33.4 g/dL (ref 30.0–36.0)
MCV: 105.7 fL — ABNORMAL HIGH (ref 80.0–100.0)
Monocytes Absolute: 0.9 10*3/uL (ref 0.1–1.0)
Monocytes Relative: 27 %
Neutro Abs: 1.3 10*3/uL — ABNORMAL LOW (ref 1.7–7.7)
Neutrophils Relative %: 38 %
Platelet Count: 100 10*3/uL — ABNORMAL LOW (ref 150–400)
RBC: 3.17 MIL/uL — ABNORMAL LOW (ref 3.87–5.11)
RDW: 13.2 % (ref 11.5–15.5)
WBC Count: 3.3 10*3/uL — ABNORMAL LOW (ref 4.0–10.5)
nRBC: 0 % (ref 0.0–0.2)

## 2023-06-18 LAB — CMP (CANCER CENTER ONLY)
ALT: 10 U/L (ref 0–44)
AST: 10 U/L — ABNORMAL LOW (ref 15–41)
Albumin: 3.5 g/dL (ref 3.5–5.0)
Alkaline Phosphatase: 120 U/L (ref 38–126)
Anion gap: 9 (ref 5–15)
BUN: 12 mg/dL (ref 8–23)
CO2: 19 mmol/L — ABNORMAL LOW (ref 22–32)
Calcium: 9.1 mg/dL (ref 8.9–10.3)
Chloride: 111 mmol/L (ref 98–111)
Creatinine: 0.95 mg/dL (ref 0.44–1.00)
GFR, Estimated: >60 mL/min (ref >60)
Glucose, Bld: 181 mg/dL — ABNORMAL HIGH (ref 70–99)
Potassium: 3.4 mmol/L — ABNORMAL LOW (ref 3.5–5.1)
Sodium: 139 mmol/L (ref 135–145)
Total Bilirubin: 0.6 mg/dL (ref 0.3–1.2)
Total Protein: 5.5 g/dL — ABNORMAL LOW (ref 6.5–8.1)

## 2023-06-18 MED ORDER — ACETAMINOPHEN 325 MG PO TABS
650.0000 mg | ORAL_TABLET | Freq: Once | ORAL | Status: AC
  Administered 2023-06-18: 650 mg via ORAL
  Filled 2023-06-18: qty 2

## 2023-06-18 MED ORDER — SODIUM CHLORIDE 0.9 % IV SOLN: Freq: Once | INTRAVENOUS | Status: AC

## 2023-06-18 MED ORDER — FAMOTIDINE IN NACL 20-0.9 MG/50ML-% IV SOLN
20.0000 mg | Freq: Once | INTRAVENOUS | Status: AC
  Administered 2023-06-18: 20 mg via INTRAVENOUS
  Filled 2023-06-18: qty 50

## 2023-06-18 MED ORDER — PROCHLORPERAZINE MALEATE 10 MG PO TABS
10.0000 mg | ORAL_TABLET | Freq: Once | ORAL | Status: AC
  Administered 2023-06-18: 10 mg via ORAL
  Filled 2023-06-18: qty 1

## 2023-06-18 MED ORDER — DIPHENHYDRAMINE HCL 25 MG PO CAPS
25.0000 mg | ORAL_CAPSULE | Freq: Once | ORAL | Status: AC
  Administered 2023-06-18: 25 mg via ORAL
  Filled 2023-06-18: qty 1

## 2023-06-18 MED ORDER — SODIUM CHLORIDE 0.9% FLUSH
10.0000 mL | INTRAVENOUS | Status: DC | PRN
  Administered 2023-06-18: 10 mL

## 2023-06-18 MED ORDER — ZOLEDRONIC ACID 4 MG/100ML IV SOLN: 4.0000 mg | Freq: Once | INTRAVENOUS | Status: DC

## 2023-06-18 MED ORDER — DEXTROSE 5 % IV SOLN
36.0000 mg/m2 | Freq: Once | INTRAVENOUS | Status: AC
  Administered 2023-06-18: 60 mg via INTRAVENOUS
  Filled 2023-06-18: qty 30

## 2023-06-18 MED ORDER — DEXAMETHASONE 4 MG PO TABS
8.0000 mg | ORAL_TABLET | Freq: Once | ORAL | Status: AC
  Administered 2023-06-18: 8 mg via ORAL
  Filled 2023-06-18: qty 2

## 2023-06-18 MED ORDER — SODIUM CHLORIDE 0.9% FLUSH
10.0000 mL | Freq: Once | INTRAVENOUS | Status: AC
  Administered 2023-06-18: 10 mL

## 2023-06-18 MED ORDER — HEPARIN SOD (PORK) LOCK FLUSH 100 UNIT/ML IV SOLN
500.0000 [IU] | Freq: Once | INTRAVENOUS | Status: AC | PRN
  Administered 2023-06-18: 500 [IU]

## 2023-06-18 NOTE — Patient Instructions (Signed)
[facility]   Discharge Instructions: Thank you for choosing [facility] to provide your oncology and hematology care.   If you have a lab appointment with the Cancer Center, please go directly to the Cancer Center and check in at the registration area.   Wear comfortable clothing and clothing appropriate for easy access to any Portacath or PICC line.   We strive to give you quality time with your provider. You may need to reschedule your appointment if you arrive late (15 or more minutes).  Arriving late affects you and other patients whose appointments are after yours.  Also, if you miss three or more appointments without notifying the office, you may be dismissed from the clinic at the provider's discretion.      For prescription refill requests, have your pharmacy contact our office and allow 72 hours for refills to be completed.    Today you received the following chemotherapy and/or immunotherapy agents: Carfilzomib (Kyprolis)       To help prevent nausea and vomiting after your treatment, we encourage you to take your nausea medication as directed.  BELOW ARE SYMPTOMS THAT SHOULD BE REPORTED IMMEDIATELY: *FEVER GREATER THAN 100.4 F (38 C) OR HIGHER *CHILLS OR SWEATING *NAUSEA AND VOMITING THAT IS NOT CONTROLLED WITH YOUR NAUSEA MEDICATION *UNUSUAL SHORTNESS OF BREATH *UNUSUAL BRUISING OR BLEEDING *URINARY PROBLEMS (pain or burning when urinating, or frequent urination) *BOWEL PROBLEMS (unusual diarrhea, constipation, pain near the anus) TENDERNESS IN MOUTH AND THROAT WITH OR WITHOUT PRESENCE OF ULCERS (sore throat, sores in mouth, or a toothache) UNUSUAL RASH, SWELLING OR PAIN  UNUSUAL VAGINAL DISCHARGE OR ITCHING   Items with * indicate a potential emergency and should be followed up as soon as possible or go to the Emergency Department if any problems should occur.  Please show the CHEMOTHERAPY ALERT CARD or IMMUNOTHERAPY  ALERT CARD at check-in to the Emergency Department and triage nurse.  Should you have questions after your visit or need to cancel or reschedule your appointment, please contact [facility]  Dept: [phone]  and follow the prompts.  Office hours are 8:00 a.m. to 4:30 p.m. Monday - Friday. Please note that voicemails left after 4:00 p.m. may not be returned until the following business day.  We are closed weekends and major holidays. You have access to a nurse at all times for urgent questions. Please call the main number to the clinic Dept: [phone] and follow the prompts.   For any non-urgent questions, you may also contact your provider using MyChart. We now offer e-Visits for anyone 18 and older to request care online for non-urgent symptoms. For details visit [email or web address].   Also download the MyChart app! Go to the app store, search "MyChart", open the app, select [facility], and log in with your MyChart username and password. 

## 2023-06-18 NOTE — Progress Notes (Signed)
Per Georga Kaufmann, PA, okay to treat with Neutrophils 1.3.   Zometa postponed dt tooth sensitivity and pending dental appointment next week, 7/10.

## 2023-06-21 ENCOUNTER — Other Ambulatory Visit: Payer: Self-pay

## 2023-06-21 ENCOUNTER — Encounter: Payer: Self-pay | Admitting: Hematology

## 2023-06-21 DIAGNOSIS — C9000 Multiple myeloma not having achieved remission: Secondary | ICD-10-CM

## 2023-06-21 MED ORDER — OXYCODONE HCL 10 MG PO TABS
10.0000 mg | ORAL_TABLET | Freq: Four times a day (QID) | ORAL | 0 refills | Status: DC | PRN
Start: 2023-06-21 — End: 2023-07-29

## 2023-07-02 ENCOUNTER — Other Ambulatory Visit: Payer: Self-pay

## 2023-07-02 ENCOUNTER — Inpatient Hospital Stay: Payer: Medicare PPO

## 2023-07-02 ENCOUNTER — Inpatient Hospital Stay: Payer: Medicare PPO | Admitting: Physician Assistant

## 2023-07-02 VITALS — BP 123/66 | HR 73 | Temp 98.2°F | Resp 17 | Ht 59.0 in | Wt 150.5 lb

## 2023-07-02 VITALS — BP 113/57 | HR 60 | Resp 17

## 2023-07-02 DIAGNOSIS — Z5112 Encounter for antineoplastic immunotherapy: Secondary | ICD-10-CM | POA: Diagnosis not present

## 2023-07-02 DIAGNOSIS — Z7189 Other specified counseling: Secondary | ICD-10-CM

## 2023-07-02 DIAGNOSIS — C9000 Multiple myeloma not having achieved remission: Secondary | ICD-10-CM

## 2023-07-02 DIAGNOSIS — Z79899 Other long term (current) drug therapy: Secondary | ICD-10-CM | POA: Diagnosis not present

## 2023-07-02 DIAGNOSIS — Z95828 Presence of other vascular implants and grafts: Secondary | ICD-10-CM

## 2023-07-02 LAB — CBC WITH DIFFERENTIAL (CANCER CENTER ONLY)
Abs Immature Granulocytes: 0.01 10*3/uL (ref 0.00–0.07)
Basophils Absolute: 0.1 10*3/uL (ref 0.0–0.1)
Basophils Relative: 2 %
Eosinophils Absolute: 0.1 10*3/uL (ref 0.0–0.5)
Eosinophils Relative: 1 %
HCT: 35.6 % — ABNORMAL LOW (ref 36.0–46.0)
Hemoglobin: 12 g/dL (ref 12.0–15.0)
Immature Granulocytes: 0 %
Lymphocytes Relative: 18 %
Lymphs Abs: 0.7 10*3/uL (ref 0.7–4.0)
MCH: 35.3 pg — ABNORMAL HIGH (ref 26.0–34.0)
MCHC: 33.7 g/dL (ref 30.0–36.0)
MCV: 104.7 fL — ABNORMAL HIGH (ref 80.0–100.0)
Monocytes Absolute: 0.8 10*3/uL (ref 0.1–1.0)
Monocytes Relative: 21 %
Neutro Abs: 2.2 10*3/uL (ref 1.7–7.7)
Neutrophils Relative %: 58 %
Platelet Count: 178 10*3/uL (ref 150–400)
RBC: 3.4 MIL/uL — ABNORMAL LOW (ref 3.87–5.11)
RDW: 13.1 % (ref 11.5–15.5)
WBC Count: 3.8 10*3/uL — ABNORMAL LOW (ref 4.0–10.5)
nRBC: 0 % (ref 0.0–0.2)

## 2023-07-02 LAB — CMP (CANCER CENTER ONLY)
ALT: 11 U/L (ref 0–44)
AST: 12 U/L — ABNORMAL LOW (ref 15–41)
Albumin: 3.7 g/dL (ref 3.5–5.0)
Alkaline Phosphatase: 114 U/L (ref 38–126)
Anion gap: 11 (ref 5–15)
BUN: 12 mg/dL (ref 8–23)
CO2: 21 mmol/L — ABNORMAL LOW (ref 22–32)
Calcium: 9.5 mg/dL (ref 8.9–10.3)
Chloride: 108 mmol/L (ref 98–111)
Creatinine: 1.03 mg/dL — ABNORMAL HIGH (ref 0.44–1.00)
GFR, Estimated: 55 mL/min — ABNORMAL LOW (ref >60)
Glucose, Bld: 219 mg/dL — ABNORMAL HIGH (ref 70–99)
Potassium: 3.5 mmol/L (ref 3.5–5.1)
Sodium: 140 mmol/L (ref 135–145)
Total Bilirubin: 0.6 mg/dL (ref 0.3–1.2)
Total Protein: 6.1 g/dL — ABNORMAL LOW (ref 6.5–8.1)

## 2023-07-02 MED ORDER — SODIUM CHLORIDE 0.9 % IV SOLN: Freq: Once | INTRAVENOUS | Status: AC

## 2023-07-02 MED ORDER — PROCHLORPERAZINE MALEATE 10 MG PO TABS
10.0000 mg | ORAL_TABLET | Freq: Once | ORAL | Status: AC
  Administered 2023-07-02: 10 mg via ORAL

## 2023-07-02 MED ORDER — DEXAMETHASONE 4 MG PO TABS
8.0000 mg | ORAL_TABLET | Freq: Once | ORAL | Status: AC
  Administered 2023-07-02: 8 mg via ORAL

## 2023-07-02 MED ORDER — DIPHENHYDRAMINE HCL 25 MG PO CAPS
25.0000 mg | ORAL_CAPSULE | Freq: Once | ORAL | Status: AC
  Administered 2023-07-02: 25 mg via ORAL

## 2023-07-02 MED ORDER — HEPARIN SOD (PORK) LOCK FLUSH 100 UNIT/ML IV SOLN
500.0000 [IU] | Freq: Once | INTRAVENOUS | Status: AC | PRN
  Administered 2023-07-02: 500 [IU]

## 2023-07-02 MED ORDER — DEXTROSE 5 % IV SOLN
36.0000 mg/m2 | Freq: Once | INTRAVENOUS | Status: AC
  Administered 2023-07-02: 60 mg via INTRAVENOUS
  Filled 2023-07-02: qty 30

## 2023-07-02 MED ORDER — ZOLEDRONIC ACID 4 MG/100ML IV SOLN
4.0000 mg | Freq: Once | INTRAVENOUS | Status: AC
  Administered 2023-07-02: 4 mg via INTRAVENOUS

## 2023-07-02 MED ORDER — FAMOTIDINE IN NACL 20-0.9 MG/50ML-% IV SOLN
20.0000 mg | Freq: Once | INTRAVENOUS | Status: AC
  Administered 2023-07-02: 20 mg via INTRAVENOUS

## 2023-07-02 MED ORDER — SODIUM CHLORIDE 0.9% FLUSH
10.0000 mL | INTRAVENOUS | Status: DC | PRN
  Administered 2023-07-02: 10 mL

## 2023-07-02 MED ORDER — SODIUM CHLORIDE 0.9% FLUSH
10.0000 mL | Freq: Once | INTRAVENOUS | Status: AC
  Administered 2023-07-02: 10 mL

## 2023-07-02 MED ORDER — ACETAMINOPHEN 325 MG PO TABS
650.0000 mg | ORAL_TABLET | Freq: Once | ORAL | Status: AC
  Administered 2023-07-02: 650 mg via ORAL

## 2023-07-02 NOTE — Patient Instructions (Signed)
[facility]  Discharge Instructions: Thank you for choosing [facility] to provide your oncology and hematology care.   If you have a lab appointment with the Cancer Center, please go directly to the Cancer Center and check in at the registration area.   Wear comfortable clothing and clothing appropriate for easy access to any Portacath or PICC line.   We strive to give you quality time with your provider. You may need to reschedule your appointment if you arrive late (15 or more minutes).  Arriving late affects you and other patients whose appointments are after yours.  Also, if you miss three or more appointments without notifying the office, you may be dismissed from the clinic at the provider's discretion.      For prescription refill requests, have your pharmacy contact our office and allow 72 hours for refills to be completed.    Today you received the following chemotherapy and/or immunotherapy agents :  Kyprolis   To help prevent nausea and vomiting after your treatment, we encourage you to take your nausea medication as directed.  BELOW ARE SYMPTOMS THAT SHOULD BE REPORTED IMMEDIATELY: *FEVER GREATER THAN 100.4 F (38 C) OR HIGHER *CHILLS OR SWEATING *NAUSEA AND VOMITING THAT IS NOT CONTROLLED WITH YOUR NAUSEA MEDICATION *UNUSUAL SHORTNESS OF BREATH *UNUSUAL BRUISING OR BLEEDING *URINARY PROBLEMS (pain or burning when urinating, or frequent urination) *BOWEL PROBLEMS (unusual diarrhea, constipation, pain near the anus) TENDERNESS IN MOUTH AND THROAT WITH OR WITHOUT PRESENCE OF ULCERS (sore throat, sores in mouth, or a toothache) UNUSUAL RASH, SWELLING OR PAIN  UNUSUAL VAGINAL DISCHARGE OR ITCHING   Items with * indicate a potential emergency and should be followed up as soon as possible or go to the Emergency Department if any problems should occur.  Please show the CHEMOTHERAPY ALERT CARD or IMMUNOTHERAPY ALERT CARD at  check-in to the Emergency Department and triage nurse.  Should you have questions after your visit or need to cancel or reschedule your appointment, please contact [facility]  Dept: [phone]  and follow the prompts.  Office hours are 8:00 a.m. to 4:30 p.m. Monday - Friday. Please note that voicemails left after 4:00 p.m. may not be returned until the following business day.  We are closed weekends and major holidays. You have access to a nurse at all times for urgent questions. Please call the main number to the clinic Dept: [phone] and follow the prompts.   For any non-urgent questions, you may also contact your provider using MyChart. We now offer e-Visits for anyone 18 and older to request care online for non-urgent symptoms. For details visit [email or web address].   Also download the MyChart app! Go to the app store, search "MyChart", open the app, select [facility], and log in with your MyChart username and password.   

## 2023-07-04 ENCOUNTER — Encounter: Payer: Self-pay | Admitting: Hematology

## 2023-07-04 NOTE — Progress Notes (Signed)
HEMATOLOGY/ONCOLOGY CLINIC NOTE  Date of Service: 07/02/23   Patient Care Team: Myrlene Broker, MD as PCP - General (Internal Medicine) Hart Carwin, MD (Inactive) as Consulting Physician (Gastroenterology) Jerene Bears, MD as Consulting Physician (Gynecology) Marcene Corning, MD as Consulting Physician (Orthopedic Surgery) Waymon Budge, MD as Consulting Physician (Pulmonary Disease) Mateo Flow, MD (Ophthalmology)  CHIEF COMPLAINTS/PURPOSE OF VISIT:  Follow-up for continued evaluation and management of multiple myeloma  HISTORY OF PRESENTING ILLNESS:  Please see previous notes for details on initial presentation  Current Treatment: Carfilzomib + Revlimid maintenance.  INTERVAL HISTORY:   Faith Orr is a 82 y.o. female who is here for continued evaluation and management of the patient's multiple myeloma. Patient was last seen by Dr. Candise Che on 06/04/2023. She presents unaccompanied for this visit.   [name] reports that she is tolerating treatment without any significant or new symptoms.  Her energy is overall stable.  She has a good appetite and denies any weight changes.  She denies nausea, vomiting or abdominal pain.  Her bowel habits are unchanged without any recurrent episodes of diarrhea or constipation.  She reports bilateral leg pain that last 4 to 5 days after treatment.  The pain did improve with the prescribed pain medication she is on.  She denies easy bruising or signs of active bleeding.  She denies fevers, chills, sweats, shortness of breath, chest pain or cough.  She has no other complaints.  MEDICAL HISTORY:  Past Medical History:  Diagnosis Date   Allergy    seasonal   Asthma    DEPRESSION    DIABETES MELLITUS, TYPE II    Diverticulosis    HYPERLIPIDEMIA    Macular degeneration of left eye    mild, Dr.Hecker   Obesity, unspecified    Osteoarthritis of both knees    OSTEOPENIA    Osteopenia    URINARY INCONTINENCE      SURGICAL HISTORY: Past Surgical History:  Procedure Laterality Date   CATARACT EXTRACTION Left 05/24/2018   CESAREAN SECTION  01/1973   CYSTOSCOPY/URETEROSCOPY/HOLMIUM LASER/STENT PLACEMENT Right 09/20/2019   Procedure: CYSTOSCOPY/URETEROSCOPY/HOLMIUM LASER/STENT PLACEMENT;  Surgeon: [name], MD;  Location: [facility];  Service: Urology;  Laterality: Right;   FRACTURE SURGERY     IR BONE MARROW BIOPSY & ASPIRATION  02/16/2023   IR IMAGING GUIDED PORT INSERTION  02/20/2019   left wrist surgery  2008   By Dr. Yisroel Ramming   right ankle  1994    SOCIAL HISTORY: Social History   Socioeconomic History   Marital status: Married    Spouse name: Not on file   Number of children: 1   Years of education: Not on file   Highest education level: Not on file  Occupational History    Employer: [employer]  Tobacco Use   Smoking status: Never   Smokeless tobacco: Never   Tobacco comments:    Lives with partner Faith Orr) and son  Vaping Use   Vaping status: Never Used  Substance and Sexual Activity   Alcohol use: No    Alcohol/week: 0.0 standard drinks of alcohol   Drug use: No   Sexual activity: Never    Partners: Female    Birth control/protection: Post-menopausal    Comment: Lives with female partner (Faith Orr) and 31 yo son  Other Topics Concern   Not on file  Social History Narrative   Not on file   Social Determinants of Health  Financial Resource Strain: Low Risk  (02/27/2022)   Overall Financial Resource Strain (CARDIA)    Difficulty of Paying Living Expenses: Not hard at all  Food Insecurity: No Food Insecurity (02/27/2022)   Hunger Vital Sign    Worried About Running Out of Food in the Last Year: Never true    Ran Out of Food in the Last Year: Never true  Transportation Needs: No Transportation Needs (02/27/2022)   PRAPARE - Administrator, Civil Service (Medical): No    Lack of Transportation (Non-Medical): No   Physical Activity: Patient Declined (02/27/2022)   Exercise Vital Sign    Days of Exercise per Week: Patient declined    Minutes of Exercise per Session: Patient declined  Stress: No Stress Concern Present (02/27/2022)   [employer] of Occupational Health - Occupational Stress Questionnaire    Feeling of Stress : Not at all  Social Connections: Unknown (02/27/2022)   Social Connection and Isolation Panel [NHANES]    Frequency of Communication with Friends and Family: Patient declined    Frequency of Social Gatherings with Friends and Family: Patient declined    Attends Religious Services: Patient declined    Database administrator or Organizations: Patient declined    Attends Banker Meetings: Patient declined    Marital Status: Living with partner  Intimate Partner Violence: Not At Risk (05/08/2021)   Humiliation, Afraid, Rape, and Kick questionnaire    Fear of Current or Ex-Partner: No    Emotionally Abused: No    Physically Abused: No    Sexually Abused: No    FAMILY HISTORY: Family History  Problem Relation Age of Onset   Diabetes Father    Hyperlipidemia Father    Heart disease Father    Cancer Father    Hypertension Father    Colon cancer Paternal Grandmother 78   Osteoporosis Mother    Protein S deficiency Mother    Hyperlipidemia Mother    Multiple sclerosis Daughter    Cancer Other        bladder   Breast cancer Neg Hx     ALLERGIES:  is allergic to levofloxacin, penicillins, aleve [naproxen sodium], and sulfonamide derivatives.  MEDICATIONS:  Current Outpatient Medications  Medication Sig Dispense Refill   Acetaminophen (TYLENOL PO) Take by mouth as needed.     acyclovir (ZOVIRAX) 400 MG tablet TAKE 1 TABLET(400 MG) BY MOUTH TWICE DAILY 60 tablet 5   Blood Glucose Monitoring Suppl (FREESTYLE FREEDOM LITE) W/DEVICE KIT Use to check blood sugars twice a day Dx 250.00 1 each 0   Calcium Carbonate-Vitamin D 600-400 MG-UNIT tablet Take 1 tablet  by mouth 2 (two) times daily.     dexamethasone (DECADRON) 4 MG tablet Take 5 tablets (20 mg total) by mouth once a week. On D22 of each cycle of treatment 20 tablet 5   ELIQUIS 2.5 MG TABS tablet TAKE 1 TABLET(2.5 MG) BY MOUTH TWICE DAILY 60 tablet 2   fentaNYL (DURAGESIC) 12 MCG/HR Place 1 patch onto the skin every 3 (three) days. 10 patch 0   fluticasone (FLONASE) 50 MCG/ACT nasal spray Place 1 spray into both nostrils daily. 16 g 2   glucose blood (FREESTYLE LITE) test strip CHECK BLOOD SUGAR TWICE DAILY AS DIRECTED Dx 250.00 180 each 3   Lancets (FREESTYLE) lancets Use twice daily to check sugars. 100 each 11   lenalidomide (REVLIMID) 5 MG capsule TAKE 1 CAPSULE BY MOUTH DAILY  FOR 21 DAYS, THEN 7 DAYS OFF 21  capsule 0   lidocaine-prilocaine (EMLA) cream APPLY 1 APPLICATION TO THE AFFECTED AREA AS NEEDED. USE PRIOR TO PORT ACCESS 30 g 0   meclizine (ANTIVERT) 25 MG tablet Take 1 tablet (25 mg total) by mouth every 8 (eight) hours as needed for dizziness. 20 tablet 0   metFORMIN (GLUCOPHAGE-XR) 500 MG 24 hr tablet TAKE 3 TABLETS BY MOUTH EVERY DAY 90 tablet 3   Multiple Vitamins-Minerals (ICAPS) CAPS Take 1 capsule by mouth daily after breakfast.     ondansetron (ZOFRAN) 8 MG tablet Take 1 tablet (8 mg total) by mouth 2 (two) times daily as needed (Nausea or vomiting). 30 tablet 1   Oxycodone HCl 10 MG TABS Take 1 tablet (10 mg total) by mouth every 6 (six) hours as needed. 90 tablet 0   pantoprazole (PROTONIX) 20 MG tablet TAKE 1 TABLET(20 MG) BY MOUTH DAILY 30 tablet 2   polyethylene glycol (MIRALAX / GLYCOLAX) packet Take 17 g by mouth daily after breakfast.     potassium chloride SA (KLOR-CON M) 20 MEQ tablet TAKE 2 TABLETS BY MOUTH TWICE DAILY 360 tablet 1   senna-docusate (SENNA S) 8.6-50 MG tablet Take 2 tablets by mouth at bedtime. 60 tablet 2   sertraline (ZOLOFT) 100 MG tablet Take 1 tablet (100 mg total) by mouth daily. 90 tablet 3   simvastatin (ZOCOR) 20 MG tablet TAKE 1 TABLET  BY MOUTH EVERY DAY AT 6 PM Follow-up appt due must see provider for future refills 90 tablet 0   Vitamin D, Ergocalciferol, (DRISDOL) 1.25 MG (50000 UNIT) CAPS capsule TAKE 1 CAPSULE BY MOUTH EVERY 7 DAYS 12 capsule 0   prochlorperazine (COMPAZINE) 10 MG tablet Take 1 tablet (10 mg total) by mouth every 6 (six) hours as needed (Nausea or vomiting). (Patient not taking: Reported on 07/02/2023) 30 tablet 1   No current facility-administered medications for this visit.   Facility-Administered Medications Ordered in Other Visits  Medication Dose Route Frequency Provider Last Rate Last Admin   heparin lock flush 100 unit/mL  500 Units Intracatheter Once PRN Johney Maine, MD        REVIEW OF SYSTEMS:    10 Point review of Systems was done is negative except as noted above.   PHYSICAL EXAMINATION:  ECOG FS:1 - Symptomatic but completely ambulatory  .BP 123/66 (BP Location: Left Arm, Patient Position: Sitting)   Pulse 73   Temp 98.2 F (36.8 C) (Oral)   Resp 17   Ht 4\' 11"  (1.499 m)   Wt 150 lb 8 oz (68.3 kg)   LMP 10/09/2012   SpO2 100%   BMI 30.40 kg/m    Wt Readings from Last 3 Encounters:  07/02/23 150 lb 8 oz (68.3 kg)  06/18/23 151 lb 9.6 oz (68.8 kg)  06/04/23 151 lb 3.2 oz (68.6 kg)   Body mass index is 30.4 kg/m.     GENERAL:alert, in no acute distress and comfortable SKIN: no acute rashes, no significant lesions EYES: conjunctiva are pink and non-injected, sclera anicteric LUNGS: clear to auscultation b/l with normal respiratory effort HEART: regular rate & rhythm Extremity: no pedal edema PSYCH: alert & oriented x 3 with fluent speech NEURO: no focal motor/sensory deficits    LABS     Latest Ref Rng & Units 07/02/2023    9:34 AM 06/18/2023    8:39 AM 06/04/2023    9:56 AM  CBC  WBC 4.0 - 10.5 K/uL 3.8  3.3  3.1   Hemoglobin 12.0 - 15.0  g/dL 16.1  09.6  04.5   Hematocrit 36.0 - 46.0 % 35.6  33.5  34.4   Platelets 150 - 400 K/uL 178  100  143        Latest Ref Rng & Units 07/02/2023    9:34 AM 06/18/2023    8:39 AM 06/04/2023    9:56 AM  CMP  Glucose 70 - 99 mg/dL 409  811  914   BUN 8 - 23 mg/dL 12  12  13    Creatinine 0.44 - 1.00 mg/dL 7.82  9.56  2.13   Sodium 135 - 145 mmol/L 140  139  140   Potassium 3.5 - 5.1 mmol/L 3.5  3.4  3.5   Chloride 98 - 111 mmol/L 108  111  111   CO2 22 - 32 mmol/L 21  19  22    Calcium 8.9 - 10.3 mg/dL 9.5  9.1  9.5   Total Protein 6.5 - 8.1 g/dL 6.1  5.5  5.7   Total Bilirubin 0.3 - 1.2 mg/dL 0.6  0.6  0.7   Alkaline Phos 38 - 126 U/L 114  120  99   AST 15 - 41 U/L 12  10  10    ALT 0 - 44 U/L 11  10  10       09/18/2019 BM Bx Report ([id number])   09/18/2019 FISH Panel    05/30/2019 BM Bx   01/06/2019 BM Bx:     01/06/19 Cytogenetics:      05/30/19 BM Biopsy:   09/18/2019 FISH Panel    09/18/2019 BM Surgical Pathology ([id number])     RADIOGRAPHIC STUDIES: I have personally reviewed the radiological images as listed and agreed with the findings in the report. No results found.   ASSESSMENT & PLAN:  Faith Orr is a 82 y.o. female who presents for continued management of multiple myeloma.  1.  Nonsecretory multiple Myeloma, RISS Stage III  Labs upon initial presentation from 12/08/18, blood counts are normal including WBC at 7.1k, HGB at 13.1, and PLT at 245k. Calcium normal at 10.3. Creatinine normal at 0.63. M spike at 0.5g. 12/13/18 Bone Scan revealed Multifocal uptake throughout the skeleton, consistent with diffuse metastatic disease. Primary tumor is not specified. 2. Uptake in the proximal right femur, consistent with lytic lesions. 3. Uptake in the ribs bilaterally as described. 4. Lesions in the proximal left humerus. 5. Diffuse uptake throughout the skull consistent with metastatic disease. 6. Right paramedian uptake at the manubrium.  12/13/18 CT Right Femur revealed Numerous lytic lesions involving the right femur and a lytic lesion in the left  inferior pubic ramus. Overall appearance is most concerning for multiple myeloma  12/27/18 Pretreatment 24hour UPEP observed an M spike at 18mg , and showed 199mg  total protein/day.  12/27/18 Pretreatment MMP revealed M Protein at 0.5g with IgG Lambda specificity. Kappa:Lambda light chain ratio at 0.13, with Lambda at 40.3. There is less abnormal protein and light chains than I would expect from 30% plasma cells, which suggests hypo-secretory or non-secretory neoplastic plasma cells. Will have an impact in assessing response. 01/05/19 PET/CT revealed Innumerable lytic lesions in the skeleton compatible with myeloma. Most of the larger lesions are hypermetabolic, for example including a left proximal humeral shaft lesion with maximum SUV of 8.1 and a 2.8 cm lesion in the left T9 vertebral body with maximum SUV 5.1. Most of the smaller lytic lesions, and some of the larger lesions, do not demonstrate accentuated metabolic activity. 2. 1.2 cm in short axis  lymph node in the left parapharyngeal space is hypermetabolic with maximum SUV 11.8. I do not see a separate mass in the head and neck to give rise to this hypermetabolic lymph node. 3. Mosaic attenuation in the lower lobes, nonspecific possibly from air trapping. 4.  Aortic Atherosclerosis 5. Heterogeneous activity in the liver, making it hard to exclude small liver lesions. Consider hepatic protocol MRI with and without contrast for definitive assessment. Nonobstructive right nephrolithiasis. Old granulomatous disease  01/06/19 Bone Marrow biopsy revealed interstitial increase in plasma cells (28% aspirate, 40% CD138 immunohistochemistry). Plasma cells negative for light chains consistent with a non or weakly secretory myeloma   01/06/19 Cytogenetics revealed 37% of cells with trisomy 11 or 11q deletion, and 40.5% of cells with 17p mutation  S/p 5 cycles of KRD treatment  05/31/19 BM Biopsy revealed mild atypical plasmacytosis at 5% with polytypic variation.    06/01/19 PET/CT revealed "Dominant lesion in the LEFT humerus is decreased significantly in metabolic activity. Additional hypermetabolic skeletal lytic lesions have decreased in metabolic activity or similar to comparison exam (01/05/2019). No evidence of disease progression. 2. Multiple additional lytic lesions do not have metabolic activity and unchanged. 3. No new skeletal lesions are identified. No soft tissue plasmacytoma identified. 4. Nodule / node in the LEFT parapharyngeal space which is intensely hypermetabolic not changed from prior. 5. New hypermetabolic LEFT lower lobe pulmonary nodule is indeterminate. Recommend close attention on follow-up 6. New obstructive hydronephrosis of the RIGHT kidney related to RIGHT UPJ stone."  09/18/2019 BM Bx Report which revealed "Slightly hypercellular bone marrow for age with trilineage hematopoiesis and 1% plasma cells."  09/14/2019 PET/CT Whole Body Scan ([id number]) which revealed "1. There widespread tiny lytic lesions compatible with multiple myeloma. Index larger lesions are generally similar to the prior exam, with low-grade activity such as the left T9 vertebral body lesion with maximum SUV 4.5. Is mild increase in the activity associated with a mildly sclerotic left proximal humeral lesion, maximum SUV 4.8 (previously 3.5). 2. At the site of the prior left lower lobe nodule is currently more bandlike thickening, with maximum SUV only 1.9, probably benign, continued surveillance of this region suggested. 3. There several small but hypermetabolic lymph nodes. This includes a left parapharyngeal space node measuring 1.0 cm with maximum SUV 12.3 (stable); a left level IB lymph node measuring 0.5 cm with maximum SUV 4.8 (slightly larger than prior); and a left inguinal lymph node measuring 0.7 cm in short axis with maximum SUV 6.4 (previously 0.5 cm with maximum SUV 0.6). Significance of these lymph nodes uncertain, surveillance is recommended. 4. New 5 mm  left lower lobe subpleural nodule on image 32/8, not appreciably hypermetabolic, surveillance suggested. 5. Focal subcutaneous stranding along the left perineum measuring about 2.6 by 1.1 cm on image 221/4, maximum SUV 12.5. This was not present previously and is most likely inflammatory, although given the notable SUV, surveillance of this region is suggested. 6. Other imaging findings of potential clinical significance: Aortic Atherosclerosis (ICD10-I70.0). Coronary atherosclerosis. Old granulomatous disease. Mild right hydronephrosis due to a 7 mm right UPJ calculus. 2 mm right kidney upper pole nonobstructive renal calculus. Prominent stool throughout the colon favors constipation."  12/19/2019 Thoracic & Lumbar Spine MRI ([id number]) ([id number]) revealed "Suspected myeloma lesions at T9 and S1. No compression deformity or epidural disease.  01/18/2020 PET/CT ([id number]) which revealed "1. Stable lytic lesions throughout the skeleton. The larger lytic lesions which had mild metabolic activity on comparison exam now have background metabolic activity. No  evidence of active myeloma. No evidence of progression multiple myeloma.  No plasmacytoma 3. Hypermetabolic nodules in the LEFT neck may be associated deep tissues of the LEFT parotid gland. 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  01/17/19 MRI Liver revealed Several appreciable liver lesions all have benign imaging characteristics. No MRI findings of metastatic involvement of the liver. 2. Scattered bony lesions corresponding to the lytic lesions seen at PET-CT, compatible with active myeloma. 3. Aortic Atherosclerosis.  Mild cardiomegaly. 4. Diffuse hepatic steatosis.   3. Left lower lobe pulmonary nodule First  seen on 06/01/19 PET/CT PET/CT 08/27/2020: No hypermetabolic mediastinal or hilar nodes. No suspicious pulmonary nodules on the CT scan.  4. 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 Being managed conservatively as per patient's preference.  No symptoms from this currently.   PLAN: -Reviewed labs from today were reviewed in detail with patient. WBC 3.8, Hgb 12.0, Plt 178K, Creatinine and LFTs overall stable. Most recent myeloma labs from 04/09/2023 show M protein is not detected and normal serum free light chains.  -continue maintenance Carfilzomib and Revlimid for high risk myeloma -continue Zometa dose every 12 weeks, next dose due today.  FOLLOW UP: Per integrated scheduling  All of the patient's questions were answered with apparent satisfaction. The patient knows to call the clinic with any problems, questions or concerns.  I have spent a total of 30 minutes minutes of face-to-face and non-face-to-face time, preparing to see the patient, performing a medically appropriate examination, counseling and educating the patient, documenting clinical information in the electronic health record,and care coordination.   Georga Kaufmann PA-C Dept of Hematology and Oncology [facility] Phone: [phone]

## 2023-07-11 ENCOUNTER — Other Ambulatory Visit: Payer: Self-pay

## 2023-07-14 ENCOUNTER — Other Ambulatory Visit: Payer: Self-pay

## 2023-07-15 ENCOUNTER — Other Ambulatory Visit: Payer: Self-pay

## 2023-07-15 DIAGNOSIS — C9000 Multiple myeloma not having achieved remission: Secondary | ICD-10-CM

## 2023-07-15 MED ORDER — LENALIDOMIDE 5 MG PO CAPS: ORAL_CAPSULE | ORAL | 0 refills | Status: DC

## 2023-07-16 ENCOUNTER — Inpatient Hospital Stay: Payer: Medicare PPO

## 2023-07-16 ENCOUNTER — Other Ambulatory Visit: Payer: Self-pay | Admitting: Hematology and Oncology

## 2023-07-16 ENCOUNTER — Inpatient Hospital Stay: Payer: Medicare PPO | Attending: Hematology

## 2023-07-16 ENCOUNTER — Other Ambulatory Visit: Payer: Self-pay

## 2023-07-16 VITALS — BP 114/59 | HR 62 | Temp 98.0°F | Resp 18 | Wt 150.5 lb

## 2023-07-16 DIAGNOSIS — Z7189 Other specified counseling: Secondary | ICD-10-CM

## 2023-07-16 DIAGNOSIS — Z95828 Presence of other vascular implants and grafts: Secondary | ICD-10-CM

## 2023-07-16 DIAGNOSIS — Z79899 Other long term (current) drug therapy: Secondary | ICD-10-CM | POA: Insufficient documentation

## 2023-07-16 DIAGNOSIS — C9000 Multiple myeloma not having achieved remission: Secondary | ICD-10-CM | POA: Diagnosis not present

## 2023-07-16 DIAGNOSIS — Z5112 Encounter for antineoplastic immunotherapy: Secondary | ICD-10-CM | POA: Diagnosis not present

## 2023-07-16 LAB — CBC WITH DIFFERENTIAL (CANCER CENTER ONLY)
Abs Immature Granulocytes: 0.01 10*3/uL (ref 0.00–0.07)
Basophils Absolute: 0 10*3/uL (ref 0.0–0.1)
Basophils Relative: 1 %
Eosinophils Absolute: 0.2 10*3/uL (ref 0.0–0.5)
Eosinophils Relative: 4 %
HCT: 34.3 % — ABNORMAL LOW (ref 36.0–46.0)
Hemoglobin: 11.4 g/dL — ABNORMAL LOW (ref 12.0–15.0)
Immature Granulocytes: 0 %
Lymphocytes Relative: 23 %
Lymphs Abs: 1 10*3/uL (ref 0.7–4.0)
MCH: 34.8 pg — ABNORMAL HIGH (ref 26.0–34.0)
MCHC: 33.2 g/dL (ref 30.0–36.0)
MCV: 104.6 fL — ABNORMAL HIGH (ref 80.0–100.0)
Monocytes Absolute: 1.1 10*3/uL — ABNORMAL HIGH (ref 0.1–1.0)
Monocytes Relative: 25 %
Neutro Abs: 2.1 10*3/uL (ref 1.7–7.7)
Neutrophils Relative %: 47 %
Platelet Count: 130 10*3/uL — ABNORMAL LOW (ref 150–400)
RBC: 3.28 MIL/uL — ABNORMAL LOW (ref 3.87–5.11)
RDW: 13 % (ref 11.5–15.5)
WBC Count: 4.4 10*3/uL (ref 4.0–10.5)
nRBC: 0 % (ref 0.0–0.2)

## 2023-07-16 LAB — CMP (CANCER CENTER ONLY)
ALT: 12 U/L (ref 0–44)
AST: 10 U/L — ABNORMAL LOW (ref 15–41)
Albumin: 3.7 g/dL (ref 3.5–5.0)
Alkaline Phosphatase: 112 U/L (ref 38–126)
Anion gap: 9 (ref 5–15)
BUN: 15 mg/dL (ref 8–23)
CO2: 21 mmol/L — ABNORMAL LOW (ref 22–32)
Calcium: 9 mg/dL (ref 8.9–10.3)
Chloride: 107 mmol/L (ref 98–111)
Creatinine: 1 mg/dL (ref 0.44–1.00)
GFR, Estimated: 57 mL/min — ABNORMAL LOW (ref >60)
Glucose, Bld: 208 mg/dL — ABNORMAL HIGH (ref 70–99)
Potassium: 3.7 mmol/L (ref 3.5–5.1)
Sodium: 137 mmol/L (ref 135–145)
Total Bilirubin: 0.5 mg/dL (ref 0.3–1.2)
Total Protein: 5.9 g/dL — ABNORMAL LOW (ref 6.5–8.1)

## 2023-07-16 MED ORDER — PROCHLORPERAZINE MALEATE 10 MG PO TABS
10.0000 mg | ORAL_TABLET | Freq: Once | ORAL | Status: AC
  Administered 2023-07-16: 10 mg via ORAL
  Filled 2023-07-16: qty 1

## 2023-07-16 MED ORDER — FAMOTIDINE IN NACL 20-0.9 MG/50ML-% IV SOLN
20.0000 mg | Freq: Once | INTRAVENOUS | Status: AC
  Administered 2023-07-16: 20 mg via INTRAVENOUS
  Filled 2023-07-16: qty 50

## 2023-07-16 MED ORDER — SODIUM CHLORIDE 0.9% FLUSH
10.0000 mL | Freq: Once | INTRAVENOUS | Status: AC
  Administered 2023-07-16: 10 mL

## 2023-07-16 MED ORDER — DEXTROSE 5 % IV SOLN
36.0000 mg/m2 | Freq: Once | INTRAVENOUS | Status: AC
  Administered 2023-07-16: 60 mg via INTRAVENOUS
  Filled 2023-07-16: qty 30

## 2023-07-16 MED ORDER — DIPHENHYDRAMINE HCL 25 MG PO CAPS
25.0000 mg | ORAL_CAPSULE | Freq: Once | ORAL | Status: AC
  Administered 2023-07-16: 25 mg via ORAL
  Filled 2023-07-16: qty 1

## 2023-07-16 MED ORDER — ACETAMINOPHEN 325 MG PO TABS
650.0000 mg | ORAL_TABLET | Freq: Once | ORAL | Status: AC
  Administered 2023-07-16: 650 mg via ORAL
  Filled 2023-07-16: qty 2

## 2023-07-16 MED ORDER — SODIUM CHLORIDE 0.9 % IV SOLN: Freq: Once | INTRAVENOUS | Status: AC

## 2023-07-16 MED ORDER — DEXAMETHASONE 4 MG PO TABS
8.0000 mg | ORAL_TABLET | Freq: Once | ORAL | Status: AC
  Administered 2023-07-16: 8 mg via ORAL
  Filled 2023-07-16: qty 2

## 2023-07-16 NOTE — Patient Instructions (Signed)
[facility]   Discharge Instructions: Thank you for choosing [facility] to provide your oncology and hematology care.   If you have a lab appointment with the Cancer Center, please go directly to the Cancer Center and check in at the registration area.   Wear comfortable clothing and clothing appropriate for easy access to any Portacath or PICC line.   We strive to give you quality time with your provider. You may need to reschedule your appointment if you arrive late (15 or more minutes).  Arriving late affects you and other patients whose appointments are after yours.  Also, if you miss three or more appointments without notifying the office, you may be dismissed from the clinic at the provider's discretion.      For prescription refill requests, have your pharmacy contact our office and allow 72 hours for refills to be completed.    Today you received the following chemotherapy and/or immunotherapy agents: Carfilzomib (Kyprolis)       To help prevent nausea and vomiting after your treatment, we encourage you to take your nausea medication as directed.  BELOW ARE SYMPTOMS THAT SHOULD BE REPORTED IMMEDIATELY: *FEVER GREATER THAN 100.4 F (38 C) OR HIGHER *CHILLS OR SWEATING *NAUSEA AND VOMITING THAT IS NOT CONTROLLED WITH YOUR NAUSEA MEDICATION *UNUSUAL SHORTNESS OF BREATH *UNUSUAL BRUISING OR BLEEDING *URINARY PROBLEMS (pain or burning when urinating, or frequent urination) *BOWEL PROBLEMS (unusual diarrhea, constipation, pain near the anus) TENDERNESS IN MOUTH AND THROAT WITH OR WITHOUT PRESENCE OF ULCERS (sore throat, sores in mouth, or a toothache) UNUSUAL RASH, SWELLING OR PAIN  UNUSUAL VAGINAL DISCHARGE OR ITCHING   Items with * indicate a potential emergency and should be followed up as soon as possible or go to the Emergency Department if any problems should occur.  Please show the CHEMOTHERAPY ALERT CARD or IMMUNOTHERAPY  ALERT CARD at check-in to the Emergency Department and triage nurse.  Should you have questions after your visit or need to cancel or reschedule your appointment, please contact [facility]  Dept: [phone]  and follow the prompts.  Office hours are 8:00 a.m. to 4:30 p.m. Monday - Friday. Please note that voicemails left after 4:00 p.m. may not be returned until the following business day.  We are closed weekends and major holidays. You have access to a nurse at all times for urgent questions. Please call the main number to the clinic Dept: [phone] and follow the prompts.   For any non-urgent questions, you may also contact your provider using MyChart. We now offer e-Visits for anyone 18 and older to request care online for non-urgent symptoms. For details visit [email or web address].   Also download the MyChart app! Go to the app store, search "MyChart", open the app, select [facility], and log in with your MyChart username and password. 

## 2023-07-20 ENCOUNTER — Other Ambulatory Visit: Payer: Self-pay | Admitting: Internal Medicine

## 2023-07-23 ENCOUNTER — Encounter: Payer: Self-pay | Admitting: Internal Medicine

## 2023-07-23 ENCOUNTER — Telehealth (INDEPENDENT_AMBULATORY_CARE_PROVIDER_SITE_OTHER): Payer: Medicare PPO | Admitting: Internal Medicine

## 2023-07-23 DIAGNOSIS — E1169 Type 2 diabetes mellitus with other specified complication: Secondary | ICD-10-CM | POA: Diagnosis not present

## 2023-07-23 DIAGNOSIS — E785 Hyperlipidemia, unspecified: Secondary | ICD-10-CM

## 2023-07-23 DIAGNOSIS — F320 Major depressive disorder, single episode, mild: Secondary | ICD-10-CM

## 2023-07-23 DIAGNOSIS — Z7984 Long term (current) use of oral hypoglycemic drugs: Secondary | ICD-10-CM

## 2023-07-23 DIAGNOSIS — E118 Type 2 diabetes mellitus with unspecified complications: Secondary | ICD-10-CM | POA: Diagnosis not present

## 2023-07-23 DIAGNOSIS — C9000 Multiple myeloma not having achieved remission: Secondary | ICD-10-CM | POA: Diagnosis not present

## 2023-07-23 MED ORDER — METFORMIN HCL ER 500 MG PO TB24: 500.0000 mg | ORAL_TABLET | Freq: Two times a day (BID) | ORAL | 3 refills | Status: DC

## 2023-07-23 MED ORDER — SERTRALINE HCL 100 MG PO TABS: 100.0000 mg | ORAL_TABLET | Freq: Every day | ORAL | 3 refills | Status: DC

## 2023-07-23 MED ORDER — SIMVASTATIN 20 MG PO TABS: 20.0000 mg | ORAL_TABLET | Freq: Every day | ORAL | 3 refills | Status: DC

## 2023-07-23 MED ORDER — FLUTICASONE PROPIONATE 50 MCG/ACT NA SUSP: 1.0000 | Freq: Every day | NASAL | 3 refills | Status: AC

## 2023-07-23 NOTE — Assessment & Plan Note (Signed)
Stable on zoloft 100 mg daily and will refill today. Continue.

## 2023-07-23 NOTE — Assessment & Plan Note (Signed)
Seeing oncology and under active treatment twice a month. She is struggling with pain and is using fentanyl and oxycodone to help with pain. Using bowel regimen for regularity.

## 2023-07-23 NOTE — Progress Notes (Signed)
Virtual Visit via Video Note  I connected with Faith Orr on 07/23/23 at  3:00 PM EDT by a video enabled telemedicine application and verified that I am speaking with the correct person using two identifiers.  The patient and the provider were at separate locations throughout the entire encounter. Patient location: home, Provider location: work   I discussed the limitations of evaluation and management by telemedicine and the availability of in person appointments. The patient expressed understanding and agreed to proceed. The patient and the provider were the only parties present for the visit unless noted in HPI below.  History of Present Illness: The patient is a 82 y.o. female with visit for follow up. More pains in joints with multiple myeloma treatment. Takes bowel regimen to maintain with being on opioids.   Observations/Objective: Appearance: stable, breathing appears normal, casual grooming, mental status is A and O times 3  Assessment and Plan: See problem oriented charting  Follow Up Instructions: refill medications and reach out to oncology to draw HgA1c and lipid with next lab draw orders placed  I discussed the assessment and treatment plan with the patient. The patient was provided an opportunity to ask questions and all were answered. The patient agreed with the plan and demonstrated an understanding of the instructions.   The patient was advised to call back or seek an in-person evaluation if the symptoms worsen or if the condition fails to improve as anticipated.  Myrlene Broker, MD

## 2023-07-23 NOTE — Assessment & Plan Note (Signed)
Will ask oncology to check lipid panel with next lab draw. Orders entered. Adjust simvastatin 20 mg daily as needed for LDL <100.

## 2023-07-23 NOTE — Assessment & Plan Note (Signed)
Ordered HgA1c and lipid panel and microalbumin to creatinine ratio and will ask oncology to draw next lab draw which is next week. She is taking metformin 500 mg BID and not having high or low sugars. On statin.

## 2023-07-27 ENCOUNTER — Other Ambulatory Visit: Payer: Self-pay

## 2023-07-29 ENCOUNTER — Encounter: Payer: Self-pay | Admitting: Hematology

## 2023-07-29 ENCOUNTER — Other Ambulatory Visit: Payer: Self-pay

## 2023-07-29 DIAGNOSIS — C9000 Multiple myeloma not having achieved remission: Secondary | ICD-10-CM

## 2023-07-29 DIAGNOSIS — E876 Hypokalemia: Secondary | ICD-10-CM

## 2023-07-29 MED ORDER — PANTOPRAZOLE SODIUM 20 MG PO TBEC
DELAYED_RELEASE_TABLET | ORAL | 2 refills | Status: DC
Start: 2023-07-29 — End: 2024-08-23

## 2023-07-29 MED ORDER — ONDANSETRON HCL 8 MG PO TABS: 8.0000 mg | ORAL_TABLET | Freq: Two times a day (BID) | ORAL | 1 refills | Status: DC | PRN

## 2023-07-29 MED ORDER — POTASSIUM CHLORIDE CRYS ER 20 MEQ PO TBCR
40.0000 meq | EXTENDED_RELEASE_TABLET | Freq: Two times a day (BID) | ORAL | 1 refills | Status: DC
Start: 2023-07-29 — End: 2024-02-01

## 2023-07-30 ENCOUNTER — Encounter: Payer: Self-pay | Admitting: Hematology

## 2023-07-30 ENCOUNTER — Inpatient Hospital Stay: Payer: Medicare PPO

## 2023-07-30 ENCOUNTER — Other Ambulatory Visit: Payer: Self-pay

## 2023-07-30 ENCOUNTER — Inpatient Hospital Stay: Payer: Medicare PPO | Admitting: Hematology

## 2023-07-30 VITALS — BP 119/64 | HR 71 | Temp 98.0°F | Resp 16 | Ht 59.0 in | Wt 150.8 lb

## 2023-07-30 DIAGNOSIS — Z5111 Encounter for antineoplastic chemotherapy: Secondary | ICD-10-CM

## 2023-07-30 DIAGNOSIS — Z79899 Other long term (current) drug therapy: Secondary | ICD-10-CM | POA: Diagnosis not present

## 2023-07-30 DIAGNOSIS — C9000 Multiple myeloma not having achieved remission: Secondary | ICD-10-CM | POA: Diagnosis not present

## 2023-07-30 DIAGNOSIS — Z95828 Presence of other vascular implants and grafts: Secondary | ICD-10-CM

## 2023-07-30 DIAGNOSIS — Z5112 Encounter for antineoplastic immunotherapy: Secondary | ICD-10-CM | POA: Diagnosis not present

## 2023-07-30 DIAGNOSIS — Z7189 Other specified counseling: Secondary | ICD-10-CM | POA: Diagnosis not present

## 2023-07-30 LAB — CBC WITH DIFFERENTIAL (CANCER CENTER ONLY)
Abs Immature Granulocytes: 0.02 10*3/uL (ref 0.00–0.07)
Basophils Absolute: 0.1 10*3/uL (ref 0.0–0.1)
Basophils Relative: 1 %
Eosinophils Absolute: 0.1 10*3/uL (ref 0.0–0.5)
Eosinophils Relative: 2 %
HCT: 35.5 % — ABNORMAL LOW (ref 36.0–46.0)
Hemoglobin: 12.1 g/dL (ref 12.0–15.0)
Immature Granulocytes: 0 %
Lymphocytes Relative: 21 %
Lymphs Abs: 0.9 10*3/uL (ref 0.7–4.0)
MCH: 35.2 pg — ABNORMAL HIGH (ref 26.0–34.0)
MCHC: 34.1 g/dL (ref 30.0–36.0)
MCV: 103.2 fL — ABNORMAL HIGH (ref 80.0–100.0)
Monocytes Absolute: 0.9 10*3/uL (ref 0.1–1.0)
Monocytes Relative: 19 %
Neutro Abs: 2.5 10*3/uL (ref 1.7–7.7)
Neutrophils Relative %: 57 %
Platelet Count: 168 10*3/uL (ref 150–400)
RBC: 3.44 MIL/uL — ABNORMAL LOW (ref 3.87–5.11)
RDW: 13 % (ref 11.5–15.5)
WBC Count: 4.5 10*3/uL (ref 4.0–10.5)
nRBC: 0 % (ref 0.0–0.2)

## 2023-07-30 LAB — CMP (CANCER CENTER ONLY)
ALT: 12 U/L (ref 0–44)
AST: 13 U/L — ABNORMAL LOW (ref 15–41)
Albumin: 3.8 g/dL (ref 3.5–5.0)
Alkaline Phosphatase: 104 U/L (ref 38–126)
Anion gap: 9 (ref 5–15)
BUN: 17 mg/dL (ref 8–23)
CO2: 23 mmol/L (ref 22–32)
Calcium: 9.5 mg/dL (ref 8.9–10.3)
Chloride: 106 mmol/L (ref 98–111)
Creatinine: 1.1 mg/dL — ABNORMAL HIGH (ref 0.44–1.00)
GFR, Estimated: 51 mL/min — ABNORMAL LOW (ref >60)
Glucose, Bld: 230 mg/dL — ABNORMAL HIGH (ref 70–99)
Potassium: 3.9 mmol/L (ref 3.5–5.1)
Sodium: 138 mmol/L (ref 135–145)
Total Bilirubin: 0.5 mg/dL (ref 0.3–1.2)
Total Protein: 6.4 g/dL — ABNORMAL LOW (ref 6.5–8.1)

## 2023-07-30 MED ORDER — DEXAMETHASONE 4 MG PO TABS
8.0000 mg | ORAL_TABLET | Freq: Once | ORAL | Status: AC
  Administered 2023-07-30: 8 mg via ORAL
  Filled 2023-07-30: qty 2

## 2023-07-30 MED ORDER — SODIUM CHLORIDE 0.9% FLUSH
10.0000 mL | Freq: Once | INTRAVENOUS | Status: AC | PRN
  Administered 2023-07-30: 10 mL

## 2023-07-30 MED ORDER — DIPHENHYDRAMINE HCL 25 MG PO CAPS
25.0000 mg | ORAL_CAPSULE | Freq: Once | ORAL | Status: AC
  Administered 2023-07-30: 25 mg via ORAL
  Filled 2023-07-30: qty 1

## 2023-07-30 MED ORDER — DEXTROSE 5 % IV SOLN
36.0000 mg/m2 | Freq: Once | INTRAVENOUS | Status: AC
  Administered 2023-07-30: 60 mg via INTRAVENOUS
  Filled 2023-07-30: qty 30

## 2023-07-30 MED ORDER — PROCHLORPERAZINE MALEATE 10 MG PO TABS
10.0000 mg | ORAL_TABLET | Freq: Once | ORAL | Status: AC
  Administered 2023-07-30: 10 mg via ORAL
  Filled 2023-07-30: qty 1

## 2023-07-30 MED ORDER — FENTANYL 12 MCG/HR TD PT72
1.0000 | MEDICATED_PATCH | TRANSDERMAL | 0 refills | Status: DC
Start: 2023-07-30 — End: 2023-10-04

## 2023-07-30 MED ORDER — SODIUM CHLORIDE 0.9% FLUSH
10.0000 mL | INTRAVENOUS | Status: DC | PRN
  Administered 2023-07-30: 10 mL

## 2023-07-30 MED ORDER — HEPARIN SOD (PORK) LOCK FLUSH 100 UNIT/ML IV SOLN
500.0000 [IU] | Freq: Once | INTRAVENOUS | Status: AC | PRN
  Administered 2023-07-30: 500 [IU]

## 2023-07-30 MED ORDER — SODIUM CHLORIDE 0.9 % IV SOLN: Freq: Once | INTRAVENOUS | Status: AC

## 2023-07-30 MED ORDER — ACETAMINOPHEN 325 MG PO TABS
650.0000 mg | ORAL_TABLET | Freq: Once | ORAL | Status: AC
  Administered 2023-07-30: 650 mg via ORAL
  Filled 2023-07-30: qty 2

## 2023-07-30 MED ORDER — OXYCODONE HCL 10 MG PO TABS: 10.0000 mg | ORAL_TABLET | Freq: Four times a day (QID) | ORAL | 0 refills | Status: DC | PRN

## 2023-07-30 MED ORDER — FAMOTIDINE IN NACL 20-0.9 MG/50ML-% IV SOLN
20.0000 mg | Freq: Once | INTRAVENOUS | Status: AC
  Administered 2023-07-30: 20 mg via INTRAVENOUS
  Filled 2023-07-30: qty 50

## 2023-07-30 NOTE — Progress Notes (Signed)
HEMATOLOGY/ONCOLOGY CLINIC NOTE  Date of Service: 07/30/23   Patient Care Team: Myrlene Broker, MD as PCP - General (Internal Medicine) Hart Carwin, MD (Inactive) as Consulting Physician (Gastroenterology) Jerene Bears, MD as Consulting Physician (Gynecology) Marcene Corning, MD as Consulting Physician (Orthopedic Surgery) Waymon Budge, MD as Consulting Physician (Pulmonary Disease) Mateo Flow, MD (Ophthalmology)  CHIEF COMPLAINTS/PURPOSE OF VISIT:  Follow-up for continued evaluation and management of multiple myeloma  HISTORY OF PRESENTING ILLNESS:  Please see previous notes for details on initial presentation  Current Treatment: Carfilzomib + Revlimid maintenance.  INTERVAL HISTORY:   Faith Orr is a 82 y.o. female who is here for continued evaluation and management of the patient's multiple myeloma. She is ere to start cycle 6 day 15 of her treatment.   Patient was last seen by PA Thayil on 07/02/2023 and she complained of bilateral leg pain, but was doing well overall.   Patient notes she has been doing fairly well since our last visit. She complains of "stabbing" pain on her legs and joints since our last visit. Her last episode of stabbing pain on her legs and joints was this past Wednesday, 07/28/2023. She describes the pain as cramps and reports that it occasionally causes her walking problems. She denies ant recent infection or any new medication.   She also complains of occasional diarrhea and occasional constipation. She denies fever, chills, night sweats, unexpected weight loss, abdominal pain, chest pain, back pain, or leg swelling.   Patient regularly takes her Revlimid as prescribed and has been tolerating it well without any new or severe toxicities.   MEDICAL HISTORY:  Past Medical History:  Diagnosis Date   Allergy    seasonal   Asthma    DEPRESSION    DIABETES MELLITUS, TYPE II    Diverticulosis    HYPERLIPIDEMIA     Macular degeneration of left eye    mild, Dr.Hecker   Obesity, unspecified    Osteoarthritis of both knees    OSTEOPENIA    Osteopenia    URINARY INCONTINENCE     SURGICAL HISTORY: Past Surgical History:  Procedure Laterality Date   CATARACT EXTRACTION Left 05/24/2018   CESAREAN SECTION  01/1973   CYSTOSCOPY/URETEROSCOPY/HOLMIUM LASER/STENT PLACEMENT Right 09/20/2019   Procedure: CYSTOSCOPY/URETEROSCOPY/HOLMIUM LASER/STENT PLACEMENT;  Surgeon: [name], MD;  Location: [facility];  Service: Urology;  Laterality: Right;   FRACTURE SURGERY     IR BONE MARROW BIOPSY & ASPIRATION  02/16/2023   IR IMAGING GUIDED PORT INSERTION  02/20/2019   left wrist surgery  2008   By Dr. Yisroel Ramming   right ankle  1994    SOCIAL HISTORY: Social History   Socioeconomic History   Marital status: Married    Spouse name: Not on file   Number of children: 1   Years of education: Not on file   Highest education level: Not on file  Occupational History    Employer: [employer]  Tobacco Use   Smoking status: Never   Smokeless tobacco: Never   Tobacco comments:    Lives with partner [name]) and son  Vaping Use   Vaping status: Never Used  Substance and Sexual Activity   Alcohol use: No    Alcohol/week: 0.0 standard drinks of alcohol   Drug use: No   Sexual activity: Never    Partners: Female    Birth control/protection: Post-menopausal    Comment: Lives with female partner (annette hicks) and  70 yo son  Other Topics Concern   Not on file  Social History Narrative   Not on file   Social Determinants of Health   Financial Resource Strain: Low Risk  (02/27/2022)   Overall Financial Resource Strain (CARDIA)    Difficulty of Paying Living Expenses: Not hard at all  Food Insecurity: No Food Insecurity (02/27/2022)   Hunger Vital Sign    Worried About Running Out of Food in the Last Year: Never true    Ran Out of Food in the Last Year: Never true   Transportation Needs: No Transportation Needs (02/27/2022)   PRAPARE - Administrator, Civil Service (Medical): No    Lack of Transportation (Non-Medical): No  Physical Activity: Patient Declined (02/27/2022)   Exercise Vital Sign    Days of Exercise per Week: Patient declined    Minutes of Exercise per Session: Patient declined  Stress: No Stress Concern Present (02/27/2022)   [employer] of Occupational Health - Occupational Stress Questionnaire    Feeling of Stress : Not at all  Social Connections: Unknown (02/27/2022)   Social Connection and Isolation Panel [NHANES]    Frequency of Communication with Friends and Family: Patient declined    Frequency of Social Gatherings with Friends and Family: Patient declined    Attends Religious Services: Patient declined    Database administrator or Organizations: Patient declined    Attends Banker Meetings: Patient declined    Marital Status: Living with partner  Intimate Partner Violence: Not At Risk (05/08/2021)   Humiliation, Afraid, Rape, and Kick questionnaire    Fear of Current or Ex-Partner: No    Emotionally Abused: No    Physically Abused: No    Sexually Abused: No    FAMILY HISTORY: Family History  Problem Relation Age of Onset   Diabetes Father    Hyperlipidemia Father    Heart disease Father    Cancer Father    Hypertension Father    Colon cancer Paternal Grandmother 53   Osteoporosis Mother    Protein S deficiency Mother    Hyperlipidemia Mother    Multiple sclerosis Daughter    Cancer Other        bladder   Breast cancer Neg Hx     ALLERGIES:  is allergic to levofloxacin, penicillins, aleve [naproxen sodium], and sulfonamide derivatives.  MEDICATIONS:  Current Outpatient Medications  Medication Sig Dispense Refill   Acetaminophen (TYLENOL PO) Take by mouth as needed.     acyclovir (ZOVIRAX) 400 MG tablet TAKE 1 TABLET(400 MG) BY MOUTH TWICE DAILY 60 tablet 5   Blood Glucose  Monitoring Suppl (FREESTYLE FREEDOM LITE) W/DEVICE KIT Use to check blood sugars twice a day Dx 250.00 1 each 0   Calcium Carbonate-Vitamin D 600-400 MG-UNIT tablet Take 1 tablet by mouth 2 (two) times daily.     dexamethasone (DECADRON) 4 MG tablet Take 5 tablets (20 mg total) by mouth once a week. On D22 of each cycle of treatment 20 tablet 5   ELIQUIS 2.5 MG TABS tablet TAKE 1 TABLET(2.5 MG) BY MOUTH TWICE DAILY 60 tablet 2   fentaNYL (DURAGESIC) 12 MCG/HR Place 1 patch onto the skin every 3 (three) days. 10 patch 0   fluticasone (FLONASE) 50 MCG/ACT nasal spray Place 1 spray into both nostrils daily. 48 g 3   glucose blood (FREESTYLE LITE) test strip CHECK BLOOD SUGAR TWICE DAILY AS DIRECTED Dx 250.00 180 each 3   Lancets (FREESTYLE) lancets Use twice  daily to check sugars. 100 each 11   lenalidomide (REVLIMID) 5 MG capsule TAKE 1 CAPSULE BY MOUTH DAILY  FOR 21 DAYS, THEN 7 DAYS OFF 21 capsule 0   lidocaine-prilocaine (EMLA) cream APPLY 1 APPLICATION TO THE AFFECTED AREA AS NEEDED. USE PRIOR TO PORT ACCESS 30 g 0   meclizine (ANTIVERT) 25 MG tablet Take 1 tablet (25 mg total) by mouth every 8 (eight) hours as needed for dizziness. 20 tablet 0   metFORMIN (GLUCOPHAGE-XR) 500 MG 24 hr tablet Take 1 tablet (500 mg total) by mouth 2 (two) times daily with a meal. 180 tablet 3   Multiple Vitamins-Minerals (ICAPS) CAPS Take 1 capsule by mouth daily after breakfast.     ondansetron (ZOFRAN) 8 MG tablet Take 1 tablet (8 mg total) by mouth 2 (two) times daily as needed (Nausea or vomiting). 30 tablet 1   Oxycodone HCl 10 MG TABS Take 1 tablet (10 mg total) by mouth every 6 (six) hours as needed. 90 tablet 0   pantoprazole (PROTONIX) 20 MG tablet TAKE 1 TABLET(20 MG) BY MOUTH DAILY 30 tablet 2   polyethylene glycol (MIRALAX / GLYCOLAX) packet Take 17 g by mouth daily after breakfast.     potassium chloride SA (KLOR-CON M) 20 MEQ tablet Take 2 tablets (40 mEq total) by mouth 2 (two) times daily. 360  tablet 1   prochlorperazine (COMPAZINE) 10 MG tablet Take 1 tablet (10 mg total) by mouth every 6 (six) hours as needed (Nausea or vomiting). 30 tablet 1   senna-docusate (SENNA S) 8.6-50 MG tablet Take 2 tablets by mouth at bedtime. 60 tablet 2   sertraline (ZOLOFT) 100 MG tablet Take 1 tablet (100 mg total) by mouth daily. 90 tablet 3   simvastatin (ZOCOR) 20 MG tablet Take 1 tablet (20 mg total) by mouth daily at 6 PM. 90 tablet 3   Vitamin D, Ergocalciferol, (DRISDOL) 1.25 MG (50000 UNIT) CAPS capsule TAKE 1 CAPSULE BY MOUTH EVERY 7 DAYS 12 capsule 0   No current facility-administered medications for this visit.   Facility-Administered Medications Ordered in Other Visits  Medication Dose Route Frequency Provider Last Rate Last Admin   heparin lock flush 100 unit/mL  500 Units Intracatheter Once PRN Johney Maine, MD        REVIEW OF SYSTEMS:    10 Point review of Systems was done is negative except as noted above.   PHYSICAL EXAMINATION: ECOG FS:2 - Symptomatic, <50% confined to bed .LMP 10/09/2012    Wt Readings from Last 3 Encounters:  07/16/23 150 lb 8 oz (68.3 kg)  07/02/23 150 lb 8 oz (68.3 kg)  06/18/23 151 lb 9.6 oz (68.8 kg)   There is no height or weight on file to calculate BMI.     GENERAL:alert, in no acute distress and comfortable SKIN: no acute rashes, no significant lesions EYES: conjunctiva are pink and non-injected, sclera anicteric OROPHARYNX: MMM, no exudates, no oropharyngeal erythema or ulceration NECK: supple, no JVD LYMPH:  no palpable lymphadenopathy in the cervical, axillary or inguinal regions LUNGS: clear to auscultation b/l with normal respiratory effort HEART: regular rate & rhythm ABDOMEN:  normoactive bowel sounds , non tender, not distended. Extremity: no pedal edema PSYCH: alert & oriented x 3 with fluent speech NEURO: no focal motor/sensory deficits    LABS     Latest Ref Rng & Units 07/16/2023    7:38 AM 07/02/2023    9:34  AM 06/18/2023    8:39 AM  CBC  WBC 4.0 - 10.5 K/uL 4.4  3.8  3.3   Hemoglobin 12.0 - 15.0 g/dL 03.4  74.2  59.5   Hematocrit 36.0 - 46.0 % 34.3  35.6  33.5   Platelets 150 - 400 K/uL 130  178  100       Latest Ref Rng & Units 07/16/2023    7:38 AM 07/02/2023    9:34 AM 06/18/2023    8:39 AM  CMP  Glucose 70 - 99 mg/dL 638  756  433   BUN 8 - 23 mg/dL 15  12  12    Creatinine 0.44 - 1.00 mg/dL 2.95  1.88  4.16   Sodium 135 - 145 mmol/L 137  140  139   Potassium 3.5 - 5.1 mmol/L 3.7  3.5  3.4   Chloride 98 - 111 mmol/L 107  108  111   CO2 22 - 32 mmol/L 21  21  19    Calcium 8.9 - 10.3 mg/dL 9.0  9.5  9.1   Total Protein 6.5 - 8.1 g/dL 5.9  6.1  5.5   Total Bilirubin 0.3 - 1.2 mg/dL 0.5  0.6  0.6   Alkaline Phos 38 - 126 U/L 112  114  120   AST 15 - 41 U/L 10  12  10    ALT 0 - 44 U/L 12  11  10       09/18/2019 BM Bx Report ([id number])   09/18/2019 FISH Panel    05/30/2019 BM Bx   01/06/2019 BM Bx:     01/06/19 Cytogenetics:      05/30/19 BM Biopsy:   09/18/2019 FISH Panel    09/18/2019 BM Surgical Pathology ([id number])     RADIOGRAPHIC STUDIES: I have personally reviewed the radiological images as listed and agreed with the findings in the report. No results found.   ASSESSMENT & PLAN:   82 y.o. female with  1.  Nonsecretory multiple Myeloma, RISS Stage III  Labs upon initial presentation from 12/08/18, blood counts are normal including WBC at 7.1k, HGB at 13.1, and PLT at 245k. Calcium normal at 10.3. Creatinine normal at 0.63. M spike at 0.5g. 12/13/18 Bone Scan revealed Multifocal uptake throughout the skeleton, consistent with diffuse metastatic disease. Primary tumor is not specified. 2. Uptake in the proximal right femur, consistent with lytic lesions. 3. Uptake in the ribs bilaterally as described. 4. Lesions in the proximal left humerus. 5. Diffuse uptake throughout the skull consistent with metastatic disease. 6. Right paramedian uptake at  the manubrium.  12/13/18 CT Right Femur revealed Numerous lytic lesions involving the right femur and a lytic lesion in the left inferior pubic ramus. Overall appearance is most concerning for multiple myeloma  12/27/18 Pretreatment 24hour UPEP observed an M spike at 18mg , and showed 199mg  total protein/day.  12/27/18 Pretreatment MMP revealed M Protein at 0.5g with IgG Lambda specificity. Kappa:Lambda light chain ratio at 0.13, with Lambda at 40.3. There is less abnormal protein and light chains than I would expect from 30% plasma cells, which suggests hypo-secretory or non-secretory neoplastic plasma cells. Will have an impact in assessing response. 01/05/19 PET/CT revealed Innumerable lytic lesions in the skeleton compatible with myeloma. Most of the larger lesions are hypermetabolic, for example including a left proximal humeral shaft lesion with maximum SUV of 8.1 and a 2.8 cm lesion in the left T9 vertebral body with maximum SUV 5.1. Most of the smaller lytic lesions, and some of the larger lesions, do not demonstrate accentuated metabolic activity. 2. 1.2  cm in short axis lymph node in the left parapharyngeal space is hypermetabolic with maximum SUV 11.8. I do not see a separate mass in the head and neck to give rise to this hypermetabolic lymph node. 3. Mosaic attenuation in the lower lobes, nonspecific possibly from air trapping. 4.  Aortic Atherosclerosis 5. Heterogeneous activity in the liver, making it hard to exclude small liver lesions. Consider hepatic protocol MRI with and without contrast for definitive assessment. Nonobstructive right nephrolithiasis. Old granulomatous disease  01/06/19 Bone Marrow biopsy revealed interstitial increase in plasma cells (28% aspirate, 40% CD138 immunohistochemistry). Plasma cells negative for light chains consistent with a non or weakly secretory myeloma   01/06/19 Cytogenetics revealed 37% of cells with trisomy 11 or 11q deletion, and 40.5% of cells with 17p  mutation  S/p 5 cycles of KRD treatment  05/31/19 BM Biopsy revealed mild atypical plasmacytosis at 5% with polytypic variation.   06/01/19 PET/CT revealed "Dominant lesion in the LEFT humerus is decreased significantly in metabolic activity. Additional hypermetabolic skeletal lytic lesions have decreased in metabolic activity or similar to comparison exam (01/05/2019). No evidence of disease progression. 2. Multiple additional lytic lesions do not have metabolic activity and unchanged. 3. No new skeletal lesions are identified. No soft tissue plasmacytoma identified. 4. Nodule / node in the LEFT parapharyngeal space which is intensely hypermetabolic not changed from prior. 5. New hypermetabolic LEFT lower lobe pulmonary nodule is indeterminate. Recommend close attention on follow-up 6. New obstructive hydronephrosis of the RIGHT kidney related to RIGHT UPJ stone."  09/18/2019 BM Bx Report which revealed "Slightly hypercellular bone marrow for age with trilineage hematopoiesis and 1% plasma cells."  09/14/2019 PET/CT Whole Body Scan ([id number]) which revealed "1. There widespread tiny lytic lesions compatible with multiple myeloma. Index larger lesions are generally similar to the prior exam, with low-grade activity such as the left T9 vertebral body lesion with maximum SUV 4.5. Is mild increase in the activity associated with a mildly sclerotic left proximal humeral lesion, maximum SUV 4.8 (previously 3.5). 2. At the site of the prior left lower lobe nodule is currently more bandlike thickening, with maximum SUV only 1.9, probably benign, continued surveillance of this region suggested. 3. There several small but hypermetabolic lymph nodes. This includes a left parapharyngeal space node measuring 1.0 cm with maximum SUV 12.3 (stable); a left level IB lymph node measuring 0.5 cm with maximum SUV 4.8 (slightly larger than prior); and a left inguinal lymph node measuring 0.7 cm in short axis with maximum SUV  6.4 (previously 0.5 cm with maximum SUV 0.6). Significance of these lymph nodes uncertain, surveillance is recommended. 4. New 5 mm left lower lobe subpleural nodule on image 32/8, not appreciably hypermetabolic, surveillance suggested. 5. Focal subcutaneous stranding along the left perineum measuring about 2.6 by 1.1 cm on image 221/4, maximum SUV 12.5. This was not present previously and is most likely inflammatory, although given the notable SUV, surveillance of this region is suggested. 6. Other imaging findings of potential clinical significance: Aortic Atherosclerosis (ICD10-I70.0). Coronary atherosclerosis. Old granulomatous disease. Mild right hydronephrosis due to a 7 mm right UPJ calculus. 2 mm right kidney upper pole nonobstructive renal calculus. Prominent stool throughout the colon favors constipation."  12/19/2019 Thoracic & Lumbar Spine MRI ([id number]) ([id number]) revealed "Suspected myeloma lesions at T9 and S1. No compression deformity or epidural disease.  01/18/2020 PET/CT ([id number]) which revealed "1. Stable lytic lesions throughout the skeleton. The larger lytic lesions which had mild metabolic activity on comparison exam now have  background metabolic activity. No evidence of active myeloma. No evidence of progression multiple myeloma.  No plasmacytoma 3. Hypermetabolic nodules in the LEFT neck may be associated deep tissues of the LEFT parotid gland. 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  01/17/19 MRI Liver revealed Several appreciable liver lesions all have benign imaging characteristics. No MRI findings of metastatic involvement of the liver. 2. Scattered bony lesions corresponding to the lytic lesions seen at PET-CT, compatible with active  myeloma. 3. Aortic Atherosclerosis.  Mild cardiomegaly. 4. Diffuse hepatic steatosis.   3. Left lower lobe pulmonary nodule First seen on 06/01/19 PET/CT PET/CT 08/27/2020: No hypermetabolic mediastinal or hilar nodes. No suspicious pulmonary nodules on the CT scan.  4. 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 Being managed conservatively as per patient's preference.  No symptoms from this currently.  5. Mild chronic kidney disease CMP done 04/09/2022 showed creatinine of 1.17 and GFR est of 48 consistent with mild chronic kidney disease.  PLAN: -Discussed lab results from today, 07/30/2023, with the patient. CBC is stable. CMP shows elevated glucose level at 230, slightly elevated creatinine at 1.10, and slightly decreased AST at 13.  -Discussed with the patient to stay hydrated and start magnesium supplement to see if it helps with cramps.  -Discussed with the patient to let us know if her symptoms worsen and we will change Revlimid dosage.   -continue maintenance Carfilzomib and Revlimid for high risk myeloma -continue Zometa dose every 12 weeks -answered all of patient's questions in detail   FOLLOW UP: Per integrated scheduling  The total time spent in the appointment was 30 minutes* .  All of the patient's questions were answered with apparent satisfaction. The patient knows to call the clinic with any problems, questions or concerns.   Wyvonnia Lora MD MS AAHIVMS [facility] Hematology/Oncology Physician [facility]  .*Total Encounter Time as defined by the Centers for Medicare and Medicaid Services includes, in addition to the face-to-face time of a patient visit (documented in the note above) non-face-to-face time: obtaining and reviewing outside history, ordering and reviewing medications, tests or procedures, care coordination (communications with other health care professionals  or caregivers) and documentation in the medical record.   I,Param Shah,acting as a Neurosurgeon for Wyvonnia Lora, MD.,have documented all relevant documentation on the behalf of Wyvonnia Lora, MD,as directed by  Wyvonnia Lora, MD while in the presence of Wyvonnia Lora, MD.   .I have reviewed the above documentation for accuracy and completeness, and I agree with the above. Johney Maine MD

## 2023-07-30 NOTE — Progress Notes (Signed)
Patient seen by Dr. Addison Naegeli are within treatment parameters.  Labs reviewed: and are within treatment parameters. Dr Candise Che aware CR: 1.10,   Per physician team, patient is ready for treatment and there are NO modifications to the treatment plan.

## 2023-07-30 NOTE — Patient Instructions (Signed)
[facility]  Discharge Instructions: Thank you for choosing [facility] to provide your oncology and hematology care.   If you have a lab appointment with the Cancer Center, please go directly to the Cancer Center and check in at the registration area.   Wear comfortable clothing and clothing appropriate for easy access to any Portacath or PICC line.   We strive to give you quality time with your provider. You may need to reschedule your appointment if you arrive late (15 or more minutes).  Arriving late affects you and other patients whose appointments are after yours.  Also, if you miss three or more appointments without notifying the office, you may be dismissed from the clinic at the provider's discretion.      For prescription refill requests, have your pharmacy contact our office and allow 72 hours for refills to be completed.    Today you received the following chemotherapy and/or immunotherapy agents :  Kyprolis   To help prevent nausea and vomiting after your treatment, we encourage you to take your nausea medication as directed.  BELOW ARE SYMPTOMS THAT SHOULD BE REPORTED IMMEDIATELY: *FEVER GREATER THAN 100.4 F (38 C) OR HIGHER *CHILLS OR SWEATING *NAUSEA AND VOMITING THAT IS NOT CONTROLLED WITH YOUR NAUSEA MEDICATION *UNUSUAL SHORTNESS OF BREATH *UNUSUAL BRUISING OR BLEEDING *URINARY PROBLEMS (pain or burning when urinating, or frequent urination) *BOWEL PROBLEMS (unusual diarrhea, constipation, pain near the anus) TENDERNESS IN MOUTH AND THROAT WITH OR WITHOUT PRESENCE OF ULCERS (sore throat, sores in mouth, or a toothache) UNUSUAL RASH, SWELLING OR PAIN  UNUSUAL VAGINAL DISCHARGE OR ITCHING   Items with * indicate a potential emergency and should be followed up as soon as possible or go to the Emergency Department if any problems should occur.  Please show the CHEMOTHERAPY ALERT CARD or IMMUNOTHERAPY ALERT CARD at  check-in to the Emergency Department and triage nurse.  Should you have questions after your visit or need to cancel or reschedule your appointment, please contact [facility]  Dept: [phone]  and follow the prompts.  Office hours are 8:00 a.m. to 4:30 p.m. Monday - Friday. Please note that voicemails left after 4:00 p.m. may not be returned until the following business day.  We are closed weekends and major holidays. You have access to a nurse at all times for urgent questions. Please call the main number to the clinic Dept: [phone] and follow the prompts.   For any non-urgent questions, you may also contact your provider using MyChart. We now offer e-Visits for anyone 18 and older to request care online for non-urgent symptoms. For details visit [email or web address].   Also download the MyChart app! Go to the app store, search "MyChart", open the app, select [facility], and log in with your MyChart username and password.   

## 2023-08-06 ENCOUNTER — Encounter: Payer: Self-pay | Admitting: Hematology

## 2023-08-10 ENCOUNTER — Other Ambulatory Visit: Payer: Self-pay

## 2023-08-10 DIAGNOSIS — C9000 Multiple myeloma not having achieved remission: Secondary | ICD-10-CM

## 2023-08-10 MED ORDER — LENALIDOMIDE 5 MG PO CAPS: ORAL_CAPSULE | ORAL | 0 refills | Status: DC

## 2023-08-12 ENCOUNTER — Telehealth: Payer: Self-pay | Admitting: Hematology

## 2023-08-13 ENCOUNTER — Inpatient Hospital Stay: Payer: Medicare PPO

## 2023-08-13 VITALS — BP 107/62 | HR 62 | Temp 98.0°F | Resp 16 | Ht 59.0 in | Wt 150.5 lb

## 2023-08-13 DIAGNOSIS — Z7189 Other specified counseling: Secondary | ICD-10-CM

## 2023-08-13 DIAGNOSIS — C9000 Multiple myeloma not having achieved remission: Secondary | ICD-10-CM

## 2023-08-13 DIAGNOSIS — Z95828 Presence of other vascular implants and grafts: Secondary | ICD-10-CM

## 2023-08-13 DIAGNOSIS — Z79899 Other long term (current) drug therapy: Secondary | ICD-10-CM | POA: Diagnosis not present

## 2023-08-13 DIAGNOSIS — Z5112 Encounter for antineoplastic immunotherapy: Secondary | ICD-10-CM | POA: Diagnosis not present

## 2023-08-13 LAB — CBC WITH DIFFERENTIAL (CANCER CENTER ONLY)
Abs Immature Granulocytes: 0.01 10*3/uL (ref 0.00–0.07)
Basophils Absolute: 0 10*3/uL (ref 0.0–0.1)
Basophils Relative: 1 %
Eosinophils Absolute: 0.1 10*3/uL (ref 0.0–0.5)
Eosinophils Relative: 3 %
HCT: 34.8 % — ABNORMAL LOW (ref 36.0–46.0)
Hemoglobin: 11.7 g/dL — ABNORMAL LOW (ref 12.0–15.0)
Immature Granulocytes: 0 %
Lymphocytes Relative: 26 %
Lymphs Abs: 0.9 10*3/uL (ref 0.7–4.0)
MCH: 34.4 pg — ABNORMAL HIGH (ref 26.0–34.0)
MCHC: 33.6 g/dL (ref 30.0–36.0)
MCV: 102.4 fL — ABNORMAL HIGH (ref 80.0–100.0)
Monocytes Absolute: 0.8 10*3/uL (ref 0.1–1.0)
Monocytes Relative: 22 %
Neutro Abs: 1.7 10*3/uL (ref 1.7–7.7)
Neutrophils Relative %: 48 %
Platelet Count: 123 10*3/uL — ABNORMAL LOW (ref 150–400)
RBC: 3.4 MIL/uL — ABNORMAL LOW (ref 3.87–5.11)
RDW: 13.7 % (ref 11.5–15.5)
WBC Count: 3.5 10*3/uL — ABNORMAL LOW (ref 4.0–10.5)
nRBC: 0 % (ref 0.0–0.2)

## 2023-08-13 LAB — CMP (CANCER CENTER ONLY)
ALT: 14 U/L (ref 0–44)
AST: 17 U/L (ref 15–41)
Albumin: 3.3 g/dL — ABNORMAL LOW (ref 3.5–5.0)
Alkaline Phosphatase: 82 U/L (ref 38–126)
Anion gap: 8 (ref 5–15)
BUN: 15 mg/dL (ref 8–23)
CO2: 22 mmol/L (ref 22–32)
Calcium: 9.1 mg/dL (ref 8.9–10.3)
Chloride: 109 mmol/L (ref 98–111)
Creatinine: 0.95 mg/dL (ref 0.44–1.00)
GFR, Estimated: >60 mL/min (ref >60)
Glucose, Bld: 241 mg/dL — ABNORMAL HIGH (ref 70–99)
Potassium: 3.5 mmol/L (ref 3.5–5.1)
Sodium: 139 mmol/L (ref 135–145)
Total Bilirubin: 0.6 mg/dL (ref 0.3–1.2)
Total Protein: 6.1 g/dL — ABNORMAL LOW (ref 6.5–8.1)

## 2023-08-13 MED ORDER — SODIUM CHLORIDE 0.9 % IV SOLN: Freq: Once | INTRAVENOUS | Status: AC

## 2023-08-13 MED ORDER — DEXTROSE 5 % IV SOLN
36.0000 mg/m2 | Freq: Once | INTRAVENOUS | Status: AC
  Administered 2023-08-13: 60 mg via INTRAVENOUS
  Filled 2023-08-13: qty 30

## 2023-08-13 MED ORDER — DIPHENHYDRAMINE HCL 25 MG PO CAPS
25.0000 mg | ORAL_CAPSULE | Freq: Once | ORAL | Status: AC
  Administered 2023-08-13: 25 mg via ORAL
  Filled 2023-08-13: qty 1

## 2023-08-13 MED ORDER — HEPARIN SOD (PORK) LOCK FLUSH 100 UNIT/ML IV SOLN
500.0000 [IU] | Freq: Once | INTRAVENOUS | Status: AC | PRN
  Administered 2023-08-13: 500 [IU]

## 2023-08-13 MED ORDER — DEXAMETHASONE 4 MG PO TABS
8.0000 mg | ORAL_TABLET | Freq: Once | ORAL | Status: AC
  Administered 2023-08-13: 8 mg via ORAL
  Filled 2023-08-13: qty 2

## 2023-08-13 MED ORDER — ACETAMINOPHEN 325 MG PO TABS
650.0000 mg | ORAL_TABLET | Freq: Once | ORAL | Status: AC
  Administered 2023-08-13: 650 mg via ORAL
  Filled 2023-08-13: qty 2

## 2023-08-13 MED ORDER — PROCHLORPERAZINE MALEATE 10 MG PO TABS
10.0000 mg | ORAL_TABLET | Freq: Once | ORAL | Status: AC
  Administered 2023-08-13: 10 mg via ORAL
  Filled 2023-08-13: qty 1

## 2023-08-13 MED ORDER — FAMOTIDINE IN NACL 20-0.9 MG/50ML-% IV SOLN
20.0000 mg | Freq: Once | INTRAVENOUS | Status: AC
  Administered 2023-08-13: 20 mg via INTRAVENOUS
  Filled 2023-08-13: qty 50

## 2023-08-13 MED ORDER — SODIUM CHLORIDE 0.9% FLUSH
10.0000 mL | INTRAVENOUS | Status: DC | PRN
  Administered 2023-08-13: 10 mL

## 2023-08-13 MED ORDER — SODIUM CHLORIDE 0.9% FLUSH
10.0000 mL | Freq: Once | INTRAVENOUS | Status: AC
  Administered 2023-08-13: 10 mL

## 2023-08-13 NOTE — Patient Instructions (Signed)
[facility]   Discharge Instructions: Thank you for choosing [facility] to provide your oncology and hematology care.   If you have a lab appointment with the Cancer Center, please go directly to the Cancer Center and check in at the registration area.   Wear comfortable clothing and clothing appropriate for easy access to any Portacath or PICC line.   We strive to give you quality time with your provider. You may need to reschedule your appointment if you arrive late (15 or more minutes).  Arriving late affects you and other patients whose appointments are after yours.  Also, if you miss three or more appointments without notifying the office, you may be dismissed from the clinic at the provider's discretion.      For prescription refill requests, have your pharmacy contact our office and allow 72 hours for refills to be completed.    Today you received the following chemotherapy and/or immunotherapy agents: Carfilzomib (Kyprolis)       To help prevent nausea and vomiting after your treatment, we encourage you to take your nausea medication as directed.  BELOW ARE SYMPTOMS THAT SHOULD BE REPORTED IMMEDIATELY: *FEVER GREATER THAN 100.4 F (38 C) OR HIGHER *CHILLS OR SWEATING *NAUSEA AND VOMITING THAT IS NOT CONTROLLED WITH YOUR NAUSEA MEDICATION *UNUSUAL SHORTNESS OF BREATH *UNUSUAL BRUISING OR BLEEDING *URINARY PROBLEMS (pain or burning when urinating, or frequent urination) *BOWEL PROBLEMS (unusual diarrhea, constipation, pain near the anus) TENDERNESS IN MOUTH AND THROAT WITH OR WITHOUT PRESENCE OF ULCERS (sore throat, sores in mouth, or a toothache) UNUSUAL RASH, SWELLING OR PAIN  UNUSUAL VAGINAL DISCHARGE OR ITCHING   Items with * indicate a potential emergency and should be followed up as soon as possible or go to the Emergency Department if any problems should occur.  Please show the CHEMOTHERAPY ALERT CARD or IMMUNOTHERAPY  ALERT CARD at check-in to the Emergency Department and triage nurse.  Should you have questions after your visit or need to cancel or reschedule your appointment, please contact [facility]  Dept: [phone]  and follow the prompts.  Office hours are 8:00 a.m. to 4:30 p.m. Monday - Friday. Please note that voicemails left after 4:00 p.m. may not be returned until the following business day.  We are closed weekends and major holidays. You have access to a nurse at all times for urgent questions. Please call the main number to the clinic Dept: [phone] and follow the prompts.   For any non-urgent questions, you may also contact your provider using MyChart. We now offer e-Visits for anyone 18 and older to request care online for non-urgent symptoms. For details visit [email or web address].   Also download the MyChart app! Go to the app store, search "MyChart", open the app, select [facility], and log in with your MyChart username and password. 

## 2023-08-13 NOTE — Progress Notes (Signed)
Per Dr. Candise Che - okay to proceed with CMP results from 8/16.

## 2023-08-15 ENCOUNTER — Other Ambulatory Visit: Payer: Self-pay

## 2023-08-16 ENCOUNTER — Other Ambulatory Visit: Payer: Self-pay

## 2023-08-27 ENCOUNTER — Inpatient Hospital Stay: Payer: Medicare PPO

## 2023-08-27 ENCOUNTER — Other Ambulatory Visit: Payer: Self-pay | Admitting: Hematology

## 2023-08-27 ENCOUNTER — Inpatient Hospital Stay: Payer: Medicare PPO | Attending: Hematology | Admitting: Hematology

## 2023-08-27 DIAGNOSIS — Z5112 Encounter for antineoplastic immunotherapy: Secondary | ICD-10-CM | POA: Insufficient documentation

## 2023-08-27 DIAGNOSIS — C9000 Multiple myeloma not having achieved remission: Secondary | ICD-10-CM | POA: Insufficient documentation

## 2023-08-27 DIAGNOSIS — Z7189 Other specified counseling: Secondary | ICD-10-CM

## 2023-08-27 DIAGNOSIS — Z79899 Other long term (current) drug therapy: Secondary | ICD-10-CM | POA: Insufficient documentation

## 2023-08-27 LAB — CMP (CANCER CENTER ONLY)
ALT: 10 U/L (ref 0–44)
AST: 11 U/L — ABNORMAL LOW (ref 15–41)
Albumin: 3.7 g/dL (ref 3.5–5.0)
Alkaline Phosphatase: 92 U/L (ref 38–126)
Anion gap: 8 (ref 5–15)
BUN: 18 mg/dL (ref 8–23)
CO2: 24 mmol/L (ref 22–32)
Calcium: 9.8 mg/dL (ref 8.9–10.3)
Chloride: 109 mmol/L (ref 98–111)
Creatinine: 1.38 mg/dL — ABNORMAL HIGH (ref 0.44–1.00)
GFR, Estimated: 39 mL/min — ABNORMAL LOW (ref >60)
Glucose, Bld: 173 mg/dL — ABNORMAL HIGH (ref 70–99)
Potassium: 4.1 mmol/L (ref 3.5–5.1)
Sodium: 141 mmol/L (ref 135–145)
Total Bilirubin: 0.5 mg/dL (ref 0.3–1.2)
Total Protein: 6.2 g/dL — ABNORMAL LOW (ref 6.5–8.1)

## 2023-08-27 LAB — CBC WITH DIFFERENTIAL (CANCER CENTER ONLY)
Abs Immature Granulocytes: 0.01 10*3/uL (ref 0.00–0.07)
Basophils Absolute: 0.1 10*3/uL (ref 0.0–0.1)
Basophils Relative: 1 %
Eosinophils Absolute: 0.1 10*3/uL (ref 0.0–0.5)
Eosinophils Relative: 2 %
HCT: 34.6 % — ABNORMAL LOW (ref 36.0–46.0)
Hemoglobin: 11.5 g/dL — ABNORMAL LOW (ref 12.0–15.0)
Immature Granulocytes: 0 %
Lymphocytes Relative: 32 %
Lymphs Abs: 1.1 10*3/uL (ref 0.7–4.0)
MCH: 34.3 pg — ABNORMAL HIGH (ref 26.0–34.0)
MCHC: 33.2 g/dL (ref 30.0–36.0)
MCV: 103.3 fL — ABNORMAL HIGH (ref 80.0–100.0)
Monocytes Absolute: 0.9 10*3/uL (ref 0.1–1.0)
Monocytes Relative: 24 %
Neutro Abs: 1.4 10*3/uL — ABNORMAL LOW (ref 1.7–7.7)
Neutrophils Relative %: 41 %
Platelet Count: 194 10*3/uL (ref 150–400)
RBC: 3.35 MIL/uL — ABNORMAL LOW (ref 3.87–5.11)
RDW: 13.2 % (ref 11.5–15.5)
WBC Count: 3.5 10*3/uL — ABNORMAL LOW (ref 4.0–10.5)
nRBC: 0 % (ref 0.0–0.2)

## 2023-08-27 MED ORDER — SODIUM CHLORIDE 0.9 % IV SOLN: Freq: Once | INTRAVENOUS | Status: AC

## 2023-08-27 MED ORDER — ACETAMINOPHEN 325 MG PO TABS
650.0000 mg | ORAL_TABLET | Freq: Once | ORAL | Status: AC
  Administered 2023-08-27: 650 mg via ORAL
  Filled 2023-08-27: qty 2

## 2023-08-27 MED ORDER — FAMOTIDINE IN NACL 20-0.9 MG/50ML-% IV SOLN
20.0000 mg | Freq: Once | INTRAVENOUS | Status: AC
  Administered 2023-08-27: 20 mg via INTRAVENOUS
  Filled 2023-08-27: qty 50

## 2023-08-27 MED ORDER — SODIUM CHLORIDE 0.9% FLUSH
10.0000 mL | INTRAVENOUS | Status: DC | PRN
  Administered 2023-08-27: 10 mL

## 2023-08-27 MED ORDER — DEXTROSE 5 % IV SOLN
36.0000 mg/m2 | Freq: Once | INTRAVENOUS | Status: AC
  Administered 2023-08-27: 60 mg via INTRAVENOUS
  Filled 2023-08-27: qty 30

## 2023-08-27 MED ORDER — PROCHLORPERAZINE MALEATE 10 MG PO TABS
10.0000 mg | ORAL_TABLET | Freq: Once | ORAL | Status: AC
  Administered 2023-08-27: 10 mg via ORAL
  Filled 2023-08-27: qty 1

## 2023-08-27 MED ORDER — DEXAMETHASONE 4 MG PO TABS
8.0000 mg | ORAL_TABLET | Freq: Once | ORAL | Status: AC
  Administered 2023-08-27: 8 mg via ORAL
  Filled 2023-08-27: qty 2

## 2023-08-27 MED ORDER — METRONIDAZOLE 1 % EX GEL: Freq: Two times a day (BID) | CUTANEOUS | 1 refills | Status: AC

## 2023-08-27 MED ORDER — DIPHENHYDRAMINE HCL 25 MG PO CAPS
25.0000 mg | ORAL_CAPSULE | Freq: Once | ORAL | Status: AC
  Administered 2023-08-27: 25 mg via ORAL
  Filled 2023-08-27: qty 1

## 2023-08-27 MED ORDER — HEPARIN SOD (PORK) LOCK FLUSH 100 UNIT/ML IV SOLN
500.0000 [IU] | Freq: Once | INTRAVENOUS | Status: AC | PRN
  Administered 2023-08-27: 500 [IU]

## 2023-08-27 NOTE — Progress Notes (Signed)
HEMATOLOGY/ONCOLOGY CLINIC NOTE  Date of Service: 08/27/23   Patient Care Team: Myrlene Broker, MD as PCP - General (Internal Medicine) Hart Carwin, MD (Inactive) as Consulting Physician (Gastroenterology) Jerene Bears, MD as Consulting Physician (Gynecology) Marcene Corning, MD as Consulting Physician (Orthopedic Surgery) Waymon Budge, MD as Consulting Physician (Pulmonary Disease) Mateo Flow, MD (Ophthalmology)  CHIEF COMPLAINTS/PURPOSE OF VISIT:  Follow-up for continued evaluation and management of multiple myeloma  HISTORY OF PRESENTING ILLNESS:  Please see previous notes for details on initial presentation  Current Treatment: Carfilzomib + Revlimid maintenance.  INTERVAL HISTORY:   Faith Orr is a 82 y.o. female who is here for continued evaluation and management of the patient's multiple myeloma. She is here to start cycle 7 day 15 of her treatment.   Patient was last seen by me on 07/30/2023 and complained of a "stabbing" pain/cramps in her legs and joints, occasional diarrhea, and occasional constipation.  Today, she complains of increased stable intermittent back pain. Patient notes that her pain is worsened with extended periods of standing. She believes that the pain may be muscular and fluctuates between the left and right side. Patient complains of right-sided back pain at this time. Tylenol does improve symptoms. She also uses an SI brace to manage symptoms.    Patient reports a rosacea flare on her forehead with itchiness, dryness, and flaking. She has been on a general antibiotic to manage it. She denies any concern for eczema. She does not use any products with Retinol.   Patient denies any other treatment toxicities. She notes that her rosacea symptoms previously cleared after receiving chemotherapy, but has since restarted in forehead.   Patient reports that she will have dental work soon involving fillings. She notes some decay  under her crowns and there may potentially be a role for removal of two teeth.   She denies any leg swelling and has no concern with urinary retention.   She generally drinks two of 32 ounces bottles of water daily and takes B complex regularly. She reports that Her blood sugars have been generally stable.   MEDICAL HISTORY:  Past Medical History:  Diagnosis Date   Allergy    seasonal   Asthma    DEPRESSION    DIABETES MELLITUS, TYPE II    Diverticulosis    HYPERLIPIDEMIA    Macular degeneration of left eye    mild, Dr.Hecker   Obesity, unspecified    Osteoarthritis of both knees    OSTEOPENIA    Osteopenia    URINARY INCONTINENCE     SURGICAL HISTORY: Past Surgical History:  Procedure Laterality Date   CATARACT EXTRACTION Left 05/24/2018   CESAREAN SECTION  01/1973   CYSTOSCOPY/URETEROSCOPY/HOLMIUM LASER/STENT PLACEMENT Right 09/20/2019   Procedure: CYSTOSCOPY/URETEROSCOPY/HOLMIUM LASER/STENT PLACEMENT;  Surgeon: [name], MD;  Location: [facility];  Service: Urology;  Laterality: Right;   FRACTURE SURGERY     IR BONE MARROW BIOPSY & ASPIRATION  02/16/2023   IR IMAGING GUIDED PORT INSERTION  02/20/2019   left wrist surgery  2008   By Dr. Yisroel Ramming   right ankle  1994    SOCIAL HISTORY: Social History   Socioeconomic History   Marital status: Married    Spouse name: Not on file   Number of children: 1   Years of education: Not on file   Highest education level: Not on file  Occupational History    Employer: [employer]  Tobacco Use  Smoking status: Never   Smokeless tobacco: Never   Tobacco comments:    Lives with partner [name]) and son  Vaping Use   Vaping status: Never Used  Substance and Sexual Activity   Alcohol use: No    Alcohol/week: 0.0 standard drinks of alcohol   Drug use: No   Sexual activity: Never    Partners: Female    Birth control/protection: Post-menopausal    Comment: Lives with female partner  (annette hicks) and 23 yo son  Other Topics Concern   Not on file  Social History Narrative   Not on file   Social Determinants of Health   Financial Resource Strain: Low Risk  (02/27/2022)   Overall Financial Resource Strain (CARDIA)    Difficulty of Paying Living Expenses: Not hard at all  Food Insecurity: No Food Insecurity (02/27/2022)   Hunger Vital Sign    Worried About Running Out of Food in the Last Year: Never true    Ran Out of Food in the Last Year: Never true  Transportation Needs: No Transportation Needs (02/27/2022)   PRAPARE - Administrator, Civil Service (Medical): No    Lack of Transportation (Non-Medical): No  Physical Activity: Patient Declined (02/27/2022)   Exercise Vital Sign    Days of Exercise per Week: Patient declined    Minutes of Exercise per Session: Patient declined  Stress: No Stress Concern Present (02/27/2022)   [employer] of Occupational Health - Occupational Stress Questionnaire    Feeling of Stress : Not at all  Social Connections: Unknown (02/27/2022)   Social Connection and Isolation Panel [NHANES]    Frequency of Communication with Friends and Family: Patient declined    Frequency of Social Gatherings with Friends and Family: Patient declined    Attends Religious Services: Patient declined    Database administrator or Organizations: Patient declined    Attends Banker Meetings: Patient declined    Marital Status: Living with partner  Intimate Partner Violence: Not At Risk (05/08/2021)   Humiliation, Afraid, Rape, and Kick questionnaire    Fear of Current or Ex-Partner: No    Emotionally Abused: No    Physically Abused: No    Sexually Abused: No    FAMILY HISTORY: Family History  Problem Relation Age of Onset   Diabetes Father    Hyperlipidemia Father    Heart disease Father    Cancer Father    Hypertension Father    Colon cancer Paternal Grandmother 61   Osteoporosis Mother    Protein S deficiency  Mother    Hyperlipidemia Mother    Multiple sclerosis Daughter    Cancer Other        bladder   Breast cancer Neg Hx     ALLERGIES:  is allergic to levofloxacin, penicillins, aleve [naproxen sodium], and sulfonamide derivatives.  MEDICATIONS:  Current Outpatient Medications  Medication Sig Dispense Refill   Acetaminophen (TYLENOL PO) Take by mouth as needed.     acyclovir (ZOVIRAX) 400 MG tablet TAKE 1 TABLET(400 MG) BY MOUTH TWICE DAILY 60 tablet 5   Blood Glucose Monitoring Suppl (FREESTYLE FREEDOM LITE) W/DEVICE KIT Use to check blood sugars twice a day Dx 250.00 1 each 0   Calcium Carbonate-Vitamin D 600-400 MG-UNIT tablet Take 1 tablet by mouth 2 (two) times daily.     dexamethasone (DECADRON) 4 MG tablet Take 5 tablets (20 mg total) by mouth once a week. On D22 of each cycle of treatment 20 tablet 5  ELIQUIS 2.5 MG TABS tablet TAKE 1 TABLET(2.5 MG) BY MOUTH TWICE DAILY 60 tablet 2   fentaNYL (DURAGESIC) 12 MCG/HR Place 1 patch onto the skin every 3 (three) days. 10 patch 0   fluticasone (FLONASE) 50 MCG/ACT nasal spray Place 1 spray into both nostrils daily. 48 g 3   glucose blood (FREESTYLE LITE) test strip CHECK BLOOD SUGAR TWICE DAILY AS DIRECTED Dx 250.00 180 each 3   Lancets (FREESTYLE) lancets Use twice daily to check sugars. 100 each 11   lenalidomide (REVLIMID) 5 MG capsule TAKE 1 CAPSULE BY MOUTH DAILY  FOR 21 DAYS, THEN 7 DAYS OFF 21 capsule 0   lidocaine-prilocaine (EMLA) cream APPLY 1 APPLICATION TO THE AFFECTED AREA AS NEEDED. USE PRIOR TO PORT ACCESS 30 g 0   meclizine (ANTIVERT) 25 MG tablet Take 1 tablet (25 mg total) by mouth every 8 (eight) hours as needed for dizziness. 20 tablet 0   metFORMIN (GLUCOPHAGE-XR) 500 MG 24 hr tablet Take 1 tablet (500 mg total) by mouth 2 (two) times daily with a meal. 180 tablet 3   Multiple Vitamins-Minerals (ICAPS) CAPS Take 1 capsule by mouth daily after breakfast.     ondansetron (ZOFRAN) 8 MG tablet Take 1 tablet (8 mg total)  by mouth 2 (two) times daily as needed (Nausea or vomiting). 30 tablet 1   Oxycodone HCl 10 MG TABS Take 1 tablet (10 mg total) by mouth every 6 (six) hours as needed. 90 tablet 0   pantoprazole (PROTONIX) 20 MG tablet TAKE 1 TABLET(20 MG) BY MOUTH DAILY 30 tablet 2   polyethylene glycol (MIRALAX / GLYCOLAX) packet Take 17 g by mouth daily after breakfast.     potassium chloride SA (KLOR-CON M) 20 MEQ tablet Take 2 tablets (40 mEq total) by mouth 2 (two) times daily. 360 tablet 1   prochlorperazine (COMPAZINE) 10 MG tablet Take 1 tablet (10 mg total) by mouth every 6 (six) hours as needed (Nausea or vomiting). 30 tablet 1   senna-docusate (SENNA S) 8.6-50 MG tablet Take 2 tablets by mouth at bedtime. 60 tablet 2   sertraline (ZOLOFT) 100 MG tablet Take 1 tablet (100 mg total) by mouth daily. 90 tablet 3   simvastatin (ZOCOR) 20 MG tablet Take 1 tablet (20 mg total) by mouth daily at 6 PM. 90 tablet 3   Vitamin D, Ergocalciferol, (DRISDOL) 1.25 MG (50000 UNIT) CAPS capsule TAKE 1 CAPSULE BY MOUTH EVERY 7 DAYS 12 capsule 0   No current facility-administered medications for this visit.   Facility-Administered Medications Ordered in Other Visits  Medication Dose Route Frequency Provider Last Rate Last Admin   heparin lock flush 100 unit/mL  500 Units Intracatheter Once PRN Johney Maine, MD        REVIEW OF SYSTEMS:    10 Point review of Systems was done is negative except as noted above.   PHYSICAL EXAMINATION: ECOG FS:2 - Symptomatic, <50% confined to bed .LMP 10/09/2012    Wt Readings from Last 3 Encounters:  08/13/23 150 lb 8 oz (68.3 kg)  07/30/23 150 lb 12.8 oz (68.4 kg)  07/16/23 150 lb 8 oz (68.3 kg)   There is no height or weight on file to calculate BMI.     GENERAL:alert, in no acute distress and comfortable SKIN: no acute rashes, no significant lesions EYES: conjunctiva are pink and non-injected, sclera anicteric OROPHARYNX: MMM, no exudates, no oropharyngeal  erythema or ulceration NECK: supple, no JVD LYMPH:  no palpable lymphadenopathy in the  cervical, axillary or inguinal regions LUNGS: clear to auscultation b/l with normal respiratory effort HEART: regular rate & rhythm ABDOMEN:  normoactive bowel sounds , non tender, not distended. Extremity: no pedal edema PSYCH: alert & oriented x 3 with fluent speech NEURO: no focal motor/sensory deficits   LABS     Latest Ref Rng & Units 08/13/2023    1:53 PM 07/30/2023   10:57 AM 07/16/2023    7:38 AM  CBC  WBC 4.0 - 10.5 K/uL 3.5  4.5  4.4   Hemoglobin 12.0 - 15.0 g/dL 60.4  54.0  98.1   Hematocrit 36.0 - 46.0 % 34.8  35.5  34.3   Platelets 150 - 400 K/uL 123  168  130       Latest Ref Rng & Units 08/13/2023    1:53 PM 07/30/2023   10:57 AM 07/16/2023    7:38 AM  CMP  Glucose 70 - 99 mg/dL 191  478  295   BUN 8 - 23 mg/dL 15  17  15    Creatinine 0.44 - 1.00 mg/dL 6.21  3.08  6.57   Sodium 135 - 145 mmol/L 139  138  137   Potassium 3.5 - 5.1 mmol/L 3.5  3.9  3.7   Chloride 98 - 111 mmol/L 109  106  107   CO2 22 - 32 mmol/L 22  23  21    Calcium 8.9 - 10.3 mg/dL 9.1  9.5  9.0   Total Protein 6.5 - 8.1 g/dL 6.1  6.4  5.9   Total Bilirubin 0.3 - 1.2 mg/dL 0.6  0.5  0.5   Alkaline Phos 38 - 126 U/L 82  104  112   AST 15 - 41 U/L 17  13  10    ALT 0 - 44 U/L 14  12  12       09/18/2019 BM Bx Report ([id number])   09/18/2019 FISH Panel    05/30/2019 BM Bx   01/06/2019 BM Bx:     01/06/19 Cytogenetics:      05/30/19 BM Biopsy:   09/18/2019 FISH Panel    09/18/2019 BM Surgical Pathology ([id number])     RADIOGRAPHIC STUDIES: I have personally reviewed the radiological images as listed and agreed with the findings in the report. No results found.   ASSESSMENT & PLAN:   82 y.o. female with  1.  Nonsecretory multiple Myeloma, RISS Stage III  Labs upon initial presentation from 12/08/18, blood counts are normal including WBC at 7.1k, HGB at 13.1, and PLT at  245k. Calcium normal at 10.3. Creatinine normal at 0.63. M spike at 0.5g. 12/13/18 Bone Scan revealed Multifocal uptake throughout the skeleton, consistent with diffuse metastatic disease. Primary tumor is not specified. 2. Uptake in the proximal right femur, consistent with lytic lesions. 3. Uptake in the ribs bilaterally as described. 4. Lesions in the proximal left humerus. 5. Diffuse uptake throughout the skull consistent with metastatic disease. 6. Right paramedian uptake at the manubrium.  12/13/18 CT Right Femur revealed Numerous lytic lesions involving the right femur and a lytic lesion in the left inferior pubic ramus. Overall appearance is most concerning for multiple myeloma  12/27/18 Pretreatment 24hour UPEP observed an M spike at 18mg , and showed 199mg  total protein/day.  12/27/18 Pretreatment MMP revealed M Protein at 0.5g with IgG Lambda specificity. Kappa:Lambda light chain ratio at 0.13, with Lambda at 40.3. There is less abnormal protein and light chains than I would expect from 30% plasma cells, which suggests hypo-secretory or non-secretory  neoplastic plasma cells. Will have an impact in assessing response. 01/05/19 PET/CT revealed Innumerable lytic lesions in the skeleton compatible with myeloma. Most of the larger lesions are hypermetabolic, for example including a left proximal humeral shaft lesion with maximum SUV of 8.1 and a 2.8 cm lesion in the left T9 vertebral body with maximum SUV 5.1. Most of the smaller lytic lesions, and some of the larger lesions, do not demonstrate accentuated metabolic activity. 2. 1.2 cm in short axis lymph node in the left parapharyngeal space is hypermetabolic with maximum SUV 11.8. I do not see a separate mass in the head and neck to give rise to this hypermetabolic lymph node. 3. Mosaic attenuation in the lower lobes, nonspecific possibly from air trapping. 4.  Aortic Atherosclerosis 5. Heterogeneous activity in the liver, making it hard to exclude small  liver lesions. Consider hepatic protocol MRI with and without contrast for definitive assessment. Nonobstructive right nephrolithiasis. Old granulomatous disease  01/06/19 Bone Marrow biopsy revealed interstitial increase in plasma cells (28% aspirate, 40% CD138 immunohistochemistry). Plasma cells negative for light chains consistent with a non or weakly secretory myeloma   01/06/19 Cytogenetics revealed 37% of cells with trisomy 11 or 11q deletion, and 40.5% of cells with 17p mutation  S/p 5 cycles of KRD treatment  05/31/19 BM Biopsy revealed mild atypical plasmacytosis at 5% with polytypic variation.   06/01/19 PET/CT revealed "Dominant lesion in the LEFT humerus is decreased significantly in metabolic activity. Additional hypermetabolic skeletal lytic lesions have decreased in metabolic activity or similar to comparison exam (01/05/2019). No evidence of disease progression. 2. Multiple additional lytic lesions do not have metabolic activity and unchanged. 3. No new skeletal lesions are identified. No soft tissue plasmacytoma identified. 4. Nodule / node in the LEFT parapharyngeal space which is intensely hypermetabolic not changed from prior. 5. New hypermetabolic LEFT lower lobe pulmonary nodule is indeterminate. Recommend close attention on follow-up 6. New obstructive hydronephrosis of the RIGHT kidney related to RIGHT UPJ stone."  09/18/2019 BM Bx Report which revealed "Slightly hypercellular bone marrow for age with trilineage hematopoiesis and 1% plasma cells."  09/14/2019 PET/CT Whole Body Scan ([id number]) which revealed "1. There widespread tiny lytic lesions compatible with multiple myeloma. Index larger lesions are generally similar to the prior exam, with low-grade activity such as the left T9 vertebral body lesion with maximum SUV 4.5. Is mild increase in the activity associated with a mildly sclerotic left proximal humeral lesion, maximum SUV 4.8 (previously 3.5). 2. At the site of the  prior left lower lobe nodule is currently more bandlike thickening, with maximum SUV only 1.9, probably benign, continued surveillance of this region suggested. 3. There several small but hypermetabolic lymph nodes. This includes a left parapharyngeal space node measuring 1.0 cm with maximum SUV 12.3 (stable); a left level IB lymph node measuring 0.5 cm with maximum SUV 4.8 (slightly larger than prior); and a left inguinal lymph node measuring 0.7 cm in short axis with maximum SUV 6.4 (previously 0.5 cm with maximum SUV 0.6). Significance of these lymph nodes uncertain, surveillance is recommended. 4. New 5 mm left lower lobe subpleural nodule on image 32/8, not appreciably hypermetabolic, surveillance suggested. 5. Focal subcutaneous stranding along the left perineum measuring about 2.6 by 1.1 cm on image 221/4, maximum SUV 12.5. This was not present previously and is most likely inflammatory, although given the notable SUV, surveillance of this region is suggested. 6. Other imaging findings of potential clinical significance: Aortic Atherosclerosis (ICD10-I70.0). Coronary atherosclerosis. Old granulomatous  disease. Mild right hydronephrosis due to a 7 mm right UPJ calculus. 2 mm right kidney upper pole nonobstructive renal calculus. Prominent stool throughout the colon favors constipation."  12/19/2019 Thoracic & Lumbar Spine MRI ([id number]) ([id number]) revealed "Suspected myeloma lesions at T9 and S1. No compression deformity or epidural disease.  01/18/2020 PET/CT ([id number]) which revealed "1. Stable lytic lesions throughout the skeleton. The larger lytic lesions which had mild metabolic activity on comparison exam now have background metabolic activity. No evidence of active myeloma. No evidence of progression multiple myeloma.  No plasmacytoma 3. Hypermetabolic nodules in the LEFT neck may be associated deep tissues of the LEFT parotid gland. 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  01/17/19 MRI Liver revealed Several appreciable liver lesions all have benign imaging characteristics. No MRI findings of metastatic involvement of the liver. 2. Scattered bony lesions corresponding to the lytic lesions seen at PET-CT, compatible with active myeloma. 3. Aortic Atherosclerosis.  Mild cardiomegaly. 4. Diffuse hepatic steatosis.   3. Left lower lobe pulmonary nodule First seen on 06/01/19 PET/CT PET/CT 08/27/2020: No hypermetabolic mediastinal or hilar nodes. No suspicious pulmonary nodules on the CT scan.  4. 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 Being managed conservatively as per patient's preference.  No symptoms from this currently.  5. Mild chronic kidney disease CMP done 04/09/2022 showed creatinine of 1.17 and GFR est of 48 consistent with mild chronic kidney disease.  PLAN:  -Discussed lab results on 08/27/23 in detail with patient. CBC stable, showed WBC of 3.5K, hemoglobin of 11.5, and platelets of 194K. -CMP shows mild elevation of kidney numbers with creatinine level of 1.38 -if back pain is bothersome, discussed options of SI brace or Voltaren gel -will order Metronidazole gel for rosacea flare, also discussed option of Cetaphil moisturizers if needed -advised patient to stay well hydrated especially with carfilzamib -discussed that factors such as certain medications, caffeine,certain foods, seasonal changes, winter months, or alcohol may contribute to dry skin/ rosacea  -continue maintenance Carfilzomib and Revlimid for high risk myeloma -advised patient to connect with [name] to let us know details of upcoming dental procedure and we will plan treatments  around her dental procedures.  -Hold Zometa for now pending dental procedure and dental clearance. -it has been 7 months since her last PET scan in February.  -discussed option of PET for further evaluation -patient would like to hold off on PET scan at this time  FOLLOW UP: Hold Zometa for now pending dental procedure and dental clearance. Continue carfilzomib every 2 weeks as per integrated scheduling. Port flush and labs with each treatment MD visit in 6 weeks  The total time spent in the appointment was *** minutes* .  All of the patient's questions were answered with apparent satisfaction. The patient knows to call the clinic with any problems, questions or concerns.   Wyvonnia Lora MD MS AAHIVMS [facility] Hematology/Oncology Physician [facility]  .*Total Encounter Time as defined by the Centers for Medicare and Medicaid Services includes, in addition to the face-to-face time of a patient visit (documented in the note above) non-face-to-face time: obtaining and reviewing outside history, ordering and reviewing medications, tests or procedures, care coordination (communications with other  health care professionals or caregivers) and documentation in the medical record.    I,Mitra Faeizi,acting as a Neurosurgeon for Wyvonnia Lora, MD.,have documented all relevant documentation on the behalf of Wyvonnia Lora, MD,as directed by  Wyvonnia Lora, MD while in the presence of Wyvonnia Lora, MD.  ***

## 2023-08-27 NOTE — Patient Instructions (Signed)
[facility]  Discharge Instructions: Thank you for choosing [facility] to provide your oncology and hematology care.   If you have a lab appointment with the Cancer Center, please go directly to the Cancer Center and check in at the registration area.   Wear comfortable clothing and clothing appropriate for easy access to any Portacath or PICC line.   We strive to give you quality time with your provider. You may need to reschedule your appointment if you arrive late (15 or more minutes).  Arriving late affects you and other patients whose appointments are after yours.  Also, if you miss three or more appointments without notifying the office, you may be dismissed from the clinic at the provider's discretion.      For prescription refill requests, have your pharmacy contact our office and allow 72 hours for refills to be completed.    Today you received the following chemotherapy and/or immunotherapy agents: carfilzomib      To help prevent nausea and vomiting after your treatment, we encourage you to take your nausea medication as directed.  BELOW ARE SYMPTOMS THAT SHOULD BE REPORTED IMMEDIATELY: *FEVER GREATER THAN 100.4 F (38 C) OR HIGHER *CHILLS OR SWEATING *NAUSEA AND VOMITING THAT IS NOT CONTROLLED WITH YOUR NAUSEA MEDICATION *UNUSUAL SHORTNESS OF BREATH *UNUSUAL BRUISING OR BLEEDING *URINARY PROBLEMS (pain or burning when urinating, or frequent urination) *BOWEL PROBLEMS (unusual diarrhea, constipation, pain near the anus) TENDERNESS IN MOUTH AND THROAT WITH OR WITHOUT PRESENCE OF ULCERS (sore throat, sores in mouth, or a toothache) UNUSUAL RASH, SWELLING OR PAIN  UNUSUAL VAGINAL DISCHARGE OR ITCHING   Items with * indicate a potential emergency and should be followed up as soon as possible or go to the Emergency Department if any problems should occur.  Please show the CHEMOTHERAPY ALERT CARD or IMMUNOTHERAPY ALERT CARD at  check-in to the Emergency Department and triage nurse.  Should you have questions after your visit or need to cancel or reschedule your appointment, please contact [facility]  Dept: [phone]  and follow the prompts.  Office hours are 8:00 a.m. to 4:30 p.m. Monday - Friday. Please note that voicemails left after 4:00 p.m. may not be returned until the following business day.  We are closed weekends and major holidays. You have access to a nurse at all times for urgent questions. Please call the main number to the clinic Dept: [phone] and follow the prompts.   For any non-urgent questions, you may also contact your provider using MyChart. We now offer e-Visits for anyone 69 and older to request care online for non-urgent symptoms. For details visit [email or web address].   Also download the MyChart app! Go to the app store, search "MyChart", open the app, select [facility], and log in with your MyChart username and password.

## 2023-08-27 NOTE — Progress Notes (Signed)
Patient seen by Dr. Addison Naegeli are within treatment parameters.  Labs reviewed: and are not all within treatment parameters. Dr Candise Che aware ANC: 1.4  Per physician team, patient is ready for treatment. Please note that modifications are being made to the treatment plan including Hold Zometa for dental procedure

## 2023-08-28 ENCOUNTER — Other Ambulatory Visit: Payer: Self-pay

## 2023-08-31 ENCOUNTER — Other Ambulatory Visit: Payer: Self-pay | Admitting: Hematology

## 2023-08-31 DIAGNOSIS — E119 Type 2 diabetes mellitus without complications: Secondary | ICD-10-CM | POA: Diagnosis not present

## 2023-08-31 DIAGNOSIS — C9000 Multiple myeloma not having achieved remission: Secondary | ICD-10-CM

## 2023-08-31 DIAGNOSIS — H40013 Open angle with borderline findings, low risk, bilateral: Secondary | ICD-10-CM | POA: Diagnosis not present

## 2023-08-31 DIAGNOSIS — H04123 Dry eye syndrome of bilateral lacrimal glands: Secondary | ICD-10-CM | POA: Diagnosis not present

## 2023-08-31 DIAGNOSIS — C7951 Secondary malignant neoplasm of bone: Secondary | ICD-10-CM

## 2023-08-31 DIAGNOSIS — H353132 Nonexudative age-related macular degeneration, bilateral, intermediate dry stage: Secondary | ICD-10-CM | POA: Diagnosis not present

## 2023-08-31 LAB — HM DIABETES EYE EXAM

## 2023-09-01 ENCOUNTER — Encounter: Payer: Self-pay | Admitting: Hematology

## 2023-09-01 ENCOUNTER — Telehealth: Payer: Self-pay | Admitting: Hematology

## 2023-09-01 NOTE — Telephone Encounter (Signed)
Left patient a message regarding rescheduled appointment due to provider being out of office

## 2023-09-09 ENCOUNTER — Ambulatory Visit: Payer: Medicare PPO

## 2023-09-09 ENCOUNTER — Other Ambulatory Visit: Payer: Self-pay | Admitting: Hematology

## 2023-09-09 VITALS — Ht 59.0 in | Wt 151.0 lb

## 2023-09-09 DIAGNOSIS — Z0000 Encounter for general adult medical examination without abnormal findings: Secondary | ICD-10-CM | POA: Diagnosis not present

## 2023-09-09 DIAGNOSIS — C9000 Multiple myeloma not having achieved remission: Secondary | ICD-10-CM

## 2023-09-09 NOTE — Patient Instructions (Addendum)
Faith Orr , Thank you for taking time to come for your Medicare Wellness Visit. I appreciate your ongoing commitment to your health goals. Please review the following plan we discussed and let me know if I can assist you in the future.   Referrals/Orders/Follow-Ups/Clinician Recommendations:   This is a list of the screening recommended for you and due dates:  Health Maintenance  Topic Date Due   Zoster (Shingles) Vaccine (1 of 2) 07/10/1960   Complete foot exam   12/06/2019   Yearly kidney health urinalysis for diabetes  12/09/2019   DTaP/Tdap/Td vaccine (2 - Tdap) 04/18/2020   Colon Cancer Screening  10/21/2020   Hemoglobin A1C  04/23/2023   Flu Shot  07/15/2023   COVID-19 Vaccine (4 - 2023-24 season) 08/15/2023   Yearly kidney function blood test for diabetes  08/26/2024   Eye exam for diabetics  08/30/2024   Medicare Annual Wellness Visit  09/08/2024   Pneumonia Vaccine  Completed   DEXA scan (bone density measurement)  Completed   HPV Vaccine  Aged Out   Opioid Pain Medicine Management Opioid pain medicines are strong medicines that are used to treat bad or very bad pain. When you take them for a short time, they can help you: Sleep better. Do better in physical therapy. Feel better during the first few days after you get hurt. Recover from surgery. Only take these medicines if a doctor says that you can. You should only take them for a short time. This is because opioids can be very addictive. This means that they are hard to stop taking. The longer you take opioids, the harder it may be to stop taking them. What are the risks? Opioids can cause problems (side effects). Taking them for more than 3 days raises your chance of problems, such as: Trouble pooping (constipation). Feeling sick to your stomach (nausea). Vomiting. Feeling very sleepy. Confusion. Not being able to stop taking the medicine. Breathing problems. Taking opioids for a long time can make it hard for you to  do daily tasks. It can also put you at risk for: Car accidents. Depression. Suicide. Heart attack. Taking too much of the medicine (overdose). This can lead to death. What is a pain treatment plan? A pain treatment plan is a plan made by you and your doctor. Work with your doctor to make a plan for treating your pain. To help you do this: Talk about the goals of your treatment, including: How much pain you might expect to have. How you will manage the pain. Talk about the risks and benefits of taking these medicines for your condition. Remember that a good treatment plan uses more than one approach and lowers the risks of side effects. Tell your doctor about the amount of medicines you take and about any drug or alcohol use. Get your pain medicine prescriptions from only one doctor. Pain can be managed with other treatments. Work with your doctor to find other ways to help your pain, such as: Physical therapy or doing gentle exercises. Counseling. Eating healthy foods. Massage. Meditation. Other pain medicines. How to use opioid pain medicine safely Taking medicine Take your pain medicine exactly as told by your doctor. Take it only when you need it. If your pain is not too bad, you may take less medicine if your doctor allows. If you have no pain, do not take the medicine unless your doctor tells you to take it. If your pain is very bad, do not take more medicine  than your doctor told you to take. Call your doctor to know what to do. Write down the times when you take your pain medicine. Look at the times before you take your next dose. Take other over-the-counter or prescription medicines only as told by your doctor. Keeping yourself and others safe  While you are taking opioids: Do not drive, use machines, or power tools. Do not sign important papers (legal documents). Do not drink alcohol. Do not take sleeping pills. Do not take care of children by yourself. Do not do  activities where you need to climb or be in high places, like working on a ladder. Do not go to a lake, river, ocean, swimming pool, or hot tub. Keep your opioids locked up or in a place where children cannot reach them. Do not share your pain medicine with anyone. Stopping your use of opioids If you have been taking opioids for more than a few weeks, you may need to slowly decrease (taper) how much you take until you stop taking them. Doing this can lower your chance of having symptoms.  Symptoms that come from suddenly stopping the use of opioids include: Pain and cramping in your belly (abdomen). Feeling sick to your stomach (nausea).z Sweating. Feeling very sleepy. Feeling restless. Shaking you cannot control (tremors). Cravings for the medicine. Do not try to stop taking them by yourself. Work with your doctor to stop. Your doctor will help you take less until you are not taking the medicine at all. Getting rid of unused pills Do not save any pills that you did not use. Get rid of the pills by: Taking them to a take-back program in your area. Bringing them to a pharmacy that receives unused pills. Flushing them down the toilet. Check the label or package insert of your medicine to see whether this is safe to do. Throwing them in the trash. Check the label or package insert of your medicine to see whether this is safe to do. If it is safe to throw them out: Take the pills out of their container. Put the pills into a container you can seal. Mix the pills with used coffee grounds, food scraps, dirt, or cat litter. Put this in the trash. Follow these instructions at home: Activity Do exercises as told by your doctor. Avoid doing things that make your pain worse. Return to your normal activities as told by your doctor. Ask your doctor what activities are safe for you. General instructions You may need to take these actions to prevent or treat constipation: Drink enough fluid to keep  your pee (urine) pale yellow. Take over-the-counter or prescription medicines. Eat foods that are high in fiber. These include beans, whole grains, and fresh fruits and vegetables. Limit foods that are high in fat and sugar. These include fried or sweet foods. Keep all follow-up visits. Where to find support If you have been taking opioids for a long time, get help from a local support group or counselor. Ask your doctor about this. Where to find more information Centers for Disease Control and Prevention (CDC): FootballExhibition.com.br [place] Food and Drug Administration (FDA): PumpkinSearch.com.ee Get help right away if: You may have taken too much of an opioid (overdosed). Common symptoms of an overdose: Your breathing is slower or more shallow than normal. You have a very slow heartbeat. Your speech is not normal. You vomit or you feel as if you may vomit. The black centers of your eyes (pupils) are smaller than normal. You have  other potential symptoms: You feel very confused. You faint. You are very sleepy. You have cold skin. You have blue lips or fingernails. You have thoughts of harming yourself or harming others. These symptoms may be an emergency. Get help right away. Call your local emergency services (911 in the [place] Do not wait to see if the symptoms will go away. Do not drive yourself to the hospital. Get help right away if you feel like you may hurt yourself or others, or have thoughts about taking your own life. Go to your nearest emergency room or: Call your local emergency services (911 in the [place] Call the [facility] at 720-228-9875. Call a suicide crisis helpline, such as the National Suicide Prevention Lifeline at 351-075-5312 or 988 in the [place] This is open 24 hours a day. Text the Crisis Text Line at 712-787-2938. Summary Opioid are strong medicines that are used to treat bad or very bad pain. A pain treatment plan is a plan made by you and your doctor. Work  with your doctor to make a plan for treating your pain. If you think that you or someone else may have taken too much of an opioid, get help right away. This information is not intended to replace advice given to you by your health care provider. Make sure you discuss any questions you have with your health care provider. Document Revised: 06/25/2021 Document Reviewed: 03/12/2021 [employer] Patient Education  2024 [employer]  Advanced directives: (Copy Requested) Please bring a copy of your health care power of attorney and living will to the office to be added to your chart at your convenience.  Next Medicare Annual Wellness Visit scheduled for next year: Yes

## 2023-09-09 NOTE — Progress Notes (Signed)
Subjective:   Faith Orr is a 82 y.o. female who presents for Medicare Annual (Subsequent) preventive examination.  Visit Complete: Virtual  I connected with  Faith Orr on 09/09/23 by a audio enabled telemedicine application and verified that I am speaking with the correct person using two identifiers.  Patient Location: Home  Provider Location: Home Office  I discussed the limitations of evaluation and management by telemedicine. The patient expressed understanding and agreed to proceed.   Because this visit was a virtual/telehealth visit, some criteria may be missing or patient reported. Any vitals not documented were not able to be obtained and vitals that have been documented are patient reported.   Cardiac Risk Factors include: advanced age (>67men, >4 women);diabetes mellitus     Objective:    Today's Vitals   09/09/23 1443  Weight: 151 lb (68.5 kg)  Height: 4\' 11"  (1.499 m)   Body mass index is 30.5 kg/m.     09/09/2023    2:54 PM 07/16/2023    8:37 AM 07/02/2023   11:41 AM 02/26/2023   11:13 AM 02/16/2023    9:37 AM 02/12/2023    3:10 PM 07/02/2022    1:41 PM  Advanced Directives  Does Patient Have a Medical Advance Directive? Yes Yes Yes Yes Yes Yes Yes  Type of Estate agent of [place];Living will   Healthcare Power of [place];Living will     Does patient want to make changes to medical advance directive?  No - Patient declined No - Patient declined  No - Patient declined No - Patient declined No - Patient declined  Copy of Healthcare Power of Attorney in Chart? No - copy requested   No - copy requested     Would patient like information on creating a medical advance directive?    No - Patient declined       Current Medications (verified) Outpatient Encounter Medications as of 09/09/2023  Medication Sig   Acetaminophen (TYLENOL PO) Take by mouth as needed.   acyclovir (ZOVIRAX) 400 MG tablet TAKE 1 TABLET(400 MG) BY MOUTH TWICE  DAILY   Blood Glucose Monitoring Suppl (FREESTYLE FREEDOM LITE) W/DEVICE KIT Use to check blood sugars twice a day Dx 250.00   Calcium Carbonate-Vitamin D 600-400 MG-UNIT tablet Take 1 tablet by mouth 2 (two) times daily.   dexamethasone (DECADRON) 4 MG tablet Take 5 tablets (20 mg total) by mouth once a week. On D22 of each cycle of treatment   ELIQUIS 2.5 MG TABS tablet TAKE 1 TABLET(2.5 MG) BY MOUTH TWICE DAILY   fentaNYL (DURAGESIC) 12 MCG/HR Place 1 patch onto the skin every 3 (three) days.   fluticasone (FLONASE) 50 MCG/ACT nasal spray Place 1 spray into both nostrils daily.   glucose blood (FREESTYLE LITE) test strip CHECK BLOOD SUGAR TWICE DAILY AS DIRECTED Dx 250.00   Lancets (FREESTYLE) lancets Use twice daily to check sugars.   lenalidomide (REVLIMID) 5 MG capsule TAKE 1 CAPSULE BY MOUTH DAILY  FOR 21 DAYS, THEN 7 DAYS OFF   lidocaine-prilocaine (EMLA) cream APPLY 1 APPLICATION TO THE AFFECTED AREA AS NEEDED. USE PRIOR TO PORT ACCESS   meclizine (ANTIVERT) 25 MG tablet Take 1 tablet (25 mg total) by mouth every 8 (eight) hours as needed for dizziness.   metFORMIN (GLUCOPHAGE-XR) 500 MG 24 hr tablet Take 1 tablet (500 mg total) by mouth 2 (two) times daily with a meal.   metroNIDAZOLE (METROGEL) 1 % gel Apply topically in the morning and at  bedtime.   Multiple Vitamins-Minerals (ICAPS) CAPS Take 1 capsule by mouth daily after breakfast.   ondansetron (ZOFRAN) 8 MG tablet Take 1 tablet (8 mg total) by mouth 2 (two) times daily as needed (Nausea or vomiting).   Oxycodone HCl 10 MG TABS Take 1 tablet (10 mg total) by mouth every 6 (six) hours as needed.   pantoprazole (PROTONIX) 20 MG tablet TAKE 1 TABLET(20 MG) BY MOUTH DAILY   polyethylene glycol (MIRALAX / GLYCOLAX) packet Take 17 g by mouth daily after breakfast.   potassium chloride SA (KLOR-CON M) 20 MEQ tablet Take 2 tablets (40 mEq total) by mouth 2 (two) times daily.   prochlorperazine (COMPAZINE) 10 MG tablet Take 1 tablet (10  mg total) by mouth every 6 (six) hours as needed (Nausea or vomiting).   senna-docusate (SENNA S) 8.6-50 MG tablet Take 2 tablets by mouth at bedtime.   sertraline (ZOLOFT) 100 MG tablet Take 1 tablet (100 mg total) by mouth daily.   simvastatin (ZOCOR) 20 MG tablet Take 1 tablet (20 mg total) by mouth daily at 6 PM.   Vitamin D, Ergocalciferol, (DRISDOL) 1.25 MG (50000 UNIT) CAPS capsule TAKE 1 CAPSULE BY MOUTH EVERY 7 DAYS   Facility-Administered Encounter Medications as of 09/09/2023  Medication   [MAR Hold] heparin lock flush 100 unit/mL    Allergies (verified) Levofloxacin, Penicillins, Aleve [naproxen sodium], and Sulfonamide derivatives   History: Past Medical History:  Diagnosis Date   Allergy    seasonal   Asthma    DEPRESSION    DIABETES MELLITUS, TYPE II    Diverticulosis    HYPERLIPIDEMIA    Macular degeneration of left eye    mild, Dr.Hecker   Obesity, unspecified    Osteoarthritis of both knees    OSTEOPENIA    Osteopenia    URINARY INCONTINENCE    Past Surgical History:  Procedure Laterality Date   CATARACT EXTRACTION Left 05/24/2018   CESAREAN SECTION  01/1973   CYSTOSCOPY/URETEROSCOPY/HOLMIUM LASER/STENT PLACEMENT Right 09/20/2019   Procedure: CYSTOSCOPY/URETEROSCOPY/HOLMIUM LASER/STENT PLACEMENT;  Surgeon: [name], MD;  Location: [facility];  Service: Urology;  Laterality: Right;   FRACTURE SURGERY     IR BONE MARROW BIOPSY & ASPIRATION  02/16/2023   IR IMAGING GUIDED PORT INSERTION  02/20/2019   left wrist surgery  2008   By Dr. Yisroel Ramming   right ankle  1994   Family History  Problem Relation Age of Onset   Diabetes Father    Hyperlipidemia Father    Heart disease Father    Cancer Father    Hypertension Father    Colon cancer Paternal Grandmother 14   Osteoporosis Mother    Protein S deficiency Mother    Hyperlipidemia Mother    Multiple sclerosis Daughter    Cancer Other        bladder   Breast cancer Neg Hx    Social  History   Socioeconomic History   Marital status: Married    Spouse name: Not on file   Number of children: 1   Years of education: Not on file   Highest education level: Not on file  Occupational History    Employer: [employer]  Tobacco Use   Smoking status: Never   Smokeless tobacco: Never   Tobacco comments:    Lives with partner [name]) and son  Vaping Use   Vaping status: Never Used  Substance and Sexual Activity   Alcohol use: No    Alcohol/week:  0.0 standard drinks of alcohol   Drug use: No   Sexual activity: Never    Partners: Female    Birth control/protection: Post-menopausal    Comment: Lives with female partner (annette hicks) and 27 yo son  Other Topics Concern   Not on file  Social History Narrative   Not on file   Social Determinants of Health   Financial Resource Strain: Low Risk  (09/09/2023)   Overall Financial Resource Strain (CARDIA)    Difficulty of Paying Living Expenses: Not hard at all  Food Insecurity: No Food Insecurity (09/09/2023)   Hunger Vital Sign    Worried About Running Out of Food in the Last Year: Never true    Ran Out of Food in the Last Year: Never true  Transportation Needs: No Transportation Needs (09/09/2023)   PRAPARE - Administrator, Civil Service (Medical): No    Lack of Transportation (Non-Medical): No  Physical Activity: Inactive (09/09/2023)   Exercise Vital Sign    Days of Exercise per Week: 0 days    Minutes of Exercise per Session: 0 min  Stress: No Stress Concern Present (09/09/2023)   [employer] of Occupational Health - Occupational Stress Questionnaire    Feeling of Stress : Not at all  Social Connections: Socially Integrated (09/09/2023)   Social Connection and Isolation Panel [NHANES]    Frequency of Communication with Friends and Family: More than three times a week    Frequency of Social Gatherings with Friends and Family: More than three times a week    Attends Religious  Services: More than 4 times per year    Active Member of [employer] or Organizations: Yes    Attends Engineer, structural: More than 4 times per year    Marital Status: Married    Tobacco Counseling Counseling given: Not Answered Tobacco comments: Lives with partner [name]) and son   Clinical Intake:  Pre-visit preparation completed: Yes  Pain : No/denies pain     BMI - recorded: 30.5 Nutritional Status: BMI > 30  Obese Nutritional Risks: None Diabetes: Yes CBG done?: No Did pt. bring in CBG monitor from home?: No  How often do you need to have someone help you when you read instructions, pamphlets, or other written materials from your doctor or pharmacy?: 1 - Never  Interpreter Needed?: No  Information entered by :: Theresa Mulligan LPN   Activities of Daily Living    09/09/2023    2:51 PM 02/16/2023    9:34 AM  In your present state of health, do you have any difficulty performing the following activities:  Hearing? 0 0  Vision? 0 1  Difficulty concentrating or making decisions? 0 1  Walking or climbing stairs? 1 0  Comment Uses a Cane, Set designer or bathing? 0 0  Doing errands, shopping? Holiday representative and eating ? N   Using the Toilet? N   In the past six months, have you accidently leaked urine? N   Do you have problems with loss of bowel control? N   Managing your Medications? N   Managing your Finances? N   Housekeeping or managing your Housekeeping? N     Patient Care Team: Myrlene Broker, MD as PCP - General (Internal Medicine) Hart Carwin, MD (Inactive) as Consulting Physician (Gastroenterology) Jerene Bears, MD as Consulting Physician (Gynecology) Marcene Corning, MD as Consulting Physician (Orthopedic Surgery) Waymon Budge, MD as Consulting Physician (  Pulmonary Disease) Mateo Flow, MD (Ophthalmology)  Indicate any recent Medical Services you may have received from other than [facility] providers in  the past year (date may be approximate).     Assessment:   This is a routine wellness examination for Faith Orr.  Hearing/Vision screen Hearing Screening - Comments:: Denies hearing difficulties   Vision Screening - Comments:: Wears rx glasses - up to date with routine eye exams with  Dr Benjamine Mola   Goals Addressed               This Visit's Progress     Stay well (pt-stated)        Stay independent as possible       Depression Screen    09/09/2023    2:51 PM 07/23/2023    2:56 PM 02/27/2022    3:19 PM 01/06/2022    4:00 PM 10/25/2017    2:32 PM 10/16/2016   10:58 AM 08/30/2015    2:45 PM  PHQ 2/9 Scores  PHQ - 2 Score 0 0 0 0 0 0 0  PHQ- 9 Score 0 0 0 0       Fall Risk    09/09/2023    2:52 PM 07/23/2023    2:56 PM 02/27/2022    3:15 PM 01/06/2022    3:59 PM 10/25/2017    2:32 PM  Fall Risk   Falls in the past year? 0 0 1 0 No  Comment    pt did have a syncope episode late december   Number falls in past yr: 0 0 1 0   Injury with Fall? 0 0 0 0   Risk for fall due to : No Fall Risks  Impaired balance/gait    Follow up Falls prevention discussed Falls evaluation completed Falls evaluation completed      MEDICARE RISK AT HOME: Medicare Risk at Home Any stairs in or around the home?: Yes If so, are there any without handrails?: No Home free of loose throw rugs in walkways, pet beds, electrical cords, etc?: Yes Adequate lighting in your home to reduce risk of falls?: Yes Life alert?: No Use of a cane, walker or w/c?: Yes Grab bars in the bathroom?: Yes Shower chair or bench in shower?: Yes Elevated toilet seat or a handicapped toilet?: No  TIMED UP AND GO:  Was the test performed?  No    Cognitive Function:        09/09/2023    2:54 PM  6CIT Screen  What Year? 0 points  What month? 0 points  What time? 0 points  Count back from 20 0 points  Months in reverse 0 points  Repeat phrase 0 points  Total Score 0 points    Immunizations Immunization History   Administered Date(s) Administered   Fluad Quad(high Dose 65+) 09/11/2019, 09/11/2020, 09/18/2021, 10/23/2022   Influenza Split 09/05/2012, 10/07/2013, 09/09/2014   Influenza Whole 09/13/2009, 09/02/2010, 08/24/2011   Influenza, High Dose Seasonal PF 10/04/2015, 09/10/2016, 08/04/2017, 08/11/2018   PFIZER(Purple Top)SARS-COV-2 Vaccination 02/09/2020, 03/05/2020, 08/28/2020   PPD Test 06/22/2011, 05/16/2013   Pneumococcal Conjugate-13 12/03/2014   Pneumococcal Polysaccharide-23 07/18/2008   Td 04/18/2010   Zoster, Live 09/13/2008    TDAP status: Due, Education has been provided regarding the importance of this vaccine. Advised may receive this vaccine at local pharmacy or Health Dept. Aware to provide a copy of the vaccination record if obtained from local pharmacy or Health Dept. Verbalized acceptance and understanding.  Flu Vaccine status: Due, Education has been  provided regarding the importance of this vaccine. Advised may receive this vaccine at local pharmacy or Health Dept. Aware to provide a copy of the vaccination record if obtained from local pharmacy or Health Dept. Verbalized acceptance and understanding.  Pneumococcal vaccine status: Up to date  Covid-19 vaccine status: Declined, Education has been provided regarding the importance of this vaccine but patient still declined. Advised may receive this vaccine at local pharmacy or Health Dept.or vaccine clinic. Aware to provide a copy of the vaccination record if obtained from local pharmacy or Health Dept. Verbalized acceptance and understanding.  Qualifies for Shingles Vaccine? Yes   Zostavax completed No   Shingrix Completed?: No.    Education has been provided regarding the importance of this vaccine. Patient has been advised to call insurance company to determine out of pocket expense if they have not yet received this vaccine. Advised may also receive vaccine at local pharmacy or Health Dept. Verbalized acceptance and  understanding.  Screening Tests Health Maintenance  Topic Date Due   Zoster Vaccines- Shingrix (1 of 2) 07/10/1960   FOOT EXAM  12/06/2019   Diabetic kidney evaluation - Urine ACR  12/09/2019   DTaP/Tdap/Td (2 - Tdap) 04/18/2020   Colonoscopy  10/21/2020   HEMOGLOBIN A1C  04/23/2023   INFLUENZA VACCINE  07/15/2023   COVID-19 Vaccine (4 - 2023-24 season) 08/15/2023   Diabetic kidney evaluation - eGFR measurement  08/26/2024   OPHTHALMOLOGY EXAM  08/30/2024   Medicare Annual Wellness (AWV)  09/08/2024   Pneumonia Vaccine 35+ Years old  Completed   DEXA SCAN  Completed   HPV VACCINES  Aged Out    Health Maintenance  Health Maintenance Due  Topic Date Due   Zoster Vaccines- Shingrix (1 of 2) 07[birth year]10[birth year]1962   FOOT EXAM  12/06/2019   Diabetic kidney evaluation - Urine ACR  12/09/2019   DTaP/Tdap/Td (2 - Tdap) 04/18/2020   Colonoscopy  10/21/2020   HEMOGLOBIN A1C  04/23/2023   INFLUENZA VACCINE  07/15/2023   COVID-19 Vaccine (4 - 2023-24 season) 08/15/2023    Colorectal cancer screening: Referral to GI placed Patient declined. Pt aware the office will call re: appt.    Bone Density status: Completed 06/03/15/. Results reflect: Bone density results: OSTEOPENIA. Repeat every   years.    Additional Screening:    Vision Screening: Recommended annual ophthalmology exams for early detection of glaucoma and other disorders of the eye. Is the patient up to date with their annual eye exam?  Yes  Who is the provider or what is the name of the office in which the patient attends annual eye exams? Dr Benjamine Mola If pt is not established with a provider, would they like to be referred to a provider to establish care? No .   Dental Screening: Recommended annual dental exams for proper oral hygiene  Diabetic Foot Exam: Diabetic Foot Exam: Overdue, Pt has been advised about the importance in completing this exam. Pt is scheduled for diabetic foot exam on Followed by PCP.  Community Resource  Referral / Chronic Care Management:  CRR required this visit?  No   CCM required this visit?  No     Plan:     I have personally reviewed and noted the following in the patient's chart:   Medical and social history Use of alcohol, tobacco or illicit drugs  Current medications and supplements including opioid prescriptions. Patient is currently taking opioid prescriptions. Information provided to patient regarding non-opioid alternatives. Patient advised to discuss non-opioid treatment plan  with their provider. Functional ability and status Nutritional status Physical activity Advanced directives List of other physicians Hospitalizations, surgeries, and ER visits in previous 12 months Vitals Screenings to include cognitive, depression, and falls Referrals and appointments  In addition, I have reviewed and discussed with patient certain preventive protocols, quality metrics, and best practice recommendations. A written personalized care plan for preventive services as well as general preventive health recommendations were provided to patient.     Tillie Rung, LPN   1/61/0960   After Visit Summary: (MyChart) Due to this being a telephonic visit, the after visit summary with patients personalized plan was offered to patient via MyChart   Nurse Notes: None

## 2023-09-10 ENCOUNTER — Other Ambulatory Visit: Payer: Self-pay

## 2023-09-10 ENCOUNTER — Encounter: Payer: Self-pay | Admitting: Hematology

## 2023-09-10 ENCOUNTER — Other Ambulatory Visit: Payer: Medicare PPO

## 2023-09-10 ENCOUNTER — Ambulatory Visit: Payer: Medicare PPO

## 2023-09-10 ENCOUNTER — Inpatient Hospital Stay: Payer: Medicare PPO

## 2023-09-10 VITALS — BP 127/67 | HR 78 | Temp 98.7°F | Resp 18 | Wt 148.5 lb

## 2023-09-10 DIAGNOSIS — Z7189 Other specified counseling: Secondary | ICD-10-CM

## 2023-09-10 DIAGNOSIS — C9000 Multiple myeloma not having achieved remission: Secondary | ICD-10-CM | POA: Diagnosis not present

## 2023-09-10 DIAGNOSIS — Z79899 Other long term (current) drug therapy: Secondary | ICD-10-CM | POA: Diagnosis not present

## 2023-09-10 DIAGNOSIS — Z5112 Encounter for antineoplastic immunotherapy: Secondary | ICD-10-CM | POA: Diagnosis not present

## 2023-09-10 LAB — CMP (CANCER CENTER ONLY)
ALT: 9 U/L (ref 0–44)
AST: 10 U/L — ABNORMAL LOW (ref 15–41)
Albumin: 3.7 g/dL (ref 3.5–5.0)
Alkaline Phosphatase: 90 U/L (ref 38–126)
Anion gap: 8 (ref 5–15)
BUN: 16 mg/dL (ref 8–23)
CO2: 20 mmol/L — ABNORMAL LOW (ref 22–32)
Calcium: 9.5 mg/dL (ref 8.9–10.3)
Chloride: 108 mmol/L (ref 98–111)
Creatinine: 1 mg/dL (ref 0.44–1.00)
GFR, Estimated: 57 mL/min — ABNORMAL LOW (ref >60)
Glucose, Bld: 246 mg/dL — ABNORMAL HIGH (ref 70–99)
Potassium: 3.6 mmol/L (ref 3.5–5.1)
Sodium: 136 mmol/L (ref 135–145)
Total Bilirubin: 0.7 mg/dL (ref 0.3–1.2)
Total Protein: 6.2 g/dL — ABNORMAL LOW (ref 6.5–8.1)

## 2023-09-10 LAB — CBC WITH DIFFERENTIAL (CANCER CENTER ONLY)
Abs Immature Granulocytes: 0.01 10*3/uL (ref 0.00–0.07)
Basophils Absolute: 0 10*3/uL (ref 0.0–0.1)
Basophils Relative: 1 %
Eosinophils Absolute: 0.1 10*3/uL (ref 0.0–0.5)
Eosinophils Relative: 3 %
HCT: 36.1 % (ref 36.0–46.0)
Hemoglobin: 12.1 g/dL (ref 12.0–15.0)
Immature Granulocytes: 0 %
Lymphocytes Relative: 25 %
Lymphs Abs: 1.1 10*3/uL (ref 0.7–4.0)
MCH: 34.2 pg — ABNORMAL HIGH (ref 26.0–34.0)
MCHC: 33.5 g/dL (ref 30.0–36.0)
MCV: 102 fL — ABNORMAL HIGH (ref 80.0–100.0)
Monocytes Absolute: 1 10*3/uL (ref 0.1–1.0)
Monocytes Relative: 23 %
Neutro Abs: 2.1 10*3/uL (ref 1.7–7.7)
Neutrophils Relative %: 48 %
Platelet Count: 135 10*3/uL — ABNORMAL LOW (ref 150–400)
RBC: 3.54 MIL/uL — ABNORMAL LOW (ref 3.87–5.11)
RDW: 13.6 % (ref 11.5–15.5)
WBC Count: 4.3 10*3/uL (ref 4.0–10.5)
nRBC: 0 % (ref 0.0–0.2)

## 2023-09-10 MED ORDER — SODIUM CHLORIDE 0.9% FLUSH
10.0000 mL | Freq: Once | INTRAVENOUS | Status: AC
  Administered 2023-09-10: 10 mL via INTRAVENOUS

## 2023-09-10 MED ORDER — ACETAMINOPHEN 325 MG PO TABS
650.0000 mg | ORAL_TABLET | Freq: Once | ORAL | Status: AC
  Administered 2023-09-10: 650 mg via ORAL
  Filled 2023-09-10: qty 2

## 2023-09-10 MED ORDER — SODIUM CHLORIDE 0.9% FLUSH
10.0000 mL | INTRAVENOUS | Status: DC | PRN
  Administered 2023-09-10: 10 mL

## 2023-09-10 MED ORDER — HEPARIN SOD (PORK) LOCK FLUSH 100 UNIT/ML IV SOLN
500.0000 [IU] | Freq: Once | INTRAVENOUS | Status: AC | PRN
  Administered 2023-09-10: 500 [IU]

## 2023-09-10 MED ORDER — FAMOTIDINE IN NACL 20-0.9 MG/50ML-% IV SOLN
20.0000 mg | Freq: Once | INTRAVENOUS | Status: AC
  Administered 2023-09-10: 20 mg via INTRAVENOUS
  Filled 2023-09-10: qty 50

## 2023-09-10 MED ORDER — PROCHLORPERAZINE MALEATE 10 MG PO TABS
10.0000 mg | ORAL_TABLET | Freq: Once | ORAL | Status: AC
  Administered 2023-09-10: 10 mg via ORAL
  Filled 2023-09-10: qty 1

## 2023-09-10 MED ORDER — SODIUM CHLORIDE 0.9 % IV SOLN: Freq: Once | INTRAVENOUS | Status: AC

## 2023-09-10 MED ORDER — DIPHENHYDRAMINE HCL 25 MG PO CAPS
25.0000 mg | ORAL_CAPSULE | Freq: Once | ORAL | Status: AC
  Administered 2023-09-10: 25 mg via ORAL
  Filled 2023-09-10: qty 1

## 2023-09-10 MED ORDER — DEXTROSE 5 % IV SOLN
36.0000 mg/m2 | Freq: Once | INTRAVENOUS | Status: AC
  Administered 2023-09-10: 60 mg via INTRAVENOUS
  Filled 2023-09-10: qty 30

## 2023-09-10 MED ORDER — DEXAMETHASONE 4 MG PO TABS
8.0000 mg | ORAL_TABLET | Freq: Once | ORAL | Status: AC
  Administered 2023-09-10: 8 mg via ORAL
  Filled 2023-09-10: qty 2

## 2023-09-10 NOTE — Patient Instructions (Signed)
[facility]  Discharge Instructions: Thank you for choosing [facility] to provide your oncology and hematology care.   If you have a lab appointment with the Cancer Center, please go directly to the Cancer Center and check in at the registration area.   Wear comfortable clothing and clothing appropriate for easy access to any Portacath or PICC line.   We strive to give you quality time with your provider. You may need to reschedule your appointment if you arrive late (15 or more minutes).  Arriving late affects you and other patients whose appointments are after yours.  Also, if you miss three or more appointments without notifying the office, you may be dismissed from the clinic at the provider's discretion.      For prescription refill requests, have your pharmacy contact our office and allow 72 hours for refills to be completed.    Today you received the following chemotherapy and/or immunotherapy agents Carfilzomib      To help prevent nausea and vomiting after your treatment, we encourage you to take your nausea medication as directed.  BELOW ARE SYMPTOMS THAT SHOULD BE REPORTED IMMEDIATELY: *FEVER GREATER THAN 100.4 F (38 C) OR HIGHER *CHILLS OR SWEATING *NAUSEA AND VOMITING THAT IS NOT CONTROLLED WITH YOUR NAUSEA MEDICATION *UNUSUAL SHORTNESS OF BREATH *UNUSUAL BRUISING OR BLEEDING *URINARY PROBLEMS (pain or burning when urinating, or frequent urination) *BOWEL PROBLEMS (unusual diarrhea, constipation, pain near the anus) TENDERNESS IN MOUTH AND THROAT WITH OR WITHOUT PRESENCE OF ULCERS (sore throat, sores in mouth, or a toothache) UNUSUAL RASH, SWELLING OR PAIN  UNUSUAL VAGINAL DISCHARGE OR ITCHING   Items with * indicate a potential emergency and should be followed up as soon as possible or go to the Emergency Department if any problems should occur.  Please show the CHEMOTHERAPY ALERT CARD or IMMUNOTHERAPY ALERT CARD at  check-in to the Emergency Department and triage nurse.  Should you have questions after your visit or need to cancel or reschedule your appointment, please contact [facility]  Dept: [phone]  and follow the prompts.  Office hours are 8:00 a.m. to 4:30 p.m. Monday - Friday. Please note that voicemails left after 4:00 p.m. may not be returned until the following business day.  We are closed weekends and major holidays. You have access to a nurse at all times for urgent questions. Please call the main number to the clinic Dept: [phone] and follow the prompts.   For any non-urgent questions, you may also contact your provider using MyChart. We now offer e-Visits for anyone 18 and older to request care online for non-urgent symptoms. For details visit [email or web address].   Also download the MyChart app! Go to the app store, search "MyChart", open the app, select [facility], and log in with your MyChart username and password.   

## 2023-09-13 ENCOUNTER — Other Ambulatory Visit: Payer: Self-pay

## 2023-09-13 ENCOUNTER — Encounter: Payer: Self-pay | Admitting: Hematology

## 2023-09-13 DIAGNOSIS — C9000 Multiple myeloma not having achieved remission: Secondary | ICD-10-CM

## 2023-09-14 ENCOUNTER — Encounter: Payer: Self-pay | Admitting: Hematology

## 2023-09-14 MED ORDER — OXYCODONE HCL 10 MG PO TABS: 10.0000 mg | ORAL_TABLET | Freq: Four times a day (QID) | ORAL | 0 refills | Status: DC | PRN

## 2023-09-23 ENCOUNTER — Inpatient Hospital Stay: Payer: Medicare PPO | Admitting: Hematology

## 2023-09-23 ENCOUNTER — Inpatient Hospital Stay: Payer: Medicare PPO

## 2023-09-23 ENCOUNTER — Inpatient Hospital Stay: Payer: Medicare PPO | Attending: Hematology

## 2023-09-23 VITALS — BP 145/51 | HR 55 | Temp 97.5°F | Resp 18 | Wt 150.2 lb

## 2023-09-23 VITALS — BP 125/67 | HR 52 | Resp 18

## 2023-09-23 DIAGNOSIS — C9000 Multiple myeloma not having achieved remission: Secondary | ICD-10-CM

## 2023-09-23 DIAGNOSIS — Z7189 Other specified counseling: Secondary | ICD-10-CM | POA: Diagnosis not present

## 2023-09-23 DIAGNOSIS — Z79899 Other long term (current) drug therapy: Secondary | ICD-10-CM | POA: Insufficient documentation

## 2023-09-23 DIAGNOSIS — Z5112 Encounter for antineoplastic immunotherapy: Secondary | ICD-10-CM | POA: Insufficient documentation

## 2023-09-23 LAB — CMP (CANCER CENTER ONLY)
ALT: 12 U/L (ref 0–44)
AST: 11 U/L — ABNORMAL LOW (ref 15–41)
Albumin: 3.6 g/dL (ref 3.5–5.0)
Alkaline Phosphatase: 83 U/L (ref 38–126)
Anion gap: 6 (ref 5–15)
BUN: 19 mg/dL (ref 8–23)
CO2: 23 mmol/L (ref 22–32)
Calcium: 9.6 mg/dL (ref 8.9–10.3)
Chloride: 110 mmol/L (ref 98–111)
Creatinine: 1.03 mg/dL — ABNORMAL HIGH (ref 0.44–1.00)
GFR, Estimated: 55 mL/min — ABNORMAL LOW (ref >60)
Glucose, Bld: 212 mg/dL — ABNORMAL HIGH (ref 70–99)
Potassium: 4 mmol/L (ref 3.5–5.1)
Sodium: 139 mmol/L (ref 135–145)
Total Bilirubin: 0.6 mg/dL (ref 0.3–1.2)
Total Protein: 5.9 g/dL — ABNORMAL LOW (ref 6.5–8.1)

## 2023-09-23 LAB — CBC WITH DIFFERENTIAL (CANCER CENTER ONLY)
Abs Immature Granulocytes: 0.01 10*3/uL (ref 0.00–0.07)
Basophils Absolute: 0 10*3/uL (ref 0.0–0.1)
Basophils Relative: 1 %
Eosinophils Absolute: 0 10*3/uL (ref 0.0–0.5)
Eosinophils Relative: 0 %
HCT: 36.3 % (ref 36.0–46.0)
Hemoglobin: 12 g/dL (ref 12.0–15.0)
Immature Granulocytes: 0 %
Lymphocytes Relative: 20 %
Lymphs Abs: 0.8 10*3/uL (ref 0.7–4.0)
MCH: 33.9 pg (ref 26.0–34.0)
MCHC: 33.1 g/dL (ref 30.0–36.0)
MCV: 102.5 fL — ABNORMAL HIGH (ref 80.0–100.0)
Monocytes Absolute: 0.7 10*3/uL (ref 0.1–1.0)
Monocytes Relative: 17 %
Neutro Abs: 2.5 10*3/uL (ref 1.7–7.7)
Neutrophils Relative %: 62 %
Platelet Count: 186 10*3/uL (ref 150–400)
RBC: 3.54 MIL/uL — ABNORMAL LOW (ref 3.87–5.11)
RDW: 13.6 % (ref 11.5–15.5)
WBC Count: 4.1 10*3/uL (ref 4.0–10.5)
nRBC: 0 % (ref 0.0–0.2)

## 2023-09-23 MED ORDER — SODIUM CHLORIDE 0.9% FLUSH
10.0000 mL | INTRAVENOUS | Status: DC | PRN
  Administered 2023-09-23: 10 mL

## 2023-09-23 MED ORDER — SODIUM CHLORIDE 0.9 % IV SOLN: Freq: Once | INTRAVENOUS | Status: AC

## 2023-09-23 MED ORDER — PROCHLORPERAZINE MALEATE 10 MG PO TABS
10.0000 mg | ORAL_TABLET | Freq: Once | ORAL | Status: AC
  Administered 2023-09-23: 10 mg via ORAL
  Filled 2023-09-23: qty 1

## 2023-09-23 MED ORDER — ACETAMINOPHEN 325 MG PO TABS
650.0000 mg | ORAL_TABLET | Freq: Once | ORAL | Status: AC
  Administered 2023-09-23: 650 mg via ORAL
  Filled 2023-09-23: qty 2

## 2023-09-23 MED ORDER — DEXTROSE 5 % IV SOLN
36.0000 mg/m2 | Freq: Once | INTRAVENOUS | Status: AC
  Administered 2023-09-23: 60 mg via INTRAVENOUS
  Filled 2023-09-23: qty 30

## 2023-09-23 MED ORDER — DEXAMETHASONE 4 MG PO TABS
8.0000 mg | ORAL_TABLET | Freq: Once | ORAL | Status: AC
  Administered 2023-09-23: 8 mg via ORAL
  Filled 2023-09-23: qty 2

## 2023-09-23 MED ORDER — HEPARIN SOD (PORK) LOCK FLUSH 100 UNIT/ML IV SOLN
500.0000 [IU] | Freq: Once | INTRAVENOUS | Status: AC | PRN
  Administered 2023-09-23: 500 [IU]

## 2023-09-23 MED ORDER — DIPHENHYDRAMINE HCL 25 MG PO CAPS
25.0000 mg | ORAL_CAPSULE | Freq: Once | ORAL | Status: AC
  Administered 2023-09-23: 25 mg via ORAL
  Filled 2023-09-23: qty 1

## 2023-09-23 MED ORDER — FAMOTIDINE IN NACL 20-0.9 MG/50ML-% IV SOLN
20.0000 mg | Freq: Once | INTRAVENOUS | Status: AC
  Administered 2023-09-23: 20 mg via INTRAVENOUS
  Filled 2023-09-23: qty 50

## 2023-09-23 NOTE — Progress Notes (Signed)
Discussion with patient and Dr. Candise Che in regards to zometa. Patient is due for some dental work and while labs are appropriate for zometa she is going to hold off for now. Parameters for dental work conveyed from Dr. Candise Che to patient. If she can get a specific appointment, she will determine when to do her zometa.  Parameters- no dental work involving jaw bone- tooth socket or root canal- within 1 month of zometa. Cleanings and superficial fillings are ok.

## 2023-09-23 NOTE — Progress Notes (Signed)
HEMATOLOGY/ONCOLOGY CLINIC NOTE  Date of Service: 09/23/23   Patient Care Team: Myrlene Broker, MD as PCP - General (Internal Medicine) Hart Carwin, MD (Inactive) as Consulting Physician (Gastroenterology) Jerene Bears, MD as Consulting Physician (Gynecology) Marcene Corning, MD as Consulting Physician (Orthopedic Surgery) Waymon Budge, MD as Consulting Physician (Pulmonary Disease) Mateo Flow, MD (Ophthalmology)  CHIEF COMPLAINTS/PURPOSE OF VISIT:  Follow-up for continued evaluation and management of multiple myeloma  HISTORY OF PRESENTING ILLNESS:  Please see previous notes for details on initial presentation  Current Treatment: Carfilzomib + Revlimid maintenance.  INTERVAL HISTORY:   Faith Orr is a 82 y.o. female who is here for continued evaluation and management of the patient's multiple myeloma. She is here to start cycle 8 day 15 of her treatment.   Patient was last seen by me on 08/27/2023 and she complained of worsened intermittent back pain, mainly right-sided back pain.   Patient notes she has been doing well overall since our last visit. She does complain of worsened right lower back pain with movement, for the past 2-4 weeks. She notes that heating pad helps her pain. She denies any activity that triggered her back pain. During this visit, she notes her pain is 4/10 and at worse it was 9-10/10. She notes that her pain worsens throughout the day. She denies any recent falls, cough, pulling or pushing any heavy equipment.   Patient notes she has been taking oxycodone for pain, but notes it does not help her back pain.   She has been staying active and has been eating well.   She denies any new infection issues, fever, chills, night sweats, abdominal pain, chest pain, or leg swelling.   Patient notes she has been tolerating her treatment well with mild toxicity of occasional constipation and occasional diarrhea.   Patient notes that  she is one-day behind Revlimid this cycle.   MEDICAL HISTORY:  Past Medical History:  Diagnosis Date   Allergy    seasonal   Asthma    DEPRESSION    DIABETES MELLITUS, TYPE II    Diverticulosis    HYPERLIPIDEMIA    Macular degeneration of left eye    mild, Dr.Hecker   Obesity, unspecified    Osteoarthritis of both knees    OSTEOPENIA    Osteopenia    URINARY INCONTINENCE     SURGICAL HISTORY: Past Surgical History:  Procedure Laterality Date   CATARACT EXTRACTION Left 05/24/2018   CESAREAN SECTION  01/1973   CYSTOSCOPY/URETEROSCOPY/HOLMIUM LASER/STENT PLACEMENT Right 09/20/2019   Procedure: CYSTOSCOPY/URETEROSCOPY/HOLMIUM LASER/STENT PLACEMENT;  Surgeon: [name], MD;  Location: [facility];  Service: Urology;  Laterality: Right;   FRACTURE SURGERY     IR BONE MARROW BIOPSY & ASPIRATION  02/16/2023   IR IMAGING GUIDED PORT INSERTION  02/20/2019   left wrist surgery  2008   By Dr. Yisroel Ramming   right ankle  1994    SOCIAL HISTORY: Social History   Socioeconomic History   Marital status: Married    Spouse name: Not on file   Number of children: 1   Years of education: Not on file   Highest education level: Not on file  Occupational History    Employer: [employer]  Tobacco Use   Smoking status: Never   Smokeless tobacco: Never   Tobacco comments:    Lives with partner [name]) and son  Vaping Use   Vaping status: Never Used  Substance and Sexual  Activity   Alcohol use: No    Alcohol/week: 0.0 standard drinks of alcohol   Drug use: No   Sexual activity: Never    Partners: Female    Birth control/protection: Post-menopausal    Comment: Lives with female partner (annette hicks) and 71 yo son  Other Topics Concern   Not on file  Social History Narrative   Not on file   Social Determinants of Health   Financial Resource Strain: Low Risk  (09/09/2023)   Overall Financial Resource Strain (CARDIA)    Difficulty of Paying  Living Expenses: Not hard at all  Food Insecurity: No Food Insecurity (09/09/2023)   Hunger Vital Sign    Worried About Running Out of Food in the Last Year: Never true    Ran Out of Food in the Last Year: Never true  Transportation Needs: No Transportation Needs (09/09/2023)   PRAPARE - Administrator, Civil Service (Medical): No    Lack of Transportation (Non-Medical): No  Physical Activity: Inactive (09/09/2023)   Exercise Vital Sign    Days of Exercise per Week: 0 days    Minutes of Exercise per Session: 0 min  Stress: No Stress Concern Present (09/09/2023)   [employer] of Occupational Health - Occupational Stress Questionnaire    Feeling of Stress : Not at all  Social Connections: Socially Integrated (09/09/2023)   Social Connection and Isolation Panel [NHANES]    Frequency of Communication with Friends and Family: More than three times a week    Frequency of Social Gatherings with Friends and Family: More than three times a week    Attends Religious Services: More than 4 times per year    Active Member of [employer] or Organizations: Yes    Attends Engineer, structural: More than 4 times per year    Marital Status: Married  Catering manager Violence: Not At Risk (05/08/2021)   Humiliation, Afraid, Rape, and Kick questionnaire    Fear of Current or Ex-Partner: No    Emotionally Abused: No    Physically Abused: No    Sexually Abused: No    FAMILY HISTORY: Family History  Problem Relation Age of Onset   Diabetes Father    Hyperlipidemia Father    Heart disease Father    Cancer Father    Hypertension Father    Colon cancer Paternal Grandmother 83   Osteoporosis Mother    Protein S deficiency Mother    Hyperlipidemia Mother    Multiple sclerosis Daughter    Cancer Other        bladder   Breast cancer Neg Hx     ALLERGIES:  is allergic to levofloxacin, penicillins, aleve [naproxen sodium], and sulfonamide derivatives.  MEDICATIONS:  Current  Outpatient Medications  Medication Sig Dispense Refill   Acetaminophen (TYLENOL PO) Take by mouth as needed.     acyclovir (ZOVIRAX) 400 MG tablet TAKE 1 TABLET(400 MG) BY MOUTH TWICE DAILY 60 tablet 5   Blood Glucose Monitoring Suppl (FREESTYLE FREEDOM LITE) W/DEVICE KIT Use to check blood sugars twice a day Dx 250.00 1 each 0   Calcium Carbonate-Vitamin D 600-400 MG-UNIT tablet Take 1 tablet by mouth 2 (two) times daily.     dexamethasone (DECADRON) 4 MG tablet Take 5 tablets (20 mg total) by mouth once a week. On D22 of each cycle of treatment 20 tablet 5   ELIQUIS 2.5 MG TABS tablet TAKE 1 TABLET(2.5 MG) BY MOUTH TWICE DAILY 60 tablet 2   fentaNYL (DURAGESIC)  12 MCG/HR Place 1 patch onto the skin every 3 (three) days. 10 patch 0   fluticasone (FLONASE) 50 MCG/ACT nasal spray Place 1 spray into both nostrils daily. 48 g 3   glucose blood (FREESTYLE LITE) test strip CHECK BLOOD SUGAR TWICE DAILY AS DIRECTED Dx 250.00 180 each 3   Lancets (FREESTYLE) lancets Use twice daily to check sugars. 100 each 11   lenalidomide (REVLIMID) 5 MG capsule TAKE 1 CAPSULE BY MOUTH DAILY  FOR 21 DAYS, THEN 7 DAYS OFF 21 capsule 0   lidocaine-prilocaine (EMLA) cream APPLY 1 APPLICATION TO THE AFFECTED AREA AS NEEDED. USE PRIOR TO PORT ACCESS 30 g 0   meclizine (ANTIVERT) 25 MG tablet Take 1 tablet (25 mg total) by mouth every 8 (eight) hours as needed for dizziness. 20 tablet 0   metFORMIN (GLUCOPHAGE-XR) 500 MG 24 hr tablet Take 1 tablet (500 mg total) by mouth 2 (two) times daily with a meal. 180 tablet 3   metroNIDAZOLE (METROGEL) 1 % gel Apply topically in the morning and at bedtime. 60 g 1   Multiple Vitamins-Minerals (ICAPS) CAPS Take 1 capsule by mouth daily after breakfast.     ondansetron (ZOFRAN) 8 MG tablet Take 1 tablet (8 mg total) by mouth 2 (two) times daily as needed (Nausea or vomiting). 30 tablet 1   Oxycodone HCl 10 MG TABS Take 1 tablet (10 mg total) by mouth every 6 (six) hours as needed. 90  tablet 0   pantoprazole (PROTONIX) 20 MG tablet TAKE 1 TABLET(20 MG) BY MOUTH DAILY 30 tablet 2   polyethylene glycol (MIRALAX / GLYCOLAX) packet Take 17 g by mouth daily after breakfast.     potassium chloride SA (KLOR-CON M) 20 MEQ tablet Take 2 tablets (40 mEq total) by mouth 2 (two) times daily. 360 tablet 1   prochlorperazine (COMPAZINE) 10 MG tablet Take 1 tablet (10 mg total) by mouth every 6 (six) hours as needed (Nausea or vomiting). 30 tablet 1   senna-docusate (SENNA S) 8.6-50 MG tablet Take 2 tablets by mouth at bedtime. 60 tablet 2   sertraline (ZOLOFT) 100 MG tablet Take 1 tablet (100 mg total) by mouth daily. 90 tablet 3   simvastatin (ZOCOR) 20 MG tablet Take 1 tablet (20 mg total) by mouth daily at 6 PM. 90 tablet 3   Vitamin D, Ergocalciferol, (DRISDOL) 1.25 MG (50000 UNIT) CAPS capsule TAKE 1 CAPSULE BY MOUTH EVERY 7 DAYS 12 capsule 0   No current facility-administered medications for this visit.   Facility-Administered Medications Ordered in Other Visits  Medication Dose Route Frequency Provider Last Rate Last Admin   [MAR Hold] heparin lock flush 100 unit/mL  500 Units Intracatheter Once PRN Johney Maine, MD        REVIEW OF SYSTEMS:    10 Point review of Systems was done is negative except as noted above.   PHYSICAL EXAMINATION: ECOG FS:2 - Symptomatic, <50% confined to bed .LMP 10/09/2012    Wt Readings from Last 3 Encounters:  09/10/23 148 lb 8 oz (67.4 kg)  09/09/23 151 lb (68.5 kg)  08/27/23 151 lb (68.5 kg)   There is no height or weight on file to calculate BMI.    GENERAL:alert, in no acute distress and comfortable SKIN: no acute rashes, no significant lesions EYES: conjunctiva are pink and non-injected, sclera anicteric OROPHARYNX: MMM, no exudates, no oropharyngeal erythema or ulceration NECK: supple, no JVD LYMPH:  no palpable lymphadenopathy in the cervical, axillary or inguinal regions LUNGS:  clear to auscultation b/l with normal  respiratory effort HEART: regular rate & rhythm ABDOMEN:  normoactive bowel sounds , non tender, not distended. Extremity: no pedal edema PSYCH: alert & oriented x 3 with fluent speech NEURO: no focal motor/sensory deficits   LABS     Latest Ref Rng & Units 09/10/2023    1:00 PM 08/27/2023    9:03 AM 08/13/2023    1:53 PM  CBC  WBC 4.0 - 10.5 K/uL 4.3  3.5  3.5   Hemoglobin 12.0 - 15.0 g/dL 29.5  62.1  30.8   Hematocrit 36.0 - 46.0 % 36.1  34.6  34.8   Platelets 150 - 400 K/uL 135  194  123       Latest Ref Rng & Units 09/10/2023    1:00 PM 08/27/2023    9:03 AM 08/13/2023    1:53 PM  CMP  Glucose 70 - 99 mg/dL 657  846  962   BUN 8 - 23 mg/dL 16  18  15    Creatinine 0.44 - 1.00 mg/dL 9.52  8.41  3.24   Sodium 135 - 145 mmol/L 136  141  139   Potassium 3.5 - 5.1 mmol/L 3.6  4.1  3.5   Chloride 98 - 111 mmol/L 108  109  109   CO2 22 - 32 mmol/L 20  24  22    Calcium 8.9 - 10.3 mg/dL 9.5  9.8  9.1   Total Protein 6.5 - 8.1 g/dL 6.2  6.2  6.1   Total Bilirubin 0.3 - 1.2 mg/dL 0.7  0.5  0.6   Alkaline Phos 38 - 126 U/L 90  92  82   AST 15 - 41 U/L 10  11  17    ALT 0 - 44 U/L 9  10  14       09/18/2019 BM Bx Report ([id number])   09/18/2019 FISH Panel    05/30/2019 BM Bx   01/06/2019 BM Bx:     01/06/19 Cytogenetics:      05/30/19 BM Biopsy:   09/18/2019 FISH Panel    09/18/2019 BM Surgical Pathology ([id number])     RADIOGRAPHIC STUDIES: I have personally reviewed the radiological images as listed and agreed with the findings in the report. No results found.   ASSESSMENT & PLAN:   82 y.o. female with  1.  Nonsecretory multiple Myeloma, RISS Stage III  Labs upon initial presentation from 12/08/18, blood counts are normal including WBC at 7.1k, HGB at 13.1, and PLT at 245k. Calcium normal at 10.3. Creatinine normal at 0.63. M spike at 0.5g. 12/13/18 Bone Scan revealed Multifocal uptake throughout the skeleton, consistent with diffuse  metastatic disease. Primary tumor is not specified. 2. Uptake in the proximal right femur, consistent with lytic lesions. 3. Uptake in the ribs bilaterally as described. 4. Lesions in the proximal left humerus. 5. Diffuse uptake throughout the skull consistent with metastatic disease. 6. Right paramedian uptake at the manubrium.  12/13/18 CT Right Femur revealed Numerous lytic lesions involving the right femur and a lytic lesion in the left inferior pubic ramus. Overall appearance is most concerning for multiple myeloma  12/27/18 Pretreatment 24hour UPEP observed an M spike at 18mg , and showed 199mg  total protein/day.  12/27/18 Pretreatment MMP revealed M Protein at 0.5g with IgG Lambda specificity. Kappa:Lambda light chain ratio at 0.13, with Lambda at 40.3. There is less abnormal protein and light chains than I would expect from 30% plasma cells, which suggests hypo-secretory or non-secretory neoplastic plasma cells.  Will have an impact in assessing response. 01/05/19 PET/CT revealed Innumerable lytic lesions in the skeleton compatible with myeloma. Most of the larger lesions are hypermetabolic, for example including a left proximal humeral shaft lesion with maximum SUV of 8.1 and a 2.8 cm lesion in the left T9 vertebral body with maximum SUV 5.1. Most of the smaller lytic lesions, and some of the larger lesions, do not demonstrate accentuated metabolic activity. 2. 1.2 cm in short axis lymph node in the left parapharyngeal space is hypermetabolic with maximum SUV 11.8. I do not see a separate mass in the head and neck to give rise to this hypermetabolic lymph node. 3. Mosaic attenuation in the lower lobes, nonspecific possibly from air trapping. 4.  Aortic Atherosclerosis 5. Heterogeneous activity in the liver, making it hard to exclude small liver lesions. Consider hepatic protocol MRI with and without contrast for definitive assessment. Nonobstructive right nephrolithiasis. Old granulomatous  disease  01/06/19 Bone Marrow biopsy revealed interstitial increase in plasma cells (28% aspirate, 40% CD138 immunohistochemistry). Plasma cells negative for light chains consistent with a non or weakly secretory myeloma   01/06/19 Cytogenetics revealed 37% of cells with trisomy 11 or 11q deletion, and 40.5% of cells with 17p mutation  S/p 5 cycles of KRD treatment  05/31/19 BM Biopsy revealed mild atypical plasmacytosis at 5% with polytypic variation.   06/01/19 PET/CT revealed "Dominant lesion in the LEFT humerus is decreased significantly in metabolic activity. Additional hypermetabolic skeletal lytic lesions have decreased in metabolic activity or similar to comparison exam (01/05/2019). No evidence of disease progression. 2. Multiple additional lytic lesions do not have metabolic activity and unchanged. 3. No new skeletal lesions are identified. No soft tissue plasmacytoma identified. 4. Nodule / node in the LEFT parapharyngeal space which is intensely hypermetabolic not changed from prior. 5. New hypermetabolic LEFT lower lobe pulmonary nodule is indeterminate. Recommend close attention on follow-up 6. New obstructive hydronephrosis of the RIGHT kidney related to RIGHT UPJ stone."  09/18/2019 BM Bx Report which revealed "Slightly hypercellular bone marrow for age with trilineage hematopoiesis and 1% plasma cells."  09/14/2019 PET/CT Whole Body Scan ([id number]) which revealed "1. There widespread tiny lytic lesions compatible with multiple myeloma. Index larger lesions are generally similar to the prior exam, with low-grade activity such as the left T9 vertebral body lesion with maximum SUV 4.5. Is mild increase in the activity associated with a mildly sclerotic left proximal humeral lesion, maximum SUV 4.8 (previously 3.5). 2. At the site of the prior left lower lobe nodule is currently more bandlike thickening, with maximum SUV only 1.9, probably benign, continued surveillance of this region  suggested. 3. There several small but hypermetabolic lymph nodes. This includes a left parapharyngeal space node measuring 1.0 cm with maximum SUV 12.3 (stable); a left level IB lymph node measuring 0.5 cm with maximum SUV 4.8 (slightly larger than prior); and a left inguinal lymph node measuring 0.7 cm in short axis with maximum SUV 6.4 (previously 0.5 cm with maximum SUV 0.6). Significance of these lymph nodes uncertain, surveillance is recommended. 4. New 5 mm left lower lobe subpleural nodule on image 32/8, not appreciably hypermetabolic, surveillance suggested. 5. Focal subcutaneous stranding along the left perineum measuring about 2.6 by 1.1 cm on image 221/4, maximum SUV 12.5. This was not present previously and is most likely inflammatory, although given the notable SUV, surveillance of this region is suggested. 6. Other imaging findings of potential clinical significance: Aortic Atherosclerosis (ICD10-I70.0). Coronary atherosclerosis. Old granulomatous disease. Mild right  hydronephrosis due to a 7 mm right UPJ calculus. 2 mm right kidney upper pole nonobstructive renal calculus. Prominent stool throughout the colon favors constipation."  12/19/2019 Thoracic & Lumbar Spine MRI ([id number]) ([id number]) revealed "Suspected myeloma lesions at T9 and S1. No compression deformity or epidural disease.  01/18/2020 PET/CT ([id number]) which revealed "1. Stable lytic lesions throughout the skeleton. The larger lytic lesions which had mild metabolic activity on comparison exam now have background metabolic activity. No evidence of active myeloma. No evidence of progression multiple myeloma.  No plasmacytoma 3. Hypermetabolic nodules in the LEFT neck may be associated deep tissues of the LEFT parotid gland. 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 Heterogeneous liver activity, as seen on 01/05/19  PET/CT Extra-medullary hematopoiesis vs metabolic liver disease vs hepatic malignancy ?  01/17/19 MRI Liver revealed Several appreciable liver lesions all have benign imaging characteristics. No MRI findings of metastatic involvement of the liver. 2. Scattered bony lesions corresponding to the lytic lesions seen at PET-CT, compatible with active myeloma. 3. Aortic Atherosclerosis.  Mild cardiomegaly. 4. Diffuse hepatic steatosis.   3. Left lower lobe pulmonary nodule First seen on 06/01/19 PET/CT PET/CT 08/27/2020: No hypermetabolic mediastinal or hilar nodes. No suspicious pulmonary nodules on the CT scan.  4. 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 Being managed conservatively as per patient's preference.  No symptoms from this currently.  5. Mild chronic kidney disease CMP done 04/09/2022 showed creatinine of 1.17 and GFR est of 48 consistent with mild chronic kidney disease.  PLAN: -Discussed lab results from today, 09/23/2023, in detail with the patient. CBC is stable. CMP is stable -Discussed the option of repeat PET scan due to worsened back pain. Patient agrees. -Discussed to continue to use heat pads and could take tylenol.  -Discussed the option of lidocaine patch for pain. Patient agrees.  -Will prescribe lidocaine patches.  -Discussed the option of muscle relaxer. Patient denies due to side effect of drowsiness.  -advised patient to stay well hydrated especially with carfilzamib -continue maintenance Carfilzomib and Revlimid for high risk myeloma -Hold Zometa for now pending dental procedure and dental clearance. -Answered all of patient's questions.   FOLLOW UP: Per integrated scheduling  The total time spent in the appointment was 30 minutes* .  All of the patient's questions were answered with apparent satisfaction. The patient knows to call the clinic with any problems, questions or  concerns.   Wyvonnia Lora MD MS AAHIVMS [facility] Hematology/Oncology Physician [facility]  .*Total Encounter Time as defined by the Centers for Medicare and Medicaid Services includes, in addition to the face-to-face time of a patient visit (documented in the note above) non-face-to-face time: obtaining and reviewing outside history, ordering and reviewing medications, tests or procedures, care coordination (communications with other health care professionals or caregivers) and documentation in the medical record.   I,Param Shah,acting as a Neurosurgeon for Wyvonnia Lora, MD.,have documented all relevant documentation on the behalf of Wyvonnia Lora, MD,as directed by  Wyvonnia Lora, MD while in the presence of Wyvonnia Lora, MD.   .I have reviewed the above documentation for accuracy and completeness, and I agree with the above. Johney Maine MD

## 2023-09-23 NOTE — Patient Instructions (Signed)
[facility]  Discharge Instructions: Thank you for choosing [facility] to provide your oncology and hematology care.   If you have a lab appointment with the Cancer Center, please go directly to the Cancer Center and check in at the registration area.   Wear comfortable clothing and clothing appropriate for easy access to any Portacath or PICC line.   We strive to give you quality time with your provider. You may need to reschedule your appointment if you arrive late (15 or more minutes).  Arriving late affects you and other patients whose appointments are after yours.  Also, if you miss three or more appointments without notifying the office, you may be dismissed from the clinic at the provider's discretion.      For prescription refill requests, have your pharmacy contact our office and allow 72 hours for refills to be completed.    Today you received the following chemotherapy and/or immunotherapy agents Carfilzomib      To help prevent nausea and vomiting after your treatment, we encourage you to take your nausea medication as directed.  BELOW ARE SYMPTOMS THAT SHOULD BE REPORTED IMMEDIATELY: *FEVER GREATER THAN 100.4 F (38 C) OR HIGHER *CHILLS OR SWEATING *NAUSEA AND VOMITING THAT IS NOT CONTROLLED WITH YOUR NAUSEA MEDICATION *UNUSUAL SHORTNESS OF BREATH *UNUSUAL BRUISING OR BLEEDING *URINARY PROBLEMS (pain or burning when urinating, or frequent urination) *BOWEL PROBLEMS (unusual diarrhea, constipation, pain near the anus) TENDERNESS IN MOUTH AND THROAT WITH OR WITHOUT PRESENCE OF ULCERS (sore throat, sores in mouth, or a toothache) UNUSUAL RASH, SWELLING OR PAIN  UNUSUAL VAGINAL DISCHARGE OR ITCHING   Items with * indicate a potential emergency and should be followed up as soon as possible or go to the Emergency Department if any problems should occur.  Please show the CHEMOTHERAPY ALERT CARD or IMMUNOTHERAPY ALERT CARD at  check-in to the Emergency Department and triage nurse.  Should you have questions after your visit or need to cancel or reschedule your appointment, please contact [facility]  Dept: [phone]  and follow the prompts.  Office hours are 8:00 a.m. to 4:30 p.m. Monday - Friday. Please note that voicemails left after 4:00 p.m. may not be returned until the following business day.  We are closed weekends and major holidays. You have access to a nurse at all times for urgent questions. Please call the main number to the clinic Dept: [phone] and follow the prompts.   For any non-urgent questions, you may also contact your provider using MyChart. We now offer e-Visits for anyone 18 and older to request care online for non-urgent symptoms. For details visit [email or web address].   Also download the MyChart app! Go to the app store, search "MyChart", open the app, select [facility], and log in with your MyChart username and password.   

## 2023-09-24 ENCOUNTER — Ambulatory Visit: Payer: Medicare PPO | Admitting: Hematology

## 2023-09-24 ENCOUNTER — Ambulatory Visit: Payer: Medicare PPO

## 2023-09-24 ENCOUNTER — Other Ambulatory Visit: Payer: Medicare PPO

## 2023-09-29 ENCOUNTER — Encounter: Payer: Self-pay | Admitting: Hematology

## 2023-09-29 MED ORDER — LIDOCAINE 5 % EX PTCH: 1.0000 | MEDICATED_PATCH | CUTANEOUS | 0 refills | Status: AC

## 2023-09-30 ENCOUNTER — Telehealth: Payer: Self-pay

## 2023-09-30 NOTE — Telephone Encounter (Signed)
Notified Patient by voicemail of prior authorization approval for Lidocaine 5% patches. Medication is approved through 12/13/2024. No other needs or concerns noted at this time.

## 2023-10-04 ENCOUNTER — Other Ambulatory Visit: Payer: Self-pay

## 2023-10-04 DIAGNOSIS — C9000 Multiple myeloma not having achieved remission: Secondary | ICD-10-CM

## 2023-10-05 ENCOUNTER — Encounter: Payer: Self-pay | Admitting: Hematology

## 2023-10-05 MED ORDER — FENTANYL 12 MCG/HR TD PT72: 1.0000 | MEDICATED_PATCH | TRANSDERMAL | 0 refills | Status: DC

## 2023-10-08 ENCOUNTER — Inpatient Hospital Stay: Payer: Medicare PPO

## 2023-10-08 VITALS — BP 129/62 | HR 63 | Temp 97.7°F | Resp 18 | Wt 150.4 lb

## 2023-10-08 DIAGNOSIS — Z5112 Encounter for antineoplastic immunotherapy: Secondary | ICD-10-CM | POA: Diagnosis not present

## 2023-10-08 DIAGNOSIS — C9000 Multiple myeloma not having achieved remission: Secondary | ICD-10-CM | POA: Diagnosis not present

## 2023-10-08 DIAGNOSIS — Z7189 Other specified counseling: Secondary | ICD-10-CM

## 2023-10-08 DIAGNOSIS — Z79899 Other long term (current) drug therapy: Secondary | ICD-10-CM | POA: Diagnosis not present

## 2023-10-08 LAB — CMP (CANCER CENTER ONLY)
ALT: 15 U/L (ref 0–44)
AST: 14 U/L — ABNORMAL LOW (ref 15–41)
Albumin: 3.8 g/dL (ref 3.5–5.0)
Alkaline Phosphatase: 98 U/L (ref 38–126)
Anion gap: 7 (ref 5–15)
BUN: 17 mg/dL (ref 8–23)
CO2: 21 mmol/L — ABNORMAL LOW (ref 22–32)
Calcium: 9.3 mg/dL (ref 8.9–10.3)
Chloride: 111 mmol/L (ref 98–111)
Creatinine: 0.9 mg/dL (ref 0.44–1.00)
GFR, Estimated: >60 mL/min (ref >60)
Glucose, Bld: 247 mg/dL — ABNORMAL HIGH (ref 70–99)
Potassium: 3.6 mmol/L (ref 3.5–5.1)
Sodium: 139 mmol/L (ref 135–145)
Total Bilirubin: 0.7 mg/dL (ref 0.3–1.2)
Total Protein: 6 g/dL — ABNORMAL LOW (ref 6.5–8.1)

## 2023-10-08 LAB — CBC WITH DIFFERENTIAL (CANCER CENTER ONLY)
Abs Immature Granulocytes: 0.01 10*3/uL (ref 0.00–0.07)
Basophils Absolute: 0 10*3/uL (ref 0.0–0.1)
Basophils Relative: 1 %
Eosinophils Absolute: 0.1 10*3/uL (ref 0.0–0.5)
Eosinophils Relative: 3 %
HCT: 37.2 % (ref 36.0–46.0)
Hemoglobin: 12.5 g/dL (ref 12.0–15.0)
Immature Granulocytes: 0 %
Lymphocytes Relative: 25 %
Lymphs Abs: 1 10*3/uL (ref 0.7–4.0)
MCH: 34.2 pg — ABNORMAL HIGH (ref 26.0–34.0)
MCHC: 33.6 g/dL (ref 30.0–36.0)
MCV: 101.9 fL — ABNORMAL HIGH (ref 80.0–100.0)
Monocytes Absolute: 0.7 10*3/uL (ref 0.1–1.0)
Monocytes Relative: 19 %
Neutro Abs: 2 10*3/uL (ref 1.7–7.7)
Neutrophils Relative %: 52 %
Platelet Count: 144 10*3/uL — ABNORMAL LOW (ref 150–400)
RBC: 3.65 MIL/uL — ABNORMAL LOW (ref 3.87–5.11)
RDW: 14.1 % (ref 11.5–15.5)
WBC Count: 3.8 10*3/uL — ABNORMAL LOW (ref 4.0–10.5)
nRBC: 0 % (ref 0.0–0.2)

## 2023-10-08 MED ORDER — SODIUM CHLORIDE 0.9 % IV SOLN: Freq: Once | INTRAVENOUS | Status: AC

## 2023-10-08 MED ORDER — DEXAMETHASONE 4 MG PO TABS
8.0000 mg | ORAL_TABLET | Freq: Once | ORAL | Status: AC
  Administered 2023-10-08: 8 mg via ORAL
  Filled 2023-10-08: qty 2

## 2023-10-08 MED ORDER — SODIUM CHLORIDE 0.9% FLUSH
10.0000 mL | INTRAVENOUS | Status: DC | PRN
  Administered 2023-10-08: 10 mL

## 2023-10-08 MED ORDER — PROCHLORPERAZINE MALEATE 10 MG PO TABS
10.0000 mg | ORAL_TABLET | Freq: Once | ORAL | Status: AC
  Administered 2023-10-08: 10 mg via ORAL
  Filled 2023-10-08: qty 1

## 2023-10-08 MED ORDER — DIPHENHYDRAMINE HCL 25 MG PO CAPS
25.0000 mg | ORAL_CAPSULE | Freq: Once | ORAL | Status: AC
Start: 2023-10-08 — End: 2023-10-08
  Administered 2023-10-08: 25 mg via ORAL
  Filled 2023-10-08: qty 1

## 2023-10-08 MED ORDER — CARFILZOMIB CHEMO INJECTION 60 MG
36.0000 mg/m2 | Freq: Once | INTRAVENOUS | Status: AC
  Administered 2023-10-08: 60 mg via INTRAVENOUS
  Filled 2023-10-08: qty 30

## 2023-10-08 MED ORDER — HEPARIN SOD (PORK) LOCK FLUSH 100 UNIT/ML IV SOLN
500.0000 [IU] | Freq: Once | INTRAVENOUS | Status: AC | PRN
  Administered 2023-10-08: 500 [IU]

## 2023-10-08 MED ORDER — FAMOTIDINE IN NACL 20-0.9 MG/50ML-% IV SOLN
20.0000 mg | Freq: Once | INTRAVENOUS | Status: AC
  Administered 2023-10-08: 20 mg via INTRAVENOUS
  Filled 2023-10-08: qty 50

## 2023-10-08 MED ORDER — ACETAMINOPHEN 325 MG PO TABS
650.0000 mg | ORAL_TABLET | Freq: Once | ORAL | Status: AC
  Administered 2023-10-08: 650 mg via ORAL
  Filled 2023-10-08: qty 2

## 2023-10-08 NOTE — Patient Instructions (Signed)
[facility]  Discharge Instructions: Thank you for choosing [facility] to provide your oncology and hematology care.   If you have a lab appointment with the Cancer Center, please go directly to the Cancer Center and check in at the registration area.   Wear comfortable clothing and clothing appropriate for easy access to any Portacath or PICC line.   We strive to give you quality time with your provider. You may need to reschedule your appointment if you arrive late (15 or more minutes).  Arriving late affects you and other patients whose appointments are after yours.  Also, if you miss three or more appointments without notifying the office, you may be dismissed from the clinic at the provider's discretion.      For prescription refill requests, have your pharmacy contact our office and allow 72 hours for refills to be completed.    Today you received the following chemotherapy and/or immunotherapy agents :  Kyprolis   To help prevent nausea and vomiting after your treatment, we encourage you to take your nausea medication as directed.  BELOW ARE SYMPTOMS THAT SHOULD BE REPORTED IMMEDIATELY: *FEVER GREATER THAN 100.4 F (38 C) OR HIGHER *CHILLS OR SWEATING *NAUSEA AND VOMITING THAT IS NOT CONTROLLED WITH YOUR NAUSEA MEDICATION *UNUSUAL SHORTNESS OF BREATH *UNUSUAL BRUISING OR BLEEDING *URINARY PROBLEMS (pain or burning when urinating, or frequent urination) *BOWEL PROBLEMS (unusual diarrhea, constipation, pain near the anus) TENDERNESS IN MOUTH AND THROAT WITH OR WITHOUT PRESENCE OF ULCERS (sore throat, sores in mouth, or a toothache) UNUSUAL RASH, SWELLING OR PAIN  UNUSUAL VAGINAL DISCHARGE OR ITCHING   Items with * indicate a potential emergency and should be followed up as soon as possible or go to the Emergency Department if any problems should occur.  Please show the CHEMOTHERAPY ALERT CARD or IMMUNOTHERAPY ALERT CARD at  check-in to the Emergency Department and triage nurse.  Should you have questions after your visit or need to cancel or reschedule your appointment, please contact [facility]  Dept: [phone]  and follow the prompts.  Office hours are 8:00 a.m. to 4:30 p.m. Monday - Friday. Please note that voicemails left after 4:00 p.m. may not be returned until the following business day.  We are closed weekends and major holidays. You have access to a nurse at all times for urgent questions. Please call the main number to the clinic Dept: [phone] and follow the prompts.   For any non-urgent questions, you may also contact your provider using MyChart. We now offer e-Visits for anyone 18 and older to request care online for non-urgent symptoms. For details visit [email or web address].   Also download the MyChart app! Go to the app store, search "MyChart", open the app, select [facility], and log in with your MyChart username and password.   

## 2023-10-08 NOTE — Progress Notes (Signed)
Per Candise Che MD, hold Zometa today due to dental issues and pending results of PET scan.

## 2023-10-09 ENCOUNTER — Other Ambulatory Visit: Payer: Self-pay | Admitting: Hematology

## 2023-10-09 DIAGNOSIS — C9000 Multiple myeloma not having achieved remission: Secondary | ICD-10-CM

## 2023-10-11 DIAGNOSIS — L719 Rosacea, unspecified: Secondary | ICD-10-CM | POA: Diagnosis not present

## 2023-10-14 ENCOUNTER — Encounter (HOSPITAL_COMMUNITY)
Admission: RE | Admit: 2023-10-14 | Discharge: 2023-10-14 | Disposition: A | Discharge status: Home / Self Care | Payer: Medicare PPO | Source: Ambulatory Visit | Attending: Hematology | Admitting: Hematology

## 2023-10-14 DIAGNOSIS — C9000 Multiple myeloma not having achieved remission: Secondary | ICD-10-CM | POA: Diagnosis not present

## 2023-10-14 LAB — GLUCOSE, CAPILLARY: Glucose-Capillary: 140 mg/dL — ABNORMAL HIGH (ref 70–99)

## 2023-10-14 MED ORDER — FLUDEOXYGLUCOSE F - 18 (FDG) INJECTION
7.5000 | Freq: Once | INTRAVENOUS | Status: AC
  Administered 2023-10-14: 7.5 via INTRAVENOUS

## 2023-10-15 ENCOUNTER — Encounter: Payer: Self-pay | Admitting: Hematology

## 2023-10-15 ENCOUNTER — Other Ambulatory Visit: Payer: Self-pay

## 2023-10-21 NOTE — Progress Notes (Signed)
HEMATOLOGY/ONCOLOGY CLINIC NOTE  Date of Service: 10/22/2023   Patient Care Team: Myrlene Broker, MD as PCP - General (Internal Medicine) Hart Carwin, MD (Inactive) as Consulting Physician (Gastroenterology) Jerene Bears, MD as Consulting Physician (Gynecology) Marcene Corning, MD as Consulting Physician (Orthopedic Surgery) Waymon Budge, MD as Consulting Physician (Pulmonary Disease) Mateo Flow, MD (Ophthalmology)  CHIEF COMPLAINTS/PURPOSE OF VISIT:  Follow-up for continued evaluation and management of multiple myeloma  HISTORY OF PRESENTING ILLNESS:  Please see previous notes for details on initial presentation  Current Treatment: Carfilzomib + Revlimid maintenance.  INTERVAL HISTORY:   Faith Orr is a 82 y.o. female who is here for continued evaluation and management of the patient's multiple myeloma. She is here to start cycle 9 day 15 of her treatment.   Patient was last seen by me on 09/23/2023 and complained of worsened right lower back pain with movement and occasional constipation/diarrhea.   Today, she reports that her fatigue has been manageable and she is not as drowsy during the day.  Patient denies any infection issues. Patient reports that she did receive the old shingles vaccine, but is due to receive the new shingles vaccine. She reports that she received the new covid-19 and flu shot 3 weeks ago. Patient is unsure if she received the RSV vaccine.   Patient reports that her back pain has improved overall, but still as issues bending over due to back soreness. She continues to use a heating pad which does improve symptoms. Patient denies any recent falls. She notes that she moves around carefully.   Patient denies any abdominal pain or leg swelling. She has no other new concerns.  MEDICAL HISTORY:  Past Medical History:  Diagnosis Date   Allergy    seasonal   Asthma    DEPRESSION    DIABETES MELLITUS, TYPE II     Diverticulosis    HYPERLIPIDEMIA    Macular degeneration of left eye    mild, Dr.Hecker   Obesity, unspecified    Osteoarthritis of both knees    OSTEOPENIA    Osteopenia    URINARY INCONTINENCE     SURGICAL HISTORY: Past Surgical History:  Procedure Laterality Date   CATARACT EXTRACTION Left 05/24/2018   CESAREAN SECTION  01/1973   CYSTOSCOPY/URETEROSCOPY/HOLMIUM LASER/STENT PLACEMENT Right 09/20/2019   Procedure: CYSTOSCOPY/URETEROSCOPY/HOLMIUM LASER/STENT PLACEMENT;  Surgeon: [name], MD;  Location[facility];  Service: Urology;  Laterality: Right;   FRACTURE SURGERY     IR BONE MARROW BIOPSY & ASPIRATION  02/16/2023   IR IMAGING GUIDED PORT INSERTION  02/20/2019   left wrist surgery  2008   By Dr. Yisroel Ramming   right ankle  1994    SOCIAL HISTORY: Social History   Socioeconomic History   Marital status: Married    Spouse name: Not on file   Number of children: 1   Years of education: Not on file   Highest education level: Not on file  Occupational History    Employer: [employer]  Tobacco Use   Smoking status: Never   Smokeless tobacco: Never   Tobacco comments:    Lives with partner [name]) and son  Vaping Use   Vaping status: Never Used  Substance and Sexual Activity   Alcohol use: No    Alcohol/week: 0.0 standard drinks of alcohol   Drug use: No   Sexual activity: Never    Partners: Female    Birth control/protection: Post-menopausal  Comment: Lives with female partner (annette hicks) and 59 yo son  Other Topics Concern   Not on file  Social History Narrative   Not on file   Social Determinants of Health   Financial Resource Strain: Low Risk  (09/09/2023)   Overall Financial Resource Strain (CARDIA)    Difficulty of Paying Living Expenses: Not hard at all  Food Insecurity: No Food Insecurity (09/09/2023)   Hunger Vital Sign    Worried About Running Out of Food in the Last Year: Never true    Ran Out of Food  in the Last Year: Never true  Transportation Needs: No Transportation Needs (09/09/2023)   PRAPARE - Administrator, Civil Service (Medical): No    Lack of Transportation (Non-Medical): No  Physical Activity: Inactive (09/09/2023)   Exercise Vital Sign    Days of Exercise per Week: 0 days    Minutes of Exercise per Session: 0 min  Stress: No Stress Concern Present (09/09/2023)   [employer] of Occupational Health - Occupational Stress Questionnaire    Feeling of Stress : Not at all  Social Connections: Socially Integrated (09/09/2023)   Social Connection and Isolation Panel [NHANES]    Frequency of Communication with Friends and Family: More than three times a week    Frequency of Social Gatherings with Friends and Family: More than three times a week    Attends Religious Services: More than 4 times per year    Active Member of [employer] or Organizations: Yes    Attends Engineer, structural: More than 4 times per year    Marital Status: Married  Catering manager Violence: Not At Risk (05/08/2021)   Humiliation, Afraid, Rape, and Kick questionnaire    Fear of Current or Ex-Partner: No    Emotionally Abused: No    Physically Abused: No    Sexually Abused: No    FAMILY HISTORY: Family History  Problem Relation Age of Onset   Diabetes Father    Hyperlipidemia Father    Heart disease Father    Cancer Father    Hypertension Father    Colon cancer Paternal Grandmother 38   Osteoporosis Mother    Protein S deficiency Mother    Hyperlipidemia Mother    Multiple sclerosis Daughter    Cancer Other        bladder   Breast cancer Neg Hx     ALLERGIES:  is allergic to levofloxacin, penicillins, aleve [naproxen sodium], and sulfonamide derivatives.  MEDICATIONS:  Current Outpatient Medications  Medication Sig Dispense Refill   Acetaminophen (TYLENOL PO) Take by mouth as needed.     acyclovir (ZOVIRAX) 400 MG tablet TAKE 1 TABLET(400 MG) BY MOUTH TWICE DAILY 60  tablet 5   Blood Glucose Monitoring Suppl (FREESTYLE FREEDOM LITE) W/DEVICE KIT Use to check blood sugars twice a day Dx 250.00 1 each 0   Calcium Carbonate-Vitamin D 600-400 MG-UNIT tablet Take 1 tablet by mouth 2 (two) times daily.     dexamethasone (DECADRON) 4 MG tablet Take 5 tablets (20 mg total) by mouth once a week. On D22 of each cycle of treatment 20 tablet 5   ELIQUIS 2.5 MG TABS tablet TAKE 1 TABLET(2.5 MG) BY MOUTH TWICE DAILY 60 tablet 2   fentaNYL (DURAGESIC) 12 MCG/HR Place 1 patch onto the skin every 3 (three) days. 10 patch 0   fluticasone (FLONASE) 50 MCG/ACT nasal spray Place 1 spray into both nostrils daily. 48 g 3   glucose blood (FREESTYLE LITE)  test strip CHECK BLOOD SUGAR TWICE DAILY AS DIRECTED Dx 250.00 180 each 3   Lancets (FREESTYLE) lancets Use twice daily to check sugars. 100 each 11   lenalidomide (REVLIMID) 5 MG capsule TAKE 1 CAPSULE BY MOUTH DAILY  FOR 21 DAYS, THEN 7 DAYS OFF 21 capsule 0   lidocaine (LIDODERM) 5 % Place 1 patch onto the skin daily. Remove & Discard patch within 12 hours or as directed by MD 30 patch 0   lidocaine-prilocaine (EMLA) cream APPLY 1 APPLICATION TO THE AFFECTED AREA AS NEEDED. USE PRIOR TO PORT ACCESS 30 g 0   meclizine (ANTIVERT) 25 MG tablet Take 1 tablet (25 mg total) by mouth every 8 (eight) hours as needed for dizziness. 20 tablet 0   metFORMIN (GLUCOPHAGE-XR) 500 MG 24 hr tablet Take 1 tablet (500 mg total) by mouth 2 (two) times daily with a meal. 180 tablet 3   metroNIDAZOLE (METROGEL) 1 % gel Apply topically in the morning and at bedtime. 60 g 1   Multiple Vitamins-Minerals (ICAPS) CAPS Take 1 capsule by mouth daily after breakfast.     ondansetron (ZOFRAN) 8 MG tablet Take 1 tablet (8 mg total) by mouth 2 (two) times daily as needed (Nausea or vomiting). 30 tablet 1   Oxycodone HCl 10 MG TABS Take 1 tablet (10 mg total) by mouth every 6 (six) hours as needed. 90 tablet 0   pantoprazole (PROTONIX) 20 MG tablet TAKE 1  TABLET(20 MG) BY MOUTH DAILY 30 tablet 2   polyethylene glycol (MIRALAX / GLYCOLAX) packet Take 17 g by mouth daily after breakfast.     potassium chloride SA (KLOR-CON M) 20 MEQ tablet Take 2 tablets (40 mEq total) by mouth 2 (two) times daily. 360 tablet 1   prochlorperazine (COMPAZINE) 10 MG tablet Take 1 tablet (10 mg total) by mouth every 6 (six) hours as needed (Nausea or vomiting). 30 tablet 1   senna-docusate (SENNA S) 8.6-50 MG tablet Take 2 tablets by mouth at bedtime. 60 tablet 2   sertraline (ZOLOFT) 100 MG tablet Take 1 tablet (100 mg total) by mouth daily. 90 tablet 3   simvastatin (ZOCOR) 20 MG tablet Take 1 tablet (20 mg total) by mouth daily at 6 PM. 90 tablet 3   Vitamin D, Ergocalciferol, (DRISDOL) 1.25 MG (50000 UNIT) CAPS capsule TAKE 1 CAPSULE BY MOUTH EVERY 7 DAYS 12 capsule 0   No current facility-administered medications for this visit.   Facility-Administered Medications Ordered in Other Visits  Medication Dose Route Frequency Provider Last Rate Last Admin   [MAR Hold] heparin lock flush 100 unit/mL  500 Units Intracatheter Once PRN Johney Maine, MD        REVIEW OF SYSTEMS:    10 Point review of Systems was done is negative except as noted above.   PHYSICAL EXAMINATION: ECOG FS:2 - Symptomatic, <50% confined to bed .BP (!) 119/55 (BP Location: Right Arm)   Pulse 61   Temp 98.1 F (36.7 C) (Oral)   Resp 16   Wt 150 lb (68 kg)   LMP 10/09/2012   SpO2 99%   BMI 30.30 kg/m    Wt Readings from Last 3 Encounters:  10/22/23 150 lb (68 kg)  10/08/23 150 lb 6.4 oz (68.2 kg)  09/23/23 150 lb 3.2 oz (68.1 kg)   Body mass index is 30.3 kg/m.     GENERAL:alert, in no acute distress and comfortable SKIN: no acute rashes, no significant lesions EYES: conjunctiva are pink and non-injected,  sclera anicteric OROPHARYNX: MMM, no exudates, no oropharyngeal erythema or ulceration NECK: supple, no JVD LYMPH:  no palpable lymphadenopathy in the cervical,  axillary or inguinal regions LUNGS: clear to auscultation b/l with normal respiratory effort HEART: regular rate & rhythm ABDOMEN:  normoactive bowel sounds , non tender, not distended. Extremity: no pedal edema PSYCH: alert & oriented x 3 with fluent speech NEURO: no focal motor/sensory deficits   LABS     Latest Ref Rng & Units 10/22/2023    9:05 AM 10/08/2023    1:14 PM 09/23/2023    9:52 AM  CBC  WBC 4.0 - 10.5 K/uL 4.4  3.8  4.1   Hemoglobin 12.0 - 15.0 g/dL 95.6  21.3  08.6   Hematocrit 36.0 - 46.0 % 37.6  37.2  36.3   Platelets 150 - 400 K/uL 174  144  186       Latest Ref Rng & Units 10/22/2023    9:05 AM 10/08/2023    1:14 PM 09/23/2023    9:52 AM  CMP  Glucose 70 - 99 mg/dL 578  469  629   BUN 8 - 23 mg/dL 17  17  19    Creatinine 0.44 - 1.00 mg/dL 5.28  4.13  2.44   Sodium 135 - 145 mmol/L 140  139  139   Potassium 3.5 - 5.1 mmol/L 3.9  3.6  4.0   Chloride 98 - 111 mmol/L 108  111  110   CO2 22 - 32 mmol/L 25  21  23    Calcium 8.9 - 10.3 mg/dL 9.8  9.3  9.6   Total Protein 6.5 - 8.1 g/dL 6.0  6.0  5.9   Total Bilirubin <1.2 mg/dL 0.7  0.7  0.6   Alkaline Phos 38 - 126 U/L 91  98  83   AST 15 - 41 U/L 12  14  11    ALT 0 - 44 U/L 12  15  12       09/18/2019 BM Bx Report ([id number])   09/18/2019 FISH Panel    05/30/2019 BM Bx   01/06/2019 BM Bx:     01/06/19 Cytogenetics:      05/30/19 BM Biopsy:   09/18/2019 FISH Panel    09/18/2019 BM Surgical Pathology ([id number])     RADIOGRAPHIC STUDIES: I have personally reviewed the radiological images as listed and agreed with the findings in the report. No results found.   ASSESSMENT & PLAN:   82 y.o. female with  1.  Nonsecretory multiple Myeloma, RISS Stage III  Labs upon initial presentation from 12/08/18, blood counts are normal including WBC at 7.1k, HGB at 13.1, and PLT at 245k. Calcium normal at 10.3. Creatinine normal at 0.63. M spike at 0.5g. 12/13/18 Bone Scan revealed  Multifocal uptake throughout the skeleton, consistent with diffuse metastatic disease. Primary tumor is not specified. 2. Uptake in the proximal right femur, consistent with lytic lesions. 3. Uptake in the ribs bilaterally as described. 4. Lesions in the proximal left humerus. 5. Diffuse uptake throughout the skull consistent with metastatic disease. 6. Right paramedian uptake at the manubrium.  12/13/18 CT Right Femur revealed Numerous lytic lesions involving the right femur and a lytic lesion in the left inferior pubic ramus. Overall appearance is most concerning for multiple myeloma  12/27/18 Pretreatment 24hour UPEP observed an M spike at 18mg , and showed 199mg  total protein/day.  12/27/18 Pretreatment MMP revealed M Protein at 0.5g with IgG Lambda specificity. Kappa:Lambda light chain ratio at 0.13, with Lambda  at 40.3. There is less abnormal protein and light chains than I would expect from 30% plasma cells, which suggests hypo-secretory or non-secretory neoplastic plasma cells. Will have an impact in assessing response. 01/05/19 PET/CT revealed Innumerable lytic lesions in the skeleton compatible with myeloma. Most of the larger lesions are hypermetabolic, for example including a left proximal humeral shaft lesion with maximum SUV of 8.1 and a 2.8 cm lesion in the left T9 vertebral body with maximum SUV 5.1. Most of the smaller lytic lesions, and some of the larger lesions, do not demonstrate accentuated metabolic activity. 2. 1.2 cm in short axis lymph node in the left parapharyngeal space is hypermetabolic with maximum SUV 11.8. I do not see a separate mass in the head and neck to give rise to this hypermetabolic lymph node. 3. Mosaic attenuation in the lower lobes, nonspecific possibly from air trapping. 4.  Aortic Atherosclerosis 5. Heterogeneous activity in the liver, making it hard to exclude small liver lesions. Consider hepatic protocol MRI with and without contrast for definitive assessment.  Nonobstructive right nephrolithiasis. Old granulomatous disease  01/06/19 Bone Marrow biopsy revealed interstitial increase in plasma cells (28% aspirate, 40% CD138 immunohistochemistry). Plasma cells negative for light chains consistent with a non or weakly secretory myeloma   01/06/19 Cytogenetics revealed 37% of cells with trisomy 11 or 11q deletion, and 40.5% of cells with 17p mutation  S/p 5 cycles of KRD treatment  05/31/19 BM Biopsy revealed mild atypical plasmacytosis at 5% with polytypic variation.   06/01/19 PET/CT revealed "Dominant lesion in the LEFT humerus is decreased significantly in metabolic activity. Additional hypermetabolic skeletal lytic lesions have decreased in metabolic activity or similar to comparison exam (01/05/2019). No evidence of disease progression. 2. Multiple additional lytic lesions do not have metabolic activity and unchanged. 3. No new skeletal lesions are identified. No soft tissue plasmacytoma identified. 4. Nodule / node in the LEFT parapharyngeal space which is intensely hypermetabolic not changed from prior. 5. New hypermetabolic LEFT lower lobe pulmonary nodule is indeterminate. Recommend close attention on follow-up 6. New obstructive hydronephrosis of the RIGHT kidney related to RIGHT UPJ stone."  09/18/2019 BM Bx Report which revealed "Slightly hypercellular bone marrow for age with trilineage hematopoiesis and 1% plasma cells."  09/14/2019 PET/CT Whole Body Scan ([id number]) which revealed "1. There widespread tiny lytic lesions compatible with multiple myeloma. Index larger lesions are generally similar to the prior exam, with low-grade activity such as the left T9 vertebral body lesion with maximum SUV 4.5. Is mild increase in the activity associated with a mildly sclerotic left proximal humeral lesion, maximum SUV 4.8 (previously 3.5). 2. At the site of the prior left lower lobe nodule is currently more bandlike thickening, with maximum SUV only 1.9,  probably benign, continued surveillance of this region suggested. 3. There several small but hypermetabolic lymph nodes. This includes a left parapharyngeal space node measuring 1.0 cm with maximum SUV 12.3 (stable); a left level IB lymph node measuring 0.5 cm with maximum SUV 4.8 (slightly larger than prior); and a left inguinal lymph node measuring 0.7 cm in short axis with maximum SUV 6.4 (previously 0.5 cm with maximum SUV 0.6). Significance of these lymph nodes uncertain, surveillance is recommended. 4. New 5 mm left lower lobe subpleural nodule on image 32/8, not appreciably hypermetabolic, surveillance suggested. 5. Focal subcutaneous stranding along the left perineum measuring about 2.6 by 1.1 cm on image 221/4, maximum SUV 12.5. This was not present previously and is most likely inflammatory, although given the  notable SUV, surveillance of this region is suggested. 6. Other imaging findings of potential clinical significance: Aortic Atherosclerosis (ICD10-I70.0). Coronary atherosclerosis. Old granulomatous disease. Mild right hydronephrosis due to a 7 mm right UPJ calculus. 2 mm right kidney upper pole nonobstructive renal calculus. Prominent stool throughout the colon favors constipation."  12/19/2019 Thoracic & Lumbar Spine MRI ([id number]) ([id number]) revealed "Suspected myeloma lesions at T9 and S1. No compression deformity or epidural disease.  01/18/2020 PET/CT ([id number]) which revealed "1. Stable lytic lesions throughout the skeleton. The larger lytic lesions which had mild metabolic activity on comparison exam now have background metabolic activity. No evidence of active myeloma. No evidence of progression multiple myeloma.  No plasmacytoma 3. Hypermetabolic nodules in the LEFT neck may be associated deep tissues of the LEFT parotid gland. 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  01/17/19 MRI Liver revealed Several appreciable liver lesions all have benign imaging characteristics. No MRI findings of metastatic involvement of the liver. 2. Scattered bony lesions corresponding to the lytic lesions seen at PET-CT, compatible with active myeloma. 3. Aortic Atherosclerosis.  Mild cardiomegaly. 4. Diffuse hepatic steatosis.   3. Left lower lobe pulmonary nodule First seen on 06/01/19 PET/CT PET/CT 08/27/2020: No hypermetabolic mediastinal or hilar nodes. No suspicious pulmonary nodules on the CT scan.  4. 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 Being managed conservatively as per patient's preference.  No symptoms from this currently.  5. Mild chronic kidney disease CMP done 04/09/2022 showed creatinine of 1.17 and GFR est of 48 consistent with mild chronic kidney disease.  PLAN:  -Discussed lab results on 10/22/2023 in detail with patient. CBC normal, showed WBC of 4.4K, hemoglobin of 12.5, and platelets of 174K. -CMP stable at this time -her PET scan from 10/14/2023 has not yet been officially read. Will await official PET scan reading.  -discussed that she appears to continue to have nodules in her neck which have been unchanged for a while. Discussed that there did appear to be some reactive uptake in the spine previously as well, which is not an obvious finding.  -discussed that if there are no other concerns on her PET scan, it would be reasonable to try acupuncture or massage therapies for back pain. Advised her to avoid any major adjustments.  -recommend to use an SI brace if needed -discussed that if her back pain is persistent despite interventions such as therapies, staying active, or massages, there would be the option of injecting steroid  injections into the right SI joint for chronic SI inflammation.  -Avoid any unusual twisting/bending/activities involving the back.  -continue maintenance Carfilzomib and Revlimid for high risk myeloma  -continue acyclovir for protocol. Discussed that this would also reduce the risk of shingles.  -recommend patient to stay UTD with her age-appropriate vaccines, including shingles and RSV. She reported that she did recently receive the COVID-19 and flu shot.  -answered all of patient's questions in detail  FOLLOW UP: Per integrated scheduling  The total time spent in the appointment was 30 minutes* .  All of the patient's questions were answered with apparent satisfaction. The patient knows to call the clinic with any problems, questions or concerns.   Wyvonnia Lora MD MS AAHIVMS [facility]  [facility] Hematology/Oncology Physician [facility]  .*Total Encounter Time as defined by the Centers for Medicare and Medicaid Services includes, in addition to the face-to-face time of a patient visit (documented in the note above) non-face-to-face time: obtaining and reviewing outside history, ordering and reviewing medications, tests or procedures, care coordination (communications with other health care professionals or caregivers) and documentation in the medical record.    I,Mitra Faeizi,acting as a Neurosurgeon for Wyvonnia Lora, MD.,have documented all relevant documentation on the behalf of Wyvonnia Lora, MD,as directed by  Wyvonnia Lora, MD while in the presence of Wyvonnia Lora, MD.  .I have reviewed the above documentation for accuracy and completeness, and I agree with the above. Johney Maine MD   PET/CT 10/14/2023  IMPRESSION: 1. No evidence of active multiple myeloma. No suspicious lytic lesion on CT portion exam. 2. No change in intensely hypermetabolic nodule/node in the left parapharyngeal space adjacent to the fossa of Rosenmuller. This nodule may be within the most medial aspect of  the parotid gland. 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 3. No change in intensely hypermetabolic LEFT level 2 lymph node. 4. No change in mild radiotracer activity associated with healed rib fractures on the RIGHT. 5. No new or progressive disease.

## 2023-10-22 ENCOUNTER — Inpatient Hospital Stay: Payer: Medicare PPO | Attending: Hematology

## 2023-10-22 ENCOUNTER — Inpatient Hospital Stay: Payer: Medicare PPO | Admitting: Hematology

## 2023-10-22 ENCOUNTER — Inpatient Hospital Stay: Payer: Medicare PPO

## 2023-10-22 ENCOUNTER — Other Ambulatory Visit: Payer: Self-pay

## 2023-10-22 VITALS — BP 112/61 | HR 58 | Temp 97.8°F | Resp 16

## 2023-10-22 VITALS — BP 119/55 | HR 61 | Temp 98.1°F | Resp 16 | Wt 150.0 lb

## 2023-10-22 DIAGNOSIS — C9000 Multiple myeloma not having achieved remission: Secondary | ICD-10-CM

## 2023-10-22 DIAGNOSIS — Z7189 Other specified counseling: Secondary | ICD-10-CM

## 2023-10-22 DIAGNOSIS — Z79899 Other long term (current) drug therapy: Secondary | ICD-10-CM | POA: Insufficient documentation

## 2023-10-22 DIAGNOSIS — Z5112 Encounter for antineoplastic immunotherapy: Secondary | ICD-10-CM | POA: Diagnosis not present

## 2023-10-22 LAB — CMP (CANCER CENTER ONLY)
ALT: 12 U/L (ref 0–44)
AST: 12 U/L — ABNORMAL LOW (ref 15–41)
Albumin: 3.7 g/dL (ref 3.5–5.0)
Alkaline Phosphatase: 91 U/L (ref 38–126)
Anion gap: 7 (ref 5–15)
BUN: 17 mg/dL (ref 8–23)
CO2: 25 mmol/L (ref 22–32)
Calcium: 9.8 mg/dL (ref 8.9–10.3)
Chloride: 108 mmol/L (ref 98–111)
Creatinine: 0.96 mg/dL (ref 0.44–1.00)
GFR, Estimated: 60 mL/min — ABNORMAL LOW (ref >60)
Glucose, Bld: 124 mg/dL — ABNORMAL HIGH (ref 70–99)
Potassium: 3.9 mmol/L (ref 3.5–5.1)
Sodium: 140 mmol/L (ref 135–145)
Total Bilirubin: 0.7 mg/dL (ref <1.2)
Total Protein: 6 g/dL — ABNORMAL LOW (ref 6.5–8.1)

## 2023-10-22 LAB — CBC WITH DIFFERENTIAL (CANCER CENTER ONLY)
Abs Immature Granulocytes: 0.01 10*3/uL (ref 0.00–0.07)
Basophils Absolute: 0 10*3/uL (ref 0.0–0.1)
Basophils Relative: 1 %
Eosinophils Absolute: 0.1 10*3/uL (ref 0.0–0.5)
Eosinophils Relative: 1 %
HCT: 37.6 % (ref 36.0–46.0)
Hemoglobin: 12.5 g/dL (ref 12.0–15.0)
Immature Granulocytes: 0 %
Lymphocytes Relative: 28 %
Lymphs Abs: 1.3 10*3/uL (ref 0.7–4.0)
MCH: 34.3 pg — ABNORMAL HIGH (ref 26.0–34.0)
MCHC: 33.2 g/dL (ref 30.0–36.0)
MCV: 103.3 fL — ABNORMAL HIGH (ref 80.0–100.0)
Monocytes Absolute: 1.1 10*3/uL — ABNORMAL HIGH (ref 0.1–1.0)
Monocytes Relative: 24 %
Neutro Abs: 2 10*3/uL (ref 1.7–7.7)
Neutrophils Relative %: 46 %
Platelet Count: 174 10*3/uL (ref 150–400)
RBC: 3.64 MIL/uL — ABNORMAL LOW (ref 3.87–5.11)
RDW: 13.5 % (ref 11.5–15.5)
WBC Count: 4.4 10*3/uL (ref 4.0–10.5)
nRBC: 0 % (ref 0.0–0.2)

## 2023-10-22 MED ORDER — SODIUM CHLORIDE 0.9 % IV SOLN: Freq: Once | INTRAVENOUS | Status: AC

## 2023-10-22 MED ORDER — DEXTROSE 5 % IV SOLN
36.0000 mg/m2 | Freq: Once | INTRAVENOUS | Status: AC
  Administered 2023-10-22: 60 mg via INTRAVENOUS
  Filled 2023-10-22: qty 30

## 2023-10-22 MED ORDER — CARFILZOMIB CHEMO INJECTION 60 MG
36.0000 mg/m2 | Freq: Once | INTRAVENOUS | Status: DC
  Filled 2023-10-22: qty 30

## 2023-10-22 MED ORDER — DEXAMETHASONE 4 MG PO TABS
8.0000 mg | ORAL_TABLET | Freq: Once | ORAL | Status: AC
  Administered 2023-10-22: 8 mg via ORAL
  Filled 2023-10-22: qty 2

## 2023-10-22 MED ORDER — HEPARIN SOD (PORK) LOCK FLUSH 100 UNIT/ML IV SOLN
500.0000 [IU] | Freq: Once | INTRAVENOUS | Status: AC | PRN
  Administered 2023-10-22: 500 [IU]

## 2023-10-22 MED ORDER — DIPHENHYDRAMINE HCL 25 MG PO CAPS
25.0000 mg | ORAL_CAPSULE | Freq: Once | ORAL | Status: AC
  Administered 2023-10-22: 25 mg via ORAL
  Filled 2023-10-22: qty 1

## 2023-10-22 MED ORDER — SODIUM CHLORIDE 0.9% FLUSH
10.0000 mL | INTRAVENOUS | Status: DC | PRN
Start: 2023-10-22 — End: 2023-10-22
  Administered 2023-10-22: 10 mL

## 2023-10-22 MED ORDER — FAMOTIDINE IN NACL 20-0.9 MG/50ML-% IV SOLN
20.0000 mg | Freq: Once | INTRAVENOUS | Status: AC
  Administered 2023-10-22: 20 mg via INTRAVENOUS
  Filled 2023-10-22: qty 50

## 2023-10-22 MED ORDER — PROCHLORPERAZINE MALEATE 10 MG PO TABS
10.0000 mg | ORAL_TABLET | Freq: Once | ORAL | Status: AC
  Administered 2023-10-22: 10 mg via ORAL
  Filled 2023-10-22: qty 1

## 2023-10-22 MED ORDER — ACETAMINOPHEN 325 MG PO TABS
650.0000 mg | ORAL_TABLET | Freq: Once | ORAL | Status: AC
  Administered 2023-10-22: 650 mg via ORAL
  Filled 2023-10-22: qty 2

## 2023-10-22 NOTE — Progress Notes (Signed)
Patient seen by Dr. Kale  Vitals are within treatment parameters.  Labs reviewed: and are within treatment parameters.  Per physician team, patient is ready for treatment and there are NO modifications to the treatment plan.  

## 2023-10-22 NOTE — Patient Instructions (Signed)
 [facility] - A DEPT OF [facility]  Discharge Instructions: Thank you for choosing [facility] to provide your oncology and hematology care.   If you have a lab appointment with the Cancer Center, please go directly to the Cancer Center and check in at the registration area.   Wear comfortable clothing and clothing appropriate for easy access to any Portacath or PICC line.   We strive to give you quality time with your provider. You may need to reschedule your appointment if you arrive late (15 or more minutes).  Arriving late affects you and other patients whose appointments are after yours.  Also, if you miss three or more appointments without notifying the office, you may be dismissed from the clinic at the provider's discretion.      For prescription refill requests, have your pharmacy contact our office and allow 72 hours for refills to be completed.    Today you received the following chemotherapy and/or immunotherapy agents: Kyprolis      To help prevent nausea and vomiting after your treatment, we encourage you to take your nausea medication as directed.  BELOW ARE SYMPTOMS THAT SHOULD BE REPORTED IMMEDIATELY: *FEVER GREATER THAN 100.4 F (38 C) OR HIGHER *CHILLS OR SWEATING *NAUSEA AND VOMITING THAT IS NOT CONTROLLED WITH YOUR NAUSEA MEDICATION *UNUSUAL SHORTNESS OF BREATH *UNUSUAL BRUISING OR BLEEDING *URINARY PROBLEMS (pain or burning when urinating, or frequent urination) *BOWEL PROBLEMS (unusual diarrhea, constipation, pain near the anus) TENDERNESS IN MOUTH AND THROAT WITH OR WITHOUT PRESENCE OF ULCERS (sore throat, sores in mouth, or a toothache) UNUSUAL RASH, SWELLING OR PAIN  UNUSUAL VAGINAL DISCHARGE OR ITCHING   Items with * indicate a potential emergency and should be followed up as soon as possible or go to the Emergency Department if any problems should occur.  Please show the CHEMOTHERAPY ALERT CARD or IMMUNOTHERAPY  ALERT CARD at check-in to the Emergency Department and triage nurse.  Should you have questions after your visit or need to cancel or reschedule your appointment, please contact [facility] - A DEPT OF [facility]  Dept: [phone]  and follow the prompts.  Office hours are 8:00 a.m. to 4:30 p.m. Monday - Friday. Please note that voicemails left after 4:00 p.m. may not be returned until the following business day.  We are closed weekends and major holidays. You have access to a nurse at all times for urgent questions. Please call the main number to the clinic Dept: [phone] and follow the prompts.   For any non-urgent questions, you may also contact your provider using MyChart. We now offer e-Visits for anyone 38 and older to request care online for non-urgent symptoms. For details visit [email or web address].   Also download the MyChart app! Go to the app store, search "MyChart", open the app, select [place], and log in with your MyChart username and password.

## 2023-10-25 ENCOUNTER — Encounter: Payer: Self-pay | Admitting: Hematology

## 2023-10-25 MED ORDER — OXYCODONE HCL 10 MG PO TABS: 10.0000 mg | ORAL_TABLET | Freq: Four times a day (QID) | ORAL | 0 refills | Status: DC | PRN

## 2023-10-28 ENCOUNTER — Encounter: Payer: Self-pay | Admitting: Hematology

## 2023-10-29 ENCOUNTER — Telehealth: Payer: Self-pay

## 2023-10-29 NOTE — Telephone Encounter (Signed)
Call attempt to : let Mrs Duitsman know her PET/CT resulted and shows no new active myeloma lesions. No change in treatment plan.  Left pt a message

## 2023-11-02 ENCOUNTER — Other Ambulatory Visit: Payer: Self-pay | Admitting: Hematology

## 2023-11-02 DIAGNOSIS — C9000 Multiple myeloma not having achieved remission: Secondary | ICD-10-CM

## 2023-11-05 ENCOUNTER — Encounter: Payer: Self-pay | Admitting: Pharmacist

## 2023-11-05 ENCOUNTER — Inpatient Hospital Stay: Payer: Medicare PPO

## 2023-11-05 VITALS — BP 132/50 | HR 74 | Temp 97.8°F | Resp 16 | Wt 150.1 lb

## 2023-11-05 DIAGNOSIS — C9000 Multiple myeloma not having achieved remission: Secondary | ICD-10-CM

## 2023-11-05 DIAGNOSIS — Z7189 Other specified counseling: Secondary | ICD-10-CM

## 2023-11-05 DIAGNOSIS — Z79899 Other long term (current) drug therapy: Secondary | ICD-10-CM | POA: Diagnosis not present

## 2023-11-05 DIAGNOSIS — Z5112 Encounter for antineoplastic immunotherapy: Secondary | ICD-10-CM | POA: Diagnosis not present

## 2023-11-05 LAB — CBC WITH DIFFERENTIAL (CANCER CENTER ONLY)
Abs Immature Granulocytes: 0.02 10*3/uL (ref 0.00–0.07)
Basophils Absolute: 0.1 10*3/uL (ref 0.0–0.1)
Basophils Relative: 1 %
Eosinophils Absolute: 0.2 10*3/uL (ref 0.0–0.5)
Eosinophils Relative: 4 %
HCT: 36.4 % (ref 36.0–46.0)
Hemoglobin: 12.6 g/dL (ref 12.0–15.0)
Immature Granulocytes: 0 %
Lymphocytes Relative: 21 %
Lymphs Abs: 1.1 10*3/uL (ref 0.7–4.0)
MCH: 35.1 pg — ABNORMAL HIGH (ref 26.0–34.0)
MCHC: 34.6 g/dL (ref 30.0–36.0)
MCV: 101.4 fL — ABNORMAL HIGH (ref 80.0–100.0)
Monocytes Absolute: 0.9 10*3/uL (ref 0.1–1.0)
Monocytes Relative: 17 %
Neutro Abs: 3 10*3/uL (ref 1.7–7.7)
Neutrophils Relative %: 57 %
Platelet Count: 138 10*3/uL — ABNORMAL LOW (ref 150–400)
RBC: 3.59 MIL/uL — ABNORMAL LOW (ref 3.87–5.11)
RDW: 13.4 % (ref 11.5–15.5)
WBC Count: 5.2 10*3/uL (ref 4.0–10.5)
nRBC: 0 % (ref 0.0–0.2)

## 2023-11-05 LAB — CMP (CANCER CENTER ONLY)
ALT: 17 U/L (ref 0–44)
AST: 15 U/L (ref 15–41)
Albumin: 3.7 g/dL (ref 3.5–5.0)
Alkaline Phosphatase: 99 U/L (ref 38–126)
Anion gap: 9 (ref 5–15)
BUN: 15 mg/dL (ref 8–23)
CO2: 24 mmol/L (ref 22–32)
Calcium: 9.2 mg/dL (ref 8.9–10.3)
Chloride: 107 mmol/L (ref 98–111)
Creatinine: 0.98 mg/dL (ref 0.44–1.00)
GFR, Estimated: 58 mL/min — ABNORMAL LOW (ref >60)
Glucose, Bld: 288 mg/dL — ABNORMAL HIGH (ref 70–99)
Potassium: 3.7 mmol/L (ref 3.5–5.1)
Sodium: 140 mmol/L (ref 135–145)
Total Bilirubin: 0.7 mg/dL (ref <1.2)
Total Protein: 6 g/dL — ABNORMAL LOW (ref 6.5–8.1)

## 2023-11-05 MED ORDER — SODIUM CHLORIDE 0.9 % IV SOLN: Freq: Once | INTRAVENOUS | Status: AC

## 2023-11-05 MED ORDER — HEPARIN SOD (PORK) LOCK FLUSH 100 UNIT/ML IV SOLN
500.0000 [IU] | Freq: Once | INTRAVENOUS | Status: AC | PRN
  Administered 2023-11-05: 500 [IU]

## 2023-11-05 MED ORDER — PROCHLORPERAZINE MALEATE 10 MG PO TABS
10.0000 mg | ORAL_TABLET | Freq: Once | ORAL | Status: AC
  Administered 2023-11-05: 10 mg via ORAL
  Filled 2023-11-05: qty 1

## 2023-11-05 MED ORDER — DIPHENHYDRAMINE HCL 25 MG PO CAPS
25.0000 mg | ORAL_CAPSULE | Freq: Once | ORAL | Status: AC
  Administered 2023-11-05: 25 mg via ORAL
  Filled 2023-11-05: qty 1

## 2023-11-05 MED ORDER — ACETAMINOPHEN 325 MG PO TABS
650.0000 mg | ORAL_TABLET | Freq: Once | ORAL | Status: AC
  Administered 2023-11-05: 650 mg via ORAL
  Filled 2023-11-05: qty 2

## 2023-11-05 MED ORDER — SODIUM CHLORIDE 0.9% FLUSH
10.0000 mL | INTRAVENOUS | Status: DC | PRN
  Administered 2023-11-05: 10 mL

## 2023-11-05 MED ORDER — DEXAMETHASONE 4 MG PO TABS
8.0000 mg | ORAL_TABLET | Freq: Once | ORAL | Status: AC
  Administered 2023-11-05: 8 mg via ORAL
  Filled 2023-11-05: qty 2

## 2023-11-05 MED ORDER — FAMOTIDINE IN NACL 20-0.9 MG/50ML-% IV SOLN
20.0000 mg | Freq: Once | INTRAVENOUS | Status: AC
  Administered 2023-11-05: 20 mg via INTRAVENOUS
  Filled 2023-11-05: qty 50

## 2023-11-05 MED ORDER — CARFILZOMIB CHEMO INJECTION 60 MG
36.0000 mg/m2 | Freq: Once | INTRAVENOUS | Status: AC
  Administered 2023-11-05: 60 mg via INTRAVENOUS
  Filled 2023-11-05: qty 30

## 2023-11-05 NOTE — Patient Instructions (Signed)
 [facility] - A DEPT OF [facility]  Discharge Instructions: Thank you for choosing [facility] to provide your oncology and hematology care.   If you have a lab appointment with the Cancer Center, please go directly to the Cancer Center and check in at the registration area.   Wear comfortable clothing and clothing appropriate for easy access to any Portacath or PICC line.   We strive to give you quality time with your provider. You may need to reschedule your appointment if you arrive late (15 or more minutes).  Arriving late affects you and other patients whose appointments are after yours.  Also, if you miss three or more appointments without notifying the office, you may be dismissed from the clinic at the provider's discretion.      For prescription refill requests, have your pharmacy contact our office and allow 72 hours for refills to be completed.    Today you received the following chemotherapy and/or immunotherapy agents: Kyprolis      To help prevent nausea and vomiting after your treatment, we encourage you to take your nausea medication as directed.  BELOW ARE SYMPTOMS THAT SHOULD BE REPORTED IMMEDIATELY: *FEVER GREATER THAN 100.4 F (38 C) OR HIGHER *CHILLS OR SWEATING *NAUSEA AND VOMITING THAT IS NOT CONTROLLED WITH YOUR NAUSEA MEDICATION *UNUSUAL SHORTNESS OF BREATH *UNUSUAL BRUISING OR BLEEDING *URINARY PROBLEMS (pain or burning when urinating, or frequent urination) *BOWEL PROBLEMS (unusual diarrhea, constipation, pain near the anus) TENDERNESS IN MOUTH AND THROAT WITH OR WITHOUT PRESENCE OF ULCERS (sore throat, sores in mouth, or a toothache) UNUSUAL RASH, SWELLING OR PAIN  UNUSUAL VAGINAL DISCHARGE OR ITCHING   Items with * indicate a potential emergency and should be followed up as soon as possible or go to the Emergency Department if any problems should occur.  Please show the CHEMOTHERAPY ALERT CARD or IMMUNOTHERAPY  ALERT CARD at check-in to the Emergency Department and triage nurse.  Should you have questions after your visit or need to cancel or reschedule your appointment, please contact [facility] - A DEPT OF [facility]  Dept: [phone]  and follow the prompts.  Office hours are 8:00 a.m. to 4:30 p.m. Monday - Friday. Please note that voicemails left after 4:00 p.m. may not be returned until the following business day.  We are closed weekends and major holidays. You have access to a nurse at all times for urgent questions. Please call the main number to the clinic Dept: [phone] and follow the prompts.   For any non-urgent questions, you may also contact your provider using MyChart. We now offer e-Visits for anyone 38 and older to request care online for non-urgent symptoms. For details visit [email or web address].   Also download the MyChart app! Go to the app store, search "MyChart", open the app, select [place], and log in with your MyChart username and password.

## 2023-11-05 NOTE — Progress Notes (Signed)
Pharmacy Quality Measure Review  This patient is appearing on a report for being at risk of failing the adherence measure for diabetes medications this calendar year.   Medication: metformin Last fill date: 11/01/2023 for 90 day supply  Insurance report was not up to date. No action needed at this time.   Arbutus Leas, PharmD, BCPS Clinical Pharmacist [facility] [phone]

## 2023-11-08 ENCOUNTER — Other Ambulatory Visit: Payer: Self-pay | Admitting: Hematology

## 2023-11-08 DIAGNOSIS — C9000 Multiple myeloma not having achieved remission: Secondary | ICD-10-CM

## 2023-11-15 ENCOUNTER — Encounter: Payer: Self-pay | Admitting: Hematology

## 2023-11-16 ENCOUNTER — Other Ambulatory Visit: Payer: Self-pay

## 2023-11-16 DIAGNOSIS — C9000 Multiple myeloma not having achieved remission: Secondary | ICD-10-CM

## 2023-11-17 ENCOUNTER — Encounter: Payer: Self-pay | Admitting: Hematology

## 2023-11-17 DIAGNOSIS — H524 Presbyopia: Secondary | ICD-10-CM | POA: Diagnosis not present

## 2023-11-17 DIAGNOSIS — H04123 Dry eye syndrome of bilateral lacrimal glands: Secondary | ICD-10-CM | POA: Diagnosis not present

## 2023-11-17 MED ORDER — FENTANYL 12 MCG/HR TD PT72: 1.0000 | MEDICATED_PATCH | TRANSDERMAL | 0 refills | Status: DC

## 2023-11-18 ENCOUNTER — Other Ambulatory Visit: Payer: Self-pay | Admitting: Hematology

## 2023-11-18 DIAGNOSIS — C7951 Secondary malignant neoplasm of bone: Secondary | ICD-10-CM

## 2023-11-18 DIAGNOSIS — C9000 Multiple myeloma not having achieved remission: Secondary | ICD-10-CM

## 2023-11-19 ENCOUNTER — Other Ambulatory Visit: Payer: Self-pay | Admitting: Hematology

## 2023-11-19 ENCOUNTER — Inpatient Hospital Stay: Payer: Medicare PPO

## 2023-11-19 ENCOUNTER — Inpatient Hospital Stay: Payer: Medicare PPO | Attending: Hematology | Admitting: Hematology

## 2023-11-19 ENCOUNTER — Other Ambulatory Visit: Payer: Self-pay

## 2023-11-19 ENCOUNTER — Inpatient Hospital Stay: Payer: Medicare PPO | Attending: Hematology

## 2023-11-19 VITALS — BP 115/59 | HR 66 | Temp 97.6°F | Resp 18 | Wt 150.5 lb

## 2023-11-19 DIAGNOSIS — Z5111 Encounter for antineoplastic chemotherapy: Secondary | ICD-10-CM | POA: Diagnosis not present

## 2023-11-19 DIAGNOSIS — Z79899 Other long term (current) drug therapy: Secondary | ICD-10-CM | POA: Diagnosis not present

## 2023-11-19 DIAGNOSIS — Z7189 Other specified counseling: Secondary | ICD-10-CM

## 2023-11-19 DIAGNOSIS — C9000 Multiple myeloma not having achieved remission: Secondary | ICD-10-CM | POA: Insufficient documentation

## 2023-11-19 DIAGNOSIS — C9001 Multiple myeloma in remission: Secondary | ICD-10-CM

## 2023-11-19 DIAGNOSIS — Z5112 Encounter for antineoplastic immunotherapy: Secondary | ICD-10-CM | POA: Diagnosis not present

## 2023-11-19 LAB — CMP (CANCER CENTER ONLY)
ALT: 13 U/L (ref 0–44)
AST: 11 U/L — ABNORMAL LOW (ref 15–41)
Albumin: 3.8 g/dL (ref 3.5–5.0)
Alkaline Phosphatase: 90 U/L (ref 38–126)
Anion gap: 8 (ref 5–15)
BUN: 20 mg/dL (ref 8–23)
CO2: 25 mmol/L (ref 22–32)
Calcium: 10 mg/dL (ref 8.9–10.3)
Chloride: 107 mmol/L (ref 98–111)
Creatinine: 0.98 mg/dL (ref 0.44–1.00)
GFR, Estimated: 58 mL/min — ABNORMAL LOW (ref >60)
Glucose, Bld: 231 mg/dL — ABNORMAL HIGH (ref 70–99)
Potassium: 3.9 mmol/L (ref 3.5–5.1)
Sodium: 140 mmol/L (ref 135–145)
Total Bilirubin: 0.6 mg/dL (ref <1.2)
Total Protein: 5.9 g/dL — ABNORMAL LOW (ref 6.5–8.1)

## 2023-11-19 LAB — CBC WITH DIFFERENTIAL (CANCER CENTER ONLY)
Abs Immature Granulocytes: 0.01 10*3/uL (ref 0.00–0.07)
Basophils Absolute: 0.1 10*3/uL (ref 0.0–0.1)
Basophils Relative: 1 %
Eosinophils Absolute: 0 10*3/uL (ref 0.0–0.5)
Eosinophils Relative: 1 %
HCT: 37 % (ref 36.0–46.0)
Hemoglobin: 12.5 g/dL (ref 12.0–15.0)
Immature Granulocytes: 0 %
Lymphocytes Relative: 20 %
Lymphs Abs: 0.8 10*3/uL (ref 0.7–4.0)
MCH: 34.2 pg — ABNORMAL HIGH (ref 26.0–34.0)
MCHC: 33.8 g/dL (ref 30.0–36.0)
MCV: 101.1 fL — ABNORMAL HIGH (ref 80.0–100.0)
Monocytes Absolute: 0.8 10*3/uL (ref 0.1–1.0)
Monocytes Relative: 20 %
Neutro Abs: 2.3 10*3/uL (ref 1.7–7.7)
Neutrophils Relative %: 58 %
Platelet Count: 180 10*3/uL (ref 150–400)
RBC: 3.66 MIL/uL — ABNORMAL LOW (ref 3.87–5.11)
RDW: 13.3 % (ref 11.5–15.5)
WBC Count: 3.9 10*3/uL — ABNORMAL LOW (ref 4.0–10.5)
nRBC: 0 % (ref 0.0–0.2)

## 2023-11-19 MED ORDER — PROCHLORPERAZINE MALEATE 10 MG PO TABS
10.0000 mg | ORAL_TABLET | Freq: Once | ORAL | Status: AC
  Administered 2023-11-19: 10 mg via ORAL
  Filled 2023-11-19: qty 1

## 2023-11-19 MED ORDER — SODIUM CHLORIDE 0.9 % IV SOLN: Freq: Once | INTRAVENOUS | Status: AC

## 2023-11-19 MED ORDER — DEXTROSE 5 % IV SOLN
36.0000 mg/m2 | Freq: Once | INTRAVENOUS | Status: AC
  Administered 2023-11-19: 60 mg via INTRAVENOUS
  Filled 2023-11-19: qty 30

## 2023-11-19 MED ORDER — ACETAMINOPHEN 325 MG PO TABS
650.0000 mg | ORAL_TABLET | Freq: Once | ORAL | Status: AC
  Administered 2023-11-19: 650 mg via ORAL
  Filled 2023-11-19: qty 2

## 2023-11-19 MED ORDER — DEXAMETHASONE 4 MG PO TABS
8.0000 mg | ORAL_TABLET | Freq: Once | ORAL | Status: AC
  Administered 2023-11-19: 8 mg via ORAL
  Filled 2023-11-19: qty 2

## 2023-11-19 MED ORDER — DIPHENHYDRAMINE HCL 25 MG PO CAPS
25.0000 mg | ORAL_CAPSULE | Freq: Once | ORAL | Status: AC
  Administered 2023-11-19: 25 mg via ORAL
  Filled 2023-11-19: qty 1

## 2023-11-19 MED ORDER — HEPARIN SOD (PORK) LOCK FLUSH 100 UNIT/ML IV SOLN
500.0000 [IU] | Freq: Once | INTRAVENOUS | Status: AC | PRN
  Administered 2023-11-19: 500 [IU]

## 2023-11-19 MED ORDER — FAMOTIDINE IN NACL 20-0.9 MG/50ML-% IV SOLN
20.0000 mg | Freq: Once | INTRAVENOUS | Status: AC
  Administered 2023-11-19: 20 mg via INTRAVENOUS
  Filled 2023-11-19: qty 50

## 2023-11-19 MED ORDER — SODIUM CHLORIDE 0.9% FLUSH
10.0000 mL | INTRAVENOUS | Status: DC | PRN
  Administered 2023-11-19: 10 mL

## 2023-11-19 NOTE — Progress Notes (Signed)
Patient seen by Dr. Kale  Vitals are within treatment parameters.  Labs reviewed: and are within treatment parameters.  Per physician team, patient is ready for treatment and there are NO modifications to the treatment plan.  

## 2023-11-19 NOTE — Progress Notes (Signed)
HEMATOLOGY/ONCOLOGY CLINIC NOTE  Date of Service: 11/19/23   Patient Care Team: Myrlene Broker, MD as PCP - General (Internal Medicine) Hart Carwin, MD (Inactive) as Consulting Physician (Gastroenterology) Jerene Bears, MD as Consulting Physician (Gynecology) Marcene Corning, MD as Consulting Physician (Orthopedic Surgery) Waymon Budge, MD as Consulting Physician (Pulmonary Disease) Mateo Flow, MD (Ophthalmology)  CHIEF COMPLAINTS/PURPOSE OF VISIT:  Follow-up for continued evaluation and management of multiple myeloma  HISTORY OF PRESENTING ILLNESS:  Please see previous notes for details on initial presentation  Current Treatment: Carfilzomib + Revlimid maintenance.  INTERVAL HISTORY:   Faith Orr is a 82 y.o. female who is here for continued evaluation and management of the patient's multiple myeloma. She is here to start cycle 10 day 15 of her treatment.   Patient was last seen by me on 10/22/2023 and reported mild fatigue and mild back soreness with bending.  Today, she reports that she has been doing fairly well since her last visit. Patient has been tolerating Carfilzomib and Revlimid well with no new or severe toxicities.   She reports stable manageable back pain and her fatigue has improved. Her bowel habits have been normal and she denies any new bone pains. She denies any leg swelling. She denies starting any new medications since her last visit.   Patient reports that earlier in the week, she endorsed a sneezing episode along with rhinorrhea, which resolved after use of OnGuard natural oil. She has no infection issues otherwise.   She reports that she is currently on 500 MG Metformin twice a day to manage her blood sugars. Patient reports that she does not check her blood sugars at home.   Patient reports that she did receive the flu and COVID-19 vaccines recently and is unsure if she received the RSV vaccine last year.   Patient reports  that she will be traveling to the [facility] for Christmas.  MEDICAL HISTORY:  Past Medical History:  Diagnosis Date   Allergy    seasonal   Asthma    DEPRESSION    DIABETES MELLITUS, TYPE II    Diverticulosis    HYPERLIPIDEMIA    Macular degeneration of left eye    mild, Dr.Hecker   Obesity, unspecified    Osteoarthritis of both knees    OSTEOPENIA    Osteopenia    URINARY INCONTINENCE     SURGICAL HISTORY: Past Surgical History:  Procedure Laterality Date   CATARACT EXTRACTION Left 05/24/2018   CESAREAN SECTION  01/1973   CYSTOSCOPY/URETEROSCOPY/HOLMIUM LASER/STENT PLACEMENT Right 09/20/2019   Procedure: CYSTOSCOPY/URETEROSCOPY/HOLMIUM LASER/STENT PLACEMENT;  Surgeon: [name], MD;  Location: [facility];  Service: Urology;  Laterality: Right;   FRACTURE SURGERY     IR BONE MARROW BIOPSY & ASPIRATION  02/16/2023   IR IMAGING GUIDED PORT INSERTION  02/20/2019   left wrist surgery  2008   By Dr. Yisroel Ramming   right ankle  1994    SOCIAL HISTORY: Social History   Socioeconomic History   Marital status: Married    Spouse name: Not on file   Number of children: 1   Years of education: Not on file   Highest education level: Not on file  Occupational History    Employer: [employer]  Tobacco Use   Smoking status: Never   Smokeless tobacco: Never   Tobacco comments:    Lives with partner [name]) and son  Vaping Use   Vaping status: Never Used  Substance and Sexual Activity   Alcohol use: No    Alcohol/week: 0.0 standard drinks of alcohol   Drug use: No   Sexual activity: Never    Partners: Female    Birth control/protection: Post-menopausal    Comment: Lives with female partner (annette hicks) and 39 yo son  Other Topics Concern   Not on file  Social History Narrative   Not on file   Social Determinants of Health   Financial Resource Strain: Low Risk  (09/09/2023)   Overall Financial Resource Strain (CARDIA)     Difficulty of Paying Living Expenses: Not hard at all  Food Insecurity: No Food Insecurity (09/09/2023)   Hunger Vital Sign    Worried About Running Out of Food in the Last Year: Never true    Ran Out of Food in the Last Year: Never true  Transportation Needs: No Transportation Needs (09/09/2023)   PRAPARE - Administrator, Civil Service (Medical): No    Lack of Transportation (Non-Medical): No  Physical Activity: Inactive (09/09/2023)   Exercise Vital Sign    Days of Exercise per Week: 0 days    Minutes of Exercise per Session: 0 min  Stress: No Stress Concern Present (09/09/2023)   [employer] of Occupational Health - Occupational Stress Questionnaire    Feeling of Stress : Not at all  Social Connections: Socially Integrated (09/09/2023)   Social Connection and Isolation Panel [NHANES]    Frequency of Communication with Friends and Family: More than three times a week    Frequency of Social Gatherings with Friends and Family: More than three times a week    Attends Religious Services: More than 4 times per year    Active Member of [employer] or Organizations: Yes    Attends Engineer, structural: More than 4 times per year    Marital Status: Married  Catering manager Violence: Not At Risk (05/08/2021)   Humiliation, Afraid, Rape, and Kick questionnaire    Fear of Current or Ex-Partner: No    Emotionally Abused: No    Physically Abused: No    Sexually Abused: No    FAMILY HISTORY: Family History  Problem Relation Age of Onset   Diabetes Father    Hyperlipidemia Father    Heart disease Father    Cancer Father    Hypertension Father    Colon cancer Paternal Grandmother 31   Osteoporosis Mother    Protein S deficiency Mother    Hyperlipidemia Mother    Multiple sclerosis Daughter    Cancer Other        bladder   Breast cancer Neg Hx     ALLERGIES:  is allergic to levofloxacin, penicillins, aleve [naproxen sodium], and sulfonamide  derivatives.  MEDICATIONS:  Current Outpatient Medications  Medication Sig Dispense Refill   Acetaminophen (TYLENOL PO) Take by mouth as needed.     acyclovir (ZOVIRAX) 400 MG tablet TAKE 1 TABLET(400 MG) BY MOUTH TWICE DAILY 60 tablet 5   Blood Glucose Monitoring Suppl (FREESTYLE FREEDOM LITE) W/DEVICE KIT Use to check blood sugars twice a day Dx 250.00 1 each 0   Calcium Carbonate-Vitamin D 600-400 MG-UNIT tablet Take 1 tablet by mouth 2 (two) times daily.     dexamethasone (DECADRON) 4 MG tablet Take 5 tablets (20 mg total) by mouth once a week. On D22 of each cycle of treatment 20 tablet 5   ELIQUIS 2.5 MG TABS tablet TAKE 1 TABLET(2.5 MG) BY MOUTH TWICE DAILY 60 tablet 2  fentaNYL (DURAGESIC) 12 MCG/HR Place 1 patch onto the skin every 3 (three) days. 10 patch 0   fluticasone (FLONASE) 50 MCG/ACT nasal spray Place 1 spray into both nostrils daily. 48 g 3   glucose blood (FREESTYLE LITE) test strip CHECK BLOOD SUGAR TWICE DAILY AS DIRECTED Dx 250.00 180 each 3   Lancets (FREESTYLE) lancets Use twice daily to check sugars. 100 each 11   lenalidomide (REVLIMID) 5 MG capsule TAKE 1 CAPSULE BY MOUTH DAILY  FOR 21 DAYS, THEN 7 DAYS OFF 21 capsule 0   lidocaine (LIDODERM) 5 % Place 1 patch onto the skin daily. Remove & Discard patch within 12 hours or as directed by MD 30 patch 0   lidocaine-prilocaine (EMLA) cream APPLY 1 APPLICATION TO THE AFFECTED AREA AS NEEDED. USE PRIOR TO PORT ACCESS 30 g 0   meclizine (ANTIVERT) 25 MG tablet Take 1 tablet (25 mg total) by mouth every 8 (eight) hours as needed for dizziness. 20 tablet 0   metFORMIN (GLUCOPHAGE-XR) 500 MG 24 hr tablet Take 1 tablet (500 mg total) by mouth 2 (two) times daily with a meal. 180 tablet 3   metroNIDAZOLE (METROGEL) 1 % gel Apply topically in the morning and at bedtime. 60 g 1   Multiple Vitamins-Minerals (ICAPS) CAPS Take 1 capsule by mouth daily after breakfast.     ondansetron (ZOFRAN) 8 MG tablet Take 1 tablet (8 mg total)  by mouth 2 (two) times daily as needed (Nausea or vomiting). 30 tablet 1   Oxycodone HCl 10 MG TABS Take 1 tablet (10 mg total) by mouth every 6 (six) hours as needed. 90 tablet 0   pantoprazole (PROTONIX) 20 MG tablet TAKE 1 TABLET(20 MG) BY MOUTH DAILY 30 tablet 2   polyethylene glycol (MIRALAX / GLYCOLAX) packet Take 17 g by mouth daily after breakfast.     potassium chloride SA (KLOR-CON M) 20 MEQ tablet Take 2 tablets (40 mEq total) by mouth 2 (two) times daily. 360 tablet 1   prochlorperazine (COMPAZINE) 10 MG tablet Take 1 tablet (10 mg total) by mouth every 6 (six) hours as needed (Nausea or vomiting). 30 tablet 1   senna-docusate (SENNA S) 8.6-50 MG tablet Take 2 tablets by mouth at bedtime. 60 tablet 2   sertraline (ZOLOFT) 100 MG tablet Take 1 tablet (100 mg total) by mouth daily. 90 tablet 3   simvastatin (ZOCOR) 20 MG tablet Take 1 tablet (20 mg total) by mouth daily at 6 PM. 90 tablet 3   Vitamin D, Ergocalciferol, (DRISDOL) 1.25 MG (50000 UNIT) CAPS capsule TAKE 1 CAPSULE BY MOUTH EVERY 7 DAYS 12 capsule 0   No current facility-administered medications for this visit.   Facility-Administered Medications Ordered in Other Visits  Medication Dose Route Frequency Provider Last Rate Last Admin   [MAR Hold] heparin lock flush 100 unit/mL  500 Units Intracatheter Once PRN Johney Maine, MD        REVIEW OF SYSTEMS:    10 Point review of Systems was done is negative except as noted above.   PHYSICAL EXAMINATION: ECOG FS:2 - Symptomatic, <50% confined to bed .LMP 10/09/2012    Wt Readings from Last 3 Encounters:  11/05/23 150 lb 2 oz (68.1 kg)  10/22/23 150 lb (68 kg)  10/08/23 150 lb 6.4 oz (68.2 kg)   There is no height or weight on file to calculate BMI.     GENERAL:alert, in no acute distress and comfortable SKIN: no acute rashes, no significant lesions EYES:  conjunctiva are pink and non-injected, sclera anicteric OROPHARYNX: MMM, no exudates, no oropharyngeal  erythema or ulceration NECK: supple, no JVD LYMPH:  no palpable lymphadenopathy in the cervical, axillary or inguinal regions LUNGS: clear to auscultation b/l with normal respiratory effort HEART: regular rate & rhythm ABDOMEN:  normoactive bowel sounds , non tender, not distended. Extremity: no pedal edema PSYCH: alert & oriented x 3 with fluent speech NEURO: no focal motor/sensory deficits   LABS     Latest Ref Rng & Units 11/05/2023   12:52 PM 10/22/2023    9:05 AM 10/08/2023    1:14 PM  CBC  WBC 4.0 - 10.5 K/uL 5.2  4.4  3.8   Hemoglobin 12.0 - 15.0 g/dL 16.1  09.6  04.5   Hematocrit 36.0 - 46.0 % 36.4  37.6  37.2   Platelets 150 - 400 K/uL 138  174  144       Latest Ref Rng & Units 11/05/2023   12:52 PM 10/22/2023    9:05 AM 10/08/2023    1:14 PM  CMP  Glucose 70 - 99 mg/dL 409  811  914   BUN 8 - 23 mg/dL 15  17  17    Creatinine 0.44 - 1.00 mg/dL 7.82  9.56  2.13   Sodium 135 - 145 mmol/L 140  140  139   Potassium 3.5 - 5.1 mmol/L 3.7  3.9  3.6   Chloride 98 - 111 mmol/L 107  108  111   CO2 22 - 32 mmol/L 24  25  21    Calcium 8.9 - 10.3 mg/dL 9.2  9.8  9.3   Total Protein 6.5 - 8.1 g/dL 6.0  6.0  6.0   Total Bilirubin <1.2 mg/dL 0.7  0.7  0.7   Alkaline Phos 38 - 126 U/L 99  91  98   AST 15 - 41 U/L 15  12  14    ALT 0 - 44 U/L 17  12  15       09/18/2019 BM Bx Report ([id number])   09/18/2019 FISH Panel    05/30/2019 BM Bx   01/06/2019 BM Bx:     01/06/19 Cytogenetics:      05/30/19 BM Biopsy:   09/18/2019 FISH Panel    09/18/2019 BM Surgical Pathology ([id number])     RADIOGRAPHIC STUDIES: I have personally reviewed the radiological images as listed and agreed with the findings in the report. No results found.   ASSESSMENT & PLAN:   82 y.o. female with  1.  Nonsecretory multiple Myeloma, RISS Stage III  Labs upon initial presentation from 12/08/18, blood counts are normal including WBC at 7.1k, HGB at 13.1, and PLT at  245k. Calcium normal at 10.3. Creatinine normal at 0.63. M spike at 0.5g. 12/13/18 Bone Scan revealed Multifocal uptake throughout the skeleton, consistent with diffuse metastatic disease. Primary tumor is not specified. 2. Uptake in the proximal right femur, consistent with lytic lesions. 3. Uptake in the ribs bilaterally as described. 4. Lesions in the proximal left humerus. 5. Diffuse uptake throughout the skull consistent with metastatic disease. 6. Right paramedian uptake at the manubrium.  12/13/18 CT Right Femur revealed Numerous lytic lesions involving the right femur and a lytic lesion in the left inferior pubic ramus. Overall appearance is most concerning for multiple myeloma  12/27/18 Pretreatment 24hour UPEP observed an M spike at 18mg , and showed 199mg  total protein/day.  12/27/18 Pretreatment MMP revealed M Protein at 0.5g with IgG Lambda specificity. Kappa:Lambda light chain ratio at  0.13, with Lambda at 40.3. There is less abnormal protein and light chains than I would expect from 30% plasma cells, which suggests hypo-secretory or non-secretory neoplastic plasma cells. Will have an impact in assessing response. 01/05/19 PET/CT revealed Innumerable lytic lesions in the skeleton compatible with myeloma. Most of the larger lesions are hypermetabolic, for example including a left proximal humeral shaft lesion with maximum SUV of 8.1 and a 2.8 cm lesion in the left T9 vertebral body with maximum SUV 5.1. Most of the smaller lytic lesions, and some of the larger lesions, do not demonstrate accentuated metabolic activity. 2. 1.2 cm in short axis lymph node in the left parapharyngeal space is hypermetabolic with maximum SUV 11.8. I do not see a separate mass in the head and neck to give rise to this hypermetabolic lymph node. 3. Mosaic attenuation in the lower lobes, nonspecific possibly from air trapping. 4.  Aortic Atherosclerosis 5. Heterogeneous activity in the liver, making it hard to exclude small  liver lesions. Consider hepatic protocol MRI with and without contrast for definitive assessment. Nonobstructive right nephrolithiasis. Old granulomatous disease  01/06/19 Bone Marrow biopsy revealed interstitial increase in plasma cells (28% aspirate, 40% CD138 immunohistochemistry). Plasma cells negative for light chains consistent with a non or weakly secretory myeloma   01/06/19 Cytogenetics revealed 37% of cells with trisomy 11 or 11q deletion, and 40.5% of cells with 17p mutation  S/p 5 cycles of KRD treatment  05/31/19 BM Biopsy revealed mild atypical plasmacytosis at 5% with polytypic variation.   06/01/19 PET/CT revealed "Dominant lesion in the LEFT humerus is decreased significantly in metabolic activity. Additional hypermetabolic skeletal lytic lesions have decreased in metabolic activity or similar to comparison exam (01/05/2019). No evidence of disease progression. 2. Multiple additional lytic lesions do not have metabolic activity and unchanged. 3. No new skeletal lesions are identified. No soft tissue plasmacytoma identified. 4. Nodule / node in the LEFT parapharyngeal space which is intensely hypermetabolic not changed from prior. 5. New hypermetabolic LEFT lower lobe pulmonary nodule is indeterminate. Recommend close attention on follow-up 6. New obstructive hydronephrosis of the RIGHT kidney related to RIGHT UPJ stone."  09/18/2019 BM Bx Report which revealed "Slightly hypercellular bone marrow for age with trilineage hematopoiesis and 1% plasma cells."  09/14/2019 PET/CT Whole Body Scan ([id number]) which revealed "1. There widespread tiny lytic lesions compatible with multiple myeloma. Index larger lesions are generally similar to the prior exam, with low-grade activity such as the left T9 vertebral body lesion with maximum SUV 4.5. Is mild increase in the activity associated with a mildly sclerotic left proximal humeral lesion, maximum SUV 4.8 (previously 3.5). 2. At the site of the  prior left lower lobe nodule is currently more bandlike thickening, with maximum SUV only 1.9, probably benign, continued surveillance of this region suggested. 3. There several small but hypermetabolic lymph nodes. This includes a left parapharyngeal space node measuring 1.0 cm with maximum SUV 12.3 (stable); a left level IB lymph node measuring 0.5 cm with maximum SUV 4.8 (slightly larger than prior); and a left inguinal lymph node measuring 0.7 cm in short axis with maximum SUV 6.4 (previously 0.5 cm with maximum SUV 0.6). Significance of these lymph nodes uncertain, surveillance is recommended. 4. New 5 mm left lower lobe subpleural nodule on image 32/8, not appreciably hypermetabolic, surveillance suggested. 5. Focal subcutaneous stranding along the left perineum measuring about 2.6 by 1.1 cm on image 221/4, maximum SUV 12.5. This was not present previously and is most likely inflammatory,  although given the notable SUV, surveillance of this region is suggested. 6. Other imaging findings of potential clinical significance: Aortic Atherosclerosis (ICD10-I70.0). Coronary atherosclerosis. Old granulomatous disease. Mild right hydronephrosis due to a 7 mm right UPJ calculus. 2 mm right kidney upper pole nonobstructive renal calculus. Prominent stool throughout the colon favors constipation."  12/19/2019 Thoracic & Lumbar Spine MRI ([id number]) ([id number]) revealed "Suspected myeloma lesions at T9 and S1. No compression deformity or epidural disease.  01/18/2020 PET/CT ([id number]) which revealed "1. Stable lytic lesions throughout the skeleton. The larger lytic lesions which had mild metabolic activity on comparison exam now have background metabolic activity. No evidence of active myeloma. No evidence of progression multiple myeloma.  No plasmacytoma 3. Hypermetabolic nodules in the LEFT neck may be associated deep tissues of the LEFT parotid gland. 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  01/17/19 MRI Liver revealed Several appreciable liver lesions all have benign imaging characteristics. No MRI findings of metastatic involvement of the liver. 2. Scattered bony lesions corresponding to the lytic lesions seen at PET-CT, compatible with active myeloma. 3. Aortic Atherosclerosis.  Mild cardiomegaly. 4. Diffuse hepatic steatosis.   3. Left lower lobe pulmonary nodule First seen on 06/01/19 PET/CT PET/CT 08/27/2020: No hypermetabolic mediastinal or hilar nodes. No suspicious pulmonary nodules on the CT scan.  4. 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 Being managed conservatively as per patient's preference.  No symptoms from this currently.  5. Mild chronic kidney disease CMP done 04/09/2022 showed creatinine of 1.17 and GFR est of 48 consistent with mild chronic kidney disease.  PLAN:  -Discussed lab results on 11/19/23 in detail with patient. CBC stable, showed WBC of 3.9K, hemoglobin of 12.5, and platelets of 180K. -CMP stable -glucose level is mildly high at 231 -recent PET scan findings were stable -continue maintenance Carfilzomib and Revlimid for high risk myeloma  -continue acyclovir for protocol. -advised patient to stay UTD with her age-appropriate vaccines, including flu, COVID-19, and RSV -will refill fentanyl patch -there may be a role for her PCP to adjust some of her medications to manage blood sugars -answered all of patient's questions in detail  FOLLOW UP: Per integrated scheduling  The total time spent in the appointment was *** minutes* .  All of the patient's questions were answered with apparent satisfaction. The patient knows to call the  clinic with any problems, questions or concerns.   Wyvonnia Lora MD MS AAHIVMS [facility] Hematology/Oncology Physician [facility]  .*Total Encounter Time as defined by the Centers for Medicare and Medicaid Services includes, in addition to the face-to-face time of a patient visit (documented in the note above) non-face-to-face time: obtaining and reviewing outside history, ordering and reviewing medications, tests or procedures, care coordination (communications with other health care professionals or caregivers) and documentation in the medical record.    I,Mitra Faeizi,acting as a Neurosurgeon for Wyvonnia Lora, MD.,have documented all relevant documentation on the behalf of Wyvonnia Lora, MD,as directed by  Wyvonnia Lora, MD while in the presence of Wyvonnia Lora, MD.  ***

## 2023-11-19 NOTE — Patient Instructions (Signed)
 [facility]  Discharge Instructions: Thank you for choosing [facility] to provide your oncology and hematology care.   If you have a lab appointment with the Cancer Center, please go directly to the Cancer Center and check in at the registration area.   Wear comfortable clothing and clothing appropriate for easy access to any Portacath or PICC line.   We strive to give you quality time with your provider. You may need to reschedule your appointment if you arrive late (15 or more minutes).  Arriving late affects you and other patients whose appointments are after yours.  Also, if you miss three or more appointments without notifying the office, you may be dismissed from the clinic at the provider's discretion.      For prescription refill requests, have your pharmacy contact our office and allow 72 hours for refills to be completed.    Today you received the following chemotherapy and/or immunotherapy agents: Kyprolis      To help prevent nausea and vomiting after your treatment, we encourage you to take your nausea medication as directed.  BELOW ARE SYMPTOMS THAT SHOULD BE REPORTED IMMEDIATELY: *FEVER GREATER THAN 100.4 F (38 C) OR HIGHER *CHILLS OR SWEATING *NAUSEA AND VOMITING THAT IS NOT CONTROLLED WITH YOUR NAUSEA MEDICATION *UNUSUAL SHORTNESS OF BREATH *UNUSUAL BRUISING OR BLEEDING *URINARY PROBLEMS (pain or burning when urinating, or frequent urination) *BOWEL PROBLEMS (unusual diarrhea, constipation, pain near the anus) TENDERNESS IN MOUTH AND THROAT WITH OR WITHOUT PRESENCE OF ULCERS (sore throat, sores in mouth, or a toothache) UNUSUAL RASH, SWELLING OR PAIN  UNUSUAL VAGINAL DISCHARGE OR ITCHING   Items with * indicate a potential emergency and should be followed up as soon as possible or go to the Emergency Department if any problems should occur.  Please show the CHEMOTHERAPY ALERT CARD or IMMUNOTHERAPY  ALERT CARD at check-in to the Emergency Department and triage nurse.  Should you have questions after your visit or need to cancel or reschedule your appointment, please contact [facility]  Dept: [phone]  and follow the prompts.  Office hours are 8:00 a.m. to 4:30 p.m. Monday - Friday. Please note that voicemails left after 4:00 p.m. may not be returned until the following business day.  We are closed weekends and major holidays. You have access to a nurse at all times for urgent questions. Please call the main number to the clinic Dept: [phone] and follow the prompts.   For any non-urgent questions, you may also contact your provider using MyChart. We now offer e-Visits for anyone 73 and older to request care online for non-urgent symptoms. For details visit [email or web address].   Also download the MyChart app! Go to the app store, search "MyChart", open the app, select [place], and log in with your MyChart username and password.

## 2023-11-25 ENCOUNTER — Encounter: Payer: Self-pay | Admitting: Hematology

## 2023-11-30 ENCOUNTER — Other Ambulatory Visit: Payer: Self-pay

## 2023-11-30 DIAGNOSIS — C9000 Multiple myeloma not having achieved remission: Secondary | ICD-10-CM

## 2023-11-30 MED ORDER — LENALIDOMIDE 5 MG PO CAPS: ORAL_CAPSULE | ORAL | 0 refills | Status: DC

## 2023-12-03 ENCOUNTER — Inpatient Hospital Stay: Payer: Medicare PPO

## 2023-12-03 VITALS — BP 125/59 | HR 73 | Temp 99.1°F | Resp 16 | Wt 151.8 lb

## 2023-12-03 DIAGNOSIS — C9000 Multiple myeloma not having achieved remission: Secondary | ICD-10-CM

## 2023-12-03 DIAGNOSIS — Z79899 Other long term (current) drug therapy: Secondary | ICD-10-CM | POA: Diagnosis not present

## 2023-12-03 DIAGNOSIS — Z5112 Encounter for antineoplastic immunotherapy: Secondary | ICD-10-CM | POA: Diagnosis not present

## 2023-12-03 DIAGNOSIS — Z7189 Other specified counseling: Secondary | ICD-10-CM

## 2023-12-03 LAB — CBC WITH DIFFERENTIAL (CANCER CENTER ONLY)
Abs Immature Granulocytes: 0.01 10*3/uL (ref 0.00–0.07)
Basophils Absolute: 0.1 10*3/uL (ref 0.0–0.1)
Basophils Relative: 1 %
Eosinophils Absolute: 0.1 10*3/uL (ref 0.0–0.5)
Eosinophils Relative: 3 %
HCT: 36.2 % (ref 36.0–46.0)
Hemoglobin: 12.7 g/dL (ref 12.0–15.0)
Immature Granulocytes: 0 %
Lymphocytes Relative: 25 %
Lymphs Abs: 1.1 10*3/uL (ref 0.7–4.0)
MCH: 35.2 pg — ABNORMAL HIGH (ref 26.0–34.0)
MCHC: 35.1 g/dL (ref 30.0–36.0)
MCV: 100.3 fL — ABNORMAL HIGH (ref 80.0–100.0)
Monocytes Absolute: 0.8 10*3/uL (ref 0.1–1.0)
Monocytes Relative: 18 %
Neutro Abs: 2.2 10*3/uL (ref 1.7–7.7)
Neutrophils Relative %: 53 %
Platelet Count: 134 10*3/uL — ABNORMAL LOW (ref 150–400)
RBC: 3.61 MIL/uL — ABNORMAL LOW (ref 3.87–5.11)
RDW: 13.6 % (ref 11.5–15.5)
WBC Count: 4.2 10*3/uL (ref 4.0–10.5)
nRBC: 0 % (ref 0.0–0.2)

## 2023-12-03 LAB — CMP (CANCER CENTER ONLY)
ALT: 17 U/L (ref 0–44)
AST: 13 U/L — ABNORMAL LOW (ref 15–41)
Albumin: 3.8 g/dL (ref 3.5–5.0)
Alkaline Phosphatase: 101 U/L (ref 38–126)
Anion gap: 9 (ref 5–15)
BUN: 18 mg/dL (ref 8–23)
CO2: 23 mmol/L (ref 22–32)
Calcium: 9.4 mg/dL (ref 8.9–10.3)
Chloride: 108 mmol/L (ref 98–111)
Creatinine: 0.94 mg/dL (ref 0.44–1.00)
GFR, Estimated: >60 mL/min (ref >60)
Glucose, Bld: 275 mg/dL — ABNORMAL HIGH (ref 70–99)
Potassium: 3.5 mmol/L (ref 3.5–5.1)
Sodium: 140 mmol/L (ref 135–145)
Total Bilirubin: 0.5 mg/dL (ref <1.2)
Total Protein: 5.7 g/dL — ABNORMAL LOW (ref 6.5–8.1)

## 2023-12-03 MED ORDER — PROCHLORPERAZINE MALEATE 10 MG PO TABS
10.0000 mg | ORAL_TABLET | Freq: Once | ORAL | Status: AC
  Administered 2023-12-03: 10 mg via ORAL
  Filled 2023-12-03: qty 1

## 2023-12-03 MED ORDER — SODIUM CHLORIDE 0.9% FLUSH
10.0000 mL | INTRAVENOUS | Status: DC | PRN
  Administered 2023-12-03: 10 mL

## 2023-12-03 MED ORDER — SODIUM CHLORIDE 0.9 % IV SOLN: Freq: Once | INTRAVENOUS | Status: AC

## 2023-12-03 MED ORDER — DIPHENHYDRAMINE HCL 25 MG PO CAPS
25.0000 mg | ORAL_CAPSULE | Freq: Once | ORAL | Status: AC
  Administered 2023-12-03: 25 mg via ORAL
  Filled 2023-12-03: qty 1

## 2023-12-03 MED ORDER — SODIUM CHLORIDE 0.9% FLUSH
10.0000 mL | Freq: Once | INTRAVENOUS | Status: AC
  Administered 2023-12-03: 10 mL via INTRAVENOUS

## 2023-12-03 MED ORDER — FAMOTIDINE IN NACL 20-0.9 MG/50ML-% IV SOLN
20.0000 mg | Freq: Once | INTRAVENOUS | Status: AC
  Administered 2023-12-03: 20 mg via INTRAVENOUS
  Filled 2023-12-03: qty 50

## 2023-12-03 MED ORDER — HEPARIN SOD (PORK) LOCK FLUSH 100 UNIT/ML IV SOLN
500.0000 [IU] | Freq: Once | INTRAVENOUS | Status: AC | PRN
  Administered 2023-12-03: 500 [IU]

## 2023-12-03 MED ORDER — HEPARIN SOD (PORK) LOCK FLUSH 100 UNIT/ML IV SOLN: 500.0000 [IU] | Freq: Once | INTRAVENOUS | Status: DC

## 2023-12-03 MED ORDER — DEXTROSE 5 % IV SOLN
36.0000 mg/m2 | Freq: Once | INTRAVENOUS | Status: AC
  Administered 2023-12-03: 60 mg via INTRAVENOUS
  Filled 2023-12-03: qty 30

## 2023-12-03 MED ORDER — DEXAMETHASONE 4 MG PO TABS
8.0000 mg | ORAL_TABLET | Freq: Once | ORAL | Status: AC
Start: 2023-12-03 — End: 2023-12-03
  Administered 2023-12-03: 8 mg via ORAL
  Filled 2023-12-03: qty 2

## 2023-12-03 MED ORDER — ACETAMINOPHEN 325 MG PO TABS
650.0000 mg | ORAL_TABLET | Freq: Once | ORAL | Status: AC
  Administered 2023-12-03: 650 mg via ORAL
  Filled 2023-12-03: qty 2

## 2023-12-03 NOTE — Patient Instructions (Signed)
[facility]  Discharge Instructions: Thank you for choosing [facility] to provide your oncology and hematology care.   If you have a lab appointment with the Cancer Center, please go directly to the Cancer Center and check in at the registration area.   Wear comfortable clothing and clothing appropriate for easy access to any Portacath or PICC line.   We strive to give you quality time with your provider. You may need to reschedule your appointment if you arrive late (15 or more minutes).  Arriving late affects you and other patients whose appointments are after yours.  Also, if you miss three or more appointments without notifying the office, you may be dismissed from the clinic at the provider's discretion.      For prescription refill requests, have your pharmacy contact our office and allow 72 hours for refills to be completed.    Today you received the following chemotherapy and/or immunotherapy agents: carfilzomib      To help prevent nausea and vomiting after your treatment, we encourage you to take your nausea medication as directed.  BELOW ARE SYMPTOMS THAT SHOULD BE REPORTED IMMEDIATELY: *FEVER GREATER THAN 100.4 F (38 C) OR HIGHER *CHILLS OR SWEATING *NAUSEA AND VOMITING THAT IS NOT CONTROLLED WITH YOUR NAUSEA MEDICATION *UNUSUAL SHORTNESS OF BREATH *UNUSUAL BRUISING OR BLEEDING *URINARY PROBLEMS (pain or burning when urinating, or frequent urination) *BOWEL PROBLEMS (unusual diarrhea, constipation, pain near the anus) TENDERNESS IN MOUTH AND THROAT WITH OR WITHOUT PRESENCE OF ULCERS (sore throat, sores in mouth, or a toothache) UNUSUAL RASH, SWELLING OR PAIN  UNUSUAL VAGINAL DISCHARGE OR ITCHING   Items with * indicate a potential emergency and should be followed up as soon as possible or go to the Emergency Department if any problems should occur.  Please show the CHEMOTHERAPY ALERT CARD or  IMMUNOTHERAPY ALERT CARD at check-in to the Emergency Department and triage nurse.  Should you have questions after your visit or need to cancel or reschedule your appointment, please contact [facility]  Dept: [phone]  and follow the prompts.  Office hours are 8:00 a.m. to 4:30 p.m. Monday - Friday. Please note that voicemails left after 4:00 p.m. may not be returned until the following business day.  We are closed weekends and major holidays. You have access to a nurse at all times for urgent questions. Please call the main number to the clinic Dept: [phone] and follow the prompts.   For any non-urgent questions, you may also contact your provider using MyChart. We now offer e-Visits for anyone 68 and older to request care online for non-urgent symptoms. For details visit [email or web address].   Also download the MyChart app! Go to the app store, search "MyChart", open the app, select [place], and log in with your MyChart username and password.

## 2023-12-16 ENCOUNTER — Encounter: Payer: Self-pay | Admitting: Hematology

## 2023-12-17 ENCOUNTER — Inpatient Hospital Stay: Payer: Medicare PPO

## 2023-12-17 ENCOUNTER — Inpatient Hospital Stay: Payer: Medicare PPO | Attending: Hematology

## 2023-12-17 ENCOUNTER — Other Ambulatory Visit: Payer: Self-pay

## 2023-12-17 VITALS — BP 119/58 | HR 60 | Temp 97.9°F | Resp 16 | Wt 150.6 lb

## 2023-12-17 DIAGNOSIS — N189 Chronic kidney disease, unspecified: Secondary | ICD-10-CM | POA: Diagnosis not present

## 2023-12-17 DIAGNOSIS — Z79899 Other long term (current) drug therapy: Secondary | ICD-10-CM | POA: Insufficient documentation

## 2023-12-17 DIAGNOSIS — C9000 Multiple myeloma not having achieved remission: Secondary | ICD-10-CM | POA: Insufficient documentation

## 2023-12-17 DIAGNOSIS — R911 Solitary pulmonary nodule: Secondary | ICD-10-CM | POA: Insufficient documentation

## 2023-12-17 DIAGNOSIS — Z7189 Other specified counseling: Secondary | ICD-10-CM

## 2023-12-17 DIAGNOSIS — Z5112 Encounter for antineoplastic immunotherapy: Secondary | ICD-10-CM | POA: Insufficient documentation

## 2023-12-17 LAB — CBC WITH DIFFERENTIAL (CANCER CENTER ONLY)
Abs Immature Granulocytes: 0.01 10*3/uL (ref 0.00–0.07)
Basophils Absolute: 0 10*3/uL (ref 0.0–0.1)
Basophils Relative: 1 %
Eosinophils Absolute: 0.1 10*3/uL (ref 0.0–0.5)
Eosinophils Relative: 1 %
HCT: 37.8 % (ref 36.0–46.0)
Hemoglobin: 12.5 g/dL (ref 12.0–15.0)
Immature Granulocytes: 0 %
Lymphocytes Relative: 20 %
Lymphs Abs: 0.9 10*3/uL (ref 0.7–4.0)
MCH: 33.3 pg (ref 26.0–34.0)
MCHC: 33.1 g/dL (ref 30.0–36.0)
MCV: 100.8 fL — ABNORMAL HIGH (ref 80.0–100.0)
Monocytes Absolute: 1 10*3/uL (ref 0.1–1.0)
Monocytes Relative: 22 %
Neutro Abs: 2.5 10*3/uL (ref 1.7–7.7)
Neutrophils Relative %: 56 %
Platelet Count: 155 10*3/uL (ref 150–400)
RBC: 3.75 MIL/uL — ABNORMAL LOW (ref 3.87–5.11)
RDW: 13.6 % (ref 11.5–15.5)
WBC Count: 4.5 10*3/uL (ref 4.0–10.5)
nRBC: 0 % (ref 0.0–0.2)

## 2023-12-17 LAB — CMP (CANCER CENTER ONLY)
ALT: 12 U/L (ref 0–44)
AST: 12 U/L — ABNORMAL LOW (ref 15–41)
Albumin: 3.7 g/dL (ref 3.5–5.0)
Alkaline Phosphatase: 85 U/L (ref 38–126)
Anion gap: 6 (ref 5–15)
BUN: 19 mg/dL (ref 8–23)
CO2: 24 mmol/L (ref 22–32)
Calcium: 9.6 mg/dL (ref 8.9–10.3)
Chloride: 108 mmol/L (ref 98–111)
Creatinine: 0.9 mg/dL (ref 0.44–1.00)
GFR, Estimated: >60 mL/min (ref >60)
Glucose, Bld: 179 mg/dL — ABNORMAL HIGH (ref 70–99)
Potassium: 4.1 mmol/L (ref 3.5–5.1)
Sodium: 138 mmol/L (ref 135–145)
Total Bilirubin: 0.6 mg/dL (ref 0.0–1.2)
Total Protein: 5.9 g/dL — ABNORMAL LOW (ref 6.5–8.1)

## 2023-12-17 MED ORDER — ACETAMINOPHEN 325 MG PO TABS
650.0000 mg | ORAL_TABLET | Freq: Once | ORAL | Status: AC
Start: 2023-12-17 — End: 2023-12-17
  Administered 2023-12-17: 650 mg via ORAL
  Filled 2023-12-17: qty 2

## 2023-12-17 MED ORDER — DEXAMETHASONE 4 MG PO TABS
8.0000 mg | ORAL_TABLET | Freq: Once | ORAL | Status: AC
  Administered 2023-12-17: 8 mg via ORAL
  Filled 2023-12-17: qty 2

## 2023-12-17 MED ORDER — SODIUM CHLORIDE 0.9 % IV SOLN
Freq: Once | INTRAVENOUS | Status: AC
Start: 2023-12-17 — End: 2023-12-17

## 2023-12-17 MED ORDER — CARFILZOMIB CHEMO INJECTION 60 MG
36.0000 mg/m2 | Freq: Once | INTRAVENOUS | Status: AC
  Administered 2023-12-17: 60 mg via INTRAVENOUS
  Filled 2023-12-17: qty 30

## 2023-12-17 MED ORDER — OXYCODONE HCL 10 MG PO TABS: 10.0000 mg | ORAL_TABLET | Freq: Four times a day (QID) | ORAL | 0 refills | Status: DC | PRN

## 2023-12-17 MED ORDER — DIPHENHYDRAMINE HCL 25 MG PO CAPS
25.0000 mg | ORAL_CAPSULE | Freq: Once | ORAL | Status: AC
  Administered 2023-12-17: 25 mg via ORAL
  Filled 2023-12-17: qty 1

## 2023-12-17 MED ORDER — PROCHLORPERAZINE MALEATE 10 MG PO TABS
10.0000 mg | ORAL_TABLET | Freq: Once | ORAL | Status: AC
  Administered 2023-12-17: 10 mg via ORAL
  Filled 2023-12-17: qty 1

## 2023-12-17 MED ORDER — HEPARIN SOD (PORK) LOCK FLUSH 100 UNIT/ML IV SOLN: 500.0000 [IU] | Freq: Once | INTRAVENOUS | Status: DC | PRN

## 2023-12-17 MED ORDER — FAMOTIDINE IN NACL 20-0.9 MG/50ML-% IV SOLN
20.0000 mg | Freq: Once | INTRAVENOUS | Status: AC
  Administered 2023-12-17: 20 mg via INTRAVENOUS
  Filled 2023-12-17: qty 50

## 2023-12-17 MED ORDER — SODIUM CHLORIDE 0.9% FLUSH
10.0000 mL | INTRAVENOUS | Status: AC | PRN
Start: 2023-12-17 — End: ?
  Administered 2023-12-17: 10 mL

## 2023-12-17 NOTE — Patient Instructions (Signed)
 [facility]  Discharge Instructions: Thank you for choosing [facility] to provide your oncology and hematology care.   If you have a lab appointment with the Cancer Center, please go directly to the Cancer Center and check in at the registration area.   Wear comfortable clothing and clothing appropriate for easy access to any Portacath or PICC line.   We strive to give you quality time with your provider. You may need to reschedule your appointment if you arrive late (15 or more minutes).  Arriving late affects you and other patients whose appointments are after yours.  Also, if you miss three or more appointments without notifying the office, you may be dismissed from the clinic at the provider's discretion.      For prescription refill requests, have your pharmacy contact our office and allow 72 hours for refills to be completed.    Today you received the following chemotherapy and/or immunotherapy agents: carfilzomib      To help prevent nausea and vomiting after your treatment, we encourage you to take your nausea medication as directed.  BELOW ARE SYMPTOMS THAT SHOULD BE REPORTED IMMEDIATELY: *FEVER GREATER THAN 100.4 F (38 C) OR HIGHER *CHILLS OR SWEATING *NAUSEA AND VOMITING THAT IS NOT CONTROLLED WITH YOUR NAUSEA MEDICATION *UNUSUAL SHORTNESS OF BREATH *UNUSUAL BRUISING OR BLEEDING *URINARY PROBLEMS (pain or burning when urinating, or frequent urination) *BOWEL PROBLEMS (unusual diarrhea, constipation, pain near the anus) TENDERNESS IN MOUTH AND THROAT WITH OR WITHOUT PRESENCE OF ULCERS (sore throat, sores in mouth, or a toothache) UNUSUAL RASH, SWELLING OR PAIN  UNUSUAL VAGINAL DISCHARGE OR ITCHING   Items with * indicate a potential emergency and should be followed up as soon as possible or go to the Emergency Department if any problems should occur.  Please show the CHEMOTHERAPY ALERT CARD or  IMMUNOTHERAPY ALERT CARD at check-in to the Emergency Department and triage nurse.  Should you have questions after your visit or need to cancel or reschedule your appointment, please contact [facility]  Dept: [phone]  and follow the prompts.  Office hours are 8:00 a.m. to 4:30 p.m. Monday - Friday. Please note that voicemails left after 4:00 p.m. may not be returned until the following business day.  We are closed weekends and major holidays. You have access to a nurse at all times for urgent questions. Please call the main number to the clinic Dept: [phone] and follow the prompts.   For any non-urgent questions, you may also contact your provider using MyChart. We now offer e-Visits for anyone 73 and older to request care online for non-urgent symptoms. For details visit [email or web address].   Also download the MyChart app! Go to the app store, search "MyChart", open the app, select [place], and log in with your MyChart username and password.

## 2023-12-17 NOTE — Progress Notes (Signed)
 Patient continues to have unresolved dental issues- no zometa today.

## 2023-12-28 ENCOUNTER — Other Ambulatory Visit: Payer: Self-pay | Admitting: Hematology

## 2023-12-28 DIAGNOSIS — Z7189 Other specified counseling: Secondary | ICD-10-CM

## 2023-12-28 DIAGNOSIS — C9000 Multiple myeloma not having achieved remission: Secondary | ICD-10-CM

## 2023-12-29 NOTE — Progress Notes (Signed)
HEMATOLOGY/ONCOLOGY CLINIC NOTE  Date of Service: 12/31/23   Patient Care Team: Myrlene Broker, MD as PCP - General (Internal Medicine) Hart Carwin, MD (Inactive) as Consulting Physician (Gastroenterology) Jerene Bears, MD as Consulting Physician (Gynecology) Marcene Corning, MD as Consulting Physician (Orthopedic Surgery) Waymon Budge, MD as Consulting Physician (Pulmonary Disease) Mateo Flow, MD (Ophthalmology)  CHIEF COMPLAINTS/PURPOSE OF VISIT:  Follow-up for continued evaluation and management of multiple myeloma  HISTORY OF PRESENTING ILLNESS:  Please see previous notes for details on initial presentation  Current Treatment: Carfilzomib + Revlimid maintenance.  INTERVAL HISTORY:   Faith Orr is a 83 y.o. female who is here for continued evaluation and management of the patient's multiple myeloma. She is here to start cycle 12 day 1 of her treatment.   Patient was last seen by me on 11/19/2023 and reported mild back pain. She reported an episode of sneezing and rhinorrhea, which eventually resolved.   Today, she presents in a wheelchair. Patient reports that after treatment, she has noticed smaller urinary stream. She does stay well hydrated. Patient denies any changes in bowel habits.   Patient reports that starting 2 weeks ago, after standing after using the restroom, she suddenly endorsed worsened lower back pain. Patient reports that her back pain severity has fluctuated in the past. Patient reports a sense of weakness in her right lower extremity, which is new.   Patient complains of lower back pain in the right sacroiliac joint. Bending forward and standing does worsen back pain. Pain is not present in the center or upper back. The pain sometimes radiates to her groin. The pain is tender to touch. She does use a back brace. Patient reports that when she lays flat on her back, her pain temporarily resolves and does not cause sleep  disturbances. She notes that it is painful when moving around in bed. Her back pain limits her ability to apply pressure to her right leg. Patient denies any heavy lifting, falls, or injury to the back. She notes that she recently spent a week at the beach and denies unusually activity. She reports occasional pain in her upper left side. She denies any other new bone pain. Patient inquires whether she may have fractured her pelvis.   Patient reports that she takes Oxycodone every 5 hours. She also uses Tylenol and heat for pain management.   She reports that she does function well at home with additional help.   MEDICAL HISTORY:  Past Medical History:  Diagnosis Date   Allergy    seasonal   Asthma    DEPRESSION    DIABETES MELLITUS, TYPE II    Diverticulosis    HYPERLIPIDEMIA    Macular degeneration of left eye    mild, Dr.Hecker   Obesity, unspecified    Osteoarthritis of both knees    OSTEOPENIA    Osteopenia    URINARY INCONTINENCE     SURGICAL HISTORY: Past Surgical History:  Procedure Laterality Date   CATARACT EXTRACTION Left 05/24/2018   CESAREAN SECTION  01/1973   CYSTOSCOPY/URETEROSCOPY/HOLMIUM LASER/STENT PLACEMENT Right 09/20/2019   Procedure: CYSTOSCOPY/URETEROSCOPY/HOLMIUM LASER/STENT PLACEMENT;  Surgeon: [name], MD;  Location: [facility];  Service: Urology;  Laterality: Right;   FRACTURE SURGERY     IR BONE MARROW BIOPSY & ASPIRATION  02/16/2023   IR IMAGING GUIDED PORT INSERTION  02/20/2019   left wrist surgery  2008   By Dr. Yisroel Ramming   right ankle  1994  SOCIAL HISTORY: Social History   Socioeconomic History   Marital status: Married    Spouse name: Not on file   Number of children: 1   Years of education: Not on file   Highest education level: Not on file  Occupational History    Employer: [employer]  Tobacco Use   Smoking status: Never   Smokeless tobacco: Never   Tobacco comments:    Lives with partner  [name]) and son  Vaping Use   Vaping status: Never Used  Substance and Sexual Activity   Alcohol use: No    Alcohol/week: 0.0 standard drinks of alcohol   Drug use: No   Sexual activity: Never    Partners: Female    Birth control/protection: Post-menopausal    Comment: Lives with female partner (annette hicks) and 48 yo son  Other Topics Concern   Not on file  Social History Narrative   Not on file   Social Drivers of Health   Financial Resource Strain: Low Risk  (09/09/2023)   Overall Financial Resource Strain (CARDIA)    Difficulty of Paying Living Expenses: Not hard at all  Food Insecurity: No Food Insecurity (09/09/2023)   Hunger Vital Sign    Worried About Running Out of Food in the Last Year: Never true    Ran Out of Food in the Last Year: Never true  Transportation Needs: No Transportation Needs (09/09/2023)   PRAPARE - Administrator, Civil Service (Medical): No    Lack of Transportation (Non-Medical): No  Physical Activity: Inactive (09/09/2023)   Exercise Vital Sign    Days of Exercise per Week: 0 days    Minutes of Exercise per Session: 0 min  Stress: No Stress Concern Present (09/09/2023)   [employer] of Occupational Health - Occupational Stress Questionnaire    Feeling of Stress : Not at all  Social Connections: Socially Integrated (09/09/2023)   Social Connection and Isolation Panel [NHANES]    Frequency of Communication with Friends and Family: More than three times a week    Frequency of Social Gatherings with Friends and Family: More than three times a week    Attends Religious Services: More than 4 times per year    Active Member of [employer] or Organizations: Yes    Attends Engineer, structural: More than 4 times per year    Marital Status: Married  Catering manager Violence: Not At Risk (05/08/2021)   Humiliation, Afraid, Rape, and Kick questionnaire    Fear of Current or Ex-Partner: No    Emotionally Abused: No    Physically  Abused: No    Sexually Abused: No    FAMILY HISTORY: Family History  Problem Relation Age of Onset   Diabetes Father    Hyperlipidemia Father    Heart disease Father    Cancer Father    Hypertension Father    Colon cancer Paternal Grandmother 40   Osteoporosis Mother    Protein S deficiency Mother    Hyperlipidemia Mother    Multiple sclerosis Daughter    Cancer Other        bladder   Breast cancer Neg Hx     ALLERGIES:  is allergic to levofloxacin, penicillins, aleve [naproxen sodium], and sulfonamide derivatives.  MEDICATIONS:  Current Outpatient Medications  Medication Sig Dispense Refill   Acetaminophen (TYLENOL PO) Take by mouth as needed.     acyclovir (ZOVIRAX) 400 MG tablet TAKE 1 TABLET(400 MG) BY MOUTH TWICE DAILY 60 tablet 5  Blood Glucose Monitoring Suppl (FREESTYLE FREEDOM LITE) W/DEVICE KIT Use to check blood sugars twice a day Dx 250.00 1 each 0   Calcium Carbonate-Vitamin D 600-400 MG-UNIT tablet Take 1 tablet by mouth 2 (two) times daily.     dexamethasone (DECADRON) 4 MG tablet Take 5 tablets (20 mg total) by mouth once a week. On D22 of each cycle of treatment 20 tablet 5   ELIQUIS 2.5 MG TABS tablet TAKE 1 TABLET(2.5 MG) BY MOUTH TWICE DAILY 60 tablet 2   fentaNYL (DURAGESIC) 12 MCG/HR Place 1 patch onto the skin every 3 (three) days. 10 patch 0   fluticasone (FLONASE) 50 MCG/ACT nasal spray Place 1 spray into both nostrils daily. 48 g 3   glucose blood (FREESTYLE LITE) test strip CHECK BLOOD SUGAR TWICE DAILY AS DIRECTED Dx 250.00 180 each 3   Lancets (FREESTYLE) lancets Use twice daily to check sugars. 100 each 11   lenalidomide (REVLIMID) 5 MG capsule TAKE 1 CAPSULE BY MOUTH DAILY  FOR 21 DAYS, THEN 7 DAYS OFF 21 capsule 0   lidocaine (LIDODERM) 5 % Place 1 patch onto the skin daily. Remove & Discard patch within 12 hours or as directed by MD 30 patch 0   lidocaine-prilocaine (EMLA) cream APPLY 1 APPLICATION TO THE AFFECTED AREA AS NEEDED. USE PRIOR TO  PORT ACCESS 30 g 0   meclizine (ANTIVERT) 25 MG tablet Take 1 tablet (25 mg total) by mouth every 8 (eight) hours as needed for dizziness. 20 tablet 0   metFORMIN (GLUCOPHAGE-XR) 500 MG 24 hr tablet Take 1 tablet (500 mg total) by mouth 2 (two) times daily with a meal. 180 tablet 3   metroNIDAZOLE (METROGEL) 1 % gel Apply topically in the morning and at bedtime. 60 g 1   Multiple Vitamins-Minerals (ICAPS) CAPS Take 1 capsule by mouth daily after breakfast.     ondansetron (ZOFRAN) 8 MG tablet Take 1 tablet (8 mg total) by mouth 2 (two) times daily as needed (Nausea or vomiting). 30 tablet 1   Oxycodone HCl 10 MG TABS Take 1 tablet (10 mg total) by mouth every 6 (six) hours as needed. 90 tablet 0   pantoprazole (PROTONIX) 20 MG tablet TAKE 1 TABLET(20 MG) BY MOUTH DAILY 30 tablet 2   polyethylene glycol (MIRALAX / GLYCOLAX) packet Take 17 g by mouth daily after breakfast.     potassium chloride SA (KLOR-CON M) 20 MEQ tablet Take 2 tablets (40 mEq total) by mouth 2 (two) times daily. 360 tablet 1   prochlorperazine (COMPAZINE) 10 MG tablet Take 1 tablet (10 mg total) by mouth every 6 (six) hours as needed (Nausea or vomiting). 30 tablet 1   senna-docusate (SENNA S) 8.6-50 MG tablet Take 2 tablets by mouth at bedtime. 60 tablet 2   sertraline (ZOLOFT) 100 MG tablet Take 1 tablet (100 mg total) by mouth daily. 90 tablet 3   simvastatin (ZOCOR) 20 MG tablet Take 1 tablet (20 mg total) by mouth daily at 6 PM. 90 tablet 3   Vitamin D, Ergocalciferol, (DRISDOL) 1.25 MG (50000 UNIT) CAPS capsule TAKE 1 CAPSULE BY MOUTH EVERY 7 DAYS 12 capsule 0   No current facility-administered medications for this visit.   Facility-Administered Medications Ordered in Other Visits  Medication Dose Route Frequency Provider Last Rate Last Admin   [MAR Hold] heparin lock flush 100 unit/mL  500 Units Intracatheter Once PRN Johney Maine, MD       sodium chloride flush (NS) 0.9 % injection 10  mL  10 mL Intracatheter  PRN Johney Maine, MD   10 mL at 12/17/23 1336    REVIEW OF SYSTEMS:    10 Point review of Systems was done is negative except as noted above.   PHYSICAL EXAMINATION: ECOG FS:2 - Symptomatic, <50% confined to bed .LMP 10/09/2012    Wt Readings from Last 3 Encounters:  12/17/23 150 lb 9.6 oz (68.3 kg)  12/03/23 151 lb 12 oz (68.8 kg)  11/19/23 150 lb 8 oz (68.3 kg)   There is no height or weight on file to calculate BMI.   GENERAL:alert, in no acute distress and comfortable SKIN: no acute rashes, no significant lesions EYES: conjunctiva are pink and non-injected, sclera anicteric OROPHARYNX: MMM, no exudates, no oropharyngeal erythema or ulceration NECK: supple, no JVD LYMPH:  no palpable lymphadenopathy in the cervical, axillary or inguinal regions LUNGS: clear to auscultation b/l with normal respiratory effort HEART: regular rate & rhythm ABDOMEN:  normoactive bowel sounds , non tender, not distended. Extremity: no pedal edema PSYCH: alert & oriented x 3 with fluent speech NEURO: no focal motor/sensory deficits   LABS     Latest Ref Rng & Units 12/17/2023    1:34 PM 12/03/2023    1:00 PM 11/19/2023   11:10 AM  CBC  WBC 4.0 - 10.5 K/uL 4.5  4.2  3.9   Hemoglobin 12.0 - 15.0 g/dL 29.5  62.1  30.8   Hematocrit 36.0 - 46.0 % 37.8  36.2  37.0   Platelets 150 - 400 K/uL 155  134  180       Latest Ref Rng & Units 12/17/2023    1:34 PM 12/03/2023    1:00 PM 11/19/2023   11:10 AM  CMP  Glucose 70 - 99 mg/dL 657  846  962   BUN 8 - 23 mg/dL 19  18  20    Creatinine 0.44 - 1.00 mg/dL 9.52  8.41  3.24   Sodium 135 - 145 mmol/L 138  140  140   Potassium 3.5 - 5.1 mmol/L 4.1  3.5  3.9   Chloride 98 - 111 mmol/L 108  108  107   CO2 22 - 32 mmol/L 24  23  25    Calcium 8.9 - 10.3 mg/dL 9.6  9.4  40.1   Total Protein 6.5 - 8.1 g/dL 5.9  5.7  5.9   Total Bilirubin 0.0 - 1.2 mg/dL 0.6  0.5  0.6   Alkaline Phos 38 - 126 U/L 85  101  90   AST 15 - 41 U/L 12  13  11    ALT 0 -  44 U/L 12  17  13       09/18/2019 BM Bx Report ([id number])   09/18/2019 FISH Panel    05/30/2019 BM Bx   01/06/2019 BM Bx:     01/06/19 Cytogenetics:      05/30/19 BM Biopsy:   09/18/2019 FISH Panel    09/18/2019 BM Surgical Pathology ([id number])     RADIOGRAPHIC STUDIES: I have personally reviewed the radiological images as listed and agreed with the findings in the report. No results found.   ASSESSMENT & PLAN:   83 y.o. female with  1.  Nonsecretory multiple Myeloma, RISS Stage III  Labs upon initial presentation from 12/08/18, blood counts are normal including WBC at 7.1k, HGB at 13.1, and PLT at 245k. Calcium normal at 10.3. Creatinine normal at 0.63. M spike at 0.5g. 12/13/18 Bone Scan revealed Multifocal uptake throughout the skeleton,  consistent with diffuse metastatic disease. Primary tumor is not specified. 2. Uptake in the proximal right femur, consistent with lytic lesions. 3. Uptake in the ribs bilaterally as described. 4. Lesions in the proximal left humerus. 5. Diffuse uptake throughout the skull consistent with metastatic disease. 6. Right paramedian uptake at the manubrium.  12/13/18 CT Right Femur revealed Numerous lytic lesions involving the right femur and a lytic lesion in the left inferior pubic ramus. Overall appearance is most concerning for multiple myeloma  12/27/18 Pretreatment 24hour UPEP observed an M spike at 18mg , and showed 199mg  total protein/day.  12/27/18 Pretreatment MMP revealed M Protein at 0.5g with IgG Lambda specificity. Kappa:Lambda light chain ratio at 0.13, with Lambda at 40.3. There is less abnormal protein and light chains than I would expect from 30% plasma cells, which suggests hypo-secretory or non-secretory neoplastic plasma cells. Will have an impact in assessing response. 01/05/19 PET/CT revealed Innumerable lytic lesions in the skeleton compatible with myeloma. Most of the larger lesions are hypermetabolic,  for example including a left proximal humeral shaft lesion with maximum SUV of 8.1 and a 2.8 cm lesion in the left T9 vertebral body with maximum SUV 5.1. Most of the smaller lytic lesions, and some of the larger lesions, do not demonstrate accentuated metabolic activity. 2. 1.2 cm in short axis lymph node in the left parapharyngeal space is hypermetabolic with maximum SUV 11.8. I do not see a separate mass in the head and neck to give rise to this hypermetabolic lymph node. 3. Mosaic attenuation in the lower lobes, nonspecific possibly from air trapping. 4.  Aortic Atherosclerosis 5. Heterogeneous activity in the liver, making it hard to exclude small liver lesions. Consider hepatic protocol MRI with and without contrast for definitive assessment. Nonobstructive right nephrolithiasis. Old granulomatous disease  01/06/19 Bone Marrow biopsy revealed interstitial increase in plasma cells (28% aspirate, 40% CD138 immunohistochemistry). Plasma cells negative for light chains consistent with a non or weakly secretory myeloma   01/06/19 Cytogenetics revealed 37% of cells with trisomy 11 or 11q deletion, and 40.5% of cells with 17p mutation  S/p 5 cycles of KRD treatment  05/31/19 BM Biopsy revealed mild atypical plasmacytosis at 5% with polytypic variation.   06/01/19 PET/CT revealed "Dominant lesion in the LEFT humerus is decreased significantly in metabolic activity. Additional hypermetabolic skeletal lytic lesions have decreased in metabolic activity or similar to comparison exam (01/05/2019). No evidence of disease progression. 2. Multiple additional lytic lesions do not have metabolic activity and unchanged. 3. No new skeletal lesions are identified. No soft tissue plasmacytoma identified. 4. Nodule / node in the LEFT parapharyngeal space which is intensely hypermetabolic not changed from prior. 5. New hypermetabolic LEFT lower lobe pulmonary nodule is indeterminate. Recommend close attention on follow-up 6.  New obstructive hydronephrosis of the RIGHT kidney related to RIGHT UPJ stone."  09/18/2019 BM Bx Report which revealed "Slightly hypercellular bone marrow for age with trilineage hematopoiesis and 1% plasma cells."  09/14/2019 PET/CT Whole Body Scan ([id number]) which revealed "1. There widespread tiny lytic lesions compatible with multiple myeloma. Index larger lesions are generally similar to the prior exam, with low-grade activity such as the left T9 vertebral body lesion with maximum SUV 4.5. Is mild increase in the activity associated with a mildly sclerotic left proximal humeral lesion, maximum SUV 4.8 (previously 3.5). 2. At the site of the prior left lower lobe nodule is currently more bandlike thickening, with maximum SUV only 1.9, probably benign, continued surveillance of this region suggested. 3.  There several small but hypermetabolic lymph nodes. This includes a left parapharyngeal space node measuring 1.0 cm with maximum SUV 12.3 (stable); a left level IB lymph node measuring 0.5 cm with maximum SUV 4.8 (slightly larger than prior); and a left inguinal lymph node measuring 0.7 cm in short axis with maximum SUV 6.4 (previously 0.5 cm with maximum SUV 0.6). Significance of these lymph nodes uncertain, surveillance is recommended. 4. New 5 mm left lower lobe subpleural nodule on image 32/8, not appreciably hypermetabolic, surveillance suggested. 5. Focal subcutaneous stranding along the left perineum measuring about 2.6 by 1.1 cm on image 221/4, maximum SUV 12.5. This was not present previously and is most likely inflammatory, although given the notable SUV, surveillance of this region is suggested. 6. Other imaging findings of potential clinical significance: Aortic Atherosclerosis (ICD10-I70.0). Coronary atherosclerosis. Old granulomatous disease. Mild right hydronephrosis due to a 7 mm right UPJ calculus. 2 mm right kidney upper pole nonobstructive renal calculus. Prominent stool throughout the  colon favors constipation."  12/19/2019 Thoracic & Lumbar Spine MRI ([id number]) ([id number]) revealed "Suspected myeloma lesions at T9 and S1. No compression deformity or epidural disease.  01/18/2020 PET/CT ([id number]) which revealed "1. Stable lytic lesions throughout the skeleton. The larger lytic lesions which had mild metabolic activity on comparison exam now have background metabolic activity. No evidence of active myeloma. No evidence of progression multiple myeloma.  No plasmacytoma 3. Hypermetabolic nodules in the LEFT neck may be associated deep tissues of the LEFT parotid gland. 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  01/17/19 MRI Liver revealed Several appreciable liver lesions all have benign imaging characteristics. No MRI findings of metastatic involvement of the liver. 2. Scattered bony lesions corresponding to the lytic lesions seen at PET-CT, compatible with active myeloma. 3. Aortic Atherosclerosis.  Mild cardiomegaly. 4. Diffuse hepatic steatosis.   3. Left lower lobe pulmonary nodule First seen on 06/01/19 PET/CT PET/CT 08/27/2020: No hypermetabolic mediastinal or hilar nodes. No suspicious pulmonary nodules on the CT scan.  4. 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 Being managed conservatively as per patient's preference.  No symptoms from this currently.  5. Mild chronic kidney disease CMP done 04/09/2022 showed creatinine of 1.17 and GFR est of 48 consistent with mild chronic kidney disease.  PLAN:  -Discussed lab results on 12/31/23 in detail with patient. CBC showed WBC of 4.0K, hemoglobin of 12.8, and platelets of 148K. -her PET scan from  two months ago did not show any new concerning findings  -will send patient to ED to receive an MRI of the spine and potentially CT scan of the pelvis if needed due to acute pain limiting her function. Discussed that there is an element of urgency due to motor weakness in legs -possible that patient may have a herniated disc -will hold treatment today  FOLLOW UP: ER for evaluation of severe new lower back pain  The total time spent in the appointment was *** minutes* .  All of the patient's questions were answered with apparent satisfaction. The patient knows to call the clinic with any problems, questions or concerns.   Wyvonnia Lora MD MS AAHIVMS [facility] Hematology/Oncology Physician [facility]  .*Total Encounter Time as defined  by the Centers for Medicare and Medicaid Services includes, in addition to the face-to-face time of a patient visit (documented in the note above) non-face-to-face time: obtaining and reviewing outside history, ordering and reviewing medications, tests or procedures, care coordination (communications with other health care professionals or caregivers) and documentation in the medical record.    I,Mitra Faeizi,acting as a Neurosurgeon for Wyvonnia Lora, MD.,have documented all relevant documentation on the behalf of Wyvonnia Lora, MD,as directed by  Wyvonnia Lora, MD while in the presence of Wyvonnia Lora, MD.  ***

## 2023-12-30 ENCOUNTER — Encounter: Payer: Self-pay | Admitting: Hematology

## 2023-12-31 ENCOUNTER — Inpatient Hospital Stay: Payer: Medicare PPO

## 2023-12-31 ENCOUNTER — Emergency Department (HOSPITAL_COMMUNITY): Payer: Medicare PPO

## 2023-12-31 ENCOUNTER — Emergency Department (HOSPITAL_COMMUNITY)
Admission: EM | Admit: 2023-12-31 | Discharge: 2023-12-31 | Disposition: A | Discharge status: Home / Self Care | Payer: Medicare PPO | Attending: Emergency Medicine | Admitting: Emergency Medicine

## 2023-12-31 ENCOUNTER — Inpatient Hospital Stay: Payer: Medicare PPO | Admitting: Hematology

## 2023-12-31 ENCOUNTER — Other Ambulatory Visit: Payer: Self-pay

## 2023-12-31 VITALS — BP 134/62 | HR 60 | Temp 97.7°F | Resp 18 | Ht 59.0 in

## 2023-12-31 DIAGNOSIS — Z7901 Long term (current) use of anticoagulants: Secondary | ICD-10-CM | POA: Insufficient documentation

## 2023-12-31 DIAGNOSIS — Z5112 Encounter for antineoplastic immunotherapy: Secondary | ICD-10-CM | POA: Diagnosis not present

## 2023-12-31 DIAGNOSIS — M4316 Spondylolisthesis, lumbar region: Secondary | ICD-10-CM | POA: Diagnosis not present

## 2023-12-31 DIAGNOSIS — N189 Chronic kidney disease, unspecified: Secondary | ICD-10-CM | POA: Diagnosis not present

## 2023-12-31 DIAGNOSIS — Z5111 Encounter for antineoplastic chemotherapy: Secondary | ICD-10-CM

## 2023-12-31 DIAGNOSIS — C9000 Multiple myeloma not having achieved remission: Secondary | ICD-10-CM

## 2023-12-31 DIAGNOSIS — M545 Low back pain, unspecified: Secondary | ICD-10-CM | POA: Diagnosis present

## 2023-12-31 DIAGNOSIS — K4020 Bilateral inguinal hernia, without obstruction or gangrene, not specified as recurrent: Secondary | ICD-10-CM | POA: Diagnosis not present

## 2023-12-31 DIAGNOSIS — M48061 Spinal stenosis, lumbar region without neurogenic claudication: Secondary | ICD-10-CM | POA: Diagnosis not present

## 2023-12-31 DIAGNOSIS — R911 Solitary pulmonary nodule: Secondary | ICD-10-CM | POA: Diagnosis not present

## 2023-12-31 DIAGNOSIS — Z79899 Other long term (current) drug therapy: Secondary | ICD-10-CM | POA: Diagnosis not present

## 2023-12-31 DIAGNOSIS — M4807 Spinal stenosis, lumbosacral region: Secondary | ICD-10-CM | POA: Diagnosis not present

## 2023-12-31 DIAGNOSIS — C9001 Multiple myeloma in remission: Secondary | ICD-10-CM

## 2023-12-31 DIAGNOSIS — R935 Abnormal findings on diagnostic imaging of other abdominal regions, including retroperitoneum: Secondary | ICD-10-CM | POA: Diagnosis not present

## 2023-12-31 DIAGNOSIS — R1032 Left lower quadrant pain: Secondary | ICD-10-CM | POA: Insufficient documentation

## 2023-12-31 DIAGNOSIS — M47816 Spondylosis without myelopathy or radiculopathy, lumbar region: Secondary | ICD-10-CM | POA: Diagnosis not present

## 2023-12-31 DIAGNOSIS — N281 Cyst of kidney, acquired: Secondary | ICD-10-CM | POA: Diagnosis not present

## 2023-12-31 DIAGNOSIS — Z7189 Other specified counseling: Secondary | ICD-10-CM

## 2023-12-31 LAB — CMP (CANCER CENTER ONLY)
ALT: 13 U/L (ref 0–44)
AST: 13 U/L — ABNORMAL LOW (ref 15–41)
Albumin: 3.8 g/dL (ref 3.5–5.0)
Alkaline Phosphatase: 88 U/L (ref 38–126)
Anion gap: 5 (ref 5–15)
BUN: 16 mg/dL (ref 8–23)
CO2: 25 mmol/L (ref 22–32)
Calcium: 9.7 mg/dL (ref 8.9–10.3)
Chloride: 109 mmol/L (ref 98–111)
Creatinine: 0.94 mg/dL (ref 0.44–1.00)
GFR, Estimated: >60 mL/min (ref >60)
Glucose, Bld: 166 mg/dL — ABNORMAL HIGH (ref 70–99)
Potassium: 4.2 mmol/L (ref 3.5–5.1)
Sodium: 139 mmol/L (ref 135–145)
Total Bilirubin: 0.7 mg/dL (ref 0.0–1.2)
Total Protein: 5.9 g/dL — ABNORMAL LOW (ref 6.5–8.1)

## 2023-12-31 LAB — CBC WITH DIFFERENTIAL (CANCER CENTER ONLY)
Abs Immature Granulocytes: 0.01 10*3/uL (ref 0.00–0.07)
Basophils Absolute: 0 10*3/uL (ref 0.0–0.1)
Basophils Relative: 1 %
Eosinophils Absolute: 0.2 10*3/uL (ref 0.0–0.5)
Eosinophils Relative: 4 %
HCT: 38.7 % (ref 36.0–46.0)
Hemoglobin: 12.8 g/dL (ref 12.0–15.0)
Immature Granulocytes: 0 %
Lymphocytes Relative: 22 %
Lymphs Abs: 0.9 10*3/uL (ref 0.7–4.0)
MCH: 33.4 pg (ref 26.0–34.0)
MCHC: 33.1 g/dL (ref 30.0–36.0)
MCV: 101 fL — ABNORMAL HIGH (ref 80.0–100.0)
Monocytes Absolute: 1.1 10*3/uL — ABNORMAL HIGH (ref 0.1–1.0)
Monocytes Relative: 26 %
Neutro Abs: 1.9 10*3/uL (ref 1.7–7.7)
Neutrophils Relative %: 47 %
Platelet Count: 148 10*3/uL — ABNORMAL LOW (ref 150–400)
RBC: 3.83 MIL/uL — ABNORMAL LOW (ref 3.87–5.11)
RDW: 13.5 % (ref 11.5–15.5)
WBC Count: 4 10*3/uL (ref 4.0–10.5)
nRBC: 0 % (ref 0.0–0.2)

## 2023-12-31 MED ORDER — GABAPENTIN 100 MG PO CAPS: 100.0000 mg | ORAL_CAPSULE | Freq: Two times a day (BID) | ORAL | 0 refills | Status: AC

## 2023-12-31 MED ORDER — IOHEXOL 300 MG/ML  SOLN
100.0000 mL | Freq: Once | INTRAMUSCULAR | Status: AC | PRN
  Administered 2023-12-31: 100 mL via INTRAVENOUS

## 2023-12-31 MED ORDER — HYDROMORPHONE HCL 1 MG/ML IJ SOLN
0.5000 mg | Freq: Once | INTRAMUSCULAR | Status: AC
  Administered 2023-12-31: 0.5 mg via INTRAVENOUS
  Filled 2023-12-31: qty 1

## 2023-12-31 NOTE — Discharge Instructions (Addendum)
Your CT scan shows you have evidence of Multiple Myeloma in your spine, which appears to be causing your back pain.   Continue your medicines as prescribed.  We are adding a new medicine to see if this will help called gabapentin.  Dr. Clyda Greener office should be calling you early next week.  If you develop worsening, recurrent, or continued back pain, numbness or weakness in the legs, incontinence of your bowels or bladders, numbness of your buttocks, fever, abdominal pain, or any other new/concerning symptoms then return to the ER for evaluation.

## 2023-12-31 NOTE — ED Provider Notes (Signed)
[place] EMERGENCY DEPARTMENT AT [facility] Provider Note   CSN: [id number] Arrival date & time: 12/31/23  1304     History  Chief Complaint  Patient presents with   Back Pain    Faith Orr is a 83 y.o. female.  HPI 83 year old female with a history of multiple myeloma currently being treated presents with severe back pain.  Since Christmas, she has had on and off back pain.  However the pain has become more consistent and severe over the last week, especially last few days.  She has been taking her 10 mg oxycodone without relief.  No direct trauma/fall.  No numbness in her legs though both legs feel weak when she tries to walk.  She is not sure if they are weak or just painful.  No incontinence. No radicular symptoms. No fevers or weight loss.  Was at the cancer center getting treatment and due to the degree of pain she was sent here.  She denies urinary symptoms and states she is gets lower abdominal pain when having bowel movements.  However is found to have abdominal tenderness on exam and states that this is surprising to her, she has not felt recent abdominal symptoms.  Home Medications Prior to Admission medications   Medication Sig Start Date End Date Taking? Authorizing Provider  gabapentin (NEURONTIN) 100 MG capsule Take 1 capsule (100 mg total) by mouth 2 (two) times daily. 12/31/23  Yes Pricilla Loveless, MD  Acetaminophen (TYLENOL PO) Take by mouth as needed.    [provider]  acyclovir (ZOVIRAX) 400 MG tablet TAKE 1 TABLET(400 MG) BY MOUTH TWICE DAILY 12/28/23   Johney Maine, MD  Blood Glucose Monitoring Suppl (FREESTYLE FREEDOM LITE) W/DEVICE KIT Use to check blood sugars twice a day Dx 250.00 06/01/14   Newt Lukes, MD  Calcium Carbonate-Vitamin D 600-400 MG-UNIT tablet Take 1 tablet by mouth 2 (two) times daily.    [provider]  dexamethasone (DECADRON) 4 MG tablet Take 5 tablets (20 mg total) by mouth once a week.  On D22 of each cycle of treatment 03/09/19   Johney Maine, MD  ELIQUIS 2.5 MG TABS tablet TAKE 1 TABLET(2.5 MG) BY MOUTH TWICE DAILY 11/02/23   Johney Maine, MD  fentaNYL (DURAGESIC) 12 MCG/HR Place 1 patch onto the skin every 3 (three) days. 11/17/23   Johney Maine, MD  fluticasone (FLONASE) 50 MCG/ACT nasal spray Place 1 spray into both nostrils daily. 07/23/23   Myrlene Broker, MD  glucose blood (FREESTYLE LITE) test strip CHECK BLOOD SUGAR TWICE DAILY AS DIRECTED Dx 250.00 07/13/14   Newt Lukes, MD  Lancets (FREESTYLE) lancets Use twice daily to check sugars. 04/15/16   Myrlene Broker, MD  lenalidomide (REVLIMID) 5 MG capsule TAKE 1 CAPSULE BY MOUTH DAILY  FOR 21 DAYS, THEN 7 DAYS OFF 11/30/23   Johney Maine, MD  lidocaine (LIDODERM) 5 % Place 1 patch onto the skin daily. Remove & Discard patch within 12 hours or as directed by MD 09/29/23   Johney Maine, MD  lidocaine-prilocaine (EMLA) cream APPLY 1 APPLICATION TO THE AFFECTED AREA AS NEEDED. USE PRIOR TO PORT ACCESS 09/19/21   Johney Maine, MD  meclizine (ANTIVERT) 25 MG tablet Take 1 tablet (25 mg total) by mouth every 8 (eight) hours as needed for dizziness. 07/20/22   Johney Maine, MD  metFORMIN (GLUCOPHAGE-XR) 500 MG 24 hr tablet Take 1 tablet (500 mg total) by  mouth 2 (two) times daily with a meal. 07/23/23   Myrlene Broker, MD  metroNIDAZOLE (METROGEL) 1 % gel Apply topically in the morning and at bedtime. 08/27/23   Johney Maine, MD  Multiple Vitamins-Minerals (ICAPS) CAPS Take 1 capsule by mouth daily after breakfast.    [provider]  ondansetron (ZOFRAN) 8 MG tablet Take 1 tablet (8 mg total) by mouth 2 (two) times daily as needed (Nausea or vomiting). 07/29/23   Johney Maine, MD  Oxycodone HCl 10 MG TABS Take 1 tablet (10 mg total) by mouth every 6 (six) hours as needed. 12/17/23   Jaci Standard, MD  pantoprazole (PROTONIX) 20 MG  tablet TAKE 1 TABLET(20 MG) BY MOUTH DAILY 07/29/23   Johney Maine, MD  polyethylene glycol [facility] / GLYCOLAX) packet Take 17 g by mouth daily after breakfast.    [provider]  potassium chloride SA (KLOR-CON M) 20 MEQ tablet Take 2 tablets (40 mEq total) by mouth 2 (two) times daily. 07/29/23   Johney Maine, MD  prochlorperazine (COMPAZINE) 10 MG tablet Take 1 tablet (10 mg total) by mouth every 6 (six) hours as needed (Nausea or vomiting). 01/16/19   Johney Maine, MD  senna-docusate (SENNA S) 8.6-50 MG tablet Take 2 tablets by mouth at bedtime. 03/26/20   Johney Maine, MD  sertraline (ZOLOFT) 100 MG tablet Take 1 tablet (100 mg total) by mouth daily. 07/23/23   Myrlene Broker, MD  simvastatin (ZOCOR) 20 MG tablet Take 1 tablet (20 mg total) by mouth daily at 6 PM. 07/23/23   Myrlene Broker, MD  Vitamin D, Ergocalciferol, (DRISDOL) 1.25 MG (50000 UNIT) CAPS capsule TAKE 1 CAPSULE BY MOUTH EVERY 7 DAYS 11/18/23   Johney Maine, MD      Allergies    Levofloxacin, Penicillins, Aleve [naproxen sodium], and Sulfonamide derivatives    Review of Systems   Review of Systems  Constitutional:  Negative for fever.  Gastrointestinal:  Positive for abdominal pain (when having bowel movements).  Genitourinary:  Negative for dysuria.  Musculoskeletal:  Positive for back pain.  Neurological:  Negative for numbness.    Physical Exam Updated Vital Signs BP 118/72 (BP Location: Right Arm)   Pulse 60   Temp 98.6 F (37 C) (Oral)   Resp 16   LMP 10/09/2012   SpO2 97%  Physical Exam Vitals and nursing note reviewed.  Constitutional:      Appearance: She is well-developed. She is not diaphoretic.  HENT:     Head: Normocephalic and atraumatic.  Cardiovascular:     Rate and Rhythm: Normal rate and regular rhythm.     Heart sounds: Normal heart sounds.  Pulmonary:     Effort: Pulmonary effort is normal.     Breath sounds: Normal breath sounds.   Abdominal:     Palpations: Abdomen is soft.     Tenderness: There is abdominal tenderness (mild) in the left lower quadrant.  Musculoskeletal:     Thoracic back: No tenderness or bony tenderness.     Lumbar back: Tenderness and bony tenderness present.       Back:  Skin:    General: Skin is warm and dry.  Neurological:     Mental Status: She is alert.     Comments: 5/5 strength in BLE. Grossly normal sensation     ED Results / Procedures / Treatments   Labs (all labs ordered are listed, but only abnormal results are displayed)  Labs Reviewed - No data to display  EKG None  Radiology CT ABDOMEN PELVIS W CONTRAST Result Date: 12/31/2023 CLINICAL DATA:  Left lower quadrant abdominal pain. No acute abnormality. History of multiple myeloma. EXAM: CT ABDOMEN AND PELVIS WITH CONTRAST TECHNIQUE: Multidetector CT imaging of the abdomen and pelvis was performed using the standard protocol following bolus administration of intravenous contrast. RADIATION DOSE REDUCTION: This exam was performed according to the departmental dose-optimization program which includes automated exposure control, adjustment of the mA and/or kV according to patient size and/or use of iterative reconstruction technique. CONTRAST:  OMNIPAQUE IOHEXOL 300 MG/ML  SOLN COMPARISON:  PET-CT from 10/14/2023 FINDINGS: Lower chest: No acute abnormality. Hepatobiliary: Unchanged cyst along the posterior dome of right lobe measuring 1.2 cm. Peripheral, wedge-shaped area of low density within the posterolateral right lobe measures 2.0 by 1.8 cm and is unchanged when compared with 07/18/2020 compatible with a benign abnormality. Previously characterized as either scar or atypical hemangioma. Gallbladder appears normal. No bile duct dilatation. Pancreas: Unremarkable. No pancreatic ductal dilatation or surrounding inflammatory changes. Spleen: Calcified granulomas within the spleen. Adrenals/Urinary Tract: Normal adrenal glands.  Small bilateral kidney cysts measuring less than 1 cm and technically too small to characterize. These are compatible with benign Bosniak class 2 cyst requiring no further follow-up. No obstructive uropathy. Urinary bladder is normal. Stomach/Bowel: Stomach is normal. The appendix is visualized and appears normal. No pathologic dilatation of the large or small bowel loops. Hyperdense material identified within left abdominal small bowel loops. Most likely this represents ingested material such as Pepto-Bismol or enteric contrast material which may be hyperdense on CT imaging. This may also be seen with intraluminal extravasation intravenous contrast material due to active GI bleeding bleeding. Clinical correlation advised. Vascular/Lymphatic: Aortic atherosclerosis. No aneurysm. The upper abdominal vascularity appears patent. No signs of abdominopelvic adenopathy. Reproductive: Uterus and bilateral adnexa are unremarkable. Other: No free fluid or fluid collections. Bilateral fat containing inguinal hernias. Musculoskeletal: No acute abnormality. Lumbar spondylosis. Multiple scattered lucent lesions of multiple myeloma are again noted. See lumbar CT report from today for further details regarding the lumbar spine. IMPRESSION: 1. No acute findings within the abdomen or pelvis to explain patient's left lower quadrant pain. 2. Hyperdense material identified within left abdominal small bowel loops. Most likely this represents ingested material such as Pepto-Bismol or enteric contrast material which may be hyperdense on CT imaging. In the absence of any clinical signs or symptoms of GI bleeding this is unlikely to represent intraluminal extravasation of IV contrast material. Clinical correlation advised. 3. Multiple scattered lucent lesions of multiple myeloma are again noted. 4.  Aortic Atherosclerosis (ICD10-I70.0). Electronically Signed   By: Signa Kell M.D.   On: 12/31/2023 16:35   CT L-SPINE NO CHARGE Result  Date: 12/31/2023 CLINICAL DATA:  Provided history: Severe low back pain EXAM: CT LUMBAR SPINE WITHOUT CONTRAST TECHNIQUE: Multidetector CT imaging of the lumbar spine was performed without intravenous contrast administration. Multiplanar CT image reconstructions were also generated. RADIATION DOSE REDUCTION: This exam was performed according to the departmental dose-optimization program which includes automated exposure control, adjustment of the mA and/or kV according to patient size and/or use of iterative reconstruction technique. COMPARISON:  Lumbar spine MRI 01/15/2021. MRI sacrum 01/15/2021. PET CT 10/14/2023. FINDINGS: Segmentation: 5 lumbar vertebrae. The caudal most well-formed intervertebral disc space is designated L5-S1. Alignment: 3 mm L4-L5 grade 1 anterolisthesis. Vertebrae: Lumbar vertebral body height is maintained. There are scattered lucent osseous lesions within the lumbar spine, sacrum, visualized  iliac bones and visualized lower thoracic spine. The largest lesions are located within the right sacral ala (measuring 15 mm), within the S1 spinous process (measuring 16 mm), within the right iliac bone (15 mm) and within the right aspect of the L3 vertebral body (12 mm). No evidence of a lumbar spine fracture. Multilevel ventrolateral osteophytes. Paraspinal and other soft tissues: No paraspinal mass or collection. Please refer to the concurrently performed CT abdomen/pelvis for description of abdominopelvic soft tissue findings. Disc levels: Multilevel disc space narrowing, greatest at L4-L5 (moderate to advanced) and L5-S1 (advanced). T12-L1: Small disc bulge. No evidence of significant spinal canal stenosis or neural foraminal narrowing. L1-L2: No evidence of a significant disc herniation, spinal canal stenosis or neural foraminal narrowing. L2-L3: Disc bulge. Bilateral subarticular narrowing. Mild central canal stenosis. No evidence of significant foraminal stenosis. L3-L4: Disc bulge. Facet  arthropathy. Mild bilateral subarticular narrowing suspected. No significant central canal stenosis is appreciated. No significant neural foraminal narrowing. L4-L5: 3 mm grade 1 anterolisthesis. Disc uncovering with disc bulge. Advanced facet arthropathy. Ligamentum flavum hypertrophy. Bilateral subarticular stenosis. At least moderate central canal stenosis. Bilateral neural foraminal narrowing (moderate/severe right, moderate left). L5-S1: Disc bulge with endplate osteophytes. Facet arthropathy (predominantly on the right). No evidence of significant spinal canal stenosis. Bilateral neural foraminal narrowing (moderate/severe right, moderate left). IMPRESSION: 1. Scattered lucent osseous lesions within the lumbar spine, sacrum, visualized iliac bones and visualized lower thoracic spine consistent with the history of multiple myeloma. No evidence of a lumbar spine fracture. 2. Lumbar spondylosis as outlined within the body of the report. 3. At L4-L5, there is 3 mm facet-mediated grade 1 anterolisthesis. Moderate-to-advanced disc space narrowing. Multifactorial bilateral subarticular stenosis and at least moderate central canal stenosis. A lumbar spine MRI may be obtained for further evaluation, as clinically warranted. Bilateral neural foraminal narrowing (moderate/severe right, moderate left). 4. At L5-S1, there is advanced disc space narrowing. Multifactorial bilateral neural foraminal narrowing (moderate/severe right, moderate left). 5. At L2-L3, a disc bulge results in bilateral subarticular and mild central canal narrowing. 6. No significant central canal stenosis, and no more than mild subarticular narrowing, appreciated at the remaining lumbar levels. Electronically Signed   By: Jackey Loge D.O.   On: 12/31/2023 15:57    Procedures Procedures    Medications Ordered in ED Medications  HYDROmorphone (DILAUDID) injection 0.5 mg (0.5 mg Intravenous Given 12/31/23 1355)  iohexol (OMNIPAQUE) 300 MG/ML  solution 100 mL (100 mLs Intravenous Contrast Given 12/31/23 1455)    ED Course/ Medical Decision Making/ A&P                                 Medical Decision Making Amount and/or Complexity of Data Reviewed Radiology: ordered.    Details: Lesions in her L-spine consistent with multiple myeloma.  Risk Prescription drug management.   Patient is feeling a lot better after a dose of Dilaudid.  She feels well enough from a pain control perspective to go home.  She is taking oxycodone 10 mg every 6 hours and will try and bump this up to every 4 hours for now.  I discussed her results with Dr. Mosetta Putt, these lesions from multiple myeloma would be painful and are likely the cause.  She has no neurosymptoms to suggest that there is a spinal cord component so I do not think an MRI today is needed.  She did suggest we can give some gabapentin to see if  that will help.  She will otherwise arrange early follow-up as the patient will probably need radiation therapy.  Otherwise, patient's lab work was unremarkable in the office today.  The CT of the abdomen pelvis does not show any acute emergent condition, and is unclear what this hyperdense material is in her abdominal loops.  Her abdominal pain is better and she has not had any signs or symptoms of GI bleeding.  I think she is stable for follow-up with her PCP.  Will give return precautions.        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

## 2023-12-31 NOTE — ED Triage Notes (Signed)
Pt brought over from cancer center. C/o severe lower R back pain. Pt can not move d/t pain. Pain began around xmas and has been getting worse.  Hx mult myeloma.  AOx4

## 2023-12-31 NOTE — ED Notes (Signed)
Pt to CT

## 2024-01-03 ENCOUNTER — Encounter: Payer: Self-pay | Admitting: Hematology

## 2024-01-03 ENCOUNTER — Other Ambulatory Visit: Payer: Self-pay | Admitting: Hematology

## 2024-01-03 DIAGNOSIS — C9000 Multiple myeloma not having achieved remission: Secondary | ICD-10-CM

## 2024-01-05 ENCOUNTER — Other Ambulatory Visit: Payer: Self-pay

## 2024-01-05 ENCOUNTER — Encounter: Payer: Self-pay | Admitting: Hematology

## 2024-01-05 DIAGNOSIS — C9000 Multiple myeloma not having achieved remission: Secondary | ICD-10-CM

## 2024-01-07 ENCOUNTER — Other Ambulatory Visit: Payer: Self-pay | Admitting: Hematology

## 2024-01-07 ENCOUNTER — Other Ambulatory Visit: Payer: Self-pay

## 2024-01-07 ENCOUNTER — Encounter: Payer: Self-pay | Admitting: Hematology

## 2024-01-07 DIAGNOSIS — M549 Dorsalgia, unspecified: Secondary | ICD-10-CM

## 2024-01-07 DIAGNOSIS — C9000 Multiple myeloma not having achieved remission: Secondary | ICD-10-CM

## 2024-01-07 MED ORDER — OXYCODONE HCL 10 MG PO TABS: 10.0000 mg | ORAL_TABLET | Freq: Four times a day (QID) | ORAL | 0 refills | Status: DC | PRN

## 2024-01-07 MED ORDER — DEXAMETHASONE 4 MG PO TABS: ORAL_TABLET | ORAL | 0 refills | Status: AC

## 2024-01-07 NOTE — Progress Notes (Signed)
Contacted pt regarding Mychart message. Updated pt that Dr Candise Che has ordered a PET to evaluate any changes . Pt instructed to take dexamethasone for 2 weeks and how to take/ taper dose. Pt verbalized understanding of medication instructions.

## 2024-01-12 NOTE — Progress Notes (Incomplete)
HEMATOLOGY/ONCOLOGY CLINIC NOTE  Date of Service: 01/14/2024   Patient Care Team: Myrlene Broker, MD as PCP - General (Internal Medicine) Hart Carwin, MD (Inactive) as Consulting Physician (Gastroenterology) Jerene Bears, MD as Consulting Physician (Gynecology) Marcene Corning, MD as Consulting Physician (Orthopedic Surgery) Waymon Budge, MD as Consulting Physician (Pulmonary Disease) Mateo Flow, MD (Ophthalmology)  CHIEF COMPLAINTS/PURPOSE OF VISIT:  Follow-up for continued evaluation and management of multiple myeloma  HISTORY OF PRESENTING ILLNESS:  Please see previous notes for details on initial presentation  Current Treatment: Carfilzomib + Revlimid maintenance.  INTERVAL HISTORY:   Faith Orr is a 83 y.o. female who is here for continued evaluation and management of the patient's multiple myeloma. She is here to start cycle 12 day 15 of her treatment.   Patient was last seen by me on 12/31/2023 and reported smaller urinary stream, suddenly worsened lower back pain in right sacroiliac joint, sense of weakness in her right lower extremity, and occasional pain in her upper left side.  She presented to the ED on 12/31/2023 for back pain.   Today, she presents with a walker. She reports that her back pain has improved overall. Patient's back pain was noted to be mostly in her central back over the sacrum and right sacroiliac joint. She denies any other areas of bone pain that are new or different.   To manage her pain, she reports that she begins by taking her pain medication in the mornings, then waiting 1-1.5 hours before using a heating pad, which controls her pain fairly well untils she is needing her next dose of pain medication. Patient reports that using a SI brace sometimes worsens pain and she has not used it since the weekend. Patient has not tried using a lidocaine patch. She is not using any muscle relaxants. Patient does use a short course  of dexamethasone and reports that she takes Tylenol with Dexamethasone. Patient takes 2 extra-strength Tylenol every 4-6 hours. She uses 15 MG Oxycodone every 4 hours. She has been using a Fentanyl patch every 3 days. Her pain does improve with pain medication, though she does still experience a constant dull ache, which is not sharp. Patient notes that she has tried lowering her use of pain medications previously, which caused bothersome back pain. She notes that she is needing to move around slowly as she is weary of her back pain.   She reports that she has been very confused recently.   She reports that when lying down flat, certain positions are painful. Patinet  reports normal sleeping habits otherwise.   She reports that family and transportation issues have caused her to delay her PET scan.   Patient reports that she has not been evaluated by orthopedics in over 10 years.  She takes Metformin twice a day.   MEDICAL HISTORY:  Past Medical History:  Diagnosis Date   Allergy    seasonal   Asthma    DEPRESSION    DIABETES MELLITUS, TYPE II    Diverticulosis    HYPERLIPIDEMIA    Macular degeneration of left eye    mild, Dr.Hecker   Obesity, unspecified    Osteoarthritis of both knees    OSTEOPENIA    Osteopenia    URINARY INCONTINENCE     SURGICAL HISTORY: Past Surgical History:  Procedure Laterality Date   CATARACT EXTRACTION Left 05/24/2018   CESAREAN SECTION  01/1973   CYSTOSCOPY/URETEROSCOPY/HOLMIUM LASER/STENT PLACEMENT Right 09/20/2019   Procedure: CYSTOSCOPY/URETEROSCOPY/HOLMIUM LASER/STENT  PLACEMENT;  Surgeon: [name], MD;  Location: [facility];  Service: Urology;  Laterality: Right;   FRACTURE SURGERY     IR BONE MARROW BIOPSY & ASPIRATION  02/16/2023   IR IMAGING GUIDED PORT INSERTION  02/20/2019   left wrist surgery  2008   By Dr. Yisroel Ramming   right ankle  1994    SOCIAL HISTORY: Social History   Socioeconomic History   Marital status:  Married    Spouse name: Not on file   Number of children: 1   Years of education: Not on file   Highest education level: Not on file  Occupational History    Employer: [employer]  Tobacco Use   Smoking status: Never   Smokeless tobacco: Never   Tobacco comments:    Lives with partner [name]) and son  Vaping Use   Vaping status: Never Used  Substance and Sexual Activity   Alcohol use: No    Alcohol/week: 0.0 standard drinks of alcohol   Drug use: No   Sexual activity: Never    Partners: Female    Birth control/protection: Post-menopausal    Comment: Lives with female partner (annette hicks) and 81 yo son  Other Topics Concern   Not on file  Social History Narrative   Not on file   Social Drivers of Health   Financial Resource Strain: Low Risk  (09/09/2023)   Overall Financial Resource Strain (CARDIA)    Difficulty of Paying Living Expenses: Not hard at all  Food Insecurity: No Food Insecurity (09/09/2023)   Hunger Vital Sign    Worried About Running Out of Food in the Last Year: Never true    Ran Out of Food in the Last Year: Never true  Transportation Needs: No Transportation Needs (09/09/2023)   PRAPARE - Administrator, Civil Service (Medical): No    Lack of Transportation (Non-Medical): No  Physical Activity: Inactive (09/09/2023)   Exercise Vital Sign    Days of Exercise per Week: 0 days    Minutes of Exercise per Session: 0 min  Stress: No Stress Concern Present (09/09/2023)   [employer] of Occupational Health - Occupational Stress Questionnaire    Feeling of Stress : Not at all  Social Connections: Socially Integrated (09/09/2023)   Social Connection and Isolation Panel [NHANES]    Frequency of Communication with Friends and Family: More than three times a week    Frequency of Social Gatherings with Friends and Family: More than three times a week    Attends Religious Services: More than 4 times per year    Active Member of  [employer] or Organizations: Yes    Attends Engineer, structural: More than 4 times per year    Marital Status: Married  Catering manager Violence: Not At Risk (05/08/2021)   Humiliation, Afraid, Rape, and Kick questionnaire    Fear of Current or Ex-Partner: No    Emotionally Abused: No    Physically Abused: No    Sexually Abused: No    FAMILY HISTORY: Family History  Problem Relation Age of Onset   Diabetes Father    Hyperlipidemia Father    Heart disease Father    Cancer Father    Hypertension Father    Colon cancer Paternal Grandmother 13   Osteoporosis Mother    Protein S deficiency Mother    Hyperlipidemia Mother    Multiple sclerosis Daughter    Cancer Other  bladder   Breast cancer Neg Hx     ALLERGIES:  is allergic to levofloxacin, penicillins, aleve [naproxen sodium], and sulfonamide derivatives.  MEDICATIONS:  Current Outpatient Medications  Medication Sig Dispense Refill   Acetaminophen (TYLENOL PO) Take by mouth as needed.     acyclovir (ZOVIRAX) 400 MG tablet TAKE 1 TABLET(400 MG) BY MOUTH TWICE DAILY 60 tablet 5   Blood Glucose Monitoring Suppl (FREESTYLE FREEDOM LITE) W/DEVICE KIT Use to check blood sugars twice a day Dx 250.00 1 each 0   Calcium Carbonate-Vitamin D 600-400 MG-UNIT tablet Take 1 tablet by mouth 2 (two) times daily.     dexamethasone (DECADRON) 4 MG tablet Take 5 tablets (20 mg total) by mouth once a week. On D22 of each cycle of treatment 20 tablet 5   dexamethasone (DECADRON) 4 MG tablet Take 1 tablet (4 mg total) by mouth 2 (two) times daily with a meal for 7 days, THEN 1 tablet (4 mg total) daily for 7 days. 21 tablet 0   ELIQUIS 2.5 MG TABS tablet TAKE 1 TABLET(2.5 MG) BY MOUTH TWICE DAILY 60 tablet 2   fentaNYL (DURAGESIC) 12 MCG/HR Place 1 patch onto the skin every 3 (three) days. 10 patch 0   fluticasone (FLONASE) 50 MCG/ACT nasal spray Place 1 spray into both nostrils daily. 48 g 3   gabapentin (NEURONTIN) 100 MG capsule  Take 1 capsule (100 mg total) by mouth 2 (two) times daily. 60 capsule 0   glucose blood (FREESTYLE LITE) test strip CHECK BLOOD SUGAR TWICE DAILY AS DIRECTED Dx 250.00 180 each 3   Lancets (FREESTYLE) lancets Use twice daily to check sugars. 100 each 11   lenalidomide (REVLIMID) 5 MG capsule TAKE 1 CAPSULE BY MOUTH DAILY  FOR 21 DAYS, THEN 7 DAYS OFF 21 capsule 0   lidocaine (LIDODERM) 5 % Place 1 patch onto the skin daily. Remove & Discard patch within 12 hours or as directed by MD 30 patch 0   lidocaine-prilocaine (EMLA) cream APPLY 1 APPLICATION TO THE AFFECTED AREA AS NEEDED. USE PRIOR TO PORT ACCESS 30 g 0   meclizine (ANTIVERT) 25 MG tablet Take 1 tablet (25 mg total) by mouth every 8 (eight) hours as needed for dizziness. 20 tablet 0   metFORMIN (GLUCOPHAGE-XR) 500 MG 24 hr tablet Take 1 tablet (500 mg total) by mouth 2 (two) times daily with a meal. 180 tablet 3   metroNIDAZOLE (METROGEL) 1 % gel Apply topically in the morning and at bedtime. 60 g 1   Multiple Vitamins-Minerals (ICAPS) CAPS Take 1 capsule by mouth daily after breakfast.     ondansetron (ZOFRAN) 8 MG tablet Take 1 tablet (8 mg total) by mouth 2 (two) times daily as needed (Nausea or vomiting). 30 tablet 1   Oxycodone HCl 10 MG TABS Take 1 tablet (10 mg total) by mouth every 6 (six) hours as needed. 90 tablet 0   pantoprazole (PROTONIX) 20 MG tablet TAKE 1 TABLET(20 MG) BY MOUTH DAILY 30 tablet 2   polyethylene glycol (MIRALAX / GLYCOLAX) packet Take 17 g by mouth daily after breakfast.     potassium chloride SA (KLOR-CON M) 20 MEQ tablet Take 2 tablets (40 mEq total) by mouth 2 (two) times daily. 360 tablet 1   prochlorperazine (COMPAZINE) 10 MG tablet Take 1 tablet (10 mg total) by mouth every 6 (six) hours as needed (Nausea or vomiting). 30 tablet 1   senna-docusate (SENNA S) 8.6-50 MG tablet Take 2 tablets by mouth at bedtime.  60 tablet 2   sertraline (ZOLOFT) 100 MG tablet Take 1 tablet (100 mg total) by mouth daily. 90  tablet 3   simvastatin (ZOCOR) 20 MG tablet Take 1 tablet (20 mg total) by mouth daily at 6 PM. 90 tablet 3   Vitamin D, Ergocalciferol, (DRISDOL) 1.25 MG (50000 UNIT) CAPS capsule TAKE 1 CAPSULE BY MOUTH EVERY 7 DAYS 12 capsule 0   No current facility-administered medications for this visit.   Facility-Administered Medications Ordered in Other Visits  Medication Dose Route Frequency Provider Last Rate Last Admin   [MAR Hold] heparin lock flush 100 unit/mL  500 Units Intracatheter Once PRN Johney Maine, MD       sodium chloride flush (NS) 0.9 % injection 10 mL  10 mL Intracatheter PRN Johney Maine, MD   10 mL at 12/17/23 1336    REVIEW OF SYSTEMS:    10 Point review of Systems was done is negative except as noted above.   PHYSICAL EXAMINATION: ECOG FS:2 - Symptomatic, <50% confined to bed .LMP 10/09/2012    GENERAL:alert, in no acute distress and comfortable SKIN: no acute rashes, no significant lesions EYES: conjunctiva are pink and non-injected, sclera anicteric OROPHARYNX: MMM, no exudates, no oropharyngeal erythema or ulceration NECK: supple, no JVD LYMPH:  no palpable lymphadenopathy in the cervical, axillary or inguinal regions LUNGS: clear to auscultation b/l with normal respiratory effort HEART: regular rate & rhythm ABDOMEN:  normoactive bowel sounds , non tender, not distended. Extremity: no pedal edema PSYCH: alert & oriented x 3 with fluent speech NEURO: no focal motor/sensory deficits   LABS .    Latest Ref Rng & Units 12/31/2023   11:44 AM 12/17/2023    1:34 PM 12/03/2023    1:00 PM  CBC  WBC 4.0 - 10.5 K/uL 4.0  4.5  4.2   Hemoglobin 12.0 - 15.0 g/dL 16.1  09.6  04.5   Hematocrit 36.0 - 46.0 % 38.7  37.8  36.2   Platelets 150 - 400 K/uL 148  155  134    .    Latest Ref Rng & Units 12/31/2023   11:44 AM 12/17/2023    1:34 PM 12/03/2023    1:00 PM  CMP  Glucose 70 - 99 mg/dL 409  811  914   BUN 8 - 23 mg/dL 16  19  18    Creatinine 0.44 -  1.00 mg/dL 7.82  9.56  2.13   Sodium 135 - 145 mmol/L 139  138  140   Potassium 3.5 - 5.1 mmol/L 4.2  4.1  3.5   Chloride 98 - 111 mmol/L 109  108  108   CO2 22 - 32 mmol/L 25  24  23    Calcium 8.9 - 10.3 mg/dL 9.7  9.6  9.4   Total Protein 6.5 - 8.1 g/dL 5.9  5.9  5.7   Total Bilirubin 0.0 - 1.2 mg/dL 0.7  0.6  0.5   Alkaline Phos 38 - 126 U/L 88  85  101   AST 15 - 41 U/L 13  12  13    ALT 0 - 44 U/L 13  12  17       09/18/2019 BM Bx Report ([id number])   09/18/2019 FISH Panel    05/30/2019 BM Bx   01/06/2019 BM Bx:     01/06/19 Cytogenetics:      05/30/19 BM Biopsy:   09/18/2019 FISH Panel    09/18/2019 BM Surgical Pathology [id number])     RADIOGRAPHIC STUDIES:  I have personally reviewed the radiological images as listed and agreed with the findings in the report. CT ABDOMEN PELVIS W CONTRAST Result Date: 12/31/2023 CLINICAL DATA:  Left lower quadrant abdominal pain. No acute abnormality. History of multiple myeloma. EXAM: CT ABDOMEN AND PELVIS WITH CONTRAST TECHNIQUE: Multidetector CT imaging of the abdomen and pelvis was performed using the standard protocol following bolus administration of intravenous contrast. RADIATION DOSE REDUCTION: This exam was performed according to the departmental dose-optimization program which includes automated exposure control, adjustment of the mA and/or kV according to patient size and/or use of iterative reconstruction technique. CONTRAST:  OMNIPAQUE IOHEXOL 300 MG/ML  SOLN COMPARISON:  PET-CT from 10/14/2023 FINDINGS: Lower chest: No acute abnormality. Hepatobiliary: Unchanged cyst along the posterior dome of right lobe measuring 1.2 cm. Peripheral, wedge-shaped area of low density within the posterolateral right lobe measures 2.0 by 1.8 cm and is unchanged when compared with 07/18/2020 compatible with a benign abnormality. Previously characterized as either scar or atypical hemangioma. Gallbladder appears normal. No  bile duct dilatation. Pancreas: Unremarkable. No pancreatic ductal dilatation or surrounding inflammatory changes. Spleen: Calcified granulomas within the spleen. Adrenals/Urinary Tract: Normal adrenal glands. Small bilateral kidney cysts measuring less than 1 cm and technically too small to characterize. These are compatible with benign Bosniak class 2 cyst requiring no further follow-up. No obstructive uropathy. Urinary bladder is normal. Stomach/Bowel: Stomach is normal. The appendix is visualized and appears normal. No pathologic dilatation of the large or small bowel loops. Hyperdense material identified within left abdominal small bowel loops. Most likely this represents ingested material such as Pepto-Bismol or enteric contrast material which may be hyperdense on CT imaging. This may also be seen with intraluminal extravasation intravenous contrast material due to active GI bleeding bleeding. Clinical correlation advised. Vascular/Lymphatic: Aortic atherosclerosis. No aneurysm. The upper abdominal vascularity appears patent. No signs of abdominopelvic adenopathy. Reproductive: Uterus and bilateral adnexa are unremarkable. Other: No free fluid or fluid collections. Bilateral fat containing inguinal hernias. Musculoskeletal: No acute abnormality. Lumbar spondylosis. Multiple scattered lucent lesions of multiple myeloma are again noted. See lumbar CT report from today for further details regarding the lumbar spine. IMPRESSION: 1. No acute findings within the abdomen or pelvis to explain patient's left lower quadrant pain. 2. Hyperdense material identified within left abdominal small bowel loops. Most likely this represents ingested material such as Pepto-Bismol or enteric contrast material which may be hyperdense on CT imaging. In the absence of any clinical signs or symptoms of GI bleeding this is unlikely to represent intraluminal extravasation of IV contrast material. Clinical correlation advised. 3. Multiple  scattered lucent lesions of multiple myeloma are again noted. 4.  Aortic Atherosclerosis (ICD10-I70.0). Electronically Signed   By: Signa Kell M.D.   On: 12/31/2023 16:35   CT L-SPINE NO CHARGE Result Date: 12/31/2023 CLINICAL DATA:  Provided history: Severe low back pain EXAM: CT LUMBAR SPINE WITHOUT CONTRAST TECHNIQUE: Multidetector CT imaging of the lumbar spine was performed without intravenous contrast administration. Multiplanar CT image reconstructions were also generated. RADIATION DOSE REDUCTION: This exam was performed according to the departmental dose-optimization program which includes automated exposure control, adjustment of the mA and/or kV according to patient size and/or use of iterative reconstruction technique. COMPARISON:  Lumbar spine MRI 01/15/2021. MRI sacrum 01/15/2021. PET CT 10/14/2023. FINDINGS: Segmentation: 5 lumbar vertebrae. The caudal most well-formed intervertebral disc space is designated L5-S1. Alignment: 3 mm L4-L5 grade 1 anterolisthesis. Vertebrae: Lumbar vertebral body height is maintained. There are scattered lucent osseous lesions within  the lumbar spine, sacrum, visualized iliac bones and visualized lower thoracic spine. The largest lesions are located within the right sacral ala (measuring 15 mm), within the S1 spinous process (measuring 16 mm), within the right iliac bone (15 mm) and within the right aspect of the L3 vertebral body (12 mm). No evidence of a lumbar spine fracture. Multilevel ventrolateral osteophytes. Paraspinal and other soft tissues: No paraspinal mass or collection. Please refer to the concurrently performed CT abdomen/pelvis for description of abdominopelvic soft tissue findings. Disc levels: Multilevel disc space narrowing, greatest at L4-L5 (moderate to advanced) and L5-S1 (advanced). T12-L1: Small disc bulge. No evidence of significant spinal canal stenosis or neural foraminal narrowing. L1-L2: No evidence of a significant disc herniation,  spinal canal stenosis or neural foraminal narrowing. L2-L3: Disc bulge. Bilateral subarticular narrowing. Mild central canal stenosis. No evidence of significant foraminal stenosis. L3-L4: Disc bulge. Facet arthropathy. Mild bilateral subarticular narrowing suspected. No significant central canal stenosis is appreciated. No significant neural foraminal narrowing. L4-L5: 3 mm grade 1 anterolisthesis. Disc uncovering with disc bulge. Advanced facet arthropathy. Ligamentum flavum hypertrophy. Bilateral subarticular stenosis. At least moderate central canal stenosis. Bilateral neural foraminal narrowing (moderate/severe right, moderate left). L5-S1: Disc bulge with endplate osteophytes. Facet arthropathy (predominantly on the right). No evidence of significant spinal canal stenosis. Bilateral neural foraminal narrowing (moderate/severe right, moderate left). IMPRESSION: 1. Scattered lucent osseous lesions within the lumbar spine, sacrum, visualized iliac bones and visualized lower thoracic spine consistent with the history of multiple myeloma. No evidence of a lumbar spine fracture. 2. Lumbar spondylosis as outlined within the body of the report. 3. At L4-L5, there is 3 mm facet-mediated grade 1 anterolisthesis. Moderate-to-advanced disc space narrowing. Multifactorial bilateral subarticular stenosis and at least moderate central canal stenosis. A lumbar spine MRI may be obtained for further evaluation, as clinically warranted. Bilateral neural foraminal narrowing (moderate/severe right, moderate left). 4. At L5-S1, there is advanced disc space narrowing. Multifactorial bilateral neural foraminal narrowing (moderate/severe right, moderate left). 5. At L2-L3, a disc bulge results in bilateral subarticular and mild central canal narrowing. 6. No significant central canal stenosis, and no more than mild subarticular narrowing, appreciated at the remaining lumbar levels. Electronically Signed   By: Jackey Loge D.O.   On:  12/31/2023 15:57     ASSESSMENT & PLAN:   83 y.o. female with  1.  Nonsecretory multiple Myeloma, RISS Stage III  Labs upon initial presentation from 12/08/18, blood counts are normal including WBC at 7.1k, HGB at 13.1, and PLT at 245k. Calcium normal at 10.3. Creatinine normal at 0.63. M spike at 0.5g. 12/13/18 Bone Scan revealed Multifocal uptake throughout the skeleton, consistent with diffuse metastatic disease. Primary tumor is not specified. 2. Uptake in the proximal right femur, consistent with lytic lesions. 3. Uptake in the ribs bilaterally as described. 4. Lesions in the proximal left humerus. 5. Diffuse uptake throughout the skull consistent with metastatic disease. 6. Right paramedian uptake at the manubrium.  12/13/18 CT Right Femur revealed Numerous lytic lesions involving the right femur and a lytic lesion in the left inferior pubic ramus. Overall appearance is most concerning for multiple myeloma  12/27/18 Pretreatment 24hour UPEP observed an M spike at 18mg , and showed 199mg  total protein/day.  12/27/18 Pretreatment MMP revealed M Protein at 0.5g with IgG Lambda specificity. Kappa:Lambda light chain ratio at 0.13, with Lambda at 40.3. There is less abnormal protein and light chains than I would expect from 30% plasma cells, which suggests hypo-secretory or non-secretory neoplastic  plasma cells. Will have an impact in assessing response. 01/05/19 PET/CT revealed Innumerable lytic lesions in the skeleton compatible with myeloma. Most of the larger lesions are hypermetabolic, for example including a left proximal humeral shaft lesion with maximum SUV of 8.1 and a 2.8 cm lesion in the left T9 vertebral body with maximum SUV 5.1. Most of the smaller lytic lesions, and some of the larger lesions, do not demonstrate accentuated metabolic activity. 2. 1.2 cm in short axis lymph node in the left parapharyngeal space is hypermetabolic with maximum SUV 11.8. I do not see a separate mass in the  head and neck to give rise to this hypermetabolic lymph node. 3. Mosaic attenuation in the lower lobes, nonspecific possibly from air trapping. 4.  Aortic Atherosclerosis 5. Heterogeneous activity in the liver, making it hard to exclude small liver lesions. Consider hepatic protocol MRI with and without contrast for definitive assessment. Nonobstructive right nephrolithiasis. Old granulomatous disease  01/06/19 Bone Marrow biopsy revealed interstitial increase in plasma cells (28% aspirate, 40% CD138 immunohistochemistry). Plasma cells negative for light chains consistent with a non or weakly secretory myeloma   01/06/19 Cytogenetics revealed 37% of cells with trisomy 11 or 11q deletion, and 40.5% of cells with 17p mutation  S/p 5 cycles of KRD treatment  05/31/19 BM Biopsy revealed mild atypical plasmacytosis at 5% with polytypic variation.   06/01/19 PET/CT revealed "Dominant lesion in the LEFT humerus is decreased significantly in metabolic activity. Additional hypermetabolic skeletal lytic lesions have decreased in metabolic activity or similar to comparison exam (01/05/2019). No evidence of disease progression. 2. Multiple additional lytic lesions do not have metabolic activity and unchanged. 3. No new skeletal lesions are identified. No soft tissue plasmacytoma identified. 4. Nodule / node in the LEFT parapharyngeal space which is intensely hypermetabolic not changed from prior. 5. New hypermetabolic LEFT lower lobe pulmonary nodule is indeterminate. Recommend close attention on follow-up 6. New obstructive hydronephrosis of the RIGHT kidney related to RIGHT UPJ stone."  09/18/2019 BM Bx Report which revealed "Slightly hypercellular bone marrow for age with trilineage hematopoiesis and 1% plasma cells."  09/14/2019 PET/CT Whole Body Scan ([id number]) which revealed "1. There widespread tiny lytic lesions compatible with multiple myeloma. Index larger lesions are generally similar to the prior exam,  with low-grade activity such as the left T9 vertebral body lesion with maximum SUV 4.5. Is mild increase in the activity associated with a mildly sclerotic left proximal humeral lesion, maximum SUV 4.8 (previously 3.5). 2. At the site of the prior left lower lobe nodule is currently more bandlike thickening, with maximum SUV only 1.9, probably benign, continued surveillance of this region suggested. 3. There several small but hypermetabolic lymph nodes. This includes a left parapharyngeal space node measuring 1.0 cm with maximum SUV 12.3 (stable); a left level IB lymph node measuring 0.5 cm with maximum SUV 4.8 (slightly larger than prior); and a left inguinal lymph node measuring 0.7 cm in short axis with maximum SUV 6.4 (previously 0.5 cm with maximum SUV 0.6). Significance of these lymph nodes uncertain, surveillance is recommended. 4. New 5 mm left lower lobe subpleural nodule on image 32/8, not appreciably hypermetabolic, surveillance suggested. 5. Focal subcutaneous stranding along the left perineum measuring about 2.6 by 1.1 cm on image 221/4, maximum SUV 12.5. This was not present previously and is most likely inflammatory, although given the notable SUV, surveillance of this region is suggested. 6. Other imaging findings of potential clinical significance: Aortic Atherosclerosis (ICD10-I70.0). Coronary atherosclerosis. Old granulomatous disease.  Mild right hydronephrosis due to a 7 mm right UPJ calculus. 2 mm right kidney upper pole nonobstructive renal calculus. Prominent stool throughout the colon favors constipation."  12/19/2019 Thoracic & Lumbar Spine MRI ([id number]) ([id number]) revealed "Suspected myeloma lesions at T9 and S1. No compression deformity or epidural disease.  01/18/2020 PET/CT ([id number]) which revealed "1. Stable lytic lesions throughout the skeleton. The larger lytic lesions which had mild metabolic activity on comparison exam now have background metabolic activity. No evidence  of active myeloma. No evidence of progression multiple myeloma.  No plasmacytoma 3. Hypermetabolic nodules in the LEFT neck may be associated deep tissues of the LEFT parotid gland. 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  01/17/19 MRI Liver revealed Several appreciable liver lesions all have benign imaging characteristics. No MRI findings of metastatic involvement of the liver. 2. Scattered bony lesions corresponding to the lytic lesions seen at PET-CT, compatible with active myeloma. 3. Aortic Atherosclerosis.  Mild cardiomegaly. 4. Diffuse hepatic steatosis.   3. Left lower lobe pulmonary nodule First seen on 06/01/19 PET/CT PET/CT 08/27/2020: No hypermetabolic mediastinal or hilar nodes. No suspicious pulmonary nodules on the CT scan.  4. 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 Being managed conservatively as per patient's preference.  No symptoms from this currently.  5. Mild chronic kidney disease CMP done 04/09/2022 showed creatinine of 1.17 and GFR est of 48 consistent with mild chronic kidney disease.  PLAN:  -patient did receive a PET scan on 10/14/2023, and there were no active lesions in the back or other areas at that time.  -CT abdomen/pelvis scan on 12/31/2023 showed no acute findings in the abdomen or pelvis. There were findings of some lesions of myeloma in the bones, though there is uncertainty determining if these lesions are old or new.  -Discussed lab results on 01/14/2024 in detail with patient. CBC normal, showed WBC of 5.1K, hemoglobin of 14.0, and platelets of 204K. -CMP shows blood sugar level of 361; patient shall have her blood sugar  rechecked -Patient's back pain was noted to be mostly in her central back over the sacrum and right sacroiliac joint.  -patient was noted to have plenty of back issues, including Lumbar spondylosis, Significant arthritis, degenerative dis disease, at least moderate central canal stenosis, and Bilateral narrowing at L5 and S1, contributing to her back pain -educated patient that it is possible that the canal has been narrowed from disc issues and arthritis -will order MRI to further evaluate -Would recommend receiving PET scan sooner than her currently scheduled date of 01/27/2024 to receive it as soon as possible -discussed option of medical transportation to receive PET scan sooner -discussed that there may be a role for radiation therapy or orthopedics treatment depending on findings.  -discussed that if there are no concerns on her PET scan, there may be a role for steroid injections with orthopedics  -discussed that there is a need to limit pain medication due to high risk of confusion and falls -limit use of Tylenol to 2g daily max. Okay to take 1000 mg twice daily.  -will order low-dose 2 MG Tizanidine to reduce muscle spasms -reduce oxycodone to every 6 hours or less as needed -will temporarily increase fentanyl patch  from 12.5 mcg to 25 mcg every 72 hours to limit the use of oxycodone to limit confusion -educated patient that lidocaine patch should be discarded after using for 12 hours during the day  -will reduce dexamethasone to one tablet daily for 2-3 days then half a tablet for 2-3 days, then stop -answered all of patient's questions in detail  FOLLOW UP: -MRI lumbosacral spine ASAP -PET/CT -Continue treatment per integrated scheduling -MD visit in 2 weeks with next treatment to discuss MRI and PET/CT  The total time spent in the appointment was *** minutes* .  All of the patient's questions were answered with apparent satisfaction. The patient knows to call the clinic with any  problems, questions or concerns.   Wyvonnia Lora MD MS AAHIVMS [facility] Hematology/Oncology Physician [facility]  .*Total Encounter Time as defined by the Centers for Medicare and Medicaid Services includes, in addition to the face-to-face time of a patient visit (documented in the note above) non-face-to-face time: obtaining and reviewing outside history, ordering and reviewing medications, tests or procedures, care coordination (communications with other health care professionals or caregivers) and documentation in the medical record.    I,Mitra Faeizi,acting as a Neurosurgeon for Wyvonnia Lora, MD.,have documented all relevant documentation on the behalf of Wyvonnia Lora, MD,as directed by  Wyvonnia Lora, MD while in the presence of Wyvonnia Lora, MD.  ***

## 2024-01-14 ENCOUNTER — Inpatient Hospital Stay: Payer: Medicare PPO

## 2024-01-14 ENCOUNTER — Other Ambulatory Visit: Payer: Self-pay | Admitting: Hematology

## 2024-01-14 ENCOUNTER — Inpatient Hospital Stay (HOSPITAL_BASED_OUTPATIENT_CLINIC_OR_DEPARTMENT_OTHER): Payer: Medicare PPO | Admitting: Hematology

## 2024-01-14 VITALS — BP 136/69 | HR 69 | Temp 97.7°F | Resp 16 | Ht 59.0 in | Wt 147.1 lb

## 2024-01-14 DIAGNOSIS — Z5112 Encounter for antineoplastic immunotherapy: Secondary | ICD-10-CM | POA: Diagnosis not present

## 2024-01-14 DIAGNOSIS — C9000 Multiple myeloma not having achieved remission: Secondary | ICD-10-CM | POA: Diagnosis not present

## 2024-01-14 DIAGNOSIS — Z7189 Other specified counseling: Secondary | ICD-10-CM

## 2024-01-14 DIAGNOSIS — N189 Chronic kidney disease, unspecified: Secondary | ICD-10-CM | POA: Diagnosis not present

## 2024-01-14 DIAGNOSIS — R911 Solitary pulmonary nodule: Secondary | ICD-10-CM | POA: Diagnosis not present

## 2024-01-14 DIAGNOSIS — Z95828 Presence of other vascular implants and grafts: Secondary | ICD-10-CM

## 2024-01-14 DIAGNOSIS — M545 Low back pain, unspecified: Secondary | ICD-10-CM

## 2024-01-14 DIAGNOSIS — Z79899 Other long term (current) drug therapy: Secondary | ICD-10-CM | POA: Diagnosis not present

## 2024-01-14 LAB — CBC WITH DIFFERENTIAL (CANCER CENTER ONLY)
Abs Immature Granulocytes: 0.02 10*3/uL (ref 0.00–0.07)
Basophils Absolute: 0 10*3/uL (ref 0.0–0.1)
Basophils Relative: 0 %
Eosinophils Absolute: 0 10*3/uL (ref 0.0–0.5)
Eosinophils Relative: 0 %
HCT: 41.6 % (ref 36.0–46.0)
Hemoglobin: 14 g/dL (ref 12.0–15.0)
Immature Granulocytes: 0 %
Lymphocytes Relative: 17 %
Lymphs Abs: 0.8 10*3/uL (ref 0.7–4.0)
MCH: 33.7 pg (ref 26.0–34.0)
MCHC: 33.7 g/dL (ref 30.0–36.0)
MCV: 100 fL (ref 80.0–100.0)
Monocytes Absolute: 0.9 10*3/uL (ref 0.1–1.0)
Monocytes Relative: 17 %
Neutro Abs: 3.4 10*3/uL (ref 1.7–7.7)
Neutrophils Relative %: 66 %
Platelet Count: 204 10*3/uL (ref 150–400)
RBC: 4.16 MIL/uL (ref 3.87–5.11)
RDW: 13.2 % (ref 11.5–15.5)
WBC Count: 5.1 10*3/uL (ref 4.0–10.5)
nRBC: 0 % (ref 0.0–0.2)

## 2024-01-14 LAB — CMP (CANCER CENTER ONLY)
ALT: 21 U/L (ref 0–44)
AST: 13 U/L — ABNORMAL LOW (ref 15–41)
Albumin: 4 g/dL (ref 3.5–5.0)
Alkaline Phosphatase: 79 U/L (ref 38–126)
Anion gap: 11 (ref 5–15)
BUN: 33 mg/dL — ABNORMAL HIGH (ref 8–23)
CO2: 21 mmol/L — ABNORMAL LOW (ref 22–32)
Calcium: 10.1 mg/dL (ref 8.9–10.3)
Chloride: 103 mmol/L (ref 98–111)
Creatinine: 0.92 mg/dL (ref 0.44–1.00)
GFR, Estimated: >60 mL/min (ref >60)
Glucose, Bld: 361 mg/dL — ABNORMAL HIGH (ref 70–99)
Potassium: 4.3 mmol/L (ref 3.5–5.1)
Sodium: 135 mmol/L (ref 135–145)
Total Bilirubin: 0.7 mg/dL (ref 0.0–1.2)
Total Protein: 6.2 g/dL — ABNORMAL LOW (ref 6.5–8.1)

## 2024-01-14 LAB — GLUCOSE, CAPILLARY: Glucose-Capillary: 204 mg/dL — ABNORMAL HIGH (ref 70–99)

## 2024-01-14 MED ORDER — HEPARIN SOD (PORK) LOCK FLUSH 100 UNIT/ML IV SOLN
500.0000 [IU] | Freq: Once | INTRAVENOUS | Status: AC | PRN
  Administered 2024-01-14: 500 [IU]

## 2024-01-14 MED ORDER — SODIUM CHLORIDE 0.9% FLUSH
10.0000 mL | Freq: Once | INTRAVENOUS | Status: AC
  Administered 2024-01-14: 10 mL

## 2024-01-14 MED ORDER — ACETAMINOPHEN 500 MG PO TABS: 1000.0000 mg | ORAL_TABLET | Freq: Two times a day (BID) | ORAL | Status: DC

## 2024-01-14 MED ORDER — FENTANYL 12 MCG/HR TD PT72: 2.0000 | MEDICATED_PATCH | TRANSDERMAL | 0 refills | Status: DC

## 2024-01-14 MED ORDER — PROCHLORPERAZINE MALEATE 10 MG PO TABS
10.0000 mg | ORAL_TABLET | Freq: Once | ORAL | Status: AC
  Administered 2024-01-14: 10 mg via ORAL
  Filled 2024-01-14: qty 1

## 2024-01-14 MED ORDER — DEXTROSE 5 % IV SOLN
36.0000 mg/m2 | Freq: Once | INTRAVENOUS | Status: AC
  Administered 2024-01-14: 60 mg via INTRAVENOUS
  Filled 2024-01-14: qty 30

## 2024-01-14 MED ORDER — SODIUM CHLORIDE 0.9 % IV SOLN: Freq: Once | INTRAVENOUS | Status: AC

## 2024-01-14 MED ORDER — SODIUM CHLORIDE 0.9% FLUSH
10.0000 mL | INTRAVENOUS | Status: DC | PRN
  Administered 2024-01-14: 10 mL

## 2024-01-14 MED ORDER — ACETAMINOPHEN 325 MG PO TABS
650.0000 mg | ORAL_TABLET | Freq: Once | ORAL | Status: AC
  Administered 2024-01-14: 650 mg via ORAL
  Filled 2024-01-14: qty 2

## 2024-01-14 MED ORDER — TIZANIDINE HCL 2 MG PO CAPS: 2.0000 mg | ORAL_CAPSULE | Freq: Two times a day (BID) | ORAL | 0 refills | Status: AC

## 2024-01-14 MED ORDER — DEXAMETHASONE 4 MG PO TABS
8.0000 mg | ORAL_TABLET | Freq: Once | ORAL | Status: AC
  Administered 2024-01-14: 8 mg via ORAL
  Filled 2024-01-14: qty 2

## 2024-01-14 MED ORDER — DIPHENHYDRAMINE HCL 25 MG PO CAPS
25.0000 mg | ORAL_CAPSULE | Freq: Once | ORAL | Status: AC
  Administered 2024-01-14: 25 mg via ORAL
  Filled 2024-01-14: qty 1

## 2024-01-14 MED ORDER — FAMOTIDINE IN NACL 20-0.9 MG/50ML-% IV SOLN
20.0000 mg | Freq: Once | INTRAVENOUS | Status: AC
Start: 2024-01-14 — End: 2024-01-14
  Administered 2024-01-14: 20 mg via INTRAVENOUS
  Filled 2024-01-14: qty 50

## 2024-01-14 NOTE — Addendum Note (Signed)
Addended by: Boykin Reaper L on: 01/14/2024 12:04 PM   Modules accepted: Orders

## 2024-01-14 NOTE — Patient Instructions (Signed)
 [facility] - A DEPT OF [facility]  Discharge Instructions: Thank you for choosing [facility] to provide your oncology and hematology care.   If you have a lab appointment with the Cancer Center, please go directly to the Cancer Center and check in at the registration area.   Wear comfortable clothing and clothing appropriate for easy access to any Portacath or PICC line.   We strive to give you quality time with your provider. You may need to reschedule your appointment if you arrive late (15 or more minutes).  Arriving late affects you and other patients whose appointments are after yours.  Also, if you miss three or more appointments without notifying the office, you may be dismissed from the clinic at the provider's discretion.      For prescription refill requests, have your pharmacy contact our office and allow 72 hours for refills to be completed.    Today you received the following chemotherapy and/or immunotherapy agents: Kyprolis.       To help prevent nausea and vomiting after your treatment, we encourage you to take your nausea medication as directed.  BELOW ARE SYMPTOMS THAT SHOULD BE REPORTED IMMEDIATELY: *FEVER GREATER THAN 100.4 F (38 C) OR HIGHER *CHILLS OR SWEATING *NAUSEA AND VOMITING THAT IS NOT CONTROLLED WITH YOUR NAUSEA MEDICATION *UNUSUAL SHORTNESS OF BREATH *UNUSUAL BRUISING OR BLEEDING *URINARY PROBLEMS (pain or burning when urinating, or frequent urination) *BOWEL PROBLEMS (unusual diarrhea, constipation, pain near the anus) TENDERNESS IN MOUTH AND THROAT WITH OR WITHOUT PRESENCE OF ULCERS (sore throat, sores in mouth, or a toothache) UNUSUAL RASH, SWELLING OR PAIN  UNUSUAL VAGINAL DISCHARGE OR ITCHING   Items with * indicate a potential emergency and should be followed up as soon as possible or go to the Emergency Department if any problems should occur.  Please show the CHEMOTHERAPY ALERT CARD or IMMUNOTHERAPY  ALERT CARD at check-in to the Emergency Department and triage nurse.  Should you have questions after your visit or need to cancel or reschedule your appointment, please contact [facility] - A DEPT OF [facility]  Dept: [phone]  and follow the prompts.  Office hours are 8:00 a.m. to 4:30 p.m. Monday - Friday. Please note that voicemails left after 4:00 p.m. may not be returned until the following business day.  We are closed weekends and major holidays. You have access to a nurse at all times for urgent questions. Please call the main number to the clinic Dept: [phone] and follow the prompts.   For any non-urgent questions, you may also contact your provider using MyChart. We now offer e-Visits for anyone 45 and older to request care online for non-urgent symptoms. For details visit [email or web address].   Also download the MyChart app! Go to the app store, search "MyChart", open the app, select [place], and log in with your MyChart username and password.

## 2024-01-14 NOTE — Progress Notes (Signed)
Patient seen by Dr. Addison Naegeli are within treatment parameters.  Labs reviewed: and are not all within treatment parameters.    Dr Candise Che aware Glucose 361,   Per physician team, patient is ready for treatment and there are NO modifications to the treatment plan.  Please recheck blood glucose.

## 2024-01-14 NOTE — Progress Notes (Signed)
CBG 204, no insulin needed per Dr Candise Che

## 2024-01-19 ENCOUNTER — Encounter: Payer: Self-pay | Admitting: Hematology

## 2024-01-20 ENCOUNTER — Encounter: Payer: Self-pay | Admitting: Hematology

## 2024-01-24 ENCOUNTER — Ambulatory Visit (HOSPITAL_COMMUNITY)
Admission: RE | Admit: 2024-01-24 | Discharge: 2024-01-24 | Disposition: A | Discharge status: Home / Self Care | Payer: Medicare PPO | Source: Ambulatory Visit | Attending: Hematology | Admitting: Hematology

## 2024-01-24 DIAGNOSIS — M549 Dorsalgia, unspecified: Secondary | ICD-10-CM | POA: Diagnosis not present

## 2024-01-24 DIAGNOSIS — C9000 Multiple myeloma not having achieved remission: Secondary | ICD-10-CM | POA: Insufficient documentation

## 2024-01-24 LAB — GLUCOSE, CAPILLARY: Glucose-Capillary: 121 mg/dL — ABNORMAL HIGH (ref 70–99)

## 2024-01-24 MED ORDER — FLUDEOXYGLUCOSE F - 18 (FDG) INJECTION
7.3000 | Freq: Once | INTRAVENOUS | Status: AC
  Administered 2024-01-24: 7.32 via INTRAVENOUS

## 2024-01-27 ENCOUNTER — Other Ambulatory Visit (HOSPITAL_COMMUNITY): Payer: Medicare PPO

## 2024-01-28 ENCOUNTER — Inpatient Hospital Stay: Payer: Medicare PPO

## 2024-01-28 ENCOUNTER — Ambulatory Visit: Payer: Medicare PPO

## 2024-01-28 ENCOUNTER — Inpatient Hospital Stay: Payer: Medicare PPO | Attending: Hematology

## 2024-01-28 ENCOUNTER — Other Ambulatory Visit: Payer: Self-pay

## 2024-01-28 VITALS — BP 127/63 | HR 64 | Temp 98.1°F | Resp 16 | Wt 150.8 lb

## 2024-01-28 DIAGNOSIS — Z79899 Other long term (current) drug therapy: Secondary | ICD-10-CM | POA: Diagnosis not present

## 2024-01-28 DIAGNOSIS — C9000 Multiple myeloma not having achieved remission: Secondary | ICD-10-CM

## 2024-01-28 DIAGNOSIS — Z7189 Other specified counseling: Secondary | ICD-10-CM

## 2024-01-28 DIAGNOSIS — Z95828 Presence of other vascular implants and grafts: Secondary | ICD-10-CM

## 2024-01-28 DIAGNOSIS — Z5112 Encounter for antineoplastic immunotherapy: Secondary | ICD-10-CM | POA: Insufficient documentation

## 2024-01-28 LAB — CBC WITH DIFFERENTIAL (CANCER CENTER ONLY)
Abs Immature Granulocytes: 0.01 10*3/uL (ref 0.00–0.07)
Basophils Absolute: 0 10*3/uL (ref 0.0–0.1)
Basophils Relative: 1 %
Eosinophils Absolute: 0.2 10*3/uL (ref 0.0–0.5)
Eosinophils Relative: 4 %
HCT: 38.4 % (ref 36.0–46.0)
Hemoglobin: 12.7 g/dL (ref 12.0–15.0)
Immature Granulocytes: 0 %
Lymphocytes Relative: 23 %
Lymphs Abs: 0.9 10*3/uL (ref 0.7–4.0)
MCH: 33.7 pg (ref 26.0–34.0)
MCHC: 33.1 g/dL (ref 30.0–36.0)
MCV: 101.9 fL — ABNORMAL HIGH (ref 80.0–100.0)
Monocytes Absolute: 0.7 10*3/uL (ref 0.1–1.0)
Monocytes Relative: 19 %
Neutro Abs: 2 10*3/uL (ref 1.7–7.7)
Neutrophils Relative %: 53 %
Platelet Count: 143 10*3/uL — ABNORMAL LOW (ref 150–400)
RBC: 3.77 MIL/uL — ABNORMAL LOW (ref 3.87–5.11)
RDW: 13.4 % (ref 11.5–15.5)
WBC Count: 3.8 10*3/uL — ABNORMAL LOW (ref 4.0–10.5)
nRBC: 0 % (ref 0.0–0.2)

## 2024-01-28 LAB — CMP (CANCER CENTER ONLY)
ALT: 18 U/L (ref 0–44)
AST: 11 U/L — ABNORMAL LOW (ref 15–41)
Albumin: 3.6 g/dL (ref 3.5–5.0)
Alkaline Phosphatase: 70 U/L (ref 38–126)
Anion gap: 6 (ref 5–15)
BUN: 16 mg/dL (ref 8–23)
CO2: 28 mmol/L (ref 22–32)
Calcium: 9.8 mg/dL (ref 8.9–10.3)
Chloride: 103 mmol/L (ref 98–111)
Creatinine: 0.84 mg/dL (ref 0.44–1.00)
GFR, Estimated: >60 mL/min (ref >60)
Glucose, Bld: 217 mg/dL — ABNORMAL HIGH (ref 70–99)
Potassium: 4.3 mmol/L (ref 3.5–5.1)
Sodium: 137 mmol/L (ref 135–145)
Total Bilirubin: 0.5 mg/dL (ref 0.0–1.2)
Total Protein: 5.7 g/dL — ABNORMAL LOW (ref 6.5–8.1)

## 2024-01-28 MED ORDER — SODIUM CHLORIDE 0.9% FLUSH
10.0000 mL | Freq: Once | INTRAVENOUS | Status: AC
  Administered 2024-01-28: 10 mL

## 2024-01-28 MED ORDER — SODIUM CHLORIDE 0.9 % IV SOLN: Freq: Once | INTRAVENOUS | Status: AC

## 2024-01-28 MED ORDER — LENALIDOMIDE 5 MG PO CAPS: ORAL_CAPSULE | ORAL | 0 refills | Status: DC

## 2024-01-28 MED ORDER — PROCHLORPERAZINE MALEATE 10 MG PO TABS
10.0000 mg | ORAL_TABLET | Freq: Once | ORAL | Status: AC
  Administered 2024-01-28: 10 mg via ORAL
  Filled 2024-01-28: qty 1

## 2024-01-28 MED ORDER — SODIUM CHLORIDE 0.9% FLUSH
10.0000 mL | INTRAVENOUS | Status: DC | PRN
  Administered 2024-01-28: 10 mL

## 2024-01-28 MED ORDER — DEXTROSE 5 % IV SOLN
36.0000 mg/m2 | Freq: Once | INTRAVENOUS | Status: AC
  Administered 2024-01-28: 60 mg via INTRAVENOUS
  Filled 2024-01-28: qty 30

## 2024-01-28 MED ORDER — HEPARIN SOD (PORK) LOCK FLUSH 100 UNIT/ML IV SOLN
500.0000 [IU] | Freq: Once | INTRAVENOUS | Status: AC | PRN
  Administered 2024-01-28: 500 [IU]

## 2024-01-28 MED ORDER — FAMOTIDINE IN NACL 20-0.9 MG/50ML-% IV SOLN
20.0000 mg | Freq: Once | INTRAVENOUS | Status: AC
  Administered 2024-01-28: 20 mg via INTRAVENOUS
  Filled 2024-01-28: qty 50

## 2024-01-28 MED ORDER — DIPHENHYDRAMINE HCL 25 MG PO CAPS
25.0000 mg | ORAL_CAPSULE | Freq: Once | ORAL | Status: AC
  Administered 2024-01-28: 25 mg via ORAL
  Filled 2024-01-28: qty 1

## 2024-01-28 MED ORDER — ACETAMINOPHEN 325 MG PO TABS
650.0000 mg | ORAL_TABLET | Freq: Once | ORAL | Status: AC
  Administered 2024-01-28: 650 mg via ORAL
  Filled 2024-01-28: qty 2

## 2024-01-28 MED ORDER — DEXAMETHASONE 4 MG PO TABS
8.0000 mg | ORAL_TABLET | Freq: Once | ORAL | Status: AC
  Administered 2024-01-28: 8 mg via ORAL
  Filled 2024-01-28: qty 2

## 2024-01-28 NOTE — Patient Instructions (Signed)

## 2024-02-01 ENCOUNTER — Other Ambulatory Visit: Payer: Self-pay

## 2024-02-01 ENCOUNTER — Encounter: Payer: Self-pay | Admitting: Hematology

## 2024-02-01 ENCOUNTER — Other Ambulatory Visit: Payer: Self-pay | Admitting: Hematology

## 2024-02-01 DIAGNOSIS — E876 Hypokalemia: Secondary | ICD-10-CM

## 2024-02-01 DIAGNOSIS — C9000 Multiple myeloma not having achieved remission: Secondary | ICD-10-CM

## 2024-02-01 DIAGNOSIS — C7951 Secondary malignant neoplasm of bone: Secondary | ICD-10-CM

## 2024-02-01 MED ORDER — VITAMIN D (ERGOCALCIFEROL) 1.25 MG (50000 UNIT) PO CAPS: 50000.0000 [IU] | ORAL_CAPSULE | ORAL | 0 refills | Status: DC

## 2024-02-02 ENCOUNTER — Encounter: Payer: Self-pay | Admitting: Hematology

## 2024-02-02 MED ORDER — FENTANYL 12 MCG/HR TD PT72: 2.0000 | MEDICATED_PATCH | TRANSDERMAL | 0 refills | Status: DC

## 2024-02-03 ENCOUNTER — Other Ambulatory Visit: Payer: Self-pay

## 2024-02-03 DIAGNOSIS — C9000 Multiple myeloma not having achieved remission: Secondary | ICD-10-CM

## 2024-02-03 MED ORDER — FENTANYL 12 MCG/HR TD PT72: 2.0000 | MEDICATED_PATCH | TRANSDERMAL | 0 refills | Status: DC

## 2024-02-04 ENCOUNTER — Encounter: Payer: Self-pay | Admitting: Hematology

## 2024-02-04 ENCOUNTER — Other Ambulatory Visit: Payer: Self-pay

## 2024-02-04 ENCOUNTER — Other Ambulatory Visit (HOSPITAL_COMMUNITY): Payer: Self-pay

## 2024-02-04 DIAGNOSIS — C9000 Multiple myeloma not having achieved remission: Secondary | ICD-10-CM

## 2024-02-04 MED ORDER — FENTANYL 12 MCG/HR TD PT72
2.0000 | MEDICATED_PATCH | TRANSDERMAL | 0 refills | Status: DC
  Filled 2024-02-04 (×2): qty 10, 15d supply, fill #0
  Filled 2024-02-04: qty 5, 6d supply, fill #0
  Filled 2024-02-04: qty 15, 24d supply, fill #0
  Filled 2024-02-04: qty 20, 30d supply, fill #0

## 2024-02-09 ENCOUNTER — Other Ambulatory Visit: Payer: Self-pay

## 2024-02-09 ENCOUNTER — Encounter: Payer: Self-pay | Admitting: Hematology

## 2024-02-09 DIAGNOSIS — C9000 Multiple myeloma not having achieved remission: Secondary | ICD-10-CM

## 2024-02-09 MED ORDER — OXYCODONE HCL 10 MG PO TABS
10.0000 mg | ORAL_TABLET | Freq: Four times a day (QID) | ORAL | 0 refills | Status: DC | PRN
Start: 2024-02-09 — End: 2024-03-20

## 2024-02-10 NOTE — Progress Notes (Signed)
 HEMATOLOGY/ONCOLOGY CLINIC NOTE  Date of Service: 02/11/2024   Patient Care Team: Myrlene Broker, MD as PCP - General (Internal Medicine) Hart Carwin, MD (Inactive) as Consulting Physician (Gastroenterology) Jerene Bears, MD as Consulting Physician (Gynecology) Marcene Corning, MD as Consulting Physician (Orthopedic Surgery) Waymon Budge, MD as Consulting Physician (Pulmonary Disease) Mateo Flow, MD (Ophthalmology)  CHIEF COMPLAINTS/PURPOSE OF VISIT:  Follow-up for continued evaluation and management of multiple myeloma  HISTORY OF PRESENTING ILLNESS:  Please see previous notes for details on initial presentation  Current Treatment: Carfilzomib + Revlimid maintenance.  INTERVAL HISTORY:   Faith Orr is a 83 y.o. female who is here for continued evaluation and management of the patient's multiple myeloma. She is here to start cycle 13 day 15 of her treatment.   Patient was last seen by me on 01/14/2024 and reported constant dull back ache, improved from previously. She also complained of confusion.   Today, she presents with a walker. Patient reports that her back issues have significantly improved. She reports mild discomfort in the right sacral iliac joint at this time. Patient denies any other specific painful areas in her back.    Patient reports that she continues to use Fentanyl patches every 3 days, but only needs to use one patch at a time.   She reports that previous use of muscle relaxer caused sleepiness so she discontinued.   Patient reports that she is not needing increased break-though medication such as oxycodone.   Patient does have an SI joint brace available at home to use if needed.   She reports having balance issues for a while. Patient notes that her walker does help stabilize her balance. She reports that she is able to move around the home without a walker.   MEDICAL HISTORY:  Past Medical History:  Diagnosis Date    Allergy    seasonal   Asthma    DEPRESSION    DIABETES MELLITUS, TYPE II    Diverticulosis    HYPERLIPIDEMIA    Macular degeneration of left eye    mild, Dr.Hecker   Obesity, unspecified    Osteoarthritis of both knees    OSTEOPENIA    Osteopenia    URINARY INCONTINENCE     SURGICAL HISTORY: Past Surgical History:  Procedure Laterality Date   CATARACT EXTRACTION Left 05/24/2018   CESAREAN SECTION  01/1973   CYSTOSCOPY/URETEROSCOPY/HOLMIUM LASER/STENT PLACEMENT Right 09/20/2019   Procedure: CYSTOSCOPY/URETEROSCOPY/HOLMIUM LASER/STENT PLACEMENT;  Surgeon: [name], MD;  Location: [facility];  Service: Urology;  Laterality: Right;   FRACTURE SURGERY     IR BONE MARROW BIOPSY & ASPIRATION  02/16/2023   IR IMAGING GUIDED PORT INSERTION  02/20/2019   left wrist surgery  2008   By Dr. Yisroel Ramming   right ankle  1994    SOCIAL HISTORY: Social History   Socioeconomic History   Marital status: Married    Spouse name: Not on file   Number of children: 1   Years of education: Not on file   Highest education level: Not on file  Occupational History    Employer: [employer]  Tobacco Use   Smoking status: Never   Smokeless tobacco: Never   Tobacco comments:    Lives with partner [name]) and son  Vaping Use   Vaping status: Never Used  Substance and Sexual Activity   Alcohol use: No    Alcohol/week: 0.0 standard drinks of alcohol   Drug use:  No   Sexual activity: Never    Partners: Female    Birth control/protection: Post-menopausal    Comment: Lives with female partner (annette hicks) and 34 yo son  Other Topics Concern   Not on file  Social History Narrative   Not on file   Social Drivers of Health   Financial Resource Strain: Low Risk  (09/09/2023)   Overall Financial Resource Strain (CARDIA)    Difficulty of Paying Living Expenses: Not hard at all  Food Insecurity: No Food Insecurity (09/09/2023)   Hunger Vital Sign    Worried  About Running Out of Food in the Last Year: Never true    Ran Out of Food in the Last Year: Never true  Transportation Needs: No Transportation Needs (09/09/2023)   PRAPARE - Administrator, Civil Service (Medical): No    Lack of Transportation (Non-Medical): No  Physical Activity: Inactive (09/09/2023)   Exercise Vital Sign    Days of Exercise per Week: 0 days    Minutes of Exercise per Session: 0 min  Stress: No Stress Concern Present (09/09/2023)   [employer] of Occupational Health - Occupational Stress Questionnaire    Feeling of Stress : Not at all  Social Connections: Socially Integrated (09/09/2023)   Social Connection and Isolation Panel [NHANES]    Frequency of Communication with Friends and Family: More than three times a week    Frequency of Social Gatherings with Friends and Family: More than three times a week    Attends Religious Services: More than 4 times per year    Active Member of [employer] or Organizations: Yes    Attends Engineer, structural: More than 4 times per year    Marital Status: Married  Catering manager Violence: Not At Risk (05/08/2021)   Humiliation, Afraid, Rape, and Kick questionnaire    Fear of Current or Ex-Partner: No    Emotionally Abused: No    Physically Abused: No    Sexually Abused: No    FAMILY HISTORY: Family History  Problem Relation Age of Onset   Diabetes Father    Hyperlipidemia Father    Heart disease Father    Cancer Father    Hypertension Father    Colon cancer Paternal Grandmother 9   Osteoporosis Mother    Protein S deficiency Mother    Hyperlipidemia Mother    Multiple sclerosis Daughter    Cancer Other        bladder   Breast cancer Neg Hx     ALLERGIES:  is allergic to levofloxacin, penicillins, aleve [naproxen sodium], and sulfonamide derivatives.  MEDICATIONS:  Current Outpatient Medications  Medication Sig Dispense Refill   acyclovir (ZOVIRAX) 400 MG tablet TAKE 1 TABLET(400 MG) BY MOUTH  TWICE DAILY 60 tablet 5   Blood Glucose Monitoring Suppl (FREESTYLE FREEDOM LITE) W/DEVICE KIT Use to check blood sugars twice a day Dx 250.00 1 each 0   Calcium Carbonate-Vitamin D 600-400 MG-UNIT tablet Take 1 tablet by mouth 2 (two) times daily.     dexamethasone (DECADRON) 4 MG tablet Take 5 tablets (20 mg total) by mouth once a week. On D22 of each cycle of treatment 20 tablet 5   ELIQUIS 2.5 MG TABS tablet TAKE 1 TABLET(2.5 MG) BY MOUTH TWICE DAILY 60 tablet 2   fentaNYL (DURAGESIC) 12 MCG/HR Place 2 patches onto the skin every 3 (three) days. 20 patch 0   fluticasone (FLONASE) 50 MCG/ACT nasal spray Place 1 spray into both nostrils daily. 48  g 3   gabapentin (NEURONTIN) 100 MG capsule Take 1 capsule (100 mg total) by mouth 2 (two) times daily. 60 capsule 0   glucose blood (FREESTYLE LITE) test strip CHECK BLOOD SUGAR TWICE DAILY AS DIRECTED Dx 250.00 180 each 3   Lancets (FREESTYLE) lancets Use twice daily to check sugars. 100 each 11   lenalidomide (REVLIMID) 5 MG capsule TAKE 1 CAPSULE BY MOUTH DAILY  FOR 21 DAYS, THEN 7 DAYS OFF 21 capsule 0   lidocaine (LIDODERM) 5 % Place 1 patch onto the skin daily. Remove & Discard patch within 12 hours or as directed by MD 30 patch 0   lidocaine-prilocaine (EMLA) cream APPLY 1 APPLICATION TO THE AFFECTED AREA AS NEEDED. USE PRIOR TO PORT ACCESS 30 g 0   meclizine (ANTIVERT) 25 MG tablet Take 1 tablet (25 mg total) by mouth every 8 (eight) hours as needed for dizziness. 20 tablet 0   metFORMIN (GLUCOPHAGE-XR) 500 MG 24 hr tablet Take 1 tablet (500 mg total) by mouth 2 (two) times daily with a meal. 180 tablet 3   metroNIDAZOLE (METROGEL) 1 % gel Apply topically in the morning and at bedtime. 60 g 1   Multiple Vitamins-Minerals (ICAPS) CAPS Take 1 capsule by mouth daily after breakfast.     ondansetron (ZOFRAN) 8 MG tablet Take 1 tablet (8 mg total) by mouth 2 (two) times daily as needed (Nausea or vomiting). 30 tablet 1   Oxycodone HCl 10 MG TABS  Take 1 tablet (10 mg total) by mouth every 6 (six) hours as needed. 90 tablet 0   pantoprazole (PROTONIX) 20 MG tablet TAKE 1 TABLET(20 MG) BY MOUTH DAILY 30 tablet 2   polyethylene glycol (MIRALAX / GLYCOLAX) packet Take 17 g by mouth daily after breakfast.     potassium chloride SA (KLOR-CON M) 20 MEQ tablet TAKE 2 TABLETS(40 MEQ) BY MOUTH TWICE DAILY 360 tablet 1   prochlorperazine (COMPAZINE) 10 MG tablet Take 1 tablet (10 mg total) by mouth every 6 (six) hours as needed (Nausea or vomiting). 30 tablet 1   senna-docusate (SENNA S) 8.6-50 MG tablet Take 2 tablets by mouth at bedtime. 60 tablet 2   sertraline (ZOLOFT) 100 MG tablet Take 1 tablet (100 mg total) by mouth daily. 90 tablet 3   simvastatin (ZOCOR) 20 MG tablet Take 1 tablet (20 mg total) by mouth daily at 6 PM. 90 tablet 3   tizanidine (ZANAFLEX) 2 MG capsule Take 1 capsule (2 mg total) by mouth in the morning and at bedtime. 20 capsule 0   Vitamin D, Ergocalciferol, (DRISDOL) 1.25 MG (50000 UNIT) CAPS capsule Take 1 capsule (50,000 Units total) by mouth every 7 (seven) days. 12 capsule 0   No current facility-administered medications for this visit.   Facility-Administered Medications Ordered in Other Visits  Medication Dose Route Frequency Provider Last Rate Last Admin   heparin lock flush 100 unit/mL  500 Units Intracatheter Once PRN Johney Maine, MD       sodium chloride flush (NS) 0.9 % injection 10 mL  10 mL Intracatheter PRN Johney Maine, MD   10 mL at 12/17/23 1336    REVIEW OF SYSTEMS:    10 Point review of Systems was done is negative except as noted above.   PHYSICAL EXAMINATION: ECOG FS:2 - Symptomatic, <50% confined to bed .LMP 10/09/2012  .LMP 10/09/2012    GENERAL:alert, in no acute distress and comfortable SKIN: no acute rashes, no significant lesions EYES: conjunctiva are pink and  non-injected, sclera anicteric OROPHARYNX: MMM, no exudates, no oropharyngeal erythema or ulceration NECK:  supple, no JVD LYMPH:  no palpable lymphadenopathy in the cervical, axillary or inguinal regions LUNGS: clear to auscultation b/l with normal respiratory effort HEART: regular rate & rhythm ABDOMEN:  normoactive bowel sounds , non tender, not distended. Extremity: no pedal edema PSYCH: alert & oriented x 3 with fluent speech NEURO: no focal motor/sensory deficits   LABS .    Latest Ref Rng & Units 01/28/2024    1:22 PM 01/14/2024   11:34 AM 12/31/2023   11:44 AM  CBC  WBC 4.0 - 10.5 K/uL 3.8  5.1  4.0   Hemoglobin 12.0 - 15.0 g/dL 46.9  62.9  52.8   Hematocrit 36.0 - 46.0 % 38.4  41.6  38.7   Platelets 150 - 400 K/uL 143  204  148    .    Latest Ref Rng & Units 01/28/2024    1:22 PM 01/14/2024   11:34 AM 12/31/2023   11:44 AM  CMP  Glucose 70 - 99 mg/dL 413  244  010   BUN 8 - 23 mg/dL 16  33  16   Creatinine 0.44 - 1.00 mg/dL 2.72  5.36  6.44   Sodium 135 - 145 mmol/L 137  135  139   Potassium 3.5 - 5.1 mmol/L 4.3  4.3  4.2   Chloride 98 - 111 mmol/L 103  103  109   CO2 22 - 32 mmol/L 28  21  25    Calcium 8.9 - 10.3 mg/dL 9.8  03.4  9.7   Total Protein 6.5 - 8.1 g/dL 5.7  6.2  5.9   Total Bilirubin 0.0 - 1.2 mg/dL 0.5  0.7  0.7   Alkaline Phos 38 - 126 U/L 70  79  88   AST 15 - 41 U/L 11  13  13    ALT 0 - 44 U/L 18  21  13       09/18/2019 BM Bx Report ([id number])   09/18/2019 FISH Panel    05/30/2019 BM Bx   01/06/2019 BM Bx:     01/06/19 Cytogenetics:      05/30/19 BM Biopsy:   09/18/2019 FISH Panel    09/18/2019 BM Surgical Pathology ([id number])     RADIOGRAPHIC STUDIES: I have personally reviewed the radiological images as listed and agreed with the findings in the report. NM PET Image Restage (PS) Whole Body Result Date: 02/07/2024 CLINICAL DATA:  Subsequent treatment strategy for multiple myeloma. EXAM: NUCLEAR MEDICINE PET WHOLE BODY TECHNIQUE: 7.32 mCi F-18 FDG was injected intravenously. Full-ring PET imaging was performed from  the head to foot after the radiotracer. CT data was obtained and used for attenuation correction and anatomic localization. Fasting blood glucose: 121 mg/dl COMPARISON:  74/25/9563 FINDINGS: Mediastinal blood pool activity: SUV max 7.32 HEAD/NECK: Within the left parapharyngeal soft tissue space there is a persistent tracer avid nodule which measures 1 cm with SUV max 10.68, image 43/4. On the previous exam this measured 1 cm with SUV max of 9.87. Tracer avid left level 2 lymph node measures 1.2 cm with SUV max of 17.9. On the previous exam this measured 1 cm with SUV max of 19.0. Incidental CT findings: none CHEST: No tracer avid supraclavicular, axillary, mediastinal or hilar lymph nodes. No tracer avid pulmonary nodules identified. Incidental CT findings: Aortic atherosclerosis. Coronary artery calcifications. Calcified mediastinal and left hilar lymph nodes. ABDOMEN/PELVIS: No abnormal hypermetabolic activity within the liver, pancreas, adrenal glands,  or spleen. No hypermetabolic lymph nodes in the abdomen or pelvis. Incidental CT findings: Calcified granulomas identified within the spleen. Aortic atherosclerotic calcifications. SKELETON: NO TRACER AVID LUCENT BONE LESIONS IDENTIFIED TO SUGGEST METABOLICALLY ACTIVE MULTIPLE MYELOMA.  SCATTERED LUCENT BONE LESIONS ARE IDENTIFIED WITHOUT INCREASED RADIOTRACER UPTAKE COMPATIBLE WITH TREATED DISEASE.  FOR EXAMPLE: -Within the T9 vertebra there is a lucent bone lesion measuring 2.8 cm without significant increased radiotracer uptake, image 101 of series 4. This is unchanged from previous exam. -Small lucent lesions within the left iliac bone also appear unchanged from previous exam without corresponding increased uptake, image 159/4. Incidental CT findings: Multiple remote right rib fractures are again noted with corresponding mild increased radiotracer uptake. EXTREMITIES: No abnormal hypermetabolic activity in the lower extremities. Incidental CT findings: none  IMPRESSION: 1. No signs of metabolically active lesions of multiple myeloma. 2. Persistent tracer avid left parapharyngeal soft tissue nodule/node and left level 2 lymph node. These are unchanged in size and degree of tracer uptake. The 3. Stable appearance of lucent bone lesions without increased radiotracer uptake compatible with treated disease. 4.  Aortic Atherosclerosis (ICD10-I70.0). Electronically Signed   By: Signa Kell M.D.   On: 02/07/2024 09:45     ASSESSMENT & PLAN:   83 y.o. female with  1.  Nonsecretory multiple Myeloma, RISS Stage III  Labs upon initial presentation from 12/08/18, blood counts are normal including WBC at 7.1k, HGB at 13.1, and PLT at 245k. Calcium normal at 10.3. Creatinine normal at 0.63. M spike at 0.5g. 12/13/18 Bone Scan revealed Multifocal uptake throughout the skeleton, consistent with diffuse metastatic disease. Primary tumor is not specified. 2. Uptake in the proximal right femur, consistent with lytic lesions. 3. Uptake in the ribs bilaterally as described. 4. Lesions in the proximal left humerus. 5. Diffuse uptake throughout the skull consistent with metastatic disease. 6. Right paramedian uptake at the manubrium.  12/13/18 CT Right Femur revealed Numerous lytic lesions involving the right femur and a lytic lesion in the left inferior pubic ramus. Overall appearance is most concerning for multiple myeloma  12/27/18 Pretreatment 24hour UPEP observed an M spike at 18mg , and showed 199mg  total protein/day.  12/27/18 Pretreatment MMP revealed M Protein at 0.5g with IgG Lambda specificity. Kappa:Lambda light chain ratio at 0.13, with Lambda at 40.3. There is less abnormal protein and light chains than I would expect from 30% plasma cells, which suggests hypo-secretory or non-secretory neoplastic plasma cells. Will have an impact in assessing response. 01/05/19 PET/CT revealed Innumerable lytic lesions in the skeleton compatible with myeloma. Most of the larger  lesions are hypermetabolic, for example including a left proximal humeral shaft lesion with maximum SUV of 8.1 and a 2.8 cm lesion in the left T9 vertebral body with maximum SUV 5.1. Most of the smaller lytic lesions, and some of the larger lesions, do not demonstrate accentuated metabolic activity. 2. 1.2 cm in short axis lymph node in the left parapharyngeal space is hypermetabolic with maximum SUV 11.8. I do not see a separate mass in the head and neck to give rise to this hypermetabolic lymph node. 3. Mosaic attenuation in the lower lobes, nonspecific possibly from air trapping. 4.  Aortic Atherosclerosis 5. Heterogeneous activity in the liver, making it hard to exclude small liver lesions. Consider hepatic protocol MRI with and without contrast for definitive assessment. Nonobstructive right nephrolithiasis. Old granulomatous disease  01/06/19 Bone Marrow biopsy revealed interstitial increase in plasma cells (28% aspirate, 40% CD138 immunohistochemistry). Plasma cells negative for light chains  consistent with a non or weakly secretory myeloma   01/06/19 Cytogenetics revealed 37% of cells with trisomy 11 or 11q deletion, and 40.5% of cells with 17p mutation  S/p 5 cycles of KRD treatment  05/31/19 BM Biopsy revealed mild atypical plasmacytosis at 5% with polytypic variation.   06/01/19 PET/CT revealed "Dominant lesion in the LEFT humerus is decreased significantly in metabolic activity. Additional hypermetabolic skeletal lytic lesions have decreased in metabolic activity or similar to comparison exam (01/05/2019). No evidence of disease progression. 2. Multiple additional lytic lesions do not have metabolic activity and unchanged. 3. No new skeletal lesions are identified. No soft tissue plasmacytoma identified. 4. Nodule / node in the LEFT parapharyngeal space which is intensely hypermetabolic not changed from prior. 5. New hypermetabolic LEFT lower lobe pulmonary nodule is indeterminate. Recommend close  attention on follow-up 6. New obstructive hydronephrosis of the RIGHT kidney related to RIGHT UPJ stone."  09/18/2019 BM Bx Report which revealed "Slightly hypercellular bone marrow for age with trilineage hematopoiesis and 1% plasma cells."  09/14/2019 PET/CT Whole Body Scan ([id number]) which revealed "1. There widespread tiny lytic lesions compatible with multiple myeloma. Index larger lesions are generally similar to the prior exam, with low-grade activity such as the left T9 vertebral body lesion with maximum SUV 4.5. Is mild increase in the activity associated with a mildly sclerotic left proximal humeral lesion, maximum SUV 4.8 (previously 3.5). 2. At the site of the prior left lower lobe nodule is currently more bandlike thickening, with maximum SUV only 1.9, probably benign, continued surveillance of this region suggested. 3. There several small but hypermetabolic lymph nodes. This includes a left parapharyngeal space node measuring 1.0 cm with maximum SUV 12.3 (stable); a left level IB lymph node measuring 0.5 cm with maximum SUV 4.8 (slightly larger than prior); and a left inguinal lymph node measuring 0.7 cm in short axis with maximum SUV 6.4 (previously 0.5 cm with maximum SUV 0.6). Significance of these lymph nodes uncertain, surveillance is recommended. 4. New 5 mm left lower lobe subpleural nodule on image 32/8, not appreciably hypermetabolic, surveillance suggested. 5. Focal subcutaneous stranding along the left perineum measuring about 2.6 by 1.1 cm on image 221/4, maximum SUV 12.5. This was not present previously and is most likely inflammatory, although given the notable SUV, surveillance of this region is suggested. 6. Other imaging findings of potential clinical significance: Aortic Atherosclerosis (ICD10-I70.0). Coronary atherosclerosis. Old granulomatous disease. Mild right hydronephrosis due to a 7 mm right UPJ calculus. 2 mm right kidney upper pole nonobstructive renal calculus.  Prominent stool throughout the colon favors constipation."  12/19/2019 Thoracic & Lumbar Spine MRI ([id number]) ([id number]) revealed "Suspected myeloma lesions at T9 and S1. No compression deformity or epidural disease.  01/18/2020 PET/CT ([id number]) which revealed "1. Stable lytic lesions throughout the skeleton. The larger lytic lesions which had mild metabolic activity on comparison exam now have background metabolic activity. No evidence of active myeloma. No evidence of progression multiple myeloma.  No plasmacytoma 3. Hypermetabolic nodules in the LEFT neck may be associated deep tissues of the LEFT parotid gland. 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  01/17/19 MRI Liver revealed Several appreciable liver lesions all have benign imaging characteristics. No MRI findings of metastatic involvement of the liver. 2. Scattered bony lesions corresponding to  the lytic lesions seen at PET-CT, compatible with active myeloma. 3. Aortic Atherosclerosis.  Mild cardiomegaly. 4. Diffuse hepatic steatosis.   3. Left lower lobe pulmonary nodule First seen on 06/01/19 PET/CT PET/CT 08/27/2020: No hypermetabolic mediastinal or hilar nodes. No suspicious pulmonary nodules on the CT scan.  4. 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 Being managed conservatively as per patient's preference.  No symptoms from this currently.  5. Mild chronic kidney disease CMP done 04/09/2022 showed creatinine of 1.17 and GFR est of 48 consistent with mild chronic kidney disease.  PLAN:  -Discussed lab results on 02/11/2024 in detail with patient. CBC showed WBC of 4.6K, hemoglobin of 12.9, and  platelets of 182K. -her PET scan from 01/24/2024 showed: 1. No signs of metabolically active lesions of multiple myeloma. 2. Persistent tracer avid left parapharyngeal soft tissue nodule/node and left level 2 lymph node. These are unchanged in size and degree of tracer uptake.  3. Stable appearance of lucent bone lesions without increased radiotracer uptake compatible with treated disease. 4.  Aortic Atherosclerosis (ICD10-I70.0). -continue myeloma treatment -discussed option to proceed with MRI scheduled for tomorrow to further evaluate degenerative changes if her pain is persistent.  -patient is inclined to hold off on MRI at this time -discussed that if there is bothersome inflammation, there may be a role for orthopedics to inject steroids into the area, which patient is not inclined to consider at this time -advised patient to avoid strenous activity involving the back -discussed option of trying pool therapy/water aerobics to take pressure off of the back -discussed option to use OTC local voltaren gel  -recommend using SI joint brace -okay to try cutting down fentanyl patch dose to one patch daily -okay to use muscle relaxant as needed -discussed option to use OTC local lidocaine patch for 12 hours at time -answered all of patient's questions in detail today -patient shall return to clinic in 1 month  FOLLOW UP: ***  The total time spent in the appointment was *** minutes* .  All of the patient's questions were answered with apparent satisfaction. The patient knows to call the clinic with any problems, questions or concerns.   Wyvonnia Lora MD MS AAHIVMS [facility] Hematology/Oncology Physician [facility]  .*Total Encounter Time as defined by the Centers for Medicare and Medicaid Services includes, in addition to the face-to-face time of a patient visit (documented in the note above) non-face-to-face time: obtaining and reviewing outside history, ordering and reviewing  medications, tests or procedures, care coordination (communications with other health care professionals or caregivers) and documentation in the medical record.   I,Mitra Faeizi,acting as a Neurosurgeon for Wyvonnia Lora, MD.,have documented all relevant documentation on the behalf of Wyvonnia Lora, MD,as directed by  Wyvonnia Lora, MD while in the presence of Wyvonnia Lora, MD.  ***

## 2024-02-11 ENCOUNTER — Inpatient Hospital Stay: Payer: Medicare PPO

## 2024-02-11 ENCOUNTER — Other Ambulatory Visit: Payer: Self-pay | Admitting: Hematology

## 2024-02-11 ENCOUNTER — Inpatient Hospital Stay: Payer: Medicare PPO | Admitting: Hematology

## 2024-02-11 VITALS — BP 106/52 | HR 65 | Resp 16

## 2024-02-11 VITALS — BP 127/52 | HR 78 | Temp 97.1°F | Resp 16 | Ht 59.0 in | Wt 150.5 lb

## 2024-02-11 DIAGNOSIS — C9000 Multiple myeloma not having achieved remission: Secondary | ICD-10-CM

## 2024-02-11 DIAGNOSIS — Z7189 Other specified counseling: Secondary | ICD-10-CM

## 2024-02-11 DIAGNOSIS — Z95828 Presence of other vascular implants and grafts: Secondary | ICD-10-CM

## 2024-02-11 DIAGNOSIS — Z79899 Other long term (current) drug therapy: Secondary | ICD-10-CM | POA: Diagnosis not present

## 2024-02-11 DIAGNOSIS — Z5112 Encounter for antineoplastic immunotherapy: Secondary | ICD-10-CM | POA: Diagnosis not present

## 2024-02-11 LAB — CMP (CANCER CENTER ONLY)
ALT: 14 U/L (ref 0–44)
AST: 12 U/L — ABNORMAL LOW (ref 15–41)
Albumin: 3.8 g/dL (ref 3.5–5.0)
Alkaline Phosphatase: 102 U/L (ref 38–126)
Anion gap: 9 (ref 5–15)
BUN: 22 mg/dL (ref 8–23)
CO2: 25 mmol/L (ref 22–32)
Calcium: 9.8 mg/dL (ref 8.9–10.3)
Chloride: 106 mmol/L (ref 98–111)
Creatinine: 0.93 mg/dL (ref 0.44–1.00)
GFR, Estimated: >60 mL/min (ref >60)
Glucose, Bld: 276 mg/dL — ABNORMAL HIGH (ref 70–99)
Potassium: 3.8 mmol/L (ref 3.5–5.1)
Sodium: 140 mmol/L (ref 135–145)
Total Bilirubin: 0.6 mg/dL (ref 0.0–1.2)
Total Protein: 6.1 g/dL — ABNORMAL LOW (ref 6.5–8.1)

## 2024-02-11 LAB — CBC WITH DIFFERENTIAL (CANCER CENTER ONLY)
Abs Immature Granulocytes: 0.02 10*3/uL (ref 0.00–0.07)
Basophils Absolute: 0.1 10*3/uL (ref 0.0–0.1)
Basophils Relative: 2 %
Eosinophils Absolute: 0 10*3/uL (ref 0.0–0.5)
Eosinophils Relative: 1 %
HCT: 38.5 % (ref 36.0–46.0)
Hemoglobin: 12.9 g/dL (ref 12.0–15.0)
Immature Granulocytes: 0 %
Lymphocytes Relative: 28 %
Lymphs Abs: 1.3 10*3/uL (ref 0.7–4.0)
MCH: 33 pg (ref 26.0–34.0)
MCHC: 33.5 g/dL (ref 30.0–36.0)
MCV: 98.5 fL (ref 80.0–100.0)
Monocytes Absolute: 1 10*3/uL (ref 0.1–1.0)
Monocytes Relative: 22 %
Neutro Abs: 2.2 10*3/uL (ref 1.7–7.7)
Neutrophils Relative %: 47 %
Platelet Count: 182 10*3/uL (ref 150–400)
RBC: 3.91 MIL/uL (ref 3.87–5.11)
RDW: 13.5 % (ref 11.5–15.5)
WBC Count: 4.6 10*3/uL (ref 4.0–10.5)
nRBC: 0 % (ref 0.0–0.2)

## 2024-02-11 MED ORDER — SODIUM CHLORIDE 0.9 % IV SOLN: Freq: Once | INTRAVENOUS | Status: AC

## 2024-02-11 MED ORDER — DEXAMETHASONE 4 MG PO TABS
8.0000 mg | ORAL_TABLET | Freq: Once | ORAL | Status: AC
  Administered 2024-02-11: 8 mg via ORAL
  Filled 2024-02-11: qty 2

## 2024-02-11 MED ORDER — HEPARIN SOD (PORK) LOCK FLUSH 100 UNIT/ML IV SOLN
500.0000 [IU] | Freq: Once | INTRAVENOUS | Status: AC | PRN
  Administered 2024-02-11: 500 [IU]

## 2024-02-11 MED ORDER — PROCHLORPERAZINE MALEATE 10 MG PO TABS
10.0000 mg | ORAL_TABLET | Freq: Once | ORAL | Status: AC
  Administered 2024-02-11: 10 mg via ORAL
  Filled 2024-02-11: qty 1

## 2024-02-11 MED ORDER — SODIUM CHLORIDE 0.9% FLUSH
10.0000 mL | INTRAVENOUS | Status: DC | PRN
  Administered 2024-02-11: 10 mL

## 2024-02-11 MED ORDER — SODIUM CHLORIDE 0.9% FLUSH
10.0000 mL | Freq: Once | INTRAVENOUS | Status: AC
  Administered 2024-02-11: 10 mL

## 2024-02-11 MED ORDER — FAMOTIDINE IN NACL 20-0.9 MG/50ML-% IV SOLN
20.0000 mg | Freq: Once | INTRAVENOUS | Status: AC
  Administered 2024-02-11: 20 mg via INTRAVENOUS
  Filled 2024-02-11: qty 50

## 2024-02-11 MED ORDER — ACETAMINOPHEN 325 MG PO TABS
650.0000 mg | ORAL_TABLET | Freq: Once | ORAL | Status: AC
  Administered 2024-02-11: 650 mg via ORAL
  Filled 2024-02-11: qty 2

## 2024-02-11 MED ORDER — DIPHENHYDRAMINE HCL 25 MG PO CAPS
25.0000 mg | ORAL_CAPSULE | Freq: Once | ORAL | Status: AC
  Administered 2024-02-11: 25 mg via ORAL
  Filled 2024-02-11: qty 1

## 2024-02-11 MED ORDER — DEXTROSE 5 % IV SOLN
36.0000 mg/m2 | Freq: Once | INTRAVENOUS | Status: AC
  Administered 2024-02-11: 60 mg via INTRAVENOUS
  Filled 2024-02-11: qty 30

## 2024-02-11 NOTE — Progress Notes (Signed)
 Patient seen by Dr. Addison Naegeli are within treatment parameters.  Labs reviewed: and are within treatment parameters.  Per physician team, patient is ready for treatment and there are NO modifications to the treatment plan.

## 2024-02-11 NOTE — Patient Instructions (Signed)
 [facility] - A [facility]   Discharge Instructions: Thank you for choosing [facility] to provide your oncology and hematology care.   If you have a lab appointment with the Cancer Center, please go directly to the Cancer Center and check in at the registration area.   Wear comfortable clothing and clothing appropriate for easy access to any Portacath or PICC line.   We strive to give you quality time with your provider. You may need to reschedule your appointment if you arrive late (15 or more minutes).  Arriving late affects you and other patients whose appointments are after yours.  Also, if you miss three or more appointments without notifying the office, you may be dismissed from the clinic at the provider's discretion.      For prescription refill requests, have your pharmacy contact our office and allow 72 hours for refills to be completed.    Today you received the following chemotherapy and/or immunotherapy agents: Carfilzomib (Kyprolis)      To help prevent nausea and vomiting after your treatment, we encourage you to take your nausea medication as directed.  BELOW ARE SYMPTOMS THAT SHOULD BE REPORTED IMMEDIATELY: *FEVER GREATER THAN 100.4 F (38 C) OR HIGHER *CHILLS OR SWEATING *NAUSEA AND VOMITING THAT IS NOT CONTROLLED WITH YOUR NAUSEA MEDICATION *UNUSUAL SHORTNESS OF BREATH *UNUSUAL BRUISING OR BLEEDING *URINARY PROBLEMS (pain or burning when urinating, or frequent urination) *BOWEL PROBLEMS (unusual diarrhea, constipation, pain near the anus) TENDERNESS IN MOUTH AND THROAT WITH OR WITHOUT PRESENCE OF ULCERS (sore throat, sores in mouth, or a toothache) UNUSUAL RASH, SWELLING OR PAIN  UNUSUAL VAGINAL DISCHARGE OR ITCHING   Items with * indicate a potential emergency and should be followed up as soon as possible or go to the Emergency Department if any problems should occur.  Please show the CHEMOTHERAPY ALERT CARD or  IMMUNOTHERAPY ALERT CARD at check-in to the Emergency Department and triage nurse.  Should you have questions after your visit or need to cancel or reschedule your appointment, please contact [facility]  Dept: [phone]  and follow the prompts.  Office hours are 8:00 a.m. to 4:30 p.m. Monday - Friday. Please note that voicemails left after 4:00 p.m. may not be returned until the following business day.  We are closed weekends and major holidays. You have access to a nurse at all times for urgent questions. Please call the main number to the clinic Dept: [phone] and follow the prompts.   For any non-urgent questions, you may also contact your provider using MyChart. We now offer e-Visits for anyone 55 and older to request care online for non-urgent symptoms. For details visit [email or web address].   Also download the MyChart app! Go to the app store, search "MyChart", open the app, select [place], and log in with your MyChart username and password.

## 2024-02-12 ENCOUNTER — Ambulatory Visit (HOSPITAL_COMMUNITY): Payer: Medicare PPO

## 2024-02-17 ENCOUNTER — Encounter: Payer: Self-pay | Admitting: Hematology

## 2024-02-17 MED ORDER — FENTANYL 12 MCG/HR TD PT72: 1.0000 | MEDICATED_PATCH | TRANSDERMAL | 0 refills | Status: DC

## 2024-02-22 ENCOUNTER — Other Ambulatory Visit: Payer: Self-pay | Admitting: Hematology

## 2024-02-22 ENCOUNTER — Other Ambulatory Visit: Payer: Self-pay

## 2024-02-22 DIAGNOSIS — C9000 Multiple myeloma not having achieved remission: Secondary | ICD-10-CM

## 2024-02-22 MED ORDER — LENALIDOMIDE 5 MG PO CAPS: ORAL_CAPSULE | ORAL | 0 refills | Status: DC

## 2024-02-25 ENCOUNTER — Inpatient Hospital Stay: Payer: Medicare PPO

## 2024-02-25 ENCOUNTER — Inpatient Hospital Stay: Payer: Medicare PPO | Attending: Hematology

## 2024-02-25 VITALS — BP 122/57 | HR 73 | Resp 16

## 2024-02-25 DIAGNOSIS — Z7189 Other specified counseling: Secondary | ICD-10-CM

## 2024-02-25 DIAGNOSIS — C9000 Multiple myeloma not having achieved remission: Secondary | ICD-10-CM

## 2024-02-25 DIAGNOSIS — Z79899 Other long term (current) drug therapy: Secondary | ICD-10-CM | POA: Insufficient documentation

## 2024-02-25 DIAGNOSIS — Z5112 Encounter for antineoplastic immunotherapy: Secondary | ICD-10-CM | POA: Diagnosis not present

## 2024-02-25 DIAGNOSIS — Z95828 Presence of other vascular implants and grafts: Secondary | ICD-10-CM

## 2024-02-25 LAB — CBC WITH DIFFERENTIAL (CANCER CENTER ONLY)
Abs Immature Granulocytes: 0.02 10*3/uL (ref 0.00–0.07)
Basophils Absolute: 0 10*3/uL (ref 0.0–0.1)
Basophils Relative: 1 %
Eosinophils Absolute: 0.2 10*3/uL (ref 0.0–0.5)
Eosinophils Relative: 4 %
HCT: 38.3 % (ref 36.0–46.0)
Hemoglobin: 12.8 g/dL (ref 12.0–15.0)
Immature Granulocytes: 0 %
Lymphocytes Relative: 22 %
Lymphs Abs: 1.1 10*3/uL (ref 0.7–4.0)
MCH: 33.9 pg (ref 26.0–34.0)
MCHC: 33.4 g/dL (ref 30.0–36.0)
MCV: 101.3 fL — ABNORMAL HIGH (ref 80.0–100.0)
Monocytes Absolute: 1.2 10*3/uL — ABNORMAL HIGH (ref 0.1–1.0)
Monocytes Relative: 23 %
Neutro Abs: 2.6 10*3/uL (ref 1.7–7.7)
Neutrophils Relative %: 50 %
Platelet Count: 155 10*3/uL (ref 150–400)
RBC: 3.78 MIL/uL — ABNORMAL LOW (ref 3.87–5.11)
RDW: 14.1 % (ref 11.5–15.5)
WBC Count: 5.2 10*3/uL (ref 4.0–10.5)
nRBC: 0 % (ref 0.0–0.2)

## 2024-02-25 LAB — CMP (CANCER CENTER ONLY)
ALT: 13 U/L (ref 0–44)
AST: 13 U/L — ABNORMAL LOW (ref 15–41)
Albumin: 3.7 g/dL (ref 3.5–5.0)
Alkaline Phosphatase: 107 U/L (ref 38–126)
Anion gap: 7 (ref 5–15)
BUN: 13 mg/dL (ref 8–23)
CO2: 24 mmol/L (ref 22–32)
Calcium: 9.3 mg/dL (ref 8.9–10.3)
Chloride: 109 mmol/L (ref 98–111)
Creatinine: 0.85 mg/dL (ref 0.44–1.00)
GFR, Estimated: >60 mL/min (ref >60)
Glucose, Bld: 238 mg/dL — ABNORMAL HIGH (ref 70–99)
Potassium: 3.8 mmol/L (ref 3.5–5.1)
Sodium: 140 mmol/L (ref 135–145)
Total Bilirubin: 0.7 mg/dL (ref 0.0–1.2)
Total Protein: 5.9 g/dL — ABNORMAL LOW (ref 6.5–8.1)

## 2024-02-25 MED ORDER — ACETAMINOPHEN 325 MG PO TABS
650.0000 mg | ORAL_TABLET | Freq: Once | ORAL | Status: AC
Start: 2024-02-25 — End: 2024-02-25
  Administered 2024-02-25: 650 mg via ORAL
  Filled 2024-02-25: qty 2

## 2024-02-25 MED ORDER — DIPHENHYDRAMINE HCL 25 MG PO CAPS
25.0000 mg | ORAL_CAPSULE | Freq: Once | ORAL | Status: AC
Start: 2024-02-25 — End: 2024-02-25
  Administered 2024-02-25: 25 mg via ORAL
  Filled 2024-02-25: qty 1

## 2024-02-25 MED ORDER — SODIUM CHLORIDE 0.9% FLUSH
10.0000 mL | Freq: Once | INTRAVENOUS | Status: AC
  Administered 2024-02-25: 10 mL

## 2024-02-25 MED ORDER — DEXTROSE 5 % IV SOLN
36.0000 mg/m2 | Freq: Once | INTRAVENOUS | Status: AC
  Administered 2024-02-25: 60 mg via INTRAVENOUS
  Filled 2024-02-25: qty 30

## 2024-02-25 MED ORDER — PROCHLORPERAZINE MALEATE 10 MG PO TABS
10.0000 mg | ORAL_TABLET | Freq: Once | ORAL | Status: AC
Start: 2024-02-25 — End: 2024-02-25
  Administered 2024-02-25: 10 mg via ORAL
  Filled 2024-02-25: qty 1

## 2024-02-25 MED ORDER — HEPARIN SOD (PORK) LOCK FLUSH 100 UNIT/ML IV SOLN
500.0000 [IU] | Freq: Once | INTRAVENOUS | Status: AC | PRN
  Administered 2024-02-25: 500 [IU]

## 2024-02-25 MED ORDER — DEXAMETHASONE 4 MG PO TABS
8.0000 mg | ORAL_TABLET | Freq: Once | ORAL | Status: AC
Start: 2024-02-25 — End: 2024-02-25
  Administered 2024-02-25: 8 mg via ORAL
  Filled 2024-02-25: qty 2

## 2024-02-25 MED ORDER — SODIUM CHLORIDE 0.9% FLUSH
10.0000 mL | INTRAVENOUS | Status: DC | PRN
  Administered 2024-02-25: 10 mL

## 2024-02-25 MED ORDER — SODIUM CHLORIDE 0.9 % IV SOLN: Freq: Once | INTRAVENOUS | Status: AC

## 2024-02-25 MED ORDER — FAMOTIDINE IN NACL 20-0.9 MG/50ML-% IV SOLN
20.0000 mg | Freq: Once | INTRAVENOUS | Status: AC
  Administered 2024-02-25: 20 mg via INTRAVENOUS
  Filled 2024-02-25: qty 50

## 2024-02-25 NOTE — Progress Notes (Signed)
 Per patient, no Zometa today due to needing dental work.

## 2024-02-25 NOTE — Patient Instructions (Signed)
 [facility]  Discharge Instructions: Thank you for choosing [facility] to provide your oncology and hematology care.   If you have a lab appointment with the Cancer Center, please go directly to the Cancer Center and check in at the registration area.   Wear comfortable clothing and clothing appropriate for easy access to any Portacath or PICC line.   We strive to give you quality time with your provider. You may need to reschedule your appointment if you arrive late (15 or more minutes).  Arriving late affects you and other patients whose appointments are after yours.  Also, if you miss three or more appointments without notifying the office, you may be dismissed from the clinic at the provider's discretion.      For prescription refill requests, have your pharmacy contact our office and allow 72 hours for refills to be completed.    Today you received the following chemotherapy and/or immunotherapy agents: Kyrpolis      To help prevent nausea and vomiting after your treatment, we encourage you to take your nausea medication as directed.  BELOW ARE SYMPTOMS THAT SHOULD BE REPORTED IMMEDIATELY: *FEVER GREATER THAN 100.4 F (38 C) OR HIGHER *CHILLS OR SWEATING *NAUSEA AND VOMITING THAT IS NOT CONTROLLED WITH YOUR NAUSEA MEDICATION *UNUSUAL SHORTNESS OF BREATH *UNUSUAL BRUISING OR BLEEDING *URINARY PROBLEMS (pain or burning when urinating, or frequent urination) *BOWEL PROBLEMS (unusual diarrhea, constipation, pain near the anus) TENDERNESS IN MOUTH AND THROAT WITH OR WITHOUT PRESENCE OF ULCERS (sore throat, sores in mouth, or a toothache) UNUSUAL RASH, SWELLING OR PAIN  UNUSUAL VAGINAL DISCHARGE OR ITCHING   Items with * indicate a potential emergency and should be followed up as soon as possible or go to the Emergency Department if any problems should occur.  Please show the CHEMOTHERAPY ALERT CARD or IMMUNOTHERAPY  ALERT CARD at check-in to the Emergency Department and triage nurse.  Should you have questions after your visit or need to cancel or reschedule your appointment, please contact [facility]  Dept: [phone]  and follow the prompts.  Office hours are 8:00 a.m. to 4:30 p.m. Monday - Friday. Please note that voicemails left after 4:00 p.m. may not be returned until the following business day.  We are closed weekends and major holidays. You have access to a nurse at all times for urgent questions. Please call the main number to the clinic Dept: [phone] and follow the prompts.   For any non-urgent questions, you may also contact your provider using MyChart. We now offer e-Visits for anyone 26 and older to request care online for non-urgent symptoms. For details visit [email or web address].   Also download the MyChart app! Go to the app store, search "MyChart", open the app, select [place], and log in with your MyChart username and password.

## 2024-03-07 ENCOUNTER — Encounter: Payer: Self-pay | Admitting: Hematology

## 2024-03-07 ENCOUNTER — Other Ambulatory Visit: Payer: Self-pay

## 2024-03-07 DIAGNOSIS — C9000 Multiple myeloma not having achieved remission: Secondary | ICD-10-CM

## 2024-03-07 MED ORDER — FENTANYL 12 MCG/HR TD PT72: 1.0000 | MEDICATED_PATCH | TRANSDERMAL | 0 refills | Status: DC

## 2024-03-09 NOTE — Progress Notes (Signed)
 HEMATOLOGY/ONCOLOGY CLINIC NOTE  Date of Service: 03/10/2024   Patient Care Team: Myrlene Broker, MD as PCP - General (Internal Medicine) Hart Carwin, MD (Inactive) as Consulting Physician (Gastroenterology) Jerene Bears, MD as Consulting Physician (Gynecology) Marcene Corning, MD as Consulting Physician (Orthopedic Surgery) Waymon Budge, MD as Consulting Physician (Pulmonary Disease) Mateo Flow, MD (Ophthalmology)  CHIEF COMPLAINTS/PURPOSE OF VISIT:  Follow-up for continued evaluation and management of multiple myeloma  HISTORY OF PRESENTING ILLNESS:  Please see previous notes for details on initial presentation  Current Treatment: Carfilzomib + Revlimid maintenance.  INTERVAL HISTORY:   Faith Orr is a 83 y.o. female who is here for continued evaluation and management of the patient's multiple myeloma. She is here to start cycle 14 day 15 of her treatment.   Patient was last seen by me on 02/11/2024 and reported mild discomfort in the right sacral iliac joint and balance issues.  Today, she presents with a walker. Patient complains of intermittent/constant back ache. She reports that she has not seen orthopedics since her last clinical visit.   Patient's current pain regimen with fnetanyl patch and oxycodone is controling her pain reasonably well.  Patient reports that her fentanyl patch dose has decreased. She currently uses a fentanyl patch once every three days, which controls her pain reasonably.   She takes 2 oxycodone tablets daily, with one tablet in the morning and one at night.   She complains of increased dull headache and nausea. Patient's nausea typically presents after standing. She denies any vomiting. She reports that her nasuea is not severe and she uses nausea medication as needed. Patient does stay well-hydrated and generally sleeps well. She denies any concern for allergies or obvious nasal congestion.  Patient reports that  Pantoprazole does control reflux symptoms as needed.  Patinet has been tolerating revlimid and carfilzomib well with no toxicity issiues  She complains of chronic balance issues. Patient denies any SOB or chest pain.   She has not been checking her blood sugar levels at home.   Patient denies any upcoming plans for travel.   MEDICAL HISTORY:  Past Medical History:  Diagnosis Date   Allergy    seasonal   Asthma    DEPRESSION    DIABETES MELLITUS, TYPE II    Diverticulosis    HYPERLIPIDEMIA    Macular degeneration of left eye    mild, Dr.Hecker   Obesity, unspecified    Osteoarthritis of both knees    OSTEOPENIA    Osteopenia    URINARY INCONTINENCE     SURGICAL HISTORY: Past Surgical History:  Procedure Laterality Date   CATARACT EXTRACTION Left 05/24/2018   CESAREAN SECTION  01/1973   CYSTOSCOPY/URETEROSCOPY/HOLMIUM LASER/STENT PLACEMENT Right 09/20/2019   Procedure: CYSTOSCOPY/URETEROSCOPY/HOLMIUM LASER/STENT PLACEMENT;  Surgeon: [name], MD;  Location: [facility];  Service: Urology;  Laterality: Right;   FRACTURE SURGERY     IR BONE MARROW BIOPSY & ASPIRATION  02/16/2023   IR IMAGING GUIDED PORT INSERTION  02/20/2019   left wrist surgery  2008   By Dr. Yisroel Ramming   right ankle  1994    SOCIAL HISTORY: Social History   Socioeconomic History   Marital status: Married    Spouse name: Not on file   Number of children: 1   Years of education: Not on file   Highest education level: Not on file  Occupational History    Employer: [employer]  Tobacco Use   Smoking  status: Never   Smokeless tobacco: Never   Tobacco comments:    Lives with partner [name]) and son  Vaping Use   Vaping status: Never Used  Substance and Sexual Activity   Alcohol use: No    Alcohol/week: 0.0 standard drinks of alcohol   Drug use: No   Sexual activity: Never    Partners: Female    Birth control/protection: Post-menopausal    Comment: Lives  with female partner (annette hicks) and 86 yo son  Other Topics Concern   Not on file  Social History Narrative   Not on file   Social Drivers of Health   Financial Resource Strain: Low Risk  (09/09/2023)   Overall Financial Resource Strain (CARDIA)    Difficulty of Paying Living Expenses: Not hard at all  Food Insecurity: No Food Insecurity (09/09/2023)   Hunger Vital Sign    Worried About Running Out of Food in the Last Year: Never true    Ran Out of Food in the Last Year: Never true  Transportation Needs: No Transportation Needs (09/09/2023)   PRAPARE - Administrator, Civil Service (Medical): No    Lack of Transportation (Non-Medical): No  Physical Activity: Inactive (09/09/2023)   Exercise Vital Sign    Days of Exercise per Week: 0 days    Minutes of Exercise per Session: 0 min  Stress: No Stress Concern Present (09/09/2023)   [employer] of Occupational Health - Occupational Stress Questionnaire    Feeling of Stress : Not at all  Social Connections: Socially Integrated (09/09/2023)   Social Connection and Isolation Panel [NHANES]    Frequency of Communication with Friends and Family: More than three times a week    Frequency of Social Gatherings with Friends and Family: More than three times a week    Attends Religious Services: More than 4 times per year    Active Member of [employer] or Organizations: Yes    Attends Engineer, structural: More than 4 times per year    Marital Status: Married  Catering manager Violence: Not At Risk (05/08/2021)   Humiliation, Afraid, Rape, and Kick questionnaire    Fear of Current or Ex-Partner: No    Emotionally Abused: No    Physically Abused: No    Sexually Abused: No    FAMILY HISTORY: Family History  Problem Relation Age of Onset   Diabetes Father    Hyperlipidemia Father    Heart disease Father    Cancer Father    Hypertension Father    Colon cancer Paternal Grandmother 69   Osteoporosis Mother    Protein  S deficiency Mother    Hyperlipidemia Mother    Multiple sclerosis Daughter    Cancer Other        bladder   Breast cancer Neg Hx     ALLERGIES:  is allergic to levofloxacin, penicillins, aleve [naproxen sodium], and sulfonamide derivatives.  MEDICATIONS:  Current Outpatient Medications  Medication Sig Dispense Refill   acyclovir (ZOVIRAX) 400 MG tablet TAKE 1 TABLET(400 MG) BY MOUTH TWICE DAILY 60 tablet 5   Blood Glucose Monitoring Suppl (FREESTYLE FREEDOM LITE) W/DEVICE KIT Use to check blood sugars twice a day Dx 250.00 1 each 0   Calcium Carbonate-Vitamin D 600-400 MG-UNIT tablet Take 1 tablet by mouth 2 (two) times daily.     dexamethasone (DECADRON) 4 MG tablet Take 5 tablets (20 mg total) by mouth once a week. On D22 of each cycle of treatment 20 tablet 5  ELIQUIS 2.5 MG TABS tablet TAKE 1 TABLET(2.5 MG) BY MOUTH TWICE DAILY 60 tablet 2   fentaNYL (DURAGESIC) 12 MCG/HR Place 1 patch onto the skin every 3 (three) days. 15 patch 0   fluticasone (FLONASE) 50 MCG/ACT nasal spray Place 1 spray into both nostrils daily. 48 g 3   gabapentin (NEURONTIN) 100 MG capsule Take 1 capsule (100 mg total) by mouth 2 (two) times daily. 60 capsule 0   glucose blood (FREESTYLE LITE) test strip CHECK BLOOD SUGAR TWICE DAILY AS DIRECTED Dx 250.00 180 each 3   Lancets (FREESTYLE) lancets Use twice daily to check sugars. 100 each 11   lenalidomide (REVLIMID) 5 MG capsule TAKE 1 CAPSULE BY MOUTH DAILY  FOR 21 DAYS, THEN 7 DAYS OFF 21 capsule 0   lidocaine (LIDODERM) 5 % Place 1 patch onto the skin daily. Remove & Discard patch within 12 hours or as directed by MD 30 patch 0   lidocaine-prilocaine (EMLA) cream APPLY 1 APPLICATION TO THE AFFECTED AREA AS NEEDED. USE PRIOR TO PORT ACCESS 30 g 0   meclizine (ANTIVERT) 25 MG tablet Take 1 tablet (25 mg total) by mouth every 8 (eight) hours as needed for dizziness. 20 tablet 0   metFORMIN (GLUCOPHAGE-XR) 500 MG 24 hr tablet Take 1 tablet (500 mg total) by  mouth 2 (two) times daily with a meal. 180 tablet 3   metroNIDAZOLE (METROGEL) 1 % gel Apply topically in the morning and at bedtime. 60 g 1   Multiple Vitamins-Minerals (ICAPS) CAPS Take 1 capsule by mouth daily after breakfast.     ondansetron (ZOFRAN) 8 MG tablet Take 1 tablet (8 mg total) by mouth 2 (two) times daily as needed (Nausea or vomiting). 30 tablet 1   Oxycodone HCl 10 MG TABS Take 1 tablet (10 mg total) by mouth every 6 (six) hours as needed. 90 tablet 0   pantoprazole (PROTONIX) 20 MG tablet TAKE 1 TABLET(20 MG) BY MOUTH DAILY 30 tablet 2   polyethylene glycol (MIRALAX / GLYCOLAX) packet Take 17 g by mouth daily after breakfast.     potassium chloride SA (KLOR-CON M) 20 MEQ tablet TAKE 2 TABLETS(40 MEQ) BY MOUTH TWICE DAILY 360 tablet 1   prochlorperazine (COMPAZINE) 10 MG tablet Take 1 tablet (10 mg total) by mouth every 6 (six) hours as needed (Nausea or vomiting). 30 tablet 1   senna-docusate (SENNA S) 8.6-50 MG tablet Take 2 tablets by mouth at bedtime. 60 tablet 2   sertraline (ZOLOFT) 100 MG tablet Take 1 tablet (100 mg total) by mouth daily. 90 tablet 3   simvastatin (ZOCOR) 20 MG tablet Take 1 tablet (20 mg total) by mouth daily at 6 PM. 90 tablet 3   tizanidine (ZANAFLEX) 2 MG capsule Take 1 capsule (2 mg total) by mouth in the morning and at bedtime. 20 capsule 0   Vitamin D, Ergocalciferol, (DRISDOL) 1.25 MG (50000 UNIT) CAPS capsule Take 1 capsule (50,000 Units total) by mouth every 7 (seven) days. 12 capsule 0   No current facility-administered medications for this visit.   Facility-Administered Medications Ordered in Other Visits  Medication Dose Route Frequency Provider Last Rate Last Admin   heparin lock flush 100 unit/mL  500 Units Intracatheter Once PRN Johney Maine, MD       sodium chloride flush (NS) 0.9 % injection 10 mL  10 mL Intracatheter PRN Johney Maine, MD   10 mL at 12/17/23 1336    REVIEW OF SYSTEMS:    10  Point review of Systems  was done is negative except as noted above.   PHYSICAL EXAMINATION: ECOG FS:2 - Symptomatic, <50% confined to bed  .LMP 10/09/2012    GENERAL:alert, in no acute distress and comfortable SKIN: no acute rashes, no significant lesions EYES: conjunctiva are pink and non-injected, sclera anicteric OROPHARYNX: MMM, no exudates, no oropharyngeal erythema or ulceration NECK: supple, no JVD LYMPH:  no palpable lymphadenopathy in the cervical, axillary or inguinal regions LUNGS: clear to auscultation b/l with normal respiratory effort HEART: regular rate & rhythm ABDOMEN:  normoactive bowel sounds , non tender, not distended. Extremity: no pedal edema PSYCH: alert & oriented x 3 with fluent speech NEURO: no focal motor/sensory deficits   LABS .    Latest Ref Rng & Units 02/25/2024    1:23 PM 02/11/2024    8:46 AM 01/28/2024    1:22 PM  CBC  WBC 4.0 - 10.5 K/uL 5.2  4.6  3.8   Hemoglobin 12.0 - 15.0 g/dL 78.2  95.6  21.3   Hematocrit 36.0 - 46.0 % 38.3  38.5  38.4   Platelets 150 - 400 K/uL 155  182  143    .    Latest Ref Rng & Units 02/25/2024    1:23 PM 02/11/2024    8:46 AM 01/28/2024    1:22 PM  CMP  Glucose 70 - 99 mg/dL 086  578  469   BUN 8 - 23 mg/dL 13  22  16    Creatinine 0.44 - 1.00 mg/dL 6.29  5.28  4.13   Sodium 135 - 145 mmol/L 140  140  137   Potassium 3.5 - 5.1 mmol/L 3.8  3.8  4.3   Chloride 98 - 111 mmol/L 109  106  103   CO2 22 - 32 mmol/L 24  25  28    Calcium 8.9 - 10.3 mg/dL 9.3  9.8  9.8   Total Protein 6.5 - 8.1 g/dL 5.9  6.1  5.7   Total Bilirubin 0.0 - 1.2 mg/dL 0.7  0.6  0.5   Alkaline Phos 38 - 126 U/L 107  102  70   AST 15 - 41 U/L 13  12  11    ALT 0 - 44 U/L 13  14  18       09/18/2019 BM Bx Report ([id number])   09/18/2019 FISH Panel    05/30/2019 BM Bx   01/06/2019 BM Bx:     01/06/19 Cytogenetics:      05/30/19 BM Biopsy:   09/18/2019 FISH Panel    09/18/2019 BM Surgical Pathology ([id number])     RADIOGRAPHIC  STUDIES: I have personally reviewed the radiological images as listed and agreed with the findings in the report. No results found.    ASSESSMENT & PLAN:   83 y.o. female with  1.  Nonsecretory multiple Myeloma, RISS Stage III  Labs upon initial presentation from 12/08/18, blood counts are normal including WBC at 7.1k, HGB at 13.1, and PLT at 245k. Calcium normal at 10.3. Creatinine normal at 0.63. M spike at 0.5g. 12/13/18 Bone Scan revealed Multifocal uptake throughout the skeleton, consistent with diffuse metastatic disease. Primary tumor is not specified. 2. Uptake in the proximal right femur, consistent with lytic lesions. 3. Uptake in the ribs bilaterally as described. 4. Lesions in the proximal left humerus. 5. Diffuse uptake throughout the skull consistent with metastatic disease. 6. Right paramedian uptake at the manubrium.  12/13/18 CT Right Femur revealed Numerous lytic lesions involving the right femur and  a lytic lesion in the left inferior pubic ramus. Overall appearance is most concerning for multiple myeloma  12/27/18 Pretreatment 24hour UPEP observed an M spike at 18mg , and showed 199mg  total protein/day.  12/27/18 Pretreatment MMP revealed M Protein at 0.5g with IgG Lambda specificity. Kappa:Lambda light chain ratio at 0.13, with Lambda at 40.3. There is less abnormal protein and light chains than I would expect from 30% plasma cells, which suggests hypo-secretory or non-secretory neoplastic plasma cells. Will have an impact in assessing response. 01/05/19 PET/CT revealed Innumerable lytic lesions in the skeleton compatible with myeloma. Most of the larger lesions are hypermetabolic, for example including a left proximal humeral shaft lesion with maximum SUV of 8.1 and a 2.8 cm lesion in the left T9 vertebral body with maximum SUV 5.1. Most of the smaller lytic lesions, and some of the larger lesions, do not demonstrate accentuated metabolic activity. 2. 1.2 cm in short axis lymph  node in the left parapharyngeal space is hypermetabolic with maximum SUV 11.8. I do not see a separate mass in the head and neck to give rise to this hypermetabolic lymph node. 3. Mosaic attenuation in the lower lobes, nonspecific possibly from air trapping. 4.  Aortic Atherosclerosis 5. Heterogeneous activity in the liver, making it hard to exclude small liver lesions. Consider hepatic protocol MRI with and without contrast for definitive assessment. Nonobstructive right nephrolithiasis. Old granulomatous disease  01/06/19 Bone Marrow biopsy revealed interstitial increase in plasma cells (28% aspirate, 40% CD138 immunohistochemistry). Plasma cells negative for light chains consistent with a non or weakly secretory myeloma   01/06/19 Cytogenetics revealed 37% of cells with trisomy 11 or 11q deletion, and 40.5% of cells with 17p mutation  S/p 5 cycles of KRD treatment  05/31/19 BM Biopsy revealed mild atypical plasmacytosis at 5% with polytypic variation.   06/01/19 PET/CT revealed "Dominant lesion in the LEFT humerus is decreased significantly in metabolic activity. Additional hypermetabolic skeletal lytic lesions have decreased in metabolic activity or similar to comparison exam (01/05/2019). No evidence of disease progression. 2. Multiple additional lytic lesions do not have metabolic activity and unchanged. 3. No new skeletal lesions are identified. No soft tissue plasmacytoma identified. 4. Nodule / node in the LEFT parapharyngeal space which is intensely hypermetabolic not changed from prior. 5. New hypermetabolic LEFT lower lobe pulmonary nodule is indeterminate. Recommend close attention on follow-up 6. New obstructive hydronephrosis of the RIGHT kidney related to RIGHT UPJ stone."  09/18/2019 BM Bx Report which revealed "Slightly hypercellular bone marrow for age with trilineage hematopoiesis and 1% plasma cells."  09/14/2019 PET/CT Whole Body Scan ([id number]) which revealed "1. There widespread  tiny lytic lesions compatible with multiple myeloma. Index larger lesions are generally similar to the prior exam, with low-grade activity such as the left T9 vertebral body lesion with maximum SUV 4.5. Is mild increase in the activity associated with a mildly sclerotic left proximal humeral lesion, maximum SUV 4.8 (previously 3.5). 2. At the site of the prior left lower lobe nodule is currently more bandlike thickening, with maximum SUV only 1.9, probably benign, continued surveillance of this region suggested. 3. There several small but hypermetabolic lymph nodes. This includes a left parapharyngeal space node measuring 1.0 cm with maximum SUV 12.3 (stable); a left level IB lymph node measuring 0.5 cm with maximum SUV 4.8 (slightly larger than prior); and a left inguinal lymph node measuring 0.7 cm in short axis with maximum SUV 6.4 (previously 0.5 cm with maximum SUV 0.6). Significance of these lymph nodes  uncertain, surveillance is recommended. 4. New 5 mm left lower lobe subpleural nodule on image 32/8, not appreciably hypermetabolic, surveillance suggested. 5. Focal subcutaneous stranding along the left perineum measuring about 2.6 by 1.1 cm on image 221/4, maximum SUV 12.5. This was not present previously and is most likely inflammatory, although given the notable SUV, surveillance of this region is suggested. 6. Other imaging findings of potential clinical significance: Aortic Atherosclerosis (ICD10-I70.0). Coronary atherosclerosis. Old granulomatous disease. Mild right hydronephrosis due to a 7 mm right UPJ calculus. 2 mm right kidney upper pole nonobstructive renal calculus. Prominent stool throughout the colon favors constipation."  12/19/2019 Thoracic & Lumbar Spine MRI ([id number]) ([id number]) revealed "Suspected myeloma lesions at T9 and S1. No compression deformity or epidural disease.  01/18/2020 PET/CT ([id number]) which revealed "1. Stable lytic lesions throughout the skeleton. The larger  lytic lesions which had mild metabolic activity on comparison exam now have background metabolic activity. No evidence of active myeloma. No evidence of progression multiple myeloma.  No plasmacytoma 3. Hypermetabolic nodules in the LEFT neck may be associated deep tissues of the LEFT parotid gland. 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  01/17/19 MRI Liver revealed Several appreciable liver lesions all have benign imaging characteristics. No MRI findings of metastatic involvement of the liver. 2. Scattered bony lesions corresponding to the lytic lesions seen at PET-CT, compatible with active myeloma. 3. Aortic Atherosclerosis.  Mild cardiomegaly. 4. Diffuse hepatic steatosis.   3. Left lower lobe pulmonary nodule First seen on 06/01/19 PET/CT PET/CT 08/27/2020: No hypermetabolic mediastinal or hilar nodes. No suspicious pulmonary nodules on the CT scan.  4. 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 Being managed conservatively as per patient's preference.  No symptoms from this currently.  5. Mild chronic kidney disease CMP done 04/09/2022 showed creatinine of 1.17 and GFR est of 48 consistent with mild chronic kidney disease.  PLAN:  -Discussed lab results on 03/10/2024 in detail with patient. CBC stable, showed WBC of 5.5K, hemoglobin of 12.5, and platelets of 213K. -blood sugars are mildly high at 280 mg/dL; CMP otherwise stable -discussed that her recent PET scan showed no active disease in the bone -Discussed that her recent scan showed  Inflammation in the sacral iliac joint as well as other degenerative changes -discussed option to connect with orthopedics to  consider steroid injections into an area of inflammation such as the sacral iliac joint -patient is not inclined to connect with orthopedics for consideration of steroid shot injection at this time -discussed that a fentanyl patch is continuous for long-duration control while Oxycodone tablet is for any breakthrough pain in addition -continue lower dose fentanyl patch. Discussed that if her pain settles, then there may be a role for only taking oxycodone as needed -continue Oxycodone -continue acyclovir for shingles prevention -would recommend patient to receive the shingles vaccine -discussed that it would still be recommended to continue acyclovir even after receiving shingles vaccine -okay to take Pantoprazole only as needed if her reflux is generally controlled with dietary changes -discussed that back pain can cause changes in walking patterns -educated patient that blood sugar fluctuation can casue headache and nausea -advised patient to regularly check her blood sugars at home -answered all of  patient's questions in detail  FOLLOW UP: ***  The total time spent in the appointment was *** minutes* .  All of the patient's questions were answered with apparent satisfaction. The patient knows to call the clinic with any problems, questions or concerns.   Wyvonnia Lora MD MS AAHIVMS [facility] Hematology/Oncology Physician [facility]  .*Total Encounter Time as defined by the Centers for Medicare and Medicaid Services includes, in addition to the face-to-face time of a patient visit (documented in the note above) non-face-to-face time: obtaining and reviewing outside history, ordering and reviewing medications, tests or procedures, care coordination (communications with other health care professionals or caregivers) and documentation in the medical record.    I,Mitra Faeizi,acting as a Neurosurgeon for Wyvonnia Lora, MD.,have documented all relevant documentation on the behalf of  Wyvonnia Lora, MD,as directed by  Wyvonnia Lora, MD while in the presence of Wyvonnia Lora, MD.  ***

## 2024-03-10 ENCOUNTER — Inpatient Hospital Stay: Payer: Medicare PPO

## 2024-03-10 ENCOUNTER — Inpatient Hospital Stay (HOSPITAL_BASED_OUTPATIENT_CLINIC_OR_DEPARTMENT_OTHER): Payer: Medicare PPO | Admitting: Hematology

## 2024-03-10 ENCOUNTER — Encounter: Payer: Self-pay | Admitting: Hematology

## 2024-03-10 VITALS — BP 136/61 | HR 76 | Temp 97.6°F | Resp 19 | Wt 152.0 lb

## 2024-03-10 DIAGNOSIS — C9000 Multiple myeloma not having achieved remission: Secondary | ICD-10-CM | POA: Diagnosis not present

## 2024-03-10 DIAGNOSIS — Z7189 Other specified counseling: Secondary | ICD-10-CM

## 2024-03-10 DIAGNOSIS — Z79899 Other long term (current) drug therapy: Secondary | ICD-10-CM | POA: Diagnosis not present

## 2024-03-10 DIAGNOSIS — Z5111 Encounter for antineoplastic chemotherapy: Secondary | ICD-10-CM

## 2024-03-10 DIAGNOSIS — Z95828 Presence of other vascular implants and grafts: Secondary | ICD-10-CM

## 2024-03-10 DIAGNOSIS — Z5112 Encounter for antineoplastic immunotherapy: Secondary | ICD-10-CM | POA: Diagnosis not present

## 2024-03-10 LAB — CBC WITH DIFFERENTIAL (CANCER CENTER ONLY)
Abs Immature Granulocytes: 0.02 10*3/uL (ref 0.00–0.07)
Basophils Absolute: 0.1 10*3/uL (ref 0.0–0.1)
Basophils Relative: 1 %
Eosinophils Absolute: 0.1 10*3/uL (ref 0.0–0.5)
Eosinophils Relative: 1 %
HCT: 37.6 % (ref 36.0–46.0)
Hemoglobin: 12.5 g/dL (ref 12.0–15.0)
Immature Granulocytes: 0 %
Lymphocytes Relative: 18 %
Lymphs Abs: 1 10*3/uL (ref 0.7–4.0)
MCH: 33.6 pg (ref 26.0–34.0)
MCHC: 33.2 g/dL (ref 30.0–36.0)
MCV: 101.1 fL — ABNORMAL HIGH (ref 80.0–100.0)
Monocytes Absolute: 1.1 10*3/uL — ABNORMAL HIGH (ref 0.1–1.0)
Monocytes Relative: 19 %
Neutro Abs: 3.3 10*3/uL (ref 1.7–7.7)
Neutrophils Relative %: 61 %
Platelet Count: 213 10*3/uL (ref 150–400)
RBC: 3.72 MIL/uL — ABNORMAL LOW (ref 3.87–5.11)
RDW: 13.6 % (ref 11.5–15.5)
WBC Count: 5.5 10*3/uL (ref 4.0–10.5)
nRBC: 0 % (ref 0.0–0.2)

## 2024-03-10 LAB — CMP (CANCER CENTER ONLY)
ALT: 12 U/L (ref 0–44)
AST: 12 U/L — ABNORMAL LOW (ref 15–41)
Albumin: 3.7 g/dL (ref 3.5–5.0)
Alkaline Phosphatase: 92 U/L (ref 38–126)
Anion gap: 9 (ref 5–15)
BUN: 18 mg/dL (ref 8–23)
CO2: 23 mmol/L (ref 22–32)
Calcium: 9.8 mg/dL (ref 8.9–10.3)
Chloride: 105 mmol/L (ref 98–111)
Creatinine: 0.92 mg/dL (ref 0.44–1.00)
GFR, Estimated: >60 mL/min (ref >60)
Glucose, Bld: 280 mg/dL — ABNORMAL HIGH (ref 70–99)
Potassium: 3.7 mmol/L (ref 3.5–5.1)
Sodium: 137 mmol/L (ref 135–145)
Total Bilirubin: 0.6 mg/dL (ref 0.0–1.2)
Total Protein: 6.2 g/dL — ABNORMAL LOW (ref 6.5–8.1)

## 2024-03-10 MED ORDER — SODIUM CHLORIDE 0.9 % IV SOLN: Freq: Once | INTRAVENOUS | Status: DC

## 2024-03-10 MED ORDER — DEXTROSE 5 % IV SOLN
36.0000 mg/m2 | Freq: Once | INTRAVENOUS | Status: AC
  Administered 2024-03-10: 60 mg via INTRAVENOUS
  Filled 2024-03-10: qty 30

## 2024-03-10 MED ORDER — HEPARIN SOD (PORK) LOCK FLUSH 100 UNIT/ML IV SOLN
500.0000 [IU] | Freq: Once | INTRAVENOUS | Status: AC | PRN
  Administered 2024-03-10: 500 [IU]

## 2024-03-10 MED ORDER — PROCHLORPERAZINE MALEATE 10 MG PO TABS
10.0000 mg | ORAL_TABLET | Freq: Once | ORAL | Status: AC
  Administered 2024-03-10: 10 mg via ORAL
  Filled 2024-03-10: qty 1

## 2024-03-10 MED ORDER — ACETAMINOPHEN 325 MG PO TABS
650.0000 mg | ORAL_TABLET | Freq: Once | ORAL | Status: AC
  Administered 2024-03-10: 650 mg via ORAL
  Filled 2024-03-10: qty 2

## 2024-03-10 MED ORDER — SODIUM CHLORIDE 0.9% FLUSH
10.0000 mL | Freq: Once | INTRAVENOUS | Status: AC
  Administered 2024-03-10: 10 mL

## 2024-03-10 MED ORDER — DIPHENHYDRAMINE HCL 25 MG PO CAPS
25.0000 mg | ORAL_CAPSULE | Freq: Once | ORAL | Status: AC
  Administered 2024-03-10: 25 mg via ORAL
  Filled 2024-03-10: qty 1

## 2024-03-10 MED ORDER — SODIUM CHLORIDE 0.9 % IV SOLN: Freq: Once | INTRAVENOUS | Status: AC

## 2024-03-10 MED ORDER — SODIUM CHLORIDE 0.9% FLUSH
10.0000 mL | INTRAVENOUS | Status: DC | PRN
  Administered 2024-03-10: 10 mL

## 2024-03-10 MED ORDER — FAMOTIDINE IN NACL 20-0.9 MG/50ML-% IV SOLN
20.0000 mg | Freq: Once | INTRAVENOUS | Status: AC
  Administered 2024-03-10: 20 mg via INTRAVENOUS
  Filled 2024-03-10: qty 50

## 2024-03-10 MED ORDER — DEXAMETHASONE 4 MG PO TABS
8.0000 mg | ORAL_TABLET | Freq: Once | ORAL | Status: AC
  Administered 2024-03-10: 8 mg via ORAL
  Filled 2024-03-10: qty 2

## 2024-03-10 NOTE — Patient Instructions (Signed)

## 2024-03-10 NOTE — Progress Notes (Signed)
 Patient seen by Dr. Addison Naegeli are within treatment parameters.  Labs reviewed: and are within treatment parameters.  Per physician team, patient is ready for treatment and there are NO modifications to the treatment plan.

## 2024-03-16 ENCOUNTER — Encounter: Payer: Self-pay | Admitting: Hematology

## 2024-03-20 ENCOUNTER — Encounter: Payer: Self-pay | Admitting: Hematology

## 2024-03-20 ENCOUNTER — Other Ambulatory Visit: Payer: Self-pay

## 2024-03-20 DIAGNOSIS — C9000 Multiple myeloma not having achieved remission: Secondary | ICD-10-CM

## 2024-03-21 ENCOUNTER — Encounter: Payer: Self-pay | Admitting: Hematology

## 2024-03-21 MED ORDER — OXYCODONE HCL 10 MG PO TABS
10.0000 mg | ORAL_TABLET | Freq: Four times a day (QID) | ORAL | 0 refills | Status: DC | PRN
Start: 2024-03-21 — End: 2024-05-01

## 2024-03-24 ENCOUNTER — Inpatient Hospital Stay

## 2024-03-24 ENCOUNTER — Other Ambulatory Visit: Payer: Self-pay

## 2024-03-24 DIAGNOSIS — C9000 Multiple myeloma not having achieved remission: Secondary | ICD-10-CM

## 2024-03-24 MED ORDER — LENALIDOMIDE 5 MG PO CAPS: ORAL_CAPSULE | ORAL | 0 refills | Status: DC

## 2024-04-07 ENCOUNTER — Inpatient Hospital Stay: Attending: Hematology

## 2024-04-07 ENCOUNTER — Inpatient Hospital Stay

## 2024-04-07 ENCOUNTER — Other Ambulatory Visit: Payer: Self-pay

## 2024-04-07 ENCOUNTER — Inpatient Hospital Stay: Admitting: Hematology

## 2024-04-07 VITALS — BP 124/61 | HR 73 | Temp 97.9°F | Resp 18 | Wt 153.2 lb

## 2024-04-07 DIAGNOSIS — Z79899 Other long term (current) drug therapy: Secondary | ICD-10-CM | POA: Diagnosis not present

## 2024-04-07 DIAGNOSIS — Z5112 Encounter for antineoplastic immunotherapy: Secondary | ICD-10-CM | POA: Insufficient documentation

## 2024-04-07 DIAGNOSIS — Z7189 Other specified counseling: Secondary | ICD-10-CM

## 2024-04-07 DIAGNOSIS — C9000 Multiple myeloma not having achieved remission: Secondary | ICD-10-CM

## 2024-04-07 DIAGNOSIS — Z5111 Encounter for antineoplastic chemotherapy: Secondary | ICD-10-CM | POA: Diagnosis not present

## 2024-04-07 DIAGNOSIS — Z95828 Presence of other vascular implants and grafts: Secondary | ICD-10-CM

## 2024-04-07 LAB — CBC WITH DIFFERENTIAL (CANCER CENTER ONLY)
Abs Immature Granulocytes: 0.01 10*3/uL (ref 0.00–0.07)
Basophils Absolute: 0.1 10*3/uL (ref 0.0–0.1)
Basophils Relative: 2 %
Eosinophils Absolute: 0.1 10*3/uL (ref 0.0–0.5)
Eosinophils Relative: 2 %
HCT: 36.6 % (ref 36.0–46.0)
Hemoglobin: 12.2 g/dL (ref 12.0–15.0)
Immature Granulocytes: 0 %
Lymphocytes Relative: 25 %
Lymphs Abs: 1 10*3/uL (ref 0.7–4.0)
MCH: 32.9 pg (ref 26.0–34.0)
MCHC: 33.3 g/dL (ref 30.0–36.0)
MCV: 98.7 fL (ref 80.0–100.0)
Monocytes Absolute: 1 10*3/uL (ref 0.1–1.0)
Monocytes Relative: 24 %
Neutro Abs: 1.9 10*3/uL (ref 1.7–7.7)
Neutrophils Relative %: 47 %
Platelet Count: 154 10*3/uL (ref 150–400)
RBC: 3.71 MIL/uL — ABNORMAL LOW (ref 3.87–5.11)
RDW: 13.4 % (ref 11.5–15.5)
WBC Count: 4 10*3/uL (ref 4.0–10.5)
nRBC: 0 % (ref 0.0–0.2)

## 2024-04-07 LAB — CMP (CANCER CENTER ONLY)
ALT: 16 U/L (ref 0–44)
AST: 15 U/L (ref 15–41)
Albumin: 3.7 g/dL (ref 3.5–5.0)
Alkaline Phosphatase: 86 U/L (ref 38–126)
Anion gap: 5 (ref 5–15)
BUN: 19 mg/dL (ref 8–23)
CO2: 28 mmol/L (ref 22–32)
Calcium: 9.7 mg/dL (ref 8.9–10.3)
Chloride: 105 mmol/L (ref 98–111)
Creatinine: 0.92 mg/dL (ref 0.44–1.00)
GFR, Estimated: >60 mL/min (ref >60)
Glucose, Bld: 265 mg/dL — ABNORMAL HIGH (ref 70–99)
Potassium: 3.9 mmol/L (ref 3.5–5.1)
Sodium: 138 mmol/L (ref 135–145)
Total Bilirubin: 0.7 mg/dL (ref 0.0–1.2)
Total Protein: 6 g/dL — ABNORMAL LOW (ref 6.5–8.1)

## 2024-04-07 MED ORDER — DIPHENHYDRAMINE HCL 25 MG PO CAPS
25.0000 mg | ORAL_CAPSULE | Freq: Once | ORAL | Status: AC
  Administered 2024-04-07: 25 mg via ORAL
  Filled 2024-04-07: qty 1

## 2024-04-07 MED ORDER — FENTANYL 12 MCG/HR TD PT72: 1.0000 | MEDICATED_PATCH | TRANSDERMAL | 0 refills | Status: AC

## 2024-04-07 MED ORDER — CARFILZOMIB CHEMO INJECTION 60 MG
36.0000 mg/m2 | Freq: Once | INTRAVENOUS | Status: AC
  Administered 2024-04-07: 60 mg via INTRAVENOUS
  Filled 2024-04-07: qty 30

## 2024-04-07 MED ORDER — SODIUM CHLORIDE 0.9% FLUSH
10.0000 mL | Freq: Once | INTRAVENOUS | Status: AC
  Administered 2024-04-07: 10 mL

## 2024-04-07 MED ORDER — DEXAMETHASONE 4 MG PO TABS
8.0000 mg | ORAL_TABLET | Freq: Once | ORAL | Status: AC
  Administered 2024-04-07: 8 mg via ORAL
  Filled 2024-04-07: qty 2

## 2024-04-07 MED ORDER — SODIUM CHLORIDE 0.9 % IV SOLN: Freq: Once | INTRAVENOUS | Status: AC

## 2024-04-07 MED ORDER — SODIUM CHLORIDE 0.9% FLUSH
10.0000 mL | INTRAVENOUS | Status: DC | PRN
  Administered 2024-04-07: 10 mL

## 2024-04-07 MED ORDER — FAMOTIDINE IN NACL 20-0.9 MG/50ML-% IV SOLN
20.0000 mg | Freq: Once | INTRAVENOUS | Status: AC
  Administered 2024-04-07: 20 mg via INTRAVENOUS
  Filled 2024-04-07: qty 50

## 2024-04-07 MED ORDER — HEPARIN SOD (PORK) LOCK FLUSH 100 UNIT/ML IV SOLN
500.0000 [IU] | Freq: Once | INTRAVENOUS | Status: AC | PRN
  Administered 2024-04-07: 500 [IU]

## 2024-04-07 MED ORDER — ACETAMINOPHEN 325 MG PO TABS
650.0000 mg | ORAL_TABLET | Freq: Once | ORAL | Status: AC
Start: 2024-04-07 — End: 2024-04-07
  Administered 2024-04-07: 650 mg via ORAL
  Filled 2024-04-07: qty 2

## 2024-04-07 MED ORDER — PROCHLORPERAZINE MALEATE 10 MG PO TABS
10.0000 mg | ORAL_TABLET | Freq: Once | ORAL | Status: AC
  Administered 2024-04-07: 10 mg via ORAL
  Filled 2024-04-07: qty 1

## 2024-04-07 NOTE — Progress Notes (Signed)
 HEMATOLOGY/ONCOLOGY CLINIC NOTE  Date of Service: 04/07/24   Patient Care Team: Adelia Homestead, MD as PCP - General (Internal Medicine) Pietro Bridegroom, MD (Inactive) as Consulting Physician (Gastroenterology) Lillian Rein, MD as Consulting Physician (Gynecology) Dayne Even, MD as Consulting Physician (Orthopedic Surgery) Faustina Hood, MD as Consulting Physician (Pulmonary Disease) Amedeo Jupiter, MD (Ophthalmology)  CHIEF COMPLAINTS/PURPOSE OF VISIT:  Follow-up for continued evaluation and management of multiple myeloma  HISTORY OF PRESENTING ILLNESS:  Please see previous notes for details on initial presentation  Current Treatment: Carfilzomib  + Revlimid  maintenance.  INTERVAL HISTORY:   Faith Orr is a 83 y.o. female who is here for continued evaluation and management of the patient's multiple myeloma. She is here to start cycle 15 day 15 of her treatment.   Patient was last seen by me on 03/10/2024 and complained of persistent back ache, increased dull headache, nausea, and chronic balance issues.  Today, she presents with a walker. Patient reports that she recently had a upper respiratory cold infection which has resolved. She denies any concern for fever, chills, or SOB.   Patient denies any abdominal pain or leg swelling.   Patient has been tolerating revlimid  and carfilzomib  well with no toxicity issues.   She denies any recent medication changes.   Her back issus have been stable. She notes that if she is sitting for an extended period, her back pain is more bothersome, and it improves when moving around regularly. Her back pain has not been as bad as previously.  Patient continues to use 12 MCG fentanyl  patch every 72 hours as well as Oxycodone  10 MG twice daily to manage her back pain. She reports that she generally takes Oxycodone  to keep her pain from worsening.   Her blood glucose was noted to be slightly high at 265. Patient notes  that her blood sugar levels are generally otherwise stable at home.   MEDICAL HISTORY:  Past Medical History:  Diagnosis Date   Allergy    seasonal   Asthma    DEPRESSION    DIABETES MELLITUS, TYPE II    Diverticulosis    HYPERLIPIDEMIA    Macular degeneration of left eye    mild, Dr.Hecker   Obesity, unspecified    Osteoarthritis of both knees    OSTEOPENIA    Osteopenia    URINARY INCONTINENCE     SURGICAL HISTORY: Past Surgical History:  Procedure Laterality Date   CATARACT EXTRACTION Left 05/24/2018   CESAREAN SECTION  01/1973   CYSTOSCOPY/URETEROSCOPY/HOLMIUM LASER/STENT PLACEMENT Right 09/20/2019   Procedure: CYSTOSCOPY/URETEROSCOPY/HOLMIUM LASER/STENT PLACEMENT;  Surgeon: Samson Croak, MD;  Location: [facility];  Service: Urology;  Laterality: Right;   FRACTURE SURGERY     IR BONE MARROW BIOPSY & ASPIRATION  02/16/2023   IR IMAGING GUIDED PORT INSERTION  02/20/2019   left wrist surgery  2008   By Dr. Ira Mann   right ankle  1994    SOCIAL HISTORY: Social History   Socioeconomic History   Marital status: Married    Spouse name: Not on file   Number of children: 1   Years of education: Not on file   Highest education level: Not on file  Occupational History    Employer: [employer]  Tobacco Use   Smoking status: Never   Smokeless tobacco: Never   Tobacco comments:    Lives with partner Vito Grippe) and son  Vaping Use   Vaping status: Never Used  Substance and Sexual Activity   Alcohol use: No    Alcohol/week: 0.0 standard drinks of alcohol   Drug use: No   Sexual activity: Never    Partners: Female    Birth control/protection: Post-menopausal    Comment: Lives with female partner (annette hicks) and 59 yo son  Other Topics Concern   Not on file  Social History Narrative   Not on file   Social Drivers of Health   Financial Resource Strain: Low Risk  (09/09/2023)   Overall Financial Resource Strain (CARDIA)     Difficulty of Paying Living Expenses: Not hard at all  Food Insecurity: No Food Insecurity (09/09/2023)   Hunger Vital Sign    Worried About Running Out of Food in the Last Year: Never true    Ran Out of Food in the Last Year: Never true  Transportation Needs: No Transportation Needs (09/09/2023)   PRAPARE - Administrator, Civil Service (Medical): No    Lack of Transportation (Non-Medical): No  Physical Activity: Inactive (09/09/2023)   Exercise Vital Sign    Days of Exercise per Week: 0 days    Minutes of Exercise per Session: 0 min  Stress: No Stress Concern Present (09/09/2023)   [employer] of Occupational Health - Occupational Stress Questionnaire    Feeling of Stress : Not at all  Social Connections: Socially Integrated (09/09/2023)   Social Connection and Isolation Panel [NHANES]    Frequency of Communication with Friends and Family: More than three times a week    Frequency of Social Gatherings with Friends and Family: More than three times a week    Attends Religious Services: More than 4 times per year    Active Member of [employer] or Organizations: Yes    Attends Engineer, structural: More than 4 times per year    Marital Status: Married  Catering manager Violence: Not At Risk (05/08/2021)   Humiliation, Afraid, Rape, and Kick questionnaire    Fear of Current or Ex-Partner: No    Emotionally Abused: No    Physically Abused: No    Sexually Abused: No    FAMILY HISTORY: Family History  Problem Relation Age of Onset   Diabetes Father    Hyperlipidemia Father    Heart disease Father    Cancer Father    Hypertension Father    Colon cancer Paternal Grandmother 67   Osteoporosis Mother    Protein S deficiency Mother    Hyperlipidemia Mother    Multiple sclerosis Daughter    Cancer Other        bladder   Breast cancer Neg Hx     ALLERGIES:  is allergic to levofloxacin , penicillins, aleve [naproxen sodium], and sulfonamide  derivatives.  MEDICATIONS:  Current Outpatient Medications  Medication Sig Dispense Refill   acyclovir  (ZOVIRAX ) 400 MG tablet TAKE 1 TABLET(400 MG) BY MOUTH TWICE DAILY 60 tablet 5   Blood Glucose Monitoring Suppl (FREESTYLE FREEDOM LITE) W/DEVICE KIT Use to check blood sugars twice a day Dx 250.00 1 each 0   Calcium  Carbonate-Vitamin D  600-400 MG-UNIT tablet Take 1 tablet by mouth 2 (two) times daily.     dexamethasone  (DECADRON ) 4 MG tablet Take 5 tablets (20 mg total) by mouth once a week. On D22 of each cycle of treatment 20 tablet 5   ELIQUIS  2.5 MG TABS tablet TAKE 1 TABLET(2.5 MG) BY MOUTH TWICE DAILY 60 tablet 2   fentaNYL  (DURAGESIC ) 12 MCG/HR Place 1 patch onto the skin every  3 (three) days. 15 patch 0   fluticasone  (FLONASE ) 50 MCG/ACT nasal spray Place 1 spray into both nostrils daily. 48 g 3   gabapentin  (NEURONTIN ) 100 MG capsule Take 1 capsule (100 mg total) by mouth 2 (two) times daily. 60 capsule 0   glucose blood (FREESTYLE LITE) test strip CHECK BLOOD SUGAR TWICE DAILY AS DIRECTED Dx 250.00 180 each 3   Lancets (FREESTYLE) lancets Use twice daily to check sugars. 100 each 11   lenalidomide  (REVLIMID ) 5 MG capsule TAKE 1 CAPSULE BY MOUTH DAILY  FOR 21 DAYS, THEN 7 DAYS OFF 21 capsule 0   lidocaine  (LIDODERM ) 5 % Place 1 patch onto the skin daily. Remove & Discard patch within 12 hours or as directed by MD 30 patch 0   lidocaine -prilocaine  (EMLA ) cream APPLY 1 APPLICATION TO THE AFFECTED AREA AS NEEDED. USE PRIOR TO PORT ACCESS 30 g 0   meclizine  (ANTIVERT ) 25 MG tablet Take 1 tablet (25 mg total) by mouth every 8 (eight) hours as needed for dizziness. 20 tablet 0   metFORMIN  (GLUCOPHAGE -XR) 500 MG 24 hr tablet Take 1 tablet (500 mg total) by mouth 2 (two) times daily with a meal. 180 tablet 3   metroNIDAZOLE  (METROGEL ) 1 % gel Apply topically in the morning and at bedtime. 60 g 1   Multiple Vitamins-Minerals (ICAPS) CAPS Take 1 capsule by mouth daily after breakfast.      ondansetron  (ZOFRAN ) 8 MG tablet Take 1 tablet (8 mg total) by mouth 2 (two) times daily as needed (Nausea or vomiting). 30 tablet 1   Oxycodone  HCl 10 MG TABS Take 1 tablet (10 mg total) by mouth every 6 (six) hours as needed. 90 tablet 0   pantoprazole  (PROTONIX ) 20 MG tablet TAKE 1 TABLET(20 MG) BY MOUTH DAILY 30 tablet 2   polyethylene glycol (MIRALAX / GLYCOLAX) packet Take 17 g by mouth daily after breakfast.     potassium chloride  SA (KLOR-CON  M) 20 MEQ tablet TAKE 2 TABLETS(40 MEQ) BY MOUTH TWICE DAILY 360 tablet 1   prochlorperazine  (COMPAZINE ) 10 MG tablet Take 1 tablet (10 mg total) by mouth every 6 (six) hours as needed (Nausea or vomiting). 30 tablet 1   senna-docusate (SENNA S) 8.6-50 MG tablet Take 2 tablets by mouth at bedtime. 60 tablet 2   sertraline  (ZOLOFT ) 100 MG tablet Take 1 tablet (100 mg total) by mouth daily. 90 tablet 3   simvastatin  (ZOCOR ) 20 MG tablet Take 1 tablet (20 mg total) by mouth daily at 6 PM. 90 tablet 3   tizanidine  (ZANAFLEX ) 2 MG capsule Take 1 capsule (2 mg total) by mouth in the morning and at bedtime. 20 capsule 0   Vitamin D , Ergocalciferol , (DRISDOL ) 1.25 MG (50000 UNIT) CAPS capsule Take 1 capsule (50,000 Units total) by mouth every 7 (seven) days. 12 capsule 0   No current facility-administered medications for this visit.   Facility-Administered Medications Ordered in Other Visits  Medication Dose Route Frequency Provider Last Rate Last Admin   heparin  lock flush 100 unit/mL  500 Units Intracatheter Once PRN Kendry Pfarr Kishore, MD       sodium chloride  flush (NS) 0.9 % injection 10 mL  10 mL Intracatheter PRN Mckensi Redinger Kishore, MD   10 mL at 12/17/23 1336    REVIEW OF SYSTEMS:    10 Point review of Systems was done is negative except as noted above.   PHYSICAL EXAMINATION: ECOG FS:2 - Symptomatic, <50% confined to bed  .BP 124/61 (BP Location:  Left Arm, Patient Position: Sitting)   Pulse 73   Temp 97.9 F (36.6 C) (Temporal)   Resp  18   Wt 153 lb 3.2 oz (69.5 kg)   LMP 10/09/2012   SpO2 96%   BMI 30.94 kg/m   GENERAL:alert, in no acute distress and comfortable SKIN: no acute rashes, no significant lesions EYES: conjunctiva are pink and non-injected, sclera anicteric OROPHARYNX: MMM, no exudates, no oropharyngeal erythema or ulceration NECK: supple, no JVD LYMPH:  no palpable lymphadenopathy in the cervical, axillary or inguinal regions LUNGS: clear to auscultation b/l with normal respiratory effort HEART: regular rate & rhythm ABDOMEN:  normoactive bowel sounds , non tender, not distended. Extremity: no pedal edema PSYCH: alert & oriented x 3 with fluent speech NEURO: no focal motor/sensory deficits   LABS .    Latest Ref Rng & Units 04/07/2024   10:21 AM 03/10/2024   11:27 AM 02/25/2024    1:23 PM  CBC  WBC 4.0 - 10.5 K/uL 4.0  5.5  5.2   Hemoglobin 12.0 - 15.0 g/dL 46.9  62.9  52.8   Hematocrit 36.0 - 46.0 % 36.6  37.6  38.3   Platelets 150 - 400 K/uL 154  213  155    .    Latest Ref Rng & Units 04/07/2024   10:21 AM 03/10/2024   11:27 AM 02/25/2024    1:23 PM  CMP  Glucose 70 - 99 mg/dL 413  244  010   BUN 8 - 23 mg/dL 19  18  13    Creatinine 0.44 - 1.00 mg/dL 2.72  5.36  6.44   Sodium 135 - 145 mmol/L 138  137  140   Potassium 3.5 - 5.1 mmol/L 3.9  3.7  3.8   Chloride 98 - 111 mmol/L 105  105  109   CO2 22 - 32 mmol/L 28  23  24    Calcium  8.9 - 10.3 mg/dL 9.7  9.8  9.3   Total Protein 6.5 - 8.1 g/dL 6.0  6.2  5.9   Total Bilirubin 0.0 - 1.2 mg/dL 0.7  0.6  0.7   Alkaline Phos 38 - 126 U/L 86  92  107   AST 15 - 41 U/L 15  12  13    ALT 0 - 44 U/L 16  12  13       09/18/2019 BM Bx Report ([id number])   09/18/2019 FISH Panel    05/30/2019 BM Bx   01/06/2019 BM Bx:     01/06/19 Cytogenetics:      05/30/19 BM Biopsy:   09/18/2019 FISH Panel    09/18/2019 BM Surgical Pathology ([id number])     RADIOGRAPHIC STUDIES: I have personally reviewed the radiological  images as listed and agreed with the findings in the report. No results found.    ASSESSMENT & PLAN:   83 y.o. female with  1.  Nonsecretory multiple Myeloma, RISS Stage III  Labs upon initial presentation from 12/08/18, blood counts are normal including WBC at 7.1k, HGB at 13.1, and PLT at 245k. Calcium  normal at 10.3. Creatinine normal at 0.63. M spike at 0.5g. 12/13/18 Bone Scan revealed Multifocal uptake throughout the skeleton, consistent with diffuse metastatic disease. Primary tumor is not specified. 2. Uptake in the proximal right femur, consistent with lytic lesions. 3. Uptake in the ribs bilaterally as described. 4. Lesions in the proximal left humerus. 5. Diffuse uptake throughout the skull consistent with metastatic disease. 6. Right paramedian uptake at the manubrium.  12/13/18 CT  Right Femur revealed Numerous lytic lesions involving the right femur and a lytic lesion in the left inferior pubic ramus. Overall appearance is most concerning for multiple myeloma  12/27/18 Pretreatment 24hour UPEP observed an M spike at 18mg , and showed 199mg  total protein/day.  12/27/18 Pretreatment MMP revealed M Protein at 0.5g with IgG Lambda specificity. Kappa:Lambda light chain ratio at 0.13, with Lambda at 40.3. There is less abnormal protein and light chains than I would expect from 30% plasma cells, which suggests hypo-secretory or non-secretory neoplastic plasma cells. Will have an impact in assessing response. 01/05/19 PET/CT revealed Innumerable lytic lesions in the skeleton compatible with myeloma. Most of the larger lesions are hypermetabolic, for example including a left proximal humeral shaft lesion with maximum SUV of 8.1 and a 2.8 cm lesion in the left T9 vertebral body with maximum SUV 5.1. Most of the smaller lytic lesions, and some of the larger lesions, do not demonstrate accentuated metabolic activity. 2. 1.2 cm in short axis lymph node in the left parapharyngeal space is  hypermetabolic with maximum SUV 11.8. I do not see a separate mass in the head and neck to give rise to this hypermetabolic lymph node. 3. Mosaic attenuation in the lower lobes, nonspecific possibly from air trapping. 4.  Aortic Atherosclerosis 5. Heterogeneous activity in the liver, making it hard to exclude small liver lesions. Consider hepatic protocol MRI with and without contrast for definitive assessment. Nonobstructive right nephrolithiasis. Old granulomatous disease  01/06/19 Bone Marrow biopsy revealed interstitial increase in plasma cells (28% aspirate, 40% CD138 immunohistochemistry). Plasma cells negative for light chains consistent with a non or weakly secretory myeloma   01/06/19 Cytogenetics revealed 37% of cells with trisomy 11 or 11q deletion, and 40.5% of cells with 17p mutation  S/p 5 cycles of KRD treatment  05/31/19 BM Biopsy revealed mild atypical plasmacytosis at 5% with polytypic variation.   06/01/19 PET/CT revealed "Dominant lesion in the LEFT humerus is decreased significantly in metabolic activity. Additional hypermetabolic skeletal lytic lesions have decreased in metabolic activity or similar to comparison exam (01/05/2019). No evidence of disease progression. 2. Multiple additional lytic lesions do not have metabolic activity and unchanged. 3. No new skeletal lesions are identified. No soft tissue plasmacytoma identified. 4. Nodule / node in the LEFT parapharyngeal space which is intensely hypermetabolic not changed from prior. 5. New hypermetabolic LEFT lower lobe pulmonary nodule is indeterminate. Recommend close attention on follow-up 6. New obstructive hydronephrosis of the RIGHT kidney related to RIGHT UPJ stone."  09/18/2019 BM Bx Report which revealed "Slightly hypercellular bone marrow for age with trilineage hematopoiesis and 1% plasma cells."  09/14/2019 PET/CT Whole Body Scan ([id number]) which revealed "1. There widespread tiny lytic lesions compatible with  multiple myeloma. Index larger lesions are generally similar to the prior exam, with low-grade activity such as the left T9 vertebral body lesion with maximum SUV 4.5. Is mild increase in the activity associated with a mildly sclerotic left proximal humeral lesion, maximum SUV 4.8 (previously 3.5). 2. At the site of the prior left lower lobe nodule is currently more bandlike thickening, with maximum SUV only 1.9, probably benign, continued surveillance of this region suggested. 3. There several small but hypermetabolic lymph nodes. This includes a left parapharyngeal space node measuring 1.0 cm with maximum SUV 12.3 (stable); a left level IB lymph node measuring 0.5 cm with maximum SUV 4.8 (slightly larger than prior); and a left inguinal lymph node measuring 0.7 cm in short axis with maximum SUV 6.4 (previously  0.5 cm with maximum SUV 0.6). Significance of these lymph nodes uncertain, surveillance is recommended. 4. New 5 mm left lower lobe subpleural nodule on image 32/8, not appreciably hypermetabolic, surveillance suggested. 5. Focal subcutaneous stranding along the left perineum measuring about 2.6 by 1.1 cm on image 221/4, maximum SUV 12.5. This was not present previously and is most likely inflammatory, although given the notable SUV, surveillance of this region is suggested. 6. Other imaging findings of potential clinical significance: Aortic Atherosclerosis (ICD10-I70.0). Coronary atherosclerosis. Old granulomatous disease. Mild right hydronephrosis due to a 7 mm right UPJ calculus. 2 mm right kidney upper pole nonobstructive renal calculus. Prominent stool throughout the colon favors constipation."  12/19/2019 Thoracic & Lumbar Spine MRI ([id number]) ([id number]) revealed "Suspected myeloma lesions at T9 and S1. No compression deformity or epidural disease.  01/18/2020 PET/CT ([id number]) which revealed "1. Stable lytic lesions throughout the skeleton. The larger lytic lesions which had mild metabolic  activity on comparison exam now have background metabolic activity. No evidence of active myeloma. No evidence of progression multiple myeloma.  No plasmacytoma 3. Hypermetabolic nodules in the LEFT neck may be associated deep tissues of the LEFT parotid gland. 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  01/17/19 MRI Liver revealed Several appreciable liver lesions all have benign imaging characteristics. No MRI findings of metastatic involvement of the liver. 2. Scattered bony lesions corresponding to the lytic lesions seen at PET-CT, compatible with active myeloma. 3. Aortic Atherosclerosis.  Mild cardiomegaly. 4. Diffuse hepatic steatosis.   3. Left lower lobe pulmonary nodule First seen on 06/01/19 PET/CT PET/CT 08/27/2020: No hypermetabolic mediastinal or hilar nodes. No suspicious pulmonary nodules on the CT scan.  4. 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 Being managed conservatively as per patient's preference.  No symptoms from this currently.  5. Mild chronic kidney disease CMP done 04/09/2022 showed creatinine of 1.17 and GFR est of 48 consistent with mild chronic kidney disease.  PLAN:  -Discussed lab results on 04/07/24 in detail with patient. CBC normal, showed WBC of 4.0K, hemoglobin of 12.2, and platelets of 154K. -CMP is stable besides slightly high blood glucose -discussed that if she is only needing to use oxycodone  1-2x a day, we could try holding fentanyl  patch and seeing if her pain is worse.  -will refill fentanyl  patches- there may be a role to wean off her last dose of fentanyl  patch -discussed option to delay use of oxycodone  and see if her pain  is significantly worsened -continue oxycodone  as needed  -discussed that reducing chronic narcotics if they're not needed could also reduce fatigue -answered all of patient's questions in detail -patient shall return to clinic in a couple months with labs  FOLLOW UP: Per integrated scheduling  The total time spent in the appointment was 25 minutes* .  All of the patient's questions were answered with apparent satisfaction. The patient knows to call the clinic with any problems, questions or concerns.   Jacquelyn Matt MD MS AAHIVMS [facility] Hematology/Oncology Physician [facility]  .*Total Encounter Time as defined by the Centers for Medicare and Medicaid Services includes, in addition to the face-to-face time of a patient visit (documented in the note above) non-face-to-face time:  obtaining and reviewing outside history, ordering and reviewing medications, tests or procedures, care coordination (communications with other health care professionals or caregivers) and documentation in the medical record.    I,Mitra Faeizi,acting as a Neurosurgeon for Jacquelyn Matt, MD.,have documented all relevant documentation on the behalf of Jacquelyn Matt, MD,as directed by  Jacquelyn Matt, MD while in the presence of Jacquelyn Matt, MD.  .I have reviewed the above documentation for accuracy and completeness, and I agree with the above. .Anis Cinelli Kishore Bera Pinela MD

## 2024-04-07 NOTE — Patient Instructions (Signed)

## 2024-04-07 NOTE — Progress Notes (Signed)
 Patient seen by Dr. Addison Naegeli are within treatment parameters.  Labs reviewed: and are within treatment parameters.  Per physician team, patient is ready for treatment and there are NO modifications to the treatment plan.

## 2024-04-09 NOTE — Progress Notes (Incomplete)
 HEMATOLOGY/ONCOLOGY CLINIC NOTE  Date of Service: 04/07/24   Patient Care Team: Adelia Homestead, MD as PCP - General (Internal Medicine) Pietro Bridegroom, MD (Inactive) as Consulting Physician (Gastroenterology) Lillian Rein, MD as Consulting Physician (Gynecology) Dayne Even, MD as Consulting Physician (Orthopedic Surgery) Faustina Hood, MD as Consulting Physician (Pulmonary Disease) Amedeo Jupiter, MD (Ophthalmology)  CHIEF COMPLAINTS/PURPOSE OF VISIT:  Follow-up for continued evaluation and management of multiple myeloma  HISTORY OF PRESENTING ILLNESS:  Please see previous notes for details on initial presentation  Current Treatment: Carfilzomib  + Revlimid  maintenance.  INTERVAL HISTORY:   Faith Orr is a 83 y.o. female who is here for continued evaluation and management of the patient's multiple myeloma. She is here to start cycle 15 day 15 of her treatment.   Patient was last seen by me on 03/10/2024 and complained of persistent back ache, increased dull headache, nausea, and chronic balance issues.  Today, she presents with a walker. Patient reports that she recently had a upper respiratory cold infection which has resolved. She denies any concern for fever, chills, or SOB.   Patient denies any abdominal pain, leg swelling,   -patient shall return to clinic in a couple months with labs  She denies any recent medication changes.     Her back issus have been stable. She notes that if she is sitting for an extended period, her back pain is more bothersome, and it improves when moving around regularly. Her back pain has not been as bad as previously.  Patient continues to take 12 MCG fentanyl  patch every 72 hours as well as Oxycodone  10 MG twice daily to manage her back pain. She reports that she generally takes Oxycodone  to keep her pain from worsening.   Her blood glucose was noted to be slightly high at 265. Patient notes that her blood sugar  levels are generally otherwise stable at home.        -discussed that if she is only needing to use oxycodone  1-2x a day, we could try holding fentanyl  patch and seeing if her pain is worse.     -cut down fentanyl  patch?    Which could reduce fatigue from reducing chronic narcotoics if not needed.   -DELAY oxy and see if pain is significantly worsened  -could try off off fent and oxy as needed        -Discussed lab results on 04/07/24 in detail with patient. CBC normal, showed WBC of 4.0K, hemoglobin of 12.2, and platelets of 154K. -CMP is stable besides slightly high blood glucose   -will refill fentanyl  patches- there may be a role to wean off her last dose of fentanyl  patch  -answered all of patient's questions in detail    Patinet has been tolerating revlimid  and carfilzomib  well with no toxicity issiues  MEDICAL HISTORY:  Past Medical History:  Diagnosis Date  . Allergy    seasonal  . Asthma   . DEPRESSION   . DIABETES MELLITUS, TYPE II   . Diverticulosis   . HYPERLIPIDEMIA   . Macular degeneration of left eye    mild, Dr.Hecker  . Obesity, unspecified   . Osteoarthritis of both knees   . OSTEOPENIA   . Osteopenia   . URINARY INCONTINENCE     SURGICAL HISTORY: Past Surgical History:  Procedure Laterality Date  . CATARACT EXTRACTION Left 05/24/2018  . CESAREAN SECTION  01/1973  . CYSTOSCOPY/URETEROSCOPY/HOLMIUM LASER/STENT PLACEMENT Right 09/20/2019   Procedure: CYSTOSCOPY/URETEROSCOPY/HOLMIUM LASER/STENT PLACEMENT;  Surgeon: Samson Croak, MD;  Location: [facility];  Service: Urology;  Laterality: Right;  . FRACTURE SURGERY    . IR BONE MARROW BIOPSY & ASPIRATION  02/16/2023  . IR IMAGING GUIDED PORT INSERTION  02/20/2019  . left wrist surgery  2008   By Dr. Daldorf  . right ankle  1994    SOCIAL HISTORY: Social History   Socioeconomic History  . Marital status: Married    Spouse name: Not on file  . Number of  children: 1  . Years of education: Not on file  . Highest education level: Not on file  Occupational History    Employer: [employer]  Tobacco Use  . Smoking status: Never  . Smokeless tobacco: Never  . Tobacco comments:    Lives with partner Vito Grippe) and son  Vaping Use  . Vaping status: Never Used  Substance and Sexual Activity  . Alcohol use: No    Alcohol/week: 0.0 standard drinks of alcohol  . Drug use: No  . Sexual activity: Never    Partners: Female    Birth control/protection: Post-menopausal    Comment: Lives with female partner (annette hicks) and 53 yo son  Other Topics Concern  . Not on file  Social History Narrative  . Not on file   Social Drivers of Health   Financial Resource Strain: Low Risk  (09/09/2023)   Overall Financial Resource Strain (CARDIA)   . Difficulty of Paying Living Expenses: Not hard at all  Food Insecurity: No Food Insecurity (09/09/2023)   Hunger Vital Sign   . Worried About Programme researcher, broadcasting/film/video in the Last Year: Never true   . Ran Out of Food in the Last Year: Never true  Transportation Needs: No Transportation Needs (09/09/2023)   PRAPARE - Transportation   . Lack of Transportation (Medical): No   . Lack of Transportation (Non-Medical): No  Physical Activity: Inactive (09/09/2023)   Exercise Vital Sign   . Days of Exercise per Week: 0 days   . Minutes of Exercise per Session: 0 min  Stress: No Stress Concern Present (09/09/2023)   [employer] of Occupational Health - Occupational Stress Questionnaire   . Feeling of Stress : Not at all  Social Connections: Socially Integrated (09/09/2023)   Social Connection and Isolation Panel [NHANES]   . Frequency of Communication with Friends and Family: More than three times a week   . Frequency of Social Gatherings with Friends and Family: More than three times a week   . Attends Religious Services: More than 4 times per year   . Active Member of Clubs or Organizations: Yes    . Attends Banker Meetings: More than 4 times per year   . Marital Status: Married  Catering manager Violence: Not At Risk (05/08/2021)   Humiliation, Afraid, Rape, and Kick questionnaire   . Fear of Current or Ex-Partner: No   . Emotionally Abused: No   . Physically Abused: No   . Sexually Abused: No    FAMILY HISTORY: Family History  Problem Relation Age of Onset  . Diabetes Father   . Hyperlipidemia Father   . Heart disease Father   . Cancer Father   . Hypertension Father   . Colon cancer Paternal Grandmother 52  . Osteoporosis Mother   . Protein S deficiency Mother   . Hyperlipidemia Mother   . Multiple sclerosis Daughter   . Cancer Other        bladder  .  Breast cancer Neg Hx     ALLERGIES:  is allergic to levofloxacin , penicillins, aleve [naproxen sodium], and sulfonamide derivatives.  MEDICATIONS:  Current Outpatient Medications  Medication Sig Dispense Refill  . acyclovir  (ZOVIRAX ) 400 MG tablet TAKE 1 TABLET(400 MG) BY MOUTH TWICE DAILY 60 tablet 5  . Blood Glucose Monitoring Suppl (FREESTYLE FREEDOM LITE) W/DEVICE KIT Use to check blood sugars twice a day Dx 250.00 1 each 0  . Calcium  Carbonate-Vitamin D  600-400 MG-UNIT tablet Take 1 tablet by mouth 2 (two) times daily.    . dexamethasone  (DECADRON ) 4 MG tablet Take 5 tablets (20 mg total) by mouth once a week. On D22 of each cycle of treatment 20 tablet 5  . ELIQUIS  2.5 MG TABS tablet TAKE 1 TABLET(2.5 MG) BY MOUTH TWICE DAILY 60 tablet 2  . fentaNYL  (DURAGESIC ) 12 MCG/HR Place 1 patch onto the skin every 3 (three) days. 15 patch 0  . fluticasone  (FLONASE ) 50 MCG/ACT nasal spray Place 1 spray into both nostrils daily. 48 g 3  . gabapentin  (NEURONTIN ) 100 MG capsule Take 1 capsule (100 mg total) by mouth 2 (two) times daily. 60 capsule 0  . glucose blood (FREESTYLE LITE) test strip CHECK BLOOD SUGAR TWICE DAILY AS DIRECTED Dx 250.00 180 each 3  . Lancets (FREESTYLE) lancets Use twice daily to check  sugars. 100 each 11  . lenalidomide  (REVLIMID ) 5 MG capsule TAKE 1 CAPSULE BY MOUTH DAILY  FOR 21 DAYS, THEN 7 DAYS OFF 21 capsule 0  . lidocaine  (LIDODERM ) 5 % Place 1 patch onto the skin daily. Remove & Discard patch within 12 hours or as directed by MD 30 patch 0  . lidocaine -prilocaine  (EMLA ) cream APPLY 1 APPLICATION TO THE AFFECTED AREA AS NEEDED. USE PRIOR TO PORT ACCESS 30 g 0  . meclizine  (ANTIVERT ) 25 MG tablet Take 1 tablet (25 mg total) by mouth every 8 (eight) hours as needed for dizziness. 20 tablet 0  . metFORMIN  (GLUCOPHAGE -XR) 500 MG 24 hr tablet Take 1 tablet (500 mg total) by mouth 2 (two) times daily with a meal. 180 tablet 3  . metroNIDAZOLE  (METROGEL ) 1 % gel Apply topically in the morning and at bedtime. 60 g 1  . Multiple Vitamins-Minerals (ICAPS) CAPS Take 1 capsule by mouth daily after breakfast.    . ondansetron  (ZOFRAN ) 8 MG tablet Take 1 tablet (8 mg total) by mouth 2 (two) times daily as needed (Nausea or vomiting). 30 tablet 1  . Oxycodone  HCl 10 MG TABS Take 1 tablet (10 mg total) by mouth every 6 (six) hours as needed. 90 tablet 0  . pantoprazole  (PROTONIX ) 20 MG tablet TAKE 1 TABLET(20 MG) BY MOUTH DAILY 30 tablet 2  . polyethylene glycol (MIRALAX / GLYCOLAX) packet Take 17 g by mouth daily after breakfast.    . potassium chloride  SA (KLOR-CON  M) 20 MEQ tablet TAKE 2 TABLETS(40 MEQ) BY MOUTH TWICE DAILY 360 tablet 1  . prochlorperazine  (COMPAZINE ) 10 MG tablet Take 1 tablet (10 mg total) by mouth every 6 (six) hours as needed (Nausea or vomiting). 30 tablet 1  . senna-docusate (SENNA S) 8.6-50 MG tablet Take 2 tablets by mouth at bedtime. 60 tablet 2  . sertraline  (ZOLOFT ) 100 MG tablet Take 1 tablet (100 mg total) by mouth daily. 90 tablet 3  . simvastatin  (ZOCOR ) 20 MG tablet Take 1 tablet (20 mg total) by mouth daily at 6 PM. 90 tablet 3  . tizanidine  (ZANAFLEX ) 2 MG capsule Take 1 capsule (2 mg total)  by mouth in the morning and at bedtime. 20 capsule 0  .  Vitamin D , Ergocalciferol , (DRISDOL ) 1.25 MG (50000 UNIT) CAPS capsule Take 1 capsule (50,000 Units total) by mouth every 7 (seven) days. 12 capsule 0   No current facility-administered medications for this visit.   Facility-Administered Medications Ordered in Other Visits  Medication Dose Route Frequency Provider Last Rate Last Admin  . heparin  lock flush 100 unit/mL  500 Units Intracatheter Once PRN Kale, Gautam Kishore, MD      . sodium chloride  flush (NS) 0.9 % injection 10 mL  10 mL Intracatheter PRN Kale, Gautam Kishore, MD   10 mL at 12/17/23 1336    REVIEW OF SYSTEMS:    10 Point review of Systems was done is negative except as noted above.   PHYSICAL EXAMINATION: ECOG FS:2 - Symptomatic, <50% confined to bed  .LMP 10/09/2012    GENERAL:alert, in no acute distress and comfortable SKIN: no acute rashes, no significant lesions EYES: conjunctiva are pink and non-injected, sclera anicteric OROPHARYNX: MMM, no exudates, no oropharyngeal erythema or ulceration NECK: supple, no JVD LYMPH:  no palpable lymphadenopathy in the cervical, axillary or inguinal regions LUNGS: clear to auscultation b/l with normal respiratory effort HEART: regular rate & rhythm ABDOMEN:  normoactive bowel sounds , non tender, not distended. Extremity: no pedal edema PSYCH: alert & oriented x 3 with fluent speech NEURO: no focal motor/sensory deficits   LABS .    Latest Ref Rng & Units 03/10/2024   11:27 AM 02/25/2024    1:23 PM 02/11/2024    8:46 AM  CBC  WBC 4.0 - 10.5 K/uL 5.5  5.2  4.6   Hemoglobin 12.0 - 15.0 g/dL 16.1  09.6  04.5   Hematocrit 36.0 - 46.0 % 37.6  38.3  38.5   Platelets 150 - 400 K/uL 213  155  182    .    Latest Ref Rng & Units 03/10/2024   11:27 AM 02/25/2024    1:23 PM 02/11/2024    8:46 AM  CMP  Glucose 70 - 99 mg/dL 409  811  914   BUN 8 - 23 mg/dL 18  13  22    Creatinine 0.44 - 1.00 mg/dL 7.82  9.56  2.13   Sodium 135 - 145 mmol/L 137  140  140   Potassium 3.5 -  5.1 mmol/L 3.7  3.8  3.8   Chloride 98 - 111 mmol/L 105  109  106   CO2 22 - 32 mmol/L 23  24  25    Calcium  8.9 - 10.3 mg/dL 9.8  9.3  9.8   Total Protein 6.5 - 8.1 g/dL 6.2  5.9  6.1   Total Bilirubin 0.0 - 1.2 mg/dL 0.6  0.7  0.6   Alkaline Phos 38 - 126 U/L 92  107  102   AST 15 - 41 U/L 12  13  12    ALT 0 - 44 U/L 12  13  14       09/18/2019 BM Bx Report ([id number])   09/18/2019 FISH Panel    05/30/2019 BM Bx   01/06/2019 BM Bx:     01/06/19 Cytogenetics:      05/30/19 BM Biopsy:   09/18/2019 FISH Panel    09/18/2019 BM Surgical Pathology ([id number])     RADIOGRAPHIC STUDIES: I have personally reviewed the radiological images as listed and agreed with the findings in the report. No results found.    ASSESSMENT & PLAN:   83 y.o.  female with  1.  Nonsecretory multiple Myeloma, RISS Stage III  Labs upon initial presentation from 12/08/18, blood counts are normal including WBC at 7.1k, HGB at 13.1, and PLT at 245k. Calcium  normal at 10.3. Creatinine normal at 0.63. M spike at 0.5g. 12/13/18 Bone Scan revealed Multifocal uptake throughout the skeleton, consistent with diffuse metastatic disease. Primary tumor is not specified. 2. Uptake in the proximal right femur, consistent with lytic lesions. 3. Uptake in the ribs bilaterally as described. 4. Lesions in the proximal left humerus. 5. Diffuse uptake throughout the skull consistent with metastatic disease. 6. Right paramedian uptake at the manubrium.  12/13/18 CT Right Femur revealed Numerous lytic lesions involving the right femur and a lytic lesion in the left inferior pubic ramus. Overall appearance is most concerning for multiple myeloma  12/27/18 Pretreatment 24hour UPEP observed an M spike at 18mg , and showed 199mg  total protein/day.  12/27/18 Pretreatment MMP revealed M Protein at 0.5g with IgG Lambda specificity. Kappa:Lambda light chain ratio at 0.13, with Lambda at 40.3. There is less abnormal  protein and light chains than I would expect from 30% plasma cells, which suggests hypo-secretory or non-secretory neoplastic plasma cells. Will have an impact in assessing response. 01/05/19 PET/CT revealed Innumerable lytic lesions in the skeleton compatible with myeloma. Most of the larger lesions are hypermetabolic, for example including a left proximal humeral shaft lesion with maximum SUV of 8.1 and a 2.8 cm lesion in the left T9 vertebral body with maximum SUV 5.1. Most of the smaller lytic lesions, and some of the larger lesions, do not demonstrate accentuated metabolic activity. 2. 1.2 cm in short axis lymph node in the left parapharyngeal space is hypermetabolic with maximum SUV 11.8. I do not see a separate mass in the head and neck to give rise to this hypermetabolic lymph node. 3. Mosaic attenuation in the lower lobes, nonspecific possibly from air trapping. 4.  Aortic Atherosclerosis 5. Heterogeneous activity in the liver, making it hard to exclude small liver lesions. Consider hepatic protocol MRI with and without contrast for definitive assessment. Nonobstructive right nephrolithiasis. Old granulomatous disease  01/06/19 Bone Marrow biopsy revealed interstitial increase in plasma cells (28% aspirate, 40% CD138 immunohistochemistry). Plasma cells negative for light chains consistent with a non or weakly secretory myeloma   01/06/19 Cytogenetics revealed 37% of cells with trisomy 11 or 11q deletion, and 40.5% of cells with 17p mutation  S/p 5 cycles of KRD treatment  05/31/19 BM Biopsy revealed mild atypical plasmacytosis at 5% with polytypic variation.   06/01/19 PET/CT revealed "Dominant lesion in the LEFT humerus is decreased significantly in metabolic activity. Additional hypermetabolic skeletal lytic lesions have decreased in metabolic activity or similar to comparison exam (01/05/2019). No evidence of disease progression. 2. Multiple additional lytic lesions do not have metabolic activity  and unchanged. 3. No new skeletal lesions are identified. No soft tissue plasmacytoma identified. 4. Nodule / node in the LEFT parapharyngeal space which is intensely hypermetabolic not changed from prior. 5. New hypermetabolic LEFT lower lobe pulmonary nodule is indeterminate. Recommend close attention on follow-up 6. New obstructive hydronephrosis of the RIGHT kidney related to RIGHT UPJ stone."  09/18/2019 BM Bx Report which revealed "Slightly hypercellular bone marrow for age with trilineage hematopoiesis and 1% plasma cells."  09/14/2019 PET/CT Whole Body Scan ([id number]) which revealed "1. There widespread tiny lytic lesions compatible with multiple myeloma. Index larger lesions are generally similar to the prior exam, with low-grade activity such as the left T9 vertebral body lesion  with maximum SUV 4.5. Is mild increase in the activity associated with a mildly sclerotic left proximal humeral lesion, maximum SUV 4.8 (previously 3.5). 2. At the site of the prior left lower lobe nodule is currently more bandlike thickening, with maximum SUV only 1.9, probably benign, continued surveillance of this region suggested. 3. There several small but hypermetabolic lymph nodes. This includes a left parapharyngeal space node measuring 1.0 cm with maximum SUV 12.3 (stable); a left level IB lymph node measuring 0.5 cm with maximum SUV 4.8 (slightly larger than prior); and a left inguinal lymph node measuring 0.7 cm in short axis with maximum SUV 6.4 (previously 0.5 cm with maximum SUV 0.6). Significance of these lymph nodes uncertain, surveillance is recommended. 4. New 5 mm left lower lobe subpleural nodule on image 32/8, not appreciably hypermetabolic, surveillance suggested. 5. Focal subcutaneous stranding along the left perineum measuring about 2.6 by 1.1 cm on image 221/4, maximum SUV 12.5. This was not present previously and is most likely inflammatory, although given the notable SUV, surveillance of this region  is suggested. 6. Other imaging findings of potential clinical significance: Aortic Atherosclerosis (ICD10-I70.0). Coronary atherosclerosis. Old granulomatous disease. Mild right hydronephrosis due to a 7 mm right UPJ calculus. 2 mm right kidney upper pole nonobstructive renal calculus. Prominent stool throughout the colon favors constipation."  12/19/2019 Thoracic & Lumbar Spine MRI ([id number]) ([id number]) revealed "Suspected myeloma lesions at T9 and S1. No compression deformity or epidural disease.  01/18/2020 PET/CT ([id number]) which revealed "1. Stable lytic lesions throughout the skeleton. The larger lytic lesions which had mild metabolic activity on comparison exam now have background metabolic activity. No evidence of active myeloma. No evidence of progression multiple myeloma.  No plasmacytoma 3. Hypermetabolic nodules in the LEFT neck may be associated deep tissues of the LEFT parotid gland. 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  01/17/19 MRI Liver revealed Several appreciable liver lesions all have benign imaging characteristics. No MRI findings of metastatic involvement of the liver. 2. Scattered bony lesions corresponding to the lytic lesions seen at PET-CT, compatible with active myeloma. 3. Aortic Atherosclerosis.  Mild cardiomegaly. 4. Diffuse hepatic steatosis.   3. Left lower lobe pulmonary nodule First seen on 06/01/19 PET/CT PET/CT 08/27/2020: No hypermetabolic mediastinal or hilar nodes. No suspicious pulmonary nodules on the CT scan.  4. 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 Being managed conservatively as per patient's preference.  No symptoms from  this currently.  5. Mild chronic kidney disease CMP done 04/09/2022 showed creatinine of 1.17 and GFR est of 48 consistent with mild chronic kidney disease.  PLAN:  -Discussed lab results on 03/10/2024 in detail with patient. CBC stable, showed WBC of 5.5K, hemoglobin of 12.5, and platelets of 213K. -blood sugars are mildly high at 280 mg/dL; CMP otherwise stable -discussed that her recent PET scan showed no active disease in the bone -Discussed that her recent scan showed  Inflammation in the sacral iliac joint as well as other degenerative changes -discussed option to connect with orthopedics to consider steroid injections into an area of inflammation such as the sacral iliac joint -patient is not inclined to connect with orthopedics for consideration of steroid shot injection at this time -discussed that  a fentanyl  patch is continuous for long-duration control while Oxycodone  tablet is for any breakthrough pain in addition -continue lower dose fentanyl  patch. Discussed that if her pain settles, then there may be a role for only taking oxycodone  as needed -continue Oxycodone  -continue acyclovir  for shingles prevention -would recommend patient to receive the shingles vaccine -discussed that it would still be recommended to continue acyclovir  even after receiving shingles vaccine -okay to take Pantoprazole  only as needed if her reflux is generally controlled with dietary changes -discussed that back pain can cause changes in walking patterns -educated patient that blood sugar fluctuation can casue headache and nausea -advised patient to regularly check her blood sugars at home -answered all of patient's questions in detail  FOLLOW UP: ***  The total time spent in the appointment was *** minutes* .  All of the patient's questions were answered with apparent satisfaction. The patient knows to call the clinic with any problems, questions or concerns.   Jacquelyn Matt MD MS AAHIVMS [facility]  [facility] Hematology/Oncology Physician [facility]  .*Total Encounter Time as defined by the Centers for Medicare and Medicaid Services includes, in addition to the face-to-face time of a patient visit (documented in the note above) non-face-to-face time: obtaining and reviewing outside history, ordering and reviewing medications, tests or procedures, care coordination (communications with other health care professionals or caregivers) and documentation in the medical record.    I,Mitra Faeizi,acting as a Neurosurgeon for Jacquelyn Matt, MD.,have documented all relevant documentation on the behalf of Jacquelyn Matt, MD,as directed by  Jacquelyn Matt, MD while in the presence of Jacquelyn Matt, MD.  ***

## 2024-04-14 ENCOUNTER — Encounter: Payer: Self-pay | Admitting: Hematology

## 2024-04-19 ENCOUNTER — Ambulatory Visit: Admitting: Internal Medicine

## 2024-04-19 ENCOUNTER — Encounter: Payer: Self-pay | Admitting: Internal Medicine

## 2024-04-19 VITALS — BP 122/80 | HR 69 | Temp 97.9°F | Ht 59.0 in | Wt 152.0 lb

## 2024-04-19 DIAGNOSIS — E785 Hyperlipidemia, unspecified: Secondary | ICD-10-CM

## 2024-04-19 DIAGNOSIS — F320 Major depressive disorder, single episode, mild: Secondary | ICD-10-CM

## 2024-04-19 DIAGNOSIS — R300 Dysuria: Secondary | ICD-10-CM

## 2024-04-19 DIAGNOSIS — Z7984 Long term (current) use of oral hypoglycemic drugs: Secondary | ICD-10-CM

## 2024-04-19 DIAGNOSIS — E118 Type 2 diabetes mellitus with unspecified complications: Secondary | ICD-10-CM

## 2024-04-19 DIAGNOSIS — E1169 Type 2 diabetes mellitus with other specified complication: Secondary | ICD-10-CM

## 2024-04-19 DIAGNOSIS — Z0000 Encounter for general adult medical examination without abnormal findings: Secondary | ICD-10-CM | POA: Diagnosis not present

## 2024-04-19 LAB — POCT URINALYSIS DIPSTICK
Bilirubin, UA: NEGATIVE
Blood, UA: POSITIVE
Glucose, UA: NEGATIVE
Ketones, UA: NEGATIVE
Leukocytes, UA: NEGATIVE
Nitrite, UA: NEGATIVE
Protein, UA: NEGATIVE
Spec Grav, UA: 1.025 (ref 1.010–1.025)
Urobilinogen, UA: NEGATIVE U/dL — AB
pH, UA: 5.5 (ref 5.0–8.0)

## 2024-04-19 LAB — LIPID PANEL
Cholesterol: 144 mg/dL (ref 0–200)
HDL: 51.9 mg/dL (ref >39.00)
LDL Cholesterol: 32 mg/dL (ref 0–99)
NonHDL: 91.84
Total CHOL/HDL Ratio: 3
Triglycerides: 297 mg/dL — ABNORMAL HIGH (ref 0.0–149.0)
VLDL: 59.4 mg/dL — ABNORMAL HIGH (ref 0.0–40.0)

## 2024-04-19 LAB — MICROALBUMIN / CREATININE URINE RATIO
Creatinine,U: 69.8 mg/dL
Microalb Creat Ratio: 52.7 mg/g — ABNORMAL HIGH (ref 0.0–30.0)
Microalb, Ur: 3.7 mg/dL — ABNORMAL HIGH (ref 0.0–1.9)

## 2024-04-19 LAB — HEMOGLOBIN A1C: Hgb A1c MFr Bld: 6.1 % (ref 4.6–6.5)

## 2024-04-19 MED ORDER — NITROFURANTOIN MONOHYD MACRO 100 MG PO CAPS: 100.0000 mg | ORAL_CAPSULE | Freq: Two times a day (BID) | ORAL | 0 refills | Status: AC

## 2024-04-19 NOTE — Progress Notes (Signed)
   Subjective:   Patient ID: Faith Orr, female    DOB: Jul 03, 1941, 83 y.o.   MRN: [id number]  HPI The patient is an 83 YO female coming in for physical and also for likely UTI with burning and lower abdominal pressure and urgency.   PMH, [facility], social history reviewed and updated  Review of Systems  Constitutional:  Positive for activity change and fatigue.  HENT: Negative.    Eyes: Negative.   Respiratory:  Negative for cough, chest tightness and shortness of breath.   Cardiovascular:  Negative for chest pain, palpitations and leg swelling.  Gastrointestinal:  Negative for abdominal distention, abdominal pain, constipation, diarrhea, nausea and vomiting.  Genitourinary:  Positive for dysuria, frequency and urgency.  Musculoskeletal:  Positive for arthralgias, back pain and gait problem.  Skin: Negative.   Psychiatric/Behavioral: Negative.      Objective:  Physical Exam Constitutional:      Appearance: She is well-developed.  HENT:     Head: Normocephalic and atraumatic.  Cardiovascular:     Rate and Rhythm: Normal rate and regular rhythm.  Pulmonary:     Effort: Pulmonary effort is normal. No respiratory distress.     Breath sounds: Normal breath sounds. No wheezing or rales.  Abdominal:     General: Bowel sounds are normal. There is no distension.     Palpations: Abdomen is soft.     Tenderness: There is no abdominal tenderness. There is no rebound.  Musculoskeletal:        General: Tenderness present.     Cervical back: Normal range of motion.  Skin:    General: Skin is warm and dry.  Neurological:     Mental Status: She is alert and oriented to person, place, and time.     Coordination: Coordination normal.     Vitals:   04/19/24 0846  BP: 122/80  Pulse: 69  Temp: 97.9 F (36.6 C)  TempSrc: Oral  SpO2: 99%  Weight: 152 lb (68.9 kg)  Height: 4\' 11"  (1.499 m)    Assessment & Plan:

## 2024-04-19 NOTE — Patient Instructions (Addendum)
 We have sent in macrobid to take 1 pill twice a day for 5 days to help in case of infection.

## 2024-04-20 ENCOUNTER — Other Ambulatory Visit: Payer: Self-pay | Admitting: Hematology

## 2024-04-20 DIAGNOSIS — C7951 Secondary malignant neoplasm of bone: Secondary | ICD-10-CM

## 2024-04-20 DIAGNOSIS — C9000 Multiple myeloma not having achieved remission: Secondary | ICD-10-CM

## 2024-04-21 ENCOUNTER — Inpatient Hospital Stay

## 2024-04-21 ENCOUNTER — Encounter: Payer: Self-pay | Admitting: Hematology

## 2024-04-21 ENCOUNTER — Encounter: Payer: Self-pay | Admitting: Internal Medicine

## 2024-04-21 ENCOUNTER — Inpatient Hospital Stay: Attending: Hematology

## 2024-04-21 ENCOUNTER — Other Ambulatory Visit: Payer: Self-pay | Admitting: Hematology

## 2024-04-21 VITALS — BP 125/64 | HR 78 | Temp 98.0°F | Resp 17 | Wt 152.0 lb

## 2024-04-21 DIAGNOSIS — C9000 Multiple myeloma not having achieved remission: Secondary | ICD-10-CM | POA: Insufficient documentation

## 2024-04-21 DIAGNOSIS — Z5112 Encounter for antineoplastic immunotherapy: Secondary | ICD-10-CM | POA: Diagnosis not present

## 2024-04-21 DIAGNOSIS — Z79899 Other long term (current) drug therapy: Secondary | ICD-10-CM | POA: Diagnosis not present

## 2024-04-21 DIAGNOSIS — Z7189 Other specified counseling: Secondary | ICD-10-CM

## 2024-04-21 DIAGNOSIS — R300 Dysuria: Secondary | ICD-10-CM | POA: Insufficient documentation

## 2024-04-21 DIAGNOSIS — Z95828 Presence of other vascular implants and grafts: Secondary | ICD-10-CM

## 2024-04-21 LAB — CMP (CANCER CENTER ONLY)
ALT: 14 U/L (ref 0–44)
AST: 12 U/L — ABNORMAL LOW (ref 15–41)
Albumin: 3.8 g/dL (ref 3.5–5.0)
Alkaline Phosphatase: 83 U/L (ref 38–126)
Anion gap: 9 (ref 5–15)
BUN: 15 mg/dL (ref 8–23)
CO2: 24 mmol/L (ref 22–32)
Calcium: 9.4 mg/dL (ref 8.9–10.3)
Chloride: 107 mmol/L (ref 98–111)
Creatinine: 0.94 mg/dL (ref 0.44–1.00)
GFR, Estimated: >60 mL/min (ref >60)
Glucose, Bld: 282 mg/dL — ABNORMAL HIGH (ref 70–99)
Potassium: 3.6 mmol/L (ref 3.5–5.1)
Sodium: 140 mmol/L (ref 135–145)
Total Bilirubin: 0.7 mg/dL (ref 0.0–1.2)
Total Protein: 6 g/dL — ABNORMAL LOW (ref 6.5–8.1)

## 2024-04-21 LAB — CBC WITH DIFFERENTIAL (CANCER CENTER ONLY)
Abs Immature Granulocytes: 0.02 10*3/uL (ref 0.00–0.07)
Basophils Absolute: 0 10*3/uL (ref 0.0–0.1)
Basophils Relative: 1 %
Eosinophils Absolute: 0.1 10*3/uL (ref 0.0–0.5)
Eosinophils Relative: 3 %
HCT: 37.7 % (ref 36.0–46.0)
Hemoglobin: 12.5 g/dL (ref 12.0–15.0)
Immature Granulocytes: 0 %
Lymphocytes Relative: 30 %
Lymphs Abs: 1.4 10*3/uL (ref 0.7–4.0)
MCH: 32.4 pg (ref 26.0–34.0)
MCHC: 33.2 g/dL (ref 30.0–36.0)
MCV: 97.7 fL (ref 80.0–100.0)
Monocytes Absolute: 0.9 10*3/uL (ref 0.1–1.0)
Monocytes Relative: 19 %
Neutro Abs: 2.3 10*3/uL (ref 1.7–7.7)
Neutrophils Relative %: 47 %
Platelet Count: 155 10*3/uL (ref 150–400)
RBC: 3.86 MIL/uL — ABNORMAL LOW (ref 3.87–5.11)
RDW: 14 % (ref 11.5–15.5)
WBC Count: 4.8 10*3/uL (ref 4.0–10.5)
nRBC: 0 % (ref 0.0–0.2)

## 2024-04-21 MED ORDER — SODIUM CHLORIDE 0.9 % IV SOLN: Freq: Once | INTRAVENOUS | Status: AC

## 2024-04-21 MED ORDER — SODIUM CHLORIDE 0.9% FLUSH
10.0000 mL | Freq: Once | INTRAVENOUS | Status: AC
  Administered 2024-04-21: 10 mL

## 2024-04-21 MED ORDER — SODIUM CHLORIDE 0.9% FLUSH
10.0000 mL | INTRAVENOUS | Status: DC | PRN
  Administered 2024-04-21: 10 mL

## 2024-04-21 MED ORDER — SODIUM CHLORIDE 0.9 % IV SOLN: Freq: Once | INTRAVENOUS | Status: DC

## 2024-04-21 MED ORDER — ACETAMINOPHEN 325 MG PO TABS
650.0000 mg | ORAL_TABLET | Freq: Once | ORAL | Status: AC
Start: 2024-04-21 — End: 2024-04-21
  Administered 2024-04-21: 650 mg via ORAL
  Filled 2024-04-21: qty 2

## 2024-04-21 MED ORDER — PROCHLORPERAZINE MALEATE 10 MG PO TABS
10.0000 mg | ORAL_TABLET | Freq: Once | ORAL | Status: AC
  Administered 2024-04-21: 10 mg via ORAL
  Filled 2024-04-21: qty 1

## 2024-04-21 MED ORDER — HEPARIN SOD (PORK) LOCK FLUSH 100 UNIT/ML IV SOLN
500.0000 [IU] | Freq: Once | INTRAVENOUS | Status: AC | PRN
  Administered 2024-04-21: 500 [IU]

## 2024-04-21 MED ORDER — DIPHENHYDRAMINE HCL 25 MG PO CAPS
25.0000 mg | ORAL_CAPSULE | Freq: Once | ORAL | Status: AC
Start: 2024-04-21 — End: 2024-04-21
  Administered 2024-04-21: 25 mg via ORAL
  Filled 2024-04-21: qty 1

## 2024-04-21 MED ORDER — FAMOTIDINE IN NACL 20-0.9 MG/50ML-% IV SOLN
20.0000 mg | Freq: Once | INTRAVENOUS | Status: AC
  Administered 2024-04-21: 20 mg via INTRAVENOUS
  Filled 2024-04-21: qty 50

## 2024-04-21 MED ORDER — CARFILZOMIB CHEMO INJECTION 60 MG
36.0000 mg/m2 | Freq: Once | INTRAVENOUS | Status: AC
  Administered 2024-04-21: 60 mg via INTRAVENOUS
  Filled 2024-04-21: qty 30

## 2024-04-21 MED ORDER — DEXAMETHASONE 4 MG PO TABS
8.0000 mg | ORAL_TABLET | Freq: Once | ORAL | Status: AC
Start: 2024-04-21 — End: 2024-04-21
  Administered 2024-04-21: 8 mg via ORAL
  Filled 2024-04-21: qty 2

## 2024-04-21 NOTE — Assessment & Plan Note (Signed)
 Checking lipid panel and adjust as needed.

## 2024-04-21 NOTE — Patient Instructions (Signed)

## 2024-04-21 NOTE — Assessment & Plan Note (Signed)
 Controlled on zoloft 100 mg daily and will continue.

## 2024-04-21 NOTE — Assessment & Plan Note (Signed)
 Flu shot yearly. Pneumonia complete. Shingrix due at pharmacy. Tetanus due at pharmacy. Colonoscopy aged out. Mammogram aged out, pap smear aged out and dexa aged out. Counseled about sun safety and mole surveillance. Counseled about the dangers of distracted driving. Given 10 year screening recommendations.

## 2024-04-21 NOTE — Assessment & Plan Note (Signed)
 Checking UACR, HgA1c, lipid panel. Adjust as needed and is on steroids for cancer treatment which likely causes high sugars. Goal <8% HgA1c.

## 2024-04-21 NOTE — Assessment & Plan Note (Signed)
 POC U/A done and treatment with macrobid  done to cover given immunocompromise.

## 2024-04-24 ENCOUNTER — Encounter: Payer: Self-pay | Admitting: Internal Medicine

## 2024-04-26 ENCOUNTER — Ambulatory Visit: Payer: Self-pay

## 2024-05-01 ENCOUNTER — Encounter: Payer: Self-pay | Admitting: Hematology

## 2024-05-01 ENCOUNTER — Other Ambulatory Visit: Payer: Self-pay

## 2024-05-01 DIAGNOSIS — C9000 Multiple myeloma not having achieved remission: Secondary | ICD-10-CM

## 2024-05-02 ENCOUNTER — Encounter: Payer: Self-pay | Admitting: Hematology

## 2024-05-02 MED ORDER — OXYCODONE HCL 10 MG PO TABS: 10.0000 mg | ORAL_TABLET | Freq: Four times a day (QID) | ORAL | 0 refills | Status: DC | PRN

## 2024-05-04 NOTE — Progress Notes (Signed)
 HEMATOLOGY/ONCOLOGY CLINIC NOTE  Date of Service: 05/05/2024   Patient Care Team: Adelia Homestead, MD as PCP - General (Internal Medicine) Pietro Bridegroom, MD (Inactive) as Consulting Physician (Gastroenterology) Lillian Rein, MD as Consulting Physician (Gynecology) Dayne Even, MD as Consulting Physician (Orthopedic Surgery) Faustina Hood, MD as Consulting Physician (Pulmonary Disease) Amedeo Jupiter, MD (Ophthalmology)  CHIEF COMPLAINTS/PURPOSE OF VISIT:  Follow-up for continued evaluation and management of multiple myeloma  HISTORY OF PRESENTING ILLNESS:  Please see previous notes for details on initial presentation  Current Treatment: Carfilzomib  + Revlimid  maintenance.  INTERVAL HISTORY:   Faith Orr is a 83 y.o. female who is here for continued evaluation and management of the patient's multiple myeloma. She is here to start cycle 16 day 15 of her treatment.   Patient was last seen by me on 04/07/2024 and reported stable back issus. She also reported a previous upper respiratory cold infection which had resolved.   Patient presents with a walker during today's visit. She reports no new concerns over the last several months.   Patient reports that she has stopped her Fentanyl  patch and notes that she cannot tell a difference. She continues to use Oxycodone  twice daily.  She reports that she saw her PCP, Dr. Nicolette Barrio, on 04/19/2024 due to feeling like she had a UTI. Patient tested negative for UTI, but was empirically treated with macrobid  one tablet twice a day for 5 days, which improved symptoms. She notes that her A1C was found to be 6.1. Patient was continued on Metformin  twice daily by her PCP.   She reports that yesterday, she endorsed generalized pain everywhere, including joint pain in the shoulders and head pain. Use of pain medication improved symptoms. Patient feels better today.   Her fatigue has been generally manageable.   Patient reports  lower back discomfort on palpation. She denies any abdominal pain, change in bowel habits, or leg swelling.  MEDICAL HISTORY:  Past Medical History:  Diagnosis Date   Allergy    seasonal   Asthma    DEPRESSION    DIABETES MELLITUS, TYPE II    Diverticulosis    HYPERLIPIDEMIA    Macular degeneration of left eye    mild, Dr.Hecker   Obesity, unspecified    Osteoarthritis of both knees    OSTEOPENIA    Osteopenia    URINARY INCONTINENCE     SURGICAL HISTORY: Past Surgical History:  Procedure Laterality Date   CATARACT EXTRACTION Left 05/24/2018   CESAREAN SECTION  01/1973   CYSTOSCOPY/URETEROSCOPY/HOLMIUM LASER/STENT PLACEMENT Right 09/20/2019   Procedure: CYSTOSCOPY/URETEROSCOPY/HOLMIUM LASER/STENT PLACEMENT;  Surgeon: Samson Croak, MD;  Location: [facility];  Service: Urology;  Laterality: Right;   FRACTURE SURGERY     IR BONE MARROW BIOPSY & ASPIRATION  02/16/2023   IR IMAGING GUIDED PORT INSERTION  02/20/2019   left wrist surgery  2008   By Dr. Ira Mann   right ankle  1994    SOCIAL HISTORY: Social History   Socioeconomic History   Marital status: Married    Spouse name: Not on file   Number of children: 1   Years of education: Not on file   Highest education level: Not on file  Occupational History    Employer: [employer]  Tobacco Use   Smoking status: Never   Smokeless tobacco: Never   Tobacco comments:    Lives with partner Vito Grippe) and son  Vaping Use   Vaping status: Never  Used  Substance and Sexual Activity   Alcohol use: No    Alcohol/week: 0.0 standard drinks of alcohol   Drug use: No   Sexual activity: Never    Partners: Female    Birth control/protection: Post-menopausal    Comment: Lives with female partner (annette hicks) and 26 yo son  Other Topics Concern   Not on file  Social History Narrative   Not on file   Social Drivers of Health   Financial Resource Strain: Low Risk  (09/09/2023)   Overall  Financial Resource Strain (CARDIA)    Difficulty of Paying Living Expenses: Not hard at all  Food Insecurity: No Food Insecurity (09/09/2023)   Hunger Vital Sign    Worried About Running Out of Food in the Last Year: Never true    Ran Out of Food in the Last Year: Never true  Transportation Needs: No Transportation Needs (09/09/2023)   PRAPARE - Administrator, Civil Service (Medical): No    Lack of Transportation (Non-Medical): No  Physical Activity: Inactive (09/09/2023)   Exercise Vital Sign    Days of Exercise per Week: 0 days    Minutes of Exercise per Session: 0 min  Stress: No Stress Concern Present (09/09/2023)   [employer] of Occupational Health - Occupational Stress Questionnaire    Feeling of Stress : Not at all  Social Connections: Socially Integrated (09/09/2023)   Social Connection and Isolation Panel [NHANES]    Frequency of Communication with Friends and Family: More than three times a week    Frequency of Social Gatherings with Friends and Family: More than three times a week    Attends Religious Services: More than 4 times per year    Active Member of [employer] or Organizations: Yes    Attends Engineer, structural: More than 4 times per year    Marital Status: Married  Catering manager Violence: Not At Risk (05/08/2021)   Humiliation, Afraid, Rape, and Kick questionnaire    Fear of Current or Ex-Partner: No    Emotionally Abused: No    Physically Abused: No    Sexually Abused: No    FAMILY HISTORY: Family History  Problem Relation Age of Onset   Diabetes Father    Hyperlipidemia Father    Heart disease Father    Cancer Father    Hypertension Father    Colon cancer Paternal Grandmother 53   Osteoporosis Mother    Protein S deficiency Mother    Hyperlipidemia Mother    Multiple sclerosis Daughter    Cancer Other        bladder   Breast cancer Neg Hx     ALLERGIES:  is allergic to levofloxacin , penicillins, aleve [naproxen sodium],  and sulfonamide derivatives.  MEDICATIONS:  Current Outpatient Medications  Medication Sig Dispense Refill   acyclovir  (ZOVIRAX ) 400 MG tablet TAKE 1 TABLET(400 MG) BY MOUTH TWICE DAILY 60 tablet 5   Blood Glucose Monitoring Suppl (FREESTYLE FREEDOM LITE) W/DEVICE KIT Use to check blood sugars twice a day Dx 250.00 1 each 0   Calcium  Carbonate-Vitamin D  600-400 MG-UNIT tablet Take 1 tablet by mouth 2 (two) times daily.     dexamethasone  (DECADRON ) 4 MG tablet Take 5 tablets (20 mg total) by mouth once a week. On D22 of each cycle of treatment 20 tablet 5   ELIQUIS  2.5 MG TABS tablet TAKE 1 TABLET(2.5 MG) BY MOUTH TWICE DAILY 60 tablet 2   fentaNYL  (DURAGESIC ) 12 MCG/HR Place 1 patch onto the  skin every 3 (three) days. (Patient not taking: Reported on 04/19/2024) 15 patch 0   fluticasone  (FLONASE ) 50 MCG/ACT nasal spray Place 1 spray into both nostrils daily. 48 g 3   gabapentin  (NEURONTIN ) 100 MG capsule Take 1 capsule (100 mg total) by mouth 2 (two) times daily. (Patient not taking: Reported on 04/19/2024) 60 capsule 0   glucose blood (FREESTYLE LITE) test strip CHECK BLOOD SUGAR TWICE DAILY AS DIRECTED Dx 250.00 180 each 3   Lancets (FREESTYLE) lancets Use twice daily to check sugars. 100 each 11   lenalidomide  (REVLIMID ) 5 MG capsule TAKE 1 CAPSULE BY MOUTH DAILY  FOR 21 DAYS, THEN 7 DAYS OFF 21 capsule 0   lidocaine  (LIDODERM ) 5 % Place 1 patch onto the skin daily. Remove & Discard patch within 12 hours or as directed by MD 30 patch 0   lidocaine -prilocaine  (EMLA ) cream APPLY 1 APPLICATION TO THE AFFECTED AREA AS NEEDED. USE PRIOR TO PORT ACCESS (Patient not taking: Reported on 04/19/2024) 30 g 0   meclizine  (ANTIVERT ) 25 MG tablet Take 1 tablet (25 mg total) by mouth every 8 (eight) hours as needed for dizziness. 20 tablet 0   metFORMIN  (GLUCOPHAGE -XR) 500 MG 24 hr tablet Take 1 tablet (500 mg total) by mouth 2 (two) times daily with a meal. 180 tablet 3   metroNIDAZOLE  (METROGEL ) 1 % gel Apply  topically in the morning and at bedtime. 60 g 1   Multiple Vitamins-Minerals (ICAPS) CAPS Take 1 capsule by mouth daily after breakfast.     ondansetron  (ZOFRAN ) 8 MG tablet TAKE 1 TABLET(8 MG) BY MOUTH TWICE DAILY AS NEEDED FOR NAUSEA OR VOMITING 30 tablet 1   Oxycodone  HCl 10 MG TABS Take 1 tablet (10 mg total) by mouth every 6 (six) hours as needed. 90 tablet 0   pantoprazole  (PROTONIX ) 20 MG tablet TAKE 1 TABLET(20 MG) BY MOUTH DAILY 30 tablet 2   polyethylene glycol (MIRALAX / GLYCOLAX) packet Take 17 g by mouth daily after breakfast.     potassium chloride  SA (KLOR-CON  M) 20 MEQ tablet TAKE 2 TABLETS(40 MEQ) BY MOUTH TWICE DAILY 360 tablet 1   prochlorperazine  (COMPAZINE ) 10 MG tablet Take 1 tablet (10 mg total) by mouth every 6 (six) hours as needed (Nausea or vomiting). 30 tablet 1   senna-docusate (SENNA S) 8.6-50 MG tablet Take 2 tablets by mouth at bedtime. 60 tablet 2   sertraline  (ZOLOFT ) 100 MG tablet Take 1 tablet (100 mg total) by mouth daily. 90 tablet 3   simvastatin  (ZOCOR ) 20 MG tablet Take 1 tablet (20 mg total) by mouth daily at 6 PM. 90 tablet 3   tizanidine  (ZANAFLEX ) 2 MG capsule Take 1 capsule (2 mg total) by mouth in the morning and at bedtime. 20 capsule 0   Vitamin D , Ergocalciferol , (DRISDOL ) 1.25 MG (50000 UNIT) CAPS capsule TAKE 1 CAPSULE BY MOUTH EVERY 7 DAYS 12 capsule 0   No current facility-administered medications for this visit.   Facility-Administered Medications Ordered in Other Visits  Medication Dose Route Frequency Provider Last Rate Last Admin   heparin  lock flush 100 unit/mL  500 Units Intracatheter Once PRN Anastasiya Gowin Kishore, MD       sodium chloride  flush (NS) 0.9 % injection 10 mL  10 mL Intracatheter PRN Sunnie Odden Kishore, MD   10 mL at 12/17/23 1336    REVIEW OF SYSTEMS:    10 Point review of Systems was done is negative except as noted above.   PHYSICAL EXAMINATION:  ECOG FS:2 - Symptomatic, <50% confined to bed  .LMP 10/09/2012     GENERAL:alert, in no acute distress and comfortable SKIN: no acute rashes, no significant lesions EYES: conjunctiva are pink and non-injected, sclera anicteric OROPHARYNX: MMM, no exudates, no oropharyngeal erythema or ulceration NECK: supple, no JVD LYMPH:  no palpable lymphadenopathy in the cervical, axillary or inguinal regions LUNGS: clear to auscultation b/l with normal respiratory effort HEART: regular rate & rhythm ABDOMEN:  normoactive bowel sounds , non tender, not distended. Extremity: no pedal edema PSYCH: alert & oriented x 3 with fluent speech NEURO: no focal motor/sensory deficits   LABS .    Latest Ref Rng & Units 04/21/2024   11:45 AM 04/07/2024   10:21 AM 03/10/2024   11:27 AM  CBC  WBC 4.0 - 10.5 K/uL 4.8  4.0  5.5   Hemoglobin 12.0 - 15.0 g/dL 40.9  81.1  91.4   Hematocrit 36.0 - 46.0 % 37.7  36.6  37.6   Platelets 150 - 400 K/uL 155  154  213    .    Latest Ref Rng & Units 04/21/2024   11:45 AM 04/07/2024   10:21 AM 03/10/2024   11:27 AM  CMP  Glucose 70 - 99 mg/dL 782  956  213   BUN 8 - 23 mg/dL 15  19  18    Creatinine 0.44 - 1.00 mg/dL 0.86  5.78  4.69   Sodium 135 - 145 mmol/L 140  138  137   Potassium 3.5 - 5.1 mmol/L 3.6  3.9  3.7   Chloride 98 - 111 mmol/L 107  105  105   CO2 22 - 32 mmol/L 24  28  23    Calcium  8.9 - 10.3 mg/dL 9.4  9.7  9.8   Total Protein 6.5 - 8.1 g/dL 6.0  6.0  6.2   Total Bilirubin 0.0 - 1.2 mg/dL 0.7  0.7  0.6   Alkaline Phos 38 - 126 U/L 83  86  92   AST 15 - 41 U/L 12  15  12    ALT 0 - 44 U/L 14  16  12       09/18/2019 BM Bx Report ([id number])   09/18/2019 FISH Panel    05/30/2019 BM Bx   01/06/2019 BM Bx:     01/06/19 Cytogenetics:      05/30/19 BM Biopsy:   09/18/2019 FISH Panel    09/18/2019 BM Surgical Pathology ([id number])     RADIOGRAPHIC STUDIES: I have personally reviewed the radiological images as listed and agreed with the findings in the report. No results  found.    ASSESSMENT & PLAN:   83 y.o. female with  1.  Nonsecretory multiple Myeloma, RISS Stage III  Labs upon initial presentation from 12/08/18, blood counts are normal including WBC at 7.1k, HGB at 13.1, and PLT at 245k. Calcium  normal at 10.3. Creatinine normal at 0.63. M spike at 0.5g. 12/13/18 Bone Scan revealed Multifocal uptake throughout the skeleton, consistent with diffuse metastatic disease. Primary tumor is not specified. 2. Uptake in the proximal right femur, consistent with lytic lesions. 3. Uptake in the ribs bilaterally as described. 4. Lesions in the proximal left humerus. 5. Diffuse uptake throughout the skull consistent with metastatic disease. 6. Right paramedian uptake at the manubrium.  12/13/18 CT Right Femur revealed Numerous lytic lesions involving the right femur and a lytic lesion in the left inferior pubic ramus. Overall appearance is most concerning for multiple myeloma  12/27/18 Pretreatment 24hour UPEP  observed an M spike at 18mg , and showed 199mg  total protein/day.  12/27/18 Pretreatment MMP revealed M Protein at 0.5g with IgG Lambda specificity. Kappa:Lambda light chain ratio at 0.13, with Lambda at 40.3. There is less abnormal protein and light chains than I would expect from 30% plasma cells, which suggests hypo-secretory or non-secretory neoplastic plasma cells. Will have an impact in assessing response. 01/05/19 PET/CT revealed Innumerable lytic lesions in the skeleton compatible with myeloma. Most of the larger lesions are hypermetabolic, for example including a left proximal humeral shaft lesion with maximum SUV of 8.1 and a 2.8 cm lesion in the left T9 vertebral body with maximum SUV 5.1. Most of the smaller lytic lesions, and some of the larger lesions, do not demonstrate accentuated metabolic activity. 2. 1.2 cm in short axis lymph node in the left parapharyngeal space is hypermetabolic with maximum SUV 11.8. I do not see a separate mass in the head and  neck to give rise to this hypermetabolic lymph node. 3. Mosaic attenuation in the lower lobes, nonspecific possibly from air trapping. 4.  Aortic Atherosclerosis 5. Heterogeneous activity in the liver, making it hard to exclude small liver lesions. Consider hepatic protocol MRI with and without contrast for definitive assessment. Nonobstructive right nephrolithiasis. Old granulomatous disease  01/06/19 Bone Marrow biopsy revealed interstitial increase in plasma cells (28% aspirate, 40% CD138 immunohistochemistry). Plasma cells negative for light chains consistent with a non or weakly secretory myeloma   01/06/19 Cytogenetics revealed 37% of cells with trisomy 11 or 11q deletion, and 40.5% of cells with 17p mutation  S/p 5 cycles of KRD treatment  05/31/19 BM Biopsy revealed mild atypical plasmacytosis at 5% with polytypic variation.   06/01/19 PET/CT revealed "Dominant lesion in the LEFT humerus is decreased significantly in metabolic activity. Additional hypermetabolic skeletal lytic lesions have decreased in metabolic activity or similar to comparison exam (01/05/2019). No evidence of disease progression. 2. Multiple additional lytic lesions do not have metabolic activity and unchanged. 3. No new skeletal lesions are identified. No soft tissue plasmacytoma identified. 4. Nodule / node in the LEFT parapharyngeal space which is intensely hypermetabolic not changed from prior. 5. New hypermetabolic LEFT lower lobe pulmonary nodule is indeterminate. Recommend close attention on follow-up 6. New obstructive hydronephrosis of the RIGHT kidney related to RIGHT UPJ stone."  09/18/2019 BM Bx Report which revealed "Slightly hypercellular bone marrow for age with trilineage hematopoiesis and 1% plasma cells."  09/14/2019 PET/CT Whole Body Scan ([id number]) which revealed "1. There widespread tiny lytic lesions compatible with multiple myeloma. Index larger lesions are generally similar to the prior exam, with  low-grade activity such as the left T9 vertebral body lesion with maximum SUV 4.5. Is mild increase in the activity associated with a mildly sclerotic left proximal humeral lesion, maximum SUV 4.8 (previously 3.5). 2. At the site of the prior left lower lobe nodule is currently more bandlike thickening, with maximum SUV only 1.9, probably benign, continued surveillance of this region suggested. 3. There several small but hypermetabolic lymph nodes. This includes a left parapharyngeal space node measuring 1.0 cm with maximum SUV 12.3 (stable); a left level IB lymph node measuring 0.5 cm with maximum SUV 4.8 (slightly larger than prior); and a left inguinal lymph node measuring 0.7 cm in short axis with maximum SUV 6.4 (previously 0.5 cm with maximum SUV 0.6). Significance of these lymph nodes uncertain, surveillance is recommended. 4. New 5 mm left lower lobe subpleural nodule on image 32/8, not appreciably hypermetabolic, surveillance suggested. 5.  Focal subcutaneous stranding along the left perineum measuring about 2.6 by 1.1 cm on image 221/4, maximum SUV 12.5. This was not present previously and is most likely inflammatory, although given the notable SUV, surveillance of this region is suggested. 6. Other imaging findings of potential clinical significance: Aortic Atherosclerosis (ICD10-I70.0). Coronary atherosclerosis. Old granulomatous disease. Mild right hydronephrosis due to a 7 mm right UPJ calculus. 2 mm right kidney upper pole nonobstructive renal calculus. Prominent stool throughout the colon favors constipation."  12/19/2019 Thoracic & Lumbar Spine MRI ([id number]) ([id number]) revealed "Suspected myeloma lesions at T9 and S1. No compression deformity or epidural disease.  01/18/2020 PET/CT ([id number]) which revealed "1. Stable lytic lesions throughout the skeleton. The larger lytic lesions which had mild metabolic activity on comparison exam now have background metabolic activity. No evidence of  active myeloma. No evidence of progression multiple myeloma.  No plasmacytoma 3. Hypermetabolic nodules in the LEFT neck may be associated deep tissues of the LEFT parotid gland. 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  01/17/19 MRI Liver revealed Several appreciable liver lesions all have benign imaging characteristics. No MRI findings of metastatic involvement of the liver. 2. Scattered bony lesions corresponding to the lytic lesions seen at PET-CT, compatible with active myeloma. 3. Aortic Atherosclerosis.  Mild cardiomegaly. 4. Diffuse hepatic steatosis.   3. Left lower lobe pulmonary nodule First seen on 06/01/19 PET/CT PET/CT 08/27/2020: No hypermetabolic mediastinal or hilar nodes. No suspicious pulmonary nodules on the CT scan.  4. 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 Being managed conservatively as per patient's preference.  No symptoms from this currently.  5. Mild chronic kidney disease CMP done 04/09/2022 showed creatinine of 1.17 and GFR est of 48 consistent with mild chronic kidney disease.  PLAN:  -Discussed lab results on 05/05/2024 in detail with patient. CBC showed WBC of 4.6K, hemoglobin of 12.8, and platelets of 170K. -CMP shows blood glucose levels elevated at 260 mg/dL; CMP is otherwise stable -patient stopped Fentanyl  patch -continue Oxycodone  as needed for back pain -answered all of patient's questions in detail  FOLLOW UP: ***  The total time spent in the appointment was *** minutes* .  All of the patient's questions were answered with apparent satisfaction. The patient knows to call the clinic with any problems, questions or  concerns.   Jacquelyn Matt MD MS AAHIVMS [facility] Hematology/Oncology Physician [facility]  .*Total Encounter Time as defined by the Centers for Medicare and Medicaid Services includes, in addition to the face-to-face time of a patient visit (documented in the note above) non-face-to-face time: obtaining and reviewing outside history, ordering and reviewing medications, tests or procedures, care coordination (communications with other health care professionals or caregivers) and documentation in the medical record.    I,Mitra Faeizi,acting as a Neurosurgeon for Jacquelyn Matt, MD.,have documented all relevant documentation on the behalf of Jacquelyn Matt, MD,as directed by  Jacquelyn Matt, MD while in the presence of Jacquelyn Matt, MD.  ***

## 2024-05-05 ENCOUNTER — Inpatient Hospital Stay

## 2024-05-05 ENCOUNTER — Inpatient Hospital Stay: Admitting: Hematology

## 2024-05-05 VITALS — BP 122/69 | HR 71 | Temp 97.5°F | Resp 18 | Wt 154.3 lb

## 2024-05-05 DIAGNOSIS — Z79899 Other long term (current) drug therapy: Secondary | ICD-10-CM | POA: Diagnosis not present

## 2024-05-05 DIAGNOSIS — C9000 Multiple myeloma not having achieved remission: Secondary | ICD-10-CM

## 2024-05-05 DIAGNOSIS — Z5112 Encounter for antineoplastic immunotherapy: Secondary | ICD-10-CM | POA: Diagnosis not present

## 2024-05-05 DIAGNOSIS — Z95828 Presence of other vascular implants and grafts: Secondary | ICD-10-CM

## 2024-05-05 DIAGNOSIS — Z7189 Other specified counseling: Secondary | ICD-10-CM

## 2024-05-05 DIAGNOSIS — Z5111 Encounter for antineoplastic chemotherapy: Secondary | ICD-10-CM | POA: Diagnosis not present

## 2024-05-05 LAB — CMP (CANCER CENTER ONLY)
ALT: 17 U/L (ref 0–44)
AST: 15 U/L (ref 15–41)
Albumin: 3.7 g/dL (ref 3.5–5.0)
Alkaline Phosphatase: 86 U/L (ref 38–126)
Anion gap: 8 (ref 5–15)
BUN: 16 mg/dL (ref 8–23)
CO2: 22 mmol/L (ref 22–32)
Calcium: 9.9 mg/dL (ref 8.9–10.3)
Chloride: 110 mmol/L (ref 98–111)
Creatinine: 0.84 mg/dL (ref 0.44–1.00)
GFR, Estimated: >60 mL/min (ref >60)
Glucose, Bld: 260 mg/dL — ABNORMAL HIGH (ref 70–99)
Potassium: 4 mmol/L (ref 3.5–5.1)
Sodium: 140 mmol/L (ref 135–145)
Total Bilirubin: 0.4 mg/dL (ref 0.0–1.2)
Total Protein: 6 g/dL — ABNORMAL LOW (ref 6.5–8.1)

## 2024-05-05 LAB — CBC WITH DIFFERENTIAL (CANCER CENTER ONLY)
Abs Immature Granulocytes: 0.01 10*3/uL (ref 0.00–0.07)
Basophils Absolute: 0.1 10*3/uL (ref 0.0–0.1)
Basophils Relative: 2 %
Eosinophils Absolute: 0 10*3/uL (ref 0.0–0.5)
Eosinophils Relative: 1 %
HCT: 38 % (ref 36.0–46.0)
Hemoglobin: 12.8 g/dL (ref 12.0–15.0)
Immature Granulocytes: 0 %
Lymphocytes Relative: 21 %
Lymphs Abs: 1 10*3/uL (ref 0.7–4.0)
MCH: 32.7 pg (ref 26.0–34.0)
MCHC: 33.7 g/dL (ref 30.0–36.0)
MCV: 96.9 fL (ref 80.0–100.0)
Monocytes Absolute: 1.2 10*3/uL — ABNORMAL HIGH (ref 0.1–1.0)
Monocytes Relative: 25 %
Neutro Abs: 2.4 10*3/uL (ref 1.7–7.7)
Neutrophils Relative %: 51 %
Platelet Count: 170 10*3/uL (ref 150–400)
RBC: 3.92 MIL/uL (ref 3.87–5.11)
RDW: 13.9 % (ref 11.5–15.5)
WBC Count: 4.6 10*3/uL (ref 4.0–10.5)
nRBC: 0 % (ref 0.0–0.2)

## 2024-05-05 MED ORDER — SODIUM CHLORIDE 0.9% FLUSH
10.0000 mL | Freq: Once | INTRAVENOUS | Status: AC
  Administered 2024-05-05: 10 mL

## 2024-05-05 MED ORDER — DEXTROSE 5 % IV SOLN
36.0000 mg/m2 | Freq: Once | INTRAVENOUS | Status: AC
  Administered 2024-05-05: 60 mg via INTRAVENOUS
  Filled 2024-05-05: qty 30

## 2024-05-05 MED ORDER — DEXAMETHASONE 4 MG PO TABS
8.0000 mg | ORAL_TABLET | Freq: Once | ORAL | Status: AC
  Administered 2024-05-05: 8 mg via ORAL
  Filled 2024-05-05: qty 2

## 2024-05-05 MED ORDER — SODIUM CHLORIDE 0.9 % IV SOLN: Freq: Once | INTRAVENOUS | Status: AC

## 2024-05-05 MED ORDER — ACETAMINOPHEN 325 MG PO TABS
650.0000 mg | ORAL_TABLET | Freq: Once | ORAL | Status: AC
  Administered 2024-05-05: 650 mg via ORAL
  Filled 2024-05-05: qty 2

## 2024-05-05 MED ORDER — DIPHENHYDRAMINE HCL 25 MG PO CAPS
25.0000 mg | ORAL_CAPSULE | Freq: Once | ORAL | Status: AC
  Administered 2024-05-05: 25 mg via ORAL
  Filled 2024-05-05: qty 1

## 2024-05-05 MED ORDER — PROCHLORPERAZINE MALEATE 10 MG PO TABS
10.0000 mg | ORAL_TABLET | Freq: Once | ORAL | Status: AC
  Administered 2024-05-05: 10 mg via ORAL
  Filled 2024-05-05: qty 1

## 2024-05-05 MED ORDER — FAMOTIDINE IN NACL 20-0.9 MG/50ML-% IV SOLN
20.0000 mg | Freq: Once | INTRAVENOUS | Status: AC
Start: 2024-05-05 — End: 2024-05-05
  Administered 2024-05-05: 20 mg via INTRAVENOUS
  Filled 2024-05-05: qty 50

## 2024-05-05 NOTE — Patient Instructions (Signed)

## 2024-05-11 ENCOUNTER — Encounter: Payer: Self-pay | Admitting: Hematology

## 2024-05-12 ENCOUNTER — Encounter: Payer: Self-pay | Admitting: Hematology

## 2024-05-15 ENCOUNTER — Other Ambulatory Visit: Payer: Self-pay | Admitting: Hematology

## 2024-05-15 DIAGNOSIS — Z7189 Other specified counseling: Secondary | ICD-10-CM

## 2024-05-15 DIAGNOSIS — C9000 Multiple myeloma not having achieved remission: Secondary | ICD-10-CM

## 2024-05-16 ENCOUNTER — Telehealth: Payer: Self-pay | Admitting: Hematology

## 2024-05-16 NOTE — Telephone Encounter (Signed)
 Left a voicemail with the schedule changes made. Informed the patient that we were able to accommodate her Friday request.

## 2024-05-18 ENCOUNTER — Other Ambulatory Visit

## 2024-05-18 ENCOUNTER — Ambulatory Visit

## 2024-05-19 ENCOUNTER — Inpatient Hospital Stay

## 2024-05-19 ENCOUNTER — Inpatient Hospital Stay: Attending: Hematology

## 2024-05-19 ENCOUNTER — Other Ambulatory Visit: Payer: Self-pay | Admitting: Hematology

## 2024-05-19 VITALS — BP 128/61 | HR 75 | Temp 98.5°F | Resp 20 | Wt 152.5 lb

## 2024-05-19 VITALS — BP 123/58 | HR 66 | Temp 98.0°F | Resp 16

## 2024-05-19 DIAGNOSIS — C9000 Multiple myeloma not having achieved remission: Secondary | ICD-10-CM | POA: Diagnosis not present

## 2024-05-19 DIAGNOSIS — Z7189 Other specified counseling: Secondary | ICD-10-CM

## 2024-05-19 DIAGNOSIS — Z79899 Other long term (current) drug therapy: Secondary | ICD-10-CM | POA: Insufficient documentation

## 2024-05-19 DIAGNOSIS — Z95828 Presence of other vascular implants and grafts: Secondary | ICD-10-CM

## 2024-05-19 DIAGNOSIS — Z5112 Encounter for antineoplastic immunotherapy: Secondary | ICD-10-CM | POA: Diagnosis not present

## 2024-05-19 LAB — CBC WITH DIFFERENTIAL (CANCER CENTER ONLY)
Abs Immature Granulocytes: 0.02 10*3/uL (ref 0.00–0.07)
Basophils Absolute: 0 10*3/uL (ref 0.0–0.1)
Basophils Relative: 1 %
Eosinophils Absolute: 0.1 10*3/uL (ref 0.0–0.5)
Eosinophils Relative: 2 %
HCT: 38.7 % (ref 36.0–46.0)
Hemoglobin: 13.3 g/dL (ref 12.0–15.0)
Immature Granulocytes: 0 %
Lymphocytes Relative: 22 %
Lymphs Abs: 1 10*3/uL (ref 0.7–4.0)
MCH: 32.9 pg (ref 26.0–34.0)
MCHC: 34.4 g/dL (ref 30.0–36.0)
MCV: 95.8 fL (ref 80.0–100.0)
Monocytes Absolute: 1.2 10*3/uL — ABNORMAL HIGH (ref 0.1–1.0)
Monocytes Relative: 26 %
Neutro Abs: 2.2 10*3/uL (ref 1.7–7.7)
Neutrophils Relative %: 49 %
Platelet Count: 144 10*3/uL — ABNORMAL LOW (ref 150–400)
RBC: 4.04 MIL/uL (ref 3.87–5.11)
RDW: 13.8 % (ref 11.5–15.5)
WBC Count: 4.5 10*3/uL (ref 4.0–10.5)
nRBC: 0 % (ref 0.0–0.2)

## 2024-05-19 LAB — CMP (CANCER CENTER ONLY)
ALT: 16 U/L (ref 0–44)
AST: 13 U/L — ABNORMAL LOW (ref 15–41)
Albumin: 3.9 g/dL (ref 3.5–5.0)
Alkaline Phosphatase: 89 U/L (ref 38–126)
Anion gap: 7 (ref 5–15)
BUN: 16 mg/dL (ref 8–23)
CO2: 25 mmol/L (ref 22–32)
Calcium: 9.8 mg/dL (ref 8.9–10.3)
Chloride: 106 mmol/L (ref 98–111)
Creatinine: 0.87 mg/dL (ref 0.44–1.00)
GFR, Estimated: >60 mL/min (ref >60)
Glucose, Bld: 233 mg/dL — ABNORMAL HIGH (ref 70–99)
Potassium: 4.2 mmol/L (ref 3.5–5.1)
Sodium: 138 mmol/L (ref 135–145)
Total Bilirubin: 0.6 mg/dL (ref 0.0–1.2)
Total Protein: 6.1 g/dL — ABNORMAL LOW (ref 6.5–8.1)

## 2024-05-19 MED ORDER — ACETAMINOPHEN 325 MG PO TABS
650.0000 mg | ORAL_TABLET | Freq: Once | ORAL | Status: AC
  Administered 2024-05-19: 650 mg via ORAL
  Filled 2024-05-19: qty 2

## 2024-05-19 MED ORDER — SODIUM CHLORIDE 0.9% FLUSH
10.0000 mL | INTRAVENOUS | Status: DC | PRN
  Administered 2024-05-19: 10 mL

## 2024-05-19 MED ORDER — FAMOTIDINE IN NACL 20-0.9 MG/50ML-% IV SOLN
20.0000 mg | Freq: Once | INTRAVENOUS | Status: AC
  Administered 2024-05-19: 20 mg via INTRAVENOUS
  Filled 2024-05-19: qty 50

## 2024-05-19 MED ORDER — DEXTROSE 5 % IV SOLN
36.0000 mg/m2 | Freq: Once | INTRAVENOUS | Status: AC
  Administered 2024-05-19: 60 mg via INTRAVENOUS
  Filled 2024-05-19: qty 30

## 2024-05-19 MED ORDER — DEXAMETHASONE 4 MG PO TABS
8.0000 mg | ORAL_TABLET | Freq: Once | ORAL | Status: AC
  Administered 2024-05-19: 8 mg via ORAL
  Filled 2024-05-19: qty 2

## 2024-05-19 MED ORDER — SODIUM CHLORIDE 0.9 % IV SOLN: Freq: Once | INTRAVENOUS | Status: DC

## 2024-05-19 MED ORDER — SODIUM CHLORIDE 0.9% FLUSH
10.0000 mL | Freq: Once | INTRAVENOUS | Status: AC
  Administered 2024-05-19: 10 mL

## 2024-05-19 MED ORDER — PROCHLORPERAZINE MALEATE 10 MG PO TABS
10.0000 mg | ORAL_TABLET | Freq: Once | ORAL | Status: AC
  Administered 2024-05-19: 10 mg via ORAL
  Filled 2024-05-19: qty 1

## 2024-05-19 MED ORDER — HEPARIN SOD (PORK) LOCK FLUSH 100 UNIT/ML IV SOLN
500.0000 [IU] | Freq: Once | INTRAVENOUS | Status: AC | PRN
  Administered 2024-05-19: 500 [IU]

## 2024-05-19 MED ORDER — SODIUM CHLORIDE 0.9 % IV SOLN: Freq: Once | INTRAVENOUS | Status: AC

## 2024-05-19 MED ORDER — DIPHENHYDRAMINE HCL 25 MG PO CAPS
25.0000 mg | ORAL_CAPSULE | Freq: Once | ORAL | Status: AC
  Administered 2024-05-19: 25 mg via ORAL
  Filled 2024-05-19: qty 1

## 2024-05-22 ENCOUNTER — Other Ambulatory Visit: Payer: Self-pay | Admitting: Hematology

## 2024-05-22 ENCOUNTER — Encounter: Payer: Self-pay | Admitting: Hematology

## 2024-05-22 DIAGNOSIS — C9000 Multiple myeloma not having achieved remission: Secondary | ICD-10-CM

## 2024-06-02 ENCOUNTER — Inpatient Hospital Stay: Admitting: Physician Assistant

## 2024-06-02 ENCOUNTER — Inpatient Hospital Stay

## 2024-06-02 VITALS — BP 137/57 | HR 58 | Temp 97.5°F | Resp 18 | Wt 153.3 lb

## 2024-06-02 VITALS — BP 109/61 | HR 55 | Temp 97.5°F | Resp 16

## 2024-06-02 DIAGNOSIS — Z7189 Other specified counseling: Secondary | ICD-10-CM

## 2024-06-02 DIAGNOSIS — C9000 Multiple myeloma not having achieved remission: Secondary | ICD-10-CM

## 2024-06-02 DIAGNOSIS — Z5112 Encounter for antineoplastic immunotherapy: Secondary | ICD-10-CM | POA: Diagnosis not present

## 2024-06-02 DIAGNOSIS — Z95828 Presence of other vascular implants and grafts: Secondary | ICD-10-CM

## 2024-06-02 DIAGNOSIS — Z79899 Other long term (current) drug therapy: Secondary | ICD-10-CM | POA: Diagnosis not present

## 2024-06-02 LAB — CMP (CANCER CENTER ONLY)
ALT: 12 U/L (ref 0–44)
AST: 12 U/L — ABNORMAL LOW (ref 15–41)
Albumin: 3.9 g/dL (ref 3.5–5.0)
Alkaline Phosphatase: 79 U/L (ref 38–126)
Anion gap: 7 (ref 5–15)
BUN: 22 mg/dL (ref 8–23)
CO2: 22 mmol/L (ref 22–32)
Calcium: 9.8 mg/dL (ref 8.9–10.3)
Chloride: 108 mmol/L (ref 98–111)
Creatinine: 0.93 mg/dL (ref 0.44–1.00)
GFR, Estimated: >60 mL/min (ref >60)
Glucose, Bld: 221 mg/dL — ABNORMAL HIGH (ref 70–99)
Potassium: 4 mmol/L (ref 3.5–5.1)
Sodium: 137 mmol/L (ref 135–145)
Total Bilirubin: 0.5 mg/dL (ref 0.0–1.2)
Total Protein: 6.2 g/dL — ABNORMAL LOW (ref 6.5–8.1)

## 2024-06-02 LAB — CBC WITH DIFFERENTIAL (CANCER CENTER ONLY)
Abs Immature Granulocytes: 0.01 10*3/uL (ref 0.00–0.07)
Basophils Absolute: 0.1 10*3/uL (ref 0.0–0.1)
Basophils Relative: 1 %
Eosinophils Absolute: 0 10*3/uL (ref 0.0–0.5)
Eosinophils Relative: 1 %
HCT: 38.8 % (ref 36.0–46.0)
Hemoglobin: 13.3 g/dL (ref 12.0–15.0)
Immature Granulocytes: 0 %
Lymphocytes Relative: 25 %
Lymphs Abs: 1.1 10*3/uL (ref 0.7–4.0)
MCH: 33 pg (ref 26.0–34.0)
MCHC: 34.3 g/dL (ref 30.0–36.0)
MCV: 96.3 fL (ref 80.0–100.0)
Monocytes Absolute: 1 10*3/uL (ref 0.1–1.0)
Monocytes Relative: 23 %
Neutro Abs: 2.3 10*3/uL (ref 1.7–7.7)
Neutrophils Relative %: 50 %
Platelet Count: 180 10*3/uL (ref 150–400)
RBC: 4.03 MIL/uL (ref 3.87–5.11)
RDW: 14 % (ref 11.5–15.5)
WBC Count: 4.6 10*3/uL (ref 4.0–10.5)
nRBC: 0 % (ref 0.0–0.2)

## 2024-06-02 MED ORDER — DIPHENHYDRAMINE HCL 25 MG PO CAPS
25.0000 mg | ORAL_CAPSULE | Freq: Once | ORAL | Status: AC
  Administered 2024-06-02: 25 mg via ORAL
  Filled 2024-06-02: qty 1

## 2024-06-02 MED ORDER — DEXAMETHASONE 4 MG PO TABS
8.0000 mg | ORAL_TABLET | Freq: Once | ORAL | Status: AC
  Administered 2024-06-02: 8 mg via ORAL
  Filled 2024-06-02: qty 2

## 2024-06-02 MED ORDER — SODIUM CHLORIDE 0.9 % IV SOLN: Freq: Once | INTRAVENOUS | Status: AC

## 2024-06-02 MED ORDER — SODIUM CHLORIDE 0.9% FLUSH
10.0000 mL | INTRAVENOUS | Status: DC | PRN
  Administered 2024-06-02: 10 mL

## 2024-06-02 MED ORDER — HEPARIN SOD (PORK) LOCK FLUSH 100 UNIT/ML IV SOLN
500.0000 [IU] | Freq: Once | INTRAVENOUS | Status: AC | PRN
  Administered 2024-06-02: 500 [IU]

## 2024-06-02 MED ORDER — DEXTROSE 5 % IV SOLN
36.0000 mg/m2 | Freq: Once | INTRAVENOUS | Status: AC
  Administered 2024-06-02: 60 mg via INTRAVENOUS
  Filled 2024-06-02: qty 30

## 2024-06-02 MED ORDER — ACETAMINOPHEN 325 MG PO TABS
650.0000 mg | ORAL_TABLET | Freq: Once | ORAL | Status: AC
  Administered 2024-06-02: 650 mg via ORAL
  Filled 2024-06-02: qty 2

## 2024-06-02 MED ORDER — SODIUM CHLORIDE 0.9% FLUSH
10.0000 mL | Freq: Once | INTRAVENOUS | Status: AC
  Administered 2024-06-02: 10 mL

## 2024-06-02 MED ORDER — FAMOTIDINE IN NACL 20-0.9 MG/50ML-% IV SOLN
20.0000 mg | Freq: Once | INTRAVENOUS | Status: AC
  Administered 2024-06-02: 20 mg via INTRAVENOUS
  Filled 2024-06-02: qty 50

## 2024-06-02 MED ORDER — PROCHLORPERAZINE MALEATE 10 MG PO TABS
10.0000 mg | ORAL_TABLET | Freq: Once | ORAL | Status: AC
  Administered 2024-06-02: 10 mg via ORAL
  Filled 2024-06-02: qty 1

## 2024-06-02 MED ORDER — DEXTROSE 5 % IV SOLN
36.0000 mg/m2 | Freq: Once | INTRAVENOUS | Status: DC
  Filled 2024-06-02: qty 30

## 2024-06-02 NOTE — Patient Instructions (Signed)
 [facility] - A DEPT OF [facility]  Discharge Instructions: Thank you for choosing [facility] to provide your oncology and hematology care.   If you have a lab appointment with the Cancer Center, please go directly to the Cancer Center and check in at the registration area.   Wear comfortable clothing and clothing appropriate for easy access to any Portacath or PICC line.   We strive to give you quality time with your provider. You may need to reschedule your appointment if you arrive late (15 or more minutes).  Arriving late affects you and other patients whose appointments are after yours.  Also, if you miss three or more appointments without notifying the office, you may be dismissed from the clinic at the provider's discretion.      For prescription refill requests, have your pharmacy contact our office and allow 72 hours for refills to be completed.    Today you received the following chemotherapy and/or immunotherapy agent: Carfilzomib (Kyprolis)      To help prevent nausea and vomiting after your treatment, we encourage you to take your nausea medication as directed.  BELOW ARE SYMPTOMS THAT SHOULD BE REPORTED IMMEDIATELY: *FEVER GREATER THAN 100.4 F (38 C) OR HIGHER *CHILLS OR SWEATING *NAUSEA AND VOMITING THAT IS NOT CONTROLLED WITH YOUR NAUSEA MEDICATION *UNUSUAL SHORTNESS OF BREATH *UNUSUAL BRUISING OR BLEEDING *URINARY PROBLEMS (pain or burning when urinating, or frequent urination) *BOWEL PROBLEMS (unusual diarrhea, constipation, pain near the anus) TENDERNESS IN MOUTH AND THROAT WITH OR WITHOUT PRESENCE OF ULCERS (sore throat, sores in mouth, or a toothache) UNUSUAL RASH, SWELLING OR PAIN  UNUSUAL VAGINAL DISCHARGE OR ITCHING   Items with * indicate a potential emergency and should be followed up as soon as possible or go to the Emergency Department if any problems should occur.  Please show the CHEMOTHERAPY ALERT CARD or  IMMUNOTHERAPY ALERT CARD at check-in to the Emergency Department and triage nurse.  Should you have questions after your visit or need to cancel or reschedule your appointment, please contact [facility]  Dept: [phone]  and follow the prompts.  Office hours are 8:00 a.m. to 4:30 p.m. Monday - Friday. Please note that voicemails left after 4:00 p.m. may not be returned until the following business day.  We are closed weekends and major holidays. You have access to a nurse at all times for urgent questions. Please call the main number to the clinic Dept: [phone] and follow the prompts.   For any non-urgent questions, you may also contact your provider using MyChart. We now offer e-Visits for anyone 81 and older to request care online for non-urgent symptoms. For details visit [email or web address].   Also download the MyChart app! Go to the app store, search "MyChart", open the app, select [place], and log in with your MyChart username and password.

## 2024-06-04 ENCOUNTER — Encounter: Payer: Self-pay | Admitting: Hematology

## 2024-06-04 NOTE — Progress Notes (Signed)
 HEMATOLOGY/ONCOLOGY CLINIC NOTE  Date of Service: 06/02/2024   Patient Care Team: Rollene Almarie LABOR, MD as PCP - General (Internal Medicine) Obie Princella HERO, MD (Inactive) as Consulting Physician (Gastroenterology) Cleotilde Ronal RAMAN, MD as Consulting Physician (Gynecology) Sheril Coy, MD as Consulting Physician (Orthopedic Surgery) Neysa Reggy BIRCH, MD as Consulting Physician (Pulmonary Disease) Cleatus Collar, MD (Ophthalmology)  CHIEF COMPLAINTS/PURPOSE OF VISIT:  Follow-up for continued evaluation and management of multiple myeloma  HISTORY OF PRESENTING ILLNESS:  Please see previous notes for details on initial presentation  Current Treatment: Carfilzomib  + Revlimid  maintenance.  INTERVAL HISTORY:   Faith Orr is a 83 y.o. female who is here for continued evaluation and management of the patient's multiple myeloma. Patient was last seen by Dr. Onesimo on 05/05/2024. She presents unaccompanied for this visit.   Ms. Tellado reports that she is tolerating treatment without any significant or new symptoms.  Her energy and appetite are stable. She is able to complete her baseline ADLs without any issues. She reports occasional nausea without vomiting episodes. She does have episodes of diarrhea intermittently in the week. She denies any need for antidiarrheals at this time. She denies easy bruising or signs of overt bleeding. She denies fevers, chills, sweats, shortness of breath, chest pain or cough.  She has no other complaints.  MEDICAL HISTORY:  Past Medical History:  Diagnosis Date   Allergy    seasonal   Asthma    DEPRESSION    DIABETES MELLITUS, TYPE II    Diverticulosis    HYPERLIPIDEMIA    Macular degeneration of left eye    mild, Dr.Hecker   Obesity, unspecified    Osteoarthritis of both knees    OSTEOPENIA    Osteopenia    URINARY INCONTINENCE     SURGICAL HISTORY: Past Surgical History:  Procedure Laterality Date   CATARACT EXTRACTION Left  05/24/2018   CESAREAN SECTION  01/1973   CYSTOSCOPY/URETEROSCOPY/HOLMIUM LASER/STENT PLACEMENT Right 09/20/2019   Procedure: CYSTOSCOPY/URETEROSCOPY/HOLMIUM LASER/STENT PLACEMENT;  Surgeon: Carolee Sherwood BIRCH DOUGLAS, MD;  Location: [facility];  Service: Urology;  Laterality: Right;   FRACTURE SURGERY     IR BONE MARROW BIOPSY & ASPIRATION  02/16/2023   IR IMAGING GUIDED PORT INSERTION  02/20/2019   left wrist surgery  2008   By Dr. Lilly   right ankle  1994    SOCIAL HISTORY: Social History   Socioeconomic History   Marital status: Married    Spouse name: Not on file   Number of children: 1   Years of education: Not on file   Highest education level: Not on file  Occupational History    Employer: [employer]  Tobacco Use   Smoking status: Never   Smokeless tobacco: Never   Tobacco comments:    Lives with partner Charlann Irving) and son  Vaping Use   Vaping status: Never Used  Substance and Sexual Activity   Alcohol use: No    Alcohol/week: 0.0 standard drinks of alcohol   Drug use: No   Sexual activity: Never    Partners: Female    Birth control/protection: Post-menopausal    Comment: Lives with female partner (annette hicks) and 63 yo son  Other Topics Concern   Not on file  Social History Narrative   Not on file   Social Drivers of Health   Financial Resource Strain: Low Risk  (09/09/2023)   Overall Financial Resource Strain (CARDIA)    Difficulty of Paying Living Expenses: Not  hard at all  Food Insecurity: No Food Insecurity (09/09/2023)   Hunger Vital Sign    Worried About Running Out of Food in the Last Year: Never true    Ran Out of Food in the Last Year: Never true  Transportation Needs: No Transportation Needs (09/09/2023)   PRAPARE - Administrator, Civil Service (Medical): No    Lack of Transportation (Non-Medical): No  Physical Activity: Inactive (09/09/2023)   Exercise Vital Sign    Days of Exercise per Week: 0 days     Minutes of Exercise per Session: 0 min  Stress: No Stress Concern Present (09/09/2023)   [employer] of Occupational Health - Occupational Stress Questionnaire    Feeling of Stress : Not at all  Social Connections: Socially Integrated (09/09/2023)   Social Connection and Isolation Panel    Frequency of Communication with Friends and Family: More than three times a week    Frequency of Social Gatherings with Friends and Family: More than three times a week    Attends Religious Services: More than 4 times per year    Active Member of [employer] or Organizations: Yes    Attends Engineer, structural: More than 4 times per year    Marital Status: Married  Catering manager Violence: Not At Risk (05/08/2021)   Humiliation, Afraid, Rape, and Kick questionnaire    Fear of Current or Ex-Partner: No    Emotionally Abused: No    Physically Abused: No    Sexually Abused: No    FAMILY HISTORY: Family History  Problem Relation Age of Onset   Diabetes Father    Hyperlipidemia Father    Heart disease Father    Cancer Father    Hypertension Father    Colon cancer Paternal Grandmother 37   Osteoporosis Mother    Protein S deficiency Mother    Hyperlipidemia Mother    Multiple sclerosis Daughter    Cancer Other        bladder   Breast cancer Neg Hx     ALLERGIES:  is allergic to levofloxacin , penicillins, aleve [naproxen sodium], and sulfonamide derivatives.  MEDICATIONS:  Current Outpatient Medications  Medication Sig Dispense Refill   acyclovir  (ZOVIRAX ) 400 MG tablet TAKE 1 TABLET(400 MG) BY MOUTH TWICE DAILY 60 tablet 5   Blood Glucose Monitoring Suppl (FREESTYLE FREEDOM LITE) W/DEVICE KIT Use to check blood sugars twice a day Dx 250.00 1 each 0   Calcium  Carbonate-Vitamin D  600-400 MG-UNIT tablet Take 1 tablet by mouth 2 (two) times daily.     dexamethasone  (DECADRON ) 4 MG tablet Take 5 tablets (20 mg total) by mouth once a week. On D22 of each cycle of treatment 20 tablet 5    ELIQUIS  2.5 MG TABS tablet TAKE 1 TABLET(2.5 MG) BY MOUTH TWICE DAILY 60 tablet 2   fluticasone  (FLONASE ) 50 MCG/ACT nasal spray Place 1 spray into both nostrils daily. 48 g 3   glucose blood (FREESTYLE LITE) test strip CHECK BLOOD SUGAR TWICE DAILY AS DIRECTED Dx 250.00 180 each 3   Lancets (FREESTYLE) lancets Use twice daily to check sugars. 100 each 11   lenalidomide  (REVLIMID ) 5 MG capsule TAKE 1 CAPSULE BY MOUTH DAILY  FOR 21 DAYS, THEN 7 DAYS OFF 21 capsule 0   meclizine  (ANTIVERT ) 25 MG tablet Take 1 tablet (25 mg total) by mouth every 8 (eight) hours as needed for dizziness. 20 tablet 0   metFORMIN  (GLUCOPHAGE -XR) 500 MG 24 hr tablet Take 1 tablet (500 mg  total) by mouth 2 (two) times daily with a meal. 180 tablet 3   metroNIDAZOLE  (METROGEL ) 1 % gel Apply topically in the morning and at bedtime. 60 g 1   Multiple Vitamins-Minerals (ICAPS) CAPS Take 1 capsule by mouth daily after breakfast.     ondansetron  (ZOFRAN ) 8 MG tablet TAKE 1 TABLET(8 MG) BY MOUTH TWICE DAILY AS NEEDED FOR NAUSEA OR VOMITING 30 tablet 1   Oxycodone  HCl 10 MG TABS Take 1 tablet (10 mg total) by mouth every 6 (six) hours as needed. 90 tablet 0   pantoprazole  (PROTONIX ) 20 MG tablet TAKE 1 TABLET(20 MG) BY MOUTH DAILY 30 tablet 2   polyethylene glycol (MIRALAX / GLYCOLAX) packet Take 17 g by mouth daily after breakfast.     potassium chloride  SA (KLOR-CON  M) 20 MEQ tablet TAKE 2 TABLETS(40 MEQ) BY MOUTH TWICE DAILY 360 tablet 1   senna-docusate (SENNA S) 8.6-50 MG tablet Take 2 tablets by mouth at bedtime. 60 tablet 2   sertraline  (ZOLOFT ) 100 MG tablet Take 1 tablet (100 mg total) by mouth daily. 90 tablet 3   simvastatin  (ZOCOR ) 20 MG tablet Take 1 tablet (20 mg total) by mouth daily at 6 PM. 90 tablet 3   Vitamin D , Ergocalciferol , (DRISDOL ) 1.25 MG (50000 UNIT) CAPS capsule TAKE 1 CAPSULE BY MOUTH EVERY 7 DAYS 12 capsule 0   fentaNYL  (DURAGESIC ) 12 MCG/HR Place 1 patch onto the skin every 3 (three) days. (Patient  not taking: Reported on 06/02/2024) 15 patch 0   gabapentin  (NEURONTIN ) 100 MG capsule Take 1 capsule (100 mg total) by mouth 2 (two) times daily. (Patient not taking: Reported on 04/19/2024) 60 capsule 0   lidocaine  (LIDODERM ) 5 % Place 1 patch onto the skin daily. Remove & Discard patch within 12 hours or as directed by MD 30 patch 0   lidocaine -prilocaine  (EMLA ) cream APPLY 1 APPLICATION TO THE AFFECTED AREA AS NEEDED. USE PRIOR TO PORT ACCESS (Patient not taking: Reported on 06/02/2024) 30 g 0   prochlorperazine  (COMPAZINE ) 10 MG tablet Take 1 tablet (10 mg total) by mouth every 6 (six) hours as needed (Nausea or vomiting). (Patient not taking: Reported on 06/02/2024) 30 tablet 1   tizanidine  (ZANAFLEX ) 2 MG capsule Take 1 capsule (2 mg total) by mouth in the morning and at bedtime. (Patient not taking: Reported on 06/02/2024) 20 capsule 0   No current facility-administered medications for this visit.   Facility-Administered Medications Ordered in Other Visits  Medication Dose Route Frequency Provider Last Rate Last Admin   heparin  lock flush 100 unit/mL  500 Units Intracatheter Once PRN Kale, Gautam Kishore, MD       sodium chloride  flush (NS) 0.9 % injection 10 mL  10 mL Intracatheter PRN Kale, Gautam Kishore, MD   10 mL at 12/17/23 1336    REVIEW OF SYSTEMS:    10 Point review of Systems was done is negative except as noted above.   PHYSICAL EXAMINATION:  ECOG FS:1 - Symptomatic but completely ambulatory  .BP (!) 137/57   Pulse (!) 58   Temp (!) 97.5 F (36.4 C)   Resp 18   Wt 153 lb 4.8 oz (69.5 kg)   LMP 10/09/2012   SpO2 95%   BMI 30.96 kg/m    Wt Readings from Last 3 Encounters:  06/02/24 153 lb 4.8 oz (69.5 kg)  05/19/24 152 lb 8 oz (69.2 kg)  05/05/24 154 lb 4.8 oz (70 kg)   Body mass index is 30.96 kg/m.  GENERAL:alert, in no acute distress and comfortable SKIN: no acute rashes, no significant lesions EYES: conjunctiva are pink and non-injected, sclera  anicteric LUNGS: clear to auscultation b/l with normal respiratory effort HEART: regular rate & rhythm Extremity: no pedal edema PSYCH: alert & oriented x 3 with fluent speech NEURO: no focal motor/sensory deficits    LABS     Latest Ref Rng & Units 06/02/2024   12:22 PM 05/19/2024    1:43 PM 05/05/2024   11:38 AM  CBC  WBC 4.0 - 10.5 K/uL 4.6  4.5  4.6   Hemoglobin 12.0 - 15.0 g/dL 86.6  86.6  87.1   Hematocrit 36.0 - 46.0 % 38.8  38.7  38.0   Platelets 150 - 400 K/uL 180  144  170       Latest Ref Rng & Units 06/02/2024   12:22 PM 05/19/2024    1:43 PM 05/05/2024   11:38 AM  CMP  Glucose 70 - 99 mg/dL 778  766  739   BUN 8 - 23 mg/dL 22  16  16    Creatinine 0.44 - 1.00 mg/dL 9.06  9.12  9.15   Sodium 135 - 145 mmol/L 137  138  140   Potassium 3.5 - 5.1 mmol/L 4.0  4.2  4.0   Chloride 98 - 111 mmol/L 108  106  110   CO2 22 - 32 mmol/L 22  25  22    Calcium  8.9 - 10.3 mg/dL 9.8  9.8  9.9   Total Protein 6.5 - 8.1 g/dL 6.2  6.1  6.0   Total Bilirubin 0.0 - 1.2 mg/dL 0.5  0.6  0.4   Alkaline Phos 38 - 126 U/L 79  89  86   AST 15 - 41 U/L 12  13  15    ALT 0 - 44 U/L 12  16  17       09/18/2019 BM Bx Report ([id number])   09/18/2019 FISH Panel    05/30/2019 BM Bx   01/06/2019 BM Bx:     01/06/19 Cytogenetics:      05/30/19 BM Biopsy:   09/18/2019 FISH Panel    09/18/2019 BM Surgical Pathology ([id number])     RADIOGRAPHIC STUDIES: I have personally reviewed the radiological images as listed and agreed with the findings in the report. No results found.   ASSESSMENT & PLAN:  Lynzi Meulemans is a 83 y.o. female who presents for continued management of multiple myeloma.  1.  Nonsecretory multiple Myeloma, RISS Stage III  Labs upon initial presentation from 12/08/18, blood counts are normal including WBC at 7.1k, HGB at 13.1, and PLT at 245k. Calcium  normal at 10.3. Creatinine normal at 0.63. M spike at 0.5g. 12/13/18 Bone Scan revealed  Multifocal uptake throughout the skeleton, consistent with diffuse metastatic disease. Primary tumor is not specified. 2. Uptake in the proximal right femur, consistent with lytic lesions. 3. Uptake in the ribs bilaterally as described. 4. Lesions in the proximal left humerus. 5. Diffuse uptake throughout the skull consistent with metastatic disease. 6. Right paramedian uptake at the manubrium.  12/13/18 CT Right Femur revealed Numerous lytic lesions involving the right femur and a lytic lesion in the left inferior pubic ramus. Overall appearance is most concerning for multiple myeloma  12/27/18 Pretreatment 24hour UPEP observed an M spike at 18mg , and showed 199mg  total protein/day.  12/27/18 Pretreatment MMP revealed M Protein at 0.5g with IgG Lambda specificity. Kappa:Lambda light chain ratio at 0.13, with Lambda at 40.3. There is less  abnormal protein and light chains than I would expect from 30% plasma cells, which suggests hypo-secretory or non-secretory neoplastic plasma cells. Will have an impact in assessing response. 01/05/19 PET/CT revealed Innumerable lytic lesions in the skeleton compatible with myeloma. Most of the larger lesions are hypermetabolic, for example including a left proximal humeral shaft lesion with maximum SUV of 8.1 and a 2.8 cm lesion in the left T9 vertebral body with maximum SUV 5.1. Most of the smaller lytic lesions, and some of the larger lesions, do not demonstrate accentuated metabolic activity. 2. 1.2 cm in short axis lymph node in the left parapharyngeal space is hypermetabolic with maximum SUV 11.8. I do not see a separate mass in the head and neck to give rise to this hypermetabolic lymph node. 3. Mosaic attenuation in the lower lobes, nonspecific possibly from air trapping. 4.  Aortic Atherosclerosis 5. Heterogeneous activity in the liver, making it hard to exclude small liver lesions. Consider hepatic protocol MRI with and without contrast for definitive assessment.  Nonobstructive right nephrolithiasis. Old granulomatous disease  01/06/19 Bone Marrow biopsy revealed interstitial increase in plasma cells (28% aspirate, 40% CD138 immunohistochemistry). Plasma cells negative for light chains consistent with a non or weakly secretory myeloma   01/06/19 Cytogenetics revealed 37% of cells with trisomy 11 or 11q deletion, and 40.5% of cells with 17p mutation  S/p 5 cycles of KRD treatment  05/31/19 BM Biopsy revealed mild atypical plasmacytosis at 5% with polytypic variation.   06/01/19 PET/CT revealed Dominant lesion in the LEFT humerus is decreased significantly in metabolic activity. Additional hypermetabolic skeletal lytic lesions have decreased in metabolic activity or similar to comparison exam (01/05/2019). No evidence of disease progression. 2. Multiple additional lytic lesions do not have metabolic activity and unchanged. 3. No new skeletal lesions are identified. No soft tissue plasmacytoma identified. 4. Nodule / node in the LEFT parapharyngeal space which is intensely hypermetabolic not changed from prior. 5. New hypermetabolic LEFT lower lobe pulmonary nodule is indeterminate. Recommend close attention on follow-up 6. New obstructive hydronephrosis of the RIGHT kidney related to RIGHT UPJ stone.  09/18/2019 BM Bx Report which revealed Slightly hypercellular bone marrow for age with trilineage hematopoiesis and 1% plasma cells.  09/14/2019 PET/CT Whole Body Scan ([id number]) which revealed 1. There widespread tiny lytic lesions compatible with multiple myeloma. Index larger lesions are generally similar to the prior exam, with low-grade activity such as the left T9 vertebral body lesion with maximum SUV 4.5. Is mild increase in the activity associated with a mildly sclerotic left proximal humeral lesion, maximum SUV 4.8 (previously 3.5). 2. At the site of the prior left lower lobe nodule is currently more bandlike thickening, with maximum SUV only 1.9,  probably benign, continued surveillance of this region suggested. 3. There several small but hypermetabolic lymph nodes. This includes a left parapharyngeal space node measuring 1.0 cm with maximum SUV 12.3 (stable); a left level IB lymph node measuring 0.5 cm with maximum SUV 4.8 (slightly larger than prior); and a left inguinal lymph node measuring 0.7 cm in short axis with maximum SUV 6.4 (previously 0.5 cm with maximum SUV 0.6). Significance of these lymph nodes uncertain, surveillance is recommended. 4. New 5 mm left lower lobe subpleural nodule on image 32/8, not appreciably hypermetabolic, surveillance suggested. 5. Focal subcutaneous stranding along the left perineum measuring about 2.6 by 1.1 cm on image 221/4, maximum SUV 12.5. This was not present previously and is most likely inflammatory, although given the notable SUV, surveillance of this  region is suggested. 6. Other imaging findings of potential clinical significance: Aortic Atherosclerosis (ICD10-I70.0). Coronary atherosclerosis. Old granulomatous disease. Mild right hydronephrosis due to a 7 mm right UPJ calculus. 2 mm right kidney upper pole nonobstructive renal calculus. Prominent stool throughout the colon favors constipation.  12/19/2019 Thoracic & Lumbar Spine MRI ([id number]) ([id number]) revealed Suspected myeloma lesions at T9 and S1. No compression deformity or epidural disease.  01/18/2020 PET/CT ([id number]) which revealed 1. Stable lytic lesions throughout the skeleton. The larger lytic lesions which had mild metabolic activity on comparison exam now have background metabolic activity. No evidence of active myeloma. No evidence of progression multiple myeloma.  No plasmacytoma 3. Hypermetabolic nodules in the LEFT neck may be associated deep tissues of the LEFT parotid gland. 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  02/16/2023 Bone Marrow biopsy       2.  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  01/17/19 MRI Liver revealed Several appreciable liver lesions all have benign imaging characteristics. No MRI findings of metastatic involvement of the liver. 2. Scattered bony lesions corresponding to the lytic lesions seen at PET-CT, compatible with active myeloma. 3. Aortic Atherosclerosis.  Mild cardiomegaly. 4. Diffuse hepatic steatosis.   3. Left lower lobe pulmonary nodule First seen on 06/01/19 PET/CT PET/CT 08/27/2020: No hypermetabolic mediastinal or hilar nodes. No suspicious pulmonary nodules on the CT scan.  4. 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 Being managed conservatively as per patient's preference.  No symptoms from this currently.   PLAN: -Due for cycle 17, Day 15 of Carfilzomib  today -Reviewed labs from today were reviewed in detail with patient. WBC 4.6, Hgb 13.3, Plt 180K, Creatinine and LFTs are in range.  -continue maintenance Carfilzomib  and Revlimid  without any dose modifications.   FOLLOW UP: Per integrated scheduling  All of the patient's questions were answered with apparent satisfaction. The patient knows to call the clinic with any problems, questions or concerns.  I have spent a total of 30 minutes minutes of face-to-face and non-face-to-face time, preparing to see the patient, performing a medically appropriate examination, counseling and educating the patient, documenting clinical information in the electronic health record,and care coordination.   Johnston Police PA-C Dept of Hematology and Oncology [facility] Phone: [phone]

## 2024-06-14 ENCOUNTER — Other Ambulatory Visit: Payer: Self-pay

## 2024-06-14 ENCOUNTER — Encounter: Payer: Self-pay | Admitting: Hematology

## 2024-06-14 DIAGNOSIS — C9000 Multiple myeloma not having achieved remission: Secondary | ICD-10-CM

## 2024-06-15 ENCOUNTER — Encounter: Payer: Self-pay | Admitting: Hematology

## 2024-06-15 ENCOUNTER — Inpatient Hospital Stay: Attending: Hematology

## 2024-06-15 ENCOUNTER — Inpatient Hospital Stay

## 2024-06-15 VITALS — BP 120/65 | HR 65 | Temp 97.5°F | Resp 16 | Wt 153.8 lb

## 2024-06-15 DIAGNOSIS — Z5112 Encounter for antineoplastic immunotherapy: Secondary | ICD-10-CM | POA: Insufficient documentation

## 2024-06-15 DIAGNOSIS — C9000 Multiple myeloma not having achieved remission: Secondary | ICD-10-CM | POA: Insufficient documentation

## 2024-06-15 DIAGNOSIS — Z79899 Other long term (current) drug therapy: Secondary | ICD-10-CM | POA: Insufficient documentation

## 2024-06-15 DIAGNOSIS — Z95828 Presence of other vascular implants and grafts: Secondary | ICD-10-CM

## 2024-06-15 DIAGNOSIS — Z7189 Other specified counseling: Secondary | ICD-10-CM

## 2024-06-15 LAB — CMP (CANCER CENTER ONLY)
ALT: 17 U/L (ref 0–44)
AST: 15 U/L (ref 15–41)
Albumin: 3.7 g/dL (ref 3.5–5.0)
Alkaline Phosphatase: 80 U/L (ref 38–126)
Anion gap: 5 (ref 5–15)
BUN: 12 mg/dL (ref 8–23)
CO2: 26 mmol/L (ref 22–32)
Calcium: 9.8 mg/dL (ref 8.9–10.3)
Chloride: 108 mmol/L (ref 98–111)
Creatinine: 0.8 mg/dL (ref 0.44–1.00)
GFR, Estimated: >60 mL/min (ref >60)
Glucose, Bld: 203 mg/dL — ABNORMAL HIGH (ref 70–99)
Potassium: 4 mmol/L (ref 3.5–5.1)
Sodium: 139 mmol/L (ref 135–145)
Total Bilirubin: 0.6 mg/dL (ref 0.0–1.2)
Total Protein: 5.9 g/dL — ABNORMAL LOW (ref 6.5–8.1)

## 2024-06-15 LAB — CBC WITH DIFFERENTIAL (CANCER CENTER ONLY)
Abs Immature Granulocytes: 0.03 10*3/uL (ref 0.00–0.07)
Basophils Absolute: 0 10*3/uL (ref 0.0–0.1)
Basophils Relative: 1 %
Eosinophils Absolute: 0.1 10*3/uL (ref 0.0–0.5)
Eosinophils Relative: 2 %
HCT: 38 % (ref 36.0–46.0)
Hemoglobin: 13.1 g/dL (ref 12.0–15.0)
Immature Granulocytes: 1 %
Lymphocytes Relative: 18 %
Lymphs Abs: 1 10*3/uL (ref 0.7–4.0)
MCH: 33.2 pg (ref 26.0–34.0)
MCHC: 34.5 g/dL (ref 30.0–36.0)
MCV: 96.4 fL (ref 80.0–100.0)
Monocytes Absolute: 1.4 10*3/uL — ABNORMAL HIGH (ref 0.1–1.0)
Monocytes Relative: 24 %
Neutro Abs: 3.1 10*3/uL (ref 1.7–7.7)
Neutrophils Relative %: 54 %
Platelet Count: 171 10*3/uL (ref 150–400)
RBC: 3.94 MIL/uL (ref 3.87–5.11)
RDW: 14.2 % (ref 11.5–15.5)
WBC Count: 5.7 10*3/uL (ref 4.0–10.5)
nRBC: 0 % (ref 0.0–0.2)

## 2024-06-15 MED ORDER — SODIUM CHLORIDE 0.9 % IV SOLN: Freq: Once | INTRAVENOUS | Status: AC

## 2024-06-15 MED ORDER — SODIUM CHLORIDE 0.9% FLUSH
10.0000 mL | Freq: Once | INTRAVENOUS | Status: AC
  Administered 2024-06-15: 10 mL

## 2024-06-15 MED ORDER — ACETAMINOPHEN 325 MG PO TABS
650.0000 mg | ORAL_TABLET | Freq: Once | ORAL | Status: AC
  Administered 2024-06-15: 650 mg via ORAL
  Filled 2024-06-15: qty 2

## 2024-06-15 MED ORDER — DEXAMETHASONE 4 MG PO TABS
8.0000 mg | ORAL_TABLET | Freq: Once | ORAL | Status: AC
  Administered 2024-06-15: 8 mg via ORAL
  Filled 2024-06-15: qty 2

## 2024-06-15 MED ORDER — DIPHENHYDRAMINE HCL 25 MG PO CAPS
25.0000 mg | ORAL_CAPSULE | Freq: Once | ORAL | Status: AC
Start: 2024-06-15 — End: 2024-06-15
  Administered 2024-06-15: 25 mg via ORAL
  Filled 2024-06-15: qty 1

## 2024-06-15 MED ORDER — DEXTROSE 5 % IV SOLN
36.0000 mg/m2 | Freq: Once | INTRAVENOUS | Status: AC
  Administered 2024-06-15: 60 mg via INTRAVENOUS
  Filled 2024-06-15: qty 30

## 2024-06-15 MED ORDER — PROCHLORPERAZINE MALEATE 10 MG PO TABS
10.0000 mg | ORAL_TABLET | Freq: Once | ORAL | Status: AC
  Administered 2024-06-15: 10 mg via ORAL
  Filled 2024-06-15: qty 1

## 2024-06-15 MED ORDER — OXYCODONE HCL 10 MG PO TABS: 10.0000 mg | ORAL_TABLET | Freq: Four times a day (QID) | ORAL | 0 refills | Status: DC | PRN

## 2024-06-15 MED ORDER — FAMOTIDINE IN NACL 20-0.9 MG/50ML-% IV SOLN
20.0000 mg | Freq: Once | INTRAVENOUS | Status: AC
  Administered 2024-06-15: 20 mg via INTRAVENOUS
  Filled 2024-06-15: qty 50

## 2024-06-15 NOTE — Patient Instructions (Signed)
 [facility] - A DEPT OF [facility]  Discharge Instructions: Thank you for choosing [facility] to provide your oncology and hematology care.   If you have a lab appointment with the Cancer Center, please go directly to the Cancer Center and check in at the registration area.   Wear comfortable clothing and clothing appropriate for easy access to any Portacath or PICC line.   We strive to give you quality time with your provider. You may need to reschedule your appointment if you arrive late (15 or more minutes).  Arriving late affects you and other patients whose appointments are after yours.  Also, if you miss three or more appointments without notifying the office, you may be dismissed from the clinic at the provider's discretion.      For prescription refill requests, have your pharmacy contact our office and allow 72 hours for refills to be completed.    Today you received the following chemotherapy and/or immunotherapy agent: Carfilzomib (Kyprolis)      To help prevent nausea and vomiting after your treatment, we encourage you to take your nausea medication as directed.  BELOW ARE SYMPTOMS THAT SHOULD BE REPORTED IMMEDIATELY: *FEVER GREATER THAN 100.4 F (38 C) OR HIGHER *CHILLS OR SWEATING *NAUSEA AND VOMITING THAT IS NOT CONTROLLED WITH YOUR NAUSEA MEDICATION *UNUSUAL SHORTNESS OF BREATH *UNUSUAL BRUISING OR BLEEDING *URINARY PROBLEMS (pain or burning when urinating, or frequent urination) *BOWEL PROBLEMS (unusual diarrhea, constipation, pain near the anus) TENDERNESS IN MOUTH AND THROAT WITH OR WITHOUT PRESENCE OF ULCERS (sore throat, sores in mouth, or a toothache) UNUSUAL RASH, SWELLING OR PAIN  UNUSUAL VAGINAL DISCHARGE OR ITCHING   Items with * indicate a potential emergency and should be followed up as soon as possible or go to the Emergency Department if any problems should occur.  Please show the CHEMOTHERAPY ALERT CARD or  IMMUNOTHERAPY ALERT CARD at check-in to the Emergency Department and triage nurse.  Should you have questions after your visit or need to cancel or reschedule your appointment, please contact [facility]  Dept: [phone]  and follow the prompts.  Office hours are 8:00 a.m. to 4:30 p.m. Monday - Friday. Please note that voicemails left after 4:00 p.m. may not be returned until the following business day.  We are closed weekends and major holidays. You have access to a nurse at all times for urgent questions. Please call the main number to the clinic Dept: [phone] and follow the prompts.   For any non-urgent questions, you may also contact your provider using MyChart. We now offer e-Visits for anyone 81 and older to request care online for non-urgent symptoms. For details visit [email or web address].   Also download the MyChart app! Go to the app store, search "MyChart", open the app, select [place], and log in with your MyChart username and password.

## 2024-06-18 ENCOUNTER — Other Ambulatory Visit: Payer: Self-pay | Admitting: Hematology

## 2024-06-18 DIAGNOSIS — C9000 Multiple myeloma not having achieved remission: Secondary | ICD-10-CM

## 2024-06-20 ENCOUNTER — Encounter: Payer: Self-pay | Admitting: Hematology

## 2024-06-29 NOTE — Progress Notes (Signed)
 HEMATOLOGY/ONCOLOGY CLINIC NOTE  Date of Service: 06/30/2024   Patient Care Team: Rollene Almarie LABOR, MD as PCP - General (Internal Medicine) Obie Princella HERO, MD (Inactive) as Consulting Physician (Gastroenterology) Cleotilde Ronal RAMAN, MD as Consulting Physician (Gynecology) Sheril Coy, MD as Consulting Physician (Orthopedic Surgery) Neysa Reggy BIRCH, MD as Consulting Physician (Pulmonary Disease) Cleatus Collar, MD (Ophthalmology)  CHIEF COMPLAINTS/PURPOSE OF VISIT:  Follow-up for continued evaluation and management of multiple myeloma  HISTORY OF PRESENTING ILLNESS:  Please see previous notes for details on initial presentation  Current Treatment: Carfilzomib  + Revlimid  maintenance.  INTERVAL HISTORY:   Faith Orr is a 83 y.o. female who is here for continued evaluation and management of the patient's multiple myeloma. She is here to start cycle 18 day 15 of her treatment.   Patient was last seen by me on 05/05/2024 and complained of generalized pain with joint pain in the shoulders and head pain. She also reported mild fatigue and lower back discomfort on palpation.   She was most recently seen by PA Thayil on 06/02/2024 and reported occasional nausea and intermittent diarrhea.   Patient presents with a walker. She reports that she has been doing well overall since her last clinical visit.   She reports that she continues to be on oxycodone . Patient takes one tablet at bedtime and one in mornings. She notes that her pain is not manageable when she is not taking Oxycodone . She add that she additionally takes Tylenol  in between. Patient notes that her back pain has significantly improved from previously.   She is tolerating treatment well with no new or major toxicities.   Patient denies any other medication changes.   She denies any infection issues, new bone pain, fever, chills, or night sweats. Patient denies any pain or swelling at the port.   She reports  that her fatigue is manageable.   Patient notes abdominal pain only with bowel movements. She is taking 1-2 tablets of Senna as needed for constipation.   MEDICAL HISTORY:  Past Medical History:  Diagnosis Date   Allergy    seasonal   Asthma    DEPRESSION    DIABETES MELLITUS, TYPE II    Diverticulosis    HYPERLIPIDEMIA    Macular degeneration of left eye    mild, Dr.Hecker   Obesity, unspecified    Osteoarthritis of both knees    OSTEOPENIA    Osteopenia    URINARY INCONTINENCE     SURGICAL HISTORY: Past Surgical History:  Procedure Laterality Date   CATARACT EXTRACTION Left 05/24/2018   CESAREAN SECTION  01/1973   CYSTOSCOPY/URETEROSCOPY/HOLMIUM LASER/STENT PLACEMENT Right 09/20/2019   Procedure: CYSTOSCOPY/URETEROSCOPY/HOLMIUM LASER/STENT PLACEMENT;  Surgeon: Carolee Sherwood BIRCH DOUGLAS, MD;  Location: [facility];  Service: Urology;  Laterality: Right;   FRACTURE SURGERY     IR BONE MARROW BIOPSY & ASPIRATION  02/16/2023   IR IMAGING GUIDED PORT INSERTION  02/20/2019   left wrist surgery  2008   By Dr. Lilly   right ankle  1994    SOCIAL HISTORY: Social History   Socioeconomic History   Marital status: Married    Spouse name: Not on file   Number of children: 1   Years of education: Not on file   Highest education level: Not on file  Occupational History    Employer: [employer]  Tobacco Use   Smoking status: Never   Smokeless tobacco: Never   Tobacco comments:    Lives with partner (  Lenward Irving) and son  Vaping Use   Vaping status: Never Used  Substance and Sexual Activity   Alcohol use: No    Alcohol/week: 0.0 standard drinks of alcohol   Drug use: No   Sexual activity: Never    Partners: Female    Birth control/protection: Post-menopausal    Comment: Lives with female partner (annette hicks) and 18 yo son  Other Topics Concern   Not on file  Social History Narrative   Not on file   Social Drivers of Health   Financial  Resource Strain: Low Risk  (09/09/2023)   Overall Financial Resource Strain (CARDIA)    Difficulty of Paying Living Expenses: Not hard at all  Food Insecurity: No Food Insecurity (09/09/2023)   Hunger Vital Sign    Worried About Running Out of Food in the Last Year: Never true    Ran Out of Food in the Last Year: Never true  Transportation Needs: No Transportation Needs (09/09/2023)   PRAPARE - Administrator, Civil Service (Medical): No    Lack of Transportation (Non-Medical): No  Physical Activity: Inactive (09/09/2023)   Exercise Vital Sign    Days of Exercise per Week: 0 days    Minutes of Exercise per Session: 0 min  Stress: No Stress Concern Present (09/09/2023)   [employer] of Occupational Health - Occupational Stress Questionnaire    Feeling of Stress : Not at all  Social Connections: Socially Integrated (09/09/2023)   Social Connection and Isolation Panel    Frequency of Communication with Friends and Family: More than three times a week    Frequency of Social Gatherings with Friends and Family: More than three times a week    Attends Religious Services: More than 4 times per year    Active Member of [employer] or Organizations: Yes    Attends Engineer, structural: More than 4 times per year    Marital Status: Married  Catering manager Violence: Not At Risk (05/08/2021)   Humiliation, Afraid, Rape, and Kick questionnaire    Fear of Current or Ex-Partner: No    Emotionally Abused: No    Physically Abused: No    Sexually Abused: No    FAMILY HISTORY: Family History  Problem Relation Age of Onset   Diabetes Father    Hyperlipidemia Father    Heart disease Father    Cancer Father    Hypertension Father    Colon cancer Paternal Grandmother 18   Osteoporosis Mother    Protein S deficiency Mother    Hyperlipidemia Mother    Multiple sclerosis Daughter    Cancer Other        bladder   Breast cancer Neg Hx     ALLERGIES:  is allergic to levofloxacin ,  penicillins, aleve [naproxen sodium], and sulfonamide derivatives.  MEDICATIONS:  Current Outpatient Medications  Medication Sig Dispense Refill   acyclovir  (ZOVIRAX ) 400 MG tablet TAKE 1 TABLET(400 MG) BY MOUTH TWICE DAILY 60 tablet 5   Blood Glucose Monitoring Suppl (FREESTYLE FREEDOM LITE) W/DEVICE KIT Use to check blood sugars twice a day Dx 250.00 1 each 0   Calcium  Carbonate-Vitamin D  600-400 MG-UNIT tablet Take 1 tablet by mouth 2 (two) times daily.     dexamethasone  (DECADRON ) 4 MG tablet Take 5 tablets (20 mg total) by mouth once a week. On D22 of each cycle of treatment 20 tablet 5   ELIQUIS  2.5 MG TABS tablet TAKE 1 TABLET(2.5 MG) BY MOUTH TWICE DAILY 60 tablet 2  fentaNYL  (DURAGESIC ) 12 MCG/HR Place 1 patch onto the skin every 3 (three) days. (Patient not taking: Reported on 06/02/2024) 15 patch 0   fluticasone  (FLONASE ) 50 MCG/ACT nasal spray Place 1 spray into both nostrils daily. 48 g 3   gabapentin  (NEURONTIN ) 100 MG capsule Take 1 capsule (100 mg total) by mouth 2 (two) times daily. (Patient not taking: Reported on 04/19/2024) 60 capsule 0   glucose blood (FREESTYLE LITE) test strip CHECK BLOOD SUGAR TWICE DAILY AS DIRECTED Dx 250.00 180 each 3   Lancets (FREESTYLE) lancets Use twice daily to check sugars. 100 each 11   lenalidomide  (REVLIMID ) 5 MG capsule TAKE 1 CAPSULE BY MOUTH DAILY  FOR 21 DAYS, THEN 7 DAYS OFF 21 capsule 0   lidocaine  (LIDODERM ) 5 % Place 1 patch onto the skin daily. Remove & Discard patch within 12 hours or as directed by MD 30 patch 0   lidocaine -prilocaine  (EMLA ) cream APPLY 1 APPLICATION TO THE AFFECTED AREA AS NEEDED. USE PRIOR TO PORT ACCESS (Patient not taking: Reported on 06/02/2024) 30 g 0   meclizine  (ANTIVERT ) 25 MG tablet Take 1 tablet (25 mg total) by mouth every 8 (eight) hours as needed for dizziness. 20 tablet 0   metFORMIN  (GLUCOPHAGE -XR) 500 MG 24 hr tablet Take 1 tablet (500 mg total) by mouth 2 (two) times daily with a meal. 180 tablet 3    metroNIDAZOLE  (METROGEL ) 1 % gel Apply topically in the morning and at bedtime. 60 g 1   Multiple Vitamins-Minerals (ICAPS) CAPS Take 1 capsule by mouth daily after breakfast.     ondansetron  (ZOFRAN ) 8 MG tablet TAKE 1 TABLET(8 MG) BY MOUTH TWICE DAILY AS NEEDED FOR NAUSEA OR VOMITING 30 tablet 1   Oxycodone  HCl 10 MG TABS Take 1 tablet (10 mg total) by mouth every 6 (six) hours as needed. 90 tablet 0   pantoprazole  (PROTONIX ) 20 MG tablet TAKE 1 TABLET(20 MG) BY MOUTH DAILY 30 tablet 2   polyethylene glycol (MIRALAX / GLYCOLAX) packet Take 17 g by mouth daily after breakfast.     potassium chloride  SA (KLOR-CON  M) 20 MEQ tablet TAKE 2 TABLETS(40 MEQ) BY MOUTH TWICE DAILY 360 tablet 1   prochlorperazine  (COMPAZINE ) 10 MG tablet Take 1 tablet (10 mg total) by mouth every 6 (six) hours as needed (Nausea or vomiting). (Patient not taking: Reported on 06/02/2024) 30 tablet 1   senna-docusate (SENNA S) 8.6-50 MG tablet Take 2 tablets by mouth at bedtime. 60 tablet 2   sertraline  (ZOLOFT ) 100 MG tablet Take 1 tablet (100 mg total) by mouth daily. 90 tablet 3   simvastatin  (ZOCOR ) 20 MG tablet Take 1 tablet (20 mg total) by mouth daily at 6 PM. 90 tablet 3   tizanidine  (ZANAFLEX ) 2 MG capsule Take 1 capsule (2 mg total) by mouth in the morning and at bedtime. (Patient not taking: Reported on 06/02/2024) 20 capsule 0   Vitamin D , Ergocalciferol , (DRISDOL ) 1.25 MG (50000 UNIT) CAPS capsule TAKE 1 CAPSULE BY MOUTH EVERY 7 DAYS 12 capsule 0   No current facility-administered medications for this visit.   Facility-Administered Medications Ordered in Other Visits  Medication Dose Route Frequency Provider Last Rate Last Admin   heparin  lock flush 100 unit/mL  500 Units Intracatheter Once PRN Ingram Onnen Kishore, MD       sodium chloride  flush (NS) 0.9 % injection 10 mL  10 mL Intracatheter PRN Jackye Dever Kishore, MD   10 mL at 12/17/23 1336    REVIEW OF  SYSTEMS:    10 Point review of Systems was done is  negative except as noted above.   PHYSICAL EXAMINATION: ECOG FS:2 - Symptomatic, <50% confined to bed  .BP 120/60   Pulse 75   Temp 97.7 F (36.5 C)   Resp 18   Wt 153 lb 9.6 oz (69.7 kg)   LMP 10/09/2012   SpO2 95%   BMI 31.02 kg/m  GENERAL:alert, in no acute distress and comfortable SKIN: no acute rashes, no significant lesions EYES: conjunctiva are pink and non-injected, sclera anicteric OROPHARYNX: MMM, no exudates, no oropharyngeal erythema or ulceration NECK: supple, no JVD LYMPH:  no palpable lymphadenopathy in the cervical, axillary or inguinal regions LUNGS: clear to auscultation b/l with normal respiratory effort HEART: regular rate & rhythm ABDOMEN:  normoactive bowel sounds , non tender, not distended. Extremity: no pedal edema PSYCH: alert & oriented x 3 with fluent speech NEURO: no focal motor/sensory deficits   LABS .    Latest Ref Rng & Units 06/30/2024    9:22 AM 06/15/2024    1:48 PM 06/02/2024   12:22 PM  CBC  WBC 4.0 - 10.5 K/uL 4.8  5.7  4.6   Hemoglobin 12.0 - 15.0 g/dL 86.5  86.8  86.6   Hematocrit 36.0 - 46.0 % 39.0  38.0  38.8   Platelets 150 - 400 K/uL 181  171  180    .    Latest Ref Rng & Units 06/30/2024    9:22 AM 06/15/2024    1:48 PM 06/02/2024   12:22 PM  CMP  Glucose 70 - 99 mg/dL 746  796  778   BUN 8 - 23 mg/dL 20  12  22    Creatinine 0.44 - 1.00 mg/dL 9.07  9.19  9.06   Sodium 135 - 145 mmol/L 138  139  137   Potassium 3.5 - 5.1 mmol/L 3.8  4.0  4.0   Chloride 98 - 111 mmol/L 106  108  108   CO2 22 - 32 mmol/L 24  26  22    Calcium  8.9 - 10.3 mg/dL 89.9  9.8  9.8   Total Protein 6.5 - 8.1 g/dL 6.2  5.9  6.2   Total Bilirubin 0.0 - 1.2 mg/dL 0.6  0.6  0.5   Alkaline Phos 38 - 126 U/L 87  80  79   AST 15 - 41 U/L 15  15  12    ALT 0 - 44 U/L 15  17  12       09/18/2019 BM Bx Report ([id number])   09/18/2019 FISH Panel    05/30/2019 BM Bx   01/06/2019 BM Bx:     01/06/19 Cytogenetics:      05/30/19 BM  Biopsy:   09/18/2019 FISH Panel    09/18/2019 BM Surgical Pathology ([id number])     RADIOGRAPHIC STUDIES: I have personally reviewed the radiological images as listed and agreed with the findings in the report. No results found.    ASSESSMENT & PLAN:   83 y.o. female with  1.  Nonsecretory multiple Myeloma, RISS Stage III  Labs upon initial presentation from 12/08/18, blood counts are normal including WBC at 7.1k, HGB at 13.1, and PLT at 245k. Calcium  normal at 10.3. Creatinine normal at 0.63. M spike at 0.5g. 12/13/18 Bone Scan revealed Multifocal uptake throughout the skeleton, consistent with diffuse metastatic disease. Primary tumor is not specified. 2. Uptake in the proximal right femur, consistent with lytic lesions. 3. Uptake in the ribs bilaterally as described.  4. Lesions in the proximal left humerus. 5. Diffuse uptake throughout the skull consistent with metastatic disease. 6. Right paramedian uptake at the manubrium.  12/13/18 CT Right Femur revealed Numerous lytic lesions involving the right femur and a lytic lesion in the left inferior pubic ramus. Overall appearance is most concerning for multiple myeloma  12/27/18 Pretreatment 24hour UPEP observed an M spike at 18mg , and showed 199mg  total protein/day.  12/27/18 Pretreatment MMP revealed M Protein at 0.5g with IgG Lambda specificity. Kappa:Lambda light chain ratio at 0.13, with Lambda at 40.3. There is less abnormal protein and light chains than I would expect from 30% plasma cells, which suggests hypo-secretory or non-secretory neoplastic plasma cells. Will have an impact in assessing response. 01/05/19 PET/CT revealed Innumerable lytic lesions in the skeleton compatible with myeloma. Most of the larger lesions are hypermetabolic, for example including a left proximal humeral shaft lesion with maximum SUV of 8.1 and a 2.8 cm lesion in the left T9 vertebral body with maximum SUV 5.1. Most of the smaller lytic lesions,  and some of the larger lesions, do not demonstrate accentuated metabolic activity. 2. 1.2 cm in short axis lymph node in the left parapharyngeal space is hypermetabolic with maximum SUV 11.8. I do not see a separate mass in the head and neck to give rise to this hypermetabolic lymph node. 3. Mosaic attenuation in the lower lobes, nonspecific possibly from air trapping. 4.  Aortic Atherosclerosis 5. Heterogeneous activity in the liver, making it hard to exclude small liver lesions. Consider hepatic protocol MRI with and without contrast for definitive assessment. Nonobstructive right nephrolithiasis. Old granulomatous disease  01/06/19 Bone Marrow biopsy revealed interstitial increase in plasma cells (28% aspirate, 40% CD138 immunohistochemistry). Plasma cells negative for light chains consistent with a non or weakly secretory myeloma   01/06/19 Cytogenetics revealed 37% of cells with trisomy 11 or 11q deletion, and 40.5% of cells with 17p mutation  S/p 5 cycles of KRD treatment  05/31/19 BM Biopsy revealed mild atypical plasmacytosis at 5% with polytypic variation.   06/01/19 PET/CT revealed Dominant lesion in the LEFT humerus is decreased significantly in metabolic activity. Additional hypermetabolic skeletal lytic lesions have decreased in metabolic activity or similar to comparison exam (01/05/2019). No evidence of disease progression. 2. Multiple additional lytic lesions do not have metabolic activity and unchanged. 3. No new skeletal lesions are identified. No soft tissue plasmacytoma identified. 4. Nodule / node in the LEFT parapharyngeal space which is intensely hypermetabolic not changed from prior. 5. New hypermetabolic LEFT lower lobe pulmonary nodule is indeterminate. Recommend close attention on follow-up 6. New obstructive hydronephrosis of the RIGHT kidney related to RIGHT UPJ stone.  09/18/2019 BM Bx Report which revealed Slightly hypercellular bone marrow for age with trilineage  hematopoiesis and 1% plasma cells.  09/14/2019 PET/CT Whole Body Scan ([id number]) which revealed 1. There widespread tiny lytic lesions compatible with multiple myeloma. Index larger lesions are generally similar to the prior exam, with low-grade activity such as the left T9 vertebral body lesion with maximum SUV 4.5. Is mild increase in the activity associated with a mildly sclerotic left proximal humeral lesion, maximum SUV 4.8 (previously 3.5). 2. At the site of the prior left lower lobe nodule is currently more bandlike thickening, with maximum SUV only 1.9, probably benign, continued surveillance of this region suggested. 3. There several small but hypermetabolic lymph nodes. This includes a left parapharyngeal space node measuring 1.0 cm with maximum SUV 12.3 (stable); a left level IB lymph node measuring  0.5 cm with maximum SUV 4.8 (slightly larger than prior); and a left inguinal lymph node measuring 0.7 cm in short axis with maximum SUV 6.4 (previously 0.5 cm with maximum SUV 0.6). Significance of these lymph nodes uncertain, surveillance is recommended. 4. New 5 mm left lower lobe subpleural nodule on image 32/8, not appreciably hypermetabolic, surveillance suggested. 5. Focal subcutaneous stranding along the left perineum measuring about 2.6 by 1.1 cm on image 221/4, maximum SUV 12.5. This was not present previously and is most likely inflammatory, although given the notable SUV, surveillance of this region is suggested. 6. Other imaging findings of potential clinical significance: Aortic Atherosclerosis (ICD10-I70.0). Coronary atherosclerosis. Old granulomatous disease. Mild right hydronephrosis due to a 7 mm right UPJ calculus. 2 mm right kidney upper pole nonobstructive renal calculus. Prominent stool throughout the colon favors constipation.  12/19/2019 Thoracic & Lumbar Spine MRI ([id number]) ([id number]) revealed Suspected myeloma lesions at T9 and S1. No compression deformity or epidural  disease.  01/18/2020 PET/CT ([id number]) which revealed 1. Stable lytic lesions throughout the skeleton. The larger lytic lesions which had mild metabolic activity on comparison exam now have background metabolic activity. No evidence of active myeloma. No evidence of progression multiple myeloma.  No plasmacytoma 3. Hypermetabolic nodules in the LEFT neck may be associated deep tissues of the LEFT parotid gland. 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  02/16/2023 Bone Marrow biopsy       2. 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  01/17/19 MRI Liver revealed Several appreciable liver lesions all have benign imaging characteristics. No MRI findings of metastatic involvement of the liver. 2. Scattered bony lesions corresponding to the lytic lesions seen at PET-CT, compatible with active myeloma. 3. Aortic Atherosclerosis.  Mild cardiomegaly. 4. Diffuse hepatic steatosis.   3. Left lower lobe pulmonary nodule First seen on 06/01/19 PET/CT PET/CT 08/27/2020: No hypermetabolic mediastinal or hilar nodes. No suspicious pulmonary nodules on the CT scan.  4. 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 Being managed conservatively as per patient's preference.  No symptoms from this currently.  5. Mild chronic kidney disease CMP done 04/09/2022 showed creatinine of 1.17 and GFR est of 48 consistent with mild chronic kidney disease.  PLAN:  -Discussed lab results on 06/30/2024 in detail with patient. CBC stable, showed WBC of 4.8K, hemoglobin of 13.4, and platelets of 181K. -CMP stable -did not feel any enlarged lymph nodes during physical examination -patient denies any new or severe toxicities with her maintenance Carfilzomib  + Revlimid  treatment and she shall  continue this treatment.  -patient is not noted to have any new focalized new pain outside of her previously known painful areas.  -okay to continue Oxycodone  for back pain management -we discussed that oxycodone  can be constipating -continue Senna as needed for constipation -re-discussed that non-secretory myeloma cannot be monitored based on protein levels, and there is therefore a need for continued monitoring with PET scans every 6 months.  -discussed that we will continue to plan for a PET roughly every 6 months unless she has any new concerns beforehand.  -she shall return to clinic in 2 months  FOLLOW UP: Plz schedule next 4 cycles of treatment MD visit every 8 weeks  The total time spent in the appointment was 30 minutes* .  All of the patient's questions were answered with  apparent satisfaction. The patient knows to call the clinic with any problems, questions or concerns.   Emaline Saran MD MS AAHIVMS [facility] Hematology/Oncology Physician [facility]  .*Total Encounter Time as defined by the Centers for Medicare and Medicaid Services includes, in addition to the face-to-face time of a patient visit (documented in the note above) non-face-to-face time: obtaining and reviewing outside history, ordering and reviewing medications, tests or procedures, care coordination (communications with other health care professionals or caregivers) and documentation in the medical record.    I,Mitra Faeizi,acting as a Neurosurgeon for Emaline Saran, MD.,have documented all relevant documentation on the behalf of Emaline Saran, MD,as directed by  Emaline Saran, MD while in the presence of Emaline Saran, MD.  .I have reviewed the above documentation for accuracy and completeness, and I agree with the above. .Maryagnes Carrasco Kishore Wyat Infinger MD

## 2024-06-30 ENCOUNTER — Inpatient Hospital Stay

## 2024-06-30 ENCOUNTER — Inpatient Hospital Stay (HOSPITAL_BASED_OUTPATIENT_CLINIC_OR_DEPARTMENT_OTHER): Admitting: Hematology

## 2024-06-30 VITALS — BP 106/57 | HR 56 | Temp 98.1°F | Resp 20

## 2024-06-30 VITALS — BP 120/60 | HR 75 | Temp 97.7°F | Resp 18 | Wt 153.6 lb

## 2024-06-30 DIAGNOSIS — Z5112 Encounter for antineoplastic immunotherapy: Secondary | ICD-10-CM | POA: Diagnosis not present

## 2024-06-30 DIAGNOSIS — C9000 Multiple myeloma not having achieved remission: Secondary | ICD-10-CM

## 2024-06-30 DIAGNOSIS — Z79899 Other long term (current) drug therapy: Secondary | ICD-10-CM | POA: Diagnosis not present

## 2024-06-30 DIAGNOSIS — Z7189 Other specified counseling: Secondary | ICD-10-CM

## 2024-06-30 DIAGNOSIS — Z95828 Presence of other vascular implants and grafts: Secondary | ICD-10-CM

## 2024-06-30 LAB — CBC WITH DIFFERENTIAL (CANCER CENTER ONLY)
Abs Immature Granulocytes: 0.02 K/uL (ref 0.00–0.07)
Basophils Absolute: 0.1 K/uL (ref 0.0–0.1)
Basophils Relative: 1 %
Eosinophils Absolute: 0.1 K/uL (ref 0.0–0.5)
Eosinophils Relative: 1 %
HCT: 39 % (ref 36.0–46.0)
Hemoglobin: 13.4 g/dL (ref 12.0–15.0)
Immature Granulocytes: 0 %
Lymphocytes Relative: 26 %
Lymphs Abs: 1.3 K/uL (ref 0.7–4.0)
MCH: 33.5 pg (ref 26.0–34.0)
MCHC: 34.4 g/dL (ref 30.0–36.0)
MCV: 97.5 fL (ref 80.0–100.0)
Monocytes Absolute: 1 K/uL (ref 0.1–1.0)
Monocytes Relative: 21 %
Neutro Abs: 2.4 K/uL (ref 1.7–7.7)
Neutrophils Relative %: 51 %
Platelet Count: 181 K/uL (ref 150–400)
RBC: 4 MIL/uL (ref 3.87–5.11)
RDW: 14.2 % (ref 11.5–15.5)
WBC Count: 4.8 K/uL (ref 4.0–10.5)
nRBC: 0 % (ref 0.0–0.2)

## 2024-06-30 LAB — CMP (CANCER CENTER ONLY)
ALT: 15 U/L (ref 0–44)
AST: 15 U/L (ref 15–41)
Albumin: 3.9 g/dL (ref 3.5–5.0)
Alkaline Phosphatase: 87 U/L (ref 38–126)
Anion gap: 8 (ref 5–15)
BUN: 20 mg/dL (ref 8–23)
CO2: 24 mmol/L (ref 22–32)
Calcium: 10 mg/dL (ref 8.9–10.3)
Chloride: 106 mmol/L (ref 98–111)
Creatinine: 0.92 mg/dL (ref 0.44–1.00)
GFR, Estimated: >60 mL/min (ref >60)
Glucose, Bld: 253 mg/dL — ABNORMAL HIGH (ref 70–99)
Potassium: 3.8 mmol/L (ref 3.5–5.1)
Sodium: 138 mmol/L (ref 135–145)
Total Bilirubin: 0.6 mg/dL (ref 0.0–1.2)
Total Protein: 6.2 g/dL — ABNORMAL LOW (ref 6.5–8.1)

## 2024-06-30 MED ORDER — DEXAMETHASONE 4 MG PO TABS
8.0000 mg | ORAL_TABLET | Freq: Once | ORAL | Status: AC
  Administered 2024-06-30: 8 mg via ORAL
  Filled 2024-06-30: qty 2

## 2024-06-30 MED ORDER — ZOLEDRONIC ACID 4 MG/100ML IV SOLN
4.0000 mg | Freq: Once | INTRAVENOUS | Status: AC
Start: 2024-06-30 — End: 2024-06-30
  Administered 2024-06-30: 4 mg via INTRAVENOUS
  Filled 2024-06-30: qty 100

## 2024-06-30 MED ORDER — ACETAMINOPHEN 325 MG PO TABS
650.0000 mg | ORAL_TABLET | Freq: Once | ORAL | Status: AC
  Administered 2024-06-30: 650 mg via ORAL
  Filled 2024-06-30: qty 2

## 2024-06-30 MED ORDER — PROCHLORPERAZINE MALEATE 10 MG PO TABS
10.0000 mg | ORAL_TABLET | Freq: Once | ORAL | Status: AC
  Administered 2024-06-30: 10 mg via ORAL
  Filled 2024-06-30: qty 1

## 2024-06-30 MED ORDER — FAMOTIDINE IN NACL 20-0.9 MG/50ML-% IV SOLN
20.0000 mg | Freq: Once | INTRAVENOUS | Status: AC
  Administered 2024-06-30: 20 mg via INTRAVENOUS
  Filled 2024-06-30: qty 50

## 2024-06-30 MED ORDER — SODIUM CHLORIDE 0.9% FLUSH
10.0000 mL | INTRAVENOUS | Status: DC | PRN
  Administered 2024-06-30: 10 mL

## 2024-06-30 MED ORDER — DIPHENHYDRAMINE HCL 25 MG PO CAPS
25.0000 mg | ORAL_CAPSULE | Freq: Once | ORAL | Status: AC
  Administered 2024-06-30: 25 mg via ORAL
  Filled 2024-06-30: qty 1

## 2024-06-30 MED ORDER — DEXTROSE 5 % IV SOLN
36.0000 mg/m2 | Freq: Once | INTRAVENOUS | Status: AC
  Administered 2024-06-30: 60 mg via INTRAVENOUS
  Filled 2024-06-30: qty 30

## 2024-06-30 MED ORDER — HEPARIN SOD (PORK) LOCK FLUSH 100 UNIT/ML IV SOLN
500.0000 [IU] | Freq: Once | INTRAVENOUS | Status: AC | PRN
  Administered 2024-06-30: 500 [IU]

## 2024-06-30 MED ORDER — SODIUM CHLORIDE 0.9 % IV SOLN
Freq: Once | INTRAVENOUS | Status: AC
Start: 2024-06-30 — End: 2024-06-30

## 2024-06-30 MED ORDER — SODIUM CHLORIDE 0.9% FLUSH
10.0000 mL | Freq: Once | INTRAVENOUS | Status: AC
Start: 2024-06-30 — End: 2024-06-30
  Administered 2024-06-30: 10 mL

## 2024-06-30 NOTE — Progress Notes (Signed)
 Pt. states she has several cavities, but not planning on any dental interventions/procedures at this time. Per Dr. Onesimo, ok to proceed with Zometa  today.

## 2024-06-30 NOTE — Patient Instructions (Signed)
 [facility] - A DEPT OF [facility]  Discharge Instructions: Thank you for choosing [facility] to provide your oncology and hematology care.   If you have a lab appointment with the Cancer Center, please go directly to the Cancer Center and check in at the registration area.   Wear comfortable clothing and clothing appropriate for easy access to any Portacath or PICC line.   We strive to give you quality time with your provider. You may need to reschedule your appointment if you arrive late (15 or more minutes).  Arriving late affects you and other patients whose appointments are after yours.  Also, if you miss three or more appointments without notifying the office, you may be dismissed from the clinic at the provider's discretion.      For prescription refill requests, have your pharmacy contact our office and allow 72 hours for refills to be completed.    Today you received the following chemotherapy and/or immunotherapy agent: Carfilzomib (Kyprolis)      To help prevent nausea and vomiting after your treatment, we encourage you to take your nausea medication as directed.  BELOW ARE SYMPTOMS THAT SHOULD BE REPORTED IMMEDIATELY: *FEVER GREATER THAN 100.4 F (38 C) OR HIGHER *CHILLS OR SWEATING *NAUSEA AND VOMITING THAT IS NOT CONTROLLED WITH YOUR NAUSEA MEDICATION *UNUSUAL SHORTNESS OF BREATH *UNUSUAL BRUISING OR BLEEDING *URINARY PROBLEMS (pain or burning when urinating, or frequent urination) *BOWEL PROBLEMS (unusual diarrhea, constipation, pain near the anus) TENDERNESS IN MOUTH AND THROAT WITH OR WITHOUT PRESENCE OF ULCERS (sore throat, sores in mouth, or a toothache) UNUSUAL RASH, SWELLING OR PAIN  UNUSUAL VAGINAL DISCHARGE OR ITCHING   Items with * indicate a potential emergency and should be followed up as soon as possible or go to the Emergency Department if any problems should occur.  Please show the CHEMOTHERAPY ALERT CARD or  IMMUNOTHERAPY ALERT CARD at check-in to the Emergency Department and triage nurse.  Should you have questions after your visit or need to cancel or reschedule your appointment, please contact [facility]  Dept: [phone]  and follow the prompts.  Office hours are 8:00 a.m. to 4:30 p.m. Monday - Friday. Please note that voicemails left after 4:00 p.m. may not be returned until the following business day.  We are closed weekends and major holidays. You have access to a nurse at all times for urgent questions. Please call the main number to the clinic Dept: [phone] and follow the prompts.   For any non-urgent questions, you may also contact your provider using MyChart. We now offer e-Visits for anyone 22 and older to request care online for non-urgent symptoms. For details visit [email or web address].   Also download the MyChart app! Go to the app store, search "MyChart", open the app, select [place], and log in with your MyChart username and password.  Carfilzomib Injection What is this medication? CARFILZOMIB (kar FILZ oh mib) treats multiple myeloma, a type of bone marrow cancer. It works by blocking a protein that causes cancer cells to grow and multiply. This helps to slow or stop the spread of cancer cells. This medicine may be used for other purposes; ask your health care provider or pharmacist if you have questions. COMMON BRAND NAME(S): KYPROLIS What should I tell my care team before I take this medication? They need to know if you have any of these conditions:  Heart disease History of blood clots Irregular heartbeat Kidney disease Liver disease Lung or breathing disease An unusual or allergic reaction to carfilzomib, or other medications, foods, dyes, or preservatives If you or your partner are pregnant or trying to get pregnant Breastfeeding How should I use this medication? This medication is injected into a  vein. It is given by your care team in a hospital or clinic setting. Talk to your care team about the use of this medication in children. Special care may be needed. Overdosage: If you think you have taken too much of this medicine contact a poison control center or emergency room at once. NOTE: This medicine is only for you. Do not share this medicine with others. What if I miss a dose? Keep appointments for follow-up doses. It is important not to miss your dose. Call your care team if you are unable to keep an appointment. What may interact with this medication? Interactions are not expected. This list may not describe all possible interactions. Give your health care provider a list of all the medicines, herbs, non-prescription drugs, or dietary supplements you use. Also tell them if you smoke, drink alcohol, or use illegal drugs. Some items may interact with your medicine. What should I watch for while using this medication? Your condition will be monitored carefully while you are receiving this medication. You may need blood work while taking this medication. Check with your care team if you have severe diarrhea, nausea, and vomiting, or if you sweat a lot. The loss of too much body fluid may make it dangerous for you to take this medication. This medication may affect your coordination, reaction time, or judgment. Do not drive or operate machinery until you know how this medication affects you. Sit up or stand slowly to reduce the risk of dizzy or fainting spells. Drinking alcohol with this medication can increase the risk of these side effects. Talk to your care team if you may be pregnant. Serious birth defects can occur if you take this medication during pregnancy and for 6 months after the last dose. You will need a negative pregnancy test before starting this medication. Contraception is recommended while taking this medication and for 6 months after the last dose. Your care team can help you  find an option that works for you. If your partner can get pregnant, use a condom during sex while taking this medication and for 3 months after the last dose. Do not breastfeed while taking this medication and for 2 weeks after the last dose. This medication may cause infertility. Talk to your care team if you are concerned about your fertility. What side effects may I notice from receiving this medication? Side effects that you should report to your care team as soon as possible: Allergic reactions--skin rash, itching, hives, swelling of the face, lips, tongue, or throat Bleeding--bloody or black, tar-like stools, vomiting blood or brown material that looks like coffee grounds, red or dark brown urine, small red or purple spots on skin, unusual bruising or bleeding Blood clot--pain, swelling, or warmth in the leg, shortness of breath, chest pain Dizziness, loss of balance or coordination, confusion or trouble speaking Heart attack--pain or tightness in the chest, shoulders, arms, or jaw, nausea, shortness of breath, cold or clammy skin, feeling faint or lightheaded Heart failure--shortness of breath, swelling of the ankles, feet, or hands, sudden weight gain, unusual weakness or fatigue Heart rhythm changes--fast or irregular heartbeat, dizziness, feeling faint or lightheaded, chest pain, trouble breathing  Increase in blood pressure Infection--fever, chills, cough, sore throat, wounds that don't heal, pain or trouble when passing urine, general feeling of discomfort or being unwell Infusion reactions--chest pain, shortness of breath or trouble breathing, feeling faint or lightheaded Kidney injury--decrease in the amount of urine, swelling of the ankles, hands, or feet Liver injury--right upper belly pain, loss of appetite, nausea, light-colored stool, dark yellow or brown urine, yellowing skin or eyes, unusual weakness or fatigue Lung injury--shortness of breath or trouble breathing, cough,  spitting up blood, chest pain, fever Pulmonary hypertension--shortness of breath, chest pain, fast or irregular heartbeat, feeling faint or lightheaded, fatigue, swelling of the ankles or feet Stomach pain, bloody diarrhea, pale skin, unusual weakness or fatigue, decrease in the amount of urine, which may be signs of hemolytic uremic syndrome Sudden and severe headache, confusion, change in vision, seizures, which may be signs of posterior reversible encephalopathy syndrome (PRES) TTP--purple spots on the skin or inside the mouth, pale skin, yellowing skin or eyes, unusual weakness or fatigue, fever, fast or irregular heartbeat, confusion, change in vision, trouble speaking, trouble walking Tumor lysis syndrome (TLS)--nausea, vomiting, diarrhea, decrease in the amount of urine, dark urine, unusual weakness or fatigue, confusion, muscle pain or cramps, fast or irregular heartbeat, joint pain Side effects that usually do not require medical attention (report to your care team if they continue or are bothersome): Diarrhea Fatigue Nausea Trouble sleeping This list may not describe all possible side effects. Call your doctor for medical advice about side effects. You may report side effects to FDA at 1-800-FDA-1088. Where should I keep my medication? This medication is given in a hospital or clinic. It will not be stored at home. NOTE: This sheet is a summary. It may not cover all possible information. If you have questions about this medicine, talk to your doctor, pharmacist, or health care provider.  2024 Elsevier/Gold Standard (2022-04-30 00:00:00)

## 2024-07-02 ENCOUNTER — Encounter: Payer: Self-pay | Admitting: Hematology

## 2024-07-04 ENCOUNTER — Other Ambulatory Visit: Payer: Self-pay

## 2024-07-13 ENCOUNTER — Other Ambulatory Visit: Payer: Self-pay

## 2024-07-13 ENCOUNTER — Inpatient Hospital Stay

## 2024-07-13 VITALS — BP 123/70 | HR 73 | Temp 98.2°F | Resp 17 | Wt 152.2 lb

## 2024-07-13 DIAGNOSIS — Z7189 Other specified counseling: Secondary | ICD-10-CM

## 2024-07-13 DIAGNOSIS — Z79899 Other long term (current) drug therapy: Secondary | ICD-10-CM | POA: Diagnosis not present

## 2024-07-13 DIAGNOSIS — Z95828 Presence of other vascular implants and grafts: Secondary | ICD-10-CM

## 2024-07-13 DIAGNOSIS — C9000 Multiple myeloma not having achieved remission: Secondary | ICD-10-CM

## 2024-07-13 DIAGNOSIS — Z5112 Encounter for antineoplastic immunotherapy: Secondary | ICD-10-CM | POA: Diagnosis not present

## 2024-07-13 LAB — CBC WITH DIFFERENTIAL (CANCER CENTER ONLY)
Abs Immature Granulocytes: 0.03 K/uL (ref 0.00–0.07)
Basophils Absolute: 0.1 K/uL (ref 0.0–0.1)
Basophils Relative: 1 %
Eosinophils Absolute: 0.1 K/uL (ref 0.0–0.5)
Eosinophils Relative: 2 %
HCT: 39 % (ref 36.0–46.0)
Hemoglobin: 13.4 g/dL (ref 12.0–15.0)
Immature Granulocytes: 1 %
Lymphocytes Relative: 20 %
Lymphs Abs: 1.3 K/uL (ref 0.7–4.0)
MCH: 33.7 pg (ref 26.0–34.0)
MCHC: 34.4 g/dL (ref 30.0–36.0)
MCV: 98 fL (ref 80.0–100.0)
Monocytes Absolute: 1.3 K/uL — ABNORMAL HIGH (ref 0.1–1.0)
Monocytes Relative: 20 %
Neutro Abs: 3.7 K/uL (ref 1.7–7.7)
Neutrophils Relative %: 56 %
Platelet Count: 172 K/uL (ref 150–400)
RBC: 3.98 MIL/uL (ref 3.87–5.11)
RDW: 13.7 % (ref 11.5–15.5)
WBC Count: 6.4 K/uL (ref 4.0–10.5)
nRBC: 0 % (ref 0.0–0.2)

## 2024-07-13 LAB — CMP (CANCER CENTER ONLY)
ALT: 14 U/L (ref 0–44)
AST: 12 U/L — ABNORMAL LOW (ref 15–41)
Albumin: 3.8 g/dL (ref 3.5–5.0)
Alkaline Phosphatase: 89 U/L (ref 38–126)
Anion gap: 10 (ref 5–15)
BUN: 20 mg/dL (ref 8–23)
CO2: 22 mmol/L (ref 22–32)
Calcium: 9.4 mg/dL (ref 8.9–10.3)
Chloride: 105 mmol/L (ref 98–111)
Creatinine: 0.89 mg/dL (ref 0.44–1.00)
GFR, Estimated: >60 mL/min (ref >60)
Glucose, Bld: 286 mg/dL — ABNORMAL HIGH (ref 70–99)
Potassium: 3.7 mmol/L (ref 3.5–5.1)
Sodium: 137 mmol/L (ref 135–145)
Total Bilirubin: 0.9 mg/dL (ref 0.0–1.2)
Total Protein: 6.2 g/dL — ABNORMAL LOW (ref 6.5–8.1)

## 2024-07-13 MED ORDER — PROCHLORPERAZINE MALEATE 10 MG PO TABS
10.0000 mg | ORAL_TABLET | Freq: Once | ORAL | Status: AC
  Administered 2024-07-13: 10 mg via ORAL
  Filled 2024-07-13: qty 1

## 2024-07-13 MED ORDER — FAMOTIDINE IN NACL 20-0.9 MG/50ML-% IV SOLN
20.0000 mg | Freq: Once | INTRAVENOUS | Status: AC
  Administered 2024-07-13: 20 mg via INTRAVENOUS
  Filled 2024-07-13: qty 50

## 2024-07-13 MED ORDER — SODIUM CHLORIDE 0.9% FLUSH
10.0000 mL | Freq: Once | INTRAVENOUS | Status: AC
Start: 2024-07-13 — End: 2024-07-13
  Administered 2024-07-13: 10 mL

## 2024-07-13 MED ORDER — DIPHENHYDRAMINE HCL 25 MG PO CAPS
25.0000 mg | ORAL_CAPSULE | Freq: Once | ORAL | Status: AC
  Administered 2024-07-13: 25 mg via ORAL
  Filled 2024-07-13: qty 1

## 2024-07-13 MED ORDER — SODIUM CHLORIDE 0.9 % IV SOLN: Freq: Once | INTRAVENOUS | Status: AC

## 2024-07-13 MED ORDER — SODIUM CHLORIDE 0.9% FLUSH: 10.0000 mL | INTRAVENOUS | Status: DC | PRN

## 2024-07-13 MED ORDER — DEXAMETHASONE 4 MG PO TABS
8.0000 mg | ORAL_TABLET | Freq: Once | ORAL | Status: AC
  Administered 2024-07-13: 8 mg via ORAL
  Filled 2024-07-13: qty 2

## 2024-07-13 MED ORDER — DEXTROSE 5 % IV SOLN
36.0000 mg/m2 | Freq: Once | INTRAVENOUS | Status: AC
  Administered 2024-07-13: 60 mg via INTRAVENOUS
  Filled 2024-07-13: qty 30

## 2024-07-13 MED ORDER — LENALIDOMIDE 5 MG PO CAPS: ORAL_CAPSULE | ORAL | 0 refills | Status: DC

## 2024-07-13 MED ORDER — ACETAMINOPHEN 325 MG PO TABS
650.0000 mg | ORAL_TABLET | Freq: Once | ORAL | Status: AC
  Administered 2024-07-13: 650 mg via ORAL
  Filled 2024-07-13: qty 2

## 2024-07-13 NOTE — Patient Instructions (Signed)
 [facility]  Discharge Instructions: Thank you for choosing [facility] to provide your oncology and hematology care.   If you have a lab appointment with the Cancer Center, please go directly to the Cancer Center and check in at the registration area.   Wear comfortable clothing and clothing appropriate for easy access to any Portacath or PICC line.   We strive to give you quality time with your provider. You may need to reschedule your appointment if you arrive late (15 or more minutes).  Arriving late affects you and other patients whose appointments are after yours.  Also, if you miss three or more appointments without notifying the office, you may be dismissed from the clinic at the provider's discretion.      For prescription refill requests, have your pharmacy contact our office and allow 72 hours for refills to be completed.    Today you received the following chemotherapy and/or immunotherapy agents: carfilzomib      To help prevent nausea and vomiting after your treatment, we encourage you to take your nausea medication as directed.  BELOW ARE SYMPTOMS THAT SHOULD BE REPORTED IMMEDIATELY: *FEVER GREATER THAN 100.4 F (38 C) OR HIGHER *CHILLS OR SWEATING *NAUSEA AND VOMITING THAT IS NOT CONTROLLED WITH YOUR NAUSEA MEDICATION *UNUSUAL SHORTNESS OF BREATH *UNUSUAL BRUISING OR BLEEDING *URINARY PROBLEMS (pain or burning when urinating, or frequent urination) *BOWEL PROBLEMS (unusual diarrhea, constipation, pain near the anus) TENDERNESS IN MOUTH AND THROAT WITH OR WITHOUT PRESENCE OF ULCERS (sore throat, sores in mouth, or a toothache) UNUSUAL RASH, SWELLING OR PAIN  UNUSUAL VAGINAL DISCHARGE OR ITCHING   Items with * indicate a potential emergency and should be followed up as soon as possible or go to the Emergency Department if any problems should occur.  Please show the CHEMOTHERAPY ALERT CARD or  IMMUNOTHERAPY ALERT CARD at check-in to the Emergency Department and triage nurse.  Should you have questions after your visit or need to cancel or reschedule your appointment, please contact [facility]  Dept: [phone]  and follow the prompts.  Office hours are 8:00 a.m. to 4:30 p.m. Monday - Friday. Please note that voicemails left after 4:00 p.m. may not be returned until the following business day.  We are closed weekends and major holidays. You have access to a nurse at all times for urgent questions. Please call the main number to the clinic Dept: [phone] and follow the prompts.   For any non-urgent questions, you may also contact your provider using MyChart. We now offer e-Visits for anyone 73 and older to request care online for non-urgent symptoms. For details visit [email or web address].   Also download the MyChart app! Go to the app store, search "MyChart", open the app, select [place], and log in with your MyChart username and password.

## 2024-07-20 ENCOUNTER — Other Ambulatory Visit: Payer: Self-pay | Admitting: Internal Medicine

## 2024-07-21 ENCOUNTER — Other Ambulatory Visit: Payer: Self-pay

## 2024-07-24 ENCOUNTER — Other Ambulatory Visit: Payer: Self-pay

## 2024-07-24 ENCOUNTER — Encounter: Payer: Self-pay | Admitting: Hematology

## 2024-07-24 DIAGNOSIS — C9000 Multiple myeloma not having achieved remission: Secondary | ICD-10-CM

## 2024-07-25 ENCOUNTER — Encounter: Payer: Self-pay | Admitting: Hematology

## 2024-07-25 ENCOUNTER — Other Ambulatory Visit: Payer: Self-pay | Admitting: Internal Medicine

## 2024-07-25 MED ORDER — OXYCODONE HCL 10 MG PO TABS
10.0000 mg | ORAL_TABLET | Freq: Four times a day (QID) | ORAL | 0 refills | Status: DC | PRN
Start: 2024-07-25 — End: 2024-09-13

## 2024-07-28 ENCOUNTER — Inpatient Hospital Stay: Attending: Hematology

## 2024-07-28 ENCOUNTER — Inpatient Hospital Stay: Admitting: Hematology

## 2024-07-28 ENCOUNTER — Inpatient Hospital Stay

## 2024-07-28 VITALS — BP 118/54 | HR 66 | Resp 18

## 2024-07-28 DIAGNOSIS — Z79899 Other long term (current) drug therapy: Secondary | ICD-10-CM | POA: Insufficient documentation

## 2024-07-28 DIAGNOSIS — C9000 Multiple myeloma not having achieved remission: Secondary | ICD-10-CM

## 2024-07-28 DIAGNOSIS — Z5112 Encounter for antineoplastic immunotherapy: Secondary | ICD-10-CM | POA: Diagnosis not present

## 2024-07-28 DIAGNOSIS — Z7189 Other specified counseling: Secondary | ICD-10-CM

## 2024-07-28 DIAGNOSIS — Z95828 Presence of other vascular implants and grafts: Secondary | ICD-10-CM

## 2024-07-28 LAB — CBC WITH DIFFERENTIAL (CANCER CENTER ONLY)
Abs Immature Granulocytes: 0.01 K/uL (ref 0.00–0.07)
Basophils Absolute: 0.1 K/uL (ref 0.0–0.1)
Basophils Relative: 1 %
Eosinophils Absolute: 0.1 K/uL (ref 0.0–0.5)
Eosinophils Relative: 2 %
HCT: 37.6 % (ref 36.0–46.0)
Hemoglobin: 12.9 g/dL (ref 12.0–15.0)
Immature Granulocytes: 0 %
Lymphocytes Relative: 23 %
Lymphs Abs: 1 K/uL (ref 0.7–4.0)
MCH: 34.1 pg — ABNORMAL HIGH (ref 26.0–34.0)
MCHC: 34.3 g/dL (ref 30.0–36.0)
MCV: 99.5 fL (ref 80.0–100.0)
Monocytes Absolute: 0.9 K/uL (ref 0.1–1.0)
Monocytes Relative: 19 %
Neutro Abs: 2.4 K/uL (ref 1.7–7.7)
Neutrophils Relative %: 55 %
Platelet Count: 165 K/uL (ref 150–400)
RBC: 3.78 MIL/uL — ABNORMAL LOW (ref 3.87–5.11)
RDW: 13.4 % (ref 11.5–15.5)
WBC Count: 4.5 K/uL (ref 4.0–10.5)
nRBC: 0 % (ref 0.0–0.2)

## 2024-07-28 LAB — CMP (CANCER CENTER ONLY)
ALT: 13 U/L (ref 0–44)
AST: 12 U/L — ABNORMAL LOW (ref 15–41)
Albumin: 3.7 g/dL (ref 3.5–5.0)
Alkaline Phosphatase: 82 U/L (ref 38–126)
Anion gap: 9 (ref 5–15)
BUN: 16 mg/dL (ref 8–23)
CO2: 23 mmol/L (ref 22–32)
Calcium: 9.2 mg/dL (ref 8.9–10.3)
Chloride: 107 mmol/L (ref 98–111)
Creatinine: 0.8 mg/dL (ref 0.44–1.00)
GFR, Estimated: >60 mL/min (ref >60)
Glucose, Bld: 281 mg/dL — ABNORMAL HIGH (ref 70–99)
Potassium: 3.8 mmol/L (ref 3.5–5.1)
Sodium: 139 mmol/L (ref 135–145)
Total Bilirubin: 0.7 mg/dL (ref 0.0–1.2)
Total Protein: 5.7 g/dL — ABNORMAL LOW (ref 6.5–8.1)

## 2024-07-28 MED ORDER — DEXTROSE 5 % IV SOLN
36.0000 mg/m2 | Freq: Once | INTRAVENOUS | Status: AC
  Administered 2024-07-28: 60 mg via INTRAVENOUS
  Filled 2024-07-28: qty 30

## 2024-07-28 MED ORDER — SODIUM CHLORIDE 0.9% FLUSH
10.0000 mL | Freq: Once | INTRAVENOUS | Status: AC
  Administered 2024-07-28: 10 mL

## 2024-07-28 MED ORDER — SODIUM CHLORIDE 0.9 % IV SOLN
Freq: Once | INTRAVENOUS | Status: AC
Start: 2024-07-28 — End: 2024-07-28

## 2024-07-28 MED ORDER — FAMOTIDINE IN NACL 20-0.9 MG/50ML-% IV SOLN
20.0000 mg | Freq: Once | INTRAVENOUS | Status: AC
  Administered 2024-07-28: 20 mg via INTRAVENOUS
  Filled 2024-07-28: qty 50

## 2024-07-28 MED ORDER — DEXAMETHASONE 4 MG PO TABS
8.0000 mg | ORAL_TABLET | Freq: Once | ORAL | Status: AC
  Administered 2024-07-28: 8 mg via ORAL
  Filled 2024-07-28: qty 2

## 2024-07-28 MED ORDER — DIPHENHYDRAMINE HCL 25 MG PO CAPS
25.0000 mg | ORAL_CAPSULE | Freq: Once | ORAL | Status: AC
  Administered 2024-07-28: 25 mg via ORAL
  Filled 2024-07-28: qty 1

## 2024-07-28 MED ORDER — PROCHLORPERAZINE MALEATE 10 MG PO TABS
10.0000 mg | ORAL_TABLET | Freq: Once | ORAL | Status: AC
  Administered 2024-07-28: 10 mg via ORAL
  Filled 2024-07-28: qty 1

## 2024-07-28 MED ORDER — ACETAMINOPHEN 325 MG PO TABS
650.0000 mg | ORAL_TABLET | Freq: Once | ORAL | Status: AC
  Administered 2024-07-28: 650 mg via ORAL
  Filled 2024-07-28: qty 2

## 2024-07-28 NOTE — Patient Instructions (Signed)
 [facility] - A DEPT OF [facility]  Discharge Instructions: Thank you for choosing [facility] to provide your oncology and hematology care.   If you have a lab appointment with the Cancer Center, please go directly to the Cancer Center and check in at the registration area.   Wear comfortable clothing and clothing appropriate for easy access to any Portacath or PICC line.   We strive to give you quality time with your provider. You may need to reschedule your appointment if you arrive late (15 or more minutes).  Arriving late affects you and other patients whose appointments are after yours.  Also, if you miss three or more appointments without notifying the office, you may be dismissed from the clinic at the provider's discretion.      For prescription refill requests, have your pharmacy contact our office and allow 72 hours for refills to be completed.    Today you received the following chemotherapy and/or immunotherapy agents: kyprolis     Carfilzomib Injection What is this medication? CARFILZOMIB (kar FILZ oh mib) treats multiple myeloma, a type of bone marrow cancer. It works by blocking a protein that causes cancer cells to grow and multiply. This helps to slow or stop the spread of cancer cells. This medicine may be used for other purposes; ask your health care provider or pharmacist if you have questions. COMMON BRAND NAME(S): KYPROLIS What should I tell my care team before I take this medication? They need to know if you have any of these conditions: Heart disease History of blood clots Irregular heartbeat Kidney disease Liver disease Lung or breathing disease An unusual or allergic reaction to carfilzomib, or other medications, foods, dyes, or preservatives If you or your partner are pregnant or trying to get pregnant Breastfeeding How should I use this medication? This medication is injected into a vein. It is given by your  care team in a hospital or clinic setting. Talk to your care team about the use of this medication in children. Special care may be needed. Overdosage: If you think you have taken too much of this medicine contact a poison control center or emergency room at once. NOTE: This medicine is only for you. Do not share this medicine with others. What if I miss a dose? Keep appointments for follow-up doses. It is important not to miss your dose. Call your care team if you are unable to keep an appointment. What may interact with this medication? Interactions are not expected. This list may not describe all possible interactions. Give your health care provider a list of all the medicines, herbs, non-prescription drugs, or dietary supplements you use. Also tell them if you smoke, drink alcohol, or use illegal drugs. Some items may interact with your medicine. What should I watch for while using this medication? Your condition will be monitored carefully while you are receiving this medication. You may need blood work while taking this medication. Check with your care team if you have severe diarrhea, nausea, and vomiting, or if you sweat a lot. The loss of too much body fluid may make it dangerous for you to take this medication. This medication may affect your coordination, reaction time, or judgment. Do not drive or operate machinery until you know how this medication affects you. Sit up or stand slowly to reduce the risk of dizzy or fainting spells. Drinking alcohol with this medication can increase the risk of these side effects.  Talk to your care team if you may be pregnant. Serious birth defects can occur if you take this medication during pregnancy and for 6 months after the last dose. You will need a negative pregnancy test before starting this medication. Contraception is recommended while taking this medication and for 6 months after the last dose. Your care team can help you find an option that works  for you. If your partner can get pregnant, use a condom during sex while taking this medication and for 3 months after the last dose. Do not breastfeed while taking this medication and for 2 weeks after the last dose. This medication may cause infertility. Talk to your care team if you are concerned about your fertility. What side effects may I notice from receiving this medication? Side effects that you should report to your care team as soon as possible: Allergic reactions--skin rash, itching, hives, swelling of the face, lips, tongue, or throat Bleeding--bloody or black, tar-like stools, vomiting blood or brown material that looks like coffee grounds, red or dark brown urine, small red or purple spots on skin, unusual bruising or bleeding Blood clot--pain, swelling, or warmth in the leg, shortness of breath, chest pain Dizziness, loss of balance or coordination, confusion or trouble speaking Heart attack--pain or tightness in the chest, shoulders, arms, or jaw, nausea, shortness of breath, cold or clammy skin, feeling faint or lightheaded Heart failure--shortness of breath, swelling of the ankles, feet, or hands, sudden weight gain, unusual weakness or fatigue Heart rhythm changes--fast or irregular heartbeat, dizziness, feeling faint or lightheaded, chest pain, trouble breathing Increase in blood pressure Infection--fever, chills, cough, sore throat, wounds that don't heal, pain or trouble when passing urine, general feeling of discomfort or being unwell Infusion reactions--chest pain, shortness of breath or trouble breathing, feeling faint or lightheaded Kidney injury--decrease in the amount of urine, swelling of the ankles, hands, or feet Liver injury--right upper belly pain, loss of appetite, nausea, light-colored stool, dark yellow or brown urine, yellowing skin or eyes, unusual weakness or fatigue Lung injury--shortness of breath or trouble breathing, cough, spitting up blood, chest pain,  fever Pulmonary hypertension--shortness of breath, chest pain, fast or irregular heartbeat, feeling faint or lightheaded, fatigue, swelling of the ankles or feet Stomach pain, bloody diarrhea, pale skin, unusual weakness or fatigue, decrease in the amount of urine, which may be signs of hemolytic uremic syndrome Sudden and severe headache, confusion, change in vision, seizures, which may be signs of posterior reversible encephalopathy syndrome (PRES) TTP--purple spots on the skin or inside the mouth, pale skin, yellowing skin or eyes, unusual weakness or fatigue, fever, fast or irregular heartbeat, confusion, change in vision, trouble speaking, trouble walking Tumor lysis syndrome (TLS)--nausea, vomiting, diarrhea, decrease in the amount of urine, dark urine, unusual weakness or fatigue, confusion, muscle pain or cramps, fast or irregular heartbeat, joint pain Side effects that usually do not require medical attention (report to your care team if they continue or are bothersome): Diarrhea Fatigue Nausea Trouble sleeping This list may not describe all possible side effects. Call your doctor for medical advice about side effects. You may report side effects to FDA at 1-800-FDA-1088. Where should I keep my medication? This medication is given in a hospital or clinic. It will not be stored at home. NOTE: This sheet is a summary. It may not cover all possible information. If you have questions about this medicine, talk to your doctor, pharmacist, or health care provider.  2024 Elsevier/Gold Standard (2022-04-30 00:00:00)  To help prevent nausea and vomiting after your treatment, we encourage you to take your nausea medication as directed.  BELOW ARE SYMPTOMS THAT SHOULD BE REPORTED IMMEDIATELY: *FEVER GREATER THAN 100.4 F (38 C) OR HIGHER *CHILLS OR SWEATING *NAUSEA AND VOMITING THAT IS NOT CONTROLLED WITH YOUR NAUSEA MEDICATION *UNUSUAL SHORTNESS OF BREATH *UNUSUAL BRUISING OR  BLEEDING *URINARY PROBLEMS (pain or burning when urinating, or frequent urination) *BOWEL PROBLEMS (unusual diarrhea, constipation, pain near the anus) TENDERNESS IN MOUTH AND THROAT WITH OR WITHOUT PRESENCE OF ULCERS (sore throat, sores in mouth, or a toothache) UNUSUAL RASH, SWELLING OR PAIN  UNUSUAL VAGINAL DISCHARGE OR ITCHING   Items with * indicate a potential emergency and should be followed up as soon as possible or go to the Emergency Department if any problems should occur.  Please show the CHEMOTHERAPY ALERT CARD or IMMUNOTHERAPY ALERT CARD at check-in to the Emergency Department and triage nurse.  Should you have questions after your visit or need to cancel or reschedule your appointment, please contact [facility] - A DEPT OF [facility]  Dept: [phone]  and follow the prompts.  Office hours are 8:00 a.m. to 4:30 p.m. Monday - Friday. Please note that voicemails left after 4:00 p.m. may not be returned until the following business day.  We are closed weekends and major holidays. You have access to a nurse at all times for urgent questions. Please call the main number to the clinic Dept: [phone] and follow the prompts.   For any non-urgent questions, you may also contact your provider using MyChart. We now offer e-Visits for anyone 61 and older to request care online for non-urgent symptoms. For details visit [email or web address].   Also download the MyChart app! Go to the app store, search MyChart, open the app, select [place], and log in with your MyChart username and password.

## 2024-08-02 ENCOUNTER — Other Ambulatory Visit: Payer: Self-pay

## 2024-08-08 ENCOUNTER — Other Ambulatory Visit: Payer: Self-pay

## 2024-08-09 ENCOUNTER — Other Ambulatory Visit: Payer: Self-pay

## 2024-08-10 ENCOUNTER — Inpatient Hospital Stay

## 2024-08-10 VITALS — BP 109/59 | HR 60 | Temp 98.0°F | Resp 16 | Ht 59.0 in | Wt 152.5 lb

## 2024-08-10 DIAGNOSIS — Z79899 Other long term (current) drug therapy: Secondary | ICD-10-CM | POA: Diagnosis not present

## 2024-08-10 DIAGNOSIS — Z7189 Other specified counseling: Secondary | ICD-10-CM

## 2024-08-10 DIAGNOSIS — C9000 Multiple myeloma not having achieved remission: Secondary | ICD-10-CM | POA: Diagnosis not present

## 2024-08-10 DIAGNOSIS — Z5112 Encounter for antineoplastic immunotherapy: Secondary | ICD-10-CM | POA: Diagnosis not present

## 2024-08-10 LAB — CBC WITH DIFFERENTIAL (CANCER CENTER ONLY)
Abs Immature Granulocytes: 0.03 K/uL (ref 0.00–0.07)
Basophils Absolute: 0.1 K/uL (ref 0.0–0.1)
Basophils Relative: 1 %
Eosinophils Absolute: 0.1 K/uL (ref 0.0–0.5)
Eosinophils Relative: 2 %
HCT: 38.8 % (ref 36.0–46.0)
Hemoglobin: 13.1 g/dL (ref 12.0–15.0)
Immature Granulocytes: 1 %
Lymphocytes Relative: 20 %
Lymphs Abs: 1.1 K/uL (ref 0.7–4.0)
MCH: 33.3 pg (ref 26.0–34.0)
MCHC: 33.8 g/dL (ref 30.0–36.0)
MCV: 98.7 fL (ref 80.0–100.0)
Monocytes Absolute: 1.3 K/uL — ABNORMAL HIGH (ref 0.1–1.0)
Monocytes Relative: 24 %
Neutro Abs: 2.9 K/uL (ref 1.7–7.7)
Neutrophils Relative %: 52 %
Platelet Count: 169 K/uL (ref 150–400)
RBC: 3.93 MIL/uL (ref 3.87–5.11)
RDW: 13.2 % (ref 11.5–15.5)
WBC Count: 5.5 K/uL (ref 4.0–10.5)
nRBC: 0 % (ref 0.0–0.2)

## 2024-08-10 LAB — CMP (CANCER CENTER ONLY)
ALT: 13 U/L (ref 0–44)
AST: 12 U/L — ABNORMAL LOW (ref 15–41)
Albumin: 3.7 g/dL (ref 3.5–5.0)
Alkaline Phosphatase: 88 U/L (ref 38–126)
Anion gap: 6 (ref 5–15)
BUN: 15 mg/dL (ref 8–23)
CO2: 25 mmol/L (ref 22–32)
Calcium: 9.6 mg/dL (ref 8.9–10.3)
Chloride: 107 mmol/L (ref 98–111)
Creatinine: 0.94 mg/dL (ref 0.44–1.00)
GFR, Estimated: >60 mL/min (ref >60)
Glucose, Bld: 269 mg/dL — ABNORMAL HIGH (ref 70–99)
Potassium: 3.9 mmol/L (ref 3.5–5.1)
Sodium: 138 mmol/L (ref 135–145)
Total Bilirubin: 0.7 mg/dL (ref 0.0–1.2)
Total Protein: 5.8 g/dL — ABNORMAL LOW (ref 6.5–8.1)

## 2024-08-10 MED ORDER — PROCHLORPERAZINE MALEATE 10 MG PO TABS
10.0000 mg | ORAL_TABLET | Freq: Once | ORAL | Status: AC
  Administered 2024-08-10: 10 mg via ORAL
  Filled 2024-08-10: qty 1

## 2024-08-10 MED ORDER — SODIUM CHLORIDE 0.9% FLUSH
10.0000 mL | INTRAVENOUS | Status: DC | PRN
  Administered 2024-08-10: 10 mL

## 2024-08-10 MED ORDER — DIPHENHYDRAMINE HCL 25 MG PO CAPS
25.0000 mg | ORAL_CAPSULE | Freq: Once | ORAL | Status: AC
  Administered 2024-08-10: 25 mg via ORAL
  Filled 2024-08-10: qty 1

## 2024-08-10 MED ORDER — FAMOTIDINE IN NACL 20-0.9 MG/50ML-% IV SOLN
20.0000 mg | Freq: Once | INTRAVENOUS | Status: AC
  Administered 2024-08-10: 20 mg via INTRAVENOUS
  Filled 2024-08-10: qty 50

## 2024-08-10 MED ORDER — SODIUM CHLORIDE 0.9 % IV SOLN: Freq: Once | INTRAVENOUS | Status: AC

## 2024-08-10 MED ORDER — DEXAMETHASONE 4 MG PO TABS
8.0000 mg | ORAL_TABLET | Freq: Once | ORAL | Status: AC
  Administered 2024-08-10: 8 mg via ORAL
  Filled 2024-08-10: qty 2

## 2024-08-10 MED ORDER — DEXTROSE 5 % IV SOLN
36.0000 mg/m2 | Freq: Once | INTRAVENOUS | Status: AC
  Administered 2024-08-10: 60 mg via INTRAVENOUS
  Filled 2024-08-10: qty 30

## 2024-08-10 MED ORDER — ACETAMINOPHEN 325 MG PO TABS
650.0000 mg | ORAL_TABLET | Freq: Once | ORAL | Status: AC
  Administered 2024-08-10: 650 mg via ORAL
  Filled 2024-08-10: qty 2

## 2024-08-10 NOTE — Patient Instructions (Signed)
 [facility] - A DEPT OF [facility]  Discharge Instructions: Thank you for choosing [facility] to provide your oncology and hematology care.   If you have a lab appointment with the Cancer Center, please go directly to the Cancer Center and check in at the registration area.   Wear comfortable clothing and clothing appropriate for easy access to any Portacath or PICC line.   We strive to give you quality time with your provider. You may need to reschedule your appointment if you arrive late (15 or more minutes).  Arriving late affects you and other patients whose appointments are after yours.  Also, if you miss three or more appointments without notifying the office, you may be dismissed from the clinic at the provider's discretion.      For prescription refill requests, have your pharmacy contact our office and allow 72 hours for refills to be completed.    Today you received the following chemotherapy and/or immunotherapy agent: Carfilzomib (Kyprolis)      To help prevent nausea and vomiting after your treatment, we encourage you to take your nausea medication as directed.  BELOW ARE SYMPTOMS THAT SHOULD BE REPORTED IMMEDIATELY: *FEVER GREATER THAN 100.4 F (38 C) OR HIGHER *CHILLS OR SWEATING *NAUSEA AND VOMITING THAT IS NOT CONTROLLED WITH YOUR NAUSEA MEDICATION *UNUSUAL SHORTNESS OF BREATH *UNUSUAL BRUISING OR BLEEDING *URINARY PROBLEMS (pain or burning when urinating, or frequent urination) *BOWEL PROBLEMS (unusual diarrhea, constipation, pain near the anus) TENDERNESS IN MOUTH AND THROAT WITH OR WITHOUT PRESENCE OF ULCERS (sore throat, sores in mouth, or a toothache) UNUSUAL RASH, SWELLING OR PAIN  UNUSUAL VAGINAL DISCHARGE OR ITCHING   Items with * indicate a potential emergency and should be followed up as soon as possible or go to the Emergency Department if any problems should occur.  Please show the CHEMOTHERAPY ALERT CARD or  IMMUNOTHERAPY ALERT CARD at check-in to the Emergency Department and triage nurse.  Should you have questions after your visit or need to cancel or reschedule your appointment, please contact [facility]  Dept: [phone]  and follow the prompts.  Office hours are 8:00 a.m. to 4:30 p.m. Monday - Friday. Please note that voicemails left after 4:00 p.m. may not be returned until the following business day.  We are closed weekends and major holidays. You have access to a nurse at all times for urgent questions. Please call the main number to the clinic Dept: [phone] and follow the prompts.   For any non-urgent questions, you may also contact your provider using MyChart. We now offer e-Visits for anyone 81 and older to request care online for non-urgent symptoms. For details visit [email or web address].   Also download the MyChart app! Go to the app store, search "MyChart", open the app, select [place], and log in with your MyChart username and password.

## 2024-08-11 ENCOUNTER — Other Ambulatory Visit: Payer: Self-pay | Admitting: Hematology

## 2024-08-11 DIAGNOSIS — C9000 Multiple myeloma not having achieved remission: Secondary | ICD-10-CM

## 2024-08-15 ENCOUNTER — Other Ambulatory Visit: Payer: Self-pay

## 2024-08-15 ENCOUNTER — Other Ambulatory Visit: Payer: Self-pay | Admitting: Hematology

## 2024-08-15 DIAGNOSIS — E876 Hypokalemia: Secondary | ICD-10-CM

## 2024-08-15 DIAGNOSIS — C9000 Multiple myeloma not having achieved remission: Secondary | ICD-10-CM

## 2024-08-15 DIAGNOSIS — Z7189 Other specified counseling: Secondary | ICD-10-CM

## 2024-08-15 DIAGNOSIS — C7951 Secondary malignant neoplasm of bone: Secondary | ICD-10-CM

## 2024-08-23 ENCOUNTER — Other Ambulatory Visit: Payer: Self-pay | Admitting: Hematology

## 2024-08-23 DIAGNOSIS — C9000 Multiple myeloma not having achieved remission: Secondary | ICD-10-CM

## 2024-08-24 NOTE — Progress Notes (Deleted)
 HEMATOLOGY/ONCOLOGY CLINIC NOTE  Date of Service: 06/02/2024   Patient Care Team: Rollene Almarie LABOR, MD as PCP - General (Internal Medicine) Obie Princella HERO, MD (Inactive) as Consulting Physician (Gastroenterology) Cleotilde Ronal RAMAN, MD as Consulting Physician (Gynecology) Sheril Coy, MD as Consulting Physician (Orthopedic Surgery) Neysa Reggy BIRCH, MD as Consulting Physician (Pulmonary Disease) Cleatus Collar, MD (Ophthalmology)  CHIEF COMPLAINTS/PURPOSE OF VISIT:  Follow-up for continued evaluation and management of multiple myeloma  HISTORY OF PRESENTING ILLNESS:  Please see previous notes for details on initial presentation  Current Treatment: Carfilzomib  + Revlimid  maintenance.  INTERVAL HISTORY:   Faith Orr is a 83 y.o. female who is here for continued evaluation and management of the patient's multiple myeloma. Patient was last seen by Dr. Onesimo on 06/30/2024. She presents unaccompanied for this visit.   [name] reports *** MEDICAL HISTORY:  Past Medical History:  Diagnosis Date   Allergy    seasonal   Asthma    DEPRESSION    DIABETES MELLITUS, TYPE II    Diverticulosis    HYPERLIPIDEMIA    Macular degeneration of left eye    mild, Dr.Hecker   Obesity, unspecified    Osteoarthritis of both knees    OSTEOPENIA    Osteopenia    URINARY INCONTINENCE     SURGICAL HISTORY: Past Surgical History:  Procedure Laterality Date   CATARACT EXTRACTION Left 05/24/2018   CESAREAN SECTION  01/1973   CYSTOSCOPY/URETEROSCOPY/HOLMIUM LASER/STENT PLACEMENT Right 09/20/2019   Procedure: CYSTOSCOPY/URETEROSCOPY/HOLMIUM LASER/STENT PLACEMENT;  Surgeon: Carolee Sherwood BIRCH DOUGLAS, MD;  Location: [facility];  Service: Urology;  Laterality: Right;   FRACTURE SURGERY     IR BONE MARROW BIOPSY & ASPIRATION  02/16/2023   IR IMAGING GUIDED PORT INSERTION  02/20/2019   left wrist surgery  2008   By Dr. Lilly   right ankle  1994    SOCIAL HISTORY: Social  History   Socioeconomic History   Marital status: Married    Spouse name: Not on file   Number of children: 1   Years of education: Not on file   Highest education level: Not on file  Occupational History    Employer: [employer]  Tobacco Use   Smoking status: Never   Smokeless tobacco: Never   Tobacco comments:    Lives with partner Charlann Irving) and son  Vaping Use   Vaping status: Never Used  Substance and Sexual Activity   Alcohol use: No    Alcohol/week: 0.0 standard drinks of alcohol   Drug use: No   Sexual activity: Never    Partners: Female    Birth control/protection: Post-menopausal    Comment: Lives with female partner (annette hicks) and 20 yo son  Other Topics Concern   Not on file  Social History Narrative   Not on file   Social Drivers of Health   Financial Resource Strain: Low Risk  (09/09/2023)   Overall Financial Resource Strain (CARDIA)    Difficulty of Paying Living Expenses: Not hard at all  Food Insecurity: No Food Insecurity (09/09/2023)   Hunger Vital Sign    Worried About Running Out of Food in the Last Year: Never true    Ran Out of Food in the Last Year: Never true  Transportation Needs: No Transportation Needs (09/09/2023)   PRAPARE - Administrator, Civil Service (Medical): No    Lack of Transportation (Non-Medical): No  Physical Activity: Inactive (09/09/2023)   Exercise Vital Sign  Days of Exercise per Week: 0 days    Minutes of Exercise per Session: 0 min  Stress: No Stress Concern Present (09/09/2023)   [employer] of Occupational Health - Occupational Stress Questionnaire    Feeling of Stress : Not at all  Social Connections: Socially Integrated (09/09/2023)   Social Connection and Isolation Panel    Frequency of Communication with Friends and Family: More than three times a week    Frequency of Social Gatherings with Friends and Family: More than three times a week    Attends Religious Services: More  than 4 times per year    Active Member of [employer] or Organizations: Yes    Attends Engineer, structural: More than 4 times per year    Marital Status: Married  Catering manager Violence: Not At Risk (05/08/2021)   Humiliation, Afraid, Rape, and Kick questionnaire    Fear of Current or Ex-Partner: No    Emotionally Abused: No    Physically Abused: No    Sexually Abused: No    FAMILY HISTORY: Family History  Problem Relation Age of Onset   Diabetes Father    Hyperlipidemia Father    Heart disease Father    Cancer Father    Hypertension Father    Colon cancer Paternal Grandmother 74   Osteoporosis Mother    Protein S deficiency Mother    Hyperlipidemia Mother    Multiple sclerosis Daughter    Cancer Other        bladder   Breast cancer Neg Hx     ALLERGIES:  is allergic to levofloxacin , penicillins, aleve [naproxen sodium], and sulfonamide derivatives.  MEDICATIONS:  Current Outpatient Medications  Medication Sig Dispense Refill   acyclovir  (ZOVIRAX ) 400 MG tablet TAKE 1 TABLET(400 MG) BY MOUTH TWICE DAILY 60 tablet 5   Blood Glucose Monitoring Suppl (FREESTYLE FREEDOM LITE) W/DEVICE KIT Use to check blood sugars twice a day Dx 250.00 1 each 0   Calcium  Carbonate-Vitamin D  600-400 MG-UNIT tablet Take 1 tablet by mouth 2 (two) times daily.     dexamethasone  (DECADRON ) 4 MG tablet Take 5 tablets (20 mg total) by mouth once a week. On D22 of each cycle of treatment 20 tablet 5   ELIQUIS  2.5 MG TABS tablet TAKE 1 TABLET(2.5 MG) BY MOUTH TWICE DAILY 60 tablet 2   fentaNYL  (DURAGESIC ) 12 MCG/HR Place 1 patch onto the skin every 3 (three) days. (Patient not taking: Reported on 06/02/2024) 15 patch 0   fluticasone  (FLONASE ) 50 MCG/ACT nasal spray Place 1 spray into both nostrils daily. 48 g 3   gabapentin  (NEURONTIN ) 100 MG capsule Take 1 capsule (100 mg total) by mouth 2 (two) times daily. (Patient not taking: Reported on 04/19/2024) 60 capsule 0   glucose blood (FREESTYLE LITE)  test strip CHECK BLOOD SUGAR TWICE DAILY AS DIRECTED Dx 250.00 180 each 3   Lancets (FREESTYLE) lancets Use twice daily to check sugars. 100 each 11   lenalidomide  (REVLIMID ) 5 MG capsule TAKE 1 CAPSULE BY MOUTH DAILY  FOR 21 DAYS, THEN 7 DAYS OFF 21 capsule 0   lidocaine  (LIDODERM ) 5 % Place 1 patch onto the skin daily. Remove & Discard patch within 12 hours or as directed by MD 30 patch 0   lidocaine -prilocaine  (EMLA ) cream APPLY 1 APPLICATION TO THE AFFECTED AREA AS NEEDED. USE PRIOR TO PORT ACCESS (Patient not taking: Reported on 06/02/2024) 30 g 0   meclizine  (ANTIVERT ) 25 MG tablet Take 1 tablet (25 mg total) by mouth  every 8 (eight) hours as needed for dizziness. 20 tablet 0   metFORMIN  (GLUCOPHAGE -XR) 500 MG 24 hr tablet TAKE 1 TABLET(500 MG) BY MOUTH TWICE DAILY WITH A MEAL 180 tablet 3   metroNIDAZOLE  (METROGEL ) 1 % gel Apply topically in the morning and at bedtime. 60 g 1   Multiple Vitamins-Minerals (ICAPS) CAPS Take 1 capsule by mouth daily after breakfast.     ondansetron  (ZOFRAN ) 8 MG tablet TAKE 1 TABLET(8 MG) BY MOUTH TWICE DAILY AS NEEDED FOR NAUSEA OR VOMITING 30 tablet 1   Oxycodone  HCl 10 MG TABS Take 1 tablet (10 mg total) by mouth every 6 (six) hours as needed. 90 tablet 0   pantoprazole  (PROTONIX ) 20 MG tablet TAKE 1 TABLET(20 MG) BY MOUTH DAILY 30 tablet 2   polyethylene glycol (MIRALAX / GLYCOLAX) packet Take 17 g by mouth daily after breakfast.     potassium chloride  SA (KLOR-CON  M) 20 MEQ tablet TAKE 2 TABLETS(40 MEQ) BY MOUTH TWICE DAILY 360 tablet 1   prochlorperazine  (COMPAZINE ) 10 MG tablet Take 1 tablet (10 mg total) by mouth every 6 (six) hours as needed (Nausea or vomiting). (Patient not taking: Reported on 06/02/2024) 30 tablet 1   senna-docusate (SENNA S) 8.6-50 MG tablet Take 2 tablets by mouth at bedtime. 60 tablet 2   sertraline  (ZOLOFT ) 100 MG tablet TAKE 1 TABLET(100 MG) BY MOUTH DAILY 90 tablet 3   simvastatin  (ZOCOR ) 20 MG tablet TAKE 1 TABLET(20 MG) BY  MOUTH DAILY AT 6 PM 90 tablet 3   tizanidine  (ZANAFLEX ) 2 MG capsule Take 1 capsule (2 mg total) by mouth in the morning and at bedtime. (Patient not taking: Reported on 06/02/2024) 20 capsule 0   Vitamin D , Ergocalciferol , (DRISDOL ) 1.25 MG (50000 UNIT) CAPS capsule TAKE 1 CAPSULE BY MOUTH EVERY 7 DAYS 12 capsule 0   No current facility-administered medications for this visit.   Facility-Administered Medications Ordered in Other Visits  Medication Dose Route Frequency Provider Last Rate Last Admin   heparin  lock flush 100 unit/mL  500 Units Intracatheter Once PRN Kale, Gautam Kishore, MD       sodium chloride  flush (NS) 0.9 % injection 10 mL  10 mL Intracatheter PRN Kale, Gautam Kishore, MD   10 mL at 12/17/23 1336    REVIEW OF SYSTEMS:    10 Point review of Systems was done is negative except as noted above.   PHYSICAL EXAMINATION:  ECOG FS:1 - Symptomatic but completely ambulatory  .LMP 10/09/2012    Wt Readings from Last 3 Encounters:  08/10/24 152 lb 8 oz (69.2 kg)  07/28/24 155 lb (70.3 kg)  07/13/24 152 lb 4 oz (69.1 kg)   There is no height or weight on file to calculate BMI.     GENERAL:alert, in no acute distress and comfortable SKIN: no acute rashes, no significant lesions EYES: conjunctiva are pink and non-injected, sclera anicteric LUNGS: clear to auscultation b/l with normal respiratory effort HEART: regular rate & rhythm Extremity: no pedal edema PSYCH: alert & oriented x 3 with fluent speech NEURO: no focal motor/sensory deficits    LABS     Latest Ref Rng & Units 08/10/2024    1:20 PM 07/28/2024    9:54 AM 07/13/2024   12:01 PM  CBC  WBC 4.0 - 10.5 K/uL 5.5  4.5  6.4   Hemoglobin 12.0 - 15.0 g/dL 86.8  87.0  86.5   Hematocrit 36.0 - 46.0 % 38.8  37.6  39.0   Platelets 150 -  400 K/uL 169  165  172       Latest Ref Rng & Units 08/10/2024    1:20 PM 07/28/2024    9:54 AM 07/13/2024   12:01 PM  CMP  Glucose 70 - 99 mg/dL 730  718  713   BUN 8 - 23  mg/dL 15  16  20    Creatinine 0.44 - 1.00 mg/dL 9.05  9.19  9.10   Sodium 135 - 145 mmol/L 138  139  137   Potassium 3.5 - 5.1 mmol/L 3.9  3.8  3.7   Chloride 98 - 111 mmol/L 107  107  105   CO2 22 - 32 mmol/L 25  23  22    Calcium  8.9 - 10.3 mg/dL 9.6  9.2  9.4   Total Protein 6.5 - 8.1 g/dL 5.8  5.7  6.2   Total Bilirubin 0.0 - 1.2 mg/dL 0.7  0.7  0.9   Alkaline Phos 38 - 126 U/L 88  82  89   AST 15 - 41 U/L 12  12  12    ALT 0 - 44 U/L 13  13  14       09/18/2019 BM Bx Report ([id number])   09/18/2019 FISH Panel    05/30/2019 BM Bx   01/06/2019 BM Bx:     01/06/19 Cytogenetics:      05/30/19 BM Biopsy:   09/18/2019 FISH Panel    09/18/2019 BM Surgical Pathology ([id number])     RADIOGRAPHIC STUDIES: I have personally reviewed the radiological images as listed and agreed with the findings in the report. No results found.   ASSESSMENT & PLAN:  Faith Orr is a 83 y.o. female who presents for continued management of multiple myeloma.  1.  Nonsecretory multiple Myeloma, RISS Stage III  Labs upon initial presentation from 12/08/18, blood counts are normal including WBC at 7.1k, HGB at 13.1, and PLT at 245k. Calcium  normal at 10.3. Creatinine normal at 0.63. M spike at 0.5g. 12/13/18 Bone Scan revealed Multifocal uptake throughout the skeleton, consistent with diffuse metastatic disease. Primary tumor is not specified. 2. Uptake in the proximal right femur, consistent with lytic lesions. 3. Uptake in the ribs bilaterally as described. 4. Lesions in the proximal left humerus. 5. Diffuse uptake throughout the skull consistent with metastatic disease. 6. Right paramedian uptake at the manubrium.  12/13/18 CT Right Femur revealed Numerous lytic lesions involving the right femur and a lytic lesion in the left inferior pubic ramus. Overall appearance is most concerning for multiple myeloma  12/27/18 Pretreatment 24hour UPEP observed an M spike at 18mg , and  showed 199mg  total protein/day.  12/27/18 Pretreatment MMP revealed M Protein at 0.5g with IgG Lambda specificity. Kappa:Lambda light chain ratio at 0.13, with Lambda at 40.3. There is less abnormal protein and light chains than I would expect from 30% plasma cells, which suggests hypo-secretory or non-secretory neoplastic plasma cells. Will have an impact in assessing response. 01/05/19 PET/CT revealed Innumerable lytic lesions in the skeleton compatible with myeloma. Most of the larger lesions are hypermetabolic, for example including a left proximal humeral shaft lesion with maximum SUV of 8.1 and a 2.8 cm lesion in the left T9 vertebral body with maximum SUV 5.1. Most of the smaller lytic lesions, and some of the larger lesions, do not demonstrate accentuated metabolic activity. 2. 1.2 cm in short axis lymph node in the left parapharyngeal space is hypermetabolic with maximum SUV 11.8. I do not see a separate mass in the head and  neck to give rise to this hypermetabolic lymph node. 3. Mosaic attenuation in the lower lobes, nonspecific possibly from air trapping. 4.  Aortic Atherosclerosis 5. Heterogeneous activity in the liver, making it hard to exclude small liver lesions. Consider hepatic protocol MRI with and without contrast for definitive assessment. Nonobstructive right nephrolithiasis. Old granulomatous disease  01/06/19 Bone Marrow biopsy revealed interstitial increase in plasma cells (28% aspirate, 40% CD138 immunohistochemistry). Plasma cells negative for light chains consistent with a non or weakly secretory myeloma   01/06/19 Cytogenetics revealed 37% of cells with trisomy 11 or 11q deletion, and 40.5% of cells with 17p mutation  S/p 5 cycles of KRD treatment  05/31/19 BM Biopsy revealed mild atypical plasmacytosis at 5% with polytypic variation.   06/01/19 PET/CT revealed Dominant lesion in the LEFT humerus is decreased significantly in metabolic activity. Additional hypermetabolic skeletal  lytic lesions have decreased in metabolic activity or similar to comparison exam (01/05/2019). No evidence of disease progression. 2. Multiple additional lytic lesions do not have metabolic activity and unchanged. 3. No new skeletal lesions are identified. No soft tissue plasmacytoma identified. 4. Nodule / node in the LEFT parapharyngeal space which is intensely hypermetabolic not changed from prior. 5. New hypermetabolic LEFT lower lobe pulmonary nodule is indeterminate. Recommend close attention on follow-up 6. New obstructive hydronephrosis of the RIGHT kidney related to RIGHT UPJ stone.  09/18/2019 BM Bx Report which revealed Slightly hypercellular bone marrow for age with trilineage hematopoiesis and 1% plasma cells.  09/14/2019 PET/CT Whole Body Scan ([id number]) which revealed 1. There widespread tiny lytic lesions compatible with multiple myeloma. Index larger lesions are generally similar to the prior exam, with low-grade activity such as the left T9 vertebral body lesion with maximum SUV 4.5. Is mild increase in the activity associated with a mildly sclerotic left proximal humeral lesion, maximum SUV 4.8 (previously 3.5). 2. At the site of the prior left lower lobe nodule is currently more bandlike thickening, with maximum SUV only 1.9, probably benign, continued surveillance of this region suggested. 3. There several small but hypermetabolic lymph nodes. This includes a left parapharyngeal space node measuring 1.0 cm with maximum SUV 12.3 (stable); a left level IB lymph node measuring 0.5 cm with maximum SUV 4.8 (slightly larger than prior); and a left inguinal lymph node measuring 0.7 cm in short axis with maximum SUV 6.4 (previously 0.5 cm with maximum SUV 0.6). Significance of these lymph nodes uncertain, surveillance is recommended. 4. New 5 mm left lower lobe subpleural nodule on image 32/8, not appreciably hypermetabolic, surveillance suggested. 5. Focal subcutaneous stranding along the  left perineum measuring about 2.6 by 1.1 cm on image 221/4, maximum SUV 12.5. This was not present previously and is most likely inflammatory, although given the notable SUV, surveillance of this region is suggested. 6. Other imaging findings of potential clinical significance: Aortic Atherosclerosis (ICD10-I70.0). Coronary atherosclerosis. Old granulomatous disease. Mild right hydronephrosis due to a 7 mm right UPJ calculus. 2 mm right kidney upper pole nonobstructive renal calculus. Prominent stool throughout the colon favors constipation.  12/19/2019 Thoracic & Lumbar Spine MRI ([id number]) ([id number]) revealed Suspected myeloma lesions at T9 and S1. No compression deformity or epidural disease.  01/18/2020 PET/CT ([id number]) which revealed 1. Stable lytic lesions throughout the skeleton. The larger lytic lesions which had mild metabolic activity on comparison exam now have background metabolic activity. No evidence of active myeloma. No evidence of progression multiple myeloma.  No plasmacytoma 3. Hypermetabolic nodules in the LEFT neck may be associated deep  tissues of the LEFT parotid gland. 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  02/16/2023 Bone Marrow biopsy       2. 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  01/17/19 MRI Liver revealed Several appreciable liver lesions all have benign imaging characteristics. No MRI findings of metastatic involvement of the liver. 2. Scattered bony lesions corresponding to the lytic lesions seen at PET-CT, compatible with active myeloma. 3. Aortic Atherosclerosis.  Mild cardiomegaly. 4. Diffuse hepatic steatosis.   3. Left lower lobe pulmonary nodule First seen on 06/01/19 PET/CT PET/CT 08/27/2020: No hypermetabolic mediastinal or hilar nodes. No suspicious pulmonary nodules on the CT scan.  4.  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 Being managed conservatively as per patient's preference.  No symptoms from this currently.   PLAN: -Due for cycle 20, Day 15 of Carfilzomib  today -Reviewed labs from today were reviewed *** -continue maintenance Carfilzomib  and Revlimid  without any dose modifications.   FOLLOW UP: Per integrated scheduling  All of the patient's questions were answered with apparent satisfaction. The patient knows to call the clinic with any problems, questions or concerns.  I have spent a total of 30 minutes minutes of face-to-face and non-face-to-face time, preparing to see the patient, performing a medically appropriate examination, counseling and educating the patient, documenting clinical information in the electronic health record,and care coordination.   Johnston Police PA-C Dept of Hematology and Oncology [facility] Phone: [phone]

## 2024-08-25 ENCOUNTER — Inpatient Hospital Stay

## 2024-08-25 ENCOUNTER — Inpatient Hospital Stay: Attending: Hematology

## 2024-08-25 ENCOUNTER — Inpatient Hospital Stay: Admitting: Physician Assistant

## 2024-08-25 VITALS — BP 112/58 | HR 62 | Temp 98.2°F | Resp 18 | Wt 155.5 lb

## 2024-08-25 DIAGNOSIS — C9000 Multiple myeloma not having achieved remission: Secondary | ICD-10-CM | POA: Insufficient documentation

## 2024-08-25 DIAGNOSIS — Z7189 Other specified counseling: Secondary | ICD-10-CM

## 2024-08-25 DIAGNOSIS — Z5112 Encounter for antineoplastic immunotherapy: Secondary | ICD-10-CM | POA: Diagnosis present

## 2024-08-25 DIAGNOSIS — Z79899 Other long term (current) drug therapy: Secondary | ICD-10-CM | POA: Diagnosis not present

## 2024-08-25 LAB — CBC WITH DIFFERENTIAL (CANCER CENTER ONLY)
Abs Immature Granulocytes: 0.01 K/uL (ref 0.00–0.07)
Basophils Absolute: 0.1 K/uL (ref 0.0–0.1)
Basophils Relative: 2 %
Eosinophils Absolute: 0.1 K/uL (ref 0.0–0.5)
Eosinophils Relative: 2 %
HCT: 37.5 % (ref 36.0–46.0)
Hemoglobin: 12.5 g/dL (ref 12.0–15.0)
Immature Granulocytes: 0 %
Lymphocytes Relative: 23 %
Lymphs Abs: 0.9 K/uL (ref 0.7–4.0)
MCH: 33.3 pg (ref 26.0–34.0)
MCHC: 33.3 g/dL (ref 30.0–36.0)
MCV: 100 fL (ref 80.0–100.0)
Monocytes Absolute: 1 K/uL (ref 0.1–1.0)
Monocytes Relative: 25 %
Neutro Abs: 2 K/uL (ref 1.7–7.7)
Neutrophils Relative %: 48 %
Platelet Count: 167 K/uL (ref 150–400)
RBC: 3.75 MIL/uL — ABNORMAL LOW (ref 3.87–5.11)
RDW: 13.3 % (ref 11.5–15.5)
WBC Count: 4 K/uL (ref 4.0–10.5)
nRBC: 0 % (ref 0.0–0.2)

## 2024-08-25 LAB — CMP (CANCER CENTER ONLY)
ALT: 11 U/L (ref 0–44)
AST: 11 U/L — ABNORMAL LOW (ref 15–41)
Albumin: 3.8 g/dL (ref 3.5–5.0)
Alkaline Phosphatase: 85 U/L (ref 38–126)
Anion gap: 8 (ref 5–15)
BUN: 13 mg/dL (ref 8–23)
CO2: 24 mmol/L (ref 22–32)
Calcium: 9.5 mg/dL (ref 8.9–10.3)
Chloride: 108 mmol/L (ref 98–111)
Creatinine: 0.86 mg/dL (ref 0.44–1.00)
GFR, Estimated: >60 mL/min (ref >60)
Glucose, Bld: 244 mg/dL — ABNORMAL HIGH (ref 70–99)
Potassium: 4 mmol/L (ref 3.5–5.1)
Sodium: 140 mmol/L (ref 135–145)
Total Bilirubin: 0.7 mg/dL (ref 0.0–1.2)
Total Protein: 5.9 g/dL — ABNORMAL LOW (ref 6.5–8.1)

## 2024-08-25 MED ORDER — FAMOTIDINE IN NACL 20-0.9 MG/50ML-% IV SOLN
20.0000 mg | Freq: Once | INTRAVENOUS | Status: AC
  Administered 2024-08-25: 20 mg via INTRAVENOUS
  Filled 2024-08-25: qty 50

## 2024-08-25 MED ORDER — ACETAMINOPHEN 325 MG PO TABS
650.0000 mg | ORAL_TABLET | Freq: Once | ORAL | Status: AC
  Administered 2024-08-25: 650 mg via ORAL
  Filled 2024-08-25: qty 2

## 2024-08-25 MED ORDER — DIPHENHYDRAMINE HCL 25 MG PO CAPS
25.0000 mg | ORAL_CAPSULE | Freq: Once | ORAL | Status: AC
  Administered 2024-08-25: 25 mg via ORAL
  Filled 2024-08-25: qty 1

## 2024-08-25 MED ORDER — PROCHLORPERAZINE MALEATE 10 MG PO TABS
10.0000 mg | ORAL_TABLET | Freq: Once | ORAL | Status: AC
  Administered 2024-08-25: 10 mg via ORAL
  Filled 2024-08-25: qty 1

## 2024-08-25 MED ORDER — DEXTROSE 5 % IV SOLN
36.0000 mg/m2 | Freq: Once | INTRAVENOUS | Status: AC
  Administered 2024-08-25: 60 mg via INTRAVENOUS
  Filled 2024-08-25: qty 30

## 2024-08-25 MED ORDER — SODIUM CHLORIDE 0.9 % IV SOLN: Freq: Once | INTRAVENOUS | Status: AC

## 2024-08-25 MED ORDER — DEXAMETHASONE 4 MG PO TABS
8.0000 mg | ORAL_TABLET | Freq: Once | ORAL | Status: AC
  Administered 2024-08-25: 8 mg via ORAL
  Filled 2024-08-25: qty 2

## 2024-08-25 NOTE — Patient Instructions (Signed)
 [facility] - A DEPT OF [facility]  Discharge Instructions: Thank you for choosing [facility] to provide your oncology and hematology care.   If you have a lab appointment with the Cancer Center, please go directly to the Cancer Center and check in at the registration area.   Wear comfortable clothing and clothing appropriate for easy access to any Portacath or PICC line.   We strive to give you quality time with your provider. You may need to reschedule your appointment if you arrive late (15 or more minutes).  Arriving late affects you and other patients whose appointments are after yours.  Also, if you miss three or more appointments without notifying the office, you may be dismissed from the clinic at the provider's discretion.      For prescription refill requests, have your pharmacy contact our office and allow 72 hours for refills to be completed.    Today you received the following chemotherapy and/or immunotherapy agent: Carfilzomib (Kyprolis)      To help prevent nausea and vomiting after your treatment, we encourage you to take your nausea medication as directed.  BELOW ARE SYMPTOMS THAT SHOULD BE REPORTED IMMEDIATELY: *FEVER GREATER THAN 100.4 F (38 C) OR HIGHER *CHILLS OR SWEATING *NAUSEA AND VOMITING THAT IS NOT CONTROLLED WITH YOUR NAUSEA MEDICATION *UNUSUAL SHORTNESS OF BREATH *UNUSUAL BRUISING OR BLEEDING *URINARY PROBLEMS (pain or burning when urinating, or frequent urination) *BOWEL PROBLEMS (unusual diarrhea, constipation, pain near the anus) TENDERNESS IN MOUTH AND THROAT WITH OR WITHOUT PRESENCE OF ULCERS (sore throat, sores in mouth, or a toothache) UNUSUAL RASH, SWELLING OR PAIN  UNUSUAL VAGINAL DISCHARGE OR ITCHING   Items with * indicate a potential emergency and should be followed up as soon as possible or go to the Emergency Department if any problems should occur.  Please show the CHEMOTHERAPY ALERT CARD or  IMMUNOTHERAPY ALERT CARD at check-in to the Emergency Department and triage nurse.  Should you have questions after your visit or need to cancel or reschedule your appointment, please contact [facility]  Dept: [phone]  and follow the prompts.  Office hours are 8:00 a.m. to 4:30 p.m. Monday - Friday. Please note that voicemails left after 4:00 p.m. may not be returned until the following business day.  We are closed weekends and major holidays. You have access to a nurse at all times for urgent questions. Please call the main number to the clinic Dept: [phone] and follow the prompts.   For any non-urgent questions, you may also contact your provider using MyChart. We now offer e-Visits for anyone 81 and older to request care online for non-urgent symptoms. For details visit [email or web address].   Also download the MyChart app! Go to the app store, search "MyChart", open the app, select [place], and log in with your MyChart username and password.

## 2024-09-03 ENCOUNTER — Encounter: Payer: Self-pay | Admitting: Hematology

## 2024-09-03 NOTE — Progress Notes (Signed)
 This encounter was created in error - please disregard.

## 2024-09-06 ENCOUNTER — Encounter: Payer: Self-pay | Admitting: Hematology

## 2024-09-07 ENCOUNTER — Other Ambulatory Visit

## 2024-09-07 ENCOUNTER — Inpatient Hospital Stay

## 2024-09-07 ENCOUNTER — Ambulatory Visit: Admitting: Hematology

## 2024-09-07 ENCOUNTER — Inpatient Hospital Stay: Admitting: Hematology

## 2024-09-07 ENCOUNTER — Other Ambulatory Visit: Payer: Self-pay

## 2024-09-07 VITALS — BP 118/57 | HR 68 | Temp 97.4°F | Resp 11 | Wt 153.0 lb

## 2024-09-07 DIAGNOSIS — Z7189 Other specified counseling: Secondary | ICD-10-CM

## 2024-09-07 DIAGNOSIS — C9000 Multiple myeloma not having achieved remission: Secondary | ICD-10-CM

## 2024-09-07 DIAGNOSIS — Z5111 Encounter for antineoplastic chemotherapy: Secondary | ICD-10-CM

## 2024-09-07 DIAGNOSIS — Z79899 Other long term (current) drug therapy: Secondary | ICD-10-CM | POA: Diagnosis not present

## 2024-09-07 DIAGNOSIS — Z5112 Encounter for antineoplastic immunotherapy: Secondary | ICD-10-CM | POA: Diagnosis not present

## 2024-09-07 LAB — CBC WITH DIFFERENTIAL (CANCER CENTER ONLY)
Abs Immature Granulocytes: 0.02 K/uL (ref 0.00–0.07)
Basophils Absolute: 0.1 K/uL (ref 0.0–0.1)
Basophils Relative: 1 %
Eosinophils Absolute: 0.1 K/uL (ref 0.0–0.5)
Eosinophils Relative: 3 %
HCT: 37 % (ref 36.0–46.0)
Hemoglobin: 12.6 g/dL (ref 12.0–15.0)
Immature Granulocytes: 0 %
Lymphocytes Relative: 19 %
Lymphs Abs: 0.9 K/uL (ref 0.7–4.0)
MCH: 33.6 pg (ref 26.0–34.0)
MCHC: 34.1 g/dL (ref 30.0–36.0)
MCV: 98.7 fL (ref 80.0–100.0)
Monocytes Absolute: 1 K/uL (ref 0.1–1.0)
Monocytes Relative: 21 %
Neutro Abs: 2.8 K/uL (ref 1.7–7.7)
Neutrophils Relative %: 56 %
Platelet Count: 142 K/uL — ABNORMAL LOW (ref 150–400)
RBC: 3.75 MIL/uL — ABNORMAL LOW (ref 3.87–5.11)
RDW: 13.6 % (ref 11.5–15.5)
WBC Count: 4.9 K/uL (ref 4.0–10.5)
nRBC: 0 % (ref 0.0–0.2)

## 2024-09-07 LAB — CMP (CANCER CENTER ONLY)
ALT: 12 U/L (ref 0–44)
AST: 11 U/L — ABNORMAL LOW (ref 15–41)
Albumin: 3.8 g/dL (ref 3.5–5.0)
Alkaline Phosphatase: 86 U/L (ref 38–126)
Anion gap: 5 (ref 5–15)
BUN: 12 mg/dL (ref 8–23)
CO2: 26 mmol/L (ref 22–32)
Calcium: 9.4 mg/dL (ref 8.9–10.3)
Chloride: 110 mmol/L (ref 98–111)
Creatinine: 0.91 mg/dL (ref 0.44–1.00)
GFR, Estimated: >60 mL/min (ref >60)
Glucose, Bld: 220 mg/dL — ABNORMAL HIGH (ref 70–99)
Potassium: 4 mmol/L (ref 3.5–5.1)
Sodium: 141 mmol/L (ref 135–145)
Total Bilirubin: 0.6 mg/dL (ref 0.0–1.2)
Total Protein: 5.7 g/dL — ABNORMAL LOW (ref 6.5–8.1)

## 2024-09-07 MED ORDER — DEXTROSE 5 % IV SOLN
36.0000 mg/m2 | Freq: Once | INTRAVENOUS | Status: AC
  Administered 2024-09-07: 60 mg via INTRAVENOUS
  Filled 2024-09-07: qty 30

## 2024-09-07 MED ORDER — SODIUM CHLORIDE 0.9 % IV SOLN: Freq: Once | INTRAVENOUS | Status: AC

## 2024-09-07 MED ORDER — DEXAMETHASONE 4 MG PO TABS
8.0000 mg | ORAL_TABLET | Freq: Once | ORAL | Status: AC
  Administered 2024-09-07: 8 mg via ORAL
  Filled 2024-09-07: qty 2

## 2024-09-07 MED ORDER — DIPHENHYDRAMINE HCL 25 MG PO CAPS
25.0000 mg | ORAL_CAPSULE | Freq: Once | ORAL | Status: AC
  Administered 2024-09-07: 25 mg via ORAL
  Filled 2024-09-07: qty 1

## 2024-09-07 MED ORDER — PROCHLORPERAZINE MALEATE 10 MG PO TABS
10.0000 mg | ORAL_TABLET | Freq: Once | ORAL | Status: AC
  Administered 2024-09-07: 10 mg via ORAL
  Filled 2024-09-07: qty 1

## 2024-09-07 MED ORDER — SODIUM CHLORIDE 0.9% FLUSH: 10.0000 mL | INTRAVENOUS | Status: DC | PRN

## 2024-09-07 MED ORDER — FAMOTIDINE IN NACL 20-0.9 MG/50ML-% IV SOLN
20.0000 mg | Freq: Once | INTRAVENOUS | Status: AC
  Administered 2024-09-07: 20 mg via INTRAVENOUS
  Filled 2024-09-07: qty 50

## 2024-09-07 MED ORDER — ACETAMINOPHEN 325 MG PO TABS
650.0000 mg | ORAL_TABLET | Freq: Once | ORAL | Status: AC
  Administered 2024-09-07: 650 mg via ORAL
  Filled 2024-09-07: qty 2

## 2024-09-07 NOTE — Patient Instructions (Signed)
 [facility] - A DEPT OF [facility]  Discharge Instructions: Thank you for choosing [facility] to provide your oncology and hematology care.   If you have a lab appointment with the Cancer Center, please go directly to the Cancer Center and check in at the registration area.   Wear comfortable clothing and clothing appropriate for easy access to any Portacath or PICC line.   We strive to give you quality time with your provider. You may need to reschedule your appointment if you arrive late (15 or more minutes).  Arriving late affects you and other patients whose appointments are after yours.  Also, if you miss three or more appointments without notifying the office, you may be dismissed from the clinic at the provider's discretion.      For prescription refill requests, have your pharmacy contact our office and allow 72 hours for refills to be completed.    Today you received the following chemotherapy and/or immunotherapy agent: Carfilzomib (Kyprolis)      To help prevent nausea and vomiting after your treatment, we encourage you to take your nausea medication as directed.  BELOW ARE SYMPTOMS THAT SHOULD BE REPORTED IMMEDIATELY: *FEVER GREATER THAN 100.4 F (38 C) OR HIGHER *CHILLS OR SWEATING *NAUSEA AND VOMITING THAT IS NOT CONTROLLED WITH YOUR NAUSEA MEDICATION *UNUSUAL SHORTNESS OF BREATH *UNUSUAL BRUISING OR BLEEDING *URINARY PROBLEMS (pain or burning when urinating, or frequent urination) *BOWEL PROBLEMS (unusual diarrhea, constipation, pain near the anus) TENDERNESS IN MOUTH AND THROAT WITH OR WITHOUT PRESENCE OF ULCERS (sore throat, sores in mouth, or a toothache) UNUSUAL RASH, SWELLING OR PAIN  UNUSUAL VAGINAL DISCHARGE OR ITCHING   Items with * indicate a potential emergency and should be followed up as soon as possible or go to the Emergency Department if any problems should occur.  Please show the CHEMOTHERAPY ALERT CARD or  IMMUNOTHERAPY ALERT CARD at check-in to the Emergency Department and triage nurse.  Should you have questions after your visit or need to cancel or reschedule your appointment, please contact [facility]  Dept: [phone]  and follow the prompts.  Office hours are 8:00 a.m. to 4:30 p.m. Monday - Friday. Please note that voicemails left after 4:00 p.m. may not be returned until the following business day.  We are closed weekends and major holidays. You have access to a nurse at all times for urgent questions. Please call the main number to the clinic Dept: [phone] and follow the prompts.   For any non-urgent questions, you may also contact your provider using MyChart. We now offer e-Visits for anyone 81 and older to request care online for non-urgent symptoms. For details visit [email or web address].   Also download the MyChart app! Go to the app store, search "MyChart", open the app, select [place], and log in with your MyChart username and password.

## 2024-09-07 NOTE — Progress Notes (Signed)
 HEMATOLOGY ONCOLOGY PROGRESS NOTE  Date of service: 09/07/2024  Patient Care Team: Rollene Almarie LABOR, MD as PCP - General (Internal Medicine) Obie Princella HERO, MD (Inactive) as Consulting Physician (Gastroenterology) Cleotilde Ronal RAMAN, MD as Consulting Physician (Gynecology) Sheril Coy, MD as Consulting Physician (Orthopedic Surgery) Neysa Reggy BIRCH, MD as Consulting Physician (Pulmonary Disease) Cleatus Collar, MD (Ophthalmology)  CHIEF COMPLAINTS/PURPOSE OF VISIT:  Follow-up for evaluation and management of multiple myeloma  HISTORY OF PRESENTING ILLNESS:  Faith Orr is a wonderful 83 y.o. female who has been referred to us  by Dr. Almarie Rollene for evaluation and management of Lytic bone lesions. She is accompanied today by her son in law, and her partner is present via phone. The pt reports that she is doing well overall.    The pt notes that 2-3 months ago while standing at the stove, and otherwise feeling normally, she felt something snap that took her breath away in her mid back as she stretched to get something. The pt notes that she saw a chiropractor twice due to her back stiffness, which did not help. The pt then described her new bone pains to her PCP on 12/05/18, and subsequent imaging, as noted below, revealed concerns for numerous bone lesions. She has begun 70mg  Fosamax . The pt notes that she had hip pain in 2018, and that an XR at that time did not reveal any lesions.   The pt notes that most of her pain is concentrated to her left shoulder presently, and with minimal movement of the arm. She endorses pain radiating into her left arm and notes that her hand is swollen in the mornings when she wakes up. She also endorses present back pain and right hip pain, worse when she walks. She has not yet seen orthopedics. The pt reports that she is needing to take 600mg  Advil  every 4-6 hours as Tramadol  alone has not been able to alleviate her pain. The pt denies any  unexpected weight loss, fevers, chills, or night sweats. The pt notes that her urine is a very dark color presently, but denies overt blood in the urine, underpants, nor tissue paper. The pt notes that in the last two weeks she has had some soreness in her head, but denies new headaches or changes in vision. The pt notes that she has been urinating more frequently overall, but has been trying to stay better hydrated as well.   The pt notes that she has been compliant with annual mammograms.    The pt notes that she had a cyst in her right breast which was removed in the past. She fractured her left wrist in 2008 after falling down stairs. The pt endorses history of fatty liver.    The pt denies ever smoking cigarettes and endorses significant second hand smoke exposure with a previous marriage.    Of note prior to the patient's visit today, pt has had a Bone Scan completed on 12/13/18 with results revealing Multifocal uptake throughout the skeleton, consistent with diffuse metastatic disease. Primary tumor is not specified. 2. Uptake in the proximal right femur, consistent with lytic lesions. 3. Uptake in the ribs bilaterally as described. 4. Lesions in the proximal left humerus. 5. Diffuse uptake throughout the skull consistent with metastatic disease. 6. Right paramedian uptake at the manubrium.   Most recent lab results (12/08/18) of CBC w/diff and CMP is as follows: all values are WNL except for Glucose at 279, BUN at 24, AST at 41, ALT at 46.  12/08/18 SPEP revealed all values WNL except for Total Protein at 6.0, Albumin at 3.6, Gamma globulin at 0.7, and M spike at 0.5g   On review of systems, pt reports significant left shoulder pain, back pain, right hip pain, dark urine, and denies fevers, chills, night sweats, unexpected weight loss, changes in bowel habits, changes in breathing, cough, new respiratory symptoms, changes in vision, abdominal pains, leg swelling, and any other symptoms.    On  PMHx the pt reports fatty liver, and denies blood clots.  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 Denies ever smoking.  On Family Hx the pt reports maternal grandmother with colon cancer. Father with bladder cancer and amyloidosis (pt notes that this could have been misdiagnosis). Mother with Protein S deficiency and polymyalgia rheumatica.  Current Treatment: Patient is on Treatment Plan :  MYELOMA MAINTENANCE Carfilzomib  (70) + Lenalidomide  q28d    SUMMARY OF ONCOLOGIC HISTORY: Oncology History  Multiple myeloma (HCC)  01/16/2019 Initial Diagnosis   Multiple myeloma (HCC)   01/18/2019 - 01/25/2019 Chemotherapy   The patient had bortezomib  SQ (VELCADE ) chemo injection 2.5 mg, 1.3 mg/m2 = 2.5 mg, Subcutaneous,  Once, 1 of 4 cycles Administration: 2.5 mg (01/18/2019), 2.5 mg (01/25/2019)  for chemotherapy treatment.    02/02/2019 - 02/12/2023 Chemotherapy   Patient is on Treatment Plan : MYELOMA SALVAGE Carfilzomib  / Dexamethasone  q28d     02/26/2023 -  Chemotherapy   Patient is on Treatment Plan : MYELOMA MAINTENANCE Carfilzomib  (70) + Lenalidomide  q28d       INTERVAL HISTORY:  Faith Orr is a 83 y.o.female being seen today for continued evaluation and management of multiple myeloma.    She was last seen by me 06/30/2024 and reported her pain is not manageable when she is not taking Oxycodone , and that she additionally takes Tylenol  in between. Did endorse that her back pain had significantly improved from prior visit. Otherwise was tolerating treatment well with no new or major toxicities, denied any other medication changes, as well as symptoms of infection issues, new bone pain, fever, chills, night sweats, pain or swelling at the port. Did note manageable fatigue as well as abdominal pain w/ bowel movements which she was taking 1-2 tabs Senna PRN for constipation   Today, she says that at night she is experiencing a feeling fullness in  her legs/feet though there is no swelling or tingling. This occurs 2x/ week, and is noticeably exasperated after she's been on her feet. Associated with this feeling is coldness in extremities, but no color change - mild symptoms in hands bilaterally. This abnormal sensation is affecting her sleep as it does wake her up.  She notes that she is intermittently compliant with her OTC Vitamin B12.  Does not have Gabapentin  to take to settle restless legs - was prescribed 100 mg BID #60 tablets in January, but no refills have been made.  Regarding coldness is hands, she denies personal hx of Carpal Tunnel Syndrome.  Positively, she expresses that her pain back is improved and she would like to try discontinuing her Oxycodone , which she takes 10 mg BID - 1 in the morning and 1 at night for pain management. Other bothersome pain she is having is right shoulder pain that is positional and action specific, such as when she is reaching for something.  She also endorses significantly less constipation, though has had an increase in diarrhea since last month.  Additionally mentions experiencing  increased nausea in general that does not correspond with her treatment schedule. Her Protonix  does mildly relieve this, and she also uses her Zofran  as needed.  Overall, she says that she feels like she is improving. Has had more energy, and as such has been cleaning out her house and enjoying canning and pickling.  REVIEW OF SYSTEMS:    10 Point review of systems of done and is negative except as noted above.  . Past Medical History:  Diagnosis Date   Allergy    seasonal   Asthma    DEPRESSION    DIABETES MELLITUS, TYPE II    Diverticulosis    HYPERLIPIDEMIA    Macular degeneration of left eye    mild, Dr.Hecker   Obesity, unspecified    Osteoarthritis of both knees    OSTEOPENIA    Osteopenia    URINARY INCONTINENCE     . Past Surgical History:  Procedure Laterality Date   CATARACT EXTRACTION  Left 05/24/2018   CESAREAN SECTION  01/1973   CYSTOSCOPY/URETEROSCOPY/HOLMIUM LASER/STENT PLACEMENT Right 09/20/2019   Procedure: CYSTOSCOPY/URETEROSCOPY/HOLMIUM LASER/STENT PLACEMENT;  Surgeon: Carolee Sherwood JONETTA DOUGLAS, MD;  Location: [facility];  Service: Urology;  Laterality: Right;   FRACTURE SURGERY     IR BONE MARROW BIOPSY & ASPIRATION  02/16/2023   IR IMAGING GUIDED PORT INSERTION  02/20/2019   left wrist surgery  2008   By Dr. Lilly   right ankle  1994    . Social History   Tobacco Use   Smoking status: Never   Smokeless tobacco: Never   Tobacco comments:    Lives with partner Charlann Irving) and son  Vaping Use   Vaping status: Never Used  Substance Use Topics   Alcohol use: No    Alcohol/week: 0.0 standard drinks of alcohol   Drug use: No    ALLERGIES:  is allergic to levofloxacin , penicillins, aleve [naproxen sodium], and sulfonamide derivatives.  MEDICATIONS:  Current Outpatient Medications  Medication Sig Dispense Refill   acyclovir  (ZOVIRAX ) 400 MG tablet TAKE 1 TABLET(400 MG) BY MOUTH TWICE DAILY 60 tablet 5   Blood Glucose Monitoring Suppl (FREESTYLE FREEDOM LITE) W/DEVICE KIT Use to check blood sugars twice a day Dx 250.00 1 each 0   Calcium  Carbonate-Vitamin D  600-400 MG-UNIT tablet Take 1 tablet by mouth 2 (two) times daily.     dexamethasone  (DECADRON ) 4 MG tablet Take 5 tablets (20 mg total) by mouth once a week. On D22 of each cycle of treatment 20 tablet 5   ELIQUIS  2.5 MG TABS tablet TAKE 1 TABLET(2.5 MG) BY MOUTH TWICE DAILY 60 tablet 2   fentaNYL  (DURAGESIC ) 12 MCG/HR Place 1 patch onto the skin every 3 (three) days. (Patient not taking: Reported on 09/12/2024) 15 patch 0   fluticasone  (FLONASE ) 50 MCG/ACT nasal spray Place 1 spray into both nostrils daily. 48 g 3   gabapentin  (NEURONTIN ) 100 MG capsule Take 1 capsule (100 mg total) by mouth 2 (two) times daily. (Patient not taking: Reported on 09/12/2024) 60 capsule 0   glucose blood  (FREESTYLE LITE) test strip CHECK BLOOD SUGAR TWICE DAILY AS DIRECTED Dx 250.00 180 each 3   Lancets (FREESTYLE) lancets Use twice daily to check sugars. 100 each 11   lenalidomide  (REVLIMID ) 5 MG capsule Take 1 capsule (5 mg total) by mouth daily for 21 days. Take 7 days off. Repeat cycle 21 capsule 0   lidocaine  (LIDODERM ) 5 % Place 1 patch onto the skin daily. Remove & Discard  patch within 12 hours or as directed by MD 30 patch 0   lidocaine -prilocaine  (EMLA ) cream APPLY 1 APPLICATION TO THE AFFECTED AREA AS NEEDED. USE PRIOR TO PORT ACCESS (Patient not taking: Reported on 09/12/2024) 30 g 0   meclizine  (ANTIVERT ) 25 MG tablet Take 1 tablet (25 mg total) by mouth every 8 (eight) hours as needed for dizziness. 20 tablet 0   metFORMIN  (GLUCOPHAGE -XR) 500 MG 24 hr tablet TAKE 1 TABLET(500 MG) BY MOUTH TWICE DAILY WITH A MEAL 180 tablet 3   metroNIDAZOLE  (METROGEL ) 1 % gel Apply topically in the morning and at bedtime. 60 g 1   Multiple Vitamins-Minerals (ICAPS) CAPS Take 1 capsule by mouth daily after breakfast.     ondansetron  (ZOFRAN ) 8 MG tablet TAKE 1 TABLET(8 MG) BY MOUTH TWICE DAILY AS NEEDED FOR NAUSEA OR VOMITING 30 tablet 1   Oxycodone  HCl 10 MG TABS Take 1 tablet (10 mg total) by mouth every 6 (six) hours as needed. 90 tablet 0   pantoprazole  (PROTONIX ) 20 MG tablet TAKE 1 TABLET(20 MG) BY MOUTH DAILY 30 tablet 2   polyethylene glycol (MIRALAX / GLYCOLAX) packet Take 17 g by mouth daily after breakfast.     potassium chloride  SA (KLOR-CON  M) 20 MEQ tablet TAKE 2 TABLETS(40 MEQ) BY MOUTH TWICE DAILY 360 tablet 1   prochlorperazine  (COMPAZINE ) 10 MG tablet Take 1 tablet (10 mg total) by mouth every 6 (six) hours as needed (Nausea or vomiting). (Patient not taking: Reported on 09/12/2024) 30 tablet 1   senna-docusate (SENNA S) 8.6-50 MG tablet Take 2 tablets by mouth at bedtime. 60 tablet 2   sertraline  (ZOLOFT ) 100 MG tablet TAKE 1 TABLET(100 MG) BY MOUTH DAILY 90 tablet 3   simvastatin   (ZOCOR ) 20 MG tablet TAKE 1 TABLET(20 MG) BY MOUTH DAILY AT 6 PM 90 tablet 3   tizanidine  (ZANAFLEX ) 2 MG capsule Take 1 capsule (2 mg total) by mouth in the morning and at bedtime. (Patient not taking: Reported on 09/12/2024) 20 capsule 0   Vitamin D , Ergocalciferol , (DRISDOL ) 1.25 MG (50000 UNIT) CAPS capsule TAKE 1 CAPSULE BY MOUTH EVERY 7 DAYS 12 capsule 0   No current facility-administered medications for this visit.   Facility-Administered Medications Ordered in Other Visits  Medication Dose Route Frequency Provider Last Rate Last Admin   heparin  lock flush 100 unit/mL  500 Units Intracatheter Once PRN Samariah Hokenson Kishore, MD       sodium chloride  flush (NS) 0.9 % injection 10 mL  10 mL Intracatheter PRN Onesimo Emaline Brink, MD   10 mL at 12/17/23 1336    PHYSICAL EXAMINATION: ECOG PERFORMANCE STATUS: 1 - Symptomatic but completely ambulatory VSS GENERAL:alert, in no acute distress and comfortable SKIN: no acute rashes, no significant lesions EYES: conjunctiva are pink and non-injected, sclera anicteric OROPHARYNX: MMM, no exudates, no oropharyngeal erythema or ulceration NECK: supple, no JVD LYMPH:  no palpable lymphadenopathy in the cervical, axillary or inguinal regions LUNGS: clear to auscultation b/l with normal respiratory effort HEART: regular rate & rhythm ABDOMEN:  normoactive bowel sounds , non tender, not distended. Extremity: no pedal edema PSYCH: alert & oriented x 3 with fluent speech NEURO: no focal motor/sensory deficits  LABORATORY DATA:   I have reviewed the data as listed      Latest Ref Rng & Units 09/07/2024    1:17 PM 08/25/2024   10:33 AM 08/10/2024    1:20 PM  CBC  WBC 4.0 - 10.5 K/uL 4.9  4.0  5.5  Hemoglobin 12.0 - 15.0 g/dL 87.3  87.4  86.8   Hematocrit 36.0 - 46.0 % 37.0  37.5  38.8   Platelets 150 - 400 K/uL 142  167  169        Latest Ref Rng & Units 09/07/2024    1:17 PM 08/25/2024   10:33 AM 08/10/2024    1:20 PM  CMP  Glucose 70 -  99 mg/dL 779  755  730   BUN 8 - 23 mg/dL 12  13  15    Creatinine 0.44 - 1.00 mg/dL 9.08  9.13  9.05   Sodium 135 - 145 mmol/L 141  140  138   Potassium 3.5 - 5.1 mmol/L 4.0  4.0  3.9   Chloride 98 - 111 mmol/L 110  108  107   CO2 22 - 32 mmol/L 26  24  25    Calcium  8.9 - 10.3 mg/dL 9.4  9.5  9.6   Total Protein 6.5 - 8.1 g/dL 5.7  5.9  5.8   Total Bilirubin 0.0 - 1.2 mg/dL 0.6  0.7  0.7   Alkaline Phos 38 - 126 U/L 86  85  88   AST 15 - 41 U/L 11  11  12    ALT 0 - 44 U/L 12  11  13     09/18/2019 BM Bx Report ([id number])   09/18/2019 FISH Panel    05/30/2019 BM Bx   01/06/2019 BM Bx:     01/06/19 Cytogenetics:      05/30/19 BM Biopsy:   09/18/2019 FISH Panel    09/18/2019 BM Surgical Pathology ([id number])     RADIOGRAPHIC STUDIES: I have personally reviewed the radiological images as listed and agreed with the findings in the report. No results found.    ASSESSMENT & PLAN:   83 y.o. female with  1.  Nonsecretory multiple Myeloma, RISS Stage III  Labs upon initial presentation from 12/08/18, blood counts are normal including WBC at 7.1k, HGB at 13.1, and PLT at 245k. Calcium  normal at 10.3. Creatinine normal at 0.63. M spike at 0.5g. 12/13/18 Bone Scan revealed Multifocal uptake throughout the skeleton, consistent with diffuse metastatic disease. Primary tumor is not specified. 2. Uptake in the proximal right femur, consistent with lytic lesions. 3. Uptake in the ribs bilaterally as described. 4. Lesions in the proximal left humerus. 5. Diffuse uptake throughout the skull consistent with metastatic disease. 6. Right paramedian uptake at the manubrium.  12/13/18 CT Right Femur revealed Numerous lytic lesions involving the right femur and a lytic lesion in the left inferior pubic ramus. Overall appearance is most concerning for multiple myeloma  12/27/18 Pretreatment 24hour UPEP observed an M spike at 18mg , and showed 199mg  total protein/day.  12/27/18  Pretreatment MMP revealed M Protein at 0.5g with IgG Lambda specificity. Kappa:Lambda light chain ratio at 0.13, with Lambda at 40.3. There is less abnormal protein and light chains than I would expect from 30% plasma cells, which suggests hypo-secretory or non-secretory neoplastic plasma cells. Will have an impact in assessing response. 01/05/19 PET/CT revealed Innumerable lytic lesions in the skeleton compatible with myeloma. Most of the larger lesions are hypermetabolic, for example including a left proximal humeral shaft lesion with maximum SUV of 8.1 and a 2.8 cm lesion in the left T9 vertebral body with maximum SUV 5.1. Most of the smaller lytic lesions, and some of the larger lesions, do not demonstrate accentuated metabolic activity. 2. 1.2 cm in short axis lymph node in the left parapharyngeal space is  hypermetabolic with maximum SUV 11.8. I do not see a separate mass in the head and neck to give rise to this hypermetabolic lymph node. 3. Mosaic attenuation in the lower lobes, nonspecific possibly from air trapping. 4.  Aortic Atherosclerosis 5. Heterogeneous activity in the liver, making it hard to exclude small liver lesions. Consider hepatic protocol MRI with and without contrast for definitive assessment. Nonobstructive right nephrolithiasis. Old granulomatous disease  01/06/19 Bone Marrow biopsy revealed interstitial increase in plasma cells (28% aspirate, 40% CD138 immunohistochemistry). Plasma cells negative for light chains consistent with a non or weakly secretory myeloma   01/06/19 Cytogenetics revealed 37% of cells with trisomy 11 or 11q deletion, and 40.5% of cells with 17p mutation  S/p 5 cycles of KRD treatment  05/31/19 BM Biopsy revealed mild atypical plasmacytosis at 5% with polytypic variation.   06/01/19 PET/CT revealed Dominant lesion in the LEFT humerus is decreased significantly in metabolic activity. Additional hypermetabolic skeletal lytic lesions have decreased in metabolic  activity or similar to comparison exam (01/05/2019). No evidence of disease progression. 2. Multiple additional lytic lesions do not have metabolic activity and unchanged. 3. No new skeletal lesions are identified. No soft tissue plasmacytoma identified. 4. Nodule / node in the LEFT parapharyngeal space which is intensely hypermetabolic not changed from prior. 5. New hypermetabolic LEFT lower lobe pulmonary nodule is indeterminate. Recommend close attention on follow-up 6. New obstructive hydronephrosis of the RIGHT kidney related to RIGHT UPJ stone.  09/18/2019 BM Bx Report which revealed Slightly hypercellular bone marrow for age with trilineage hematopoiesis and 1% plasma cells.  09/14/2019 PET/CT Whole Body Scan ([id number]) which revealed 1. There widespread tiny lytic lesions compatible with multiple myeloma. Index larger lesions are generally similar to the prior exam, with low-grade activity such as the left T9 vertebral body lesion with maximum SUV 4.5. Is mild increase in the activity associated with a mildly sclerotic left proximal humeral lesion, maximum SUV 4.8 (previously 3.5). 2. At the site of the prior left lower lobe nodule is currently more bandlike thickening, with maximum SUV only 1.9, probably benign, continued surveillance of this region suggested. 3. There several small but hypermetabolic lymph nodes. This includes a left parapharyngeal space node measuring 1.0 cm with maximum SUV 12.3 (stable); a left level IB lymph node measuring 0.5 cm with maximum SUV 4.8 (slightly larger than prior); and a left inguinal lymph node measuring 0.7 cm in short axis with maximum SUV 6.4 (previously 0.5 cm with maximum SUV 0.6). Significance of these lymph nodes uncertain, surveillance is recommended. 4. New 5 mm left lower lobe subpleural nodule on image 32/8, not appreciably hypermetabolic, surveillance suggested. 5. Focal subcutaneous stranding along the left perineum measuring about 2.6 by 1.1 cm  on image 221/4, maximum SUV 12.5. This was not present previously and is most likely inflammatory, although given the notable SUV, surveillance of this region is suggested. 6. Other imaging findings of potential clinical significance: Aortic Atherosclerosis (ICD10-I70.0). Coronary atherosclerosis. Old granulomatous disease. Mild right hydronephrosis due to a 7 mm right UPJ calculus. 2 mm right kidney upper pole nonobstructive renal calculus. Prominent stool throughout the colon favors constipation.  12/19/2019 Thoracic & Lumbar Spine MRI ([id number]) ([id number]) revealed Suspected myeloma lesions at T9 and S1. No compression deformity or epidural disease.  01/18/2020 PET/CT ([id number]) which revealed 1. Stable lytic lesions throughout the skeleton. The larger lytic lesions which had mild metabolic activity on comparison exam now have background metabolic activity. No evidence of active myeloma. No evidence of progression  multiple myeloma.  No plasmacytoma 3. Hypermetabolic nodules in the LEFT neck may be associated deep tissues of the LEFT parotid gland. 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  02/16/2023 Bone Marrow biopsy       2. 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  01/17/19 MRI Liver revealed Several appreciable liver lesions all have benign imaging characteristics. No MRI findings of metastatic involvement of the liver. 2. Scattered bony lesions corresponding to the lytic lesions seen at PET-CT, compatible with active myeloma. 3. Aortic Atherosclerosis.  Mild cardiomegaly. 4. Diffuse hepatic steatosis.   3. Left lower lobe pulmonary nodule First seen on 06/01/19 PET/CT PET/CT 08/27/2020: No hypermetabolic mediastinal or hilar nodes. No suspicious pulmonary nodules on the CT scan.  4. 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 Has been stable on last couple of scans. Being managed conservatively as per patient's preference.  No symptoms from this currently.  5. Mild chronic kidney disease CMP done 04/09/2022 showed creatinine of 1.17 and GFR est of 48 consistent with mild chronic kidney disease.  PLAN:  -Discussed lab results on 09/07/2024 in detail with patient: CBC showed WBC of 4.9K << 4.8, hemoglobin of 12.6 << 13.4, though platelets decreased to 142K << 181K; CMP stable.  - On PE today, no enlarged lymph nodes palpated. - Patient tolerating maintenance Carfilzomib  + Revlimid  treatment, denies any new or severe toxicities, and thus she shall continue this treatment.   - Patient noted to have right shoulder pain that is positional and action specific, such as when she is reaching for something so this is likely muscular, but otherwise no new pain outside of her previously known painful areas.   - Do not stop Oxycodone  abruptly, slowly reduce to once daily and then as needed - Can take Tylenol  - Though BMs have increased and become more consistent, continue Senna as needed for constipation. - Re-discussed that non-secretory myeloma cannot be monitored based on protein levels, and there is therefore a need for continued monitoring with PET scans every 6 months; she is due for repeat in about 1 month anyway.   - Informed her that Vitamin B12 deficiency may be contributing to abnormal sensations in extremities, so suggested taking B12 & B-complex, using compression socks, or a weighted blanket to manage restless legs. - Additionally recommended warming extremities to see if warmth improves tingling/numbness - May consider Gabapentin  if symptoms persist, which she has been prescribed in the past but has not had a refill of this for some time.  FOLLOW UP: Plz schedule next 4 cycles of treatment MD visit every 8 weeks Repeat PET scan in ~ 1 month  The total time spent in  the appointment was 32 minutes*.  All of the patient's questions were answered with apparent satisfaction. The patient knows to call the clinic with any problems, questions or concerns.   Emaline Saran MD MS AAHIVMS [facility] Hematology/Oncology Physician [facility]  .*Total Encounter Time as defined by the Centers for Medicare and Medicaid Services includes, in addition to the face-to-face time of a patient visit (documented in the note above) non-face-to-face time: obtaining and reviewing outside history, ordering and reviewing medications, tests or procedures, care coordination (communications with other health care professionals or caregivers) and documentation in the medical record.   I,Emily Lagle,acting as a Neurosurgeon for Emaline Saran, MD.,have documented all relevant documentation  on the behalf of Emaline Saran, MD,as directed by  Emaline Saran, MD while in the presence of Emaline Saran, MD.

## 2024-09-08 ENCOUNTER — Other Ambulatory Visit: Payer: Self-pay | Admitting: Hematology

## 2024-09-08 DIAGNOSIS — C9000 Multiple myeloma not having achieved remission: Secondary | ICD-10-CM

## 2024-09-12 ENCOUNTER — Encounter: Payer: Self-pay | Admitting: Hematology

## 2024-09-12 ENCOUNTER — Ambulatory Visit: Payer: Medicare PPO

## 2024-09-12 VITALS — BP 120/72 | HR 68 | Ht 63.0 in | Wt 152.4 lb

## 2024-09-12 DIAGNOSIS — Z0000 Encounter for general adult medical examination without abnormal findings: Secondary | ICD-10-CM | POA: Diagnosis not present

## 2024-09-12 DIAGNOSIS — Z23 Encounter for immunization: Secondary | ICD-10-CM

## 2024-09-12 NOTE — Progress Notes (Cosign Needed Addendum)
 Subjective:   Aryianna Earwood is a 83 y.o. who presents for a Medicare Wellness preventive visit.  As a reminder, Annual Wellness Visits don't include a physical exam, and some assessments may be limited, especially if this visit is performed virtually. We may recommend an in-person follow-up visit with your provider if needed.  Visit Complete: In person  Persons Participating in Visit: Patient.  AWV Questionnaire: No: Patient Medicare AWV questionnaire was not completed prior to this visit.  Cardiac Risk Factors include: advanced age (>22men, >31 women);diabetes mellitus;dyslipidemia     Objective:    Today's Vitals   09/12/24 1516  BP: 120/72  Pulse: 68  SpO2: 98%  Weight: 152 lb 6.4 oz (69.1 kg)  Height: 5' 3 (1.6 m)   Body mass index is 27 kg/m.     09/12/2024    3:16 PM 04/21/2024    1:37 PM 12/31/2023    1:22 PM 09/09/2023    2:54 PM 07/16/2023    8:37 AM 07/02/2023   11:41 AM 02/26/2023   11:13 AM  Advanced Directives  Does Patient Have a Medical Advance Directive? Yes Yes Yes Yes Yes Yes Yes  Type of Estate agent of [place];Living will  Healthcare Power of [place];Living will Healthcare Power of [place];Living will   Healthcare Power of [place];Living will  Does patient want to make changes to medical advance directive?  No - Patient declined   No - Patient declined No - Patient declined   Copy of Healthcare Power of Attorney in Chart? No - copy requested   No - copy requested   No - copy requested  Would patient like information on creating a medical advance directive?       No - Patient declined    Current Medications (verified) Outpatient Encounter Medications as of 09/12/2024  Medication Sig   acyclovir  (ZOVIRAX ) 400 MG tablet TAKE 1 TABLET(400 MG) BY MOUTH TWICE DAILY   Blood Glucose Monitoring Suppl (FREESTYLE FREEDOM LITE) W/DEVICE KIT Use to check blood sugars twice a day Dx 250.00   Calcium  Carbonate-Vitamin D  600-400 MG-UNIT  tablet Take 1 tablet by mouth 2 (two) times daily.   dexamethasone  (DECADRON ) 4 MG tablet Take 5 tablets (20 mg total) by mouth once a week. On D22 of each cycle of treatment   ELIQUIS  2.5 MG TABS tablet TAKE 1 TABLET(2.5 MG) BY MOUTH TWICE DAILY   fluticasone  (FLONASE ) 50 MCG/ACT nasal spray Place 1 spray into both nostrils daily.   glucose blood (FREESTYLE LITE) test strip CHECK BLOOD SUGAR TWICE DAILY AS DIRECTED Dx 250.00   Lancets (FREESTYLE) lancets Use twice daily to check sugars.   lenalidomide  (REVLIMID ) 5 MG capsule Take 1 capsule (5 mg total) by mouth daily for 21 days. Take 7 days off. Repeat cycle   lidocaine  (LIDODERM ) 5 % Place 1 patch onto the skin daily. Remove & Discard patch within 12 hours or as directed by MD   meclizine  (ANTIVERT ) 25 MG tablet Take 1 tablet (25 mg total) by mouth every 8 (eight) hours as needed for dizziness.   metFORMIN  (GLUCOPHAGE -XR) 500 MG 24 hr tablet TAKE 1 TABLET(500 MG) BY MOUTH TWICE DAILY WITH A MEAL   metroNIDAZOLE  (METROGEL ) 1 % gel Apply topically in the morning and at bedtime.   Multiple Vitamins-Minerals (ICAPS) CAPS Take 1 capsule by mouth daily after breakfast.   ondansetron  (ZOFRAN ) 8 MG tablet TAKE 1 TABLET(8 MG) BY MOUTH TWICE DAILY AS NEEDED FOR NAUSEA OR VOMITING   Oxycodone  HCl  10 MG TABS Take 1 tablet (10 mg total) by mouth every 6 (six) hours as needed.   pantoprazole  (PROTONIX ) 20 MG tablet TAKE 1 TABLET(20 MG) BY MOUTH DAILY   polyethylene glycol (MIRALAX / GLYCOLAX) packet Take 17 g by mouth daily after breakfast.   potassium chloride  SA (KLOR-CON  M) 20 MEQ tablet TAKE 2 TABLETS(40 MEQ) BY MOUTH TWICE DAILY   senna-docusate (SENNA S) 8.6-50 MG tablet Take 2 tablets by mouth at bedtime.   sertraline  (ZOLOFT ) 100 MG tablet TAKE 1 TABLET(100 MG) BY MOUTH DAILY   simvastatin  (ZOCOR ) 20 MG tablet TAKE 1 TABLET(20 MG) BY MOUTH DAILY AT 6 PM   Vitamin D , Ergocalciferol , (DRISDOL ) 1.25 MG (50000 UNIT) CAPS capsule TAKE 1 CAPSULE BY MOUTH  EVERY 7 DAYS   fentaNYL  (DURAGESIC ) 12 MCG/HR Place 1 patch onto the skin every 3 (three) days. (Patient not taking: Reported on 09/12/2024)   gabapentin  (NEURONTIN ) 100 MG capsule Take 1 capsule (100 mg total) by mouth 2 (two) times daily. (Patient not taking: Reported on 09/12/2024)   lidocaine -prilocaine  (EMLA ) cream APPLY 1 APPLICATION TO THE AFFECTED AREA AS NEEDED. USE PRIOR TO PORT ACCESS (Patient not taking: Reported on 09/12/2024)   prochlorperazine  (COMPAZINE ) 10 MG tablet Take 1 tablet (10 mg total) by mouth every 6 (six) hours as needed (Nausea or vomiting). (Patient not taking: Reported on 09/12/2024)   tizanidine  (ZANAFLEX ) 2 MG capsule Take 1 capsule (2 mg total) by mouth in the morning and at bedtime. (Patient not taking: Reported on 09/12/2024)   Facility-Administered Encounter Medications as of 09/12/2024  Medication   heparin  lock flush 100 unit/mL   sodium chloride  flush (NS) 0.9 % injection 10 mL    Allergies (verified) Levofloxacin , Penicillins, Aleve [naproxen sodium], and Sulfonamide derivatives   History: Past Medical History:  Diagnosis Date   Allergy    seasonal   Asthma    DEPRESSION    DIABETES MELLITUS, TYPE II    Diverticulosis    HYPERLIPIDEMIA    Macular degeneration of left eye    mild, Dr.Hecker   Obesity, unspecified    Osteoarthritis of both knees    OSTEOPENIA    Osteopenia    URINARY INCONTINENCE    Past Surgical History:  Procedure Laterality Date   CATARACT EXTRACTION Left 05/24/2018   CESAREAN SECTION  01/1973   CYSTOSCOPY/URETEROSCOPY/HOLMIUM LASER/STENT PLACEMENT Right 09/20/2019   Procedure: CYSTOSCOPY/URETEROSCOPY/HOLMIUM LASER/STENT PLACEMENT;  Surgeon: Carolee Sherwood JONETTA DOUGLAS, MD;  Location: [facility];  Service: Urology;  Laterality: Right;   FRACTURE SURGERY     IR BONE MARROW BIOPSY & ASPIRATION  02/16/2023   IR IMAGING GUIDED PORT INSERTION  02/20/2019   left wrist surgery  2008   By Dr. Lilly   right ankle  1994    Family History  Problem Relation Age of Onset   Diabetes Father    Hyperlipidemia Father    Heart disease Father    Cancer Father    Hypertension Father    Colon cancer Paternal Grandmother 52   Osteoporosis Mother    Protein S deficiency Mother    Hyperlipidemia Mother    Multiple sclerosis Daughter    Cancer Other        bladder   Breast cancer Neg Hx    Social History   Socioeconomic History   Marital status: Married    Spouse name: Not on file   Number of children: 1   Years of education: Not on file   Highest education level:  Not on file  Occupational History    Employer: [employer]  Tobacco Use   Smoking status: Never   Smokeless tobacco: Never   Tobacco comments:    Lives with partner Charlann Irving) and son  Vaping Use   Vaping status: Never Used  Substance and Sexual Activity   Alcohol use: No    Alcohol/week: 0.0 standard drinks of alcohol   Drug use: No   Sexual activity: Not Currently    Partners: Female    Birth control/protection: Post-menopausal    Comment: Lives with female partner (annette hicks) and 46 yo son  Other Topics Concern   Not on file  Social History Narrative   Married   Social Drivers of Health   Financial Resource Strain: Low Risk  (09/12/2024)   Overall Financial Resource Strain (CARDIA)    Difficulty of Paying Living Expenses: Not hard at all  Food Insecurity: No Food Insecurity (09/12/2024)   Hunger Vital Sign    Worried About Running Out of Food in the Last Year: Never true    Ran Out of Food in the Last Year: Never true  Transportation Needs: No Transportation Needs (09/12/2024)   PRAPARE - Administrator, Civil Service (Medical): No    Lack of Transportation (Non-Medical): No  Physical Activity: Inactive (09/12/2024)   Exercise Vital Sign    Days of Exercise per Week: 0 days    Minutes of Exercise per Session: 0 min  Stress: No Stress Concern Present (09/12/2024)   [employer] of  Occupational Health - Occupational Stress Questionnaire    Feeling of Stress: Only a little  Social Connections: Socially Integrated (09/12/2024)   Social Connection and Isolation Panel    Frequency of Communication with Friends and Family: More than three times a week    Frequency of Social Gatherings with Friends and Family: More than three times a week    Attends Religious Services: More than 4 times per year    Active Member of [employer] or Organizations: Yes    Attends Engineer, structural: More than 4 times per year    Marital Status: Married    Tobacco Counseling Counseling given: Not Answered Tobacco comments: Lives with partner Charlann Irving) and son    Clinical Intake:  Pre-visit preparation completed: Yes  Pain : No/denies pain     BMI - recorded: 27 Nutritional Status: BMI 25 -29 Overweight Nutritional Risks: None Diabetes: Yes CBG done?: No Did pt. bring in CBG monitor from home?: No  Lab Results  Component Value Date   HGBA1C 6.1 04/19/2024   HGBA1C 5.1 10/23/2022   HGBA1C 5.8 (H) 06/22/2019     How often do you need to have someone help you when you read instructions, pamphlets, or other written materials from your doctor or pharmacy?: 1 - Never  Interpreter Needed?: No  Information entered by :: Verdie Saba, CMA   Activities of Daily Living     09/12/2024    3:19 PM  In your present state of health, do you have any difficulty performing the following activities:  Hearing? 0  Vision? 0  Difficulty concentrating or making decisions? 0  Walking or climbing stairs? 0  Dressing or bathing? 0  Doing errands, shopping? 0  Preparing Food and eating ? N  Using the Toilet? N  In the past six months, have you accidently leaked urine? Y  Do you have problems with loss of bowel control? Y  Managing your Medications? N  Managing your Finances? N  Housekeeping or managing your Housekeeping? N    Patient Care Team: Rollene Almarie LABOR, MD as  PCP - General (Internal Medicine) Obie Princella HERO, MD (Inactive) as Consulting Physician (Gastroenterology) Cleotilde Ronal RAMAN, MD as Consulting Physician (Gynecology) Sheril Coy, MD as Consulting Physician (Orthopedic Surgery) Neysa Reggy BIRCH, MD as Consulting Physician (Pulmonary Disease) Cleatus Collar, MD (Ophthalmology)  I have updated your Care Teams any recent Medical Services you may have received from other providers in the past year.     Assessment:   This is a routine wellness examination for Anna.  Hearing/Vision screen Hearing Screening - Comments:: Denies hearing difficulties   Vision Screening - Comments:: Wears rx glasses - up to date with routine eye exams with Collar Cleatus   Goals Addressed               This Visit's Progress     Patient Stated (pt-stated)        Patient stated she plans to continue monitor health condition with Multiple Myeloma and chemo tx       Depression Screen     09/12/2024    3:21 PM 09/07/2024    1:43 PM 08/10/2024    2:05 PM 07/28/2024   11:15 AM 07/13/2024   12:59 PM 06/30/2024   10:42 AM 04/19/2024    9:02 AM  PHQ 2/9 Scores  PHQ - 2 Score 0 0 0 0 0 0 0  PHQ- 9 Score 4      0    Fall Risk     09/12/2024    3:20 PM 04/19/2024    9:02 AM 09/09/2023    2:52 PM 07/23/2023    2:56 PM 02/27/2022    3:15 PM  Fall Risk   Falls in the past year? 0 0 0 0 1  Number falls in past yr: 0 0 0 0 1  Injury with Fall? 0 0 0 0 0  Risk for fall due to : No Fall Risks  No Fall Risks  Impaired balance/gait  Follow up Falls evaluation completed;Falls prevention discussed Falls evaluation completed Falls prevention discussed Falls evaluation completed Falls evaluation completed      Data saved with a previous flowsheet row definition    MEDICARE RISK AT HOME:  Medicare Risk at Home Any stairs in or around the home?: No If so, are there any without handrails?: No Home free of loose throw rugs in walkways, pet beds, electrical cords, etc?:  Yes Adequate lighting in your home to reduce risk of falls?: Yes Life alert?: No Use of a cane, walker or w/c?: No Grab bars in the bathroom?: Yes Shower chair or bench in shower?: Yes Elevated toilet seat or a handicapped toilet?: No  TIMED UP AND GO:  Was the test performed?  No  Cognitive Function: 6CIT completed        09/12/2024    3:24 PM 09/09/2023    2:54 PM  6CIT Screen  What Year? 0 points 0 points  What month? 0 points 0 points  What time? 0 points 0 points  Count back from 20 0 points 0 points  Months in reverse 0 points 0 points  Repeat phrase 0 points 0 points  Total Score 0 points 0 points    Immunizations Immunization History  Administered Date(s) Administered   Fluad Quad(high Dose 65+) 09/11/2019, 09/11/2020, 09/18/2021, 10/23/2022   INFLUENZA, HIGH DOSE SEASONAL PF 10/04/2015, 09/10/2016, 08/04/2017, 08/11/2018, 09/12/2024   Influenza Split 09/05/2012, 10/07/2013,  09/09/2014   Influenza Whole 09/13/2009, 09/02/2010, 08/24/2011   PFIZER(Purple Top)SARS-COV-2 Vaccination 02/09/2020, 03/05/2020, 08/28/2020   PPD Test 06/22/2011, 05/16/2013   Pneumococcal Conjugate-13 12/03/2014   Pneumococcal Polysaccharide-23 07/18/2008   Td 04/18/2010   Zoster, Live 09/13/2008    Screening Tests Health Maintenance  Topic Date Due   Zoster Vaccines- Shingrix (1 of 2) 07/10/1962   DTaP/Tdap/Td (2 - Tdap) 04/18/2020   COVID-19 Vaccine (4 - 2025-26 season) 08/14/2024   HEMOGLOBIN A1C  10/20/2024   OPHTHALMOLOGY EXAM  11/16/2024   Diabetic kidney evaluation - Urine ACR  04/19/2025   FOOT EXAM  04/19/2025   Diabetic kidney evaluation - eGFR measurement  09/07/2025   Medicare Annual Wellness (AWV)  09/12/2025   Pneumococcal Vaccine: 50+ Years  Completed   Influenza Vaccine  Completed   DEXA SCAN  Completed   HPV VACCINES  Aged Out   Meningococcal B Vaccine  Aged Out   Colonoscopy  Discontinued    Health Maintenance Items Addressed:  Vaccines Given today:  Influenza vaccine - High Dose  Additional Screening:  Vision Screening: Recommended annual ophthalmology exams for early detection of glaucoma and other disorders of the eye. Is the patient up to date with their annual eye exam?  Yes  Who is the provider or what is the name of the office in which the patient attends annual eye exams? Lamarr Burkitt of [facility]  Dental Screening: Recommended annual dental exams for proper oral hygiene  Community Resource Referral / Chronic Care Management: CRR required this visit?  No   CCM required this visit?  No   Plan:    I have personally reviewed and noted the following in the patient's chart:   Medical and social history Use of alcohol, tobacco or illicit drugs  Current medications and supplements including opioid prescriptions. Patient is currently taking an opioid prescription.  Functional ability and status Nutritional status Physical activity Advanced directives List of other physicians Hospitalizations, surgeries, and ER visits in previous 12 months Vitals Screenings to include cognitive, depression, and falls Referrals and appointments  In addition, I have reviewed and discussed with patient certain preventive protocols, quality metrics, and best practice recommendations. A written personalized care plan for preventive services as well as general preventive health recommendations were provided to patient.   Verdie CHRISTELLA Saba, CMA   09/12/2024   After Visit Summary: (In Person-Declined) Patient declined AVS at this time.  Notes: Nothing significant to report at this time.

## 2024-09-12 NOTE — Patient Instructions (Addendum)
 Faith Orr,  Thank you for taking the time for your Medicare Wellness Visit. I appreciate your continued commitment to your health goals. Please review the care plan we discussed, and feel free to reach out if I can assist you further.  Medicare recommends these wellness visits once per year to help you and your care team stay ahead of potential health issues. These visits are designed to focus on prevention, allowing your provider to concentrate on managing your acute and chronic conditions during your regular appointments.  Please note that Annual Wellness Visits do not include a physical exam. Some assessments may be limited, especially if the visit was conducted virtually. If needed, we may recommend a separate in-person follow-up with your provider.  Ongoing Care Seeing your primary care provider every 3 to 6 months helps us  monitor your health and provide consistent, personalized care.   Referrals If a referral was made during today's visit and you haven't received any updates within two weeks, please contact the referred provider directly to check on the status.  Recommended Screenings:  Health Maintenance  Topic Date Due   Zoster (Shingles) Vaccine (1 of 2) 07/10/1962   DTaP/Tdap/Td vaccine (2 - Tdap) 04/18/2020   COVID-19 Vaccine (4 - 2025-26 season) 08/14/2024   Hemoglobin A1C  10/20/2024   Eye exam for diabetics  11/16/2024   Yearly kidney health urinalysis for diabetes  04/19/2025   Complete foot exam   04/19/2025   Yearly kidney function blood test for diabetes  09/07/2025   Medicare Annual Wellness Visit  09/12/2025   Pneumococcal Vaccine for age over 17  Completed   Flu Shot  Completed   DEXA scan (bone density measurement)  Completed   HPV Vaccine  Aged Out   Meningitis B Vaccine  Aged Out   Colon Cancer Screening  Discontinued       09/12/2024    3:16 PM  Advanced Directives  Does Patient Have a Medical Advance Directive? Yes  Type of Engineer, mining of [place];Living will  Copy of Healthcare Power of Attorney in Chart? No - copy requested   Advance Care Planning is important because it: Ensures you receive medical care that aligns with your values, goals, and preferences. Provides guidance to your family and loved ones, reducing the emotional burden of decision-making during critical moments.  Vision: Annual vision screenings are recommended for early detection of glaucoma, cataracts, and diabetic retinopathy. These exams can also reveal signs of chronic conditions such as diabetes and high blood pressure.  Dental: Annual dental screenings help detect early signs of oral cancer, gum disease, and other conditions linked to overall health, including heart disease and diabetes.

## 2024-09-13 ENCOUNTER — Other Ambulatory Visit: Payer: Self-pay

## 2024-09-13 DIAGNOSIS — C9000 Multiple myeloma not having achieved remission: Secondary | ICD-10-CM

## 2024-09-15 ENCOUNTER — Encounter: Payer: Self-pay | Admitting: Hematology

## 2024-09-15 MED ORDER — OXYCODONE HCL 10 MG PO TABS: 10.0000 mg | ORAL_TABLET | Freq: Four times a day (QID) | ORAL | 0 refills | Status: DC | PRN

## 2024-09-19 ENCOUNTER — Encounter: Payer: Self-pay | Admitting: Hematology

## 2024-09-20 DIAGNOSIS — H26493 Other secondary cataract, bilateral: Secondary | ICD-10-CM | POA: Diagnosis not present

## 2024-09-20 DIAGNOSIS — H524 Presbyopia: Secondary | ICD-10-CM | POA: Diagnosis not present

## 2024-09-20 DIAGNOSIS — H40013 Open angle with borderline findings, low risk, bilateral: Secondary | ICD-10-CM | POA: Diagnosis not present

## 2024-09-20 DIAGNOSIS — E119 Type 2 diabetes mellitus without complications: Secondary | ICD-10-CM | POA: Diagnosis not present

## 2024-09-20 DIAGNOSIS — H353132 Nonexudative age-related macular degeneration, bilateral, intermediate dry stage: Secondary | ICD-10-CM | POA: Diagnosis not present

## 2024-09-20 LAB — OPHTHALMOLOGY REPORT-SCANNED

## 2024-09-22 ENCOUNTER — Ambulatory Visit: Admitting: Hematology

## 2024-09-22 ENCOUNTER — Inpatient Hospital Stay: Attending: Hematology

## 2024-09-22 ENCOUNTER — Other Ambulatory Visit

## 2024-09-22 ENCOUNTER — Inpatient Hospital Stay

## 2024-09-22 VITALS — BP 124/56 | HR 69 | Temp 98.0°F | Resp 14 | Wt 153.2 lb

## 2024-09-22 DIAGNOSIS — Z5112 Encounter for antineoplastic immunotherapy: Secondary | ICD-10-CM | POA: Diagnosis present

## 2024-09-22 DIAGNOSIS — C9000 Multiple myeloma not having achieved remission: Secondary | ICD-10-CM | POA: Diagnosis present

## 2024-09-22 DIAGNOSIS — Z79899 Other long term (current) drug therapy: Secondary | ICD-10-CM | POA: Diagnosis not present

## 2024-09-22 DIAGNOSIS — Z7189 Other specified counseling: Secondary | ICD-10-CM

## 2024-09-22 DIAGNOSIS — Z95828 Presence of other vascular implants and grafts: Secondary | ICD-10-CM

## 2024-09-22 LAB — CMP (CANCER CENTER ONLY)
ALT: 12 U/L (ref 0–44)
AST: 12 U/L — ABNORMAL LOW (ref 15–41)
Albumin: 4 g/dL (ref 3.5–5.0)
Alkaline Phosphatase: 92 U/L (ref 38–126)
Anion gap: 7 (ref 5–15)
BUN: 19 mg/dL (ref 8–23)
CO2: 26 mmol/L (ref 22–32)
Calcium: 10.3 mg/dL (ref 8.9–10.3)
Chloride: 107 mmol/L (ref 98–111)
Creatinine: 0.9 mg/dL (ref 0.44–1.00)
GFR, Estimated: >60 mL/min
Glucose, Bld: 254 mg/dL — ABNORMAL HIGH (ref 70–99)
Potassium: 3.6 mmol/L (ref 3.5–5.1)
Sodium: 140 mmol/L (ref 135–145)
Total Bilirubin: 0.7 mg/dL (ref 0.0–1.2)
Total Protein: 6.2 g/dL — ABNORMAL LOW (ref 6.5–8.1)

## 2024-09-22 LAB — CBC WITH DIFFERENTIAL (CANCER CENTER ONLY)
Abs Immature Granulocytes: 0.01 K/uL (ref 0.00–0.07)
Basophils Absolute: 0.1 K/uL (ref 0.0–0.1)
Basophils Relative: 2 %
Eosinophils Absolute: 0.1 K/uL (ref 0.0–0.5)
Eosinophils Relative: 2 %
HCT: 38.8 % (ref 36.0–46.0)
Hemoglobin: 12.9 g/dL (ref 12.0–15.0)
Immature Granulocytes: 0 %
Lymphocytes Relative: 30 %
Lymphs Abs: 1.1 K/uL (ref 0.7–4.0)
MCH: 32.7 pg (ref 26.0–34.0)
MCHC: 33.2 g/dL (ref 30.0–36.0)
MCV: 98.2 fL (ref 80.0–100.0)
Monocytes Absolute: 0.7 K/uL (ref 0.1–1.0)
Monocytes Relative: 20 %
Neutro Abs: 1.7 K/uL (ref 1.7–7.7)
Neutrophils Relative %: 46 %
Platelet Count: 183 K/uL (ref 150–400)
RBC: 3.95 MIL/uL (ref 3.87–5.11)
RDW: 13.3 % (ref 11.5–15.5)
WBC Count: 3.8 K/uL — ABNORMAL LOW (ref 4.0–10.5)
nRBC: 0 % (ref 0.0–0.2)

## 2024-09-22 MED ORDER — ACETAMINOPHEN 325 MG PO TABS
650.0000 mg | ORAL_TABLET | Freq: Once | ORAL | Status: AC
  Administered 2024-09-22: 650 mg via ORAL
  Filled 2024-09-22: qty 2

## 2024-09-22 MED ORDER — PROCHLORPERAZINE MALEATE 10 MG PO TABS
10.0000 mg | ORAL_TABLET | Freq: Once | ORAL | Status: AC
  Administered 2024-09-22: 10 mg via ORAL
  Filled 2024-09-22: qty 1

## 2024-09-22 MED ORDER — DEXTROSE 5 % IV SOLN
36.0000 mg/m2 | Freq: Once | INTRAVENOUS | Status: AC
  Administered 2024-09-22: 60 mg via INTRAVENOUS
  Filled 2024-09-22: qty 30

## 2024-09-22 MED ORDER — ZOLEDRONIC ACID 4 MG/100ML IV SOLN
4.0000 mg | Freq: Once | INTRAVENOUS | Status: AC
  Administered 2024-09-22: 4 mg via INTRAVENOUS
  Filled 2024-09-22: qty 100

## 2024-09-22 MED ORDER — DEXAMETHASONE 4 MG PO TABS
8.0000 mg | ORAL_TABLET | Freq: Once | ORAL | Status: AC
  Administered 2024-09-22: 8 mg via ORAL
  Filled 2024-09-22: qty 2

## 2024-09-22 MED ORDER — FAMOTIDINE IN NACL 20-0.9 MG/50ML-% IV SOLN
20.0000 mg | Freq: Once | INTRAVENOUS | Status: AC
  Administered 2024-09-22: 20 mg via INTRAVENOUS
  Filled 2024-09-22: qty 50

## 2024-09-22 MED ORDER — DIPHENHYDRAMINE HCL 25 MG PO CAPS
25.0000 mg | ORAL_CAPSULE | Freq: Once | ORAL | Status: AC
  Administered 2024-09-22: 25 mg via ORAL
  Filled 2024-09-22: qty 1

## 2024-09-22 MED ORDER — SODIUM CHLORIDE 0.9 % IV SOLN: Freq: Once | INTRAVENOUS | Status: AC

## 2024-09-22 NOTE — Patient Instructions (Signed)

## 2024-10-03 ENCOUNTER — Encounter (HOSPITAL_COMMUNITY)
Admission: RE | Admit: 2024-10-03 | Discharge: 2024-10-03 | Disposition: A | Discharge status: Home / Self Care | Source: Ambulatory Visit | Attending: Hematology | Admitting: Hematology

## 2024-10-03 DIAGNOSIS — C9000 Multiple myeloma not having achieved remission: Secondary | ICD-10-CM | POA: Diagnosis not present

## 2024-10-03 LAB — GLUCOSE, CAPILLARY: Glucose-Capillary: 135 mg/dL — ABNORMAL HIGH (ref 70–99)

## 2024-10-03 MED ORDER — FLUDEOXYGLUCOSE F - 18 (FDG) INJECTION
7.6500 | Freq: Once | INTRAVENOUS | Status: AC
  Administered 2024-10-03: 7.66 via INTRAVENOUS

## 2024-10-04 ENCOUNTER — Other Ambulatory Visit: Payer: Self-pay

## 2024-10-04 DIAGNOSIS — C9000 Multiple myeloma not having achieved remission: Secondary | ICD-10-CM

## 2024-10-04 MED ORDER — LENALIDOMIDE 5 MG PO CAPS: ORAL_CAPSULE | ORAL | 0 refills | Status: DC

## 2024-10-05 ENCOUNTER — Inpatient Hospital Stay

## 2024-10-05 VITALS — BP 122/56 | HR 61 | Temp 97.5°F | Resp 18 | Wt 155.8 lb

## 2024-10-05 DIAGNOSIS — Z95828 Presence of other vascular implants and grafts: Secondary | ICD-10-CM

## 2024-10-05 DIAGNOSIS — Z5112 Encounter for antineoplastic immunotherapy: Secondary | ICD-10-CM | POA: Diagnosis not present

## 2024-10-05 DIAGNOSIS — C9000 Multiple myeloma not having achieved remission: Secondary | ICD-10-CM

## 2024-10-05 DIAGNOSIS — Z7189 Other specified counseling: Secondary | ICD-10-CM

## 2024-10-05 LAB — CBC WITH DIFFERENTIAL (CANCER CENTER ONLY)
Abs Immature Granulocytes: 0.01 K/uL (ref 0.00–0.07)
Basophils Absolute: 0.1 K/uL (ref 0.0–0.1)
Basophils Relative: 1 %
Eosinophils Absolute: 0.2 K/uL (ref 0.0–0.5)
Eosinophils Relative: 3 %
HCT: 36 % (ref 36.0–46.0)
Hemoglobin: 12.3 g/dL (ref 12.0–15.0)
Immature Granulocytes: 0 %
Lymphocytes Relative: 23 %
Lymphs Abs: 1.2 K/uL (ref 0.7–4.0)
MCH: 33.3 pg (ref 26.0–34.0)
MCHC: 34.2 g/dL (ref 30.0–36.0)
MCV: 97.6 fL (ref 80.0–100.0)
Monocytes Absolute: 1 K/uL (ref 0.1–1.0)
Monocytes Relative: 19 %
Neutro Abs: 2.9 K/uL (ref 1.7–7.7)
Neutrophils Relative %: 54 %
Platelet Count: 148 K/uL — ABNORMAL LOW (ref 150–400)
RBC: 3.69 MIL/uL — ABNORMAL LOW (ref 3.87–5.11)
RDW: 13.4 % (ref 11.5–15.5)
WBC Count: 5.4 K/uL (ref 4.0–10.5)
nRBC: 0 % (ref 0.0–0.2)

## 2024-10-05 LAB — CMP (CANCER CENTER ONLY)
ALT: 13 U/L (ref 0–44)
AST: 11 U/L — ABNORMAL LOW (ref 15–41)
Albumin: 3.7 g/dL (ref 3.5–5.0)
Alkaline Phosphatase: 90 U/L (ref 38–126)
Anion gap: 7 (ref 5–15)
BUN: 17 mg/dL (ref 8–23)
CO2: 25 mmol/L (ref 22–32)
Calcium: 9.3 mg/dL (ref 8.9–10.3)
Chloride: 106 mmol/L (ref 98–111)
Creatinine: 0.93 mg/dL (ref 0.44–1.00)
GFR, Estimated: >60 mL/min (ref >60)
Glucose, Bld: 214 mg/dL — ABNORMAL HIGH (ref 70–99)
Potassium: 4 mmol/L (ref 3.5–5.1)
Sodium: 138 mmol/L (ref 135–145)
Total Bilirubin: 0.6 mg/dL (ref 0.0–1.2)
Total Protein: 5.7 g/dL — ABNORMAL LOW (ref 6.5–8.1)

## 2024-10-05 MED ORDER — ACETAMINOPHEN 325 MG PO TABS
650.0000 mg | ORAL_TABLET | Freq: Once | ORAL | Status: AC
  Administered 2024-10-05: 650 mg via ORAL
  Filled 2024-10-05: qty 2

## 2024-10-05 MED ORDER — SODIUM CHLORIDE 0.9 % IV SOLN: Freq: Once | INTRAVENOUS | Status: AC

## 2024-10-05 MED ORDER — DEXTROSE 5 % IV SOLN
36.0000 mg/m2 | Freq: Once | INTRAVENOUS | Status: AC
  Administered 2024-10-05: 60 mg via INTRAVENOUS
  Filled 2024-10-05: qty 30

## 2024-10-05 MED ORDER — PROCHLORPERAZINE MALEATE 10 MG PO TABS
10.0000 mg | ORAL_TABLET | Freq: Once | ORAL | Status: AC
  Administered 2024-10-05: 10 mg via ORAL
  Filled 2024-10-05: qty 1

## 2024-10-05 MED ORDER — DIPHENHYDRAMINE HCL 25 MG PO CAPS
25.0000 mg | ORAL_CAPSULE | Freq: Once | ORAL | Status: AC
  Administered 2024-10-05: 25 mg via ORAL
  Filled 2024-10-05: qty 1

## 2024-10-05 MED ORDER — FAMOTIDINE IN NACL 20-0.9 MG/50ML-% IV SOLN
20.0000 mg | Freq: Once | INTRAVENOUS | Status: AC
  Administered 2024-10-05: 20 mg via INTRAVENOUS
  Filled 2024-10-05: qty 50

## 2024-10-05 MED ORDER — SODIUM CHLORIDE 0.9% FLUSH
10.0000 mL | INTRAVENOUS | Status: DC | PRN
  Administered 2024-10-05: 10 mL

## 2024-10-05 MED ORDER — DEXAMETHASONE 4 MG PO TABS
8.0000 mg | ORAL_TABLET | Freq: Once | ORAL | Status: AC
  Administered 2024-10-05: 8 mg via ORAL
  Filled 2024-10-05: qty 2

## 2024-10-05 NOTE — Patient Instructions (Signed)
 [facility] - A DEPT OF [facility]  Discharge Instructions: Thank you for choosing [facility] to provide your oncology and hematology care.   If you have a lab appointment with the Cancer Center, please go directly to the Cancer Center and check in at the registration area.   Wear comfortable clothing and clothing appropriate for easy access to any Portacath or PICC line.   We strive to give you quality time with your provider. You may need to reschedule your appointment if you arrive late (15 or more minutes).  Arriving late affects you and other patients whose appointments are after yours.  Also, if you miss three or more appointments without notifying the office, you may be dismissed from the clinic at the provider's discretion.      For prescription refill requests, have your pharmacy contact our office and allow 72 hours for refills to be completed.    Today you received the following chemotherapy and/or immunotherapy agent: Kyprolis    To help prevent nausea and vomiting after your treatment, we encourage you to take your nausea medication as directed.  BELOW ARE SYMPTOMS THAT SHOULD BE REPORTED IMMEDIATELY: *FEVER GREATER THAN 100.4 F (38 C) OR HIGHER *CHILLS OR SWEATING *NAUSEA AND VOMITING THAT IS NOT CONTROLLED WITH YOUR NAUSEA MEDICATION *UNUSUAL SHORTNESS OF BREATH *UNUSUAL BRUISING OR BLEEDING *URINARY PROBLEMS (pain or burning when urinating, or frequent urination) *BOWEL PROBLEMS (unusual diarrhea, constipation, pain near the anus) TENDERNESS IN MOUTH AND THROAT WITH OR WITHOUT PRESENCE OF ULCERS (sore throat, sores in mouth, or a toothache) UNUSUAL RASH, SWELLING OR PAIN  UNUSUAL VAGINAL DISCHARGE OR ITCHING   Items with * indicate a potential emergency and should be followed up as soon as possible or go to the Emergency Department if any problems should occur.  Please show the CHEMOTHERAPY ALERT CARD or IMMUNOTHERAPY ALERT  CARD at check-in to the Emergency Department and triage nurse.  Should you have questions after your visit or need to cancel or reschedule your appointment, please contact [facility] - A DEPT OF [facility]  Dept: [phone]  and follow the prompts.  Office hours are 8:00 a.m. to 4:30 p.m. Monday - Friday. Please note that voicemails left after 4:00 p.m. may not be returned until the following business day.  We are closed weekends and major holidays. You have access to a nurse at all times for urgent questions. Please call the main number to the clinic Dept: [phone] and follow the prompts.   For any non-urgent questions, you may also contact your provider using MyChart. We now offer e-Visits for anyone 7 and older to request care online for non-urgent symptoms. For details visit [email or web address].   Also download the MyChart app! Go to the app store, search MyChart, open the app, select [place], and log in with your MyChart username and password.

## 2024-10-10 ENCOUNTER — Telehealth: Payer: Self-pay

## 2024-10-10 NOTE — Telephone Encounter (Signed)
 Oral Oncology Patient Advocate Encounter  Was successful in securing patient a $8000 grant from [facility] to provide copayment coverage for Revlimid .  This will keep the out of pocket expense at $0.     Healthwell ID: [id number]   The billing information is as follows and has been shared with [employer].    RxBin: [id number] PCN: [id number] Member ID: [id number] Group ID: [id number] Dates of Eligibility: 09/10/24 through 09/09/25  Fund:  MM  Lucie Lamer, CPhT [place]  [facility] Oncology Pharmacy Patient Advocate Specialist II [place] Phone: [phone]  Fax: [phone] [email or web address]

## 2024-10-19 NOTE — Progress Notes (Unsigned)
 HEMATOLOGY/ONCOLOGY CLINIC NOTE  Date of Service: 10/20/24   Patient Care Team: Rollene Almarie LABOR, MD as PCP - General (Internal Medicine) Obie Princella HERO, MD (Inactive) as Consulting Physician (Gastroenterology) Cleotilde Ronal RAMAN, MD as Consulting Physician (Gynecology) Sheril Coy, MD as Consulting Physician (Orthopedic Surgery) Neysa Reggy BIRCH, MD as Consulting Physician (Pulmonary Disease) Cleatus Collar, MD (Ophthalmology)  CHIEF COMPLAINTS/PURPOSE OF VISIT:  Follow-up for continued evaluation and management of multiple myeloma  HISTORY OF PRESENTING ILLNESS:  Please see previous notes for details on initial presentation  Current Treatment: Carfilzomib  + Revlimid  maintenance.  INTERVAL HISTORY:   Faith Orr is a 83 y.o. female who is here for continued evaluation and management of the patient's multiple myeloma. Patient was last seen by Dr. Onesimo on 09/07/2024. She presents unaccompanied for this visit.   Faith Orr reports she did not tolerate her last Zometa  infusion with diffuse bone pain that lasted over two weeks. She reports her energy levels are fairly unchanged. She has occasional nausea without vomiting. She takes zofran  which improves her symptoms. She has intermittent episodes of diarrhea that resolves on its own. She has experienced GERD like symptoms which requires taking protonix . She denies fevers, chills, sweats, shortness of breath, chest pain or cough. She has no other complaints.   MEDICAL HISTORY:  Past Medical History:  Diagnosis Date   Allergy    seasonal   Asthma    DEPRESSION    DIABETES MELLITUS, TYPE II    Diverticulosis    HYPERLIPIDEMIA    Macular degeneration of left eye    mild, FaithHecker   Obesity, unspecified    Osteoarthritis of both knees    OSTEOPENIA    Osteopenia    URINARY INCONTINENCE     SURGICAL HISTORY: Past Surgical History:  Procedure Laterality Date   CATARACT EXTRACTION Left 05/24/2018   CESAREAN  SECTION  01/1973   CYSTOSCOPY/URETEROSCOPY/HOLMIUM LASER/STENT PLACEMENT Right 09/20/2019   Procedure: CYSTOSCOPY/URETEROSCOPY/HOLMIUM LASER/STENT PLACEMENT;  Surgeon: Faith Sherwood BIRCH DOUGLAS, MD;  Location: [facility];  Service: Urology;  Laterality: Right;   FRACTURE SURGERY     IR BONE MARROW BIOPSY & ASPIRATION  02/16/2023   IR IMAGING GUIDED PORT INSERTION  02/20/2019   left wrist surgery  2008   By Dr. Lilly   right ankle  1994    SOCIAL HISTORY: Social History   Socioeconomic History   Marital status: Married    Spouse name: Not on file   Number of children: 1   Years of education: Not on file   Highest education level: Not on file  Occupational History    Employer: [employer]  Tobacco Use   Smoking status: Never   Smokeless tobacco: Never   Tobacco comments:    Lives with partner Faith Orr) and son  Vaping Use   Vaping status: Never Used  Substance and Sexual Activity   Alcohol use: No    Alcohol/week: 0.0 standard drinks of alcohol   Drug use: No   Sexual activity: Not Currently    Partners: Female    Birth control/protection: Post-menopausal    Comment: Lives with female partner (Faith Orr) and 55 yo son  Other Topics Concern   Not on file  Social History Narrative   Married   Social Drivers of Health   Financial Resource Strain: Low Risk  (09/12/2024)   Overall Financial Resource Strain (CARDIA)    Difficulty of Paying Living Expenses: Not hard at all  Food Insecurity:  No Food Insecurity (09/12/2024)   Hunger Vital Sign    Worried About Running Out of Food in the Last Year: Never true    Ran Out of Food in the Last Year: Never true  Transportation Needs: No Transportation Needs (09/12/2024)   PRAPARE - Administrator, Civil Service (Medical): No    Lack of Transportation (Non-Medical): No  Physical Activity: Inactive (09/12/2024)   Exercise Vital Sign    Days of Exercise per Week: 0 days    Minutes of Exercise  per Session: 0 min  Stress: No Stress Concern Present (09/12/2024)   Faith Orr of Occupational Health - Occupational Stress Questionnaire    Feeling of Stress: Only a little  Social Connections: Socially Integrated (09/12/2024)   Social Connection and Isolation Panel    Frequency of Communication with Friends and Family: More than three times a week    Frequency of Social Gatherings with Friends and Family: More than three times a week    Attends Religious Services: More than 4 times per year    Active Member of [employer] or Organizations: Yes    Attends Engineer, Structural: More than 4 times per year    Marital Status: Married  Catering Manager Violence: Not At Risk (09/12/2024)   Humiliation, Afraid, Rape, and Kick questionnaire    Fear of Current or Ex-Partner: No    Emotionally Abused: No    Physically Abused: No    Sexually Abused: No    FAMILY HISTORY: Family History  Problem Relation Age of Onset   Diabetes Father    Hyperlipidemia Father    Heart disease Father    Cancer Father    Hypertension Father    Colon cancer Paternal Grandmother 26   Osteoporosis Mother    Protein S deficiency Mother    Hyperlipidemia Mother    Multiple sclerosis Daughter    Cancer Other        bladder   Breast cancer Neg Hx     ALLERGIES:  is allergic to levofloxacin , penicillins, aleve [naproxen sodium], and sulfonamide derivatives.  MEDICATIONS:  Current Outpatient Medications  Medication Sig Dispense Refill   acyclovir  (ZOVIRAX ) 400 MG tablet TAKE 1 TABLET(400 MG) BY MOUTH TWICE DAILY 60 tablet 5   Blood Glucose Monitoring Suppl (FREESTYLE FREEDOM LITE) W/DEVICE KIT Use to check blood sugars twice a day Dx 250.00 1 each 0   Calcium  Carbonate-Vitamin D  600-400 MG-UNIT tablet Take 1 tablet by mouth 2 (two) times daily.     dexamethasone  (DECADRON ) 4 MG tablet Take 5 tablets (20 mg total) by mouth once a week. On D22 of each cycle of treatment 20 tablet 5   ELIQUIS  2.5 MG  TABS tablet TAKE 1 TABLET(2.5 MG) BY MOUTH TWICE DAILY 60 tablet 2   fentaNYL  (DURAGESIC ) 12 MCG/HR Place 1 patch onto the skin every 3 (three) days. (Patient not taking: Reported on 09/12/2024) 15 patch 0   fluticasone  (FLONASE ) 50 MCG/ACT nasal spray Place 1 spray into both nostrils daily. 48 g 3   gabapentin  (NEURONTIN ) 100 MG capsule Take 1 capsule (100 mg total) by mouth 2 (two) times daily. (Patient not taking: Reported on 09/12/2024) 60 capsule 0   glucose blood (FREESTYLE LITE) test strip CHECK BLOOD SUGAR TWICE DAILY AS DIRECTED Dx 250.00 180 each 3   Lancets (FREESTYLE) lancets Use twice daily to check sugars. 100 each 11   lenalidomide  (REVLIMID ) 5 MG capsule Take 1 capsule (5 mg total) by mouth daily for 21  days. Take 7 days off. Repeat cycle 21 capsule 0   lidocaine  (LIDODERM ) 5 % Place 1 patch onto the skin daily. Remove & Discard patch within 12 hours or as directed by MD 30 patch 0   lidocaine -prilocaine  (EMLA ) cream APPLY 1 APPLICATION TO THE AFFECTED AREA AS NEEDED. USE PRIOR TO PORT ACCESS (Patient not taking: Reported on 09/12/2024) 30 g 0   meclizine  (ANTIVERT ) 25 MG tablet Take 1 tablet (25 mg total) by mouth every 8 (eight) hours as needed for dizziness. 20 tablet 0   metFORMIN  (GLUCOPHAGE -XR) 500 MG 24 hr tablet TAKE 1 TABLET(500 MG) BY MOUTH TWICE DAILY WITH A MEAL 180 tablet 3   metroNIDAZOLE  (METROGEL ) 1 % gel Apply topically in the morning and at bedtime. 60 g 1   Multiple Vitamins-Minerals (ICAPS) CAPS Take 1 capsule by mouth daily after breakfast.     ondansetron  (ZOFRAN ) 8 MG tablet TAKE 1 TABLET(8 MG) BY MOUTH TWICE DAILY AS NEEDED FOR NAUSEA OR VOMITING 30 tablet 1   Oxycodone  HCl 10 MG TABS Take 1 tablet (10 mg total) by mouth every 6 (six) hours as needed. 90 tablet 0   pantoprazole  (PROTONIX ) 20 MG tablet TAKE 1 TABLET(20 MG) BY MOUTH DAILY 30 tablet 2   polyethylene glycol (MIRALAX / GLYCOLAX) packet Take 17 g by mouth daily after breakfast.     potassium chloride   SA (KLOR-CON  M) 20 MEQ tablet TAKE 2 TABLETS(40 MEQ) BY MOUTH TWICE DAILY 360 tablet 1   prochlorperazine  (COMPAZINE ) 10 MG tablet Take 1 tablet (10 mg total) by mouth every 6 (six) hours as needed (Nausea or vomiting). (Patient not taking: Reported on 09/12/2024) 30 tablet 1   senna-docusate (SENNA S) 8.6-50 MG tablet Take 2 tablets by mouth at bedtime. 60 tablet 2   sertraline  (ZOLOFT ) 100 MG tablet TAKE 1 TABLET(100 MG) BY MOUTH DAILY 90 tablet 3   simvastatin  (ZOCOR ) 20 MG tablet TAKE 1 TABLET(20 MG) BY MOUTH DAILY AT 6 PM 90 tablet 3   tizanidine  (ZANAFLEX ) 2 MG capsule Take 1 capsule (2 mg total) by mouth in the morning and at bedtime. (Patient not taking: Reported on 09/12/2024) 20 capsule 0   Vitamin D , Ergocalciferol , (DRISDOL ) 1.25 MG (50000 UNIT) CAPS capsule TAKE 1 CAPSULE BY MOUTH EVERY 7 DAYS 12 capsule 0   No current facility-administered medications for this visit.   Facility-Administered Medications Ordered in Other Visits  Medication Dose Route Frequency Provider Last Rate Last Admin   heparin  lock flush 100 unit/mL  500 Units Intracatheter Once PRN Kale, Gautam Kishore, MD       sodium chloride  flush (NS) 0.9 % injection 10 mL  10 mL Intracatheter PRN Kale, Gautam Kishore, MD   10 mL at 12/17/23 1336    REVIEW OF SYSTEMS:    10 Point review of Systems was done is negative except as noted above.   PHYSICAL EXAMINATION:  ECOG FS:1 - Symptomatic but completely ambulatory  .BP (!) 125/54 (BP Location: Left Arm, Patient Position: Sitting)   Pulse 65   Temp 97.7 F (36.5 C) (Temporal)   Resp 16   Ht 5' 3 (1.6 m)   Wt 155 lb (70.3 kg)   LMP 10/09/2012   SpO2 98%   BMI 27.46 kg/m    Wt Readings from Last 3 Encounters:  10/20/24 155 lb (70.3 kg)  10/05/24 155 lb 12 oz (70.6 kg)  09/22/24 153 lb 4 oz (69.5 kg)   Body mass index is 27.46 kg/m.  GENERAL:alert, in no acute distress and comfortable SKIN: no acute rashes, no significant lesions EYES: conjunctiva  are pink and non-injected, sclera anicteric LUNGS: clear to auscultation b/l with normal respiratory effort HEART: regular rate & rhythm Extremity: no pedal edema PSYCH: alert & oriented x 3 with fluent speech NEURO: no focal motor/sensory deficits    LABS     Latest Ref Rng & Units 10/20/2024   10:28 AM 10/05/2024   12:28 PM 09/22/2024    9:21 AM  CBC  WBC 4.0 - 10.5 K/uL 5.5  5.4  3.8   Hemoglobin 12.0 - 15.0 g/dL 86.0  87.6  87.0   Hematocrit 36.0 - 46.0 % 40.9  36.0  38.8   Platelets 150 - 400 K/uL 171  148  183       Latest Ref Rng & Units 10/20/2024   10:28 AM 10/05/2024   12:28 PM 09/22/2024    9:21 AM  CMP  Glucose 70 - 99 mg/dL 762  785  745   BUN 8 - 23 mg/dL 13  17  19    Creatinine 0.44 - 1.00 mg/dL 9.03  9.06  9.09   Sodium 135 - 145 mmol/L 139  138  140   Potassium 3.5 - 5.1 mmol/L 4.1  4.0  3.6   Chloride 98 - 111 mmol/L 108  106  107   CO2 22 - 32 mmol/L 24  25  26    Calcium  8.9 - 10.3 mg/dL 89.9  9.3  89.6   Total Protein 6.5 - 8.1 g/dL 6.3  5.7  6.2   Total Bilirubin 0.0 - 1.2 mg/dL 0.6  0.6  0.7   Alkaline Phos 38 - 126 U/L 95  90  92   AST 15 - 41 U/L 14  11  12    ALT 0 - 44 U/L 14  13  12       09/18/2019 BM Bx Report ([id number])   09/18/2019 FISH Panel    05/30/2019 BM Bx   01/06/2019 BM Bx:     01/06/19 Cytogenetics:      05/30/19 BM Biopsy:   09/18/2019 FISH Panel    09/18/2019 BM Surgical Pathology ([id number])     RADIOGRAPHIC STUDIES: I have personally reviewed the radiological images as listed and agreed with the findings in the report. NM PET Image Restage (PS) Whole Body Result Date: 10/05/2024 CLINICAL DATA:  Subsequent treatment strategy for multiple myeloma. EXAM: NUCLEAR MEDICINE PET WHOLE BODY TECHNIQUE: 7.66 mCi F-18 FDG was injected intravenously. Full-ring PET imaging was performed from the head to foot after the radiotracer. CT data was obtained and used for attenuation correction and anatomic  localization. Fasting blood glucose: 135 mg/dl COMPARISON:  PET-CT 97/89/7974 and 10/14/2023. CT of the abdomen and pelvis 12/31/2023. FINDINGS: Mediastinal blood pool activity: SUV max 2.6 HEAD/ NECK: No significant change in hypermetabolic left neck nodules. In the left parapharyngeal space, there is a 9 mm nodule on image 41/4 which has an SUV max of 9.6 (previously 10.7). There is a left level 2 node measuring 10 mm on image 51/4 with an SUV max of 19.0 (previously 17.9). No new nodules are identified. No suspicious activity identified within the pharyngeal mucosal space. Incidental CT findings: none CHEST: There are no hypermetabolic mediastinal, hilar or axillary lymph nodes. No hypermetabolic pulmonary activity or suspicious nodularity. Incidental CT findings: Right IJ Port-A-Cath extends to the superior cavoatrial junction. There is atherosclerosis of the aorta, great vessels and coronary arteries with calcifications of the aortic valve.  There are calcified left hilar lymph nodes the left lung granuloma. Underlying mild centrilobular emphysema with stable clustered noncalcified nodules in the lingula (image 25/7). ABDOMEN/PELVIS: There is no hypermetabolic activity within the liver, adrenal glands, spleen or pancreas. There is no hypermetabolic nodal activity in the abdomen or pelvis. Incidental CT findings: Calcified splenic granulomas. Mild aortoiliac atherosclerosis. SKELETON: Scattered small foci of hypermetabolic activity are demonstrated within the thoracolumbar spine, similar to the previous study. The greatest uptake is present anteriorly in the L3 vertebral body (SUV max 5.4, previously 4.2). This corresponds with osteophytes, and no enlarging lucent lesions or new hypermetabolic osseous lesions are seen. Widespread lucencies are again noted within the spine and bony pelvis, likely due to treated myeloma. Incidental CT findings: Old right-sided rib fractures. EXTREMITIES: No hypermetabolic activity  within the extremities to suggest active multiple myeloma. Incidental CT findings: Iliofemoral atherosclerosis. IMPRESSION: 1. No significant change in hypermetabolic left neck nodules, likely incidental parotid lesions. Lymph nodes considered less likely. 2. No definite evidence of metabolically active multiple myeloma. Grossly stable treated osseous lesions with scattered lucencies in the axial skeleton. 3. Stable scattered small foci of hypermetabolic activity within the thoracolumbar spine, likely degenerative. No new hypermetabolic osseous lesions identified. 4. Aortic Atherosclerosis (ICD10-I70.0) and Emphysema (ICD10-J43.9). Electronically Signed   By: Elsie Perone M.D.   On: 10/05/2024 16:23     ASSESSMENT & PLAN:  Lidie Glade is a 83 y.o. female who presents for continued management of multiple myeloma.  1.  Nonsecretory multiple Myeloma, RISS Stage III  Labs upon initial presentation from 12/08/18, blood counts are normal including WBC at 7.1k, HGB at 13.1, and PLT at 245k. Calcium  normal at 10.3. Creatinine normal at 0.63. M spike at 0.5g. 12/13/18 Bone Scan revealed Multifocal uptake throughout the skeleton, consistent with diffuse metastatic disease. Primary tumor is not specified. 2. Uptake in the proximal right femur, consistent with lytic lesions. 3. Uptake in the ribs bilaterally as described. 4. Lesions in the proximal left humerus. 5. Diffuse uptake throughout the skull consistent with metastatic disease. 6. Right paramedian uptake at the manubrium.  12/13/18 CT Right Femur revealed Numerous lytic lesions involving the right femur and a lytic lesion in the left inferior pubic ramus. Overall appearance is most concerning for multiple myeloma  12/27/18 Pretreatment 24hour UPEP observed an M spike at 18mg , and showed 199mg  total protein/day.  12/27/18 Pretreatment MMP revealed M Protein at 0.5g with IgG Lambda specificity. Kappa:Lambda light chain ratio at 0.13, with Lambda at  40.3. There is less abnormal protein and light chains than I would expect from 30% plasma cells, which suggests hypo-secretory or non-secretory neoplastic plasma cells. Will have an impact in assessing response. 01/05/19 PET/CT revealed Innumerable lytic lesions in the skeleton compatible with myeloma. Most of the larger lesions are hypermetabolic, for example including a left proximal humeral shaft lesion with maximum SUV of 8.1 and a 2.8 cm lesion in the left T9 vertebral body with maximum SUV 5.1. Most of the smaller lytic lesions, and some of the larger lesions, do not demonstrate accentuated metabolic activity. 2. 1.2 cm in short axis lymph node in the left parapharyngeal space is hypermetabolic with maximum SUV 11.8. I do not see a separate mass in the head and neck to give rise to this hypermetabolic lymph node. 3. Mosaic attenuation in the lower lobes, nonspecific possibly from air trapping. 4.  Aortic Atherosclerosis 5. Heterogeneous activity in the liver, making it hard to exclude small liver lesions. Consider hepatic protocol  MRI with and without contrast for definitive assessment. Nonobstructive right nephrolithiasis. Old granulomatous disease  01/06/19 Bone Marrow biopsy revealed interstitial increase in plasma cells (28% aspirate, 40% CD138 immunohistochemistry). Plasma cells negative for light chains consistent with a non or weakly secretory myeloma   01/06/19 Cytogenetics revealed 37% of cells with trisomy 11 or 11q deletion, and 40.5% of cells with 17p mutation  S/p 5 cycles of KRD treatment  05/31/19 BM Biopsy revealed mild atypical plasmacytosis at 5% with polytypic variation.   06/01/19 PET/CT revealed Dominant lesion in the LEFT humerus is decreased significantly in metabolic activity. Additional hypermetabolic skeletal lytic lesions have decreased in metabolic activity or similar to comparison exam (01/05/2019). No evidence of disease progression. 2. Multiple additional lytic lesions do  not have metabolic activity and unchanged. 3. No new skeletal lesions are identified. No soft tissue plasmacytoma identified. 4. Nodule / node in the LEFT parapharyngeal space which is intensely hypermetabolic not changed from prior. 5. New hypermetabolic LEFT lower lobe pulmonary nodule is indeterminate. Recommend close attention on follow-up 6. New obstructive hydronephrosis of the RIGHT kidney related to RIGHT UPJ stone.  09/18/2019 BM Bx Report which revealed Slightly hypercellular bone marrow for age with trilineage hematopoiesis and 1% plasma cells.  09/14/2019 PET/CT Whole Body Scan ([id number]) which revealed 1. There widespread tiny lytic lesions compatible with multiple myeloma. Index larger lesions are generally similar to the prior exam, with low-grade activity such as the left T9 vertebral body lesion with maximum SUV 4.5. Is mild increase in the activity associated with a mildly sclerotic left proximal humeral lesion, maximum SUV 4.8 (previously 3.5). 2. At the site of the prior left lower lobe nodule is currently more bandlike thickening, with maximum SUV only 1.9, probably benign, continued surveillance of this region suggested. 3. There several small but hypermetabolic lymph nodes. This includes a left parapharyngeal space node measuring 1.0 cm with maximum SUV 12.3 (stable); a left level IB lymph node measuring 0.5 cm with maximum SUV 4.8 (slightly larger than prior); and a left inguinal lymph node measuring 0.7 cm in short axis with maximum SUV 6.4 (previously 0.5 cm with maximum SUV 0.6). Significance of these lymph nodes uncertain, surveillance is recommended. 4. New 5 mm left lower lobe subpleural nodule on image 32/8, not appreciably hypermetabolic, surveillance suggested. 5. Focal subcutaneous stranding along the left perineum measuring about 2.6 by 1.1 cm on image 221/4, maximum SUV 12.5. This was not present previously and is most likely inflammatory, although given the notable SUV,  surveillance of this region is suggested. 6. Other imaging findings of potential clinical significance: Aortic Atherosclerosis (ICD10-I70.0). Coronary atherosclerosis. Old granulomatous disease. Mild right hydronephrosis due to a 7 mm right UPJ calculus. 2 mm right kidney upper pole nonobstructive renal calculus. Prominent stool throughout the colon favors constipation.  12/19/2019 Thoracic & Lumbar Spine MRI ([id number]) ([id number]) revealed Suspected myeloma lesions at T9 and S1. No compression deformity or epidural disease.  01/18/2020 PET/CT ([id number]) which revealed 1. Stable lytic lesions throughout the skeleton. The larger lytic lesions which had mild metabolic activity on comparison exam now have background metabolic activity. No evidence of active myeloma. No evidence of progression multiple myeloma.  No plasmacytoma 3. Hypermetabolic nodules in the LEFT neck may be associated deep tissues of the LEFT parotid gland. 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  02/16/2023 Bone Marrow biopsy       2. Heterogeneous liver activity, as seen on 01/05/19 PET/CT Extra-medullary hematopoiesis vs  metabolic liver disease vs hepatic malignancy ?  01/17/19 MRI Liver revealed Several appreciable liver lesions all have benign imaging characteristics. No MRI findings of metastatic involvement of the liver. 2. Scattered bony lesions corresponding to the lytic lesions seen at PET-CT, compatible with active myeloma. 3. Aortic Atherosclerosis.  Mild cardiomegaly. 4. Diffuse hepatic steatosis.   3. Left lower lobe pulmonary nodule First seen on 06/01/19 PET/CT PET/CT 08/27/2020: No hypermetabolic mediastinal or hilar nodes. No suspicious pulmonary nodules on the CT scan.  4. 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 Being managed conservatively as per patient's  preference.  No symptoms from this currently.   PLAN: -Due for cycle 22, Day 15 of Carfilzomib  today -Reviewed labs from today were reviewed in detail with patient.  -continue maintenance Carfilzomib  and Revlimid  without any dose modifications. WBC 5.5, Hgb 13.9, MCV 98.6, Plt 171, creatinine and LFTs in range.  -Proceed with treatment without any dose modifications -Will hold Zometa  infusion at this time as patient is planning to undergo some dental work. Consider switching to Xgeva for better tolerance.   FOLLOW UP: Per integrated scheduling  All of the patient's questions were answered with apparent satisfaction. The patient knows to call the clinic with any problems, questions or concerns.  I have spent a total of 30 minutes minutes of face-to-face and non-face-to-face time, preparing to see the patient, performing a medically appropriate examination, counseling and educating the patient, documenting clinical information in the electronic health record,and care coordination.   Johnston Police PA-C Dept of Hematology and Oncology [facility] at [facility] Phone: [phone]

## 2024-10-20 ENCOUNTER — Inpatient Hospital Stay

## 2024-10-20 ENCOUNTER — Inpatient Hospital Stay: Attending: Hematology

## 2024-10-20 ENCOUNTER — Encounter: Payer: Self-pay | Admitting: Hematology

## 2024-10-20 ENCOUNTER — Inpatient Hospital Stay: Attending: Hematology | Admitting: Physician Assistant

## 2024-10-20 VITALS — BP 125/54 | HR 65 | Temp 97.7°F | Resp 16 | Ht 63.0 in | Wt 155.0 lb

## 2024-10-20 DIAGNOSIS — Z452 Encounter for adjustment and management of vascular access device: Secondary | ICD-10-CM | POA: Diagnosis not present

## 2024-10-20 DIAGNOSIS — Z5112 Encounter for antineoplastic immunotherapy: Secondary | ICD-10-CM | POA: Diagnosis present

## 2024-10-20 DIAGNOSIS — Z79899 Other long term (current) drug therapy: Secondary | ICD-10-CM | POA: Diagnosis not present

## 2024-10-20 DIAGNOSIS — Z7189 Other specified counseling: Secondary | ICD-10-CM

## 2024-10-20 DIAGNOSIS — C9000 Multiple myeloma not having achieved remission: Secondary | ICD-10-CM

## 2024-10-20 DIAGNOSIS — Z95828 Presence of other vascular implants and grafts: Secondary | ICD-10-CM

## 2024-10-20 LAB — CBC WITH DIFFERENTIAL (CANCER CENTER ONLY)
Abs Immature Granulocytes: 0.03 K/uL (ref 0.00–0.07)
Basophils Absolute: 0.1 K/uL (ref 0.0–0.1)
Basophils Relative: 2 %
Eosinophils Absolute: 0.1 K/uL (ref 0.0–0.5)
Eosinophils Relative: 1 %
HCT: 40.9 % (ref 36.0–46.0)
Hemoglobin: 13.9 g/dL (ref 12.0–15.0)
Immature Granulocytes: 1 %
Lymphocytes Relative: 19 %
Lymphs Abs: 1.1 K/uL (ref 0.7–4.0)
MCH: 33.5 pg (ref 26.0–34.0)
MCHC: 34 g/dL (ref 30.0–36.0)
MCV: 98.6 fL (ref 80.0–100.0)
Monocytes Absolute: 1.4 K/uL — ABNORMAL HIGH (ref 0.1–1.0)
Monocytes Relative: 25 %
Neutro Abs: 2.8 K/uL (ref 1.7–7.7)
Neutrophils Relative %: 52 %
Platelet Count: 171 K/uL (ref 150–400)
RBC: 4.15 MIL/uL (ref 3.87–5.11)
RDW: 13.6 % (ref 11.5–15.5)
WBC Count: 5.5 K/uL (ref 4.0–10.5)
nRBC: 0 % (ref 0.0–0.2)

## 2024-10-20 LAB — CMP (CANCER CENTER ONLY)
ALT: 14 U/L (ref 0–44)
AST: 14 U/L — ABNORMAL LOW (ref 15–41)
Albumin: 3.9 g/dL (ref 3.5–5.0)
Alkaline Phosphatase: 95 U/L (ref 38–126)
Anion gap: 7 (ref 5–15)
BUN: 13 mg/dL (ref 8–23)
CO2: 24 mmol/L (ref 22–32)
Calcium: 10 mg/dL (ref 8.9–10.3)
Chloride: 108 mmol/L (ref 98–111)
Creatinine: 0.96 mg/dL (ref 0.44–1.00)
GFR, Estimated: 59 mL/min — ABNORMAL LOW (ref >60)
Glucose, Bld: 237 mg/dL — ABNORMAL HIGH (ref 70–99)
Potassium: 4.1 mmol/L (ref 3.5–5.1)
Sodium: 139 mmol/L (ref 135–145)
Total Bilirubin: 0.6 mg/dL (ref 0.0–1.2)
Total Protein: 6.3 g/dL — ABNORMAL LOW (ref 6.5–8.1)

## 2024-10-20 MED ORDER — FAMOTIDINE IN NACL 20-0.9 MG/50ML-% IV SOLN
20.0000 mg | Freq: Once | INTRAVENOUS | Status: AC
  Administered 2024-10-20: 20 mg via INTRAVENOUS
  Filled 2024-10-20: qty 50

## 2024-10-20 MED ORDER — PROCHLORPERAZINE MALEATE 10 MG PO TABS
10.0000 mg | ORAL_TABLET | Freq: Once | ORAL | Status: AC
  Administered 2024-10-20: 10 mg via ORAL
  Filled 2024-10-20: qty 1

## 2024-10-20 MED ORDER — SODIUM CHLORIDE 0.9 % IV SOLN: Freq: Once | INTRAVENOUS | Status: AC

## 2024-10-20 MED ORDER — ALTEPLASE 2 MG IJ SOLR
2.0000 mg | Freq: Once | INTRAMUSCULAR | Status: AC
  Administered 2024-10-20: 2 mg

## 2024-10-20 MED ORDER — ACETAMINOPHEN 325 MG PO TABS
650.0000 mg | ORAL_TABLET | Freq: Once | ORAL | Status: AC
  Administered 2024-10-20: 650 mg via ORAL
  Filled 2024-10-20: qty 2

## 2024-10-20 MED ORDER — DEXTROSE 5 % IV SOLN
36.0000 mg/m2 | Freq: Once | INTRAVENOUS | Status: AC
  Administered 2024-10-20: 60 mg via INTRAVENOUS
  Filled 2024-10-20: qty 30

## 2024-10-20 MED ORDER — DEXAMETHASONE 4 MG PO TABS
8.0000 mg | ORAL_TABLET | Freq: Once | ORAL | Status: AC
  Administered 2024-10-20: 8 mg via ORAL
  Filled 2024-10-20: qty 2

## 2024-10-20 MED ORDER — DIPHENHYDRAMINE HCL 25 MG PO CAPS
25.0000 mg | ORAL_CAPSULE | Freq: Once | ORAL | Status: AC
  Administered 2024-10-20: 25 mg via ORAL
  Filled 2024-10-20: qty 1

## 2024-10-20 NOTE — Patient Instructions (Signed)
 [facility] - A DEPT OF [facility]  Discharge Instructions: Thank you for choosing [facility] to provide your oncology and hematology care.   If you have a lab appointment with the Cancer Center, please go directly to the Cancer Center and check in at the registration area.   Wear comfortable clothing and clothing appropriate for easy access to any Portacath or PICC line.   We strive to give you quality time with your provider. You may need to reschedule your appointment if you arrive late (15 or more minutes).  Arriving late affects you and other patients whose appointments are after yours.  Also, if you miss three or more appointments without notifying the office, you may be dismissed from the clinic at the provider's discretion.      For prescription refill requests, have your pharmacy contact our office and allow 72 hours for refills to be completed.    Today you received the following chemotherapy and/or immunotherapy agent: Kyprolis    To help prevent nausea and vomiting after your treatment, we encourage you to take your nausea medication as directed.  BELOW ARE SYMPTOMS THAT SHOULD BE REPORTED IMMEDIATELY: *FEVER GREATER THAN 100.4 F (38 C) OR HIGHER *CHILLS OR SWEATING *NAUSEA AND VOMITING THAT IS NOT CONTROLLED WITH YOUR NAUSEA MEDICATION *UNUSUAL SHORTNESS OF BREATH *UNUSUAL BRUISING OR BLEEDING *URINARY PROBLEMS (pain or burning when urinating, or frequent urination) *BOWEL PROBLEMS (unusual diarrhea, constipation, pain near the anus) TENDERNESS IN MOUTH AND THROAT WITH OR WITHOUT PRESENCE OF ULCERS (sore throat, sores in mouth, or a toothache) UNUSUAL RASH, SWELLING OR PAIN  UNUSUAL VAGINAL DISCHARGE OR ITCHING   Items with * indicate a potential emergency and should be followed up as soon as possible or go to the Emergency Department if any problems should occur.  Please show the CHEMOTHERAPY ALERT CARD or IMMUNOTHERAPY ALERT  CARD at check-in to the Emergency Department and triage nurse.  Should you have questions after your visit or need to cancel or reschedule your appointment, please contact [facility] - A DEPT OF [facility]  Dept: [phone]  and follow the prompts.  Office hours are 8:00 a.m. to 4:30 p.m. Monday - Friday. Please note that voicemails left after 4:00 p.m. may not be returned until the following business day.  We are closed weekends and major holidays. You have access to a nurse at all times for urgent questions. Please call the main number to the clinic Dept: [phone] and follow the prompts.   For any non-urgent questions, you may also contact your provider using MyChart. We now offer e-Visits for anyone 7 and older to request care online for non-urgent symptoms. For details visit [email or web address].   Also download the MyChart app! Go to the app store, search MyChart, open the app, select [place], and log in with your MyChart username and password.

## 2024-11-03 ENCOUNTER — Other Ambulatory Visit: Payer: Self-pay

## 2024-11-03 ENCOUNTER — Inpatient Hospital Stay

## 2024-11-03 ENCOUNTER — Other Ambulatory Visit: Payer: Self-pay | Admitting: Hematology

## 2024-11-03 VITALS — BP 147/67 | HR 74 | Temp 97.6°F | Resp 16 | Wt 156.0 lb

## 2024-11-03 DIAGNOSIS — Z5112 Encounter for antineoplastic immunotherapy: Secondary | ICD-10-CM | POA: Diagnosis not present

## 2024-11-03 DIAGNOSIS — C9000 Multiple myeloma not having achieved remission: Secondary | ICD-10-CM

## 2024-11-03 DIAGNOSIS — Z7189 Other specified counseling: Secondary | ICD-10-CM

## 2024-11-03 LAB — CBC WITH DIFFERENTIAL (CANCER CENTER ONLY)
Abs Immature Granulocytes: 0.02 K/uL (ref 0.00–0.07)
Basophils Absolute: 0.1 K/uL (ref 0.0–0.1)
Basophils Relative: 1 %
Eosinophils Absolute: 0.2 K/uL (ref 0.0–0.5)
Eosinophils Relative: 4 %
HCT: 40.3 % (ref 36.0–46.0)
Hemoglobin: 13.9 g/dL (ref 12.0–15.0)
Immature Granulocytes: 0 %
Lymphocytes Relative: 24 %
Lymphs Abs: 1.2 K/uL (ref 0.7–4.0)
MCH: 33.6 pg (ref 26.0–34.0)
MCHC: 34.5 g/dL (ref 30.0–36.0)
MCV: 97.3 fL (ref 80.0–100.0)
Monocytes Absolute: 0.9 K/uL (ref 0.1–1.0)
Monocytes Relative: 19 %
Neutro Abs: 2.7 K/uL (ref 1.7–7.7)
Neutrophils Relative %: 52 %
Platelet Count: 147 K/uL — ABNORMAL LOW (ref 150–400)
RBC: 4.14 MIL/uL (ref 3.87–5.11)
RDW: 13.6 % (ref 11.5–15.5)
WBC Count: 5 K/uL (ref 4.0–10.5)
nRBC: 0 % (ref 0.0–0.2)

## 2024-11-03 LAB — CMP (CANCER CENTER ONLY)
ALT: 18 U/L (ref 0–44)
AST: 19 U/L (ref 15–41)
Albumin: 4.1 g/dL (ref 3.5–5.0)
Alkaline Phosphatase: 92 U/L (ref 38–126)
Anion gap: 14 (ref 5–15)
BUN: 11 mg/dL (ref 8–23)
CO2: 21 mmol/L — ABNORMAL LOW (ref 22–32)
Calcium: 10.1 mg/dL (ref 8.9–10.3)
Chloride: 105 mmol/L (ref 98–111)
Creatinine: 0.92 mg/dL (ref 0.44–1.00)
GFR, Estimated: >60 mL/min (ref >60)
Glucose, Bld: 287 mg/dL — ABNORMAL HIGH (ref 70–99)
Potassium: 3.7 mmol/L (ref 3.5–5.1)
Sodium: 139 mmol/L (ref 135–145)
Total Bilirubin: 0.7 mg/dL (ref 0.0–1.2)
Total Protein: 6.3 g/dL — ABNORMAL LOW (ref 6.5–8.1)

## 2024-11-03 MED ORDER — DIPHENHYDRAMINE HCL 25 MG PO CAPS
25.0000 mg | ORAL_CAPSULE | Freq: Once | ORAL | Status: AC
  Administered 2024-11-03: 25 mg via ORAL
  Filled 2024-11-03: qty 1

## 2024-11-03 MED ORDER — SODIUM CHLORIDE 0.9 % IV SOLN: Freq: Once | INTRAVENOUS | Status: AC

## 2024-11-03 MED ORDER — ACETAMINOPHEN 325 MG PO TABS
650.0000 mg | ORAL_TABLET | Freq: Once | ORAL | Status: AC
  Administered 2024-11-03: 650 mg via ORAL
  Filled 2024-11-03: qty 2

## 2024-11-03 MED ORDER — OXYCODONE HCL 10 MG PO TABS: 10.0000 mg | ORAL_TABLET | Freq: Four times a day (QID) | ORAL | 0 refills | Status: DC | PRN

## 2024-11-03 MED ORDER — DEXTROSE 5 % IV SOLN
36.0000 mg/m2 | Freq: Once | INTRAVENOUS | Status: AC
  Administered 2024-11-03: 60 mg via INTRAVENOUS
  Filled 2024-11-03: qty 30

## 2024-11-03 MED ORDER — FAMOTIDINE IN NACL 20-0.9 MG/50ML-% IV SOLN
20.0000 mg | Freq: Once | INTRAVENOUS | Status: AC
  Administered 2024-11-03: 20 mg via INTRAVENOUS
  Filled 2024-11-03: qty 50

## 2024-11-03 MED ORDER — LENALIDOMIDE 5 MG PO CAPS
ORAL_CAPSULE | ORAL | 0 refills | Status: AC
Start: 2024-11-03 — End: ?

## 2024-11-03 MED ORDER — PROCHLORPERAZINE MALEATE 10 MG PO TABS
10.0000 mg | ORAL_TABLET | Freq: Once | ORAL | Status: AC
  Administered 2024-11-03: 10 mg via ORAL
  Filled 2024-11-03: qty 1

## 2024-11-03 MED ORDER — DEXAMETHASONE 4 MG PO TABS
8.0000 mg | ORAL_TABLET | Freq: Once | ORAL | Status: AC
  Administered 2024-11-03: 8 mg via ORAL
  Filled 2024-11-03: qty 2

## 2024-11-03 MED ORDER — SODIUM CHLORIDE 0.9 % IV SOLN: Freq: Once | INTRAVENOUS | Status: DC

## 2024-11-03 NOTE — Patient Instructions (Signed)

## 2024-11-03 NOTE — Progress Notes (Signed)
 Patient reports that she had an episode of vertigo approximately 10 days ago which lasted for 5 days,  after 2 days it became more intermittent. She took meclizine  twice which helped. She denies any dizziness currently. Dr Onesimo informed.

## 2024-11-15 ENCOUNTER — Other Ambulatory Visit: Payer: Self-pay | Admitting: Physician Assistant

## 2024-11-15 DIAGNOSIS — C9000 Multiple myeloma not having achieved remission: Secondary | ICD-10-CM

## 2024-11-16 ENCOUNTER — Encounter: Payer: Self-pay | Admitting: Hematology

## 2024-11-17 ENCOUNTER — Inpatient Hospital Stay: Attending: Hematology | Admitting: Hematology

## 2024-11-17 ENCOUNTER — Inpatient Hospital Stay: Attending: Hematology

## 2024-11-17 ENCOUNTER — Inpatient Hospital Stay

## 2024-11-17 ENCOUNTER — Encounter: Payer: Self-pay | Admitting: Hematology

## 2024-11-17 VITALS — BP 119/57 | HR 60 | Temp 98.2°F | Resp 16 | Ht 63.0 in | Wt 153.5 lb

## 2024-11-17 DIAGNOSIS — Z7189 Other specified counseling: Secondary | ICD-10-CM

## 2024-11-17 DIAGNOSIS — C9000 Multiple myeloma not having achieved remission: Secondary | ICD-10-CM

## 2024-11-17 DIAGNOSIS — Z79899 Other long term (current) drug therapy: Secondary | ICD-10-CM | POA: Insufficient documentation

## 2024-11-17 DIAGNOSIS — Z5112 Encounter for antineoplastic immunotherapy: Secondary | ICD-10-CM | POA: Insufficient documentation

## 2024-11-17 LAB — CMP (CANCER CENTER ONLY)
ALT: 16 U/L (ref 0–44)
AST: 18 U/L (ref 15–41)
Albumin: 4.1 g/dL (ref 3.5–5.0)
Alkaline Phosphatase: 89 U/L (ref 38–126)
Anion gap: 10 (ref 5–15)
BUN: 16 mg/dL (ref 8–23)
CO2: 22 mmol/L (ref 22–32)
Calcium: 10 mg/dL (ref 8.9–10.3)
Chloride: 108 mmol/L (ref 98–111)
Creatinine: 0.85 mg/dL (ref 0.44–1.00)
GFR, Estimated: >60 mL/min (ref >60)
Glucose, Bld: 196 mg/dL — ABNORMAL HIGH (ref 70–99)
Potassium: 4.3 mmol/L (ref 3.5–5.1)
Sodium: 140 mmol/L (ref 135–145)
Total Bilirubin: 0.6 mg/dL (ref 0.0–1.2)
Total Protein: 6.2 g/dL — ABNORMAL LOW (ref 6.5–8.1)

## 2024-11-17 LAB — CBC WITH DIFFERENTIAL (CANCER CENTER ONLY)
Abs Immature Granulocytes: 0.01 K/uL (ref 0.00–0.07)
Basophils Absolute: 0.1 K/uL (ref 0.0–0.1)
Basophils Relative: 1 %
Eosinophils Absolute: 0 K/uL (ref 0.0–0.5)
Eosinophils Relative: 0 %
HCT: 39 % (ref 36.0–46.0)
Hemoglobin: 13.4 g/dL (ref 12.0–15.0)
Immature Granulocytes: 0 %
Lymphocytes Relative: 22 %
Lymphs Abs: 1 K/uL (ref 0.7–4.0)
MCH: 33.4 pg (ref 26.0–34.0)
MCHC: 34.4 g/dL (ref 30.0–36.0)
MCV: 97.3 fL (ref 80.0–100.0)
Monocytes Absolute: 0.9 K/uL (ref 0.1–1.0)
Monocytes Relative: 20 %
Neutro Abs: 2.6 K/uL (ref 1.7–7.7)
Neutrophils Relative %: 57 %
Platelet Count: 179 K/uL (ref 150–400)
RBC: 4.01 MIL/uL (ref 3.87–5.11)
RDW: 13.4 % (ref 11.5–15.5)
WBC Count: 4.6 K/uL (ref 4.0–10.5)
nRBC: 0 % (ref 0.0–0.2)

## 2024-11-17 MED ORDER — DIPHENHYDRAMINE HCL 25 MG PO CAPS
25.0000 mg | ORAL_CAPSULE | Freq: Once | ORAL | Status: AC
  Administered 2024-11-17: 25 mg via ORAL
  Filled 2024-11-17: qty 1

## 2024-11-17 MED ORDER — DEXAMETHASONE 4 MG PO TABS
8.0000 mg | ORAL_TABLET | Freq: Once | ORAL | Status: AC
  Administered 2024-11-17: 8 mg via ORAL
  Filled 2024-11-17: qty 2

## 2024-11-17 MED ORDER — DEXTROSE 5 % IV SOLN
36.0000 mg/m2 | Freq: Once | INTRAVENOUS | Status: AC
  Administered 2024-11-17: 60 mg via INTRAVENOUS
  Filled 2024-11-17: qty 30

## 2024-11-17 MED ORDER — SODIUM CHLORIDE 0.9 % IV SOLN: Freq: Once | INTRAVENOUS | Status: AC

## 2024-11-17 MED ORDER — FAMOTIDINE IN NACL 20-0.9 MG/50ML-% IV SOLN
20.0000 mg | Freq: Once | INTRAVENOUS | Status: AC
  Administered 2024-11-17: 20 mg via INTRAVENOUS
  Filled 2024-11-17: qty 50

## 2024-11-17 MED ORDER — PROCHLORPERAZINE MALEATE 10 MG PO TABS
10.0000 mg | ORAL_TABLET | Freq: Once | ORAL | Status: AC
  Administered 2024-11-17: 10 mg via ORAL

## 2024-11-17 MED ORDER — ACETAMINOPHEN 325 MG PO TABS
650.0000 mg | ORAL_TABLET | Freq: Once | ORAL | Status: AC
  Administered 2024-11-17: 650 mg via ORAL
  Filled 2024-11-17: qty 2

## 2024-11-17 MED ORDER — SODIUM CHLORIDE 0.9 % IV SOLN: Freq: Once | INTRAVENOUS | Status: DC

## 2024-11-17 NOTE — Progress Notes (Incomplete)
 HEMATOLOGY ONCOLOGY PROGRESS NOTE  Date of service: 11/17/2024  Patient Care Team: Rollene Almarie LABOR, MD as PCP - General (Internal Medicine) Obie Princella HERO, MD (Inactive) as Consulting Physician (Gastroenterology) Cleotilde Ronal RAMAN, MD as Consulting Physician (Gynecology) Sheril Coy, MD as Consulting Physician (Orthopedic Surgery) Neysa Reggy BIRCH, MD as Consulting Physician (Pulmonary Disease) Cleatus Collar, MD (Ophthalmology)  CHIEF COMPLAINT/PURPOSE OF CONSULTATION: Follow-up for continued evaluation and management of ***  HISTORY OF PRESENTING ILLNESS:    SUMMARY OF ONCOLOGIC HISTORY: Oncology History  Multiple myeloma (HCC)  01/16/2019 Initial Diagnosis   Multiple myeloma (HCC)   01/18/2019 - 01/25/2019 Chemotherapy   The patient had bortezomib  SQ (VELCADE ) chemo injection 2.5 mg, 1.3 mg/m2 = 2.5 mg, Subcutaneous,  Once, 1 of 4 cycles Administration: 2.5 mg (01/18/2019), 2.5 mg (01/25/2019)  for chemotherapy treatment.    02/02/2019 - 02/12/2023 Chemotherapy   Patient is on Treatment Plan : MYELOMA SALVAGE Carfilzomib  / Dexamethasone  q28d     02/26/2023 -  Chemotherapy   Patient is on Treatment Plan : MYELOMA MAINTENANCE Carfilzomib  (70) + Lenalidomide  q28d      {ELGKCycle/Day (Optional):34084}  INTERVAL HISTORY: Faith Orr is a 83 y.o. female who is here today for continued evaluation and management of ***. {ELcompanions/ambulations (Optional):33762}  she was last seen by me on 09/07/2024; at the time she {Concerns:31667}  Today, she  Says that she's feeling yucky   She has experienced pain in hip attributed to zometa , hasn't experienced this before About to have a procedure Extremely fatigued, hasn't more active than usual, fluctuates After treatment, fatigue worsens and lingers  She generally isn't physically active, does household work.   No fever, cough  Sleeping okay, eating/hydrating well  No new medications  Jerking in her legs has  stopped, which is a relief for her as it was affecting her sleep.   UTD on Flu, not Covid-19.   Denies any  [x]  new infection issues,  []  fevers/chills,  []  drenching night sweats,  []  unexpected weight change,  []  back pain,  []  chest pain,  []  abdominal pain,  []  new bone pains,  []  new lumps/bumps,  []  leg swelling,  []  bleeding issues (nose bleeds, gum bleeds, abnormal/spontaneous bruising),  [x]  nausea/vomiting,  [x]  diarrhea,  []  bowel/urinary changes, []  SOB,  []  change in breathing   She states that she will be going to the [employer] for Christmas.   Notes noticing some cognitive changes, not much memory, but has noticed after a change she has difficulty resequencing. Recently obtained an upright piano, which she is happy about as she enjoys playing. She notes that she feels like this is mentally stimulating as she is having to recall musical technicalities.   - Could repeat BMBX in a couple of months to look at MRD status to determine whether medication could be reduced or discontinued.   - Briefly reviewed 10/03/2024 PET scan:   REVIEW OF SYSTEMS:   10 Point review of systems of done and is negative except as noted above.  MEDICAL HISTORY Past Medical History:  Diagnosis Date   Allergy    seasonal   Asthma    DEPRESSION    DIABETES MELLITUS, TYPE II    Diverticulosis    HYPERLIPIDEMIA    Macular degeneration of left eye    mild, Dr.Hecker   Obesity, unspecified    Osteoarthritis of both knees    OSTEOPENIA    Osteopenia    URINARY INCONTINENCE     SURGICAL HISTORY  Past Surgical History:  Procedure Laterality Date   CATARACT EXTRACTION Left 05/24/2018   CESAREAN SECTION  01/1973   CYSTOSCOPY/URETEROSCOPY/HOLMIUM LASER/STENT PLACEMENT Right 09/20/2019   Procedure: CYSTOSCOPY/URETEROSCOPY/HOLMIUM LASER/STENT PLACEMENT;  Surgeon: Carolee Sherwood JONETTA DOUGLAS, MD;  Location: [facility];  Service: Urology;  Laterality: Right;   FRACTURE SURGERY      IR BONE MARROW BIOPSY & ASPIRATION  02/16/2023   IR IMAGING GUIDED PORT INSERTION  02/20/2019   left wrist surgery  2008   By Dr. Lilly   right ankle  1994    SOCIAL HISTORY Social History   Tobacco Use   Smoking status: Never   Smokeless tobacco: Never   Tobacco comments:    Lives with partner Charlann Irving) and son  Vaping Use   Vaping status: Never Used  Substance Use Topics   Alcohol use: No    Alcohol/week: 0.0 standard drinks of alcohol   Drug use: No    Social History   Social History Narrative   Married    SOCIAL DRIVERS OF HEALTH SDOH Screenings   Food Insecurity: No Food Insecurity (09/12/2024)  Housing: Unknown (09/12/2024)  Transportation Needs: No Transportation Needs (09/12/2024)  Utilities: Not At Risk (09/12/2024)  Alcohol Screen: Low Risk  (09/12/2024)  Depression (PHQ2-9): Medium Risk (10/20/2024)  Financial Resource Strain: Low Risk  (09/12/2024)  Physical Activity: Inactive (09/12/2024)  Social Connections: Socially Integrated (09/12/2024)  Stress: No Stress Concern Present (09/12/2024)  Tobacco Use: Low Risk  (09/12/2024)  Health Literacy: Adequate Health Literacy (09/12/2024)     FAMILY HISTORY Family History  Problem Relation Age of Onset   Diabetes Father    Hyperlipidemia Father    Heart disease Father    Cancer Father    Hypertension Father    Colon cancer Paternal Grandmother 37   Osteoporosis Mother    Protein S deficiency Mother    Hyperlipidemia Mother    Multiple sclerosis Daughter    Cancer Other        bladder   Breast cancer Neg Hx      ALLERGIES: is allergic to levofloxacin , penicillins, aleve [naproxen sodium], and sulfonamide derivatives.  MEDICATIONS  Current Outpatient Medications  Medication Sig Dispense Refill   acyclovir  (ZOVIRAX ) 400 MG tablet TAKE 1 TABLET(400 MG) BY MOUTH TWICE DAILY 60 tablet 5   Blood Glucose Monitoring Suppl (FREESTYLE FREEDOM LITE) W/DEVICE KIT Use to check blood sugars twice a day Dx  250.00 1 each 0   Calcium  Carbonate-Vitamin D  600-400 MG-UNIT tablet Take 1 tablet by mouth 2 (two) times daily.     dexamethasone  (DECADRON ) 4 MG tablet Take 5 tablets (20 mg total) by mouth once a week. On D22 of each cycle of treatment 20 tablet 5   ELIQUIS  2.5 MG TABS tablet TAKE 1 TABLET(2.5 MG) BY MOUTH TWICE DAILY 60 tablet 2   fentaNYL  (DURAGESIC ) 12 MCG/HR Place 1 patch onto the skin every 3 (three) days. (Patient not taking: Reported on 09/12/2024) 15 patch 0   fluticasone  (FLONASE ) 50 MCG/ACT nasal spray Place 1 spray into both nostrils daily. 48 g 3   gabapentin  (NEURONTIN ) 100 MG capsule Take 1 capsule (100 mg total) by mouth 2 (two) times daily. (Patient not taking: Reported on 09/12/2024) 60 capsule 0   glucose blood (FREESTYLE LITE) test strip CHECK BLOOD SUGAR TWICE DAILY AS DIRECTED Dx 250.00 180 each 3   Lancets (FREESTYLE) lancets Use twice daily to check sugars. 100 each 11   lenalidomide  (REVLIMID ) 5 MG capsule Take 1  capsule (5 mg total) by mouth daily for 21 days. Take 7 days off. Repeat cycle 21 capsule 0   lidocaine  (LIDODERM ) 5 % Place 1 patch onto the skin daily. Remove & Discard patch within 12 hours or as directed by MD 30 patch 0   lidocaine -prilocaine  (EMLA ) cream APPLY 1 APPLICATION TO THE AFFECTED AREA AS NEEDED. USE PRIOR TO PORT ACCESS (Patient not taking: Reported on 09/12/2024) 30 g 0   meclizine  (ANTIVERT ) 25 MG tablet Take 1 tablet (25 mg total) by mouth every 8 (eight) hours as needed for dizziness. 20 tablet 0   metFORMIN  (GLUCOPHAGE -XR) 500 MG 24 hr tablet TAKE 1 TABLET(500 MG) BY MOUTH TWICE DAILY WITH A MEAL 180 tablet 3   metroNIDAZOLE  (METROGEL ) 1 % gel Apply topically in the morning and at bedtime. 60 g 1   Multiple Vitamins-Minerals (ICAPS) CAPS Take 1 capsule by mouth daily after breakfast.     ondansetron  (ZOFRAN ) 8 MG tablet TAKE 1 TABLET(8 MG) BY MOUTH TWICE DAILY AS NEEDED FOR NAUSEA OR VOMITING 30 tablet 1   Oxycodone  HCl 10 MG TABS Take 1 tablet  (10 mg total) by mouth every 6 (six) hours as needed. 90 tablet 0   pantoprazole  (PROTONIX ) 20 MG tablet TAKE 1 TABLET(20 MG) BY MOUTH DAILY 30 tablet 2   polyethylene glycol (MIRALAX / GLYCOLAX) packet Take 17 g by mouth daily after breakfast.     potassium chloride  SA (KLOR-CON  M) 20 MEQ tablet TAKE 2 TABLETS(40 MEQ) BY MOUTH TWICE DAILY 360 tablet 1   prochlorperazine  (COMPAZINE ) 10 MG tablet Take 1 tablet (10 mg total) by mouth every 6 (six) hours as needed (Nausea or vomiting). (Patient not taking: Reported on 09/12/2024) 30 tablet 1   senna-docusate (SENNA S) 8.6-50 MG tablet Take 2 tablets by mouth at bedtime. 60 tablet 2   sertraline  (ZOLOFT ) 100 MG tablet TAKE 1 TABLET(100 MG) BY MOUTH DAILY 90 tablet 3   simvastatin  (ZOCOR ) 20 MG tablet TAKE 1 TABLET(20 MG) BY MOUTH DAILY AT 6 PM 90 tablet 3   tizanidine  (ZANAFLEX ) 2 MG capsule Take 1 capsule (2 mg total) by mouth in the morning and at bedtime. (Patient not taking: Reported on 09/12/2024) 20 capsule 0   Vitamin D , Ergocalciferol , (DRISDOL ) 1.25 MG (50000 UNIT) CAPS capsule TAKE 1 CAPSULE BY MOUTH EVERY 7 DAYS 12 capsule 0   No current facility-administered medications for this visit.   Facility-Administered Medications Ordered in Other Visits  Medication Dose Route Frequency Provider Last Rate Last Admin   0.9 %  sodium chloride  infusion   Intravenous Once Kale, Gautam Kishore, MD       carfilzomib  (KYPROLIS ) 60 mg in dextrose  5 % 100 mL chemo infusion  36 mg/m2 (Treatment Plan Recorded) Intravenous Once Kale, Gautam Kishore, MD       heparin  lock flush 100 unit/mL  500 Units Intracatheter Once PRN Kale, Gautam Kishore, MD       prochlorperazine  (COMPAZINE ) tablet 10 mg  10 mg Oral Once Kale, Gautam Kishore, MD       sodium chloride  flush (NS) 0.9 % injection 10 mL  10 mL Intracatheter PRN Kale, Gautam Kishore, MD   10 mL at 12/17/23 1336    PHYSICAL EXAMINATION: ECOG PERFORMANCE STATUS: {CHL ONC ECOG ED:[id number]} VITALS: There  were no vitals filed for this visit. There were no vitals filed for this visit. There is no height or weight on file to calculate BMI.  GENERAL: alert, in no acute distress and comfortable SKIN:  no acute rashes, no significant lesions EYES: conjunctiva are pink and non-injected, sclera anicteric OROPHARYNX: MMM, no exudates, no oropharyngeal erythema or ulceration NECK: supple, no JVD LYMPH:  no palpable lymphadenopathy in the cervical, axillary or inguinal regions LUNGS: clear to auscultation b/l with normal respiratory effort HEART: regular rate & rhythm ABDOMEN:  normoactive bowel sounds , non tender, not distended, no hepatosplenomegaly Extremity: no pedal edema PSYCH: alert & oriented x 3 with fluent speech NEURO: no focal motor/sensory deficits  LABORATORY DATA:   I have reviewed the data as listed     Latest Ref Rng & Units 11/17/2024    1:17 PM 11/03/2024   11:43 AM 10/20/2024   10:28 AM  CBC EXTENDED  WBC 4.0 - 10.5 K/uL 4.6  5.0  5.5   RBC 3.87 - 5.11 MIL/uL 4.01  4.14  4.15   Hemoglobin 12.0 - 15.0 g/dL 86.5  86.0  86.0   HCT 36.0 - 46.0 % 39.0  40.3  40.9   Platelets 150 - 400 K/uL 179  147  171   NEUT# 1.7 - 7.7 K/uL 2.6  2.7  2.8   Lymph# 0.7 - 4.0 K/uL 1.0  1.2  1.1     {ELAddOncLabs (Optional):33736}     Latest Ref Rng & Units 11/17/2024    1:17 PM 11/03/2024   11:43 AM 10/20/2024   10:28 AM  CMP  Glucose 70 - 99 mg/dL 803  712  762   BUN 8 - 23 mg/dL 16  11  13    Creatinine 0.44 - 1.00 mg/dL 9.14  9.07  9.03   Sodium 135 - 145 mmol/L 140  139  139   Potassium 3.5 - 5.1 mmol/L 4.3  3.7  4.1   Chloride 98 - 111 mmol/L 108  105  108   CO2 22 - 32 mmol/L 22  21  24    Calcium  8.9 - 10.3 mg/dL 89.9  89.8  89.9   Total Protein 6.5 - 8.1 g/dL 6.2  6.3  6.3   Total Bilirubin 0.0 - 1.2 mg/dL 0.6  0.7  0.6   Alkaline Phos 38 - 126 U/L 89  92  95   AST 15 - 41 U/L 18  19  14    ALT 0 - 44 U/L 16  18  14       RADIOGRAPHIC STUDIES: I have personally reviewed  the radiological images as listed and agreed with the findings in the report. NM PET Image Restage (PS) Whole Body Result Date: 10/05/2024 CLINICAL DATA:  Subsequent treatment strategy for multiple myeloma. EXAM: NUCLEAR MEDICINE PET WHOLE BODY TECHNIQUE: 7.66 mCi F-18 FDG was injected intravenously. Full-ring PET imaging was performed from the head to foot after the radiotracer. CT data was obtained and used for attenuation correction and anatomic localization. Fasting blood glucose: 135 mg/dl COMPARISON:  PET-CT 97/89/7974 and 10/14/2023. CT of the abdomen and pelvis 12/31/2023. FINDINGS: Mediastinal blood pool activity: SUV max 2.6 HEAD/ NECK: No significant change in hypermetabolic left neck nodules. In the left parapharyngeal space, there is a 9 mm nodule on image 41/4 which has an SUV max of 9.6 (previously 10.7). There is a left level 2 node measuring 10 mm on image 51/4 with an SUV max of 19.0 (previously 17.9). No new nodules are identified. No suspicious activity identified within the pharyngeal mucosal space. Incidental CT findings: none CHEST: There are no hypermetabolic mediastinal, hilar or axillary lymph nodes. No hypermetabolic pulmonary activity or suspicious nodularity. Incidental CT findings: Right  IJ Port-A-Cath extends to the superior cavoatrial junction. There is atherosclerosis of the aorta, great vessels and coronary arteries with calcifications of the aortic valve. There are calcified left hilar lymph nodes the left lung granuloma. Underlying mild centrilobular emphysema with stable clustered noncalcified nodules in the lingula (image 25/7). ABDOMEN/PELVIS: There is no hypermetabolic activity within the liver, adrenal glands, spleen or pancreas. There is no hypermetabolic nodal activity in the abdomen or pelvis. Incidental CT findings: Calcified splenic granulomas. Mild aortoiliac atherosclerosis. SKELETON: Scattered small foci of hypermetabolic activity are demonstrated within the  thoracolumbar spine, similar to the previous study. The greatest uptake is present anteriorly in the L3 vertebral body (SUV max 5.4, previously 4.2). This corresponds with osteophytes, and no enlarging lucent lesions or new hypermetabolic osseous lesions are seen. Widespread lucencies are again noted within the spine and bony pelvis, likely due to treated myeloma. Incidental CT findings: Old right-sided rib fractures. EXTREMITIES: No hypermetabolic activity within the extremities to suggest active multiple myeloma. Incidental CT findings: Iliofemoral atherosclerosis. IMPRESSION: 1. No significant change in hypermetabolic left neck nodules, likely incidental parotid lesions. Lymph nodes considered less likely. 2. No definite evidence of metabolically active multiple myeloma. Grossly stable treated osseous lesions with scattered lucencies in the axial skeleton. 3. Stable scattered small foci of hypermetabolic activity within the thoracolumbar spine, likely degenerative. No new hypermetabolic osseous lesions identified. 4. Aortic Atherosclerosis (ICD10-I70.0) and Emphysema (ICD10-J43.9). Electronically Signed   By: Elsie Perone M.D.   On: 10/05/2024 16:23    ASSESSMENT & PLAN:  83 y.o. female with    PLAN: - Discussed lab results on 11/17/2024 in detail with patient: CBC showed {ELGKCBC:33763} CMP with Creatinine *** {ELIncreased/Decreased:33631} from *** and Calcium  *** {ELIncreased/Decreased:33631} from ***.  {ELGKMyeloma&Lightchains (Optional):33764}  FOLLOW-UP in {WEEKS/MONTHS:30939} for labs and follow-up with Dr. Onesimo.  The total time spent in the appointment was *** minutes* .  All of the patient's questions were answered and the patient knows to call the clinic with any problems, questions, or concerns.  Emaline Onesimo MD MS AAHIVMS [facility] Hematology/Oncology Physician [facility]  *Total Encounter Time as defined by the Centers for Medicare and Medicaid Services includes,  in addition to the face-to-face time of a patient visit (documented in the note above) non-face-to-face time: obtaining and reviewing outside history, ordering and reviewing medications, tests or procedures, care coordination (communications with other health care professionals or caregivers) and documentation in the medical record.  I,Emily Lagle,acting as a neurosurgeon for Emaline Onesimo, MD.,have documented all relevant documentation on the behalf of Emaline Onesimo, MD,as directed by  Emaline Onesimo, MD while in the presence of Emaline Onesimo, MD.  I have reviewed the above documentation for accuracy and completeness, and I agree with the above.  Gautam Kale, MD

## 2024-11-17 NOTE — Patient Instructions (Signed)

## 2024-11-23 ENCOUNTER — Encounter: Payer: Self-pay | Admitting: Hematology

## 2024-11-23 NOTE — Progress Notes (Signed)
 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 female who has been referred to us  by Dr. Almarie Rollene for evaluation and management of Lytic bone lesions. She is accompanied today by her son in law, and her partner is present via phone. The pt reports that she is doing well overall.    The pt notes that 2-3 months ago while standing at the stove, and otherwise feeling normally, she felt something snap that took her breath away in her mid back as she stretched to get something. The pt notes that she saw a chiropractor twice due to her back stiffness, which did not help. The pt then described her new bone pains to her PCP on 12/05/18, and subsequent imaging, as noted below, revealed concerns for numerous bone lesions. She has begun 70mg  Fosamax . The pt notes that she had hip pain in 2018, and that an XR at that time did not reveal any lesions.   The pt notes that most of her pain is concentrated to her left shoulder presently, and with minimal movement of the arm. She endorses pain radiating into her left arm and notes that her hand is swollen in the mornings when she wakes up. She also endorses present back pain and right hip pain, worse when she walks. She has not yet seen orthopedics. The pt reports that she is needing to take 600mg  Advil  every 4-6 hours as Tramadol  alone has not been able to alleviate her  pain. The pt denies any unexpected weight loss, fevers, chills, or night sweats. The pt notes that her urine is a very dark color presently, but denies overt blood in the urine, underpants, nor tissue paper. The pt notes that in the last two weeks she has had some soreness in her head, but denies new headaches or changes in vision. The pt notes that she has been urinating more frequently overall, but has been trying to stay better hydrated as well.   The pt notes that she has been compliant with annual mammograms.    The pt notes that she had a cyst in her right breast which was removed in the past. She fractured her left wrist in 2008 after falling down stairs. The pt endorses history of fatty liver.    The pt denies ever smoking cigarettes and endorses significant second hand smoke exposure with a previous marriage.    Of note prior to the patient's visit today, pt has had a Bone Scan completed on 12/13/18 with results revealing Multifocal uptake throughout the skeleton, consistent with diffuse metastatic disease. Primary tumor is not specified. 2. Uptake in the proximal right femur, consistent with lytic lesions. 3. Uptake in the ribs bilaterally as described. 4. Lesions in the proximal left humerus. 5. Diffuse uptake throughout the skull consistent with metastatic disease. 6. Right paramedian uptake at the manubrium.   Most recent lab results (12/08/18) of CBC w/diff and CMP is as follows: all values are WNL except for Glucose at 279, BUN at 24, AST at 41, ALT  at 46. 12/08/18 SPEP revealed all values WNL except for Total Protein at 6.0, Albumin at 3.6, Gamma globulin at 0.7, and M spike at 0.5g   On review of systems, pt reports significant left shoulder pain, back pain, right hip pain, dark urine, and denies fevers, chills, night sweats, unexpected weight loss, changes in bowel habits, changes in breathing, cough, new respiratory symptoms, changes in vision, abdominal pains, leg swelling, and any  other symptoms.    On PMHx the pt reports fatty liver, and denies blood clots.  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 Denies ever smoking.  On Family Hx the pt reports maternal grandmother with colon cancer. Father with bladder cancer and amyloidosis (pt notes that this could have been misdiagnosis). Mother with Protein S deficiency and polymyalgia rheumatica.   SUMMARY OF ONCOLOGIC HISTORY: Oncology History  Multiple myeloma (HCC)  01/16/2019 Initial Diagnosis   Multiple myeloma (HCC)   01/18/2019 - 01/25/2019 Chemotherapy   The patient had bortezomib  SQ (VELCADE ) chemo injection 2.5 mg, 1.3 mg/m2 = 2.5 mg, Subcutaneous,  Once, 1 of 4 cycles Administration: 2.5 mg (01/18/2019), 2.5 mg (01/25/2019)  for chemotherapy treatment.    02/02/2019 - 02/12/2023 Chemotherapy   Patient is on Treatment Plan : MYELOMA SALVAGE Carfilzomib  / Dexamethasone  q28d     02/26/2023 -  Chemotherapy   Patient is on Treatment Plan : MYELOMA MAINTENANCE Carfilzomib  (70) + Lenalidomide  q28d      Current Treatment: Patient is on Treatment Plan :  MYELOMA MAINTENANCE Carfilzomib  (70) + Lenalidomide  q28d   INTERVAL HISTORY: Faith Orr is a 83 y.o. female who is here today for continued evaluation and management of Multiple Myeloma.   she was last seen by me on 09/07/2024; at the time she mentioned experiencing feeling fullness in her legs/feet with associated coldness in extremities, but no color change - mild symptoms in hands bilaterally - though there no swelling or tingling.  Today, she says that she's feeling yucky. Reports experiencing pain in hip attributed to zometa , which she hasn't experienced before.   She endorses being extremely fatigued, but hasn't been more active than usual. Fatigue reportedly fluctuates after treatment, worsening and lingers. Notes that she generally isn't physically active, does household work mostly.   Denies any new infection  issues, nausea/vomiting, diarrhea, fever, cough, poor sleep, poor nutrition/hydration, or new medications  She states that the jerking in her legs has stopped, which is a relief for her as it was affecting her sleep.   Says that she is UTD on Flu vaccine, not Covid-19.   She states that she will be going to the [employer] for Christmas.   Notes noticing some cognitive changes, not much memory, but has noticed after a change she has difficulty resequencing. Recently obtained an upright piano, which she is happy about as she enjoys playing. She notes that she feels like this is mentally stimulating as she is having to recall musical technicalities.  REVIEW OF SYSTEMS:   10 Point review of systems of done and is negative except as noted above.  MEDICAL HISTORY Past Medical History:  Diagnosis Date   Allergy    seasonal   Asthma    DEPRESSION    DIABETES MELLITUS, TYPE II    Diverticulosis    HYPERLIPIDEMIA    Macular degeneration of left eye    mild, Dr.Hecker   Obesity, unspecified    Osteoarthritis of both knees    OSTEOPENIA  Osteopenia    URINARY INCONTINENCE     SURGICAL HISTORY Past Surgical History:  Procedure Laterality Date   CATARACT EXTRACTION Left 05/24/2018   CESAREAN SECTION  01/1973   CYSTOSCOPY/URETEROSCOPY/HOLMIUM LASER/STENT PLACEMENT Right 09/20/2019   Procedure: CYSTOSCOPY/URETEROSCOPY/HOLMIUM LASER/STENT PLACEMENT;  Surgeon: Carolee Sherwood JONETTA DOUGLAS, MD;  Location: [facility];  Service: Urology;  Laterality: Right;   FRACTURE SURGERY     IR BONE MARROW BIOPSY & ASPIRATION  02/16/2023   IR IMAGING GUIDED PORT INSERTION  02/20/2019   left wrist surgery  2008   By Dr. Lilly   right ankle  1994    SOCIAL HISTORY Social History   Tobacco Use   Smoking status: Never   Smokeless tobacco: Never   Tobacco comments:    Lives with partner Charlann Irving) and son  Vaping Use   Vaping status: Never Used  Substance Use Topics   Alcohol use: No     Alcohol/week: 0.0 standard drinks of alcohol   Drug use: No    Social History   Social History Narrative   Married    SOCIAL DRIVERS OF HEALTH SDOH Screenings   Food Insecurity: No Food Insecurity (09/12/2024)  Housing: Unknown (09/12/2024)  Transportation Needs: No Transportation Needs (09/12/2024)  Utilities: Not At Risk (09/12/2024)  Alcohol Screen: Low Risk (09/12/2024)  Depression (PHQ2-9): Medium Risk (10/20/2024)  Financial Resource Strain: Low Risk (09/12/2024)  Physical Activity: Inactive (09/12/2024)  Social Connections: Socially Integrated (09/12/2024)  Stress: No Stress Concern Present (09/12/2024)  Tobacco Use: Low Risk (09/12/2024)  Health Literacy: Adequate Health Literacy (09/12/2024)     FAMILY HISTORY Family History  Problem Relation Age of Onset   Diabetes Father    Hyperlipidemia Father    Heart disease Father    Cancer Father    Hypertension Father    Colon cancer Paternal Grandmother 43   Osteoporosis Mother    Protein S deficiency Mother    Hyperlipidemia Mother    Multiple sclerosis Daughter    Cancer Other        bladder   Breast cancer Neg Hx      ALLERGIES: is allergic to levofloxacin , penicillins, aleve [naproxen sodium], and sulfonamide derivatives.  MEDICATIONS  Current Outpatient Medications  Medication Sig Dispense Refill   acyclovir  (ZOVIRAX ) 400 MG tablet TAKE 1 TABLET(400 MG) BY MOUTH TWICE DAILY 60 tablet 5   Blood Glucose Monitoring Suppl (FREESTYLE FREEDOM LITE) W/DEVICE KIT Use to check blood sugars twice a day Dx 250.00 1 each 0   Calcium  Carbonate-Vitamin D  600-400 MG-UNIT tablet Take 1 tablet by mouth 2 (two) times daily.     dexamethasone  (DECADRON ) 4 MG tablet Take 5 tablets (20 mg total) by mouth once a week. On D22 of each cycle of treatment 20 tablet 5   ELIQUIS  2.5 MG TABS tablet TAKE 1 TABLET(2.5 MG) BY MOUTH TWICE DAILY 60 tablet 2   fentaNYL  (DURAGESIC ) 12 MCG/HR Place 1 patch onto the skin every 3 (three) days.  (Patient not taking: Reported on 09/12/2024) 15 patch 0   fluticasone  (FLONASE ) 50 MCG/ACT nasal spray Place 1 spray into both nostrils daily. 48 g 3   gabapentin  (NEURONTIN ) 100 MG capsule Take 1 capsule (100 mg total) by mouth 2 (two) times daily. (Patient not taking: Reported on 09/12/2024) 60 capsule 0   glucose blood (FREESTYLE LITE) test strip CHECK BLOOD SUGAR TWICE DAILY AS DIRECTED Dx 250.00 180 each 3   Lancets (FREESTYLE) lancets Use twice daily to check sugars. 100 each 11  lenalidomide  (REVLIMID ) 5 MG capsule Take 1 capsule (5 mg total) by mouth daily for 21 days. Take 7 days off. Repeat cycle 21 capsule 0   lidocaine  (LIDODERM ) 5 % Place 1 patch onto the skin daily. Remove & Discard patch within 12 hours or as directed by MD 30 patch 0   lidocaine -prilocaine  (EMLA ) cream APPLY 1 APPLICATION TO THE AFFECTED AREA AS NEEDED. USE PRIOR TO PORT ACCESS (Patient not taking: Reported on 09/12/2024) 30 g 0   meclizine  (ANTIVERT ) 25 MG tablet Take 1 tablet (25 mg total) by mouth every 8 (eight) hours as needed for dizziness. 20 tablet 0   metFORMIN  (GLUCOPHAGE -XR) 500 MG 24 hr tablet TAKE 1 TABLET(500 MG) BY MOUTH TWICE DAILY WITH A MEAL 180 tablet 3   metroNIDAZOLE  (METROGEL ) 1 % gel Apply topically in the morning and at bedtime. 60 g 1   Multiple Vitamins-Minerals (ICAPS) CAPS Take 1 capsule by mouth daily after breakfast.     ondansetron  (ZOFRAN ) 8 MG tablet TAKE 1 TABLET(8 MG) BY MOUTH TWICE DAILY AS NEEDED FOR NAUSEA OR VOMITING 30 tablet 1   Oxycodone  HCl 10 MG TABS Take 1 tablet (10 mg total) by mouth every 6 (six) hours as needed. 90 tablet 0   pantoprazole  (PROTONIX ) 20 MG tablet TAKE 1 TABLET(20 MG) BY MOUTH DAILY 30 tablet 2   polyethylene glycol (MIRALAX / GLYCOLAX) packet Take 17 g by mouth daily after breakfast.     potassium chloride  SA (KLOR-CON  M) 20 MEQ tablet TAKE 2 TABLETS(40 MEQ) BY MOUTH TWICE DAILY 360 tablet 1   prochlorperazine  (COMPAZINE ) 10 MG tablet Take 1 tablet (10  mg total) by mouth every 6 (six) hours as needed (Nausea or vomiting). (Patient not taking: Reported on 09/12/2024) 30 tablet 1   senna-docusate (SENNA S) 8.6-50 MG tablet Take 2 tablets by mouth at bedtime. 60 tablet 2   sertraline  (ZOLOFT ) 100 MG tablet TAKE 1 TABLET(100 MG) BY MOUTH DAILY 90 tablet 3   simvastatin  (ZOCOR ) 20 MG tablet TAKE 1 TABLET(20 MG) BY MOUTH DAILY AT 6 PM 90 tablet 3   tizanidine  (ZANAFLEX ) 2 MG capsule Take 1 capsule (2 mg total) by mouth in the morning and at bedtime. (Patient not taking: Reported on 09/12/2024) 20 capsule 0   Vitamin D , Ergocalciferol , (DRISDOL ) 1.25 MG (50000 UNIT) CAPS capsule TAKE 1 CAPSULE BY MOUTH EVERY 7 DAYS 12 capsule 0   No current facility-administered medications for this visit.   Facility-Administered Medications Ordered in Other Visits  Medication Dose Route Frequency Provider Last Rate Last Admin   heparin  lock flush 100 unit/mL  500 Units Intracatheter Once PRN Harmonii Karle Kishore, MD       sodium chloride  flush (NS) 0.9 % injection 10 mL  10 mL Intracatheter PRN Onesimo Emaline Brink, MD   10 mL at 12/17/23 1336    PHYSICAL EXAMINATION: ECOG PERFORMANCE STATUS: 1 - Symptomatic but completely ambulatory VITALS: VSS GENERAL: alert, in no acute distress and comfortable SKIN: no acute rashes, no significant lesions EYES: conjunctiva are pink and non-injected, sclera anicteric OROPHARYNX: MMM, no exudates, no oropharyngeal erythema or ulceration NECK: supple, no JVD LYMPH:  no palpable lymphadenopathy in the cervical, axillary or inguinal regions LUNGS: clear to auscultation b/l with normal respiratory effort HEART: regular rate & rhythm ABDOMEN:  normoactive bowel sounds , non tender, not distended, no hepatosplenomegaly Extremity: no pedal edema PSYCH: alert & oriented x 3 with fluent speech NEURO: no focal motor/sensory deficits  LABORATORY DATA:   I  have reviewed the data as listed     Latest Ref Rng & Units 11/17/2024     1:17 PM 11/03/2024   11:43 AM 10/20/2024   10:28 AM  CBC EXTENDED  WBC 4.0 - 10.5 K/uL 4.6  5.0  5.5   RBC 3.87 - 5.11 MIL/uL 4.01  4.14  4.15   Hemoglobin 12.0 - 15.0 g/dL 86.5  86.0  86.0   HCT 36.0 - 46.0 % 39.0  40.3  40.9   Platelets 150 - 400 K/uL 179  147  171   NEUT# 1.7 - 7.7 K/uL 2.6  2.7  2.8   Lymph# 0.7 - 4.0 K/uL 1.0  1.2  1.1        Latest Ref Rng & Units 11/17/2024    1:17 PM 11/03/2024   11:43 AM 10/20/2024   10:28 AM  CMP  Glucose 70 - 99 mg/dL 803  712  762   BUN 8 - 23 mg/dL 16  11  13    Creatinine 0.44 - 1.00 mg/dL 9.14  9.07  9.03   Sodium 135 - 145 mmol/L 140  139  139   Potassium 3.5 - 5.1 mmol/L 4.3  3.7  4.1   Chloride 98 - 111 mmol/L 108  105  108   CO2 22 - 32 mmol/L 22  21  24    Calcium  8.9 - 10.3 mg/dL 89.9  89.8  89.9   Total Protein 6.5 - 8.1 g/dL 6.2  6.3  6.3   Total Bilirubin 0.0 - 1.2 mg/dL 0.6  0.7  0.6   Alkaline Phos 38 - 126 U/L 89  92  95   AST 15 - 41 U/L 18  19  14    ALT 0 - 44 U/L 16  18  14       09/18/2019 BM Bx Report ([id number])    09/18/2019 FISH Panel     05/30/2019 BM Bx    01/06/2019 BM Bx:        01/06/19 Cytogenetics:         05/30/19 BM Biopsy:    09/18/2019 FISH Panel     09/18/2019 BM Surgical Pathology ([id number])         RADIOGRAPHIC STUDIES: I have personally reviewed the radiological images as listed and agreed with the findings in the report. NM PET Image Restage (PS) Whole Body Result Date: 10/05/2024 CLINICAL DATA:  Subsequent treatment strategy for multiple myeloma. EXAM: NUCLEAR MEDICINE PET WHOLE BODY TECHNIQUE: 7.66 mCi F-18 FDG was injected intravenously. Full-ring PET imaging was performed from the head to foot after the radiotracer. CT data was obtained and used for attenuation correction and anatomic localization. Fasting blood glucose: 135 mg/dl COMPARISON:  PET-CT 97/89/7974 and 10/14/2023. CT of the abdomen and pelvis 12/31/2023. FINDINGS: Mediastinal blood pool  activity: SUV max 2.6 HEAD/ NECK: No significant change in hypermetabolic left neck nodules. In the left parapharyngeal space, there is a 9 mm nodule on image 41/4 which has an SUV max of 9.6 (previously 10.7). There is a left level 2 node measuring 10 mm on image 51/4 with an SUV max of 19.0 (previously 17.9). No new nodules are identified. No suspicious activity identified within the pharyngeal mucosal space. Incidental CT findings: none CHEST: There are no hypermetabolic mediastinal, hilar or axillary lymph nodes. No hypermetabolic pulmonary activity or suspicious nodularity. Incidental CT findings: Right IJ Port-A-Cath extends to the superior cavoatrial junction. There is atherosclerosis of the aorta, great vessels and coronary arteries with calcifications of the aortic  valve. There are calcified left hilar lymph nodes the left lung granuloma. Underlying mild centrilobular emphysema with stable clustered noncalcified nodules in the lingula (image 25/7). ABDOMEN/PELVIS: There is no hypermetabolic activity within the liver, adrenal glands, spleen or pancreas. There is no hypermetabolic nodal activity in the abdomen or pelvis. Incidental CT findings: Calcified splenic granulomas. Mild aortoiliac atherosclerosis. SKELETON: Scattered small foci of hypermetabolic activity are demonstrated within the thoracolumbar spine, similar to the previous study. The greatest uptake is present anteriorly in the L3 vertebral body (SUV max 5.4, previously 4.2). This corresponds with osteophytes, and no enlarging lucent lesions or new hypermetabolic osseous lesions are seen. Widespread lucencies are again noted within the spine and bony pelvis, likely due to treated myeloma. Incidental CT findings: Old right-sided rib fractures. EXTREMITIES: No hypermetabolic activity within the extremities to suggest active multiple myeloma. Incidental CT findings: Iliofemoral atherosclerosis. IMPRESSION: 1. No significant change in hypermetabolic  left neck nodules, likely incidental parotid lesions. Lymph nodes considered less likely. 2. No definite evidence of metabolically active multiple myeloma. Grossly stable treated osseous lesions with scattered lucencies in the axial skeleton. 3. Stable scattered small foci of hypermetabolic activity within the thoracolumbar spine, likely degenerative. No new hypermetabolic osseous lesions identified. 4. Aortic Atherosclerosis (ICD10-I70.0) and Emphysema (ICD10-J43.9). Electronically Signed   By: Elsie Perone M.D.   On: 10/05/2024 16:23    ASSESSMENT & PLAN:  83 y.o. female with  1.  Nonsecretory multiple Myeloma, RISS Stage III   Labs upon initial presentation from 12/08/18, blood counts are normal including WBC at 7.1k, HGB at 13.1, and PLT at 245k. Calcium  normal at 10.3. Creatinine normal at 0.63. M spike at 0.5g. 12/13/18 Bone Scan revealed Multifocal uptake throughout the skeleton, consistent with diffuse metastatic disease. Primary tumor is not specified. 2. Uptake in the proximal right femur, consistent with lytic lesions. 3. Uptake in the ribs bilaterally as described. 4. Lesions in the proximal left humerus. 5. Diffuse uptake throughout the skull consistent with metastatic disease. 6. Right paramedian uptake at the manubrium.   12/13/18 CT Right Femur revealed Numerous lytic lesions involving the right femur and a lytic lesion in the left inferior pubic ramus. Overall appearance is most concerning for multiple myeloma   12/27/18 Pretreatment 24hour UPEP observed an M spike at 18mg , and showed 199mg  total protein/day.  12/27/18 Pretreatment MMP revealed M Protein at 0.5g with IgG Lambda specificity. Kappa:Lambda light chain ratio at 0.13, with Lambda at 40.3. There is less abnormal protein and light chains than I would expect from 30% plasma cells, which suggests hypo-secretory or non-secretory neoplastic plasma cells. Will have an impact in assessing response. 01/05/19 PET/CT revealed  Innumerable lytic lesions in the skeleton compatible with myeloma. Most of the larger lesions are hypermetabolic, for example including a left proximal humeral shaft lesion with maximum SUV of 8.1 and a 2.8 cm lesion in the left T9 vertebral body with maximum SUV 5.1. Most of the smaller lytic lesions, and some of the larger lesions, do not demonstrate accentuated metabolic activity. 2. 1.2 cm in short axis lymph node in the left parapharyngeal space is hypermetabolic with maximum SUV 11.8. I do not see a separate mass in the head and neck to give rise to this hypermetabolic lymph node. 3. Mosaic attenuation in the lower lobes, nonspecific possibly from air trapping. 4.  Aortic Atherosclerosis 5. Heterogeneous activity in the liver, making it hard to exclude small liver lesions. Consider hepatic protocol MRI with and without contrast for definitive assessment. Nonobstructive  right nephrolithiasis. Old granulomatous disease   01/06/19 Bone Marrow biopsy revealed interstitial increase in plasma cells (28% aspirate, 40% CD138 immunohistochemistry). Plasma cells negative for light chains consistent with a non or weakly secretory myeloma    01/06/19 Cytogenetics revealed 37% of cells with trisomy 11 or 11q deletion, and 40.5% of cells with 17p mutation   S/p 5 cycles of KRD treatment   05/31/19 BM Biopsy revealed mild atypical plasmacytosis at 5% with polytypic variation.    06/01/19 PET/CT revealed Dominant lesion in the LEFT humerus is decreased significantly in metabolic activity. Additional hypermetabolic skeletal lytic lesions have decreased in metabolic activity or similar to comparison exam (01/05/2019). No evidence of disease progression. 2. Multiple additional lytic lesions do not have metabolic activity and unchanged. 3. No new skeletal lesions are identified. No soft tissue plasmacytoma identified. 4. Nodule / node in the LEFT parapharyngeal space which is intensely hypermetabolic not changed from  prior. 5. New hypermetabolic LEFT lower lobe pulmonary nodule is indeterminate. Recommend close attention on follow-up 6. New obstructive hydronephrosis of the RIGHT kidney related to RIGHT UPJ stone.   09/18/2019 BM Bx Report which revealed Slightly hypercellular bone marrow for age with trilineage hematopoiesis and 1% plasma cells.   09/14/2019 PET/CT Whole Body Scan ([id number]) which revealed 1. There widespread tiny lytic lesions compatible with multiple myeloma. Index larger lesions are generally similar to the prior exam, with low-grade activity such as the left T9 vertebral body lesion with maximum SUV 4.5. Is mild increase in the activity associated with a mildly sclerotic left proximal humeral lesion, maximum SUV 4.8 (previously 3.5). 2. At the site of the prior left lower lobe nodule is currently more bandlike thickening, with maximum SUV only 1.9, probably benign, continued surveillance of this region suggested. 3. There several small but hypermetabolic lymph nodes. This includes a left parapharyngeal space node measuring 1.0 cm with maximum SUV 12.3 (stable); a left level IB lymph node measuring 0.5 cm with maximum SUV 4.8 (slightly larger than prior); and a left inguinal lymph node measuring 0.7 cm in short axis with maximum SUV 6.4 (previously 0.5 cm with maximum SUV 0.6). Significance of these lymph nodes uncertain, surveillance is recommended. 4. New 5 mm left lower lobe subpleural nodule on image 32/8, not appreciably hypermetabolic, surveillance suggested. 5. Focal subcutaneous stranding along the left perineum measuring about 2.6 by 1.1 cm on image 221/4, maximum SUV 12.5. This was not present previously and is most likely inflammatory, although given the notable SUV, surveillance of this region is suggested. 6. Other imaging findings of potential clinical significance: Aortic Atherosclerosis (ICD10-I70.0). Coronary atherosclerosis. Old granulomatous disease. Mild right hydronephrosis due  to a 7 mm right UPJ calculus. 2 mm right kidney upper pole nonobstructive renal calculus. Prominent stool throughout the colon favors constipation.   12/19/2019 Thoracic & Lumbar Spine MRI ([id number]) ([id number]) revealed Suspected myeloma lesions at T9 and S1. No compression deformity or epidural disease.   01/18/2020 PET/CT ([id number]) which revealed 1. Stable lytic lesions throughout the skeleton. The larger lytic lesions which had mild metabolic activity on comparison exam now have background metabolic activity. No evidence of active myeloma. No evidence of progression multiple myeloma.  No plasmacytoma 3. Hypermetabolic nodules in the LEFT neck may be associated deep tissues of the LEFT parotid gland. 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   02/16/2023 Bone Marrow biopsy          2. Heterogeneous liver activity, as seen on 01/05/19  PET/CT Extra-medullary hematopoiesis vs metabolic liver disease vs hepatic malignancy ?  01/17/19 MRI Liver revealed Several appreciable liver lesions all have benign imaging characteristics. No MRI findings of metastatic involvement of the liver. 2. Scattered bony lesions corresponding to the lytic lesions seen at PET-CT, compatible with active myeloma. 3. Aortic Atherosclerosis.  Mild cardiomegaly. 4. Diffuse hepatic steatosis.    3. Left lower lobe pulmonary nodule First seen on 06/01/19 PET/CT PET/CT 08/27/2020: No hypermetabolic mediastinal or hilar nodes. No suspicious pulmonary nodules on the CT scan.   4. 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 Being managed conservatively as per patient's preference.  No symptoms from this currently.   5. Mild chronic kidney disease CMP done 04/09/2022 showed creatinine of 1.17 and GFR est of 48 consistent with mild chronic kidney disease.  PLAN: - Discussed lab  results on 11/17/2024 in detail with patient: CBC stable WBC of 4.6K, Hemoglobin of 13.4, and PLTs of 179K. CMP with Glucose 196 decreased from 287.   - Discussed repeating BMBX in a couple of months to look at MRD status to determine whether medication could be reduced or discontinued.   - again reviewed 10/03/2024 PET scan: 1. No significant change in hypermetabolic left neck nodules, likely incidental parotid lesions. Lymph nodes considered less likely.  2. No definite evidence of metabolically active multiple myeloma. Grossly stable treated osseous lesions with scattered lucencies in the axial skeleton.  3. Stable scattered small foci of hypermetabolic activity within the thoracolumbar spine, likely degenerative. No new hypermetabolic osseous lesions identified.  4. Aortic Atherosclerosis (ICD10-I70.0) and Emphysema (ICD10-J43.9).   -patient notes no symptoms suggestive of myeloma progression at this time.  -patient notes no significant toxicities from her maintenance Carfilzomib  and Revlimid  at this time.  FOLLOW-UP  Plz schedule next 4 cycles of maintenance Carfilzomib  per integrated scheduling MD visit every 8 weeks  The total time spent in the appointment was 30 minutes* .  All of the patient's questions were answered and the patient knows to call the clinic with any problems, questions, or concerns.  Emaline Saran MD MS AAHIVMS [facility] Hematology/Oncology Physician [facility]  *Total Encounter Time as defined by the Centers for Medicare and Medicaid Services includes, in addition to the face-to-face time of a patient visit (documented in the note above) non-face-to-face time: obtaining and reviewing outside history, ordering and reviewing medications, tests or procedures, care coordination (communications with other health care professionals or caregivers) and documentation in the medical record.  I,Emily Lagle,acting as a neurosurgeon for Emaline Saran, MD.,have documented  all relevant documentation on the behalf of Emaline Saran, MD,as directed by  Emaline Saran, MD while in the presence of Emaline Saran, MD.  I have reviewed the above documentation for accuracy and completeness, and I agree with the above.  Shaft Corigliano, MD

## 2024-11-27 ENCOUNTER — Other Ambulatory Visit: Payer: Self-pay

## 2024-11-27 DIAGNOSIS — C9000 Multiple myeloma not having achieved remission: Secondary | ICD-10-CM

## 2024-11-27 MED ORDER — LENALIDOMIDE 5 MG PO CAPS: ORAL_CAPSULE | ORAL | 0 refills | Status: DC

## 2024-12-01 ENCOUNTER — Inpatient Hospital Stay

## 2024-12-01 VITALS — BP 118/68 | HR 70 | Temp 98.1°F | Resp 18 | Wt 153.8 lb

## 2024-12-01 DIAGNOSIS — Z7189 Other specified counseling: Secondary | ICD-10-CM

## 2024-12-01 DIAGNOSIS — Z5112 Encounter for antineoplastic immunotherapy: Secondary | ICD-10-CM | POA: Diagnosis not present

## 2024-12-01 DIAGNOSIS — C9000 Multiple myeloma not having achieved remission: Secondary | ICD-10-CM

## 2024-12-01 LAB — CBC WITH DIFFERENTIAL (CANCER CENTER ONLY)
Abs Immature Granulocytes: 0.02 K/uL (ref 0.00–0.07)
Basophils Absolute: 0.1 K/uL (ref 0.0–0.1)
Basophils Relative: 1 %
Eosinophils Absolute: 0.2 K/uL (ref 0.0–0.5)
Eosinophils Relative: 3 %
HCT: 38.9 % (ref 36.0–46.0)
Hemoglobin: 13.5 g/dL (ref 12.0–15.0)
Immature Granulocytes: 0 %
Lymphocytes Relative: 22 %
Lymphs Abs: 1.4 K/uL (ref 0.7–4.0)
MCH: 33.3 pg (ref 26.0–34.0)
MCHC: 34.7 g/dL (ref 30.0–36.0)
MCV: 95.8 fL (ref 80.0–100.0)
Monocytes Absolute: 1.5 K/uL — ABNORMAL HIGH (ref 0.1–1.0)
Monocytes Relative: 23 %
Neutro Abs: 3.2 K/uL (ref 1.7–7.7)
Neutrophils Relative %: 51 %
Platelet Count: 164 K/uL (ref 150–400)
RBC: 4.06 MIL/uL (ref 3.87–5.11)
RDW: 13.4 % (ref 11.5–15.5)
WBC Count: 6.4 K/uL (ref 4.0–10.5)
nRBC: 0 % (ref 0.0–0.2)

## 2024-12-01 LAB — CMP (CANCER CENTER ONLY)
ALT: 17 U/L (ref 0–44)
AST: 17 U/L (ref 15–41)
Albumin: 4.2 g/dL (ref 3.5–5.0)
Alkaline Phosphatase: 94 U/L (ref 38–126)
Anion gap: 12 (ref 5–15)
BUN: 13 mg/dL (ref 8–23)
CO2: 21 mmol/L — ABNORMAL LOW (ref 22–32)
Calcium: 10 mg/dL (ref 8.9–10.3)
Chloride: 105 mmol/L (ref 98–111)
Creatinine: 0.88 mg/dL (ref 0.44–1.00)
GFR, Estimated: >60 mL/min
Glucose, Bld: 197 mg/dL — ABNORMAL HIGH (ref 70–99)
Potassium: 4.1 mmol/L (ref 3.5–5.1)
Sodium: 138 mmol/L (ref 135–145)
Total Bilirubin: 0.6 mg/dL (ref 0.0–1.2)
Total Protein: 6.3 g/dL — ABNORMAL LOW (ref 6.5–8.1)

## 2024-12-01 MED ORDER — SODIUM CHLORIDE 0.9 % IV SOLN: Freq: Once | INTRAVENOUS | Status: AC

## 2024-12-01 MED ORDER — DEXTROSE 5 % IV SOLN
36.0000 mg/m2 | Freq: Once | INTRAVENOUS | Status: AC
  Administered 2024-12-01: 60 mg via INTRAVENOUS
  Filled 2024-12-01: qty 30

## 2024-12-01 MED ORDER — FAMOTIDINE IN NACL 20-0.9 MG/50ML-% IV SOLN
20.0000 mg | Freq: Once | INTRAVENOUS | Status: AC
  Administered 2024-12-01: 20 mg via INTRAVENOUS
  Filled 2024-12-01: qty 50

## 2024-12-01 MED ORDER — ACETAMINOPHEN 325 MG PO TABS
650.0000 mg | ORAL_TABLET | Freq: Once | ORAL | Status: AC
  Administered 2024-12-01: 650 mg via ORAL
  Filled 2024-12-01: qty 2

## 2024-12-01 MED ORDER — DEXAMETHASONE 4 MG PO TABS
8.0000 mg | ORAL_TABLET | Freq: Once | ORAL | Status: AC
  Administered 2024-12-01: 8 mg via ORAL
  Filled 2024-12-01: qty 2

## 2024-12-01 MED ORDER — DIPHENHYDRAMINE HCL 25 MG PO CAPS
25.0000 mg | ORAL_CAPSULE | Freq: Once | ORAL | Status: AC
  Administered 2024-12-01: 25 mg via ORAL
  Filled 2024-12-01: qty 1

## 2024-12-01 MED ORDER — PROCHLORPERAZINE MALEATE 10 MG PO TABS
10.0000 mg | ORAL_TABLET | Freq: Once | ORAL | Status: AC
  Administered 2024-12-01: 10 mg via ORAL
  Filled 2024-12-01: qty 1

## 2024-12-01 MED ORDER — SODIUM CHLORIDE 0.9% FLUSH
10.0000 mL | INTRAVENOUS | Status: DC | PRN
  Administered 2024-12-01: 10 mL

## 2024-12-13 ENCOUNTER — Encounter: Payer: Self-pay | Admitting: Hematology

## 2024-12-14 ENCOUNTER — Other Ambulatory Visit: Payer: Self-pay | Admitting: Physician Assistant

## 2024-12-14 DIAGNOSIS — C9000 Multiple myeloma not having achieved remission: Secondary | ICD-10-CM

## 2024-12-14 MED ORDER — OXYCODONE HCL 10 MG PO TABS: 10.0000 mg | ORAL_TABLET | Freq: Four times a day (QID) | ORAL | 0 refills | Status: AC | PRN

## 2024-12-14 NOTE — Assessment & Plan Note (Addendum)
"   Multiple Myeloma.  Today, she reports experiencing pain in hip attributed to zometa , which she hasn't experienced before. Using her walker at all times to increased weakness and feeling unsteady on her feet.   She endorses being extremely fatigued, but hasn't been more active than usual. Fatigue reportedly fluctuates after treatment. Notes that she generally isn't physically active, does household work mostly.   Denies any new infection issues, nausea/vomiting, diarrhea, fever, cough, poor sleep, poor nutrition/hydration, or new medications Says that she is UTD on Flu vaccine, not Covid-19.    She states that she will [place] going to [place] in the spring.    Notes noticing some cognitive changes, not much memory, but has noticed after a change she has difficulty resequencing. Recently obtained an upright piano, which she is happy about as she enjoys playing. She notes that she feels like this is mentally stimulating as she is having to recall musical "

## 2024-12-14 NOTE — Progress Notes (Signed)
 " Patient Care Team: Rollene Almarie LABOR, MD as PCP - General (Internal Medicine) Obie Princella HERO, MD (Inactive) as Consulting Physician (Gastroenterology) Cleotilde Ronal RAMAN, MD as Consulting Physician (Gynecology) Sheril Coy, MD as Consulting Physician (Orthopedic Surgery) Neysa Reggy BIRCH, MD as Consulting Physician (Pulmonary Disease) Cleatus Collar, MD (Ophthalmology)  Clinic Day:  12/15/2024  Referring physician: Onesimo Emaline Brink, MD  ASSESSMENT & PLAN:   Assessment & Plan: Multiple myeloma [facility])  Multiple Myeloma.  Today, she reports experiencing pain in hip attributed to zometa , which she hasn't experienced before. Using her walker at all times to increased weakness and feeling unsteady on her feet.   She endorses being extremely fatigued, but hasn't been more active than usual. Fatigue reportedly fluctuates after treatment. Notes that she generally isn't physically active, does household work mostly.   Denies any new infection issues, nausea/vomiting, diarrhea, fever, cough, poor sleep, poor nutrition/hydration, or new medications Says that she is UTD on Flu vaccine, not Covid-19.    She states that she will be going to [place] in the spring.    Notes noticing some cognitive changes, not much memory, but has noticed after a change she has difficulty resequencing. Recently obtained an upright piano, which she is happy about as she enjoys playing. She notes that she feels like this is mentally stimulating as she is having to recall musical    Plan Labs reviewed. -Mild leukopenia with WBC 3.7.  ANC 2.0. -CMP unremarkable.  Calcium  is 10.4. Labs and patient presentation are appropriate for treatment. - Proceed with cycle 24 of maintenance Kyprolis .   - Continue Revlimid  daily for 21 days followed by 7 days off. Labs/flush, follow-up, and next treatment as scheduled.  The patient understands the plans discussed today and is in agreement with them.  She knows to contact our  office if she develops concerns prior to her next appointment.  I provided 25 minutes of face-to-face time during this encounter and > 50% was spent counseling as documented under my assessment and plan.    Powell FORBES Lessen, NP  [facility] - A DEPT OF [facility] [address] [place] [address] Dept: [phone] Dept Fax: [phone]   No orders of the defined types were placed in this encounter.     CHIEF COMPLAINT:  CC: Nonsecretory multiple myeloma  Current Treatment: Carfilzomib  and lenalidomide   INTERVAL HISTORY:  Simon is here today for repeat clinical assessment.  She last saw Dr. Onesimo on 11/17/2024.  Today is cycle 24 day 15 maintenance carfilzomib  and lenalidomide .  Reports increased low back and hip pain.  Symptoms will radiate down her legs.  States this is gradually getting worse.  She does have prescription for oxycodone  twice daily.  Will also take Tylenol , especially in light to help her sleep.  She does have some peripheral neuropathy in her feet.  She is currently taking B12 supplement which is helping significantly.  She does feel unbalanced.  Needs to use her walker at all times.  She denies feeling dizzy.  States this is more of a sensation of weakness in her legs.  She has also noticed it takes longer for her to recover from treatment.  Treatments every 2 weeks.  Takes her a full week to recover.  She denies chest pain, chest pressure, or shortness of breath. She denies headaches or visual disturbances. She denies abdominal pain, nausea, vomiting, or changes in bowel or bladder habits.  She denies fevers or chills. Her appetite is fair. Her weight has been stable.  I have reviewed the past medical history, past surgical history, social history and family history with the patient and they are unchanged from previous note.  ALLERGIES:  is allergic to levofloxacin , penicillins, aleve [naproxen  sodium], and sulfonamide derivatives.  MEDICATIONS:  Current Outpatient Medications  Medication Sig Dispense Refill   acyclovir  (ZOVIRAX ) 400 MG tablet TAKE 1 TABLET(400 MG) BY MOUTH TWICE DAILY 60 tablet 5   Blood Glucose Monitoring Suppl (FREESTYLE FREEDOM LITE) W/DEVICE KIT Use to check blood sugars twice a day Dx 250.00 1 each 0   Calcium  Carbonate-Vitamin D  600-400 MG-UNIT tablet Take 1 tablet by mouth 2 (two) times daily.     dexamethasone  (DECADRON ) 4 MG tablet Take 5 tablets (20 mg total) by mouth once a week. On D22 of each cycle of treatment 20 tablet 5   ELIQUIS  2.5 MG TABS tablet TAKE 1 TABLET(2.5 MG) BY MOUTH TWICE DAILY 60 tablet 2   fentaNYL  (DURAGESIC ) 12 MCG/HR Place 1 patch onto the skin every 3 (three) days. 15 patch 0   fluticasone  (FLONASE ) 50 MCG/ACT nasal spray Place 1 spray into both nostrils daily. 48 g 3   gabapentin  (NEURONTIN ) 100 MG capsule Take 1 capsule (100 mg total) by mouth 2 (two) times daily. 60 capsule 0   glucose blood (FREESTYLE LITE) test strip CHECK BLOOD SUGAR TWICE DAILY AS DIRECTED Dx 250.00 180 each 3   Lancets (FREESTYLE) lancets Use twice daily to check sugars. 100 each 11   lenalidomide  (REVLIMID ) 5 MG capsule Take 1 capsule (5 mg total) by mouth daily for 21 days. Take 7 days off. Repeat cycle 21 capsule 0   lidocaine  (LIDODERM ) 5 % Place 1 patch onto the skin daily. Remove & Discard patch within 12 hours or as directed by MD 30 patch 0   lidocaine -prilocaine  (EMLA ) cream APPLY 1 APPLICATION TO THE AFFECTED AREA AS NEEDED. USE PRIOR TO PORT ACCESS 30 g 0   meclizine  (ANTIVERT ) 25 MG tablet Take 1 tablet (25 mg total) by mouth every 8 (eight) hours as needed for dizziness. 20 tablet 0   metFORMIN  (GLUCOPHAGE -XR) 500 MG 24 hr tablet TAKE 1 TABLET(500 MG) BY MOUTH TWICE DAILY WITH A MEAL 180 tablet 3   metroNIDAZOLE  (METROGEL ) 1 % gel Apply topically in the morning and at bedtime. 60 g 1   Multiple Vitamins-Minerals (ICAPS) CAPS Take 1 capsule by  mouth daily after breakfast.     ondansetron  (ZOFRAN ) 8 MG tablet TAKE 1 TABLET(8 MG) BY MOUTH TWICE DAILY AS NEEDED FOR NAUSEA OR VOMITING 30 tablet 1   Oxycodone  HCl 10 MG TABS Take 1 tablet (10 mg total) by mouth every 6 (six) hours as needed. 90 tablet 0   pantoprazole  (PROTONIX ) 20 MG tablet TAKE 1 TABLET(20 MG) BY MOUTH DAILY 30 tablet 2   polyethylene glycol (MIRALAX / GLYCOLAX) packet Take 17 g by mouth daily after breakfast.     potassium chloride  SA (KLOR-CON  M) 20 MEQ tablet TAKE 2 TABLETS(40 MEQ) BY MOUTH TWICE DAILY 360 tablet 1   prochlorperazine  (COMPAZINE ) 10 MG tablet Take 1 tablet (10 mg total) by mouth every 6 (six) hours as needed (Nausea or vomiting). 30 tablet 1   senna-docusate (SENNA S) 8.6-50 MG tablet Take 2 tablets by mouth at bedtime. 60 tablet 2   sertraline  (ZOLOFT ) 100 MG tablet TAKE 1 TABLET(100 MG) BY MOUTH DAILY 90 tablet 3   simvastatin  (ZOCOR ) 20 MG  tablet TAKE 1 TABLET(20 MG) BY MOUTH DAILY AT 6 PM 90 tablet 3   tizanidine  (ZANAFLEX ) 2 MG capsule Take 1 capsule (2 mg total) by mouth in the morning and at bedtime. 20 capsule 0   Vitamin D , Ergocalciferol , (DRISDOL ) 1.25 MG (50000 UNIT) CAPS capsule TAKE 1 CAPSULE BY MOUTH EVERY 7 DAYS 12 capsule 0   No current facility-administered medications for this visit.   Facility-Administered Medications Ordered in Other Visits  Medication Dose Route Frequency Provider Last Rate Last Admin   heparin  lock flush 100 unit/mL  500 Units Intracatheter Once PRN Kale, Gautam Kishore, MD       sodium chloride  flush (NS) 0.9 % injection 10 mL  10 mL Intracatheter PRN Kale, Gautam Kishore, MD   10 mL at 12/17/23 1336    HISTORY OF PRESENT ILLNESS:   Oncology History  Multiple myeloma (HCC)  01/16/2019 Initial Diagnosis   Multiple myeloma (HCC)   01/18/2019 - 01/25/2019 Chemotherapy   The patient had bortezomib  SQ (VELCADE ) chemo injection 2.5 mg, 1.3 mg/m2 = 2.5 mg, Subcutaneous,  Once, 1 of 4 cycles Administration: 2.5 mg  (01/18/2019), 2.5 mg (01/25/2019)  for chemotherapy treatment.    02/02/2019 - 02/12/2023 Chemotherapy   Patient is on Treatment Plan : MYELOMA SALVAGE Carfilzomib  / Dexamethasone  q28d     02/26/2023 -  Chemotherapy   Patient is on Treatment Plan : MYELOMA MAINTENANCE Carfilzomib  (70) + Lenalidomide  q28d         REVIEW OF SYSTEMS:   Constitutional: Denies fevers, chills or abnormal weight loss. Generalized weakness.  Eyes: Denies blurriness of vision Ears, nose, mouth, throat, and face: Denies mucositis or sore throat Respiratory: Denies cough, dyspnea or wheezes Cardiovascular: Denies palpitation, chest discomfort or lower extremity swelling Gastrointestinal:  Denies nausea, heartburn or change in bowel habits Skin: Denies abnormal skin rashes Lymphatics: Denies new lymphadenopathy or easy bruising Neurological:Denies numbness or  tingling. She is using a walker at all times to help with her balance.  Behavioral/Psych: Mood is stable, no new changes  All other systems were reviewed with the patient and are negative.   VITALS:   Today's Vitals   12/15/24 1400 12/15/24 1420  BP:  134/62  Pulse:  62  Resp:  17  Temp:  (!) 97.1 F (36.2 C)  TempSrc:  Temporal  SpO2:  95%  Weight:  153 lb (69.4 kg)  Height:  5' 3 (1.6 m)  PainSc: 4     Body mass index is 27.1 kg/m.   Wt Readings from Last 3 Encounters:  12/15/24 153 lb (69.4 kg)  12/01/24 153 lb 12 oz (69.7 kg)  11/17/24 153 lb 8 oz (69.6 kg)    Body mass index is 27.1 kg/m.  Performance status (ECOG): 2 - Symptomatic, <50% confined to bed  PHYSICAL EXAM:   GENERAL:alert, no distress and comfortable SKIN: skin color, texture, turgor are normal, no rashes or significant lesions EYES: normal, Conjunctiva are pink and non-injected, sclera clear OROPHARYNX:no exudate, no erythema and lips, buccal mucosa, and tongue normal  NECK: supple, thyroid  normal size, non-tender, without nodularity LYMPH:  no palpable  lymphadenopathy in the cervical, axillary or inguinal LUNGS: clear to auscultation and percussion with normal breathing effort HEART: regular rate & rhythm and no murmurs and no lower extremity edema ABDOMEN:abdomen soft, non-tender and normal bowel sounds Musculoskeletal:no cyanosis of digits and no clubbing  NEURO: alert & oriented x 3 with fluent speech, no focal motor/sensory deficits  LABORATORY DATA:  I have reviewed  the data as listed    Component Value Date/Time   NA 140 12/29/2024 1356   K 3.9 12/29/2024 1356   CL 105 12/29/2024 1356   CO2 23 12/29/2024 1356   GLUCOSE 210 (H) 12/29/2024 1356   BUN 13 12/29/2024 1356   CREATININE 0.87 12/29/2024 1356   CALCIUM  9.9 12/29/2024 1356   PROT 6.2 (L) 12/29/2024 1356   ALBUMIN 4.1 12/29/2024 1356   AST 15 12/29/2024 1356   ALT 16 12/29/2024 1356   ALKPHOS 93 12/29/2024 1356   BILITOT 0.6 12/29/2024 1356   GFRNONAA >60 12/29/2024 1356   GFRAA >60 09/11/2020 0915    Lab Results  Component Value Date   WBC 5.3 12/29/2024   NEUTROABS 2.7 12/29/2024   HGB 13.2 12/29/2024   HCT 38.6 12/29/2024   MCV 99.0 12/29/2024   PLT 151 12/29/2024     "

## 2024-12-15 ENCOUNTER — Inpatient Hospital Stay (HOSPITAL_BASED_OUTPATIENT_CLINIC_OR_DEPARTMENT_OTHER): Admitting: Nurse Practitioner

## 2024-12-15 ENCOUNTER — Inpatient Hospital Stay

## 2024-12-15 ENCOUNTER — Inpatient Hospital Stay: Admitting: Nurse Practitioner

## 2024-12-15 ENCOUNTER — Inpatient Hospital Stay: Attending: Hematology

## 2024-12-15 VITALS — BP 134/62 | HR 62 | Temp 97.1°F | Resp 17 | Ht 63.0 in | Wt 153.0 lb

## 2024-12-15 DIAGNOSIS — Z79899 Other long term (current) drug therapy: Secondary | ICD-10-CM | POA: Insufficient documentation

## 2024-12-15 DIAGNOSIS — Z7189 Other specified counseling: Secondary | ICD-10-CM

## 2024-12-15 DIAGNOSIS — C9000 Multiple myeloma not having achieved remission: Secondary | ICD-10-CM

## 2024-12-15 DIAGNOSIS — Z5112 Encounter for antineoplastic immunotherapy: Secondary | ICD-10-CM | POA: Diagnosis present

## 2024-12-15 LAB — CBC WITH DIFFERENTIAL (CANCER CENTER ONLY)
Abs Immature Granulocytes: 0.01 K/uL (ref 0.00–0.07)
Basophils Absolute: 0 K/uL (ref 0.0–0.1)
Basophils Relative: 1 %
Eosinophils Absolute: 0 K/uL (ref 0.0–0.5)
Eosinophils Relative: 1 %
HCT: 40.3 % (ref 36.0–46.0)
Hemoglobin: 13.8 g/dL (ref 12.0–15.0)
Immature Granulocytes: 0 %
Lymphocytes Relative: 25 %
Lymphs Abs: 0.9 K/uL (ref 0.7–4.0)
MCH: 33.7 pg (ref 26.0–34.0)
MCHC: 34.2 g/dL (ref 30.0–36.0)
MCV: 98.3 fL (ref 80.0–100.0)
Monocytes Absolute: 0.7 K/uL (ref 0.1–1.0)
Monocytes Relative: 18 %
Neutro Abs: 2 K/uL (ref 1.7–7.7)
Neutrophils Relative %: 55 %
Platelet Count: 176 K/uL (ref 150–400)
RBC: 4.1 MIL/uL (ref 3.87–5.11)
RDW: 13.5 % (ref 11.5–15.5)
WBC Count: 3.7 K/uL — ABNORMAL LOW (ref 4.0–10.5)
nRBC: 0 % (ref 0.0–0.2)

## 2024-12-15 LAB — CMP (CANCER CENTER ONLY)
ALT: 16 U/L (ref 0–44)
AST: 19 U/L (ref 15–41)
Albumin: 4.2 g/dL (ref 3.5–5.0)
Alkaline Phosphatase: 103 U/L (ref 38–126)
Anion gap: 14 (ref 5–15)
BUN: 15 mg/dL (ref 8–23)
CO2: 20 mmol/L — ABNORMAL LOW (ref 22–32)
Calcium: 10.4 mg/dL — ABNORMAL HIGH (ref 8.9–10.3)
Chloride: 107 mmol/L (ref 98–111)
Creatinine: 0.88 mg/dL (ref 0.44–1.00)
GFR, Estimated: >60 mL/min
Glucose, Bld: 240 mg/dL — ABNORMAL HIGH (ref 70–99)
Potassium: 4.3 mmol/L (ref 3.5–5.1)
Sodium: 141 mmol/L (ref 135–145)
Total Bilirubin: 0.6 mg/dL (ref 0.0–1.2)
Total Protein: 6.5 g/dL (ref 6.5–8.1)

## 2024-12-15 MED ORDER — ACETAMINOPHEN 325 MG PO TABS
650.0000 mg | ORAL_TABLET | Freq: Once | ORAL | Status: AC
  Administered 2024-12-15: 650 mg via ORAL
  Filled 2024-12-15: qty 2

## 2024-12-15 MED ORDER — PROCHLORPERAZINE MALEATE 10 MG PO TABS
10.0000 mg | ORAL_TABLET | Freq: Once | ORAL | Status: AC
  Administered 2024-12-15: 10 mg via ORAL
  Filled 2024-12-15: qty 1

## 2024-12-15 MED ORDER — SODIUM CHLORIDE 0.9 % IV SOLN: Freq: Once | INTRAVENOUS | Status: AC

## 2024-12-15 MED ORDER — FAMOTIDINE IN NACL 20-0.9 MG/50ML-% IV SOLN
20.0000 mg | Freq: Once | INTRAVENOUS | Status: AC
  Administered 2024-12-15: 20 mg via INTRAVENOUS
  Filled 2024-12-15: qty 50

## 2024-12-15 MED ORDER — SODIUM CHLORIDE 0.9% FLUSH: 10.0000 mL | INTRAVENOUS | Status: DC | PRN

## 2024-12-15 MED ORDER — DEXAMETHASONE 4 MG PO TABS
8.0000 mg | ORAL_TABLET | Freq: Once | ORAL | Status: AC
  Administered 2024-12-15: 8 mg via ORAL
  Filled 2024-12-15: qty 2

## 2024-12-15 MED ORDER — DIPHENHYDRAMINE HCL 25 MG PO CAPS
25.0000 mg | ORAL_CAPSULE | Freq: Once | ORAL | Status: AC
  Administered 2024-12-15: 25 mg via ORAL
  Filled 2024-12-15: qty 1

## 2024-12-15 MED ORDER — DEXTROSE 5 % IV SOLN
36.0000 mg/m2 | Freq: Once | INTRAVENOUS | Status: AC
  Administered 2024-12-15: 60 mg via INTRAVENOUS
  Filled 2024-12-15: qty 30

## 2024-12-15 NOTE — Patient Instructions (Signed)

## 2024-12-15 NOTE — Progress Notes (Signed)
 Per patient- still awaiting dental work- no Zometa 

## 2024-12-19 DIAGNOSIS — C9000 Multiple myeloma not having achieved remission: Secondary | ICD-10-CM

## 2024-12-19 DIAGNOSIS — C7951 Secondary malignant neoplasm of bone: Secondary | ICD-10-CM

## 2024-12-29 ENCOUNTER — Inpatient Hospital Stay

## 2024-12-29 VITALS — BP 116/53 | HR 64 | Resp 18

## 2024-12-29 DIAGNOSIS — Z7189 Other specified counseling: Secondary | ICD-10-CM

## 2024-12-29 DIAGNOSIS — C9000 Multiple myeloma not having achieved remission: Secondary | ICD-10-CM

## 2024-12-29 DIAGNOSIS — Z5112 Encounter for antineoplastic immunotherapy: Secondary | ICD-10-CM | POA: Diagnosis not present

## 2024-12-29 LAB — CBC WITH DIFFERENTIAL (CANCER CENTER ONLY)
Abs Immature Granulocytes: 0.02 K/uL (ref 0.00–0.07)
Basophils Absolute: 0 K/uL (ref 0.0–0.1)
Basophils Relative: 1 %
Eosinophils Absolute: 0.1 K/uL (ref 0.0–0.5)
Eosinophils Relative: 3 %
HCT: 38.6 % (ref 36.0–46.0)
Hemoglobin: 13.2 g/dL (ref 12.0–15.0)
Immature Granulocytes: 0 %
Lymphocytes Relative: 19 %
Lymphs Abs: 1 K/uL (ref 0.7–4.0)
MCH: 33.8 pg (ref 26.0–34.0)
MCHC: 34.2 g/dL (ref 30.0–36.0)
MCV: 99 fL (ref 80.0–100.0)
Monocytes Absolute: 1.4 K/uL — ABNORMAL HIGH (ref 0.1–1.0)
Monocytes Relative: 26 %
Neutro Abs: 2.7 K/uL (ref 1.7–7.7)
Neutrophils Relative %: 51 %
Platelet Count: 151 K/uL (ref 150–400)
RBC: 3.9 MIL/uL (ref 3.87–5.11)
RDW: 13.5 % (ref 11.5–15.5)
WBC Count: 5.3 K/uL (ref 4.0–10.5)
nRBC: 0 % (ref 0.0–0.2)

## 2024-12-29 LAB — CMP (CANCER CENTER ONLY)
ALT: 16 U/L (ref 0–44)
AST: 15 U/L (ref 15–41)
Albumin: 4.1 g/dL (ref 3.5–5.0)
Alkaline Phosphatase: 93 U/L (ref 38–126)
Anion gap: 11 (ref 5–15)
BUN: 13 mg/dL (ref 8–23)
CO2: 23 mmol/L (ref 22–32)
Calcium: 9.9 mg/dL (ref 8.9–10.3)
Chloride: 105 mmol/L (ref 98–111)
Creatinine: 0.87 mg/dL (ref 0.44–1.00)
GFR, Estimated: >60 mL/min
Glucose, Bld: 210 mg/dL — ABNORMAL HIGH (ref 70–99)
Potassium: 3.9 mmol/L (ref 3.5–5.1)
Sodium: 140 mmol/L (ref 135–145)
Total Bilirubin: 0.6 mg/dL (ref 0.0–1.2)
Total Protein: 6.2 g/dL — ABNORMAL LOW (ref 6.5–8.1)

## 2024-12-29 MED ORDER — ACETAMINOPHEN 325 MG PO TABS
650.0000 mg | ORAL_TABLET | Freq: Once | ORAL | Status: AC
  Administered 2024-12-29: 650 mg via ORAL
  Filled 2024-12-29: qty 2

## 2024-12-29 MED ORDER — SODIUM CHLORIDE 0.9 % IV SOLN: Freq: Once | INTRAVENOUS | Status: AC

## 2024-12-29 MED ORDER — PROCHLORPERAZINE MALEATE 10 MG PO TABS
10.0000 mg | ORAL_TABLET | Freq: Once | ORAL | Status: AC
  Administered 2024-12-29: 10 mg via ORAL
  Filled 2024-12-29: qty 1

## 2024-12-29 MED ORDER — DIPHENHYDRAMINE HCL 25 MG PO CAPS
25.0000 mg | ORAL_CAPSULE | Freq: Once | ORAL | Status: AC
  Administered 2024-12-29: 25 mg via ORAL
  Filled 2024-12-29: qty 1

## 2024-12-29 MED ORDER — DEXAMETHASONE 4 MG PO TABS
8.0000 mg | ORAL_TABLET | Freq: Once | ORAL | Status: AC
  Administered 2024-12-29: 8 mg via ORAL
  Filled 2024-12-29: qty 2

## 2024-12-29 MED ORDER — FAMOTIDINE IN NACL 20-0.9 MG/50ML-% IV SOLN
20.0000 mg | Freq: Once | INTRAVENOUS | Status: AC
  Administered 2024-12-29: 20 mg via INTRAVENOUS
  Filled 2024-12-29: qty 50

## 2024-12-29 MED ORDER — DEXTROSE 5 % IV SOLN
36.0000 mg/m2 | Freq: Once | INTRAVENOUS | Status: AC
  Administered 2024-12-29: 60 mg via INTRAVENOUS
  Filled 2024-12-29: qty 30

## 2024-12-29 NOTE — Patient Instructions (Signed)

## 2024-12-29 NOTE — Progress Notes (Signed)
 Still awaiting dental work. No Zometa  today.  Harlene Nasuti, PharmD Oncology Infusion Pharmacist 12/29/2024 2:48 PM

## 2025-01-01 ENCOUNTER — Other Ambulatory Visit: Payer: Self-pay

## 2025-01-01 ENCOUNTER — Encounter: Payer: Self-pay | Admitting: Hematology

## 2025-01-01 ENCOUNTER — Encounter: Payer: Self-pay | Admitting: Nurse Practitioner

## 2025-01-01 DIAGNOSIS — C9000 Multiple myeloma not having achieved remission: Secondary | ICD-10-CM

## 2025-01-01 MED ORDER — LENALIDOMIDE 5 MG PO CAPS: ORAL_CAPSULE | ORAL | 0 refills | Status: AC

## 2025-01-11 ENCOUNTER — Encounter: Payer: Self-pay | Admitting: Hematology

## 2025-01-12 ENCOUNTER — Inpatient Hospital Stay: Admitting: Hematology

## 2025-01-12 ENCOUNTER — Inpatient Hospital Stay

## 2025-01-26 ENCOUNTER — Inpatient Hospital Stay

## 2025-01-26 ENCOUNTER — Inpatient Hospital Stay: Attending: Hematology

## 2025-02-09 ENCOUNTER — Inpatient Hospital Stay

## 2025-02-09 ENCOUNTER — Inpatient Hospital Stay: Admitting: Hematology

## 2025-02-23 ENCOUNTER — Inpatient Hospital Stay: Attending: Hematology

## 2025-02-23 ENCOUNTER — Inpatient Hospital Stay

## 2025-09-13 ENCOUNTER — Ambulatory Visit
